# Patient Record
Sex: Female | Born: 1945 | Race: White | Hispanic: No | Smoking: Current every day smoker
Health system: Southern US, Community
[De-identification: ages and names within clinical notes are randomized; demographics above are authoritative.]

## PROBLEM LIST (undated history)

## (undated) DIAGNOSIS — D649 Anemia, unspecified: Secondary | ICD-10-CM

## (undated) DIAGNOSIS — D329 Benign neoplasm of meninges, unspecified: Secondary | ICD-10-CM

## (undated) DIAGNOSIS — K2970 Gastritis, unspecified, without bleeding: Secondary | ICD-10-CM

## (undated) DIAGNOSIS — I739 Peripheral vascular disease, unspecified: Secondary | ICD-10-CM

## (undated) DIAGNOSIS — I639 Cerebral infarction, unspecified: Secondary | ICD-10-CM

## (undated) DIAGNOSIS — I679 Cerebrovascular disease, unspecified: Secondary | ICD-10-CM

## (undated) DIAGNOSIS — K219 Gastro-esophageal reflux disease without esophagitis: Secondary | ICD-10-CM

## (undated) DIAGNOSIS — Z972 Presence of dental prosthetic device (complete) (partial): Secondary | ICD-10-CM

## (undated) DIAGNOSIS — I714 Abdominal aortic aneurysm, without rupture, unspecified: Secondary | ICD-10-CM

## (undated) DIAGNOSIS — E039 Hypothyroidism, unspecified: Secondary | ICD-10-CM

## (undated) DIAGNOSIS — I10 Essential (primary) hypertension: Secondary | ICD-10-CM

## (undated) DIAGNOSIS — M1990 Unspecified osteoarthritis, unspecified site: Secondary | ICD-10-CM

## (undated) DIAGNOSIS — K599 Functional intestinal disorder, unspecified: Secondary | ICD-10-CM

## (undated) DIAGNOSIS — K2980 Duodenitis without bleeding: Secondary | ICD-10-CM

## (undated) DIAGNOSIS — Z973 Presence of spectacles and contact lenses: Secondary | ICD-10-CM

## (undated) DIAGNOSIS — R9431 Abnormal electrocardiogram [ECG] [EKG]: Secondary | ICD-10-CM

## (undated) DIAGNOSIS — K3533 Acute appendicitis with perforation and localized peritonitis, with abscess: Secondary | ICD-10-CM

## (undated) DIAGNOSIS — F329 Major depressive disorder, single episode, unspecified: Secondary | ICD-10-CM

## (undated) DIAGNOSIS — I2510 Atherosclerotic heart disease of native coronary artery without angina pectoris: Secondary | ICD-10-CM

## (undated) DIAGNOSIS — L409 Psoriasis, unspecified: Secondary | ICD-10-CM

## (undated) DIAGNOSIS — F319 Bipolar disorder, unspecified: Secondary | ICD-10-CM

## (undated) DIAGNOSIS — F32A Depression, unspecified: Secondary | ICD-10-CM

## (undated) HISTORY — DX: Gastro-esophageal reflux disease without esophagitis: K21.9

## (undated) HISTORY — DX: Psoriasis, unspecified: L40.9

## (undated) HISTORY — DX: Abnormal electrocardiogram (ECG) (EKG): R94.31

## (undated) HISTORY — DX: Major depressive disorder, single episode, unspecified: F32.9

## (undated) HISTORY — DX: Cerebral infarction, unspecified: I63.9

## (undated) HISTORY — DX: Essential (primary) hypertension: I10

## (undated) HISTORY — DX: Gastritis, unspecified, without bleeding: K29.70

## (undated) HISTORY — DX: Duodenitis without bleeding: K29.80

## (undated) HISTORY — PX: MULTIPLE TOOTH EXTRACTIONS: SHX2053

## (undated) HISTORY — DX: Bipolar disorder, unspecified: F31.9

## (undated) HISTORY — PX: TUBAL LIGATION: SHX77

## (undated) HISTORY — PX: COLONOSCOPY: SHX174

## (undated) HISTORY — DX: Peripheral vascular disease, unspecified: I73.9

## (undated) HISTORY — DX: Depression, unspecified: F32.A

## (undated) HISTORY — PX: CATARACT EXTRACTION W/ INTRAOCULAR LENS  IMPLANT, BILATERAL: SHX1307

## (undated) HISTORY — DX: Functional intestinal disorder, unspecified: K59.9

## (undated) HISTORY — DX: Benign neoplasm of meninges, unspecified: D32.9

## (undated) HISTORY — DX: Hypothyroidism, unspecified: E03.9

---

## 2005-03-07 ENCOUNTER — Ambulatory Visit: Payer: Self-pay | Admitting: Internal Medicine

## 2005-03-21 ENCOUNTER — Ambulatory Visit: Payer: Self-pay | Admitting: Internal Medicine

## 2005-05-01 ENCOUNTER — Ambulatory Visit: Payer: Self-pay

## 2005-05-08 ENCOUNTER — Other Ambulatory Visit: Admission: RE | Admit: 2005-05-08 | Discharge: 2005-05-08 | Payer: Self-pay | Admitting: Obstetrics & Gynecology

## 2005-05-17 ENCOUNTER — Encounter: Admission: RE | Admit: 2005-05-17 | Discharge: 2005-05-17 | Payer: Self-pay | Admitting: Internal Medicine

## 2005-07-12 ENCOUNTER — Ambulatory Visit: Payer: Self-pay | Admitting: Internal Medicine

## 2006-08-30 ENCOUNTER — Ambulatory Visit: Payer: Self-pay | Admitting: Internal Medicine

## 2006-08-30 ENCOUNTER — Inpatient Hospital Stay (HOSPITAL_COMMUNITY): Admission: EM | Admit: 2006-08-30 | Discharge: 2006-08-31 | Payer: Self-pay | Admitting: Emergency Medicine

## 2006-08-30 ENCOUNTER — Ambulatory Visit: Payer: Self-pay | Admitting: Cardiology

## 2006-09-03 ENCOUNTER — Ambulatory Visit: Payer: Self-pay | Admitting: Internal Medicine

## 2006-09-17 ENCOUNTER — Ambulatory Visit: Payer: Self-pay | Admitting: Gastroenterology

## 2006-09-20 ENCOUNTER — Ambulatory Visit: Payer: Self-pay | Admitting: Cardiology

## 2006-10-05 ENCOUNTER — Ambulatory Visit: Payer: Self-pay | Admitting: Internal Medicine

## 2007-03-08 ENCOUNTER — Ambulatory Visit: Payer: Self-pay | Admitting: Internal Medicine

## 2007-03-08 DIAGNOSIS — I10 Essential (primary) hypertension: Secondary | ICD-10-CM | POA: Insufficient documentation

## 2007-03-08 DIAGNOSIS — F329 Major depressive disorder, single episode, unspecified: Secondary | ICD-10-CM

## 2007-03-08 DIAGNOSIS — F32A Depression, unspecified: Secondary | ICD-10-CM | POA: Insufficient documentation

## 2007-10-28 ENCOUNTER — Ambulatory Visit: Payer: Self-pay | Admitting: Internal Medicine

## 2008-01-09 ENCOUNTER — Ambulatory Visit: Payer: Self-pay | Admitting: Internal Medicine

## 2008-01-10 LAB — CONVERTED CEMR LAB
Basophils Relative: 0.7 % (ref 0.0–1.0)
Lymphocytes Relative: 35.6 % (ref 12.0–46.0)
Monocytes Relative: 13.9 % — ABNORMAL HIGH (ref 3.0–12.0)
Neutro Abs: 2.4 10*3/uL (ref 1.4–7.7)
RDW: 13.4 % (ref 11.5–14.6)

## 2008-01-14 ENCOUNTER — Encounter (INDEPENDENT_AMBULATORY_CARE_PROVIDER_SITE_OTHER): Payer: Self-pay | Admitting: *Deleted

## 2008-01-16 ENCOUNTER — Telehealth (INDEPENDENT_AMBULATORY_CARE_PROVIDER_SITE_OTHER): Payer: Self-pay | Admitting: *Deleted

## 2008-01-27 ENCOUNTER — Telehealth: Payer: Self-pay | Admitting: Internal Medicine

## 2008-01-28 ENCOUNTER — Ambulatory Visit (HOSPITAL_COMMUNITY): Admission: RE | Admit: 2008-01-28 | Discharge: 2008-01-28 | Payer: Self-pay | Admitting: Internal Medicine

## 2008-01-28 DIAGNOSIS — R110 Nausea: Secondary | ICD-10-CM

## 2008-01-29 DIAGNOSIS — K5909 Other constipation: Secondary | ICD-10-CM | POA: Insufficient documentation

## 2008-01-30 ENCOUNTER — Ambulatory Visit: Payer: Self-pay | Admitting: Internal Medicine

## 2008-02-06 ENCOUNTER — Ambulatory Visit: Payer: Self-pay | Admitting: Internal Medicine

## 2008-02-07 ENCOUNTER — Encounter: Admission: RE | Admit: 2008-02-07 | Discharge: 2008-02-07 | Payer: Self-pay | Admitting: Internal Medicine

## 2008-02-10 ENCOUNTER — Telehealth: Payer: Self-pay | Admitting: Internal Medicine

## 2008-03-30 ENCOUNTER — Telehealth: Payer: Self-pay | Admitting: Internal Medicine

## 2008-08-11 ENCOUNTER — Ambulatory Visit: Payer: Self-pay | Admitting: Family Medicine

## 2009-08-02 ENCOUNTER — Ambulatory Visit: Payer: Self-pay | Admitting: Internal Medicine

## 2009-08-02 LAB — CONVERTED CEMR LAB: TSH: 2.13 microintl units/mL (ref 0.35–5.50)

## 2009-10-04 ENCOUNTER — Encounter: Payer: Self-pay | Admitting: Internal Medicine

## 2009-10-04 ENCOUNTER — Telehealth: Payer: Self-pay | Admitting: Internal Medicine

## 2009-11-05 ENCOUNTER — Encounter: Payer: Self-pay | Admitting: Internal Medicine

## 2009-11-05 ENCOUNTER — Encounter: Admission: RE | Admit: 2009-11-05 | Discharge: 2009-11-05 | Payer: Self-pay | Admitting: Internal Medicine

## 2009-11-08 ENCOUNTER — Telehealth: Payer: Self-pay | Admitting: Internal Medicine

## 2009-11-22 ENCOUNTER — Ambulatory Visit: Payer: Self-pay | Admitting: Internal Medicine

## 2009-11-22 DIAGNOSIS — M549 Dorsalgia, unspecified: Secondary | ICD-10-CM | POA: Insufficient documentation

## 2009-11-23 ENCOUNTER — Ambulatory Visit: Payer: Self-pay | Admitting: Internal Medicine

## 2009-11-29 ENCOUNTER — Encounter: Payer: Self-pay | Admitting: Internal Medicine

## 2009-11-29 ENCOUNTER — Telehealth (INDEPENDENT_AMBULATORY_CARE_PROVIDER_SITE_OTHER): Payer: Self-pay | Admitting: *Deleted

## 2009-12-01 ENCOUNTER — Telehealth (INDEPENDENT_AMBULATORY_CARE_PROVIDER_SITE_OTHER): Payer: Self-pay | Admitting: *Deleted

## 2009-12-01 ENCOUNTER — Telehealth: Payer: Self-pay | Admitting: Internal Medicine

## 2009-12-02 ENCOUNTER — Ambulatory Visit: Payer: Self-pay | Admitting: Internal Medicine

## 2009-12-02 DIAGNOSIS — R519 Headache, unspecified: Secondary | ICD-10-CM | POA: Insufficient documentation

## 2009-12-02 DIAGNOSIS — R51 Headache: Secondary | ICD-10-CM

## 2009-12-03 ENCOUNTER — Encounter: Admission: RE | Admit: 2009-12-03 | Discharge: 2009-12-03 | Payer: Self-pay | Admitting: Internal Medicine

## 2009-12-10 ENCOUNTER — Ambulatory Visit: Payer: Self-pay | Admitting: Internal Medicine

## 2009-12-13 LAB — CONVERTED CEMR LAB
Creatinine, Ser: 0.8 mg/dL (ref 0.4–1.2)
GFR calc non Af Amer: 76.24 mL/min (ref >60)
Glucose, Bld: 81 mg/dL (ref 70–99)
Sodium: 141 meq/L (ref 135–145)

## 2009-12-16 ENCOUNTER — Encounter: Admission: RE | Admit: 2009-12-16 | Discharge: 2009-12-16 | Payer: Self-pay | Admitting: Internal Medicine

## 2009-12-23 ENCOUNTER — Encounter: Payer: Self-pay | Admitting: Internal Medicine

## 2009-12-23 ENCOUNTER — Telehealth: Payer: Self-pay | Admitting: Internal Medicine

## 2009-12-28 ENCOUNTER — Telehealth (INDEPENDENT_AMBULATORY_CARE_PROVIDER_SITE_OTHER): Payer: Self-pay | Admitting: *Deleted

## 2009-12-28 ENCOUNTER — Encounter: Payer: Self-pay | Admitting: Internal Medicine

## 2010-01-03 ENCOUNTER — Telehealth (INDEPENDENT_AMBULATORY_CARE_PROVIDER_SITE_OTHER): Payer: Self-pay | Admitting: *Deleted

## 2010-01-06 ENCOUNTER — Encounter (INDEPENDENT_AMBULATORY_CARE_PROVIDER_SITE_OTHER): Payer: Self-pay | Admitting: *Deleted

## 2010-01-10 ENCOUNTER — Telehealth (INDEPENDENT_AMBULATORY_CARE_PROVIDER_SITE_OTHER): Payer: Self-pay | Admitting: *Deleted

## 2010-01-11 ENCOUNTER — Encounter: Payer: Self-pay | Admitting: Internal Medicine

## 2010-01-13 ENCOUNTER — Telehealth: Payer: Self-pay | Admitting: Internal Medicine

## 2010-01-14 ENCOUNTER — Encounter: Admission: RE | Admit: 2010-01-14 | Discharge: 2010-01-14 | Payer: Self-pay | Admitting: Neurology

## 2010-01-19 ENCOUNTER — Ambulatory Visit: Payer: Self-pay | Admitting: Internal Medicine

## 2010-01-19 LAB — CONVERTED CEMR LAB
LDL Cholesterol: 114 mg/dL — ABNORMAL HIGH (ref 0–99)
Total CHOL/HDL Ratio: 3
VLDL: 9.4 mg/dL (ref 0.0–40.0)

## 2010-01-21 ENCOUNTER — Encounter: Payer: Self-pay | Admitting: Internal Medicine

## 2010-01-21 ENCOUNTER — Telehealth: Payer: Self-pay | Admitting: Internal Medicine

## 2010-04-11 HISTORY — PX: OTHER SURGICAL HISTORY: SHX169

## 2010-04-14 ENCOUNTER — Ambulatory Visit: Payer: Self-pay | Admitting: Family Medicine

## 2010-04-19 LAB — CONVERTED CEMR LAB: Thyroglobulin Ab: <20 (ref <40.0)

## 2010-07-12 ENCOUNTER — Telehealth: Payer: Self-pay | Admitting: Internal Medicine

## 2010-07-25 ENCOUNTER — Telehealth (INDEPENDENT_AMBULATORY_CARE_PROVIDER_SITE_OTHER): Payer: Self-pay | Admitting: *Deleted

## 2010-08-12 ENCOUNTER — Telehealth: Payer: Self-pay | Admitting: Internal Medicine

## 2010-08-15 ENCOUNTER — Encounter: Payer: Self-pay | Admitting: Internal Medicine

## 2010-08-15 ENCOUNTER — Ambulatory Visit: Payer: Self-pay | Admitting: Internal Medicine

## 2010-08-15 DIAGNOSIS — R079 Chest pain, unspecified: Secondary | ICD-10-CM | POA: Insufficient documentation

## 2010-08-15 DIAGNOSIS — I6529 Occlusion and stenosis of unspecified carotid artery: Secondary | ICD-10-CM | POA: Insufficient documentation

## 2010-08-16 ENCOUNTER — Encounter: Payer: Self-pay | Admitting: Internal Medicine

## 2010-08-17 ENCOUNTER — Encounter: Payer: Self-pay | Admitting: Internal Medicine

## 2010-08-19 LAB — CONVERTED CEMR LAB
Basophils Relative: 0.3 % (ref 0.0–3.0)
Calcium: 9.2 mg/dL (ref 8.4–10.5)
Creatinine, Ser: 0.7 mg/dL (ref 0.4–1.2)
Direct LDL: 135.6 mg/dL
Eosinophils Absolute: 0.1 10*3/uL (ref 0.0–0.7)
Eosinophils Relative: 1.9 % (ref 0.0–5.0)
Glucose, Bld: 90 mg/dL (ref 70–99)
HDL: 64.4 mg/dL (ref >39.00)
Lymphocytes Relative: 35.9 % (ref 12.0–46.0)
Lymphs Abs: 2.1 10*3/uL (ref 0.7–4.0)
Monocytes Absolute: 0.5 10*3/uL (ref 0.1–1.0)
Neutro Abs: 3.1 10*3/uL (ref 1.4–7.7)
Platelets: 226 10*3/uL (ref 150.0–400.0)
Potassium: 4.5 meq/L (ref 3.5–5.1)
RDW: 14.8 % — ABNORMAL HIGH (ref 11.5–14.6)
Total CHOL/HDL Ratio: 4
Triglycerides: 96 mg/dL (ref 0.0–149.0)
WBC: 5.8 10*3/uL (ref 4.5–10.5)

## 2010-08-24 ENCOUNTER — Encounter: Payer: Self-pay | Admitting: Internal Medicine

## 2010-08-29 ENCOUNTER — Encounter (HOSPITAL_COMMUNITY): Admission: RE | Admit: 2010-08-29 | Payer: Self-pay | Source: Home / Self Care | Admitting: Internal Medicine

## 2010-08-29 ENCOUNTER — Ambulatory Visit: Payer: Self-pay

## 2010-09-20 ENCOUNTER — Telehealth: Payer: Self-pay | Admitting: Internal Medicine

## 2010-09-20 ENCOUNTER — Encounter (INDEPENDENT_AMBULATORY_CARE_PROVIDER_SITE_OTHER): Payer: Self-pay | Admitting: *Deleted

## 2010-09-23 ENCOUNTER — Ambulatory Visit
Admission: RE | Admit: 2010-09-23 | Discharge: 2010-09-23 | Payer: Self-pay | Source: Home / Self Care | Attending: Internal Medicine | Admitting: Internal Medicine

## 2010-09-23 DIAGNOSIS — M1990 Unspecified osteoarthritis, unspecified site: Secondary | ICD-10-CM | POA: Insufficient documentation

## 2010-10-02 ENCOUNTER — Encounter: Payer: Self-pay | Admitting: Internal Medicine

## 2010-10-09 LAB — CONVERTED CEMR LAB
ALT: 15 units/L
Alkaline Phosphatase: 60 units/L
Hemoglobin: 12.2 g/dL
RBC count: 4.15 10*6/uL
WBC, blood: 5.2 10*3/uL
platelet count: 186 10*3/uL

## 2010-10-11 NOTE — Progress Notes (Signed)
  Phone Note Other Incoming   Summary of Call: Patient appeared requesting copies of her MRI's, [facility] Radiation protection practitioner. Discs were comprised of MRA NECK, MR BRAIN, MRA HEAD, DG LUMBAR SPINE COMPLETE AND CT VIRTUAL COLONSCOPY. Patient picked up.

## 2010-10-11 NOTE — Assessment & Plan Note (Signed)
Summary: pain on right/kdc   Vital Signs:  Patient profile:   65 year old female Height:      69 inches Weight:      188.4 pounds BMI:     27.92 Temp:     98 degrees F BP sitting:   140 / 80  Vitals Entered By: Shary Decamp (November 22, 2009 4:08 PM) CC: low back pain on rt side, down rt leg off & on x 2 months - worse x few weeks   History of Present Illness: two  months history of on-and-off pain on the right lower back pain is worse when she first gets up, as the day goes by,  pain decreases pain radiates somehow to the right knee and right inner pretibial area no injury or previous surgery in the back in the past she have left sided back pain but no right-sided pain  Current Medications (verified): 1)  Depakote 500 Mg Tbec (Divalproex Sodium) .... 2 By Mouth Once Daily 2)  Cymbalta 60 Mg Cpep (Duloxetine Hcl) .... Take 1 Capsule By Mouth Once A Day 3)  Clonazepam 2 Mg Tabs (Clonazepam) .Marland Kitchen.. 1 Tablet By Mouth At Bedtime 4)  Requip 5)  Seroquel .... 1/4 At At Bedtime  Allergies (verified): No Known Drug Allergies  Past History:  Past Medical History: Depression-- sees psych Hypertension  Past Surgical History: Reviewed history from 01/29/2008 and no changes required. Tubal Ligation  Social History: Reviewed history from 08/02/2009 and no changes required. Occupation: Self-Employed quit tobacco June 2010 aprox, on the E-cigarrett  Alcohol Use - yes-on occasion Daily Caffeine Use  Review of Systems       denies bladder or bowel incontinence no rash anywhere in the back no lower extremity paresthesias  Physical Exam  General:  alert, well-developed, and well-nourished.   Msk:  slightly tender to palpation in the right sacroiliac area Extremities:  no edema Neurologic:  strength normal in the lower extremities reflexes symmetric in the lower extremities, ankle jerk slightly decreased on the right straight leg test negative   Impression &  Recommendations:  Problem # 1:  BACK PAIN (ICD-724.5) Assessment New subacute right back pain, some radicular features Plan: X-ray pain control  see instructions   ortho referral    Orders: T-Lumbar Spine Complete, 5 Views (16109UE) Orthopedic Referral (Ortho)  Complete Medication List: 1)  Depakote 500 Mg Tbec (Divalproex sodium) .... 2 by mouth once daily 2)  Cymbalta 60 Mg Cpep (Duloxetine hcl) .... Take 1 capsule by mouth once a day 3)  Clonazepam 2 Mg Tabs (Clonazepam) .Marland Kitchen.. 1 tablet by mouth at bedtime 4)  Requip  5)  Seroquel  .... 1/4 at at bedtime  Patient Instructions: 1)  Take  1000 mg of tylenol every 6 hours as needed for relief of pain  2)  Avoid taking more than 4000 mg in a 24 hour period( can cause liver damage in higher doses) 3)  XR

## 2010-10-11 NOTE — Miscellaneous (Signed)
Summary: Virtual Colonoscopy Order  Clinical Lists Changes  Orders: Added new Test order of Virtual Colon (Virt Col) - Signed

## 2010-10-11 NOTE — Progress Notes (Signed)
Summary: cholesterol: Start simvastatin check LFTs  Phone Note Outgoing Call   Summary of Call: cholesterol panel reviewed, LDL is 114, goal 100. Needs simvastatin 20 mg one p.o. q.h.s. Check LFTs before she starts simvastatin Fax results to neurology OV  in 6 weeks Geno Sydnor E. Taria Castrillo MD  Jan 21, 2010 11:28 AM   Follow-up for Phone Call        discussed with pt -- copy faxed to Dr. Clarisse Gouge -- [phone], pt states she had LFTs done @ Derm.  Copy of labs requested COPY MAILED TO PT [name]  Jan 21, 2010 11:39 AM     New/Updated Medications: SIMVASTATIN 20 MG TABS (SIMVASTATIN) 1 by mouth at bedtime - DUE OFFICE VISIT WITH DR Drue Novel 7/11 Prescriptions: SIMVASTATIN 20 MG TABS (SIMVASTATIN) 1 by mouth at bedtime - DUE OFFICE VISIT WITH DR Drue Novel 7/11  #30 x 2   Entered by:   [name]   Authorized by:   Nolon Rod. Beckhem Isadore MD   Signed by:   [name] on 01/21/2010   Method used:   Electronically to        [id number]* (retail)       [address]       [place], [place]  [address]       Ph: [phone]       Fax: [phone]   RxID:   [id number]

## 2010-10-11 NOTE — Assessment & Plan Note (Signed)
Summary: CPX//ph   Vital Signs:  Patient profile:   65 year old female Weight:      184.50 pounds Pulse rate:   61 / minute Pulse rhythm:   regular BP sitting:   128 / 82  (left arm) Cuff size:   large  Vitals Entered By: Army Fossa CMA (August 15, 2010 1:17 PM) CC: Pt here a f/u. - fasting  Comments c.o dizziness and nausea x 6 months referral to gyn Fasting [facility] [address]    CC:  Pt here a f/u. - fasting .  History of Present Illness: Here for Medicare AWV:  1.   Risk factors based on Past M, S, F history: reviewed  2.   Physical Activities: not very active, had knee surgery  ~ 04-2010 but still walks the dog 1 mile a                day  3.   Depression/mood: symptoms are not as well controlled recently , sees  psychiatry  4.   Hearing: hearing very good  5.   ADL's: totally independent  6.   Fall Risk: no recent falls, low risk  7.   Home Safety: does feel safe at home  8.   Height, weight, &visual acuity: see VS, uses glasses but vision not good ,  needs to see the                      eye doctor    d/t  cataracts  9.   Counseling:  see a/p  10.   Labs ordered based on risk factors: yes  11.           Referral Coordination, if needed  12.           Care Plan, see a/p  13.            Cognitive Assessment-- motor skill , memory and cognition seem appropiate   in addition we discussed the folowing: dizziness --  occasionally dizzy when she stands up, "need to hold onto something" nausea-- chromic per patient "all my life", states that phenergan  helps, requests a refill HTN-- ambulatory BPs  better than before , occasionally goes to 140 skin psoriasis, sees Dr Londell Moh, on MTX    Current Medications (verified): 1)  Depakote 500 Mg Tbec (Divalproex Sodium) .... 2 By Mouth Once Daily 2)  Cymbalta 60 Mg Cpep (Duloxetine Hcl) .... Take 1 Capsule By Mouth Once A Day 3)  Clonazepam 2 Mg Tabs (Clonazepam) .Marland Kitchen.. 1 Tablet By Mouth At Bedtime 4)  Requip 5)   Promethazine Hcl 25 Mg  Tabs (Promethazine Hcl) .... Take 1 Q6 As Needed For Nausea/vomiting 6)  Methotrexate 2.5 Mg Tabs (Methotrexate Sodium)  Allergies (verified): No Known Drug Allergies  Past History:  Past Medical History: Depression-- sees psych Hypertension chronic constipation  skin psoriasis , sees derm  Carotid artery dz:  intra-and extracranial  vascular dz per MRI  4/11 ,  neurology rec. strict CVRF control 2006-[place]: had EKG changes, stress test negative (" false EKG changes")  Past Surgical History: Tubal Ligation R knee arthroscopy  ~ 8-11   Family History: Breast Cancer: Mother (Metastisis to bones) colon ca--no  Diabetes: Son Heart Disease: GF  strokes--no  Alcoholism: Brother    Social History: married retired 1 son  quit tobacco June 2010 aprox, on the E-cigarrett  Alcohol Use - yes-on occasion Daily Caffeine Use  Review of Systems CV:  Denies swelling of feet; 2 episodes of chest pain in the last 2 months. Described as "somebody sitting in my chest", some radiation to the left arm, usually triggered by emotional distress, not exertional, not associated with nausea, diaphoresis. It is associated with headache. Episodes last a few minutes. Resp:  Denies cough and shortness of breath. GI:  no heartburn no dysphagia or odynophagia .  Physical Exam  General:  alert and well-developed.   Chest Wall:  no deformities and no tenderness.   Lungs:  normal respiratory effort, no intercostal retractions, no accessory muscle use, and normal breath sounds.   Heart:  normal rate, regular rhythm, and no murmur.   Abdomen:  soft, non-tender, no distention, no masses, no guarding, and no rigidity.   Extremities:  no lower extremity edema   Impression & Recommendations:  Problem # 1:  ROUTINE GENERAL MEDICAL EXAM@HEALTH  CARE FACL (ICD-V70.0) Td-- not in years , declined pneumonia shot -- never no flu shot shingles shot discussed  explained benefits of  shots-----declined all ,  except  shingles   Cscope--- per GI   had a DEXA years ago, results?------ordering one  needs gyn referal,  last PAP years ago last MMG years ago ----------------ordering one  diet and exercise discussed  Orders: Radiology Referral (Radiology) Gynecologic Referral (Gyn) Radiology Referral (Radiology) Medicare -1st Annual Wellness Visit [phone])  Problem # 2:  CHEST PAIN (ICD-786.50) Assessment: New 65 year old female with  chest pain  EKG-- nsr  Chest x-ray start aspirin ER if symptoms severe Stress test  Orders: T-2 View CXR (71020TC) EKG w/ Interpretation (93000) Cardiology Referral (Cardiology)  Problem # 3:  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 Saw  neurology 5-11, they recommend cardiovascular risk factor control  Her updated medication list for this problem includes:    Aspirin 81 Mg Tbec (Aspirin) .Marland Kitchen... 1 by mouth daily  Orders: TLB-BMP (Basic Metabolic Panel-BMET) (80048-METABOL) TLB-Lipid Panel (80061-LIPID) Specimen Handling (09811)  Problem # 4:  HYPERTENSION (ICD-401.9) history of hypertension with normal BPs BP today: 128/82 Prior BP: 124/72 (04/14/2010)  Labs Reviewed: K+: 3.6 (12/10/2009) Creat: : 0.8 (12/10/2009)   Chol: 199 (01/19/2010)   HDL: 75.60 (01/19/2010)   LDL: 114 (01/19/2010)   TG: 47.0 (01/19/2010)  Orders: Venipuncture (91478) TLB-CBC Platelet - w/Differential (85025-CBCD)  Problem # 5:  NAUSEA (ICD-787.02) chronic nausea, Phenergan helps. I agreed to prescribe her very small doses Her updated medication list for this problem includes:    Promethazine Hcl 25 Mg Tabs (Promethazine hcl) .Marland Kitchen... Take 1 q6 as needed for nausea/vomiting  Problem # 6:  in addition to her CPX  we spent > 30 min w/ patient due to CP and also reviewing her chart   Complete Medication List: 1)  Depakote 500 Mg Tbec (Divalproex sodium) .... 2 by mouth once daily 2)  Cymbalta 60 Mg Cpep  (Duloxetine hcl) .... Take 1 capsule by mouth once a day 3)  Clonazepam 2 Mg Tabs (Clonazepam) .Marland Kitchen.. 1 tablet by mouth at bedtime 4)  Requip  5)  Promethazine Hcl 25 Mg Tabs (Promethazine hcl) .... Take 1 q6 as needed for nausea/vomiting 6)  Methotrexate 2.5 Mg Tabs (Methotrexate sodium) 7)  Aspirin 81 Mg Tbec (Aspirin) .Marland Kitchen.. 1 by mouth daily  Other Orders: T-Vitamin D (25-Hydroxy) [phone]) Zoster (Shingles) Vaccine Live [phone]) Admin 1st Vaccine (96295)  Patient Instructions: 1)  Please schedule a follow-up appointment in 4 months .  Prescriptions: PROMETHAZINE HCL 25 MG  TABS (PROMETHAZINE HCL) take 1  Q6 as needed for nausea/vomiting  #40 x 0   Entered by:   Lucious Groves CMA   Authorized by:   Nolon Rod. Paz MD   Signed by:   Lucious Groves CMA on 08/15/2010   Method used:   Telephoned to ...       [facility] [phone]* (retail)       [address]       [place], [place]  [address]       Ph: [phone]       Fax: [phone]   RxID:   [id number]    Orders Added: 1)  Venipuncture [59563] 2)  TLB-CBC Platelet - w/Differential [85025-CBCD] 3)  TLB-BMP (Basic Metabolic Panel-BMET) [80048-METABOL] 4)  TLB-Lipid Panel [80061-LIPID] 5)  T-Vitamin D (25-Hydroxy) [87564-33295] 6)  T-2 View CXR [71020TC] 7)  Specimen Handling [99000] 8)  Zoster (Shingles) Vaccine Live [90736] 9)  Admin 1st Vaccine [90471] 10)  EKG w/ Interpretation [93000] 11)  Cardiology Referral [Cardiology] 12)  Radiology Referral [Radiology] 13)  Gynecologic Referral [Gyn] 14)  Radiology Referral [Radiology] 15)  Est. Patient Level IV [18841] 16)  Medicare -1st Annual Wellness Visit [G0438]   Immunization History:  Influenza Immunization History:    Influenza:  declined , explained benefits (08/15/2010)  Immunizations Administered:  Zostavax # 1:    Vaccine Type: Zostavax    Site: right deltoid    Mfr: Merck    Dose: 0.94ml    Route: [place]    Given  by: Army Fossa CMA    Exp. Date: 07/13/2011    Lot #: [id number]   Immunization History:  Influenza Immunization History:    Influenza:  declined , explained benefits (08/15/2010)  Immunizations Administered:  Zostavax # 1:    Vaccine Type: Zostavax    Site: right deltoid    Mfr: Merck    Dose: 0.34ml    Route: [place]    Given by: Army Fossa CMA    Exp. Date: 07/13/2011    Lot #: [id number]

## 2010-10-11 NOTE — Assessment & Plan Note (Signed)
Summary: possible food poision/paz pt//kn   Vital Signs:  Patient profile:   65 year old female O2 Sat:      93 % on Room air Temp:     98.0 degrees F oral BP sitting:   124 / 72  (left arm)  Vitals Entered By: Doristine Devoid CMA (April 14, 2010 11:53 AM)  O2 Flow:  Room air CC: vomiting xyest. after eating [place] sandwich with nausea and stomach pains    History of Present Illness: 65 yo woman here today w/ vomiting.  went to [facility] Tuesday yesterday and had a [place] Burger and 'rotten' lemon in iced tea.  90 minutes after eating felt urge to both vomit and have diarrhea.  had diarrhea (but also took a laxative).  made herself vomit, had vomiting x3.  'excrutiating' abd pain.  nausea persisted throughout the day.  unable to sleep last night.  this AM, 'i'm ok except for a little headache'.  ate eggs this AM and has kept this down.  no fevers.    Saw MD in [place] and has letter that Thyroid and blood sugars are 'borderline'.  MD recommended thyroid antibody test for TSH >2.5 w/ hypothyroid sxs of fatigue, constipation, thinning hair, mood swings/depressed mood.  pt's TSH was 3.109  Current Medications (verified): 1)  Depakote 500 Mg Tbec (Divalproex Sodium) .... 2 By Mouth Once Daily 2)  Cymbalta 60 Mg Cpep (Duloxetine Hcl) .... Take 1 Capsule By Mouth Once A Day 3)  Clonazepam 2 Mg Tabs (Clonazepam) .Marland Kitchen.. 1 Tablet By Mouth At Bedtime 4)  Requip 5)  Seroquel .... 1/4 At At Bedtime 6)  Simvastatin 20 Mg Tabs (Simvastatin) .Marland Kitchen.. 1 By Mouth At Bedtime - Due Office Visit With Dr Drue Novel 7/11 7)  Promethazine Hcl 25 Mg  Tabs (Promethazine Hcl) .... Take 1 Q6 As Needed For Nausea/vomiting  Allergies (verified): No Known Drug Allergies  Review of Systems      See HPI  Physical Exam  General:  alert and well-developed.   Mouth:  MMM Neck:  fullness to neck w/out obvious nodule Lungs:  Normal respiratory effort, chest expands symmetrically. Lungs are clear to auscultation, no crackles or  wheezes. Heart:  reg S1/S2 Abdomen:  soft, NT/ND, +BS, no rebound/guarding   Impression & Recommendations:  Problem # 1:  VOMITING (ICD-787.03) Assessment New  pt's sxs more consistent w/ viral illness than food poisoning.  illness is resolving spontaneously- pt tolerating oral intake and no longer vomiting, no diarrhea.  no evidence of dehydration.  reviewed supportive care and red flags that should prompt return.  Pt expresses understanding and is in agreement w/ this plan.  Orders: Prescription Created Electronically [phone])  Problem # 2:  THYROMEGALY (ICD-240.9) Assessment: New given pt's generalized neck fullness and her discussion w/ MD in [place], will order labs pt desires. Orders: Venipuncture ([address]) T- * Misc. Laboratory test [phone]) Specimen Handling (22025)  Complete Medication List: 1)  Depakote 500 Mg Tbec (Divalproex sodium) .... 2 by mouth once daily 2)  Cymbalta 60 Mg Cpep (Duloxetine hcl) .... Take 1 capsule by mouth once a day 3)  Clonazepam 2 Mg Tabs (Clonazepam) .Marland Kitchen.. 1 tablet by mouth at bedtime 4)  Requip  5)  Seroquel  .... 1/4 at at bedtime 6)  Simvastatin 20 Mg Tabs (Simvastatin) .Marland Kitchen.. 1 by mouth at bedtime - due office visit with dr Drue Novel 7/11 7)  Promethazine Hcl 25 Mg Tabs (Promethazine hcl) .... Take 1 q6 as needed for nausea/vomiting  Patient Instructions: 1)  This doesn't appear to be Salmonella (thank goodness!) 2)  Make sure you are drinking plenty of fluids to avoid dehydration 3)  Use the Promethazine as needed for nausea 4)  If your symptoms change or worsen (blood in stool, fever, unstoppable vomiting)- please call 5)  Hang in there! Prescriptions: PROMETHAZINE HCL 25 MG  TABS (PROMETHAZINE HCL) take 1 Q6 as needed for nausea/vomiting  #20 x 0   Entered and Authorized by:   Neena Rhymes MD   Signed by:   Neena Rhymes MD on 04/14/2010   Method used:   Electronically to        [facility] #[id number]* (retail)       [address]       [place], [place]  [address]       Ph: [phone]       Fax: [phone]   RxID:   [id number]

## 2010-10-11 NOTE — Assessment & Plan Note (Signed)
Summary: pain/swh   Vital Signs:  Patient profile:   65 year old female Height:      69 inches Weight:      184.4 pounds BMI:     27.33 Pulse rate:   66 / minute BP sitting:   150 / 90  Vitals Entered By: R.Peeler CC: back/neck pain Comments sharp back/neck/head pain when bears down for BM-x 3 mnths lasts 30 sec.-"unbearabble" back of head is sore, tender to touch   History of Present Illness: here today because for the last 3 months, she has noted severe headaches when is straining with a bowel movement:  tipycally the pain starts at  the  upper, posterior, left neck and then involves the whole head;  it is quite intense, last 30 seconds and then goes  away the pain does not radiate to her upper extremities other activities that create a  Valsalva maneuver may trigger the headache as well  review of systems denies nausea or vomiting no upper or lower extremity paresthesias no loss of consciousness  Allergies: No Known Drug Allergies  Past History:  Past Medical History: Reviewed history from 11/22/2009 and no changes required. Depression-- sees psych Hypertension  Past Surgical History: Reviewed history from 01/29/2008 and no changes required. Tubal Ligation  Social History: Reviewed history from 11/22/2009 and no changes required. Occupation: Self-Employed quit tobacco June 2010 aprox, on the E-cigarrett  Alcohol Use - yes-on occasion Daily Caffeine Use  Physical Exam  General:  alert and well-developed.   Neck:  essentially full range of motion Extremities:  not edema Neurologic:  alert & oriented X3, cranial nerves II-XII intact, strength normal in all extremities, gait normal, and DTRs symmetrical and normal.     Impression & Recommendations:  Problem # 1:  HEADACHE (ICD-784.0)  the patient has any onset of headache associated with a Valsalva maneuvers serious  underlying etiologies need to be ruled out, doubt she has a primary neck  pathology recommend a brain MRI/MRA if the MRI is negative and the symptoms persist, consider neurology referral if sx  severe, patient to let me know  Orders: Radiology Referral (Radiology)  Complete Medication List: 1)  Depakote 500 Mg Tbec (Divalproex sodium) .... 2 by mouth once daily 2)  Cymbalta 60 Mg Cpep (Duloxetine hcl) .... Take 1 capsule by mouth once a day 3)  Clonazepam 2 Mg Tabs (Clonazepam) .Marland Kitchen.. 1 tablet by mouth at bedtime 4)  Requip  5)  Seroquel  .... 1/4 at at bedtime

## 2010-10-11 NOTE — Miscellaneous (Signed)
Summary: Appointment Canceled  Appointment status changed to canceled by [employer] on 08/16/2010 3:23 PM.  Cancellation Comments --------------------- crs. wt: 184. dx: chestpain.dr. Drue Novel. medicare,. bcbs. no precret. gd  Appointment Information ----------------------- Appt Type:  CARDIOLOGY NUCLEAR TESTING      Date:  Thursday, August 25, 2010      Time:  9:00 AM for 15 min   Urgency:  Routine   Made By:  Hoy Finlay Scheduler  To Visit:  [id number]    Reason:  crs. wt: 184. dx: chestpain.dr. Drue Novel. medicare,. bcbs. no precret. gd  Appt Comments ------------- -- 08/16/10 15:23: ([facility]EM[facility]) CANCELED -- crs. wt: 184. dx: chestpain.dr. Drue Novel. medicare,. bcbs. no precret. gd -- 08/16/10 14:05: (CEMR) BOOKED -- Routine CARDIOLOGY NUCLEAR TESTING at 08/25/2010 9:00 AM for 15 min crs. wt: 184. dx: chestpain.dr. Drue Novel. medic  Appended Document: Appointment Canceled if test was cancelled, call patient, I strongly rec to do the test. OV w/ me if needs further discussion  Appended Document: Appointment Canceled   unable to reach pt, will try later. Army Fossa CMA  August 18, 2010 11:03 AM  left message w/ pts husband for pt to call back. Army Fossa CMA  August 19, 2010 9:31 AM  I spoke with patient and she has already r/s the appt. Lucious Groves CMA  August 19, 2010 1:48 PM

## 2010-10-11 NOTE — Letter (Signed)
Summary: *Referral Letter  [facility]  [address] [place], [place] [address]   Phone: [phone]  Fax: [phone]    12/23/2009  NEUROLOGY   Thank you in advance for agreeing to see my patient:  [name] [address] [place], [place]  [address]  Phone: [phone]  Reason for Referral:  the patient was seen at the office  with an unusual headache, the headache is usually triggered by a Valsalva maneuver. We did an MRI/MRA of the head, it showed small vessel disease but also a question of a carotid artery stenoses.  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 I spoke with her radiology is about MRA, they think  is likely that the decreased caliber of the vertebral artery is an artifact however the patient continue with the episodes of headaches and dizziness consequently I will ask you to evaluate her for that.    Current Medications: 1)  DEPAKOTE 500 MG TBEC (DIVALPROEX SODIUM) 2 by mouth once daily 2)  CYMBALTA 60 MG CPEP (DULOXETINE HCL) Take 1 capsule by mouth once a day 3)  CLONAZEPAM 2 MG TABS (CLONAZEPAM) 1 tablet by mouth at bedtime 4)  * REQUIP  5)  * SEROQUEL 1/4 at at bedtime   Past Medical History: 1)  Depression-- sees psych 2)  Hypertension   Thank you again for agreeing to see our patient; please contact us if you have any further questions or need additional information.  Sincerely,      Demarlo Riojas E. Justus Duerr MD

## 2010-10-11 NOTE — Progress Notes (Signed)
  Phone Note Call from Patient   Caller: Spouse Summary of Call: Called wanted details of wife appt and ortho appt, informed spouse we  really would like to help, but due to HIPAA regulations we cannot discuss.  Pt was informed of appt times and date by myself and [facility] referral coordinator ,spent a great deal of time on phone with pt this am Initial call taken by: Kandice Hams,  December 01, 2009 4:02 PM

## 2010-10-11 NOTE — Miscellaneous (Signed)
Summary: labs from derm   Clinical Lists Changes  Observations: Added new observation of PROTEIN, TOT: 6.9 g/dL (16/06/9603 54:09) Added new observation of ALBUMIN: 4.1 g/dL (81/19/1478 29:56) Added new observation of BILI TOTAL: 0.4 mg/dL (21/30/8657 84:69) Added new observation of ALK PHOS: 60 units/L (12/28/2009 14:40) Added new observation of SGPT (ALT): 15 units/L (12/28/2009 14:40) Added new observation of SGOT (AST): 14 units/L (12/28/2009 14:40) Added new observation of PLATELET CNT: 186 10*3/microliter (12/28/2009 14:39) Added new observation of HCT: 37.3 % (12/28/2009 14:39) Added new observation of HGB: 12.2 g/dL (62/95/2841 32:44) Added new observation of RBC: 4.15 10*6/mm3 (12/28/2009 14:39) Added new observation of WBC: 5.2 10*3/mm3 (12/28/2009 14:39)      Complete Blood Count Test Date: 12/28/2009             Value   Units      H/L    Reference  WBC:       5.2   X 10^3/uL          (3.5-10.0) RBC:       4.15  X 10^6/uL          (3.60-5.00) Hgb:       12.2  g/dl               (01.0-27.2) Hct:       37.3  %                  (35.0-46.0) Platelets: 186   X 10^3/uL          (150-450)    Chemistry Labs Test Date: 12/28/2009                      Value Units        H/L   Reference  SGOT:               14    U/L                (10-40) SGPT:               15    U/L                (10-40) Alkaline P'tase:    60    U/L                (10-120) T. Bili:            0.4   mg/dL              (5.3-6.6) Albumin:            4.1   g/dL               (3-5) Total Protein:      6.9   g/dL               (4-7)  Appended Document: labs from derm LFTs okay, okay to start simvastatin

## 2010-10-11 NOTE — Letter (Signed)
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 They recommend : strict control of her cardiovascular risk factors Headache may  be cervicogenic, avoids forceful neck manipulation, consider PT MRA of the brain and neck in one year

## 2010-10-11 NOTE — Progress Notes (Signed)
Summary: ?labs  Phone Note Call from Patient Call back at [facility] Phone [phone]   Summary of Call: Patient saw Dr. Clarisse Gouge this week.  He is requesting a lipid panel.  Because he "discovered that I had 2 strokes" Patient has appt 5/10 for labs....  do you want anything else besides a lipid?? Shary Decamp  Jan 13, 2010 9:57 AM   Follow-up for Phone Call        not for now [place] E. Jilliana Burkes MD  Jan 13, 2010 1:04 PM

## 2010-10-11 NOTE — Progress Notes (Signed)
Summary: [facility] Medical Release completed for record copying  [facility] Medical Release completed for record copying. Request forwarded to [employer]. Dena Chavis  January 03, 2010 4:12 PM

## 2010-10-11 NOTE — Progress Notes (Signed)
Summary: promethazine refill (lmom 11/1,11/3, 11/4)  Phone Note Refill Request Message from:  Fax from Pharmacy on July 12, 2010 9:10 AM  Refills Requested: Medication #1:  PROMETHAZINE HCL 25 MG  TABS take 1 Q6 as needed for nausea/vomiting.   Last Ref[facility]: [facility]    [address], [place]   fax [phone]  Initial call taken by: Jerolyn Shin,  July 12, 2010 9:10 AM  Follow-up for Phone Call        if she still has nausea and vomiting, needs to come back to the office Follow-up by: [facility] E. Paz MD,  July 12, 2010 1:17 PM  Additional Follow-up for Phone Call Additional follow up Details #1::        Left message for pt to call back. Army Fossa CMA  July 12, 2010 1:30 PM     Additional Follow-up for Phone Call Additional follow up Details #2::    left message with pts husband for pt to call back. Army Fossa CMA  July 14, 2010 9:20 AM   Additional Follow-up for Phone Call Additional follow up Details #3:: Details for Additional Follow-up Action Taken: left message with pts husband for pt to call back. Army Fossa CMA  July 15, 2010 3:08 PM

## 2010-10-11 NOTE — Progress Notes (Signed)
Summary: neurology referral  Phone Note Outgoing Call   Summary of Call: discussed the  MRA of the neck with Dr. Chestine Spore, radiology...  although the report describes a severely decreased caliber of the left vertebral artery at its origin is suspicious of a high-grade stenosis, this is probably  an artefact. patient called, she still has HA (followed by weakness and feeling of almost passing out)  and also episodes of dizzines plan: refere to neurology Mikaelyn Arthurs E. Darlyn Repsher MD  December 23, 2009 1:55 PM

## 2010-10-11 NOTE — Letter (Signed)
Summary: Primary Care Consult Scheduled Letter  [facility]  [address] [place], [place] [address]   Phone: [phone]  Fax: [phone]      01/06/2010 MRN: [id number]  [name] [address] [place], [place]  [address]    Dear Ms. Okazaki,    We have scheduled an appointment for you.  At the recommendation of Dr. Willow Ora, we have scheduled you a consult with Dr. Marlan Palau of [facility] on 02-01-2010 at 9:00am.  Their address is [address], [address], [place] [address]. The office phone number is [phone].  If this appointment day and time is not convenient for you, please feel free to call the office of the doctor you are being referred to at the number listed above and reschedule the appointment.    It is important for you to keep your scheduled appointments. We are here to make sure you are given good patient care.   Thank you,    Renee, Patient Care Coordinator [place] [facility]

## 2010-10-11 NOTE — Progress Notes (Signed)
Summary: Phone-pain in skull, [facility] 3/23  Phone Note Call from Patient Call back at Home Phone [phone] Call back at Work Phone [phone]   Caller: Patient Summary of Call: Patient is requesting a personal phone call from Dr. Drue Novel. She states she has a pain in her skull. She does not want a nurse to call her back. Please advise Initial call taken by: Barb Merino,  December 01, 2009 4:19 PM  Follow-up for Phone Call        Left message on machine for pt to return call (hm# & wk#) Shary Decamp  December 01, 2009 4:37 PM scheduled office visit for tomorrow with Dr. Drue Novel -- pt upset because Dr. Drue Novel will not order MRI.  Advised pt that we can't order MRI because we have not evaluated her for her sxs Shary Decamp  December 01, 2009 4:51 PM

## 2010-10-11 NOTE — Progress Notes (Signed)
Summary: MRI Request  Phone Note Call from Patient Call back at Home Phone [phone]   Caller: Patient Call For: [place] E. Paz MD Summary of Call: Dr. Verdon Cummins requested a MRI be scheduled as soon as possible. Please call. Initial call taken by: Barnie Mort,  November 29, 2009 3:51 PM  Follow-up for Phone Call        left message for Dr. Rachel Bo office to return call Shary Decamp  November 29, 2009 4:34 PM  left message for Dr. Rachel Bo office to return call Shary Decamp  November 30, 2009 2:33 PM  Spoke with Arlyss Repress from Dr. Rachel Bo office.  Patient mentioned to Dr. Christell Faith (she always forgets when she is here to mention it).  That when she "bears down to have BM" she has a stabbing pain on left side of neck up left side of head x 3 months.  It last about 30 seconds.  She always has an achy feeling on the left side of her head.  Dr. Christell Faith suggested a MRI??   Shary Decamp  November 30, 2009 3:05 PM    Additional Follow-up for Phone Call Additional follow up Details #1::        patient will see orthopedics in few days, I recommend her to discuss her symptoms  with them. If neck pain is persisting, she has severe headaches,pain rad. to  the arms or she has upper extremity tingling let me know Additional Follow-up by: Jose E. Paz MD,  December 01, 2009 10:37 AM    Additional Follow-up for Phone Call Additional follow up Details #2::    pt informed of Dr Drue Novel recommendations, she has appt 12/08/09 with [facility] .Kandice Hams  December 01, 2009 12:19 PM

## 2010-10-11 NOTE — Consult Note (Signed)
Summary: [facility]   Imported By: Lanelle Bal 12/20/2009 08:33:38  _____________________________________________________________________  External Attachment:    Type:   Image     Comment:   External Document

## 2010-10-11 NOTE — Progress Notes (Signed)
Summary: Surgical Consult Scheduled   Phone Note Outgoing Call   Call placed by: Laureen Ochs LPN,  November 08, 2009 2:09 PM Call placed to: Patient Summary of Call: See Virtual Colon Report. Pt. is scheduled to see Dr.Rosenbower on 11-29-09 at 10:10am. All records have been faxed. Pt. instructed to call back as needed.  Initial call taken by: Laureen Ochs LPN,  November 08, 2009 2:10 PM

## 2010-10-11 NOTE — Progress Notes (Signed)
Summary: refill    Phone Note Refill Request Message from:  Fax from Pharmacy on July 25, 2010 10:27 AM  Refills Requested: Medication #1:  PROMETHAZINE HCL 25 MG  TABS take 1 Q6 as needed for nausea/vomiting.  Medication #2:  KLONOPIN 1 MG TAKE ONE TABLET BY MOUTH AT NIGHT [facility] [address] - fax [phone]  ----note on refill for klonopin refills were denied 11-9 & a new rx was to follow however we didnt receive the rx  Initial call taken by: Okey Regal [place],  July 25, 2010 10:28 AM  Follow-up for Phone Call        apparently she still has  nausea, needs a office visit before refills Follow-up by: Jose E. Paz MD,  July 25, 2010 12:50 PM  Additional Follow-up for Phone Call Additional follow up Details #1::        left message with pts husband- he will have her call us.  Additional Follow-up by: Army Fossa CMA,  July 25, 2010 1:18 PM    Additional Follow-up for Phone Call Additional follow up Details #2::    I spoke with patient and she mentioned that she will just have to call back cause she has no idea when she would be able to come in  Follow-up by: Shonna Chock CMA,  July 25, 2010 1:47 PM  Additional Follow-up for Phone Call Additional follow up Details #3:: Details for Additional Follow-up Action Taken: Do you want to refill Klonopin?  Army Fossa CMA,  July 25, 2010 1:49 PM  denied, I haven't Rx it (per EMR) she does not have ROVs  w/ [place] (just acute viasits) Jose E. Paz MD  July 25, 2010 5:02 PM   left message for pt to call back. Army Fossa CMA  July 26, 2010 8:19 AM  Patient is aware rx is denied and she needs a OV to be able to obtain new rx.Harold Barban  July 27, 2010 10:37 AM

## 2010-10-11 NOTE — Progress Notes (Signed)
  Phone Note Call from Patient Call back at [facility] Phone [phone] Call back at Work Phone [phone]   Summary of Call: PATIENT LEFT MESSAGE ON VM: fax copy of MRI to Dr. Christell Faith fax # [phone] Shary Decamp  December 28, 2009 3:41 PM

## 2010-10-11 NOTE — Progress Notes (Signed)
Summary: GYN?  Phone Note Call from Patient Call back at [facility] Phone [phone]   Caller: Patient Summary of Call: Pt called and would like to know of a GYN that you would recommend her seeing? Please advise. Army Fossa CMA  August 12, 2010 10:02 AM   Follow-up for Phone Call        Dr. Conley Simmonds, Dr. Lily Peer, Dr. Algie Coffer, Dr Nicholas Lose understanding that there is many other excellent gynecologist in town Hatley E. Paz MD  August 12, 2010 11:07 AM   Additional Follow-up for Phone Call Additional follow up Details #1::        Left message for pt to call back. Army Fossa CMA  August 12, 2010 11:13 AM     Additional Follow-up for Phone Call Additional follow up Details #2::    I spoke with pts husband he is aware.  Follow-up by: Army Fossa CMA,  August 12, 2010 11:15 AM

## 2010-10-11 NOTE — Progress Notes (Signed)
Summary: TRIAGE  Medications Added ULTRAM 50 MG TABS (TRAMADOL HCL) Take 1 by mouth every 6-8 hours as needed for pain.       Phone Note Call from Patient Call back at Home Phone [phone]   Caller: Patient Call For: Juanda Chance Reason for Call: Talk to Nurse Summary of Call: Patient wants to speak to nurse regarding virtual Colonoscopy, states that she has a lot of pain. Initial call taken by: Tawni Levy,  October 04, 2009 12:51 PM  Follow-up for Phone Call        Last OV was 08-02-09.  Pt. states she continues with lower abd. pain, worse at times. She wants to schedule the virtual Colonoscopy. I spoke w/Ellen at [facility] about pt. insurance benefits. Pt. had more questions, she will call [facility]. Pt. will callback when she is ready to get Virtual Colon scheduled.  DR.Trashaun Streight--She also wants something for the abd. pain,states, "I cleaned myself out yesterday" but continues with moderate abd. pain.     Follow-up by: Laureen Ochs LPN,  October 04, 2009 1:57 PM  Additional Follow-up for Phone Call Additional follow up Details #1::        1) Per Dr.Orianna Biskup--Tramadol 50mg  1 by mouth Q6-8 hours as needed for pain #20 with 1 refill.  2) Virtual Colonoscopy to be scheduled at [facility], records faxed to [employer]. She will call pt. with appt. information. Pt. instructed to call back as needed.  Pt. aware of above. Pt. instructed to call back as needed.  Additional Follow-up by: Laureen Ochs LPN,  October 04, 2009 3:02 PM    New/Updated Medications: ULTRAM 50 MG TABS (TRAMADOL HCL) Take 1 by mouth every 6-8 hours as needed for pain. Prescriptions: ULTRAM 50 MG TABS (TRAMADOL HCL) Take 1 by mouth every 6-8 hours as needed for pain.  #20 x 1   Entered by:   Laureen Ochs LPN   Authorized by:   Hart Carwin MD   Signed by:   Laureen Ochs LPN on 62/95/2841   Method used:   Electronically to        [facility] [address] [id number] (retail)       [address] [place]       [place], [place]  [address]       Ph: [phone]       Fax: [phone]   RxID:   [id number]

## 2010-10-12 ENCOUNTER — Encounter: Payer: Self-pay | Admitting: Internal Medicine

## 2010-10-12 ENCOUNTER — Encounter (INDEPENDENT_AMBULATORY_CARE_PROVIDER_SITE_OTHER): Payer: Self-pay | Admitting: *Deleted

## 2010-10-13 NOTE — Progress Notes (Signed)
Summary: referral  Phone Note Call from Patient Call back at Home Phone [phone]   Summary of Call: Patient left message on triage requesting call back about referral. Initial call taken by: Lucious Groves CMA,  September 20, 2010 1:13 PM  Follow-up for Phone Call        PT CALLING BACK FOR CONTACT INFO TO RSC'D STRESS TEST, MMG & BMD REFERRALS.  SHE CXLED ORIGINAL APPTS.  Magdalen Spatz [facility]  September 20, 2010 2:10 PM

## 2010-10-13 NOTE — Assessment & Plan Note (Signed)
Summary: ARTHRITIS/NTA   Vital Signs:  Patient profile:   65 year old female Height:      69 inches Weight:      186 pounds BMI:     27.57 Pulse rate:   85 / minute Pulse rhythm:   regular BP sitting:   128 / 88  (left arm) Cuff size:   large  Vitals Entered By: Army Fossa CMA (September 23, 2010 3:07 PM) CC: Pt here to discuss Arthritis.  Comments in her knees mostly hard for her to get going [facility] [address]    History of Present Illness: long history of R>>L knee pain, status post arthroscopy a few months ago by Dr Rennis Chris, the pain decreased but is still substantial enough that she cannot do her activities of daily. She only takes Aleve from time to time  ROS Status post physical therapy she is still active, force  herself to walk Occasional lower extremity edema      Current Medications (verified): 1)  Depakote 500 Mg Tbec (Divalproex Sodium) .... 2 By Mouth Once Daily 2)  Cymbalta 60 Mg Cpep (Duloxetine Hcl) .... Take 1 Capsule By Mouth Once A Day 3)  Clonazepam 2 Mg Tabs (Clonazepam) .Marland Kitchen.. 1 Tablet By Mouth At Bedtime 4)  Requip 5)  Promethazine Hcl 25 Mg  Tabs (Promethazine Hcl) .... Take 1 Q6 As Needed For Nausea/vomiting 6)  Methotrexate 2.5 Mg Tabs (Methotrexate Sodium) 7)  Aspirin 81 Mg Tbec (Aspirin) .Marland Kitchen.. 1 By Mouth Daily 8)  Pravachol 40 Mg Tabs (Pravastatin Sodium) .... One By Mouth At Bedtime, Watch For Muscle Aches  Allergies (verified): No Known Drug Allergies  Past History:  Past Medical History: Reviewed history from 08/15/2010 and no changes required. 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 strict CVRF control 2006-[place]: had EKG changes, stress test negative (" false EKG changes")  Past Surgical History: Reviewed history from 08/15/2010 and no changes required. Tubal Ligation R knee arthroscopy  ~  8-11   Social History: married brother in law Mr Cranston Neighbor (one of my patient) retired 1 son  quit tobacco June 2010 aprox, on the E-cigarrett  Alcohol Use - yes-on occasion Daily Caffeine Use  Physical Exam  General:  alert and well-developed.   Extremities:  no pitting edema on either side Both knees with changes consistent with DJD. Right knee slightly puffy and warm but not read   Impression & Recommendations:  Problem # 1:  DEGENERATIVE JOINT DISEASE (ICD-715.90)  persistent, severe, lifestyle limiting pain in the right knee, likely from DJD She is not going back to her previous orthopedic doctor Will refer her to a different orthopedist. In the meantime I recommend her a low dose of hydrocodone, knows to watch for drowsiness parking permit provided Her updated medication list for this problem includes:    Aspirin 81 Mg Tbec (Aspirin) .Marland Kitchen... 1 by mouth daily    Hydrocodone-acetaminophen 2.5-500 Mg Tabs (Hydrocodone-acetaminophen) .Marland Kitchen... 1 or 2 by mouth every 6 hours as needed  Orders: Orthopedic Referral (Ortho)  Problem # 2:  additionally her son is diabetic and not  doing very well, counseled, she is anxious  Complete Medication List: 1)  Depakote 500 Mg Tbec (Divalproex sodium) .... 2 by mouth once daily 2)  Cymbalta 60 Mg Cpep (Duloxetine hcl) .... Take 1 capsule by mouth once a day 3)  Clonazepam 2  Mg Tabs (Clonazepam) .Marland Kitchen.. 1 tablet by mouth at bedtime 4)  Requip  5)  Promethazine Hcl 25 Mg Tabs (Promethazine hcl) .... Take 1 q6 as needed for nausea/vomiting 6)  Methotrexate 2.5 Mg Tabs (Methotrexate sodium) 7)  Aspirin 81 Mg Tbec (Aspirin) .Marland Kitchen.. 1 by mouth daily 8)  Pravachol 40 Mg Tabs (Pravastatin sodium) .... One by mouth at bedtime, watch for muscle aches 9)  Hydrocodone-acetaminophen 2.5-500 Mg Tabs (Hydrocodone-acetaminophen) .Marland Kitchen.. 1 or 2 by mouth every 6 hours as needed Prescriptions: HYDROCODONE-ACETAMINOPHEN 2.5-500 MG TABS  (HYDROCODONE-ACETAMINOPHEN) 1 or 2 by mouth every 6 hours as needed  #60 x 0   Entered and Authorized by:   Elita Quick E. Wenda Vanschaick MD   Signed by:   Nolon Rod. Nitin Mckowen MD on 09/23/2010   Method used:   Print then Give to Patient   RxID:   [id number]    Orders Added: 1)  Est. Patient Level III [84696] 2)  Orthopedic Referral [Ortho]

## 2010-10-13 NOTE — Letter (Signed)
Summary: Unable To Reach-Consult Scheduled   at [facility]  [address] [place], [place] [address]   Phone: [phone]  Fax: [phone]    09/20/2010 MRN: [id number]    Dear Ms. Nakamura,   We have been unable to reach you by phone.  Please contact our office with an updated phone number.      Thank you,   Army Fossa CMA  September 20, 2010 9:03 AM

## 2010-10-13 NOTE — Letter (Signed)
Summary: [facility]  [facility]   Imported By: Lanelle Bal 08/26/2010 13:54:17  _____________________________________________________________________  External Attachment:    Type:   Image     Comment:   External Document

## 2010-10-13 NOTE — Miscellaneous (Signed)
Summary: Appointment Canceled  Appointment status changed to canceled by [employer] on 08/26/2010 10:31 AM.  Cancellation Comments --------------------- crs. wt: 184. dx: chestpain.dr. Drue Novel. medicare,. bcbs. no precret. gd  Appointment Information ----------------------- Appt Type:  CARDIOLOGY NUCLEAR TESTING      Date:  Monday, August 29, 2010      Time:  9:45 AM for 15 min   Urgency:  Routine   Made By:  Hoy Finlay Scheduler  To Visit:  [id number]    Reason:  crs. wt: 184. dx: chestpain.dr. Drue Novel. medicare,. bcbs. no precret. gd  Appt Comments ------------- -- 08/26/10 10:31: ([facility]) CANCELED -- crs. wt: 184. dx: chestpain.dr. Drue Novel. medicare,. bcbs. no precret. gd -- 08/16/10 15:23: ([facility]) BOOKED -- Routine CARDIOLOGY NUCLEAR TESTING at 08/29/2010 9:45 AM for 15 min crs. wt: 184. dx: chestpain.dr. Gwen Pounds  Appended Document: Appointment Canceled advise patient: I rec her to proceed w/ stress test, she has CP, heart disease?  Appended Document: Appointment Canceled (lmom 1/3, 1/4, 1/5, 1/9)   left message for pt to call back. Army Fossa CMA  September 13, 2010 10:22 AM  left message w/ pts husband to call back. Army Fossa CMA  September 14, 2010 10:54 AM  left message for pt  to call back on her cell. Army Fossa CMA  September 15, 2010 1:46 PM  left message for pt to call back. Army Fossa CMA  September 19, 2010 1:50 PM  Pt never returned call. Mailed pt a letter. Army Fossa CMA  September 20, 2010 9:03 AM

## 2010-10-19 NOTE — Letter (Signed)
Summary: Primary Care Appointment Letter  [facility]  [address] [place], [place] [address]   Phone: [phone]  Fax: [phone]    10/12/2010 MRN: [id number]  KHANIYAH BEZEK [address] [place], [place]  [address]  Dear [name],   Your Primary Care Physician [place] E. Paz MD has indicated that:    _______it is time to schedule an appointment.    _______you missed your appointment on______ and need to call and          reschedule.    _______you need to have lab work done.    _______you need to schedule an appointment discuss lab or test results.    _______you need to call to reschedule your appointment that is                       scheduled on      12/16/2010        with Dr Drue Novel for a 4 month followup.  He will not be in the office that day.    Please call our office as soon as possible. Our phone number is [phone]. Our office is open 8a-12noon and 1p-5p, Monday through Friday.     Thank you,    [place] [facility] Primary Care Scheduler

## 2010-11-03 ENCOUNTER — Encounter: Payer: Self-pay | Admitting: Internal Medicine

## 2010-11-10 ENCOUNTER — Encounter: Payer: Self-pay | Admitting: Internal Medicine

## 2010-11-17 ENCOUNTER — Encounter (INDEPENDENT_AMBULATORY_CARE_PROVIDER_SITE_OTHER): Payer: Self-pay | Admitting: *Deleted

## 2010-11-22 NOTE — Letter (Signed)
Summary: Primary Care Appointment Letter  [facility] [place]  [address] [place], [place] [address]   Phone: [phone]  Fax: [phone]    11/17/2010 MRN: [id number]  [name] [address] [place], [place]  [address]  Dear [name],   Your Primary Care Physician [place] E. Paz MD has indicated that:    _______it is time to schedule an appointment.    _______you missed your appointment on______ and need to call and          reschedule.    _______you need to have lab work done.    _______you need to schedule an appointment discuss lab or test results.      Arlester Marker     You need to call to reschedule your appointment that is                       scheduled for Tuesday, December 20, 2010.  This appointment was for a 4 month followup appointment at 2:00 PM.  Dr Drue Novel will not be in the office that day.              Please call our office as soon as possible. Our phone number is [phone]. Our office is open 8a-12noon and 1p-5p, Monday through Friday.     Thank you,     Primary Care Scheduler

## 2010-12-08 NOTE — Letter (Signed)
Summary: [facility]--R knee injection  [facility]   Imported By: Kassie Mends 11/25/2010 08:07:31  _____________________________________________________________________  External Attachment:    Type:   Image     Comment:   External Document

## 2010-12-20 ENCOUNTER — Ambulatory Visit: Payer: Self-pay | Admitting: Internal Medicine

## 2010-12-26 ENCOUNTER — Telehealth: Payer: Self-pay | Admitting: Internal Medicine

## 2010-12-26 NOTE — Telephone Encounter (Signed)
Patient calling to report abdominal pain. States the pain is "all over my abdomen above and below my belly button." states the pain began on Easter Sunday and was worse on this past Saturday. Patient states her bowel movements have been black but are lighter now. Denies taking Pepto Bismol. States she is not constipated. Denies fever or visible blood on stool. States she is having nausea that is relived with store brand antiacid pills.States her gas has a bad odor. Patient took Hydrocodone 1 po last night, states she usually takes 1/2 tab every night to sleep. Offered for patient to see the extender tomorrow but she wants to see Dr. Juanda Chance. Scheduled her on 12/30/10 at 10:15 AM.

## 2010-12-26 NOTE — Telephone Encounter (Signed)
Reviewed and agree. Before she sees me, please ask her to start on Align 1 po qd. DB

## 2010-12-27 ENCOUNTER — Ambulatory Visit (INDEPENDENT_AMBULATORY_CARE_PROVIDER_SITE_OTHER): Payer: Medicare Other | Admitting: Internal Medicine

## 2010-12-27 ENCOUNTER — Encounter: Payer: Self-pay | Admitting: Internal Medicine

## 2010-12-27 DIAGNOSIS — R109 Unspecified abdominal pain: Secondary | ICD-10-CM | POA: Insufficient documentation

## 2010-12-27 LAB — POCT URINALYSIS DIPSTICK
Bilirubin, UA: NEGATIVE
Glucose, UA: NEGATIVE
Nitrite, UA: NEGATIVE
Protein, UA: NEGATIVE
pH, UA: 7

## 2010-12-27 NOTE — Assessment & Plan Note (Addendum)
Several days' history of abdominal pain, overall she feels better. At some point her stools were dark. 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 In the past she had an incomplete colonoscopy and barium enema. Had a virtual colonoscopy/2011 that showed a tortuous colon but no obstruction. today urinalysis is negative, Hemoccult negative as well. Plan: Labs She has an appointment to see GI this week and is encouraged to keep it. If symptoms increase she needs to let me know immediately particularly if severe abdominal pain, fever, decreased appetite or dark stools again.

## 2010-12-27 NOTE — Patient Instructions (Signed)
Bland diet Call if symptoms severe, fever, black stools, blood in the stools See GI as planned

## 2010-12-27 NOTE — Progress Notes (Signed)
  Subjective:    Patient ID: [name], female    DOB: 1945/11/12, 65 y.o.   MRN: [id number]  HPI  Symptoms started about 8 days ago, 2 days after she ate and drank more than usual. Described generalized abdominal pain and a bloated feeling. Pain was not crampy or burning. She took a pain medication x 1  (Vicodin) and since then the pain has decreased to some extent. At this point, the pain is essentially gone, she is just sore to palpation, the bloated feeling has decreased as well.  Past Medical History  Diagnosis Date  . Hypertension   . Constipation   . CAD (coronary artery disease)     intra and extracranial vascular dx per MRI 4/11, neurology rec strict CVRF control  . Depression   . Psoriasis     sees derm  . EKG abnormalities     changes, stress test neg (false EKG changes)   Past Surgical History  Procedure Date  . Tubal ligation   . Arthroscopy 04/2010    Right knee    Social History: married brother in law Mr Cranston Neighbor (one of my patient) retired 1 son  quit tobacco June 2010 aprox, on the E-cigarrett  Alcohol Use - yes-on occasion Daily Caffeine Use  Review of Systems Appetite is "okay" good by mouth tolerance. She had some nausea, no vomiting. Has not been taking any Motrin-NSAIDs at all. She reports "flu symptoms" along with abdominal pain... mostly feeling weak , dizziness  and sneezing. No myalgias per se. She did not have diarrhea but with the onset of symptoms she had 2 or 3 bowel movements a day, the stools were "black, like ink" Currently she is back having 1 bowel movement a day and the color is lighter. Question of mucus with the stools.    Objective:   Physical Exam  Constitutional: She appears well-developed and well-nourished.       No distress, nontoxic, looks very tired  Eyes:       Not pale or jaundice  Cardiovascular: Normal rate, regular rhythm and normal heart sounds.   No murmur heard. Pulmonary/Chest: Effort normal and breath  sounds normal. No respiratory distress. She has no wheezes.  Abdominal:       Nondistended, increased  bowel sounds, tender at the lower abdomen and the periumbilical area without mass or rebound.  Genitourinary:        DRE without mass, normal sphincter tone, brown stools, Hemoccult negative          Assessment & Plan:

## 2010-12-27 NOTE — Telephone Encounter (Signed)
Spoke with patient's husband and gave him Dr. Regino Schultze recommendation. He will have patient call me if she has questions.

## 2010-12-28 ENCOUNTER — Encounter: Payer: Self-pay | Admitting: Internal Medicine

## 2010-12-28 LAB — CBC WITH DIFFERENTIAL/PLATELET
Basophils Absolute: 0 10*3/uL (ref 0.0–0.1)
Eosinophils Absolute: 0 10*3/uL (ref 0.0–0.7)
Eosinophils Relative: 1.3 % (ref 0.0–5.0)
Hemoglobin: 11.4 g/dL — ABNORMAL LOW (ref 12.0–15.0)
Lymphocytes Relative: 50.8 % — ABNORMAL HIGH (ref 12.0–46.0)
Lymphs Abs: 1.9 10*3/uL (ref 0.7–4.0)
MCHC: 33.7 g/dL (ref 30.0–36.0)
Monocytes Absolute: 0.2 10*3/uL (ref 0.1–1.0)
Monocytes Relative: 5.8 % (ref 3.0–12.0)
Neutro Abs: 1.5 10*3/uL (ref 1.4–7.7)
Neutrophils Relative %: 41.7 % — ABNORMAL LOW (ref 43.0–77.0)
RBC: 3.66 Mil/uL — ABNORMAL LOW (ref 3.87–5.11)
RDW: 13.9 % (ref 11.5–14.6)

## 2010-12-28 LAB — HEPATIC FUNCTION PANEL
Albumin: 3.7 g/dL (ref 3.5–5.2)
Alkaline Phosphatase: 59 U/L (ref 39–117)
Total Protein: 6.7 g/dL (ref 6.0–8.3)

## 2010-12-29 ENCOUNTER — Telehealth: Payer: Self-pay | Admitting: *Deleted

## 2010-12-29 NOTE — Telephone Encounter (Signed)
Spoke w/ pt aware of labs.  

## 2010-12-29 NOTE — Telephone Encounter (Signed)
Message copied by Army Fossa on Thu Dec 29, 2010 10:19 AM ------      Message from: Willow Ora      Created: Wed Dec 28, 2010  5:24 PM       Advise patient:      Labs okay, very mild anemia. If she's feeling worse, let me know

## 2010-12-29 NOTE — Telephone Encounter (Signed)
Message left for patient to return my call.  

## 2010-12-30 ENCOUNTER — Encounter: Payer: Self-pay | Admitting: Internal Medicine

## 2010-12-30 ENCOUNTER — Ambulatory Visit (INDEPENDENT_AMBULATORY_CARE_PROVIDER_SITE_OTHER): Payer: Medicare Other | Admitting: Internal Medicine

## 2010-12-30 VITALS — BP 132/80 | HR 68 | Ht 69.0 in | Wt 188.0 lb

## 2010-12-30 DIAGNOSIS — K5939 Other megacolon: Secondary | ICD-10-CM

## 2010-12-30 DIAGNOSIS — R933 Abnormal findings on diagnostic imaging of other parts of digestive tract: Secondary | ICD-10-CM

## 2010-12-30 MED ORDER — ONDANSETRON HCL 4 MG PO TABS: ORAL_TABLET | ORAL | Status: DC

## 2010-12-30 MED ORDER — OMEPRAZOLE 20 MG PO CPDR: 20.0000 mg | DELAYED_RELEASE_CAPSULE | Freq: Every day | ORAL | Status: DC

## 2010-12-30 NOTE — Progress Notes (Signed)
[name] 06-27-1946 MRN [id number]      History of Present Illness:  This is a 65 year old white female with colonic inertia and severe chronic constipation. She is status post extensive evaluation in February 2011. She has continued problems. She saw a Careers adviser in [place] and also saw Dr Abbey Chatters at [facility] but they have been reluctant to operate. She is on a regimen of laxatives which includes probiotics, colon cleanser, MiraLax and Dulcolax. She is here today because of increased gastroesophageal reflux. She experiences chest pain at night and reflux of gastric contents. She denies dysphagia. She has taken cimetidine but not on a regular basis.   Past Medical History  Diagnosis Date  . Hypertension   . Constipation     chronic;severe  . CAD (coronary artery disease)     intra and extracranial vascular dx per MRI 4/11, neurology rec strict CVRF control  . Depression   . Psoriasis     sees derm  . EKG abnormalities     changes, stress test neg (false EKG changes)  . Colonic inertia   . Bipolar disorder   . GERD (gastroesophageal reflux disease)    Past Surgical History  Procedure Date  . Tubal ligation   . Arthroscopy 04/2010    Right knee    reports that she quit smoking about 22 months ago. Her smoking use included Cigarettes. She has never used smokeless tobacco. She reports that she drinks alcohol. She reports that she does not use illicit drugs. family history includes Alcohol abuse in her brother; Breast cancer in her mother; Diabetes in her son; and Heart disease in an unspecified family member.  There is no history of Colon cancer. No Known Allergies      Review of Systems: Positive for chest pains. Negative for dysphagia. Positive for heartburn. Denies shortness of breath.  The remainder of the 10  point ROS is negative except as outlined in H&P   Physical Exam: General appearance  Well developed in no distress. Eyes- non icteric. HEENT  nontraumatic, normocephalic. Mouth no lesions, tongue papillated, no cheilosis. Neck supple without adenopathy, thyroid not enlarged, no carotid bruits, no JVD. Lungs Clear to auscultation bilaterally. Cor normal S1 normal S2, regular rhythm , no murmur,  quiet precordium. Abdomen soft mildly tender in the left middle quadrant. No palpable mass. Mild distention. Tympany present. Rectal: Tender anal canal. Soft Hemoccult negative stool. Extremities no pedal edema. Skin no lesions. Neurological alert and oriented x 3. Psychological normal mood and affect.  Assessment and Plan:   Problem #1 gastroesophageal reflux. This is a new diagnosis. We will start her on Prilosec 20 mg daily and antireflux measures. We will schedule her for an upper endoscopy to rule out Barrett's esophagus.  Problem #2 colonic inertia. She will continue her bowel regimen for now.   12/30/2010 [name]

## 2010-12-30 NOTE — Patient Instructions (Signed)
You have been scheduled for an endoscopy. Please follow written instructions given to you at your visit today. We have given you samples of Prilosec to take 1 tablet once daily. We have sent Zofran to your pharmacy. You should take 1 tablet every 8 hours as needed for nausea.

## 2011-01-02 ENCOUNTER — Other Ambulatory Visit: Payer: Medicare Other | Admitting: Internal Medicine

## 2011-01-02 ENCOUNTER — Ambulatory Visit (AMBULATORY_SURGERY_CENTER): Payer: Medicare Other | Admitting: Internal Medicine

## 2011-01-02 ENCOUNTER — Encounter: Payer: Self-pay | Admitting: Internal Medicine

## 2011-01-02 VITALS — BP 89/56 | HR 66 | Temp 98.2°F | Resp 14 | Ht 69.0 in | Wt 188.0 lb

## 2011-01-02 DIAGNOSIS — R079 Chest pain, unspecified: Secondary | ICD-10-CM

## 2011-01-02 DIAGNOSIS — K219 Gastro-esophageal reflux disease without esophagitis: Secondary | ICD-10-CM

## 2011-01-02 DIAGNOSIS — K2940 Chronic atrophic gastritis without bleeding: Secondary | ICD-10-CM

## 2011-01-02 DIAGNOSIS — K2980 Duodenitis without bleeding: Secondary | ICD-10-CM

## 2011-01-02 DIAGNOSIS — K2960 Other gastritis without bleeding: Secondary | ICD-10-CM

## 2011-01-02 DIAGNOSIS — K2970 Gastritis, unspecified, without bleeding: Secondary | ICD-10-CM

## 2011-01-02 NOTE — Patient Instructions (Signed)
Discharged instructions given with verbal understanding. Handout on gastritis and a soft diet for today. Resume previous medications.

## 2011-01-03 ENCOUNTER — Telehealth: Payer: Self-pay

## 2011-01-03 NOTE — Telephone Encounter (Signed)

## 2011-01-05 ENCOUNTER — Encounter: Payer: Self-pay | Admitting: Internal Medicine

## 2011-01-24 NOTE — Assessment & Plan Note (Signed)
[facility]                                 ON-CALL NOTE   [name], [name]                         MRN:          [id number]  DATE:10/27/2007                            DOB:          September 29, 1945    PRIMARY GASTROENTEROLOGIST:  Dr. [name].   [name] called tonight stating she has had odynophagia for the last 2  to 3 days.  She said it was really bad a couple days ago and it seems  like it is getting better.  She has had no dysphagia.  She says liquids  and solids go down without any feeling of food hanging up.  It just  seems to really scratch and hurt as it goes down.  Of interest, she has  been on Doxycycline for the past 2-1/2 weeks for a dental infection.  She has 2 pills left of the Doxycycline.   I explained to [name] that she very likely has an esophageal  ulceration or irritation from Doxycycline, and that she should stop  taking her pills since she only has 2 left anyway.  I also recommended  that she start taking OTC Prilosec 1 pill twice a day for the next 2 to  3 weeks, and to start taking that tonight.  Lastly, I recommended she  call the office tomorrow to see if she could be seen either tomorrow or  Tuesday, and to call sooner if her swallowing becomes worse or if she  has any feeling of food impaction.     Rachael Fee, MD  Electronically Signed    DPJ/MedQ  DD: 10/27/2007  DT: 10/28/2007  Job #: [id number]   cc:   Hedwig Morton. Juanda Chance, MD

## 2011-01-24 NOTE — Assessment & Plan Note (Signed)
[facility]                         GASTROENTEROLOGY OFFICE NOTE   [name], [name]                         MRN:          [id number]  DATE:10/28/2007                            DOB:          1946/09/04    PROBLEM:  Painful swallowing.   HISTORY:  [name] is a pleasant 65 year old white female known to Dr.  Juanda Chance, who underwent consultation in 2006 and was to undergo  colonoscopy which she canceled.  She says she chickened out because  she was afraid she would not be adequately sedated.   Today the patient presents with a new problem of painful swallowing over  the past 4 days.  She had been on a course of doxycycline over the  previous 2 weeks because of an infection in her tooth, and says she  started having painful swallowing about 4 to 5 days ago.  She had also  been nauseated while on the doxycycline, but had not been having any  vomiting.  She does not feel that her food is sticking, but just has  discomfort in her lower chest with swallowing.  She generally is not  bothered by any heartburn, indigestion, or reflux symptoms, has never  had any problems with dysphagia or odynophagia.  She has since stopped  the doxycycline.  She talked to Dr. Christella Hartigan covering on call over the  weekend, and was started on over-the-counter Prilosec twice daily and  says she feels a little bit better over the past 2 days.  She denies any  abdominal pain.  Still has problems with constipation which has been her  norm for some time, denies any melena or hematochezia.   CURRENT MEDICATIONS:  1. Cymbalta 120 mg daily.  2. Omeprazole 20 mg b.i.d., just started.  3. Klonopin 1 mg nightly.  4. Depakote 500 mg daily.   ALLERGIES:  No known drug allergies.   PHYSICAL EXAMINATION:  A well-developed white female in no acute  distress, alert and oriented x3.  HEENT:  Nontraumatic, normocephalic, EOMI, PERRLA, sclerae anicteric.  Oropharynx is clear without any evidence  of candida.  CARDIOVASCULAR:  Regular rate and rhythm with S1 and S2.  PULMONARY:  Clear to A&P.  ABDOMEN:  Soft, bowel sounds are active.  She is nontender.  There is no  palpable mass or hepatosplenomegaly.   IMPRESSION:  Probable acute pill-induced esophagitis secondary to  doxycycline.   PLAN:  1. Continue b.i.d. Prilosec x2 weeks and then daily thereafter x2      weeks.  2. Add Carafate slurry 1 g q.i.d. between meals and h.s. x10 days.  3. Full liquid to very soft diet until symptoms improve.  4. Discussed colonoscopy again with the patient.  She is still      reluctant.  We discussed possible other options for heavier      sedation, i.e., propofol, and I encouraged her to make a followup      appointment with Dr. Juanda Chance to discuss rescheduling colonoscopy.      Mike Gip, PA-C  Electronically Signed      Wilhemina Bonito. Marina Goodell, MD  Electronically Signed  AE/MedQ  DD: 10/28/2007  DT: 10/29/2007  Job #: [id number]   cc:   Willow Ora, MD  Hedwig Morton. Juanda Chance, MD

## 2011-01-24 NOTE — Assessment & Plan Note (Signed)
[facility]                                 ON-CALL NOTE   CHRISTOL, THETFORD                         MRN:          [id number]  DATE:10/26/2007                            DOB:          04/13/1946    Patient of Dr. Drue Novel.   Patient calling because she has a viral syndrome, head congestion, runny  nose, and cough.  She has also had 6 weeks' difficulty of swallowing.  She said some of her pills are now getting stuck.  Advised symptomatic  treatment for the viral infection.  See her gastroenterologist next  week.  Stay on clear liquid diet until then.  She obviously has symptoms  of esophageal stricture.     Jeffrey A. Tawanna Cooler, MD  Electronically Signed    JAT/MedQ  DD: 10/26/2007  DT: 10/28/2007  Job #: [id number]

## 2011-01-27 NOTE — Cardiovascular Report (Signed)
NAMEGEORGIE, EDUARDO                ACCOUNT NO.:  [id number]   MEDICAL RECORD NO.:  [id number]          PATIENT TYPE:  INP   LOCATION:  3728                         FACILITY:  [facility]   PHYSICIAN:  Everardo Beals. Juanda Chance, MD, FACCDATE OF BIRTH:  1946/03/10   DATE OF PROCEDURE:  08/31/2006  DATE OF DISCHARGE:  08/31/2006                            CARDIAC CATHETERIZATION   PAST MEDICAL HISTORY:  Mrs. Costanza is 65 years old and was admitted to  the emergency room with severe chest pain.  She has a history of tobacco  use and depression and a positive family history for coronary artery  disease.  Her troponins were negative.  She was seen in consultation by  Dr. Antoine Poche, who recommended evaluation with angiography.   PROCEDURE:  The procedure was performed with the right femoral arteries  and arterial sheath and 6-French pre-formed coronary catheters.  A front  wall arterial puncture was performed and Omnipaque contrast was used.  The patient tolerated the procedure well and left the laboratory in  satisfactory condition.  The right femoral artery was closed with Angio-  Seal at the end of the procedure.   RESULTS:  1. The left main coronary artery:  The left main coronary artery was      free of significant disease.  2. The left anterior descending artery:  The left anterior descending      artery gave rise to diagonal branch and septal perforators.  These      and the LAD were free of significant disease.  3. The circumflex artery:  The circumflex artery gave rise to marginal      branches and posterolateral branch.  These branches were free of      significant disease.  4. The right coronary artery:  The right coronary artery was a      moderate size vessel that gave rise to a posterior descending and      posterolateral branch.  These vessels were free of significant      disease.  5. Distal aortogram:  A distal aortogram was performed, which showed      80% stenosis of the right renal  artery.   IMPRESSION:  1. Normal coronary angiography and left ventricular function.  2. Eighty percent right renal artery stenosis.   RECOMMENDATIONS:  1. We will check a D-dimer to help screen for pulmonary embolism.  2. We will add Norvasc for better blood pressure control.  3. Plan discharge later today.      Bruce Elvera Lennox Juanda Chance, MD, [facility]  Electronically Signed    BRB/MEDQ  D:  12/07/2006  T:  12/08/2006  Job:  [id number]   cc:   Virl Axe, MD, [facility]

## 2011-01-27 NOTE — Consult Note (Signed)
[name], [name]                ACCOUNT NO.:  [id number]   MEDICAL RECORD NO.:  [id number]          PATIENT TYPE:  INP   LOCATION:  3728                         FACILITY:  [facility]   PHYSICIAN:  Rollene Rotunda, MD, FACCDATE OF BIRTH:  [birth year]   DATE OF CONSULTATION:  08/30/2006  DATE OF DISCHARGE:                                 CONSULTATION   PRIMARY CARE PHYSICIAN:  Willow Ora, M.D.   REFERRING PHYSICIAN:  Vikki Ports A. Felicity Coyer, M.D.   REASON FOR CONSULTATION/EVALUATION:  Evaluate patient with chest pain.   HISTORY OF PRESENT ILLNESS:  The patient is a 65 year old white female  who did have palpitations and some chest discomfort almost 30 years ago,  she reports.  In the 1970s, she reports a catheterization with some  evidence of some mild plaque.  Over the years, she has had a couple of  stress tests she reports as normal.  She does get chest discomfort  periodically.  This has been a chronic pattern.  She gets a dull  discomfort in the middle of her chest and drinks something, and it goes  away.  However, today about 8 a.m. at work, she had chest discomfort  different from this.  It was severe.  It was a 8/10 in intensity.  She  had radiation to her jaw.  It did not go away with drinking anything.  She had a little discomfort in her arms.  She felt diaphoretic and  nausea.  She went home from work.  She felt very fatigued.  The whole  episode lasted an hour and a half, going away spontaneously.  She has  had not had recurrence of this.   Otherwise, the patient has had some fatigue but has had no symptoms  similar to this.  She denies any resting shortness of breath and has no  PND or orthopnea.  She has no palpitations, presyncope, or syncope.  She  has had no fevers or chills.  She has not had any travel, calf  tenderness, or other unusual occurrences.   PAST MEDICAL HISTORY:  1. She does not know her lipid status.  2. She has no history of hypertension, diabetes.  3. She  has a history depression and anxiety that is treated moderately      successfully.  4. She has cirrhosis.   PAST SURGICAL HISTORY:  None.   ALLERGIES:  NONE TO MEDICATIONS.   MEDICATIONS:  1. Cymbalta 90 mg q.a.m.  2. Depakote 50 mg q.h.s.  3. Effexor XR 37.5 mg daily.  4. Klonopin 1 mg q.h.s.  5. __________  p.r.n.   SOCIAL HISTORY:  The patient has been a smoker for about 50 years and  still smokes a pack per day.  She is employed.  She is married.  She has  one adult son and four grandchildren.   FAMILY HISTORY:  Remarkable for a brother having two myocardial  infarctions and dying of heart disease at age 25.   REVIEW OF SYSTEMS:  As stated in HPI, positive for constipation.  Negative for other systems.   PHYSICAL EXAMINATION:  GENERAL:  The patient is in no distress.  VITAL SIGNS:  Blood pressure 169/94, heart rate 61 and regular,  afebrile, respiratory rate 16.  HEENT:  Eyes lids unremarkable.  Pupils equal, round, reactive to light.  Fundi not visualized.  Oral mucosa remarkable.  NECK:  No jugular distension, wave form within normal limits, carotid  upstroke brisk and symmetrical.  No bruits.  No thyromegaly.  LYMPHATICS:  No cervical, axillary, inguinal adenopathy.  LUNGS:  Clear to auscultation bilaterally.  BACK:  No costovertebral angle tenderness.  CHEST:  Unremarkable.  HEART:  PMI not displaced or sustained, S1-S2 within normal.  No S3, no  S4, no clicks, no rubs, no murmurs.  ABDOMEN:  Flat, positive bowel sounds normal frequency pitch, no bruits,  no rebound, no guarding, no midline pulsatile mass, no hepatomegaly, no  splenomegaly.  SKIN:  No rashes.  No nodules.  EXTREMITIES:  Two plus pulses throughout, no edema, no cyanosis or  clubbing.  NEUROLOGIC:  Oriented to person, place and time, cranial nerves II-XII  grossly intact, motor grossly intact.   EKG:  Sinus rhythm, axis within normal limits, intervals within normal  limits, no acute ST-wave  change.   LABORATORY:  CK-MB, troponin negative x1.  WBC 6.8, hemoglobin 12.3,  platelets 221.  Sodium 137, potassium 3.7, BUN 11, creatinine 0.9.  INR  1.1.   ASSESSMENT/PLAN:  1. The patient's chest discomfort has some worrisome features.  She      has some very significant risk factors.  Though there are no      objective findings consistent with ischemia, I think the pretest      probability of obstructive coronary disease is moderately high.  We      discussed all of the options for imaging including non-invasive      stress testing, CT scanning, and cardiac catheterization.  I think      because of the high pretest probability, that catheterization is      indicated.  We discussed this in detail.  She understands the risks      of catheterization to include stroke, death, heart attack,      embolism, dye allergy, renal insufficiency, vascular trauma,      bleeding and bruising.  She understands all this, having been      through it before.  She will watch the video tape.  She will be      given a booklet to read.  She agrees to proceed.  2. Tobacco.  We discussed the need to stop smoking.  I would suggest      she discuss using Chantix with her psychiatrist before being given      this.  3. Risk reduction.  She needs a lipid profile.  She can have      management of her blood pressure based on follow-up readings.      Rollene Rotunda, MD, [facility]  Electronically Signed    JH/MEDQ  D:  08/30/2006  T:  08/31/2006  Job:  [id number]   cc:   Willow Ora, MD

## 2011-01-27 NOTE — Assessment & Plan Note (Signed)
[facility]                                 ON-CALL NOTE   WAVERLY, TARQUINIO                         MRN:          [id number]  DATE:09/01/2006                            DOB:          [birth year]    DOCTOR OF PATIENT:  Dr. Drue Novel.   CALLER:  [name].   TELEPHONE NUMBER:  [phone]   TIME OF CALL:  11:37am.   She has been in the emergency room and was released last night. The  doctor had discussed a medicine to stop smoking, probably Chantix, and  husband wanted a prescription for that.   PLAN:  He was advised to call Dr. Drue Novel on Monday morning to discuss  starting new medicine.     Karie Schwalbe, MD  Electronically Signed    RIL/MedQ  DD: 09/01/2006  DT: 09/02/2006  Job #: [id number]

## 2011-01-27 NOTE — Assessment & Plan Note (Signed)
[facility]                            CARDIOLOGY OFFICE NOTE   [name], [name]                         MRN:          [id number]  DATE:09/20/2006                            DOB:          August 22, 1946    PRIMARY CARE PHYSICIAN:  Willow Ora, MD   REASON FOR PRESENTATION:  Evaluate patient with chest pain.   HISTORY OF PRESENT ILLNESS:  The patient was seen by [place] in consultation  on December 20. She was admitted with chest pain. She had a previous  diagnosis of non-obstructive coronary disease. The pain had some  worrisome features. Therefore, she underwent cardiac catheterization.  The report is not available, but I recall from Dr. Regino Schultze report and  the patient recalls that she was told she had normal coronaries. She  had normal left ventricular function.   Since being home, she has had one episode of chest discomfort while  seated. This was severe. It lasted for several minutes. It was sharp.  She bent over to get relief. It slowly went away. She has been able to  do active things without bringing on this discomfort. She has had no  associated nausea, vomiting, or diaphoresis such as she had previously.  She has had no palpitations, pre-syncope or syncope. She has had no PND  or orthopnea. She has had no problems at her catheterization site.   PAST MEDICAL HISTORY:  1. Depression/anxiety.  2. Cirrhosis.   ALLERGIES:  None.   MEDICATIONS:  1. Amlodipine 5 mg daily.  2. Cymbalta 90 mg daily.  3. Depakote 500 mg daily.  4. Effexor 37.5 mg daily.  5. Klonopin 1 mg q nightly.  6. Omeprazole 20 mg daily.  7. Chantix.   REVIEW OF SYSTEMS:  As stated in the HPI and otherwise negative for  other systems.   PHYSICAL EXAMINATION:  The patient is in no distress. Blood pressure is  153/86, heart rate is 77 and regular. Weight 178 pounds. Body mass index  is 26.  HEENT: Eyes unremarkable. Pupils equal, round, and reactive to light.  Fundi not  visualized. Oral mucosa is unremarkable.  NECK: No jugular venous distention.  Carotid upstroke brisk and  symmetrical. No bruits, no thyromegaly.  LYMPHATICS: No cervical, axillary or inguinal adenopathy.  LUNGS: Clear to auscultation bilaterally.  BACK: No costovertebral angle tenderness.  CHEST: Unremarkable.  HEART: PMI not displaced or sustained. S1, S2 within normal limits. No  S3. No S4. No clicks, rubs or murmurs.  ABDOMEN: Flat, positive bowel sounds, normal in frequency and pitch. No  bruits. No rebounds. No guarding. No midline pulsatile mass. No  hepatomegaly, splenomegaly.  SKIN: No rashes, no nodules.  EXTREMITIES: 2+ pulses throughout. No edema, cyanosis or clubbing.  NEURO: Oriented to person, place and time. Cranial nerves II-XII grossly  intact. Motor grossly intact throughout.   ASSESSMENT/PLAN:  1. Chest pain. The patient had no obstructive coronary disease. I      doubt any other cardiac etiology. She will continue to follow with      Dr. Drue Novel for evaluation of her non-anginal chest pain.  2. Hypertension. I did take the liberty of increasing her Norvasc to      7.5  daily. If she gets any increased edema with this perhaps she      can go back down to 5 and start a low-dose of diuretic. She will      follow with Dr. Drue Novel for management of this.  3. Tobacco. The patient is now taking Chantix and smoking only a few      cigarettes a day, which is much better. She understands that will      only be happy with complete abstinence.  4. Followup. Will see the patient back as needed. She will schedule to      followup with Dr. Drue Novel soon for followup of her blood pressure.     Rollene Rotunda, MD, [facility]  Electronically Signed    JH/MedQ  DD: 09/20/2006  DT: 09/20/2006  Job #: [id number]   cc:   Willow Ora, MD

## 2011-01-27 NOTE — Discharge Summary (Signed)
[name], [name]                ACCOUNT NO.:  [id number]   MEDICAL RECORD NO.:  [id number]          PATIENT TYPE:  INP   LOCATION:  3728                         FACILITY:  [facility]   PHYSICIAN:  Valerie A. Felicity Coyer, MDDATE OF BIRTH:  Jun 10, 1946   DATE OF ADMISSION:  DATE OF DISCHARGE:  08/31/2006                               DISCHARGE SUMMARY   DISCHARGE DIAGNOSES:  1. Atypical chest pain.  2. Hypertension.  3. History of depression/anxiety.   HISTORY OF PRESENT ILLNESS:  Ms. Wentland is a 65 year old female who  presented on August 30, 2006 with chief complaint of substernal chest  pain.  This pain radiated to her neck and pain, and lasted for  approximately 2 hours before spontaneous resolution.  She was admitted  for further evaluation and treatment.   PAST MEDICAL HISTORY:  1. Questionable history of hypertension.  2. Depression/anxiety.  3. Psoriasis.   COURSE OF HOSPITALIZATION:  Atypical chest pain:  The patient was  admitted, and underwent serial cardiac enzymes, which were negative x3.  She was noted to have a mild elevation of her LDL with a value of 114  and a total cholesterol of 195.  She underwent a cardiac  catheterization, which was reportedly negative.  However, final report  was unavailable at the time of this dictation.  The patient was  discharged to home.  Norvasc was added for blood pressure management  secondary to hypertension, and the patient's chest pain resolved.   MEDICATIONS AT THE TIME OF DISCHARGE:  1. Norvasc 5 mg p.o. daily.  2. Cymbalta 90 mg p.o. daily.  3. Depakote 500 mg p.o. q.h.s.  4. Effexor XR 37.5 mg p.o. daily.  5. Klonopin 1 mg p.o. q.h.s.  6. Protonix 40 mg p.o. daily.   DISPOSITION:  The patient is discharged to home.  She was instructed to  call Dr. Drue Novel in 1-2 weeks for a follow-up appointment to followup with  Dr. Antoine Poche on September 20, 2006.      Sandford Craze, NP      Raenette Rover. Felicity Coyer, MD  Electronically Signed    MO/MEDQ  D:  10/29/2006  T:  10/30/2006  Job:  [id number]   cc:   Vikki Ports A. Felicity Coyer, MD

## 2011-02-02 ENCOUNTER — Other Ambulatory Visit: Payer: Self-pay | Admitting: Internal Medicine

## 2011-02-02 NOTE — Telephone Encounter (Signed)
Message copied by Vernia Buff on Thu Feb 02, 2011  2:52 PM ------      Message from: [name]      Created: Thu Feb 02, 2011  1:16 PM      Regarding: refill on Zofran       It was a one time thing,      ----- Message -----         From: Vernia Buff, CMA         Sent: 02/02/2011  12:35 PM           To: Hart Carwin, MD            Dr Juanda Chance-      Patient was given #15 zofran at her office visit on 12/30/10 until her EGD. She is now requesting zofran refills. Do you want me to refill or was this a 1 time rx?

## 2011-02-02 NOTE — Telephone Encounter (Signed)
rx denied

## 2011-10-17 DIAGNOSIS — H5210 Myopia, unspecified eye: Secondary | ICD-10-CM | POA: Diagnosis not present

## 2012-02-06 DIAGNOSIS — F331 Major depressive disorder, recurrent, moderate: Secondary | ICD-10-CM | POA: Diagnosis not present

## 2012-02-20 DIAGNOSIS — F331 Major depressive disorder, recurrent, moderate: Secondary | ICD-10-CM | POA: Diagnosis not present

## 2012-02-27 DIAGNOSIS — F331 Major depressive disorder, recurrent, moderate: Secondary | ICD-10-CM | POA: Diagnosis not present

## 2012-02-29 DIAGNOSIS — F331 Major depressive disorder, recurrent, moderate: Secondary | ICD-10-CM | POA: Diagnosis not present

## 2012-03-06 DIAGNOSIS — F331 Major depressive disorder, recurrent, moderate: Secondary | ICD-10-CM | POA: Diagnosis not present

## 2012-03-19 DIAGNOSIS — F331 Major depressive disorder, recurrent, moderate: Secondary | ICD-10-CM | POA: Diagnosis not present

## 2012-03-27 DIAGNOSIS — F331 Major depressive disorder, recurrent, moderate: Secondary | ICD-10-CM | POA: Diagnosis not present

## 2012-04-09 DIAGNOSIS — IMO0002 Reserved for concepts with insufficient information to code with codable children: Secondary | ICD-10-CM | POA: Diagnosis not present

## 2012-04-10 DIAGNOSIS — F331 Major depressive disorder, recurrent, moderate: Secondary | ICD-10-CM | POA: Diagnosis not present

## 2012-04-17 DIAGNOSIS — F331 Major depressive disorder, recurrent, moderate: Secondary | ICD-10-CM | POA: Diagnosis not present

## 2012-04-23 DIAGNOSIS — F331 Major depressive disorder, recurrent, moderate: Secondary | ICD-10-CM | POA: Diagnosis not present

## 2012-04-29 ENCOUNTER — Ambulatory Visit (INDEPENDENT_AMBULATORY_CARE_PROVIDER_SITE_OTHER): Payer: Medicare Other | Admitting: Internal Medicine

## 2012-04-29 VITALS — BP 164/85 | HR 67 | Temp 97.6°F | Resp 18 | Ht 68.5 in | Wt 183.0 lb

## 2012-04-29 DIAGNOSIS — F419 Anxiety disorder, unspecified: Secondary | ICD-10-CM

## 2012-04-29 DIAGNOSIS — G2581 Restless legs syndrome: Secondary | ICD-10-CM | POA: Diagnosis not present

## 2012-04-29 DIAGNOSIS — F411 Generalized anxiety disorder: Secondary | ICD-10-CM

## 2012-04-29 MED ORDER — CLONAZEPAM 2 MG PO TABS: 2.0000 mg | ORAL_TABLET | Freq: Every day | ORAL | Status: DC

## 2012-04-29 MED ORDER — ROPINIROLE HCL 0.25 MG PO TABS: 0.2500 mg | ORAL_TABLET | Freq: Every day | ORAL | Status: DC

## 2012-04-29 NOTE — Progress Notes (Signed)
Here for medications Her psychiatrist in [facility] Dr. Florentina Addison has retired and she is awaiting followup with [facility] [name] Tera Partridge NP  On medication for depression anxiety and restless leg syndrome Unable to sleep the last 2 nights because she's run out Current outpatient prescriptions:Cimetidine (ACID REDUCER PO), Take by mouth. Takes one by mouth once daily, may take one depending on how bad she feels , Disp: , Rfl: ;  clonazePAM (KLONOPIN) 2 MG tablet, Take 1 tablet (2 mg total) by mouth at bedtime., Disp: 30 tablet, Rfl: 0;  divalproex (DEPAKOTE) 500 MG EC tablet, 2 by mouth once daily. , Disp: , Rfl: ;  DULoxetine (CYMBALTA) 60 MG capsule, Take 60 mg by mouth daily.  , Disp: , Rfl:  rOPINIRole (REQUIP) 0.25 MG tablet, Take 1 tablet (0.25 mg total) by mouth at bedtime., Disp: 90 tablet, Rfl: 1;  DISCONTD: clonazePAM (KLONOPIN) 2 MG tablet, Take 2 mg by mouth at bedtime.  , Disp: , Rfl: ;  DISCONTD: rOPINIRole (REQUIP) 0.25 MG tablet,  , Disp: , Rfl: ;  aspirin 81 MG tablet, Take 81 mg by mouth daily.  , Disp: , Rfl:  HYDROcodone-acetaminophen (VICODIN) 2.5-500 MG per tablet, 1 or 2 by mouth every 6 hours as needed. , Disp: , Rfl: ;  methotrexate 2.5 MG tablet,  , Disp: , Rfl: ;  omeprazole (PRILOSEC) 20 MG capsule, Take 1 capsule (20 mg total) by mouth daily., Disp: 10 capsule, Rfl: 0;  ondansetron (ZOFRAN) 4 MG tablet, Take 1 tablet by mouth every 8 hours as needed for nausea, Disp: 15 tablet, Rfl: 0 pravastatin (PRAVACHOL) 40 MG tablet, Take 40 mg by mouth at bedtime. Watch for muscle aches , Disp: , Rfl: ;  promethazine (PHENERGAN) 25 MG tablet, Take 25 mg by mouth every 6 (six) hours as needed.  , Disp: , Rfl:    Past medical history Patient Active Problem List  Diagnosis  . DEPRESSION  . HYPERTENSION  . CAROTID ARTERY DISEASE  . CONSTIPATION, CHRONIC  . BACK PAIN  . HEADACHE  . CHEST PAIN  . NAUSEA  . DEGENERATIVE JOINT DISEASE  . Abdominal  pain  . RLS (restless legs syndrome)    Physical examination- Filed Vitals:   04/29/12 1835  BP: 164/85  Pulse: 67  Temp: 97.6 F (36.4 C)  Resp: 18   In no acute distress Mood and affect stable   Problem #1 anxiety #2 restless leg syndrome Meds ordered this encounter  Medications  . rOPINIRole (REQUIP) 0.25 MG tablet    Sig: Take 1 tablet (0.25 mg total) by mouth at bedtime.    Dispense:  90 tablet    Refill:  1  . clonazePAM (KLONOPIN) 2 MG tablet    Sig: Take 1 tablet (2 mg total) by mouth at bedtime.    Dispense:  30 tablet    Refill:  0   She has her other medicines

## 2012-04-30 ENCOUNTER — Telehealth: Payer: Self-pay

## 2012-04-30 DIAGNOSIS — IMO0002 Reserved for concepts with insufficient information to code with codable children: Secondary | ICD-10-CM | POA: Diagnosis not present

## 2012-04-30 MED ORDER — ROPINIROLE HCL 2 MG PO TABS: 2.0000 mg | ORAL_TABLET | Freq: Every day | ORAL | Status: DC

## 2012-04-30 NOTE — Telephone Encounter (Signed)
Left message for pharmacy to call back so I can try to clarify this.

## 2012-04-30 NOTE — Telephone Encounter (Signed)
Verified with the pharmacy that patient has been on Requip 2 mg. Will change this to 2 mg.

## 2012-04-30 NOTE — Telephone Encounter (Signed)
[facility] called because pt states she was given wrong Rx. Would like to clarify. Please [facility] on [address].

## 2012-04-30 NOTE — Telephone Encounter (Signed)
Pharmacy has called about Requip, pt was taking 2mg  from another provider, Dr Merla Riches prescribed 0.25mg , is it okay to change to 2mg ?

## 2012-05-16 DIAGNOSIS — R5383 Other fatigue: Secondary | ICD-10-CM | POA: Diagnosis not present

## 2012-05-16 DIAGNOSIS — D320 Benign neoplasm of cerebral meninges: Secondary | ICD-10-CM | POA: Diagnosis not present

## 2012-05-16 DIAGNOSIS — F29 Unspecified psychosis not due to a substance or known physiological condition: Secondary | ICD-10-CM | POA: Diagnosis not present

## 2012-05-16 DIAGNOSIS — F488 Other specified nonpsychotic mental disorders: Secondary | ICD-10-CM | POA: Diagnosis not present

## 2012-05-16 DIAGNOSIS — Z8673 Personal history of transient ischemic attack (TIA), and cerebral infarction without residual deficits: Secondary | ICD-10-CM | POA: Diagnosis not present

## 2012-05-16 DIAGNOSIS — F341 Dysthymic disorder: Secondary | ICD-10-CM | POA: Diagnosis not present

## 2012-05-16 DIAGNOSIS — I6350 Cerebral infarction due to unspecified occlusion or stenosis of unspecified cerebral artery: Secondary | ICD-10-CM | POA: Diagnosis not present

## 2012-05-16 DIAGNOSIS — R42 Dizziness and giddiness: Secondary | ICD-10-CM | POA: Diagnosis not present

## 2012-05-16 DIAGNOSIS — R5381 Other malaise: Secondary | ICD-10-CM | POA: Diagnosis not present

## 2012-05-16 DIAGNOSIS — R51 Headache: Secondary | ICD-10-CM | POA: Diagnosis not present

## 2012-05-16 DIAGNOSIS — G2581 Restless legs syndrome: Secondary | ICD-10-CM | POA: Diagnosis not present

## 2012-05-16 DIAGNOSIS — R4789 Other speech disturbances: Secondary | ICD-10-CM | POA: Diagnosis not present

## 2012-05-17 DIAGNOSIS — R51 Headache: Secondary | ICD-10-CM | POA: Diagnosis not present

## 2012-05-17 DIAGNOSIS — F29 Unspecified psychosis not due to a substance or known physiological condition: Secondary | ICD-10-CM | POA: Diagnosis not present

## 2012-05-17 DIAGNOSIS — G459 Transient cerebral ischemic attack, unspecified: Secondary | ICD-10-CM | POA: Diagnosis not present

## 2012-05-17 DIAGNOSIS — I6350 Cerebral infarction due to unspecified occlusion or stenosis of unspecified cerebral artery: Secondary | ICD-10-CM | POA: Diagnosis not present

## 2012-05-17 DIAGNOSIS — G2581 Restless legs syndrome: Secondary | ICD-10-CM | POA: Diagnosis not present

## 2012-05-17 DIAGNOSIS — I6789 Other cerebrovascular disease: Secondary | ICD-10-CM | POA: Diagnosis not present

## 2012-05-17 DIAGNOSIS — I10 Essential (primary) hypertension: Secondary | ICD-10-CM | POA: Diagnosis not present

## 2012-05-17 DIAGNOSIS — R42 Dizziness and giddiness: Secondary | ICD-10-CM | POA: Diagnosis not present

## 2012-05-18 DIAGNOSIS — G2581 Restless legs syndrome: Secondary | ICD-10-CM | POA: Diagnosis not present

## 2012-05-18 DIAGNOSIS — I10 Essential (primary) hypertension: Secondary | ICD-10-CM | POA: Diagnosis not present

## 2012-05-18 DIAGNOSIS — G459 Transient cerebral ischemic attack, unspecified: Secondary | ICD-10-CM | POA: Diagnosis not present

## 2012-05-18 DIAGNOSIS — I6789 Other cerebrovascular disease: Secondary | ICD-10-CM | POA: Diagnosis not present

## 2012-05-18 DIAGNOSIS — R51 Headache: Secondary | ICD-10-CM | POA: Diagnosis not present

## 2012-05-18 DIAGNOSIS — R42 Dizziness and giddiness: Secondary | ICD-10-CM | POA: Diagnosis not present

## 2012-05-27 ENCOUNTER — Other Ambulatory Visit: Payer: Self-pay | Admitting: Physician Assistant

## 2012-05-27 NOTE — Telephone Encounter (Signed)
Rx'd #90 1 refill on 8/19, then #30 on 8/20-- was 8/19 rx discontinued??

## 2012-05-28 NOTE — Telephone Encounter (Signed)
Correct 04/29/12 Rx was discontinued. Because patient had previously been on a 2 mg tab, not a 0.25 mg tab.

## 2012-06-17 DIAGNOSIS — IMO0002 Reserved for concepts with insufficient information to code with codable children: Secondary | ICD-10-CM | POA: Diagnosis not present

## 2012-07-08 DIAGNOSIS — IMO0002 Reserved for concepts with insufficient information to code with codable children: Secondary | ICD-10-CM | POA: Diagnosis not present

## 2012-07-24 DIAGNOSIS — IMO0002 Reserved for concepts with insufficient information to code with codable children: Secondary | ICD-10-CM | POA: Diagnosis not present

## 2012-07-24 DIAGNOSIS — J209 Acute bronchitis, unspecified: Secondary | ICD-10-CM | POA: Diagnosis not present

## 2012-07-24 DIAGNOSIS — I10 Essential (primary) hypertension: Secondary | ICD-10-CM | POA: Diagnosis not present

## 2012-07-24 DIAGNOSIS — H6120 Impacted cerumen, unspecified ear: Secondary | ICD-10-CM | POA: Diagnosis not present

## 2012-08-02 DIAGNOSIS — I10 Essential (primary) hypertension: Secondary | ICD-10-CM | POA: Diagnosis not present

## 2012-08-02 DIAGNOSIS — E785 Hyperlipidemia, unspecified: Secondary | ICD-10-CM | POA: Diagnosis not present

## 2012-08-02 DIAGNOSIS — IMO0002 Reserved for concepts with insufficient information to code with codable children: Secondary | ICD-10-CM | POA: Diagnosis not present

## 2012-08-02 DIAGNOSIS — H6120 Impacted cerumen, unspecified ear: Secondary | ICD-10-CM | POA: Diagnosis not present

## 2012-08-05 DIAGNOSIS — M79609 Pain in unspecified limb: Secondary | ICD-10-CM | POA: Diagnosis not present

## 2012-08-05 DIAGNOSIS — M62838 Other muscle spasm: Secondary | ICD-10-CM | POA: Diagnosis not present

## 2012-08-05 DIAGNOSIS — I10 Essential (primary) hypertension: Secondary | ICD-10-CM | POA: Diagnosis not present

## 2012-08-05 DIAGNOSIS — IMO0002 Reserved for concepts with insufficient information to code with codable children: Secondary | ICD-10-CM | POA: Diagnosis not present

## 2012-08-17 ENCOUNTER — Ambulatory Visit (INDEPENDENT_AMBULATORY_CARE_PROVIDER_SITE_OTHER): Payer: Medicare Other | Admitting: Internal Medicine

## 2012-08-17 VITALS — BP 130/74 | HR 68 | Temp 98.1°F | Resp 16 | Ht 67.0 in | Wt 188.0 lb

## 2012-08-17 DIAGNOSIS — L03119 Cellulitis of unspecified part of limb: Secondary | ICD-10-CM

## 2012-08-17 DIAGNOSIS — L02419 Cutaneous abscess of limb, unspecified: Secondary | ICD-10-CM

## 2012-08-17 MED ORDER — CEPHALEXIN 500 MG PO CAPS: 500.0000 mg | ORAL_CAPSULE | Freq: Three times a day (TID) | ORAL | Status: DC

## 2012-08-17 NOTE — Progress Notes (Signed)
  Subjective:    Patient ID: [name], female    DOB: [birth year], 66 y.o.   MRN: [id number]  HPI Patient cut her left lower leg 2-3  weeks ago and had sutures placed and subsequently removed at Dr Jearl Klinefelter. Patient is worried that the sutures were not correctly placed because of the appearance of the wound. Patient has questions to ensure that she is able to go about her daily activities as normal while the wound heals. Patient states that the wound still is seeping blood. Patient is not currently taking any type of coagulant. She also has questions about correct wound care.   Patient Active Problem List  Diagnosis  . DEPRESSION  . HYPERTENSION  . CAROTID ARTERY DISEASE  . CONSTIPATION, CHRONIC  . BACK PAIN  . HEADACHE  . CHEST PAIN  . NAUSEA  . DEGENERATIVE JOINT DISEASE  . Abdominal pain  . RLS (restless legs syndrome)    Review of Systems No fever chills or night sweats No sensory losses inlower extremity    Objective:   Physical Exam Vital signs stable In the inner aspect of the left lower extremity has a 2 cm x 2 cm x 2 cm triangular wound. The surface is a black eschar. Surrounded by 2 cm of erythema on all sides that is tender to palpation No abscess formation No pus or blood expressible       Assessment & Plan:  Problem #1 secondary wound infection Wound care described especially soaking 2-3 times a day to remove eschar Compression bandages needed Keflex 500 3 times a day for 10 days Recheck one week if not progressing to wellness

## 2012-08-28 DIAGNOSIS — F331 Major depressive disorder, recurrent, moderate: Secondary | ICD-10-CM | POA: Diagnosis not present

## 2012-11-12 DIAGNOSIS — F331 Major depressive disorder, recurrent, moderate: Secondary | ICD-10-CM | POA: Diagnosis not present

## 2012-11-19 DIAGNOSIS — F332 Major depressive disorder, recurrent severe without psychotic features: Secondary | ICD-10-CM | POA: Diagnosis not present

## 2012-11-27 DIAGNOSIS — F331 Major depressive disorder, recurrent, moderate: Secondary | ICD-10-CM | POA: Diagnosis not present

## 2012-12-10 DIAGNOSIS — F331 Major depressive disorder, recurrent, moderate: Secondary | ICD-10-CM | POA: Diagnosis not present

## 2012-12-11 DIAGNOSIS — IMO0002 Reserved for concepts with insufficient information to code with codable children: Secondary | ICD-10-CM | POA: Diagnosis not present

## 2012-12-25 ENCOUNTER — Ambulatory Visit (INDEPENDENT_AMBULATORY_CARE_PROVIDER_SITE_OTHER): Payer: Medicare Other | Admitting: Family Medicine

## 2012-12-25 ENCOUNTER — Ambulatory Visit: Payer: Medicare Other

## 2012-12-25 VITALS — BP 138/80 | HR 85 | Temp 97.8°F | Resp 18 | Ht 68.0 in | Wt 197.0 lb

## 2012-12-25 DIAGNOSIS — G629 Polyneuropathy, unspecified: Secondary | ICD-10-CM

## 2012-12-25 DIAGNOSIS — M25569 Pain in unspecified knee: Secondary | ICD-10-CM | POA: Diagnosis not present

## 2012-12-25 DIAGNOSIS — L089 Local infection of the skin and subcutaneous tissue, unspecified: Secondary | ICD-10-CM

## 2012-12-25 DIAGNOSIS — E663 Overweight: Secondary | ICD-10-CM | POA: Diagnosis not present

## 2012-12-25 DIAGNOSIS — G609 Hereditary and idiopathic neuropathy, unspecified: Secondary | ICD-10-CM | POA: Diagnosis not present

## 2012-12-25 DIAGNOSIS — M25562 Pain in left knee: Secondary | ICD-10-CM

## 2012-12-25 LAB — POCT CBC
HCT, POC: 43.8 % (ref 37.7–47.9)
Hemoglobin: 13.3 g/dL (ref 12.2–16.2)
Lymph, poc: 2.3 (ref 0.6–3.4)
MCH, POC: 28.1 pg (ref 27–31.2)
MCHC: 30.4 g/dL — AB (ref 31.8–35.4)
MCV: 92.4 fL (ref 80–97)
MPV: 7.5 fL (ref 0–99.8)
POC MID %: 8.4 %M (ref 0–12)
RBC: 4.74 M/uL (ref 4.04–5.48)
WBC: 7.7 10*3/uL (ref 4.6–10.2)

## 2012-12-25 MED ORDER — AMOXICILLIN-POT CLAVULANATE 875-125 MG PO TABS: 1.0000 | ORAL_TABLET | Freq: Two times a day (BID) | ORAL | Status: DC

## 2012-12-25 NOTE — Patient Instructions (Addendum)
We are going to use agumentin for your leg wound. Keep the wound clean and covered.  Please come and see me on Friday and we will check on your progress- sooner if you have any worsening of your symptoms.    You might also use some probiotics to help prevent diarrhea.

## 2012-12-25 NOTE — Progress Notes (Signed)
Subjective:    Patient ID: [name], female    DOB: 08-08-1946, 67 y.o.   MRN: [id number]  HPI 67 yo female who cut leg stepping out of bed in November. Went to another [facility] clinic who stitched a patch of skin over initial wound site. The skin flap then died after the first week. Was "fine" for a while. Came here in December and was noted to have a secondary wound infection, and was treated with keflex.   Did well again for a time, but reports that is has been getting increasing more infected over the last couple weeks. Pain was "unbearable" last night. This morning she pulled off the bandage and the scab came loose, releasing a small amount of pus.    Reports swelling from wound site down into foot. Redness around wound has been spreading. Tops of toes occasionally turn brown- both feet.  Does report that she has had poor circulation in toes as long as she can remember and that her toes are "always freezing."   Feel like it is affecting whole body. Gets fatigued easily. Feels unsteady walking. Feel less "mentally sharp".  Was given Keflex by Dr. Merla Riches in December. Does not remember getting antibiotic then. Said it didn't help.   Review of Systems  Constitutional: Positive for fatigue. Negative for fever and chills.  Respiratory: Negative.   Cardiovascular: Positive for leg swelling (L leg). Palpitations: at night ongoing for years. Thinks it is reflux.  Gastrointestinal: Positive for nausea, abdominal pain and constipation. Negative for vomiting.  Musculoskeletal: Positive for gait problem.  Neurological: Positive for dizziness.       Objective:   Physical Exam  Constitutional: She appears well-developed and well-nourished.  Cardiovascular: Normal rate, regular rhythm and normal heart sounds.   Pulmonary/Chest: Effort normal and breath sounds normal.  Musculoskeletal:       Left lower leg: She exhibits tenderness, swelling and edema.       Legs: Single draining ulcer as  depicted. Swelling and warmth around ulcer. Swelling down into ankle and top of foot. Toes are darkened and cold.    addnd to PE by JC: toes of BOTH feet are cool and slightly dusky, but have good cap refill and strong DP pulses.  She reports this is normal for her toes. Small, tender wound on medial left shin.  No purulent drainage at this time, minimal erythema and trace swelling of ankle and foot.    [facility] reading (PRIMARY) by  Dr. Patsy Lager. Left tib/ fib: negative  LEFT TIBIA AND FIBULA - 2 VIEW  Comparison: None.  Findings: Two views of the left tibia-fibula submitted. No acute fracture or subluxation. No periosteal reaction or bony erosion.  IMPRESSION: No acute fracture or subluxation.   Results for orders placed in visit on 12/25/12  POCT CBC      Result Value Range   WBC 7.7  4.6 - 10.2 K/uL   Lymph, poc 2.3  0.6 - 3.4   POC LYMPH PERCENT 29.3  10 - 50 %L   MID (cbc) 0.6  0 - 0.9   POC MID % 8.4  0 - 12 %M   POC Granulocyte 4.8  2 - 6.9   Granulocyte percent 62.3  37 - 80 %G   RBC 4.74  4.04 - 5.48 M/uL   Hemoglobin 13.3  12.2 - 16.2 g/dL   HCT, POC 40.9  81.1 - 47.9 %   MCV 92.4  80 - 97 fL   MCH, POC  28.1  27 - 31.2 pg   MCHC 30.4 (*) 31.8 - 35.4 g/dL   RDW, POC 82.9     Platelet Count, POC 267  142 - 424 K/uL   MPV 7.5  0 - 99.8 fL  POCT GLYCOSYLATED HEMOGLOBIN (HGB A1C)      Result Value Range   Hemoglobin A1C 5.6         Assessment & Plan:  Wound was explored after irrigation with 3-4 ml of 1% lidocaine. Unable to express any purulence, wound is very shallow. Culture was obtained. Xrays obtained to rule out bone process such as osteomyelitis. A1c obtained, as patient has never been tested for diabetes.  Pain in joint, lower leg, left - Plan: POCT glycosylated hemoglobin (Hb A1C), amoxicillin-clavulanate (AUGMENTIN) 875-125 MG per tablet  Infection of anterior lower leg - Plan: POCT CBC, Wound culture, DG Tibia/Fibula Left, POCT glycosylated hemoglobin (Hb  A1C), amoxicillin-clavulanate (AUGMENTIN) 875-125 MG per tablet  Peripheral neuropathy - Plan: POCT glycosylated hemoglobin (Hb A1C)  Overweight - Plan: POCT glycosylated hemoglobin (Hb A1C)  Will treat with augmentin for persistent wound with apparent purulent drainage earlier today.  Negative for DM, CBC reassuring.  Plan recheck here in 48 hours, Sooner if worse.

## 2012-12-27 ENCOUNTER — Encounter: Payer: Self-pay | Admitting: Family Medicine

## 2012-12-27 ENCOUNTER — Ambulatory Visit (INDEPENDENT_AMBULATORY_CARE_PROVIDER_SITE_OTHER): Payer: Medicare Other | Admitting: Family Medicine

## 2012-12-27 VITALS — BP 150/90 | HR 80 | Temp 97.9°F | Resp 16 | Ht 68.0 in | Wt 197.0 lb

## 2012-12-27 DIAGNOSIS — S81802D Unspecified open wound, left lower leg, subsequent encounter: Secondary | ICD-10-CM

## 2012-12-27 DIAGNOSIS — R82998 Other abnormal findings in urine: Secondary | ICD-10-CM

## 2012-12-27 DIAGNOSIS — Z5189 Encounter for other specified aftercare: Secondary | ICD-10-CM | POA: Diagnosis not present

## 2012-12-27 DIAGNOSIS — R8290 Unspecified abnormal findings in urine: Secondary | ICD-10-CM

## 2012-12-27 DIAGNOSIS — R3129 Other microscopic hematuria: Secondary | ICD-10-CM | POA: Diagnosis not present

## 2012-12-27 LAB — POCT URINALYSIS DIPSTICK
Bilirubin, UA: NEGATIVE
Glucose, UA: NEGATIVE
Ketones, UA: NEGATIVE
pH, UA: 7

## 2012-12-27 LAB — POCT UA - MICROSCOPIC ONLY

## 2012-12-27 NOTE — Progress Notes (Addendum)
[facility] [address] [place] [address] [phone]  Date:  12/27/2012   Name:  [name]   DOB:  [birth year]   MRN:  [id number]  PCP:  Willow Ora, MD    Chief Complaint: Follow-up   History of Present Illness:  [name] is a 67 y.o. very pleasant female patient who presents with the following:  Here for a recheck of wound on her left leg- see OV from 2 days ago.  She is tolerating the augmentin well, and feels that the wound is somewhat better.  Her pain is less and she does not have any flu like symptoms any more, although she does still feel tired.    She also notes that a couple of days ago she noted "half- almond shaped objects" in her urine.  She just observed these objects, but did not have any pain or other urinary symptoms.  This has not happened again  Patient Active Problem List  Diagnosis  . DEPRESSION  . HYPERTENSION  . CAROTID ARTERY DISEASE  . CONSTIPATION, CHRONIC  . BACK PAIN  . HEADACHE  . CHEST PAIN  . NAUSEA  . DEGENERATIVE JOINT DISEASE  . Abdominal pain  . RLS (restless legs syndrome)    Past Medical History  Diagnosis Date  . Hypertension   . Constipation     chronic;severe  . CAD (coronary artery disease)     intra and extracranial vascular dx per MRI 4/11, neurology rec strict CVRF control  . Depression   . Psoriasis     sees derm  . EKG abnormalities     changes, stress test neg (false EKG changes)  . Colonic inertia   . Bipolar disorder   . GERD (gastroesophageal reflux disease)     Past Surgical History  Procedure Laterality Date  . Tubal ligation    . Arthroscopy  04/2010    Right knee    History  Substance Use Topics  . Smoking status: Current Every Day Smoker    Types: Cigarettes  . Smokeless tobacco: Never Used  . Alcohol Use: 1.2 oz/week    1 Cans of beer, 1 Glasses of wine per week     Comment: yes on occassion    Family History  Problem Relation Age of Onset  . Breast cancer  Mother     metastisis to bones  . Diabetes Son   . Heart disease      grandfather   . Alcohol abuse Brother   . Colon cancer Neg Hx   . Heart disease Maternal Aunt     No Known Allergies  Medication list has been reviewed and updated.  Current Outpatient Prescriptions on File Prior to Visit  Medication Sig Dispense Refill  . amoxicillin-clavulanate (AUGMENTIN) 875-125 MG per tablet Take 1 tablet by mouth 2 (two) times daily.  20 tablet  0  . aspirin 81 MG tablet Take 81 mg by mouth daily.        . Cimetidine (ACID REDUCER PO) Take by mouth. Takes one by mouth once daily, may take one depending on how bad she feels       . clonazePAM (KLONOPIN) 2 MG tablet Take 1 tablet (2 mg total) by mouth at bedtime.  30 tablet  0  . DULoxetine (CYMBALTA) 60 MG capsule Take 60 mg by mouth daily.        . naproxen sodium (ANAPROX) 220 MG tablet Take 220 mg by mouth 2 (two)  times daily with a meal.      . omeprazole (PRILOSEC) 20 MG capsule Take 1 capsule (20 mg total) by mouth daily.  10 capsule  0  . rOPINIRole (REQUIP) 2 MG tablet TAKE 1 TABLET BY MOUTH AT BEDTIME  30 tablet  0   No current facility-administered medications on file prior to visit.    Review of Systems:  As per HPI- otherwise negative.   Physical Examination: Filed Vitals:   12/27/12 1420  BP: 171/81  Pulse: 80  Temp: 97.9 F (36.6 C)  Resp: 16   Filed Vitals:   12/27/12 1420  Height: 5\' 8"  (1.727 m)  Weight: 197 lb (89.359 kg)   Body mass index is 29.96 kg/(m^2). Ideal Body Weight: Weight in (lb) to have BMI = 25: 164.1  GEN: WDWN, NAD, Non-toxic, A & O x 3, overweight HEENT: Atraumatic, Normocephalic. Neck supple. No masses, No LAD. Ears and Nose: No external deformity. CV: RRR, No M/G/R. No JVD. No thrill. No extra heart sounds. PULM: CTA B, no wheezes, crackles, rhonchi. No retractions. No resp. distress. No accessory muscle use. EXTR: No c/c/e NEURO Normal gait.  PSYCH: Normally interactive. Conversant.  Not depressed or anxious appearing.  Calm demeanor.  Left medial shin displays a healing, shallow wound.  Less red and less tender around wound.      Results for orders placed in visit on 12/27/12  POCT UA - MICROSCOPIC ONLY      Result Value Range   WBC, Ur, HPF, POC 0-3     RBC, urine, microscopic 1-8     Bacteria, U Microscopic trace     Mucus, UA trace     Epithelial cells, urine per micros 0-2     Crystals, Ur, HPF, POC neg     Casts, Ur, LPF, POC neg     Yeast, UA neg    POCT URINALYSIS DIPSTICK      Result Value Range   Color, UA yellow     Clarity, UA clear     Glucose, UA neg     Bilirubin, UA neg     Ketones, UA neg     Spec Grav, UA <=1.005     Blood, UA small     pH, UA 7.0     Protein, UA neg     Urobilinogen, UA 0.2     Nitrite, UA neg     Leukocytes, UA Trace      Assessment and Plan: Wound, open, leg, left, subsequent encounter  Abnormal urine - Plan: POCT UA - Microscopic Only, POCT urinalysis dipstick  Microhematuria - Plan: Urine culture  Leg wound is healing.  Placed silvadene and then dressed today.  Gave her a small amount of silvadene in a sterile cup to use at home.   Unusual urine finding: nothing abnormal on micro today.  She does have microhematuria- await urine culture.  Already on augmenin  Signed Abbe Amsterdam, MD  4/21- will refer to urology due to microhematuria/ pt reports unusual urinary findings and negative urine culture.

## 2012-12-29 LAB — URINE CULTURE
Colony Count: NO GROWTH
Organism ID, Bacteria: NO GROWTH

## 2012-12-29 LAB — WOUND CULTURE: Gram Stain: NONE SEEN

## 2012-12-30 ENCOUNTER — Telehealth: Payer: Self-pay | Admitting: Family Medicine

## 2012-12-30 ENCOUNTER — Encounter: Payer: Self-pay | Admitting: Family Medicine

## 2012-12-30 NOTE — Addendum Note (Signed)
Addended by: Abbe Amsterdam C on: 12/30/2012 09:33 AM   Modules accepted: Orders

## 2012-12-30 NOTE — Telephone Encounter (Signed)
Called her at home to discuss- augmentin should take care of her infection. Please come and see me if not continuing to heal- perhaps recheck in one week, sooner if needed

## 2012-12-30 NOTE — Telephone Encounter (Signed)
[facility] called back and said that augmentin was not suceptible to staff.

## 2012-12-30 NOTE — Telephone Encounter (Signed)
Called ID pharmacist at [facility]- Augmentin should actually be a fine agent for Staph aureus.  Will continue her current abx.

## 2013-01-01 DIAGNOSIS — IMO0002 Reserved for concepts with insufficient information to code with codable children: Secondary | ICD-10-CM | POA: Diagnosis not present

## 2013-01-08 ENCOUNTER — Ambulatory Visit (INDEPENDENT_AMBULATORY_CARE_PROVIDER_SITE_OTHER): Payer: Medicare Other | Admitting: Family Medicine

## 2013-01-08 VITALS — BP 160/90 | HR 75 | Temp 98.4°F | Resp 18 | Ht 67.5 in | Wt 196.0 lb

## 2013-01-08 DIAGNOSIS — S81802D Unspecified open wound, left lower leg, subsequent encounter: Secondary | ICD-10-CM

## 2013-01-08 DIAGNOSIS — Z5189 Encounter for other specified aftercare: Secondary | ICD-10-CM | POA: Diagnosis not present

## 2013-01-08 NOTE — Progress Notes (Signed)
[facility] [address] [place] [address] [phone]  Date:  01/08/2013   Name:  [name]   DOB:  [birth year]   MRN:  [id number]  PCP:  Willow Ora, MD    Chief Complaint: Wound Check   History of Present Illness:  [name] is a 67 y.o. very pleasant female patient who presents with the following:  Here to recheck wound on her leg.  She was here 4/16 with complaint of wound on her left lower leg.  We did a culture and started her on augmentin.  Seen back 2 days later and noted to be doing better  Patient Active Problem List   Diagnosis Date Noted  . RLS (restless legs syndrome) 04/29/2012  . Abdominal pain 12/27/2010  . DEGENERATIVE JOINT DISEASE 09/23/2010  . CAROTID ARTERY DISEASE 08/15/2010  . CHEST PAIN 08/15/2010  . HEADACHE 12/02/2009  . BACK PAIN 11/22/2009  . CONSTIPATION, CHRONIC 01/29/2008  . NAUSEA 01/28/2008  . DEPRESSION 03/08/2007  . HYPERTENSION 03/08/2007    Past Medical History  Diagnosis Date  . Hypertension   . Constipation     chronic;severe  . CAD (coronary artery disease)     intra and extracranial vascular dx per MRI 4/11, neurology rec strict CVRF control  . Depression   . Psoriasis     sees derm  . EKG abnormalities     changes, stress test neg (false EKG changes)  . Colonic inertia   . Bipolar disorder   . GERD (gastroesophageal reflux disease)     Past Surgical History  Procedure Laterality Date  . Tubal ligation    . Arthroscopy  04/2010    Right knee    History  Substance Use Topics  . Smoking status: Current Every Day Smoker    Types: Cigarettes  . Smokeless tobacco: Never Used  . Alcohol Use: 1.2 oz/week    1 Cans of beer, 1 Glasses of wine per week     Comment: yes on occassion    Family History  Problem Relation Age of Onset  . Breast cancer Mother     metastisis to bones  . Diabetes Son   . Heart disease      grandfather   . Alcohol abuse Brother   . Colon cancer Neg Hx   .  Heart disease Maternal Aunt     No Known Allergies  Medication list has been reviewed and updated.  Current Outpatient Prescriptions on File Prior to Visit  Medication Sig Dispense Refill  . aspirin 81 MG tablet Take 81 mg by mouth daily.        . Cimetidine (ACID REDUCER PO) Take by mouth. Takes one by mouth once daily, may take one depending on how bad she feels       . clonazePAM (KLONOPIN) 2 MG tablet Take 1 tablet (2 mg total) by mouth at bedtime.  30 tablet  0  . DULoxetine (CYMBALTA) 60 MG capsule Take 60 mg by mouth daily.        Marland Kitchen rOPINIRole (REQUIP) 2 MG tablet TAKE 1 TABLET BY MOUTH AT BEDTIME  30 tablet  0  . amoxicillin-clavulanate (AUGMENTIN) 875-125 MG per tablet Take 1 tablet by mouth 2 (two) times daily.  20 tablet  0  . naproxen sodium (ANAPROX) 220 MG tablet Take 220 mg by mouth 2 (two) times daily with a meal.      . omeprazole (PRILOSEC) 20 MG capsule Take 1 capsule (  20 mg total) by mouth daily.  10 capsule  0   No current facility-administered medications on file prior to visit.    Review of Systems:  As per HPI- otherwise negative.   Physical Examination: Filed Vitals:   01/08/13 1539  BP: 172/80  Pulse: 75  Temp: 98.4 F (36.9 C)  Resp: 18   Filed Vitals:   01/08/13 1539  Height: 5' 7.5" (1.715 m)  Weight: 196 lb (88.905 kg)   Body mass index is 30.23 kg/(m^2). Ideal Body Weight: Weight in (lb) to have BMI = 25: 161.7  GEN: WDWN, NAD, Non-toxic, A & O x 3, overweight HEENT: Atraumatic, Normocephalic. Neck supple. No masses, No LAD. Ears and Nose: No external deformity. CV: RRR, No M/G/R. No JVD. No thrill. No extra heart sounds. PULM: CTA B, no wheezes, crackles, rhonchi. No retractions. No resp. distress. No accessory muscle use. EXTR: No c/c/e NEURO Normal gait.  PSYCH: Normally interactive. Conversant. Not depressed or anxious appearing.  Calm demeanor.  Left leg, inner shin: there is a healing wound.  It looks improved from before, and  the ulcerated area appears to be closing.  Minimal edema both ankles  Assessment and Plan: Leg wound, left, subsequent encounter  Reassured that I do think her wound is healing.  Encouraged her to leave it open to the air when at home (she is still keeping it bandaged at all times with silvadene).   She will let me know if her would does not continue to heal.  If she likes I am glad to refer her to wound care.    She is aware that her BP is too high, but has not wanted to be treated for this.  She will continue to watch this with her regular doctor.    Signed Abbe Amsterdam, MD

## 2013-01-14 ENCOUNTER — Telehealth: Payer: Self-pay

## 2013-01-14 DIAGNOSIS — S81802S Unspecified open wound, left lower leg, sequela: Secondary | ICD-10-CM

## 2013-01-14 NOTE — Telephone Encounter (Signed)
Dr copland patient would like you to call her regarding a referral please call [phone]

## 2013-01-15 ENCOUNTER — Ambulatory Visit: Payer: Medicare Other

## 2013-01-15 ENCOUNTER — Ambulatory Visit (INDEPENDENT_AMBULATORY_CARE_PROVIDER_SITE_OTHER): Payer: Medicare Other | Admitting: Family Medicine

## 2013-01-15 VITALS — BP 158/100 | HR 70 | Temp 98.4°F | Resp 16 | Ht 67.5 in | Wt 194.0 lb

## 2013-01-15 DIAGNOSIS — R05 Cough: Secondary | ICD-10-CM | POA: Diagnosis not present

## 2013-01-15 DIAGNOSIS — R5381 Other malaise: Secondary | ICD-10-CM

## 2013-01-15 DIAGNOSIS — N390 Urinary tract infection, site not specified: Secondary | ICD-10-CM | POA: Diagnosis not present

## 2013-01-15 DIAGNOSIS — R110 Nausea: Secondary | ICD-10-CM

## 2013-01-15 DIAGNOSIS — R509 Fever, unspecified: Secondary | ICD-10-CM | POA: Diagnosis not present

## 2013-01-15 DIAGNOSIS — R197 Diarrhea, unspecified: Secondary | ICD-10-CM | POA: Diagnosis not present

## 2013-01-15 DIAGNOSIS — R5383 Other fatigue: Secondary | ICD-10-CM | POA: Diagnosis not present

## 2013-01-15 DIAGNOSIS — R059 Cough, unspecified: Secondary | ICD-10-CM

## 2013-01-15 DIAGNOSIS — R531 Weakness: Secondary | ICD-10-CM

## 2013-01-15 LAB — POCT URINALYSIS DIPSTICK
Bilirubin, UA: NEGATIVE
Glucose, UA: NEGATIVE
Nitrite, UA: NEGATIVE
Urobilinogen, UA: 0.2
pH, UA: 7

## 2013-01-15 LAB — POCT CBC
HCT, POC: 44.9 % (ref 37.7–47.9)
Hemoglobin: 13.6 g/dL (ref 12.2–16.2)
Lymph, poc: 1.8 (ref 0.6–3.4)
MCH, POC: 28.4 pg (ref 27–31.2)
MCHC: 30.3 g/dL — AB (ref 31.8–35.4)
MCV: 93.7 fL (ref 80–97)
WBC: 6 10*3/uL (ref 4.6–10.2)

## 2013-01-15 LAB — POCT UA - MICROSCOPIC ONLY
Casts, Ur, LPF, POC: NEGATIVE
Crystals, Ur, HPF, POC: NEGATIVE

## 2013-01-15 MED ORDER — CEPHALEXIN 500 MG PO CAPS: 500.0000 mg | ORAL_CAPSULE | Freq: Two times a day (BID) | ORAL | Status: DC

## 2013-01-15 NOTE — Progress Notes (Addendum)
[facility] [address] [place] [address] [phone]  Date:  01/15/2013   Name:  [name]   DOB:  Jul 31, 1946   MRN:  [id number]  PCP:  Willow Ora, MD    Chief Complaint: Nausea and Fatigue   History of Present Illness:  [name] is a 67 y.o. very pleasant female patient who presents with the following:  She has been in to see [place] regarding a wound on her left leg a couple of times over the last month. At our last visit about one week ago the wound appeared to be doing well.  She was started on augmentin, and her culture grew staph aureus.    She has felt "extreme fatigue," nausea, and dizziness, for the last 3 days- started after she used her usual laxative.  She uses Engineer, petroleum as a laxative periodically and has done so for some time.  She was quite constipated prior to using the laxative. She is having loose stools- a couple of times a day.  No blood.  She has not vomited.  She has not been able to eat much for the last few days.    On further review the dizziness is not new- she has noted vertigo intermittenlty for years.   No tinnitus.    She also started a grapefruit diet a few days ago and has been eating about 1.5 grapefruits a day.  She has tried this in the past without ill effect.  She started this diet about one week ago.    She has noted some cough "on an off for the last 6 months." She did notice a tender right lymph node in her neck, but this is now better No tooth pain, mild ST off an on.    So far today she had a 1/2 bowl of cereal.    She has a history of constipation "because I have too much bowel" and she has used laxatives as needed for years.    She was treated with augmentin for 10 days from 4/16 to aprox 4/25.    She notes a lot of abdominal gas, but has not noted any definite new urinary symptoms except for frequency/    Patient Active Problem List   Diagnosis Date Noted  . RLS (restless legs syndrome) 04/29/2012   . Abdominal pain 12/27/2010  . DEGENERATIVE JOINT DISEASE 09/23/2010  . CAROTID ARTERY DISEASE 08/15/2010  . CHEST PAIN 08/15/2010  . HEADACHE 12/02/2009  . BACK PAIN 11/22/2009  . CONSTIPATION, CHRONIC 01/29/2008  . NAUSEA 01/28/2008  . DEPRESSION 03/08/2007  . HYPERTENSION 03/08/2007    Past Medical History  Diagnosis Date  . Hypertension   . Constipation     chronic;severe  . CAD (coronary artery disease)     intra and extracranial vascular dx per MRI 4/11, neurology rec strict CVRF control  . Depression   . Psoriasis     sees derm  . EKG abnormalities     changes, stress test neg (false EKG changes)  . Colonic inertia   . Bipolar disorder   . GERD (gastroesophageal reflux disease)     Past Surgical History  Procedure Laterality Date  . Tubal ligation    . Arthroscopy  04/2010    Right knee    History  Substance Use Topics  . Smoking status: Current Every Day Smoker    Types: Cigarettes  . Smokeless tobacco: Never Used  . Alcohol Use: 1.2 oz/week  1 Cans of beer, 1 Glasses of wine per week     Comment: yes on occassion    Family History  Problem Relation Age of Onset  . Breast cancer Mother     metastisis to bones  . Diabetes Son   . Heart disease      grandfather   . Alcohol abuse Brother   . Colon cancer Neg Hx   . Heart disease Maternal Aunt     No Known Allergies  Medication list has been reviewed and updated.  Current Outpatient Prescriptions on File Prior to Visit  Medication Sig Dispense Refill  . aspirin 81 MG tablet Take 81 mg by mouth daily.        . Cimetidine (ACID REDUCER PO) Take by mouth. Takes one by mouth once daily, may take one depending on how bad she feels       . naproxen sodium (ANAPROX) 220 MG tablet Take 220 mg by mouth 2 (two) times daily with a meal.      . rOPINIRole (REQUIP) 2 MG tablet TAKE 1 TABLET BY MOUTH AT BEDTIME  30 tablet  0   No current facility-administered medications on file prior to visit.     Review of Systems:  As per HPI- otherwise negative.   Physical Examination: Filed Vitals:   01/15/13 1329  BP: 159/73  Pulse: 76  Temp: 98.4 F (36.9 C)  Resp: 16   Filed Vitals:   01/15/13 1329  Height: 5' 7.5" (1.715 m)  Weight: 194 lb (87.998 kg)   Body mass index is 29.92 kg/(m^2). Ideal Body Weight: Weight in (lb) to have BMI = 25: 161.7  GEN: WDWN, NAD, Non-toxic, A & O x 3 HEENT: Atraumatic, Normocephalic. Neck supple. No masses, No LAD.  Bilateral TM wnl, oropharynx normal.  PEERL,EOMI.   Ears and Nose: No external deformity. CV: RRR, No M/G/R. No JVD. No thrill. No extra heart sounds. PULM: CTA B, no wheezes, crackles, rhonchi. No retractions. No resp. distress. No accessory muscle use. ABD: S, NT, ND, +BS. No rebound. No HSM. Benign exam EXTR: No c/c/e NEURO Normal gait.  PSYCH: Normally interactive. Conversant. Not depressed or anxious appearing.  Calm demeanor.  Left lower leg; the wound on her medial shin appears to be healing well, no evidence of cellulitis or acute infection.    [facility] reading (PRIMARY) by  Dr. Patsy Lager. CXR: negative.  Elevated right hemi- diaphragm.  Unsure if this is new- please compare to any old films Abdominal series:   Increased gas, no other findings.   ABDOMEN - 2 VIEW  Comparison: CT to 01/2010  Findings: There are gas filled loops of large and small bowel which  are not dilated beyond normal. There is gas of stool in the  rectum. There is chronic elevation of the right hemidiaphragm not  changed from comparison CT. No pathologic calcifications.  Degenerative change of the spine.  IMPRESSION:  1. No evidence of bowel obstruction or intraperitoneal free air.  2. Gas-filled loops of large and small bowel are not  pathologically dilated.  CHEST - 2 VIEW  Comparison: None.  Findings: Normal mediastinum and heart silhouette. There is  chronic bronchitic markings centrally. No focal consolidation. No  pneumothorax.   IMPRESSION:  Chronic bronchitic change. No acute findings.  Clinically significant discrepancy from primary report, if  provided: None   Results for orders placed in visit on 01/15/13  POCT CBC      Result Value Range   WBC 6.0  4.6 - 10.2 K/uL   Lymph, poc 1.8  0.6 - 3.4   POC LYMPH PERCENT 30.4  10 - 50 %L   MID (cbc) 0.7  0 - 0.9   POC MID % 11.6  0 - 12 %M   POC Granulocyte 3.5  2 - 6.9   Granulocyte percent 58.0  37 - 80 %G   RBC 4.79  4.04 - 5.48 M/uL   Hemoglobin 13.6  12.2 - 16.2 g/dL   HCT, POC 16.1  09.6 - 47.9 %   MCV 93.7  80 - 97 fL   MCH, POC 28.4  27 - 31.2 pg   MCHC 30.3 (*) 31.8 - 35.4 g/dL   RDW, POC 04.5     Platelet Count, POC 285  142 - 424 K/uL   MPV 8.1  0 - 99.8 fL  GLUCOSE, POCT (MANUAL RESULT ENTRY)      Result Value Range   POC Glucose 96  70 - 99 mg/dl  POCT RAPID STREP A (OFFICE)      Result Value Range   Rapid Strep A Screen Negative  Negative  POCT URINALYSIS DIPSTICK      Result Value Range   Color, UA yellow     Clarity, UA cloudy     Glucose, UA neg     Bilirubin, UA neg     Ketones, UA neg     Spec Grav, UA <=1.005     Blood, UA trace-lysed     pH, UA 7.0     Protein, UA neg     Urobilinogen, UA 0.2     Nitrite, UA neg     Leukocytes, UA large (3+)    POCT UA - MICROSCOPIC ONLY      Result Value Range   WBC, Ur, HPF, POC 6-10     RBC, urine, microscopic 0-2     Bacteria, U Microscopic trace     Mucus, UA neg     Epithelial cells, urine per micros 1-3     Crystals, Ur, HPF, POC neg     Casts, Ur, LPF, POC neg     Yeast, UA neg      Assessment and Plan: Nausea alone - Plan: Comprehensive metabolic panel, DG Abd 2 Views  Diarrhea  Cough - Plan: DG Chest 2 View  Fever, unspecified - Plan: POCT CBC, POCT rapid strep A, POCT urinalysis dipstick, POCT UA - Microscopic Only, Amylase, Lipase  Weakness - Plan: POCT glucose (manual entry)  UTI (urinary tract infection) - Plan: Urine culture, cephALEXin (KEFLEX) 500 MG  capsule  Xinyi is here today with illness.  Suspect she may have a UTI.  Will treat with keflex while we await her urine culture results. Also check CMP and amylase/ lipase.  Suspect her abdominal discomfort is due to gas and discussed strategies to decrease gas.  Encouraged a probiotic as she recently finished a round of abx.  She will be sure to let us know if any increase in diarrhea symptoms.    Signed Abbe Amsterdam, MD  addnd 01/16/13- called to check on her.  CMP and amylase/ lipase are ok.  Discussed with her husband.  Urine culture pending but will let her know when I get these results.  He relays that she is doing ok today

## 2013-01-15 NOTE — Telephone Encounter (Signed)
I spoke to her, and she is asking for the wound care referral. She also states she is not feeling well at all. I have advised her Dr Patsy Lager is here today, and she should come in now. Wound care referral made for her, but I am concerns about her complaints, she states she feels terrible/ fatigued/ sounds fatigued on the phone. To you FYI

## 2013-01-15 NOTE — Patient Instructions (Addendum)
We are going to treat you for a UTI with keflex.  Be sure to drink plenty of fluids, and take a probiotic (OTC) to support your good GI bacteria. You might also try some gas- x over the counter for your gas.  If you are not feeling better in the next few days please let me know- Sooner if worse.   If you have a fever or have any other concerns please let me know right away.

## 2013-01-16 LAB — COMPREHENSIVE METABOLIC PANEL
AST: 23 U/L (ref 0–37)
Albumin: 3.9 g/dL (ref 3.5–5.2)
BUN: 9 mg/dL (ref 6–23)
CO2: 34 mEq/L — ABNORMAL HIGH (ref 19–32)
Calcium: 8.9 mg/dL (ref 8.4–10.5)
Chloride: 101 mEq/L (ref 96–112)
Creat: 0.76 mg/dL (ref 0.50–1.10)
Potassium: 4.5 mEq/L (ref 3.5–5.3)

## 2013-01-16 LAB — AMYLASE: Amylase: 38 U/L (ref 0–105)

## 2013-01-16 LAB — LIPASE: Lipase: 25 U/L (ref 0–75)

## 2013-01-17 ENCOUNTER — Telehealth: Payer: Self-pay

## 2013-01-17 DIAGNOSIS — R110 Nausea: Secondary | ICD-10-CM

## 2013-01-17 LAB — URINE CULTURE: Colony Count: NO GROWTH

## 2013-01-17 MED ORDER — ONDANSETRON HCL 8 MG PO TABS: 8.0000 mg | ORAL_TABLET | Freq: Three times a day (TID) | ORAL | Status: DC | PRN

## 2013-01-17 NOTE — Telephone Encounter (Signed)
Called to discuss with her.  She is not having any pain, but states she has felt nauseated for a week. Let her know that her urine culture is negative, so she can stop the abx which might help.  She is NOT having any pain, just nausea. She did end up moving her urology appt to next week which is fine.    Will rx zofran.  She is to let me know if not feeling better with the addition of this medication.   Normal Qtc on EKG in her chart from 2011.

## 2013-01-17 NOTE — Telephone Encounter (Signed)
Dr. Patsy Lager, Pt states that she is having extreme nausea and would like you to call something in for her. She uses the [facility] on [address]. In [place] Pt's # [phone]

## 2013-01-18 NOTE — Telephone Encounter (Signed)
Called and LMOM- I hope that she is doing better today, please give [place] a call if not.

## 2013-01-22 ENCOUNTER — Encounter: Payer: Self-pay | Admitting: Family Medicine

## 2013-01-29 NOTE — Telephone Encounter (Signed)
Yes, thank you.

## 2013-01-29 NOTE — Telephone Encounter (Signed)
AMY, HAS THIS BEEN COMPLETED OR SHOULD I LEAVE IT IN MY OPEN ENCOUNTERS?   [place], MARY

## 2013-01-30 ENCOUNTER — Encounter (HOSPITAL_BASED_OUTPATIENT_CLINIC_OR_DEPARTMENT_OTHER): Payer: Medicare Other

## 2013-02-04 ENCOUNTER — Other Ambulatory Visit: Payer: Self-pay | Admitting: Family Medicine

## 2013-02-05 ENCOUNTER — Telehealth: Payer: Self-pay | Admitting: Internal Medicine

## 2013-02-05 NOTE — Telephone Encounter (Signed)
Spoke with patient and she states she saw a worm swimming in the toilet. She denies travel out of the country. States she was in [place] visiting and is going away next month. Wants to be seen prior to this. Offered OV with extender. Scheduled on 02/07/13 at 3:00 PM with Doug Sou, PA.

## 2013-02-07 ENCOUNTER — Ambulatory Visit: Payer: Medicare Other | Admitting: Gastroenterology

## 2013-02-07 ENCOUNTER — Ambulatory Visit: Payer: Medicare Other | Admitting: Internal Medicine

## 2013-02-12 DIAGNOSIS — F330 Major depressive disorder, recurrent, mild: Secondary | ICD-10-CM | POA: Diagnosis not present

## 2013-02-17 DIAGNOSIS — IMO0002 Reserved for concepts with insufficient information to code with codable children: Secondary | ICD-10-CM | POA: Diagnosis not present

## 2013-03-07 ENCOUNTER — Encounter: Payer: Self-pay | Admitting: *Deleted

## 2013-04-15 ENCOUNTER — Encounter: Payer: Self-pay | Admitting: Internal Medicine

## 2013-04-15 ENCOUNTER — Ambulatory Visit (INDEPENDENT_AMBULATORY_CARE_PROVIDER_SITE_OTHER): Payer: Medicare Other | Admitting: Internal Medicine

## 2013-04-15 ENCOUNTER — Ambulatory Visit (INDEPENDENT_AMBULATORY_CARE_PROVIDER_SITE_OTHER)
Admission: RE | Admit: 2013-04-15 | Discharge: 2013-04-15 | Disposition: A | Discharge status: Home / Self Care | Payer: Medicare Other | Source: Ambulatory Visit | Attending: Internal Medicine | Admitting: Internal Medicine

## 2013-04-15 VITALS — BP 142/80 | HR 96 | Ht 69.0 in | Wt 188.2 lb

## 2013-04-15 DIAGNOSIS — K5900 Constipation, unspecified: Secondary | ICD-10-CM | POA: Diagnosis not present

## 2013-04-15 DIAGNOSIS — K560 Paralytic ileus: Secondary | ICD-10-CM | POA: Diagnosis not present

## 2013-04-15 MED ORDER — RIFAXIMIN 550 MG PO TABS: 550.0000 mg | ORAL_TABLET | Freq: Two times a day (BID) | ORAL | Status: DC

## 2013-04-15 NOTE — Patient Instructions (Addendum)
We have given you a sample of Suprep to take for a bowel purge. Please follow instructions given on the side of the box.  We have sent the following medications to your pharmacy for you to pick up at your convenience: Xifaxan 550 mg twice daily x 7 days  You have been scheduled for a KUB before leaving today. Please go to radiology on the basement floor of [facility] for this test. No preparation is necessary.  CC: Dr Willow Ora, Dr Abbey Chatters

## 2013-04-15 NOTE — Progress Notes (Signed)
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 She has a severely redundant tortuous colon and chronic constipation. She underwent a full evaluation in February 2011. She has been on a bowel regimen consisting of MiraLax, Mag Oxide and Dulcolox.. She had an incomplete colonoscopy in May 2009. A barium enema at that time showed that barium could not be refluxed beyond the sigmoid colon. A virtual colonoscopy in February 2011 showed a severely tortuous colon. She continues to have problems with bloating, gas, distention and continuous abdominal discomfort. She saw Dr Aleen Campi for consultation for partial colectomy on 10/01/2009 here in [place] and she also saw a Careers adviser  in [place] for consideration of partial colectomy. but she has not been able to obtain a recommendation for partial colectomy except for one surgeon in [place] who did not take insurance.. Her sister who is our patient also has a tortuous redundant colon causing chronic constipation.   Past Medical History  Diagnosis Date  . Hypertension   . Constipation     chronic;severe  . CAD (coronary artery disease)     intra and extracranial vascular dx per MRI 4/11, neurology rec strict CVRF control  . Depression   . Psoriasis     sees derm  . EKG abnormalities     changes, stress test neg (false EKG changes)  . Colonic inertia   . Bipolar disorder   . GERD (gastroesophageal reflux disease)   . Gastritis   . Duodenitis    Past Surgical History  Procedure Laterality Date  . Tubal ligation    . Arthroscopy  04/2010    Right knee    reports that she has been smoking Cigarettes.  She has been smoking about 0.00 packs per day. She has never used smokeless tobacco. She reports that she drinks about 1.2 ounces of alcohol per week. She reports that she does not use illicit drugs. family history includes Alcohol abuse in her  brother; Breast cancer in her mother; Diabetes in her son; and Heart disease in her maternal aunt and unspecified family member.  There is no history of Colon cancer. No Known Allergies      Review of Systems: Smoker, denies nausea vomiting or heartburn  The remainder of the 10 point ROS is negative except as outlined in H&P   Physical Exam: General appearance  Well developed, in no distress. Eyes- non icteric. HEENT nontraumatic, normocephalic. Mouth no lesions, tongue papillated, no cheilosis. Neck supple without adenopathy, thyroid not enlarged, no carotid bruits, no JVD. Lungs Clear to auscultation bilaterally. Cor normal S1, normal S2, regular rhythm, no murmur,  quiet precordium. Abdomen: Mildly distended and tympanitic. Diffusely tender. Hyperactive bowel sounds with abnormal rushes. No palpable mass or ascites. Rectal: Small amount of soft Hemoccult negative stool. Anal canal is tender. Extremities no pedal edema. Skin no lesions. Neurological alert and oriented x 3. Psychological normal mood and affect.  Assessment and Plan:  Problem #9 67 year old white female with a torturous and redundant colon and colonic inertia who is having continuous abdominal distention, bloating and cramps. We did obtain a KUB today to assess her for constipation. It showed an increase in small and large bowl gas but no obstruction or fecal impaction We will give her a colonoscopy prep to clean her bowels and put her on Xifaxin 550 mg twice a day for bacterial overgrowth x 7 days.. She was advised to continue on  MiraLax and magnesium oxide.She is still interested in surgical opinion ans I think she would likely have to have another imaging study before considering surgery and surgical consultation.CT scan may be appropriate to r/o  Other causes of abd distention.   04/15/2013 [name]

## 2013-04-30 DIAGNOSIS — F330 Major depressive disorder, recurrent, mild: Secondary | ICD-10-CM | POA: Diagnosis not present

## 2013-06-16 ENCOUNTER — Encounter: Payer: Self-pay | Admitting: Internal Medicine

## 2013-06-16 ENCOUNTER — Ambulatory Visit (INDEPENDENT_AMBULATORY_CARE_PROVIDER_SITE_OTHER): Payer: Medicare Other | Admitting: Internal Medicine

## 2013-06-16 VITALS — BP 183/93 | HR 80 | Temp 98.9°F | Ht 67.8 in | Wt 191.8 lb

## 2013-06-16 DIAGNOSIS — Z0000 Encounter for general adult medical examination without abnormal findings: Secondary | ICD-10-CM

## 2013-06-16 DIAGNOSIS — Z13820 Encounter for screening for osteoporosis: Secondary | ICD-10-CM

## 2013-06-16 DIAGNOSIS — I10 Essential (primary) hypertension: Secondary | ICD-10-CM | POA: Diagnosis not present

## 2013-06-16 DIAGNOSIS — F329 Major depressive disorder, single episode, unspecified: Secondary | ICD-10-CM

## 2013-06-16 DIAGNOSIS — Z1239 Encounter for other screening for malignant neoplasm of breast: Secondary | ICD-10-CM

## 2013-06-16 DIAGNOSIS — I6529 Occlusion and stenosis of unspecified carotid artery: Secondary | ICD-10-CM

## 2013-06-16 DIAGNOSIS — R079 Chest pain, unspecified: Secondary | ICD-10-CM | POA: Diagnosis not present

## 2013-06-16 DIAGNOSIS — F3289 Other specified depressive episodes: Secondary | ICD-10-CM

## 2013-06-16 DIAGNOSIS — R42 Dizziness and giddiness: Secondary | ICD-10-CM

## 2013-06-16 MED ORDER — AMLODIPINE BESYLATE 5 MG PO TABS: 5.0000 mg | ORAL_TABLET | Freq: Every day | ORAL | Status: DC

## 2013-06-16 NOTE — Assessment & Plan Note (Signed)
Managed elsewhere 

## 2013-06-16 NOTE — Progress Notes (Signed)
Subjective:    Patient ID: [name], female    DOB: 04/07/1946, 67 y.o.   MRN: [id number]  HPI  Here for Medicare AWV: 1. Risk factors based on Past M, S, F history: reviewed  2. Physical Activities: very sedentary 3. Depression/mood:  sees  Psychiatry, sx ok, not 100% controlled, no suicidal 4. Hearing: hearing very good  5. ADL's: totally independent  6. Fall Risk: no recent falls, see instructions   7. Home Safety: does feel safe at home  8. Height, weight, &visual acuity: see VS, had surgery, still has issues, sees ophtalmology 9. Counseling:  see a/p  10. Labs ordered based on risk factors: yes  11. Referral Coordination, if needed  12.   Care Plan, see a/p  13.  Cognitive Assessment-- motor skill , memory and cognition seem appropiate   We also discussed the following: --1 year history of gradual onset dizziness, she feels that way every day, symptoms last  several hours, occasionally worse when she bends over but not worse when she turned her head. No associated slurred speech, diplopia or motor deficits. --Decreased memory? --She has a history of depression, feels unmotivated. --She has a vaginal discharge, will be referred to gynecology --DJD, complains of pain in the knees but no swelling. --Also complained of chest pain, it happened a week ago, it encompasses the entire anterior chest and some of the epigastrium bilaterally, no radiation, at rest, not associated palpitations or diaphoresis but she felt slightly nauseous and short of breath. Denies any recent air plane trip, no leg swelling or pain (other than that knee pain.  Past Medical History  Diagnosis Date  . Hypertension   . Constipation     chronic;severe  . CAD (coronary artery disease)     intra and extracranial vascular dx per MRI 4/11, neurology rec strict CVRF control  . Depression   . Psoriasis     sees derm  . EKG abnormalities     changes, stress test neg (false EKG changes)  . Colonic inertia    . Bipolar disorder   . GERD (gastroesophageal reflux disease)   . Gastritis   . Duodenitis    Past Surgical History  Procedure Laterality Date  . Tubal ligation    . Arthroscopy  04/2010    Right knee   History   Social History  . Marital Status: Married    Spouse Name: N/A    Number of Children: 1  . Years of Education: N/A   Occupational History  . retired    Social History Main Topics  . Smoking status: Current Every Day Smoker -- 1.00 packs/day    Types: Cigarettes  . Smokeless tobacco: Never Used  . Alcohol Use: 1.2 oz/week    1 Cans of beer, 1 Glasses of wine per week     Comment: yes on occassion  . Drug Use: No  . Sexual Activity: Not on file   Other Topics Concern  . Not on file   Social History Narrative   Brother in law Mr Cranston Neighbor (one of my patients)   Lives w/ husband       Family History  Problem Relation Age of Onset  . Breast cancer Mother     metastisis to bones  . Diabetes Son   . Heart disease      grandfather   . Alcohol abuse Brother   . Colon cancer Neg Hx   . Heart disease Maternal Aunt  Review of Systems See HPI Denies suicidal ideas    Objective:   Physical Exam BP 183/93  Pulse 80  Temp(Src) 98.9 F (37.2 C)  Ht 5' 7.8" (1.722 m)  Wt 191 lb 12.8 oz (87 kg)  BMI 29.34 kg/m2  SpO2 98% General -- alert, well-developed, NAD.  Neck --no thyromegaly , normal carotid pulse  HEENT-- Not pale.   Lungs -- normal respiratory effort, no intercostal retractions, no accessory muscle use, and normal breath sounds.  Heart-- normal rate, regular rhythm, no murmur.  Abdomen-- Not distended, good bowel sounds,soft, non-tender. Extremities-- no pretibial edema bilaterally ; Knees symmetric with some deformities consistent with DJD, no effusion or redness. Neurologic--  alert & oriented to self, time, place and circumstances  Speech normal, gait normal, strength normal in all extremities.  DTRs symmetric,EOMI, PERLA    Psych-- Cognition and judgment appear intact. Cooperative with normal attention span and concentration. No anxious appearing , no depressed appearing.      Assessment & Plan:

## 2013-06-16 NOTE — Assessment & Plan Note (Addendum)
Td-, pneumonia ,  flu shot---->  explained benefits of shots, declined  shingles shot 2011  Cscope--- per GI   had a DEXA years ago, results not available ,ordering one  Has not seen gyn in a while, refer back to her gyn Dr Ernestina Penna, She did report some vaginal discharge. Encouraged her to keep appointment. Ordering a MMG (Last?)  diet and exercise discussed

## 2013-06-16 NOTE — Patient Instructions (Addendum)
Please come back fasting: FLP, CMP,CBC, TSH ---- hypertension  Please get your x-ray at the other [facility]  office located at: [address] [place], across from [facility].  Please go to the basement, this is a walk-in facility, they are open from 8:30 to 5:30 PM. Phone number [phone].  Start amlodipine 5 mg one tablet every morning to help hypertension  If you have another severe episode of chest pain, go to the ER  Next visit in one month  Fall Prevention and Home Safety Falls cause injuries and can affect all age groups. It is possible to use preventive measures to significantly decrease the likelihood of falls. There are many simple measures which can make your home safer and prevent falls. OUTDOORS  Repair cracks and edges of walkways and driveways.  Remove high doorway thresholds.  Trim shrubbery on the main path into your home.  Have good outside lighting.  Clear walkways of tools, rocks, debris, and clutter.  Check that handrails are not broken and are securely fastened. Both sides of steps should have handrails.  Have leaves, snow, and ice cleared regularly.  Use sand or salt on walkways during winter months.  In the garage, clean up grease or oil spills. BATHROOM  Install night lights.  Install grab bars by the toilet and in the tub and shower.  Use non-skid mats or decals in the tub or shower.  Place a plastic non-slip stool in the shower to sit on, if needed.  Keep floors dry and clean up all water on the floor immediately.  Remove soap buildup in the tub or shower on a regular basis.  Secure bath mats with non-slip, double-sided rug tape.  Remove throw rugs and tripping hazards from the floors. BEDROOMS  Install night lights.  Make sure a bedside light is easy to reach.  Do not use oversized bedding.  Keep a telephone by your bedside.  Have a firm chair with side arms to use for getting dressed.  Remove throw rugs and tripping  hazards from the floor. KITCHEN  Keep handles on pots and pans turned toward the center of the stove. Use back burners when possible.  Clean up spills quickly and allow time for drying.  Avoid walking on wet floors.  Avoid hot utensils and knives.  Position shelves so they are not too high or low.  Place commonly used objects within easy reach.  If necessary, use a sturdy step stool with a grab bar when reaching.  Keep electrical cables out of the way.  Do not use floor polish or wax that makes floors slippery. If you must use wax, use non-skid floor wax.  Remove throw rugs and tripping hazards from the floor. STAIRWAYS  Never leave objects on stairs.  Place handrails on both sides of stairways and use them. Fix any loose handrails. Make sure handrails on both sides of the stairways are as long as the stairs.  Check carpeting to make sure it is firmly attached along stairs. Make repairs to worn or loose carpet promptly.  Avoid placing throw rugs at the top or bottom of stairways, or properly secure the rug with carpet tape to prevent slippage. Get rid of throw rugs, if possible.  Have an electrician put in a light switch at the top and bottom of the stairs. OTHER FALL PREVENTION TIPS  Wear low-heel or rubber-soled shoes that are supportive and fit well. Wear closed toe shoes.  When using a stepladder, make sure it is  fully opened and both spreaders are firmly locked. Do not climb a closed stepladder.  Add color or contrast paint or tape to grab bars and handrails in your home. Place contrasting color strips on first and last steps.  Learn and use mobility aids as needed. Install an electrical emergency response system.  Turn on lights to avoid dark areas. Replace light bulbs that burn out immediately. Get light switches that glow.  Arrange furniture to create clear pathways. Keep furniture in the same place.  Firmly attach carpet with non-skid or double-sided  tape.  Eliminate uneven floor surfaces.  Select a carpet pattern that does not visually hide the edge of steps.  Be aware of all pets. OTHER HOME SAFETY TIPS  Set the water temperature for 120 F (48.8 C).  Keep emergency numbers on or near the telephone.  Keep smoke detectors on every level of the home and near sleeping areas. Document Released: 08/18/2002 Document Revised: 02/27/2012 Document Reviewed: 11/17/2011 [facility] Patient Information 2014 [place], [place].

## 2013-06-16 NOTE — Assessment & Plan Note (Addendum)
BP elevated today and elevated also few months ago at the urgent care Start amlodipine Reassess in one month

## 2013-06-16 NOTE — Assessment & Plan Note (Signed)
One-year history of dizziness, neuro exam neg,history of carotid disease, we're checking the ultrasound. Plan:  MRI of the brain Discussed possible  neurology or ENT referral

## 2013-06-16 NOTE — Assessment & Plan Note (Addendum)
One of her complaints today is CP: EKG --No acute, poor R wave progression Complained of chest pain in 2011, declined to proceed with a stress test at that time. Plan: Chest x-ray Stress test

## 2013-06-16 NOTE — Assessment & Plan Note (Signed)
Neck MRA 2011: 1. Severely decreased caliber of the left vertebral artery at its  origin is suspicious of a high-grade stenosis.  2. Moderate decreased caliber of the right vertebral artery origin  is suggestive of an approximately 50% stenosis .  3. Linear hypointensity in the proximal left subclavian artery  probably represents a kink. However correlation clinically with  blood pressures in both arms is suggested.  She saw neurology at that time, they recommend medical management. Plan: Carotid ultrasound

## 2013-06-24 ENCOUNTER — Inpatient Hospital Stay: Admission: RE | Admit: 2013-06-24 | Payer: Medicare Other | Source: Ambulatory Visit

## 2013-06-30 ENCOUNTER — Telehealth: Payer: Self-pay | Admitting: Internal Medicine

## 2013-06-30 DIAGNOSIS — I10 Essential (primary) hypertension: Secondary | ICD-10-CM

## 2013-06-30 NOTE — Telephone Encounter (Signed)
Patient has an appointment at [facility] on 08/02/13 and wants to have her CPE labs done while she is there. Patient had her CPE on 06/16/13 but does not know why she was never sent to the lab for her blood work. Please advise on orders.

## 2013-07-01 ENCOUNTER — Telehealth: Payer: Self-pay | Admitting: Internal Medicine

## 2013-07-01 DIAGNOSIS — E2839 Other primary ovarian failure: Secondary | ICD-10-CM

## 2013-07-01 NOTE — Telephone Encounter (Signed)
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

## 2013-07-01 NOTE — Telephone Encounter (Signed)
thx

## 2013-07-01 NOTE — Telephone Encounter (Signed)
Salia at [facility] is calling in regards to the patient's bone density order. She states that they can not use the diagnosis given on a screening. Also Mauri Reading states that the patient has Medicare and they will only cover certain diagnosis's. She is recommending we change it to Estrogen deficiency because Medicare will cover this.

## 2013-07-01 NOTE — Telephone Encounter (Signed)
Yes , new dx is "estrogen deficiency"

## 2013-07-02 ENCOUNTER — Inpatient Hospital Stay: Admission: RE | Admit: 2013-07-02 | Payer: Medicare Other | Source: Ambulatory Visit

## 2013-07-02 ENCOUNTER — Encounter (HOSPITAL_COMMUNITY): Payer: Medicare Other

## 2013-07-02 DIAGNOSIS — R0989 Other specified symptoms and signs involving the circulatory and respiratory systems: Secondary | ICD-10-CM

## 2013-07-03 NOTE — Telephone Encounter (Signed)
Spoke with patient to clarify and go over her discharge instructions that was given at her last office visit 06/16/13.Pt was not aware of discharge instructions at her last visit.

## 2013-07-04 ENCOUNTER — Encounter: Payer: Self-pay | Admitting: Cardiology

## 2013-07-17 ENCOUNTER — Other Ambulatory Visit: Payer: Self-pay

## 2013-07-18 ENCOUNTER — Ambulatory Visit: Payer: Medicare Other | Admitting: Internal Medicine

## 2013-07-31 ENCOUNTER — Ambulatory Visit (INDEPENDENT_AMBULATORY_CARE_PROVIDER_SITE_OTHER): Payer: Medicare Other | Admitting: Family Medicine

## 2013-07-31 VITALS — BP 152/88 | HR 74 | Temp 99.1°F | Resp 16 | Ht 67.75 in | Wt 195.4 lb

## 2013-07-31 DIAGNOSIS — R300 Dysuria: Secondary | ICD-10-CM | POA: Diagnosis not present

## 2013-07-31 DIAGNOSIS — R110 Nausea: Secondary | ICD-10-CM

## 2013-07-31 DIAGNOSIS — F330 Major depressive disorder, recurrent, mild: Secondary | ICD-10-CM | POA: Diagnosis not present

## 2013-07-31 DIAGNOSIS — R509 Fever, unspecified: Secondary | ICD-10-CM

## 2013-07-31 LAB — POCT UA - MICROSCOPIC ONLY
Crystals, Ur, HPF, POC: NEGATIVE
Yeast, UA: NEGATIVE

## 2013-07-31 LAB — POCT CBC
Granulocyte percent: 70.2 %G (ref 37–80)
MCH, POC: 29 pg (ref 27–31.2)
MCV: 95.8 fL (ref 80–97)
MID (cbc): 0.5 (ref 0–0.9)
MPV: 7.6 fL (ref 0–99.8)
POC Granulocyte: 6.7 (ref 2–6.9)
POC LYMPH PERCENT: 24.3 %L (ref 10–50)
POC MID %: 5.5 %M (ref 0–12)
Platelet Count, POC: 214 10*3/uL (ref 142–424)
RBC: 4.07 M/uL (ref 4.04–5.48)
RDW, POC: 14 %
WBC: 9.5 10*3/uL (ref 4.6–10.2)

## 2013-07-31 LAB — POCT URINALYSIS DIPSTICK
Nitrite, UA: NEGATIVE
Protein, UA: 30
Spec Grav, UA: 1.01
Urobilinogen, UA: 0.2
pH, UA: 7

## 2013-07-31 MED ORDER — CIPROFLOXACIN HCL 500 MG PO TABS: 500.0000 mg | ORAL_TABLET | Freq: Two times a day (BID) | ORAL | Status: DC

## 2013-07-31 MED ORDER — ONDANSETRON 4 MG PO TBDP
4.0000 mg | ORAL_TABLET | Freq: Once | ORAL | Status: AC
  Administered 2013-07-31: 4 mg via ORAL

## 2013-07-31 MED ORDER — PHENAZOPYRIDINE HCL 100 MG PO TABS: 100.0000 mg | ORAL_TABLET | Freq: Three times a day (TID) | ORAL | Status: DC | PRN

## 2013-07-31 MED ORDER — ONDANSETRON 8 MG PO TBDP: 8.0000 mg | ORAL_TABLET | Freq: Three times a day (TID) | ORAL | Status: DC | PRN

## 2013-07-31 NOTE — Patient Instructions (Signed)
Urinary Tract Infection  Urinary tract infections (UTIs) can develop anywhere along your urinary tract. Your urinary tract is your body's drainage system for removing wastes and extra water. Your urinary tract includes two kidneys, two ureters, a bladder, and a urethra. Your kidneys are a pair of bean-shaped organs. Each kidney is about the size of your fist. They are located below your ribs, one on each side of your spine.  CAUSES  Infections are caused by microbes, which are microscopic organisms, including fungi, viruses, and bacteria. These organisms are so small that they can only be seen through a microscope. Bacteria are the microbes that most commonly cause UTIs.  SYMPTOMS   Symptoms of UTIs may vary by age and gender of the patient and by the location of the infection. Symptoms in young women typically include a frequent and intense urge to urinate and a painful, burning feeling in the bladder or urethra during urination. Older women and men are more likely to be tired, shaky, and weak and have muscle aches and abdominal pain. A fever may mean the infection is in your kidneys. Other symptoms of a kidney infection include pain in your back or sides below the ribs, nausea, and vomiting.  DIAGNOSIS  To diagnose a UTI, your caregiver will ask you about your symptoms. Your caregiver also will ask to provide a urine sample. The urine sample will be tested for bacteria and white blood cells. White blood cells are made by your body to help fight infection.  TREATMENT   Typically, UTIs can be treated with medication. Because most UTIs are caused by a bacterial infection, they usually can be treated with the use of antibiotics. The choice of antibiotic and length of treatment depend on your symptoms and the type of bacteria causing your infection.  HOME CARE INSTRUCTIONS   If you were prescribed antibiotics, take them exactly as your caregiver instructs you. Finish the medication even if you feel better after you  have only taken some of the medication.   Drink enough water and fluids to keep your urine clear or pale yellow.   Avoid caffeine, tea, and carbonated beverages. They tend to irritate your bladder.   Empty your bladder often. Avoid holding urine for long periods of time.   Empty your bladder before and after sexual intercourse.   After a bowel movement, women should cleanse from front to back. Use each tissue only once.  SEEK MEDICAL CARE IF:    You have back pain.   You develop a fever.   Your symptoms do not begin to resolve within 3 days.  SEEK IMMEDIATE MEDICAL CARE IF:    You have severe back pain or lower abdominal pain.   You develop chills.   You have nausea or vomiting.   You have continued burning or discomfort with urination.  MAKE SURE YOU:    Understand these instructions.   Will watch your condition.   Will get help right away if you are not doing well or get worse.  Document Released: 06/07/2005 Document Revised: 02/27/2012 Document Reviewed: 10/06/2011  [employer] Patient Information 2014 [employer].

## 2013-07-31 NOTE — Progress Notes (Signed)
Subjective:    Patient ID: [name], female    DOB: [birth year], 67 y.o.   MRN: [id number]  This chart was scribed for Ethelda Chick, MD by Blanchard Kelch, ED Scribe. The patient was seen in room 14. Patient's care was started at 5:31 PM.   HPI  [name] is a 67 y.o. female who presents to office complaining of constant dysuria that began a week ago. She has associated frequency, lower abdominal pain, headache, dizziness, and nausea. She vomited three days ago but has just been nauseous since. She also reports she had a fever last night with a max of 102. She has been taking Aleve PM with moderate relief. She used to get UTIs frequently as a child but this is the first she has felt these symptoms in years. She just got back from [place] to be with her nephew who has stage four cancer and developed an upper respiratory infection three weeks ago, but that subsided right before the urinary symptoms began. She did not have chills and diaphoresis with the respiratory symptoms.   She denies sore throat, rhinorrhea, cough, ear pain, vaginal irritation, hematuria that she noticed or above baseline back pain.  She has been having intermittent chest pain but has an appointment with Dr. Drue Novel on 11/22 for it. She denies any increase in frequency or severity since being sick. She has noticed brown vaginal discharge that began awhile ago but has an appointment with her OB-GYN 11/25 to get it checked out. Her PCP is Dr. Drue Novel.  Past Medical History  Diagnosis Date   Hypertension    Constipation     chronic;severe   CAD (coronary artery disease)     intra and extracranial vascular dx per MRI 4/11, neurology rec strict CVRF control   Depression    Psoriasis     sees derm   EKG abnormalities     changes, stress test neg (false EKG changes)   Colonic inertia    Bipolar disorder    GERD (gastroesophageal reflux disease)    Gastritis    Duodenitis    Past Surgical History  Procedure  Laterality Date   Tubal ligation     Arthroscopy  04/2010    Right knee   Family History  Problem Relation Age of Onset   Breast cancer Mother     metastisis to bones   Diabetes Son    Heart disease      grandfather    Alcohol abuse Brother    Colon cancer Neg Hx    Heart disease Maternal Aunt    No Known Allergies Current Outpatient Prescriptions on File Prior to Visit  Medication Sig Dispense Refill   amLODipine (NORVASC) 5 MG tablet Take 1 tablet (5 mg total) by mouth daily.  30 tablet  3   bisacodyl (DULCOLAX) 5 MG EC tablet Take 5 mg by mouth daily as needed for constipation.       Cimetidine (ACID REDUCER PO) Take by mouth. Takes one by mouth once daily, may take one depending on how bad she feels        clonazePAM (KLONOPIN) 2 MG tablet 1 mg in the a.m and afternoon, 2 mg at night       DULoxetine (CYMBALTA) 30 MG capsule Take 30 mg by mouth daily.       magnesium gluconate (MAGONATE) 500 MG tablet Take 500 mg by mouth 2 (two) times daily.       naproxen sodium (ANAPROX) 220  MG tablet Take 220 mg by mouth 2 (two) times daily with a meal.       polyethylene glycol (MIRALAX / GLYCOLAX) packet Take 17 g by mouth daily.       rOPINIRole (REQUIP) 2 MG tablet TAKE 1 TABLET BY MOUTH AT BEDTIME  30 tablet  0   No current facility-administered medications on file prior to visit.     Review of Systems  Constitutional: Positive for fever. Negative for chills and diaphoresis.  HENT: Negative for congestion, drooling, ear pain, rhinorrhea and sore throat.   Eyes: Negative for discharge.  Respiratory: Negative for cough.   Cardiovascular: Negative for leg swelling.  Gastrointestinal: Positive for nausea, vomiting and abdominal pain.  Endocrine: Negative for polyuria.  Genitourinary: Positive for dysuria, frequency and vaginal discharge. Negative for hematuria, flank pain and vaginal pain.  Musculoskeletal: Negative for gait problem.  Skin: Negative for rash.   Allergic/Immunologic: Negative for immunocompromised state.  Neurological: Positive for dizziness. Negative for speech difficulty.  Hematological: Negative for adenopathy.  Psychiatric/Behavioral: Negative for confusion.       Objective:   Physical Exam  Nursing note and vitals reviewed. Constitutional: She is oriented to person, place, and time. She appears well-developed and well-nourished.  HENT:  Head: Normocephalic and atraumatic.  Right Ear: Tympanic membrane, external ear and ear canal normal.  Left Ear: Tympanic membrane, external ear and ear canal normal.  Nose: Nose normal.  Mouth/Throat: Uvula is midline, oropharynx is clear and moist and mucous membranes are normal.  Neck: Normal range of motion. Neck supple. No thyromegaly present.  Cardiovascular: Normal rate and regular rhythm.   No murmur heard. Pulmonary/Chest: Effort normal. No respiratory distress. She has no wheezes. She has no rales.  Abdominal: Soft. She exhibits no distension. There is no tenderness.  Lymphadenopathy:    She has no cervical adenopathy.  Neurological: She is alert and oriented to person, place, and time.  Skin: Skin is warm.   Results for orders placed in visit on 07/31/13  POCT CBC      Result Value Range   WBC 9.5  4.6 - 10.2 K/uL   Lymph, poc 2.3  0.6 - 3.4   POC LYMPH PERCENT 24.3  10 - 50 %L   MID (cbc) 0.5  0 - 0.9   POC MID % 5.5  0 - 12 %M   POC Granulocyte 6.7  2 - 6.9   Granulocyte percent 70.2  37 - 80 %G   RBC 4.07  4.04 - 5.48 M/uL   Hemoglobin 11.8 (*) 12.2 - 16.2 g/dL   HCT, POC 62.1  30.8 - 47.9 %   MCV 95.8  80 - 97 fL   MCH, POC 29.0  27 - 31.2 pg   MCHC 30.3 (*) 31.8 - 35.4 g/dL   RDW, POC 65.7     Platelet Count, POC 214  142 - 424 K/uL   MPV 7.6  0 - 99.8 fL  POCT URINALYSIS DIPSTICK      Result Value Range   Color, UA yellow     Clarity, UA cloudy     Glucose, UA neg     Bilirubin, UA neg     Ketones, UA neg     Spec Grav, UA 1.010     Blood, UA  moderate     pH, UA 7.0     Protein, UA 30     Urobilinogen, UA 0.2     Nitrite, UA neg  Leukocytes, UA large (3+)    POCT UA - MICROSCOPIC ONLY      Result Value Range   WBC, Ur, HPF, POC TNTC     RBC, urine, microscopic 10-15     Bacteria, U Microscopic 2+     Mucus, UA neg     Epithelial cells, urine per micros 0-1     Crystals, Ur, HPF, POC neg     Casts, Ur, LPF, POC neg     Yeast, UA neg        Assessment & Plan:  Dysuria - Plan: POCT CBC, POCT urinalysis dipstick, POCT UA - Microscopic Only, Urine culture, Comprehensive metabolic panel  Fever, unspecified - Plan: POCT CBC, POCT urinalysis dipstick, POCT UA - Microscopic Only, Urine culture, Comprehensive metabolic panel, ondansetron (ZOFRAN-ODT) disintegrating tablet 4 mg  1. Dysuria:  New. Send urine culture; treat with Cipro, pyridium.  RTC for fever, severe back pain, vomiting.  Hydrate with fluids aggressively over next several days. 2. Fever:  New. Associated with dysuria and no other symptoms; treat UTI and follow fever.  WBC count normal.  3. Nausea;  New.  Rx for Zofran provided.  Meds ordered this encounter  Medications   DISCONTD: ciprofloxacin (CIPRO) 500 MG tablet    Sig: Take 1 tablet (500 mg total) by mouth 2 (two) times daily.    Dispense:  14 tablet    Refill:  0   DISCONTD: phenazopyridine (PYRIDIUM) 100 MG tablet    Sig: Take 1-2 tablets (100-200 mg total) by mouth 3 (three) times daily as needed for pain.    Dispense:  12 tablet    Refill:  0   DISCONTD: ondansetron (ZOFRAN-ODT) 8 MG disintegrating tablet    Sig: Take 1 tablet (8 mg total) by mouth every 8 (eight) hours as needed for nausea.    Dispense:  21 tablet    Refill:  0   ondansetron (ZOFRAN-ODT) disintegrating tablet 4 mg    Sig:    I personally performed the services described in this documentation, which was scribed in my presence.  The recorded information has been reviewed and is accurate.  Nilda Simmer, M.D.  [facility] [address] [place], [place]  [address] [phone] phone [phone] fax

## 2013-08-01 ENCOUNTER — Inpatient Hospital Stay: Admission: RE | Admit: 2013-08-01 | Payer: Medicare Other | Source: Ambulatory Visit

## 2013-08-01 LAB — COMPREHENSIVE METABOLIC PANEL
ALT: 13 U/L (ref 0–35)
AST: 13 U/L (ref 0–37)
Albumin: 3.7 g/dL (ref 3.5–5.2)
Alkaline Phosphatase: 65 U/L (ref 39–117)
BUN: 14 mg/dL (ref 6–23)
Creat: 0.91 mg/dL (ref 0.50–1.10)
Total Bilirubin: 0.4 mg/dL (ref 0.3–1.2)

## 2013-08-01 LAB — URINE CULTURE: Colony Count: >=100000

## 2013-08-04 ENCOUNTER — Ambulatory Visit: Payer: Medicare Other | Admitting: Internal Medicine

## 2013-08-06 ENCOUNTER — Encounter: Payer: Self-pay | Admitting: Cardiology

## 2013-08-06 ENCOUNTER — Ambulatory Visit (HOSPITAL_COMMUNITY): Payer: Medicare Other | Attending: Internal Medicine

## 2013-08-06 DIAGNOSIS — R42 Dizziness and giddiness: Secondary | ICD-10-CM

## 2013-08-06 DIAGNOSIS — I6529 Occlusion and stenosis of unspecified carotid artery: Secondary | ICD-10-CM | POA: Diagnosis not present

## 2013-08-08 ENCOUNTER — Other Ambulatory Visit: Payer: Medicare Other

## 2013-08-09 ENCOUNTER — Encounter: Payer: Self-pay | Admitting: Family Medicine

## 2013-08-10 ENCOUNTER — Other Ambulatory Visit: Payer: Self-pay | Admitting: Internal Medicine

## 2013-08-10 ENCOUNTER — Inpatient Hospital Stay: Admission: RE | Admit: 2013-08-10 | Payer: Medicare Other | Source: Ambulatory Visit

## 2013-08-10 ENCOUNTER — Ambulatory Visit
Admission: RE | Admit: 2013-08-10 | Discharge: 2013-08-10 | Disposition: A | Discharge status: Home / Self Care | Payer: Medicare Other | Source: Ambulatory Visit | Attending: Internal Medicine | Admitting: Internal Medicine

## 2013-08-10 DIAGNOSIS — R42 Dizziness and giddiness: Secondary | ICD-10-CM

## 2013-08-12 ENCOUNTER — Encounter: Payer: Self-pay | Admitting: Internal Medicine

## 2013-08-12 ENCOUNTER — Encounter: Payer: Self-pay | Admitting: Family Medicine

## 2013-08-13 ENCOUNTER — Telehealth: Payer: Self-pay | Admitting: *Deleted

## 2013-08-13 DIAGNOSIS — R42 Dizziness and giddiness: Secondary | ICD-10-CM

## 2013-08-13 MED ORDER — AMOXICILLIN 500 MG PO CAPS: 500.0000 mg | ORAL_CAPSULE | Freq: Three times a day (TID) | ORAL | Status: DC

## 2013-08-13 NOTE — Telephone Encounter (Signed)
Pt notified of MRI results will like to be referred to neurologist. Pt states sent a mychart message to Dr.Paz regarding her chronic right breast pain, states that she's been having "horrific pain" on the left side of her right breast happens 1 or 2 times a week been going on for years ,also states water seems to alleviate the pain.   Please advise. DJR

## 2013-08-13 NOTE — Telephone Encounter (Signed)
Referral to neurology entered. Recently I recommended a mammogram and to see gynecology (for breast exam), I encouraged her to do that. Also may like to try OTC vitamin E OTC to see if that helped with breast pain.

## 2013-08-13 NOTE — Telephone Encounter (Signed)
Message copied by Eustace Quail on Wed Aug 13, 2013  5:08 PM ------      Message from: [name]      Created: Wed Aug 13, 2013  1:19 PM       Please call the patient:      MRI of the brain did not show anything to explain her dizziness, she does have some atherosclerosis. 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 Nothing needs to be done about it, If anything repeat the MRI in 2 years.      We can discuss more when she tends back for a routine checkup. If she likes to see if a neurologist  regards dizziness let me know ------

## 2013-08-13 NOTE — Addendum Note (Signed)
Addended by: Willow Ora E on: 08/13/2013 06:10 PM   Modules accepted: Orders

## 2013-08-14 NOTE — Telephone Encounter (Signed)
Notified pt of referral . Pt now states that her pain is more consistent of chest pain not necessary  breast pain? doesnt think see needs to see GYN.   Please advise -DJR

## 2013-08-14 NOTE — Telephone Encounter (Signed)
Pt notified. Has stress test scheduled and will let us know if s/s increase. DJR

## 2013-08-14 NOTE — Telephone Encounter (Signed)
We talk about her chest pain at the time of her physical 2 months ago, I recommended a stress test, I don't believe she proceeded. Recommend to go ahead and do the stress test. If the chest pain is getting more frequent or severe then she needs to be seen ASAP

## 2013-08-20 ENCOUNTER — Ambulatory Visit (HOSPITAL_COMMUNITY): Payer: Medicare Other | Attending: Internal Medicine | Admitting: Radiology

## 2013-08-20 DIAGNOSIS — R0989 Other specified symptoms and signs involving the circulatory and respiratory systems: Secondary | ICD-10-CM

## 2013-08-20 DIAGNOSIS — R079 Chest pain, unspecified: Secondary | ICD-10-CM

## 2013-08-20 MED ORDER — TECHNETIUM TC 99M SESTAMIBI GENERIC - CARDIOLITE
11.0000 | Freq: Once | INTRAVENOUS | Status: AC | PRN
  Administered 2013-08-20: 11 via INTRAVENOUS

## 2013-08-27 ENCOUNTER — Encounter (HOSPITAL_COMMUNITY): Payer: Medicare Other

## 2013-10-01 ENCOUNTER — Encounter (HOSPITAL_COMMUNITY): Payer: Self-pay | Admitting: Radiology

## 2013-10-01 ENCOUNTER — Telehealth: Payer: Self-pay | Admitting: Internal Medicine

## 2013-10-01 NOTE — Telephone Encounter (Addendum)
Spoke with spouse. She was called out of town for a funeral. While there she has contracted the flu and has been unable to fly home as of yet. I have also left a message on her voice mail to give a call back.   Patient returned my vm message. States that when she gets better and returns from [place] she will follow up with cardio and complete the stress test. Gave precautions to be aware of for secondary infection.  Thanks Dr Larose Kells for following up with her.

## 2013-10-01 NOTE — Telephone Encounter (Signed)
Thanks

## 2013-10-01 NOTE — Telephone Encounter (Signed)
See note below  from cardiology, please advise patient to reschedule her stress test JP -----  Patient ID: [name], female DOB: Apr 19, 1946, 68 y.o. MRN: [id number]  Patient came in on 08/20/13 for a Myocardial Perfusion Study ordered by Dr. Larose Kells. Due to having Caffeine AM of test, a Rest Only study was completed and the patient was scheduled for the Stress study on 08/27/13. She later called and cancelled this appointment due to her husband's illness and stated she would call back to reschedule. [name], in the Nuclear area, called the patient's home number on 09/17/13 and left a message to call and reschedule the Stress appt. The patient did not return this call. Dr. Larose Kells office will be informed. [name], Rt-N

## 2013-10-01 NOTE — Progress Notes (Signed)
Patient ID: [name], female   DOB: 10-29-1945, 68 y.o.   MRN: [id number] Patient came in on 08/20/13 for a Myocardial Perfusion Study ordered by Dr. Larose Kells. Due to having Caffeine AM of test, a Rest Only study was completed and the patient was scheduled for the Stress study on 08/27/13. She later called and cancelled this appointment due to her husband's illness and stated she would call back to reschedule. [name], in the Nuclear area, called the patient's home number on 09/17/13 and left a message to call and reschedule the Stress appt. The patient did not return this call. Dr. Larose Kells office will be informed.    [name], Rt-N

## 2013-10-01 NOTE — Progress Notes (Signed)
   Subjective:    Patient ID: [name], female    DOB: 1945-12-17, 68 y.o.   MRN: [id number]  HPI    Review of Systems     Objective:   Physical Exam        Assessment & Plan:

## 2013-10-20 ENCOUNTER — Encounter (HOSPITAL_COMMUNITY): Payer: Self-pay | Admitting: Emergency Medicine

## 2013-10-20 ENCOUNTER — Observation Stay (HOSPITAL_COMMUNITY): Payer: Medicare Other

## 2013-10-20 ENCOUNTER — Observation Stay (HOSPITAL_COMMUNITY)
Admission: EM | Admit: 2013-10-20 | Discharge: 2013-10-22 | Disposition: A | Discharge status: Home / Self Care | Payer: Medicare Other | Attending: Internal Medicine | Admitting: Internal Medicine

## 2013-10-20 ENCOUNTER — Emergency Department (HOSPITAL_COMMUNITY): Payer: Medicare Other

## 2013-10-20 DIAGNOSIS — J9819 Other pulmonary collapse: Secondary | ICD-10-CM | POA: Diagnosis not present

## 2013-10-20 DIAGNOSIS — M542 Cervicalgia: Secondary | ICD-10-CM | POA: Diagnosis not present

## 2013-10-20 DIAGNOSIS — K2970 Gastritis, unspecified, without bleeding: Secondary | ICD-10-CM | POA: Insufficient documentation

## 2013-10-20 DIAGNOSIS — F319 Bipolar disorder, unspecified: Secondary | ICD-10-CM | POA: Diagnosis not present

## 2013-10-20 DIAGNOSIS — F3289 Other specified depressive episodes: Secondary | ICD-10-CM

## 2013-10-20 DIAGNOSIS — I6529 Occlusion and stenosis of unspecified carotid artery: Secondary | ICD-10-CM

## 2013-10-20 DIAGNOSIS — F17200 Nicotine dependence, unspecified, uncomplicated: Secondary | ICD-10-CM | POA: Insufficient documentation

## 2013-10-20 DIAGNOSIS — K5989 Other specified functional intestinal disorders: Secondary | ICD-10-CM | POA: Diagnosis not present

## 2013-10-20 DIAGNOSIS — R42 Dizziness and giddiness: Secondary | ICD-10-CM | POA: Diagnosis not present

## 2013-10-20 DIAGNOSIS — Z791 Long term (current) use of non-steroidal anti-inflammatories (NSAID): Secondary | ICD-10-CM | POA: Diagnosis not present

## 2013-10-20 DIAGNOSIS — Z872 Personal history of diseases of the skin and subcutaneous tissue: Secondary | ICD-10-CM | POA: Insufficient documentation

## 2013-10-20 DIAGNOSIS — K219 Gastro-esophageal reflux disease without esophagitis: Secondary | ICD-10-CM | POA: Diagnosis not present

## 2013-10-20 DIAGNOSIS — R51 Headache: Secondary | ICD-10-CM | POA: Diagnosis not present

## 2013-10-20 DIAGNOSIS — K2980 Duodenitis without bleeding: Secondary | ICD-10-CM | POA: Diagnosis not present

## 2013-10-20 DIAGNOSIS — K5900 Constipation, unspecified: Secondary | ICD-10-CM | POA: Insufficient documentation

## 2013-10-20 DIAGNOSIS — K5909 Other constipation: Secondary | ICD-10-CM

## 2013-10-20 DIAGNOSIS — M549 Dorsalgia, unspecified: Secondary | ICD-10-CM

## 2013-10-20 DIAGNOSIS — F329 Major depressive disorder, single episode, unspecified: Secondary | ICD-10-CM

## 2013-10-20 DIAGNOSIS — I2510 Atherosclerotic heart disease of native coronary artery without angina pectoris: Secondary | ICD-10-CM | POA: Diagnosis not present

## 2013-10-20 DIAGNOSIS — R079 Chest pain, unspecified: Secondary | ICD-10-CM | POA: Diagnosis not present

## 2013-10-20 DIAGNOSIS — Z0000 Encounter for general adult medical examination without abnormal findings: Secondary | ICD-10-CM

## 2013-10-20 DIAGNOSIS — K2990 Gastroduodenitis, unspecified, without bleeding: Secondary | ICD-10-CM

## 2013-10-20 DIAGNOSIS — I10 Essential (primary) hypertension: Secondary | ICD-10-CM | POA: Diagnosis not present

## 2013-10-20 DIAGNOSIS — D329 Benign neoplasm of meninges, unspecified: Secondary | ICD-10-CM

## 2013-10-20 DIAGNOSIS — Z79899 Other long term (current) drug therapy: Secondary | ICD-10-CM | POA: Insufficient documentation

## 2013-10-20 DIAGNOSIS — R0789 Other chest pain: Secondary | ICD-10-CM | POA: Diagnosis not present

## 2013-10-20 DIAGNOSIS — M1990 Unspecified osteoarthritis, unspecified site: Secondary | ICD-10-CM

## 2013-10-20 DIAGNOSIS — G2581 Restless legs syndrome: Secondary | ICD-10-CM

## 2013-10-20 DIAGNOSIS — R110 Nausea: Secondary | ICD-10-CM

## 2013-10-20 DIAGNOSIS — K598 Other specified functional intestinal disorders: Secondary | ICD-10-CM

## 2013-10-20 DIAGNOSIS — D320 Benign neoplasm of cerebral meninges: Secondary | ICD-10-CM | POA: Diagnosis not present

## 2013-10-20 LAB — POCT I-STAT, CHEM 8
BUN: 14 mg/dL (ref 6–23)
Calcium, Ion: 1.17 mmol/L (ref 1.13–1.30)
Chloride: 97 mEq/L (ref 96–112)
Creatinine, Ser: 1 mg/dL (ref 0.50–1.10)
Glucose, Bld: 129 mg/dL — ABNORMAL HIGH (ref 70–99)
HCT: 39 % (ref 36.0–46.0)
HEMOGLOBIN: 13.3 g/dL (ref 12.0–15.0)
Potassium: 3.4 mEq/L — ABNORMAL LOW (ref 3.7–5.3)
Sodium: 139 mEq/L (ref 137–147)
TCO2: 29 mmol/L (ref 0–100)

## 2013-10-20 LAB — CBC
HCT: 36.1 % (ref 36.0–46.0)
Hemoglobin: 11.8 g/dL — ABNORMAL LOW (ref 12.0–15.0)
MCH: 29.7 pg (ref 26.0–34.0)
MCHC: 32.7 g/dL (ref 30.0–36.0)
MCV: 90.9 fL (ref 78.0–100.0)
Platelets: 232 10*3/uL (ref 150–400)
RBC: 3.97 MIL/uL (ref 3.87–5.11)
RDW: 13.7 % (ref 11.5–15.5)
WBC: 7.7 10*3/uL (ref 4.0–10.5)

## 2013-10-20 LAB — BASIC METABOLIC PANEL
BUN: 15 mg/dL (ref 6–23)
CHLORIDE: 100 meq/L (ref 96–112)
CO2: 30 meq/L (ref 19–32)
CREATININE: 0.91 mg/dL (ref 0.50–1.10)
Calcium: 9 mg/dL (ref 8.4–10.5)
GFR calc Af Amer: 72 mL/min — ABNORMAL LOW (ref >90)
GFR calc non Af Amer: 62 mL/min — ABNORMAL LOW (ref >90)
GLUCOSE: 126 mg/dL — AB (ref 70–99)
Potassium: 4.3 mEq/L (ref 3.7–5.3)
Sodium: 140 mEq/L (ref 137–147)

## 2013-10-20 LAB — POCT I-STAT TROPONIN I: TROPONIN I, POC: 0 ng/mL (ref 0.00–0.08)

## 2013-10-20 LAB — MAGNESIUM: MAGNESIUM: 1.9 mg/dL (ref 1.5–2.5)

## 2013-10-20 LAB — TROPONIN I
Troponin I: <0.3 ng/mL (ref <0.30)
Troponin I: <0.3 ng/mL (ref <0.30)
Troponin I: <0.3 ng/mL (ref <0.30)

## 2013-10-20 MED ORDER — MORPHINE SULFATE 2 MG/ML IJ SOLN
2.0000 mg | Freq: Once | INTRAMUSCULAR | Status: AC
  Administered 2013-10-20: 2 mg via INTRAVENOUS
  Filled 2013-10-20: qty 1

## 2013-10-20 MED ORDER — ONDANSETRON HCL 4 MG/2ML IJ SOLN
4.0000 mg | Freq: Once | INTRAMUSCULAR | Status: AC
  Administered 2013-10-20: 4 mg via INTRAVENOUS
  Filled 2013-10-20: qty 2

## 2013-10-20 MED ORDER — BISACODYL 5 MG PO TBEC
5.0000 mg | DELAYED_RELEASE_TABLET | Freq: Every day | ORAL | Status: DC | PRN
  Filled 2013-10-20: qty 1

## 2013-10-20 MED ORDER — ROPINIROLE HCL 1 MG PO TABS
2.0000 mg | ORAL_TABLET | Freq: Every day | ORAL | Status: DC
  Administered 2013-10-20 – 2013-10-21 (×2): 2 mg via ORAL
  Filled 2013-10-20 (×3): qty 2

## 2013-10-20 MED ORDER — POLYETHYLENE GLYCOL 3350 17 G PO PACK
17.0000 g | PACK | Freq: Every day | ORAL | Status: DC
  Filled 2013-10-20 (×3): qty 1

## 2013-10-20 MED ORDER — CLONAZEPAM 1 MG PO TABS
2.0000 mg | ORAL_TABLET | Freq: Every day | ORAL | Status: DC
  Administered 2013-10-20 – 2013-10-21 (×2): 2 mg via ORAL
  Filled 2013-10-20 (×2): qty 2

## 2013-10-20 MED ORDER — ONDANSETRON HCL 4 MG/2ML IJ SOLN: 4.0000 mg | Freq: Four times a day (QID) | INTRAMUSCULAR | Status: DC | PRN

## 2013-10-20 MED ORDER — ENOXAPARIN SODIUM 40 MG/0.4ML ~~LOC~~ SOLN
40.0000 mg | SUBCUTANEOUS | Status: DC
  Administered 2013-10-21: 40 mg via SUBCUTANEOUS
  Filled 2013-10-20 (×3): qty 0.4

## 2013-10-20 MED ORDER — MORPHINE SULFATE 4 MG/ML IJ SOLN: 4.0000 mg | Freq: Once | INTRAMUSCULAR | Status: DC

## 2013-10-20 MED ORDER — ASPIRIN 81 MG PO CHEW
324.0000 mg | CHEWABLE_TABLET | Freq: Once | ORAL | Status: AC
Start: 2013-10-20 — End: 2013-10-20
  Administered 2013-10-20: 324 mg via ORAL
  Filled 2013-10-20: qty 4

## 2013-10-20 MED ORDER — CLONAZEPAM 1 MG PO TABS
1.0000 mg | ORAL_TABLET | Freq: Every day | ORAL | Status: DC
  Administered 2013-10-20 – 2013-10-22 (×3): 1 mg via ORAL
  Filled 2013-10-20 (×3): qty 1

## 2013-10-20 MED ORDER — ACETAMINOPHEN 650 MG RE SUPP: 650.0000 mg | Freq: Four times a day (QID) | RECTAL | Status: DC | PRN

## 2013-10-20 MED ORDER — NAPROXEN 500 MG PO TABS
500.0000 mg | ORAL_TABLET | Freq: Two times a day (BID) | ORAL | Status: DC | PRN
  Administered 2013-10-20: 500 mg via ORAL
  Filled 2013-10-20: qty 1

## 2013-10-20 MED ORDER — ACETAMINOPHEN 325 MG PO TABS
650.0000 mg | ORAL_TABLET | Freq: Four times a day (QID) | ORAL | Status: DC | PRN
  Filled 2013-10-20: qty 2

## 2013-10-20 MED ORDER — POTASSIUM CHLORIDE CRYS ER 20 MEQ PO TBCR
40.0000 meq | EXTENDED_RELEASE_TABLET | Freq: Once | ORAL | Status: AC
  Administered 2013-10-20: 40 meq via ORAL
  Filled 2013-10-20: qty 2

## 2013-10-20 MED ORDER — NITROGLYCERIN 0.4 MG SL SUBL: 0.4000 mg | SUBLINGUAL_TABLET | SUBLINGUAL | Status: DC | PRN

## 2013-10-20 MED ORDER — AMLODIPINE BESYLATE 5 MG PO TABS
5.0000 mg | ORAL_TABLET | Freq: Every day | ORAL | Status: DC
  Administered 2013-10-20 – 2013-10-22 (×3): 5 mg via ORAL
  Filled 2013-10-20 (×3): qty 1

## 2013-10-20 MED ORDER — SODIUM CHLORIDE 0.9 % IJ SOLN
3.0000 mL | Freq: Two times a day (BID) | INTRAMUSCULAR | Status: DC
  Administered 2013-10-20 – 2013-10-22 (×3): 3 mL via INTRAVENOUS

## 2013-10-20 MED ORDER — POTASSIUM CHLORIDE CRYS ER 20 MEQ PO TBCR
20.0000 meq | EXTENDED_RELEASE_TABLET | Freq: Once | ORAL | Status: AC
  Administered 2013-10-20: 20 meq via ORAL
  Filled 2013-10-20: qty 1

## 2013-10-20 MED ORDER — ONDANSETRON HCL 4 MG PO TABS: 4.0000 mg | ORAL_TABLET | Freq: Four times a day (QID) | ORAL | Status: DC | PRN

## 2013-10-20 MED ORDER — ASPIRIN 325 MG PO TABS
325.0000 mg | ORAL_TABLET | Freq: Every day | ORAL | Status: DC
  Administered 2013-10-21 – 2013-10-22 (×2): 325 mg via ORAL
  Filled 2013-10-20 (×2): qty 1

## 2013-10-20 MED ORDER — FAMOTIDINE 20 MG PO TABS
20.0000 mg | ORAL_TABLET | Freq: Two times a day (BID) | ORAL | Status: DC
  Administered 2013-10-20 – 2013-10-21 (×4): 20 mg via ORAL
  Filled 2013-10-20 (×6): qty 1

## 2013-10-20 MED ORDER — DULOXETINE HCL 30 MG PO CPEP
30.0000 mg | ORAL_CAPSULE | Freq: Every day | ORAL | Status: DC
  Administered 2013-10-20 – 2013-10-21 (×2): 30 mg via ORAL
  Filled 2013-10-20 (×3): qty 1

## 2013-10-20 NOTE — ED Provider Notes (Signed)
CSN: [id number]     Arrival date & time 10/20/13  0522 History   First MD Initiated Contact with Patient 10/20/13 [phone]     Chief Complaint  Patient presents with  . Chest Pain  . Neck Pain   (Consider location/radiation/quality/duration/timing/severity/associated sxs/prior Treatment) HPI History provided by patient. On off chest pains for the last week, left-sided and mild/moderate in severity. Tonight at home at rest developed chest pain now radiates to her neck with some shortness of breath. No nausea vomiting. No diaphoresis. States history of heart disease. No stents. is not followed by cardiology. She also complains of mild to moderate headache today. No weakness or numbness. No difficulty with vision or speech.  Past Medical History  Diagnosis Date  . Hypertension   . Constipation     chronic;severe  . CAD (coronary artery disease)     intra and extracranial vascular dx per MRI 4/11, neurology rec strict CVRF control  . Depression   . Psoriasis     sees derm  . EKG abnormalities     changes, stress test neg (false EKG changes)  . Colonic inertia   . Bipolar disorder   . GERD (gastroesophageal reflux disease)   . Gastritis   . Duodenitis    Past Surgical History  Procedure Laterality Date  . Tubal ligation    . Arthroscopy  04/2010    Right knee   Family History  Problem Relation Age of Onset  . Breast cancer Mother     metastisis to bones  . Diabetes Son   . Heart disease      grandfather   . Alcohol abuse Brother   . Colon cancer Neg Hx   . Heart disease Maternal Aunt    History  Substance Use Topics  . Smoking status: Current Every Day Smoker -- 1.00 packs/day    Types: Cigarettes  . Smokeless tobacco: Never Used  . Alcohol Use: 1.2 oz/week    1 Cans of beer, 1 Glasses of wine per week     Comment: yes on occassion   OB History   Grav Para Term Preterm Abortions TAB SAB Ect Mult Living                 Review of Systems  Constitutional: Negative for  fever and chills.  Respiratory: Negative for cough.   Cardiovascular: Positive for chest pain.  Gastrointestinal: Negative for abdominal pain.  Genitourinary: Negative for dysuria.  Musculoskeletal: Negative for back pain, neck pain and neck stiffness.  Skin: Negative for rash.  Neurological: Negative for weakness and numbness.  All other systems reviewed and are negative.    Allergies  Review of patient's allergies indicates no known allergies.  Home Medications   Current Outpatient Rx  Name  Route  Sig  Dispense  Refill  . bisacodyl (DULCOLAX) 5 MG EC tablet   Oral   Take 5 mg by mouth daily as needed for constipation.         . Cimetidine (ACID REDUCER PO)   Oral   Take by mouth. Takes one by mouth once daily, may take one depending on how bad she feels          . clonazePAM (KLONOPIN) 2 MG tablet      1 mg in the a.m and afternoon, 2 mg at night         . DULoxetine (CYMBALTA) 30 MG capsule   Oral   Take 30 mg by mouth daily.         Marland Kitchen  magnesium gluconate (MAGONATE) 500 MG tablet   Oral   Take 1,000 mg by mouth daily.          . naproxen sodium (ANAPROX) 220 MG tablet   Oral   Take 220 mg by mouth 2 (two) times daily with a meal.         . polyethylene glycol (MIRALAX / GLYCOLAX) packet   Oral   Take 17 g by mouth daily.         Marland Kitchen rOPINIRole (REQUIP) 2 MG tablet      TAKE 1 TABLET BY MOUTH AT BEDTIME   30 tablet   0   . amLODipine (NORVASC) 5 MG tablet   Oral   Take 1 tablet (5 mg total) by mouth daily.   30 tablet   3    BP 150/74  Temp(Src) 97.8 F (36.6 C) (Oral)  Resp 18  SpO2 96% Physical Exam  Constitutional: She is oriented to person, place, and time. She appears well-developed and well-nourished.  HENT:  Head: Normocephalic and atraumatic.  Eyes: EOM are normal. Pupils are equal, round, and reactive to light.  Neck: Neck supple.  Cardiovascular: Normal rate, regular rhythm and intact distal pulses.   Pulmonary/Chest:  Effort normal and breath sounds normal. No respiratory distress. She exhibits no tenderness.  Abdominal: Soft. She exhibits no distension. There is no tenderness.  Musculoskeletal: Normal range of motion. She exhibits no edema.  No calf tenderness or lower extremity erythema  Neurological: She is alert and oriented to person, place, and time.  Skin: Skin is warm and dry.    ED Course  Procedures (including critical care time) Labs Review Labs Reviewed  POCT I-STAT, CHEM 8 - Abnormal; Notable for the following:    Potassium 3.4 (*)    Glucose, Bld 129 (*)    All other components within normal limits  CBC  POCT I-STAT TROPONIN I   Imaging Review No results found.  EKG Interpretation    Date/Time:  Monday October 20 2013 05:30:21 EST Ventricular Rate:  63 PR Interval:  160 QRS Duration: 89 QT Interval:  419 QTC Calculation: 429 R Axis:   38 Text Interpretation:  Sinus rhythm Artifact in lead(s) I II III aVR aVL aVF and baseline wander in lead(s) II III aVF Nonspecific ST abnormality Confirmed by Danikah Budzik  MD, Lynnel Zanetti ([id number]) on 10/20/2013 6:11:54 AM           Aspirin. Nitroglycerin. IV morphine. Potassium for hypokalemia  Medicine consulted, [facility] evaluated bedside for admission MDM  Diagnosis: Chest pain  ECG, labs, CXR Medications provided MED admit  Teressa Lower, MD 10/20/13 [phone]
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
## 2013-10-20 NOTE — ED Notes (Signed)
Patient with chest pain on and off for two weeks.  Patient has also been having HAs, neck pain and nausea intermittantly for the last two weeks.  Patient is CAOx3.

## 2013-10-21 ENCOUNTER — Observation Stay (HOSPITAL_COMMUNITY): Payer: Medicare Other

## 2013-10-21 DIAGNOSIS — D329 Benign neoplasm of meninges, unspecified: Secondary | ICD-10-CM | POA: Diagnosis present

## 2013-10-21 DIAGNOSIS — C719 Malignant neoplasm of brain, unspecified: Secondary | ICD-10-CM | POA: Diagnosis not present

## 2013-10-21 DIAGNOSIS — R079 Chest pain, unspecified: Secondary | ICD-10-CM

## 2013-10-21 DIAGNOSIS — R42 Dizziness and giddiness: Secondary | ICD-10-CM

## 2013-10-21 DIAGNOSIS — F329 Major depressive disorder, single episode, unspecified: Secondary | ICD-10-CM | POA: Diagnosis not present

## 2013-10-21 DIAGNOSIS — F3289 Other specified depressive episodes: Secondary | ICD-10-CM | POA: Diagnosis not present

## 2013-10-21 DIAGNOSIS — R51 Headache: Secondary | ICD-10-CM | POA: Diagnosis not present

## 2013-10-21 DIAGNOSIS — D320 Benign neoplasm of cerebral meninges: Secondary | ICD-10-CM | POA: Diagnosis not present

## 2013-10-21 DIAGNOSIS — I517 Cardiomegaly: Secondary | ICD-10-CM

## 2013-10-21 HISTORY — DX: Benign neoplasm of meninges, unspecified: D32.9

## 2013-10-21 LAB — LIPID PANEL
CHOLESTEROL: 188 mg/dL (ref 0–200)
HDL: 59 mg/dL (ref >39)
LDL CALC: 109 mg/dL — AB (ref 0–99)
TRIGLYCERIDES: 99 mg/dL (ref <150)
Total CHOL/HDL Ratio: 3.2 RATIO
VLDL: 20 mg/dL (ref 0–40)

## 2013-10-21 LAB — BASIC METABOLIC PANEL
BUN: 16 mg/dL (ref 6–23)
CO2: 31 mEq/L (ref 19–32)
Calcium: 8.9 mg/dL (ref 8.4–10.5)
Chloride: 104 mEq/L (ref 96–112)
Creatinine, Ser: 0.89 mg/dL (ref 0.50–1.10)
GFR calc Af Amer: 74 mL/min — ABNORMAL LOW (ref >90)
GFR, EST NON AFRICAN AMERICAN: 64 mL/min — AB (ref >90)
GLUCOSE: 92 mg/dL (ref 70–99)
POTASSIUM: 4.2 meq/L (ref 3.7–5.3)
SODIUM: 145 meq/L (ref 137–147)

## 2013-10-21 MED ORDER — GADOBENATE DIMEGLUMINE 529 MG/ML IV SOLN
20.0000 mL | Freq: Once | INTRAVENOUS | Status: AC
  Administered 2013-10-21: 18 mL via INTRAVENOUS

## 2013-10-21 MED ORDER — TRAMADOL HCL 50 MG PO TABS
50.0000 mg | ORAL_TABLET | Freq: Once | ORAL | Status: AC
  Administered 2013-10-21: 50 mg via ORAL
  Filled 2013-10-21 (×2): qty 1
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
## 2013-10-21 NOTE — Progress Notes (Signed)
*  PRELIMINARY RESULTS* Vascular Ultrasound Carotid Duplex (Doppler) has been completed.   Findings suggest 1-39% internal carotid artery stenosis bilaterally. Vertebral arteries are patent with antegrade flow.  10/21/2013 4:51 PM Maudry Mayhew, RVT, RDCS, RDMS

## 2013-10-21 NOTE — Progress Notes (Signed)
CSW Armed forces technical officer) saw and spoke with pt and pt sister. CSW provided pt with resources. Full assessment to follow.  [place], [place]

## 2013-10-21 NOTE — Progress Notes (Signed)
UR completed 

## 2013-10-21 NOTE — Consult Note (Signed)
CARDIOLOGY CONSULT NOTE       Patient ID: [name] MRN: [id number] DOB/AGE: 1946-07-09 68 y.o.  Admit date: 10/20/2013 Referring Physician:  Tyrell Antonio Primary Physician: Kathlene November, MD Primary Cardiologist:    [facility]  Reason for Consultation:  Chest pain   Active Problems:   Chest pain   HPI:   68 yo patient admitted with atypical chest pain. CRF HTN and smoking     Two-week history of intermittent sharp chest pain. The patient states that it usually lasts a few seconds. It occurs mostly at rest. However, on the night of admission, the patient complained of chest pain that radiated to her neck. She stated to the chest pain lasted a few minutes before subsiding. There was associated dizziness with nausea. The patient denied any syncope, shortness of breath, coughing, hemoptysis. The patient denies any fevers, chills, abdominal pain, dysuria, hematuria.  The patient has been noncompliant with her antihypertensive medications. She smokes one half pack per day. She has smoked up to one pack per day x50+ years. She denies any alcohol or drug use. Patient states that she takes approximately 28 tablets of naproxen weekly for headache.   In ED, the patient was hemodynamically stable. She was chest pain-free. I-STAT done troponin was negative. EKG was sinus rhythm without any ST-T wave   This am has had lots of issues with headache radiating to occipital area No history of migraines  Reviewed her cath from 11/2006 done by Dr Olevia Perches and normal with no CAD    ROS All other systems reviewed and negative except as noted above  Past Medical History  Diagnosis Date  . Hypertension   . Constipation     chronic;severe  . CAD (coronary artery disease)     intra and extracranial vascular dx per MRI 4/11, neurology rec strict CVRF control  . Depression   . Psoriasis     sees derm  . EKG abnormalities     changes, stress test neg (false EKG changes)  . Colonic inertia   . Bipolar  disorder   . GERD (gastroesophageal reflux disease)   . Gastritis   . Duodenitis     Family History  Problem Relation Age of Onset  . Breast cancer Mother     metastisis to bones  . Diabetes Son   . Heart disease      grandfather   . Alcohol abuse Brother   . Colon cancer Neg Hx   . Heart disease Maternal Aunt     History   Social History  . Marital Status: Married    Spouse Name: N/A    Number of Children: 1  . Years of Education: N/A   Occupational History  . retired    Social History Main Topics  . Smoking status: Current Every Day Smoker -- 1.00 packs/day    Types: Cigarettes  . Smokeless tobacco: Never Used  . Alcohol Use: 1.2 oz/week    1 Cans of beer, 1 Glasses of wine per week     Comment: yes on occassion  . Drug Use: No  . Sexual Activity: Not on file   Other Topics Concern  . Not on file   Social History Narrative   Brother in law Mr Rexford Maus (one of my patients)   Lives w/ husband        Past Surgical History  Procedure Laterality Date  . Tubal ligation    . Arthroscopy  04/2010    Right knee     .  amLODipine  5 mg Oral Daily  . aspirin  325 mg Oral Daily  . clonazePAM  1 mg Oral Daily  . clonazePAM  2 mg Oral QHS  . DULoxetine  30 mg Oral Daily  . enoxaparin (LOVENOX) injection  40 mg Subcutaneous Q24H  . famotidine  20 mg Oral BID  . polyethylene glycol  17 g Oral Daily  . rOPINIRole  2 mg Oral QHS  . sodium chloride  3 mL Intravenous Q12H      Physical Exam: Blood pressure 157/76, pulse 63, temperature 98 F (36.7 C), temperature source Oral, resp. rate 20, height 5\' 8"  (1.727 m), weight 195 lb (88.451 kg), SpO2 95.00%.   Affect appropriate Healthy:  appears stated age 68: normal Neck supple with no adenopathy JVP normal no bruits no thyromegaly Lungs clear with no wheezing and good diaphragmatic motion Heart:  S1/S2 no murmur, no rub, gallop or click PMI normal Abdomen: benighn, BS positve, no tenderness, no AAA no  bruit.  No HSM or HJR Distal pulses intact with no bruits No edema Neuro non-focal Skin warm and dry No muscular weakness   Labs:   Lab Results  Component Value Date   WBC 7.7 10/20/2013   HGB 13.3 10/20/2013   HCT 39.0 10/20/2013   MCV 90.9 10/20/2013   PLT 232 10/20/2013    Recent Labs Lab 10/21/13 0430  NA 145  K 4.2  CL 104  CO2 31  BUN 16  CREATININE 0.89  CALCIUM 8.9  GLUCOSE 92   Lab Results  Component Value Date   TROPONINI <0.30 10/20/2013    Lab Results  Component Value Date   CHOL 188 10/21/2013   CHOL 233* 08/15/2010   CHOL 199 01/19/2010   Lab Results  Component Value Date   HDL 59 10/21/2013   HDL 64.40 08/15/2010   HDL 75.60 01/19/2010   Lab Results  Component Value Date   LDLCALC 109* 10/21/2013   LDLCALC 114* 01/19/2010   Lab Results  Component Value Date   TRIG 99 10/21/2013   TRIG 96.0 08/15/2010   TRIG 47.0 01/19/2010   Lab Results  Component Value Date   CHOLHDL 3.2 10/21/2013   CHOLHDL 4 08/15/2010   CHOLHDL 3 01/19/2010   Lab Results  Component Value Date   LDLDIRECT 135.6 08/15/2010      Radiology: Ct Head Wo Contrast  10/20/2013   CLINICAL DATA:  Frontal headache radiating posteriorly for several months with nausea and posterior neck pain, history hypertension, coronary artery disease  EXAM: CT HEAD WITHOUT CONTRAST  TECHNIQUE: Contiguous axial images were obtained from the base of the skull through the vertex without intravenous contrast.  COMPARISON:  None; correlation made with MRI brain 08/10/2013  FINDINGS: Normal ventricular morphology.  No midline shift or mass effect.  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  Small vessel chronic ischemic changes of deep cerebral white matter.  No intracranial hemorrhage or evidence of acute infarction.  Small hyperdense nodule left vertex 13 x 10 mm compatible with a tiny hemangioma, unchanged from prior MR.  No extra-axial fluid collections.  Mucosal retention cyst left maxillary sinus with small  fluid level in right maxillary sinus.  Atherosclerotic calcifications at skullbase.  Bones and sinuses otherwise unremarkable.  IMPRESSION: Small vessel chronic ischemic changes of deep cerebral white matter.  Small left vertex meningioma.  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   Electronically Signed   By: Lavonia Dana M.D.   On:  10/20/2013 15:48   Dg Chest Portable 1 View  10/20/2013   CLINICAL DATA:  Chest pain.  Neck pain.  EXAM: PORTABLE CHEST - 1 VIEW  COMPARISON:  01/15/2013.  FINDINGS: Cardiopericardial silhouette within normal limits. Mediastinal contours normal. Trachea midline. No airspace disease or effusion. Basilar atelectasis is present. Lung volumes are lower than on prior. Aortic arch atherosclerosis.  IMPRESSION: Low volume chest with basilar atelectasis. No acute cardiopulmonary disease.   Electronically Signed   By: Dereck Ligas M.D.   On: 10/20/2013 06:20    EKG:  NSR normal ECG   ASSESSMENT AND PLAN:  Chest Pain:  Atypical with normal ECG and r/o  Cath normal 2008  She indicates she thinks she can walk on treadmill.  She has been walking through airports going back and forth to [place] and so long As she can hold on to rail should be fine  Will order regular ETT  Chol:  LDL 109  Continue diet Rx Headache:  With pain in neck may be worthwhile to check carotid duplex r/o carotid disease/discection no focal neuro findings  PRN pain med HTN:  Continue amlodipine  stable  Signed: Jenkins Rouge 10/21/2013, 8:50 AM

## 2013-10-21 NOTE — Progress Notes (Signed)
Echocardiogram 2D Echocardiogram has been performed.  10/21/2013 4:52 PM Maudry Mayhew, RVT, RDCS, RDMS

## 2013-10-21 NOTE — Consult Note (Signed)
NEURO HOSPITALIST CONSULT NOTE    Reason for Consult:Meningioma seen on CT head  HPI:                                                                                                                                          [name] is an 68 y.o. female who was admitted to the hospital for CP.  While in hospital patient complained of a HA and CT head was obtained. CT brain showed small vessel chronic ischemic changes of deep cerebral white matter. Tiny old lacunar infarct at right caudate head and Small left vertex meningioma. This meningioma was unchanged from prior MRI on 08/10/2013. Neurology was consulted for recommendations on meningioma.   Patients main complaint is chronic daily HA for past 6 months in the posterior occipital region and in the past month circumferentially around the head.  IT is like a band and tight but not throbbing.  She takes naprosyn around the clock but has noted it does not provide relief as much as before.  Currently she has no HA but states when she does get the HA it is a 9/10. She admits to having a lot of stress in her life, needing Klonopin, Requip and benadryl at night.    Past Medical History  Diagnosis Date  . Hypertension   . Constipation     chronic;severe  . CAD (coronary artery disease)     intra and extracranial vascular dx per MRI 4/11, neurology rec strict CVRF control  . Depression   . Psoriasis     sees derm  . EKG abnormalities     changes, stress test neg (false EKG changes)  . Colonic inertia   . Bipolar disorder   . GERD (gastroesophageal reflux disease)   . Gastritis   . Duodenitis     Past Surgical History  Procedure Laterality Date  . Tubal ligation    . Arthroscopy  04/2010    Right knee    Family History  Problem Relation Age of Onset  . Breast cancer Mother     metastisis to bones  . Diabetes Son   . Heart disease      grandfather   . Alcohol abuse Brother   . Colon cancer Neg Hx   .  Heart disease Maternal Aunt     Social History:  reports that she has been smoking Cigarettes.  She has been smoking about 1.00 pack per day. She has never used smokeless tobacco. She reports that she drinks about 1.2 ounces of alcohol per week. She reports that she does not use illicit drugs.  No Known Allergies  MEDICATIONS:  Prior to Admission:  Prescriptions prior to admission  Medication Sig Dispense Refill  . bisacodyl (DULCOLAX) 5 MG EC tablet Take 5 mg by mouth daily as needed for constipation.      . Cimetidine (ACID REDUCER PO) Take by mouth. Takes one by mouth once daily, may take one depending on how bad she feels       . clonazePAM (KLONOPIN) 2 MG tablet 1 mg in the a.m and afternoon, 2 mg at night      . DULoxetine (CYMBALTA) 30 MG capsule Take 30 mg by mouth daily.      . magnesium gluconate (MAGONATE) 500 MG tablet Take 1,000 mg by mouth daily.       . naproxen sodium (ANAPROX) 220 MG tablet Take 220 mg by mouth 2 (two) times daily with a meal.      . polyethylene glycol (MIRALAX / GLYCOLAX) packet Take 17 g by mouth daily.      Marland Kitchen rOPINIRole (REQUIP) 2 MG tablet TAKE 1 TABLET BY MOUTH AT BEDTIME  30 tablet  0  . amLODipine (NORVASC) 5 MG tablet Take 1 tablet (5 mg total) by mouth daily.  30 tablet  3   Scheduled: . amLODipine  5 mg Oral Daily  . aspirin  325 mg Oral Daily  . clonazePAM  1 mg Oral Daily  . clonazePAM  2 mg Oral QHS  . DULoxetine  30 mg Oral Daily  . enoxaparin (LOVENOX) injection  40 mg Subcutaneous Q24H  . famotidine  20 mg Oral BID  . polyethylene glycol  17 g Oral Daily  . rOPINIRole  2 mg Oral QHS  . sodium chloride  3 mL Intravenous Q12H     ROS:                                                                                                                                       History obtained from the patient  General ROS:  negative for - chills, fatigue, fever, night sweats, weight gain or weight loss Psychological ROS: negative for - behavioral disorder, hallucinations, memory difficulties, mood swings or suicidal ideation Ophthalmic ROS: negative for - blurry vision, double vision, eye pain or loss of vision ENT ROS: negative for - epistaxis, nasal discharge, oral lesions, sore throat, tinnitus or vertigo Allergy and Immunology ROS: negative for - hives or itchy/watery eyes Hematological and Lymphatic ROS: negative for - bleeding problems, bruising or swollen lymph nodes Endocrine ROS: negative for - galactorrhea, hair pattern changes, polydipsia/polyuria or temperature intolerance Respiratory ROS: negative for - cough, hemoptysis, shortness of breath or wheezing Cardiovascular ROS: negative for - chest pain, dyspnea on exertion, edema or irregular heartbeat Gastrointestinal ROS: negative for - abdominal pain, diarrhea, hematemesis, nausea/vomiting or stool incontinence Genito-Urinary ROS: negative for - dysuria, hematuria, incontinence or urinary frequency/urgency Musculoskeletal ROS: negative for - joint swelling or muscular weakness Neurological ROS: as noted in HPI  Dermatological ROS: negative for rash and skin lesion changes   Blood pressure 142/69, pulse 67, temperature 98.3 F (36.8 C), temperature source Oral, resp. rate 20, height 5\' 8"  (1.727 m), weight 88.451 kg (195 lb), SpO2 96.00%.   Neurologic Examination:                                                                                                       Mental Status: Alert, oriented, thought content appropriate.  Speech fluent without evidence of aphasia.  Able to follow 3 step commands without difficulty. Cranial Nerves: II: Discs flat bilaterally; Visual fields grossly normal, pupils equal, round, reactive to light and accommodation III,IV, VI: ptosis not present, extra-ocular motions intact bilaterally V,VII: smile symmetric, facial  light touch sensation normal bilaterally VIII: hearing normal bilaterally IX,X: gag reflex present XI: bilateral shoulder shrug XII: midline tongue extension without atrophy or fasciculations  Motor: Right : Upper extremity   5/5    Left:     Upper extremity   5/5  Lower extremity   5/5     Lower extremity   5/5 Tone and bulk:normal tone throughout; no atrophy noted Sensory: Pinprick and light touch intact throughout, bilaterally Deep Tendon Reflexes:  Right: Upper Extremity   Left: Upper extremity   biceps (C-5 to C-6) 2/4   biceps (C-5 to C-6) 2/4 tricep (C7) 2/4    triceps (C7) 2/4 Brachioradialis (C6) 2/4  Brachioradialis (C6) 2/4  Lower Extremity Lower Extremity  quadriceps (L-2 to L-4) 2/4   quadriceps (L-2 to L-4) 2/4 Achilles (S1) 2/4   Achilles (S1) 2/4  Plantars: Right: downgoing   Left: downgoing Cerebellar: normal finger-to-nose,  normal heel-to-shin test CV: pulses palpable throughout   Lab Results: Basic Metabolic Panel:  Recent Labs Lab 10/20/13 0559 10/20/13 1124 10/21/13 0430  NA 139 140 145  K 3.4* 4.3 4.2  CL 97 100 104  CO2  --  30 31  GLUCOSE 129* 126* 92  BUN 14 15 16   CREATININE 1.00 0.91 0.89  CALCIUM  --  9.0 8.9  MG  --  1.9  --     Liver Function Tests: No results found for this basename: AST, ALT, ALKPHOS, BILITOT, PROT, ALBUMIN,  in the last 168 hours No results found for this basename: LIPASE, AMYLASE,  in the last 168 hours No results found for this basename: AMMONIA,  in the last 168 hours  CBC:  Recent Labs Lab 10/20/13 0548 10/20/13 0559  WBC 7.7  --   HGB 11.8* 13.3  HCT 36.1 39.0  MCV 90.9  --   PLT 232  --     Cardiac Enzymes:  Recent Labs Lab 10/20/13 1124 10/20/13 1655 10/20/13 2224  TROPONINI <0.30 <0.30 <0.30    Lipid Panel:  Recent Labs Lab 10/21/13 0430  CHOL 188  TRIG 99  HDL 59  CHOLHDL 3.2  VLDL 20  LDLCALC 109*    CBG: No results found for this basename: GLUCAP,  in the last 168  hours  Microbiology: Results for orders placed in visit on 07/31/13  URINE CULTURE     Status: None   Collection Time    07/31/13  5:52 PM      Result Value Range Status   Colony Count >=100,000 COLONIES/ML   Final   Organism ID, Bacteria VIRIDANS STREPTOCOCCUS   Final    Coagulation Studies: No results found for this basename: LABPROT, INR,  in the last 72 hours  Imaging: Ct Head Wo Contrast  10/20/2013   CLINICAL DATA:  Frontal headache radiating posteriorly for several months with nausea and posterior neck pain, history hypertension, coronary artery disease  EXAM: CT HEAD WITHOUT CONTRAST  TECHNIQUE: Contiguous axial images were obtained from the base of the skull through the vertex without intravenous contrast.  COMPARISON:  None; correlation made with MRI brain 08/10/2013  FINDINGS: Normal ventricular morphology.  No midline shift or mass effect.  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  Small vessel chronic ischemic changes of deep cerebral white matter.  No intracranial hemorrhage or evidence of acute infarction.  Small hyperdense nodule left vertex 13 x 10 mm compatible with a tiny hemangioma, unchanged from prior MR.  No extra-axial fluid collections.  Mucosal retention cyst left maxillary sinus with small fluid level in right maxillary sinus.  Atherosclerotic calcifications at skullbase.  Bones and sinuses otherwise unremarkable.  IMPRESSION: Small vessel chronic ischemic changes of deep cerebral white matter.  Small left vertex meningioma.  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   Electronically Signed   By: Lavonia Dana M.D.   On: 10/20/2013 15:48   Dg Chest Portable 1 View  10/20/2013   CLINICAL DATA:  Chest pain.  Neck pain.  EXAM: PORTABLE CHEST - 1 VIEW  COMPARISON:  01/15/2013.  FINDINGS: Cardiopericardial silhouette within normal limits. Mediastinal contours normal. Trachea midline. No airspace disease or effusion. Basilar atelectasis is present. Lung volumes are  lower than on prior. Aortic arch atherosclerosis.  IMPRESSION: Low volume chest with basilar atelectasis. No acute cardiopulmonary disease.   Electronically Signed   By: Dereck Ligas M.D.   On: 10/20/2013 06:20     Assessment and plan per attending neurologist  Etta Quill PA-C [facility] [phone]  10/21/2013, 1:54 PM   Assessment/Plan: 68 YO female with posterior neck and circumferential chronic daily HA.  CT head showed a  small hyperdense nodule left vertex 13 x 10 mm compatible with a tiny hemangioma unchanged from MRI in 07/2013. Agree with MRI with and without contrast while in hospital.    Recommend: 1) Treat HA symptomatically 2) Will need to be followed by neurology as out patient for chronic HA and also for yearly imaging of left vertex meningioma.   I personally participate in this patient's evaluation and management, including neurological examination, as well as formulating the above clinical assessment and management recommendations. No further inpatient neurology intervention is indicated if MRI study is unremarkable other than small meningioma as seen before.  Rush Farmer M.D. [facility] [phone]

## 2013-10-22 DIAGNOSIS — R079 Chest pain, unspecified: Secondary | ICD-10-CM | POA: Diagnosis not present

## 2013-10-22 DIAGNOSIS — I6529 Occlusion and stenosis of unspecified carotid artery: Secondary | ICD-10-CM

## 2013-10-22 DIAGNOSIS — I10 Essential (primary) hypertension: Secondary | ICD-10-CM | POA: Diagnosis not present

## 2013-10-22 DIAGNOSIS — D320 Benign neoplasm of cerebral meninges: Secondary | ICD-10-CM

## 2013-10-22 MED ORDER — SIMVASTATIN 10 MG PO TABS: 10.0000 mg | ORAL_TABLET | Freq: Every day | ORAL | Status: DC

## 2013-10-22 MED ORDER — FAMOTIDINE 20 MG PO TABS: 20.0000 mg | ORAL_TABLET | Freq: Two times a day (BID) | ORAL | Status: DC

## 2013-10-22 MED ORDER — CEFUROXIME AXETIL 500 MG PO TABS
500.0000 mg | ORAL_TABLET | Freq: Two times a day (BID) | ORAL | Status: DC
  Administered 2013-10-22: 500 mg via ORAL
  Filled 2013-10-22 (×3): qty 1

## 2013-10-22 MED ORDER — BUTALBITAL-APAP-CAFFEINE 50-325-40 MG PO TABS: 1.0000 | ORAL_TABLET | Freq: Three times a day (TID) | ORAL | Status: DC | PRN

## 2013-10-22 MED ORDER — SIMVASTATIN 10 MG PO TABS
10.0000 mg | ORAL_TABLET | Freq: Every day | ORAL | Status: DC
  Filled 2013-10-22: qty 1

## 2013-10-22 MED ORDER — CEFUROXIME AXETIL 500 MG PO TABS: 500.0000 mg | ORAL_TABLET | Freq: Two times a day (BID) | ORAL | Status: DC

## 2013-10-22 MED ORDER — NITROGLYCERIN 0.4 MG SL SUBL: 0.4000 mg | SUBLINGUAL_TABLET | SUBLINGUAL | Status: DC | PRN

## 2013-10-22 NOTE — Discharge Summary (Signed)
Physician Discharge Summary  Patient ID: [name] MRN: [id number] DOB/AGE: 70-Aug-1947 70 y.o.  Admit date: 10/20/2013 Discharge date: 10/22/2013  Primary Care Physician:  Kathlene November, MD  Discharge Diagnoses:    . atypical Chest pain . Meningioma Chronic headaches hypertension Noncompliance  Consults: Neurology, Dr. Nicole Kindred   Recommendations for Outpatient Follow-up:  Outpatient neurology followup arranged patient will need a yearly imaging of the left vertex meningioma   Allergies:  No Known Allergies   Discharge Medications:   Medication List    STOP taking these medications       phenazopyridine 100 MG tablet  Commonly known as:  PYRIDIUM      TAKE these medications       ACID REDUCER PO  Take by mouth. Takes one by mouth once daily, may take one depending on how bad she feels     amLODipine 5 MG tablet  Commonly known as:  NORVASC  Take 1 tablet (5 mg total) by mouth daily.     bisacodyl 5 MG EC tablet  Commonly known as:  DULCOLAX  Take 5 mg by mouth daily as needed for constipation.     butalbital-acetaminophen-caffeine 50-325-40 MG per tablet  Commonly known as:  FIORICET  Take 1-2 tablets by mouth every 8 (eight) hours as needed for headache.     cefUROXime 500 MG tablet  Commonly known as:  CEFTIN  Take 1 tablet (500 mg total) by mouth 2 (two) times daily with a meal. X 1 week     clonazePAM 2 MG tablet  Commonly known as:  KLONOPIN  1 mg in the a.m and afternoon, 2 mg at night     DULoxetine 30 MG capsule  Commonly known as:  CYMBALTA  Take 30 mg by mouth daily.     famotidine 20 MG tablet  Commonly known as:  PEPCID  Take 1 tablet (20 mg total) by mouth 2 (two) times daily.     magnesium gluconate 500 MG tablet  Commonly known as:  MAGONATE  Take 1,000 mg by mouth daily.     naproxen sodium 220 MG tablet  Commonly known as:  ANAPROX  Take 220 mg by mouth 2 (two) times daily with a meal.     nitroGLYCERIN 0.4 MG SL tablet   Commonly known as:  NITROSTAT  Place 1 tablet (0.4 mg total) under the tongue every 5 (five) minutes as needed for chest pain.     polyethylene glycol packet  Commonly known as:  MIRALAX / GLYCOLAX  Take 17 g by mouth daily.     rOPINIRole 2 MG tablet  Commonly known as:  REQUIP  TAKE 1 TABLET BY MOUTH AT BEDTIME         Brief H and P: For complete details please refer to admission H and P, but in brief 68 year old female with history of hypertension, coronary artery disease, and depression presents with a one to two-week history of intermittent sharp chest pain. The patient states that it usually lasted a few seconds. It occurs mostly at rest. However, on the night of admission, the patient complained of chest pain that radiated to her neck. She stated to the chest pain lasted a few minutes before subsiding. There was associated dizziness with nausea. The patient denied any syncope, shortness of breath, coughing, hemoptysis. The patient denies any fevers, chills, abdominal pain, dysuria, hematuria. She states that she had a heart catheterization back in 2006 and noted some "blockages", but did not need any  stents. The patient has been noncompliant with her antihypertensive medications. She smokes one half pack per day. She has smoked up to one pack per day x50+ years. She denies any alcohol or drug use. Patient states that she takes approximately 28 tablets of naproxen weekly for headache.  In ED, the patient was hemodynamically stable. She was chest pain-free. I-STAT done troponin was negative. EKG was sinus rhythm without any ST-T wave changes. CBC was unremarkable. BMP showed potassium 3.4. Serum creatinine 1.00    Hospital Course:  The patient is 68 year old female who was admitted with atypical chest pain and chronic headaches Atypical chest pain: Suspect anxiety versus GERD. Patient was placed on Pepcid, 3 sets of cardiac enzymes were negative cardiology was consulted. Per cardiology  EKG without any acute changes, doubt cardiac symptoms. 2-D echo showed EF of 55-60%, normal wall motion, no regional wall motion abnormalities  Chronic headaches- CT head showed a meningioma, Small vessel chronic ischemic changes of deep cerebral white matter Neurology was consulted and patient underwent MRI of the brain which showed no evidence of acute intracranial abnormality, moderate chronic small vessel ischemic changes, left frontal convexity meningioma with bilateral maxillary sinusitis. Outpatient neurology followup was arranged with Dr Delice Lesch. Patient was started on Fioricet PRN for chronic headaches.  Sinusitis: - Patient was placed on the Ceftin for 10 days  Day of Discharge BP 161/83  Pulse 66  Temp(Src) 97.7 F (36.5 C) (Oral)  Resp 18  Ht 5\' 8"  (1.727 m)  Wt 88.399 kg (194 lb 14.2 oz)  BMI 29.64 kg/m2  SpO2 96%  Physical Exam: General: Alert and awake oriented x3 not in any acute distress. HEENT: anicteric sclera, pupils reactive to light and accommodation CVS: S1-S2 clear no murmur rubs or gallops Chest: clear to auscultation bilaterally, no wheezing rales or rhonchi Abdomen: soft nontender, nondistended, normal bowel sounds Extremities: no cyanosis, clubbing or edema noted bilaterally Neuro: Cranial nerves II-XII intact, no focal neurological deficits   The results of significant diagnostics from this hospitalization (including imaging, microbiology, ancillary and laboratory) are listed below for reference.    LAB RESULTS: Basic Metabolic Panel:  Recent Labs Lab 10/20/13 1124 10/21/13 0430  NA 140 145  K 4.3 4.2  CL 100 104  CO2 30 31  GLUCOSE 126* 92  BUN 15 16  CREATININE 0.91 0.89  CALCIUM 9.0 8.9  MG 1.9  --    Liver Function Tests: No results found for this basename: AST, ALT, ALKPHOS, BILITOT, PROT, ALBUMIN,  in the last 168 hours No results found for this basename: LIPASE, AMYLASE,  in the last 168 hours No results found for this basename:  AMMONIA,  in the last 168 hours CBC:  Recent Labs Lab 10/20/13 0548 10/20/13 0559  WBC 7.7  --   HGB 11.8* 13.3  HCT 36.1 39.0  MCV 90.9  --   PLT 232  --    Cardiac Enzymes:  Recent Labs Lab 10/20/13 1655 10/20/13 2224  TROPONINI <0.30 <0.30   BNP: No components found with this basename: POCBNP,  CBG: No results found for this basename: GLUCAP,  in the last 168 hours  Significant Diagnostic Studies:  Ct Head Wo Contrast  10/20/2013   CLINICAL DATA:  Frontal headache radiating posteriorly for several months with nausea and posterior neck pain, history hypertension, coronary artery disease  EXAM: CT HEAD WITHOUT CONTRAST  TECHNIQUE: Contiguous axial images were obtained from the base of the skull through the vertex without intravenous contrast.  COMPARISON:  None; correlation made with MRI brain 08/10/2013  FINDINGS: Normal ventricular morphology.  No midline shift or mass effect.  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  Small vessel chronic ischemic changes of deep cerebral white matter.  No intracranial hemorrhage or evidence of acute infarction.  Small hyperdense nodule left vertex 13 x 10 mm compatible with a tiny hemangioma, unchanged from prior MR.  No extra-axial fluid collections.  Mucosal retention cyst left maxillary sinus with small fluid level in right maxillary sinus.  Atherosclerotic calcifications at skullbase.  Bones and sinuses otherwise unremarkable.  IMPRESSION: Small vessel chronic ischemic changes of deep cerebral white matter.  Small left vertex meningioma.  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   Electronically Signed   By: Lavonia Dana M.D.   On: 10/20/2013 15:48   Mr Jeri Cos [id number] Contrast  10/21/2013   CLINICAL DATA:  Headache and meningioma.  EXAM: MRI HEAD WITHOUT AND WITH CONTRAST  TECHNIQUE: Multiplanar, multiecho pulse sequences of the brain and surrounding structures were obtained without and with intravenous contrast.  CONTRAST:  60mL  MULTIHANCE GADOBENATE DIMEGLUMINE 529 MG/ML IV SOLN  COMPARISON:  Head CT 05/09/2014 and brain MRI 08/10/2013  FINDINGS: There is no evidence of acute infarct. Remote microhemorrhage in the inferior right cerebellum is unchanged. There is no evidence of new intracranial hemorrhage. Foci of T2 hyperintensity within the subcortical and deep cerebral white matter do not appear significantly changed and are compatible with moderate chronic small vessel ischemic disease. Mild cerebral atrophy is unchanged. There is no evidence of midline shift or extra-axial fluid collection. Left frontal vertex enhancing extra-axial mass does not appear significantly changed in size, measuring 14 x 11 mm. There is no evidence of significant mass effect.  Major intracranial vascular flow voids are unremarkable. Prior bilateral cataract surgeries noted. There is new fluid in the right maxillary sinus. Mild bilateral maxillary sinus mucosal thickening is present. Mastoid air cells are clear.  IMPRESSION: 1. No evidence of acute intracranial abnormality. 2. Unchanged, moderate chronic small vessel ischemic disease. 3. Unchanged left frontal convexity meningioma. 4. Bilateral maxillary sinus mucosal thickening and small amount of right maxillary sinus fluid. Recommend clinical correlation for signs of acute sinusitis.   Electronically Signed   By: Logan Bores   On: 10/21/2013 20:27   Dg Chest Portable 1 View  10/20/2013   CLINICAL DATA:  Chest pain.  Neck pain.  EXAM: PORTABLE CHEST - 1 VIEW  COMPARISON:  01/15/2013.  FINDINGS: Cardiopericardial silhouette within normal limits. Mediastinal contours normal. Trachea midline. No airspace disease or effusion. Basilar atelectasis is present. Lung volumes are lower than on prior. Aortic arch atherosclerosis.  IMPRESSION: Low volume chest with basilar atelectasis. No acute cardiopulmonary disease.   Electronically Signed   By: Dereck Ligas M.D.   On: 10/20/2013 06:20    2D ECHO: Study  Conclusions  - Left ventricle: The cavity size was normal. Wall thickness was increased in a pattern of mild LVH. Systolic function was normal. The estimated ejection fraction was in the range of 55% to 60%. Wall motion was normal; there were no regional wall motion abnormalities. - Atrial septum: No defect or patent foramen ovale was identified.    Disposition and Follow-up:     Discharge Orders   Future Appointments Provider Department Dept Phone   10/27/2013 2:00 PM Cameron Sprang, MD [facility] Neurology [facility] [phone]   Future Orders Complete By Expires   Diet - low sodium heart healthy  As  directed    Increase activity slowly  As directed        DISPOSITION: home  DIET: heart healthy diet     DISCHARGE FOLLOW-UP Follow-up Information   Follow up with Kathlene November, MD. Schedule an appointment as soon as possible for a visit in 2 weeks. (for hospital follow-up)    Specialty:  Internal Medicine   Contact information:   [phone] [facility] [address] [place] [address] [phone]       Follow up with Cameron Sprang, MD On 10/27/2013. (for hospital follow-up at 1:30PM, with Neurology out-patient )    Specialty:  Neurology   Contact information:   [address] [place] [address] [phone]       Time spent on Discharge: 40 mins  Signed:   RAI,RIPUDEEP M.D. [facility] 10/22/2013, 1:23 PM Pager: [phone]

## 2013-10-22 NOTE — Progress Notes (Signed)
Subjective: A few "twinges" of CP.  Objective: Vital signs in last 24 hours: Temp:  [97.7 F (36.5 C)-98.3 F (36.8 C)] 97.7 F (36.5 C) (02/11 0627) Pulse Rate:  [66-71] 66 (02/11 0627) Resp:  [18] 18 (02/11 0627) BP: (142-161)/(69-83) 161/83 mmHg (02/11 1038) SpO2:  [96 %] 96 % (02/11 0627) Weight:  [194 lb 14.2 oz (88.399 kg)] 194 lb 14.2 oz (88.399 kg) (02/11 0627) Last BM Date: 10/21/13  Intake/Output from previous day: 02/10 0701 - 02/11 0700 In: 840 [P.O.:840] Out: -  Intake/Output this shift:    Medications Current Facility-Administered Medications  Medication Dose Route Frequency Provider Last Rate Last Dose  . acetaminophen (TYLENOL) tablet 650 mg  650 mg Oral Q6H PRN Orson Eva, MD       Or  . acetaminophen (TYLENOL) suppository 650 mg  650 mg Rectal Q6H PRN Orson Eva, MD      . amLODipine (NORVASC) tablet 5 mg  5 mg Oral Daily Orson Eva, MD   5 mg at 10/22/13 1038  . aspirin tablet 325 mg  325 mg Oral Daily Orson Eva, MD   325 mg at 10/22/13 1039  . bisacodyl (DULCOLAX) EC tablet 5 mg  5 mg Oral Daily PRN Orson Eva, MD      . cefUROXime (CEFTIN) tablet 500 mg  500 mg Oral BID WC Ripudeep K Rai, MD      . clonazePAM (KLONOPIN) tablet 1 mg  1 mg Oral Daily Orson Eva, MD   1 mg at 10/22/13 1039  . clonazePAM (KLONOPIN) tablet 2 mg  2 mg Oral QHS Orson Eva, MD   2 mg at 10/21/13 2327  . DULoxetine (CYMBALTA) DR capsule 30 mg  30 mg Oral Daily Orson Eva, MD   30 mg at 10/21/13 1016  . enoxaparin (LOVENOX) injection 40 mg  40 mg Subcutaneous Q24H Orson Eva, MD   40 mg at 10/21/13 1130  . famotidine (PEPCID) tablet 20 mg  20 mg Oral BID Orson Eva, MD   20 mg at 10/21/13 2327  . naproxen (NAPROSYN) tablet 500 mg  500 mg Oral BID PRN Gardiner Barefoot, NP   500 mg at 10/20/13 2131  . nitroGLYCERIN (NITROSTAT) SL tablet 0.4 mg  0.4 mg Sublingual Q5 min PRN Teressa Lower, MD      . ondansetron [facility]) tablet 4 mg  4 mg Oral Q6H PRN Orson Eva, MD       Or  .  ondansetron (ZOFRAN) injection 4 mg  4 mg Intravenous Q6H PRN Orson Eva, MD      . polyethylene glycol (MIRALAX / GLYCOLAX) packet 17 g  17 g Oral Daily Orson Eva, MD      . rOPINIRole (REQUIP) tablet 2 mg  2 mg Oral QHS Orson Eva, MD   2 mg at 10/21/13 2327  . sodium chloride 0.9 % injection 3 mL  3 mL Intravenous Q12H Orson Eva, MD   3 mL at 10/22/13 1043    PE: General appearance: alert, cooperative and no distress Lungs: clear to auscultation bilaterally Heart: regular rate and rhythm, S1, S2 normal, no murmur, click, rub or gallop Extremities: No LEE Pulses: 2+ and symmetric Neurologic: Grossly normal  Lab Results:   Recent Labs  10/20/13 0548 10/20/13 0559  WBC 7.7  --   HGB 11.8* 13.3  HCT 36.1 39.0  PLT 232  --    BMET  Recent Labs  10/20/13 0559 10/20/13 1124 10/21/13 0430  NA 139 140  145  K 3.4* 4.3 4.2  CL 97 100 104  CO2  --  30 31  GLUCOSE 129* 126* 92  BUN 14 15 16   CREATININE 1.00 0.91 0.89  CALCIUM  --  9.0 8.9   PT/INR No results found for this basename: LABPROT, INR,  in the last 72 hours Cholesterol  Recent Labs  10/21/13 0430  CHOL 188   Cardiac Enzymes No components found with this basename: TROPONIN,  CKMB,   Studies/Results: @RISRSLT2 @   Assessment/Plan   Active Problems:   Chest pain   Meningioma  Plan:  Negative NST.  Ruled out for MI.  Follow up with Dr. Johnsie Cancel.  BP elevated.   Consider increasing amlodipine.   LOS: 2 days    HAGER, BRYAN 10/22/2013 11:10 AM   Agree with note written by Luisa Dago [facility]  Doubt cardiac Sx. Enz neg. EKG w/o acute changes. GXT non ischemic. OK for D/C from our point of view. ROV with Dr. Johnsie Cancel.  Lorretta Harp 10/22/2013 11:21 AM

## 2013-10-22 NOTE — Discharge Instructions (Signed)
Cardiac Diet °This diet can help prevent heart disease and stroke. Many factors influence your heart health, including eating and exercise habits. Coronary risk rises a lot with abnormal blood fat (lipid) levels. Cardiac meal planning includes limiting unhealthy fats, increasing healthy fats, and making other small dietary changes. General guidelines are as follows: °· Adjust calorie intake to reach and maintain desirable body weight. °· Limit total fat intake to less than 30% of total calories. Saturated fat should be less than 7% of calories. °· Saturated fats are found in animal products and in some vegetable products. Saturated vegetable fats are found in coconut oil, cocoa butter, palm oil, and palm kernel oil. Read labels carefully to avoid these products as much as possible. Use butter in moderation. Choose tub margarines and oils that have 2 grams of fat or less. Good cooking oils are canola and olive oils. °· Practice low-fat cooking techniques. Do not fry food. Instead, broil, bake, boil, steam, grill, roast on a rack, stir-fry, or microwave it. Other fat reducing suggestions include: °· Remove the skin from poultry. °· Remove all visible fat from meats. °· Skim the fat off stews, soups, and gravies before serving them. °· Steam vegetables in water or broth instead of sautéing them in fat. °· Avoid foods with trans fat (or hydrogenated oils), such as commercially fried foods and commercially baked goods. Commercial shortening and deep-frying fats will contain trans fat. °· Increase intake of fruits, vegetables, whole grains, and legumes to replace foods high in fat. °· Increase consumption of nuts, legumes, and seeds to at least 4 servings weekly. One serving of a legume equals ½ cup, and 1 serving of nuts or seeds equals ¼ cup. °· Choose whole grains more often. Have 3 servings per day (a serving is 1 ounce [oz]). °· Eat 4 to 5 servings of vegetables per day. A serving of vegetables is 1 cup of raw leafy  vegetables; ½ cup of raw or cooked cut-up vegetables; ½ cup of vegetable juice. °· Eat 4 to 5 servings of fruit per day. A serving of fruit is 1 medium whole fruit; ¼ cup of dried fruit; ½ cup of fresh, frozen, or canned fruit; ½ cup of 100% fruit juice. °· Increase your intake of dietary fiber to 20 to 30 grams per day. Insoluble fiber may help lower your risk of heart disease and may help curb your appetite.  °Soluble fiber binds cholesterol to be removed from the blood. Foods high in soluble fiber are dried beans, citrus fruits, oats, apples, bananas, broccoli, Brussels sprouts, and eggplant. °· Try to include foods fortified with plant sterols or stanols, such as yogurt, breads, juices, or margarines. Choose several fortified foods to achieve a daily intake of 2 to 3 grams of plant sterols or stanols. °· Foods with omega-3 fats can help reduce your risk of heart disease. Aim to have a 3.5 oz portion of fatty fish twice per week, such as salmon, mackerel, albacore tuna, sardines, lake trout, or herring. If you wish to take a fish oil supplement, choose one that contains 1 gram of both DHA and EPA. °· Limit processed meats to 2 servings (3 oz portion) weekly. °· Limit the sodium in your diet to 1500 milligrams (mg) per day. If you have high blood pressure, talk to a registered dietitian about a DASH (Dietary Approaches to Stop Hypertension) eating plan. °· Limit sweets and beverages with added sugar, such as soda, to no more than 5 servings per week. One   serving is:   °· 1 tablespoon sugar. °· 1 tablespoon jelly or jam. °· ½ cup sorbet. °· 1 cup lemonade. °· ½ cup regular soda. °CHOOSING FOODS °Starches °· Allowed: Breads: All kinds (wheat, rye, raisin, white, oatmeal, Italian, French, and English muffin bread). Low-fat rolls: English muffins, frankfurter and hamburger buns, bagels, pita bread, tortillas (not fried). Pancakes, waffles, biscuits, and muffins made with recommended oil. °· Avoid: Products made with  saturated or trans fats, oils, or whole milk products. Butter rolls, cheese breads, croissants. Commercial doughnuts, muffins, sweet rolls, biscuits, waffles, pancakes, store-bought mixes. °Crackers °· Allowed: Low-fat crackers and snacks: Animal, graham, rye, saltine (with recommended oil, no lard), oyster, and matzo crackers. Bread sticks, melba toast, rusks, flatbread, pretzels, and light popcorn. °· Avoid: High-fat crackers: cheese crackers, butter crackers, and those made with coconut, palm oil, or trans fat (hydrogenated oils). Buttered popcorn. °Cereals °· Allowed: Hot or cold whole-grain cereals. °· Avoid: Cereals containing coconut, hydrogenated vegetable fat, or animal fat. °Potatoes / Pasta / Rice °· Allowed: All kinds of potatoes, rice, and pasta (such as macaroni, spaghetti, and noodles). °· Avoid: Pasta or rice prepared with cream sauce or high-fat cheese. Chow mein noodles, French fries. °Vegetables °· Allowed: All vegetables and vegetable juices. °· Avoid: Fried vegetables. Vegetables in cream, butter, or high-fat cheese sauces. Limit coconut. Fruit in cream or custard. °Protein °· Allowed: Limit your intake of meat, seafood, and poultry to no more than 6 oz (cooked weight) per day. All lean, well-trimmed beef, veal, pork, and lamb. All chicken and turkey without skin. All fish and shellfish. Wild game: wild duck, rabbit, pheasant, and venison. Egg whites or low-cholesterol egg substitutes may be used as desired. Meatless dishes: recipes with dried beans, peas, lentils, and tofu (soybean curd). Seeds and nuts: all seeds and most nuts. °· Avoid: Prime grade and other heavily marbled and fatty meats, such as short ribs, spare ribs, rib eye roast or steak, frankfurters, sausage, bacon, and high-fat luncheon meats, mutton. Caviar. Commercially fried fish. Domestic duck, goose, venison sausage. Organ meats: liver, gizzard, heart, chitterlings, brains, kidney, sweetbreads. °Dairy °· Allowed: Low-fat  cheeses: nonfat or low-fat cottage cheese (1% or 2% fat), cheeses made with part skim milk, such as mozzarella, farmers, string, or ricotta. (Cheeses should be labeled no more than 2 to 6 grams fat per oz.). Skim (or 1%) milk: liquid, powdered, or evaporated. Buttermilk made with low-fat milk. Drinks made with skim or low-fat milk or cocoa. Chocolate milk or cocoa made with skim or low-fat (1%) milk. Nonfat or low-fat yogurt. °· Avoid: Whole milk cheeses, including colby, cheddar, muenster, Monterey Jack, Havarti, Brie, Camembert, American, Swiss, and blue. Creamed cottage cheese, cream cheese. Whole milk and whole milk products, including buttermilk or yogurt made from whole milk, drinks made from whole milk. Condensed milk, evaporated whole milk, and 2% milk. °Soups and Combination Foods °· Allowed: Low-fat low-sodium soups: broth, dehydrated soups, homemade broth, soups with the fat removed, homemade cream soups made with skim or low-fat milk. Low-fat spaghetti, lasagna, chili, and Spanish rice if low-fat ingredients and low-fat cooking techniques are used. °· Avoid: Cream soups made with whole milk, cream, or high-fat cheese. All other soups. °Desserts and Sweets °· Allowed: Sherbet, fruit ices, gelatins, meringues, and angel food cake. Homemade desserts with recommended fats, oils, and milk products. Jam, jelly, honey, marmalade, sugars, and syrups. Pure sugar candy, such as gum drops, hard candy, jelly beans, marshmallows, mints, and small amounts of dark chocolate. °· Avoid: Commercially prepared   cakes, pies, cookies, frosting, pudding, or mixes for these products. Desserts containing whole milk products, chocolate, coconut, lard, palm oil, or palm kernel oil. Ice cream or ice cream drinks. Candy that contains chocolate, coconut, butter, hydrogenated fat, or unknown ingredients. Buttered syrups. °Fats and Oils °· Allowed: Vegetable oils: safflower, sunflower, corn, soybean, cottonseed, sesame, canola, olive,  or peanut. Non-hydrogenated margarines. Salad dressing or mayonnaise: homemade or commercial, made with a recommended oil. Low or nonfat salad dressing or mayonnaise. °· Limit added fats and oils to 6 to 8 tsp per day (includes fats used in cooking, baking, salads, and spreads on bread). Remember to count the "hidden fats" in foods. °· Avoid: Solid fats and shortenings: butter, lard, salt pork, bacon drippings. Gravy containing meat fat, shortening, or suet. Cocoa butter, coconut. Coconut oil, palm oil, palm kernel oil, or hydrogenated oils: these ingredients are often used in bakery products, nondairy creamers, whipped toppings, candy, and commercially fried foods. Read labels carefully. Salad dressings made of unknown oils, sour cream, or cheese, such as blue cheese and Roquefort. Cream, all kinds: half-and-half, light, heavy, or whipping. Sour cream or cream cheese (even if "light" or low-fat). Nondairy cream substitutes: coffee creamers and sour cream substitutes made with palm, palm kernel, hydrogenated oils, or coconut oil. °Beverages °· Allowed: Coffee (regular or decaffeinated), tea. Diet carbonated beverages, mineral water. Alcohol: Check with your caregiver. Moderation is recommended. °· Avoid: Whole milk, regular sodas, and juice drinks with added sugar. °Condiments °· Allowed: All seasonings and condiments. Cocoa powder. "Cream" sauces made with recommended ingredients. °· Avoid: Carob powder made with hydrogenated fats. °SAMPLE MENU °Breakfast °· ½ cup orange juice °· ½ cup oatmeal °· 1 slice toast °· 1 tsp margarine °· 1 cup skim milk °Lunch °· Turkey sandwich with 2 oz turkey, 2 slices bread °· Lettuce and tomato slices °· Fresh fruit °· Carrot sticks °· Coffee or tea °Snack °· Fresh fruit or low-fat crackers °Dinner °· 3 oz lean ground beef °· 1 baked potato °· 1 tsp margarine °· ½ cup asparagus °· Lettuce salad °· 1 tbs non-creamy dressing °· ½ cup peach slices °· 1 cup skim milk °Document Released:  06/06/2008 Document Revised: 02/27/2012 Document Reviewed: 11/21/2011 °[employer]® Patient Information ©2014 [employer]. ° °

## 2013-10-27 ENCOUNTER — Ambulatory Visit: Payer: Medicare Other | Admitting: Neurology

## 2013-11-04 DIAGNOSIS — H35379 Puckering of macula, unspecified eye: Secondary | ICD-10-CM | POA: Diagnosis not present

## 2013-11-04 DIAGNOSIS — H43819 Vitreous degeneration, unspecified eye: Secondary | ICD-10-CM | POA: Diagnosis not present

## 2013-11-04 DIAGNOSIS — H02839 Dermatochalasis of unspecified eye, unspecified eyelid: Secondary | ICD-10-CM | POA: Diagnosis not present

## 2013-11-04 DIAGNOSIS — H35349 Macular cyst, hole, or pseudohole, unspecified eye: Secondary | ICD-10-CM | POA: Diagnosis not present

## 2013-11-18 DIAGNOSIS — Z124 Encounter for screening for malignant neoplasm of cervix: Secondary | ICD-10-CM | POA: Diagnosis not present

## 2013-11-18 DIAGNOSIS — N898 Other specified noninflammatory disorders of vagina: Secondary | ICD-10-CM | POA: Diagnosis not present

## 2013-11-18 DIAGNOSIS — L293 Anogenital pruritus, unspecified: Secondary | ICD-10-CM | POA: Diagnosis not present

## 2013-11-18 DIAGNOSIS — R32 Unspecified urinary incontinence: Secondary | ICD-10-CM | POA: Diagnosis not present

## 2013-11-18 DIAGNOSIS — Z113 Encounter for screening for infections with a predominantly sexual mode of transmission: Secondary | ICD-10-CM | POA: Diagnosis not present

## 2013-11-19 DIAGNOSIS — Z01419 Encounter for gynecological examination (general) (routine) without abnormal findings: Secondary | ICD-10-CM | POA: Diagnosis not present

## 2013-11-27 DIAGNOSIS — H35349 Macular cyst, hole, or pseudohole, unspecified eye: Secondary | ICD-10-CM | POA: Diagnosis not present

## 2013-11-27 DIAGNOSIS — H35379 Puckering of macula, unspecified eye: Secondary | ICD-10-CM | POA: Diagnosis not present

## 2013-11-27 DIAGNOSIS — H43819 Vitreous degeneration, unspecified eye: Secondary | ICD-10-CM | POA: Diagnosis not present

## 2013-11-27 DIAGNOSIS — H02839 Dermatochalasis of unspecified eye, unspecified eyelid: Secondary | ICD-10-CM | POA: Diagnosis not present

## 2013-12-08 DIAGNOSIS — D332 Benign neoplasm of brain, unspecified: Secondary | ICD-10-CM | POA: Diagnosis not present

## 2013-12-08 DIAGNOSIS — I6529 Occlusion and stenosis of unspecified carotid artery: Secondary | ICD-10-CM | POA: Diagnosis not present

## 2013-12-08 DIAGNOSIS — R51 Headache: Secondary | ICD-10-CM | POA: Diagnosis not present

## 2013-12-08 DIAGNOSIS — R413 Other amnesia: Secondary | ICD-10-CM | POA: Diagnosis not present

## 2013-12-10 DIAGNOSIS — H35349 Macular cyst, hole, or pseudohole, unspecified eye: Secondary | ICD-10-CM | POA: Diagnosis not present

## 2013-12-10 DIAGNOSIS — H02839 Dermatochalasis of unspecified eye, unspecified eyelid: Secondary | ICD-10-CM | POA: Diagnosis not present

## 2013-12-10 DIAGNOSIS — Z01818 Encounter for other preprocedural examination: Secondary | ICD-10-CM | POA: Diagnosis not present

## 2013-12-10 DIAGNOSIS — H35379 Puckering of macula, unspecified eye: Secondary | ICD-10-CM | POA: Diagnosis not present

## 2013-12-11 ENCOUNTER — Other Ambulatory Visit: Payer: Self-pay | Admitting: Neurology

## 2013-12-11 DIAGNOSIS — I639 Cerebral infarction, unspecified: Secondary | ICD-10-CM

## 2013-12-16 DIAGNOSIS — H02839 Dermatochalasis of unspecified eye, unspecified eyelid: Secondary | ICD-10-CM | POA: Diagnosis not present

## 2013-12-23 DIAGNOSIS — R413 Other amnesia: Secondary | ICD-10-CM | POA: Diagnosis not present

## 2013-12-23 DIAGNOSIS — R51 Headache: Secondary | ICD-10-CM | POA: Diagnosis not present

## 2013-12-24 ENCOUNTER — Ambulatory Visit
Admission: RE | Admit: 2013-12-24 | Discharge: 2013-12-24 | Disposition: A | Discharge status: Home / Self Care | Payer: Medicare Other | Source: Ambulatory Visit | Attending: Neurology | Admitting: Neurology

## 2013-12-24 DIAGNOSIS — I639 Cerebral infarction, unspecified: Secondary | ICD-10-CM

## 2013-12-24 DIAGNOSIS — D320 Benign neoplasm of cerebral meninges: Secondary | ICD-10-CM | POA: Diagnosis not present

## 2013-12-24 DIAGNOSIS — I6350 Cerebral infarction due to unspecified occlusion or stenosis of unspecified cerebral artery: Secondary | ICD-10-CM | POA: Diagnosis not present

## 2013-12-24 MED ORDER — IOHEXOL 350 MG/ML SOLN
100.0000 mL | Freq: Once | INTRAVENOUS | Status: AC | PRN
  Administered 2013-12-24: 100 mL via INTRAVENOUS

## 2013-12-25 DIAGNOSIS — IMO0002 Reserved for concepts with insufficient information to code with codable children: Secondary | ICD-10-CM | POA: Diagnosis not present

## 2014-01-02 ENCOUNTER — Telehealth: Payer: Self-pay | Admitting: Internal Medicine

## 2014-01-02 NOTE — Telephone Encounter (Signed)
Note from Dr. Catalina Gravel, neurology reviewed. She  was seen with a number of issues, they are recommending a CT angiogram of the head and neck, BP was slightly elevated, sedimentation rate was normal, TSH was 5.2 which is a slightly high. RPR negative, B12 normal. Plan: Advise patient to schedule follow up to see about her blood pressure and recheck thyroid tests. Fax to neurology echocardiogram and carotid ultrasound from February 2015.

## 2014-01-05 NOTE — Telephone Encounter (Signed)
[facility]

## 2014-01-14 DIAGNOSIS — D332 Benign neoplasm of brain, unspecified: Secondary | ICD-10-CM | POA: Diagnosis not present

## 2014-01-14 DIAGNOSIS — R269 Unspecified abnormalities of gait and mobility: Secondary | ICD-10-CM | POA: Diagnosis not present

## 2014-01-14 DIAGNOSIS — R413 Other amnesia: Secondary | ICD-10-CM | POA: Diagnosis not present

## 2014-01-14 DIAGNOSIS — I6350 Cerebral infarction due to unspecified occlusion or stenosis of unspecified cerebral artery: Secondary | ICD-10-CM | POA: Diagnosis not present

## 2014-01-16 ENCOUNTER — Encounter: Payer: Self-pay | Admitting: Internal Medicine

## 2014-01-16 ENCOUNTER — Telehealth: Payer: Self-pay | Admitting: Internal Medicine

## 2014-01-16 ENCOUNTER — Ambulatory Visit (INDEPENDENT_AMBULATORY_CARE_PROVIDER_SITE_OTHER): Payer: Medicare Other | Admitting: Internal Medicine

## 2014-01-16 VITALS — BP 129/75 | HR 78 | Temp 98.4°F | Wt 196.2 lb

## 2014-01-16 DIAGNOSIS — E039 Hypothyroidism, unspecified: Secondary | ICD-10-CM

## 2014-01-16 DIAGNOSIS — D329 Benign neoplasm of meninges, unspecified: Secondary | ICD-10-CM

## 2014-01-16 DIAGNOSIS — I6529 Occlusion and stenosis of unspecified carotid artery: Secondary | ICD-10-CM

## 2014-01-16 DIAGNOSIS — D320 Benign neoplasm of cerebral meninges: Secondary | ICD-10-CM | POA: Diagnosis not present

## 2014-01-16 DIAGNOSIS — R51 Headache: Secondary | ICD-10-CM

## 2014-01-16 DIAGNOSIS — R42 Dizziness and giddiness: Secondary | ICD-10-CM

## 2014-01-16 DIAGNOSIS — G3184 Mild cognitive impairment, so stated: Secondary | ICD-10-CM

## 2014-01-16 DIAGNOSIS — R079 Chest pain, unspecified: Secondary | ICD-10-CM

## 2014-01-16 HISTORY — DX: Hypothyroidism, unspecified: E03.9

## 2014-01-16 LAB — T4, FREE: FREE T4: 0.82 ng/dL (ref 0.60–1.60)

## 2014-01-16 LAB — T3, FREE: T3, Free: 2.7 pg/mL (ref 2.3–4.2)

## 2014-01-16 LAB — TSH: TSH: 1.21 u[IU]/mL (ref 0.35–4.50)

## 2014-01-16 NOTE — Assessment & Plan Note (Signed)
chronic issue, was seen by neurology as an inpatient 10-2013, prescribe Fioricet, patient self discontinue it.

## 2014-01-16 NOTE — Assessment & Plan Note (Signed)
Saw Dr.Lewit  Neurology 11/13/2013 complaining of forgetfulness, ? mild cognitive impairment

## 2014-01-16 NOTE — Patient Instructions (Signed)
Get your blood work before you leave   Next visit is for a physical exam by 06-2014 ,  fasting Please make an appointment    

## 2014-01-16 NOTE — Assessment & Plan Note (Signed)
Reportedly the issue was discussed with neurology 11/2013, patient was recommended to physical therapy

## 2014-01-16 NOTE — Assessment & Plan Note (Signed)
Since the last time she was here, had 2 carotid ultrasounds, last one was 10-2013:1-39% bilaterally.

## 2014-01-16 NOTE — Assessment & Plan Note (Addendum)
TSH @ neurology was ~ 5.0, plan to check a TSH, free T3, free T4. She did complain of gaining weight. Probably has hypothyroidism. Will treat based on results.

## 2014-01-16 NOTE — Assessment & Plan Note (Addendum)
The last time she was here in the 2014 I recommended a stress test, that never happened. She was subsequently admitted  10-2013 with chest pain, was seen by cardiology, CP felt to be atypical, no stress   was persued, echocardiogram was essentially normal. Currently asymptomatic

## 2014-01-16 NOTE — Telephone Encounter (Signed)
Caller name: Zilda Relation to pt: self  Call back number: [phone] Pharmacy: [id number] - [place], [address] AT [address]   Reason for call:  Pt was seen on 01/16/14, but forgot to ask for something for nausea.  Pt would like to have something for Nausea.  Please contact pt if able to get something prescribed.  Thank you.

## 2014-01-16 NOTE — Assessment & Plan Note (Addendum)
MRI 10/2013 during a hospital admission show a meningioma, she recently saw neurology Dr Catalina Gravel, it was felt to be asymptomatic

## 2014-01-16 NOTE — Progress Notes (Signed)
Subjective:    Patient ID: [name], female    DOB: 1946/07/24, 68 y.o.   MRN: [id number]  DOS:  01/16/2014 Type of  visit: ROV Since the last time she was here she was admitted to the hospital, chart reviewed, excerpts: Admit date: 10/20/2013  Discharge date: 10/22/2013  Consults: Neurology, Dr. Nicole Kindred  --Atypical chest pain: Suspect anxiety versus GERD. Patient was placed on Pepcid, 3 sets of cardiac enzymes were negative cardiology was consulted. Per cardiology EKG without any acute changes, doubt cardiac symptoms. 2-D echo showed EF of 55-60%, normal wall motion, no regional wall motion abnormalities  --Chronic headaches- CT head showed a meningioma, Small vessel chronic ischemic changes of deep cerebral white matter  Neurology was consulted and patient underwent MRI of the brain which showed no evidence of acute intracranial abnormality, moderate chronic small vessel ischemic changes, left frontal convexity meningioma with bilateral maxillary sinusitis. Outpatient neurology followup was arranged with Dr Delice Lesch. Patient was started on Fioricet PRN for chronic headaches.   Was recently seen by neurology, TSH was elevated . See a/p History of chest pain, no recent symptoms, has not taken nitroglycerin Hyperlipidemia, good medication compliance.  ROS Denies vomiting, diarrhea, blood in the stool or abdominal pain. States that she has frequently nausea in the morning "is going on all my life". She has a history of depression, symptoms are not as well controlled as she likes , she is followed by psychiatry.  Past Medical History  Diagnosis Date  . Hypertension   . Constipation     chronic;severe  . CAD (coronary artery disease)     intra and extracranial vascular dx per MRI 4/11, neurology rec strict CVRF control  . Depression   . Psoriasis     sees derm  . EKG abnormalities     changes, stress test neg (false EKG changes)  . Colonic inertia   . Bipolar disorder   . GERD  (gastroesophageal reflux disease)   . Gastritis   . Duodenitis     Past Surgical History  Procedure Laterality Date  . Tubal ligation    . Arthroscopy  04/2010    Right knee    History   Social History  . Marital Status: Married    Spouse Name: N/A    Number of Children: 1  . Years of Education: N/A   Occupational History  . retired    Social History Main Topics  . Smoking status: Current Every Day Smoker -- 1.00 packs/day    Types: Cigarettes  . Smokeless tobacco: Never Used  . Alcohol Use: 1.2 oz/week    1 Cans of beer, 1 Glasses of wine per week     Comment: yes on occassion  . Drug Use: No  . Sexual Activity: Not on file   Other Topics Concern  . Not on file   Social History Narrative   Brother in law Mr Rexford Maus (one of my patients)   Lives w/ husband            Medication List       This list is accurate as of: 01/16/14  1:30 PM.  Always use your most recent med list.               ACID REDUCER PO  Take by mouth. Takes one by mouth once daily, may take one depending on how bad she feels     bisacodyl 5 MG EC tablet  Commonly known as:  DULCOLAX  Take 5 mg by mouth daily as needed for constipation.     butalbital-acetaminophen-caffeine 50-325-40 MG per tablet  Commonly known as:  FIORICET  Take 1-2 tablets by mouth every 8 (eight) hours as needed for headache.     clonazePAM 2 MG tablet  Commonly known as:  KLONOPIN  1 mg in the a.m and afternoon, 2 mg at night     DULoxetine 30 MG capsule  Commonly known as:  CYMBALTA  Take 30 mg by mouth daily.     famotidine 20 MG tablet  Commonly known as:  PEPCID  Take 1 tablet (20 mg total) by mouth 2 (two) times daily.     magnesium gluconate 500 MG tablet  Commonly known as:  MAGONATE  Take 1,000 mg by mouth daily.     naproxen sodium 220 MG tablet  Commonly known as:  ANAPROX  Take 220 mg by mouth 2 (two) times daily with a meal.     nitroGLYCERIN 0.4 MG SL tablet  Commonly known as:   NITROSTAT  Place 1 tablet (0.4 mg total) under the tongue every 5 (five) minutes as needed for chest pain.     polyethylene glycol packet  Commonly known as:  MIRALAX / GLYCOLAX  Take 17 g by mouth daily.     rOPINIRole 2 MG tablet  Commonly known as:  REQUIP  TAKE 1 TABLET BY MOUTH AT BEDTIME     simvastatin 10 MG tablet  Commonly known as:  ZOCOR  Take 1 tablet (10 mg total) by mouth at bedtime.           Objective:   Physical Exam BP 129/75  Pulse 78  Temp(Src) 98.4 F (36.9 C) (Oral)  Wt 196 lb 3.2 oz (88.996 kg)  SpO2 95% General -- alert, well-developed, NAD.  Neck --no thyromegaly  HEENT-- Not pale. Lungs -- normal respiratory effort, no intercostal retractions, no accessory muscle use, and normal breath sounds.  Heart-- normal rate, regular rhythm, no murmur.  Abdomen-- Not distended, good bowel sounds,soft, non-tender.  Extremities-- no pretibial edema bilaterally  Neurologic--  alert & oriented X3. Speech normal, gait normal, strength normal in all extremities.      Assessment & Plan:

## 2014-01-17 ENCOUNTER — Telehealth: Payer: Self-pay | Admitting: Internal Medicine

## 2014-01-17 NOTE — Telephone Encounter (Signed)
Relevant patient education assigned to patient using Emmi. ° °

## 2014-01-19 MED ORDER — ONDANSETRON HCL 4 MG PO TABS: 4.0000 mg | ORAL_TABLET | Freq: Three times a day (TID) | ORAL | Status: DC | PRN

## 2014-01-19 NOTE — Telephone Encounter (Signed)
Patient has chronic nausea, advise her  I sent a prescription for zofran,If she's not better she needs to contact GI

## 2014-01-19 NOTE — Telephone Encounter (Signed)
Informed the pt of Dr. Ethel Rana note and recommendation below.  Pt understood and agreed.  While on the phone the pt asked about her recent lab results.  Informed the pt of recent lab results.  Pt understood and agreed.//AB/CMA

## 2014-02-25 ENCOUNTER — Telehealth: Payer: Self-pay | Admitting: Internal Medicine

## 2014-02-25 NOTE — Telephone Encounter (Signed)
Pt scheduled  

## 2014-02-25 NOTE — Telephone Encounter (Signed)
Patient reports she is feeling worse with fatigue, nausea, liquid stools (see answer to a survey). Advise pt: Recommend to have another office visit for reassessment of her symptoms

## 2014-03-02 ENCOUNTER — Ambulatory Visit: Payer: Medicare Other | Admitting: Internal Medicine

## 2014-03-02 ENCOUNTER — Encounter: Payer: Self-pay | Admitting: Internal Medicine

## 2014-03-04 ENCOUNTER — Encounter: Payer: Self-pay | Admitting: Internal Medicine

## 2014-03-04 ENCOUNTER — Ambulatory Visit (INDEPENDENT_AMBULATORY_CARE_PROVIDER_SITE_OTHER): Payer: Medicare Other | Admitting: Internal Medicine

## 2014-03-04 VITALS — BP 161/88 | HR 83 | Temp 98.0°F | Wt 193.0 lb

## 2014-03-04 DIAGNOSIS — R42 Dizziness and giddiness: Secondary | ICD-10-CM | POA: Diagnosis not present

## 2014-03-04 DIAGNOSIS — G3184 Mild cognitive impairment, so stated: Secondary | ICD-10-CM | POA: Diagnosis not present

## 2014-03-04 DIAGNOSIS — R5383 Other fatigue: Secondary | ICD-10-CM | POA: Diagnosis not present

## 2014-03-04 DIAGNOSIS — F3289 Other specified depressive episodes: Secondary | ICD-10-CM

## 2014-03-04 DIAGNOSIS — I6529 Occlusion and stenosis of unspecified carotid artery: Secondary | ICD-10-CM

## 2014-03-04 DIAGNOSIS — F329 Major depressive disorder, single episode, unspecified: Secondary | ICD-10-CM | POA: Diagnosis not present

## 2014-03-04 DIAGNOSIS — R5381 Other malaise: Secondary | ICD-10-CM

## 2014-03-04 DIAGNOSIS — F32A Depression, unspecified: Secondary | ICD-10-CM

## 2014-03-04 MED ORDER — HYDROCORTISONE 2.5 % EX CREA: TOPICAL_CREAM | Freq: Two times a day (BID) | CUTANEOUS | Status: DC

## 2014-03-04 NOTE — Progress Notes (Signed)
Pre visit review using our clinic review tool, if applicable. No additional management support is needed unless otherwise documented below in the visit note. 

## 2014-03-04 NOTE — Patient Instructions (Signed)
Get your blood work before you leave  X54, folic acid, vitamin D -----------DX fatigue  See your psychiatrist as soon as possible  Also considered see a counselor  If suicidal ideas, call : [facility] [phone]  or [phone]
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
## 2014-03-05 LAB — VITAMIN D 25 HYDROXY (VIT D DEFICIENCY, FRACTURES): VITD: 31.01 ng/mL

## 2014-03-05 LAB — FOLATE: Folate: 7.9 ng/mL (ref >5.9)

## 2014-03-05 LAB — VITAMIN B12: Vitamin B-12: 247 pg/mL (ref 211–911)

## 2014-03-05 NOTE — Assessment & Plan Note (Signed)
See comments under " depression" Will refer patient to physical therapy for vestibular rehabilitation

## 2014-03-05 NOTE — Assessment & Plan Note (Signed)
See comments under " depression"

## 2014-03-05 NOTE — Assessment & Plan Note (Signed)
Continue with a number of symptoms including cognition impairment, dizziness, worsening depression. After  the chart was reviewed , I assured the patient that her recent blood work and imaging studies don't show anything serious or  something that would account for her symptoms. I think many of her symptoms are depression related Consequently I encouraged her to talk with her psychiatrist (sees a PA) and seriously consider to see a counselor. To call a hot line  immediately if any suicidal thoughts resurface. She is considering change her psychiatrist provider ---contact numbers were given to the patient. Will check her vitamin levels for completeness She wonders if some of the anxiety medications are causing her symptoms and that is definitely possible and again encouraged her to with her psychiatrist

## 2014-03-09 ENCOUNTER — Encounter: Payer: Self-pay | Admitting: Internal Medicine

## 2014-03-16 DIAGNOSIS — F331 Major depressive disorder, recurrent, moderate: Secondary | ICD-10-CM | POA: Diagnosis not present

## 2014-03-23 ENCOUNTER — Ambulatory Visit: Payer: Medicare Other | Attending: Internal Medicine | Admitting: Rehabilitation

## 2014-03-23 DIAGNOSIS — R42 Dizziness and giddiness: Secondary | ICD-10-CM | POA: Diagnosis not present

## 2014-03-23 DIAGNOSIS — Z8673 Personal history of transient ischemic attack (TIA), and cerebral infarction without residual deficits: Secondary | ICD-10-CM | POA: Insufficient documentation

## 2014-03-23 DIAGNOSIS — I10 Essential (primary) hypertension: Secondary | ICD-10-CM | POA: Insufficient documentation

## 2014-03-23 DIAGNOSIS — IMO0001 Reserved for inherently not codable concepts without codable children: Secondary | ICD-10-CM | POA: Insufficient documentation

## 2014-03-23 DIAGNOSIS — R262 Difficulty in walking, not elsewhere classified: Secondary | ICD-10-CM | POA: Insufficient documentation

## 2014-03-27 ENCOUNTER — Telehealth: Payer: Self-pay | Admitting: *Deleted

## 2014-03-27 NOTE — Telephone Encounter (Signed)
Received Assessment and Plan for Treatment forms via fax from [place].  Placed in folder for Dr. Larose Kells to review and sign.//AB/CMA

## 2014-03-27 NOTE — Telephone Encounter (Signed)
Received signed Assessment and Plan of Treatment form from Dr. Larose Kells.   All forms faxed to [name] at [phone]).  Confirmation received.//AB/CMA

## 2014-03-30 ENCOUNTER — Ambulatory Visit: Payer: Medicare Other | Admitting: Rehabilitation

## 2014-03-31 DIAGNOSIS — F331 Major depressive disorder, recurrent, moderate: Secondary | ICD-10-CM | POA: Diagnosis not present

## 2014-04-02 ENCOUNTER — Ambulatory Visit: Payer: Medicare Other | Admitting: Rehabilitation

## 2014-04-06 ENCOUNTER — Ambulatory Visit: Payer: Medicare Other | Admitting: Rehabilitation

## 2014-04-09 ENCOUNTER — Ambulatory Visit: Payer: Medicare Other | Admitting: Rehabilitation

## 2014-04-13 ENCOUNTER — Ambulatory Visit: Payer: Medicare Other | Admitting: Rehabilitation

## 2014-04-14 DIAGNOSIS — F331 Major depressive disorder, recurrent, moderate: Secondary | ICD-10-CM | POA: Diagnosis not present

## 2014-04-16 ENCOUNTER — Ambulatory Visit: Payer: Medicare Other | Admitting: Rehabilitation

## 2014-04-20 ENCOUNTER — Ambulatory Visit: Payer: Medicare Other | Admitting: Rehabilitation

## 2014-04-23 ENCOUNTER — Ambulatory Visit: Payer: Medicare Other | Admitting: Rehabilitation

## 2014-05-21 DIAGNOSIS — F331 Major depressive disorder, recurrent, moderate: Secondary | ICD-10-CM | POA: Diagnosis not present

## 2014-06-01 DIAGNOSIS — L821 Other seborrheic keratosis: Secondary | ICD-10-CM | POA: Diagnosis not present

## 2014-06-01 DIAGNOSIS — L739 Follicular disorder, unspecified: Secondary | ICD-10-CM | POA: Diagnosis not present

## 2014-06-01 DIAGNOSIS — L408 Other psoriasis: Secondary | ICD-10-CM | POA: Diagnosis not present

## 2014-06-02 DIAGNOSIS — F331 Major depressive disorder, recurrent, moderate: Secondary | ICD-10-CM | POA: Diagnosis not present

## 2014-06-22 ENCOUNTER — Encounter: Payer: Self-pay | Admitting: Internal Medicine

## 2014-06-30 DIAGNOSIS — F331 Major depressive disorder, recurrent, moderate: Secondary | ICD-10-CM | POA: Diagnosis not present

## 2014-07-17 DIAGNOSIS — F331 Major depressive disorder, recurrent, moderate: Secondary | ICD-10-CM | POA: Diagnosis not present

## 2014-07-20 DIAGNOSIS — F331 Major depressive disorder, recurrent, moderate: Secondary | ICD-10-CM | POA: Diagnosis not present

## 2014-07-31 DIAGNOSIS — F331 Major depressive disorder, recurrent, moderate: Secondary | ICD-10-CM | POA: Diagnosis not present

## 2014-08-10 DIAGNOSIS — F331 Major depressive disorder, recurrent, moderate: Secondary | ICD-10-CM | POA: Diagnosis not present

## 2014-08-12 DIAGNOSIS — F331 Major depressive disorder, recurrent, moderate: Secondary | ICD-10-CM | POA: Diagnosis not present

## 2014-08-31 DIAGNOSIS — F331 Major depressive disorder, recurrent, moderate: Secondary | ICD-10-CM | POA: Diagnosis not present

## 2014-10-12 DIAGNOSIS — F331 Major depressive disorder, recurrent, moderate: Secondary | ICD-10-CM | POA: Diagnosis not present

## 2014-10-23 DIAGNOSIS — F331 Major depressive disorder, recurrent, moderate: Secondary | ICD-10-CM | POA: Diagnosis not present

## 2014-10-28 DIAGNOSIS — F331 Major depressive disorder, recurrent, moderate: Secondary | ICD-10-CM | POA: Diagnosis not present

## 2014-11-02 DIAGNOSIS — I672 Cerebral atherosclerosis: Secondary | ICD-10-CM | POA: Diagnosis not present

## 2014-11-02 DIAGNOSIS — R2681 Unsteadiness on feet: Secondary | ICD-10-CM | POA: Diagnosis not present

## 2014-11-02 DIAGNOSIS — M47812 Spondylosis without myelopathy or radiculopathy, cervical region: Secondary | ICD-10-CM | POA: Diagnosis not present

## 2014-11-02 DIAGNOSIS — R413 Other amnesia: Secondary | ICD-10-CM | POA: Diagnosis not present

## 2014-11-02 DIAGNOSIS — D330 Benign neoplasm of brain, supratentorial: Secondary | ICD-10-CM | POA: Diagnosis not present

## 2014-11-02 DIAGNOSIS — G44219 Episodic tension-type headache, not intractable: Secondary | ICD-10-CM | POA: Diagnosis not present

## 2014-11-02 DIAGNOSIS — M542 Cervicalgia: Secondary | ICD-10-CM | POA: Diagnosis not present

## 2014-11-02 DIAGNOSIS — R030 Elevated blood-pressure reading, without diagnosis of hypertension: Secondary | ICD-10-CM | POA: Diagnosis not present

## 2014-11-03 ENCOUNTER — Other Ambulatory Visit: Payer: Self-pay | Admitting: Neurology

## 2014-11-03 DIAGNOSIS — F331 Major depressive disorder, recurrent, moderate: Secondary | ICD-10-CM | POA: Diagnosis not present

## 2014-11-03 DIAGNOSIS — D329 Benign neoplasm of meninges, unspecified: Secondary | ICD-10-CM

## 2014-11-11 DIAGNOSIS — F331 Major depressive disorder, recurrent, moderate: Secondary | ICD-10-CM | POA: Diagnosis not present

## 2014-11-13 ENCOUNTER — Other Ambulatory Visit: Payer: Medicare Other

## 2014-11-17 DIAGNOSIS — H52223 Regular astigmatism, bilateral: Secondary | ICD-10-CM | POA: Diagnosis not present

## 2014-11-17 DIAGNOSIS — H524 Presbyopia: Secondary | ICD-10-CM | POA: Diagnosis not present

## 2014-11-17 DIAGNOSIS — H26491 Other secondary cataract, right eye: Secondary | ICD-10-CM | POA: Diagnosis not present

## 2014-11-17 DIAGNOSIS — Z961 Presence of intraocular lens: Secondary | ICD-10-CM | POA: Diagnosis not present

## 2014-11-17 DIAGNOSIS — H35372 Puckering of macula, left eye: Secondary | ICD-10-CM | POA: Diagnosis not present

## 2014-11-19 ENCOUNTER — Other Ambulatory Visit: Payer: Self-pay | Admitting: Neurology

## 2014-11-19 DIAGNOSIS — Z01818 Encounter for other preprocedural examination: Secondary | ICD-10-CM | POA: Diagnosis not present

## 2014-11-19 LAB — BUN: BUN: 13 mg/dL (ref 6–23)

## 2014-11-19 LAB — CREATININE, SERUM: Creat: 0.82 mg/dL (ref 0.50–1.10)

## 2014-11-20 DIAGNOSIS — F331 Major depressive disorder, recurrent, moderate: Secondary | ICD-10-CM | POA: Diagnosis not present

## 2014-11-23 ENCOUNTER — Other Ambulatory Visit: Payer: Medicare Other

## 2014-11-25 DIAGNOSIS — H26491 Other secondary cataract, right eye: Secondary | ICD-10-CM | POA: Diagnosis not present

## 2014-12-14 DIAGNOSIS — H26492 Other secondary cataract, left eye: Secondary | ICD-10-CM | POA: Diagnosis not present

## 2014-12-16 DIAGNOSIS — F331 Major depressive disorder, recurrent, moderate: Secondary | ICD-10-CM | POA: Diagnosis not present

## 2014-12-21 DIAGNOSIS — F331 Major depressive disorder, recurrent, moderate: Secondary | ICD-10-CM | POA: Diagnosis not present

## 2014-12-29 DIAGNOSIS — F331 Major depressive disorder, recurrent, moderate: Secondary | ICD-10-CM | POA: Diagnosis not present

## 2015-01-14 ENCOUNTER — Ambulatory Visit
Admission: RE | Admit: 2015-01-14 | Discharge: 2015-01-14 | Disposition: A | Discharge status: Home / Self Care | Payer: Medicare Other | Source: Ambulatory Visit | Attending: Neurology | Admitting: Neurology

## 2015-01-14 DIAGNOSIS — D329 Benign neoplasm of meninges, unspecified: Secondary | ICD-10-CM

## 2015-01-14 DIAGNOSIS — I6782 Cerebral ischemia: Secondary | ICD-10-CM | POA: Diagnosis not present

## 2015-01-14 MED ORDER — GADOBENATE DIMEGLUMINE 529 MG/ML IV SOLN
16.0000 mL | Freq: Once | INTRAVENOUS | Status: AC | PRN
  Administered 2015-01-14: 16 mL via INTRAVENOUS

## 2015-02-02 DIAGNOSIS — F331 Major depressive disorder, recurrent, moderate: Secondary | ICD-10-CM | POA: Diagnosis not present

## 2015-03-23 DIAGNOSIS — F331 Major depressive disorder, recurrent, moderate: Secondary | ICD-10-CM | POA: Diagnosis not present

## 2015-04-15 DIAGNOSIS — F331 Major depressive disorder, recurrent, moderate: Secondary | ICD-10-CM | POA: Diagnosis not present

## 2015-04-27 DIAGNOSIS — F331 Major depressive disorder, recurrent, moderate: Secondary | ICD-10-CM | POA: Diagnosis not present

## 2015-05-04 DIAGNOSIS — F331 Major depressive disorder, recurrent, moderate: Secondary | ICD-10-CM | POA: Diagnosis not present

## 2015-05-21 DIAGNOSIS — F331 Major depressive disorder, recurrent, moderate: Secondary | ICD-10-CM | POA: Diagnosis not present

## 2015-06-03 DIAGNOSIS — F331 Major depressive disorder, recurrent, moderate: Secondary | ICD-10-CM | POA: Diagnosis not present

## 2015-06-07 DIAGNOSIS — D692 Other nonthrombocytopenic purpura: Secondary | ICD-10-CM | POA: Diagnosis not present

## 2015-06-07 DIAGNOSIS — L821 Other seborrheic keratosis: Secondary | ICD-10-CM | POA: Diagnosis not present

## 2015-06-07 DIAGNOSIS — L400 Psoriasis vulgaris: Secondary | ICD-10-CM | POA: Diagnosis not present

## 2015-06-07 DIAGNOSIS — L820 Inflamed seborrheic keratosis: Secondary | ICD-10-CM | POA: Diagnosis not present

## 2015-06-10 DIAGNOSIS — F331 Major depressive disorder, recurrent, moderate: Secondary | ICD-10-CM | POA: Diagnosis not present

## 2015-07-08 DIAGNOSIS — R0989 Other specified symptoms and signs involving the circulatory and respiratory systems: Secondary | ICD-10-CM | POA: Diagnosis not present

## 2015-07-08 DIAGNOSIS — D330 Benign neoplasm of brain, supratentorial: Secondary | ICD-10-CM | POA: Diagnosis not present

## 2015-07-08 DIAGNOSIS — M47812 Spondylosis without myelopathy or radiculopathy, cervical region: Secondary | ICD-10-CM | POA: Diagnosis not present

## 2015-07-08 DIAGNOSIS — M542 Cervicalgia: Secondary | ICD-10-CM | POA: Diagnosis not present

## 2015-07-08 DIAGNOSIS — G4485 Primary stabbing headache: Secondary | ICD-10-CM | POA: Diagnosis not present

## 2015-07-08 DIAGNOSIS — R030 Elevated blood-pressure reading, without diagnosis of hypertension: Secondary | ICD-10-CM | POA: Diagnosis not present

## 2015-07-08 DIAGNOSIS — I672 Cerebral atherosclerosis: Secondary | ICD-10-CM | POA: Diagnosis not present

## 2015-07-08 DIAGNOSIS — M791 Myalgia: Secondary | ICD-10-CM | POA: Diagnosis not present

## 2015-07-14 ENCOUNTER — Other Ambulatory Visit: Payer: Self-pay | Admitting: Neurology

## 2015-07-14 DIAGNOSIS — R0989 Other specified symptoms and signs involving the circulatory and respiratory systems: Secondary | ICD-10-CM

## 2015-07-21 DIAGNOSIS — F331 Major depressive disorder, recurrent, moderate: Secondary | ICD-10-CM | POA: Diagnosis not present

## 2015-07-28 ENCOUNTER — Encounter: Payer: Self-pay | Admitting: Internal Medicine

## 2015-07-28 ENCOUNTER — Ambulatory Visit (INDEPENDENT_AMBULATORY_CARE_PROVIDER_SITE_OTHER): Payer: Medicare Other | Admitting: Internal Medicine

## 2015-07-28 VITALS — BP 193/82 | HR 71 | Temp 97.9°F | Resp 16 | Ht 67.5 in | Wt 186.0 lb

## 2015-07-28 DIAGNOSIS — I10 Essential (primary) hypertension: Secondary | ICD-10-CM | POA: Diagnosis not present

## 2015-07-28 DIAGNOSIS — E039 Hypothyroidism, unspecified: Secondary | ICD-10-CM

## 2015-07-28 DIAGNOSIS — R5382 Chronic fatigue, unspecified: Secondary | ICD-10-CM

## 2015-07-28 DIAGNOSIS — R42 Dizziness and giddiness: Secondary | ICD-10-CM | POA: Diagnosis not present

## 2015-07-28 DIAGNOSIS — Z720 Tobacco use: Secondary | ICD-10-CM

## 2015-07-28 DIAGNOSIS — I70213 Atherosclerosis of native arteries of extremities with intermittent claudication, bilateral legs: Secondary | ICD-10-CM

## 2015-07-28 DIAGNOSIS — R110 Nausea: Secondary | ICD-10-CM | POA: Diagnosis not present

## 2015-07-28 DIAGNOSIS — F17200 Nicotine dependence, unspecified, uncomplicated: Secondary | ICD-10-CM

## 2015-07-28 NOTE — Progress Notes (Signed)
Subjective:    Patient ID: [name], female    DOB: 1946/07/15, 69 y.o.   MRN: [id number]  HPI  Chief Complaint  Patient presents with  . Fatigue--increased/daily 1 year  . Nausea---constant/yrs  . Thyroid Problem    wants thryroid checked  . Hypertension---out of control few yrs    -  Dizzines few yrs ---see chart  Patient Active Problem List   Diagnosis Date Noted  . Hypothyroid ? 01/16/2014  . Mild cognitive impairment 01/16/2014  . Meningioma ([place]) 10/21/2013  . Chest pain 10/20/2013  . Medicare annual wellness visit, subsequent 06/16/2013  . Dizziness and giddiness 06/16/2013  . RLS (restless legs syndrome) 04/29/2012  . Abdominal pain 12/27/2010  . DEGENERATIVE JOINT DISEASE 09/23/2010  . CAROTID ARTERY DISEASE 08/15/2010  . CHEST PAIN 08/15/2010  . HEADACHE 12/02/2009  . BACK PAIN 11/22/2009  . CONSTIPATION, CHRONIC 01/29/2008  . NAUSEA 01/28/2008  . DEPRESSION 03/08/2007  . HYPERTENSION 03/08/2007  :MRI 1. Mild atherosclerotic changes at the carotid bifurcations bilaterally without significant stenosis. 2. Stable remote lacunar infarct of the right caudate head and periventricular white matter disease. 3. Stable 12 mm meningioma in the right frontal convexity. 4. Moderate spondylosis of the cervical spine as described. 5. Mild atherosclerotic changes within the cavernous carotid arteries without significant stenosis. 6. No significant proximal stenosis, aneurysm, or branch vessel occlusion.  Dr Remus Blake Dr [place]//Andy Mitchum Dr Annia Friendly and his office notes regarding social and family history are extremely pertinent/he has noted a mild cognitive decline as well  Current outpatient prescriptions:  .  bisacodyl (DULCOLAX) 5 MG EC tablet, Take 5 mg by mouth daily as needed for constipation., Disp: , Rfl:  .  calcipotriene-betamethasone (TACLONEX) ointment, Apply topically daily., Disp: , Rfl:  .  clonazePAM (KLONOPIN) 2 MG tablet, 1 mg in the a.m and  afternoon, 2 mg at night, Disp: , Rfl:  .  DULoxetine (CYMBALTA) 30 MG capsule, Take 30 mg by mouth daily., Disp: , Rfl:  .  lamoTRIgine (LAMICTAL) 150 MG tablet, Take 150 mg by mouth daily., Disp: , Rfl:  .  magnesium gluconate (MAGONATE) 500 MG tablet, Take 1,000 mg by mouth daily. , Disp: , Rfl:  .  nitroGLYCERIN (NITROSTAT) 0.4 MG SL tablet, Place 1 tablet (0.4 mg total) under the tongue every 5 (five) minutes as needed for chest pain., Disp: 60 tablet, Rfl: 12 .  polyethylene glycol (MIRALAX / GLYCOLAX) packet, Take 17 g by mouth daily., Disp: , Rfl:  .  rOPINIRole (REQUIP) 2 MG tablet, TAKE 1 TABLET BY MOUTH AT BEDTIME, Disp: 30 tablet, Rfl: 0 .  ondansetron (ZOFRAN) 4 MG tablet, Take 1 tablet (4 mg total) by mouth every 8 (eight) hours as needed for nausea or vomiting. (Patient not taking: Reported on 07/28/2015), Disp: 20 tablet, Rfl: 0   Home= [place] but lives next to sis here(bipolar)  Review of Systems  Constitutional: Positive for activity change, fatigue and unexpected weight change. Negative for fever and appetite change.       Gaining wt slowly  HENT: Negative for trouble swallowing.   Respiratory: Positive for cough and shortness of breath. Negative for wheezing.        DOE Is a smoker Can't quit-tried vaping=boring Quit 2 yrs Smokes due to anx /depr==calms her  Cardiovascular: Positive for leg swelling. Negative for chest pain and palpitations.       Feet///not today  Intermittent groin pain with walking relieved by rest  Gastrointestinal: Positive for nausea.  Musculoskeletal: Positive for back pain. Negative for joint swelling.       Back painlumbar--respond to chiropr cerv problems and being scheduled for mri   Skin: Negative for rash.  Neurological: Negative for headaches.  Hematological: Bruises/bleeds easily.  Psychiatric/Behavioral: Negative for sleep disturbance.       Objective:   Physical Exam BP 193/82 mmHg  Pulse 71  Temp(Src) 97.9 F (36.6 C)   Resp 16  Ht 5' 7.5" (1.715 m)  Wt 186 lb (84.369 kg)  BMI 28.68 kg/m2 HEENT clear No thyromegaly or lymphadenopathy Heart regular without murmur//carotid bruits present(known) Lungs clear Extremities without edema She does have very slight bruits over both femoral areas/she has somewhat diminished  dorsalis pedis pulses with cold toes but no loss of sensation and no cyanosis Her mood is one of frustration because of no answers for her dizziness and fatigue and nausea Cranial nerves II through XII intact Finger to nose testing intact Gait normal Romberg negative      Assessment & Plan:  Hypothyroidism, unspecified hypothyroidism type - Plan: TSH, T4, free  Chronic fatigue - Plan: CBC with Differential/Platelet, Comprehensive metabolic panel  Dizzy  Smoker  Nausea without vomiting - Plan: Amylase, cont prn zofran  ? Atherosclerosis of native artery of both lower extremities with intermittent claudication ([place]) - Plan: VAS [place] to r/o need for revascularization  Essential hypertension -uncontrolled and not on medication  45 minute office visit. I will contact her with results and further plans.  Addendum 11/19-labs Results for orders placed or performed in visit on 07/28/15  CBC with Differential/Platelet  Result Value Ref Range   WBC 7.1 4.0 - 10.5 K/uL   RBC 4.81 3.87 - 5.11 MIL/uL   Hemoglobin 14.2 12.0 - 15.0 g/dL   HCT 42.7 36.0 - 46.0 %   MCV 88.8 78.0 - 100.0 fL   MCH 29.5 26.0 - 34.0 pg   MCHC 33.3 30.0 - 36.0 g/dL   RDW 14.5 11.5 - 15.5 %   Platelets 282 150 - 400 K/uL   MPV 9.3 8.6 - 12.4 fL   Neutrophils Relative % 53 43 - 77 %   Neutro Abs 3.8 1.7 - 7.7 K/uL   Lymphocytes Relative 35 12 - 46 %   Lymphs Abs 2.5 0.7 - 4.0 K/uL   Monocytes Relative 10 3 - 12 %   Monocytes Absolute 0.7 0.1 - 1.0 K/uL   Eosinophils Relative 2 0 - 5 %   Eosinophils Absolute 0.1 0.0 - 0.7 K/uL   Basophils Relative 0 0 - 1 %   Basophils Absolute 0.0 0.0 - 0.1 K/uL   Smear  Review Criteria for review not met   Comprehensive metabolic panel  Result Value Ref Range   Sodium 137 135 - 146 mmol/L   Potassium 4.2 3.5 - 5.3 mmol/L   Chloride 101 98 - 110 mmol/L   CO2 28 20 - 31 mmol/L   Glucose, Bld 76 65 - 99 mg/dL   BUN 8 7 - 25 mg/dL   Creat 0.77 0.60 - 0.93 mg/dL   Total Bilirubin 0.3 0.2 - 1.2 mg/dL   Alkaline Phosphatase 81 33 - 130 U/L   AST 16 10 - 35 U/L   ALT 13 6 - 29 U/L   Total Protein 7.2 6.1 - 8.1 g/dL   Albumin 4.2 3.6 - 5.1 g/dL   Calcium 9.2 8.6 - 10.4 mg/dL  TSH  Result Value Ref Range   TSH 5.626 (H) 0.350 - 4.500 uIU/mL  T4, free  Result Value Ref Range   Free T4 1.03 0.80 - 1.80 ng/dL  Amylase  Result Value Ref Range   Amylase 42 0 - 105 U/L   These values are normal or possibly show emerging hypothyroidism for which she does not need replacement yet. Labs can be repeated in 3 months with her primary care provider She is resistant to starting medication for hypertension but this would be important and I'll offer her diuretic therapy to begin (she was on Norvasc in 2014). In reviewing notes by Dr Larose Kells, I see no indication for any other clinical strategy

## 2015-07-29 LAB — COMPREHENSIVE METABOLIC PANEL
ALT: 13 U/L (ref 6–29)
AST: 16 U/L (ref 10–35)
Albumin: 4.2 g/dL (ref 3.6–5.1)
Alkaline Phosphatase: 81 U/L (ref 33–130)
BUN: 8 mg/dL (ref 7–25)
CO2: 28 mmol/L (ref 20–31)
Calcium: 9.2 mg/dL (ref 8.6–10.4)
Chloride: 101 mmol/L (ref 98–110)
Creat: 0.77 mg/dL (ref 0.60–0.93)
GLUCOSE: 76 mg/dL (ref 65–99)
POTASSIUM: 4.2 mmol/L (ref 3.5–5.3)
Sodium: 137 mmol/L (ref 135–146)
Total Bilirubin: 0.3 mg/dL (ref 0.2–1.2)
Total Protein: 7.2 g/dL (ref 6.1–8.1)

## 2015-07-29 LAB — CBC WITH DIFFERENTIAL/PLATELET
Basophils Absolute: 0 10*3/uL (ref 0.0–0.1)
Basophils Relative: 0 % (ref 0–1)
EOS ABS: 0.1 10*3/uL (ref 0.0–0.7)
EOS PCT: 2 % (ref 0–5)
HEMATOCRIT: 42.7 % (ref 36.0–46.0)
Hemoglobin: 14.2 g/dL (ref 12.0–15.0)
LYMPHS ABS: 2.5 10*3/uL (ref 0.7–4.0)
LYMPHS PCT: 35 % (ref 12–46)
MCH: 29.5 pg (ref 26.0–34.0)
MCHC: 33.3 g/dL (ref 30.0–36.0)
MCV: 88.8 fL (ref 78.0–100.0)
MONO ABS: 0.7 10*3/uL (ref 0.1–1.0)
MPV: 9.3 fL (ref 8.6–12.4)
Monocytes Relative: 10 % (ref 3–12)
Neutro Abs: 3.8 10*3/uL (ref 1.7–7.7)
Neutrophils Relative %: 53 % (ref 43–77)
Platelets: 282 10*3/uL (ref 150–400)
RBC: 4.81 MIL/uL (ref 3.87–5.11)
RDW: 14.5 % (ref 11.5–15.5)
WBC: 7.1 10*3/uL (ref 4.0–10.5)

## 2015-07-29 LAB — AMYLASE: AMYLASE: 42 U/L (ref 0–105)

## 2015-07-29 LAB — T4, FREE: Free T4: 1.03 ng/dL (ref 0.80–1.80)

## 2015-07-29 LAB — TSH: TSH: 5.626 u[IU]/mL — ABNORMAL HIGH (ref 0.350–4.500)

## 2015-07-30 ENCOUNTER — Telehealth: Payer: Self-pay

## 2015-07-30 NOTE — Telephone Encounter (Signed)
Please review

## 2015-07-30 NOTE — Telephone Encounter (Signed)
Pt.notified

## 2015-07-30 NOTE — Telephone Encounter (Signed)
Sent to [employer] all ok

## 2015-07-30 NOTE — Telephone Encounter (Signed)
She is concerned about the TSH. Are we going to treat her for this?

## 2015-07-30 NOTE — Telephone Encounter (Signed)
The patient is requesting a call back to discuss her lab results.  Please advise, thank you.  CB#: [phone]

## 2015-08-01 NOTE — Telephone Encounter (Signed)
Because the T4 was normal she does not need treatment at this point She should have Dr Larose Kells recheck the labs in 3 months to see if both tests are abnormal

## 2015-08-02 ENCOUNTER — Encounter: Payer: Self-pay | Admitting: Internal Medicine

## 2015-08-02 NOTE — Telephone Encounter (Signed)
Left message for pt to call back  °

## 2015-08-03 ENCOUNTER — Ambulatory Visit (HOSPITAL_COMMUNITY): Admission: RE | Admit: 2015-08-03 | Payer: Medicare Other | Source: Ambulatory Visit

## 2015-08-03 ENCOUNTER — Encounter (HOSPITAL_COMMUNITY): Payer: Medicare Other

## 2015-08-04 MED ORDER — CHLORTHALIDONE 25 MG PO TABS: 25.0000 mg | ORAL_TABLET | Freq: Every day | ORAL | Status: DC

## 2015-08-04 NOTE — Telephone Encounter (Signed)
Meds ordered this encounter  Medications  . chlorthalidone (HYGROTON) 25 MG tablet    Sig: Take 1 tablet (25 mg total) by mouth daily.    Dispense:  90 tablet    Refill:  0

## 2015-08-06 NOTE — Telephone Encounter (Signed)
Left message for pt to call back  °

## 2015-08-09 DIAGNOSIS — F331 Major depressive disorder, recurrent, moderate: Secondary | ICD-10-CM | POA: Diagnosis not present

## 2015-08-10 ENCOUNTER — Ambulatory Visit (HOSPITAL_COMMUNITY)
Admission: RE | Admit: 2015-08-10 | Discharge: 2015-08-10 | Disposition: A | Discharge status: Home / Self Care | Payer: Medicare Other | Source: Ambulatory Visit | Attending: Internal Medicine | Admitting: Internal Medicine

## 2015-08-10 DIAGNOSIS — I10 Essential (primary) hypertension: Secondary | ICD-10-CM | POA: Diagnosis not present

## 2015-08-10 DIAGNOSIS — M79605 Pain in left leg: Secondary | ICD-10-CM | POA: Diagnosis not present

## 2015-08-10 DIAGNOSIS — M79604 Pain in right leg: Secondary | ICD-10-CM | POA: Diagnosis not present

## 2015-08-10 DIAGNOSIS — M79606 Pain in leg, unspecified: Secondary | ICD-10-CM | POA: Diagnosis present

## 2015-08-10 DIAGNOSIS — I70213 Atherosclerosis of native arteries of extremities with intermittent claudication, bilateral legs: Secondary | ICD-10-CM | POA: Diagnosis not present

## 2015-08-11 ENCOUNTER — Encounter (HOSPITAL_COMMUNITY): Payer: Self-pay | Admitting: General Practice

## 2015-08-11 ENCOUNTER — Emergency Department (HOSPITAL_COMMUNITY): Payer: Medicare Other

## 2015-08-11 ENCOUNTER — Emergency Department (HOSPITAL_COMMUNITY)
Admission: EM | Admit: 2015-08-11 | Discharge: 2015-08-11 | Disposition: A | Discharge status: Home / Self Care | Payer: Medicare Other | Attending: Emergency Medicine | Admitting: Emergency Medicine

## 2015-08-11 DIAGNOSIS — R531 Weakness: Secondary | ICD-10-CM | POA: Diagnosis not present

## 2015-08-11 DIAGNOSIS — Z79899 Other long term (current) drug therapy: Secondary | ICD-10-CM | POA: Insufficient documentation

## 2015-08-11 DIAGNOSIS — Z872 Personal history of diseases of the skin and subcutaneous tissue: Secondary | ICD-10-CM | POA: Diagnosis not present

## 2015-08-11 DIAGNOSIS — R002 Palpitations: Secondary | ICD-10-CM | POA: Diagnosis not present

## 2015-08-11 DIAGNOSIS — R55 Syncope and collapse: Secondary | ICD-10-CM

## 2015-08-11 DIAGNOSIS — Z85841 Personal history of malignant neoplasm of brain: Secondary | ICD-10-CM | POA: Diagnosis not present

## 2015-08-11 DIAGNOSIS — R079 Chest pain, unspecified: Secondary | ICD-10-CM | POA: Insufficient documentation

## 2015-08-11 DIAGNOSIS — Z8639 Personal history of other endocrine, nutritional and metabolic disease: Secondary | ICD-10-CM | POA: Diagnosis not present

## 2015-08-11 DIAGNOSIS — F17210 Nicotine dependence, cigarettes, uncomplicated: Secondary | ICD-10-CM | POA: Insufficient documentation

## 2015-08-11 DIAGNOSIS — R1111 Vomiting without nausea: Secondary | ICD-10-CM | POA: Diagnosis not present

## 2015-08-11 DIAGNOSIS — F319 Bipolar disorder, unspecified: Secondary | ICD-10-CM | POA: Insufficient documentation

## 2015-08-11 DIAGNOSIS — I2510 Atherosclerotic heart disease of native coronary artery without angina pectoris: Secondary | ICD-10-CM | POA: Diagnosis not present

## 2015-08-11 DIAGNOSIS — R0602 Shortness of breath: Secondary | ICD-10-CM | POA: Diagnosis not present

## 2015-08-11 DIAGNOSIS — R110 Nausea: Secondary | ICD-10-CM | POA: Diagnosis not present

## 2015-08-11 DIAGNOSIS — K5900 Constipation, unspecified: Secondary | ICD-10-CM | POA: Diagnosis not present

## 2015-08-11 DIAGNOSIS — I10 Essential (primary) hypertension: Secondary | ICD-10-CM | POA: Insufficient documentation

## 2015-08-11 LAB — URINALYSIS, ROUTINE W REFLEX MICROSCOPIC
Bilirubin Urine: NEGATIVE
Glucose, UA: NEGATIVE mg/dL
Ketones, ur: NEGATIVE mg/dL
Nitrite: NEGATIVE
Protein, ur: NEGATIVE mg/dL
Specific Gravity, Urine: 1.011 (ref 1.005–1.030)
pH: 7 (ref 5.0–8.0)

## 2015-08-11 LAB — CBC
HCT: 40.9 % (ref 36.0–46.0)
Hemoglobin: 13.4 g/dL (ref 12.0–15.0)
MCH: 29.8 pg (ref 26.0–34.0)
MCHC: 32.8 g/dL (ref 30.0–36.0)
MCV: 91.1 fL (ref 78.0–100.0)
Platelets: 224 10*3/uL (ref 150–400)
RBC: 4.49 MIL/uL (ref 3.87–5.11)
RDW: 14 % (ref 11.5–15.5)
WBC: 9.3 10*3/uL (ref 4.0–10.5)

## 2015-08-11 LAB — BASIC METABOLIC PANEL
Anion gap: 10 (ref 5–15)
BUN: 12 mg/dL (ref 6–20)
CO2: 28 mmol/L (ref 22–32)
Calcium: 9.3 mg/dL (ref 8.9–10.3)
Chloride: 95 mmol/L — ABNORMAL LOW (ref 101–111)
Creatinine, Ser: 0.88 mg/dL (ref 0.44–1.00)
GFR calc Af Amer: >60 mL/min (ref >60)
GFR calc non Af Amer: >60 mL/min (ref >60)
Glucose, Bld: 121 mg/dL — ABNORMAL HIGH (ref 65–99)
Potassium: 3.5 mmol/L (ref 3.5–5.1)
Sodium: 133 mmol/L — ABNORMAL LOW (ref 135–145)

## 2015-08-11 LAB — I-STAT TROPONIN, ED: Troponin i, poc: 0.01 ng/mL (ref 0.00–0.08)

## 2015-08-11 LAB — TROPONIN I: Troponin I: <0.03 ng/mL (ref <0.031)

## 2015-08-11 LAB — URINE MICROSCOPIC-ADD ON

## 2015-08-11 LAB — CBG MONITORING, ED: Glucose-Capillary: 119 mg/dL — ABNORMAL HIGH (ref 65–99)

## 2015-08-11 LAB — TSH: TSH: 3.676 u[IU]/mL (ref 0.350–4.500)

## 2015-08-11 MED ORDER — ONDANSETRON HCL 4 MG PO TABS: 4.0000 mg | ORAL_TABLET | Freq: Four times a day (QID) | ORAL | Status: DC

## 2015-08-11 NOTE — ED Notes (Signed)
Pt presents with complaints of weakness and chest pain that got progressively worse this morning at 0730. Pt reports having chronic weakness and chest pain. Pt lives home with her husband, and reports an episode of cold chills, diaphoresis, and being pale, after attempting to have a bowel movement. Pt reports the chest pain as a pressure/tightness and is currently rating pain a 1/10. EMS gave pt 2 nitroglycerin tablets, and patient took 1 nitro prior to EMS arrival. EMS also gave pt 1 324 mg of ASA, and 8mg  of zofran. Pt is A/O and neurologically intact.

## 2015-08-11 NOTE — Discharge Instructions (Signed)
Nausea, Adult Nausea is the feeling that you have an upset stomach or have to vomit. Nausea by itself is not likely a serious concern, but it may be an early sign of more serious medical problems. As nausea gets worse, it can lead to vomiting. If vomiting develops, there is the risk of dehydration.  CAUSES   Viral infections.  Food poisoning.  Medicines.  Pregnancy.  Motion sickness.  Migraine headaches.  Emotional distress.  Severe pain from any source.  Alcohol intoxication. HOME CARE INSTRUCTIONS  Get plenty of rest.  Ask your caregiver about specific rehydration instructions.  Eat small amounts of food and sip liquids more often.  Take all medicines as told by your caregiver. SEEK MEDICAL CARE IF:  You have not improved after 2 days, or you get worse.  You have a headache. SEEK IMMEDIATE MEDICAL CARE IF:   You have a fever.  You faint.  You keep vomiting or have blood in your vomit.  You are extremely weak or dehydrated.  You have dark or bloody stools.  You have severe chest or abdominal pain. MAKE SURE YOU:  Understand these instructions.  Will watch your condition.  Will get help right away if you are not doing well or get worse.   This information is not intended to replace advice given to you by your health care provider. Make sure you discuss any questions you have with your health care provider.   Document Released: 10/05/2004 Document Revised: 09/18/2014 Document Reviewed: 05/10/2011 Elsevier Interactive Patient Education 2016 [employer].  Syncope, commonly known as fainting, is a temporary loss of consciousness. It occurs when the blood flow to the brain is reduced. Vasovagal syncope (also called neurocardiogenic syncope) is a fainting spell in which the blood flow to the brain is reduced because of a sudden drop in heart rate and blood pressure. Vasovagal syncope occurs when the brain and the cardiovascular system (blood vessels) do not  adequately communicate and respond to each other. This is the most common cause of fainting. It often occurs in response to fear or some other type of emotional or physical stress. The body has a reaction in which the heart starts beating too slowly or the blood vessels expand, reducing blood pressure. This type of fainting spell is generally considered harmless. However, injuries can occur if a person takes a sudden fall during a fainting spell.  CAUSES  Vasovagal syncope occurs when a person's blood pressure and heart rate decrease suddenly, usually in response to a trigger. Many things and situations can trigger an episode. Some of these include:   Pain.   Fear.   The sight of blood or medical procedures, such as blood being drawn from a vein.   Common activities, such as coughing, swallowing, stretching, or going to the bathroom.   Emotional stress.   Prolonged standing, especially in a warm environment.   Lack of sleep or rest.   Prolonged lack of food.   Prolonged lack of fluids.   Recent illness.  The use of certain drugs that affect blood pressure, such as cocaine, alcohol, marijuana, inhalants, and opiates.  SYMPTOMS  Before the fainting episode, you may:   Feel dizzy or light headed.   Become pale.  Sense that you are going to faint.   Feel like the room is spinning.   Have tunnel vision, only seeing directly in front of you.   Feel sick to your stomach (nauseous).   See spots or slowly lose vision.  Hear ringing in your ears.   Have a headache.   Feel warm and sweaty.   Feel a sensation of pins and needles. During the fainting spell, you will generally be unconscious for no longer than a couple minutes before waking up and returning to normal. If you get up too quickly before your body can recover, you may faint again. Some twitching or jerky movements may occur during the fainting spell.  DIAGNOSIS  Your health care provider will ask  about your symptoms, take a medical history, and perform a physical exam. Various tests may be done to rule out other causes of fainting. These may include blood tests and tests to check the heart, such as electrocardiography, echocardiography, and possibly an electrophysiology study. When other causes have been ruled out, a test may be done to check the body's response to changes in position (tilt table test). TREATMENT  Most cases of vasovagal syncope do not require treatment. Your health care provider may recommend ways to avoid fainting triggers and may provide home strategies for preventing fainting. If you must be exposed to a possible trigger, you can drink additional fluids to help reduce your chances of having an episode of vasovagal syncope. If you have warning signs of an oncoming episode, you can respond by positioning yourself favorably (lying down). If your fainting spells continue, you may be given medicines to prevent fainting. Some medicines may help make you more resistant to repeated episodes of vasovagal syncope. Special exercises or compression stockings may be recommended. In rare cases, the surgical placement of a pacemaker is considered. HOME CARE INSTRUCTIONS   Learn to identify the warning signs of vasovagal syncope.   Sit or lie down at the first warning sign of a fainting spell. If sitting, put your head down between your legs. If you lie down, swing your legs up in the air to increase blood flow to the brain.   Avoid hot tubs and saunas.  Avoid prolonged standing.  Drink enough fluids to keep your urine clear or pale yellow. Avoid caffeine.  Increase salt in your diet as directed by your health care provider.   If you have to stand for a long time, perform movements such as:   Crossing your legs.   Flexing and stretching your leg muscles.   Squatting.   Moving your legs.   Bending over.   Only take over-the-counter or prescription medicines as  directed by your health care provider. Do not suddenly stop any medicines without asking your health care provider first. [place] IF:   Your fainting spells continue or happen more frequently in spite of treatment.   You lose consciousness for more than a couple minutes.  You have fainting spells during or after exercising or after being startled.   You have new symptoms that occur with the fainting spells, such as:   Shortness of breath.  Chest pain.   Irregular heartbeat.   You have episodes of twitching or jerky movements that last longer than a few seconds.  You have episodes of twitching or jerky movements without obvious fainting. SEEK IMMEDIATE MEDICAL CARE IF:   You have injuries or bleeding after a fainting spell.   You have episodes of twitching or jerky movements that last longer than 5 minutes.   You have more than one spell of twitching or jerky movements before returning to consciousness after fainting.   This information is not intended to replace advice given to you by your health care  provider. Make sure you discuss any questions you have with your health care provider.   Document Released: 08/14/2012 Document Revised: 01/12/2015 Document Reviewed: 08/14/2012 Elsevier Interactive Patient Education [employer].

## 2015-08-17 ENCOUNTER — Ambulatory Visit
Admission: RE | Admit: 2015-08-17 | Discharge: 2015-08-17 | Disposition: A | Discharge status: Home / Self Care | Payer: Medicare Other | Source: Ambulatory Visit | Attending: Neurology | Admitting: Neurology

## 2015-08-17 DIAGNOSIS — I6523 Occlusion and stenosis of bilateral carotid arteries: Secondary | ICD-10-CM | POA: Diagnosis not present

## 2015-08-17 DIAGNOSIS — R0989 Other specified symptoms and signs involving the circulatory and respiratory systems: Secondary | ICD-10-CM

## 2015-08-20 NOTE — ED Provider Notes (Signed)
CSN: [id number]     Arrival date & time 08/11/15  1007 History   First MD Initiated Contact with Patient 08/11/15 1019     Chief Complaint  Patient presents with  . Chest Pain  . Weakness     (Consider location/radiation/quality/duration/timing/severity/associated sxs/prior Treatment) HPI   69 year old female with near syncope. Onset this morning around 7:30. Patient was in her bathroom and just had a bowel movement. She suddenly became sweaty, cold and felt like she may pass out. Her husband said that she is very pale. She did not actually lose consciousness. Some tightness in her chest which has since resolved. Respiratory complaints. Patient was in her usual state of health prior to this episode. She currently has no complaints.  Past Medical History  Diagnosis Date  . Hypertension   . Constipation     chronic;severe  . CAD (coronary artery disease)     intra and extracranial vascular dx per MRI 4/11, neurology rec strict CVRF control  . Depression   . Psoriasis     sees derm  . EKG abnormalities     changes, stress test neg (false EKG changes)  . Colonic inertia   . Bipolar disorder ([place])   . GERD (gastroesophageal reflux disease)   . Gastritis   . Duodenitis   . Meningioma ([place]) 10/21/2013  . Hypothyroid 01/16/2014   Past Surgical History  Procedure Laterality Date  . Tubal ligation    . Arthroscopy  04/2010    Right knee   Family History  Problem Relation Age of Onset  . Breast cancer Mother     metastisis to bones  . Diabetes Son   . Heart disease      grandfather   . Alcohol abuse Brother   . Colon cancer Neg Hx   . Heart disease Maternal Aunt    Social History  Substance Use Topics  . Smoking status: Current Every Day Smoker -- 1.00 packs/day    Types: Cigarettes  . Smokeless tobacco: Never Used  . Alcohol Use: Yes     Comment: yes on occassion   OB History    No data available     Review of Systems  All systems reviewed and negative, other than  as noted in HPI.   Allergies  Review of patient's allergies indicates no known allergies.  Home Medications   Prior to Admission medications   Medication Sig Start Date End Date Taking? Authorizing Provider  bisacodyl (DULCOLAX) 5 MG EC tablet Take 5 mg by mouth daily as needed for constipation.   Yes Historical Provider, MD  chlorthalidone (HYGROTON) 25 MG tablet Take 1 tablet (25 mg total) by mouth daily. 08/04/15  Yes Leandrew Koyanagi, MD  clonazePAM (KLONOPIN) 2 MG tablet Take 3 mg by mouth at bedtime.  04/29/12  Yes Leandrew Koyanagi, MD  DULoxetine (CYMBALTA) 30 MG capsule Take 90 mg by mouth daily.    Yes Historical Provider, MD  lamoTRIgine (LAMICTAL) 150 MG tablet Take 150 mg by mouth daily.   Yes Historical Provider, MD  magnesium gluconate (MAGONATE) 500 MG tablet Take 1,000 mg by mouth daily.    Yes Historical Provider, MD  nitroGLYCERIN (NITROSTAT) 0.4 MG SL tablet Place 1 tablet (0.4 mg total) under the tongue every 5 (five) minutes as needed for chest pain. 10/22/13  Yes Ripudeep Krystal Eaton, MD  rOPINIRole (REQUIP) 2 MG tablet TAKE 1 TABLET BY MOUTH AT BEDTIME 05/27/12  Yes Ryan M Dunn, PA-C  calcipotriene-betamethasone (TACLONEX) ointment Apply  topically daily.    Historical Provider, MD  ondansetron (ZOFRAN) 4 MG tablet Take 1 tablet (4 mg total) by mouth every 8 (eight) hours as needed for nausea or vomiting. Patient not taking: Reported on 07/28/2015 01/19/14   Colon Branch, MD  ondansetron (ZOFRAN) 4 MG tablet Take 1 tablet (4 mg total) by mouth every 6 (six) hours. 08/11/15   Virgel Manifold, MD  polyethylene glycol [facility] / Floria Raveling) packet Take 17 g by mouth daily.    Historical Provider, MD  simvastatin (ZOCOR) 10 MG tablet Take 1 tablet (10 mg total) by mouth at bedtime. Patient not taking: Reported on 07/28/2015 10/22/13   Ripudeep K Rai, MD   BP 125/70 mmHg  Pulse 66  Temp(Src) 97.8 F (36.6 C) (Oral)  Resp 14  Ht 5' 8.5" (1.74 m)  Wt 185 lb (83.915 kg)  BMI 27.72  kg/m2  SpO2 92% Physical Exam  Constitutional: She appears well-developed and well-nourished. No distress.  HENT:  Head: Normocephalic and atraumatic.  Eyes: Conjunctivae are normal. Right eye exhibits no discharge. Left eye exhibits no discharge.  Neck: Neck supple.  Cardiovascular: Normal rate, regular rhythm and normal heart sounds.  Exam reveals no gallop and no friction rub.   No murmur heard. Pulmonary/Chest: Effort normal and breath sounds normal. No respiratory distress.  Abdominal: Soft. She exhibits no distension. There is no tenderness.  Musculoskeletal: She exhibits no edema or tenderness.  Neurological: She is alert.  Skin: Skin is warm and dry.  Psychiatric: She has a normal mood and affect. Her behavior is normal. Thought content normal.  Nursing note and vitals reviewed.   ED Course  Procedures (including critical care time) Labs Review Labs Reviewed  BASIC METABOLIC PANEL - Abnormal; Notable for the following:    Sodium 133 (*)    Chloride 95 (*)    Glucose, Bld 121 (*)    All other components within normal limits  URINALYSIS, ROUTINE W REFLEX MICROSCOPIC (NOT AT [facility]) - Abnormal; Notable for the following:    APPearance CLOUDY (*)    Hgb urine dipstick SMALL (*)    Leukocytes, UA SMALL (*)    All other components within normal limits  URINE MICROSCOPIC-ADD ON - Abnormal; Notable for the following:    Squamous Epithelial / LPF 6-30 (*)    Bacteria, UA RARE (*)    Casts HYALINE CASTS (*)    All other components within normal limits  CBG MONITORING, ED - Abnormal; Notable for the following:    Glucose-Capillary 119 (*)    All other components within normal limits  CBC  TSH  TROPONIN I  I-STAT TROPOININ, ED    Imaging Review No results found. I have personally reviewed and evaluated these images and lab results as part of my medical decision-making.   EKG Interpretation   Date/Time:  Wednesday August 11 2015 10:16:08 EST Ventricular Rate:  66 PR  Interval:  173 QRS Duration: 85 QT Interval:  400 QTC Calculation: 419 R Axis:   48 Text Interpretation:  Sinus rhythm LAE, consider biatrial enlargement  Nonspecific repol abnormality, lateral leads similar to previous from  10/2013 Confirmed by Jacquilyn Seldon  MD, [place] ([id number]) on 08/11/2015 10:22:10 AM      MDM   Final diagnoses:  Nausea  Near syncope    69 year old female with near syncope. Suspect possibly vasovagal. Her exam is unremarkable. She is hemodynamically stable. No further complaints on the emergency room.It has been determined that no acute conditions requiring further emergency  intervention are present at this time. The patient has been advised of the diagnosis and plan. I reviewed any labs and imaging including any potential incidental findings. We have discussed signs and symptoms that warrant return to the ED and they are listed in the discharge instructions.      Virgel Manifold, MD 08/20/15 [phone]

## 2015-08-25 DIAGNOSIS — F331 Major depressive disorder, recurrent, moderate: Secondary | ICD-10-CM | POA: Diagnosis not present

## 2015-08-26 DIAGNOSIS — R030 Elevated blood-pressure reading, without diagnosis of hypertension: Secondary | ICD-10-CM | POA: Diagnosis not present

## 2015-08-26 DIAGNOSIS — R0989 Other specified symptoms and signs involving the circulatory and respiratory systems: Secondary | ICD-10-CM | POA: Diagnosis not present

## 2015-08-26 DIAGNOSIS — D330 Benign neoplasm of brain, supratentorial: Secondary | ICD-10-CM | POA: Diagnosis not present

## 2015-08-26 DIAGNOSIS — G4453 Primary thunderclap headache: Secondary | ICD-10-CM | POA: Diagnosis not present

## 2015-08-26 DIAGNOSIS — I672 Cerebral atherosclerosis: Secondary | ICD-10-CM | POA: Diagnosis not present

## 2015-08-27 ENCOUNTER — Telehealth: Payer: Self-pay

## 2015-08-27 NOTE — Telephone Encounter (Signed)
Lft message to schedule appointment for AWV and cpe

## 2015-08-30 ENCOUNTER — Other Ambulatory Visit: Payer: Self-pay | Admitting: Neurology

## 2015-08-30 DIAGNOSIS — D329 Benign neoplasm of meninges, unspecified: Secondary | ICD-10-CM

## 2015-09-24 DIAGNOSIS — F331 Major depressive disorder, recurrent, moderate: Secondary | ICD-10-CM | POA: Diagnosis not present

## 2015-09-26 ENCOUNTER — Encounter: Payer: Self-pay | Admitting: Internal Medicine

## 2015-09-28 ENCOUNTER — Other Ambulatory Visit: Payer: Medicare Other

## 2015-09-28 ENCOUNTER — Inpatient Hospital Stay: Admission: RE | Admit: 2015-09-28 | Payer: Medicare Other | Source: Ambulatory Visit

## 2015-10-03 ENCOUNTER — Ambulatory Visit
Admission: RE | Admit: 2015-10-03 | Discharge: 2015-10-03 | Disposition: A | Discharge status: Home / Self Care | Payer: Medicare Other | Source: Ambulatory Visit | Attending: Neurology | Admitting: Neurology

## 2015-10-03 DIAGNOSIS — D329 Benign neoplasm of meninges, unspecified: Secondary | ICD-10-CM

## 2015-10-03 MED ORDER — GADOBENATE DIMEGLUMINE 529 MG/ML IV SOLN
17.0000 mL | Freq: Once | INTRAVENOUS | Status: AC | PRN
  Administered 2015-10-03: 17 mL via INTRAVENOUS

## 2015-10-12 DIAGNOSIS — F331 Major depressive disorder, recurrent, moderate: Secondary | ICD-10-CM | POA: Diagnosis not present

## 2015-11-25 DIAGNOSIS — F331 Major depressive disorder, recurrent, moderate: Secondary | ICD-10-CM | POA: Diagnosis not present

## 2015-11-30 DIAGNOSIS — F331 Major depressive disorder, recurrent, moderate: Secondary | ICD-10-CM | POA: Diagnosis not present

## 2015-12-01 DIAGNOSIS — R030 Elevated blood-pressure reading, without diagnosis of hypertension: Secondary | ICD-10-CM | POA: Diagnosis not present

## 2015-12-01 DIAGNOSIS — R51 Headache: Secondary | ICD-10-CM | POA: Diagnosis not present

## 2015-12-01 DIAGNOSIS — I672 Cerebral atherosclerosis: Secondary | ICD-10-CM | POA: Diagnosis not present

## 2015-12-01 DIAGNOSIS — M542 Cervicalgia: Secondary | ICD-10-CM | POA: Diagnosis not present

## 2015-12-01 DIAGNOSIS — D330 Benign neoplasm of brain, supratentorial: Secondary | ICD-10-CM | POA: Diagnosis not present

## 2015-12-06 ENCOUNTER — Telehealth: Payer: Self-pay | Admitting: Family Medicine

## 2015-12-06 NOTE — Telephone Encounter (Signed)
LEFT A MESSAGE FOR PATIENT TO RETURN CALL.  NEED TO FIND OUT IF WE ARE HER PRIMARY CARE

## 2015-12-14 DIAGNOSIS — F331 Major depressive disorder, recurrent, moderate: Secondary | ICD-10-CM | POA: Diagnosis not present

## 2015-12-22 ENCOUNTER — Ambulatory Visit (INDEPENDENT_AMBULATORY_CARE_PROVIDER_SITE_OTHER): Payer: Medicare Other | Admitting: Internal Medicine

## 2015-12-22 ENCOUNTER — Ambulatory Visit (INDEPENDENT_AMBULATORY_CARE_PROVIDER_SITE_OTHER): Payer: Medicare Other

## 2015-12-22 VITALS — BP 174/83 | HR 71 | Temp 98.2°F | Resp 16 | Ht 67.5 in | Wt 185.0 lb

## 2015-12-22 DIAGNOSIS — I10 Essential (primary) hypertension: Secondary | ICD-10-CM

## 2015-12-22 DIAGNOSIS — R05 Cough: Secondary | ICD-10-CM

## 2015-12-22 DIAGNOSIS — R110 Nausea: Secondary | ICD-10-CM

## 2015-12-22 DIAGNOSIS — H6092 Unspecified otitis externa, left ear: Secondary | ICD-10-CM

## 2015-12-22 DIAGNOSIS — Z720 Tobacco use: Secondary | ICD-10-CM | POA: Diagnosis not present

## 2015-12-22 DIAGNOSIS — R059 Cough, unspecified: Secondary | ICD-10-CM

## 2015-12-22 DIAGNOSIS — F17200 Nicotine dependence, unspecified, uncomplicated: Secondary | ICD-10-CM

## 2015-12-22 MED ORDER — HYDROCHLOROTHIAZIDE 12.5 MG PO CAPS: 12.5000 mg | ORAL_CAPSULE | Freq: Every day | ORAL | Status: DC

## 2015-12-22 MED ORDER — ONDANSETRON HCL 4 MG PO TABS: 4.0000 mg | ORAL_TABLET | Freq: Four times a day (QID) | ORAL | Status: DC

## 2015-12-22 MED ORDER — ALBUTEROL SULFATE HFA 108 (90 BASE) MCG/ACT IN AERS: 2.0000 | INHALATION_SPRAY | Freq: Four times a day (QID) | RESPIRATORY_TRACT | Status: DC | PRN

## 2015-12-22 MED ORDER — NEOMYCIN-POLYMYXIN-HC 3.5-10000-1 OT SUSP: 3.0000 [drp] | Freq: Four times a day (QID) | OTIC | Status: DC

## 2015-12-22 NOTE — Progress Notes (Signed)
Subjective:    Patient ID: [name], female    DOB: Nov 20, 1945, 70 y.o.   MRN: [id number]  HPI plans never to see her pcp Dr Larose Kells again//It is not clear why she is unhappy Chief Complaint  Patient presents with  . Cough  . itchy ears  She has a history of a cough for the last 4 weeks that started with a febrile illness with myalgias. It is not productive. It seems worse with activity but she denies wheezing. She does have a history of reactive airway disease in the past with infections. She has a chronic smoker and has been unable to quit.  She also has noted itching in her left ear for several weeks. This is an intermittent problem. She has a history of psoriasis and wonders if she has this in her left ear canal. There is no change in hearing.  She is surprised that she has elevated blood pressure today. I note, she has a history of hypertension and has chosen to stop all of her medications several times in the past. Mild cognitive impairment is listed on her problem list although she and her husband do not acknowledge that this is a problem.  Patient Active Problem List as listed in the chart --note that there are several symptoms listed better not actual diagnosis but indicate ongoing problems.    Diagnosis Date Noted  . Hypothyroid ? 01/16/2014  . Mild cognitive impairment 01/16/2014  . Meningioma ([place]) 10/21/2013  . Chest pain 10/20/2013  . Medicare annual wellness visit, subsequent 06/16/2013  . Dizziness and giddiness 06/16/2013  . RLS (restless legs syndrome) 04/29/2012  . Abdominal pain 12/27/2010  . DEGENERATIVE JOINT DISEASE 09/23/2010  . CAROTID ARTERY DISEASE 08/15/2010  . CHEST PAIN 08/15/2010  . HEADACHE 12/02/2009  . BACK PAIN 11/22/2009  . CONSTIPATION, CHRONIC 01/29/2008  . NAUSEA 01/28/2008  . DEPRESSION---Psychiatrist--[place]/counselor [place] 03/08/2007  . Essential hypertension 03/08/2007    Current outpatient prescriptions:  .  bisacodyl (DULCOLAX) 5  MG EC tablet, Take 5 mg by mouth daily as needed for constipation., Disp: , Rfl:  .  calcipotriene-betamethasone (TACLONEX) ointment, Apply topically daily., Disp: , Rfl:  .  clonazePAM (KLONOPIN) 2 MG tablet, Take 2 mg by mouth at bedtime. , Disp: , Rfl:  .  DULoxetine (CYMBALTA) 30 MG capsule, Take 90 mg by mouth daily. , Disp: , Rfl:  .  lamoTRIgine (LAMICTAL) 150 MG tablet, Take 150 mg by mouth daily., Disp: , Rfl:  .  magnesium gluconate (MAGONATE) 500 MG tablet, Take 1,000 mg by mouth daily. , Disp: , Rfl:  .  nitroGLYCERIN (NITROSTAT) 0.4 MG SL tablet, Place 1 tablet (0.4 mg total) under the tongue every 5 (five) minutes as needed for chest pain., Disp: 60 tablet, Rfl: 12 .  ondansetron (ZOFRAN) 4 MG tablet, Take 1 tablet (4 mg total) by mouth every 6 (six) hours., Disp: 20 tablet, Rfl: 0 .  polyethylene glycol (MIRALAX / GLYCOLAX) packet, Take 17 g by mouth daily., Disp: , Rfl:  .  rOPINIRole (REQUIP) 2 MG tablet, TAKE 1 TABLET BY MOUTH AT BEDTIME, Disp: 30 tablet, Rfl: 0 .  simvastatin (ZOCOR) 10 MG tablet, Take 1 tablet (10 mg total) by mouth at bedtime. (Patient not taking: Reported on 07/28/2015), Disp: 30 tablet, Rfl: 2   Review of Systems No fever chills or night sweats No abnormal weight loss No change in appetite No current chest pain or palpitations No abdominal pain//she has chronic problems with nausea for  which she takes Zofran successfully. There is not been established etiology gastroenterology. She needs a refill of this medicine today. No change in urination No headaches or difficulty finding words    Objective:   Physical Exam BP 174/83 mmHg  Pulse 71  Temp(Src) 98.2 F (36.8 C)  Resp 16  Ht 5' 7.5" (1.715 m)  Wt 185 lb (83.915 kg)  BMI 28.53 kg/m2  No acute distress PERRLA with EOMs conjugate ENT clear except the left auditory canal has mild irritation with flaky skin in some wax/hearing intact No carotid bruits Heart regular without murmur Lungs have  decreased breath sounds but without rhonchi. Forced expiration produces cough. There is mild wheezing bilaterally anteriorly on forced expiration Extremities without edema/good peripheral pulses Cranial nerves II through XII intact Alert and oriented to time person place Gait normal Mood stable      Assessment & Plan:  Cough - Plan: DG Chest 2 View= IMPRESSION: Mildly progressive interstitial prominence at both lung bases without definite edema. Given the patient's symptoms, findings could represent mild interstitial lung disease. Consider further evaluation with high-resolution chest CT. --- I will have the office arrange this with her She will be started on albuterol when necessary  Nausea without vomiting--refill Zofran  Essential hypertension//uncontrolled --- start Microzide 12.5 mg and follow outside blood pressures/she will need recheck in 2-4 weeks.   Smoker--unwilling to quit  External otitis left--Cortisporin 2 weeks  She very likely has COPD and  arteriosclerosis without assessment of cardiac function in recent years  Meds ordered this encounter  Medications  . ondansetron (ZOFRAN) 4 MG tablet    Sig: Take 1 tablet (4 mg total) by mouth every 6 (six) hours.    Dispense:  20 tablet    Refill:  0  . neomycin-polymyxin-hydrocortisone (CORTISPORIN) 3.5-10000-1 otic suspension    Sig: Place 3 drops into the left ear 4 (four) times daily.    Dispense:  10 mL    Refill:  0  . hydrochlorothiazide (MICROZIDE) 12.5 MG capsule    Sig: Take 1 capsule (12.5 mg total) by mouth daily.    Dispense:  90 capsule    Refill:  0  . albuterol (PROVENTIL HFA;VENTOLIN HFA) 108 (90 Base) MCG/ACT inhaler    Sig: Inhale 2 puffs into the lungs every 6 (six) hours as needed for wheezing or shortness of breath.    Dispense:  1 Inhaler    Refill:  0   She was hoping to change her primary care provider to me but I explained that I will be retiring in May. We will try to help her establish  a new PCP. I've given geographically suitable mains for she and her husband comes that her.

## 2015-12-24 DIAGNOSIS — F17210 Nicotine dependence, cigarettes, uncomplicated: Secondary | ICD-10-CM | POA: Insufficient documentation

## 2015-12-26 ENCOUNTER — Encounter: Payer: Self-pay | Admitting: Internal Medicine

## 2016-01-02 ENCOUNTER — Encounter: Payer: Self-pay | Admitting: *Deleted

## 2016-01-03 ENCOUNTER — Encounter: Payer: Self-pay | Admitting: Internal Medicine

## 2016-01-04 ENCOUNTER — Telehealth: Payer: Self-pay | Admitting: Internal Medicine

## 2016-01-04 DIAGNOSIS — F331 Major depressive disorder, recurrent, moderate: Secondary | ICD-10-CM | POA: Diagnosis not present

## 2016-01-04 NOTE — Telephone Encounter (Signed)
Pt called in because she says that she received a vm from her PCP's assistant. Not showing that anyone from our office has called, if so please fu with pt at : [phone]

## 2016-01-04 NOTE — Telephone Encounter (Signed)
If there is no note in chart, then I cannot inform patient of anything further, as I am only working with Dr. Larose Kells 2.5 days this week and I can account for my acyions/SLS 04/25

## 2016-01-05 ENCOUNTER — Telehealth: Payer: Self-pay

## 2016-01-05 DIAGNOSIS — R9389 Abnormal findings on diagnostic imaging of other specified body structures: Secondary | ICD-10-CM

## 2016-01-05 NOTE — Telephone Encounter (Signed)
The patient called to inquire about a CT Chest that she said Dr Laney Pastor was supposed to order for her.  I do not see one in the system.  Please advise.  CB#: [phone]

## 2016-01-06 NOTE — Telephone Encounter (Signed)
Dr. Laney Pastor  Please see previous message, looks like you were emailing the pt. In MyChart in regards to this

## 2016-01-06 NOTE — Telephone Encounter (Signed)
IMPRESSION:cxr last ov Mildly progressive interstitial prominence at both lung bases without definite edema. Given the patient's symptoms, findings could represent mild interstitial lung disease. Consider further evaluation with high-resolution chest CT.   Will order ct

## 2016-01-10 DIAGNOSIS — M9903 Segmental and somatic dysfunction of lumbar region: Secondary | ICD-10-CM | POA: Diagnosis not present

## 2016-01-10 DIAGNOSIS — M4317 Spondylolisthesis, lumbosacral region: Secondary | ICD-10-CM | POA: Diagnosis not present

## 2016-01-10 DIAGNOSIS — M5136 Other intervertebral disc degeneration, lumbar region: Secondary | ICD-10-CM | POA: Diagnosis not present

## 2016-01-10 DIAGNOSIS — M9902 Segmental and somatic dysfunction of thoracic region: Secondary | ICD-10-CM | POA: Diagnosis not present

## 2016-01-10 DIAGNOSIS — M5134 Other intervertebral disc degeneration, thoracic region: Secondary | ICD-10-CM | POA: Diagnosis not present

## 2016-01-10 DIAGNOSIS — M9904 Segmental and somatic dysfunction of sacral region: Secondary | ICD-10-CM | POA: Diagnosis not present

## 2016-01-20 ENCOUNTER — Ambulatory Visit
Admission: RE | Admit: 2016-01-20 | Discharge: 2016-01-20 | Disposition: A | Discharge status: Home / Self Care | Payer: Medicare Other | Source: Ambulatory Visit | Attending: Internal Medicine | Admitting: Internal Medicine

## 2016-01-20 DIAGNOSIS — R918 Other nonspecific abnormal finding of lung field: Secondary | ICD-10-CM | POA: Diagnosis not present

## 2016-01-20 DIAGNOSIS — R9389 Abnormal findings on diagnostic imaging of other specified body structures: Secondary | ICD-10-CM

## 2016-01-20 MED ORDER — IOPAMIDOL (ISOVUE-300) INJECTION 61%
75.0000 mL | Freq: Once | INTRAVENOUS | Status: AC | PRN
  Administered 2016-01-20: 75 mL via INTRAVENOUS

## 2016-01-24 DIAGNOSIS — M9902 Segmental and somatic dysfunction of thoracic region: Secondary | ICD-10-CM | POA: Diagnosis not present

## 2016-01-24 DIAGNOSIS — M5136 Other intervertebral disc degeneration, lumbar region: Secondary | ICD-10-CM | POA: Diagnosis not present

## 2016-01-24 DIAGNOSIS — M4317 Spondylolisthesis, lumbosacral region: Secondary | ICD-10-CM | POA: Diagnosis not present

## 2016-01-24 DIAGNOSIS — M9903 Segmental and somatic dysfunction of lumbar region: Secondary | ICD-10-CM | POA: Diagnosis not present

## 2016-01-24 DIAGNOSIS — M9904 Segmental and somatic dysfunction of sacral region: Secondary | ICD-10-CM | POA: Diagnosis not present

## 2016-01-24 DIAGNOSIS — M5134 Other intervertebral disc degeneration, thoracic region: Secondary | ICD-10-CM | POA: Diagnosis not present

## 2016-01-25 ENCOUNTER — Other Ambulatory Visit: Payer: Self-pay

## 2016-01-25 DIAGNOSIS — J849 Interstitial pulmonary disease, unspecified: Secondary | ICD-10-CM

## 2016-01-31 DIAGNOSIS — M9903 Segmental and somatic dysfunction of lumbar region: Secondary | ICD-10-CM | POA: Diagnosis not present

## 2016-01-31 DIAGNOSIS — M5134 Other intervertebral disc degeneration, thoracic region: Secondary | ICD-10-CM | POA: Diagnosis not present

## 2016-01-31 DIAGNOSIS — M9902 Segmental and somatic dysfunction of thoracic region: Secondary | ICD-10-CM | POA: Diagnosis not present

## 2016-01-31 DIAGNOSIS — M5136 Other intervertebral disc degeneration, lumbar region: Secondary | ICD-10-CM | POA: Diagnosis not present

## 2016-01-31 DIAGNOSIS — M4317 Spondylolisthesis, lumbosacral region: Secondary | ICD-10-CM | POA: Diagnosis not present

## 2016-01-31 DIAGNOSIS — M9904 Segmental and somatic dysfunction of sacral region: Secondary | ICD-10-CM | POA: Diagnosis not present

## 2016-02-01 DIAGNOSIS — F331 Major depressive disorder, recurrent, moderate: Secondary | ICD-10-CM | POA: Diagnosis not present

## 2016-02-14 ENCOUNTER — Encounter: Payer: Self-pay | Admitting: Internal Medicine

## 2016-02-14 ENCOUNTER — Other Ambulatory Visit (INDEPENDENT_AMBULATORY_CARE_PROVIDER_SITE_OTHER): Payer: Medicare Other

## 2016-02-14 ENCOUNTER — Ambulatory Visit (INDEPENDENT_AMBULATORY_CARE_PROVIDER_SITE_OTHER): Payer: Medicare Other | Admitting: Internal Medicine

## 2016-02-14 VITALS — BP 128/84 | HR 67 | Ht 68.0 in | Wt 183.2 lb

## 2016-02-14 DIAGNOSIS — Z720 Tobacco use: Secondary | ICD-10-CM

## 2016-02-14 DIAGNOSIS — J8410 Pulmonary fibrosis, unspecified: Secondary | ICD-10-CM | POA: Diagnosis not present

## 2016-02-14 DIAGNOSIS — F17210 Nicotine dependence, cigarettes, uncomplicated: Secondary | ICD-10-CM | POA: Diagnosis not present

## 2016-02-14 LAB — SEDIMENTATION RATE: SED RATE: 39 mm/h — AB (ref 0–30)

## 2016-02-14 LAB — BASIC METABOLIC PANEL
BUN: 14 mg/dL (ref 6–23)
CALCIUM: 9.7 mg/dL (ref 8.4–10.5)
CO2: 32 mEq/L (ref 19–32)
CREATININE: 0.93 mg/dL (ref 0.40–1.20)
Chloride: 98 mEq/L (ref 96–112)
GFR: 62.93 mL/min (ref >60.00)
Glucose, Bld: 104 mg/dL — ABNORMAL HIGH (ref 70–99)
Potassium: 3.9 mEq/L (ref 3.5–5.1)
SODIUM: 136 meq/L (ref 135–145)

## 2016-02-14 LAB — CBC WITH DIFFERENTIAL/PLATELET
BASOS ABS: 0 10*3/uL (ref 0.0–0.1)
Basophils Relative: 0.4 % (ref 0.0–3.0)
EOS ABS: 0.1 10*3/uL (ref 0.0–0.7)
Eosinophils Relative: 1.6 % (ref 0.0–5.0)
HEMATOCRIT: 41.2 % (ref 36.0–46.0)
HEMOGLOBIN: 13.7 g/dL (ref 12.0–15.0)
LYMPHS PCT: 30 % (ref 12.0–46.0)
Lymphs Abs: 2.8 10*3/uL (ref 0.7–4.0)
MCHC: 33.3 g/dL (ref 30.0–36.0)
MCV: 88.1 fl (ref 78.0–100.0)
MONOS PCT: 10.5 % (ref 3.0–12.0)
Monocytes Absolute: 1 10*3/uL (ref 0.1–1.0)
NEUTROS PCT: 57.5 % (ref 43.0–77.0)
Neutro Abs: 5.3 10*3/uL (ref 1.4–7.7)
Platelets: 250 10*3/uL (ref 150.0–400.0)
RBC: 4.67 Mil/uL (ref 3.87–5.11)
RDW: 15.1 % (ref 11.5–15.5)
WBC: 9.2 10*3/uL (ref 4.0–10.5)

## 2016-02-14 LAB — RHEUMATOID FACTOR: Rhuematoid fact SerPl-aCnc: <10 IU/mL (ref <=14)

## 2016-02-14 MED ORDER — PANTOPRAZOLE SODIUM 40 MG PO TBEC: 40.0000 mg | DELAYED_RELEASE_TABLET | Freq: Every day | ORAL | Status: DC

## 2016-02-14 MED ORDER — FAMOTIDINE 20 MG PO TABS: ORAL_TABLET | ORAL | Status: DC

## 2016-02-14 NOTE — Progress Notes (Signed)
Subjective:    Patient ID: [name], female    DOB: Dec 25, 1945,   MRN: [id number]  HPI  21 yowf active smoker native of [place] very sedentary but able to walk dogs up some hills at baseline then bad cold in March 2017 and has not recovered with persistent cough > sob so referred to pulmonary clinic 02/14/2016 by Dr Laney Pastor   02/14/2016 1st [facility] office visit/ [name]   Chief Complaint  Patient presents with  . Pulmonary Consult    Referred by Laney Pastor. Pt c/o since April 2017- occ prod with minimal clear sputum. She states that cough bothers her all day, but seems worse in the evening. She states cough sometimes wakes her up in the night.   abruptly ill and some worse since onset of uri in March 2017 cough is a little better overall doe x MMRC3 = can't walk 100 yards even at a slow pace at a flat grade s stopping due to sob    Cough is worse in evening after supper and does keep her up sometimes    No obvious day to day or daytime variability or assoc excess/ purulent sputum or mucus plugs or hemoptysis or cp or chest tightness, subjective wheeze or overt sinus or hb symptoms. No unusual exp hx or h/o childhood pna/ asthma or knowledge of premature birth.  Sleeping ok without nocturnal  or early am exacerbation  of respiratory  c/o's or need for noct saba. Also denies any obvious fluctuation of symptoms with weather or environmental changes or other aggravating or alleviating factors except as outlined above   Current Medications, Allergies, Complete Past Medical History, Past Surgical History, Family History, and Social History were reviewed in [employer] record.       Review of Systems  Constitutional: Negative for fever, chills and unexpected weight change.  HENT: Positive for sore throat. Negative for congestion, dental problem, ear pain, nosebleeds, postnasal drip, rhinorrhea, sinus pressure, sneezing, trouble swallowing and voice change.     Eyes: Negative for visual disturbance.  Respiratory: Positive for cough and shortness of breath. Negative for choking.   Cardiovascular: Positive for leg swelling. Negative for chest pain and palpitations.  Gastrointestinal: Negative for vomiting, abdominal pain and diarrhea.  Genitourinary: Negative for difficulty urinating.  Musculoskeletal: Negative for arthralgias.  Skin: Negative for rash.  Neurological: Negative for tremors, syncope and headaches.  Hematological: Does not bruise/bleed easily.       Objective:   Physical Exam  amb wf nad  Wt Readings from Last 3 Encounters:  02/14/16 183 lb 3.2 oz (83.099 kg)  12/22/15 185 lb (83.915 kg)  08/11/15 185 lb (83.915 kg)    Vital signs reviewed    HEENT: nl dentition, turbinates, and oropharynx. Nl external ear canals without cough reflex   NECK :  without JVD/Nodes/TM/ nl carotid upstrokes bilaterally   LUNGS: no acc muscle use,  Nl contour chest which is clear to A and P bilaterally without cough on insp or exp maneuvers   CV:  RRR  no s3 or murmur or increase in P2, no edema   ABD:  soft and nontender with nl inspiratory excursion in the supine position. No bruits or organomegaly, bowel sounds nl  MS:  Nl gait/ ext warm without deformities, calf tenderness, cyanosis or clubbing No obvious joint restrictions   SKIN: warm and dry without lesions    NEURO:  alert, approp, nl sensorium with  no motor deficits  I personally reviewed images and agree with radiology impression as follows:  CT Chest   01/21/16 1. Basilar predominant interstitial lung disease characterized by subpleural reticulation and mild traction bronchiectasis. No frank honeycombing. Findings at the lung bases are essentially new compared to a 2011 CT abdomen study. 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 2. Scattered solid pulmonary  nodules, largest 6 mm. 3. A follow-up high-resolution chest CT study is recommended in 6 months to follow-up the pulmonary nodules and to reassess the interstitial lung disease. 4. Mild centrilobular and paraseptal emphysema.      Assessment & Plan:

## 2016-02-14 NOTE — Assessment & Plan Note (Signed)
>   3 m Discussed the risks and costs (both direct and indirect)  of smoking relative to the benefits of quitting but patient unwilling to commit at this point to a specific quit date.     

## 2016-02-14 NOTE — Patient Instructions (Addendum)
Pantoprazole (protonix) 40 mg   Take  30-60 min before first meal of the day and Pepcid (famotidine)  20 mg one @  bedtime until return to office - this is the best way to tell whether stomach acid is contributing to your problem.    GERD (REFLUX)  is an extremely common cause of respiratory symptoms just like yours , many times with no obvious heartburn at all.    It can be treated with medication, but also with lifestyle changes including elevation of the head of your bed (ideally with 6 inch  bed blocks),  Smoking cessation, avoidance of late meals, excessive alcohol, and avoid fatty foods, chocolate, peppermint, colas, red wine, and acidic juices such as orange juice.  NO MINT OR MENTHOL PRODUCTS SO NO COUGH DROPS  USE SUGARLESS CANDY INSTEAD (Jolley ranchers or Stover's or Life Savers) or even ice chips will also do - the key is to swallow to prevent all throat clearing. NO OIL BASED VITAMINS - use powdered substitutes.    Please schedule a follow up office visit in 6 weeks, call sooner if needed with pfts when return -ok to push back so get it all done on same day

## 2016-02-14 NOTE — Assessment & Plan Note (Signed)
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   In active  smokers Langerhan's Cell  Histiocyctosis (eosinophilic granuomatosis),  DIP,  and Respiratory Bronchiolitis ILD also need to be considered,  And should be included here  No hx to suggest collagen vasc dz but need to send the labs for this and HSP now  Also, Use of PPI is associated with improved survival time and with decreased radiologic fibrosis per King's study published in AJRCCM vol 184 p1390.  Dec 2011 and also may have other beneficial effects as per the latest review in [place] vol 193 [id number] Jun 20016.  This may not always be cause and effect, but given how universally unimpressive and expensive  all the other  Drugs developed to day  have been for pf,   rec start  rx ppi / diet/ lifestyle modification and f/u with serial walking sats and lung volumes for now to put more points on the curve / establish firm baseline before considering additional measures.   Total time devoted to counseling  = 35/62m review case with pt/ discussion of options/alternatives/ personally creating written instructions  in presence of pt  then going over those specific  Instructions directly with the pt including how to use all of the meds but in particular covering each new medication in detail and the difference between the maintenance/automatic meds and the prns using an action plan format for the latter.

## 2016-02-15 LAB — CYCLIC CITRUL PEPTIDE ANTIBODY, IGG: Cyclic Citrullin Peptide Ab: <16 Units

## 2016-02-15 LAB — ANA: Anti Nuclear Antibody(ANA): NEGATIVE

## 2016-02-18 ENCOUNTER — Institutional Professional Consult (permissible substitution): Payer: Medicare Other | Admitting: Internal Medicine

## 2016-02-20 LAB — HYPERSENSITIVITY PNUEMONITIS PROFILE

## 2016-02-21 ENCOUNTER — Telehealth: Payer: Self-pay | Admitting: Internal Medicine

## 2016-02-21 NOTE — Telephone Encounter (Signed)
Spoke with the pt  She is okay to see MW and have PFT on same day on 04/19/16  She had forgot when he wanted to see her back  Nothing further needed

## 2016-02-22 NOTE — Progress Notes (Signed)
Quick Note:  Spoke with pt and notified of results per Dr. Wert. Pt verbalized understanding and denied any questions.  ______ 

## 2016-02-25 DIAGNOSIS — F331 Major depressive disorder, recurrent, moderate: Secondary | ICD-10-CM | POA: Diagnosis not present

## 2016-03-16 DIAGNOSIS — F331 Major depressive disorder, recurrent, moderate: Secondary | ICD-10-CM | POA: Diagnosis not present

## 2016-03-29 DIAGNOSIS — F331 Major depressive disorder, recurrent, moderate: Secondary | ICD-10-CM | POA: Diagnosis not present

## 2016-04-17 DIAGNOSIS — H6121 Impacted cerumen, right ear: Secondary | ICD-10-CM | POA: Diagnosis not present

## 2016-04-17 DIAGNOSIS — H6242 Otitis externa in other diseases classified elsewhere, left ear: Secondary | ICD-10-CM | POA: Diagnosis not present

## 2016-04-17 DIAGNOSIS — B369 Superficial mycosis, unspecified: Secondary | ICD-10-CM | POA: Diagnosis not present

## 2016-04-19 ENCOUNTER — Ambulatory Visit: Payer: Medicare Other | Admitting: Internal Medicine

## 2016-05-30 DIAGNOSIS — G44219 Episodic tension-type headache, not intractable: Secondary | ICD-10-CM | POA: Diagnosis not present

## 2016-05-30 DIAGNOSIS — D330 Benign neoplasm of brain, supratentorial: Secondary | ICD-10-CM | POA: Diagnosis not present

## 2016-05-30 DIAGNOSIS — R030 Elevated blood-pressure reading, without diagnosis of hypertension: Secondary | ICD-10-CM | POA: Diagnosis not present

## 2016-05-30 DIAGNOSIS — I672 Cerebral atherosclerosis: Secondary | ICD-10-CM | POA: Diagnosis not present

## 2016-07-06 DIAGNOSIS — F331 Major depressive disorder, recurrent, moderate: Secondary | ICD-10-CM | POA: Diagnosis not present

## 2016-07-14 DIAGNOSIS — F331 Major depressive disorder, recurrent, moderate: Secondary | ICD-10-CM | POA: Diagnosis not present

## 2016-08-21 DIAGNOSIS — F331 Major depressive disorder, recurrent, moderate: Secondary | ICD-10-CM | POA: Diagnosis not present

## 2016-08-23 ENCOUNTER — Telehealth: Payer: Self-pay | Admitting: *Deleted

## 2016-08-24 ENCOUNTER — Ambulatory Visit: Payer: Medicare Other | Admitting: Gastroenterology

## 2016-08-24 NOTE — Telephone Encounter (Signed)
No longer a pt here

## 2016-09-19 DIAGNOSIS — F331 Major depressive disorder, recurrent, moderate: Secondary | ICD-10-CM | POA: Diagnosis not present

## 2016-10-10 DIAGNOSIS — F331 Major depressive disorder, recurrent, moderate: Secondary | ICD-10-CM | POA: Diagnosis not present

## 2016-10-13 ENCOUNTER — Ambulatory Visit: Payer: Medicare Other | Admitting: Gastroenterology

## 2016-10-14 DIAGNOSIS — L309 Dermatitis, unspecified: Secondary | ICD-10-CM | POA: Diagnosis not present

## 2016-10-14 DIAGNOSIS — L03032 Cellulitis of left toe: Secondary | ICD-10-CM | POA: Diagnosis not present

## 2016-10-25 DIAGNOSIS — F331 Major depressive disorder, recurrent, moderate: Secondary | ICD-10-CM | POA: Diagnosis not present

## 2016-10-26 DIAGNOSIS — F331 Major depressive disorder, recurrent, moderate: Secondary | ICD-10-CM | POA: Diagnosis not present

## 2016-10-30 DIAGNOSIS — M79675 Pain in left toe(s): Secondary | ICD-10-CM | POA: Diagnosis not present

## 2016-10-30 DIAGNOSIS — S93402A Sprain of unspecified ligament of left ankle, initial encounter: Secondary | ICD-10-CM | POA: Diagnosis not present

## 2016-10-30 DIAGNOSIS — M25572 Pain in left ankle and joints of left foot: Secondary | ICD-10-CM | POA: Diagnosis not present

## 2016-11-03 ENCOUNTER — Telehealth: Payer: Self-pay | Admitting: Cardiovascular Disease

## 2016-11-03 NOTE — Telephone Encounter (Signed)
Received records from [facility] for appointment on 11/08/16 with Dr Gwenlyn Found.  Records put with Dr Kennon Holter schedule for 11/08/16. lp

## 2016-11-08 ENCOUNTER — Ambulatory Visit (INDEPENDENT_AMBULATORY_CARE_PROVIDER_SITE_OTHER): Payer: Medicare Other | Admitting: Cardiovascular Disease

## 2016-11-08 ENCOUNTER — Encounter: Payer: Self-pay | Admitting: Cardiovascular Disease

## 2016-11-08 VITALS — BP 126/84 | HR 62 | Ht 68.0 in | Wt 161.0 lb

## 2016-11-08 DIAGNOSIS — I998 Other disorder of circulatory system: Secondary | ICD-10-CM

## 2016-11-08 DIAGNOSIS — R079 Chest pain, unspecified: Secondary | ICD-10-CM | POA: Diagnosis not present

## 2016-11-08 DIAGNOSIS — F17200 Nicotine dependence, unspecified, uncomplicated: Secondary | ICD-10-CM | POA: Insufficient documentation

## 2016-11-08 DIAGNOSIS — I70229 Atherosclerosis of native arteries of extremities with rest pain, unspecified extremity: Secondary | ICD-10-CM | POA: Insufficient documentation

## 2016-11-08 NOTE — Assessment & Plan Note (Signed)
History of ongoing tobacco abuse of one pack per day with 50-60 pack years.

## 2016-11-08 NOTE — Assessment & Plan Note (Signed)
History of atypical chest pain that occur several times a week with radiation to her neck and upper extremity. She does have risk factors that include 60-pack-years of tobacco abuse. We will obtain a pharmacologic Myoview stress test to risk stratify her.

## 2016-11-08 NOTE — Progress Notes (Signed)
11/08/2016 [name]   10/01/1945  [id number]  Primary Physician Jenny November, MD Primary Cardiologist: Jenny Harp MD [name]  HPI:  [name] is a 71 year old mildly overweight married Caucasian female mother of one, grandmother of 5 grandchildren referred by [name] , her podiatrist for evaluation of critical limb ischemia. Risk factors include 60-pack-years tobacco abuse and family history of heart disease with a brother that died of a myocardial infarction at age 22. She has never had a heart attack or stroke although there is a question of "mini strokes by CT scan followed by a neurologist. She does get chest pain several days a week with radiation to her neck and upper extremity. She barely had a Myoview stress test several years ago that showed no ischemia as well as a 2-D echo that was normal as well. She's had purplish discoloration of her left first second and third toes for several years which apparently have gotten worse. Toes are cool to the touch and she does have weakness in her left leg.   Current Outpatient Prescriptions  Medication Sig Dispense Refill  . bisacodyl (DULCOLAX) 5 MG EC tablet Take 5 mg by mouth daily as needed for constipation.    . calcipotriene-betamethasone (TACLONEX) ointment Apply topically daily.    . clonazePAM (KLONOPIN) 2 MG tablet Take 0.5 mg by mouth at bedtime.     . DULoxetine (CYMBALTA) 30 MG capsule Take 90 mg by mouth daily.     . famotidine (PEPCID) 20 MG tablet One at bedtime 30 tablet 11  . lamoTRIgine (LAMICTAL) 150 MG tablet Take 200 mg by mouth daily.     . magnesium gluconate (MAGONATE) 500 MG tablet Take 1,000 mg by mouth daily.     . nitroGLYCERIN (NITROSTAT) 0.4 MG SL tablet Place 1 tablet (0.4 mg total) under the tongue every 5 (five) minutes as needed for chest pain. 60 tablet 12  . polyethylene glycol (MIRALAX / GLYCOLAX) packet Take 17 g by mouth daily.    Marland Kitchen rOPINIRole (REQUIP) 2 MG tablet TAKE 1  TABLET BY MOUTH AT BEDTIME 30 tablet 0   No current facility-administered medications for this visit.     No Known Allergies  Social History   Social History  . Marital status: Married    Spouse name: N/A  . Number of children: 1  . Years of education: N/A   Occupational History  . retired    Social History Main Topics  . Smoking status: Current Every Day Smoker    Packs/day: 1.00    Years: 60.00    Types: Cigarettes  . Smokeless tobacco: Never Used  . Alcohol use 0.0 oz/week     Comment: yes on occassion  . Drug use: No  . Sexual activity: Not on file   Other Topics Concern  . Not on file   Social History Narrative   Brother in law Mr [name] (one of my patients)   Lives w/ husband         Review of Systems: General: negative for chills, fever, night sweats or weight changes.  Cardiovascular: negative for chest pain, dyspnea on exertion, edema, orthopnea, palpitations, paroxysmal nocturnal dyspnea or shortness of breath Dermatological: negative for rash Respiratory: negative for cough or wheezing Urologic: negative for hematuria Abdominal: negative for nausea, vomiting, diarrhea, bright red blood per rectum, melena, or hematemesis Neurologic: negative for visual changes, syncope, or dizziness All other systems reviewed and are otherwise negative except  as noted above.    Blood pressure 126/84, pulse 62, height 5\' 8"  (1.727 m), weight 161 lb (73 kg).  General appearance: alert and no distress Neck: no adenopathy, no carotid bruit, no JVD, supple, symmetrical, trachea midline and thyroid not enlarged, symmetric, no tenderness/mass/nodules Lungs: clear to auscultation bilaterally Heart: regular rate and rhythm, S1, S2 normal, no murmur, click, rub or gallop Extremities: extremities normal, atraumatic, no cyanosis or edema  EKG sinus rhythm at 62 with septal Q waves and nonspecific ST and T-wave changes. I personally reviewed this EKG.  ASSESSMENT AND  PLAN:   CHEST PAIN History of atypical chest pain that occur several times a week with radiation to her neck and upper extremity. She does have risk factors that include 60-pack-years of tobacco abuse. We will obtain a pharmacologic Myoview stress test to risk stratify her.  Cigarette smoker History of ongoing tobacco abuse of one pack per day with 50-60 pack years.  Critical lower limb ischemia The patient was referred by Dr. Fritzi [name] , her podiatrist, for evaluation of critical limb ischemia. She does have purplish discoloration of her left first second and third toes over the last several years. She also plans a weakness in her left leg. Her left foot is cool to touch. I suspect this is related to chronic arterial insufficiency. We will get lower extremity arterial Doppler studies.      Jenny Harp MD FACP,FACC,FAHA, [facility] 11/08/2016 11:53 AM

## 2016-11-08 NOTE — Patient Instructions (Addendum)
Medication Instructions: Your physician recommends that you continue on your current medications as directed. Please refer to the Current Medication list given to you today.   Testing/Procedures: Your physician has requested that you have a lexiscan myoview this week. For further information please visit [email or web address]. Please follow instruction sheet, as given.  Your physician has requested that you have a lower extremity arterial duplex next week. During this test, ultrasound is used to evaluate arterial blood flow in the legs. Allow one hour for this exam. There are no restrictions or special instructions.  Your physician has requested that you have an ankle brachial index (ABI) next week. During this test an ultrasound and blood pressure cuff are used to evaluate the arteries that supply the arms and legs with blood. Allow thirty minutes for this exam. There are no restrictions or special instructions.  Follow-Up: Your physician recommends that you schedule a follow-up appointment in: 2 weeks with Dr. Gwenlyn Found.  If you need a refill on your cardiac medications before your next appointment, please call your pharmacy.

## 2016-11-08 NOTE — Assessment & Plan Note (Signed)
The patient was referred by Dr. Fritzi Mandes , her podiatrist, for evaluation of critical limb ischemia. She does have purplish discoloration of her left first second and third toes over the last several years. She also plans a weakness in her left leg. Her left foot is cool to touch. I suspect this is related to chronic arterial insufficiency. We will get lower extremity arterial Doppler studies.

## 2016-11-09 ENCOUNTER — Telehealth (HOSPITAL_COMMUNITY): Payer: Self-pay

## 2016-11-09 NOTE — Telephone Encounter (Signed)
Encounter complete. 

## 2016-11-10 ENCOUNTER — Ambulatory Visit (HOSPITAL_COMMUNITY)
Admission: RE | Admit: 2016-11-10 | Discharge: 2016-11-10 | Disposition: A | Discharge status: Home / Self Care | Payer: Medicare Other | Source: Ambulatory Visit | Attending: Cardiovascular Disease | Admitting: Cardiovascular Disease

## 2016-11-10 DIAGNOSIS — I998 Other disorder of circulatory system: Secondary | ICD-10-CM | POA: Diagnosis not present

## 2016-11-10 DIAGNOSIS — I10 Essential (primary) hypertension: Secondary | ICD-10-CM | POA: Insufficient documentation

## 2016-11-10 DIAGNOSIS — I70229 Atherosclerosis of native arteries of extremities with rest pain, unspecified extremity: Secondary | ICD-10-CM

## 2016-11-10 DIAGNOSIS — R079 Chest pain, unspecified: Secondary | ICD-10-CM

## 2016-11-10 DIAGNOSIS — Z8673 Personal history of transient ischemic attack (TIA), and cerebral infarction without residual deficits: Secondary | ICD-10-CM | POA: Diagnosis not present

## 2016-11-10 DIAGNOSIS — Z720 Tobacco use: Secondary | ICD-10-CM | POA: Insufficient documentation

## 2016-11-14 ENCOUNTER — Telehealth (HOSPITAL_COMMUNITY): Payer: Self-pay

## 2016-11-14 NOTE — Telephone Encounter (Signed)
Encounter complete. 

## 2016-11-15 DIAGNOSIS — S93402A Sprain of unspecified ligament of left ankle, initial encounter: Secondary | ICD-10-CM | POA: Diagnosis not present

## 2016-11-15 DIAGNOSIS — M2011 Hallux valgus (acquired), right foot: Secondary | ICD-10-CM | POA: Diagnosis not present

## 2016-11-15 DIAGNOSIS — M79671 Pain in right foot: Secondary | ICD-10-CM | POA: Diagnosis not present

## 2016-11-15 DIAGNOSIS — M2012 Hallux valgus (acquired), left foot: Secondary | ICD-10-CM | POA: Diagnosis not present

## 2016-11-16 ENCOUNTER — Ambulatory Visit (HOSPITAL_COMMUNITY)
Admission: RE | Admit: 2016-11-16 | Discharge: 2016-11-16 | Disposition: A | Discharge status: Home / Self Care | Payer: Medicare Other | Source: Ambulatory Visit | Attending: Internal Medicine | Admitting: Internal Medicine

## 2016-11-16 DIAGNOSIS — R079 Chest pain, unspecified: Secondary | ICD-10-CM | POA: Insufficient documentation

## 2016-11-16 LAB — MYOCARDIAL PERFUSION IMAGING
CHL CUP NUCLEAR SSS: 7
LVDIAVOL: 80 mL (ref 46–106)
LVSYSVOL: 24 mL
Peak HR: 76 {beats}/min
Rest HR: 59 {beats}/min
SDS: 4
SRS: 3
TID: 1.17

## 2016-11-16 MED ORDER — AMINOPHYLLINE 25 MG/ML IV SOLN
125.0000 mg | Freq: Once | INTRAVENOUS | Status: AC
Start: 2016-11-16 — End: 2016-11-16
  Administered 2016-11-16: 125 mg via INTRAVENOUS

## 2016-11-16 MED ORDER — REGADENOSON 0.4 MG/5ML IV SOLN
0.4000 mg | Freq: Once | INTRAVENOUS | Status: AC
  Administered 2016-11-16: 0.4 mg via INTRAVENOUS

## 2016-11-16 MED ORDER — TECHNETIUM TC 99M TETROFOSMIN IV KIT
30.8000 | PACK | Freq: Once | INTRAVENOUS | Status: AC | PRN
  Administered 2016-11-16: 30.8 via INTRAVENOUS
  Filled 2016-11-16: qty 31

## 2016-11-16 MED ORDER — TECHNETIUM TC 99M TETROFOSMIN IV KIT
9.6000 | PACK | Freq: Once | INTRAVENOUS | Status: AC | PRN
  Administered 2016-11-16: 9.6 via INTRAVENOUS
  Filled 2016-11-16: qty 10

## 2016-11-17 ENCOUNTER — Encounter: Payer: Self-pay | Admitting: Cardiovascular Disease

## 2016-11-17 ENCOUNTER — Ambulatory Visit (INDEPENDENT_AMBULATORY_CARE_PROVIDER_SITE_OTHER): Payer: Medicare Other | Admitting: Cardiovascular Disease

## 2016-11-17 DIAGNOSIS — I998 Other disorder of circulatory system: Secondary | ICD-10-CM

## 2016-11-17 DIAGNOSIS — I208 Other forms of angina pectoris: Secondary | ICD-10-CM

## 2016-11-17 DIAGNOSIS — I70229 Atherosclerosis of native arteries of extremities with rest pain, unspecified extremity: Secondary | ICD-10-CM

## 2016-11-17 DIAGNOSIS — I209 Angina pectoris, unspecified: Secondary | ICD-10-CM | POA: Diagnosis not present

## 2016-11-17 MED ORDER — AMLODIPINE BESYLATE 2.5 MG PO TABS: 2.5000 mg | ORAL_TABLET | Freq: Every day | ORAL | 3 refills | Status: DC

## 2016-11-17 NOTE — Progress Notes (Signed)
[name] returns today for follow-up for outpatient noninvasive test. Her Myoview is low risk and her Dopplers showed normal ABIs with abnormal PPG is on the left suggesting "small vessel disease. I suspect she has "Raynaud's phenomenon". We discussed smoking cessation. I'm also going to begin her on low-dose amlodipine and will see her back in several months for follow-up.

## 2016-11-17 NOTE — Patient Instructions (Signed)
Medication Instructions: START Amlodipine 2.5 mg daily.   Follow-Up: Your physician recommends that you schedule a follow-up appointment in: 4 months with Dr. Gwenlyn Found.  If you need a refill on your cardiac medications before your next appointment, please call your pharmacy.

## 2016-11-17 NOTE — Assessment & Plan Note (Signed)
History of purplish discoloration and left for second and third toes. Recent Dopplers performed 11/10/16 revealed normal ABIs bilaterally with abnormal PPG's on the left. This may represent "Raynaud's phenomenon". I advised her to stop smoking and we will begin low-dose amlodipine.

## 2016-11-17 NOTE — Assessment & Plan Note (Signed)
History of chest pain with recent Myoview that was low risk.

## 2016-11-21 DIAGNOSIS — D330 Benign neoplasm of brain, supratentorial: Secondary | ICD-10-CM | POA: Diagnosis not present

## 2016-11-21 DIAGNOSIS — R51 Headache: Secondary | ICD-10-CM | POA: Diagnosis not present

## 2016-11-21 DIAGNOSIS — M5417 Radiculopathy, lumbosacral region: Secondary | ICD-10-CM | POA: Diagnosis not present

## 2016-11-21 DIAGNOSIS — R030 Elevated blood-pressure reading, without diagnosis of hypertension: Secondary | ICD-10-CM | POA: Diagnosis not present

## 2016-11-21 DIAGNOSIS — I70245 Atherosclerosis of native arteries of left leg with ulceration of other part of foot: Secondary | ICD-10-CM | POA: Diagnosis not present

## 2016-11-21 DIAGNOSIS — I672 Cerebral atherosclerosis: Secondary | ICD-10-CM | POA: Diagnosis not present

## 2016-11-21 DIAGNOSIS — G44219 Episodic tension-type headache, not intractable: Secondary | ICD-10-CM | POA: Diagnosis not present

## 2016-11-21 DIAGNOSIS — M545 Low back pain: Secondary | ICD-10-CM | POA: Diagnosis not present

## 2016-11-24 ENCOUNTER — Encounter (HOSPITAL_COMMUNITY): Payer: Medicare Other

## 2016-11-29 ENCOUNTER — Other Ambulatory Visit: Payer: Self-pay | Admitting: Neurology

## 2016-11-29 DIAGNOSIS — M2011 Hallux valgus (acquired), right foot: Secondary | ICD-10-CM | POA: Diagnosis not present

## 2016-11-29 DIAGNOSIS — L84 Corns and callosities: Secondary | ICD-10-CM | POA: Diagnosis not present

## 2016-11-29 DIAGNOSIS — M5416 Radiculopathy, lumbar region: Secondary | ICD-10-CM

## 2016-11-29 DIAGNOSIS — M2041 Other hammer toe(s) (acquired), right foot: Secondary | ICD-10-CM | POA: Diagnosis not present

## 2016-11-29 DIAGNOSIS — M79675 Pain in left toe(s): Secondary | ICD-10-CM | POA: Diagnosis not present

## 2016-12-15 DIAGNOSIS — R51 Headache: Secondary | ICD-10-CM | POA: Diagnosis not present

## 2016-12-15 DIAGNOSIS — G5732 Lesion of lateral popliteal nerve, left lower limb: Secondary | ICD-10-CM | POA: Diagnosis not present

## 2016-12-15 DIAGNOSIS — G44219 Episodic tension-type headache, not intractable: Secondary | ICD-10-CM | POA: Diagnosis not present

## 2016-12-15 DIAGNOSIS — D330 Benign neoplasm of brain, supratentorial: Secondary | ICD-10-CM | POA: Diagnosis not present

## 2016-12-15 DIAGNOSIS — R030 Elevated blood-pressure reading, without diagnosis of hypertension: Secondary | ICD-10-CM | POA: Diagnosis not present

## 2016-12-17 ENCOUNTER — Ambulatory Visit
Admission: RE | Admit: 2016-12-17 | Discharge: 2016-12-17 | Disposition: A | Discharge status: Home / Self Care | Payer: Medicare Other | Source: Ambulatory Visit | Attending: Neurology | Admitting: Neurology

## 2016-12-17 DIAGNOSIS — M48061 Spinal stenosis, lumbar region without neurogenic claudication: Secondary | ICD-10-CM | POA: Diagnosis not present

## 2016-12-17 DIAGNOSIS — M5416 Radiculopathy, lumbar region: Secondary | ICD-10-CM

## 2016-12-26 ENCOUNTER — Telehealth: Payer: Self-pay | Admitting: Cardiovascular Disease

## 2016-12-26 NOTE — Telephone Encounter (Signed)
New message    Pt is calling because she says her toes are turning blue and she has no circulation. She scheduled an appt on May 2nd at 2pm.

## 2016-12-26 NOTE — Telephone Encounter (Signed)
Left pt msg to call. 

## 2016-12-26 NOTE — Telephone Encounter (Signed)
Pt of Dr. Gwenlyn Found Seen recently 11/17/16  From Dr. Kennon Holter notes: "History of purplish discoloration and left for second and third toes. Recent Dopplers performed 11/10/16 revealed normal ABIs bilaterally with abnormal PPG's on the left. This may represent "Raynaud's phenomenon". I advised her to stop smoking and we will begin low-dose amlodipine."  Pt called today to report purplish discoloration in left foot which has spread to a 3rd toe. She also identifies reddish discoloration on top of this foot, and persistent "numbness" on sole of foot for 1 week.  She called to schedule sooner return OV and was set up for a May 2nd appt. Patient identifies no other concerns at this time. Denies edema, pain, swelling, tingling.   Pt notes compliance w medication regimen. She voices affirmation of smoking reduction (but not cessation).  Pt aware Dr. Kennon Holter schedule full until May 2nd but I would seek advice-- can you advise if pt needs to be seen sooner and if so, is it OK to book on full schedule?

## 2016-12-26 NOTE — Telephone Encounter (Signed)
Returning your call. °

## 2016-12-27 NOTE — Telephone Encounter (Signed)
Can be put on a mid-level provider schedule an angiogram in the office

## 2016-12-27 NOTE — Telephone Encounter (Signed)
I spoke w patient and communicated advise for APP visit. Pt has declined need for this for now, will keep scheduled visit w Dr. Gwenlyn Found. . She's aware to call if she elects to see APP sooner or has new concerns.

## 2017-01-08 DIAGNOSIS — R112 Nausea with vomiting, unspecified: Secondary | ICD-10-CM | POA: Diagnosis not present

## 2017-01-08 DIAGNOSIS — S069X0A Unspecified intracranial injury without loss of consciousness, initial encounter: Secondary | ICD-10-CM | POA: Diagnosis not present

## 2017-01-08 DIAGNOSIS — G5732 Lesion of lateral popliteal nerve, left lower limb: Secondary | ICD-10-CM | POA: Diagnosis not present

## 2017-01-08 DIAGNOSIS — M545 Low back pain: Secondary | ICD-10-CM | POA: Diagnosis not present

## 2017-01-08 DIAGNOSIS — G44319 Acute post-traumatic headache, not intractable: Secondary | ICD-10-CM | POA: Diagnosis not present

## 2017-01-09 DIAGNOSIS — M5417 Radiculopathy, lumbosacral region: Secondary | ICD-10-CM | POA: Diagnosis not present

## 2017-01-09 DIAGNOSIS — R2681 Unsteadiness on feet: Secondary | ICD-10-CM | POA: Diagnosis not present

## 2017-01-09 DIAGNOSIS — M545 Low back pain: Secondary | ICD-10-CM | POA: Diagnosis not present

## 2017-01-09 DIAGNOSIS — M2560 Stiffness of unspecified joint, not elsewhere classified: Secondary | ICD-10-CM | POA: Diagnosis not present

## 2017-01-10 ENCOUNTER — Other Ambulatory Visit: Payer: Self-pay | Admitting: Neurology

## 2017-01-10 ENCOUNTER — Ambulatory Visit: Payer: Medicare Other | Admitting: Cardiovascular Disease

## 2017-01-10 DIAGNOSIS — R110 Nausea: Secondary | ICD-10-CM

## 2017-01-10 DIAGNOSIS — R51 Headache: Secondary | ICD-10-CM

## 2017-01-10 DIAGNOSIS — R519 Headache, unspecified: Secondary | ICD-10-CM

## 2017-01-10 DIAGNOSIS — R42 Dizziness and giddiness: Secondary | ICD-10-CM

## 2017-01-11 ENCOUNTER — Other Ambulatory Visit: Payer: Medicare Other

## 2017-01-15 ENCOUNTER — Ambulatory Visit
Admission: RE | Admit: 2017-01-15 | Discharge: 2017-01-15 | Disposition: A | Discharge status: Home / Self Care | Payer: Medicare Other | Source: Ambulatory Visit | Attending: Neurology | Admitting: Neurology

## 2017-01-15 DIAGNOSIS — R110 Nausea: Secondary | ICD-10-CM

## 2017-01-15 DIAGNOSIS — R42 Dizziness and giddiness: Secondary | ICD-10-CM

## 2017-01-15 DIAGNOSIS — R519 Headache, unspecified: Secondary | ICD-10-CM

## 2017-01-15 DIAGNOSIS — D329 Benign neoplasm of meninges, unspecified: Secondary | ICD-10-CM | POA: Diagnosis not present

## 2017-01-15 DIAGNOSIS — R51 Headache: Secondary | ICD-10-CM

## 2017-01-15 DIAGNOSIS — S0990XA Unspecified injury of head, initial encounter: Secondary | ICD-10-CM | POA: Diagnosis not present

## 2017-01-17 DIAGNOSIS — H52223 Regular astigmatism, bilateral: Secondary | ICD-10-CM | POA: Diagnosis not present

## 2017-01-17 DIAGNOSIS — H35342 Macular cyst, hole, or pseudohole, left eye: Secondary | ICD-10-CM | POA: Diagnosis not present

## 2017-01-17 DIAGNOSIS — Z961 Presence of intraocular lens: Secondary | ICD-10-CM | POA: Diagnosis not present

## 2017-01-17 DIAGNOSIS — H43813 Vitreous degeneration, bilateral: Secondary | ICD-10-CM | POA: Diagnosis not present

## 2017-01-17 DIAGNOSIS — H43311 Vitreous membranes and strands, right eye: Secondary | ICD-10-CM | POA: Diagnosis not present

## 2017-01-17 DIAGNOSIS — H35372 Puckering of macula, left eye: Secondary | ICD-10-CM | POA: Diagnosis not present

## 2017-02-08 DIAGNOSIS — G5732 Lesion of lateral popliteal nerve, left lower limb: Secondary | ICD-10-CM | POA: Diagnosis not present

## 2017-02-08 DIAGNOSIS — M545 Low back pain: Secondary | ICD-10-CM | POA: Diagnosis not present

## 2017-02-08 DIAGNOSIS — I672 Cerebral atherosclerosis: Secondary | ICD-10-CM | POA: Diagnosis not present

## 2017-02-08 DIAGNOSIS — D330 Benign neoplasm of brain, supratentorial: Secondary | ICD-10-CM | POA: Diagnosis not present

## 2017-02-08 DIAGNOSIS — R2681 Unsteadiness on feet: Secondary | ICD-10-CM | POA: Diagnosis not present

## 2017-02-20 DIAGNOSIS — F331 Major depressive disorder, recurrent, moderate: Secondary | ICD-10-CM | POA: Diagnosis not present

## 2017-03-13 DIAGNOSIS — F331 Major depressive disorder, recurrent, moderate: Secondary | ICD-10-CM | POA: Diagnosis not present

## 2017-04-09 ENCOUNTER — Other Ambulatory Visit: Payer: Self-pay

## 2017-05-02 DIAGNOSIS — F331 Major depressive disorder, recurrent, moderate: Secondary | ICD-10-CM | POA: Diagnosis not present

## 2017-05-22 DIAGNOSIS — F331 Major depressive disorder, recurrent, moderate: Secondary | ICD-10-CM | POA: Diagnosis not present

## 2017-06-12 DIAGNOSIS — F331 Major depressive disorder, recurrent, moderate: Secondary | ICD-10-CM | POA: Diagnosis not present

## 2017-07-19 DIAGNOSIS — F331 Major depressive disorder, recurrent, moderate: Secondary | ICD-10-CM | POA: Diagnosis not present

## 2017-07-27 DIAGNOSIS — E559 Vitamin D deficiency, unspecified: Secondary | ICD-10-CM | POA: Diagnosis not present

## 2017-07-27 DIAGNOSIS — Z79899 Other long term (current) drug therapy: Secondary | ICD-10-CM | POA: Diagnosis not present

## 2017-07-27 DIAGNOSIS — G3184 Mild cognitive impairment, so stated: Secondary | ICD-10-CM | POA: Diagnosis not present

## 2017-07-27 DIAGNOSIS — F331 Major depressive disorder, recurrent, moderate: Secondary | ICD-10-CM | POA: Diagnosis not present

## 2017-07-27 DIAGNOSIS — R5383 Other fatigue: Secondary | ICD-10-CM | POA: Diagnosis not present

## 2017-08-07 DIAGNOSIS — F331 Major depressive disorder, recurrent, moderate: Secondary | ICD-10-CM | POA: Diagnosis not present

## 2017-10-02 DIAGNOSIS — F331 Major depressive disorder, recurrent, moderate: Secondary | ICD-10-CM | POA: Diagnosis not present

## 2017-10-23 ENCOUNTER — Emergency Department (HOSPITAL_COMMUNITY): Payer: Medicare Other

## 2017-10-23 ENCOUNTER — Encounter (HOSPITAL_COMMUNITY): Payer: Self-pay

## 2017-10-23 ENCOUNTER — Other Ambulatory Visit: Payer: Self-pay

## 2017-10-23 ENCOUNTER — Inpatient Hospital Stay (HOSPITAL_COMMUNITY)
Admission: EM | Admit: 2017-10-23 | Discharge: 2017-10-25 | DRG: 066 | Disposition: A | Discharge status: Home / Self Care | Payer: Medicare Other | Attending: Internal Medicine | Admitting: Internal Medicine

## 2017-10-23 DIAGNOSIS — M6281 Muscle weakness (generalized): Secondary | ICD-10-CM | POA: Diagnosis not present

## 2017-10-23 DIAGNOSIS — R4701 Aphasia: Secondary | ICD-10-CM | POA: Diagnosis not present

## 2017-10-23 DIAGNOSIS — I6339 Cerebral infarction due to thrombosis of other cerebral artery: Secondary | ICD-10-CM

## 2017-10-23 DIAGNOSIS — E039 Hypothyroidism, unspecified: Secondary | ICD-10-CM | POA: Diagnosis present

## 2017-10-23 DIAGNOSIS — F17200 Nicotine dependence, unspecified, uncomplicated: Secondary | ICD-10-CM | POA: Diagnosis present

## 2017-10-23 DIAGNOSIS — Z8673 Personal history of transient ischemic attack (TIA), and cerebral infarction without residual deficits: Secondary | ICD-10-CM | POA: Diagnosis not present

## 2017-10-23 DIAGNOSIS — R200 Anesthesia of skin: Secondary | ICD-10-CM | POA: Diagnosis not present

## 2017-10-23 DIAGNOSIS — Z720 Tobacco use: Secondary | ICD-10-CM | POA: Diagnosis not present

## 2017-10-23 DIAGNOSIS — Z79899 Other long term (current) drug therapy: Secondary | ICD-10-CM | POA: Diagnosis not present

## 2017-10-23 DIAGNOSIS — F40240 Claustrophobia: Secondary | ICD-10-CM | POA: Diagnosis present

## 2017-10-23 DIAGNOSIS — R531 Weakness: Secondary | ICD-10-CM | POA: Diagnosis not present

## 2017-10-23 DIAGNOSIS — G459 Transient cerebral ischemic attack, unspecified: Secondary | ICD-10-CM

## 2017-10-23 DIAGNOSIS — F17210 Nicotine dependence, cigarettes, uncomplicated: Secondary | ICD-10-CM | POA: Diagnosis present

## 2017-10-23 DIAGNOSIS — I2510 Atherosclerotic heart disease of native coronary artery without angina pectoris: Secondary | ICD-10-CM | POA: Diagnosis present

## 2017-10-23 DIAGNOSIS — R471 Dysarthria and anarthria: Secondary | ICD-10-CM | POA: Diagnosis present

## 2017-10-23 DIAGNOSIS — I639 Cerebral infarction, unspecified: Principal | ICD-10-CM | POA: Diagnosis present

## 2017-10-23 DIAGNOSIS — R7303 Prediabetes: Secondary | ICD-10-CM | POA: Diagnosis present

## 2017-10-23 DIAGNOSIS — Z7982 Long term (current) use of aspirin: Secondary | ICD-10-CM | POA: Diagnosis not present

## 2017-10-23 DIAGNOSIS — R270 Ataxia, unspecified: Secondary | ICD-10-CM | POA: Diagnosis present

## 2017-10-23 DIAGNOSIS — E785 Hyperlipidemia, unspecified: Secondary | ICD-10-CM | POA: Diagnosis present

## 2017-10-23 DIAGNOSIS — R079 Chest pain, unspecified: Secondary | ICD-10-CM | POA: Diagnosis not present

## 2017-10-23 DIAGNOSIS — R4781 Slurred speech: Secondary | ICD-10-CM | POA: Diagnosis not present

## 2017-10-23 DIAGNOSIS — D329 Benign neoplasm of meninges, unspecified: Secondary | ICD-10-CM | POA: Diagnosis not present

## 2017-10-23 DIAGNOSIS — I700 Atherosclerosis of aorta: Secondary | ICD-10-CM | POA: Diagnosis present

## 2017-10-23 DIAGNOSIS — F319 Bipolar disorder, unspecified: Secondary | ICD-10-CM | POA: Diagnosis present

## 2017-10-23 DIAGNOSIS — K219 Gastro-esophageal reflux disease without esophagitis: Secondary | ICD-10-CM | POA: Diagnosis present

## 2017-10-23 DIAGNOSIS — I10 Essential (primary) hypertension: Secondary | ICD-10-CM | POA: Diagnosis not present

## 2017-10-23 DIAGNOSIS — I6789 Other cerebrovascular disease: Secondary | ICD-10-CM | POA: Diagnosis not present

## 2017-10-23 DIAGNOSIS — R29810 Facial weakness: Secondary | ICD-10-CM | POA: Diagnosis not present

## 2017-10-23 DIAGNOSIS — R51 Headache: Secondary | ICD-10-CM | POA: Diagnosis not present

## 2017-10-23 DIAGNOSIS — R29818 Other symptoms and signs involving the nervous system: Secondary | ICD-10-CM | POA: Diagnosis not present

## 2017-10-23 DIAGNOSIS — Z86011 Personal history of benign neoplasm of the brain: Secondary | ICD-10-CM | POA: Diagnosis not present

## 2017-10-23 DIAGNOSIS — I63312 Cerebral infarction due to thrombosis of left middle cerebral artery: Secondary | ICD-10-CM | POA: Diagnosis not present

## 2017-10-23 DIAGNOSIS — R29703 NIHSS score 3: Secondary | ICD-10-CM | POA: Diagnosis present

## 2017-10-23 LAB — COMPREHENSIVE METABOLIC PANEL
ALBUMIN: 3.5 g/dL (ref 3.5–5.0)
ALT: 19 U/L (ref 14–54)
AST: 21 U/L (ref 15–41)
Alkaline Phosphatase: 65 U/L (ref 38–126)
Anion gap: 10 (ref 5–15)
BILIRUBIN TOTAL: 0.4 mg/dL (ref 0.3–1.2)
BUN: 15 mg/dL (ref 6–20)
CO2: 27 mmol/L (ref 22–32)
CREATININE: 0.82 mg/dL (ref 0.44–1.00)
Calcium: 9.2 mg/dL (ref 8.9–10.3)
Chloride: 99 mmol/L — ABNORMAL LOW (ref 101–111)
GFR calc Af Amer: >60 mL/min (ref >60)
GFR calc non Af Amer: >60 mL/min (ref >60)
GLUCOSE: 102 mg/dL — AB (ref 65–99)
Potassium: 3.8 mmol/L (ref 3.5–5.1)
Sodium: 136 mmol/L (ref 135–145)
TOTAL PROTEIN: 6.5 g/dL (ref 6.5–8.1)

## 2017-10-23 LAB — CBG MONITORING, ED: Glucose-Capillary: 93 mg/dL (ref 65–99)

## 2017-10-23 LAB — I-STAT CHEM 8, ED
BUN: 18 mg/dL (ref 6–20)
CREATININE: 0.8 mg/dL (ref 0.44–1.00)
Calcium, Ion: 1.13 mmol/L — ABNORMAL LOW (ref 1.15–1.40)
Chloride: 96 mmol/L — ABNORMAL LOW (ref 101–111)
GLUCOSE: 93 mg/dL (ref 65–99)
HEMATOCRIT: 40 % (ref 36.0–46.0)
Hemoglobin: 13.6 g/dL (ref 12.0–15.0)
Potassium: 3.8 mmol/L (ref 3.5–5.1)
Sodium: 139 mmol/L (ref 135–145)
TCO2: 33 mmol/L — AB (ref 22–32)

## 2017-10-23 LAB — APTT: APTT: 34 s (ref 24–36)

## 2017-10-23 LAB — DIFFERENTIAL
Basophils Absolute: 0 10*3/uL (ref 0.0–0.1)
Basophils Relative: 0 %
Eosinophils Absolute: 0.2 10*3/uL (ref 0.0–0.7)
Eosinophils Relative: 2 %
LYMPHS ABS: 2.7 10*3/uL (ref 0.7–4.0)
LYMPHS PCT: 36 %
Monocytes Absolute: 0.6 10*3/uL (ref 0.1–1.0)
Monocytes Relative: 9 %
NEUTROS PCT: 53 %
Neutro Abs: 4 10*3/uL (ref 1.7–7.7)

## 2017-10-23 LAB — ETHANOL: Alcohol, Ethyl (B): <10 mg/dL (ref <10)

## 2017-10-23 LAB — CBC
HEMATOCRIT: 39.7 % (ref 36.0–46.0)
HEMATOCRIT: 39.8 % (ref 36.0–46.0)
HEMOGLOBIN: 13 g/dL (ref 12.0–15.0)
HEMOGLOBIN: 13 g/dL (ref 12.0–15.0)
MCH: 30.6 pg (ref 26.0–34.0)
MCH: 30.3 pg (ref 26.0–34.0)
MCHC: 32.7 g/dL (ref 30.0–36.0)
MCHC: 32.7 g/dL (ref 30.0–36.0)
MCV: 93.4 fL (ref 78.0–100.0)
MCV: 92.8 fL (ref 78.0–100.0)
Platelets: 202 10*3/uL (ref 150–400)
Platelets: 204 10*3/uL (ref 150–400)
RBC: 4.25 MIL/uL (ref 3.87–5.11)
RBC: 4.29 MIL/uL (ref 3.87–5.11)
RDW: 13.7 % (ref 11.5–15.5)
RDW: 13.7 % (ref 11.5–15.5)
WBC: 7.6 10*3/uL (ref 4.0–10.5)
WBC: 7.2 10*3/uL (ref 4.0–10.5)

## 2017-10-23 LAB — URINALYSIS, ROUTINE W REFLEX MICROSCOPIC
Bacteria, UA: NONE SEEN
Bilirubin Urine: NEGATIVE
GLUCOSE, UA: NEGATIVE mg/dL
KETONES UR: NEGATIVE mg/dL
LEUKOCYTES UA: NEGATIVE
NITRITE: NEGATIVE
PH: 7 (ref 5.0–8.0)
Protein, ur: NEGATIVE mg/dL
Specific Gravity, Urine: 1.004 — ABNORMAL LOW (ref 1.005–1.030)

## 2017-10-23 LAB — PROTIME-INR
INR: 1.01
Prothrombin Time: 13.2 seconds (ref 11.4–15.2)

## 2017-10-23 LAB — I-STAT TROPONIN, ED: Troponin i, poc: 0.01 ng/mL (ref 0.00–0.08)

## 2017-10-23 LAB — CREATININE, SERUM
Creatinine, Ser: 0.9 mg/dL (ref 0.44–1.00)
GFR calc Af Amer: >60 mL/min (ref >60)
GFR calc non Af Amer: >60 mL/min (ref >60)

## 2017-10-23 LAB — RAPID URINE DRUG SCREEN, HOSP PERFORMED
Amphetamines: NOT DETECTED
BARBITURATES: NOT DETECTED
Benzodiazepines: NOT DETECTED
Cocaine: NOT DETECTED
Opiates: NOT DETECTED
TETRAHYDROCANNABINOL: NOT DETECTED

## 2017-10-23 MED ORDER — CLONAZEPAM 0.5 MG PO TABS
0.5000 mg | ORAL_TABLET | Freq: Every day | ORAL | Status: DC
  Administered 2017-10-23 – 2017-10-24 (×2): 0.5 mg via ORAL
  Filled 2017-10-23 (×2): qty 1

## 2017-10-23 MED ORDER — ACETAMINOPHEN 650 MG RE SUPP: 650.0000 mg | RECTAL | Status: DC | PRN

## 2017-10-23 MED ORDER — ACETAMINOPHEN 160 MG/5ML PO SOLN: 650.0000 mg | ORAL | Status: DC | PRN

## 2017-10-23 MED ORDER — AMLODIPINE BESYLATE 2.5 MG PO TABS
2.5000 mg | ORAL_TABLET | Freq: Every day | ORAL | Status: DC
  Administered 2017-10-23 – 2017-10-24 (×2): 2.5 mg via ORAL
  Filled 2017-10-23 (×2): qty 1

## 2017-10-23 MED ORDER — SENNA 8.6 MG PO TABS
1.0000 | ORAL_TABLET | Freq: Every day | ORAL | Status: DC
  Administered 2017-10-23 – 2017-10-24 (×2): 8.6 mg via ORAL
  Filled 2017-10-23 (×2): qty 1

## 2017-10-23 MED ORDER — FAMOTIDINE 20 MG PO TABS
20.0000 mg | ORAL_TABLET | Freq: Every day | ORAL | Status: DC
  Administered 2017-10-24: 20 mg via ORAL
  Filled 2017-10-23: qty 1

## 2017-10-23 MED ORDER — STROKE: EARLY STAGES OF RECOVERY BOOK
Freq: Once | Status: AC
  Administered 2017-10-23: 21:00:00

## 2017-10-23 MED ORDER — TRAMADOL HCL 50 MG PO TABS: 50.0000 mg | ORAL_TABLET | Freq: Once | ORAL | Status: DC

## 2017-10-23 MED ORDER — POLYETHYLENE GLYCOL 3350 17 G PO PACK
17.0000 g | PACK | Freq: Every day | ORAL | Status: DC
  Administered 2017-10-23 – 2017-10-25 (×3): 17 g via ORAL
  Filled 2017-10-23 (×3): qty 1

## 2017-10-23 MED ORDER — LAMOTRIGINE 100 MG PO TABS
100.0000 mg | ORAL_TABLET | Freq: Every day | ORAL | Status: DC
  Administered 2017-10-24 – 2017-10-25 (×2): 100 mg via ORAL
  Filled 2017-10-23 (×2): qty 1

## 2017-10-23 MED ORDER — ROPINIROLE HCL 1 MG PO TABS
2.0000 mg | ORAL_TABLET | Freq: Every day | ORAL | Status: DC
  Administered 2017-10-23 – 2017-10-24 (×2): 2 mg via ORAL
  Filled 2017-10-23 (×3): qty 2

## 2017-10-23 MED ORDER — DULOXETINE HCL 60 MG PO CPEP
60.0000 mg | ORAL_CAPSULE | Freq: Every day | ORAL | Status: DC
  Administered 2017-10-24 – 2017-10-25 (×2): 60 mg via ORAL
  Filled 2017-10-23 (×2): qty 1

## 2017-10-23 MED ORDER — ACETAMINOPHEN 325 MG PO TABS: 650.0000 mg | ORAL_TABLET | ORAL | Status: DC | PRN

## 2017-10-23 MED ORDER — LABETALOL HCL 5 MG/ML IV SOLN: 5.0000 mg | INTRAVENOUS | Status: DC | PRN

## 2017-10-23 MED ORDER — MAGNESIUM GLUCONATE 500 MG PO TABS
1000.0000 mg | ORAL_TABLET | Freq: Every day | ORAL | Status: DC
  Administered 2017-10-23 – 2017-10-25 (×3): 1000 mg via ORAL
  Filled 2017-10-23 (×4): qty 2

## 2017-10-23 MED ORDER — HEPARIN SODIUM (PORCINE) 5000 UNIT/ML IJ SOLN
5000.0000 [IU] | Freq: Three times a day (TID) | INTRAMUSCULAR | Status: DC
  Administered 2017-10-23 – 2017-10-25 (×5): 5000 [IU] via SUBCUTANEOUS
  Filled 2017-10-23 (×6): qty 1

## 2017-10-23 NOTE — H&P (Addendum)
[facility]                                                                                    Patient Demographics  [name], is a 72 y.o. female  CSN: [id number]  MRN: [id number]  DOB - Apr 03, 1946  Admit Date - 10/23/2017  Outpatient Primary MD for the patient is Colon Branch, MD   With History of -  Past Medical History:  Diagnosis Date  . Bipolar disorder ([place])   . CAD (coronary artery disease)    intra and extracranial vascular dx per MRI 4/11, neurology rec strict CVRF control  . Colonic inertia   . Constipation    chronic;severe  . Depression   . Duodenitis   . EKG abnormalities    changes, stress test neg (false EKG changes)  . Gastritis   . GERD (gastroesophageal reflux disease)   . Hypertension   . Hypothyroid 01/16/2014  . Meningioma ([place]) 10/21/2013  . Psoriasis    sees derm      Past Surgical History:  Procedure Laterality Date  . arthroscopy  04/2010   Right knee  . TUBAL LIGATION      in for   Chief Complaint  Patient presents with  . Code Stroke     HPI  [name]  is a 72 y.o. female, with past medical history significant for hypertension, coronary artery disease and bipolar disorder presenting today with few hours a history of right arm weakness, slurred speech and weakness. The patient woke up in the morning with right hand weakness, headache and went to her hairdresser later on where she was picked up by her husband who noticed right facial droop and slurred speech. Patient complains of unable to use her right hand for writing. Patient denies visual symptoms, vomiting, diarrhea or leg weakness.    Review of Systems    In addition to the HPI above, No Fever-chills, No Headache, No changes with Vision or hearing, No problems swallowing food or Liquids, No Chest pain, Cough or Shortness of Breath, No Abdominal pain, No Nausea or Vommitting, Bowel movements are regular, No Blood in stool or Urine, No dysuria, No new  skin rashes or bruises, No new joints pains-aches,  No recent weight gain or loss, No polyuria, polydypsia or polyphagia, No significant Mental Stressors.  A full 10 point Review of Systems was done, except as stated above, all other Review of Systems were negative.   Social History Social History   Tobacco Use  . Smoking status: Current Every Day Smoker    Packs/day: 1.00    Years: 60.00    Pack years: 60.00    Types: Cigarettes  . Smokeless tobacco: Never Used  Substance Use Topics  . Alcohol use: Yes    Alcohol/week: 0.0 oz    Comment: yes on occassion     Family History Family History  Problem Relation Age of Onset  . Breast cancer Mother        metastisis to bones  . Diabetes Son   . Heart disease Unknown        grandfather   . Alcohol abuse Brother   .  Heart disease Maternal Aunt   . Lung cancer Brother        smoked  . Colon cancer Neg Hx      Prior to Admission medications   Medication Sig Start Date End Date Taking? Authorizing Provider  bisacodyl (DULCOLAX) 5 MG EC tablet Take 5 mg by mouth daily as needed for constipation.   Yes [provider]  clonazePAM (KLONOPIN) 2 MG tablet Take 0.5 mg by mouth at bedtime.  04/29/12  Yes Leandrew Koyanagi, MD  DULoxetine (CYMBALTA) 30 MG capsule Take 60 mg by mouth daily.    Yes [provider]  famotidine (PEPCID) 20 MG tablet One at bedtime 02/14/16  Yes Tanda Rockers, MD  LamoTRIgine 100 MG TBDP Take 100 mg by mouth daily.    Yes [provider]  magnesium gluconate (MAGONATE) 500 MG tablet Take 1,000 mg by mouth daily.    Yes [provider]  nitroGLYCERIN (NITROSTAT) 0.4 MG SL tablet Place 1 tablet (0.4 mg total) under the tongue every 5 (five) minutes as needed for chest pain. 10/22/13  Yes Rai, Ripudeep K, MD  polyethylene glycol (MIRALAX / GLYCOLAX) packet Take 17 g by mouth daily.   Yes [provider]  rOPINIRole (REQUIP) 2 MG tablet TAKE 1 TABLET BY MOUTH AT  BEDTIME Patient taking differently: TAKE 2 mg TABLET BY MOUTH AT BEDTIME 05/27/12  Yes Dunn, Ryan M, PA-C  senna (SENOKOT) 8.6 MG TABS tablet Take 1 tablet by mouth at bedtime.   Yes [provider]  amLODipine (NORVASC) 2.5 MG tablet Take 1 tablet (2.5 mg total) by mouth daily. Patient not taking: Reported on 10/23/2017 11/17/16 02/15/17  Lorretta Harp, MD    No Known Allergies  Physical Exam  Vitals  Blood pressure (!) 201/74, pulse 68, temperature 99.1 F (37.3 C), temperature source Oral, resp. rate (!) 23, height 5\' 9"  (1.753 m), weight 69.4 kg (153 lb), SpO2 99 %.   1. General a very pleasant female, well-developed , well-nourished  2. Normal affect and insight, Not Suicidal or Homicidal, Awake Alert, Oriented X 3.  3. Right upper extremity 4/5 otherwise 5 over 5 . Normal sensation finger to nose normal bilaterally. Babinski's negative bilaterally .  4. Ears and Eyes appear Normal, facial deviation noted  Conjunctivae clear, PERRLA. Moist Oral Mucosa.   5. Supple Neck, No JVD, No cervical lymphadenopathy appriciated, No Carotid Bruits.  6. Symmetrical Chest wall movement, Good air movement bilaterally, CTAB.  7. RRR, No Gallops, Rubs or Murmurs, No Parasternal Heave.  8. Positive Bowel Sounds, Abdomen Soft, Non tender, No organomegaly appriciated,No rebound -guarding or rigidity.  9.  No Cyanosis, Normal Skin Turgor, No Skin Rash or Bruise.   Data Review  CBC Recent Labs  Lab 10/23/17 1630 10/23/17 1633  WBC 7.6  --   HGB 13.0 13.6  HCT 39.7 40.0  PLT 202  --   MCV 93.4  --   MCH 30.6  --   MCHC 32.7  --   RDW 13.7  --   LYMPHSABS 2.7  --   MONOABS 0.6  --   EOSABS 0.2  --   BASOSABS 0.0  --    ------------------------------------------------------------------------------------------------------------------  Chemistries  Recent Labs  Lab 10/23/17 1630 10/23/17 1633  NA 136 139  K 3.8 3.8  CL 99* 96*  CO2 27  --   GLUCOSE 102* 93  BUN 15  18  CREATININE 0.82 0.80  CALCIUM 9.2  --   AST  21  --   ALT 19  --   ALKPHOS 65  --   BILITOT 0.4  --    ------------------------------------------------------------------------------------------------------------------ estimated creatinine clearance is 64.5 mL/min (by C-G formula based on SCr of 0.8 mg/dL). ------------------------------------------------------------------------------------------------------------------ No results for input(s): TSH, T4TOTAL, T3FREE, THYROIDAB in the last 72 hours.  Invalid input(s): FREET3   Coagulation profile Recent Labs  Lab 10/23/17 1630  INR 1.01   ------------------------------------------------------------------------------------------------------------------- No results for input(s): DDIMER in the last 72 hours. -------------------------------------------------------------------------------------------------------------------  Cardiac Enzymes No results for input(s): CKMB, TROPONINI, MYOGLOBIN in the last 168 hours.  Invalid input(s): CK ------------------------------------------------------------------------------------------------------------------ Invalid input(s): POCBNP   ---------------------------------------------------------------------------------------------------------------  Urinalysis    Component Value Date/Time   COLORURINE STRAW (A) 10/23/2017 1822   APPEARANCEUR CLEAR 10/23/2017 1822   LABSPEC 1.004 (L) 10/23/2017 1822   PHURINE 7.0 10/23/2017 1822   GLUCOSEU NEGATIVE 10/23/2017 1822   HGBUR SMALL (A) 10/23/2017 1822   BILIRUBINUR NEGATIVE 10/23/2017 1822   BILIRUBINUR neg 07/31/2013 1752   KETONESUR NEGATIVE 10/23/2017 1822   PROTEINUR NEGATIVE 10/23/2017 1822   UROBILINOGEN 0.2 07/31/2013 1752   NITRITE NEGATIVE 10/23/2017 1822   LEUKOCYTESUR NEGATIVE 10/23/2017 1822    ----------------------------------------------------------------------------------------------------------------   Imaging  results:   Ct Head Code Stroke Wo Contrast  Result Date: 10/23/2017 CLINICAL DATA:  Code stroke.  Headache and right-sided facial droop EXAM: CT HEAD WITHOUT CONTRAST TECHNIQUE: Contiguous axial images were obtained from the base of the skull through the vertex without intravenous contrast. COMPARISON:  Head CT 01/15/2017 FINDINGS: Brain: No mass lesion or acute hemorrhage. No focal hypoattenuation of the basal ganglia or cortex to indicate infarcted tissue. There is periventricular hypoattenuation compatible with chronic microvascular disease. Area of hypodensity in the left insula is unchanged. Left frontal convexity meningioma measures 11 mm. Vascular: No hyperdense vessel. No advanced atherosclerotic calcification of the arteries at the skull base. Skull: Normal visualized skull base, calvarium and extracranial soft tissues. Sinuses/Orbits: No sinus fluid levels or advanced mucosal thickening. No mastoid effusion. Normal orbits. ASPECTS [facility] Stroke Program Early CT Score) - Ganglionic level infarction (caudate, lentiform nuclei, internal capsule, insula, M1-M3 cortex): 7 - Supraganglionic infarction (M4-M6 cortex): 3 Total score (0-10 with 10 being normal): 10 IMPRESSION: 1. No hemorrhage or mass lesion. 2. ASPECTS is 10. 3. Chronic ischemic microangiopathy. 4. 11 mm left frontal convexity meningioma, unchanged. These results were communicated to Dr. Kerney Elbe at 4:45 pm on 10/23/2017 by text page via the [facility] messaging system. Electronically Signed   By: Ulyses Jarred M.D.   On: 10/23/2017 16:45      Assessment & Plan  1. Acute CVA with negative CT for acute changes 2. History of meningioma, unchanged 3. Hypertension, uncontrolled  Plan  Awaiting MRI/MRA of the head Place in telemetry Neurochecks Neurology following Labetalol IV when necessary   DVT Prophylaxis Heparin  AM Labs Ordered, also please review Full Orders    Code Status full   Disposition Plan:   Time spent  in minutes : 46 minutes   Condition GUARDED   @SIGNATURE @

## 2017-10-23 NOTE — ED Notes (Signed)
Pt ambulated to RR with stand by assist, unsteady gait.

## 2017-10-23 NOTE — Consult Note (Signed)
Neurology Consultation Reason for Consult: Code Stroke Referring Physician: ED physician  CC: Right hand incoordination, right facial droop, and dysarthria  History is obtained from: patient, RN, chart review  HPI: [name] is a 72 y.o. female woman with PMHx significant for HTN and tobacco use who presents to the ED as a code stroke.  Patient went to bed 4am and when she awoke at 10am she noted right hand incoordination as well as headache and some nausea.  She then went to the hairdresser.  WHen her husband picked her up, he noticed a slight right facial droop and dysarthria.  They returned home and contacted EMS.  Code Stroke was then called and she presented to [facility] ED where a non-contrasted head CT was negative for acute hemorrhage.  LKW: 4am tpa given?: no, outside window  NIHSS: 3  ROS: Per HPI  Past Medical History:  Diagnosis Date  . Bipolar disorder ([place])   . CAD (coronary artery disease)    intra and extracranial vascular dx per MRI 4/11, neurology rec strict CVRF control  . Colonic inertia   . Constipation    chronic;severe  . Depression   . Duodenitis   . EKG abnormalities    changes, stress test neg (false EKG changes)  . Gastritis   . GERD (gastroesophageal reflux disease)   . Hypertension   . Hypothyroid 01/16/2014  . Meningioma ([place]) 10/21/2013  . Psoriasis    sees derm     Family History  Problem Relation Age of Onset  . Breast cancer Mother        metastisis to bones  . Diabetes Son   . Heart disease Unknown        grandfather   . Alcohol abuse Brother   . Heart disease Maternal Aunt   . Lung cancer Brother        smoked  . Colon cancer Neg Hx     Social History:  reports that she has been smoking cigarettes.  She has a 60.00 pack-year smoking history. she has never used smokeless tobacco. She reports that she drinks alcohol. She reports that she does not use drugs.  Current Meds  Medication Sig  . bisacodyl (DULCOLAX) 5 MG EC tablet  Take 5 mg by mouth daily as needed for constipation.  . clonazePAM (KLONOPIN) 2 MG tablet Take 0.5 mg by mouth at bedtime.   . DULoxetine (CYMBALTA) 30 MG capsule Take 60 mg by mouth daily.   . famotidine (PEPCID) 20 MG tablet One at bedtime  . LamoTRIgine 100 MG TBDP Take 100 mg by mouth daily.   . magnesium gluconate (MAGONATE) 500 MG tablet Take 1,000 mg by mouth daily.   . nitroGLYCERIN (NITROSTAT) 0.4 MG SL tablet Place 1 tablet (0.4 mg total) under the tongue every 5 (five) minutes as needed for chest pain.  . polyethylene glycol (MIRALAX / GLYCOLAX) packet Take 17 g by mouth daily.  Marland Kitchen rOPINIRole (REQUIP) 2 MG tablet TAKE 1 TABLET BY MOUTH AT BEDTIME (Patient taking differently: TAKE 2 mg TABLET BY MOUTH AT BEDTIME)  . senna (SENOKOT) 8.6 MG TABS tablet Take 1 tablet by mouth at bedtime.    Exam: Current vital signs: BP (!) 186/88 (BP Location: Right Arm)   Pulse 62   Temp 99.1 F (37.3 C) (Oral)   Resp 15   Ht 5\' 9"  (1.753 m)   Wt 153 lb (69.4 kg)   SpO2 99%   BMI 22.59 kg/m  Vital signs in  last 24 hours: Temp:  [99.1 F (37.3 C)] 99.1 F (37.3 C) (02/12 1705) Pulse Rate:  [62] 62 (02/12 1705) Resp:  [15] 15 (02/12 1705) BP: (186)/(88) 186/88 (02/12 1705) SpO2:  [99 %] 99 % (02/12 1705) Weight:  [153 lb (69.4 kg)] 153 lb (69.4 kg) (02/12 1713)   Physical Exam  Constitutional: She is a pleasant, elderly woman, in no acute distress.  Psych: Affect appropriate to situation Eyes: No scleral injection HENT: No OP obstrucion Head: Normocephalic, atraumatic.  Respiratory: Effort normal on room air Skin: Appears intact  Neuro: Mental Status: Patient is awake and alert. Fully oriented. She is able to follow 3-step commands. Speech is fluent. Naming intact. She has mildly dysarthric speech. Cranial Nerves: II: Visual Fields are full without deficit. Pupils are equal, round, and reactive to light.  III,IV, VI: Extra ocular muscles are intact, no extinction DSS V: Facial  sensation is symmetric to temperature VII: There is a mild right facial droop.  VIII: hearing is intact to voice X: Palette elevates symmetrically. XII: tongue is midline   Motor: Strength is equal 5/5 in the upper and lower extremities.  There is no pronator drift Sensory: Sensation is symmetric to light touch and temperature in the legs without extinction.  Sensation is symmetric to light touch and temperature in the arms Deep Tendon Reflexes: 1+ and symmetric in the biceps, achilles, and patellae.  Plantars: Toes are downgoing bilaterally.  Cerebellar: Finger to nose intact bilaterally Heel to shin intact on right, slightly ataxic on the left.   I have reviewed labs in epic and the results pertinent to this consultation are: CBG 93 Creatinine 0.80 K 3.8 Troponin 0.01 CBC normal  I have reviewed the images obtained:  CT Head: 1. No hemorrhage or mass lesion. 2. ASPECTS is 10. 3. Chronic ischemic microangiopathy. 4. 11 mm left frontal convexity meningioma, unchanged  Impression:  72 yo woman with stroke risk factors of HTN and tobacco abuse who presents with right hand incoordination, right facial droop, and dysarthria consistent with a left-sided ischemic event.  Last known well at 4am.   1. Emergent CT head without contrast was negative for hemorrhage. Exam findings most likely referable to a left internal capsule, thalamic, basal ganglion, deep white matter or pontine lacunar infarction 2. She did not receive tPA due to being outside the window. 3. Stroke risk factors: CAD, HTN, smoking  Recommendations: 1. HgbA1c, fasting lipid panel 2. MRI, MRA of the brain without contrast 3. Frequent neuro checks 4. Echocardiogram 5. Carotid dopplers 6. Prophylactic therapy-Antiplatelet med: Aspirin - dose 325mg  PO or 300mg  PR.  7. If work up confirms stroke, start atorvastatin 40 mg po qd and obtain baseline CK level 8. Telemetry monitoring 9. PT consult, OT consult, Speech  consult 10. Risk factor modification - smoking cessation 11. Permissive HTN x 24 hours 12. please page stroke NP  Or  PA  Or MD  from 8am -4 pm as this patient will be followed by the stroke team at this point.   You can look them up on [email or web address]     Jule Ser, PGY3 10/23/2017, 5:14 PM   I have seen and examined the patient. Impression and Recommendations discussed with Jule Ser, PGY3 Neurology Resident Electronically signed: Dr. Kerney Elbe

## 2017-10-23 NOTE — ED Provider Notes (Signed)
[place] [facility] EMERGENCY DEPARTMENT Provider Note   CSN: [id number] Arrival date & time: 10/23/17  6734   An emergency department physician performed an initial assessment on this suspected stroke patient at 1626.  History   Chief Complaint Chief Complaint  Patient presents with  . Code Stroke    HPI [name] is a 72 y.o. female.  HPI 72 year old Caucasian female past medical history significant for CAD, hypertension, hypothyroidism, bipolar disorder that presents to the emergency department today for strokelike symptoms.  Patient states that she went to bed at 4 AM.  Patient states that she woke up this morning approximately 10 AM and noted some right hand coordination problems as well as a headache and had some nausea.  She states that she did not think anything of it and went to her hairdresser.  When her husband picked her up from the hairdresser he noted that she had a right facial droop and some slurred speech.  Patient states that when she was writing the check at the hairdresser she noticed that her right hand was weak.  Patient was brought to the ED for evaluation and initial code stroke was called.   Pt denies any fever, chill, ha, vision changes, lightheadedness, dizziness, congestion, neck pain, cp, sob, cough, abd pain, n/v/d, urinary symptoms, change in bowel habits, melena, hematochezia, lower extremity paresthesias.  Past Medical History:  Diagnosis Date  . Bipolar disorder ([place])   . CAD (coronary artery disease)    intra and extracranial vascular dx per MRI 4/11, neurology rec strict CVRF control  . Colonic inertia   . Constipation    chronic;severe  . Depression   . Duodenitis   . EKG abnormalities    changes, stress test neg (false EKG changes)  . Gastritis   . GERD (gastroesophageal reflux disease)   . Hypertension   . Hypothyroid 01/16/2014  . Meningioma ([place]) 10/21/2013  . Psoriasis    sees derm    Patient Active Problem List   Diagnosis Date Noted  . Critical lower limb ischemia 11/08/2016  . Tobacco abuse 11/08/2016  . 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 Cigarette smoker 12/24/2015  . Hypothyroid ? 01/16/2014  . Mild cognitive impairment 01/16/2014  . Meningioma ([place]) 10/21/2013  . Chest pain 10/20/2013  . Medicare annual wellness visit, subsequent 06/16/2013  . Dizziness and giddiness 06/16/2013  . RLS (restless legs syndrome) 04/29/2012  . Abdominal pain 12/27/2010  . DEGENERATIVE JOINT DISEASE 09/23/2010  . CAROTID ARTERY DISEASE 08/15/2010  . CHEST PAIN 08/15/2010  . HEADACHE 12/02/2009  . BACK PAIN 11/22/2009  . CONSTIPATION, CHRONIC 01/29/2008  . NAUSEA 01/28/2008  . DEPRESSION 03/08/2007  . Essential hypertension 03/08/2007    Past Surgical History:  Procedure Laterality Date  . arthroscopy  04/2010   Right knee  . TUBAL LIGATION      OB History    No data available       Home Medications    Prior to Admission medications   Medication Sig Start Date End Date Taking? Authorizing Provider  bisacodyl (DULCOLAX) 5 MG EC tablet Take 5 mg by mouth daily as needed for constipation.   Yes [provider]  clonazePAM (KLONOPIN) 2 MG tablet Take 0.5 mg by mouth at bedtime.  04/29/12  Yes Leandrew Koyanagi, MD  DULoxetine (CYMBALTA) 30 MG capsule Take 60 mg by mouth daily.    Yes [provider]  famotidine (PEPCID) 20 MG tablet One at bedtime 02/14/16  Yes  Tanda Rockers, MD  LamoTRIgine 100 MG TBDP Take 100 mg by mouth daily.    Yes [provider]  magnesium gluconate (MAGONATE) 500 MG tablet Take 1,000 mg by mouth daily.    Yes [provider]  nitroGLYCERIN (NITROSTAT) 0.4 MG SL tablet Place 1 tablet (0.4 mg total) under the tongue every 5 (five) minutes as needed for chest pain. 10/22/13  Yes Rai, Ripudeep K, MD  polyethylene glycol (MIRALAX / GLYCOLAX) packet Take 17 g by mouth daily.   Yes [provider]  rOPINIRole  (REQUIP) 2 MG tablet TAKE 1 TABLET BY MOUTH AT BEDTIME Patient taking differently: TAKE 2 mg TABLET BY MOUTH AT BEDTIME 05/27/12  Yes Dunn, Ryan M, PA-C  senna (SENOKOT) 8.6 MG TABS tablet Take 1 tablet by mouth at bedtime.   Yes [provider]  amLODipine (NORVASC) 2.5 MG tablet Take 1 tablet (2.5 mg total) by mouth daily. Patient not taking: Reported on 10/23/2017 11/17/16 02/15/17  Lorretta Harp, MD    Family History Family History  Problem Relation Age of Onset  . Breast cancer Mother        metastisis to bones  . Diabetes Son   . Heart disease Unknown        grandfather   . Alcohol abuse Brother   . Heart disease Maternal Aunt   . Lung cancer Brother        smoked  . Colon cancer Neg Hx     Social History Social History   Tobacco Use  . Smoking status: Current Every Day Smoker    Packs/day: 1.00    Years: 60.00    Pack years: 60.00    Types: Cigarettes  . Smokeless tobacco: Never Used  Substance Use Topics  . Alcohol use: Yes    Alcohol/week: 0.0 oz    Comment: yes on occassion  . Drug use: No     Allergies   Patient has no known allergies.   Review of Systems Review of Systems  Constitutional: Negative for chills and fever.  HENT: Negative for congestion.   Eyes: Negative for visual disturbance.  Respiratory: Negative for cough and shortness of breath.   Cardiovascular: Negative for chest pain.  Gastrointestinal: Negative for abdominal pain, diarrhea, nausea and vomiting.  Genitourinary: Negative for dysuria, flank pain, frequency, hematuria, urgency, vaginal bleeding and vaginal discharge.  Musculoskeletal: Negative for arthralgias and myalgias.  Skin: Negative for rash.  Neurological: Positive for facial asymmetry, speech difficulty, weakness and numbness. Negative for dizziness, syncope, light-headedness and headaches.  Psychiatric/Behavioral: Negative for sleep disturbance. The patient is not nervous/anxious.      Physical Exam Updated  Vital Signs BP (!) 186/88 (BP Location: Right Arm)   Pulse 62   Temp 99.1 F (37.3 C) (Oral)   Resp 15   Ht 5\' 9"  (1.753 m)   Wt 69.4 kg (153 lb)   SpO2 99%   BMI 22.59 kg/m   Physical Exam  Constitutional: She is oriented to person, place, and time. She appears well-developed and well-nourished.  Non-toxic appearance. No distress.  HENT:  Head: Normocephalic and atraumatic.  Nose: Nose normal.  Mouth/Throat: Oropharynx is clear and moist.  Eyes: Conjunctivae are normal. Pupils are equal, round, and reactive to light. Right eye exhibits no discharge. Left eye exhibits no discharge.  Neck: Normal range of motion. Neck supple.  Cardiovascular: Normal rate, regular rhythm, normal heart sounds and intact distal pulses. Exam reveals no gallop and no friction rub.  No  murmur heard. Pulmonary/Chest: Effort normal and breath sounds normal. No stridor. No respiratory distress. She has no wheezes. She has no rales. She exhibits no tenderness.  Abdominal: Soft. Bowel sounds are normal. There is no tenderness. There is no rebound and no guarding.  Musculoskeletal: Normal range of motion. She exhibits no tenderness.  Lymphadenopathy:    She has no cervical adenopathy.  Neurological: She is alert and oriented to person, place, and time.  Mental Status: Patient is awake and alert.  She is able to follow 3-step commands She has mild dysarthric speech Cranial Nerves: II: Visual Fields are full without deficit. Pupils are equal, round, and reactive to light.  III,IV, VI: Extra ocular muscles are intact, no extinction DSS V: Facial sensation is symmetric to temperature VII: THere is a mild right facial droop.  VIII: hearing is intact to voice X: Palette elevates symmetrically. XII: tongue is midline   Motor: Strength is equal 5/5 in the upper and lower extremities.  There is no pronator drift Sensory: Sensation intact in all extremities Deep Tendon Reflexes: 1+ and symmetric in the biceps,  achilles, and patellae.  Plantars: Toes are downgoing bilaterally.  Cerebellar: Finger to nose intact bilaterally Heel to shin intact on right, slightly ataxic on the left    Skin: Skin is warm and dry. Capillary refill takes less than 2 seconds.  Psychiatric: Her behavior is normal. Judgment and thought content normal.  Nursing note and vitals reviewed.    ED Treatments / Results  Labs (all labs ordered are listed, but only abnormal results are displayed) Labs Reviewed  COMPREHENSIVE METABOLIC PANEL - Abnormal; Notable for the following components:      Result Value   Chloride 99 (*)    Glucose, Bld 102 (*)    All other components within normal limits  I-STAT CHEM 8, ED - Abnormal; Notable for the following components:   Chloride 96 (*)    Calcium, Ion 1.13 (*)    TCO2 33 (*)    All other components within normal limits  ETHANOL  PROTIME-INR  APTT  CBC  DIFFERENTIAL  RAPID URINE DRUG SCREEN, HOSP PERFORMED  URINALYSIS, ROUTINE W REFLEX MICROSCOPIC  I-STAT TROPONIN, ED  CBG MONITORING, ED    EKG  EKG Interpretation  Date/Time:  Tuesday October 23 2017 17:03:47 EST Ventricular Rate:  64 PR Interval:    QRS Duration: 92 QT Interval:  396 QTC Calculation: 409 R Axis:   25 Text Interpretation:  Sinus rhythm LAE, consider biatrial enlargement Anteroseptal infarct, old Nonspecific T abnormalities, lateral leads No significant change since last tracing Confirmed by Blanchie Dessert [phone]) on 10/23/2017 6:18:21 PM       Radiology Ct Head Code Stroke Wo Contrast  Result Date: 10/23/2017 CLINICAL DATA:  Code stroke.  Headache and right-sided facial droop EXAM: CT HEAD WITHOUT CONTRAST TECHNIQUE: Contiguous axial images were obtained from the base of the skull through the vertex without intravenous contrast. COMPARISON:  Head CT 01/15/2017 FINDINGS: Brain: No mass lesion or acute hemorrhage. No focal hypoattenuation of the basal ganglia or cortex to indicate infarcted  tissue. There is periventricular hypoattenuation compatible with chronic microvascular disease. Area of hypodensity in the left insula is unchanged. Left frontal convexity meningioma measures 11 mm. Vascular: No hyperdense vessel. No advanced atherosclerotic calcification of the arteries at the skull base. Skull: Normal visualized skull base, calvarium and extracranial soft tissues. Sinuses/Orbits: No sinus fluid levels or advanced mucosal thickening. No mastoid effusion. Normal orbits. ASPECTS [facility] Stroke Program Early  CT Score) - Ganglionic level infarction (caudate, lentiform nuclei, internal capsule, insula, M1-M3 cortex): 7 - Supraganglionic infarction (M4-M6 cortex): 3 Total score (0-10 with 10 being normal): 10 IMPRESSION: 1. No hemorrhage or mass lesion. 2. ASPECTS is 10. 3. Chronic ischemic microangiopathy. 4. 11 mm left frontal convexity meningioma, unchanged. These results were communicated to Dr. Kerney Elbe at 4:45 pm on 10/23/2017 by text page via the [facility] messaging system. Electronically Signed   By: Ulyses Jarred M.D.   On: 10/23/2017 16:45    Procedures Procedures (including critical care time)  Medications Ordered in ED Medications - No data to display   Initial Impression / Assessment and Plan / ED Course  I have reviewed the triage vital signs and the nursing notes.  Pertinent labs & imaging results that were available during my care of the patient were reviewed by me and considered in my medical decision making (see chart for details).     Patient presents to the ED for evaluation of strokelike symptoms.  Specifically patient complains of slurred speech, right facial droop, right hand weakness and ataxia.  Patient's last known normal was at 4 AM this morning when she went to sleep.  On exam patient does have some dysarthria noted.  Mild ataxia noted.  She does have this subtle right facial droop.  She follows commands appropriately.  Lungs clear to auscultation bilaterally.   Heart regular rate and rhythm.  No focal abdominal tenderness.  Patient was initially a code stroke at the bridge.  She was taken to CT scan with neurology.  CT scan showed no acute findings including no hemorrhage or mass lesion.  Patient was outside the window for TPA.  Neurology has recommended admission for stroke workup.  Patient remains hemodynamically stable this time.  Updated on plan of care.  Spoke with hospital medicine for admission who agrees and will place admission orders.  Pt seen and eval by my attending who is agreeable with the above plan.   Final Clinical Impressions(s) / ED Diagnoses   Final diagnoses:  Aphasia  Facial droop  Right sided weakness    ED Discharge Orders    None       Aaron Edelman 10/23/17 1853    Blanchie Dessert, MD 10/26/17 [phone]

## 2017-10-23 NOTE — ED Triage Notes (Signed)
Pt arrived via [facility] from home where pt family reports that when husband picked pt up from hair apt today at 48 he noticed slurred speech and left sided facial droop. Pt reports she woke up with nausea and headache today. Pt also states that she has not felt well all day and had an abnormal sensation in her right hand.

## 2017-10-23 NOTE — ED Notes (Signed)
Attempted IV X2 unable to obtain stroke nurse and CT nurse also attempted. IV Team order placed.

## 2017-10-23 NOTE — ED Notes (Signed)
Attempted report x1. No answer for 97mins, will reattempt.

## 2017-10-23 NOTE — Code Documentation (Signed)
72 year old female presents to [facility] via [place] as code stroke.  Patient reports she went to sleep this AM at 0400 and all was well.  She woke up at 1000 with a headache and nausea - also noticed she had difficulty with using right hand. She went to the hairdresser - husband picked her up and noticed right facial droop and slurred speech.  She went home - EMS was called and stroke was called in the field.  On arrival to ED she is alert - oriented - no focal weakness noted - right facial droop - slurred speech - she reports she feels like the left side of her face is different. CBG 93.  She is hypertensive - states she is terrible with consistency of taking meds.   NIHSS 3 with lower limb ataxia.  CT scan unrevealing.  Dr. Cheral Marker at bedside - no acute treatment - no LVO sx - outside tPA window.  Back to ED.  Handoff to [employer].  Husband at bedside - updated.

## 2017-10-24 ENCOUNTER — Inpatient Hospital Stay (HOSPITAL_COMMUNITY): Payer: Medicare Other

## 2017-10-24 ENCOUNTER — Encounter (HOSPITAL_COMMUNITY): Payer: Self-pay | Admitting: Radiology

## 2017-10-24 DIAGNOSIS — R079 Chest pain, unspecified: Secondary | ICD-10-CM

## 2017-10-24 DIAGNOSIS — I63312 Cerebral infarction due to thrombosis of left middle cerebral artery: Secondary | ICD-10-CM

## 2017-10-24 DIAGNOSIS — E785 Hyperlipidemia, unspecified: Secondary | ICD-10-CM

## 2017-10-24 DIAGNOSIS — Z720 Tobacco use: Secondary | ICD-10-CM

## 2017-10-24 DIAGNOSIS — R29810 Facial weakness: Secondary | ICD-10-CM

## 2017-10-24 LAB — BASIC METABOLIC PANEL
ANION GAP: 9 (ref 5–15)
BUN: 12 mg/dL (ref 6–20)
CHLORIDE: 101 mmol/L (ref 101–111)
CO2: 30 mmol/L (ref 22–32)
CREATININE: 0.79 mg/dL (ref 0.44–1.00)
Calcium: 9.3 mg/dL (ref 8.9–10.3)
GFR calc non Af Amer: >60 mL/min (ref >60)
GLUCOSE: 122 mg/dL — AB (ref 65–99)
Potassium: 3.9 mmol/L (ref 3.5–5.1)
Sodium: 140 mmol/L (ref 135–145)

## 2017-10-24 LAB — CBC
HEMATOCRIT: 40.3 % (ref 36.0–46.0)
HEMOGLOBIN: 12.9 g/dL (ref 12.0–15.0)
MCH: 30 pg (ref 26.0–34.0)
MCHC: 32 g/dL (ref 30.0–36.0)
MCV: 93.7 fL (ref 78.0–100.0)
Platelets: 206 10*3/uL (ref 150–400)
RBC: 4.3 MIL/uL (ref 3.87–5.11)
RDW: 13.8 % (ref 11.5–15.5)
WBC: 8.3 10*3/uL (ref 4.0–10.5)

## 2017-10-24 LAB — LIPID PANEL
CHOLESTEROL: 175 mg/dL (ref 0–200)
HDL: 55 mg/dL (ref >40)
LDL CALC: 104 mg/dL — AB (ref 0–99)
Total CHOL/HDL Ratio: 3.2 RATIO
Triglycerides: 80 mg/dL (ref <150)
VLDL: 16 mg/dL (ref 0–40)

## 2017-10-24 LAB — HEMOGLOBIN A1C
Hgb A1c MFr Bld: 5.8 % — ABNORMAL HIGH (ref 4.8–5.6)
Mean Plasma Glucose: 119.76 mg/dL

## 2017-10-24 MED ORDER — LOSARTAN POTASSIUM 50 MG PO TABS
50.0000 mg | ORAL_TABLET | Freq: Every day | ORAL | Status: DC
  Administered 2017-10-25: 50 mg via ORAL
  Filled 2017-10-24: qty 1

## 2017-10-24 MED ORDER — ASPIRIN EC 325 MG PO TBEC
325.0000 mg | DELAYED_RELEASE_TABLET | Freq: Every day | ORAL | Status: DC
  Administered 2017-10-24 – 2017-10-25 (×2): 325 mg via ORAL
  Filled 2017-10-24 (×2): qty 1

## 2017-10-24 MED ORDER — LORAZEPAM 2 MG/ML IJ SOLN
1.0000 mg | Freq: Once | INTRAMUSCULAR | Status: AC
  Administered 2017-10-24: 1 mg via INTRAVENOUS
  Filled 2017-10-24: qty 1

## 2017-10-24 MED ORDER — ATORVASTATIN CALCIUM 40 MG PO TABS
40.0000 mg | ORAL_TABLET | Freq: Every day | ORAL | Status: DC
  Administered 2017-10-24: 40 mg via ORAL
  Filled 2017-10-24: qty 1

## 2017-10-24 MED ORDER — IOPAMIDOL (ISOVUE-370) INJECTION 76%
INTRAVENOUS | Status: AC
  Administered 2017-10-24: 50 mL
  Filled 2017-10-24: qty 50

## 2017-10-24 MED ORDER — AMLODIPINE BESYLATE 10 MG PO TABS
10.0000 mg | ORAL_TABLET | Freq: Every day | ORAL | Status: DC
  Administered 2017-10-25: 10 mg via ORAL
  Filled 2017-10-24: qty 1

## 2017-10-24 NOTE — Progress Notes (Signed)
NEUROHOSPITALISTS STROKE TEAM - DAILY PROGRESS NOTE   ADMISSION HISTORY: [name] is a 72 y.o. female woman with PMHx significant for HTN and tobacco use who presents to the ED as a code stroke.  Patient went to bed 4am and when she awoke at 10am she noted right hand incoordination as well as headache and some nausea.  She then went to the hairdresser.  WHen her husband picked her up, he noticed a slight right facial droop and dysarthria.  They returned home and contacted EMS.  Code Stroke was then called and she presented to the [facility] ED where a non-contrasted head CT was negative for acute hemorrhage.  LKW: 4am tpa given?: no, outside window NIHSS: 3  SUBJECTIVE (INTERVAL HISTORY) Family is at the bedside. Patient is found laying in bed in NAD. Overall she feels her condition is unchanged. Voices no new complaints. No new events reported overnight. Patient states she was unable to complete MRI due to "horrible noises".   OBJECTIVE Lab Results: CBC:  Recent Labs  Lab 10/23/17 1630 10/23/17 1633 10/23/17 2029 10/24/17 0846  WBC 7.6  --  7.2 8.3  HGB 13.0 13.6 13.0 12.9  HCT 39.7 40.0 39.8 40.3  MCV 93.4  --  92.8 93.7  PLT 202  --  204 206   BMP: Recent Labs  Lab 10/23/17 1630 10/23/17 1633 10/23/17 2029 10/24/17 0846  NA 136 139  --  140  K 3.8 3.8  --  3.9  CL 99* 96*  --  101  CO2 27  --   --  30  GLUCOSE 102* 93  --  122*  BUN 15 18  --  12  CREATININE 0.82 0.80 0.90 0.79  CALCIUM 9.2  --   --  9.3   Liver Function Tests:  Recent Labs  Lab 10/23/17 1630  AST 21  ALT 19  ALKPHOS 65  BILITOT 0.4  PROT 6.5  ALBUMIN 3.5   Coagulation Studies:  Recent Labs    10/23/17 1630  APTT 34  INR 1.01   Urinalysis:  Recent Labs  Lab 10/23/17 1822  COLORURINE STRAW*  APPEARANCEUR CLEAR  LABSPEC 1.004*  PHURINE 7.0  GLUCOSEU NEGATIVE  HGBUR SMALL*  BILIRUBINUR NEGATIVE  KETONESUR NEGATIVE  PROTEINUR  NEGATIVE  NITRITE NEGATIVE  LEUKOCYTESUR NEGATIVE   Urine Drug Screen:     Component Value Date/Time   LABOPIA NONE DETECTED 10/23/2017 1822   COCAINSCRNUR NONE DETECTED 10/23/2017 1822   LABBENZ NONE DETECTED 10/23/2017 1822   AMPHETMU NONE DETECTED 10/23/2017 1822   THCU NONE DETECTED 10/23/2017 1822   LABBARB NONE DETECTED 10/23/2017 1822    Alcohol Level:  Recent Labs  Lab 10/23/17 1630  ETH <10   PHYSICAL EXAM Temp:  [97.9 F (36.6 C)-99.1 F (37.3 C)] 98.4 F (36.9 C) (02/13 1458) Pulse Rate:  [55-70] 65 (02/13 1458) Resp:  [14-23] 18 (02/13 1458) BP: (158-201)/(66-96) 184/86 (02/13 1458) SpO2:  [94 %-100 %] 97 % (02/13 1458) Weight:  [69.4 kg (153 lb)] 69.4 kg (153 lb) (02/12 1713) General - Well nourished, well developed, in no apparent distress HEENT-  Normocephalic,  Cardiovascular - Regular rate and rhythm  Respiratory - Lungs clear bilaterally. No wheezing. Abdomen - soft and non-tender, BS normal Extremities- no edema or cyanosis Mental Status: Patient is awake and alert. Fully oriented. She is able to follow 3-step commands. Speech is fluent. Naming intact. She has mildly dysarthric speech. Cranial Nerves: II: Visual Fields are full without deficit.  Pupils are equal, round, and reactive to light.  III,IV, VI: Extra ocular muscles are intact, no extinction DSS V: Facial sensation is symmetric to temperature VII: There is a mild right facial droop.  VIII: hearing is intact to voice X: Palette elevates symmetrically. XII: tongue is midline   Motor: Strength is equal 5/5 in the upper and lower extremities.  There is no pronator drift Sensory: Sensation is symmetric to light touch and temperature in the legs without extinction.  Sensation is symmetric to light touch and temperature in the arms Deep Tendon Reflexes: 1+ and symmetric in the biceps, achilles, and patellae.  Plantars: Toes are downgoing bilaterally.  Cerebellar: Finger to nose intact  bilaterally Heel to shin intact on right, slightly ataxic on the left.  IMAGING: I have personally reviewed the radiological images below and agree with the radiology interpretations. Ct Head Code Stroke Wo Contrast Result Date: 10/23/2017 IMPRESSION: 1. No hemorrhage or mass lesion. 2. ASPECTS is 10. 3. Chronic ischemic microangiopathy. 4. 11 mm left frontal convexity meningioma, unchanged. These results were communicated to Dr. Kerney Elbe at 4:45 pm on 10/23/2017 by text page via the [facility] messaging system. Electronically Signed   By: Ulyses Jarred M.D.   On: 10/23/2017 16:45   Echocardiogram:                                                    PENDING CTA Head/Neck:                                                     PENDING MRI Brain Limited:                                                   PENDING     IMPRESSION: [name] is a 72 y.o. female with PMH of HTN and tobacco abuse who presents with right hand incoordination, right facial droop, and dysarthria consistent with a left-sided ischemic event.  Emergent CT head without contrast was negative for acute stroke. Patient unable to tolerate MRI imaging with IV sedation due to noise.  Likely referable to a left internal capsule  Suspected Etiology: likely small vessel disease Resultant Symptoms: Right hand incoordination, right facial droop, and dysarthria Stroke Risk Factors: hyperlipidemia, hypertension and smoking Other Stroke Risk Factors: Advanced age, Cigarette smoker, Hx stroke, CAD, Hypothyroidism  Outstanding Stroke Work-up Studies:     Echocardiogram:                                                    PENDING CTA Head/Neck:                                                     PENDING  MRI Brain Limited:                                                   PENDING  10/24/2017 ASSESSMENT:   Neuro symptoms unchanged. Patient unable to tolerate MRI despite IV sedation. Patient states she has been told to be on ASA for  stroke prevention but states "I am a bad patient". Patient is aware that smoking increased her stroke risk. Testing pending. Continue ASA and statin for now.  PLAN  10/24/2017: Continue Aspirin/ Statin Frequent neuro checks Telemetry monitoring PT/OT/SLP Consult PM & Rehab Consult Case Management /MSW Ongoing aggressive stroke risk factor management Patient counseled to be compliant with her antithrombotic medications Patient counseled on Lifestyle modifications including, Diet, Exercise, and Stress Follow up with [facility] in 6 weeks  HX OF STROKES: Per patient Hx of 2 CVA's in the past  R/O AFIB: May need TEE and Loop Recorder Placement if imaging appears embolic in nature  11 mm left frontal convexity meningioma, Outpatient Neurology follow up and surveillance   HYPERTENSION: Stable Permissive hypertension (OK if <220/120) for 24-48 hours post stroke and then gradually normalized within 5-7 days. Long term BP goal normotensive. May slowly restart home B/P medications after 48 hours Home Meds: NONE  HYPERLIPIDEMIA:    Component Value Date/Time   CHOL 175 10/24/2017 0846   TRIG 80 10/24/2017 0846   HDL 55 10/24/2017 0846   CHOLHDL 3.2 10/24/2017 0846   VLDL 16 10/24/2017 0846   LDLCALC 104 (H) 10/24/2017 0846  Home Meds:  NONE LDL  goal < 70 Started on Lipitor to 40 mg daily Continue statin at discharge  PRE- DIABETES: Lab Results  Component Value Date   HGBA1C 5.8 (H) 10/24/2017  HgbA1c goal < 7.0 Continue CBG monitoring and SSI to maintain glucose 140-180 mg/dl DM education   TOBACCO ABUSE Current smoker Smoking cessation counseling provided Nicotine patch provided  Other Active Problems: Principal Problem:   CVA (cerebral vascular accident) [facility]) Active Problems:   Essential hypertension   Tobacco abuse    Hospital day # 1 VTE prophylaxis: Heparin  Diet : Fall precautions Aspiration precautions Diet heart healthy/carb modified  Room service appropriate? Yes; Fluid consistency: Thin   FAMILY UPDATES:  family at bedside  TEAM UPDATES: Rai, Vernelle Emerald, MD   Prior Home Stroke Medications:  No antithrombotic  Discharge Stroke Meds:  Please discharge patient on aspirin 325 mg daily   Disposition: 01-Home or Self Care Therapy Recs:               PENDING Follow Up:  Follow-up Information    Dennie Bible, NP. Schedule an appointment as soon as possible for a visit in 6 week(s).   Specialty:  Family Medicine Contact information: [address] [place] [address] [phone]          No primary care provider on file. -PCP Follow up in 1-2 weeks   Case Management aware of need   Assessment & plan discussed with with attending physician and they are in agreement.    Renie Ora Stroke Neurology Team 10/24/2017 3:14 PM  To contact Stroke Continuity provider, please refer to [email or web address] After hours, contact General Neurology  Attending note: I reviewed above note and agree with the assessment and plan. I have made any additions  or clarifications directly to the above note. Pt was seen and examined.   72 year old female with history of bipolar disorder, CAD, hypertension, small left frontal meningioma, smoker admitted for headache, right hand clumsiness, right facial droop and slurred speech.  CT no acute abnormality.  CTA head and neck showed atherosclerosis of carotid bifurcation and siphons bilaterally.  MRI and TTE pending.  UDS negative, LDL 104 and A1c 5.8.  She is on aspirin 325 and Lipitor now for stroke prevention.  Smoking cessation education provided.  BP still on the high end, increased amlodipine dose and add losartan.  PT/OT recommend outpatient PT/OT.  Rosalin Hawking, MD PhD Stroke Neurology 10/24/2017 6:03 PM

## 2017-10-24 NOTE — Evaluation (Signed)
Physical Therapy Evaluation Patient Details Name: [name] MRN: [id number] DOB: [birth year] Today's Date: 10/24/2017   History of Present Illness  CarolBobergis a41 y.o.female,with past medical history significant for hypertension, coronary artery disease and bipolar disorder presenting today with few hours a history of right arm weakness, slurred speech and weakness. Pt with h/o frontal meningioma   Clinical Impression  Pt functioning at supervision/ min guard due to impaired safety awareness, increased falls risk, and impaired memory. Pt with good home set up and support. Pt to benefit from OutPT to address balance deficits to minimize falls risk.    Follow Up Recommendations Outpatient PT;Supervision/Assistance - 24 hour    Equipment Recommendations  None recommended by PT    Recommendations for Other Services       Precautions / Restrictions Precautions Precautions: Fall Precaution Comments: unsteady Restrictions Weight Bearing Restrictions: No      Mobility  Bed Mobility Overal bed mobility: Needs Assistance Bed Mobility: Supine to Sit     Supine to sit: Supervision     General bed mobility comments: HOB elevated, mildly impulsive, supervision for safety  Transfers Overall transfer level: Needs assistance Equipment used: None Transfers: Sit to/from Stand Sit to Stand: Supervision         General transfer comment: supervision for safety due to impulsivity  Ambulation/Gait Ambulation/Gait assistance: Min guard Ambulation Distance (Feet): 300 Feet Assistive device: None Gait Pattern/deviations: Staggering left;Staggering right Gait velocity: wfl Gait velocity interpretation: at or above normal speed for age/gender General Gait Details: pt states "i do have a problem with my balance" no over episode of LOB however pt with noted lateral stagger both L and R  Stairs Stairs: Yes Stairs assistance: Min guard Stair Management: One rail  Right;Alternating pattern Number of Stairs: 10 General stair comments: v/c's to slow down  Wheelchair Mobility    Modified Rankin (Stroke Patients Only) Modified Rankin (Stroke Patients Only) Pre-Morbid Rankin Score: No significant disability Modified Rankin: Slight disability     Balance Overall balance assessment: Needs assistance Sitting-balance support: Feet supported;No upper extremity supported Sitting balance-Leahy Scale: Good Sitting balance - Comments: able to don shoes without difficulty   Standing balance support: No upper extremity supported Standing balance-Leahy Scale: Fair Standing balance comment: pt staggering during ambulation                 Standardized Balance Assessment Standardized Balance Assessment : Dynamic Gait Index   Dynamic Gait Index Level Surface: Mild Impairment Change in Gait Speed: Mild Impairment Gait with Horizontal Head Turns: Mild Impairment Gait with Vertical Head Turns: Mild Impairment Gait and Pivot Turn: Mild Impairment Step Over Obstacle: Mild Impairment Step Around Obstacles: Mild Impairment Steps: Mild Impairment Total Score: 16       Pertinent Vitals/Pain Pain Assessment: No/denies pain    Home Living Family/patient expects to be discharged to:: Private residence Living Arrangements: Spouse/significant other Available Help at Discharge: Family;Available 24 hours/day Type of Home: House Home Access: Stairs to enter   [employer] of Steps: 2 Home Layout: Two level Home Equipment: None      Prior Function Level of Independence: Independent         Comments: was driving     Hand Dominance   Dominant Hand: Right    Extremity/Trunk Assessment   Upper Extremity Assessment Upper Extremity Assessment: RUE deficits/detail RUE Deficits / Details: mild weakness 4/5    Lower Extremity Assessment Lower Extremity Assessment: Overall WFL for tasks assessed    Cervical / Trunk  Assessment Cervical / Trunk Assessment: Normal  Communication   Communication: No difficulties  Cognition Arousal/Alertness: Awake/alert Behavior During Therapy: Impulsive;Restless Overall Cognitive Status: History of cognitive impairments - at baseline                                 General Comments: pt with h/o bipolar and frontal meningioma. Pt with noted memory impairment, (didn't remember PT coming in early in AM)       General Comments      Exercises     Assessment/Plan    PT Assessment Patient needs continued PT services  PT Problem List Decreased range of motion;Decreased activity tolerance;Decreased balance;Decreased mobility;Decreased coordination;Decreased cognition;Decreased knowledge of use of DME;Decreased safety awareness       PT Treatment Interventions DME instruction;Gait training;Stair training;Functional mobility training;Therapeutic activities;Therapeutic exercise;Balance training;Neuromuscular re-education;Cognitive remediation    PT Goals (Current goals can be found in the Care Plan section)  Acute Rehab PT Goals Patient Stated Goal: home PT Goal Formulation: With patient/family Time For Goal Achievement: 11/07/17 Potential to Achieve Goals: Good Additional Goals Additional Goal #1: Pt to score >19 on DGI to indicate minimal falls risk.    Frequency Min 3X/week   Barriers to discharge        Co-evaluation               AM-PAC PT "6 Clicks" Daily Activity  Outcome Measure Difficulty turning over in bed (including adjusting bedclothes, sheets and blankets)?: None Difficulty moving from lying on back to sitting on the side of the bed? : None Difficulty sitting down on and standing up from a chair with arms (e.g., wheelchair, bedside commode, etc,.)?: None Help needed moving to and from a bed to chair (including a wheelchair)?: A Little Help needed walking in hospital room?: A Little Help needed climbing 3-5 steps with a  railing? : A Little 6 Click Score: 21    End of Session Equipment Utilized During Treatment: Gait belt Activity Tolerance: Patient tolerated treatment well Patient left: in bed;with call bell/phone within reach;with bed alarm set;with family/visitor present Nurse Communication: Mobility status PT Visit Diagnosis: Unsteadiness on feet (R26.81)    Time: 2683-4196 PT Time Calculation (min) (ACUTE ONLY): 17 min   Charges:   PT Evaluation $PT Eval Moderate Complexity: 1 Mod     PT G CodesKittie Plater, PT, DPT Pager #: [phone] Office #: [phone]   [place] 10/24/2017, 2:35 PM

## 2017-10-24 NOTE — Progress Notes (Signed)
OT Cancellation Note  Patient Details Name: [name] MRN: [id number] DOB: 21-Aug-1946   Cancelled Treatment:    Reason Eval/Treat Not Completed: Patient declined, no reason specified. Pt reporting "they don't even know if I had a stroke, I really don't want to waste my time on all of these silly things if I don't need to." Educated pt on role of acute OT; pt continues to decline participation. Will follow up for OT eval as time allows.  Binnie Kand M.S., OTR/L Pager: [phone]  10/24/2017, 10:14 AM

## 2017-10-24 NOTE — Evaluation (Signed)
Speech Language Pathology Evaluation Patient Details Name: SHAKEDRA BEAM MRN: [id number] DOB: 1946/08/03 Today's Date: 10/24/2017 Time: 1300-1330 SLP Time Calculation (min) (ACUTE ONLY): 30 min  Problem List:  Patient Active Problem List   Diagnosis Date Noted  . CVA (cerebral vascular accident) ([place]) 10/23/2017  . Critical lower limb ischemia 11/08/2016  . Tobacco abuse 11/08/2016  . 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 Cigarette smoker 12/24/2015  . Hypothyroid ? 01/16/2014  . Mild cognitive impairment 01/16/2014  . Meningioma ([place]) 10/21/2013  . Chest pain 10/20/2013  . Medicare annual wellness visit, subsequent 06/16/2013  . Dizziness and giddiness 06/16/2013  . RLS (restless legs syndrome) 04/29/2012  . Abdominal pain 12/27/2010  . DEGENERATIVE JOINT DISEASE 09/23/2010  . CAROTID ARTERY DISEASE 08/15/2010  . CHEST PAIN 08/15/2010  . HEADACHE 12/02/2009  . BACK PAIN 11/22/2009  . CONSTIPATION, CHRONIC 01/29/2008  . NAUSEA 01/28/2008  . DEPRESSION 03/08/2007  . Essential hypertension 03/08/2007   Past Medical History:  Past Medical History:  Diagnosis Date  . Bipolar disorder ([place])   . CAD (coronary artery disease)    intra and extracranial vascular dx per MRI 4/11, neurology rec strict CVRF control  . Colonic inertia   . Constipation    chronic;severe  . Depression   . Duodenitis   . EKG abnormalities    changes, stress test neg (false EKG changes)  . Gastritis   . GERD (gastroesophageal reflux disease)   . Hypertension   . Hypothyroid 01/16/2014  . Meningioma ([place]) 10/21/2013  . Psoriasis    sees derm   Past Surgical History:  Past Surgical History:  Procedure Laterality Date  . arthroscopy  04/2010   Right knee  . TUBAL LIGATION     HPI:  72 y.o. female, with past medical history significant for hypertension, coronary artery disease and bipolar disorder presenting today with few hours a history of right arm weakness, slurred speech and  weakness.  CT negative; pt unable to complete MRI.   Assessment / Plan / Recommendation Clinical Impression  Pt presents with dysarthric speech, which is generally intelligible, but with occasional distortions during running speech.  Expressive/receptive language is intact.  Pt voicing concerns over home situation, with husband who is showing signs of dementia, anger issues.  She stays awake until 3 or 4 am and sleeps until noon in order to get some "alone" time. Recommended she seek assistance from his PCP and find support for herself.  Will follow acutely for speech.      SLP Assessment  SLP Recommendation/Assessment: Patient needs continued Speech Lanaguage Pathology Services    Follow Up Recommendations  Other (comment)(tba)    Frequency and Duration min 1 x/week  1 week      SLP Evaluation Cognition  Overall Cognitive Status: History of cognitive impairments - at baseline Arousal/Alertness: Awake/alert Orientation Level: Oriented X4       Comprehension  Auditory Comprehension Overall Auditory Comprehension: Appears within functional limits for tasks assessed Yes/No Questions: Within Functional Limits Commands: Within Functional Limits Visual Recognition/Discrimination Discrimination: Within Function Limits Reading Comprehension Reading Status: Within funtional limits    Expression Expression Primary Mode of Expression: Verbal Verbal Expression Overall Verbal Expression: Appears within functional limits for tasks assessed Initiation: No impairment Repetition: No impairment Naming: No impairment Written Expression Dominant Hand: Right Written Expression: (content appropriate)   Oral / Motor  Oral Motor/Sensory Function Overall Oral Motor/Sensory Function: Moderate impairment Facial ROM: Reduced right;Suspected CN VII (facial) dysfunction Facial Symmetry: Abnormal symmetry right;Suspected  CN VII (facial) dysfunction Lingual Symmetry: Within Functional Limits Motor  Speech Overall Motor Speech: Impaired Respiration: Within functional limits Phonation: Normal Resonance: Within functional limits Articulation: Impaired Level of Impairment: Conversation Intelligibility: Intelligibility reduced Word: 75-100% accurate Phrase: 75-100% accurate Sentence: 75-100% accurate Conversation: 75-100% accurate Motor Planning: Witnin functional limits   GO                    Juan Quam Laurice 10/24/2017, 2:22 PM

## 2017-10-24 NOTE — Progress Notes (Signed)
  Echocardiogram 2D Echocardiogram has been performed.  Darlina Sicilian M 10/24/2017, 2:20 PM

## 2017-10-24 NOTE — Progress Notes (Signed)
[facility]                                                                              Patient Demographics  [name], is a 72 y.o. female, DOB - 01/12/1946, HER:[id number]  Admit date - 10/23/2017   Admitting Physician Merton Border, MD  Outpatient Primary MD for the patient is No primary care provider on file.  Outpatient specialists:   LOS - 1  days   Medical records reviewed and are as summarized below:    Chief Complaint  Patient presents with  . Code Stroke       Brief summary   [name]  is a 72 y.o. female, with past medical history significant for hypertension, coronary artery disease and bipolar disorder presenting today with few hours a history of right arm weakness, slurred speech and weakness. The patient woke up in the morning with right hand weakness, headache and went to her hairdresser later on where she was picked up by her husband who noticed right facial droop and slurred speech. Patient complains of unable to use her right hand for writing. Patient denies visual symptoms, vomiting, diarrhea or leg weakness.   Assessment & Plan    Principal problem CVA (cerebral vascular accident) ([place]) - CT head negative for any hemorrhage or mass lesion, 11 mm left frontal convexity meningioma unchanged - Patient unable to do MRI brain this morning due to claustrophobia -Discussed with neurology, recommended CT angiogram of the head and neck - Follow 2-D echo, PTOT evaluation - Continue aspirin 325 mg daily, - LDL 104, goal less than 70, placed on Lipitor 40 mg daily - Hemoglobin A1c 5.8      Essential hypertension - BP currently elevated, follow closely, permissive hypertension for 24 hours    Tobacco abuse - Patient counseled on diet and weight control   Code Status:full  DVT Prophylaxis:   heparin  Family Communication: Discussed in detail with the patient, all imaging results, lab results explained to the patient   Disposition  Plan:  Time Spent in minutes  25 minutes  Procedures:    Consultants:   Neurology   Antimicrobials:      Medications  Scheduled Meds: . amLODipine  2.5 mg Oral Daily  . aspirin EC  325 mg Oral Daily  . clonazePAM  0.5 mg Oral QHS  . DULoxetine  60 mg Oral Daily  . famotidine  20 mg Oral QHS  . heparin  5,000 Units Subcutaneous Q8H  . lamoTRIgine  100 mg Oral Daily  . Magnesium Gluconate  1,000 mg Oral Daily  . polyethylene glycol  17 g Oral Daily  . rOPINIRole  2 mg Oral QHS  . senna  1 tablet Oral QHS   Continuous Infusions: PRN Meds:.acetaminophen **OR** acetaminophen (TYLENOL) oral liquid 160 mg/5 mL **OR** acetaminophen, labetalol   Antibiotics   Anti-infectives (From admission, onward)   None        Subjective:   [name] was seen and examined today.Overall feels better however was unable to do the MRI due to claustrophobia. No acute issues overnight, no fevers or chills. Still feels some right-sided weakness.  Patient denies dizziness, chest pain, shortness of breath, abdominal pain, N/V/D/C.   Objective:   Vitals:   10/24/17 0221 10/24/17 0421 10/24/17 0554 10/24/17 1044  BP: (!) 168/78 (!) 164/68 (!) 162/66 (!) (P) 166/72  Pulse: 66 66 (!) 55 (P) 61  Resp: 18 16 18  (P) 18  Temp: 98.2 F (36.8 C) 98.4 F (36.9 C) 98 F (36.7 C) (P) 97.9 F (36.6 C)  TempSrc: Oral Oral Oral (P) Oral  SpO2: 96% 97% 96% (P) 96%  Weight:      Height:        Intake/Output Summary (Last 24 hours) at 10/24/2017 1152 Last data filed at 10/24/2017 0900 Gross per 24 hour  Intake 920 ml  Output 1300 ml  Net -380 ml     Wt Readings from Last 3 Encounters:  10/23/17 69.4 kg (153 lb)  11/17/16 72.6 kg (160 lb)  11/16/16 73 kg (161 lb)     Exam  General: Alert and oriented x 3, NAD, Mildly dysarthric speech   Eyes: PERRLA, EOMI, Anicteric Sclera,  HEENT:  Atraumatic, normocephalic, normal oropharynx  Cardiovascular: S1 S2 auscultated, no rubs, murmurs  or gallops. Regular rate and rhythm.  Respiratory: Clear to auscultation bilaterally, no wheezing, rales or rhonchi  Gastrointestinal: Soft, nontender, nondistended, + bowel sounds  Ext: no pedal edema bilaterally  Neuro: strength 5/ 5 on the left UE, LE. On the right 4-5/5  Musculoskeletal: No digital cyanosis, clubbing  Skin: No rashes  Psych: Normal affect and demeanor, alert and oriented x3    Data Reviewed:  I have personally reviewed following labs and imaging studies  Micro Results No results found for this or any previous visit (from the past 240 hour(s)).  Radiology Reports Ct Head Code Stroke Wo Contrast  Result Date: 10/23/2017 CLINICAL DATA:  Code stroke.  Headache and right-sided facial droop EXAM: CT HEAD WITHOUT CONTRAST TECHNIQUE: Contiguous axial images were obtained from the base of the skull through the vertex without intravenous contrast. COMPARISON:  Head CT 01/15/2017 FINDINGS: Brain: No mass lesion or acute hemorrhage. No focal hypoattenuation of the basal ganglia or cortex to indicate infarcted tissue. There is periventricular hypoattenuation compatible with chronic microvascular disease. Area of hypodensity in the left insula is unchanged. Left frontal convexity meningioma measures 11 mm. Vascular: No hyperdense vessel. No advanced atherosclerotic calcification of the arteries at the skull base. Skull: Normal visualized skull base, calvarium and extracranial soft tissues. Sinuses/Orbits: No sinus fluid levels or advanced mucosal thickening. No mastoid effusion. Normal orbits. ASPECTS [facility] Stroke Program Early CT Score) - Ganglionic level infarction (caudate, lentiform nuclei, internal capsule, insula, M1-M3 cortex): 7 - Supraganglionic infarction (M4-M6 cortex): 3 Total score (0-10 with 10 being normal): 10 IMPRESSION: 1. No hemorrhage or mass lesion. 2. ASPECTS is 10. 3. Chronic ischemic microangiopathy. 4. 11 mm left frontal convexity meningioma, unchanged.  These results were communicated to Dr. Kerney Elbe at 4:45 pm on 10/23/2017 by text page via the [facility] messaging system. Electronically Signed   By: Ulyses Jarred M.D.   On: 10/23/2017 16:45    Lab Data:  CBC: Recent Labs  Lab 10/23/17 1630 10/23/17 1633 10/23/17 2029 10/24/17 0846  WBC 7.6  --  7.2 8.3  NEUTROABS 4.0  --   --   --   HGB 13.0 13.6 13.0 12.9  HCT 39.7 40.0 39.8 40.3  MCV 93.4  --  92.8 93.7  PLT 202  --  204 619   Basic Metabolic Panel: Recent Labs  Lab 10/23/17 1630 10/23/17 1633 10/23/17 2029 10/24/17 0846  NA 136 139  --  140  K 3.8 3.8  --  3.9  CL 99* 96*  --  101  CO2 27  --   --  30  GLUCOSE 102* 93  --  122*  BUN 15 18  --  12  CREATININE 0.82 0.80 0.90 0.79  CALCIUM 9.2  --   --  9.3   GFR: Estimated Creatinine Clearance: 64.5 mL/min (by C-G formula based on SCr of 0.79 mg/dL). Liver Function Tests: Recent Labs  Lab 10/23/17 1630  AST 21  ALT 19  ALKPHOS 65  BILITOT 0.4  PROT 6.5  ALBUMIN 3.5   No results for input(s): LIPASE, AMYLASE in the last 168 hours. No results for input(s): AMMONIA in the last 168 hours. Coagulation Profile: Recent Labs  Lab 10/23/17 1630  INR 1.01   Cardiac Enzymes: No results for input(s): CKTOTAL, CKMB, CKMBINDEX, TROPONINI in the last 168 hours. BNP (last 3 results) No results for input(s): PROBNP in the last 8760 hours. HbA1C: Recent Labs    10/24/17 0846  HGBA1C 5.8*   CBG: Recent Labs  Lab 10/23/17 1629  GLUCAP 93   Lipid Profile: Recent Labs    10/24/17 0846  CHOL 175  HDL 55  LDLCALC 104*  TRIG 80  CHOLHDL 3.2   Thyroid Function Tests: No results for input(s): TSH, T4TOTAL, FREET4, T3FREE, THYROIDAB in the last 72 hours. Anemia Panel: No results for input(s): VITAMINB12, FOLATE, FERRITIN, TIBC, IRON, RETICCTPCT in the last 72 hours. Urine analysis:    Component Value Date/Time   COLORURINE STRAW (A) 10/23/2017 1822   APPEARANCEUR CLEAR 10/23/2017 1822   LABSPEC 1.004  (L) 10/23/2017 1822   PHURINE 7.0 10/23/2017 1822   GLUCOSEU NEGATIVE 10/23/2017 1822   HGBUR SMALL (A) 10/23/2017 1822   BILIRUBINUR NEGATIVE 10/23/2017 1822   BILIRUBINUR neg 07/31/2013 1752   KETONESUR NEGATIVE 10/23/2017 1822   PROTEINUR NEGATIVE 10/23/2017 1822   UROBILINOGEN 0.2 07/31/2013 1752   NITRITE NEGATIVE 10/23/2017 1822   LEUKOCYTESUR NEGATIVE 10/23/2017 1822     Ripudeep Rai M.D. [facility] 10/24/2017, 11:52 AM  Pager: [phone] Between 7am to 7pm - call Pager - [phone]  After 7pm go to [email or web address] - password [email or web address]  Call night coverage person covering after 7pm

## 2017-10-25 LAB — ECHOCARDIOGRAM COMPLETE
Height: 69 in
Weight: 2448 oz

## 2017-10-25 MED ORDER — LOSARTAN POTASSIUM 50 MG PO TABS: 50.0000 mg | ORAL_TABLET | Freq: Every day | ORAL | 4 refills | Status: DC

## 2017-10-25 MED ORDER — LABETALOL HCL 5 MG/ML IV SOLN
5.0000 mg | INTRAVENOUS | Status: DC | PRN
  Administered 2017-10-25 (×2): 5 mg via INTRAVENOUS
  Filled 2017-10-25 (×2): qty 4

## 2017-10-25 MED ORDER — ATORVASTATIN CALCIUM 40 MG PO TABS: 40.0000 mg | ORAL_TABLET | Freq: Every day | ORAL | 4 refills | Status: DC

## 2017-10-25 MED ORDER — AMLODIPINE BESYLATE 10 MG PO TABS: 10.0000 mg | ORAL_TABLET | Freq: Every day | ORAL | 4 refills | Status: DC

## 2017-10-25 MED ORDER — ASPIRIN 325 MG PO TBEC: 325.0000 mg | DELAYED_RELEASE_TABLET | Freq: Every day | ORAL | 4 refills | Status: DC

## 2017-10-25 NOTE — Care Management Note (Signed)
Case Management Note  Patient Details  Name: BRITNEE MCDEVITT MRN: [id number] Date of Birth: 1946-09-10  Subjective/Objective:    Pt admitted with CVA. She is from home with her spouse.               Action/Plan: CM consulted for PCP and outpatient therapy. CM met with the patient and she has seen Dr Larose Kells in the past and would like to see him again. CM called his office and was able to obtain her a reestablishing appointment. Information on the AVS. CM also discussed outpatient therapy. She is interested in attending [employer]. Orders in Epic and information on the AVS.  Family able to provide supervision at home and transportation home.   Expected Discharge Date:  10/25/17               Expected Discharge Plan:  OP Rehab  In-House Referral:     Discharge planning Services  CM Consult  Post Acute Care Choice:    Choice offered to:     DME Arranged:    DME Agency:     HH Arranged:    HH Agency:     Status of Service:  Completed, signed off  If discussed at [employer] of Stay Meetings, dates discussed:    Additional Comments:  Pollie Friar, RN 10/25/2017, 1:02 PM

## 2017-10-25 NOTE — Progress Notes (Addendum)
OT Evaluation  PTA, pt independent with ADL and mobility. Pt occasionally drove. Pt plans to DC home. Pt will need to follow up with OT at the neuro outpt center. Pt easily agitated aand states it is because she is not taking her medicine for her nerves. Pt became tearful and stated "it's just too much". Discussed normal response to having a CVA and encouraged pt to discuss this with her MD if it continues to be a problem.During second visit began education on HEP and home safety. Written information provided and reviewed. Recommend pt refrain from driving at this time.    10/25/17 1400  OT Visit Information  Last OT Received On 10/25/17  Assistance Needed +1  History of Present Illness 72 y.o.female,with past medical history significant for hypertension, coronary artery disease and bipolar disorder presenting today with few hours a history of right arm weakness, slurred speech and weakness. Pt with h/o frontal meningioma. MRI significant for acute lacunar infarct of the  Precautions  Precautions Fall  Precaution Comments unsteady and impulsive  Restrictions  Weight Bearing Restrictions No  Home Living  Family/patient expects to be discharged to: Private residence  Living Arrangements Spouse/significant other  Available Help at Discharge Family;Available 24 hours/day  Type of Home House  Home Access Stairs to enter  Entrance Stairs-Number of Steps 2  Home Layout Two level  Alternate Level Stairs-Number of Steps flight  Alternate Level Stairs-Rails Can reach both  Bathroom Shower/Tub Walk-in shower  Bathroom Toilet Handicapped height  Bathroom Accessibility Yes  How Accessible Accessible via walker  [place] - built in  Lives With Spouse  Prior Function  Level of Independence Independent  Comments was driving  Communication  Communication No difficulties  Pain Assessment  Pain Assessment Faces  Faces Pain Scale 4  Pain Location back  Pain Descriptors /  Indicators Aching;Discomfort  Pain Intervention(s) Limited activity within patient's tolerance  Cognition  Arousal/Alertness Awake/alert  Behavior During Therapy Impulsive;Restless  Overall Cognitive Status Impaired/Different from baseline  Area of Impairment Memory;Safety/judgement  Memory Decreased short-term memory (remembered 1/3 items)  Following Commands Follows one step commands consistently  Safety/Judgement Decreased awareness of safety;Decreased awareness of deficits  General Comments pt with h/o bipolar and frontal meningioma  Upper Extremity Assessment  Upper Extremity Assessment RUE deficits/detail  RUE Deficits / Details mild weakness; difficulty with coordination adn fine motor skills and BUE integrated [place] eo f BUE  RUE Sensation decreased light touch  RUE Coordination decreased fine motor  Lower Extremity Assessment  Lower Extremity Assessment Defer to PT evaluation  Cervical / Trunk Assessment  Cervical / Trunk Assessment Normal  ADL  Overall ADL's  Needs assistance/impaired  General ADL Comments Requires overall S with ADL tasks. Pt has difficulty holding onto toothbrush and utnesils; pt given red tubing to help maintain grasp on toothbrusha dn utensils; given pencil grips; Educated on home safety adn reducing ridsk of falls; recommended pt use a shower chari for bathing. Pt/husband verbalized understanding. Discussed UE impariments and risk of  injury due to sensory and coordination changes. Pt/husband verbalized understanding.  Vision- History  Baseline Vision/History Wears glasses  Bed Mobility  Overal bed mobility Modified Independent  Transfers  Overall transfer level Modified independent  Balance  Overall balance assessment Needs assistance  Sitting-balance support Feet supported;No upper extremity supported  Sitting balance-Leahy Scale Good  Sitting balance - Comments able to don shoes without difficulty  Standing balance support No upper extremity  supported  Standing balance-Leahy Scale Fair  Single Leg Stance - Right Leg   Single Leg Stance - Left Leg   Tandem Stance - Right Leg   Tandem Stance - Left Leg   Exercises  Exercises Other exercises  Other Exercises  Other Exercises fine motor/coordination - handout reviewed  Other Exercises theraputty - med - handout provided and reviewed  Other Exercises BUE integrated actuivity - pt verbalized understanding  OT - End of Session  Activity Tolerance Patient tolerated treatment well  Patient left in bed;with call bell/phone within reach;with family/visitor present  Nurse Communication Mobility status;Other (comment) (DC needs)  OT Assessment  OT Recommendation/Assessment All further OT needs can be met in the next venue of care  OT Visit Diagnosis Unsteadiness on feet (R26.81);Muscle weakness (generalized) (M62.81);Other symptoms and signs involving cognitive function  OT Problem List Decreased strength;Impaired balance (sitting and/or standing);Decreased coordination;Decreased safety awareness;Impaired sensation;Impaired UE functional use  AM-PAC OT "6 Clicks" Daily Activity Outcome Measure  Help from another person eating meals? 3  Help from another person taking care of personal grooming? 3  Help from another person toileting, which includes using toliet, bedpan, or urinal? 4  Help from another person bathing (including washing, rinsing, drying)? 3  Help from another person to put on and taking off regular upper body clothing? 4  Help from another person to put on and taking off regular lower body clothing? 3  6 Click Score 20  ADL G Code Conversion CJ  OT Recommendation  Follow Up Recommendations Outpatient OT;Supervision/Assistance - 24 hour (initially)  OT Equipment Tub/shower seat  Individuals Consulted  Consulted and Agree with Results and Recommendations Patient;Family member/caregiver  Family Member Consulted husbnad  Acute Rehab OT Goals  Patient Stated Goal to go  home  OT Goal Formulation All assessment and education complete, DC therapy (continue with outpt OT)  OT Time Calculation  OT Start Time (ACUTE ONLY) 1345     1427  OT Stop Time (ACUTE ONLY) 1400      1447  OT Time Calculation (min) 15 min     20 min  OT General Charges  $OT Visit (2 visits)  OT Evaluation  $OT Eval Moderate Complexity 1 Mod  OT Treatments  $Therapeutic Activity 8-22 mins  Written Expression  Dominant Hand Right  [facility], OT/L  [phone] 10/25/2017

## 2017-10-25 NOTE — Progress Notes (Signed)
  Pt reports weakness and wanted blood pressure checked, BP checked and documented.

## 2017-10-25 NOTE — Progress Notes (Signed)
Pt d/c home, no new concerns, pt is stable. D/C instructions done with teach back, pt verbalize understanding. Pt is transported out of the hospital by family member (spouse).

## 2017-10-25 NOTE — Progress Notes (Signed)
Physical Therapy Treatment Patient Details Name: TEYLA SKIDGEL MRN: [id number] DOB: [birth year] Today's Date: 10/25/2017    History of Present Illness 72 y.o.female,with past medical history significant for hypertension, coronary artery disease and bipolar disorder presenting today with few hours a history of right arm weakness, slurred speech and weakness. Pt with h/o frontal meningioma. MRI significant for acute lacunar infarct of the posterior left corona radiata tracking toward the posterior left lentiform nuclei.    PT Comments    Pt progressing towards functional mobility goals. Pt with impaired safety awareness and judgement. Supervision for static standing, bed mobility, and transfers. Min guard for ambulation due to noted impulsivity and unsteady staggering gait. Current plan remains appropriate due to increased fall risk (DGI 16/24 today). Will continue to follow acutely and progress as tolerated to maximize functional mobility, safety, and independence prior to d/c home.    Follow Up Recommendations  Outpatient PT;Supervision/Assistance - 24 hour     Equipment Recommendations  None recommended by PT    Recommendations for Other Services       Precautions / Restrictions Precautions Precautions: Fall Precaution Comments: unsteady and impulsive Restrictions Weight Bearing Restrictions: No    Mobility  Bed Mobility Overal bed mobility: Needs Assistance Bed Mobility: Sit to Supine       Sit to supine: Supervision;HOB elevated   General bed mobility comments: supervision for safety due to instability and impulsivity  Transfers Overall transfer level: Needs assistance Equipment used: None Transfers: Sit to/from Stand Sit to Stand: Supervision         General transfer comment: supervision for safety due to impulsivity  Ambulation/Gait Ambulation/Gait assistance: Min guard Ambulation Distance (Feet): 400 Feet Assistive device: None Gait Pattern/deviations:  Staggering left;Staggering right;Step-through pattern Gait velocity: WFL Gait velocity interpretation: at or above normal speed for age/gender General Gait Details: Prominent bil staggering but able to recover without LOB. Pt with decreased awareness of balance defecits.   Stairs            Wheelchair Mobility    Modified Rankin (Stroke Patients Only)       Balance Overall balance assessment: Needs assistance Sitting-balance support: Feet supported;No upper extremity supported Sitting balance-Leahy Scale: Good     Standing balance support: No upper extremity supported Standing balance-Leahy Scale: Fair Standing balance comment: prominent staggering with ambulation, pt in unsupported static standing at sink curling her hair at initiation of session Single Leg Stance - Right Leg: (3 sec) Single Leg Stance - Left Leg: (15 sec) Tandem Stance - Right Leg: (8 sec) Tandem Stance - Left Leg: (5 sec)             Dynamic Gait Index Level Surface: Mild Impairment Change in Gait Speed: Mild Impairment Gait with Horizontal Head Turns: Mild Impairment Gait with Vertical Head Turns: Mild Impairment Gait and Pivot Turn: Mild Impairment Step Over Obstacle: Mild Impairment Step Around Obstacles: Mild Impairment Steps: Mild Impairment Total Score: 16      Cognition Arousal/Alertness: Awake/alert Behavior During Therapy: Impulsive;Restless Overall Cognitive Status: Impaired/Different from baseline Area of Impairment: Memory;Following commands;Safety/judgement                     Memory: Decreased short-term memory(remembered 1/3 items) Following Commands: Follows multi-step commands inconsistently Safety/Judgement: Decreased awareness of safety;Decreased awareness of deficits     General Comments: pt with h/o bipolar and frontal meningioma      Exercises      General Comments General comments (skin integrity, edema, etc.):  she has some increased "fuzziness"  with her vision.      Pertinent Vitals/Pain Pain Assessment: No/denies pain    Home Living                      Prior Function            PT Goals (current goals can now be found in the care plan section) Acute Rehab PT Goals Patient Stated Goal: to go home PT Goal Formulation: With patient/family Time For Goal Achievement: 11/07/17 Potential to Achieve Goals: Good Progress towards PT goals: Progressing toward goals    Frequency    Min 3X/week      PT Plan Current plan remains appropriate    Co-evaluation              AM-PAC PT "6 Clicks" Daily Activity  Outcome Measure  Difficulty turning over in bed (including adjusting bedclothes, sheets and blankets)?: None Difficulty moving from lying on back to sitting on the side of the bed? : None Difficulty sitting down on and standing up from a chair with arms (e.g., wheelchair, bedside commode, etc,.)?: None Help needed moving to and from a bed to chair (including a wheelchair)?: A Little Help needed walking in hospital room?: A Little Help needed climbing 3-5 steps with a railing? : A Little 6 Click Score: 21    End of Session Equipment Utilized During Treatment: (pt refused use of gait belt during session stating "I won't embarrass myself with that thing") Activity Tolerance: Patient tolerated treatment well Patient left: in bed;with call bell/phone within reach;with family/visitor present Nurse Communication: Mobility status PT Visit Diagnosis: Unsteadiness on feet (R26.81)     Time: 0300-9233 PT Time Calculation (min) (ACUTE ONLY): 16 min  Charges:  $Neuromuscular Re-education: 8-22 mins                    G Codes:       Vic Ripper, SPT    Vic Ripper 10/25/2017, 11:47 AM

## 2017-10-25 NOTE — Discharge Summary (Signed)
Physician Discharge Summary   Patient ID: CINA KLUMPP MRN: [id number] DOB/AGE: [birth year] 72 y.o.  Admit date: 10/23/2017 Discharge date: 10/25/2017  Primary Care Physician:  Patient states that she does not have one primary care physician she closely follows  Discharge Diagnoses:    . CVA (cerebral vascular accident) ([place]) . Essential hypertension . Tobacco abuse   Hyperlipidemia   Consults: Neurology  Recommendations for Outpatient Follow-up:  1. Outpatient PTOT 2. Please repeat CBC/BMET at next visit 3. Please follow blood/urine cultures till final   DIET: Heart healthy diet    Allergies:  No Known Allergies   DISCHARGE MEDICATIONS: Allergies as of 10/25/2017   No Known Allergies     Medication List    TAKE these medications   amLODipine 10 MG tablet Commonly known as:  NORVASC Take 1 tablet (10 mg total) by mouth daily. What changed:    medication strength  how much to take   aspirin 325 MG EC tablet Take 1 tablet (325 mg total) by mouth daily.   atorvastatin 40 MG tablet Commonly known as:  LIPITOR Take 1 tablet (40 mg total) by mouth at bedtime.   bisacodyl 5 MG EC tablet Commonly known as:  DULCOLAX Take 5 mg by mouth daily as needed for constipation.   clonazePAM 2 MG tablet Commonly known as:  KLONOPIN Take 0.5 mg by mouth at bedtime.   DULoxetine 30 MG capsule Commonly known as:  CYMBALTA Take 60 mg by mouth daily.   famotidine 20 MG tablet Commonly known as:  PEPCID One at bedtime   LamoTRIgine 100 MG Tbdp Take 100 mg by mouth daily.   losartan 50 MG tablet Commonly known as:  COZAAR Take 1 tablet (50 mg total) by mouth daily.   magnesium gluconate 500 MG tablet Commonly known as:  MAGONATE Take 1,000 mg by mouth daily.   nitroGLYCERIN 0.4 MG SL tablet Commonly known as:  NITROSTAT Place 1 tablet (0.4 mg total) under the tongue every 5 (five) minutes as needed for chest pain.   polyethylene glycol packet Commonly  known as:  MIRALAX / GLYCOLAX Take 17 g by mouth daily.   rOPINIRole 2 MG tablet Commonly known as:  REQUIP TAKE 1 TABLET BY MOUTH AT BEDTIME What changed:    how much to take  how to take this  when to take this   senna 8.6 MG Tabs tablet Commonly known as:  SENOKOT Take 1 tablet by mouth at bedtime.        Brief H and P: For complete details please refer to admission H and P, but in briefCarolBobergis a72 y.o.female,with past medical history significant for hypertension, coronary artery disease and bipolar disorder presenting today with few hours a history of right arm weakness, slurred speech and weakness. The patient woke up in the morning with right hand weakness, headache and went to her hairdresser later on where she was picked up by her husband who noticed right facial droop and slurred speech. Patient complains of unable to use her right hand for writing. Patient denies visual symptoms, vomiting, diarrheaor leg weakness.   Hospital Course:   CVA (cerebral vascular accident) ([place]) - CT head negative for any hemorrhage or mass lesion, 11 mm left frontal convexity meningioma unchanged -  CT angiogram of the head and neck showed no emergent large vessel occlusion or high-grade intracranial stenosis, moderate stenosis at the origin of the left vertebral artery which is otherwise normal. The carotid bifurcation and aortic atherosclerosis with no  hemodynamically significant stenosis.  -  PT evaluation recommended outpatient PT, OT - 2-D echo showed EF of 65-70% with grade 1 diastolic dysfunction. Compared to prior study in 2015, now moderate to severe LVH. - Continue aspirin 325 mg daily, patient was strongly recommended compliance with her medications. - LDL 104, goal less than 70, placed on Lipitor 40 mg daily - Hemoglobin A1c 5.8      Essential hypertension -Patient was started on Norvasc 10 mg daily, losartan 50 mg daily, recommended compliance with her  medications.    Tobacco abuse - Patient counseled to quit smoking  Emphysema on the CT angiogram - Currently no wheezing, patient strongly recommended to quit smoking    Day of Discharge BP (!) 147/57 (BP Location: Left Arm)   Pulse (!) 59   Temp 97.8 F (36.6 C) (Oral)   Resp 18   Ht 5\' 9"  (1.753 m)   Wt 69.4 kg (153 lb)   SpO2 99%   BMI 22.59 kg/m   Physical Exam: General: Alert and awake oriented x3 not in any acute distress. HEENT: anicteric sclera, pupils reactive to light and accommodation CVS: S1-S2 clear no murmur rubs or gallops Chest: clear to auscultation bilaterally, no wheezing rales or rhonchi Abdomen: soft nontender, nondistended, normal bowel sounds Extremities: no cyanosis, clubbing or edema noted bilaterally Neuro: Cranial nerves II-XII intact, no focal neurological deficits   The results of significant diagnostics from this hospitalization (including imaging, microbiology, ancillary and laboratory) are listed below for reference.    LAB RESULTS: Basic Metabolic Panel: Recent Labs  Lab 10/23/17 1630 10/23/17 1633 10/23/17 2029 10/24/17 0846  NA 136 139  --  140  K 3.8 3.8  --  3.9  CL 99* 96*  --  101  CO2 27  --   --  30  GLUCOSE 102* 93  --  122*  BUN 15 18  --  12  CREATININE 0.82 0.80 0.90 0.79  CALCIUM 9.2  --   --  9.3   Liver Function Tests: Recent Labs  Lab 10/23/17 1630  AST 21  ALT 19  ALKPHOS 65  BILITOT 0.4  PROT 6.5  ALBUMIN 3.5   No results for input(s): LIPASE, AMYLASE in the last 168 hours. No results for input(s): AMMONIA in the last 168 hours. CBC: Recent Labs  Lab 10/23/17 1630  10/23/17 2029 10/24/17 0846  WBC 7.6  --  7.2 8.3  NEUTROABS 4.0  --   --   --   HGB 13.0   < > 13.0 12.9  HCT 39.7   < > 39.8 40.3  MCV 93.4  --  92.8 93.7  PLT 202  --  204 206   < > = values in this interval not displayed.   Cardiac Enzymes: No results for input(s): CKTOTAL, CKMB, CKMBINDEX, TROPONINI in the last 168  hours. BNP: Invalid input(s): POCBNP CBG: Recent Labs  Lab 10/23/17 1629  GLUCAP 93    Significant Diagnostic Studies:  Ct Angio Head W Or Wo Contrast  Result Date: 10/24/2017 CLINICAL DATA:  Stroke.  Right-sided weakness. EXAM: CT ANGIOGRAPHY HEAD AND NECK TECHNIQUE: Multidetector CT imaging of the head and neck was performed using the standard protocol during bolus administration of intravenous contrast. Multiplanar CT image reconstructions and MIPs were obtained to evaluate the vascular anatomy. Carotid stenosis measurements (when applicable) are obtained utilizing NASCET criteria, using the distal internal carotid diameter as the denominator. CONTRAST:  29mL ISOVUE-370 IOPAMIDOL (ISOVUE-370) INJECTION 76% COMPARISON:  Head  CT 10/23/2017 FINDINGS: CTA NECK FINDINGS Aortic arch: There is moderate calcific atherosclerosis of the aortic arch. There is no aneurysm, dissection or hemodynamically significant stenosis of the visualized ascending aorta and aortic arch. Conventional 3 vessel aortic branching pattern. The visualized proximal subclavian arteries are normal. Right carotid system: The right common carotid origin is widely patent. There is no common carotid or internal carotid artery dissection or aneurysm. Atherosclerotic calcification at the carotid bifurcation without hemodynamically significant stenosis. Left carotid system: The left common carotid origin is widely patent. There is no common carotid or internal carotid artery dissection or aneurysm. Atherosclerotic calcification at the carotid bifurcation without hemodynamically significant stenosis. Vertebral arteries: The vertebral system is left dominant. There is moderate narrowing of the origin of the left vertebral artery. The right vertebral artery origin is normal. Both vertebral arteries are normal to their confluence with the basilar artery. Skeleton: There is no bony spinal canal stenosis. No lytic or blastic lesions. Other neck:  The nasopharynx is clear. The oropharynx and hypopharynx are normal. The epiglottis is normal. The supraglottic larynx, glottis and subglottic larynx are normal. No retropharyngeal collection. The parapharyngeal spaces are preserved. The parotid and submandibular glands are normal. No sialolithiasis or salivary ductal dilatation. The thyroid gland is normal. There is no cervical lymphadenopathy. Upper chest: Biapical emphysema. Review of the MIP images confirms the above findings CTA HEAD FINDINGS Anterior circulation: --Intracranial internal carotid arteries: Normal. --Anterior cerebral arteries: Normal. --Middle cerebral arteries: Normal. --Posterior communicating arteries: Present on the right, absent on the left. Posterior circulation: --Posterior cerebral arteries: Normal. --Superior cerebellar arteries: Normal. --Basilar artery: Normal. --Anterior inferior cerebellar arteries: Normal. --Posterior inferior cerebellar arteries: Normal. Venous sinuses: As permitted by contrast timing, patent. Anatomic variants: None Delayed phase: Superior left frontal convexity meningioma measures 12 mm. There is periventricular hypoattenuation compatible with chronic microvascular disease. No parenchymal contrast enhancement. Review of the MIP images confirms the above findings. IMPRESSION: 1. No emergent large vessel occlusion or high-grade intracranial stenosis. 2. Moderate stenosis at the origin of the left vertebral artery, which is otherwise normal. 3. Carotid bifurcation and aortic atherosclerosis (ICD10-I70.0) without hemodynamically significant stenosis. 4.  Emphysema (ICD10-J43.9). Electronically Signed   By: Ulyses Jarred M.D.   On: 10/24/2017 17:36   Dg Chest 2 View  Result Date: 10/24/2017 CLINICAL DATA:  CVA (cerebral vascular accident). Pt had a CVA yesterday morning. Hx of CAD, EKG abnormalities, HTN, hypothyroid, current every day smoker 1 pack/day 60 years. EXAM: CHEST  2 VIEW COMPARISON:  CT 01/20/2016  FINDINGS: Normal cardiac silhouette. There is chronic bronchitic markings. Emphysematous upper lobes. Degenerative osteophytosis of the spine. IMPRESSION: Mild chronic interstitial lung disease.  No acute findings. Electronically Signed   By: Suzy Bouchard M.D.   On: 10/24/2017 17:48   Ct Angio Neck W Or Wo Contrast  Result Date: 10/24/2017 CLINICAL DATA:  Stroke.  Right-sided weakness. EXAM: CT ANGIOGRAPHY HEAD AND NECK TECHNIQUE: Multidetector CT imaging of the head and neck was performed using the standard protocol during bolus administration of intravenous contrast. Multiplanar CT image reconstructions and MIPs were obtained to evaluate the vascular anatomy. Carotid stenosis measurements (when applicable) are obtained utilizing NASCET criteria, using the distal internal carotid diameter as the denominator. CONTRAST:  55mL ISOVUE-370 IOPAMIDOL (ISOVUE-370) INJECTION 76% COMPARISON:  Head CT 10/23/2017 FINDINGS: CTA NECK FINDINGS Aortic arch: There is moderate calcific atherosclerosis of the aortic arch. There is no aneurysm, dissection or hemodynamically significant stenosis of the visualized ascending aorta and aortic arch. Conventional 3  vessel aortic branching pattern. The visualized proximal subclavian arteries are normal. Right carotid system: The right common carotid origin is widely patent. There is no common carotid or internal carotid artery dissection or aneurysm. Atherosclerotic calcification at the carotid bifurcation without hemodynamically significant stenosis. Left carotid system: The left common carotid origin is widely patent. There is no common carotid or internal carotid artery dissection or aneurysm. Atherosclerotic calcification at the carotid bifurcation without hemodynamically significant stenosis. Vertebral arteries: The vertebral system is left dominant. There is moderate narrowing of the origin of the left vertebral artery. The right vertebral artery origin is normal. Both  vertebral arteries are normal to their confluence with the basilar artery. Skeleton: There is no bony spinal canal stenosis. No lytic or blastic lesions. Other neck: The nasopharynx is clear. The oropharynx and hypopharynx are normal. The epiglottis is normal. The supraglottic larynx, glottis and subglottic larynx are normal. No retropharyngeal collection. The parapharyngeal spaces are preserved. The parotid and submandibular glands are normal. No sialolithiasis or salivary ductal dilatation. The thyroid gland is normal. There is no cervical lymphadenopathy. Upper chest: Biapical emphysema. Review of the MIP images confirms the above findings CTA HEAD FINDINGS Anterior circulation: --Intracranial internal carotid arteries: Normal. --Anterior cerebral arteries: Normal. --Middle cerebral arteries: Normal. --Posterior communicating arteries: Present on the right, absent on the left. Posterior circulation: --Posterior cerebral arteries: Normal. --Superior cerebellar arteries: Normal. --Basilar artery: Normal. --Anterior inferior cerebellar arteries: Normal. --Posterior inferior cerebellar arteries: Normal. Venous sinuses: As permitted by contrast timing, patent. Anatomic variants: None Delayed phase: Superior left frontal convexity meningioma measures 12 mm. There is periventricular hypoattenuation compatible with chronic microvascular disease. No parenchymal contrast enhancement. Review of the MIP images confirms the above findings. IMPRESSION: 1. No emergent large vessel occlusion or high-grade intracranial stenosis. 2. Moderate stenosis at the origin of the left vertebral artery, which is otherwise normal. 3. Carotid bifurcation and aortic atherosclerosis (ICD10-I70.0) without hemodynamically significant stenosis. 4.  Emphysema (ICD10-J43.9). Electronically Signed   By: Ulyses Jarred M.D.   On: 10/24/2017 17:36   Mr Brain Limited Wo Contrast  Result Date: 10/24/2017 CLINICAL DATA:  72 year old female with code  stroke presentation, right side weakness. CTA today negative for emergent large vessel occlusion. EXAM: LIMITED MRI HEAD WITHOUT CONTRAST TECHNIQUE: By request of Neurology diffusion-weighted imaging of the brain only, without intravenous contrast. COMPARISON:  CTA 1712 hr today and earlier.  Brain MRI 10/03/2015. FINDINGS: Small 10-12 millimeter focus of restricted diffusion tracking from the posterior right corona radiata toward the posterior lentiform nuclei (series 3, image 27 and series 4, image 18). No other restricted diffusion. No midline shift or intracranial mass effect. Stable cerebral volume. No ventriculomegaly. IMPRESSION: Limited brain MRI demonstrating acute lacunar infarct of the posterior left corona radiata tracking toward the posterior left lentiform nuclei. No associated mass effect. Electronically Signed   By: Genevie Ann M.D.   On: 10/24/2017 18:59   Ct Head Code Stroke Wo Contrast  Result Date: 10/23/2017 CLINICAL DATA:  Code stroke.  Headache and right-sided facial droop EXAM: CT HEAD WITHOUT CONTRAST TECHNIQUE: Contiguous axial images were obtained from the base of the skull through the vertex without intravenous contrast. COMPARISON:  Head CT 01/15/2017 FINDINGS: Brain: No mass lesion or acute hemorrhage. No focal hypoattenuation of the basal ganglia or cortex to indicate infarcted tissue. There is periventricular hypoattenuation compatible with chronic microvascular disease. Area of hypodensity in the left insula is unchanged. Left frontal convexity meningioma measures 11 mm. Vascular: No hyperdense vessel. No advanced  atherosclerotic calcification of the arteries at the skull base. Skull: Normal visualized skull base, calvarium and extracranial soft tissues. Sinuses/Orbits: No sinus fluid levels or advanced mucosal thickening. No mastoid effusion. Normal orbits. ASPECTS [facility] Stroke Program Early CT Score) - Ganglionic level infarction (caudate, lentiform nuclei, internal capsule,  insula, M1-M3 cortex): 7 - Supraganglionic infarction (M4-M6 cortex): 3 Total score (0-10 with 10 being normal): 10 IMPRESSION: 1. No hemorrhage or mass lesion. 2. ASPECTS is 10. 3. Chronic ischemic microangiopathy. 4. 11 mm left frontal convexity meningioma, unchanged. These results were communicated to Dr. Kerney Elbe at 4:45 pm on 10/23/2017 by text page via the [facility] messaging system. Electronically Signed   By: Ulyses Jarred M.D.   On: 10/23/2017 16:45    2D ECHO: Study Conclusions  - Left ventricle: The cavity size was normal. Wall thickness was   increased in a pattern of severe LVH. There was moderate   concentric hypertrophy. Systolic function was vigorous. The   estimated ejection fraction was in the range of 65% to 70%. Wall   motion was normal; there were no regional wall motion   abnormalities. Doppler parameters are consistent with abnormal   left ventricular relaxation (grade 1 diastolic dysfunction). The   E/e&' ratio is between 8-15, suggesting indeterminate LV filling   pressure. - Aortic valve: Trileaflet. Sclerosis without stenosis. There was   trivial regurgitation. - Mitral valve: Mildly thickened leaflets . There was trivial   regurgitation. - Left atrium: The atrium was mildly dilated. - Inferior vena cava: The vessel was dilated. The respirophasic   diameter changes were blunted (< 50%), consistent with elevated   central venous pressure.   Disposition and Follow-up: Discharge Instructions    Ambulatory referral to Neurology   Complete by:  As directed    An appointment is requested in approximately: 6 weeks Follow up with stroke clinic Cecille Rubin preferred, if not available, then consider [facility] Chestnut, [facility] or Jaynee Eagles whoever is available) at [facility] in about 6-8 weeks. Thanks.   Ambulatory referral to Occupational Therapy   Complete by:  As directed    Ambulatory referral to Physical Therapy   Complete by:  As directed        DISPOSITION: home     [place]    Dennie Bible, NP. Schedule an appointment as soon as possible for a visit in 6 week(s).   Specialty:  Family Medicine Contact information: [address] [place] [address] [place] Follow up.   Specialty:  Rehabilitation Why:  They will contact you for the first visit. Contact information: [address] [place] [id number] [place] [address] [phone]       Colon Branch, MD Follow up on 10/29/2017.   Specialty:  Internal Medicine Why:  Your reestablishing care appointment is at 9:40. At this appointment you will need to schedule your hospital f/u appointment. Contact information: [place] [place] [address] [place] [address] [phone]            Time spent on Discharge: 35 mins   Signed:   Estill Cotta M.D. [facility] 10/25/2017, 1:50 PM Pager: [phone]

## 2017-10-25 NOTE — Progress Notes (Signed)
NEUROHOSPITALISTS STROKE TEAM - DAILY PROGRESS NOTE   ADMISSION HISTORY: [name] is a 72 y.o. female woman with PMHx significant for HTN and tobacco use who presents to the ED as a code stroke.  Patient went to bed 4am and when she awoke at 10am she noted right hand incoordination as well as headache and some nausea.  She then went to the hairdresser.  WHen her husband picked her up, he noticed a slight right facial droop and dysarthria.  They returned home and contacted EMS.  Code Stroke was then called and she presented to the [facility] ED where a non-contrasted head CT was negative for acute hemorrhage.  LKW: 4am tpa given?: no, outside window NIHSS: 3  SUBJECTIVE (INTERVAL HISTORY) Family is at the bedside. Patient is found laying in bed in NAD. Overall she feels her condition is unchanged. Voices no new complaints. No new events reported overnight. Patient states she was unable to complete MRI due to "horrible noises".   OBJECTIVE Lab Results: CBC:  Recent Labs  Lab 10/23/17 1630 10/23/17 1633 10/23/17 2029 10/24/17 0846  WBC 7.6  --  7.2 8.3  HGB 13.0 13.6 13.0 12.9  HCT 39.7 40.0 39.8 40.3  MCV 93.4  --  92.8 93.7  PLT 202  --  204 206   BMP: Recent Labs  Lab 10/23/17 1630 10/23/17 1633 10/23/17 2029 10/24/17 0846  NA 136 139  --  140  K 3.8 3.8  --  3.9  CL 99* 96*  --  101  CO2 27  --   --  30  GLUCOSE 102* 93  --  122*  BUN 15 18  --  12  CREATININE 0.82 0.80 0.90 0.79  CALCIUM 9.2  --   --  9.3   Liver Function Tests:  Recent Labs  Lab 10/23/17 1630  AST 21  ALT 19  ALKPHOS 65  BILITOT 0.4  PROT 6.5  ALBUMIN 3.5   Coagulation Studies:  Recent Labs    10/23/17 1630  APTT 34  INR 1.01   Urinalysis:  Recent Labs  Lab 10/23/17 1822  COLORURINE STRAW*  APPEARANCEUR CLEAR  LABSPEC 1.004*  PHURINE 7.0  GLUCOSEU NEGATIVE  HGBUR SMALL*  BILIRUBINUR NEGATIVE  KETONESUR NEGATIVE  PROTEINUR  NEGATIVE  NITRITE NEGATIVE  LEUKOCYTESUR NEGATIVE   Urine Drug Screen:     Component Value Date/Time   LABOPIA NONE DETECTED 10/23/2017 1822   COCAINSCRNUR NONE DETECTED 10/23/2017 1822   LABBENZ NONE DETECTED 10/23/2017 1822   AMPHETMU NONE DETECTED 10/23/2017 1822   THCU NONE DETECTED 10/23/2017 1822   LABBARB NONE DETECTED 10/23/2017 1822    Alcohol Level:  Recent Labs  Lab 10/23/17 1630  ETH <10   PHYSICAL EXAM Temp:  [97.8 F (36.6 C)-98.5 F (36.9 C)] 97.8 F (36.6 C) (02/14 1337) Pulse Rate:  [52-66] 59 (02/14 1337) Resp:  [18-19] 18 (02/14 1337) BP: (137-186)/(51-86) 147/57 (02/14 1337) SpO2:  [96 %-100 %] 99 % (02/14 1337) General - Well nourished, well developed, in no apparent distress HEENT-  Normocephalic,  Cardiovascular - Regular rate and rhythm  Respiratory - Lungs clear bilaterally. No wheezing. Abdomen - soft and non-tender, BS normal Extremities- no edema or cyanosis Mental Status: Patient is awake and alert. Fully oriented. She is able to follow 3-step commands. Speech is fluent. Naming intact. She has mildly dysarthric speech. Cranial Nerves: II: Visual Fields are full without deficit. Pupils are equal, round, and reactive to light.  III,IV, VI: Extra  ocular muscles are intact, no extinction DSS V: Facial sensation is symmetric to temperature VII: There is a mild right facial droop.  VIII: hearing is intact to voice X: Palette elevates symmetrically. XII: tongue is midline   Motor: Strength is equal 5/5 in the upper and lower extremities.  There is no pronator drift Sensory: Sensation is symmetric to light touch and temperature in the legs without extinction.  Sensation is symmetric to light touch and temperature in the arms Deep Tendon Reflexes: 1+ and symmetric in the biceps, achilles, and patellae.  Plantars: Toes are downgoing bilaterally.  Cerebellar: Finger to nose intact bilaterally Heel to shin intact on right, slightly ataxic on  the left.  IMAGING: I have personally reviewed the radiological images below and agree with the radiology interpretations. Ct Head Code Stroke Wo Contrast Result Date: 10/23/2017 IMPRESSION: 1. No hemorrhage or mass lesion. 2. ASPECTS is 10. 3. Chronic ischemic microangiopathy. 4. 11 mm left frontal convexity meningioma, unchanged. These results were communicated to Dr. Kerney Elbe at 4:45 pm on 10/23/2017 by text page via the [facility] messaging system. Electronically Signed   By: Ulyses Jarred M.D.   On: 10/23/2017 16:45   Echocardiogram:          Study Conclusions  - Left ventricle: The cavity size was normal. Wall thickness was   increased in a pattern of severe LVH. There was moderate   concentric hypertrophy. Systolic function was vigorous. The   estimated ejection fraction was in the range of 65% to 70%. Wall   motion was normal; there were no regional wall motion   abnormalities. Doppler parameters are consistent with abnormal   left ventricular relaxation (grade 1 diastolic dysfunction). The   E/e&' ratio is between 8-15, suggesting indeterminate LV filling   pressure. - Aortic valve: Trileaflet. Sclerosis without stenosis. There was   trivial regurgitation. - Mitral valve: Mildly thickened leaflets . There was trivial   regurgitation. - Left atrium: The atrium was mildly dilated. - Inferior vena cava: The vessel was dilated. The respirophasic   diameter changes were blunted (< 50%), consistent with elevated   central venous pressure.  Impressions:- Compared to a prior study in 2015, there is now moderate to   severe LVH with an increased LVEF to 65-70% and grade 1 DD.                                            CTA Head/Neck:                                                      IMPRESSION: 1. No emergent large vessel occlusion or high-grade intracranial stenosis. 2. Moderate stenosis at the origin of the left vertebral artery, which is otherwise normal. 3. Carotid  bifurcation and aortic atherosclerosis (ICD10-I70.0) without hemodynamically significant stenosis. 4.  Emphysema (ICD10-J43.9).  MRI Brain Limited:                                                    IMPRESSION: Limited brain MRI demonstrating acute lacunar infarct of the  posterior left corona radiata tracking toward the posterior left lentiform nuclei. No associated mass effect.     IMPRESSION: 72 year old female with history of bipolar disorder, CAD, hypertension, small left frontal meningioma, smoker admitted for headache, right hand clumsiness, right facial droop and slurred speech.  CT no acute abnormality.  CTA head and neck showed atherosclerosis of carotid bifurcation and siphons bilaterally.  MRI and TTE completed.  UDS negative, LDL 104 and A1c 5.8.  She is on aspirin 325 and Lipitor now for stroke prevention.  Smoking cessation education provided.  BP still on the high end, increased amlodipine dose and add losartan.  PT/OT recommend outpatient PT/OT.  Acute lacunar infarct of the  posterior left corona radiata tracking toward the posterior left lentiform nuclei.  Suspected Etiology: likely small vessel disease Resultant Symptoms: Right hand incoordination, right facial droop, and dysarthria Stroke Risk Factors: hyperlipidemia, hypertension and smoking Other Stroke Risk Factors: Advanced age, Cigarette smoker, Hx stroke, CAD, Hypothyroidism  Outstanding Stroke Work-up Studies:    Work up completed  10/25/2017 ASSESSMENT:   Neuro symptoms unchanged. MRI results reviewed with patient and family at bedside. Reinforced importance of smoking cessation and medication compliance. Continue ASA and statin. Follow up with Neurology in 6 weeks.  PLAN  10/25/2017: Continue Aspirin/ Statin Ongoing aggressive stroke risk factor management Patient counseled to be compliant with her antithrombotic medications Patient counseled on Lifestyle modifications including, Diet, Exercise, and  Stress Follow up with [facility] in 6 weeks  HX OF STROKES: Per patient Hx of 2 CVA's in the past  11 mm left frontal convexity meningioma, Outpatient Neurology follow up and surveillance   HYPERTENSION: Stable Permissive hypertension (OK if <220/120) for 24-48 hours post stroke and then gradually normalized within 5-7 days. Long term BP goal normotensive. May slowly restart home B/P medications after 48 hours Home Meds: NONE  HYPERLIPIDEMIA:    Component Value Date/Time   CHOL 175 10/24/2017 0846   TRIG 80 10/24/2017 0846   HDL 55 10/24/2017 0846   CHOLHDL 3.2 10/24/2017 0846   VLDL 16 10/24/2017 0846   LDLCALC 104 (H) 10/24/2017 0846  Home Meds:  NONE LDL  goal < 70 Started on Lipitor to 40 mg daily Continue statin at discharge  PRE- DIABETES: Lab Results  Component Value Date   HGBA1C 5.8 (H) 10/24/2017  HgbA1c goal < 7.0 Continue CBG monitoring and SSI to maintain glucose 140-180 mg/dl DM education   TOBACCO ABUSE Current smoker Smoking cessation counseling provided Nicotine patch provided  Other Active Problems: Principal Problem:   CVA (cerebral vascular accident) [facility]) Active Problems:   Essential hypertension   Tobacco abuse   Facial droop   Hyperlipidemia    Hospital day # 2 VTE prophylaxis: Heparin  Diet : Fall precautions Aspiration precautions Diet heart healthy/carb modified Room service appropriate? Yes; Fluid consistency: Thin   FAMILY UPDATES:  family at bedside  TEAM UPDATES: Rai, Vernelle Emerald, MD   Prior Home Stroke Medications:  No antithrombotic  Discharge Stroke Meds:  Please discharge patient on aspirin 325 mg daily   Disposition: 01-Home or Self Care Therapy Recs:               HOME Follow Up:  Follow-up Information    Dennie Bible, NP. Schedule an appointment as soon as possible for a visit in 6 week(s).   Specialty:  Family Medicine Contact information: [address] [place]  [address] [phone]  [facility] Follow up.   Specialty:  Rehabilitation Why:  They will contact you for the first visit. Contact information: [address] [place] [id number] [place] [address] [phone]       Colon Branch, MD Follow up on 10/29/2017.   Specialty:  Internal Medicine Why:  Your reestablishing care appointment is at 9:40. At this appointment you will need to schedule your hospital f/u appointment. Contact information: [address] [place] [address] [phone]            Assessment & plan discussed with with attending physician and they are in agreement.    Renie Ora Stroke Neurology Team 10/25/2017 1:59 PM  To contact Stroke Continuity provider, please refer to [email or web address] After hours, contact General Neurology   Attending note: I reviewed above note and agree with the assessment and plan. I have made any additions or clarifications directly to the above note. Pt was seen and examined.   72 year old female with history of bipolar disorder, CAD, hypertension, small left frontal meningioma, smoker admitted for headache, right hand clumsiness, right facial droop and slurred speech.  CT no acute abnormality.  CTA head and neck showed atherosclerosis of carotid bifurcation and siphons bilaterally.  MRI showed left CR small infarct and TTE EF 65-70% with sever LVH.  UDS negative, LDL 104 and A1c 5.8.  She is on aspirin 325 and Lipitor now for stroke prevention.  Smoking cessation education provided.  BP still on the high end, increased amlodipine dose and add losartan.  PT/OT recommend outpatient PT/OT. Pt is ok to be discharged from neuro standpoint.   Neurology will sign off. Please call with questions. Pt will follow up with Cecille Rubin, NP, at [facility] in about 6 weeks. Thanks for the consult.  Rosalin Hawking, MD PhD Stroke Neurology 10/25/2017 11:03  PM

## 2017-10-29 ENCOUNTER — Ambulatory Visit: Payer: Medicare Other | Admitting: Internal Medicine

## 2017-10-29 ENCOUNTER — Telehealth: Payer: Self-pay

## 2017-10-29 DIAGNOSIS — Z0289 Encounter for other administrative examinations: Secondary | ICD-10-CM

## 2017-10-29 NOTE — Telephone Encounter (Signed)
Copied from [place]. Topic: General - Other >> Oct 29, 2017 12:49 PM Damita Dunnings, CMA wrote: Reason for CRM: Pt no showed re-establishing appt today 10/29/2017 with Dr. Larose Kells- he requests that she not be rescheduled w/ him.

## 2017-11-09 ENCOUNTER — Encounter: Payer: Self-pay | Admitting: Cardiovascular Disease

## 2017-11-09 ENCOUNTER — Ambulatory Visit (INDEPENDENT_AMBULATORY_CARE_PROVIDER_SITE_OTHER): Payer: Medicare Other | Admitting: Cardiovascular Disease

## 2017-11-09 VITALS — BP 145/72 | HR 77 | Ht 69.0 in | Wt 153.0 lb

## 2017-11-09 DIAGNOSIS — I639 Cerebral infarction, unspecified: Secondary | ICD-10-CM

## 2017-11-09 DIAGNOSIS — E7800 Pure hypercholesterolemia, unspecified: Secondary | ICD-10-CM | POA: Diagnosis not present

## 2017-11-09 NOTE — Progress Notes (Signed)
11/09/2017 [name]   August 22, 1946  [id number]  Primary Physician No primary care provider on file. Primary Cardiologist: Lorretta Harp MD [name], [place]  HPI:  [name] is a 72 y.o.  mildly overweight married Caucasian female mother of one, grandmother of 5 grandchildren referred by Dr. Servando Salina , her podiatrist,  for evaluation of critical limb ischemia. Risk factors include 60-pack-years tobacco abuse and family history of heart disease with a brother that died of a myocardial infarction at age 46. She has never had a heart attack or stroke although there is a question of "mini strokes by CT scan followed by a neurologist. She does get chest pain several days a week with radiation to her neck and upper extremity. She barely had a Myoview stress test several years ago that showed no ischemia as well as a 2-D echo that was normal as well. She's had purplish discoloration of her left first second and third toes for several years which apparently have gotten worse. Toes are cool to the touch and she does have weakness in her left leg. Since I saw her last she has continued to smoke. She was admitted on 10/23/17 with a stroke involving the right side of her body that was seen on MRI. Her CT and was negative. Her symptoms have resolved.      Current Meds  Medication Sig  . amLODipine (NORVASC) 10 MG tablet Take 1 tablet (10 mg total) by mouth daily.  Marland Kitchen aspirin EC 325 MG EC tablet Take 1 tablet (325 mg total) by mouth daily.  Marland Kitchen atorvastatin (LIPITOR) 40 MG tablet Take 1 tablet (40 mg total) by mouth at bedtime.  . bisacodyl (DULCOLAX) 5 MG EC tablet Take 5 mg by mouth daily as needed for constipation.  . clonazePAM (KLONOPIN) 2 MG tablet Take 0.5 mg by mouth at bedtime.   . DULoxetine (CYMBALTA) 30 MG capsule Take 60 mg by mouth daily.   . famotidine (PEPCID) 20 MG tablet One at bedtime  . LamoTRIgine 100 MG TBDP Take 100 mg by mouth daily.   Marland Kitchen losartan (COZAAR) 50 MG  tablet Take 1 tablet (50 mg total) by mouth daily.  . magnesium gluconate (MAGONATE) 500 MG tablet Take 1,000 mg by mouth daily.   . nitroGLYCERIN (NITROSTAT) 0.4 MG SL tablet Place 1 tablet (0.4 mg total) under the tongue every 5 (five) minutes as needed for chest pain.  . polyethylene glycol (MIRALAX / GLYCOLAX) packet Take 17 g by mouth daily.  Marland Kitchen rOPINIRole (REQUIP) 2 MG tablet TAKE 1 TABLET BY MOUTH AT BEDTIME (Patient taking differently: TAKE 2 mg TABLET BY MOUTH AT BEDTIME)  . senna (SENOKOT) 8.6 MG TABS tablet Take 1 tablet by mouth at bedtime.     No Known Allergies  Social History   Socioeconomic History  . Marital status: Married    Spouse name: Not on file  . Number of children: 1  . Years of education: Not on file  . Highest education level: Not on file  Social Needs  . Financial resource strain: Not on file  . Food insecurity - worry: Not on file  . Food insecurity - inability: Not on file  . Transportation needs - medical: Not on file  . Transportation needs - non-medical: Not on file  Occupational History  . Occupation: retired  Tobacco Use  . Smoking status: Current Every Day Smoker    Packs/day: 1.00    Years: 60.00  Pack years: 60.00    Types: Cigarettes  . Smokeless tobacco: Never Used  Substance and Sexual Activity  . Alcohol use: Yes    Alcohol/week: 0.0 oz    Comment: yes on occassion  . Drug use: No  . Sexual activity: Not on file  Other Topics Concern  . Not on file  Social History Narrative   Brother in law Mr [name] (one of my patients)   Lives w/ husband         Review of Systems: General: negative for chills, fever, night sweats or weight changes.  Cardiovascular: negative for chest pain, dyspnea on exertion, edema, orthopnea, palpitations, paroxysmal nocturnal dyspnea or shortness of breath Dermatological: negative for rash Respiratory: negative for cough or wheezing Urologic: negative for hematuria Abdominal: negative for  nausea, vomiting, diarrhea, bright red blood per rectum, melena, or hematemesis Neurologic: negative for visual changes, syncope, or dizziness All other systems reviewed and are otherwise negative except as noted above.    Blood pressure (!) 145/72, pulse 77, height 5\' 9"  (1.753 m), weight 153 lb (69.4 kg).  General appearance: alert and no distress Neck: no adenopathy, no carotid bruit, no JVD, supple, symmetrical, trachea midline and thyroid not enlarged, symmetric, no tenderness/mass/nodules Lungs: clear to auscultation bilaterally Heart: regular rate and rhythm, S1, S2 normal, no murmur, click, rub or gallop Extremities: extremities normal, atraumatic, no cyanosis or edema Pulses: 2+ and symmetric Skin: Skin color, texture, turgor normal. No rashes or lesions Neurologic: Alert and oriented X 3, normal strength and tone. Normal symmetric reflexes. Normal coordination and gait  EKG not performed today  ASSESSMENT AND PLAN:   CVA (cerebral vascular accident) [facility]) Ms. Fontanilla was recently admitted 10/23/17 for 2 days with a stroke. This was seen on MRI with a acute lacunar infarct in the posterior left corona radiata. Her symptoms were of right-sided weakness and slurred speech which have resolved. Her 2-D echo was normal. CT antrum showed no significant obstructive disease. She is on low-dose aspirin. I am going to get a 30 day event monitor to further evaluate.  Hyperlipidemia History of hyperlipidemia with lipid profile performed 10/24/17 revealing total cholesterol 175, LDL 104 and HDL of 55 on atorvastatin.      Lorretta Harp MD FACP,FACC,FAHA, [facility] 11/09/2017 12:41 PM

## 2017-11-09 NOTE — Assessment & Plan Note (Signed)
History of hyperlipidemia with lipid profile performed 10/24/17 revealing total cholesterol 175, LDL 104 and HDL of 55 on atorvastatin.

## 2017-11-09 NOTE — Assessment & Plan Note (Signed)
[name] was recently admitted 10/23/17 for 2 days with a stroke. This was seen on MRI with a acute lacunar infarct in the posterior left corona radiata. Her symptoms were of right-sided weakness and slurred speech which have resolved. Her 2-D echo was normal. CT antrum showed no significant obstructive disease. She is on low-dose aspirin. I am going to get a 30 day event monitor to further evaluate.

## 2017-11-09 NOTE — Patient Instructions (Addendum)
Medication Instructions: Your physician recommends that you continue on your current medications as directed. Please refer to the Current Medication list given to you today.   Testing/Procedures: Your physician has recommended that you wear a 30 day event monitor. Event monitors are medical devices that record the heart's electrical activity. Doctors most often us these monitors to diagnose arrhythmias. Arrhythmias are problems with the speed or rhythm of the heartbeat. The monitor is a small, portable device. You can wear one while you do your normal daily activities. This is usually used to diagnose what is causing palpitations/syncope (passing out).  Follow-Up: Your physician wants you to follow-up in: 1 year with Dr. Berry. You will receive a reminder letter in the mail two months in advance. If you don't receive a letter, please call our office to schedule the follow-up appointment.  If you need a refill on your cardiac medications before your next appointment, please call your pharmacy.  

## 2017-11-15 ENCOUNTER — Ambulatory Visit (INDEPENDENT_AMBULATORY_CARE_PROVIDER_SITE_OTHER): Payer: Medicare Other | Admitting: Gastroenterology

## 2017-11-15 ENCOUNTER — Encounter: Payer: Self-pay | Admitting: Gastroenterology

## 2017-11-15 VITALS — BP 144/80 | HR 68 | Ht 67.0 in | Wt 153.0 lb

## 2017-11-15 DIAGNOSIS — K5909 Other constipation: Secondary | ICD-10-CM

## 2017-11-15 DIAGNOSIS — Z1211 Encounter for screening for malignant neoplasm of colon: Secondary | ICD-10-CM

## 2017-11-15 DIAGNOSIS — Z8673 Personal history of transient ischemic attack (TIA), and cerebral infarction without residual deficits: Secondary | ICD-10-CM | POA: Diagnosis not present

## 2017-11-15 DIAGNOSIS — I639 Cerebral infarction, unspecified: Secondary | ICD-10-CM

## 2017-11-15 MED ORDER — LINACLOTIDE 145 MCG PO CAPS: ORAL_CAPSULE | ORAL | 0 refills | Status: DC

## 2017-11-15 NOTE — Progress Notes (Signed)
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  She has been followed previously by Dr. Maurene Capes but has not been seen since 2014.  She had an attempt at colonoscopy in May 2009 which was not successful. A barium enema at that time showed that barium could not be refluxed beyond the sigmoid colon. A virtual colonoscopy in February 2011 showed a severely tortuous colon.  She had a surgical consultation by Dr. Zella Richer in 2011 for consideration of partial colectomy, as well as by a surgeon in [place] at that time, but she did not have this done.   She reports ongoing issues with constipation.  She uses a variety of laxatives on a daily basis including senna, magnesium supplements, MiraLAX, Dulcolax as needed.  She takes magnesium citrate as needed for worsening constipation.  She has used bowel preps in the past when she needs to clean herself up more thoroughly.  She states her bowels have not responded as well as have used to over the past 3 months.  She denies any blood in her stools.  She does have abdominal distention and bloating which bothers her periodically.  She usually has 1-2 bowels movements a day on this current regimen.  She denies any family history of colon cancer.  Unfortunately she was admitted in February 10/23/17 for a CVA, MRI with a acute lacunar infarct in the posterior left corona radiata.  She is requesting to transfer to a different neurology practice for follow-up for this issue.  We discussed this for a bit. She had a echocardiogram which was normal. CT angio no significant obstructive disease. Cardiology has ordered a 30 day event monitor which is pending.     Past Medical History:  Diagnosis Date  . Bipolar disorder ([place])   . CAD (coronary artery disease)    intra and extracranial vascular dx per MRI 4/11,  neurology rec strict CVRF control  . Colonic inertia   . Constipation    chronic;severe  . Depression   . Duodenitis   . EKG abnormalities    changes, stress test neg (false EKG changes)  . Gastritis   . GERD (gastroesophageal reflux disease)   . Hypertension   . Hypothyroid 01/16/2014  . Meningioma ([place]) 10/21/2013  . Psoriasis    sees derm  . Stroke [facility])      Past Surgical History:  Procedure Laterality Date  . arthroscopy  04/2010   Right knee  . TUBAL LIGATION     Family History  Problem Relation Age of Onset  . Breast cancer Mother        metastisis to bones  . Diabetes Son   . Heart disease Unknown        grandfather   . Alcohol abuse Brother   . Heart disease Maternal Aunt   . Lung cancer Brother        smoked  . Colon cancer Neg Hx    Social History   Tobacco Use  . Smoking status: Current Every Day Smoker    Packs/day: 1.00    Years: 60.00    Pack years: 60.00    Types: Cigarettes  . Smokeless tobacco: Never Used  Substance Use Topics  . Alcohol use: Yes    Alcohol/week: 0.0 oz    Comment: yes on occassion  . Drug use: No   Current Outpatient Medications  Medication Sig Dispense Refill  . amLODipine (NORVASC) 10 MG tablet Take 1 tablet (10 mg total) by mouth daily. 30 tablet 4  . aspirin EC 325 MG EC tablet Take 1 tablet (325 mg total) by mouth daily. 30 tablet 4  . atorvastatin (LIPITOR) 40 MG tablet Take 1 tablet (40 mg total) by mouth at bedtime. 30 tablet 4  . bisacodyl (DULCOLAX) 5 MG EC tablet Take 10 mg by mouth at bedtime.     . CASCARA SAGRADA PO Take 2 capsules by mouth at bedtime.    . clonazePAM (KLONOPIN) 0.5 MG tablet Take 0.5 mg by mouth at bedtime.    . DULoxetine (CYMBALTA) 30 MG capsule Take 60 mg by mouth daily.     . famotidine (PEPCID) 20 MG tablet One at bedtime 30 tablet 11  . LamoTRIgine 100 MG TBDP Take 100 mg by mouth daily.     Marland Kitchen losartan (COZAAR) 50 MG tablet Take 1 tablet (50 mg total) by mouth daily. 30 tablet 4  .  magnesium citrate SOLN Take 1 Bottle by mouth once a week.    . magnesium gluconate (MAGONATE) 500 MG tablet Take 1,000 mg by mouth daily.     . nitroGLYCERIN (NITROSTAT) 0.4 MG SL tablet Place 1 tablet (0.4 mg total) under the tongue every 5 (five) minutes as needed for chest pain. 60 tablet 12  . polyethylene glycol (MIRALAX / GLYCOLAX) packet Take 34 g by mouth every other day.     Marland Kitchen rOPINIRole (REQUIP) 2 MG tablet TAKE 1 TABLET BY MOUTH AT BEDTIME (Patient taking differently: TAKE 2 mg TABLET BY MOUTH AT BEDTIME) 30 tablet 0  . senna (SENOKOT) 8.6 MG TABS tablet Take 2 tablets by mouth at bedtime.      No current facility-administered medications for this visit.    No Known Allergies   Review of Systems: All systems reviewed and negative except where noted in HPI.    Ct Angio Head W Or Wo Contrast  Result Date: 10/24/2017 CLINICAL DATA:  Stroke.  Right-sided weakness. EXAM: CT ANGIOGRAPHY HEAD AND NECK TECHNIQUE: Multidetector CT imaging of the head and neck was performed using the standard protocol during bolus administration of intravenous contrast. Multiplanar CT image reconstructions and MIPs were obtained to evaluate the vascular anatomy. Carotid stenosis measurements (when applicable) are obtained utilizing NASCET criteria, using the distal internal carotid diameter as the denominator. CONTRAST:  20mL ISOVUE-370 IOPAMIDOL (ISOVUE-370) INJECTION 76% COMPARISON:  Head CT 10/23/2017 FINDINGS: CTA NECK FINDINGS Aortic arch: There is moderate calcific atherosclerosis of the aortic arch. There is no aneurysm, dissection or hemodynamically significant stenosis of the visualized ascending aorta and aortic arch. Conventional 3 vessel aortic branching pattern. The visualized proximal subclavian arteries are normal. Right carotid system: The right common carotid origin is widely patent. There is no common carotid or internal carotid artery dissection or aneurysm. Atherosclerotic calcification at the  carotid bifurcation without hemodynamically significant stenosis. Left carotid system: The left common carotid origin is widely patent. There is no common carotid or internal carotid artery dissection or aneurysm. Atherosclerotic calcification at the carotid bifurcation without hemodynamically significant stenosis. Vertebral arteries: The vertebral system is left dominant. There is moderate narrowing of the origin of the left vertebral artery. The right vertebral artery origin is normal. Both vertebral arteries are normal to their confluence with the basilar artery. Skeleton: There is no bony spinal canal stenosis. No lytic or blastic lesions. Other neck: The nasopharynx is clear. The oropharynx and hypopharynx are normal.  The epiglottis is normal. The supraglottic larynx, glottis and subglottic larynx are normal. No retropharyngeal collection. The parapharyngeal spaces are preserved. The parotid and submandibular glands are normal. No sialolithiasis or salivary ductal dilatation. The thyroid gland is normal. There is no cervical lymphadenopathy. Upper chest: Biapical emphysema. Review of the MIP images confirms the above findings CTA HEAD FINDINGS Anterior circulation: --Intracranial internal carotid arteries: Normal. --Anterior cerebral arteries: Normal. --Middle cerebral arteries: Normal. --Posterior communicating arteries: Present on the right, absent on the left. Posterior circulation: --Posterior cerebral arteries: Normal. --Superior cerebellar arteries: Normal. --Basilar artery: Normal. --Anterior inferior cerebellar arteries: Normal. --Posterior inferior cerebellar arteries: Normal. Venous sinuses: As permitted by contrast timing, patent. Anatomic variants: None Delayed phase: Superior left frontal convexity meningioma measures 12 mm. There is periventricular hypoattenuation compatible with chronic microvascular disease. No parenchymal contrast enhancement. Review of the MIP images confirms the above  findings. IMPRESSION: 1. No emergent large vessel occlusion or high-grade intracranial stenosis. 2. Moderate stenosis at the origin of the left vertebral artery, which is otherwise normal. 3. Carotid bifurcation and aortic atherosclerosis (ICD10-I70.0) without hemodynamically significant stenosis. 4.  Emphysema (ICD10-J43.9). Electronically Signed   By: Ulyses Jarred M.D.   On: 10/24/2017 17:36   Dg Chest 2 View  Result Date: 10/24/2017 CLINICAL DATA:  CVA (cerebral vascular accident). Pt had a CVA yesterday morning. Hx of CAD, EKG abnormalities, HTN, hypothyroid, current every day smoker 1 pack/day 60 years. EXAM: CHEST  2 VIEW COMPARISON:  CT 01/20/2016 FINDINGS: Normal cardiac silhouette. There is chronic bronchitic markings. Emphysematous upper lobes. Degenerative osteophytosis of the spine. IMPRESSION: Mild chronic interstitial lung disease.  No acute findings. Electronically Signed   By: Suzy Bouchard M.D.   On: 10/24/2017 17:48   Ct Angio Neck W Or Wo Contrast  Result Date: 10/24/2017 CLINICAL DATA:  Stroke.  Right-sided weakness. EXAM: CT ANGIOGRAPHY HEAD AND NECK TECHNIQUE: Multidetector CT imaging of the head and neck was performed using the standard protocol during bolus administration of intravenous contrast. Multiplanar CT image reconstructions and MIPs were obtained to evaluate the vascular anatomy. Carotid stenosis measurements (when applicable) are obtained utilizing NASCET criteria, using the distal internal carotid diameter as the denominator. CONTRAST:  89mL ISOVUE-370 IOPAMIDOL (ISOVUE-370) INJECTION 76% COMPARISON:  Head CT 10/23/2017 FINDINGS: CTA NECK FINDINGS Aortic arch: There is moderate calcific atherosclerosis of the aortic arch. There is no aneurysm, dissection or hemodynamically significant stenosis of the visualized ascending aorta and aortic arch. Conventional 3 vessel aortic branching pattern. The visualized proximal subclavian arteries are normal. Right carotid system:  The right common carotid origin is widely patent. There is no common carotid or internal carotid artery dissection or aneurysm. Atherosclerotic calcification at the carotid bifurcation without hemodynamically significant stenosis. Left carotid system: The left common carotid origin is widely patent. There is no common carotid or internal carotid artery dissection or aneurysm. Atherosclerotic calcification at the carotid bifurcation without hemodynamically significant stenosis. Vertebral arteries: The vertebral system is left dominant. There is moderate narrowing of the origin of the left vertebral artery. The right vertebral artery origin is normal. Both vertebral arteries are normal to their confluence with the basilar artery. Skeleton: There is no bony spinal canal stenosis. No lytic or blastic lesions. Other neck: The nasopharynx is clear. The oropharynx and hypopharynx are normal. The epiglottis is normal. The supraglottic larynx, glottis and subglottic larynx are normal. No retropharyngeal collection. The parapharyngeal spaces are preserved. The parotid and submandibular glands are normal. No sialolithiasis or salivary ductal dilatation. The thyroid  gland is normal. There is no cervical lymphadenopathy. Upper chest: Biapical emphysema. Review of the MIP images confirms the above findings CTA HEAD FINDINGS Anterior circulation: --Intracranial internal carotid arteries: Normal. --Anterior cerebral arteries: Normal. --Middle cerebral arteries: Normal. --Posterior communicating arteries: Present on the right, absent on the left. Posterior circulation: --Posterior cerebral arteries: Normal. --Superior cerebellar arteries: Normal. --Basilar artery: Normal. --Anterior inferior cerebellar arteries: Normal. --Posterior inferior cerebellar arteries: Normal. Venous sinuses: As permitted by contrast timing, patent. Anatomic variants: None Delayed phase: Superior left frontal convexity meningioma measures 12 mm. There is  periventricular hypoattenuation compatible with chronic microvascular disease. No parenchymal contrast enhancement. Review of the MIP images confirms the above findings. IMPRESSION: 1. No emergent large vessel occlusion or high-grade intracranial stenosis. 2. Moderate stenosis at the origin of the left vertebral artery, which is otherwise normal. 3. Carotid bifurcation and aortic atherosclerosis (ICD10-I70.0) without hemodynamically significant stenosis. 4.  Emphysema (ICD10-J43.9). Electronically Signed   By: Ulyses Jarred M.D.   On: 10/24/2017 17:36   Mr Brain Limited Wo Contrast  Result Date: 10/24/2017 CLINICAL DATA:  72 year old female with code stroke presentation, right side weakness. CTA today negative for emergent large vessel occlusion. EXAM: LIMITED MRI HEAD WITHOUT CONTRAST TECHNIQUE: By request of Neurology diffusion-weighted imaging of the brain only, without intravenous contrast. COMPARISON:  CTA 1712 hr today and earlier.  Brain MRI 10/03/2015. FINDINGS: Small 10-12 millimeter focus of restricted diffusion tracking from the posterior right corona radiata toward the posterior lentiform nuclei (series 3, image 27 and series 4, image 18). No other restricted diffusion. No midline shift or intracranial mass effect. Stable cerebral volume. No ventriculomegaly. IMPRESSION: Limited brain MRI demonstrating acute lacunar infarct of the posterior left corona radiata tracking toward the posterior left lentiform nuclei. No associated mass effect. Electronically Signed   By: Genevie Ann M.D.   On: 10/24/2017 18:59   Ct Head Code Stroke Wo Contrast  Result Date: 10/23/2017 CLINICAL DATA:  Code stroke.  Headache and right-sided facial droop EXAM: CT HEAD WITHOUT CONTRAST TECHNIQUE: Contiguous axial images were obtained from the base of the skull through the vertex without intravenous contrast. COMPARISON:  Head CT 01/15/2017 FINDINGS: Brain: No mass lesion or acute hemorrhage. No focal hypoattenuation of the  basal ganglia or cortex to indicate infarcted tissue. There is periventricular hypoattenuation compatible with chronic microvascular disease. Area of hypodensity in the left insula is unchanged. Left frontal convexity meningioma measures 11 mm. Vascular: No hyperdense vessel. No advanced atherosclerotic calcification of the arteries at the skull base. Skull: Normal visualized skull base, calvarium and extracranial soft tissues. Sinuses/Orbits: No sinus fluid levels or advanced mucosal thickening. No mastoid effusion. Normal orbits. ASPECTS [facility] Stroke Program Early CT Score) - Ganglionic level infarction (caudate, lentiform nuclei, internal capsule, insula, M1-M3 cortex): 7 - Supraganglionic infarction (M4-M6 cortex): 3 Total score (0-10 with 10 being normal): 10 IMPRESSION: 1. No hemorrhage or mass lesion. 2. ASPECTS is 10. 3. Chronic ischemic microangiopathy. 4. 11 mm left frontal convexity meningioma, unchanged. These results were communicated to Dr. Kerney Elbe at 4:45 pm on 10/23/2017 by text page via the [facility] messaging system. Electronically Signed   By: Ulyses Jarred M.D.   On: 10/23/2017 16:45    Lab Results  Component Value Date   WBC 8.3 10/24/2017   HGB 12.9 10/24/2017   HCT 40.3 10/24/2017   MCV 93.7 10/24/2017   PLT 206 10/24/2017    Lab Results  Component Value Date   CREATININE 0.79 10/24/2017   BUN 12  10/24/2017   NA 140 10/24/2017   K 3.9 10/24/2017   CL 101 10/24/2017   CO2 30 10/24/2017    Lab Results  Component Value Date   ALT 19 10/23/2017   AST 21 10/23/2017   ALKPHOS 65 10/23/2017   BILITOT 0.4 10/23/2017      Physical Exam: BP (!) 144/80 (BP Location: Left Arm, Patient Position: Sitting, Cuff Size: Normal)   Pulse 68   Ht 5\' 7"  (1.702 m) Comment: height measured without shoes  Wt 153 lb (69.4 kg)   BMI 23.96 kg/m  Constitutional: Pleasant,well-developed, female in no acute distress. HEENT: Normocephalic and atraumatic. Conjunctivae are normal. No  scleral icterus. Neck supple.  Cardiovascular: Normal rate, regular rhythm.  Pulmonary/chest: Effort normal and breath sounds normal. No wheezing, rales or rhonchi. Abdominal: Soft, nondistended, nontender. There are no masses palpable. No hepatomegaly. Extremities: no edema Lymphadenopathy: No cervical adenopathy noted. Neurological: Alert and oriented to person place and time. Skin: Skin is warm and dry. No rashes noted. Psychiatric: Normal mood and affect. Behavior is normal.   ASSESSMENT AND PLAN: 72 year old female with a history of a recent stroke, here to establish care for chronic constipation in the setting of colonic inertia /redundant colon.   History as outlined above, prior attempt at colonoscopy has not been successful.  Her last screening was via virtual colonoscopy in 2011.  She has chronic constipation managed with a variety of laxatives.  We discussed options at this time.  She can increase her MiraLAX use if needed, she has done this in the past however does not like taking too much of it.  She has not been tried on Amitiza, Linzess or Trulance in the past.  I offered her a trial of Linzess to see if this helps. Will dose at 145 mcg once to twice daily, sample provided.  If she wishes to have a prescription of this based on her trial she will contact us. She has had surgical evaluations in the past but did not proceed with this.  We otherwise discussed whether or not she wants any further colon cancer screening.  We discussed options to include another trial of optical colonoscopy, virtual colonoscopy, stool based testing, versus no further screening. We discussed the timing of this in relation to her recent stroke.  I would not want to put her through a colonoscopy in the near future.  She will need to follow-up with neurology for this issue and finish her cardiology evaluation prior to any invasive testing.  She was not comfortable with not having any further screening.  I  think a repeat virtual colonoscopy may be reasonable option for her - will localize any significant polyps to see if amenable to endoscopic resection. After this discussion she did want to proceed with virtual colonoscopy in the upcoming weeks, and can discuss plan based on that result.   She otherwise asked for referral to [facility] neurology for follow-up management of her stroke.  Referral was placed.  [place] Cellar, MD [facility] Pager [phone]  CC: Colon Branch, MD

## 2017-11-15 NOTE — Patient Instructions (Addendum)
If you are age 74 or older, your body mass index should be between 23-30. Your Body mass index is 23.96 kg/m. If this is out of the aforementioned range listed, please consider follow up with your Primary Care Provider.  If you are age 42 or younger, your body mass index should be between 19-25. Your Body mass index is 23.96 kg/m. If this is out of the aformentioned range listed, please consider follow up with your Primary Care Provider.   You have been scheduled for a Virtual Colonoscopy on Friday,  11-30-17 at 9:00am. Please arrive at 8:45am at [facility].  They are located at [address]. You will need to go by their office 4 days before your procedure to pick up contrast, on or before March 15th. Please bring your insurance card.  We have given you samples of the following medication to take: Linzess 128mcg: Take once to twice a day  We will refer you to [facility].  They will contact you to schedule an appointment.  Please let us know if you have not heard from them within a week or two.   MIRALAX  BOWEL PREP: Mix 238 g of Miralax with 64 ounces of Gatorade when needed.  Thank you for entrusting me with your care and for choosing [facility], Dr. [place] Cellar

## 2017-11-16 ENCOUNTER — Telehealth: Payer: Self-pay | Admitting: Gastroenterology

## 2017-11-16 NOTE — Telephone Encounter (Signed)
Left detailed message for pt at number provided.  Also mailed her another copy of her AVS with instructions.   You have been scheduled for a Virtual Colonoscopy on Friday, 11-30-17 at 9:00am. Please arrive at 8:45am at [facility].  They are located at [address]. You will need to go by their office 4 days before your procedure to pick up contrast, on or before March 15th. Please bring your insurance card.

## 2017-11-16 NOTE — Telephone Encounter (Signed)
Patient states she lost her instructions for her virtual colon on 3.22.19 and would like a call to discuss.

## 2017-11-22 ENCOUNTER — Other Ambulatory Visit: Payer: Self-pay | Admitting: Cardiovascular Disease

## 2017-11-22 DIAGNOSIS — I639 Cerebral infarction, unspecified: Secondary | ICD-10-CM

## 2017-11-22 DIAGNOSIS — I4891 Unspecified atrial fibrillation: Secondary | ICD-10-CM

## 2017-11-27 ENCOUNTER — Telehealth: Payer: Self-pay | Admitting: Gastroenterology

## 2017-11-27 DIAGNOSIS — I63312 Cerebral infarction due to thrombosis of left middle cerebral artery: Secondary | ICD-10-CM

## 2017-11-27 MED ORDER — LINACLOTIDE 145 MCG PO CAPS: ORAL_CAPSULE | ORAL | 3 refills | Status: DC

## 2017-11-27 NOTE — Telephone Encounter (Signed)
Pt states that Linzes is working well and would like a prescription sent to [facility] on [address].

## 2017-11-27 NOTE — Telephone Encounter (Signed)
[name] for pt that I sent her Rx for Linzess to [facility].  Also I inadvertently sent her referral intended for [facility] to the wrong Neuro office ([facility]). LM that Dr. Loni Muse wanted her to see [facility] and I have corrected my mistake and sent referral.  She should expect to hear from them to schedule an appt.[name]

## 2017-11-27 NOTE — Telephone Encounter (Signed)
[facility] returning Jan's call to update her on pt referral. They state that referral was sent to [facility] instead of [facility] and per referral in Epic it looks like they have tried to contact patient several times.

## 2017-11-28 ENCOUNTER — Other Ambulatory Visit: Payer: Self-pay

## 2017-11-28 MED ORDER — LINACLOTIDE 145 MCG PO CAPS: 145.0000 ug | ORAL_CAPSULE | Freq: Every day | ORAL | 3 refills | Status: DC

## 2017-11-28 NOTE — Telephone Encounter (Signed)
Rec'd fax from [employer] that insurance will not cover Linzess as "take one to two a day" with quantity of 45.  Resent script as "take one a day" #30 to see if it will be covered.  Left detailed message for pt.

## 2017-11-29 ENCOUNTER — Telehealth: Payer: Self-pay | Admitting: Gastroenterology

## 2017-11-29 NOTE — Telephone Encounter (Signed)
Called and spoke to pharmacy. Linzess 131mcg approved at #30, once daily.   Called and LM for pt that she could pick up script tomorrow.  We have samples if she needs them

## 2017-11-29 NOTE — Telephone Encounter (Signed)
Pt states that Linzess needs PA, she only has 2 pills left and wants to know if she can have more samples until PA goes through.

## 2017-11-30 ENCOUNTER — Inpatient Hospital Stay: Admission: RE | Admit: 2017-11-30 | Payer: Medicare Other | Source: Ambulatory Visit

## 2017-11-30 NOTE — Telephone Encounter (Signed)
Spoke to patient and let her know Linzess should be at the pharmacy. She stated they closed early. I gave her one week worth of samples and will have her contact her pharmacy on Monday to pick her her prescription.

## 2017-11-30 NOTE — Telephone Encounter (Signed)
Patient is requesting samples of Linzess.

## 2017-12-25 ENCOUNTER — Ambulatory Visit (INDEPENDENT_AMBULATORY_CARE_PROVIDER_SITE_OTHER): Payer: Medicare Other | Admitting: Neurology

## 2017-12-25 ENCOUNTER — Encounter: Payer: Self-pay | Admitting: Neurology

## 2017-12-25 VITALS — BP 122/73 | HR 64 | Ht 67.0 in | Wt 155.0 lb

## 2017-12-25 DIAGNOSIS — I63312 Cerebral infarction due to thrombosis of left middle cerebral artery: Secondary | ICD-10-CM | POA: Diagnosis not present

## 2017-12-25 DIAGNOSIS — F17200 Nicotine dependence, unspecified, uncomplicated: Secondary | ICD-10-CM | POA: Diagnosis not present

## 2017-12-25 DIAGNOSIS — G8929 Other chronic pain: Secondary | ICD-10-CM | POA: Diagnosis not present

## 2017-12-25 DIAGNOSIS — I639 Cerebral infarction, unspecified: Secondary | ICD-10-CM

## 2017-12-25 DIAGNOSIS — E7800 Pure hypercholesterolemia, unspecified: Secondary | ICD-10-CM

## 2017-12-25 DIAGNOSIS — I10 Essential (primary) hypertension: Secondary | ICD-10-CM

## 2017-12-25 DIAGNOSIS — M5442 Lumbago with sciatica, left side: Secondary | ICD-10-CM | POA: Diagnosis not present

## 2017-12-25 NOTE — Patient Instructions (Addendum)
-   continue ASA and lipitor for stroke prevention  - check BP at home and record - quit smoking if able  - Follow up with your primary care physician for stroke risk factor modification. Recommend maintain blood pressure goal <130/80, diabetes with hemoglobin A1c goal below 7.0% and lipids with LDL cholesterol goal below 70 mg/dL.  - will refer to outpt PT/OT for back pain. - healthy diet and regular exercise if tolerable  - follow up in 4 months with [place].

## 2017-12-25 NOTE — Progress Notes (Signed)
STROKE NEUROLOGY FOLLOW UP NOTE  NAME: [name] DOB: 1946-08-19  REASON FOR VISIT: stroke follow up HISTORY FROM: Patient and chart  Today we had the pleasure of seeing [name] in follow-up at our Neurology Clinic. Pt was accompanied by no one.   History Summary 72 year old female with history of bipolar disorder, CAD, hypertension, small left frontal meningioma, smoker admitted on 10/23/17 for headache, right hand clumsiness, right facial droop and slurred speech. CT no acute abnormality. CTA head and neck showed atherosclerosis of carotid bifurcation and siphons bilaterally. MRI showed left CR small infarct and TTE EF 65-70% with sever LVH.UDS negative, LDL 104 and A1c 5.8. Put on aspirin 325 and Lipitor for stroke prevention. Smoking cessation education provided. PT/OT recommend outpatient PT/OT.   Interval History During the interval time, the patient has been doing well.  Right hand weakness resolved.  Did not get any outpatient PT/OT.  Still smoking, plan to cut down.  However, patient main complaint was that patient had recent lower back pain, left worse than right, with left sciatica with left cuff throbbing pain.  Left lower back pain has been on and off for a long time, but recently flared up.  Still on aspirin and Lipitor.  BP today 122/73.  REVIEW OF SYSTEMS: Full 14 system review of systems performed and notable only for those listed below and in HPI above, all others are negative:  Constitutional: Fatigue Cardiovascular:  Ear/Nose/Throat:   Skin:  Eyes: Blurry vision Respiratory:   Gastroitestinal: Constipation Genitourinary:  Hematology/Lymphatic: Easy bruising Endocrine:  Musculoskeletal: Joint pain Allergy/Immunology:   Neurological: Memory loss, headache, weakness, dizziness, tremor Psychiatric: Depression, anxiety, not enough sleep, decreased energy, disinterest in activities Sleep: Restless legs  The following represents the patient's updated  allergies and side effects list: No Known Allergies  The neurologically relevant items on the patient's problem list were reviewed on today's visit.  Neurologic Examination  A problem focused neurological exam (12 or more points of the single system neurologic examination, vital signs counts as 1 point, cranial nerves count for 8 points) was performed.  Blood pressure 122/73, pulse 64, height 5\' 7"  (1.702 m), weight 155 lb (70.3 kg).  General - Well nourished, well developed, in no apparent distress.  Ophthalmologic - Sharp disc margins OU.  Cardiovascular - Regular rate and rhythm.  Mental Status -  Level of arousal and orientation to time, place, and person were intact. Language including expression, naming, repetition, comprehension was assessed and found intact. Fund of Knowledge was assessed and was intact.  Cranial Nerves II - XII - II - Visual field intact OU. III, IV, VI - Extraocular movements intact. V - Facial sensation intact bilaterally. VII - Facial movement intact bilaterally. VIII - Hearing & vestibular intact bilaterally. X - Palate elevates symmetrically. XI - Chin turning & shoulder shrug intact bilaterally. XII - Tongue protrusion intact.  Motor Strength - The patient's strength was normal in all extremities and pronator drift was absent.  Bulk was normal and fasciculations were absent.   Motor Tone - Muscle tone was assessed at the neck and appendages and was normal.  Reflexes - The patient's reflexes were 1+ in all extremities and she had no pathological reflexes.  Sensory - Light touch, temperature/pinprick were assessed and were normal.    Coordination - The patient had normal movements in the hands with no ataxia or dysmetria.  Tremor was absent.  Gait and Station - The patient's transfers, posture, gait, station, and turns  were observed as normal.   Functional score  mRS = 1   0 - No symptoms.   1 - No significant disability. Able to carry  out all usual activities, despite some symptoms.   2 - Slight disability. Able to look after own affairs without assistance, but unable to carry out all previous activities.   3 - Moderate disability. Requires some help, but able to walk unassisted.   4 - Moderately severe disability. Unable to attend to own bodily needs without assistance, and unable to walk unassisted.   5 - Severe disability. Requires constant nursing care and attention, bedridden, incontinent.   6 - Dead.   NIH Stroke Scale = 0  Data reviewed: I personally reviewed the images and agree with the radiology interpretations.  Ct Head Code Stroke Wo Contrast Result Date: 10/23/2017 IMPRESSION: 1. No hemorrhage or mass lesion. 2. ASPECTS is 10. 3. Chronic ischemic microangiopathy. 4. 11 mm left frontal convexity meningioma, unchanged. These results were communicated to Dr. Kerney Elbe at 4:45 pm on 10/23/2017 by text page via the [facility] messaging system. Electronically Signed   By: Ulyses Jarred M.D.   On: 10/23/2017 16:45   Echocardiogram:          Study Conclusions  - Left ventricle: The cavity size was normal. Wall thickness was increased in a pattern of severe LVH. There was moderate concentric hypertrophy. Systolic function was vigorous. The estimated ejection fraction was in the range of 65% to 70%. Wall motion was normal; there were no regional wall motion abnormalities. Doppler parameters are consistent with abnormal left ventricular relaxation (grade 1 diastolic dysfunction). The E/e&' ratio is between 8-15, suggesting indeterminate LV filling pressure. - Aortic valve: Trileaflet. Sclerosis without stenosis. There was trivial regurgitation. - Mitral valve: Mildly thickened leaflets . There was trivial regurgitation. - Left atrium: The atrium was mildly dilated. - Inferior vena cava: The vessel was dilated. The respirophasic diameter changes were blunted (<50%), consistent with  elevated central venous pressure.  Impressions:- Compared to a prior study in 2015, there is now moderate to severe LVH with an increased LVEF to 65-70% and grade 1 DD.                                            CTA Head/Neck:                                                      IMPRESSION: 1. No emergent large vessel occlusion or high-grade intracranial stenosis. 2. Moderate stenosis at the origin of the left vertebral artery, which is otherwise normal. 3. Carotid bifurcation and aortic atherosclerosis (ICD10-I70.0) without hemodynamically significant stenosis. 4. Emphysema (ICD10-J43.9).  MRI Brain Limited:                                                    IMPRESSION: Limited brain MRI demonstrating acute lacunar infarct of the posterior left corona radiata tracking toward the posterior left lentiform nuclei. No associated mass effect.  Component     Latest Ref Rng & Units 10/24/2017  Cholesterol  0 - 200 mg/dL 175  Triglycerides     <150 mg/dL 80  HDL Cholesterol     >40 mg/dL 55  Total CHOL/HDL Ratio     RATIO 3.2  VLDL     0 - 40 mg/dL 16  LDL (calc)     0 - 99 mg/dL 104 (H)  Hemoglobin A1C     4.8 - 5.6 % 5.8 (H)  Mean Plasma Glucose     mg/dL 119.76    Assessment: As you may recall, she is a 72 y.o. Caucasian female with PMH of bipolar disorder, CAD, hypertension, small left frontal meningioma, smoker admitted on 10/23/17 for left CR small infarct. CTA head and neck showed atherosclerosis of carotid bifurcation and siphons bilaterally. TTE EF 65-70% with sever LVH.UDS negative, LDL 104 and A1c 5.8.  Still most likely small vessel disease.  Put on aspirin 325 and Lipitor for stroke prevention. Smoking cessation education provided. now right hand weakness resolved. Still smoking, plan to cut down.  However, patient main complaint was that patient had recent lower back pain with left sciatica.   Plan:  - continue ASA and lipitor for stroke prevention  -  check BP at home and record - quit smoking if able  - Follow up with your primary care physician for stroke risk factor modification. Recommend maintain blood pressure goal <130/80, diabetes with hemoglobin A1c goal below 7.0% and lipids with LDL cholesterol goal below 70 mg/dL.  - will refer to outpt PT/OT for back pain. - healthy diet and regular exercise if tolerable  - follow up in 4 months with [place].   I spent more than 25 minutes of face to face time with the patient. Greater than 50% of time was spent in counseling and coordination of care. We discussed stroke risk factor modification, lower back pain management, PT referral.   Orders Placed This Encounter  Procedures  . Ambulatory referral to Physical Therapy    Referral Priority:   Routine    Referral Type:   Physical Medicine    Referral Reason:   Specialty Services Required    Requested Specialty:   Physical Therapy    Number of Visits Requested:   1    No orders of the defined types were placed in this encounter.   Patient Instructions  - continue ASA and lipitor for stroke prevention  - check BP at home and record - quit smoking if able  - Follow up with your primary care physician for stroke risk factor modification. Recommend maintain blood pressure goal <130/80, diabetes with hemoglobin A1c goal below 7.0% and lipids with LDL cholesterol goal below 70 mg/dL.  - will refer to outpt PT/OT for back pain. - healthy diet and regular exercise if tolerable  - follow up in 4 months with [place].    Rosalin Hawking, MD PhD [facility] [address], [place], [place] [address] [phone]

## 2018-01-22 DIAGNOSIS — F331 Major depressive disorder, recurrent, moderate: Secondary | ICD-10-CM | POA: Diagnosis not present

## 2018-02-12 ENCOUNTER — Ambulatory Visit: Payer: Medicare Other | Admitting: Family Medicine

## 2018-02-14 ENCOUNTER — Ambulatory Visit: Payer: Medicare Other | Admitting: Family Medicine

## 2018-02-14 DIAGNOSIS — F331 Major depressive disorder, recurrent, moderate: Secondary | ICD-10-CM | POA: Diagnosis not present

## 2018-02-19 ENCOUNTER — Encounter: Payer: Self-pay | Admitting: Family Medicine

## 2018-02-19 ENCOUNTER — Ambulatory Visit (INDEPENDENT_AMBULATORY_CARE_PROVIDER_SITE_OTHER): Payer: Medicare Other | Admitting: Family Medicine

## 2018-02-19 ENCOUNTER — Ambulatory Visit (INDEPENDENT_AMBULATORY_CARE_PROVIDER_SITE_OTHER): Payer: Medicare Other

## 2018-02-19 VITALS — BP 150/81 | HR 76 | Temp 98.2°F | Resp 17 | Ht 67.0 in | Wt 156.0 lb

## 2018-02-19 DIAGNOSIS — M5432 Sciatica, left side: Secondary | ICD-10-CM

## 2018-02-19 DIAGNOSIS — L819 Disorder of pigmentation, unspecified: Secondary | ICD-10-CM

## 2018-02-19 DIAGNOSIS — I639 Cerebral infarction, unspecified: Secondary | ICD-10-CM | POA: Diagnosis not present

## 2018-02-19 DIAGNOSIS — M5136 Other intervertebral disc degeneration, lumbar region: Secondary | ICD-10-CM | POA: Diagnosis not present

## 2018-02-19 DIAGNOSIS — M25562 Pain in left knee: Secondary | ICD-10-CM | POA: Diagnosis not present

## 2018-02-19 NOTE — Patient Instructions (Addendum)
I suspect your low back pain and leg pain is from sciatica, but your strength and reflexes were normal today.  The discoloration of the toes was thought to be Raynaud's phenomenon before, but with those symptoms also worsening past few months we will refer you to a vascular specialist for further evaluation.  Tylenol may be safer than NSAIDs at this time. Follow-up with physical therapy as planned and see me in the next few weeks to determine any further steps.  I did refer you to orthopedist for your back pain as well.  Thank you for coming in today.    IF you received an x-ray today, you will receive an invoice from [facility]. Please contact [facility] at [phone] with questions or concerns regarding your invoice.   IF you received labwork today, you will receive an invoice from [place]. Please contact [facility] at [phone] with questions or concerns regarding your invoice.   Our billing staff will not be able to assist you with questions regarding bills from these companies.  You will be contacted with the lab results as soon as they are available. The fastest way to get your results is to activate your My Chart account. Instructions are located on the last page of this paperwork. If you have not heard from [place] regarding the results in 2 weeks, please contact this office.

## 2018-02-19 NOTE — Progress Notes (Signed)
Subjective:  By signing my name below, I, Moises Blood, attest that this documentation has been prepared under the direction and in the presence of Merri Ray, MD. Electronically Signed: Moises Blood, [place]. 02/19/2018 , 5:32 PM .  Patient was seen in Room 8 .   Patient ID: [name], female    DOB: Dec 21, 1945, 72 y.o.   MRN: [id number] Chief Complaint  Patient presents with  . Establish Care   HPI [name] is a 72 y.o. female Here to establish care. She is a previous patient of Dr. Ninfa Meeker, last seen in April 2017. She has a history of multiple medical problems per problem list.   Patient states she's been having left low back pain bothering her for about 5 months now, worsening in the past month. She notes pain in her left lower leg, believes pain caused by circulation. She denies any known injury. She hasn't been able to walk due to the pain, as worsening pain with ambulation and movement. She notes having pain when she sleeps at night too. Her pain improves with rest. She denies cramping, incontinence or saddle anesthesia. She's been taking Aleve 1 tablet qhs for her back pain. She denies history of diabetes. She denies history of stents placed in her heart. She does mention having numbness in her foot. She had an MRI of lumbar spine on 12/17/16, which showed dextroscoliosis, multiple degenerative changes, lateral recess narrowing L3-4, L4-5 related to disc bulge, trace anterolisthesis L2 on L3, moderate facet arthrosis.   She was seen by cardiologist, Dr. Gwenlyn Found, seen for critical lower limb ischemia. Doppler done March 2nd, 2018, with purplish discoloration of her left 1st, 2nd and 3rd toes. Doppler showed normal ABI's bilaterally, abnormal PPG on the left, thought Raynaud's phenomenon; recommended smoking cessation and start low dose amlodipine. Her discoloration has worsened, and is affecting the rest of her foot. She denies seeing a vascular doctor. Patient is still  currently smoking.   Patient was seen by neurologist, Dr. Erlinda Hong, with [facility] on April 16th due to prior stroke in February. She was referred to PT/OT but has not been done previously. Suspected small vessel disease for stroke, referred for PT/OT for back pain. She is still taking aspirin for stroke prevention, but notes stopped taking Lipitor.   Patient Active Problem List   Diagnosis Date Noted  . Chronic left-sided low back pain with left-sided sciatica 12/25/2017  . Facial droop   . Hyperlipidemia   . CVA (cerebral vascular accident) ([place]) 10/23/2017  . Critical lower limb ischemia 11/08/2016  . Smoker 11/08/2016  . Postinflammatory pulmonary fibrosis ([place]) 02/14/2016  . Cigarette smoker 12/24/2015  . Hypothyroid ? 01/16/2014  . Mild cognitive impairment 01/16/2014  . Meningioma ([place]) 10/21/2013  . Chest pain 10/20/2013  . Medicare annual wellness visit, subsequent 06/16/2013  . Dizziness and giddiness 06/16/2013  . RLS (restless legs syndrome) 04/29/2012  . Abdominal pain 12/27/2010  . DEGENERATIVE JOINT DISEASE 09/23/2010  . CAROTID ARTERY DISEASE 08/15/2010  . CHEST PAIN 08/15/2010  . HEADACHE 12/02/2009  . BACK PAIN 11/22/2009  . CONSTIPATION, CHRONIC 01/29/2008  . NAUSEA 01/28/2008  . DEPRESSION 03/08/2007  . Essential hypertension 03/08/2007   Past Medical History:  Diagnosis Date  . Bipolar disorder ([place])   . CAD (coronary artery disease)    intra and extracranial vascular dx per MRI 4/11, neurology rec strict CVRF control  . Colonic inertia   . Constipation    chronic;severe  . Depression   .  Duodenitis   . EKG abnormalities    changes, stress test neg (false EKG changes)  . Gastritis   . GERD (gastroesophageal reflux disease)   . Hypertension   . Hypothyroid 01/16/2014  . Meningioma ([place]) 10/21/2013  . Psoriasis    sees derm  . Stroke [facility])    Past Surgical History:  Procedure Laterality Date  . arthroscopy  04/2010   Right knee  . TUBAL  LIGATION     No Known Allergies Prior to Admission medications   Medication Sig Start Date End Date Taking? Authorizing Provider  amLODipine (NORVASC) 10 MG tablet Take 1 tablet (10 mg total) by mouth daily. 10/25/17   Rai, Vernelle Emerald, MD  aspirin EC 325 MG EC tablet Take 1 tablet (325 mg total) by mouth daily. 10/25/17   Rai, Vernelle Emerald, MD  atorvastatin (LIPITOR) 40 MG tablet Take 1 tablet (40 mg total) by mouth at bedtime. 10/25/17   Rai, Ripudeep K, MD  bisacodyl (DULCOLAX) 5 MG EC tablet Take 10 mg by mouth at bedtime.     [provider]  CASCARA SAGRADA PO Take 2 capsules by mouth at bedtime.    [provider]  clonazePAM (KLONOPIN) 0.5 MG tablet Take 0.5 mg by mouth at bedtime.    [provider]  DULoxetine (CYMBALTA) 30 MG capsule Take 60 mg by mouth daily.     [provider]  famotidine (PEPCID) 20 MG tablet One at bedtime 02/14/16   Tanda Rockers, MD  lamoTRIgine (LAMICTAL) 100 MG tablet Take 100 mg by mouth 2 (two) times daily. 12/18/17   [provider]  LamoTRIgine 100 MG TBDP Take 100 mg by mouth daily.     [provider]  linaclotide Rolan Lipa) 145 MCG CAPS capsule Take 1 capsule (145 mcg total) by mouth daily before breakfast. 11/28/17   Armbruster, Carlota Raspberry, MD  losartan (COZAAR) 50 MG tablet Take 1 tablet (50 mg total) by mouth daily. 10/25/17   Rai, Ripudeep K, MD  magnesium citrate SOLN Take 1 Bottle by mouth once a week.    [provider]  magnesium gluconate (MAGONATE) 500 MG tablet Take 1,000 mg by mouth daily.     [provider]  nitroGLYCERIN (NITROSTAT) 0.4 MG SL tablet Place 1 tablet (0.4 mg total) under the tongue every 5 (five) minutes as needed for chest pain. 10/22/13   Rai, Vernelle Emerald, MD  polyethylene glycol (MIRALAX / GLYCOLAX) packet Take 34 g by mouth every other day.     [provider]  rOPINIRole (REQUIP) 2 MG tablet TAKE 1 TABLET BY MOUTH AT BEDTIME Patient taking differently:  TAKE 2 mg TABLET BY MOUTH AT BEDTIME 05/27/12   Dunn, Areta Haber, PA-C  senna (SENOKOT) 8.6 MG TABS tablet Take 2 tablets by mouth at bedtime.     [provider]   Social History   Socioeconomic History  . Marital status: Married    Spouse name: Not on file  . Number of children: 1  . Years of education: Not on file  . Highest education level: Not on file  Occupational History  . Occupation: retired  Scientific laboratory technician  . Financial resource strain: Not on file  . Food insecurity:    Worry: Not on file    Inability: Not on file  . Transportation needs:    Medical: Not on file    Non-medical: Not on file  Tobacco Use  . Smoking status: Current Every Day Smoker  Packs/day: 1.00    Years: 60.00    Pack years: 60.00    Types: Cigarettes  . Smokeless tobacco: Never Used  Substance and Sexual Activity  . Alcohol use: Yes    Alcohol/week: 0.0 oz    Comment: yes on occassion  . Drug use: No  . Sexual activity: Not on file  Lifestyle  . Physical activity:    Days per week: Not on file    Minutes per session: Not on file  . Stress: Not on file  Relationships  . Social connections:    Talks on phone: Not on file    Gets together: Not on file    Attends religious service: Not on file    Active member of club or organization: Not on file    Attends meetings of clubs or organizations: Not on file    Relationship status: Not on file  . Intimate partner violence:    Fear of current or ex partner: Not on file    Emotionally abused: Not on file    Physically abused: Not on file    Forced sexual activity: Not on file  Other Topics Concern  . Not on file  Social History Narrative   Brother in law Mr Rexford Maus (one of my patients)   Lives w/ husband       Review of Systems  Constitutional: Negative for chills, fatigue, fever and unexpected weight change.  Respiratory: Negative for cough.   Gastrointestinal: Negative for constipation, diarrhea, nausea and vomiting.    Musculoskeletal: Positive for myalgias.  Skin: Positive for color change. Negative for rash and wound.  Neurological: Positive for numbness. Negative for dizziness, weakness and headaches.       Objective:   Physical Exam  Constitutional: She is oriented to person, place, and time. She appears well-developed and well-nourished. No distress.  HENT:  Head: Normocephalic and atraumatic.  Eyes: Pupils are equal, round, and reactive to light. EOM are normal.  Neck: Neck supple.  Cardiovascular: Normal rate.  Pulmonary/Chest: Effort normal. No respiratory distress.  Musculoskeletal: Normal range of motion.  Left leg: strength appears intact, normal appearing gait, able to heel-toe walk Back: flexion to about 80 degrees, minimal extension, minimal rotation on the right, intact on the left, negative seated straight leg raise bilaterally; locates tenderness over left lower paraspinals towards SI, sciatic notch most tender  Neurological: She is alert and oriented to person, place, and time. She displays no Babinski's sign on the right side. She displays no Babinski's sign on the left side.  Reflex Scores:      Patellar reflexes are 2+ on the right side and 2+ on the left side.      Achilles reflexes are 2+ on the right side and 2+ on the left side. Skin: Skin is warm and dry.  Left foot: initially was pale appearing into great and 2nd toe; after placing in shoe, some improved coloration, however still has purple hue to the distal toes primarily 1st through 4th, difficulty palpating her DP pulse, cap refill at her great toe approximately 3-4 seconds; do not appreciate any swelling  Psychiatric: She has a normal mood and affect. Her behavior is normal.  Nursing note and vitals reviewed.   Vitals:   02/19/18 1619  BP: (!) 150/81  Pulse: 76  Resp: 17  Temp: 98.2 F (36.8 C)  TempSrc: Oral  SpO2: 98%  Weight: 156 lb (70.8 kg)  Height: 5\' 7"  (1.702 m)  Assessment & Plan:   [name] is a 72 y.o. female Discoloration of skin of toe - Plan: Ambulatory referral to Vascular Surgery  -Possible Raynauds.  Will refer to vascular surgery as reportedly worsening symptoms. Currently on calcium channel blocker.  Stressed importance of smoking cessation and link with smoking and raynauds  syndrome.   Arthralgia of left lower leg - Plan: Ambulatory referral to Vascular Surgery Left sided sciatica - Plan: DG Lumbar Spine Complete  -Possible claudication versus more likely sciatica.  Normal strength and reflexes.  No red flag symptoms at this time.  Tylenol over-the-counter discussed, refer to vascular for second evaluation on lower leg symptoms, continue physical therapy as planned,  referral to orthopedics, and follow-up next few weeks.  No orders of the defined types were placed in this encounter.  Patient Instructions   I suspect your low back pain and leg pain is from sciatica, but your strength and reflexes were normal today.  The discoloration of the toes was thought to be Raynaud's phenomenon before, but with those symptoms also worsening past few months we will refer you to a vascular specialist for further evaluation.  Tylenol may be safer than NSAIDs at this time. Follow-up with physical therapy as planned and see me in the next few weeks to determine any further steps.  I did refer you to orthopedist for your back pain as well.  Thank you for coming in today.    IF you received an x-ray today, you will receive an invoice from [facility]. Please contact [facility] at [phone] with questions or concerns regarding your invoice.   IF you received labwork today, you will receive an invoice from [place]. Please contact [facility] at [phone] with questions or concerns regarding your invoice.   Our billing staff will not be able to assist you with questions regarding bills from these companies.  You will be contacted with the lab results as soon as  they are available. The fastest way to get your results is to activate your My Chart account. Instructions are located on the last page of this paperwork. If you have not heard from [place] regarding the results in 2 weeks, please contact this office.       I personally performed the services described in this documentation, which was scribed in my presence. The recorded information has been reviewed and considered for accuracy and completeness, addended by me as needed, and agree with information above.  Signed,   Merri Ray, MD [facility].  02/24/18 5:16 PM

## 2018-02-20 ENCOUNTER — Other Ambulatory Visit: Payer: Self-pay

## 2018-02-20 DIAGNOSIS — I739 Peripheral vascular disease, unspecified: Secondary | ICD-10-CM

## 2018-02-21 ENCOUNTER — Encounter: Payer: Self-pay | Admitting: Family Medicine

## 2018-02-24 ENCOUNTER — Encounter: Payer: Self-pay | Admitting: Family Medicine

## 2018-02-25 ENCOUNTER — Ambulatory Visit: Payer: Medicare Other | Attending: Neurology | Admitting: Physical Therapy

## 2018-02-25 ENCOUNTER — Encounter: Payer: Self-pay | Admitting: Physical Therapy

## 2018-02-25 ENCOUNTER — Other Ambulatory Visit: Payer: Self-pay

## 2018-02-25 DIAGNOSIS — G8929 Other chronic pain: Secondary | ICD-10-CM | POA: Insufficient documentation

## 2018-02-25 DIAGNOSIS — R2681 Unsteadiness on feet: Secondary | ICD-10-CM

## 2018-02-25 DIAGNOSIS — M5442 Lumbago with sciatica, left side: Secondary | ICD-10-CM | POA: Insufficient documentation

## 2018-02-25 DIAGNOSIS — R2689 Other abnormalities of gait and mobility: Secondary | ICD-10-CM | POA: Insufficient documentation

## 2018-02-25 NOTE — Patient Instructions (Signed)
Piriformis Stretch, Supine    Lie supine, one ankle crossed onto opposite knee. Holding bottom leg behind knee, gently pull legs toward chest until stretch is felt in buttock of top leg. Hold ___ seconds. For deeper stretch gently push top knee away from body.  Repeat __1_ times per session. Do _3-5_ sessions per day.  HOLD 30-45 secs.    CROSS LEFT LEG OVER RIGHT LEG and pull up toward chest   Knee to Chest    Lying supine, bend involved knee to chest _1__ times. Repeat with other leg. Do _3_ times per day.  HOLD for 30 secs    DOUBLE KNEE TO CHEST - pull both legs up toward chest - hold 30 secs; do 1 rep 3 times/day

## 2018-02-26 ENCOUNTER — Encounter: Payer: Self-pay | Admitting: Family Medicine

## 2018-02-26 NOTE — Therapy (Signed)
[facility] [address] [place], [place], [address] Phone: [phone]   Fax:  [phone]  Physical Therapy Evaluation  Patient Details  Name: [name] MRN: [id number] Date of Birth: 1946/05/17 Referring Provider: Dr. Rosalin Hawking   Encounter Date: 02/25/2018  PT End of Session - 02/26/18 2106    Visit Number  1    Number of Visits  9    Date for PT Re-Evaluation  03/27/18    Authorization Type  Medicare    Authorization Time Period  02-25-18 - 05-26-18    PT Start Time  1112    PT Stop Time  1159    PT Time Calculation (min)  47 min       Past Medical History:  Diagnosis Date  . Bipolar disorder ([place])   . CAD (coronary artery disease)    intra and extracranial vascular dx per MRI 4/11, neurology rec strict CVRF control  . Colonic inertia   . Constipation    chronic;severe  . Depression   . Duodenitis   . EKG abnormalities    changes, stress test neg (false EKG changes)  . Gastritis   . GERD (gastroesophageal reflux disease)   . Hypertension   . Hypothyroid 01/16/2014  . Meningioma ([place]) 10/21/2013  . Psoriasis    sees derm  . Stroke [facility])     Past Surgical History:  Procedure Laterality Date  . arthroscopy  04/2010   Right knee  . TUBAL LIGATION      There were no vitals filed for this visit.   Subjective Assessment - 02/26/18 2057    Subjective  Pt states her problem started about 2 yrs ago with gait problem (describes foot slap in gait):  says she did some exercises which she had seen on TV program; states she walks as far as she can until the pain is severe; pt reports problem is partially due to sciatica as well as circulation problems.  Pt states she has been referred to vascular surgery regarding poor circulation in her legs.    Pertinent History  CVA in Feb. 2019:  CAD:  Depression;  bipolar disorder     Patient Stated Goals  "Decrease left leg pain so I can walk again"    Currently in  Pain?  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 Rosalin Hawking      Balance Screen   Has the patient fallen in the past 6 months  No    Has the patient had a decrease in activity level because of a fear of falling?   Yes    Is the patient reluctant to leave their home because of a fear of falling?   No      Home Environment   Living Environment  Private residence    Type of [place] to enter    Entrance Stairs-Number of Steps  1    Home Layout  Two level  Prior Function   Level of Independence  Independent      ROM / Strength   AROM / PROM / Strength  Strength      Strength   Overall Strength  Within functional limits for tasks performed      Ambulation/Gait   Ambulation/Gait  Yes    Ambulation/Gait Assistance  6: Modified independent (Device/Increase time)    Ambulation Distance (Feet)  75 Feet    Assistive device  None    Gait Pattern  Within Functional Limits    Ambulation Surface  Level;Indoor      Balance   Balance Assessed  Yes        Pt able to perform Rt SLS 10 secs LLE SLS - approx. 5 secs but c/o pain  Tandem stance 25 secs        Objective measurements completed on examination: See above findings.              PT Education - 02/26/18 2105    Education Details  see pt instructions - knee to chest, piriformis stretch    Person(s) Educated  Patient    Methods  Explanation;Demonstration;Handout    Comprehension  Verbalized understanding;Returned demonstration          PT Long Term Goals - 02/26/18 2116      PT LONG TERM GOAL #1   Title  Pt will report LBP to </ 2/10 intensity for increased  independence with ADL's.    Baseline  4/10 on 02-25-18    Time  4    Period  Weeks    Status  New    Target Date  03/27/18      PT LONG TERM GOAL #2   Title  Pt will amb. 10" nonstop with LBP pain </= 3/10 intensity.    Time  4    Period  Weeks    Status  New    Target Date  03/27/18      PT LONG TERM GOAL #3   Title  Pt will perform Lt SLS at least  7 secs to demo improved balance.    Baseline  approx. 5 secs with c/o pain on 02-25-18    Time  4    Period  Weeks    Status  New    Target Date  03/27/18      PT LONG TERM GOAL #4   Title  Independent in HEP for low back stretches and balance exs.    Time  4    Period  Weeks    Status  New    Target Date  03/27/18             Plan - 02/26/18 2108    Clinical Impression Statement  Pt is a 72 yr old lady with c/o low back pain with Lt sided sciatica and LLE pain which appears to be partially due to decreased circulation.  Pt is currently scheduled to see vascular surgeon for consult.  Pt states the LLE pain limits her ambulation distance and standng tolerance.      History and Personal Factors relevant to plan of care:  depression/bipolar disorder:  degenerative changes in back per x-ray;  sciatica LLE:  circulation problems in LLE:  RLS; CVA on 10-23-17;  h/o small meningioma    Clinical Presentation  Stable    Clinical Presentation due to:  low back pain with sciatica;  LLE arthralgia     Clinical Decision Making  Low  Rehab Potential  Good    PT Frequency  2x / week    PT Duration  4 weeks    PT Treatment/Interventions  ADLs/Self Care Home Management;Aquatic Therapy;Therapeutic exercise;Therapeutic activities;Gait training;Stair training;Balance training;Neuromuscular re-education;Patient/family education    PT Next Visit Plan  back exercises for stretching - check HEP given on 02-25-18    PT Home Exercise Plan  see pt instructions    Recommended Other Services  pt to receive aquatic therapy 1x/week    Consulted and  Agree with Plan of Care  Patient       Patient will benefit from skilled therapeutic intervention in order to improve the following deficits and impairments:  Decreased balance, Decreased strength, Difficulty walking, Pain  Visit Diagnosis: Chronic left-sided low back pain with left-sided sciatica - Plan: PT plan of care cert/re-cert  Other abnormalities of gait and mobility - Plan: PT plan of care cert/re-cert  Unsteadiness on feet - Plan: PT plan of care cert/re-cert     Problem List Patient Active Problem List   Diagnosis Date Noted  . Chronic left-sided low back pain with left-sided sciatica 12/25/2017  . Facial droop   . Hyperlipidemia   . CVA (cerebral vascular accident) ([place]) 10/23/2017  . Critical lower limb ischemia 11/08/2016  . Smoker 11/08/2016  . 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 Cigarette smoker 12/24/2015  . Hypothyroid ? 01/16/2014  . Mild cognitive impairment 01/16/2014  . Meningioma ([place]) 10/21/2013  . Chest pain 10/20/2013  . Medicare annual wellness visit, subsequent 06/16/2013  . Dizziness and giddiness 06/16/2013  . RLS (restless legs syndrome) 04/29/2012  . Abdominal pain 12/27/2010  . DEGENERATIVE JOINT DISEASE 09/23/2010  . CAROTID ARTERY DISEASE 08/15/2010  . CHEST PAIN 08/15/2010  . HEADACHE 12/02/2009  . BACK PAIN 11/22/2009  . CONSTIPATION, CHRONIC 01/29/2008  . NAUSEA 01/28/2008  . DEPRESSION 03/08/2007  . Essential hypertension 03/08/2007    Alda Lea, PT 02/26/2018, 9:28 PM  [facility] [address] [place], [place], [address] Phone: [phone]   Fax:  [phone]  Name: [name] MRN: [id number] Date of Birth: [birth year]

## 2018-02-28 DIAGNOSIS — F331 Major depressive disorder, recurrent, moderate: Secondary | ICD-10-CM | POA: Diagnosis not present

## 2018-03-04 ENCOUNTER — Ambulatory Visit: Payer: Medicare Other | Admitting: Physical Therapy

## 2018-03-04 ENCOUNTER — Telehealth: Payer: Self-pay | Admitting: Physical Therapy

## 2018-03-04 NOTE — Telephone Encounter (Signed)
Dr. Erlinda Hong, [name] was evaluated last week by PT - she would like to participate in our aquatic therapy as part of her PT.  Our program is getting started on Wed., 03-06-18 - if you agree to her participation in our program would you please sign the PT certification by Wed. And she will be able to start that part of her therapy.  Thank you, Guido Sander, PT

## 2018-03-05 ENCOUNTER — Ambulatory Visit (INDEPENDENT_AMBULATORY_CARE_PROVIDER_SITE_OTHER): Payer: Medicare Other | Admitting: Family Medicine

## 2018-03-05 ENCOUNTER — Encounter: Payer: Self-pay | Admitting: Family Medicine

## 2018-03-05 DIAGNOSIS — R2689 Other abnormalities of gait and mobility: Secondary | ICD-10-CM | POA: Diagnosis not present

## 2018-03-05 DIAGNOSIS — R5383 Other fatigue: Secondary | ICD-10-CM | POA: Diagnosis not present

## 2018-03-05 DIAGNOSIS — F331 Major depressive disorder, recurrent, moderate: Secondary | ICD-10-CM | POA: Diagnosis not present

## 2018-03-05 DIAGNOSIS — R011 Cardiac murmur, unspecified: Secondary | ICD-10-CM | POA: Diagnosis not present

## 2018-03-05 DIAGNOSIS — I6930 Unspecified sequelae of cerebral infarction: Secondary | ICD-10-CM | POA: Diagnosis not present

## 2018-03-05 DIAGNOSIS — R42 Dizziness and giddiness: Secondary | ICD-10-CM

## 2018-03-05 DIAGNOSIS — M5442 Lumbago with sciatica, left side: Secondary | ICD-10-CM

## 2018-03-05 DIAGNOSIS — Z8673 Personal history of transient ischemic attack (TIA), and cerebral infarction without residual deficits: Secondary | ICD-10-CM

## 2018-03-05 DIAGNOSIS — R739 Hyperglycemia, unspecified: Secondary | ICD-10-CM | POA: Diagnosis not present

## 2018-03-05 NOTE — Patient Instructions (Addendum)
For your back pain, I will refer you to orthopaedics as discussed. Ok to have some   I would recommend meeting with neurology group to discuss dizzy/balance issues. I will check some other tests as well but would like to follow up in the next week to review those results further as well as look into causes of fatigue including some of your medicines. I would also discuss those symptoms (fatigue and balance) with your psychiatrist to see if the psychiatric meds may be contributing.   Return to the clinic or go to the nearest emergency room if any of your symptoms worsen or new symptoms occur.    Dizziness Dizziness is a common problem. It is a feeling of unsteadiness or light-headedness. You may feel like you are about to faint. Dizziness can lead to injury if you stumble or fall. Anyone can become dizzy, but dizziness is more common in older adults. This condition can be caused by a number of things, including medicines, dehydration, or illness. Follow these instructions at home: Eating and drinking  Drink enough fluid to keep your urine clear or pale yellow. This helps to keep you from becoming dehydrated. Try to drink more clear fluids, such as water.  Do not drink alcohol.  Limit your caffeine intake if told to do so by your health care provider. Check ingredients and nutrition facts to see if a food or beverage contains caffeine.  Limit your salt (sodium) intake if told to do so by your health care provider. Check ingredients and nutrition facts to see if a food or beverage contains sodium. Activity  Avoid making quick movements. ? Rise slowly from chairs and steady yourself until you feel okay. ? In the morning, first sit up on the side of the bed. When you feel okay, stand slowly while you hold onto something until you know that your balance is fine.  If you need to stand in one place for a long time, move your legs often. Tighten and relax the muscles in your legs while you are  standing.  Do not drive or use heavy machinery if you feel dizzy.  Avoid bending down if you feel dizzy. Place items in your home so that they are easy for you to reach without leaning over. Lifestyle  Do not use any products that contain nicotine or tobacco, such as cigarettes and e-cigarettes. If you need help quitting, ask your health care provider.  Try to reduce your stress level by using methods such as yoga or meditation. Talk with your health care provider if you need help to manage your stress. General instructions  Watch your dizziness for any changes.  Take over-the-counter and prescription medicines only as told by your health care provider. Talk with your health care provider if you think that your dizziness is caused by a medicine that you are taking.  Tell a friend or a family member that you are feeling dizzy. If he or she notices any changes in your behavior, have this person call your health care provider.  Keep all follow-up visits as told by your health care provider. This is important. Contact a health care provider if:  Your dizziness does not go away.  Your dizziness or light-headedness gets worse.  You feel nauseous.  You have reduced hearing.  You have new symptoms.  You are unsteady on your feet or you feel like the room is spinning. Get help right away if:  You vomit or have diarrhea and are unable to  eat or drink anything.  You have problems talking, walking, swallowing, or using your arms, hands, or legs.  You feel generally weak.  You are not thinking clearly or you have trouble forming sentences. It may take a friend or family member to notice this.  You have chest pain, abdominal pain, shortness of breath, or sweating.  Your vision changes.  You have any bleeding.  You have a severe headache.  You have neck pain or a stiff neck.  You have a fever. These symptoms may represent a serious problem that is an emergency. Do not wait to  see if the symptoms will go away. Get medical help right away. Call your local emergency services (911 in the [place] Do not drive yourself to the hospital. Summary  Dizziness is a feeling of unsteadiness or light-headedness. This condition can be caused by a number of things, including medicines, dehydration, or illness.  Anyone can become dizzy, but dizziness is more common in older adults.  Drink enough fluid to keep your urine clear or pale yellow. Do not drink alcohol.  Avoid making quick movements if you feel dizzy. Monitor your dizziness for any changes. This information is not intended to replace advice given to you by your health care provider. Make sure you discuss any questions you have with your health care provider. Document Released: 02/21/2001 Document Revised: 09/30/2016 Document Reviewed: 09/30/2016 Elsevier Interactive Patient Education  2018 [employer].  Fatigue Fatigue is feeling tired all of the time, a lack of energy, or a lack of motivation. Occasional or mild fatigue is often a normal response to activity or life in general. However, long-lasting (chronic) or extreme fatigue may indicate an underlying medical condition. Follow these instructions at home: Watch your fatigue for any changes. The following actions may help to lessen any discomfort you are feeling:  Talk to your health care provider about how much sleep you need each night. Try to get the required amount every night.  Take medicines only as directed by your health care provider.  Eat a healthy and nutritious diet. Ask your health care provider if you need help changing your diet.  Drink enough fluid to keep your urine clear or pale yellow.  Practice ways of relaxing, such as yoga, meditation, massage therapy, or acupuncture.  Exercise regularly.  Change situations that cause you stress. Try to keep your work and personal routine reasonable.  Do not abuse illegal drugs.  Limit alcohol intake to  no more than 1 drink per day for nonpregnant women and 2 drinks per day for men. One drink equals 12 ounces of beer, 5 ounces of wine, or 1 ounces of hard liquor.  Take a multivitamin, if directed by your health care provider.  Contact a health care provider if:  Your fatigue does not get better.  You have a fever.  You have unintentional weight loss or gain.  You have headaches.  You have difficulty: ? Falling asleep. ? Sleeping throughout the night.  You feel angry, guilty, anxious, or sad.  You are unable to have a bowel movement (constipation).  You skin is dry.  Your legs or another part of your body is swollen. Get help right away if:  You feel confused.  Your vision is blurry.  You feel faint or pass out.  You have a severe headache.  You have severe abdominal, pelvic, or back pain.  You have chest pain, shortness of breath, or an irregular or fast heartbeat.  You are unable  to urinate or you urinate less than normal.  You develop abnormal bleeding, such as bleeding from the rectum, vagina, nose, lungs, or nipples.  You vomit blood.  You have thoughts about harming yourself or committing suicide.  You are worried that you might harm someone else. This information is not intended to replace advice given to you by your health care provider. Make sure you discuss any questions you have with your health care provider. Document Released: 06/25/2007 Document Revised: 02/03/2016 Document Reviewed: 12/30/2013 Elsevier Interactive Patient Education  2018 [employer].  IF you received an x-ray today, you will receive an invoice from [facility]. Please contact [facility] at [phone] with questions or concerns regarding your invoice.   IF you received labwork today, you will receive an invoice from [facility]. Please contact [facility] at [phone] with questions or concerns regarding your invoice.   Our billing staff will not be able to  assist you with questions regarding bills from these companies.  You will be contacted with the lab results as soon as they are available. The fastest way to get your results is to activate your My Chart account. Instructions are located on the last page of this paperwork. If you have not heard from [place] regarding the results in 2 weeks, please contact this office.

## 2018-03-05 NOTE — Progress Notes (Signed)
Subjective:  By signing my name below, I, [name], attest that this documentation has been prepared under the direction and in the presence of Merri Ray, MD. Electronically Signed: [name], [place]. 03/05/2018 , 5:49 PM .  Patient was seen in Room 3 .   Patient ID: [name], female    DOB: 1946/07/26, 72 y.o.   MRN: [id number] Chief Complaint  Patient presents with  . Back Pain   HPI [name] is a 72 y.o. female  Here for follow up of back pain. Last saw patient for establishing care on June 11th. She was complaining of persistent low back pain for at least 5 months with some possible sciatic in her left leg. She did have MRI lumbar spine done in April 2018 with multiple areas of degenerative changes, recess narrowing L3-4 and L4-5 with disc bulge, and trace anterolisthesis of L2-3. She was having discoloration in her toes as well; has seen cardiologist, thought Raynaud's as normal ABI's when evaluated in March 2018. She was started on amlodipine and smoking cessation was discussed. She was referred to vascular surgeon last visit as reported worsening symptoms.   She also has history of stroke previously in February 2019, and has been referred to PT/OT including for back pain. For back pain, normal strength and reflexes last visit. She was referred to vascular for lower leg pains to rule out claudication as cause. Recommended continuing PT and referred to orthopedics to decide on next step. PT note reviewed from June 17th at outpatient rehab, plan for possible aquatic therapy as well. We did perform lumbar spine xray on June 11th with degenerative changes noted, no fractures.   Patient states she has an appointment with vascular surgeon, Dr. Oneida Alar, on July 5th. She's just starting PT, still having some pain going down her left leg. She takes Aleve every now and then, and usually helps; denies taking Aleve daily. She denies any new weakness in her legs, urinary/bowel  incontinence, saddle anesthesia, night sweats or unexplained weight loss. She mentions working on smoking cessation.    Dizziness She had sent a MyChart email on June 11th; reported dizzy spells for the past year with possible worsening symptoms. Advised to schedule appointment ASAP to discuss further. She sent another email on June 13th and then again on June 18th regarding questions about her xray done on June 11th.   She was seen by neurologist, Dr. Erlinda Hong, for stroke follow up on April 16th; stroke on 10/23/17 noted to have small frontal meningioma, right hand clumsiness and right facial droop and slurred speech. MRI showed left CR and small infarct, EF 65-70% with severe LVH, treated with aspirin and Lipitor for stroke prevention, and PT/OT as above. Does not appear dizziness was discussed at that visit.   Patient states she's been overall feeling more fatigued and dizzy spells with some nausea where she has to hang onto stuff. When she's walking down streets, she feels a little wobbly, though not present all the time. She feels more off balance than spinning. She denies blurry vision, tinnitus, or ear pain.   She received call today regarding aquatic therapy, but she won't be able to go. She denies chest pain or shortness of breath. She notes having cognitive fog a couple times a week. She takes clonazepam 0.5 mg every night, as well as Requip 2 mg qhs for RLS; prescribed by psychiatrist, Dr. Clovis Pu, wanted her to take Cymbalta 90 mg.   Cardiac event monitor was ordered  but never done. Complete echocardiogram severe LVH, grade 1 diastolic dysfunction, indeterminate LV filling pressure, trivial aortic regurgitation, mitral valve regurgitation, no significant pulmonate or tricuspid regurgitation.   Lab Results  Component Value Date   WBC 8.3 10/24/2017   HGB 12.9 10/24/2017   HCT 40.3 10/24/2017   MCV 93.7 10/24/2017   PLT 206 10/24/2017    Pre-diabetes She had an A1C of 5.8 in February.    Patient Active Problem List   Diagnosis Date Noted  . Chronic left-sided low back pain with left-sided sciatica 12/25/2017  . Facial droop   . Hyperlipidemia   . CVA (cerebral vascular accident) ([place]) 10/23/2017  . Critical lower limb ischemia 11/08/2016  . Smoker 11/08/2016  . 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 Cigarette smoker 12/24/2015  . Hypothyroid ? 01/16/2014  . Mild cognitive impairment 01/16/2014  . Meningioma ([place]) 10/21/2013  . Chest pain 10/20/2013  . Medicare annual wellness visit, subsequent 06/16/2013  . Dizziness and giddiness 06/16/2013  . RLS (restless legs syndrome) 04/29/2012  . Abdominal pain 12/27/2010  . DEGENERATIVE JOINT DISEASE 09/23/2010  . CAROTID ARTERY DISEASE 08/15/2010  . CHEST PAIN 08/15/2010  . HEADACHE 12/02/2009  . BACK PAIN 11/22/2009  . CONSTIPATION, CHRONIC 01/29/2008  . NAUSEA 01/28/2008  . DEPRESSION 03/08/2007  . Essential hypertension 03/08/2007   Past Medical History:  Diagnosis Date  . Bipolar disorder ([place])   . CAD (coronary artery disease)    intra and extracranial vascular dx per MRI 4/11, neurology rec strict CVRF control  . Colonic inertia   . Constipation    chronic;severe  . Depression   . Duodenitis   . EKG abnormalities    changes, stress test neg (false EKG changes)  . Gastritis   . GERD (gastroesophageal reflux disease)   . Hypertension   . Hypothyroid 01/16/2014  . Meningioma ([place]) 10/21/2013  . Psoriasis    sees derm  . Stroke [facility])    Past Surgical History:  Procedure Laterality Date  . arthroscopy  04/2010   Right knee  . TUBAL LIGATION     No Known Allergies Prior to Admission medications   Medication Sig Start Date End Date Taking? Authorizing Provider  amLODipine (NORVASC) 10 MG tablet Take 1 tablet (10 mg total) by mouth daily. 10/25/17   Rai, Vernelle Emerald, MD  aspirin EC 325 MG EC tablet Take 1 tablet (325 mg total) by mouth daily. 10/25/17   Rai, Vernelle Emerald, MD  atorvastatin  (LIPITOR) 40 MG tablet Take 1 tablet (40 mg total) by mouth at bedtime. 10/25/17   Rai, Ripudeep K, MD  bisacodyl (DULCOLAX) 5 MG EC tablet Take 10 mg by mouth at bedtime.     [provider]  CASCARA SAGRADA PO Take 2 capsules by mouth at bedtime.    [provider]  clonazePAM (KLONOPIN) 0.5 MG tablet Take 0.5 mg by mouth at bedtime.    [provider]  DULoxetine (CYMBALTA) 30 MG capsule Take 60 mg by mouth daily.     [provider]  famotidine (PEPCID) 20 MG tablet One at bedtime 02/14/16   Tanda Rockers, MD  lamoTRIgine (LAMICTAL) 100 MG tablet Take 100 mg by mouth 2 (two) times daily. 12/18/17   [provider]  LamoTRIgine 100 MG TBDP Take 100 mg by mouth daily.     [provider]  linaclotide Rolan Lipa) 145 MCG CAPS capsule Take 1 capsule (145 mcg total) by mouth daily before breakfast. 11/28/17   Havery Moros, Remo Lipps  P, MD  losartan (COZAAR) 50 MG tablet Take 1 tablet (50 mg total) by mouth daily. 10/25/17   Rai, Ripudeep K, MD  magnesium citrate SOLN Take 1 Bottle by mouth once a week.    [provider]  magnesium gluconate (MAGONATE) 500 MG tablet Take 1,000 mg by mouth daily.     [provider]  nitroGLYCERIN (NITROSTAT) 0.4 MG SL tablet Place 1 tablet (0.4 mg total) under the tongue every 5 (five) minutes as needed for chest pain. 10/22/13   Rai, Vernelle Emerald, MD  polyethylene glycol (MIRALAX / GLYCOLAX) packet Take 34 g by mouth every other day.     [provider]  rOPINIRole (REQUIP) 2 MG tablet TAKE 1 TABLET BY MOUTH AT BEDTIME Patient taking differently: TAKE 2 mg TABLET BY MOUTH AT BEDTIME 05/27/12   Dunn, Areta Haber, PA-C  senna (SENOKOT) 8.6 MG TABS tablet Take 2 tablets by mouth at bedtime.     [provider]   Social History   Socioeconomic History  . Marital status: Married    Spouse name: Not on file  . Number of children: 1  . Years of education: Not on file  . Highest education level:  Not on file  Occupational History  . Occupation: retired  Scientific laboratory technician  . Financial resource strain: Not on file  . Food insecurity:    Worry: Not on file    Inability: Not on file  . Transportation needs:    Medical: Not on file    Non-medical: Not on file  Tobacco Use  . Smoking status: Current Every Day Smoker    Packs/day: 1.00    Years: 60.00    Pack years: 60.00    Types: Cigarettes  . Smokeless tobacco: Never Used  Substance and Sexual Activity  . Alcohol use: Yes    Alcohol/week: 0.0 oz    Comment: yes on occassion  . Drug use: No  . Sexual activity: Not on file  Lifestyle  . Physical activity:    Days per week: Not on file    Minutes per session: Not on file  . Stress: Not on file  Relationships  . Social connections:    Talks on phone: Not on file    Gets together: Not on file    Attends religious service: Not on file    Active member of club or organization: Not on file    Attends meetings of clubs or organizations: Not on file    Relationship status: Not on file  . Intimate partner violence:    Fear of current or ex partner: Not on file    Emotionally abused: Not on file    Physically abused: Not on file    Forced sexual activity: Not on file  Other Topics Concern  . Not on file  Social History Narrative   Brother in law Mr Rexford Maus (one of my patients)   Lives w/ husband       Review of Systems     Objective:   Physical Exam  Constitutional: She is oriented to person, place, and time. She appears well-developed and well-nourished. No distress.  HENT:  Head: Normocephalic and atraumatic.  Left ear: moderate cerumen, visualized TM appears normal.  Right ear: moderate cerumen, unable to visualize TM  Eyes: Pupils are equal, round, and reactive to light. Conjunctivae and EOM are normal. Right eye exhibits no nystagmus. Left eye exhibits no nystagmus.  Neck: Neck supple. Carotid bruit is not present.  Cardiovascular: Normal rate, regular  rhythm and intact distal pulses.  Murmur heard.  Systolic murmur is present with a grade of 3/6. Pulmonary/Chest: Effort normal and breath sounds normal. No respiratory distress.  Abdominal: Soft. She exhibits no pulsatile midline mass. There is no tenderness.  Musculoskeletal: Normal range of motion.  Describes pain over left lower paraspinals, lumbar spine no midline bony tenderness, skin intact, tender along left sciatic notch as well; flexion to 70 degrees, minimal extension, minimal lateral flexion L>R, equal rotation, able to heel-toe walk without difficulty; negative seated straight leg raise bilaterally  Neurological: She is alert and oriented to person, place, and time.  Skin: Skin is warm and dry.  Psychiatric: She has a normal mood and affect. Her behavior is normal.  Nursing note and vitals reviewed.   Vitals:   03/05/18 1626  BP: (!) 172/82  Pulse: 76  Resp: 17  Temp: 97.6 F (36.4 C)  TempSrc: Oral  SpO2: 98%  Weight: 158 lb (71.7 kg)  Height: 5\' 7"  (1.702 m)   EKG: sinus rhythm rate 64, no apparent significant change from Feb 2019 or acute findings.      Assessment & Plan:   KINZEE HAPPEL is a 72 y.o. female Dizzy spells - Plan: Comprehensive metabolic panel, Ambulatory referral to Cardiology, EKG 12-Lead, CANCELED: Ambulatory referral to Cardiology Balance problem - Plan: CANCELED: Ambulatory referral to Cardiology History of CVA (cerebrovascular accident) - Plan: Ambulatory referral to Cardiology, CANCELED: Ambulatory referral to Cardiology  -History of CVA.  Intermittent dizzy spells, without persistent symptoms..  Denies any associated palpitations, but she did not have the cardiac monitor performed.  Nonfocal neuro exam in office for any acute findings.  No apparent nystagmus.  Less likely BPV.  -Refer to cardiology for possible cardiac monitor and to discuss dizziness symptoms.  -Refer to neurology for evaluation of dizziness/balance issues since CVA.  -ER  precautions if any acute worsening.  Understanding expressed  -Encouraged to continue to work on smoking cessation continue physical therapy, but will refer to orthopedics as well.  Left-sided low back pain with left-sided sciatica, unspecified chronicity - Plan: Ambulatory referral to Orthopedic Surgery  -As above significant degenerative changes with sciatica.  Continue physical therapy but will refer to orthopedics as well.  Heart murmur - Plan: Ambulatory referral to Cardiology, EKG 12-Lead, CANCELED: Ambulatory referral to Cardiology  -As above.  Recent echo reviewed, but can discuss heart murmur with cardiology.  Fatigue, unspecified type - Plan: Comprehensive metabolic panel, TSH, CBC, EKG 12-Lead Hyperglycemia - Plan: Comprehensive metabolic panel  -Possible multiple causes of fatigue.  Check TSH, CBC, CMP and follow-up in the next 1 week to discuss those symptoms further.  ER/RTC precautions of acute worsening.  Understanding expressed  No orders of the defined types were placed in this encounter.  Patient Instructions    For your back pain, I will refer you to orthopaedics as discussed. Ok to have some   I would recommend meeting with neurology group to discuss dizzy/balance issues. I will check some other tests as well but would like to follow up in the next week to review those results further as well as look into causes of fatigue including some of your medicines. I would also discuss those symptoms (fatigue and balance) with your psychiatrist to see if the psychiatric meds may be contributing.   Return to the clinic or go to the nearest emergency room if any of your symptoms worsen or new symptoms occur.    Dizziness  Dizziness is a common problem. It is a feeling of unsteadiness or light-headedness. You may feel like you are about to faint. Dizziness can lead to injury if you stumble or fall. Anyone can become dizzy, but dizziness is more common in older adults. This condition  can be caused by a number of things, including medicines, dehydration, or illness. Follow these instructions at home: Eating and drinking  Drink enough fluid to keep your urine clear or pale yellow. This helps to keep you from becoming dehydrated. Try to drink more clear fluids, such as water.  Do not drink alcohol.  Limit your caffeine intake if told to do so by your health care provider. Check ingredients and nutrition facts to see if a food or beverage contains caffeine.  Limit your salt (sodium) intake if told to do so by your health care provider. Check ingredients and nutrition facts to see if a food or beverage contains sodium. Activity  Avoid making quick movements. ? Rise slowly from chairs and steady yourself until you feel okay. ? In the morning, first sit up on the side of the bed. When you feel okay, stand slowly while you hold onto something until you know that your balance is fine.  If you need to stand in one place for a long time, move your legs often. Tighten and relax the muscles in your legs while you are standing.  Do not drive or use heavy machinery if you feel dizzy.  Avoid bending down if you feel dizzy. Place items in your home so that they are easy for you to reach without leaning over. Lifestyle  Do not use any products that contain nicotine or tobacco, such as cigarettes and e-cigarettes. If you need help quitting, ask your health care provider.  Try to reduce your stress level by using methods such as yoga or meditation. Talk with your health care provider if you need help to manage your stress. General instructions  Watch your dizziness for any changes.  Take over-the-counter and prescription medicines only as told by your health care provider. Talk with your health care provider if you think that your dizziness is caused by a medicine that you are taking.  Tell a friend or a family member that you are feeling dizzy. If he or she notices any changes in  your behavior, have this person call your health care provider.  Keep all follow-up visits as told by your health care provider. This is important. Contact a health care provider if:  Your dizziness does not go away.  Your dizziness or light-headedness gets worse.  You feel nauseous.  You have reduced hearing.  You have new symptoms.  You are unsteady on your feet or you feel like the room is spinning. Get help right away if:  You vomit or have diarrhea and are unable to eat or drink anything.  You have problems talking, walking, swallowing, or using your arms, hands, or legs.  You feel generally weak.  You are not thinking clearly or you have trouble forming sentences. It may take a friend or family member to notice this.  You have chest pain, abdominal pain, shortness of breath, or sweating.  Your vision changes.  You have any bleeding.  You have a severe headache.  You have neck pain or a stiff neck.  You have a fever. These symptoms may represent a serious problem that is an emergency. Do not wait to see if the symptoms will go away. Get medical help  right away. Call your local emergency services (911 in the [place] Do not drive yourself to the hospital. Summary  Dizziness is a feeling of unsteadiness or light-headedness. This condition can be caused by a number of things, including medicines, dehydration, or illness.  Anyone can become dizzy, but dizziness is more common in older adults.  Drink enough fluid to keep your urine clear or pale yellow. Do not drink alcohol.  Avoid making quick movements if you feel dizzy. Monitor your dizziness for any changes. This information is not intended to replace advice given to you by your health care provider. Make sure you discuss any questions you have with your health care provider. Document Released: 02/21/2001 Document Revised: 09/30/2016 Document Reviewed: 09/30/2016 Elsevier Interactive Patient Education  2018  [employer].  Fatigue Fatigue is feeling tired all of the time, a lack of energy, or a lack of motivation. Occasional or mild fatigue is often a normal response to activity or life in general. However, long-lasting (chronic) or extreme fatigue may indicate an underlying medical condition. Follow these instructions at home: Watch your fatigue for any changes. The following actions may help to lessen any discomfort you are feeling:  Talk to your health care provider about how much sleep you need each night. Try to get the required amount every night.  Take medicines only as directed by your health care provider.  Eat a healthy and nutritious diet. Ask your health care provider if you need help changing your diet.  Drink enough fluid to keep your urine clear or pale yellow.  Practice ways of relaxing, such as yoga, meditation, massage therapy, or acupuncture.  Exercise regularly.  Change situations that cause you stress. Try to keep your work and personal routine reasonable.  Do not abuse illegal drugs.  Limit alcohol intake to no more than 1 drink per day for nonpregnant women and 2 drinks per day for men. One drink equals 12 ounces of beer, 5 ounces of wine, or 1 ounces of hard liquor.  Take a multivitamin, if directed by your health care provider.  Contact a health care provider if:  Your fatigue does not get better.  You have a fever.  You have unintentional weight loss or gain.  You have headaches.  You have difficulty: ? Falling asleep. ? Sleeping throughout the night.  You feel angry, guilty, anxious, or sad.  You are unable to have a bowel movement (constipation).  You skin is dry.  Your legs or another part of your body is swollen. Get help right away if:  You feel confused.  Your vision is blurry.  You feel faint or pass out.  You have a severe headache.  You have severe abdominal, pelvic, or back pain.  You have chest pain, shortness of breath, or  an irregular or fast heartbeat.  You are unable to urinate or you urinate less than normal.  You develop abnormal bleeding, such as bleeding from the rectum, vagina, nose, lungs, or nipples.  You vomit [name].  You have thoughts about harming yourself or committing suicide.  You are worried that you might harm someone else. This information is not intended to replace advice given to you by your health care provider. Make sure you discuss any questions you have with your health care provider. Document Released: 06/25/2007 Document Revised: 02/03/2016 Document Reviewed: 12/30/2013 Elsevier Interactive Patient Education  2018 [employer].  IF you received an x-ray today, you will receive an invoice from [facility]. Please contact [facility]  Radiology at [phone] with questions or concerns regarding your invoice.   IF you received labwork today, you will receive an invoice from [facility]. Please contact [facility] at [phone] with questions or concerns regarding your invoice.   Our billing staff will not be able to assist you with questions regarding bills from these companies.  You will be contacted with the lab results as soon as they are available. The fastest way to get your results is to activate your My Chart account. Instructions are located on the last page of this paperwork. If you have not heard from [place] regarding the results in 2 weeks, please contact this office.       I personally performed the services described in this documentation, which was scribed in my presence. The recorded information has been reviewed and considered for accuracy and completeness, addended by me as needed, and agree with information above.  Signed,   Merri Ray, MD [facility].  03/08/18 1:01 PM

## 2018-03-06 LAB — CBC
HEMATOCRIT: 41.9 % (ref 34.0–46.6)
Hemoglobin: 13.6 g/dL (ref 11.1–15.9)
MCH: 30.2 pg (ref 26.6–33.0)
MCHC: 32.5 g/dL (ref 31.5–35.7)
MCV: 93 fL (ref 79–97)
Platelets: 241 10*3/uL (ref 150–450)
RBC: 4.51 x10E6/uL (ref 3.77–5.28)
RDW: 14.5 % (ref 12.3–15.4)
WBC: 7.4 10*3/uL (ref 3.4–10.8)

## 2018-03-06 LAB — COMPREHENSIVE METABOLIC PANEL
ALK PHOS: 70 IU/L (ref 39–117)
ALT: 14 IU/L (ref 0–32)
AST: 16 IU/L (ref 0–40)
Albumin/Globulin Ratio: 1.5 (ref 1.2–2.2)
Albumin: 4.4 g/dL (ref 3.5–4.8)
BUN/Creatinine Ratio: 17 (ref 12–28)
BUN: 13 mg/dL (ref 8–27)
Bilirubin Total: 0.3 mg/dL (ref 0.0–1.2)
CO2: 27 mmol/L (ref 20–29)
CREATININE: 0.77 mg/dL (ref 0.57–1.00)
Calcium: 9.4 mg/dL (ref 8.7–10.3)
Chloride: 98 mmol/L (ref 96–106)
GFR calc non Af Amer: 76 mL/min/{1.73_m2} (ref >59)
GFR, EST AFRICAN AMERICAN: 88 mL/min/{1.73_m2} (ref >59)
GLOBULIN, TOTAL: 3 g/dL (ref 1.5–4.5)
Glucose: 94 mg/dL (ref 65–99)
Potassium: 4.3 mmol/L (ref 3.5–5.2)
SODIUM: 139 mmol/L (ref 134–144)
TOTAL PROTEIN: 7.4 g/dL (ref 6.0–8.5)

## 2018-03-06 LAB — TSH: TSH: 3.03 u[IU]/mL (ref 0.450–4.500)

## 2018-03-06 NOTE — Telephone Encounter (Signed)
Late entry from 03/05/2018. Rn spoke with [facility] therapist. Rn also stated pt has no more appts at [facility]. Also pt never went to therapy as Dr. Erlinda Hong suggested in April 2019. Rn also explain Dr. Erlinda Hong last day was 5/292019 at our office.Rn stated the aquatic therapy should be sent to pts PCP. RN stated pt saw PCP on 03/05/2018 for back pain and balance. Also the PCP order cardiology referral, orthopedic referral. Vinnie Level verbalized understanding.

## 2018-03-07 ENCOUNTER — Encounter: Payer: Self-pay | Admitting: Family Medicine

## 2018-03-07 ENCOUNTER — Ambulatory Visit: Payer: Medicare Other | Admitting: Physical Therapy

## 2018-03-08 ENCOUNTER — Encounter: Payer: Self-pay | Admitting: Family Medicine

## 2018-03-12 ENCOUNTER — Encounter: Payer: Self-pay | Admitting: Family Medicine

## 2018-03-12 ENCOUNTER — Ambulatory Visit (INDEPENDENT_AMBULATORY_CARE_PROVIDER_SITE_OTHER): Payer: Medicare Other | Admitting: Family Medicine

## 2018-03-12 VITALS — BP 150/82 | HR 70 | Temp 97.7°F | Ht 67.0 in | Wt 158.0 lb

## 2018-03-12 DIAGNOSIS — R5383 Other fatigue: Secondary | ICD-10-CM

## 2018-03-12 DIAGNOSIS — R030 Elevated blood-pressure reading, without diagnosis of hypertension: Secondary | ICD-10-CM | POA: Diagnosis not present

## 2018-03-12 DIAGNOSIS — R42 Dizziness and giddiness: Secondary | ICD-10-CM | POA: Diagnosis not present

## 2018-03-12 DIAGNOSIS — I639 Cerebral infarction, unspecified: Secondary | ICD-10-CM

## 2018-03-12 NOTE — Progress Notes (Signed)
Subjective:    Patient ID: [name], female    DOB: [birth year], 72 y.o.   MRN: [id number]  HPI  [name] is a 72 y.o. female Presents today for: Chief Complaint  Patient presents with  . Fatigue    feel weak and tired. a bit nausiated daily upon waking up    History of multiple medical problems including depression, hypertension, stroke in February of this year.  She was last seen June 25 with multiple concerns including episodic dizzy spells.  She reports episodes of fatigue and dizzy spells with nausea where she feels like she has to hand onto things, feels wobbly at times but the symptoms are not present all the time.  She reported feeling more off balance at times but denied any blurry vision, tinnitus or ear pain at that visit.  Denied any chest pains or shortness of breath symptoms.  Did have some reports of a "cognitive fog" a few times per week.  She was taking clonazepam 1.5 mg every night as well as Requip 2 mg nightly for restless leg syndrome and followed by psychiatrist, Dr. Clovis Pu for Cymbalta - planned on increase to 90mg  of Cymbalta.     She had not yet had the cardiac event monitor that was planned after her stroke in February.  Did have an echocardiogram with severe LVH, grade 1 diastolic dysfunction previously.  She had a nonfocal neurologic exam in the office last visit, no nystagmus on exam.  She was referred to cardiology to consider cardiac event monitor and to discuss dizziness episodes, as well as neurology for follow-up of dizziness and balance issues since those apparently had not been discussed at her previous follow-up.  With her fatigue multiple blood tests were obtained last visit as below, specifically normal hemoglobin, glucose, TSH, and electrolytes.  Today she is here for follow-up of the symptoms. Dizzy spells are less. Now taking 90mg  of Cymbalta last 2 weeks and same dose of lamictal. Feels like less brain fog at higher dose of cymbalta. Tired if  forgets cymbalta. Overall feels better than last week. No focal weakness. Still some general fatigue, but better than last week. Thinks may be related to taking meds at times. Not currently using a pill minder - not sure why. Doesn't want to take meds, but knows needs to. May have some resistance to taking meds, but no specific side effects. Possible that she may have doubled up on meds a few times in the past few months - specifically klonopin.   Results for orders placed or performed in visit on 03/05/18  Comprehensive metabolic panel  Result Value Ref Range   Glucose 94 65 - 99 mg/dL   BUN 13 8 - 27 mg/dL   Creatinine, Ser 0.77 0.57 - 1.00 mg/dL   GFR calc non Af Amer 76 >59 mL/min/1.73   GFR calc Af Amer 88 >59 mL/min/1.73   BUN/Creatinine Ratio 17 12 - 28   Sodium 139 134 - 144 mmol/L   Potassium 4.3 3.5 - 5.2 mmol/L   Chloride 98 96 - 106 mmol/L   CO2 27 20 - 29 mmol/L   Calcium 9.4 8.7 - 10.3 mg/dL   Total Protein 7.4 6.0 - 8.5 g/dL   Albumin 4.4 3.5 - 4.8 g/dL   Globulin, Total 3.0 1.5 - 4.5 g/dL   Albumin/Globulin Ratio 1.5 1.2 - 2.2   Bilirubin Total 0.3 0.0 - 1.2 mg/dL   Alkaline Phosphatase 70 39 - 117 IU/L  AST 16 0 - 40 IU/L   ALT 14 0 - 32 IU/L  TSH  Result Value Ref Range   TSH 3.030 0.450 - 4.500 uIU/mL  CBC  Result Value Ref Range   WBC 7.4 3.4 - 10.8 x10E3/uL   RBC 4.51 3.77 - 5.28 x10E6/uL   Hemoglobin 13.6 11.1 - 15.9 g/dL   Hematocrit 41.9 34.0 - 46.6 %   MCV 93 79 - 97 fL   MCH 30.2 26.6 - 33.0 pg   MCHC 32.5 31.5 - 35.7 g/dL   RDW 14.5 12.3 - 15.4 %   Platelets 241 150 - 450 x10E3/uL   Today she is here for follow-up of the symptoms. Dizzy spells are less. Now taking 90mg  of Cymbalta last 2 weeks and same dose of lamictal.     Patient Active Problem List   Diagnosis Date Noted  . Chronic left-sided low back pain with left-sided sciatica 12/25/2017  . Facial droop   . Hyperlipidemia   . CVA (cerebral vascular accident) ([place]) 10/23/2017  .  Critical lower limb ischemia 11/08/2016  . Smoker 11/08/2016  . 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 Cigarette smoker 12/24/2015  . Hypothyroid ? 01/16/2014  . Mild cognitive impairment 01/16/2014  . Meningioma ([place]) 10/21/2013  . Chest pain 10/20/2013  . Medicare annual wellness visit, subsequent 06/16/2013  . Dizziness and giddiness 06/16/2013  . RLS (restless legs syndrome) 04/29/2012  . Abdominal pain 12/27/2010  . DEGENERATIVE JOINT DISEASE 09/23/2010  . CAROTID ARTERY DISEASE 08/15/2010  . CHEST PAIN 08/15/2010  . HEADACHE 12/02/2009  . BACK PAIN 11/22/2009  . CONSTIPATION, CHRONIC 01/29/2008  . NAUSEA 01/28/2008  . DEPRESSION 03/08/2007  . Essential hypertension 03/08/2007   Past Medical History:  Diagnosis Date  . Bipolar disorder ([place])   . CAD (coronary artery disease)    intra and extracranial vascular dx per MRI 4/11, neurology rec strict CVRF control  . Colonic inertia   . Constipation    chronic;severe  . Depression   . Duodenitis   . EKG abnormalities    changes, stress test neg (false EKG changes)  . Gastritis   . GERD (gastroesophageal reflux disease)   . Hypertension   . Hypothyroid 01/16/2014  . Meningioma ([place]) 10/21/2013  . Psoriasis    sees derm  . Stroke [facility])    Past Surgical History:  Procedure Laterality Date  . arthroscopy  04/2010   Right knee  . TUBAL LIGATION     No Known Allergies Prior to Admission medications   Medication Sig Start Date End Date Taking? Authorizing Provider  amLODipine (NORVASC) 10 MG tablet Take 1 tablet (10 mg total) by mouth daily. 10/25/17  Yes Rai, Ripudeep K, MD  aspirin EC 325 MG EC tablet Take 1 tablet (325 mg total) by mouth daily. 10/25/17  Yes Rai, Ripudeep K, MD  atorvastatin (LIPITOR) 40 MG tablet Take 1 tablet (40 mg total) by mouth at bedtime. 10/25/17  Yes Rai, Ripudeep K, MD  bisacodyl (DULCOLAX) 5 MG EC tablet Take 10 mg by mouth at bedtime.    Yes [provider]  CASCARA  SAGRADA PO Take 2 capsules by mouth at bedtime.   Yes [provider]  clonazePAM (KLONOPIN) 0.5 MG tablet Take 0.5 mg by mouth at bedtime.   Yes [provider]  DULoxetine (CYMBALTA) 30 MG capsule Take 60 mg by mouth daily.    Yes [provider]  famotidine (PEPCID) 20 MG tablet One at bedtime 02/14/16  Yes  Tanda Rockers, MD  lamoTRIgine (LAMICTAL) 100 MG tablet Take 100 mg by mouth 2 (two) times daily. 12/18/17  Yes [provider]  linaclotide Rolan Lipa) 145 MCG CAPS capsule Take 1 capsule (145 mcg total) by mouth daily before breakfast. 11/28/17  Yes Armbruster, Carlota Raspberry, MD  losartan (COZAAR) 50 MG tablet Take 1 tablet (50 mg total) by mouth daily. 10/25/17  Yes Rai, Ripudeep K, MD  polyethylene glycol (MIRALAX / GLYCOLAX) packet Take 34 g by mouth every other day.    Yes [provider]  rOPINIRole (REQUIP) 2 MG tablet TAKE 1 TABLET BY MOUTH AT BEDTIME Patient taking differently: TAKE 2 mg TABLET BY MOUTH AT BEDTIME 05/27/12  Yes Dunn, Ryan M, PA-C  senna (SENOKOT) 8.6 MG TABS tablet Take 2 tablets by mouth at bedtime.    Yes [provider]   Social History   Socioeconomic History  . Marital status: Married    Spouse name: Not on file  . Number of children: 1  . Years of education: Not on file  . Highest education level: Not on file  Occupational History  . Occupation: retired  Scientific laboratory technician  . Financial resource strain: Not on file  . Food insecurity:    Worry: Not on file    Inability: Not on file  . Transportation needs:    Medical: Not on file    Non-medical: Not on file  Tobacco Use  . Smoking status: Current Every Day Smoker    Packs/day: 1.00    Years: 60.00    Pack years: 60.00    Types: Cigarettes  . Smokeless tobacco: Never Used  Substance and Sexual Activity  . Alcohol use: Yes    Alcohol/week: 0.0 oz    Comment: yes on occassion  . Drug use: No  . Sexual activity: Not on file  Lifestyle  . Physical  activity:    Days per week: Not on file    Minutes per session: Not on file  . Stress: Not on file  Relationships  . Social connections:    Talks on phone: Not on file    Gets together: Not on file    Attends religious service: Not on file    Active member of club or organization: Not on file    Attends meetings of clubs or organizations: Not on file    Relationship status: Not on file  . Intimate partner violence:    Fear of current or ex partner: Not on file    Emotionally abused: Not on file    Physically abused: Not on file    Forced sexual activity: Not on file  Other Topics Concern  . Not on file  Social History Narrative   Brother in law Mr Rexford Maus (one of my patients)   Lives w/ husband        Review of Systems     Objective:   Physical Exam  Constitutional: She is oriented to person, place, and time. She appears well-developed and well-nourished.  HENT:  Head: Normocephalic and atraumatic.  Eyes: Pupils are equal, round, and reactive to light. Conjunctivae and EOM are normal.  Neck: Carotid bruit is not present.  Cardiovascular: Normal rate, regular rhythm, normal heart sounds and intact distal pulses.  Pulmonary/Chest: Effort normal and breath sounds normal.  Abdominal: Soft. She exhibits no pulsatile midline mass. There is no tenderness.  Neurological: She is alert and oriented to person, place, and time.  Skin: Skin is warm and dry.  Psychiatric: She has a normal mood and affect. Her behavior is normal.  Vitals reviewed. Nonfocal neuro exam, no drift.  Vitals:   03/12/18 1622 03/12/18 1627  BP: (!) 175/88 (!) 150/82  Pulse: 70   Temp: 97.7 F (36.5 C)   TempSrc: Oral   SpO2: 98%   Weight: 158 lb (71.7 kg)   Height: 5\' 7"  (1.702 m)        Assessment & Plan:  AMANDALEE LACAP is a 72 y.o. female Dizzy spells  Fatigue, unspecified type  Elevated blood pressure reading  On further discussion may have some variation in timing of medications,  and potentially additional medication at times.  That may be contributing to some of her episodic dizzy spells.   - recommended pill minder and also discussed and demonstrated setting alarm on her phone for taking her medications.    - Continue follow-up as planned with orthopedics for sciatica symptoms as well as cardiology and neurology for dizzy spells possible cardiac monitoring.  RTC/ER precautions if worsening symptoms  -  additionally discussed borderline blood pressure and to monitor outside of office with RTC precautions if persistently elevated  No orders of the defined types were placed in this encounter.  Patient Instructions    I suspect the variation of medication timing and possible missed doses may be contributing to some your symptoms.  Your blood work was reassuring last visit. Please use a pill minder/pill box and alarm on your phone or Alexa to set reminder for timing of meds.    Continue follow-up with cardiology, neurology, and orthopedics as planned.  If any worsening of the dizzy spells, or balance issues, be seen here in the room if needed.    Blood pressure is borderline elevated today.  Please monitor that outside of the office, especially with regular dosing of medications and if it remains over 140/90,  please return to discuss medication changes.  Otherwise follow-up in 6 weeks.  Return to the clinic or go to the nearest emergency room if any of your symptoms worsen or new symptoms occur.  Thank you for coming in today.  IF you received an x-ray today, you will receive an invoice from [facility]. Please contact [facility] at [phone] with questions or concerns regarding your invoice.   IF you received labwork today, you will receive an invoice from [facility]. Please contact [facility] at [phone] with questions or concerns regarding your invoice.   Our billing staff will not be able to assist you with questions regarding bills from these  companies.  You will be contacted with the lab results as soon as they are available. The fastest way to get your results is to activate your My Chart account. Instructions are located on the last page of this paperwork. If you have not heard from [place] regarding the results in 2 weeks, please contact this office.       Signed,   Merri Ray, MD [facility] [place].  03/14/18 7:00 PM

## 2018-03-12 NOTE — Patient Instructions (Addendum)
I suspect the variation of medication timing and possible missed doses may be contributing to some your symptoms.  Your blood work was reassuring last visit. Please use a pill minder/pill box and alarm on your phone or Alexa to set reminder for timing of meds.    Continue follow-up with cardiology, neurology, and orthopedics as planned.  If any worsening of the dizzy spells, or balance issues, be seen here in the room if needed.    Blood pressure is borderline elevated today.  Please monitor that outside of the office, especially with regular dosing of medications and if it remains over 140/90,  please return to discuss medication changes.  Otherwise follow-up in 6 weeks.  Return to the clinic or go to the nearest emergency room if any of your symptoms worsen or new symptoms occur.  Thank you for coming in today.  IF you received an x-ray today, you will receive an invoice from [facility]. Please contact [facility] at [phone] with questions or concerns regarding your invoice.   IF you received labwork today, you will receive an invoice from [place]. Please contact [facility] at [phone] with questions or concerns regarding your invoice.   Our billing staff will not be able to assist you with questions regarding bills from these companies.  You will be contacted with the lab results as soon as they are available. The fastest way to get your results is to activate your My Chart account. Instructions are located on the last page of this paperwork. If you have not heard from [place] regarding the results in 2 weeks, please contact this office.

## 2018-03-14 ENCOUNTER — Encounter: Payer: Self-pay | Admitting: Family Medicine

## 2018-03-15 ENCOUNTER — Ambulatory Visit (HOSPITAL_COMMUNITY)
Admission: RE | Admit: 2018-03-15 | Discharge: 2018-03-15 | Disposition: A | Discharge status: Home / Self Care | Payer: Medicare Other | Source: Ambulatory Visit | Attending: Vascular Surgery | Admitting: Vascular Surgery

## 2018-03-15 ENCOUNTER — Ambulatory Visit (INDEPENDENT_AMBULATORY_CARE_PROVIDER_SITE_OTHER): Payer: Medicare Other | Admitting: Vascular Surgery

## 2018-03-15 ENCOUNTER — Encounter: Payer: Self-pay | Admitting: Vascular Surgery

## 2018-03-15 ENCOUNTER — Other Ambulatory Visit: Payer: Self-pay

## 2018-03-15 VITALS — BP 178/89 | HR 65 | Resp 18 | Ht 67.0 in | Wt 158.5 lb

## 2018-03-15 DIAGNOSIS — I70212 Atherosclerosis of native arteries of extremities with intermittent claudication, left leg: Secondary | ICD-10-CM

## 2018-03-15 DIAGNOSIS — F17210 Nicotine dependence, cigarettes, uncomplicated: Secondary | ICD-10-CM

## 2018-03-15 DIAGNOSIS — I739 Peripheral vascular disease, unspecified: Secondary | ICD-10-CM | POA: Insufficient documentation

## 2018-03-15 DIAGNOSIS — M5442 Lumbago with sciatica, left side: Secondary | ICD-10-CM

## 2018-03-15 DIAGNOSIS — G8929 Other chronic pain: Secondary | ICD-10-CM | POA: Diagnosis not present

## 2018-03-15 DIAGNOSIS — I639 Cerebral infarction, unspecified: Secondary | ICD-10-CM | POA: Diagnosis not present

## 2018-03-15 NOTE — Progress Notes (Addendum)
Requested by:  [name], MD [address] [place], [place] [address]  Reason for consultation: claudication LLE   History of Present Illness   [name] is a 72 y.o. ([birth year]) female who presents with chief complaint: L leg pain.  Patient states she has been experiencing this pain in her left leg over the past several years.  She experiences cramping in her buttock and calf after walking up half a flight of stairs or walking 1 block.  After resting for a minute or 2 she can repeat that same distance.  Patient also describes a history of lower back pain and sciatica which prevents her from pulling weeds and gardening.  She denies rest pain in her foot.  The patient also has discoloration in the first 3 toes of her left foot however patient states this purplish blue discoloration comes and goes.  She is taking an aspirin and a statin daily.  She is an everyday smoker.  Past Medical History:  Diagnosis Date  . Bipolar disorder ([place])   . CAD (coronary artery disease)    intra and extracranial vascular dx per MRI 4/11, neurology rec strict CVRF control  . Colonic inertia   . Constipation    chronic;severe  . Depression   . Duodenitis   . EKG abnormalities    changes, stress test neg (false EKG changes)  . Gastritis   . GERD (gastroesophageal reflux disease)   . Hypertension   . Hypothyroid 01/16/2014  . Meningioma ([place]) 10/21/2013  . Psoriasis    sees derm  . Stroke [facility])     Past Surgical History:  Procedure Laterality Date  . arthroscopy  04/2010   Right knee  . TUBAL LIGATION      Social History   Socioeconomic History  . Marital status: Married    Spouse name: Not on file  . Number of children: 1  . Years of education: Not on file  . Highest education level: Not on file  Occupational History  . Occupation: retired  Scientific laboratory technician  . Financial resource strain: Not on file  . Food insecurity:    Worry: Not on file    Inability: Not on file  .  Transportation needs:    Medical: Not on file    Non-medical: Not on file  Tobacco Use  . Smoking status: Current Every Day Smoker    Packs/day: 1.00    Years: 60.00    Pack years: 60.00    Types: Cigarettes  . Smokeless tobacco: Never Used  Substance and Sexual Activity  . Alcohol use: Yes    Alcohol/week: 0.0 oz    Comment: yes on occassion  . Drug use: No  . Sexual activity: Not on file  Lifestyle  . Physical activity:    Days per week: Not on file    Minutes per session: Not on file  . Stress: Not on file  Relationships  . Social connections:    Talks on phone: Not on file    Gets together: Not on file    Attends religious service: Not on file    Active member of club or organization: Not on file    Attends meetings of clubs or organizations: Not on file    Relationship status: Not on file  . Intimate partner violence:    Fear of current or ex partner: Not on file    Emotionally abused: Not on file    Physically abused: Not on file  Forced sexual activity: Not on file  Other Topics Concern  . Not on file  Social History Narrative   Brother in law Mr Rexford Maus (one of my patients)   Lives w/ husband        Family History  Problem Relation Age of Onset  . Breast cancer Mother        metastisis to bones  . Diabetes Son   . Heart disease Unknown        grandfather   . Alcohol abuse Brother   . Heart disease Brother   . Heart disease Maternal Aunt   . Lung cancer Brother        smoked  . Colon cancer Neg Hx     Current Outpatient Medications  Medication Sig Dispense Refill  . amLODipine (NORVASC) 10 MG tablet Take 1 tablet (10 mg total) by mouth daily. 30 tablet 4  . aspirin EC 325 MG EC tablet Take 1 tablet (325 mg total) by mouth daily. 30 tablet 4  . atorvastatin (LIPITOR) 40 MG tablet Take 1 tablet (40 mg total) by mouth at bedtime. 30 tablet 4  . bisacodyl (DULCOLAX) 5 MG EC tablet Take 10 mg by mouth at bedtime.     . CASCARA SAGRADA PO Take 2  capsules by mouth at bedtime.    . clonazePAM (KLONOPIN) 0.5 MG tablet Take 0.5 mg by mouth at bedtime.    . DULoxetine (CYMBALTA) 30 MG capsule Take 60 mg by mouth daily.     . famotidine (PEPCID) 20 MG tablet One at bedtime 30 tablet 11  . lamoTRIgine (LAMICTAL) 100 MG tablet Take 100 mg by mouth 2 (two) times daily.  0  . linaclotide (LINZESS) 145 MCG CAPS capsule Take 1 capsule (145 mcg total) by mouth daily before breakfast. 30 capsule 3  . losartan (COZAAR) 50 MG tablet Take 1 tablet (50 mg total) by mouth daily. 30 tablet 4  . polyethylene glycol (MIRALAX / GLYCOLAX) packet Take 34 g by mouth every other day.     Marland Kitchen rOPINIRole (REQUIP) 2 MG tablet TAKE 1 TABLET BY MOUTH AT BEDTIME (Patient taking differently: TAKE 2 mg TABLET BY MOUTH AT BEDTIME) 30 tablet 0  . senna (SENOKOT) 8.6 MG TABS tablet Take 2 tablets by mouth at bedtime.      No current facility-administered medications for this visit.     No Known Allergies  REVIEW OF SYSTEMS (negative unless checked):   Cardiac:  []  Chest pain or chest pressure? []  Shortness of breath upon activity? []  Shortness of breath when lying flat? []  Irregular heart rhythm?  Vascular:  [x]  Pain in calf, thigh, or hip brought on by walking? []  Pain in feet at night that wakes you up from your sleep? []  Blood clot in your veins? []  Leg swelling?  Pulmonary:  []  Oxygen at home? []  Productive cough? []  Wheezing?  Neurologic:  []  Sudden weakness in arms or legs? []  Sudden numbness in arms or legs? []  Sudden onset of difficult speaking or slurred speech? []  Temporary loss of vision in one eye? []  Problems with dizziness?  Gastrointestinal:  []  Blood in stool? []  Vomited blood?  Genitourinary:  []  Burning when urinating? []  Blood in urine?  Psychiatric:  []  Major depression  Hematologic:  []  Bleeding problems? []  Problems with blood clotting?  Dermatologic:  []  Rashes or ulcers?  Constitutional:  []  Fever or  chills?  Ear/Nose/Throat:  []  Change in hearing? []  Nose bleeds? []  Sore throat?  Musculoskeletal:  [  x] Back pain? [x]  Joint pain? []  Muscle pain?   For VQI Use Only   PRE-ADM LIVING Home  AMB STATUS Ambulatory  CAD Sx None  PRIOR CHF None  STRESS TEST No   Physical Examination     Vitals:   03/15/18 1423 03/15/18 1426  BP: (!) 187/87 (!) 178/89  Pulse: 65   Resp: 18   SpO2: 96%   Weight: 158 lb 8 oz (71.9 kg)   Height: 5\' 7"  (1.702 m)    Body mass index is 24.82 kg/m.  General Alert, O x 3, WD, NAD  Head [place]/AT,    Neck Supple, mid-line trachea,    Pulmonary Sym exp, good B air movt, CTA B  Cardiac RRR, Nl S1, S2,   Vascular Vessel Right Left  Radial Palpable Palpable  Brachial Palpable Palpable  Carotid Palpable, No Bruit Palpable, No Bruit  Aorta Not palpable N/A  Femoral Palpable Palpable  Popliteal Not palpable Not palpable  PT Faintly palpable Not palpable  DP Not palpable Not palpable    Gastro- intestinal soft, non-distended, non-tender to palpation,   Musculo- skeletal M/S 5/5 throughout  , Extremities without ischemic changes  , No edema present, No visible varicosities , No Lipodermatosclerosis present; purple, blue discoloration toes 1-3 L foot; discoloration resolves with elevation of LLE and manipulation of toes  Neurologic Cranial nerves 2-12 intact , Pain and light touch intact in extremities , Motor exam as listed above  Psychiatric Judgement intact, Mood & affect appropriate for pt's clinical situation  Dermatologic See M/S exam for extremity exam, No rashes otherwise noted  Lymphatic  Palpable lymph nodes: None    Non-Invasive Vascular imaging   BLE ABI (03/15/18)  R:   ABI: 0.97,   PT: bi  DP: mono  TBI:  0.51  L:   ABI: 0.44,   PT: mono  DP: mono  TBI: 0   Medical Decision Making   STEPHIE XU is a 72 y.o. female who presents with: LLE intermittent claudication.   Abnormal L ABI with vascular claudication  LLE  Also component of sciatica and radiculopathy contributing to pain in L buttock and back of L leg with bending, lifting, and standing  Patient states pain in calf with walking is affecting her quality of life  Dr. Oneida Alar offered the patient an arteriogram of LLE with possible revascularization.  She will think about this over the weekend and will call the office if she would like to schedule  If not, we will repeat ABIs in 3 months  Continue asa and statin  Encouraged walking program and smoking cessation  Educated patient on venous congestion causing discoloration of toes and how it does not represent active ischemia  Dagoberto Ligas PA-C [facility] [place] [facility]: [phone]  03/15/2018, 2:59 PM   History and exam findings as above.  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  She has fairly significant claudication systems which occur at short distance and are lifestyle limiting as she is not able to do hardly any activities without experiencing pain in her calf.  I discussed with her today the possibility of a walking program smoking cessation versus an intervention.  She is going to think about this for the next few days and decide if she wishes to proceed with an arteriogram or walking program.  Risk benefits possible complications of procedure details arteriogram angioplasty and stenting possible bypass the left leg were discussed with the patient.  She  understands and agrees to proceed.  If she decides not to proceed with an intervention at this point she will have a follow-up visit and repeat ABIs in 3 months.  Ruta Hinds, MD [facility] [place] Office: [phone] Pager: [phone]

## 2018-03-19 ENCOUNTER — Ambulatory Visit: Payer: Medicare Other | Admitting: Physical Therapy

## 2018-03-22 DIAGNOSIS — F331 Major depressive disorder, recurrent, moderate: Secondary | ICD-10-CM | POA: Diagnosis not present

## 2018-03-25 ENCOUNTER — Ambulatory Visit: Payer: Medicare Other | Attending: Neurology | Admitting: Physical Therapy

## 2018-04-01 ENCOUNTER — Ambulatory Visit: Payer: Medicare Other | Admitting: Physical Therapy

## 2018-04-12 ENCOUNTER — Encounter: Payer: Self-pay | Admitting: Cardiovascular Disease

## 2018-04-12 DIAGNOSIS — F331 Major depressive disorder, recurrent, moderate: Secondary | ICD-10-CM | POA: Diagnosis not present

## 2018-04-23 ENCOUNTER — Ambulatory Visit: Payer: Medicare Other | Admitting: Family Medicine

## 2018-04-30 ENCOUNTER — Encounter: Payer: Self-pay | Admitting: Family Medicine

## 2018-04-30 ENCOUNTER — Ambulatory Visit (INDEPENDENT_AMBULATORY_CARE_PROVIDER_SITE_OTHER): Payer: Medicare Other | Admitting: Family Medicine

## 2018-04-30 ENCOUNTER — Other Ambulatory Visit: Payer: Self-pay

## 2018-04-30 VITALS — BP 129/70 | HR 70 | Temp 97.7°F | Ht 68.0 in

## 2018-04-30 DIAGNOSIS — Z9189 Other specified personal risk factors, not elsewhere classified: Secondary | ICD-10-CM | POA: Diagnosis not present

## 2018-04-30 DIAGNOSIS — F32A Depression, unspecified: Secondary | ICD-10-CM

## 2018-04-30 DIAGNOSIS — R42 Dizziness and giddiness: Secondary | ICD-10-CM

## 2018-04-30 DIAGNOSIS — F329 Major depressive disorder, single episode, unspecified: Secondary | ICD-10-CM | POA: Diagnosis not present

## 2018-04-30 LAB — POCT CBC
Granulocyte percent: 65 %G (ref 37–80)
HCT, POC: 40.8 % (ref 37.7–47.9)
Hemoglobin: 13.1 g/dL (ref 12.2–16.2)
LYMPH, POC: 2.1 (ref 0.6–3.4)
MCH, POC: 28.5 pg (ref 27–31.2)
MCHC: 32.2 g/dL (ref 31.8–35.4)
MCV: 88.6 fL (ref 80–97)
MID (cbc): 0.6 (ref 0–0.9)
MPV: 6.3 fL (ref 0–99.8)
PLATELET COUNT, POC: 265 10*3/uL (ref 142–424)
POC Granulocyte: 5.1 (ref 2–6.9)
POC LYMPH %: 27.1 % (ref 10–50)
POC MID %: 7.9 %M (ref 0–12)
RBC: 4.6 M/uL (ref 4.04–5.48)
RDW, POC: 14.6 %
WBC: 7.9 10*3/uL (ref 4.6–10.2)

## 2018-04-30 LAB — GLUCOSE, POCT (MANUAL RESULT ENTRY): POC GLUCOSE: 74 mg/dL (ref 70–99)

## 2018-04-30 NOTE — Progress Notes (Signed)
Subjective:  By signing my name below, I, [name], attest that this documentation has been prepared under the direction and in the presence of Jenny Agreste, MD Electronically Signed: Ladene Artist, ED Scribe 04/30/2018 at 3:44 PM.   Patient ID: [name], female    DOB: 1946-04-22, 72 y.o.   MRN: [id number]  Chief Complaint  Patient presents with  . dizzy spells    f/u.. more dizzy yesterday and today.   HPI [name] is a 72 y.o. female who presents to Primary Care at [facility] for f/u on dizziness. Seen multiple times prev for similar symptoms, most recent 7/2. H/o depression, HTN, CVA in 10/2017, episodes of fatigue, dizziness with nausea, episodic balance issue, no associated cp or dyspnea. Reported "cognitive fog" a few times/wk. Had been taking klonopin 1.5 mg qhs and requip 2 mg qhs for ROS and Cymbalta for depression. Had been on Cymbalta 90 mg and Lamictal 100 mg bid. Felt improved at higher dose Cymbalta when seen 7/2. Some question of doubling up on meds, specifically klonopin. normal CMP, TSH, CBC. Recommended a pill minder and use of alarm on phone for reminder for med adherence. Referred prev to cardiology to discuss episodes and neuro given intermittent balance issues and poss cardiac event monitor planned after stroke in Feb. BP borderline elevated at 158/82, home monitoring and RTC precautions bu same dose amlodipine 10 mg qd, losartan 50 mg qd. Nonfocal neuro exam prev including no apparent nystagmus and EKG 6/25 normal sinus rhythm. She has seen vascular surgeon 7/5 for leg pain and LLE claudication. Option of LLE arteriogram with revascularization. Continued asa, statin, recommended smoking cessation. Toe discoloration thought to be venous congestion not active ischemia.  Mood Pt states that her sister, [name], who lived next door passed 1 month ago from [place]. She has also noticed worsening depression since which she has met with her therapist Barron Schmid and  psychiatrist Dr. Clovis Pu about. Last saw [name] ~1-2 wks after her passing but she has not seen Dr. Clovis Pu yet, however, she has an appointment with Dr. Clovis Pu for Sept. She does not have a f/u appointment scheduled with [place]. Pt does report fleeting thoughts of suicide the week following her sister's death without a plan, but denies SI at this time. She would like to visit [name] since her son and grandchildren live there. Denies missing doses of Cymbalta, but she has forgotten to take her dose today. She is taking Klonopin and Lamictal qhs. Also reports that she puts her BP meds in a dish which she takes throughout the day, different times a day. States her current living arrangement is very stressful as her husband, [name], has Alzheimer's and his son has been living with them since April and has started drinking again. Smoking ~3/4 of a ppd. Pt denies weakness in extremities, stroke-like symptoms, alcohol use.   I did speak with therapist who advised that she call to make an appointment as soon as possible.  Dizziness Pt states that dizziness began ~1 month ago when her sister passed, reports worsened symptoms over the past 3 days with standing and ambulating. Describes dizziness as "feeling separated from the world". She has not met with neuro yet; states she has to call them back. Denies weakness in extremities, change in meds, cp, palpations, bleeding.  LLE Pain Pt requests opinion of a different vascular surgeon. Still having LLE pain, unchanged at this time.  Patient Active Problem List   Diagnosis Date Noted  .  PAD (peripheral artery disease) ([place]) 03/15/2018  . Atherosclerosis of native artery of left lower extremity with intermittent claudication ([place]) 03/15/2018  . Chronic left-sided low back pain with left-sided sciatica 12/25/2017  . Facial droop   . Hyperlipidemia   . CVA (cerebral vascular accident) ([place]) 10/23/2017  . Critical lower limb ischemia 11/08/2016  . Smoker 11/08/2016   . 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 Cigarette smoker 12/24/2015  . Hypothyroid ? 01/16/2014  . Mild cognitive impairment 01/16/2014  . Meningioma ([place]) 10/21/2013  . Chest pain 10/20/2013  . Medicare annual wellness visit, subsequent 06/16/2013  . Dizziness and giddiness 06/16/2013  . RLS (restless legs syndrome) 04/29/2012  . Abdominal pain 12/27/2010  . DEGENERATIVE JOINT DISEASE 09/23/2010  . CAROTID ARTERY DISEASE 08/15/2010  . CHEST PAIN 08/15/2010  . HEADACHE 12/02/2009  . BACK PAIN 11/22/2009  . CONSTIPATION, CHRONIC 01/29/2008  . NAUSEA 01/28/2008  . DEPRESSION 03/08/2007  . Essential hypertension 03/08/2007   Past Medical History:  Diagnosis Date  . Bipolar disorder ([place])   . CAD (coronary artery disease)    intra and extracranial vascular dx per MRI 4/11, neurology rec strict CVRF control  . Colonic inertia   . Constipation    chronic;severe  . Depression   . Duodenitis   . EKG abnormalities    changes, stress test neg (false EKG changes)  . Gastritis   . GERD (gastroesophageal reflux disease)   . Hypertension   . Hypothyroid 01/16/2014  . Meningioma ([place]) 10/21/2013  . Psoriasis    sees derm  . Stroke [facility])    Past Surgical History:  Procedure Laterality Date  . arthroscopy  04/2010   Right knee  . TUBAL LIGATION     No Known Allergies Prior to Admission medications   Medication Sig Start Date End Date Taking? Authorizing Provider  amLODipine (NORVASC) 10 MG tablet Take 1 tablet (10 mg total) by mouth daily. 10/25/17   Rai, Vernelle Emerald, MD  aspirin EC 325 MG EC tablet Take 1 tablet (325 mg total) by mouth daily. 10/25/17   Rai, Vernelle Emerald, MD  atorvastatin (LIPITOR) 40 MG tablet Take 1 tablet (40 mg total) by mouth at bedtime. 10/25/17   Rai, Ripudeep K, MD  bisacodyl (DULCOLAX) 5 MG EC tablet Take 10 mg by mouth at bedtime.     [provider]  CASCARA SAGRADA PO Take 2 capsules by mouth at bedtime.    [provider]   clonazePAM (KLONOPIN) 0.5 MG tablet Take 0.5 mg by mouth at bedtime.    [provider]  DULoxetine (CYMBALTA) 30 MG capsule Take 60 mg by mouth daily.     [provider]  famotidine (PEPCID) 20 MG tablet One at bedtime 02/14/16   Tanda Rockers, MD  lamoTRIgine (LAMICTAL) 100 MG tablet Take 100 mg by mouth 2 (two) times daily. 12/18/17   [provider]  linaclotide Rolan Lipa) 145 MCG CAPS capsule Take 1 capsule (145 mcg total) by mouth daily before breakfast. 11/28/17   Armbruster, Carlota Raspberry, MD  losartan (COZAAR) 50 MG tablet Take 1 tablet (50 mg total) by mouth daily. 10/25/17   Rai, Vernelle Emerald, MD  polyethylene glycol (MIRALAX / GLYCOLAX) packet Take 34 g by mouth every other day.     [provider]  rOPINIRole (REQUIP) 2 MG tablet TAKE 1 TABLET BY MOUTH AT BEDTIME Patient taking differently: TAKE 2 mg TABLET BY MOUTH AT BEDTIME 05/27/12   Dunn, Areta Haber, PA-C  senna [facility])  8.6 MG TABS tablet Take 2 tablets by mouth at bedtime.     [provider]   Social History   Socioeconomic History  . Marital status: Married    Spouse name: Not on file  . Number of children: 1  . Years of education: Not on file  . Highest education level: Not on file  Occupational History  . Occupation: retired  Scientific laboratory technician  . Financial resource strain: Not on file  . Food insecurity:    Worry: Not on file    Inability: Not on file  . Transportation needs:    Medical: Not on file    Non-medical: Not on file  Tobacco Use  . Smoking status: Current Every Day Smoker    Packs/day: 1.00    Years: 60.00    Pack years: 60.00    Types: Cigarettes  . Smokeless tobacco: Never Used  Substance and Sexual Activity  . Alcohol use: Yes    Alcohol/week: 0.0 standard drinks    Comment: yes on occassion  . Drug use: No  . Sexual activity: Not on file  Lifestyle  . Physical activity:    Days per week: Not on file    Minutes per session: Not on file  . Stress: Not on  file  Relationships  . Social connections:    Talks on phone: Not on file    Gets together: Not on file    Attends religious service: Not on file    Active member of club or organization: Not on file    Attends meetings of clubs or organizations: Not on file    Relationship status: Not on file  . Intimate partner violence:    Fear of current or ex partner: Not on file    Emotionally abused: Not on file    Physically abused: Not on file    Forced sexual activity: Not on file  Other Topics Concern  . Not on file  Social History Narrative   Brother in law Mr Rexford Maus (one of my patients)   Lives w/ husband       Review of Systems  Cardiovascular: Negative for palpitations.  Musculoskeletal: Positive for myalgias.  Neurological: Positive for dizziness (intermittent). Negative for weakness.  Psychiatric/Behavioral: Positive for dysphoric mood and suicidal ideas (no active SI).      Objective:   Physical Exam  Constitutional: She is oriented to person, place, and time. She appears well-developed and well-nourished. No distress.  HENT:  Head: Normocephalic and atraumatic.  Eyes: Conjunctivae and EOM are normal.  Neck: Neck supple. No tracheal deviation present.  Cardiovascular: Normal rate.  Pulmonary/Chest: Effort normal. No respiratory distress.  Musculoskeletal: Normal range of motion.  Neurological: She is alert and oriented to person, place, and time.  Slightly unsteady but corrects on [place]berg. No pronator drift. No focal weakness.   Skin: Skin is warm and dry.  Psychiatric: She has a normal mood and affect. Her behavior is normal.  Nursing note and vitals reviewed.  Vitals:   04/30/18 1516  BP: 129/70  Pulse: 70  Temp: 97.7 F (36.5 C)  TempSrc: Oral  SpO2: 97%  Height: '5\' 8"'  (1.727 m)   EKG reading done by Jenny Agreste, MD: sinus rhythm rate 64, nonspecific T V6, nonspecific ST V2, V3 but does not appear changed from 02/2018.  Results for orders placed  or performed in visit on 04/30/18  POCT CBC  Result Value Ref Range   WBC 7.9 4.6 - 10.2 K/uL  Lymph, poc 2.1 0.6 - 3.4   POC LYMPH PERCENT 27.1 10 - 50 %L   MID (cbc) 0.6 0 - 0.9   POC MID % 7.9 0 - 12 %M   POC Granulocyte 5.1 2 - 6.9   Granulocyte percent 65.0 37 - 80 %G   RBC 4.60 4.04 - 5.48 M/uL   Hemoglobin 13.1 12.2 - 16.2 g/dL   HCT, POC 40.8 37.7 - 47.9 %   MCV 88.6 80 - 97 fL   MCH, POC 28.5 27 - 31.2 pg   MCHC 32.2 31.8 - 35.4 g/dL   RDW, POC 14.6 %   Platelet Count, POC 265 142 - 424 K/uL   MPV 6.3 0 - 99.8 fL  POCT glucose (manual entry)  Result Value Ref Range   POC Glucose 74 70 - 99 mg/dl      Assessment & Plan:  RENA HUNKE is a 72 y.o. female Dizziness - Plan: EKG 12-Lead, POCT CBC, POCT glucose (manual entry) At risk for polypharmacy  -Possible multiple contributors to dizziness including potential polypharmacy, variable timing of dosing of medication, possible duplicate dosing or missed doses, and now appears to have some worsening of her depression and adjustment to loss of her sister.  No acute findings on EKG, CBC stable, glucose okay.  -Nonfocal neuro exam, but still would recommend meeting with neurology to look into other causes of episodic dizziness.   -Planned event monitor with cardiology, stressed importance of scheduling that study has reviewed prior visits.  She did agree to schedule event monitor as well as meet with neurology.  If dizziness persists would also recommend evaluation with cardiology.    -Return within next week with home medications as well as dispensers to clarify dosing and possibly simplify dosing of her medications  -Depression discussed as below.  -ER/RTC precautions if acute worsening   Depression, unspecified depression type  -Worsening depression with adjustment from grieving, recent sister's loss.  Situational stressors at home as well.  Fleeting suicidal thoughts without intent or plan, denies current suicidal ideation.   Discussed with her therapist, and she plans on seeing him soon.  Phone numbers provided for suicide hotline and location of behavioral health or to go to ER for acute eval if the symptoms return.  Understanding expressed.  No orders of the defined types were placed in this encounter.  Patient Instructions   I'm sorry to hear about the loss of your sister.  Please continue to meet with your therapist to discuss grieving and working through this tough time. I put a call into your therapist, and he is happy to see you as soon as possible.  Please call his office to schedule appointment. If any return of thoughts of suicide  - call 911, suicide hotline, or proceed to emergency room or behavioral health for evaluation.   Behavioral Health [address]  [phone]  Suicide hotline: 1- [phone]  I am still concerned that you may be taking meds during the day at different times, or missing meds that may be contributing to your symptoms.please bring your meds and pill box/dispenser to next visit so we can look at possibly simplifying things to lessen risk of medication errors.   [facility] has sent you a letter to schedule your event monitor, but if dizziness, persists, I would also like you to meet with cardiology.  It is also important that you call neurology for appointment for your balance and dizziness symptoms. Please schedule  that as well if possible.   Return to the clinic or go to the nearest emergency room if any of your symptoms worsen or new symptoms occur.  Dizziness Dizziness is a common problem. It is a feeling of unsteadiness or light-headedness. You may feel like you are about to faint. Dizziness can lead to injury if you stumble or fall. Anyone can become dizzy, but dizziness is more common in older adults. This condition can be caused by a number of things, including medicines, dehydration, or illness. Follow these instructions at home: Eating and  drinking  Drink enough fluid to keep your urine clear or pale yellow. This helps to keep you from becoming dehydrated. Try to drink more clear fluids, such as water.  Do not drink alcohol.  Limit your caffeine intake if told to do so by your health care provider. Check ingredients and nutrition facts to see if a food or beverage contains caffeine.  Limit your salt (sodium) intake if told to do so by your health care provider. Check ingredients and nutrition facts to see if a food or beverage contains sodium. Activity  Avoid making quick movements. ? Rise slowly from chairs and steady yourself until you feel okay. ? In the morning, first sit up on the side of the bed. When you feel okay, stand slowly while you hold onto something until you know that your balance is fine.  If you need to stand in one place for a long time, move your legs often. Tighten and relax the muscles in your legs while you are standing.  Do not drive or use heavy machinery if you feel dizzy.  Avoid bending down if you feel dizzy. Place items in your home so that they are easy for you to reach without leaning over. Lifestyle  Do not use any products that contain nicotine or tobacco, such as cigarettes and e-cigarettes. If you need help quitting, ask your health care provider.  Try to reduce your stress level by using methods such as yoga or meditation. Talk with your health care provider if you need help to manage your stress. General instructions  Watch your dizziness for any changes.  Take over-the-counter and prescription medicines only as told by your health care provider. Talk with your health care provider if you think that your dizziness is caused by a medicine that you are taking.  Tell a friend or a family member that you are feeling dizzy. If he or she notices any changes in your behavior, have this person call your health care provider.  Keep all follow-up visits as told by your health care provider.  This is important. Contact a health care provider if:  Your dizziness does not go away.  Your dizziness or light-headedness gets worse.  You feel nauseous.  You have reduced hearing.  You have new symptoms.  You are unsteady on your feet or you feel like the room is spinning. Get help right away if:  You vomit or have diarrhea and are unable to eat or drink anything.  You have problems talking, walking, swallowing, or using your arms, hands, or legs.  You feel generally weak.  You are not thinking clearly or you have trouble forming sentences. It may take a friend or family member to notice this.  You have chest pain, abdominal pain, shortness of breath, or sweating.  Your vision changes.  You have any bleeding.  You have a severe headache.  You have neck pain or a stiff neck.  You have a fever. These symptoms may represent a serious problem that is an emergency. Do not wait to see if the symptoms will go away. Get medical help right away. Call your local emergency services (911 in the [place] Do not drive yourself to the hospital. Summary  Dizziness is a feeling of unsteadiness or light-headedness. This condition can be caused by a number of things, including medicines, dehydration, or illness.  Anyone can become dizzy, but dizziness is more common in older adults.  Drink enough fluid to keep your urine clear or pale yellow. Do not drink alcohol.  Avoid making quick movements if you feel dizzy. Monitor your dizziness for any changes. This information is not intended to replace advice given to you by your health care provider. Make sure you discuss any questions you have with your health care provider. Document Released: 02/21/2001 Document Revised: 09/30/2016 Document Reviewed: 09/30/2016 Elsevier Interactive Patient Education  [employer].   If you have lab work done today you will be contacted with your lab results within the next 2 weeks.  If you have not  heard from [place] then please contact us. The fastest way to get your results is to register for My Chart.   IF you received an x-ray today, you will receive an invoice from [facility]. Please contact [facility] at [phone] with questions or concerns regarding your invoice.   IF you received labwork today, you will receive an invoice from [facility]. Please contact [facility] at [phone] with questions or concerns regarding your invoice.   Our billing staff will not be able to assist you with questions regarding bills from these companies.  You will be contacted with the lab results as soon as they are available. The fastest way to get your results is to activate your My Chart account. Instructions are located on the last page of this paperwork. If you have not heard from [place] regarding the results in 2 weeks, please contact this office.       I personally performed the services described in this documentation, which was scribed in my presence. The recorded information has been reviewed and considered for accuracy and completeness, addended by me as needed, and agree with information above.  Signed,   Merri Ray, MD Primary Care at [place].  05/01/18 10:04 PM

## 2018-04-30 NOTE — Patient Instructions (Addendum)
I'm sorry to hear about the loss of your sister.  Please continue to meet with your therapist to discuss grieving and working through this tough time. I put a call into your therapist, and he is happy to see you as soon as possible.  Please call his office to schedule appointment. If any return of thoughts of suicide  - call 911, suicide hotline, or proceed to emergency room or behavioral health for evaluation.   Behavioral Health [address]  [phone]  Suicide hotline: 1- [phone]  I am still concerned that you may be taking meds during the day at different times, or missing meds that may be contributing to your symptoms.please bring your meds and pill box/dispenser to next visit so we can look at possibly simplifying things to lessen risk of medication errors.   [facility] has sent you a letter to schedule your event monitor, but if dizziness, persists, I would also like you to meet with cardiology.  It is also important that you call neurology for appointment for your balance and dizziness symptoms. Please schedule that as well if possible.   Return to the clinic or go to the nearest emergency room if any of your symptoms worsen or new symptoms occur.  Dizziness Dizziness is a common problem. It is a feeling of unsteadiness or light-headedness. You may feel like you are about to faint. Dizziness can lead to injury if you stumble or fall. Anyone can become dizzy, but dizziness is more common in older adults. This condition can be caused by a number of things, including medicines, dehydration, or illness. Follow these instructions at home: Eating and drinking  Drink enough fluid to keep your urine clear or pale yellow. This helps to keep you from becoming dehydrated. Try to drink more clear fluids, such as water.  Do not drink alcohol.  Limit your caffeine intake if told to do so by your health care provider. Check ingredients and nutrition facts to see if a food or  beverage contains caffeine.  Limit your salt (sodium) intake if told to do so by your health care provider. Check ingredients and nutrition facts to see if a food or beverage contains sodium. Activity  Avoid making quick movements. ? Rise slowly from chairs and steady yourself until you feel okay. ? In the morning, first sit up on the side of the bed. When you feel okay, stand slowly while you hold onto something until you know that your balance is fine.  If you need to stand in one place for a long time, move your legs often. Tighten and relax the muscles in your legs while you are standing.  Do not drive or use heavy machinery if you feel dizzy.  Avoid bending down if you feel dizzy. Place items in your home so that they are easy for you to reach without leaning over. Lifestyle  Do not use any products that contain nicotine or tobacco, such as cigarettes and e-cigarettes. If you need help quitting, ask your health care provider.  Try to reduce your stress level by using methods such as yoga or meditation. Talk with your health care provider if you need help to manage your stress. General instructions  Watch your dizziness for any changes.  Take over-the-counter and prescription medicines only as told by your health care provider. Talk with your health care provider if you think that your dizziness is caused by a medicine that you are taking.  Tell a friend  or a family member that you are feeling dizzy. If he or she notices any changes in your behavior, have this person call your health care provider.  Keep all follow-up visits as told by your health care provider. This is important. Contact a health care provider if:  Your dizziness does not go away.  Your dizziness or light-headedness gets worse.  You feel nauseous.  You have reduced hearing.  You have new symptoms.  You are unsteady on your feet or you feel like the room is spinning. Get help right away if:  You vomit  or have diarrhea and are unable to eat or drink anything.  You have problems talking, walking, swallowing, or using your arms, hands, or legs.  You feel generally weak.  You are not thinking clearly or you have trouble forming sentences. It may take a friend or family member to notice this.  You have chest pain, abdominal pain, shortness of breath, or sweating.  Your vision changes.  You have any bleeding.  You have a severe headache.  You have neck pain or a stiff neck.  You have a fever. These symptoms may represent a serious problem that is an emergency. Do not wait to see if the symptoms will go away. Get medical help right away. Call your local emergency services (911 in the [place] Do not drive yourself to the hospital. Summary  Dizziness is a feeling of unsteadiness or light-headedness. This condition can be caused by a number of things, including medicines, dehydration, or illness.  Anyone can become dizzy, but dizziness is more common in older adults.  Drink enough fluid to keep your urine clear or pale yellow. Do not drink alcohol.  Avoid making quick movements if you feel dizzy. Monitor your dizziness for any changes. This information is not intended to replace advice given to you by your health care provider. Make sure you discuss any questions you have with your health care provider. Document Released: 02/21/2001 Document Revised: 09/30/2016 Document Reviewed: 09/30/2016 Elsevier Interactive Patient Education  [employer].   If you have lab work done today you will be contacted with your lab results within the next 2 weeks.  If you have not heard from [place] then please contact us. The fastest way to get your results is to register for My Chart.   IF you received an x-ray today, you will receive an invoice from [facility]. Please contact [facility] at [phone] with questions or concerns regarding your invoice.   IF you received labwork  today, you will receive an invoice from [facility]. Please contact [facility] at [phone] with questions or concerns regarding your invoice.   Our billing staff will not be able to assist you with questions regarding bills from these companies.  You will be contacted with the lab results as soon as they are available. The fastest way to get your results is to activate your My Chart account. Instructions are located on the last page of this paperwork. If you have not heard from [place] regarding the results in 2 weeks, please contact this office.

## 2018-05-02 ENCOUNTER — Encounter: Payer: Self-pay | Admitting: Family Medicine

## 2018-05-03 DIAGNOSIS — F331 Major depressive disorder, recurrent, moderate: Secondary | ICD-10-CM | POA: Diagnosis not present

## 2018-05-07 ENCOUNTER — Ambulatory Visit: Payer: Medicare Other | Admitting: Family Medicine

## 2018-05-23 ENCOUNTER — Ambulatory Visit (INDEPENDENT_AMBULATORY_CARE_PROVIDER_SITE_OTHER): Payer: Medicare Other | Admitting: Neurology

## 2018-05-23 ENCOUNTER — Encounter: Payer: Self-pay | Admitting: Neurology

## 2018-05-23 VITALS — BP 131/68 | HR 72 | Ht 68.0 in

## 2018-05-23 DIAGNOSIS — R42 Dizziness and giddiness: Secondary | ICD-10-CM

## 2018-05-23 DIAGNOSIS — I639 Cerebral infarction, unspecified: Secondary | ICD-10-CM

## 2018-05-23 MED ORDER — MECLIZINE HCL 12.5 MG PO TABS: 12.5000 mg | ORAL_TABLET | Freq: Three times a day (TID) | ORAL | 2 refills | Status: DC

## 2018-05-23 NOTE — Progress Notes (Signed)
Reason for visit: Chronic dizziness  Referring physician: Dr. Ambrose Mantle is a 72 y.o. female  History of present illness:  [name] is a 72 year old right-handed white female with a history of bipolar disorder and cerebrovascular disease.  The patient was admitted to the hospital around 24 October 2017 with a left corona radiata stroke event.  The patient however claims that she has had some problems with dizziness and some headaches for 4 or 5 years.  The patient was followed by Dr. Orlene Och.  The patient underwent MRI evaluation of the brain in 2017.  This showed a mild to moderate level of small vessel ischemic changes at that time, the patient has a small left parietal meningioma without compression of the cortex.  The patient does not relate any medication adjustments to the onset of the dizziness.  The dizziness is associated with a vertigo sensation, only occurring with sitting or standing, not while lying down.  Any change in body position, looking up or looking down may bring on the dizziness.  The patient has a chronic nausea that is present at all times, not always related to the vertigo or dizzy sensations.  The patient denies a lot of neck pain, she does get headaches to 3 times a week, usually in the left frontotemporal area, occasionally associated with sharp jabbing pains in the left eye or left ear area.  The patient believes that she was on Topamax previously for her headache but could not tolerate due to stomach upset.  The patient smokes cigarettes, she has peripheral vascular disease affecting the left leg, she has pain in the left leg with walking and is limited in her ability to walk because of this.  The patient may use a cane for ambulation.  The patient also has restless leg syndrome.  She is under a lot of stress, she lives with her husband with significant Alzheimer's disease, and her sister just passed away with lymphoma several months ago.  The patient  comes to this office for an evaluation.  Past Medical History:  Diagnosis Date  . Bipolar disorder ([place])   . CAD (coronary artery disease)    intra and extracranial vascular dx per MRI 4/11, neurology rec strict CVRF control  . Colonic inertia   . Constipation    chronic;severe  . Depression   . Duodenitis   . EKG abnormalities    changes, stress test neg (false EKG changes)  . Gastritis   . GERD (gastroesophageal reflux disease)   . Hypertension   . Hypothyroid 01/16/2014  . Meningioma ([place]) 10/21/2013  . Psoriasis    sees derm  . Stroke [facility])     Past Surgical History:  Procedure Laterality Date  . arthroscopy  04/2010   Right knee  . TUBAL LIGATION      Family History  Problem Relation Age of Onset  . Breast cancer Mother        metastisis to bones  . Diabetes Son   . Heart disease Unknown        grandfather   . Alcohol abuse Brother   . Heart disease Brother   . Heart disease Maternal Aunt   . Lung cancer Brother        smoked  . Colon cancer Neg Hx     Social history:  reports that she has been smoking cigarettes. She has a 60.00 pack-year smoking history. She has never used smokeless tobacco. She reports that she drinks  alcohol. She reports that she does not use drugs.  Medications:  Prior to Admission medications   Medication Sig Start Date End Date Taking? Authorizing Provider  amLODipine (NORVASC) 10 MG tablet Take 1 tablet (10 mg total) by mouth daily. 10/25/17  Yes Rai, Ripudeep K, MD  aspirin EC 325 MG EC tablet Take 1 tablet (325 mg total) by mouth daily. 10/25/17  Yes Rai, Ripudeep K, MD  atorvastatin (LIPITOR) 40 MG tablet Take 1 tablet (40 mg total) by mouth at bedtime. 10/25/17  Yes Rai, Ripudeep K, MD  bisacodyl (DULCOLAX) 5 MG EC tablet Take 10 mg by mouth at bedtime.    Yes [provider]  CASCARA SAGRADA PO Take 2 capsules by mouth at bedtime.   Yes [provider]  clonazePAM (KLONOPIN) 0.5 MG tablet Take 0.5 mg by mouth at  bedtime.   Yes [provider]  DULoxetine (CYMBALTA) 30 MG capsule Take 90 mg by mouth daily.   Yes [provider]  famotidine (PEPCID) 20 MG tablet One at bedtime 02/14/16  Yes Tanda Rockers, MD  lamoTRIgine (LAMICTAL) 100 MG tablet Take 150 mg by mouth 2 (two) times daily.  12/18/17  Yes [provider]  linaclotide Rolan Lipa) 145 MCG CAPS capsule Take 1 capsule (145 mcg total) by mouth daily before breakfast. 11/28/17  Yes Armbruster, Carlota Raspberry, MD  losartan (COZAAR) 50 MG tablet Take 1 tablet (50 mg total) by mouth daily. 10/25/17  Yes Rai, Ripudeep K, MD  polyethylene glycol (MIRALAX / GLYCOLAX) packet Take 34 g by mouth every other day.    Yes [provider]  rOPINIRole (REQUIP) 2 MG tablet TAKE 1 TABLET BY MOUTH AT BEDTIME Patient taking differently: TAKE 2 mg TABLET BY MOUTH AT BEDTIME 05/27/12  Yes Dunn, Ryan M, PA-C  senna (SENOKOT) 8.6 MG TABS tablet Take 2 tablets by mouth at bedtime.    Yes [provider]     No Known Allergies  ROS:  Out of a complete 14 system review of symptoms, the patient complains only of the following symptoms, and all other reviewed systems are negative.  Fatigue Palpitations of the heart Ringing in the ear, dizziness Eye pain Cough Constipation Easy bruising Feeling hot Joint pain, aching muscles Memory loss, confusion, weakness, dizziness Depression, anxiety, too much sleep, decreased energy, disinterest in activities, suicidal thoughts Snoring, restless legs  Blood pressure 131/68, pulse 72, height 5\' 8"  (1.727 m), SpO2 96 %.  Physical Exam  General: The patient is alert and cooperative at the time of the examination.  Eyes: Pupils are equal, round, and reactive to light. Discs are flat bilaterally.  Ears: Tympanic membrane on the left is clear, patient has a cerumen plug on the right.  Neck: The neck is supple, no carotid bruits are noted.  Respiratory: The respiratory examination is  clear.  Cardiovascular: The cardiovascular examination reveals a regular rate and rhythm, no obvious murmurs or rubs are noted.  Skin: Extremities are without significant edema.  Neurologic Exam  Mental status: The patient is alert and oriented x 3 at the time of the examination. The patient has apparent normal recent and remote memory, with an apparently normal attention span and concentration ability.  Cranial nerves: Facial symmetry is present. There is good sensation of the face to pinprick and soft touch bilaterally. The strength of the facial muscles and the muscles to head turning and shoulder shrug are normal bilaterally. Speech is well enunciated, no aphasia or dysarthria is noted. Extraocular  movements are full. Visual fields are full. The tongue is midline, and the patient has symmetric elevation of the soft palate. No obvious hearing deficits are noted.  Motor: The motor testing reveals 5 over 5 strength of all 4 extremities. Good symmetric motor tone is noted throughout.  Sensory: Sensory testing is intact to pinprick, soft touch, vibration sensation, and position sense on all 4 extremities. No evidence of extinction is noted.  Coordination: Cerebellar testing reveals good finger-nose-finger and heel-to-shin bilaterally.  Gait and station: Gait is normal. Tandem gait is unsteady. Romberg is negative. No drift is seen.  Reflexes: Deep tendon reflexes are symmetric and normal bilaterally, with exception of possible slight increase in the left biceps reflex as compared to the right. Toes are downgoing bilaterally.   CTA head and neck 10/24/17:  IMPRESSION: 1. No emergent large vessel occlusion or high-grade intracranial stenosis. 2. Moderate stenosis at the origin of the left vertebral artery, which is otherwise normal. 3. Carotid bifurcation and aortic atherosclerosis (ICD10-I70.0) without hemodynamically significant stenosis.   MRI brain 10/24/17:  IMPRESSION: Limited  brain MRI demonstrating acute lacunar infarct of the posterior left corona radiata tracking toward the posterior left lentiform nuclei. No associated mass effect.  * MRI scan images were reviewed online. I agree with the written report.    Assessment/Plan:  1.  Chronic dizziness, vertigo  2.  Episodic left frontotemporal headache  3.  Cerebrovascular disease, recent stroke  4.  Bipolar disorder  5.  Peripheral vascular disease  The patient has chronic dizziness that occurs with sitting and standing only, but not with lying down.  She has never been tried on any medication for the dizziness, we will give a trial on low-dose meclizine taking 12.5 mg 3 times daily.  If this is helpful, we may consider a referral to vestibular rehabilitation.  The patient may be treated for migraine headaches in the future if the meclizine is not helpful.  She will follow-up in 4 months.  She does not clearly relate any medications to onset of dizziness.  Jill Alexanders MD 05/23/2018 3:38 PM  [facility] [address] [place], [place] [address]  Phone [phone] Fax [phone]

## 2018-05-29 DIAGNOSIS — F331 Major depressive disorder, recurrent, moderate: Secondary | ICD-10-CM | POA: Diagnosis not present

## 2018-05-30 ENCOUNTER — Other Ambulatory Visit: Payer: Self-pay

## 2018-05-30 ENCOUNTER — Ambulatory Visit: Payer: Medicare Other | Admitting: Family Medicine

## 2018-05-30 ENCOUNTER — Encounter: Payer: Self-pay | Admitting: Family Medicine

## 2018-05-30 ENCOUNTER — Ambulatory Visit (INDEPENDENT_AMBULATORY_CARE_PROVIDER_SITE_OTHER): Payer: Medicare Other | Admitting: Family Medicine

## 2018-05-30 VITALS — BP 140/79 | HR 83 | Temp 98.0°F | Ht 68.0 in

## 2018-05-30 DIAGNOSIS — I739 Peripheral vascular disease, unspecified: Secondary | ICD-10-CM | POA: Diagnosis not present

## 2018-05-30 DIAGNOSIS — S81802A Unspecified open wound, left lower leg, initial encounter: Secondary | ICD-10-CM

## 2018-05-30 DIAGNOSIS — L03116 Cellulitis of left lower limb: Secondary | ICD-10-CM

## 2018-05-30 DIAGNOSIS — I639 Cerebral infarction, unspecified: Secondary | ICD-10-CM

## 2018-05-30 MED ORDER — CEPHALEXIN 500 MG PO CAPS: 500.0000 mg | ORAL_CAPSULE | Freq: Two times a day (BID) | ORAL | 0 refills | Status: DC

## 2018-05-30 MED ORDER — MUPIROCIN 2 % EX OINT: 1.0000 "application " | TOPICAL_OINTMENT | Freq: Two times a day (BID) | CUTANEOUS | 0 refills | Status: DC

## 2018-05-30 NOTE — Patient Instructions (Addendum)
  If you have any further thoughts of suicide, be seen in emergency room or call 911.  Keep appointment with therapist as planned.   For wounds on your legs and cellulitis, start antibiotic pill twice per day, apply Bactroban ointment twice per day after cleansing with soap and water and keep area clean.  Recheck in the next 2 days with myself or other provider if needed.  I will request other vascular surgeon appointment.   Return to the clinic or go to the nearest emergency room if any of your symptoms worsen or new symptoms occur.  If you have lab work done today you will be contacted with your lab results within the next 2 weeks.  If you have not heard from [place] then please contact us. The fastest way to get your results is to register for My Chart.   IF you received an x-ray today, you will receive an invoice from [facility]. Please contact [facility] at [phone] with questions or concerns regarding your invoice.   IF you received labwork today, you will receive an invoice from [facility]. Please contact [facility] at [phone] with questions or concerns regarding your invoice.   Our billing staff will not be able to assist you with questions regarding bills from these companies.  You will be contacted with the lab results as soon as they are available. The fastest way to get your results is to activate your My Chart account. Instructions are located on the last page of this paperwork. If you have not heard from [place] regarding the results in 2 weeks, please contact this office.

## 2018-05-30 NOTE — Progress Notes (Signed)
Subjective:  By signing my name below, I, [name], attest that this documentation has been prepared under the direction and in the presence of Merri Ray, MD. Electronically Signed: [name], [name] 05/30/2018 , 4:51 PM .  Patient was seen in Room 1 .   Patient ID: [name], female    DOB: [birth year], 72 y.o.   MRN: [id number] Chief Complaint  Patient presents with  . Leg Pain    open wound on left leg  . PHQ9= 14   HPI [name] is a 72 y.o. female  Here for evaluation of leg pain. She had reported a history of peripheral artery disease, but had normal ABI's in March 2018. Thought to have Raynaud's, and smoking cessation was discussed. She was most recently seen by vascular surgeon, Dr. Oneida Alar, on July 5th. She did have abnormal left ABI with vascular claudication LLE. She was offered arteriogram of LLE with possible revascularization. Repeat ABI's in 3 months and continue aspirin and statin.   Patient states she prefers to see a different vascular surgeon, but didn't call to set up an appointment to see another provider in the same practice. She didn't discuss her concerns with Dr. Oneida Alar.   She reports she's had a wound present for about a month. She had stuck a band-aid over an ulcer and then pulled it off causing a new wound to skin. She's had drainage occasionally and has stopped using band-aids. She's been applying OTC neosporin over the area. She noticed redness around the areas 2 weeks ago that initially improved but now become more red yesterday. She denies any known MRSA infection. She is still smoking, about half ppd. She denies any known drug allergies.    Positive Depression screening Depression screen [facility] 2/9 05/30/2018 04/30/2018 03/12/2018 03/05/2018 02/19/2018  Decreased Interest 0 0 0 0 0  Down, Depressed, Hopeless 3 0 0 0 0  PHQ - 2 Score 3 0 0 0 0  Altered sleeping 2 - - - -  Tired, decreased energy 3 - - - -  Change in appetite 3 - - - -    Feeling bad or failure about yourself  2 - - - -  Trouble concentrating 0 - - - -  Moving slowly or fidgety/restless 0 - - - -  Suicidal thoughts 1 - - - -  PHQ-9 Score 14 - - - -  Difficult doing work/chores Somewhat difficult - - - -   She is treated with medication and psychiatry, Dr. Clovis Pu.   She had suicidal thoughts on occasion due to death of her sister, last thought about 2-3 weeks ago, denies recent thoughts.  She has discussed this issue with her therapist, Barron Schmid. She denies any plans with suicide. She saw Dr. Clovis Pu yesterday and will see her therapist, Barron Schmid tomorrow.    Patient Active Problem List   Diagnosis Date Noted  . PAD (peripheral artery disease) ([place]) 03/15/2018  . Atherosclerosis of native artery of left lower extremity with intermittent claudication ([place]) 03/15/2018  . Chronic left-sided low back pain with left-sided sciatica 12/25/2017  . Facial droop   . Hyperlipidemia   . CVA (cerebral vascular accident) ([place]) 10/23/2017  . Critical lower limb ischemia 11/08/2016  . Smoker 11/08/2016  . 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 Cigarette smoker 12/24/2015  . Hypothyroid ? 01/16/2014  . Mild cognitive impairment 01/16/2014  . Meningioma ([place]) 10/21/2013  . Chest pain 10/20/2013  . Medicare annual wellness visit, subsequent  06/16/2013  . Dizziness and giddiness 06/16/2013  . RLS (restless legs syndrome) 04/29/2012  . Abdominal pain 12/27/2010  . DEGENERATIVE JOINT DISEASE 09/23/2010  . CAROTID ARTERY DISEASE 08/15/2010  . CHEST PAIN 08/15/2010  . HEADACHE 12/02/2009  . BACK PAIN 11/22/2009  . CONSTIPATION, CHRONIC 01/29/2008  . NAUSEA 01/28/2008  . DEPRESSION 03/08/2007  . Essential hypertension 03/08/2007   Past Medical History:  Diagnosis Date  . Bipolar disorder ([place])   . CAD (coronary artery disease)    intra and extracranial vascular dx per MRI 4/11, neurology rec strict CVRF control  . Colonic inertia    . Constipation    chronic;severe  . Depression   . Duodenitis   . EKG abnormalities    changes, stress test neg (false EKG changes)  . Gastritis   . GERD (gastroesophageal reflux disease)   . Hypertension   . Hypothyroid 01/16/2014  . Meningioma ([place]) 10/21/2013  . Psoriasis    sees derm  . Stroke [facility])    Past Surgical History:  Procedure Laterality Date  . arthroscopy  04/2010   Right knee  . TUBAL LIGATION     No Known Allergies Prior to Admission medications   Medication Sig Start Date End Date Taking? Authorizing Provider  amLODipine (NORVASC) 10 MG tablet Take 1 tablet (10 mg total) by mouth daily. 10/25/17   Rai, Vernelle Emerald, MD  aspirin EC 325 MG EC tablet Take 1 tablet (325 mg total) by mouth daily. 10/25/17   Rai, Vernelle Emerald, MD  atorvastatin (LIPITOR) 40 MG tablet Take 1 tablet (40 mg total) by mouth at bedtime. 10/25/17   Rai, Ripudeep K, MD  bisacodyl (DULCOLAX) 5 MG EC tablet Take 10 mg by mouth at bedtime.     [provider]  CASCARA SAGRADA PO Take 2 capsules by mouth at bedtime.    [provider]  clonazePAM (KLONOPIN) 0.5 MG tablet Take 0.5 mg by mouth at bedtime.    [provider]  DULoxetine (CYMBALTA) 30 MG capsule Take 90 mg by mouth daily.    [provider]  famotidine (PEPCID) 20 MG tablet One at bedtime 02/14/16   Tanda Rockers, MD  lamoTRIgine (LAMICTAL) 100 MG tablet Take 150 mg by mouth 2 (two) times daily.  12/18/17   [provider]  linaclotide Rolan Lipa) 145 MCG CAPS capsule Take 1 capsule (145 mcg total) by mouth daily before breakfast. 11/28/17   Armbruster, Carlota Raspberry, MD  losartan (COZAAR) 50 MG tablet Take 1 tablet (50 mg total) by mouth daily. 10/25/17   Rai, Vernelle Emerald, MD  meclizine (ANTIVERT) 12.5 MG tablet Take 1 tablet (12.5 mg total) by mouth 3 (three) times daily. 05/23/18   Kathrynn Ducking, MD  polyethylene glycol [facility] / Floria Raveling) packet Take 34 g by mouth every other day.     [provider]  rOPINIRole (REQUIP) 2 MG tablet TAKE 1 TABLET BY MOUTH AT BEDTIME Patient taking differently: TAKE 2 mg TABLET BY MOUTH AT BEDTIME 05/27/12   Dunn, Areta Haber, PA-C  senna (SENOKOT) 8.6 MG TABS tablet Take 2 tablets by mouth at bedtime.     [provider]   Social History   Socioeconomic History  . Marital status: Married    Spouse name: Not on file  . Number of children: 1  . Years of education: Not on file  . Highest education level: Not on file  Occupational History  . Occupation: retired  Scientific laboratory technician  . Emergency planning/management officer  strain: Not on file  . Food insecurity:    Worry: Not on file    Inability: Not on file  . Transportation needs:    Medical: Not on file    Non-medical: Not on file  Tobacco Use  . Smoking status: Current Every Day Smoker    Packs/day: 1.00    Years: 60.00    Pack years: 60.00    Types: Cigarettes  . Smokeless tobacco: Never Used  Substance and Sexual Activity  . Alcohol use: Yes    Alcohol/week: 0.0 standard drinks    Comment: yes on occassion  . Drug use: No  . Sexual activity: Not on file  Lifestyle  . Physical activity:    Days per week: Not on file    Minutes per session: Not on file  . Stress: Not on file  Relationships  . Social connections:    Talks on phone: Not on file    Gets together: Not on file    Attends religious service: Not on file    Active member of club or organization: Not on file    Attends meetings of clubs or organizations: Not on file    Relationship status: Not on file  . Intimate partner violence:    Fear of current or ex partner: Not on file    Emotionally abused: Not on file    Physically abused: Not on file    Forced sexual activity: Not on file  Other Topics Concern  . Not on file  Social History Narrative   Brother in law Mr Rexford Maus (one of my patients)   Lives w/ husband       Review of Systems  Constitutional: Negative for fatigue and unexpected weight change.   Respiratory: Negative for chest tightness and shortness of breath.   Cardiovascular: Negative for chest pain, palpitations and leg swelling.  Gastrointestinal: Negative for abdominal pain and [name] in stool.  Skin: Positive for color change and wound.  Neurological: Negative for dizziness, syncope, light-headedness and headaches.       Objective:   Physical Exam  Constitutional: She is oriented to person, place, and time. She appears well-developed and well-nourished.  HENT:  Head: Normocephalic and atraumatic.  Eyes: Pupils are equal, round, and reactive to light. Conjunctivae and EOM are normal.  Neck: Carotid bruit is not present.  Cardiovascular: Normal rate, regular rhythm, normal heart sounds and intact distal pulses.  Pulmonary/Chest: Effort normal and breath sounds normal.  Abdominal: Soft. She exhibits no pulsatile midline mass. There is no tenderness.  Neurological: She is alert and oriented to person, place, and time.  Skin: Skin is warm and dry.  Left foot: difficulty obtaining DP pulse, there's some warmth with surrounding erythema of the 2 wounds on left lower leg; anterior lesion measures about 1.5 cm x 1 cm, and medial lesion measures about 5 mm x 1.5 cm with a shallow ulceration, slight contraction of surrounding skin  Psychiatric: She has a normal mood and affect. Her behavior is normal.  Vitals reviewed.   Vitals:   05/30/18 1547  BP: 140/79  Pulse: 83  Temp: 98 F (36.7 C)  TempSrc: Oral  SpO2: 98%  Height: 5\' 8"  (1.727 m)       Assessment & Plan:   DILPREET FAIRES is a 72 y.o. female Wound of left lower extremity, initial encounter - Plan: Ambulatory referral to Vascular Surgery, cephALEXin (KEFLEX) 500 MG capsule, mupirocin ointment (BACTROBAN) 2 %  Cellulitis of leg, left -  Plan: mupirocin ointment (BACTROBAN) 2 %  Vascular claudication ([place])   -History of vascular claudication, seen by vascular specialist as above. Requests second opinion,  referral placed.   -  Appears to have some element of cellulitis with possible toe ulcer versus wound from bandage.  Start Keflex 500 mg twice daily, potential side effects discussed, Bactroban ointment 3 times daily after cleansing with soap and water.  Recheck in 2 days  History of depression with prior suicidal ideation.  She is under care of psychiatrist and therapist and denies acute suicidal ideation, intent, or plan.  Agrees to acute treatment at ER or 911 if the symptoms recur.    Meds ordered this encounter  Medications  . cephALEXin (KEFLEX) 500 MG capsule    Sig: Take 1 capsule (500 mg total) by mouth 2 (two) times daily.    Dispense:  20 capsule    Refill:  0  . mupirocin ointment (BACTROBAN) 2 %    Sig: Apply 1 application topically 2 (two) times daily.    Dispense:  22 g    Refill:  0   Patient Instructions    If you have any further thoughts of suicide, be seen in emergency room or call 911.  Keep appointment with therapist as planned.   For wounds on your legs and cellulitis, start antibiotic pill twice per day, apply Bactroban ointment twice per day after cleansing with soap and water and keep area clean.  Recheck in the next 2 days with myself or other provider if needed.  I will request other vascular surgeon appointment.   Return to the clinic or go to the nearest emergency room if any of your symptoms worsen or new symptoms occur.  If you have lab work done today you will be contacted with your lab results within the next 2 weeks.  If you have not heard from [place] then please contact us. The fastest way to get your results is to register for My Chart.   IF you received an x-ray today, you will receive an invoice from [facility]. Please contact [facility] at [phone] with questions or concerns regarding your invoice.   IF you received labwork today, you will receive an invoice from [facility]. Please contact [facility] at [phone] with  questions or concerns regarding your invoice.   Our billing staff will not be able to assist you with questions regarding bills from these companies.  You will be contacted with the lab results as soon as they are available. The fastest way to get your results is to activate your My Chart account. Instructions are located on the last page of this paperwork. If you have not heard from [place] regarding the results in 2 weeks, please contact this office.       I personally performed the services described in this documentation, which was scribed in my presence. The recorded information has been reviewed and considered for accuracy and completeness, addended by me as needed, and agree with information above.  Signed,   Merri Ray, MD [facility].  06/02/18 5:24 PM

## 2018-05-31 ENCOUNTER — Other Ambulatory Visit: Payer: Self-pay

## 2018-05-31 DIAGNOSIS — I70212 Atherosclerosis of native arteries of extremities with intermittent claudication, left leg: Secondary | ICD-10-CM

## 2018-05-31 DIAGNOSIS — I739 Peripheral vascular disease, unspecified: Secondary | ICD-10-CM

## 2018-05-31 DIAGNOSIS — F331 Major depressive disorder, recurrent, moderate: Secondary | ICD-10-CM | POA: Diagnosis not present

## 2018-06-02 ENCOUNTER — Encounter: Payer: Self-pay | Admitting: Family Medicine

## 2018-06-20 ENCOUNTER — Encounter: Payer: Self-pay | Admitting: Family Medicine

## 2018-06-20 ENCOUNTER — Ambulatory Visit: Payer: Medicare Other | Admitting: Vascular Surgery

## 2018-06-20 ENCOUNTER — Inpatient Hospital Stay (HOSPITAL_COMMUNITY): Admission: RE | Admit: 2018-06-20 | Payer: Medicare Other | Source: Ambulatory Visit

## 2018-06-27 ENCOUNTER — Ambulatory Visit: Payer: Self-pay | Admitting: Psychiatry

## 2018-07-02 ENCOUNTER — Encounter: Payer: Self-pay | Admitting: Family Medicine

## 2018-07-02 DIAGNOSIS — M79605 Pain in left leg: Secondary | ICD-10-CM

## 2018-07-07 ENCOUNTER — Encounter: Payer: Self-pay | Admitting: Family Medicine

## 2018-07-08 ENCOUNTER — Inpatient Hospital Stay (HOSPITAL_COMMUNITY)
Admission: EM | Admit: 2018-07-08 | Discharge: 2018-07-16 | DRG: 357 | Disposition: A | Discharge status: Home Health | Payer: Medicare Other | Attending: Family Medicine | Admitting: Family Medicine

## 2018-07-08 ENCOUNTER — Emergency Department (HOSPITAL_COMMUNITY): Payer: Medicare Other

## 2018-07-08 ENCOUNTER — Ambulatory Visit: Payer: Self-pay

## 2018-07-08 ENCOUNTER — Encounter (HOSPITAL_COMMUNITY): Payer: Self-pay | Admitting: *Deleted

## 2018-07-08 DIAGNOSIS — K219 Gastro-esophageal reflux disease without esophagitis: Secondary | ICD-10-CM | POA: Diagnosis present

## 2018-07-08 DIAGNOSIS — M419 Scoliosis, unspecified: Secondary | ICD-10-CM | POA: Diagnosis present

## 2018-07-08 DIAGNOSIS — I119 Hypertensive heart disease without heart failure: Secondary | ICD-10-CM | POA: Diagnosis present

## 2018-07-08 DIAGNOSIS — Z8249 Family history of ischemic heart disease and other diseases of the circulatory system: Secondary | ICD-10-CM

## 2018-07-08 DIAGNOSIS — K581 Irritable bowel syndrome with constipation: Secondary | ICD-10-CM | POA: Diagnosis present

## 2018-07-08 DIAGNOSIS — N281 Cyst of kidney, acquired: Secondary | ICD-10-CM | POA: Diagnosis present

## 2018-07-08 DIAGNOSIS — I714 Abdominal aortic aneurysm, without rupture: Secondary | ICD-10-CM | POA: Diagnosis present

## 2018-07-08 DIAGNOSIS — Z8673 Personal history of transient ischemic attack (TIA), and cerebral infarction without residual deficits: Secondary | ICD-10-CM | POA: Diagnosis not present

## 2018-07-08 DIAGNOSIS — F319 Bipolar disorder, unspecified: Secondary | ICD-10-CM | POA: Diagnosis present

## 2018-07-08 DIAGNOSIS — R1031 Right lower quadrant pain: Secondary | ICD-10-CM | POA: Diagnosis not present

## 2018-07-08 DIAGNOSIS — J8410 Pulmonary fibrosis, unspecified: Secondary | ICD-10-CM | POA: Diagnosis not present

## 2018-07-08 DIAGNOSIS — F17210 Nicotine dependence, cigarettes, uncomplicated: Secondary | ICD-10-CM | POA: Diagnosis present

## 2018-07-08 DIAGNOSIS — Z801 Family history of malignant neoplasm of trachea, bronchus and lung: Secondary | ICD-10-CM

## 2018-07-08 DIAGNOSIS — R05 Cough: Secondary | ICD-10-CM | POA: Diagnosis not present

## 2018-07-08 DIAGNOSIS — Z7982 Long term (current) use of aspirin: Secondary | ICD-10-CM | POA: Diagnosis not present

## 2018-07-08 DIAGNOSIS — I700 Atherosclerosis of aorta: Secondary | ICD-10-CM | POA: Diagnosis present

## 2018-07-08 DIAGNOSIS — R079 Chest pain, unspecified: Secondary | ICD-10-CM | POA: Diagnosis present

## 2018-07-08 DIAGNOSIS — I739 Peripheral vascular disease, unspecified: Secondary | ICD-10-CM | POA: Diagnosis present

## 2018-07-08 DIAGNOSIS — E876 Hypokalemia: Secondary | ICD-10-CM | POA: Diagnosis present

## 2018-07-08 DIAGNOSIS — J84112 Idiopathic pulmonary fibrosis: Secondary | ICD-10-CM | POA: Diagnosis present

## 2018-07-08 DIAGNOSIS — E785 Hyperlipidemia, unspecified: Secondary | ICD-10-CM | POA: Diagnosis present

## 2018-07-08 DIAGNOSIS — E039 Hypothyroidism, unspecified: Secondary | ICD-10-CM | POA: Diagnosis present

## 2018-07-08 DIAGNOSIS — K3532 Acute appendicitis with perforation and localized peritonitis, without abscess: Secondary | ICD-10-CM

## 2018-07-08 DIAGNOSIS — K5790 Diverticulosis of intestine, part unspecified, without perforation or abscess without bleeding: Secondary | ICD-10-CM | POA: Diagnosis present

## 2018-07-08 DIAGNOSIS — G2581 Restless legs syndrome: Secondary | ICD-10-CM | POA: Diagnosis not present

## 2018-07-08 DIAGNOSIS — I2510 Atherosclerotic heart disease of native coronary artery without angina pectoris: Secondary | ICD-10-CM | POA: Diagnosis present

## 2018-07-08 DIAGNOSIS — F17200 Nicotine dependence, unspecified, uncomplicated: Secondary | ICD-10-CM | POA: Diagnosis not present

## 2018-07-08 DIAGNOSIS — Z811 Family history of alcohol abuse and dependence: Secondary | ICD-10-CM

## 2018-07-08 DIAGNOSIS — Z79899 Other long term (current) drug therapy: Secondary | ICD-10-CM

## 2018-07-08 DIAGNOSIS — Z8709 Personal history of other diseases of the respiratory system: Secondary | ICD-10-CM

## 2018-07-08 DIAGNOSIS — L409 Psoriasis, unspecified: Secondary | ICD-10-CM | POA: Diagnosis present

## 2018-07-08 DIAGNOSIS — E7800 Pure hypercholesterolemia, unspecified: Secondary | ICD-10-CM | POA: Diagnosis not present

## 2018-07-08 DIAGNOSIS — K921 Melena: Secondary | ICD-10-CM | POA: Diagnosis present

## 2018-07-08 DIAGNOSIS — Z86011 Personal history of benign neoplasm of the brain: Secondary | ICD-10-CM

## 2018-07-08 DIAGNOSIS — K3533 Acute appendicitis with perforation and localized peritonitis, with abscess: Principal | ICD-10-CM | POA: Diagnosis present

## 2018-07-08 DIAGNOSIS — K3580 Unspecified acute appendicitis: Secondary | ICD-10-CM | POA: Diagnosis not present

## 2018-07-08 DIAGNOSIS — Z803 Family history of malignant neoplasm of breast: Secondary | ICD-10-CM

## 2018-07-08 DIAGNOSIS — K5780 Diverticulitis of intestine, part unspecified, with perforation and abscess without bleeding: Secondary | ICD-10-CM | POA: Diagnosis not present

## 2018-07-08 DIAGNOSIS — Z833 Family history of diabetes mellitus: Secondary | ICD-10-CM

## 2018-07-08 DIAGNOSIS — L0291 Cutaneous abscess, unspecified: Secondary | ICD-10-CM

## 2018-07-08 LAB — CBC
HEMATOCRIT: 40.8 % (ref 36.0–46.0)
HEMOGLOBIN: 12.6 g/dL (ref 12.0–15.0)
MCH: 28.6 pg (ref 26.0–34.0)
MCHC: 30.9 g/dL (ref 30.0–36.0)
MCV: 92.7 fL (ref 80.0–100.0)
PLATELETS: 252 10*3/uL (ref 150–400)
RBC: 4.4 MIL/uL (ref 3.87–5.11)
RDW: 13.5 % (ref 11.5–15.5)
WBC: 11.5 10*3/uL — ABNORMAL HIGH (ref 4.0–10.5)
nRBC: 0 % (ref 0.0–0.2)

## 2018-07-08 LAB — URINALYSIS, ROUTINE W REFLEX MICROSCOPIC
Bacteria, UA: NONE SEEN
Bilirubin Urine: NEGATIVE
GLUCOSE, UA: NEGATIVE mg/dL
Ketones, ur: NEGATIVE mg/dL
Leukocytes, UA: NEGATIVE
Nitrite: NEGATIVE
PH: 7 (ref 5.0–8.0)
Protein, ur: NEGATIVE mg/dL
Specific Gravity, Urine: 1.025 (ref 1.005–1.030)

## 2018-07-08 LAB — COMPREHENSIVE METABOLIC PANEL
ALBUMIN: 3.6 g/dL (ref 3.5–5.0)
ALT: 11 U/L (ref 0–44)
AST: 15 U/L (ref 15–41)
Alkaline Phosphatase: 66 U/L (ref 38–126)
Anion gap: 7 (ref 5–15)
BUN: 8 mg/dL (ref 8–23)
CHLORIDE: 96 mmol/L — AB (ref 98–111)
CO2: 29 mmol/L (ref 22–32)
CREATININE: 0.81 mg/dL (ref 0.44–1.00)
Calcium: 8.8 mg/dL — ABNORMAL LOW (ref 8.9–10.3)
GFR calc Af Amer: >60 mL/min (ref >60)
GFR calc non Af Amer: >60 mL/min (ref >60)
GLUCOSE: 128 mg/dL — AB (ref 70–99)
POTASSIUM: 4 mmol/L (ref 3.5–5.1)
SODIUM: 132 mmol/L — AB (ref 135–145)
Total Bilirubin: 0.8 mg/dL (ref 0.3–1.2)
Total Protein: 7 g/dL (ref 6.5–8.1)

## 2018-07-08 LAB — LIPASE, BLOOD: LIPASE: 22 U/L (ref 11–51)

## 2018-07-08 MED ORDER — DEXTROSE-NACL 5-0.9 % IV SOLN
INTRAVENOUS | Status: DC
  Administered 2018-07-09 (×3): via INTRAVENOUS

## 2018-07-08 MED ORDER — SODIUM CHLORIDE 0.9 % IV SOLN
INTRAVENOUS | Status: DC
  Administered 2018-07-08: 20:00:00 via INTRAVENOUS

## 2018-07-08 MED ORDER — PIPERACILLIN-TAZOBACTAM 3.375 G IVPB
3.3750 g | Freq: Once | INTRAVENOUS | Status: AC
  Administered 2018-07-08: 3.375 g via INTRAVENOUS
  Filled 2018-07-08: qty 50

## 2018-07-08 MED ORDER — ONDANSETRON HCL 4 MG/2ML IJ SOLN
4.0000 mg | Freq: Four times a day (QID) | INTRAMUSCULAR | Status: DC | PRN
  Administered 2018-07-09 – 2018-07-12 (×2): 4 mg via INTRAVENOUS
  Filled 2018-07-08 (×3): qty 2

## 2018-07-08 MED ORDER — ONDANSETRON HCL 4 MG PO TABS: 4.0000 mg | ORAL_TABLET | Freq: Four times a day (QID) | ORAL | Status: DC | PRN

## 2018-07-08 MED ORDER — HYDROMORPHONE HCL 1 MG/ML IJ SOLN
0.5000 mg | Freq: Once | INTRAMUSCULAR | Status: AC
  Administered 2018-07-08: 0.5 mg via INTRAVENOUS
  Filled 2018-07-08: qty 1

## 2018-07-08 MED ORDER — IOHEXOL 300 MG/ML  SOLN
50.0000 mL | Freq: Once | INTRAMUSCULAR | Status: AC | PRN
  Administered 2018-07-08: 50 mL via INTRAVENOUS

## 2018-07-08 MED ORDER — DULOXETINE HCL 60 MG PO CPEP
60.0000 mg | ORAL_CAPSULE | ORAL | Status: DC
  Administered 2018-07-09 – 2018-07-16 (×8): 60 mg via ORAL
  Administered 2018-07-16: 30 mg via ORAL
  Filled 2018-07-08 (×13): qty 1

## 2018-07-08 MED ORDER — LAMOTRIGINE 100 MG PO TABS
100.0000 mg | ORAL_TABLET | Freq: Every day | ORAL | Status: DC
  Administered 2018-07-08 – 2018-07-16 (×9): 100 mg via ORAL
  Filled 2018-07-08 (×10): qty 1

## 2018-07-08 MED ORDER — ROPINIROLE HCL 1 MG PO TABS
2.0000 mg | ORAL_TABLET | Freq: Every day | ORAL | Status: DC
  Administered 2018-07-08 – 2018-07-15 (×8): 2 mg via ORAL
  Filled 2018-07-08 (×4): qty 2
  Filled 2018-07-08: qty 4
  Filled 2018-07-08 (×3): qty 2

## 2018-07-08 MED ORDER — AMLODIPINE BESYLATE 10 MG PO TABS
10.0000 mg | ORAL_TABLET | Freq: Every day | ORAL | Status: DC
  Administered 2018-07-08 – 2018-07-16 (×8): 10 mg via ORAL
  Filled 2018-07-08 (×8): qty 1

## 2018-07-08 MED ORDER — CLONAZEPAM 0.5 MG PO TABS
0.5000 mg | ORAL_TABLET | Freq: Every day | ORAL | Status: DC
  Administered 2018-07-08 – 2018-07-15 (×8): 0.5 mg via ORAL
  Filled 2018-07-08 (×9): qty 1

## 2018-07-08 MED ORDER — SODIUM CHLORIDE 0.9 % IV BOLUS
500.0000 mL | Freq: Once | INTRAVENOUS | Status: AC
  Administered 2018-07-08: 500 mL via INTRAVENOUS

## 2018-07-08 MED ORDER — ONDANSETRON HCL 4 MG/2ML IJ SOLN
4.0000 mg | Freq: Once | INTRAMUSCULAR | Status: AC
  Administered 2018-07-08: 4 mg via INTRAVENOUS
  Filled 2018-07-08: qty 2

## 2018-07-08 MED ORDER — DULOXETINE HCL 30 MG PO CPEP
30.0000 mg | ORAL_CAPSULE | Freq: Every day | ORAL | Status: DC
  Administered 2018-07-08 – 2018-07-15 (×8): 30 mg via ORAL
  Filled 2018-07-08 (×8): qty 1

## 2018-07-08 NOTE — ED Provider Notes (Signed)
Patient placed in Quick Look pathway, seen and evaluated   Chief Complaint: RLQ pain  HPI:   Patient reports RLQ pain for two weeks.  She has been nauseous with out vomiting.  Her pain improved slightly when she has a bowel movement, however that only last for about 10 minutes.  Never had any abdominal surgeries before.  ROS: No fevers at home (one)  Physical Exam:   Gen: No distress  Neuro: Awake and Alert  Skin: Warm    Focused Exam: Hyperactive bowel sounds in all 4 quadrants.  There is mild tenderness to palpation in the right lower quadrant with guarding and peritoneal signs.  Outpatient and left lower quadrant and suprapubic area worse in her right lower quadrant pain.   Initiation of care has begun. The patient has been counseled on the process, plan, and necessity for staying for the completion/evaluation, and the remainder of the medical screening examination    Ollen Gross 07/08/18 [place], [place], DO 07/08/18 1536

## 2018-07-08 NOTE — Consult Note (Addendum)
CC: Perforated appendicitis, consult by Alecia Lemming, PA-C  HPI: [name] is an 72 y.o. 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 She has chronic abdominal issues related to constipation vs inertia. Pain never resolved; did progress over last 7 days. Denies n/v. Baseline issues with constipation but states she had BM this morning. Has had a one week hx of black and tarry stool. She had a low grade temp at home of 100F. Never had this prior.  Last colonoscopy was 2009 and proximal extent was hepatic flexure; subsequent barium enema was unsuccessful. She was scheduled for virtual colonoscopy but did not have it done    Past Medical History:  Diagnosis Date  . B[place] disorder ([place]   . CAD (coronary artery disease)    intra and extracranial vascular dx per MRI 4/11, neurology rec strict CVRF control  . Colonic inertia   . Constipation    chronic;severe  . Depression   . Duodenitis   . EKG abnormalities    changes, stress test neg (false EKG changes)  . Gastritis   . GERD (gastroesophageal reflux disease)   . Hypertension   . Hypothyroid 01/16/2014  . Meningioma ([facility] 10/21/2013  . Psoriasis    sees derm  . St[facility] ([facility]     Past Surgical History:  Procedure Laterality Date  . arthroscopy  04/2010   Right knee  . TUBAL LIGATION      Family History  Problem Relation Age of Onset  . Breast cancer Mother        metastisis to bones  . Diabetes Son   . Heart disease Unknown        grandfather   . Alcohol abuse Brother   . Heart disease Brother   . Heart disease Maternal Aunt   . Lung cancer Brother        smoked  . Colon cancer Neg Hx     Social:  reports that she has been smoking cigarettes. She has a 60.00 pack-year smoking history. She has never used smokeless tobacco. She reports that she drinks alcohol. She reports that she does  not use drugs.  Allergies: No Known Allergies  Medications: I have reviewed the patient's current medications.  Results for orders placed or performed during the hospital encounter of 07/08/18 (from the past 48 hour(s))  Lipase, blood     Status: None   Collection Time: 07/08/18  1:44 PM  Result Value Ref Range   Lipase 22 11 - 51 U/L    Comment: [facility], [place] [id number] Comprehensive metabolic panel     Status: Abnormal   Collection Time: 07/08/18  1:44 PM  Result Value Ref Range   Sodium 132 (L) 135 - 145 mmol/L   Potassium 4.0 3.5 - 5.1 mmol/L   Chloride 96 (L) 98 - 111 mmol/L   CO2 29 22 - 32 mmol/L   Glucose, Bld 128 (H) 70 - 99 mg/dL   BUN 8 8 - 23 mg/dL   Creatinine, Ser 0.81 0.44 - 1.00 mg/dL   Calcium 8.8 (L) 8.9 - 10.3 mg/dL   Total Protein 7.0 6.5 - 8.1 g/dL   Albumin 3.6 3.5 - 5.0 g/dL   AST 15 15 - 41 U/L   ALT 11 0 - 44 U/L   Alkaline Phosphatase 66 38 -  126 U/L   Total Bilirubin 0.8 0.3 - 1.2 mg/dL   GFR calc non Af Amer >60 >60 mL/min   GFR calc Af Amer >60 >60 mL/min    Comment: (NOTE) The eGFR has been calculated using the CKD EPI equation. This calculation has not been validated in all clinical situations. eGFR's persistently <60 mL/min signify possible Chronic Kidney Disease.    Anion gap 7 5 - 15    Comment: Performed at [facility] [address], [place], [place] [address]  CBC     Status: Abnormal   Collection Time: 07/08/18  1:44 PM  Result Value Ref Range   WBC 11.5 (H) 4.0 - 10.5 K/uL   RBC 4.40 3.87 - 5.11 MIL/uL   Hemoglobin 12.6 12.0 - 15.0 g/dL   HCT 40.8 36.0 - 46.0 %   MCV 92.7 80.0 - 100.0 fL   MCH 28.6 26.0 - 34.0 pg   MCHC 30.9 30.0 - 36.0 g/dL   RDW 13.5 11.5 - 15.5 %   Platelets 252 150 - 400 K/uL   nRBC 0.0 0.0 - 0.2 %    Comment: Performed at [facility], [place] [address] [place], [place] [address]    Dg Chest 2 View  Result Date: 07/08/2018 CLINICAL  DATA:  Cough for a year. History of chronic interstitial lung disease bronchitis. EXAM: CHEST - 2 VIEW COMPARISON:  Chest radiograph October 24, 2017 and CT chest Jan 20, 2016. FINDINGS: Similar chronic interstitial changes confluent in lung bases. No pleural effusion or focal consolidation. Cardiomediastinal silhouette is normal. Calcified aortic arch. No pneumothorax. Soft tissue planes and included osseous structures are non suspicious. Moderate degenerative change of the thoracic spine. IMPRESSION: Chronic interstitial changes without focal consolidation. Aortic Atherosclerosis (ICD10-I70.0). Electronically Signed   By: Elon Alas M.D.   On: 07/08/2018 15:03   Ct Abdomen Pelvis W Contrast  Result Date: 07/08/2018 CLINICAL DATA:  Progressive right lower quadrant abdominal pain with fever and chills. EXAM: CT ABDOMEN AND PELVIS WITH CONTRAST TECHNIQUE: Multidetector CT imaging of the abdomen and pelvis was performed using the standard protocol following bolus administration of intravenous contrast. CONTRAST:  1m OMNIPAQUE IOHEXOL 300 MG/ML SOLN, 561mOMNIPAQUE IOHEXOL 300 MG/ML SOLN COMPARISON:  Multiple exams, including 11/05/2009 FINDINGS: Lower chest: Mild peripheral interstitial accentuation in the lung bases favoring the lower lobes, new compared to 11/05/2009 and mildly progressive compared to 01/20/2016, with subpleural reticulation suggesting UIP. Descending thoracic aortic atherosclerotic calcification. Right coronary artery atherosclerotic calcification. Hepatobiliary: Unremarkable Pancreas: Unremarkable Spleen: Unremarkable Adrenals/Urinary Tract: Left kidney lower pole benign cyst. Hypodense lesions in the right kidney are technically too small to characterize although statistically likely to be cysts. Stomach/Bowel: A tubular structure below the cecum separate from the terminal ileum and likely representing inflamed appendix measures up to 1.5 cm in diameter. There is an adjacent  collection of gas and fluid measuring 3.4 by 1.9 by 2.6 cm compatible with periappendiceal abscess. The extraluminal gas appears contained and I do not see any other extraluminal gas or truly free air. Vascular/Lymphatic: Infrarenal abdominal aortic aneurysm 3.1 cm transverse on image 83/6. Aortoiliac atherosclerotic vascular disease. Reproductive: Unremarkable Other: No supplemental non-categorized findings. Musculoskeletal: Lumbar scoliosis, spondylosis, and degenerative disc disease. IMPRESSION: 1. Acute appendicitis with periappendiceal stranding and a periappendiceal abscess containing gas and fluid measuring up to 3.4 cm in diameter, favoring early rupture. Aside from this abscess there is no other extraluminal gas. 2. 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  3. Infrarenal abdominal aortic aneurysm 3.1 cm in diameter. Recommend followup by [place] in 3 years. This recommendation follows ACR consensus guidelines: Alisse Tuite Paper of the ACR Incidental Findings Committee II on Vascular Findings. J Am Coll Radiol 2013; 70:623-762 4. Other imaging findings of potential clinical significance: Aortic Atherosclerosis (ICD10-I70.0). Lumbar scoliosis, spondylosis, and degenerative disc disease. Right coronary artery atherosclerosis. Electronically Signed   By: Van Clines M.D.   On: 07/08/2018 16:54    ROS - all of the below systems have been reviewed with the patient and positives are indicated with bold text General: chills, fever or night sweats Eyes: blurry vision or double vision ENT: epistaxis or sore throat Allergy/Immunology: itchy/watery eyes or nasal congestion Hematologic/Lymphatic: bleeding problems, blood clots or swollen lymph nodes Endocrine: temperature intolerance or unexpected weight changes Breast: new or changing breast lumps or nipple discharge Resp: cough, shortness of breath, or wheezing CV: chest pain or dyspnea on exertion GI: as per HPI GU: dysuria,  trouble voiding, or hematuria MSK: joint pain or joint stiffness Neuro: TIA or stroke symptoms Derm: pruritus and skin lesion changes Psych: anxiety and depression  PE Blood pressure (!) 162/75, pulse 78, temperature 98.1 F (36.7 C), temperature source Oral, resp. rate 16, SpO2 98 %. Constitutional: NAD; conversant; no deformities Eyes: Moist conjunctiva; no lid lag; anicteric; PERRL Neck: Trachea midline; no thyromegaly Lungs: Normal respiratory effort; no tactile fremitus CV: RRR; no palpable thrills; no pitting edema GI: Abd soft, focally ttp in RLQ; focal guarding in this location as well. No tenderness in RUQ, LUQ or LLQ. No rebound/guarding. No palpable hepatosplenomegaly MSK: Normal gait; no clubbing/cyanosis Psychiatric: Appropriate affect; alert and oriented x3 Lymphatic: No palpable cervical or axillary lymphadenopathy  Results for orders placed or performed during the hospital encounter of 07/08/18 (from the past 48 hour(s))  Lipase, blood     Status: None   Collection Time: 07/08/18  1:44 PM  Result Value Ref Range   Lipase 22 11 - 51 U/L    Comment: Performed at [facility], [place] [address], [place], [place] [address]  Comprehensive metabolic panel     Status: Abnormal   Collection Time: 07/08/18  1:44 PM  Result Value Ref Range   Sodium 132 (L) 135 - 145 mmol/L   Potassium 4.0 3.5 - 5.1 mmol/L   Chloride 96 (L) 98 - 111 mmol/L   CO2 29 22 - 32 mmol/L   Glucose, Bld 128 (H) 70 - 99 mg/dL   BUN 8 8 - 23 mg/dL   Creatinine, Ser 0.81 0.44 - 1.00 mg/dL   Calcium 8.8 (L) 8.9 - 10.3 mg/dL   Total Protein 7.0 6.5 - 8.1 g/dL   Albumin 3.6 3.5 - 5.0 g/dL   AST 15 15 - 41 U/L   ALT 11 0 - 44 U/L   Alkaline Phosphatase 66 38 - 126 U/L   Total Bilirubin 0.8 0.3 - 1.2 mg/dL   GFR calc non Af Amer >60 >60 mL/min   GFR calc Af Amer >60 >60 mL/min    Comment: (NOTE) The eGFR has been calculated using the CKD EPI equation. This calculation has not been validated in  all clinical situations. eGFR's persistently <60 mL/min signify possible Chronic Kidney Disease.    Anion gap 7 5 - 15    Comment: Performed at [facility] [address] [place], [place] [address]  CBC     Status: Abnormal   Collection Time: 07/08/18  1:44 PM  Result  Value Ref Range   WBC 11.5 (H) 4.0 - 10.5 K/uL   RBC 4.40 3.87 - 5.11 MIL/uL   Hemoglobin 12.6 12.0 - 15.0 g/dL   HCT 40.8 36.0 - 46.0 %   MCV 92.7 80.0 - 100.0 fL   MCH 28.6 26.0 - 34.0 pg   MCHC 30.9 30.0 - 36.0 g/dL   RDW 13.5 11.5 - 15.5 %   Platelets 252 150 - 400 K/uL   nRBC 0.0 0.0 - 0.2 %    Comment: Performed at [facility], [place] [address], [place], [place] [address]    Dg Chest 2 View  Result Date: 07/08/2018 CLINICAL DATA:  Cough for a year. History of chronic interstitial lung disease bronchitis. EXAM: CHEST - 2 VIEW COMPARISON:  Chest radiograph October 24, 2017 and CT chest Jan 20, 2016. FINDINGS: Similar chronic interstitial changes confluent in lung bases. No pleural effusion or focal consolidation. Cardiomediastinal silhouette is normal. Calcified aortic arch. No pneumothorax. Soft tissue planes and included osseous structures are non suspicious. Moderate degenerative change of the thoracic spine. IMPRESSION: Chronic interstitial changes without focal consolidation. Aortic Atherosclerosis (ICD10-I70.0). Electronically Signed   By: Elon Alas M.D.   On: 07/08/2018 15:03   Ct Abdomen Pelvis W Contrast  Result Date: 07/08/2018 CLINICAL DATA:  Progressive right lower quadrant abdominal pain with fever and chills. EXAM: CT ABDOMEN AND PELVIS WITH CONTRAST TECHNIQUE: Multidetector CT imaging of the abdomen and pelvis was performed using the standard protocol following bolus administration of intravenous contrast. CONTRAST:  80m OMNIPAQUE IOHEXOL 300 MG/ML SOLN, 5375mOMNIPAQUE IOHEXOL 300 MG/ML SOLN COMPARISON:  Multiple exams, including 11/05/2009 FINDINGS: Lower chest: Mild peripheral  interstitial accentuation in the lung bases favoring the lower lobes, new compared to 11/05/2009 and mildly progressive compared to 01/20/2016, with subpleural reticulation suggesting UIP. Descending thoracic aortic atherosclerotic calcification. Right coronary artery atherosclerotic calcification. Hepatobiliary: Unremarkable Pancreas: Unremarkable Spleen: Unremarkable Adrenals/Urinary Tract: Left kidney lower pole benign cyst. Hypodense lesions in the right kidney are technically too small to characterize although statistically likely to be cysts. Stomach/Bowel: A tubular structure below the cecum separate from the terminal ileum and likely representing inflamed appendix measures up to 1.5 cm in diameter. There is an adjacent collection of gas and fluid measuring 3.4 by 1.9 by 2.6 cm compatible with periappendiceal abscess. The extraluminal gas appears contained and I do not see any other extraluminal gas or truly free air. Vascular/Lymphatic: Infrarenal abdominal aortic aneurysm 3.1 cm transverse on image 83/6. Aortoiliac atherosclerotic vascular disease. Reproductive: Unremarkable Other: No supplemental non-categorized findings. Musculoskeletal: Lumbar scoliosis, spondylosis, and degenerative disc disease. IMPRESSION: 1. Acute appendicitis with periappendiceal stranding and a periappendiceal abscess containing gas and fluid measuring up to 3.4 cm in diameter, favoring early rupture. Aside from this abscess there is no other extraluminal gas. 2. 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 3. Infrarenal abdominal aortic aneurysm 3.1 cm in diameter. Recommend followup by [place] 3 years. This recommendation follows ACR consensus guidelines: Daiquan Resnik Paper of the ACR Incidental Findings Committee II on Vascular Findings. J Am Coll Radiol 2013; 1091:638-466. Other imaging findings of potential clinical significance: Aortic Atherosclerosis (ICD10-I70.0). Lumbar scoliosis, spondylosis, and  degenerative disc disease. Right coronary artery atherosclerosis. Electronically Signed   By: WaVan Clines.D.   On: 07/08/2018 16:54    A/P: CaMARTINA BRODBECKs an [age].o. 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  -NPO, MIVF, daily CBC; will also check CEA -We discussed she will need workup by GI for her black tarry stool - possible EGD; ultimately will also need another colonoscopy attempt but would wait 8wks out from resolution of her perforated appendicitis; given black tarry stool possibility of malignancy also on the list -The anatomy and physiology of the GI tract was discussed at length with the patient. The pathophysiology of appendicitis was discussed at length as well. -We discussed options moving forward for treatment. Given that her appendix has perforated and has an associated abscess, we discussed role for IV abx and potentially percutaneous drainage if candidate. We discussed role for surgery being significant worsening of her condition but higher probability of ileocecectomy in these scenarios vs appendectomy alone, wound infection, anastomotic complications, leaks, and stomas. The patient's questions were answered to her satisfaction, she voiced understanding and agreement with treatment plan.  Sharon Mt. Dema Severin, M.D. [facility]

## 2018-07-08 NOTE — Telephone Encounter (Signed)
Pt is being evaluated in the ER.

## 2018-07-08 NOTE — ED Notes (Signed)
Pt reports dark, tarry stools x1 week.

## 2018-07-08 NOTE — ED Provider Notes (Signed)
[facility] EMERGENCY DEPARTMENT Provider Note   CSN: [id number] Arrival date & time: 07/08/18  1334     History   Chief Complaint Chief Complaint  Patient presents with  . Abdominal Pain    HPI [name] is a 72 y.o. female.  Patient with history of hypertension, smoking, peripheral vascular disease, known coronary artery disease, previous stroke --presents the emergency department with 2 weeks of progressive right lower quadrant abdominal pain.  Pain has progressively worsened.  Patient has had chills at home and fevers to 100 F.  She initially thought that the pain was "just gas".  She has had decreased appetite and nausea without vomiting.  She has a history of chronic constipation.  No blood noted in the stool.  No urinary symptoms.  No treatments prior to arrival.  No previous abdominal surgeries.  Last ate yesterday, she has had a few sips of water during the day today. The onset of this condition was acute. The course is worsening. Aggravating factors: Movement and palpation. Alleviating factors: none.       Past Medical History:  Diagnosis Date  . Bipolar disorder ([place])   . CAD (coronary artery disease)    intra and extracranial vascular dx per MRI 4/11, neurology rec strict CVRF control  . Colonic inertia   . Constipation    chronic;severe  . Depression   . Duodenitis   . EKG abnormalities    changes, stress test neg (false EKG changes)  . Gastritis   . GERD (gastroesophageal reflux disease)   . Hypertension   . Hypothyroid 01/16/2014  . Meningioma ([place]) 10/21/2013  . Psoriasis    sees derm  . Stroke [facility])     Patient Active Problem List   Diagnosis Date Noted  . PAD (peripheral artery disease) ([place]) 03/15/2018  . Atherosclerosis of native artery of left lower extremity with intermittent claudication ([place]) 03/15/2018  . Chronic left-sided low back pain with left-sided sciatica 12/25/2017  . Facial droop   . Hyperlipidemia   . CVA  (cerebral vascular accident) ([place]) 10/23/2017  . Critical lower limb ischemia 11/08/2016  . Smoker 11/08/2016  . 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 Cigarette smoker 12/24/2015  . Hypothyroid ? 01/16/2014  . Mild cognitive impairment 01/16/2014  . Meningioma ([place]) 10/21/2013  . Chest pain 10/20/2013  . Medicare annual wellness visit, subsequent 06/16/2013  . Dizziness and giddiness 06/16/2013  . RLS (restless legs syndrome) 04/29/2012  . Abdominal pain 12/27/2010  . DEGENERATIVE JOINT DISEASE 09/23/2010  . CAROTID ARTERY DISEASE 08/15/2010  . CHEST PAIN 08/15/2010  . HEADACHE 12/02/2009  . BACK PAIN 11/22/2009  . CONSTIPATION, CHRONIC 01/29/2008  . NAUSEA 01/28/2008  . DEPRESSION 03/08/2007  . Essential hypertension 03/08/2007    Past Surgical History:  Procedure Laterality Date  . arthroscopy  04/2010   Right knee  . TUBAL LIGATION       OB History   None      Home Medications    Prior to Admission medications   Medication Sig Start Date End Date Taking? Authorizing Provider  amLODipine (NORVASC) 10 MG tablet Take 1 tablet (10 mg total) by mouth daily. 10/25/17   Rai, Vernelle Emerald, MD  aspirin EC 325 MG EC tablet Take 1 tablet (325 mg total) by mouth daily. 10/25/17   Rai, Vernelle Emerald, MD  atorvastatin (LIPITOR) 40 MG tablet Take 1 tablet (40 mg total) by mouth at bedtime. 10/25/17   Mendel Corning, MD  bisacodyl (  DULCOLAX) 5 MG EC tablet Take 10 mg by mouth at bedtime.     [provider]  CASCARA SAGRADA PO Take 2 capsules by mouth at bedtime.    [provider]  cephALEXin (KEFLEX) 500 MG capsule Take 1 capsule (500 mg total) by mouth 2 (two) times daily. 05/30/18   [name], MD  clonazePAM (KLONOPIN) 0.5 MG tablet Take 0.5 mg by mouth at bedtime.    [provider]  DULoxetine (CYMBALTA) 30 MG capsule Take 90 mg by mouth daily.    [provider]  famotidine (PEPCID) 20 MG tablet One at bedtime 02/14/16    Tanda Rockers, MD  lamoTRIgine (LAMICTAL) 100 MG tablet Take 150 mg by mouth 2 (two) times daily.  12/18/17   [provider]  linaclotide Rolan Lipa) 145 MCG CAPS capsule Take 1 capsule (145 mcg total) by mouth daily before breakfast. 11/28/17   Armbruster, Carlota Raspberry, MD  losartan (COZAAR) 50 MG tablet Take 1 tablet (50 mg total) by mouth daily. 10/25/17   Rai, Vernelle Emerald, MD  meclizine (ANTIVERT) 12.5 MG tablet Take 1 tablet (12.5 mg total) by mouth 3 (three) times daily. 05/23/18   Kathrynn Ducking, MD  mupirocin ointment (BACTROBAN) 2 % Apply 1 application topically 2 (two) times daily. 05/30/18   [name], MD  polyethylene glycol [facility] / Floria Raveling) packet Take 34 g by mouth every other day.     [provider]  rOPINIRole (REQUIP) 2 MG tablet TAKE 1 TABLET BY MOUTH AT BEDTIME Patient taking differently: TAKE 2 mg TABLET BY MOUTH AT BEDTIME 05/27/12   Dunn, Areta Haber, PA-C  senna (SENOKOT) 8.6 MG TABS tablet Take 2 tablets by mouth at bedtime.     [provider]    Family History Family History  Problem Relation Age of Onset  . Breast cancer Mother        metastisis to bones  . Diabetes Son   . Heart disease Unknown        grandfather   . Alcohol abuse Brother   . Heart disease Brother   . Heart disease Maternal Aunt   . Lung cancer Brother        smoked  . Colon cancer Neg Hx     Social History Social History   Tobacco Use  . Smoking status: Current Every Day Smoker    Packs/day: 1.00    Years: 60.00    Pack years: 60.00    Types: Cigarettes  . Smokeless tobacco: Never Used  Substance Use Topics  . Alcohol use: Yes    Alcohol/week: 0.0 standard drinks    Comment: yes on occassion  . Drug use: No     Allergies   Patient has no known allergies.   Review of Systems Review of Systems  Constitutional: Positive for appetite change, chills and fever. Negative for unexpected weight change.  HENT: Negative for rhinorrhea and sore throat.     Eyes: Negative for redness.  Respiratory: Negative for cough.   Cardiovascular: Negative for chest pain.  Gastrointestinal: Positive for abdominal pain and nausea. Negative for diarrhea and vomiting.  Genitourinary: Negative for dysuria.  Musculoskeletal: Negative for myalgias.  Skin: Positive for wound (chronic wounds on left foot). Negative for rash.  Neurological: Negative for headaches.     Physical Exam Updated Vital Signs BP (!) 162/75 (BP Location: Left Arm)   Pulse 78   Temp 98.1 F (36.7 C) (Oral)   Resp 16  SpO2 98%   Physical Exam  Constitutional: She appears well-developed and well-nourished.  HENT:  Head: Normocephalic and atraumatic.  Eyes: Conjunctivae are normal. Right eye exhibits no discharge. Left eye exhibits no discharge.  Neck: Normal range of motion. Neck supple.  Cardiovascular: Normal rate, regular rhythm and normal heart sounds.  Pulmonary/Chest: Effort normal and breath sounds normal.  Abdominal: Soft. There is tenderness (Moderate to severe) in the right lower quadrant, periumbilical area and suprapubic area. There is guarding.  Neurological: She is alert.  Skin: Skin is warm and dry.  Psychiatric: She has a normal mood and affect.  Nursing note and vitals reviewed.    ED Treatments / Results  Labs (all labs ordered are listed, but only abnormal results are displayed) Labs Reviewed  COMPREHENSIVE METABOLIC PANEL - Abnormal; Notable for the following components:      Result Value   Sodium 132 (*)    Chloride 96 (*)    Glucose, Bld 128 (*)    Calcium 8.8 (*)    All other components within normal limits  CBC - Abnormal; Notable for the following components:   WBC 11.5 (*)    All other components within normal limits  URINE CULTURE  LIPASE, BLOOD  URINALYSIS, ROUTINE W REFLEX MICROSCOPIC    EKG None  Radiology Dg Chest 2 View  Result Date: 07/08/2018 CLINICAL DATA:  Cough for a year. History of chronic interstitial lung disease  bronchitis. EXAM: CHEST - 2 VIEW COMPARISON:  Chest radiograph October 24, 2017 and CT chest Jan 20, 2016. FINDINGS: Similar chronic interstitial changes confluent in lung bases. No pleural effusion or focal consolidation. Cardiomediastinal silhouette is normal. Calcified aortic arch. No pneumothorax. Soft tissue planes and included osseous structures are non suspicious. Moderate degenerative change of the thoracic spine. IMPRESSION: Chronic interstitial changes without focal consolidation. Aortic Atherosclerosis (ICD10-I70.0). Electronically Signed   By: Elon Alas M.D.   On: 07/08/2018 15:03   Ct Abdomen Pelvis W Contrast  Result Date: 07/08/2018 CLINICAL DATA:  Progressive right lower quadrant abdominal pain with fever and chills. EXAM: CT ABDOMEN AND PELVIS WITH CONTRAST TECHNIQUE: Multidetector CT imaging of the abdomen and pelvis was performed using the standard protocol following bolus administration of intravenous contrast. CONTRAST:  41mL OMNIPAQUE IOHEXOL 300 MG/ML SOLN, 68mL OMNIPAQUE IOHEXOL 300 MG/ML SOLN COMPARISON:  Multiple exams, including 11/05/2009 FINDINGS: Lower chest: Mild peripheral interstitial accentuation in the lung bases favoring the lower lobes, new compared to 11/05/2009 and mildly progressive compared to 01/20/2016, with subpleural reticulation suggesting UIP. Descending thoracic aortic atherosclerotic calcification. Right coronary artery atherosclerotic calcification. Hepatobiliary: Unremarkable Pancreas: Unremarkable Spleen: Unremarkable Adrenals/Urinary Tract: Left kidney lower pole benign cyst. Hypodense lesions in the right kidney are technically too small to characterize although statistically likely to be cysts. Stomach/Bowel: A tubular structure below the cecum separate from the terminal ileum and likely representing inflamed appendix measures up to 1.5 cm in diameter. There is an adjacent collection of gas and fluid measuring 3.4 by 1.9 by 2.6 cm compatible with  periappendiceal abscess. The extraluminal gas appears contained and I do not see any other extraluminal gas or truly free air. Vascular/Lymphatic: Infrarenal abdominal aortic aneurysm 3.1 cm transverse on image 83/6. Aortoiliac atherosclerotic vascular disease. Reproductive: Unremarkable Other: No supplemental non-categorized findings. Musculoskeletal: Lumbar scoliosis, spondylosis, and degenerative disc disease. IMPRESSION: 1. Acute appendicitis with periappendiceal stranding and a periappendiceal abscess containing gas and fluid measuring up to 3.4 cm in diameter, favoring early rupture. Aside from this abscess there is no  other extraluminal gas. 2. 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 3. Infrarenal abdominal aortic aneurysm 3.1 cm in diameter. Recommend followup by [place] in 3 years. This recommendation follows ACR consensus guidelines: White Paper of the ACR Incidental Findings Committee II on Vascular Findings. J Am Coll Radiol 2013; 89:211-941 4. Other imaging findings of potential clinical significance: Aortic Atherosclerosis (ICD10-I70.0). Lumbar scoliosis, spondylosis, and degenerative disc disease. Right coronary artery atherosclerosis. Electronically Signed   By: Van Clines M.D.   On: 07/08/2018 16:54    Procedures Procedures (including critical care time)  Medications Ordered in ED Medications  piperacillin-tazobactam (ZOSYN) IVPB 3.375 g (3.375 g Intravenous New Bag/Given 07/08/18 1824)  sodium chloride 0.9 % bolus 500 mL (500 mLs Intravenous New Bag/Given 07/08/18 1826)  0.9 %  sodium chloride infusion (has no administration in time range)  iohexol (OMNIPAQUE) 300 MG/ML solution 50 mL (50 mLs Intravenous Contrast Given 07/08/18 1630)  iohexol (OMNIPAQUE) 300 MG/ML solution 50 mL (50 mLs Intravenous Contrast Given 07/08/18 1630)  HYDROmorphone (DILAUDID) injection 0.5 mg (0.5 mg Intravenous Given 07/08/18 1815)  ondansetron (ZOFRAN) injection 4 mg (4 mg  Intravenous Given 07/08/18 1815)     Initial Impression / Assessment and Plan / ED Course  I have reviewed the triage vital signs and the nursing notes.  Pertinent labs & imaging results that were available during my care of the patient were reviewed by me and considered in my medical decision making (see chart for details).     Patient seen and examined. Work-up initiated. Medications ordered.   Vital signs reviewed and are as follows: BP (!) 162/75 (BP Location: Left Arm)   Pulse 78   Temp 98.1 F (36.7 C) (Oral)   Resp 16   SpO2 98%   Spoke with Dr. Dema Severin.  Surgery to see patient.  Given comorbidities, will admit to hospitalist service.  6:50 PM Spoke with Dr. Jonelle Sidle who will see.   Final Clinical Impressions(s) / ED Diagnoses   Final diagnoses:  Acute appendicitis with appendiceal abscess   Admit.   ED Discharge Orders    None       Carlisle Cater, Hershal Coria 07/08/18 1850    Daleen Bo, MD 07/09/18 [phone]

## 2018-07-08 NOTE — H&P (Signed)
History and Physical   [name] HYW:[id number] DOB: Jul 07, 1946 DOA: 07/08/2018  Referring MD/NP/PA: Dr. Rogene Houston  PCP: [name], MD   Patient coming from: Home  Chief Complaint: Abdominal pain in the right lower quadrant  HPI: [name] is a 72 y.o. female with medical history significant of hypertension, hypothyroidism, bipolar disorder, history of CVA and TIAs, chronic constipation, IBS and psoriasis that came to the ER with 2 to 3 weeks history of right lower quadrant abdominal pain which has progressively gotten worse.  The pain was rated as 10 out of 10 radiating to her umbilical area.  Associated with some low-grade temperature.  She also had some diarrhea.  This quickly changed to constipation.  She has been having black tarry stools for about a week now.  Patient came to the ER and was evaluated.  She was found to have right lower quadrant mass consistent with appendiceal abscess.  Surgery has been consulted and recommendation is admission with possible percutaneous drainage.  She denied any nausea or vomiting at this point.  Pain has improved with treatment in the ER..  ED Course: Temperature is 99 for blood pressure 157/74 with pulse 85 respiratory of 20 oxygen sat 87% room air initially.  Her white count is 11.5 with hemoglobin 12.6 platelets 252 sodium is 132 potassium 4.0 chloride 96 and calcium 8.8 glucose 128.  Urinalysis is negative.  Chest x-ray showed no active disease.  CT abdomen pelvis showed Acute appendicitis with periappendiceal stranding and a periappendiceal abscess containing gas and fluid measuring up to 3.4 cm in diameter, favoring early rupture. Aside from this abscess there is no other extraluminal gas as well as Peripheral interstitial accentuation in the lung bases favoring UIP. 3. Infrarenal abdominal aortic aneurysm 3.1 cm in diameter.  Patient initiated on IV antibiotics and be admitted  Review of Systems: As per HPI otherwise 10 point review  of systems negative.    Past Medical History:  Diagnosis Date  . Bipolar disorder ([place])   . CAD (coronary artery disease)    intra and extracranial vascular dx per MRI 4/11, neurology rec strict CVRF control  . Colonic inertia   . Constipation    chronic;severe  . Depression   . Duodenitis   . EKG abnormalities    changes, stress test neg (false EKG changes)  . Gastritis   . GERD (gastroesophageal reflux disease)   . Hypertension   . Hypothyroid 01/16/2014  . Meningioma ([place]) 10/21/2013  . Psoriasis    sees derm  . Stroke [facility])     Past Surgical History:  Procedure Laterality Date  . arthroscopy  04/2010   Right knee  . TUBAL LIGATION       reports that she has been smoking cigarettes. She has a 60.00 pack-year smoking history. She has never used smokeless tobacco. She reports that she drinks alcohol. She reports that she does not use drugs.  No Known Allergies  Family History  Problem Relation Age of Onset  . Breast cancer Mother        metastisis to bones  . Diabetes Son   . Heart disease Unknown        grandfather   . Alcohol abuse Brother   . Heart disease Brother   . Heart disease Maternal Aunt   . Lung cancer Brother        smoked  . Colon cancer Neg Hx      Prior to Admission medications   Medication Sig  Start Date End Date Taking? Authorizing Provider  amLODipine (NORVASC) 10 MG tablet Take 1 tablet (10 mg total) by mouth daily. 10/25/17  Yes Rai, Ripudeep K, MD  aspirin EC 325 MG EC tablet Take 1 tablet (325 mg total) by mouth daily. 10/25/17  Yes Rai, Ripudeep K, MD  bisacodyl (DULCOLAX) 5 MG EC tablet Take 10 mg by mouth at bedtime.    Yes [provider]  CASCARA SAGRADA PO Take 2 capsules by mouth at bedtime.   Yes [provider]  clonazePAM (KLONOPIN) 0.5 MG tablet Take 0.5 mg by mouth at bedtime.   Yes [provider]  DULoxetine (CYMBALTA) 30 MG capsule 30 mg at bedtime.   Yes [provider]  DULoxetine  (CYMBALTA) 60 MG capsule Take 60 mg by mouth every morning.    Yes [provider]  famotidine (PEPCID) 20 MG tablet One at bedtime 02/14/16  Yes Tanda Rockers, MD  lamoTRIgine (LAMICTAL) 100 MG tablet Take 100 mg by mouth daily.  12/18/17  Yes [provider]  linaclotide Rolan Lipa) 145 MCG CAPS capsule Take 1 capsule (145 mcg total) by mouth daily before breakfast. 11/28/17  Yes Armbruster, Carlota Raspberry, MD  losartan (COZAAR) 50 MG tablet Take 1 tablet (50 mg total) by mouth daily. 10/25/17  Yes Rai, Ripudeep K, MD  meclizine (ANTIVERT) 12.5 MG tablet Take 1 tablet (12.5 mg total) by mouth 3 (three) times daily. 05/23/18  Yes Kathrynn Ducking, MD  polyethylene glycol [facility] / Floria Raveling) packet Take 34 g by mouth every other day.    Yes [provider]  rOPINIRole (REQUIP) 2 MG tablet TAKE 1 TABLET BY MOUTH AT BEDTIME Patient taking differently: Take 2 mg by mouth at bedtime.  05/27/12  Yes Dunn, Areta Haber, PA-C  senna (SENOKOT) 8.6 MG TABS tablet Take 2 tablets by mouth at bedtime.    Yes [provider]  mupirocin ointment (BACTROBAN) 2 % Apply 1 application topically 2 (two) times daily. Patient not taking: Reported on 07/08/2018 05/30/18   [name], MD    Physical Exam: Vitals:   07/08/18 2030 07/08/18 2045 07/08/18 2046 07/08/18 2124  BP: (!) 172/75 (!) 159/80 (!) 159/80 (!) 180/86  Pulse: 85   82  Resp:   14 17  Temp:   99.3 F (37.4 C) 99.4 F (37.4 C)  TempSrc:   Oral Oral  SpO2: (!) 87%  95% 97%  Weight:    70.3 kg  Height:    5\' 7"  (1.702 m)      Constitutional: NAD, calm, comfortable Vitals:   07/08/18 2030 07/08/18 2045 07/08/18 2046 07/08/18 2124  BP: (!) 172/75 (!) 159/80 (!) 159/80 (!) 180/86  Pulse: 85   82  Resp:   14 17  Temp:   99.3 F (37.4 C) 99.4 F (37.4 C)  TempSrc:   Oral Oral  SpO2: (!) 87%  95% 97%  Weight:    70.3 kg  Height:    5\' 7"  (1.702 m)   Eyes: PERRL, lids and conjunctivae normal ENMT: Mucous membranes  are moist. Posterior pharynx clear of any exudate or lesions.Normal dentition.  Neck: normal, supple, no masses, no thyromegaly Respiratory: clear to auscultation bilaterally, no wheezing, no crackles. Normal respiratory effort. No accessory muscle use.  Cardiovascular: Regular rate and rhythm, no murmurs / rubs / gallops. No extremity edema. 2+ pedal pulses. No carotid bruits.  Abdomen: RLQ tenderness, RLQ masses palpated. No hepatosplenomegaly. Bowel sounds positive.  Musculoskeletal: no clubbing /  cyanosis. No joint deformity upper and lower extremities. Good ROM, no contractures. Normal muscle tone.  Skin: no rashes, lesions, ulcers. No induration Neurologic: CN 2-12 grossly intact. Sensation intact, DTR normal. Strength 5/5 in all 4.  Psychiatric: Normal judgment and insight. Alert and oriented x 3. Normal mood.     Labs on Admission: I have personally reviewed following labs and imaging studies  CBC: Recent Labs  Lab 07/08/18 1344  WBC 11.5*  HGB 12.6  HCT 40.8  MCV 92.7  PLT 700   Basic Metabolic Panel: Recent Labs  Lab 07/08/18 1344  NA 132*  K 4.0  CL 96*  CO2 29  GLUCOSE 128*  BUN 8  CREATININE 0.81  CALCIUM 8.8*   GFR: Estimated Creatinine Clearance: 59.3 mL/min (by C-G formula based on SCr of 0.81 mg/dL). Liver Function Tests: Recent Labs  Lab 07/08/18 1344  AST 15  ALT 11  ALKPHOS 66  BILITOT 0.8  PROT 7.0  ALBUMIN 3.6   Recent Labs  Lab 07/08/18 1344  LIPASE 22   No results for input(s): AMMONIA in the last 168 hours. Coagulation Profile: No results for input(s): INR, PROTIME in the last 168 hours. Cardiac Enzymes: No results for input(s): CKTOTAL, CKMB, CKMBINDEX, TROPONINI in the last 168 hours. BNP (last 3 results) No results for input(s): PROBNP in the last 8760 hours. HbA1C: No results for input(s): HGBA1C in the last 72 hours. CBG: No results for input(s): GLUCAP in the last 168 hours. Lipid Profile: No results for input(s): CHOL,  HDL, LDLCALC, TRIG, CHOLHDL, LDLDIRECT in the last 72 hours. Thyroid Function Tests: No results for input(s): TSH, T4TOTAL, FREET4, T3FREE, THYROIDAB in the last 72 hours. Anemia Panel: No results for input(s): VITAMINB12, FOLATE, FERRITIN, TIBC, IRON, RETICCTPCT in the last 72 hours. Urine analysis:    Component Value Date/Time   COLORURINE YELLOW 07/08/2018 2231   APPEARANCEUR CLEAR 07/08/2018 2231   LABSPEC 1.025 07/08/2018 2231   PHURINE 7.0 07/08/2018 2231   GLUCOSEU NEGATIVE 07/08/2018 2231   HGBUR SMALL (A) 07/08/2018 2231   BILIRUBINUR NEGATIVE 07/08/2018 2231   BILIRUBINUR neg 07/31/2013 1752   KETONESUR NEGATIVE 07/08/2018 2231   PROTEINUR NEGATIVE 07/08/2018 2231   UROBILINOGEN 0.2 07/31/2013 1752   NITRITE NEGATIVE 07/08/2018 2231   LEUKOCYTESUR NEGATIVE 07/08/2018 2231   Sepsis Labs: @LABRCNTIP (procalcitonin:4,lacticidven:4) )No results found for this or any previous visit (from the past 240 hour(s)).   Radiological Exams on Admission: Dg Chest 2 View  Result Date: 07/08/2018 CLINICAL DATA:  Cough for a year. History of chronic interstitial lung disease bronchitis. EXAM: CHEST - 2 VIEW COMPARISON:  Chest radiograph October 24, 2017 and CT chest Jan 20, 2016. FINDINGS: Similar chronic interstitial changes confluent in lung bases. No pleural effusion or focal consolidation. Cardiomediastinal silhouette is normal. Calcified aortic arch. No pneumothorax. Soft tissue planes and included osseous structures are non suspicious. Moderate degenerative change of the thoracic spine. IMPRESSION: Chronic interstitial changes without focal consolidation. Aortic Atherosclerosis (ICD10-I70.0). Electronically Signed   By: Elon Alas M.D.   On: 07/08/2018 15:03   Ct Abdomen Pelvis W Contrast  Result Date: 07/08/2018 CLINICAL DATA:  Progressive right lower quadrant abdominal pain with fever and chills. EXAM: CT ABDOMEN AND PELVIS WITH CONTRAST TECHNIQUE: Multidetector CT imaging  of the abdomen and pelvis was performed using the standard protocol following bolus administration of intravenous contrast. CONTRAST:  54mL OMNIPAQUE IOHEXOL 300 MG/ML SOLN, 64mL OMNIPAQUE IOHEXOL 300 MG/ML SOLN COMPARISON:  Multiple exams, including 11/05/2009 FINDINGS: Lower chest:  Mild peripheral interstitial accentuation in the lung bases favoring the lower lobes, new compared to 11/05/2009 and mildly progressive compared to 01/20/2016, with subpleural reticulation suggesting UIP. Descending thoracic aortic atherosclerotic calcification. Right coronary artery atherosclerotic calcification. Hepatobiliary: Unremarkable Pancreas: Unremarkable Spleen: Unremarkable Adrenals/Urinary Tract: Left kidney lower pole benign cyst. Hypodense lesions in the right kidney are technically too small to characterize although statistically likely to be cysts. Stomach/Bowel: A tubular structure below the cecum separate from the terminal ileum and likely representing inflamed appendix measures up to 1.5 cm in diameter. There is an adjacent collection of gas and fluid measuring 3.4 by 1.9 by 2.6 cm compatible with periappendiceal abscess. The extraluminal gas appears contained and I do not see any other extraluminal gas or truly free air. Vascular/Lymphatic: Infrarenal abdominal aortic aneurysm 3.1 cm transverse on image 83/6. Aortoiliac atherosclerotic vascular disease. Reproductive: Unremarkable Other: No supplemental non-categorized findings. Musculoskeletal: Lumbar scoliosis, spondylosis, and degenerative disc disease. IMPRESSION: 1. Acute appendicitis with periappendiceal stranding and a periappendiceal abscess containing gas and fluid measuring up to 3.4 cm in diameter, favoring early rupture. Aside from this abscess there is no other extraluminal gas. 2. 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 3. Infrarenal abdominal aortic aneurysm 3.1 cm in diameter. Recommend followup by [place] in 3 years. This  recommendation follows ACR consensus guidelines: White Paper of the ACR Incidental Findings Committee II on Vascular Findings. J Am Coll Radiol 2013; 79:150-569 4. Other imaging findings of potential clinical significance: Aortic Atherosclerosis (ICD10-I70.0). Lumbar scoliosis, spondylosis, and degenerative disc disease. Right coronary artery atherosclerosis. Electronically Signed   By: Van Clines M.D.   On: 07/08/2018 16:54    Assessment/Plan Principal Problem:   Appendiceal abscess Active Problems:   RLS (restless legs syndrome)   Postinflammatory pulmonary fibrosis (HCC)   Smoker   Hyperlipidemia   PAD (peripheral artery disease) ([place])     #1 appendiceal abscess: Patient will be admitted.  Continue IV antibiotics.  Pain control as well as supportive care.  Patient will be subjected to percutaneous CT-guided drainage with culture and sensitivities.  #2 tobacco abuse: Nicotine patch and tobacco cessation counseling.  #3 PAD: Stable.   #4 Pulmonary Fibrosis: Patient is breathing better  #5 RLS: Continue ropinorole.   DVT prophylaxis: SCD  Code Status: Full Code  Family Communication: Sister who was available in patient  Disposition Plan: We determined Consults called: General surgery, Dr. Nadeen Landau Admission status: Inpatient  Severity of Illness: The appropriate patient status for this patient is INPATIENT. Inpatient status is judged to be reasonable and necessary in order to provide the required intensity of service to ensure the patient's safety. The patient's presenting symptoms, physical exam findings, and initial radiographic and laboratory data in the context of their chronic comorbidities is felt to place them at high risk for further clinical deterioration. Furthermore, it is not anticipated that the patient will be medically stable for discharge from the hospital within 2 midnights of admission. The following factors support the patient status of inpatient.    " The patient's presenting symptoms include right lower quadrant abdominal pain. " The worrisome physical exam findings include tender right lower quadrant. " The initial radiographic and laboratory data are worrisome because of significant leukocytosis and fever. " The chronic co-morbidities include hypertension hypothyroidism.   * I certify that at the point of admission it is my clinical judgment that the patient will require inpatient hospital care spanning beyond 2 midnights from the point of admission due to high  intensity of service, high risk for further deterioration and high frequency of surveillance required.Barbette Merino MD [facility] Pager [phone]  If 7PM-7AM, please contact night-coverage [email or web address] Password [facility]  07/08/2018, 10:53 PM

## 2018-07-08 NOTE — Telephone Encounter (Signed)
Incoming  Call from Patient with complaint of right sided flank pain.  Patient states "it feels like bad gas." when she presses on her abdomin it hurts.  States she has a slight fever with chills. Rates the pain mild to moderate,  is constant. Patient thinks that it is appendicitis.  Patient reports,legs are weak,  and has  low grade  fever with chills, and nausea.  Per protocol recommended that patient be evaluated  At Emergency room or Urgent Care. Patient voiced understanding and chose to be evaluated at Urgent Care.    Reason for Disposition . [1] Sudden onset of severe flank pain AND [2] age > 89  Answer Assessment - Initial Assessment Questions 1. LOCATION: "Where does it hurt?" (e.g., left, right)     Right side 2. ONSET: "When did the pain start?"     Ten days ago 3. SEVERITY: "How bad is the pain?" (e.g., Scale 1-10; mild, moderate, or severe)   - MILD (1-3): doesn't interfere with normal activities    - MODERATE (4-7): interferes with normal activities or awakens from sleep    - SEVERE (8-10): excruciating pain and patient unable to do normal activities (stays in bed)       7 to 8 4. PATTERN: "Does the pain come and go, or is it constant?"      constant 5. CAUSE: "What do you think is causing the pain?"     appendisitis 6. OTHER SYMPTOMS:  "Do you have any other symptoms?" (e.g., fever, abdominal pain, vomiting, leg weakness, burning with urination, blood in urine)     Legs are weak , low grade fever with chills  Nauseated.  7. PREGNANCY:  "Is there any chance you are pregnant?" "When was your last menstrual period?"     na  Protocols used: FLANK PAIN-A-AH

## 2018-07-08 NOTE — ED Triage Notes (Signed)
Pt in c/o RLQ abdominal pain for the last two weeks, symptoms keep getting worse, reports nausea with this and decreased appetite, also constipation but LBM was today

## 2018-07-09 ENCOUNTER — Other Ambulatory Visit: Payer: Self-pay

## 2018-07-09 ENCOUNTER — Encounter (HOSPITAL_COMMUNITY): Payer: Self-pay | Admitting: General Practice

## 2018-07-09 LAB — COMPREHENSIVE METABOLIC PANEL
ALBUMIN: 3 g/dL — AB (ref 3.5–5.0)
ALK PHOS: 65 U/L (ref 38–126)
ALT: 9 U/L (ref 0–44)
ANION GAP: 8 (ref 5–15)
AST: 12 U/L — AB (ref 15–41)
BILIRUBIN TOTAL: 0.6 mg/dL (ref 0.3–1.2)
BUN: 6 mg/dL — AB (ref 8–23)
CO2: 27 mmol/L (ref 22–32)
Calcium: 8.4 mg/dL — ABNORMAL LOW (ref 8.9–10.3)
Chloride: 98 mmol/L (ref 98–111)
Creatinine, Ser: 1.02 mg/dL — ABNORMAL HIGH (ref 0.44–1.00)
GFR calc Af Amer: >60 mL/min (ref >60)
GFR calc non Af Amer: 53 mL/min — ABNORMAL LOW (ref >60)
GLUCOSE: 143 mg/dL — AB (ref 70–99)
POTASSIUM: 3.6 mmol/L (ref 3.5–5.1)
Sodium: 133 mmol/L — ABNORMAL LOW (ref 135–145)
TOTAL PROTEIN: 6.1 g/dL — AB (ref 6.5–8.1)

## 2018-07-09 LAB — CBC
HCT: 39 % (ref 36.0–46.0)
HEMOGLOBIN: 12 g/dL (ref 12.0–15.0)
MCH: 28.6 pg (ref 26.0–34.0)
MCHC: 30.8 g/dL (ref 30.0–36.0)
MCV: 93.1 fL (ref 80.0–100.0)
PLATELETS: 213 10*3/uL (ref 150–400)
RBC: 4.19 MIL/uL (ref 3.87–5.11)
RDW: 13.4 % (ref 11.5–15.5)
WBC: 13.3 10*3/uL — ABNORMAL HIGH (ref 4.0–10.5)
nRBC: 0 % (ref 0.0–0.2)

## 2018-07-09 LAB — PROTIME-INR
INR: 1.15
PROTHROMBIN TIME: 14.6 s (ref 11.4–15.2)

## 2018-07-09 LAB — TROPONIN I: Troponin I: <0.03 ng/mL (ref <0.03)

## 2018-07-09 MED ORDER — HYDROMORPHONE HCL 1 MG/ML IJ SOLN
1.0000 mg | INTRAMUSCULAR | Status: DC | PRN
  Administered 2018-07-09: 1 mg via INTRAVENOUS
  Filled 2018-07-09: qty 1

## 2018-07-09 MED ORDER — HYDROMORPHONE HCL 1 MG/ML IJ SOLN
0.5000 mg | Freq: Once | INTRAMUSCULAR | Status: AC
  Administered 2018-07-09: 0.5 mg via INTRAVENOUS
  Filled 2018-07-09: qty 1

## 2018-07-09 MED ORDER — TRAMADOL HCL 50 MG PO TABS
50.0000 mg | ORAL_TABLET | Freq: Four times a day (QID) | ORAL | Status: DC | PRN
  Administered 2018-07-09 – 2018-07-14 (×2): 50 mg via ORAL
  Filled 2018-07-09 (×3): qty 1

## 2018-07-09 MED ORDER — DEXTROSE-NACL 5-0.45 % IV SOLN
INTRAVENOUS | Status: DC
  Administered 2018-07-09 – 2018-07-16 (×13): via INTRAVENOUS

## 2018-07-09 MED ORDER — PANTOPRAZOLE SODIUM 40 MG PO TBEC
40.0000 mg | DELAYED_RELEASE_TABLET | Freq: Every day | ORAL | Status: DC
  Administered 2018-07-09 – 2018-07-16 (×7): 40 mg via ORAL
  Filled 2018-07-09 (×7): qty 1

## 2018-07-09 MED ORDER — HYDRALAZINE HCL 20 MG/ML IJ SOLN: 10.0000 mg | Freq: Four times a day (QID) | INTRAMUSCULAR | Status: DC | PRN

## 2018-07-09 MED ORDER — KETOROLAC TROMETHAMINE 15 MG/ML IJ SOLN
15.0000 mg | Freq: Four times a day (QID) | INTRAMUSCULAR | Status: DC | PRN
  Administered 2018-07-09 – 2018-07-13 (×10): 15 mg via INTRAVENOUS
  Filled 2018-07-09 (×10): qty 1

## 2018-07-09 MED ORDER — ATORVASTATIN CALCIUM 40 MG PO TABS
40.0000 mg | ORAL_TABLET | Freq: Every day | ORAL | Status: DC
  Administered 2018-07-10 – 2018-07-15 (×4): 40 mg via ORAL
  Filled 2018-07-09 (×4): qty 1

## 2018-07-09 MED ORDER — ASPIRIN 81 MG PO CHEW
81.0000 mg | CHEWABLE_TABLET | Freq: Every day | ORAL | Status: DC
  Administered 2018-07-09 – 2018-07-16 (×7): 81 mg via ORAL
  Filled 2018-07-09 (×7): qty 1

## 2018-07-09 MED ORDER — PIPERACILLIN-TAZOBACTAM 3.375 G IVPB
3.3750 g | Freq: Three times a day (TID) | INTRAVENOUS | Status: DC
  Administered 2018-07-09 – 2018-07-16 (×22): 3.375 g via INTRAVENOUS
  Filled 2018-07-09 (×21): qty 50

## 2018-07-09 MED ORDER — MORPHINE SULFATE (PF) 2 MG/ML IV SOLN: 2.0000 mg | INTRAVENOUS | Status: DC | PRN

## 2018-07-09 MED ORDER — ACETAMINOPHEN 325 MG PO TABS
650.0000 mg | ORAL_TABLET | Freq: Four times a day (QID) | ORAL | Status: DC
  Administered 2018-07-09 – 2018-07-16 (×7): 650 mg via ORAL
  Filled 2018-07-09 (×12): qty 2

## 2018-07-09 NOTE — Progress Notes (Signed)
Patient ID: [name], female   DOB: 1946-02-24, 72 y.o.   MRN: [id number]   Request made fro IR Rad to review imaging for possible drain placement  Dr Earleen Newport reviewed imaging and chart Feels this abscess is too small and NO window to safely access small abscess  NO drain placement in IR  Will inform PA with [facility] RN aware

## 2018-07-09 NOTE — Progress Notes (Signed)
Patient Demographics:    [name], is a 72 y.o. female, DOB - [birth year], VQQ:[id number]  Admit date - 07/08/2018   Admitting Physician Elwyn Reach, MD  Outpatient Primary MD for the patient is [name], MD  LOS - 1   Chief Complaint  Patient presents with  . Abdominal Pain        Subjective:    [name] today has no fevers, no emesis, brief episode of epigastric/retrosternal/chest pain at rest, EKG without acute findings,   Assessment  & Plan :    Principal Problem:   Appendiceal abscess Active Problems:   RLS (restless legs syndrome)   Postinflammatory pulmonary fibrosis (HCC)   Smoker   Hyperlipidemia   PAD (peripheral artery disease) (HCC)  Brief Summary  72 y.o. female with  PMHx of HTN, hypothyroidism, bipolar disorder, prior  CVA and TIAs, chronic constipation, IBS and psoriasis admitted on 07/08/2018 with abdominal pain of 2 weeks duration and found to have ruptured appendicitis with abscess   Plan:- 1)Acute Perforated Appendicitis with Abscess---CT w/ a 3.4 x 1.9 x 2.6 cm fluid collection, per  interventional radiology no good window for drainage, discussed with general surgery team recommend conservative management with IV antibiotics and  bowel rest, diet to be advanced slowly per surgical team recommendations, continue IV Zosyn antiemetics and PRN pain medications, patient is afebrile white count is up to 13.3, CEA is pending... Patient is afebrile  2)PAD--- stable, give aspirin 81 mg daily and Lipitor   3) episode of retrosternal/epigastric discomfort-----patient gives a history of GERD, suspect GI related on cardiovascular etiology, EKG unremarkable, get troponin, is already on aspirin and Lipitor, give Protonix  4)HTN--- okay to restart amlodipine 10 mg daily, hold losartan due to risk of dehydration and AKI at this time,   may use IV Hydralazine 10 mg   Every 4 hours Prn for systolic blood pressure over 160 mmhg  5)Bipolar Disorder--- stable, restart Cymbalta, clonazepam, Lamictal  6)FEN--- hydrate with IV dextrose solution until oral intake is reliable  Disposition/Need for in-Hospital Stay- patient unable to be discharged at this time due to ruptured appendix with abscess requiring IV antibiotics, unable to take adequate oral intake given ruptured viscus needs IV fluids  Code Status : Full   Disposition Plan  : Home  Consults  :  gen surgery  DVT Prophylaxis  :   SCDs    Lab Results  Component Value Date   PLT 213 07/09/2018    Inpatient Medications  Scheduled Meds: . acetaminophen  650 mg Oral Q6H  . amLODipine  10 mg Oral Daily  . aspirin  81 mg Oral Daily  . [START ON 07/10/2018] atorvastatin  40 mg Oral q1800  . clonazePAM  0.5 mg Oral QHS  . DULoxetine  30 mg Oral QHS  . DULoxetine  60 mg Oral BH-q7a  . lamoTRIgine  100 mg Oral Daily  . rOPINIRole  2 mg Oral QHS   Continuous Infusions: . dextrose 5 % and 0.45% NaCl    . piperacillin-tazobactam (ZOSYN)  IV 3.375 g (07/09/18 1739)   PRN Meds:.hydrALAZINE, HYDROmorphone (DILAUDID) injection, ketorolac, ondansetron **OR** ondansetron (ZOFRAN) IV, traMADol    Anti-infectives (From admission, onward)   Start  Dose/Rate Route Frequency Ordered Stop   07/09/18 0700  piperacillin-tazobactam (ZOSYN) IVPB 3.375 g     3.375 g 12.5 mL/hr over 240 Minutes Intravenous Every 8 hours 07/09/18 0645     07/08/18 1715  piperacillin-tazobactam (ZOSYN) IVPB 3.375 g     3.375 g 12.5 mL/hr over 240 Minutes Intravenous  Once 07/08/18 1711 07/08/18 2319        Objective:   Vitals:   07/09/18 0618 07/09/18 1204 07/09/18 1430 07/09/18 1538  BP: (!) 153/76 103/86 (!) 155/74 133/65  Pulse: 77 71 75 71  Resp: 18  16 16   Temp: 99.5 F (37.5 C) 99 F (37.2 C) 97.7 F (36.5 C) 98.5 F (36.9 C)  TempSrc: Oral Oral Oral Oral  SpO2: (!) 86% 94% 93% 96%  Weight:      Height:         Wt Readings from Last 3 Encounters:  07/08/18 70.3 kg  03/15/18 71.9 kg  03/12/18 71.7 kg     Intake/Output Summary (Last 24 hours) at 07/09/2018 1905 Last data filed at 07/09/2018 1430 Gross per 24 hour  Intake 1223.18 ml  Output 800 ml  Net 423.18 ml     Physical Exam Patient is examined daily including today on 07/09/18 , exams remain the same as of yesterday except that has changed   Gen:- Awake Alert,  In no apparent distress  HEENT:- [place].AT, No sclera icterus Neck-Supple Neck,No JVD,.  Lungs-  CTAB , good air movement CV- S1, S2 normal, regular Abd-  +ve B.Sounds, Abd Soft, generalized abdominal tenderness worse in epigastric and right lower quadrant areas without rebound or guarding  extremity/Skin:- No  edema,   pulses Psych-affect is appropriate, oriented x3 Neuro-no new focal deficits, no tremors   Data Review:   Micro Results No results found for this or any previous visit (from the past 240 hour(s)).  Radiology Reports Dg Chest 2 View  Result Date: 07/08/2018 CLINICAL DATA:  Cough for a year. History of chronic interstitial lung disease bronchitis. EXAM: CHEST - 2 VIEW COMPARISON:  Chest radiograph October 24, 2017 and CT chest Jan 20, 2016. FINDINGS: Similar chronic interstitial changes confluent in lung bases. No pleural effusion or focal consolidation. Cardiomediastinal silhouette is normal. Calcified aortic arch. No pneumothorax. Soft tissue planes and included osseous structures are non suspicious. Moderate degenerative change of the thoracic spine. IMPRESSION: Chronic interstitial changes without focal consolidation. Aortic Atherosclerosis (ICD10-I70.0). Electronically Signed   By: Elon Alas M.D.   On: 07/08/2018 15:03   Ct Abdomen Pelvis W Contrast  Result Date: 07/08/2018 CLINICAL DATA:  Progressive right lower quadrant abdominal pain with fever and chills. EXAM: CT ABDOMEN AND PELVIS WITH CONTRAST TECHNIQUE: Multidetector CT imaging of  the abdomen and pelvis was performed using the standard protocol following bolus administration of intravenous contrast. CONTRAST:  77mL OMNIPAQUE IOHEXOL 300 MG/ML SOLN, 78mL OMNIPAQUE IOHEXOL 300 MG/ML SOLN COMPARISON:  Multiple exams, including 11/05/2009 FINDINGS: Lower chest: Mild peripheral interstitial accentuation in the lung bases favoring the lower lobes, new compared to 11/05/2009 and mildly progressive compared to 01/20/2016, with subpleural reticulation suggesting UIP. Descending thoracic aortic atherosclerotic calcification. Right coronary artery atherosclerotic calcification. Hepatobiliary: Unremarkable Pancreas: Unremarkable Spleen: Unremarkable Adrenals/Urinary Tract: Left kidney lower pole benign cyst. Hypodense lesions in the right kidney are technically too small to characterize although statistically likely to be cysts. Stomach/Bowel: A tubular structure below the cecum separate from the terminal ileum and likely representing inflamed appendix measures up to 1.5 cm in diameter. There  is an adjacent collection of gas and fluid measuring 3.4 by 1.9 by 2.6 cm compatible with periappendiceal abscess. The extraluminal gas appears contained and I do not see any other extraluminal gas or truly free air. Vascular/Lymphatic: Infrarenal abdominal aortic aneurysm 3.1 cm transverse on image 83/6. Aortoiliac atherosclerotic vascular disease. Reproductive: Unremarkable Other: No supplemental non-categorized findings. Musculoskeletal: Lumbar scoliosis, spondylosis, and degenerative disc disease. IMPRESSION: 1. Acute appendicitis with periappendiceal stranding and a periappendiceal abscess containing gas and fluid measuring up to 3.4 cm in diameter, favoring early rupture. Aside from this abscess there is no other extraluminal gas. 2. 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 3. Infrarenal abdominal aortic aneurysm 3.1 cm in diameter. Recommend followup by [place] in 3 years. This  recommendation follows ACR consensus guidelines: White Paper of the ACR Incidental Findings Committee II on Vascular Findings. J Am Coll Radiol 2013; 06:301-601 4. Other imaging findings of potential clinical significance: Aortic Atherosclerosis (ICD10-I70.0). Lumbar scoliosis, spondylosis, and degenerative disc disease. Right coronary artery atherosclerosis. Electronically Signed   By: Van Clines M.D.   On: 07/08/2018 16:54     CBC Recent Labs  Lab 07/08/18 1344 07/09/18 0757  WBC 11.5* 13.3*  HGB 12.6 12.0  HCT 40.8 39.0  PLT 252 213  MCV 92.7 93.1  MCH 28.6 28.6  MCHC 30.9 30.8  RDW 13.5 13.4    Chemistries  Recent Labs  Lab 07/08/18 1344 07/09/18 0757  NA 132* 133*  K 4.0 3.6  CL 96* 98  CO2 29 27  GLUCOSE 128* 143*  BUN 8 6*  CREATININE 0.81 1.02*  CALCIUM 8.8* 8.4*  AST 15 12*  ALT 11 9  ALKPHOS 66 65  BILITOT 0.8 0.6   ------------------------------------------------------------------------------------------------------------------ No results for input(s): CHOL, HDL, LDLCALC, TRIG, CHOLHDL, LDLDIRECT in the last 72 hours.  Lab Results  Component Value Date   HGBA1C 5.8 (H) 10/24/2017   ------------------------------------------------------------------------------------------------------------------ No results for input(s): TSH, T4TOTAL, T3FREE, THYROIDAB in the last 72 hours.  Invalid input(s): FREET3 ------------------------------------------------------------------------------------------------------------------ No results for input(s): VITAMINB12, FOLATE, FERRITIN, TIBC, IRON, RETICCTPCT in the last 72 hours.  Coagulation profile Recent Labs  Lab 07/09/18 0757  INR 1.15    No results for input(s): DDIMER in the last 72 hours.  Cardiac Enzymes No results for input(s): CKMB, TROPONINI, MYOGLOBIN in the last 168 hours.  Invalid input(s):  CK ------------------------------------------------------------------------------------------------------------------ No results found for: BNP   Roxan Hockey M.D on 07/09/2018 at 7:05 PM  Pager---[phone] Go to [email or web address] - password [email or web address] for contact info  [facility] - Office  [phone]

## 2018-07-09 NOTE — Progress Notes (Signed)
Received patient from ED. A&O x4, pain 4/10. No family at bedside, will continue to monitor.

## 2018-07-09 NOTE — Progress Notes (Addendum)
[facility] Progress Note     Subjective: CC:  Patient reports feeling ok this morning but then expresses a lot of hesitation about undergoing abdominal exam or moving. Reports 2 weeks of pain with nausea. Reports a dark BM yesterday. Reports taking one aleve capsule daily for about 2 years but denies hx UGI bleed or peptic ulcer.  Objective: Vital signs in last 24 hours: Temp:  [98.1 F (36.7 C)-99.5 F (37.5 C)] 99.5 F (37.5 C) (10/29 0618) Pulse Rate:  [72-85] 77 (10/29 0618) Resp:  [14-20] 18 (10/29 0618) BP: (151-196)/(69-89) 153/76 (10/29 0618) SpO2:  [86 %-99 %] 86 % (10/29 0618) Weight:  [70.3 kg] 70.3 kg (10/28 2124) Last BM Date: 07/07/18  Intake/Output from previous day: 10/28 0701 - 10/29 0700 In: 1473.2 [P.O.:340; I.V.:633.2; IV Piggyback:500] Out: 800 [Urine:800] Intake/Output this shift: No intake/output data recorded.  PE: Gen:  Alert, NAD, cooperative  Card:  Regular rate and rhythm, pedal pulses 2+ BL Pulm:  Normal effort, clear to auscultation bilaterally Abd: Soft, protuberant, TTP RLQ with guarding and localized peritonitis, hypoactive BS Skin: warm and dry, no rashes  Psych: A&Ox3   Lab Results:  Recent Labs    07/08/18 1344  WBC 11.5*  HGB 12.6  HCT 40.8  PLT 252   BMET Recent Labs    07/08/18 1344  NA 132*  K 4.0  CL 96*  CO2 29  GLUCOSE 128*  BUN 8  CREATININE 0.81  CALCIUM 8.8*   PT/INR No results for input(s): LABPROT, INR in the last 72 hours. CMP     Component Value Date/Time   NA 132 (L) 07/08/2018 1344   NA 139 03/05/2018 1758   K 4.0 07/08/2018 1344   CL 96 (L) 07/08/2018 1344   CO2 29 07/08/2018 1344   GLUCOSE 128 (H) 07/08/2018 1344   BUN 8 07/08/2018 1344   BUN 13 03/05/2018 1758   CREATININE 0.81 07/08/2018 1344   CREATININE 0.77 07/28/2015 1805   CALCIUM 8.8 (L) 07/08/2018 1344   PROT 7.0 07/08/2018 1344   PROT 7.4 03/05/2018 1758   ALBUMIN 3.6 07/08/2018 1344   ALBUMIN 4.4 03/05/2018 1758    AST 15 07/08/2018 1344   ALT 11 07/08/2018 1344   ALKPHOS 66 07/08/2018 1344   BILITOT 0.8 07/08/2018 1344   BILITOT 0.3 03/05/2018 1758   GFRNONAA >60 07/08/2018 1344   GFRAA >60 07/08/2018 1344   Lipase     Component Value Date/Time   LIPASE 22 07/08/2018 1344       Studies/Results: Dg Chest 2 View  Result Date: 07/08/2018 CLINICAL DATA:  Cough for a year. History of chronic interstitial lung disease bronchitis. EXAM: CHEST - 2 VIEW COMPARISON:  Chest radiograph October 24, 2017 and CT chest Jan 20, 2016. FINDINGS: Similar chronic interstitial changes confluent in lung bases. No pleural effusion or focal consolidation. Cardiomediastinal silhouette is normal. Calcified aortic arch. No pneumothorax. Soft tissue planes and included osseous structures are non suspicious. Moderate degenerative change of the thoracic spine. IMPRESSION: Chronic interstitial changes without focal consolidation. Aortic Atherosclerosis (ICD10-I70.0). Electronically Signed   By: Elon Alas M.D.   On: 07/08/2018 15:03   Ct Abdomen Pelvis W Contrast  Result Date: 07/08/2018 CLINICAL DATA:  Progressive right lower quadrant abdominal pain with fever and chills. EXAM: CT ABDOMEN AND PELVIS WITH CONTRAST TECHNIQUE: Multidetector CT imaging of the abdomen and pelvis was performed using the standard protocol following bolus administration of intravenous contrast. CONTRAST:  28mL OMNIPAQUE IOHEXOL 300 MG/ML  SOLN, 92mL OMNIPAQUE IOHEXOL 300 MG/ML SOLN COMPARISON:  Multiple exams, including 11/05/2009 FINDINGS: Lower chest: Mild peripheral interstitial accentuation in the lung bases favoring the lower lobes, new compared to 11/05/2009 and mildly progressive compared to 01/20/2016, with subpleural reticulation suggesting UIP. Descending thoracic aortic atherosclerotic calcification. Right coronary artery atherosclerotic calcification. Hepatobiliary: Unremarkable Pancreas: Unremarkable Spleen: Unremarkable  Adrenals/Urinary Tract: Left kidney lower pole benign cyst. Hypodense lesions in the right kidney are technically too small to characterize although statistically likely to be cysts. Stomach/Bowel: A tubular structure below the cecum separate from the terminal ileum and likely representing inflamed appendix measures up to 1.5 cm in diameter. There is an adjacent collection of gas and fluid measuring 3.4 by 1.9 by 2.6 cm compatible with periappendiceal abscess. The extraluminal gas appears contained and I do not see any other extraluminal gas or truly free air. Vascular/Lymphatic: Infrarenal abdominal aortic aneurysm 3.1 cm transverse on image 83/6. Aortoiliac atherosclerotic vascular disease. Reproductive: Unremarkable Other: No supplemental non-categorized findings. Musculoskeletal: Lumbar scoliosis, spondylosis, and degenerative disc disease. IMPRESSION: 1. Acute appendicitis with periappendiceal stranding and a periappendiceal abscess containing gas and fluid measuring up to 3.4 cm in diameter, favoring early rupture. Aside from this abscess there is no other extraluminal gas. 2. 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 3. Infrarenal abdominal aortic aneurysm 3.1 cm in diameter. Recommend followup by [place] in 3 years. This recommendation follows ACR consensus guidelines: White Paper of the ACR Incidental Findings Committee II on Vascular Findings. J Am Coll Radiol 2013; 59:563-875 4. Other imaging findings of potential clinical significance: Aortic Atherosclerosis (ICD10-I70.0). Lumbar scoliosis, spondylosis, and degenerative disc disease. Right coronary artery atherosclerosis. Electronically Signed   By: Van Clines M.D.   On: 07/08/2018 16:54    Anti-infectives: Anti-infectives (From admission, onward)   Start     Dose/Rate Route Frequency Ordered Stop   07/09/18 0700  piperacillin-tazobactam (ZOSYN) IVPB 3.375 g     3.375 g 12.5 mL/hr over 240 Minutes Intravenous Every 8  hours 07/09/18 0645     07/08/18 1715  piperacillin-tazobactam (ZOSYN) IVPB 3.375 g     3.375 g 12.5 mL/hr over 240 Minutes Intravenous  Once 07/08/18 1711 07/08/18 2319       Assessment/Plan HTN Hypothyroidism Recent Hx CVA  IBS Chronic constipation Psoriasis  Melena - will need further GI workup after acute appendicitis improves, hct/hct normal, CEA pending.  Acute perforated appendicitis  - CT w/ a 3.4 x 1.9 x 2.6 cm fluid collection, per IR no good window for drainage - afebrile, VSS, WBC 11.5 on 10/28 - IV Zosyn - continue NPO/bowel rest today and follow abdominal exam. - OOB/mobilize  FEN - NPO, IVF  ID - Zosyn 10.28 >>  VTE - SCD's, ok for chemical VTE from surgical perspective   LOS: 1 day    Obie Dredge, [facility] 2 Surgery Pager: [phone]

## 2018-07-10 DIAGNOSIS — K3533 Acute appendicitis with perforation and localized peritonitis, with abscess: Principal | ICD-10-CM

## 2018-07-10 DIAGNOSIS — J8410 Pulmonary fibrosis, unspecified: Secondary | ICD-10-CM

## 2018-07-10 DIAGNOSIS — E7800 Pure hypercholesterolemia, unspecified: Secondary | ICD-10-CM

## 2018-07-10 DIAGNOSIS — G2581 Restless legs syndrome: Secondary | ICD-10-CM

## 2018-07-10 DIAGNOSIS — F17200 Nicotine dependence, unspecified, uncomplicated: Secondary | ICD-10-CM

## 2018-07-10 DIAGNOSIS — I739 Peripheral vascular disease, unspecified: Secondary | ICD-10-CM

## 2018-07-10 LAB — URINE CULTURE: Culture: NO GROWTH

## 2018-07-10 LAB — CEA: CEA: 2.7 ng/mL (ref 0.0–4.7)

## 2018-07-10 MED ORDER — POLYETHYLENE GLYCOL 3350 17 G PO PACK
17.0000 g | PACK | Freq: Every day | ORAL | Status: DC
  Administered 2018-07-10 – 2018-07-16 (×5): 17 g via ORAL
  Filled 2018-07-10 (×7): qty 1

## 2018-07-10 MED ORDER — MORPHINE SULFATE (PF) 2 MG/ML IV SOLN
1.0000 mg | INTRAVENOUS | Status: DC | PRN
  Administered 2018-07-14 – 2018-07-15 (×3): 2 mg via INTRAVENOUS
  Filled 2018-07-10 (×3): qty 1

## 2018-07-10 NOTE — Progress Notes (Signed)
Pt states HA relieved with aromatherapy and Toradol, she states that tylenol does not work for her.

## 2018-07-10 NOTE — Progress Notes (Signed)
Pt c/o HA, states tylenol does not work.  Assessed for Aromatherapy and pt likes using essential oils.  Peppermint/Lavender used, will reassess.

## 2018-07-10 NOTE — Progress Notes (Signed)
Patient ID: [name], female   DOB: 1945-10-08, 72 y.o.   MRN: [id number]       Subjective: Patient c/o HA this morning.  She has been refusing her tylenol and didn't understand that toradol is similar to aleve, which she takes for her HAs at home.  Says her abdominal pain is about the same.  Tolerated clear liquids with no nausea or vomiting.  Says she's hungry.  C/O chronic constipation and wants her miralax added.  Objective: Vital signs in last 24 hours: Temp:  [97.7 F (36.5 C)-99 F (37.2 C)] 98.3 F (36.8 C) (10/30 0442) Pulse Rate:  [67-75] 67 (10/30 0442) Resp:  [16-18] 18 (10/30 0442) BP: (103-156)/(65-86) 156/71 (10/30 0442) SpO2:  [89 %-96 %] 89 % (10/30 0442) Last BM Date: 07/07/18  Intake/Output from previous day: 10/29 0701 - 10/30 0700 In: 1889.7 [P.O.:610; I.V.:1173.4; IV Piggyback:106.3] Out: -  Intake/Output this shift: Total I/O In: 100 [P.O.:100] Out: 600 [Urine:600]  PE: Heart: regular Lungs: CTAB Abd: soft, still quite tender in RLQ and voluntarily guards and hits my hand away to palpation, but otherwise benign exam, +BS, ND, but some fullness in lower abdomen.  Lab Results:  Recent Labs    07/08/18 1344 07/09/18 0757  WBC 11.5* 13.3*  HGB 12.6 12.0  HCT 40.8 39.0  PLT 252 213   BMET Recent Labs    07/08/18 1344 07/09/18 0757  NA 132* 133*  K 4.0 3.6  CL 96* 98  CO2 29 27  GLUCOSE 128* 143*  BUN 8 6*  CREATININE 0.81 1.02*  CALCIUM 8.8* 8.4*   PT/INR Recent Labs    07/09/18 0757  LABPROT 14.6  INR 1.15   CMP     Component Value Date/Time   NA 133 (L) 07/09/2018 0757   NA 139 03/05/2018 1758   K 3.6 07/09/2018 0757   CL 98 07/09/2018 0757   CO2 27 07/09/2018 0757   GLUCOSE 143 (H) 07/09/2018 0757   BUN 6 (L) 07/09/2018 0757   BUN 13 03/05/2018 1758   CREATININE 1.02 (H) 07/09/2018 0757   CREATININE 0.77 07/28/2015 1805   CALCIUM 8.4 (L) 07/09/2018 0757   PROT 6.1 (L) 07/09/2018 0757   PROT 7.4 03/05/2018 1758   ALBUMIN 3.0 (L) 07/09/2018 0757   ALBUMIN 4.4 03/05/2018 1758   AST 12 (L) 07/09/2018 0757   ALT 9 07/09/2018 0757   ALKPHOS 65 07/09/2018 0757   BILITOT 0.6 07/09/2018 0757   BILITOT 0.3 03/05/2018 1758   GFRNONAA 53 (L) 07/09/2018 0757   GFRAA >60 07/09/2018 0757   Lipase     Component Value Date/Time   LIPASE 22 07/08/2018 1344       Studies/Results: Dg Chest 2 View  Result Date: 07/08/2018 CLINICAL DATA:  Cough for a year. History of chronic interstitial lung disease bronchitis. EXAM: CHEST - 2 VIEW COMPARISON:  Chest radiograph October 24, 2017 and CT chest Jan 20, 2016. FINDINGS: Similar chronic interstitial changes confluent in lung bases. No pleural effusion or focal consolidation. Cardiomediastinal silhouette is normal. Calcified aortic arch. No pneumothorax. Soft tissue planes and included osseous structures are non suspicious. Moderate degenerative change of the thoracic spine. IMPRESSION: Chronic interstitial changes without focal consolidation. Aortic Atherosclerosis (ICD10-I70.0). Electronically Signed   By: Elon Alas M.D.   On: 07/08/2018 15:03   Ct Abdomen Pelvis W Contrast  Result Date: 07/08/2018 CLINICAL DATA:  Progressive right lower quadrant abdominal pain with fever and chills. EXAM: CT ABDOMEN AND PELVIS WITH  CONTRAST TECHNIQUE: Multidetector CT imaging of the abdomen and pelvis was performed using the standard protocol following bolus administration of intravenous contrast. CONTRAST:  41mL OMNIPAQUE IOHEXOL 300 MG/ML SOLN, 20mL OMNIPAQUE IOHEXOL 300 MG/ML SOLN COMPARISON:  Multiple exams, including 11/05/2009 FINDINGS: Lower chest: Mild peripheral interstitial accentuation in the lung bases favoring the lower lobes, new compared to 11/05/2009 and mildly progressive compared to 01/20/2016, with subpleural reticulation suggesting UIP. Descending thoracic aortic atherosclerotic calcification. Right coronary artery atherosclerotic calcification. Hepatobiliary:  Unremarkable Pancreas: Unremarkable Spleen: Unremarkable Adrenals/Urinary Tract: Left kidney lower pole benign cyst. Hypodense lesions in the right kidney are technically too small to characterize although statistically likely to be cysts. Stomach/Bowel: A tubular structure below the cecum separate from the terminal ileum and likely representing inflamed appendix measures up to 1.5 cm in diameter. There is an adjacent collection of gas and fluid measuring 3.4 by 1.9 by 2.6 cm compatible with periappendiceal abscess. The extraluminal gas appears contained and I do not see any other extraluminal gas or truly free air. Vascular/Lymphatic: Infrarenal abdominal aortic aneurysm 3.1 cm transverse on image 83/6. Aortoiliac atherosclerotic vascular disease. Reproductive: Unremarkable Other: No supplemental non-categorized findings. Musculoskeletal: Lumbar scoliosis, spondylosis, and degenerative disc disease. IMPRESSION: 1. Acute appendicitis with periappendiceal stranding and a periappendiceal abscess containing gas and fluid measuring up to 3.4 cm in diameter, favoring early rupture. Aside from this abscess there is no other extraluminal gas. 2. 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 3. Infrarenal abdominal aortic aneurysm 3.1 cm in diameter. Recommend followup by [place] in 3 years. This recommendation follows ACR consensus guidelines: White Paper of the ACR Incidental Findings Committee II on Vascular Findings. J Am Coll Radiol 2013; 30:092-330 4. Other imaging findings of potential clinical significance: Aortic Atherosclerosis (ICD10-I70.0). Lumbar scoliosis, spondylosis, and degenerative disc disease. Right coronary artery atherosclerosis. Electronically Signed   By: Van Clines M.D.   On: 07/08/2018 16:54    Anti-infectives: Anti-infectives (From admission, onward)   Start     Dose/Rate Route Frequency Ordered Stop   07/09/18 0700  piperacillin-tazobactam (ZOSYN) IVPB 3.375 g      3.375 g 12.5 mL/hr over 240 Minutes Intravenous Every 8 hours 07/09/18 0645     07/08/18 1715  piperacillin-tazobactam (ZOSYN) IVPB 3.375 g     3.375 g 12.5 mL/hr over 240 Minutes Intravenous  Once 07/08/18 1711 07/08/18 2319       Assessment/Plan HTN Hypothyroidism Recent Hx CVA  IBS Chronic constipation Psoriasis  Melena - will need further GI workup after acute appendicitis improves, hct/hct normal, CEA pending.  Acute perforated appendicitis  - CT w/ a 3.4 x 1.9 x 2.6 cm fluid collection, per IR no good window for drainage - afebrile, VSS, WBC 13 on 10/29, will recheck in am - IV Zosyn - may try some full liquids and see how she does given she tolerated clears well, but will go slowly given she still has quite a bit of RLQ pain -cont scheduled tylenol and encouraged patient to take this despite she thinks "it's useless."  I encouraged the toradol to help with her HA and switched her dilaudid to morphine incase that was causing her HA as well. -add miralax for her chronic constipation  FEN - FLD, IVF  ID - Zosyn 10.28 >>  VTE - SCD's, ok for chemical VTE from surgical perspective   LOS: 2 days    Henreitta Cea , [facility] 07/10/2018, 8:14 AM Pager: [phone]

## 2018-07-10 NOTE — Progress Notes (Signed)
PROGRESS NOTE  [name]  ATF:[id number] DOB: May 01, 1946 DOA: 07/08/2018 PCP: [name], MD   Brief Narrative: [name] is a 72 y.o. female with a history of IBS-C, HTN, hypothyroidism, hx CVA/TIA, and bipolar disorder who rpesented 10/28 with 2 weeks of progressively worsening abdominal cramping/pain becoming increasingly localized to the RLQ found to have ruptured appendicitis with small abscess. Surgery is consulted and recommending conservative therapy for now.   Assessment & Plan: Principal Problem:   Appendiceal abscess Active Problems:   RLS (restless legs syndrome)   Postinflammatory pulmonary fibrosis (HCC)   Smoker   Hyperlipidemia   PAD (peripheral artery disease) (HCC)  Acute perforated appendicitis with abscess: Not amenable to drainage per IR.  - Continue IVF, continue bowel rest and IV zosyn  - Appreciate general surgery recommendations  IBS-C:  - Restart home miralax, ok per surgery  Epigastric/chest pain: Not consistent with ACS, resolved.  - Continue PPI  HTN:  - Continue norvasc, holding ARB for now  Bipolar disorder: Stable. - Continue cymbalta, clonazepam, lamictal  History of CVA, TIA and PAD:  - Continue ASA, statin.  DVT prophylaxis: SCDs Code Status: Full Family Communication: None at bedside Disposition Plan: Pending clinical improvement  Consultants:   General surgery  IR  Procedures:   None  Antimicrobials:  Zosyn   Subjective: Feels about the same. Wants to advance diet and have a BM. +Flatus.   Objective: Vitals:   07/09/18 1430 07/09/18 1538 07/09/18 2128 07/10/18 0442  BP: (!) 155/74 133/65 (!) 150/71 (!) 156/71  Pulse: 75 71 75 67  Resp: 16 16 18 18   Temp: 97.7 F (36.5 C) 98.5 F (36.9 C) 98.6 F (37 C) 98.3 F (36.8 C)  TempSrc: Oral Oral Oral Oral  SpO2: 93% 96% (!) 89% (!) 89%  Weight:      Height:        Intake/Output Summary (Last 24 hours) at 07/10/2018 1714 Last data filed at  07/10/2018 0800 Gross per 24 hour  Intake 1739.67 ml  Output 600 ml  Net 1139.67 ml   Filed Weights   07/08/18 2124  Weight: 70.3 kg    Gen: 72 y.o. female in no distress  Pulm: Non-labored breathing. Clear to auscultation bilaterally.  CV: Regular rate and rhythm. No murmur, rub, or gallop. No JVD, no pedal edema. GI: Abdomen soft, tender worst in right anterior lower abdomen without distention or rebound. +BS Ext: Warm, no deformities Skin: No rashes, lesions or ulcers Neuro: Alert and oriented. No focal neurological deficits. Psych: Judgement and insight appear normal. Mood & affect appropriate.   Data Reviewed: I have personally reviewed following labs and imaging studies  CBC: Recent Labs  Lab 07/08/18 1344 07/09/18 0757  WBC 11.5* 13.3*  HGB 12.6 12.0  HCT 40.8 39.0  MCV 92.7 93.1  PLT 252 270   Basic Metabolic Panel: Recent Labs  Lab 07/08/18 1344 07/09/18 0757  NA 132* 133*  K 4.0 3.6  CL 96* 98  CO2 29 27  GLUCOSE 128* 143*  BUN 8 6*  CREATININE 0.81 1.02*  CALCIUM 8.8* 8.4*   GFR: Estimated Creatinine Clearance: 47.1 mL/min (A) (by C-G formula based on SCr of 1.02 mg/dL (H)). Liver Function Tests: Recent Labs  Lab 07/08/18 1344 07/09/18 0757  AST 15 12*  ALT 11 9  ALKPHOS 66 65  BILITOT 0.8 0.6  PROT 7.0 6.1*  ALBUMIN 3.6 3.0*   Recent Labs  Lab 07/08/18 1344  LIPASE 22  No results for input(s): AMMONIA in the last 168 hours. Coagulation Profile: Recent Labs  Lab 07/09/18 0757  INR 1.15   Cardiac Enzymes: Recent Labs  Lab 07/09/18 2024  TROPONINI <0.03   BNP (last 3 results) No results for input(s): PROBNP in the last 8760 hours. HbA1C: No results for input(s): HGBA1C in the last 72 hours. CBG: No results for input(s): GLUCAP in the last 168 hours. Lipid Profile: No results for input(s): CHOL, HDL, LDLCALC, TRIG, CHOLHDL, LDLDIRECT in the last 72 hours. Thyroid Function Tests: No results for input(s): TSH, T4TOTAL,  FREET4, T3FREE, THYROIDAB in the last 72 hours. Anemia Panel: No results for input(s): VITAMINB12, FOLATE, FERRITIN, TIBC, IRON, RETICCTPCT in the last 72 hours. Urine analysis:    Component Value Date/Time   COLORURINE YELLOW 07/08/2018 [place] 07/08/2018 2231   LABSPEC 1.025 07/08/2018 2231   PHURINE 7.0 07/08/2018 2231   GLUCOSEU NEGATIVE 07/08/2018 2231   HGBUR SMALL (A) 07/08/2018 2231   BILIRUBINUR NEGATIVE 07/08/2018 2231   BILIRUBINUR neg 07/31/2013 1752   KETONESUR NEGATIVE 07/08/2018 2231   PROTEINUR NEGATIVE 07/08/2018 2231   UROBILINOGEN 0.2 07/31/2013 1752   NITRITE NEGATIVE 07/08/2018 2231   LEUKOCYTESUR NEGATIVE 07/08/2018 2231   Recent Results (from the past 240 hour(s))  Urine culture     Status: None   Collection Time: 07/08/18 10:22 PM  Result Value Ref Range Status   Specimen Description URINE, RANDOM  Final   Special Requests NONE  Final   Culture   Final    NO GROWTH Performed at [facility], [place] [address], [place], [place] [address]    Report Status 07/10/2018 FINAL  Final      Radiology Studies: No results found.  Scheduled Meds: . acetaminophen  650 mg Oral Q6H  . amLODipine  10 mg Oral Daily  . aspirin  81 mg Oral Daily  . atorvastatin  40 mg Oral q1800  . clonazePAM  0.5 mg Oral QHS  . DULoxetine  30 mg Oral QHS  . DULoxetine  60 mg Oral BH-q7a  . lamoTRIgine  100 mg Oral Daily  . pantoprazole  40 mg Oral Daily  . polyethylene glycol  17 g Oral Daily  . rOPINIRole  2 mg Oral QHS   Continuous Infusions: . dextrose 5 % and 0.45% NaCl 125 mL/hr at 07/09/18 2030  . piperacillin-tazobactam (ZOSYN)  IV 3.375 g (07/10/18 1559)     LOS: 2 days   Time spent: 25 minutes.  Patrecia Pour, MD [facility] [email or web address] Password [email or web address] 07/10/2018, 5:14 PM

## 2018-07-10 NOTE — Progress Notes (Signed)
Patient wants miralax for regular bowel regimen she uses for IBS chronic constipation. Explained that nurse paged provider K. Schorr last night, and with current infection it is not recommended.  Patient states she "will have my husband bring my medicines from home to take."  Reminded patient the doctors need to guide care while in the hospital, urged to talk to them on rounds this am.    Complaining of headache as well. Takes Aleve daily at home, explained Toradol is same family of med, refuses scheduled Tylenol.

## 2018-07-10 NOTE — Plan of Care (Signed)
  Problem: Education: Goal: Knowledge of General Education information will improve Description: Including pain rating scale, medication(s)/side effects and non-pharmacologic comfort measures Outcome: Progressing   Problem: Health Behavior/Discharge Planning: Goal: Ability to manage health-related needs will improve Outcome: Progressing   Problem: Activity: Goal: Risk for activity intolerance will decrease Outcome: Progressing   

## 2018-07-11 LAB — CBC
HEMATOCRIT: 35.3 % — AB (ref 36.0–46.0)
Hemoglobin: 10.9 g/dL — ABNORMAL LOW (ref 12.0–15.0)
MCH: 28.2 pg (ref 26.0–34.0)
MCHC: 30.9 g/dL (ref 30.0–36.0)
MCV: 91.5 fL (ref 80.0–100.0)
NRBC: 0 % (ref 0.0–0.2)
Platelets: 208 10*3/uL (ref 150–400)
RBC: 3.86 MIL/uL — AB (ref 3.87–5.11)
RDW: 13.1 % (ref 11.5–15.5)
WBC: 9.2 10*3/uL (ref 4.0–10.5)

## 2018-07-11 NOTE — Progress Notes (Signed)
PROGRESS NOTE  [name]  VQQ:[id number] DOB: Jan 11, 1946 DOA: 07/08/2018 PCP: [name], MD   Brief Narrative: [name] is a 72 y.o. female with a history of IBS-C, HTN, hypothyroidism, hx CVA/TIA, and bipolar disorder who rpesented 10/28 with 2 weeks of progressively worsening abdominal cramping/pain becoming increasingly localized to the RLQ found to have ruptured appendicitis with small abscess. Surgery is consulted and recommending conservative therapy for now. Overall patient appears to be stabilizing.  Assessment & Plan: Principal Problem:   Appendiceal abscess Active Problems:   RLS (restless legs syndrome)   Postinflammatory pulmonary fibrosis (HCC)   Smoker   Hyperlipidemia   PAD (peripheral artery disease) (HCC)  Acute perforated appendicitis with abscess: Not amenable to drainage per IR.  - Continue diet per surgery - Continue zosyn, leukocytosis resolved, afebrile, pain stable.  - Appreciate general surgery recommendations  IBS-C:  - Restarted home miralax, constipation resolved. - Minimize narcotics as able.  Epigastric/chest pain: Not consistent with ACS, resolved.  - Continue PPI  HTN:  - Continue norvasc, holding ARB for now. Recheck Cr in AM, if improved will restart ARB  Bipolar disorder: Stable. - Continue cymbalta, clonazepam, lamictal  History of CVA, TIA and PAD:  - Continue ASA, statin.  DVT prophylaxis: SCDs Code Status: Full Family Communication: None at bedside Disposition Plan: Pending clinical improvement  Consultants:   General surgery  IR  Procedures:   None  Antimicrobials:  Zosyn   Subjective: Abdominal pain generally improved modestly over past 24-48hrs, definitely not worse. Provoked by most movements, not by po intake or BM (large, dark last night). Denies bleeding or fever.  Objective: Vitals:   07/10/18 0442 07/10/18 1700 07/10/18 2110 07/11/18 0527  BP: (!) 156/71 (!) 156/77 (!) 154/72 (!) 172/78    Pulse: 67 82 77 68  Resp: 18  16 16   Temp: 98.3 F (36.8 C) 98.4 F (36.9 C) 98.2 F (36.8 C) 98.6 F (37 C)  TempSrc: Oral Oral Oral Oral  SpO2: (!) 89% 93% 95% 95%  Weight:      Height:        Intake/Output Summary (Last 24 hours) at 07/11/2018 1531 Last data filed at 07/11/2018 0900 Gross per 24 hour  Intake 3601.07 ml  Output -  Net 3601.07 ml   Filed Weights   07/08/18 2124  Weight: 70.3 kg   Gen: 72 y.o. female in no distress Pulm: Nonlabored breathing room air. Clear. CV: Regular rate and rhythm. No murmur, rub, or gallop. No JVD, no dependent edema. GI: Abdomen soft, RLQ tenderness improved from prior, nondistended, +BS.  Ext: Warm, no deformities Skin: No rashes, lesions or ulcers on visualized skin. Marland Kitchen  Neuro: Alert and oriented. No focal neurological deficits. Psych: Judgement and insight appear fair. Mood euthymic & affect congruent. Behavior is appropriate.    Data Reviewed: I have personally reviewed following labs and imaging studies  CBC: Recent Labs  Lab 07/08/18 1344 07/09/18 0757 07/11/18 0239  WBC 11.5* 13.3* 9.2  HGB 12.6 12.0 10.9*  HCT 40.8 39.0 35.3*  MCV 92.7 93.1 91.5  PLT 252 213 433   Basic Metabolic Panel: Recent Labs  Lab 07/08/18 1344 07/09/18 0757  NA 132* 133*  K 4.0 3.6  CL 96* 98  CO2 29 27  GLUCOSE 128* 143*  BUN 8 6*  CREATININE 0.81 1.02*  CALCIUM 8.8* 8.4*   GFR: Estimated Creatinine Clearance: 47.1 mL/min (A) (by C-G formula based on SCr of 1.02 mg/dL (H)). Liver  Function Tests: Recent Labs  Lab 07/08/18 1344 07/09/18 0757  AST 15 12*  ALT 11 9  ALKPHOS 66 65  BILITOT 0.8 0.6  PROT 7.0 6.1*  ALBUMIN 3.6 3.0*   Recent Labs  Lab 07/08/18 1344  LIPASE 22   No results for input(s): AMMONIA in the last 168 hours. Coagulation Profile: Recent Labs  Lab 07/09/18 0757  INR 1.15   Cardiac Enzymes: Recent Labs  Lab 07/09/18 2024  TROPONINI <0.03   BNP (last 3 results) No results for input(s):  PROBNP in the last 8760 hours. HbA1C: No results for input(s): HGBA1C in the last 72 hours. CBG: No results for input(s): GLUCAP in the last 168 hours. Lipid Profile: No results for input(s): CHOL, HDL, LDLCALC, TRIG, CHOLHDL, LDLDIRECT in the last 72 hours. Thyroid Function Tests: No results for input(s): TSH, T4TOTAL, FREET4, T3FREE, THYROIDAB in the last 72 hours. Anemia Panel: No results for input(s): VITAMINB12, FOLATE, FERRITIN, TIBC, IRON, RETICCTPCT in the last 72 hours. Urine analysis:    Component Value Date/Time   COLORURINE YELLOW 07/08/2018 [place] 07/08/2018 2231   LABSPEC 1.025 07/08/2018 2231   PHURINE 7.0 07/08/2018 2231   GLUCOSEU NEGATIVE 07/08/2018 2231   HGBUR SMALL (A) 07/08/2018 2231   BILIRUBINUR NEGATIVE 07/08/2018 2231   BILIRUBINUR neg 07/31/2013 1752   KETONESUR NEGATIVE 07/08/2018 2231   PROTEINUR NEGATIVE 07/08/2018 2231   UROBILINOGEN 0.2 07/31/2013 1752   NITRITE NEGATIVE 07/08/2018 2231   LEUKOCYTESUR NEGATIVE 07/08/2018 2231   Recent Results (from the past 240 hour(s))  Urine culture     Status: None   Collection Time: 07/08/18 10:22 PM  Result Value Ref Range Status   Specimen Description URINE, RANDOM  Final   Special Requests NONE  Final   Culture   Final    NO GROWTH Performed at [facility], [place] [address], [place], [place] [address]    Report Status 07/10/2018 FINAL  Final      Radiology Studies: No results found.  Scheduled Meds: . acetaminophen  650 mg Oral Q6H  . amLODipine  10 mg Oral Daily  . aspirin  81 mg Oral Daily  . atorvastatin  40 mg Oral q1800  . clonazePAM  0.5 mg Oral QHS  . DULoxetine  30 mg Oral QHS  . DULoxetine  60 mg Oral BH-q7a  . lamoTRIgine  100 mg Oral Daily  . pantoprazole  40 mg Oral Daily  . polyethylene glycol  17 g Oral Daily  . rOPINIRole  2 mg Oral QHS   Continuous Infusions: . dextrose 5 % and 0.45% NaCl 125 mL/hr at 07/11/18 0624  . piperacillin-tazobactam  (ZOSYN)  IV 3.375 g (07/11/18 0626)     LOS: 3 days   Time spent: 25 minutes.  Patrecia Pour, MD [facility] [email or web address] Password [email or web address] 07/11/2018, 3:31 PM

## 2018-07-11 NOTE — Progress Notes (Signed)
[facility] Progress Note     Subjective: CC:  Reports feeling better today. C/o bilateral neck soreness. Denies abd pain at rest but experiences soreness/pain with movement. Pain is unchanged by PO intake. Denies nausea or vomiting. Reports a watery, dark BM yesterday. Reports ambulating in her room and getting up to chair - plans to take a walk today. Objective: Vital signs in last 24 hours: Temp:  [98.2 F (36.8 C)-98.6 F (37 C)] 98.6 F (37 C) (10/31 0527) Pulse Rate:  [68-82] 68 (10/31 0527) Resp:  [16] 16 (10/31 0527) BP: (154-172)/(72-78) 172/78 (10/31 0527) SpO2:  [93 %-95 %] 95 % (10/31 0527) Last BM Date: 07/07/18  Intake/Output from previous day: 10/30 0701 - 10/31 0700 In: 3221.1 [P.O.:500; I.V.:2645.9; IV Piggyback:75.1] Out: 600 [Urine:600] Intake/Output this shift: No intake/output data recorded.  PE: Gen:  Alert, NAD, pleasant Card:  Regular rate and rhythm, pedal pulses 2+ BL Pulm:  Normal effort, clear to auscultation bilaterally Abd: Soft, mild distention, focal RLQ tenderness with guarding. No peritonitis. +BS all 4 quadrants. Skin: warm and dry, no rashes  Psych: A&Ox3   Lab Results:  Recent Labs    07/09/18 0757 07/11/18 0239  WBC 13.3* 9.2  HGB 12.0 10.9*  HCT 39.0 35.3*  PLT 213 208   BMET Recent Labs    07/08/18 1344 07/09/18 0757  NA 132* 133*  K 4.0 3.6  CL 96* 98  CO2 29 27  GLUCOSE 128* 143*  BUN 8 6*  CREATININE 0.81 1.02*  CALCIUM 8.8* 8.4*   PT/INR Recent Labs    07/09/18 0757  LABPROT 14.6  INR 1.15   CMP     Component Value Date/Time   NA 133 (L) 07/09/2018 0757   NA 139 03/05/2018 1758   K 3.6 07/09/2018 0757   CL 98 07/09/2018 0757   CO2 27 07/09/2018 0757   GLUCOSE 143 (H) 07/09/2018 0757   BUN 6 (L) 07/09/2018 0757   BUN 13 03/05/2018 1758   CREATININE 1.02 (H) 07/09/2018 0757   CREATININE 0.77 07/28/2015 1805   CALCIUM 8.4 (L) 07/09/2018 0757   PROT 6.1 (L) 07/09/2018 0757   PROT 7.4  03/05/2018 1758   ALBUMIN 3.0 (L) 07/09/2018 0757   ALBUMIN 4.4 03/05/2018 1758   AST 12 (L) 07/09/2018 0757   ALT 9 07/09/2018 0757   ALKPHOS 65 07/09/2018 0757   BILITOT 0.6 07/09/2018 0757   BILITOT 0.3 03/05/2018 1758   GFRNONAA 53 (L) 07/09/2018 0757   GFRAA >60 07/09/2018 0757   Lipase     Component Value Date/Time   LIPASE 22 07/08/2018 1344       Studies/Results: No results found.  Anti-infectives: Anti-infectives (From admission, onward)   Start     Dose/Rate Route Frequency Ordered Stop   07/09/18 0700  piperacillin-tazobactam (ZOSYN) IVPB 3.375 g     3.375 g 12.5 mL/hr over 240 Minutes Intravenous Every 8 hours 07/09/18 0645     07/08/18 1715  piperacillin-tazobactam (ZOSYN) IVPB 3.375 g     3.375 g 12.5 mL/hr over 240 Minutes Intravenous  Once 07/08/18 1711 07/08/18 2319       Assessment/Plan IBS-C Chest pain - resolved HTN hypothyroidism Bipolar disorder PMH CVA  PAD  Melena - will need further GI workup after acute appendicitis improves, hct/hct normal, CEA 2.7.  Acute perforated appendicitis with abscess  - CT w/ a 3.4 x 1.9 x 2.6 cm fluid collection, per IR no good window for drainage - afebrile, VSS, Leukocytosis  resolved  - IV Zosyn -advance to SOFT - pt refusing tylenol but taking toradol, is requiring less IV pain meds.  - if patient continues to improve then I anticipate she will be stable for discharge on PO abx over the weekend.  FEN - SOFT, IVF  ID - Zosyn 10.28 >> VTE - SCD's,start chemical VTE     LOS: 3 days    Obie Dredge, [facility] Pager: [phone]

## 2018-07-12 LAB — BASIC METABOLIC PANEL
Anion gap: 10 (ref 5–15)
BUN: 5 mg/dL — ABNORMAL LOW (ref 8–23)
CALCIUM: 8.6 mg/dL — AB (ref 8.9–10.3)
CO2: 28 mmol/L (ref 22–32)
CREATININE: 0.85 mg/dL (ref 0.44–1.00)
Chloride: 97 mmol/L — ABNORMAL LOW (ref 98–111)
GFR calc Af Amer: >60 mL/min (ref >60)
GFR calc non Af Amer: >60 mL/min (ref >60)
GLUCOSE: 108 mg/dL — AB (ref 70–99)
Potassium: 3 mmol/L — ABNORMAL LOW (ref 3.5–5.1)
Sodium: 135 mmol/L (ref 135–145)

## 2018-07-12 MED ORDER — NICOTINE 21 MG/24HR TD PT24
21.0000 mg | MEDICATED_PATCH | Freq: Every day | TRANSDERMAL | Status: DC
  Administered 2018-07-12 – 2018-07-16 (×5): 21 mg via TRANSDERMAL
  Filled 2018-07-12 (×5): qty 1

## 2018-07-12 MED ORDER — LOSARTAN POTASSIUM 50 MG PO TABS
50.0000 mg | ORAL_TABLET | Freq: Every day | ORAL | Status: DC
  Administered 2018-07-12 – 2018-07-16 (×4): 50 mg via ORAL
  Filled 2018-07-12 (×4): qty 1

## 2018-07-12 MED ORDER — POTASSIUM CHLORIDE CRYS ER 20 MEQ PO TBCR
30.0000 meq | EXTENDED_RELEASE_TABLET | Freq: Two times a day (BID) | ORAL | Status: AC
  Administered 2018-07-12 (×2): 30 meq via ORAL
  Filled 2018-07-12 (×2): qty 1

## 2018-07-12 MED ORDER — ENOXAPARIN SODIUM 40 MG/0.4ML ~~LOC~~ SOLN
40.0000 mg | SUBCUTANEOUS | Status: AC
  Administered 2018-07-12: 40 mg via SUBCUTANEOUS
  Filled 2018-07-12: qty 0.4

## 2018-07-12 NOTE — Care Management Important Message (Signed)
Important Message  Patient Details  Name: LANECIA SLIVA MRN: [id number] Date of Birth: Nov 15, 1945   Medicare Important Message Given:  Yes    Barb Merino Gerhart Ruggieri 07/12/2018, 4:11 PM

## 2018-07-12 NOTE — Progress Notes (Signed)
PROGRESS NOTE  MELANEY TELLEFSEN  YJE:[id number] DOB: 1946/09/04 DOA: 07/08/2018 PCP: [name], MD   Brief Narrative: MARSHEILA ALEJO is a 72 y.o. female with a history of IBS-C, HTN, hypothyroidism, hx CVA/TIA, and bipolar disorder who rpesented 10/28 with 2 weeks of progressively worsening abdominal cramping/pain becoming increasingly localized to the RLQ found to have ruptured appendicitis with small abscess. Surgery is consulted and recommending conservative therapy for now. Overall patient appears to be stabilizing. Repeat CT imaging planned 11/2.  Assessment & Plan: Principal Problem:   Appendiceal abscess Active Problems:   RLS (restless legs syndrome)   Postinflammatory pulmonary fibrosis (HCC)   Smoker   Hyperlipidemia   PAD (peripheral artery disease) (HCC)  Acute perforated appendicitis with abscess: Not amenable to drainage per IR.  - Continue diet per surgery, may advance today - Repeat CT tomorrow.  - Continue zosyn, leukocytosis resolved, afebrile, pain stable.  - Appreciate general surgery recommendations  IBS-C:  - Restarted home miralax, constipation resolved. - Minimize narcotics as able.  Epigastric/chest pain: Not consistent with ACS, resolved.  - Continue PPI  HTN:  - Continue norvasc, restart ARB  Hypokalemia: Replace and recheck in AM  Bipolar disorder: Stable. - Continue cymbalta, clonazepam, lamictal  History of CVA, TIA and PAD:  - Continue ASA, statin.  Tobacco use:  - Nicotine patch  DVT prophylaxis: SCDs Code Status: Full Family Communication: None at bedside Disposition Plan: Possibly DC home if improving clinically and with repeat CT 11/2.  Consultants:   General surgery  IR  Procedures:   None  Antimicrobials:  Zosyn 10/28 >>   Subjective: Continue to report improvement in abdominal pain, currently moderate, waxing/waning in RLQ, no worse with food/water or BMs. No fever.  Objective: Vitals:   07/11/18 1548  07/11/18 2213 07/12/18 0516 07/12/18 1347  BP: (!) 143/69 (!) 168/80 (!) 162/78 (!) 154/74  Pulse: 75 88 85 81  Resp: 18 18 17 20   Temp: 97.9 F (36.6 C) 98.4 F (36.9 C) 98.7 F (37.1 C) 98.1 F (36.7 C)  TempSrc: Oral Oral Oral Oral  SpO2: 100% 95% 98% 95%  Weight:      Height:        Intake/Output Summary (Last 24 hours) at 07/12/2018 1502 Last data filed at 07/12/2018 1016 Gross per 24 hour  Intake 3363.86 ml  Output -  Net 3363.86 ml   Filed Weights   07/08/18 2124  Weight: 70.3 kg   Gen: 72 y.o. female in no distress Pulm: Nonlabored breathing room air. Clear. CV: Regular rate and rhythm. No murmur, rub, or gallop. No JVD, no dependent edema. GI: Abdomen soft, improved tenderness in RLQ to deep palpation, +guarding, no rebound or distention. +BS  Ext: Warm, no deformities Skin: No rashes, lesions or ulcers on visualized skin. Marland Kitchen  Neuro: Alert and oriented. No focal neurological deficits. Psych: Judgement and insight appear fair. Mood euthymic & affect congruent. Behavior is appropriate.    Data Reviewed: I have personally reviewed following labs and imaging studies  CBC: Recent Labs  Lab 07/08/18 1344 07/09/18 0757 07/11/18 0239  WBC 11.5* 13.3* 9.2  HGB 12.6 12.0 10.9*  HCT 40.8 39.0 35.3*  MCV 92.7 93.1 91.5  PLT 252 213 637   Basic Metabolic Panel: Recent Labs  Lab 07/08/18 1344 07/09/18 0757 07/12/18 0146  NA 132* 133* 135  K 4.0 3.6 3.0*  CL 96* 98 97*  CO2 29 27 28   GLUCOSE 128* 143* 108*  BUN 8 6*  5*  CREATININE 0.81 1.02* 0.85  CALCIUM 8.8* 8.4* 8.6*   GFR: Estimated Creatinine Clearance: 56.5 mL/min (by C-G formula based on SCr of 0.85 mg/dL). Liver Function Tests: Recent Labs  Lab 07/08/18 1344 07/09/18 0757  AST 15 12*  ALT 11 9  ALKPHOS 66 65  BILITOT 0.8 0.6  PROT 7.0 6.1*  ALBUMIN 3.6 3.0*   Recent Labs  Lab 07/08/18 1344  LIPASE 22   No results for input(s): AMMONIA in the last 168 hours. Coagulation  Profile: Recent Labs  Lab 07/09/18 0757  INR 1.15   Cardiac Enzymes: Recent Labs  Lab 07/09/18 2024  TROPONINI <0.03   BNP (last 3 results) No results for input(s): PROBNP in the last 8760 hours. HbA1C: No results for input(s): HGBA1C in the last 72 hours. CBG: No results for input(s): GLUCAP in the last 168 hours. Lipid Profile: No results for input(s): CHOL, HDL, LDLCALC, TRIG, CHOLHDL, LDLDIRECT in the last 72 hours. Thyroid Function Tests: No results for input(s): TSH, T4TOTAL, FREET4, T3FREE, THYROIDAB in the last 72 hours. Anemia Panel: No results for input(s): VITAMINB12, FOLATE, FERRITIN, TIBC, IRON, RETICCTPCT in the last 72 hours. Urine analysis:    Component Value Date/Time   COLORURINE YELLOW 07/08/2018 [place] 07/08/2018 2231   LABSPEC 1.025 07/08/2018 2231   PHURINE 7.0 07/08/2018 2231   GLUCOSEU NEGATIVE 07/08/2018 2231   HGBUR SMALL (A) 07/08/2018 2231   BILIRUBINUR NEGATIVE 07/08/2018 2231   BILIRUBINUR neg 07/31/2013 1752   KETONESUR NEGATIVE 07/08/2018 2231   PROTEINUR NEGATIVE 07/08/2018 2231   UROBILINOGEN 0.2 07/31/2013 1752   NITRITE NEGATIVE 07/08/2018 2231   LEUKOCYTESUR NEGATIVE 07/08/2018 2231   Recent Results (from the past 240 hour(s))  Urine culture     Status: None   Collection Time: 07/08/18 10:22 PM  Result Value Ref Range Status   Specimen Description URINE, RANDOM  Final   Special Requests NONE  Final   Culture   Final    NO GROWTH Performed at [facility], [place] [address], [place], [place] [address]    Report Status 07/10/2018 FINAL  Final      Radiology Studies: No results found.  Scheduled Meds: . acetaminophen  650 mg Oral Q6H  . amLODipine  10 mg Oral Daily  . aspirin  81 mg Oral Daily  . atorvastatin  40 mg Oral q1800  . clonazePAM  0.5 mg Oral QHS  . DULoxetine  30 mg Oral QHS  . DULoxetine  60 mg Oral BH-q7a  . lamoTRIgine  100 mg Oral Daily  . nicotine  21 mg Transdermal Daily  .  pantoprazole  40 mg Oral Daily  . polyethylene glycol  17 g Oral Daily  . potassium chloride  30 mEq Oral BID  . rOPINIRole  2 mg Oral QHS   Continuous Infusions: . dextrose 5 % and 0.45% NaCl 125 mL/hr at 07/12/18 0941  . piperacillin-tazobactam (ZOSYN)  IV 3.375 g (07/12/18 1416)     LOS: 4 days   Time spent: 25 minutes.  Patrecia Pour, MD [facility] [email or web address] Password [email or web address] 07/12/2018, 3:02 PM

## 2018-07-12 NOTE — Progress Notes (Addendum)
[facility] Progress Note     Subjective: CC:  Feeling better. Abdominal pain improving and she was able to ambulate in the hall yesterday. Denies nausea or vomiting. Continues to have loose, dark stools. Reports odd dreams/hallicinations where she thinks she is talking to someone in the room but then wakes up and they are not actually in the room with her.   Objective: Vital signs in last 24 hours: Temp:  [97.9 F (36.6 C)-98.7 F (37.1 C)] 98.7 F (37.1 C) (11/01 0516) Pulse Rate:  [75-88] 85 (11/01 0516) Resp:  [17-18] 17 (11/01 0516) BP: (143-168)/(69-80) 162/78 (11/01 0516) SpO2:  [95 %-100 %] 98 % (11/01 0516) Last BM Date: 07/07/18  Intake/Output from previous day: 10/31 0701 - 11/01 0700 In: 3723.9 [P.O.:1320; I.V.:2290.4; IV Piggyback:113.5] Out: -  Intake/Output this shift: No intake/output data recorded.  PE: Gen:  Alert, NAD, pleasant Card:  Regular rate and rhythm, pedal pulses 2+ BL Pulm:  Normal effort, clear to auscultation bilaterally Abd: Soft, mild distention, focal RLQ tenderness significantly improved compared to previous exam. No peritonitis. +BS all 4 quadrants. Skin: warm and dry, no rashes  Psych: A&Ox3   Lab Results:  Recent Labs    07/11/18 0239  WBC 9.2  HGB 10.9*  HCT 35.3*  PLT 208   BMET Recent Labs    07/12/18 0146  NA 135  K 3.0*  CL 97*  CO2 28  GLUCOSE 108*  BUN 5*  CREATININE 0.85  CALCIUM 8.6*   PT/INR No results for input(s): LABPROT, INR in the last 72 hours. CMP     Component Value Date/Time   NA 135 07/12/2018 0146   NA 139 03/05/2018 1758   K 3.0 (L) 07/12/2018 0146   CL 97 (L) 07/12/2018 0146   CO2 28 07/12/2018 0146   GLUCOSE 108 (H) 07/12/2018 0146   BUN 5 (L) 07/12/2018 0146   BUN 13 03/05/2018 1758   CREATININE 0.85 07/12/2018 0146   CREATININE 0.77 07/28/2015 1805   CALCIUM 8.6 (L) 07/12/2018 0146   PROT 6.1 (L) 07/09/2018 0757   PROT 7.4 03/05/2018 1758   ALBUMIN 3.0 (L) 07/09/2018  0757   ALBUMIN 4.4 03/05/2018 1758   AST 12 (L) 07/09/2018 0757   ALT 9 07/09/2018 0757   ALKPHOS 65 07/09/2018 0757   BILITOT 0.6 07/09/2018 0757   BILITOT 0.3 03/05/2018 1758   GFRNONAA >60 07/12/2018 0146   GFRAA >60 07/12/2018 0146   Lipase     Component Value Date/Time   LIPASE 22 07/08/2018 1344       Studies/Results: No results found.  Anti-infectives: Anti-infectives (From admission, onward)   Start     Dose/Rate Route Frequency Ordered Stop   07/09/18 0700  piperacillin-tazobactam (ZOSYN) IVPB 3.375 g     3.375 g 12.5 mL/hr over 240 Minutes Intravenous Every 8 hours 07/09/18 0645     07/08/18 1715  piperacillin-tazobactam (ZOSYN) IVPB 3.375 g     3.375 g 12.5 mL/hr over 240 Minutes Intravenous  Once 07/08/18 1711 07/08/18 2319     Assessment/Plan IBS-C Chest pain - resolved HTN hypothyroidism Bipolar disorder PMH CVA  PAD  Melena - will need further GI workup after acute appendicitis improves, hct/hct normal, CEA 2.7.  Acute perforated appendicitis with abscess  - CT w/ a 3.4 x 1.9 x 2.6 cm fluid collection, per IR no good window for drainage - afebrile, VSS, Leukocytosis resolved  -  IV Zosyn - SOFT diet - repeat CT Abd/Pelvis tomorrow to  re-evaluate abscess - if patient continues to improve then I anticipate she will be stable for discharge on PO abx over the weekend.   FEN -SOFT, IVF  ID - Zosyn 10.28 >> VTE - SCD's,will order a dose of lovenox today but hold tomorrow incase CT reveals enlarging abscess that may need drainage    LOS: 4 days    Obie Dredge, [facility] Pager: [phone]

## 2018-07-12 NOTE — Evaluation (Signed)
Physical Therapy Evaluation Patient Details Name: [name] MRN: [id number] DOB: 26-Jun-1946 Today's Date: 07/12/2018   History of Present Illness  Patient is a 72 y/o female who presents with RLQ pain, found to have Acute perforated appendicitis with abscess. PMh includes stroke, HTN, CAD, bipolar disorder.   Clinical Impression  Patient presents with generalized weakness, deconditioning, impaired cognition and impaired balance s/p above. Tolerated transfers and gait training with close min guard assist with 2 episodes of complete LOB requiring mod A to prevent fall. Pt unaware of deficits/imbalance. Refused to use RW. Discussed importance of needing BUE support at this time to prevent falls. Needs to negotiate 1 flight of stairs to get to bedroom at home ideally. Spouse reports pt able to bring bed downstairs if needed. Pt independent and driving PTA. Reports hallucinations and talking nonsensical at times during session today. Will follow acutely to maximize independence and mobility prior to return home.    Follow Up Recommendations Supervision for mobility/OOB;Home health PT    Equipment Recommendations  Rolling walker with 5" wheels    Recommendations for Other Services       Precautions / Restrictions Precautions Precautions: Fall Restrictions Weight Bearing Restrictions: No      Mobility  Bed Mobility               General bed mobility comments: Sitting EOB upon PT arrival.   Transfers Overall transfer level: Needs assistance Equipment used: None Transfers: Sit to/from Stand Sit to Stand: Min guard         General transfer comment: close [place] for safety. Stood from EOB x1 reaching for something to hold onto.  Ambulation/Gait Ambulation/Gait assistance: Mod assist;Min guard Gait Distance (Feet): 200 Feet Assistive device: (rail in hallway) Gait Pattern/deviations: Step-through pattern;Decreased stride length;Staggering right;Staggering left;Drifts  right/left;Narrow base of support Gait velocity: decreased   General Gait Details: Slow unsteady gait with pt reaching for rail for support; LOB x2 requiring Mod A to prevent fall. Will NOT use RW.   Stairs            Wheelchair Mobility    Modified Rankin (Stroke Patients Only)       Balance Overall balance assessment: Needs assistance Sitting-balance support: No upper extremity supported;Feet supported Sitting balance-Leahy Scale: Good     Standing balance support: During functional activity Standing balance-Leahy Scale: Fair Standing balance comment: Varying degree of imbalance noted statically and dynamically.                             Pertinent Vitals/Pain Pain Assessment: No/denies pain    Home Living Family/patient expects to be discharged to:: Private residence Living Arrangements: Spouse/significant other Available Help at Discharge: Family;Available 24 hours/day Type of Home: House Home Access: Stairs to enter   [employer] of Steps: 2 Home Layout: Two level;Bed/bath upstairs Home Equipment: Shower seat - built in;Cane - single point;Crutches      Prior Function Level of Independence: Independent         Comments: was driving     Hand Dominance   Dominant Hand: Right    Extremity/Trunk Assessment   Upper Extremity Assessment Upper Extremity Assessment: Defer to OT evaluation    Lower Extremity Assessment Lower Extremity Assessment: Generalized weakness       Communication   Communication: No difficulties  Cognition Arousal/Alertness: Awake/alert Behavior During Therapy: WFL for tasks assessed/performed Overall Cognitive Status: Impaired/Different from baseline Area of Impairment: Awareness;Safety/judgement  Safety/Judgement: Decreased awareness of safety;Decreased awareness of deficits Awareness: Emergent   General Comments: A&Ox4. Pt reports having hallucinations. "on a  molecular level, the room has done a 360, and moved around." "so much pain in that room." Saying things that arent making any sense.       General Comments General comments (skin integrity, edema, etc.): Spouse and son in law present during session.     Exercises     Assessment/Plan    PT Assessment Patient needs continued PT services  PT Problem List Decreased strength;Decreased mobility;Decreased safety awareness;Decreased cognition;Decreased balance;Decreased knowledge of use of DME       PT Treatment Interventions DME instruction;Functional mobility training;Balance training;Patient/family education;Therapeutic activities;Stair training;Gait training;Therapeutic exercise    PT Goals (Current goals can be found in the Care Plan section)  Acute Rehab PT Goals Patient Stated Goal: to get out of here PT Goal Formulation: With patient Time For Goal Achievement: 07/26/18 Potential to Achieve Goals: Good    Frequency Min 3X/week   Barriers to discharge Inaccessible home environment stairs to get to bedroom; per spouse can bring bed downstairs if needed    Co-evaluation               AM-PAC PT "6 Clicks" Daily Activity  Outcome Measure Difficulty turning over in bed (including adjusting bedclothes, sheets and blankets)?: None Difficulty moving from lying on back to sitting on the side of the bed? : None Difficulty sitting down on and standing up from a chair with arms (e.g., wheelchair, bedside commode, etc,.)?: A Little Help needed moving to and from a bed to chair (including a wheelchair)?: A Little Help needed walking in hospital room?: A Little Help needed climbing 3-5 steps with a railing? : A Little 6 Click Score: 20    End of Session Equipment Utilized During Treatment: Gait belt Activity Tolerance: Patient tolerated treatment well Patient left: in bed;with call bell/phone within reach;with bed alarm set;with family/visitor present Nurse Communication: Mobility  status PT Visit Diagnosis: Unsteadiness on feet (R26.81);Difficulty in walking, not elsewhere classified (R26.2)    Time: 3474-2595 PT Time Calculation (min) (ACUTE ONLY): 18 min   Charges:   PT Evaluation $PT Eval Low Complexity: 1 Low          Wray Kearns, PT, DPT Acute Rehabilitation Services Pager [phone] Office [phone]      Marguarite Arbour A Sabra Heck 07/12/2018, 2:14 PM

## 2018-07-13 ENCOUNTER — Inpatient Hospital Stay (HOSPITAL_COMMUNITY): Payer: Medicare Other

## 2018-07-13 MED ORDER — POTASSIUM CHLORIDE CRYS ER 20 MEQ PO TBCR
40.0000 meq | EXTENDED_RELEASE_TABLET | Freq: Two times a day (BID) | ORAL | Status: AC
  Administered 2018-07-13 – 2018-07-14 (×2): 40 meq via ORAL
  Filled 2018-07-13: qty 2

## 2018-07-13 MED ORDER — IOHEXOL 300 MG/ML  SOLN
100.0000 mL | Freq: Once | INTRAMUSCULAR | Status: AC | PRN
  Administered 2018-07-13: 100 mL via INTRAVENOUS

## 2018-07-13 NOTE — Progress Notes (Signed)
Patient ID: [name], female   DOB: 1945-11-25, 72 y.o.   MRN: [id number] [facility] Surgery Progress Note:   * No surgery found *  Subjective: Mental status is clear.  Patient sitting up and feeling better Objective: Vital signs in last 24 hours: Temp:  [98.1 F (36.7 C)-98.8 F (37.1 C)] 98.8 F (37.1 C) (11/02 0442) Pulse Rate:  [81-88] 88 (11/02 0442) Resp:  [18-20] 18 (11/02 0442) BP: (138-154)/(70-74) 138/72 (11/02 0442) SpO2:  [95 %-99 %] 99 % (11/02 0442)  Intake/Output from previous day: 11/01 0701 - 11/02 0700 In: 1776.1 [P.O.:360; I.V.:1370.5; IV Piggyback:45.5] Out: -  Intake/Output this shift: Total I/O In: 1500 [I.V.:1500] Out: -   Physical Exam: Work of breathing is not labored; pain in abdomen is better  Lab Results:  Results for orders placed or performed during the hospital encounter of 07/08/18 (from the past 48 hour(s))  Basic metabolic panel     Status: Abnormal   Collection Time: 07/12/18  1:46 AM  Result Value Ref Range   Sodium 135 135 - 145 mmol/L   Potassium 3.0 (L) 3.5 - 5.1 mmol/L   Chloride 97 (L) 98 - 111 mmol/L   CO2 28 22 - 32 mmol/L   Glucose, Bld 108 (H) 70 - 99 mg/dL   BUN 5 (L) 8 - 23 mg/dL   Creatinine, Ser 0.85 0.44 - 1.00 mg/dL   Calcium 8.6 (L) 8.9 - 10.3 mg/dL   GFR calc non Af Amer >60 >60 mL/min   GFR calc Af Amer >60 >60 mL/min    Comment: (NOTE) The eGFR has been calculated using the CKD EPI equation. This calculation has not been validated in all clinical situations. eGFR's persistently <60 mL/min signify possible Chronic Kidney Disease.    Anion gap 10 5 - 15    Comment: Performed at [facility] [address] [place], [place] [address]    Radiology/Results: No results found.  Anti-infectives: Anti-infectives (From admission, onward)   Start     Dose/Rate Route Frequency Ordered Stop   07/09/18 0700  piperacillin-tazobactam (ZOSYN) IVPB 3.375 g     3.375 g 12.5 mL/hr over 240 Minutes  Intravenous Every 8 hours 07/09/18 0645     07/08/18 1715  piperacillin-tazobactam (ZOSYN) IVPB 3.375 g     3.375 g 12.5 mL/hr over 240 Minutes Intravenous  Once 07/08/18 1711 07/08/18 2319      Assessment/Plan: Problem List: Patient Active Problem List   Diagnosis Date Noted  . Appendiceal abscess 07/08/2018  . PAD (peripheral artery disease) ([place]) 03/15/2018  . Atherosclerosis of native artery of left lower extremity with intermittent claudication ([place]) 03/15/2018  . Chronic left-sided low back pain with left-sided sciatica 12/25/2017  . Facial droop   . Hyperlipidemia   . CVA (cerebral vascular accident) ([place]) 10/23/2017  . Critical lower limb ischemia 11/08/2016  . Smoker 11/08/2016  . Postinflammatory pulmonary fibrosis ([place]) 02/14/2016  . Cigarette smoker 12/24/2015  . Hypothyroid ? 01/16/2014  . Mild cognitive impairment 01/16/2014  . Meningioma ([place]) 10/21/2013  . Chest pain 10/20/2013  . Medicare annual wellness visit, subsequent 06/16/2013  . Dizziness and giddiness 06/16/2013  . RLS (restless legs syndrome) 04/29/2012  . Abdominal pain 12/27/2010  . DEGENERATIVE JOINT DISEASE 09/23/2010  . CAROTID ARTERY DISEASE 08/15/2010  . CHEST PAIN 08/15/2010  . HEADACHE 12/02/2009  . BACK PAIN 11/22/2009  . CONSTIPATION, CHRONIC 01/29/2008  . NAUSEA 01/28/2008  . DEPRESSION 03/08/2007  . Essential hypertension 03/08/2007  CT repeat today is pending.  Discussed outpatient colonoscopy at some point if able to manage wihtout surgery.   * No surgery found *    LOS: 5 days   Matt B. Hassell Done, MD, [facility] [phone] beeper [phone]  07/13/2018 8:46 AM

## 2018-07-13 NOTE — Progress Notes (Signed)
PROGRESS NOTE  [name]  BJS:[id number] DOB: Feb 05, 1946 DOA: 07/08/2018 PCP: [name], MD   Brief Narrative: [name] is a 72 y.o. female with a history of IBS-C, HTN, hypothyroidism, hx CVA/TIA, and bipolar disorder who rpesented 10/28 with 2 weeks of progressively worsening abdominal cramping/pain becoming increasingly localized to the RLQ found to have ruptured appendicitis with small abscess. Surgery is consulted and recommending conservative therapy for now. Overall patient appears to be stabilizing. Repeat CT 11/2 showed interval enlargement of RLQ abscess.   Assessment & Plan: Principal Problem:   Appendiceal abscess Active Problems:   RLS (restless legs syndrome)   Postinflammatory pulmonary fibrosis (H[facility])   Smoker   Hyperlipidemia   PAD (peripheral artery disease) (H[facility])  Acute perforated appendicitis with abscess: Not amenable to drainage per IR on initial CT. - Need IR review for possible drain now, or plan per surgery.   - Continue diet per surgery - Continue zosyn, leukocytosis resolved, afebrile, pain generally has been improving, though reported some increased pain at time of Ct today.   IBS-C:  - Restarted home miralax, constipation resolved. - Minimize narcotics as able.  Epigastric/chest pain: Not consistent with ACS, resolved.  - Continue PPI  HTN:  - Continue norvasc, restart ARB  Hypokalemia: Replace again today and recheck in AM  Bipolar disorder: Stable. - Continue cymbalta, clonazepam, lamictal  History of CVA, TIA and PAD:  - Continue ASA, statin.  Tobacco use:  - Nicotine patch  DVT prophylaxis: SCDs Code Status: Full Family Communication: None at bedside Disposition Plan: DC when cleared by general surgery.  Consultants:   General surgery  IR  Procedures:   None  Antimicrobials:  Zosyn 10/28 >>   Subjective: Abdominal pain stable, slightly improved from yesterday, though comes and goes and increased during  CT scan. Tolerating po.   Objective: Vitals:   07/12/18 2150 07/13/18 0442 07/13/18 1118 07/13/18 1342  BP: (!) 148/70 138/72  (!) 165/71  Pulse: 84 88 84 71  Resp: 18 18 16 18   Temp: 98.4 F (36.9 C) 98.8 F (37.1 C) 98.6 F (37 C) (!) 97.5 F (36.4 C)  TempSrc: Oral Oral Oral Oral  SpO2: 98% 99% 98% 100%  Weight:      Height:        Intake/Output Summary (Last 24 hours) at 07/13/2018 1839 Last data filed at 07/13/2018 1430 Gross per 24 hour  Intake 2100 ml  Output -  Net 2100 ml   Filed Weights   07/08/18 2124  Weight: 70.3 kg   Gen: 72 y.o. female in no distress Pulm: Nonlabored breathing room air. Clear. CV: Regular rate and rhythm. No murmur, rub, or gallop. No JVD, no dependent edema. GI: Abdomen soft, mild tenderness in RLQ, non-distended, with normoactive bowel sounds.  Ext: Warm, no deformities Skin: No rashes, lesions or ulcers on visualized skin. Marland Kitchen  Neuro: Alert and oriented. No focal neurological deficits. Psych: Judgement and insight appear fair. Mood euthymic & affect congruent. Behavior is appropriate.    Data Reviewed: I have personally reviewed following labs and imaging studies  CBC: Recent Labs  Lab 07/08/18 1344 07/09/18 0757 07/11/18 0239  WBC 11.5* 13.3* 9.2  HGB 12.6 12.0 10.9*  HCT 40.8 39.0 35.3*  MCV 92.7 93.1 91.5  PLT 252 213 607   Basic Metabolic Panel: Recent Labs  Lab 07/08/18 1344 07/09/18 0757 07/12/18 0146  NA 132* 133* 135  K 4.0 3.6 3.0*  CL 96* 98 97*  CO2  29 27 28   GLUCOSE 128* 143* 108*  BUN 8 6* 5*  CREATININE 0.81 1.02* 0.85  CALCIUM 8.8* 8.4* 8.6*   GFR: Estimated Creatinine Clearance: 56.5 mL/min (by C-G formula based on SCr of 0.85 mg/dL). Liver Function Tests: Recent Labs  Lab 07/08/18 1344 07/09/18 0757  AST 15 12*  ALT 11 9  ALKPHOS 66 65  BILITOT 0.8 0.6  PROT 7.0 6.1*  ALBUMIN 3.6 3.0*   Recent Labs  Lab 07/08/18 1344  LIPASE 22   No results for input(s): AMMONIA in the last 168  hours. Coagulation Profile: Recent Labs  Lab 07/09/18 0757  INR 1.15   Cardiac Enzymes: Recent Labs  Lab 07/09/18 2024  TROPONINI <0.03   BNP (last 3 results) No results for input(s): PROBNP in the last 8760 hours. HbA1C: No results for input(s): HGBA1C in the last 72 hours. CBG: No results for input(s): GLUCAP in the last 168 hours. Lipid Profile: No results for input(s): CHOL, HDL, LDLCALC, TRIG, CHOLHDL, LDLDIRECT in the last 72 hours. Thyroid Function Tests: No results for input(s): TSH, T4TOTAL, FREET4, T3FREE, THYROIDAB in the last 72 hours. Anemia Panel: No results for input(s): VITAMINB12, FOLATE, FERRITIN, TIBC, IRON, RETICCTPCT in the last 72 hours. Urine analysis:    Component Value Date/Time   COLORURINE YELLOW 07/08/2018 [place] 07/08/2018 2231   LABSPEC 1.025 07/08/2018 2231   PHURINE 7.0 07/08/2018 2231   GLUCOSEU NEGATIVE 07/08/2018 2231   HGBUR SMALL (A) 07/08/2018 2231   BILIRUBINUR NEGATIVE 07/08/2018 2231   BILIRUBINUR neg 07/31/2013 1752   KETONESUR NEGATIVE 07/08/2018 2231   PROTEINUR NEGATIVE 07/08/2018 2231   UROBILINOGEN 0.2 07/31/2013 1752   NITRITE NEGATIVE 07/08/2018 2231   LEUKOCYTESUR NEGATIVE 07/08/2018 2231   Recent Results (from the past 240 hour(s))  Urine culture     Status: None   Collection Time: 07/08/18 10:22 PM  Result Value Ref Range Status   Specimen Description URINE, RANDOM  Final   Special Requests NONE  Final   Culture   Final    NO GROWTH Performed at [facility], [place] [address] [place], [place] [address]    Report Status 07/10/2018 FINAL  Final      Radiology Studies: Ct Abdomen Pelvis W Contrast  Result Date: 07/13/2018 CLINICAL DATA:  Worsening abdominal pain, fever and chills. Appendiceal abscess. EXAM: CT ABDOMEN AND PELVIS WITH CONTRAST TECHNIQUE: Multidetector CT imaging of the abdomen and pelvis was performed using the standard protocol following bolus administration of  intravenous contrast. CONTRAST:  144mL OMNIPAQUE IOHEXOL 300 MG/ML  SOLN COMPARISON:  07/08/2018 FINDINGS: Lower chest: Calcific atherosclerotic disease of the aorta and coronary arteries. Mildly enlarged heart. Hepatobiliary: No focal liver abnormality is seen. No gallstones, gallbladder wall thickening, or biliary dilatation. Pancreas: Unremarkable. No pancreatic ductal dilatation or surrounding inflammatory changes. Spleen: Normal in size without focal abnormality. Adrenals/Urinary Tract: Adrenal glands are unremarkable. Kidneys are without renal calculi, focal lesion, or hydronephrosis. Benign-appearing left renal cyst. Other bilateral subcentimeter probable renal cysts, too small to be actually characterize by CT. Tenting of the right anterior surface of the urinary bladder, and mucosal thickening, adjacent to the appendiceal abscess is noted. Stomach/Bowel: The stomach is normal. No evidence of small-bowel obstruction. Normal colonic bowel pattern. The previously demonstrated appendiceal thick-walled gas and fluid collection in the right lower quadrant of the abdomen has enlarged and measures 5.0 x 4.8 x 4.3 Cm. Vascular/Lymphatic: No adenopathy. Infrarenal abdominal aortic aneurysm measures 3.1 cm. Calcific atherosclerotic disease  of the aorta and tortuosity. Reproductive: Status post hysterectomy. No adnexal masses. Other: No free fluid or free gas within the abdomen outside of the loculated right lower quadrant abscess. Musculoskeletal: Spondylosis of the lumbosacral spine. IMPRESSION: Interval enlargement of the known right appendiceal abscess, which now measures 5 cm in greatest dimension. Likely reactive tenting and mucosal thickening of the adjacent urinary bladder. No free fluid or free gas outside of the abscess collection. Infrarenal abdominal aortic aneurysm measures 3.1 cm. Recommend followup by ultrasound in 3 years. This recommendation follows ACR consensus guidelines: White Paper of the ACR  Incidental Findings Committee II on Vascular Findings. Natasha [employer] Coll Radiol 2013; 10:789-794 Electronically Signed   By: Fidela Salisbury M.D.   On: 07/13/2018 15:59    Scheduled Meds: . acetaminophen  650 mg Oral Q6H  . amLODipine  10 mg Oral Daily  . aspirin  81 mg Oral Daily  . atorvastatin  40 mg Oral q1800  . clonazePAM  0.5 mg Oral QHS  . DULoxetine  30 mg Oral QHS  . DULoxetine  60 mg Oral BH-q7a  . lamoTRIgine  100 mg Oral Daily  . losartan  50 mg Oral Daily  . nicotine  21 mg Transdermal Daily  . pantoprazole  40 mg Oral Daily  . polyethylene glycol  17 g Oral Daily  . potassium chloride  40 mEq Oral BID  . rOPINIRole  2 mg Oral QHS   Continuous Infusions: . dextrose 5 % and 0.45% NaCl 125 mL/hr at 07/13/18 1037  . piperacillin-tazobactam (ZOSYN)  IV 3.375 g (07/13/18 1631)     LOS: 5 days   Time spent: 25 minutes.  Patrecia Pour, MD [facility] [email or web address] Password [facility] 07/13/2018, 6:39 PM

## 2018-07-13 NOTE — Plan of Care (Signed)
  Problem: Education: Goal: Knowledge of General Education information will improve Description Including pain rating scale, medication(s)/side effects and non-pharmacologic comfort measures Outcome: Progressing Note:  POC reviewed with pt.;pt. forgetful at times.

## 2018-07-13 NOTE — Progress Notes (Signed)
Physical Therapy Treatment Patient Details Name: [name] MRN: [id number] DOB: [birth year] Today's Date: 07/13/2018    History of Present Illness Patient is a 72 y/o female who presents with RLQ pain, found to have Acute perforated appendicitis with abscess. PMh includes stroke, HTN, CAD, bipolar disorder.     PT Comments    Patient seen for mobility progression.  Pt continues to present with unsteady gait and with LOB X3 requiring min/mod A for balance. Pt educated on need for bilat UE support for decreased risk of falls however pt refuses to use RW during session. Pt has decreased awareness of deficits and easily distracted. Pt reports hearing "a man and a woman doing dishes" next door to her room and foot steps above her room. No family present at time of session. Continue to recommend HHPT for further skilled PT services to maximize independence and safety with mobility.   Follow Up Recommendations  Supervision for mobility/OOB;Home health PT     Equipment Recommendations  Rolling walker with 5" wheels    Recommendations for Other Services       Precautions / Restrictions Precautions Precautions: Fall Restrictions Weight Bearing Restrictions: No    Mobility  Bed Mobility               General bed mobility comments: Sitting EOB upon PT arrival.   Transfers Overall transfer level: Needs assistance Equipment used: None Transfers: Sit to/from Stand Sit to Stand: Min guard         General transfer comment: min guard for safety  Ambulation/Gait Ambulation/Gait assistance: Mod assist;Min assist Gait Distance (Feet): 250 Feet Assistive device: (rail in hallway and assist at trunk with gait belt) Gait Pattern/deviations: Step-through pattern;Staggering right;Staggering left;Drifts right/left;Decreased stride length Gait velocity: decreased   General Gait Details: pt with unsteady gait and LOB X3 requiring increased assist to recover; pt reaching for something  to hold onto for balance at all times but refuses to use RW   Stairs Stairs: Yes Stairs assistance: Min assist Stair Management: One rail Right;Alternating pattern;Forwards Number of Stairs: 4 General stair comments: limited by IV line; cues for safety   Wheelchair Mobility    Modified Rankin (Stroke Patients Only)       Balance Overall balance assessment: Needs assistance Sitting-balance support: No upper extremity supported;Feet supported Sitting balance-Leahy Scale: Good     Standing balance support: During functional activity;Single extremity supported Standing balance-Leahy Scale: Poor Standing balance comment: pt requires at least single UE support and/or external assist                            Cognition Arousal/Alertness: Awake/alert Behavior During Therapy: WFL for tasks assessed/performed Overall Cognitive Status: Impaired/Different from baseline Area of Impairment: Awareness;Safety/judgement                         Safety/Judgement: Decreased awareness of safety;Decreased awareness of deficits     General Comments: A&Ox4. Pt reports that she knows she is in the hospital but feels like she is somewhere else but doesn't know where and that she hears noises in her room like "a man and woman washing dishes"; refuses [place] of RW despite LOB X3       Exercises      General Comments        Pertinent Vitals/Pain Pain Assessment: Faces Faces Pain Scale: No hurt Pain Intervention(s): Monitored during session    Home Living  Prior Function            PT Goals (current goals can now be found in the care plan section) Progress towards PT goals: Progressing toward goals    Frequency    Min 3X/week      PT Plan Current plan remains appropriate    Co-evaluation              AM-PAC PT "6 Clicks" Daily Activity  Outcome Measure  Difficulty turning over in bed (including adjusting bedclothes,  sheets and blankets)?: None Difficulty moving from lying on back to sitting on the side of the bed? : None Difficulty sitting down on and standing up from a chair with arms (e.g., wheelchair, bedside commode, etc,.)?: A Little Help needed moving to and from a bed to chair (including a wheelchair)?: A Little Help needed walking in hospital room?: A Little Help needed climbing 3-5 steps with a railing? : A Little 6 Click Score: 20    End of Session Equipment Utilized During Treatment: Gait belt Activity Tolerance: Patient tolerated treatment well Patient left: in bed;with call bell/phone within reach;with bed alarm set Nurse Communication: Mobility status PT Visit Diagnosis: Unsteadiness on feet (R26.81);Difficulty in walking, not elsewhere classified (R26.2)     Time: 1137-1207 PT Time Calculation (min) (ACUTE ONLY): 30 min  Charges:  $Gait Training: 23-37 mins                     Earney Navy, PTA Acute Rehabilitation Services Pager: [phone] Office: [phone]     Darliss Cheney 07/13/2018, 2:59 PM

## 2018-07-13 NOTE — Progress Notes (Signed)
Patient had an uneventful day. Mood and affect were normal and appropriate for her current condition.   Vital signs were within normal limits and lung sounds were clear.   Patient was in bed most of the day, ambulated once around the nurses desk.   Husband is currently at bedside.

## 2018-07-14 ENCOUNTER — Inpatient Hospital Stay (HOSPITAL_COMMUNITY): Payer: Medicare Other

## 2018-07-14 LAB — BASIC METABOLIC PANEL
Anion gap: 3 — ABNORMAL LOW (ref 5–15)
BUN: 6 mg/dL — ABNORMAL LOW (ref 8–23)
CALCIUM: 8.4 mg/dL — AB (ref 8.9–10.3)
CHLORIDE: 98 mmol/L (ref 98–111)
CO2: 32 mmol/L (ref 22–32)
CREATININE: 0.84 mg/dL (ref 0.44–1.00)
GFR calc Af Amer: >60 mL/min (ref >60)
GFR calc non Af Amer: >60 mL/min (ref >60)
GLUCOSE: 124 mg/dL — AB (ref 70–99)
Potassium: 3.2 mmol/L — ABNORMAL LOW (ref 3.5–5.1)
Sodium: 133 mmol/L — ABNORMAL LOW (ref 135–145)

## 2018-07-14 MED ORDER — MIDAZOLAM HCL 2 MG/2ML IJ SOLN
INTRAMUSCULAR | Status: AC | PRN
  Administered 2018-07-14: 1 mg via INTRAVENOUS
  Administered 2018-07-14: 0.5 mg via INTRAVENOUS
  Administered 2018-07-14: 1.5 mg via INTRAVENOUS

## 2018-07-14 MED ORDER — POTASSIUM CHLORIDE CRYS ER 20 MEQ PO TBCR
40.0000 meq | EXTENDED_RELEASE_TABLET | Freq: Two times a day (BID) | ORAL | Status: AC
  Administered 2018-07-14 (×2): 40 meq via ORAL
  Filled 2018-07-14 (×2): qty 2

## 2018-07-14 MED ORDER — FENTANYL CITRATE (PF) 100 MCG/2ML IJ SOLN
INTRAMUSCULAR | Status: AC | PRN
  Administered 2018-07-14: 50 ug via INTRAVENOUS
  Administered 2018-07-14: 25 ug via INTRAVENOUS
  Administered 2018-07-14: 75 ug via INTRAVENOUS

## 2018-07-14 MED ORDER — LIDOCAINE HCL 1 % IJ SOLN
INTRAMUSCULAR | Status: AC
  Filled 2018-07-14: qty 20

## 2018-07-14 MED ORDER — SODIUM CHLORIDE 0.9% FLUSH
5.0000 mL | Freq: Three times a day (TID) | INTRAVENOUS | Status: DC
  Administered 2018-07-14 – 2018-07-16 (×5): 5 mL

## 2018-07-14 MED ORDER — MIDAZOLAM HCL 2 MG/2ML IJ SOLN
INTRAMUSCULAR | Status: AC
  Filled 2018-07-14: qty 4

## 2018-07-14 MED ORDER — FENTANYL CITRATE (PF) 100 MCG/2ML IJ SOLN
INTRAMUSCULAR | Status: AC
  Filled 2018-07-14: qty 4

## 2018-07-14 NOTE — Procedures (Signed)
Interventional Radiology Procedure Note  Procedure: Placement of a RLQ abscess drain. 59F  ~20cc purulent material aspirated for cx  Complications: None Recommendations:  - to bulb suction - follow up culture - Do not submerge - Routine drain care   Signed,  Dulcy Fanny. Earleen Newport, DO

## 2018-07-14 NOTE — Consult Note (Signed)
Chief Complaint: Patient was seen in consultation today for CT-guided aspiration, possible drainage of right lower quadrant abdominal abscess Chief Complaint  Patient presents with  . Abdominal Pain    Referring Physician(s): Martin,M  Supervising Physician: Corrie Mckusick  Patient Status: [facility] - In-pt  History of Present Illness: [name] is a 72 y.o. female with history of bipolar disorder, coronary artery disease, pression, GERD, hypertension, hypothyroidism, prior stroke and IBS who presented to [facility] on 10/28 with persistent and worsening abdominal pain, primarily right lower quadrant along with intermittent nausea/fever/chills.  Recent imaging has revealed enlargement of known right appendiceal abscess which now measures approximately 5 cm.  Request received for image guided drainage of the abscess.  Past Medical History:  Diagnosis Date  . Bipolar disorder ([place])   . CAD (coronary artery disease)    intra and extracranial vascular dx per MRI 4/11, neurology rec strict CVRF control  . Colonic inertia   . Constipation    chronic;severe  . Depression   . Duodenitis   . EKG abnormalities    changes, stress test neg (false EKG changes)  . Gastritis   . GERD (gastroesophageal reflux disease)   . Hypertension   . Hypothyroid 01/16/2014  . Meningioma ([place]) 10/21/2013  . Psoriasis    sees derm  . Stroke [facility])     Past Surgical History:  Procedure Laterality Date  . arthroscopy  04/2010   Right knee  . TUBAL LIGATION      Allergies: Patient has no known allergies.  Medications: Prior to Admission medications   Medication Sig Start Date End Date Taking? Authorizing Provider  amLODipine (NORVASC) 10 MG tablet Take 1 tablet (10 mg total) by mouth daily. 10/25/17  Yes Rai, Ripudeep K, MD  aspirin EC 325 MG EC tablet Take 1 tablet (325 mg total) by mouth daily. 10/25/17  Yes Rai, Ripudeep K, MD  bisacodyl (DULCOLAX) 5 MG EC tablet Take 10 mg by mouth at  bedtime.    Yes [provider]  CASCARA SAGRADA PO Take 2 capsules by mouth at bedtime.   Yes [provider]  clonazePAM (KLONOPIN) 0.5 MG tablet Take 0.5 mg by mouth at bedtime.   Yes [provider]  DULoxetine (CYMBALTA) 30 MG capsule 30 mg at bedtime.   Yes [provider]  DULoxetine (CYMBALTA) 60 MG capsule Take 60 mg by mouth every morning.    Yes [provider]  famotidine (PEPCID) 20 MG tablet One at bedtime 02/14/16  Yes Tanda Rockers, MD  lamoTRIgine (LAMICTAL) 100 MG tablet Take 100 mg by mouth daily.  12/18/17  Yes [provider]  linaclotide Rolan Lipa) 145 MCG CAPS capsule Take 1 capsule (145 mcg total) by mouth daily before breakfast. 11/28/17  Yes Armbruster, Carlota Raspberry, MD  losartan (COZAAR) 50 MG tablet Take 1 tablet (50 mg total) by mouth daily. 10/25/17  Yes Rai, Ripudeep K, MD  meclizine (ANTIVERT) 12.5 MG tablet Take 1 tablet (12.5 mg total) by mouth 3 (three) times daily. 05/23/18  Yes Kathrynn Ducking, MD  polyethylene glycol [facility] / Floria Raveling) packet Take 34 g by mouth every other day.    Yes [provider]  rOPINIRole (REQUIP) 2 MG tablet TAKE 1 TABLET BY MOUTH AT BEDTIME Patient taking differently: Take 2 mg by mouth at bedtime.  05/27/12  Yes Dunn, Areta Haber, PA-C  senna (SENOKOT) 8.6 MG TABS tablet Take 2 tablets by mouth at bedtime.    Yes [provider]  mupirocin ointment (BACTROBAN) 2 % Apply 1 application topically 2 (two) times daily. Patient not taking: Reported on 07/08/2018 05/30/18   [name], MD     Family History  Problem Relation Age of Onset  . Breast cancer Mother        metastisis to bones  . Diabetes Son   . Heart disease Unknown        grandfather   . Alcohol abuse Brother   . Heart disease Brother   . Heart disease Maternal Aunt   . Lung cancer Brother        smoked  . Colon cancer Neg Hx     Social History   Socioeconomic History  . Marital status: Married      Spouse name: Not on file  . Number of children: 1  . Years of education: Not on file  . Highest education level: Not on file  Occupational History  . Occupation: retired  Scientific laboratory technician  . Financial resource strain: Not on file  . Food insecurity:    Worry: Not on file    Inability: Not on file  . Transportation needs:    Medical: Not on file    Non-medical: Not on file  Tobacco Use  . Smoking status: Current Every Day Smoker    Packs/day: 1.00    Years: 60.00    Pack years: 60.00    Types: Cigarettes  . Smokeless tobacco: Never Used  Substance and Sexual Activity  . Alcohol use: Yes    Alcohol/week: 0.0 standard drinks    Comment: yes on occassion  . Drug use: No  . Sexual activity: Not on file  Lifestyle  . Physical activity:    Days per week: Not on file    Minutes per session: Not on file  . Stress: Not on file  Relationships  . Social connections:    Talks on phone: Not on file    Gets together: Not on file    Attends religious service: Not on file    Active member of club or organization: Not on file    Attends meetings of clubs or organizations: Not on file    Relationship status: Not on file  Other Topics Concern  . Not on file  Social History Narrative   Brother in law Mr Rexford Maus (one of my patients)   Lives w/ husband          Review of Systems see above: Denies chest pain, dyspnea, back pain, vomiting or bleeding.  She does have occasional cough and headache  Vital Signs: BP (!) 155/67 (BP Location: Left Arm)   Pulse 70   Temp 98.8 F (37.1 C) (Oral)   Resp 18   Ht 5\' 7"  (1.702 m)   Wt 155 lb (70.3 kg)   SpO2 (!) 79%   BMI 24.28 kg/m   Physical Exam awake, alert.  Chest clear to auscultation bilaterally.  Heart with regular rate and rhythm, soft murmur.  Abdomen soft, positive bowel sounds, mild- mod tender right lower quadrant.  No lower extremity edema.  Imaging: Dg Chest 2 View  Result Date: 07/08/2018 CLINICAL DATA:  Cough  for a year. History of chronic interstitial lung disease bronchitis. EXAM: CHEST - 2 VIEW COMPARISON:  Chest radiograph October 24, 2017 and CT chest Jan 20, 2016. FINDINGS: Similar chronic interstitial changes confluent in lung bases. No pleural effusion or focal consolidation. Cardiomediastinal silhouette is normal. Calcified aortic arch. No pneumothorax. Soft tissue  planes and included osseous structures are non suspicious. Moderate degenerative change of the thoracic spine. IMPRESSION: Chronic interstitial changes without focal consolidation. Aortic Atherosclerosis (ICD10-I70.0). Electronically Signed   By: Elon Alas M.D.   On: 07/08/2018 15:03   Ct Abdomen Pelvis W Contrast  Result Date: 07/13/2018 CLINICAL DATA:  Worsening abdominal pain, fever and chills. Appendiceal abscess. EXAM: CT ABDOMEN AND PELVIS WITH CONTRAST TECHNIQUE: Multidetector CT imaging of the abdomen and pelvis was performed using the standard protocol following bolus administration of intravenous contrast. CONTRAST:  139mL OMNIPAQUE IOHEXOL 300 MG/ML  SOLN COMPARISON:  07/08/2018 FINDINGS: Lower chest: Calcific atherosclerotic disease of the aorta and coronary arteries. Mildly enlarged heart. Hepatobiliary: No focal liver abnormality is seen. No gallstones, gallbladder wall thickening, or biliary dilatation. Pancreas: Unremarkable. No pancreatic ductal dilatation or surrounding inflammatory changes. Spleen: Normal in size without focal abnormality. Adrenals/Urinary Tract: Adrenal glands are unremarkable. Kidneys are without renal calculi, focal lesion, or hydronephrosis. Benign-appearing left renal cyst. Other bilateral subcentimeter probable renal cysts, too small to be actually characterize by CT. Tenting of the right anterior surface of the urinary bladder, and mucosal thickening, adjacent to the appendiceal abscess is noted. Stomach/Bowel: The stomach is normal. No evidence of small-bowel obstruction. Normal colonic bowel  pattern. The previously demonstrated appendiceal thick-walled gas and fluid collection in the right lower quadrant of the abdomen has enlarged and measures 5.0 x 4.8 x 4.3 Cm. Vascular/Lymphatic: No adenopathy. Infrarenal abdominal aortic aneurysm measures 3.1 cm. Calcific atherosclerotic disease of the aorta and tortuosity. Reproductive: Status post hysterectomy. No adnexal masses. Other: No free fluid or free gas within the abdomen outside of the loculated right lower quadrant abscess. Musculoskeletal: Spondylosis of the lumbosacral spine. IMPRESSION: Interval enlargement of the known right appendiceal abscess, which now measures 5 cm in greatest dimension. Likely reactive tenting and mucosal thickening of the adjacent urinary bladder. No free fluid or free gas outside of the abscess collection. Infrarenal abdominal aortic aneurysm measures 3.1 cm. Recommend followup by ultrasound in 3 years. This recommendation follows ACR consensus guidelines: White Paper of the ACR Incidental Findings Committee II on Vascular Findings. Natasha Mead Coll Radiol 2013; 10:789-794 Electronically Signed   By: Fidela Salisbury M.D.   On: 07/13/2018 15:59   Ct Abdomen Pelvis W Contrast  Result Date: 07/08/2018 CLINICAL DATA:  Progressive right lower quadrant abdominal pain with fever and chills. EXAM: CT ABDOMEN AND PELVIS WITH CONTRAST TECHNIQUE: Multidetector CT imaging of the abdomen and pelvis was performed using the standard protocol following bolus administration of intravenous contrast. CONTRAST:  17mL OMNIPAQUE IOHEXOL 300 MG/ML SOLN, 66mL OMNIPAQUE IOHEXOL 300 MG/ML SOLN COMPARISON:  Multiple exams, including 11/05/2009 FINDINGS: Lower chest: Mild peripheral interstitial accentuation in the lung bases favoring the lower lobes, new compared to 11/05/2009 and mildly progressive compared to 01/20/2016, with subpleural reticulation suggesting UIP. Descending thoracic aortic atherosclerotic calcification. Right coronary artery  atherosclerotic calcification. Hepatobiliary: Unremarkable Pancreas: Unremarkable Spleen: Unremarkable Adrenals/Urinary Tract: Left kidney lower pole benign cyst. Hypodense lesions in the right kidney are technically too small to characterize although statistically likely to be cysts. Stomach/Bowel: A tubular structure below the cecum separate from the terminal ileum and likely representing inflamed appendix measures up to 1.5 cm in diameter. There is an adjacent collection of gas and fluid measuring 3.4 by 1.9 by 2.6 cm compatible with periappendiceal abscess. The extraluminal gas appears contained and I do not see any other extraluminal gas or truly free air. Vascular/Lymphatic: Infrarenal abdominal aortic aneurysm 3.1 cm transverse on image  83/6. Aortoiliac atherosclerotic vascular disease. Reproductive: Unremarkable Other: No supplemental non-categorized findings. Musculoskeletal: Lumbar scoliosis, spondylosis, and degenerative disc disease. IMPRESSION: 1. Acute appendicitis with periappendiceal stranding and a periappendiceal abscess containing gas and fluid measuring up to 3.4 cm in diameter, favoring early rupture. Aside from this abscess there is no other extraluminal gas. 2. 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 3. Infrarenal abdominal aortic aneurysm 3.1 cm in diameter. Recommend followup by [place] in 3 years. This recommendation follows ACR consensus guidelines: White Paper of the ACR Incidental Findings Committee II on Vascular Findings. J Am Coll Radiol 2013; 38:182-993 4. Other imaging findings of potential clinical significance: Aortic Atherosclerosis (ICD10-I70.0). Lumbar scoliosis, spondylosis, and degenerative disc disease. Right coronary artery atherosclerosis. Electronically Signed   By: Van Clines M.D.   On: 07/08/2018 16:54    Labs:  CBC: Recent Labs    03/05/18 1758 04/30/18 1628 07/08/18 1344 07/09/18 0757 07/11/18 0239  WBC 7.4 7.9 11.5* 13.3*  9.2  HGB 13.6 13.1 12.6 12.0 10.9*  HCT 41.9 40.8 40.8 39.0 35.3*  PLT 241  --  252 213 208    COAGS: Recent Labs    10/23/17 1630 07/09/18 0757  INR 1.01 1.15  APTT 34  --     BMP: Recent Labs    07/08/18 1344 07/09/18 0757 07/12/18 0146 07/14/18 0530  NA 132* 133* 135 133*  K 4.0 3.6 3.0* 3.2*  CL 96* 98 97* 98  CO2 29 27 28  32  GLUCOSE 128* 143* 108* 124*  BUN 8 6* 5* 6*  CALCIUM 8.8* 8.4* 8.6* 8.4*  CREATININE 0.81 1.02* 0.85 0.84  GFRNONAA >60 53* >60 >60  GFRAA >60 >60 >60 >60    LIVER FUNCTION TESTS: Recent Labs    10/23/17 1630 03/05/18 1758 07/08/18 1344 07/09/18 0757  BILITOT 0.4 0.3 0.8 0.6  AST 21 16 15  12*  ALT 19 14 11 9   ALKPHOS 65 70 66 65  PROT 6.5 7.4 7.0 6.1*  ALBUMIN 3.5 4.4 3.6 3.0*    TUMOR MARKERS: No results for input(s): AFPTM, CEA, CA199, CHROMGRNA in the last 8760 hours.  Assessment and Plan: 72 y.o. female with history of bipolar disorder, coronary artery disease, pression, GERD, hypertension, hypothyroidism, prior stroke and IBS who presented to [facility] on 10/28 with persistent and worsening abdominal pain, primarily right lower quadrant along with intermittent nausea/fever/chills.  Recent imaging has revealed enlargement of known right appendiceal abscess which now measures approximately 5 cm.  Request received for image guided drainage of the abscess. Imaging studies have been reviewed by Dr. Earleen Newport.  Risks and benefits discussed with the patient/brother-in-law including bleeding, infection, inability to drain abscess, damage to adjacent structures, bowel perforation/fistula connection, and sepsis  All of the patient's questions were answered, patient is agreeable to proceed. Consent signed and in chart.  Procedure scheduled for later today.   Thank you for this interesting consult.  I greatly enjoyed meeting [name] and look forward to participating in their care.  A copy of this report was sent to the  requesting provider on this date.  Electronically Signed: D. Rowe Robert, PA-C 07/14/2018, 9:38 AM   I spent a total of 30 minutes    in face to face in clinical consultation, greater than 50% of which was counseling/coordinating care for CT-guided aspiration/possible drainage of right lower abdominal abscess

## 2018-07-14 NOTE — Progress Notes (Signed)
PROGRESS NOTE  [name]  ZOX:[id number] DOB: April 02, 1946 DOA: 07/08/2018 PCP: [name], MD   Brief Narrative: [name] is a 72 y.o. female with a history of IBS-C, HTN, hypothyroidism, hx CVA/TIA, and bipolar disorder who rpesented 10/28 with 2 weeks of progressively worsening abdominal cramping/pain becoming increasingly localized to the RLQ found to have ruptured appendicitis with small abscess. Surgery was consulted, recommended conservative therapy with IV antibiotics. Repeat CT 11/2 showed interval enlargement of RLQ abscess and IR reconsulted. Where there was initially no window for drain placement, this became possible and drain was placed 11/3.  Assessment & Plan: Principal Problem:   Appendiceal abscess Active Problems:   RLS (restless legs syndrome)   Postinflammatory pulmonary fibrosis (HCC)   Smoker   Hyperlipidemia   PAD (peripheral artery disease) (HCC)  Acute perforated appendicitis with abscess: s/p perc drain 11/3 by IR. - Routine drain care - Continue IV abx empirically. Tailor as abscess culture results are available, note leukocytosis resolved, afebrile.  IBS-C:  - Restarted home miralax, constipation resolved. - Minimize narcotics as able.  Epigastric/chest pain: Not consistent with ACS, resolved. - Continue PPI  HTN:  - Continue norvasc, restarted ARB  Hypokalemia: Improving but will required to replace again today and recheck in AM  Bipolar disorder: Stable. - Continue cymbalta, clonazepam, lamictal  History of CVA, TIA and PAD:  - Continue ASA, statin.  Tobacco use:  - Nicotine patch  DVT prophylaxis: SCDs Code Status: Full Family Communication: None at bedside Disposition Plan: DC when cleared by general surgery.  Consultants:   General surgery  IR  Procedures:   RLQ drain placement by IR, Dr. Earleen Newport 07/14/2018  Antimicrobials:  Zosyn 10/28 >>   Subjective: Feels abdominal pain is stable, moderate, in RLQ  nonradiating, sometimes worse with motion, better with medications we're giving. Tolerating po, having regular BMs.  Objective: Vitals:   07/14/18 1235 07/14/18 1240 07/14/18 1305 07/14/18 1324  BP: (!) 153/70 (!) 141/66 136/67 123/70  Pulse: 73 73 70 75  Resp: 12 12 12 14   Temp:    98.4 F (36.9 C)  TempSrc:    Oral  SpO2: 97% 97% 95% 95%  Weight:      Height:        Intake/Output Summary (Last 24 hours) at 07/14/2018 1703 Last data filed at 07/14/2018 1430 Gross per 24 hour  Intake 2731.01 ml  Output 10 ml  Net 2721.01 ml   Filed Weights   07/08/18 2124  Weight: 70.3 kg   Gen: 72 y.o. female in no distress Pulm: Nonlabored breathing room air. Clear. CV: Regular rate and rhythm. No murmur, rub, or gallop. No JVD, no dependent edema. GI: Abdomen soft, tender focally in RLQ, no rebound +guarding, +BS.  Ext: Warm, no deformities Skin: No rashes, lesions or ulcers on visualized skin.  Neuro: Alert and oriented. No focal neurological deficits. Psych: Judgement and insight appear fair. Mood euthymic & affect congruent. Behavior is appropriate.    Data Reviewed: I have personally reviewed following labs and imaging studies  CBC: Recent Labs  Lab 07/08/18 1344 07/09/18 0757 07/11/18 0239  WBC 11.5* 13.3* 9.2  HGB 12.6 12.0 10.9*  HCT 40.8 39.0 35.3*  MCV 92.7 93.1 91.5  PLT 252 213 811   Basic Metabolic Panel: Recent Labs  Lab 07/08/18 1344 07/09/18 0757 07/12/18 0146 07/14/18 0530  NA 132* 133* 135 133*  K 4.0 3.6 3.0* 3.2*  CL 96* 98 97* 98  CO2 29 27  28 32  GLUCOSE 128* 143* 108* 124*  BUN 8 6* 5* 6*  CREATININE 0.81 1.02* 0.85 0.84  CALCIUM 8.8* 8.4* 8.6* 8.4*   GFR: Estimated Creatinine Clearance: 57.1 mL/min (by C-G formula based on SCr of 0.84 mg/dL). Liver Function Tests: Recent Labs  Lab 07/08/18 1344 07/09/18 0757  AST 15 12*  ALT 11 9  ALKPHOS 66 65  BILITOT 0.8 0.6  PROT 7.0 6.1*  ALBUMIN 3.6 3.0*   Recent Labs  Lab 07/08/18 1344    LIPASE 22   No results for input(s): AMMONIA in the last 168 hours. Coagulation Profile: Recent Labs  Lab 07/09/18 0757  INR 1.15   Cardiac Enzymes: Recent Labs  Lab 07/09/18 2024  TROPONINI <0.03   BNP (last 3 results) No results for input(s): PROBNP in the last 8760 hours. HbA1C: No results for input(s): HGBA1C in the last 72 hours. CBG: No results for input(s): GLUCAP in the last 168 hours. Lipid Profile: No results for input(s): CHOL, HDL, LDLCALC, TRIG, CHOLHDL, LDLDIRECT in the last 72 hours. Thyroid Function Tests: No results for input(s): TSH, T4TOTAL, FREET4, T3FREE, THYROIDAB in the last 72 hours. Anemia Panel: No results for input(s): VITAMINB12, FOLATE, FERRITIN, TIBC, IRON, RETICCTPCT in the last 72 hours. Urine analysis:    Component Value Date/Time   COLORURINE YELLOW 07/08/2018 [place] 07/08/2018 2231   LABSPEC 1.025 07/08/2018 2231   PHURINE 7.0 07/08/2018 2231   GLUCOSEU NEGATIVE 07/08/2018 2231   HGBUR SMALL (A) 07/08/2018 2231   BILIRUBINUR NEGATIVE 07/08/2018 2231   BILIRUBINUR neg 07/31/2013 1752   KETONESUR NEGATIVE 07/08/2018 2231   PROTEINUR NEGATIVE 07/08/2018 2231   UROBILINOGEN 0.2 07/31/2013 1752   NITRITE NEGATIVE 07/08/2018 2231   LEUKOCYTESUR NEGATIVE 07/08/2018 2231   Recent Results (from the past 240 hour(s))  Urine culture     Status: None   Collection Time: 07/08/18 10:22 PM  Result Value Ref Range Status   Specimen Description URINE, RANDOM  Final   Special Requests NONE  Final   Culture   Final    NO GROWTH Performed at [facility], [place] [address], [place], [place] [address]    Report Status 07/10/2018 FINAL  Final  Aerobic/Anaerobic Culture (surgical/deep wound)     Status: None (Preliminary result)   Collection Time: 07/14/18  2:08 PM  Result Value Ref Range Status   Specimen Description ABSCESS ABDOMEN  Final   Special Requests NONE  Final   Gram Stain   Final    ABUNDANT WBC PRESENT,  PREDOMINANTLY MONONUCLEAR RARE GRAM POSITIVE RODS Performed at [facility], [place] [address] [place], [place] [address]    Culture PENDING  Incomplete   Report Status PENDING  Incomplete      Radiology Studies: Ct Abdomen Pelvis W Contrast  Result Date: 07/13/2018 CLINICAL DATA:  Worsening abdominal pain, fever and chills. Appendiceal abscess. EXAM: CT ABDOMEN AND PELVIS WITH CONTRAST TECHNIQUE: Multidetector CT imaging of the abdomen and pelvis was performed using the standard protocol following bolus administration of intravenous contrast. CONTRAST:  187mL OMNIPAQUE IOHEXOL 300 MG/ML  SOLN COMPARISON:  07/08/2018 FINDINGS: Lower chest: Calcific atherosclerotic disease of the aorta and coronary arteries. Mildly enlarged heart. Hepatobiliary: No focal liver abnormality is seen. No gallstones, gallbladder wall thickening, or biliary dilatation. Pancreas: Unremarkable. No pancreatic ductal dilatation or surrounding inflammatory changes. Spleen: Normal in size without focal abnormality. Adrenals/Urinary Tract: Adrenal glands are unremarkable. Kidneys are without renal calculi, focal lesion, or hydronephrosis.  Benign-appearing left renal cyst. Other bilateral subcentimeter probable renal cysts, too small to be actually characterize by CT. Tenting of the right anterior surface of the urinary bladder, and mucosal thickening, adjacent to the appendiceal abscess is noted. Stomach/Bowel: The stomach is normal. No evidence of small-bowel obstruction. Normal colonic bowel pattern. The previously demonstrated appendiceal thick-walled gas and fluid collection in the right lower quadrant of the abdomen has enlarged and measures 5.0 x 4.8 x 4.3 Cm. Vascular/Lymphatic: No adenopathy. Infrarenal abdominal aortic aneurysm measures 3.1 cm. Calcific atherosclerotic disease of the aorta and tortuosity. Reproductive: Status post hysterectomy. No adnexal masses. Other: No free fluid or free gas within the abdomen outside  of the loculated right lower quadrant abscess. Musculoskeletal: Spondylosis of the lumbosacral spine. IMPRESSION: Interval enlargement of the known right appendiceal abscess, which now measures 5 cm in greatest dimension. Likely reactive tenting and mucosal thickening of the adjacent urinary bladder. No free fluid or free gas outside of the abscess collection. Infrarenal abdominal aortic aneurysm measures 3.1 cm. Recommend followup by ultrasound in 3 years. This recommendation follows ACR consensus guidelines: White Paper of the ACR Incidental Findings Committee II on Vascular Findings. [facility] Coll Radiol 2013; 10:789-794 Electronically Signed   By: Fidela Salisbury M.D.   On: 07/13/2018 15:59   Ct Image Guided Drainage By Percutaneous Catheter  Result Date: 07/14/2018 INDICATION: 72 year old female with a history ruptured appendicitis and abscess EXAM: CT-GUIDED DRAINAGE OF LEFT LOWER QUADRANT ABSCESS MEDICATIONS: The patient is currently admitted to the hospital and receiving intravenous antibiotics. The antibiotics were administered within an appropriate time frame prior to the initiation of the procedure. ANESTHESIA/SEDATION: 1.5 mg IV Versed 150 mcg IV Fentanyl Moderate Sedation Time:  20 minutes The patient was continuously monitored during the procedure by the interventional radiology nurse under my direct supervision. COMPLICATIONS: None TECHNIQUE: Informed written consent was obtained from the patient after a thorough discussion of the procedural risks, benefits and alternatives. All questions were addressed. Maximal Sterile Barrier Technique was utilized including caps, mask, sterile gowns, sterile gloves, sterile drape, hand hygiene and skin antiseptic. A timeout was performed prior to the initiation of the procedure. PROCEDURE: The operative field was prepped with chlorhexidine in a sterile fashion, and a sterile drape was applied covering the operative field. A sterile gown and sterile gloves were  used for the procedure. Local anesthesia was provided with 1% Lidocaine. Once the patient is prepped and draped, 1% lidocaine was used for local anesthesia. CT guidance was used to place a trocar needle into the abscess of the right lower quadrant. Modified Seldinger technique was used to place a 10 [place] drain. Approximately 15 cc of purulent material aspirated. Sample sent to lab.  Final image was stored. Patient tolerated the procedure well and remained hemodynamically stable throughout. No complications encountered and no significant blood loss. FINDINGS: Scout CT demonstrates fluid and gas collection of the right lower quadrant. Status post drainage there is decreased fluid and gas with drainage catheter formed in the abscess region. Atherosclerosis. Diverticular disease. IMPRESSION: Status post CT-guided drainage of right lower quadrant abscess. Signed, Dulcy Fanny. Dellia Nims, RPVI [name] Electronically Signed   By: Corrie Mckusick D.O.   On: 07/14/2018 13:34    Scheduled Meds: . acetaminophen  650 mg Oral Q6H  . amLODipine  10 mg Oral Daily  . aspirin  81 mg Oral Daily  . atorvastatin  40 mg Oral q1800  . clonazePAM  0.5 mg Oral QHS  .  DULoxetine  30 mg Oral QHS  . DULoxetine  60 mg Oral BH-q7a  . fentaNYL      . lamoTRIgine  100 mg Oral Daily  . lidocaine      . losartan  50 mg Oral Daily  . midazolam      . nicotine  21 mg Transdermal Daily  . pantoprazole  40 mg Oral Daily  . polyethylene glycol  17 g Oral Daily  . potassium chloride  40 mEq Oral BID  . rOPINIRole  2 mg Oral QHS  . sodium chloride flush  5 mL Intracatheter Q8H   Continuous Infusions: . dextrose 5 % and 0.45% NaCl 125 mL/hr at 07/14/18 0521  . piperacillin-tazobactam (ZOSYN)  IV 3.375 g (07/14/18 1339)     LOS: 6 days   Time spent: 25 minutes.  Patrecia Pour, MD [facility] [email or web address] Password [email or web address] 07/14/2018, 5:03 PM

## 2018-07-14 NOTE — Progress Notes (Signed)
Patient ID: [name], female   DOB: [birth year], 72 y.o.   MRN: [id number] [facility] Progress Note:   * No surgery found *  Subjective: Mental status is alert and pleasant.  We discussed the results of her CT scan Objective: Vital signs in last 24 hours: Temp:  [97.5 F (36.4 C)-98.8 F (37.1 C)] 98.8 F (37.1 C) (11/03 0542) Pulse Rate:  [70-84] 70 (11/03 0542) Resp:  [14-18] 18 (11/03 0542) BP: (155-165)/(67-71) 155/67 (11/03 0542) SpO2:  [79 %-100 %] 79 % (11/03 0542)  Intake/Output from previous day: 11/02 0701 - 11/03 0700 In: 4531 [P.O.:600; I.V.:3679.8; IV Piggyback:251.2] Out: -  Intake/Output this shift: No intake/output data recorded.  Physical Exam: Work of breathing is normal.  Pain less and feeling better  Lab Results:  Results for orders placed or performed during the hospital encounter of 07/08/18 (from the past 48 hour(s))  Basic metabolic panel     Status: Abnormal   Collection Time: 07/14/18  5:30 AM  Result Value Ref Range   Sodium 133 (L) 135 - 145 mmol/L   Potassium 3.2 (L) 3.5 - 5.1 mmol/L   Chloride 98 98 - 111 mmol/L   CO2 32 22 - 32 mmol/L   Glucose, Bld 124 (H) 70 - 99 mg/dL   BUN 6 (L) 8 - 23 mg/dL   Creatinine, Ser 0.84 0.44 - 1.00 mg/dL   Calcium 8.4 (L) 8.9 - 10.3 mg/dL   GFR calc non Af Amer >60 >60 mL/min   GFR calc Af Amer >60 >60 mL/min    Comment: (NOTE) The eGFR has been calculated using the CKD EPI equation. This calculation has not been validated in all clinical situations. eGFR's persistently <60 mL/min signify possible Chronic Kidney Disease.    Anion gap 3 (L) 5 - 15    Comment: Performed at [facility], [place] [address], [place], [place] [address]    Radiology/Results: Ct Abdomen Pelvis W Contrast  Result Date: 07/13/2018 CLINICAL DATA:  Worsening abdominal pain, fever and chills. Appendiceal abscess. EXAM: CT ABDOMEN AND PELVIS WITH CONTRAST TECHNIQUE: Multidetector CT imaging of the abdomen and  pelvis was performed using the standard protocol following bolus administration of intravenous contrast. CONTRAST:  159m OMNIPAQUE IOHEXOL 300 MG/ML  SOLN COMPARISON:  07/08/2018 FINDINGS: Lower chest: Calcific atherosclerotic disease of the aorta and coronary arteries. Mildly enlarged heart. Hepatobiliary: No focal liver abnormality is seen. No gallstones, gallbladder wall thickening, or biliary dilatation. Pancreas: Unremarkable. No pancreatic ductal dilatation or surrounding inflammatory changes. Spleen: Normal in size without focal abnormality. Adrenals/Urinary Tract: Adrenal glands are unremarkable. Kidneys are without renal calculi, focal lesion, or hydronephrosis. Benign-appearing left renal cyst. Other bilateral subcentimeter probable renal cysts, too small to be actually characterize by CT. Tenting of the right anterior surface of the urinary bladder, and mucosal thickening, adjacent to the appendiceal abscess is noted. Stomach/Bowel: The stomach is normal. No evidence of small-bowel obstruction. Normal colonic bowel pattern. The previously demonstrated appendiceal thick-walled gas and fluid collection in the right lower quadrant of the abdomen has enlarged and measures 5.0 x 4.8 x 4.3 Cm. Vascular/Lymphatic: No adenopathy. Infrarenal abdominal aortic aneurysm measures 3.1 cm. Calcific atherosclerotic disease of the aorta and tortuosity. Reproductive: Status post hysterectomy. No adnexal masses. Other: No free fluid or free gas within the abdomen outside of the loculated right lower quadrant abscess. Musculoskeletal: Spondylosis of the lumbosacral spine. IMPRESSION: Interval enlargement of the known right appendiceal abscess, which now measures 5 cm in greatest  dimension. Likely reactive tenting and mucosal thickening of the adjacent urinary bladder. No free fluid or free gas outside of the abscess collection. Infrarenal abdominal aortic aneurysm measures 3.1 cm. Recommend followup by ultrasound in 3  years. This recommendation follows ACR consensus guidelines: White Paper of the ACR Incidental Findings Committee II on Vascular Findings. [facility] Coll Radiol 2013; 10:789-794 Electronically Signed   By: Fidela Salisbury M.D.   On: 07/13/2018 15:59    Anti-infectives: Anti-infectives (From admission, onward)   Start     Dose/Rate Route Frequency Ordered Stop   07/09/18 0700  piperacillin-tazobactam (ZOSYN) IVPB 3.375 g     3.375 g 12.5 mL/hr over 240 Minutes Intravenous Every 8 hours 07/09/18 0645     07/08/18 1715  piperacillin-tazobactam (ZOSYN) IVPB 3.375 g     3.375 g 12.5 mL/hr over 240 Minutes Intravenous  Once 07/08/18 1711 07/08/18 2319      Assessment/Plan: Problem List: Patient Active Problem List   Diagnosis Date Noted  . Appendiceal abscess 07/08/2018  . PAD (peripheral artery disease) ([place]) 03/15/2018  . Atherosclerosis of native artery of left lower extremity with intermittent claudication ([place]) 03/15/2018  . Chronic left-sided low back pain with left-sided sciatica 12/25/2017  . Facial droop   . Hyperlipidemia   . CVA (cerebral vascular accident) ([place]) 10/23/2017  . Critical lower limb ischemia 11/08/2016  . Smoker 11/08/2016  . Postinflammatory pulmonary fibrosis ([place]) 02/14/2016  . Cigarette smoker 12/24/2015  . Hypothyroid ? 01/16/2014  . Mild cognitive impairment 01/16/2014  . Meningioma ([place]) 10/21/2013  . Chest pain 10/20/2013  . Medicare annual wellness visit, subsequent 06/16/2013  . Dizziness and giddiness 06/16/2013  . RLS (restless legs syndrome) 04/29/2012  . Abdominal pain 12/27/2010  . DEGENERATIVE JOINT DISEASE 09/23/2010  . CAROTID ARTERY DISEASE 08/15/2010  . CHEST PAIN 08/15/2010  . HEADACHE 12/02/2009  . BACK PAIN 11/22/2009  . CONSTIPATION, CHRONIC 01/29/2008  . NAUSEA 01/28/2008  . DEPRESSION 03/08/2007  . Essential hypertension 03/08/2007     CT reviewed and : Interval enlargement of the known right appendiceal abscess, which now  measures 5 cm in greatest dimension.  Will ask IR if they can find a window for drainage.   * No surgery found *    LOS: 6 days   Matt B. Hassell Done, MD, [facility] [phone] beeper [phone]  07/14/2018 8:08 AM

## 2018-07-15 LAB — BASIC METABOLIC PANEL
ANION GAP: 5 (ref 5–15)
BUN: 6 mg/dL — ABNORMAL LOW (ref 8–23)
CALCIUM: 8.4 mg/dL — AB (ref 8.9–10.3)
CO2: 29 mmol/L (ref 22–32)
Chloride: 100 mmol/L (ref 98–111)
Creatinine, Ser: 0.77 mg/dL (ref 0.44–1.00)
GFR calc Af Amer: >60 mL/min (ref >60)
Glucose, Bld: 134 mg/dL — ABNORMAL HIGH (ref 70–99)
Potassium: 3.4 mmol/L — ABNORMAL LOW (ref 3.5–5.1)
SODIUM: 134 mmol/L — AB (ref 135–145)

## 2018-07-15 MED ORDER — OXYCODONE HCL 5 MG PO TABS
5.0000 mg | ORAL_TABLET | ORAL | Status: DC | PRN
  Administered 2018-07-15 – 2018-07-16 (×4): 5 mg via ORAL
  Filled 2018-07-15 (×4): qty 1

## 2018-07-15 MED ORDER — MORPHINE SULFATE (PF) 2 MG/ML IV SOLN: 1.0000 mg | INTRAVENOUS | Status: DC | PRN

## 2018-07-15 MED ORDER — ENOXAPARIN SODIUM 40 MG/0.4ML ~~LOC~~ SOLN
40.0000 mg | SUBCUTANEOUS | Status: DC
  Administered 2018-07-15: 40 mg via SUBCUTANEOUS
  Filled 2018-07-15 (×2): qty 0.4

## 2018-07-15 MED ORDER — POTASSIUM CHLORIDE CRYS ER 20 MEQ PO TBCR
40.0000 meq | EXTENDED_RELEASE_TABLET | Freq: Once | ORAL | Status: AC
  Administered 2018-07-15: 40 meq via ORAL
  Filled 2018-07-15: qty 2

## 2018-07-15 MED ORDER — TRAMADOL HCL 50 MG PO TABS
50.0000 mg | ORAL_TABLET | Freq: Four times a day (QID) | ORAL | Status: DC | PRN
  Administered 2018-07-15: 50 mg via ORAL
  Filled 2018-07-15: qty 1

## 2018-07-15 NOTE — Progress Notes (Signed)
PROGRESS NOTE  [name]  ZTI:[id number] DOB: 1946/06/15 DOA: 07/08/2018 PCP: [name], MD   Brief Narrative: [name] is a 72 y.o. female with a history of IBS-C, HTN, hypothyroidism, hx CVA/TIA, and bipolar disorder who rpesented 10/28 with 2 weeks of progressively worsening abdominal cramping/pain becoming increasingly localized to the RLQ found to have ruptured appendicitis with small abscess. Surgery was consulted, recommended conservative therapy with IV antibiotics. Repeat CT 11/2 showed interval enlargement of RLQ abscess and IR reconsulted. Where there was initially no window for drain placement, this became possible and drain was placed 11/3. Culture results remain pending and she has required IV pain medications overnight.  Assessment & Plan: Principal Problem:   Appendiceal abscess Active Problems:   RLS (restless legs syndrome)   Postinflammatory pulmonary fibrosis (HCC)   Smoker   Hyperlipidemia   PAD (peripheral artery disease) (HCC)  Acute perforated appendicitis with abscess: s/p perc drain 11/3 by IR. - Routine drain care per IR - Continue IV abx empirically. ?GPR's on culture. Clostridium, diphtheroid, listeria are possible. Tailor as abscess culture results are available. Will monitor an additional day due to severity of pain.  IBS-C:  - Restarted home miralax, constipation resolved. - Minimize narcotics as able.  Epigastric/chest pain: Not consistent with ACS, resolved. - Continue PPI  HTN:  - Continue norvasc, restarted ARB  Hypokalemia:  - Replace and recheck  Bipolar disorder: Stable. - Continue cymbalta, clonazepam, lamictal  History of CVA, TIA and PAD:  - Continue ASA, statin.  Tobacco use:  - Nicotine patch  DVT prophylaxis: SCDs Code Status: Full Family Communication: None at bedside Disposition Plan: DC when cleared by general surgery and IR. HH-RN and HH-PT ordered as well as rolling walker.  Consultants:   General  surgery  IR  Procedures:   RLQ drain placement by IR, Dr. Earleen Newport 07/14/2018  Antimicrobials:  Zosyn 10/28 >>   Subjective: RLQ pain actually worsened following procedure, used morphine a lot last night. No fever.  Objective: Vitals:   07/14/18 1324 07/14/18 1807 07/14/18 2051 07/15/18 0507  BP: 123/70 122/71 131/64 133/73  Pulse: 75 73 68 69  Resp: 14 14 16 18   Temp: 98.4 F (36.9 C) 98.1 F (36.7 C) 98.8 F (37.1 C) 97.9 F (36.6 C)  TempSrc: Oral Oral Oral Oral  SpO2: 95% 98% 95% 97%  Weight:      Height:        Intake/Output Summary (Last 24 hours) at 07/15/2018 1713 Last data filed at 07/15/2018 8250 Gross per 24 hour  Intake 3298.18 ml  Output 35 ml  Net 3263.18 ml   Filed Weights   07/08/18 2124  Weight: 70.3 kg   Gen: 72 y.o. female in no distress Pulm: Nonlabored breathing room air. Clear. CV: Regular rate and rhythm. No murmur, rub, or gallop. No JVD, no dependent edema. GI: Abdomen soft, RLQ tenderness, nondistended, +BS. Drain in place with blood tinged purulence.  Ext: Warm, no deformities Skin: No new rashes, lesions or ulcers on visualized skin.  Neuro: Alert and oriented. No focal neurological deficits. Psych: Judgement and insight appear fair. Mood euthymic & affect congruent. Behavior is appropriate.    Data Reviewed: I have personally reviewed following labs and imaging studies  CBC: Recent Labs  Lab 07/09/18 0757 07/11/18 0239  WBC 13.3* 9.2  HGB 12.0 10.9*  HCT 39.0 35.3*  MCV 93.1 91.5  PLT 213 539   Basic Metabolic Panel: Recent Labs  Lab 07/09/18 0757 07/12/18  0146 07/14/18 0530 07/15/18 0541  NA 133* 135 133* 134*  K 3.6 3.0* 3.2* 3.4*  CL 98 97* 98 100  CO2 27 28 32 29  GLUCOSE 143* 108* 124* 134*  BUN 6* 5* 6* 6*  CREATININE 1.02* 0.85 0.84 0.77  CALCIUM 8.4* 8.6* 8.4* 8.4*   GFR: Estimated Creatinine Clearance: 60 mL/min (by C-G formula based on SCr of 0.77 mg/dL). Liver Function Tests: Recent Labs  Lab  07/09/18 0757  AST 12*  ALT 9  ALKPHOS 65  BILITOT 0.6  PROT 6.1*  ALBUMIN 3.0*   No results for input(s): LIPASE, AMYLASE in the last 168 hours. No results for input(s): AMMONIA in the last 168 hours. Coagulation Profile: Recent Labs  Lab 07/09/18 0757  INR 1.15   Cardiac Enzymes: Recent Labs  Lab 07/09/18 2024  TROPONINI <0.03   BNP (last 3 results) No results for input(s): PROBNP in the last 8760 hours. HbA1C: No results for input(s): HGBA1C in the last 72 hours. CBG: No results for input(s): GLUCAP in the last 168 hours. Lipid Profile: No results for input(s): CHOL, HDL, LDLCALC, TRIG, CHOLHDL, LDLDIRECT in the last 72 hours. Thyroid Function Tests: No results for input(s): TSH, T4TOTAL, FREET4, T3FREE, THYROIDAB in the last 72 hours. Anemia Panel: No results for input(s): VITAMINB12, FOLATE, FERRITIN, TIBC, IRON, RETICCTPCT in the last 72 hours. Urine analysis:    Component Value Date/Time   COLORURINE YELLOW 07/08/2018 [place] 07/08/2018 2231   LABSPEC 1.025 07/08/2018 2231   PHURINE 7.0 07/08/2018 2231   GLUCOSEU NEGATIVE 07/08/2018 2231   HGBUR SMALL (A) 07/08/2018 2231   BILIRUBINUR NEGATIVE 07/08/2018 2231   BILIRUBINUR neg 07/31/2013 1752   KETONESUR NEGATIVE 07/08/2018 2231   PROTEINUR NEGATIVE 07/08/2018 2231   UROBILINOGEN 0.2 07/31/2013 1752   NITRITE NEGATIVE 07/08/2018 2231   LEUKOCYTESUR NEGATIVE 07/08/2018 2231   Recent Results (from the past 240 hour(s))  Urine culture     Status: None   Collection Time: 07/08/18 10:22 PM  Result Value Ref Range Status   Specimen Description URINE, RANDOM  Final   Special Requests NONE  Final   Culture   Final    NO GROWTH Performed at [facility], [place] [address], [place], [place] [address]    Report Status 07/10/2018 FINAL  Final  Aerobic/Anaerobic Culture (surgical/deep wound)     Status: None (Preliminary result)   Collection Time: 07/14/18  2:08 PM  Result Value Ref  Range Status   Specimen Description ABSCESS ABDOMEN  Final   Special Requests NONE  Final   Gram Stain   Final    ABUNDANT WBC PRESENT, PREDOMINANTLY MONONUCLEAR RARE GRAM POSITIVE RODS Performed at [facility], [place] [address], [place], [place] [address]    Culture PENDING  Incomplete   Report Status PENDING  Incomplete      Radiology Studies: Ct Image Guided Drainage By Percutaneous Catheter  Result Date: 07/14/2018 INDICATION: 72 year old female with a history ruptured appendicitis and abscess EXAM: CT-GUIDED DRAINAGE OF LEFT LOWER QUADRANT ABSCESS MEDICATIONS: The patient is currently admitted to the hospital and receiving intravenous antibiotics. The antibiotics were administered within an appropriate time frame prior to the initiation of the procedure. ANESTHESIA/SEDATION: 1.5 mg IV Versed 150 mcg IV Fentanyl Moderate Sedation Time:  20 minutes The patient was continuously monitored during the procedure by the interventional radiology nurse under my direct supervision. COMPLICATIONS: None TECHNIQUE: Informed written consent was obtained from the patient after a thorough  discussion of the procedural risks, benefits and alternatives. All questions were addressed. Maximal Sterile Barrier Technique was utilized including caps, mask, sterile gowns, sterile gloves, sterile drape, hand hygiene and skin antiseptic. A timeout was performed prior to the initiation of the procedure. PROCEDURE: The operative field was prepped with chlorhexidine in a sterile fashion, and a sterile drape was applied covering the operative field. A sterile gown and sterile gloves were used for the procedure. Local anesthesia was provided with 1% Lidocaine. Once the patient is prepped and draped, 1% lidocaine was used for local anesthesia. CT guidance was used to place a trocar needle into the abscess of the right lower quadrant. Modified Seldinger technique was used to place a 10 [place] drain. Approximately 15 cc of  purulent material aspirated. Sample sent to lab.  Final image was stored. Patient tolerated the procedure well and remained hemodynamically stable throughout. No complications encountered and no significant blood loss. FINDINGS: Scout CT demonstrates fluid and gas collection of the right lower quadrant. Status post drainage there is decreased fluid and gas with drainage catheter formed in the abscess region. Atherosclerosis. Diverticular disease. IMPRESSION: Status post CT-guided drainage of right lower quadrant abscess. Signed, Dulcy Fanny. Dellia Nims, RPVI [facility] Radiology Electronically Signed   By: Corrie Mckusick D.O.   On: 07/14/2018 13:34    Scheduled Meds: . acetaminophen  650 mg Oral Q6H  . amLODipine  10 mg Oral Daily  . aspirin  81 mg Oral Daily  . atorvastatin  40 mg Oral q1800  . clonazePAM  0.5 mg Oral QHS  . DULoxetine  30 mg Oral QHS  . DULoxetine  60 mg Oral BH-q7a  . enoxaparin (LOVENOX) injection  40 mg Subcutaneous Q24H  . lamoTRIgine  100 mg Oral Daily  . losartan  50 mg Oral Daily  . nicotine  21 mg Transdermal Daily  . pantoprazole  40 mg Oral Daily  . polyethylene glycol  17 g Oral Daily  . rOPINIRole  2 mg Oral QHS  . sodium chloride flush  5 mL Intracatheter Q8H   Continuous Infusions: . dextrose 5 % and 0.45% NaCl 125 mL/hr at 07/15/18 0927  . piperacillin-tazobactam (ZOSYN)  IV 3.375 g (07/15/18 1539)     LOS: 7 days   Time spent: 25 minutes.  Patrecia Pour, MD [facility] [email or web address] Password [email or web address] 07/15/2018, 5:13 PM

## 2018-07-15 NOTE — H&P (Signed)
Referring Physician(s): Martin,M  Supervising Physician: Aletta Edouard  Patient Status:  [facility] - In-pt  Chief Complaint:  Appendiceal abscess  Subjective:  Patient c/o some discomfort at the drain site.  Allergies: Patient has no known allergies.  Medications: Prior to Admission medications   Medication Sig Start Date End Date Taking? Authorizing Provider  amLODipine (NORVASC) 10 MG tablet Take 1 tablet (10 mg total) by mouth daily. 10/25/17  Yes Rai, Ripudeep K, MD  aspirin EC 325 MG EC tablet Take 1 tablet (325 mg total) by mouth daily. 10/25/17  Yes Rai, Ripudeep K, MD  bisacodyl (DULCOLAX) 5 MG EC tablet Take 10 mg by mouth at bedtime.    Yes [provider]  CASCARA SAGRADA PO Take 2 capsules by mouth at bedtime.   Yes [provider]  clonazePAM (KLONOPIN) 0.5 MG tablet Take 0.5 mg by mouth at bedtime.   Yes [provider]  DULoxetine (CYMBALTA) 30 MG capsule 30 mg at bedtime.   Yes [provider]  DULoxetine (CYMBALTA) 60 MG capsule Take 60 mg by mouth every morning.    Yes [provider]  famotidine (PEPCID) 20 MG tablet One at bedtime 02/14/16  Yes Tanda Rockers, MD  lamoTRIgine (LAMICTAL) 100 MG tablet Take 100 mg by mouth daily.  12/18/17  Yes [provider]  linaclotide Rolan Lipa) 145 MCG CAPS capsule Take 1 capsule (145 mcg total) by mouth daily before breakfast. 11/28/17  Yes Armbruster, Carlota Raspberry, MD  losartan (COZAAR) 50 MG tablet Take 1 tablet (50 mg total) by mouth daily. 10/25/17  Yes Rai, Ripudeep K, MD  meclizine (ANTIVERT) 12.5 MG tablet Take 1 tablet (12.5 mg total) by mouth 3 (three) times daily. 05/23/18  Yes Kathrynn Ducking, MD  polyethylene glycol [facility] / Floria Raveling) packet Take 34 g by mouth every other day.    Yes [provider]  rOPINIRole (REQUIP) 2 MG tablet TAKE 1 TABLET BY MOUTH AT BEDTIME Patient taking differently: Take 2 mg by mouth at bedtime.  05/27/12  Yes Dunn, Areta Haber, PA-C    senna (SENOKOT) 8.6 MG TABS tablet Take 2 tablets by mouth at bedtime.    Yes [provider]  mupirocin ointment (BACTROBAN) 2 % Apply 1 application topically 2 (two) times daily. Patient not taking: Reported on 07/08/2018 05/30/18   [name], MD     Vital Signs: BP 133/73 (BP Location: Right Arm)   Pulse 69   Temp 97.9 F (36.6 C) (Oral)   Resp 18   Ht 5\' 7"  (1.702 m)   Wt 70.3 kg   SpO2 97%   BMI 24.28 kg/m   Physical Exam  Cardiovascular: Normal rate.  Pulmonary/Chest: Effort normal. No respiratory distress.  Abdominal: Soft. There is tenderness.  Drain in place ~45 mL milky tan output  Vitals reviewed.   Cultures pending Gram stain G+ Rods  Imaging: Ct Abdomen Pelvis W Contrast  Result Date: 07/13/2018 CLINICAL DATA:  Worsening abdominal pain, fever and chills. Appendiceal abscess. EXAM: CT ABDOMEN AND PELVIS WITH CONTRAST TECHNIQUE: Multidetector CT imaging of the abdomen and pelvis was performed using the standard protocol following bolus administration of intravenous contrast. CONTRAST:  151mL OMNIPAQUE IOHEXOL 300 MG/ML  SOLN COMPARISON:  07/08/2018 FINDINGS: Lower chest: Calcific atherosclerotic disease of the aorta and coronary arteries. Mildly enlarged heart. Hepatobiliary: No focal liver abnormality is seen. No gallstones, gallbladder wall thickening, or biliary dilatation. Pancreas: Unremarkable. No pancreatic ductal dilatation or surrounding inflammatory changes. Spleen: Normal  in size without focal abnormality. Adrenals/Urinary Tract: Adrenal glands are unremarkable. Kidneys are without renal calculi, focal lesion, or hydronephrosis. Benign-appearing left renal cyst. Other bilateral subcentimeter probable renal cysts, too small to be actually characterize by CT. Tenting of the right anterior surface of the urinary bladder, and mucosal thickening, adjacent to the appendiceal abscess is noted. Stomach/Bowel: The stomach is normal. No evidence of  small-bowel obstruction. Normal colonic bowel pattern. The previously demonstrated appendiceal thick-walled gas and fluid collection in the right lower quadrant of the abdomen has enlarged and measures 5.0 x 4.8 x 4.3 Cm. Vascular/Lymphatic: No adenopathy. Infrarenal abdominal aortic aneurysm measures 3.1 cm. Calcific atherosclerotic disease of the aorta and tortuosity. Reproductive: Status post hysterectomy. No adnexal masses. Other: No free fluid or free gas within the abdomen outside of the loculated right lower quadrant abscess. Musculoskeletal: Spondylosis of the lumbosacral spine. IMPRESSION: Interval enlargement of the known right appendiceal abscess, which now measures 5 cm in greatest dimension. Likely reactive tenting and mucosal thickening of the adjacent urinary bladder. No free fluid or free gas outside of the abscess collection. Infrarenal abdominal aortic aneurysm measures 3.1 cm. Recommend followup by ultrasound in 3 years. This recommendation follows ACR consensus guidelines: White Paper of the ACR Incidental Findings Committee II on Vascular Findings. [facility]iol 2013; 10:789-794 Electronically Signed   By: Fidela Salisbury M.D.   On: 07/13/2018 15:59   Ct Image Guided Drainage By Percutaneous Catheter  Result Date: 07/14/2018 INDICATION: 72 year old female with a history ruptured appendicitis and abscess EXAM: CT-GUIDED DRAINAGE OF LEFT LOWER QUADRANT ABSCESS MEDICATIONS: The patient is currently admitted to the hospital and receiving intravenous antibiotics. The antibiotics were administered within an appropriate time frame prior to the initiation of the procedure. ANESTHESIA/SEDATION: 1.5 mg IV Versed 150 mcg IV Fentanyl Moderate Sedation Time:  20 minutes The patient was continuously monitored during the procedure by the interventional radiology nurse under my direct supervision. COMPLICATIONS: None TECHNIQUE: Informed written consent was obtained from the patient after a thorough  discussion of the procedural risks, benefits and alternatives. All questions were addressed. Maximal Sterile Barrier Technique was utilized including caps, mask, sterile gowns, sterile gloves, sterile drape, hand hygiene and skin antiseptic. A timeout was performed prior to the initiation of the procedure. PROCEDURE: The operative field was prepped with chlorhexidine in a sterile fashion, and a sterile drape was applied covering the operative field. A sterile gown and sterile gloves were used for the procedure. Local anesthesia was provided with 1% Lidocaine. Once the patient is prepped and draped, 1% lidocaine was used for local anesthesia. CT guidance was used to place a trocar needle into the abscess of the right lower quadrant. Modified Seldinger technique was used to place a 10 [place] drain. Approximately 15 cc of purulent material aspirated. Sample sent to lab.  Final image was stored. Patient tolerated the procedure well and remained hemodynamically stable throughout. No complications encountered and no significant blood loss. FINDINGS: Scout CT demonstrates fluid and gas collection of the right lower quadrant. Status post drainage there is decreased fluid and gas with drainage catheter formed in the abscess region. Atherosclerosis. Diverticular disease. IMPRESSION: Status post CT-guided drainage of right lower quadrant abscess. Signed, Dulcy Fanny. Dellia Nims, RPVI [facility] [address] [facility] Radiology Electronically Signed   By: Corrie Mckusick D.O.   On: 07/14/2018 13:34    Labs:  CBC: Recent Labs    03/05/18 1758 04/30/18 1628 07/08/18 1344 07/09/18 0757 07/11/18 0239  WBC 7.4  7.9 11.5* 13.3* 9.2  HGB 13.6 13.1 12.6 12.0 10.9*  HCT 41.9 40.8 40.8 39.0 35.3*  PLT 241  --  252 213 208    COAGS: Recent Labs    10/23/17 1630 07/09/18 0757  INR 1.01 1.15  APTT 34  --     BMP: Recent Labs    07/09/18 0757 07/12/18 0146 07/14/18 0530 07/15/18 0541  NA  133* 135 133* 134*  K 3.6 3.0* 3.2* 3.4*  CL 98 97* 98 100  CO2 27 28 32 29  GLUCOSE 143* 108* 124* 134*  BUN 6* 5* 6* 6*  CALCIUM 8.4* 8.6* 8.4* 8.4*  CREATININE 1.02* 0.85 0.84 0.77  GFRNONAA 53* >60 >60 >60  GFRAA >60 >60 >60 >60    LIVER FUNCTION TESTS: Recent Labs    10/23/17 1630 03/05/18 1758 07/08/18 1344 07/09/18 0757  BILITOT 0.4 0.3 0.8 0.6  AST 21 16 15  12*  ALT 19 14 11 9   ALKPHOS 65 70 66 65  PROT 6.5 7.4 7.0 6.1*  ALBUMIN 3.5 4.4 3.6 3.0*    Assessment and Plan:  Appendiceal abscess  S/P drain placement by Dr.  Earleen Newport 07/14/2018.  Continue routine drain care, flushes, documenting output.  Repeat CT scan when output <10 mL daily  Await cultures.  IR PA will arrange outpatient drain clinic follow up upon patients discharge.  Electronically Signed: Murrell Redden, PA-C 07/15/2018, 11:17 AM    I spent a total of 15 Minutes at the the patient's bedside AND on the patient's hospital floor or unit, greater than 50% of which was counseling/coordinating care for f/u drain placement.

## 2018-07-15 NOTE — Progress Notes (Signed)
Patient ID: [name], female   DOB: 11/26/1945, 72 y.o.   MRN: [id number]       Subjective: Pt still has pain in RLQ but now states its bc of her drain.  Eating well with no issues.  Objective: Vital signs in last 24 hours: Temp:  [97.9 F (36.6 C)-98.8 F (37.1 C)] 97.9 F (36.6 C) (11/04 0507) Pulse Rate:  [68-78] 69 (11/04 0507) Resp:  [12-18] 18 (11/04 0507) BP: (122-153)/(64-73) 133/73 (11/04 0507) SpO2:  [95 %-100 %] 97 % (11/04 0507) Last BM Date: 07/13/18  Intake/Output from previous day: 11/03 0701 - 11/04 0700 In: 3358.2 [P.O.:540; I.V.:2616.7; IV Piggyback:191.4] Out: 45 [Drains:45] Intake/Output this shift: No intake/output data recorded.  PE: Abd: soft, appropriately tender in RLQ with drain in place with seropurulent output and slightly pink tinge from blood.  +BS, otherwise completely benign abdomen.  Lab Results:  No results for input(s): WBC, HGB, HCT, PLT in the last 72 hours. BMET Recent Labs    07/14/18 0530 07/15/18 0541  NA 133* 134*  K 3.2* 3.4*  CL 98 100  CO2 32 29  GLUCOSE 124* 134*  BUN 6* 6*  CREATININE 0.84 0.77  CALCIUM 8.4* 8.4*   PT/INR No results for input(s): LABPROT, INR in the last 72 hours. CMP     Component Value Date/Time   NA 134 (L) 07/15/2018 0541   NA 139 03/05/2018 1758   K 3.4 (L) 07/15/2018 0541   CL 100 07/15/2018 0541   CO2 29 07/15/2018 0541   GLUCOSE 134 (H) 07/15/2018 0541   BUN 6 (L) 07/15/2018 0541   BUN 13 03/05/2018 1758   CREATININE 0.77 07/15/2018 0541   CREATININE 0.77 07/28/2015 1805   CALCIUM 8.4 (L) 07/15/2018 0541   PROT 6.1 (L) 07/09/2018 0757   PROT 7.4 03/05/2018 1758   ALBUMIN 3.0 (L) 07/09/2018 0757   ALBUMIN 4.4 03/05/2018 1758   AST 12 (L) 07/09/2018 0757   ALT 9 07/09/2018 0757   ALKPHOS 65 07/09/2018 0757   BILITOT 0.6 07/09/2018 0757   BILITOT 0.3 03/05/2018 1758   GFRNONAA >60 07/15/2018 0541   GFRAA >60 07/15/2018 0541   Lipase     Component Value Date/Time   LIPASE 22 07/08/2018 1344       Studies/Results: Ct Abdomen Pelvis W Contrast  Result Date: 07/13/2018 CLINICAL DATA:  Worsening abdominal pain, fever and chills. Appendiceal abscess. EXAM: CT ABDOMEN AND PELVIS WITH CONTRAST TECHNIQUE: Multidetector CT imaging of the abdomen and pelvis was performed using the standard protocol following bolus administration of intravenous contrast. CONTRAST:  137mL OMNIPAQUE IOHEXOL 300 MG/ML  SOLN COMPARISON:  07/08/2018 FINDINGS: Lower chest: Calcific atherosclerotic disease of the aorta and coronary arteries. Mildly enlarged heart. Hepatobiliary: No focal liver abnormality is seen. No gallstones, gallbladder wall thickening, or biliary dilatation. Pancreas: Unremarkable. No pancreatic ductal dilatation or surrounding inflammatory changes. Spleen: Normal in size without focal abnormality. Adrenals/Urinary Tract: Adrenal glands are unremarkable. Kidneys are without renal calculi, focal lesion, or hydronephrosis. Benign-appearing left renal cyst. Other bilateral subcentimeter probable renal cysts, too small to be actually characterize by CT. Tenting of the right anterior surface of the urinary bladder, and mucosal thickening, adjacent to the appendiceal abscess is noted. Stomach/Bowel: The stomach is normal. No evidence of small-bowel obstruction. Normal colonic bowel pattern. The previously demonstrated appendiceal thick-walled gas and fluid collection in the right lower quadrant of the abdomen has enlarged and measures 5.0 x 4.8 x 4.3 Cm. Vascular/Lymphatic: No adenopathy. Infrarenal abdominal  aortic aneurysm measures 3.1 cm. Calcific atherosclerotic disease of the aorta and tortuosity. Reproductive: Status post hysterectomy. No adnexal masses. Other: No free fluid or free gas within the abdomen outside of the loculated right lower quadrant abscess. Musculoskeletal: Spondylosis of the lumbosacral spine. IMPRESSION: Interval enlargement of the known right appendiceal  abscess, which now measures 5 cm in greatest dimension. Likely reactive tenting and mucosal thickening of the adjacent urinary bladder. No free fluid or free gas outside of the abscess collection. Infrarenal abdominal aortic aneurysm measures 3.1 cm. Recommend followup by ultrasound in 3 years. This recommendation follows ACR consensus guidelines: White Paper of the ACR Incidental Findings Committee II on Vascular Findings. [facility] Radiol 2013; 10:789-794 Electronically Signed   By: Fidela Salisbury M.D.   On: 07/13/2018 15:59   Ct Image Guided Drainage By Percutaneous Catheter  Result Date: 07/14/2018 INDICATION: 72 year old female with a history ruptured appendicitis and abscess EXAM: CT-GUIDED DRAINAGE OF LEFT LOWER QUADRANT ABSCESS MEDICATIONS: The patient is currently admitted to the hospital and receiving intravenous antibiotics. The antibiotics were administered within an appropriate time frame prior to the initiation of the procedure. ANESTHESIA/SEDATION: 1.5 mg IV Versed 150 mcg IV Fentanyl Moderate Sedation Time:  20 minutes The patient was continuously monitored during the procedure by the interventional radiology nurse under my direct supervision. COMPLICATIONS: None TECHNIQUE: Informed written consent was obtained from the patient after a thorough discussion of the procedural risks, benefits and alternatives. All questions were addressed. Maximal Sterile Barrier Technique was utilized including caps, mask, sterile gowns, sterile gloves, sterile drape, hand hygiene and skin antiseptic. A timeout was performed prior to the initiation of the procedure. PROCEDURE: The operative field was prepped with chlorhexidine in a sterile fashion, and a sterile drape was applied covering the operative field. A sterile gown and sterile gloves were used for the procedure. Local anesthesia was provided with 1% Lidocaine. Once the patient is prepped and draped, 1% lidocaine was used for local anesthesia. CT  guidance was used to place a trocar needle into the abscess of the right lower quadrant. Modified Seldinger technique was used to place a 10 [place] drain. Approximately 15 cc of purulent material aspirated. Sample sent to lab.  Final image was stored. Patient tolerated the procedure well and remained hemodynamically stable throughout. No complications encountered and no significant blood loss. FINDINGS: Scout CT demonstrates fluid and gas collection of the right lower quadrant. Status post drainage there is decreased fluid and gas with drainage catheter formed in the abscess region. Atherosclerosis. Diverticular disease. IMPRESSION: Status post CT-guided drainage of right lower quadrant abscess. Signed, Dulcy Fanny. Dellia Nims, RPVI [facility] Electronically Signed   By: Corrie Mckusick D.O.   On: 07/14/2018 13:34    Anti-infectives: Anti-infectives (From admission, onward)   Start     Dose/Rate Route Frequency Ordered Stop   07/09/18 0700  piperacillin-tazobactam (ZOSYN) IVPB 3.375 g     3.375 g 12.5 mL/hr over 240 Minutes Intravenous Every 8 hours 07/09/18 0645     07/08/18 1715  piperacillin-tazobactam (ZOSYN) IVPB 3.375 g     3.375 g 12.5 mL/hr over 240 Minutes Intravenous  Once 07/08/18 1711 07/08/18 2319       Assessment/Plan IBS-C Chest pain - resolved HTN hypothyroidism Bipolar disorder PMH CVA  PAD Melena - will need further GI workup after acute appendicitis improves, hct/hct normal, CEA2.7.  Acute perforated appendicitis with abscess - repeat CT over weekend revealed enlargement of this collection and  amenable to drainage which was done yesterday. -CX is showing gram + rods.  Would like cultures to mature more before discharge home to make sure her abx coverage is appropriate. - afebrile, VSS,Leukocytosis resolved -  IV Zosyn - SOFT diet   FEN -SOFT, IVF  ID - Zosyn 10.28 >> VTE - SCD's,Lovenox  LOS: 7  days    Henreitta Cea , [name] 07/15/2018, 10:53 AM Pager: [phone]

## 2018-07-15 NOTE — Plan of Care (Signed)

## 2018-07-15 NOTE — Care Management Note (Signed)
Case Management Note  Patient Details  Name: NATALYN SZYMANOWSKI MRN: [id number] Date of Birth: 12-14-1945  Subjective/Objective:                    Action/Plan:  Confirmed face sheet information with patient. Office offered patient would like [place] referral given to Leon with [facility].   Ordered walker , patient does not want 3 in 1 .   Patient from home with husband. Expected Discharge Date:                  Expected Discharge Plan:  [place]  In-House Referral:  NA  Discharge planning Services  CM Consult  Post Acute Care Choice:  Durable Medical Equipment Choice offered to:  Patient  DME Arranged:  Walker rolling DME Agency:  [place] Arranged:  RN, PT [facility] Agency:  [place]  Status of Service:  Completed, signed off  If discussed at [place] of Stay Meetings, dates discussed:    Additional Comments:  Marilu Favre, RN 07/15/2018, 12:13 PM

## 2018-07-15 NOTE — Progress Notes (Signed)
Physical Therapy Treatment Patient Details Name: [name] MRN: [id number] DOB: [birth year] Today's Date: 07/15/2018    History of Present Illness Patient is a 72 y/o female who presents with RLQ pain, found to have Acute perforated appendicitis with abscess. PMh includes stroke, HTN, CAD, bipolar disorder.     PT Comments    Patient seen for mobility progression. Pt requires increased assist for bed mobility and functional transfers due to increased abdominal pain from drain placement. RW used for gait training this session and pt is more steady and requires less external assistance with bilat UE support. Pt agreeable to RW use upon d/c. Husband present. Continue to progress as tolerated.   Follow Up Recommendations  Supervision for mobility/OOB;Home health PT     Equipment Recommendations  Rolling walker with 5" wheels    Recommendations for Other Services       Precautions / Restrictions Precautions Precautions: Fall    Mobility  Bed Mobility Overal bed mobility: Needs Assistance Bed Mobility: Rolling;Sidelying to Sit Rolling: Supervision Sidelying to sit: Min assist       General bed mobility comments: use of rail; cues for sequencing and assist to elevate trunk into sitting due to abdominal pain with transitional movements  Transfers Overall transfer level: Needs assistance Equipment used: Rolling walker (2 wheeled) Transfers: Sit to/from Stand Sit to Stand: Min assist         General transfer comment: assist to steady; cues for safe hand placement  Ambulation/Gait Ambulation/Gait assistance: Min assist;Min guard Gait Distance (Feet): 200 Feet Assistive device: Rolling walker (2 wheeled) Gait Pattern/deviations: Step-through pattern;Decreased stride length Gait velocity: decreased   General Gait Details: cues for posture and safe use of AD; grossly steady gait with bilat UE support but with LOB X1 when turning   Stairs             Wheelchair  Mobility    Modified Rankin (Stroke Patients Only)       Balance Overall balance assessment: Needs assistance Sitting-balance support: No upper extremity supported;Feet supported Sitting balance-Leahy Scale: Good     Standing balance support: During functional activity;Bilateral upper extremity supported Standing balance-Leahy Scale: Poor                              Cognition Arousal/Alertness: Awake/alert Behavior During Therapy: WFL for tasks assessed/performed Overall Cognitive Status: Within Functional Limits for tasks assessed Area of Impairment: Safety/judgement                         Safety/Judgement: Decreased awareness of safety;Decreased awareness of deficits            Exercises      General Comments        Pertinent Vitals/Pain Pain Assessment: Faces Faces Pain Scale: (4-6 with mobility) Pain Location: abdomen; incision site Pain Descriptors / Indicators: Grimacing;Guarding;Sore Pain Intervention(s): Monitored during session;Premedicated before session;Repositioned    Home Living                      Prior Function            PT Goals (current goals can now be found in the care plan section) Progress towards PT goals: Progressing toward goals    Frequency    Min 3X/week      PT Plan Current plan remains appropriate    Co-evaluation  AM-PAC PT "6 Clicks" Daily Activity  Outcome Measure  Difficulty turning over in bed (including adjusting bedclothes, sheets and blankets)?: Unable Difficulty moving from lying on back to sitting on the side of the bed? : Unable Difficulty sitting down on and standing up from a chair with arms (e.g., wheelchair, bedside commode, etc,.)?: Unable Help needed moving to and from a bed to chair (including a wheelchair)?: A Little Help needed walking in hospital room?: A Little Help needed climbing 3-5 steps with a railing? : A Little 6 Click Score: 12     End of Session Equipment Utilized During Treatment: Gait belt Activity Tolerance: Patient tolerated treatment well Patient left: in chair;with call bell/phone within reach;with family/visitor present Nurse Communication: Mobility status PT Visit Diagnosis: Unsteadiness on feet (R26.81);Difficulty in walking, not elsewhere classified (R26.2)     Time: 0762-2633 PT Time Calculation (min) (ACUTE ONLY): 24 min  Charges:  $Gait Training: 8-22 mins $Therapeutic Activity: 8-22 mins                     Earney Navy, PTA Acute Rehabilitation Services Pager: [phone] Office: [phone]     Darliss Cheney 07/15/2018, 3:14 PM

## 2018-07-16 MED ORDER — OXYCODONE HCL 5 MG PO TABS: 5.0000 mg | ORAL_TABLET | Freq: Four times a day (QID) | ORAL | 0 refills | Status: AC | PRN

## 2018-07-16 MED ORDER — AMOXICILLIN-POT CLAVULANATE 875-125 MG PO TABS: 1.0000 | ORAL_TABLET | Freq: Two times a day (BID) | ORAL | 0 refills | Status: AC

## 2018-07-16 MED ORDER — MAGNESIUM CITRATE PO SOLN
1.0000 | Freq: Once | ORAL | Status: AC
  Administered 2018-07-16: 1 via ORAL
  Filled 2018-07-16: qty 296

## 2018-07-16 MED ORDER — POTASSIUM CHLORIDE CRYS ER 20 MEQ PO TBCR
40.0000 meq | EXTENDED_RELEASE_TABLET | Freq: Once | ORAL | Status: DC
  Filled 2018-07-16: qty 2

## 2018-07-16 NOTE — Discharge Summary (Signed)
Physician Discharge Summary  TOYIA JELINEK XFG:[id number] DOB: [birth year] DOA: 07/08/2018  PCP: [name], MD  Admit date: 07/08/2018 Discharge date: 07/16/2018  Admitted From: Home Disposition: Home   Recommendations for Outpatient Follow-up:  1. Follow up with IR in drain clinic as they will arrange 2. Follow upw ith surgery following discharge.  Home Health: PT, RN Equipment/Devices: rolling walker Discharge Condition: Stable CODE STATUS: Full Diet recommendation: Heart healthy  Brief/Interim Summary: [name] is a 72 y.o. female with a history of IBS-C, HTN, hypothyroidism, hx CVA/TIA, and bipolar disorder who rpesented 10/28 with 2 weeks of progressively worsening abdominal cramping/pain becoming increasingly localized to the RLQ found to have ruptured appendicitis with small abscess. Surgery was consulted, recommended conservative therapy with IV antibiotics. Repeat CT 11/2 showed interval enlargement of RLQ abscess and IR reconsulted. Where there was initially no window for drain placement, this became possible and drain was placed 11/3. Culture results remain pending with GPR's on gram stain. She has defervesced with normalized WBC on zosyn so will be discharged on augmentin.   Discharge Diagnoses:  Principal Problem:   Appendiceal abscess Active Problems:   RLS (restless legs syndrome)   Postinflammatory pulmonary fibrosis (HCC)   Smoker   Hyperlipidemia   PAD (peripheral artery disease) (HCC)  Acute perforated appendicitis with abscess: s/p perc drain 11/3 by IR. - Routine drain care per IR. Will need teaching prior to discharge and follow up in drain clinic.  - Has significantly improved on zosyn, will discharge on augmentin for total 14 days Tx.  - Discussed with surgery, will need follow up after discharge.   IBS-C:  - Restarted home miralax, constipation improved - Minimize narcotics as able.  Epigastric/chest pain: Not consistent with ACS,  resolved. - Continue PPI  HTN:  - Continue norvasc, restarted ARB  Hypokalemia:  - Replaced and improved  Bipolar disorder: Stable. - Continue cymbalta, clonazepam, lamictal  History of CVA, TIA and PAD:  - Continue ASA, statin.  Tobacco use:  - Nicotine patch  Discharge Instructions Discharge Instructions    Diet - low sodium heart healthy   Complete by:  As directed    Discharge instructions   Complete by:  As directed    You will need to follow up with surgery as scheduled and with interventional radiology in drain clinic. This will be arranged, but please call the number below if you are not contacted for an appointment. Follow routine drain care as taught prior to discharge.  - Take augmentin twice daily for the next 7 days to complete treatment for the infection - If your symptoms WORSEN seek medical attention right away.   Increase activity slowly   Complete by:  As directed      Allergies as of 07/16/2018   No Known Allergies     Medication List    STOP taking these medications   atorvastatin 40 MG tablet Commonly known as:  LIPITOR   cephALEXin 500 MG capsule Commonly known as:  KEFLEX     TAKE these medications   amLODipine 10 MG tablet Commonly known as:  NORVASC Take 1 tablet (10 mg total) by mouth daily.   amoxicillin-clavulanate 875-125 MG tablet Commonly known as:  AUGMENTIN Take 1 tablet by mouth every 12 (twelve) hours for 7 days.   aspirin 325 MG EC tablet Take 1 tablet (325 mg total) by mouth daily.   bisacodyl 5 MG EC tablet Commonly known as:  DULCOLAX Take 10 mg by mouth  at bedtime.   CASCARA SAGRADA PO Take 2 capsules by mouth at bedtime.   clonazePAM 0.5 MG tablet Commonly known as:  KLONOPIN Take 0.5 mg by mouth at bedtime.   DULoxetine 60 MG capsule Commonly known as:  CYMBALTA Take 60 mg by mouth every morning.   DULoxetine 30 MG capsule Commonly known as:  CYMBALTA 30 mg at bedtime.   famotidine 20 MG  tablet Commonly known as:  PEPCID One at bedtime   lamoTRIgine 100 MG tablet Commonly known as:  LAMICTAL Take 100 mg by mouth daily.   linaclotide 145 MCG Caps capsule Commonly known as:  LINZESS Take 1 capsule (145 mcg total) by mouth daily before breakfast.   losartan 50 MG tablet Commonly known as:  COZAAR Take 1 tablet (50 mg total) by mouth daily.   meclizine 12.5 MG tablet Commonly known as:  ANTIVERT Take 1 tablet (12.5 mg total) by mouth 3 (three) times daily.   mupirocin ointment 2 % Commonly known as:  BACTROBAN Apply 1 application topically 2 (two) times daily.   oxyCODONE 5 MG immediate release tablet Commonly known as:  Oxy IR/ROXICODONE Take 1 tablet (5 mg total) by mouth every 6 (six) hours as needed for up to 5 days for moderate pain or severe pain.   polyethylene glycol packet Commonly known as:  MIRALAX / GLYCOLAX Take 34 g by mouth every other day.   rOPINIRole 2 MG tablet Commonly known as:  REQUIP TAKE 1 TABLET BY MOUTH AT BEDTIME   senna 8.6 MG Tabs tablet Commonly known as:  SENOKOT Take 2 tablets by mouth at bedtime.            Durable Medical Equipment  (From admission, onward)         Start     Ordered   07/15/18 1216  For home use only DME Walker rolling  Once    Question:  Patient needs a walker to treat with the following condition  Answer:  Acute perforated appendicitis   07/15/18 1216         Follow-up Information    [facility] Follow up.   Specialty:  Home Health Services Why:  Provide home health  Contact information: [place] [address] [phone]        Erroll Luna, MD. Go on 08/05/2018.   Specialty:  General Surgery Why:  Your appointment is 08/05/18 at 11:50AM. Follow up after appointment in drain clinic. Please arrive 30 minutes early to check in and fill out paperwork. Bring photo ID and insurance information. Contact information: [address] [place] [address] [phone]        [name], MD Follow up.   Specialties:  Family Medicine, Sports Medicine Contact information: [place] [address] [phone]        [place]. Call.   Specialty:  Radiology Contact information: [address] [id number] Danice Goltz [place] [phone]         No Known Allergies  Consultations:  General surgery  Interventional radiology  Procedures/Studies: Dg Chest 2 View  Result Date: 07/08/2018 CLINICAL DATA:  Cough for a year. History of chronic interstitial lung disease bronchitis. EXAM: CHEST - 2 VIEW COMPARISON:  Chest radiograph October 24, 2017 and CT chest Jan 20, 2016. FINDINGS: Similar chronic interstitial changes confluent in lung bases. No pleural effusion or focal consolidation. Cardiomediastinal silhouette is normal. Calcified aortic  arch. No pneumothorax. Soft tissue planes and included osseous structures are non suspicious. Moderate degenerative change of the thoracic spine. IMPRESSION: Chronic interstitial changes without focal consolidation. Aortic Atherosclerosis (ICD10-I70.0). Electronically Signed   By: Elon Alas M.D.   On: 07/08/2018 15:03   Ct Abdomen Pelvis W Contrast  Result Date: 07/13/2018 CLINICAL DATA:  Worsening abdominal pain, fever and chills. Appendiceal abscess. EXAM: CT ABDOMEN AND PELVIS WITH CONTRAST TECHNIQUE: Multidetector CT imaging of the abdomen and pelvis was performed using the standard protocol following bolus administration of intravenous contrast. CONTRAST:  117mL OMNIPAQUE IOHEXOL 300 MG/ML  SOLN COMPARISON:  07/08/2018 FINDINGS: Lower chest: Calcific atherosclerotic disease of the aorta and coronary arteries. Mildly enlarged heart. Hepatobiliary: No focal liver abnormality is seen. No gallstones, gallbladder wall thickening, or biliary dilatation. Pancreas: Unremarkable. No  pancreatic ductal dilatation or surrounding inflammatory changes. Spleen: Normal in size without focal abnormality. Adrenals/Urinary Tract: Adrenal glands are unremarkable. Kidneys are without renal calculi, focal lesion, or hydronephrosis. Benign-appearing left renal cyst. Other bilateral subcentimeter probable renal cysts, too small to be actually characterize by CT. Tenting of the right anterior surface of the urinary bladder, and mucosal thickening, adjacent to the appendiceal abscess is noted. Stomach/Bowel: The stomach is normal. No evidence of small-bowel obstruction. Normal colonic bowel pattern. The previously demonstrated appendiceal thick-walled gas and fluid collection in the right lower quadrant of the abdomen has enlarged and measures 5.0 x 4.8 x 4.3 Cm. Vascular/Lymphatic: No adenopathy. Infrarenal abdominal aortic aneurysm measures 3.1 cm. Calcific atherosclerotic disease of the aorta and tortuosity. Reproductive: Status post hysterectomy. No adnexal masses. Other: No free fluid or free gas within the abdomen outside of the loculated right lower quadrant abscess. Musculoskeletal: Spondylosis of the lumbosacral spine. IMPRESSION: Interval enlargement of the known right appendiceal abscess, which now measures 5 cm in greatest dimension. Likely reactive tenting and mucosal thickening of the adjacent urinary bladder. No free fluid or free gas outside of the abscess collection. Infrarenal abdominal aortic aneurysm measures 3.1 cm. Recommend followup by ultrasound in 3 years. This recommendation follows ACR consensus guidelines: White Paper of the ACR Incidental Findings Committee II on Vascular Findings. Natasha Mead Coll Radiol 2013; 10:789-794 Electronically Signed   By: Fidela Salisbury M.D.   On: 07/13/2018 15:59   Ct Abdomen Pelvis W Contrast  Result Date: 07/08/2018 CLINICAL DATA:  Progressive right lower quadrant abdominal pain with fever and chills. EXAM: CT ABDOMEN AND PELVIS WITH CONTRAST  TECHNIQUE: Multidetector CT imaging of the abdomen and pelvis was performed using the standard protocol following bolus administration of intravenous contrast. CONTRAST:  63mL OMNIPAQUE IOHEXOL 300 MG/ML SOLN, 48mL OMNIPAQUE IOHEXOL 300 MG/ML SOLN COMPARISON:  Multiple exams, including 11/05/2009 FINDINGS: Lower chest: Mild peripheral interstitial accentuation in the lung bases favoring the lower lobes, new compared to 11/05/2009 and mildly progressive compared to 01/20/2016, with subpleural reticulation suggesting UIP. Descending thoracic aortic atherosclerotic calcification. Right coronary artery atherosclerotic calcification. Hepatobiliary: Unremarkable Pancreas: Unremarkable Spleen: Unremarkable Adrenals/Urinary Tract: Left kidney lower pole benign cyst. Hypodense lesions in the right kidney are technically too small to characterize although statistically likely to be cysts. Stomach/Bowel: A tubular structure below the cecum separate from the terminal ileum and likely representing inflamed appendix measures up to 1.5 cm in diameter. There is an adjacent collection of gas and fluid measuring 3.4 by 1.9 by 2.6 cm compatible with periappendiceal abscess. The extraluminal gas appears contained and I do not see any other extraluminal gas or truly free air. Vascular/Lymphatic: Infrarenal abdominal aortic aneurysm  3.1 cm transverse on image 83/6. Aortoiliac atherosclerotic vascular disease. Reproductive: Unremarkable Other: No supplemental non-categorized findings. Musculoskeletal: Lumbar scoliosis, spondylosis, and degenerative disc disease. IMPRESSION: 1. Acute appendicitis with periappendiceal stranding and a periappendiceal abscess containing gas and fluid measuring up to 3.4 cm in diameter, favoring early rupture. Aside from this abscess there is no other extraluminal gas. 2. 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 3. Infrarenal abdominal aortic aneurysm 3.1 cm in diameter. Recommend  followup by [place] in 3 years. This recommendation follows ACR consensus guidelines: White Paper of the ACR Incidental Findings Committee II on Vascular Findings. J Am Coll Radiol 2013; 17:793-903 4. Other imaging findings of potential clinical significance: Aortic Atherosclerosis (ICD10-I70.0). Lumbar scoliosis, spondylosis, and degenerative disc disease. Right coronary artery atherosclerosis. Electronically Signed   By: Van Clines M.D.   On: 07/08/2018 16:54   Ct Image Guided Drainage By Percutaneous Catheter  Result Date: 07/14/2018 INDICATION: 72 year old female with a history ruptured appendicitis and abscess EXAM: CT-GUIDED DRAINAGE OF LEFT LOWER QUADRANT ABSCESS MEDICATIONS: The patient is currently admitted to the hospital and receiving intravenous antibiotics. The antibiotics were administered within an appropriate time frame prior to the initiation of the procedure. ANESTHESIA/SEDATION: 1.5 mg IV Versed 150 mcg IV Fentanyl Moderate Sedation Time:  20 minutes The patient was continuously monitored during the procedure by the interventional radiology nurse under my direct supervision. COMPLICATIONS: None TECHNIQUE: Informed written consent was obtained from the patient after a thorough discussion of the procedural risks, benefits and alternatives. All questions were addressed. Maximal Sterile Barrier Technique was utilized including caps, mask, sterile gowns, sterile gloves, sterile drape, hand hygiene and skin antiseptic. A timeout was performed prior to the initiation of the procedure. PROCEDURE: The operative field was prepped with chlorhexidine in a sterile fashion, and a sterile drape was applied covering the operative field. A sterile gown and sterile gloves were used for the procedure. Local anesthesia was provided with 1% Lidocaine. Once the patient is prepped and draped, 1% lidocaine was used for local anesthesia. CT guidance was used to place a trocar needle into the abscess of the right  lower quadrant. Modified Seldinger technique was used to place a 10 [place] drain. Approximately 15 cc of purulent material aspirated. Sample sent to lab.  Final image was stored. Patient tolerated the procedure well and remained hemodynamically stable throughout. No complications encountered and no significant blood loss. FINDINGS: Scout CT demonstrates fluid and gas collection of the right lower quadrant. Status post drainage there is decreased fluid and gas with drainage catheter formed in the abscess region. Atherosclerosis. Diverticular disease. IMPRESSION: Status post CT-guided drainage of right lower quadrant abscess. Signed, Dulcy Fanny. Dellia Nims, RPVI [facility] Electronically Signed   By: Corrie Mckusick D.O.   On: 07/14/2018 13:34     RLQ drain placement by IR, Dr. Earleen Newport 07/14/2018  Subjective: Pain is better controlled, no IV pain medications needed. Eating ok, no fevers, getting around ok. Wants to go home. Feels generally well.   Discharge Exam: Vitals:   07/16/18 0510 07/16/18 0520  BP: (!) 157/78   Pulse: 68   Resp:    Temp: 98 F (36.7 C)   SpO2: (!) 87% 95%   General: Pt is alert, awake, not in acute distress Cardiovascular: RRR, S1/S2 +, no rubs, no gallops Respiratory: CTA bilaterally, no wheezing, no rhonchi Abdominal: Soft, tender in RLQ unchanged, drin in place without significant erythema, discharge. Otherwise benign, +BS. Extremities: No  edema, no cyanosis  Labs: BNP (last 3 results) No results for input(s): BNP in the last 8760 hours. Basic Metabolic Panel: Recent Labs  Lab 07/12/18 0146 07/14/18 0530 07/15/18 0541  NA 135 133* 134*  K 3.0* 3.2* 3.4*  CL 97* 98 100  CO2 28 32 29  GLUCOSE 108* 124* 134*  BUN 5* 6* 6*  CREATININE 0.85 0.84 0.77  CALCIUM 8.6* 8.4* 8.4*   Liver Function Tests: No results for input(s): AST, ALT, ALKPHOS, BILITOT, PROT, ALBUMIN in the last 168 hours. No results for  input(s): LIPASE, AMYLASE in the last 168 hours. No results for input(s): AMMONIA in the last 168 hours. CBC: Recent Labs  Lab 07/11/18 0239  WBC 9.2  HGB 10.9*  HCT 35.3*  MCV 91.5  PLT 208   Cardiac Enzymes: Recent Labs  Lab 07/09/18 2024  TROPONINI <0.03   BNP: Invalid input(s): POCBNP CBG: No results for input(s): GLUCAP in the last 168 hours. D-Dimer No results for input(s): DDIMER in the last 72 hours. Hgb A1c No results for input(s): HGBA1C in the last 72 hours. Lipid Profile No results for input(s): CHOL, HDL, LDLCALC, TRIG, CHOLHDL, LDLDIRECT in the last 72 hours. Thyroid function studies No results for input(s): TSH, T4TOTAL, T3FREE, THYROIDAB in the last 72 hours.  Invalid input(s): FREET3 Anemia work up No results for input(s): VITAMINB12, FOLATE, FERRITIN, TIBC, IRON, RETICCTPCT in the last 72 hours. Urinalysis    Component Value Date/Time   COLORURINE YELLOW 07/08/2018 2231   APPEARANCEUR CLEAR 07/08/2018 2231   LABSPEC 1.025 07/08/2018 2231   PHURINE 7.0 07/08/2018 2231   GLUCOSEU NEGATIVE 07/08/2018 2231   HGBUR SMALL (A) 07/08/2018 2231   BILIRUBINUR NEGATIVE 07/08/2018 2231   BILIRUBINUR neg 07/31/2013 1752   KETONESUR NEGATIVE 07/08/2018 2231   PROTEINUR NEGATIVE 07/08/2018 2231   UROBILINOGEN 0.2 07/31/2013 1752   NITRITE NEGATIVE 07/08/2018 2231   LEUKOCYTESUR NEGATIVE 07/08/2018 2231    Microbiology Recent Results (from the past 240 hour(s))  Urine culture     Status: None   Collection Time: 07/08/18 10:22 PM  Result Value Ref Range Status   Specimen Description URINE, RANDOM  Final   Special Requests NONE  Final   Culture   Final    NO GROWTH Performed at [facility], [place] [address] [place], [place] [address]    Report Status 07/10/2018 FINAL  Final  Aerobic/Anaerobic Culture (surgical/deep wound)     Status: None (Preliminary result)   Collection Time: 07/14/18  2:08 PM  Result Value Ref Range Status   Specimen  Description ABSCESS ABDOMEN  Final   Special Requests NONE  Final   Gram Stain   Final    ABUNDANT WBC PRESENT, PREDOMINANTLY MONONUCLEAR RARE GRAM POSITIVE RODS    Culture   Final    CULTURE REINCUBATED FOR BETTER GROWTH Performed at [facility], [place] [address] [place], [place] [address]    Report Status PENDING  Incomplete    Time coordinating discharge: Approximately 40 minutes  Patrecia Pour, MD  [facility] 07/16/2018, 11:46 AM Pager [phone]

## 2018-07-16 NOTE — Progress Notes (Addendum)
Referring Physician(s): Dr. Hassell Done  Supervising Physician: Corrie Mckusick  Patient Status:  [facility] - In-pt  Chief Complaint: Follow-up appendiceal abscess  Subjective:  Patient presented to [facility] on 10/28 with c/o RLQ abdominal pain with intermittent nausea, vomiting, fever and chills. CT abdomen/pelvis on 10/28 showed periappendiceal abscess containing gas and fluid measuring up to 3.4 cm - IR was consulted at that time and it was determined that there was no safe window to access abscess. Repeat CT abdomen/pelvis was performed on 11/2 which showed interval enlargement of previously known appendiceal abscess to 5 cm. IR was again consulted and abdominal abscess drain was placed on 11/3 by Dr. Earleen Newport without complication.  Patient sitting up in bed watching TV, talkative, looking forward to going home. Has many questions about drain. States appetite is good, denies nausea/vomiting, still having trouble with BMs - took some medicine today which she is hopeful will help. Reports some pain in abdomen still but improved overall.  Allergies: Patient has no known allergies.  Medications: Prior to Admission medications   Medication Sig Start Date End Date Taking? Authorizing Provider  amLODipine (NORVASC) 10 MG tablet Take 1 tablet (10 mg total) by mouth daily. 10/25/17  Yes Rai, Ripudeep K, MD  aspirin EC 325 MG EC tablet Take 1 tablet (325 mg total) by mouth daily. 10/25/17  Yes Rai, Ripudeep K, MD  bisacodyl (DULCOLAX) 5 MG EC tablet Take 10 mg by mouth at bedtime.    Yes [provider]  CASCARA SAGRADA PO Take 2 capsules by mouth at bedtime.   Yes [provider]  clonazePAM (KLONOPIN) 0.5 MG tablet Take 0.5 mg by mouth at bedtime.   Yes [provider]  DULoxetine (CYMBALTA) 30 MG capsule 30 mg at bedtime.   Yes [provider]  DULoxetine (CYMBALTA) 60 MG capsule Take 60 mg by mouth every morning.    Yes [provider]  famotidine (PEPCID) 20 MG  tablet One at bedtime 02/14/16  Yes Tanda Rockers, MD  lamoTRIgine (LAMICTAL) 100 MG tablet Take 100 mg by mouth daily.  12/18/17  Yes [provider]  linaclotide Rolan Lipa) 145 MCG CAPS capsule Take 1 capsule (145 mcg total) by mouth daily before breakfast. 11/28/17  Yes Armbruster, Carlota Raspberry, MD  losartan (COZAAR) 50 MG tablet Take 1 tablet (50 mg total) by mouth daily. 10/25/17  Yes Rai, Ripudeep K, MD  meclizine (ANTIVERT) 12.5 MG tablet Take 1 tablet (12.5 mg total) by mouth 3 (three) times daily. 05/23/18  Yes Kathrynn Ducking, MD  polyethylene glycol [facility] / Floria Raveling) packet Take 34 g by mouth every other day.    Yes [provider]  rOPINIRole (REQUIP) 2 MG tablet TAKE 1 TABLET BY MOUTH AT BEDTIME Patient taking differently: Take 2 mg by mouth at bedtime.  05/27/12  Yes Dunn, Areta Haber, PA-C  senna (SENOKOT) 8.6 MG TABS tablet Take 2 tablets by mouth at bedtime.    Yes [provider]  mupirocin ointment (BACTROBAN) 2 % Apply 1 application topically 2 (two) times daily. Patient not taking: Reported on 07/08/2018 05/30/18   [name], MD     Vital Signs: BP (!) 157/78   Pulse 68   Temp 98 F (36.7 C) (Oral)   Resp 20   Ht 5\' 7"  (1.702 m)   Wt 155 lb (70.3 kg)   SpO2 95%   BMI 24.28 kg/m   Physical Exam  Constitutional: She is oriented to person, place, and time.  No distress.  Cardiovascular: Normal rate, regular rhythm and normal heart sounds.  Pulmonary/Chest: Effort normal and breath sounds normal.  Abdominal: Soft. She exhibits no distension. There is tenderness (mild around drain insertion site).  RLQ drain insertion site clean, dry, dressed appropriately. JP to suction without about 15 cc purulent output - flushes easily, aspirates with some difficulty but improved after aggressive flushing/aspiration. No leakage of fluid noted with flushing.  Neurological: She is alert and oriented to person, place, and time.  Skin: Skin is warm and dry. She  is not diaphoretic.  Psychiatric: She has a normal mood and affect. Her behavior is normal. Judgment and thought content normal.    Imaging: Ct Abdomen Pelvis W Contrast  Result Date: 07/13/2018 CLINICAL DATA:  Worsening abdominal pain, fever and chills. Appendiceal abscess. EXAM: CT ABDOMEN AND PELVIS WITH CONTRAST TECHNIQUE: Multidetector CT imaging of the abdomen and pelvis was performed using the standard protocol following bolus administration of intravenous contrast. CONTRAST:  170mL OMNIPAQUE IOHEXOL 300 MG/ML  SOLN COMPARISON:  07/08/2018 FINDINGS: Lower chest: Calcific atherosclerotic disease of the aorta and coronary arteries. Mildly enlarged heart. Hepatobiliary: No focal liver abnormality is seen. No gallstones, gallbladder wall thickening, or biliary dilatation. Pancreas: Unremarkable. No pancreatic ductal dilatation or surrounding inflammatory changes. Spleen: Normal in size without focal abnormality. Adrenals/Urinary Tract: Adrenal glands are unremarkable. Kidneys are without renal calculi, focal lesion, or hydronephrosis. Benign-appearing left renal cyst. Other bilateral subcentimeter probable renal cysts, too small to be actually characterize by CT. Tenting of the right anterior surface of the urinary bladder, and mucosal thickening, adjacent to the appendiceal abscess is noted. Stomach/Bowel: The stomach is normal. No evidence of small-bowel obstruction. Normal colonic bowel pattern. The previously demonstrated appendiceal thick-walled gas and fluid collection in the right lower quadrant of the abdomen has enlarged and measures 5.0 x 4.8 x 4.3 Cm. Vascular/Lymphatic: No adenopathy. Infrarenal abdominal aortic aneurysm measures 3.1 cm. Calcific atherosclerotic disease of the aorta and tortuosity. Reproductive: Status post hysterectomy. No adnexal masses. Other: No free fluid or free gas within the abdomen outside of the loculated right lower quadrant abscess. Musculoskeletal: Spondylosis of  the lumbosacral spine. IMPRESSION: Interval enlargement of the known right appendiceal abscess, which now measures 5 cm in greatest dimension. Likely reactive tenting and mucosal thickening of the adjacent urinary bladder. No free fluid or free gas outside of the abscess collection. Infrarenal abdominal aortic aneurysm measures 3.1 cm. Recommend followup by ultrasound in 3 years. This recommendation follows ACR consensus guidelines: White Paper of the ACR Incidental Findings Committee II on Vascular Findings. Nat[employer] Coll Radiol 2013; 10:789-794 Electronically Signed   By: Fidela Salisbury M.D.   On: 07/13/2018 15:59   Ct Image Guided Drainage By Percutaneous Catheter  Result Date: 07/14/2018 INDICATION: 72 year old female with a history ruptured appendicitis and abscess EXAM: CT-GUIDED DRAINAGE OF LEFT LOWER QUADRANT ABSCESS MEDICATIONS: The patient is currently admitted to the hospital and receiving intravenous antibiotics. The antibiotics were administered within an appropriate time frame prior to the initiation of the procedure. ANESTHESIA/SEDATION: 1.5 mg IV Versed 150 mcg IV Fentanyl Moderate Sedation Time:  20 minutes The patient was continuously monitored during the procedure by the interventional radiology nurse under my direct supervision. COMPLICATIONS: None TECHNIQUE: Informed written consent was obtained from the patient after a thorough discussion of the procedural risks, benefits and alternatives. All questions were addressed. Maximal Sterile Barrier Technique was utilized including caps, mask, sterile gowns, sterile gloves, sterile drape, hand hygiene and skin antiseptic. A timeout  was performed prior to the initiation of the procedure. PROCEDURE: The operative field was prepped with chlorhexidine in a sterile fashion, and a sterile drape was applied covering the operative field. A sterile gown and sterile gloves were used for the procedure. Local anesthesia was provided with 1% Lidocaine. Once  the patient is prepped and draped, 1% lidocaine was used for local anesthesia. CT guidance was used to place a trocar needle into the abscess of the right lower quadrant. Modified Seldinger technique was used to place a 10 [place] drain. Approximately 15 cc of purulent material aspirated. Sample sent to lab.  Final image was stored. Patient tolerated the procedure well and remained hemodynamically stable throughout. No complications encountered and no significant blood loss. FINDINGS: Scout CT demonstrates fluid and gas collection of the right lower quadrant. Status post drainage there is decreased fluid and gas with drainage catheter formed in the abscess region. Atherosclerosis. Diverticular disease. IMPRESSION: Status post CT-guided drainage of right lower quadrant abscess. Signed, Dulcy Fanny. Dellia Nims, RPVI [facility] Radiology Electronically Signed   By: Corrie Mckusick D.O.   On: 07/14/2018 13:34    Labs:  CBC: Recent Labs    03/05/18 1758 04/30/18 1628 07/08/18 1344 07/09/18 0757 07/11/18 0239  WBC 7.4 7.9 11.5* 13.3* 9.2  HGB 13.6 13.1 12.6 12.0 10.9*  HCT 41.9 40.8 40.8 39.0 35.3*  PLT 241  --  252 213 208    COAGS: Recent Labs    10/23/17 1630 07/09/18 0757  INR 1.01 1.15  APTT 34  --     BMP: Recent Labs    07/09/18 0757 07/12/18 0146 07/14/18 0530 07/15/18 0541  NA 133* 135 133* 134*  K 3.6 3.0* 3.2* 3.4*  CL 98 97* 98 100  CO2 27 28 32 29  GLUCOSE 143* 108* 124* 134*  BUN 6* 5* 6* 6*  CALCIUM 8.4* 8.6* 8.4* 8.4*  CREATININE 1.02* 0.85 0.84 0.77  GFRNONAA 53* >60 >60 >60  GFRAA >60 >60 >60 >60    LIVER FUNCTION TESTS: Recent Labs    10/23/17 1630 03/05/18 1758 07/08/18 1344 07/09/18 0757  BILITOT 0.4 0.3 0.8 0.6  AST 21 16 15  12*  ALT 19 14 11 9   ALKPHOS 65 70 66 65  PROT 6.5 7.4 7.0 6.1*  ALBUMIN 3.5 4.4 3.6 3.0*    Assessment and Plan:  S/p intra-abdominal abscess drain placement in IR on 11/3  by Dr. Earleen Newport. 30 cc output recorded in last 24 hours, patient reports drain has not been flushed until my exam. Cultures of aspirate still pending - gram stain shows gram positive rods. She continues on Zosyn. Afebrile, no updated CBC since placement although WBC 9.2 on 10/31.   Patient will follow up with IR in out patient clinic in 10-14 days from placement for repeat CT scan and possible drain injection - IR scheduler will call patient with appointment date and time. At home she will continue to flush drain daily with 5 cc NS and record output daily on drain card. She will require instruction on drain care prior to discharge home.  Orders for outpatient IR follow up placed today, will provide drain card and prescription for NS flushes as well.  Please call with questions or concerns.   Electronically Signed: Joaquim Nam, PA-C 07/16/2018, 11:43 AM   I spent a total of 15 Minutes at the the patient's bedside AND on the patient's hospital floor or unit, greater than 50% of which was  counseling/coordinating care for abdominal abscess drain.

## 2018-07-16 NOTE — Progress Notes (Signed)
[facility] Progress Note     Subjective: CC-  Sitting up in chair. Overall doing well. Denies any current abdominal pain. Denies n/v. Tolerating diet. Small BM yesterday. VSS  Objective: Vital signs in last 24 hours: Temp:  [98 F (36.7 C)-98.7 F (37.1 C)] 98 F (36.7 C) (11/05 0510) Pulse Rate:  [67-77] 68 (11/05 0510) Resp:  [20] 20 (11/04 1726) BP: (157-159)/(69-78) 157/78 (11/05 0510) SpO2:  [87 %-97 %] 95 % (11/05 0520) Last BM Date: 07/15/18  Intake/Output from previous day: 11/04 0701 - 11/05 0700 In: 21 [P.O.:960; IV Piggyback:100] Out: 30 [Drains:30] Intake/Output this shift: No intake/output data recorded.  PE: Gen:  Alert, NAD, pleasant HEENT: EOM's intact, pupils equal and round Pulm:  effort normal Abd: Soft, ND, mild TTP around drain, +BS, drain with serous/purulent drainage Psych: A&Ox3  Skin: no rashes noted, warm and dry  Lab Results:  No results for input(s): WBC, HGB, HCT, PLT in the last 72 hours. BMET Recent Labs    07/14/18 0530 07/15/18 0541  NA 133* 134*  K 3.2* 3.4*  CL 98 100  CO2 32 29  GLUCOSE 124* 134*  BUN 6* 6*  CREATININE 0.84 0.77  CALCIUM 8.4* 8.4*   PT/INR No results for input(s): LABPROT, INR in the last 72 hours. CMP     Component Value Date/Time   NA 134 (L) 07/15/2018 0541   NA 139 03/05/2018 1758   K 3.4 (L) 07/15/2018 0541   CL 100 07/15/2018 0541   CO2 29 07/15/2018 0541   GLUCOSE 134 (H) 07/15/2018 0541   BUN 6 (L) 07/15/2018 0541   BUN 13 03/05/2018 1758   CREATININE 0.77 07/15/2018 0541   CREATININE 0.77 07/28/2015 1805   CALCIUM 8.4 (L) 07/15/2018 0541   PROT 6.1 (L) 07/09/2018 0757   PROT 7.4 03/05/2018 1758   ALBUMIN 3.0 (L) 07/09/2018 0757   ALBUMIN 4.4 03/05/2018 1758   AST 12 (L) 07/09/2018 0757   ALT 9 07/09/2018 0757   ALKPHOS 65 07/09/2018 0757   BILITOT 0.6 07/09/2018 0757   BILITOT 0.3 03/05/2018 1758   GFRNONAA >60 07/15/2018 0541   GFRAA >60 07/15/2018 0541    Lipase     Component Value Date/Time   LIPASE 22 07/08/2018 1344       Studies/Results: Ct Image Guided Drainage By Percutaneous Catheter  Result Date: 07/14/2018 INDICATION: 72 year old female with a history ruptured appendicitis and abscess EXAM: CT-GUIDED DRAINAGE OF LEFT LOWER QUADRANT ABSCESS MEDICATIONS: The patient is currently admitted to the hospital and receiving intravenous antibiotics. The antibiotics were administered within an appropriate time frame prior to the initiation of the procedure. ANESTHESIA/SEDATION: 1.5 mg IV Versed 150 mcg IV Fentanyl Moderate Sedation Time:  20 minutes The patient was continuously monitored during the procedure by the interventional radiology nurse under my direct supervision. COMPLICATIONS: None TECHNIQUE: Informed written consent was obtained from the patient after a thorough discussion of the procedural risks, benefits and alternatives. All questions were addressed. Maximal Sterile Barrier Technique was utilized including caps, mask, sterile gowns, sterile gloves, sterile drape, hand hygiene and skin antiseptic. A timeout was performed prior to the initiation of the procedure. PROCEDURE: The operative field was prepped with chlorhexidine in a sterile fashion, and a sterile drape was applied covering the operative field. A sterile gown and sterile gloves were used for the procedure. Local anesthesia was provided with 1% Lidocaine. Once the patient is prepped and draped, 1% lidocaine was used for local anesthesia. CT guidance was  used to place a trocar needle into the abscess of the right lower quadrant. Modified Seldinger technique was used to place a 10 [place] drain. Approximately 15 cc of purulent material aspirated. Sample sent to lab.  Final image was stored. Patient tolerated the procedure well and remained hemodynamically stable throughout. No complications encountered and no significant blood loss. FINDINGS: Scout CT demonstrates fluid and gas  collection of the right lower quadrant. Status post drainage there is decreased fluid and gas with drainage catheter formed in the abscess region. Atherosclerosis. Diverticular disease. IMPRESSION: Status post CT-guided drainage of right lower quadrant abscess. Signed, Dulcy Fanny. Dellia Nims, RPVI [facility] Radiology Electronically Signed   By: Corrie Mckusick D.O.   On: 07/14/2018 13:34    Anti-infectives: Anti-infectives (From admission, onward)   Start     Dose/Rate Route Frequency Ordered Stop   07/09/18 0700  piperacillin-tazobactam (ZOSYN) IVPB 3.375 g     3.375 g 12.5 mL/hr over 240 Minutes Intravenous Every 8 hours 07/09/18 0645     07/08/18 1715  piperacillin-tazobactam (ZOSYN) IVPB 3.375 g     3.375 g 12.5 mL/hr over 240 Minutes Intravenous  Once 07/08/18 1711 07/08/18 2319       Assessment/Plan IBS-C Chest pain - resolved HTN hypothyroidism Bipolar disorder PMH CVA  PAD Melena - will need further GI workup after acute appendicitis improves, hct/hct normal, CEA2.7.  Acute perforated appendicitis with abscess - repeat CT over weekend revealed enlargement of this collection and amenable to drainage  - s/p IR drain 11/4, cx showing gram + rods. Discussed with pharmacy, augmentin should provide good coverage. - afebrile, VSS,Leukocytosis resolved   FEN -reg diet ID - Zosyn 10.28 >> VTE - SCD's,Lovenox  Plan: Patient stable for discharge from surgical standpoint. Recommend 1 week of augmentin to complete 2 total weeks of antibiotics. She will need f/u with IR drain clinic. Will arrange f/u with Dr. Brantley Stage. Needs drain teaching/education prior to discharge.   LOS: 8 days    Wellington [place] , [facility] 07/16/2018, 9:54 AM Pager: [phone] Mon 7:00 am -11:30 AM Tues-Fri 7:00 am-4:30 pm Sat-Sun 7:00 am-11:30 am

## 2018-07-16 NOTE — Discharge Instructions (Signed)
Percutaneous Abscess Drain, Care After °This sheet gives you information about how to care for yourself after your procedure. Your health care provider may also give you more specific instructions. If you have problems or questions, contact your health care provider. °What can I expect after the procedure? °After your procedure, it is common to have: °· A small amount of bruising and discomfort in the area where the drainage tube (catheter) was placed. °· Sleepiness and fatigue. This should go away after the medicines you were given have worn off. ° °Follow these instructions at home: °Incision care °· Follow instructions from your health care provider about how to take care of your incision. Make sure you: °? Wash your hands with soap and water before you change your bandage (dressing). If soap and water are not available, use hand sanitizer. °? Change your dressing as told by your health care provider. °? Leave stitches (sutures), skin glue, or adhesive strips in place. These skin closures may need to stay in place for 2 weeks or longer. If adhesive strip edges start to loosen and curl up, you may trim the loose edges. Do not remove adhesive strips completely unless your health care provider tells you to do that. °· Check your incision area every day for signs of infection. Check for: °? More redness, swelling, or pain. °? More fluid or blood. °? Warmth. °? Pus or a bad smell. °? Fluid leaking from around your catheter (instead of fluid draining through your catheter). °Catheter care °· Follow instructions from your health care provider about emptying and cleaning your catheter and collection bag. You may need to clean the catheter every day so it does not clog. °· If directed, write down the following information every time you empty your bag: °? The date and time. °? The amount of drainage. °General instructions °· Rest at home for 1-2 days after your procedure. Return to your normal activities as told by your  health care provider. °· Do not take baths, swim, or use a hot tub for 24 hours after your procedure, or until your health care provider says that this is okay. °· Take over-the-counter and prescription medicines only as told by your health care provider. °· Keep all follow-up visits as told by your health care provider. This is important. °Contact a health care provider if: °· You have less than 10 mL of drainage a day for 2-3 days in a row, or as directed by your health care provider. °· You have more redness, swelling, or pain around your incision area. °· You have more fluid or blood coming from your incision area. °· Your incision area feels warm to the touch. °· You have pus or a bad smell coming from your incision area. °· You have fluid leaking from around your catheter (instead of through your catheter). °· You have a fever or chills. °· You have pain that does not get better with medicine. °Get help right away if: °· Your catheter comes out. °· You suddenly stop having drainage from your catheter. °· You suddenly have blood in the fluid that is draining from your catheter. °· You become dizzy or you faint. °· You develop a rash. °· You have nausea or vomiting. °· You have difficulty breathing or you feel short of breath. °· You develop chest pain. °· You have problems with your speech or vision. °· You have trouble balancing or moving your arms or legs. °Summary °· It is common to have a small   amount of bruising and discomfort in the area where the drainage tube (catheter) was placed. °· You may be directed to record the amount of drainage from the bag every time you empty it. °· Follow instructions from your health care provider about emptying and cleaning your catheter and collection bag. °This information is not intended to replace advice given to you by your health care provider. Make sure you discuss any questions you have with your health care provider. °Document Released: 01/12/2014 Document  Revised: 07/20/2016 Document Reviewed: 07/20/2016 °Elsevier Interactive Patient Education © 2017 [employer] ° °

## 2018-07-17 ENCOUNTER — Other Ambulatory Visit: Payer: Self-pay | Admitting: Surgery

## 2018-07-17 ENCOUNTER — Encounter: Payer: Self-pay | Admitting: Family Medicine

## 2018-07-17 ENCOUNTER — Telehealth: Payer: Self-pay | Admitting: Family Medicine

## 2018-07-17 ENCOUNTER — Other Ambulatory Visit (HOSPITAL_COMMUNITY): Payer: Self-pay | Admitting: Physician Assistant

## 2018-07-17 DIAGNOSIS — I10 Essential (primary) hypertension: Secondary | ICD-10-CM | POA: Diagnosis not present

## 2018-07-17 DIAGNOSIS — K3533 Acute appendicitis with perforation and localized peritonitis, with abscess: Secondary | ICD-10-CM

## 2018-07-17 NOTE — Telephone Encounter (Signed)
Copied from [place] [id number]. Topic: Quick Communication - Home Health Verbal Orders >> Jul 17, 2018  4:04 PM Sheran Luz wrote: Caller/Agency: [facility] Number: [phone] Requesting OT/PT/Skilled Nursing/Social Work: Nursing Frequency: 2w2 1w2

## 2018-07-18 ENCOUNTER — Telehealth: Payer: Self-pay | Admitting: Cardiovascular Disease

## 2018-07-18 DIAGNOSIS — I10 Essential (primary) hypertension: Secondary | ICD-10-CM | POA: Diagnosis not present

## 2018-07-18 DIAGNOSIS — K3533 Acute appendicitis with perforation and localized peritonitis, with abscess: Secondary | ICD-10-CM | POA: Diagnosis not present

## 2018-07-18 LAB — AEROBIC/ANAEROBIC CULTURE (SURGICAL/DEEP WOUND)

## 2018-07-18 LAB — AEROBIC/ANAEROBIC CULTURE W GRAM STAIN (SURGICAL/DEEP WOUND)

## 2018-07-18 NOTE — Telephone Encounter (Signed)
  LVM for patient to call back to make appt with Dr Gwenlyn Found or PA. Received notes from Dr Nyoka Cowden requesting we set up appt regarding dizzy spells

## 2018-07-18 NOTE — Telephone Encounter (Signed)
Verbal given 

## 2018-07-19 DIAGNOSIS — K3533 Acute appendicitis with perforation and localized peritonitis, with abscess: Secondary | ICD-10-CM | POA: Diagnosis not present

## 2018-07-19 DIAGNOSIS — I10 Essential (primary) hypertension: Secondary | ICD-10-CM | POA: Diagnosis not present

## 2018-07-20 DIAGNOSIS — I10 Essential (primary) hypertension: Secondary | ICD-10-CM | POA: Diagnosis not present

## 2018-07-20 DIAGNOSIS — K3533 Acute appendicitis with perforation and localized peritonitis, with abscess: Secondary | ICD-10-CM | POA: Diagnosis not present

## 2018-07-21 ENCOUNTER — Encounter: Payer: Self-pay | Admitting: Family Medicine

## 2018-07-22 DIAGNOSIS — K3533 Acute appendicitis with perforation and localized peritonitis, with abscess: Secondary | ICD-10-CM | POA: Diagnosis not present

## 2018-07-22 DIAGNOSIS — I10 Essential (primary) hypertension: Secondary | ICD-10-CM | POA: Diagnosis not present

## 2018-07-23 DIAGNOSIS — I10 Essential (primary) hypertension: Secondary | ICD-10-CM | POA: Diagnosis not present

## 2018-07-23 DIAGNOSIS — K3533 Acute appendicitis with perforation and localized peritonitis, with abscess: Secondary | ICD-10-CM | POA: Diagnosis not present

## 2018-07-24 ENCOUNTER — Encounter: Payer: Self-pay | Admitting: Family Medicine

## 2018-07-24 DIAGNOSIS — K3533 Acute appendicitis with perforation and localized peritonitis, with abscess: Secondary | ICD-10-CM | POA: Diagnosis not present

## 2018-07-24 DIAGNOSIS — I10 Essential (primary) hypertension: Secondary | ICD-10-CM | POA: Diagnosis not present

## 2018-07-25 DIAGNOSIS — K3533 Acute appendicitis with perforation and localized peritonitis, with abscess: Secondary | ICD-10-CM | POA: Diagnosis not present

## 2018-07-25 DIAGNOSIS — I10 Essential (primary) hypertension: Secondary | ICD-10-CM | POA: Diagnosis not present

## 2018-07-26 NOTE — Telephone Encounter (Signed)
Thanks.  Just saw that in referrals tab.  Sorry for extra message. -JG.

## 2018-07-26 NOTE — Telephone Encounter (Signed)
See patient request and prior notes. Let me know if there are questions.  thanks.

## 2018-07-30 ENCOUNTER — Ambulatory Visit
Admission: RE | Admit: 2018-07-30 | Discharge: 2018-07-30 | Disposition: A | Discharge status: Home / Self Care | Payer: Medicare Other | Source: Ambulatory Visit | Attending: Surgery | Admitting: Surgery

## 2018-07-30 ENCOUNTER — Inpatient Hospital Stay: Admission: RE | Admit: 2018-07-30 | Payer: Medicare Other | Source: Ambulatory Visit

## 2018-07-30 ENCOUNTER — Other Ambulatory Visit: Payer: Self-pay

## 2018-07-30 ENCOUNTER — Other Ambulatory Visit: Payer: Medicare Other

## 2018-07-30 ENCOUNTER — Ambulatory Visit
Admission: RE | Admit: 2018-07-30 | Discharge: 2018-07-30 | Disposition: A | Discharge status: Home / Self Care | Payer: Medicare Other | Source: Ambulatory Visit | Attending: Physician Assistant | Admitting: Physician Assistant

## 2018-07-30 ENCOUNTER — Encounter: Payer: Self-pay | Admitting: Radiology

## 2018-07-30 DIAGNOSIS — I714 Abdominal aortic aneurysm, without rupture: Secondary | ICD-10-CM | POA: Diagnosis not present

## 2018-07-30 DIAGNOSIS — K651 Peritoneal abscess: Secondary | ICD-10-CM | POA: Diagnosis not present

## 2018-07-30 DIAGNOSIS — K3533 Acute appendicitis with perforation and localized peritonitis, with abscess: Secondary | ICD-10-CM

## 2018-07-30 DIAGNOSIS — K3521 Acute appendicitis with generalized peritonitis, with abscess: Secondary | ICD-10-CM | POA: Diagnosis not present

## 2018-07-30 DIAGNOSIS — Z4803 Encounter for change or removal of drains: Secondary | ICD-10-CM | POA: Diagnosis not present

## 2018-07-30 HISTORY — PX: IR RADIOLOGIST EVAL & MGMT: IMG5224

## 2018-07-30 MED ORDER — IOPAMIDOL (ISOVUE-300) INJECTION 61%
100.0000 mL | Freq: Once | INTRAVENOUS | Status: AC | PRN
  Administered 2018-07-30: 100 mL via INTRAVENOUS

## 2018-07-30 MED ORDER — CLONAZEPAM 0.5 MG PO TABS: ORAL_TABLET | ORAL | 0 refills | Status: DC

## 2018-07-30 NOTE — Progress Notes (Signed)
Chief Complaint: Patient was seen in consultation today for RLQ abscess drain at the request of Dr. Erroll Luna  Referring Physician(s): Dr. Erroll Luna  History of Present Illness: [name] is a 72 y.o. female with hx of appendicitis with associated abscess. She underwent perc drain placement on 11/3. She has done well since then. She reports minimal pain, no fevers, chills, N/V. She is flushing daily, but notices some of flush leak at her skin site. She reports barely 5 mL or less fluid output per day. She returns today for CT scan and drain injection.  Past Medical History:  Diagnosis Date  . Bipolar disorder ([place])   . CAD (coronary artery disease)    intra and extracranial vascular dx per MRI 4/11, neurology rec strict CVRF control  . Colonic inertia   . Constipation    chronic;severe  . Depression   . Duodenitis   . EKG abnormalities    changes, stress test neg (false EKG changes)  . Gastritis   . GERD (gastroesophageal reflux disease)   . Hypertension   . Hypothyroid 01/16/2014  . Meningioma ([place]) 10/21/2013  . Psoriasis    sees derm  . Stroke [facility])     Past Surgical History:  Procedure Laterality Date  . arthroscopy  04/2010   Right knee  . TUBAL LIGATION      Allergies: Patient has no known allergies.  Medications: Prior to Admission medications   Medication Sig Start Date End Date Taking? Authorizing Provider  amLODipine (NORVASC) 10 MG tablet Take 1 tablet (10 mg total) by mouth daily. 10/25/17   Rai, Vernelle Emerald, MD  aspirin EC 325 MG EC tablet Take 1 tablet (325 mg total) by mouth daily. 10/25/17   Rai, Ripudeep K, MD  bisacodyl (DULCOLAX) 5 MG EC tablet Take 10 mg by mouth at bedtime.     [provider]  CASCARA SAGRADA PO Take 2 capsules by mouth at bedtime.    [provider]  clonazePAM (KLONOPIN) 0.5 MG tablet Take 0.5 mg by mouth at bedtime.    [provider]  DULoxetine (CYMBALTA) 30 MG capsule 30 mg at  bedtime.    [provider]  DULoxetine (CYMBALTA) 60 MG capsule Take 60 mg by mouth every morning.     [provider]  famotidine (PEPCID) 20 MG tablet One at bedtime 02/14/16   Tanda Rockers, MD  lamoTRIgine (LAMICTAL) 100 MG tablet Take 100 mg by mouth daily.  12/18/17   [provider]  linaclotide Rolan Lipa) 145 MCG CAPS capsule Take 1 capsule (145 mcg total) by mouth daily before breakfast. 11/28/17   Armbruster, Carlota Raspberry, MD  losartan (COZAAR) 50 MG tablet Take 1 tablet (50 mg total) by mouth daily. 10/25/17   Rai, Vernelle Emerald, MD  meclizine (ANTIVERT) 12.5 MG tablet Take 1 tablet (12.5 mg total) by mouth 3 (three) times daily. 05/23/18   Kathrynn Ducking, MD  mupirocin ointment (BACTROBAN) 2 % Apply 1 application topically 2 (two) times daily. Patient not taking: Reported on 07/08/2018 05/30/18   [name], MD  polyethylene glycol [facility] / Floria Raveling) packet Take 34 g by mouth every other day.     [provider]  rOPINIRole (REQUIP) 2 MG tablet TAKE 1 TABLET BY MOUTH AT BEDTIME Patient taking differently: Take 2 mg by mouth at bedtime.  05/27/12   Dunn, Areta Haber, PA-C  senna (SENOKOT) 8.6 MG TABS tablet Take 2 tablets by mouth at bedtime.  [provider]     Family History  Problem Relation Age of Onset  . Breast cancer Mother        metastisis to bones  . Diabetes Son   . Heart disease Unknown        grandfather   . Alcohol abuse Brother   . Heart disease Brother   . Heart disease Maternal Aunt   . Lung cancer Brother        smoked  . Colon cancer Neg Hx     Social History   Socioeconomic History  . Marital status: Married    Spouse name: Not on file  . Number of children: 1  . Years of education: Not on file  . Highest education level: Not on file  Occupational History  . Occupation: retired  Scientific laboratory technician  . Financial resource strain: Not on file  . Food insecurity:    Worry: Not on file    Inability: Not on file    . Transportation needs:    Medical: Not on file    Non-medical: Not on file  Tobacco Use  . Smoking status: Current Every Day Smoker    Packs/day: 1.00    Years: 60.00    Pack years: 60.00    Types: Cigarettes  . Smokeless tobacco: Never Used  Substance and Sexual Activity  . Alcohol use: Yes    Alcohol/week: 0.0 standard drinks    Comment: yes on occassion  . Drug use: No  . Sexual activity: Not on file  Lifestyle  . Physical activity:    Days per week: Not on file    Minutes per session: Not on file  . Stress: Not on file  Relationships  . Social connections:    Talks on phone: Not on file    Gets together: Not on file    Attends religious service: Not on file    Active member of club or organization: Not on file    Attends meetings of clubs or organizations: Not on file    Relationship status: Not on file  Other Topics Concern  . Not on file  Social History Narrative   Brother in law Mr Rexford Maus (one of my patients)   Lives w/ husband         Review of Systems: A 12 point ROS discussed and pertinent positives are indicated in the HPI above.  All other systems are negative.  Review of Systems  Vital Signs: There were no vitals taken for this visit.  Physical Exam  Constitutional: She is oriented to person, place, and time. She appears well-developed. No distress.  HENT:  Head: Normocephalic.  Mouth/Throat: Oropharynx is clear and moist.  Neck: Normal range of motion.  Cardiovascular: Normal rate, regular rhythm and normal heart sounds.  Pulmonary/Chest: Effort normal and breath sounds normal. No respiratory distress.  Abdominal: Soft.  RLQ drain intact, site clean. Scant serous output in bulb.  Neurological: She is alert and oriented to person, place, and time.     Imaging: Dg Chest 2 View  Result Date: 07/08/2018 CLINICAL DATA:  Cough for a year. History of chronic interstitial lung disease bronchitis. EXAM: CHEST - 2 VIEW COMPARISON:  Chest  radiograph October 24, 2017 and CT chest Jan 20, 2016. FINDINGS: Similar chronic interstitial changes confluent in lung bases. No pleural effusion or focal consolidation. Cardiomediastinal silhouette is normal. Calcified aortic arch. No pneumothorax. Soft tissue planes and included osseous structures are non suspicious. Moderate degenerative change of the  thoracic spine. IMPRESSION: Chronic interstitial changes without focal consolidation. Aortic Atherosclerosis (ICD10-I70.0). Electronically Signed   By: Elon Alas M.D.   On: 07/08/2018 15:03   Ct Abdomen Pelvis W Contrast  Result Date: 07/13/2018 CLINICAL DATA:  Worsening abdominal pain, fever and chills. Appendiceal abscess. EXAM: CT ABDOMEN AND PELVIS WITH CONTRAST TECHNIQUE: Multidetector CT imaging of the abdomen and pelvis was performed using the standard protocol following bolus administration of intravenous contrast. CONTRAST:  182mL OMNIPAQUE IOHEXOL 300 MG/ML  SOLN COMPARISON:  07/08/2018 FINDINGS: Lower chest: Calcific atherosclerotic disease of the aorta and coronary arteries. Mildly enlarged heart. Hepatobiliary: No focal liver abnormality is seen. No gallstones, gallbladder wall thickening, or biliary dilatation. Pancreas: Unremarkable. No pancreatic ductal dilatation or surrounding inflammatory changes. Spleen: Normal in size without focal abnormality. Adrenals/Urinary Tract: Adrenal glands are unremarkable. Kidneys are without renal calculi, focal lesion, or hydronephrosis. Benign-appearing left renal cyst. Other bilateral subcentimeter probable renal cysts, too small to be actually characterize by CT. Tenting of the right anterior surface of the urinary bladder, and mucosal thickening, adjacent to the appendiceal abscess is noted. Stomach/Bowel: The stomach is normal. No evidence of small-bowel obstruction. Normal colonic bowel pattern. The previously demonstrated appendiceal thick-walled gas and fluid collection in the right lower  quadrant of the abdomen has enlarged and measures 5.0 x 4.8 x 4.3 Cm. Vascular/Lymphatic: No adenopathy. Infrarenal abdominal aortic aneurysm measures 3.1 cm. Calcific atherosclerotic disease of the aorta and tortuosity. Reproductive: Status post hysterectomy. No adnexal masses. Other: No free fluid or free gas within the abdomen outside of the loculated right lower quadrant abscess. Musculoskeletal: Spondylosis of the lumbosacral spine. IMPRESSION: Interval enlargement of the known right appendiceal abscess, which now measures 5 cm in greatest dimension. Likely reactive tenting and mucosal thickening of the adjacent urinary bladder. No free fluid or free gas outside of the abscess collection. Infrarenal abdominal aortic aneurysm measures 3.1 cm. Recommend followup by ultrasound in 3 years. This recommendation follows ACR consensus guidelines: White Paper of the ACR Incidental Findings Committee II on Vascular Findings. Natasha Mead Coll Radiol 2013; 10:789-794 Electronically Signed   By: Fidela Salisbury M.D.   On: 07/13/2018 15:59   Ct Abdomen Pelvis W Contrast  Result Date: 07/08/2018 CLINICAL DATA:  Progressive right lower quadrant abdominal pain with fever and chills. EXAM: CT ABDOMEN AND PELVIS WITH CONTRAST TECHNIQUE: Multidetector CT imaging of the abdomen and pelvis was performed using the standard protocol following bolus administration of intravenous contrast. CONTRAST:  19mL OMNIPAQUE IOHEXOL 300 MG/ML SOLN, 39mL OMNIPAQUE IOHEXOL 300 MG/ML SOLN COMPARISON:  Multiple exams, including 11/05/2009 FINDINGS: Lower chest: Mild peripheral interstitial accentuation in the lung bases favoring the lower lobes, new compared to 11/05/2009 and mildly progressive compared to 01/20/2016, with subpleural reticulation suggesting UIP. Descending thoracic aortic atherosclerotic calcification. Right coronary artery atherosclerotic calcification. Hepatobiliary: Unremarkable Pancreas: Unremarkable Spleen: Unremarkable  Adrenals/Urinary Tract: Left kidney lower pole benign cyst. Hypodense lesions in the right kidney are technically too small to characterize although statistically likely to be cysts. Stomach/Bowel: A tubular structure below the cecum separate from the terminal ileum and likely representing inflamed appendix measures up to 1.5 cm in diameter. There is an adjacent collection of gas and fluid measuring 3.4 by 1.9 by 2.6 cm compatible with periappendiceal abscess. The extraluminal gas appears contained and I do not see any other extraluminal gas or truly free air. Vascular/Lymphatic: Infrarenal abdominal aortic aneurysm 3.1 cm transverse on image 83/6. Aortoiliac atherosclerotic vascular disease. Reproductive: Unremarkable Other: No supplemental non-categorized findings. Musculoskeletal:  Lumbar scoliosis, spondylosis, and degenerative disc disease. IMPRESSION: 1. Acute appendicitis with periappendiceal stranding and a periappendiceal abscess containing gas and fluid measuring up to 3.4 cm in diameter, favoring early rupture. Aside from this abscess there is no other extraluminal gas. 2. 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 3. Infrarenal abdominal aortic aneurysm 3.1 cm in diameter. Recommend followup by [place] in 3 years. This recommendation follows ACR consensus guidelines: White Paper of the ACR Incidental Findings Committee II on Vascular Findings. J Am Coll Radiol 2013; 63:016-010 4. Other imaging findings of potential clinical significance: Aortic Atherosclerosis (ICD10-I70.0). Lumbar scoliosis, spondylosis, and degenerative disc disease. Right coronary artery atherosclerosis. Electronically Signed   By: Van Clines M.D.   On: 07/08/2018 16:54   Ct Image Guided Drainage By Percutaneous Catheter  Result Date: 07/14/2018 INDICATION: 72 year old female with a history ruptured appendicitis and abscess EXAM: CT-GUIDED DRAINAGE OF LEFT LOWER QUADRANT ABSCESS MEDICATIONS: The patient  is currently admitted to the hospital and receiving intravenous antibiotics. The antibiotics were administered within an appropriate time frame prior to the initiation of the procedure. ANESTHESIA/SEDATION: 1.5 mg IV Versed 150 mcg IV Fentanyl Moderate Sedation Time:  20 minutes The patient was continuously monitored during the procedure by the interventional radiology nurse under my direct supervision. COMPLICATIONS: None TECHNIQUE: Informed written consent was obtained from the patient after a thorough discussion of the procedural risks, benefits and alternatives. All questions were addressed. Maximal Sterile Barrier Technique was utilized including caps, mask, sterile gowns, sterile gloves, sterile drape, hand hygiene and skin antiseptic. A timeout was performed prior to the initiation of the procedure. PROCEDURE: The operative field was prepped with chlorhexidine in a sterile fashion, and a sterile drape was applied covering the operative field. A sterile gown and sterile gloves were used for the procedure. Local anesthesia was provided with 1% Lidocaine. Once the patient is prepped and draped, 1% lidocaine was used for local anesthesia. CT guidance was used to place a trocar needle into the abscess of the right lower quadrant. Modified Seldinger technique was used to place a 10 [place] drain. Approximately 15 cc of purulent material aspirated. Sample sent to lab.  Final image was stored. Patient tolerated the procedure well and remained hemodynamically stable throughout. No complications encountered and no significant blood loss. FINDINGS: Scout CT demonstrates fluid and gas collection of the right lower quadrant. Status post drainage there is decreased fluid and gas with drainage catheter formed in the abscess region. Atherosclerosis. Diverticular disease. IMPRESSION: Status post CT-guided drainage of right lower quadrant abscess. Signed, Dulcy Fanny. Dellia Nims, RPVI Vascular and Interventional Radiology  [facility] Electronically Signed   By: Corrie Mckusick D.O.   On: 07/14/2018 13:34    Labs:  CBC: Recent Labs    03/05/18 1758 04/30/18 1628 07/08/18 1344 07/09/18 0757 07/11/18 0239  WBC 7.4 7.9 11.5* 13.3* 9.2  HGB 13.6 13.1 12.6 12.0 10.9*  HCT 41.9 40.8 40.8 39.0 35.3*  PLT 241  --  252 213 208    COAGS: Recent Labs    10/23/17 1630 07/09/18 0757  INR 1.01 1.15  APTT 34  --     BMP: Recent Labs    07/09/18 0757 07/12/18 0146 07/14/18 0530 07/15/18 0541  NA 133* 135 133* 134*  K 3.6 3.0* 3.2* 3.4*  CL 98 97* 98 100  CO2 27 28 32 29  GLUCOSE 143* 108* 124* 134*  BUN 6* 5* 6* 6*  CALCIUM 8.4* 8.6* 8.4* 8.4*  CREATININE  1.02* 0.85 0.84 0.77  GFRNONAA 53* >60 >60 >60  GFRAA >60 >60 >60 >60    LIVER FUNCTION TESTS: Recent Labs    10/23/17 1630 03/05/18 1758 07/08/18 1344 07/09/18 0757  BILITOT 0.4 0.3 0.8 0.6  AST 21 16 15  12*  ALT 19 14 11 9   ALKPHOS 65 70 66 65  PROT 6.5 7.4 7.0 6.1*  ALBUMIN 3.5 4.4 3.6 3.0*    TUMOR MARKERS: No results for input(s): AFPTM, CEA, CA199, CHROMGRNA in the last 8760 hours.  Assessment and Plan: RLQ abscess secondary to appendicitis. CT today shows marked interval improvement with essential resolution of the abscess. Drain injection performed shows no evidence of fistulous communication. It was felt appropriate to remove drain and this was done without difficulty. Dry dressing applied. Pt encouraged to keep follow up with her surgeon as planned.  Thank you for this interesting consult.  I greatly enjoyed meeting [name] and look forward to participating in their care.  A copy of this report was sent to the requesting provider on this date.  Electronically Signed: Ascencion Dike 07/30/2018, 2:56 PM   I spent a total of 20 minutes in face to face in clinical consultation, greater than 50% of which was counseling/coordinating care for RLQ abscess

## 2018-08-01 DIAGNOSIS — K3533 Acute appendicitis with perforation and localized peritonitis, with abscess: Secondary | ICD-10-CM | POA: Diagnosis not present

## 2018-08-01 DIAGNOSIS — I10 Essential (primary) hypertension: Secondary | ICD-10-CM | POA: Diagnosis not present

## 2018-08-05 DIAGNOSIS — Z8719 Personal history of other diseases of the digestive system: Secondary | ICD-10-CM | POA: Diagnosis not present

## 2018-08-07 ENCOUNTER — Encounter: Payer: Self-pay | Admitting: Emergency Medicine

## 2018-08-07 DIAGNOSIS — F329 Major depressive disorder, single episode, unspecified: Secondary | ICD-10-CM | POA: Insufficient documentation

## 2018-08-07 DIAGNOSIS — F411 Generalized anxiety disorder: Secondary | ICD-10-CM

## 2018-08-15 ENCOUNTER — Other Ambulatory Visit: Payer: Self-pay

## 2018-08-15 ENCOUNTER — Ambulatory Visit (HOSPITAL_COMMUNITY)
Admission: RE | Admit: 2018-08-15 | Discharge: 2018-08-15 | Disposition: A | Discharge status: Home / Self Care | Payer: Medicare Other | Source: Ambulatory Visit | Attending: Surgery | Admitting: Surgery

## 2018-08-15 ENCOUNTER — Other Ambulatory Visit: Payer: Self-pay | Admitting: *Deleted

## 2018-08-15 ENCOUNTER — Ambulatory Visit (INDEPENDENT_AMBULATORY_CARE_PROVIDER_SITE_OTHER): Payer: Medicare Other | Admitting: Vascular Surgery

## 2018-08-15 ENCOUNTER — Encounter: Payer: Self-pay | Admitting: Vascular Surgery

## 2018-08-15 ENCOUNTER — Encounter: Payer: Self-pay | Admitting: *Deleted

## 2018-08-15 VITALS — BP 146/85 | HR 68 | Temp 97.4°F | Resp 18 | Ht 67.0 in | Wt 155.0 lb

## 2018-08-15 DIAGNOSIS — I70212 Atherosclerosis of native arteries of extremities with intermittent claudication, left leg: Secondary | ICD-10-CM | POA: Diagnosis not present

## 2018-08-15 DIAGNOSIS — I739 Peripheral vascular disease, unspecified: Secondary | ICD-10-CM | POA: Diagnosis not present

## 2018-08-15 DIAGNOSIS — I639 Cerebral infarction, unspecified: Secondary | ICD-10-CM

## 2018-08-15 NOTE — H&P (View-Only) (Signed)
Patient is a 72 year old female who returns for follow-up today.  She was last seen March 15, 2018.  At that time she had very short distance claudication in her left leg.  She was offered an arteriogram but declined at that point and opted for a walking program.  She states she is still having difficulty with her left leg.  She experiences claudication symptoms at about 1/2-1 block.  She also has some early symptoms of rest pain in the left foot.  However she does have an ulcer on her pretibial region which she states is been present for several months.  She recently had a ruptured appendix this was treated with drainage and her drain is out at this point.  She is on aspirin.  States she did recently quit smoking.  Other chronic medical problems include bipolar disorder, coronary artery disease, hypertension all of which are currently stable.  Past Medical History:  Diagnosis Date  . Bipolar disorder ([place])   . CAD (coronary artery disease)    intra and extracranial vascular dx per MRI 4/11, neurology rec strict CVRF control  . Colonic inertia   . Constipation    chronic;severe  . Depression   . Duodenitis   . EKG abnormalities    changes, stress test neg (false EKG changes)  . Gastritis   . GERD (gastroesophageal reflux disease)   . Hypertension   . Hypothyroid 01/16/2014  . Meningioma ([place]) 10/21/2013  . Psoriasis    sees derm  . Stroke [facility])      Social History   Socioeconomic History  . Marital status: Married    Spouse name: Not on file  . Number of children: 1  . Years of education: Not on file  . Highest education level: Not on file  Occupational History  . Occupation: retired  Scientific laboratory technician  . Financial resource strain: Not on file  . Food insecurity:    Worry: Not on file    Inability: Not on file  . Transportation needs:    Medical: Not on file    Non-medical: Not on file  Tobacco Use  . Smoking status: Current Every Day Smoker    Packs/day: 1.00    Years: 60.00   Pack years: 60.00    Types: Cigarettes  . Smokeless tobacco: Never Used  Substance and Sexual Activity  . Alcohol use: Yes    Alcohol/week: 0.0 standard drinks    Comment: yes on occassion  . Drug use: No  . Sexual activity: Not on file  Lifestyle  . Physical activity:    Days per week: Not on file    Minutes per session: Not on file  . Stress: Not on file  Relationships  . Social connections:    Talks on phone: Not on file    Gets together: Not on file    Attends religious service: Not on file    Active member of club or organization: Not on file    Attends meetings of clubs or organizations: Not on file    Relationship status: Not on file  . Intimate partner violence:    Fear of current or ex partner: Not on file    Emotionally abused: Not on file    Physically abused: Not on file    Forced sexual activity: Not on file  Other Topics Concern  . Not on file  Social History Narrative   Brother in law Mr Rexford Maus (one of my patients)   Lives w/ husband  Current Outpatient Medications on File Prior to Visit  Medication Sig Dispense Refill  . amLODipine (NORVASC) 10 MG tablet Take 1 tablet (10 mg total) by mouth daily. 30 tablet 4  . aspirin EC 325 MG EC tablet Take 1 tablet (325 mg total) by mouth daily. 30 tablet 4  . bisacodyl (DULCOLAX) 5 MG EC tablet Take 10 mg by mouth at bedtime.     . CASCARA SAGRADA PO Take 2 capsules by mouth at bedtime.    . clonazePAM (KLONOPIN) 0.5 MG tablet Take 0.5mg  tab every bedtime and 1 tab PRN for anxiety 40 tablet 0  . DULoxetine (CYMBALTA) 30 MG capsule 30 mg at bedtime.    . DULoxetine (CYMBALTA) 60 MG capsule Take 60 mg by mouth every morning.     . famotidine (PEPCID) 20 MG tablet One at bedtime 30 tablet 11  . lamoTRIgine (LAMICTAL) 150 MG tablet Take 150 mg by mouth 2 (two) times daily.    Marland Kitchen linaclotide (LINZESS) 145 MCG CAPS capsule Take 1 capsule (145 mcg total) by mouth daily before breakfast. 30 capsule 3  .  mupirocin ointment (BACTROBAN) 2 % Apply 1 application topically 2 (two) times daily. 22 g 0  . polyethylene glycol (MIRALAX / GLYCOLAX) packet Take 34 g by mouth every other day.     Marland Kitchen rOPINIRole (REQUIP) 2 MG tablet TAKE 1 TABLET BY MOUTH AT BEDTIME (Patient taking differently: Take 2 mg by mouth at bedtime. ) 30 tablet 0  . lamoTRIgine (LAMICTAL) 100 MG tablet Take 100 mg by mouth daily.   0  . lithium carbonate 150 MG capsule Take 150 mg by mouth every morning.    Marland Kitchen losartan (COZAAR) 50 MG tablet Take 1 tablet (50 mg total) by mouth daily. (Patient not taking: Reported on 08/15/2018) 30 tablet 4  . meclizine (ANTIVERT) 12.5 MG tablet Take 1 tablet (12.5 mg total) by mouth 3 (three) times daily. (Patient not taking: Reported on 08/15/2018) 90 tablet 2  . senna (SENOKOT) 8.6 MG TABS tablet Take 2 tablets by mouth at bedtime.      No current facility-administered medications on file prior to visit.    Review of systems: She has shortness of breath with exertion.  She denies chest pain.  She denies any fever or chills at this point.  Physical exam:  Vitals:   08/15/18 1443  BP: (!) 146/85  Pulse: 68  Resp: 18  Temp: (!) 97.4 F (36.3 C)  TempSrc: Oral  SpO2: 97%  Weight: 155 lb (70.3 kg)  Height: 5\' 7"  (1.702 m)    Neck: No carotid bruits  Chest: Clear to auscultation bilaterally  Cardiac: Regular rate and rhythm  Abdomen: Soft nontender  Extremities: 2+ femoral pulses bilaterally absent popliteal and pedal pulses bilaterally  Skin: Dependent rubor elevation pallor lower left foot 4 mm shallow pretibial ulcer left leg  Data:: Patient had bilateral ABIs performed today.  I reviewed and interpreted the study.  Left side was 0.31 right side was 0.97  Assessment: Patient with very short distance claudication early rest pain and a fairly ischemic looking left foot at this point.  Plan: Patient is scheduled for aortogram lower extremity runoff possible intervention August 30, 2018.  Risk benefits possible complications procedure details including but not limited to bleeding infection contrast reaction vessel injury were explained the patient today.  She understands and agrees to proceed.  She also understands that if the she has extensive atherosclerosis she may not be a candidate for  percutaneous intervention and may require bypass that would be planned out for the future.  Ruta Hinds, MD [facility] [place] Office: [phone] Pager: [phone]

## 2018-08-15 NOTE — Progress Notes (Signed)
Patient is a 72 year old female who returns for follow-up today.  She was last seen March 15, 2018.  At that time she had very short distance claudication in her left leg.  She was offered an arteriogram but declined at that point and opted for a walking program.  She states she is still having difficulty with her left leg.  She experiences claudication symptoms at about 1/2-1 block.  She also has some early symptoms of rest pain in the left foot.  However she does have an ulcer on her pretibial region which she states is been present for several months.  She recently had a ruptured appendix this was treated with drainage and her drain is out at this point.  She is on aspirin.  States she did recently quit smoking.  Other chronic medical problems include bipolar disorder, coronary artery disease, hypertension all of which are currently stable.  Past Medical History:  Diagnosis Date  . Bipolar disorder ([place])   . CAD (coronary artery disease)    intra and extracranial vascular dx per MRI 4/11, neurology rec strict CVRF control  . Colonic inertia   . Constipation    chronic;severe  . Depression   . Duodenitis   . EKG abnormalities    changes, stress test neg (false EKG changes)  . Gastritis   . GERD (gastroesophageal reflux disease)   . Hypertension   . Hypothyroid 01/16/2014  . Meningioma ([place]) 10/21/2013  . Psoriasis    sees derm  . Stroke [facility])      Social History   Socioeconomic History  . Marital status: Married    Spouse name: Not on file  . Number of children: 1  . Years of education: Not on file  . Highest education level: Not on file  Occupational History  . Occupation: retired  Scientific laboratory technician  . Financial resource strain: Not on file  . Food insecurity:    Worry: Not on file    Inability: Not on file  . Transportation needs:    Medical: Not on file    Non-medical: Not on file  Tobacco Use  . Smoking status: Current Every Day Smoker    Packs/day: 1.00    Years: 60.00   Pack years: 60.00    Types: Cigarettes  . Smokeless tobacco: Never Used  Substance and Sexual Activity  . Alcohol use: Yes    Alcohol/week: 0.0 standard drinks    Comment: yes on occassion  . Drug use: No  . Sexual activity: Not on file  Lifestyle  . Physical activity:    Days per week: Not on file    Minutes per session: Not on file  . Stress: Not on file  Relationships  . Social connections:    Talks on phone: Not on file    Gets together: Not on file    Attends religious service: Not on file    Active member of club or organization: Not on file    Attends meetings of clubs or organizations: Not on file    Relationship status: Not on file  . Intimate partner violence:    Fear of current or ex partner: Not on file    Emotionally abused: Not on file    Physically abused: Not on file    Forced sexual activity: Not on file  Other Topics Concern  . Not on file  Social History Narrative   Brother in law Mr Rexford Maus (one of my patients)   Lives w/ husband  Current Outpatient Medications on File Prior to Visit  Medication Sig Dispense Refill  . amLODipine (NORVASC) 10 MG tablet Take 1 tablet (10 mg total) by mouth daily. 30 tablet 4  . aspirin EC 325 MG EC tablet Take 1 tablet (325 mg total) by mouth daily. 30 tablet 4  . bisacodyl (DULCOLAX) 5 MG EC tablet Take 10 mg by mouth at bedtime.     . CASCARA SAGRADA PO Take 2 capsules by mouth at bedtime.    . clonazePAM (KLONOPIN) 0.5 MG tablet Take 0.5mg  tab every bedtime and 1 tab PRN for anxiety 40 tablet 0  . DULoxetine (CYMBALTA) 30 MG capsule 30 mg at bedtime.    . DULoxetine (CYMBALTA) 60 MG capsule Take 60 mg by mouth every morning.     . famotidine (PEPCID) 20 MG tablet One at bedtime 30 tablet 11  . lamoTRIgine (LAMICTAL) 150 MG tablet Take 150 mg by mouth 2 (two) times daily.    Marland Kitchen linaclotide (LINZESS) 145 MCG CAPS capsule Take 1 capsule (145 mcg total) by mouth daily before breakfast. 30 capsule 3  .  mupirocin ointment (BACTROBAN) 2 % Apply 1 application topically 2 (two) times daily. 22 g 0  . polyethylene glycol (MIRALAX / GLYCOLAX) packet Take 34 g by mouth every other day.     Marland Kitchen rOPINIRole (REQUIP) 2 MG tablet TAKE 1 TABLET BY MOUTH AT BEDTIME (Patient taking differently: Take 2 mg by mouth at bedtime. ) 30 tablet 0  . lamoTRIgine (LAMICTAL) 100 MG tablet Take 100 mg by mouth daily.   0  . lithium carbonate 150 MG capsule Take 150 mg by mouth every morning.    Marland Kitchen losartan (COZAAR) 50 MG tablet Take 1 tablet (50 mg total) by mouth daily. (Patient not taking: Reported on 08/15/2018) 30 tablet 4  . meclizine (ANTIVERT) 12.5 MG tablet Take 1 tablet (12.5 mg total) by mouth 3 (three) times daily. (Patient not taking: Reported on 08/15/2018) 90 tablet 2  . senna (SENOKOT) 8.6 MG TABS tablet Take 2 tablets by mouth at bedtime.      No current facility-administered medications on file prior to visit.    Review of systems: She has shortness of breath with exertion.  She denies chest pain.  She denies any fever or chills at this point.  Physical exam:  Vitals:   08/15/18 1443  BP: (!) 146/85  Pulse: 68  Resp: 18  Temp: (!) 97.4 F (36.3 C)  TempSrc: Oral  SpO2: 97%  Weight: 155 lb (70.3 kg)  Height: 5\' 7"  (1.702 m)    Neck: No carotid bruits  Chest: Clear to auscultation bilaterally  Cardiac: Regular rate and rhythm  Abdomen: Soft nontender  Extremities: 2+ femoral pulses bilaterally absent popliteal and pedal pulses bilaterally  Skin: Dependent rubor elevation pallor lower left foot 4 mm shallow pretibial ulcer left leg  Data:: Patient had bilateral ABIs performed today.  I reviewed and interpreted the study.  Left side was 0.31 right side was 0.97  Assessment: Patient with very short distance claudication early rest pain and a fairly ischemic looking left foot at this point.  Plan: Patient is scheduled for aortogram lower extremity runoff possible intervention August 30, 2018.  Risk benefits possible complications procedure details including but not limited to bleeding infection contrast reaction vessel injury were explained the patient today.  She understands and agrees to proceed.  She also understands that if the she has extensive atherosclerosis she may not be a candidate for  percutaneous intervention and may require bypass that would be planned out for the future.  Ruta Hinds, MD [facility] Office: [phone] Pager: [phone]

## 2018-08-16 ENCOUNTER — Encounter: Payer: Self-pay | Admitting: Family Medicine

## 2018-08-21 ENCOUNTER — Ambulatory Visit (INDEPENDENT_AMBULATORY_CARE_PROVIDER_SITE_OTHER): Payer: Medicare Other | Admitting: Psychiatry

## 2018-08-21 ENCOUNTER — Encounter: Payer: Self-pay | Admitting: Psychiatry

## 2018-08-21 DIAGNOSIS — G4721 Circadian rhythm sleep disorder, delayed sleep phase type: Secondary | ICD-10-CM | POA: Diagnosis not present

## 2018-08-21 DIAGNOSIS — F411 Generalized anxiety disorder: Secondary | ICD-10-CM | POA: Diagnosis not present

## 2018-08-21 DIAGNOSIS — G3184 Mild cognitive impairment, so stated: Secondary | ICD-10-CM

## 2018-08-21 DIAGNOSIS — G2581 Restless legs syndrome: Secondary | ICD-10-CM

## 2018-08-21 DIAGNOSIS — F331 Major depressive disorder, recurrent, moderate: Secondary | ICD-10-CM

## 2018-08-21 NOTE — Patient Instructions (Signed)
If you have not tried this get a pillbox to see if it helps to be more consistent with the medications.

## 2018-08-21 NOTE — Progress Notes (Signed)
[name] [id number] 10-Jul-1946 72 y.o.  Subjective:   Patient ID:  [name] is a 72 y.o. (DOB 1946/08/04) female.  Chief Complaint:  Chief Complaint  Patient presents with  . Follow-up    Depression    HPI REGENIA ERCK presents to the office today for follow-up of depression and anxiety and MCI.   "Lousy"  Grieving sister.  All kins of emotions, including anger.  Hard to suggest. Tired all the time. Zero [employer].  B in law with ministrokes.  A bit of stress and doesn't do much for fun.  Poor diet  RLS managed.  Sleep pretty good, except irregular hours.Pt reports that mood is Depressed and describes anxiety as Minimal. Anxiety symptoms include: Excessive Worry, Panic Symptoms,. Pt reports irregular sleep pattern chronically. Pt reports that appetite is decreased. Pt reports that energy is poor and loss of interest or pleasure in usual activities, poor motivation and withdrawn from usual activities. Concentration is down slightly. Suicidal thoughts:  denied by patient.  Has forgotten to take the lamotrigine BID and it's only once daily.  Often forgetting meds. Review of Systems:  Review of Systems  Constitutional: Positive for fatigue.  Respiratory: Positive for shortness of breath.   Cardiovascular:       Claudication   Neurological: Negative for tremors and weakness.  Psychiatric/Behavioral: Positive for dysphoric mood. Negative for agitation, behavioral problems, confusion, decreased concentration, hallucinations, self-injury, sleep disturbance and suicidal ideas. The patient is not nervous/anxious and is not hyperactive.    LEG surgery for claudication Jan 3  Medications: I have reviewed the patient's current medications.  Current Outpatient Medications  Medication Sig Dispense Refill  . amLODipine (NORVASC) 10 MG tablet Take 1 tablet (10 mg total) by mouth daily. 30 tablet 4  . aspirin EC 325 MG EC tablet Take 1 tablet (325 mg total) by mouth daily. 30 tablet  4  . bisacodyl (DULCOLAX) 5 MG EC tablet Take 10 mg by mouth at bedtime.     . clonazePAM (KLONOPIN) 0.5 MG tablet Take 0.5mg  tab every bedtime and 1 tab PRN for anxiety 40 tablet 0  . DULoxetine (CYMBALTA) 30 MG capsule 30 mg at bedtime.    . DULoxetine (CYMBALTA) 60 MG capsule Take 60 mg by mouth every morning.     . famotidine (PEPCID) 20 MG tablet One at bedtime 30 tablet 11  . lamoTRIgine (LAMICTAL) 150 MG tablet Take 150 mg by mouth 2 (two) times daily.    Marland Kitchen linaclotide (LINZESS) 145 MCG CAPS capsule Take 1 capsule (145 mcg total) by mouth daily before breakfast. 30 capsule 3  . losartan (COZAAR) 50 MG tablet Take 1 tablet (50 mg total) by mouth daily. 30 tablet 4  . mupirocin ointment (BACTROBAN) 2 % Apply 1 application topically 2 (two) times daily. 22 g 0  . polyethylene glycol (MIRALAX / GLYCOLAX) packet Take 34 g by mouth every other day.     Marland Kitchen rOPINIRole (REQUIP) 2 MG tablet TAKE 1 TABLET BY MOUTH AT BEDTIME (Patient taking differently: Take 2 mg by mouth 2 (two) times daily. ) 30 tablet 0  . senna (SENOKOT) 8.6 MG TABS tablet Take 2 tablets by mouth at bedtime.     . CASCARA SAGRADA PO Take 2 capsules by mouth at bedtime.    . meclizine (ANTIVERT) 12.5 MG tablet Take 1 tablet (12.5 mg total) by mouth 3 (three) times daily. (Patient not taking: Reported on 08/15/2018) 90 tablet 2   No current facility-administered  medications for this visit.     Medication Side Effects: None  Allergies: No Known Allergies  Past Medical History:  Diagnosis Date  . Bipolar disorder ([place])   . CAD (coronary artery disease)    intra and extracranial vascular dx per MRI 4/11, neurology rec strict CVRF control  . Colonic inertia   . Constipation    chronic;severe  . Depression   . Duodenitis   . EKG abnormalities    changes, stress test neg (false EKG changes)  . Gastritis   . GERD (gastroesophageal reflux disease)   . Hypertension   . Hypothyroid 01/16/2014  . Meningioma ([place]) 10/21/2013  .  Psoriasis    sees derm  . Stroke [facility])     Family History  Problem Relation Age of Onset  . Breast cancer Mother        metastisis to bones  . Diabetes Son   . Heart disease Unknown        grandfather   . Alcohol abuse Brother   . Heart disease Brother   . Heart disease Maternal Aunt   . Lung cancer Brother        smoked  . Colon cancer Neg Hx     Social History   Socioeconomic History  . Marital status: Married    Spouse name: Not on file  . Number of children: 1  . Years of education: Not on file  . Highest education level: Not on file  Occupational History  . Occupation: retired  Scientific laboratory technician  . Financial resource strain: Not on file  . Food insecurity:    Worry: Not on file    Inability: Not on file  . Transportation needs:    Medical: Not on file    Non-medical: Not on file  Tobacco Use  . Smoking status: Current Every Day Smoker    Packs/day: 1.00    Years: 60.00    Pack years: 60.00    Types: Cigarettes  . Smokeless tobacco: Never Used  Substance and Sexual Activity  . Alcohol use: Yes    Alcohol/week: 0.0 standard drinks    Comment: yes on occassion  . Drug use: No  . Sexual activity: Not on file  Lifestyle  . Physical activity:    Days per week: Not on file    Minutes per session: Not on file  . Stress: Not on file  Relationships  . Social connections:    Talks on phone: Not on file    Gets together: Not on file    Attends religious service: Not on file    Active member of club or organization: Not on file    Attends meetings of clubs or organizations: Not on file    Relationship status: Not on file  . Intimate partner violence:    Fear of current or ex partner: Not on file    Emotionally abused: Not on file    Physically abused: Not on file    Forced sexual activity: Not on file  Other Topics Concern  . Not on file  Social History Narrative   Brother in law Mr Rexford Maus (one of my patients)   Lives w/ husband        Past  Medical History, Surgical history, Social history, and Family history were reviewed and updated as appropriate.   Please see review of systems for further details on the patient's review from today.   Objective:   Physical Exam:  There were no vitals taken for  this visit.  Physical Exam  Constitutional: She is oriented to person, place, and time. She appears well-developed. No distress.  Musculoskeletal: She exhibits no deformity.  Neurological: She is alert and oriented to person, place, and time. She displays no tremor. Coordination and gait normal.  Psychiatric: Her speech is normal and behavior is normal. Judgment and thought content normal. Her mood appears not anxious. Her affect is not angry, not blunt, not labile and not inappropriate. She exhibits a depressed mood. She expresses no homicidal and no suicidal ideation. She expresses no suicidal plans and no homicidal plans. She exhibits abnormal recent memory.  Insight intact. No auditory or visual hallucinations. No delusions.  MCI not markedly worse     Lab Review:     Component Value Date/Time   NA 134 (L) 07/15/2018 0541   NA 139 03/05/2018 1758   K 3.4 (L) 07/15/2018 0541   CL 100 07/15/2018 0541   CO2 29 07/15/2018 0541   GLUCOSE 134 (H) 07/15/2018 0541   BUN 6 (L) 07/15/2018 0541   BUN 13 03/05/2018 1758   CREATININE 0.77 07/15/2018 0541   CREATININE 0.77 07/28/2015 1805   CALCIUM 8.4 (L) 07/15/2018 0541   PROT 6.1 (L) 07/09/2018 0757   PROT 7.4 03/05/2018 1758   ALBUMIN 3.0 (L) 07/09/2018 0757   ALBUMIN 4.4 03/05/2018 1758   AST 12 (L) 07/09/2018 0757   ALT 9 07/09/2018 0757   ALKPHOS 65 07/09/2018 0757   BILITOT 0.6 07/09/2018 0757   BILITOT 0.3 03/05/2018 1758   GFRNONAA >60 07/15/2018 0541   GFRAA >60 07/15/2018 0541       Component Value Date/Time   WBC 9.2 07/11/2018 0239   RBC 3.86 (L) 07/11/2018 0239   HGB 10.9 (L) 07/11/2018 0239   HGB 13.6 03/05/2018 1758   HCT 35.3 (L) 07/11/2018 0239    HCT 41.9 03/05/2018 1758   PLT 208 07/11/2018 0239   PLT 241 03/05/2018 1758   MCV 91.5 07/11/2018 0239   MCV 88.6 04/30/2018 1628   MCV 93 03/05/2018 1758   MCH 28.2 07/11/2018 0239   MCHC 30.9 07/11/2018 0239   RDW 13.1 07/11/2018 0239   RDW 14.5 03/05/2018 1758   LYMPHSABS 2.7 10/23/2017 1630   MONOABS 0.6 10/23/2017 1630   EOSABS 0.2 10/23/2017 1630   BASOSABS 0.0 10/23/2017 1630    No results found for: POCLITH, LITHIUM   No results found for: PHENYTOIN, PHENOBARB, VALPROATE, CBMZ   .res Assessment: Plan:    Major depressive disorder, recurrent episode, moderate (HCC)  Generalized anxiety disorder  Mild cognitive impairment - DT ministrokes  Restless legs syndrome  Delayed sleep phase syndrome  Rhena has some chronic depression and chronic irregular sleep patterns which have been resistant to treatment.  She is accidentally reduced the lamotrigine to 1 daily instead of 2. Hesitate to increase lamotrigine DT her age and risk of SE including balance and cognitive issues.  Cont counseling for grief, depression, anxiety.  Use pill box to improve compliance.  Discussed serotonin withdrawal symptoms.  Also the risk of greater depression because of missing the meds.  She stopped the donepezil bc GI upset.  Consider Exelon patch for cognition.  Sleep hygiene issues discussed although this is been very resistant to change.  Restless legs is controlled with ropinirole  This was a 30-minute appointment  FU 3-4 mos  Lynder Parents, MD, DFAPA  Please see After Visit Summary for patient specific instructions.  Future Appointments  Date Time Provider [place]  09/12/2018  2:30 PM Kathrynn Ducking, MD [facility] None    No orders of the defined types were placed in this encounter.     -------------------------------

## 2018-09-07 ENCOUNTER — Other Ambulatory Visit: Payer: Self-pay | Admitting: Family Medicine

## 2018-09-07 DIAGNOSIS — S81802A Unspecified open wound, left lower leg, initial encounter: Secondary | ICD-10-CM

## 2018-09-07 DIAGNOSIS — L03116 Cellulitis of left lower limb: Secondary | ICD-10-CM

## 2018-09-09 ENCOUNTER — Other Ambulatory Visit: Payer: Self-pay | Admitting: Psychiatry

## 2018-09-09 MED ORDER — LAMOTRIGINE 150 MG PO TABS: 150.0000 mg | ORAL_TABLET | Freq: Two times a day (BID) | ORAL | 0 refills | Status: DC

## 2018-09-09 NOTE — Progress Notes (Signed)
Lamotrigine refill 90 days 150 mg twice daily

## 2018-09-09 NOTE — Telephone Encounter (Signed)
Please advise 

## 2018-09-10 ENCOUNTER — Telehealth: Payer: Self-pay | Admitting: Cardiovascular Disease

## 2018-09-10 NOTE — Telephone Encounter (Signed)
Called patient and LVM to call back to schedule office visit with Dr. Gwenlyn Found.

## 2018-09-10 NOTE — Telephone Encounter (Signed)
bactroban refilled once, but recommend recheck if new wounds

## 2018-09-12 ENCOUNTER — Ambulatory Visit: Payer: Medicare Other | Admitting: Neurology

## 2018-09-13 ENCOUNTER — Ambulatory Visit (HOSPITAL_COMMUNITY)
Admission: RE | Admit: 2018-09-13 | Discharge: 2018-09-13 | Disposition: A | Discharge status: Home / Self Care | Payer: Medicare Other | Attending: Vascular Surgery | Admitting: Vascular Surgery

## 2018-09-13 ENCOUNTER — Other Ambulatory Visit: Payer: Self-pay

## 2018-09-13 ENCOUNTER — Encounter (HOSPITAL_COMMUNITY)
Admission: RE | Disposition: A | Discharge status: Home / Self Care | Payer: Self-pay | Source: Home / Self Care | Attending: Vascular Surgery

## 2018-09-13 ENCOUNTER — Telehealth: Payer: Self-pay | Admitting: Cardiovascular Disease

## 2018-09-13 DIAGNOSIS — F319 Bipolar disorder, unspecified: Secondary | ICD-10-CM | POA: Insufficient documentation

## 2018-09-13 DIAGNOSIS — E039 Hypothyroidism, unspecified: Secondary | ICD-10-CM | POA: Insufficient documentation

## 2018-09-13 DIAGNOSIS — K219 Gastro-esophageal reflux disease without esophagitis: Secondary | ICD-10-CM | POA: Diagnosis not present

## 2018-09-13 DIAGNOSIS — Z8673 Personal history of transient ischemic attack (TIA), and cerebral infarction without residual deficits: Secondary | ICD-10-CM | POA: Insufficient documentation

## 2018-09-13 DIAGNOSIS — F17210 Nicotine dependence, cigarettes, uncomplicated: Secondary | ICD-10-CM | POA: Diagnosis not present

## 2018-09-13 DIAGNOSIS — I70222 Atherosclerosis of native arteries of extremities with rest pain, left leg: Secondary | ICD-10-CM | POA: Diagnosis not present

## 2018-09-13 DIAGNOSIS — I10 Essential (primary) hypertension: Secondary | ICD-10-CM | POA: Diagnosis not present

## 2018-09-13 DIAGNOSIS — Z7982 Long term (current) use of aspirin: Secondary | ICD-10-CM | POA: Diagnosis not present

## 2018-09-13 DIAGNOSIS — I2510 Atherosclerotic heart disease of native coronary artery without angina pectoris: Secondary | ICD-10-CM | POA: Insufficient documentation

## 2018-09-13 HISTORY — PX: ABDOMINAL AORTOGRAM W/LOWER EXTREMITY: CATH118223

## 2018-09-13 LAB — POCT I-STAT, CHEM 8
BUN: 21 mg/dL (ref 8–23)
CALCIUM ION: 1.26 mmol/L (ref 1.15–1.40)
Chloride: 98 mmol/L (ref 98–111)
Creatinine, Ser: 0.7 mg/dL (ref 0.44–1.00)
Glucose, Bld: 96 mg/dL (ref 70–99)
HCT: 41 % (ref 36.0–46.0)
Hemoglobin: 13.9 g/dL (ref 12.0–15.0)
Potassium: 3.9 mmol/L (ref 3.5–5.1)
Sodium: 137 mmol/L (ref 135–145)
TCO2: 30 mmol/L (ref 22–32)

## 2018-09-13 LAB — TROPONIN I: Troponin I: <0.03 ng/mL (ref <0.03)

## 2018-09-13 SURGERY — ABDOMINAL AORTOGRAM W/LOWER EXTREMITY
Anesthesia: LOCAL

## 2018-09-13 MED ORDER — LORAZEPAM 0.5 MG PO TABS
ORAL_TABLET | ORAL | Status: AC
  Filled 2018-09-13: qty 2

## 2018-09-13 MED ORDER — HEPARIN (PORCINE) IN NACL 1000-0.9 UT/500ML-% IV SOLN
INTRAVENOUS | Status: DC | PRN
  Administered 2018-09-13 (×2): 500 mL

## 2018-09-13 MED ORDER — LORAZEPAM 0.5 MG PO TABS: 1.0000 mg | ORAL_TABLET | ORAL | Status: DC | PRN

## 2018-09-13 MED ORDER — LABETALOL HCL 5 MG/ML IV SOLN
10.0000 mg | INTRAVENOUS | Status: DC | PRN
  Administered 2018-09-13: 10 mg via INTRAVENOUS

## 2018-09-13 MED ORDER — ONDANSETRON HCL 4 MG/2ML IJ SOLN: 4.0000 mg | Freq: Four times a day (QID) | INTRAMUSCULAR | Status: DC | PRN

## 2018-09-13 MED ORDER — FENTANYL CITRATE (PF) 100 MCG/2ML IJ SOLN
INTRAMUSCULAR | Status: AC
  Filled 2018-09-13: qty 2

## 2018-09-13 MED ORDER — SODIUM CHLORIDE 0.9% FLUSH: 3.0000 mL | Freq: Two times a day (BID) | INTRAVENOUS | Status: DC

## 2018-09-13 MED ORDER — OXYCODONE HCL 5 MG PO TABS
ORAL_TABLET | ORAL | Status: AC
  Filled 2018-09-13: qty 2

## 2018-09-13 MED ORDER — LORAZEPAM 0.5 MG PO TABS
1.0000 mg | ORAL_TABLET | ORAL | Status: AC
  Administered 2018-09-13: 1 mg via ORAL
  Filled 2018-09-13: qty 1

## 2018-09-13 MED ORDER — OXYCODONE HCL 5 MG PO TABS
5.0000 mg | ORAL_TABLET | ORAL | Status: DC | PRN
  Administered 2018-09-13: 10 mg via ORAL

## 2018-09-13 MED ORDER — SODIUM CHLORIDE 0.9 % IV SOLN: 250.0000 mL | INTRAVENOUS | Status: DC | PRN

## 2018-09-13 MED ORDER — ACETAMINOPHEN 325 MG PO TABS: 650.0000 mg | ORAL_TABLET | ORAL | Status: DC | PRN

## 2018-09-13 MED ORDER — LABETALOL HCL 5 MG/ML IV SOLN
INTRAVENOUS | Status: AC
  Filled 2018-09-13: qty 4

## 2018-09-13 MED ORDER — FENTANYL CITRATE (PF) 100 MCG/2ML IJ SOLN
INTRAMUSCULAR | Status: DC | PRN
  Administered 2018-09-13: 25 ug via INTRAVENOUS

## 2018-09-13 MED ORDER — LIDOCAINE HCL (PF) 1 % IJ SOLN
INTRAMUSCULAR | Status: AC
  Filled 2018-09-13: qty 30

## 2018-09-13 MED ORDER — HEPARIN (PORCINE) IN NACL 1000-0.9 UT/500ML-% IV SOLN
INTRAVENOUS | Status: AC
  Filled 2018-09-13: qty 1000

## 2018-09-13 MED ORDER — MIDAZOLAM HCL 2 MG/2ML IJ SOLN
INTRAMUSCULAR | Status: DC | PRN
  Administered 2018-09-13: 1 mg via INTRAVENOUS

## 2018-09-13 MED ORDER — IODIXANOL 320 MG/ML IV SOLN
INTRAVENOUS | Status: DC | PRN
  Administered 2018-09-13: 165 mL via INTRA_ARTERIAL

## 2018-09-13 MED ORDER — MIDAZOLAM HCL 2 MG/2ML IJ SOLN
INTRAMUSCULAR | Status: AC
  Filled 2018-09-13: qty 2

## 2018-09-13 MED ORDER — SODIUM CHLORIDE 0.9% FLUSH: 3.0000 mL | INTRAVENOUS | Status: DC | PRN

## 2018-09-13 MED ORDER — SODIUM CHLORIDE 0.9 % IV SOLN: INTRAVENOUS | Status: AC

## 2018-09-13 MED ORDER — HYDRALAZINE HCL 20 MG/ML IJ SOLN: 5.0000 mg | INTRAMUSCULAR | Status: DC | PRN

## 2018-09-13 MED ORDER — SODIUM CHLORIDE 0.9 % IV SOLN
INTRAVENOUS | Status: DC
  Administered 2018-09-13: 07:00:00 via INTRAVENOUS

## 2018-09-13 MED ORDER — LIDOCAINE HCL (PF) 1 % IJ SOLN
INTRAMUSCULAR | Status: DC | PRN
  Administered 2018-09-13: 20 mL via INTRADERMAL

## 2018-09-13 MED ORDER — MORPHINE SULFATE (PF) 10 MG/ML IV SOLN: 2.0000 mg | INTRAVENOUS | Status: DC | PRN

## 2018-09-13 MED ORDER — HYDRALAZINE HCL 20 MG/ML IJ SOLN
INTRAMUSCULAR | Status: DC | PRN
  Administered 2018-09-13: 10 mg via INTRAVENOUS

## 2018-09-13 SURGICAL SUPPLY — 8 items
CATH ANGIO 5F PIGTAIL 65CM (CATHETERS) ×2 IMPLANT
KIT PV (KITS) ×2 IMPLANT
SHEATH PINNACLE 5F 10CM (SHEATH) ×2 IMPLANT
SHEATH PROBE COVER 6X72 (BAG) ×2 IMPLANT
SYR MEDRAD MARK 7 150ML (SYRINGE) ×2 IMPLANT
TRANSDUCER W/STOPCOCK (MISCELLANEOUS) ×2 IMPLANT
TRAY PV CATH (CUSTOM PROCEDURE TRAY) ×2 IMPLANT
WIRE HITORQ VERSACORE ST 145CM (WIRE) ×2 IMPLANT

## 2018-09-13 NOTE — Progress Notes (Signed)
Patient still having chest pressure 3/10. Dr. Oneida Alar made aware. Troponin ordered. OK per Dr. Oneida Alar to go to Short Stay

## 2018-09-13 NOTE — Telephone Encounter (Signed)
Pt in hospital today s/p PV angiogram- call Monday.  Kerin Ransom PA-C 09/13/2018 3:52 PM

## 2018-09-13 NOTE — Progress Notes (Signed)
12 lead EKG done and shown to Dr. Oneida Alar

## 2018-09-13 NOTE — Interval H&P Note (Signed)
History and Physical Interval Note:  09/13/2018 7:37 AM  [name]  has presented today for surgery, with the diagnosis of pad  The various methods of treatment have been discussed with the patient and family. After consideration of risks, benefits and other options for treatment, the patient has consented to  Procedure(s): ABDOMINAL AORTOGRAM W/LOWER EXTREMITY (N/A) as a surgical intervention .  The patient's history has been reviewed, patient examined, no change in status, stable for surgery.  I have reviewed the patient's chart and labs.  Questions were answered to the patient's satisfaction.     Ruta Hinds

## 2018-09-13 NOTE — Progress Notes (Signed)
Site area: rt groin fa sheath pulled and pressure held by Roderic Palau Site Prior to Removal:  Level 0 Pressure Applied For:  20 minutes Manual:   yes Patient Status During Pull:  stable Post Pull Site:  Level  0 Post Pull Instructions Given:  yes Post Pull Pulses Present: rt pt dopplered Dressing Applied:  Gauze and tegaderm Bedrest begins @  0915 Comments:

## 2018-09-13 NOTE — Progress Notes (Signed)
Patient noted to have some mild chest pain in the recovery area.  EKG was fairly unremarkable.  A troponin level is pending.  She has a high anxiety component.  From my standpoint she can return to short stay and undergo further work-up.  Ativan was ordered as needed for her anxiety.  Ruta Hinds, MD [facility] [place] Office: [phone] Pager: [phone]

## 2018-09-13 NOTE — Discharge Instructions (Signed)

## 2018-09-13 NOTE — Op Note (Signed)
Procedure: Abdominal aortogram with bilateral lower extremity runoff  Preoperative diagnosis: Rest pain left foot  Postoperative diagnosis: Same  Anesthesia: Local with IV sedation  Operative findings: #1 chronic left popliteal occlusion with reconstitution of the below-knee popliteal artery one-vessel peroneal runoff  2.  Inline flow atherosclerotic change right leg with two-vessel runoff peroneal posterior tibial  3.  No significant aortoiliac occlusive disease  4.  Possible small abdominal aortic aneurysm which correlates with 3 cm abdominal aortic aneurysm seen on CT scan November 2019  Operative details: After obtaining form consent, the patient was taken the [place] lab.  The patient was placed in supine position on the angios table.  Both groins were prepped and draped in usual sterile fashion.  Local anesthesia was infiltrated over the right common femoral artery.  Ultrasound was used to identify the right common femoral artery and femoral bifurcation.  This was patent.  A hardcopy image was obtained for the patient's record.  Using ultrasound guidance the right common femoral artery was successfully cannulated and an 035 versa core wire threaded in the abdominal aorta under fluoroscopic guidance.  A 5 French sheath was placed over the guidewire in the right common femoral artery.  This was thoroughly flushed with heparinized saline.  A 5 French pigtail catheter was then advanced over the guidewire into the abdominal aorta.  Abdominal aortogram was then obtained in AP projection.  This shows atherosclerotic change of the infrarenal abdominal aorta no significant renal artery stenosis.  The infrarenal abdominal aorta is slightly aneurysmal.  This correlates with the previous CT scan from November 2019 showing a 3 cm abdominal aortic aneurysm.  There is extensive bowel gas so bilateral oblique views of the pelvis were obtained to further clarify the iliac system after pulling the catheter down just  above the aortic bifurcation.  There is no significant iliac occlusive disease.  The left external and internal iliac arteries are patent.  At this point bilateral lower extremity runoff views were obtained through the pigtail catheter.  In the right lower extremity, the right common femoral profunda femoris and superficial femoral arteries are patent.  There is atherosclerotic change in some 25 to 40% narrowings of the right superficial femoral artery.  The below-knee popliteal and above-knee popliteal arteries patent.  There is two-vessel runoff via the peroneal and posterior tibial artery to the right foot.  The anterior tibial artery is occluded.  In the left lower extremity, the left common femoral profunda femoris and superficial femoral arteries are patent.  The above-knee Patel artery is occluded.  The below-knee popliteal artery does reconstitute via collaterals.  It is fairly small in diameter.  The anterior tibial artery is occluded.  The proximal portion of the posterior tibial artery is patent but it occludes the ankle.  There is one-vessel runoff by the peroneal artery which gives off anterior posterior communicating branches to the foot.  At this point the pigtail catheter was removed over guidewire.  The 5 French sheath was left in place to be pulled in the holding area.  The patient tolerated procedure well and there were no complications.  Patient was taken to the holding area in stable condition.  Operative management: The patient will be scheduled in the near future for a left lower extremity vein mapping followed by a left lower extremity femoral to below-knee popliteal bypass.  Will obtain cardiac or stratification prior to this.  Ruta Hinds, MD [facility] [place] Office: [phone] Pager: [phone]

## 2018-09-13 NOTE — Telephone Encounter (Signed)
New Message   Date of Procedure Waiting for Cardiac Clearance. Wanting to do it within the next 2 weeks  Type of Procedure Lower extremity femoral below knee popliteale bypass Dr doing Procedure Dr. Dereck Ligas [phone] [phone] Is Pt hold medication or surgical clearance? Dont know  What type of anesthesia? Dont know    Fax to [place] unavailable

## 2018-09-13 NOTE — Progress Notes (Signed)
Lab here drawing troponin

## 2018-09-16 ENCOUNTER — Encounter (HOSPITAL_COMMUNITY): Payer: Self-pay | Admitting: Vascular Surgery

## 2018-09-16 ENCOUNTER — Encounter: Payer: Self-pay | Admitting: Family Medicine

## 2018-09-17 ENCOUNTER — Telehealth: Payer: Self-pay | Admitting: *Deleted

## 2018-09-17 NOTE — Telephone Encounter (Signed)
LMOM for pt to call our office back to schedule appt with Dr. Gwenlyn Found or App on team to be cleared for surgery.

## 2018-09-17 NOTE — Telephone Encounter (Signed)
Request for Surgical Clearance  1. What type of surgery is being performed?  LEFT FEMORAL TO BELOW KNEE POPLITEAL ARTERY BYPASS GRAFT    2. When is this surgery scheduled? PENDING CLEARANCE  3. What type of clearance is required (medical clearance vs. Pharmacy clearance to hold med vs. Both)? CARDIAC CLEARANCE     4. Are there any medications that need to be held prior to surgery and how long?  NONE    5. Practice name and name of physician performing surgery?   DR. Juanda Crumble [facility]    6.  What is your office phone number? [phone]    7. What is your office fax number? (Be sure to include anyone who it needs to go Attn to) [place]    8. Anesthesia type (None, local, MAC, general)? GENERAL   REMINDER TO USER: Remember to please route this message to P CV DIV PREOP in a phone note.

## 2018-09-17 NOTE — Telephone Encounter (Addendum)
   Primary Cardiologist:Jonathan Gwenlyn Found, MD  Chart reviewed as part of pre-operative protocol coverage.   73 yo female with PAD, prior CVA, tobacco abuse, FHx of CAD.  LHC in 2008 demonstrated normal coronary arteries.  Myoview in 3/18 was neg for ischemia.  Last seen by Dr. Gwenlyn Found 3/19 (> 6 mos ago).  The patient needs a fem-pop bypass.    Because of [name] need for vascular surgery and time since last visit, she requires a follow-up visit in order to better assess preoperative cardiovascular risk.  Call back staff: - Please schedule appointment with Dr. Gwenlyn Found or APP on care team and call patient to inform them. - Please contact requesting surgeon's office via preferred method (i.e, phone, fax) to inform them of need for appointment prior to surgery. - Please forward this note to the provider that sees the patient for surgical clearance so that he/she can forward recommendations to the requesting provider. - This note will be removed from the preop pool.   Richardson Dopp, PA-C  09/17/2018, 3:06 PM

## 2018-09-18 NOTE — Telephone Encounter (Signed)
Pt has appointment with Kerin Ransom, PA on 09/26/18 for surgical clearance.

## 2018-09-18 NOTE — Telephone Encounter (Signed)
Pt is scheduled to see Kerin Ransom on 09/26/18. Clearance will be addressed at visit. I have notified requesting office of appt.

## 2018-09-23 ENCOUNTER — Ambulatory Visit (HOSPITAL_COMMUNITY)
Admission: RE | Admit: 2018-09-23 | Discharge: 2018-09-23 | Disposition: A | Discharge status: Home / Self Care | Payer: Medicare Other | Source: Ambulatory Visit | Attending: Surgery | Admitting: Surgery

## 2018-09-23 ENCOUNTER — Other Ambulatory Visit: Payer: Self-pay

## 2018-09-23 ENCOUNTER — Other Ambulatory Visit (HOSPITAL_COMMUNITY): Payer: Medicare Other

## 2018-09-23 DIAGNOSIS — I70212 Atherosclerosis of native arteries of extremities with intermittent claudication, left leg: Secondary | ICD-10-CM | POA: Insufficient documentation

## 2018-09-23 DIAGNOSIS — I739 Peripheral vascular disease, unspecified: Secondary | ICD-10-CM

## 2018-09-26 ENCOUNTER — Encounter: Payer: Self-pay | Admitting: Cardiology

## 2018-09-26 ENCOUNTER — Ambulatory Visit (INDEPENDENT_AMBULATORY_CARE_PROVIDER_SITE_OTHER): Payer: Medicare Other | Admitting: Cardiology

## 2018-09-26 VITALS — BP 134/86 | HR 74 | Ht 69.0 in | Wt 160.0 lb

## 2018-09-26 DIAGNOSIS — E785 Hyperlipidemia, unspecified: Secondary | ICD-10-CM

## 2018-09-26 DIAGNOSIS — I998 Other disorder of circulatory system: Secondary | ICD-10-CM | POA: Diagnosis not present

## 2018-09-26 DIAGNOSIS — F17200 Nicotine dependence, unspecified, uncomplicated: Secondary | ICD-10-CM | POA: Diagnosis not present

## 2018-09-26 DIAGNOSIS — Z01810 Encounter for preprocedural cardiovascular examination: Secondary | ICD-10-CM

## 2018-09-26 DIAGNOSIS — I70212 Atherosclerosis of native arteries of extremities with intermittent claudication, left leg: Secondary | ICD-10-CM

## 2018-09-26 DIAGNOSIS — I10 Essential (primary) hypertension: Secondary | ICD-10-CM | POA: Diagnosis not present

## 2018-09-26 DIAGNOSIS — Z8673 Personal history of transient ischemic attack (TIA), and cerebral infarction without residual deficits: Secondary | ICD-10-CM

## 2018-09-26 DIAGNOSIS — I70229 Atherosclerosis of native arteries of extremities with rest pain, unspecified extremity: Secondary | ICD-10-CM

## 2018-09-26 MED ORDER — ATORVASTATIN CALCIUM 40 MG PO TABS: 40.0000 mg | ORAL_TABLET | Freq: Every day | ORAL | 5 refills | Status: DC

## 2018-09-26 NOTE — Progress Notes (Signed)
09/26/2018 [name]   1946/07/25  [id number]  Primary Physician [name], MD Primary Cardiologist: Dr Gwenlyn Found  HPI: The patient is a pleasant 73 year old female with a history of smoking and COPD, vascular disease, stroke Feb 2019 without residual, and a family history of coronary disease. She had a low risk Myoview in March 2018.  Echo done Feb 2019 (when she had a Lt lacunar stroke) showed normal LVF with moderate to severe LVH and grade 1 DD.  An Event Monitor was ordered for March 2019 after her stroke.  She says she wore it for a week and was told it was OK-(I could not find this report).     She is referred today for preop clearance.  She has critical ischemia in her left leg.  Angiogram done by Dr. Juanda Crumble fields 09/13/2018 showed chronic left popliteal occlusion.  The patient needs left lower extremity fem- pop bypass.  She apparently had some chest pain in the recovery room after her angiogram.  She admitted to me that she was very uncomfortable in the recovery and became upset.  Her chest pain went away with a sedative.  She has had no further chest pain.  Her activity is limited secondary to LLE claudication but she denies any chest pain doing her usual activities.    Current Outpatient Medications  Medication Sig Dispense Refill  . amLODipine (NORVASC) 10 MG tablet Take 1 tablet (10 mg total) by mouth daily. 30 tablet 4  . aspirin EC 325 MG EC tablet Take 1 tablet (325 mg total) by mouth daily. 30 tablet 4  . bisacodyl (DULCOLAX) 5 MG EC tablet Take 10 mg by mouth at bedtime.     . CASCARA SAGRADA PO Take 2 capsules by mouth at bedtime.    . clonazePAM (KLONOPIN) 0.5 MG tablet Take 0.5mg  tab every bedtime and 1 tab PRN for anxiety 40 tablet 0  . DULoxetine (CYMBALTA) 30 MG capsule 30 mg at bedtime.    . DULoxetine (CYMBALTA) 60 MG capsule Take 60 mg by mouth every morning.     . famotidine (PEPCID) 20 MG tablet One at bedtime 30 tablet 11  . lamoTRIgine (LAMICTAL) 150  MG tablet Take 1 tablet (150 mg total) by mouth 2 (two) times daily. 180 tablet 0  . linaclotide (LINZESS) 145 MCG CAPS capsule Take 1 capsule (145 mcg total) by mouth daily before breakfast. 30 capsule 3  . losartan (COZAAR) 50 MG tablet Take 1 tablet (50 mg total) by mouth daily. 30 tablet 4  . meclizine (ANTIVERT) 12.5 MG tablet Take 1 tablet (12.5 mg total) by mouth 3 (three) times daily. 90 tablet 2  . mupirocin ointment (BACTROBAN) 2 % APPLY EXTERNALLY TO THE AFFECTED AREA TWICE DAILY 22 g 0  . polyethylene glycol (MIRALAX / GLYCOLAX) packet Take 34 g by mouth every other day.     Marland Kitchen rOPINIRole (REQUIP) 2 MG tablet TAKE 1 TABLET BY MOUTH AT BEDTIME (Patient taking differently: Take 2 mg by mouth 2 (two) times daily. ) 30 tablet 0  . senna (SENOKOT) 8.6 MG TABS tablet Take 2 tablets by mouth at bedtime.     Marland Kitchen atorvastatin (LIPITOR) 40 MG tablet Take 1 tablet (40 mg total) by mouth daily. 30 tablet 5   No current facility-administered medications for this visit.     No Known Allergies  Past Medical History:  Diagnosis Date  . Bipolar disorder ([place])   . CAD (coronary artery disease)  intra and extracranial vascular dx per MRI 4/11, neurology rec strict CVRF control  . Colonic inertia   . Constipation    chronic;severe  . Depression   . Duodenitis   . EKG abnormalities    changes, stress test neg (false EKG changes)  . Gastritis   . GERD (gastroesophageal reflux disease)   . Hypertension   . Hypothyroid 01/16/2014  . Meningioma ([place]) 10/21/2013  . Psoriasis    sees derm  . Stroke [facility])     Social History   Socioeconomic History  . Marital status: Married    Spouse name: Not on file  . Number of children: 1  . Years of education: Not on file  . Highest education level: Not on file  Occupational History  . Occupation: retired  Scientific laboratory technician  . Financial resource strain: Not on file  . Food insecurity:    Worry: Not on file    Inability: Not on file  . Transportation  needs:    Medical: Not on file    Non-medical: Not on file  Tobacco Use  . Smoking status: Current Every Day Smoker    Packs/day: 1.00    Years: 60.00    Pack years: 60.00    Types: Cigarettes  . Smokeless tobacco: Never Used  Substance and Sexual Activity  . Alcohol use: Yes    Alcohol/week: 0.0 standard drinks    Comment: yes on occassion  . Drug use: No  . Sexual activity: Not on file  Lifestyle  . Physical activity:    Days per week: Not on file    Minutes per session: Not on file  . Stress: Not on file  Relationships  . Social connections:    Talks on phone: Not on file    Gets together: Not on file    Attends religious service: Not on file    Active member of club or organization: Not on file    Attends meetings of clubs or organizations: Not on file    Relationship status: Not on file  . Intimate partner violence:    Fear of current or ex partner: Not on file    Emotionally abused: Not on file    Physically abused: Not on file    Forced sexual activity: Not on file  Other Topics Concern  . Not on file  Social History Narrative   Brother in law Mr Rexford Maus (one of my patients)   Lives w/ husband         Family History  Problem Relation Age of Onset  . Breast cancer Mother        metastisis to bones  . Diabetes Son   . Heart disease Other        grandfather   . Alcohol abuse Brother   . Heart disease Brother   . Heart disease Maternal Aunt   . Lung cancer Brother        smoked  . Colon cancer Neg Hx      Review of Systems: General: negative for chills, fever, night sweats or weight changes.  Cardiovascular: negative for chest pain, dyspnea on exertion, edema, orthopnea, palpitations, paroxysmal nocturnal dyspnea or shortness of breath Dermatological: negative for rash Respiratory: negative for cough or wheezing Urologic: negative for hematuria Abdominal: negative for nausea, vomiting, diarrhea, bright red blood per rectum, melena, or  hematemesis Neurologic: negative for visual changes, syncope, or dizziness LLE claudication All other systems reviewed and are otherwise negative except as noted above.  Blood pressure 134/86, pulse 74, height 5\' 9"  (1.753 m), weight 160 lb (72.6 kg).  General appearance: alert, cooperative and no distress Neck: no carotid bruit and no JVD Lungs: clear to auscultation bilaterally Heart: regular rate and rhythm Extremities: no edema, LLE dependent rubor Pulses: absent LLE DP, PT pulse Skin: Skin color, texture, turgor normal. No rashes or lesions Neurologic: Grossly normal  EKG 09/13/2018- NSR 83- NSST changes  ASSESSMENT AND PLAN:   Pre-operative cardiovascular examination Pt seen today as a pre op clearance prior to vascula surgery- Lt Fem-Pop  Critical lower limb ischemia Needs Lt Fem-Pop  History of CVA (cerebrovascular accident) Lt lacunar CVA Feb 2019  Smoker Down to "a couple a day"  Essential hypertension Controlled  Dyslipidemia, goal LDL below 70 LDL 104- add statin rx   PLAN   She has had chest pain, has multiple cardiac risk factors, and is for high risk surgery.  I will arrange for a Lexiscan Myoview for pre op clearance.   I'll also add Lipitor 40 mg (LDL 104).  She'll be cleared for surgery if her Myoview is low risk.  F/U Dr Gwenlyn Found in 4-6 weeks.   Kerin Ransom PA-C 09/26/2018 9:52 AM

## 2018-09-26 NOTE — Assessment & Plan Note (Signed)
Down to "a couple a day"

## 2018-09-26 NOTE — Assessment & Plan Note (Signed)
Lt lacunar CVA Feb 2019

## 2018-09-26 NOTE — Assessment & Plan Note (Signed)
Needs Lt Fem-Pop

## 2018-09-26 NOTE — Assessment & Plan Note (Signed)
LDL 104- add statin rx

## 2018-09-26 NOTE — Assessment & Plan Note (Signed)
Controlled.  

## 2018-09-26 NOTE — Assessment & Plan Note (Signed)
Pt seen today as a pre op clearance prior to vascula surgery- Lt Fem-Pop

## 2018-09-26 NOTE — Patient Instructions (Addendum)
Medication Instructions:  START Lipitor 40mg  Take 1 tablet once a day  If you need a refill on your cardiac medications before your next appointment, please call your pharmacy.   Lab work: None  If you have labs (blood work) drawn today and your tests are completely normal, you will receive your results only by: Marland Kitchen MyChart Message (if you have MyChart) OR . A paper copy in the mail If you have any lab test that is abnormal or we need to change your treatment, we will call you to review the results.  Testing/Procedures: Your physician has requested that you have a lexiscan myoview. For further information please visit [email or web address]. Please follow instruction sheet, as given. NO MEDS TO HOLD  Follow-Up: At [facility], you and your health needs are our priority.  As part of our continuing mission to provide you with exceptional heart care, we have created designated Provider Care Teams.  These Care Teams include your primary Cardiologist (physician) and Advanced Practice Providers (APPs -  Physician Assistants and Nurse Practitioners) who all work together to provide you with the care you need, when you need it. You will need a follow up appointment in 4-6 weeks.    You may see Quay Burow, MD ONLY. Any Other Special Instructions Will Be Listed Below (If Applicable).

## 2018-09-27 ENCOUNTER — Telehealth (HOSPITAL_COMMUNITY): Payer: Self-pay

## 2018-09-27 NOTE — Telephone Encounter (Signed)
Encounter complete. 

## 2018-10-01 ENCOUNTER — Telehealth (HOSPITAL_COMMUNITY): Payer: Self-pay

## 2018-10-01 NOTE — Telephone Encounter (Signed)
Encounter complete. 

## 2018-10-02 ENCOUNTER — Ambulatory Visit (HOSPITAL_COMMUNITY)
Admission: RE | Admit: 2018-10-02 | Payer: Medicare Other | Source: Ambulatory Visit | Attending: Cardiology | Admitting: Cardiology

## 2018-10-03 ENCOUNTER — Other Ambulatory Visit (HOSPITAL_COMMUNITY): Payer: Medicare Other

## 2018-10-03 ENCOUNTER — Encounter (HOSPITAL_COMMUNITY): Payer: Medicare Other

## 2018-10-03 ENCOUNTER — Ambulatory Visit: Payer: Medicare Other | Admitting: Vascular Surgery

## 2018-10-03 ENCOUNTER — Ambulatory Visit (HOSPITAL_COMMUNITY)
Admission: RE | Admit: 2018-10-03 | Discharge: 2018-10-03 | Disposition: A | Discharge status: Home / Self Care | Payer: Medicare Other | Source: Ambulatory Visit | Attending: Cardiovascular Disease | Admitting: Cardiovascular Disease

## 2018-10-03 DIAGNOSIS — I998 Other disorder of circulatory system: Secondary | ICD-10-CM | POA: Diagnosis not present

## 2018-10-03 DIAGNOSIS — Z01810 Encounter for preprocedural cardiovascular examination: Secondary | ICD-10-CM

## 2018-10-03 DIAGNOSIS — Z8673 Personal history of transient ischemic attack (TIA), and cerebral infarction without residual deficits: Secondary | ICD-10-CM | POA: Insufficient documentation

## 2018-10-03 DIAGNOSIS — F17200 Nicotine dependence, unspecified, uncomplicated: Secondary | ICD-10-CM | POA: Diagnosis not present

## 2018-10-03 LAB — MYOCARDIAL PERFUSION IMAGING
LV dias vol: 82 mL (ref 46–106)
LV sys vol: 26 mL
Peak HR: 79 {beats}/min
Rest HR: 64 {beats}/min
SDS: 3
SRS: 2
SSS: 5
TID: 1.16

## 2018-10-03 MED ORDER — REGADENOSON 0.4 MG/5ML IV SOLN
0.4000 mg | Freq: Once | INTRAVENOUS | Status: AC
  Administered 2018-10-03: 0.4 mg via INTRAVENOUS

## 2018-10-03 MED ORDER — TECHNETIUM TC 99M TETROFOSMIN IV KIT
10.1000 | PACK | Freq: Once | INTRAVENOUS | Status: AC | PRN
  Administered 2018-10-03: 10.1 via INTRAVENOUS
  Filled 2018-10-03: qty 11

## 2018-10-03 MED ORDER — TECHNETIUM TC 99M TETROFOSMIN IV KIT
31.2000 | PACK | Freq: Once | INTRAVENOUS | Status: AC | PRN
  Administered 2018-10-03: 31.2 via INTRAVENOUS
  Filled 2018-10-03: qty 32

## 2018-10-03 MED ORDER — AMINOPHYLLINE 25 MG/ML IV SOLN
75.0000 mg | Freq: Once | INTRAVENOUS | Status: AC
  Administered 2018-10-03: 75 mg via INTRAVENOUS

## 2018-10-03 NOTE — Telephone Encounter (Signed)
Pt was seen 09/26/2018 for pre op clearance and gave a history of chest pain.  Myoview 10/03/2018 was low risk She is cleared from a cardiac standpoint for proposed surgery.  Kerin Ransom PA-C 10/03/2018 2:51 PM

## 2018-10-04 ENCOUNTER — Other Ambulatory Visit: Payer: Self-pay | Admitting: *Deleted

## 2018-10-04 ENCOUNTER — Telehealth: Payer: Self-pay | Admitting: *Deleted

## 2018-10-04 NOTE — Telephone Encounter (Signed)
Call to patient instructed to be at [facility] admitting department at 5:30 am on 10/22/2018 for surgery. NPO past MN night prior and will be admitted. Expect a call and follow the detailed surgery  instructions received from the hospital pre-admission department. Verbalized understanding.

## 2018-10-08 ENCOUNTER — Ambulatory Visit: Payer: Medicare Other | Admitting: Cardiovascular Disease

## 2018-10-15 ENCOUNTER — Telehealth: Payer: Self-pay

## 2018-10-15 NOTE — Telephone Encounter (Signed)
Returned pt's call regarding her pre-op instructions. She also requested PA/Dr. Oneida Alar call her, as she has some questions regarding the surgery. I have sent a note for Dr. Oneida Alar letting him know her request. Pt has no further questions at this time.

## 2018-10-17 ENCOUNTER — Encounter (HOSPITAL_COMMUNITY): Payer: Medicare Other

## 2018-10-17 ENCOUNTER — Telehealth: Payer: Self-pay | Admitting: Vascular Surgery

## 2018-10-17 ENCOUNTER — Ambulatory Visit: Payer: Medicare Other | Admitting: Vascular Surgery

## 2018-10-17 ENCOUNTER — Other Ambulatory Visit (HOSPITAL_COMMUNITY): Payer: Medicare Other

## 2018-10-17 NOTE — Telephone Encounter (Signed)
Attempted to call pt to discuss upcoming operation at her request.  No answer.  Voicemail not working to leave message.  Ruta Hinds, MD [facility] Office: [phone] Pager: [phone]

## 2018-10-18 ENCOUNTER — Telehealth: Payer: Self-pay | Admitting: Vascular Surgery

## 2018-10-18 NOTE — Pre-Procedure Instructions (Signed)
   PERCY COMP  10/18/2018     [id number] [address], [place] [address] [place] [address] Phone: [phone] Fax: [phone]  [id number] [address], [place] [address] [place] [address] [place] [address] Phone: [phone] Fax: [phone]   Your procedure is scheduled on Tuesday, October 22, 2018  Report to [place] at 5:30 A.M.  Call this number if you have problems the morning of surgery:  [phone]   Remember:  Do not eat or drink after midnight.  Take these medicines the morning of surgery with A SIP OF WATER: aspirin,  DULoxetine (CYMBALTA), lamoTRIgine (LAMICTAL),  Stop taking vitamins, fish oil and herbal medications. Do not take any NSAIDs ie: Ibuprofen, Advil, Naproxen (Aleve), Motrin, BC and Goody Powder; stop now.  Brush your teeth the morning of surgery with your regular toothpaste.   Do not wear jewelry, make-up or nail polish.  Do not wear lotions, powders, or perfumes, or deodorant.  Do not shave 48 hours prior to surgery.    Do not bring valuables to the hospital.  [facility] is not responsible for any belongings or valuables.  Contacts, dentures or bridgework may not be worn into surgery.  Leave your suitcase in the car.  After surgery it may be brought to your room. Special instructions: Shower tonight and the morning of surgery with CHG. Please read over the following fact sheets that you were given. Pain Booklet, Coughing and Deep Breathing, Blood Transfusion Information, MRSA Information and Surgical Site Infection Prevention

## 2018-10-18 NOTE — Telephone Encounter (Signed)
Questions answered about perioperative and post operative recovery time  Ruta Hinds, MD [facility] [place] Office: [phone] Pager: [phone]

## 2018-10-21 ENCOUNTER — Encounter (HOSPITAL_COMMUNITY): Payer: Self-pay

## 2018-10-21 ENCOUNTER — Other Ambulatory Visit: Payer: Self-pay

## 2018-10-21 ENCOUNTER — Encounter (HOSPITAL_COMMUNITY)
Admission: RE | Admit: 2018-10-21 | Discharge: 2018-10-21 | Disposition: A | Discharge status: Home / Self Care | Payer: Medicare Other | Source: Ambulatory Visit | Attending: Vascular Surgery | Admitting: Vascular Surgery

## 2018-10-21 DIAGNOSIS — F17210 Nicotine dependence, cigarettes, uncomplicated: Secondary | ICD-10-CM | POA: Insufficient documentation

## 2018-10-21 DIAGNOSIS — F319 Bipolar disorder, unspecified: Secondary | ICD-10-CM | POA: Insufficient documentation

## 2018-10-21 DIAGNOSIS — Z8673 Personal history of transient ischemic attack (TIA), and cerebral infarction without residual deficits: Secondary | ICD-10-CM | POA: Insufficient documentation

## 2018-10-21 DIAGNOSIS — Z79899 Other long term (current) drug therapy: Secondary | ICD-10-CM

## 2018-10-21 DIAGNOSIS — Z7982 Long term (current) use of aspirin: Secondary | ICD-10-CM | POA: Insufficient documentation

## 2018-10-21 DIAGNOSIS — J439 Emphysema, unspecified: Secondary | ICD-10-CM | POA: Insufficient documentation

## 2018-10-21 DIAGNOSIS — I2510 Atherosclerotic heart disease of native coronary artery without angina pectoris: Secondary | ICD-10-CM

## 2018-10-21 DIAGNOSIS — K219 Gastro-esophageal reflux disease without esophagitis: Secondary | ICD-10-CM | POA: Insufficient documentation

## 2018-10-21 DIAGNOSIS — E039 Hypothyroidism, unspecified: Secondary | ICD-10-CM

## 2018-10-21 DIAGNOSIS — Z01812 Encounter for preprocedural laboratory examination: Secondary | ICD-10-CM

## 2018-10-21 DIAGNOSIS — I10 Essential (primary) hypertension: Secondary | ICD-10-CM | POA: Insufficient documentation

## 2018-10-21 DIAGNOSIS — I739 Peripheral vascular disease, unspecified: Secondary | ICD-10-CM

## 2018-10-21 HISTORY — DX: Presence of spectacles and contact lenses: Z97.3

## 2018-10-21 HISTORY — DX: Cerebrovascular disease, unspecified: I67.9

## 2018-10-21 HISTORY — DX: Peripheral vascular disease, unspecified: I73.9

## 2018-10-21 HISTORY — DX: Anemia, unspecified: D64.9

## 2018-10-21 HISTORY — DX: Unspecified osteoarthritis, unspecified site: M19.90

## 2018-10-21 HISTORY — DX: Acute appendicitis with perforation, localized peritonitis, and gangrene, with abscess: K35.33

## 2018-10-21 HISTORY — DX: Abdominal aortic aneurysm, without rupture, unspecified: I71.40

## 2018-10-21 HISTORY — DX: Atherosclerotic heart disease of native coronary artery without angina pectoris: I25.10

## 2018-10-21 HISTORY — DX: Presence of dental prosthetic device (complete) (partial): Z97.2

## 2018-10-21 HISTORY — DX: Abdominal aortic aneurysm, without rupture: I71.4

## 2018-10-21 LAB — TYPE AND SCREEN
ABO/RH(D): O POS
ANTIBODY SCREEN: NEGATIVE

## 2018-10-21 LAB — URINALYSIS, ROUTINE W REFLEX MICROSCOPIC
Bilirubin Urine: NEGATIVE
Glucose, UA: NEGATIVE mg/dL
Hgb urine dipstick: NEGATIVE
Ketones, ur: NEGATIVE mg/dL
Leukocytes, UA: NEGATIVE
Nitrite: NEGATIVE
PH: 7 (ref 5.0–8.0)
Protein, ur: NEGATIVE mg/dL
Specific Gravity, Urine: 1.009 (ref 1.005–1.030)

## 2018-10-21 LAB — COMPREHENSIVE METABOLIC PANEL
ALT: 18 U/L (ref 0–44)
AST: 21 U/L (ref 15–41)
Albumin: 3.8 g/dL (ref 3.5–5.0)
Alkaline Phosphatase: 66 U/L (ref 38–126)
Anion gap: 8 (ref 5–15)
BUN: 13 mg/dL (ref 8–23)
CO2: 31 mmol/L (ref 22–32)
Calcium: 9.4 mg/dL (ref 8.9–10.3)
Chloride: 100 mmol/L (ref 98–111)
Creatinine, Ser: 0.93 mg/dL (ref 0.44–1.00)
GFR calc non Af Amer: >60 mL/min (ref >60)
Glucose, Bld: 108 mg/dL — ABNORMAL HIGH (ref 70–99)
Potassium: 4.2 mmol/L (ref 3.5–5.1)
Sodium: 139 mmol/L (ref 135–145)
Total Bilirubin: 0.3 mg/dL (ref 0.3–1.2)
Total Protein: 7.2 g/dL (ref 6.5–8.1)

## 2018-10-21 LAB — CBC
HCT: 43.5 % (ref 36.0–46.0)
Hemoglobin: 13.6 g/dL (ref 12.0–15.0)
MCH: 28.8 pg (ref 26.0–34.0)
MCHC: 31.3 g/dL (ref 30.0–36.0)
MCV: 92.2 fL (ref 80.0–100.0)
Platelets: 242 10*3/uL (ref 150–400)
RBC: 4.72 MIL/uL (ref 3.87–5.11)
RDW: 14.2 % (ref 11.5–15.5)
WBC: 7.2 10*3/uL (ref 4.0–10.5)
nRBC: 0 % (ref 0.0–0.2)

## 2018-10-21 LAB — SURGICAL PCR SCREEN
MRSA, PCR: NEGATIVE
Staphylococcus aureus: NEGATIVE

## 2018-10-21 LAB — APTT: aPTT: 35 seconds (ref 24–36)

## 2018-10-21 LAB — ABO/RH: ABO/RH(D): O POS

## 2018-10-21 LAB — PROTIME-INR
INR: 1.01
Prothrombin Time: 13.2 seconds (ref 11.4–15.2)

## 2018-10-21 NOTE — Anesthesia Preprocedure Evaluation (Addendum)
Anesthesia Evaluation  Patient identified by MRN, date of birth, ID band Patient awake    Reviewed: Allergy & Precautions, H&P , NPO status , Patient's Chart, lab work & pertinent test results, reviewed documented beta blocker date and time   Airway Mallampati: II  TM Distance: >3 FB Neck ROM: full    Dental no notable dental hx.    Pulmonary neg pulmonary ROS, Current Smoker,    Pulmonary exam normal breath sounds clear to auscultation       Cardiovascular Exercise Tolerance: Good hypertension, + CAD and + Peripheral Vascular Disease   Rhythm:regular Rate:Normal  EKG: 09/13/18:  Normal sinus rhythm Right atrial enlargement Nonspecific ST and T wave abnormality  CV: Nuclear stress test 10/03/18:  The left ventricular ejection fraction is hyperdynamic (>65%).  Nuclear stress EF: 68%. No wall motion abnormality  There was no ST segment deviation noted during stress.  The study is normal.  This is a low risk study. No ischemia, no infarct.  Echo 10/24/17: Study Conclusions - Left ventricle: The cavity size was normal. Wall thickness was increased in a pattern of severe LVH. There was moderate concentric hypertrophy. Systolic function was vigorous. The estimated ejection fraction was in the range of 65% to 70%. Wall motion was normal; there were no regional wall motion abnormalities. Doppler parameters are consistent with abnormal left ventricular relaxation (grade 1 diastolic dysfunction).  - Aortic valve: Trileaflet. Sclerosis without stenosis. There was trivial regurgitation. - Mitral valve: Mildly thickened leaflets . There was trivial regurgitation. - Left atrium: The atrium was mildly dilated. - Inferior vena cava: The vessel was dilated. The respirophasic diameter changes were blunted (<50%), consistent with elevated central venous pressure. Impressions: - Compared to a prior study in 2015,  there is now moderate to severe LVH with an increased LVEF to 65-70% and grade 1 DD.  Cardiac cath 12/07/06: IMPRESSION: 1. Normal coronary angiography and left ventricular function. 2. Eighty percent right renal artery stenosis.   Neuro/Psych  Headaches, Anxiety Depression Bipolar Disorder CTA head/neck 10/24/17: IMPRESSION: 1. No emergent large vessel occlusion or high-grade intracranial stenosis. 2. Moderate stenosis at the origin of the left vertebral artery, which is otherwise normal. 3. Carotid bifurcation and aortic atherosclerosis (ICD10-I70.0) without hemodynamically significant stenosis. 4. Emphysema     Neuromuscular disease CVA negative psych ROS   GI/Hepatic Neg liver ROS, GERD  ,  Endo/Other  Hypothyroidism   Renal/GU negative Renal ROS  negative genitourinary   Musculoskeletal  (+) Arthritis , Osteoarthritis,    Abdominal   Peds  Hematology  (+) Blood dyscrasia, anemia ,   Anesthesia Other Findings   Reproductive/Obstetrics negative OB ROS                            Anesthesia Physical Anesthesia Plan  ASA: III  Anesthesia Plan: General   Post-op Pain Management:    Induction:   PONV Risk Score and Plan: 3 and Treatment may vary due to age or medical condition, Ondansetron and Dexamethasone  Airway Management Planned: Oral ETT and LMA  Additional Equipment:   Intra-op Plan:   Post-operative Plan:   Informed Consent: I have reviewed the patients History and Physical, chart, labs and discussed the procedure including the risks, benefits and alternatives for the proposed anesthesia with the patient or authorized representative who has indicated his/her understanding and acceptance.     Dental Advisory Given  Plan Discussed with: CRNA, Anesthesiologist and Surgeon  Anesthesia Plan  Comments: (PAT note written 10/21/2018 by Myra Gianotti, PA-C. )       Anesthesia Quick Evaluation

## 2018-10-21 NOTE — Progress Notes (Signed)
Anesthesia Chart Review:  Case:  [id number] Date/Time:  10/22/18 0715   Procedure:  LEFT FEMORAL TO BELOW THE KNEE POPLITEAL ARTERY BYPASS GRAFT (Left )   Anesthesia type:  General   Pre-op diagnosis:  PERIPHERAL VASCULAR DISEASE   Location:  [facility] OR ROOM 10 / [place] [facility]   Surgeon:  Elam Dutch, MD      DISCUSSION: Patient is a 73 year old female scheduled for the above procedure.  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   She has had intermittent evaluations by cardiology over the years due to chest pain. She had a normal coronaries by [facility] in 2008. She had chest pain post 09/13/18 aortogram which was thought to have a high anxiety component. Her troponin was < 0.03. She was referred for out-patient cardiology evaluation and seen by Kerin Ransom, PA-C on 09/26/18. A nuclear stress test was ordered which was non-ischemic, so she was felt "OK for surgery." She is to continue ASA per [facility].  Preoperative labs acceptable. If no acute changes then I anticipate that she can proceed as planned.   VS: BP (!) 168/80 Comment: pt states that she has NOT taken her BP meds/taken manually notified Esther RN  Pulse 67   Temp 36.5 C   Resp 18   Ht 5\' 8"  (1.727 m)   Wt 72.8 kg   SpO2 100%   BMI 24.40 kg/m    PROVIDERS: [name], MD is her PCP - Neurologist is with [facility] with visit with Kathrynn Ducking, MD and Rosalin Hawking, MD. Cardiologist is Quay Burow, MD. She is not seen routinely. Last seen for pre-operative evaluation 09/26/18 as above.    LABS: Labs reviewed: Acceptable for  surgery. (all labs ordered are listed, but only abnormal results are displayed)  Labs Reviewed  COMPREHENSIVE METABOLIC PANEL - Abnormal; Notable for the following components:      Result Value   Glucose, Bld 108 (*)    All other components within normal limits  SURGICAL PCR SCREEN  APTT  CBC  PROTIME-INR  URINALYSIS, ROUTINE W REFLEX MICROSCOPIC  TYPE AND SCREEN    IMAGES: CXR 07/08/18: IMPRESSION: Chronic interstitial changes without focal consolidation. Aortic Atherosclerosis (ICD10-I70.0).  CTA head/neck 10/24/17: IMPRESSION: 1. No emergent large vessel occlusion or high-grade intracranial stenosis. 2. Moderate stenosis at the origin of the left vertebral artery, which is otherwise normal. 3. Carotid bifurcation and aortic atherosclerosis (ICD10-I70.0) without hemodynamically significant stenosis. 4.  Emphysema (ICD10-J43.9).   EKG: 09/13/18:  Normal sinus rhythm Right atrial enlargement Nonspecific ST and T wave abnormality Abnormal ECG ST changes are new Confirmed by Shelva Majestic [phone]) on 09/13/2018 1:09:27 PM   CV: Nuclear stress test 10/03/18:  The left ventricular ejection fraction is hyperdynamic (>65%).  Nuclear stress EF: 68%. No wall motion abnormality  There was no ST segment deviation noted during stress.  The study is normal.  This is a low risk study. No ischemia, no infarct.   Abdominal aortogram with BLE runoff 09/13/18: Operative findings:  1.  Chronic left popliteal occlusion with reconstitution of the below-knee popliteal artery one-vessel peroneal runoff 2.  Inline flow atherosclerotic change right leg with two-vessel runoff peroneal posterior tibial  3.  No significant aortoiliac occlusive disease 4.  Possible small abdominal aortic aneurysm which correlates with 3 cm abdominal aortic aneurysm seen on CT scan November 2019  Echo 10/24/17: Study Conclusions - Left ventricle: The cavity size was normal. Wall thickness was   increased in  a pattern of severe LVH. There was moderate   concentric hypertrophy. Systolic function was vigorous. The   estimated ejection fraction was in the range of 65% to 70%. Wall   motion was normal; there were no regional wall motion   abnormalities. Doppler parameters are consistent with abnormal   left ventricular relaxation (grade 1 diastolic dysfunction). The   E/e&' ratio is between 8-15, suggesting indeterminate LV filling   pressure. - Aortic valve: Trileaflet. Sclerosis without stenosis. There was   trivial regurgitation. - Mitral valve: Mildly thickened leaflets . There was trivial   regurgitation. - Left atrium: The atrium was mildly dilated. - Inferior vena cava: The vessel was dilated. The respirophasic   diameter changes were blunted (< 50%), consistent with elevated   central venous pressure. Impressions: - Compared to a prior study in 2015, there is now moderate to   severe LVH with an increased LVEF to 65-70% and grade 1 DD.  Cardiac cath 12/07/06:  IMPRESSION:  1. Normal coronary angiography and left ventricular function.  2. Eighty percent right renal artery stenosis.   Past Medical History:  Diagnosis Date  . AAA (abdominal aortic aneurysm) (HCC)    3.1 cm 07/08/18, 3 year follow-up recommended; possible 3 cm AAA by aortogram 09/13/18  . Anemia    PMH  . Appendicitis with abscess    07/08/18, s/p perc drain; resolved 07/30/18 by CT  . Arthritis   . Bipolar disorder ([place])   . Cerebrovascular disease    intra and extracranial vascular dx per MRI 4/11, neurology rec strict CVRF control  . Colonic inertia   . Constipation    chronic;severe  . Coronary artery disease   . Depression   . Duodenitis   . EKG abnormalities    changes, stress test neg (false EKG changes)  . Gastritis   . GERD (gastroesophageal reflux disease)   . Hypertension   . Hypothyroid 01/16/2014  . Meningioma ([place]) 10/21/2013  . PAD (peripheral artery disease) ([place])   . Psoriasis    sees derm   . Stroke ([place])   . Wears dentures   . Wears glasses     Past Surgical History:  Procedure Laterality Date  . ABDOMINAL AORTOGRAM W/LOWER EXTREMITY N/A 09/13/2018   Procedure: ABDOMINAL AORTOGRAM W/LOWER EXTREMITY;  Surgeon: Elam Dutch, MD;  Location: [facility];  Service: Cardiovascular;  Laterality: N/A;  . arthroscopy  04/2010   Right knee  . CATARACT EXTRACTION W/ INTRAOCULAR LENS  IMPLANT, BILATERAL    . COLONOSCOPY    . IR RADIOLOGIST EVAL & MGMT  07/30/2018  . MULTIPLE TOOTH EXTRACTIONS    . TUBAL LIGATION      MEDICATIONS: . amLODipine (NORVASC) 10 MG tablet  . aspirin EC 325 MG EC tablet  . aspirin EC 81 MG tablet  . atorvastatin (LIPITOR) 40 MG tablet  . bisacodyl (DULCOLAX) 5 MG EC tablet  . clonazePAM (KLONOPIN) 0.5 MG tablet  . DULoxetine (CYMBALTA) 30 MG capsule  . DULoxetine (CYMBALTA) 60 MG capsule  . famotidine (PEPCID) 20 MG tablet  . lamoTRIgine (LAMICTAL) 150 MG tablet  . linaclotide (LINZESS) 145 MCG CAPS capsule  . losartan (COZAAR) 50 MG tablet  . magnesium  citrate SOLN  . meclizine (ANTIVERT) 12.5 MG tablet  . mupirocin ointment (BACTROBAN) 2 %  . polyethylene glycol powder (GLYCOLAX/MIRALAX) powder  . rOPINIRole (REQUIP) 2 MG tablet  . Sennosides 25 MG TABS   No current facility-administered medications for this encounter.     Myra Gianotti, PA-C Surgical Short Stay/Anesthesiology [facility] Phone [phone] [facility] Phone [phone] 10/21/2018 4:18 PM

## 2018-10-21 NOTE — Progress Notes (Signed)
Pt denies SOB and chest pain. Pt stated that she is under the care of Dr. Gwenlyn Found, Cardiology. Pt stated that a cardiac cath was performed > 20 years ago. Pt stated that Dr. Carlota Raspberry is her PCP. Pt denies recent labs. Pt verbalized understanding of all pre-op instructions. See PA, Anesthesiology, note.

## 2018-10-22 ENCOUNTER — Inpatient Hospital Stay (HOSPITAL_COMMUNITY)
Admission: RE | Admit: 2018-10-22 | Discharge: 2018-10-24 | DRG: 253 | Disposition: A | Discharge status: Rehabilitation Facility | Payer: Medicare Other | Source: Ambulatory Visit | Attending: Vascular Surgery | Admitting: Vascular Surgery

## 2018-10-22 ENCOUNTER — Encounter (HOSPITAL_COMMUNITY): Payer: Self-pay

## 2018-10-22 ENCOUNTER — Encounter (HOSPITAL_COMMUNITY)
Admission: RE | Disposition: A | Discharge status: Rehabilitation Facility | Payer: Self-pay | Source: Ambulatory Visit | Attending: Vascular Surgery

## 2018-10-22 ENCOUNTER — Inpatient Hospital Stay (HOSPITAL_COMMUNITY): Payer: Medicare Other | Admitting: Certified Registered Nurse Anesthetist

## 2018-10-22 ENCOUNTER — Inpatient Hospital Stay (HOSPITAL_COMMUNITY): Payer: Medicare Other | Admitting: Vascular Surgery

## 2018-10-22 DIAGNOSIS — Z79899 Other long term (current) drug therapy: Secondary | ICD-10-CM | POA: Diagnosis not present

## 2018-10-22 DIAGNOSIS — I70222 Atherosclerosis of native arteries of extremities with rest pain, left leg: Secondary | ICD-10-CM | POA: Diagnosis not present

## 2018-10-22 DIAGNOSIS — K5900 Constipation, unspecified: Secondary | ICD-10-CM | POA: Diagnosis not present

## 2018-10-22 DIAGNOSIS — Z811 Family history of alcohol abuse and dependence: Secondary | ICD-10-CM | POA: Diagnosis not present

## 2018-10-22 DIAGNOSIS — E039 Hypothyroidism, unspecified: Secondary | ICD-10-CM | POA: Diagnosis not present

## 2018-10-22 DIAGNOSIS — Z8249 Family history of ischemic heart disease and other diseases of the circulatory system: Secondary | ICD-10-CM | POA: Diagnosis not present

## 2018-10-22 DIAGNOSIS — I2510 Atherosclerotic heart disease of native coronary artery without angina pectoris: Secondary | ICD-10-CM | POA: Diagnosis not present

## 2018-10-22 DIAGNOSIS — I7090 Unspecified atherosclerosis: Secondary | ICD-10-CM | POA: Diagnosis not present

## 2018-10-22 DIAGNOSIS — I10 Essential (primary) hypertension: Secondary | ICD-10-CM | POA: Diagnosis not present

## 2018-10-22 DIAGNOSIS — F17210 Nicotine dependence, cigarettes, uncomplicated: Secondary | ICD-10-CM | POA: Diagnosis not present

## 2018-10-22 DIAGNOSIS — I701 Atherosclerosis of renal artery: Secondary | ICD-10-CM | POA: Diagnosis present

## 2018-10-22 DIAGNOSIS — L409 Psoriasis, unspecified: Secondary | ICD-10-CM | POA: Diagnosis present

## 2018-10-22 DIAGNOSIS — E785 Hyperlipidemia, unspecified: Secondary | ICD-10-CM | POA: Diagnosis present

## 2018-10-22 DIAGNOSIS — G479 Sleep disorder, unspecified: Secondary | ICD-10-CM | POA: Diagnosis not present

## 2018-10-22 DIAGNOSIS — J439 Emphysema, unspecified: Secondary | ICD-10-CM | POA: Diagnosis present

## 2018-10-22 DIAGNOSIS — G8918 Other acute postprocedural pain: Secondary | ICD-10-CM | POA: Diagnosis not present

## 2018-10-22 DIAGNOSIS — I714 Abdominal aortic aneurysm, without rupture: Secondary | ICD-10-CM | POA: Diagnosis present

## 2018-10-22 DIAGNOSIS — Z9889 Other specified postprocedural states: Secondary | ICD-10-CM | POA: Diagnosis not present

## 2018-10-22 DIAGNOSIS — I739 Peripheral vascular disease, unspecified: Secondary | ICD-10-CM | POA: Diagnosis not present

## 2018-10-22 DIAGNOSIS — Z8673 Personal history of transient ischemic attack (TIA), and cerebral infarction without residual deficits: Secondary | ICD-10-CM

## 2018-10-22 DIAGNOSIS — K219 Gastro-esophageal reflux disease without esophagitis: Secondary | ICD-10-CM | POA: Diagnosis present

## 2018-10-22 DIAGNOSIS — D62 Acute posthemorrhagic anemia: Secondary | ICD-10-CM | POA: Diagnosis not present

## 2018-10-22 DIAGNOSIS — Z801 Family history of malignant neoplasm of trachea, bronchus and lung: Secondary | ICD-10-CM | POA: Diagnosis not present

## 2018-10-22 DIAGNOSIS — Z720 Tobacco use: Secondary | ICD-10-CM | POA: Diagnosis not present

## 2018-10-22 DIAGNOSIS — Z833 Family history of diabetes mellitus: Secondary | ICD-10-CM | POA: Diagnosis not present

## 2018-10-22 DIAGNOSIS — I70209 Unspecified atherosclerosis of native arteries of extremities, unspecified extremity: Secondary | ICD-10-CM | POA: Diagnosis present

## 2018-10-22 DIAGNOSIS — E46 Unspecified protein-calorie malnutrition: Secondary | ICD-10-CM | POA: Diagnosis not present

## 2018-10-22 DIAGNOSIS — F319 Bipolar disorder, unspecified: Secondary | ICD-10-CM | POA: Diagnosis not present

## 2018-10-22 DIAGNOSIS — Z7982 Long term (current) use of aspirin: Secondary | ICD-10-CM

## 2018-10-22 DIAGNOSIS — Z23 Encounter for immunization: Secondary | ICD-10-CM | POA: Diagnosis not present

## 2018-10-22 DIAGNOSIS — G4700 Insomnia, unspecified: Secondary | ICD-10-CM | POA: Diagnosis not present

## 2018-10-22 DIAGNOSIS — Z803 Family history of malignant neoplasm of breast: Secondary | ICD-10-CM | POA: Diagnosis not present

## 2018-10-22 DIAGNOSIS — R269 Unspecified abnormalities of gait and mobility: Secondary | ICD-10-CM | POA: Diagnosis not present

## 2018-10-22 DIAGNOSIS — I70248 Atherosclerosis of native arteries of left leg with ulceration of other part of lower left leg: Secondary | ICD-10-CM | POA: Diagnosis not present

## 2018-10-22 DIAGNOSIS — R5381 Other malaise: Secondary | ICD-10-CM | POA: Diagnosis present

## 2018-10-22 HISTORY — PX: FEMORAL-POPLITEAL BYPASS GRAFT: SHX937

## 2018-10-22 SURGERY — BYPASS GRAFT FEMORAL-POPLITEAL ARTERY
Anesthesia: General | Site: Leg Lower | Laterality: Left

## 2018-10-22 MED ORDER — ALUM & MAG HYDROXIDE-SIMETH 200-200-20 MG/5ML PO SUSP: 15.0000 mL | ORAL | Status: DC | PRN

## 2018-10-22 MED ORDER — GUAIFENESIN-DM 100-10 MG/5ML PO SYRP: 15.0000 mL | ORAL_SOLUTION | ORAL | Status: DC | PRN

## 2018-10-22 MED ORDER — ONDANSETRON HCL 4 MG/2ML IJ SOLN
INTRAMUSCULAR | Status: AC
  Filled 2018-10-22: qty 2

## 2018-10-22 MED ORDER — PROPOFOL 10 MG/ML IV BOLUS
INTRAVENOUS | Status: DC | PRN
  Administered 2018-10-22: 20 mg via INTRAVENOUS
  Administered 2018-10-22: 130 mg via INTRAVENOUS
  Administered 2018-10-22 (×2): 20 mg via INTRAVENOUS

## 2018-10-22 MED ORDER — LABETALOL HCL 5 MG/ML IV SOLN
INTRAVENOUS | Status: AC
  Filled 2018-10-22: qty 4

## 2018-10-22 MED ORDER — LACTATED RINGERS IV SOLN
INTRAVENOUS | Status: DC | PRN
  Administered 2018-10-22 (×2): via INTRAVENOUS

## 2018-10-22 MED ORDER — ENOXAPARIN SODIUM 30 MG/0.3ML ~~LOC~~ SOLN
30.0000 mg | SUBCUTANEOUS | Status: DC
  Administered 2018-10-23 – 2018-10-24 (×2): 30 mg via SUBCUTANEOUS
  Filled 2018-10-22 (×2): qty 0.3

## 2018-10-22 MED ORDER — 0.9 % SODIUM CHLORIDE (POUR BTL) OPTIME
TOPICAL | Status: DC | PRN
  Administered 2018-10-22 (×2): 1000 mL

## 2018-10-22 MED ORDER — SODIUM CHLORIDE 0.9 % IV SOLN
INTRAVENOUS | Status: DC
  Administered 2018-10-22: via INTRAVENOUS

## 2018-10-22 MED ORDER — ONDANSETRON HCL 4 MG/2ML IJ SOLN: 4.0000 mg | Freq: Once | INTRAMUSCULAR | Status: DC | PRN

## 2018-10-22 MED ORDER — LINACLOTIDE 145 MCG PO CAPS
145.0000 ug | ORAL_CAPSULE | Freq: Every day | ORAL | Status: DC
  Administered 2018-10-22 – 2018-10-23 (×2): 145 ug via ORAL
  Filled 2018-10-22 (×2): qty 1

## 2018-10-22 MED ORDER — MAGNESIUM SULFATE 2 GM/50ML IV SOLN: 2.0000 g | Freq: Every day | INTRAVENOUS | Status: DC | PRN

## 2018-10-22 MED ORDER — CLONAZEPAM 0.5 MG PO TABS
0.5000 mg | ORAL_TABLET | Freq: Every day | ORAL | Status: DC
  Administered 2018-10-22 – 2018-10-23 (×2): 0.5 mg via ORAL
  Filled 2018-10-22 (×2): qty 1

## 2018-10-22 MED ORDER — ACETAMINOPHEN 325 MG RE SUPP: 325.0000 mg | RECTAL | Status: DC | PRN

## 2018-10-22 MED ORDER — CEFAZOLIN SODIUM-DEXTROSE 2-4 GM/100ML-% IV SOLN
2.0000 g | INTRAVENOUS | Status: AC
  Administered 2018-10-22: 2 g via INTRAVENOUS
  Filled 2018-10-22: qty 100

## 2018-10-22 MED ORDER — MUPIROCIN 2 % EX OINT: 1.0000 "application " | TOPICAL_OINTMENT | Freq: Two times a day (BID) | CUTANEOUS | Status: DC | PRN

## 2018-10-22 MED ORDER — PROPOFOL 10 MG/ML IV BOLUS
INTRAVENOUS | Status: AC
  Filled 2018-10-22: qty 20

## 2018-10-22 MED ORDER — CEFAZOLIN SODIUM-DEXTROSE 2-4 GM/100ML-% IV SOLN
2.0000 g | Freq: Three times a day (TID) | INTRAVENOUS | Status: AC
  Administered 2018-10-22 (×2): 2 g via INTRAVENOUS
  Filled 2018-10-22 (×3): qty 100

## 2018-10-22 MED ORDER — ALBUMIN HUMAN 5 % IV SOLN
INTRAVENOUS | Status: DC | PRN
  Administered 2018-10-22: 11:00:00 via INTRAVENOUS

## 2018-10-22 MED ORDER — SODIUM CHLORIDE 0.9 % IV SOLN
INTRAVENOUS | Status: DC | PRN
  Administered 2018-10-22: 15 ug/min via INTRAVENOUS

## 2018-10-22 MED ORDER — CHLORHEXIDINE GLUCONATE 4 % EX LIQD: 60.0000 mL | Freq: Once | CUTANEOUS | Status: DC

## 2018-10-22 MED ORDER — FENTANYL CITRATE (PF) 250 MCG/5ML IJ SOLN
INTRAMUSCULAR | Status: AC
  Filled 2018-10-22: qty 5

## 2018-10-22 MED ORDER — ACETAMINOPHEN 325 MG PO TABS: 325.0000 mg | ORAL_TABLET | ORAL | Status: DC | PRN

## 2018-10-22 MED ORDER — FENTANYL CITRATE (PF) 250 MCG/5ML IJ SOLN
INTRAMUSCULAR | Status: DC | PRN
  Administered 2018-10-22 (×2): 50 ug via INTRAVENOUS
  Administered 2018-10-22: 25 ug via INTRAVENOUS
  Administered 2018-10-22: 100 ug via INTRAVENOUS
  Administered 2018-10-22 (×2): 50 ug via INTRAVENOUS
  Administered 2018-10-22: 25 ug via INTRAVENOUS
  Administered 2018-10-22: 50 ug via INTRAVENOUS

## 2018-10-22 MED ORDER — POTASSIUM CHLORIDE CRYS ER 20 MEQ PO TBCR: 20.0000 meq | EXTENDED_RELEASE_TABLET | Freq: Every day | ORAL | Status: DC | PRN

## 2018-10-22 MED ORDER — SENNA 8.6 MG PO TABS
17.2000 mg | ORAL_TABLET | ORAL | Status: DC
  Administered 2018-10-23: 17.2 mg via ORAL
  Filled 2018-10-22: qty 2

## 2018-10-22 MED ORDER — HEPARIN SODIUM (PORCINE) 1000 UNIT/ML IJ SOLN
INTRAMUSCULAR | Status: DC | PRN
  Administered 2018-10-22: 8000 [IU] via INTRAVENOUS

## 2018-10-22 MED ORDER — ACETAMINOPHEN 160 MG/5ML PO SOLN: 325.0000 mg | ORAL | Status: DC | PRN

## 2018-10-22 MED ORDER — METOPROLOL TARTRATE 5 MG/5ML IV SOLN: 2.0000 mg | INTRAVENOUS | Status: DC | PRN

## 2018-10-22 MED ORDER — FENTANYL CITRATE (PF) 100 MCG/2ML IJ SOLN
25.0000 ug | INTRAMUSCULAR | Status: DC | PRN
  Administered 2018-10-22: 25 ug via INTRAVENOUS

## 2018-10-22 MED ORDER — SODIUM CHLORIDE 0.9 % IV SOLN
INTRAVENOUS | Status: DC | PRN
  Administered 2018-10-22: 500 mL

## 2018-10-22 MED ORDER — PHENYLEPHRINE 40 MCG/ML (10ML) SYRINGE FOR IV PUSH (FOR BLOOD PRESSURE SUPPORT)
PREFILLED_SYRINGE | INTRAVENOUS | Status: AC
  Filled 2018-10-22: qty 10

## 2018-10-22 MED ORDER — DULOXETINE HCL 60 MG PO CPEP
60.0000 mg | ORAL_CAPSULE | Freq: Every day | ORAL | Status: DC
  Administered 2018-10-23 – 2018-10-24 (×2): 60 mg via ORAL
  Filled 2018-10-22 (×2): qty 1

## 2018-10-22 MED ORDER — EPHEDRINE SULFATE-NACL 50-0.9 MG/10ML-% IV SOSY
PREFILLED_SYRINGE | INTRAVENOUS | Status: DC | PRN
  Administered 2018-10-22 (×8): 5 mg via INTRAVENOUS

## 2018-10-22 MED ORDER — BISACODYL 5 MG PO TBEC: 5.0000 mg | DELAYED_RELEASE_TABLET | Freq: Every day | ORAL | Status: DC | PRN

## 2018-10-22 MED ORDER — ASPIRIN EC 81 MG PO TBEC
81.0000 mg | DELAYED_RELEASE_TABLET | Freq: Every day | ORAL | Status: DC
  Administered 2018-10-23 – 2018-10-24 (×2): 81 mg via ORAL
  Filled 2018-10-22 (×2): qty 1

## 2018-10-22 MED ORDER — BISACODYL 10 MG RE SUPP: 10.0000 mg | Freq: Every day | RECTAL | Status: DC | PRN

## 2018-10-22 MED ORDER — SUCCINYLCHOLINE CHLORIDE 200 MG/10ML IV SOSY
PREFILLED_SYRINGE | INTRAVENOUS | Status: AC
  Filled 2018-10-22: qty 10

## 2018-10-22 MED ORDER — SODIUM CHLORIDE 0.9 % IV SOLN: 500.0000 mL | Freq: Once | INTRAVENOUS | Status: DC | PRN

## 2018-10-22 MED ORDER — DEXAMETHASONE SODIUM PHOSPHATE 10 MG/ML IJ SOLN
INTRAMUSCULAR | Status: DC | PRN
  Administered 2018-10-22: 4 mg via INTRAVENOUS

## 2018-10-22 MED ORDER — ROCURONIUM BROMIDE 10 MG/ML (PF) SYRINGE
PREFILLED_SYRINGE | INTRAVENOUS | Status: DC | PRN
  Administered 2018-10-22: 30 mg via INTRAVENOUS
  Administered 2018-10-22: 10 mg via INTRAVENOUS
  Administered 2018-10-22: 20 mg via INTRAVENOUS
  Administered 2018-10-22: 50 mg via INTRAVENOUS
  Administered 2018-10-22: 10 mg via INTRAVENOUS
  Administered 2018-10-22: 20 mg via INTRAVENOUS
  Administered 2018-10-22: 30 mg via INTRAVENOUS
  Administered 2018-10-22: 10 mg via INTRAVENOUS

## 2018-10-22 MED ORDER — POLYETHYLENE GLYCOL 3350 17 G PO PACK
17.0000 g | PACK | ORAL | Status: DC
  Administered 2018-10-23: 17 g via ORAL
  Filled 2018-10-22 (×2): qty 1

## 2018-10-22 MED ORDER — SUGAMMADEX SODIUM 200 MG/2ML IV SOLN
INTRAVENOUS | Status: DC | PRN
  Administered 2018-10-22: 200 mg via INTRAVENOUS

## 2018-10-22 MED ORDER — LIDOCAINE 2% (20 MG/ML) 5 ML SYRINGE
INTRAMUSCULAR | Status: AC
  Filled 2018-10-22: qty 15

## 2018-10-22 MED ORDER — DOCUSATE SODIUM 100 MG PO CAPS
100.0000 mg | ORAL_CAPSULE | Freq: Every day | ORAL | Status: DC
  Administered 2018-10-23 – 2018-10-24 (×2): 100 mg via ORAL
  Filled 2018-10-22 (×2): qty 1

## 2018-10-22 MED ORDER — OXYCODONE HCL 5 MG PO TABS: 5.0000 mg | ORAL_TABLET | Freq: Once | ORAL | Status: DC | PRN

## 2018-10-22 MED ORDER — ONDANSETRON HCL 4 MG/2ML IJ SOLN
INTRAMUSCULAR | Status: DC | PRN
  Administered 2018-10-22: 4 mg via INTRAVENOUS

## 2018-10-22 MED ORDER — MORPHINE SULFATE (PF) 2 MG/ML IV SOLN
2.0000 mg | INTRAVENOUS | Status: DC | PRN
  Administered 2018-10-23 (×2): 2 mg via INTRAVENOUS
  Filled 2018-10-22 (×4): qty 1

## 2018-10-22 MED ORDER — SODIUM CHLORIDE 0.9 % IV SOLN
INTRAVENOUS | Status: AC
  Filled 2018-10-22: qty 1.2

## 2018-10-22 MED ORDER — PHENOL 1.4 % MT LIQD: 1.0000 | OROMUCOSAL | Status: DC | PRN

## 2018-10-22 MED ORDER — DULOXETINE HCL 30 MG PO CPEP: 30.0000 mg | ORAL_CAPSULE | Freq: Every evening | ORAL | Status: DC

## 2018-10-22 MED ORDER — HYDRALAZINE HCL 20 MG/ML IJ SOLN: 5.0000 mg | INTRAMUSCULAR | Status: DC | PRN

## 2018-10-22 MED ORDER — OXYCODONE HCL 5 MG/5ML PO SOLN: 5.0000 mg | Freq: Once | ORAL | Status: DC | PRN

## 2018-10-22 MED ORDER — LIDOCAINE 2% (20 MG/ML) 5 ML SYRINGE
INTRAMUSCULAR | Status: DC | PRN
  Administered 2018-10-22: 80 mg via INTRAVENOUS
  Administered 2018-10-22: 20 mg via INTRAVENOUS

## 2018-10-22 MED ORDER — ROCURONIUM BROMIDE 50 MG/5ML IV SOSY
PREFILLED_SYRINGE | INTRAVENOUS | Status: AC
  Filled 2018-10-22: qty 5

## 2018-10-22 MED ORDER — LABETALOL HCL 5 MG/ML IV SOLN
INTRAVENOUS | Status: DC | PRN
  Administered 2018-10-22: 2.5 mg via INTRAVENOUS

## 2018-10-22 MED ORDER — PROTAMINE SULFATE 10 MG/ML IV SOLN
INTRAVENOUS | Status: DC | PRN
  Administered 2018-10-22: 50 mg via INTRAVENOUS

## 2018-10-22 MED ORDER — OXYCODONE-ACETAMINOPHEN 5-325 MG PO TABS
1.0000 | ORAL_TABLET | ORAL | Status: DC | PRN
  Administered 2018-10-23 – 2018-10-24 (×4): 2 via ORAL
  Administered 2018-10-24: 1 via ORAL
  Administered 2018-10-24: 2 via ORAL
  Filled 2018-10-22: qty 2
  Filled 2018-10-22: qty 1
  Filled 2018-10-22 (×4): qty 2

## 2018-10-22 MED ORDER — SODIUM CHLORIDE 0.9 % IV SOLN: INTRAVENOUS | Status: DC

## 2018-10-22 MED ORDER — LABETALOL HCL 5 MG/ML IV SOLN: 10.0000 mg | INTRAVENOUS | Status: DC | PRN

## 2018-10-22 MED ORDER — MEPERIDINE HCL 50 MG/ML IJ SOLN: 6.2500 mg | INTRAMUSCULAR | Status: DC | PRN

## 2018-10-22 MED ORDER — LAMOTRIGINE 25 MG PO TABS
150.0000 mg | ORAL_TABLET | Freq: Every day | ORAL | Status: DC
  Administered 2018-10-23 – 2018-10-24 (×2): 150 mg via ORAL
  Filled 2018-10-22 (×2): qty 6

## 2018-10-22 MED ORDER — EPHEDRINE 5 MG/ML INJ
INTRAVENOUS | Status: AC
  Filled 2018-10-22: qty 10

## 2018-10-22 MED ORDER — ROPINIROLE HCL 1 MG PO TABS
2.0000 mg | ORAL_TABLET | Freq: Every day | ORAL | Status: DC
  Administered 2018-10-22 – 2018-10-23 (×2): 2 mg via ORAL
  Filled 2018-10-22 (×2): qty 2

## 2018-10-22 MED ORDER — DEXAMETHASONE SODIUM PHOSPHATE 10 MG/ML IJ SOLN
INTRAMUSCULAR | Status: AC
  Filled 2018-10-22: qty 1

## 2018-10-22 MED ORDER — PANTOPRAZOLE SODIUM 40 MG PO TBEC
40.0000 mg | DELAYED_RELEASE_TABLET | Freq: Every day | ORAL | Status: DC
  Administered 2018-10-22 – 2018-10-24 (×3): 40 mg via ORAL
  Filled 2018-10-22 (×3): qty 1

## 2018-10-22 MED ORDER — PAPAVERINE HCL 30 MG/ML IJ SOLN
INTRAMUSCULAR | Status: AC
  Filled 2018-10-22: qty 2

## 2018-10-22 MED ORDER — FENTANYL CITRATE (PF) 100 MCG/2ML IJ SOLN
INTRAMUSCULAR | Status: AC
  Filled 2018-10-22: qty 2

## 2018-10-22 MED ORDER — ONDANSETRON HCL 4 MG/2ML IJ SOLN: 4.0000 mg | Freq: Four times a day (QID) | INTRAMUSCULAR | Status: DC | PRN

## 2018-10-22 SURGICAL SUPPLY — 57 items
BANDAGE ESMARK 6X9 LF (GAUZE/BANDAGES/DRESSINGS) IMPLANT
BNDG ESMARK 6X9 LF (GAUZE/BANDAGES/DRESSINGS)
CANISTER SUCT 3000ML PPV (MISCELLANEOUS) ×2 IMPLANT
CANNULA VESSEL 3MM 2 BLNT TIP (CANNULA) ×4 IMPLANT
CLIP VESOCCLUDE MED 24/CT (CLIP) ×2 IMPLANT
CLIP VESOCCLUDE SM WIDE 24/CT (CLIP) ×2 IMPLANT
COVER WAND RF STERILE (DRAPES) ×2 IMPLANT
CUFF TOURNIQUET SINGLE 24IN (TOURNIQUET CUFF) IMPLANT
CUFF TOURNIQUET SINGLE 34IN LL (TOURNIQUET CUFF) IMPLANT
CUFF TOURNIQUET SINGLE 44IN (TOURNIQUET CUFF) IMPLANT
DERMABOND ADVANCED (GAUZE/BANDAGES/DRESSINGS) ×3
DERMABOND ADVANCED .7 DNX12 (GAUZE/BANDAGES/DRESSINGS) ×3 IMPLANT
DRAIN SNY WOU (WOUND CARE) IMPLANT
DRAPE HALF SHEET 40X57 (DRAPES) IMPLANT
DRAPE X-RAY CASS 24X20 (DRAPES) IMPLANT
ELECT REM PT RETURN 9FT ADLT (ELECTROSURGICAL) ×2
ELECTRODE REM PT RTRN 9FT ADLT (ELECTROSURGICAL) ×1 IMPLANT
EVACUATOR SILICONE 100CC (DRAIN) IMPLANT
GLOVE BIO SURGEON STRL SZ 6.5 (GLOVE) ×4 IMPLANT
GLOVE BIO SURGEON STRL SZ7.5 (GLOVE) ×2 IMPLANT
GLOVE BIOGEL PI IND STRL 6.5 (GLOVE) ×1 IMPLANT
GLOVE BIOGEL PI IND STRL 7.0 (GLOVE) ×2 IMPLANT
GLOVE BIOGEL PI INDICATOR 6.5 (GLOVE) ×1
GLOVE BIOGEL PI INDICATOR 7.0 (GLOVE) ×2
GLOVE SURG SS PI 6.5 STRL IVOR (GLOVE) ×4 IMPLANT
GLOVE SURG SS PI 7.0 STRL IVOR (GLOVE) ×2 IMPLANT
GOWN STRL REUS W/ TWL LRG LVL3 (GOWN DISPOSABLE) ×4 IMPLANT
GOWN STRL REUS W/TWL LRG LVL3 (GOWN DISPOSABLE) ×4
HEMOSTAT SPONGE AVITENE ULTRA (HEMOSTASIS) IMPLANT
KIT BASIN OR (CUSTOM PROCEDURE TRAY) ×2 IMPLANT
KIT TURNOVER KIT B (KITS) ×2 IMPLANT
LOOP VESSEL MAXI BLUE (MISCELLANEOUS) ×2 IMPLANT
MARKER GRAFT CORONARY BYPASS (MISCELLANEOUS) ×2 IMPLANT
NS IRRIG 1000ML POUR BTL (IV SOLUTION) ×4 IMPLANT
PACK PERIPHERAL VASCULAR (CUSTOM PROCEDURE TRAY) ×2 IMPLANT
PAD ARMBOARD 7.5X6 YLW CONV (MISCELLANEOUS) ×4 IMPLANT
SET COLLECT BLD 21X3/4 12 (NEEDLE) IMPLANT
STOPCOCK 4 WAY LG BORE MALE ST (IV SETS) IMPLANT
SUT PROLENE 5 0 C 1 24 (SUTURE) IMPLANT
SUT PROLENE 6 0 CC (SUTURE) ×14 IMPLANT
SUT PROLENE 7 0 BV 1 (SUTURE) ×2 IMPLANT
SUT PROLENE 7 0 BV1 MDA (SUTURE) IMPLANT
SUT SILK 2 0 SH (SUTURE) ×2 IMPLANT
SUT SILK 3 0 (SUTURE) ×3
SUT SILK 3-0 18XBRD TIE 12 (SUTURE) ×3 IMPLANT
SUT VIC AB 2-0 SH 27 (SUTURE) ×1
SUT VIC AB 2-0 SH 27XBRD (SUTURE) ×1 IMPLANT
SUT VIC AB 3-0 SH 27 (SUTURE) ×8
SUT VIC AB 3-0 SH 27X BRD (SUTURE) ×8 IMPLANT
SUT VIC AB 4-0 PS2 18 (SUTURE) ×10 IMPLANT
SUT VIC AB 4-0 PS2 27 (SUTURE) IMPLANT
TAPE UMBILICAL COTTON 1/8X30 (MISCELLANEOUS) ×2 IMPLANT
TOWEL GREEN STERILE (TOWEL DISPOSABLE) ×2 IMPLANT
TRAY FOLEY MTR SLVR 16FR STAT (SET/KITS/TRAYS/PACK) ×2 IMPLANT
TUBING EXTENTION W/L.L. (IV SETS) IMPLANT
UNDERPAD 30X30 (UNDERPADS AND DIAPERS) ×2 IMPLANT
WATER STERILE IRR 1000ML POUR (IV SOLUTION) ×2 IMPLANT

## 2018-10-22 NOTE — H&P (Signed)
Patient is a 73 year old female who returns for follow-up today.  She was last seen March 15, 2018.  At that time she had very short distance claudication in her left leg.  She was offered an arteriogram but declined at that point and opted for a walking program.  She states she is still having difficulty with her left leg.  She experiences claudication symptoms at about 1/2-1 block.  She also has some early symptoms of rest pain in the left foot.  However she does have an ulcer on her pretibial region which she states is been present for several months.  She recently had a ruptured appendix this was treated with drainage and her drain is out at this point.  She is on aspirin.  States she did recently quit smoking.  Other chronic medical problems include bipolar disorder, coronary artery disease, hypertension all of which are currently stable.      Past Medical History:  Diagnosis Date  . Bipolar disorder ([place])   . CAD (coronary artery disease)    intra and extracranial vascular dx per MRI 4/11, neurology rec strict CVRF control  . Colonic inertia   . Constipation    chronic;severe  . Depression   . Duodenitis   . EKG abnormalities    changes, stress test neg (false EKG changes)  . Gastritis   . GERD (gastroesophageal reflux disease)   . Hypertension   . Hypothyroid 01/16/2014  . Meningioma ([place]) 10/21/2013  . Psoriasis    sees derm  . Stroke [facility])      Social History        Socioeconomic History  . Marital status: Married    Spouse name: Not on file  . Number of children: 1  . Years of education: Not on file  . Highest education level: Not on file  Occupational History  . Occupation: retired  Scientific laboratory technician  . Financial resource strain: Not on file  . Food insecurity:    Worry: Not on file    Inability: Not on file  . Transportation needs:    Medical: Not on file    Non-medical: Not on file  Tobacco Use  . Smoking status: Current Every Day  Smoker    Packs/day: 1.00    Years: 60.00    Pack years: 60.00    Types: Cigarettes  . Smokeless tobacco: Never Used  Substance and Sexual Activity  . Alcohol use: Yes    Alcohol/week: 0.0 standard drinks    Comment: yes on occassion  . Drug use: No  . Sexual activity: Not on file  Lifestyle  . Physical activity:    Days per week: Not on file    Minutes per session: Not on file  . Stress: Not on file  Relationships  . Social connections:    Talks on phone: Not on file    Gets together: Not on file    Attends religious service: Not on file    Active member of club or organization: Not on file    Attends meetings of clubs or organizations: Not on file    Relationship status: Not on file  . Intimate partner violence:    Fear of current or ex partner: Not on file    Emotionally abused: Not on file    Physically abused: Not on file    Forced sexual activity: Not on file  Other Topics Concern  . Not on file  Social History Narrative   Brother in Sports coach Mr Rexford Maus (  one of my patients)   Lives w/ husband               Current Outpatient Medications on File Prior to Visit  Medication Sig Dispense Refill  . amLODipine (NORVASC) 10 MG tablet Take 1 tablet (10 mg total) by mouth daily. 30 tablet 4  . aspirin EC 325 MG EC tablet Take 1 tablet (325 mg total) by mouth daily. 30 tablet 4  . bisacodyl (DULCOLAX) 5 MG EC tablet Take 10 mg by mouth at bedtime.     . CASCARA SAGRADA PO Take 2 capsules by mouth at bedtime.    . clonazePAM (KLONOPIN) 0.5 MG tablet Take 0.5mg  tab every bedtime and 1 tab PRN for anxiety 40 tablet 0  . DULoxetine (CYMBALTA) 30 MG capsule 30 mg at bedtime.    . DULoxetine (CYMBALTA) 60 MG capsule Take 60 mg by mouth every morning.     . famotidine (PEPCID) 20 MG tablet One at bedtime 30 tablet 11  . lamoTRIgine (LAMICTAL) 150 MG tablet Take 150 mg by mouth 2 (two) times daily.    Marland Kitchen linaclotide (LINZESS)  145 MCG CAPS capsule Take 1 capsule (145 mcg total) by mouth daily before breakfast. 30 capsule 3  . mupirocin ointment (BACTROBAN) 2 % Apply 1 application topically 2 (two) times daily. 22 g 0  . polyethylene glycol (MIRALAX / GLYCOLAX) packet Take 34 g by mouth every other day.     Marland Kitchen rOPINIRole (REQUIP) 2 MG tablet TAKE 1 TABLET BY MOUTH AT BEDTIME (Patient taking differently: Take 2 mg by mouth at bedtime. ) 30 tablet 0  . lamoTRIgine (LAMICTAL) 100 MG tablet Take 100 mg by mouth daily.   0  . lithium carbonate 150 MG capsule Take 150 mg by mouth every morning.    Marland Kitchen losartan (COZAAR) 50 MG tablet Take 1 tablet (50 mg total) by mouth daily. (Patient not taking: Reported on 08/15/2018) 30 tablet 4  . meclizine (ANTIVERT) 12.5 MG tablet Take 1 tablet (12.5 mg total) by mouth 3 (three) times daily. (Patient not taking: Reported on 08/15/2018) 90 tablet 2  . senna (SENOKOT) 8.6 MG TABS tablet Take 2 tablets by mouth at bedtime.      No current facility-administered medications on file prior to visit.    Review of systems: She has shortness of breath with exertion.  She denies chest pain.  She denies any fever or chills at this point.  Physical exam:  Vitals:   10/22/18 0610  BP: (!) 179/89  Pulse: 73  Resp: 18  Temp: 98.7 F (37.1 C)  TempSrc: Oral  SpO2: 99%  Weight: 72.6 kg  Height: 5\' 8"  (1.727 m)    Neck: No carotid bruits  Chest: Clear to auscultation bilaterally  Cardiac: Regular rate and rhythm  Abdomen: Soft nontender  Extremities: 2+ femoral pulses bilaterally absent popliteal and pedal pulses bilaterally  Skin: Dependent rubor elevation pallor lower left foot 4 mm shallow pretibial ulcer left leg  Data:: Patient had bilateral ABIs performed today.  I reviewed and interpreted the study.  Left side was 0.31 right side was 0.97  Assessment: Patient with very short distance claudication early rest pain and a fairly ischemic looking left foot at this  point.  Plan: left fem pop today  Ruta Hinds, MD [facility] Office: [phone] Pager: [phone]

## 2018-10-22 NOTE — Plan of Care (Signed)

## 2018-10-22 NOTE — Transfer of Care (Addendum)
Immediate Anesthesia Transfer of Care Note  Patient: [name]  Procedure(s) Performed: LEFT FEMORAL TO BELOW THE KNEE POPLITEAL ARTERY BYPASS GRAFT (Left Leg Lower)  Patient Location: PACU  Anesthesia Type:General  Level of Consciousness: drowsy and patient cooperative  Airway & Oxygen Therapy: Patient Spontanous Breathing and Patient connected to nasal cannula oxygen  Post-op Assessment: Report given to RN, Post -op Vital signs reviewed and stable and Patient moving all extremities X 4  Post vital signs: Reviewed and stable  Last Vitals:  Vitals Value Taken Time  BP 161/71 10/22/2018 12:36 PM  Temp    Pulse 84 10/22/2018 12:37 PM  Resp 16 10/22/2018 12:37 PM  SpO2 98 % 10/22/2018 12:37 PM  Vitals shown include unvalidated device data.  Last Pain:  Vitals:   10/22/18 0627  TempSrc:   PainSc: 0-No pain         Complications: No apparent anesthesia complications

## 2018-10-22 NOTE — Anesthesia Postprocedure Evaluation (Signed)
Anesthesia Post Note  Patient: [name]  Procedure(s) Performed: LEFT FEMORAL TO BELOW THE KNEE POPLITEAL ARTERY BYPASS GRAFT (Left Leg Lower)     Patient location during evaluation: PACU Anesthesia Type: General Level of consciousness: awake and alert Pain management: pain level controlled Vital Signs Assessment: post-procedure vital signs reviewed and stable Respiratory status: spontaneous breathing, nonlabored ventilation, respiratory function stable and patient connected to nasal cannula oxygen Cardiovascular status: blood pressure returned to baseline and stable Postop Assessment: no apparent nausea or vomiting Anesthetic complications: no    Last Vitals:  Vitals:   10/22/18 1330 10/22/18 1345  BP: (!) 142/70 (!) 149/70  Pulse: 77 81  Resp: 17 12  Temp:    SpO2: 97% 97%    Last Pain:  Vitals:   10/22/18 1330  TempSrc:   PainSc: 5                  Masiya Claassen

## 2018-10-22 NOTE — Anesthesia Procedure Notes (Addendum)
Procedure Name: Intubation Date/Time: 10/22/2018 7:55 AM Performed by: Julieta Bellini, CRNA Pre-anesthesia Checklist: Patient identified, Patient being monitored, Timeout performed, Emergency Drugs available and Suction available Patient Re-evaluated:Patient Re-evaluated prior to induction Oxygen Delivery Method: Circle system utilized Preoxygenation: Pre-oxygenation with 100% oxygen Induction Type: IV induction Ventilation: Mask ventilation without difficulty and Oral airway inserted - appropriate to patient size Laryngoscope Size: Mac and 3 Grade View: Grade II Tube type: Oral Tube size: 7.0 mm Number of attempts: 1 Placement Confirmation: positive ETCO2,  CO2 detector and breath sounds checked- equal and bilateral Secured at: 22 cm Tube secured with: Tape Dental Injury: Teeth and Oropharynx as per pre-operative assessment  Comments: ETT placed by Howard Pouch, SRNA

## 2018-10-22 NOTE — Progress Notes (Signed)
Patient arrived to [facility] room 20, CHG bath completed, VSS, CCMD notified. Will continue to monitor and provide care.

## 2018-10-22 NOTE — Progress Notes (Signed)
On shift assessment, patient reported dentures missing. Patient stated "they took them out right before surgery" and patient hasn't seen them since. Nurse checked room 4E20, with no success, family at bedside contacted family at home to check if they were taken home, PACU contacted to look for possible misplacement, and OR contacted to locate dentures. Will advise if dentures are located.

## 2018-10-22 NOTE — Op Note (Signed)
Procedure: Left superficial femoral artery to below-knee popliteal artery bypass using non-reversed left greater saphenous vein  Preoperative diagnosis: Rest pain left foot  Postoperative diagnosis: Same  Anesthesia: General  Assistant: Leontine Locket, PA-C  Operative findings: #1 2.5 mm vein  2.  Coronary marker placed on proximal anastomosis which is mid left superficial femoral artery  Operative details: After obtaining informed consent, the patient was taken to the operating.  The patient was placed in supine position operating table.  After induction of general anesthesia and endotracheal elevation, the patient's entire left lower extremities prepped and draped in usual sterile fashion.  Next a longitudinal incision was made in the left groin carried on through the subcutaneous tissues down level left common femoral artery.  Common femoral artery was dissected free circumferentially.  Superficial femoral and profunda femoris arteries were also dissected free circumferentially and Vesseloops placed around these.  These were soft in character and had a good pulse within them.  Next the greater saphenous vein was harvested through several skip incisions on the medial aspect of the leg.  The vein was about 2-1/2 to 3 mm in diameter until just above the knee and then this tapered fairly small to less than 2 mm diameter.  Side branches were ligated and divided tween silk ties or clips.  The below-knee portion of the saphenectomy site was deepened into the below-knee popliteal space.  Below-knee popliteal artery was dissected free circumferentially Vesseloops placed around it.  It was also soft in character.  I then inspected the vein and the conduit was fairly marginal at its distal portion.  After reviewing the arteriogram I felt that coming off of the superficial femoral artery was going to be a better inflow artery with the amount of reasonable conduit that we had.  Therefore the incision in the mid  thigh was deepened by reflecting the sartorius muscle posteriorly and the superficial femoral artery exposed over several centimeters.  Vesseloops were placed around this.  Next a tunnel was created connecting the below-knee popliteal space to the superficial femoral artery between the heads of the gastrocnemius muscle and subsartorial.  The patient was given 7000 units of intravenous heparin.  The vein was placed in a non-reversed configuration.  Superficial femoral artery was clamped proximally distally with fistula clamps.  Longitudinal opening was made in the artery it had very good inflow.  The vein was spatulated and sewn end of vein to side of artery using a running 6-0 Prolene suture.  Just prior to completion of anastomosis it was for blood backbled and thoroughly flushed.  Anastomosis was secured clamps released there was good pulsatile flow in the graft immediately.  A ring coronary marker was placed at the proximal anastomosis in the event this needs to be located later for arteriogram.  All the valves were then lysed with a valvulotome.  The vein was marked orientation he was then brought through the subsartorial tunnel down to the below-knee popliteal space.  The below-knee popliteal artery was controlled proximally distally with fine bulldog clamps.  Longitudinal opening was made in the below-knee popliteal artery the vein was pulled to length with the leg straight and the vein spatulated and sewn end of vein to side of artery using a running 6-0 Prolene suture.  Just prior to completion anastomosis it was for blood backbled and thoroughly flushed.  Estimates was secured clamps released there is pulsatile flow in the graft and the below-knee popliteal artery immediately.  There was a good Doppler signal  in the posterior tibial and peroneal area of the foot.  This augmented about 80% with unclamping the graft.  At this point hemostasis was obtained with direct pressure and the assistance of 50 mg of  protamine.  All the incisions were then closed in multiple layers of running 2-0 and 3-0 Vicryl suture and 4-0 Vicryl subcuticular stitch in the skin.  The patient tolerated procedure well there were no complications.  The instrument sponge and needle count was correct the end of the case.  The patient was taken the recovery room in stable condition.  Ruta Hinds, MD [facility] [place] Office: [phone] Pager: [phone]

## 2018-10-22 NOTE — Progress Notes (Signed)
[facility] [place]  Subjective  - awake in pacu   Objective (!) 160/72 81 98 F (36.7 C) 14 98%  Intake/Output Summary (Last 24 hours) at 10/22/2018 1253 Last data filed at 10/22/2018 1215 Gross per 24 hour  Intake 1750 ml  Output 360 ml  Net 1390 ml   Left leg incisions no hematoma dopper signals DP PT peroneal doppler  Assessment/Planning: Stable in PACU To 4E when bed available  Ruta Hinds 10/22/2018 12:53 PM --  Laboratory Lab Results: Recent Labs    10/21/18 1412  WBC 7.2  HGB 13.6  HCT 43.5  PLT 242   BMET Recent Labs    10/21/18 1412  NA 139  K 4.2  CL 100  CO2 31  GLUCOSE 108*  BUN 13  CREATININE 0.93  CALCIUM 9.4    COAG Lab Results  Component Value Date   INR 1.01 10/21/2018   INR 1.15 07/09/2018   INR 1.01 10/23/2017   No results found for: PTT

## 2018-10-23 ENCOUNTER — Inpatient Hospital Stay (HOSPITAL_COMMUNITY): Payer: Medicare Other

## 2018-10-23 ENCOUNTER — Ambulatory Visit: Payer: Medicare Other | Admitting: Cardiovascular Disease

## 2018-10-23 ENCOUNTER — Encounter (HOSPITAL_COMMUNITY): Payer: Self-pay | Admitting: General Practice

## 2018-10-23 DIAGNOSIS — I7090 Unspecified atherosclerosis: Secondary | ICD-10-CM

## 2018-10-23 LAB — BASIC METABOLIC PANEL
Anion gap: 7 (ref 5–15)
BUN: 12 mg/dL (ref 8–23)
CO2: 33 mmol/L — AB (ref 22–32)
Calcium: 8.6 mg/dL — ABNORMAL LOW (ref 8.9–10.3)
Chloride: 100 mmol/L (ref 98–111)
Creatinine, Ser: 0.96 mg/dL (ref 0.44–1.00)
GFR calc Af Amer: >60 mL/min (ref >60)
GFR calc non Af Amer: 58 mL/min — ABNORMAL LOW (ref >60)
GLUCOSE: 96 mg/dL (ref 70–99)
Potassium: 4.4 mmol/L (ref 3.5–5.1)
Sodium: 140 mmol/L (ref 135–145)

## 2018-10-23 LAB — CBC
HCT: 34.8 % — ABNORMAL LOW (ref 36.0–46.0)
Hemoglobin: 10.9 g/dL — ABNORMAL LOW (ref 12.0–15.0)
MCH: 29.4 pg (ref 26.0–34.0)
MCHC: 31.3 g/dL (ref 30.0–36.0)
MCV: 93.8 fL (ref 80.0–100.0)
Platelets: 211 10*3/uL (ref 150–400)
RBC: 3.71 MIL/uL — ABNORMAL LOW (ref 3.87–5.11)
RDW: 14.6 % (ref 11.5–15.5)
WBC: 8.4 10*3/uL (ref 4.0–10.5)
nRBC: 0 % (ref 0.0–0.2)

## 2018-10-23 MED ORDER — PNEUMOCOCCAL VAC POLYVALENT 25 MCG/0.5ML IJ INJ
0.5000 mL | INJECTION | INTRAMUSCULAR | Status: AC
  Administered 2018-10-24: 0.5 mL via INTRAMUSCULAR
  Filled 2018-10-23: qty 0.5

## 2018-10-23 MED ORDER — DULOXETINE HCL 30 MG PO CPEP
30.0000 mg | ORAL_CAPSULE | Freq: Every evening | ORAL | Status: DC
  Administered 2018-10-23: 30 mg via ORAL
  Filled 2018-10-23: qty 1

## 2018-10-23 MED ORDER — MAGNESIUM CITRATE PO SOLN
0.5000 | Freq: Every day | ORAL | Status: DC | PRN
  Filled 2018-10-23: qty 296

## 2018-10-23 NOTE — Evaluation (Signed)
Physical Therapy Evaluation Patient Details Name: [name] MRN: [id number] DOB: 12-14-1945 Today's Date: 10/23/2018   History of Present Illness  Pt is a 73 yo female s/p L superficial femoral a. below the knee bypass using on reversal L greater spahenous vein. 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  Given the geography of the home and pt difficulty with mobility, have asked for CIR to begin rehab then go home.  Pt in agreement with this, with the caveat that she could change the plan with being able to ascend the flight that home will require.  Will proceed with therapy with this plan, and focus on recovery of independence acutely.    Follow Up Recommendations CIR    Equipment Recommendations  Rolling walker with 5" wheels    Recommendations for Other Services Rehab consult     Precautions / Restrictions Precautions Precautions: Fall Restrictions Weight Bearing Restrictions: No      Mobility  Bed Mobility Overal bed mobility: Needs Assistance Bed Mobility: Sidelying to Sit;Supine to Sit   Sidelying to sit: Min assist;HOB elevated Supine to sit: Min assist;HOB elevated     General bed mobility comments: for LLE assisted  Transfers Overall transfer level: Needs assistance Equipment used: Rolling walker (2 wheeled) Transfers: Sit to/from Stand Sit to Stand: Mod assist;From elevated surface         General transfer comment: assist for power up  Ambulation/Gait Ambulation/Gait assistance: Min assist Gait Distance (Feet): 35 Feet Assistive device: Rolling walker (2 wheeled);1 person hand held assist Gait Pattern/deviations: Step-through pattern;Step-to pattern;Wide base of support;Decreased weight shift to left;Decreased stride length Gait velocity:  reduced Gait velocity interpretation: <1.31 ft/sec, indicative of household ambulator General Gait Details: pt is in some pain with effort and WB on LLE  Stairs            Wheelchair Mobility    Modified Rankin (Stroke Patients Only)       Balance Overall balance assessment: Needs assistance Sitting-balance support: Feet supported;Bilateral upper extremity supported Sitting balance-Leahy Scale: Good     Standing balance support: Bilateral upper extremity supported;During functional activity Standing balance-Leahy Scale: Poor Standing balance comment: needs extra time and assistance to gather her balance with walker                             Pertinent Vitals/Pain Pain Assessment: 0-10 Pain Score: 4  Pain Location: L LE Pain Descriptors / Indicators: Grimacing;Guarding;Sore;Operative site guarding Pain Intervention(s): Monitored during session;Premedicated before session;Repositioned    Home Living Family/patient expects to be discharged to:: Private residence Living Arrangements: Alone Available Help at Discharge: Other (Comment) Type of Home: House Home Access: Stairs to enter   Entrance Stairs-Number of Steps: 2 Home Layout: Two level;Bed/bath upstairs Home Equipment: Shower seat - built in;Walker - 2 wheels;Cane - single point      Prior Function Level of Independence: Independent with assistive device(s)         Comments: drove and lived alone, did her own housework     Hand Dominance   Dominant Hand: Right    Extremity/Trunk Assessment   Upper Extremity Assessment Upper Extremity Assessment: Generalized weakness    Lower Extremity Assessment Lower Extremity Assessment: Generalized weakness    Cervical / Trunk  Assessment Cervical / Trunk Assessment: Normal  Communication   Communication: No difficulties  Cognition Arousal/Alertness: Awake/alert Behavior During Therapy: Anxious;WFL for tasks assessed/performed Overall  Cognitive Status: Within Functional Limits for tasks assessed                                        General Comments General comments (skin integrity, edema, etc.): Pt is in need of cues and assistance, able to maintain O2 sats with gait at 92%, pulses are within normal range    Exercises     Assessment/Plan    PT Assessment Patient needs continued PT services  PT Problem List Decreased strength;Decreased range of motion;Decreased activity tolerance;Decreased balance;Decreased mobility;Decreased coordination;Decreased knowledge of use of DME;Decreased safety awareness;Cardiopulmonary status limiting activity;Decreased skin integrity;Pain       PT Treatment Interventions Gait training;DME instruction;Stair training;Functional mobility training;Therapeutic activities;Therapeutic exercise;Balance training;Neuromuscular re-education;Patient/family education    PT Goals (Current goals can be found in the Care Plan section)  Acute Rehab PT Goals Patient Stated Goal: to go home safely PT Goal Formulation: With patient Time For Goal Achievement: 11/06/18 Potential to Achieve Goals: Good    Frequency Min 4X/week   Barriers to discharge Inaccessible home environment;Decreased caregiver support stairs in her home and lives alone    Co-evaluation PT/OT/SLP Co-Evaluation/Treatment: Yes Reason for Co-Treatment: Complexity of the patient's impairments (multi-system involvement);For patient/therapist safety PT goals addressed during session: Mobility/safety with mobility;Balance         AM-PAC PT "6 Clicks" Mobility  Outcome Measure Help needed turning from your back to your side while in a flat bed without using bedrails?: A Little Help needed moving from lying on your back to sitting on the side of a flat bed without using bedrails?: A Little Help needed moving to and from a bed to a chair (including a wheelchair)?: A Lot Help needed standing up from a chair using your  arms (e.g., wheelchair or bedside chair)?: A Lot Help needed to walk in hospital room?: A Lot Help needed climbing 3-5 steps with a railing? : Total 6 Click Score: 13    End of Session Equipment Utilized During Treatment: Gait belt Activity Tolerance: Treatment limited secondary to medical complications (Comment);Patient limited by pain Patient left: with call bell/phone within reach;in chair;with chair alarm set Nurse Communication: Mobility status PT Visit Diagnosis: Unsteadiness on feet (R26.81);Muscle weakness (generalized) (M62.81);Difficulty in walking, not elsewhere classified (R26.2);Pain Pain - Right/Left: Left Pain - part of body: Leg;Knee;Hip    Time: 2836-6294 PT Time Calculation (min) (ACUTE ONLY): 32 min   Charges:   PT Evaluation $PT Eval Moderate Complexity: 1 Mod         Ramond Dial 10/23/2018, 5:13 PM  Mee Hives, PT MS Acute Rehab Dept. Number: [place] [facility] [place]

## 2018-10-23 NOTE — Progress Notes (Addendum)
  Progress Note    10/23/2018 7:31 AM 1 Day Post-Op  Subjective:  Says she had pain in her toes yesterday but better today.  Does not want to get out of bed or have foley removed.  Tm 99.3  HR 70's-80's NSR 542'H-062'B systolic 76% RA  Vitals:   10/23/18 0219 10/23/18 0449  BP:  137/67  Pulse: 73 78  Resp: 13 13  Temp:  99.3 F (37.4 C)  SpO2: 93% 92%    Physical Exam: Cardiac:  Regular Lungs:  Non labored Incisions:  All are clean and dry Extremities:  Brisk doppler signals left DP/PT/peroneal; left foot warm and well perfused.   CBC    Component Value Date/Time   WBC 8.4 10/23/2018 0010   RBC 3.71 (L) 10/23/2018 0010   HGB 10.9 (L) 10/23/2018 0010   HGB 13.6 03/05/2018 1758   HCT 34.8 (L) 10/23/2018 0010   HCT 41.9 03/05/2018 1758   PLT 211 10/23/2018 0010   PLT 241 03/05/2018 1758   MCV 93.8 10/23/2018 0010   MCV 88.6 04/30/2018 1628   MCV 93 03/05/2018 1758   MCH 29.4 10/23/2018 0010   MCHC 31.3 10/23/2018 0010   RDW 14.6 10/23/2018 0010   RDW 14.5 03/05/2018 1758   LYMPHSABS 2.7 10/23/2017 1630   MONOABS 0.6 10/23/2017 1630   EOSABS 0.2 10/23/2017 1630   BASOSABS 0.0 10/23/2017 1630    BMET    Component Value Date/Time   NA 140 10/23/2018 0010   NA 139 03/05/2018 1758   K 4.4 10/23/2018 0010   CL 100 10/23/2018 0010   CO2 33 (H) 10/23/2018 0010   GLUCOSE 96 10/23/2018 0010   BUN 12 10/23/2018 0010   BUN 13 03/05/2018 1758   CREATININE 0.96 10/23/2018 0010   CREATININE 0.77 07/28/2015 1805   CALCIUM 8.6 (L) 10/23/2018 0010   GFRNONAA 58 (L) 10/23/2018 0010   GFRAA >60 10/23/2018 0010    INR    Component Value Date/Time   INR 1.01 10/21/2018 1412     Intake/Output Summary (Last 24 hours) at 10/23/2018 0731 Last data filed at 10/23/2018 0451 Gross per 24 hour  Intake 2974.35 ml  Output 2110 ml  Net 864.35 ml     Assessment:  73 y.o. female is s/p:  Left superficial femoral artery to below-knee popliteal artery bypass using  non-reversed left greater saphenous vein  1 Day Post-Op  Plan: -pt with brisk left DP/PT/peroneal doppler signals -incisions look good  -pt did not take pain med until this am-she is worried about constipation.  Discussed with her that she will need pain med to get up and move.  She takes mag citrate when she is constipated.  Will order 1/2 bottle mag citrate.   Explained to her that it is going to be important to get up out of bed.  -remove foley -DVT prophylaxis:  Lovenox   Leontine Locket, PA-C [facility] [phone] 10/23/2018 7:31 AM  Agree with above. Needs some motivation to get going today  Ruta Hinds, MD [facility] [place] Office: [phone] Pager: [phone]

## 2018-10-23 NOTE — Evaluation (Signed)
Occupational Therapy Evaluation Patient Details Name: [name] MRN: [id number] DOB: 05-31-1946 Today's Date: 10/23/2018    History of Present Illness Pt is a 73 yo female s/p L superficial femoral a. below the knee bypass using on reversal L greater spahenous vein. 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 Pt currently limited by pain in LLE and inability to care for self properly requiring assist with ADL- UB ADL set-upA and ModA for LB ADL and mobility with RW with ModA overall. Pt moving well, but limited by pain and full flight of stairs in home. Pt would greatly benefit from continued OT skilled services for ADL, mobility and safety in  SNF setting. If pt progresses, pt would rather be appropriate for [facility] setting, but unsafe at this time.    Follow Up Recommendations  SNF;Supervision/Assistance - 24 hour    Equipment Recommendations  3 in 1 bedside commode;None recommended by OT    Recommendations for Other Services       Precautions / Restrictions Precautions Precautions: Fall Restrictions Weight Bearing Restrictions: No      Mobility Bed Mobility Overal bed mobility: Needs Assistance Bed Mobility: Sidelying to Sit;Supine to Sit   Sidelying to sit: Min assist;HOB elevated Supine to sit: Min assist;HOB elevated     General bed mobility comments: for LLE assisted  Transfers Overall transfer level: Needs assistance Equipment used: Rolling walker (2 wheeled) Transfers: Sit to/from Stand Sit to Stand: Mod assist;From elevated surface         General transfer comment: assist for power up    Balance                                           ADL either performed or assessed with clinical judgement   ADL Overall ADL's : Needs assistance/impaired Eating/Feeding: Set up   Grooming: Set up    Upper Body Bathing: Set up   Lower Body Bathing: Moderate assistance   Upper Body Dressing : Set up   Lower Body Dressing: Moderate assistance   Toilet Transfer: Moderate assistance   Toileting- Clothing Manipulation and Hygiene: Moderate assistance;Sitting/lateral lean;Sit to/from stand       Functional mobility during ADLs: Minimal assistance;Moderate assistance;Rolling walker(ModA from bed to standing) General ADL Comments: Pt requires set-upA for UB ADL and ModA for LB ADL due to weakness in LLE. Pt performing sitting for grooming with set-upA.     Vision Baseline Vision/History: No visual deficits       Perception     Praxis      Pertinent Vitals/Pain Pain Assessment: 0-10 Pain Score: 4  Pain Location: L LE Pain Descriptors / Indicators: Grimacing;Guarding;Sore Pain Intervention(s): Repositioned;Monitored during session     Hand Dominance Right   Extremity/Trunk Assessment Upper Extremity Assessment Upper Extremity Assessment: Generalized weakness   Lower Extremity Assessment Lower Extremity Assessment: Generalized weakness;LLE deficits/detail;Defer to PT evaluation LLE Deficits / Details: s/p recent sx   Cervical / Trunk Assessment Cervical / Trunk Assessment: Normal   Communication Communication Communication: No difficulties   Cognition Arousal/Alertness: Awake/alert Behavior During Therapy: Anxious;WFL for tasks assessed/performed Overall Cognitive Status: Within Functional Limits for tasks assessed  General Comments       Exercises     Shoulder Instructions      Home Living Family/patient expects to be discharged to:: Private residence Living Arrangements: Alone Available Help at Discharge: Other (Comment)(nephew staying for a week or 2 from out of town) Type of Home: House Home Access: Stairs to enter Technical brewer of Steps: 2   Home Layout: Two level;Bed/bath  upstairs Alternate Level Stairs-Number of Steps: flight Alternate Level Stairs-Rails: Can reach both Bathroom Shower/Tub: Occupational psychologist: Handicapped height     Home Equipment: [place] - built in;Walker - 2 wheels;Cane - single point          Prior Functioning/Environment Level of Independence: Independent with assistive device(s)        Comments: was driving        OT Problem List: Decreased strength;Decreased range of motion;Decreased activity tolerance;Impaired balance (sitting and/or standing);Decreased coordination;Decreased safety awareness;Impaired UE functional use;Increased edema(impaired UE use from IV site at wrist)      OT Treatment/Interventions: Self-care/ADL training;Therapeutic exercise;Energy conservation;Therapeutic activities;Patient/family education;Balance training    OT Goals(Current goals can be found in the care plan section) Acute Rehab OT Goals Patient Stated Goal: to go home safely OT Goal Formulation: With patient Time For Goal Achievement: 11/06/18 Potential to Achieve Goals: Good ADL Goals Pt Will Perform Lower Body Dressing: with set-up Pt Will Perform Toileting - Clothing Manipulation and hygiene: with modified independence Pt/caregiver will Perform Home Exercise Program: Independently;Both right and left upper extremity;With written HEP provided Additional ADL Goal #1: Pt will perform all ADL functional mobility using RW and and fair balance with modified independence.  OT Frequency: Min 2X/week   Barriers to D/C: Decreased caregiver support(available for 4 weeks post surgery)          Co-evaluation              AM-PAC OT "6 Clicks" Daily Activity     Outcome Measure Help from another person eating meals?: None Help from another person taking care of personal grooming?: None Help from another person toileting, which includes using toliet, bedpan, or urinal?: A Little Help from another person bathing  (including washing, rinsing, drying)?: A Lot Help from another person to put on and taking off regular upper body clothing?: A Little Help from another person to put on and taking off regular lower body clothing?: A Lot 6 Click Score: 18   End of Session Equipment Utilized During Treatment: Gait belt;Rolling walker Nurse Communication: Mobility status  Activity Tolerance: Patient tolerated treatment well Patient left: in chair;with call bell/phone within reach;with chair alarm set  OT Visit Diagnosis: Unsteadiness on feet (R26.81);Muscle weakness (generalized) (M62.81)                Time: 4081-4481 OT Time Calculation (min): 20 min Charges:  OT General Charges $OT Visit: 1 Visit OT Evaluation $OT Eval Moderate Complexity: 1 Mod  Darryl Nestle) Marsa Aris OTR/L Acute Rehabilitation Services Pager: [phone] Office: [phone]   Fredda Hammed 10/23/2018, 12:10 PM

## 2018-10-23 NOTE — Progress Notes (Signed)
   10/23/18 1500  Clinical Encounter Type  Visited With Patient  Visit Type Initial  Referral From Nurse  Consult/Referral To Chaplain  Spiritual Encounters  Spiritual Needs Other (Comment)  Stress Factors  Patient Stress Factors Exhausted   Responded to spiritual care consult. PT was resting and visibly tired. PT wanted a copy of an AD. I gave her one and she stated that she needed to talk to her son. I gave her an overview of the AD. Pt was thankful and seemed like she wanted to go to sleep. Chaplain available as needed.  Chaplain Fidel Levy  [phone]

## 2018-10-23 NOTE — Progress Notes (Signed)
Rehab Admissions Coordinator Note:  Patient was screened by Cleatrice Burke for appropriateness for an Inpatient Acute Rehab Consult per PT recs.   At this time, we are recommending Inpatient Rehab consult. Please place order for consult.  Cleatrice Burke 10/23/2018, 5:52 PM  I can be reached at [phone].

## 2018-10-23 NOTE — Progress Notes (Signed)
Left ABI 0.3 preop 0.6 today.  Has mainly peroneal runoff will see how she does with this.   Ruta Hinds, MD [facility] [place] Office: [phone] Pager: [phone]

## 2018-10-23 NOTE — Progress Notes (Signed)
ABI completed. Refer to "CV Proc" under chart review to view preliminary results.  10/23/2018 1:49 PM Maudry Mayhew, MHA, RVT, RDCS, RDMS

## 2018-10-24 ENCOUNTER — Telehealth: Payer: Self-pay | Admitting: Vascular Surgery

## 2018-10-24 ENCOUNTER — Inpatient Hospital Stay (HOSPITAL_COMMUNITY)
Admission: RE | Admit: 2018-10-24 | Discharge: 2018-10-29 | DRG: 945 | Disposition: A | Discharge status: Home Health | Payer: Medicare Other | Source: Intra-hospital | Attending: Physical Medicine & Rehabilitation | Admitting: Physical Medicine & Rehabilitation

## 2018-10-24 ENCOUNTER — Other Ambulatory Visit: Payer: Self-pay

## 2018-10-24 ENCOUNTER — Encounter: Payer: Self-pay | Admitting: *Deleted

## 2018-10-24 ENCOUNTER — Ambulatory Visit: Payer: Self-pay | Admitting: Psychiatry

## 2018-10-24 ENCOUNTER — Encounter (HOSPITAL_COMMUNITY): Payer: Self-pay | Admitting: *Deleted

## 2018-10-24 DIAGNOSIS — L409 Psoriasis, unspecified: Secondary | ICD-10-CM | POA: Diagnosis present

## 2018-10-24 DIAGNOSIS — Z833 Family history of diabetes mellitus: Secondary | ICD-10-CM

## 2018-10-24 DIAGNOSIS — Z8673 Personal history of transient ischemic attack (TIA), and cerebral infarction without residual deficits: Secondary | ICD-10-CM

## 2018-10-24 DIAGNOSIS — F319 Bipolar disorder, unspecified: Secondary | ICD-10-CM | POA: Diagnosis present

## 2018-10-24 DIAGNOSIS — D62 Acute posthemorrhagic anemia: Secondary | ICD-10-CM

## 2018-10-24 DIAGNOSIS — K5900 Constipation, unspecified: Secondary | ICD-10-CM | POA: Diagnosis present

## 2018-10-24 DIAGNOSIS — F17210 Nicotine dependence, cigarettes, uncomplicated: Secondary | ICD-10-CM | POA: Diagnosis present

## 2018-10-24 DIAGNOSIS — G4700 Insomnia, unspecified: Secondary | ICD-10-CM | POA: Diagnosis not present

## 2018-10-24 DIAGNOSIS — E8809 Other disorders of plasma-protein metabolism, not elsewhere classified: Secondary | ICD-10-CM

## 2018-10-24 DIAGNOSIS — Z8249 Family history of ischemic heart disease and other diseases of the circulatory system: Secondary | ICD-10-CM

## 2018-10-24 DIAGNOSIS — Z811 Family history of alcohol abuse and dependence: Secondary | ICD-10-CM | POA: Diagnosis not present

## 2018-10-24 DIAGNOSIS — I739 Peripheral vascular disease, unspecified: Secondary | ICD-10-CM

## 2018-10-24 DIAGNOSIS — Z801 Family history of malignant neoplasm of trachea, bronchus and lung: Secondary | ICD-10-CM

## 2018-10-24 DIAGNOSIS — G479 Sleep disorder, unspecified: Secondary | ICD-10-CM | POA: Diagnosis not present

## 2018-10-24 DIAGNOSIS — I10 Essential (primary) hypertension: Secondary | ICD-10-CM | POA: Diagnosis not present

## 2018-10-24 DIAGNOSIS — I70209 Unspecified atherosclerosis of native arteries of extremities, unspecified extremity: Secondary | ICD-10-CM | POA: Diagnosis present

## 2018-10-24 DIAGNOSIS — G8918 Other acute postprocedural pain: Secondary | ICD-10-CM | POA: Diagnosis not present

## 2018-10-24 DIAGNOSIS — E039 Hypothyroidism, unspecified: Secondary | ICD-10-CM | POA: Diagnosis present

## 2018-10-24 DIAGNOSIS — K219 Gastro-esophageal reflux disease without esophagitis: Secondary | ICD-10-CM | POA: Diagnosis present

## 2018-10-24 DIAGNOSIS — I714 Abdominal aortic aneurysm, without rupture: Secondary | ICD-10-CM | POA: Diagnosis present

## 2018-10-24 DIAGNOSIS — E46 Unspecified protein-calorie malnutrition: Secondary | ICD-10-CM

## 2018-10-24 DIAGNOSIS — Z7982 Long term (current) use of aspirin: Secondary | ICD-10-CM | POA: Diagnosis not present

## 2018-10-24 DIAGNOSIS — R5381 Other malaise: Secondary | ICD-10-CM | POA: Diagnosis not present

## 2018-10-24 DIAGNOSIS — R269 Unspecified abnormalities of gait and mobility: Secondary | ICD-10-CM

## 2018-10-24 DIAGNOSIS — E785 Hyperlipidemia, unspecified: Secondary | ICD-10-CM | POA: Diagnosis present

## 2018-10-24 DIAGNOSIS — I2510 Atherosclerotic heart disease of native coronary artery without angina pectoris: Secondary | ICD-10-CM | POA: Diagnosis present

## 2018-10-24 DIAGNOSIS — Z803 Family history of malignant neoplasm of breast: Secondary | ICD-10-CM | POA: Diagnosis not present

## 2018-10-24 DIAGNOSIS — Z720 Tobacco use: Secondary | ICD-10-CM | POA: Diagnosis not present

## 2018-10-24 MED ORDER — ENOXAPARIN SODIUM 30 MG/0.3ML ~~LOC~~ SOLN: 30.0000 mg | SUBCUTANEOUS | Status: DC

## 2018-10-24 MED ORDER — POLYETHYLENE GLYCOL 3350 17 G PO PACK
17.0000 g | PACK | ORAL | Status: DC
  Administered 2018-10-25: 17 g via ORAL
  Filled 2018-10-24 (×2): qty 1

## 2018-10-24 MED ORDER — SORBITOL 70 % SOLN
30.0000 mL | Freq: Every day | Status: DC | PRN
  Administered 2018-10-25: 30 mL via ORAL
  Filled 2018-10-24 (×2): qty 30

## 2018-10-24 MED ORDER — BISACODYL 10 MG RE SUPP: 10.0000 mg | Freq: Every day | RECTAL | Status: DC | PRN

## 2018-10-24 MED ORDER — PANTOPRAZOLE SODIUM 40 MG PO TBEC
40.0000 mg | DELAYED_RELEASE_TABLET | Freq: Every day | ORAL | Status: DC
  Administered 2018-10-25 – 2018-10-29 (×5): 40 mg via ORAL
  Filled 2018-10-24 (×5): qty 1

## 2018-10-24 MED ORDER — CLONAZEPAM 0.5 MG PO TABS
0.5000 mg | ORAL_TABLET | Freq: Every day | ORAL | Status: DC
  Administered 2018-10-24 – 2018-10-28 (×5): 0.5 mg via ORAL
  Filled 2018-10-24 (×5): qty 1

## 2018-10-24 MED ORDER — GUAIFENESIN-DM 100-10 MG/5ML PO SYRP: 15.0000 mL | ORAL_SOLUTION | ORAL | Status: DC | PRN

## 2018-10-24 MED ORDER — DULOXETINE HCL 30 MG PO CPEP
60.0000 mg | ORAL_CAPSULE | Freq: Every day | ORAL | Status: DC
  Administered 2018-10-25 – 2018-10-29 (×5): 60 mg via ORAL
  Filled 2018-10-24 (×5): qty 2

## 2018-10-24 MED ORDER — DULOXETINE HCL 30 MG PO CPEP
30.0000 mg | ORAL_CAPSULE | Freq: Every evening | ORAL | Status: DC
  Administered 2018-10-24 – 2018-10-28 (×5): 30 mg via ORAL
  Filled 2018-10-24 (×5): qty 1

## 2018-10-24 MED ORDER — OXYCODONE-ACETAMINOPHEN 5-325 MG PO TABS
1.0000 | ORAL_TABLET | ORAL | Status: DC | PRN
  Administered 2018-10-24: 2 via ORAL
  Administered 2018-10-25: 1 via ORAL
  Administered 2018-10-25 – 2018-10-27 (×5): 2 via ORAL
  Administered 2018-10-28 (×2): 1 via ORAL
  Administered 2018-10-28: 2 via ORAL
  Administered 2018-10-29: 1 via ORAL
  Filled 2018-10-24: qty 2
  Filled 2018-10-24 (×2): qty 1
  Filled 2018-10-24: qty 2
  Filled 2018-10-24 (×2): qty 1
  Filled 2018-10-24 (×5): qty 2

## 2018-10-24 MED ORDER — ACETAMINOPHEN 650 MG RE SUPP: 325.0000 mg | RECTAL | Status: DC | PRN

## 2018-10-24 MED ORDER — ROPINIROLE HCL 1 MG PO TABS
2.0000 mg | ORAL_TABLET | Freq: Every day | ORAL | Status: DC
  Administered 2018-10-24 – 2018-10-28 (×5): 2 mg via ORAL
  Filled 2018-10-24 (×5): qty 2

## 2018-10-24 MED ORDER — ENOXAPARIN SODIUM 30 MG/0.3ML ~~LOC~~ SOLN
30.0000 mg | SUBCUTANEOUS | Status: DC
  Administered 2018-10-25: 30 mg via SUBCUTANEOUS
  Filled 2018-10-24 (×5): qty 0.3

## 2018-10-24 MED ORDER — BISACODYL 5 MG PO TBEC
5.0000 mg | DELAYED_RELEASE_TABLET | Freq: Every day | ORAL | Status: DC | PRN
  Administered 2018-10-27: 5 mg via ORAL
  Filled 2018-10-24 (×2): qty 1

## 2018-10-24 MED ORDER — ASPIRIN EC 81 MG PO TBEC
81.0000 mg | DELAYED_RELEASE_TABLET | Freq: Every day | ORAL | Status: DC
  Administered 2018-10-25 – 2018-10-29 (×5): 81 mg via ORAL
  Filled 2018-10-24 (×5): qty 1

## 2018-10-24 MED ORDER — LINACLOTIDE 145 MCG PO CAPS
145.0000 ug | ORAL_CAPSULE | Freq: Every day | ORAL | Status: DC
  Administered 2018-10-24 – 2018-10-28 (×5): 145 ug via ORAL
  Filled 2018-10-24 (×5): qty 1

## 2018-10-24 MED ORDER — SENNA 8.6 MG PO TABS: 17.2000 mg | ORAL_TABLET | ORAL | Status: DC

## 2018-10-24 MED ORDER — ACETAMINOPHEN 325 MG PO TABS: 325.0000 mg | ORAL_TABLET | ORAL | Status: DC | PRN

## 2018-10-24 MED ORDER — LAMOTRIGINE 150 MG PO TABS
150.0000 mg | ORAL_TABLET | Freq: Every day | ORAL | Status: DC
  Administered 2018-10-25 – 2018-10-28 (×4): 150 mg via ORAL
  Filled 2018-10-24 (×4): qty 1

## 2018-10-24 MED ORDER — ATORVASTATIN CALCIUM 40 MG PO TABS
40.0000 mg | ORAL_TABLET | Freq: Every day | ORAL | Status: DC
  Administered 2018-10-24 – 2018-10-28 (×5): 40 mg via ORAL
  Filled 2018-10-24 (×5): qty 1

## 2018-10-24 NOTE — Progress Notes (Signed)
Occupational Therapy Treatment Patient Details Name: [name] MRN: [id number] DOB: 1945/11/14 Today's Date: 10/24/2018    History of present illness Pt is a 73 yo female s/p L superficial femoral a. below the knee bypass using on reversal L greater spahenous vein. 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 Performed seated grooming, UB dressing and transferred bed to chair for lunch. Pt to d/c to CIR this pm.  Follow Up Recommendations  CIR    Equipment Recommendations  3 in 1 bedside commode    Recommendations for Other Services      Precautions / Restrictions Precautions Precautions: Fall Restrictions Weight Bearing Restrictions: Yes LLE Weight Bearing: Weight bearing as tolerated       Mobility Bed Mobility Overal bed mobility: Needs Assistance Bed Mobility: Supine to Sit     Supine to sit: Min assist;HOB elevated     General bed mobility comments: assist for LLE, increased time  Transfers Overall transfer level: Needs assistance Equipment used: Rolling walker (2 wheeled) Transfers: Sit to/from [facility] Sit to Stand: Min assist;From elevated surface Stand pivot transfers: Min assist       General transfer comment: assist to rise, cues for hand placement and technique    Balance Overall balance assessment: Needs assistance   Sitting balance-Leahy Scale: Good     Standing balance support: Bilateral upper extremity supported;During functional activity Standing balance-Leahy Scale: Poor                             ADL either performed or assessed with clinical judgement   ADL Overall ADL's : Needs assistance/impaired     Grooming: Wash/dry hands;Wash/dry face;Brushing hair;Sitting;Set up           Upper Body Dressing : Set up;Sitting   Lower Body Dressing:  Maximal assistance;Sit to/from stand                       Vision       Perception     Praxis      Cognition Arousal/Alertness: Awake/alert Behavior During Therapy: Anxious;Impulsive Overall Cognitive Status: Impaired/Different from baseline Area of Impairment: Safety/judgement                         Safety/Judgement: Decreased awareness of safety              Exercises     Shoulder Instructions       General Comments      Pertinent Vitals/ Pain       Pain Assessment: Faces Faces Pain Scale: Hurts whole lot Pain Location: L LE Pain Descriptors / Indicators: Grimacing;Guarding;Sore;Operative site guarding Pain Intervention(s): Monitored during session;Repositioned  Home Living                                          Prior Functioning/Environment              Frequency  Min 2X/week        Progress Toward Goals  OT Goals(current goals can now be found in the care plan section)  Progress towards OT goals: Progressing toward goals  Acute Rehab OT Goals Patient Stated Goal: to  go home safely OT Goal Formulation: With patient Time For Goal Achievement: 11/06/18 Potential to Achieve Goals: Good  Plan Discharge plan needs to be updated    Co-evaluation                 AM-PAC OT "6 Clicks" Daily Activity     Outcome Measure   Help from another person eating meals?: None Help from another person taking care of personal grooming?: None Help from another person toileting, which includes using toliet, bedpan, or urinal?: A Lot Help from another person bathing (including washing, rinsing, drying)?: A Lot Help from another person to put on and taking off regular upper body clothing?: A Little Help from another person to put on and taking off regular lower body clothing?: A Lot 6 Click Score: 17    End of Session Equipment Utilized During Treatment: Gait belt;Rolling walker  OT Visit Diagnosis:  Unsteadiness on feet (R26.81);Muscle weakness (generalized) (M62.81)   Activity Tolerance Patient limited by pain   Patient Left in chair;with call bell/phone within reach;with family/visitor present   Nurse Communication          Time: 9735-3299 OT Time Calculation (min): 16 min  Charges: OT General Charges $OT Visit: 1 Visit OT Treatments $Self Care/Home Management : 8-22 mins  Nestor Lewandowsky, OTR/L Acute Rehabilitation Services Pager: [phone] Office: [phone]  [name] 10/24/2018, 11:57 AM

## 2018-10-24 NOTE — Progress Notes (Signed)
Inpatient Rehabilitation-Admissions Coordinator    Met with patient at the bedside to discuss team's recommendation for inpatient rehabilitation. Shared booklets, expectations while in CIR, expected length of stay, and anticipated functional level at DC.    Pt wants to pursue CIR. AC has received medical clearance for admit to CIR today. AC will update RN, CM/SW regarding plan.   Please call if questions.   Jhonnie Garner, OTR/L  Rehab Admissions Coordinator  [phone] 10/24/2018 10:05 AM

## 2018-10-24 NOTE — Telephone Encounter (Signed)
-----   Message from [name], [place] sent at 10/24/2018 10:04 AM EST -----  S/P left fem pop bypass by Dr. Oneida Alar f/u with Dr. Oneida Alar in 2-3 weeks

## 2018-10-24 NOTE — Progress Notes (Signed)
Jenny Staggers, MD  Physician  Physical Medicine and Rehabilitation  Consult Note  Signed  Date of Service:  10/24/2018 7:30 AM       Related encounter: Admission (Current) from 10/22/2018 in [facility] 4E CV SURGICAL PROGRESSIVE CARE      Signed      Expand All Collapse All         Physical Medicine and Rehabilitation Consult Reason for Consult:  Decreased functional mobility Referring Physician: Dr.Fields   HPI: [name] is a 73 y.o.right handed female with history of bipolar disorder, AAA, CVA, CAD, hypertensionand tobacco abuse and peripheral vascular disease. Per chart review patient lives with spouse and independent  prior to admission. Two-level home with bedroom and bathroom downstairs and 2 steps to entry. Husband with history of dementia and limited assistance son from [place] is currently providing assistance. Presented 10/22/2018 followed by vascular surgery for long history of peripheral vascular disease noted claudication left lower extremity as well as an ulcer on her pretibial region times several months. Due to progressive rest pain to the left foot patient underwent left superficial femoral artery to below-knee popliteal artery bypass using non-reverse left greater saphenous vein 10/22/2018 per Dr.Fields. Hospital course pain management. Acute blood loss anemia 10.9. Subcutaneous Lovenox for DVT prophylaxis. Therapy evaluations completed with recommendations of physical medicine rehabilitation consult.   Review of Systems  Constitutional: Negative for chills and fever.  HENT: Negative for hearing loss.   Eyes: Negative for blurred vision and double vision.  Respiratory: Negative for cough and shortness of breath.   Cardiovascular: Positive for leg swelling. Negative for chest pain and palpitations.  Gastrointestinal: Positive for constipation. Negative for nausea and vomiting.       GERD  Genitourinary: Negative for dysuria, flank pain and hematuria.   Musculoskeletal: Positive for joint pain and myalgias.  Skin: Negative for rash.  Psychiatric/Behavioral: Positive for depression.       Bipolar disorder, anxiety  All other systems reviewed and are negative.      Past Medical History:  Diagnosis Date  . AAA (abdominal aortic aneurysm) (HCC)    3.1 cm 07/08/18, 3 year follow-up recommended; possible 3 cm AAA by aortogram 09/13/18  . Anemia    PMH  . Appendicitis with abscess    07/08/18, s/p perc drain; resolved 07/30/18 by CT  . Arthritis   . Bipolar disorder ([place])   . Cerebrovascular disease    intra and extracranial vascular dx per MRI 4/11, neurology rec strict CVRF control  . Colonic inertia   . Constipation    chronic;severe  . Coronary artery disease   . Depression   . Duodenitis   . EKG abnormalities    changes, stress test neg (false EKG changes)  . Gastritis   . GERD (gastroesophageal reflux disease)   . Hypertension   . Hypothyroid 01/16/2014  . Meningioma ([place]) 10/21/2013  . PAD (peripheral artery disease) ([place])   . Psoriasis    sees derm  . Stroke ([place])   . Wears dentures   . Wears glasses         Past Surgical History:  Procedure Laterality Date  . ABDOMINAL AORTOGRAM W/LOWER EXTREMITY N/A 09/13/2018   Procedure: ABDOMINAL AORTOGRAM W/LOWER EXTREMITY;  Surgeon: Elam Dutch, MD;  Location: [facility] [facility];  Service: Cardiovascular;  Laterality: N/A;  . arthroscopy  04/2010   Right knee  . CATARACT EXTRACTION W/ INTRAOCULAR LENS  IMPLANT, BILATERAL    . COLONOSCOPY    .  FEMORAL-POPLITEAL BYPASS GRAFT  10/22/2018  . FEMORAL-POPLITEAL BYPASS GRAFT Left 10/22/2018   Procedure: LEFT FEMORAL TO BELOW THE KNEE POPLITEAL ARTERY BYPASS GRAFT;  Surgeon: Elam Dutch, MD;  Location: [facility];  Service: Vascular;  Laterality: Left;  . IR RADIOLOGIST EVAL & MGMT  07/30/2018  . MULTIPLE TOOTH EXTRACTIONS    . TUBAL LIGATION          Family History  Problem  Relation Age of Onset  . Breast cancer Mother        metastisis to bones  . Diabetes Son   . Heart disease Other        grandfather   . Alcohol abuse Brother   . Heart disease Brother   . Heart disease Maternal Aunt   . Lung cancer Brother        smoked  . Colon cancer Neg Hx    Social History:  reports that she has been smoking cigarettes. She has a 60.00 pack-year smoking history. She has never used smokeless tobacco. She reports current alcohol use. She reports that she does not use drugs. Allergies: No Known Allergies       Medications Prior to Admission  Medication Sig Dispense Refill  . aspirin EC 81 MG tablet Take 81 mg by mouth daily.    . bisacodyl (DULCOLAX) 5 MG EC tablet Take 5-10 mg by mouth daily as needed (constipation.).     Marland Kitchen clonazePAM (KLONOPIN) 0.5 MG tablet Take 0.5mg  tab every bedtime and 1 tab PRN for anxiety (Patient taking differently: Take 0.5 mg by mouth at bedtime. ) 40 tablet 0  . DULoxetine (CYMBALTA) 60 MG capsule Take 60 mg by mouth daily.     . famotidine (PEPCID) 20 MG tablet One at bedtime (Patient taking differently: Take 20 mg by mouth daily as needed (nausea). One at bedtime) 30 tablet 11  . lamoTRIgine (LAMICTAL) 150 MG tablet Take 1 tablet (150 mg total) by mouth 2 (two) times daily. (Patient taking differently: Take 150 mg by mouth daily. ) 180 tablet 0  . linaclotide (LINZESS) 145 MCG CAPS capsule Take 1 capsule (145 mcg total) by mouth daily before breakfast. (Patient taking differently: Take 145 mcg by mouth at bedtime. ) 30 capsule 3  . magnesium citrate SOLN Take 1 Bottle by mouth daily as needed for severe constipation (once a week as needed for severe constipation).    . polyethylene glycol powder (GLYCOLAX/MIRALAX) powder Take 17 g by mouth every other day.    Marland Kitchen rOPINIRole (REQUIP) 2 MG tablet TAKE 1 TABLET BY MOUTH AT BEDTIME (Patient taking differently: Take 2 mg by mouth at bedtime. ) 30 tablet 0  . Sennosides 25 MG  TABS Take 25 mg by mouth every other day. At night.    Marland Kitchen amLODipine (NORVASC) 10 MG tablet Take 1 tablet (10 mg total) by mouth daily. (Patient not taking: Reported on 10/15/2018) 30 tablet 4  . atorvastatin (LIPITOR) 40 MG tablet Take 1 tablet (40 mg total) by mouth daily. (Patient not taking: Reported on 10/15/2018) 30 tablet 5  . DULoxetine (CYMBALTA) 30 MG capsule Take 30 mg by mouth every evening.     Marland Kitchen losartan (COZAAR) 50 MG tablet Take 1 tablet (50 mg total) by mouth daily. (Patient not taking: Reported on 10/15/2018) 30 tablet 4  . meclizine (ANTIVERT) 12.5 MG tablet Take 1 tablet (12.5 mg total) by mouth 3 (three) times daily. (Patient not taking: Reported on 10/15/2018) 90 tablet 2  . mupirocin ointment (BACTROBAN)  2 % APPLY EXTERNALLY TO THE AFFECTED AREA TWICE DAILY (Patient taking differently: Apply 1 application topically 2 (two) times daily as needed (for wound/infection.). ) 22 g 0    Home: Home Living Family/patient expects to be discharged to:: Private residence Living Arrangements: Alone Available Help at Discharge: Other (Comment) Type of Home: House Home Access: Stairs to enter Entrance Stairs-Number of Steps: 2 Home Layout: Two level, Bed/bath upstairs Alternate Level Stairs-Number of Steps: flight Alternate Level Stairs-Rails: Can reach both Bathroom Shower/Tub: Multimedia programmer: Handicapped height Bathroom Accessibility: Yes Home Equipment: Civil engineer, contracting - built in, Environmental consultant - 2 wheels, Naomi - single point  Functional History: Prior Function Level of Independence: Independent with assistive device(s) Comments: drove and lived alone, did her own housework Functional Status:  Mobility: Bed Mobility Overal bed mobility: Needs Assistance Bed Mobility: Sidelying to Sit, Supine to Sit Sidelying to sit: Min assist, HOB elevated Supine to sit: Min assist, HOB elevated General bed mobility comments: for LLE assisted Transfers Overall transfer level: Needs  assistance Equipment used: Rolling walker (2 wheeled) Transfers: Sit to/from Stand Sit to Stand: Mod assist, From elevated surface General transfer comment: assist for power up Ambulation/Gait Ambulation/Gait assistance: Min assist Gait Distance (Feet): 35 Feet Assistive device: Rolling walker (2 wheeled), 1 person hand held assist Gait Pattern/deviations: Step-through pattern, Step-to pattern, Wide base of support, Decreased weight shift to left, Decreased stride length General Gait Details: pt is in some pain with effort and WB on LLE Gait velocity: reduced Gait velocity interpretation: <1.31 ft/sec, indicative of household ambulator  ADL: ADL Overall ADL's : Needs assistance/impaired Eating/Feeding: Set up Grooming: Set up Upper Body Bathing: Set up Lower Body Bathing: Moderate assistance Upper Body Dressing : Set up Lower Body Dressing: Moderate assistance Toilet Transfer: Moderate assistance Toileting- Clothing Manipulation and Hygiene: Moderate assistance, Sitting/lateral lean, Sit to/from stand Functional mobility during ADLs: Minimal assistance, Moderate assistance, Rolling walker(ModA from bed to standing) General ADL Comments: Pt requires set-upA for UB ADL and ModA for LB ADL due to weakness in LLE. Pt performing sitting for grooming with set-upA.  Cognition: Cognition Overall Cognitive Status: Within Functional Limits for tasks assessed Orientation Level: Oriented X4 Cognition Arousal/Alertness: Awake/alert Behavior During Therapy: Anxious, WFL for tasks assessed/performed Overall Cognitive Status: Within Functional Limits for tasks assessed  Blood pressure (!) 154/75, pulse 66, temperature 97.6 F (36.4 C), temperature source Oral, resp. rate 13, height 5\' 8"  (1.727 m), weight 79.1 kg, SpO2 92 %. Physical Exam  Constitutional: She is oriented to person, place, and time. No distress.  HENT:  Head: Normocephalic and atraumatic.  Eyes: Pupils are equal, round,  and reactive to light. EOM are normal.  Neck: Normal range of motion.  Cardiovascular: Normal rate.  Respiratory: Effort normal.  GI: Soft.  Musculoskeletal:        General: Tenderness (LLE) and edema (LLE) present.  Neurological: She is alert and oriented to person, place, and time. No cranial nerve deficit.  Reasonable insight and awareness. Normal language. UE 4/5. RLE 3/5 Prox to 4/5 distally. LLE limited by pain: 1-2/5 HF, KE and 2 to 3/5 ADF/PF. Decreased LT toes of left foot.  Skin: She is not diaphoretic.  BPG incisions CDI, some surrounding erythema, toes of left foot red, warm. Tip of left 2nd toe sl blue.   Psychiatric: She has a normal mood and affect. Her behavior is normal. Judgment and thought content normal.    LabResultsLast24Hours  No results found for this or any previous visit (  from the past 24 hour(s)).    ImagingResults(Last48hours)  Vas [place] Burnard Bunting With/wo Tbi  Result Date: 10/23/2018 LOWER EXTREMITY DOPPLER STUDY Indications: Post peripheral bypass. High Risk Factors: Hypertension, coronary artery disease.  Performing Technologist: Maudry Mayhew MHA, RVT, RDCS, RDMS  Examination Guidelines: A complete evaluation includes at minimum, Doppler waveform signals and systolic blood pressure reading at the level of bilateral brachial, anterior tibial, and posterior tibial arteries, when vessel segments are accessible. Bilateral testing is considered an integral part of a complete examination. Photoelectric Plethysmograph (PPG) waveforms and toe systolic pressure readings are included as required and additional duplex testing as needed. Limited examinations for reoccurring indications may be performed as noted.  ABI Findings: +--------+------------------+-----+----------+--------+ Right   Rt Pressure (mmHg)IndexWaveform  Comment  +--------+------------------+-----+----------+--------+ [id number]                    triphasic           +--------+------------------+-----+----------+--------+ PTA     142               0.82 triphasic          +--------+------------------+-----+----------+--------+ DP      161               0.93 monophasic         +--------+------------------+-----+----------+--------+ +--------+------------------+-----+----------+-------+ Left    Lt Pressure (mmHg)IndexWaveform  Comment +--------+------------------+-----+----------+-------+ [id number]                    monophasic        +--------+------------------+-----+----------+-------+ PTA     92                0.53 monophasic        +--------+------------------+-----+----------+-------+ DP      114               0.66 monophasic        +--------+------------------+-----+----------+-------+ +-------+-----------+-----------+------------+------------+ ABI/TBIToday's ABIToday's TBIPrevious ABIPrevious TBI +-------+-----------+-----------+------------+------------+ Right  0.93                                           +-------+-----------+-----------+------------+------------+ Left   0.66                                           +-------+-----------+-----------+------------+------------+  Summary: Right: Resting right ankle-brachial index indicates mild right lower extremity arterial disease. Left: Resting left ankle-brachial index indicates moderate left lower extremity arterial disease.  *See table(s) above for measurements and observations.  Electronically signed by Curt Jews MD on 10/23/2018 at 3:08:56 PM.   Final       Assessment/Plan: Diagnosis: 73 yo female with baseline balance deficits from prior CVA who has PAD and required LLE BPG. Has ongoing sensory loss, pain, weakness in LLE 1. Does the need for close, 24 hr/day medical supervision in concert with the patient's rehab needs make it unreasonable for this patient to be served in a less intensive setting? Yes 2. Co-Morbidities requiring  supervision/potential complications: bipolar d/o, AAA, CVA, CAD, HTN 3. Due to bladder management, bowel management, safety, skin/wound care, disease management, medication administration, pain management and patient education, does the patient require 24 hr/day rehab nursing? Yes 4. Does the patient require coordinated care of a physician, rehab nurse, PT (1-2 hrs/day, 5 days/week)  and OT (1-2 hrs/day, 5 days/week) to address physical and functional deficits in the context of the above medical diagnosis(es)? Yes Addressing deficits in the following areas: balance, endurance, locomotion, strength, transferring, bowel/bladder control, bathing, dressing, feeding, grooming, toileting and psychosocial support 5. Can the patient actively participate in an intensive therapy program of at least 3 hrs of therapy per day at least 5 days per week? Yes 6. The potential for patient to make measurable gains while on inpatient rehab is excellent 7. Anticipated functional outcomes upon discharge from inpatient rehab are modified independent  with PT, modified independent with OT, n/a with SLP. 8. Estimated rehab length of stay to reach the above functional goals is: 7-10 days 9. Anticipated D/C setting: Home 10. Anticipated post D/C treatments: [place] therapy 11. Overall Rehab/Functional Prognosis: excellent  RECOMMENDATIONS: This patient's condition is appropriate for continued rehabilitative care in the following setting: CIR Patient has agreed to participate in recommended program. Yes Note that insurance prior authorization may be required for reimbursement for recommended care.  Comment: Rehab Admissions Coordinator to follow up.  Thanks,  Jenny Staggers, MD, Mellody Drown  I have personally performed a face to face diagnostic evaluation of this patient. Additionally, I have reviewed and concur with the physician assistant's documentation above.    [name] Angiulli, PA-C 10/24/2018        Revision  History                        Routing History

## 2018-10-24 NOTE — Consult Note (Signed)
Physical Medicine and Rehabilitation Consult Reason for Consult:  Decreased functional mobility Referring Physician: Dr.Fields   HPI: [name] is a 73 y.o.right handed female with history of bipolar disorder, AAA, CVA, CAD, hypertensionand tobacco abuse and peripheral vascular disease. Per chart review patient lives with spouse and independent  prior to admission. Two-level home with bedroom and bathroom downstairs and 2 steps to entry. Husband with history of dementia and limited assistance son from [place] is currently providing assistance. Presented 10/22/2018 followed by vascular surgery for long history of peripheral vascular disease noted claudication left lower extremity as well as an ulcer on her pretibial region times several months. Due to progressive rest pain to the left foot patient underwent left superficial femoral artery to below-knee popliteal artery bypass using non-reverse left greater saphenous vein 10/22/2018 per Dr.Fields. Hospital course pain management. Acute blood loss anemia 10.9. Subcutaneous Lovenox for DVT prophylaxis. Therapy evaluations completed with recommendations of physical medicine rehabilitation consult.   Review of Systems  Constitutional: Negative for chills and fever.  HENT: Negative for hearing loss.   Eyes: Negative for blurred vision and double vision.  Respiratory: Negative for cough and shortness of breath.   Cardiovascular: Positive for leg swelling. Negative for chest pain and palpitations.  Gastrointestinal: Positive for constipation. Negative for nausea and vomiting.       GERD  Genitourinary: Negative for dysuria, flank pain and hematuria.  Musculoskeletal: Positive for joint pain and myalgias.  Skin: Negative for rash.  Psychiatric/Behavioral: Positive for depression.       Bipolar disorder, anxiety  All other systems reviewed and are negative.  Past Medical History:  Diagnosis Date  . AAA (abdominal aortic aneurysm) (HCC)      3.1 cm 07/08/18, 3 year follow-up recommended; possible 3 cm AAA by aortogram 09/13/18  . Anemia    PMH  . Appendicitis with abscess    07/08/18, s/p perc drain; resolved 07/30/18 by CT  . Arthritis   . Bipolar disorder ([place])   . Cerebrovascular disease    intra and extracranial vascular dx per MRI 4/11, neurology rec strict CVRF control  . Colonic inertia   . Constipation    chronic;severe  . Coronary artery disease   . Depression   . Duodenitis   . EKG abnormalities    changes, stress test neg (false EKG changes)  . Gastritis   . GERD (gastroesophageal reflux disease)   . Hypertension   . Hypothyroid 01/16/2014  . Meningioma ([place]) 10/21/2013  . PAD (peripheral artery disease) ([place])   . Psoriasis    sees derm  . Stroke ([place])   . Wears dentures   . Wears glasses    Past Surgical History:  Procedure Laterality Date  . ABDOMINAL AORTOGRAM W/LOWER EXTREMITY N/A 09/13/2018   Procedure: ABDOMINAL AORTOGRAM W/LOWER EXTREMITY;  Surgeon: Elam Dutch, MD;  Location: [facility];  Service: Cardiovascular;  Laterality: N/A;  . arthroscopy  04/2010   Right knee  . CATARACT EXTRACTION W/ INTRAOCULAR LENS  IMPLANT, BILATERAL    . COLONOSCOPY    . FEMORAL-POPLITEAL BYPASS GRAFT  10/22/2018  . FEMORAL-POPLITEAL BYPASS GRAFT Left 10/22/2018   Procedure: LEFT FEMORAL TO BELOW THE KNEE POPLITEAL ARTERY BYPASS GRAFT;  Surgeon: Elam Dutch, MD;  Location: [facility];  Service: Vascular;  Laterality: Left;  . IR RADIOLOGIST EVAL & MGMT  07/30/2018  . MULTIPLE TOOTH EXTRACTIONS    . TUBAL LIGATION     Family History  Problem  Relation Age of Onset  . Breast cancer Mother        metastisis to bones  . Diabetes Son   . Heart disease Other        grandfather   . Alcohol abuse Brother   . Heart disease Brother   . Heart disease Maternal Aunt   . Lung cancer Brother        smoked  . Colon cancer Neg Hx    Social History:  reports that she has been smoking cigarettes. She has a  60.00 pack-year smoking history. She has never used smokeless tobacco. She reports current alcohol use. She reports that she does not use drugs. Allergies: No Known Allergies Medications Prior to Admission  Medication Sig Dispense Refill  . aspirin EC 81 MG tablet Take 81 mg by mouth daily.    . bisacodyl (DULCOLAX) 5 MG EC tablet Take 5-10 mg by mouth daily as needed (constipation.).     Marland Kitchen clonazePAM (KLONOPIN) 0.5 MG tablet Take 0.5mg  tab every bedtime and 1 tab PRN for anxiety (Patient taking differently: Take 0.5 mg by mouth at bedtime. ) 40 tablet 0  . DULoxetine (CYMBALTA) 60 MG capsule Take 60 mg by mouth daily.     . famotidine (PEPCID) 20 MG tablet One at bedtime (Patient taking differently: Take 20 mg by mouth daily as needed (nausea). One at bedtime) 30 tablet 11  . lamoTRIgine (LAMICTAL) 150 MG tablet Take 1 tablet (150 mg total) by mouth 2 (two) times daily. (Patient taking differently: Take 150 mg by mouth daily. ) 180 tablet 0  . linaclotide (LINZESS) 145 MCG CAPS capsule Take 1 capsule (145 mcg total) by mouth daily before breakfast. (Patient taking differently: Take 145 mcg by mouth at bedtime. ) 30 capsule 3  . magnesium citrate SOLN Take 1 Bottle by mouth daily as needed for severe constipation (once a week as needed for severe constipation).    . polyethylene glycol powder (GLYCOLAX/MIRALAX) powder Take 17 g by mouth every other day.    Marland Kitchen rOPINIRole (REQUIP) 2 MG tablet TAKE 1 TABLET BY MOUTH AT BEDTIME (Patient taking differently: Take 2 mg by mouth at bedtime. ) 30 tablet 0  . Sennosides 25 MG TABS Take 25 mg by mouth every other day. At night.    Marland Kitchen amLODipine (NORVASC) 10 MG tablet Take 1 tablet (10 mg total) by mouth daily. (Patient not taking: Reported on 10/15/2018) 30 tablet 4  . atorvastatin (LIPITOR) 40 MG tablet Take 1 tablet (40 mg total) by mouth daily. (Patient not taking: Reported on 10/15/2018) 30 tablet 5  . DULoxetine (CYMBALTA) 30 MG capsule Take 30 mg by mouth  every evening.     Marland Kitchen losartan (COZAAR) 50 MG tablet Take 1 tablet (50 mg total) by mouth daily. (Patient not taking: Reported on 10/15/2018) 30 tablet 4  . meclizine (ANTIVERT) 12.5 MG tablet Take 1 tablet (12.5 mg total) by mouth 3 (three) times daily. (Patient not taking: Reported on 10/15/2018) 90 tablet 2  . mupirocin ointment (BACTROBAN) 2 % APPLY EXTERNALLY TO THE AFFECTED AREA TWICE DAILY (Patient taking differently: Apply 1 application topically 2 (two) times daily as needed (for wound/infection.). ) 22 g 0    Home: Home Living Family/patient expects to be discharged to:: Private residence Living Arrangements: Alone Available Help at Discharge: Other (Comment) Type of Home: House Home Access: Stairs to enter Entrance Stairs-Number of Steps: 2 Home Layout: Two level, Bed/bath upstairs Alternate Level Stairs-Number of Steps: flight Alternate Level  Stairs-Rails: Can reach both Bathroom Shower/Tub: Multimedia programmer: Handicapped height Bathroom Accessibility: Yes Home Equipment: [place] in, Environmental consultant - 2 wheels, [employer] - single point  Functional History: Prior Function Level of Independence: Independent with assistive device(s) Comments: drove and lived alone, did her own housework Functional Status:  Mobility: Bed Mobility Overal bed mobility: Needs Assistance Bed Mobility: Sidelying to Sit, Supine to Sit Sidelying to sit: Min assist, HOB elevated Supine to sit: Min assist, HOB elevated General bed mobility comments: for LLE assisted Transfers Overall transfer level: Needs assistance Equipment used: Rolling walker (2 wheeled) Transfers: Sit to/from Stand Sit to Stand: Mod assist, From elevated surface General transfer comment: assist for power up Ambulation/Gait Ambulation/Gait assistance: Min assist Gait Distance (Feet): 35 Feet Assistive device: Rolling walker (2 wheeled), 1 person hand held assist Gait Pattern/deviations: Step-through pattern,  Step-to pattern, Wide base of support, Decreased weight shift to left, Decreased stride length General Gait Details: pt is in some pain with effort and WB on LLE Gait velocity: reduced Gait velocity interpretation: <1.31 ft/sec, indicative of household ambulator    ADL: ADL Overall ADL's : Needs assistance/impaired Eating/Feeding: Set up Grooming: Set up Upper Body Bathing: Set up Lower Body Bathing: Moderate assistance Upper Body Dressing : Set up Lower Body Dressing: Moderate assistance Toilet Transfer: Moderate assistance Toileting- Clothing Manipulation and Hygiene: Moderate assistance, Sitting/lateral lean, Sit to/from stand Functional mobility during ADLs: Minimal assistance, Moderate assistance, Rolling walker(ModA from bed to standing) General ADL Comments: Pt requires set-upA for UB ADL and ModA for LB ADL due to weakness in LLE. Pt performing sitting for grooming with set-upA.  Cognition: Cognition Overall Cognitive Status: Within Functional Limits for tasks assessed Orientation Level: Oriented X4 Cognition Arousal/Alertness: Awake/alert Behavior During Therapy: Anxious, WFL for tasks assessed/performed Overall Cognitive Status: Within Functional Limits for tasks assessed  Blood pressure (!) 154/75, pulse 66, temperature 97.6 F (36.4 C), temperature source Oral, resp. rate 13, height 5\' 8"  (1.727 m), weight 79.1 kg, SpO2 92 %. Physical Exam  Constitutional: She is oriented to person, place, and time. No distress.  HENT:  Head: Normocephalic and atraumatic.  Eyes: Pupils are equal, round, and reactive to light. EOM are normal.  Neck: Normal range of motion.  Cardiovascular: Normal rate.  Respiratory: Effort normal.  GI: Soft.  Musculoskeletal:        General: Tenderness (LLE) and edema (LLE) present.  Neurological: She is alert and oriented to person, place, and time. No cranial nerve deficit.  Reasonable insight and awareness. Normal language. UE 4/5. RLE 3/5  Prox to 4/5 distally. LLE limited by pain: 1-2/5 HF, KE and 2 to 3/5 ADF/PF. Decreased LT toes of left foot.  Skin: She is not diaphoretic.  BPG incisions CDI, some surrounding erythema, toes of left foot red, warm. Tip of left 2nd toe sl blue.   Psychiatric: She has a normal mood and affect. Her behavior is normal. Judgment and thought content normal.    No results found for this or any previous visit (from the past 24 hour(s)). Vas [place] Burnard Bunting With/wo Tbi  Result Date: 10/23/2018 LOWER EXTREMITY DOPPLER STUDY Indications: Post peripheral bypass. High Risk Factors: Hypertension, coronary artery disease.  Performing Technologist: Maudry Mayhew MHA, RVT, RDCS, RDMS  Examination Guidelines: A complete evaluation includes at minimum, Doppler waveform signals and systolic blood pressure reading at the level of bilateral brachial, anterior tibial, and posterior tibial arteries, when vessel segments are accessible. Bilateral testing is considered an integral part of  a complete examination. Photoelectric Plethysmograph (PPG) waveforms and toe systolic pressure readings are included as required and additional duplex testing as needed. Limited examinations for reoccurring indications may be performed as noted.  ABI Findings: +--------+------------------+-----+----------+--------+ Right   Rt Pressure (mmHg)IndexWaveform  Comment  +--------+------------------+-----+----------+--------+ [id number]                    triphasic          +--------+------------------+-----+----------+--------+ PTA     142               0.82 triphasic          +--------+------------------+-----+----------+--------+ DP      161               0.93 monophasic         +--------+------------------+-----+----------+--------+ +--------+------------------+-----+----------+-------+ Left    Lt Pressure (mmHg)IndexWaveform  Comment +--------+------------------+-----+----------+-------+ [id number]                     monophasic        +--------+------------------+-----+----------+-------+ PTA     92                0.53 monophasic        +--------+------------------+-----+----------+-------+ DP      114               0.66 monophasic        +--------+------------------+-----+----------+-------+ +-------+-----------+-----------+------------+------------+ ABI/TBIToday's ABIToday's TBIPrevious ABIPrevious TBI +-------+-----------+-----------+------------+------------+ Right  0.93                                           +-------+-----------+-----------+------------+------------+ Left   0.66                                           +-------+-----------+-----------+------------+------------+  Summary: Right: Resting right ankle-brachial index indicates mild right lower extremity arterial disease. Left: Resting left ankle-brachial index indicates moderate left lower extremity arterial disease.  *See table(s) above for measurements and observations.  Electronically signed by Curt Jews MD on 10/23/2018 at 3:08:56 PM.   Final      Assessment/Plan: Diagnosis: 73 yo female with baseline balance deficits from prior CVA who has PAD and required LLE BPG. Has ongoing sensory loss, pain, weakness in LLE 1. Does the need for close, 24 hr/day medical supervision in concert with the patient's rehab needs make it unreasonable for this patient to be served in a less intensive setting? Yes 2. Co-Morbidities requiring supervision/potential complications: bipolar d/o, AAA, CVA, CAD, HTN 3. Due to bladder management, bowel management, safety, skin/wound care, disease management, medication administration, pain management and patient education, does the patient require 24 hr/day rehab nursing? Yes 4. Does the patient require coordinated care of a physician, rehab nurse, PT (1-2 hrs/day, 5 days/week) and OT (1-2 hrs/day, 5 days/week) to address physical and functional deficits in the context of the above medical  diagnosis(es)? Yes Addressing deficits in the following areas: balance, endurance, locomotion, strength, transferring, bowel/bladder control, bathing, dressing, feeding, grooming, toileting and psychosocial support 5. Can the patient actively participate in an intensive therapy program of at least 3 hrs of therapy per day at least 5 days per week? Yes 6. The potential for patient to make measurable gains while on inpatient rehab is excellent 7. Anticipated  functional outcomes upon discharge from inpatient rehab are modified independent  with PT, modified independent with OT, n/a with SLP. 8. Estimated rehab length of stay to reach the above functional goals is: 7-10 days 9. Anticipated D/C setting: Home 10. Anticipated post D/C treatments: [place] therapy 11. Overall Rehab/Functional Prognosis: excellent  RECOMMENDATIONS: This patient's condition is appropriate for continued rehabilitative care in the following setting: CIR Patient has agreed to participate in recommended program. Yes Note that insurance prior authorization may be required for reimbursement for recommended care.  Comment: Rehab Admissions Coordinator to follow up.  Thanks,  [name], MD, Mellody Drown  I have personally performed a face to face diagnostic evaluation of this patient. Additionally, I have reviewed and concur with the physician assistant's documentation above.    [name] Angiulli, PA-C 10/24/2018

## 2018-10-24 NOTE — Care Management Note (Signed)
Case Management Note Marvetta Gibbons RN, BSN Transitions of Care Unit 4E- RN Case Manager [phone]  Patient Details  Name: [name] MRN: [id number] Date of Birth: 08/16/1946  Subjective/Objective:   Pt admitted s/p fempop                 Action/Plan: PTA pt lived at home with spouse (spouse has hx of dementia, son who is from [place] currently providing assistance). CIR consulted for possible admission. Also notified by Tiffany with [facility] that they have pre-op referral for any [facility] needs and will f/u.   Expected Discharge Date:  10/24/18               Expected Discharge Plan:  [place]  In-House Referral:  NA  Discharge planning Services  CM Consult  Post Acute Care Choice:  IP Rehab Choice offered to:  Patient  DME Arranged:    DME Agency:     HH Arranged:    [place] Agency:  [facility]  Status of Service:  Completed, signed off  If discussed at [place] of Stay Meetings, dates discussed:    Discharge Disposition: IP rehab   Additional Comments:  10/24/18- 1030- Asmaa Tirpak RN, CM- notified by Claiborne Billings with [facility] IP rehab - they are offering CIR bed to pt today, pt has been medically cleared for rehab. Plan to transition pt to rehab later today. CM has sent message to [place] with [facility] of plan for transition to [facility] IP rehab, they will f/u post rehab for any further [facility] needs.   Dawayne Patricia, RN 10/24/2018, 10:46 AM

## 2018-10-24 NOTE — Telephone Encounter (Signed)
sch appt lvm mld ltr 11/14/2018 330pm p/o MD

## 2018-10-24 NOTE — Progress Notes (Addendum)
[facility]  Subjective  - Doing well this am, better motivation.   Objective (!) 154/75 66 97.6 F (36.4 C) (Oral) 13 92%  Intake/Output Summary (Last 24 hours) at 10/24/2018 0734 Last data filed at 10/24/2018 0300 Gross per 24 hour  Intake 530 ml  Output 1050 ml  Net -520 ml    Doppler Peroneal/ PT/DP left LE Left below knee pop incision soft and healing well, left groin soft without hematoma. Lungs non labored breathing Heart RRR  Assessment/Planning: POD # 2 74 y.o. female is s/p:  Left superficial femoral artery to below-knee popliteal artery bypass using non-reversed left greater saphenous vein  Improved ABI's, with brisk doppler signals Patient ambulated yesterday with PT and nursing staff.  CIR recommended, but will observe mobility today to determine discharge planning.   Roxy Horseman 10/24/2018 7:34 AM -- Agree with above.  Pt approaching ready for d/c most likely tomorrow.  Ok for rehab tomorrow if bed available from my standpoint  Ruta Hinds, MD [facility] [place]: [phone] Pager: [phone]  Laboratory Lab Results: Recent Labs    10/21/18 1412 10/23/18 0010  WBC 7.2 8.4  HGB 13.6 10.9*  HCT 43.5 34.8*  PLT 242 211   BMET Recent Labs    10/21/18 1412 10/23/18 0010  NA 139 140  K 4.2 4.4  CL 100 100  CO2 31 33*  GLUCOSE 108* 96  BUN 13 12  CREATININE 0.93 0.96  CALCIUM 9.4 8.6*    COAG Lab Results  Component Value Date   INR 1.01 10/21/2018   INR 1.15 07/09/2018   INR 1.01 10/23/2017   No results found for: PTT

## 2018-10-24 NOTE — Discharge Instructions (Signed)
 [facility]  Discharge instructions  Lower Extremity Bypass Surgery  Please refer to the following instruction for your post-procedure care. Your surgeon or physician assistant will discuss any changes with you.  Activity  You are encouraged to walk as much as you can. You can slowly return to normal activities during the month after your surgery. Avoid strenuous activity and heavy lifting until your doctor tells you it's OK. Avoid activities such as vacuuming or swinging a golf club. Do not drive until your doctor give the OK and you are no longer taking prescription pain medications. It is also normal to have difficulty with sleep habits, eating and bowel movement after surgery. These will go away with time.  Bathing/Showering  You may shower after you go home. Do not soak in a bathtub, hot tub, or swim until the incision heals completely.  Incision Care  Clean your incision with mild soap and water. Shower every day. Pat the area dry with a clean towel. You do not need a bandage unless otherwise instructed. Do not apply any ointments or creams to your incision. If you have open wounds you will be instructed how to care for them or a visiting nurse may be arranged for you. If you have staples or sutures along your incision they will be removed at your post-op appointment. You may have skin glue on your incision. Do not peel it off. It will come off on its own in about one week. If you have a great deal of moisture in your groin, use a gauze help keep this area dry.  Diet  Resume your normal diet. There are no special food restrictions following this procedure. A low fat/ low cholesterol diet is recommended for all patients with vascular disease. In order to heal from your surgery, it is CRITICAL to get adequate nutrition. Your body requires vitamins, minerals, and protein. Vegetables are the best source of vitamins and minerals. Vegetables also provide the  perfect balance of protein. Processed food has little nutritional value, so try to avoid this.  Medications  Resume taking all your medications unless your doctor or nurse practitioner tells you not to. If your incision is causing pain, you may take over-the-counter pain relievers such as acetaminophen (Tylenol). If you were prescribed a stronger pain medication, please aware these medication can cause nausea and constipation. Prevent nausea by taking the medication with a snack or meal. Avoid constipation by drinking plenty of fluids and eating foods with high amount of fiber, such as fruits, vegetables, and grains. Take Colase 100 mg (an over-the-counter stool softener) twice a day as needed for constipation. Do not take Tylenol if you are taking prescription pain medications.  Follow Up  Our office will schedule a follow up appointment 2-3 weeks following discharge.  Please call us immediately for any of the following conditions  Severe or worsening pain in your legs or feet while at rest or while walking Increase pain, redness, warmth, or drainage (pus) from your incision site(s) Fever of 101 degree or higher The swelling in your leg with the bypass suddenly worsens and becomes more painful than when you were in the hospital If you have been instructed to feel your graft pulse then you should do so every day. If you can no longer feel this pulse, call the office immediately. Not all patients are given this instruction.  Leg swelling is common after leg bypass surgery.  The swelling should improve over a few months   following surgery. To improve the swelling, you may elevate your legs above the level of your heart while you are sitting or resting. Your surgeon or physician assistant may ask you to apply an ACE wrap or wear compression (TED) stockings to help to reduce swelling.  Reduce your risk of vascular disease  Stop smoking. If you would like help call [facility] at 1-800-QUIT-NOW  (1-800-784-8669) or [place] at 336-586-4000.  Manage your cholesterol Maintain a desired weight Control your diabetes weight Control your diabetes Keep your blood pressure down  If you have any questions, please call the office at [phone]   

## 2018-10-24 NOTE — Progress Notes (Signed)
PMR Admission Coordinator Pre-Admission Assessment  Patient: [name] is an 73 y.o., female MRN: [id number] DOB: May 16, 1946 Height: _0  (172.7 cm) Weight: 79.1 kg                                                                                                                                                  Insurance Information HMO:     PPO:      PCP:      IPA:      80/20: Yes      OTHER:  PRIMARY: Medicare A and B      Policy#: [id number]      Subscriber: Patient CM Name:       Phone#:      Fax#:  Pre-Cert#:       Employer:  Benefits:  Phone #: NA     Name: Verified eligibility via [employer] on 10/24/18 Eff. Date: Part A: 10/12/08; part B: 10/12/08     Deduct: $1,408.00      Out of Pocket Max: NA      Life Max: NA CIR: Covered per Medicare guidelines once yearly deductible is met      SNF: days 1-20, 100%; days 21-100, 80% Outpatient: 80%     Co-Pay: 20% Home Health: 100%      Co-Pay:  DME: 80%     Co-Pay: 20% Providers: Pt's choice  SECONDARY: BCBS      Policy#: [id number]      Subscriber: Patient CM Name:       Phone#:      Fax#:  Pre-Cert#:       Employer:  Benefits:  Phone #: [phone]     Name:  Eff. Date:      Deduct:       Out of Pocket Max:       Life Max:  CIR:       SNF:  Outpatient:      Co-Pay:  Home Health:       Co-Pay:  DME:      Co-Pay:   Medicaid Application Date:       Case Manager:  Disability Application Date:       Case Worker:   Emergency Contact Information         Contact Information    Name Relation Home Work Mobile   Lakeline Spouse [phone]   [place] Son [phone]       Current Medical History  Patient Admitting Diagnosis: 73 yo female with baseline balance deficits from prior CVA who has PAD and required LLE BPG. Has ongoing sensory loss, pain, weakness in LLE  History of Present Illness: [name] is a 73 year old female with history of bipolar disorder, AAA, CVA, CAD,  hypertension and tobacco abuse as well as peripheral vascular disease.Pt was noted to have claudication of left lower  extremity as well as ulcer of her pretibial region. Due to progressive rest pain to the left foot, the pt was admitted on 10/22/2018 and underwent a left superficial femoral artery to below-knee popliteal artery bypass using non-reverse left greater saphenous vein with Dr. Oneida Alar. Therapy evaluations completed with recommendations for CIR. Pt is to be admitted to CIR on 10/24/18.    Past Medical History      Past Medical History:  Diagnosis Date  . AAA (abdominal aortic aneurysm) (HCC)    3.1 cm 07/08/18, 3 year follow-up recommended; possible 3 cm AAA by aortogram 09/13/18  . Anemia    PMH  . Appendicitis with abscess    07/08/18, s/p perc drain; resolved 07/30/18 by CT  . Arthritis   . Bipolar disorder ([place])   . Cerebrovascular disease    intra and extracranial vascular dx per MRI 4/11, neurology rec strict CVRF control  . Colonic inertia   . Constipation    chronic;severe  . Coronary artery disease   . Depression   . Duodenitis   . EKG abnormalities    changes, stress test neg (false EKG changes)  . Gastritis   . GERD (gastroesophageal reflux disease)   . Hypertension   . Hypothyroid 01/16/2014  . Meningioma ([place]) 10/21/2013  . PAD (peripheral artery disease) ([place])   . Psoriasis    sees derm  . Stroke ([place])   . Wears dentures   . Wears glasses     Family History  family history includes Alcohol abuse in her brother; Breast cancer in her mother; Diabetes in her son; Heart disease in her brother, maternal aunt, and another family member; Lung cancer in her brother.  Prior Rehab/Hospitalizations:  Has the patient had major surgery during 100 days prior to admission? No  Current Medications   Current Facility-Administered Medications:  .  0.9 %  sodium chloride infusion, 500 mL, Intravenous, Once PRN, Rhyne, Samantha J, PA-C .   0.9 %  sodium chloride infusion, , Intravenous, Continuous, Rhyne, Samantha J, PA-C, Stopped at 10/23/18 0225 .  acetaminophen (TYLENOL) tablet 325-650 mg, 325-650 mg, Oral, Q4H PRN **OR** acetaminophen (TYLENOL) suppository 325-650 mg, 325-650 mg, Rectal, Q4H PRN, Rhyne, Samantha J, PA-C .  alum & mag hydroxide-simeth (MAALOX/MYLANTA) 200-200-20 MG/5ML suspension 15-30 mL, 15-30 mL, Oral, Q2H PRN, Rhyne, Samantha J, PA-C .  aspirin EC tablet 81 mg, 81 mg, Oral, Daily, Rhyne, Samantha J, PA-C, 81 mg at 10/24/18 0943 .  bisacodyl (DULCOLAX) EC tablet 5-10 mg, 5-10 mg, Oral, Daily PRN, Rhyne, Samantha J, PA-C .  bisacodyl (DULCOLAX) suppository 10 mg, 10 mg, Rectal, Daily PRN, Rhyne, Samantha J, PA-C .  clonazePAM (KLONOPIN) tablet 0.5 mg, 0.5 mg, Oral, QHS, Rhyne, Samantha J, PA-C, 0.5 mg at 10/23/18 2015 .  docusate sodium (COLACE) capsule 100 mg, 100 mg, Oral, Daily, Rhyne, Samantha J, PA-C, 100 mg at 10/24/18 0943 .  DULoxetine (CYMBALTA) DR capsule 30 mg, 30 mg, Oral, QPM, Rhyne, Samantha J, PA-C, 30 mg at 10/23/18 1827 .  DULoxetine (CYMBALTA) DR capsule 60 mg, 60 mg, Oral, Daily, Rhyne, Samantha J, PA-C, 60 mg at 10/24/18 0943 .  enoxaparin (LOVENOX) injection 30 mg, 30 mg, Subcutaneous, Q24H, Rhyne, Samantha J, PA-C, 30 mg at 10/24/18 0944 .  guaiFENesin-dextromethorphan (ROBITUSSIN DM) 100-10 MG/5ML syrup 15 mL, 15 mL, Oral, Q4H PRN, Rhyne, Samantha J, PA-C .  hydrALAZINE (APRESOLINE) injection 5 mg, 5 mg, Intravenous, Q20 Min PRN, Rhyne, Samantha J, PA-C .  labetalol (NORMODYNE,TRANDATE) injection 10 mg, 10  mg, Intravenous, Q10 min PRN, Rhyne, Samantha J, PA-C .  lamoTRIgine (LAMICTAL) tablet 150 mg, 150 mg, Oral, Daily, Rhyne, Samantha J, PA-C, 150 mg at 10/24/18 0943 .  linaclotide (LINZESS) capsule 145 mcg, 145 mcg, Oral, QHS, Rhyne, Hulen Shouts, PA-C, 145 mcg at 10/23/18 2021 .  magnesium citrate solution 0.5 Bottle, 0.5 Bottle, Oral, Daily PRN, Rhyne, Samantha J, PA-C .  magnesium  sulfate IVPB 2 g 50 mL, 2 g, Intravenous, Daily PRN, Rhyne, Samantha J, PA-C .  metoprolol tartrate (LOPRESSOR) injection 2-5 mg, 2-5 mg, Intravenous, Q2H PRN, Rhyne, Samantha J, PA-C .  morphine 2 MG/ML injection 2 mg, 2 mg, Intravenous, Q2H PRN, Rhyne, Samantha J, PA-C, 2 mg at 10/23/18 0827 .  mupirocin ointment (BACTROBAN) 2 % 1 application, 1 application, Topical, BID PRN, Rhyne, Samantha J, PA-C .  ondansetron (ZOFRAN) injection 4 mg, 4 mg, Intravenous, Q6H PRN, Rhyne, Samantha J, PA-C .  oxyCODONE-acetaminophen (PERCOCET/ROXICET) 5-325 MG per tablet 1-2 tablet, 1-2 tablet, Oral, Q4H PRN, Rhyne, Samantha J, PA-C, 1 tablet at 10/24/18 0636 .  pantoprazole (PROTONIX) EC tablet 40 mg, 40 mg, Oral, Daily, Rhyne, Samantha J, PA-C, 40 mg at 10/24/18 0943 .  phenol (CHLORASEPTIC) mouth spray 1 spray, 1 spray, Mouth/Throat, PRN, Rhyne, Samantha J, PA-C .  polyethylene glycol (MIRALAX / GLYCOLAX) packet 17 g, 17 g, Oral, QODAY, Rhyne, Samantha J, PA-C, 17 g at 10/23/18 0817 .  potassium chloride SA (K-DUR,KLOR-CON) CR tablet 20-40 mEq, 20-40 mEq, Oral, Daily PRN, Rhyne, Samantha J, PA-C .  rOPINIRole (REQUIP) tablet 2 mg, 2 mg, Oral, QHS, Rhyne, Samantha J, PA-C, 2 mg at 10/23/18 2014 .  senna (SENOKOT) tablet 17.2 mg, 17.2 mg, Oral, Q48H, Rhyne, Samantha J, PA-C, 17.2 mg at 10/23/18 2015  Patients Current Diet:     Diet Order                  Diet regular Room service appropriate? Yes; Fluid consistency: Thin  Diet effective now               Precautions / Restrictions Precautions Precautions: Fall Restrictions Weight Bearing Restrictions: Yes LLE Weight Bearing: Weight bearing as tolerated LLE Partial Weight Bearing Percentage or Pounds: 75   Has the patient had 2 or more falls or a fall with injury in the past year?No  Prior Activity Level Limited Community (1-2x/wk): retired; drove PTA, mostly active around the Programmer, applications / [place] Devices/Equipment: Environmental consultant (specify type) Home Equipment: Shower seat - built in, Environmental consultant - 2 wheels, [place] - single point  Prior Device Use: Indicate devices/aids used by the patient prior to current illness, exacerbation or injury? None of the above  Prior Functional Level Prior Function Level of Independence: Independent with assistive device(s) Comments: drove and lived alone, did her own housework  Self Care: Did the patient need help bathing, dressing, using the toilet or eating?  Independent  Indoor Mobility: Did the patient need assistance with walking from room to room (with or without device)? Independent  Stairs: Did the patient need assistance with internal or external stairs (with or without device)? Independent  Functional Cognition: Did the patient need help planning regular tasks such as shopping or remembering to take medications? Independent  Current Functional Level Cognition  Overall Cognitive Status: Within Functional Limits for tasks assessed Orientation Level: Oriented X4    Extremity Assessment (includes Sensation/Coordination)  Upper Extremity Assessment: Generalized weakness  Lower Extremity Assessment: Generalized weakness LLE Deficits / Details:  s/p recent sx    ADLs  Overall ADL's : Needs assistance/impaired Eating/Feeding: Set up Grooming: Set up Upper Body Bathing: Set up Lower Body Bathing: Moderate assistance Upper Body Dressing : Set up Lower Body Dressing: Moderate assistance Toilet Transfer: Moderate assistance Toileting- Clothing Manipulation and Hygiene: Moderate assistance, Sitting/lateral lean, Sit to/from stand Functional mobility during ADLs: Minimal assistance, Moderate assistance, Rolling walker(ModA from bed to standing) General ADL Comments: Pt requires set-upA for UB ADL and ModA for LB ADL due to weakness in LLE. Pt performing sitting for grooming with set-upA.    Mobility  Overal bed mobility: Needs  Assistance Bed Mobility: Sidelying to Sit, Supine to Sit Sidelying to sit: Min assist, HOB elevated Supine to sit: Min assist, HOB elevated General bed mobility comments: for LLE assisted    Transfers  Overall transfer level: Needs assistance Equipment used: Rolling walker (2 wheeled) Transfers: Sit to/from Stand Sit to Stand: Mod assist, From elevated surface General transfer comment: assist for power up    Ambulation / Gait / Stairs / Wheelchair Mobility  Ambulation/Gait Ambulation/Gait assistance: Herbalist (Feet): 35 Feet Assistive device: Rolling walker (2 wheeled), 1 person hand held assist Gait Pattern/deviations: Step-through pattern, Step-to pattern, Wide base of support, Decreased weight shift to left, Decreased stride length General Gait Details: pt is in some pain with effort and WB on LLE Gait velocity: reduced Gait velocity interpretation: <1.31 ft/sec, indicative of household ambulator    Posture / Balance Balance Overall balance assessment: Needs assistance Sitting-balance support: Feet supported, Bilateral upper extremity supported Sitting balance-Leahy Scale: Good Standing balance support: Bilateral upper extremity supported, During functional activity Standing balance-Leahy Scale: Poor Standing balance comment: needs extra time and assistance to gather her balance with walker    Special needs/care consideration BiPAP/CPAP: no  CPM: no Continuous Drip IV: 0.9% sodium chloride infusion  Dialysis: no        Days: no Life Vest: no Oxygen: no Special Bed: no Trach Size: no Wound Vac (area): no      Location: no Skin: surgical incision on Left leg Bowel mgmt: continent, 10/23/18 Bladder mgmt: continent Diabetic mgmt: no     Previous Home Environment Living Arrangements: Alone Available Help at Discharge: Other (Comment) Type of Home: House Home Layout: Two level, Bed/bath upstairs Alternate Level Stairs-Rails: Can reach  both Alternate Level Stairs-Number of Steps: flight Home Access: Stairs to enter Technical brewer of Steps: 2 Bathroom Shower/Tub: Multimedia programmer: Handicapped height Bathroom Accessibility: Yes Home Care Services: No  Discharge Living Setting Plans for Discharge Living Setting: Patient's home, Lives with (comment)(husband; will have nephew there for a month) Type of Home at Discharge: House Discharge Home Layout: Two level, Bed/bath upstairs Alternate Level Stairs-Rails: Can reach both Alternate Level Stairs-Number of Steps: full flight Discharge Home Access: Stairs to enter Entrance Stairs-Rails: None Entrance Stairs-Number of Steps: 2 Discharge Bathroom Shower/Tub: Walk-in shower Discharge Bathroom Toilet: Handicapped height Discharge Bathroom Accessibility: Yes How Accessible: Accessible via walker((may need walker downstairs and upstairs to accomodate)) Does the patient have any problems obtaining your medications?: No  Social/Family/Support Systems Patient Roles: Spouse(husband has Alzheimer's but self sufficient still) Contact Information: husband [name]): [phone]; [phone]; [phone]; son [name]): [phone] Anticipated Caregiver: husband and nephew (will be there for 1 month) Anticipated Caregiver's Contact Information: see above Ability/Limitations of Caregiver: supervision  Caregiver Availability: Other (Comment)(should be close to 24/7 as husband retired at home) Discharge Plan Discussed with Primary Caregiver: Yes(per pt, she will discuss with husband) Is  Caregiver In Agreement with Plan?: Yes(per wife) Does Caregiver/Family have Issues with Lodging/Transportation while Pt is in Rehab?: No   Goals/Additional Needs Patient/Family Goal for Rehab: PT/OT: Mod I; SLP: NA Expected length of stay: 7-10 days Cultural Considerations: Catholic  Dietary Needs: regular diet, thin liquids Equipment Needs: TBD Pt/Family Agrees to  Admission and willing to participate: Yes Program Orientation Provided & Reviewed with Pt/Caregiver Including Roles  & Responsibilities: Yes(with pt who plans to communicate with husband/family)  Barriers to Discharge: Home environment access/layout  Barriers to Discharge Comments: stairs to enter home; full flight of stairs to reach bedroom/bathroom   Decrease burden of Care through IP rehab admission: NA   Possible need for SNF placement upon discharge: Not anticipated; pt has good social support at home from husband who can provide supervision and from her nephew who will be at her home for approximately 1 month. Pt has an excellent prognosis for further progress through CIR.    Patient Condition: This patient's condition remains as documented in the consult dated 10/24/18, in which the Rehabilitation Physician determined and documented that the patient's condition is appropriate for intensive rehabilitative care in an inpatient rehabilitation facility. Will admit to inpatient rehab today.  Preadmission Screen Completed By:  Jhonnie Garner, 10/24/2018 10:49 AM ______________________________________________________________________   Discussed status with Dr. Letta Pate on 10/24/18 at 10:50AM and received telephone approval for admission today.  Admission Coordinator:  Jhonnie Garner, time 10:50AM/Date 10/24/18           Cosigned by: Charlett Blake, MD at 10/24/2018 10:54 AM  Revision History

## 2018-10-24 NOTE — Progress Notes (Signed)
Physical Therapy Treatment Patient Details Name: [name] MRN: [id number] DOB: [birth year] Today's Date: 10/24/2018    History of Present Illness Pt is a 73 yo female s/p L superficial femoral a. below the knee bypass using on reversal L greater spahenous vein. 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 Session focused on gait training. Pt ambulating 30 feet with walker and min guard assist. Continues to display decreased functional mobility due to gait abnormalities, pain, and decreased activity tolerance. D/c plan remains appropriate.     Follow Up Recommendations  CIR     Equipment Recommendations  Rolling walker with 5" wheels    Recommendations for Other Services       Precautions / Restrictions Precautions Precautions: Fall Restrictions Weight Bearing Restrictions: Yes LLE Weight Bearing: Weight bearing as tolerated    Mobility  Bed Mobility Overal bed mobility: Needs Assistance Bed Mobility: Sit to Supine     Supine to sit: Min assist     General bed mobility comments: light min assist for adjusting LLE  Transfers Overall transfer level: Needs assistance Equipment used: Rolling walker (2 wheeled) Transfers: Sit to/from Stand Sit to Stand: Min assist Stand pivot transfers: Min assist       General transfer comment: Requiring minA to boost up from bed surface, able to stand up without physical assistance from toilet riser  Ambulation/Gait Ambulation/Gait assistance: Min guard Gait Distance (Feet): 30 Feet Assistive device: Rolling walker (2 wheeled) Gait Pattern/deviations: Step-through pattern;Step-to pattern;Decreased weight shift to left;Decreased stride length;Decreased step length - right Gait velocity: reduced Gait velocity interpretation: <1.8 ft/sec, indicate of risk for recurrent falls General  Gait Details: Tactile/verbal cues for upright posture, decreased left step length and step through pattern. Pt not able to maintain step through gait pattern   Stairs             Wheelchair Mobility    Modified Rankin (Stroke Patients Only)       Balance Overall balance assessment: Needs assistance   Sitting balance-Leahy Scale: Good     Standing balance support: Bilateral upper extremity supported;During functional activity Standing balance-Leahy Scale: Poor                              Cognition Arousal/Alertness: Awake/alert Behavior During Therapy: Anxious;Impulsive Overall Cognitive Status: Impaired/Different from baseline Area of Impairment: Safety/judgement                         Safety/Judgement: Decreased awareness of safety            Exercises      General Comments        Pertinent Vitals/Pain Pain Assessment: Faces Faces Pain Scale: Hurts even more Pain Location: L LE Pain Descriptors / Indicators: Grimacing;Guarding;Sore;Operative site guarding Pain Intervention(s): Monitored during session    Home Living                      Prior Function            PT Goals (current goals can now be found in the care plan section) Acute Rehab PT Goals Patient Stated Goal: to go home safely Potential to Achieve Goals: Good Progress towards PT goals: Progressing toward goals    Frequency    Min  4X/week      PT Plan Current plan remains appropriate    Co-evaluation              AM-PAC PT "6 Clicks" Mobility   Outcome Measure  Help needed turning from your back to your side while in a flat bed without using bedrails?: A Little Help needed moving from lying on your back to sitting on the side of a flat bed without using bedrails?: A Little Help needed moving to and from a bed to a chair (including a wheelchair)?: A Little Help needed standing up from a chair using your arms (e.g., wheelchair or  bedside chair)?: A Little Help needed to walk in hospital room?: A Little Help needed climbing 3-5 steps with a railing? : Total 6 Click Score: 16    End of Session Equipment Utilized During Treatment: Gait belt Activity Tolerance: Patient tolerated treatment well Patient left: in bed;with call bell/phone within reach;with family/visitor present Nurse Communication: Mobility status PT Visit Diagnosis: Unsteadiness on feet (R26.81);Muscle weakness (generalized) (M62.81);Difficulty in walking, not elsewhere classified (R26.2);Pain Pain - Right/Left: Left Pain - part of body: Leg;Knee;Hip     Time: 8159-4707 PT Time Calculation (min) (ACUTE ONLY): 25 min  Charges:  $Gait Training: 8-22 mins $Therapeutic Activity: 8-22 mins                     Ellamae Sia, PT, DPT Acute Rehabilitation Services Pager [phone] Office [phone]    Willy Eddy 10/24/2018, 1:41 PM

## 2018-10-24 NOTE — Discharge Summary (Signed)
[facility] Discharge Summary   Patient ID:  [name] MRN: [id number] DOB/AGE: 1945/10/11 73 y.o.  Admit date: 10/22/2018 Discharge date: 10/24/2018 Date of Surgery: 10/22/2018 Surgeon: Surgeon(s): Fields, Jessy Oto, MD  Admission Diagnosis: PERIPHERAL VASCULAR DISEASE  Discharge Diagnoses:  PERIPHERAL VASCULAR DISEASE  Secondary Diagnoses: Past Medical History:  Diagnosis Date  . AAA (abdominal aortic aneurysm) (HCC)    3.1 cm 07/08/18, 3 year follow-up recommended; possible 3 cm AAA by aortogram 09/13/18  . Anemia    PMH  . Appendicitis with abscess    07/08/18, s/p perc drain; resolved 07/30/18 by CT  . Arthritis   . Bipolar disorder ([place])   . Cerebrovascular disease    intra and extracranial vascular dx per MRI 4/11, neurology rec strict CVRF control  . Colonic inertia   . Constipation    chronic;severe  . Coronary artery disease   . Depression   . Duodenitis   . EKG abnormalities    changes, stress test neg (false EKG changes)  . Gastritis   . GERD (gastroesophageal reflux disease)   . Hypertension   . Hypothyroid 01/16/2014  . Meningioma ([place]) 10/21/2013  . PAD (peripheral artery disease) ([place])   . Psoriasis    sees derm  . Stroke ([place])   . Wears dentures   . Wears glasses     Procedure(s): LEFT FEMORAL TO BELOW THE KNEE POPLITEAL ARTERY BYPASS GRAFT  Discharged Condition: stable  HPI:  Patient is a 73 year old female who returns for follow-up today. She was last seen March 15, 2018. At that time she had very short distance claudication in her left leg. She was offered an arteriogram but declined at that point and opted for a walking program. She states she is still having difficulty with her left leg. She experiences claudication symptoms at about 1/2-1 block. She also has some early symptoms of rest pain in the left foot. However she does have an ulcer on her pretibial region which she states is been present for several months.  Pre-op ABI: Left side was 0.31 right side was 0.97  Plan for left fem-pop bypass  Hospital Course:  [name] is a 73 y.o. female is S/P Left Procedure(s): LEFT FEMORAL TO BELOW THE KNEE POPLITEAL ARTERY BYPASS GRAFT  Post op 3 vessel doppler signal with improved ABI.  Difficulty with mobility and it was suggested that she be transferred to CIR for rehab.  Incision healing well, groin soft without hematoma.   Significant Diagnostic Studies: CBC Lab Results  Component Value Date   WBC 8.4 10/23/2018   HGB 10.9 (L) 10/23/2018   HCT 34.8 (L) 10/23/2018   MCV 93.8 10/23/2018   PLT 211 10/23/2018    BMET    Component Value Date/Time   NA 140 10/23/2018 0010   NA 139 03/05/2018 1758   K 4.4 10/23/2018 0010   CL 100 10/23/2018 0010   CO2 33 (H) 10/23/2018 0010   GLUCOSE 96 10/23/2018 0010   BUN 12 10/23/2018 0010   BUN 13 03/05/2018 1758   CREATININE 0.96 10/23/2018 0010   CREATININE 0.77 07/28/2015 1805   CALCIUM 8.6 (L) 10/23/2018 0010   GFRNONAA 58 (L) 10/23/2018 0010   GFRAA >60 10/23/2018 0010   COAG Lab Results  Component Value Date   INR 1.01 10/21/2018   INR 1.15 07/09/2018   INR 1.01 10/23/2017     Disposition:  Discharge to :Rehab Discharge Instructions    Call MD for:  redness, tenderness,  or signs of infection (pain, swelling, bleeding, redness, odor or green/yellow discharge around incision site)   Complete by:  As directed    Call MD for:  severe or increased pain, loss or decreased feeling  in affected limb(s)   Complete by:  As directed    Call MD for:  temperature >100.5   Complete by:  As directed    Resume previous diet   Complete by:  As directed      Allergies as of 10/24/2018   No Known Allergies     Medication List    TAKE these medications   amLODipine 10 MG tablet Commonly known as:  NORVASC Take 1 tablet (10 mg total) by mouth daily.   aspirin EC 81 MG tablet Take 81 mg by mouth daily.   atorvastatin 40 MG  tablet Commonly known as:  LIPITOR Take 1 tablet (40 mg total) by mouth daily.   bisacodyl 5 MG EC tablet Commonly known as:  DULCOLAX Take 5-10 mg by mouth daily as needed (constipation.).   clonazePAM 0.5 MG tablet Commonly known as:  KLONOPIN Take 0.5mg  tab every bedtime and 1 tab PRN for anxiety What changed:    how much to take  how to take this  when to take this  additional instructions   DULoxetine 60 MG capsule Commonly known as:  CYMBALTA Take 60 mg by mouth daily.   DULoxetine 30 MG capsule Commonly known as:  CYMBALTA Take 30 mg by mouth every evening.   famotidine 20 MG tablet Commonly known as:  PEPCID One at bedtime What changed:    how much to take  how to take this  when to take this  reasons to take this   lamoTRIgine 150 MG tablet Commonly known as:  LAMICTAL Take 1 tablet (150 mg total) by mouth 2 (two) times daily. What changed:  when to take this   linaclotide 145 MCG Caps capsule Commonly known as:  LINZESS Take 1 capsule (145 mcg total) by mouth daily before breakfast. What changed:  when to take this   losartan 50 MG tablet Commonly known as:  COZAAR Take 1 tablet (50 mg total) by mouth daily.   magnesium citrate Soln Take 1 Bottle by mouth daily as needed for severe constipation (once a week as needed for severe constipation).   meclizine 12.5 MG tablet Commonly known as:  ANTIVERT Take 1 tablet (12.5 mg total) by mouth 3 (three) times daily.   mupirocin ointment 2 % Commonly known as:  BACTROBAN APPLY EXTERNALLY TO THE AFFECTED AREA TWICE DAILY What changed:  See the new instructions.   polyethylene glycol powder powder Commonly known as:  GLYCOLAX/MIRALAX Take 17 g by mouth every other day.   rOPINIRole 2 MG tablet Commonly known as:  REQUIP TAKE 1 TABLET BY MOUTH AT BEDTIME   Sennosides 25 MG Tabs Take 25 mg by mouth every other day. At night.      Verbal and written Discharge instructions given to the  patient. Wound care per Discharge AVS Follow-up Information    Elam Dutch, MD Follow up in 3 week(s).   Specialties:  Vascular Surgery, Cardiology Why:  office will call for f/u  Contact information: [place] [phone]           Signed: Roxy Horseman 10/24/2018, 10:18 AM - For VQI Registry use --- Instructions: Press F2 to tab through selections.  Delete question if not applicable.   Post-op:  Wound  infection: No  Graft infection: No  Transfusion: No  If yes, 0 units given New Arrhythmia: No Ipsilateral amputation: [x ] no, [ ]  Minor, [ ]  BKA, [ ]  AKA Discharge patency: [x ] Primary, [ ]  Primary assisted, [ ]  Secondary, [ ]  Occluded Patency judged by: [x ] Dopper only, [ ]  Palpable graft pulse, [ ]  Palpable distal pulse, [ ]  ABI inc. > 0.15, [ ]  Duplex Discharge ABI: R 0.93, L 0.66  D/C Ambulatory Status: Ambulatory with Assistance  Complications: MI: [x ] No, [ ]  Troponin only, [ ]  EKG or Clinical CHF: No Resp failure: [x ] none, [ ]  Pneumonia, [ ]  Ventilator Chg in renal function: [x ] none, [ ]  Inc. Cr > 0.5, [ ]  Temp. Dialysis, [ ]  Permanent dialysis Stroke: [x ] None, [ ]  Minor, [ ]  Major Return to OR: No  Reason for return to OR: [ ]  Bleeding, [ ]  Infection, [ ]  Thrombosis, [ ]  Revision  Discharge medications: Statin use:  Yes ASA use:  Yes Plavix use:  No  for medical reason   Beta blocker use: No  for medical reason   Coumadin use: No  for medical reason

## 2018-10-24 NOTE — PMR Pre-admission (Signed)
PMR Admission Coordinator Pre-Admission Assessment  Patient: [name] is an 73 y.o., female MRN: [id number] DOB: June 28, 1946 Height: '5\' 8"'  (172.7 cm) Weight: 79.1 kg              Insurance Information HMO:     PPO:      PCP:      IPA:      80/20: Yes      OTHER:  PRIMARY: Medicare A and B      Policy#: [id number]      Subscriber: Patient CM Name:       Phone#:      Fax#:  Pre-Cert#:       Employer:  Benefits:  Phone #: NA     Name: Verified eligibility via [employer] on 10/24/18 Eff. Date: Part A: 10/12/08; part B: 10/12/08     Deduct: $1,408.00      Out of Pocket Max: NA      Life Max: NA CIR: Covered per Medicare guidelines once yearly deductible is met      SNF: days 1-20, 100%; days 21-100, 80% Outpatient: 80%     Co-Pay: 20% Home Health: 100%      Co-Pay:  DME: 80%     Co-Pay: 20% Providers: Pt's choice  SECONDARY: BCBS      Policy#: [id number]      Subscriber: Patient CM Name:       Phone#:      Fax#:  Pre-Cert#:       Employer:  Benefits:  Phone #: [phone]     Name:  Eff. Date:      Deduct:       Out of Pocket Max:       Life Max:  CIR:       SNF:  Outpatient:      Co-Pay:  Home Health:       Co-Pay:  DME:      Co-Pay:   Medicaid Application Date:       Case Manager:  Disability Application Date:       Case Worker:   Emergency Contact Information Contact Information    Name Relation Home Work Mobile   [place] Spouse [phone]   [place] Son [phone]       Current Medical History  Patient Admitting Diagnosis: 73 yo female with baseline balance deficits from prior CVA who has PAD and required LLE BPG. Has ongoing sensory loss, pain, weakness in LLE  History of Present Illness: [name] is a 73 year old female with history of bipolar disorder, AAA, CVA, CAD, hypertension and tobacco abuse as well as peripheral vascular disease.Pt was noted to have claudication of left lower extremity as well as ulcer of her  pretibial region. Due to progressive rest pain to the left foot, the pt was admitted on 10/22/2018 and underwent a left superficial femoral artery to below-knee popliteal artery bypass using non-reverse left greater saphenous vein with Dr. Oneida Alar. Therapy evaluations completed with recommendations for CIR. Pt is to be admitted to CIR on 10/24/18.        Past Medical History  Past Medical History:  Diagnosis Date  . AAA (abdominal aortic aneurysm) (HCC)    3.1 cm 07/08/18, 3 year follow-up recommended; possible 3 cm AAA by aortogram 09/13/18  . Anemia    PMH  . Appendicitis with abscess    07/08/18, s/p perc drain; resolved 07/30/18 by CT  . Arthritis   . Bipolar disorder ([place])   . Cerebrovascular disease  intra and extracranial vascular dx per MRI 4/11, neurology rec strict CVRF control  . Colonic inertia   . Constipation    chronic;severe  . Coronary artery disease   . Depression   . Duodenitis   . EKG abnormalities    changes, stress test neg (false EKG changes)  . Gastritis   . GERD (gastroesophageal reflux disease)   . Hypertension   . Hypothyroid 01/16/2014  . Meningioma ([place]) 10/21/2013  . PAD (peripheral artery disease) ([place])   . Psoriasis    sees derm  . Stroke ([place])   . Wears dentures   . Wears glasses     Family History  family history includes Alcohol abuse in her brother; Breast cancer in her mother; Diabetes in her son; Heart disease in her brother, maternal aunt, and another family member; Lung cancer in her brother.  Prior Rehab/Hospitalizations:  Has the patient had major surgery during 100 days prior to admission? No  Current Medications   Current Facility-Administered Medications:  .  0.9 %  sodium chloride infusion, 500 mL, Intravenous, Once PRN, Rhyne, Samantha J, PA-C .  0.9 %  sodium chloride infusion, , Intravenous, Continuous, Rhyne, Samantha J, PA-C, Stopped at 10/23/18 0225 .  acetaminophen (TYLENOL) tablet 325-650 mg, 325-650 mg, Oral, Q4H PRN  **OR** acetaminophen (TYLENOL) suppository 325-650 mg, 325-650 mg, Rectal, Q4H PRN, Rhyne, Samantha J, PA-C .  alum & mag hydroxide-simeth (MAALOX/MYLANTA) 200-200-20 MG/5ML suspension 15-30 mL, 15-30 mL, Oral, Q2H PRN, Rhyne, Samantha J, PA-C .  aspirin EC tablet 81 mg, 81 mg, Oral, Daily, Rhyne, Samantha J, PA-C, 81 mg at 10/24/18 0943 .  bisacodyl (DULCOLAX) EC tablet 5-10 mg, 5-10 mg, Oral, Daily PRN, Rhyne, Samantha J, PA-C .  bisacodyl (DULCOLAX) suppository 10 mg, 10 mg, Rectal, Daily PRN, Rhyne, Samantha J, PA-C .  clonazePAM (KLONOPIN) tablet 0.5 mg, 0.5 mg, Oral, QHS, Rhyne, Samantha J, PA-C, 0.5 mg at 10/23/18 2015 .  docusate sodium (COLACE) capsule 100 mg, 100 mg, Oral, Daily, Rhyne, Samantha J, PA-C, 100 mg at 10/24/18 0943 .  DULoxetine (CYMBALTA) DR capsule 30 mg, 30 mg, Oral, QPM, Rhyne, Samantha J, PA-C, 30 mg at 10/23/18 1827 .  DULoxetine (CYMBALTA) DR capsule 60 mg, 60 mg, Oral, Daily, Rhyne, Samantha J, PA-C, 60 mg at 10/24/18 0943 .  enoxaparin (LOVENOX) injection 30 mg, 30 mg, Subcutaneous, Q24H, Rhyne, Samantha J, PA-C, 30 mg at 10/24/18 0944 .  guaiFENesin-dextromethorphan (ROBITUSSIN DM) 100-10 MG/5ML syrup 15 mL, 15 mL, Oral, Q4H PRN, Rhyne, Samantha J, PA-C .  hydrALAZINE (APRESOLINE) injection 5 mg, 5 mg, Intravenous, Q20 Min PRN, Rhyne, Samantha J, PA-C .  labetalol (NORMODYNE,TRANDATE) injection 10 mg, 10 mg, Intravenous, Q10 min PRN, Rhyne, Samantha J, PA-C .  lamoTRIgine (LAMICTAL) tablet 150 mg, 150 mg, Oral, Daily, Rhyne, Samantha J, PA-C, 150 mg at 10/24/18 0943 .  linaclotide (LINZESS) capsule 145 mcg, 145 mcg, Oral, QHS, Rhyne, Hulen Shouts, PA-C, 145 mcg at 10/23/18 2021 .  magnesium citrate solution 0.5 Bottle, 0.5 Bottle, Oral, Daily PRN, Rhyne, Samantha J, PA-C .  magnesium sulfate IVPB 2 g 50 mL, 2 g, Intravenous, Daily PRN, Rhyne, Samantha J, PA-C .  metoprolol tartrate (LOPRESSOR) injection 2-5 mg, 2-5 mg, Intravenous, Q2H PRN, Rhyne, Samantha J, PA-C .   morphine 2 MG/ML injection 2 mg, 2 mg, Intravenous, Q2H PRN, Rhyne, Samantha J, PA-C, 2 mg at 10/23/18 0827 .  mupirocin ointment (BACTROBAN) 2 % 1 application, 1 application, Topical, BID PRN, Rhyne, Samantha J, PA-C .  ondansetron (ZOFRAN) injection 4 mg, 4 mg, Intravenous, Q6H PRN, Rhyne, Samantha J, PA-C .  oxyCODONE-acetaminophen (PERCOCET/ROXICET) 5-325 MG per tablet 1-2 tablet, 1-2 tablet, Oral, Q4H PRN, Rhyne, Samantha J, PA-C, 1 tablet at 10/24/18 0636 .  pantoprazole (PROTONIX) EC tablet 40 mg, 40 mg, Oral, Daily, Rhyne, Samantha J, PA-C, 40 mg at 10/24/18 0943 .  phenol (CHLORASEPTIC) mouth spray 1 spray, 1 spray, Mouth/Throat, PRN, Rhyne, Samantha J, PA-C .  polyethylene glycol (MIRALAX / GLYCOLAX) packet 17 g, 17 g, Oral, QODAY, Rhyne, Samantha J, PA-C, 17 g at 10/23/18 0817 .  potassium chloride SA (K-DUR,KLOR-CON) CR tablet 20-40 mEq, 20-40 mEq, Oral, Daily PRN, Rhyne, Samantha J, PA-C .  rOPINIRole (REQUIP) tablet 2 mg, 2 mg, Oral, QHS, Rhyne, Samantha J, PA-C, 2 mg at 10/23/18 2014 .  senna (SENOKOT) tablet 17.2 mg, 17.2 mg, Oral, Q48H, Rhyne, Samantha J, PA-C, 17.2 mg at 10/23/18 2015  Patients Current Diet:  Diet Order            Diet regular Room service appropriate? Yes; Fluid consistency: Thin  Diet effective now              Precautions / Restrictions Precautions Precautions: Fall Restrictions Weight Bearing Restrictions: Yes LLE Weight Bearing: Weight bearing as tolerated LLE Partial Weight Bearing Percentage or Pounds: 75   Has the patient had 2 or more falls or a fall with injury in the past year?No  Prior Activity Level Limited Community (1-2x/wk): retired; drove PTA, mostly active around the Programmer, applications / [place] Devices/Equipment: Environmental consultant (specify type) Home Equipment: Shower seat - built in, Environmental consultant - 2 wheels, [place] - single point  Prior Device Use: Indicate devices/aids used by the patient prior to current illness,  exacerbation or injury? None of the above  Prior Functional Level Prior Function Level of Independence: Independent with assistive device(s) Comments: drove and lived alone, did her own housework  Self Care: Did the patient need help bathing, dressing, using the toilet or eating?  Independent  Indoor Mobility: Did the patient need assistance with walking from room to room (with or without device)? Independent  Stairs: Did the patient need assistance with internal or external stairs (with or without device)? Independent  Functional Cognition: Did the patient need help planning regular tasks such as shopping or remembering to take medications? Independent  Current Functional Level Cognition  Overall Cognitive Status: Within Functional Limits for tasks assessed Orientation Level: Oriented X4    Extremity Assessment (includes Sensation/Coordination)  Upper Extremity Assessment: Generalized weakness  Lower Extremity Assessment: Generalized weakness LLE Deficits / Details: s/p recent sx    ADLs  Overall ADL's : Needs assistance/impaired Eating/Feeding: Set up Grooming: Set up Upper Body Bathing: Set up Lower Body Bathing: Moderate assistance Upper Body Dressing : Set up Lower Body Dressing: Moderate assistance Toilet Transfer: Moderate assistance Toileting- Clothing Manipulation and Hygiene: Moderate assistance, Sitting/lateral lean, Sit to/from stand Functional mobility during ADLs: Minimal assistance, Moderate assistance, Rolling walker(ModA from bed to standing) General ADL Comments: Pt requires set-upA for UB ADL and ModA for LB ADL due to weakness in LLE. Pt performing sitting for grooming with set-upA.    Mobility  Overal bed mobility: Needs Assistance Bed Mobility: Sidelying to Sit, Supine to Sit Sidelying to sit: Min assist, HOB elevated Supine to sit: Min assist, HOB elevated General bed mobility comments: for LLE assisted    Transfers  Overall transfer level: Needs  assistance Equipment used: Rolling walker (2 wheeled) Transfers:  Sit to/from Stand Sit to Stand: Mod assist, From elevated surface General transfer comment: assist for power up    Ambulation / Gait / Stairs / Wheelchair Mobility  Ambulation/Gait Ambulation/Gait assistance: Herbalist (Feet): 35 Feet Assistive device: Rolling walker (2 wheeled), 1 person hand held assist Gait Pattern/deviations: Step-through pattern, Step-to pattern, Wide base of support, Decreased weight shift to left, Decreased stride length General Gait Details: pt is in some pain with effort and WB on LLE Gait velocity: reduced Gait velocity interpretation: <1.31 ft/sec, indicative of household ambulator    Posture / Balance Balance Overall balance assessment: Needs assistance Sitting-balance support: Feet supported, Bilateral upper extremity supported Sitting balance-Leahy Scale: Good Standing balance support: Bilateral upper extremity supported, During functional activity Standing balance-Leahy Scale: Poor Standing balance comment: needs extra time and assistance to gather her balance with walker    Special needs/care consideration BiPAP/CPAP: no  CPM: no Continuous Drip IV: 0.9% sodium chloride infusion  Dialysis: no        Days: no Life Vest: no Oxygen: no Special Bed: no Trach Size: no Wound Vac (area): no      Location: no Skin: surgical incision on Left leg Bowel mgmt: continent, 10/23/18 Bladder mgmt: continent Diabetic mgmt: no     Previous Home Environment Living Arrangements: Alone Available Help at Discharge: Other (Comment) Type of Home: House Home Layout: Two level, Bed/bath upstairs Alternate Level Stairs-Rails: Can reach both Alternate Level Stairs-Number of Steps: flight Home Access: Stairs to enter Technical brewer of Steps: 2 Bathroom Shower/Tub: Multimedia programmer: Handicapped height Bathroom Accessibility: Yes Home Care Services:  No  Discharge Living Setting Plans for Discharge Living Setting: Patient's home, Lives with (comment)(husband; will have nephew there for a month) Type of Home at Discharge: House Discharge Home Layout: Two level, Bed/bath upstairs Alternate Level Stairs-Rails: Can reach both Alternate Level Stairs-Number of Steps: full flight Discharge Home Access: Stairs to enter Entrance Stairs-Rails: None Entrance Stairs-Number of Steps: 2 Discharge Bathroom Shower/Tub: Walk-in shower Discharge Bathroom Toilet: Handicapped height Discharge Bathroom Accessibility: Yes How Accessible: Accessible via walker((may need walker downstairs and upstairs to accomodate)) Does the patient have any problems obtaining your medications?: No  Social/Family/Support Systems Patient Roles: Spouse(husband has Alzheimer's but self sufficient still) Contact Information: husband [name]): [phone]; [phone]; [phone]; son [name]): [phone] Anticipated Caregiver: husband and nephew (will be there for 1 month) Anticipated Caregiver's Contact Information: see above Ability/Limitations of Caregiver: supervision  Caregiver Availability: Other (Comment)(should be close to 24/7 as husband retired at home) Discharge Plan Discussed with Primary Caregiver: Yes(per pt, she will discuss with husband) Is Caregiver In Agreement with Plan?: Yes(per wife) Does Caregiver/Family have Issues with Lodging/Transportation while Pt is in Rehab?: No   Goals/Additional Needs Patient/Family Goal for Rehab: PT/OT: Mod I; SLP: NA Expected length of stay: 7-10 days Cultural Considerations: Catholic  Dietary Needs: regular diet, thin liquids Equipment Needs: TBD Pt/Family Agrees to Admission and willing to participate: Yes Program Orientation Provided & Reviewed with Pt/Caregiver Including Roles  & Responsibilities: Yes(with pt who plans to communicate with husband/family)  Barriers to Discharge: Home environment access/layout   Barriers to Discharge Comments: stairs to enter home; full flight of stairs to reach bedroom/bathroom   Decrease burden of Care through IP rehab admission: NA   Possible need for SNF placement upon discharge: Not anticipated; pt has good social support at home from husband who can provide supervision and from her nephew who will be at her home for approximately 1 month.  Pt has an excellent prognosis for further progress through CIR.    Patient Condition: This patient's condition remains as documented in the consult dated 10/24/18, in which the Rehabilitation Physician determined and documented that the patient's condition is appropriate for intensive rehabilitative care in an inpatient rehabilitation facility. Will admit to inpatient rehab today.  Preadmission Screen Completed By:  Jhonnie Garner, 10/24/2018 10:49 AM ______________________________________________________________________   Discussed status with Dr. Letta Pate on 10/24/18 at 10:50AM and received telephone approval for admission today.  Admission Coordinator:  Jhonnie Garner, time 10:50AM/Date 10/24/18

## 2018-10-24 NOTE — Progress Notes (Signed)
Pt arrived to 4M06. Pt is alert and oriented. Pt reports no pain. Pt has uncle and other visitors at bedside. Pt has call bell and phone within reach and bed alarm is in place.

## 2018-10-24 NOTE — H&P (Signed)
Physical Medicine and Rehabilitation Admission H&P     HPI: [name] is a 73 year old right-handed female history bipolar disorder, AAA, CVA, CAD, hypertension and tobacco abuse as well as peripheral vascular disease who lives with spouse independent prior to admission. Two-level home bath and bedroom downstairs and 2 steps to entry. Husband with history of dementia and limited assistance there is a son from [place] currently providing care. Presented  10/22/2018 followed by vascular surgery for long history of peripheral vascular disease noted claudication left lower extremity as well as ulcer of her pretibial region times several months. Due to progressive rest pain to the left foot patient underwent left superficial femoral artery to below-knee popliteal artery bypass using non-reverse left greater saphenous vein 10/22/2018 per Dr. Oneida Alar.hospital course pain management. Acute blood loss anemia 10.9. Postoperative ABIs with moderate arterial disease.Subcutaneous Lovenox for DVT prophylaxis. Therapy evaluations completed with recommendations of physical medicine rehabilitation consult. Patient was admitted for a conference rehabilitation program.  The patient was a smoker according to family. Review of Systems  Constitutional: Negative for chills and fever.  HENT: Negative for hearing loss.   Eyes: Negative for blurred vision and double vision.  Respiratory: Negative for cough and shortness of breath.   Cardiovascular: Positive for leg swelling. Negative for chest pain and palpitations.  Gastrointestinal: Positive for constipation. Negative for nausea and vomiting.       GERD  Genitourinary: Negative for dysuria, flank pain and hematuria.  Musculoskeletal: Positive for joint pain and myalgias.  Skin: Negative for rash.  Psychiatric/Behavioral: Positive for depression.       Anxiety,bipolar disorder  All other systems reviewed and are negative.  Past Medical History:  Diagnosis  Date  . AAA (abdominal aortic aneurysm) (HCC)    3.1 cm 07/08/18, 3 year follow-up recommended; possible 3 cm AAA by aortogram 09/13/18  . Anemia    PMH  . Appendicitis with abscess    07/08/18, s/p perc drain; resolved 07/30/18 by CT  . Arthritis   . Bipolar disorder ([place])   . Cerebrovascular disease    intra and extracranial vascular dx per MRI 4/11, neurology rec strict CVRF control  . Colonic inertia   . Constipation    chronic;severe  . Coronary artery disease   . Depression   . Duodenitis   . EKG abnormalities    changes, stress test neg (false EKG changes)  . Gastritis   . GERD (gastroesophageal reflux disease)   . Hypertension   . Hypothyroid 01/16/2014  . Meningioma ([place]) 10/21/2013  . PAD (peripheral artery disease) ([place])   . Psoriasis    sees derm  . Stroke ([place])   . Wears dentures   . Wears glasses    Past Surgical History:  Procedure Laterality Date  . ABDOMINAL AORTOGRAM W/LOWER EXTREMITY N/A 09/13/2018   Procedure: ABDOMINAL AORTOGRAM W/LOWER EXTREMITY;  Surgeon: Elam Dutch, MD;  Location: [facility];  Service: Cardiovascular;  Laterality: N/A;  . arthroscopy  04/2010   Right knee  . CATARACT EXTRACTION W/ INTRAOCULAR LENS  IMPLANT, BILATERAL    . COLONOSCOPY    . FEMORAL-POPLITEAL BYPASS GRAFT  10/22/2018  . FEMORAL-POPLITEAL BYPASS GRAFT Left 10/22/2018   Procedure: LEFT FEMORAL TO BELOW THE KNEE POPLITEAL ARTERY BYPASS GRAFT;  Surgeon: Elam Dutch, MD;  Location: [facility];  Service: Vascular;  Laterality: Left;  . IR RADIOLOGIST EVAL & MGMT  07/30/2018  . MULTIPLE TOOTH EXTRACTIONS    . TUBAL LIGATION  Family History  Problem Relation Age of Onset  . Breast cancer Mother        metastisis to bones  . Diabetes Son   . Heart disease Other        grandfather   . Alcohol abuse Brother   . Heart disease Brother   . Heart disease Maternal Aunt   . Lung cancer Brother        smoked  . Colon cancer Neg Hx    Social History:  reports  that she has been smoking cigarettes. She has a 60.00 pack-year smoking history. She has never used smokeless tobacco. She reports current alcohol use. She reports that she does not use drugs. Allergies: No Known Allergies Medications Prior to Admission  Medication Sig Dispense Refill  . aspirin EC 81 MG tablet Take 81 mg by mouth daily.    . bisacodyl (DULCOLAX) 5 MG EC tablet Take 5-10 mg by mouth daily as needed (constipation.).     Marland Kitchen clonazePAM (KLONOPIN) 0.5 MG tablet Take 0.5mg  tab every bedtime and 1 tab PRN for anxiety (Patient taking differently: Take 0.5 mg by mouth at bedtime. ) 40 tablet 0  . DULoxetine (CYMBALTA) 60 MG capsule Take 60 mg by mouth daily.     . famotidine (PEPCID) 20 MG tablet One at bedtime (Patient taking differently: Take 20 mg by mouth daily as needed (nausea). One at bedtime) 30 tablet 11  . lamoTRIgine (LAMICTAL) 150 MG tablet Take 1 tablet (150 mg total) by mouth 2 (two) times daily. (Patient taking differently: Take 150 mg by mouth daily. ) 180 tablet 0  . linaclotide (LINZESS) 145 MCG CAPS capsule Take 1 capsule (145 mcg total) by mouth daily before breakfast. (Patient taking differently: Take 145 mcg by mouth at bedtime. ) 30 capsule 3  . magnesium citrate SOLN Take 1 Bottle by mouth daily as needed for severe constipation (once a week as needed for severe constipation).    . polyethylene glycol powder (GLYCOLAX/MIRALAX) powder Take 17 g by mouth every other day.    Marland Kitchen rOPINIRole (REQUIP) 2 MG tablet TAKE 1 TABLET BY MOUTH AT BEDTIME (Patient taking differently: Take 2 mg by mouth at bedtime. ) 30 tablet 0  . Sennosides 25 MG TABS Take 25 mg by mouth every other day. At night.    Marland Kitchen amLODipine (NORVASC) 10 MG tablet Take 1 tablet (10 mg total) by mouth daily. (Patient not taking: Reported on 10/15/2018) 30 tablet 4  . atorvastatin (LIPITOR) 40 MG tablet Take 1 tablet (40 mg total) by mouth daily. (Patient not taking: Reported on 10/15/2018) 30 tablet 5  . DULoxetine  (CYMBALTA) 30 MG capsule Take 30 mg by mouth every evening.     Marland Kitchen losartan (COZAAR) 50 MG tablet Take 1 tablet (50 mg total) by mouth daily. (Patient not taking: Reported on 10/15/2018) 30 tablet 4  . meclizine (ANTIVERT) 12.5 MG tablet Take 1 tablet (12.5 mg total) by mouth 3 (three) times daily. (Patient not taking: Reported on 10/15/2018) 90 tablet 2  . mupirocin ointment (BACTROBAN) 2 % APPLY EXTERNALLY TO THE AFFECTED AREA TWICE DAILY (Patient taking differently: Apply 1 application topically 2 (two) times daily as needed (for wound/infection.). ) 22 g 0    Drug Regimen Review Drug regimen was reviewed and remains appropriate with no significant issues identified  Home: Home Living Family/patient expects to be discharged to:: Private residence Living Arrangements: Alone Available Help at Discharge: Other (Comment) Type of Home: House Home Access: Stairs to  enter Entrance Stairs-Number of Steps: 2 Home Layout: Two level, Bed/bath upstairs Alternate Level Stairs-Number of Steps: flight Alternate Level Stairs-Rails: Can reach both Bathroom Shower/Tub: Multimedia programmer: Handicapped height Bathroom Accessibility: Yes Home Equipment: [place] in, Environmental consultant - 2 wheels, [employer] - single point   Functional History: Prior Function Level of Independence: Independent with assistive device(s) Comments: drove and lived alone, did her own housework  Functional Status:  Mobility: Bed Mobility Overal bed mobility: Needs Assistance Bed Mobility: Sidelying to Sit, Supine to Sit Sidelying to sit: Min assist, HOB elevated Supine to sit: Min assist, HOB elevated General bed mobility comments: for LLE assisted Transfers Overall transfer level: Needs assistance Equipment used: Rolling walker (2 wheeled) Transfers: Sit to/from Stand Sit to Stand: Mod assist, From elevated surface General transfer comment: assist for power up Ambulation/Gait Ambulation/Gait assistance: Min  assist Gait Distance (Feet): 35 Feet Assistive device: Rolling walker (2 wheeled), 1 person hand held assist Gait Pattern/deviations: Step-through pattern, Step-to pattern, Wide base of support, Decreased weight shift to left, Decreased stride length General Gait Details: pt is in some pain with effort and WB on LLE Gait velocity: reduced Gait velocity interpretation: <1.31 ft/sec, indicative of household ambulator    ADL: ADL Overall ADL's : Needs assistance/impaired Eating/Feeding: Set up Grooming: Set up Upper Body Bathing: Set up Lower Body Bathing: Moderate assistance Upper Body Dressing : Set up Lower Body Dressing: Moderate assistance Toilet Transfer: Moderate assistance Toileting- Clothing Manipulation and Hygiene: Moderate assistance, Sitting/lateral lean, Sit to/from stand Functional mobility during ADLs: Minimal assistance, Moderate assistance, Rolling walker(ModA from bed to standing) General ADL Comments: Pt requires set-upA for UB ADL and ModA for LB ADL due to weakness in LLE. Pt performing sitting for grooming with set-upA.  Cognition: Cognition Overall Cognitive Status: Within Functional Limits for tasks assessed Orientation Level: Oriented X4 Cognition Arousal/Alertness: Awake/alert Behavior During Therapy: Anxious, WFL for tasks assessed/performed Overall Cognitive Status: Within Functional Limits for tasks assessed  Physical Exam: Blood pressure (!) 141/58, pulse 78, temperature (!) 97.5 F (36.4 C), temperature source Oral, resp. rate 18, height 5\' 8"  (1.727 m), weight 79.1 kg, SpO2 95 %. Physical Exam  Neurological:  Patient is alert. Sitting up in bed. Follows full commands. Provides her name and age. Fair awareness of deficits.  Skin:  Bypass graft site with dressing in place. Tip of left toe is slightly blue   General: No acute distress Mood and affect are appropriate Heart: Regular rate and rhythm no rubs murmurs or extra sounds Lungs: Clear to  auscultation, breathing unlabored, no rales or wheezes Abdomen: Positive bowel sounds, soft nontender to palpation, nondistended Extremities: No clubbing, cyanosis, or edema Skin left femoropopliteal surgical site clean dry and intact no evidence of erythema or drainage Neurologic:  motor strength is 5/5 in bilateral deltoid, bicep, tricep, grip,5/5 Right and 3/5 left hip flexor, knee extensors, ankle dorsiflexor and plantar flexor Sensory exam normal sensation to light touch  in bilateral upper and lower extremities Cerebellar exam normal finger to nose to finger Musculoskeletal: Full range of motion in upper ext, RLE,  LLE limited due to pain . No joint swelling  Results for orders placed or performed during the hospital encounter of 10/22/18 (from the past 48 hour(s))  CBC     Status: Abnormal   Collection Time: 10/23/18 12:10 AM  Result Value Ref Range   WBC 8.4 4.0 - 10.5 K/uL   RBC 3.71 (L) 3.87 - 5.11 MIL/uL   Hemoglobin 10.9 (  L) 12.0 - 15.0 g/dL   HCT 34.8 (L) 36.0 - 46.0 %   MCV 93.8 80.0 - 100.0 fL   MCH 29.4 26.0 - 34.0 pg   MCHC 31.3 30.0 - 36.0 g/dL   RDW 14.6 11.5 - 15.5 %   Platelets 211 150 - 400 K/uL   nRBC 0.0 0.0 - 0.2 %    Comment: Performed at [facility] [address], [place], [place] [address]  Basic metabolic panel     Status: Abnormal   Collection Time: 10/23/18 12:10 AM  Result Value Ref Range   Sodium 140 135 - 145 mmol/L   Potassium 4.4 3.5 - 5.1 mmol/L   Chloride 100 98 - 111 mmol/L   CO2 33 (H) 22 - 32 mmol/L   Glucose, Bld 96 70 - 99 mg/dL   BUN 12 8 - 23 mg/dL   Creatinine, Ser 0.96 0.44 - 1.00 mg/dL   Calcium 8.6 (L) 8.9 - 10.3 mg/dL   GFR calc non Af Amer 58 (L) >60 mL/min   GFR calc Af Amer >60 >60 mL/min   Anion gap 7 5 - 15    Comment: Performed at [facility] [address], [place], [place] [address]   Vas [place] Abi With/wo Tbi  Result Date: 10/23/2018 LOWER EXTREMITY DOPPLER STUDY Indications: Post peripheral bypass. High  Risk Factors: Hypertension, coronary artery disease.  Performing Technologist: Maudry Mayhew MHA, RVT, RDCS, RDMS  Examination Guidelines: A complete evaluation includes at minimum, Doppler waveform signals and systolic blood pressure reading at the level of bilateral brachial, anterior tibial, and posterior tibial arteries, when vessel segments are accessible. Bilateral testing is considered an integral part of a complete examination. Photoelectric Plethysmograph (PPG) waveforms and toe systolic pressure readings are included as required and additional duplex testing as needed. Limited examinations for reoccurring indications may be performed as noted.  ABI Findings: +--------+------------------+-----+----------+--------+ Right   Rt Pressure (mmHg)IndexWaveform  Comment  +--------+------------------+-----+----------+--------+ [id number]                    triphasic          +--------+------------------+-----+----------+--------+ PTA     142               0.82 triphasic          +--------+------------------+-----+----------+--------+ DP      161               0.93 monophasic         +--------+------------------+-----+----------+--------+ +--------+------------------+-----+----------+-------+ Left    Lt Pressure (mmHg)IndexWaveform  Comment +--------+------------------+-----+----------+-------+ [id number]                    monophasic        +--------+------------------+-----+----------+-------+ PTA     92                0.53 monophasic        +--------+------------------+-----+----------+-------+ DP      114               0.66 monophasic        +--------+------------------+-----+----------+-------+ +-------+-----------+-----------+------------+------------+ ABI/TBIToday's ABIToday's TBIPrevious ABIPrevious TBI +-------+-----------+-----------+------------+------------+ Right  0.93                                            +-------+-----------+-----------+------------+------------+ Left   0.66                                           +-------+-----------+-----------+------------+------------+  Summary: Right: Resting right ankle-brachial index indicates mild right lower extremity arterial disease. Left: Resting left ankle-brachial index indicates moderate left lower extremity arterial disease.  *See table(s) above for measurements and observations.  Electronically signed by Curt Jews MD on 10/23/2018 at 3:08:56 PM.   Final        Medical Problem List and Plan: 1.  Debility secondary to deficits from prior CVA as well as peripheral vascular disease status post left lower extremity BPG 10/22/2018 with ongoing sensory loss, pain and weakness left lower extremity Initial eval's for PT OT in a.m.  No speech will be needed. 2.  DVT Prophylaxis/Anticoagulation: Subcutaneous Lovenox 3. Pain Management:   Oxycodone as needed 4. Mood/bipolar disorder. Lamictal 150 mg daily,Klonopin 0.5 mg daily at bedtime, Cymbalta 60 mg daily and 30 mg every evening 5. Neuropsych: This patient is capable of making decisions on her own behalf. 6. Skin/Wound Care:  Routine skin checks 7. Fluids/Electrolytes/Nutrition:  Routine and an ounce with follow-up chemistries 8.History of CVA. Resume aspirin 9.  Hypertension. Monitor with increased mobility. Patient on Norvasc 10 mg daily prior to admission. Resume as needed 10. History of AAA. Follow-up outpatient 11. Tobacco abuse. Provide counseling 12. Hyperlipidemia. Lipitor 13. Constipation. Laxative assistance  Post Admission Physician Evaluation: 1. Functional deficits secondary  to peripheral vascular disease status post left lower extremity revascularization procedure 10/21/2018. 2. Patient admitted to receive collaborative, interdisciplinary care between the physiatrist, rehab nursing staff, and therapy team. 3. Patient's level of medical complexity and substantial therapy needs  in context of that medical necessity cannot be provided at a lesser intensity of care. 4. Patient has experienced substantial functional loss from his/her baseline. Judging by the patient's diagnosis, physical exam, and functional history, the patient has potential for functional progress which will result in measurable gains while on inpatient rehab.  These gains will be of substantial and practical use upon discharge in facilitating mobility and self-care at the household level. 5. Physiatrist will provide 24 hour management of medical needs as well as oversight of the therapy plan/treatment and provide guidance as appropriate regarding the interaction of the two. 6. 24 hour rehab nursing will assist in the management of  bladder management, bowel management, safety, skin/wound care, disease management, medication administration, pain management and patient education  and help integrate therapy concepts, techniques,education, etc. 7. PT will assess and treat for: Gait training, DME instruction, Stair training, Functional mobility training, Therapeutic activities, Therapeutic exercise, Balance training, Neuromuscular re-education, Patient/family education.  Goals are: supervision. 8. OT will assess and treat for Self-care/ADL training, Therapeutic exercise, Energy conservation, Therapeutic activities, Patient/family education, Balance training.  Goals are: supervision.  9. SLP will assess and treat for  .  Goals are: N/A. 10. Case Management and Social Worker will assess and treat for psychological issues and discharge planning. 11. Team conference will be held weekly to assess progress toward goals and to determine barriers to discharge. 12.  Patient will receive at least 3 hours of therapy per day at least 5 days per week. 13. ELOS and Prognosis: 7d excellent   "I have personally performed a face to face diagnostic evaluation of this patient.  Additionally, I have reviewed and concur with the  physician assistant's documentation above." Charlett Blake M.D. [facility] FAAPM&R (Sports Med, Neuromuscular Med) Diplomate Am Board of Electrodiagnostic Med  Elizabeth Sauer 10/24/2018

## 2018-10-24 NOTE — Interval H&P Note (Signed)
[name] was admitted today to Inpatient Rehabilitation with the diagnosis of vascular disease status post left femoropopliteal bypass.  The patient's history has been reviewed, patient examined, and there is no change in status.  Patient continues to be appropriate for intensive inpatient rehabilitation.  I have reviewed the patient's chart and labs.  Questions were answered to the patient's satisfaction. The PAPE has been reviewed and assessment remains appropriate.  Luanna Salk Kirsteins 10/24/2018, 3:57 PM

## 2018-10-25 ENCOUNTER — Inpatient Hospital Stay (HOSPITAL_COMMUNITY): Payer: Medicare Other | Admitting: Occupational Therapy

## 2018-10-25 ENCOUNTER — Inpatient Hospital Stay (HOSPITAL_COMMUNITY): Payer: Medicare Other

## 2018-10-25 DIAGNOSIS — D62 Acute posthemorrhagic anemia: Secondary | ICD-10-CM

## 2018-10-25 DIAGNOSIS — R5381 Other malaise: Principal | ICD-10-CM

## 2018-10-25 DIAGNOSIS — E46 Unspecified protein-calorie malnutrition: Secondary | ICD-10-CM

## 2018-10-25 DIAGNOSIS — I10 Essential (primary) hypertension: Secondary | ICD-10-CM

## 2018-10-25 DIAGNOSIS — E8809 Other disorders of plasma-protein metabolism, not elsewhere classified: Secondary | ICD-10-CM

## 2018-10-25 LAB — COMPREHENSIVE METABOLIC PANEL
ALT: 12 U/L (ref 0–44)
ANION GAP: 8 (ref 5–15)
AST: 17 U/L (ref 15–41)
Albumin: 2.7 g/dL — ABNORMAL LOW (ref 3.5–5.0)
Alkaline Phosphatase: 57 U/L (ref 38–126)
BILIRUBIN TOTAL: 0.6 mg/dL (ref 0.3–1.2)
BUN: 7 mg/dL — ABNORMAL LOW (ref 8–23)
CO2: 31 mmol/L (ref 22–32)
Calcium: 8.7 mg/dL — ABNORMAL LOW (ref 8.9–10.3)
Chloride: 96 mmol/L — ABNORMAL LOW (ref 98–111)
Creatinine, Ser: 0.83 mg/dL (ref 0.44–1.00)
GFR calc Af Amer: >60 mL/min (ref >60)
GFR calc non Af Amer: >60 mL/min (ref >60)
Glucose, Bld: 108 mg/dL — ABNORMAL HIGH (ref 70–99)
Potassium: 3.9 mmol/L (ref 3.5–5.1)
Sodium: 135 mmol/L (ref 135–145)
TOTAL PROTEIN: 5.9 g/dL — AB (ref 6.5–8.1)

## 2018-10-25 LAB — CBC WITH DIFFERENTIAL/PLATELET
Abs Immature Granulocytes: 0.04 10*3/uL (ref 0.00–0.07)
Basophils Absolute: 0 10*3/uL (ref 0.0–0.1)
Basophils Relative: 0 %
EOS PCT: 2 %
Eosinophils Absolute: 0.1 10*3/uL (ref 0.0–0.5)
HCT: 31.3 % — ABNORMAL LOW (ref 36.0–46.0)
Hemoglobin: 9.7 g/dL — ABNORMAL LOW (ref 12.0–15.0)
Immature Granulocytes: 1 %
Lymphocytes Relative: 25 %
Lymphs Abs: 2.1 10*3/uL (ref 0.7–4.0)
MCH: 28.6 pg (ref 26.0–34.0)
MCHC: 31 g/dL (ref 30.0–36.0)
MCV: 92.3 fL (ref 80.0–100.0)
Monocytes Absolute: 0.9 10*3/uL (ref 0.1–1.0)
Monocytes Relative: 10 %
Neutro Abs: 5.1 10*3/uL (ref 1.7–7.7)
Neutrophils Relative %: 62 %
Platelets: 161 10*3/uL (ref 150–400)
RBC: 3.39 MIL/uL — ABNORMAL LOW (ref 3.87–5.11)
RDW: 14 % (ref 11.5–15.5)
WBC: 8.2 10*3/uL (ref 4.0–10.5)
nRBC: 0 % (ref 0.0–0.2)

## 2018-10-25 MED ORDER — POLYETHYLENE GLYCOL 3350 17 G PO PACK
17.0000 g | PACK | Freq: Every day | ORAL | Status: DC
  Administered 2018-10-26 – 2018-10-29 (×4): 17 g via ORAL
  Filled 2018-10-25 (×4): qty 1

## 2018-10-25 MED ORDER — MAGNESIUM CITRATE PO SOLN: 1.0000 | Freq: Once | ORAL | Status: DC

## 2018-10-25 MED ORDER — PRO-STAT SUGAR FREE PO LIQD
30.0000 mL | Freq: Two times a day (BID) | ORAL | Status: DC
  Administered 2018-10-25 – 2018-10-29 (×8): 30 mL via ORAL
  Filled 2018-10-25 (×9): qty 30

## 2018-10-25 NOTE — Progress Notes (Signed)
[facility] PHYSICAL MEDICINE & REHABILITATION PROGRESS NOTE  Subjective/Complaints: Patient seen sitting up in bed this morning.  She states she did not sleep well overnight due to trouble getting comfortable.  ROS: Denies CP, shortness of breath, nausea, vomiting, diarrhea.  Objective: Vital Signs: Blood pressure (!) 154/77, pulse 66, temperature 98.7 F (37.1 C), resp. rate 16, height 5\' 8"  (1.727 m), weight 81.8 kg, SpO2 98 %. Vas [place] Burnard Bunting With/wo Tbi  Result Date: 10/23/2018 LOWER EXTREMITY DOPPLER STUDY Indications: Post peripheral bypass. High Risk Factors: Hypertension, coronary artery disease.  Performing Technologist: Maudry Mayhew MHA, RVT, RDCS, RDMS  Examination Guidelines: A complete evaluation includes at minimum, Doppler waveform signals and systolic blood pressure reading at the level of bilateral brachial, anterior tibial, and posterior tibial arteries, when vessel segments are accessible. Bilateral testing is considered an integral part of a complete examination. Photoelectric Plethysmograph (PPG) waveforms and toe systolic pressure readings are included as required and additional duplex testing as needed. Limited examinations for reoccurring indications may be performed as noted.  ABI Findings: +--------+------------------+-----+----------+--------+ Right   Rt Pressure (mmHg)IndexWaveform  Comment  +--------+------------------+-----+----------+--------+ [id number]                    triphasic          +--------+------------------+-----+----------+--------+ PTA     142               0.82 triphasic          +--------+------------------+-----+----------+--------+ DP      161               0.93 monophasic         +--------+------------------+-----+----------+--------+ +--------+------------------+-----+----------+-------+ Left    Lt Pressure (mmHg)IndexWaveform  Comment +--------+------------------+-----+----------+-------+ [id number]                     monophasic        +--------+------------------+-----+----------+-------+ PTA     92                0.53 monophasic        +--------+------------------+-----+----------+-------+ DP      114               0.66 monophasic        +--------+------------------+-----+----------+-------+ +-------+-----------+-----------+------------+------------+ ABI/TBIToday's ABIToday's TBIPrevious ABIPrevious TBI +-------+-----------+-----------+------------+------------+ Right  0.93                                           +-------+-----------+-----------+------------+------------+ Left   0.66                                           +-------+-----------+-----------+------------+------------+  Summary: Right: Resting right ankle-brachial index indicates mild right lower extremity arterial disease. Left: Resting left ankle-brachial index indicates moderate left lower extremity arterial disease.  *See table(s) above for measurements and observations.  Electronically signed by Curt Jews MD on 10/23/2018 at 3:08:56 PM.   Final    Recent Labs    10/23/18 0010 10/25/18 0508  WBC 8.4 8.2  HGB 10.9* 9.7*  HCT 34.8* 31.3*  PLT 211 161   Recent Labs    10/23/18 0010 10/25/18 0508  NA 140 135  K 4.4 3.9  CL 100 96*  CO2 33* 31  GLUCOSE 96 108*  BUN  12 7*  CREATININE 0.96 0.83  CALCIUM 8.6* 8.7*    Physical Exam: BP (!) 154/77 (BP Location: Left Arm) Comment: rechecked  Pulse 66   Temp 98.7 F (37.1 C)   Resp 16   Ht 5\' 8"  (1.727 m)   Wt 81.8 kg   SpO2 98%   BMI 27.42 kg/m  Constitutional: No distress . Vital signs reviewed. HENT: Normocephalic.  Atraumatic. Eyes: EOMI. No discharge. Cardiovascular: RRR. No JVD. Respiratory: CTA Bilaterally. Normal effort. GI: BS +. Non-distended. Musc: Tenderness proximal left lower extremity Neuro: Alert. Motor: 5/5 in bilateral deltoid, bicep, tricep, grip 5/5  RLE: 5/5 proximal distal LLE: Hip flexion 2/5, knee extension 3/5  clinical dorsiflexion 5/5 (pain inhibition) Skinleft femoropopliteal surgical site with dressing C/D/I  Assessment/Plan: 1. Functional deficits secondary to debility which require 3+ hours per day of interdisciplinary therapy in a comprehensive inpatient rehab setting.  Physiatrist is providing close team supervision and 24 hour management of active medical problems listed below.  Physiatrist and rehab team continue to assess barriers to discharge/monitor patient progress toward functional and medical goals  Care Tool:  Bathing              Bathing assist       Upper Body Dressing/Undressing Upper body dressing        Upper body assist      Lower Body Dressing/Undressing Lower body dressing            Lower body assist       Toileting Toileting    Toileting assist Assist for toileting: Contact Guard/Touching assist     Transfers Chair/bed transfer  Transfers assist     Chair/bed transfer assist level: Minimal Assistance - Patient > 75%     Locomotion Ambulation   Ambulation assist              Walk 10 feet activity   Assist           Walk 50 feet activity   Assist           Walk 150 feet activity   Assist           Walk 10 feet on uneven surface  activity   Assist           Wheelchair     Assist               Wheelchair 50 feet with 2 turns activity    Assist            Wheelchair 150 feet activity     Assist            Medical Problem List and Plan: 1.Debilitysecondary to deficits from prior CVA as well as peripheral vascular disease status post left lower extremity BPG 10/22/2018 with ongoing sensory loss, pain and weakness left lower extremity  Begin CIR  Notes reviewed, labs reviewed 2. DVT Prophylaxis/Anticoagulation: Subcutaneous Lovenox 3. Pain Management:Oxycodone as needed 4. Mood/bipolar disorder. Lamictal 150 mg daily,Klonopin 0.5 mg daily at bedtime,  Cymbalta 60 mg daily and 30 mg every evening 5. Neuropsych: This patientiscapable of making decisions on herown behalf. 6. Skin/Wound Care:Routine skin checks 7. Fluids/Electrolytes/Nutrition:Routine and ins and outs  BMP within acceptable range on 2/14 8.History of CVA. Resume aspirin 9. Hypertension. Patient on Norvasc 10 mg daily prior to admission. Resume as needed  Monitor with increased mobility 10. History of AAA. Follow-up outpatient 11. Tobacco abuse. Provide counseling 12. Hyperlipidemia. Lipitor 13. Constipation. Laxative assistance 14.  Hypoalbuminemia  Supplement initiated on 2/14 15.  Acute blood loss anemia  Hemoglobin 9.7 on 2/14  Continue to monitor  LOS: 1 days A FACE TO FACE EVALUATION WAS PERFORMED   Lorie Phenix 10/25/2018, 8:34 AM

## 2018-10-25 NOTE — Plan of Care (Signed)
  Problem: Consults Goal: RH STROKE PATIENT EDUCATION Description See Patient Education module for education specifics  Outcome: Progressing   Problem: RH SKIN INTEGRITY Goal: RH STG SKIN FREE OF INFECTION/BREAKDOWN Description Pt will be able to prevent breakdown by moving in bed with min assist while in rehab.   Outcome: Progressing Goal: RH STG ABLE TO PERFORM INCISION/WOUND CARE W/ASSISTANCE Description STG Able To Perform Incision/Wound Care With [employer].  Outcome: Progressing   Problem: RH PAIN MANAGEMENT Goal: RH STG PAIN MANAGED AT OR BELOW PT'S PAIN GOAL Description < 3 out of 10.   Outcome: Progressing

## 2018-10-25 NOTE — Progress Notes (Signed)
Social Work  Social Work Assessment and Plan  Patient Details  Name: [name] MRN: [id number] Date of Birth: November 29, 1945  Today's Date: 10/25/2018  Problem List:  Patient Active Problem List   Diagnosis Date Noted  . Acute blood loss anemia   . Hypoalbuminemia due to protein-calorie malnutrition ([place])   . Debility 10/24/2018  . Pre-operative cardiovascular examination 09/26/2018  . Dyslipidemia, goal LDL below 70 09/26/2018  . GAD (generalized anxiety disorder) 08/07/2018  . Major depressive disorder, single episode 08/07/2018  . Appendiceal abscess 07/08/2018  . PAD (peripheral artery disease) ([place]) 03/15/2018  . Atherosclerosis of native artery of left lower extremity with intermittent claudication ([place]) 03/15/2018  . Chronic left-sided low back pain with left-sided sciatica 12/25/2017  . Facial droop   . History of CVA (cerebrovascular accident) 10/23/2017  . Critical lower limb ischemia 11/08/2016  . Smoker 11/08/2016  . 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 Cigarette smoker 12/24/2015  . Hypothyroid ? 01/16/2014  . Mild cognitive impairment 01/16/2014  . Meningioma ([place]) 10/21/2013  . Chest pain 10/20/2013  . Medicare annual wellness visit, subsequent 06/16/2013  . Dizziness and giddiness 06/16/2013  . RLS (restless legs syndrome) 04/29/2012  . Abdominal pain 12/27/2010  . DEGENERATIVE JOINT DISEASE 09/23/2010  . CAROTID ARTERY DISEASE 08/15/2010  . CHEST PAIN 08/15/2010  . HEADACHE 12/02/2009  . BACK PAIN 11/22/2009  . CONSTIPATION, CHRONIC 01/29/2008  . NAUSEA 01/28/2008  . DEPRESSION 03/08/2007  . Essential hypertension 03/08/2007   Past Medical History:  Past Medical History:  Diagnosis Date  . AAA (abdominal aortic aneurysm) ([facility])    3.1 cm 07/08/18, 3 year follow-up recommended; possible 3 cm AAA by aortogram 09/13/18  . Anemia    PMH  . Appendicitis with abscess    07/08/18, s/p perc drain; resolved 07/30/18 by CT  . Arthritis   .  Bipolar disorder ([place])   . Cerebrovascular disease    intra and extracranial vascular dx per MRI 4/11, neurology rec strict CVRF control  . Colonic inertia   . Constipation    chronic;severe  . Coronary artery disease   . Depression   . Duodenitis   . EKG abnormalities    changes, stress test neg (false EKG changes)  . Gastritis   . GERD (gastroesophageal reflux disease)   . Hypertension   . Hypothyroid 01/16/2014  . Meningioma ([place]) 10/21/2013  . PAD (peripheral artery disease) ([place])   . Psoriasis    sees derm  . Stroke ([place])   . Wears dentures   . Wears glasses    Past Surgical History:  Past Surgical History:  Procedure Laterality Date  . ABDOMINAL AORTOGRAM W/LOWER EXTREMITY N/A 09/13/2018   Procedure: ABDOMINAL AORTOGRAM W/LOWER EXTREMITY;  Surgeon: Elam Dutch, MD;  Location: [facility];  Service: Cardiovascular;  Laterality: N/A;  . arthroscopy  04/2010   Right knee  . CATARACT EXTRACTION W/ INTRAOCULAR LENS  IMPLANT, BILATERAL    . COLONOSCOPY    . FEMORAL-POPLITEAL BYPASS GRAFT  10/22/2018  . FEMORAL-POPLITEAL BYPASS GRAFT Left 10/22/2018   Procedure: LEFT FEMORAL TO BELOW THE KNEE POPLITEAL ARTERY BYPASS GRAFT;  Surgeon: Elam Dutch, MD;  Location: [facility];  Service: Vascular;  Laterality: Left;  . IR RADIOLOGIST EVAL & MGMT  07/30/2018  . MULTIPLE TOOTH EXTRACTIONS    . TUBAL LIGATION     Social History:  reports that she has been smoking cigarettes. She has a 60.00 pack-year smoking history. She has never used smokeless  tobacco. She reports current alcohol use. She reports that she does not use drugs.  Family / Support Systems Marital Status: Married How Long?: 1992 Patient Roles: Spouse, Parent, Caregiver, Other (Comment)(Aunt) Spouse/Significant Other: [name] [phone] Children: Jimmye Norman in [place] Other Supports: Nephew who is here now for one month Anticipated Caregiver: nephew who is here for one  month for transition home. Husband requires supervision reason nephew is here Ability/Limitations of Caregiver: Needs to be mod/i level for when nephew leaves Caregiver Availability: 24/7(nephew will be here for a short time then will go back home) Family Dynamics: Close knit with her two children and husband has two children form his first marriage. The have been married 28 years. They have friends and church members who are supportive also  Social History Preferred language: English Religion: Catholic Cultural Background: No issues Education: Secretary/administrator educated Read: Yes Write: Yes Employment Status: Retired Public relations account executive Issues: No issues Guardian/Conservator: None-according to MD pt is capable of making her own decisions while here   Abuse/Neglect Abuse/Neglect Assessment Can Be Completed: Yes Physical Abuse: Denies Verbal Abuse: Denies Sexual Abuse: Denies Exploitation of patient/patient's resources: Denies Self-Neglect: Denies  Emotional Status Pt's affect, behavior and adjustment status: Pt is motivated to do well and recover from this surgery. She feels she is doing well and better than MD expected. She has always been independent and taken care of herself and now her husband. Thsi at times is stressful for her. Recent Psychosocial Issues: other health issues and husband's alzhiemers Psychiatric History: History of bipolar and depression takes medications for this and feels they are helpful. She would benefit from seeing neuro-psych while here and will make referral for this Substance Abuse History: Tobacco knows needs to quit but helps her stress but will try to quit. Aware of the resources available to her  Patient / Family Perceptions, Expectations & Goals Pt/Family understanding of illness & functional limitations: Pt can explain her surgery and limitations due to this. She feels she has a good understanding of her treatment plan going forward and is ready to  get up and move, she is tired of being in a bed. Premorbid pt/family roles/activities: Wife, mother, grandmother, retiree, church member, etc Anticipated changes in roles/activities/participation: resume Pt/family expectations/goals: Pt states: " I need to be able to get up and take care of myself, my nephew will be here for a short time."   [employer]: Other (Comment)(had in the past) Premorbid Home Care/DME Agencies: Other (Comment)([facility] prior referral pre-op will get involved. has rw, cane and tub seat) Transportation available at discharge: Nephew and pt Resource referrals recommended: Neuropsychology, Support group (specify)  Discharge Planning Living Arrangements: Spouse/significant other Support Systems: Spouse/significant other, Children, Other relatives, Friends/neighbors, Church/faith community Type of Residence: Private residence Insurance Resources: [employer], Multimedia programmer (specify)(BCBS) Financial Resources: Radio broadcast assistant Screen Referred: No Living Expenses: Own Money Management: Patient Does the patient have any problems obtaining your medications?: No Home Management: Patient does the home management now nephew will until she can again Patient/Family Preliminary Plans: Return home with husband and nephew who will be here for one month he may be able to stay longer if pt needs this. Pt did not sleep well last night so falls asleep while talking. Will await team's evaluatiosn and work on safe discharge plan for her. Sw Barriers to Discharge: Decreased caregiver support Sw Barriers to Discharge Comments: Pt was the caregiver for her husband prior to admission Social Work Anticipated Follow Up Needs:  HH/OP, Support Group  Clinical Impression Pleasant female who is quite sleepy from not sleeping last night. She is motivated to do well and regain her independence. Her son was here but has gone back to [place]. Her nephew is here  for one month and could possibly stay longer if needed. Pt's husband needs supervision level at home due to his alzhiemers. Will await therapy evaluations and work on the best plan for them. Pt would benefit from seeing neuro-psych while here. Will make referral.  Elease Hashimoto 10/25/2018, 10:36 AM

## 2018-10-25 NOTE — Progress Notes (Signed)
Occupational Therapy Session Note  Patient Details  Name: [name] MRN: [id number] Date of Birth: 1946/04/04  Today's Date: 10/25/2018 OT Individual Time: 1120-1200 OT Individual Time Calculation (min): 40 min    Short Term Goals: Week 1:     Skilled Therapeutic Interventions/Progress Updates:    Pt seen this session to work on functional mobility skills to tolerate movement of her L leg.  Pt was able to sit to stand to RW with S, stand pivot with S, sat on low bed with S and scooted up several times to [facility] with S, and then lifted L leg into bed with S.  Prior to getting back into bed pt sat at EOB and talked for quite awhile about her recent confusion.  She is aware she has been confused.  She asked me if her husband was just here. He was not but pt had just talked to him on the phone. She was able to discuss her past jobs, her neighborhood and home clearly.   Once pt moved back into bed, pt given lunch tray. She said eating from the w.c is difficult as it is too low for the bedside table.  Pt in room with all needs met and bed alarm set.   Therapy Documentation Precautions:  Restrictions Weight Bearing Restrictions: Yes LLE Weight Bearing: Weight bearing as tolerated  Pain: Pain Assessment Pain Scale: Faces Faces Pain Scale: Hurts little more Pain Type: Acute pain;Surgical pain Pain Location: Leg Pain Orientation: Left Pain Descriptors / Indicators: Aching;Discomfort Pain Onset: On-going Pain Intervention(s): Emotional support;Repositioned Multiple Pain Sites: No    Therapy/Group: Individual Therapy  [place] 10/25/2018, 12:20 PM

## 2018-10-25 NOTE — Progress Notes (Signed)
Pt reports feeling confused and "seeing things". Pt reports that she keeps "talking to the television". Pt alert and oriented to place and situation. Dr Posey Pronto made aware. No new orders at this time. Will continue to monitor.   Azha Constantin W Ofelia Podolski

## 2018-10-25 NOTE — IPOC Note (Signed)
Overall Plan of Care [facility]) Patient Details Name: [name] MRN: [id number] DOB: 24-Feb-1946  Admitting Diagnosis: [facility] Problems: Active Problems:   Debility   Acute blood loss anemia   Hypoalbuminemia due to protein-calorie malnutrition [facility])     Functional Problem List: Nursing Pain, Medication Management, Endurance, Motor, Skin Integrity  PT Balance, Safety, Endurance, Behavior, Motor, Pain  OT Balance, Cognition, Pain, Safety, Endurance, Motor  SLP    TR         Basic ADL's: OT Grooming, Bathing, Dressing, Toileting     Advanced  ADL's: OT       Transfers: PT Bed Mobility, Bed to Chair, Car, Manufacturing systems engineer, Metallurgist: PT Ambulation, Emergency planning/management officer, Stairs     Additional Impairments: OT None  SLP        TR      Anticipated Outcomes Item Anticipated Outcome  Self Feeding Indep  Swallowing      Basic self-care  Supervision  Toileting  Mod I   Bathroom Transfers Supervision-mod I  Bowel/Bladder  Pt will manage bowel and bladder with min assist   Transfers  Supervision  Locomotion  Supervision for gait, min A for stairs  Communication     Cognition     Pain  Pt will manage pain at 3 or less on a scale of 0-10.   Safety/Judgment  Pt will follow safety plan with min cues while in rehab.    Therapy Plan: PT Intensity: Minimum of 1-2 x/day ,45 to 90 minutes PT Frequency: 5 out of 7 days PT Duration Estimated Length of Stay: 7-10 days OT Intensity: Minimum of 1-2 x/day, 45 to 90 minutes OT Frequency: 5 out of 7 days OT Duration/Estimated Length of Stay: 7-10 days      Team Interventions: Nursing Interventions Patient/Family Education, Medication Management, Pain Management, Discharge Planning, Disease Management/Prevention  PT interventions Ambulation/gait training, Stair training, UE/LE Strength taining/ROM, Wheelchair propulsion/positioning, DME/adaptive equipment instruction, Human resources officer, Therapeutic Activities, Therapeutic Exercise, Patient/family education, Functional mobility training  OT Interventions Balance/vestibular training, Cognitive remediation/compensation, Discharge planning, DME/adaptive equipment instruction, Functional mobility training, Pain management, Psychosocial support, Therapeutic Activities, UE/LE Strength taining/ROM, UE/LE Coordination activities, Therapeutic Exercise, Self Care/advanced ADL retraining, Patient/family education  SLP Interventions    TR Interventions    SW/CM Interventions Discharge Planning, Psychosocial Support, Patient/Family Education   Barriers to Discharge MD  Medical stability  Nursing      PT Home environment access/layout two level home with upstairs bedroom  OT Decreased caregiver support, Home environment access/layout Lives with husband with dementia; master bedroom on 2nd floor  SLP      SW Decreased caregiver support Pt was the caregiver for her husband prior to admission   Team Discharge Planning: Destination: PT-Home ,OT- Home , SLP-  Projected Follow-up: PT-Home health PT, 24 hour supervision/assistance, OT-  Home health OT, SLP-  Projected Equipment Needs: PT-To be determined, OT- To be determined, SLP-  Equipment Details: PT- , OT-  Patient/family involved in discharge planning: PT- Patient, Family member/caregiver,  OT-Patient, SLP-   MD ELOS: 7-10 days. Medical Rehab Prognosis:  Good Assessment: 73 year old right-handed female history bipolar disorder, AAA, CVA, CAD, hypertension and tobacco abuse as well as peripheral vascular disease who lives with spouse independent prior to admission. Presented  10/22/2018 followed by vascular surgery for long history of peripheral vascular disease noted claudication left lower extremity as well as ulcer of her pretibial region times several months. Due to progressive  rest pain to the left foot patient underwent left superficial femoral artery to below-knee  popliteal artery bypass using non-reverse left greater saphenous vein 10/22/2018 per Dr. Oneida Alar.hospital course pain management. Acute blood loss anemia 10.9. Postoperative ABIs with moderate arterial disease.  Patient with resulting functional deficits with mobility, transfers, self-care, endurance.  We will set goals for Mod I/Supervision with PT/OT.   See Team Conference Notes for weekly updates to the plan of care

## 2018-10-25 NOTE — Progress Notes (Signed)
Pt's family brought magnesium citrate from home for pt to take for purpose of BM. Pt educated on the risks of taking magnesium and possible complications. Charge nurse called in to further explain the risks. Pt states that she does not care about the policy or what the doctors has to say. Pt stated that she will take it despite being advised not to take it by Pam, PA. Pt is on Linsess, miralax, and senna to regulate bowel movement. Pt's last BM on 10/24/2018. Continue plan of care.   [name]

## 2018-10-25 NOTE — Evaluation (Signed)
Occupational Therapy Assessment and Plan  Patient Details  Name: [name] MRN: [id number] Date of Birth: 05-03-1946  OT Diagnosis: acute pain, cognitive deficits, muscle weakness (generalized) and pain in joint Rehab Potential: Rehab Potential (ACUTE ONLY): Good ELOS: 7-10 days   Today's Date: 10/25/2018 OT Individual Time: 0945-1100 OT Individual Time Calculation (min): 75 min     Problem List:  Patient Active Problem List   Diagnosis Date Noted  . Acute blood loss anemia   . Hypoalbuminemia due to protein-calorie malnutrition ([place])   . Debility 10/24/2018  . Pre-operative cardiovascular examination 09/26/2018  . Dyslipidemia, goal LDL below 70 09/26/2018  . GAD (generalized anxiety disorder) 08/07/2018  . Major depressive disorder, single episode 08/07/2018  . Appendiceal abscess 07/08/2018  . PAD (peripheral artery disease) ([place]) 03/15/2018  . Atherosclerosis of native artery of left lower extremity with intermittent claudication ([place]) 03/15/2018  . Chronic left-sided low back pain with left-sided sciatica 12/25/2017  . Facial droop   . History of CVA (cerebrovascular accident) 10/23/2017  . Critical lower limb ischemia 11/08/2016  . Smoker 11/08/2016  . Postinflammatory pulmonary fibrosis ([place]) 02/14/2016  . Cigarette smoker 12/24/2015  . Hypothyroid ? 01/16/2014  . Mild cognitive impairment 01/16/2014  . Meningioma ([place]) 10/21/2013  . Chest pain 10/20/2013  . Medicare annual wellness visit, subsequent 06/16/2013  . Dizziness and giddiness 06/16/2013  . RLS (restless legs syndrome) 04/29/2012  . Abdominal pain 12/27/2010  . DEGENERATIVE JOINT DISEASE 09/23/2010  . CAROTID ARTERY DISEASE 08/15/2010  . CHEST PAIN 08/15/2010  . HEADACHE 12/02/2009  . BACK PAIN 11/22/2009  . CONSTIPATION, CHRONIC 01/29/2008  . NAUSEA 01/28/2008  . DEPRESSION 03/08/2007  . Essential hypertension 03/08/2007    Past Medical History:  Past Medical History:  Diagnosis Date  . AAA  (abdominal aortic aneurysm) ([facility])    3.1 cm 07/08/18, 3 year follow-up recommended; possible 3 cm AAA by aortogram 09/13/18  . Anemia    PMH  . Appendicitis with abscess    07/08/18, s/p perc drain; resolved 07/30/18 by CT  . Arthritis   . Bipolar disorder ([place])   . Cerebrovascular disease    intra and extracranial vascular dx per MRI 4/11, neurology rec strict CVRF control  . Colonic inertia   . Constipation    chronic;severe  . Coronary artery disease   . Depression   . Duodenitis   . EKG abnormalities    changes, stress test neg (false EKG changes)  . Gastritis   . GERD (gastroesophageal reflux disease)   . Hypertension   . Hypothyroid 01/16/2014  . Meningioma ([place]) 10/21/2013  . PAD (peripheral artery disease) ([place])   . Psoriasis    sees derm  . Stroke ([place])   . Wears dentures   . Wears glasses    Past Surgical History:  Past Surgical History:  Procedure Laterality Date  . ABDOMINAL AORTOGRAM W/LOWER EXTREMITY N/A 09/13/2018   Procedure: ABDOMINAL AORTOGRAM W/LOWER EXTREMITY;  Surgeon: Elam Dutch, MD;  [facility];  Service: Cardiovascular;  Laterality: N/A;  . arthroscopy  04/2010   Right knee  . CATARACT EXTRACTION W/ INTRAOCULAR LENS  IMPLANT, BILATERAL    . COLONOSCOPY    . FEMORAL-POPLITEAL BYPASS GRAFT  10/22/2018  . FEMORAL-POPLITEAL BYPASS GRAFT Left 10/22/2018   Procedure: LEFT FEMORAL TO BELOW THE KNEE POPLITEAL ARTERY BYPASS GRAFT;  Surgeon: Elam Dutch, MD;  Location: [facility];  Service: Vascular;  Laterality: Left;  . IR RADIOLOGIST EVAL & MGMT  07/30/2018  .  MULTIPLE TOOTH EXTRACTIONS    . TUBAL LIGATION      Assessment & Plan Clinical Impression: [name] is a 73 year old right-handed female history bipolar disorder, AAA, CVA, CAD, hypertension and tobacco abuse as well as peripheral vascular disease who lives with spouse independent prior to admission. Two-level home bath and bedroom downstairs and 2 steps to entry. Husband  with history of dementia and limited assistance there is a son from [place] currently providing care. Presented 10/22/2018 followed by vascular surgery for long history of peripheral vascular disease noted claudication left lower extremity as well as ulcer of her pretibial region times several months. Due to progressive rest pain to the left foot patient underwent left superficial femoral artery to below-knee popliteal artery bypass using non-reverse left greater saphenous vein 10/22/2018 per Dr. Oneida Alar.hospital course pain management. Acute blood loss anemia 10.9. Postoperative ABIs with moderate arterial disease.Subcutaneous Lovenox for DVT prophylaxis. Therapy evaluations completed with recommendations of physical medicine rehabilitation consult. Patient was admitted for a conference rehabilitation program. Patient transferred to CIR on 10/24/2018 .    Patient currently requires min- mod A with basic self-care skills secondary to muscle weakness, decreased cardiorespiratoy endurance, decreased awareness, decreased problem solving and decreased safety awareness and decreased sitting balance, decreased standing balance, decreased postural control, decreased balance strategies and difficulty maintaining precautions.  Prior to hospitalization, patient could complete ADLs/IADLs with independent .  Patient will benefit from skilled intervention to decrease level of assist with basic self-care skills, increase independence with basic self-care skills and increase level of independence with iADL prior to discharge home with care partner.  Anticipate patient will require 24 hour supervision and follow up home health.  OT - End of Session Activity Tolerance: Tolerates 10 - 20 min activity with multiple rests Endurance Deficit: Yes Endurance Deficit Description: Rest breaks required throughout bathing/dressing tasks OT Assessment Rehab Potential (ACUTE ONLY): Good OT Barriers to Discharge: Decreased caregiver  support;Home environment access/layout OT Barriers to Discharge Comments: Lives with husband with dementia; master bedroom on 2nd floor OT Patient demonstrates impairments in the following area(s): Balance;Cognition;Pain;Safety;Endurance;Motor OT Basic ADL's Functional Problem(s): Grooming;Bathing;Dressing;Toileting OT Transfers Functional Problem(s): Toilet;Tub/Shower OT Additional Impairment(s): None OT Plan OT Intensity: Minimum of 1-2 x/day, 45 to 90 minutes OT Frequency: 5 out of 7 days OT Duration/Estimated Length of Stay: 7-10 days OT Treatment/Interventions: Balance/vestibular training;Cognitive remediation/compensation;Discharge planning;DME/adaptive equipment instruction;Functional mobility training;Pain management;Psychosocial support;Therapeutic Activities;UE/LE Strength taining/ROM;UE/LE Coordination activities;Therapeutic Exercise;Self Care/advanced ADL retraining;Patient/family education OT Self Feeding Anticipated Outcome(s): Indep OT Basic Self-Care Anticipated Outcome(s): Supervision OT Toileting Anticipated Outcome(s): Mod I OT Bathroom Transfers Anticipated Outcome(s): Supervision-mod I OT Recommendation Recommendations for Other Services: Neuropsych consult Patient destination: Home Follow Up Recommendations: Home health OT Equipment Recommended: To be determined   Skilled Therapeutic Intervention Pt seen for OT eval and ADL bathing/dressing session. Pt in supine upon arrival, denying pain and agreeable to tx session. She transferred to EOB with min A using hospital bed functions. She initially required mod A to stand from EOB, however, sit>stands from w/c completed throughout session with min A.  Completed bathing/dressing from w/c level at sink, not cleared to shower at this time. Required mod A overall, assist to wash L LE as pt unable to reach 2/2 ROM deficits. Pt donned hospital gown with supervision/set-up, total A to don B socks. She completed stand pivot  transfer to toilet with use of grab bars, mod A for controlled descent and steadying assist while standing to complete pericare and clothing management. Pt left seated  in w/c at end of session, chair belt alarm on and all needs in reach.  Education provided throughout session regarding role of OT, POC, OT goals, and d/c planning.  Pt impulsive throughout session requiring VCs for safety awareness, pt with decreased insight into deficits and high fall risk.   OT Evaluation Precautions/Restrictions  Precautions Precautions: Fall Restrictions Weight Bearing Restrictions: Yes LLE Weight Bearing: Weight bearing as tolerated General Chart Reviewed: Yes Pain   No/denies pain Home Living/Prior Functioning Home Living Family/patient expects to be discharged to:: Private residence Living Arrangements: Spouse/significant other Available Help at Discharge: (Husband has high functional dementia,, Nephew plans to stay for 3 weeks to assist at d/c) Type of Home: House Home Access: Stairs to enter [employer] of Steps: 2 Home Layout: Two level, Bed/bath upstairs Alternate Level Stairs-Number of Steps: flight Alternate Level Stairs-Rails: Can reach both Bathroom Shower/Tub: Walk-in shower, Tub/shower unit(Walk in shower on 2nd level; tub shower on main level) Bathroom Toilet: Handicapped height Bathroom Accessibility: Yes  Lives With: Spouse, Other (Comment)(Nephew) IADL History Homemaking Responsibilities: Yes Current License: Yes Mode of Transportation: Musician Occupation: Retired Prior Function Level of Independence: Independent with basic ADLs, Independent with transfers, Independent with homemaking with ambulation, Independent with gait  Able to Take Stairs?: Yes Driving: Yes Vocation: Retired Surveyor, mining Baseline Vision/History: Wears glasses Wears Glasses: Reading only Patient Visual Report: No change from baseline Vision Assessment?: No apparent visual deficits Perception   Perception: Within Functional Limits Praxis Praxis: Intact Cognition Overall Cognitive Status: Impaired/Different from baseline Arousal/Alertness: Awake/alert Orientation Level: Person;Place;Situation Person: Oriented Place: Oriented Situation: Oriented Year: 2020 Month: February Day of Week: Correct Memory: Impaired Memory Impairment: Decreased short term memory Decreased Short Term Memory: Verbal complex;Functional complex Immediate Memory Recall: Sock;Blue;Bed Memory Recall: Blue;Bed Memory Recall Blue: Without Cue Memory Recall Bed: With Cue Awareness: Impaired Awareness Impairment: Anticipatory impairment Problem Solving: Impaired Problem Solving Impairment: Verbal complex;Functional complex Behaviors: Impulsive Safety/Judgment: Impaired Comments: Decreased safety awareness and awareness of deficits Sensation Sensation Light Touch: Appears Intact Coordination Gross Motor Movements are Fluid and Coordinated: No Fine Motor Movements are Fluid and Coordinated: Yes Coordination and Movement Description: Imapired due to L LE ROM and WBing limitations Motor  Motor Motor: Within Functional Limits Motor - Skilled Clinical Observations: Generalized weakness, hesitency with movement 2/2 expecting pain Trunk/Postural Assessment  Cervical Assessment Cervical Assessment: Within Functional Limits Thoracic Assessment Thoracic Assessment: Exceptions to WFL(Kyphotic; Rounded shoulders) Lumbar Assessment Lumbar Assessment: Exceptions to WFL(Posterior pelvic tilt) Postural Control Postural Control: Deficits on evaluation  Balance Balance Balance Assessed: Yes Static Sitting Balance Static Sitting - Balance Support: Feet supported;No upper extremity supported Static Sitting - Level of Assistance: 5: Stand by assistance Static Sitting - Comment/# of Minutes: Sitting EOB Dynamic Sitting Balance Dynamic Sitting - Balance Support: During functional activity;Feet supported;No  upper extremity supported Dynamic Sitting - Level of Assistance: 5: Stand by assistance;4: Min assist Sitting balance - Comments: sitting in w/c to complete bathing/dressing task Static Standing Balance Static Standing - Balance Support: During functional activity;Right upper extremity supported;Left upper extremity supported Static Standing - Level of Assistance: 4: Min assist;3: Mod assist Dynamic Standing Balance Dynamic Standing - Balance Support: During functional activity;No upper extremity supported Dynamic Standing - Level of Assistance: 3: Mod assist Dynamic Standing - Comments: Standing to complete LB dressing and toileting tasks Extremity/Trunk Assessment RUE Assessment RUE Assessment: Within Functional Limits LUE Assessment LUE Assessment: Within Functional Limits     Refer to Care Plan for Long Term Goals  Recommendations for  other services: Neuropsych and Therapeutic Recreation  Stress management and Outing/community reintegration   Discharge Criteria: Patient will be discharged from OT if patient refuses treatment 3 consecutive times without medical reason, if treatment goals not met, if there is a change in medical status, if patient makes no progress towards goals or if patient is discharged from hospital.  The above assessment, treatment plan, treatment alternatives and goals were discussed and mutually agreed upon: by patient  Bethanee Redondo L 10/25/2018, 1:00 PM

## 2018-10-25 NOTE — H&P (Signed)
Physical Medicine and Rehabilitation Admission H&P       HPI: [name] is a 73 year old right-handed female history bipolar disorder, AAA, CVA, CAD, hypertension and tobacco abuse as well as peripheral vascular disease who lives with spouse independent prior to admission. Two-level home bath and bedroom downstairs and 2 steps to entry. Husband with history of dementia and limited assistance there is a son from [place] currently providing care. Presented  10/22/2018 followed by vascular surgery for long history of peripheral vascular disease noted claudication left lower extremity as well as ulcer of her pretibial region times several months. Due to progressive rest pain to the left foot patient underwent left superficial femoral artery to below-knee popliteal artery bypass using non-reverse left greater saphenous vein 10/22/2018 per Dr. Oneida Alar.hospital course pain management. Acute blood loss anemia 10.9. Postoperative ABIs with moderate arterial disease.Subcutaneous Lovenox for DVT prophylaxis. Therapy evaluations completed with recommendations of physical medicine rehabilitation consult. Patient was admitted for a conference rehabilitation program.   The patient was a smoker according to family. Review of Systems  Constitutional: Negative for chills and fever.  HENT: Negative for hearing loss.   Eyes: Negative for blurred vision and double vision.  Respiratory: Negative for cough and shortness of breath.   Cardiovascular: Positive for leg swelling. Negative for chest pain and palpitations.  Gastrointestinal: Positive for constipation. Negative for nausea and vomiting.       GERD  Genitourinary: Negative for dysuria, flank pain and hematuria.  Musculoskeletal: Positive for joint pain and myalgias.  Skin: Negative for rash.  Psychiatric/Behavioral: Positive for depression.       Anxiety,bipolar disorder  All other systems reviewed and are negative.       Past Medical History:  Diagnosis  Date  . AAA (abdominal aortic aneurysm) (HCC)      3.1 cm 07/08/18, 3 year follow-up recommended; possible 3 cm AAA by aortogram 09/13/18  . Anemia      PMH  . Appendicitis with abscess      07/08/18, s/p perc drain; resolved 07/30/18 by CT  . Arthritis    . Bipolar disorder ([place])    . Cerebrovascular disease      intra and extracranial vascular dx per MRI 4/11, neurology rec strict CVRF control  . Colonic inertia    . Constipation      chronic;severe  . Coronary artery disease    . Depression    . Duodenitis    . EKG abnormalities      changes, stress test neg (false EKG changes)  . Gastritis    . GERD (gastroesophageal reflux disease)    . Hypertension    . Hypothyroid 01/16/2014  . Meningioma ([place]) 10/21/2013  . PAD (peripheral artery disease) ([place])    . Psoriasis      sees derm  . Stroke ([place])    . Wears dentures    . Wears glasses           Past Surgical History:  Procedure Laterality Date  . ABDOMINAL AORTOGRAM W/LOWER EXTREMITY N/A 09/13/2018    Procedure: ABDOMINAL AORTOGRAM W/LOWER EXTREMITY;  Surgeon: Elam Dutch, MD;  Location: [facility];  Service: Cardiovascular;  Laterality: N/A;  . arthroscopy   04/2010    Right knee  . CATARACT EXTRACTION W/ INTRAOCULAR LENS  IMPLANT, BILATERAL      . COLONOSCOPY      . FEMORAL-POPLITEAL BYPASS GRAFT   10/22/2018  . FEMORAL-POPLITEAL BYPASS GRAFT Left 10/22/2018    Procedure: LEFT  FEMORAL TO BELOW THE KNEE POPLITEAL ARTERY BYPASS GRAFT;  Surgeon: Elam Dutch, MD;  Location: [facility];  Service: Vascular;  Laterality: Left;  . IR RADIOLOGIST EVAL & MGMT   07/30/2018  . MULTIPLE TOOTH EXTRACTIONS      . TUBAL LIGATION             Family History  Problem Relation Age of Onset  . Breast cancer Mother          metastisis to bones  . Diabetes Son    . Heart disease Other          grandfather   . Alcohol abuse Brother    . Heart disease Brother    . Heart disease Maternal Aunt    . Lung cancer Brother           smoked  . Colon cancer Neg Hx      Social History:  reports that she has been smoking cigarettes. She has a 60.00 pack-year smoking history. She has never used smokeless tobacco. She reports current alcohol use. She reports that she does not use drugs. Allergies: No Known Allergies       Medications Prior to Admission  Medication Sig Dispense Refill  . aspirin EC 81 MG tablet Take 81 mg by mouth daily.      . bisacodyl (DULCOLAX) 5 MG EC tablet Take 5-10 mg by mouth daily as needed (constipation.).       Marland Kitchen clonazePAM (KLONOPIN) 0.5 MG tablet Take 0.5mg  tab every bedtime and 1 tab PRN for anxiety (Patient taking differently: Take 0.5 mg by mouth at bedtime. ) 40 tablet 0  . DULoxetine (CYMBALTA) 60 MG capsule Take 60 mg by mouth daily.       . famotidine (PEPCID) 20 MG tablet One at bedtime (Patient taking differently: Take 20 mg by mouth daily as needed (nausea). One at bedtime) 30 tablet 11  . lamoTRIgine (LAMICTAL) 150 MG tablet Take 1 tablet (150 mg total) by mouth 2 (two) times daily. (Patient taking differently: Take 150 mg by mouth daily. ) 180 tablet 0  . linaclotide (LINZESS) 145 MCG CAPS capsule Take 1 capsule (145 mcg total) by mouth daily before breakfast. (Patient taking differently: Take 145 mcg by mouth at bedtime. ) 30 capsule 3  . magnesium citrate SOLN Take 1 Bottle by mouth daily as needed for severe constipation (once a week as needed for severe constipation).      . polyethylene glycol powder (GLYCOLAX/MIRALAX) powder Take 17 g by mouth every other day.      Marland Kitchen rOPINIRole (REQUIP) 2 MG tablet TAKE 1 TABLET BY MOUTH AT BEDTIME (Patient taking differently: Take 2 mg by mouth at bedtime. ) 30 tablet 0  . Sennosides 25 MG TABS Take 25 mg by mouth every other day. At night.      Marland Kitchen amLODipine (NORVASC) 10 MG tablet Take 1 tablet (10 mg total) by mouth daily. (Patient not taking: Reported on 10/15/2018) 30 tablet 4  . atorvastatin (LIPITOR) 40 MG tablet Take 1 tablet (40 mg total) by  mouth daily. (Patient not taking: Reported on 10/15/2018) 30 tablet 5  . DULoxetine (CYMBALTA) 30 MG capsule Take 30 mg by mouth every evening.       Marland Kitchen losartan (COZAAR) 50 MG tablet Take 1 tablet (50 mg total) by mouth daily. (Patient not taking: Reported on 10/15/2018) 30 tablet 4  . meclizine (ANTIVERT) 12.5 MG tablet Take 1 tablet (12.5 mg total) by mouth 3 (three)  times daily. (Patient not taking: Reported on 10/15/2018) 90 tablet 2  . mupirocin ointment (BACTROBAN) 2 % APPLY EXTERNALLY TO THE AFFECTED AREA TWICE DAILY (Patient taking differently: Apply 1 application topically 2 (two) times daily as needed (for wound/infection.). ) 22 g 0      Drug Regimen Review Drug regimen was reviewed and remains appropriate with no significant issues identified   Home: Home Living Family/patient expects to be discharged to:: Private residence Living Arrangements: Alone Available Help at Discharge: Other (Comment) Type of Home: House Home Access: Stairs to enter Entrance Stairs-Number of Steps: 2 Home Layout: Two level, Bed/bath upstairs Alternate Level Stairs-Number of Steps: flight Alternate Level Stairs-Rails: Can reach both Bathroom Shower/Tub: Multimedia programmer: Handicapped height Bathroom Accessibility: Yes Home Equipment: Civil engineer, contracting - built in, Environmental consultant - 2 wheels, [place] - single point   Functional History: Prior Function Level of Independence: Independent with assistive device(s) Comments: drove and lived alone, did her own housework   Functional Status:  Mobility: Bed Mobility Overal bed mobility: Needs Assistance Bed Mobility: Sidelying to Sit, Supine to Sit Sidelying to sit: Min assist, HOB elevated Supine to sit: Min assist, HOB elevated General bed mobility comments: for LLE assisted Transfers Overall transfer level: Needs assistance Equipment used: Rolling walker (2 wheeled) Transfers: Sit to/from Stand Sit to Stand: Mod assist, From elevated surface General  transfer comment: assist for power up Ambulation/Gait Ambulation/Gait assistance: Min assist Gait Distance (Feet): 35 Feet Assistive device: Rolling walker (2 wheeled), 1 person hand held assist Gait Pattern/deviations: Step-through pattern, Step-to pattern, Wide base of support, Decreased weight shift to left, Decreased stride length General Gait Details: pt is in some pain with effort and WB on LLE Gait velocity: reduced Gait velocity interpretation: <1.31 ft/sec, indicative of household ambulator   ADL: ADL Overall ADL's : Needs assistance/impaired Eating/Feeding: Set up Grooming: Set up Upper Body Bathing: Set up Lower Body Bathing: Moderate assistance Upper Body Dressing : Set up Lower Body Dressing: Moderate assistance Toilet Transfer: Moderate assistance Toileting- Clothing Manipulation and Hygiene: Moderate assistance, Sitting/lateral lean, Sit to/from stand Functional mobility during ADLs: Minimal assistance, Moderate assistance, Rolling walker(ModA from bed to standing) General ADL Comments: Pt requires set-upA for UB ADL and ModA for LB ADL due to weakness in LLE. Pt performing sitting for grooming with set-upA.   Cognition: Cognition Overall Cognitive Status: Within Functional Limits for tasks assessed Orientation Level: Oriented X4 Cognition Arousal/Alertness: Awake/alert Behavior During Therapy: Anxious, WFL for tasks assessed/performed Overall Cognitive Status: Within Functional Limits for tasks assessed   Physical Exam: Blood pressure (!) 141/58, pulse 78, temperature (!) 97.5 F (36.4 C), temperature source Oral, resp. rate 18, height 5\' 8"  (1.727 m), weight 79.1 kg, SpO2 95 %. Physical Exam  Neurological:  Patient is alert. Sitting up in bed. Follows full commands. Provides her name and age. Fair awareness of deficits.  Skin:  Bypass graft site with dressing in place. Tip of left toe is slightly blue    General: No acute distress Mood and affect are  appropriate Heart: Regular rate and rhythm no rubs murmurs or extra sounds Lungs: Clear to auscultation, breathing unlabored, no rales or wheezes Abdomen: Positive bowel sounds, soft nontender to palpation, nondistended Extremities: No clubbing, cyanosis, or edema Skin left femoropopliteal surgical site clean dry and intact no evidence of erythema or drainage Neurologic:  motor strength is 5/5 in bilateral deltoid, bicep, tricep, grip,5/5 Right and 3/5 left hip flexor, knee extensors, ankle dorsiflexor and plantar flexor Sensory exam  normal sensation to light touch  in bilateral upper and lower extremities Cerebellar exam normal finger to nose to finger Musculoskeletal: Full range of motion in upper ext, RLE,  LLE limited due to pain . No joint swelling   Lab Results Last 48 Hours  Results for orders placed or performed during the hospital encounter of 10/22/18 (from the past 48 hour(s))  CBC     Status: Abnormal    Collection Time: 10/23/18 12:10 AM  Result Value Ref Range    WBC 8.4 4.0 - 10.5 K/uL    RBC 3.71 (L) 3.87 - 5.11 MIL/uL    Hemoglobin 10.9 (L) 12.0 - 15.0 g/dL    HCT 34.8 (L) 36.0 - 46.0 %    MCV 93.8 80.0 - 100.0 fL    MCH 29.4 26.0 - 34.0 pg    MCHC 31.3 30.0 - 36.0 g/dL    RDW 14.6 11.5 - 15.5 %    Platelets 211 150 - 400 K/uL    nRBC 0.0 0.0 - 0.2 %      Comment: Performed at [facility], [place] [address] [place], [place] [address]  Basic metabolic panel     Status: Abnormal    Collection Time: 10/23/18 12:10 AM  Result Value Ref Range    Sodium 140 135 - 145 mmol/L    Potassium 4.4 3.5 - 5.1 mmol/L    Chloride 100 98 - 111 mmol/L    CO2 33 (H) 22 - 32 mmol/L    Glucose, Bld 96 70 - 99 mg/dL    BUN 12 8 - 23 mg/dL    Creatinine, Ser 0.96 0.44 - 1.00 mg/dL    Calcium 8.6 (L) 8.9 - 10.3 mg/dL    GFR calc non Af Amer 58 (L) >60 mL/min    GFR calc Af Amer >60 >60 mL/min    Anion gap 7 5 - 15      Comment: Performed at [facility] [address], [place], [place] [address]       Imaging Results (Last 48 hours)  Vas [place] Abi With/wo Tbi   Result Date: 10/23/2018 LOWER EXTREMITY DOPPLER STUDY Indications: Post peripheral bypass. High Risk Factors: Hypertension, coronary artery disease.  Performing Technologist: Maudry Mayhew MHA, RVT, RDCS, RDMS  Examination Guidelines: A complete evaluation includes at minimum, Doppler waveform signals and systolic blood pressure reading at the level of bilateral brachial, anterior tibial, and posterior tibial arteries, when vessel segments are accessible. Bilateral testing is considered an integral part of a complete examination. Photoelectric Plethysmograph (PPG) waveforms and toe systolic pressure readings are included as required and additional duplex testing as needed. Limited examinations for reoccurring indications may be performed as noted.  ABI Findings: +--------+------------------+-----+----------+--------+ Right   Rt Pressure (mmHg)IndexWaveform  Comment  +--------+------------------+-----+----------+--------+ [id number]                    triphasic          +--------+------------------+-----+----------+--------+ PTA     142               0.82 triphasic          +--------+------------------+-----+----------+--------+ DP      161               0.93 monophasic         +--------+------------------+-----+----------+--------+ +--------+------------------+-----+----------+-------+ Left    Lt Pressure (mmHg)IndexWaveform  Comment +--------+------------------+-----+----------+-------+ [id number]  monophasic        +--------+------------------+-----+----------+-------+ PTA     92                0.53 monophasic        +--------+------------------+-----+----------+-------+ DP      114               0.66 monophasic        +--------+------------------+-----+----------+-------+ +-------+-----------+-----------+------------+------------+  ABI/TBIToday's ABIToday's TBIPrevious ABIPrevious TBI +-------+-----------+-----------+------------+------------+ Right  0.93                                           +-------+-----------+-----------+------------+------------+ Left   0.66                                           +-------+-----------+-----------+------------+------------+  Summary: Right: Resting right ankle-brachial index indicates mild right lower extremity arterial disease. Left: Resting left ankle-brachial index indicates moderate left lower extremity arterial disease.  *See table(s) above for measurements and observations.  Electronically signed by Curt Jews MD on 10/23/2018 at 3:08:56 PM.   Final              Medical Problem List and Plan: 1.  Debility secondary to deficits from prior CVA as well as peripheral vascular disease status post left lower extremity BPG 10/22/2018 with ongoing sensory loss, pain and weakness left lower extremity Initial eval's for PT OT in a.m.  No speech will be needed. 2.  DVT Prophylaxis/Anticoagulation: Subcutaneous Lovenox 3. Pain Management:   Oxycodone as needed 4. Mood/bipolar disorder. Lamictal 150 mg daily,Klonopin 0.5 mg daily at bedtime, Cymbalta 60 mg daily and 30 mg every evening 5. Neuropsych: This patient is capable of making decisions on her own behalf. 6. Skin/Wound Care:  Routine skin checks 7. Fluids/Electrolytes/Nutrition:  Routine and an ounce with follow-up chemistries 8.History of CVA. Resume aspirin 9.  Hypertension. Monitor with increased mobility. Patient on Norvasc 10 mg daily prior to admission. Resume as needed 10. History of AAA. Follow-up outpatient 11. Tobacco abuse. Provide counseling 12. Hyperlipidemia. Lipitor 13. Constipation. Laxative assistance   Post Admission Physician Evaluation: 1. Functional deficits secondary  to peripheral vascular disease status post left lower extremity revascularization procedure 10/21/2018. 2. Patient admitted  to receive collaborative, interdisciplinary care between the physiatrist, rehab nursing staff, and therapy team. 3. Patient's level of medical complexity and substantial therapy needs in context of that medical necessity cannot be provided at a lesser intensity of care. 4. Patient has experienced substantial functional loss from his/her baseline. Judging by the patient's diagnosis, physical exam, and functional history, the patient has potential for functional progress which will result in measurable gains while on inpatient rehab.  These gains will be of substantial and practical use upon discharge in facilitating mobility and self-care at the household level. 5. Physiatrist will provide 24 hour management of medical needs as well as oversight of the therapy plan/treatment and provide guidance as appropriate regarding the interaction of the two. 6. 24 hour rehab nursing will assist in the management of  bladder management, bowel management, safety, skin/wound care, disease management, medication administration, pain management and patient education  and help integrate therapy concepts, techniques,education, etc. 7. PT will assess and treat for: Gait training, DME instruction, Stair training, Functional mobility training, Therapeutic activities, Therapeutic exercise,  Balance training, Neuromuscular re-education, Patient/family education.  Goals are: supervision. 8. OT will assess and treat for Self-care/ADL training, Therapeutic exercise, Energy conservation, Therapeutic activities, Patient/family education, Balance training.  Goals are: supervision.  9. SLP will assess and treat for  .  Goals are: N/A. 10. Case Management and Social Worker will assess and treat for psychological issues and discharge planning. 11. Team conference will be held weekly to assess progress toward goals and to determine barriers to discharge. 12.  Patient will receive at least 3 hours of therapy per day at least 5 days per  week. 13. ELOS and Prognosis: 7d excellent     "I have personally performed a face to face diagnostic evaluation of this patient.  Additionally, I have reviewed and concur with the physician assistant's documentation above." Charlett Blake M.D. [facility] FAAPM&R (Sports Med, Neuromuscular Med) Diplomate Am Board of Electrodiagnostic Med   Elizabeth Sauer 10/24/2018

## 2018-10-25 NOTE — Evaluation (Signed)
Physical Therapy Assessment and Plan  Patient Details  Name: [name] MRN: [id number] Date of Birth: 10/08/1945  PT Diagnosis: Abnormality of gait, Difficulty walking and Pain in left leg Rehab Potential: Good ELOS: 7-10 days   Today's Date: 10/25/2018 PT Individual Time: 1415-1510 PT Individual Time Calculation (min): 55 min    Problem List:  Patient Active Problem List   Diagnosis Date Noted  . Acute blood loss anemia   . Hypoalbuminemia due to protein-calorie malnutrition ([place])   . Debility 10/24/2018  . Pre-operative cardiovascular examination 09/26/2018  . Dyslipidemia, goal LDL below 70 09/26/2018  . GAD (generalized anxiety disorder) 08/07/2018  . Major depressive disorder, single episode 08/07/2018  . Appendiceal abscess 07/08/2018  . PAD (peripheral artery disease) ([place]) 03/15/2018  . Atherosclerosis of native artery of left lower extremity with intermittent claudication ([place]) 03/15/2018  . Chronic left-sided low back pain with left-sided sciatica 12/25/2017  . Facial droop   . History of CVA (cerebrovascular accident) 10/23/2017  . Critical lower limb ischemia 11/08/2016  . Smoker 11/08/2016  . Postinflammatory pulmonary fibrosis ([place]) 02/14/2016  . Cigarette smoker 12/24/2015  . Hypothyroid ? 01/16/2014  . Mild cognitive impairment 01/16/2014  . Meningioma ([place]) 10/21/2013  . Chest pain 10/20/2013  . Medicare annual wellness visit, subsequent 06/16/2013  . Dizziness and giddiness 06/16/2013  . RLS (restless legs syndrome) 04/29/2012  . Abdominal pain 12/27/2010  . DEGENERATIVE JOINT DISEASE 09/23/2010  . CAROTID ARTERY DISEASE 08/15/2010  . CHEST PAIN 08/15/2010  . HEADACHE 12/02/2009  . BACK PAIN 11/22/2009  . CONSTIPATION, CHRONIC 01/29/2008  . NAUSEA 01/28/2008  . DEPRESSION 03/08/2007  . Essential hypertension 03/08/2007    Past Medical History:  Past Medical History:  Diagnosis Date  . AAA (abdominal aortic aneurysm) (HCC)    3.1 cm  07/08/18, 3 year follow-up recommended; possible 3 cm AAA by aortogram 09/13/18  . Anemia    PMH  . Appendicitis with abscess    07/08/18, s/p perc drain; resolved 07/30/18 by CT  . Arthritis   . Bipolar disorder ([place])   . Cerebrovascular disease    intra and extracranial vascular dx per MRI 4/11, neurology rec strict CVRF control  . Colonic inertia   . Constipation    chronic;severe  . Coronary artery disease   . Depression   . Duodenitis   . EKG abnormalities    changes, stress test neg (false EKG changes)  . Gastritis   . GERD (gastroesophageal reflux disease)   . Hypertension   . Hypothyroid 01/16/2014  . Meningioma ([place]) 10/21/2013  . PAD (peripheral artery disease) ([place])   . Psoriasis    sees derm  . Stroke ([place])   . Wears dentures   . Wears glasses    Past Surgical History:  Past Surgical History:  Procedure Laterality Date  . ABDOMINAL AORTOGRAM W/LOWER EXTREMITY N/A 09/13/2018   Procedure: ABDOMINAL AORTOGRAM W/LOWER EXTREMITY;  Surgeon: Elam Dutch, MD;  Location: [facility];  Service: Cardiovascular;  Laterality: N/A;  . arthroscopy  04/2010   Right knee  . CATARACT EXTRACTION W/ INTRAOCULAR LENS  IMPLANT, BILATERAL    . COLONOSCOPY    . FEMORAL-POPLITEAL BYPASS GRAFT  10/22/2018  . FEMORAL-POPLITEAL BYPASS GRAFT Left 10/22/2018   Procedure: LEFT FEMORAL TO BELOW THE KNEE POPLITEAL ARTERY BYPASS GRAFT;  Surgeon: Elam Dutch, MD;  Location: [facility];  Service: Vascular;  Laterality: Left;  . IR RADIOLOGIST EVAL & MGMT  07/30/2018  . MULTIPLE TOOTH EXTRACTIONS    .  TUBAL LIGATION      Assessment & Plan Clinical Impression: [name] is a 73 year old right-handed female history bipolar disorder, AAA, CVA, CAD, hypertension and tobacco abuse as well as peripheral vascular disease who lives with spouse independent prior to admission. Two-level home bath and bedroom downstairs and 2 steps to entry. Husband with history of dementia and limited  assistance there is a son from [place] currently providing care. Presented 10/22/2018 followed by vascular surgery for long history of peripheral vascular disease noted claudication left lower extremity as well as ulcer of her pretibial region times several months. Due to progressive rest pain to the left foot patient underwent left superficial femoral artery to below-knee popliteal artery bypass using non-reverse left greater saphenous vein 10/22/2018 per Dr. Oneida Alar.hospital course pain management. Acute blood loss anemia 10.9. Postoperative ABIs with moderate arterial disease.Subcutaneous Lovenox for DVT prophylaxis. Therapy evaluations completed with recommendations of physical medicine rehabilitation consult. Patient was admitted for a comprehensive rehabilitation program. Patient transferred to CIR on 10/24/2018 .   Patient currently requires mod with mobility secondary to muscle weakness and pain.  Prior to hospitalization, patient was independent  with mobility and lived with Spouse in a House home with second floor bedroom with flight and two rails to access.  Home entry access is 2Stairs to enter.  Patient will benefit from skilled PT intervention to maximize safe functional mobility and minimize fall risk for planned discharge home with 24 hour assist.  Anticipate patient will benefit from follow up [facility] at discharge.  PT - End of Session Activity Tolerance: Decreased this session;Tolerates 30+ min activity with multiple rests Endurance Deficit: Yes Endurance Deficit Description: Rest breaks required during mobility tasks PT Assessment Rehab Potential (ACUTE/IP ONLY): Good PT Barriers to Discharge: Home environment access/layout PT Barriers to Discharge Comments: two level home with upstairs bedroom PT Patient demonstrates impairments in the following area(s): Balance;Safety;Endurance;Behavior;Motor;Pain PT Transfers Functional Problem(s): Bed Mobility;Bed to Chair;Car;Furniture PT Locomotion  Functional Problem(s): Ambulation;Wheelchair Mobility;Stairs PT Plan PT Intensity: Minimum of 1-2 x/day ,45 to 90 minutes PT Frequency: 5 out of 7 days PT Duration Estimated Length of Stay: 7-10 days PT Treatment/Interventions: Ambulation/gait training;Stair training;UE/LE Strength taining/ROM;Wheelchair propulsion/positioning;DME/adaptive Chief Technology Officer;Therapeutic Activities;Therapeutic Exercise;Patient/family education;Functional mobility training PT Transfers Anticipated Outcome(s): Supervision PT Locomotion Anticipated Outcome(s): Supervision for gait, min A for stairs PT Recommendation Follow Up Recommendations: Home health PT;24 hour supervision/assistance Patient destination: Home Equipment Recommended: To be determined  Skilled Therapeutic Intervention Patient in supine and educated on evaluation procedures and to rehab schedule as she had requested to forego therapy due to leg edema and oozing from incisions as well as to visit with visitors in the room.  Obtained dressing for L LE where she reported oozing with nursing assistance and placed tegaderm along two of the medial L thigh incisions.  Patient needing to toilet so assisted to sit min A from bed with elevated HOB with assist for L LE.  Sit to stand from elevated bed mod A and gait to bathroom with RW and min A increased time and with L LE shuffling the floor.  Patient performed hygiene and min to mod A to stand with cues for hand placement.  Patient ambulated 20' to w/c and assisted to set up w/c to go to closet to exchange for larger chair.  Patient in hallway attempting to propel chair pulling on wall railings while PT obtaining new w/c.  Educated to wait for PT to obtain new legrest after mod A for stand pivot  to new w/c.  Patient continued to propel in hallway pulling on rail with other staff assisting her and she reported again aggravating pain in posterior L thigh when trying to move herself.   Assisted to adjust legrest and pt propelled in w/c with min A to S and cues for technique. Eager to return to room for guests, but insisted to show her the gym.  Discussed stairs, but pt not ready to attempt.  Assisted to her room to stand and ambulated 15' to bed with RW min A.  Sit to supine min A for L LE and cues for technique.  To flat bed, pt c/o aggravating pain in back of L thigh with spasm.  Cues to lean back on elbows and HOB elevated for comfort.  Placed ice under thigh and left with bed alarm on and needs in reach.  PT Evaluation Precautions/Restrictions Precautions Precautions: Fall Restrictions LLE Weight Bearing: Weight bearing as tolerated General PT Amount of Missed Time (min): 20 Minutes PT Missed Treatment Reason: Patient unwilling to participate;Pain(due to company in the room and L LE pain)  Pain Pain Assessment Pain Score: 8  Pain Type: Acute pain Pain Location: Leg Pain Orientation: Left;Posterior Pain Descriptors / Indicators: Spasm Pain Onset: Sudden Pain Intervention(s): Cold applied;Repositioned Home Living/Prior Functioning Home Living Available Help at Discharge: Family(nephew to stay with pt and spouse total 3 weeks) Type of Home: House Home Access: Stairs to enter Technical brewer of Steps: 2 Entrance Stairs-Rails: None Home Layout: Two level;Bed/bath upstairs Alternate Level Stairs-Number of Steps: flight Alternate Level Stairs-Rails: Right;Left;Can reach both Bathroom Shower/Tub: Walk-in shower;Tub/shower unit(tub/shower on main, walk in shower upstairs) Bathroom Toilet: Handicapped height Bathroom Accessibility: Yes  Lives With: Spouse Prior Function Level of Independence: Independent with basic ADLs;Independent with transfers;Independent with homemaking with ambulation;Independent with gait  Able to Take Stairs?: Yes Driving: Yes Vocation: Retired Comments: Spouse with mild dementia Vision/Perception  Perception Perception: Within  Functional Limits Praxis Praxis: Intact  Cognition Overall Cognitive Status: Impaired/Different from baseline Arousal/Alertness: Awake/alert Orientation Level: Oriented X4 Memory: Impaired Memory Impairment: Decreased short term memory Decreased Short Term Memory: Verbal complex;Functional complex Awareness: Impaired Awareness Impairment: Anticipatory impairment Problem Solving: Impaired Problem Solving Impairment: Verbal complex;Functional complex Behaviors: Impulsive Safety/Judgment: Impaired Comments: Decreased safety awareness and awareness of deficits Sensation Sensation Light Touch: Appears Intact Coordination Gross Motor Movements are Fluid and Coordinated: No Coordination and Movement Description: Imapired due to L LE ROM and WBing limitations Motor  Motor Motor: Other (comment) Motor - Skilled Clinical Observations: Generalized weakness, hesitency with movement 2/2 expecting pain  Mobility Bed Mobility Bed Mobility: Supine to Sit;Sitting - Scoot to [employer] of Bed;Sit to Supine;Rolling Right Rolling Right: Minimal Assistance - Patient > 75% Supine to Sit: Moderate Assistance - Patient 50-74% Sitting - Scoot to Edge of Bed: Minimal Assistance - Patient > 75% Sit to Supine: Minimal Assistance - Patient > 75% Transfers Transfers: Sit to [employer] Transfers Sit to Stand: Moderate Assistance - Patient 50-74%(with walker) Stand Pivot Transfers: Moderate Assistance - Patient 50 - 74%(without walker) Stand Pivot Transfer Details (indicate cue type and reason): lifting assist from chair especially if lower,  patient with difficulty with safety for stand pivot and without walker needed increased assist for safety. Transfer (Assistive device): Manufacturing systems engineer Ambulation: Yes Gait Assistance: Minimal Assistance - Patient > 75% Gait Distance (Feet): 20 Feet(x 2) Gait Assistance Details: due to pain/weakness L LE would not be able to walk without walker,   Patient slides L  foot on floor with her slippers from home.  spouse attempting to assist, needed cues to let PT assist for safety and evaluation Stairs / Additional Locomotion Stairs: No Wheelchair Mobility Wheelchair Mobility: Yes Wheelchair Assistance: Minimal assistance - Patient >75% Wheelchair Propulsion: Both upper extremities Wheelchair Parts Management: Needs assistance Distance: 100'  Trunk/Postural Assessment  Cervical Assessment Cervical Assessment: Within Functional Limits Thoracic Assessment Thoracic Assessment: Exceptions to WFL(kyphotic) Lumbar Assessment Lumbar Assessment: Exceptions to WFL(posterior pelvic tilt) Postural Control Postural Control: Deficits on evaluation  Balance Static Sitting Balance Static Sitting - Balance Support: Feet supported;No upper extremity supported Static Sitting - Level of Assistance: 5: Stand by assistance Dynamic Sitting Balance Dynamic Sitting - Balance Support: During functional activity;Feet supported;No upper extremity supported Dynamic Sitting - Level of Assistance: 4: Min assist Dynamic Sitting Balance - Compensations: difficulty reaching down with L LE pain, but unsafely attempting to do things on her own Static Standing Balance Static Standing - Balance Support: During functional activity;Right upper extremity supported;Left upper extremity supported Static Standing - Level of Assistance: 4: Min assist Static Standing - Comment/# of Minutes: standing with UE support assist for balance/safety Dynamic Standing Balance Dynamic Standing - Balance Support: During functional activity;Right upper extremity supported Dynamic Standing - Level of Assistance: 3: Mod assist;4: Min assist Dynamic Standing - Comments: after toileting for hygiene Extremity Assessment      RLE Assessment RLE Assessment: Within Functional Limits LLE Assessment LLE Assessment: Exceptions to [facility] Active Range of Motion (AROM) Comments: limited ankle DF as  compared to R , knee flexion to approx 75 degrees General Strength Comments: hip flexion <3/5, knee extension 3-/5, anlke DF 4-/5    Refer to Care Plan for Long Term Goals  Recommendations for other services: None   Discharge Criteria: Patient will be discharged from PT if patient refuses treatment 3 consecutive times without medical reason, if treatment goals not met, if there is a change in medical status, if patient makes no progress towards goals or if patient is discharged from hospital.  The above assessment, treatment plan, treatment alternatives and goals were discussed and mutually agreed upon: by patient  Reginia Naas 10/25/2018, 6:03 PM  Magda Kiel, PT 10/25/2018

## 2018-10-25 NOTE — Progress Notes (Signed)
Per nursing, patient was given "Data Collection Information Summary for Patients in Inpatient Rehabilitation Facilities with attached Privacy Act Statement Health Care Records" upon admission.    Patient information reviewed and entered into eRehab System by Becky Saleah Rishel, PPS coordinator. Information including medical coding, function ability, and quality indicators will be reviewed and updated through discharge.   

## 2018-10-25 NOTE — Progress Notes (Signed)
This nurse was called by primary nurse to assist with patient attempting to take home medication.  Patients family brought her magnesium citrate for her to take for constipation.  Upon entering the room, patient was very defensive about being able to keep it and drink it.  I inquired about her normal bowel pattern and she said she had a BM yesterday but "it wasn't a good on."  Documentation in computer described a large, type 6 BM yesterday by nursing staff.  She states she usually takes medication on a weekly basis at home due to a "complicated bowel."  She is also taking Linzess, senna, and miralax already.  Informed patient of hospital policy on not taking home medications unless approved by MD for safety purposes.  She stated she didn't care about hospital policy or what our doctors thought about it.  Educated her on the risks of taking such laxatives on a regular basis.  She was resistant to teaching and even said "if you have to kick me out, then do it."  Spoke with Algis Liming, PA who recommended not taking mag citrate and adjusting other medications to regulate bowel pattern.  She still refused and insisted on self-administering the medication.  Notified Algis Liming, PA of conversation.  Brita Romp, RN

## 2018-10-25 NOTE — Progress Notes (Signed)
Called by primary nurse and NT who report smell of cigarette smoke coming from patients bathroom.  Upon entering patients bathroom, distinct smell of cigarette smoke evident.  Asked patient and visitors if any of them had smoked in the bathroom and everyone declined.  Discussed [facility] being a smoke-free campus.  Will continue to monitor.  Brita Romp, RN

## 2018-10-25 NOTE — Care Management Note (Signed)
Inpatient [facility] Individual Statement of Services  Patient Name:  [name]  Date:  10/25/2018  Welcome to the [facility].  Our goal is to provide you with an individualized program based on your diagnosis and situation, designed to meet your specific needs.  With this comprehensive rehabilitation program, you will be expected to participate in at least 3 hours of rehabilitation therapies Monday-Friday, with modified therapy programming on the weekends.  Your rehabilitation program will include the following services:  Physical Therapy (PT), Occupational Therapy (OT), 24 hour per day rehabilitation nursing, Therapeutic Recreaction (TR), Neuropsychology, Case Management (Social Worker), Rehabilitation Medicine, Nutrition Services and Pharmacy Services  Weekly team conferences will be held on Wednesday to discuss your progress.  Your Social Worker will talk with you frequently to get your input and to update you on team discussions.  Team conferences with you and your family in attendance may also be held.  Expected length of stay: 7-10 days Overall anticipated outcome: supervision level with cues  Depending on your progress and recovery, your program may change. Your Social Worker will coordinate services and will keep you informed of any changes. Your Social Worker's name and contact numbers are listed  below.  The following services may also be recommended but are not provided by the [facility] will be made to provide these services after discharge if needed.  Arrangements include referral to agencies that provide these services.  Your insurance has been verified to be:  [place] Your primary doctor is:  Merri Ray  Pertinent information will be shared with your doctor and your insurance  company.  Social Worker:  Ovidio Kin, [place] or ([phone]  Information discussed with and copy given to patient by: Elease Hashimoto, 10/25/2018, 10:40 AM

## 2018-10-26 ENCOUNTER — Inpatient Hospital Stay (HOSPITAL_COMMUNITY): Payer: Medicare Other | Admitting: Physical Therapy

## 2018-10-26 DIAGNOSIS — I739 Peripheral vascular disease, unspecified: Secondary | ICD-10-CM

## 2018-10-26 MED ORDER — AMLODIPINE BESYLATE 5 MG PO TABS
5.0000 mg | ORAL_TABLET | Freq: Every day | ORAL | Status: DC
  Administered 2018-10-26: 5 mg via ORAL
  Filled 2018-10-26: qty 1

## 2018-10-26 MED ORDER — ALUM & MAG HYDROXIDE-SIMETH 200-200-20 MG/5ML PO SUSP: 30.0000 mL | Freq: Four times a day (QID) | ORAL | Status: DC | PRN

## 2018-10-26 MED ORDER — SIMETHICONE 80 MG PO CHEW
80.0000 mg | CHEWABLE_TABLET | Freq: Four times a day (QID) | ORAL | Status: DC | PRN
  Administered 2018-10-26 (×2): 80 mg via ORAL
  Filled 2018-10-26 (×3): qty 1

## 2018-10-26 NOTE — Progress Notes (Signed)
[facility] PHYSICAL MEDICINE & REHABILITATION PROGRESS NOTE  Subjective/Complaints: Pt up in bed. Wanted me to see her left leg which had some drainage yesterday and this morning.no new pain. No fever  ROS: Patient denies fever, rash, sore throat, blurred vision, nausea, vomiting, diarrhea, cough, shortness of breath or chest pain, joint or back pain, headache, or mood change.   Objective: Vital Signs: Blood pressure (!) 172/75, pulse 69, temperature 98.4 F (36.9 C), temperature source Oral, resp. rate 16, height 5\' 8"  (1.727 m), weight 81.8 kg, SpO2 95 %. No results found. Recent Labs    10/25/18 0508  WBC 8.2  HGB 9.7*  HCT 31.3*  PLT 161   Recent Labs    10/25/18 0508  NA 135  K 3.9  CL 96*  CO2 31  GLUCOSE 108*  BUN 7*  CREATININE 0.83  CALCIUM 8.7*    Physical Exam: BP (!) 172/75 (BP Location: Left Arm)   Pulse 69   Temp 98.4 F (36.9 C) (Oral)   Resp 16   Ht 5\' 8"  (1.727 m)   Wt 81.8 kg   SpO2 95%   BMI 27.42 kg/m  Constitutional: No distress . Vital signs reviewed. HEENT: EOMI, oral membranes moist Neck: supple Cardiovascular: RRR without murmur. No JVD    Respiratory: CTA Bilaterally without wheezes or rales. Normal effort    GI: BS +, non-tender, non-distended  Musc: Tenderness proximal left lower extremity Neuro: Alert. Motor: 5/5 in bilateral deltoid, bicep, tricep, grip 5/5  RLE: 5/5 proximal distal LLE: Hip flexion 2/5, knee extension 3/5 clinical dorsiflexion 5/5 (pain inhibition) Skinleft femoropopliteal surgical site with small area of s/s drainage on dressing. Otherwise incision CDI. No surrounding erythema. Small area of sq bruising/blood proximally along incision.   Assessment/Plan: 1. Functional deficits secondary to debility which require 3+ hours per day of interdisciplinary therapy in a comprehensive inpatient rehab setting.  Physiatrist is providing close team supervision and 24 hour management of active medical problems listed  below.  Physiatrist and rehab team continue to assess barriers to discharge/monitor patient progress toward functional and medical goals  Care Tool:  Bathing    Body parts bathed by patient: Left arm, Right arm, Chest, Abdomen, Front perineal area, Buttocks, Right upper leg, Left upper leg, Face   Body parts bathed by helper: Right lower leg, Left lower leg     Bathing assist Assist Level: Moderate Assistance - Patient 50 - 74%     Upper Body Dressing/Undressing Upper body dressing   What is the patient wearing?: Hospital gown only    Upper body assist Assist Level: Supervision/Verbal cueing    Lower Body Dressing/Undressing Lower body dressing      What is the patient wearing?: Hospital gown only     Lower body assist       Toileting Toileting    Toileting assist Assist for toileting: Independent with assistive device     Transfers Chair/bed transfer  Transfers assist     Chair/bed transfer assist level: Moderate Assistance - Patient 50 - 74%     Locomotion Ambulation   Ambulation assist   Ambulation activity did not occur: Safety/medical concerns(unable to walk without walker)  Assist level: Minimal Assistance - Patient > 75% Assistive device: Walker-rolling Max distance: 20'   Walk 10 feet activity   Assist  Walk 10 feet activity did not occur: Safety/medical concerns  Assist level: Minimal Assistance - Patient > 75% Assistive device: Walker-rolling   Walk 50 feet activity   Assist  Walk 50 feet with 2 turns activity did not occur: Safety/medical concerns         Walk 150 feet activity   Assist Walk 150 feet activity did not occur: Safety/medical concerns         Walk 10 feet on uneven surface  activity   Assist Walk 10 feet on uneven surfaces activity did not occur: Safety/medical concerns         Wheelchair     Assist   Type of Wheelchair: Manual    Wheelchair assist level: Minimal Assistance - Patient >  75% Max wheelchair distance: 100'    Wheelchair 50 feet with 2 turns activity    Assist        Assist Level: Minimal Assistance - Patient > 75%   Wheelchair 150 feet activity     Assist Wheelchair 150 feet activity did not occur: Safety/medical concerns          Medical Problem List and Plan: 1.Debilitysecondary to deficits from prior CVA as well as peripheral vascular disease status post left lower extremity BPG 10/22/2018 with ongoing sensory loss, pain and weakness left lower extremity  Continue therapies 2. DVT Prophylaxis/Anticoagulation: Subcutaneous Lovenox 3. Pain Management:Oxycodone as needed 4. Mood/bipolar disorder. Lamictal 150 mg daily,Klonopin 0.5 mg daily at bedtime, Cymbalta 60 mg daily and 30 mg every evening 5. Neuropsych: This patientiscapable of making decisions on herown behalf. 6. Skin/Wound Care:?small pocket of s/s drainage which opened yesterday. Seems to be decreasing. Incision looks clean/appropriate otherwise. Continue dry dressing forr now 7. Fluids/Electrolytes/Nutrition:Routine and ins and outs  BMP within acceptable range on 2/14 8.History of CVA. Resume aspirin 9. Hypertension. Patient on Norvasc 10 mg daily prior to admission.    -resume norvasc 5mg  today 10. History of AAA. Follow-up outpatient 11. Tobacco abuse. Provide counseling 12. Hyperlipidemia. Lipitor 13. Constipation. Laxative assistance 14.  Hypoalbuminemia  Supplement initiated on 2/14 15.  Acute blood loss anemia  Hemoglobin 9.7 on 2/14  Continue to monitor  LOS: 2 days A FACE TO FACE EVALUATION WAS PERFORMED  [name] 10/26/2018, 8:22 AM

## 2018-10-26 NOTE — Progress Notes (Signed)
Physical Therapy Session Note  Patient Details  Name: [name] MRN: [id number] Date of Birth: 02-14-1946  Today's Date: 10/26/2018 PT Individual Time: 1405-1505 PT Individual Time Calculation (min): 60 min   Short Term Goals: Week 1:  PT Short Term Goal 1 (Week 1): STG=LTG due to ELOS  Skilled Therapeutic Interventions/Progress Updates:   Pt in supine and agreeable to therapy, no c/o pain but reported increased LLE stiffness and tightness. Supervision bed mobility and min assist transfer to w/c. Self-propelled w/c to/from therapy gym w/ supervision using BUEs for endurance training. Ambulated 61' and 20' w/ RW and CGA. Worked on LLE ROM and strengthening in between gait bouts including LAQs 3x10, heel slides 3x5, knee march 3x5, and ankle pumps 2x10. Pt w/ increasing c/o incision feeling "hot", RN made aware. Needed prolonged seated rest breaks throughout session 2/2 discomfort in LLE w/ activity, pt described it as tightness and did not rate. Returned to room and pt requesting to toilet. Ambulated to/from toilet w/ CGA, performed pericare w/ supervision. Ended session in supine, all needs in reach.   Therapy Documentation Precautions:  Precautions Precautions: Fall Restrictions Weight Bearing Restrictions: Yes LLE Weight Bearing: Weight bearing as tolerated Vital Signs: Therapy Vitals Temp: 98.6 F (37 C) Temp Source: Oral Pulse Rate: 69 Resp: 18 BP: (!) 160/70 Patient Position (if appropriate): Sitting Oxygen Therapy SpO2: 98 % O2 Device: Room Air  Therapy/Group: Individual Therapy  Eriana Suliman Clent Demark 10/26/2018, 3:07 PM

## 2018-10-26 NOTE — Progress Notes (Addendum)
Patient refusing lovenox injection. Patient reporting "it hurts and I don't need it". Patient educated on need for lovenox while in hospital and what it helps prevent. Patient continues to refuse. MD Naaman Plummer made aware of patient's refusal. Patient reporting some pain and swelling in left leg after therapy. Patient back in bed with Lt leg elevated. Continue to monitor.

## 2018-10-26 NOTE — Plan of Care (Signed)
  Problem: Consults Goal: RH STROKE PATIENT EDUCATION Description See Patient Education module for education specifics  Outcome: Progressing   Problem: RH SKIN INTEGRITY Goal: RH STG SKIN FREE OF INFECTION/BREAKDOWN Description Pt will be able to prevent breakdown by moving in bed with min assist while in rehab.   Outcome: Progressing Goal: RH STG ABLE TO PERFORM INCISION/WOUND CARE W/ASSISTANCE Description STG Able To Perform Incision/Wound Care With [employer].  Outcome: Progressing   Problem: RH PAIN MANAGEMENT Goal: RH STG PAIN MANAGED AT OR BELOW PT'S PAIN GOAL Description < 3 out of 10.   Outcome: Progressing

## 2018-10-26 NOTE — Progress Notes (Addendum)
When nurse entered patient room to admin bedtime medication patient requested sleep medication. This Probation officer explained to patient that she did not have anything for sleep and writer begin to explain to her the medication she had and offered pain med and patient accepted. Writer also explained that I would speak to the MD about something to help her sleep at night. (Patient's been self medicating with medications from home from friends/family members). Writer noticed two bottles of magnesium citrate at the bedside and one was empty. Patient refused to hand them over and became very upset and use several four letter words.  Patient begin to use profanity when writer asked her to use gripper socks instead of her bedroom slippers when she requested to used the bathroom. Patient states I have been using them for three nights now. I tried to explain her safety to her, but she refused to listen. Writer asked NT to step in and assist with care. Patient refuses to wear her gripper socks and she still has medication at her bedside that she refuses to hand over at this time.......Marland KitchenDKearseLPN

## 2018-10-27 ENCOUNTER — Inpatient Hospital Stay (HOSPITAL_COMMUNITY): Payer: Medicare Other

## 2018-10-27 MED ORDER — TRAZODONE HCL 50 MG PO TABS
50.0000 mg | ORAL_TABLET | Freq: Every evening | ORAL | Status: DC | PRN
  Administered 2018-10-27 – 2018-10-28 (×2): 50 mg via ORAL
  Filled 2018-10-27 (×2): qty 1

## 2018-10-27 MED ORDER — AMLODIPINE BESYLATE 10 MG PO TABS
10.0000 mg | ORAL_TABLET | Freq: Every day | ORAL | Status: DC
  Administered 2018-10-28 – 2018-10-29 (×2): 10 mg via ORAL
  Filled 2018-10-27 (×2): qty 1

## 2018-10-27 NOTE — Plan of Care (Signed)
  Problem: Consults Goal: RH STROKE PATIENT EDUCATION Description See Patient Education module for education specifics  Outcome: Progressing   Problem: RH SKIN INTEGRITY Goal: RH STG SKIN FREE OF INFECTION/BREAKDOWN Description Pt will be able to prevent breakdown by moving in bed with min assist while in rehab.   Outcome: Progressing Goal: RH STG ABLE TO PERFORM INCISION/WOUND CARE W/ASSISTANCE Description STG Able To Perform Incision/Wound Care With [employer].  Outcome: Progressing   Problem: RH PAIN MANAGEMENT Goal: RH STG PAIN MANAGED AT OR BELOW PT'S PAIN GOAL Description < 3 out of 10.   Outcome: Progressing

## 2018-10-27 NOTE — Progress Notes (Signed)
This RN heard patient's bed alarm went off. On arrival to room, patient was at the edge of the bed holding her walker barefooted being assisted by husband. RN stopped the couple, don yellow grip socks and assisted patient to the bathroom. RN reminded patient and husband to call for assistance from staff for mobility. They were reminded of safety issues and family members needed to be trained and cleared by therapists before any attempt to assist patient on any kind of transfer.

## 2018-10-27 NOTE — Progress Notes (Signed)
Patient refused Lovenox injection during shift. Education provided on use and benefits. Patient able to verbalize understanding back to nurse. [place] A Ma Munoz,LPN

## 2018-10-27 NOTE — Progress Notes (Signed)
[place] PHYSICAL MEDICINE & REHABILITATION PROGRESS NOTE  Subjective/Complaints: Overall patient doing fairly well.  Did have some anxiety last night and struggled with sleep.  Also had some bloating yesterday and remains a bit anxious about her incision.  ROS: Patient denies fever, rash, sore throat, blurred vision, nausea, vomiting, diarrhea, cough, shortness of breath or chest pain,  back pain, headache, or mood change.    Objective: Vital Signs: Blood pressure (!) 174/76, pulse (!) 59, temperature 98.9 F (37.2 C), temperature source Oral, resp. rate 16, height 5\' 8"  (1.727 m), weight 81.8 kg, SpO2 100 %. No results found. Recent Labs    10/25/18 0508  WBC 8.2  HGB 9.7*  HCT 31.3*  PLT 161   Recent Labs    10/25/18 0508  NA 135  K 3.9  CL 96*  CO2 31  GLUCOSE 108*  BUN 7*  CREATININE 0.83  CALCIUM 8.7*    Physical Exam: BP (!) 174/76 (BP Location: Right Arm)   Pulse (!) 59   Temp 98.9 F (37.2 C) (Oral)   Resp 16   Ht 5\' 8"  (1.727 m)   Wt 81.8 kg   SpO2 100%   BMI 27.42 kg/m  Constitutional: No distress . Vital signs reviewed. HEENT: EOMI, oral membranes moist Neck: supple Cardiovascular: RRR without murmur. No JVD    Respiratory: CTA Bilaterally without wheezes or rales. Normal effort    GI: BS +, non-tender, non-distended  Musc: Tenderness proximal left lower extremity Neuro: Alert. Motor: 5/5 in bilateral deltoid, bicep, tricep, grip 5/5  RLE: 5/5 proximal distal LLE: Hip flexion 2/5, knee extension 3/5 clinical dorsiflexion 5/5--still with pain inhibition Skinleft femoropopliteal surgical site with mild serosanguineous drainage.  Incision remains clean and intact with no surrounding erythema.  There is a small area of bruising proximally along incision.  Patient's ankle is chronically pink/red in appearance due to prior wounds and scar tissue. Psych: Pleasant but slightly anxious   Assessment/Plan: 1. Functional deficits secondary to debility  which require 3+ hours per day of interdisciplinary therapy in a comprehensive inpatient rehab setting.  Physiatrist is providing close team supervision and 24 hour management of active medical problems listed below.  Physiatrist and rehab team continue to assess barriers to discharge/monitor patient progress toward functional and medical goals  Care Tool:  Bathing    Body parts bathed by patient: Right arm, Left arm, Chest, Abdomen   Body parts bathed by helper: Buttocks     Bathing assist Assist Level: Minimal Assistance - Patient > 75%     Upper Body Dressing/Undressing Upper body dressing   What is the patient wearing?: Hospital gown only    Upper body assist Assist Level: Supervision/Verbal cueing    Lower Body Dressing/Undressing Lower body dressing      What is the patient wearing?: Hospital gown only     Lower body assist       Toileting Toileting    Toileting assist Assist for toileting: Minimal Assistance - Patient > 75%     Transfers Chair/bed transfer  Transfers assist     Chair/bed transfer assist level: Minimal Assistance - Patient > 75%     Locomotion Ambulation   Ambulation assist   Ambulation activity did not occur: Safety/medical concerns(unable to walk without walker)  Assist level: Contact Guard/Touching assist Assistive device: Walker-rolling Max distance: 40'   Walk 10 feet activity   Assist  Walk 10 feet activity did not occur: Safety/medical concerns  Assist level: Contact Guard/Touching assist Assistive  device: Walker-rolling   Walk 50 feet activity   Assist Walk 50 feet with 2 turns activity did not occur: Safety/medical concerns         Walk 150 feet activity   Assist Walk 150 feet activity did not occur: Safety/medical concerns         Walk 10 feet on uneven surface  activity   Assist Walk 10 feet on uneven surfaces activity did not occur: Safety/medical concerns          Wheelchair     Assist   Type of Wheelchair: Manual    Wheelchair assist level: Supervision/Verbal cueing Max wheelchair distance: 150'    Wheelchair 50 feet with 2 turns activity    Assist        Assist Level: Supervision/Verbal cueing   Wheelchair 150 feet activity     Assist Wheelchair 150 feet activity did not occur: Safety/medical concerns   Assist Level: Supervision/Verbal cueing      Medical Problem List and Plan: 1.Debilitysecondary to deficits from prior CVA as well as peripheral vascular disease status post left lower extremity BPG 10/22/2018 with ongoing sensory loss, pain and weakness left lower extremity  Continue therapies 2. DVT Prophylaxis/Anticoagulation: Subcutaneous Lovenox 3. Pain Management:Oxycodone as needed 4. Mood/bipolar disorder. Lamictal 150 mg daily,Klonopin 0.5 mg daily at bedtime, Cymbalta 60 mg daily and 30 mg every evening 5. Neuropsych: This patientiscapable of making decisions on herown behalf. 6. Skin/Wound Care:Continued mild serosanguineous drainage from medial and portion of incision.  No other concerning signs on exam however.  I advised the patient that we will continue to closely follow the wound and involve surgery if needed.  Keep wound dressed and dry otherwise for now. 7. Fluids/Electrolytes/Nutrition:Routine and ins and outs  BMP within acceptable range on 2/14 8.History of CVA. Resume aspirin 9. Hypertension. Patient on Norvasc 10 mg daily prior to admission.    -resumed norvasc 5mg  on 3/50, continue systolic blood pressure elevation--increase to 10 mg per her home regimen. 10. History of AAA. Follow-up outpatient 11. Tobacco abuse. Provide counseling 12. Hyperlipidemia. Lipitor 13. Constipation. Laxative assistance 14.  Hypoalbuminemia  Supplement initiated on 2/14 15.  Acute blood loss anemia  Hemoglobin 9.7 on 2/14  Continue to monitor 16.  Insomnia: Add trazodone 50 mg nightly as  needed  LOS: 3 days A FACE TO FACE EVALUATION WAS PERFORMED  [name] 10/27/2018, 8:05 AM

## 2018-10-27 NOTE — Progress Notes (Signed)
Physical Therapy Session Note  Patient Details  Name: [name] MRN: [id number] Date of Birth: 1946-07-25  Today's Date: 10/27/2018 PT Individual Time: 9702-6378 and 1300-1400 PT Individual Time Calculation (min): 60 min  And 60 min  Short Term Goals: Week 1:  PT Short Term Goal 1 (Week 1): STG=LTG due to ELOS      Skilled Therapeutic Interventions/Progress Updates:   tx 1:  Pt resting in bed, stating that she was tired from previous OT session.   Strengthening in supine: 10 x 1 each R straight leg raises, L active assistive leg raises, bil quad sets, R hip abduction/adduction, L active assist hip abduction/adduction,  bil hip internal rotation; 25 x 1 alternating ankle pumps.   Rolling in bed and sitting up EOB with supervision and extra time.  Stand pivot transfer bed> w/c to L with min assist, mod cues for safety with hand placement.  Simulated car transfer with mod/max assist for safe sit> stand, wt shifting, managing LLE.   W/c> bed stand pivot transfer with min assist, mod cues as above.  Pt left resting with needs at hand and bed alarm set.  tx 2:  Pt siting up in w/c, having just gone to [place] with [place], Therapist, sports.  Pt iniitally stated that she could not participate in tx due to pain, rated 8/10.  PT consulted [place], who gave pain meds.  Pt's nephew Shanon Brow is here.  He was very hostile regarding staff not ackowledging his role in helping pt after d/c.  He is a retired Therapist, sports.  PT explained the role of HHPT and OT at d/c.    Strengthening/flexibility , from w/c level , using KInetron at level 70 cu/sec x 30 cycles x 2.  PT educated pt on disphragmatic breathing, using quick sniffs to activate diaphragm, with fair carry-over.    PT educated nephew and husband about leg rests mgt on w/c.  They need more practice..  Gait training with RW over level tile x 21', x 30' with min guard assist.  Cues for safe hand placement sit>< stand with min assist, and use of RW.  Up/down  (2) 3" high steps, ascending backwards, descending forwards, with bil UE support, step -to method.  Mod cues for sequencing.  Pt left resting in w/c with needs at hand and family present. PT provided ice pack for posterior knee, with leg rest elevated and pillow under knee.  PT snipped elastic of non slip socks as they are too tight around ankle.  Shanon Brow asked about being "checked off" on safety plan for transferring his aunt.  PT gently explained the process, and that pt is not ready for family ed yet.  Shanon Brow became very hostile again.  PT reported this to [place], Therapist, sports.     Therapy Documentation Precautions:  Precautions Precautions: Fall Restrictions Weight Bearing Restrictions: Yes LLE Weight Bearing: Weight bearing as tolerated  Pain:  AM tx-denies, at rest; LLE sore with movement; PT recommended pt ask for ice pack LLE at end of session. PM tx- rated 8/10 L LE at beginning of session due to toileting, 4/10 by end of session; ice pack to back of knee at end of session      Therapy/Group: Individual Therapy  Kimi Kroft 10/27/2018, 10:49 AM

## 2018-10-27 NOTE — Progress Notes (Signed)
Occupational Therapy Session Note  Patient Details  Name: [name] MRN: [id number] Date of Birth: 17-Mar-1946  Today's Date: 10/27/2018 OT Individual Time: 2542-7062 OT Individual Time Calculation (min): 70 min    Short Term Goals: Week 1:  OT Short Term Goal 1 (Week 1): Pt will complete 3/3 toileting tasks with CGA OT Short Term Goal 2 (Week 1): Pt will stand to complete 2 grooming tasks with CGA in order to increase functional standing balance and endurance OT Short Term Goal 3 (Week 1): Pt will complete LB dressing with CGA using AE PRN  Skilled Therapeutic Interventions/Progress Updates:    Pt received supine in bed eating breakfast, requesting OT to return when she was finished eating. 10 min missed initially. No c/o pain. Pt completed bed mobility with (S), increased time for LLE management. Pt completed 5 ft functional mobility with CGA to sink. Pt completed UB bathing and dressing with (S) sitting at sink. CGA in standing to complete peri hygiene. Pt completed 100 ft of functional mobility with RW, occasional cueing for RW management. Pt sat EOM in therapy gym and then completed several sets of standing level functional stepping with focus on increasing weight bearing through LLE. Cueing provided for decreased UE dependence on RW. Pt c/o back pain sitting EOM, so a large therapy ball was placed posteriorly and pt was guided through gentle back extension and B pectoralis extension with manual facilitation. Pt reported pain relief. Pt returned to room and completed oral care in standing with (S). Pt left supine with all needs met, bed alarm set   Therapy Documentation Precautions:  Precautions Precautions: Fall Restrictions Weight Bearing Restrictions: Yes LLE Weight Bearing: Weight bearing as tolerated  Therapy/Group: Individual Therapy  Curtis Sites 10/27/2018, 7:42 AM

## 2018-10-27 NOTE — Progress Notes (Signed)
Patients nephew(David)  came in to visit during shift. During visit the nephew  Shanon Brow) started getting irritated with not being able to come and assist patient at time. Patient informed him to "give [place] a moment." Once this writer left out the room and walked pass Shanon Brow he started yelling in the hallway and became hostile with this Probation officer. I was unable to explain the therapy process for getting checked off to safely transfer patient. Charge informed of situation at time. [place] A Zafiro Routson,LPN

## 2018-10-28 ENCOUNTER — Encounter (HOSPITAL_COMMUNITY): Payer: Medicare Other | Admitting: Psychology

## 2018-10-28 ENCOUNTER — Inpatient Hospital Stay (HOSPITAL_COMMUNITY): Payer: Medicare Other | Admitting: Physical Therapy

## 2018-10-28 ENCOUNTER — Encounter: Payer: Self-pay | Admitting: *Deleted

## 2018-10-28 ENCOUNTER — Inpatient Hospital Stay (HOSPITAL_COMMUNITY): Payer: Medicare Other | Admitting: Occupational Therapy

## 2018-10-28 DIAGNOSIS — G479 Sleep disorder, unspecified: Secondary | ICD-10-CM

## 2018-10-28 DIAGNOSIS — G8918 Other acute postprocedural pain: Secondary | ICD-10-CM

## 2018-10-28 DIAGNOSIS — Z720 Tobacco use: Secondary | ICD-10-CM

## 2018-10-28 MED ORDER — OXYCODONE-ACETAMINOPHEN 5-325 MG PO TABS: 1.0000 | ORAL_TABLET | ORAL | 0 refills | Status: DC | PRN

## 2018-10-28 MED ORDER — CEPHALEXIN 250 MG PO CAPS
500.0000 mg | ORAL_CAPSULE | Freq: Four times a day (QID) | ORAL | Status: DC
  Administered 2018-10-28 – 2018-10-29 (×5): 500 mg via ORAL
  Filled 2018-10-28 (×5): qty 2

## 2018-10-28 MED ORDER — AMLODIPINE BESYLATE 10 MG PO TABS: 10.0000 mg | ORAL_TABLET | Freq: Every day | ORAL | 4 refills | Status: DC

## 2018-10-28 MED ORDER — LAMOTRIGINE 150 MG PO TABS
150.0000 mg | ORAL_TABLET | Freq: Two times a day (BID) | ORAL | Status: DC
  Administered 2018-10-28 – 2018-10-29 (×2): 150 mg via ORAL
  Filled 2018-10-28 (×3): qty 1

## 2018-10-28 MED ORDER — CEPHALEXIN 500 MG PO CAPS: 500.0000 mg | ORAL_CAPSULE | Freq: Four times a day (QID) | ORAL | 0 refills | Status: DC

## 2018-10-28 MED ORDER — LINACLOTIDE 145 MCG PO CAPS: 145.0000 ug | ORAL_CAPSULE | Freq: Every day | ORAL | 3 refills | Status: DC

## 2018-10-28 MED ORDER — CLONAZEPAM 0.5 MG PO TABS: ORAL_TABLET | ORAL | 0 refills | Status: DC

## 2018-10-28 MED ORDER — ATORVASTATIN CALCIUM 40 MG PO TABS: 40.0000 mg | ORAL_TABLET | Freq: Every day | ORAL | 5 refills | Status: DC

## 2018-10-28 NOTE — Patient Care Conference (Signed)
Inpatient RehabilitationTeam Conference and Plan of Care Update Date: 10/29/2018   Time: 1:00 PM    Patient Name: [name]      Medical Record Number: [id number]  Date of Birth: March 08, 1946 Sex: Female         Room/Bed: 4M06C/4M06C-01 Payor Info: Payor: MEDICARE / Plan: MEDICARE PART A AND B / Product Type: *No Product type* /    Admitting Diagnosis: LLE BPG  Admit Date/Time:  10/24/2018  2:15 PM Admission Comments: No comment available   Primary Diagnosis:  <principal problem not specified> Principal Problem: <principal problem not specified>  Patient Active Problem List   Diagnosis Date Noted  . Postoperative pain   . Sleep disturbance   . Tobacco abuse   . Acute blood loss anemia   . Hypoalbuminemia due to protein-calorie malnutrition ([place])   . Debility 10/24/2018  . Pre-operative cardiovascular examination 09/26/2018  . Dyslipidemia, goal LDL below 70 09/26/2018  . GAD (generalized anxiety disorder) 08/07/2018  . Major depressive disorder, single episode 08/07/2018  . Appendiceal abscess 07/08/2018  . PAD (peripheral artery disease) ([place]) 03/15/2018  . Atherosclerosis of native artery of left lower extremity with intermittent claudication ([place]) 03/15/2018  . Chronic left-sided low back pain with left-sided sciatica 12/25/2017  . Facial droop   . History of CVA (cerebrovascular accident) 10/23/2017  . Critical lower limb ischemia 11/08/2016  . Smoker 11/08/2016  . Postinflammatory pulmonary fibrosis ([place]) 02/14/2016  . Cigarette smoker 12/24/2015  . Hypothyroid ? 01/16/2014  . Mild cognitive impairment 01/16/2014  . Meningioma ([place]) 10/21/2013  . Chest pain 10/20/2013  . Medicare annual wellness visit, subsequent 06/16/2013  . Dizziness and giddiness 06/16/2013  . RLS (restless legs syndrome) 04/29/2012  . Abdominal pain 12/27/2010  . DEGENERATIVE JOINT DISEASE 09/23/2010  . CAROTID ARTERY DISEASE 08/15/2010  . CHEST PAIN 08/15/2010  . HEADACHE 12/02/2009  .  BACK PAIN 11/22/2009  . CONSTIPATION, CHRONIC 01/29/2008  . NAUSEA 01/28/2008  . DEPRESSION 03/08/2007  . Essential hypertension 03/08/2007    Expected Discharge Date: Expected Discharge Date: 10/29/18  Team Members Present: Physician leading conference: Dr. Delice Lesch Social Worker Present: Ovidio Kin, LCSW Nurse Present: Ellison Carwin, LPN PT Present: Lavone Nian, PT OT Present: Amy Rounds, OT PPS Coordinator present : Gunnar Fusi     Current Status/Progress Goal Weekly Team [place] secondary to deficits from prior CVA as well as peripheral vascular disease status post left lower extremity BPG 10/22/2018 with ongoing sensory loss, pain and weakness left lower extremity  Improve mobility, safety, transfers, BP  See above   Bowel/Bladder        Cont B & B     Swallow/Nutrition/ Hydration             ADL's   CGA-min A functional transfers; steadying assist toileting; mod A LB dressing; min A bathing; mod I grooming from w/c level  Supervision overall  Pt desiring to d/c home prior to completion of program. OT goals not met. Pt to d/c home tomorrow   Mobility   supine>sit with supervision & hospital bed features & leg lifter, min assist sit>stand, CGA stand pivot, ambulates 35 ft with RW & CGA, limited by LLE pain  supervision overall except min assist stair negotiation  pt education, transfers, gait, bed mobility, d/c planning, stair negotiation   Communication             Safety/Cognition/ Behavioral Observations  Pain        Less than 3 being managed by MD     Skin        BPG-LLE monitoring and placing dry dressing on        *See Care Plan and progress notes for long and short-term goals.     Barriers to Discharge  Current Status/Progress Possible Resolutions Date Resolved   Physician    Medical stability     See above  Therapies, optimize BP meds      Nursing                  PT  Home environment access/layout;Decreased  caregiver support  2 steps to enter home without rails              OT Decreased caregiver support;Home environment access/layout  Lives with husband with dementia; master bedroom on 2nd floor             SLP                SW Decreased caregiver support Pt was the caregiver for her husband prior to admission            Discharge Planning/Teaching Needs:    Home with husband who has dementia and nephew how is here for one month to assist them both.      Team Discussion:    Revisions to Treatment Plan:  DC 2/18    Continued Need for Acute Rehabilitation Level of Care: The patient requires daily medical management by a physician with specialized training in physical medicine and rehabilitation for the following conditions: Daily direction of a multidisciplinary physical rehabilitation program to ensure safe treatment while eliciting the highest outcome that is of practical value to the patient.: Yes Daily medical management of patient stability for increased activity during participation in an intensive rehabilitation regime.: Yes Daily analysis of laboratory values and/or radiology reports with any subsequent need for medication adjustment of medical intervention for : Post surgical problems;Wound care problems   I attest that I was present, lead the team conference, and concur with the assessment and plan of the team.   Elease Hashimoto 10/29/2018, 9:47 AM

## 2018-10-28 NOTE — Progress Notes (Addendum)
Social Work Patient ID: [name], female   DOB: 09/01/1946, 73 y.o.   MRN: [id number] Met with pt who requests to go home tomorrow she has her nephew here to assist her and her husband. MD feels medically stable and can go tomorrow. Spoke with [place]-PT regarding this regarding  equipment needs and follow up. She reports nephew will need to bump her up the two steps into home. Plan for discharge tomorrow. Will alert [facility] since they have a post op order to provide services at discharge and pt prefers them.

## 2018-10-28 NOTE — Progress Notes (Addendum)
[facility]  Subjective  - Worried about erythema and edam in the left LE surrounding the vein harvest site medial upper thigh.   Objective (!) 153/76 80 97.7 F (36.5 C) (Oral) 18 98%  Intake/Output Summary (Last 24 hours) at 10/28/2018 1401 Last data filed at 10/28/2018 1215 Gross per 24 hour  Intake 1080 ml  Output -  Net 1080 ml    Erythema surrounding medial upper thigh incision at vein harvest site. Pitting edema left LE Doppler Peroneal/ PT/DP left LE Heart RRR Lungs non labored breathing  Assessment/Planning: 73 y.o.femaleis s/p:  Left superficial femoral artery to below-knee popliteal artery bypass using non-reversed left greater saphenous vein  Cellulitis medial left thigh at vein harvest site with pitting edema  I think she will do fine on oral keflex 500 QID for 14 days. Elevation with slight bend in hip, knee and foot and ankle above level of heart supine multiple times a day for edema.  Alternating with activity. F/U with Dr. Oneida Alar in about 2 weeks as scheduled.  There are no synthetic components to her left LE bypass this was a vein harvest bypass and should respond to oral antibiotics.  Afebrile last WBC was 2/14/20120 9.7. I ordered a cbc for the am.   Roxy Horseman 10/28/2018 2:01 PM --  Laboratory Lab Results: No results for input(s): WBC, HGB, HCT, PLT in the last 72 hours. BMET No results for input(s): NA, K, CL, CO2, GLUCOSE, BUN, CREATININE, CALCIUM in the last 72 hours.  COAG Lab Results  Component Value Date   INR 1.01 10/21/2018   INR 1.15 07/09/2018   INR 1.01 10/23/2017   No results found for: PTT

## 2018-10-28 NOTE — Consult Note (Signed)
  [name]. [name] is a 73 year old female with history of bipolar disorder, AAA, CVA, CAD, hypertension and tobacco abuse with peripheral vascular disease.  The patient presented on 10/22/2018 followed by vascular surgeon for a long history of peripheral vascular disease.  Due to progressive pain to the left foot patient underwent left superficial femoral artery to below-knee arterial bypass.  The patient has been referred to and has been followed by the comprehensive inpatient rehabilitation program.  The patient was referred for neuropsychological consultation due to adjustment and coping issues.  The patient describes a lot of anxiety and stress both residual to her left leg pain and difficulties with her vascular disease as well as issues from a psychosocial standpoint.  The patient's husband has been diagnosed with early stages of Alzheimer's but has been increasingly agitated and aggressive towards her and others for some time now.  The patient is very stressed by these interactions with her husband who is not able to provide help her care for her duties significant degree.  The patient's nephew is down from [place] and will be staying with him for several weeks to help out with her husband as well as the patient when she is discharged.  Today we worked on further coping and adjustment strategies.  The patient denies any significant exacerbation of her underlying bipolar disorder and denies significant depressive symptoms outside of symptoms consistent with her current situational status.  The patient also denies manic episode and describes coping and anxiety as her primary psychological variables.

## 2018-10-28 NOTE — Progress Notes (Signed)
Physical Therapy Discharge Summary  Patient Details  Name: [name] MRN: [id number] Date of Birth: December 27, 1945  Today's Date: 10/28/2018 PT Individual Time: 0903-1000 PT Individual Time Calculation (min): 57 min    Patient has met 4 of 10 long term goals due to pt being non-compliant with therapy recommendations and requesting to d/c home 3 days after admission before completing CIR program and meeting therapy goals. Pt declined to participate in therapy session in which hands-on family training was scheduled to take place due to pain and concerns with her surgical limb being infected. Family was not present during time when therapist was with patient, was contacted later by therapy and report they feel comfortable assisting pt at home despite not being present to observe and complete hands-on training with how to assist pt with transfers and with stairs. Pt and family were provided with a handout for out how bump a w/c up stairs.  Patient to discharge at a wheelchair level [place].   Patient's care partner unavailable during therapy sessions but reports they feel they are able to provide the necessary physical assistance at discharge.   Reasons goals not met: Pt did not complete the CIR program as recommended, instead electing to d/c home early.  Recommendation:  Patient will benefit from ongoing skilled PT services in home health setting to continue to advance safe functional mobility, address ongoing impairments in LLE weakness & ROM, gait, transfers, stair negotiation, pain management, and minimize fall risk.  Equipment: 20x18 w/c, 3-in-1 BSC, hospital bed (pt reports she already has a RW); educated pt on getting leg lifter if she chooses  Reasons for discharge: discharge from hospital per pt request  Patient/family agrees with progress made and goals achieved: Yes  PT Discharge Precautions/Restrictions Precautions Precautions: Fall Restrictions Weight Bearing Restrictions:  No LLE Weight Bearing: Weight bearing as tolerated  Vision/Perception  Pt wears glasses for reading only at baseline. No changes in baseline vision. No apparent visual deficits. Perception Perception: Within Functional Limits Praxis Praxis: Intact   Cognition     Sensation     Motor  Motor Motor: Abnormal postural alignment and control Motor - Discharge Observations: generalized weakness & deconditioning   Mobility Bed Mobility Bed Mobility: Supine to Sit Supine to Sit: Supervision/Verbal cueing(HOB elevated, bed rails, leg lifter for LLE) Transfers Transfers: Sit to Stand;Stand to Sit Sit to Stand: Minimal Assistance - Patient > 75%(heavy reliance on RW with 1UE, pushes to stand with other UE, posterior lean/LOB) Stand to Sit: Contact Guard/Touching assist;Minimal Assistance - Patient > 75% Stand Pivot Transfers: Contact Guard/Touching assist(RW)   Locomotion  Gait Ambulation: Yes Gait Assistance: Contact Guard/Touching assist Gait Distance (Feet): 35 Feet Assistive device: Rolling walker Gait Gait: Yes Gait Pattern: Impaired Gait Pattern: (step to leading LLE, decreased weight shifting L, decreased dorsiflexion & heel strike LLE, decreased step BLE (L shorter than R) & stride length, heavy reliance on BUE on RW) Gait velocity: decreased Wheelchair Mobility Wheelchair Mobility: Yes Wheelchair Assistance: Chartered loss adjuster: Both upper extremities Wheelchair Parts Management: Needs assistance Distance: 80 ft   Trunk/Postural Assessment  Postural Control Postural Control: Deficits on evaluation Righting Reactions: delayed  Protective Responses: delayed   Balance Dynamic Standing Balance Dynamic Standing - Balance Support: Bilateral upper extremity supported Dynamic Standing - Level of Assistance: (CGA) Dynamic Standing - Balance Activities: (during gait with RW)   Extremity Assessment  RUE Assessment RUE Assessment: Within  Functional Limits LUE Assessment LUE Assessment: Within Functional Limits RLE Assessment RLE  Assessment: Within [place], PT, DPT 10/28/2018, 12:54 PM

## 2018-10-28 NOTE — Significant Event (Signed)
Patient called for assistance to the bathroom. NT assisted her and requested to sit on the wheelchair after. This RN (Careers information officer) came and placed the alarm belt, bedside table positioned in front of her, call bell within reach, and door ajar. When this RN returned to check patient, door was closed and when this RN opened the door, there was a strong smell of smoke (like cigarette). RN asked what was that smell and pt stated she does not know. Pt denied she smoked or sprayed anything. This RN also noticed the alarm belt was disconnected from one end. Patient requested to get back in bed. While this RN was fixing her bed, I noticed a small round pill on the bed. When asked, patient stated it was probably medicine that she did not take. After transferring pt to bed, This RN reiterated and informed patient not to take any medicine from home and not to disconnect the alarm belt on her own. While in the room, this RN scanned the room, checked her table and found another round pill. I put those pills in a plastic and brought outside. In the hallway, this RN seek the assistance of another staff nurse to look on the pills under good lighting. There was no marking found to identify those tablets.  After the incident, the undersigned informed the Dauda C., Agricultural consultant. While we were talking, patient called that she spilled water. This RN went to the room and changed her gown and bed pad. While transferring patient's soiled gown to the linen bag, I found a box of cigarette (with 7 pieces of cigarette and a lighter inside) in the gown pocket. This RN showed it to the patient and patient admitted that she smoked "a couple". This RN talked to her the danger of her behavior and our hospital policy.   Mr. Rockne Menghini, Charge RN was again updated. He went to patient's room and  talked to the patient with this RN at the bedside. Charge RN addressed the alarm belt, smoking, and medication issues. Mr. Rockne Menghini informed patient that this  incident will be brought to the attention of management.  During the conversation, patient also admitted that she took sleep medicine that her husband brought from home. In response to this conversation, patient stated that she wanted to go home. Staff left the room with door open.

## 2018-10-28 NOTE — Discharge Summary (Signed)
Discharge summary job # [id number]

## 2018-10-28 NOTE — Progress Notes (Signed)
Occupational Therapy Session Note  Patient Details  Name: [name] MRN: [id number] Date of Birth: 04/15/1946  Today's Date: 10/28/2018 OT Individual Time: 1100-1200 OT Individual Time Calculation (min): 60 min    Short Term Goals: Week 1:  OT Short Term Goal 1 (Week 1): Pt will complete 3/3 toileting tasks with CGA OT Short Term Goal 2 (Week 1): Pt will stand to complete 2 grooming tasks with CGA in order to increase functional standing balance and endurance OT Short Term Goal 3 (Week 1): Pt will complete LB dressing with CGA using AE PRN  Skilled Therapeutic Interventions/Progress Updates:    Pt seen for OT ADL bathing/dressing session. Pt sitting up in w/c upon arrival, agreeable to tx session. Voiced pain as "much better, and manageable" agreeable to tx session without intervention.  She completed bathing/dressing routine from w/c level at sink. Supervision/set-up for UB bathing. She was able to doff B socks with increased time, and able to wash both feet. Required min A to don sock on R. CGA for standing to complete pericare/buttock hygiene. Pt without personal clothing and opting to wear hospital gown vs. Paper scrubs.  She self propelled w/c to ADL apartment with supervision/VCs. She completed short distance ambulation with RW and CGA, ambulating to couch. She completed sit<>Stand from low soft surface couch with supervision, VCs for hand placement on RW.  Discussed at length recommendation for [facility] and use of 3-1 at bedside for nighttime toileting and over the toilet during the day. Following education, pt agreeable to recommendation.  Pt returned to room at end of session, left seated in w/c with chair belt alarm on, all needs in reach and set-up with lunch tray.   Therapy Documentation Precautions:  Precautions Precautions: Fall Restrictions Weight Bearing Restrictions: No LLE Weight Bearing: Weight bearing as tolerated    Therapy/Group: Individual Therapy  Caedan Sumler  L 10/28/2018, 7:07 AM

## 2018-10-28 NOTE — Progress Notes (Signed)
Physical Therapy Session Note  Patient Details  Name: [name] MRN: [id number] Date of Birth: [birth year]  Today's Date: 10/28/2018 PT Individual Time: 1300-1325 PT Individual Time Calculation (min): 25 min   Short Term Goals: Week 1:  PT Short Term Goal 1 (Week 1): STG=LTG due to ELOS  Skilled Therapeutic Interventions/Progress Updates:    Pt received seated in w/c in room. Pt complaining of pain in LLE, 8/10 and reports she just received pain medication from RN recently. Pt has pulled off her wound dressing and is complaining of redness and swelling near surgical site. PA notified who will notify vascular team to come check on pt. RN notified that pt has removed dressing and is able to come redress site, also notified RN of redness in posterior proximal LLE. Education with patient about importance of continuing ROM of LLE and to practice mobility, pt unwilling to participate in session due to pain in LLE. Pt left seated in w/c in room with needs in reach. Pt missed 50 min of scheduled therapy session due to pain in LLE and concerns over swelling and redness in extremity.  Therapy Documentation Precautions:  Precautions Precautions: Fall Restrictions Weight Bearing Restrictions: No LLE Weight Bearing: Weight bearing as tolerated General: PT Amount of Missed Time (min): 50 Minutes PT Missed Treatment Reason: Patient unwilling to participate;Pain;Other (Comment)(possible medical issue with LLE)     Therapy/Group: Individual Therapy  Excell Seltzer, PT, DPT  10/28/2018, 3:23 PM

## 2018-10-28 NOTE — Progress Notes (Signed)
Occupational Therapy Discharge Summary  Patient Details  Name: [name] MRN: [id number] Date of Birth: Sep 09, 1946   Patient has met 4 of 10 long term goals due to improved activity tolerance, improved balance, postural control, ability to compensate for deficits and improved coordination.  Patient to discharge at [facility] Assist level.  Patient's care partner is independent to provide the necessary physical assistance at discharge.  Pt and family wishing to d/c home before completion of program and therefore pt not meeting all OT goals. Formal family education not completed with pt's caregivers due to earlier than expected d/c, however, they report to CSW they will be willing and able to provide her needed level of assist at d/c.  Pt with limited time and participation while on IPR. Pt completes very short distance ambulation with RW and CGA. She did not have clothing during rehab stay, only wearing hospital gowns and declining attempts and education for ADL re-training.  Shower goal not addressed at this time as pt not medically appropriate for showering task, made pt aware of HHOT to assess with this at d/c. She completes bathing routine from w/c level at sink with min A.  Reasons goals not met: Pt requires min physical assist overall for basic ADLs and transfers. Mod-max A required for LB dressing 2/2 limited ROM in L LE and pain affecting mobility.   Recommendation:  Patient will benefit from ongoing skilled OT services in home health setting to continue to advance functional skills in the area of BADL and Reduce care partner burden.  Equipment: Drop arm BSC  Reasons for discharge: discharge from hospital; per pt and family request  Patient/family agrees with progress made and goals achieved: Yes  OT Discharge Precautions/Restrictions  Precautions Precautions: Fall Restrictions Weight Bearing Restrictions: No LLE Weight Bearing: Weight bearing as tolerated Vision Baseline  Vision/History: Wears glasses Wears Glasses: Reading only Patient Visual Report: No change from baseline Vision Assessment?: No apparent visual deficits Perception  Perception: Within Functional Limits Praxis Praxis: Intact Cognition Overall Cognitive Status: Impaired/Different from baseline Arousal/Alertness: Awake/alert Orientation Level: Oriented X4 Memory: Impaired Memory Impairment: Decreased short term memory Decreased Short Term Memory: Verbal complex;Functional complex Awareness: Impaired Awareness Impairment: Anticipatory impairment Problem Solving: Impaired Problem Solving Impairment: Verbal complex;Functional complex Behaviors: Impulsive Safety/Judgment: Impaired Comments: Decreased safety awareness and awareness of deficits Sensation Sensation Light Touch: Appears Intact Coordination Gross Motor Movements are Fluid and Coordinated: No Fine Motor Movements are Fluid and Coordinated: Yes Coordination and Movement Description: Imapired due to L LE ROM,WBing, and pain limitations Motor  Motor Motor: Abnormal postural alignment and control Motor - Discharge Observations: generalized weakness & deconditioning Mobility  Bed Mobility Bed Mobility: Supine to Sit Supine to Sit: Supervision/Verbal cueing(HOB elevated, bed rails, leg lifter for LLE) Transfers Sit to Stand: Minimal Assistance - Patient > 75%(heavy reliance on RW with 1UE, pushes to stand with other UE, posterior lean/LOB) Stand to Sit: Contact Guard/Touching assist;Minimal Assistance - Patient > 75%  Trunk/Postural Assessment  Cervical Assessment Cervical Assessment: Within Functional Limits Thoracic Assessment Thoracic Assessment: Exceptions to WFL(Kyphotic) Lumbar Assessment Lumbar Assessment: Exceptions to WFL(Posterior pelvic tilt) Postural Control Postural Control: Deficits on evaluation Righting Reactions: delayed  Protective Responses: delayed  Balance Balance Balance Assessed: Yes Static  Sitting Balance Static Sitting - Balance Support: Feet supported;No upper extremity supported Static Sitting - Level of Assistance: 6: Modified independent (Device/Increase time) Static Sitting - Comment/# of Minutes: Sitting EOB Dynamic Sitting Balance Dynamic Sitting - Balance Support: During functional activity;Feet supported;No  upper extremity supported Dynamic Sitting - Level of Assistance: 5: Stand by assistance Sitting balance - Comments: sitting in w/c to complete bathing/dressing task Static Standing Balance Static Standing - Balance Support: During functional activity;Right upper extremity supported;Left upper extremity supported Static Standing - Level of Assistance: 4: Min assist;5: Stand by assistance Dynamic Standing Balance Dynamic Standing - Balance Support: During functional activity;Right upper extremity supported;Left upper extremity supported Dynamic Standing - Level of Assistance: 5: Stand by assistance;4: Min assist Dynamic Standing - Balance Activities: (during gait with RW) Dynamic Standing - Comments: During LB clothing management/toileting tasks Extremity/Trunk Assessment RUE Assessment RUE Assessment: Within Functional Limits LUE Assessment LUE Assessment: Within Functional Limits   Nannie Starzyk L 10/28/2018, 3:28 PM

## 2018-10-28 NOTE — Progress Notes (Signed)
The primary nurse informed this CN that patient was smoking in her room and that she found a box of cigaret in her room. This CN went into the patient's room and talk to her about smoking in her room and patient confirmed that she was smoking in her bathroom. And while she was explaining, she also confirmed that her husband has been bringing sleeping meds for her. This CN educated patient about the hospital smoking policy and the home meds policy. Patient verbally expressed understanding of the two policies. We continue to monitor.

## 2018-10-28 NOTE — Progress Notes (Signed)
Physical Therapy Session Note  Patient Details  Name: TAILYN HANTZ MRN: [id number] Date of Birth: [birth year]  Today's Date: 10/28/2018 PT Individual Time: 0903-1000 PT Individual Time Calculation (min): 57 min   Short Term Goals: Week 1:  PT Short Term Goal 1 (Week 1): STG=LTG due to ELOS  Skilled Therapeutic Interventions/Progress Updates:  Pt received in bed reporting 3/10 posterior LLE pain at rest & 10/10 with dependent position - RN made aware & administered meds at beginning of session. Pt transferred to sitting EOB with hospital bed features and supervision with therapist providing pt with leg lifter & instructing her on how to use AD & pt with good return demo. Pt requires extra time to allow LLE into dependent position sitting EOB. Pt transfers sit>stand from EOB & w/c during session with cuing for hand placement to push up from stable sitting surface with at least 1 extremity but pt continues to rely heavily on RW with other extremity & therapist educated her on reduced safety when relying heavily on RW. Pt unstable with some posterior lean during sit>stand requiring min assist. Pt transfers bed>w/c on R with RW & CGA with cuing to utilize AD with pt electing to weight bear very little through LLE. Pt propels w/c room>gym with BUE & supervision with 2 rest breaks & max distance of ~80 ft. Pt ambulates 35 ft with RW & CGA with step-to pattern with reliance on BUE on RW to offseat weight bearing on LLE. LCSW [facility]) arrived during session & pt reports she plans to rent a hospital bed to use on the first floor where she has access to a full bathroom. Pt reports she has a RW with therapist recommending leg lifter if pt finds it beneficial, as well as 3-in-1 BSC, 20x18 w/c, and HHPT. Pt reports her husband has alzheimers but is doing well physically & her nephew (from [place]) is planning to stay with them for 3 weeks. Discussed need for pt to practice stair negotiation with RW as pt has 2 steps  without rails to enter her home. Provided pt with handout regarding bumping up steps in w/c in case she is unable to practice later today or unable to attempt at home 2/2 pain. At end of session pt left sitting in w/c in room with chair alarm donned & needs at a hand.   Therapy Documentation Precautions:  Precautions Precautions: Fall Restrictions Weight Bearing Restrictions: No LLE Weight Bearing: Weight bearing as tolerated    Therapy/Group: Individual Therapy  Waunita Schooner 10/28/2018, 10:27 AM

## 2018-10-28 NOTE — Plan of Care (Signed)
  Problem: Consults Goal: RH STROKE PATIENT EDUCATION Description See Patient Education module for education specifics  Outcome: Progressing   Problem: RH SKIN INTEGRITY Goal: RH STG SKIN FREE OF INFECTION/BREAKDOWN Description Pt will be able to prevent breakdown by moving in bed with min assist while in rehab.   Outcome: Progressing Goal: RH STG ABLE TO PERFORM INCISION/WOUND CARE W/ASSISTANCE Description STG Able To Perform Incision/Wound Care With [employer].  Outcome: Progressing   Problem: RH PAIN MANAGEMENT Goal: RH STG PAIN MANAGED AT OR BELOW PT'S PAIN GOAL Description < 3 out of 10.   Outcome: Progressing

## 2018-10-28 NOTE — Discharge Summary (Addendum)
NAME: [name], [name] MEDICAL RECORD PT:[id number] ACCOUNT [id number] DATE OF BIRTH:[birth year] FACILITY: [facility] LOCATION: [facility] PHYSICIAN:ANKIT PATEL, MD  DISCHARGE SUMMARY  DATE OF DISCHARGE:  10/29/2018  DISCHARGE DIAGNOSES: 1.  Debility secondary to prior cerebrovascular accident as well as peripheral vascular disease, status post left lower extremity bypass grafting 10/22/2018. 2.  Pain management. 3.  Subcutaneous Lovenox for deep venous thrombosis prophylaxis. 4.  Mood with bipolar disorder. 5.  History of cerebrovascular accident. 6.  Hypertension. 7.  Tobacco abuse. 8.  Hyperlipidemia. 9.  Constipation. 10.  Acute blood loss anemia.  HISTORY OF PRESENT ILLNESS:  This is a 73 year old right-handed female with history of bipolar disorder, AAA, CVA, CAD, hypertension, tobacco abuse as well as peripheral vascular disease who lives with spouse, reportedly independent prior to admission.   She does have assistance of a nephew.  Husband with history of dementia and limited physical assistance.  Presented 10/22/2018, followed by vascular surgery for a long history of peripheral vascular disease.  Noted claudication left lower extremity as  well as ulcer at pretibial region times several months.  Due to progressive rest pain to the left foot, the patient underwent left superficial femoral artery to below-knee popliteal artery bypass 10/22/2018 per Dr. Oneida Alar.  HOSPITAL COURSE:  Pain management.  Acute blood loss anemia 10.9.  Subcutaneous Lovenox for DVT prophylaxis.  Therapies initiated.  The patient was admitted for a comprehensive rehab program.  PAST MEDICAL HISTORY:  See discharge diagnoses.  SOCIAL HISTORY:  Lives with spouse, reportedly independent prior to admission.  She has a son in [place].  FUNCTIONAL STATUS:  Upon admission to rehab services, minimal assist 35 feet rolling walker, moderate assist sit to stand, min mod assist with ADLs.  PHYSICAL EXAMINATION: VITAL  SIGNS:  Blood pressure 141/58, pulse 78, temperature 97, respirations 18. GENERAL:  Alert female, sitting up in bed, followed full commands. HEENT:  EOMs intact. NECK:  Supple, nontender, no JVD. CARDIOVASCULAR:  Rate controlled. ABDOMEN:  Soft, nontender, good bowel sounds. LUNGS:  Clear to auscultation without wheeze.  Bypass site dressing in place with some redness.  EXTREMITIES:  Tip of the left toe slightly blue.  REHABILITATION HOSPITAL COURSE:  The patient was admitted to inpatient rehab services.  Therapies initiated on a 3-hour daily basis, consisting of physical therapy, occupational therapy and rehab nursing.  The following issues were addressed:  Pertaining  to the patient's peripheral vascular disease, left lower extremity bypass grafting 10/22/2018, she would follow up with vascular surgery.patient did have some redness at the surgical site vascular surgery contacted patient was placed on Keflex.  She remained on subcutaneous Lovenox for DVT prophylaxis.  No bleeding episodes.  Pain management, use of  oxycodone.  She did have a history of bipolar disorder, mood stabilization with Lamictal, Klonopin as well as Cymbalta.  History of cerebrovascular accident.  She remained on low-dose aspirin.  Blood pressure is overall controlled.  Noted history of  abdominal aortic aneurysm, followed as an outpatient.  Acute blood loss anemia 9.7 and stable.  Ongoing bouts of tobacco abuse.  She was found on occasion smoking in the bathroom of her hospital room.  It was discussed at length smoking policy as well as  the need for cessation of nicotine products.  The patient was quite adamant of smoking as well as issues in regard to her family in long discussion and her overall medical care.  The patient received weekly collaborative interdisciplinary team  conferences with therapies.  She was ambulating 30 feet,  minimal guard, rolling walker up and down stairs using bilateral upper extremity support.   Ongoing family teaching with nephew.  Simulated car transfers with mod/max assist.  Contact guard assist  for toileting tasks.  The patient remained quite adamant about request for discharge to home.  Full family teaching was completed.  She received followup by neuropsychology in regard to her overall hospital stay.  She was discharged to home.  DISCHARGE MEDICATIONS:  Included Norvasc 10 mg p.o. daily, aspirin 81 mg p.o. daily, Lipitor 40 mg p.o. daily, Klonopin 0.5 mg p.o. at bedtime, Cymbalta 30 mg q.p.m. and 60 mg daily, Lamictal 150 mg p.o. BID, Linzess 145 mcg p.o. at bedtime, Protonix  40 mg p.o. daily,Cozaar 50 mg daily MiraLax daily, Requip 2 mg p.o. at bedtime,Keflex 500 mg every 6 hours 14 days, oxycodone 1 to 2 tablets every 4 hours as needed for pain.  DIET:  Regular.  FOLLOWUP:  She would follow up with Dr. Oneida Alar, vascular surgery, call for appointment; Dr. Quay Burow as needed; Dr. Merri Ray, medical management.  SPECIAL INSTRUCTIONS:  Keep wound clean and dry.  No smoking.  LN/NUANCE D:10/28/2018 T:10/28/2018 JOB:[id number] Patient seen and examined by me on day of discharge. Delice Lesch, MD, ABPMR

## 2018-10-28 NOTE — Progress Notes (Signed)
[place] PHYSICAL MEDICINE & REHABILITATION PROGRESS NOTE  Subjective/Complaints: Patient seen sitting up in bed this morning.  She states she slept well overnight.  She notes she has leg pain.  She states she wants to go home tomorrow.  Family with issues yesterday and taking medications from home with agitation and profanity with?  Smoking in the restroom.  Please see nursing notes.  ROS: Denies CP, SOB, N/V/D  Objective: Vital Signs: Blood pressure (!) 153/76, pulse 80, temperature 97.7 F (36.5 C), temperature source Oral, resp. rate 18, height 5\' 8"  (1.727 m), weight 81.8 kg, SpO2 98 %. No results found. No results for input(s): WBC, HGB, HCT, PLT in the last 72 hours. No results for input(s): NA, K, CL, CO2, GLUCOSE, BUN, CREATININE, CALCIUM in the last 72 hours.  Physical Exam: BP (!) 153/76 (BP Location: Right Arm)   Pulse 80   Temp 97.7 F (36.5 C) (Oral)   Resp 18   Ht 5\' 8"  (1.727 m)   Wt 81.8 kg   SpO2 98%   BMI 27.42 kg/m  Constitutional: No distress . Vital signs reviewed. HENT: Normocephalic.  Atraumatic. Eyes: EOMI. No discharge. Cardiovascular: RRR. No JVD. Respiratory: CTA Bilaterally. Normal effort. GI: BS +. Non-distended. Musc:  Musc: + Tenderness left lower extremity Neuro: Alert. Motor: 5/5 in bilateral deltoid, bicep, tricep, grip 5/5  RLE: 5/5 proximal distal LLE: Hip flexion 2+/5, knee extension 3/5 clinical dorsiflexion 5/5 (pain inhibition) Skinleft femoropopliteal surgical site with serosanguineous drainage.   Psych: Anxious  Assessment/Plan: 1. Functional deficits secondary to debility which require 3+ hours per day of interdisciplinary therapy in a comprehensive inpatient rehab setting.  Physiatrist is providing close team supervision and 24 hour management of active medical problems listed below.  Physiatrist and rehab team continue to assess barriers to discharge/monitor patient progress toward functional and medical goals  Care  Tool:  Bathing    Body parts bathed by patient: Right arm, Left arm, Chest, Abdomen, Front perineal area, Buttocks, Right upper leg, Left lower leg, Face, Left upper leg   Body parts bathed by helper: Buttocks     Bathing assist Assist Level: Contact Guard/Touching assist     Upper Body Dressing/Undressing Upper body dressing   What is the patient wearing?: Hospital gown only    Upper body assist Assist Level: Supervision/Verbal cueing    Lower Body Dressing/Undressing Lower body dressing      What is the patient wearing?: Hospital gown only     Lower body assist       Toileting Toileting    Toileting assist Assist for toileting: Minimal Assistance - Patient > 75%     Transfers Chair/bed transfer  Transfers assist     Chair/bed transfer assist level: Minimal Assistance - Patient > 75%     Locomotion Ambulation   Ambulation assist   Ambulation activity did not occur: Safety/medical concerns(unable to walk without walker)  Assist level: Contact Guard/Touching assist Assistive device: Walker-rolling Max distance: 30   Walk 10 feet activity   Assist  Walk 10 feet activity did not occur: Safety/medical concerns  Assist level: Contact Guard/Touching assist Assistive device: Walker-rolling   Walk 50 feet activity   Assist Walk 50 feet with 2 turns activity did not occur: Safety/medical concerns         Walk 150 feet activity   Assist Walk 150 feet activity did not occur: Safety/medical concerns         Walk 10 feet on uneven surface  activity  Assist Walk 10 feet on uneven surfaces activity did not occur: Safety/medical concerns         Wheelchair     Assist   Type of Wheelchair: Manual    Wheelchair assist level: Supervision/Verbal cueing Max wheelchair distance: 50    Wheelchair 50 feet with 2 turns activity    Assist        Assist Level: Supervision/Verbal cueing   Wheelchair 150 feet activity      Assist Wheelchair 150 feet activity did not occur: Safety/medical concerns   Assist Level: Supervision/Verbal cueing      Medical Problem List and Plan: 1.Debilitysecondary to deficits from prior CVA as well as peripheral vascular disease status post left lower extremity BPG 10/22/2018 with ongoing sensory loss, pain and weakness left lower extremity  Continue CIR, plan for DC tomorrow pending therapies  Weekend notes reviewed-see above, labs reviewed 2. DVT Prophylaxis/Anticoagulation: Subcutaneous Lovenox 3. Pain Management:Oxycodone as needed  Manageable on 2/17 4. Mood/bipolar disorder. Lamictal 150 mg daily,Klonopin 0.5 mg daily at bedtime, Cymbalta 60 mg daily and 30 mg every evening 5. Neuropsych: This patientiscapable of making decisions on herown behalf. 6. Skin/Wound Care:Serosanguineous drainage from medial and portion of incision.   7. Fluids/Electrolytes/Nutrition:Routine and ins and outs  BMP within acceptable range on 2/14 8.History of CVA. Resume aspirin 9. Hypertension. Patient on Norvasc 10 mg daily prior to admission.    Resumed norvasc 5mg  on 2/15, increased to 10 mg per her home regimen.  Labile on 2/17 10. History of AAA. Follow-up outpatient 11. Tobacco abuse. Provide counseling 12. Hyperlipidemia. Lipitor 13. Constipation. Laxative assistance 14.  Hypoalbuminemia  Supplement initiated on 2/14 15.  Acute blood loss anemia  Hemoglobin 9.7 on 2/14  Continue to monitor 16.  Sleep disturbance: Added trazodone 50 mg nightly as needed  Improving  LOS: 4 days A FACE TO FACE EVALUATION WAS PERFORMED  Ethyle Tiedt Lorie Phenix 10/28/2018, 9:21 AM

## 2018-10-28 NOTE — Telephone Encounter (Signed)
This encounter was created in error - please disregard.

## 2018-10-29 ENCOUNTER — Inpatient Hospital Stay (HOSPITAL_COMMUNITY): Payer: Medicare Other | Admitting: Occupational Therapy

## 2018-10-29 LAB — CBC
HCT: 31.5 % — ABNORMAL LOW (ref 36.0–46.0)
Hemoglobin: 10 g/dL — ABNORMAL LOW (ref 12.0–15.0)
MCH: 29 pg (ref 26.0–34.0)
MCHC: 31.7 g/dL (ref 30.0–36.0)
MCV: 91.3 fL (ref 80.0–100.0)
NRBC: 0 % (ref 0.0–0.2)
Platelets: 266 10*3/uL (ref 150–400)
RBC: 3.45 MIL/uL — ABNORMAL LOW (ref 3.87–5.11)
RDW: 14 % (ref 11.5–15.5)
WBC: 5.7 10*3/uL (ref 4.0–10.5)

## 2018-10-29 NOTE — Discharge Instructions (Signed)
Inpatient Rehab Discharge Instructions  [name] Discharge date and time: No discharge date for patient encounter.   Activities/Precautions/ Functional Status: Activity: activity as tolerated Diet: regular diet Wound Care: keep wound clean and dry Functional status:  ___ No restrictions     ___ Walk up steps independently ___ 24/7 supervision/assistance   ___ Walk up steps with assistance ___ Intermittent supervision/assistance  ___ Bathe/dress independently ___ Walk with walker     _x__ Bathe/dress with assistance ___ Walk Independently    ___ Shower independently ___ Walk with assistance    ___ Shower with assistance ___ No alcohol     ___ Return to work/school ________  Special Instructions: No driving or smoking   COMMUNITY REFERRALS UPON DISCHARGE:    Home Health:   PT, OT, RN    [place] Phone: [phone]   Date of last service:10/29/2018  Medical Equipment/Items Ordered:HOPITAL BED, DROP-ARM [facility]   [phone]    GENERAL COMMUNITY RESOURCES FOR PATIENT/FAMILY: Support Groups:[facility] SUPPORT GROUP LAST Tuesday OF [facility] MONTH AT [place] [phone]  My questions have been answered and I understand these instructions. I will adhere to these goals and the provided educational materials after my discharge from the hospital.  Patient/Caregiver Signature _______________________________ Date __________  Clinician Signature _______________________________________ Date __________  Please bring this form and your medication list with you to all your follow-up doctor's appointments.

## 2018-10-29 NOTE — Progress Notes (Signed)
Please see discharge summary as well.  Patient stating that she is not going home today because her leg hurts.  Vascular evaluated patient, discussed with PA as well yesterday and discussed plan for outpatient follow-up.  Informed patient that she should take as needed pain medication and follow-up with vascular however she is stable for discharge today. >30 minutes spent in total with patient between myself and PA, planning for discharge regarding pain, tobacco abuse, date of discharge, Vascular recs, etc.

## 2018-10-29 NOTE — Progress Notes (Signed)
Social Work  Discharge Note  The overall goal for the admission was met for:   Discharge location: Yes-HOME WITH HUSBAND AND NEPHEW  Length of Stay: Yes-5 DAYS  Discharge activity level: Yes-SUPERVISION LEVEL  Home/community participation: Yes  Services provided included: MD, RD, PT, OT, RN, CM, Pharmacy, Neuropsych and SW  Financial Services: Medicare and Private Insurance: [place]  Follow-up services arranged: Home Health: [facility]-PT,OT,RN, DME: [facility] BED, WHEELCHAIR AND DROP-ARM BEDSIDE [facility] and Patient/Family request agency HH: PREF AGENCY, DME: NO PREF  Comments (or additional information):PT REQUESTED TO GO HOME EARLY AND IS AT SUPERVISION LEVEL. NEPHEW IS HOME WITH BOTH PT AND HUSBAND TO PROVIDE CARE TO BOTH. HUSBAND HAS DEMENTIA. PT REFUSED TO HAVE ANY EQUIPMENT DELIVERED TO [facility]. ALL (PT, HUSBAND AND NEPHEW)FEEL SHE CAN GET INTO THE HOME WITH NEPHEWS HELP. EQUIPMENT TOP BE DELIVERED TO THE HOME BETWEEN 1:00-5:00 PM TODAY. READY FOR DC  Patient/Family verbalized understanding of follow-up arrangements: Yes  Individual responsible for coordination of the follow-up plan: SELF & [name]-NEPHEW  Confirmed correct DME delivered: Elease Hashimoto 10/29/2018    Elease Hashimoto

## 2018-10-29 NOTE — Progress Notes (Signed)
Anticipates discharge home today, patient states she can rest better and recover better at home, Pain score at 8/10 on pain scale, monitor and assisted

## 2018-10-30 ENCOUNTER — Telehealth: Payer: Self-pay | Admitting: Family Medicine

## 2018-10-30 ENCOUNTER — Telehealth: Payer: Self-pay | Admitting: *Deleted

## 2018-10-30 NOTE — Telephone Encounter (Signed)
Copied from [place] [id number] [phone]. Topic: General - Inquiry >> Oct 30, 2018  9:20 AM Vernona Rieger wrote: Reason for CRM: Patient's nephew, Shanon Brow called and said she just had surgery " vascular bypass on her leg " & has an appt with Dr Carlota Raspberry on 2/26. He called to let Dr Carlota Raspberry know that she tried to go to the bathroom last night and took a fall. She did not hit her head. He said he is a Therapist, sports and that he looked over her and everything looks fine, incisions look fine. He said she is sleeping right now and he is going to take her blood pressure when she wakes up. If you need to speak to him you can reach him at [phone] (his cell) or her husband's phone @ [phone]

## 2018-10-30 NOTE — Telephone Encounter (Signed)
Please advise 

## 2018-10-30 NOTE — Telephone Encounter (Signed)
Please call her and check status since fall. If any concern of injury would recommend ER eval.   As far as home health, I will look to see if paperwork was sent to me, but please see who placed that order as may have been ordered or discussed by inpatient rehab team.

## 2018-10-30 NOTE — Telephone Encounter (Signed)
FYI

## 2018-10-30 NOTE — Telephone Encounter (Signed)
Spoke to patients nephew he stated patient is doing well post fall they did not see any injury from the fall and she seems to be doing good and not having any issus at this time. He did state that [place] will be coming out to the  home 10/31/18 @12 :00.

## 2018-10-30 NOTE — Telephone Encounter (Signed)
Dorian Pod Nurse from [facility] left message stating she has tried to contact patient several times and has driven to patients address and has been unable to contact patient to set up [facility]. I called and left message on patient voice mail instructing her to call [facility] to set up her [place].

## 2018-10-30 NOTE — Telephone Encounter (Signed)
Great.  Thanks

## 2018-10-30 NOTE — Telephone Encounter (Signed)
Copied from [place] [id number]. Topic: General - Other >> Oct 30, 2018  1:52 PM Antonieta Iba C wrote: Reason for CRM: pt's nephew Shanon Brow called in to be advised. He said while in the hospital pt was advised about having a some home health services. Pt is inquiring about when will it start? Is this something that they need to set up themselves?   Pt says if someone could advise her on what she should do in regards to services she would appreciate it

## 2018-10-31 DIAGNOSIS — I10 Essential (primary) hypertension: Secondary | ICD-10-CM | POA: Diagnosis not present

## 2018-10-31 DIAGNOSIS — R531 Weakness: Secondary | ICD-10-CM | POA: Diagnosis not present

## 2018-10-31 DIAGNOSIS — I700 Atherosclerosis of aorta: Secondary | ICD-10-CM | POA: Diagnosis not present

## 2018-10-31 DIAGNOSIS — I69328 Other speech and language deficits following cerebral infarction: Secondary | ICD-10-CM | POA: Diagnosis not present

## 2018-10-31 DIAGNOSIS — M47816 Spondylosis without myelopathy or radiculopathy, lumbar region: Secondary | ICD-10-CM | POA: Diagnosis not present

## 2018-10-31 DIAGNOSIS — M5186 Other intervertebral disc disorders, lumbar region: Secondary | ICD-10-CM | POA: Diagnosis not present

## 2018-10-31 DIAGNOSIS — I69398 Other sequelae of cerebral infarction: Secondary | ICD-10-CM | POA: Diagnosis not present

## 2018-10-31 DIAGNOSIS — D62 Acute posthemorrhagic anemia: Secondary | ICD-10-CM | POA: Diagnosis not present

## 2018-10-31 DIAGNOSIS — I739 Peripheral vascular disease, unspecified: Secondary | ICD-10-CM | POA: Diagnosis not present

## 2018-10-31 DIAGNOSIS — E039 Hypothyroidism, unspecified: Secondary | ICD-10-CM | POA: Diagnosis not present

## 2018-10-31 DIAGNOSIS — M4186 Other forms of scoliosis, lumbar region: Secondary | ICD-10-CM | POA: Diagnosis not present

## 2018-10-31 DIAGNOSIS — Z95828 Presence of other vascular implants and grafts: Secondary | ICD-10-CM | POA: Diagnosis not present

## 2018-10-31 DIAGNOSIS — Z9489 Other transplanted organ and tissue status: Secondary | ICD-10-CM | POA: Diagnosis not present

## 2018-10-31 DIAGNOSIS — E785 Hyperlipidemia, unspecified: Secondary | ICD-10-CM | POA: Diagnosis not present

## 2018-10-31 DIAGNOSIS — K5900 Constipation, unspecified: Secondary | ICD-10-CM | POA: Diagnosis not present

## 2018-10-31 DIAGNOSIS — I2510 Atherosclerotic heart disease of native coronary artery without angina pectoris: Secondary | ICD-10-CM | POA: Diagnosis not present

## 2018-10-31 DIAGNOSIS — Z48812 Encounter for surgical aftercare following surgery on the circulatory system: Secondary | ICD-10-CM | POA: Diagnosis not present

## 2018-10-31 DIAGNOSIS — F17210 Nicotine dependence, cigarettes, uncomplicated: Secondary | ICD-10-CM | POA: Diagnosis not present

## 2018-10-31 DIAGNOSIS — F319 Bipolar disorder, unspecified: Secondary | ICD-10-CM | POA: Diagnosis not present

## 2018-10-31 DIAGNOSIS — Z9181 History of falling: Secondary | ICD-10-CM | POA: Diagnosis not present

## 2018-10-31 DIAGNOSIS — Z7982 Long term (current) use of aspirin: Secondary | ICD-10-CM | POA: Diagnosis not present

## 2018-10-31 DIAGNOSIS — I714 Abdominal aortic aneurysm, without rupture: Secondary | ICD-10-CM | POA: Diagnosis not present

## 2018-10-31 DIAGNOSIS — K219 Gastro-esophageal reflux disease without esophagitis: Secondary | ICD-10-CM | POA: Diagnosis not present

## 2018-11-01 DIAGNOSIS — I739 Peripheral vascular disease, unspecified: Secondary | ICD-10-CM | POA: Diagnosis not present

## 2018-11-01 DIAGNOSIS — D62 Acute posthemorrhagic anemia: Secondary | ICD-10-CM | POA: Diagnosis not present

## 2018-11-01 DIAGNOSIS — Z48812 Encounter for surgical aftercare following surgery on the circulatory system: Secondary | ICD-10-CM | POA: Diagnosis not present

## 2018-11-01 DIAGNOSIS — I700 Atherosclerosis of aorta: Secondary | ICD-10-CM | POA: Diagnosis not present

## 2018-11-01 DIAGNOSIS — I2510 Atherosclerotic heart disease of native coronary artery without angina pectoris: Secondary | ICD-10-CM | POA: Diagnosis not present

## 2018-11-01 DIAGNOSIS — I714 Abdominal aortic aneurysm, without rupture: Secondary | ICD-10-CM | POA: Diagnosis not present

## 2018-11-04 ENCOUNTER — Telehealth: Payer: Self-pay | Admitting: Family Medicine

## 2018-11-04 ENCOUNTER — Telehealth: Payer: Self-pay | Admitting: *Deleted

## 2018-11-04 DIAGNOSIS — I2510 Atherosclerotic heart disease of native coronary artery without angina pectoris: Secondary | ICD-10-CM | POA: Diagnosis not present

## 2018-11-04 DIAGNOSIS — D62 Acute posthemorrhagic anemia: Secondary | ICD-10-CM | POA: Diagnosis not present

## 2018-11-04 DIAGNOSIS — I700 Atherosclerosis of aorta: Secondary | ICD-10-CM | POA: Diagnosis not present

## 2018-11-04 DIAGNOSIS — I714 Abdominal aortic aneurysm, without rupture: Secondary | ICD-10-CM | POA: Diagnosis not present

## 2018-11-04 DIAGNOSIS — I739 Peripheral vascular disease, unspecified: Secondary | ICD-10-CM | POA: Diagnosis not present

## 2018-11-04 DIAGNOSIS — Z48812 Encounter for surgical aftercare following surgery on the circulatory system: Secondary | ICD-10-CM | POA: Diagnosis not present

## 2018-11-04 NOTE — Telephone Encounter (Signed)
Copied from [place] [id number]. Topic: General - Other >> Nov 04, 2018  8:50 AM Oneta Rack wrote: Caller name: Langley Gauss  Relation to pt: Visiting Nurse with [facility]  Call back number: [phone]    Reason for call:  Requesting verbal orders for start of care for nursing PT, OT and Home Health Aid, please advise

## 2018-11-04 NOTE — Telephone Encounter (Signed)
Call from patient's caregiver DAVID. Requesting refill on narcotic pain medication. Patient given 30 Percocet 7 days ago. Will not be able to refill without patient being seen. States that they have not tried any non-narcotic pain medication and patient has difficulty with pain control due to her "anxiety and fear of pain" Will move appointment with Dr. Oneida Alar to 11/07/2018 and instructed to try non-narcotic medications for pain/elevation of legs.

## 2018-11-05 ENCOUNTER — Telehealth: Payer: Self-pay | Admitting: Family Medicine

## 2018-11-05 DIAGNOSIS — D62 Acute posthemorrhagic anemia: Secondary | ICD-10-CM | POA: Diagnosis not present

## 2018-11-05 DIAGNOSIS — I739 Peripheral vascular disease, unspecified: Secondary | ICD-10-CM | POA: Diagnosis not present

## 2018-11-05 DIAGNOSIS — I2510 Atherosclerotic heart disease of native coronary artery without angina pectoris: Secondary | ICD-10-CM | POA: Diagnosis not present

## 2018-11-05 DIAGNOSIS — Z48812 Encounter for surgical aftercare following surgery on the circulatory system: Secondary | ICD-10-CM | POA: Diagnosis not present

## 2018-11-05 DIAGNOSIS — I700 Atherosclerosis of aorta: Secondary | ICD-10-CM | POA: Diagnosis not present

## 2018-11-05 DIAGNOSIS — I714 Abdominal aortic aneurysm, without rupture: Secondary | ICD-10-CM | POA: Diagnosis not present

## 2018-11-05 NOTE — Telephone Encounter (Signed)
Copied from [place] [id number]. Topic: Quick Communication - Home Health Verbal Orders >> Nov 05, 2018 12:48 PM Berneta Levins wrote: Caller/Agency: Timmothy Sours with [facility] Number: [phone], OK to leave a message Requesting OT/PT/Skilled Nursing/Social Work: OT - for ADL, transfers, IADL, and exercise, instructions and non-pharmalogical methods of pain control Frequency: 1 week 2, 0 week 1, 1 week 1

## 2018-11-05 NOTE — Telephone Encounter (Signed)
DR Duwaine Maxin I called Nurse to ok the verbal order   Relation to pt: Visiting Nurse with [facility]  Call back number: [phone]    Reason for call:  Langley Gauss was given verbal order to start of care for nursing PT, OT and Home Health Aid,

## 2018-11-05 NOTE — Telephone Encounter (Signed)
Agree. Thanks

## 2018-11-06 ENCOUNTER — Ambulatory Visit (INDEPENDENT_AMBULATORY_CARE_PROVIDER_SITE_OTHER): Payer: Medicare Other

## 2018-11-06 ENCOUNTER — Other Ambulatory Visit: Payer: Self-pay

## 2018-11-06 ENCOUNTER — Encounter: Payer: Self-pay | Admitting: Family Medicine

## 2018-11-06 ENCOUNTER — Inpatient Hospital Stay (HOSPITAL_COMMUNITY)
Admission: EM | Admit: 2018-11-06 | Discharge: 2018-11-09 | DRG: 863 | Disposition: A | Discharge status: Home Health | Payer: Medicare Other | Attending: Internal Medicine | Admitting: Internal Medicine

## 2018-11-06 ENCOUNTER — Ambulatory Visit (INDEPENDENT_AMBULATORY_CARE_PROVIDER_SITE_OTHER): Payer: Medicare Other | Admitting: Family Medicine

## 2018-11-06 ENCOUNTER — Encounter (HOSPITAL_COMMUNITY): Payer: Self-pay

## 2018-11-06 ENCOUNTER — Emergency Department (HOSPITAL_COMMUNITY): Payer: Medicare Other

## 2018-11-06 VITALS — BP 120/66 | HR 96 | Temp 97.3°F | Resp 17

## 2018-11-06 DIAGNOSIS — R6883 Chills (without fever): Secondary | ICD-10-CM | POA: Diagnosis not present

## 2018-11-06 DIAGNOSIS — J8410 Pulmonary fibrosis, unspecified: Secondary | ICD-10-CM | POA: Diagnosis present

## 2018-11-06 DIAGNOSIS — I714 Abdominal aortic aneurysm, without rupture: Secondary | ICD-10-CM | POA: Diagnosis not present

## 2018-11-06 DIAGNOSIS — R05 Cough: Secondary | ICD-10-CM | POA: Diagnosis not present

## 2018-11-06 DIAGNOSIS — R109 Unspecified abdominal pain: Secondary | ICD-10-CM

## 2018-11-06 DIAGNOSIS — F319 Bipolar disorder, unspecified: Secondary | ICD-10-CM | POA: Diagnosis present

## 2018-11-06 DIAGNOSIS — E039 Hypothyroidism, unspecified: Secondary | ICD-10-CM | POA: Diagnosis present

## 2018-11-06 DIAGNOSIS — L409 Psoriasis, unspecified: Secondary | ICD-10-CM | POA: Diagnosis present

## 2018-11-06 DIAGNOSIS — I700 Atherosclerosis of aorta: Secondary | ICD-10-CM | POA: Diagnosis not present

## 2018-11-06 DIAGNOSIS — R1031 Right lower quadrant pain: Secondary | ICD-10-CM

## 2018-11-06 DIAGNOSIS — R935 Abnormal findings on diagnostic imaging of other abdominal regions, including retroperitoneum: Secondary | ICD-10-CM | POA: Diagnosis not present

## 2018-11-06 DIAGNOSIS — Z7982 Long term (current) use of aspirin: Secondary | ICD-10-CM

## 2018-11-06 DIAGNOSIS — R110 Nausea: Secondary | ICD-10-CM

## 2018-11-06 DIAGNOSIS — R053 Chronic cough: Secondary | ICD-10-CM

## 2018-11-06 DIAGNOSIS — M7989 Other specified soft tissue disorders: Secondary | ICD-10-CM

## 2018-11-06 DIAGNOSIS — Z79899 Other long term (current) drug therapy: Secondary | ICD-10-CM

## 2018-11-06 DIAGNOSIS — K5909 Other constipation: Secondary | ICD-10-CM | POA: Diagnosis present

## 2018-11-06 DIAGNOSIS — I2510 Atherosclerotic heart disease of native coronary artery without angina pectoris: Secondary | ICD-10-CM | POA: Diagnosis not present

## 2018-11-06 DIAGNOSIS — L7632 Postprocedural hematoma of skin and subcutaneous tissue following other procedure: Secondary | ICD-10-CM | POA: Diagnosis present

## 2018-11-06 DIAGNOSIS — L7634 Postprocedural seroma of skin and subcutaneous tissue following other procedure: Secondary | ICD-10-CM | POA: Diagnosis present

## 2018-11-06 DIAGNOSIS — Z8673 Personal history of transient ischemic attack (TIA), and cerebral infarction without residual deficits: Secondary | ICD-10-CM

## 2018-11-06 DIAGNOSIS — N281 Cyst of kidney, acquired: Secondary | ICD-10-CM | POA: Diagnosis not present

## 2018-11-06 DIAGNOSIS — D72829 Elevated white blood cell count, unspecified: Secondary | ICD-10-CM | POA: Diagnosis not present

## 2018-11-06 DIAGNOSIS — R059 Cough, unspecified: Secondary | ICD-10-CM

## 2018-11-06 DIAGNOSIS — Z8249 Family history of ischemic heart disease and other diseases of the circulatory system: Secondary | ICD-10-CM

## 2018-11-06 DIAGNOSIS — I70212 Atherosclerosis of native arteries of extremities with intermittent claudication, left leg: Secondary | ICD-10-CM | POA: Diagnosis not present

## 2018-11-06 DIAGNOSIS — G2581 Restless legs syndrome: Secondary | ICD-10-CM | POA: Diagnosis present

## 2018-11-06 DIAGNOSIS — K219 Gastro-esophageal reflux disease without esophagitis: Secondary | ICD-10-CM | POA: Diagnosis present

## 2018-11-06 DIAGNOSIS — I739 Peripheral vascular disease, unspecified: Secondary | ICD-10-CM | POA: Diagnosis not present

## 2018-11-06 DIAGNOSIS — I10 Essential (primary) hypertension: Secondary | ICD-10-CM

## 2018-11-06 DIAGNOSIS — T8141XA Infection following a procedure, superficial incisional surgical site, initial encounter: Secondary | ICD-10-CM | POA: Diagnosis not present

## 2018-11-06 DIAGNOSIS — D62 Acute posthemorrhagic anemia: Secondary | ICD-10-CM | POA: Diagnosis not present

## 2018-11-06 DIAGNOSIS — T148XXA Other injury of unspecified body region, initial encounter: Secondary | ICD-10-CM

## 2018-11-06 DIAGNOSIS — T8149XA Infection following a procedure, other surgical site, initial encounter: Secondary | ICD-10-CM

## 2018-11-06 DIAGNOSIS — L089 Local infection of the skin and subcutaneous tissue, unspecified: Secondary | ICD-10-CM

## 2018-11-06 DIAGNOSIS — F419 Anxiety disorder, unspecified: Secondary | ICD-10-CM | POA: Diagnosis present

## 2018-11-06 DIAGNOSIS — Z48812 Encounter for surgical aftercare following surgery on the circulatory system: Secondary | ICD-10-CM | POA: Diagnosis not present

## 2018-11-06 DIAGNOSIS — Z833 Family history of diabetes mellitus: Secondary | ICD-10-CM

## 2018-11-06 DIAGNOSIS — E785 Hyperlipidemia, unspecified: Secondary | ICD-10-CM

## 2018-11-06 DIAGNOSIS — D638 Anemia in other chronic diseases classified elsewhere: Secondary | ICD-10-CM | POA: Diagnosis present

## 2018-11-06 DIAGNOSIS — F17210 Nicotine dependence, cigarettes, uncomplicated: Secondary | ICD-10-CM | POA: Diagnosis present

## 2018-11-06 DIAGNOSIS — Y838 Other surgical procedures as the cause of abnormal reaction of the patient, or of later complication, without mention of misadventure at the time of the procedure: Secondary | ICD-10-CM | POA: Diagnosis present

## 2018-11-06 LAB — URINALYSIS, ROUTINE W REFLEX MICROSCOPIC
BILIRUBIN URINE: NEGATIVE
Glucose, UA: NEGATIVE mg/dL
Ketones, ur: NEGATIVE mg/dL
Leukocytes,Ua: NEGATIVE
NITRITE: NEGATIVE
Protein, ur: NEGATIVE mg/dL
SPECIFIC GRAVITY, URINE: 1.006 (ref 1.005–1.030)
pH: 6 (ref 5.0–8.0)

## 2018-11-06 LAB — CBC WITH DIFFERENTIAL/PLATELET
Abs Immature Granulocytes: 0.08 10*3/uL — ABNORMAL HIGH (ref 0.00–0.07)
Basophils Absolute: 0.1 10*3/uL (ref 0.0–0.1)
Basophils Relative: 1 %
Eosinophils Absolute: 0.4 10*3/uL (ref 0.0–0.5)
Eosinophils Relative: 3 %
HCT: 36.2 % (ref 36.0–46.0)
Hemoglobin: 11.4 g/dL — ABNORMAL LOW (ref 12.0–15.0)
Immature Granulocytes: 1 %
Lymphocytes Relative: 25 %
Lymphs Abs: 2.8 10*3/uL (ref 0.7–4.0)
MCH: 29 pg (ref 26.0–34.0)
MCHC: 31.5 g/dL (ref 30.0–36.0)
MCV: 92.1 fL (ref 80.0–100.0)
Monocytes Absolute: 0.9 10*3/uL (ref 0.1–1.0)
Monocytes Relative: 8 %
Neutro Abs: 7.2 10*3/uL (ref 1.7–7.7)
Neutrophils Relative %: 62 %
Platelets: 517 10*3/uL — ABNORMAL HIGH (ref 150–400)
RBC: 3.93 MIL/uL (ref 3.87–5.11)
RDW: 13.9 % (ref 11.5–15.5)
WBC: 11.4 10*3/uL — ABNORMAL HIGH (ref 4.0–10.5)
nRBC: 0 % (ref 0.0–0.2)

## 2018-11-06 LAB — POCT CBC
Granulocyte percent: 65.7 %G (ref 37–80)
HCT, POC: 36.3 % (ref 29–41)
Hemoglobin: 11.9 g/dL (ref 11–14.6)
Lymph, poc: 3.5 — AB (ref 0.6–3.4)
MCH: 28.5 pg (ref 27–31.2)
MCHC: 32.8 g/dL (ref 31.8–35.4)
MCV: 86.7 fL (ref 76–111)
MID (cbc): 0.7 (ref 0–0.9)
MPV: 5.7 fL (ref 0–99.8)
POC Granulocyte: 8.2 — AB (ref 2–6.9)
POC LYMPH PERCENT: 28.4 %L (ref 10–50)
POC MID %: 5.9 %M (ref 0–12)
Platelet Count, POC: 664 10*3/uL — AB (ref 142–424)
RBC: 4.19 M/uL (ref 4.04–5.48)
RDW, POC: 15.2 %
WBC: 12.5 10*3/uL — AB (ref 4.6–10.2)

## 2018-11-06 LAB — LIPASE, BLOOD: Lipase: 23 U/L (ref 11–51)

## 2018-11-06 LAB — LACTIC ACID, PLASMA: Lactic Acid, Venous: 1.3 mmol/L (ref 0.5–1.9)

## 2018-11-06 NOTE — ED Triage Notes (Signed)
Pt arrives POV for eval of L post calf pain s/p fem pop bypass dc'd on 2/13. F/u in office today c/o L post leg pain and RLQ pain w/ palpation. Per MD Note, concern for SBO vs ileus, also w/ hx of appy in Oct and abcess requiring drain at that time. Pt endorses subjective fevers, states pain in L leg is worse than normal. Pt also w/ leukocytosis on labs today at office.

## 2018-11-06 NOTE — Progress Notes (Signed)
Subjective:    Patient ID: [name], female    DOB: 06/01/1946, 73 y.o.   MRN: [id number]  HPI [name] is a 73 y.o. female Presents today for: Chief Complaint  Patient presents with  . Follow-up    45% vein blocked, fixed with surgery    Peripheral vascular disease: Here for hospital follow-up.  Admitted initially February 11 to February 13th.  She underwent left femoral to below-knee popliteal artery bypass grafting.  Preoperatively left-sided ABI was 0.31 versus 0.97 on the right.  Did have an ulcer on her pretibial region that had been there for several months.  Postop 3 vessel Doppler signal improved ABI.  She is having difficulty with mobility and was transferred to inpatient rehab.  Plan for vascular surgery follow-up with Dr. fields in approximately 3 weeks.  Admitted to inpatient rehab from February 11 through February 18 secondary to debility from prior CVA and PVD with bypass grafting as above.  Husband with history of dementia and limited physical assistance.  Treated with pain management, did have acute blood loss anemia of 10.9, subcu Lovenox for DVT prophylaxis.  PT/OT therapies were initiated.  Comprehensive rehab program.  Discharge medications beyond her usual meds included Keflex 500 mg every 6 hours x14 days, oxycodone 1 to 2 tablets every 4 hours as needed.  She requested discharge to home, family teaching was completed.  Evaluation by neuropsychology 2/17-   indicated likely adjustment disorder, did not appear to have significant flare of previous bipolar disorder.  Nephew from [place] to be present for a few weeks.  Discussed further coping and adjustment strategies with patient at that time.  Feels doing ok at home. Nephew still in town.  Has home care nurse, PT, OT - PT there today.   Pain in leg better.  Still some soreness. Some areas of weeping from wound. Sees vascular surgeon tomorrow. Out of oxycodone since yesterday.  Has been using laxative as  well. Had been taking qd (initially twice per day). BM this am. Senokot once per day. miralax 3 nights ago.   Some abdominal cramping, nausea and chills since yesterday. Has not had fever. No new cough/congestion.  No vomiting.  Soft stool previously, 1 bm yesterday - no watery stool. Still taking keflex QID.  Lower abd cramping.  Nausea since yesterday. No new dysuria/frequency/urgency/hematuria.    No new calf pain. Has had longstanding soreness in lower leg.  Swelling in left ankle and foot since surgery - up and down since surgery. Improves with leg elevation.  No new chest pain or shortness of breath.    Patient Active Problem List   Diagnosis Date Noted  . Postoperative pain   . Sleep disturbance   . Tobacco abuse   . Acute blood loss anemia   . Hypoalbuminemia due to protein-calorie malnutrition ([place])   . Debility 10/24/2018  . Pre-operative cardiovascular examination 09/26/2018  . Dyslipidemia, goal LDL below 70 09/26/2018  . GAD (generalized anxiety disorder) 08/07/2018  . Major depressive disorder, single episode 08/07/2018  . Appendiceal abscess 07/08/2018  . PAD (peripheral artery disease) ([place]) 03/15/2018  . Atherosclerosis of native artery of left lower extremity with intermittent claudication ([place]) 03/15/2018  . Chronic left-sided low back pain with left-sided sciatica 12/25/2017  . Facial droop   . History of CVA (cerebrovascular accident) 10/23/2017  . Critical lower limb ischemia 11/08/2016  . Smoker 11/08/2016  . Postinflammatory pulmonary fibrosis ([place]) 02/14/2016  . Cigarette smoker 12/24/2015  .  Hypothyroid ? 01/16/2014  . Mild cognitive impairment 01/16/2014  . Meningioma ([place]) 10/21/2013  . Chest pain 10/20/2013  . Medicare annual wellness visit, subsequent 06/16/2013  . Dizziness and giddiness 06/16/2013  . RLS (restless legs syndrome) 04/29/2012  . Abdominal pain 12/27/2010  . DEGENERATIVE JOINT DISEASE 09/23/2010  . CAROTID ARTERY DISEASE  08/15/2010  . CHEST PAIN 08/15/2010  . HEADACHE 12/02/2009  . BACK PAIN 11/22/2009  . CONSTIPATION, CHRONIC 01/29/2008  . NAUSEA 01/28/2008  . DEPRESSION 03/08/2007  . Essential hypertension 03/08/2007   Past Medical History:  Diagnosis Date  . AAA (abdominal aortic aneurysm) (HCC)    3.1 cm 07/08/18, 3 year follow-up recommended; possible 3 cm AAA by aortogram 09/13/18  . Anemia    PMH  . Appendicitis with abscess    07/08/18, s/p perc drain; resolved 07/30/18 by CT  . Arthritis   . Bipolar disorder ([place])   . Cerebrovascular disease    intra and extracranial vascular dx per MRI 4/11, neurology rec strict CVRF control  . Colonic inertia   . Constipation    chronic;severe  . Coronary artery disease   . Depression   . Duodenitis   . EKG abnormalities    changes, stress test neg (false EKG changes)  . Gastritis   . GERD (gastroesophageal reflux disease)   . Hypertension   . Hypothyroid 01/16/2014  . Meningioma ([place]) 10/21/2013  . PAD (peripheral artery disease) ([place])   . Psoriasis    sees derm  . Stroke ([place])   . Wears dentures   . Wears glasses    Past Surgical History:  Procedure Laterality Date  . ABDOMINAL AORTOGRAM W/LOWER EXTREMITY N/A 09/13/2018   Procedure: ABDOMINAL AORTOGRAM W/LOWER EXTREMITY;  Surgeon: Elam Dutch, MD;  Location: [facility];  Service: Cardiovascular;  Laterality: N/A;  . arthroscopy  04/2010   Right knee  . CATARACT EXTRACTION W/ INTRAOCULAR LENS  IMPLANT, BILATERAL    . COLONOSCOPY    . FEMORAL-POPLITEAL BYPASS GRAFT  10/22/2018  . FEMORAL-POPLITEAL BYPASS GRAFT Left 10/22/2018   Procedure: LEFT FEMORAL TO BELOW THE KNEE POPLITEAL ARTERY BYPASS GRAFT;  Surgeon: Elam Dutch, MD;  Location: [facility];  Service: Vascular;  Laterality: Left;  . IR RADIOLOGIST EVAL & MGMT  07/30/2018  . MULTIPLE TOOTH EXTRACTIONS    . TUBAL LIGATION     No Known Allergies Prior to Admission medications   Medication Sig Start Date End Date Taking?  Authorizing Provider  amLODipine (NORVASC) 10 MG tablet Take 1 tablet (10 mg total) by mouth daily. 10/28/18  Yes Angiulli, [name], PA-C  aspirin EC 81 MG tablet Take 81 mg by mouth daily.   Yes [provider]  atorvastatin (LIPITOR) 40 MG tablet Take 1 tablet (40 mg total) by mouth daily. 10/28/18 01/26/19 Yes Angiulli, [name], PA-C  cephALEXin (KEFLEX) 500 MG capsule Take 1 capsule (500 mg total) by mouth every 6 (six) hours. 10/28/18  Yes Angiulli, [name], PA-C  clonazePAM (KLONOPIN) 0.5 MG tablet Take 0.5mg  tab every bedtime and 1 tab PRN for anxiety 10/28/18  Yes Angiulli, [name], PA-C  DULoxetine (CYMBALTA) 30 MG capsule Take 30 mg by mouth every evening.    Yes [provider]  DULoxetine (CYMBALTA) 60 MG capsule Take 60 mg by mouth daily.    Yes [provider]  famotidine (PEPCID) 20 MG tablet One at bedtime Patient taking differently: Take 20 mg by mouth daily as needed (nausea). One at bedtime 02/14/16  Yes Tanda Rockers, MD  lamoTRIgine (LAMICTAL) 150 MG tablet Take 1 tablet (150 mg total) by mouth 2 (two) times daily. Patient taking differently: Take 150 mg by mouth daily.  09/09/18  Yes Cottle, Billey Co., MD  linaclotide [facility]) 145 MCG CAPS capsule Take 1 capsule (145 mcg total) by mouth daily before breakfast. 10/28/18  Yes Angiulli, [name], PA-C  losartan (COZAAR) 50 MG tablet Take 1 tablet (50 mg total) by mouth daily. 10/25/17  Yes Rai, Ripudeep K, MD  oxyCODONE-acetaminophen (PERCOCET/ROXICET) 5-325 MG tablet Take 1-2 tablets by mouth every 4 (four) hours as needed for moderate pain. 10/28/18  Yes Angiulli, [name], PA-C  polyethylene glycol powder (GLYCOLAX/MIRALAX) powder Take 17 g by mouth every other day.   Yes [provider]  rOPINIRole (REQUIP) 2 MG tablet TAKE 1 TABLET BY MOUTH AT BEDTIME Patient taking differently: Take 2 mg by mouth at bedtime.  05/27/12  Yes Rise Mu, PA-C   Social History   Socioeconomic History  .  Marital status: Married    Spouse name: Not on file  . Number of children: 1  . Years of education: Not on file  . Highest education level: Not on file  Occupational History  . Occupation: retired  Scientific laboratory technician  . Financial resource strain: Not hard at all  . Food insecurity:    Worry: Patient refused    Inability: Patient refused  . Transportation needs:    Medical: Patient refused    Non-medical: Patient refused  Tobacco Use  . Smoking status: Current Every Day Smoker    Packs/day: 1.00    Years: 60.00    Pack years: 60.00    Types: Cigarettes  . Smokeless tobacco: Never Used  Substance and Sexual Activity  . Alcohol use: Yes    Alcohol/week: 0.0 standard drinks    Comment: yes on occassion  . Drug use: No  . Sexual activity: Yes  Lifestyle  . Physical activity:    Days per week: Patient refused    Minutes per session: Patient refused  . Stress: Not on file  Relationships  . Social connections:    Talks on phone: Patient refused    Gets together: Patient refused    Attends religious service: Patient refused    Active member of club or organization: Patient refused    Attends meetings of clubs or organizations: Patient refused    Relationship status: Patient refused  . Intimate partner violence:    Fear of current or ex partner: Patient refused    Emotionally abused: Patient refused    Physically abused: Patient refused    Forced sexual activity: Patient refused  Other Topics Concern  . Not on file  Social History Narrative   Brother in law Mr Rexford Maus (one of my patients)   Lives w/ husband        Review of Systems Per HPI.     Objective:   Physical Exam Vitals signs reviewed.  Constitutional:      Appearance: She is well-developed.  HENT:     Head: Normocephalic and atraumatic.  Eyes:     Conjunctiva/sclera: Conjunctivae normal.     Pupils: Pupils are equal, round, and reactive to light.  Neck:     Vascular: No carotid bruit.    Cardiovascular:     Rate and Rhythm: Normal rate and regular rhythm.     Heart sounds: Normal heart sounds.  Pulmonary:     Effort: Pulmonary effort is normal.  Breath sounds: Rales (coarse BS RLL>LLL.) present.  Abdominal:     General: Bowel sounds are increased. There is no distension.     Palpations: Abdomen is soft. There is no pulsatile mass.     Tenderness: There is abdominal tenderness (LLQ). There is no right CVA tenderness, left CVA tenderness, guarding or rebound.     Comments: Hyperactive bowel sounds.   Musculoskeletal:     Left lower leg: She exhibits tenderness and swelling (distal left lower leg. not proximal calf. min ttp lower lateral leg. 1+ pedal edema of left ankle. no wounds seen. ). Edema present.  Skin:    General: Skin is warm and dry.       Neurological:     Mental Status: She is alert and oriented to person, place, and time.  Psychiatric:        Behavior: Behavior normal.    Vitals:   11/06/18 1601  BP: 120/66  Pulse: 96  Resp: 17  Temp: (!) 97.3 F (36.3 C)  TempSrc: Oral  SpO2: 98%   No results found.  Results for orders placed or performed in visit on 11/06/18  POCT CBC  Result Value Ref Range   WBC 12.5 (A) 4.6 - 10.2 K/uL   Lymph, poc 3.5 (A) 0.6 - 3.4   POC LYMPH PERCENT 28.4 10 - 50 %L   MID (cbc) 0.7 0 - 0.9   POC MID % 5.9 0 - 12 %M   POC Granulocyte 8.2 (A) 2 - 6.9   Granulocyte percent 65.7 37 - 80 %G   RBC 4.19 4.04 - 5.48 M/uL   Hemoglobin 11.9 11 - 14.6 g/dL   HCT, POC 36.3 29 - 41 %   MCV 86.7 76 - 111 fL   MCH, POC 28.5 27 - 31.2 pg   MCHC 32.8 31.8 - 35.4 g/dL   RDW, POC 15.2 %   Platelet Count, POC 664 (A) 142 - 424 K/uL   MPV 5.7 0 - 99.8 fL   No results found.      Assessment & Plan:  [name] is a 73 y.o. female Abdominal pain, RLQ - Plan: DG ABD ACUTE 2+V W 1V CHEST, POCT CBC  PVD (peripheral vascular disease) (HCC)  Nausea without vomiting - Plan: DG ABD ACUTE 2+V W 1V CHEST, POCT CBC, POCT  urinalysis dipstick  Cough - Plan: POCT CBC, DG Chest 1 View  Chronic cough  Chills - Plan: POCT CBC, DG Chest 1 View  Leukocytosis, unspecified type  Left leg swelling  Abnormal abdominal x-ray   Postop day 15 from femoropopliteal bypass.  Has been steadily but slowly improving.  Unfortunately now with 2-day history of chills, nausea, abdominal pain.  Leukocytosis noted in office as well as right lower quadrant abdominal pain.  I did hear some possible rhonchi versus rales in her right lower lobe compared to left lower lobe.  Differential includes atelectasis versus pneumonia.  Also with left calf pain which she notes has been present for some time but is tender over her distal left calf with left ankle swelling.  Suspect some of that may be due to prior surgery and she does report improves with elevation.  Hospitalized October 28 through July 16 2018 with acute perforated appendicitis with abscess, status post percutaneous drain. 07/30/18 CT indicated resolution of periappendiceal abscess.Recent hospitalization with continued antibiotics.  Differential initially included C. difficile colitis, but denies any true watery stools. ditaed bowel noted on XR.  possible ileus vs SBO.   -  recommended evaluation through emergency room tonight to decide on possible further abdominal imaging, and further workup.    Patient Instructions    To me the wounds look okay today.  I think is reasonable to discuss the episodic discharge with your surgeon as planned tomorrow.  However with the new abdominal pains, nausea, and chills I am concerned about an abdominal infection.  Your infection fighting cells were higher today than previously checked, indicating a possible infection.  Although the swelling in the leg has been there for some time, I am still concerned somewhat about the calf pain on that side as well and may need to have an ultrasound.  Unfortunately with recent surgery there can be a few  different causes of fever and infection.  Please proceed to the emergency room for further evaluation tonight.    If you have lab work done today you will be contacted with your lab results within the next 2 weeks.  If you have not heard from [place] then please contact us. The fastest way to get your results is to register for My Chart.   IF you received an x-ray today, you will receive an invoice from [facility]. Please contact [facility] at [phone] with questions or concerns regarding your invoice.   IF you received labwork today, you will receive an invoice from [facility]. Please contact [facility] at [phone] with questions or concerns regarding your invoice.   Our billing staff will not be able to assist you with questions regarding bills from these companies.  You will be contacted with the lab results as soon as they are available. The fastest way to get your results is to activate your My Chart account. Instructions are located on the last page of this paperwork. If you have not heard from [place] regarding the results in 2 weeks, please contact this office.

## 2018-11-06 NOTE — Patient Instructions (Addendum)
  To me the wounds look okay today.  I think is reasonable to discuss the episodic discharge with your surgeon as planned tomorrow.  However with the new abdominal pains, nausea, and chills I am concerned about an abdominal infection.  Your infection fighting cells were higher today than previously checked, indicating a possible infection.  Although the swelling in the leg has been there for some time, I am still concerned somewhat about the calf pain on that side as well and may need to have an ultrasound.  Unfortunately with recent surgery there can be a few different causes of fever and infection.  Please proceed to the emergency room for further evaluation tonight.    If you have lab work done today you will be contacted with your lab results within the next 2 weeks.  If you have not heard from [place] then please contact us. The fastest way to get your results is to register for My Chart.   IF you received an x-ray today, you will receive an invoice from [facility]. Please contact [facility] at [phone] with questions or concerns regarding your invoice.   IF you received labwork today, you will receive an invoice from [facility]. Please contact [facility] at [phone] with questions or concerns regarding your invoice.   Our billing staff will not be able to assist you with questions regarding bills from these companies.  You will be contacted with the lab results as soon as they are available. The fastest way to get your results is to activate your My Chart account. Instructions are located on the last page of this paperwork. If you have not heard from [place] regarding the results in 2 weeks, please contact this office.

## 2018-11-07 ENCOUNTER — Emergency Department (HOSPITAL_COMMUNITY): Payer: Medicare Other

## 2018-11-07 ENCOUNTER — Encounter: Payer: Medicare Other | Admitting: Vascular Surgery

## 2018-11-07 ENCOUNTER — Inpatient Hospital Stay (HOSPITAL_COMMUNITY): Payer: Medicare Other

## 2018-11-07 DIAGNOSIS — D638 Anemia in other chronic diseases classified elsewhere: Secondary | ICD-10-CM | POA: Diagnosis not present

## 2018-11-07 DIAGNOSIS — F419 Anxiety disorder, unspecified: Secondary | ICD-10-CM | POA: Diagnosis not present

## 2018-11-07 DIAGNOSIS — Z8249 Family history of ischemic heart disease and other diseases of the circulatory system: Secondary | ICD-10-CM | POA: Diagnosis not present

## 2018-11-07 DIAGNOSIS — E039 Hypothyroidism, unspecified: Secondary | ICD-10-CM | POA: Diagnosis present

## 2018-11-07 DIAGNOSIS — F17210 Nicotine dependence, cigarettes, uncomplicated: Secondary | ICD-10-CM | POA: Diagnosis not present

## 2018-11-07 DIAGNOSIS — K219 Gastro-esophageal reflux disease without esophagitis: Secondary | ICD-10-CM | POA: Diagnosis not present

## 2018-11-07 DIAGNOSIS — N281 Cyst of kidney, acquired: Secondary | ICD-10-CM | POA: Diagnosis not present

## 2018-11-07 DIAGNOSIS — E785 Hyperlipidemia, unspecified: Secondary | ICD-10-CM | POA: Diagnosis present

## 2018-11-07 DIAGNOSIS — T148XXA Other injury of unspecified body region, initial encounter: Secondary | ICD-10-CM | POA: Diagnosis not present

## 2018-11-07 DIAGNOSIS — M7989 Other specified soft tissue disorders: Secondary | ICD-10-CM

## 2018-11-07 DIAGNOSIS — L089 Local infection of the skin and subcutaneous tissue, unspecified: Secondary | ICD-10-CM | POA: Diagnosis not present

## 2018-11-07 DIAGNOSIS — Z8673 Personal history of transient ischemic attack (TIA), and cerebral infarction without residual deficits: Secondary | ICD-10-CM | POA: Diagnosis not present

## 2018-11-07 DIAGNOSIS — T8141XA Infection following a procedure, superficial incisional surgical site, initial encounter: Secondary | ICD-10-CM | POA: Diagnosis not present

## 2018-11-07 DIAGNOSIS — I9789 Other postprocedural complications and disorders of the circulatory system, not elsewhere classified: Secondary | ICD-10-CM | POA: Diagnosis not present

## 2018-11-07 DIAGNOSIS — Z833 Family history of diabetes mellitus: Secondary | ICD-10-CM | POA: Diagnosis not present

## 2018-11-07 DIAGNOSIS — Z7982 Long term (current) use of aspirin: Secondary | ICD-10-CM | POA: Diagnosis not present

## 2018-11-07 DIAGNOSIS — I97638 Postprocedural hematoma of a circulatory system organ or structure following other circulatory system procedure: Secondary | ICD-10-CM | POA: Diagnosis not present

## 2018-11-07 DIAGNOSIS — Y838 Other surgical procedures as the cause of abnormal reaction of the patient, or of later complication, without mention of misadventure at the time of the procedure: Secondary | ICD-10-CM | POA: Diagnosis present

## 2018-11-07 DIAGNOSIS — K5909 Other constipation: Secondary | ICD-10-CM | POA: Diagnosis not present

## 2018-11-07 DIAGNOSIS — I10 Essential (primary) hypertension: Secondary | ICD-10-CM | POA: Diagnosis not present

## 2018-11-07 DIAGNOSIS — M79609 Pain in unspecified limb: Secondary | ICD-10-CM

## 2018-11-07 DIAGNOSIS — I739 Peripheral vascular disease, unspecified: Secondary | ICD-10-CM | POA: Diagnosis present

## 2018-11-07 DIAGNOSIS — L7634 Postprocedural seroma of skin and subcutaneous tissue following other procedure: Secondary | ICD-10-CM | POA: Diagnosis not present

## 2018-11-07 DIAGNOSIS — R600 Localized edema: Secondary | ICD-10-CM | POA: Diagnosis not present

## 2018-11-07 DIAGNOSIS — L409 Psoriasis, unspecified: Secondary | ICD-10-CM | POA: Diagnosis present

## 2018-11-07 DIAGNOSIS — L7632 Postprocedural hematoma of skin and subcutaneous tissue following other procedure: Secondary | ICD-10-CM | POA: Diagnosis not present

## 2018-11-07 DIAGNOSIS — G2581 Restless legs syndrome: Secondary | ICD-10-CM | POA: Diagnosis present

## 2018-11-07 DIAGNOSIS — L538 Other specified erythematous conditions: Secondary | ICD-10-CM | POA: Diagnosis not present

## 2018-11-07 DIAGNOSIS — J8410 Pulmonary fibrosis, unspecified: Secondary | ICD-10-CM | POA: Diagnosis present

## 2018-11-07 DIAGNOSIS — I714 Abdominal aortic aneurysm, without rupture: Secondary | ICD-10-CM | POA: Diagnosis not present

## 2018-11-07 DIAGNOSIS — I2510 Atherosclerotic heart disease of native coronary artery without angina pectoris: Secondary | ICD-10-CM | POA: Diagnosis not present

## 2018-11-07 DIAGNOSIS — Z79899 Other long term (current) drug therapy: Secondary | ICD-10-CM | POA: Diagnosis not present

## 2018-11-07 DIAGNOSIS — R609 Edema, unspecified: Secondary | ICD-10-CM

## 2018-11-07 DIAGNOSIS — F319 Bipolar disorder, unspecified: Secondary | ICD-10-CM | POA: Diagnosis not present

## 2018-11-07 LAB — COMPREHENSIVE METABOLIC PANEL
ALK PHOS: 140 U/L — AB (ref 38–126)
ALT: 26 U/L (ref 0–44)
AST: 23 U/L (ref 15–41)
Albumin: 3.6 g/dL (ref 3.5–5.0)
Anion gap: 11 (ref 5–15)
BILIRUBIN TOTAL: 0.6 mg/dL (ref 0.3–1.2)
BUN: 16 mg/dL (ref 8–23)
CO2: 25 mmol/L (ref 22–32)
Calcium: 9.5 mg/dL (ref 8.9–10.3)
Chloride: 95 mmol/L — ABNORMAL LOW (ref 98–111)
Creatinine, Ser: 0.89 mg/dL (ref 0.44–1.00)
GFR calc Af Amer: >60 mL/min (ref >60)
GFR calc non Af Amer: >60 mL/min (ref >60)
Glucose, Bld: 98 mg/dL (ref 70–99)
Potassium: 3.6 mmol/L (ref 3.5–5.1)
Sodium: 131 mmol/L — ABNORMAL LOW (ref 135–145)
TOTAL PROTEIN: 7.5 g/dL (ref 6.5–8.1)

## 2018-11-07 MED ORDER — LAMOTRIGINE 100 MG PO TABS
150.0000 mg | ORAL_TABLET | Freq: Every day | ORAL | Status: DC
  Administered 2018-11-07 – 2018-11-09 (×3): 150 mg via ORAL
  Filled 2018-11-07: qty 1
  Filled 2018-11-07: qty 2
  Filled 2018-11-07: qty 1

## 2018-11-07 MED ORDER — LINACLOTIDE 145 MCG PO CAPS
145.0000 ug | ORAL_CAPSULE | Freq: Every day | ORAL | Status: DC
  Administered 2018-11-07 – 2018-11-09 (×3): 145 ug via ORAL
  Filled 2018-11-07 (×3): qty 1

## 2018-11-07 MED ORDER — AMLODIPINE BESYLATE 10 MG PO TABS
10.0000 mg | ORAL_TABLET | Freq: Every day | ORAL | Status: DC
  Administered 2018-11-07 – 2018-11-09 (×3): 10 mg via ORAL
  Filled 2018-11-07 (×3): qty 1

## 2018-11-07 MED ORDER — PIPERACILLIN-TAZOBACTAM 3.375 G IVPB
3.3750 g | Freq: Three times a day (TID) | INTRAVENOUS | Status: DC
  Administered 2018-11-07 – 2018-11-09 (×5): 3.375 g via INTRAVENOUS
  Filled 2018-11-07 (×7): qty 50

## 2018-11-07 MED ORDER — ENOXAPARIN SODIUM 40 MG/0.4ML ~~LOC~~ SOLN
40.0000 mg | SUBCUTANEOUS | Status: DC
  Administered 2018-11-07 – 2018-11-09 (×2): 40 mg via SUBCUTANEOUS
  Filled 2018-11-07 (×2): qty 0.4

## 2018-11-07 MED ORDER — VANCOMYCIN HCL 10 G IV SOLR
1250.0000 mg | INTRAVENOUS | Status: DC
  Administered 2018-11-07 – 2018-11-08 (×2): 1250 mg via INTRAVENOUS
  Filled 2018-11-07 (×2): qty 1250

## 2018-11-07 MED ORDER — IOHEXOL 300 MG/ML  SOLN
100.0000 mL | Freq: Once | INTRAMUSCULAR | Status: AC | PRN
  Administered 2018-11-07: 100 mL via INTRAVENOUS

## 2018-11-07 MED ORDER — CLONAZEPAM 0.5 MG PO TABS
0.5000 mg | ORAL_TABLET | Freq: Every day | ORAL | Status: DC
  Administered 2018-11-07 – 2018-11-08 (×2): 0.5 mg via ORAL
  Filled 2018-11-07 (×2): qty 1

## 2018-11-07 MED ORDER — ROPINIROLE HCL 1 MG PO TABS
2.0000 mg | ORAL_TABLET | Freq: Once | ORAL | Status: AC
  Administered 2018-11-07: 2 mg via ORAL
  Filled 2018-11-07: qty 2

## 2018-11-07 MED ORDER — ASPIRIN EC 81 MG PO TBEC
81.0000 mg | DELAYED_RELEASE_TABLET | Freq: Every day | ORAL | Status: DC
  Administered 2018-11-07 – 2018-11-09 (×3): 81 mg via ORAL
  Filled 2018-11-07 (×3): qty 1

## 2018-11-07 MED ORDER — ACETAMINOPHEN 325 MG PO TABS: 650.0000 mg | ORAL_TABLET | Freq: Four times a day (QID) | ORAL | Status: DC | PRN

## 2018-11-07 MED ORDER — ROPINIROLE HCL 1 MG PO TABS
2.0000 mg | ORAL_TABLET | Freq: Every day | ORAL | Status: DC
  Administered 2018-11-07 – 2018-11-08 (×2): 2 mg via ORAL
  Filled 2018-11-07 (×2): qty 2

## 2018-11-07 MED ORDER — VANCOMYCIN HCL IN DEXTROSE 1-5 GM/200ML-% IV SOLN
1000.0000 mg | Freq: Once | INTRAVENOUS | Status: AC
  Administered 2018-11-07: 1000 mg via INTRAVENOUS
  Filled 2018-11-07: qty 200

## 2018-11-07 MED ORDER — DULOXETINE HCL 30 MG PO CPEP
30.0000 mg | ORAL_CAPSULE | Freq: Every evening | ORAL | Status: DC
  Administered 2018-11-07 – 2018-11-08 (×2): 30 mg via ORAL
  Filled 2018-11-07 (×2): qty 1

## 2018-11-07 MED ORDER — FAMOTIDINE 20 MG PO TABS: 20.0000 mg | ORAL_TABLET | Freq: Every day | ORAL | Status: DC | PRN

## 2018-11-07 MED ORDER — CLONAZEPAM 0.5 MG PO TABS
0.5000 mg | ORAL_TABLET | Freq: Every day | ORAL | Status: DC | PRN
  Administered 2018-11-07 – 2018-11-09 (×2): 0.5 mg via ORAL
  Filled 2018-11-07: qty 1

## 2018-11-07 MED ORDER — PIPERACILLIN-TAZOBACTAM 3.375 G IVPB 30 MIN
3.3750 g | Freq: Once | INTRAVENOUS | Status: AC
  Administered 2018-11-07: 3.375 g via INTRAVENOUS
  Filled 2018-11-07: qty 50

## 2018-11-07 MED ORDER — OXYCODONE-ACETAMINOPHEN 5-325 MG PO TABS
1.0000 | ORAL_TABLET | Freq: Four times a day (QID) | ORAL | Status: DC | PRN
  Administered 2018-11-07 (×2): 2 via ORAL
  Administered 2018-11-08 (×2): 1 via ORAL
  Administered 2018-11-08: 2 via ORAL
  Administered 2018-11-08 – 2018-11-09 (×2): 1 via ORAL
  Filled 2018-11-07: qty 2
  Filled 2018-11-07: qty 1
  Filled 2018-11-07: qty 2
  Filled 2018-11-07 (×3): qty 1
  Filled 2018-11-07 (×2): qty 2

## 2018-11-07 MED ORDER — VANCOMYCIN HCL IN DEXTROSE 750-5 MG/150ML-% IV SOLN: 750.0000 mg | Freq: Two times a day (BID) | INTRAVENOUS | Status: DC

## 2018-11-07 MED ORDER — ACETAMINOPHEN 650 MG RE SUPP: 650.0000 mg | Freq: Four times a day (QID) | RECTAL | Status: DC | PRN

## 2018-11-07 MED ORDER — DULOXETINE HCL 60 MG PO CPEP
60.0000 mg | ORAL_CAPSULE | Freq: Every morning | ORAL | Status: DC
  Administered 2018-11-07 – 2018-11-09 (×3): 60 mg via ORAL
  Filled 2018-11-07 (×3): qty 1

## 2018-11-07 MED ORDER — ONDANSETRON HCL 4 MG/2ML IJ SOLN
4.0000 mg | Freq: Once | INTRAMUSCULAR | Status: AC
  Administered 2018-11-07: 4 mg via INTRAVENOUS
  Filled 2018-11-07: qty 2

## 2018-11-07 MED ORDER — ONDANSETRON HCL 4 MG/2ML IJ SOLN: 4.0000 mg | Freq: Four times a day (QID) | INTRAMUSCULAR | Status: DC | PRN

## 2018-11-07 MED ORDER — FENTANYL CITRATE (PF) 100 MCG/2ML IJ SOLN
50.0000 ug | Freq: Once | INTRAMUSCULAR | Status: AC
  Administered 2018-11-07: 50 ug via INTRAVENOUS
  Filled 2018-11-07: qty 2

## 2018-11-07 MED ORDER — ATORVASTATIN CALCIUM 40 MG PO TABS
40.0000 mg | ORAL_TABLET | Freq: Every day | ORAL | Status: DC
  Administered 2018-11-07 – 2018-11-09 (×3): 40 mg via ORAL
  Filled 2018-11-07 (×3): qty 1

## 2018-11-07 NOTE — Progress Notes (Signed)
Left Lower ext venous study  has been completed. Refer to [facility] under chart review to view preliminary results.   11/07/2018  9:29 AM Nevayah Faust, Bonnye Fava

## 2018-11-07 NOTE — H&P (Signed)
History and Physical    JAPJI KOK UKG:[id number] DOB: [birth year] DOA: 11/06/2018  PCP: [name], MD Patient coming from: PCPs office  Chief Complaint: Abdominal pain, left leg pain  HPI: [name] is a 73 y.o. female with medical history significant of bipolar disorder, AAA, CVA, CAD, hypertension, tobacco abuse, peripheral vascular disease with recent left femoral to below the knee popliteal artery bypass graft done 2/11 resenting with complaints of abdominal pain and left leg pain. Patient reports having intermittent, sharp right lower quadrant abdominal pain associated with nausea for the past 2 to 3 months.  No episodes of vomiting.  She takes laxatives and has noticed loose stools for the past 2 to 3 days.  She has been having pain in her left calf for the past 2 years.  Since her recent femoral-popliteal bypass surgery she has noticed swelling in her left lower leg.  She noticed erythema and little bit of blood drainage from her suture sites for the past 1 day.  Reports having chills.  No fevers.  Denies any history of blood clots.  Review of Systems: As per HPI otherwise 10 point review of systems negative.  Past Medical History:  Diagnosis Date  . AAA (abdominal aortic aneurysm) (HCC)    3.1 cm 07/08/18, 3 year follow-up recommended; possible 3 cm AAA by aortogram 09/13/18  . Anemia    PMH  . Appendicitis with abscess    07/08/18, s/p perc drain; resolved 07/30/18 by CT  . Arthritis   . Bipolar disorder ([place])   . Cerebrovascular disease    intra and extracranial vascular dx per MRI 4/11, neurology rec strict CVRF control  . Colonic inertia   . Constipation    chronic;severe  . Coronary artery disease   . Depression   . Duodenitis   . EKG abnormalities    changes, stress test neg (false EKG changes)  . Gastritis   . GERD (gastroesophageal reflux disease)   . Hypertension   . Hypothyroid 01/16/2014  . Meningioma ([place]) 10/21/2013  . PAD (peripheral artery  disease) ([place])   . Psoriasis    sees derm  . Stroke ([place])   . Wears dentures   . Wears glasses     Past Surgical History:  Procedure Laterality Date  . ABDOMINAL AORTOGRAM W/LOWER EXTREMITY N/A 09/13/2018   Procedure: ABDOMINAL AORTOGRAM W/LOWER EXTREMITY;  Surgeon: Elam Dutch, MD;  Location: [facility];  Service: Cardiovascular;  Laterality: N/A;  . arthroscopy  04/2010   Right knee  . CATARACT EXTRACTION W/ INTRAOCULAR LENS  IMPLANT, BILATERAL    . COLONOSCOPY    . FEMORAL-POPLITEAL BYPASS GRAFT  10/22/2018  . FEMORAL-POPLITEAL BYPASS GRAFT Left 10/22/2018   Procedure: LEFT FEMORAL TO BELOW THE KNEE POPLITEAL ARTERY BYPASS GRAFT;  Surgeon: Elam Dutch, MD;  Location: [facility];  Service: Vascular;  Laterality: Left;  . IR RADIOLOGIST EVAL & MGMT  07/30/2018  . MULTIPLE TOOTH EXTRACTIONS    . TUBAL LIGATION       reports that she has been smoking cigarettes. She has a 60.00 pack-year smoking history. She has never used smokeless tobacco. She reports current alcohol use. She reports that she does not use drugs.  No Known Allergies  Family History  Problem Relation Age of Onset  . Breast cancer Mother        metastisis to bones  . Diabetes Son   . Heart disease Other        grandfather   .  Alcohol abuse Brother   . Heart disease Brother   . Heart disease Maternal Aunt   . Lung cancer Brother        smoked  . Colon cancer Neg Hx     Prior to Admission medications   Medication Sig Start Date End Date Taking? Authorizing Provider  amLODipine (NORVASC) 10 MG tablet Take 1 tablet (10 mg total) by mouth daily. 10/28/18  Yes Angiulli, [name], PA-C  aspirin EC 81 MG tablet Take 81 mg by mouth daily.   Yes [provider]  atorvastatin (LIPITOR) 40 MG tablet Take 1 tablet (40 mg total) by mouth daily. 10/28/18 01/26/19 Yes Angiulli, [name], PA-C  Bisacodyl (LAXATIVE PO) Take 1 tablet by mouth daily.   Yes [provider]  cephALEXin (KEFLEX) 500  MG capsule Take 1 capsule (500 mg total) by mouth every 6 (six) hours. Patient taking differently: Take 500 mg by mouth every 6 (six) hours. FOR 14 DAYS 10/28/18  Yes Angiulli, [name], PA-C  clonazePAM (KLONOPIN) 0.5 MG tablet Take 0.5mg  tab every bedtime and 1 tab PRN for anxiety Patient taking differently: Take 0.5 mg by mouth See admin instructions. Take 0.5 mg by mouth at bedtime and an additional 0.5 mg once a day as needed for anxiety 10/28/18  Yes Angiulli, [name], PA-C  DULoxetine (CYMBALTA) 30 MG capsule Take 30 mg by mouth every evening.    Yes [provider]  DULoxetine (CYMBALTA) 60 MG capsule Take 60 mg by mouth every morning.    Yes [provider]  famotidine (PEPCID) 20 MG tablet One at bedtime Patient taking differently: Take 20 mg by mouth daily as needed for heartburn or indigestion (or nausea).  02/14/16  Yes Tanda Rockers, MD  lamoTRIgine (LAMICTAL) 150 MG tablet Take 1 tablet (150 mg total) by mouth 2 (two) times daily. Patient taking differently: Take 150 mg by mouth daily.  09/09/18  Yes Cottle, Billey Co., MD  linaclotide [facility]) 145 MCG CAPS capsule Take 1 capsule (145 mcg total) by mouth daily before breakfast. Patient taking differently: Take 145 mcg by mouth at bedtime.  10/28/18  Yes Angiulli, [name], PA-C  losartan (COZAAR) 50 MG tablet Take 1 tablet (50 mg total) by mouth daily. 10/25/17  Yes Rai, Ripudeep K, MD  polyethylene glycol powder (GLYCOLAX/MIRALAX) powder Take 17 g by mouth every other day.   Yes [provider]  rOPINIRole (REQUIP) 2 MG tablet TAKE 1 TABLET BY MOUTH AT BEDTIME Patient taking differently: Take 2 mg by mouth at bedtime.  05/27/12  Yes Dunn, Areta Haber, PA-C  sennosides-docusate sodium (SENOKOT-S) 8.6-50 MG tablet Take 1 tablet by mouth daily.   Yes [provider]  oxyCODONE-acetaminophen (PERCOCET/ROXICET) 5-325 MG tablet Take 1-2 tablets by mouth every 4 (four) hours as needed for moderate pain. Patient  not taking: Reported on 11/07/2018 10/28/18   Cathlyn Parsons, PA-C    Physical Exam: Vitals:   11/07/18 0400 11/07/18 0445 11/07/18 0531 11/07/18 0818  BP: 104/72 (!) 116/59 (!) 130/119 (!) 154/73  Pulse: 69 66 65 78  Resp:   18   Temp:   97.8 F (36.6 C) 98.1 F (36.7 C)  TempSrc:   Oral Oral  SpO2: 98% 97% 100% 94%  Weight:   73.9 kg   Height:   5\' 8"  (1.727 m)     Physical Exam  Constitutional: She is oriented to person, place, and time. She appears well-developed and well-nourished. No distress.  HENT:  Head: Normocephalic.  Mouth/Throat: Oropharynx is clear and moist.  Eyes: Right eye exhibits no discharge. Left eye exhibits no discharge.  Neck: Neck supple.  Cardiovascular: Normal rate, regular rhythm and intact distal pulses.  Pulmonary/Chest: Effort normal. No respiratory distress. She has no wheezes. She has rales.  Bibasilar rales  Abdominal: Soft. Bowel sounds are normal. She exhibits no distension. There is abdominal tenderness. There is no rebound and no guarding.  Mild periumbilical and left lower quadrant tenderness.  No right lower quadrant tenderness.  Musculoskeletal:        General: Edema present.     Comments: Left lower extremity appears erythematous, edematous, and warm to touch. Suture sites from recent femoral-popliteal bypass surgery with minimal sanguinous drainage and surrounding erythema and significant induration. Please see images. Left lower extremity posterior tibial and dorsalis pedis pulses dopplerable.  Neurological: She is alert and oriented to person, place, and time.  Skin: Skin is warm and dry. She is not diaphoretic.         Labs on Admission: I have personally reviewed following labs and imaging studies  CBC: Recent Labs  Lab 11/06/18 1745 11/06/18 2031  WBC 12.5* 11.4*  NEUTROABS  --  7.2  HGB 11.9 11.4*  HCT 36.3 36.2  MCV 86.7 92.1  PLT  --  299*   Basic Metabolic Panel: Recent Labs  Lab 11/06/18 2031  NA 131*   K 3.6  CL 95*  CO2 25  GLUCOSE 98  BUN 16  CREATININE 0.89  CALCIUM 9.5   GFR: Estimated Creatinine Clearance: 55.1 mL/min (by C-G formula based on SCr of 0.89 mg/dL). Liver Function Tests: Recent Labs  Lab 11/06/18 2031  AST 23  ALT 26  ALKPHOS 140*  BILITOT 0.6  PROT 7.5  ALBUMIN 3.6   Recent Labs  Lab 11/06/18 2031  LIPASE 23   No results for input(s): AMMONIA in the last 168 hours. Coagulation Profile: No results for input(s): INR, PROTIME in the last 168 hours. Cardiac Enzymes: No results for input(s): CKTOTAL, CKMB, CKMBINDEX, TROPONINI in the last 168 hours. BNP (last 3 results) No results for input(s): PROBNP in the last 8760 hours. HbA1C: No results for input(s): HGBA1C in the last 72 hours. CBG: No results for input(s): GLUCAP in the last 168 hours. Lipid Profile: No results for input(s): CHOL, HDL, LDLCALC, TRIG, CHOLHDL, LDLDIRECT in the last 72 hours. Thyroid Function Tests: No results for input(s): TSH, T4TOTAL, FREET4, T3FREE, THYROIDAB in the last 72 hours. Anemia Panel: No results for input(s): VITAMINB12, FOLATE, FERRITIN, TIBC, IRON, RETICCTPCT in the last 72 hours. Urine analysis:    Component Value Date/Time   COLORURINE YELLOW 11/06/2018 2040   APPEARANCEUR CLEAR 11/06/2018 2040   LABSPEC 1.006 11/06/2018 2040   PHURINE 6.0 11/06/2018 2040   GLUCOSEU NEGATIVE 11/06/2018 2040   HGBUR SMALL (A) 11/06/2018 2040   BILIRUBINUR NEGATIVE 11/06/2018 2040   BILIRUBINUR neg 07/31/2013 1752   KETONESUR NEGATIVE 11/06/2018 2040   PROTEINUR NEGATIVE 11/06/2018 2040   UROBILINOGEN 0.2 07/31/2013 1752   NITRITE NEGATIVE 11/06/2018 2040   LEUKOCYTESUR NEGATIVE 11/06/2018 2040    Radiological Exams on Admission: Dg Chest 1 View  Result Date: 11/06/2018 CLINICAL DATA:  Cough EXAM: CHEST  1 VIEW COMPARISON:  Abdomen series November 06, 2018; frontal and lateral chest radiographs June 28, 2018 FINDINGS: Lateral chest radiograph obtained. No edema  or consolidation seen on lateral view. Heart size within normal limits. There is aortic atherosclerosis. There is degenerative change in  the thoracic spine. IMPRESSION: No evident edema or consolidation. Heart size within normal limits. Aortic Atherosclerosis (ICD10-I70.0). Electronically Signed   By: Lowella Grip III M.D.   On: 11/06/2018 18:02   Dg Chest 2 View  Result Date: 11/06/2018 CLINICAL DATA:  Leukocytosis EXAM: CHEST - 2 VIEW COMPARISON:  07/07/2019 FINDINGS: Hyperinflation of the lungs. Diffuse interstitial prominence throughout the lungs is similar to prior study and likely reflects chronic interstitial lung disease. No acute confluent airspace opacities or effusions. Aortic atherosclerosis. No acute bony abnormality. IMPRESSION: COPD.  Chronic interstitial changes.  No active disease. Electronically Signed   By: Rolm Baptise M.D.   On: 11/06/2018 21:15   Ct Abdomen Pelvis W Contrast  Result Date: 11/07/2018 CLINICAL DATA:  Acute generalized abdominal pain and fever. Right lower quadrant pain on palpation. Left posterior leg pain. Patient had a femoral popliteal bypass on 02/13. EXAM: CT ABDOMEN AND PELVIS WITH CONTRAST TECHNIQUE: Multidetector CT imaging of the abdomen and pelvis was performed using the standard protocol following bolus administration of intravenous contrast. CONTRAST:  137mL OMNIPAQUE IOHEXOL 300 MG/ML  SOLN COMPARISON:  07/30/2018 FINDINGS: Lower chest: Fibrosis in the lung bases. Hepatobiliary: No focal liver abnormality is seen. No gallstones, gallbladder wall thickening, or biliary dilatation. Pancreas: Unremarkable. No pancreatic ductal dilatation or surrounding inflammatory changes. Spleen: Normal in size without focal abnormality. Adrenals/Urinary Tract: No adrenal gland nodules. Small bilateral renal parenchymal cysts. No hydronephrosis or hydroureter. Bladder is unremarkable. Stomach/Bowel: Stomach, small bowel, and colon are not abnormally distended. No wall  thickening or inflammatory changes are seen. Appendix is not identified. Vascular/Lymphatic: Aortic atherosclerosis. No enlarged abdominal or pelvic lymph nodes. Reproductive: Uterus and bilateral adnexa are unremarkable. Other: No free air or free fluid in the abdomen. Abdominal wall musculature appears intact. Infiltration in the subcutaneous fat over the left groin region likely postoperative. No abscess. Musculoskeletal: Degenerative changes in the spine. No destructive bone lesions. IMPRESSION: No acute process demonstrated in the abdomen or pelvis. No evidence of bowel obstruction or inflammation. Fibrosis in the lung bases. Electronically Signed   By: Lucienne Capers M.D.   On: 11/07/2018 03:04   Dg Abd Acute 2+v W 1v Chest  Result Date: 11/06/2018 CLINICAL DATA:  Abdominal pain and nausea with cough EXAM: DG ABDOMEN ACUTE W/ 1V CHEST COMPARISON:  Chest radiograph July 08, 2018; CT abdomen and pelvis July 30, 2018 FINDINGS: PA chest: No edema or consolidation. Heart size and pulmonary vascular normal. No adenopathy. There is aortic atherosclerosis. Supine and upright abdomen: There is somewhat generalized bowel dilatation. No appreciable air-fluid levels. No free air. There is extensive degenerative change in the lumbar spine with lumbar dextroscoliosis. IMPRESSION: Somewhat generalized bowel dilatation without appreciable air-fluid levels. Suspect ileus or potential enteritis. No free air. No lung edema or consolidation. Aortic Atherosclerosis (ICD10-I70.0). Electronically Signed   By: Lowella Grip III M.D.   On: 11/06/2018 18:03    Assessment/Plan Principal Problem:   Wound infection Active Problems:   HTN (hypertension)   Abdominal pain   PVD (peripheral vascular disease) (HCC)   HLD (hyperlipidemia)   Postop wound infection -Recent left femoral to below the knee popliteal artery bypass graft done 2/11. -Erythema, significant induration, and minimal sanguinous drainage noted  at surgical suture sites.  Afebrile.  White count mildly elevated at 11.4.  Lactic acid normal. -ED physician discussed with Dr. Donnetta Hutching, vascular surgery will see the patient in the morning. -Continue vancomycin and Zosyn -Tylenol PRN pain -Blood culture x2 -Continue to monitor CBC -  Left lower extremity Doppler to rule out DVT  Abdominal pain -Presenting with complaints of right upper quadrant abdominal pain and associated nausea for the past 2 to 3 months.  Reports loose stools for the past few days and is currently taking laxatives and Linzess.  No diarrhea. -Lipase and LFTs normal.  CT abdomen pelvis negative for acute finding. -Zofran PRN nausea -Tylenol PRN pain -Continue Linzess due to history of chronic constipation.  Hold laxatives at this time.  Peripheral vascular disease -Recent left femoral to below the knee popliteal artery bypass graft done 2/11. -Left lower extremity dorsalis pedis and posterior tibial pulse dopplerable. -Management of postop wound infection as above. -Continue aspirin and statin  Chronic anemia -Stable.  Hemoglobin 11.4, at baseline.  CAD -Stable.  No anginal symptoms.  Continue aspirin, statin.   Hypertension -Continue amlodipine  Hyperlipidemia -Continue Lipitor  Depression and anxiety -Continue home Cymbalta, Klonopin  GERD -Continue Pepcid PRN  DVT prophylaxis: Lovenox Code Status: Patient wishes to be full code. Family Communication: No family available. Disposition Plan: Anticipate discharge after clinical improvement. Consults called: Vascular surgery Admission status: It is my clinical opinion that admission to INPATIENT is reasonable and necessary in this 73 y.o. female . presenting with symptoms of abdominal pain and leg pain, concerning for postop wound infection . in the context of PMH including: Recent left femoral to below the knee popliteal artery bypass graft done 2/11.  Marland Kitchen with pertinent positives on physical exam  including: Signs of wound infection. Marland Kitchen and pertinent positives on radiographic and laboratory data including: Leukocytosis. . Workup and treatment include IV antibiotics, vascular surgery evaluation.   Given the aforementioned, the predictability of an adverse outcome is felt to be significant. I expect that the patient will require at least 2 midnights in the hospital to treat this condition.    Shela Leff MD [facility] Pager [phone]  If 7PM-7AM, please contact night-coverage [email or web address] Password [facility]  11/07/2018, 8:39 AM

## 2018-11-07 NOTE — Progress Notes (Signed)
[name] is a 73 y.o. female with medical history significant of bipolar disorder, AAA, CVA, CAD, hypertension, tobacco abuse, peripheral vascular disease with recent left femoral to below the knee popliteal artery bypass graft done 2/11 resenting with complaints of abdominal pain and left leg pain. Patient reports having intermittent, sharp right lower quadrant abdominal pain associated with nausea for the past 2 to 3 months.  No episodes of vomiting.  She takes laxatives and has noticed loose stools for the past 2 to 3 days.  She has been having pain in her left calf for the past 2 years.  Since her recent femoral-popliteal bypass surgery she has noticed swelling in her left lower leg.  She noticed erythema and little bit of blood drainage from her suture sites for the past 1 day.  Reports having chills.  No fevers.  Denies any history of blood clots.  11/07/2018: Patient seen and examined at bedside.  Reports her pain is well controlled currently.  Had venous duplex ultrasound done on left lower extremity showing no DVT however abscess or hematoma cannot be excluded.  Left lower extremity CT with no contrast pending.  Vascular surgery following.  Highly appreciated.  Please refer to H&P dictated by Dr. Marlowe [name] on 11/07/2018 for further details of the assessment and plan.

## 2018-11-07 NOTE — Telephone Encounter (Signed)
Don with [facility] is returning call and would like a call back to explain in detail.  CB# [phone]

## 2018-11-07 NOTE — Telephone Encounter (Signed)
Home Health verbal orders was given to [facility] with [facility]. Original orders were placed by Hospital.

## 2018-11-07 NOTE — Telephone Encounter (Signed)
Message left for [facility] with [facility].  Need to confirm what provider initiated home health orders as it does not appear from notes and Dr. Vonna Kotyk previous message that he had provided orders.  If another provider, f/u with them for add'l orders.  If there are any questions, please contact us back.

## 2018-11-07 NOTE — Plan of Care (Signed)
  Problem: Education: Goal: Knowledge of General Education information will improve Description Including pain rating scale, medication(s)/side effects and non-pharmacologic comfort measures Outcome: Progressing   Problem: Clinical Measurements: Goal: Ability to maintain clinical measurements within normal limits will improve Outcome: Progressing   Problem: Activity: Goal: Risk for activity intolerance will decrease Outcome: Progressing   Problem: Clinical Measurements: Goal: Will remain free from infection Outcome: Progressing   Problem: Pain Managment: Goal: General experience of comfort will improve Outcome: Progressing   Problem: Safety: Goal: Ability to remain free from injury will improve Outcome: Progressing

## 2018-11-07 NOTE — ED Provider Notes (Signed)
[facility] EMERGENCY DEPARTMENT Provider Note   CSN: [id number] Arrival date & time: 11/06/18  2005    History   Chief Complaint Chief Complaint  Patient presents with  . Leg Pain  . Abdominal Pain    HPI [name] is a 73 y.o. female.     Patient sent from PCPs office with a 2-day history of chills and nausea and diarrhea with abdominal pain.  She was sent for consideration of imaging of her abdomen.  Patient was having a postop follow-up after having a femoropopliteal bypass by Dr. Oneida Alar on February 11.  She states since then she is had increased pain, redness and swelling in the leg mostly in the calf.  There has been some drainage from her incisions.  She denies any fever.  She states she is still taking an unknown antibiotic since the surgery.  Her PCP also has some concern for possible DVT.  Patient is not anticoagulated.  She did have a history of appendicitis with abscess in October 2019.  She reports pain to the right side of her abdomen with palpation.  She had 2 episodes of diarrhea and nausea and chills but no documented fever.  No chest pain, shortness of breath or cough.  The history is provided by the patient and a relative.  Leg Pain  Associated symptoms: fatigue   Abdominal Pain  Associated symptoms: chills, diarrhea, fatigue and nausea   Associated symptoms: no cough, no dysuria, no hematuria, no shortness of breath and no vomiting     Past Medical History:  Diagnosis Date  . AAA (abdominal aortic aneurysm) (HCC)    3.1 cm 07/08/18, 3 year follow-up recommended; possible 3 cm AAA by aortogram 09/13/18  . Anemia    PMH  . Appendicitis with abscess    07/08/18, s/p perc drain; resolved 07/30/18 by CT  . Arthritis   . Bipolar disorder ([place])   . Cerebrovascular disease    intra and extracranial vascular dx per MRI 4/11, neurology rec strict CVRF control  . Colonic inertia   . Constipation    chronic;severe  . Coronary artery disease     . Depression   . Duodenitis   . EKG abnormalities    changes, stress test neg (false EKG changes)  . Gastritis   . GERD (gastroesophageal reflux disease)   . Hypertension   . Hypothyroid 01/16/2014  . Meningioma ([place]) 10/21/2013  . PAD (peripheral artery disease) ([place])   . Psoriasis    sees derm  . Stroke ([place])   . Wears dentures   . Wears glasses     Patient Active Problem List   Diagnosis Date Noted  . Postoperative pain   . Sleep disturbance   . Tobacco abuse   . Acute blood loss anemia   . Hypoalbuminemia due to protein-calorie malnutrition ([place])   . Debility 10/24/2018  . Pre-operative cardiovascular examination 09/26/2018  . Dyslipidemia, goal LDL below 70 09/26/2018  . GAD (generalized anxiety disorder) 08/07/2018  . Major depressive disorder, single episode 08/07/2018  . Appendiceal abscess 07/08/2018  . PAD (peripheral artery disease) ([place]) 03/15/2018  . Atherosclerosis of native artery of left lower extremity with intermittent claudication ([place]) 03/15/2018  . Chronic left-sided low back pain with left-sided sciatica 12/25/2017  . Facial droop   . History of CVA (cerebrovascular accident) 10/23/2017  . Critical lower limb ischemia 11/08/2016  . Smoker 11/08/2016  . 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 Cigarette smoker 12/24/2015  .  Hypothyroid ? 01/16/2014  . Mild cognitive impairment 01/16/2014  . Meningioma ([place]) 10/21/2013  . Chest pain 10/20/2013  . Medicare annual wellness visit, subsequent 06/16/2013  . Dizziness and giddiness 06/16/2013  . RLS (restless legs syndrome) 04/29/2012  . Abdominal pain 12/27/2010  . DEGENERATIVE JOINT DISEASE 09/23/2010  . CAROTID ARTERY DISEASE 08/15/2010  . CHEST PAIN 08/15/2010  . HEADACHE 12/02/2009  . BACK PAIN 11/22/2009  . CONSTIPATION, CHRONIC 01/29/2008  . NAUSEA 01/28/2008  . DEPRESSION 03/08/2007  . Essential hypertension 03/08/2007    Past Surgical History:  Procedure Laterality Date   . ABDOMINAL AORTOGRAM W/LOWER EXTREMITY N/A 09/13/2018   Procedure: ABDOMINAL AORTOGRAM W/LOWER EXTREMITY;  Surgeon: Elam Dutch, MD;  Location: [facility];  Service: Cardiovascular;  Laterality: N/A;  . arthroscopy  04/2010   Right knee  . CATARACT EXTRACTION W/ INTRAOCULAR LENS  IMPLANT, BILATERAL    . COLONOSCOPY    . FEMORAL-POPLITEAL BYPASS GRAFT  10/22/2018  . FEMORAL-POPLITEAL BYPASS GRAFT Left 10/22/2018   Procedure: LEFT FEMORAL TO BELOW THE KNEE POPLITEAL ARTERY BYPASS GRAFT;  Surgeon: Elam Dutch, MD;  Location: [facility];  Service: Vascular;  Laterality: Left;  . IR RADIOLOGIST EVAL & MGMT  07/30/2018  . MULTIPLE TOOTH EXTRACTIONS    . TUBAL LIGATION       OB History   No obstetric history on file.      Home Medications    Prior to Admission medications   Medication Sig Start Date End Date Taking? Authorizing Provider  amLODipine (NORVASC) 10 MG tablet Take 1 tablet (10 mg total) by mouth daily. 10/28/18   Angiulli, [name], PA-C  aspirin EC 81 MG tablet Take 81 mg by mouth daily.    [provider]  atorvastatin (LIPITOR) 40 MG tablet Take 1 tablet (40 mg total) by mouth daily. 10/28/18 01/26/19  Angiulli, [name], PA-C  cephALEXin (KEFLEX) 500 MG capsule Take 1 capsule (500 mg total) by mouth every 6 (six) hours. 10/28/18   Angiulli, [name], PA-C  clonazePAM (KLONOPIN) 0.5 MG tablet Take 0.5mg  tab every bedtime and 1 tab PRN for anxiety 10/28/18   Angiulli, [name], PA-C  DULoxetine (CYMBALTA) 30 MG capsule Take 30 mg by mouth every evening.     [provider]  DULoxetine (CYMBALTA) 60 MG capsule Take 60 mg by mouth daily.     [provider]  famotidine (PEPCID) 20 MG tablet One at bedtime Patient taking differently: Take 20 mg by mouth daily as needed (nausea). One at bedtime 02/14/16   Tanda Rockers, MD  lamoTRIgine (LAMICTAL) 150 MG tablet Take 1 tablet (150 mg total) by mouth 2 (two) times daily. Patient taking  differently: Take 150 mg by mouth daily.  09/09/18   Cottle, Billey Co., MD  linaclotide [facility]) 145 MCG CAPS capsule Take 1 capsule (145 mcg total) by mouth daily before breakfast. 10/28/18   Angiulli, [name], PA-C  losartan (COZAAR) 50 MG tablet Take 1 tablet (50 mg total) by mouth daily. 10/25/17   Rai, Vernelle Emerald, MD  oxyCODONE-acetaminophen (PERCOCET/ROXICET) 5-325 MG tablet Take 1-2 tablets by mouth every 4 (four) hours as needed for moderate pain. 10/28/18   Angiulli, [name], PA-C  polyethylene glycol powder (GLYCOLAX/MIRALAX) powder Take 17 g by mouth every other day.    [provider]  rOPINIRole (REQUIP) 2 MG tablet TAKE 1 TABLET BY MOUTH AT BEDTIME Patient taking differently: Take 2 mg by mouth at bedtime.  05/27/12  Rise Mu, PA-C    Family History Family History  Problem Relation Age of Onset  . Breast cancer Mother        metastisis to bones  . Diabetes Son   . Heart disease Other        grandfather   . Alcohol abuse Brother   . Heart disease Brother   . Heart disease Maternal Aunt   . Lung cancer Brother        smoked  . Colon cancer Neg Hx     Social History Social History   Tobacco Use  . Smoking status: Current Every Day Smoker    Packs/day: 1.00    Years: 60.00    Pack years: 60.00    Types: Cigarettes  . Smokeless tobacco: Never Used  Substance Use Topics  . Alcohol use: Yes    Alcohol/week: 0.0 standard drinks    Comment: yes on occassion  . Drug use: No     Allergies   Patient has no known allergies.   Review of Systems Review of Systems  Constitutional: Positive for activity change, appetite change, chills and fatigue.  Respiratory: Negative for cough, chest tightness and shortness of breath.   Gastrointestinal: Positive for abdominal pain, diarrhea and nausea. Negative for vomiting.  Genitourinary: Negative for dysuria, flank pain, hematuria and urgency.  Musculoskeletal: Negative for arthralgias and myalgias.  Skin:  Positive for wound.  Neurological: Positive for weakness. Negative for dizziness and headaches.    all other systems are negative except as noted in the HPI and PMH.    Physical Exam Updated Vital Signs BP (!) 152/87 (BP Location: Right Arm)   Pulse 73   Temp 97.8 F (36.6 C) (Oral)   Resp 16   Ht 5\' 8"  (1.727 m)   Wt 81.8 kg   SpO2 97%   BMI 27.42 kg/m   Physical Exam Vitals signs and nursing note reviewed.  Constitutional:      General: She is not in acute distress.    Appearance: She is well-developed.  HENT:     Head: Normocephalic and atraumatic.     Mouth/Throat:     Pharynx: No oropharyngeal exudate.  Eyes:     Conjunctiva/sclera: Conjunctivae normal.     Pupils: Pupils are equal, round, and reactive to light.  Neck:     Musculoskeletal: Normal range of motion and neck supple.     Comments: No meningismus. Cardiovascular:     Rate and Rhythm: Normal rate and regular rhythm.     Heart sounds: Normal heart sounds. No murmur.  Pulmonary:     Effort: Pulmonary effort is normal. No respiratory distress.     Breath sounds: Normal breath sounds.  Abdominal:     Palpations: Abdomen is soft.     Tenderness: There is abdominal tenderness. There is no guarding or rebound.     Comments: Abdomen is soft, there is tenderness to the right lower quadrant and suprapubic without voluntary guarding or rebound.  Musculoskeletal: Normal range of motion.        General: Swelling and tenderness present.     Left lower leg: Edema present.     Comments: Diffuse swelling of left lower extremity.  Intact DP and PT pulse with Doppler. Compartments are soft.  Surgical incisions with surrounding erythema and induration without obvious drainage.  Skin:    General: Skin is warm.  Neurological:     Mental Status: She is alert and oriented to person, place, and time.  Cranial Nerves: No cranial nerve deficit.     Motor: No abnormal muscle tone.     Coordination: Coordination normal.      Comments: No ataxia on finger to nose bilaterally. No pronator drift. 5/5 strength throughout. CN 2-12 intact.Equal grip strength. Sensation intact.   Psychiatric:        Behavior: Behavior normal.          ED Treatments / Results  Labs (all labs ordered are listed, but only abnormal results are displayed) Labs Reviewed  URINALYSIS, ROUTINE W REFLEX MICROSCOPIC - Abnormal; Notable for the following components:      Result Value   Hgb urine dipstick SMALL (*)    Bacteria, UA RARE (*)    All other components within normal limits  CBC WITH DIFFERENTIAL/PLATELET - Abnormal; Notable for the following components:   WBC 11.4 (*)    Hemoglobin 11.4 (*)    Platelets 517 (*)    Abs Immature Granulocytes 0.08 (*)    All other components within normal limits  COMPREHENSIVE METABOLIC PANEL - Abnormal; Notable for the following components:   Sodium 131 (*)    Chloride 95 (*)    Alkaline Phosphatase 140 (*)    All other components within normal limits  CULTURE, BLOOD (ROUTINE X 2)  CULTURE, BLOOD (ROUTINE X 2)  GASTROINTESTINAL PANEL BY PCR, STOOL (REPLACES STOOL CULTURE)  LIPASE, BLOOD  LACTIC ACID, PLASMA  URINALYSIS, ROUTINE W REFLEX MICROSCOPIC    EKG None  Radiology Dg Chest 1 View  Result Date: 11/06/2018 CLINICAL DATA:  Cough EXAM: CHEST  1 VIEW COMPARISON:  Abdomen series November 06, 2018; frontal and lateral chest radiographs June 28, 2018 FINDINGS: Lateral chest radiograph obtained. No edema or consolidation seen on lateral view. Heart size within normal limits. There is aortic atherosclerosis. There is degenerative change in the thoracic spine. IMPRESSION: No evident edema or consolidation. Heart size within normal limits. Aortic Atherosclerosis (ICD10-I70.0). Electronically Signed   By: Lowella Grip III M.D.   On: 11/06/2018 18:02   Dg Chest 2 View  Result Date: 11/06/2018 CLINICAL DATA:  Leukocytosis EXAM: CHEST - 2 VIEW COMPARISON:  07/07/2019 FINDINGS:  Hyperinflation of the lungs. Diffuse interstitial prominence throughout the lungs is similar to prior study and likely reflects chronic interstitial lung disease. No acute confluent airspace opacities or effusions. Aortic atherosclerosis. No acute bony abnormality. IMPRESSION: COPD.  Chronic interstitial changes.  No active disease. Electronically Signed   By: Rolm Baptise M.D.   On: 11/06/2018 21:15   Ct Abdomen Pelvis W Contrast  Result Date: 11/07/2018 CLINICAL DATA:  Acute generalized abdominal pain and fever. Right lower quadrant pain on palpation. Left posterior leg pain. Patient had a femoral popliteal bypass on 02/13. EXAM: CT ABDOMEN AND PELVIS WITH CONTRAST TECHNIQUE: Multidetector CT imaging of the abdomen and pelvis was performed using the standard protocol following bolus administration of intravenous contrast. CONTRAST:  124mL OMNIPAQUE IOHEXOL 300 MG/ML  SOLN COMPARISON:  07/30/2018 FINDINGS: Lower chest: Fibrosis in the lung bases. Hepatobiliary: No focal liver abnormality is seen. No gallstones, gallbladder wall thickening, or biliary dilatation. Pancreas: Unremarkable. No pancreatic ductal dilatation or surrounding inflammatory changes. Spleen: Normal in size without focal abnormality. Adrenals/Urinary Tract: No adrenal gland nodules. Small bilateral renal parenchymal cysts. No hydronephrosis or hydroureter. Bladder is unremarkable. Stomach/Bowel: Stomach, small bowel, and colon are not abnormally distended. No wall thickening or inflammatory changes are seen. Appendix is not identified. Vascular/Lymphatic: Aortic atherosclerosis. No enlarged abdominal or pelvic lymph nodes. Reproductive: Uterus  and bilateral adnexa are unremarkable. Other: No free air or free fluid in the abdomen. Abdominal wall musculature appears intact. Infiltration in the subcutaneous fat over the left groin region likely postoperative. No abscess. Musculoskeletal: Degenerative changes in the spine. No destructive bone  lesions. IMPRESSION: No acute process demonstrated in the abdomen or pelvis. No evidence of bowel obstruction or inflammation. Fibrosis in the lung bases. Electronically Signed   By: Lucienne Capers M.D.   On: 11/07/2018 03:04   Dg Abd Acute 2+v W 1v Chest  Result Date: 11/06/2018 CLINICAL DATA:  Abdominal pain and nausea with cough EXAM: DG ABDOMEN ACUTE W/ 1V CHEST COMPARISON:  Chest radiograph July 08, 2018; CT abdomen and pelvis July 30, 2018 FINDINGS: PA chest: No edema or consolidation. Heart size and pulmonary vascular normal. No adenopathy. There is aortic atherosclerosis. Supine and upright abdomen: There is somewhat generalized bowel dilatation. No appreciable air-fluid levels. No free air. There is extensive degenerative change in the lumbar spine with lumbar dextroscoliosis. IMPRESSION: Somewhat generalized bowel dilatation without appreciable air-fluid levels. Suspect ileus or potential enteritis. No free air. No lung edema or consolidation. Aortic Atherosclerosis (ICD10-I70.0). Electronically Signed   By: Lowella Grip III M.D.   On: 11/06/2018 18:03    Procedures Procedures (including critical care time)  Medications Ordered in ED Medications  vancomycin (VANCOCIN) IVPB 1000 mg/200 mL premix (has no administration in time range)  piperacillin-tazobactam (ZOSYN) IVPB 3.375 g (has no administration in time range)     Initial Impression / Assessment and Plan / ED Course  I have reviewed the triage vital signs and the nursing notes.  Pertinent labs & imaging results that were available during my care of the patient were reviewed by me and considered in my medical decision making (see chart for details).       Patient from PCPs office with a 2-day history of nausea, abdominal pain, subjective fever, chills with worsening left leg pain after femoropopliteal bypass on February 11.  We will proceed with infectious work-up.  Patient does have erythema and induration along  her surgical incisions of her left leg.  The leg is diffusely swollen.  The pulses are dopplerable and intact.  Abdomen is soft with mild right-sided lower abdominal tenderness.  Patient does have a history of appendicitis with abscess requiring drain in October.  Infectious work-up shows leukocytosis.  Urinalysis and chest x-ray are negative. CT abdomen shows no intra-abdominal infection.  Suspect she has postoperative wound infection causing her malaise and chills.  She was given broad-spectrum antibiotics after cultures were obtained.  Discussed with Dr. Donnetta Hutching of vascular surgery who will see patient in the morning.  Agrees with medical admission as well as DVT study.  BP and mental status remain stable in the ED.  Admission d/w Dr. Marlowe Sax.   Final Clinical Impressions(s) / ED Diagnoses   Final diagnoses:  None    ED Discharge Orders    None       Yamaira Spinner, Annie Main, MD 11/07/18 [phone]

## 2018-11-07 NOTE — Progress Notes (Addendum)
Pharmacy Antibiotic Note  [name] is a 73 y.o. female admitted on 11/06/2018 with wound infection.  Pharmacy has been consulted for Vancomycin/Zosyn dosing. WBC 11.4. Renal function good.   Plan: Vancomycin 1250 mg IV q24h >>Estimated AUC: 468 Zosyn 3.375G IV q8h to be infused over 4 hours Trend WBC, temp, renal function  F/U infectious work-up Drug levels as indicated   Wt: 163 lbs  Temp (24hrs), Avg:97.8 F (36.6 C), Min:97.3 F (36.3 C), Max:98.4 F (36.9 C)  Recent Labs  Lab 11/06/18 1745 11/06/18 2031  WBC 12.5* 11.4*  CREATININE  --  0.89  LATICACIDVEN  --  1.3    Estimated Creatinine Clearance: 61.3 mL/min (by C-G formula based on SCr of 0.89 mg/dL).    No Known Allergies    Narda Bonds 11/07/2018 5:29 AM

## 2018-11-07 NOTE — Consult Note (Signed)
[facility]  Subjective  - left leg hurts   Objective (!) 154/73 78 98.1 F (36.7 C) (Oral) 18 94% No intake or output data in the 24 hours ending 11/07/18 0859  Mild induration left mid leg no real drainage Some maceration of skin No real fluctuance Left foot pink warm edematous  Assessment/Planning: Left leg with pain some swelling no real pointing signs of abscess Very mild leukocytosis Will get CT Scan of left leg to look for underlying deep hematoma or abscess  Otherwise IV antibiotics for now Will follow with you  Ruta Hinds 11/07/2018 8:59 AM --  Laboratory Lab Results: Recent Labs    11/06/18 1745 11/06/18 2031  WBC 12.5* 11.4*  HGB 11.9 11.4*  HCT 36.3 36.2  PLT  --  517*   BMET Recent Labs    11/06/18 2031  NA 131*  K 3.6  CL 95*  CO2 25  GLUCOSE 98  BUN 16  CREATININE 0.89  CALCIUM 9.5    COAG Lab Results  Component Value Date   INR 1.01 10/21/2018   INR 1.15 07/09/2018   INR 1.01 10/23/2017   No results found for: PTT

## 2018-11-08 ENCOUNTER — Telehealth: Payer: Self-pay | Admitting: Family Medicine

## 2018-11-08 ENCOUNTER — Encounter (HOSPITAL_COMMUNITY)
Admission: EM | Disposition: A | Discharge status: Home Health | Payer: Self-pay | Source: Home / Self Care | Attending: Internal Medicine

## 2018-11-08 ENCOUNTER — Encounter (HOSPITAL_COMMUNITY): Payer: Self-pay

## 2018-11-08 ENCOUNTER — Inpatient Hospital Stay (HOSPITAL_COMMUNITY): Payer: Medicare Other | Admitting: Certified Registered"

## 2018-11-08 DIAGNOSIS — I9789 Other postprocedural complications and disorders of the circulatory system, not elsewhere classified: Secondary | ICD-10-CM

## 2018-11-08 HISTORY — PX: I&D EXTREMITY: SHX5045

## 2018-11-08 LAB — CBC
HCT: 31.8 % — ABNORMAL LOW (ref 36.0–46.0)
HEMOGLOBIN: 9.9 g/dL — AB (ref 12.0–15.0)
MCH: 28.5 pg (ref 26.0–34.0)
MCHC: 31.1 g/dL (ref 30.0–36.0)
MCV: 91.6 fL (ref 80.0–100.0)
Platelets: 380 10*3/uL (ref 150–400)
RBC: 3.47 MIL/uL — ABNORMAL LOW (ref 3.87–5.11)
RDW: 13.9 % (ref 11.5–15.5)
WBC: 6.5 10*3/uL (ref 4.0–10.5)
nRBC: 0 % (ref 0.0–0.2)

## 2018-11-08 SURGERY — IRRIGATION AND DEBRIDEMENT EXTREMITY
Anesthesia: General | Laterality: Left

## 2018-11-08 MED ORDER — ONDANSETRON HCL 4 MG/2ML IJ SOLN
INTRAMUSCULAR | Status: DC | PRN
  Administered 2018-11-08: 4 mg via INTRAVENOUS

## 2018-11-08 MED ORDER — LIDOCAINE HCL (PF) 1 % IJ SOLN
INTRAMUSCULAR | Status: AC
  Filled 2018-11-08: qty 30

## 2018-11-08 MED ORDER — LIDOCAINE 2% (20 MG/ML) 5 ML SYRINGE
INTRAMUSCULAR | Status: DC | PRN
  Administered 2018-11-08: 100 mg via INTRAVENOUS

## 2018-11-08 MED ORDER — FENTANYL CITRATE (PF) 100 MCG/2ML IJ SOLN
25.0000 ug | INTRAMUSCULAR | Status: DC | PRN
  Administered 2018-11-08: 25 ug via INTRAVENOUS

## 2018-11-08 MED ORDER — LIDOCAINE HCL (PF) 1 % IJ SOLN
INTRAMUSCULAR | Status: AC
  Filled 2018-11-08: qty 5

## 2018-11-08 MED ORDER — PROPOFOL 10 MG/ML IV BOLUS
INTRAVENOUS | Status: DC | PRN
  Administered 2018-11-08: 150 mg via INTRAVENOUS

## 2018-11-08 MED ORDER — MEPERIDINE HCL 50 MG/ML IJ SOLN: 6.2500 mg | INTRAMUSCULAR | Status: DC | PRN

## 2018-11-08 MED ORDER — METOCLOPRAMIDE HCL 5 MG/ML IJ SOLN: 10.0000 mg | Freq: Once | INTRAMUSCULAR | Status: DC | PRN

## 2018-11-08 MED ORDER — PHENYLEPHRINE 40 MCG/ML (10ML) SYRINGE FOR IV PUSH (FOR BLOOD PRESSURE SUPPORT)
PREFILLED_SYRINGE | INTRAVENOUS | Status: DC | PRN
  Administered 2018-11-08: 80 ug via INTRAVENOUS
  Administered 2018-11-08: 120 ug via INTRAVENOUS

## 2018-11-08 MED ORDER — FENTANYL CITRATE (PF) 250 MCG/5ML IJ SOLN
INTRAMUSCULAR | Status: AC
  Filled 2018-11-08: qty 5

## 2018-11-08 MED ORDER — FENTANYL CITRATE (PF) 100 MCG/2ML IJ SOLN
INTRAMUSCULAR | Status: AC
  Filled 2018-11-08: qty 2

## 2018-11-08 MED ORDER — FENTANYL CITRATE (PF) 250 MCG/5ML IJ SOLN
INTRAMUSCULAR | Status: DC | PRN
  Administered 2018-11-08 (×2): 50 ug via INTRAVENOUS

## 2018-11-08 MED ORDER — PROPOFOL 10 MG/ML IV BOLUS
INTRAVENOUS | Status: AC
  Filled 2018-11-08: qty 20

## 2018-11-08 MED ORDER — 0.9 % SODIUM CHLORIDE (POUR BTL) OPTIME
TOPICAL | Status: DC | PRN
  Administered 2018-11-08: 1000 mL

## 2018-11-08 MED ORDER — LACTATED RINGERS IV SOLN
INTRAVENOUS | Status: DC | PRN
  Administered 2018-11-08: 14:00:00 via INTRAVENOUS

## 2018-11-08 SURGICAL SUPPLY — 28 items
BANDAGE ACE 4X5 VEL STRL LF (GAUZE/BANDAGES/DRESSINGS) IMPLANT
BANDAGE ACE 6X5 VEL STRL LF (GAUZE/BANDAGES/DRESSINGS) IMPLANT
BANDAGE ELASTIC 6 VELCRO ST LF (GAUZE/BANDAGES/DRESSINGS) ×2 IMPLANT
BNDG GAUZE ELAST 4 BULKY (GAUZE/BANDAGES/DRESSINGS) ×2 IMPLANT
CANISTER SUCT 3000ML PPV (MISCELLANEOUS) ×2 IMPLANT
COVER SURGICAL LIGHT HANDLE (MISCELLANEOUS) ×2 IMPLANT
COVER WAND RF STERILE (DRAPES) ×2 IMPLANT
DRAPE ORTHO SPLIT 77X108 STRL (DRAPES) ×2
DRAPE SURG ORHT 6 SPLT 77X108 (DRAPES) ×2 IMPLANT
ELECT REM PT RETURN 9FT ADLT (ELECTROSURGICAL) ×2
ELECTRODE REM PT RTRN 9FT ADLT (ELECTROSURGICAL) ×1 IMPLANT
GAUZE SPONGE 4X4 12PLY STRL (GAUZE/BANDAGES/DRESSINGS) ×4 IMPLANT
GLOVE BIO SURGEON STRL SZ7.5 (GLOVE) ×2 IMPLANT
GOWN STRL REUS W/ TWL LRG LVL3 (GOWN DISPOSABLE) ×3 IMPLANT
GOWN STRL REUS W/TWL LRG LVL3 (GOWN DISPOSABLE) ×3
KIT BASIN OR (CUSTOM PROCEDURE TRAY) ×2 IMPLANT
KIT TURNOVER KIT B (KITS) ×2 IMPLANT
NS IRRIG 1000ML POUR BTL (IV SOLUTION) ×2 IMPLANT
PACK GENERAL/GYN (CUSTOM PROCEDURE TRAY) ×2 IMPLANT
PAD ARMBOARD 7.5X6 YLW CONV (MISCELLANEOUS) ×4 IMPLANT
STAPLER VISISTAT 35W (STAPLE) IMPLANT
SUT ETHILON 3 0 PS 1 (SUTURE) IMPLANT
SUT VIC AB 2-0 CTX 36 (SUTURE) IMPLANT
SUT VIC AB 3-0 SH 27 (SUTURE)
SUT VIC AB 3-0 SH 27X BRD (SUTURE) IMPLANT
SUT VICRYL 4-0 PS2 18IN ABS (SUTURE) IMPLANT
TOWEL GREEN STERILE (TOWEL DISPOSABLE) ×2 IMPLANT
WATER STERILE IRR 1000ML POUR (IV SOLUTION) ×2 IMPLANT

## 2018-11-08 NOTE — Op Note (Signed)
Procedure: Incise and drain left thigh  Preoperative diagnosis: Left thigh wound infection  Postoperative diagnosis: Same  Anesthesia: General  Assistant: Nurse  Operative findings seroma and old hematoma type fluid sent for culture  Operative details: After team informed consent, the patient taken the operating.  The patient placed supine position operating table.  After induction of general anesthesia patient's entire left thigh was prepped and draped in usual sterile fashion.  2 saphenectomy incisions on the medial aspect of the thigh were reopened bluntly and the sutures taken down.  A fluid pocket was entered which had clear and old hematoma looking fluid.  There was no obvious purulent material.  Cultures of each wound were taken.  The wound was thoroughly irrigated with normal saline solution.  The wounds were then packed with saline moistened Kerlix.  A dry sterile dressing was applied.  The patient tolerated procedure well and there were no complications.  The sponge and needle counts correct in the case.  The patient was taken to recovery in stable condition.  Ruta Hinds, MD [facility] Office: [phone] Pager: [phone]

## 2018-11-08 NOTE — Anesthesia Preprocedure Evaluation (Signed)
Anesthesia Evaluation  Patient identified by MRN, date of birth, ID band Patient awake    Reviewed: Allergy & Precautions, NPO status , Patient's Chart, lab work & pertinent test results  Airway Mallampati: II  TM Distance: >3 FB Neck ROM: Full    Dental no notable dental hx.    Pulmonary Current Smoker,    Pulmonary exam normal breath sounds clear to auscultation       Cardiovascular hypertension, Pt. on medications Normal cardiovascular exam Rhythm:Regular Rate:Normal     Neuro/Psych CVA, No Residual Symptoms negative psych ROS   GI/Hepatic negative GI ROS, Neg liver ROS,   Endo/Other  negative endocrine ROS  Renal/GU negative Renal ROS  negative genitourinary   Musculoskeletal negative musculoskeletal ROS (+)   Abdominal   Peds negative pediatric ROS (+)  Hematology negative hematology ROS (+)   Anesthesia Other Findings   Reproductive/Obstetrics negative OB ROS                             Anesthesia Physical Anesthesia Plan  ASA: III  Anesthesia Plan: General   Post-op Pain Management:    Induction: Intravenous  PONV Risk Score and Plan: 2 and Ondansetron and Treatment may vary due to age or medical condition  Airway Management Planned: LMA  Additional Equipment:   Intra-op Plan:   Post-operative Plan: Extubation in OR  Informed Consent: I have reviewed the patients History and Physical, chart, labs and discussed the procedure including the risks, benefits and alternatives for the proposed anesthesia with the patient or authorized representative who has indicated his/her understanding and acceptance.     Dental advisory given  Plan Discussed with: CRNA  Anesthesia Plan Comments:         Anesthesia Quick Evaluation

## 2018-11-08 NOTE — Progress Notes (Signed)
[facility]  Subjective  - leg still hurts   Objective (!) 155/66 62 98.4 F (36.9 C) (Oral) 16 97%  Intake/Output Summary (Last 24 hours) at 11/08/2018 0845 Last data filed at 11/07/2018 1653 Gross per 24 hour  Intake 64.58 ml  Output -  Net 64.58 ml   Left leg no real drainage or erythema some fluctuance Foot pink warm  Assessment/Planning: Leukocytosis resolved still complains of pain Subcutaneous fluid collections on CT.  Will open wounds at bedside later today.  Pt wants local anesthesia so will order and do later today.  She can go home from my standpoint after I and D.  Will need home health wound care  Ruta Hinds 11/08/2018 8:45 AM --  Laboratory Lab Results: Recent Labs    11/06/18 2031 11/08/18 0316  WBC 11.4* 6.5  HGB 11.4* 9.9*  HCT 36.2 31.8*  PLT 517* 380   BMET Recent Labs    11/06/18 2031  NA 131*  K 3.6  CL 95*  CO2 25  GLUCOSE 98  BUN 16  CREATININE 0.89  CALCIUM 9.5    COAG Lab Results  Component Value Date   INR 1.01 10/21/2018   INR 1.15 07/09/2018   INR 1.01 10/23/2017   No results found for: PTT

## 2018-11-08 NOTE — Plan of Care (Signed)
  Problem: Clinical Measurements: Goal: Ability to maintain clinical measurements within normal limits will improve Outcome: Progressing Goal: Will remain free from infection Outcome: Progressing   Problem: Activity: Goal: Risk for activity intolerance will decrease Outcome: Progressing   Problem: Pain Managment: Goal: General experience of comfort will improve Outcome: Progressing   Problem: Safety: Goal: Ability to remain free from injury will improve Outcome: Progressing   Problem: Skin Integrity: Goal: Risk for impaired skin integrity will decrease Outcome: Progressing   

## 2018-11-08 NOTE — Plan of Care (Signed)

## 2018-11-08 NOTE — Progress Notes (Signed)
Pt adamantly refusing I and D at the bedside with local anesthesia.  She tells me she last ate at 730 am.  Will see if we can find OR time to do I and D with sedation and MAC.  [place], MD [facility] Office: [phone] Pager: [phone]

## 2018-11-08 NOTE — Telephone Encounter (Signed)
Copied from [place] [id number]. Topic: Quick Communication - See Telephone Encounter >> Nov 08, 2018 11:07 AM Bea Graff, NT wrote: CRM for notification. See Telephone encounter for: 11/08/18. Pt would like to speak to Dr. Carlota Raspberry regarding she is in the hospital right now and they want to do a procedure, but she would like his advice.

## 2018-11-08 NOTE — Progress Notes (Signed)
PROGRESS NOTE  [name] TML:[id number] DOB: [birth year] DOA: 11/06/2018 PCP: [name], MD  HPI/Recap of past 24 hours: [name] a 73 y.o.femalewith medical history significant ofbipolar disorder, AAA, CVA, CAD, hypertension, tobacco abuse, peripheral vascular disease with recent left femoral to below the knee popliteal artery bypass graft done 2/11resenting with complaints of abdominal pain and left leg pain. Patient reports having intermittent, sharp right lower quadrant abdominal pain associated with nausea for the past 2 to 3 months.No episodes of vomiting. She takes laxatives and has noticed loose stools for the past 2 to 3 days. She has been having pain in her left calf for the past 2 years. Since her recent femoral-popliteal bypass surgery she has noticed swelling in her left lower leg. She noticed erythema and little bit of blood drainage from her suture sitesfor the past 1 day. Reports having chills. No fevers. Denies any history of blood clots.  11/07/2018: Patient seen and examined at bedside.  Reports her pain is well controlled currently.  Had venous duplex ultrasound done on left lower extremity showing no DVT however abscess or hematoma cannot be excluded.  Left lower extremity CT with no contrast pending.  Vascular surgery following.  Highly appreciated.  11/08/18: intermittent pain in her left leg, worse when she moves. Plan for I&D by vas surgery today.  Assessment/Plan: Principal Problem:   Wound infection Active Problems:   HTN (hypertension)   Abdominal pain   PVD (peripheral vascular disease) (HCC)   HLD (hyperlipidemia)  Left thigh wound infection, I&D planned on 11/08/2018 by vascular surgery -Recent left femoral to below the knee popliteal artery bypass graft done 2/11. -Erythema, significant induration, and minimal sanguinous drainage noted at surgical suture sites.  Afebrile.  White count mildly elevated at 11.4.  Lactic acid  normal. -Vasc Surgery following.  Plan for I&D today 11/08/2018 -Continue vancomycin and Zosyn -Tylenol PRN pain -Blood culture x2 -Continue to monitor CBC -Left lower extremity Doppler ruled out DVT  Abdominal pain -Presenting with complaints of right upper quadrant abdominal pain and associated nausea for the past 2 to 3 months.  Reports loose stools for the past few days and is currently taking laxatives and Linzess.  No diarrhea. -Lipase and LFTs normal.  CT abdomen pelvis negative for acute finding. -Zofran PRN nausea -Tylenol PRN pain -Continue Linzess due to history of chronic constipation.  Hold laxatives at this time.  Peripheral vascular disease -Recent left femoral to below the knee popliteal artery bypass graft done 2/11. -Left lower extremity dorsalis pedis and posterior tibial pulse dopplerable. -Management of postop wound infection as above. -Continue aspirin and statin  Chronic anemia -Stable.  Hemoglobin 11.4, at baseline.  CAD -Stable.  No anginal symptoms.  Continue aspirin, statin.   Hypertension -Continue amlodipine  Hyperlipidemia -Continue Lipitor  Depression and anxiety -Continue home Cymbalta, Klonopin  GERD -Continue Pepcid PRN  DVT prophylaxis: Lovenox Code Status: Patient wishes to be full code. Family Communication: No family available. Disposition Plan: Anticipate discharge after clinical improvement. Consults called: Vascular surgery   Objective: Vitals:   11/08/18 1500 11/08/18 1515 11/08/18 1530 11/08/18 1601  BP: 139/67 140/71 (!) 146/68 139/67  Pulse: 64 66 62 66  Resp: 11 17 15 15   Temp: (!) 97.5 F (36.4 C) 97.7 F (36.5 C)  98.9 F (37.2 C)  TempSrc:    Oral  SpO2: 100% 99% 93% 98%  Weight:      Height:        Intake/Output  Summary (Last 24 hours) at 11/08/2018 1828 Last data filed at 11/08/2018 1800 Gross per 24 hour  Intake 127.25 ml  Output 5 ml  Net 122.25 ml   Filed Weights   11/06/18 2017 11/07/18  0531  Weight: 81.8 kg 73.9 kg    Exam:  . General: 73 y.o. year-old female well developed well nourished in no acute distress.  Alert and oriented x3. . Cardiovascular: Regular rate and rhythm with no rubs or gallops.  No thyromegaly or JVD noted.   Marland Kitchen Respiratory: Clear to auscultation with no wheezes or rales. Good inspiratory effort. . Abdomen: Soft nontender nondistended with normal bowel sounds x4 quadrants. . Musculoskeletal: No lower extremity edema. 2/4 pulses in all 4 extremities.  Lower extremities scar and tenderness  . Psychiatry: Mood is appropriate for condition and setting   Data Reviewed: CBC: Recent Labs  Lab 11/06/18 1745 11/06/18 2031 11/08/18 0316  WBC 12.5* 11.4* 6.5  NEUTROABS  --  7.2  --   HGB 11.9 11.4* 9.9*  HCT 36.3 36.2 31.8*  MCV 86.7 92.1 91.6  PLT  --  517* 425   Basic Metabolic Panel: Recent Labs  Lab 11/06/18 2031  NA 131*  K 3.6  CL 95*  CO2 25  GLUCOSE 98  BUN 16  CREATININE 0.89  CALCIUM 9.5   GFR: Estimated Creatinine Clearance: 55.1 mL/min (by C-G formula based on SCr of 0.89 mg/dL). Liver Function Tests: Recent Labs  Lab 11/06/18 2031  AST 23  ALT 26  ALKPHOS 140*  BILITOT 0.6  PROT 7.5  ALBUMIN 3.6   Recent Labs  Lab 11/06/18 2031  LIPASE 23   No results for input(s): AMMONIA in the last 168 hours. Coagulation Profile: No results for input(s): INR, PROTIME in the last 168 hours. Cardiac Enzymes: No results for input(s): CKTOTAL, CKMB, CKMBINDEX, TROPONINI in the last 168 hours. BNP (last 3 results) No results for input(s): PROBNP in the last 8760 hours. HbA1C: No results for input(s): HGBA1C in the last 72 hours. CBG: No results for input(s): GLUCAP in the last 168 hours. Lipid Profile: No results for input(s): CHOL, HDL, LDLCALC, TRIG, CHOLHDL, LDLDIRECT in the last 72 hours. Thyroid Function Tests: No results for input(s): TSH, T4TOTAL, FREET4, T3FREE, THYROIDAB in the last 72 hours. Anemia Panel: No  results for input(s): VITAMINB12, FOLATE, FERRITIN, TIBC, IRON, RETICCTPCT in the last 72 hours. Urine analysis:    Component Value Date/Time   COLORURINE YELLOW 11/06/2018 2040   APPEARANCEUR CLEAR 11/06/2018 2040   LABSPEC 1.006 11/06/2018 2040   PHURINE 6.0 11/06/2018 2040   GLUCOSEU NEGATIVE 11/06/2018 2040   HGBUR SMALL (A) 11/06/2018 2040   BILIRUBINUR NEGATIVE 11/06/2018 2040   BILIRUBINUR neg 07/31/2013 1752   KETONESUR NEGATIVE 11/06/2018 2040   PROTEINUR NEGATIVE 11/06/2018 2040   UROBILINOGEN 0.2 07/31/2013 1752   NITRITE NEGATIVE 11/06/2018 2040   LEUKOCYTESUR NEGATIVE 11/06/2018 2040   Sepsis Labs: @LABRCNTIP (procalcitonin:4,lacticidven:4)  ) Recent Results (from the past 240 hour(s))  Blood culture (routine x 2)     Status: None (Preliminary result)   Collection Time: 11/06/18  8:00 PM  Result Value Ref Range Status   Specimen Description BLOOD RIGHT ARM  Final   Special Requests   Final    BOTTLES DRAWN AEROBIC AND ANAEROBIC Blood Culture adequate volume Performed at [facility], [place] [address], [place], [place] [address]    Culture NO GROWTH 2 DAYS  Final   Report Status PENDING  Incomplete  Blood culture (  routine x 2)     Status: None (Preliminary result)   Collection Time: 11/06/18  8:25 PM  Result Value Ref Range Status   Specimen Description BLOOD LEFT ARM  Final   Special Requests   Final    BOTTLES DRAWN AEROBIC ONLY Blood Culture adequate volume Performed at [facility], [place] [address], [place], [place] [address]    Culture NO GROWTH 2 DAYS  Final   Report Status PENDING  Incomplete  Aerobic/Anaerobic Culture (surgical/deep wound)     Status: None (Preliminary result)   Collection Time: 11/08/18  2:38 PM  Result Value Ref Range Status   Specimen Description WOUND LEFT LEG  Final   Special Requests NONE  Final   Gram Stain   Final    NO WBC SEEN NO ORGANISMS SEEN Performed at [facility], [place] [address], [place],  [place] [address]    Culture PENDING  Incomplete   Report Status PENDING  Incomplete      Studies: No results found.  Scheduled Meds: . amLODipine  10 mg Oral Daily  . aspirin EC  81 mg Oral Daily  . atorvastatin  40 mg Oral Daily  . clonazePAM  0.5 mg Oral QHS  . DULoxetine  30 mg Oral QPM  . DULoxetine  60 mg Oral q morning - 10a  . enoxaparin (LOVENOX) injection  40 mg Subcutaneous Q24H  . fentaNYL      . lamoTRIgine  150 mg Oral Daily  . lidocaine (PF)      . lidocaine (PF)      . linaclotide  145 mcg Oral QAC breakfast  . rOPINIRole  2 mg Oral QHS    Continuous Infusions: . piperacillin-tazobactam (ZOSYN)  IV Stopped (11/08/18 0951)  . vancomycin 1,250 mg (11/07/18 2158)     LOS: 1 day     Kayleen Memos, MD [facility] Pager [phone]  If 7PM-7AM, please contact night-coverage [email or web address] Password [facility] 11/08/2018, 6:28 PM

## 2018-11-08 NOTE — Transfer of Care (Signed)
Immediate Anesthesia Transfer of Care Note  Patient: [name]  Procedure(s) Performed: IRRIGATION AND DEBRIDEMENT EXTREMITY Left Leg (Left )  Patient Location: PACU  Anesthesia Type:General  Level of Consciousness: awake and oriented  Airway & Oxygen Therapy: Patient Spontanous Breathing  Post-op Assessment: Report given to RN  Post vital signs: Reviewed and stable  Last Vitals:  Vitals Value Taken Time  BP    Temp    Pulse 65 11/08/2018  2:58 PM  Resp 9 11/08/2018  2:58 PM  SpO2 98 % 11/08/2018  2:58 PM  Vitals shown include unvalidated device data.  Last Pain:  Vitals:   11/08/18 1057  TempSrc:   PainSc: 0-No pain      Patients Stated Pain Goal: 3 (84/03/35 3317)  Complications: No apparent anesthesia complications

## 2018-11-08 NOTE — Anesthesia Postprocedure Evaluation (Signed)
Anesthesia Post Note  Patient: YOLA PARADISO  Procedure(s) Performed: IRRIGATION AND DEBRIDEMENT EXTREMITY Left Leg (Left )     Patient location during evaluation: PACU Anesthesia Type: General Level of consciousness: awake and alert Pain management: pain level controlled Vital Signs Assessment: post-procedure vital signs reviewed and stable Respiratory status: spontaneous breathing, nonlabored ventilation, respiratory function stable and patient connected to nasal cannula oxygen Cardiovascular status: blood pressure returned to baseline and stable Postop Assessment: no apparent nausea or vomiting Anesthetic complications: no    Last Vitals:  Vitals:   11/08/18 1500 11/08/18 1515  BP: 139/67 140/71  Pulse: 64 66  Resp: 11 17  Temp: (!) 36.4 C 36.5 C  SpO2: 100% 99%    Last Pain:  Vitals:   11/08/18 1510  TempSrc:   PainSc: 4                  Montez Hageman

## 2018-11-08 NOTE — Anesthesia Procedure Notes (Signed)
Procedure Name: LMA Insertion Date/Time: 11/08/2018 2:23 PM Performed by: Barrington Ellison, CRNA Pre-anesthesia Checklist: Patient identified, Emergency Drugs available, Suction available and Patient being monitored Patient Re-evaluated:Patient Re-evaluated prior to induction Oxygen Delivery Method: Circle System Utilized Preoxygenation: Pre-oxygenation with 100% oxygen Induction Type: IV induction Ventilation: Mask ventilation without difficulty LMA: LMA inserted LMA Size: 4.0 Number of attempts: 1 Placement Confirmation: positive ETCO2 Tube secured with: Tape Dental Injury: Teeth and Oropharynx as per pre-operative assessment

## 2018-11-09 ENCOUNTER — Encounter (HOSPITAL_COMMUNITY): Payer: Self-pay | Admitting: Vascular Surgery

## 2018-11-09 MED ORDER — OXYCODONE-ACETAMINOPHEN 5-325 MG PO TABS: 1.0000 | ORAL_TABLET | Freq: Two times a day (BID) | ORAL | 0 refills | Status: DC | PRN

## 2018-11-09 MED ORDER — SULFAMETHOXAZOLE-TRIMETHOPRIM 800-160 MG PO TABS: 1.0000 | ORAL_TABLET | Freq: Two times a day (BID) | ORAL | 0 refills | Status: AC

## 2018-11-09 MED ORDER — SULFAMETHOXAZOLE-TRIMETHOPRIM 800-160 MG PO TABS: 1.0000 | ORAL_TABLET | Freq: Once | ORAL | Status: DC

## 2018-11-09 NOTE — Care Management Note (Signed)
Case Management Note  Patient Details  Name: [name] MRN: [id number] Date of Birth: 09/19/1945  Subjective/Objective: 73 yo F admitted with L thigh wound infection, s/p I & D left thigh              Action/Plan: Received referral to assist with [facility] for NS wet to dry wound care L thigh   Expected Discharge Date:  11/09/18               Expected Discharge Plan:  [place]  In-House Referral:     Discharge planning Services  CM Consult  Post Acute Care Choice:    Choice offered to:     DME Arranged:    DME Agency:     HH Arranged:    HH Agency:  [place]  Status of Service:  In process, will continue to follow  If discussed at Long Length of Stay Meetings, dates discussed:    Additional Comments: Met with pt. She plans to return home with the support of her husband and nephew who is a Marine scientist. She has a RW, bedside commode, shower chair and a wheelchair. She is active with [facility] for nursing and PT. She reports that she talked to her [place] and she is aware that she is in the hospital. Will continue to f/u to arrange [facility] nursing for wound care - Need order.   Norina Buzzard, RN 11/09/2018, 10:43 AM

## 2018-11-09 NOTE — Discharge Instructions (Signed)

## 2018-11-09 NOTE — Telephone Encounter (Signed)
I have attempted to call pt, however, the mailbox is full.   Plan: to inform pt that Dr. Carlota Raspberry is out the office and will be back Monday. Hopefully the procedure has been done if that is what was recommended.   Thanks, [employer]

## 2018-11-09 NOTE — Progress Notes (Signed)
Received referral to arrange [facility] RN for wet to dry dressing changes daily. Contacted Georgina Snell at [facility] and made him aware of referral. Pt also made aware of referral.

## 2018-11-09 NOTE — Discharge Summary (Addendum)
Discharge Summary  [name] KZS:[id number] DOB: 10/09/1945  PCP: [name], MD  Admit date: 11/06/2018 Discharge date: 11/09/2018  Time spent: 35 minutes  Recommendations for Outpatient Follow-up:  1. Follow-up with vascular surgery 2. Follow-up with your PCP 3. Take your medications as prescribed 4. Please continue wet to dry dressing changes daily at home as recommended by vascular surgery Dr Donzetta Matters  Discharge Diagnoses:  Active Hospital Problems   Diagnosis Date Noted  . Wound infection 11/07/2018  . HLD (hyperlipidemia) 09/26/2018  . PVD (peripheral vascular disease) ([place]) 03/15/2018  . Abdominal pain 12/27/2010  . HTN (hypertension) 03/08/2007    Resolved Hospital Problems  No resolved problems to display.    Discharge Condition: Stable  Diet recommendation: Resume previous diet  Vitals:   11/08/18 1930 11/09/18 0401  BP: (!) 146/68 (!) 149/73  Pulse: 69 (!) 58  Resp:    Temp: 98.6 F (37 C) 97.6 F (36.4 C)  SpO2: 98% 93%    History of present illness:  [name] a 72 y.o.femalewith medical history significant ofbipolar disorder, AAA, CVA, CAD, hypertension, tobacco abuse, peripheral vascular disease with recent left femoral to below the knee popliteal artery bypass graft done 2/11/20by vascular surgery presenting with complaints of abdominal pain and left leg pain.She has been having pain in her left calf for the past 2 years. Since her recent femoral-popliteal bypass surgery she has noticed swelling in her left lower leg. She noticed erythema and little bit of blood drainage from her suture sites1 day prior to presentation.  No diarrhea or vomiting.  CT abdomen pelvis with contrast done on 11/07/2018 revealed no acute process and no evidence of bowel obstruction or inflammation.  TRH asked to admit for presumed left thigh wound infection. Vascular surgery consulted.   Negative venous duplex ultrasound left lower extremity however,  abscess or hematoma could not be excluded. Left lower extremity CT with no contrast done on 11/07/2018 revealed suspicion for postoperative seroma versus possibility of abscess versus cellulitis.  No CT evidence of osteomyelitis, myositis or septic joint.   Vascular surgery took to the OR on 11/08/18 for I&D of left thigh wound infection.   11/09/18: Patient seen and examined at her bedside. No acute events overnight. No new complaints. Pain is well controlled at this time.  On the day of discharge, the patient was hemodynamically stable.  Will need to follow-up with vascular surgery and PCP posthospitalization.   Hospital Course:  Principal Problem:   Wound infection Active Problems:   HTN (hypertension)   Abdominal pain   PVD (peripheral vascular disease) ([facility])   HLD (hyperlipidemia)  Left thigh wound infection,  post I&D on 11/08/2018 by vascular surgery -Recent left femoral to below the knee popliteal artery bypass graft done 10/22/18. -Presented with erythema, significant induration, and minimal sanguinous drainage noted at left lower extremity and thigh surgical suture sites.Afebrile. White count mildly elevated at 11.4. Lactic acid normal. -Vasc Surgery followed.  Post I&D 11/08/2018 -Completed 3 days of IV vancomycin and Zosyn -Start bactrim  -Blood culture x2 negative to date -Negative left lower extremity Doppler US, DVT ruled out.  Resolved abdominal pain -Presenting with complaints of right upper quadrant abdominal pain and associated nausea for the past 2 to 3 months. Reports loose stools and is currently taking laxatives and Linzess. No diarrhea. -Lipase and LFTs normal. CT abdomen pelvis negative for acute finding. -Zofran PRN nausea -Tylenol PRN pain -Continue Linzess due to history of chronic constipation.  Peripheral vascular disease -Recent left femoral to below the knee popliteal artery bypass graft done 2/11. -Left lower extremity dorsalis pedis and  posterior tibial pulse dopplerable. -Management of postop wound infection as above. -Continue aspirin and statin -Follow-up with vascular surgery outpatient  Chronic anemia -Stable. Hemoglobin 11.4, atbaseline.  CAD -Stable. No anginal symptoms. Continue aspirin, statin.  Hypertension -Continue amlodipine  Hyperlipidemia -Continue Lipitor  Depression and anxiety -Continue home Cymbalta, Klonopin  GERD -Continue Pepcid PRN   Code Status:Full code  Consults called:Vascular surgery    Discharge Exam: BP (!) 149/73 (BP Location: Left Arm)   Pulse (!) 58   Temp 97.6 F (36.4 C) (Oral)   Resp 15   Ht 5\' 8"  (1.727 m)   Wt 73.9 kg   SpO2 93%   BMI 24.78 kg/m  . General: 73 y.o. year-old female well developed well nourished in no acute distress.  Alert and oriented x3. . Cardiovascular: Regular rate and rhythm with no rubs or gallops.  No thyromegaly or JVD noted.   Marland Kitchen Respiratory: Clear to auscultation with no wheezes or rales. Good inspiratory effort. . Abdomen: Soft nontender nondistended with normal bowel sounds x4 quadrants. . Musculoskeletal: Left lower extremity sutures appear clean with no drainages and mild edema, improved from prior. Marland Kitchen Psychiatry: Mood is appropriate for condition and setting  Discharge Instructions You were cared for by a hospitalist during your hospital stay. If you have any questions about your discharge medications or the care you received while you were in the hospital after you are discharged, you can call the unit and asked to speak with the hospitalist on call if the hospitalist that took care of you is not available. Once you are discharged, your primary care physician will handle any further medical issues. Please note that NO REFILLS for any discharge medications will be authorized once you are discharged, as it is imperative that you return to your primary care physician (or establish a relationship with a primary care  physician if you do not have one) for your aftercare needs so that they can reassess your need for medications and monitor your lab values.   Allergies as of 11/09/2018   No Known Allergies     Medication List    STOP taking these medications   cephALEXin 500 MG capsule Commonly known as:  KEFLEX     TAKE these medications   amLODipine 10 MG tablet Commonly known as:  NORVASC Take 1 tablet (10 mg total) by mouth daily.   aspirin EC 81 MG tablet Take 81 mg by mouth daily.   atorvastatin 40 MG tablet Commonly known as:  LIPITOR Take 1 tablet (40 mg total) by mouth daily.   clonazePAM 0.5 MG tablet Commonly known as:  KLONOPIN Take 0.5mg  tab every bedtime and 1 tab PRN for anxiety What changed:    how much to take  how to take this  when to take this  additional instructions   DULoxetine 60 MG capsule Commonly known as:  CYMBALTA Take 60 mg by mouth every morning.   DULoxetine 30 MG capsule Commonly known as:  CYMBALTA Take 30 mg by mouth every evening.   famotidine 20 MG tablet Commonly known as:  PEPCID One at bedtime What changed:    how much to take  how to take this  when to take this  reasons to take this  additional instructions   lamoTRIgine 150 MG tablet Commonly known as:  LAMICTAL Take 1 tablet (150 mg total)  by mouth 2 (two) times daily. What changed:  when to take this   LAXATIVE PO Take 1 tablet by mouth daily.   linaclotide 145 MCG Caps capsule Commonly known as:  LINZESS Take 1 capsule (145 mcg total) by mouth daily before breakfast. What changed:  when to take this   losartan 50 MG tablet Commonly known as:  COZAAR Take 1 tablet (50 mg total) by mouth daily.   oxyCODONE-acetaminophen 5-325 MG tablet Commonly known as:  PERCOCET/ROXICET Take 1-2 tablets by mouth 2 (two) times daily as needed for moderate pain. What changed:  when to take this   polyethylene glycol powder powder Commonly known as:  GLYCOLAX/MIRALAX Take  17 g by mouth every other day.   rOPINIRole 2 MG tablet Commonly known as:  REQUIP TAKE 1 TABLET BY MOUTH AT BEDTIME   sennosides-docusate sodium 8.6-50 MG tablet Commonly known as:  SENOKOT-S Take 1 tablet by mouth daily.   sulfamethoxazole-trimethoprim 800-160 MG tablet Commonly known as:  BACTRIM DS,SEPTRA DS Take 1 tablet by mouth 2 (two) times daily for 7 days.      No Known Allergies Follow-up Information    [name], MD. Call in 1 day(s).   Specialties:  Family Medicine, Sports Medicine Why:  Please call for a post hospital follow-up appointment. Contact information: [place] [address] [phone]        Lorretta Harp, MD .   Specialties:  Cardiology, Radiology Contact information: [address] [place] [address] [phone]        Elam Dutch, MD. Call in 1 day(s).   Specialties:  Vascular Surgery, Cardiology Why:  Please call for a post hospital follow-up appointment. Contact information: [address] [place] [address] [phone]            The results of significant diagnostics from this hospitalization (including imaging, microbiology, ancillary and laboratory) are listed below for reference.    Significant Diagnostic Studies: Dg Chest 1 View  Result Date: 11/06/2018 CLINICAL DATA:  Cough EXAM: CHEST  1 VIEW COMPARISON:  Abdomen series November 06, 2018; frontal and lateral chest radiographs June 28, 2018 FINDINGS: Lateral chest radiograph obtained. No edema or consolidation seen on lateral view. Heart size within normal limits. There is aortic atherosclerosis. There is degenerative change in the thoracic spine. IMPRESSION: No evident edema or consolidation. Heart size within normal limits. Aortic Atherosclerosis (ICD10-I70.0). Electronically Signed   By: Lowella Grip III M.D.   On: 11/06/2018 18:02   Dg Chest 2 View  Result Date: 11/06/2018 CLINICAL DATA:  Leukocytosis  EXAM: CHEST - 2 VIEW COMPARISON:  07/07/2019 FINDINGS: Hyperinflation of the lungs. Diffuse interstitial prominence throughout the lungs is similar to prior study and likely reflects chronic interstitial lung disease. No acute confluent airspace opacities or effusions. Aortic atherosclerosis. No acute bony abnormality. IMPRESSION: COPD.  Chronic interstitial changes.  No active disease. Electronically Signed   By: Rolm Baptise M.D.   On: 11/06/2018 21:15   Ct Abdomen Pelvis W Contrast  Result Date: 11/07/2018 CLINICAL DATA:  Acute generalized abdominal pain and fever. Right lower quadrant pain on palpation. Left posterior leg pain. Patient had a femoral popliteal bypass on 02/13. EXAM: CT ABDOMEN AND PELVIS WITH CONTRAST TECHNIQUE: Multidetector CT imaging of the abdomen and pelvis was performed using the standard protocol following bolus administration of intravenous contrast. CONTRAST:  179mL OMNIPAQUE IOHEXOL 300 MG/ML  SOLN COMPARISON:  07/30/2018 FINDINGS: Lower chest: Fibrosis in the lung bases.  Hepatobiliary: No focal liver abnormality is seen. No gallstones, gallbladder wall thickening, or biliary dilatation. Pancreas: Unremarkable. No pancreatic ductal dilatation or surrounding inflammatory changes. Spleen: Normal in size without focal abnormality. Adrenals/Urinary Tract: No adrenal gland nodules. Small bilateral renal parenchymal cysts. No hydronephrosis or hydroureter. Bladder is unremarkable. Stomach/Bowel: Stomach, small bowel, and colon are not abnormally distended. No wall thickening or inflammatory changes are seen. Appendix is not identified. Vascular/Lymphatic: Aortic atherosclerosis. No enlarged abdominal or pelvic lymph nodes. Reproductive: Uterus and bilateral adnexa are unremarkable. Other: No free air or free fluid in the abdomen. Abdominal wall musculature appears intact. Infiltration in the subcutaneous fat over the left groin region likely postoperative. No abscess. Musculoskeletal:  Degenerative changes in the spine. No destructive bone lesions. IMPRESSION: No acute process demonstrated in the abdomen or pelvis. No evidence of bowel obstruction or inflammation. Fibrosis in the lung bases. Electronically Signed   By: Lucienne Capers M.D.   On: 11/07/2018 03:04   Dg Abd Acute 2+v W 1v Chest  Result Date: 11/06/2018 CLINICAL DATA:  Abdominal pain and nausea with cough EXAM: DG ABDOMEN ACUTE W/ 1V CHEST COMPARISON:  Chest radiograph July 08, 2018; CT abdomen and pelvis July 30, 2018 FINDINGS: PA chest: No edema or consolidation. Heart size and pulmonary vascular normal. No adenopathy. There is aortic atherosclerosis. Supine and upright abdomen: There is somewhat generalized bowel dilatation. No appreciable air-fluid levels. No free air. There is extensive degenerative change in the lumbar spine with lumbar dextroscoliosis. IMPRESSION: Somewhat generalized bowel dilatation without appreciable air-fluid levels. Suspect ileus or potential enteritis. No free air. No lung edema or consolidation. Aortic Atherosclerosis (ICD10-I70.0). Electronically Signed   By: Lowella Grip III M.D.   On: 11/06/2018 18:03   Vas [place] Burnard Bunting With/wo Tbi  Result Date: 10/23/2018 LOWER EXTREMITY DOPPLER STUDY Indications: Post peripheral bypass. High Risk Factors: Hypertension, coronary artery disease.  Performing Technologist: Maudry Mayhew MHA, RVT, RDCS, RDMS  Examination Guidelines: A complete evaluation includes at minimum, Doppler waveform signals and systolic blood pressure reading at the level of bilateral brachial, anterior tibial, and posterior tibial arteries, when vessel segments are accessible. Bilateral testing is considered an integral part of a complete examination. Photoelectric Plethysmograph (PPG) waveforms and toe systolic pressure readings are included as required and additional duplex testing as needed. Limited examinations for reoccurring indications may be performed as noted.   ABI Findings: +--------+------------------+-----+----------+--------+ Right   Rt Pressure (mmHg)IndexWaveform  Comment  +--------+------------------+-----+----------+--------+ [id number]                    triphasic          +--------+------------------+-----+----------+--------+ PTA     142               0.82 triphasic          +--------+------------------+-----+----------+--------+ DP      161               0.93 monophasic         +--------+------------------+-----+----------+--------+ +--------+------------------+-----+----------+-------+ Left    Lt Pressure (mmHg)IndexWaveform  Comment +--------+------------------+-----+----------+-------+ [id number]                    monophasic        +--------+------------------+-----+----------+-------+ PTA     92                0.53 monophasic        +--------+------------------+-----+----------+-------+ DP      114  0.66 monophasic        +--------+------------------+-----+----------+-------+ +-------+-----------+-----------+------------+------------+ ABI/TBIToday's ABIToday's TBIPrevious ABIPrevious TBI +-------+-----------+-----------+------------+------------+ Right  0.93                                           +-------+-----------+-----------+------------+------------+ Left   0.66                                           +-------+-----------+-----------+------------+------------+  Summary: Right: Resting right ankle-brachial index indicates mild right lower extremity arterial disease. Left: Resting left ankle-brachial index indicates moderate left lower extremity arterial disease.  *See table(s) above for measurements and observations.  Electronically signed by Curt Jews MD on 10/23/2018 at 3:08:56 PM.   Final    Ct Extremity Lower Left Wo Contrast  Result Date: 11/07/2018 CLINICAL DATA:  Patient status post left femoral-popliteal bypass grafting 10/22/2018. Swelling in the left  lower leg. Redness and a small amount of drainage at suture sites on the left leg for the past day. EXAM: CT OF THE LOWER LEFT EXTREMITY WITHOUT CONTRAST TECHNIQUE: Multidetector CT imaging of the lower left extremity was performed according to the standard protocol. COMPARISON:  None. FINDINGS: Bones/Joint/Cartilage No acute or focal bony abnormality is identified. Mild to moderate degenerative change about the left hip is noted. There is mild appearing degenerative disease about the left knee. Ligaments Suboptimally assessed by CT. Muscles and Tendons Intact. No gas tracking along fascial planes is identified. No intramuscular fluid collection is present. There is a small air and fluid collection contiguous with the proximal sartorius which measures 2.6 x 1.4 cm on image 163 of the axial series by 4.7 cm craniocaudal on the coronal series. Soft tissues Stranding in subcutaneous fat is seen along the medial aspect of the upper leg. Diffuse stranding in subcutaneous fat is seen about the mid to distal lower leg. Fluid collections are identified in the medial subcutaneous fatty tissues of the medial upper leg. The largest single collection is lobulated but appears to communicate. This collection measures up to 21 cm craniocaudal on sagittal image 30 x 4.6 cm AP x 3.5 cm transverse on axial image 245. The large component of this collection is centered 14.5 cm above the weight-bearing medial femoral condyle. More superiorly, there are a few air-fluid levels within this collection. The collection has density units of approximately 17. A collection with an air-fluid level measuring 1.4 cm transverse by 2.2 cm AP on image 141 of series 4 is identified. This collection measures 3.5 cm craniocaudal on image 30 of series 10. IMPRESSION: Fluid collections in the subcutaneous tissues of the medial right thigh are nonspecific and could represent postoperative seromas. There are air-fluid levels within some of the components of  these collections raising the possibility of abscess, particularly given gas within the collections more than 2 weeks after surgery. These collection should be amenable to ultrasound-guided aspiration. Negative for CT evidence of osteomyelitis, myositis or septic joint. Diffuse subcutaneous edema in the lower leg increases in intensity more distally and could be due to dependent change and/or cellulitis. Electronically Signed   By: Inge Rise M.D.   On: 11/07/2018 15:30   Vas [place] Lower Extremity Venous (dvt) (only [facility])  Result Date: 11/07/2018  Lower Venous Study Indications: Pain, Swelling, Edema, and Erythema.  Risk Factors: Surgery S/P left femoral to below the knee bypass graft on 10/22/2018. Performing Technologist: Oda Cogan RDMS, RVT  Examination Guidelines: A complete evaluation includes B-mode imaging, spectral Doppler, color Doppler, and power Doppler as needed of all accessible portions of each vessel. Bilateral testing is considered an integral part of a complete examination. Limited examinations for reoccurring indications may be performed as noted.  Right Venous Findings: +---+---------------+---------+-----------+----------+-------+    CompressibilityPhasicitySpontaneityPropertiesSummary +---+---------------+---------+-----------+----------+-------+ CFVFull           Yes      Yes                          +---+---------------+---------+-----------+----------+-------+ SFJFull                                                 +---+---------------+---------+-----------+----------+-------+  Left Venous Findings: +---------+---------------+---------+-----------+----------+-------+          CompressibilityPhasicitySpontaneityPropertiesSummary +---------+---------------+---------+-----------+----------+-------+ CFV      Full           Yes      Yes                          +---------+---------------+---------+-----------+----------+-------+ SFJ      Full                                                  +---------+---------------+---------+-----------+----------+-------+ FV Prox  Full                                                 +---------+---------------+---------+-----------+----------+-------+ FV Mid   Full                                                 +---------+---------------+---------+-----------+----------+-------+ FV DistalFull                                                 +---------+---------------+---------+-----------+----------+-------+ PFV      Full                                                 +---------+---------------+---------+-----------+----------+-------+ POP      Full                                                 +---------+---------------+---------+-----------+----------+-------+ PTV      Full                                                 +---------+---------------+---------+-----------+----------+-------+  PERO     Full                                                 +---------+---------------+---------+-----------+----------+-------+    Summary: Right: No evidence of common femoral vein obstruction. Left: There is no evidence of deep vein thrombosis in the lower extremity. There is evidence of a large, anechoic structures along the mid and distal thigh and proximal calf area incision sites with mixed echoes. Cannot exclude abscess vs. hematoma.  *See table(s) above for measurements and observations. Electronically signed by Monica Martinez MD on 11/07/2018 at 4:18:19 PM.    Final     Microbiology: Recent Results (from the past 240 hour(s))  Blood culture (routine x 2)     Status: None (Preliminary result)   Collection Time: 11/06/18  8:00 PM  Result Value Ref Range Status   Specimen Description BLOOD RIGHT ARM  Final   Special Requests   Final    BOTTLES DRAWN AEROBIC AND ANAEROBIC Blood Culture adequate volume Performed at [facility], [address]  [place], [place] [address]    Culture NO GROWTH 2 DAYS  Final   Report Status PENDING  Incomplete  Blood culture (routine x 2)     Status: None (Preliminary result)   Collection Time: 11/06/18  8:25 PM  Result Value Ref Range Status   Specimen Description BLOOD LEFT ARM  Final   Special Requests   Final    BOTTLES DRAWN AEROBIC ONLY Blood Culture adequate volume Performed at [facility], [place] [address] [place], [place] [address]    Culture NO GROWTH 2 DAYS  Final   Report Status PENDING  Incomplete  Aerobic/Anaerobic Culture (surgical/deep wound)     Status: None (Preliminary result)   Collection Time: 11/08/18  2:38 PM  Result Value Ref Range Status   Specimen Description WOUND LEFT LEG  Final   Special Requests NONE  Final   Gram Stain   Final    NO WBC SEEN NO ORGANISMS SEEN Performed at [facility], [place] [address] [place], [place] [address]    Culture PENDING  Incomplete   Report Status PENDING  Incomplete     Labs: Basic Metabolic Panel: Recent Labs  Lab 11/06/18 2031  NA 131*  K 3.6  CL 95*  CO2 25  GLUCOSE 98  BUN 16  CREATININE 0.89  CALCIUM 9.5   Liver Function Tests: Recent Labs  Lab 11/06/18 2031  AST 23  ALT 26  ALKPHOS 140*  BILITOT 0.6  PROT 7.5  ALBUMIN 3.6   Recent Labs  Lab 11/06/18 2031  LIPASE 23   No results for input(s): AMMONIA in the last 168 hours. CBC: Recent Labs  Lab 11/06/18 1745 11/06/18 2031 11/08/18 0316  WBC 12.5* 11.4* 6.5  NEUTROABS  --  7.2  --   HGB 11.9 11.4* 9.9*  HCT 36.3 36.2 31.8*  MCV 86.7 92.1 91.6  PLT  --  517* 380   Cardiac Enzymes: No results for input(s): CKTOTAL, CKMB, CKMBINDEX, TROPONINI in the last 168 hours. BNP: BNP (last 3 results) No results for input(s): BNP in the last 8760 hours.  ProBNP (last 3 results) No results for input(s): PROBNP in the last 8760 hours.  CBG: No results for input(s): GLUCAP in the last 168 hours.  Signed:  Kayleen Memos, MD [facility] 11/09/2018, 10:58 AM

## 2018-11-09 NOTE — Evaluation (Signed)
Physical Therapy Evaluation Patient Details Name: [name] MRN: [id number] DOB: 01-14-1946 Today's Date: 11/09/2018   History of Present Illness  73 y.o. female with medical history significant of bipolar disorder, AAA, CVA, CAD, hypertension, tobacco abuse, and peripheral vascular disease with recent left femoral to below the knee popliteal artery bypass graft done 2/11. She presented with complaints of abdominal pain and left leg pain.  She underwent L femoral graft site I&D 11/08/18.     Clinical Impression  PT eval complete. Pt required min guard assist transfers and ambulation 25 feet with RW. Pt demonstrates decreased safety awareness. She is impulsive and not receptive to instruction putting her at high risk for falls. Pt active with HHPT prior to admission. Plan is for pt to discharge home today. Recommend resuming [place] services. Pt has all needed DME. She reports using her w/c at home for longer distances. All education complete. No further acute care PT services indicated. PT signing off.    Follow Up Recommendations Home health PT;Supervision/Assistance - 24 hour    Equipment Recommendations  None recommended by PT    Recommendations for Other Services       Precautions / Restrictions Precautions Precautions: Fall      Mobility  Bed Mobility Overal bed mobility: Modified Independent             General bed mobility comments: HOB elevated, +rail  Transfers Overall transfer level: Needs assistance Equipment used: Rolling walker (2 wheeled) Transfers: Sit to/from [facility] Sit to Stand: Min guard Stand pivot transfers: Min guard       General transfer comment: min guard for safety, pt impulsive with decreased safety awareness  Ambulation/Gait Ambulation/Gait assistance: Min guard Gait Distance (Feet): 25 Feet Assistive device: Rolling walker (2 wheeled) Gait Pattern/deviations: Antalgic;Step-through pattern;Decreased stride length Gait  velocity: decreased Gait velocity interpretation: <1.31 ft/sec, indicative of household ambulator General Gait Details: cues for safety  Stairs            Wheelchair Mobility    Modified Rankin (Stroke Patients Only)       Balance Overall balance assessment: Needs assistance Sitting-balance support: Feet supported;No upper extremity supported Sitting balance-Leahy Scale: Good     Standing balance support: Bilateral upper extremity supported;During functional activity Standing balance-Leahy Scale: Poor Standing balance comment: reliant on RW                             Pertinent Vitals/Pain Pain Assessment: Faces Faces Pain Scale: Hurts little more Pain Location: L LE Pain Descriptors / Indicators: Grimacing;Guarding Pain Intervention(s): Monitored during session;Repositioned    Home Living Family/patient expects to be discharged to:: Private residence Living Arrangements: Spouse/significant other Available Help at Discharge: Family;Available 24 hours/day(nephew staying with pt to assist) Type of Home: House Home Access: Stairs to enter Entrance Stairs-Rails: None Entrance Stairs-Number of Steps: 2 Home Layout: Two level;Bed/bath upstairs Home Equipment: Shower seat - built in;Walker - 2 wheels;Cane - single point;Wheelchair - manual      Prior Function Level of Independence: Independent with assistive device(s)         Comments: independent with AD prior to sx 10/22/18.      Hand Dominance   Dominant Hand: Right    Extremity/Trunk Assessment   Upper Extremity Assessment Upper Extremity Assessment: Overall WFL for tasks assessed    Lower Extremity Assessment Lower Extremity Assessment: Generalized weakness LLE Deficits / Details: s/p recent sx LLE  Cervical / Trunk Assessment Cervical / Trunk Assessment: Kyphotic  Communication   Communication: No difficulties  Cognition Arousal/Alertness: Awake/alert Behavior During Therapy:  Anxious;Impulsive Overall Cognitive Status: Impaired/Different from baseline Area of Impairment: Safety/judgement                         Safety/Judgement: Decreased awareness of safety            General Comments      Exercises     Assessment/Plan    PT Assessment All further PT needs can be met in the next venue of care  PT Problem List Decreased balance;Decreased knowledge of precautions;Pain;Decreased mobility;Decreased activity tolerance;Decreased safety awareness       PT Treatment Interventions      PT Goals (Current goals can be found in the Care Plan section)  Acute Rehab PT Goals Patient Stated Goal: home today PT Goal Formulation: All assessment and education complete, DC therapy    Frequency     Barriers to discharge        Co-evaluation               AM-PAC PT "6 Clicks" Mobility  Outcome Measure Help needed turning from your back to your side while in a flat bed without using bedrails?: None Help needed moving from lying on your back to sitting on the side of a flat bed without using bedrails?: None Help needed moving to and from a bed to a chair (including a wheelchair)?: A Little Help needed standing up from a chair using your arms (e.g., wheelchair or bedside chair)?: A Little Help needed to walk in hospital room?: A Little Help needed climbing 3-5 steps with a railing? : A Lot 6 Click Score: 19    End of Session Equipment Utilized During Treatment: Gait belt Activity Tolerance: Patient tolerated treatment well Patient left: in chair;with call bell/phone within reach;with chair alarm set Nurse Communication: Mobility status PT Visit Diagnosis: Unsteadiness on feet (R26.81);Difficulty in walking, not elsewhere classified (R26.2);Pain Pain - Right/Left: Left Pain - part of body: Leg    Time: 0962-8366 PT Time Calculation (min) (ACUTE ONLY): 17 min   Charges:   PT Evaluation $PT Eval Moderate Complexity: 1 Mod           Lorrin Goodell, PT  Office # [phone] Pager [phone]   Lorriane Shire 11/09/2018, 9:28 AM

## 2018-11-09 NOTE — Progress Notes (Signed)
RN gave pt discharge instructions and how to do the wet to dry dressing change and gave pt supplies. Pt stated that's she understood and would be able to do it. Will go over instructions again when her husband arrives, she stated her nephew is an Therapist, sports and would be able to help out as well. Pt IV has been removed and is waiting for husband to bring her clothes so she can get changed. She had 2 prescriptions that were escribed to her home pharmacy.

## 2018-11-09 NOTE — Progress Notes (Signed)
  Progress Note    11/09/2018 11:42 AM 1 Day Post-Op  Subjective: Having pain in left thigh  Vitals:   11/08/18 1930 11/09/18 0401  BP: (!) 146/68 (!) 149/73  Pulse: 69 (!) 58  Resp:    Temp: 98.6 F (37 C) 97.6 F (36.4 C)  SpO2: 98% 93%    Physical Exam: Awake alert oriented Left leg incisions with clean base repacked at bedside Left foot well perfused  CBC    Component Value Date/Time   WBC 6.5 11/08/2018 0316   RBC 3.47 (L) 11/08/2018 0316   HGB 9.9 (L) 11/08/2018 0316   HGB 13.6 03/05/2018 1758   HCT 31.8 (L) 11/08/2018 0316   HCT 41.9 03/05/2018 1758   PLT 380 11/08/2018 0316   PLT 241 03/05/2018 1758   MCV 91.6 11/08/2018 0316   MCV 86.7 11/06/2018 1745   MCV 93 03/05/2018 1758   MCH 28.5 11/08/2018 0316   MCHC 31.1 11/08/2018 0316   RDW 13.9 11/08/2018 0316   RDW 14.5 03/05/2018 1758   LYMPHSABS 2.8 11/06/2018 2031   MONOABS 0.9 11/06/2018 2031   EOSABS 0.4 11/06/2018 2031   BASOSABS 0.1 11/06/2018 2031    BMET    Component Value Date/Time   NA 131 (L) 11/06/2018 2031   NA 139 03/05/2018 1758   K 3.6 11/06/2018 2031   CL 95 (L) 11/06/2018 2031   CO2 25 11/06/2018 2031   GLUCOSE 98 11/06/2018 2031   BUN 16 11/06/2018 2031   BUN 13 03/05/2018 1758   CREATININE 0.89 11/06/2018 2031   CREATININE 0.77 07/28/2015 1805   CALCIUM 9.5 11/06/2018 2031   GFRNONAA >60 11/06/2018 2031   GFRAA >60 11/06/2018 2031    INR    Component Value Date/Time   INR 1.01 10/21/2018 1412     Intake/Output Summary (Last 24 hours) at 11/09/2018 1142 Last data filed at 11/08/2018 1800 Gross per 24 hour  Intake 127.25 ml  Output 5 ml  Net 122.25 ml     Assessment:  73 y.o. female is s/p I&D of left saphenectomy sites inner thigh  Plan: Okay for p.o. antibiotics and discharge is medically appropriate We will follow-up in a few weeks for wound check in our office.   Jesalyn Finazzo C. Donzetta Matters, MD [facility] Office:  [phone] Pager: [phone]  11/09/2018 11:42 AM

## 2018-11-10 DIAGNOSIS — I714 Abdominal aortic aneurysm, without rupture: Secondary | ICD-10-CM | POA: Diagnosis not present

## 2018-11-10 DIAGNOSIS — I739 Peripheral vascular disease, unspecified: Secondary | ICD-10-CM | POA: Diagnosis not present

## 2018-11-10 DIAGNOSIS — I700 Atherosclerosis of aorta: Secondary | ICD-10-CM | POA: Diagnosis not present

## 2018-11-10 DIAGNOSIS — D62 Acute posthemorrhagic anemia: Secondary | ICD-10-CM | POA: Diagnosis not present

## 2018-11-10 DIAGNOSIS — I2510 Atherosclerotic heart disease of native coronary artery without angina pectoris: Secondary | ICD-10-CM | POA: Diagnosis not present

## 2018-11-10 DIAGNOSIS — Z48812 Encounter for surgical aftercare following surgery on the circulatory system: Secondary | ICD-10-CM | POA: Diagnosis not present

## 2018-11-11 ENCOUNTER — Telehealth: Payer: Self-pay | Admitting: Vascular Surgery

## 2018-11-11 LAB — CULTURE, BLOOD (ROUTINE X 2)
Culture: NO GROWTH
Culture: NO GROWTH
SPECIAL REQUESTS: ADEQUATE
Special Requests: ADEQUATE

## 2018-11-11 NOTE — Telephone Encounter (Signed)
sch appt vm full mld ltr 12/05/2018 315pm wound check NP

## 2018-11-11 NOTE — Telephone Encounter (Signed)
-----   Message from Waynetta Sandy, MD sent at 11/09/2018 11:43 AM EST ----- [name] [id number] May 27, 1946  F/u in 2-3 weeks with np/pa or Dr. Oneida Alar for wound check if not already requested

## 2018-11-12 ENCOUNTER — Other Ambulatory Visit: Payer: Self-pay | Admitting: Psychiatry

## 2018-11-12 ENCOUNTER — Telehealth: Payer: Self-pay | Admitting: Psychiatry

## 2018-11-12 DIAGNOSIS — I700 Atherosclerosis of aorta: Secondary | ICD-10-CM | POA: Diagnosis not present

## 2018-11-12 DIAGNOSIS — I739 Peripheral vascular disease, unspecified: Secondary | ICD-10-CM | POA: Diagnosis not present

## 2018-11-12 DIAGNOSIS — Z48812 Encounter for surgical aftercare following surgery on the circulatory system: Secondary | ICD-10-CM | POA: Diagnosis not present

## 2018-11-12 DIAGNOSIS — I714 Abdominal aortic aneurysm, without rupture: Secondary | ICD-10-CM | POA: Diagnosis not present

## 2018-11-12 DIAGNOSIS — I2510 Atherosclerotic heart disease of native coronary artery without angina pectoris: Secondary | ICD-10-CM | POA: Diagnosis not present

## 2018-11-12 DIAGNOSIS — D62 Acute posthemorrhagic anemia: Secondary | ICD-10-CM | POA: Diagnosis not present

## 2018-11-12 NOTE — Telephone Encounter (Signed)
[name] called to request refill on her clonezapam.  [name] had been sending requests but no response received.  She has been in hospital for surgery and hasn't picked up or seen in orders for medication since her release.  Her next appt is 12/24/18   She is unable to walk right now so had to push the appt. Out.  Please send RX to [address]

## 2018-11-12 NOTE — Telephone Encounter (Signed)
OK early refill up to 2 daily until next refill.  Not 30 day refill.  Lynder Parents, MD, DFAPA

## 2018-11-12 NOTE — Telephone Encounter (Signed)
Per PMP pt just picked up #40 on 02/17 from pharmacy

## 2018-11-12 NOTE — Telephone Encounter (Signed)
Spoke with pharmacist, clarified pt DID pick up #40 on 02/18 [facility]. Now after speaking to pt again, her nephew in the background says remember they fell on the floor in the middle of the night and has been thrown away. She only has 2 pills left. Asking for some to get her through till next fill. Explained insurance would not cover and the provider would have to give an okay?

## 2018-11-13 ENCOUNTER — Ambulatory Visit (INDEPENDENT_AMBULATORY_CARE_PROVIDER_SITE_OTHER): Payer: Medicare Other | Admitting: Family Medicine

## 2018-11-13 ENCOUNTER — Other Ambulatory Visit: Payer: Self-pay

## 2018-11-13 ENCOUNTER — Encounter: Payer: Self-pay | Admitting: Family Medicine

## 2018-11-13 VITALS — BP 136/62 | HR 67 | Temp 97.7°F | Resp 14

## 2018-11-13 DIAGNOSIS — R110 Nausea: Secondary | ICD-10-CM

## 2018-11-13 DIAGNOSIS — D72829 Elevated white blood cell count, unspecified: Secondary | ICD-10-CM | POA: Diagnosis not present

## 2018-11-13 DIAGNOSIS — E871 Hypo-osmolality and hyponatremia: Secondary | ICD-10-CM | POA: Diagnosis not present

## 2018-11-13 DIAGNOSIS — I70212 Atherosclerosis of native arteries of extremities with intermittent claudication, left leg: Secondary | ICD-10-CM

## 2018-11-13 DIAGNOSIS — M79605 Pain in left leg: Secondary | ICD-10-CM

## 2018-11-13 LAB — AEROBIC/ANAEROBIC CULTURE W GRAM STAIN (SURGICAL/DEEP WOUND)
Culture: NORMAL
Gram Stain: NONE SEEN

## 2018-11-13 LAB — POCT CBC
GRANULOCYTE PERCENT: 65.9 % (ref 37–80)
HCT, POC: 34.6 % (ref 29–41)
Hemoglobin: 11.9 g/dL (ref 11–14.6)
Lymph, poc: 2.8 (ref 0.6–3.4)
MCH: 30 pg (ref 27–31.2)
MCHC: 34.5 g/dL (ref 31.8–35.4)
MCV: 87.1 fL (ref 76–111)
MID (cbc): 0.7 (ref 0–0.9)
MPV: 5.7 fL (ref 0–99.8)
POC Granulocyte: 6.8 (ref 2–6.9)
POC LYMPH PERCENT: 27.4 %L (ref 10–50)
POC MID %: 6.7 %M (ref 0–12)
Platelet Count, POC: 399 10*3/uL (ref 142–424)
RBC: 3.98 M/uL — AB (ref 4.04–5.48)
RDW, POC: 14.3 %
WBC: 10.3 10*3/uL — AB (ref 4.6–10.2)

## 2018-11-13 MED ORDER — ONDANSETRON 4 MG PO TBDP: 4.0000 mg | ORAL_TABLET | Freq: Three times a day (TID) | ORAL | 0 refills | Status: DC | PRN

## 2018-11-13 NOTE — Patient Instructions (Addendum)
I did prescribe a new prescription for ondansetron pills that can be taken every 8 hours as needed for nausea, but be careful with constipation from that medication.  I expect nausea to improve with septra stopping in a few days.Continue stool softeners while using narcotic pain medications.  If you need refills of pain medication please contact your surgeon's office.   Call surgeon for follow up. Continue wet to dry dressing changes as instructed prior until seen by your surgeon.  The indurated or firm area below the wounds may be due to the packing.  However I would still recommend close follow-up with surgeon.  If any new redness or worsening symptoms off antibiotics, be seen right away.  Return to the clinic or go to the nearest emergency room if any of your symptoms worsen or new symptoms occur.    If you have lab work done today you will be contacted with your lab results within the next 2 weeks.  If you have not heard from [place] then please contact us. The fastest way to get your results is to register for My Chart.   IF you received an x-ray today, you will receive an invoice from [facility]. Please contact [facility] at [phone] with questions or concerns regarding your invoice.   IF you received labwork today, you will receive an invoice from [facility]. Please contact [facility] at [phone] with questions or concerns regarding your invoice.   Our billing staff will not be able to assist you with questions regarding bills from these companies.  You will be contacted with the lab results as soon as they are available. The fastest way to get your results is to activate your My Chart account. Instructions are located on the last page of this paperwork. If you have not heard from [place] regarding the results in 2 weeks, please contact this office.

## 2018-11-13 NOTE — Progress Notes (Signed)
Subjective:    Patient ID: [name], female    DOB: 29-Oct-1945, 73 y.o.   MRN: [id number]  HPI [name] is a 73 y.o. female Presents today for: Chief Complaint  Patient presents with  . Hospitalization Follow-up    wen to the hospital 11/06/18 to have laceration look at again the reopen the area and put me on abx. have been nauseated due to the abx.    Here for hospital follow-up.  Admitted February 26 through February 29 with suspected wound infection, leukocytosis.  I saw her on February 26 and she was complaining of abdominal pain, chills, nausea.  Multiple dilated areas of bowel, initially concern for possible early SBO versus ileus.  Ultimately had CT abdomen pelvis on February 27 without acute process or evidence of bowel obstruction/inflammation.  Doppler of her left lower leg was performed given left calf pain, swelling for significant time previously.  Negative duplex ultrasound left lower extremity. She had below the knee popliteal artery bypass graft on February 11, had noticed some erythema and slight drainage from her suture sites day prior to seeing me.  Left lower extremity CT on February 27 revealed suspicion for postoperative seroma versus abscess versus cellulitis but no sign of osteomyelitis, myositis or septic joint.  Ultimately went to the OR on February 28 for I&D of a left thigh wound/fluid collection.    Left thigh wound infection with PVD: Initial leukocytosis of 11.4, normal lactic acid.  I&D February 28.  3 days of IV vancomycin and Zosyn, then transition to Bactrim.  Blood culture x2-.  DVT ruled out as above.  Plan for continued wet-to-dry dressings daily. Has appointment scheduled for March 26 with vascular surgeon. Will be calling to be seen sooner likely.  Continue on aspirin and statin.  Left leg wound culture no WBC seen no organisms seen.  Blood cultures no growth x5 days. Still on Bactrim 800/160 BID - 7 day course from 2/29. Nephew (former nurse)  here to help with dressing changes.  2 more days of septra.  No fevers.  Nausea noted after taking bactrim.  Has ondansetron - took one last night. D/n like taste.  On miralax every other day, linzess - once per day - for IBS-D - taking in am, senokot S QD. Less pain meds now - oxycodone BID.  Last BM yesterday-  Soft stool.   WTD dressing qd and has home nurse.    Lab Results  Component Value Date   WBC 6.5 11/08/2018   HGB 9.9 (L) 11/08/2018   HCT 31.8 (L) 11/08/2018   MCV 91.6 11/08/2018   PLT 380 11/08/2018     Abdominal pain  -As above head CT that was negative for acute findings, and lipase and LFTs were normal.  Zofran was given if needed for nausea, Linzess for chronic constipation.  Minimal soreness. BM yesterday - see above.   Hyponatremia - 131 on labs 1 week ago. Normal prior - repeat today.   Patient Active Problem List   Diagnosis Date Noted  . Wound infection 11/07/2018  . Postoperative pain   . Sleep disturbance   . Tobacco abuse   . Acute blood loss anemia   . Hypoalbuminemia due to protein-calorie malnutrition ([place])   . Debility 10/24/2018  . Pre-operative cardiovascular examination 09/26/2018  . HLD (hyperlipidemia) 09/26/2018  . GAD (generalized anxiety disorder) 08/07/2018  . Major depressive disorder, single episode 08/07/2018  . Appendiceal abscess 07/08/2018  . PVD (peripheral vascular disease) (  [place]) 03/15/2018  . Atherosclerosis of native artery of left lower extremity with intermittent claudication ([place]) 03/15/2018  . Chronic left-sided low back pain with left-sided sciatica 12/25/2017  . Facial droop   . History of CVA (cerebrovascular accident) 10/23/2017  . Critical lower limb ischemia 11/08/2016  . Smoker 11/08/2016  . Postinflammatory pulmonary fibrosis ([place]) 02/14/2016  . Cigarette smoker 12/24/2015  . Hypothyroid ? 01/16/2014  . Mild cognitive impairment 01/16/2014  . Meningioma ([place]) 10/21/2013  . Chest pain 10/20/2013  . Medicare  annual wellness visit, subsequent 06/16/2013  . Dizziness and giddiness 06/16/2013  . RLS (restless legs syndrome) 04/29/2012  . Abdominal pain 12/27/2010  . DEGENERATIVE JOINT DISEASE 09/23/2010  . CAROTID ARTERY DISEASE 08/15/2010  . CHEST PAIN 08/15/2010  . HEADACHE 12/02/2009  . BACK PAIN 11/22/2009  . CONSTIPATION, CHRONIC 01/29/2008  . NAUSEA 01/28/2008  . DEPRESSION 03/08/2007  . HTN (hypertension) 03/08/2007   Past Medical History:  Diagnosis Date  . AAA (abdominal aortic aneurysm) (HCC)    3.1 cm 07/08/18, 3 year follow-up recommended; possible 3 cm AAA by aortogram 09/13/18  . Anemia    PMH  . Appendicitis with abscess    07/08/18, s/p perc drain; resolved 07/30/18 by CT  . Arthritis   . Bipolar disorder ([place])   . Cerebrovascular disease    intra and extracranial vascular dx per MRI 4/11, neurology rec strict CVRF control  . Colonic inertia   . Constipation    chronic;severe  . Coronary artery disease   . Depression   . Duodenitis   . EKG abnormalities    changes, stress test neg (false EKG changes)  . Gastritis   . GERD (gastroesophageal reflux disease)   . Hypertension   . Hypothyroid 01/16/2014  . Meningioma ([place]) 10/21/2013  . PAD (peripheral artery disease) ([place])   . Psoriasis    sees derm  . Stroke ([place])   . Wears dentures   . Wears glasses    Past Surgical History:  Procedure Laterality Date  . ABDOMINAL AORTOGRAM W/LOWER EXTREMITY N/A 09/13/2018   Procedure: ABDOMINAL AORTOGRAM W/LOWER EXTREMITY;  Surgeon: Elam Dutch, MD;  Location: [facility];  Service: Cardiovascular;  Laterality: N/A;  . arthroscopy  04/2010   Right knee  . CATARACT EXTRACTION W/ INTRAOCULAR LENS  IMPLANT, BILATERAL    . COLONOSCOPY    . FEMORAL-POPLITEAL BYPASS GRAFT  10/22/2018  . FEMORAL-POPLITEAL BYPASS GRAFT Left 10/22/2018   Procedure: LEFT FEMORAL TO BELOW THE KNEE POPLITEAL ARTERY BYPASS GRAFT;  Surgeon: Elam Dutch, MD;  Location: [facility];  Service:  Vascular;  Laterality: Left;  . I&D EXTREMITY Left 11/08/2018   Procedure: IRRIGATION AND DEBRIDEMENT EXTREMITY Left Leg;  Surgeon: Elam Dutch, MD;  Location: [facility];  Service: Vascular;  Laterality: Left;  . IR RADIOLOGIST EVAL & MGMT  07/30/2018  . MULTIPLE TOOTH EXTRACTIONS    . TUBAL LIGATION     No Known Allergies Prior to Admission medications   Medication Sig Start Date End Date Taking? Authorizing Provider  amLODipine (NORVASC) 10 MG tablet Take 1 tablet (10 mg total) by mouth daily. 10/28/18  Yes Angiulli, [name], PA-C  aspirin EC 81 MG tablet Take 81 mg by mouth daily.   Yes [provider]  atorvastatin (LIPITOR) 40 MG tablet Take 1 tablet (40 mg total) by mouth daily. 10/28/18 01/26/19 Yes Angiulli, [name], PA-C  Bisacodyl (LAXATIVE PO) Take 1 tablet by mouth daily.   Yes [provider]  clonazePAM (KLONOPIN) 0.5 MG tablet Take 0.5mg  tab every bedtime and 1 tab PRN for anxiety Patient taking differently: Take 0.5 mg by mouth See admin instructions. Take 0.5 mg by mouth at bedtime and an additional 0.5 mg once a day as needed for anxiety 10/28/18  Yes Angiulli, [name], PA-C  DULoxetine (CYMBALTA) 30 MG capsule Take 30 mg by mouth every evening.    Yes [provider]  DULoxetine (CYMBALTA) 60 MG capsule Take 60 mg by mouth every morning.    Yes [provider]  famotidine (PEPCID) 20 MG tablet One at bedtime Patient taking differently: Take 20 mg by mouth daily as needed for heartburn or indigestion (or nausea).  02/14/16  Yes Tanda Rockers, MD  lamoTRIgine (LAMICTAL) 150 MG tablet Take 1 tablet (150 mg total) by mouth 2 (two) times daily. Patient taking differently: Take 150 mg by mouth daily.  09/09/18  Yes Cottle, Billey Co., MD  linaclotide [facility]) 145 MCG CAPS capsule Take 1 capsule (145 mcg total) by mouth daily before breakfast. Patient taking differently: Take 145 mcg by mouth at bedtime.  10/28/18  Yes Angiulli, [name], PA-C    losartan (COZAAR) 50 MG tablet Take 1 tablet (50 mg total) by mouth daily. 10/25/17  Yes Rai, Ripudeep K, MD  polyethylene glycol powder (GLYCOLAX/MIRALAX) powder Take 17 g by mouth every other day.   Yes [provider]  rOPINIRole (REQUIP) 2 MG tablet TAKE 1 TABLET BY MOUTH AT BEDTIME Patient taking differently: Take 2 mg by mouth at bedtime.  05/27/12  Yes Dunn, Areta Haber, PA-C  sennosides-docusate sodium (SENOKOT-S) 8.6-50 MG tablet Take 1 tablet by mouth daily.   Yes [provider]  sulfamethoxazole-trimethoprim (BACTRIM DS,SEPTRA DS) 800-160 MG tablet Take 1 tablet by mouth 2 (two) times daily for 7 days. 11/09/18 11/16/18 Yes Hall, Carole N, DO  ondansetron (ZOFRAN ODT) 4 MG disintegrating tablet Take 1 tablet (4 mg total) by mouth every 8 (eight) hours as needed for nausea or vomiting. 11/13/18   [name], MD  oxyCODONE-acetaminophen (PERCOCET/ROXICET) 5-325 MG tablet Take 1-2 tablets by mouth 2 (two) times daily as needed for moderate pain. Patient not taking: Reported on 11/13/2018 11/09/18   Kayleen Memos, DO   Social History   Socioeconomic History  . Marital status: Married    Spouse name: Not on file  . Number of children: 1  . Years of education: Not on file  . Highest education level: Not on file  Occupational History  . Occupation: retired  Scientific laboratory technician  . Financial resource strain: Not hard at all  . Food insecurity:    Worry: Patient refused    Inability: Patient refused  . Transportation needs:    Medical: Patient refused    Non-medical: Patient refused  Tobacco Use  . Smoking status: Current Every Day Smoker    Packs/day: 1.00    Years: 60.00    Pack years: 60.00    Types: Cigarettes  . Smokeless tobacco: Never Used  Substance and Sexual Activity  . Alcohol use: Yes    Alcohol/week: 0.0 standard drinks    Comment: yes on occassion  . Drug use: No  . Sexual activity: Yes  Lifestyle  . Physical activity:    Days per week: Patient refused     Minutes per session: Patient refused  . Stress: Not on file  Relationships  . Social connections:    Talks on phone: Patient refused    Gets together:  Patient refused    Attends religious service: Patient refused    Active member of club or organization: Patient refused    Attends meetings of clubs or organizations: Patient refused    Relationship status: Patient refused  . Intimate partner violence:    Fear of current or ex partner: Patient refused    Emotionally abused: Patient refused    Physically abused: Patient refused    Forced sexual activity: Patient refused  Other Topics Concern  . Not on file  Social History Narrative   Brother in law Mr Rexford Maus (one of my patients)   Lives w/ husband        Review of Systems Per HPI.     Objective:   Physical Exam Vitals signs reviewed.  Constitutional:      General: She is not in acute distress.    Appearance: She is well-developed.  HENT:     Head: Normocephalic and atraumatic.  Cardiovascular:     Rate and Rhythm: Normal rate and regular rhythm.     Comments: 2-3/6sys murmur.  Pulmonary:     Effort: Pulmonary effort is normal.  Skin:      Neurological:     Mental Status: She is alert and oriented to person, place, and time.    Results for orders placed or performed in visit on 19/14/78  Basic metabolic panel  Result Value Ref Range   Glucose 55 (L) 65 - 99 mg/dL   BUN 17 8 - 27 mg/dL   Creatinine, Ser 1.13 (H) 0.57 - 1.00 mg/dL   GFR calc non Af Amer 48 (L) >59 mL/min/1.73   GFR calc Af Amer 55 (L) >59 mL/min/1.73   BUN/Creatinine Ratio 15 12 - 28   Sodium 134 134 - 144 mmol/L   Potassium 4.4 3.5 - 5.2 mmol/L   Chloride 98 96 - 106 mmol/L   CO2 24 20 - 29 mmol/L   Calcium 9.7 8.7 - 10.3 mg/dL  POCT CBC  Result Value Ref Range   WBC 10.3 (A) 4.6 - 10.2 K/uL   Lymph, poc 2.8 0.6 - 3.4   POC LYMPH PERCENT 27.4 10 - 50 %L   MID (cbc) 0.7 0 - 0.9   POC MID % 6.7 0 - 12 %M   POC Granulocyte 6.8 2 -  6.9   Granulocyte percent 65.9 37 - 80 %G   RBC 3.98 (A) 4.04 - 5.48 M/uL   Hemoglobin 11.9 11 - 14.6 g/dL   HCT, POC 34.6 29 - 41 %   MCV 87.1 76 - 111 fL   MCH, POC 30.0 27 - 31.2 pg   MCHC 34.5 31.8 - 35.4 g/dL   RDW, POC 14.3 %   Platelet Count, POC 399 142 - 424 K/uL   MPV 5.7 0 - 99.8 fL       Assessment & Plan:   EMMAJO BENNETTE is a 73 y.o. female Leukocytosis, unspecified type - Plan: POCT CBC Left leg pain - Plan: POCT CBC  -Borderline, but afebrile.  Wounds do not appear to have significant infection on my exam today.  Dressing intact.  Area of wound with possible induration versus firmness from packing.  Advise follow-up with surgeon but RTC/ER precautions if any surrounding erythema, increasing pain, fever or other worsening symptoms  Nausea without vomiting - Plan: ondansetron (ZOFRAN ODT) 4 MG disintegrating tablet  -Nausea may be related to antibiotic, but only has a few days left.  Continue Zofran as needed, cautioned on secondary  constipation from Zofran and bowel regimen discussed.  Hyponatremia - Plan: Basic metabolic panel  -Repeat BMP sodium normal.  Meds ordered this encounter  Medications  . ondansetron (ZOFRAN ODT) 4 MG disintegrating tablet    Sig: Take 1 tablet (4 mg total) by mouth every 8 (eight) hours as needed for nausea or vomiting.    Dispense:  10 tablet    Refill:  0   Patient Instructions   I did prescribe a new prescription for ondansetron pills that can be taken every 8 hours as needed for nausea, but be careful with constipation from that medication.  I expect nausea to improve with septra stopping in a few days.Continue stool softeners while using narcotic pain medications.  If you need refills of pain medication please contact your surgeon's office.   Call surgeon for follow up. Continue wet to dry dressing changes as instructed prior until seen by your surgeon.  The indurated or firm area below the wounds may be due to the packing.   However I would still recommend close follow-up with surgeon.  If any new redness or worsening symptoms off antibiotics, be seen right away.  Return to the clinic or go to the nearest emergency room if any of your symptoms worsen or new symptoms occur.    If you have lab work done today you will be contacted with your lab results within the next 2 weeks.  If you have not heard from [place] then please contact us. The fastest way to get your results is to register for My Chart.   IF you received an x-ray today, you will receive an invoice from [facility]. Please contact [facility] at [phone] with questions or concerns regarding your invoice.   IF you received labwork today, you will receive an invoice from [facility]. Please contact [facility] at [phone] with questions or concerns regarding your invoice.   Our billing staff will not be able to assist you with questions regarding bills from these companies.  You will be contacted with the lab results as soon as they are available. The fastest way to get your results is to activate your My Chart account. Instructions are located on the last page of this paperwork. If you have not heard from [place] regarding the results in 2 weeks, please contact this office.       Signed,   Merri Ray, MD [facility].  11/16/18 12:45 PM

## 2018-11-13 NOTE — Telephone Encounter (Signed)
Spoke with pharmacist, ok clonazepam 0.5mg  1 at hs and 1 qd as needed. #20 only no additional refills. Pt will have to pay out of pocket due to being early. Pt aware.

## 2018-11-14 ENCOUNTER — Encounter: Payer: Medicare Other | Admitting: Vascular Surgery

## 2018-11-14 DIAGNOSIS — I700 Atherosclerosis of aorta: Secondary | ICD-10-CM | POA: Diagnosis not present

## 2018-11-14 DIAGNOSIS — I714 Abdominal aortic aneurysm, without rupture: Secondary | ICD-10-CM | POA: Diagnosis not present

## 2018-11-14 DIAGNOSIS — D62 Acute posthemorrhagic anemia: Secondary | ICD-10-CM | POA: Diagnosis not present

## 2018-11-14 DIAGNOSIS — I739 Peripheral vascular disease, unspecified: Secondary | ICD-10-CM | POA: Diagnosis not present

## 2018-11-14 DIAGNOSIS — I2510 Atherosclerotic heart disease of native coronary artery without angina pectoris: Secondary | ICD-10-CM | POA: Diagnosis not present

## 2018-11-14 DIAGNOSIS — Z48812 Encounter for surgical aftercare following surgery on the circulatory system: Secondary | ICD-10-CM | POA: Diagnosis not present

## 2018-11-14 LAB — BASIC METABOLIC PANEL
BUN/Creatinine Ratio: 15 (ref 12–28)
BUN: 17 mg/dL (ref 8–27)
CO2: 24 mmol/L (ref 20–29)
Calcium: 9.7 mg/dL (ref 8.7–10.3)
Chloride: 98 mmol/L (ref 96–106)
Creatinine, Ser: 1.13 mg/dL — ABNORMAL HIGH (ref 0.57–1.00)
GFR calc non Af Amer: 48 mL/min/{1.73_m2} — ABNORMAL LOW (ref >59)
GFR, EST AFRICAN AMERICAN: 55 mL/min/{1.73_m2} — AB (ref >59)
Glucose: 55 mg/dL — ABNORMAL LOW (ref 65–99)
Potassium: 4.4 mmol/L (ref 3.5–5.2)
Sodium: 134 mmol/L (ref 134–144)

## 2018-11-15 ENCOUNTER — Telehealth: Payer: Self-pay | Admitting: Family Medicine

## 2018-11-15 DIAGNOSIS — Z48812 Encounter for surgical aftercare following surgery on the circulatory system: Secondary | ICD-10-CM | POA: Diagnosis not present

## 2018-11-15 DIAGNOSIS — D62 Acute posthemorrhagic anemia: Secondary | ICD-10-CM | POA: Diagnosis not present

## 2018-11-15 DIAGNOSIS — I700 Atherosclerosis of aorta: Secondary | ICD-10-CM | POA: Diagnosis not present

## 2018-11-15 DIAGNOSIS — I739 Peripheral vascular disease, unspecified: Secondary | ICD-10-CM | POA: Diagnosis not present

## 2018-11-15 DIAGNOSIS — I714 Abdominal aortic aneurysm, without rupture: Secondary | ICD-10-CM | POA: Diagnosis not present

## 2018-11-15 DIAGNOSIS — I2510 Atherosclerotic heart disease of native coronary artery without angina pectoris: Secondary | ICD-10-CM | POA: Diagnosis not present

## 2018-11-15 NOTE — Telephone Encounter (Signed)
Verbal given 

## 2018-11-15 NOTE — Telephone Encounter (Signed)
Copied from [place] [id number]. Topic: Quick Communication - Home Health Verbal Orders >> Nov 15, 2018 10:13 AM Rayann Heman wrote: Caller/Agency:[facility]  Callback Number:[phone] Requesting OT Frequency: 1x1 0x1 1x3

## 2018-11-16 ENCOUNTER — Encounter: Payer: Self-pay | Admitting: Family Medicine

## 2018-11-18 ENCOUNTER — Ambulatory Visit: Payer: Medicare Other | Admitting: Psychiatry

## 2018-11-18 DIAGNOSIS — I2510 Atherosclerotic heart disease of native coronary artery without angina pectoris: Secondary | ICD-10-CM | POA: Diagnosis not present

## 2018-11-18 DIAGNOSIS — D62 Acute posthemorrhagic anemia: Secondary | ICD-10-CM | POA: Diagnosis not present

## 2018-11-18 DIAGNOSIS — Z48812 Encounter for surgical aftercare following surgery on the circulatory system: Secondary | ICD-10-CM | POA: Diagnosis not present

## 2018-11-18 DIAGNOSIS — I739 Peripheral vascular disease, unspecified: Secondary | ICD-10-CM | POA: Diagnosis not present

## 2018-11-18 DIAGNOSIS — I700 Atherosclerosis of aorta: Secondary | ICD-10-CM | POA: Diagnosis not present

## 2018-11-18 DIAGNOSIS — I714 Abdominal aortic aneurysm, without rupture: Secondary | ICD-10-CM | POA: Diagnosis not present

## 2018-11-20 ENCOUNTER — Other Ambulatory Visit: Payer: Self-pay

## 2018-11-20 ENCOUNTER — Emergency Department (HOSPITAL_COMMUNITY)
Admission: EM | Admit: 2018-11-20 | Discharge: 2018-11-21 | Disposition: A | Discharge status: Home / Self Care | Payer: Medicare Other | Attending: Emergency Medicine | Admitting: Emergency Medicine

## 2018-11-20 ENCOUNTER — Encounter (HOSPITAL_COMMUNITY): Payer: Self-pay

## 2018-11-20 DIAGNOSIS — I2510 Atherosclerotic heart disease of native coronary artery without angina pectoris: Secondary | ICD-10-CM | POA: Insufficient documentation

## 2018-11-20 DIAGNOSIS — Z5189 Encounter for other specified aftercare: Secondary | ICD-10-CM | POA: Diagnosis not present

## 2018-11-20 DIAGNOSIS — Z7982 Long term (current) use of aspirin: Secondary | ICD-10-CM | POA: Insufficient documentation

## 2018-11-20 DIAGNOSIS — F17210 Nicotine dependence, cigarettes, uncomplicated: Secondary | ICD-10-CM | POA: Insufficient documentation

## 2018-11-20 DIAGNOSIS — L02416 Cutaneous abscess of left lower limb: Secondary | ICD-10-CM | POA: Insufficient documentation

## 2018-11-20 DIAGNOSIS — I10 Essential (primary) hypertension: Secondary | ICD-10-CM | POA: Insufficient documentation

## 2018-11-20 DIAGNOSIS — Z79899 Other long term (current) drug therapy: Secondary | ICD-10-CM | POA: Diagnosis not present

## 2018-11-20 DIAGNOSIS — E039 Hypothyroidism, unspecified: Secondary | ICD-10-CM | POA: Insufficient documentation

## 2018-11-20 DIAGNOSIS — Z4800 Encounter for change or removal of nonsurgical wound dressing: Secondary | ICD-10-CM | POA: Diagnosis not present

## 2018-11-20 LAB — CBC WITH DIFFERENTIAL/PLATELET
Abs Immature Granulocytes: 0.05 10*3/uL (ref 0.00–0.07)
Basophils Absolute: 0.1 10*3/uL (ref 0.0–0.1)
Basophils Relative: 1 %
Eosinophils Absolute: 0.4 10*3/uL (ref 0.0–0.5)
Eosinophils Relative: 4 %
HCT: 40 % (ref 36.0–46.0)
Hemoglobin: 12.6 g/dL (ref 12.0–15.0)
Immature Granulocytes: 1 %
LYMPHS ABS: 2.3 10*3/uL (ref 0.7–4.0)
Lymphocytes Relative: 24 %
MCH: 29.2 pg (ref 26.0–34.0)
MCHC: 31.5 g/dL (ref 30.0–36.0)
MCV: 92.8 fL (ref 80.0–100.0)
Monocytes Absolute: 1 10*3/uL (ref 0.1–1.0)
Monocytes Relative: 10 %
Neutro Abs: 5.8 10*3/uL (ref 1.7–7.7)
Neutrophils Relative %: 60 %
Platelets: 290 10*3/uL (ref 150–400)
RBC: 4.31 MIL/uL (ref 3.87–5.11)
RDW: 13.9 % (ref 11.5–15.5)
WBC: 9.5 10*3/uL (ref 4.0–10.5)
nRBC: 0 % (ref 0.0–0.2)

## 2018-11-20 LAB — COMPREHENSIVE METABOLIC PANEL
ALT: 21 U/L (ref 0–44)
AST: 24 U/L (ref 15–41)
Albumin: 3.8 g/dL (ref 3.5–5.0)
Alkaline Phosphatase: 100 U/L (ref 38–126)
Anion gap: 10 (ref 5–15)
BILIRUBIN TOTAL: 0.6 mg/dL (ref 0.3–1.2)
BUN: 12 mg/dL (ref 8–23)
CO2: 23 mmol/L (ref 22–32)
Calcium: 9.9 mg/dL (ref 8.9–10.3)
Chloride: 100 mmol/L (ref 98–111)
Creatinine, Ser: 0.91 mg/dL (ref 0.44–1.00)
GFR calc Af Amer: >60 mL/min (ref >60)
GFR calc non Af Amer: >60 mL/min (ref >60)
Glucose, Bld: 100 mg/dL — ABNORMAL HIGH (ref 70–99)
Potassium: 4.3 mmol/L (ref 3.5–5.1)
Sodium: 133 mmol/L — ABNORMAL LOW (ref 135–145)
Total Protein: 7.8 g/dL (ref 6.5–8.1)

## 2018-11-20 LAB — LACTIC ACID, PLASMA: Lactic Acid, Venous: 1.1 mmol/L (ref 0.5–1.9)

## 2018-11-20 MED ORDER — SODIUM CHLORIDE 0.9% FLUSH: 3.0000 mL | Freq: Once | INTRAVENOUS | Status: DC

## 2018-11-20 NOTE — ED Provider Notes (Signed)
[facility] EMERGENCY DEPARTMENT Provider Note   CSN: [id number] Arrival date & time: 11/20/18  1746    History   Chief Complaint Chief Complaint  Patient presents with  . Wound Infection    HPI [name] is a 73 y.o. female.     Patient presents to the emergency department with a chief complaint of needing wound check.  She underwent femoropopliteal bypass surgery in her left lower extremity last month.  Subsequently developed abscesses which required incision and drainage by vascular surgery.  She is still healing from these abscesses.  Is no longer taking antibiotics.  Denies any fevers or chills.  States that she noticed some green "gunk" in the wounds and decided to come for evaluation.  She denies any other associated symptoms.  The history is provided by the patient. No language interpreter was used.    Past Medical History:  Diagnosis Date  . AAA (abdominal aortic aneurysm) (HCC)    3.1 cm 07/08/18, 3 year follow-up recommended; possible 3 cm AAA by aortogram 09/13/18  . Anemia    PMH  . Appendicitis with abscess    07/08/18, s/p perc drain; resolved 07/30/18 by CT  . Arthritis   . Bipolar disorder ([place])   . Cerebrovascular disease    intra and extracranial vascular dx per MRI 4/11, neurology rec strict CVRF control  . Colonic inertia   . Constipation    chronic;severe  . Coronary artery disease   . Depression   . Duodenitis   . EKG abnormalities    changes, stress test neg (false EKG changes)  . Gastritis   . GERD (gastroesophageal reflux disease)   . Hypertension   . Hypothyroid 01/16/2014  . Meningioma ([place]) 10/21/2013  . PAD (peripheral artery disease) ([place])   . Psoriasis    sees derm  . Stroke ([place])   . Wears dentures   . Wears glasses     Patient Active Problem List   Diagnosis Date Noted  . Wound infection 11/07/2018  . Postoperative pain   . Sleep disturbance   . Tobacco abuse   . Acute blood loss anemia   .  Hypoalbuminemia due to protein-calorie malnutrition ([place])   . Debility 10/24/2018  . Pre-operative cardiovascular examination 09/26/2018  . HLD (hyperlipidemia) 09/26/2018  . GAD (generalized anxiety disorder) 08/07/2018  . Major depressive disorder, single episode 08/07/2018  . Appendiceal abscess 07/08/2018  . PVD (peripheral vascular disease) ([place]) 03/15/2018  . Atherosclerosis of native artery of left lower extremity with intermittent claudication ([place]) 03/15/2018  . Chronic left-sided low back pain with left-sided sciatica 12/25/2017  . Facial droop   . History of CVA (cerebrovascular accident) 10/23/2017  . Critical lower limb ischemia 11/08/2016  . Smoker 11/08/2016  . 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 Cigarette smoker 12/24/2015  . Hypothyroid ? 01/16/2014  . Mild cognitive impairment 01/16/2014  . Meningioma ([place]) 10/21/2013  . Chest pain 10/20/2013  . Medicare annual wellness visit, subsequent 06/16/2013  . Dizziness and giddiness 06/16/2013  . RLS (restless legs syndrome) 04/29/2012  . Abdominal pain 12/27/2010  . DEGENERATIVE JOINT DISEASE 09/23/2010  . CAROTID ARTERY DISEASE 08/15/2010  . CHEST PAIN 08/15/2010  . HEADACHE 12/02/2009  . BACK PAIN 11/22/2009  . CONSTIPATION, CHRONIC 01/29/2008  . NAUSEA 01/28/2008  . DEPRESSION 03/08/2007  . HTN (hypertension) 03/08/2007    Past Surgical History:  Procedure Laterality Date  . ABDOMINAL AORTOGRAM W/LOWER EXTREMITY N/A 09/13/2018   Procedure: ABDOMINAL AORTOGRAM W/LOWER EXTREMITY;  Surgeon: Elam Dutch, MD;  Location: [facility];  Service: Cardiovascular;  Laterality: N/A;  . arthroscopy  04/2010   Right knee  . CATARACT EXTRACTION W/ INTRAOCULAR LENS  IMPLANT, BILATERAL    . COLONOSCOPY    . FEMORAL-POPLITEAL BYPASS GRAFT  10/22/2018  . FEMORAL-POPLITEAL BYPASS GRAFT Left 10/22/2018   Procedure: LEFT FEMORAL TO BELOW THE KNEE POPLITEAL ARTERY BYPASS GRAFT;  Surgeon: Elam Dutch, MD;  Location: [facility];  Service: Vascular;  Laterality: Left;  . I&D EXTREMITY Left 11/08/2018   Procedure: IRRIGATION AND DEBRIDEMENT EXTREMITY Left Leg;  Surgeon: Elam Dutch, MD;  Location: [facility];  Service: Vascular;  Laterality: Left;  . IR RADIOLOGIST EVAL & MGMT  07/30/2018  . MULTIPLE TOOTH EXTRACTIONS    . TUBAL LIGATION       OB History   No obstetric history on file.      Home Medications    Prior to Admission medications   Medication Sig Start Date End Date Taking? Authorizing Provider  amLODipine (NORVASC) 10 MG tablet Take 1 tablet (10 mg total) by mouth daily. 10/28/18   Angiulli, [name], PA-C  aspirin EC 81 MG tablet Take 81 mg by mouth daily.    [provider]  atorvastatin (LIPITOR) 40 MG tablet Take 1 tablet (40 mg total) by mouth daily. 10/28/18 01/26/19  Angiulli, [name], PA-C  Bisacodyl (LAXATIVE PO) Take 1 tablet by mouth daily.    [provider]  clonazePAM (KLONOPIN) 0.5 MG tablet Take 0.5mg  tab every bedtime and 1 tab PRN for anxiety Patient taking differently: Take 0.5 mg by mouth See admin instructions. Take 0.5 mg by mouth at bedtime and an additional 0.5 mg once a day as needed for anxiety 10/28/18   Angiulli, [name], PA-C  DULoxetine (CYMBALTA) 30 MG capsule Take 30 mg by mouth every evening.     [provider]  DULoxetine (CYMBALTA) 60 MG capsule Take 60 mg by mouth every morning.     [provider]  famotidine (PEPCID) 20 MG tablet One at bedtime Patient taking differently: Take 20 mg by mouth daily as needed for heartburn or indigestion (or nausea).  02/14/16   Tanda Rockers, MD  lamoTRIgine (LAMICTAL) 150 MG tablet Take 1 tablet (150 mg total) by mouth 2 (two) times daily. Patient taking differently: Take 150 mg by mouth daily.  09/09/18   Cottle, Billey Co., MD  linaclotide [facility]) 145 MCG CAPS capsule Take 1 capsule (145 mcg total) by mouth daily before breakfast. Patient taking differently:  Take 145 mcg by mouth at bedtime.  10/28/18   Angiulli, [name], PA-C  losartan (COZAAR) 50 MG tablet Take 1 tablet (50 mg total) by mouth daily. 10/25/17   Rai, Ripudeep K, MD  ondansetron (ZOFRAN ODT) 4 MG disintegrating tablet Take 1 tablet (4 mg total) by mouth every 8 (eight) hours as needed for nausea or vomiting. 11/13/18   [name], MD  oxyCODONE-acetaminophen (PERCOCET/ROXICET) 5-325 MG tablet Take 1-2 tablets by mouth 2 (two) times daily as needed for moderate pain. Patient not taking: Reported on 11/13/2018 11/09/18   Kayleen Memos, DO  polyethylene glycol powder (GLYCOLAX/MIRALAX) powder Take 17 g by mouth every other day.    [provider]  rOPINIRole (REQUIP) 2 MG tablet TAKE 1 TABLET BY MOUTH AT BEDTIME Patient taking differently: Take 2 mg by mouth at bedtime.  05/27/12   Rise Mu, PA-C  sennosides-docusate sodium (SENOKOT-S)  8.6-50 MG tablet Take 1 tablet by mouth daily.    [provider]    Family History Family History  Problem Relation Age of Onset  . Breast cancer Mother        metastisis to bones  . Diabetes Son   . Heart disease Other        grandfather   . Alcohol abuse Brother   . Heart disease Brother   . Heart disease Maternal Aunt   . Lung cancer Brother        smoked  . Colon cancer Neg Hx     Social History Social History   Tobacco Use  . Smoking status: Current Every Day Smoker    Packs/day: 1.00    Years: 60.00    Pack years: 60.00    Types: Cigarettes  . Smokeless tobacco: Never Used  Substance Use Topics  . Alcohol use: Yes    Alcohol/week: 0.0 standard drinks    Comment: yes on occassion  . Drug use: No     Allergies   Patient has no known allergies.   Review of Systems Review of Systems  All other systems reviewed and are negative.    Physical Exam Updated Vital Signs BP (!) 149/73 (BP Location: Right Arm)   Pulse 81   Temp 98.9 F (37.2 C) (Oral)   Resp 16   SpO2 97%   Physical Exam Vitals  signs and nursing note reviewed.  Constitutional:      Appearance: She is well-developed.  HENT:     Head: Normocephalic and atraumatic.  Eyes:     Conjunctiva/sclera: Conjunctivae normal.     Pupils: Pupils are equal, round, and reactive to light.  Neck:     Musculoskeletal: Normal range of motion and neck supple.  Cardiovascular:     Rate and Rhythm: Normal rate and regular rhythm.     Heart sounds: No murmur. No friction rub. No gallop.      Comments: Intact distal pulses in bilateral LE with doppler Pulmonary:     Effort: Pulmonary effort is normal. No respiratory distress.     Breath sounds: Normal breath sounds. No wheezing or rales.  Chest:     Chest wall: No tenderness.  Abdominal:     General: Bowel sounds are normal. There is no distension.     Palpations: Abdomen is soft. There is no mass.     Tenderness: There is no abdominal tenderness. There is no guarding or rebound.  Musculoskeletal: Normal range of motion.        General: No tenderness.  Skin:    General: Skin is warm and dry.  Neurological:     Mental Status: She is alert and oriented to person, place, and time.  Psychiatric:        Behavior: Behavior normal.        Thought Content: Thought content normal.        Judgment: Judgment normal.        ED Treatments / Results  Labs (all labs ordered are listed, but only abnormal results are displayed) Labs Reviewed  COMPREHENSIVE METABOLIC PANEL - Abnormal; Notable for the following components:      Result Value   Sodium 133 (*)    Glucose, Bld 100 (*)    All other components within normal limits  LACTIC ACID, PLASMA  CBC WITH DIFFERENTIAL/PLATELET    EKG None  Radiology No results found.  Procedures Procedures (including critical care time)  Medications Ordered in ED  Medications - No data to display   Initial Impression / Assessment and Plan / ED Course  I have reviewed the triage vital signs and the nursing notes.  Pertinent labs &  imaging results that were available during my care of the patient were reviewed by me and considered in my medical decision making (see chart for details).       Here for wound check after prior incision and drainage.  Wounds appear to be well-healing.  There is no evidence of acute infection.  Intact distal pulses.  Patient seen by discussed with Dr. Leonides Schanz, who agrees that the patient can be discharged home.  We will redress the patient's wounds and advise her to follow-up with her surgeon.  Final Clinical Impressions(s) / ED Diagnoses   Final diagnoses:  Wound check, abscess    ED Discharge Orders    None       Montine Circle, PA-C 11/21/18 0041    Ward, Delice Bison, DO 11/21/18 0104

## 2018-11-20 NOTE — ED Triage Notes (Signed)
Pt here for evaluation of wounds to her left thigh from vascular surgery last month. Pt states green colored drainage from wounds. Denies fever.

## 2018-11-21 DIAGNOSIS — I700 Atherosclerosis of aorta: Secondary | ICD-10-CM | POA: Diagnosis not present

## 2018-11-21 DIAGNOSIS — D62 Acute posthemorrhagic anemia: Secondary | ICD-10-CM | POA: Diagnosis not present

## 2018-11-21 DIAGNOSIS — I739 Peripheral vascular disease, unspecified: Secondary | ICD-10-CM | POA: Diagnosis not present

## 2018-11-21 DIAGNOSIS — I714 Abdominal aortic aneurysm, without rupture: Secondary | ICD-10-CM | POA: Diagnosis not present

## 2018-11-21 DIAGNOSIS — Z48812 Encounter for surgical aftercare following surgery on the circulatory system: Secondary | ICD-10-CM | POA: Diagnosis not present

## 2018-11-21 DIAGNOSIS — I2510 Atherosclerotic heart disease of native coronary artery without angina pectoris: Secondary | ICD-10-CM | POA: Diagnosis not present

## 2018-11-21 NOTE — ED Notes (Signed)
Pt's wound packed and dressed

## 2018-11-25 ENCOUNTER — Encounter: Payer: Self-pay | Admitting: Family Medicine

## 2018-11-26 DIAGNOSIS — Z48812 Encounter for surgical aftercare following surgery on the circulatory system: Secondary | ICD-10-CM | POA: Diagnosis not present

## 2018-11-26 DIAGNOSIS — I700 Atherosclerosis of aorta: Secondary | ICD-10-CM | POA: Diagnosis not present

## 2018-11-26 DIAGNOSIS — I2510 Atherosclerotic heart disease of native coronary artery without angina pectoris: Secondary | ICD-10-CM | POA: Diagnosis not present

## 2018-11-26 DIAGNOSIS — I739 Peripheral vascular disease, unspecified: Secondary | ICD-10-CM | POA: Diagnosis not present

## 2018-11-26 DIAGNOSIS — D62 Acute posthemorrhagic anemia: Secondary | ICD-10-CM | POA: Diagnosis not present

## 2018-11-26 DIAGNOSIS — I714 Abdominal aortic aneurysm, without rupture: Secondary | ICD-10-CM | POA: Diagnosis not present

## 2018-11-26 NOTE — Telephone Encounter (Signed)
Please see Mychart message. We may need to coordinate a plan with vascular surgery for home health bandage changes. Not sure about options if needed 3 times per day. I do not think she should present here for bandage changes at this time. Let me know if new order needed.

## 2018-11-27 ENCOUNTER — Other Ambulatory Visit: Payer: Self-pay

## 2018-11-27 DIAGNOSIS — I739 Peripheral vascular disease, unspecified: Secondary | ICD-10-CM | POA: Diagnosis not present

## 2018-11-27 DIAGNOSIS — I700 Atherosclerosis of aorta: Secondary | ICD-10-CM | POA: Diagnosis not present

## 2018-11-27 DIAGNOSIS — D62 Acute posthemorrhagic anemia: Secondary | ICD-10-CM | POA: Diagnosis not present

## 2018-11-27 DIAGNOSIS — I2510 Atherosclerotic heart disease of native coronary artery without angina pectoris: Secondary | ICD-10-CM | POA: Diagnosis not present

## 2018-11-27 DIAGNOSIS — Z48812 Encounter for surgical aftercare following surgery on the circulatory system: Secondary | ICD-10-CM | POA: Diagnosis not present

## 2018-11-27 DIAGNOSIS — I714 Abdominal aortic aneurysm, without rupture: Secondary | ICD-10-CM | POA: Diagnosis not present

## 2018-11-27 MED ORDER — CLONAZEPAM 0.5 MG PO TABS: ORAL_TABLET | ORAL | 1 refills | Status: DC

## 2018-11-27 NOTE — Progress Notes (Signed)
Refill request from [facility] for Clonazepam 0.5mg  #20 1 refill per provider

## 2018-11-28 DIAGNOSIS — I700 Atherosclerosis of aorta: Secondary | ICD-10-CM | POA: Diagnosis not present

## 2018-11-28 DIAGNOSIS — D62 Acute posthemorrhagic anemia: Secondary | ICD-10-CM | POA: Diagnosis not present

## 2018-11-28 DIAGNOSIS — I2510 Atherosclerotic heart disease of native coronary artery without angina pectoris: Secondary | ICD-10-CM | POA: Diagnosis not present

## 2018-11-28 DIAGNOSIS — Z48812 Encounter for surgical aftercare following surgery on the circulatory system: Secondary | ICD-10-CM | POA: Diagnosis not present

## 2018-11-28 DIAGNOSIS — I714 Abdominal aortic aneurysm, without rupture: Secondary | ICD-10-CM | POA: Diagnosis not present

## 2018-11-28 DIAGNOSIS — I739 Peripheral vascular disease, unspecified: Secondary | ICD-10-CM | POA: Diagnosis not present

## 2018-11-29 ENCOUNTER — Ambulatory Visit: Payer: Medicare Other | Admitting: Cardiovascular Disease

## 2018-11-29 DIAGNOSIS — I714 Abdominal aortic aneurysm, without rupture: Secondary | ICD-10-CM | POA: Diagnosis not present

## 2018-11-29 DIAGNOSIS — Z48812 Encounter for surgical aftercare following surgery on the circulatory system: Secondary | ICD-10-CM | POA: Diagnosis not present

## 2018-11-29 DIAGNOSIS — I2510 Atherosclerotic heart disease of native coronary artery without angina pectoris: Secondary | ICD-10-CM | POA: Diagnosis not present

## 2018-11-29 DIAGNOSIS — I739 Peripheral vascular disease, unspecified: Secondary | ICD-10-CM | POA: Diagnosis not present

## 2018-11-29 DIAGNOSIS — I700 Atherosclerosis of aorta: Secondary | ICD-10-CM | POA: Diagnosis not present

## 2018-11-29 DIAGNOSIS — D62 Acute posthemorrhagic anemia: Secondary | ICD-10-CM | POA: Diagnosis not present

## 2018-11-30 DIAGNOSIS — F319 Bipolar disorder, unspecified: Secondary | ICD-10-CM | POA: Diagnosis not present

## 2018-11-30 DIAGNOSIS — I69398 Other sequelae of cerebral infarction: Secondary | ICD-10-CM | POA: Diagnosis not present

## 2018-11-30 DIAGNOSIS — K219 Gastro-esophageal reflux disease without esophagitis: Secondary | ICD-10-CM | POA: Diagnosis not present

## 2018-11-30 DIAGNOSIS — D630 Anemia in neoplastic disease: Secondary | ICD-10-CM | POA: Diagnosis not present

## 2018-11-30 DIAGNOSIS — I714 Abdominal aortic aneurysm, without rupture: Secondary | ICD-10-CM | POA: Diagnosis not present

## 2018-11-30 DIAGNOSIS — D329 Benign neoplasm of meninges, unspecified: Secondary | ICD-10-CM | POA: Diagnosis not present

## 2018-11-30 DIAGNOSIS — K5909 Other constipation: Secondary | ICD-10-CM | POA: Diagnosis not present

## 2018-11-30 DIAGNOSIS — G3184 Mild cognitive impairment, so stated: Secondary | ICD-10-CM | POA: Diagnosis not present

## 2018-11-30 DIAGNOSIS — M47815 Spondylosis without myelopathy or radiculopathy, thoracolumbar region: Secondary | ICD-10-CM | POA: Diagnosis not present

## 2018-11-30 DIAGNOSIS — T8149XA Infection following a procedure, other surgical site, initial encounter: Secondary | ICD-10-CM | POA: Diagnosis not present

## 2018-11-30 DIAGNOSIS — D62 Acute posthemorrhagic anemia: Secondary | ICD-10-CM | POA: Diagnosis not present

## 2018-11-30 DIAGNOSIS — I10 Essential (primary) hypertension: Secondary | ICD-10-CM | POA: Diagnosis not present

## 2018-11-30 DIAGNOSIS — E039 Hypothyroidism, unspecified: Secondary | ICD-10-CM | POA: Diagnosis not present

## 2018-11-30 DIAGNOSIS — M5186 Other intervertebral disc disorders, lumbar region: Secondary | ICD-10-CM | POA: Diagnosis not present

## 2018-11-30 DIAGNOSIS — G2581 Restless legs syndrome: Secondary | ICD-10-CM | POA: Diagnosis not present

## 2018-11-30 DIAGNOSIS — E785 Hyperlipidemia, unspecified: Secondary | ICD-10-CM | POA: Diagnosis not present

## 2018-11-30 DIAGNOSIS — I70212 Atherosclerosis of native arteries of extremities with intermittent claudication, left leg: Secondary | ICD-10-CM | POA: Diagnosis not present

## 2018-11-30 DIAGNOSIS — I69328 Other speech and language deficits following cerebral infarction: Secondary | ICD-10-CM | POA: Diagnosis not present

## 2018-11-30 DIAGNOSIS — I2510 Atherosclerotic heart disease of native coronary artery without angina pectoris: Secondary | ICD-10-CM | POA: Diagnosis not present

## 2018-11-30 DIAGNOSIS — J449 Chronic obstructive pulmonary disease, unspecified: Secondary | ICD-10-CM | POA: Diagnosis not present

## 2018-11-30 DIAGNOSIS — F411 Generalized anxiety disorder: Secondary | ICD-10-CM | POA: Diagnosis not present

## 2018-11-30 DIAGNOSIS — M4186 Other forms of scoliosis, lumbar region: Secondary | ICD-10-CM | POA: Diagnosis not present

## 2018-11-30 DIAGNOSIS — F17210 Nicotine dependence, cigarettes, uncomplicated: Secondary | ICD-10-CM | POA: Diagnosis not present

## 2018-11-30 DIAGNOSIS — I700 Atherosclerosis of aorta: Secondary | ICD-10-CM | POA: Diagnosis not present

## 2018-11-30 DIAGNOSIS — R531 Weakness: Secondary | ICD-10-CM | POA: Diagnosis not present

## 2018-12-02 DIAGNOSIS — I2510 Atherosclerotic heart disease of native coronary artery without angina pectoris: Secondary | ICD-10-CM | POA: Diagnosis not present

## 2018-12-02 DIAGNOSIS — I714 Abdominal aortic aneurysm, without rupture: Secondary | ICD-10-CM | POA: Diagnosis not present

## 2018-12-02 DIAGNOSIS — I70212 Atherosclerosis of native arteries of extremities with intermittent claudication, left leg: Secondary | ICD-10-CM | POA: Diagnosis not present

## 2018-12-02 DIAGNOSIS — D62 Acute posthemorrhagic anemia: Secondary | ICD-10-CM | POA: Diagnosis not present

## 2018-12-02 DIAGNOSIS — T8149XA Infection following a procedure, other surgical site, initial encounter: Secondary | ICD-10-CM | POA: Diagnosis not present

## 2018-12-02 DIAGNOSIS — I700 Atherosclerosis of aorta: Secondary | ICD-10-CM | POA: Diagnosis not present

## 2018-12-04 DIAGNOSIS — I700 Atherosclerosis of aorta: Secondary | ICD-10-CM | POA: Diagnosis not present

## 2018-12-04 DIAGNOSIS — I70212 Atherosclerosis of native arteries of extremities with intermittent claudication, left leg: Secondary | ICD-10-CM | POA: Diagnosis not present

## 2018-12-04 DIAGNOSIS — T8149XA Infection following a procedure, other surgical site, initial encounter: Secondary | ICD-10-CM | POA: Diagnosis not present

## 2018-12-04 DIAGNOSIS — I2510 Atherosclerotic heart disease of native coronary artery without angina pectoris: Secondary | ICD-10-CM | POA: Diagnosis not present

## 2018-12-04 DIAGNOSIS — I714 Abdominal aortic aneurysm, without rupture: Secondary | ICD-10-CM | POA: Diagnosis not present

## 2018-12-04 DIAGNOSIS — D62 Acute posthemorrhagic anemia: Secondary | ICD-10-CM | POA: Diagnosis not present

## 2018-12-05 ENCOUNTER — Ambulatory Visit: Payer: Medicare Other | Admitting: Family

## 2018-12-05 DIAGNOSIS — I70212 Atherosclerosis of native arteries of extremities with intermittent claudication, left leg: Secondary | ICD-10-CM | POA: Diagnosis not present

## 2018-12-05 DIAGNOSIS — D62 Acute posthemorrhagic anemia: Secondary | ICD-10-CM | POA: Diagnosis not present

## 2018-12-05 DIAGNOSIS — I700 Atherosclerosis of aorta: Secondary | ICD-10-CM | POA: Diagnosis not present

## 2018-12-05 DIAGNOSIS — T8149XA Infection following a procedure, other surgical site, initial encounter: Secondary | ICD-10-CM | POA: Diagnosis not present

## 2018-12-05 DIAGNOSIS — I2510 Atherosclerotic heart disease of native coronary artery without angina pectoris: Secondary | ICD-10-CM | POA: Diagnosis not present

## 2018-12-05 DIAGNOSIS — I714 Abdominal aortic aneurysm, without rupture: Secondary | ICD-10-CM | POA: Diagnosis not present

## 2018-12-06 ENCOUNTER — Ambulatory Visit (INDEPENDENT_AMBULATORY_CARE_PROVIDER_SITE_OTHER): Payer: Self-pay | Admitting: Family

## 2018-12-06 ENCOUNTER — Other Ambulatory Visit: Payer: Self-pay

## 2018-12-06 ENCOUNTER — Encounter: Payer: Self-pay | Admitting: Family

## 2018-12-06 VITALS — BP 108/76 | HR 68 | Temp 97.5°F | Resp 20 | Ht 68.0 in | Wt 163.0 lb

## 2018-12-06 DIAGNOSIS — R609 Edema, unspecified: Secondary | ICD-10-CM

## 2018-12-06 DIAGNOSIS — F17210 Nicotine dependence, cigarettes, uncomplicated: Secondary | ICD-10-CM

## 2018-12-06 DIAGNOSIS — I779 Disorder of arteries and arterioles, unspecified: Secondary | ICD-10-CM

## 2018-12-06 NOTE — Progress Notes (Signed)
[facility] [place]   CC: Follow up peripheral artery occlusive disease  History of Present Illness [name] is a 73 y.o. female who is s/p Left superficial femoral artery to below-knee popliteal artery bypass using non-reversed left greater saphenous vein on 10-22-18 by Dr. Oneida Alar for rest pain in left foot, which then required I&D of left thigh on 2028-20 by Dr. Oneida Alar for left thigh wound infection.   She denies feeling feverish, states she felt chilled after a shower and when she is downstairs in her home.  [name] is visiting and supervising dressing changes with silver alginate to the two open wounds at her left medial thigh incision.   She was seen in [facility] ED on 11-20-18. Note from ED provider: "Patient here for a wound check from recent femoropopliteal bypass of the left lower extremity.  She developed several abscesses that required incision and drainage.  These areas are healing.  She has good granulation tissue without purulent drainage.  No surrounding cellulitis.  Dopplerable pulses in this leg. Well-appearing without systemic symptoms.  Reassured patient and recommend follow-up with vascular surgery as scheduled.  She has wound care coming to the house twice a week.  No sign of infection today."    Diabetic: No Tobacco use: smoker  (1/3 ppd, started in her teens)  Pt meds include: Statin :Yes Betablocker: No ASA: Yes Other anticoagulants/antiplatelets: no   Past Medical History:  Diagnosis Date   AAA (abdominal aortic aneurysm) (HCC)    3.1 cm 07/08/18, 3 year follow-up recommended; possible 3 cm AAA by aortogram 09/13/18   Anemia    PMH   Appendicitis with abscess    07/08/18, s/p perc drain; resolved 07/30/18 by CT   Arthritis    Bipolar disorder ([place])    Cerebrovascular disease    intra and extracranial vascular dx per MRI 4/11, neurology rec strict CVRF control   Colonic inertia    Constipation    chronic;severe   Coronary  artery disease    Depression    Duodenitis    EKG abnormalities    changes, stress test neg (false EKG changes)   Gastritis    GERD (gastroesophageal reflux disease)    Hypertension    Hypothyroid 01/16/2014   Meningioma ([place]) 10/21/2013   PAD (peripheral artery disease) ([place])    Psoriasis    sees derm   Stroke [facility])    Wears dentures    Wears glasses     Social History Social History   Tobacco Use   Smoking status: Current Every Day Smoker    Packs/day: 1.00    Years: 60.00    Pack years: 60.00    Types: Cigarettes   Smokeless tobacco: Never Used  Substance Use Topics   Alcohol use: Yes    Alcohol/week: 0.0 standard drinks    Comment: yes on occassion   Drug use: No    Family History Family History  Problem Relation Age of Onset   Breast cancer Mother        metastisis to bones   Diabetes Son    Heart disease Other        grandfather    Alcohol abuse Brother    Heart disease Brother    Heart disease Maternal Aunt    Lung cancer Brother        smoked   Colon cancer Neg Hx     Past Surgical History:  Procedure Laterality Date   ABDOMINAL AORTOGRAM W/LOWER EXTREMITY N/A  09/13/2018   Procedure: ABDOMINAL AORTOGRAM W/LOWER EXTREMITY;  Surgeon: Elam Dutch, MD;  Location: [facility] CV [facility];  Service: Cardiovascular;  Laterality: N/A;   arthroscopy  04/2010   Right knee   CATARACT EXTRACTION W/ INTRAOCULAR LENS  IMPLANT, BILATERAL     COLONOSCOPY     FEMORAL-POPLITEAL BYPASS GRAFT  10/22/2018   FEMORAL-POPLITEAL BYPASS GRAFT Left 10/22/2018   Procedure: LEFT FEMORAL TO BELOW THE KNEE POPLITEAL ARTERY BYPASS GRAFT;  Surgeon: Elam Dutch, MD;  Location: [facility];  Service: Vascular;  Laterality: Left;   I&D EXTREMITY Left 11/08/2018   Procedure: IRRIGATION AND DEBRIDEMENT EXTREMITY Left Leg;  Surgeon: Elam Dutch, MD;  Location: [facility] OR;  Service: Vascular;  Laterality: Left;   IR RADIOLOGIST EVAL & MGMT  07/30/2018    MULTIPLE TOOTH EXTRACTIONS     TUBAL LIGATION      No Known Allergies  Current Outpatient Medications  Medication Sig Dispense Refill   amLODipine (NORVASC) 10 MG tablet Take 1 tablet (10 mg total) by mouth daily. 30 tablet 4   aspirin EC 81 MG tablet Take 81 mg by mouth daily.     atorvastatin (LIPITOR) 40 MG tablet Take 1 tablet (40 mg total) by mouth daily. 30 tablet 5   Bisacodyl (LAXATIVE PO) Take 1 tablet by mouth daily.     clonazePAM (KLONOPIN) 0.5 MG tablet Take 1 tab at bedtime and 1 tab PRN for anxiety 20 tablet 1   DULoxetine (CYMBALTA) 30 MG capsule Take 30 mg by mouth every evening.      DULoxetine (CYMBALTA) 60 MG capsule Take 60 mg by mouth every morning.      famotidine (PEPCID) 20 MG tablet One at bedtime (Patient taking differently: Take 20 mg by mouth daily as needed for heartburn or indigestion (or nausea). ) 30 tablet 11   lamoTRIgine (LAMICTAL) 150 MG tablet Take 1 tablet (150 mg total) by mouth 2 (two) times daily. (Patient taking differently: Take 150 mg by mouth daily. ) 180 tablet 0   linaclotide (LINZESS) 145 MCG CAPS capsule Take 1 capsule (145 mcg total) by mouth daily before breakfast. (Patient taking differently: Take 145 mcg by mouth at bedtime. ) 30 capsule 3   losartan (COZAAR) 50 MG tablet Take 1 tablet (50 mg total) by mouth daily. 30 tablet 4   ondansetron (ZOFRAN ODT) 4 MG disintegrating tablet Take 1 tablet (4 mg total) by mouth every 8 (eight) hours as needed for nausea or vomiting. 10 tablet 0   oxyCODONE-acetaminophen (PERCOCET/ROXICET) 5-325 MG tablet Take 1-2 tablets by mouth 2 (two) times daily as needed for moderate pain. 10 tablet 0   polyethylene glycol powder (GLYCOLAX/MIRALAX) powder Take 17 g by mouth every other day.     rOPINIRole (REQUIP) 2 MG tablet TAKE 1 TABLET BY MOUTH AT BEDTIME (Patient taking differently: Take 2 mg by mouth at bedtime. ) 30 tablet 0   sennosides-docusate sodium (SENOKOT-S) 8.6-50 MG tablet Take 1  tablet by mouth daily.     No current facility-administered medications for this visit.     ROS: See HPI for pertinent positives and negatives.   Physical Examination  Vitals:   12/06/18 1157  BP: 108/76  Pulse: 68  Resp: 20  Temp: (!) 97.5 F (36.4 C)  Weight: 163 lb (73.9 kg)  Height: 5\' 8"  (1.727 m)   Body mass index is 24.78 kg/m.  General: A&O x 3, WDWN, female. Gait: mild limp HENT: No gross abnormalities.  Eyes: PERRLA.  Pulmonary: Respirations are non labored Cardiac: regular rhythm, no detected murmur.      Adominal aortic pulse is not palpable                         VASCULAR EXAM: Extremities without ischemic changes, with Gangrene; with open wounds: see below photo. Left foot and lower leg with 1-2+ pitting edema. Left groin incision is well healed.    Left medial thigh: Deepest track at distal open thigh wound is 1 cm, at proximal open wound is 0.5 cm                                                                                                          LE Pulses Right Left       FEMORAL  2+ palpable  2+ palpable        POPLITEAL  not palpable   not palpable       POSTERIOR TIBIAL  not palpable, faint monophasic Doppler signals   not palpable, brisk biphasic Doppler signals        DORSALIS PEDIS      ANTERIOR TIBIAL not palpable, faint monophasic Doppler signals  not palpable, brisk monophasic Doppler signals    Abdomen: soft, NT, no palpable masses. Skin: no rashes, see Extremities. Musculoskeletal: no muscle wasting or atrophy.  Neurologic: A&O X 3; appropriate affect, Sensation is normal; MOTOR FUNCTION:  moving all extremities equally, motor strength 4/5 throughout. Speech is fluent/normal. CN 2-12 intact. Psychiatric: Thought content is normal, mood appropriate for clinical situation.     ASSESSMENT: [name] is a 73 y.o. female who is s/p Left superficial femoral artery to below-knee popliteal artery bypass using non-reversed  left greater saphenous vein on 10-22-18 by Dr. Oneida Alar for rest pain in left foot, which then required I&D of left thigh on 2028-20 by Dr. Oneida Alar for left thigh wound infection.    2 dehisced areas on left medial thigh incision, healing: There are no signs of infection in her left medial thigh incision. There are 2 open areas of the incision; both have healthy red appearing tissue at the base; deepest track at distal open thigh wound is 1 cm, at proximal open wound is 0.5 cm.  Dependent edema in left lower leg and foot: pt advised in proper elevation of feet and legs to minimize dependent edema, see Patient Instructions. Marland Kitchen    PLAN:  Based on the patient's HPI and examination, pt will return to clinic in 4 weeks for left leg wounds check, continue daily silver alginate dressing changes to open areas of left thigh incision by HH.  I advised her to gradually increase her walking.   Pt and HH to call for problems.   Over 3 minutes was spent counseling patient re smoking cessation, and patient was given several free resources re smoking cessation.  I discussed in depth with the patient the nature of atherosclerosis, and emphasized the importance of maximal medical management including strict control of blood pressure, blood glucose, and lipid levels, obtaining  regular exercise, and cessation of smoking.  The patient is aware that without maximal medical management the underlying atherosclerotic disease process will progress, limiting the benefit of any interventions.  The patient was given information about PAD including signs, symptoms, treatment, what symptoms should prompt the patient to seek immediate medical care, and risk reduction measures to take.  Clemon Chambers, RN, MSN, FNP-C [facility] Phone: [phone]  Clinic MD: Donzetta Matters  12/06/18 12:36 PM

## 2018-12-06 NOTE — Patient Instructions (Addendum)
Steps to Quit Smoking  Smoking tobacco can be bad for your health. It can also affect almost every organ in your body. Smoking puts you and people around you at risk for many serious long-lasting (chronic) diseases. Quitting smoking is hard, but it is one of the best things that you can do for your health. It is never too late to quit. What are the benefits of quitting smoking? When you quit smoking, you lower your risk for getting serious diseases and conditions. They can include:  Lung cancer or lung disease.  Heart disease.  Stroke.  Heart attack.  Not being able to have children (infertility).  Weak bones (osteoporosis) and broken bones (fractures). If you have coughing, wheezing, and shortness of breath, those symptoms may get better when you quit. You may also get sick less often. If you are pregnant, quitting smoking can help to lower your chances of having a baby of low birth weight. What can I do to help me quit smoking? Talk with your doctor about what can help you quit smoking. Some things you can do (strategies) include:  Quitting smoking totally, instead of slowly cutting back how much you smoke over a period of time.  Going to in-person counseling. You are more likely to quit if you go to many counseling sessions.  Using resources and support systems, such as: ? Online chats with a counselor. ? Phone quitlines. ? Printed self-help materials. ? Support groups or group counseling. ? Text messaging programs. ? Mobile phone apps or applications.  Taking medicines. Some of these medicines may have nicotine in them. If you are pregnant or breastfeeding, do not take any medicines to quit smoking unless your doctor says it is okay. Talk with your doctor about counseling or other things that can help you. Talk with your doctor about using more than one strategy at the same time, such as taking medicines while you are also going to in-person counseling. This can help make  quitting easier. What things can I do to make it easier to quit? Quitting smoking might feel very hard at first, but there is a lot that you can do to make it easier. Take these steps:  Talk to your family and friends. Ask them to support and encourage you.  Call phone quitlines, reach out to support groups, or work with a counselor.  Ask people who smoke to not smoke around you.  Avoid places that make you want (trigger) to smoke, such as: ? Bars. ? Parties. ? Smoke-break areas at work.  Spend time with people who do not smoke.  Lower the stress in your life. Stress can make you want to smoke. Try these things to help your stress: ? Getting regular exercise. ? Deep-breathing exercises. ? Yoga. ? Meditating. ? Doing a body scan. To do this, close your eyes, focus on one area of your body at a time from head to toe, and notice which parts of your body are tense. Try to relax the muscles in those areas.  Download or buy apps on your mobile phone or tablet that can help you stick to your quit plan. There are many free apps, such as QuitGuide from the CDC (Centers for Disease Control and Prevention). You can find more support from smokefree.gov and other websites. This information is not intended to replace advice given to you by your health care provider. Make sure you discuss any questions you have with your health care provider. Document Released: 06/24/2009 Document Revised: 04/25/2016   Document Reviewed: 01/12/2015 Elsevier Interactive Patient Education  2019 [place].     Peripheral Vascular Disease  Peripheral vascular disease (PVD) is a disease of the blood vessels that are not part of your heart and brain. A simple term for PVD is poor circulation. In most cases, PVD narrows the blood vessels that carry blood from your heart to the rest of your body. This can reduce the supply of blood to your arms, legs, and internal organs, like your stomach or kidneys. However, PVD most  often affects a person's lower legs and feet. Without treatment, PVD tends to get worse. PVD can also lead to acute ischemic limb. This is when an arm or leg suddenly cannot get enough blood. This is a medical emergency. Follow these instructions at home: Lifestyle  Do not use any products that contain nicotine or tobacco, such as cigarettes and e-cigarettes. If you need help quitting, ask your doctor.  Lose weight if you are overweight. Or, stay at a healthy weight as told by your doctor.  Eat a diet that is low in fat and cholesterol. If you need help, ask your doctor.  Exercise regularly. Ask your doctor for activities that are right for you. General instructions  Take over-the-counter and prescription medicines only as told by your doctor.  Take good care of your feet: ? Wear comfortable shoes that fit well. ? Check your feet often for any cuts or sores.  Keep all follow-up visits as told by your doctor This is important. Contact a doctor if:  You have cramps in your legs when you walk.  You have leg pain when you are at rest.  You have coldness in a leg or foot.  Your skin changes.  You are unable to get or have an erection (erectile dysfunction).  You have cuts or sores on your feet that do not heal. Get help right away if:  Your arm or leg turns cold, numb, and blue.  Your arms or legs become red, warm, swollen, painful, or numb.  You have chest pain.  You have trouble breathing.  You suddenly have weakness in your face, arm, or leg.  You become very confused or you cannot speak.  You suddenly have a very bad headache.  You suddenly cannot see. Summary  Peripheral vascular disease (PVD) is a disease of the blood vessels.  A simple term for PVD is poor circulation. Without treatment, PVD tends to get worse.  Treatment may include exercise, low fat and low cholesterol diet, and quitting smoking. This information is not intended to replace advice given to  you by your health care provider. Make sure you discuss any questions you have with your health care provider. Document Released: 11/22/2009 Document Revised: 10/05/2016 Document Reviewed: 10/05/2016 Elsevier Interactive Patient Education  2019 [employer].    To decrease swelling in your feet and legs: Elevate feet above slightly bent knees, feet above heart, overnight and 3-4 times per day for 20 minutes.

## 2018-12-09 DIAGNOSIS — I714 Abdominal aortic aneurysm, without rupture: Secondary | ICD-10-CM | POA: Diagnosis not present

## 2018-12-09 DIAGNOSIS — I70212 Atherosclerosis of native arteries of extremities with intermittent claudication, left leg: Secondary | ICD-10-CM | POA: Diagnosis not present

## 2018-12-09 DIAGNOSIS — T8149XA Infection following a procedure, other surgical site, initial encounter: Secondary | ICD-10-CM | POA: Diagnosis not present

## 2018-12-09 DIAGNOSIS — I700 Atherosclerosis of aorta: Secondary | ICD-10-CM | POA: Diagnosis not present

## 2018-12-09 DIAGNOSIS — I2510 Atherosclerotic heart disease of native coronary artery without angina pectoris: Secondary | ICD-10-CM | POA: Diagnosis not present

## 2018-12-09 DIAGNOSIS — D62 Acute posthemorrhagic anemia: Secondary | ICD-10-CM | POA: Diagnosis not present

## 2018-12-11 DIAGNOSIS — D62 Acute posthemorrhagic anemia: Secondary | ICD-10-CM | POA: Diagnosis not present

## 2018-12-11 DIAGNOSIS — I2510 Atherosclerotic heart disease of native coronary artery without angina pectoris: Secondary | ICD-10-CM | POA: Diagnosis not present

## 2018-12-11 DIAGNOSIS — I70212 Atherosclerosis of native arteries of extremities with intermittent claudication, left leg: Secondary | ICD-10-CM | POA: Diagnosis not present

## 2018-12-11 DIAGNOSIS — I700 Atherosclerosis of aorta: Secondary | ICD-10-CM | POA: Diagnosis not present

## 2018-12-11 DIAGNOSIS — I714 Abdominal aortic aneurysm, without rupture: Secondary | ICD-10-CM | POA: Diagnosis not present

## 2018-12-11 DIAGNOSIS — T8149XA Infection following a procedure, other surgical site, initial encounter: Secondary | ICD-10-CM | POA: Diagnosis not present

## 2018-12-12 DIAGNOSIS — I2510 Atherosclerotic heart disease of native coronary artery without angina pectoris: Secondary | ICD-10-CM | POA: Diagnosis not present

## 2018-12-12 DIAGNOSIS — I714 Abdominal aortic aneurysm, without rupture: Secondary | ICD-10-CM | POA: Diagnosis not present

## 2018-12-12 DIAGNOSIS — I700 Atherosclerosis of aorta: Secondary | ICD-10-CM | POA: Diagnosis not present

## 2018-12-12 DIAGNOSIS — T8149XA Infection following a procedure, other surgical site, initial encounter: Secondary | ICD-10-CM | POA: Diagnosis not present

## 2018-12-12 DIAGNOSIS — D62 Acute posthemorrhagic anemia: Secondary | ICD-10-CM | POA: Diagnosis not present

## 2018-12-12 DIAGNOSIS — I70212 Atherosclerosis of native arteries of extremities with intermittent claudication, left leg: Secondary | ICD-10-CM | POA: Diagnosis not present

## 2018-12-13 DIAGNOSIS — I2510 Atherosclerotic heart disease of native coronary artery without angina pectoris: Secondary | ICD-10-CM | POA: Diagnosis not present

## 2018-12-13 DIAGNOSIS — I714 Abdominal aortic aneurysm, without rupture: Secondary | ICD-10-CM | POA: Diagnosis not present

## 2018-12-13 DIAGNOSIS — I700 Atherosclerosis of aorta: Secondary | ICD-10-CM | POA: Diagnosis not present

## 2018-12-13 DIAGNOSIS — I70212 Atherosclerosis of native arteries of extremities with intermittent claudication, left leg: Secondary | ICD-10-CM | POA: Diagnosis not present

## 2018-12-13 DIAGNOSIS — T8149XA Infection following a procedure, other surgical site, initial encounter: Secondary | ICD-10-CM | POA: Diagnosis not present

## 2018-12-13 DIAGNOSIS — D62 Acute posthemorrhagic anemia: Secondary | ICD-10-CM | POA: Diagnosis not present

## 2018-12-16 DIAGNOSIS — I714 Abdominal aortic aneurysm, without rupture: Secondary | ICD-10-CM | POA: Diagnosis not present

## 2018-12-16 DIAGNOSIS — T8149XA Infection following a procedure, other surgical site, initial encounter: Secondary | ICD-10-CM | POA: Diagnosis not present

## 2018-12-16 DIAGNOSIS — I2510 Atherosclerotic heart disease of native coronary artery without angina pectoris: Secondary | ICD-10-CM | POA: Diagnosis not present

## 2018-12-16 DIAGNOSIS — I700 Atherosclerosis of aorta: Secondary | ICD-10-CM | POA: Diagnosis not present

## 2018-12-16 DIAGNOSIS — D62 Acute posthemorrhagic anemia: Secondary | ICD-10-CM | POA: Diagnosis not present

## 2018-12-16 DIAGNOSIS — I70212 Atherosclerosis of native arteries of extremities with intermittent claudication, left leg: Secondary | ICD-10-CM | POA: Diagnosis not present

## 2018-12-18 DIAGNOSIS — T8149XA Infection following a procedure, other surgical site, initial encounter: Secondary | ICD-10-CM | POA: Diagnosis not present

## 2018-12-18 DIAGNOSIS — I714 Abdominal aortic aneurysm, without rupture: Secondary | ICD-10-CM | POA: Diagnosis not present

## 2018-12-18 DIAGNOSIS — I700 Atherosclerosis of aorta: Secondary | ICD-10-CM | POA: Diagnosis not present

## 2018-12-18 DIAGNOSIS — D62 Acute posthemorrhagic anemia: Secondary | ICD-10-CM | POA: Diagnosis not present

## 2018-12-18 DIAGNOSIS — I70212 Atherosclerosis of native arteries of extremities with intermittent claudication, left leg: Secondary | ICD-10-CM | POA: Diagnosis not present

## 2018-12-18 DIAGNOSIS — I2510 Atherosclerotic heart disease of native coronary artery without angina pectoris: Secondary | ICD-10-CM | POA: Diagnosis not present

## 2018-12-19 DIAGNOSIS — I2510 Atherosclerotic heart disease of native coronary artery without angina pectoris: Secondary | ICD-10-CM | POA: Diagnosis not present

## 2018-12-19 DIAGNOSIS — D62 Acute posthemorrhagic anemia: Secondary | ICD-10-CM | POA: Diagnosis not present

## 2018-12-19 DIAGNOSIS — I714 Abdominal aortic aneurysm, without rupture: Secondary | ICD-10-CM | POA: Diagnosis not present

## 2018-12-19 DIAGNOSIS — I700 Atherosclerosis of aorta: Secondary | ICD-10-CM | POA: Diagnosis not present

## 2018-12-19 DIAGNOSIS — T8149XA Infection following a procedure, other surgical site, initial encounter: Secondary | ICD-10-CM | POA: Diagnosis not present

## 2018-12-19 DIAGNOSIS — I70212 Atherosclerosis of native arteries of extremities with intermittent claudication, left leg: Secondary | ICD-10-CM | POA: Diagnosis not present

## 2018-12-20 DIAGNOSIS — I2510 Atherosclerotic heart disease of native coronary artery without angina pectoris: Secondary | ICD-10-CM | POA: Diagnosis not present

## 2018-12-20 DIAGNOSIS — I70212 Atherosclerosis of native arteries of extremities with intermittent claudication, left leg: Secondary | ICD-10-CM | POA: Diagnosis not present

## 2018-12-20 DIAGNOSIS — I700 Atherosclerosis of aorta: Secondary | ICD-10-CM | POA: Diagnosis not present

## 2018-12-20 DIAGNOSIS — I714 Abdominal aortic aneurysm, without rupture: Secondary | ICD-10-CM | POA: Diagnosis not present

## 2018-12-20 DIAGNOSIS — D62 Acute posthemorrhagic anemia: Secondary | ICD-10-CM | POA: Diagnosis not present

## 2018-12-20 DIAGNOSIS — T8149XA Infection following a procedure, other surgical site, initial encounter: Secondary | ICD-10-CM | POA: Diagnosis not present

## 2018-12-23 ENCOUNTER — Telehealth: Payer: Self-pay | Admitting: *Deleted

## 2018-12-23 ENCOUNTER — Ambulatory Visit: Payer: Self-pay

## 2018-12-23 DIAGNOSIS — T8149XA Infection following a procedure, other surgical site, initial encounter: Secondary | ICD-10-CM | POA: Diagnosis not present

## 2018-12-23 DIAGNOSIS — I70212 Atherosclerosis of native arteries of extremities with intermittent claudication, left leg: Secondary | ICD-10-CM | POA: Diagnosis not present

## 2018-12-23 DIAGNOSIS — I714 Abdominal aortic aneurysm, without rupture: Secondary | ICD-10-CM | POA: Diagnosis not present

## 2018-12-23 DIAGNOSIS — I2510 Atherosclerotic heart disease of native coronary artery without angina pectoris: Secondary | ICD-10-CM | POA: Diagnosis not present

## 2018-12-23 DIAGNOSIS — D62 Acute posthemorrhagic anemia: Secondary | ICD-10-CM | POA: Diagnosis not present

## 2018-12-23 DIAGNOSIS — I700 Atherosclerosis of aorta: Secondary | ICD-10-CM | POA: Diagnosis not present

## 2018-12-23 NOTE — Telephone Encounter (Signed)
Dr. Carlota Raspberry,   I was speaking to [name] today to cancel her AWV and she stated her BP has been running at 160/82 for the past 5 days,  She stated she has been taking her medicine everyday.   She doesn't have any other symptoms and it has been running at 120 to 130/80 the  week before.   She is on Norvasc but you didn't prescribe this to her the computer says [name]. [place] [facility] (patient said she couldn't remember who had prescribed it.   You are her Primary care and saw her on 11/13/2018 .

## 2018-12-24 ENCOUNTER — Encounter: Payer: Medicare Other | Admitting: Psychiatry

## 2018-12-24 ENCOUNTER — Ambulatory Visit: Payer: Medicare Other | Admitting: Psychiatry

## 2018-12-24 ENCOUNTER — Encounter: Payer: Self-pay | Admitting: Psychiatry

## 2018-12-24 ENCOUNTER — Other Ambulatory Visit: Payer: Self-pay

## 2018-12-24 NOTE — Telephone Encounter (Signed)
Blood pressure looked okay at her last visit, and was borderline low on March [facility].  Has she accidentally missed any doses of meds?  Would also want to make sure she is obtaining appropriate reading with her home monitor.  Try checking on each arm, and should be resting for 15 minutes prior to checking reading, and should not have smoked a cigarette within 30 minutes of checking reading either.  Let me know what she obtains with this approach.

## 2018-12-24 NOTE — Progress Notes (Signed)
This encounter was created in error - please disregard.

## 2018-12-25 ENCOUNTER — Telehealth (HOSPITAL_COMMUNITY): Payer: Self-pay | Admitting: Rehabilitation

## 2018-12-25 ENCOUNTER — Other Ambulatory Visit: Payer: Self-pay | Admitting: Psychiatry

## 2018-12-25 DIAGNOSIS — I700 Atherosclerosis of aorta: Secondary | ICD-10-CM | POA: Diagnosis not present

## 2018-12-25 DIAGNOSIS — I70212 Atherosclerosis of native arteries of extremities with intermittent claudication, left leg: Secondary | ICD-10-CM | POA: Diagnosis not present

## 2018-12-25 DIAGNOSIS — D62 Acute posthemorrhagic anemia: Secondary | ICD-10-CM | POA: Diagnosis not present

## 2018-12-25 DIAGNOSIS — I714 Abdominal aortic aneurysm, without rupture: Secondary | ICD-10-CM | POA: Diagnosis not present

## 2018-12-25 DIAGNOSIS — T8149XA Infection following a procedure, other surgical site, initial encounter: Secondary | ICD-10-CM | POA: Diagnosis not present

## 2018-12-25 DIAGNOSIS — I2510 Atherosclerotic heart disease of native coronary artery without angina pectoris: Secondary | ICD-10-CM | POA: Diagnosis not present

## 2018-12-25 NOTE — Telephone Encounter (Signed)
The above patient or their representative was contacted and gave the following answers to these questions:         Do you have any of the following symptoms? No  Fever                    Cough                   Shortness of breath  Do  you have any of the following other symptoms? No   muscle pain         vomiting,        diarrhea        rash         weakness        red eye        abdominal pain         bruising          bruising or bleeding              joint pain           severe headache    Have you been in contact with someone who was or has been sick in the past 2 weeks? No  Yes                 Unsure                         Unable to assess   Does the person that you were in contact with have any of the following symptoms? n/a  Cough         shortness of breath           muscle pain         vomiting,            diarrhea            rash            weakness           fever            red eye           abdominal pain           bruising  or  bleeding                joint pain                severe headache               Have you  or someone you have been in contact with traveled internationally in th last month? No         If yes, which countries? n/a   Have you  or someone you have been in contact with traveled outside [place] in th last month? No        If yes, which state and city? n/a   COMMENTS OR ACTION PLAN FOR THIS PATIENT:          

## 2018-12-26 ENCOUNTER — Ambulatory Visit (INDEPENDENT_AMBULATORY_CARE_PROVIDER_SITE_OTHER): Payer: Self-pay | Admitting: Vascular Surgery

## 2018-12-26 ENCOUNTER — Encounter: Payer: Self-pay | Admitting: Vascular Surgery

## 2018-12-26 ENCOUNTER — Encounter: Payer: Self-pay | Admitting: *Deleted

## 2018-12-26 ENCOUNTER — Other Ambulatory Visit: Payer: Self-pay

## 2018-12-26 ENCOUNTER — Telehealth: Payer: Self-pay | Admitting: Vascular Surgery

## 2018-12-26 VITALS — BP 125/68 | HR 71 | Resp 20 | Ht 68.0 in

## 2018-12-26 DIAGNOSIS — I739 Peripheral vascular disease, unspecified: Secondary | ICD-10-CM

## 2018-12-26 MED ORDER — DULOXETINE HCL 30 MG PO CPEP: 30.0000 mg | ORAL_CAPSULE | Freq: Every evening | ORAL | 3 refills | Status: DC

## 2018-12-26 NOTE — Telephone Encounter (Signed)
Sent mychart message to patient

## 2018-12-26 NOTE — Progress Notes (Signed)
Patient is a 73 year old female who returns for postoperative follow-up today.  She recently underwent left superficial femoral to below-knee popliteal bypass with vein October 22, 2018.  She had some difficulty healing up some of her saphenectomy incisions.  She returns today for further follow-up.  She denies any claudication symptoms in the left leg.  She has no incisional drainage.  She has no fever or chills.  Physical exam:  Vitals:   12/26/18 0916  BP: 125/68  Pulse: 71  Resp: 20  SpO2: 96%  Height: 5\' 8"  (1.727 m)    Left lower extremity: 2+ popliteal pulse foot pink warm well-perfused all incisions essentially healed at this point  Assessment: Doing well status post left superficial femoral to below-knee popliteal bypass with vein currently asymptomatic  Plan: The patient will follow-up in 3 months for a graft duplex and ABIs.  She will see our nurse practitioner at that office visit.  Ruta Hinds, MD [facility] Office: [phone] Pager: [phone]

## 2018-12-26 NOTE — Telephone Encounter (Signed)
Pt wanted to know when her Home Health ended and if she could extend it.  I reached out to [facility] [phone].  A nurse is scheduled to visit pts home tomorrow 4/17.  They will re-evaluated patient's needs at that time and reach out to our office if needed.   Pt verbalized understanding.   Thurston Hole., LPN

## 2018-12-27 ENCOUNTER — Ambulatory Visit (INDEPENDENT_AMBULATORY_CARE_PROVIDER_SITE_OTHER): Payer: Medicare Other | Admitting: Psychiatry

## 2018-12-27 DIAGNOSIS — I714 Abdominal aortic aneurysm, without rupture: Secondary | ICD-10-CM | POA: Diagnosis not present

## 2018-12-27 DIAGNOSIS — I700 Atherosclerosis of aorta: Secondary | ICD-10-CM | POA: Diagnosis not present

## 2018-12-27 DIAGNOSIS — I2510 Atherosclerotic heart disease of native coronary artery without angina pectoris: Secondary | ICD-10-CM | POA: Diagnosis not present

## 2018-12-27 DIAGNOSIS — F411 Generalized anxiety disorder: Secondary | ICD-10-CM

## 2018-12-27 DIAGNOSIS — G3184 Mild cognitive impairment, so stated: Secondary | ICD-10-CM

## 2018-12-27 DIAGNOSIS — D62 Acute posthemorrhagic anemia: Secondary | ICD-10-CM | POA: Diagnosis not present

## 2018-12-27 DIAGNOSIS — I70212 Atherosclerosis of native arteries of extremities with intermittent claudication, left leg: Secondary | ICD-10-CM | POA: Diagnosis not present

## 2018-12-27 DIAGNOSIS — T8149XA Infection following a procedure, other surgical site, initial encounter: Secondary | ICD-10-CM | POA: Diagnosis not present

## 2018-12-27 NOTE — Progress Notes (Deleted)
Psychotherapy Progress Note [facility] Luan Moore, PhD LP  Patient ID: IDALYS KONECNY     MRN: [id number]     Therapy format: {Therapy Types:21967::"Individual psychotherapy"} Date: 12/27/2018     Start: ***:*** Stop: ***:*** Time Spent: *** min  I connected with patient by Jackquline [place], with her informed consent, and verified patient privacy and identity.  I was located at home and patient at ***.  Session narrative -- presenting needs, interim history, self-report of stressors and symptoms, applications of prior therapy, status changes, and interventions made in session ***  Therapeutic modalities: {PSY:[phone]}  Mental Status/Observations:  Appearance:   {PSY:22683}     Behavior:  {PSY:21022743}  Motor:  {PSY:22302}  Speech/Language:   {PSY:22685}  Affect:  {PSY:22687}  Mood:  {PSY:31886}  Thought process:  {PSY:31888}  Thought content:    {PSY:[phone]}  Sensory/Perceptual disturbances:    {[phone]}  Orientation:  {PSY:30297}  Attention:  {PSY:22877}  Concentration:  {PSY:([phone]}  Memory:  {PSY:386-716-7400}  Insight:    {PSY:([phone] 579-3112}  Judgment:   {PSY:([phone] 579-3112}  Impulse Control:  {PSY:([phone] [phone]}   Risk Assessment: Danger to Self:  {PSY:22692} Self-injurious Behavior: {PSY:22692} Danger to Others: {PSY:22692} Duty to Warn:{PSY:311194} Physical Aggression / Violence:{PSY:21197} Access to Firearms a concern: {PSY:21197}  Diagnosis: No diagnosis found. Assessment of progress:  {AM:22147}  Plan:  . *** . Other recommendations/advice as noted above . Continue to utilize previously learned skills ad lib . Maintain medication as prescribed and work faithfully with relevant prescriber(s) if any changes are desired or seem indicated . Call the clinic on-call service, present to ER, or call 911 if any life-threatening psychiatric crisis . No follow-ups on file.   Blanchie Serve, PhD [place] Licensed Psychologist

## 2018-12-30 ENCOUNTER — Telehealth: Payer: Medicare Other | Admitting: Family Medicine

## 2018-12-30 ENCOUNTER — Other Ambulatory Visit: Payer: Self-pay

## 2018-12-30 DIAGNOSIS — I10 Essential (primary) hypertension: Secondary | ICD-10-CM | POA: Diagnosis not present

## 2018-12-30 DIAGNOSIS — G3184 Mild cognitive impairment, so stated: Secondary | ICD-10-CM | POA: Diagnosis not present

## 2018-12-30 DIAGNOSIS — M4186 Other forms of scoliosis, lumbar region: Secondary | ICD-10-CM | POA: Diagnosis not present

## 2018-12-30 DIAGNOSIS — R531 Weakness: Secondary | ICD-10-CM | POA: Diagnosis not present

## 2018-12-30 DIAGNOSIS — T8142XD Infection following a procedure, deep incisional surgical site, subsequent encounter: Secondary | ICD-10-CM | POA: Diagnosis not present

## 2018-12-30 DIAGNOSIS — E039 Hypothyroidism, unspecified: Secondary | ICD-10-CM | POA: Diagnosis not present

## 2018-12-30 DIAGNOSIS — I69398 Other sequelae of cerebral infarction: Secondary | ICD-10-CM | POA: Diagnosis not present

## 2018-12-30 DIAGNOSIS — J449 Chronic obstructive pulmonary disease, unspecified: Secondary | ICD-10-CM | POA: Diagnosis not present

## 2018-12-30 DIAGNOSIS — K5909 Other constipation: Secondary | ICD-10-CM | POA: Diagnosis not present

## 2018-12-30 DIAGNOSIS — E785 Hyperlipidemia, unspecified: Secondary | ICD-10-CM | POA: Diagnosis not present

## 2018-12-30 DIAGNOSIS — M5186 Other intervertebral disc disorders, lumbar region: Secondary | ICD-10-CM | POA: Diagnosis not present

## 2018-12-30 DIAGNOSIS — J8410 Pulmonary fibrosis, unspecified: Secondary | ICD-10-CM | POA: Diagnosis not present

## 2018-12-30 DIAGNOSIS — I2510 Atherosclerotic heart disease of native coronary artery without angina pectoris: Secondary | ICD-10-CM | POA: Diagnosis not present

## 2018-12-30 DIAGNOSIS — M47815 Spondylosis without myelopathy or radiculopathy, thoracolumbar region: Secondary | ICD-10-CM | POA: Diagnosis not present

## 2018-12-30 DIAGNOSIS — G8929 Other chronic pain: Secondary | ICD-10-CM | POA: Diagnosis not present

## 2018-12-30 DIAGNOSIS — I700 Atherosclerosis of aorta: Secondary | ICD-10-CM | POA: Diagnosis not present

## 2018-12-30 DIAGNOSIS — I69328 Other speech and language deficits following cerebral infarction: Secondary | ICD-10-CM | POA: Diagnosis not present

## 2018-12-30 DIAGNOSIS — E46 Unspecified protein-calorie malnutrition: Secondary | ICD-10-CM | POA: Diagnosis not present

## 2018-12-30 DIAGNOSIS — G479 Sleep disorder, unspecified: Secondary | ICD-10-CM | POA: Diagnosis not present

## 2018-12-30 DIAGNOSIS — I714 Abdominal aortic aneurysm, without rupture: Secondary | ICD-10-CM | POA: Diagnosis not present

## 2018-12-30 DIAGNOSIS — M5442 Lumbago with sciatica, left side: Secondary | ICD-10-CM | POA: Diagnosis not present

## 2018-12-30 DIAGNOSIS — F319 Bipolar disorder, unspecified: Secondary | ICD-10-CM | POA: Diagnosis not present

## 2018-12-30 DIAGNOSIS — F17210 Nicotine dependence, cigarettes, uncomplicated: Secondary | ICD-10-CM | POA: Diagnosis not present

## 2018-12-30 DIAGNOSIS — G2581 Restless legs syndrome: Secondary | ICD-10-CM | POA: Diagnosis not present

## 2018-12-30 DIAGNOSIS — F411 Generalized anxiety disorder: Secondary | ICD-10-CM | POA: Diagnosis not present

## 2018-12-30 NOTE — Progress Notes (Signed)
Admin note for No-show or Short Notice Cancellation  Patient ID: [name]  MRN: [id number] DATE: 12/27/2018  No show for 3pm teletherapy appointment.  Attempted to call PT from [employer] audio andhome number (blocked for privacy) without success, and email invitation to videoconference unheeded.  Resorted to directly texting her cell phone to explain but not responded to for most of an hour.  PT reported to have called the office after the end of her appointment time to reschedule.  Briefly engaged in text and successfully engaged for next week without penalty.  Blanchie Serve, PhD

## 2019-01-01 ENCOUNTER — Other Ambulatory Visit: Payer: Self-pay

## 2019-01-01 ENCOUNTER — Ambulatory Visit (INDEPENDENT_AMBULATORY_CARE_PROVIDER_SITE_OTHER): Payer: Medicare Other | Admitting: Psychiatry

## 2019-01-01 DIAGNOSIS — I69328 Other speech and language deficits following cerebral infarction: Secondary | ICD-10-CM | POA: Diagnosis not present

## 2019-01-01 DIAGNOSIS — G4721 Circadian rhythm sleep disorder, delayed sleep phase type: Secondary | ICD-10-CM

## 2019-01-01 DIAGNOSIS — I714 Abdominal aortic aneurysm, without rupture: Secondary | ICD-10-CM | POA: Diagnosis not present

## 2019-01-01 DIAGNOSIS — Z634 Disappearance and death of family member: Secondary | ICD-10-CM

## 2019-01-01 DIAGNOSIS — F411 Generalized anxiety disorder: Secondary | ICD-10-CM | POA: Diagnosis not present

## 2019-01-01 DIAGNOSIS — Z636 Dependent relative needing care at home: Secondary | ICD-10-CM | POA: Diagnosis not present

## 2019-01-01 DIAGNOSIS — I2510 Atherosclerotic heart disease of native coronary artery without angina pectoris: Secondary | ICD-10-CM | POA: Diagnosis not present

## 2019-01-01 DIAGNOSIS — I10 Essential (primary) hypertension: Secondary | ICD-10-CM | POA: Diagnosis not present

## 2019-01-01 DIAGNOSIS — F331 Major depressive disorder, recurrent, moderate: Secondary | ICD-10-CM | POA: Diagnosis not present

## 2019-01-01 DIAGNOSIS — T8142XD Infection following a procedure, deep incisional surgical site, subsequent encounter: Secondary | ICD-10-CM | POA: Diagnosis not present

## 2019-01-01 DIAGNOSIS — I700 Atherosclerosis of aorta: Secondary | ICD-10-CM | POA: Diagnosis not present

## 2019-01-01 NOTE — Progress Notes (Signed)
Psychotherapy Progress Note [facility] Luan Moore, PhD LP  Patient ID: SHANEQUA WHITENIGHT     MRN: [id number]     Therapy format: Individual psychotherapy Date: 01/01/2019     Start: 1:17p Stop: 2:18p Time Spent: 61 min  I connected with patient by a video enabled telemedicine application or telephone, with her informed consent, and verified patient privacy and that I am speaking with the correct person using two identifiers.  I was located at home and patient at home.  Pt was unable to establish her own video signal, though she could see my video feed..  Consents to risk denial of charge by Medicare on faith her BCBS supplement will honor audio-only as teletherapy.  Session narrative -- presenting needs, interim history, self-report of stressors and symptoms, applications of prior therapy, status changes, and interventions made in session [name] died -- found his body on the floor, eyes open. Cold temp, must have been down for a few hours.  Haunted some by that.  She was cooking dinner last Friday, giving him room to nap as she figured, put his portion in the fridge, decided not to wake him up, deferring to his usual irritability.   Sorrowful about the loss of good times, despite the last several years of strife.  Only noted him worse in dementia and losing weight over time.  Hx of subdural hematoma.  Best assessment he died before falling over, probable aneurysm.  Working English as a second language teacher, worrying about creditors.  Cremation decided.  [name]'s will, 2002, in secret, specifies his kids as 100% heirs, but his [employer] account has Pt as sole beneficiary and she is on the deed to the house.  [name]'s daughter Syrah is executor, currently in the house, getting along amicably, though they differ as to who gets the house.  Private atty says she has inalienable rights that supercede the will.  Encouraged to keep the high road, redirect all conflict to attorney's advice and strike a  collaborative posture at all times.    Son [name] is in the house now, too, in support of Pt.  Of course, Pt lost sister 8 months ago, she lost her longtime lover Jenny Reichmann a few months before that.  Recent dream of John as a Designer, industrial/product (he used to play the role), bringing Suzie with him to come get her and drive her to her eternal home (hopeful, not suicidal).  Recently had vascular surgery to correct a 50% blockage in her leg, had complications (infection), incisions barely closed up now after 3 months convalescing.    Able to relate that she met [name] at his most vulnerable time, abandoned by his prior wife, and she was financially independent, and in recent times he was tearful fairly frequently, she was able to comfort him, with hugs and kind words.  Easter Sunday, under COVID quarantine, she arranged a reasonably formal luncheon for the two of them at home, glad she did.    Finding, while doing all the chores [name] used to do, that he may have been right about how much he was taking care of.  Expressing, aloud sometimes, her love and appreciation for him.    Has heard noises in the house at night, believes [name] is still "earthbound", working out issues from life.  Recalls having had a vision of Suzie about 3 weeks after she passed, of her joyful and playful.  Feels she is still here, too, that she visits from time to time.  [name] even said recently  that Suzie and his own brother had come and visited him.  Comforted with these experiences and ideas.    Therapeutic modalities: Assertiveness/Communication, Humanistic/Existential, Narrative, Family Systems and faith-sensitive  Mental Status/Observations:  Appearance:   Not assessed     Behavior:  Appropriate and Sharing  Motor:  Not assessed  Speech/Language:   Clear and Coherent  Affect:  Not assessed  Mood:  concerned, somewhat anxious; rsponsive to support, reframing, humor, possibilities  Thought process:  normal  Thought content:     WNL  Sensory/Perceptual disturbances:    WNL  Orientation:  grossly intact  Attention:  Good  Concentration:  Fair  Memory:  grossly intact  Insight:    Good  Judgment:   Good  Impulse Control:  Fair   Risk Assessment: Danger to Self:  No Self-injurious Behavior: No Danger to Others: No Duty to Warn:no Physical Aggression / Violence:No  Access to Firearms a concern: No   Diagnosis:   ICD-10-CM   1. Generalized anxiety disorder F41.1   2. Major depressive disorder, recurrent episode, moderate (HCC) F33.1   3. Bereavement Z63.4   4. 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 Self-affirm that ultimately she did treat [name] with love . Maintain amicable posture with stepchildren, refer to legal interpretation wherever estate conflict cannot be prevented.  Consider possibility of framing a peace offering out of the proceeds of the house when such time comes. . Other recommendations/advice as noted above . Continue to utilize previously learned skills ad lib . Maintain medication as prescribed and work faithfully with relevant prescriber(s) if any changes are desired or seem indicated . Call the clinic on-call service, present to ER, or call 911 if any life-threatening psychiatric crisis Return in about 1 month (around 01/31/2019) for will call.   Blanchie Serve, PhD [place] Licensed Psychologist

## 2019-01-02 ENCOUNTER — Ambulatory Visit: Payer: Medicare Other | Admitting: Vascular Surgery

## 2019-01-03 DIAGNOSIS — T8142XD Infection following a procedure, deep incisional surgical site, subsequent encounter: Secondary | ICD-10-CM | POA: Diagnosis not present

## 2019-01-03 DIAGNOSIS — I69328 Other speech and language deficits following cerebral infarction: Secondary | ICD-10-CM | POA: Diagnosis not present

## 2019-01-03 DIAGNOSIS — I10 Essential (primary) hypertension: Secondary | ICD-10-CM | POA: Diagnosis not present

## 2019-01-03 DIAGNOSIS — I2510 Atherosclerotic heart disease of native coronary artery without angina pectoris: Secondary | ICD-10-CM | POA: Diagnosis not present

## 2019-01-03 DIAGNOSIS — I700 Atherosclerosis of aorta: Secondary | ICD-10-CM | POA: Diagnosis not present

## 2019-01-03 DIAGNOSIS — I714 Abdominal aortic aneurysm, without rupture: Secondary | ICD-10-CM | POA: Diagnosis not present

## 2019-01-06 ENCOUNTER — Other Ambulatory Visit: Payer: Self-pay

## 2019-01-06 ENCOUNTER — Other Ambulatory Visit: Payer: Self-pay | Admitting: Psychiatry

## 2019-01-06 DIAGNOSIS — I10 Essential (primary) hypertension: Secondary | ICD-10-CM | POA: Diagnosis not present

## 2019-01-06 DIAGNOSIS — T8142XD Infection following a procedure, deep incisional surgical site, subsequent encounter: Secondary | ICD-10-CM | POA: Diagnosis not present

## 2019-01-06 DIAGNOSIS — I69328 Other speech and language deficits following cerebral infarction: Secondary | ICD-10-CM | POA: Diagnosis not present

## 2019-01-06 DIAGNOSIS — I714 Abdominal aortic aneurysm, without rupture: Secondary | ICD-10-CM | POA: Diagnosis not present

## 2019-01-06 DIAGNOSIS — I700 Atherosclerosis of aorta: Secondary | ICD-10-CM | POA: Diagnosis not present

## 2019-01-06 DIAGNOSIS — I2510 Atherosclerotic heart disease of native coronary artery without angina pectoris: Secondary | ICD-10-CM | POA: Diagnosis not present

## 2019-01-06 MED ORDER — CLONAZEPAM 0.5 MG PO TABS: ORAL_TABLET | ORAL | 2 refills | Status: DC

## 2019-01-06 MED ORDER — CLONAZEPAM 0.5 MG PO TABS: ORAL_TABLET | ORAL | 1 refills | Status: DC

## 2019-01-08 DIAGNOSIS — I714 Abdominal aortic aneurysm, without rupture: Secondary | ICD-10-CM | POA: Diagnosis not present

## 2019-01-08 DIAGNOSIS — I10 Essential (primary) hypertension: Secondary | ICD-10-CM | POA: Diagnosis not present

## 2019-01-08 DIAGNOSIS — I69328 Other speech and language deficits following cerebral infarction: Secondary | ICD-10-CM | POA: Diagnosis not present

## 2019-01-08 DIAGNOSIS — I2510 Atherosclerotic heart disease of native coronary artery without angina pectoris: Secondary | ICD-10-CM | POA: Diagnosis not present

## 2019-01-08 DIAGNOSIS — I700 Atherosclerosis of aorta: Secondary | ICD-10-CM | POA: Diagnosis not present

## 2019-01-08 DIAGNOSIS — T8142XD Infection following a procedure, deep incisional surgical site, subsequent encounter: Secondary | ICD-10-CM | POA: Diagnosis not present

## 2019-01-09 ENCOUNTER — Other Ambulatory Visit: Payer: Self-pay

## 2019-01-09 ENCOUNTER — Ambulatory Visit (INDEPENDENT_AMBULATORY_CARE_PROVIDER_SITE_OTHER): Payer: Medicare Other | Admitting: Psychiatry

## 2019-01-09 ENCOUNTER — Encounter: Payer: Self-pay | Admitting: Psychiatry

## 2019-01-09 DIAGNOSIS — G2581 Restless legs syndrome: Secondary | ICD-10-CM | POA: Diagnosis not present

## 2019-01-09 DIAGNOSIS — Z634 Disappearance and death of family member: Secondary | ICD-10-CM | POA: Diagnosis not present

## 2019-01-09 DIAGNOSIS — G3184 Mild cognitive impairment, so stated: Secondary | ICD-10-CM

## 2019-01-09 DIAGNOSIS — F411 Generalized anxiety disorder: Secondary | ICD-10-CM

## 2019-01-09 DIAGNOSIS — F331 Major depressive disorder, recurrent, moderate: Secondary | ICD-10-CM

## 2019-01-09 DIAGNOSIS — G4721 Circadian rhythm sleep disorder, delayed sleep phase type: Secondary | ICD-10-CM

## 2019-01-09 DIAGNOSIS — I779 Disorder of arteries and arterioles, unspecified: Secondary | ICD-10-CM

## 2019-01-09 NOTE — Progress Notes (Addendum)
[name] [id number] Aug 12, 1946 73 y.o.   Virtual Visit via WebEx  I connected with pt by audio and visual WebEx app and verified that I am speaking with the correct person using two identifiers.   I discussed the limitations, risks, security and privacy concerns of performing an evaluation and management service by t WebEx and the availability of in person appointments. I also discussed with the patient that there may be a patient responsible charge related to this service. The patient expressed understanding and agreed to proceed.  I discussed the assessment and treatment plan with the patient. The patient was provided an opportunity to ask questions and all were answered. The patient agreed with the plan and demonstrated an understanding of the instructions.   The patient was advised to call back or seek an in-person evaluation if the symptoms worsen or if the condition fails to improve as anticipated.  I provided 30 minutes of non-face-to-face time during this encounter. The call started at 1120 and ended at 1150. The patient was located at home and the provider was located office.   Subjective:   Patient ID:  [name] is a 73 y.o. (DOB 1946-04-17) female.  Chief Complaint:  Chief Complaint  Patient presents with  . Follow-up    Medication Management  . Anxiety    Very anxious  . Depression    Husband passed 1 week ago  . Other    Not sleeping    Anxiety  Symptoms include nervous/anxious behavior and shortness of breath. Patient reports no confusion, decreased concentration or suicidal ideas.    Depression         Associated symptoms include fatigue.  Associated symptoms include no decreased concentration and no suicidal ideas.  Past medical history includes anxiety.    [name] presents to the office today for follow-up of depression and anxiety and MCI.    Last seen December.  Had vascular surgery in leg  With complications.  [name] died a week ago.   Kind of a shock with sister gone too.  His D is staying with her a little while.  She lives in [place].  Hard to tell how I'm doing.  Was sleeping fine before [name] died. Sleeping poorly even with meds OTC and Klonopin.  Body tired but legs shake at night. Some initial and terminal insomnia.  Gets mad over the insomnia.   Asks to increase the ropinirole.  Taking at [place].  Still goes to bed late.    Still gets confused at times about things.  Sometimes will realizee she's wrong about things later.  I space out a little.  Afraid of everything right now.  Hasn't decided whether to move to [facility] or stay here.  Will lose income from [place] and that will be hard. He didn't leave a will.  "Lousy"  Grieving sister and [name].  All kins of emotions, including anger.  Hard to suggest.   Poor diet  RLS managed.  Sleep pretty good, except irregular hours.Pt reports that mood is Depressed and describes anxiety as Minimal. Anxiety symptoms include: Excessive Worry, Panic Symptoms,. Pt reports irregular sleep pattern chronically. Pt reports that appetite is decreased. Pt reports that energy is poor and loss of interest or pleasure in usual activities, poor motivation and withdrawn from usual activities. Concentration is scattered. Suicidal thoughts:  denied by patient.  Has forgotten to take the lamotrigine BID and it's only once daily.  Often forgetting meds.  Past Psychiatric Medication  Trials: Lithium 300 insomnia, Depakote, lamotrigine 300, duloxetine 90, ropinirole as high as 2 mg 3 times daily for restless legs, Aricept poorly tolerated, Abilify side effects, fluoxetine, sertraline, paroxetine, Lexapro Long psychiatric history.  Sister bipolar. I have never prescribed lorazepam nor Xanax  Review of Systems:  Review of Systems  Constitutional: Positive for fatigue.  Respiratory: Positive for shortness of breath.   Cardiovascular:       Claudication   Neurological: Negative for tremors and  weakness.  Psychiatric/Behavioral: Positive for depression, dysphoric mood and sleep disturbance. Negative for agitation, behavioral problems, confusion, decreased concentration, hallucinations, self-injury and suicidal ideas. The patient is nervous/anxious. The patient is not hyperactive.    LEG surgery for claudication Jan 3  Medications: I have reviewed the patient's current medications.  Current Outpatient Medications  Medication Sig Dispense Refill  . amLODipine (NORVASC) 10 MG tablet Take 1 tablet (10 mg total) by mouth daily. 30 tablet 4  . aspirin EC 81 MG tablet Take 81 mg by mouth daily.    . Bisacodyl (LAXATIVE PO) Take 1 tablet by mouth daily.    . clonazePAM (KLONOPIN) 0.5 MG tablet Take 1 tab at bedtime and 1 tab PRN for anxiety.  Please do not take the daytime dose unless absolutely necessary 40 tablet 1  . DULoxetine (CYMBALTA) 30 MG capsule Take 1 capsule (30 mg total) by mouth every evening. 30 capsule 3  . DULoxetine (CYMBALTA) 60 MG capsule Take 60 mg by mouth every morning.     . famotidine (PEPCID) 20 MG tablet One at bedtime (Patient taking differently: Take 20 mg by mouth daily as needed for heartburn or indigestion (or nausea). ) 30 tablet 11  . lamoTRIgine (LAMICTAL) 150 MG tablet TAKE 1 TABLET(150 MG) BY MOUTH TWICE DAILY (Patient taking differently: Take 150 mg by mouth daily. ) 180 tablet 0  . linaclotide (LINZESS) 145 MCG CAPS capsule Take 1 capsule (145 mcg total) by mouth daily before breakfast. (Patient taking differently: Take 145 mcg by mouth at bedtime. ) 30 capsule 3  . losartan (COZAAR) 50 MG tablet Take 1 tablet (50 mg total) by mouth daily. 30 tablet 4  . ondansetron (ZOFRAN ODT) 4 MG disintegrating tablet Take 1 tablet (4 mg total) by mouth every 8 (eight) hours as needed for nausea or vomiting. 10 tablet 0  . polyethylene glycol powder (GLYCOLAX/MIRALAX) powder Take 17 g by mouth every other day.    . Probiotic Product (PROBIOTIC-10 ULTIMATE PO) Take by  mouth.    Marland Kitchen rOPINIRole (REQUIP) 2 MG tablet TAKE 1 TABLET BY MOUTH AT BEDTIME (Patient taking differently: Take 2 mg by mouth at bedtime. ) 30 tablet 0   No current facility-administered medications for this visit.     Medication Side Effects: None  Allergies: No Known Allergies  Past Medical History:  Diagnosis Date  . AAA (abdominal aortic aneurysm) (HCC)    3.1 cm 07/08/18, 3 year follow-up recommended; possible 3 cm AAA by aortogram 09/13/18  . Anemia    PMH  . Appendicitis with abscess    07/08/18, s/p perc drain; resolved 07/30/18 by CT  . Arthritis   . Bipolar disorder ([place])   . Cerebrovascular disease    intra and extracranial vascular dx per MRI 4/11, neurology rec strict CVRF control  . Colonic inertia   . Constipation    chronic;severe  . Coronary artery disease   . Depression   . Duodenitis   . EKG abnormalities    changes, stress test neg (  false EKG changes)  . Gastritis   . GERD (gastroesophageal reflux disease)   . Hypertension   . Hypothyroid 01/16/2014  . Meningioma ([place]) 10/21/2013  . PAD (peripheral artery disease) ([place])   . Psoriasis    sees derm  . Stroke ([place])   . Wears dentures   . Wears glasses     Family History  Problem Relation Age of Onset  . Breast cancer Mother        metastisis to bones  . Diabetes Son   . Heart disease Other        grandfather   . Alcohol abuse Brother   . Heart disease Brother   . Heart disease Maternal Aunt   . Lung cancer Brother        smoked  . Colon cancer Neg Hx     Social History   Socioeconomic History  . Marital status: Married    Spouse name: Not on file  . Number of children: 1  . Years of education: Not on file  . Highest education level: Not on file  Occupational History  . Occupation: retired  Scientific laboratory technician  . Financial resource strain: Not hard at all  . Food insecurity:    Worry: Patient refused    Inability: Patient refused  . Transportation needs:    Medical: Patient refused     Non-medical: Patient refused  Tobacco Use  . Smoking status: Current Every Day Smoker    Packs/day: 1.00    Years: 60.00    Pack years: 60.00    Types: Cigarettes  . Smokeless tobacco: Never Used  Substance and Sexual Activity  . Alcohol use: Yes    Alcohol/week: 0.0 standard drinks    Comment: yes on occassion  . Drug use: No  . Sexual activity: Yes  Lifestyle  . Physical activity:    Days per week: Patient refused    Minutes per session: Patient refused  . Stress: Not on file  Relationships  . Social connections:    Talks on phone: Patient refused    Gets together: Patient refused    Attends religious service: Patient refused    Active member of club or organization: Patient refused    Attends meetings of clubs or organizations: Patient refused    Relationship status: Patient refused  . Intimate partner violence:    Fear of current or ex partner: Patient refused    Emotionally abused: Patient refused    Physically abused: Patient refused    Forced sexual activity: Patient refused  Other Topics Concern  . Not on file  Social History Narrative   Brother in law Mr Rexford Maus (one of my patients)   Lives w/ husband        Past Medical History, Surgical history, Social history, and Family history were reviewed and updated as appropriate.   Please see review of systems for further details on the patient's review from today.   Objective:   Physical Exam:  There were no vitals taken for this visit.  Physical Exam Neurological:     Mental Status: She is alert and oriented to person, place, and time.     Cranial Nerves: No dysarthria.  Psychiatric:        Attention and Perception: She is inattentive. She does not perceive auditory hallucinations.        Mood and Affect: Mood is anxious and depressed.        Speech: Speech normal. Speech is not rapid and pressured or  slurred.        Behavior: Behavior is not slowed or aggressive. Behavior is cooperative.         Thought Content: Thought content normal. Thought content is not paranoid or delusional. Thought content does not include homicidal or suicidal ideation. Thought content does not include homicidal or suicidal plan.        Cognition and Memory: Memory is impaired.     Comments: She is somewhat chronically scattered and easily confused and that is continuing but partly related to grief and not sleeping as well lately since McCune died.  Insight and judgment are fair     Lab Review:     Component Value Date/Time   NA 133 (L) 11/20/2018 1842   NA 134 11/13/2018 1659   K 4.3 11/20/2018 1842   CL 100 11/20/2018 1842   CO2 23 11/20/2018 1842   GLUCOSE 100 (H) 11/20/2018 1842   BUN 12 11/20/2018 1842   BUN 17 11/13/2018 1659   CREATININE 0.91 11/20/2018 1842   CREATININE 0.77 07/28/2015 1805   CALCIUM 9.9 11/20/2018 1842   PROT 7.8 11/20/2018 1842   PROT 7.4 03/05/2018 1758   ALBUMIN 3.8 11/20/2018 1842   ALBUMIN 4.4 03/05/2018 1758   AST 24 11/20/2018 1842   ALT 21 11/20/2018 1842   ALKPHOS 100 11/20/2018 1842   BILITOT 0.6 11/20/2018 1842   BILITOT 0.3 03/05/2018 1758   GFRNONAA >60 11/20/2018 1842   GFRAA >60 11/20/2018 1842       Component Value Date/Time   WBC 9.5 11/20/2018 1842   RBC 4.31 11/20/2018 1842   HGB 12.6 11/20/2018 1842   HGB 13.6 03/05/2018 1758   HCT 40.0 11/20/2018 1842   HCT 41.9 03/05/2018 1758   PLT 290 11/20/2018 1842   PLT 241 03/05/2018 1758   MCV 92.8 11/20/2018 1842   MCV 87.1 11/13/2018 1227   MCV 93 03/05/2018 1758   MCH 29.2 11/20/2018 1842   MCHC 31.5 11/20/2018 1842   RDW 13.9 11/20/2018 1842   RDW 14.5 03/05/2018 1758   LYMPHSABS 2.3 11/20/2018 1842   MONOABS 1.0 11/20/2018 1842   EOSABS 0.4 11/20/2018 1842   BASOSABS 0.1 11/20/2018 1842    No results found for: POCLITH, LITHIUM   No results found for: PHENYTOIN, PHENOBARB, VALPROATE, CBMZ   .res Assessment: Plan:    Major depressive disorder, recurrent episode, moderate  (HCC)  Generalized anxiety disorder  Bereavement  Restless legs syndrome  Delayed sleep phase syndrome  Mild cognitive impairment  [name] has some chronic depression and chronic irregular sleep patterns which have been resistant to treatment.  Her chronic depression and anxiety are complicated now by double grief of loss of her sister and now her husband.  She is a little more scattered as a result as well and is not sleeping as well.  I hesitate to increase the clonazepam because of the mild cognitive impairment but it sounds like she is having increased restless legs so we will address that.  Hesitate to increase lamotrigine DT her age and risk of SE including balance and cognitive issues.  Cont counseling for grief, depression, anxiety.  Disc stages of grief with [name] and sister.  She has a lot of decisions to make and I encouraged her to lean on her family and friends to help her think through these decisions given her mild cognitive impairment status.  And of course the grief affecting her thinking and judgment.  Use pill box to improve compliance.  Discussed  serotonin withdrawal symptoms.  Also the risk of greater depression because of missing the meds. Still taking the duloxetine 60+30 mg.  Occ forgets and feels bad.  Disc SSRI withdrawal which she gets when she forgets.  She stopped the donepezil bc GI upset.  Consider Exelon patch for cognition.  But defer this because she is not in a condition to start a new medication at this time.  Sleep hygiene issues discussed although this is been very resistant to change.  We need to make sure she is not drinking too much caffeine especially late in the day.  Restless legs is not controlled with ropinirole.  Increase to 3 mg for RLS.  Disc SE incl hallucinations and dizziness.    Call if this fails and will consider doing something with the clonazepam either increasing it or perhaps switching to another benzodiazepine which might have less  cognitive problems.  Such as lorazepam  Emphasized importance and proper nutrition and sleep and protein.  Doesn't eat much meat bc constipation but will eat fish.  Discussed how protein is necessary for the proper response to depression medicines  In the context of acute grief it would not be appropriate to change her mood medications  FU 2 mos  Lynder Parents, MD, DFAPA  Please see After Visit Summary for patient specific instructions.  Future Appointments  Date Time Provider [place]  05/01/2019  1:00 PM [facility] - [facility]  05/01/2019  2:00 PM [facility] - [facility]  05/01/2019  2:15 PM Nickel, Sharmon Leyden, NP [facility]    No orders of the defined types were placed in this encounter.     -------------------------------

## 2019-01-10 DIAGNOSIS — I714 Abdominal aortic aneurysm, without rupture: Secondary | ICD-10-CM | POA: Diagnosis not present

## 2019-01-10 DIAGNOSIS — I69328 Other speech and language deficits following cerebral infarction: Secondary | ICD-10-CM | POA: Diagnosis not present

## 2019-01-10 DIAGNOSIS — I700 Atherosclerosis of aorta: Secondary | ICD-10-CM | POA: Diagnosis not present

## 2019-01-10 DIAGNOSIS — I10 Essential (primary) hypertension: Secondary | ICD-10-CM | POA: Diagnosis not present

## 2019-01-10 DIAGNOSIS — T8142XD Infection following a procedure, deep incisional surgical site, subsequent encounter: Secondary | ICD-10-CM | POA: Diagnosis not present

## 2019-01-10 DIAGNOSIS — I2510 Atherosclerotic heart disease of native coronary artery without angina pectoris: Secondary | ICD-10-CM | POA: Diagnosis not present

## 2019-01-13 ENCOUNTER — Telehealth: Payer: Self-pay | Admitting: Family Medicine

## 2019-01-13 DIAGNOSIS — I10 Essential (primary) hypertension: Secondary | ICD-10-CM | POA: Diagnosis not present

## 2019-01-13 DIAGNOSIS — I2510 Atherosclerotic heart disease of native coronary artery without angina pectoris: Secondary | ICD-10-CM | POA: Diagnosis not present

## 2019-01-13 DIAGNOSIS — I69328 Other speech and language deficits following cerebral infarction: Secondary | ICD-10-CM | POA: Diagnosis not present

## 2019-01-13 DIAGNOSIS — I700 Atherosclerosis of aorta: Secondary | ICD-10-CM | POA: Diagnosis not present

## 2019-01-13 DIAGNOSIS — T8142XD Infection following a procedure, deep incisional surgical site, subsequent encounter: Secondary | ICD-10-CM | POA: Diagnosis not present

## 2019-01-13 DIAGNOSIS — I714 Abdominal aortic aneurysm, without rupture: Secondary | ICD-10-CM | POA: Diagnosis not present

## 2019-01-13 NOTE — Telephone Encounter (Signed)
Copied from [place] [id number]. Topic: Quick Communication - Home Health Verbal Orders >> Jan 13, 2019  1:35 PM Scherrie Gerlach wrote: Caller/Agency: Langley Gauss / [facility] Callback Number: [phone] Requesting additional visits to extend visits a couple of weeks, for bp monitoring, (been a little high) pt has been a little depressed, diet instruction

## 2019-01-13 NOTE — Telephone Encounter (Signed)
Called and gave verbal orders for extend home visits for B/P Monitoring

## 2019-01-16 DIAGNOSIS — I700 Atherosclerosis of aorta: Secondary | ICD-10-CM | POA: Diagnosis not present

## 2019-01-16 DIAGNOSIS — I714 Abdominal aortic aneurysm, without rupture: Secondary | ICD-10-CM | POA: Diagnosis not present

## 2019-01-16 DIAGNOSIS — I2510 Atherosclerotic heart disease of native coronary artery without angina pectoris: Secondary | ICD-10-CM | POA: Diagnosis not present

## 2019-01-16 DIAGNOSIS — I69328 Other speech and language deficits following cerebral infarction: Secondary | ICD-10-CM | POA: Diagnosis not present

## 2019-01-16 DIAGNOSIS — T8142XD Infection following a procedure, deep incisional surgical site, subsequent encounter: Secondary | ICD-10-CM | POA: Diagnosis not present

## 2019-01-16 DIAGNOSIS — I10 Essential (primary) hypertension: Secondary | ICD-10-CM | POA: Diagnosis not present

## 2019-01-23 DIAGNOSIS — I69328 Other speech and language deficits following cerebral infarction: Secondary | ICD-10-CM | POA: Diagnosis not present

## 2019-01-23 DIAGNOSIS — I700 Atherosclerosis of aorta: Secondary | ICD-10-CM | POA: Diagnosis not present

## 2019-01-23 DIAGNOSIS — T8142XD Infection following a procedure, deep incisional surgical site, subsequent encounter: Secondary | ICD-10-CM | POA: Diagnosis not present

## 2019-01-23 DIAGNOSIS — I10 Essential (primary) hypertension: Secondary | ICD-10-CM | POA: Diagnosis not present

## 2019-01-23 DIAGNOSIS — I714 Abdominal aortic aneurysm, without rupture: Secondary | ICD-10-CM | POA: Diagnosis not present

## 2019-01-23 DIAGNOSIS — I2510 Atherosclerotic heart disease of native coronary artery without angina pectoris: Secondary | ICD-10-CM | POA: Diagnosis not present

## 2019-01-27 ENCOUNTER — Telehealth: Payer: Self-pay | Admitting: Psychiatry

## 2019-01-27 NOTE — Telephone Encounter (Signed)
[name] called needed an appt. Soon.She is slipping down and needs some help.  Please call to discuss and advice what to do.  Made appt with CC on 02/27/19 and on wait list.  Made appt with AM 02/06/19

## 2019-01-29 DIAGNOSIS — M5186 Other intervertebral disc disorders, lumbar region: Secondary | ICD-10-CM | POA: Diagnosis not present

## 2019-01-29 DIAGNOSIS — F319 Bipolar disorder, unspecified: Secondary | ICD-10-CM | POA: Diagnosis not present

## 2019-01-29 DIAGNOSIS — I10 Essential (primary) hypertension: Secondary | ICD-10-CM | POA: Diagnosis not present

## 2019-01-29 DIAGNOSIS — I2510 Atherosclerotic heart disease of native coronary artery without angina pectoris: Secondary | ICD-10-CM | POA: Diagnosis not present

## 2019-01-29 DIAGNOSIS — R531 Weakness: Secondary | ICD-10-CM | POA: Diagnosis not present

## 2019-01-29 DIAGNOSIS — G2581 Restless legs syndrome: Secondary | ICD-10-CM | POA: Diagnosis not present

## 2019-01-29 DIAGNOSIS — E46 Unspecified protein-calorie malnutrition: Secondary | ICD-10-CM | POA: Diagnosis not present

## 2019-01-29 DIAGNOSIS — I714 Abdominal aortic aneurysm, without rupture: Secondary | ICD-10-CM | POA: Diagnosis not present

## 2019-01-29 DIAGNOSIS — I69398 Other sequelae of cerebral infarction: Secondary | ICD-10-CM | POA: Diagnosis not present

## 2019-01-29 DIAGNOSIS — I700 Atherosclerosis of aorta: Secondary | ICD-10-CM | POA: Diagnosis not present

## 2019-01-29 DIAGNOSIS — J8410 Pulmonary fibrosis, unspecified: Secondary | ICD-10-CM | POA: Diagnosis not present

## 2019-01-29 DIAGNOSIS — M4186 Other forms of scoliosis, lumbar region: Secondary | ICD-10-CM | POA: Diagnosis not present

## 2019-01-29 DIAGNOSIS — F411 Generalized anxiety disorder: Secondary | ICD-10-CM | POA: Diagnosis not present

## 2019-01-29 DIAGNOSIS — G3184 Mild cognitive impairment, so stated: Secondary | ICD-10-CM | POA: Diagnosis not present

## 2019-01-29 DIAGNOSIS — M47815 Spondylosis without myelopathy or radiculopathy, thoracolumbar region: Secondary | ICD-10-CM | POA: Diagnosis not present

## 2019-01-29 DIAGNOSIS — G8929 Other chronic pain: Secondary | ICD-10-CM | POA: Diagnosis not present

## 2019-01-29 DIAGNOSIS — M5442 Lumbago with sciatica, left side: Secondary | ICD-10-CM | POA: Diagnosis not present

## 2019-01-29 DIAGNOSIS — F17210 Nicotine dependence, cigarettes, uncomplicated: Secondary | ICD-10-CM | POA: Diagnosis not present

## 2019-01-29 DIAGNOSIS — G479 Sleep disorder, unspecified: Secondary | ICD-10-CM | POA: Diagnosis not present

## 2019-01-29 DIAGNOSIS — I69328 Other speech and language deficits following cerebral infarction: Secondary | ICD-10-CM | POA: Diagnosis not present

## 2019-01-29 DIAGNOSIS — J449 Chronic obstructive pulmonary disease, unspecified: Secondary | ICD-10-CM | POA: Diagnosis not present

## 2019-01-29 DIAGNOSIS — E039 Hypothyroidism, unspecified: Secondary | ICD-10-CM | POA: Diagnosis not present

## 2019-01-29 DIAGNOSIS — K5909 Other constipation: Secondary | ICD-10-CM | POA: Diagnosis not present

## 2019-01-29 DIAGNOSIS — T8142XD Infection following a procedure, deep incisional surgical site, subsequent encounter: Secondary | ICD-10-CM | POA: Diagnosis not present

## 2019-01-29 DIAGNOSIS — E785 Hyperlipidemia, unspecified: Secondary | ICD-10-CM | POA: Diagnosis not present

## 2019-01-30 DIAGNOSIS — I714 Abdominal aortic aneurysm, without rupture: Secondary | ICD-10-CM | POA: Diagnosis not present

## 2019-01-30 DIAGNOSIS — I700 Atherosclerosis of aorta: Secondary | ICD-10-CM | POA: Diagnosis not present

## 2019-01-30 DIAGNOSIS — I69328 Other speech and language deficits following cerebral infarction: Secondary | ICD-10-CM | POA: Diagnosis not present

## 2019-01-30 DIAGNOSIS — T8142XD Infection following a procedure, deep incisional surgical site, subsequent encounter: Secondary | ICD-10-CM | POA: Diagnosis not present

## 2019-01-30 DIAGNOSIS — I2510 Atherosclerotic heart disease of native coronary artery without angina pectoris: Secondary | ICD-10-CM | POA: Diagnosis not present

## 2019-01-30 DIAGNOSIS — I10 Essential (primary) hypertension: Secondary | ICD-10-CM | POA: Diagnosis not present

## 2019-01-30 NOTE — Telephone Encounter (Signed)
Please put her on 30-minute cancellation list with me

## 2019-01-31 ENCOUNTER — Ambulatory Visit (INDEPENDENT_AMBULATORY_CARE_PROVIDER_SITE_OTHER): Payer: Medicare Other | Admitting: Family Medicine

## 2019-01-31 ENCOUNTER — Other Ambulatory Visit: Payer: Self-pay

## 2019-01-31 ENCOUNTER — Encounter: Payer: Self-pay | Admitting: Family Medicine

## 2019-01-31 VITALS — BP 151/69 | HR 70 | Temp 97.6°F | Ht 68.0 in

## 2019-01-31 DIAGNOSIS — I779 Disorder of arteries and arterioles, unspecified: Secondary | ICD-10-CM

## 2019-01-31 DIAGNOSIS — F339 Major depressive disorder, recurrent, unspecified: Secondary | ICD-10-CM | POA: Diagnosis not present

## 2019-01-31 DIAGNOSIS — M25562 Pain in left knee: Secondary | ICD-10-CM

## 2019-01-31 NOTE — Telephone Encounter (Signed)
Please put her on my schedule for work in this afternoon

## 2019-01-31 NOTE — Progress Notes (Signed)
Subjective:    Patient ID: [name], female    DOB: [birth year], 73 y.o.   MRN: [id number]  HPI [name] is a 73 y.o. female Presents today for: Chief Complaint  Patient presents with  . left knee pain    twisted the knee 8-9 days ago. wears a knee brace and it wakes her up at night and feeling better today  . Depression    PHQ9=23 and loved ones just past away    L knee pain: Gardening 8-9 days ago - misstep and twisted L knee. Had some swelling in knee.no known pop.  No prior L knee surgery.   Able to bear weight, hurts to put full weight. sensation of giving way occasionally, no repeat fall. Wearing brace past few days - less sore, seems to be improving. alleve - once per day. No giving way past 3 days.  Stepdaughter at home - helping with cooking and walking the dog, meals.  Pt independent in bathing/clothing.  No prior left knee surgery, did have cartilage removed in R knee years ago.   PAD with fempop bypass in February, subsequent infection. Has been doing well recently and wounds have healed.   Depression:  On cymbalta, lamictal, klonopin. Followed by Dr. Clovis Pu.  Therapist Barron Schmid - appt 3 weeks ago and 28th, will see Dr. Clovis Pu in next month.  Husband passed away recently.   Does not feel like drugs are working well. Feeling confused about things - stressed. Grieving loss of [name].  Suicidal thoughts: not thinking about suicide, but thoughts of would I be better off not here.  Has not talked to [place] about these thoughts.  No intent or plan to commit suicide. Fleeting thoughts of if she were not here.  She does have hope and future plans.  Agrees to call therapist and discuss symptoms with therapist.      Depression screen [facility] 2/9 01/31/2019 11/13/2018 11/06/2018 05/30/2018 04/30/2018  Decreased Interest 3 0 0 0 0  Down, Depressed, Hopeless 3 1 0 3 0  PHQ - 2 Score 6 1 0 3 0  Altered sleeping 2 - 0 2 -  Tired, decreased energy 3 - 0 3 -  Change in  appetite 3 - 0 3 -  Feeling bad or failure about yourself  0 - 0 2 -  Trouble concentrating 3 - 0 0 -  Moving slowly or fidgety/restless 3 - 0 0 -  Suicidal thoughts 3 - 0 1 -  PHQ-9 Score 23 - 0 14 -  Difficult doing work/chores Not difficult at all - Not difficult at all Somewhat difficult -  Some recent data might be hidden     Review of Systems Per HPI.     Objective:   Physical Exam Constitutional:      General: She is not in acute distress.    Appearance: She is well-developed.  HENT:     Head: Normocephalic and atraumatic.  Cardiovascular:     Rate and Rhythm: Normal rate.  Pulmonary:     Effort: Pulmonary effort is normal.  Musculoskeletal:       Legs:  Neurological:     Mental Status: She is alert and oriented to person, place, and time.  Psychiatric:        Attention and Perception: Attention normal.        Mood and Affect: Mood is depressed.        Speech: Speech normal.  Behavior: Behavior normal. Behavior is cooperative.        Thought Content: Thought content does not include suicidal ideation. Thought content does not include suicidal plan.    Vitals:   01/31/19 1624  BP: (!) 151/69  Pulse: 70  Temp: 97.6 F (36.4 C)  TempSrc: Oral  SpO2: 96%  Height: 5\' 8"  (1.727 m)    No results found.     Assessment & Plan:   [name] is a 73 y.o. female Left medial knee pain - Plan: DG Knee Complete 4 Views Left  -Possible degenerative meniscal issue versus arthritis.  Will order x-ray.  Has had some improvement with brace, okay to continue over-the-counter bracing, range of motion, Tylenol if needed for pain, ice if needed for swelling.  Depending on x-ray results and symptoms over the next week, consider orthopedic eval.  RTC precautions if worse.  Depression with question of possible suicidal ideation discussed.  No true suicidal ideation/intent or plan but fleeting thoughts as above.  Denies active suicidal ideation.  Has close follow-up  with therapist as well as psychiatrist, but plans for her to call her therapist first thing Monday morning, ER/911 precautions if any true suicidal ideation.  Understanding expressed and agreed with plan.   No orders of the defined types were placed in this encounter.  Patient Instructions   Please call Barron Schmid to discuss your depression symptoms, and Dr. Clovis Pu may also need to evaluate for possible medication changes for your depression. but if you are having thoughts of suicide - call 911 or be seen at [facility] or emergency room.   For knee pain - I will refer you for an xray. Ok to continue the brace for now, but range of motion few times per day.  Ice if needed for swelling up to 10 mins. Tylenol if needed for pain. Depending on xray and symptoms in next week, we may need to have you evaluated by orthopedics.   Acute Knee Pain, Adult Acute knee pain is sudden and may be caused by damage, swelling, or irritation of the muscles and tissues that support your knee. The injury may result from:  A fall.  An injury to your knee from twisting motions.  A hit to the knee.  Infection. Acute knee pain may go away on its own with time and rest. If it does not, your health care provider may order tests to find the cause of the pain. These may include:  Imaging tests, such as an X-ray, MRI, or ultrasound.  Joint aspiration. In this test, fluid is removed from the knee.  Arthroscopy. In this test, a lighted tube is inserted into the knee and an image is projected onto a TV screen.  Biopsy. In this test, a sample of tissue is removed from the body and studied under a microscope. Follow these instructions at home: Pay attention to any changes in your symptoms. Take these actions to relieve your pain. If you have a knee sleeve or brace:   Wear the sleeve or brace as told by your health care provider. Remove it only as told by your health care provider.  Loosen the sleeve or  brace if your toes tingle, become numb, or turn cold and blue.  Keep the sleeve or brace clean.  If the sleeve or brace is not waterproof: ? Do not let it get wet. ? Cover it with a watertight covering when you take a bath or shower. Activity  Rest your knee.  Do not do things that cause pain or make pain worse.  Avoid high-impact activities or exercises, such as running, jumping rope, or doing jumping jacks.  Work with a physical therapist to make a safe exercise program, as recommended by your health care provider. Do exercises as told by your physical therapist. Managing pain, stiffness, and swelling   If directed, put ice on the knee: ? Put ice in a plastic bag. ? Place a towel between your skin and the bag. ? Leave the ice on for 20 minutes, 2-3 times a day.  If directed, use an elastic bandage to put pressure (compression) on your injured knee. This may control swelling, give support, and help with discomfort. General instructions  Take over-the-counter and prescription medicines only as told by your health care provider.  Raise (elevate) your knee above the level of your heart when you are sitting or lying down.  Sleep with a pillow under your knee.  Do not use any products that contain nicotine or tobacco, such as cigarettes, e-cigarettes, and chewing tobacco. These can delay healing. If you need help quitting, ask your health care provider.  If you are overweight, work with your health care provider and a dietitian to set a weight-loss goal that is healthy and reasonable for you. Extra weight can put pressure on your knee.  Keep all follow-up visits as told by your health care provider. This is important. Contact a health care provider if:  Your knee pain continues, changes, or gets worse.  You have a fever along with knee pain.  Your knee feels warm to the touch.  Your knee buckles or locks up. Get help right away if:  Your knee swells, and the swelling  becomes worse.  You cannot move your knee.  You have severe pain in your knee. Summary  Acute knee pain can be caused by a fall, an injury, an infection, or damage, swelling, or irritation of the tissues that support your knee.  Your health care provider may perform tests to find out the cause of the pain.  Pay attention to any changes in your symptoms. Relieve your pain with rest, medicines, light activity, and use of ice.  Get help if your pain continues or becomes worse, your knee swells, or you cannot move your knee. This information is not intended to replace advice given to you by your health care provider. Make sure you discuss any questions you have with your health care provider. Document Released: 06/25/2007 Document Revised: 02/07/2018 Document Reviewed: 02/07/2018 Elsevier Interactive Patient Education  [employer].    If you have lab work done today you will be contacted with your lab results within the next 2 weeks.  If you have not heard from [place] then please contact us. The fastest way to get your results is to register for My Chart.   IF you received an x-ray today, you will receive an invoice from [facility]. Please contact [facility] at [phone] with questions or concerns regarding your invoice.   IF you received labwork today, you will receive an invoice from [facility]. Please contact [facility] at [phone] with questions or concerns regarding your invoice.   Our billing staff will not be able to assist you with questions regarding bills from these companies.  You will be contacted with the lab results as soon as they are available. The fastest way to get your results is to activate your My Chart account. Instructions are located on the last page of  this paperwork. If you have not heard from [place] regarding the results in 2 weeks, please contact this office.      Signed,   Merri Ray, MD [facility].  02/06/19 1:24 PM

## 2019-01-31 NOTE — Patient Instructions (Addendum)
Please call Barron Schmid to discuss your depression symptoms, and Dr. Clovis Pu may also need to evaluate for possible medication changes for your depression. but if you are having thoughts of suicide - call 911 or be seen at [facility] or emergency room.   For knee pain - I will refer you for an xray. Ok to continue the brace for now, but range of motion few times per day.  Ice if needed for swelling up to 10 mins. Tylenol if needed for pain. Depending on xray and symptoms in next week, we may need to have you evaluated by orthopedics.   Acute Knee Pain, Adult Acute knee pain is sudden and may be caused by damage, swelling, or irritation of the muscles and tissues that support your knee. The injury may result from:  A fall.  An injury to your knee from twisting motions.  A hit to the knee.  Infection. Acute knee pain may go away on its own with time and rest. If it does not, your health care provider may order tests to find the cause of the pain. These may include:  Imaging tests, such as an X-ray, MRI, or ultrasound.  Joint aspiration. In this test, fluid is removed from the knee.  Arthroscopy. In this test, a lighted tube is inserted into the knee and an image is projected onto a TV screen.  Biopsy. In this test, a sample of tissue is removed from the body and studied under a microscope. Follow these instructions at home: Pay attention to any changes in your symptoms. Take these actions to relieve your pain. If you have a knee sleeve or brace:   Wear the sleeve or brace as told by your health care provider. Remove it only as told by your health care provider.  Loosen the sleeve or brace if your toes tingle, become numb, or turn cold and blue.  Keep the sleeve or brace clean.  If the sleeve or brace is not waterproof: ? Do not let it get wet. ? Cover it with a watertight covering when you take a bath or shower. Activity  Rest your knee.  Do not do things that cause  pain or make pain worse.  Avoid high-impact activities or exercises, such as running, jumping rope, or doing jumping jacks.  Work with a physical therapist to make a safe exercise program, as recommended by your health care provider. Do exercises as told by your physical therapist. Managing pain, stiffness, and swelling   If directed, put ice on the knee: ? Put ice in a plastic bag. ? Place a towel between your skin and the bag. ? Leave the ice on for 20 minutes, 2-3 times a day.  If directed, use an elastic bandage to put pressure (compression) on your injured knee. This may control swelling, give support, and help with discomfort. General instructions  Take over-the-counter and prescription medicines only as told by your health care provider.  Raise (elevate) your knee above the level of your heart when you are sitting or lying down.  Sleep with a pillow under your knee.  Do not use any products that contain nicotine or tobacco, such as cigarettes, e-cigarettes, and chewing tobacco. These can delay healing. If you need help quitting, ask your health care provider.  If you are overweight, work with your health care provider and a dietitian to set a weight-loss goal that is healthy and reasonable for you. Extra weight can put pressure on your knee.  Keep all  follow-up visits as told by your health care provider. This is important. Contact a health care provider if:  Your knee pain continues, changes, or gets worse.  You have a fever along with knee pain.  Your knee feels warm to the touch.  Your knee buckles or locks up. Get help right away if:  Your knee swells, and the swelling becomes worse.  You cannot move your knee.  You have severe pain in your knee. Summary  Acute knee pain can be caused by a fall, an injury, an infection, or damage, swelling, or irritation of the tissues that support your knee.  Your health care provider may perform tests to find out the cause  of the pain.  Pay attention to any changes in your symptoms. Relieve your pain with rest, medicines, light activity, and use of ice.  Get help if your pain continues or becomes worse, your knee swells, or you cannot move your knee. This information is not intended to replace advice given to you by your health care provider. Make sure you discuss any questions you have with your health care provider. Document Released: 06/25/2007 Document Revised: 02/07/2018 Document Reviewed: 02/07/2018 Elsevier Interactive Patient Education  [employer].    If you have lab work done today you will be contacted with your lab results within the next 2 weeks.  If you have not heard from [place] then please contact us. The fastest way to get your results is to register for My Chart.   IF you received an x-ray today, you will receive an invoice from [facility]. Please contact [facility] at [phone] with questions or concerns regarding your invoice.   IF you received labwork today, you will receive an invoice from [facility]. Please contact [facility] at [phone] with questions or concerns regarding your invoice.   Our billing staff will not be able to assist you with questions regarding bills from these companies.  You will be contacted with the lab results as soon as they are available. The fastest way to get your results is to activate your My Chart account. Instructions are located on the last page of this paperwork. If you have not heard from [place] regarding the results in 2 weeks, please contact this office.

## 2019-02-04 ENCOUNTER — Other Ambulatory Visit: Payer: Self-pay

## 2019-02-04 ENCOUNTER — Encounter: Payer: Medicare Other | Admitting: Psychiatry

## 2019-02-04 NOTE — Progress Notes (Signed)
This encounter was created in error - please disregard.

## 2019-02-05 ENCOUNTER — Encounter: Payer: Self-pay | Admitting: Psychiatry

## 2019-02-05 ENCOUNTER — Other Ambulatory Visit: Payer: Self-pay

## 2019-02-05 ENCOUNTER — Ambulatory Visit (INDEPENDENT_AMBULATORY_CARE_PROVIDER_SITE_OTHER): Payer: Medicare Other | Admitting: Psychiatry

## 2019-02-05 DIAGNOSIS — G4721 Circadian rhythm sleep disorder, delayed sleep phase type: Secondary | ICD-10-CM

## 2019-02-05 DIAGNOSIS — G3184 Mild cognitive impairment, so stated: Secondary | ICD-10-CM

## 2019-02-05 DIAGNOSIS — I779 Disorder of arteries and arterioles, unspecified: Secondary | ICD-10-CM

## 2019-02-05 DIAGNOSIS — G2581 Restless legs syndrome: Secondary | ICD-10-CM

## 2019-02-05 DIAGNOSIS — F411 Generalized anxiety disorder: Secondary | ICD-10-CM | POA: Diagnosis not present

## 2019-02-05 DIAGNOSIS — F331 Major depressive disorder, recurrent, moderate: Secondary | ICD-10-CM | POA: Diagnosis not present

## 2019-02-05 DIAGNOSIS — Z634 Disappearance and death of family member: Secondary | ICD-10-CM | POA: Diagnosis not present

## 2019-02-05 MED ORDER — RISPERIDONE 0.25 MG PO TABS: 0.2500 mg | ORAL_TABLET | Freq: Every day | ORAL | 1 refills | Status: DC

## 2019-02-05 NOTE — Progress Notes (Signed)
[name] [id number] Feb 15, 1946 73 y.o.   Virtual Visit via WebEx  I connected with pt by audio and visual WebEx app and verified that I am speaking with the correct person using two identifiers.   I discussed the limitations, risks, security and privacy concerns of performing an evaluation and management service by t WebEx and the availability of in person appointments. I also discussed with the patient that there may be a patient responsible charge related to this service. The patient expressed understanding and agreed to proceed.  I discussed the assessment and treatment plan with the patient. The patient was provided an opportunity to ask questions and all were answered. The patient agreed with the plan and demonstrated an understanding of the instructions.   The patient was advised to call back or seek an in-person evaluation if the symptoms worsen or if the condition fails to improve as anticipated.  I provided 30 minutes of non-face-to-face time during this encounter. The call started at 435 and ended at 5:05 o'clock. The patient was located at home and the provider was located office.   Subjective:   Patient ID:  [name] is a 73 y.o. (DOB 1946-05-06) female.  Chief Complaint:  Chief Complaint  Patient presents with  . Follow-up    Medication management  . Depression    Medication management  . Anxiety    Medication management    Anxiety  Symptoms include decreased concentration, nervous/anxious behavior and shortness of breath. Patient reports no confusion or suicidal ideas.    Depression         Associated symptoms include decreased concentration and fatigue.  Associated symptoms include no suicidal ideas.  Past medical history includes anxiety.    [name] presents to the office today for follow-up of depression and anxiety and MCI.    Last visit was January 09, 2019.  She was having problems with restless legs and ropinirole was increased to 3 mg every  afternoon.  There were no other med changes.  She called on May 18 asking for help for worsening depression.  So an earlier appointment was scheduled.  However she missed that appointment and has been rescheduled for today.  Feels stuck and reduced concentration and productivity.  Frozen in space.  A lot is grief over [name]'s death and trying to get things done feeling under pressure.  Weird anger outbursts yelling at people in public and then in 10 minutes it's gone.  Anger on and off for a month.  A little paranoid about brother in law and thinks negative things about family without a reason.  Anxiety and depression are similar.  Terrible energy.  Poor diet.  Sweets make her hyper.  Going to bed 5am the last week and up at noon.  Just sits in front of TV procrastinating going to bed.  Allergies she is having some rapid mood swings.  RLS managed.  Sleep pretty good, except irregular hours.Pt reports that mood is Depressed and describes anxiety as worse and moderate in the same dose for depression.  She is also having problems with irritability. Anxiety symptoms include: Excessive Worry, Panic Symptoms,. Pt reports irregular sleep pattern chronically. Pt reports that appetite is decreased. Pt reports that energy is poor and loss of interest or pleasure in usual activities, poor motivation and withdrawn from usual activities. Concentration is scattered. Crying spells.  Guilt feelings over relations with [name].  Suicidal thoughts:  denied by patient.  Increased ropinirole helped RLS.  Her  sister had bipolar disorder.  Past Psychiatric Medication Trials: Lithium 300 insomnia, Depakote, olanzapine 2.5,  lamotrigine 300, duloxetine 90, ropinirole as high as 2 mg 3 times daily for restless legs, Aricept poorly tolerated, Abilify side effects, fluoxetine, sertraline, paroxetine, Lexapro Long psychiatric history.  Sister bipolar. I have never prescribed lorazepam nor Xanax  Review of Systems:  Review  of Systems  Constitutional: Positive for fatigue.  Respiratory: Positive for shortness of breath.   Cardiovascular:       Claudication   Neurological: Negative for tremors and weakness.  Psychiatric/Behavioral: Positive for agitation, decreased concentration, depression, dysphoric mood and sleep disturbance. Negative for behavioral problems, confusion, hallucinations, self-injury and suicidal ideas. The patient is nervous/anxious. The patient is not hyperactive.    LEG surgery for claudication Jan 3  Medications: I have reviewed the patient's current medications.  Current Outpatient Medications  Medication Sig Dispense Refill  . amLODipine (NORVASC) 10 MG tablet Take 1 tablet (10 mg total) by mouth daily. 30 tablet 4  . aspirin EC 81 MG tablet Take 81 mg by mouth daily.    . Bisacodyl (LAXATIVE PO) Take 1 tablet by mouth daily.    . clonazePAM (KLONOPIN) 0.5 MG tablet Take 1 tab at bedtime and 1 tab PRN for anxiety.  Please do not take the daytime dose unless absolutely necessary 40 tablet 1  . DULoxetine (CYMBALTA) 30 MG capsule Take 1 capsule (30 mg total) by mouth every evening. 30 capsule 3  . DULoxetine (CYMBALTA) 60 MG capsule Take 60 mg by mouth every morning.     . famotidine (PEPCID) 20 MG tablet One at bedtime (Patient taking differently: Take 20 mg by mouth daily as needed for heartburn or indigestion (or nausea). ) 30 tablet 11  . lamoTRIgine (LAMICTAL) 150 MG tablet TAKE 1 TABLET(150 MG) BY MOUTH TWICE DAILY (Patient taking differently: Take 150 mg by mouth daily. ) 180 tablet 0  . linaclotide (LINZESS) 145 MCG CAPS capsule Take 1 capsule (145 mcg total) by mouth daily before breakfast. (Patient taking differently: Take 145 mcg by mouth at bedtime. ) 30 capsule 3  . losartan (COZAAR) 50 MG tablet Take 1 tablet (50 mg total) by mouth daily. 30 tablet 4  . ondansetron (ZOFRAN ODT) 4 MG disintegrating tablet Take 1 tablet (4 mg total) by mouth every 8 (eight) hours as needed for  nausea or vomiting. 10 tablet 0  . polyethylene glycol powder (GLYCOLAX/MIRALAX) powder Take 17 g by mouth every other day.    . Probiotic Product (PROBIOTIC-10 ULTIMATE PO) Take by mouth.    Marland Kitchen rOPINIRole (REQUIP) 2 MG tablet TAKE 1 TABLET BY MOUTH AT BEDTIME (Patient taking differently: Take 3 mg by mouth at bedtime. ) 30 tablet 0  . risperiDONE (RISPERDAL) 0.25 MG tablet Take 1 tablet (0.25 mg total) by mouth at bedtime. 30 tablet 1   No current facility-administered medications for this visit.     Medication Side Effects: None  Allergies: No Known Allergies  Past Medical History:  Diagnosis Date  . AAA (abdominal aortic aneurysm) (HCC)    3.1 cm 07/08/18, 3 year follow-up recommended; possible 3 cm AAA by aortogram 09/13/18  . Anemia    PMH  . Appendicitis with abscess    07/08/18, s/p perc drain; resolved 07/30/18 by CT  . Arthritis   . Bipolar disorder ([place])   . Cerebrovascular disease    intra and extracranial vascular dx per MRI 4/11, neurology rec strict CVRF control  . Colonic inertia   .  Constipation    chronic;severe  . Coronary artery disease   . Depression   . Duodenitis   . EKG abnormalities    changes, stress test neg (false EKG changes)  . Gastritis   . GERD (gastroesophageal reflux disease)   . Hypertension   . Hypothyroid 01/16/2014  . Meningioma ([place]) 10/21/2013  . PAD (peripheral artery disease) ([place])   . Psoriasis    sees derm  . Stroke ([place])   . Wears dentures   . Wears glasses     Family History  Problem Relation Age of Onset  . Breast cancer Mother        metastisis to bones  . Diabetes Son   . Heart disease Other        grandfather   . Alcohol abuse Brother   . Heart disease Brother   . Heart disease Maternal Aunt   . Lung cancer Brother        smoked  . Colon cancer Neg Hx     Social History   Socioeconomic History  . Marital status: Widowed    Spouse name: Not on file  . Number of children: 1  . Years of education: Not on file   . Highest education level: Not on file  Occupational History  . Occupation: retired  Scientific laboratory technician  . Financial resource strain: Not hard at all  . Food insecurity:    Worry: Patient refused    Inability: Patient refused  . Transportation needs:    Medical: Patient refused    Non-medical: Patient refused  Tobacco Use  . Smoking status: Current Every Day Smoker    Packs/day: 1.00    Years: 60.00    Pack years: 60.00    Types: Cigarettes  . Smokeless tobacco: Never Used  Substance and Sexual Activity  . Alcohol use: Yes    Alcohol/week: 0.0 standard drinks    Comment: yes on occassion  . Drug use: No  . Sexual activity: Yes  Lifestyle  . Physical activity:    Days per week: Patient refused    Minutes per session: Patient refused  . Stress: Not on file  Relationships  . Social connections:    Talks on phone: Patient refused    Gets together: Patient refused    Attends religious service: Patient refused    Active member of club or organization: Patient refused    Attends meetings of clubs or organizations: Patient refused    Relationship status: Patient refused  . Intimate partner violence:    Fear of current or ex partner: Patient refused    Emotionally abused: Patient refused    Physically abused: Patient refused    Forced sexual activity: Patient refused  Other Topics Concern  . Not on file  Social History Narrative   Brother in law Mr Rexford Maus (one of my patients)   Lives w/ husband        Past Medical History, Surgical history, Social history, and Family history were reviewed and updated as appropriate.   Please see review of systems for further details on the patient's review from today.   Objective:   Physical Exam:  There were no vitals taken for this visit.  Physical Exam Neurological:     Mental Status: She is alert and oriented to person, place, and time.     Cranial Nerves: No dysarthria.  Psychiatric:        Attention and Perception: She  is inattentive. She does not perceive auditory hallucinations.  Mood and Affect: Mood is anxious and depressed.        Speech: Speech normal. Speech is not rapid and pressured or slurred.        Behavior: Behavior is not slowed or aggressive. Behavior is cooperative.        Thought Content: Thought content normal. Thought content is not paranoid or delusional. Thought content does not include homicidal or suicidal ideation. Thought content does not include homicidal or suicidal plan.        Cognition and Memory: Memory is impaired.     Comments: She is somewhat chronically scattered and easily confused and that is continuing but partly related to grief and not sleeping as well lately since Gann died.  Insight and judgment are fair     Lab Review:     Component Value Date/Time   NA 133 (L) 11/20/2018 1842   NA 134 11/13/2018 1659   K 4.3 11/20/2018 1842   CL 100 11/20/2018 1842   CO2 23 11/20/2018 1842   GLUCOSE 100 (H) 11/20/2018 1842   BUN 12 11/20/2018 1842   BUN 17 11/13/2018 1659   CREATININE 0.91 11/20/2018 1842   CREATININE 0.77 07/28/2015 1805   CALCIUM 9.9 11/20/2018 1842   PROT 7.8 11/20/2018 1842   PROT 7.4 03/05/2018 1758   ALBUMIN 3.8 11/20/2018 1842   ALBUMIN 4.4 03/05/2018 1758   AST 24 11/20/2018 1842   ALT 21 11/20/2018 1842   ALKPHOS 100 11/20/2018 1842   BILITOT 0.6 11/20/2018 1842   BILITOT 0.3 03/05/2018 1758   GFRNONAA >60 11/20/2018 1842   GFRAA >60 11/20/2018 1842       Component Value Date/Time   WBC 9.5 11/20/2018 1842   RBC 4.31 11/20/2018 1842   HGB 12.6 11/20/2018 1842   HGB 13.6 03/05/2018 1758   HCT 40.0 11/20/2018 1842   HCT 41.9 03/05/2018 1758   PLT 290 11/20/2018 1842   PLT 241 03/05/2018 1758   MCV 92.8 11/20/2018 1842   MCV 87.1 11/13/2018 1227   MCV 93 03/05/2018 1758   MCH 29.2 11/20/2018 1842   MCHC 31.5 11/20/2018 1842   RDW 13.9 11/20/2018 1842   RDW 14.5 03/05/2018 1758   LYMPHSABS 2.3 11/20/2018 1842   MONOABS  1.0 11/20/2018 1842   EOSABS 0.4 11/20/2018 1842   BASOSABS 0.1 11/20/2018 1842    No results found for: POCLITH, LITHIUM   No results found for: PHENYTOIN, PHENOBARB, VALPROATE, CBMZ   .res Assessment: Plan:    Major depressive disorder, recurrent episode, moderate (HCC) - Plan: risperiDONE (RISPERDAL) 0.25 MG tablet  Generalized anxiety disorder - Plan: risperiDONE (RISPERDAL) 0.25 MG tablet  Bereavement  Restless legs syndrome  Delayed sleep phase syndrome  Mild cognitive impairment  [name] has some chronic depression and chronic irregular sleep patterns which have been resistant to treatment.  Her chronic depression and anxiety are complicated now by double grief of loss of her sister and now her husband.  She is a little more scattered as a result as well and is not sleeping as well.  She called sensor last appointment and wanted her appointment moved up because of feeling that she is not handling grief well is having more mood swings as well as depression and anxiety along with irritability.  She is having problems with concentration making decisions.  T.  Hesitate to increase lamotrigine DT her age and risk of SE including balance and cognitive issues.  Cont counseling for grief, depression, anxiety.  Disc stages of grief  with [name] and sister.  She has a lot of decisions to make and I encouraged her to lean on her family and friends to help her think through these decisions given her mild cognitive impairment status.  And of course the grief affecting her thinking and judgment.  Use pill box to improve compliance.  Discussed serotonin withdrawal symptoms.  Also the risk of greater depression because of missing the meds. Still taking the duloxetine 60+30 mg.  Occ forgets and feels bad.  Disc SSRI withdrawal which she gets when she forgets.  Therefore I am hesitant to increase the dosage further.  The mood lability along with a family history of bipolar disorder makes me think  a small dose of an antipsychotic may have a calming effect.  Because of the restless legs we will be very careful with the dose.  We will go with the lowest dose of risperidone 0.25 mg 1 q. P.m.  If there is no improvement in the next 2 weeks call.  We will then increase the dose  She stopped the donepezil bc GI upset.  Consider Exelon patch for cognition.  But defer this because she is not in a condition to start a new medication at this time.  Sleep hygiene issues discussed although this is been very resistant to change.  We need to make sure she is not drinking too much caffeine especially late in the day.  Continue ropinirole 3 mg for RLS.  This is better controlled her restless legs.  Disc SE incl hallucinations and dizziness.    Call if this fails and will consider doing something with the clonazepam either increasing it or perhaps switching to another benzodiazepine which might have less cognitive problems.  Such as lorazepam  Emphasized importance and proper nutrition and sleep and protein.  Doesn't eat much meat bc constipation but will eat fish.  Discussed how protein is necessary for the proper response to depression medicines  FU as scheduled in the next 4 to 6 weeks  Lynder Parents, MD, DFAPA  Please see After Visit Summary for patient specific instructions.  Future Appointments  Date Time Provider Heath  02/06/2019  2:00 PM Blanchie Serve, PhD CP-CP None  02/07/2019  2:40 PM [name], MD PCP-PCP [facility]  02/27/2019  3:00 PM Cottle, Billey Co., MD CP-CP None  05/01/2019  1:00 PM [facility] HS VASC 3 - EM [facility]  05/01/2019  2:00 PM [facility] HS VASC 3 - EM [facility]  05/01/2019  2:15 PM Nickel, Sharmon Leyden, NP [facility]    No orders of the defined types were placed in this encounter.     -------------------------------

## 2019-02-06 ENCOUNTER — Encounter: Payer: Self-pay | Admitting: Family Medicine

## 2019-02-06 ENCOUNTER — Ambulatory Visit (INDEPENDENT_AMBULATORY_CARE_PROVIDER_SITE_OTHER): Payer: Medicare Other | Admitting: Psychiatry

## 2019-02-06 ENCOUNTER — Other Ambulatory Visit: Payer: Self-pay

## 2019-02-06 DIAGNOSIS — G4721 Circadian rhythm sleep disorder, delayed sleep phase type: Secondary | ICD-10-CM | POA: Diagnosis not present

## 2019-02-06 DIAGNOSIS — Z634 Disappearance and death of family member: Secondary | ICD-10-CM | POA: Diagnosis not present

## 2019-02-06 DIAGNOSIS — F331 Major depressive disorder, recurrent, moderate: Secondary | ICD-10-CM | POA: Diagnosis not present

## 2019-02-06 DIAGNOSIS — F411 Generalized anxiety disorder: Secondary | ICD-10-CM | POA: Diagnosis not present

## 2019-02-06 NOTE — Progress Notes (Signed)
Psychotherapy Progress Note [facility] Jenny Moore, PhD LP  Patient ID: [name]     MRN: [id number]     Therapy format: Individual psychotherapy Date: 02/06/2019     Start: 2:10p Stop: 3:00p Time Spent: 50 min  Telehealth visit - audio only I connected with patient by a video enabled telemedicine/telehealth application or telephone, with her informed consent, and verified patient privacy and that I am speaking with the correct person using two identifiers.  I was located at my office and patient at [name].  We discussed the limitations, risks, and security and privacy concerns associated with telehealth services and the availability of in-person appointments, including awareness that she may be responsible for charges related to the service, and she expressed understanding and agreed to proceed.  I discussed treatment planning with her, with opportunity to ask and answer all questions. Agreed with the plan, demonstrated an understanding of the instructions, and made her aware to call our office if symptoms worsen or she feels she is in a crisis state and needs immediate contact.  Session narrative (presenting needs, interim history, self-report of stressors and symptoms, applications of prior therapy, status changes, and interventions made in session) Given Risperdal 0.5mg  by Dr. Clovis [name], c/o derealization and difficulty today, having dosed 4am this morning before bed.  Rx'd for high anxiety, dfx focusing.  Did sleep effectively 4am-11am.  Typically 6-7 hrs.  Does believe she would be healthier getting her circadian rhythm back to daytime alignment.  Has amber glasses somewhere in the house, not sure where.  Encouraged to find them again and use them -- without guilting herself, just because they are a powerful tool to get what she wants.  [name]'s daughter [name] in the house till tomorrow, been very helpful.  She has been helpful organizing.  Money has been very tight.   Realtor coming over this afternoon, doesn't want to see her, but did schedule it to get an assessment of the house and her prospects.  Has decided to give [name] [name]'s car.  [name] is actually encouraging PT to go back to [name], where old friends are, but things to weigh about where to live.  [name] is comfortable but [name] "boring", [name] more entertaining but [employer]" yards.  Likes the peace of lighting candles and talking to [name] in her quiet back yard.  Awaiting COVID stimulus check.    Otherwise, "I've been feeling bipolar".  Clarified -- not psychotic nor grossly irritable, just set off by ignorant behavior around her, particularly when it comes to virus protection during the pandemic.  Visceral, maternal anger seeing children go unprotected in public by their parents.  Acknowledges "paranoia", i.e., skepticism, but mostly describes brother-in-law next door as distant, standoffish, and two-faced about an offer for her to come live with him, something she temporarily and unknowingly counted on in grief and was predictably disappointed about seeing him back off, true to form for many years.  Did indulge a moment's outrage with him.  Contextualized in grief.  Re. plans, affirmed PT's right to weigh out her situation, not make wholesale decisions about where to live without working out details and knowing well enough what she can count on in terms of relatives' and friends support, validating that all options are mixed, but she can make valid decisions about what matters most to her.  Therapeutic modalities: Cognitive Behavioral Therapy, Ego-Supportive and Grief Therapy  Mental Status/Observations:  Appearance:   Not assessed  Behavior:  Appropriate  Motor:  Not assessed  Speech/Language:   Clear and Coherent  Affect:  Not assessed  Mood:  anxious, sad and appropriate to situation  Thought process:  normal  Thought content:    WNL  Sensory/Perceptual disturbances:     WNL  Orientation:  grossly intact  Attention:  Good  Concentration:  Good  Memory:  grossly intact  Insight:    Good  Judgment:   Good  Impulse Control:  Fair   Risk Assessment: Danger to Self:  No Self-injurious Behavior: No Danger to Others: No Duty to Warn:no Physical Aggression / Violence:No  Access to Firearms a concern: No   Diagnosis:   ICD-10-CM   1. Generalized anxiety disorder F41.1   2. Major depressive disorder, recurrent episode, moderate (HCC) F33.1   3. Bereavement Z63.4   4. Delayed sleep phase syndrome G47.21      Assessment of progress:  stable  Plan:  . Options to halve the dose and/or back up the timing of Risperdal to hold down SE . Options to limber up and breathe a little bigger to offset times of cognitive dulling, see if it energizes . Self-affirm it's OK to feel her feelings, trust time and activities to renormalize, and to talk to [name] ad lib in her time of grieving . Encouraged continue working with step daughter on amicable arrangements, endorsed unilateral generosity with the car . Encouraged to exercise more control over blue light exposure in the evenings and back up bedtime, noting it is "safe" now to live in the daylight (vs. Avoiding now-deceased husband's moods by staying up) . Other recommendations/advice as noted above . Continue to utilize previously learned skills ad lib . Maintain medication as prescribed and work faithfully with relevant prescriber(s) if any changes are desired or seem indicated . Call the clinic on-call service, present to ER, or call 911 if any life-threatening psychiatric crisis Return in about 3 weeks (around 02/27/2019) for set up as teletherapy session.   Jenny Serve, PhD [place] Licensed Psychologist

## 2019-02-07 ENCOUNTER — Other Ambulatory Visit: Payer: Self-pay

## 2019-02-07 ENCOUNTER — Telehealth: Payer: Medicare Other | Admitting: Family Medicine

## 2019-02-13 DIAGNOSIS — T8142XD Infection following a procedure, deep incisional surgical site, subsequent encounter: Secondary | ICD-10-CM | POA: Diagnosis not present

## 2019-02-13 DIAGNOSIS — I2510 Atherosclerotic heart disease of native coronary artery without angina pectoris: Secondary | ICD-10-CM | POA: Diagnosis not present

## 2019-02-13 DIAGNOSIS — I69328 Other speech and language deficits following cerebral infarction: Secondary | ICD-10-CM | POA: Diagnosis not present

## 2019-02-13 DIAGNOSIS — I10 Essential (primary) hypertension: Secondary | ICD-10-CM | POA: Diagnosis not present

## 2019-02-13 DIAGNOSIS — I714 Abdominal aortic aneurysm, without rupture: Secondary | ICD-10-CM | POA: Diagnosis not present

## 2019-02-13 DIAGNOSIS — I700 Atherosclerosis of aorta: Secondary | ICD-10-CM | POA: Diagnosis not present

## 2019-02-17 ENCOUNTER — Other Ambulatory Visit: Payer: Self-pay | Admitting: Psychiatry

## 2019-02-18 ENCOUNTER — Other Ambulatory Visit: Payer: Self-pay

## 2019-02-18 ENCOUNTER — Other Ambulatory Visit: Payer: Self-pay | Admitting: Psychiatry

## 2019-02-18 MED ORDER — ROPINIROLE HCL 2 MG PO TABS: 3.0000 mg | ORAL_TABLET | Freq: Every day | ORAL | 0 refills | Status: DC

## 2019-02-26 DIAGNOSIS — I2510 Atherosclerotic heart disease of native coronary artery without angina pectoris: Secondary | ICD-10-CM | POA: Diagnosis not present

## 2019-02-26 DIAGNOSIS — I69328 Other speech and language deficits following cerebral infarction: Secondary | ICD-10-CM | POA: Diagnosis not present

## 2019-02-26 DIAGNOSIS — I700 Atherosclerosis of aorta: Secondary | ICD-10-CM | POA: Diagnosis not present

## 2019-02-26 DIAGNOSIS — T8142XD Infection following a procedure, deep incisional surgical site, subsequent encounter: Secondary | ICD-10-CM | POA: Diagnosis not present

## 2019-02-26 DIAGNOSIS — I10 Essential (primary) hypertension: Secondary | ICD-10-CM | POA: Diagnosis not present

## 2019-02-26 DIAGNOSIS — I714 Abdominal aortic aneurysm, without rupture: Secondary | ICD-10-CM | POA: Diagnosis not present

## 2019-02-27 ENCOUNTER — Other Ambulatory Visit: Payer: Self-pay

## 2019-02-27 ENCOUNTER — Ambulatory Visit: Payer: Medicare Other | Admitting: Psychiatry

## 2019-02-28 ENCOUNTER — Ambulatory Visit (INDEPENDENT_AMBULATORY_CARE_PROVIDER_SITE_OTHER): Payer: Medicare Other | Admitting: Psychiatry

## 2019-02-28 ENCOUNTER — Other Ambulatory Visit: Payer: Self-pay

## 2019-02-28 ENCOUNTER — Other Ambulatory Visit: Payer: Self-pay | Admitting: Family Medicine

## 2019-02-28 ENCOUNTER — Other Ambulatory Visit: Payer: Self-pay | Admitting: Psychiatry

## 2019-02-28 DIAGNOSIS — F411 Generalized anxiety disorder: Secondary | ICD-10-CM

## 2019-02-28 DIAGNOSIS — F331 Major depressive disorder, recurrent, moderate: Secondary | ICD-10-CM | POA: Diagnosis not present

## 2019-02-28 DIAGNOSIS — Z634 Disappearance and death of family member: Secondary | ICD-10-CM | POA: Diagnosis not present

## 2019-02-28 NOTE — Telephone Encounter (Signed)
Please see message below and advise on Linzess refill

## 2019-02-28 NOTE — Progress Notes (Signed)
Psychotherapy Progress Note [facility] Luan Moore, PhD LP  Patient ID: [name]     MRN: [id number]     Therapy format: Individual psychotherapy Date: 02/28/2019     Start: 3:22p Stop: 4:12p Time Spent: 50 min Location: telehealth   Telehealth visit -- I connected with this patient by an approved telecommunication method (audio only), with her informed consent, and verifying identity and patient privacy.  I was located at my office and patient at her home.  As needed, we discussed the limitations, risks, and security and privacy concerns associated with telehealth service, including the availability and conditions which currently govern in-person appointments and the possibility that 3rd-party payment may not be fully guaranteed and she may be responsible for charges.  After she indicated understanding, we proceeded with the session.  Also discussed treatment planning, as needed, including ongoing verbal agreement with the plan, the opportunity to ask and answer all questions, her demonstrated understanding of instructions, and her readiness to call the office should symptoms worsen or she feels she is in a crisis state and needs more immediate and tangible assistance.  Session narrative (presenting needs, interim history, self-report of stressors and symptoms, applications of prior therapy, status changes, and interventions made in session) Working on painting and preparations to sell the house.  Not please with 1st realtor, and not settled yet whether to return to [place] (expensive proposition, and can't count on people she knows there to be all that close) versus stay and get something smaller, more manageable (not clear who her support is, unknown).  Currently, still lives beside brother-in-law, but he has always been very insular, and it remains a source of other grief to be without sister [name], who died in the last 1-2 yrs.  Knows she wants company.  Nephew [name] will  be coming to visit, which will help, only concern over how their dogs would mix.  Reviewed characteristics, seems like a low-provocation mix, but could try to have an introduction in the back yard before trying to share indoor space.    Interested in increase in Lamotrigine, in process of getting in touch with Dr. Clovis Pu.  Risperidone reacted badly.  Some c/o word-finding, though fluent in session, and mild dfxs with orientation, she says.    Discussed options for living situation, including progressive-care and independent elder community.  Sleep cycle pretty good, bedtime ranging 2am-5am, sleeps till 11am, no more than 1/2 hr variability in wake time.  Still susceptible to wanting to stay up and watch more TV late at night.  Resolved to record anything after 3am and locate her blue blockers again.   Therapeutic modalities: Cognitive Behavioral Therapy, Solution-Oriented/Positive Psychology, Grief Therapy and Psycho-education/Bibliotherapy  Mental Status/Observations:  Appearance:   Not assessed     Behavior:  Appropriate  Motor:  Not assessed  Speech/Language:   Clear and Coherent  Affect:  Not assessed  Mood:  depressed and more mild  Thought process:  normal  Thought content:    WNL  Sensory/Perceptual disturbances:    WNL  Orientation:  grossly intact  Attention:  Good  Concentration:  Fair  Memory:  grossly intact  Insight:    Good  Judgment:   Good  Impulse Control:  Fair   Risk Assessment: Danger to Self:  No Self-injurious Behavior: No Danger to Others: No Duty to Warn:no Physical Aggression / Violence:No  Access to Firearms a concern: No   Diagnosis:   ICD-10-CM   1. Generalized anxiety disorder  F41.1   2. Bereavement  Z63.4   3. Major depressive disorder, recurrent episode, moderate (HCC)  F33.1    Assessment of progress:  stable  Plan:  . Check into living situations . Follow through on scheduling and consulting Dr. Clovis Pu . Other recommendations/advice as  noted above . Continue to utilize previously learned skills ad lib . Maintain medication as prescribed and work faithfully with relevant prescriber(s) if any changes are desired or seem indicated . Call the clinic on-call service, present to ER, or call 911 if any life-threatening psychiatric crisis Return in about 2 weeks (around 03/14/2019) for will call.   Blanchie Serve, PhD Luan Moore, PhD LP Clinical Psychologist, [facility] [address], [place], [place] [address] [phone]

## 2019-02-28 NOTE — Telephone Encounter (Signed)
Patient is wanting a refill of Linzess. Dr. Carlota Raspberry has not prescribed this medication in the past. She states that she needs to medication for her IBS to be able to live normally. She was prescribed the medication while in the hospital. Patient wants a call when this has been resolved. I have scheduled her an appointment with Dr. Carlota Raspberry for June 23 for a virtual.

## 2019-03-01 MED ORDER — LINACLOTIDE 145 MCG PO CAPS: 145.0000 ug | ORAL_CAPSULE | Freq: Every day | ORAL | 0 refills | Status: DC

## 2019-03-01 NOTE — Telephone Encounter (Signed)
Refilled temporarily but this appears to have been prescribed by GI prior. Will need to follow up with them for future refills.

## 2019-03-03 ENCOUNTER — Other Ambulatory Visit: Payer: Self-pay

## 2019-03-03 ENCOUNTER — Telehealth: Payer: Self-pay | Admitting: Gastroenterology

## 2019-03-03 MED ORDER — LINACLOTIDE 145 MCG PO CAPS: 145.0000 ug | ORAL_CAPSULE | Freq: Every day | ORAL | 1 refills | Status: DC

## 2019-03-03 MED ORDER — DULOXETINE HCL 30 MG PO CPEP: 30.0000 mg | ORAL_CAPSULE | Freq: Every evening | ORAL | 1 refills | Status: DC

## 2019-03-03 NOTE — Telephone Encounter (Signed)
Called pt and LM that she is due for an office visit with Dr. Havery Moros.  Asked her to call back to schedule in a couple of weeks when August is open. Sent 30 days of Linzess 13mcg with 1 refill to pharmacy.

## 2019-03-04 ENCOUNTER — Ambulatory Visit: Payer: Medicare Other | Admitting: Family Medicine

## 2019-03-05 ENCOUNTER — Ambulatory Visit: Payer: Medicare Other | Admitting: Family Medicine

## 2019-03-07 ENCOUNTER — Ambulatory Visit (INDEPENDENT_AMBULATORY_CARE_PROVIDER_SITE_OTHER): Payer: Medicare Other | Admitting: Family Medicine

## 2019-03-07 ENCOUNTER — Encounter: Payer: Self-pay | Admitting: Family Medicine

## 2019-03-07 ENCOUNTER — Other Ambulatory Visit: Payer: Self-pay

## 2019-03-07 VITALS — BP 131/72 | HR 77 | Temp 98.5°F | Ht 68.0 in

## 2019-03-07 DIAGNOSIS — E871 Hypo-osmolality and hyponatremia: Secondary | ICD-10-CM

## 2019-03-07 DIAGNOSIS — R05 Cough: Secondary | ICD-10-CM | POA: Diagnosis not present

## 2019-03-07 DIAGNOSIS — R0609 Other forms of dyspnea: Secondary | ICD-10-CM | POA: Diagnosis not present

## 2019-03-07 DIAGNOSIS — I779 Disorder of arteries and arterioles, unspecified: Secondary | ICD-10-CM

## 2019-03-07 DIAGNOSIS — R5383 Other fatigue: Secondary | ICD-10-CM | POA: Diagnosis not present

## 2019-03-07 DIAGNOSIS — F329 Major depressive disorder, single episode, unspecified: Secondary | ICD-10-CM

## 2019-03-07 DIAGNOSIS — I10 Essential (primary) hypertension: Secondary | ICD-10-CM

## 2019-03-07 DIAGNOSIS — R053 Chronic cough: Secondary | ICD-10-CM

## 2019-03-07 DIAGNOSIS — F32A Depression, unspecified: Secondary | ICD-10-CM

## 2019-03-07 MED ORDER — AMLODIPINE BESYLATE 10 MG PO TABS: 10.0000 mg | ORAL_TABLET | Freq: Every day | ORAL | 1 refills | Status: DC

## 2019-03-07 MED ORDER — LOSARTAN POTASSIUM 50 MG PO TABS: 50.0000 mg | ORAL_TABLET | Freq: Every day | ORAL | 1 refills | Status: DC

## 2019-03-07 NOTE — Progress Notes (Signed)
Subjective:    Patient ID: [name], female    DOB: [birth year], 73 y.o.   MRN: [id number]  HPI [name] is a 73 y.o. female Presents today for: Chief Complaint  Patient presents with   Fatigue    1 month plus   Medication Refill    losartin   Fatigue:  Fatigue past month. No chest pain, but has to pause when walking up 1 flight of stairs or  3-4 blocks due to fatigue and some shortness of breath. Uses walking stick.  Still smoking 1/2 ppd (down from 1-1.5 ppd prior to vascular surgery).  Feels like walking better initially after surgery, then worse past month.  History of peripheral vascular disease with popliteal artery bypass graft in February with subsequent wound infection earlier this year. No paroxysmal nocturnal dyspnea.  2 pillows - always used. No orthopnea.  No black stools or blood in stool. On probiotic and linzess for bowel issues.  Chronic dry cough with more cough and cough at night at times past few months. No fever. No sick contacts with Covid 19.  Dx with pulmonary fibrosis, eval in 2017 - Dr. Melvyn Novas. No recent eval.  CXR in 11/06/18 - "COPD, chronic interstitial changes, no active disease" Cardiac eval for preop clearance in January - myocardial perfusion 10/03/18:  The left ventricular ejection fraction is hyperdynamic (>65%).  Nuclear stress EF: 68%. No wall motion abnormality  There was no ST segment deviation noted during stress.  The study is normal.  This is a low risk study. No ischemia, no infarct.  History of depression with husband passing away recently, sister passed away in last 2 years.   Has been followed by psychiatry as well as therapist, and recommended follow-up discussion with her therapist after endorsed increased depression symptoms at her May 22 visit.  June 19 counselor visit. Sleep cycle ok. Planning on med discussion with her psychiatrist.  No change in lamictal, did not tolerate risperdal.  No suicidal intent or plan. Has  had some thoughts about suicide, did not discuss these thoughts with her therapist last Friday. Last fleeting thought earlier this week. Thoughts of what if she were not here. No firearm at home and no specific plan of suicide.   Most recent CBC in March, normal hemoglobin 12.6.  Sodium borderline at 133 in March.  Glucose 100.  Lab Results  Component Value Date   TSH 3.030 03/05/2018      Depression screen [facility] 2/9 03/07/2019 01/31/2019 11/13/2018 11/06/2018 05/30/2018  Decreased Interest 3 3 0 0 0  Down, Depressed, Hopeless _0 0 3  PHQ - 2 Score _1 0 3  Altered sleeping 2 2 - 0 2  Tired, decreased energy 3 3 - 0 3  Change in appetite 3 3 - 0 3  Feeling bad or failure about yourself  2 0 - 0 2  Trouble concentrating 3 3 - 0 0  Moving slowly or fidgety/restless 2 3 - 0 0  Suicidal thoughts 1 3 - 0 1  PHQ-9 Score 22 23 - 0 14  Difficult doing work/chores Very difficult Not difficult at all - Not difficult at all Somewhat difficult  Some recent data might be hidden      Hypertension: BP Readings from Last 3 Encounters:  03/07/19 131/72  01/31/19 (!) 151/69  12/26/18 125/68   Lab Results  Component Value Date   CREATININE 0.91 11/20/2018  home readings: no readings.  No known  new side effects of meds.  On losartan 7m qd, amlodipine 135mqd.  Some swelling of left leg - present since surgery. No new leg swelling.        Patient Active Problem List   Diagnosis Date Noted   Wound infection 11/07/2018   Postoperative pain    Sleep disturbance    Tobacco abuse    Acute blood loss anemia    Hypoalbuminemia due to protein-calorie malnutrition (HCL[facility]   Debility 10/24/2018   Pre-operative cardiovascular examination 09/26/2018   HLD (hyperlipidemia) 09/26/2018   GAD (generalized anxiety disorder) 08/07/2018   Major depressive disorder, single episode 08/07/2018   Appendiceal abscess 07/08/2018   PVD (peripheral vascular disease) ([place]07/01/2018    Atherosclerosis of native artery of left lower extremity with intermittent claudication (HC[place]07/01/2018   Chronic left-sided low back pain with left-sided sciatica 12/25/2017   Facial droop    History of CVA (cerebrovascular accident) 10/23/2017   Critical lower limb ischemia 11/08/2016   Smoker 11/08/2016   Postinflammatory pulmonary fibrosis ([facility]CL[place]06/01/2016   Cigarette smoker 12/24/2015   Hypothyroid ? 01/16/2014   Mild cognitive impairment 01/16/2014   Meningioma (HCFayette[place]02/06/2014   Chest pain 10/20/2013   Medicare annual wellness visit, subsequent 06/16/2013   Dizziness and giddiness 06/16/2013   RLS (restless legs syndrome) 04/29/2012   Abdominal pain 12/27/2010   DEGENERATIVE JOINT DISEASE 09/23/2010   CAROTID ARTERY DISEASE 08/15/2010   CHEST PAIN 08/15/2010   HEADACHE 12/02/2009   BACK PAIN 11/22/2009   CONSTIPATION, CHRONIC 01/29/2008   NAUSEA 01/28/2008   DEPRESSION 03/08/2007   HTN (hypertension) 03/08/2007   Past Medical History:  Diagnosis Date   AAA (abdominal aortic aneurysm) (HCD[place]   3.1 cm 07/08/18, 3 year follow-up recommended; possible 3 cm AAA by aortogram 09/13/18   Anemia    PMH   Appendicitis with abscess    07/08/18, s/p perc drain; resolved 07/30/18 by CT   Arthritis    Bipolar disorder (HCE[place]   Cerebrovascular disease    intra and extracranial vascular dx per MRI 4/11, neurology rec strict CVRF control   Colonic inertia    Constipation    chronic;severe   Coronary artery disease    Depression    Duodenitis    EKG abnormalities    changes, stress test neg (false EKG changes)   Gastritis    GERD (gastroesophageal reflux disease)    Hypertension    Hypothyroid 01/16/2014   Meningioma (HCCl[place]2/06/2014   PAD (peripheral artery disease) ([facility]   Psoriasis    sees derm   [facility] ([facility]   Wears dentures    Wears glasses    Past Surgical History:  Procedure Laterality Date   ABDOMINAL AORTOGRAM  W/LOWER EXTREMITY N/A 09/13/2018   Procedure: ABDOMINAL AORTOGRAM W/LOWER EXTREMITY;  Surgeon: FiElam DutchMD;  [facility];  Service: Cardiovascular;  Laterality: N/A;   arthroscopy  04/2010   Right knee   CATARACT EXTRACTION W/ INTRAOCULAR LENS  IMPLANT, BILATERAL     COLONOSCOPY     FEMORAL-POPLITEAL BYPASS GRAFT  10/22/2018   FEMORAL-POPLITEAL BYPASS GRAFT Left 10/22/2018   Procedure: LEFT FEMORAL TO BELOW THE KNEE POPLITEAL ARTERY BYPASS GRAFT;  Surgeon: FiElam DutchMD;  [facility]: [facility] Service: Vascular;  Laterality: Left;   I&D EXTREMITY Left 11/08/2018   Procedure: IRRIGATION AND DEBRIDEMENT EXTREMITY Left Leg;  Surgeon: FiElam DutchMD;  Location: [facility] Service: Vascular;  Laterality: Left;  IR RADIOLOGIST EVAL & MGMT  07/30/2018   MULTIPLE TOOTH EXTRACTIONS     TUBAL LIGATION     No Known Allergies Prior to Admission medications   Medication Sig Start Date End Date Taking? Authorizing Provider  amLODipine (NORVASC) 10 MG tablet Take 1 tablet (10 mg total) by mouth daily. 10/28/18  Yes Angiulli, [name], PA-C  aspirin EC 81 MG tablet Take 81 mg by mouth daily.   Yes [provider]  Bisacodyl (LAXATIVE PO) Take 1 tablet by mouth daily.   Yes [provider]  clonazePAM (KLONOPIN) 0.5 MG tablet Take 1 tab at bedtime and 1 tab PRN for anxiety.  Please do not take the daytime dose unless absolutely necessary 01/06/19  Yes Cottle, Billey Co., MD  DULoxetine (CYMBALTA) 30 MG capsule Take 1 capsule (30 mg total) by mouth every evening. 03/03/19  Yes Cottle, Billey Co., MD  DULoxetine (CYMBALTA) 60 MG capsule TAKE 1 CAPSULE BY MOUTH EVERY DAY 02/28/19  Yes Cottle, Billey Co., MD  famotidine (PEPCID) 20 MG tablet One at bedtime Patient taking differently: Take 20 mg by mouth daily as needed for heartburn or indigestion (or nausea).  02/14/16  Yes Tanda Rockers, MD  lamoTRIgine (LAMICTAL) 150 MG tablet TAKE 1 TABLET(150 MG) BY  MOUTH TWICE DAILY 02/17/19  Yes Cottle, Billey Co., MD  linaclotide [facility]) 145 MCG CAPS capsule Take 1 capsule (145 mcg total) by mouth at bedtime. 03/03/19  Yes Armbruster, Carlota Raspberry, MD  losartan (COZAAR) 50 MG tablet Take 1 tablet (50 mg total) by mouth daily. 10/25/17  Yes Rai, Ripudeep K, MD  ondansetron (ZOFRAN ODT) 4 MG disintegrating tablet Take 1 tablet (4 mg total) by mouth every 8 (eight) hours as needed for nausea or vomiting. 11/13/18  Yes [name], MD  polyethylene glycol powder (GLYCOLAX/MIRALAX) powder Take 17 g by mouth every other day.   Yes [provider]  Probiotic Product (PROBIOTIC-10 ULTIMATE PO) Take by mouth.   Yes [provider]  rOPINIRole (REQUIP) 2 MG tablet Take 1.5 tablets (3 mg total) by mouth at bedtime. 02/18/19  Yes Cottle, Billey Co., MD  risperiDONE (RISPERDAL) 0.25 MG tablet Take 1 tablet (0.25 mg total) by mouth at bedtime. Patient not taking: Reported on 03/07/2019 02/05/19   Cottle, Billey Co., MD   Social History   Socioeconomic History   Marital status: Widowed    Spouse name: Not on file   Number of children: 1   Years of education: Not on file   Highest education level: Not on file  Occupational History   Occupation: retired  Scientist, product/process development strain: Not hard at [employer] insecurity    Worry: Patient refused    Inability: Patient refused   Transportation needs    Medical: Patient refused    Non-medical: Patient refused  Tobacco Use   Smoking status: Current Every Day Smoker    Packs/day: 1.00    Years: 60.00    Pack years: 60.00    Types: Cigarettes   Smokeless tobacco: Never Used  Substance and Sexual Activity   Alcohol use: Yes    Alcohol/week: 0.0 standard drinks    Comment: yes on occassion   Drug use: No   Sexual activity: Yes  Lifestyle   Physical activity    Days per week: Patient refused    Minutes per session: Patient refused   Stress: Not on file  Relationships  Social Herbalist on phone: Patient refused    Gets together: Patient refused    Attends religious service: Patient refused    Active member of club or organization: Patient refused    Attends meetings of clubs or organizations: Patient refused    Relationship status: Patient refused   Intimate partner violence    Fear of current or ex partner: Patient refused    Emotionally abused: Patient refused    Physically abused: Patient refused    Forced sexual activity: Patient refused  Other Topics Concern   Not on file  Social History Narrative   Brother in Sports coach Mr Rexford Maus (one of my patients)   Lives w/ husband        Review of Systems  Per HPI.     Objective:   Physical Exam Vitals signs reviewed.  Constitutional:      Appearance: She is well-developed.  HENT:     Head: Normocephalic and atraumatic.  Eyes:     Conjunctiva/sclera: Conjunctivae normal.     Pupils: Pupils are equal, round, and reactive to light.  Neck:     Vascular: No carotid bruit.  Cardiovascular:     Rate and Rhythm: Normal rate and regular rhythm.     Heart sounds: Normal heart sounds.  Pulmonary:     Effort: Pulmonary effort is normal. No respiratory distress.     Breath sounds: No stridor. Rales (R greater than left base. ) present. No wheezing or rhonchi.  Abdominal:     Palpations: Abdomen is soft. There is no pulsatile mass.     Tenderness: There is no abdominal tenderness.  Musculoskeletal:     Right lower leg: No edema.     Left lower leg: Edema (trace at ankle. ) present.  Skin:    General: Skin is warm and dry.  Neurological:     Mental Status: She is alert and oriented to person, place, and time.  Psychiatric:        Behavior: Behavior normal.    Vitals:   03/07/19 1642  BP: 131/72  Pulse: 77  Temp: 98.5 F (36.9 C)  TempSrc: Oral  SpO2: 100%  Height: 5' 8" (1.727 m)   EKG: SR, atrial enlargement. Nonspecific st/twaves - seen on prior EKG, no apparent acute  changes.   Over 71mns face to face care with greater than 50% counseling/discussion.      Assessment & Plan:    Jenny WIENSis a 73y.o. female Fatigue, unspecified type - Plan: EKG 170-YOVZ Basic metabolic panel, CBC, TSH, CANCELED: CBC, CANCELED: Basic metabolic panel, CANCELED: TSH Chronic cough - Plan: EKG 12-Lead, Ambulatory referral to Pulmonology, DG Chest 2 View, CANCELED: DG Chest 2 View DOE (dyspnea on exertion) - Plan: EKG 12-Lead, Pro b natriuretic peptide, Ambulatory referral to Pulmonology, CANCELED: Pro b natriuretic peptide Hyponatremia - Plan: EKG 185-YIFO Basic metabolic panel, CANCELED: Basic metabolic panel  -Possibly multifactorial with prior surgery, deconditioning, but had improved symptoms initially after popliteal artery bypass.  Stress testing reassuring from January.  Less likely cardiac, no acute findings on EKG.  History of pulmonary fibrosis with possible COPD versus scarring on chest x-ray earlier this year.  Few rales on exam, afebrile, doubt infectious  -Check BNP, chest x-ray to rule out CHF but less likely.   -Refer back to pulmonary to evaluate dyspnea exertion and cough COPD versus pulmonary fibrosis with worsening symptoms.  -Check CBC, BMP with hyponatremia noted on prior labs, and borderline glucose.  -  Previous TSH reassuring, but recheck to evaluate thyroid cause  -Follow-up with psychiatrist to discuss depression treatment and counselor to discuss her symptoms.  ER/911 suicidal ideation precautions.  -Follow-up to be determined after blood work, x-ray.  ER precautions of acute worsening  Essential hypertension - Plan: amLODipine (NORVASC) 10 MG tablet, losartan (COZAAR) 50 MG tablet, EKG 12-Lead  -Stable, continue same regimen.  Labs pending as above  Depression, unspecified depression type - Plan: TSH, CANCELED: TSH  -Check TSH, but plan for psychiatry follow up.   Meds ordered this encounter  Medications   amLODipine (NORVASC) 10 MG tablet     Sig: Take 1 tablet (10 mg total) by mouth daily.    Dispense:  90 tablet    Refill:  1   losartan (COZAAR) 50 MG tablet    Sig: Take 1 tablet (50 mg total) by mouth daily.    Dispense:  90 tablet    Refill:  1   Patient Instructions   No change in blood pressure meds for now.    Call your psychiatrist to discuss possible med changes as discussed with your therapist.  Please discuss the suicide thoughts with your therapist, but if those return - call 911 or go to emergency room.   Chest x-ray ordered at [facility].  I did refer you back to pulmonary.  We will schedule you for blood work to be drawn Monday.  If any worsening symptoms over the weekend, proceed to the emergency room.  Depending on blood work can decide on next step in treatment.  Return to the clinic or go to the nearest emergency room if any of your symptoms worsen or new symptoms occur.    Fatigue If you have fatigue, you feel tired all the time and have a lack of energy or a lack of motivation. Fatigue may make it difficult to start or complete tasks because of exhaustion. In general, occasional or mild fatigue is often a normal response to activity or life. However, long-lasting (chronic) or extreme fatigue may be a symptom of a medical condition. Follow these instructions at home: General instructions  Watch your fatigue for any changes.  Go to bed and get up at the same time every day.  Avoid fatigue by pacing yourself during the day and getting enough sleep at night.  Maintain a healthy weight. Medicines  Take over-the-counter and prescription medicines only as told by your health care provider.  Take a multivitamin, if told by your health care provider.  Do not use herbal or dietary supplements unless they are approved by your health care provider. Activity   Exercise regularly, as told by your health care provider.  Use or practice techniques to help you relax, such as yoga, tai chi,  meditation, or massage therapy. Eating and drinking   Avoid heavy meals in the evening.  Eat a well-balanced diet, which includes lean proteins, whole grains, plenty of fruits and vegetables, and low-fat dairy products.  Avoid consuming too much caffeine.  Avoid the use of alcohol.  Drink enough fluid to keep your urine pale yellow. Lifestyle  Change situations that cause you stress. Try to keep your work and personal schedule in balance.  Do not use any products that contain nicotine or tobacco, such as cigarettes and e-cigarettes. If you need help quitting, ask your health care provider.  Do not use drugs. Contact a health care provider if:  Your fatigue does not get better.  You have a fever.  You  suddenly lose or gain weight.  You have headaches.  You have trouble falling asleep or sleeping through the night.  You feel angry, guilty, anxious, or sad.  You are unable to have a bowel movement (constipation).  Your skin is dry.  You have swelling in your legs or another part of your body. Get help right away if:  You feel confused.  Your vision is blurry.  You feel faint or you pass out.  You have a severe headache.  You have severe pain in your abdomen, your back, or the area between your waist and hips (pelvis).  You have chest pain, shortness of breath, or an irregular or fast heartbeat.  You are unable to urinate, or you urinate less than normal.  You have abnormal bleeding, such as bleeding from the rectum, vagina, nose, lungs, or nipples.  You vomit blood.  You have thoughts about hurting yourself or others. If you ever feel like you may hurt yourself or others, or have thoughts about taking your own life, get help right away. You can go to your nearest emergency department or call:  Your local emergency services (911 in the [place]  A suicide crisis helpline, such as the [place] at (228)678-4652. This is open 24  hours a day. Summary  If you have fatigue, you feel tired all the time and have a lack of energy or a lack of motivation.  Fatigue may make it difficult to start or complete tasks because of exhaustion.  Long-lasting (chronic) or extreme fatigue may be a symptom of a medical condition.  Exercise regularly, as told by your health care provider.  Change situations that cause you stress. Try to keep your work and personal schedule in balance. This information is not intended to replace advice given to you by your health care provider. Make sure you discuss any questions you have with your health care provider. Document Released: 06/25/2007 Document Revised: 12/19/2018 Document Reviewed: 05/23/2017 Elsevier Patient Education  [employer].    If you have lab work done today you will be contacted with your lab results within the next 2 weeks.  If you have not heard from [place] then please contact us. The fastest way to get your results is to register for My Chart.   IF you received an x-ray today, you will receive an invoice from [facility]. Please contact [facility] at [phone] with questions or concerns regarding your invoice.   IF you received labwork today, you will receive an invoice from [facility]. Please contact [facility] at [phone] with questions or concerns regarding your invoice.   Our billing staff will not be able to assist you with questions regarding bills from these companies.  You will be contacted with the lab results as soon as they are available. The fastest way to get your results is to activate your My Chart account. Instructions are located on the last page of this paperwork. If you have not heard from [place] regarding the results in 2 weeks, please contact this office.                 Signed,   Merri Ray, MD [facility].  03/07/19 5:49 PM

## 2019-03-07 NOTE — Patient Instructions (Addendum)
No change in blood pressure meds for now.    Call your psychiatrist to discuss possible med changes as discussed with your therapist.  Please discuss the suicide thoughts with your therapist, but if those return - call 911 or go to emergency room.   Chest x-ray ordered at [place].  I did refer you back to pulmonary.  We will schedule you for blood work to be drawn Monday.  If any worsening symptoms over the weekend, proceed to the emergency room.  Depending on blood work can decide on next step in treatment.  Return to the clinic or go to the nearest emergency room if any of your symptoms worsen or new symptoms occur.    Fatigue If you have fatigue, you feel tired all the time and have a lack of energy or a lack of motivation. Fatigue may make it difficult to start or complete tasks because of exhaustion. In general, occasional or mild fatigue is often a normal response to activity or life. However, long-lasting (chronic) or extreme fatigue may be a symptom of a medical condition. Follow these instructions at home: General instructions  Watch your fatigue for any changes.  Go to bed and get up at the same time every day.  Avoid fatigue by pacing yourself during the day and getting enough sleep at night.  Maintain a healthy weight. Medicines  Take over-the-counter and prescription medicines only as told by your health care provider.  Take a multivitamin, if told by your health care provider.  Do not use herbal or dietary supplements unless they are approved by your health care provider. Activity   Exercise regularly, as told by your health care provider.  Use or practice techniques to help you relax, such as yoga, tai chi, meditation, or massage therapy. Eating and drinking   Avoid heavy meals in the evening.  Eat a well-balanced diet, which includes lean proteins, whole grains, plenty of fruits and vegetables, and low-fat dairy products.  Avoid consuming too  much caffeine.  Avoid the use of alcohol.  Drink enough fluid to keep your urine pale yellow. Lifestyle  Change situations that cause you stress. Try to keep your work and personal schedule in balance.  Do not use any products that contain nicotine or tobacco, such as cigarettes and e-cigarettes. If you need help quitting, ask your health care provider.  Do not use drugs. Contact a health care provider if:  Your fatigue does not get better.  You have a fever.  You suddenly lose or gain weight.  You have headaches.  You have trouble falling asleep or sleeping through the night.  You feel angry, guilty, anxious, or sad.  You are unable to have a bowel movement (constipation).  Your skin is dry.  You have swelling in your legs or another part of your body. Get help right away if:  You feel confused.  Your vision is blurry.  You feel faint or you pass out.  You have a severe headache.  You have severe pain in your abdomen, your back, or the area between your waist and hips (pelvis).  You have chest pain, shortness of breath, or an irregular or fast heartbeat.  You are unable to urinate, or you urinate less than normal.  You have abnormal bleeding, such as bleeding from the rectum, vagina, nose, lungs, or nipples.  You vomit blood.  You have thoughts about hurting yourself or others. If you ever feel like you may hurt yourself or others, or  have thoughts about taking your own life, get help right away. You can go to your nearest emergency department or call:  Your local emergency services (911 in the [place]  A suicide crisis helpline, such as the [facility] at 531-542-2192. This is open 24 hours a day. Summary  If you have fatigue, you feel tired all the time and have a lack of energy or a lack of motivation.  Fatigue may make it difficult to start or complete tasks because of exhaustion.  Long-lasting (chronic) or extreme fatigue  may be a symptom of a medical condition.  Exercise regularly, as told by your health care provider.  Change situations that cause you stress. Try to keep your work and personal schedule in balance. This information is not intended to replace advice given to you by your health care provider. Make sure you discuss any questions you have with your health care provider. Document Released: 06/25/2007 Document Revised: 12/19/2018 Document Reviewed: 05/23/2017 Elsevier Patient Education  [employer].    If you have lab work done today you will be contacted with your lab results within the next 2 weeks.  If you have not heard from [place] then please contact us. The fastest way to get your results is to register for My Chart.   IF you received an x-ray today, you will receive an invoice from [facility]. Please contact [facility] at [phone] with questions or concerns regarding your invoice.   IF you received labwork today, you will receive an invoice from [facility]. Please contact [facility] at [phone] with questions or concerns regarding your invoice.   Our billing staff will not be able to assist you with questions regarding bills from these companies.  You will be contacted with the lab results as soon as they are available. The fastest way to get your results is to activate your My Chart account. Instructions are located on the last page of this paperwork. If you have not heard from [place] regarding the results in 2 weeks, please contact this office.

## 2019-03-10 ENCOUNTER — Other Ambulatory Visit: Payer: Self-pay

## 2019-03-10 ENCOUNTER — Ambulatory Visit (INDEPENDENT_AMBULATORY_CARE_PROVIDER_SITE_OTHER): Payer: Medicare Other | Admitting: Family Medicine

## 2019-03-10 DIAGNOSIS — R5383 Other fatigue: Secondary | ICD-10-CM | POA: Diagnosis not present

## 2019-03-10 DIAGNOSIS — E871 Hypo-osmolality and hyponatremia: Secondary | ICD-10-CM | POA: Diagnosis not present

## 2019-03-10 DIAGNOSIS — F329 Major depressive disorder, single episode, unspecified: Secondary | ICD-10-CM

## 2019-03-10 DIAGNOSIS — R0609 Other forms of dyspnea: Secondary | ICD-10-CM | POA: Diagnosis not present

## 2019-03-10 DIAGNOSIS — F32A Depression, unspecified: Secondary | ICD-10-CM

## 2019-03-11 LAB — CBC
Hematocrit: 41.2 % (ref 34.0–46.6)
Hemoglobin: 13.6 g/dL (ref 11.1–15.9)
MCH: 28.7 pg (ref 26.6–33.0)
MCHC: 33 g/dL (ref 31.5–35.7)
MCV: 87 fL (ref 79–97)
Platelets: 258 10*3/uL (ref 150–450)
RBC: 4.74 x10E6/uL (ref 3.77–5.28)
RDW: 14.4 % (ref 11.7–15.4)
WBC: 7.9 10*3/uL (ref 3.4–10.8)

## 2019-03-11 LAB — BASIC METABOLIC PANEL
BUN/Creatinine Ratio: 19 (ref 12–28)
BUN: 17 mg/dL (ref 8–27)
CO2: 27 mmol/L (ref 20–29)
Calcium: 9.7 mg/dL (ref 8.7–10.3)
Chloride: 95 mmol/L — ABNORMAL LOW (ref 96–106)
Creatinine, Ser: 0.89 mg/dL (ref 0.57–1.00)
GFR calc Af Amer: 73 mL/min/{1.73_m2} (ref >59)
GFR calc non Af Amer: 64 mL/min/{1.73_m2} (ref >59)
Glucose: 49 mg/dL — ABNORMAL LOW (ref 65–99)
Potassium: 4.3 mmol/L (ref 3.5–5.2)
Sodium: 139 mmol/L (ref 134–144)

## 2019-03-11 LAB — TSH: TSH: 3.33 u[IU]/mL (ref 0.450–4.500)

## 2019-03-11 LAB — PRO B NATRIURETIC PEPTIDE: NT-Pro BNP: 263 pg/mL (ref 0–738)

## 2019-03-12 ENCOUNTER — Ambulatory Visit
Admission: RE | Admit: 2019-03-12 | Discharge: 2019-03-12 | Disposition: A | Discharge status: Home / Self Care | Payer: Medicare Other | Source: Ambulatory Visit | Attending: Family Medicine | Admitting: Family Medicine

## 2019-03-12 DIAGNOSIS — R05 Cough: Secondary | ICD-10-CM

## 2019-03-12 DIAGNOSIS — R053 Chronic cough: Secondary | ICD-10-CM

## 2019-03-14 ENCOUNTER — Encounter: Payer: Self-pay | Admitting: Family Medicine

## 2019-03-17 NOTE — Telephone Encounter (Signed)
ERROR

## 2019-03-18 ENCOUNTER — Encounter: Payer: Self-pay | Admitting: Nurse Practitioner

## 2019-03-18 ENCOUNTER — Ambulatory Visit (INDEPENDENT_AMBULATORY_CARE_PROVIDER_SITE_OTHER): Payer: Medicare Other | Admitting: Nurse Practitioner

## 2019-03-18 ENCOUNTER — Other Ambulatory Visit: Payer: Self-pay

## 2019-03-18 VITALS — Ht 68.0 in | Wt 165.0 lb

## 2019-03-18 DIAGNOSIS — I779 Disorder of arteries and arterioles, unspecified: Secondary | ICD-10-CM | POA: Diagnosis not present

## 2019-03-18 DIAGNOSIS — K5909 Other constipation: Secondary | ICD-10-CM | POA: Diagnosis not present

## 2019-03-18 MED ORDER — LINACLOTIDE 290 MCG PO CAPS: 290.0000 ug | ORAL_CAPSULE | Freq: Every day | ORAL | 3 refills | Status: DC

## 2019-03-18 NOTE — Progress Notes (Signed)
Agree with assessment and plan as outlined.  

## 2019-03-18 NOTE — Progress Notes (Signed)
Telephonic Visit in setting of [place]  Patient has given consent for a telephonic visit today and understands that is the same as is required for any face-to-face patient encounter except that the service was provided via telephone.   At the time of this telephonic visit the patient was located at home and I, the Provider, at the office of [facility].   No one other than the patient and I participated in this telephonic visit today.   Total time of telephonic visit :  12  minutes.    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  Finally doing well with combination of probiotic and Linzess though she is taking 2 of the 145 mcg of Linzess.  Needs a refill.  -Refill Linzess, will increase dose to 290 mcg daily on an empty stomach.  Often times Linzess is not dose dependent but the higher dose is working nicely for her.  HPI:     Patient is a 73 year old female previously followed by Dr. Olevia Perches now Dr. Havery Moros.  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  Last attempt at colonoscopy was in 2009. Exam only to hepatic flexure as colon was tortuous / hypotonic. She had an unsuccessful colonoscopy in 2009She hasn't been able to have a colonoscopy in yearsUnsuccessful attempt at colonoscopy May 2009. Virtual colonoscopy in February 2011 negative for clinically significant polyps / masses or strictures.  Colon noted to be markedly tortuous throughout. Through the years she has utilize a variety of laxatives including Senokot, magnesium supplements, MiraLAX, and Dulcolax.  She periodically purges self with bowel prep as she did last night with Mg citrate.  Husband died recently so her diet is been poor but she generally avoids breads, chocolate and other culprit foods.   A few weeks ago patient was started on  Linzess 145 mcg.  She has found that doubling dose in addition to a probiotic works very well.  She had to purge herself last night but attributes that to not being diligent with her diet.   Patient's medical, surgical history, family medical history, social history, medications and allergies were all reviewed in Epic   Serum creatinine: 0.89 mg/dL 03/10/19 1438 Estimated creatinine clearance: 55.1 mL/min  Current Outpatient Medications  Medication Sig Dispense Refill  . amLODipine (NORVASC) 10 MG tablet Take 1 tablet (10 mg total) by mouth daily. 90 tablet 1  . aspirin EC 81 MG tablet Take 81 mg by mouth daily.    . clonazePAM (KLONOPIN) 0.5 MG tablet Take 1 tab at bedtime and 1 tab PRN for anxiety.  Please do not take the daytime dose unless absolutely necessary 40 tablet 1  . DULoxetine (CYMBALTA) 30 MG capsule Take 1 capsule (30 mg total) by mouth every evening. 90 capsule 1  . DULoxetine (CYMBALTA) 60 MG capsule TAKE 1 CAPSULE BY MOUTH EVERY DAY 90 capsule 1  . famotidine (PEPCID) 20 MG tablet One at bedtime (Patient taking differently: Take 20 mg by mouth daily as needed for heartburn or indigestion (or nausea). ) 30 tablet 11  . lamoTRIgine (LAMICTAL) 150 MG tablet TAKE 1 TABLET(150 MG) BY MOUTH TWICE DAILY 180 tablet 0  . linaclotide (LINZESS) 145 MCG CAPS capsule Take 1 capsule (145 mcg total) by mouth at bedtime. (Patient taking differently: Take 290 mcg by mouth at bedtime. )  30 capsule 1  . losartan (COZAAR) 50 MG tablet Take 1 tablet (50 mg total) by mouth daily. 90 tablet 1  . polyethylene glycol powder (GLYCOLAX/MIRALAX) powder Take 17 g by mouth every other day.    . Probiotic Product (PROBIOTIC-10 ULTIMATE PO) Take by mouth.    Marland Kitchen rOPINIRole (REQUIP) 2 MG tablet Take 1.5 tablets (3 mg total) by mouth at bedtime. 135 tablet 0   No current facility-administered medications for this visit.     Physical Exam:     Ht 5\' 8"  (1.727 m)   Wt 165 lb (74.8 kg)   BMI 25.09 kg/m    Physical Exam: N/A  Tye Savoy , NP 03/18/2019, 3:47 PM

## 2019-03-18 NOTE — Patient Instructions (Addendum)
If you are age 75 or older, your body mass index should be between 23-30. Your Body mass index is 25.09 kg/m. If this is out of the aforementioned range listed, please consider follow up with your Primary Care Provider.  If you are age 79 or younger, your body mass index should be between 19-25. Your Body mass index is 25.09 kg/m. If this is out of the aformentioned range listed, please consider follow up with your Primary Care Provider.   We have sent the following medications to your pharmacy for you to pick up at your convenience: LInzess 290 mcg once daily.  Follow up as needed.  Thank you for choosing me and [facility].   Tye Savoy, NP

## 2019-03-19 ENCOUNTER — Encounter: Payer: Self-pay | Admitting: Family Medicine

## 2019-03-19 NOTE — Progress Notes (Signed)
Lab visit only. 

## 2019-03-31 ENCOUNTER — Telehealth (INDEPENDENT_AMBULATORY_CARE_PROVIDER_SITE_OTHER): Payer: Medicare Other | Admitting: Family Medicine

## 2019-03-31 VITALS — BP 131/72 | Temp 98.1°F | Ht 68.0 in | Wt 160.0 lb

## 2019-03-31 DIAGNOSIS — R42 Dizziness and giddiness: Secondary | ICD-10-CM | POA: Diagnosis not present

## 2019-03-31 DIAGNOSIS — I779 Disorder of arteries and arterioles, unspecified: Secondary | ICD-10-CM

## 2019-03-31 DIAGNOSIS — F329 Major depressive disorder, single episode, unspecified: Secondary | ICD-10-CM

## 2019-03-31 DIAGNOSIS — R5383 Other fatigue: Secondary | ICD-10-CM | POA: Diagnosis not present

## 2019-03-31 DIAGNOSIS — E162 Hypoglycemia, unspecified: Secondary | ICD-10-CM

## 2019-03-31 DIAGNOSIS — R0609 Other forms of dyspnea: Secondary | ICD-10-CM

## 2019-03-31 NOTE — Progress Notes (Signed)
Virtual Visit via Telephone Note  I connected with [name] on 03/31/19 at 5:50 PM by telephone and verified that I am speaking with the correct person using two identifiers.   I discussed the limitations, risks, security and privacy concerns of performing an evaluation and management service by telephone and the availability of in person appointments. I also discussed with the patient that there may be a patient responsible charge related to this service. The patient expressed understanding and agreed to proceed, consent obtained  Chief complaint: CC: patient is following up today for fatigue, still not feeling any better she states she can't seem to get through daily life.    History of Present Illness: [name] is a 73 y.o. female   Last seen June 26 for persistent fatigue.  Thought to be possible multifactorial with prior surgery and deconditioning, psychiatric component with depression, grief with loss of her husband.  Less likely cardiac with reassuring stress testing from January.  No acute findings on EKG at that time.  History of pulmonary fibrosis with possible COPD versus scarring on previous chest x-ray.  Repeat x-ray with chronic interstitial lung markings with atelectasis versus scarring at the bases.  Normal CBC, proBNP, TSH.  Glucose slightly low at 49, remainder of basic metabolic panel reassuring.  I did asked that she follow-up within 1 week to repeat blood sugar testing.  She is not on insulin, but question whether her other medications may have contributed.  Advised to eat regular meals throughout the day.  Asked that she follow-up with psychiatry to discuss her depression treatment and counseling.  She was referred to pulmonary given the interstitial lung markings and possible pulmonary fibrosis along with dyspnea on exertion.  Since last visit - Feels about the same. Some sciatica pain at times.  Still fatigue, similar shortness of breath for months. No  chest pain.  Some nights able to go for a walk, sometimes has to stop every 10 feet to catch her breath. No chest pains. No new calf pain or swelling, but some pain in upper leg at incision for prior PVD.  Diet: 1-2 meals per day. Irregular eating.  Occasional dizziness.  No focal weakness, no slurred speech.   Still feels depression is terrible.No suicidal thoughts. Has not met with psychiatrist since last visit. Has not talked to counselor since our last visit. Procrastinating.      Patient Active Problem List   Diagnosis Date Noted  . Wound infection 11/07/2018  . Postoperative pain   . Sleep disturbance   . Tobacco abuse   . Acute blood loss anemia   . Hypoalbuminemia due to protein-calorie malnutrition ([place])   . Debility 10/24/2018  . Pre-operative cardiovascular examination 09/26/2018  . HLD (hyperlipidemia) 09/26/2018  . GAD (generalized anxiety disorder) 08/07/2018  . Major depressive disorder, single episode 08/07/2018  . Appendiceal abscess 07/08/2018  . PVD (peripheral vascular disease) ([place]) 03/15/2018  . Atherosclerosis of native artery of left lower extremity with intermittent claudication ([place]) 03/15/2018  . Chronic left-sided low back pain with left-sided sciatica 12/25/2017  . Facial droop   . History of CVA (cerebrovascular accident) 10/23/2017  . Critical lower limb ischemia 11/08/2016  . Smoker 11/08/2016  . 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 Cigarette smoker 12/24/2015  . Hypothyroid ? 01/16/2014  . Mild cognitive impairment 01/16/2014  . Meningioma ([place]) 10/21/2013  . Chest pain 10/20/2013  . Medicare annual wellness visit, subsequent 06/16/2013  . Dizziness and giddiness 06/16/2013  .  RLS (restless legs syndrome) 04/29/2012  . Abdominal pain 12/27/2010  . DEGENERATIVE JOINT DISEASE 09/23/2010  . CAROTID ARTERY DISEASE 08/15/2010  . CHEST PAIN 08/15/2010  . HEADACHE 12/02/2009  . BACK PAIN 11/22/2009  . CONSTIPATION, CHRONIC  01/29/2008  . NAUSEA 01/28/2008  . DEPRESSION 03/08/2007  . HTN (hypertension) 03/08/2007   Past Medical History:  Diagnosis Date  . AAA (abdominal aortic aneurysm) (HCC)    3.1 cm 07/08/18, 3 year follow-up recommended; possible 3 cm AAA by aortogram 09/13/18  . Anemia    PMH  . Appendicitis with abscess    07/08/18, s/p perc drain; resolved 07/30/18 by CT  . Arthritis   . Bipolar disorder ([place])   . Cerebrovascular disease    intra and extracranial vascular dx per MRI 4/11, neurology rec strict CVRF control  . Colonic inertia   . Constipation    chronic;severe  . Coronary artery disease   . Depression   . Duodenitis   . EKG abnormalities    changes, stress test neg (false EKG changes)  . Gastritis   . GERD (gastroesophageal reflux disease)   . Hypertension   . Hypothyroid 01/16/2014  . Meningioma ([place]) 10/21/2013  . PAD (peripheral artery disease) ([place])   . Psoriasis    sees derm  . Stroke ([place])   . Wears dentures   . Wears glasses    Past Surgical History:  Procedure Laterality Date  . ABDOMINAL AORTOGRAM W/LOWER EXTREMITY N/A 09/13/2018   Procedure: ABDOMINAL AORTOGRAM W/LOWER EXTREMITY;  Surgeon: Elam Dutch, MD;  Location: [facility];  Service: Cardiovascular;  Laterality: N/A;  . arthroscopy  04/2010   Right knee  . CATARACT EXTRACTION W/ INTRAOCULAR LENS  IMPLANT, BILATERAL    . COLONOSCOPY    . FEMORAL-POPLITEAL BYPASS GRAFT  10/22/2018  . FEMORAL-POPLITEAL BYPASS GRAFT Left 10/22/2018   Procedure: LEFT FEMORAL TO BELOW THE KNEE POPLITEAL ARTERY BYPASS GRAFT;  Surgeon: Elam Dutch, MD;  Location: [facility];  Service: Vascular;  Laterality: Left;  . I&D EXTREMITY Left 11/08/2018   Procedure: IRRIGATION AND DEBRIDEMENT EXTREMITY Left Leg;  Surgeon: Elam Dutch, MD;  Location: [facility];  Service: Vascular;  Laterality: Left;  . IR RADIOLOGIST EVAL & MGMT  07/30/2018  . MULTIPLE TOOTH EXTRACTIONS    . TUBAL LIGATION     No Known Allergies Prior to  Admission medications   Medication Sig Start Date End Date Taking? Authorizing Provider  amLODipine (NORVASC) 10 MG tablet Take 1 tablet (10 mg total) by mouth daily. 03/07/19   [name], MD  aspirin EC 81 MG tablet Take 81 mg by mouth daily.    [provider]  clonazePAM (KLONOPIN) 0.5 MG tablet Take 1 tab at bedtime and 1 tab PRN for anxiety.  Please do not take the daytime dose unless absolutely necessary 01/06/19   Cottle, Billey Co., MD  DULoxetine (CYMBALTA) 30 MG capsule Take 1 capsule (30 mg total) by mouth every evening. 03/03/19   Cottle, Billey Co., MD  DULoxetine (CYMBALTA) 60 MG capsule TAKE 1 CAPSULE BY MOUTH EVERY DAY 02/28/19   Cottle, Billey Co., MD  famotidine (PEPCID) 20 MG tablet One at bedtime Patient taking differently: Take 20 mg by mouth daily as needed for heartburn or indigestion (or nausea).  02/14/16   Tanda Rockers, MD  lamoTRIgine (LAMICTAL) 150 MG tablet TAKE 1 TABLET(150 MG) BY MOUTH TWICE DAILY 02/17/19   Cottle, Billey Co., MD  linaclotide Rolan Lipa)  290 MCG CAPS capsule Take 1 capsule (290 mcg total) by mouth daily before breakfast. 03/18/19   Willia Craze, NP  losartan (COZAAR) 50 MG tablet Take 1 tablet (50 mg total) by mouth daily. 03/07/19   [name], MD  polyethylene glycol powder (GLYCOLAX/MIRALAX) powder Take 17 g by mouth every other day.    [provider]  Probiotic Product (PROBIOTIC-10 ULTIMATE PO) Take by mouth.    [provider]  rOPINIRole (REQUIP) 2 MG tablet Take 1.5 tablets (3 mg total) by mouth at bedtime. 02/18/19   Cottle, Billey Co., MD   Social History   Socioeconomic History  . Marital status: Widowed    Spouse name: Not on file  . Number of children: 1  . Years of education: Not on file  . Highest education level: Not on file  Occupational History  . Occupation: retired  Scientific laboratory technician  . Financial resource strain: Not hard at all  . Food insecurity    Worry: Patient refused     Inability: Patient refused  . Transportation needs    Medical: Patient refused    Non-medical: Patient refused  Tobacco Use  . Smoking status: Current Every Day Smoker    Packs/day: 1.00    Years: 60.00    Pack years: 60.00    Types: Cigarettes  . Smokeless tobacco: Never Used  Substance and Sexual Activity  . Alcohol use: Yes    Alcohol/week: 0.0 standard drinks    Comment: yes on occassion  . Drug use: No  . Sexual activity: Yes  Lifestyle  . Physical activity    Days per week: Patient refused    Minutes per session: Patient refused  . Stress: Not on file  Relationships  . Social Herbalist on phone: Patient refused    Gets together: Patient refused    Attends religious service: Patient refused    Active member of club or organization: Patient refused    Attends meetings of clubs or organizations: Patient refused    Relationship status: Patient refused  . Intimate partner violence    Fear of current or ex partner: Patient refused    Emotionally abused: Patient refused    Physically abused: Patient refused    Forced sexual activity: Patient refused  Other Topics Concern  . Not on file  Social History Narrative   Brother in law Mr Rexford Maus (one of my patients)   Lives w/ husband         Observations/Objective: Speaking in complete sentences without apparent respiratory distress.  Appropriate responses.  Denies suicidal ideation.  Assessment and Plan: Fatigue, unspecified type - Plan: CBC DOE (dyspnea on exertion) - Plan: D-dimer, quantitative (not at [facility]), Ambulatory referral to Cardiology,  Hypoglycemia - Plan: Basic metabolic panel,  Episode of dizziness - Plan:   -Persistent fatigue with episodic dyspnea exertion, few episodes of lightheadedness.  Previous work-up as above.  Denies chest pain or calf pain/swelling, but with prior surgery will go ahead and order d-dimer, consider CT chest if elevated.  -Hypoglycemia noted on previous labs, again  discussed with patient need for regular meals.  If symptoms persist, could be due to poor medications as well.  ER precautions given if similar symptoms of hypoglycemia return  -Pulmonary eval pending, verified with schedulers and appointment scheduled  -Recheck CBC, BMP with plan for lab only visit.  -Consider cardiology eval if persistent symptoms or accompanied with any chest pain but ER/911 precautions also discussed.  Major depressive disorder, remission status unspecified, unspecified whether recurrent - Plan:   -Could also be contributing to fatigue.  Stressed importance again of discussing her symptoms with her therapist as well as psychiatrist to review her current medication regimen.  Follow Up Instructions: tbd after labs.     I discussed the assessment and treatment plan with the patient. The patient was provided an opportunity to ask questions and all were answered. The patient agreed with the plan and demonstrated an understanding of the instructions.   The patient was advised to call back or seek an in-person evaluation if the symptoms worsen or if the condition fails to improve as anticipated.  I provided 21 minutes of non-face-to-face time during this encounter.  Signed,   Merri Ray, MD [facility].  03/31/19

## 2019-04-02 ENCOUNTER — Other Ambulatory Visit: Payer: Self-pay | Admitting: Family Medicine

## 2019-04-02 NOTE — Patient Instructions (Signed)
Left a msg for patient supposed to have labs done on 04/01/19 and was calling to see when she will be able to come into the office for a lab only visit.

## 2019-04-03 ENCOUNTER — Ambulatory Visit: Payer: Medicare Other

## 2019-04-04 ENCOUNTER — Ambulatory Visit (INDEPENDENT_AMBULATORY_CARE_PROVIDER_SITE_OTHER): Payer: Medicare Other | Admitting: Family Medicine

## 2019-04-04 ENCOUNTER — Other Ambulatory Visit: Payer: Self-pay

## 2019-04-04 DIAGNOSIS — E162 Hypoglycemia, unspecified: Secondary | ICD-10-CM

## 2019-04-04 DIAGNOSIS — R5383 Other fatigue: Secondary | ICD-10-CM | POA: Diagnosis not present

## 2019-04-04 DIAGNOSIS — R0609 Other forms of dyspnea: Secondary | ICD-10-CM | POA: Diagnosis not present

## 2019-04-05 LAB — CBC
Hematocrit: 37.8 % (ref 34.0–46.6)
Hemoglobin: 12.6 g/dL (ref 11.1–15.9)
MCH: 28.8 pg (ref 26.6–33.0)
MCHC: 33.3 g/dL (ref 31.5–35.7)
MCV: 87 fL (ref 79–97)
Platelets: 257 10*3/uL (ref 150–450)
RBC: 4.37 x10E6/uL (ref 3.77–5.28)
RDW: 14.6 % (ref 11.7–15.4)
WBC: 6.2 10*3/uL (ref 3.4–10.8)

## 2019-04-05 LAB — BASIC METABOLIC PANEL
BUN/Creatinine Ratio: 20 (ref 12–28)
BUN: 17 mg/dL (ref 8–27)
CO2: 28 mmol/L (ref 20–29)
Calcium: 9.5 mg/dL (ref 8.7–10.3)
Chloride: 98 mmol/L (ref 96–106)
Creatinine, Ser: 0.85 mg/dL (ref 0.57–1.00)
GFR calc Af Amer: 78 mL/min/{1.73_m2} (ref >59)
GFR calc non Af Amer: 67 mL/min/{1.73_m2} (ref >59)
Glucose: 99 mg/dL (ref 65–99)
Potassium: 4.5 mmol/L (ref 3.5–5.2)
Sodium: 138 mmol/L (ref 134–144)

## 2019-04-05 LAB — D-DIMER, QUANTITATIVE: D-DIMER: 1.2 mg/L FEU — ABNORMAL HIGH (ref 0.00–0.49)

## 2019-04-08 ENCOUNTER — Other Ambulatory Visit: Payer: Self-pay | Admitting: Family Medicine

## 2019-04-08 DIAGNOSIS — R0602 Shortness of breath: Secondary | ICD-10-CM

## 2019-04-08 NOTE — Progress Notes (Signed)
Persistent dyspnea on exertion, elevated d-dimer noted.  CT scan ordered.

## 2019-04-09 ENCOUNTER — Telehealth: Payer: Self-pay | Admitting: Family Medicine

## 2019-04-09 ENCOUNTER — Institutional Professional Consult (permissible substitution): Payer: Medicare Other | Admitting: Pulmonary Disease

## 2019-04-09 NOTE — Telephone Encounter (Signed)
Called pt to inform her the office providing the ct scan was contacted at 9:30 AM and should be in contact with her soon to schedule.

## 2019-04-15 ENCOUNTER — Other Ambulatory Visit: Payer: Self-pay

## 2019-04-15 ENCOUNTER — Telehealth: Payer: Self-pay

## 2019-04-15 ENCOUNTER — Ambulatory Visit (INDEPENDENT_AMBULATORY_CARE_PROVIDER_SITE_OTHER): Payer: Medicare Other | Admitting: Psychiatry

## 2019-04-15 DIAGNOSIS — G4721 Circadian rhythm sleep disorder, delayed sleep phase type: Secondary | ICD-10-CM

## 2019-04-15 DIAGNOSIS — F411 Generalized anxiety disorder: Secondary | ICD-10-CM

## 2019-04-15 DIAGNOSIS — F331 Major depressive disorder, recurrent, moderate: Secondary | ICD-10-CM

## 2019-04-15 DIAGNOSIS — G3184 Mild cognitive impairment, so stated: Secondary | ICD-10-CM

## 2019-04-15 MED ORDER — DULOXETINE HCL 60 MG PO CPEP: ORAL_CAPSULE | ORAL | 1 refills | Status: DC

## 2019-04-15 MED ORDER — DULOXETINE HCL 30 MG PO CPEP: 30.0000 mg | ORAL_CAPSULE | Freq: Every evening | ORAL | 1 refills | Status: DC

## 2019-04-15 NOTE — Telephone Encounter (Signed)
Called pt and informed her that a CT scan has been scheduled for her at [facility] for 2:00 pm on 04/17/2019. Also informed her that she should eat or drinks anything beside water 4 hours before scan.

## 2019-04-15 NOTE — Progress Notes (Signed)
Psychotherapy Progress Note [facility] Luan Moore, PhD LP  Patient ID: [name]     MRN: [id number]     Therapy format: Individual psychotherapy Date: 04/15/2019     Start: 3:25p Stop: 4:25p Time Spent: 60 min Location: in-person   Session narrative (presenting needs, interim history, self-report of stressors and symptoms, applications of prior therapy, status changes, and interventions made in session) Aggravated by (former, but beloved) daughter-in-law Gildardo Pounds spouting about an alleged satanic conspiracy.  Son [name] has continued to be irritable and politically bitter with PT.  Phone app showed PT how [name] and his wife Henrine Screws travelling separately, led her to ask if there was a need in her family, [name] blasted her for allegedly jumping to conclusions.  Tried to explain, rebuffed.  Now a month without being in touch with [name].  Don Broach staying with her till Aug 10, had a delirious episode in which he broke vases and fetched a Electrical engineer, has hx of psychotic depression, turned out to be using some kind of sleeping pills that he got rid of.  Turned out to have been overdosing.  Suspects he has physically abused his dog, too.  More seriously considering relocating to [facility].  Figures she will downsize to fit a 950 sf apartment.  Trying to get through [name]'s things.  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  Some regret for selling out her authority, taking on a codependent man who progressively made her feel boxed in.  C/o fatigue and shortness of breath on exertion, starting before [name] passed.  Pulmonary consult and CT coming up.  Considering possibility she has a pulmonary embolus.  Had vascular bypass surgery, need attributed to smoking.  Wounds from surgery continue to hurt.  Hips hurting more.    Therapeutic modalities:  Solution-Oriented/Positive Psychology and Ego-Supportive  Mental Status/Observations:  Appearance:   Neat     Behavior:  Appropriate  Motor:  Normal  Speech/Language:   Clear and Coherent  Affect:  Appropriate  Mood:  euthymic  Thought process:  normal  Thought content:    WNL and except a few intrusive word errors   Sensory/Perceptual disturbances:    WNL  Orientation:  grossly intact  Attention:  Good  Concentration:  Fair  Memory:  grossly intact  Insight:    Good  Judgment:   Good  Impulse Control:  Good   Risk Assessment: Danger to Self: No Self-injurious Behavior: No Danger to Others: No Physical Aggression / Violence: No Duty to Warn: No Access to Firearms a concern: No  Assessment of progress:  progressing  Diagnosis:   ICD-10-CM   1. Generalized anxiety disorder  F41.1   2. Major depressive disorder, recurrent episode, moderate (HCC)  F33.1   3. Mild cognitive impairment  G31.84   4. 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 Continue with tasks of estate, disposing of [name]'s things.  Options given for charitable contributions for things not sold. . Try to move sleep phase earlier now that not trying to avoid painful interactions with husband . Reduce/quit smoking . Exercise/stretch as able . Other recommendations/advice as noted above . Continue to utilize previously learned skills ad lib . Maintain medication as prescribed and work faithfully with relevant prescriber(s) if any changes are desired or seem indicated . Call the clinic on-call service, present to  ER, or call 911 if any life-threatening psychiatric crisis Return in about 2 weeks (around 04/29/2019) for set up as in-person.  Blanchie Serve, PhD Luan Moore, PhD LP Clinical Psychologist, [facility] [address], [place], [place] [address] [phone]

## 2019-04-15 NOTE — Telephone Encounter (Signed)
Pharmacy updated to [place]

## 2019-04-17 ENCOUNTER — Ambulatory Visit (HOSPITAL_COMMUNITY): Admission: RE | Admit: 2019-04-17 | Payer: Medicare Other | Source: Ambulatory Visit

## 2019-04-22 ENCOUNTER — Other Ambulatory Visit: Payer: Self-pay

## 2019-04-22 DIAGNOSIS — I739 Peripheral vascular disease, unspecified: Secondary | ICD-10-CM

## 2019-04-25 ENCOUNTER — Telehealth: Payer: Self-pay | Admitting: Cardiovascular Disease

## 2019-04-25 NOTE — Telephone Encounter (Signed)
LVM for patient to call and schedule a followup with Dr. Gwenlyn Found for [place].

## 2019-04-28 ENCOUNTER — Ambulatory Visit (INDEPENDENT_AMBULATORY_CARE_PROVIDER_SITE_OTHER): Payer: Medicare Other | Admitting: Pulmonary Disease

## 2019-04-28 ENCOUNTER — Other Ambulatory Visit: Payer: Self-pay

## 2019-04-28 ENCOUNTER — Ambulatory Visit: Payer: Medicare Other | Admitting: Family

## 2019-04-28 ENCOUNTER — Encounter: Payer: Self-pay | Admitting: Pulmonary Disease

## 2019-04-28 VITALS — BP 110/82 | HR 71 | Ht 68.0 in | Wt 155.0 lb

## 2019-04-28 DIAGNOSIS — J84112 Idiopathic pulmonary fibrosis: Secondary | ICD-10-CM

## 2019-04-28 NOTE — Progress Notes (Signed)
[name]    [id number]    09-03-1946  Primary Care Physician:Greene, Ranell Patrick, MD  Referring Physician: [name], MD [address] [place],  [place] [address]  Chief complaint:   Shortness of breath, cough  HPI:  Patient with history of shortness of breath cough She recollects being told about concern for fibrosis about 4 months ago She had seen Dr. Melvyn Novas in 2017-documentation did indicate concern for pulmonary fibrosis at the time  Recently quit smoking about a month ago, at the worst was smoking a pack a day  Shortness of breath on exertion Used to be able to walk couple miles She gets short of breath with significant hip and joint pain  Complicated recovery from a vascular procedure  No recurrent skin rash No history of inflammatory bowel process No swallowing difficulty   Outpatient Encounter Medications as of 04/28/2019  Medication Sig  . amLODipine (NORVASC) 10 MG tablet Take 1 tablet (10 mg total) by mouth daily.  Marland Kitchen aspirin EC 81 MG tablet Take 81 mg by mouth daily.  . clonazePAM (KLONOPIN) 0.5 MG tablet Take 1 tab at bedtime and 1 tab PRN for anxiety.  Please do not take the daytime dose unless absolutely necessary  . DULoxetine (CYMBALTA) 30 MG capsule Take 1 capsule (30 mg total) by mouth every evening.  . DULoxetine (CYMBALTA) 60 MG capsule TAKE 1 CAPSULE BY MOUTH EVERY DAY  . famotidine (PEPCID) 20 MG tablet One at bedtime (Patient taking differently: Take 20 mg by mouth daily as needed for heartburn or indigestion (or nausea). )  . lamoTRIgine (LAMICTAL) 150 MG tablet TAKE 1 TABLET(150 MG) BY MOUTH TWICE DAILY  . linaclotide (LINZESS) 290 MCG CAPS capsule Take 1 capsule (290 mcg total) by mouth daily before breakfast.  . losartan (COZAAR) 50 MG tablet Take 1 tablet (50 mg total) by mouth daily.  . polyethylene glycol powder (GLYCOLAX/MIRALAX) powder Take 17 g by mouth every other day.  . Probiotic Product (PROBIOTIC-10 ULTIMATE PO) Take  by mouth.  Marland Kitchen rOPINIRole (REQUIP) 2 MG tablet Take 1.5 tablets (3 mg total) by mouth at bedtime.   No facility-administered encounter medications on file as of 04/28/2019.     Allergies as of 04/28/2019  . (No Known Allergies)    Past Medical History:  Diagnosis Date  . AAA (abdominal aortic aneurysm) (HCC)    3.1 cm 07/08/18, 3 year follow-up recommended; possible 3 cm AAA by aortogram 09/13/18  . Anemia    PMH  . Appendicitis with abscess    07/08/18, s/p perc drain; resolved 07/30/18 by CT  . Arthritis   . Bipolar disorder ([place])   . Cerebrovascular disease    intra and extracranial vascular dx per MRI 4/11, neurology rec strict CVRF control  . Colonic inertia   . Constipation    chronic;severe  . Coronary artery disease   . Depression   . Duodenitis   . EKG abnormalities    changes, stress test neg (false EKG changes)  . Gastritis   . GERD (gastroesophageal reflux disease)   . Hypertension   . Hypothyroid 01/16/2014  . Meningioma ([place]) 10/21/2013  . PAD (peripheral artery disease) ([place])   . Psoriasis    sees derm  . Stroke ([place])   . Wears dentures   . Wears glasses     Past Surgical History:  Procedure Laterality Date  . ABDOMINAL AORTOGRAM W/LOWER EXTREMITY N/A 09/13/2018   Procedure: ABDOMINAL AORTOGRAM W/LOWER  EXTREMITY;  Surgeon: Elam Dutch, MD;  Location: [facility];  Service: Cardiovascular;  Laterality: N/A;  . arthroscopy  04/2010   Right knee  . CATARACT EXTRACTION W/ INTRAOCULAR LENS  IMPLANT, BILATERAL    . COLONOSCOPY    . FEMORAL-POPLITEAL BYPASS GRAFT  10/22/2018  . FEMORAL-POPLITEAL BYPASS GRAFT Left 10/22/2018   Procedure: LEFT FEMORAL TO BELOW THE KNEE POPLITEAL ARTERY BYPASS GRAFT;  Surgeon: Elam Dutch, MD;  Location: [facility];  Service: Vascular;  Laterality: Left;  . I&D EXTREMITY Left 11/08/2018   Procedure: IRRIGATION AND DEBRIDEMENT EXTREMITY Left Leg;  Surgeon: Elam Dutch, MD;  Location: [facility];  Service: Vascular;   Laterality: Left;  . IR RADIOLOGIST EVAL & MGMT  07/30/2018  . MULTIPLE TOOTH EXTRACTIONS    . TUBAL LIGATION      Family History  Problem Relation Age of Onset  . Breast cancer Mother        metastisis to bones  . Diabetes Son   . Heart disease Other        grandfather   . Alcohol abuse Brother   . Heart disease Brother   . Heart disease Maternal Aunt   . Lung cancer Brother        smoked  . Colon cancer Neg Hx     Social History   Socioeconomic History  . Marital status: Widowed    Spouse name: Not on file  . Number of children: 1  . Years of education: Not on file  . Highest education level: Not on file  Occupational History  . Occupation: retired  Scientific laboratory technician  . Financial resource strain: Not hard at all  . Food insecurity    Worry: Patient refused    Inability: Patient refused  . Transportation needs    Medical: Patient refused    Non-medical: Patient refused  Tobacco Use  . Smoking status: Former Smoker    Packs/day: 1.00    Years: 60.00    Pack years: 60.00    Types: Cigarettes    Quit date: 11/26/2018    Years since quitting: 0.4  . Smokeless tobacco: Never Used  Substance and Sexual Activity  . Alcohol use: Yes    Alcohol/week: 0.0 standard drinks    Comment: yes on occassion  . Drug use: No  . Sexual activity: Yes  Lifestyle  . Physical activity    Days per week: Patient refused    Minutes per session: Patient refused  . Stress: Not on file  Relationships  . Social Herbalist on phone: Patient refused    Gets together: Patient refused    Attends religious service: Patient refused    Active member of club or organization: Patient refused    Attends meetings of clubs or organizations: Patient refused    Relationship status: Patient refused  . Intimate partner violence    Fear of current or ex partner: Patient refused    Emotionally abused: Patient refused    Physically abused: Patient refused    Forced sexual activity: Patient  refused  Other Topics Concern  . Not on file  Social History Narrative   Brother in law Mr Rexford Maus (one of my patients)   Lives w/ husband        Review of Systems  Constitutional: Negative.   HENT: Negative.   Respiratory: Positive for cough and shortness of breath.   Musculoskeletal: Positive for arthralgias.    Vitals:   04/28/19 1542  BP: 110/82  Pulse: 71  SpO2: 95%     Physical Exam  Constitutional: She appears well-developed and well-nourished.  HENT:  Head: Normocephalic and atraumatic.  Eyes: Pupils are equal, round, and reactive to light. Right eye exhibits no discharge.  Neck: Normal range of motion. Neck supple. No tracheal deviation present. No thyromegaly present.  Cardiovascular: Normal rate, regular rhythm and normal heart sounds.  Pulmonary/Chest: Effort normal. No respiratory distress. She has no wheezes. She has rales. She exhibits no tenderness.  Abdominal: Soft. Bowel sounds are normal.     Data Reviewed: CT scan of the chest from 2017 reviewed showing a right upper lobe nodule, 5 mm, evidence of emphysema, evidence of prominent interstitial markings Abdominal CT recently in 2020 did reveal progressive findings of fibrosis at the bases  Assessment:  Cough  Shortness of breath on exertion  Obstructive lung disease -Emphysema on previous CT -No previous PFT on record Pulmonary fibrosis -Progressive findings on CT scan of the chest  Abnormal CT scan of the chest showing a 5 mm nodule right upper lobe -Significant smoking history  Plan/Recommendations: Obtain high-resolution CT scan of the chest Obtain pulmonary function study  Obtain markers of inflammation, ESR, ANA, scleroderma, Sjogren, rheumatoid factor, anticentromere  I will see patient back in the office in about 4 to 6 weeks  Encouraged to call with any significant concerns  May require inhalers for shortness of breath but will wait until after the PFT to assess degree of  obstructive disease  Sherrilyn Rist MD [facility] 04/28/2019, 4:13 PM  CC: [name], MD

## 2019-04-28 NOTE — Patient Instructions (Signed)
Cough Shortness of breath  Obstructive lung disease-emphysema on CT  Concern for pulmonary fibrosis-progressive process on previous CTs  Will obtain high-resolution CT Obtain breathing study Obtain blood work for chronic inflammation, vasculitis  We will see you back in the office in 4 to 6 weeks

## 2019-04-29 ENCOUNTER — Ambulatory Visit (INDEPENDENT_AMBULATORY_CARE_PROVIDER_SITE_OTHER): Payer: Medicare Other | Admitting: Psychiatry

## 2019-04-29 ENCOUNTER — Other Ambulatory Visit: Payer: Self-pay

## 2019-04-29 DIAGNOSIS — G4721 Circadian rhythm sleep disorder, delayed sleep phase type: Secondary | ICD-10-CM

## 2019-04-29 DIAGNOSIS — F411 Generalized anxiety disorder: Secondary | ICD-10-CM

## 2019-04-29 DIAGNOSIS — F331 Major depressive disorder, recurrent, moderate: Secondary | ICD-10-CM

## 2019-04-29 DIAGNOSIS — Z634 Disappearance and death of family member: Secondary | ICD-10-CM

## 2019-04-29 DIAGNOSIS — R69 Illness, unspecified: Secondary | ICD-10-CM | POA: Diagnosis not present

## 2019-04-29 DIAGNOSIS — F17219 Nicotine dependence, cigarettes, with unspecified nicotine-induced disorders: Secondary | ICD-10-CM

## 2019-04-29 LAB — C-REACTIVE PROTEIN: CRP: <1 mg/dL (ref 0.5–20.0)

## 2019-04-29 LAB — SEDIMENTATION RATE: Sed Rate: 27 mm/hr (ref 0–30)

## 2019-04-29 NOTE — Progress Notes (Signed)
Psychotherapy Progress Note [facility] Luan Moore, PhD LP  Patient ID: AHSHA [name]     MRN: [id number]     Therapy format: Individual psychotherapy Date: 04/29/2019     Start: 3:20p Stop: 4:12p Time Spent: 52 min Location: telehealth   Telehealth visit -- I connected with this patient by an approved telecommunication method (audio only), with her informed consent, and verifying identity and patient privacy.  I was located at my office and patient at her home.  As needed, we discussed the limitations, risks, and security and privacy concerns associated with telehealth service, including the availability and conditions which currently govern in-person appointments and the possibility that 3rd-party payment may not be fully guaranteed and she may be responsible for charges.  After she indicated understanding, we proceeded with the session.  Also discussed treatment planning, as needed, including ongoing verbal agreement with the plan, the opportunity to ask and answer all questions, her demonstrated understanding of instructions, and her readiness to call the office should symptoms worsen or she feels she is in a crisis state and needs more immediate and tangible assistance.  Session narrative (presenting needs, interim history, self-report of stressors and symptoms, applications of prior therapy, status changes, and interventions made in session) Motivated, sometimes alarmed, by [employer].  Seen recently for COPD/emphysema/fibrosis at [facility].  Coughing is worse, stamina walking is down, and arthritis flaring as well.  Attributes downturn to vascular bypass surgery in February, even though she was 2 weeks off cigarettes then.  Inflammatory condition and widespread pain ever since.  Provided inspirational stories for quitting smoking.  Hx vaping for a time, made cough worse.  Encouraged to do anything else for 15 minutes before deciding whether to light up.  Dog sick as  well, refusing pills.  Frustrated, finds herself trying to force him, and he is clenching teeth to resist.  Offered tips for managing the behavior and encouraging compliance.  Meds and metabolism -- off Risperdal, still taking Klonopin and Requip about 1.5 hrs ahead of bed, sometimes gets groggy before going up the steps for bed.  Tends to binge ice cream, but not lately (not buying).  May have 1-3 donuts instead of supper, finds herself gaining weight.  Walking some more, for legs and weight.  Did get B12, neglected to get vit D.  Recommend consider omega-3 supplement for anti-inflammation.  Therapeutic modalities: Cognitive Behavioral Therapy and Solution-Oriented/Positive Psychology  Mental Status/Observations:  Appearance:   Not assessed     Behavior:  Appropriate  Motor:  Not assessed  Speech/Language:   Clear and Coherent  Affect:  Not assessed  Mood:  irritable  Thought process:  normal  Thought content:    WNL  Sensory/Perceptual disturbances:    WNL  Orientation:  grossly intact  Attention:  Good  Concentration:  Good  Memory:  WNL  Insight:    Good  Judgment:   Good  Impulse Control:  Fair   Risk Assessment: Danger to Self: No Self-injurious Behavior: No Danger to Others: No Physical Aggression / Violence: No Duty to Warn: No Access to Firearms a concern: No  Assessment of progress:  stabilized  Diagnosis:   ICD-10-CM   1. Generalized anxiety disorder  F41.1   2. Major depressive disorder, recurrent episode, moderate (HCC)  F33.1   3. 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 Bereavement  Z63.4   5. Nicotine dependence, cigarettes, with unspecified nicotine-induced disorders  F17.219   6. Suspected condition  R69    inflammatory process   Plan:  . Continue B12 or B-complex for neuro health . Vitamin D and omega-3 supplement  . Other recommendations/advice as noted above . Continue to utilize previously learned skills ad lib . Maintain medication as prescribed  and work faithfully with relevant prescriber(s) if any changes are desired or seem indicated . Call the clinic on-call service, present to ER, or call 911 if any life-threatening psychiatric crisis Return for time as available.  Blanchie Serve, PhD Luan Moore, PhD LP Clinical Psychologist, [facility] [address] [place], [place] [address] [phone]

## 2019-04-30 LAB — SJOGRENS SYNDROME-A EXTRACTABLE NUCLEAR ANTIBODY: SSA (Ro) (ENA) Antibody, IgG: <1 AI

## 2019-04-30 LAB — ANA: Anti Nuclear Antibody (ANA): POSITIVE — AB

## 2019-04-30 LAB — ANTI-JO 1 ANTIBODY, IGG: Anti JO-1: <0.2 AI (ref 0.0–0.9)

## 2019-04-30 LAB — ANTI-NUCLEAR AB-TITER (ANA TITER): ANA Titer 1: 1:80 {titer} — ABNORMAL HIGH

## 2019-04-30 LAB — SJOGRENS SYNDROME-B EXTRACTABLE NUCLEAR ANTIBODY: SSB (La) (ENA) Antibody, IgG: <1 AI

## 2019-04-30 LAB — ANTI-SCLERODERMA ANTIBODY: Scleroderma (Scl-70) (ENA) Antibody, IgG: <1 AI

## 2019-04-30 LAB — RHEUMATOID FACTOR: Rheumatoid fact SerPl-aCnc: <14 [IU]/mL

## 2019-05-01 ENCOUNTER — Encounter (HOSPITAL_COMMUNITY): Payer: Medicare Other

## 2019-05-01 ENCOUNTER — Other Ambulatory Visit (HOSPITAL_COMMUNITY): Payer: Medicare Other

## 2019-05-01 ENCOUNTER — Ambulatory Visit: Payer: Medicare Other | Admitting: Family

## 2019-05-01 ENCOUNTER — Ambulatory Visit (HOSPITAL_COMMUNITY): Payer: Medicare Other

## 2019-05-05 ENCOUNTER — Other Ambulatory Visit: Payer: Self-pay | Admitting: Psychiatry

## 2019-05-05 ENCOUNTER — Telehealth (HOSPITAL_COMMUNITY): Payer: Self-pay | Admitting: Rehabilitation

## 2019-05-05 NOTE — Telephone Encounter (Signed)

## 2019-05-06 ENCOUNTER — Other Ambulatory Visit: Payer: Self-pay

## 2019-05-06 ENCOUNTER — Ambulatory Visit (HOSPITAL_COMMUNITY)
Admission: RE | Admit: 2019-05-06 | Discharge: 2019-05-06 | Disposition: A | Discharge status: Home / Self Care | Payer: Medicare Other | Source: Ambulatory Visit | Attending: Family | Admitting: Family

## 2019-05-06 ENCOUNTER — Ambulatory Visit (INDEPENDENT_AMBULATORY_CARE_PROVIDER_SITE_OTHER)
Admission: RE | Admit: 2019-05-06 | Discharge: 2019-05-06 | Disposition: A | Discharge status: Home / Self Care | Payer: Medicare Other | Source: Ambulatory Visit | Attending: Family | Admitting: Family

## 2019-05-06 DIAGNOSIS — I739 Peripheral vascular disease, unspecified: Secondary | ICD-10-CM

## 2019-05-07 ENCOUNTER — Encounter: Payer: Self-pay | Admitting: Family Medicine

## 2019-05-07 ENCOUNTER — Telehealth: Payer: Self-pay | Admitting: *Deleted

## 2019-05-07 ENCOUNTER — Inpatient Hospital Stay: Admission: RE | Admit: 2019-05-07 | Payer: Medicare Other | Source: Ambulatory Visit

## 2019-05-07 NOTE — Telephone Encounter (Signed)
Virtual Visit Pre-Appointment Phone Call  Today, I spoke with [name] and performed the following actions:  1. I explained that we are currently trying to limit exposure to the COVID-19 virus by seeing patients at home rather than in the office.  I explained that the visits are best done by video, but can be done by telephone.  I asked the patient if a virtual visit that the patient would like to try instead of coming into the office. [name] agreed to proceed with the virtual visit scheduled with [name] on 05/09/19.     2. I confirmed the BEST phone number to call the day of the visit and- I included this in appointment notes.  3. I asked if the patient had access to (through a family member/friend) a smartphone with video capability to be used for her visit?"  The patient said yes -   4. I confirmed consent by  a. sending through [place] or by email the [place] as written at the end of this message or  b. verbally as listed below. i. This visit is being performed in the setting of COVID-19. ii. All virtual visits are billed to your insurance company just like a normal visit would be.   iii. We'd like you to understand that the technology does not allow for your provider to perform an examination, and thus may limit your provider's ability to fully assess your condition.  iv. If your provider identifies any concerns that need to be evaluated in person, we will make arrangements to do so.   v. Finally, though the technology is pretty good, we cannot assure that it will always work on either your or our end, and in the setting of a video visit, we may have to convert it to a phone-only visit.  In either situation, we cannot ensure that we have a secure connection.   vi. Are you willing to proceed?"  STAFF: Did the patient verbally acknowledge consent to telehealth visit? Document YES/NO here: YES  2. I advised the patient to be  prepared - I asked that the patient, on the day of her visit, record any information possible with the equipment at her home, such as blood pressure, pulse, oxygen saturation, and your weight and write them all down. I asked the patient to have a pen and paper handy nearby the day of the visit as well.  3. If the patient was scheduled for a video visit, I informed the patient that the visit with the doctor would start with a text to the smartphone # given to [place] by the patient.         If the patient was scheduled for a telephone call, I informed the patient that the visit with the doctor would start with a call to the telephone # given to [place] by the patient.  4. I Informed patient they will receive a phone call 15 minutes prior to their appointment time from a [place] or nurse to review medications, allergies, etc. to prepare for the visit.    TELEPHONE CALL NOTE  [name] has been deemed a candidate for a follow-up tele-health visit to limit community exposure during the Covid-19 pandemic. I spoke with the patient via phone to ensure availability of phone/video source, confirm preferred email & phone number, and discuss instructions and expectations.  I reminded [name] to be prepared with any vital sign and/or heart  rhythm information that could potentially be obtained via home monitoring, at the time of her visit. I reminded [name] to expect a phone call prior to her visit.  Cleaster Corin, NT 05/07/2019 4:40 PM     FULL LENGTH CONSENT FOR TELE-HEALTH VISIT   I hereby voluntarily request, consent and authorize [facility] and its employed or contracted physicians, physician assistants, nurse practitioners or other licensed health care professionals (the Practitioner), to provide me with telemedicine health care services (the "Services") as deemed necessary by the treating Practitioner. I acknowledge and consent to receive the Services by the Practitioner via telemedicine.  I understand that the telemedicine visit will involve communicating with the Practitioner through live audiovisual communication technology and the disclosure of certain medical information by electronic transmission. I acknowledge that I have been given the opportunity to request an in-person assessment or other available alternative prior to the telemedicine visit and am voluntarily participating in the telemedicine visit.  I understand that I have the right to withhold or withdraw my consent to the use of telemedicine in the course of my care at any time, without affecting my right to future care or treatment, and that the Practitioner or I may terminate the telemedicine visit at any time. I understand that I have the right to inspect all information obtained and/or recorded in the course of the telemedicine visit and may receive copies of available information for a reasonable fee.  I understand that some of the potential risks of receiving the Services via telemedicine include:  Marland Kitchen Delay or interruption in medical evaluation due to technological equipment failure or disruption; . Information transmitted may not be sufficient (e.g. poor resolution of images) to allow for appropriate medical decision making by the Practitioner; and/or  . In rare instances, security protocols could fail, causing a breach of personal health information.  Furthermore, I acknowledge that it is my responsibility to provide information about my medical history, conditions and care that is complete and accurate to the best of my ability. I acknowledge that Practitioner's advice, recommendations, and/or decision may be based on factors not within their control, such as incomplete or inaccurate data provided by me or distortions of diagnostic images or specimens that may result from electronic transmissions. I understand that the practice of medicine is not an exact science and that Practitioner makes no warranties or guarantees  regarding treatment outcomes. I acknowledge that I will receive a copy of this consent concurrently upon execution via email to the email address I last provided but may also request a printed copy by calling the office of [facility].    I understand that my insurance will be billed for this visit.   I have read or had this consent read to me. . I understand the contents of this consent, which adequately explains the benefits and risks of the Services being provided via telemedicine.  . I have been provided ample opportunity to ask questions regarding this consent and the Services and have had my questions answered to my satisfaction. . I give my informed consent for the services to be provided through the use of telemedicine in my medical care  By participating in this telemedicine visit I agree to the above.

## 2019-05-09 ENCOUNTER — Other Ambulatory Visit: Payer: Self-pay

## 2019-05-09 ENCOUNTER — Encounter: Payer: Self-pay | Admitting: Family

## 2019-05-09 ENCOUNTER — Ambulatory Visit (INDEPENDENT_AMBULATORY_CARE_PROVIDER_SITE_OTHER): Payer: Medicare Other | Admitting: Family

## 2019-05-09 VITALS — Ht 68.0 in | Wt 158.0 lb

## 2019-05-09 DIAGNOSIS — Z87891 Personal history of nicotine dependence: Secondary | ICD-10-CM | POA: Diagnosis not present

## 2019-05-09 DIAGNOSIS — I779 Disorder of arteries and arterioles, unspecified: Secondary | ICD-10-CM

## 2019-05-09 NOTE — Progress Notes (Signed)
Virtual Visit via Telephone Note   I connected with [name] on 05/09/2019 using the [employer] by telephone and verified that I was speaking with the correct person using two identifiers. Patient was located at her home and accompanied by her grandchildren. I am located at the [facility] office/clinic.   The limitations of evaluation and management by telemedicine and the availability of in person appointments have been previously discussed with the patient and are documented in the patients chart. The patient expressed understanding and consented to proceed.  PCP: [name], MD  Chief Complaint: Follow up peripheral artery occlusive disease   History of Present Illness: [name] is a 73 y.o. female who is s/p left superficial femoral artery to below-knee popliteal artery bypass using non-reversed left greater saphenous vein on 10-22-18 by Dr. Oneida Alar for rest pain in left foot, which then required I&D of left thigh on 11-08-18 by Dr. Oneida Alar for left thigh wound infection.   She states that the incisions on her left leg have healed completely and the swelling in her left leg has improved a great deal.   She has been walking her dog daily, carries a portable fold up chair, stops several times.   Her husband passed away in Jan 31, 2019 and she is thinking about moving to [place] where her children live. She may move in December 2020.    Diabetic: No Tobacco use: former smoker, quit July 2020, started in her teens  Pt meds include: Statin :no, a statin caused nausea Betablocker: No ASA: Yes Other anticoagulants/antiplatelets: no   Past Medical History:  Diagnosis Date  . AAA (abdominal aortic aneurysm) (HCC)    3.1 cm 07/08/18, 3 year follow-up recommended; possible 3 cm AAA by aortogram 09/13/18  . Anemia    PMH  . Appendicitis with abscess    07/08/18, s/p perc drain; resolved 07/30/18 by CT  . Arthritis   . Bipolar disorder ([place])   . Cerebrovascular disease    intra and extracranial vascular dx per MRI 4/11, neurology rec strict CVRF control  . Colonic inertia   . Constipation    chronic;severe  . Coronary artery disease   . Depression   . Duodenitis   . EKG abnormalities    changes, stress test neg (false EKG changes)  . Gastritis   . GERD (gastroesophageal reflux disease)   . Hypertension   . Hypothyroid 01/16/2014  . Meningioma ([place]) 10/21/2013  . PAD (peripheral artery disease) ([place])   . Psoriasis    sees derm  . Stroke ([place])   . Wears dentures   . Wears glasses     Past Surgical History:  Procedure Laterality Date  . ABDOMINAL AORTOGRAM W/LOWER EXTREMITY N/A 09/13/2018   Procedure: ABDOMINAL AORTOGRAM W/LOWER EXTREMITY;  Surgeon: Elam Dutch, MD;  Location: [facility];  Service: Cardiovascular;  Laterality: N/A;  . arthroscopy  04/2010   Right knee  . CATARACT EXTRACTION W/ INTRAOCULAR LENS  IMPLANT, BILATERAL    . COLONOSCOPY    . FEMORAL-POPLITEAL BYPASS GRAFT  10/22/2018  . FEMORAL-POPLITEAL BYPASS GRAFT Left 10/22/2018   Procedure: LEFT FEMORAL TO BELOW THE KNEE POPLITEAL ARTERY BYPASS GRAFT;  Surgeon: Elam Dutch, MD;  Location: [facility];  Service: Vascular;  Laterality: Left;  . I&D EXTREMITY Left 11/08/2018   Procedure: IRRIGATION AND DEBRIDEMENT EXTREMITY Left Leg;  Surgeon: Elam Dutch, MD;  Location: [facility];  Service: Vascular;  Laterality: Left;  . IR RADIOLOGIST EVAL &  MGMT  07/30/2018  . MULTIPLE TOOTH EXTRACTIONS    . TUBAL LIGATION      Social History   Socioeconomic History  . Marital status: Widowed    Spouse name: Not on file  . Number of children: 1  . Years of education: Not on file  . Highest education level: Not on file  Occupational History  . Occupation: retired  Scientific laboratory technician  . Financial resource strain: Not hard at all  . Food insecurity    Worry: Patient refused    Inability: Patient refused  . Transportation needs    Medical: Patient refused    Non-medical: Patient  refused  Tobacco Use  . Smoking status: Former Smoker    Packs/day: 1.00    Years: 60.00    Pack years: 60.00    Types: Cigarettes    Quit date: 11/26/2018    Years since quitting: 0.4  . Smokeless tobacco: Never Used  Substance and Sexual Activity  . Alcohol use: Yes    Alcohol/week: 0.0 standard drinks    Comment: yes on occassion  . Drug use: No  . Sexual activity: Yes  Lifestyle  . Physical activity    Days per week: Patient refused    Minutes per session: Patient refused  . Stress: Not on file  Relationships  . Social Herbalist on phone: Patient refused    Gets together: Patient refused    Attends religious service: Patient refused    Active member of club or organization: Patient refused    Attends meetings of clubs or organizations: Patient refused    Relationship status: Patient refused  . Intimate partner violence    Fear of current or ex partner: Patient refused    Emotionally abused: Patient refused    Physically abused: Patient refused    Forced sexual activity: Patient refused  Other Topics Concern  . Not on file  Social History Narrative   Brother in law Mr Rexford Maus (one of my patients)   Lives w/ husband       No Known Allergies   Current Outpatient Medications on File Prior to Visit  Medication Sig Dispense Refill  . amLODipine (NORVASC) 10 MG tablet Take 1 tablet (10 mg total) by mouth daily. 90 tablet 1  . aspirin EC 81 MG tablet Take 81 mg by mouth daily.    . clonazePAM (KLONOPIN) 0.5 MG tablet Take 1 tab at bedtime and 1 tab PRN for anxiety.  Please do not take the daytime dose unless absolutely necessary 40 tablet 1  . DULoxetine (CYMBALTA) 30 MG capsule Take 1 capsule (30 mg total) by mouth every evening. 90 capsule 1  . DULoxetine (CYMBALTA) 60 MG capsule TAKE 1 CAPSULE BY MOUTH EVERY DAY 90 capsule 1  . famotidine (PEPCID) 20 MG tablet One at bedtime (Patient taking differently: Take 20 mg by mouth daily as needed for  heartburn or indigestion (or nausea). ) 30 tablet 11  . lamoTRIgine (LAMICTAL) 150 MG tablet TAKE 1 TABLET(150 MG) BY MOUTH TWICE DAILY 180 tablet 0  . linaclotide (LINZESS) 290 MCG CAPS capsule Take 1 capsule (290 mcg total) by mouth daily before breakfast. 30 capsule 3  . losartan (COZAAR) 50 MG tablet Take 1 tablet (50 mg total) by mouth daily. 90 tablet 1  . polyethylene glycol powder (GLYCOLAX/MIRALAX) powder Take 17 g by mouth every other day.    . Probiotic Product (PROBIOTIC-10 ULTIMATE PO) Take by mouth.    Marland Kitchen rOPINIRole (REQUIP)  2 MG tablet TAKE 1 AND 1/2 TABLETS(3 MG) BY MOUTH AT BEDTIME 135 tablet 0   No current facility-administered medications on file prior to visit.     12 system ROS was negative unless otherwise noted in HPI    Observations/Objective:  DATA  Left LE Arterial Duplex (05-06-19): Left Graft #1: +--------------------+--------+--------+---------+--------+                     PSV cm/sStenosisWaveform Comments +--------------------+--------+--------+---------+--------+ Inflow              68              triphasic         +--------------------+--------+--------+---------+--------+ Proximal Anastomosis42              triphasic         +--------------------+--------+--------+---------+--------+ Proximal Graft      48              triphasic         +--------------------+--------+--------+---------+--------+ Mid Graft           59              triphasic         +--------------------+--------+--------+---------+--------+ Distal Graft        123             triphasic         +--------------------+--------+--------+---------+--------+ Distal Anastomosis  47              biphasic          +--------------------+--------+--------+---------+--------+ Outflow             90              biphasic          +--------------------+--------+--------+---------+--------+ Summary: Left: Patent left superficial femoral artery to  below-knee popliteal artery bypass graft without evidence of stenosis.   ABI Findings (05-06-19): +---------+------------------+-----+---------+--------+ Right    Rt Pressure (mmHg)IndexWaveform Comment  +---------+------------------+-----+---------+--------+ Brachial 164                    triphasic         +---------+------------------+-----+---------+--------+ PTA      164               1.00 triphasic         +---------+------------------+-----+---------+--------+ DP       140               0.85 biphasic          +---------+------------------+-----+---------+--------+ Great Toe85                0.52 Abnormal          +---------+------------------+-----+---------+--------+  +---------+------------------+-----+----------+-------+ Left     Lt Pressure (mmHg)IndexWaveform  Comment +---------+------------------+-----+----------+-------+ Brachial 163                    triphasic         +---------+------------------+-----+----------+-------+ PTA      158               0.96 triphasic         +---------+------------------+-----+----------+-------+ DP       96                0.59 monophasic        +---------+------------------+-----+----------+-------+ Great Toe94                0.57 Abnormal          +---------+------------------+-----+----------+-------+  +-------+-----------+-----------+------------+------------+  ABI/TBIToday's ABIToday's TBIPrevious ABIPrevious TBI +-------+-----------+-----------+------------+------------+ Right  1.00       0.52       0.93        N/A          +-------+-----------+-----------+------------+------------+ Left   0.96       0.57       0.66        N/A          +-------+-----------+-----------+------------+------------+ Bilateral ABIs appear increased compared to prior study on 10/23/2018. Summary: Right: Resting right ankle-brachial index is within normal range. No evidence of  significant right lower extremity arterial disease. The right toe-brachial index is abnormal. RT great toe pressure = 85 mmHg. Left: Resting left ankle-brachial index is within normal range. No evidence of significant left lower extremity arterial disease. The left toe-brachial index is abnormal. LT Great toe pressure = 94 mmHg.    Assessment and Plan: [name] is a 73 y.o. female who is s/p left superficial femoral artery to below-knee popliteal artery bypass using non-reversed left greater saphenous vein on 10-22-18 by Dr. Oneida Alar for rest pain in left foot, which then required I&D of left thigh on 11-08-18 by Dr. Oneida Alar for left thigh wound infection.   She states that the incisions on her left leg have healed and the swelling in her left leg has improved a great deal.   She has been walking her dog daily, carries a portable fold up chair, stops several times.    Left leg arterial duplex demonstrates no stenosis, tri and biphasic waveforms. ABI's demonstrate improved arterial perfusion in both lower extremities; from mild disease to normal in the right with tri and biphasic waveforms, from moderate disease to normal in the left with tri and monophasic waveforms.    Follow Up Instructions:  Follow up in 3 months with left LE arterial duplex and ABI's.  I advised her to notify us if she develops concerns re the circulation in her feet or legs.   Continue daily walking in a safe environment.    I discussed the assessment and treatment plan with the patient. The patient was provided an opportunity to ask questions and all were answered. The patient agreed with the plan and demonstrated an understanding of the instructions.   The patient was advised to call back or seek an in-person evaluation if the symptoms worsen or if the condition fails to improve as anticipated.  I spent 12 minutes with the patient via telephone encounter.   Gabrielle Dare [facility] [place] Office: [phone]  05/09/2019, 8:28 AM

## 2019-05-09 NOTE — Patient Instructions (Signed)
Peripheral Vascular Disease  Peripheral vascular disease (PVD) is a disease of the blood vessels that are not part of your heart and brain. A simple term for PVD is poor circulation. In most cases, PVD narrows the blood vessels that carry blood from your heart to the rest of your body. This can reduce the supply of blood to your arms, legs, and internal organs, like your stomach or kidneys. However, PVD most often affects a person's lower legs and feet. Without treatment, PVD tends to get worse. PVD can also lead to acute ischemic limb. This is when an arm or leg suddenly cannot get enough blood. This is a medical emergency. Follow these instructions at home: Lifestyle  Do not use any products that contain nicotine or tobacco, such as cigarettes and e-cigarettes. If you need help quitting, ask your doctor.  Lose weight if you are overweight. Or, stay at a healthy weight as told by your doctor.  Eat a diet that is low in fat and cholesterol. If you need help, ask your doctor.  Exercise regularly. Ask your doctor for activities that are right for you. General instructions  Take over-the-counter and prescription medicines only as told by your doctor.  Take good care of your feet: ? Wear comfortable shoes that fit well. ? Check your feet often for any cuts or sores.  Keep all follow-up visits as told by your doctor This is important. Contact a doctor if:  You have cramps in your legs when you walk.  You have leg pain when you are at rest.  You have coldness in a leg or foot.  Your skin changes.  You are unable to get or have an erection (erectile dysfunction).  You have cuts or sores on your feet that do not heal. Get help right away if:  Your arm or leg turns cold, numb, and blue.  Your arms or legs become red, warm, swollen, painful, or numb.  You have chest pain.  You have trouble breathing.  You suddenly have weakness in your face, arm, or leg.  You become very  confused or you cannot speak.  You suddenly have a very bad headache.  You suddenly cannot see. Summary  Peripheral vascular disease (PVD) is a disease of the blood vessels.  A simple term for PVD is poor circulation. Without treatment, PVD tends to get worse.  Treatment may include exercise, low fat and low cholesterol diet, and quitting smoking. This information is not intended to replace advice given to you by your health care provider. Make sure you discuss any questions you have with your health care provider. Document Released: 11/22/2009 Document Revised: 08/10/2017 Document Reviewed: 10/05/2016 Elsevier Patient Education  2020 Elsevier Inc.  

## 2019-05-13 ENCOUNTER — Telehealth: Payer: Self-pay

## 2019-05-13 NOTE — Telephone Encounter (Signed)
Received refill request for Linzess 141mcg but office note from 03-2019 with Tye Savoy, NP  indicated an increase to 227mcg.  LM for pt to call back to discuss which dose is managing her Sx.

## 2019-05-14 ENCOUNTER — Telehealth: Payer: Self-pay | Admitting: Pulmonary Disease

## 2019-05-14 DIAGNOSIS — J84112 Idiopathic pulmonary fibrosis: Secondary | ICD-10-CM

## 2019-05-14 MED ORDER — LINACLOTIDE 145 MCG PO CAPS: 145.0000 ug | ORAL_CAPSULE | Freq: Every day | ORAL | 5 refills | Status: DC

## 2019-05-14 NOTE — Telephone Encounter (Signed)
Pt wanted CT scheduled at [facility] imaging Order has been changed.

## 2019-05-14 NOTE — Telephone Encounter (Signed)
Returned call to patient who could not find [facility] She missed appt and wants done at [facility].

## 2019-05-14 NOTE — Telephone Encounter (Signed)
Pt states she is take 145 dosage, also wanted to let you know she is out and needs med

## 2019-05-14 NOTE — Telephone Encounter (Signed)
Script sent to pharmacy.

## 2019-05-21 ENCOUNTER — Ambulatory Visit (HOSPITAL_COMMUNITY): Payer: Medicare Other

## 2019-05-21 ENCOUNTER — Encounter (HOSPITAL_COMMUNITY): Payer: Medicare Other

## 2019-05-21 ENCOUNTER — Ambulatory Visit: Payer: Medicare Other | Admitting: Family

## 2019-05-22 ENCOUNTER — Other Ambulatory Visit: Payer: Medicare Other

## 2019-05-22 ENCOUNTER — Ambulatory Visit
Admission: RE | Admit: 2019-05-22 | Discharge: 2019-05-22 | Disposition: A | Discharge status: Home / Self Care | Payer: Medicare Other | Source: Ambulatory Visit | Attending: Pulmonary Disease | Admitting: Pulmonary Disease

## 2019-05-22 DIAGNOSIS — J84112 Idiopathic pulmonary fibrosis: Secondary | ICD-10-CM

## 2019-05-23 ENCOUNTER — Other Ambulatory Visit: Payer: Self-pay | Admitting: Psychiatry

## 2019-05-23 ENCOUNTER — Ambulatory Visit (INDEPENDENT_AMBULATORY_CARE_PROVIDER_SITE_OTHER): Payer: Medicare Other | Admitting: Psychiatry

## 2019-05-23 DIAGNOSIS — Z8673 Personal history of transient ischemic attack (TIA), and cerebral infarction without residual deficits: Secondary | ICD-10-CM

## 2019-05-23 DIAGNOSIS — G3184 Mild cognitive impairment, so stated: Secondary | ICD-10-CM

## 2019-05-23 DIAGNOSIS — G8929 Other chronic pain: Secondary | ICD-10-CM | POA: Diagnosis not present

## 2019-05-23 DIAGNOSIS — I70229 Atherosclerosis of native arteries of extremities with rest pain, unspecified extremity: Secondary | ICD-10-CM

## 2019-05-23 DIAGNOSIS — F411 Generalized anxiety disorder: Secondary | ICD-10-CM

## 2019-05-23 DIAGNOSIS — Z634 Disappearance and death of family member: Secondary | ICD-10-CM | POA: Diagnosis not present

## 2019-05-23 DIAGNOSIS — F331 Major depressive disorder, recurrent, moderate: Secondary | ICD-10-CM | POA: Diagnosis not present

## 2019-05-23 DIAGNOSIS — F17219 Nicotine dependence, cigarettes, with unspecified nicotine-induced disorders: Secondary | ICD-10-CM | POA: Diagnosis not present

## 2019-05-23 DIAGNOSIS — I998 Other disorder of circulatory system: Secondary | ICD-10-CM

## 2019-05-23 DIAGNOSIS — M5442 Lumbago with sciatica, left side: Secondary | ICD-10-CM | POA: Diagnosis not present

## 2019-05-23 IMAGING — RF DG SINUS / FISTULA TRACT / ABSCESSOGRAM
3 series · 6 of 6 positions shown · non-contrast
Comparison: none

INDICATION: 72-year-old with history of a periappendiceal abscess. The abscess
has resolved based on CT. Plan for drain injection and possible
removal.

[Series 1: one shot · 1 of 1 slices shown (1 of 2)]
[im 1/1]
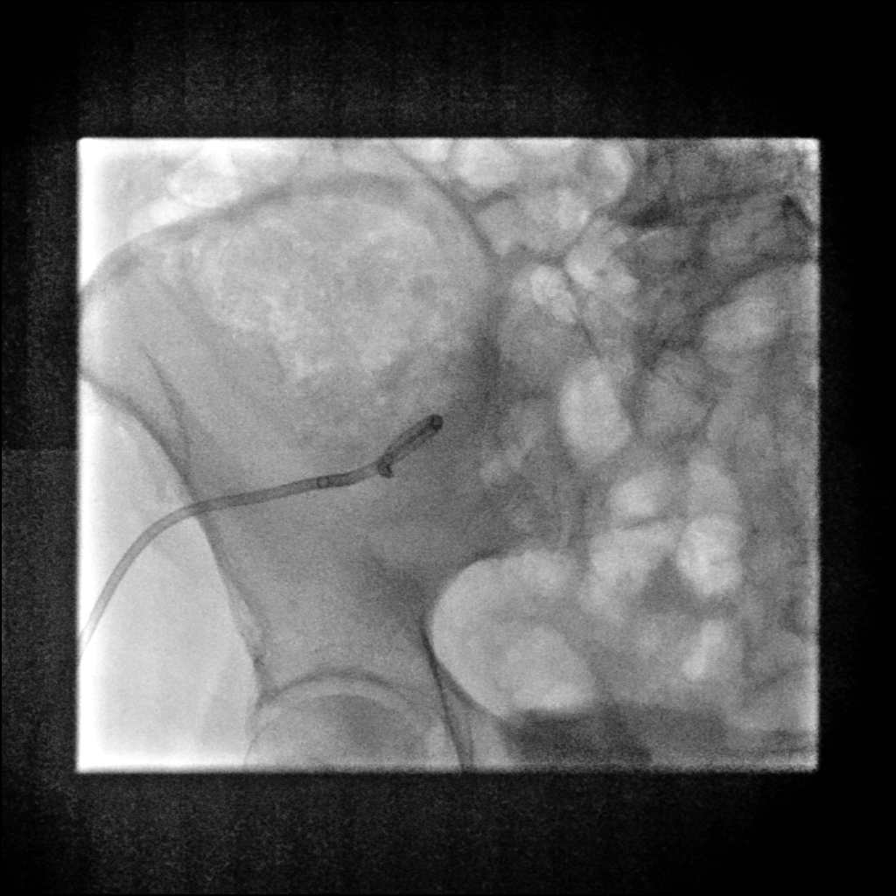

[Series 2: sequence · 4 of 51 frames shown]
[frame 8/51]
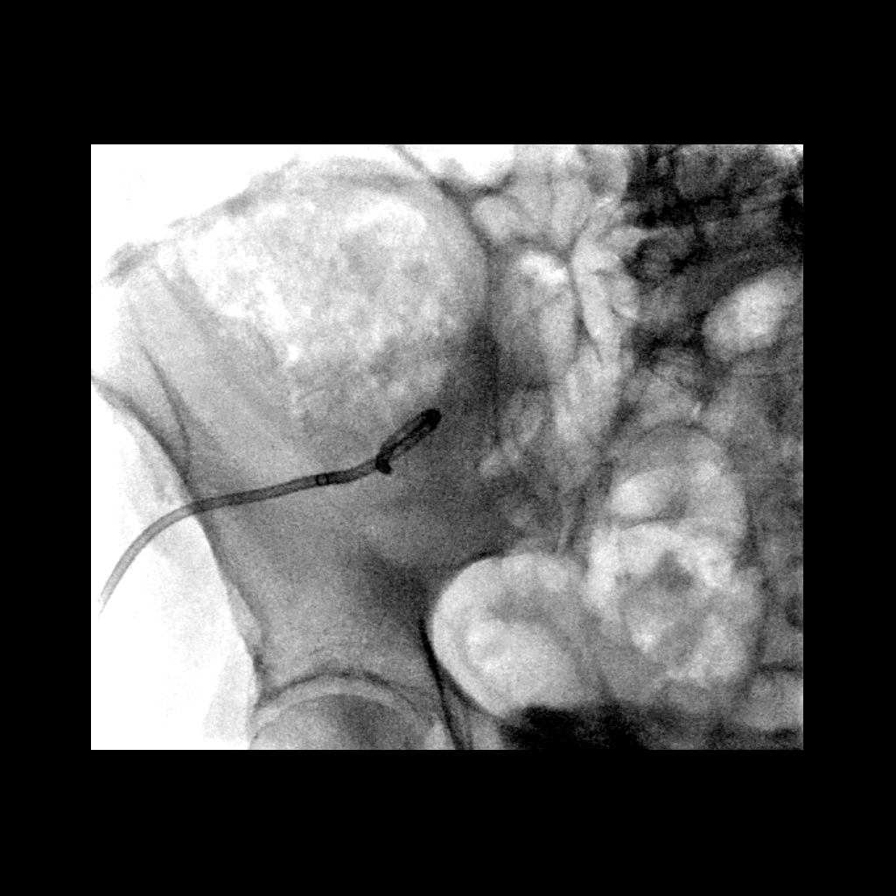
[frame 17/51]
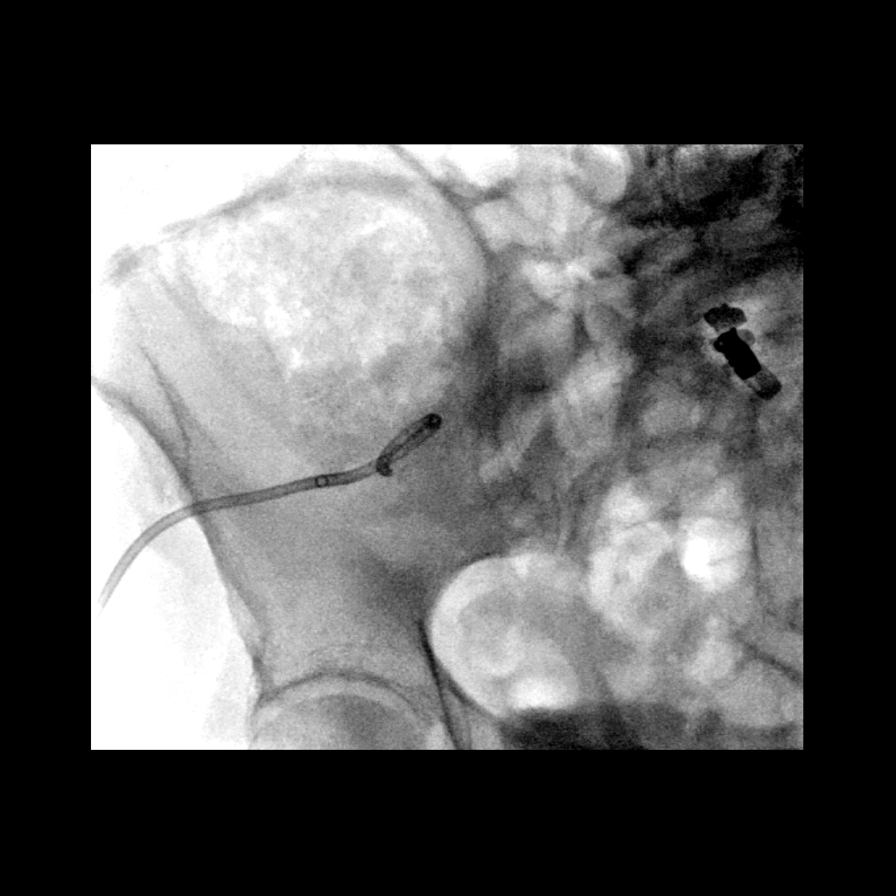
[frame 26/51]
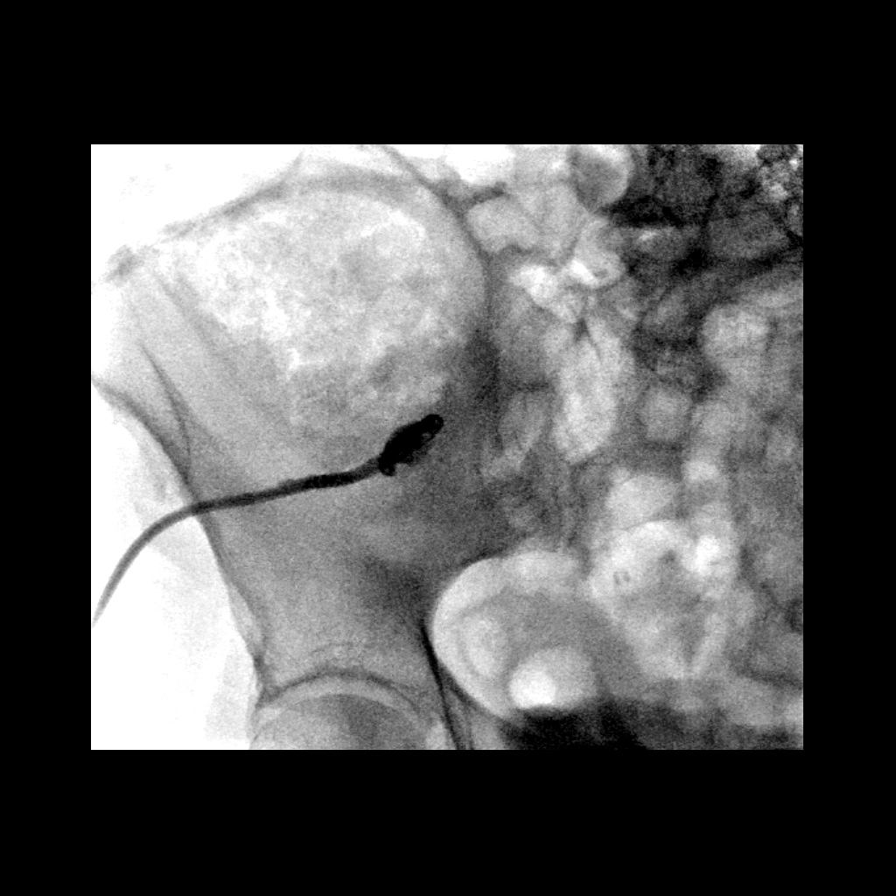
[frame 44/51]
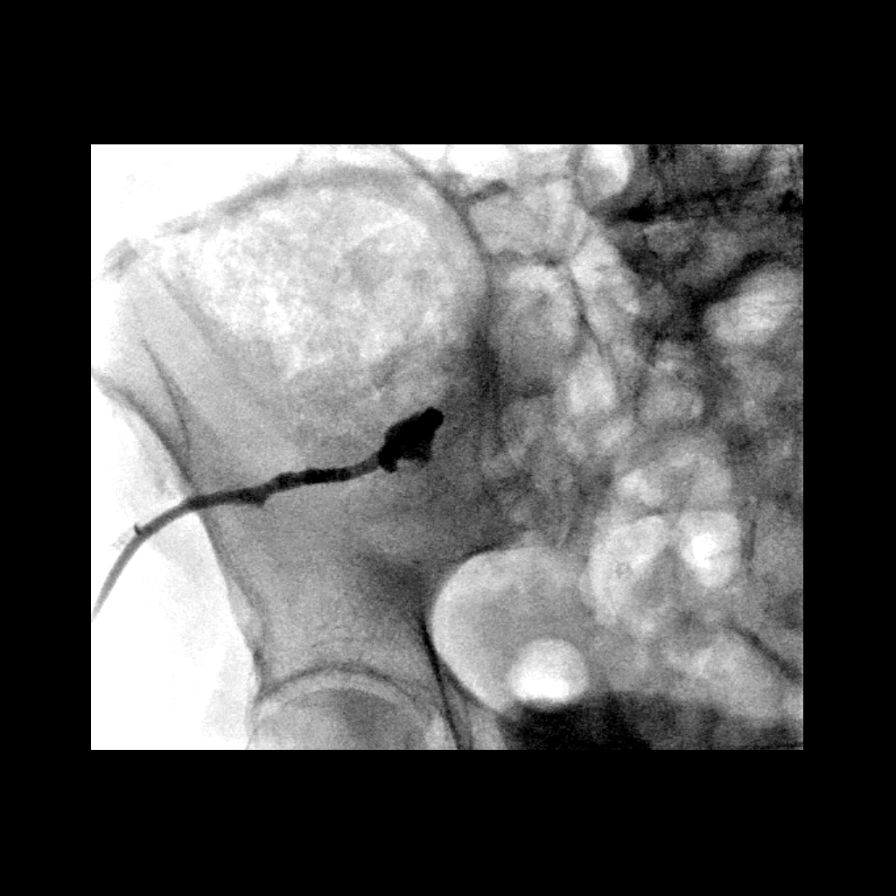

[Series 3: one shot · 0.15mm/px · 1 of 1 slices shown (2 of 2)]
[im 1/1]
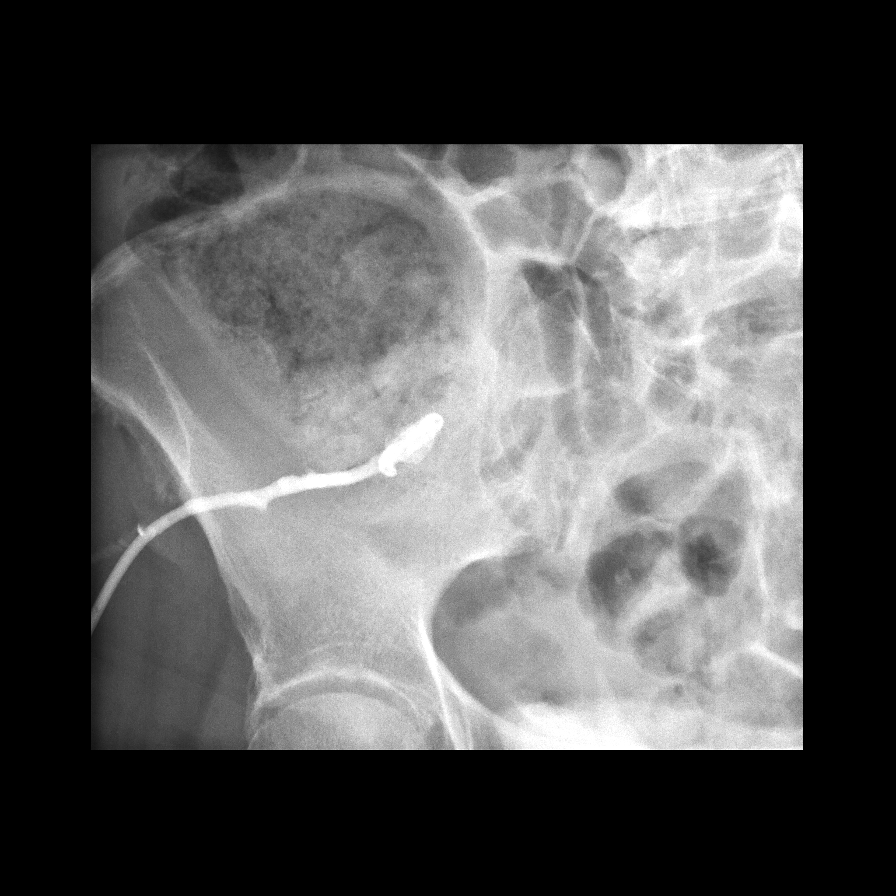

[6 of 6 positions shown; findings below may reference images not displayed]

EXAM:
ABSCESS DRAIN INJECTION

MEDICATIONS:
None

FLUOROSCOPY TIME:  18 seconds, 7 mGy

ANESTHESIA/SEDATION:
None

COMPLICATIONS:
None immediate.

PROCEDURE:
Patient was placed supine on the fluoroscopic table. Drain was
injected with 5 mL of Omnipaque 300. Images were reviewed. The drain
was cut and removed without complication. Bandage placed at the old
drain site.
FINDINGS: No abscess cavity around the drain. Contrast was preferentially
draining around the tube to the skin surface. No evidence for a
bowel fistula.
IMPRESSION: Abscess cavity has resolved. No evidence for a bowel fistula.
Therefore, the drain was removed.

## 2019-05-23 NOTE — Progress Notes (Signed)
Psychotherapy Progress Note [facility] Luan Moore, PhD LP  Patient ID: [name]     MRN: [id number]     Therapy format: Individual psychotherapy Date: 05/23/2019     Start: 3:21p Stop: 4:21p Time Spent: 60 min Location: telehealth   Telehealth visit -- I connected with this patient by an approved telecommunication method (video), with her informed consent, and verifying identity and patient privacy.  I was located at my office and patient at her home.  As needed, we discussed the limitations, risks, and security and privacy concerns associated with telehealth service, including the availability and conditions which currently govern in-person appointments and the possibility that 3rd-party payment may not be fully guaranteed and she may be responsible for charges.  After she indicated understanding, we proceeded with the session.  Also discussed treatment planning, as needed, including ongoing verbal agreement with the plan, the opportunity to ask and answer all questions, her demonstrated understanding of instructions, and her readiness to call the office should symptoms worsen or she feels she is in a crisis state and needs more immediate and tangible assistance.  Session narrative (presenting needs, interim history, self-report of stressors and symptoms, applications of prior therapy, status changes, and interventions made in session) Converted to telehealth due to dfx driving lately.  Got lost trying to find CT scan location yesterday.  Has signed agreement to sell her house, but has a 3-week rush on to get painting done and many belongings packed.  Feels resistance to the change.  Anxious with it.  Has had kids come in to help clear [name]'s clothes, has projects under way, sending some furniture to consignment but dismayed at the terms.  Rethinking whether to move to [place] -- has friends asking, but then they are moving out of the city themselves, she says.  Friends  here asking her to stay in [place].  Brother in law sentimental seeing the for sale sign, but avoids any offer, or even clarification, on the idea of moving in together as widowed inlaws.  Wanted to get a couple of deceased sister's pocketbooks, and a diamond ring she loaned her for life but BIL disbelieves and probably gave his own daughter.  Knows she can approach her niece.  Dog suffering in health.  Plans to talk to a medium to connect with her deceased sister.  C/o some dizziness and possible ataxia.  CT results found a small spot, suggestive of a COPD condition.  She does fall asleep in chair sometimes, suspected CO2 retention.  Lied to doctor about quitting, but has reduced smoking from 1.5 ppd to 0.75 ppd.  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  Wants to know if anything would help her memory and clarity -- offered another patient's experience and observable improvement with [place] [place] supplement, subject to checking with her physician(s) and/or pharmacist.  Offered mild hyperventilation as a tool to improve clarity momentarily and practiced in session to some benefit.  Offered poem "Warning" Blanchie Dessert) as an inspiration for her condition.  Therapeutic modalities: Cognitive Behavioral Therapy, Solution-Oriented/Positive Psychology, Humanistic/Existential and Ego-Supportive  Mental Status/Observations:  Appearance:   Casual     Behavior:  Appropriate  Motor:  Normal  Speech/Language:   Clear and Coherent  Affect:  Appropriate  Mood:  anxious and euthymic  Thought process:  normal and mildly tangential  Thought content:    WNL  Sensory/Perceptual disturbances:    c/o intermittent dizziness, relying on cane at times  Orientation:  grossly intact  Attention:  Good  Concentration:  Good  Memory:  WNL  Insight:    Good  Judgment:   Good  Impulse Control:  Good   Risk Assessment: Danger to Self: No Self-injurious Behavior: No Danger to Others: No Physical  Aggression / Violence: No Duty to Warn: No Access to Firearms a concern: No  Assessment of progress:  stabilized  Diagnosis:   ICD-10-CM   1. Generalized anxiety disorder  F41.1   2. Major depressive disorder, recurrent episode, moderate (HCC)  F33.1   3. Nicotine dependence, cigarettes, with unspecified nicotine-induced disorders  F17.219    suspected COPD  4. Mild cognitive impairment  G31.84   5. Bereavement  Z63.4   6. Critical lower limb ischemia  I99.8    Per medical chart -- is in longterm recovery from vascular surgery  7. Chronic left-sided low back pain with left-sided sciatica  M54.42    G89.29   8. History of CVA (cerebrovascular accident)  Z86.73    only followed by PCP    Plan:  . Manage rising and steady herself to guard against orthostatic effects . Communicate clearly with realtor if need extra time . Experiment with brief overbreathing to see if it addresses brain fog or reveals/mitigates COPD effect . Other recommendations/advice as noted above . Continue to utilize previously learned skills ad lib . Maintain medication as prescribed and work faithfully with relevant prescriber(s) if any changes are desired or seem indicated . Call the clinic on-call service, present to ER, or call 911 if any life-threatening psychiatric crisis Return in about 2 weeks (around 06/06/2019).  Blanchie Serve, PhD Luan Moore, PhD LP Clinical Psychologist, [facility] [address], [place], [place] [address] [phone]

## 2019-05-25 ENCOUNTER — Encounter: Payer: Self-pay | Admitting: Psychiatry

## 2019-05-26 ENCOUNTER — Ambulatory Visit (INDEPENDENT_AMBULATORY_CARE_PROVIDER_SITE_OTHER): Payer: Medicare Other | Admitting: Psychiatry

## 2019-05-26 ENCOUNTER — Other Ambulatory Visit: Payer: Self-pay | Admitting: Pulmonary Disease

## 2019-05-26 ENCOUNTER — Encounter: Payer: Self-pay | Admitting: Psychiatry

## 2019-05-26 DIAGNOSIS — G2581 Restless legs syndrome: Secondary | ICD-10-CM | POA: Diagnosis not present

## 2019-05-26 DIAGNOSIS — G4721 Circadian rhythm sleep disorder, delayed sleep phase type: Secondary | ICD-10-CM | POA: Diagnosis not present

## 2019-05-26 DIAGNOSIS — Z8673 Personal history of transient ischemic attack (TIA), and cerebral infarction without residual deficits: Secondary | ICD-10-CM | POA: Diagnosis not present

## 2019-05-26 DIAGNOSIS — I779 Disorder of arteries and arterioles, unspecified: Secondary | ICD-10-CM

## 2019-05-26 DIAGNOSIS — F411 Generalized anxiety disorder: Secondary | ICD-10-CM

## 2019-05-26 DIAGNOSIS — G3184 Mild cognitive impairment, so stated: Secondary | ICD-10-CM

## 2019-05-26 DIAGNOSIS — R768 Other specified abnormal immunological findings in serum: Secondary | ICD-10-CM

## 2019-05-26 DIAGNOSIS — F331 Major depressive disorder, recurrent, moderate: Secondary | ICD-10-CM | POA: Diagnosis not present

## 2019-05-26 DIAGNOSIS — Z634 Disappearance and death of family member: Secondary | ICD-10-CM | POA: Diagnosis not present

## 2019-05-26 NOTE — Progress Notes (Signed)
CLARITY WI [id number] 04/28/1946 73 y.o.   Virtual Visit via WebEx  I connected with pt by audio and visual WebEx app and verified that I am speaking with the correct person using two identifiers.   I discussed the limitations, risks, security and privacy concerns of performing an evaluation and management service by t WebEx and the availability of in person appointments. I also discussed with the patient that there may be a patient responsible charge related to this service. The patient expressed understanding and agreed to proceed.  I discussed the assessment and treatment plan with the patient. The patient was provided an opportunity to ask questions and all were answered. The patient agreed with the plan and demonstrated an understanding of the instructions.   The patient was advised to call back or seek an in-person evaluation if the symptoms worsen or if the condition fails to improve as anticipated.  I provided 30 minutes of non-face-to-face time during this encounter. The call started at 235 and ended at 3:05 o'clock. The patient was located at home and the provider was located office.   Subjective:   Patient ID:  [name] is a 73 y.o. (DOB November 28, 1945) female.  Chief Complaint:  Chief Complaint  Patient presents with  . Follow-up    Medication Management  . Depression    Medication Management  . Anxiety    Medication Management    Depression        Associated symptoms include decreased concentration and fatigue.  Associated symptoms include no suicidal ideas.  Past medical history includes anxiety.   Anxiety Symptoms include decreased concentration, nervous/anxious behavior and shortness of breath. Patient reports no confusion or suicidal ideas.     [name] presents to the office today for follow-up of depression and anxiety and MCI.    at visit January 09, 2019.  She was having problems with restless legs and ropinirole was increased to 3 mg every  afternoon.  There were no other med changes.  Last visit was Feb 05, 2019.  She was having mixture of depression and anxiety and chronic insomnia with delayed sleep phase.  For the reasons mentioned at the last visit she was started on risperidone 0.25 mg nightly.  She was maintained on duloxetine 90 mg, clonazepam 1 mg nightly, lamotrigine 150 twice daily, and ropinirole 2 mg 1-1/2 tablets nightly for restless legs.  Continued health problems with COPD.  Energy is low and hard time walking.  Arthritis in back.  House on the market and that's stressful.  Sx largely unchanged but "it's circumstances".  Planning to move to [facility] but ambivalent.  Brother in law lives next door and wants her to stay here.  Also stressed over politics and social things.    Felt weird on risperidone and only took it a few days.  Had nausea and spaced out.  Still has some nausea.    Feels stuck and reduced concentration and productivity.  Frozen in space.  A lot is grief over [name]'s death and trying to get things done feeling under pressure.  Weird anger outbursts yelling at people in public and then in 10 minutes it's gone.  Anger on and off.   Anxiety and depression are similar.  Terrible energy.  Poor diet.  Sweets make her hyper.  Going to bed 5am the last week and up at noon.  Just sits in front of TV procrastinating going to bed.  Allergies she is having some rapid mood swings.  RLS managed.  Sleep pretty good, except irregular hours.Pt reports that mood is Depressed and describes anxiety as worse and moderate in the same dose for depression.  She is also having problems with irritability. Anxiety symptoms include: Excessive Worry, Panic Symptoms,. Pt reports irregular sleep pattern chronically. Pt reports that appetite is decreased. Pt reports that energy is poor and loss of interest or pleasure in usual activities, poor motivation and withdrawn from usual activities. Concentration is scattered. Crying spells.  Guilt  feelings over relations with [name].  Suicidal thoughts:  denied by patient.  Increased ropinirole helped RLS.  Her sister had bipolar disorder.  Past Psychiatric Medication Trials: Lithium 300 insomnia, Depakote, olanzapine 2.5,  lamotrigine 300, duloxetine 90, ropinirole as high as 2 mg 3 times daily for restless legs, Aricept poorly tolerated, Abilify side effects, fluoxetine, sertraline, paroxetine, Lexapro Long psychiatric history.  Sister bipolar. I have never prescribed lorazepam nor Xanax  Review of Systems:  Review of Systems  Constitutional: Positive for fatigue.  Respiratory: Positive for shortness of breath.   Cardiovascular:       Claudication   Neurological: Negative for tremors and weakness.  Psychiatric/Behavioral: Positive for decreased concentration, depression, dysphoric mood and sleep disturbance. Negative for agitation, behavioral problems, confusion, hallucinations, self-injury and suicidal ideas. The patient is nervous/anxious. The patient is not hyperactive.    LEG surgery for claudication Jan 3  Medications: I have reviewed the patient's current medications.  Current Outpatient Medications  Medication Sig Dispense Refill  . amLODipine (NORVASC) 10 MG tablet Take 1 tablet (10 mg total) by mouth daily. 90 tablet 1  . clonazePAM (KLONOPIN) 0.5 MG tablet Take 1 tab at bedtime and 1 tab PRN for anxiety.  Please do not take the daytime dose unless absolutely necessary 40 tablet 1  . DULoxetine (CYMBALTA) 30 MG capsule Take 1 capsule (30 mg total) by mouth every evening. 90 capsule 1  . DULoxetine (CYMBALTA) 60 MG capsule TAKE 1 CAPSULE BY MOUTH EVERY DAY 90 capsule 1  . famotidine (PEPCID) 20 MG tablet One at bedtime (Patient taking differently: Take 20 mg by mouth daily as needed for heartburn or indigestion (or nausea). ) 30 tablet 11  . lamoTRIgine (LAMICTAL) 150 MG tablet TAKE 1 TABLET(150 MG) BY MOUTH TWICE DAILY 180 tablet 0  . linaclotide (LINZESS) 145 MCG  CAPS capsule Take 1 capsule (145 mcg total) by mouth daily before breakfast. 30 capsule 5  . losartan (COZAAR) 50 MG tablet Take 1 tablet (50 mg total) by mouth daily. 90 tablet 1  . polyethylene glycol powder (GLYCOLAX/MIRALAX) powder Take 17 g by mouth every other day.    . Probiotic Product (PROBIOTIC-10 ULTIMATE PO) Take by mouth.    Marland Kitchen rOPINIRole (REQUIP) 2 MG tablet TAKE 1 AND 1/2 TABLETS(3 MG) BY MOUTH AT BEDTIME 135 tablet 0   No current facility-administered medications for this visit.     Medication Side Effects: None  Allergies: No Known Allergies  Past Medical History:  Diagnosis Date  . AAA (abdominal aortic aneurysm) (HCC)    3.1 cm 07/08/18, 3 year follow-up recommended; possible 3 cm AAA by aortogram 09/13/18  . Anemia    PMH  . Appendicitis with abscess    07/08/18, s/p perc drain; resolved 07/30/18 by CT  . Arthritis   . Bipolar disorder ([place])   . Cerebrovascular disease    intra and extracranial vascular dx per MRI 4/11, neurology rec strict CVRF control  . Colonic inertia   . Constipation  chronic;severe  . Coronary artery disease   . Depression   . Duodenitis   . EKG abnormalities    changes, stress test neg (false EKG changes)  . Gastritis   . GERD (gastroesophageal reflux disease)   . Hypertension   . Hypothyroid 01/16/2014  . Meningioma ([place]) 10/21/2013  . PAD (peripheral artery disease) ([place])   . Psoriasis    sees derm  . Stroke ([place])   . Wears dentures   . Wears glasses     Family History  Problem Relation Age of Onset  . Breast cancer Mother        metastisis to bones  . Diabetes Son   . Heart disease Other        grandfather   . Alcohol abuse Brother   . Heart disease Brother   . Heart disease Maternal Aunt   . Lung cancer Brother        smoked  . Colon cancer Neg Hx     Social History   Socioeconomic History  . Marital status: Widowed    Spouse name: Not on file  . Number of children: 1  . Years of education: Not on file  .  Highest education level: Not on file  Occupational History  . Occupation: retired  Scientific laboratory technician  . Financial resource strain: Not hard at all  . Food insecurity    Worry: Patient refused    Inability: Patient refused  . Transportation needs    Medical: Patient refused    Non-medical: Patient refused  Tobacco Use  . Smoking status: Current Every Day Smoker    Packs/day: 0.75    Years: 60.00    Pack years: 45.00    Types: Cigarettes  . Smokeless tobacco: Never Used  Substance and Sexual Activity  . Alcohol use: Yes    Alcohol/week: 0.0 standard drinks    Comment: yes on occassion  . Drug use: No  . Sexual activity: Not Currently  Lifestyle  . Physical activity    Days per week: Patient refused    Minutes per session: Patient refused  . Stress: Not on file  Relationships  . Social Herbalist on phone: Patient refused    Gets together: Patient refused    Attends religious service: Patient refused    Active member of club or organization: Patient refused    Attends meetings of clubs or organizations: Patient refused    Relationship status: Patient refused  . Intimate partner violence    Fear of current or ex partner: Patient refused    Emotionally abused: Patient refused    Physically abused: Patient refused    Forced sexual activity: Patient refused  Other Topics Concern  . Not on file  Social History Narrative   Brother in law Mr Rexford Maus (one of my patients)   Lives w/ husband        Past Medical History, Surgical history, Social history, and Family history were reviewed and updated as appropriate.   Please see review of systems for further details on the patient's review from today.   Objective:   Physical Exam:  There were no vitals taken for this visit.  Physical Exam Neurological:     Mental Status: She is alert and oriented to person, place, and time.     Cranial Nerves: No dysarthria.  Psychiatric:        Attention and Perception: She  is inattentive. She does not perceive auditory hallucinations.  Mood and Affect: Mood is anxious and depressed.        Speech: Speech normal. Speech is not rapid and pressured or slurred.        Behavior: Behavior is not slowed or aggressive. Behavior is cooperative.        Thought Content: Thought content normal. Thought content is not paranoid or delusional. Thought content does not include homicidal or suicidal ideation. Thought content does not include homicidal or suicidal plan.        Cognition and Memory: Memory is impaired.     Comments: She is somewhat chronically scattered and easily confused and that is continuing but partly related to grief and not sleeping as well lately since John Day died.  Still depressed, anxious and irritable.  Insight and judgment are fair     Lab Review:     Component Value Date/Time   NA 138 04/04/2019 1536   K 4.5 04/04/2019 1536   CL 98 04/04/2019 1536   CO2 28 04/04/2019 1536   GLUCOSE 99 04/04/2019 1536   GLUCOSE 100 (H) 11/20/2018 1842   BUN 17 04/04/2019 1536   CREATININE 0.85 04/04/2019 1536   CREATININE 0.77 07/28/2015 1805   CALCIUM 9.5 04/04/2019 1536   PROT 7.8 11/20/2018 1842   PROT 7.4 03/05/2018 1758   ALBUMIN 3.8 11/20/2018 1842   ALBUMIN 4.4 03/05/2018 1758   AST 24 11/20/2018 1842   ALT 21 11/20/2018 1842   ALKPHOS 100 11/20/2018 1842   BILITOT 0.6 11/20/2018 1842   BILITOT 0.3 03/05/2018 1758   GFRNONAA 67 04/04/2019 1536   GFRAA 78 04/04/2019 1536       Component Value Date/Time   WBC 6.2 04/04/2019 1536   WBC 9.5 11/20/2018 1842   RBC 4.37 04/04/2019 1536   RBC 4.31 11/20/2018 1842   HGB 12.6 04/04/2019 1536   HCT 37.8 04/04/2019 1536   PLT 257 04/04/2019 1536   MCV 87 04/04/2019 1536   MCH 28.8 04/04/2019 1536   MCH 29.2 11/20/2018 1842   MCHC 33.3 04/04/2019 1536   MCHC 31.5 11/20/2018 1842   RDW 14.6 04/04/2019 1536   LYMPHSABS 2.3 11/20/2018 1842   MONOABS 1.0 11/20/2018 1842   EOSABS 0.4  11/20/2018 1842   BASOSABS 0.1 11/20/2018 1842    No results found for: POCLITH, LITHIUM   No results found for: PHENYTOIN, PHENOBARB, VALPROATE, CBMZ   .res Assessment: Plan:    Major depressive disorder, recurrent episode, moderate (HCC)  Generalized anxiety disorder  Mild cognitive impairment  Bereavement  Restless legs syndrome  Delayed sleep phase syndrome  History of CVA (cerebrovascular accident)  Amore has some chronic depression and chronic irregular sleep patterns which have been resistant to treatment.  Her chronic depression and anxiety are complicated now by double grief of loss of her sister and now her husband.  She is a little more scattered as a result as well and is not sleeping as well.  Her insomnia and delayed sleep phase are chronic and somewhat the result of poor sleep hygiene.  We discussed sleep hygiene in detail.  Sleep hygiene issues discussed although this is been very resistant to change.  We need to make sure she is not drinking too much caffeine especially late in the day.  Hesitate to increase lamotrigine DT her age and risk of SE including balance and cognitive issues.  Cont counseling for grief, depression, anxiety.   Use pill box to improve compliance.  Discussed serotonin withdrawal symptoms.  Also the risk of  greater depression because of missing the meds. Still taking the duloxetine 60+30 mg.  Occ forgets and feels bad.  Disc SSRI withdrawal which she gets when she forgets.  Therefore I am hesitant to increase the dosage further.  The mood lability along with a family history of bipolar disorder makes me think a small dose of an antipsychotic may have a calming effect.  However she did not tolerate risperidone even at 0.25 mg daily.  I still suspect this could be some variant of bipolar disorder but that is not certain.   Because of the side effects, DC risperidone 0.25 mg 1 q. P.m.  She stopped the donepezil bc GI upset.  Consider Exelon  patch for cognition.  But defer this because she is not in a condition to start a new medication at this time.  Continue ropinirole 3 mg for RLS.  This is better controlled her restless legs.  Disc SE incl hallucinations and dizziness.   This high dosage appears medically necessary.  Call if this fails and will consider doing something with the clonazepam either increasing it or perhaps switching to another benzodiazepine which might have less cognitive problems.  Such as lorazepam  Emphasized importance and proper nutrition and sleep and protein.  Doesn't eat much meat bc constipation but will eat fish.  Discussed how protein is necessary for the proper response to depression medicines  She doesn't want med changes this visit.  FU 3-4 mos.  Lynder Parents, MD, DFAPA  Please see After Visit Summary for patient specific instructions.  Future Appointments  Date Time Provider [place]  08/25/2019  1:00 PM [facility]  08/25/2019  2:00 PM [facility]  08/27/2019  1:45 PM Nickel, Sharmon Leyden, NP [facility]    No orders of the defined types were placed in this encounter.     -------------------------------

## 2019-05-28 ENCOUNTER — Encounter: Payer: Self-pay | Admitting: Family Medicine

## 2019-05-28 ENCOUNTER — Telehealth: Payer: Self-pay | Admitting: Pulmonary Disease

## 2019-05-28 NOTE — Telephone Encounter (Signed)
LMTCB  In the meantime will forward to Dr Ander Slade for results of CT, thanks

## 2019-05-29 NOTE — Telephone Encounter (Signed)
Called patient  Left message to call back Generic voicemail- did not leave any identifiable info, requested pt to call to let me know when its convenient to call her back

## 2019-06-03 ENCOUNTER — Telehealth: Payer: Self-pay | Admitting: Pulmonary Disease

## 2019-06-03 DIAGNOSIS — R768 Other specified abnormal immunological findings in serum: Secondary | ICD-10-CM

## 2019-06-03 DIAGNOSIS — J84112 Idiopathic pulmonary fibrosis: Secondary | ICD-10-CM

## 2019-06-03 NOTE — Telephone Encounter (Signed)
ATC pt, no answer. Left message for pt to call back.  

## 2019-06-03 NOTE — Telephone Encounter (Signed)
Patient is returning the call. CB is [phone]

## 2019-06-03 NOTE — Telephone Encounter (Signed)
Rheumatology referral placed as requested.  Per AO's documentation, pt is already aware of this referral.  Nothing further needed at this time- will close encounter.

## 2019-06-03 NOTE — Telephone Encounter (Signed)
Called the patient back, let her know Dr. Ander Slade was trying to reach her. He is still in clinic this afternoon, but wanted to know a good time to call her back to discuss the results.  Patient stated confirmed call back around 4:!5, after he has completed clinic for the day. She will be home all afternoon.  Message routed to Dr. Ander Slade and note placed on desk as fyi.

## 2019-06-03 NOTE — Telephone Encounter (Signed)
Referral to rheumatology  Patient with back pain, joint pain Pain is getting more significant with weakness  Positive ANA  Work-up for connective tissue disease interstitial lung disease   I did speak with patient just now, made aware of CT scan findings and let her know we will be referring her to rheumatology

## 2019-06-03 NOTE — Addendum Note (Signed)
Addended by: Len Blalock on: 06/03/2019 04:54 PM   Modules accepted: Orders

## 2019-06-04 ENCOUNTER — Telehealth: Payer: Self-pay | Admitting: Pulmonary Disease

## 2019-06-04 NOTE — Telephone Encounter (Signed)
Labs have been faxed as we referred the pt to [facility]. Nothing further was needed.

## 2019-06-06 NOTE — Telephone Encounter (Signed)
Dr. Ander Slade is back in the office on 06/11/2019.

## 2019-06-11 ENCOUNTER — Other Ambulatory Visit: Payer: Self-pay | Admitting: Psychiatry

## 2019-06-12 NOTE — Telephone Encounter (Signed)
Looks from previous message Dr. Ander Slade called and spoke with patient on 06/03/19  see later telephone messages  Will close this encounter

## 2019-06-12 NOTE — Telephone Encounter (Signed)
How often on refills, just filled 09/14

## 2019-06-26 DIAGNOSIS — M19072 Primary osteoarthritis, left ankle and foot: Secondary | ICD-10-CM | POA: Diagnosis not present

## 2019-06-26 DIAGNOSIS — M2550 Pain in unspecified joint: Secondary | ICD-10-CM | POA: Diagnosis not present

## 2019-06-26 DIAGNOSIS — M79671 Pain in right foot: Secondary | ICD-10-CM | POA: Diagnosis not present

## 2019-06-26 DIAGNOSIS — M19042 Primary osteoarthritis, left hand: Secondary | ICD-10-CM | POA: Diagnosis not present

## 2019-06-26 DIAGNOSIS — M549 Dorsalgia, unspecified: Secondary | ICD-10-CM | POA: Diagnosis not present

## 2019-06-26 DIAGNOSIS — M5136 Other intervertebral disc degeneration, lumbar region: Secondary | ICD-10-CM | POA: Diagnosis not present

## 2019-06-26 DIAGNOSIS — M19041 Primary osteoarthritis, right hand: Secondary | ICD-10-CM | POA: Diagnosis not present

## 2019-06-26 DIAGNOSIS — M542 Cervicalgia: Secondary | ICD-10-CM | POA: Diagnosis not present

## 2019-06-26 DIAGNOSIS — R5383 Other fatigue: Secondary | ICD-10-CM | POA: Diagnosis not present

## 2019-06-26 DIAGNOSIS — M79643 Pain in unspecified hand: Secondary | ICD-10-CM | POA: Diagnosis not present

## 2019-06-26 DIAGNOSIS — J449 Chronic obstructive pulmonary disease, unspecified: Secondary | ICD-10-CM | POA: Diagnosis not present

## 2019-06-26 DIAGNOSIS — M79642 Pain in left hand: Secondary | ICD-10-CM | POA: Diagnosis not present

## 2019-06-26 DIAGNOSIS — D8989 Other specified disorders involving the immune mechanism, not elsewhere classified: Secondary | ICD-10-CM | POA: Diagnosis not present

## 2019-06-26 DIAGNOSIS — M79672 Pain in left foot: Secondary | ICD-10-CM | POA: Diagnosis not present

## 2019-06-26 DIAGNOSIS — M1990 Unspecified osteoarthritis, unspecified site: Secondary | ICD-10-CM | POA: Diagnosis not present

## 2019-06-26 DIAGNOSIS — L409 Psoriasis, unspecified: Secondary | ICD-10-CM | POA: Diagnosis not present

## 2019-06-26 DIAGNOSIS — M19071 Primary osteoarthritis, right ankle and foot: Secondary | ICD-10-CM | POA: Diagnosis not present

## 2019-06-26 DIAGNOSIS — J8410 Pulmonary fibrosis, unspecified: Secondary | ICD-10-CM | POA: Diagnosis not present

## 2019-06-26 DIAGNOSIS — Z79899 Other long term (current) drug therapy: Secondary | ICD-10-CM | POA: Diagnosis not present

## 2019-06-26 DIAGNOSIS — R768 Other specified abnormal immunological findings in serum: Secondary | ICD-10-CM | POA: Diagnosis not present

## 2019-06-26 DIAGNOSIS — M79641 Pain in right hand: Secondary | ICD-10-CM | POA: Diagnosis not present

## 2019-07-04 ENCOUNTER — Encounter: Payer: Self-pay | Admitting: Family Medicine

## 2019-07-10 DIAGNOSIS — M1990 Unspecified osteoarthritis, unspecified site: Secondary | ICD-10-CM | POA: Diagnosis not present

## 2019-07-10 DIAGNOSIS — R768 Other specified abnormal immunological findings in serum: Secondary | ICD-10-CM | POA: Diagnosis not present

## 2019-07-10 DIAGNOSIS — L409 Psoriasis, unspecified: Secondary | ICD-10-CM | POA: Diagnosis not present

## 2019-07-10 DIAGNOSIS — M2550 Pain in unspecified joint: Secondary | ICD-10-CM | POA: Diagnosis not present

## 2019-07-10 DIAGNOSIS — J449 Chronic obstructive pulmonary disease, unspecified: Secondary | ICD-10-CM | POA: Diagnosis not present

## 2019-07-10 DIAGNOSIS — R5383 Other fatigue: Secondary | ICD-10-CM | POA: Diagnosis not present

## 2019-07-10 DIAGNOSIS — J8410 Pulmonary fibrosis, unspecified: Secondary | ICD-10-CM | POA: Diagnosis not present

## 2019-07-10 DIAGNOSIS — M549 Dorsalgia, unspecified: Secondary | ICD-10-CM | POA: Diagnosis not present

## 2019-07-10 DIAGNOSIS — M79643 Pain in unspecified hand: Secondary | ICD-10-CM | POA: Diagnosis not present

## 2019-07-28 ENCOUNTER — Other Ambulatory Visit: Payer: Self-pay | Admitting: Psychiatry

## 2019-07-28 NOTE — Telephone Encounter (Signed)
How often refills on clonazepam?

## 2019-07-29 ENCOUNTER — Other Ambulatory Visit: Payer: Self-pay

## 2019-07-29 ENCOUNTER — Ambulatory Visit (INDEPENDENT_AMBULATORY_CARE_PROVIDER_SITE_OTHER): Payer: Medicare Other | Admitting: Psychiatry

## 2019-07-29 DIAGNOSIS — F411 Generalized anxiety disorder: Secondary | ICD-10-CM

## 2019-07-29 DIAGNOSIS — F332 Major depressive disorder, recurrent severe without psychotic features: Secondary | ICD-10-CM

## 2019-07-29 DIAGNOSIS — G4721 Circadian rhythm sleep disorder, delayed sleep phase type: Secondary | ICD-10-CM

## 2019-07-29 DIAGNOSIS — Z634 Disappearance and death of family member: Secondary | ICD-10-CM

## 2019-07-29 DIAGNOSIS — G3184 Mild cognitive impairment, so stated: Secondary | ICD-10-CM | POA: Diagnosis not present

## 2019-07-29 DIAGNOSIS — Z8673 Personal history of transient ischemic attack (TIA), and cerebral infarction without residual deficits: Secondary | ICD-10-CM

## 2019-07-29 NOTE — Progress Notes (Addendum)
Psychotherapy Progress Note [facility] Luan Moore, PhD LP  Patient ID: [name]     MRN: [id number]     Therapy format: Individual psychotherapy Date: 07/29/2019     Start: 1:10p Stop: 2:00p Time Spent: 50 min Location: telehealth   Telehealth visit -- I connected with this patient by an approved telecommunication method (audio only), with her informed consent, and verifying identity and patient privacy.  I was located at my office and patient at her home.  As needed, we discussed the limitations, risks, and security and privacy concerns associated with telehealth service, including the availability and conditions which currently govern in-person appointments and the possibility that 3rd-party payment may not be fully guaranteed and she may be responsible for charges.  After she indicated understanding, we proceeded with the session.  Also discussed treatment planning, as needed, including ongoing verbal agreement with the plan, the opportunity to ask and answer all questions, her demonstrated understanding of instructions, and her readiness to call the office should symptoms worsen or she feels she is in a crisis state and needs more immediate and tangible assistance.  Session narrative (presenting needs, interim history, self-report of stressors and symptoms, applications of prior therapy, status changes, and interventions made in session) Stomach flu right now, precipitating change from in-person to telehealth session.  Been feeling very depressed, felt it has reached crisis.  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  Re-engaged, some interest now in 2nd month on the market, but now leaning against trying to move back to [place] since feeling brushed off by family re. [place] visits.  Particularly downcast by son [name] telling her in text she would have to make amends with [name], his wife, with  whom there is longstanding tension and unresolved animosity and who PT frankly did not want in son's life.  Granddaughter Janett Billow and her husband are a much warmer, less loaded connection in the area, but she is an active mother and failed to follow up on a proposal to stay with them, PT feels disregarded.  Other, more furious feedback from before.  Feeling low for it, facing the holidays without [place], and hoped-for access to family in [place], plus initial disappointment of no results selling the house.  Nino, her BIL, next door, has been distancing, too, plus aging and in poorer health himself, living alone in the home where PT's beloved sister died.  Finds herself grieving relationships with her niece and nephew, as well, citing unfulfilled promise to call her and BIL now referring to her as "Maisha" to them, omitting the term of endearment "aunt" or "auntie".  Tempted to adopt the hostility her sister developed in her last years before identifying and succumbing to cancer.  Confesses she is thinking of suicide as well, prompted in part by son outright spiting her over her political leanings in a way that seemed to clearly cast her as enemy rather than family.  Added to it, a friend ([name], dramatic man, platonic relationship) of 30 years took it personally and dropped her after she didn't return calls (in bereavement) for 6 weeks.  Agreed all the losses are plenty lonely-making, though she also has to fight the urge to lash out, or lobby loved ones desperately in a way that would drive away what remains and act out her sister's errors in later life.  Has been spending time with a medium, communicating with mother, sister,  and husband, and says she can feel sister in the right side of her body.    C/o medications making her nauseous, is shorting them now for 3 months.  States she is only taking Cymbalta, Lamotrigine, ropinirole in half doses, other meds not taking.  "OD-ing on TV", can't read.   Confronted likelihood she is underfunding her capacity to adjust at the same time as she is having to adjust to too many things.  Needs to work out gastric distress and be in touch with doctors to restore reliable medication, but encouraged not to disregard risks of blood pressure spikes and depression becoming so potent she is badly tempted to suicide.  Secured pledge of safety and recommended Radio broadcast assistant Resources to engage further personal support during isolation, pandemic, bereavement, and uncertain attachments.  May even want to volunteer herself at some point to be a phone companion for another older adult.  Med compliance concerns marked on EHR and message to in-house prescriber and nurse re. Any mechanisms for establishing nursing in-reach service.  Therapeutic modalities: Ego-Supportive and problem-solving  Mental Status/Observations:  Appearance:   Not assessed     Behavior:  Appropriate  Motor:  Not assessed  Speech/Language:   Clear and Coherent  Affect:  Not assessed  Mood:  depressed and anxious, responsive to engagement and support  Thought process:  mildly loose  Thought content:    WNL and some rumination  Sensory/Perceptual disturbances:    WNL  Orientation:  Fully oriented  Attention:  Good  Concentration:  Good  Memory:  grossly intact  Insight:    Good  Judgment:   Fair  Impulse Control:  Fair   Risk Assessment: Danger to Self: recent SI, pledge of safety Self-injurious Behavior: No Danger to Others: No Physical Aggression / Violence: No Duty to Warn: No Access to Firearms a concern: No  Assessment of progress:  situational setback(s)  Diagnosis:   ICD-10-CM   1. Severe episode of recurrent major depressive disorder, without psychotic features ([place])  F33.2   2. Mild cognitive impairment  G31.84   3. Generalized anxiety disorder  F41.1   4. Bereavement  Z63.4   5. 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 History of CVA (cerebrovascular accident)  Z86.73      Plan:  . Pledges to follow up with granddaughter Janett Billow about possible [place] visit, fact-check perceived shutout . Care for GI problems appropriately . Try to reinstate medications, at least get valid medical advice on holding down meds rather than give up . May need nursing check-ins, [place] will notify prescriber and possibly PCP as able o Later IM to PCP and Dr. Clovis Pu, text with PT confirm PCP's office reached out to troubleshoot, scheduling PT for 11/20, and she has meanwhile made use of "antacids" (acid controller?) to ease GI distress and is resuming normal dosing. . Other recommendations/advice as noted above . Continue to utilize previously learned skills ad lib . Maintain medication as prescribed and work faithfully with relevant prescriber(s) if any changes are desired or seem indicated . Call the clinic on-call service, present to ER, or call 911 if any life-threatening psychiatric crisis Return in about 2 weeks (around 08/12/2019) for time as available, see prescriber soon, available earlier @ PT's need. Current [facility] appointments: Future Appointments  Date Time Provider [place]  08/25/2019  1:00 PM [facility] - [facility]  08/25/2019  2:00 PM [facility] - [facility]  08/27/2019  1:45 PM Nickel, Sharmon Leyden, NP [facility]    Blanchie Serve, PhD Luan Moore, PhD LP Clinical Psychologist, [facility] [address], [place], [place] [address] [phone]

## 2019-08-01 ENCOUNTER — Other Ambulatory Visit: Payer: Self-pay

## 2019-08-01 ENCOUNTER — Telehealth (INDEPENDENT_AMBULATORY_CARE_PROVIDER_SITE_OTHER): Payer: Medicare Other | Admitting: Family Medicine

## 2019-08-01 DIAGNOSIS — I10 Essential (primary) hypertension: Secondary | ICD-10-CM | POA: Diagnosis not present

## 2019-08-01 DIAGNOSIS — I779 Disorder of arteries and arterioles, unspecified: Secondary | ICD-10-CM

## 2019-08-01 DIAGNOSIS — I739 Peripheral vascular disease, unspecified: Secondary | ICD-10-CM

## 2019-08-01 DIAGNOSIS — R112 Nausea with vomiting, unspecified: Secondary | ICD-10-CM

## 2019-08-01 DIAGNOSIS — F329 Major depressive disorder, single episode, unspecified: Secondary | ICD-10-CM | POA: Diagnosis not present

## 2019-08-01 DIAGNOSIS — R197 Diarrhea, unspecified: Secondary | ICD-10-CM

## 2019-08-01 NOTE — Progress Notes (Signed)
CC- f/u from counselor visit- Patient stated she received a call from Dr Carlota Raspberry stating she need to have a f/u visit with him due to information Dr Carlota Raspberry received from her counselor. GAD7= 13 PHQ9=21

## 2019-08-01 NOTE — Patient Instructions (Addendum)
Try to check your blood pressure at least once per day for right now.  Especially with restarting your medications.  I would like you to follow-up with gastroenterology, will try to see if we can get you set up with them to talk about the nausea, vomiting, and diarrhea.  Small sips of fruit with fluids frequently when you have a nausea to make sure you are staying hydrated, but if you are having any increased abdominal pain fevers or acute worsening of symptoms go to the emergency room.   I would recommend discussing your current antidepressant medications with your psychiatrist as lowering doses may have worsened your symptoms.  Follow up with me in 2 weeks to review other concerns/medical history including the rheumatology, pulmonary work-up.  We can also discuss sciatica at that time but if any acute worsening symptoms please proceed to the emergency room.  Thank you for taking my call today and take care.  We will talk further in a couple weeks.  Return to the clinic or go to the nearest emergency room if any of your symptoms worsen or new symptoms occur.   Diarrhea, Adult Diarrhea is frequent loose and watery bowel movements. Diarrhea can make you feel weak and cause you to become dehydrated. Dehydration can make you tired and thirsty, cause you to have a dry mouth, and decrease how often you urinate. Diarrhea typically lasts 2-3 days. However, it can last longer if it is a sign of something more serious. It is important to treat your diarrhea as told by your health care provider. Follow these instructions at home: Eating and drinking     Follow these recommendations as told by your health care provider:  Take an oral rehydration solution (ORS). This is an over-the-counter medicine that helps return your body to its normal balance of nutrients and water. It is found at pharmacies and retail stores.  Drink plenty of fluids, such as water, ice chips, diluted fruit juice, and low-calorie  sports drinks. You can drink milk also, if desired.  Avoid drinking fluids that contain a lot of sugar or caffeine, such as energy drinks, sports drinks, and soda.  Eat bland, easy-to-digest foods in small amounts as you are able. These foods include bananas, applesauce, rice, lean meats, toast, and crackers.  Avoid alcohol.  Avoid spicy or fatty foods.  Medicines  Take over-the-counter and prescription medicines only as told by your health care provider.  If you were prescribed an antibiotic medicine, take it as told by your health care provider. Do not stop using the antibiotic even if you start to feel better. General instructions   Wash your hands often using soap and water. If soap and water are not available, use a hand sanitizer. Others in the household should wash their hands as well. Hands should be washed: ? After using the toilet or changing a diaper. ? Before preparing, cooking, or serving food. ? While caring for a sick person or while visiting someone in a hospital.  Drink enough fluid to keep your urine pale yellow.  Rest at home while you recover.  Watch your condition for any changes.  Take a warm bath to relieve any burning or pain from frequent diarrhea episodes.  Keep all follow-up visits as told by your health care provider. This is important. Contact a health care provider if:  You have a fever.  Your diarrhea gets worse.  You have new symptoms.  You cannot keep fluids down.  You feel light-headed or  dizzy.  You have a headache.  You have muscle cramps. Get help right away if:  You have chest pain.  You feel extremely weak or you faint.  You have bloody or black stools or stools that look like tar.  You have severe pain, cramping, or bloating in your abdomen.  You have trouble breathing or you are breathing very quickly.  Your heart is beating very quickly.  Your skin feels cold and clammy.  You feel confused.  You have signs of  dehydration, such as: ? Dark urine, very little urine, or no urine. ? Cracked lips. ? Dry mouth. ? Sunken eyes. ? Sleepiness. ? Weakness. Summary  Diarrhea is frequent loose and watery bowel movements. Diarrhea can make you feel weak and cause you to become dehydrated.  Drink enough fluids to keep your urine pale yellow.  Make sure that you wash your hands after using the toilet. If soap and water are not available, use hand sanitizer.  Contact a health care provider if your diarrhea gets worse or you have new symptoms.  Get help right away if you have signs of dehydration. This information is not intended to replace advice given to you by your health care provider. Make sure you discuss any questions you have with your health care provider. Document Released: 08/18/2002 Document Revised: 02/01/2018 Document Reviewed: 02/01/2018 Elsevier Patient Education  [employer].  Nausea and Vomiting, Adult Nausea is feeling sick to your stomach or feeling that you are about to throw up (vomit). Vomiting is when food in your stomach is thrown up and out of the mouth. Throwing up can make you feel weak. It can also make you lose too much water in your body (get dehydrated). If you lose too much water in your body, you may:  Feel tired.  Feel thirsty.  Have a dry mouth.  Have cracked lips.  Go pee (urinate) less often. Older adults and people with other diseases or a weak body defense system (immune system) are at higher risk for losing too much water in the body. If you feel sick to your stomach and you throw up, it is important to follow instructions from your doctor about how to take care of yourself. Follow these instructions at home: Watch your symptoms for any changes. Tell your doctor about them. Follow these instructions to care for yourself at home. Eating and drinking      Take an ORS (oral rehydration solution). This is a drink that is sold at pharmacies and  stores.  Drink clear fluids in small amounts as you are able, such as: ? Water. ? Ice chips. ? Fruit juice that has water added (diluted fruit juice). ? Low-calorie sports drinks.  Eat bland, easy-to-digest foods in small amounts as you are able, such as: ? Bananas. ? Applesauce. ? Rice. ? Low-fat (lean) meats. ? Toast. ? Crackers.  Avoid drinking fluids that have a lot of sugar or caffeine in them. This includes energy drinks, sports drinks, and soda.  Avoid alcohol.  Avoid spicy or fatty foods. General instructions  Take over-the-counter and prescription medicines only as told by your doctor.  Drink enough fluid to keep your pee (urine) pale yellow.  Wash your hands often with soap and water. If you cannot use soap and water, use hand sanitizer.  Make sure that all people in your home wash their hands well and often.  Rest at home while you get better.  Watch your condition for any changes.  Take slow and deep breaths when you feel sick to your stomach.  Keep all follow-up visits as told by your doctor. This is important. Contact a doctor if:  Your symptoms get worse.  You have new symptoms.  You have a fever.  You cannot drink fluids without throwing up.  You feel sick to your stomach for more than 2 days.  You feel light-headed or dizzy.  You have a headache.  You have muscle cramps.  You have a rash.  You have pain while peeing. Get help right away if:  You have pain in your chest, neck, arm, or jaw.  You feel very weak or you pass out (faint).  You throw up again and again.  You have throw up that is bright red or looks like black coffee grounds.  You have bloody or black poop (stools) or poop that looks like tar.  You have a very bad headache, a stiff neck, or both.  You have very bad pain, cramping, or bloating in your belly (abdomen).  You have trouble breathing.  You are breathing very quickly.  Your heart is beating very  quickly.  Your skin feels cold and clammy.  You feel confused.  You have signs of losing too much water in your body, such as: ? Dark pee, very little pee, or no pee. ? Cracked lips. ? Dry mouth. ? Sunken eyes. ? Sleepiness. ? Weakness. These symptoms may be an emergency. Do not wait to see if the symptoms will go away. Get medical help right away. Call your local emergency services (911 in the [place] Do not drive yourself to the hospital. Summary  Nausea is feeling sick to your stomach or feeling that you are about to throw up (vomit). Vomiting is when food in your stomach is thrown up and out of the mouth.  Follow instructions from your doctor about eating and drinking to keep from losing too much water in your body.  Take over-the-counter and prescription medicines only as told by your doctor.  Contact your doctor if your symptoms get worse or you have new symptoms.  Keep all follow-up visits as told by your doctor. This is important. This information is not intended to replace advice given to you by your health care provider. Make sure you discuss any questions you have with your health care provider. Document Released: 02/14/2008 Document Revised: 12/20/2018 Document Reviewed: 02/05/2018 Elsevier Patient Education  2020 [employer].

## 2019-08-01 NOTE — Progress Notes (Signed)
Virtual Visit via Telephone Note  I connected with [name] on 08/01/19 at 4:17 PM by telephone and verified that I am speaking with the correct person using two identifiers.   I discussed the limitations, risks, security and privacy concerns of performing an evaluation and management service by telephone and the availability of in person appointments. I also discussed with the patient that there may be a patient responsible charge related to this service. The patient expressed understanding and agreed to proceed, consent obtained  Chief complaint:  Med review.   History of Present Illness: [name] is a 73 y.o. female  History of peripheral arterial disease status post femoropopliteal bypass in February.  Required I&D of left thigh postoperatively for wound infection.  Last appointment with vascular in August.  Swelling was improved in the left leg, ABIs with improved perfusion  Followed by psychiatry Dr. Clovis Pu with [facility], appointment in September, and counseling , Dr. Rica Mote for depression with some worsening after her husband passed.  She has had some ongoing irregular sleep patterns and chronic depression that was resistant to treatment.  Depression anxiety complicated by double grief of loss of sister and her husband.  Some component for sleep hygiene with delayed sleep phase.  Hesitant to increase Lamictal due to age and risk of side effects including balance and cognitive issues.  Was continued on Cymbalta but noted occasionally forgetting medications, risk of SSRI withdrawal discussed.  Possible component of bipolar disorder given mood swings and family history of bipolar disorder but did not tolerate low-dose of Risperdal. Continued ropinirole 3 mg for restless leg syndrome. Plan for recheck in 3 to 4 months.   Had appointment few days ago with counselor Barron Schmid.  Concern from her counselor regarding her compliance with medications and ongoing  medical issues and stopping meds. SI and depression symptoms discussed with plan in place. plna to call suicide hotline if symptoms return. Denies active SI.  Increase in depression with halving meds past few months.   I last saw her in July for fatigue, dyspnea on exertion.  Previous had hyperglycemia, need for regular meals were discussed with RTC/ER precautions.  Referred to pulmonary. 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  Referred to rheumatology in September.    Was having more nausea past month or two. Had nausea in past. Some increased diarrhea/stomach cramps.  Diarrhea 1-3 per day. No bloody stools.  Last vomiting few weeks ago. Occasional vomiting past 2 months on and off.  Nausea on and off - not today.  Seen by [facility] in past - has been treated with Linzess in past. Stopped taking linzess about 2 months ago. Cut other meds in half due to nausea (cymbalta and lamictal). Has stopped amlodipine and losartan on own for past 2 months, restarted both yesterday and tolerating those ok.  Occasional flushed feeling but no measured fever.  Similar sx's in past - for 6 months in a row.  Has been keeping fluid down ok.  alleve before bed and melatonin.  No recent constipation.  Sciatica flaring up at times.  Home BP machine not working.    Patient Active Problem List   Diagnosis Date Noted  . Wound infection 11/07/2018  . Postoperative pain   . Sleep disturbance   . Tobacco abuse   . Acute blood loss anemia   . Hypoalbuminemia due to protein-calorie malnutrition ([place])   .  Debility 10/24/2018  . Pre-operative cardiovascular examination 09/26/2018  . HLD (hyperlipidemia) 09/26/2018  . GAD (generalized anxiety disorder) 08/07/2018  . Major depressive disorder, single episode 08/07/2018  . Appendiceal abscess 07/08/2018  . PVD (peripheral vascular disease) ([place]) 03/15/2018   . Atherosclerosis of native artery of left lower extremity with intermittent claudication ([place]) 03/15/2018  . Chronic left-sided low back pain with left-sided sciatica 12/25/2017  . Facial droop   . History of CVA (cerebrovascular accident) 10/23/2017  . Critical lower limb ischemia 11/08/2016  . Smoker 11/08/2016  . Postinflammatory pulmonary fibrosis ([place]) 02/14/2016  . Cigarette smoker 12/24/2015  . Hypothyroid ? 01/16/2014  . Mild cognitive impairment 01/16/2014  . Meningioma ([place]) 10/21/2013  . Chest pain 10/20/2013  . Medicare annual wellness visit, subsequent 06/16/2013  . Dizziness and giddiness 06/16/2013  . RLS (restless legs syndrome) 04/29/2012  . Abdominal pain 12/27/2010  . DEGENERATIVE JOINT DISEASE 09/23/2010  . CAROTID ARTERY DISEASE 08/15/2010  . CHEST PAIN 08/15/2010  . HEADACHE 12/02/2009  . BACK PAIN 11/22/2009  . CONSTIPATION, CHRONIC 01/29/2008  . NAUSEA 01/28/2008  . DEPRESSION 03/08/2007  . HTN (hypertension) 03/08/2007   Past Medical History:  Diagnosis Date  . AAA (abdominal aortic aneurysm) (HCC)    3.1 cm 07/08/18, 3 year follow-up recommended; possible 3 cm AAA by aortogram 09/13/18  . Anemia    PMH  . Appendicitis with abscess    07/08/18, s/p perc drain; resolved 07/30/18 by CT  . Arthritis   . Bipolar disorder ([place])   . Cerebrovascular disease    intra and extracranial vascular dx per MRI 4/11, neurology rec strict CVRF control  . Colonic inertia   . Constipation    chronic;severe  . Coronary artery disease   . Depression   . Duodenitis   . EKG abnormalities    changes, stress test neg (false EKG changes)  . Gastritis   . GERD (gastroesophageal reflux disease)   . Hypertension   . Hypothyroid 01/16/2014  . Meningioma ([place]) 10/21/2013  . PAD (peripheral artery disease) ([place])   . Psoriasis    sees derm  . Stroke ([place])   . Wears dentures   . Wears glasses    Past Surgical History:  Procedure Laterality Date  . ABDOMINAL AORTOGRAM  W/LOWER EXTREMITY N/A 09/13/2018   Procedure: ABDOMINAL AORTOGRAM W/LOWER EXTREMITY;  Surgeon: Elam Dutch, MD;  Location: [facility];  Service: Cardiovascular;  Laterality: N/A;  . arthroscopy  04/2010   Right knee  . CATARACT EXTRACTION W/ INTRAOCULAR LENS  IMPLANT, BILATERAL    . COLONOSCOPY    . FEMORAL-POPLITEAL BYPASS GRAFT  10/22/2018  . FEMORAL-POPLITEAL BYPASS GRAFT Left 10/22/2018   Procedure: LEFT FEMORAL TO BELOW THE KNEE POPLITEAL ARTERY BYPASS GRAFT;  Surgeon: Elam Dutch, MD;  Location: [facility];  Service: Vascular;  Laterality: Left;  . I&D EXTREMITY Left 11/08/2018   Procedure: IRRIGATION AND DEBRIDEMENT EXTREMITY Left Leg;  Surgeon: Elam Dutch, MD;  Location: [facility];  Service: Vascular;  Laterality: Left;  . IR RADIOLOGIST EVAL & MGMT  07/30/2018  . MULTIPLE TOOTH EXTRACTIONS    . TUBAL LIGATION     No Known Allergies Prior to Admission medications   Medication Sig Start Date End Date Taking? Authorizing Provider  amLODipine (NORVASC) 10 MG tablet Take 1 tablet (10 mg total) by mouth daily. 03/07/19  Yes [name], MD  clonazePAM (KLONOPIN) 0.5 MG tablet TAKE 1 TABLET BY MOUTH AT BEDTIME AS NEEDED FOR ANXIETY 07/28/19  Yes Cottle, Billey Co., MD  DULoxetine (CYMBALTA) 30 MG capsule Take 1 capsule (30 mg total) by mouth every evening. 04/15/19  Yes Cottle, Billey Co., MD  DULoxetine (CYMBALTA) 60 MG capsule TAKE 1 CAPSULE BY MOUTH EVERY DAY 04/15/19  Yes Cottle, Billey Co., MD  famotidine (PEPCID) 20 MG tablet One at bedtime 02/14/16  Yes Tanda Rockers, MD  lamoTRIgine (LAMICTAL) 150 MG tablet TAKE 1 TABLET(150 MG) BY MOUTH TWICE DAILY 05/23/19  Yes Cottle, Billey Co., MD  linaclotide [facility]) 145 MCG CAPS capsule Take 1 capsule (145 mcg total) by mouth daily before breakfast. 05/14/19  Yes Willia Craze, NP  losartan (COZAAR) 50 MG tablet Take 1 tablet (50 mg total) by mouth daily. 03/07/19  Yes [name], MD  polyethylene glycol powder  (GLYCOLAX/MIRALAX) powder Take 17 g by mouth every other day.   Yes [provider]  Probiotic Product (PROBIOTIC-10 ULTIMATE PO) Take by mouth.   Yes [provider]  rOPINIRole (REQUIP) 2 MG tablet TAKE 1 AND 1/2 TABLETS(3 MG) BY MOUTH AT BEDTIME 07/28/19  Yes Cottle, Billey Co., MD   Social History   Socioeconomic History  . Marital status: Widowed    Spouse name: Not on file  . Number of children: 1  . Years of education: Not on file  . Highest education level: Not on file  Occupational History  . Occupation: retired  Scientific laboratory technician  . Financial resource strain: Not hard at all  . Food insecurity    Worry: Patient refused    Inability: Patient refused  . Transportation needs    Medical: Patient refused    Non-medical: Patient refused  Tobacco Use  . Smoking status: Current Every Day Smoker    Packs/day: 0.75    Years: 60.00    Pack years: 45.00    Types: Cigarettes  . Smokeless tobacco: Never Used  Substance and Sexual Activity  . Alcohol use: Yes    Alcohol/week: 0.0 standard drinks    Comment: yes on occassion  . Drug use: No  . Sexual activity: Not Currently  Lifestyle  . Physical activity    Days per week: Patient refused    Minutes per session: Patient refused  . Stress: Not on file  Relationships  . Social Herbalist on phone: Patient refused    Gets together: Patient refused    Attends religious service: Patient refused    Active member of club or organization: Patient refused    Attends meetings of clubs or organizations: Patient refused    Relationship status: Patient refused  . Intimate partner violence    Fear of current or ex partner: Patient refused    Emotionally abused: Patient refused    Physically abused: Patient refused    Forced sexual activity: Patient refused  Other Topics Concern  . Not on file  Social History Narrative   Brother in law Mr Rexford Maus (one of my patients)   Lives w/ husband          Observations/Objective: Normal speech, no distress.  Appropriate responses, understanding of plan expressed as well as ER precautions that were given.  Assessment and Plan: Essential hypertension  -Restart home meds after interval off.  Advised to monitor home readings and advise in next 2 weeks.  PVD (peripheral vascular disease) ([place])  -Ongoing follow-up with vascular surgery.  Nausea and vomiting, intractability of vomiting not specified, unspecified vomiting type - Plan: Ambulatory referral to  Gastroenterology Diarrhea, unspecified type  -Intermittent symptoms, afebrile.  Ongoing symptoms without apparent recent acute worsening.  Urgent referral placed to discuss with her gastroenterologist, may need an office visit/testing.  Small sips of fluids frequently discussed.  ER precautions discussed as below.  Major depressive disorder, remission status unspecified, unspecified whether recurrent  -Likely worsening symptoms recently with decreased dosage of medication.  Has ongoing close follow-up with psychiatry, and therapist.  ER precautions have been discussed if any suicidal ideation returns.  Denies active SI, intent, plan.  Follow Up Instructions:  2 weeks.   Patient Instructions  Try to check your blood pressure at least once per day for right now.  Especially with restarting your medications.  I would like you to follow-up with gastroenterology, will try to see if we can get you set up with them to talk about the nausea, vomiting, and diarrhea.  Small sips of fruit with fluids frequently when you have a nausea to make sure you are staying hydrated, but if you are having any increased abdominal pain fevers or acute worsening of symptoms go to the emergency room.   I would recommend discussing your current antidepressant medications with your psychiatrist as lowering doses may have worsened your symptoms.  Follow up with me in 2 weeks to review other concerns/medical history  including the rheumatology, pulmonary work-up.  We can also discuss sciatica at that time but if any acute worsening symptoms please proceed to the emergency room.  Thank you for taking my call today and take care.  We will talk further in a couple weeks.  Return to the clinic or go to the nearest emergency room if any of your symptoms worsen or new symptoms occur.   Diarrhea, Adult Diarrhea is frequent loose and watery bowel movements. Diarrhea can make you feel weak and cause you to become dehydrated. Dehydration can make you tired and thirsty, cause you to have a dry mouth, and decrease how often you urinate. Diarrhea typically lasts 2-3 days. However, it can last longer if it is a sign of something more serious. It is important to treat your diarrhea as told by your health care provider. Follow these instructions at home: Eating and drinking     Follow these recommendations as told by your health care provider:  Take an oral rehydration solution (ORS). This is an over-the-counter medicine that helps return your body to its normal balance of nutrients and water. It is found at pharmacies and retail stores.  Drink plenty of fluids, such as water, ice chips, diluted fruit juice, and low-calorie sports drinks. You can drink milk also, if desired.  Avoid drinking fluids that contain a lot of sugar or caffeine, such as energy drinks, sports drinks, and soda.  Eat bland, easy-to-digest foods in small amounts as you are able. These foods include bananas, applesauce, rice, lean meats, toast, and crackers.  Avoid alcohol.  Avoid spicy or fatty foods.  Medicines  Take over-the-counter and prescription medicines only as told by your health care provider.  If you were prescribed an antibiotic medicine, take it as told by your health care provider. Do not stop using the antibiotic even if you start to feel better. General instructions   Wash your hands often using soap and water. If soap  and water are not available, use a hand sanitizer. Others in the household should wash their hands as well. Hands should be washed: ? After using the toilet or changing a diaper. ? Before preparing, cooking, or  serving food. ? While caring for a sick person or while visiting someone in a hospital.  Drink enough fluid to keep your urine pale yellow.  Rest at home while you recover.  Watch your condition for any changes.  Take a warm bath to relieve any burning or pain from frequent diarrhea episodes.  Keep all follow-up visits as told by your health care provider. This is important. Contact a health care provider if:  You have a fever.  Your diarrhea gets worse.  You have new symptoms.  You cannot keep fluids down.  You feel light-headed or dizzy.  You have a headache.  You have muscle cramps. Get help right away if:  You have chest pain.  You feel extremely weak or you faint.  You have bloody or black stools or stools that look like tar.  You have severe pain, cramping, or bloating in your abdomen.  You have trouble breathing or you are breathing very quickly.  Your heart is beating very quickly.  Your skin feels cold and clammy.  You feel confused.  You have signs of dehydration, such as: ? Dark urine, very little urine, or no urine. ? Cracked lips. ? Dry mouth. ? Sunken eyes. ? Sleepiness. ? Weakness. Summary  Diarrhea is frequent loose and watery bowel movements. Diarrhea can make you feel weak and cause you to become dehydrated.  Drink enough fluids to keep your urine pale yellow.  Make sure that you wash your hands after using the toilet. If soap and water are not available, use hand sanitizer.  Contact a health care provider if your diarrhea gets worse or you have new symptoms.  Get help right away if you have signs of dehydration. This information is not intended to replace advice given to you by your health care provider. Make sure you discuss  any questions you have with your health care provider. Document Released: 08/18/2002 Document Revised: 02/01/2018 Document Reviewed: 02/01/2018 Elsevier Patient Education  [place].  Nausea and Vomiting, Adult Nausea is feeling sick to your stomach or feeling that you are about to throw up (vomit). Vomiting is when food in your stomach is thrown up and out of the mouth. Throwing up can make you feel weak. It can also make you lose too much water in your body (get dehydrated). If you lose too much water in your body, you may:  Feel tired.  Feel thirsty.  Have a dry mouth.  Have cracked lips.  Go pee (urinate) less often. Older adults and people with other diseases or a weak body defense system (immune system) are at higher risk for losing too much water in the body. If you feel sick to your stomach and you throw up, it is important to follow instructions from your doctor about how to take care of yourself. Follow these instructions at home: Watch your symptoms for any changes. Tell your doctor about them. Follow these instructions to care for yourself at home. Eating and drinking      Take an ORS (oral rehydration solution). This is a drink that is sold at pharmacies and stores.  Drink clear fluids in small amounts as you are able, such as: ? Water. ? Ice chips. ? Fruit juice that has water added (diluted fruit juice). ? Low-calorie sports drinks.  Eat bland, easy-to-digest foods in small amounts as you are able, such as: ? Bananas. ? Applesauce. ? Rice. ? Low-fat (lean) meats. ? Toast. ? Crackers.  Avoid drinking fluids that  have a lot of sugar or caffeine in them. This includes energy drinks, sports drinks, and soda.  Avoid alcohol.  Avoid spicy or fatty foods. General instructions  Take over-the-counter and prescription medicines only as told by your doctor.  Drink enough fluid to keep your pee (urine) pale yellow.  Wash your hands often with soap and  water. If you cannot use soap and water, use hand sanitizer.  Make sure that all people in your home wash their hands well and often.  Rest at home while you get better.  Watch your condition for any changes.  Take slow and deep breaths when you feel sick to your stomach.  Keep all follow-up visits as told by your doctor. This is important. Contact a doctor if:  Your symptoms get worse.  You have new symptoms.  You have a fever.  You cannot drink fluids without throwing up.  You feel sick to your stomach for more than 2 days.  You feel light-headed or dizzy.  You have a headache.  You have muscle cramps.  You have a rash.  You have pain while peeing. Get help right away if:  You have pain in your chest, neck, arm, or jaw.  You feel very weak or you pass out (faint).  You throw up again and again.  You have throw up that is bright red or looks like black coffee grounds.  You have bloody or black poop (stools) or poop that looks like tar.  You have a very bad headache, a stiff neck, or both.  You have very bad pain, cramping, or bloating in your belly (abdomen).  You have trouble breathing.  You are breathing very quickly.  Your heart is beating very quickly.  Your skin feels cold and clammy.  You feel confused.  You have signs of losing too much water in your body, such as: ? Dark pee, very little pee, or no pee. ? Cracked lips. ? Dry mouth. ? Sunken eyes. ? Sleepiness. ? Weakness. These symptoms may be an emergency. Do not wait to see if the symptoms will go away. Get medical help right away. Call your local emergency services (911 in the [place] Do not drive yourself to the hospital. Summary  Nausea is feeling sick to your stomach or feeling that you are about to throw up (vomit). Vomiting is when food in your stomach is thrown up and out of the mouth.  Follow instructions from your doctor about eating and drinking to keep from losing too much  water in your body.  Take over-the-counter and prescription medicines only as told by your doctor.  Contact your doctor if your symptoms get worse or you have new symptoms.  Keep all follow-up visits as told by your doctor. This is important. This information is not intended to replace advice given to you by your health care provider. Make sure you discuss any questions you have with your health care provider. Document Released: 02/14/2008 Document Revised: 12/20/2018 Document Reviewed: 02/05/2018 Elsevier Patient Education  2020 [employer].       I discussed the assessment and treatment plan with the patient. The patient was provided an opportunity to ask questions and all were answered. The patient agreed with the plan and demonstrated an understanding of the instructions.   The patient was advised to call back or seek an in-person evaluation if the symptoms worsen or if the condition fails to improve as anticipated.  I provided 18 minutes of non-face-to-face time during  this encounter, over 10 min chart review.   Signed,   Merri Ray, MD [facility].  08/01/19

## 2019-08-18 ENCOUNTER — Ambulatory Visit (INDEPENDENT_AMBULATORY_CARE_PROVIDER_SITE_OTHER): Payer: Medicare Other | Admitting: Family Medicine

## 2019-08-18 ENCOUNTER — Ambulatory Visit (INDEPENDENT_AMBULATORY_CARE_PROVIDER_SITE_OTHER): Payer: Medicare Other

## 2019-08-18 ENCOUNTER — Other Ambulatory Visit: Payer: Self-pay

## 2019-08-18 ENCOUNTER — Encounter: Payer: Self-pay | Admitting: Family Medicine

## 2019-08-18 VITALS — BP 170/80 | HR 69 | Temp 97.8°F

## 2019-08-18 DIAGNOSIS — R1084 Generalized abdominal pain: Secondary | ICD-10-CM | POA: Diagnosis not present

## 2019-08-18 DIAGNOSIS — R5383 Other fatigue: Secondary | ICD-10-CM

## 2019-08-18 DIAGNOSIS — I739 Peripheral vascular disease, unspecified: Secondary | ICD-10-CM

## 2019-08-18 DIAGNOSIS — J849 Interstitial pulmonary disease, unspecified: Secondary | ICD-10-CM

## 2019-08-18 DIAGNOSIS — I779 Disorder of arteries and arterioles, unspecified: Secondary | ICD-10-CM | POA: Diagnosis not present

## 2019-08-18 DIAGNOSIS — R112 Nausea with vomiting, unspecified: Secondary | ICD-10-CM

## 2019-08-18 DIAGNOSIS — R05 Cough: Secondary | ICD-10-CM | POA: Diagnosis not present

## 2019-08-18 DIAGNOSIS — L819 Disorder of pigmentation, unspecified: Secondary | ICD-10-CM

## 2019-08-18 DIAGNOSIS — R059 Cough, unspecified: Secondary | ICD-10-CM

## 2019-08-18 LAB — POCT CBC
Granulocyte percent: 66.5 %G (ref 37–80)
HCT, POC: 41.2 % — AB (ref 29–41)
Hemoglobin: 14 g/dL (ref 11–14.6)
Lymph, poc: 2.2 (ref 0.6–3.4)
MCH, POC: 29.3 pg (ref 27–31.2)
MCHC: 33.9 g/dL (ref 31.8–35.4)
MCV: 86.4 fL (ref 76–111)
MID (cbc): 0.5 (ref 0–0.9)
MPV: 6.2 fL (ref 0–99.8)
POC Granulocyte: 5.5 (ref 2–6.9)
POC LYMPH PERCENT: 27.4 %L (ref 10–50)
POC MID %: 6.1 %M (ref 0–12)
Platelet Count, POC: 270 10*3/uL (ref 142–424)
RBC: 4.77 M/uL (ref 4.04–5.48)
RDW, POC: 14.8 %
WBC: 8.2 10*3/uL (ref 4.6–10.2)

## 2019-08-18 LAB — GLUCOSE, POCT (MANUAL RESULT ENTRY): POC Glucose: 89 mg/dl (ref 70–99)

## 2019-08-18 NOTE — Progress Notes (Signed)
Subjective:  Patient ID: [name], female    DOB: 27-Apr-1946  Age: 72 y.o. MRN: [id number]  CC:  Chief Complaint  Patient presents with  . Fatigue    f/u   . Dizziness    HPI [name] presents for multiple concerns today.   Fatigue Telemedicine visit November 20.  Had been meeting with psychiatrist and counselor, some concern regarding compliance with medications and ongoing medical issues.  Some concern previously for hypoglycemia, need for regular meals were discussed.  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 Did meet with rheum - not sure of plan.  Pulm- Dr. Gala Murdoch. Unknown follow up plan.  Nausea At telephone visit she noticed more nausea the past month or 2, diarrhea few per day.  No bloody stools.  Vomiting few weeks prior.  Had been treated for IBS with Linzess in the past but has been off for 2 months.  She is also decreased her other meds in half due to nausea including her Cymbalta and Lamictal.  Had been off her losartan and amlodipine for 2 months but had restarted day prior to our telemedicine visit.  I did recommend she discuss the symptoms with her gastroenterologist.  Small sips of fluids frequently discussed with any nausea or vomiting, ER precautions were given.  No vomiting past few weeks. Taking 1/2 of Cymbalta and lamictal d/t nausea.   Leg pains with back pain.  Taking alleve - 2 per day and 2 at bedtime.  No recent tylenol.   Blisters on top of toes on left foot past 2 weeks. Looking a little better  - drying up a bit. Has treated with hydrogen peroxide.   Received call form GI - has not scheduled appt.  Still daily nausea for past month-6 weeks. abd pain comes and goes. Diarrhea comes and goes. Diarrhea few times per day when occurs. No known blood in stools. Not daily diarrhea.   No fever.  Stomach pains last felt 4-5 days ago.  Some lightheadedness at times. No syncope. Increased weakness yesterday.  No urinary symptoms - urinating normally.   Ongoing follow-up with psychiatry and counseling.  Denied active suicidal ideation/intent, or plan on our visit. Last talked to therapist few weeks ago - plan to meet with him as soon as possible.  Has called hotline in past with SI thoughts, and that has helped. Would call a friend or hotline if suicidal.   Hypertension: Restart amlodipine, losartan as above approximately November 18. Has forgotten to take meds at times - taking about 4 times per week. Has not taken meds today.  Has pill minder, but not using currently - thinking about restarting use.   BP Readings from Last 3 Encounters:  08/18/19 (!) 170/80  04/28/19 110/82  03/31/19 131/72   Lab Results  Component Value Date   CREATININE 0.92 08/18/2019   Wants covid test - has some cough, longstanding - no recent change in symptoms. Fatigue - for some time but worsening.  No fever, no new shortness of breath - chronic with ILD above.  No known contact with Covid 19.     History Patient Active Problem List   Diagnosis Date Noted  . Wound infection 11/07/2018  . Postoperative pain   . Sleep disturbance   . Tobacco abuse   . Acute blood loss anemia   . Hypoalbuminemia  due to protein-calorie malnutrition ([place])   . Debility 10/24/2018  . Pre-operative cardiovascular examination 09/26/2018  . HLD (hyperlipidemia) 09/26/2018  . GAD (generalized anxiety disorder) 08/07/2018  . Major depressive disorder, single episode 08/07/2018  . Appendiceal abscess 07/08/2018  . PVD (peripheral vascular disease) ([place]) 03/15/2018  . Atherosclerosis of native artery of left lower extremity with intermittent claudication ([place]) 03/15/2018  . Chronic left-sided low back pain with left-sided sciatica 12/25/2017  . Facial droop   . History of CVA (cerebrovascular accident)  10/23/2017  . Critical lower limb ischemia 11/08/2016  . Smoker 11/08/2016  . Postinflammatory pulmonary fibrosis ([place]) 02/14/2016  . Cigarette smoker 12/24/2015  . Hypothyroid ? 01/16/2014  . Mild cognitive impairment 01/16/2014  . Meningioma ([place]) 10/21/2013  . Chest pain 10/20/2013  . Medicare annual wellness visit, subsequent 06/16/2013  . Dizziness and giddiness 06/16/2013  . RLS (restless legs syndrome) 04/29/2012  . Abdominal pain 12/27/2010  . DEGENERATIVE JOINT DISEASE 09/23/2010  . CAROTID ARTERY DISEASE 08/15/2010  . CHEST PAIN 08/15/2010  . HEADACHE 12/02/2009  . BACK PAIN 11/22/2009  . CONSTIPATION, CHRONIC 01/29/2008  . NAUSEA 01/28/2008  . DEPRESSION 03/08/2007  . HTN (hypertension) 03/08/2007   Past Medical History:  Diagnosis Date  . AAA (abdominal aortic aneurysm) (HCC)    3.1 cm 07/08/18, 3 year follow-up recommended; possible 3 cm AAA by aortogram 09/13/18  . Anemia    PMH  . Appendicitis with abscess    07/08/18, s/p perc drain; resolved 07/30/18 by CT  . Arthritis   . Bipolar disorder ([place])   . Cerebrovascular disease    intra and extracranial vascular dx per MRI 4/11, neurology rec strict CVRF control  . Colonic inertia   . Constipation    chronic;severe  . Coronary artery disease   . Depression   . Duodenitis   . EKG abnormalities    changes, stress test neg (false EKG changes)  . Gastritis   . GERD (gastroesophageal reflux disease)   . Hypertension   . Hypothyroid 01/16/2014  . Meningioma ([place]) 10/21/2013  . PAD (peripheral artery disease) ([place])   . Psoriasis    sees derm  . Stroke ([place])   . Wears dentures   . Wears glasses    Past Surgical History:  Procedure Laterality Date  . ABDOMINAL AORTOGRAM W/LOWER EXTREMITY N/A 09/13/2018   Procedure: ABDOMINAL AORTOGRAM W/LOWER EXTREMITY;  Surgeon: Elam Dutch, MD;  Location: [facility];  Service: Cardiovascular;  Laterality: N/A;  . arthroscopy  04/2010   Right knee  . CATARACT  EXTRACTION W/ INTRAOCULAR LENS  IMPLANT, BILATERAL    . COLONOSCOPY    . FEMORAL-POPLITEAL BYPASS GRAFT  10/22/2018  . FEMORAL-POPLITEAL BYPASS GRAFT Left 10/22/2018   Procedure: LEFT FEMORAL TO BELOW THE KNEE POPLITEAL ARTERY BYPASS GRAFT;  Surgeon: Elam Dutch, MD;  Location: [facility];  Service: Vascular;  Laterality: Left;  . I&D EXTREMITY Left 11/08/2018   Procedure: IRRIGATION AND DEBRIDEMENT EXTREMITY Left Leg;  Surgeon: Elam Dutch, MD;  Location: [facility];  Service: Vascular;  Laterality: Left;  . IR RADIOLOGIST EVAL & MGMT  07/30/2018  . MULTIPLE TOOTH EXTRACTIONS    . TUBAL LIGATION     No Known Allergies Prior to Admission medications   Medication Sig Start Date End Date Taking? Authorizing Provider  amLODipine (NORVASC) 10 MG tablet Take 1 tablet (10 mg total) by mouth daily. 03/07/19  Yes [name], MD  clonazePAM (KLONOPIN) 0.5 MG tablet TAKE 1 TABLET BY  MOUTH AT BEDTIME AS NEEDED FOR ANXIETY 07/28/19  Yes Cottle, Billey Co., MD  DULoxetine (CYMBALTA) 30 MG capsule Take 1 capsule (30 mg total) by mouth every evening. 04/15/19  Yes Cottle, Billey Co., MD  DULoxetine (CYMBALTA) 60 MG capsule TAKE 1 CAPSULE BY MOUTH EVERY DAY 04/15/19  Yes Cottle, Billey Co., MD  famotidine (PEPCID) 20 MG tablet One at bedtime 02/14/16  Yes Tanda Rockers, MD  lamoTRIgine (LAMICTAL) 150 MG tablet TAKE 1 TABLET(150 MG) BY MOUTH TWICE DAILY 05/23/19  Yes Cottle, Billey Co., MD  losartan (COZAAR) 50 MG tablet Take 1 tablet (50 mg total) by mouth daily. 03/07/19  Yes [name], MD  rOPINIRole (REQUIP) 2 MG tablet TAKE 1 AND 1/2 TABLETS(3 MG) BY MOUTH AT BEDTIME 07/28/19  Yes Cottle, Billey Co., MD  linaclotide [facility]) 145 MCG CAPS capsule Take 1 capsule (145 mcg total) by mouth daily before breakfast. Patient not taking: Reported on 08/18/2019 05/14/19   Willia Craze, NP  polyethylene glycol powder (GLYCOLAX/MIRALAX) powder Take 17 g by mouth every other day.    [provider]  Probiotic Product (PROBIOTIC-10 ULTIMATE PO) Take by mouth.    [provider]   Social History   Socioeconomic History  . Marital status: Widowed    Spouse name: Not on file  . Number of children: 1  . Years of education: Not on file  . Highest education level: Not on file  Occupational History  . Occupation: retired  Scientific laboratory technician  . Financial resource strain: Not hard at all  . Food insecurity    Worry: Patient refused    Inability: Patient refused  . Transportation needs    Medical: Patient refused    Non-medical: Patient refused  Tobacco Use  . Smoking status: Current Every Day Smoker    Packs/day: 0.75    Years: 60.00    Pack years: 45.00    Types: Cigarettes  . Smokeless tobacco: Never Used  Substance and Sexual Activity  . Alcohol use: Yes    Alcohol/week: 0.0 standard drinks    Comment: yes on occassion  . Drug use: No  . Sexual activity: Not Currently  Lifestyle  . Physical activity    Days per week: Patient refused    Minutes per session: Patient refused  . Stress: Not on file  Relationships  . Social Herbalist on phone: Patient refused    Gets together: Patient refused    Attends religious service: Patient refused    Active member of club or organization: Patient refused    Attends meetings of clubs or organizations: Patient refused    Relationship status: Patient refused  . Intimate partner violence    Fear of current or ex partner: Patient refused    Emotionally abused: Patient refused    Physically abused: Patient refused    Forced sexual activity: Patient refused  Other Topics Concern  . Not on file  Social History Narrative   Brother in law Mr Rexford Maus (one of my patients)   Lives w/ husband        Review of Systems Per HPI.   Objective:   Vitals:   08/18/19 1609 08/18/19 1619  BP: (!) 180/80 (!) 170/80  Pulse: 69   Temp: 97.8 F (36.6 C)   TempSrc: Temporal   SpO2: 96%      Physical  Exam Vitals signs reviewed.  Constitutional:      Appearance: She  is well-developed.  HENT:     Head: Normocephalic and atraumatic.  Eyes:     Conjunctiva/sclera: Conjunctivae normal.     Pupils: Pupils are equal, round, and reactive to light.  Neck:     Vascular: No carotid bruit.  Cardiovascular:     Rate and Rhythm: Normal rate and regular rhythm.     Heart sounds: Normal heart sounds.  Pulmonary:     Effort: Pulmonary effort is normal.     Breath sounds: Rhonchi (coarse bs distally diffuse. normal effort, no distress. ) present.  Abdominal:     Palpations: Abdomen is soft. There is no pulsatile mass.     Tenderness: There is no abdominal tenderness.  Skin:    General: Skin is warm and dry.       Neurological:     Mental Status: She is alert and oriented to person, place, and time.  Psychiatric:        Behavior: Behavior normal.     Results for orders placed or performed in visit on 08/18/19  Comprehensive metabolic panel  Result Value Ref Range   Glucose 97 65 - 99 mg/dL   BUN 17 8 - 27 mg/dL   Creatinine, Ser 0.92 0.57 - 1.00 mg/dL   GFR calc non Af Amer 61 >59 mL/min/1.73   GFR calc Af Amer 70 >59 mL/min/1.73   BUN/Creatinine Ratio 18 12 - 28   Sodium 138 134 - 144 mmol/L   Potassium 4.4 3.5 - 5.2 mmol/L   Chloride 100 96 - 106 mmol/L   CO2 21 20 - 29 mmol/L   Calcium 9.6 8.7 - 10.3 mg/dL   Total Protein 7.5 6.0 - 8.5 g/dL   Albumin 4.3 3.7 - 4.7 g/dL   Globulin, Total 3.2 1.5 - 4.5 g/dL   Albumin/Globulin Ratio 1.3 1.2 - 2.2   Bilirubin Total 0.3 0.0 - 1.2 mg/dL   Alkaline Phosphatase 81 39 - 117 IU/L   AST 21 0 - 40 IU/L   ALT 16 0 - 32 IU/L  TSH  Result Value Ref Range   TSH 3.550 0.450 - 4.500 uIU/mL  POCT CBC  Result Value Ref Range   WBC 8.2 4.6 - 10.2 K/uL   Lymph, poc 2.2 0.6 - 3.4   POC LYMPH PERCENT 27.4 10 - 50 %L   MID (cbc) 0.5 0 - 0.9   POC MID % 6.1 0 - 12 %M   POC Granulocyte 5.5 2 - 6.9   Granulocyte percent 66.5 37 - 80 %G   RBC  4.77 4.04 - 5.48 M/uL   Hemoglobin 14.0 11 - 14.6 g/dL   HCT, POC 41.2 (A) 29 - 41 %   MCV 86.4 76 - 111 fL   MCH, POC 29.3 27 - 31.2 pg   MCHC 33.9 31.8 - 35.4 g/dL   RDW, POC 14.8 %   Platelet Count, POC 270 142 - 424 K/uL   MPV 6.2 0 - 99.8 fL  POCT glucose (manual entry)  Result Value Ref Range   POC Glucose 89 70 - 99 mg/dl   Dg Abd Acute W/chest  Result Date: 08/19/2019 CLINICAL DATA:  Nausea and vomiting. Generalized abdominal pain. EXAM: DG ABDOMEN ACUTE W/ 1V CHEST COMPARISON:  Radiographs dated 11/06/2018 and CT scan of the abdomen dated 11/07/2018 FINDINGS: There is extensive air throughout the nondistended large and small bowel. No free air or appreciable free fluid. Chronic interstitial disease at both lung bases. Aortic atherosclerosis. No acute infiltrates. No effusions.  No acute bone abnormality. Extensive degenerative changes in the lumbar spine. IMPRESSION: 1. Extensive air throughout the nondistended large and small bowel. No free air. 2. Chronic interstitial disease at the lung bases. 3.  Aortic Atherosclerosis (ICD10-I70.0). Electronically Signed   By: Lorriane Shire M.D.   On: 08/19/2019 08:39      Assessment & Plan:  [name] is a 73 y.o. female . PVD (peripheral vascular disease) ([place]) - Plan: Ambulatory referral to Vascular Surgery Discoloration of skin of toe - Plan: Ambulatory referral to Vascular Surgery  -Concern for possible recurrence of PVD.  Urgent referral to vascular surgery planned.  Nausea and vomiting, intractability of vomiting not specified, unspecified vomiting type - Plan: DG Abd Acute W/Chest Generalized abdominal pain - Plan: DG Abd Acute W/Chest  -Improved.  Increased gas noted without true small bowel obstruction signs.  Stressed importance of gastroenterology follow-up.  ER precautions  Fatigue, unspecified type - Plan: POCT CBC, POCT glucose (manual entry), Comprehensive metabolic panel, TSH ILD (interstitial lung disease) (HCC)  Cough - Plan: Novel Coronavirus, NAA ([facility])  -Fatigue may be multifactorial with depression, intermittent/reduced dosing of medication, and interstitial lung disease.  COVID-19 testing was obtained which was negative.  Other labs as above.  Alternate blood pressure and pill minder discussed for medication adherence.  Depending on work-up of above, may need closer follow-up with pulmonologist.  No orders of the defined types were placed in this encounter.  Patient Instructions    Continue blood pressure medications, please use a pill monitor to remind yourself to take those daily.  We will recheck your blood pressure on medication next week.  Tylenol is safer than Aleve for aches and pains.  Try to use that first.  I will check some lab work for nausea and vomiting, abdominal pain and diarrhea but please call gastroenterologist for follow-up as I referred you last visit.  If any acute worsening of symptoms call 911 or go to the emergency room.  I will need to clarify with your pulmonologist next step for your lungs and cough.  I placed a referral to your vascular specialist for the discoloration of your toes.  You should be hearing from them in the next few days.  If any worsening go straight to the emergency room for evaluation.  Return to the clinic or go to the nearest emergency room if any of your symptoms worsen or new symptoms occur.    If you have lab work done today you will be contacted with your lab results within the next 2 weeks.  If you have not heard from [place] then please contact us. The fastest way to get your results is to register for My Chart.   IF you received an x-ray today, you will receive an invoice from [facility]. Please contact [facility] at [phone] with questions or concerns regarding your invoice.   IF you received labwork today, you will receive an invoice from [facility]. Please contact [facility] at [phone] with questions or  concerns regarding your invoice.   Our billing staff will not be able to assist you with questions regarding bills from these companies.  You will be contacted with the lab results as soon as they are available. The fastest way to get your results is to activate your My Chart account. Instructions are located on the last page of this paperwork. If you have not heard from [place] regarding the results in 2 weeks, please contact this office.  Signed, Merri Ray, MD [facility]

## 2019-08-18 NOTE — Patient Instructions (Addendum)
  Continue blood pressure medications, please use a pill monitor to remind yourself to take those daily.  We will recheck your blood pressure on medication next week.  Tylenol is safer than Aleve for aches and pains.  Try to use that first.  I will check some lab work for nausea and vomiting, abdominal pain and diarrhea but please call gastroenterologist for follow-up as I referred you last visit.  If any acute worsening of symptoms call 911 or go to the emergency room.  I will need to clarify with your pulmonologist next step for your lungs and cough.  I placed a referral to your vascular specialist for the discoloration of your toes.  You should be hearing from them in the next few days.  If any worsening go straight to the emergency room for evaluation.  Return to the clinic or go to the nearest emergency room if any of your symptoms worsen or new symptoms occur.    If you have lab work done today you will be contacted with your lab results within the next 2 weeks.  If you have not heard from [place] then please contact us. The fastest way to get your results is to register for My Chart.   IF you received an x-ray today, you will receive an invoice from [facility]. Please contact [facility] at [phone] with questions or concerns regarding your invoice.   IF you received labwork today, you will receive an invoice from [place]. Please contact [facility] at [phone] with questions or concerns regarding your invoice.   Our billing staff will not be able to assist you with questions regarding bills from these companies.  You will be contacted with the lab results as soon as they are available. The fastest way to get your results is to activate your My Chart account. Instructions are located on the last page of this paperwork. If you have not heard from [place] regarding the results in 2 weeks, please contact this office.

## 2019-08-19 LAB — COMPREHENSIVE METABOLIC PANEL
ALT: 16 IU/L (ref 0–32)
AST: 21 IU/L (ref 0–40)
Albumin/Globulin Ratio: 1.3 (ref 1.2–2.2)
Albumin: 4.3 g/dL (ref 3.7–4.7)
Alkaline Phosphatase: 81 IU/L (ref 39–117)
BUN/Creatinine Ratio: 18 (ref 12–28)
BUN: 17 mg/dL (ref 8–27)
Bilirubin Total: 0.3 mg/dL (ref 0.0–1.2)
CO2: 21 mmol/L (ref 20–29)
Calcium: 9.6 mg/dL (ref 8.7–10.3)
Chloride: 100 mmol/L (ref 96–106)
Creatinine, Ser: 0.92 mg/dL (ref 0.57–1.00)
GFR calc Af Amer: 70 mL/min/{1.73_m2} (ref >59)
GFR calc non Af Amer: 61 mL/min/{1.73_m2} (ref >59)
Globulin, Total: 3.2 g/dL (ref 1.5–4.5)
Glucose: 97 mg/dL (ref 65–99)
Potassium: 4.4 mmol/L (ref 3.5–5.2)
Sodium: 138 mmol/L (ref 134–144)
Total Protein: 7.5 g/dL (ref 6.0–8.5)

## 2019-08-19 LAB — NOVEL CORONAVIRUS, NAA: SARS-CoV-2, NAA: NOT DETECTED

## 2019-08-19 LAB — TSH: TSH: 3.55 u[IU]/mL (ref 0.450–4.500)

## 2019-08-22 ENCOUNTER — Other Ambulatory Visit: Payer: Self-pay

## 2019-08-22 DIAGNOSIS — I779 Disorder of arteries and arterioles, unspecified: Secondary | ICD-10-CM

## 2019-08-25 ENCOUNTER — Other Ambulatory Visit: Payer: Self-pay

## 2019-08-25 ENCOUNTER — Telehealth: Payer: Self-pay

## 2019-08-25 ENCOUNTER — Ambulatory Visit (HOSPITAL_COMMUNITY)
Admission: RE | Admit: 2019-08-25 | Discharge: 2019-08-25 | Disposition: A | Discharge status: Home / Self Care | Payer: Medicare Other | Source: Ambulatory Visit | Attending: Family | Admitting: Family

## 2019-08-25 ENCOUNTER — Ambulatory Visit (INDEPENDENT_AMBULATORY_CARE_PROVIDER_SITE_OTHER)
Admission: RE | Admit: 2019-08-25 | Discharge: 2019-08-25 | Disposition: A | Discharge status: Home / Self Care | Payer: Medicare Other | Source: Ambulatory Visit | Attending: Family | Admitting: Family

## 2019-08-25 DIAGNOSIS — I779 Disorder of arteries and arterioles, unspecified: Secondary | ICD-10-CM | POA: Diagnosis not present

## 2019-08-25 NOTE — Telephone Encounter (Signed)
I spoke with Mrs. Steadman while she was in our building for her vascular ultrasound of her legs.  She wanted to relay some symptoms she was having and request to have an in-person visit instead of the phone visit currently scheduled with Clemon Chambers on Wednesday, December 16.Her symptoms include: leg discomfort since her surgery and, in the last several weeks, on the left side: bluish colored toes with numbness and some blistering; "boil" on the incision site that has opened up; and redness along the incision site.  She does not have and has not had a fever.  Matt Cravey, RVT reports no results in the ultrasound today that would be reported as emergent.  After consulting with Penni Homans, RN Team Leader, it was determined that the patient's appointment could be scheduled the next day with an APP.  The patient elected to wait until Wednesday to see Clemon Chambers, NP

## 2019-08-27 ENCOUNTER — Other Ambulatory Visit: Payer: Self-pay

## 2019-08-27 ENCOUNTER — Encounter: Payer: Self-pay | Admitting: Family

## 2019-08-27 ENCOUNTER — Telehealth: Payer: Self-pay | Admitting: *Deleted

## 2019-08-27 ENCOUNTER — Ambulatory Visit (INDEPENDENT_AMBULATORY_CARE_PROVIDER_SITE_OTHER): Payer: Medicare Other | Admitting: Family

## 2019-08-27 DIAGNOSIS — I779 Disorder of arteries and arterioles, unspecified: Secondary | ICD-10-CM | POA: Diagnosis not present

## 2019-08-27 DIAGNOSIS — Z87891 Personal history of nicotine dependence: Secondary | ICD-10-CM | POA: Diagnosis not present

## 2019-08-27 DIAGNOSIS — R239 Unspecified skin changes: Secondary | ICD-10-CM

## 2019-08-27 DIAGNOSIS — S90425A Blister (nonthermal), left lesser toe(s), initial encounter: Secondary | ICD-10-CM | POA: Diagnosis not present

## 2019-08-27 MED ORDER — CEPHALEXIN 500 MG PO CAPS: 500.0000 mg | ORAL_CAPSULE | Freq: Three times a day (TID) | ORAL | 0 refills | Status: DC

## 2019-08-27 NOTE — Progress Notes (Signed)
Virtual Visit via Telephone Note  I connected with [name] on 08/27/2019 using the D[employer] by telephone and verified that I was speaking with the correct person using two identifiers. Patient was located at her home and accompanied by herself. I am located at the [facility] office[facility]clinic.   The limitations of evaluation and management by telemedicine and the availability of in person appointments have been previously discussed with the patient and are documented in the patients chart. The patient expressed understanding and consented to proceed.  PCP: [name], MD  Chief Complaint: Follow up peripheral artery occlusive disease   History of Present Illness: [name] is a 73 y.o. female who is s/pleft superficial femoral artery to below-knee popliteal artery bypass using non-reversed left greater saphenous veinon 10-22-18 by Dr. Oneida Alar for rest pain in left foot, which then required I&D of left thigh on 11-08-18 by Dr. Oneida Alar for left thigh wound infection.  She reports a 1 month history of blisters on her left toes, 3rd left toe more so; states she had blisters on her right toes prior to this which healed. She also reports that a blister opened on her left leg incision, just distal to her knee, and started draining white material a couple of days ago. She denies fever or chills.  She states that she has been showering with Dove soap. She does not see a podiatrist.   She states that she has known left sciatica, but has had numbness at her left knee and thigh since the I&D of her thigh in February 2020.  She states that she could no come in to see me today due to severe nausea.   She has been walking her dog daily for 30 minutes, carries a portable fold up chair, stops several times.   Her husband passed away in 01-27-19 and she is thinking about moving to [place] where her children live. She may move after February 2021.    Diabetic:No Tobacco [id number] smoker,  quit July 2020, started in her teens  Pt meds include: Statin :no, a statin caused nausea Betablocker:No ASA:Yes Other anticoagulants/antiplatelets:no    Past Medical History:  Diagnosis Date  . AAA (abdominal aortic aneurysm) (HCC)    3.1 cm 07/08/18, 3 year follow-up recommended; possible 3 cm AAA by aortogram 09/13/18  . Anemia    PMH  . Appendicitis with abscess    07/08/18, s/p perc drain; resolved 07/30/18 by CT  . Arthritis   . Bipolar disorder ([place])   . Cerebrovascular disease    intra and extracranial vascular dx per MRI 4/11, neurology rec strict CVRF control  . Colonic inertia   . Constipation    chronic;severe  . Coronary artery disease   . Depression   . Duodenitis   . EKG abnormalities    changes, stress test neg (false EKG changes)  . Gastritis   . GERD (gastroesophageal reflux disease)   . Hypertension   . Hypothyroid 01/16/2014  . Meningioma ([place]) 10/21/2013  . PAD (peripheral artery disease) ([place])   . Psoriasis    sees derm  . Stroke ([place])   . Wears dentures   . Wears glasses     Past Surgical History:  Procedure Laterality Date  . ABDOMINAL AORTOGRAM W/LOWER EXTREMITY N/A 09/13/2018   Procedure: ABDOMINAL AORTOGRAM W/LOWER EXTREMITY;  Surgeon: Elam Dutch, MD;  Location: [facility];  Service: Cardiovascular;  Laterality: N/A;  . arthroscopy  04/2010   Right knee  .  CATARACT EXTRACTION W/ INTRAOCULAR LENS  IMPLANT, BILATERAL    . COLONOSCOPY    . FEMORAL-POPLITEAL BYPASS GRAFT  10/22/2018  . FEMORAL-POPLITEAL BYPASS GRAFT Left 10/22/2018   Procedure: LEFT FEMORAL TO BELOW THE KNEE POPLITEAL ARTERY BYPASS GRAFT;  Surgeon: Elam Dutch, MD;  Location: [facility];  Service: Vascular;  Laterality: Left;  . I&D EXTREMITY Left 11/08/2018   Procedure: IRRIGATION AND DEBRIDEMENT EXTREMITY Left Leg;  Surgeon: Elam Dutch, MD;  Location: [facility];  Service: Vascular;  Laterality: Left;  . IR RADIOLOGIST EVAL & MGMT  07/30/2018  . MULTIPLE  TOOTH EXTRACTIONS    . TUBAL LIGATION      Current Outpatient Medications on File Prior to Visit  Medication Sig Dispense Refill  . amLODipine (NORVASC) 10 MG tablet Take 1 tablet (10 mg total) by mouth daily. 90 tablet 1  . clonazePAM (KLONOPIN) 0.5 MG tablet TAKE 1 TABLET BY MOUTH AT BEDTIME AS NEEDED FOR ANXIETY 30 tablet 1  . DULoxetine (CYMBALTA) 30 MG capsule Take 1 capsule (30 mg total) by mouth every evening. 90 capsule 1  . DULoxetine (CYMBALTA) 60 MG capsule TAKE 1 CAPSULE BY MOUTH EVERY DAY 90 capsule 1  . famotidine (PEPCID) 20 MG tablet One at bedtime 30 tablet 11  . lamoTRIgine (LAMICTAL) 150 MG tablet TAKE 1 TABLET(150 MG) BY MOUTH TWICE DAILY 180 tablet 0  . linaclotide (LINZESS) 145 MCG CAPS capsule Take 1 capsule (145 mcg total) by mouth daily before breakfast. (Patient not taking: Reported on 08/18/2019) 30 capsule 5  . losartan (COZAAR) 50 MG tablet Take 1 tablet (50 mg total) by mouth daily. 90 tablet 1  . polyethylene glycol powder (GLYCOLAX/MIRALAX) powder Take 17 g by mouth every other day.    . Probiotic Product (PROBIOTIC-10 ULTIMATE PO) Take by mouth.    Marland Kitchen rOPINIRole (REQUIP) 2 MG tablet TAKE 1 AND 1/2 TABLETS(3 MG) BY MOUTH AT BEDTIME 135 tablet 0   No current facility-administered medications on file prior to visit.   No Known Allergies    12 system ROS was negative unless otherwise noted in HPI   Observations/Objective:  DATA  Left LE Arterial Duplex (08-25-19): +--------+--------+-----+--------+--------+--------+ LEFT    PSV cm/sRatioStenosisWaveformComments +--------+--------+-----+--------+--------+--------+ SFA Prox51                   biphasic         +--------+--------+-----+--------+--------+--------+  Left Graft #1: Superficial femoral to popliteal +--------------------+--------+--------+--------+--------+                     PSV  cm/sStenosisWaveformComments +--------------------+--------+--------+--------+--------+ Inflow              41              biphasic         +--------------------+--------+--------+--------+--------+ Proximal Anastomosis53              biphasic         +--------------------+--------+--------+--------+--------+ Proximal Graft      54              biphasic         +--------------------+--------+--------+--------+--------+ Mid Graft           71              biphasic         +--------------------+--------+--------+--------+--------+ Distal Graft        72              biphasic         +--------------------+--------+--------+--------+--------+  Distal Anastomosis  74              biphasic         +--------------------+--------+--------+--------+--------+ Outflow             130             biphasic         +--------------------+--------+--------+--------+--------+ Summary: Left: Patent left superficial femoral to popliteal artery bypass graft with no evidence of restenosis.   Left LE Arterial Duplex (05-06-19): Left Graft #1: +--------------------+--------+--------+---------+--------+  PSV cm/sStenosisWaveform Comments +--------------------+--------+--------+---------+--------+ Inflow 68  triphasic  +--------------------+--------+--------+---------+--------+ Proximal Anastomosis42  triphasic  +--------------------+--------+--------+---------+--------+ Proximal Graft 48  triphasic  +--------------------+--------+--------+---------+--------+ Mid Graft 59  triphasic  +--------------------+--------+--------+---------+--------+ Distal Graft 123  triphasic  +--------------------+--------+--------+---------+--------+ Distal Anastomosis 47  biphasic    +--------------------+--------+--------+---------+--------+ Outflow 90  biphasic   +--------------------+--------+--------+---------+--------+ Summary: Left: Patent left superficial femoral artery to below-knee popliteal artery bypass graft without evidence of stenosis.   ABI Findings (08-25-19): +---------+------------------+-----+---------+--------+ Right    Rt Pressure (mmHg)IndexWaveform Comment  +---------+------------------+-----+---------+--------+ Brachial 169                                      +---------+------------------+-----+---------+--------+ ATA      156               0.92                   +---------+------------------+-----+---------+--------+ PTA      160               0.95 triphasic         +---------+------------------+-----+---------+--------+ DP                              biphasic          +---------+------------------+-----+---------+--------+ Great Toe75                0.44                   +---------+------------------+-----+---------+--------+  +---------+------------------+-----+----------+-------+ Left     Lt Pressure (mmHg)IndexWaveform  Comment +---------+------------------+-----+----------+-------+ Brachial 162                                      +---------+------------------+-----+----------+-------+ ATA      157               0.93 monophasic        +---------+------------------+-----+----------+-------+ PTA      142               0.84 triphasic         +---------+------------------+-----+----------+-------+ Great Toe75                0.44                   +---------+------------------+-----+----------+-------+  +-------+-----------+-----------+------------+------------+ ABI/TBIToday's ABIToday's TBIPrevious ABIPrevious TBI +-------+-----------+-----------+------------+------------+ Right  0.95       0.44       1.0          0.52         +-------+-----------+-----------+------------+------------+ Left   0.93       0.44       0.96        0.57         +-------+-----------+-----------+------------+------------+  Right ABIs appear essentially unchanged compared to prior study on 080/25/2020. Left ABIs appear essentially unchanged compared to prior study on 05/06/2019. Summary: Right: Resting right ankle-brachial index is within normal range. No evidence of significant right lower extremity arterial disease. The right toe-brachial index is abnormal. RT great toe pressure = 75 mmHg. Left: Resting left ankle-brachial index indicates mild left lower extremity arterial disease. The left toe-brachial index is abnormal. LT Great toe pressure = 75 mmHg.   ABI Findings (05-06-19): +---------+------------------+-----+---------+--------+ Right Rt Pressure (mmHg)IndexWaveform Comment  +---------+------------------+-----+---------+--------+ Brachial 164  triphasic  +---------+------------------+-----+---------+--------+ PTA 164 1.00 triphasic  +---------+------------------+-----+---------+--------+ DP 140 0.85 biphasic   +---------+------------------+-----+---------+--------+ Great Toe85 0.52 Abnormal   +---------+------------------+-----+---------+--------+  +---------+------------------+-----+----------+-------+ Left Lt Pressure (mmHg)IndexWaveform Comment +---------+------------------+-----+----------+-------+ Brachial 163  triphasic   +---------+------------------+-----+----------+-------+ PTA 158 0.96 triphasic   +---------+------------------+-----+----------+-------+ DP 96 0.59 monophasic  +---------+------------------+-----+----------+-------+ Great Toe94  0.57 Abnormal   +---------+------------------+-----+----------+-------+  +-------+-----------+-----------+------------+------------+ ABI/TBIToday's ABIToday's TBIPrevious ABIPrevious TBI +-------+-----------+-----------+------------+------------+ Right 1.00 0.52 0.93 N/A  +-------+-----------+-----------+------------+------------+ Left 0.96 0.57 0.66 N/A  +-------+-----------+-----------+------------+------------+ Bilateral ABIs appear increased compared to prior study on 10/23/2018. Summary: Right: Resting right ankle-brachial index is within normal range. No evidence of significant right lower extremity arterial disease. The right toe-brachial index is abnormal. RT great toe pressure = 85 mmHg. Left: Resting left ankle-brachial index is within normal range. No evidence of significant left lower extremity arterial disease. The left toe-brachial index is abnormal. LT Great toe pressure = 94 mmHg.    Assessment and Plan: [name] is a 73 y.o. female who is s/pleft superficial femoral artery to below-knee popliteal artery bypass using non-reversed left greater saphenous veinon 10-22-18 by Dr. Oneida Alar for rest pain in left foot, which then required I&D of left thigh on 11-08-18 by Dr. Oneida Alar for left thigh wound infection.  I did not see her today, states she could not leave her home due to severe nausea, this was a phone encounter.  She states that she has a new draining blister on her left thigh incision, just distal to her knee, no fever or chills. Also has "blisters" on her left toes, more so on the 3rd toe; states she had blisters on her right toes recently that healed.   08-25-19 ABI's show no disease in the right leg, bi and triphasic waveforms, and mild disease in the left with tri and monophasic waveforms.  Left leg arterial duplex on 08-25-19 shows a patent left superficial  femoral to popliteal artery bypass graft with no evidence of restenosis, all biphasic waveforms.  She takes a daily ASA, does not take a statin as a statin caused nausea.  She has been walking her dog daily, carries a portable fold up chair, stops several times.    For possible infection of left leg one area that has opened and is draining per report of pt, or prophylaxis to infection: Keflex 500 mg po tid x 10 days, disp #30, 0 refills, sent to her pharmacy.  She is not seeing a podiatrist: referral to [facility] for follow up PAD, and evaluation of blisters at left toes that are unlikely ischemia related.     Follow Up Instructions:   Follow up in 2 days in the office/clinic with me to evaluate draining area on left leg incision and left toes blisters after 2 days on Keflex.    I discussed the assessment and treatment plan with the patient. The patient was provided an opportunity to ask questions and all were answered. The patient agreed with  the plan and demonstrated an understanding of the instructions.   The patient was advised to call back or seek an in-person evaluation if the symptoms worsen or if the condition fails to improve as anticipated.  I spent 14 minutes with the patient via telephone encounter.   Gabrielle Dare Flavius [facility] Office: [phone]  08/27/2019, 1:42 PM

## 2019-08-27 NOTE — Patient Instructions (Signed)
Peripheral Vascular Disease  Peripheral vascular disease (PVD) is a disease of the blood vessels that are not part of your heart and brain. A simple term for PVD is poor circulation. In most cases, PVD narrows the blood vessels that carry blood from your heart to the rest of your body. This can reduce the supply of blood to your arms, legs, and internal organs, like your stomach or kidneys. However, PVD most often affects a person's lower legs and feet. Without treatment, PVD tends to get worse. PVD can also lead to acute ischemic limb. This is when an arm or leg suddenly cannot get enough blood. This is a medical emergency. Follow these instructions at home: Lifestyle  Do not use any products that contain nicotine or tobacco, such as cigarettes and e-cigarettes. If you need help quitting, ask your doctor.  Lose weight if you are overweight. Or, stay at a healthy weight as told by your doctor.  Eat a diet that is low in fat and cholesterol. If you need help, ask your doctor.  Exercise regularly. Ask your doctor for activities that are right for you. General instructions  Take over-the-counter and prescription medicines only as told by your doctor.  Take good care of your feet: ? Wear comfortable shoes that fit well. ? Check your feet often for any cuts or sores.  Keep all follow-up visits as told by your doctor This is important. Contact a doctor if:  You have cramps in your legs when you walk.  You have leg pain when you are at rest.  You have coldness in a leg or foot.  Your skin changes.  You are unable to get or have an erection (erectile dysfunction).  You have cuts or sores on your feet that do not heal. Get help right away if:  Your arm or leg turns cold, numb, and blue.  Your arms or legs become red, warm, swollen, painful, or numb.  You have chest pain.  You have trouble breathing.  You suddenly have weakness in your face, arm, or leg.  You become very  confused or you cannot speak.  You suddenly have a very bad headache.  You suddenly cannot see. Summary  Peripheral vascular disease (PVD) is a disease of the blood vessels.  A simple term for PVD is poor circulation. Without treatment, PVD tends to get worse.  Treatment may include exercise, low fat and low cholesterol diet, and quitting smoking. This information is not intended to replace advice given to you by your health care provider. Make sure you discuss any questions you have with your health care provider. Document Released: 11/22/2009 Document Revised: 08/10/2017 Document Reviewed: 10/05/2016 Elsevier Patient Education  2020 Elsevier Inc.  

## 2019-08-27 NOTE — Telephone Encounter (Signed)
Virtual Visit Pre-Appointment Phone Call  Today, I spoke with [name] and performed the following actions:  1. I explained that we are currently trying to limit exposure to the COVID-19 virus by seeing patients at home rather than in the office.  I explained that the visits are best done by video, but can be done by telephone.  I asked the patient if a virtual visit that the patient would like to try instead of coming into the office. [name] agreed to proceed with the virtual visit scheduled with [name] on 08/27/19.    2. I confirmed the [facility]ST phone number to call the day of the visit and- I included this in appointment notes.  3. I asked if the patient had access to (through a family member/friend) a smartphone with video capability to be used for her visit?"  The patient said yes -    4. I confirmed consent by  a. sending through [place] or by email the [place] as written at the end of this message or  b. verbally as listed below. i. This visit is being performed in the setting of COVID-19. ii. All virtual visits are billed to your insurance company just like a normal visit would be.   iii. We'd like you to understand that the technology does not allow for your provider to perform an examination, and thus may limit your provider's ability to fully assess your condition.  iv. If your provider identifies any concerns that need to be evaluated in person, we will make arrangements to do so.   v. Finally, though the technology is pretty good, we cannot assure that it will always work on either your or our end, and in the setting of a video visit, we may have to convert it to a phone-only visit.  In either situation, we cannot ensure that we have a secure connection.   vi. Are you willing to proceed?"  STAFF: Did the patient verbally acknowledge consent to telehealth visit? Document YES/NO here: YES  2. I advised the patient to be  prepared - I asked that the patient, on the day of her visit, record any information possible with the equipment at her home, such as blood pressure, pulse, oxygen saturation, and your weight and write them all down. I asked the patient to have a pen and paper handy nearby the day of the visit as well.  3. If the patient was scheduled for a video visit, I informed the patient that the visit with the doctor would start with a text to the smartphone # given to [place] by the patient.         If the patient was scheduled for a telephone call, I informed the patient that the visit with the doctor would start with a call to the telephone # given to [place] by the patient.  4. I Informed patient they will receive a phone call 15 minutes prior to their appointment time from a [place] or nurse to review medications, allergies, etc. to prepare for the visit.    TELEPHONE CALL NOTE  ARFA WEISSMAN has been deemed a candidate for a follow-up tele-health visit to limit community exposure during the Covid-19 pandemic. I spoke with the patient via phone to ensure availability of phone/video source, confirm preferred email & phone number, and discuss instructions and expectations.  I reminded ZURIYA ZARZA to be prepared with any vital sign and/or heart  rhythm information that could potentially be obtained via home monitoring, at the time of her visit. I reminded TARANIKA MARRA to expect a phone call prior to her visit.  Cleaster Corin, NT 08/27/2019 12:55 PM     FULL LENGTH CONSENT FOR TELE-HEALTH VISIT   I hereby voluntarily request, consent and authorize [facility] and its employed or contracted physicians, physician assistants, nurse practitioners or other licensed health care professionals (the Practitioner), to provide me with telemedicine health care services (the "Services") as deemed necessary by the treating Practitioner. I acknowledge and consent to receive the Services by the Practitioner via  telemedicine. I understand that the telemedicine visit will involve communicating with the Practitioner through live audiovisual communication technology and the disclosure of certain medical information by electronic transmission. I acknowledge that I have been given the opportunity to request an in-person assessment or other available alternative prior to the telemedicine visit and am voluntarily participating in the telemedicine visit.  I understand that I have the right to withhold or withdraw my consent to the use of telemedicine in the course of my care at any time, without affecting my right to future care or treatment, and that the Practitioner or I may terminate the telemedicine visit at any time. I understand that I have the right to inspect all information obtained and/or recorded in the course of the telemedicine visit and may receive copies of available information for a reasonable fee.  I understand that some of the potential risks of receiving the Services via telemedicine include:  Marland Kitchen Delay or interruption in medical evaluation due to technological equipment failure or disruption; . Information transmitted may not be sufficient (e.g. poor resolution of images) to allow for appropriate medical decision making by the Practitioner; and/or  . In rare instances, security protocols could fail, causing a breach of personal health information.  Furthermore, I acknowledge that it is my responsibility to provide information about my medical history, conditions and care that is complete and accurate to the best of my ability. I acknowledge that Practitioner's advice, recommendations, and/or decision may be based on factors not within their control, such as incomplete or inaccurate data provided by me or distortions of diagnostic images or specimens that may result from electronic transmissions. I understand that the practice of medicine is not an exact science and that Practitioner makes no warranties or  guarantees regarding treatment outcomes. I acknowledge that I will receive a copy of this consent concurrently upon execution via email to the email address I last provided but may also request a printed copy by calling the office of [facility].    I understand that my insurance will be billed for this visit.   I have read or had this consent read to me. . I understand the contents of this consent, which adequately explains the benefits and risks of the Services being provided via telemedicine.  . I have been provided ample opportunity to ask questions regarding this consent and the Services and have had my questions answered to my satisfaction. . I give my informed consent for the services to be provided through the use of telemedicine in my medical care  By participating in this telemedicine visit I agree to the above.

## 2019-08-28 ENCOUNTER — Ambulatory Visit: Payer: Medicare Other | Admitting: Family Medicine

## 2019-08-29 ENCOUNTER — Other Ambulatory Visit: Payer: Self-pay | Admitting: Vascular Surgery

## 2019-08-29 ENCOUNTER — Ambulatory Visit (INDEPENDENT_AMBULATORY_CARE_PROVIDER_SITE_OTHER): Payer: Medicare Other | Admitting: Family

## 2019-08-29 ENCOUNTER — Other Ambulatory Visit: Payer: Self-pay

## 2019-08-29 ENCOUNTER — Encounter: Payer: Self-pay | Admitting: Family

## 2019-08-29 VITALS — BP 141/82 | HR 64 | Temp 97.1°F | Resp 20 | Ht 68.0 in

## 2019-08-29 DIAGNOSIS — Z87891 Personal history of nicotine dependence: Secondary | ICD-10-CM

## 2019-08-29 DIAGNOSIS — R079 Chest pain, unspecified: Secondary | ICD-10-CM

## 2019-08-29 DIAGNOSIS — R239 Unspecified skin changes: Secondary | ICD-10-CM

## 2019-08-29 DIAGNOSIS — R230 Cyanosis: Secondary | ICD-10-CM

## 2019-08-29 DIAGNOSIS — I779 Disorder of arteries and arterioles, unspecified: Secondary | ICD-10-CM | POA: Diagnosis not present

## 2019-08-29 MED ORDER — RIVAROXABAN (XARELTO) VTE STARTER PACK (15 & 20 MG): ORAL_TABLET | ORAL | 0 refills | Status: DC

## 2019-08-29 NOTE — Progress Notes (Signed)
[facility] [place]   CC: Evaluation of dark discoloration of toes of both feet, pain in 3rd left toe, history of peripheral artery occlusive disease  History of Present Illness [name] is a 73 y.o. female who is s/pleft superficial femoral artery to below-knee popliteal artery bypass using non-reversed left greater saphenous veinon 10-22-18 by Dr. Oneida Alar for rest pain in left foot, which then required I&D of left thigh on 11-08-18 by Dr. Oneida Alar for left thigh wound infection.  She reports a 6 weeks history of purple discorlation on her left toes, 3rd left toe more so and pain in left 3rd toe; states she had blisters on her right toes prior to this which healed, but that about 1-2 weeks ago her right toes started having dark discoloration. She also reports that a blister opened on her left leg incision, just distal to her knee, and started draining white material about 4 days ago. She denies fever or chills.  She returns today after 2 days on po Keflex.  She states that she has been showering with Dial soap for the last 2 days She does not see a podiatrist, I gave her a referral to [facility].   She states that she has known left sciatica, but has had numbness at her left knee and thigh since the I&D of her thigh in February 2020.   She has been walking her dog daily for 30 minutes, carries a portable fold up chair, stops several times.   Her husband passed away in 01-22-2019 and she is thinking about moving to [place] where her children live. She may move after February 2021.   She states she has COPD, and that her dyspnea is no worse than usual. She denies chest pain.    Diabetic:No Tobacco [id number], quit July 2020,started in her teens  Pt meds include: Statin :no, a statin caused nausea Betablocker:No ASA:Yes Other anticoagulants/antiplatelets:no   Past Medical History:  Diagnosis Date  . AAA (abdominal aortic  aneurysm) (HCC)    3.1 cm 07/08/18, 3 year follow-up recommended; possible 3 cm AAA by aortogram 09/13/18  . Anemia    PMH  . Appendicitis with abscess    07/08/18, s/p perc drain; resolved 07/30/18 by CT  . Arthritis   . Bipolar disorder ([place])   . Cerebrovascular disease    intra and extracranial vascular dx per MRI 4/11, neurology rec strict CVRF control  . Colonic inertia   . Constipation    chronic;severe  . Coronary artery disease   . Depression   . Duodenitis   . EKG abnormalities    changes, stress test neg (false EKG changes)  . Gastritis   . GERD (gastroesophageal reflux disease)   . Hypertension   . Hypothyroid 01/16/2014  . Meningioma ([place]) 10/21/2013  . PAD (peripheral artery disease) ([place])   . Psoriasis    sees derm  . Stroke ([place])   . Wears dentures   . Wears glasses     Social History Social History   Tobacco Use  . Smoking status: Current Every Day Smoker    Packs/day: 0.75    Years: 60.00    Pack years: 45.00    Types: Cigarettes  . Smokeless tobacco: Never Used  Substance Use Topics  . Alcohol use: Yes    Alcohol/week: 0.0 standard drinks    Comment: yes on occassion  . Drug use: No    Family History Family History  Problem Relation Age of  Onset  . Breast cancer Mother        metastisis to bones  . Diabetes Son   . Heart disease Other        grandfather   . Alcohol abuse Brother   . Heart disease Brother   . Heart disease Maternal Aunt   . Lung cancer Brother        smoked  . Colon cancer Neg Hx     Past Surgical History:  Procedure Laterality Date  . ABDOMINAL AORTOGRAM W/LOWER EXTREMITY N/A 09/13/2018   Procedure: ABDOMINAL AORTOGRAM W/LOWER EXTREMITY;  Surgeon: Elam Dutch, MD;  Location: [facility];  Service: Cardiovascular;  Laterality: N/A;  . arthroscopy  04/2010   Right knee  . CATARACT EXTRACTION W/ INTRAOCULAR LENS  IMPLANT, BILATERAL    . COLONOSCOPY    . FEMORAL-POPLITEAL BYPASS GRAFT  10/22/2018  .  FEMORAL-POPLITEAL BYPASS GRAFT Left 10/22/2018   Procedure: LEFT FEMORAL TO BELOW THE KNEE POPLITEAL ARTERY BYPASS GRAFT;  Surgeon: Elam Dutch, MD;  Location: [facility];  Service: Vascular;  Laterality: Left;  . I & D EXTREMITY Left 11/08/2018   Procedure: IRRIGATION AND DEBRIDEMENT EXTREMITY Left Leg;  Surgeon: Elam Dutch, MD;  Location: [facility];  Service: Vascular;  Laterality: Left;  . IR RADIOLOGIST EVAL & MGMT  07/30/2018  . MULTIPLE TOOTH EXTRACTIONS    . TUBAL LIGATION      No Known Allergies  Current Outpatient Medications  Medication Sig Dispense Refill  . amLODipine (NORVASC) 10 MG tablet Take 1 tablet (10 mg total) by mouth daily. 90 tablet 1  . cephALEXin (KEFLEX) 500 MG capsule Take 1 capsule (500 mg total) by mouth 3 (three) times daily. 30 capsule 0  . clonazePAM (KLONOPIN) 0.5 MG tablet TAKE 1 TABLET BY MOUTH AT BEDTIME AS NEEDED FOR ANXIETY 30 tablet 1  . DULoxetine (CYMBALTA) 30 MG capsule Take 1 capsule (30 mg total) by mouth every evening. 90 capsule 1  . famotidine (PEPCID) 20 MG tablet One at bedtime 30 tablet 11  . lamoTRIgine (LAMICTAL) 150 MG tablet TAKE 1 TABLET(150 MG) BY MOUTH TWICE DAILY 180 tablet 0  . linaclotide (LINZESS) 145 MCG CAPS capsule Take 1 capsule (145 mcg total) by mouth daily before breakfast. 30 capsule 5  . losartan (COZAAR) 50 MG tablet Take 1 tablet (50 mg total) by mouth daily. 90 tablet 1  . polyethylene glycol powder (GLYCOLAX/MIRALAX) powder Take 17 g by mouth every other day.    . Probiotic Product (PROBIOTIC-10 ULTIMATE PO) Take by mouth.    Marland Kitchen rOPINIRole (REQUIP) 2 MG tablet TAKE 1 AND 1/2 TABLETS(3 MG) BY MOUTH AT BEDTIME 135 tablet 0   No current facility-administered medications for this visit.    ROS: See HPI for pertinent positives and negatives.   Physical Examination  Vitals:   08/29/19 1422  BP: (!) 141/82  Pulse: 64  Resp: 20  Temp: (!) 97.1 F (36.2 C)  TempSrc: Temporal  SpO2: 99%  Height: 5\' 8"  (1.727 m)    Body mass index is 24.02 kg/m.  General: A&O x 3, WDWN, female. Gait: limp HEENT: No gross abnormalities.  Pulmonary: Respirations are non labored, CTAB, good air movement in all fields Cardiac: regular rhythm, no detected murmur.         Carotid Bruits Right Left   Negative Negative   Radial pulses are 2+ palpable bilaterally   Adominal aortic pulse is not palpable  VASCULAR EXAM: Extremities with ischemic changes in toes, cyanosis in several toes of both feet, see photos below, without Gangrene; without open wounds.   Left lower leg, medial and proximal aspect, shallow wound at well healed incision that has signs of healing, no purulence, scant serous drainage on dressing that was removed                                                                                                                   LE Pulses Right Left       FEMORAL  2+ palpable  2+ palpable        POPLITEAL  not palpable   not palpable       POSTERIOR TIBIAL  1-2+ palpable   1-2+ palpable        DORSALIS PEDIS      ANTERIOR TIBIAL 1+ palpable  faintly palpable    Abdomen: soft, NT, no palpable masses. Skin: no rashes, no cellulitis, no ulcers noted. Musculoskeletal: no muscle wasting or atrophy.  Neurologic: A&O X 3; appropriate affect, Sensation is normal; MOTOR FUNCTION:  moving all extremities equally, motor strength 5/5 throughout. Speech is fluent/normal. CN 2-12 intact. Psychiatric: Thought content is normal, mood appropriate for clinical situation.     DATA  Left LE Arterial Duplex (08-25-19): +--------+--------+-----+--------+--------+--------+ LEFT PSV cm/sRatioStenosisWaveformComments +--------+--------+-----+--------+--------+--------+ SFA Prox51   biphasic  +--------+--------+-----+--------+--------+--------+  Left Graft #1: Superficial femoral to  popliteal +--------------------+--------+--------+--------+--------+  PSV cm/sStenosisWaveformComments +--------------------+--------+--------+--------+--------+ Inflow 41  biphasic  +--------------------+--------+--------+--------+--------+ Proximal Anastomosis53  biphasic  +--------------------+--------+--------+--------+--------+ Proximal Graft 54  biphasic  +--------------------+--------+--------+--------+--------+ Mid Graft 71  biphasic  +--------------------+--------+--------+--------+--------+ Distal Graft 72  biphasic  +--------------------+--------+--------+--------+--------+ Distal Anastomosis 74  biphasic  +--------------------+--------+--------+--------+--------+ Outflow 130  biphasic  +--------------------+--------+--------+--------+--------+ Summary: Left: Patent left superficial femoral to popliteal artery bypass graft with no evidence of restenosis.   Left LE Arterial Duplex (05-06-19): Left Graft #1: +--------------------+--------+--------+---------+--------+  PSV cm/sStenosisWaveform Comments +--------------------+--------+--------+---------+--------+ Inflow 68  triphasic  +--------------------+--------+--------+---------+--------+ Proximal Anastomosis42  triphasic  +--------------------+--------+--------+---------+--------+ Proximal Graft 48  triphasic  +--------------------+--------+--------+---------+--------+ Mid Graft 59  triphasic  +--------------------+--------+--------+---------+--------+ Distal Graft 123  triphasic   +--------------------+--------+--------+---------+--------+ Distal Anastomosis 47  biphasic   +--------------------+--------+--------+---------+--------+ Outflow 90  biphasic   +--------------------+--------+--------+---------+--------+ Summary: Left: Patent left superficial femoral artery to below-knee popliteal artery bypass graft without evidence of stenosis.   ABI Findings (08-25-19): +---------+------------------+-----+---------+--------+ Right Rt Pressure (mmHg)IndexWaveform Comment  +---------+------------------+-----+---------+--------+ Brachial 169     +---------+------------------+-----+---------+--------+ ATA 156 0.92    +---------+------------------+-----+---------+--------+ PTA 160 0.95 triphasic  +---------+------------------+-----+---------+--------+ DP   biphasic   +---------+------------------+-----+---------+--------+ Great Toe75 0.44    +---------+------------------+-----+---------+--------+  +---------+------------------+-----+----------+-------+ Left Lt Pressure (mmHg)IndexWaveform Comment +---------+------------------+-----+----------+-------+ Brachial 162     +---------+------------------+-----+----------+-------+ ATA 157 0.93 monophasic  +---------+------------------+-----+----------+-------+ PTA 142 0.84 triphasic   +---------+------------------+-----+----------+-------+ Great Toe75 0.44    +---------+------------------+-----+----------+-------+  +-------+-----------+-----------+------------+------------+ ABI/TBIToday's ABIToday's  TBIPrevious ABIPrevious TBI +-------+-----------+-----------+------------+------------+ Right 0.95 0.44 1.0 0.52  +-------+-----------+-----------+------------+------------+ Left 0.93 0.44 0.96 0.57  +-------+-----------+-----------+------------+------------+ Right ABIs appear essentially unchanged compared to prior study on 080/25/2020. Left ABIs appear essentially unchanged compared to prior  study on 05/06/2019. Summary: Right: Resting right ankle-brachial index is within normal range. No evidence of significant right lower extremity arterial disease. The right toe-brachial index is abnormal. RT great toe pressure = 75 mmHg. Left: Resting left ankle-brachial index indicates mild left lower extremity arterial disease. The left toe-brachial index is abnormal. LT Great toe pressure = 75 mmHg.   ABI Findings(05-06-19): +---------+------------------+-----+---------+--------+ Right Rt Pressure (mmHg)IndexWaveform Comment  +---------+------------------+-----+---------+--------+ Brachial 164  triphasic  +---------+------------------+-----+---------+--------+ PTA 164 1.00 triphasic  +---------+------------------+-----+---------+--------+ DP 140 0.85 biphasic   +---------+------------------+-----+---------+--------+ Great Toe85 0.52 Abnormal   +---------+------------------+-----+---------+--------+  +---------+------------------+-----+----------+-------+ Left Lt Pressure (mmHg)IndexWaveform Comment +---------+------------------+-----+----------+-------+ Brachial 163  triphasic   +---------+------------------+-----+----------+-------+ PTA 158 0.96 triphasic   +---------+------------------+-----+----------+-------+ DP  96 0.59 monophasic  +---------+------------------+-----+----------+-------+ Great Toe94 0.57 Abnormal   +---------+------------------+-----+----------+-------+  +-------+-----------+-----------+------------+------------+ ABI/TBIToday's ABIToday's TBIPrevious ABIPrevious TBI +-------+-----------+-----------+------------+------------+ Right 1.00 0.52 0.93 N/A  +-------+-----------+-----------+------------+------------+ Left 0.96 0.57 0.66 N/A  +-------+-----------+-----------+------------+------------+ Bilateral ABIs appear increased compared to prior study on 10/23/2018. Summary: Right: Resting right ankle-brachial index is within normal range. No evidence of significant right lower extremity arterial disease. The right toe-brachial index is abnormal. RT great toe pressure = 85 mmHg. Left: Resting left ankle-brachial index is within normal range. No evidence of significant left lower extremity arterial disease. The left toe-brachial index is abnormal. LT Great toe pressure = 94 mmHg.    ASSESSMENT/PLAN: [name] is a 73 y.o. female who is s/pleft superficial femoral artery to below-knee popliteal artery bypass using non-reversed left greater saphenous veinon 10-22-18 by Dr. Oneida Alar for rest pain in left foot, which then required I&D of left thigh on 11-08-18 by Dr. Oneida Alar for left thigh wound infection.  08-25-19 ABI's show no disease in the right leg, bi and triphasic waveforms, and mild disease in the left with tri and monophasic waveforms.  Left leg arterial duplex on 08-25-19 shows a patent left superficial femoral to popliteal artery bypass graft with no evidence of restenosis, all biphasic waveforms.  Her pedal pulses are palpable. However, she has cyanosis of toes of both feet, left more so than right, left 3rd toe is painful.   She takes a  daily ASA, does not take a statin as a statin caused nausea.  She has been walking her dog daily, carries a portable fold up chair, stops several times.   Continue 10 day course of Keflex started 2 days ago, for small shallow opening of left leg incision that appears to be healing.    She is not seeing a podiatrist: referred to [facility] for follow up PAD, and evaluation of discoloaration at left toes.   Dr. Donzetta Matters spoke with and examined pt.  Will schedule CT of chest aorta and CT abd/pelvis with run off ASAP to evaluate for possible source of emboli, possible microemboli in toes of both feet.  Follow up with Dr. Oneida Alar after the CT.   Will prescribe Xarelto starter pack, pt states she has no bleeding issues. Toes of both feet have the appearance of being showered with microemboli. She is at risk of losing her toes.    Clemon Chambers, RN, MSN, FNP-C [facility] Phone: [phone]  Clinic MD: Donzetta Matters  08/29/19 2:37 PM

## 2019-09-01 ENCOUNTER — Telehealth (INDEPENDENT_AMBULATORY_CARE_PROVIDER_SITE_OTHER): Payer: Medicare Other | Admitting: Family Medicine

## 2019-09-01 ENCOUNTER — Other Ambulatory Visit: Payer: Self-pay

## 2019-09-01 DIAGNOSIS — I779 Disorder of arteries and arterioles, unspecified: Secondary | ICD-10-CM | POA: Diagnosis not present

## 2019-09-01 DIAGNOSIS — I739 Peripheral vascular disease, unspecified: Secondary | ICD-10-CM | POA: Diagnosis not present

## 2019-09-01 DIAGNOSIS — R110 Nausea: Secondary | ICD-10-CM

## 2019-09-01 DIAGNOSIS — L819 Disorder of pigmentation, unspecified: Secondary | ICD-10-CM

## 2019-09-01 DIAGNOSIS — M79674 Pain in right toe(s): Secondary | ICD-10-CM | POA: Diagnosis not present

## 2019-09-01 DIAGNOSIS — R0609 Other forms of dyspnea: Secondary | ICD-10-CM

## 2019-09-01 DIAGNOSIS — R0600 Dyspnea, unspecified: Secondary | ICD-10-CM

## 2019-09-01 DIAGNOSIS — J849 Interstitial pulmonary disease, unspecified: Secondary | ICD-10-CM | POA: Diagnosis not present

## 2019-09-01 DIAGNOSIS — R230 Cyanosis: Secondary | ICD-10-CM | POA: Diagnosis not present

## 2019-09-01 NOTE — Progress Notes (Signed)
Virtual Visit via Telephone Note  I connected with [name] on 09/01/19 at 2:22 PM by telephone and verified that I am speaking with the correct person using two identifiers.   I discussed the limitations, risks, security and privacy concerns of performing an evaluation and management service by telephone and the availability of in person appointments. I also discussed with the patient that there may be a patient responsible charge related to this service. The patient expressed understanding and agreed to proceed, consent obtained  Chief complaint:  Chief Complaint  Patient presents with  . Follow-up    Medical Conditions   Nausea, PVD,Fatigue, hypertension  History of Present Illness: [name] is a 73 y.o. female  Last evaluated December 7 for multiple ongoing concerns.   Nausea/vomiting. Denied vomiting few weeks prior to last visit.  Half of Cymbalta and Lamictal dosing due to nausea.  Followed by psychiatry as well as her therapist for depression.  Recommended gastroenterology follow-up with history of IBS, increased gassiness stool on previous 1 view abdomen x-ray December 7th, but no free fluid or free air. Has not met with GI.  Still having nausea, but some better. No recurrence of vomiting. Taking pepcid- helps nausea. Still on 1/2 doses of cymbalta and Lamictal.   Interstitial lung disease Followed by pulmonary, idiopathic pulmonary fibrosis/interstitial lung disease referred to rheumatology in September for connective tissue disease work-up.  She had reported meeting with rheumatology but unsure of plan.  Pulmonologist is Dr. Gala Murdoch.  Cough last visit for months. covid test negative on 12/7.  Still flairs of cough, other times not at all.  Still some shortness of breath, fatigue for months. No recent changes.   Peripheral vascular disease Concern for color changes and blistering on toes at December 7 visit.  Plan for urgent vascular visit. she was  evaluated by vascular surgery December 16, telephone encounter. ABIs on December 14 showed no disease in the right leg, mild disease in the left.  Left leg arterial duplex 08/25/2019 with patent left superficial femoral pop bypass graft with no evidence of restenosis.  Keflex 500 mg 3 times daily started for blistering/wounds of the legs.  She was referred to try foot and ankle for follow-up of PAD and evaluation of the blisters.  Not thought to be ischemia related.  Office visit December 18 in person.  Pedal pulses were palpable however cyanosis of toes of both feet left more than right.  Continued on 10-day course of Keflex, she to schedule a CT of chest aorta and CT abdomen pelvis with runoff to evaluate for possible emboli/possible micro emboli in toes of both feet.  Follow-up with Dr. Oneida Alar after CT.  Was started on Xarelto starter pack.   Phone note reviewed from yesterday.  Noted very dark purple appearance to small toe on right foot.  Was unable to start Xarelto yet. Available today at pharmacy.  Today - notes left toes are a little bit lighter.  Left small R toe painful and purple - noted after visit with vascular 2 days ago. More purple than 2nd and 3rd toes on left at 12/28 visit - photo reviewed.   CT's ordered by vascular not scheduled until Jan 4th.  Last chest CT 05/22/19: IMPRESSION: 1. Spectrum of findings compatible with basilar predominant fibrotic interstitial lung disease without frank honeycombing. Findings have mildly progressed since 2017 chest CT. Findings are consistent with UIP per consensus guidelines: Diagnosis of Idiopathic Pulmonary Fibrosis: An Official ATS/ERS/JRS/ALAT Clinical Practice Guideline.  [place], Iss 5, [phone], May 12 2017. 2. Solitary new 4 mm solid left upper lobe pulmonary nodule. Follow-up chest CT advised in 6-12 months. 3. Stable mild mediastinal lymphadenopathy, compatible with benign reactive adenopathy. 4.  Two-vessel coronary atherosclerosis.    Patient Active Problem List   Diagnosis Date Noted  . Wound infection 11/07/2018  . Postoperative pain   . Sleep disturbance   . Tobacco abuse   . Acute blood loss anemia   . Hypoalbuminemia due to protein-calorie malnutrition ([place])   . Debility 10/24/2018  . Pre-operative cardiovascular examination 09/26/2018  . HLD (hyperlipidemia) 09/26/2018  . GAD (generalized anxiety disorder) 08/07/2018  . Major depressive disorder, single episode 08/07/2018  . Appendiceal abscess 07/08/2018  . PVD (peripheral vascular disease) ([place]) 03/15/2018  . Atherosclerosis of native artery of left lower extremity with intermittent claudication ([place]) 03/15/2018  . Chronic left-sided low back pain with left-sided sciatica 12/25/2017  . Facial droop   . History of CVA (cerebrovascular accident) 10/23/2017  . Critical lower limb ischemia 11/08/2016  . Smoker 11/08/2016  . Postinflammatory pulmonary fibrosis ([place]) 02/14/2016  . Cigarette smoker 12/24/2015  . Hypothyroid ? 01/16/2014  . Mild cognitive impairment 01/16/2014  . Meningioma ([place]) 10/21/2013  . Chest pain 10/20/2013  . Medicare annual wellness visit, subsequent 06/16/2013  . Dizziness and giddiness 06/16/2013  . RLS (restless legs syndrome) 04/29/2012  . Abdominal pain 12/27/2010  . DEGENERATIVE JOINT DISEASE 09/23/2010  . CAROTID ARTERY DISEASE 08/15/2010  . CHEST PAIN 08/15/2010  . HEADACHE 12/02/2009  . BACK PAIN 11/22/2009  . CONSTIPATION, CHRONIC 01/29/2008  . NAUSEA 01/28/2008  . DEPRESSION 03/08/2007  . HTN (hypertension) 03/08/2007   Past Medical History:  Diagnosis Date  . AAA (abdominal aortic aneurysm) (HCC)    3.1 cm 07/08/18, 3 year follow-up recommended; possible 3 cm AAA by aortogram 09/13/18  . Anemia    PMH  . Appendicitis with abscess    07/08/18, s/p perc drain; resolved 07/30/18 by CT  . Arthritis   . Bipolar disorder ([place])   . Cerebrovascular disease    intra and  extracranial vascular dx per MRI 4/11, neurology rec strict CVRF control  . Colonic inertia   . Constipation    chronic;severe  . Coronary artery disease   . Depression   . Duodenitis   . EKG abnormalities    changes, stress test neg (false EKG changes)  . Gastritis   . GERD (gastroesophageal reflux disease)   . Hypertension   . Hypothyroid 01/16/2014  . Meningioma ([place]) 10/21/2013  . PAD (peripheral artery disease) ([place])   . Psoriasis    sees derm  . Stroke ([place])   . Wears dentures   . Wears glasses    Past Surgical History:  Procedure Laterality Date  . ABDOMINAL AORTOGRAM W/LOWER EXTREMITY N/A 09/13/2018   Procedure: ABDOMINAL AORTOGRAM W/LOWER EXTREMITY;  Surgeon: Elam Dutch, MD;  Location: [facility];  Service: Cardiovascular;  Laterality: N/A;  . arthroscopy  04/2010   Right knee  . CATARACT EXTRACTION W/ INTRAOCULAR LENS  IMPLANT, BILATERAL    . COLONOSCOPY    . FEMORAL-POPLITEAL BYPASS GRAFT  10/22/2018  . FEMORAL-POPLITEAL BYPASS GRAFT Left 10/22/2018   Procedure: LEFT FEMORAL TO BELOW THE KNEE POPLITEAL ARTERY BYPASS GRAFT;  Surgeon: Elam Dutch, MD;  Location: [facility];  Service: Vascular;  Laterality: Left;  . I & D EXTREMITY Left 11/08/2018   Procedure: IRRIGATION AND DEBRIDEMENT EXTREMITY Left Leg;  Surgeon: Elam Dutch, MD;  [facility];  Service: Vascular;  Laterality: Left;  . IR RADIOLOGIST EVAL & MGMT  07/30/2018  . MULTIPLE TOOTH EXTRACTIONS    . TUBAL LIGATION     No Known Allergies Prior to Admission medications   Medication Sig Start Date End Date Taking? Authorizing Provider  amLODipine (NORVASC) 10 MG tablet Take 1 tablet (10 mg total) by mouth daily. 03/07/19  Yes [name], MD  cephALEXin (KEFLEX) 500 MG capsule Take 1 capsule (500 mg total) by mouth 3 (three) times daily. 08/27/19  Yes Nickel, Sharmon Leyden, NP  clonazePAM (KLONOPIN) 0.5 MG tablet TAKE 1 TABLET BY MOUTH AT BEDTIME AS NEEDED FOR ANXIETY 07/28/19  Yes Cottle,  Billey Co., MD  DULoxetine (CYMBALTA) 30 MG capsule Take 1 capsule (30 mg total) by mouth every evening. 04/15/19  Yes Cottle, Billey Co., MD  famotidine (PEPCID) 20 MG tablet One at bedtime 02/14/16  Yes Tanda Rockers, MD  lamoTRIgine (LAMICTAL) 150 MG tablet TAKE 1 TABLET(150 MG) BY MOUTH TWICE DAILY 05/23/19  Yes Cottle, Billey Co., MD  linaclotide [facility]) 145 MCG CAPS capsule Take 1 capsule (145 mcg total) by mouth daily before breakfast. 05/14/19  Yes Willia Craze, NP  losartan (COZAAR) 50 MG tablet Take 1 tablet (50 mg total) by mouth daily. 03/07/19  Yes [name], MD  polyethylene glycol powder (GLYCOLAX/MIRALAX) powder Take 17 g by mouth every other day.   Yes [provider]  Probiotic Product (PROBIOTIC-10 ULTIMATE PO) Take by mouth.   Yes [provider]  Rivaroxaban 15 & 20 MG TBPK Follow package directions: Take one 110m tablet by mouth twice a day. On day 22, switch to one 239mtablet once a day. Take with food. 08/29/19  Yes Nickel, SuSharmon LeydenNP  rOPINIRole (REQUIP) 2 MG tablet TAKE 1 AND 1/2 TABLETS(3 MG) BY MOUTH AT BEDTIME 07/28/19  Yes Cottle, CaBilley Co MD   Social History   Socioeconomic History  . Marital status: Widowed    Spouse name: Not on file  . Number of children: 1  . Years of education: Not on file  . Highest education level: Not on file  Occupational History  . Occupation: retired  Tobacco Use  . Smoking status: Current Every Day Smoker    Packs/day: 0.75    Years: 60.00    Pack years: 45.00    Types: Cigarettes  . Smokeless tobacco: Never Used  Substance and Sexual Activity  . Alcohol use: Yes    Alcohol/week: 0.0 standard drinks    Comment: yes on occassion  . Drug use: No  . Sexual activity: Not Currently  Other Topics Concern  . Not on file  Social History Narrative   Brother in law Mr NiRexford Mausone of my patients)   Lives w/ husband       Social Determinants of Health   Financial Resource Strain:  Low Risk   . Difficulty of Paying Living Expenses: Not hard at all  Food Insecurity: Unknown  . Worried About RuCharity fundraisern the Last Year: Patient refused  . Ran Out of Food in the Last Year: Patient refused  Transportation Needs: Unknown  . Lack of Transportation (Medical): Patient refused  . Lack of Transportation (Non-Medical): Patient refused  Physical Activity: Unknown  . Days of Exercise per Week: Patient refused  . Minutes of Exercise per Session: Patient refused  Stress:   . Feeling of  Stress : Not on file  Social Connections: Unknown  . Frequency of Communication with Friends and Family: Patient refused  . Frequency of Social Gatherings with Friends and Family: Patient refused  . Attends Religious Services: Patient refused  . Active Member of Clubs or Organizations: Patient refused  . Attends Archivist Meetings: Patient refused  . Marital Status: Patient refused  Intimate Partner Violence: Unknown  . Fear of Current or Ex-Partner: Patient refused  . Emotionally Abused: Patient refused  . Physically Abused: Patient refused  . Sexually Abused: Patient refused     Observations/Objective: There were no vitals filed for this visit. Speaking normally, no distress.  Appropriate responses speaking in full sentences.  Assessment and Plan: PVD (peripheral vascular disease) (HCC) Discoloration of skin of toe Pain of toe of right foot Toe cyanosis DOE (dyspnea on exertion) ILD (interstitial lung disease) (HCC)  -Longstanding dyspnea on exertion, cough.  CT chest with ILD in September.  More recent concern of toe cyanosis, discoloration, blistering seen previously.  Initial concern of flare of PVD, but reassuring ABIs and duplex with intact femoropopliteal graft.  Now more concerning for possible embolic syndrome with micro emboli in toes.  Initial plan for outpatient CT angio area and bifemoral runoff with CT angio chest aorta.  However now more concerning with  acute onset of purple left fifth toe, increasing pain, may be an acute ischemic toe.  Additionally unknown source of emboli if this is the cause.  -ER evaluation recommended today to evaluate for ischemic toe, as well as evaluation for possible source of micro emboli.  Unfortunately has not yet started anticoagulation.  Nausea without vomiting  - Longstanding nausea, slight improvement recently, no recent vomiting.  Still recommended gastroenterology follow-up, but imaging of the abdomen may also be helpful as differential includes mesenteric venous thrombosis.  ER eval as above.  Follow Up Instructions: ER evaluation today    I discussed the assessment and treatment plan with the patient. The patient was provided an opportunity to ask questions and all were answered. The patient agreed with the plan and demonstrated an understanding of the instructions.   The patient was advised to call back or seek an in-person evaluation if the symptoms worsen or if the condition fails to improve as anticipated.  I provided 24 minutes of non-face-to-face time during this encounter. 43mn chart review.   Signed,   JMerri Ray MD [facility]  09/01/19

## 2019-09-01 NOTE — Patient Instructions (Signed)
° ° ° °  If you have lab work done today you will be contacted with your lab results within the next 2 weeks.  If you have not heard from us then please contact us. The fastest way to get your results is to register for My Chart. ° ° °IF you received an x-ray today, you will receive an invoice from [facility]. Please contact [facility] at [phone] with questions or concerns regarding your invoice.  ° °IF you received labwork today, you will receive an invoice from [facility]. Please contact [facility] at [phone] with questions or concerns regarding your invoice.  ° °Our billing staff will not be able to assist you with questions regarding bills from these companies. ° °You will be contacted with the lab results as soon as they are available. The fastest way to get your results is to activate your My Chart account. Instructions are located on the last page of this paperwork. If you have not heard from us regarding the results in 2 weeks, please contact this office. °  ° ° ° °

## 2019-09-02 ENCOUNTER — Other Ambulatory Visit: Payer: Self-pay | Admitting: Vascular Surgery

## 2019-09-02 DIAGNOSIS — R079 Chest pain, unspecified: Secondary | ICD-10-CM

## 2019-09-08 ENCOUNTER — Encounter: Payer: Self-pay | Admitting: Family Medicine

## 2019-09-11 ENCOUNTER — Ambulatory Visit (INDEPENDENT_AMBULATORY_CARE_PROVIDER_SITE_OTHER): Payer: Medicare Other | Admitting: Psychiatry

## 2019-09-11 ENCOUNTER — Encounter: Payer: Self-pay | Admitting: Psychiatry

## 2019-09-11 DIAGNOSIS — F411 Generalized anxiety disorder: Secondary | ICD-10-CM

## 2019-09-11 DIAGNOSIS — Z634 Disappearance and death of family member: Secondary | ICD-10-CM | POA: Diagnosis not present

## 2019-09-11 DIAGNOSIS — G4721 Circadian rhythm sleep disorder, delayed sleep phase type: Secondary | ICD-10-CM

## 2019-09-11 DIAGNOSIS — F332 Major depressive disorder, recurrent severe without psychotic features: Secondary | ICD-10-CM

## 2019-09-11 DIAGNOSIS — I779 Disorder of arteries and arterioles, unspecified: Secondary | ICD-10-CM | POA: Diagnosis not present

## 2019-09-11 DIAGNOSIS — G3184 Mild cognitive impairment, so stated: Secondary | ICD-10-CM | POA: Diagnosis not present

## 2019-09-11 DIAGNOSIS — G2581 Restless legs syndrome: Secondary | ICD-10-CM

## 2019-09-11 NOTE — Progress Notes (Signed)
SAGE KINLAW FG:[id number] 1946/09/02 73 y.o.   Virtual Visit via WebEx  I connected with pt by audio and visual WebEx app and verified that I am speaking with the correct person using two identifiers.   I discussed the limitations, risks, security and privacy concerns of performing an evaluation and management service by t WebEx and the availability of in person appointments. I also discussed with the patient that there may be a patient responsible charge related to this service. The patient expressed understanding and agreed to proceed.  I discussed the assessment and treatment plan with the patient. The patient was provided an opportunity to ask questions and all were answered. The patient agreed with the plan and demonstrated an understanding of the instructions.   The patient was advised to call back or seek an in-person evaluation if the symptoms worsen or if the condition fails to improve as anticipated.  I provided 30 minutes of non-face-to-face time during this encounter. The call started at 235 and ended at 3:05 o'clock. The patient was located at home and the provider was located office.   Subjective:   Patient ID:  [name] is a 73 y.o. (DOB 1946-04-03) female.  Chief Complaint:  Chief Complaint  Patient presents with  . Follow-up    Medication Management  . Depression    Medication Management  . Anxiety    Medication Management  . grief    H and sister    Depression        Associated symptoms include decreased concentration and fatigue.  Associated symptoms include no suicidal ideas.  Past medical history includes anxiety.   Anxiety Symptoms include decreased concentration, nervous/anxious behavior and shortness of breath. Patient reports no confusion or suicidal ideas.     TIYE JIN presents to the office today for follow-up of depression and anxiety and MCI.    at visit January 09, 2019.  She was having problems with restless legs and ropinirole was  increased to 3 mg every afternoon.  There were no other med changes.  visit Feb 05, 2019.  She was having mixture of depression and anxiety and chronic insomnia with delayed sleep phase.  For the reasons mentioned at the last visit she was started on risperidone 0.25 mg nightly.  She was maintained on duloxetine 90 mg, clonazepam 1 mg nightly, lamotrigine 150 twice daily, and ropinirole 2 mg 1-1/2 tablets nightly for restless legs.  Last visit September 2020.  She had stopped the risperidone because she only took it a few days.  She felt nausea and spaced out on it.  No other meds were changed.  Doing very poorly.  Not getting dressed and not out of the house for 5 days.  Hard to walk with pain.  Falling apart overnight.  Feet hurt at night.  Worries over poor circulation to toes.  Placed on blood thinners.  Not a happy camper these days.    Continued health problems with COPD.  Energy is low and hard time walking.  Arthritis in back.  House on the market and that's stressful.  Sx largely unchanged but "it's circumstances".  Planning to move to [facility] but ambivalent.  Brother in law lives next door and wants her to stay here.  Also stressed over politics and social things.    Had period of crippling nausea and cut all meds back in 1/2.  Thinks it's mental issue.  Nausea is some better with dose reduction but is more depressed.  Feels  stuck and reduced concentration and productivity.  Frozen in space.  A lot is grief over [name]'s death and trying to get things done feeling under pressure.  Weird anger outbursts yelling at people in public and then in 10 minutes it's gone.  Anger on and off.   Anxiety and depression are similar.  Terrible energy.  Poor diet.  Sweets make her hyper.  Going to bed 5am the last week and up at noon.  Just sits in front of TV procrastinating going to bed.  Allergies she is having some rapid mood swings.  RLS managed.  Sleep pretty good, except irregular hours.Pt reports  that mood is Depressed and describes anxiety as worse and moderate in the same dose for depression.  She is also having problems with irritability. Anxiety symptoms include: Excessive Worry, Panic Symptoms,. Pt reports sleep is more regular and consistent with melatonin 10 mg nightly.. Pt reports that appetite is decreased. Pt reports that energy is poor and loss of interest or pleasure in usual activities, poor motivation and withdrawn from usual activities. Concentration is scattered. Crying spells.  Guilt feelings over relations with [name].  Suicidal thoughts:  denied by patient.  Increased ropinirole helped RLS.  Her sister had bipolar disorder.  Past Psychiatric Medication Trials: Lithium 300 insomnia, Depakote, olanzapine 2.5,  lamotrigine 300, duloxetine 90, ropinirole as high as 2 mg 3 times daily for restless legs, Aricept poorly tolerated, Abilify side effects, fluoxetine, sertraline, paroxetine, Lexapro Donepezil SE Long psychiatric history.  Sister bipolar. I have never prescribed lorazepam nor Xanax  Review of Systems:  Review of Systems  Constitutional: Positive for fatigue.  Respiratory: Positive for shortness of breath.   Cardiovascular:       Claudication   Neurological: Negative for tremors and weakness.  Psychiatric/Behavioral: Positive for decreased concentration, depression, dysphoric mood and sleep disturbance. Negative for agitation, behavioral problems, confusion, hallucinations, self-injury and suicidal ideas. The patient is nervous/anxious. The patient is not hyperactive.    LEG surgery for claudication Jan 3  Medications: I have reviewed the patient's current medications.  Current Outpatient Medications  Medication Sig Dispense Refill  . amLODipine (NORVASC) 10 MG tablet Take 1 tablet (10 mg total) by mouth daily. (Patient taking differently: Take 5 mg by mouth daily. ) 90 tablet 1  . cephALEXin (KEFLEX) 500 MG capsule Take 500 mg by mouth 4 (four) times  daily.    . clonazePAM (KLONOPIN) 0.5 MG tablet TAKE 1 TABLET BY MOUTH AT BEDTIME AS NEEDED FOR ANXIETY 30 tablet 1  . DULoxetine (CYMBALTA) 30 MG capsule Take 1 capsule (30 mg total) by mouth every evening. 90 capsule 1  . famotidine (PEPCID) 20 MG tablet One at bedtime 30 tablet 11  . lamoTRIgine (LAMICTAL) 150 MG tablet TAKE 1 TABLET(150 MG) BY MOUTH TWICE DAILY (Patient taking differently: Take 75 mg by mouth daily. ) 180 tablet 0  . linaclotide (LINZESS) 145 MCG CAPS capsule Take 1 capsule (145 mcg total) by mouth daily before breakfast. 30 capsule 5  . losartan (COZAAR) 50 MG tablet Take 1 tablet (50 mg total) by mouth daily. 90 tablet 1  . polyethylene glycol powder (GLYCOLAX/MIRALAX) powder Take 17 g by mouth every other day.    . Probiotic Product (PROBIOTIC-10 ULTIMATE PO) Take by mouth.    . Rivaroxaban (XARELTO) 15 MG TABS tablet Take 15 mg by mouth 2 (two) times daily with a meal.    . rOPINIRole (REQUIP) 2 MG tablet TAKE 1 AND 1/2 TABLETS(3 MG) BY MOUTH  AT BEDTIME 135 tablet 0   No current facility-administered medications for this visit.    Medication Side Effects: None  Allergies: No Known Allergies  Past Medical History:  Diagnosis Date  . AAA (abdominal aortic aneurysm) (HCC)    3.1 cm 07/08/18, 3 year follow-up recommended; possible 3 cm AAA by aortogram 09/13/18  . Anemia    PMH  . Appendicitis with abscess    07/08/18, s/p perc drain; resolved 07/30/18 by CT  . Arthritis   . Bipolar disorder ([place])   . Cerebrovascular disease    intra and extracranial vascular dx per MRI 4/11, neurology rec strict CVRF control  . Colonic inertia   . Constipation    chronic;severe  . Coronary artery disease   . Depression   . Duodenitis   . EKG abnormalities    changes, stress test neg (false EKG changes)  . Gastritis   . GERD (gastroesophageal reflux disease)   . Hypertension   . Hypothyroid 01/16/2014  . Meningioma ([place]) 10/21/2013  . PAD (peripheral artery disease) ([place])    . Psoriasis    sees derm  . Stroke ([place])   . Wears dentures   . Wears glasses     Family History  Problem Relation Age of Onset  . Breast cancer Mother        metastisis to bones  . Diabetes Son   . Heart disease Other        grandfather   . Alcohol abuse Brother   . Heart disease Brother   . Heart disease Maternal Aunt   . Lung cancer Brother        smoked  . Colon cancer Neg Hx     Social History   Socioeconomic History  . Marital status: Widowed    Spouse name: Not on file  . Number of children: 1  . Years of education: Not on file  . Highest education level: Not on file  Occupational History  . Occupation: retired  Tobacco Use  . Smoking status: Current Every Day Smoker    Packs/day: 0.75    Years: 60.00    Pack years: 45.00    Types: Cigarettes  . Smokeless tobacco: Never Used  Substance and Sexual Activity  . Alcohol use: Yes    Alcohol/week: 0.0 standard drinks    Comment: yes on occassion  . Drug use: No  . Sexual activity: Not Currently  Other Topics Concern  . Not on file  Social History Narrative   Brother in law Mr Rexford Maus (one of my patients)   Lives w/ husband       Social Determinants of Health   Financial Resource Strain: Low Risk   . Difficulty of Paying Living Expenses: Not hard at all  Food Insecurity: Unknown  . Worried About Charity fundraiser in the Last Year: Patient refused  . Ran Out of Food in the Last Year: Patient refused  Transportation Needs: Unknown  . Lack of Transportation (Medical): Patient refused  . Lack of Transportation (Non-Medical): Patient refused  Physical Activity: Unknown  . Days of Exercise per Week: Patient refused  . Minutes of Exercise per Session: Patient refused  Stress:   . Feeling of Stress : Not on file  Social Connections: Unknown  . Frequency of Communication with Friends and Family: Patient refused  . Frequency of Social Gatherings with Friends and Family: Patient refused  . Attends  Religious Services: Patient refused  . Active Member of Clubs or Organizations:  Patient refused  . Attends Archivist Meetings: Patient refused  . Marital Status: Patient refused  Intimate Partner Violence: Unknown  . Fear of Current or Ex-Partner: Patient refused  . Emotionally Abused: Patient refused  . Physically Abused: Patient refused  . Sexually Abused: Patient refused    Past Medical History, Surgical history, Social history, and Family history were reviewed and updated as appropriate.   Please see review of systems for further details on the patient's review from today.   Objective:   Physical Exam:  There were no vitals taken for this visit.  Physical Exam Neurological:     Mental Status: She is alert and oriented to person, place, and time.     Cranial Nerves: No dysarthria.  Psychiatric:        Attention and Perception: She is inattentive. She does not perceive auditory hallucinations.        Mood and Affect: Mood is anxious and depressed.        Speech: Speech normal. Speech is not rapid and pressured or slurred.        Behavior: Behavior is not slowed or aggressive. Behavior is cooperative.        Thought Content: Thought content normal. Thought content is not paranoid or delusional. Thought content does not include homicidal or suicidal ideation. Thought content does not include homicidal or suicidal plan.        Cognition and Memory: Memory is impaired.     Comments: She is somewhat chronically scattered and easily confused and that is continuing but partly related to grief since [name] died.  Still depressed, anxious and irritable.  Insight and judgment are fair     Lab Review:     Component Value Date/Time   NA 138 08/18/2019 1737   K 4.4 08/18/2019 1737   CL 100 08/18/2019 1737   CO2 21 08/18/2019 1737   GLUCOSE 97 08/18/2019 1737   GLUCOSE 100 (H) 11/20/2018 1842   BUN 17 08/18/2019 1737   CREATININE 0.92 08/18/2019 1737   CREATININE 0.77  07/28/2015 1805   CALCIUM 9.6 08/18/2019 1737   PROT 7.5 08/18/2019 1737   ALBUMIN 4.3 08/18/2019 1737   AST 21 08/18/2019 1737   ALT 16 08/18/2019 1737   ALKPHOS 81 08/18/2019 1737   BILITOT 0.3 08/18/2019 1737   GFRNONAA 61 08/18/2019 1737   GFRAA 70 08/18/2019 1737       Component Value Date/Time   WBC 8.2 08/18/2019 1737   WBC 9.5 11/20/2018 1842   RBC 4.77 08/18/2019 1737   RBC 4.31 11/20/2018 1842   HGB 14.0 08/18/2019 1737   HGB 12.6 04/04/2019 1536   HCT 41.2 (A) 08/18/2019 1737   HCT 37.8 04/04/2019 1536   PLT 257 04/04/2019 1536   MCV 86.4 08/18/2019 1737   MCV 87 04/04/2019 1536   MCH 29.3 08/18/2019 1737   MCH 29.2 11/20/2018 1842   MCHC 33.9 08/18/2019 1737   MCHC 31.5 11/20/2018 1842   RDW 14.6 04/04/2019 1536   LYMPHSABS 2.3 11/20/2018 1842   MONOABS 1.0 11/20/2018 1842   EOSABS 0.4 11/20/2018 1842   BASOSABS 0.1 11/20/2018 1842    No results found for: POCLITH, LITHIUM   No results found for: PHENYTOIN, PHENOBARB, VALPROATE, CBMZ   .res Assessment: Plan:    Severe episode of recurrent major depressive disorder, without psychotic features ([place])  Mild cognitive impairment  Generalized anxiety disorder  Bereavement  Delayed sleep phase syndrome  Restless legs syndrome  Lyndall has some  chronic depression and chronic irregular sleep patterns which have been resistant to treatment.  Her chronic depression and anxiety are complicated now by double grief of loss of her sister and now her husband.  She is a little more scattered as a result as well and is not sleeping as well.  Her insomnia and delayed sleep phase are chronic and somewhat the result of poor sleep hygiene.  We discussed sleep hygiene in detail.  Sleep hygiene issues discussed although this is been very resistant to change.  We need to make sure she is not drinking too much caffeine especially late in the day.  Cont counseling for grief, depression, anxiety.   Use pill box to improve  compliance.  Discussed serotonin withdrawal symptoms.  Also the risk of greater depression because of missing the meds. DT nausea  split meds but increase duloxetine 30 mg BID and lamotrigine 75 mg BID. She's been on lower dose DT nausea but is more depressed.  I still suspect this could be some variant of bipolar disorder but that is not certain.    She stopped the donepezil bc GI upset.  Consider Exelon patch for cognition.  But defer this because she is not in a condition to start a new medication at this time.  Continue ropinirole 3 mg for RLS.  This is better controlled her restless legs.  Disc SE incl hallucinations and dizziness.   This high dosage appears medically necessary.  Call if this fails and will consider doing something with the clonazepam either increasing it or perhaps switching to another benzodiazepine which might have less cognitive problems.  Such as lorazepam  Emphasized importance and proper nutrition and sleep and protein.  Doesn't eat much meat bc constipation but will eat fish.  Discussed how protein is necessary for the proper response to depression medicines  FU 2-3  mos.  Lynder Parents, MD, DFAPA  Please see After Visit Summary for patient specific instructions.  Future Appointments  Date Time Provider [place]  09/15/2019  2:10 PM [facility] 1 [facility]  09/15/2019  2:30 PM [facility] 1 [facility]  09/18/2019  9:00 AM Fields, Jessy Oto, MD [facility]    No orders of the defined types were placed in this encounter.     -------------------------------

## 2019-09-15 ENCOUNTER — Inpatient Hospital Stay: Admission: RE | Admit: 2019-09-15 | Payer: Medicare Other | Source: Ambulatory Visit

## 2019-09-15 ENCOUNTER — Other Ambulatory Visit: Payer: Medicare Other

## 2019-09-18 ENCOUNTER — Ambulatory Visit: Payer: Medicare Other | Admitting: Vascular Surgery

## 2019-09-24 ENCOUNTER — Other Ambulatory Visit: Payer: Medicare Other

## 2019-09-26 ENCOUNTER — Ambulatory Visit
Admission: RE | Admit: 2019-09-26 | Discharge: 2019-09-26 | Disposition: A | Discharge status: Home / Self Care | Payer: Medicare Other | Source: Ambulatory Visit | Attending: Vascular Surgery | Admitting: Vascular Surgery

## 2019-09-26 DIAGNOSIS — I712 Thoracic aortic aneurysm, without rupture: Secondary | ICD-10-CM | POA: Diagnosis not present

## 2019-09-26 DIAGNOSIS — R079 Chest pain, unspecified: Secondary | ICD-10-CM

## 2019-09-26 MED ORDER — IOPAMIDOL (ISOVUE-370) INJECTION 76%
100.0000 mL | Freq: Once | INTRAVENOUS | Status: AC | PRN
  Administered 2019-09-26: 100 mL via INTRAVENOUS

## 2019-10-02 ENCOUNTER — Encounter: Payer: Self-pay | Admitting: Vascular Surgery

## 2019-10-02 ENCOUNTER — Other Ambulatory Visit: Payer: Self-pay

## 2019-10-02 ENCOUNTER — Ambulatory Visit (INDEPENDENT_AMBULATORY_CARE_PROVIDER_SITE_OTHER): Payer: Medicare Other | Admitting: Vascular Surgery

## 2019-10-02 DIAGNOSIS — Z95820 Peripheral vascular angioplasty status with implants and grafts: Secondary | ICD-10-CM

## 2019-10-02 DIAGNOSIS — I739 Peripheral vascular disease, unspecified: Secondary | ICD-10-CM

## 2019-10-02 MED ORDER — RIVAROXABAN 20 MG PO TABS: 20.0000 mg | ORAL_TABLET | Freq: Every day | ORAL | 11 refills | Status: DC

## 2019-10-02 NOTE — Progress Notes (Signed)
Virtual Visit via Telephone Note  I connected with [name] on 10/02/2019 using the [email or web address] by telephone and verified that I was speaking with the correct person using two identifiers. Patient was located at home and accompanied by no one. I am located at our office on [facility].   The limitations of evaluation and management by telemedicine and the availability of in person appointments have been previously discussed with the patient and are documented in the patients chart. The patient expressed understanding and consented to proceed.  PCP: [name], MD   History of Present Illness: [name] is a 74 y.o. female who is status post left femoral to below-knee popliteal bypass with vein February 2020.  She was seen about a month ago for dusky feet bilaterally.  She was placed on Xarelto at that point as this appears suspicious for atheroemboli.  She states that the blisters that she had on her toes especially on the left foot have improved and healed.  She still has some numbness and tingling in her toes bilaterally.  She did not come for her office visit today because she was having nausea and vomiting.  We did the office visit as a telephone visit.  I did discuss with her that if she has persistent symptoms and has difficulty keeping herself hydrated she should consider being evaluated by her primary provider.  I also discussed with her that if she is having fever and chills she could potentially have Covid but as long as she is not short of breath and keeping hydrated that she could sit at home and ride this out.  She had a CT angiogram of the abdomen and pelvis with runoff on September 26, 2019.  This showed some areas of rough atherosclerosis in the arch and descending thoracic aorta.  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  No other suspicious site of atheroemboli was seen.  There were no exophytic masses.  I reviewed  all of these images myself.  As far as her left femoropopliteal bypass is concerned she still has a variety of aches and pains especially around her left patella region hopefully this will continue to improve with time.  Past Medical History:  Diagnosis Date  . AAA (abdominal aortic aneurysm) (HCC)    3.1 cm 07/08/18, 3 year follow-up recommended; possible 3 cm AAA by aortogram 09/13/18  . Anemia    PMH  . Appendicitis with abscess    07/08/18, s/p perc drain; resolved 07/30/18 by CT  . Arthritis   . Bipolar disorder ([place])   . Cerebrovascular disease    intra and extracranial vascular dx per MRI 4/11, neurology rec strict CVRF control  . Colonic inertia   . Constipation    chronic;severe  . Coronary artery disease   . Depression   . Duodenitis   . EKG abnormalities    changes, stress test neg (false EKG changes)  . Gastritis   . GERD (gastroesophageal reflux disease)   . Hypertension   . Hypothyroid 01/16/2014  . Meningioma ([place]) 10/21/2013  . PAD (peripheral artery disease) ([place])   . Psoriasis    sees derm  . Stroke ([place])   . Wears dentures   . Wears glasses     Past Surgical History:  Procedure Laterality Date  . ABDOMINAL AORTOGRAM W/LOWER EXTREMITY N/A 09/13/2018   Procedure: ABDOMINAL AORTOGRAM W/LOWER EXTREMITY;  Surgeon: Elam Dutch,  MD;  Location: [facility];  Service: Cardiovascular;  Laterality: N/A;  . arthroscopy  04/2010   Right knee  . CATARACT EXTRACTION W/ INTRAOCULAR LENS  IMPLANT, BILATERAL    . COLONOSCOPY    . FEMORAL-POPLITEAL BYPASS GRAFT  10/22/2018  . FEMORAL-POPLITEAL BYPASS GRAFT Left 10/22/2018   Procedure: LEFT FEMORAL TO BELOW THE KNEE POPLITEAL ARTERY BYPASS GRAFT;  Surgeon: Elam Dutch, MD;  Location: [facility];  Service: Vascular;  Laterality: Left;  . I & D EXTREMITY Left 11/08/2018   Procedure: IRRIGATION AND DEBRIDEMENT EXTREMITY Left Leg;  Surgeon: Elam Dutch, MD;  Location: [facility];  Service: Vascular;  Laterality: Left;   . IR RADIOLOGIST EVAL & MGMT  07/30/2018  . MULTIPLE TOOTH EXTRACTIONS    . TUBAL LIGATION      Current Meds  Medication Sig  . amLODipine (NORVASC) 10 MG tablet Take 1 tablet (10 mg total) by mouth daily. (Patient taking differently: Take 5 mg by mouth daily. )  . cephALEXin (KEFLEX) 500 MG capsule Take 500 mg by mouth 4 (four) times daily.  . clonazePAM (KLONOPIN) 0.5 MG tablet TAKE 1 TABLET BY MOUTH AT BEDTIME AS NEEDED FOR ANXIETY  . DULoxetine (CYMBALTA) 30 MG capsule Take 1 capsule (30 mg total) by mouth every evening.  . famotidine (PEPCID) 20 MG tablet One at bedtime  . lamoTRIgine (LAMICTAL) 150 MG tablet TAKE 1 TABLET(150 MG) BY MOUTH TWICE DAILY (Patient taking differently: Take 75 mg by mouth daily. )  . linaclotide (LINZESS) 145 MCG CAPS capsule Take 1 capsule (145 mcg total) by mouth daily before breakfast.  . losartan (COZAAR) 50 MG tablet Take 1 tablet (50 mg total) by mouth daily.  . polyethylene glycol powder (GLYCOLAX/MIRALAX) powder Take 17 g by mouth every other day.  . Probiotic Product (PROBIOTIC-10 ULTIMATE PO) Take by mouth.  . Rivaroxaban (XARELTO) 15 MG TABS tablet Take 15 mg by mouth 2 (two) times daily with a meal.  . rOPINIRole (REQUIP) 2 MG tablet TAKE 1 AND 1/2 TABLETS(3 MG) BY MOUTH AT BEDTIME   Review of systems: She is having some fever and chills today.  She is not short of breath.  She does not have any open ulcers or wounds.    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  Her left leg bypass is patent.  I renewed the patient's Xarelto prescription today.  She is currently on 20 mg once daily.  I gave her a supply of 30 with 11 refills.  Follow Up Instructions:   Patient will follow up with me in 4 to 6 weeks to recheck her feet.   I discussed the  assessment and treatment plan with the patient. The patient was provided an opportunity to ask questions and all were answered. The patient agreed with the plan and demonstrated an understanding of the instructions.   The patient was advised to call back or seek an in-person evaluation if the symptoms worsen or if the condition fails to improve as anticipated.  I spent 15 minutes with the patient via telephone encounter.   Signed, Ruta Hinds [facility] Office: [phone]  10/02/2019, 10:57 AM

## 2019-10-21 ENCOUNTER — Other Ambulatory Visit: Payer: Self-pay | Admitting: Psychiatry

## 2019-10-31 ENCOUNTER — Other Ambulatory Visit: Payer: Self-pay | Admitting: Psychiatry

## 2019-11-04 ENCOUNTER — Ambulatory Visit (INDEPENDENT_AMBULATORY_CARE_PROVIDER_SITE_OTHER): Payer: Medicare Other | Admitting: Psychiatry

## 2019-11-04 DIAGNOSIS — I998 Other disorder of circulatory system: Secondary | ICD-10-CM | POA: Diagnosis not present

## 2019-11-04 DIAGNOSIS — Z8673 Personal history of transient ischemic attack (TIA), and cerebral infarction without residual deficits: Secondary | ICD-10-CM | POA: Diagnosis not present

## 2019-11-04 DIAGNOSIS — M5442 Lumbago with sciatica, left side: Secondary | ICD-10-CM | POA: Diagnosis not present

## 2019-11-04 DIAGNOSIS — I70229 Atherosclerosis of native arteries of extremities with rest pain, unspecified extremity: Secondary | ICD-10-CM

## 2019-11-04 DIAGNOSIS — F411 Generalized anxiety disorder: Secondary | ICD-10-CM | POA: Diagnosis not present

## 2019-11-04 DIAGNOSIS — F331 Major depressive disorder, recurrent, moderate: Secondary | ICD-10-CM

## 2019-11-04 DIAGNOSIS — G8929 Other chronic pain: Secondary | ICD-10-CM | POA: Diagnosis not present

## 2019-11-04 NOTE — Progress Notes (Signed)
Psychotherapy Progress Note [facility] Luan Moore, PhD LP  Patient ID: [name]     MRN: [id number] Therapy format: Individual psychotherapy Date: 11/04/2019      Start: 4:21p     Stop: 5:11p     Time Spent: 50 min Location: Telehealth visit -- I connected with this patient by an approved telecommunication method (audio only), with her informed consent, and verifying identity and patient privacy.  I was located at my office and patient at her home.  As needed, we discussed the limitations, risks, and security and privacy concerns associated with telehealth service, including the availability and conditions which currently govern in-person appointments and the possibility that 3rd-party payment may not be fully guaranteed and she may be responsible for charges.  After she indicated understanding, we proceeded with the session.  Also discussed treatment planning, as needed, including ongoing verbal agreement with the plan, the opportunity to ask and answer all questions, her demonstrated understanding of instructions, and her readiness to call the office should symptoms worsen or she feels she is in a crisis state and needs more immediate and tangible assistance.   Session narrative (presenting needs, interim history, self-report of stressors and symptoms, applications of prior therapy, status changes, and interventions made in session) Disappointing drama with selling her house -- had an offer, seemed botched by the part-time realtor she had, took it off market.  Had an unexpected offer, accepted, and today is her last day to make a decision, after extending due diligence by 4 days.  Has made agreement to fix limited items and make it an "as-is" sale beyond that, but she is also getting frequent enough, unsolicited, and unrealistic advice from her son and brother-in-law to stay in the house, based on more or less irrelevant ideas like how her older dog knows the place as home  and how her widowed brother-in-law lives next door.  Feeling uncharacteristically intimidated by all the advice, though she still feels she should downsize (stairs have become almost prohibitive, and she does not need the space).    Discussed values and needs when it comes to her housing situation.  Clear that the dog's "home" is her, wherever she is, and her brother-in-law is very guarded about his space and solitude, otherwise they could potentially be roommates and spare the expense of one house.  Granddaughter has expressed interest in her relocating to [place], but not with any plan to offer to live with her or close by.  Many logistics would need to be worked out to go home to  [place], anyway, and her ability to get to old favorite places on her own is liable to be too compromised to make it worthwhile.  Not to mention wholesale transfer of health care and getting a team of new doctors up to speed on her conditions.  Overall, decided it is worth going through with the sale and finding a smaller, one-story place.  Discussed things to look for in a community and the possibility of finding a progressive care community in case of need.  Admits her physical condition is deteriorating -- using a cane routinely now, on blood thinner, circulation in toes has been affected by heavy metals, with what sounds like neuropathy.  Noted how heavy metals could be supplied by cigarette smoke, and the finding makes it especially commendable that she quit.  Recommended giving herself the opportunity to try breathing through pursed lips or a straw as a nonsmoking simulation and pay attention  to the positive effects on her breathing and nervous system, see if clean-air experience delivers what she normally wants out of smoking.  Agrees to try it.  Therapeutic modalities: Cognitive Behavioral Therapy and Solution-Oriented/Positive Psychology  Mental Status/Observations:  Appearance:   Not assessed     Behavior:   Appropriate  Motor:  Not assessed  Speech/Language:   Clear and Coherent  Affect:  Not assessed  Mood:  anxious  Thought process:  normal  Thought content:    worries  Sensory/Perceptual disturbances:    WNL  Orientation:  Fully oriented  Attention:  Good  Concentration:  Good  Memory:  grossly intact  Insight:    Good  Judgment:   Good  Impulse Control:  Fair   Risk Assessment: Danger to Self: No Self-injurious Behavior: No Danger to Others: No Physical Aggression / Violence: No Duty to Warn: No Access to Firearms a concern: No  Assessment of progress:  situational setback(s)  Diagnosis:   ICD-10-CM   1. Generalized anxiety disorder  F41.1   2. Major depressive disorder, recurrent episode, moderate (HCC)  F33.1   3. Chronic left-sided low back pain with left-sided sciatica  M54.42    G89.29   4. Critical lower limb ischemia  I99.8   5. History of CVA (cerebrovascular accident)  Z86.73    Plan:  . Follow through on commitment to sell house . Seek senior living and senior-friendly real estate promptly, engage a realtor if desired to help search . Other recommendations/advice as may be noted above . Continue to utilize previously learned skills ad lib . Maintain medication as prescribed and work faithfully with relevant prescriber(s) if any changes are desired or seem indicated . Call the clinic on-call service, present to ER, or call 911 if any life-threatening psychiatric crisis . Return for time as available.Marland Kitchen  Next scheduled visit in this office Visit date not found. . Should schedule with Dr. Clovis Pu -- anticipated followup time soon  Blanchie Serve, PhD Luan Moore, PhD LP Clinical Psychologist, [facility] [address], [place], [place] [address] [phone]

## 2019-11-05 ENCOUNTER — Ambulatory Visit: Payer: Medicare Other | Admitting: Adult Health Nurse Practitioner

## 2019-11-06 ENCOUNTER — Ambulatory Visit: Payer: Medicare Other | Admitting: Adult Health Nurse Practitioner

## 2019-11-06 ENCOUNTER — Ambulatory Visit: Payer: Medicare Other | Admitting: Vascular Surgery

## 2019-11-10 ENCOUNTER — Ambulatory Visit: Payer: Medicare Other | Admitting: Family Medicine

## 2019-11-10 ENCOUNTER — Ambulatory Visit (INDEPENDENT_AMBULATORY_CARE_PROVIDER_SITE_OTHER): Payer: Medicare Other | Admitting: Family Medicine

## 2019-11-10 ENCOUNTER — Encounter: Payer: Self-pay | Admitting: Family Medicine

## 2019-11-10 ENCOUNTER — Emergency Department (HOSPITAL_COMMUNITY): Payer: Medicare Other

## 2019-11-10 ENCOUNTER — Inpatient Hospital Stay (HOSPITAL_COMMUNITY)
Admission: EM | Admit: 2019-11-10 | Discharge: 2019-11-27 | DRG: 871 | Disposition: A | Discharge status: Skilled Nursing Facility | Payer: Medicare Other | Source: Ambulatory Visit | Attending: Internal Medicine | Admitting: Internal Medicine

## 2019-11-10 ENCOUNTER — Other Ambulatory Visit: Payer: Self-pay

## 2019-11-10 ENCOUNTER — Encounter (HOSPITAL_COMMUNITY): Payer: Self-pay | Admitting: Emergency Medicine

## 2019-11-10 VITALS — BP 100/64 | HR 98 | Temp 97.2°F | Ht 68.0 in

## 2019-11-10 DIAGNOSIS — I739 Peripheral vascular disease, unspecified: Secondary | ICD-10-CM | POA: Diagnosis not present

## 2019-11-10 DIAGNOSIS — Z9911 Dependence on respirator [ventilator] status: Secondary | ICD-10-CM

## 2019-11-10 DIAGNOSIS — J181 Lobar pneumonia, unspecified organism: Secondary | ICD-10-CM | POA: Diagnosis not present

## 2019-11-10 DIAGNOSIS — F319 Bipolar disorder, unspecified: Secondary | ICD-10-CM | POA: Diagnosis not present

## 2019-11-10 DIAGNOSIS — I714 Abdominal aortic aneurysm, without rupture: Secondary | ICD-10-CM | POA: Diagnosis not present

## 2019-11-10 DIAGNOSIS — R6520 Severe sepsis without septic shock: Secondary | ICD-10-CM | POA: Diagnosis not present

## 2019-11-10 DIAGNOSIS — K582 Mixed irritable bowel syndrome: Secondary | ICD-10-CM | POA: Diagnosis present

## 2019-11-10 DIAGNOSIS — N390 Urinary tract infection, site not specified: Secondary | ICD-10-CM | POA: Diagnosis not present

## 2019-11-10 DIAGNOSIS — K219 Gastro-esophageal reflux disease without esophagitis: Secondary | ICD-10-CM | POA: Diagnosis present

## 2019-11-10 DIAGNOSIS — E876 Hypokalemia: Secondary | ICD-10-CM | POA: Diagnosis not present

## 2019-11-10 DIAGNOSIS — R319 Hematuria, unspecified: Secondary | ICD-10-CM | POA: Diagnosis not present

## 2019-11-10 DIAGNOSIS — K589 Irritable bowel syndrome without diarrhea: Secondary | ICD-10-CM | POA: Diagnosis not present

## 2019-11-10 DIAGNOSIS — E440 Moderate protein-calorie malnutrition: Secondary | ICD-10-CM | POA: Diagnosis present

## 2019-11-10 DIAGNOSIS — R0600 Dyspnea, unspecified: Secondary | ICD-10-CM | POA: Diagnosis not present

## 2019-11-10 DIAGNOSIS — R82998 Other abnormal findings in urine: Secondary | ICD-10-CM

## 2019-11-10 DIAGNOSIS — Z20822 Contact with and (suspected) exposure to covid-19: Secondary | ICD-10-CM | POA: Diagnosis not present

## 2019-11-10 DIAGNOSIS — R4182 Altered mental status, unspecified: Secondary | ICD-10-CM | POA: Diagnosis not present

## 2019-11-10 DIAGNOSIS — R6521 Severe sepsis with septic shock: Secondary | ICD-10-CM | POA: Diagnosis present

## 2019-11-10 DIAGNOSIS — G2581 Restless legs syndrome: Secondary | ICD-10-CM | POA: Diagnosis not present

## 2019-11-10 DIAGNOSIS — D638 Anemia in other chronic diseases classified elsewhere: Secondary | ICD-10-CM | POA: Diagnosis not present

## 2019-11-10 DIAGNOSIS — R10A0 Flank pain, unspecified side: Secondary | ICD-10-CM

## 2019-11-10 DIAGNOSIS — R1030 Lower abdominal pain, unspecified: Secondary | ICD-10-CM | POA: Diagnosis not present

## 2019-11-10 DIAGNOSIS — R6883 Chills (without fever): Secondary | ICD-10-CM | POA: Diagnosis not present

## 2019-11-10 DIAGNOSIS — N179 Acute kidney failure, unspecified: Secondary | ICD-10-CM

## 2019-11-10 DIAGNOSIS — Z7901 Long term (current) use of anticoagulants: Secondary | ICD-10-CM

## 2019-11-10 DIAGNOSIS — F17210 Nicotine dependence, cigarettes, uncomplicated: Secondary | ICD-10-CM | POA: Diagnosis present

## 2019-11-10 DIAGNOSIS — I110 Hypertensive heart disease with heart failure: Secondary | ICD-10-CM | POA: Diagnosis not present

## 2019-11-10 DIAGNOSIS — F329 Major depressive disorder, single episode, unspecified: Secondary | ICD-10-CM | POA: Diagnosis not present

## 2019-11-10 DIAGNOSIS — R34 Anuria and oliguria: Secondary | ICD-10-CM | POA: Diagnosis not present

## 2019-11-10 DIAGNOSIS — D649 Anemia, unspecified: Secondary | ICD-10-CM | POA: Diagnosis not present

## 2019-11-10 DIAGNOSIS — E871 Hypo-osmolality and hyponatremia: Secondary | ICD-10-CM | POA: Diagnosis not present

## 2019-11-10 DIAGNOSIS — R05 Cough: Secondary | ICD-10-CM | POA: Diagnosis not present

## 2019-11-10 DIAGNOSIS — Z833 Family history of diabetes mellitus: Secondary | ICD-10-CM

## 2019-11-10 DIAGNOSIS — F05 Delirium due to known physiological condition: Secondary | ICD-10-CM | POA: Diagnosis not present

## 2019-11-10 DIAGNOSIS — N281 Cyst of kidney, acquired: Secondary | ICD-10-CM | POA: Diagnosis not present

## 2019-11-10 DIAGNOSIS — J9601 Acute respiratory failure with hypoxia: Secondary | ICD-10-CM | POA: Diagnosis not present

## 2019-11-10 DIAGNOSIS — I2510 Atherosclerotic heart disease of native coronary artery without angina pectoris: Secondary | ICD-10-CM | POA: Diagnosis not present

## 2019-11-10 DIAGNOSIS — A419 Sepsis, unspecified organism: Secondary | ICD-10-CM | POA: Diagnosis not present

## 2019-11-10 DIAGNOSIS — Z6824 Body mass index (BMI) 24.0-24.9, adult: Secondary | ICD-10-CM

## 2019-11-10 DIAGNOSIS — I5033 Acute on chronic diastolic (congestive) heart failure: Secondary | ICD-10-CM | POA: Diagnosis not present

## 2019-11-10 DIAGNOSIS — Z86718 Personal history of other venous thrombosis and embolism: Secondary | ICD-10-CM

## 2019-11-10 DIAGNOSIS — Z9981 Dependence on supplemental oxygen: Secondary | ICD-10-CM | POA: Diagnosis not present

## 2019-11-10 DIAGNOSIS — Z801 Family history of malignant neoplasm of trachea, bronchus and lung: Secondary | ICD-10-CM

## 2019-11-10 DIAGNOSIS — N170 Acute kidney failure with tubular necrosis: Secondary | ICD-10-CM | POA: Diagnosis present

## 2019-11-10 DIAGNOSIS — R079 Chest pain, unspecified: Secondary | ICD-10-CM | POA: Diagnosis not present

## 2019-11-10 DIAGNOSIS — N289 Disorder of kidney and ureter, unspecified: Secondary | ICD-10-CM | POA: Diagnosis not present

## 2019-11-10 DIAGNOSIS — J84112 Idiopathic pulmonary fibrosis: Secondary | ICD-10-CM | POA: Diagnosis present

## 2019-11-10 DIAGNOSIS — R7989 Other specified abnormal findings of blood chemistry: Secondary | ICD-10-CM

## 2019-11-10 DIAGNOSIS — N12 Tubulo-interstitial nephritis, not specified as acute or chronic: Secondary | ICD-10-CM | POA: Diagnosis present

## 2019-11-10 DIAGNOSIS — I10 Essential (primary) hypertension: Secondary | ICD-10-CM | POA: Diagnosis not present

## 2019-11-10 DIAGNOSIS — Z803 Family history of malignant neoplasm of breast: Secondary | ICD-10-CM

## 2019-11-10 DIAGNOSIS — Z03818 Encounter for observation for suspected exposure to other biological agents ruled out: Secondary | ICD-10-CM | POA: Diagnosis not present

## 2019-11-10 DIAGNOSIS — Z8249 Family history of ischemic heart disease and other diseases of the circulatory system: Secondary | ICD-10-CM

## 2019-11-10 DIAGNOSIS — Z8673 Personal history of transient ischemic attack (TIA), and cerebral infarction without residual deficits: Secondary | ICD-10-CM | POA: Diagnosis not present

## 2019-11-10 DIAGNOSIS — J9600 Acute respiratory failure, unspecified whether with hypoxia or hypercapnia: Secondary | ICD-10-CM

## 2019-11-10 DIAGNOSIS — E039 Hypothyroidism, unspecified: Secondary | ICD-10-CM | POA: Diagnosis present

## 2019-11-10 DIAGNOSIS — J449 Chronic obstructive pulmonary disease, unspecified: Secondary | ICD-10-CM | POA: Diagnosis present

## 2019-11-10 DIAGNOSIS — R918 Other nonspecific abnormal finding of lung field: Secondary | ICD-10-CM

## 2019-11-10 DIAGNOSIS — Z7952 Long term (current) use of systemic steroids: Secondary | ICD-10-CM | POA: Diagnosis not present

## 2019-11-10 DIAGNOSIS — N19 Unspecified kidney failure: Secondary | ICD-10-CM

## 2019-11-10 DIAGNOSIS — R0609 Other forms of dyspnea: Secondary | ICD-10-CM

## 2019-11-10 DIAGNOSIS — R109 Unspecified abdominal pain: Secondary | ICD-10-CM | POA: Diagnosis not present

## 2019-11-10 DIAGNOSIS — R059 Cough, unspecified: Secondary | ICD-10-CM

## 2019-11-10 DIAGNOSIS — F419 Anxiety disorder, unspecified: Secondary | ICD-10-CM | POA: Diagnosis not present

## 2019-11-10 DIAGNOSIS — R5383 Other fatigue: Secondary | ICD-10-CM

## 2019-11-10 DIAGNOSIS — Z7401 Bed confinement status: Secondary | ICD-10-CM | POA: Diagnosis not present

## 2019-11-10 DIAGNOSIS — R0602 Shortness of breath: Secondary | ICD-10-CM | POA: Diagnosis not present

## 2019-11-10 DIAGNOSIS — A4151 Sepsis due to Escherichia coli [E. coli]: Principal | ICD-10-CM | POA: Diagnosis present

## 2019-11-10 DIAGNOSIS — Z86011 Personal history of benign neoplasm of the brain: Secondary | ICD-10-CM

## 2019-11-10 DIAGNOSIS — M2550 Pain in unspecified joint: Secondary | ICD-10-CM | POA: Diagnosis not present

## 2019-11-10 DIAGNOSIS — Z4682 Encounter for fitting and adjustment of non-vascular catheter: Secondary | ICD-10-CM | POA: Diagnosis not present

## 2019-11-10 DIAGNOSIS — J439 Emphysema, unspecified: Secondary | ICD-10-CM | POA: Diagnosis not present

## 2019-11-10 DIAGNOSIS — E872 Acidosis: Secondary | ICD-10-CM | POA: Diagnosis not present

## 2019-11-10 DIAGNOSIS — G9341 Metabolic encephalopathy: Secondary | ICD-10-CM | POA: Diagnosis not present

## 2019-11-10 DIAGNOSIS — R531 Weakness: Secondary | ICD-10-CM | POA: Diagnosis not present

## 2019-11-10 DIAGNOSIS — R0902 Hypoxemia: Secondary | ICD-10-CM | POA: Diagnosis not present

## 2019-11-10 DIAGNOSIS — R590 Localized enlarged lymph nodes: Secondary | ICD-10-CM | POA: Diagnosis present

## 2019-11-10 LAB — COMPREHENSIVE METABOLIC PANEL
ALT: 137 U/L — ABNORMAL HIGH (ref 0–44)
AST: 51 U/L — ABNORMAL HIGH (ref 15–41)
Albumin: 3.4 g/dL — ABNORMAL LOW (ref 3.5–5.0)
Alkaline Phosphatase: 136 U/L — ABNORMAL HIGH (ref 38–126)
Anion gap: 12 (ref 5–15)
BUN: 27 mg/dL — ABNORMAL HIGH (ref 8–23)
CO2: 22 mmol/L (ref 22–32)
Calcium: 8.9 mg/dL (ref 8.9–10.3)
Chloride: 93 mmol/L — ABNORMAL LOW (ref 98–111)
Creatinine, Ser: 2.59 mg/dL — ABNORMAL HIGH (ref 0.44–1.00)
GFR calc Af Amer: 20 mL/min — ABNORMAL LOW (ref >60)
GFR calc non Af Amer: 17 mL/min — ABNORMAL LOW (ref >60)
Glucose, Bld: 154 mg/dL — ABNORMAL HIGH (ref 70–99)
Potassium: 4.1 mmol/L (ref 3.5–5.1)
Sodium: 127 mmol/L — ABNORMAL LOW (ref 135–145)
Total Bilirubin: 1.1 mg/dL (ref 0.3–1.2)
Total Protein: 7.4 g/dL (ref 6.5–8.1)

## 2019-11-10 LAB — CBC WITH DIFFERENTIAL/PLATELET
Abs Immature Granulocytes: 0 10*3/uL (ref 0.00–0.07)
Basophils Absolute: 0 10*3/uL (ref 0.0–0.1)
Basophils Relative: 0 %
Eosinophils Absolute: 0 10*3/uL (ref 0.0–0.5)
Eosinophils Relative: 0 %
HCT: 38.2 % (ref 36.0–46.0)
Hemoglobin: 12.3 g/dL (ref 12.0–15.0)
Lymphocytes Relative: 5 %
Lymphs Abs: 0.9 10*3/uL (ref 0.7–4.0)
MCH: 29.7 pg (ref 26.0–34.0)
MCHC: 32.2 g/dL (ref 30.0–36.0)
MCV: 92.3 fL (ref 80.0–100.0)
Monocytes Absolute: 0.7 10*3/uL (ref 0.1–1.0)
Monocytes Relative: 4 %
Neutro Abs: 15.6 10*3/uL — ABNORMAL HIGH (ref 1.7–7.7)
Neutrophils Relative %: 91 %
Platelets: 244 10*3/uL (ref 150–400)
RBC: 4.14 MIL/uL (ref 3.87–5.11)
RDW: 14.3 % (ref 11.5–15.5)
WBC: 17.1 10*3/uL — ABNORMAL HIGH (ref 4.0–10.5)
nRBC: 0 % (ref 0.0–0.2)
nRBC: 0 /100 WBC

## 2019-11-10 LAB — POCT CBC
Granulocyte percent: 89.9 %G — AB (ref 37–80)
HCT, POC: 37.5 % (ref 29–41)
Hemoglobin: 12.4 g/dL (ref 11–14.6)
Lymph, poc: 1 (ref 0.6–3.4)
MCH, POC: 29.7 pg (ref 27–31.2)
MCHC: 33.1 g/dL (ref 31.8–35.4)
MCV: 89.8 fL (ref 76–111)
MID (cbc): 0.7 (ref 0–0.9)
MPV: 6.2 fL (ref 0–99.8)
POC Granulocyte: 14.8 — AB (ref 2–6.9)
POC LYMPH PERCENT: 5.9 %L — AB (ref 10–50)
POC MID %: 4.2 %M (ref 0–12)
Platelet Count, POC: 239 10*3/uL (ref 142–424)
RBC: 4.17 M/uL (ref 4.04–5.48)
RDW, POC: 14.4 %
WBC: 16.5 10*3/uL — AB (ref 4.6–10.2)

## 2019-11-10 LAB — PROTIME-INR
INR: 1.5 — ABNORMAL HIGH (ref 0.8–1.2)
Prothrombin Time: 18.3 seconds — ABNORMAL HIGH (ref 11.4–15.2)

## 2019-11-10 LAB — POCT URINALYSIS DIP (MANUAL ENTRY)
Bilirubin, UA: NEGATIVE
Glucose, UA: NEGATIVE mg/dL
Ketones, POC UA: NEGATIVE mg/dL
Nitrite, UA: NEGATIVE
Protein Ur, POC: >=300 mg/dL — AB
Spec Grav, UA: >=1.03 — AB (ref 1.010–1.025)
Urobilinogen, UA: 0.2 E.U./dL
pH, UA: 6 (ref 5.0–8.0)

## 2019-11-10 LAB — GLUCOSE, POCT (MANUAL RESULT ENTRY): POC Glucose: 138 mg/dl — AB (ref 70–99)

## 2019-11-10 LAB — LACTIC ACID, PLASMA: Lactic Acid, Venous: 1.7 mmol/L (ref 0.5–1.9)

## 2019-11-10 LAB — POC MICROSCOPIC URINALYSIS (UMFC): Mucus: ABSENT

## 2019-11-10 NOTE — Progress Notes (Signed)
Subjective:  Patient ID: [name], female    DOB: 07/19/1946  Age: 74 y.o. MRN: [id number]  CC:  Chief Complaint  Patient presents with  . Abdominal Pain    started 10 days ago. Pt reports blood in urine, no discharge. pt also reports buring while urinating. pt states her lower abdominal area is in pain. pt states when she coughs both L and R sides almost in the back start to hurt.  . Depression    pt had a PHQ-9 score of 13    HPI [name] presents for    Abdominal Pain: Started 10 days ago.  Has noticed dysuria and hematuria. Lower abdominal pain past 5 days.  Initially dark color, burning, then blood in urine after a few days - resolved after a few days, then period recurrence. No blood today, just dark with bad odor.  Low grade temp last night - 99.3. shaking chills last night. No chills today. No meds.  Left and R low back, flank pain with cough.  Chronic cough - not new.hx of COPD. More sore to cough - getting worse past 8-9 days.  Able to produce urine today  - less than usual. Has been drinking water. Less appetite past 10 days with abdominal pain.  Chronic diarrhea - more firm stools recently. BRBPR with hemorrhoids - plast 3 months. No recent BRBPR - none during current illness.  Increasing generalized fatigue/weakness - past few weeks, worsening past few days - having a hard time walking - sick to stomach and tired - has to lie down. More out of breath going up stairs since yesterday, only 1/2 way up then has to stop - new since yesterday.  No chest pain/palpitations.   Depression: History of major depressive disorder, generalized anxiety disorder treated by counselor Herbie Baltimore mention, last visit February 23.  Psychiatrist Dr. Clovis Pu.   Depression screen [facility] 2/9 11/10/2019 09/01/2019 08/18/2019 03/07/2019 01/31/2019  Decreased Interest 0 0 0 3 3  Down, Depressed, Hopeless 2 0 0 3 3  PHQ - 2 Score 2 0 0 6 6  Altered sleeping 0 - - 2 2  Tired, decreased energy 3 - -  3 3  Change in appetite 3 - - 3 3  Feeling bad or failure about yourself  3 - - 2 0  Trouble concentrating 2 - - 3 3  Moving slowly or fidgety/restless 0 - - 2 3  Suicidal thoughts 0 - - 1 3  PHQ-9 Score 13 - - 22 23  Difficult doing work/chores - - - Very difficult Not difficult at all  Some recent data might be hidden   History Patient Active Problem List   Diagnosis Date Noted  . Wound infection 11/07/2018  . Postoperative pain   . Sleep disturbance   . Tobacco abuse   . Acute blood loss anemia   . Hypoalbuminemia due to protein-calorie malnutrition ([place])   . Debility 10/24/2018  . Pre-operative cardiovascular examination 09/26/2018  . HLD (hyperlipidemia) 09/26/2018  . GAD (generalized anxiety disorder) 08/07/2018  . Major depressive disorder, single episode 08/07/2018  . Appendiceal abscess 07/08/2018  . PVD (peripheral vascular disease) ([place]) 03/15/2018  . Atherosclerosis of native artery of left lower extremity with intermittent claudication ([place]) 03/15/2018  . Chronic left-sided low back pain with left-sided sciatica 12/25/2017  . Facial droop   . History of CVA (cerebrovascular accident) 10/23/2017  . Critical lower limb ischemia 11/08/2016  . Smoker 11/08/2016  . Postinflammatory pulmonary fibrosis (HCC)  02/14/2016  . Cigarette smoker 12/24/2015  . Hypothyroid ? 01/16/2014  . Mild cognitive impairment 01/16/2014  . Meningioma ([place]) 10/21/2013  . Chest pain 10/20/2013  . Medicare annual wellness visit, subsequent 06/16/2013  . Dizziness and giddiness 06/16/2013  . RLS (restless legs syndrome) 04/29/2012  . Abdominal pain 12/27/2010  . DEGENERATIVE JOINT DISEASE 09/23/2010  . CAROTID ARTERY DISEASE 08/15/2010  . CHEST PAIN 08/15/2010  . HEADACHE 12/02/2009  . BACK PAIN 11/22/2009  . CONSTIPATION, CHRONIC 01/29/2008  . NAUSEA 01/28/2008  . DEPRESSION 03/08/2007  . HTN (hypertension) 03/08/2007   Past Medical History:  Diagnosis Date  . AAA (abdominal  aortic aneurysm) (HCC)    3.1 cm 07/08/18, 3 year follow-up recommended; possible 3 cm AAA by aortogram 09/13/18  . Anemia    PMH  . Appendicitis with abscess    07/08/18, s/p perc drain; resolved 07/30/18 by CT  . Arthritis   . Bipolar disorder ([place])   . Cerebrovascular disease    intra and extracranial vascular dx per MRI 4/11, neurology rec strict CVRF control  . Colonic inertia   . Constipation    chronic;severe  . Coronary artery disease   . Depression   . Duodenitis   . EKG abnormalities    changes, stress test neg (false EKG changes)  . Gastritis   . GERD (gastroesophageal reflux disease)   . Hypertension   . Hypothyroid 01/16/2014  . Meningioma ([place]) 10/21/2013  . PAD (peripheral artery disease) ([place])   . Psoriasis    sees derm  . Stroke ([place])   . Wears dentures   . Wears glasses    Past Surgical History:  Procedure Laterality Date  . ABDOMINAL AORTOGRAM W/LOWER EXTREMITY N/A 09/13/2018   Procedure: ABDOMINAL AORTOGRAM W/LOWER EXTREMITY;  Surgeon: Elam Dutch, MD;  Location: [facility];  Service: Cardiovascular;  Laterality: N/A;  . arthroscopy  04/2010   Right knee  . CATARACT EXTRACTION W/ INTRAOCULAR LENS  IMPLANT, BILATERAL    . COLONOSCOPY    . FEMORAL-POPLITEAL BYPASS GRAFT  10/22/2018  . FEMORAL-POPLITEAL BYPASS GRAFT Left 10/22/2018   Procedure: LEFT FEMORAL TO BELOW THE KNEE POPLITEAL ARTERY BYPASS GRAFT;  Surgeon: Elam Dutch, MD;  Location: [facility];  Service: Vascular;  Laterality: Left;  . I & D EXTREMITY Left 11/08/2018   Procedure: IRRIGATION AND DEBRIDEMENT EXTREMITY Left Leg;  Surgeon: Elam Dutch, MD;  Location: [facility];  Service: Vascular;  Laterality: Left;  . IR RADIOLOGIST EVAL & MGMT  07/30/2018  . MULTIPLE TOOTH EXTRACTIONS    . TUBAL LIGATION     No Known Allergies Prior to Admission medications   Medication Sig Start Date End Date Taking? Authorizing Provider  amLODipine (NORVASC) 10 MG tablet Take 1 tablet (10 mg total)  by mouth daily. Patient taking differently: Take 5 mg by mouth daily.  03/07/19  Yes [name], MD  clonazePAM (KLONOPIN) 0.5 MG tablet TAKE 1 TABLET BY MOUTH AT BEDTIME AS NEEDED FOR ANXIETY 10/22/19  Yes Cottle, Billey Co., MD  DULoxetine (CYMBALTA) 30 MG capsule Take 1 capsule (30 mg total) by mouth every evening. 04/15/19  Yes Cottle, Billey Co., MD  famotidine (PEPCID) 20 MG tablet One at bedtime 02/14/16  Yes Tanda Rockers, MD  lamoTRIgine (LAMICTAL) 150 MG tablet TAKE 1 TABLET(150 MG) BY MOUTH TWICE DAILY Patient taking differently: Take 75 mg by mouth daily.  05/23/19  Yes Cottle, Billey Co., MD  linaclotide [facility]) [place]  capsule Take 1 capsule (145 mcg total) by mouth daily before breakfast. 05/14/19  Yes Willia Craze, NP  losartan (COZAAR) 50 MG tablet Take 1 tablet (50 mg total) by mouth daily. 03/07/19  Yes [name], MD  polyethylene glycol powder (GLYCOLAX/MIRALAX) powder Take 17 g by mouth every other day.   Yes [provider]  Probiotic Product (PROBIOTIC-10 ULTIMATE PO) Take by mouth.   Yes [provider]  Rivaroxaban (XARELTO) 15 MG TABS tablet Take 15 mg by mouth 2 (two) times daily with a meal.   Yes [provider]  rivaroxaban (XARELTO) 20 MG TABS tablet Take 1 tablet (20 mg total) by mouth daily with supper. 10/02/19  Yes Fields, Jessy Oto, MD  rOPINIRole (REQUIP) 2 MG tablet TAKE 1 AND 1/2 TABLETS(3 MG) BY MOUTH AT BEDTIME 10/31/19  Yes Cottle, Billey Co., MD  cephALEXin (KEFLEX) 500 MG capsule Take 500 mg by mouth 4 (four) times daily.    [provider]   Social History   Socioeconomic History  . Marital status: Widowed    Spouse name: Not on file  . Number of children: 1  . Years of education: Not on file  . Highest education level: Not on file  Occupational History  . Occupation: retired  Tobacco Use  . Smoking status: Current Every Day Smoker    Packs/day: 0.75    Years: 60.00    Pack years: 45.00     Types: Cigarettes  . Smokeless tobacco: Never Used  Substance and Sexual Activity  . Alcohol use: Yes    Alcohol/week: 0.0 standard drinks    Comment: yes on occassion  . Drug use: No  . Sexual activity: Not Currently  Other Topics Concern  . Not on file  Social History Narrative   Brother in law Mr Rexford Maus (one of my patients)   Lives w/ husband       Social Determinants of Health   Financial Resource Strain:   . Difficulty of Paying Living Expenses: Not on file  Food Insecurity:   . Worried About Charity fundraiser in the Last Year: Not on file  . Ran Out of Food in the Last Year: Not on file  Transportation Needs:   . Lack of Transportation (Medical): Not on file  . Lack of Transportation (Non-Medical): Not on file  Physical Activity:   . Days of Exercise per Week: Not on file  . Minutes of Exercise per Session: Not on file  Stress:   . Feeling of Stress : Not on file  Social Connections:   . Frequency of Communication with Friends and Family: Not on file  . Frequency of Social Gatherings with Friends and Family: Not on file  . Attends Religious Services: Not on file  . Active Member of Clubs or Organizations: Not on file  . Attends Archivist Meetings: Not on file  . Marital Status: Not on file  Intimate Partner Violence:   . Fear of Current or Ex-Partner: Not on file  . Emotionally Abused: Not on file  . Physically Abused: Not on file  . Sexually Abused: Not on file    Review of Systems  Per HPI.  Objective:   Vitals:   11/10/19 1415  BP: 100/64  Pulse: 98  Temp: (!) 97.2 F (36.2 C)  TempSrc: Temporal  SpO2: 97%  Height: 5\' 8"  (1.727 m)     Results for orders placed or performed in visit on 11/10/19  POCT urinalysis dipstick  Result Value Ref Range   Color, UA brown (A) yellow   Clarity, UA cloudy (A) clear   Glucose, UA negative negative mg/dL   Bilirubin, UA negative negative   Ketones, POC UA negative negative mg/dL    Spec Grav, UA >=1.030 (A) 1.010 - 1.025   Blood, UA large (A) negative   pH, UA 6.0 5.0 - 8.0   Protein Ur, POC >=300 (A) negative mg/dL   Urobilinogen, UA 0.2 0.2 or 1.0 E.U./dL   Nitrite, UA Negative Negative   Leukocytes, UA Large (3+) (A) Negative  POCT Microscopic Urinalysis ([facility])  Result Value Ref Range   WBC,UR,HPF,POC Too numerous to count  (A) None WBC/hpf   RBC,UR,HPF,POC Many (A) None RBC/hpf   Bacteria None None, Too numerous to count   Mucus Absent Absent   Epithelial Cells, UR Per Microscopy Few (A) None, Too numerous to count cells/hpf     Physical Exam Vitals reviewed.  Constitutional:      General: She is not in acute distress.    Appearance: She is well-developed. She is ill-appearing (apperas fatigued, responding appropriately). She is not diaphoretic.  HENT:     Head: Normocephalic and atraumatic.  Eyes:     Conjunctiva/sclera: Conjunctivae normal.     Pupils: Pupils are equal, round, and reactive to light.  Neck:     Vascular: No carotid bruit.  Cardiovascular:     Rate and Rhythm: Normal rate and regular rhythm.     Heart sounds: Murmur (1-2 + systolic. ) present.  Pulmonary:     Effort: Pulmonary effort is normal.     Breath sounds: Rales (He distant rales right lower lobe greater than left. Shortness sentences but no respiratory distress at rest.) present.  Abdominal:     Palpations: Abdomen is soft. There is no pulsatile mass.     Tenderness: There is abdominal tenderness in the suprapubic area. There is no right CVA tenderness (left lateral flank -msk soreness,cva nontender. ), left CVA tenderness, guarding or rebound.  Skin:    General: Skin is warm and dry.  Neurological:     Mental Status: She is alert and oriented to person, place, and time.  Psychiatric:        Behavior: Behavior normal.    Results for orders placed or performed in visit on 11/10/19  POCT urinalysis dipstick  Result Value Ref Range   Color, UA brown (A) yellow    Clarity, UA cloudy (A) clear   Glucose, UA negative negative mg/dL   Bilirubin, UA negative negative   Ketones, POC UA negative negative mg/dL   Spec Grav, UA >=1.030 (A) 1.010 - 1.025   Blood, UA large (A) negative   pH, UA 6.0 5.0 - 8.0   Protein Ur, POC >=300 (A) negative mg/dL   Urobilinogen, UA 0.2 0.2 or 1.0 E.U./dL   Nitrite, UA Negative Negative   Leukocytes, UA Large (3+) (A) Negative  POCT Microscopic Urinalysis ([facility])  Result Value Ref Range   WBC,UR,HPF,POC Too numerous to count  (A) None WBC/hpf   RBC,UR,HPF,POC Many (A) None RBC/hpf   Bacteria None None, Too numerous to count   Mucus Absent Absent   Epithelial Cells, UR Per Microscopy Few (A) None, Too numerous to count cells/hpf  POCT CBC  Result Value Ref Range   WBC 16.5 (A) 4.6 - 10.2 K/uL   Lymph, poc 1.0 0.6 - 3.4   POC LYMPH PERCENT 5.9 (A) 10 - 50 %L  MID (cbc) 0.7 0 - 0.9   POC MID % 4.2 0 - 12 %M   POC Granulocyte 14.8 (A) 2 - 6.9   Granulocyte percent 89.9 (A) 37 - 80 %G   RBC 4.17 4.04 - 5.48 M/uL   Hemoglobin 12.4 11 - 14.6 g/dL   HCT, POC 37.5 29 - 41 %   MCV 89.8 76 - 111 fL   MCH, POC 29.7 27 - 31.2 pg   MCHC 33.1 31.8 - 35.4 g/dL   RDW, POC 14.4 %   Platelet Count, POC 239 142 - 424 K/uL   MPV 6.2 0 - 99.8 fL  POCT glucose (manual entry)  Result Value Ref Range   POC Glucose 138 (A) 70 - 99 mg/dl    EKG: SR, rate 86, no apparent acute findings.   Assessment & Plan:  [name] is a 74 y.o. female . Lower abdominal pain - Plan: POCT urinalysis dipstick, POCT Microscopic Urinalysis ([facility])  Shaking chills  Hematuria, unspecified type - Plan: POCT CBC  Fatigue, unspecified type - Plan: POCT CBC, POCT glucose (manual entry), EKG 12-Lead, Comprehensive metabolic panel  Dark urine - Plan: Comprehensive metabolic panel  Decreased urine output  Flank pain  DOE (dyspnea on exertion) - Plan: POCT CBC  Cough - Plan: POCT CBC  Chronic cough with history of COPD but does report  increasing cough past week or so.  Additionally increasing dyspnea exertion more so since yesterday.  Shaking chills last night, low-grade temp.  Most recent symptoms with abdominal pain starting 10 days ago, dysuria, hematuria, now with decreased urine output, dark urine, shaking chills as above.  Concerning for urinary tract infection and possible urosepsis. Additionally with decreased urine output, dark urine, differential includes acute kidney injury Less likely COVID-19,  pneumonia but could certainly have multiple conditions with progression of symptoms as above.  Deferred covid test and CXR to ER eval today.   -Emergency room evaluation recommended at this time.  EMS transport offered, but she will have transport via private vehicle with family member. Marland Kitchen  [facility] triage nurse advised.   No orders of the defined types were placed in this encounter.  Patient Instructions    It does appear that you have a urinary tract infection, but based on your symptoms and labs today I am concerned that you could have a bloodstream infection.  Proceed directly to the emergency room for further testing and evaluation.   If you have lab work done today you will be contacted with your lab results within the next 2 weeks.  If you have not heard from [place] then please contact us. The fastest way to get your results is to register for My Chart.   IF you received an x-ray today, you will receive an invoice from [facility]. Please contact [facility] at [phone] with questions or concerns regarding your invoice.   IF you received labwork today, you will receive an invoice from [place]. Please contact [facility] at [phone] with questions or concerns regarding your invoice.   Our billing staff will not be able to assist you with questions regarding bills from these companies.  You will be contacted with the lab results as soon as they are available. The fastest way to get your results is to  activate your My Chart account. Instructions are located on the last page of this paperwork. If you have not heard from [place] regarding the results in 2 weeks, please contact this office.  Signed, Merri Ray, MD [facility]

## 2019-11-10 NOTE — ED Triage Notes (Signed)
Pt arrives to ED from her PCP to rule out urosepsis due to +UTI with 10 days of fatigue flank pain and now having dark urine with decreased urine out put and chills.

## 2019-11-10 NOTE — Patient Instructions (Addendum)
  It does appear that you have a urinary tract infection, but based on your symptoms and labs today I am concerned that you could have a bloodstream infection.  Proceed directly to the emergency room for further testing and evaluation.   If you have lab work done today you will be contacted with your lab results within the next 2 weeks.  If you have not heard from [place] then please contact us. The fastest way to get your results is to register for My Chart.   IF you received an x-ray today, you will receive an invoice from [facility]. Please contact [facility] at [phone] with questions or concerns regarding your invoice.   IF you received labwork today, you will receive an invoice from [place]. Please contact [facility] at [phone] with questions or concerns regarding your invoice.   Our billing staff will not be able to assist you with questions regarding bills from these companies.  You will be contacted with the lab results as soon as they are available. The fastest way to get your results is to activate your My Chart account. Instructions are located on the last page of this paperwork. If you have not heard from [place] regarding the results in 2 weeks, please contact this office.

## 2019-11-11 ENCOUNTER — Inpatient Hospital Stay (HOSPITAL_COMMUNITY): Payer: Medicare Other

## 2019-11-11 ENCOUNTER — Encounter (HOSPITAL_COMMUNITY): Payer: Self-pay | Admitting: Emergency Medicine

## 2019-11-11 DIAGNOSIS — E871 Hypo-osmolality and hyponatremia: Secondary | ICD-10-CM | POA: Diagnosis not present

## 2019-11-11 DIAGNOSIS — R918 Other nonspecific abnormal finding of lung field: Secondary | ICD-10-CM | POA: Diagnosis not present

## 2019-11-11 DIAGNOSIS — I110 Hypertensive heart disease with heart failure: Secondary | ICD-10-CM | POA: Diagnosis present

## 2019-11-11 DIAGNOSIS — I739 Peripheral vascular disease, unspecified: Secondary | ICD-10-CM | POA: Diagnosis present

## 2019-11-11 DIAGNOSIS — E440 Moderate protein-calorie malnutrition: Secondary | ICD-10-CM | POA: Diagnosis present

## 2019-11-11 DIAGNOSIS — N390 Urinary tract infection, site not specified: Secondary | ICD-10-CM

## 2019-11-11 DIAGNOSIS — R6521 Severe sepsis with septic shock: Secondary | ICD-10-CM | POA: Diagnosis not present

## 2019-11-11 DIAGNOSIS — Z20822 Contact with and (suspected) exposure to covid-19: Secondary | ICD-10-CM | POA: Diagnosis not present

## 2019-11-11 DIAGNOSIS — Z9981 Dependence on supplemental oxygen: Secondary | ICD-10-CM | POA: Diagnosis not present

## 2019-11-11 DIAGNOSIS — I10 Essential (primary) hypertension: Secondary | ICD-10-CM | POA: Diagnosis not present

## 2019-11-11 DIAGNOSIS — I714 Abdominal aortic aneurysm, without rupture: Secondary | ICD-10-CM | POA: Diagnosis not present

## 2019-11-11 DIAGNOSIS — N19 Unspecified kidney failure: Secondary | ICD-10-CM | POA: Diagnosis not present

## 2019-11-11 DIAGNOSIS — Z6824 Body mass index (BMI) 24.0-24.9, adult: Secondary | ICD-10-CM | POA: Diagnosis not present

## 2019-11-11 DIAGNOSIS — G2581 Restless legs syndrome: Secondary | ICD-10-CM | POA: Diagnosis present

## 2019-11-11 DIAGNOSIS — N281 Cyst of kidney, acquired: Secondary | ICD-10-CM | POA: Diagnosis not present

## 2019-11-11 DIAGNOSIS — F319 Bipolar disorder, unspecified: Secondary | ICD-10-CM | POA: Diagnosis not present

## 2019-11-11 DIAGNOSIS — J84112 Idiopathic pulmonary fibrosis: Secondary | ICD-10-CM | POA: Diagnosis present

## 2019-11-11 DIAGNOSIS — E039 Hypothyroidism, unspecified: Secondary | ICD-10-CM | POA: Diagnosis not present

## 2019-11-11 DIAGNOSIS — K589 Irritable bowel syndrome without diarrhea: Secondary | ICD-10-CM | POA: Diagnosis not present

## 2019-11-11 DIAGNOSIS — F17210 Nicotine dependence, cigarettes, uncomplicated: Secondary | ICD-10-CM | POA: Diagnosis present

## 2019-11-11 DIAGNOSIS — J449 Chronic obstructive pulmonary disease, unspecified: Secondary | ICD-10-CM | POA: Diagnosis not present

## 2019-11-11 DIAGNOSIS — J9600 Acute respiratory failure, unspecified whether with hypoxia or hypercapnia: Secondary | ICD-10-CM | POA: Diagnosis not present

## 2019-11-11 DIAGNOSIS — R109 Unspecified abdominal pain: Secondary | ICD-10-CM | POA: Diagnosis not present

## 2019-11-11 DIAGNOSIS — I5033 Acute on chronic diastolic (congestive) heart failure: Secondary | ICD-10-CM | POA: Diagnosis not present

## 2019-11-11 DIAGNOSIS — A4151 Sepsis due to Escherichia coli [E. coli]: Secondary | ICD-10-CM | POA: Diagnosis not present

## 2019-11-11 DIAGNOSIS — F05 Delirium due to known physiological condition: Secondary | ICD-10-CM | POA: Diagnosis not present

## 2019-11-11 DIAGNOSIS — D638 Anemia in other chronic diseases classified elsewhere: Secondary | ICD-10-CM | POA: Diagnosis present

## 2019-11-11 DIAGNOSIS — R6520 Severe sepsis without septic shock: Secondary | ICD-10-CM | POA: Diagnosis not present

## 2019-11-11 DIAGNOSIS — A419 Sepsis, unspecified organism: Secondary | ICD-10-CM | POA: Diagnosis not present

## 2019-11-11 DIAGNOSIS — N170 Acute kidney failure with tubular necrosis: Secondary | ICD-10-CM | POA: Diagnosis not present

## 2019-11-11 DIAGNOSIS — R079 Chest pain, unspecified: Secondary | ICD-10-CM | POA: Diagnosis not present

## 2019-11-11 DIAGNOSIS — E872 Acidosis: Secondary | ICD-10-CM | POA: Diagnosis not present

## 2019-11-11 DIAGNOSIS — J181 Lobar pneumonia, unspecified organism: Secondary | ICD-10-CM | POA: Diagnosis not present

## 2019-11-11 DIAGNOSIS — I2510 Atherosclerotic heart disease of native coronary artery without angina pectoris: Secondary | ICD-10-CM | POA: Diagnosis present

## 2019-11-11 DIAGNOSIS — R7989 Other specified abnormal findings of blood chemistry: Secondary | ICD-10-CM

## 2019-11-11 DIAGNOSIS — Z4682 Encounter for fitting and adjustment of non-vascular catheter: Secondary | ICD-10-CM | POA: Diagnosis not present

## 2019-11-11 DIAGNOSIS — N179 Acute kidney failure, unspecified: Secondary | ICD-10-CM

## 2019-11-11 DIAGNOSIS — M2550 Pain in unspecified joint: Secondary | ICD-10-CM | POA: Diagnosis not present

## 2019-11-11 DIAGNOSIS — J9601 Acute respiratory failure with hypoxia: Secondary | ICD-10-CM | POA: Diagnosis not present

## 2019-11-11 DIAGNOSIS — J439 Emphysema, unspecified: Secondary | ICD-10-CM | POA: Diagnosis not present

## 2019-11-11 DIAGNOSIS — Z7952 Long term (current) use of systemic steroids: Secondary | ICD-10-CM | POA: Diagnosis not present

## 2019-11-11 DIAGNOSIS — E876 Hypokalemia: Secondary | ICD-10-CM | POA: Diagnosis not present

## 2019-11-11 DIAGNOSIS — F419 Anxiety disorder, unspecified: Secondary | ICD-10-CM | POA: Diagnosis not present

## 2019-11-11 DIAGNOSIS — R0902 Hypoxemia: Secondary | ICD-10-CM | POA: Diagnosis not present

## 2019-11-11 DIAGNOSIS — G9341 Metabolic encephalopathy: Secondary | ICD-10-CM | POA: Diagnosis not present

## 2019-11-11 DIAGNOSIS — Z7401 Bed confinement status: Secondary | ICD-10-CM | POA: Diagnosis not present

## 2019-11-11 DIAGNOSIS — K582 Mixed irritable bowel syndrome: Secondary | ICD-10-CM | POA: Diagnosis present

## 2019-11-11 DIAGNOSIS — N12 Tubulo-interstitial nephritis, not specified as acute or chronic: Secondary | ICD-10-CM | POA: Diagnosis not present

## 2019-11-11 DIAGNOSIS — D649 Anemia, unspecified: Secondary | ICD-10-CM | POA: Diagnosis not present

## 2019-11-11 DIAGNOSIS — R4182 Altered mental status, unspecified: Secondary | ICD-10-CM | POA: Diagnosis not present

## 2019-11-11 DIAGNOSIS — F329 Major depressive disorder, single episode, unspecified: Secondary | ICD-10-CM | POA: Diagnosis not present

## 2019-11-11 DIAGNOSIS — Z8673 Personal history of transient ischemic attack (TIA), and cerebral infarction without residual deficits: Secondary | ICD-10-CM | POA: Diagnosis not present

## 2019-11-11 DIAGNOSIS — K219 Gastro-esophageal reflux disease without esophagitis: Secondary | ICD-10-CM | POA: Diagnosis present

## 2019-11-11 DIAGNOSIS — Z9911 Dependence on respirator [ventilator] status: Secondary | ICD-10-CM | POA: Diagnosis not present

## 2019-11-11 DIAGNOSIS — R531 Weakness: Secondary | ICD-10-CM | POA: Diagnosis not present

## 2019-11-11 LAB — COMPREHENSIVE METABOLIC PANEL
ALT: 132 IU/L — ABNORMAL HIGH (ref 0–32)
AST: 48 IU/L — ABNORMAL HIGH (ref 0–40)
Albumin/Globulin Ratio: 1.3 (ref 1.2–2.2)
Albumin: 3.7 g/dL (ref 3.7–4.7)
Alkaline Phosphatase: 160 IU/L — ABNORMAL HIGH (ref 39–117)
BUN/Creatinine Ratio: 12 (ref 12–28)
BUN: 26 mg/dL (ref 8–27)
Bilirubin Total: 0.5 mg/dL (ref 0.0–1.2)
CO2: 19 mmol/L — ABNORMAL LOW (ref 20–29)
Calcium: 8.7 mg/dL (ref 8.7–10.3)
Chloride: 91 mmol/L — ABNORMAL LOW (ref 96–106)
Creatinine, Ser: 2.18 mg/dL — ABNORMAL HIGH (ref 0.57–1.00)
GFR calc Af Amer: 25 mL/min/{1.73_m2} — ABNORMAL LOW (ref >59)
GFR calc non Af Amer: 21 mL/min/{1.73_m2} — ABNORMAL LOW (ref >59)
Globulin, Total: 2.8 g/dL (ref 1.5–4.5)
Glucose: 111 mg/dL — ABNORMAL HIGH (ref 65–99)
Potassium: 3.9 mmol/L (ref 3.5–5.2)
Sodium: 128 mmol/L — ABNORMAL LOW (ref 134–144)
Total Protein: 6.5 g/dL (ref 6.0–8.5)

## 2019-11-11 LAB — BLOOD CULTURE ID PANEL (REFLEXED)

## 2019-11-11 LAB — BASIC METABOLIC PANEL
Anion gap: 13 (ref 5–15)
BUN: 43 mg/dL — ABNORMAL HIGH (ref 8–23)
CO2: 19 mmol/L — ABNORMAL LOW (ref 22–32)
Calcium: 8.2 mg/dL — ABNORMAL LOW (ref 8.9–10.3)
Chloride: 95 mmol/L — ABNORMAL LOW (ref 98–111)
Creatinine, Ser: 3.71 mg/dL — ABNORMAL HIGH (ref 0.44–1.00)
GFR calc Af Amer: 13 mL/min — ABNORMAL LOW (ref >60)
GFR calc non Af Amer: 11 mL/min — ABNORMAL LOW (ref >60)
Glucose, Bld: 108 mg/dL — ABNORMAL HIGH (ref 70–99)
Potassium: 3.9 mmol/L (ref 3.5–5.1)
Sodium: 127 mmol/L — ABNORMAL LOW (ref 135–145)

## 2019-11-11 LAB — SARS CORONAVIRUS 2 (TAT 6-24 HRS): SARS Coronavirus 2: NEGATIVE

## 2019-11-11 MED ORDER — SODIUM CHLORIDE 0.9 % IV SOLN
2.0000 g | INTRAVENOUS | Status: DC
  Administered 2019-11-11 – 2019-11-16 (×6): 2 g via INTRAVENOUS
  Filled 2019-11-11 (×2): qty 2
  Filled 2019-11-11: qty 20
  Filled 2019-11-11 (×4): qty 2

## 2019-11-11 MED ORDER — LORAZEPAM 2 MG/ML IJ SOLN
1.0000 mg | Freq: Once | INTRAMUSCULAR | Status: AC
  Administered 2019-11-11: 1 mg via INTRAVENOUS
  Filled 2019-11-11: qty 1

## 2019-11-11 MED ORDER — MELATONIN 3 MG PO TABS
18.0000 mg | ORAL_TABLET | Freq: Every day | ORAL | Status: DC
  Administered 2019-11-11 – 2019-11-26 (×12): 18 mg via ORAL
  Filled 2019-11-11 (×18): qty 6

## 2019-11-11 MED ORDER — SODIUM CHLORIDE 0.9 % IV SOLN
INTRAVENOUS | Status: DC
  Administered 2019-11-11: 125 mL/h via INTRAVENOUS

## 2019-11-11 MED ORDER — ROPINIROLE HCL 1 MG PO TABS
3.0000 mg | ORAL_TABLET | Freq: Every day | ORAL | Status: DC
  Administered 2019-11-11 – 2019-11-26 (×14): 3 mg via ORAL
  Filled 2019-11-11 (×17): qty 3

## 2019-11-11 MED ORDER — ROPINIROLE HCL 1 MG PO TABS
3.0000 mg | ORAL_TABLET | Freq: Once | ORAL | Status: AC
  Administered 2019-11-11: 3 mg via ORAL
  Filled 2019-11-11: qty 3

## 2019-11-11 MED ORDER — AMLODIPINE BESYLATE 10 MG PO TABS
10.0000 mg | ORAL_TABLET | Freq: Every day | ORAL | Status: DC
  Administered 2019-11-11: 10 mg via ORAL
  Filled 2019-11-11: qty 1

## 2019-11-11 MED ORDER — ONDANSETRON HCL 4 MG/2ML IJ SOLN
4.0000 mg | Freq: Four times a day (QID) | INTRAMUSCULAR | Status: DC | PRN
  Administered 2019-11-11 – 2019-11-23 (×5): 4 mg via INTRAVENOUS
  Filled 2019-11-11 (×5): qty 2

## 2019-11-11 MED ORDER — SODIUM CHLORIDE 0.9 % IV BOLUS
1000.0000 mL | Freq: Once | INTRAVENOUS | Status: AC
  Administered 2019-11-11: 1000 mL via INTRAVENOUS

## 2019-11-11 MED ORDER — CLONAZEPAM 0.5 MG PO TABS
0.5000 mg | ORAL_TABLET | Freq: Two times a day (BID) | ORAL | Status: DC | PRN
  Administered 2019-11-11 – 2019-11-27 (×7): 0.5 mg via ORAL
  Filled 2019-11-11 (×12): qty 1

## 2019-11-11 MED ORDER — SODIUM CHLORIDE 0.9 % IV SOLN
1.0000 g | INTRAVENOUS | Status: DC
  Filled 2019-11-11: qty 10

## 2019-11-11 MED ORDER — SODIUM CHLORIDE 0.9 % IV SOLN
1.0000 g | Freq: Once | INTRAVENOUS | Status: AC
  Administered 2019-11-11: 1 g via INTRAVENOUS
  Filled 2019-11-11: qty 10

## 2019-11-11 MED ORDER — ACETAMINOPHEN 325 MG PO TABS
650.0000 mg | ORAL_TABLET | Freq: Four times a day (QID) | ORAL | Status: DC | PRN
  Administered 2019-11-11 – 2019-11-21 (×3): 650 mg via ORAL
  Filled 2019-11-11 (×3): qty 2

## 2019-11-11 MED ORDER — SODIUM CHLORIDE 0.9 % IV SOLN: INTRAVENOUS | Status: DC

## 2019-11-11 MED ORDER — FAMOTIDINE 20 MG PO TABS
20.0000 mg | ORAL_TABLET | Freq: Every day | ORAL | Status: DC
  Administered 2019-11-11 – 2019-11-24 (×11): 20 mg via ORAL
  Filled 2019-11-11 (×14): qty 1

## 2019-11-11 MED ORDER — LINACLOTIDE 145 MCG PO CAPS
145.0000 ug | ORAL_CAPSULE | Freq: Every day | ORAL | Status: DC
  Administered 2019-11-12 – 2019-11-27 (×14): 145 ug via ORAL
  Filled 2019-11-11 (×19): qty 1

## 2019-11-11 MED ORDER — LAMOTRIGINE 25 MG PO TABS
75.0000 mg | ORAL_TABLET | Freq: Every day | ORAL | Status: DC
  Administered 2019-11-11 – 2019-11-27 (×15): 75 mg via ORAL
  Filled 2019-11-11 (×16): qty 3

## 2019-11-11 MED ORDER — MORPHINE SULFATE (PF) 2 MG/ML IV SOLN
1.0000 mg | INTRAVENOUS | Status: DC | PRN
  Administered 2019-11-12 – 2019-11-13 (×3): 1 mg via INTRAVENOUS
  Filled 2019-11-11 (×3): qty 1

## 2019-11-11 NOTE — ED Notes (Signed)
Pt called out, stating she was uncomfortable and in pain. Pt informed that it would be a few minutes before her nurse could be in to attend to her needs as she was with another patient. Per RN, Pt has been informed by RN that no pain medication has been ordered, but RN is aware of pts continued sentiments.

## 2019-11-11 NOTE — ED Notes (Signed)
Patient wants to go to bathroom.  UP to wheelchair and taken to bathroom.

## 2019-11-11 NOTE — Progress Notes (Signed)
PHARMACY - PHYSICIAN COMMUNICATION CRITICAL VALUE ALERT - BLOOD CULTURE IDENTIFICATION (BCID)  [name] is an 74 y.o. female who presented to [facility] on 11/10/2019 with a chief complaint of complicated UTI.  Assessment: 1 set of blood cx drawn growing GNR in 2/2 bottles. BCID resulted with E. coli, no KPC detected. No hx of ESBL E. coli.  Name of physician (or Provider) Contacted: Dr. Cathlean Sauer  Current antibiotics: Ceftriaxone 1 gm IV q24h  Changes to prescribed antibiotics recommended: Increase ceftriaxone to 2 gm IV q24h for bacteremia  Results for orders placed or performed during the hospital encounter of 11/10/19  Blood Culture ID Panel (Reflexed) (Collected: 11/10/2019  4:53 PM)  Result Value Ref Range   Enterococcus species NOT DETECTED NOT DETECTED   Listeria monocytogenes NOT DETECTED NOT DETECTED   Staphylococcus species NOT DETECTED NOT DETECTED   Staphylococcus aureus (BCID) NOT DETECTED NOT DETECTED   Streptococcus species NOT DETECTED NOT DETECTED   Streptococcus agalactiae NOT DETECTED NOT DETECTED   Streptococcus pneumoniae NOT DETECTED NOT DETECTED   Streptococcus pyogenes NOT DETECTED NOT DETECTED   Acinetobacter baumannii NOT DETECTED NOT DETECTED   Enterobacteriaceae species DETECTED (A) NOT DETECTED   Enterobacter cloacae complex NOT DETECTED NOT DETECTED   Escherichia coli DETECTED (A) NOT DETECTED   Klebsiella oxytoca NOT DETECTED NOT DETECTED   Klebsiella pneumoniae NOT DETECTED NOT DETECTED   Proteus species NOT DETECTED NOT DETECTED   Serratia marcescens NOT DETECTED NOT DETECTED   Carbapenem resistance NOT DETECTED NOT DETECTED   Haemophilus influenzae NOT DETECTED NOT DETECTED   Neisseria meningitidis NOT DETECTED NOT DETECTED   Pseudomonas aeruginosa NOT DETECTED NOT DETECTED   Candida albicans NOT DETECTED NOT DETECTED   Candida glabrata NOT DETECTED NOT DETECTED   Candida krusei NOT DETECTED NOT DETECTED   Candida parapsilosis NOT DETECTED NOT  DETECTED   Candida tropicalis NOT DETECTED NOT DETECTED    Berenice Bouton, PharmD PGY1 Pharmacy Resident  Please check AMION for all [place] phone numbers After 10:00 PM, call [place] [phone]  11/11/2019  9:20 AM

## 2019-11-11 NOTE — ED Notes (Signed)
Pt offered a bed pain to have a bowel movement, but pt refused. Pt stating, "I'm not going to go until I get a potty. I'll just sit here and be constipated."

## 2019-11-11 NOTE — ED Notes (Signed)
Dr. Marlowe Sax paged about patient requesting pain meds.

## 2019-11-11 NOTE — H&P (Signed)
History and Physical    [name] EML:[id number] DOB: [birth year] DOA: 11/10/2019  PCP: [name], MD Patient coming from: Home  Chief Complaint: UTI  HPI: [name] is a 74 y.o. female with medical history significant of AAA, bipolar disorder, cerebrovascular disease, CAD, depression, GERD, gastritis, hypertension, hypothyroidism, meningioma, PAD, psoriasis, CVA, and conditions listed below being sent to the ED by her PCP for evaluation of severe UTI symptoms and chills.  Patient reports 1 week history of dysuria, urinary frequency/urgency, suprapubic pain, left-sided flank pain, and chills.  She has been noticing blood in her urine and feels it is very foul-smelling.  Yesterday she had a low-grade fever.  She has been feeling nauseous but has not vomited.  Endorses decreased p.o. intake.  No other complaints.  ED Course: Afebrile.  Not tachycardic or hypotensive.  WBC count 17.1.  Lactic acid normal.  Corrected sodium 128.  BUN 27, creatinine 2.5.  Baseline creatinine 0.8.  AST 51, ALT 137, alk phos 136.  T bili normal.  UA showing large amount of leukocytes, large amount of blood.  Urine microscopic examination showing WBCs too numerous to count, many RBCs, and no bacteria.  Blood culture x2 pending. Chest x-ray showing findings compatible with interstitial lung disease and no acute cardiopulmonary abnormality. Patient was started on IV fluid infusion and received ceftriaxone.  Review of Systems:  All systems reviewed and apart from history of presenting illness, are negative.  Past Medical History:  Diagnosis Date  . AAA (abdominal aortic aneurysm) (HCC)    3.1 cm 07/08/18, 3 year follow-up recommended; possible 3 cm AAA by aortogram 09/13/18  . Anemia    PMH  . Appendicitis with abscess    07/08/18, s/p perc drain; resolved 07/30/18 by CT  . Arthritis   . Bipolar disorder ([place])   . Cerebrovascular disease    intra and extracranial vascular dx per MRI 4/11, neurology rec  strict CVRF control  . Colonic inertia   . Constipation    chronic;severe  . Coronary artery disease   . Depression   . Duodenitis   . EKG abnormalities    changes, stress test neg (false EKG changes)  . Gastritis   . GERD (gastroesophageal reflux disease)   . Hypertension   . Hypothyroid 01/16/2014  . Meningioma ([place]) 10/21/2013  . PAD (peripheral artery disease) ([place])   . Psoriasis    sees derm  . Stroke ([place])   . Wears dentures   . Wears glasses     Past Surgical History:  Procedure Laterality Date  . ABDOMINAL AORTOGRAM W/LOWER EXTREMITY N/A 09/13/2018   Procedure: ABDOMINAL AORTOGRAM W/LOWER EXTREMITY;  Surgeon: Elam Dutch, MD;  Location: [place] [facility];  Service: Cardiovascular;  Laterality: N/A;  . arthroscopy  04/2010   Right knee  . CATARACT EXTRACTION W/ INTRAOCULAR LENS  IMPLANT, BILATERAL    . COLONOSCOPY    . FEMORAL-POPLITEAL BYPASS GRAFT  10/22/2018  . FEMORAL-POPLITEAL BYPASS GRAFT Left 10/22/2018   Procedure: LEFT FEMORAL TO BELOW THE KNEE POPLITEAL ARTERY BYPASS GRAFT;  Surgeon: Elam Dutch, MD;  Location: [place];  Service: Vascular;  Laterality: Left;  . I & D EXTREMITY Left 11/08/2018   Procedure: IRRIGATION AND DEBRIDEMENT EXTREMITY Left Leg;  Surgeon: Elam Dutch, MD;  Location: [place];  Service: Vascular;  Laterality: Left;  . IR RADIOLOGIST EVAL & MGMT  07/30/2018  . MULTIPLE TOOTH EXTRACTIONS    . TUBAL LIGATION  reports that she has been smoking cigarettes. She has a 45.00 pack-year smoking history. She has never used smokeless tobacco. She reports current alcohol use. She reports that she does not use drugs.  No Known Allergies  Family History  Problem Relation Age of Onset  . Breast cancer Mother        metastisis to bones  . Diabetes Son   . Heart disease Other        grandfather   . Alcohol abuse Brother   . Heart disease Brother   . Heart disease Maternal Aunt   . Lung cancer Brother        smoked  . Colon cancer  Neg Hx     Prior to Admission medications   Medication Sig Start Date End Date Taking? Authorizing Provider  amLODipine (NORVASC) 10 MG tablet Take 1 tablet (10 mg total) by mouth daily. 03/07/19  Yes [name], MD  DULoxetine (CYMBALTA) 30 MG capsule Take 1 capsule (30 mg total) by mouth every evening. 04/15/19  Yes Cottle, Billey Co., MD  famotidine (PEPCID) 20 MG tablet One at bedtime Patient taking differently: Take 20 mg by mouth at bedtime. One at bedtime 02/14/16  Yes Tanda Rockers, MD  lamoTRIgine (LAMICTAL) 150 MG tablet TAKE 1 TABLET(150 MG) BY MOUTH TWICE DAILY Patient taking differently: Take 75 mg by mouth daily.  05/23/19  Yes Cottle, Billey Co., MD  linaclotide [facility]) 145 MCG CAPS capsule Take 1 capsule (145 mcg total) by mouth daily before breakfast. 05/14/19  Yes Willia Craze, NP  losartan (COZAAR) 50 MG tablet Take 1 tablet (50 mg total) by mouth daily. 03/07/19  Yes [name], MD  Melatonin 10 MG TABS Take 20 mg by mouth at bedtime.   Yes [provider]  polyethylene glycol powder (GLYCOLAX/MIRALAX) powder Take 17 g by mouth daily as needed for moderate constipation.    Yes [provider]  rOPINIRole (REQUIP) 2 MG tablet TAKE 1 AND 1/2 TABLETS(3 MG) BY MOUTH AT BEDTIME Patient taking differently: Take 3 mg by mouth at bedtime.  10/31/19  Yes Cottle, Billey Co., MD  clonazePAM (KLONOPIN) 0.5 MG tablet TAKE 1 TABLET BY MOUTH AT BEDTIME AS NEEDED FOR ANXIETY Patient taking differently: Take 0.5 mg by mouth 2 (two) times daily as needed for anxiety. TAKE 1 TABLET BY MOUTH AT BEDTIME FOR ANXIETY 10/22/19   Cottle, Billey Co., MD  rivaroxaban (XARELTO) 20 MG TABS tablet Take 1 tablet (20 mg total) by mouth daily with supper. 10/02/19   Elam Dutch, MD    Physical Exam: Vitals:   11/11/19 0200 11/11/19 0300 11/11/19 0400 11/11/19 0527  BP: 118/60 131/70 127/67 131/61  Pulse: 93 99 94 96  Resp: '20 20 20 20  ' Temp:    98.9 F (37.2 C)    TempSrc:    Oral  SpO2: 94% 94% 94% 100%  Weight:      Height:        Physical Exam  Constitutional: She is oriented to person, place, and time. She appears well-developed and well-nourished. No distress.  HENT:  Head: Normocephalic.  Very dry mucous membranes  Eyes: Right eye exhibits no discharge. Left eye exhibits no discharge.  Cardiovascular: Normal rate, regular rhythm and intact distal pulses.  Pulmonary/Chest: Effort normal and breath sounds normal. No respiratory distress. She has no wheezes. She has no rales.  Abdominal: Soft. Bowel sounds are normal. She exhibits no distension. There is no  abdominal tenderness. There is no guarding.  Genitourinary:    Genitourinary Comments: Left CVA tenderness   Musculoskeletal:        General: No edema.     Cervical back: Neck supple.  Neurological: She is alert and oriented to person, place, and time.  Skin: Skin is warm and dry. She is not diaphoretic.     Labs on Admission: I have personally reviewed following labs and imaging studies  CBC: Recent Labs  Lab 11/10/19 1512 11/10/19 1626  WBC 16.5* 17.1*  NEUTROABS  --  15.6*  HGB 12.4 12.3  HCT 37.5 38.2  MCV 89.8 92.3  PLT  --  196   Basic Metabolic Panel: Recent Labs  Lab 11/10/19 1514 11/10/19 1626  NA 128* 127*  K 3.9 4.1  CL 91* 93*  CO2 19* 22  GLUCOSE 111* 154*  BUN 26 27*  CREATININE 2.18* 2.59*  CALCIUM 8.7 8.9   GFR: Estimated Creatinine Clearance: 18.6 mL/min (A) (by C-G formula based on SCr of 2.59 mg/dL (H)). Liver Function Tests: Recent Labs  Lab 11/10/19 1514 11/10/19 1626  AST 48* 51*  ALT 132* 137*  ALKPHOS 160* 136*  BILITOT 0.5 1.1  PROT 6.5 7.4  ALBUMIN 3.7 3.4*   No results for input(s): LIPASE, AMYLASE in the last 168 hours. No results for input(s): AMMONIA in the last 168 hours. Coagulation Profile: Recent Labs  Lab 11/10/19 1626  INR 1.5*   Cardiac Enzymes: No results for input(s): CKTOTAL, CKMB, CKMBINDEX, TROPONINI  in the last 168 hours. BNP (last 3 results) Recent Labs    03/10/19 1438  PROBNP 263   HbA1C: No results for input(s): HGBA1C in the last 72 hours. CBG: No results for input(s): GLUCAP in the last 168 hours. Lipid Profile: No results for input(s): CHOL, HDL, LDLCALC, TRIG, CHOLHDL, LDLDIRECT in the last 72 hours. Thyroid Function Tests: No results for input(s): TSH, T4TOTAL, FREET4, T3FREE, THYROIDAB in the last 72 hours. Anemia Panel: No results for input(s): VITAMINB12, FOLATE, FERRITIN, TIBC, IRON, RETICCTPCT in the last 72 hours. Urine analysis:    Component Value Date/Time   COLORURINE YELLOW 11/06/2018 2040   APPEARANCEUR CLEAR 11/06/2018 2040   LABSPEC 1.006 11/06/2018 2040   PHURINE 6.0 11/06/2018 2040   GLUCOSEU NEGATIVE 11/06/2018 2040   HGBUR SMALL (A) 11/06/2018 2040   BILIRUBINUR negative 11/10/2019 1432   BILIRUBINUR neg 07/31/2013 1752   KETONESUR negative 11/10/2019 1432   KETONESUR NEGATIVE 11/06/2018 2040   PROTEINUR >=300 (A) 11/10/2019 1432   PROTEINUR NEGATIVE 11/06/2018 2040   UROBILINOGEN 0.2 11/10/2019 1432   NITRITE Negative 11/10/2019 1432   NITRITE NEGATIVE 11/06/2018 2040   LEUKOCYTESUR Large (3+) (A) 11/10/2019 1432   LEUKOCYTESUR NEGATIVE 11/06/2018 2040    Radiological Exams on Admission: DG Chest 2 View  Result Date: 11/10/2019 CLINICAL DATA:  Shortness of breath EXAM: CHEST - 2 VIEW COMPARISON:  Radiograph 03/12/2019, CT 09/26/2019, 05/22/2019 FINDINGS: Coarse interstitial changes throughout the lungs compatible with findings of 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 Findings are not significantly changed since the 2020 radiographic comparison. No consolidative opacity. No pneumothorax or effusion. No acute osseous or soft tissue abnormality. Degenerative changes are present in the imaged spine and shoulders. The aorta is calcified. The remaining cardiomediastinal contours are unremarkable.  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 No acute cardiopulmonary abnormality. Aortic Atherosclerosis (ICD10-I70.0). Electronically Signed  By: Lovena Le M.D.   On: 11/10/2019 18:26    Assessment/Plan Principal Problem:   Complicated UTI (urinary tract infection) Active Problems:   HTN (hypertension)   AKI (acute kidney injury) ([place])   Hyponatremia   Elevated LFTs   Acute complicated UTI/concern for pyelonephritis: Does have leukocytosis but no signs of sepsis at this time.  UA with evidence of pyuria and hematuria.  Has left-sided CVA tenderness on exam.  Plan is to continue IV ceftriaxone.  Order urine culture.  Blood culture x2 pending.  Morphine as needed for pain, Zofran as needed for nausea.  Order CT abdomen pelvis to rule out obstructive uropathy.  AKI: Suspect prerenal due to severe dehydration and ARB use. BUN 27, creatinine 2.5.  Baseline creatinine 0.8.  Plan is to give IV fluid hydration.  Continue to monitor renal function and urine output.  Avoid nephrotoxic agents/contrast.  CT as above to rule out obstructive uropathy given hematuria.  Hyponatremia: Suspect related to decreased p.o. intake in the setting of acute illness.  Corrected sodium 128.  Continue IV fluid hydration and monitor sodium level.  Elevated LFTs: AST 51, ALT 137, alk phos 136.  T bili normal.  Not endorsing any right upper quadrant pain.  If no acute hepatobiliary pathology identified on CT abdomen, consider obtaining right upper quadrant ultrasound in the morning.   Hypertension: Stable.  Currently normotensive.  Continue home amlodipine.  Peripheral arterial disease: Was started on Xarelto in December 2020 due to concern for atheroembolic disease. Will hold at this time due to concern for hematuria.  DVT prophylaxis: SCDs at this time Code Status: Full  code Family Communication: No family available at this time. Disposition Plan: Anticipate discharge after clinical improvement.  She will likely need to stay in the hospital for greater than 2 midnights for IV antibiotic. Admission status: It is my clinical opinion that admission to INPATIENT is reasonable and necessary because of the expectation that this patient will require hospital care that crosses at least 2 midnights to treat this condition based on the medical complexity of the problems presented.  Given the aforementioned information, the predictability of an adverse outcome is felt to be significant.  The medical decision making on this patient was of high complexity and the patient is at high risk for clinical deterioration, therefore this is a level 3 visit.  Shela Leff MD [facility]  If 7PM-7AM, please contact night-coverage [email or web address]  11/11/2019, 6:28 AM

## 2019-11-11 NOTE — Progress Notes (Addendum)
PROGRESS NOTE    [name]  [id number] DOB: 06/01/1946 DOA: 11/10/2019 PCP: [name], MD    Brief Narrative:  Patient was admitted to the hospital with a working diagnosis of left pyelonephritis, complicated by E coli bacteremia, sepsis with endorgan failure: acute kidney injury and hyponatremia (present on admission).  74 year old female who presented with urinary tract infection.  She does have significant past medical history for abdominal aortic aneurysm, history of CVA, coronary artery disease, hypertension, gastritis, bipolar and depression.  Patient reported 1 week of dysuria, increased urinary frequency, suprapubic abdominal pain, left-sided flank pain and chills.  Symptoms associated with low-grade fever and nausea.  Patient was seen by her primary care provider who referred her to emergency room for evaluation.  On her initial physical examination blood pressure 118/60, heart rate 93, respirate 20, temperature 98.9, oxygen saturation 94%.  She had very dry mucous membranes, her lungs were clear to auscultation bilaterally, heart S1-S2 present and rhythmic, abdomen soft, left CVA tenderness. Sodium 127, potassium 4.1, chloride 93, bicarb 22, glucose 154, BUN 27, creatinine 2.59, white count 17.1, hemoglobin 12.3, hematocrit 38.2, platelets 244.  SARS COVID-19 was negative.  CT abdomen and pelvis with no acute findings no obstructive uropathy.  Chest radiograph with hyperinflation, bibasilar atelectasis and increased lung markings bilaterally.  EKG 86 bpm, normal axis, normal intervals, sinus rhythm with left atrial enlargement, poor R wave progression, J-point elevation V1 through V3, no significant ST segment or T wave changes.  Patient has been admitted to the medical ward, started on supportive fluids, and IV antibiotic therapy.   Admission blood cultures positive for E. coli.  Assessment & Plan:   Principal Problem:   Complicated UTI (urinary tract infection) Active  Problems:   HTN (hypertension)   AKI (acute kidney injury) ([place])   Hyponatremia   Elevated LFTs   Sepsis ([place])  1. Sepsis due to left pyelonephritis, end organ failure AKI, present on admission. E coli bacteremia. This am blood pressure is 107/85 with HR at 93. WBC continue to be elevated at 17,1 with blood cultures positive for E coli. CT with no obstructive uropathy.   Will hold on IV fluids this am, follow a restrictive IV fluids strategy. Continue high dose ceftriaxone 2 grams per day for bacteremia. Will follow with cell count, culture sensitivities and temperature curve.  Patient continue to be at high risk for worsening sepsis.   2. HTN. Will hold on amlodipine to prevent hypotension.   3. AKI with hyponatremia and non gap metabolic acidosis. Will follow renal panel this am. For now will hold on saline IV, continue to monitor urine output, and renal function. Avoid hypotension and nephrotoxic medications.   4. Bipolar, depression and restless leg syndrome. Will continue lamotrigine, ropinirole, and clonazepam.   5. GERD. Continue with famotidine.   DVT prophylaxis: enoxaparin   Code Status:  full Family Communication: no family at the bedside  Disposition Plan/ discharge barriers: patient from home, barriers for discharge sepsis requiring IV antibiotic therapy. Plan to return home when sepsis resolves.   Antimicrobials:   Ceftriaxone.     Subjective: Patient not able to sleep well last night, continue to have left lower back pain, and restless legs, no nausea or vomiting, continue to have generalized weakness.   Objective: Vitals:   11/11/19 0200 11/11/19 0300 11/11/19 0400 11/11/19 0527  BP: 118/60 131/70 127/67 131/61  Pulse: 93 99 94 96  Resp: 20 20 20 20   Temp:  98.9 F (37.2 C)  TempSrc:    Oral  SpO2: 94% 94% 94% 100%  Weight:      Height:        Intake/Output Summary (Last 24 hours) at 11/11/2019 1219 Last data filed at 11/11/2019 [id number] Gross per 24 hour    Intake 755.37 ml  Output --  Net 755.37 ml   Filed Weights   11/11/19 0010  Weight: 71.7 kg    Examination:   General: Not in pain or dyspnea, deconditioned  Neurology: Awake and alert, non focal  E ENT: mild pallor, no icterus, oral mucosa moist Cardiovascular: No JVD. S1-S2 present, rhythmic, no gallops, rubs, or murmurs. No lower extremity edema. Pulmonary: positive breath sounds bilaterally, adequate air movement, no wheezing, rhonchi or rales. Gastrointestinal. Abdomen with no organomegaly, non tender, no rebound or guarding/ positive left flank pain to palpation.  Skin. No rashes Musculoskeletal: no joint deformities     Data Reviewed: I have personally reviewed following labs and imaging studies  CBC: Recent Labs  Lab 11/10/19 1512 11/10/19 1626  WBC 16.5* 17.1*  NEUTROABS  --  15.6*  HGB 12.4 12.3  HCT 37.5 38.2  MCV 89.8 92.3  PLT  --  [id number]   Basic Metabolic Panel: Recent Labs  Lab 11/10/19 1514 11/10/19 1626  NA 128* 127*  K 3.9 4.1  CL 91* 93*  CO2 19* 22  GLUCOSE 111* 154*  BUN 26 27*  CREATININE 2.18* 2.59*  CALCIUM 8.7 8.9   GFR: Estimated Creatinine Clearance: 18.6 mL/min (A) (by C-G formula based on SCr of 2.59 mg/dL (H)). Liver Function Tests: Recent Labs  Lab 11/10/19 1514 11/10/19 1626  AST 48* 51*  ALT 132* 137*  ALKPHOS 160* 136*  BILITOT 0.5 1.1  PROT 6.5 7.4  ALBUMIN 3.7 3.4*   No results for input(s): LIPASE, AMYLASE in the last 168 hours. No results for input(s): AMMONIA in the last 168 hours. Coagulation Profile: Recent Labs  Lab 11/10/19 1626  INR 1.5*   Cardiac Enzymes: No results for input(s): CKTOTAL, CKMB, CKMBINDEX, TROPONINI in the last 168 hours. BNP (last 3 results) Recent Labs    03/10/19 1438  PROBNP 263   HbA1C: No results for input(s): HGBA1C in the last 72 hours. CBG: No results for input(s): GLUCAP in the last 168 hours. Lipid Profile: No results for input(s): CHOL, HDL, LDLCALC, TRIG,  CHOLHDL, LDLDIRECT in the last 72 hours. Thyroid Function Tests: No results for input(s): TSH, T4TOTAL, FREET4, T3FREE, THYROIDAB in the last 72 hours. Anemia Panel: No results for input(s): VITAMINB12, FOLATE, FERRITIN, TIBC, IRON, RETICCTPCT in the last 72 hours.    Radiology Studies: I have reviewed all of the imaging during this hospital visit personally     Scheduled Meds: . amLODipine  10 mg Oral Daily  . famotidine  20 mg Oral QHS  . lamoTRIgine  75 mg Oral Daily  . linaclotide  145 mcg Oral QAC breakfast  . Melatonin  18 mg Oral QHS  . rOPINIRole  3 mg Oral QHS   Continuous Infusions: . sodium chloride 150 mL/hr at 11/11/19 0727  . cefTRIAXone (ROCEPHIN)  IV       LOS: 0 days        Marlisha Vanwyk Gerome Apley, MD

## 2019-11-11 NOTE — ED Notes (Signed)
Pt calling out again in reference to her IV beeping. Pt informed that RN would be in to assist her in a moment.   RN to bedside.

## 2019-11-11 NOTE — Progress Notes (Signed)
Pt arrived in 6N from ED via stretcher, alert orientedx4 with vital signs as follow: Temp: 98.9, BP: 131/61 (78), HR: 96, RR: 20, Spo2: 100 on RA. Patient a little unsteady and needs assitance, per patient she has been week since 3 days ago. Patient oriented to room and bed controls. Patient used the bedside commode, one small bowel movement with urine noted. MD notified for patients orders specifically pain medicine and diet, will continue to monitor patient with remainder of shift.

## 2019-11-11 NOTE — ED Notes (Addendum)
Called to pts room. Pt upset that IV continues to beep. This emt attempted to instruct the pt on how to keep her arm straight so the IV doesn't continue beeping, however the pt insists that using her phone is the most important and "that the IV just beeps whenever it wants, it's not because of me." Pt swatted this EMTs hand away as I attempted to assist her in straightening out her arm.  Pt also reports she wants to take her home meds and is in agony and is upset that it isn't being addressed. RN aware.   Pt aware report is being called for her to be transported to the floor where she will be more comfortable. Pt states she just wants to leave because "this is fu*king ridiculous."

## 2019-11-11 NOTE — ED Provider Notes (Signed)
[facility] EMERGENCY DEPARTMENT Provider Note   CSN: [id number] Arrival date & time: 11/10/19  1607     History Chief Complaint  Patient presents with  . Urinary Tract Infection    [name] is a 74 y.o. female.  The history is provided by the patient.  Urinary Tract Infection Pain quality:  Burning Pain severity:  Moderate Onset quality:  Gradual Duration:  9 days Timing:  Constant Progression:  Worsening Chronicity:  New Recent urinary tract infections: no   Relieved by:  Nothing Worsened by:  Nothing Ineffective treatments:  None tried Urinary symptoms: no discolored urine, no frequent urination and no bladder incontinence   Associated symptoms: abdominal pain   Associated symptoms: no fever, no flank pain and no vomiting   Risk factors: no hx of pyelonephritis and not pregnant   Sent in by PMD for admit for severe UTi with chills.  No fevers. No vomiting.       Past Medical History:  Diagnosis Date  . AAA (abdominal aortic aneurysm) (HCC)    3.1 cm 07/08/18, 3 year follow-up recommended; possible 3 cm AAA by aortogram 09/13/18  . Anemia    PMH  . Appendicitis with abscess    07/08/18, s/p perc drain; resolved 07/30/18 by CT  . Arthritis   . Bipolar disorder ([place])   . Cerebrovascular disease    intra and extracranial vascular dx per MRI 4/11, neurology rec strict CVRF control  . Colonic inertia   . Constipation    chronic;severe  . Coronary artery disease   . Depression   . Duodenitis   . EKG abnormalities    changes, stress test neg (false EKG changes)  . Gastritis   . GERD (gastroesophageal reflux disease)   . Hypertension   . Hypothyroid 01/16/2014  . Meningioma ([place]) 10/21/2013  . PAD (peripheral artery disease) ([place])   . Psoriasis    sees derm  . Stroke ([place])   . Wears dentures   . Wears glasses     Patient Active Problem List   Diagnosis Date Noted  . Wound infection 11/07/2018  . Postoperative pain   . Sleep  disturbance   . Tobacco abuse   . Acute blood loss anemia   . Hypoalbuminemia due to protein-calorie malnutrition ([place])   . Debility 10/24/2018  . Pre-operative cardiovascular examination 09/26/2018  . HLD (hyperlipidemia) 09/26/2018  . GAD (generalized anxiety disorder) 08/07/2018  . Major depressive disorder, single episode 08/07/2018  . Appendiceal abscess 07/08/2018  . PVD (peripheral vascular disease) ([place]) 03/15/2018  . Atherosclerosis of native artery of left lower extremity with intermittent claudication ([place]) 03/15/2018  . Chronic left-sided low back pain with left-sided sciatica 12/25/2017  . Facial droop   . History of CVA (cerebrovascular accident) 10/23/2017  . Critical lower limb ischemia 11/08/2016  . Smoker 11/08/2016  . 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 Cigarette smoker 12/24/2015  . Hypothyroid ? 01/16/2014  . Mild cognitive impairment 01/16/2014  . Meningioma ([place]) 10/21/2013  . Chest pain 10/20/2013  . Medicare annual wellness visit, subsequent 06/16/2013  . Dizziness and giddiness 06/16/2013  . RLS (restless legs syndrome) 04/29/2012  . Abdominal pain 12/27/2010  . DEGENERATIVE JOINT DISEASE 09/23/2010  . CAROTID ARTERY DISEASE 08/15/2010  . CHEST PAIN 08/15/2010  . HEADACHE 12/02/2009  . BACK PAIN 11/22/2009  . CONSTIPATION, CHRONIC 01/29/2008  . NAUSEA 01/28/2008  . DEPRESSION 03/08/2007  . HTN (hypertension) 03/08/2007    Past Surgical History:  Procedure Laterality  Date  . ABDOMINAL AORTOGRAM W/LOWER EXTREMITY N/A 09/13/2018   Procedure: ABDOMINAL AORTOGRAM W/LOWER EXTREMITY;  Surgeon: Elam Dutch, MD;  Location: [facility];  Service: Cardiovascular;  Laterality: N/A;  . arthroscopy  04/2010   Right knee  . CATARACT EXTRACTION W/ INTRAOCULAR LENS  IMPLANT, BILATERAL    . COLONOSCOPY    . FEMORAL-POPLITEAL BYPASS GRAFT  10/22/2018  . FEMORAL-POPLITEAL BYPASS GRAFT Left 10/22/2018   Procedure: LEFT FEMORAL TO BELOW  THE KNEE POPLITEAL ARTERY BYPASS GRAFT;  Surgeon: Elam Dutch, MD;  Location: [facility];  Service: Vascular;  Laterality: Left;  . I & D EXTREMITY Left 11/08/2018   Procedure: IRRIGATION AND DEBRIDEMENT EXTREMITY Left Leg;  Surgeon: Elam Dutch, MD;  Location: [facility];  Service: Vascular;  Laterality: Left;  . IR RADIOLOGIST EVAL & MGMT  07/30/2018  . MULTIPLE TOOTH EXTRACTIONS    . TUBAL LIGATION       OB History   No obstetric history on file.     Family History  Problem Relation Age of Onset  . Breast cancer Mother        metastisis to bones  . Diabetes Son   . Heart disease Other        grandfather   . Alcohol abuse Brother   . Heart disease Brother   . Heart disease Maternal Aunt   . Lung cancer Brother        smoked  . Colon cancer Neg Hx     Social History   Tobacco Use  . Smoking status: Current Every Day Smoker    Packs/day: 0.75    Years: 60.00    Pack years: 45.00    Types: Cigarettes  . Smokeless tobacco: Never Used  Substance Use Topics  . Alcohol use: Yes    Alcohol/week: 0.0 standard drinks    Comment: yes on occassion  . Drug use: No    Home Medications Prior to Admission medications   Medication Sig Start Date End Date Taking? Authorizing Provider  amLODipine (NORVASC) 10 MG tablet Take 1 tablet (10 mg total) by mouth daily. 03/07/19  Yes [name], MD  DULoxetine (CYMBALTA) 30 MG capsule Take 1 capsule (30 mg total) by mouth every evening. 04/15/19  Yes Cottle, Billey Co., MD  famotidine (PEPCID) 20 MG tablet One at bedtime Patient taking differently: Take 20 mg by mouth at bedtime. One at bedtime 02/14/16  Yes Tanda Rockers, MD  lamoTRIgine (LAMICTAL) 150 MG tablet TAKE 1 TABLET(150 MG) BY MOUTH TWICE DAILY Patient taking differently: Take 75 mg by mouth daily.  05/23/19  Yes Cottle, Billey Co., MD  linaclotide [facility]) 145 MCG CAPS capsule Take 1 capsule (145 mcg total) by mouth daily before breakfast. 05/14/19  Yes Willia Craze,  NP  losartan (COZAAR) 50 MG tablet Take 1 tablet (50 mg total) by mouth daily. 03/07/19  Yes [name], MD  Melatonin 10 MG TABS Take 20 mg by mouth at bedtime.   Yes [provider]  polyethylene glycol powder (GLYCOLAX/MIRALAX) powder Take 17 g by mouth daily as needed for moderate constipation.    Yes [provider]  rOPINIRole (REQUIP) 2 MG tablet TAKE 1 AND 1/2 TABLETS(3 MG) BY MOUTH AT BEDTIME Patient taking differently: Take 3 mg by mouth at bedtime.  10/31/19  Yes Cottle, Billey Co., MD  clonazePAM (KLONOPIN) 0.5 MG tablet TAKE 1 TABLET BY MOUTH AT BEDTIME AS NEEDED FOR ANXIETY Patient taking  differently: Take 0.5 mg by mouth 2 (two) times daily as needed for anxiety. TAKE 1 TABLET BY MOUTH AT BEDTIME FOR ANXIETY 10/22/19   Cottle, Billey Co., MD  rivaroxaban (XARELTO) 20 MG TABS tablet Take 1 tablet (20 mg total) by mouth daily with supper. 10/02/19   Elam Dutch, MD    Allergies    Patient has no known allergies.  Review of Systems   Review of Systems  Constitutional: Positive for chills. Negative for fever.  HENT: Negative for congestion.   Eyes: Negative for visual disturbance.  Respiratory: Negative for shortness of breath.   Gastrointestinal: Positive for abdominal pain. Negative for vomiting.  Genitourinary: Positive for dysuria. Negative for flank pain and frequency.  Musculoskeletal: Negative for arthralgias.  Skin: Negative for rash.  Neurological: Negative for dizziness.  Psychiatric/Behavioral: Negative for agitation.  All other systems reviewed and are negative.   Physical Exam Updated Vital Signs BP 118/60   Pulse 93   Temp 99 F (37.2 C) (Oral)   Resp 20   Ht 5\' 8"  (1.727 m)   Wt 71.7 kg   SpO2 94%   BMI 24.03 kg/m   Physical Exam Vitals and nursing note reviewed.  Constitutional:      General: She is not in acute distress.    Appearance: Normal appearance.  HENT:     Head: Normocephalic and atraumatic.     Nose:  Nose normal.  Eyes:     Conjunctiva/sclera: Conjunctivae normal.     Pupils: Pupils are equal, round, and reactive to light.  Cardiovascular:     Rate and Rhythm: Normal rate and regular rhythm.     Pulses: Normal pulses.     Heart sounds: Normal heart sounds.  Pulmonary:     Effort: Pulmonary effort is normal.     Breath sounds: Normal breath sounds.  Abdominal:     General: Abdomen is flat. Bowel sounds are normal.     Tenderness: There is no abdominal tenderness. There is no guarding or rebound.  Musculoskeletal:        General: Normal range of motion.     Cervical back: Normal range of motion and neck supple.  Skin:    General: Skin is warm and dry.     Capillary Refill: Capillary refill takes less than 2 seconds.  Neurological:     General: No focal deficit present.     Mental Status: She is alert and oriented to person, place, and time.  Psychiatric:        Mood and Affect: Mood normal.        Behavior: Behavior normal.     ED Results / Procedures / Treatments   Labs (all labs ordered are listed, but only abnormal results are displayed) Results for orders placed or performed during the hospital encounter of 11/10/19  Comprehensive metabolic panel  Result Value Ref Range   Sodium 127 (L) 135 - 145 mmol/L   Potassium 4.1 3.5 - 5.1 mmol/L   Chloride 93 (L) 98 - 111 mmol/L   CO2 22 22 - 32 mmol/L   Glucose, Bld 154 (H) 70 - 99 mg/dL   BUN 27 (H) 8 - 23 mg/dL   Creatinine, Ser 2.59 (H) 0.44 - 1.00 mg/dL   Calcium 8.9 8.9 - 10.3 mg/dL   Total Protein 7.4 6.5 - 8.1 g/dL   Albumin 3.4 (L) 3.5 - 5.0 g/dL   AST 51 (H) 15 - 41 U/L   ALT 137 (H) 0 -  44 U/L   Alkaline Phosphatase 136 (H) 38 - 126 U/L   Total Bilirubin 1.1 0.3 - 1.2 mg/dL   GFR calc non Af Amer 17 (L) >60 mL/min   GFR calc Af Amer 20 (L) >60 mL/min   Anion gap 12 5 - 15  Lactic acid, plasma  Result Value Ref Range   Lactic Acid, Venous 1.7 0.5 - 1.9 mmol/L  CBC with Differential  Result Value Ref Range    WBC 17.1 (H) 4.0 - 10.5 K/uL   RBC 4.14 3.87 - 5.11 MIL/uL   Hemoglobin 12.3 12.0 - 15.0 g/dL   HCT 38.2 36.0 - 46.0 %   MCV 92.3 80.0 - 100.0 fL   MCH 29.7 26.0 - 34.0 pg   MCHC 32.2 30.0 - 36.0 g/dL   RDW 14.3 11.5 - 15.5 %   Platelets 244 150 - 400 K/uL   nRBC 0.0 0.0 - 0.2 %   Neutrophils Relative % 91 %   Neutro Abs 15.6 (H) 1.7 - 7.7 K/uL   Lymphocytes Relative 5 %   Lymphs Abs 0.9 0.7 - 4.0 K/uL   Monocytes Relative 4 %   Monocytes Absolute 0.7 0.1 - 1.0 K/uL   Eosinophils Relative 0 %   Eosinophils Absolute 0.0 0.0 - 0.5 K/uL   Basophils Relative 0 %   Basophils Absolute 0.0 0.0 - 0.1 K/uL   nRBC 0 0 /100 WBC   Abs Immature Granulocytes 0.00 0.00 - 0.07 K/uL  Protime-INR  Result Value Ref Range   Prothrombin Time 18.3 (H) 11.4 - 15.2 seconds   INR 1.5 (H) 0.8 - 1.2   DG Chest 2 View  Result Date: 11/10/2019 CLINICAL DATA:  Shortness of breath EXAM: CHEST - 2 VIEW COMPARISON:  Radiograph 03/12/2019, CT 09/26/2019, 05/22/2019 FINDINGS: Coarse interstitial changes throughout the lungs compatible with findings of 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 Findings are not significantly changed since the 2020 radiographic comparison. No consolidative opacity. No pneumothorax or effusion. No acute osseous or soft tissue abnormality. Degenerative changes are present in the imaged spine and shoulders. The aorta is calcified. The remaining cardiomediastinal contours are unremarkable. 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 No acute cardiopulmonary abnormality. Aortic Atherosclerosis (ICD10-I70.0). Electronically Signed   By: Lovena Le M.D.   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 Findings are not significantly changed since the 2020 radiographic comparison. No consolidative opacity. No pneumothorax or effusion. No acute osseous or soft tissue abnormality. Degenerative changes are present in the imaged spine and shoulders. The aorta is calcified. The remaining cardiomediastinal contours are unremarkable. 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 No acute cardiopulmonary abnormality. Aortic Atherosclerosis (ICD10-I70.0). Electronically Signed   By: Lovena Le M.D.   On: 11/10/2019 18:26    Procedures Procedures (including critical care time)  Medications Ordered in ED Medications  0.9 %  sodium chloride infusion (125 mL/hr Intravenous New Bag/Given 11/11/19 0047)  cefTRIAXone (ROCEPHIN) 1 g in sodium chloride 0.9 % 100 mL IVPB (  0 g Intravenous Stopped 11/11/19 0117)    ED Course  I have reviewed the triage vital signs and the nursing notes.  Pertinent labs & imaging results that were available during my care of the patient were reviewed by me and considered in my medical decision making (see chart for details).    Admit for UTi and leukocytosis.  New acute renal insufficiency.   Final Clinical Impression(s) / ED Diagnoses Final diagnoses:  Lower urinary tract infectious disease  Renal insufficiency    Admit to medicine    Latamara Melder, MD 11/11/19 [id number]

## 2019-11-12 ENCOUNTER — Telehealth: Payer: Self-pay | Admitting: Family Medicine

## 2019-11-12 DIAGNOSIS — A4151 Sepsis due to Escherichia coli [E. coli]: Principal | ICD-10-CM

## 2019-11-12 DIAGNOSIS — R6520 Severe sepsis without septic shock: Secondary | ICD-10-CM

## 2019-11-12 LAB — CBC WITH DIFFERENTIAL/PLATELET
Abs Immature Granulocytes: 0.08 10*3/uL — ABNORMAL HIGH (ref 0.00–0.07)
Basophils Absolute: 0.1 10*3/uL (ref 0.0–0.1)
Basophils Relative: 1 %
Eosinophils Absolute: 0.1 10*3/uL (ref 0.0–0.5)
Eosinophils Relative: 0 %
HCT: 34.1 % — ABNORMAL LOW (ref 36.0–46.0)
Hemoglobin: 11.6 g/dL — ABNORMAL LOW (ref 12.0–15.0)
Immature Granulocytes: 0 %
Lymphocytes Relative: 3 %
Lymphs Abs: 0.6 10*3/uL — ABNORMAL LOW (ref 0.7–4.0)
MCH: 29.6 pg (ref 26.0–34.0)
MCHC: 34 g/dL (ref 30.0–36.0)
MCV: 87 fL (ref 80.0–100.0)
Monocytes Absolute: 1.1 10*3/uL — ABNORMAL HIGH (ref 0.1–1.0)
Monocytes Relative: 6 %
Neutro Abs: 16.6 10*3/uL — ABNORMAL HIGH (ref 1.7–7.7)
Neutrophils Relative %: 90 %
Platelets: 156 10*3/uL (ref 150–400)
RBC: 3.92 MIL/uL (ref 3.87–5.11)
RDW: 14 % (ref 11.5–15.5)
WBC Morphology: INCREASED
WBC: 18.5 10*3/uL — ABNORMAL HIGH (ref 4.0–10.5)
nRBC: 0 % (ref 0.0–0.2)

## 2019-11-12 LAB — COMPREHENSIVE METABOLIC PANEL
ALT: 84 U/L — ABNORMAL HIGH (ref 0–44)
AST: 38 U/L (ref 15–41)
Albumin: 2.4 g/dL — ABNORMAL LOW (ref 3.5–5.0)
Alkaline Phosphatase: 169 U/L — ABNORMAL HIGH (ref 38–126)
Anion gap: 13 (ref 5–15)
BUN: 49 mg/dL — ABNORMAL HIGH (ref 8–23)
CO2: 20 mmol/L — ABNORMAL LOW (ref 22–32)
Calcium: 8.2 mg/dL — ABNORMAL LOW (ref 8.9–10.3)
Chloride: 94 mmol/L — ABNORMAL LOW (ref 98–111)
Creatinine, Ser: 4.11 mg/dL — ABNORMAL HIGH (ref 0.44–1.00)
GFR calc Af Amer: 11 mL/min — ABNORMAL LOW (ref >60)
GFR calc non Af Amer: 10 mL/min — ABNORMAL LOW (ref >60)
Glucose, Bld: 115 mg/dL — ABNORMAL HIGH (ref 70–99)
Potassium: 4.2 mmol/L (ref 3.5–5.1)
Sodium: 127 mmol/L — ABNORMAL LOW (ref 135–145)
Total Bilirubin: 0.7 mg/dL (ref 0.3–1.2)
Total Protein: 6 g/dL — ABNORMAL LOW (ref 6.5–8.1)

## 2019-11-12 LAB — HEPARIN LEVEL (UNFRACTIONATED): Heparin Unfractionated: 0.13 IU/mL — ABNORMAL LOW (ref 0.30–0.70)

## 2019-11-12 LAB — APTT: aPTT: 38 seconds — ABNORMAL HIGH (ref 24–36)

## 2019-11-12 MED ORDER — HEPARIN (PORCINE) 25000 UT/250ML-% IV SOLN
2100.0000 [IU]/h | INTRAVENOUS | Status: DC
  Administered 2019-11-12: 1100 [IU]/h via INTRAVENOUS
  Administered 2019-11-13: 1500 [IU]/h via INTRAVENOUS
  Administered 2019-11-14: 1800 [IU]/h via INTRAVENOUS
  Administered 2019-11-14: 1650 [IU]/h via INTRAVENOUS
  Administered 2019-11-15: 1800 [IU]/h via INTRAVENOUS
  Administered 2019-11-16: 2000 [IU]/h via INTRAVENOUS
  Administered 2019-11-17: 2100 [IU]/h via INTRAVENOUS
  Filled 2019-11-12 (×11): qty 250

## 2019-11-12 MED ORDER — SODIUM CHLORIDE 0.9 % IV SOLN: INTRAVENOUS | Status: DC

## 2019-11-12 MED ORDER — DULOXETINE HCL 30 MG PO CPEP
30.0000 mg | ORAL_CAPSULE | Freq: Every evening | ORAL | Status: DC
  Administered 2019-11-12 – 2019-11-26 (×11): 30 mg via ORAL
  Filled 2019-11-12 (×14): qty 1

## 2019-11-12 NOTE — Progress Notes (Signed)
ANTICOAGULATION CONSULT NOTE - Follow Up Consult  Pharmacy Consult for Xarelto to Heparin  Indication: PAD  No Known Allergies  Patient Measurements: Height: 5\' 8"  (172.7 cm) Weight: 158 lb 1.1 oz (71.7 kg) IBW/kg (Calculated) : 63.9  Vital Signs: Temp: 99 F (37.2 C) (03/03 2034) Temp Source: Oral (03/03 2034) BP: 155/75 (03/03 2034) Pulse Rate: 91 (03/03 2034)  Labs: Recent Labs    11/10/19 1512 11/10/19 1514 11/10/19 1626 11/11/19 1644 11/12/19 0131 11/12/19 2214  HGB 12.4  --  12.3  --  11.6*  --   HCT 37.5  --  38.2  --  34.1*  --   PLT  --   --  244  --  156  --   APTT  --   --   --   --   --  38*  LABPROT  --   --  18.3*  --   --   --   INR  --   --  1.5*  --   --   --   HEPARINUNFRC  --   --   --   --   --  0.13*  CREATININE  --    < > 2.59* 3.71* 4.11*  --    < > = values in this interval not displayed.    Estimated Creatinine Clearance: 11.7 mL/min (A) (by C-G formula based on SCr of 4.11 mg/dL (H)).  Assessment: 74 year old female on Xarelto prior to admission for PAD, last dose 2/28.  Xarelto held on admission 3/1 for hematuria, now to begin heparin due to AKI. Scr 4.11  Will use aPTT to guide heparin dosing until heparin level and aPTT correlate. PTT 38 sec (subtherapeutic) and heparin level 0.13 - may be correlating but hard to tell. No issues with line or bleeding reported per RN.  Goal of Therapy:  Heparin level 0.3-0.7 units/ml Monitor platelets by anticoagulation protocol: Yes  PTT 66 to 102 seconds   Plan:  Increase heparin drip to 1300 units / hr Heparin level / PTT 8 hours  Sherlon Handing, PharmD, BCPS Please see amion for complete clinical pharmacist phone list 11/12/2019,11:52 PM

## 2019-11-12 NOTE — Telephone Encounter (Signed)
Pt called is in [facility] says for sceptic and is wanting rivaroxaban (XARELTO) 20 MG TABS tablet [id number]  medication but Hospital is not giving it to her because it will effect her kidneys even more. Told pt to give [place] a call when she knows her discharge date to do a HOSPFU with dr. [phone] Please advise.

## 2019-11-12 NOTE — Progress Notes (Signed)
Called by patient's medical doctor today.  Patient is currently admitted to [address].  She apparently has developed worsening renal function.  She was originally placed on Xarelto for what was suspected to be a central atheroembolic event for diffuse atherosclerosis of her thoracic and abdominal aorta.  In light of renal metabolism of her Xarelto I agree that this should be discontinued.  She will be maintained on IV heparin while in the hospital.  Depending on the outcome of her renal function we could consider whether or not to put her back on anticoagulation and by what method nearer to the end of her hospitalization.  Please call if questions.  Ruta Hinds, MD [facility] Office: [phone]

## 2019-11-12 NOTE — Progress Notes (Signed)
PROGRESS NOTE    [name]  [id number] DOB: 03-Nov-1945 DOA: 11/10/2019 PCP: [name], MD    Brief Narrative:  74 year old female who presented with urinary tract infection.  She does have significant past medical history for abdominal aortic aneurysm, history of CVA, coronary artery disease, hypertension, gastritis, bipolar and depression. Patient reported 1 week of dysuria, increased urinary frequency, suprapubic abdominal pain, left-sided flank pain and chills. Symptoms associated with low-grade fever and nausea.  Patient was seen by her primary care provider who referred her to emergency room for evaluation. CT abdomen and pelvis with no acute findings no obstructive uropathy.  Chest radiograph with hyperinflation, bibasilar atelectasis and increased lung markings bilaterally. Patient has been admitted to the medical ward, started on supportive fluids, and IV antibiotic therapy. Admission blood cultures positive for E. Coli.     Assessment & Plan:   Principal Problem:   Complicated UTI (urinary tract infection) Active Problems:   HTN (hypertension)   AKI (acute kidney injury) ([place])   Hyponatremia   Elevated LFTs   Sepsis ([place])   Sepsis due to left pyelonephritis, end organ failure AKI, present on admission. E coli bacteremia Currently afebrile, worsening leukocytosis BC x2 growing E. coli CT with no obstructive uropathy Continue IV ceftriaxone Continue IV fluids Monitor closely  AKI with hyponatremia and non gap metabolic acidosis Worsening Restart IV fluids Daily BMP  HTN BP soft Hold home losartan, amlodipine for now IV fluids  History of peripheral vascular disease with possible atheroemboli Patient was on Xarelto 20 mg daily Due to AKI currently, unable to take Xarelto, discussed with Dr. Oneida Alar on 11/12/2019 okay to switch to IV Heparin for now pending improvement of renal function Continue IV Heparin  GERD Continue famotidine  Chronic  constipation Continue Linzess  Bipolar, depression and restless leg syndrome Continue lamotrigine, ropinirole, and clonazepam.     DVT prophylaxis: Heparin drip Code Status:  full Family Communication: Plan to update family Disposition Plan/ discharge barriers: patient from home, barriers for discharge sepsis requiring IV antibiotic therapy. Plan to return home when sepsis resolves.    Antimicrobials:   Ceftriaxone.     Subjective: Patient continues to complain of lower abdominal pain, back pain, generalized weakness, lethargy, poor appetite.  Denies any chest pain, shortness of breath, fever/chills, nausea/vomiting.  Objective: Vitals:   11/11/19 1336 11/11/19 2106 11/12/19 0500 11/12/19 0553  BP: 107/85 123/67 127/66 127/66  Pulse: 93 91 95 95  Resp: 17 20 20 20   Temp: 98.1 F (36.7 C) (!) 97.4 F (36.3 C) 98.6 F (37 C) 98.6 F (37 C)  TempSrc: Oral Oral Oral Oral  SpO2: 96% 94% 93% 93%  Weight:      Height:        Intake/Output Summary (Last 24 hours) at 11/12/2019 1753 Last data filed at 11/11/2019 1920 Gross per 24 hour  Intake 180 ml  Output --  Net 180 ml   Filed Weights   11/11/19 0010  Weight: 71.7 kg    Examination:   General: NAD, lethargic, deconditioned  Cardiovascular: S1, S2 present  Respiratory: CTAB  Abdomen: Soft, +tender, nondistended, bowel sounds present  Musculoskeletal: No bilateral pedal edema noted  Skin: Normal  Psychiatry: Normal mood     Data Reviewed: I have personally reviewed following labs and imaging studies  CBC: Recent Labs  Lab 11/10/19 1512 11/10/19 1626 11/12/19 0131  WBC 16.5* 17.1* 18.5*  NEUTROABS  --  15.6* 16.6*  HGB 12.4 12.3 11.6*  HCT  37.5 38.2 34.1*  MCV 89.8 92.3 87.0  PLT  --  244 [id number]   Basic Metabolic Panel: Recent Labs  Lab 11/10/19 1514 11/10/19 1626 11/11/19 1644 11/12/19 0131  NA 128* 127* 127* 127*  K 3.9 4.1 3.9 4.2  CL 91* 93* 95* 94*  CO2 19* 22 19* 20*  GLUCOSE 111*  154* 108* 115*  BUN 26 27* 43* 49*  CREATININE 2.18* 2.59* 3.71* 4.11*  CALCIUM 8.7 8.9 8.2* 8.2*   GFR: Estimated Creatinine Clearance: 11.7 mL/min (A) (by C-G formula based on SCr of 4.11 mg/dL (H)). Liver Function Tests: Recent Labs  Lab 11/10/19 1514 11/10/19 1626 11/12/19 0131  AST 48* 51* 38  ALT 132* 137* 84*  ALKPHOS 160* 136* 169*  BILITOT 0.5 1.1 0.7  PROT 6.5 7.4 6.0*  ALBUMIN 3.7 3.4* 2.4*   No results for input(s): LIPASE, AMYLASE in the last 168 hours. No results for input(s): AMMONIA in the last 168 hours. Coagulation Profile: Recent Labs  Lab 11/10/19 1626  INR 1.5*   Cardiac Enzymes: No results for input(s): CKTOTAL, CKMB, CKMBINDEX, TROPONINI in the last 168 hours. BNP (last 3 results) Recent Labs    03/10/19 1438  PROBNP 263   HbA1C: No results for input(s): HGBA1C in the last 72 hours. CBG: No results for input(s): GLUCAP in the last 168 hours. Lipid Profile: No results for input(s): CHOL, HDL, LDLCALC, TRIG, CHOLHDL, LDLDIRECT in the last 72 hours. Thyroid Function Tests: No results for input(s): TSH, T4TOTAL, FREET4, T3FREE, THYROIDAB in the last 72 hours. Anemia Panel: No results for input(s): VITAMINB12, FOLATE, FERRITIN, TIBC, IRON, RETICCTPCT in the last 72 hours.    Radiology Studies: I have reviewed all of the imaging during this hospital visit personally     Scheduled Meds: . famotidine  20 mg Oral QHS  . lamoTRIgine  75 mg Oral Daily  . linaclotide  145 mcg Oral QAC breakfast  . Melatonin  18 mg Oral QHS  . rOPINIRole  3 mg Oral QHS   Continuous Infusions: . sodium chloride 100 mL/hr at 11/12/19 1000  . cefTRIAXone (ROCEPHIN)  IV 2 g (11/11/19 2129)  . heparin 1,100 Units/hr (11/12/19 1653)     LOS: 1 day        Alma Friendly, MD

## 2019-11-12 NOTE — Telephone Encounter (Signed)
Pharmacy note from today reviewed. Plan for transition from [place] to heparin due to kidney injury and hematuria.  I recommend she discuss concerns or questions regarding that change with her treating physician/team inpatient.

## 2019-11-12 NOTE — Telephone Encounter (Signed)
Called and spoke with patient.

## 2019-11-12 NOTE — Progress Notes (Signed)
Patient is refusing heparin drip. MD paged and aware of situation.

## 2019-11-12 NOTE — Progress Notes (Signed)
ANTICOAGULATION CONSULT NOTE - Follow Up Consult  Pharmacy Consult for Xarelto to Heparin  Indication: PAD  No Known Allergies  Patient Measurements: Height: 5\' 8"  (172.7 cm) Weight: 158 lb 1.1 oz (71.7 kg) IBW/kg (Calculated) : 63.9  Vital Signs: Temp: 98.6 F (37 C) (03/03 0553) Temp Source: Oral (03/03 0553) BP: 127/66 (03/03 0553) Pulse Rate: 95 (03/03 0553)  Labs: Recent Labs    11/10/19 1512 11/10/19 1514 11/10/19 1626 11/11/19 1644 11/12/19 0131  HGB 12.4  --  12.3  --  11.6*  HCT 37.5  --  38.2  --  34.1*  PLT  --   --  244  --  156  LABPROT  --   --  18.3*  --   --   INR  --   --  1.5*  --   --   CREATININE  --    < > 2.59* 3.71* 4.11*   < > = values in this interval not displayed.    Estimated Creatinine Clearance: 11.7 mL/min (A) (by C-G formula based on SCr of 4.11 mg/dL (H)).  Assessment: 74 year old female on Xarelto prior to admission for PAD, last dose 2/28.  Xarelto held on admission 3/1 for hematuria, now to begin heparin due to AKI (Scr 4.11)  Will use aPTT to guide heparin dosing until heparin level and aPTT correlate  Goal of Therapy:  Heparin level 0.3-0.7 units/ml Monitor platelets by anticoagulation protocol: Yes  PTT = 66 to 102 seconds   Plan:  No heparin bolus for now due to AKI/hematuria Heparin drip at 1100 units / hr Heparin level / PTT 8 hours after heparin begins Daily heparin level, PTT, CBC  Thank you Anette Guarneri, PharmD [phone] 11/12/2019,10:17 AM

## 2019-11-12 NOTE — Telephone Encounter (Signed)
Please Advise

## 2019-11-13 LAB — CBC WITH DIFFERENTIAL/PLATELET
Abs Immature Granulocytes: 0.16 10*3/uL — ABNORMAL HIGH (ref 0.00–0.07)
Basophils Absolute: 0.1 10*3/uL (ref 0.0–0.1)
Basophils Relative: 1 %
Eosinophils Absolute: 0 10*3/uL (ref 0.0–0.5)
Eosinophils Relative: 0 %
HCT: 37.1 % (ref 36.0–46.0)
Hemoglobin: 12.3 g/dL (ref 12.0–15.0)
Immature Granulocytes: 1 %
Lymphocytes Relative: 6 %
Lymphs Abs: 0.7 10*3/uL (ref 0.7–4.0)
MCH: 29.4 pg (ref 26.0–34.0)
MCHC: 33.2 g/dL (ref 30.0–36.0)
MCV: 88.8 fL (ref 80.0–100.0)
Monocytes Absolute: 0.8 10*3/uL (ref 0.1–1.0)
Monocytes Relative: 7 %
Neutro Abs: 10.2 10*3/uL — ABNORMAL HIGH (ref 1.7–7.7)
Neutrophils Relative %: 85 %
Platelets: 125 10*3/uL — ABNORMAL LOW (ref 150–400)
RBC: 4.18 MIL/uL (ref 3.87–5.11)
RDW: 14.6 % (ref 11.5–15.5)
WBC: 11.9 10*3/uL — ABNORMAL HIGH (ref 4.0–10.5)
nRBC: 0 % (ref 0.0–0.2)

## 2019-11-13 LAB — BASIC METABOLIC PANEL
Anion gap: 16 — ABNORMAL HIGH (ref 5–15)
BUN: 62 mg/dL — ABNORMAL HIGH (ref 8–23)
CO2: 16 mmol/L — ABNORMAL LOW (ref 22–32)
Calcium: 8.3 mg/dL — ABNORMAL LOW (ref 8.9–10.3)
Chloride: 97 mmol/L — ABNORMAL LOW (ref 98–111)
Creatinine, Ser: 3.99 mg/dL — ABNORMAL HIGH (ref 0.44–1.00)
GFR calc Af Amer: 12 mL/min — ABNORMAL LOW (ref >60)
GFR calc non Af Amer: 10 mL/min — ABNORMAL LOW (ref >60)
Glucose, Bld: 108 mg/dL — ABNORMAL HIGH (ref 70–99)
Potassium: 3.6 mmol/L (ref 3.5–5.1)
Sodium: 129 mmol/L — ABNORMAL LOW (ref 135–145)

## 2019-11-13 LAB — APTT
aPTT: 40 seconds — ABNORMAL HIGH (ref 24–36)
aPTT: 61 seconds — ABNORMAL HIGH (ref 24–36)

## 2019-11-13 LAB — HEPARIN LEVEL (UNFRACTIONATED)
Heparin Unfractionated: 0.16 IU/mL — ABNORMAL LOW (ref 0.30–0.70)
Heparin Unfractionated: 0.24 IU/mL — ABNORMAL LOW (ref 0.30–0.70)

## 2019-11-13 MED ORDER — HYDROCODONE-ACETAMINOPHEN 5-325 MG PO TABS
1.0000 | ORAL_TABLET | ORAL | Status: DC | PRN
  Administered 2019-11-16: 1 via ORAL
  Administered 2019-11-16: 2 via ORAL
  Administered 2019-11-17 – 2019-11-26 (×5): 1 via ORAL
  Filled 2019-11-13: qty 2
  Filled 2019-11-13 (×7): qty 1

## 2019-11-13 NOTE — Progress Notes (Signed)
PROGRESS NOTE    [name]  [id number] DOB: November 11, 1945 DOA: 11/10/2019 PCP: [name], MD    Brief Narrative:  74 year old female who presented with urinary tract infection.  She does have significant past medical history for abdominal aortic aneurysm, history of CVA, coronary artery disease, hypertension, gastritis, bipolar and depression. Patient reported 1 week of dysuria, increased urinary frequency, suprapubic abdominal pain, left-sided flank pain and chills. Symptoms associated with low-grade fever and nausea.  Patient was seen by her primary care provider who referred her to emergency room for evaluation. CT abdomen and pelvis with no acute findings no obstructive uropathy.  Chest radiograph with hyperinflation, bibasilar atelectasis and increased lung markings bilaterally. Patient has been admitted to the medical ward, started on supportive fluids, and IV antibiotic therapy. Admission blood cultures positive for E. Coli.     Assessment & Plan:   Principal Problem:   Complicated UTI (urinary tract infection) Active Problems:   HTN (hypertension)   AKI (acute kidney injury) ([place])   Hyponatremia   Elevated LFTs   Sepsis ([place])   Sepsis due to left pyelonephritis, end organ failure AKI, present on admission. E coli bacteremia Currently afebrile, improved leukocytosis BC x2 growing E. coli CT with no obstructive uropathy Continue IV ceftriaxone Continue IV fluids Monitor closely  AKI with hyponatremia and non gap metabolic acidosis Improving IV fluids Daily BMP  HTN Hold home losartan, amlodipine for now IV fluids  History of peripheral vascular disease with possible atheroemboli Patient was on Xarelto 20 mg daily Due to AKI currently, unable to take Xarelto, discussed with Dr. Oneida Alar on 11/12/2019 okay to switch to IV Heparin for now pending improvement of renal function Continue IV Heparin  GERD Continue famotidine  Chronic constipation Continue  Linzess  Bipolar, depression and restless leg syndrome Continue lamotrigine, ropinirole, and clonazepam.     DVT prophylaxis: Heparin drip Code Status:  full Family Communication: Plan to update family Disposition Plan/ discharge barriers: patient from home, barriers for discharge sepsis requiring IV antibiotic therapy. Plan to return home when sepsis resolves. Still with AKI requiring IVF   Antimicrobials:   Ceftriaxone.     Subjective: Patient still with lower abd pain, fatigue and generalized weakness. Denies any chest pain, SOB, fever/chills  Objective: Vitals:   11/12/19 0553 11/12/19 2034 11/13/19 0631 11/13/19 1425  BP: 127/66 (!) 155/75 (!) 158/71 (!) 147/102  Pulse: 95 91 91 87  Resp: 20 17 18 18   Temp: 98.6 F (37 C) 99 F (37.2 C) 98.4 F (36.9 C) 98.4 F (36.9 C)  TempSrc: Oral Oral Oral Oral  SpO2: 93% 95% 94% 91%  Weight:      Height:        Intake/Output Summary (Last 24 hours) at 11/13/2019 1827 Last data filed at 11/13/2019 1426 Gross per 24 hour  Intake 2573.04 ml  Output 1 ml  Net 2572.04 ml   Filed Weights   11/11/19 0010  Weight: 71.7 kg    Examination:   General: NAD, deconditioned  Cardiovascular: S1, S2 present  Respiratory: CTAB  Abdomen: Soft, +tender, nondistended, bowel sounds present  Musculoskeletal: No bilateral pedal edema noted  Skin: Normal  Psychiatry: Normal mood     Data Reviewed: I have personally reviewed following labs and imaging studies  CBC: Recent Labs  Lab 11/10/19 1512 11/10/19 1626 11/12/19 0131 11/13/19 0934  WBC 16.5* 17.1* 18.5* 11.9*  NEUTROABS  --  15.6* 16.6* 10.2*  HGB 12.4 12.3 11.6* 12.3  HCT 37.5  38.2 34.1* 37.1  MCV 89.8 92.3 87.0 88.8  PLT  --  244 156 0000000*   Basic Metabolic Panel: Recent Labs  Lab 11/10/19 1514 11/10/19 1626 11/11/19 1644 11/12/19 0131 11/13/19 0934  NA 128* 127* 127* 127* 129*  K 3.9 4.1 3.9 4.2 3.6  CL 91* 93* 95* 94* 97*  CO2 19* 22 19* 20* 16*    GLUCOSE 111* 154* 108* 115* 108*  BUN 26 27* 43* 49* 62*  CREATININE 2.18* 2.59* 3.71* 4.11* 3.99*  CALCIUM 8.7 8.9 8.2* 8.2* 8.3*   GFR: Estimated Creatinine Clearance: 12.1 mL/min (A) (by C-G formula based on SCr of 3.99 mg/dL (H)). Liver Function Tests: Recent Labs  Lab 11/10/19 1514 11/10/19 1626 11/12/19 0131  AST 48* 51* 38  ALT 132* 137* 84*  ALKPHOS 160* 136* 169*  BILITOT 0.5 1.1 0.7  PROT 6.5 7.4 6.0*  ALBUMIN 3.7 3.4* 2.4*   No results for input(s): LIPASE, AMYLASE in the last 168 hours. No results for input(s): AMMONIA in the last 168 hours. Coagulation Profile: Recent Labs  Lab 11/10/19 1626  INR 1.5*   Cardiac Enzymes: No results for input(s): CKTOTAL, CKMB, CKMBINDEX, TROPONINI in the last 168 hours. BNP (last 3 results) Recent Labs    03/10/19 1438  PROBNP 263   HbA1C: No results for input(s): HGBA1C in the last 72 hours. CBG: No results for input(s): GLUCAP in the last 168 hours. Lipid Profile: No results for input(s): CHOL, HDL, LDLCALC, TRIG, CHOLHDL, LDLDIRECT in the last 72 hours. Thyroid Function Tests: No results for input(s): TSH, T4TOTAL, FREET4, T3FREE, THYROIDAB in the last 72 hours. Anemia Panel: No results for input(s): VITAMINB12, FOLATE, FERRITIN, TIBC, IRON, RETICCTPCT in the last 72 hours.    Radiology Studies: I have reviewed all of the imaging during this hospital visit personally     Scheduled Meds: . DULoxetine  30 mg Oral QPM  . famotidine  20 mg Oral QHS  . lamoTRIgine  75 mg Oral Daily  . linaclotide  145 mcg Oral QAC breakfast  . Melatonin  18 mg Oral QHS  . rOPINIRole  3 mg Oral QHS   Continuous Infusions: . sodium chloride 100 mL/hr at 11/13/19 1650  . cefTRIAXone (ROCEPHIN)  IV Stopped (11/12/19 2216)  . heparin 1,500 Units/hr (11/13/19 1406)     LOS: 2 days        Alma Friendly, MD

## 2019-11-13 NOTE — Progress Notes (Signed)
ANTICOAGULATION CONSULT NOTE - Follow Up Consult  Pharmacy Consult for Xarelto to Heparin  Indication: PAD  No Known Allergies  Patient Measurements: Height: 5\' 8"  (172.7 cm) Weight: 158 lb 1.1 oz (71.7 kg) IBW/kg (Calculated) : 63.9  Heparin dosing weight: 71.7 kg   Vital Signs: Temp: 98.4 F (36.9 C) (03/04 0631) Temp Source: Oral (03/04 0631) BP: 158/71 (03/04 0631) Pulse Rate: 91 (03/04 0631)  Labs: Recent Labs    11/10/19 1626 11/10/19 1626 11/11/19 1644 11/12/19 0131 11/12/19 2214 11/13/19 0934  HGB 12.3   < >  --  11.6*  --  12.3  HCT 38.2  --   --  34.1*  --  37.1  PLT 244  --   --  156  --  125*  APTT  --   --   --   --  38* 40*  LABPROT 18.3*  --   --   --   --   --   INR 1.5*  --   --   --   --   --   HEPARINUNFRC  --   --   --   --  0.13* 0.16*  CREATININE 2.59*   < > 3.71* 4.11*  --  3.99*   < > = values in this interval not displayed.    Estimated Creatinine Clearance: 12.1 mL/min (A) (by C-G formula based on SCr of 3.99 mg/dL (H)).  Assessment: 74 year old female on Xarelto prior to admission for PAD, last dose 2/28.  Xarelto held on admission 3/1 for hematuria, now to begin heparin due to AKI. Scr 4.11>3.99.  Will use aPTT to guide heparin dosing until heparin level and aPTT correlate. PTT 40 sec (subtherapeutic) and heparin level 0.16 after heparin drip increased to 1300 ut/hr. -HL and PTTs appear to be correlating. No issues with line or bleeding reported per RN.  HgB 12.3 stable, pltc 156>125k.    Goal of Therapy:  Heparin level 0.3-0.7 units/ml Monitor platelets by anticoagulation protocol: Yes  PTT 66 to 102 seconds   Plan:  Increase heparin drip to 1500 units / hr Heparin level / PTT 8 hours Daily HL, aPTT, CBC Watch pltc.  Nicole Cella, RPh Clinical Pharmacist Please see amion for complete clinical pharmacist phone list 11/13/2019,11:53 AM

## 2019-11-13 NOTE — Plan of Care (Signed)
  Problem: Education: Goal: Knowledge of General Education information will improve Description Including pain rating scale, medication(s)/side effects and non-pharmacologic comfort measures Outcome: Progressing   

## 2019-11-13 NOTE — Progress Notes (Signed)
ANTICOAGULATION CONSULT NOTE  Pharmacy Consult:  Heparin Indication: PAD  No Known Allergies  Patient Measurements: Height: 5\' 8"  (172.7 cm) Weight: 158 lb 1.1 oz (71.7 kg) IBW/kg (Calculated) : 63.9  Heparin dosing weight: 71.7 kg   Vital Signs: Temp: 98.6 F (37 C) (03/04 2144) Temp Source: Oral (03/04 2144) BP: 157/76 (03/04 2144) Pulse Rate: 86 (03/04 2144)  Labs: Recent Labs    11/11/19 1644 11/12/19 0131 11/12/19 2214 11/13/19 0934 11/13/19 2123  HGB  --  11.6*  --  12.3  --   HCT  --  34.1*  --  37.1  --   PLT  --  156  --  125*  --   APTT  --   --  38* 40* 61*  HEPARINUNFRC  --   --  0.13* 0.16*  --   CREATININE 3.71* 4.11*  --  3.99*  --     Estimated Creatinine Clearance: 12.1 mL/min (A) (by C-G formula based on SCr of 3.99 mg/dL (H)).  Assessment: 74 year old female on Xarelto PTA for PAD, last dose 2/28.  Xarelto held on admission 3/1 for hematuria, now to begin heparin due to AKI.  Will use aPTT to guide heparin dosing until heparin level and aPTT correlate.   APTT and heparin level are slightly sub-therapeutic.  No issue with heparin infusion nor bleeding per RN.  Goal of Therapy:  Heparin level 0.3-0.7 units/ml Monitor platelets by anticoagulation protocol: Yes  PTT 66 to 102 seconds   Plan:  Increase heparin gtt to 1650 units/hr Check 8 hr aPTT / HL  Jamonte Curfman D. Mina Marble, PharmD, BCPS, [place] 11/13/2019, 10:27 PM

## 2019-11-13 NOTE — Progress Notes (Signed)
Pt is c/o of new pain with cough and pain in upper abdomen, more chills than usual.. MD aware.

## 2019-11-13 NOTE — Progress Notes (Signed)
RN arrived at bedside and patient was in tears, the IV pole was alarming. She stated it was too loud and could I "throw it out the the window". I reassured her that she was ok and I would silence the IV beeping. Emotional support given and PRN medication given SEE MAR.

## 2019-11-14 ENCOUNTER — Inpatient Hospital Stay (HOSPITAL_COMMUNITY): Payer: Medicare Other

## 2019-11-14 LAB — CBC WITH DIFFERENTIAL/PLATELET
Abs Immature Granulocytes: 0.17 10*3/uL — ABNORMAL HIGH (ref 0.00–0.07)
Basophils Absolute: 0.1 10*3/uL (ref 0.0–0.1)
Basophils Relative: 1 %
Eosinophils Absolute: 0 10*3/uL (ref 0.0–0.5)
Eosinophils Relative: 0 %
HCT: 36.2 % (ref 36.0–46.0)
Hemoglobin: 12.2 g/dL (ref 12.0–15.0)
Immature Granulocytes: 2 %
Lymphocytes Relative: 6 %
Lymphs Abs: 0.7 10*3/uL (ref 0.7–4.0)
MCH: 28.9 pg (ref 26.0–34.0)
MCHC: 33.7 g/dL (ref 30.0–36.0)
MCV: 85.8 fL (ref 80.0–100.0)
Monocytes Absolute: 1.1 10*3/uL — ABNORMAL HIGH (ref 0.1–1.0)
Monocytes Relative: 9 %
Neutro Abs: 9.4 10*3/uL — ABNORMAL HIGH (ref 1.7–7.7)
Neutrophils Relative %: 82 %
Platelets: 105 10*3/uL — ABNORMAL LOW (ref 150–400)
RBC: 4.22 MIL/uL (ref 3.87–5.11)
RDW: 14.6 % (ref 11.5–15.5)
WBC: 11.4 10*3/uL — ABNORMAL HIGH (ref 4.0–10.5)
nRBC: 0 % (ref 0.0–0.2)

## 2019-11-14 LAB — BASIC METABOLIC PANEL
Anion gap: 14 (ref 5–15)
BUN: 61 mg/dL — ABNORMAL HIGH (ref 8–23)
CO2: 15 mmol/L — ABNORMAL LOW (ref 22–32)
Calcium: 8.3 mg/dL — ABNORMAL LOW (ref 8.9–10.3)
Chloride: 101 mmol/L (ref 98–111)
Creatinine, Ser: 3.61 mg/dL — ABNORMAL HIGH (ref 0.44–1.00)
GFR calc Af Amer: 13 mL/min — ABNORMAL LOW (ref >60)
GFR calc non Af Amer: 12 mL/min — ABNORMAL LOW (ref >60)
Glucose, Bld: 110 mg/dL — ABNORMAL HIGH (ref 70–99)
Potassium: 3.5 mmol/L (ref 3.5–5.1)
Sodium: 130 mmol/L — ABNORMAL LOW (ref 135–145)

## 2019-11-14 LAB — HEPARIN LEVEL (UNFRACTIONATED): Heparin Unfractionated: 0.25 IU/mL — ABNORMAL LOW (ref 0.30–0.70)

## 2019-11-14 LAB — APTT: aPTT: 57 seconds — ABNORMAL HIGH (ref 24–36)

## 2019-11-14 MED ORDER — SODIUM BICARBONATE 650 MG PO TABS
650.0000 mg | ORAL_TABLET | Freq: Two times a day (BID) | ORAL | Status: DC
  Administered 2019-11-14 – 2019-11-21 (×12): 650 mg via ORAL
  Filled 2019-11-14 (×14): qty 1

## 2019-11-14 MED ORDER — HYDRALAZINE HCL 20 MG/ML IJ SOLN
5.0000 mg | Freq: Once | INTRAMUSCULAR | Status: AC
  Administered 2019-11-14: 5 mg via INTRAVENOUS
  Filled 2019-11-14: qty 1

## 2019-11-14 MED ORDER — ONDANSETRON HCL 4 MG PO TABS
4.0000 mg | ORAL_TABLET | Freq: Three times a day (TID) | ORAL | Status: DC | PRN
  Administered 2019-11-24: 4 mg via ORAL
  Filled 2019-11-14: qty 1

## 2019-11-14 MED ORDER — AMLODIPINE BESYLATE 10 MG PO TABS
10.0000 mg | ORAL_TABLET | Freq: Every day | ORAL | Status: DC
  Administered 2019-11-14 – 2019-11-17 (×4): 10 mg via ORAL
  Filled 2019-11-14 (×4): qty 1

## 2019-11-14 NOTE — Progress Notes (Signed)
PROGRESS NOTE    [name]  [id number] DOB: [birth year] DOA: 11/10/2019 PCP: [name], MD    Brief Narrative:  74 year old female who presented with urinary tract infection.  She does have significant past medical history for abdominal aortic aneurysm, history of CVA, coronary artery disease, hypertension, gastritis, bipolar and depression. Patient reported 1 week of dysuria, increased urinary frequency, suprapubic abdominal pain, left-sided flank pain and chills. Symptoms associated with low-grade fever and nausea.  Patient was seen by her primary care provider who referred her to emergency room for evaluation. CT abdomen and pelvis with no acute findings no obstructive uropathy.  Chest radiograph with hyperinflation, bibasilar atelectasis and increased lung markings bilaterally. Patient has been admitted to the medical ward, started on supportive fluids, and IV antibiotic therapy. Admission blood cultures positive for E. Coli.     Assessment & Plan:   Principal Problem:   Complicated UTI (urinary tract infection) Active Problems:   HTN (hypertension)   AKI (acute kidney injury) ([place])   Hyponatremia   Elevated LFTs   Sepsis ([place])   Sepsis due to left pyelonephritis, end organ failure AKI, present on admission E coli bacteremia Currently afebrile, improved leukocytosis BC x2 growing E. Coli, awaiting final report CT with no obstructive uropathy Continue IV ceftriaxone Continue IV fluids Monitor closely  AKI with hyponatremia and non gap metabolic acidosis Slowly improving IV fluids Daily BMP  HTN Restarted amlodipine, continue to hold home losartan  History of peripheral vascular disease with possible atheroemboli Patient was on Xarelto 20 mg daily Due to AKI currently, unable to take Xarelto, discussed with Dr. Oneida Alar on 11/12/2019 okay to switch to IV Heparin for now pending improvement of renal function Continue IV Heparin  GERD Continue  famotidine  Chronic constipation Continue Linzess  Bipolar, depression and restless leg syndrome Continue lamotrigine, ropinirole, and clonazepam.     DVT prophylaxis: Heparin drip Code Status:  full Family Communication: Spoke to son [name] on 11/13/19 Disposition Plan/ discharge barriers: Patient from home, barriers for discharge sepsis requiring IV antibiotic therapy. Plan to return home when sepsis resolves. Still with AKI requiring IVF   Antimicrobials:   Ceftriaxone.     Subjective: Patient still with lower abd pain, bilateral flank pain, generalized weakness. Denies any chest pain, SOB, fever/chills    Objective: Vitals:   11/14/19 0354 11/14/19 0400 11/14/19 0612 11/14/19 1423  BP: (!) 170/75 (!) 165/90 (!) 164/77 (!) 159/75  Pulse: 83  85 90  Resp: 20  18 18   Temp: 98.6 F (37 C)  (!) 97.4 F (36.3 C) 98.9 F (37.2 C)  TempSrc: Oral  Oral Axillary  SpO2: 97%  95% 97%  Weight:      Height:        Intake/Output Summary (Last 24 hours) at 11/14/2019 1800 Last data filed at 11/14/2019 1300 Gross per 24 hour  Intake 840 ml  Output --  Net 840 ml   Filed Weights   11/11/19 0010  Weight: 71.7 kg    Examination:   General: NAD, deconditioned acutely ill appearing  Cardiovascular: S1, S2 present  Respiratory: CTAB  Abdomen: Soft, +tender, slightly distended, bowel sounds present  Musculoskeletal: No bilateral pedal edema noted  Skin: Normal  Psychiatry: Normal mood     Data Reviewed: I have personally reviewed following labs and imaging studies  CBC: Recent Labs  Lab 11/10/19 1512 11/10/19 1626 11/12/19 0131 11/13/19 0934 11/14/19 0708  WBC 16.5* 17.1* 18.5* 11.9* 11.4*  NEUTROABS  --  15.6* 16.6* 10.2* 9.4*  HGB 12.4 12.3 11.6* 12.3 12.2  HCT 37.5 38.2 34.1* 37.1 36.2  MCV 89.8 92.3 87.0 88.8 85.8  PLT  --  244 156 125* 123456*   Basic Metabolic Panel: Recent Labs  Lab 11/10/19 1626 11/11/19 1644 11/12/19 0131 11/13/19 0934  11/14/19 0708  NA 127* 127* 127* 129* 130*  K 4.1 3.9 4.2 3.6 3.5  CL 93* 95* 94* 97* 101  CO2 22 19* 20* 16* 15*  GLUCOSE 154* 108* 115* 108* 110*  BUN 27* 43* 49* 62* 61*  CREATININE 2.59* 3.71* 4.11* 3.99* 3.61*  CALCIUM 8.9 8.2* 8.2* 8.3* 8.3*   GFR: Estimated Creatinine Clearance: 13.4 mL/min (A) (by C-G formula based on SCr of 3.61 mg/dL (H)). Liver Function Tests: Recent Labs  Lab 11/10/19 1514 11/10/19 1626 11/12/19 0131  AST 48* 51* 38  ALT 132* 137* 84*  ALKPHOS 160* 136* 169*  BILITOT 0.5 1.1 0.7  PROT 6.5 7.4 6.0*  ALBUMIN 3.7 3.4* 2.4*   No results for input(s): LIPASE, AMYLASE in the last 168 hours. No results for input(s): AMMONIA in the last 168 hours. Coagulation Profile: Recent Labs  Lab 11/10/19 1626  INR 1.5*   Cardiac Enzymes: No results for input(s): CKTOTAL, CKMB, CKMBINDEX, TROPONINI in the last 168 hours. BNP (last 3 results) Recent Labs    03/10/19 1438  PROBNP 263   HbA1C: No results for input(s): HGBA1C in the last 72 hours. CBG: No results for input(s): GLUCAP in the last 168 hours. Lipid Profile: No results for input(s): CHOL, HDL, LDLCALC, TRIG, CHOLHDL, LDLDIRECT in the last 72 hours. Thyroid Function Tests: No results for input(s): TSH, T4TOTAL, FREET4, T3FREE, THYROIDAB in the last 72 hours. Anemia Panel: No results for input(s): VITAMINB12, FOLATE, FERRITIN, TIBC, IRON, RETICCTPCT in the last 72 hours.    Radiology Studies: I have reviewed all of the imaging during this hospital visit personally     Scheduled Meds: . amLODipine  10 mg Oral Daily  . DULoxetine  30 mg Oral QPM  . famotidine  20 mg Oral QHS  . lamoTRIgine  75 mg Oral Daily  . linaclotide  145 mcg Oral QAC breakfast  . Melatonin  18 mg Oral QHS  . rOPINIRole  3 mg Oral QHS  . sodium bicarbonate  650 mg Oral BID   Continuous Infusions: . sodium chloride 100 mL/hr at 11/14/19 0051  . cefTRIAXone (ROCEPHIN)  IV 2 g (11/13/19 2108)  . heparin 1,800  Units/hr (11/14/19 1310)     LOS: 3 days        Alma Friendly, MD

## 2019-11-14 NOTE — Progress Notes (Signed)
ANTICOAGULATION CONSULT NOTE  Pharmacy Consult:  Heparin Indication: PAD  No Known Allergies  Patient Measurements: Height: 5\' 8"  (172.7 cm) Weight: 158 lb 1.1 oz (71.7 kg) IBW/kg (Calculated) : 63.9  Heparin dosing weight: 71.7 kg   Vital Signs: Temp: 97.4 F (36.3 C) (03/05 0612) Temp Source: Oral (03/05 0612) BP: 164/77 (03/05 0612) Pulse Rate: 85 (03/05 0612)  Labs: Recent Labs    11/12/19 0131 11/12/19 2214 11/13/19 0934 11/13/19 2123 11/14/19 0708 11/14/19 0947  HGB 11.6*  --  12.3  --  12.2  --   HCT 34.1*  --  37.1  --  36.2  --   PLT 156  --  125*  --  105*  --   APTT  --    < > 40* 61*  --  57*  HEPARINUNFRC  --    < > 0.16* 0.24*  --  0.25*  CREATININE 4.11*  --  3.99*  --  3.61*  --    < > = values in this interval not displayed.    Estimated Creatinine Clearance: 13.4 mL/min (A) (by C-G formula based on SCr of 3.61 mg/dL (H)).  Assessment: 74 year old female on Xarelto PTA for PAD, last dose 2/28.  Xarelto held on admission 3/1 for hematuria, now to begin heparin due to AKI.    Heparin level 0.25 Plt 105  Goal of Therapy:  Heparin level 0.3-0.7 units/ml Monitor platelets by anticoagulation protocol: Yes  PTT 66 to 102 seconds   Plan:  Increase heparin gtt to 1800 units/hr 8 hour heparin level  Thank you Anette Guarneri, PharmD 11/14/2019, 12:44 PM

## 2019-11-15 LAB — CBC WITH DIFFERENTIAL/PLATELET
Abs Immature Granulocytes: 0.34 10*3/uL — ABNORMAL HIGH (ref 0.00–0.07)
Basophils Absolute: 0 10*3/uL (ref 0.0–0.1)
Basophils Relative: 0 %
Eosinophils Absolute: 0 10*3/uL (ref 0.0–0.5)
Eosinophils Relative: 0 %
HCT: 34.4 % — ABNORMAL LOW (ref 36.0–46.0)
Hemoglobin: 11.9 g/dL — ABNORMAL LOW (ref 12.0–15.0)
Immature Granulocytes: 3 %
Lymphocytes Relative: 10 %
Lymphs Abs: 1.4 10*3/uL (ref 0.7–4.0)
MCH: 29.1 pg (ref 26.0–34.0)
MCHC: 34.6 g/dL (ref 30.0–36.0)
MCV: 84.1 fL (ref 80.0–100.0)
Monocytes Absolute: 1.6 10*3/uL — ABNORMAL HIGH (ref 0.1–1.0)
Monocytes Relative: 12 %
Neutro Abs: 10 10*3/uL — ABNORMAL HIGH (ref 1.7–7.7)
Neutrophils Relative %: 75 %
Platelets: 105 10*3/uL — ABNORMAL LOW (ref 150–400)
RBC: 4.09 MIL/uL (ref 3.87–5.11)
RDW: 14.3 % (ref 11.5–15.5)
WBC: 13.4 10*3/uL — ABNORMAL HIGH (ref 4.0–10.5)
nRBC: 0 % (ref 0.0–0.2)

## 2019-11-15 LAB — BASIC METABOLIC PANEL
Anion gap: 13 (ref 5–15)
BUN: 53 mg/dL — ABNORMAL HIGH (ref 8–23)
CO2: 16 mmol/L — ABNORMAL LOW (ref 22–32)
Calcium: 8.2 mg/dL — ABNORMAL LOW (ref 8.9–10.3)
Chloride: 103 mmol/L (ref 98–111)
Creatinine, Ser: 3.23 mg/dL — ABNORMAL HIGH (ref 0.44–1.00)
GFR calc Af Amer: 15 mL/min — ABNORMAL LOW (ref >60)
GFR calc non Af Amer: 13 mL/min — ABNORMAL LOW (ref >60)
Glucose, Bld: 121 mg/dL — ABNORMAL HIGH (ref 70–99)
Potassium: 3.3 mmol/L — ABNORMAL LOW (ref 3.5–5.1)
Sodium: 132 mmol/L — ABNORMAL LOW (ref 135–145)

## 2019-11-15 LAB — HEPARIN LEVEL (UNFRACTIONATED): Heparin Unfractionated: 0.44 IU/mL (ref 0.30–0.70)

## 2019-11-15 NOTE — Plan of Care (Signed)

## 2019-11-15 NOTE — Progress Notes (Signed)
ANTICOAGULATION CONSULT NOTE  Pharmacy Consult:  Heparin Indication: PAD  No Known Allergies  Patient Measurements: Height: 5\' 8"  (172.7 cm) Weight: 158 lb 1.1 oz (71.7 kg) IBW/kg (Calculated) : 63.9  Heparin dosing weight: 71.7 kg   Vital Signs: Temp: 97.5 F (36.4 C) (03/06 0917) Temp Source: Oral (03/06 0917) BP: 141/75 (03/06 0917) Pulse Rate: 92 (03/06 0917)  Labs: Recent Labs    11/13/19 0934 11/13/19 0934 11/13/19 2123 11/14/19 0708 11/14/19 0947 11/15/19 0750  HGB 12.3   < >  --  12.2  --  11.9*  HCT 37.1  --   --  36.2  --  34.4*  PLT 125*  --   --  105*  --  105*  APTT 40*  --  61*  --  57*  --   HEPARINUNFRC 0.16*   < > 0.24*  --  0.25* 0.44  CREATININE 3.99*  --   --  3.61*  --  3.23*   < > = values in this interval not displayed.    Estimated Creatinine Clearance: 14.9 mL/min (A) (by C-G formula based on SCr of 3.23 mg/dL (H)).  Assessment: 74 year old female on Xarelto PTA for PAD, last dose 2/28.  Xarelto held on admission 3/1 for hematuria, now to begin heparin due to AKI.    Heparin level is therapeutic at 0.44 after rate increase to 1800 units/hr. CBC stable, Plt low 105. No bleeding noted per RN.  Goal of Therapy:  Heparin level 0.3-0.7 units/ml Monitor platelets by anticoagulation protocol: Yes    Plan:  Continue heparin infusion at 1800 units/hr Daily heparin level, CBC, s/sx of bleeding  Thank you Berenice Bouton, PharmD PGY1 Pharmacy Resident  Please check AMION for all [facility] phone numbers After 10:00 PM, call [facility] [facility] [phone]  11/15/2019, 9:32 AM

## 2019-11-15 NOTE — Progress Notes (Signed)
PROGRESS NOTE    [name]  [id number] DOB: 03-06-1946 DOA: 11/10/2019 PCP: [name], MD    Brief Narrative:  74 year old female who presented with urinary tract infection.  She does have significant past medical history for abdominal aortic aneurysm, history of CVA, coronary artery disease, hypertension, gastritis, bipolar and depression. Patient reported 1 week of dysuria, increased urinary frequency, suprapubic abdominal pain, left-sided flank pain and chills. Symptoms associated with low-grade fever and nausea.  Patient was seen by her primary care provider who referred her to emergency room for evaluation. CT abdomen and pelvis with no acute findings no obstructive uropathy.  Chest radiograph with hyperinflation, bibasilar atelectasis and increased lung markings bilaterally. Patient has been admitted to the medical ward, started on supportive fluids, and IV antibiotic therapy. Admission blood cultures positive for E. Coli.     Assessment & Plan:   Principal Problem:   Complicated UTI (urinary tract infection) Active Problems:   HTN (hypertension)   AKI (acute kidney injury) ([place])   Hyponatremia   Elevated LFTs   Sepsis ([place])   Sepsis due to left pyelonephritis, end organ failure AKI, present on admission E coli bacteremia Currently afebrile, with leukocytosis BC x2 growing E. Coli, awaiting final report CT with no obstructive uropathy Continue IV ceftriaxone Continue IV fluids Monitor closely  AKI with hyponatremia and non gap metabolic acidosis Slowly improving IV fluids Daily BMP  HTN Restarted amlodipine, continue to hold home losartan  History of peripheral vascular disease with possible atheroemboli Patient was on Xarelto 20 mg daily Due to AKI currently, unable to take Xarelto, discussed with Dr. Oneida Alar on 11/12/2019 okay to switch to IV Heparin for now pending improvement of renal function Continue IV Heparin  GERD Continue  famotidine  Chronic constipation Continue Linzess  Bipolar, depression and restless leg syndrome Continue lamotrigine, ropinirole, and clonazepam.     DVT prophylaxis: Heparin drip Code Status:  full Family Communication: Spoke to son [name] on 11/13/19 Disposition Plan/ discharge barriers: Patient from home, barriers for discharge sepsis requiring IV antibiotic therapy. Plan to return home when sepsis resolves. Still with AKI requiring IVF, PT/OT pending evaluation   Antimicrobials:   Ceftriaxone.     Subjective: Patient still with generalized weakness, denies any new complaints    Objective: Vitals:   11/14/19 2141 11/15/19 0451 11/15/19 0917 11/15/19 1223  BP: (!) 166/75 (!) 163/76 (!) 141/75 (!) 144/71  Pulse: 93 88 92 83  Resp: 18 20    Temp: 98.5 F (36.9 C) 98.6 F (37 C) (!) 97.5 F (36.4 C) 97.7 F (36.5 C)  TempSrc: Oral Oral Oral Oral  SpO2: 93% 92% (!) 86% 94%  Weight:      Height:        Intake/Output Summary (Last 24 hours) at 11/15/2019 1358 Last data filed at 11/15/2019 0900 Gross per 24 hour  Intake 880 ml  Output --  Net 880 ml   Filed Weights   11/11/19 0010  Weight: 71.7 kg    Examination:   General: NAD, deconditioned  Cardiovascular: S1, S2 present  Respiratory: Diminished BS b/l  Abdomen: Soft, nontender, nondistended, bowel sounds present  Musculoskeletal: No bilateral pedal edema noted  Skin: Normal  Psychiatry: Normal mood     Data Reviewed: I have personally reviewed following labs and imaging studies  CBC: Recent Labs  Lab 11/10/19 1626 11/12/19 0131 11/13/19 0934 11/14/19 0708 11/15/19 0750  WBC 17.1* 18.5* 11.9* 11.4* 13.4*  NEUTROABS 15.6* 16.6* 10.2* 9.4* 10.0*  HGB 12.3 11.6* 12.3 12.2 11.9*  HCT 38.2 34.1* 37.1 36.2 34.4*  MCV 92.3 87.0 88.8 85.8 84.1  PLT 244 156 125* 105* 123456*   Basic Metabolic Panel: Recent Labs  Lab 11/11/19 1644 11/12/19 0131 11/13/19 0934 11/14/19 0708 11/15/19 0750  NA  127* 127* 129* 130* 132*  K 3.9 4.2 3.6 3.5 3.3*  CL 95* 94* 97* 101 103  CO2 19* 20* 16* 15* 16*  GLUCOSE 108* 115* 108* 110* 121*  BUN 43* 49* 62* 61* 53*  CREATININE 3.71* 4.11* 3.99* 3.61* 3.23*  CALCIUM 8.2* 8.2* 8.3* 8.3* 8.2*   GFR: Estimated Creatinine Clearance: 14.9 mL/min (A) (by C-G formula based on SCr of 3.23 mg/dL (H)). Liver Function Tests: Recent Labs  Lab 11/10/19 1514 11/10/19 1626 11/12/19 0131  AST 48* 51* 38  ALT 132* 137* 84*  ALKPHOS 160* 136* 169*  BILITOT 0.5 1.1 0.7  PROT 6.5 7.4 6.0*  ALBUMIN 3.7 3.4* 2.4*   No results for input(s): LIPASE, AMYLASE in the last 168 hours. No results for input(s): AMMONIA in the last 168 hours. Coagulation Profile: Recent Labs  Lab 11/10/19 1626  INR 1.5*   Cardiac Enzymes: No results for input(s): CKTOTAL, CKMB, CKMBINDEX, TROPONINI in the last 168 hours. BNP (last 3 results) Recent Labs    03/10/19 1438  PROBNP 263   HbA1C: No results for input(s): HGBA1C in the last 72 hours. CBG: No results for input(s): GLUCAP in the last 168 hours. Lipid Profile: No results for input(s): CHOL, HDL, LDLCALC, TRIG, CHOLHDL, LDLDIRECT in the last 72 hours. Thyroid Function Tests: No results for input(s): TSH, T4TOTAL, FREET4, T3FREE, THYROIDAB in the last 72 hours. Anemia Panel: No results for input(s): VITAMINB12, FOLATE, FERRITIN, TIBC, IRON, RETICCTPCT in the last 72 hours.    Radiology Studies: I have reviewed all of the imaging during this hospital visit personally     Scheduled Meds: . amLODipine  10 mg Oral Daily  . DULoxetine  30 mg Oral QPM  . famotidine  20 mg Oral QHS  . lamoTRIgine  75 mg Oral Daily  . linaclotide  145 mcg Oral QAC breakfast  . Melatonin  18 mg Oral QHS  . rOPINIRole  3 mg Oral QHS  . sodium bicarbonate  650 mg Oral BID   Continuous Infusions: . sodium chloride 100 mL/hr at 11/15/19 0456  . cefTRIAXone (ROCEPHIN)  IV 2 g (11/14/19 2142)  . heparin 1,800 Units/hr (11/15/19  1223)     LOS: 4 days        Alma Friendly, MD

## 2019-11-16 LAB — CBC WITH DIFFERENTIAL/PLATELET
Abs Immature Granulocytes: 0.63 10*3/uL — ABNORMAL HIGH (ref 0.00–0.07)
Basophils Absolute: 0.1 10*3/uL (ref 0.0–0.1)
Basophils Relative: 0 %
Eosinophils Absolute: 0 10*3/uL (ref 0.0–0.5)
Eosinophils Relative: 0 %
HCT: 32.2 % — ABNORMAL LOW (ref 36.0–46.0)
Hemoglobin: 11.2 g/dL — ABNORMAL LOW (ref 12.0–15.0)
Immature Granulocytes: 4 %
Lymphocytes Relative: 8 %
Lymphs Abs: 1.3 10*3/uL (ref 0.7–4.0)
MCH: 29.2 pg (ref 26.0–34.0)
MCHC: 34.8 g/dL (ref 30.0–36.0)
MCV: 83.9 fL (ref 80.0–100.0)
Monocytes Absolute: 1.5 10*3/uL — ABNORMAL HIGH (ref 0.1–1.0)
Monocytes Relative: 10 %
Neutro Abs: 12.1 10*3/uL — ABNORMAL HIGH (ref 1.7–7.7)
Neutrophils Relative %: 78 %
Platelets: 115 10*3/uL — ABNORMAL LOW (ref 150–400)
RBC: 3.84 MIL/uL — ABNORMAL LOW (ref 3.87–5.11)
RDW: 14.3 % (ref 11.5–15.5)
WBC: 15.6 10*3/uL — ABNORMAL HIGH (ref 4.0–10.5)
nRBC: 0 % (ref 0.0–0.2)

## 2019-11-16 LAB — BASIC METABOLIC PANEL
Anion gap: 13 (ref 5–15)
BUN: 48 mg/dL — ABNORMAL HIGH (ref 8–23)
CO2: 16 mmol/L — ABNORMAL LOW (ref 22–32)
Calcium: 8 mg/dL — ABNORMAL LOW (ref 8.9–10.3)
Chloride: 103 mmol/L (ref 98–111)
Creatinine, Ser: 2.96 mg/dL — ABNORMAL HIGH (ref 0.44–1.00)
GFR calc Af Amer: 17 mL/min — ABNORMAL LOW (ref >60)
GFR calc non Af Amer: 15 mL/min — ABNORMAL LOW (ref >60)
Glucose, Bld: 126 mg/dL — ABNORMAL HIGH (ref 70–99)
Potassium: 3 mmol/L — ABNORMAL LOW (ref 3.5–5.1)
Sodium: 132 mmol/L — ABNORMAL LOW (ref 135–145)

## 2019-11-16 LAB — SUSCEPTIBILITY RESULT

## 2019-11-16 LAB — SUSCEPTIBILITY, AER + ANAEROB

## 2019-11-16 LAB — HEPARIN LEVEL (UNFRACTIONATED)
Heparin Unfractionated: 0.25 IU/mL — ABNORMAL LOW (ref 0.30–0.70)
Heparin Unfractionated: 0.49 IU/mL (ref 0.30–0.70)

## 2019-11-16 LAB — TROPONIN I (HIGH SENSITIVITY)
Troponin I (High Sensitivity): 25 ng/L — ABNORMAL HIGH (ref <18)
Troponin I (High Sensitivity): 23 ng/L — ABNORMAL HIGH (ref <18)

## 2019-11-16 MED ORDER — IPRATROPIUM-ALBUTEROL 0.5-2.5 (3) MG/3ML IN SOLN: 3.0000 mL | Freq: Four times a day (QID) | RESPIRATORY_TRACT | Status: DC

## 2019-11-16 MED ORDER — IPRATROPIUM-ALBUTEROL 0.5-2.5 (3) MG/3ML IN SOLN: 3.0000 mL | RESPIRATORY_TRACT | Status: DC | PRN

## 2019-11-16 MED ORDER — POTASSIUM CHLORIDE CRYS ER 20 MEQ PO TBCR
40.0000 meq | EXTENDED_RELEASE_TABLET | Freq: Two times a day (BID) | ORAL | Status: AC
  Administered 2019-11-16 (×2): 40 meq via ORAL
  Filled 2019-11-16 (×2): qty 2

## 2019-11-16 MED ORDER — MOMETASONE FURO-FORMOTEROL FUM 100-5 MCG/ACT IN AERO
2.0000 | INHALATION_SPRAY | Freq: Two times a day (BID) | RESPIRATORY_TRACT | Status: DC
  Administered 2019-11-18 – 2019-11-27 (×17): 2 via RESPIRATORY_TRACT
  Filled 2019-11-16 (×2): qty 8.8

## 2019-11-16 NOTE — Plan of Care (Signed)
  Problem: Pain Managment: Goal: General experience of comfort will improve Outcome: Progressing   Problem: Elimination: Goal: Will not experience complications related to bowel motility Outcome: Progressing   Problem: Activity: Goal: Risk for activity intolerance will decrease Outcome: Progressing   Pt bowels moving today, loose. Pt up to chair today, tolerated well. Back pain this evening, back to bed and pain medication given. Pt resting in bed.

## 2019-11-16 NOTE — Progress Notes (Signed)
PT Cancellation Note  Patient Details Name: [name] MRN: [id number] DOB: 1946/06/02   Cancelled Treatment:    Reason Eval/Treat Not Completed: Patient declined, no reason specified  Pt refused to work with PT today stating her son was supposed to be coming and there was "business that needed to be handled". Asked if we could work in room to see mobility, pt refused, asking for PT to come back tomorrow. Educated on importance of mobility and patient asked to please just come back tomorrow.  PT will attempt to evaluate again tomorrow.  Ann Held PT, DPT Acute Rehab [place] P: [place] 11/16/2019, 3:16 PM

## 2019-11-16 NOTE — Progress Notes (Signed)
ANTICOAGULATION CONSULT NOTE  Pharmacy Consult:  Heparin Indication: PAD  No Known Allergies  Patient Measurements: Height: 5\' 8"  (172.7 cm) Weight: 158 lb 1.1 oz (71.7 kg) IBW/kg (Calculated) : 63.9  Heparin dosing weight: 71.7 kg   Vital Signs: Temp: 98.6 F (37 C) (03/07 0453) Temp Source: Oral (03/07 0453) BP: 156/66 (03/07 0453) Pulse Rate: 93 (03/07 0453)  Labs: Recent Labs    11/13/19 2123 11/13/19 2123 11/14/19 0708 11/14/19 0708 11/14/19 [id number] 11/15/19 0750 11/16/19 0246 11/16/19 0623 11/16/19 0808  HGB  --   --  12.2   < >  --  11.9* 11.2*  --   --   HCT  --   --  36.2  --   --  34.4* 32.2*  --   --   PLT  --   --  105*  --   --  105* 115*  --   --   APTT 61*  --   --   --  57*  --   --   --   --   HEPARINUNFRC 0.24*   < >  --   --  0.25* 0.44 0.25*  --   --   CREATININE  --   --  3.61*  --   --  3.23* 2.96*  --   --   TROPONINIHS  --   --   --   --   --   --   --  25* 23*   < > = values in this interval not displayed.    Estimated Creatinine Clearance: 16.3 mL/min (A) (by C-G formula based on SCr of 2.96 mg/dL (H)).  Assessment: 74 year old female on Xarelto PTA for PAD, last dose 2/28.  Xarelto held on admission 3/1 for hematuria, now to begin heparin due to AKI.    Heparin level is therapeutic after rate increase to 2000 units/hr. No bleeding noted per RN.   Goal of Therapy:  Heparin level 0.3-0.7 units/ml Monitor platelets by anticoagulation protocol: Yes    Plan:  Continue heparin infusion at 2000 units/hr Confirmatory heparin level in 8 hours Daily heparin level, CBC, s/sx of bleeding  Thank you Berenice Bouton, PharmD PGY1 Pharmacy Resident  Please check AMION for all [place] phone numbers After 10:00 PM, call [place] [phone]  11/16/2019, 11:06 AM

## 2019-11-16 NOTE — Progress Notes (Signed)
NT reported that pt. stated she was having chest pain. When this RN asked pt. to describe the pain she stated that it radiated down to her left arm. Paged [place], NP and received orders for stat EKG and Troponin labs. Will implement these orders and continue to monitor pt.

## 2019-11-16 NOTE — Progress Notes (Signed)
OT Cancellation Note  Patient Details Name: [name] MRN: [id number] DOB: 1946-05-22   Cancelled Treatment:    Reason Eval/Treat Not Completed: talking on the phone to "a visitor".  Pt reports that this is not a good time, as she is talking on the phone, and requests OT return later.  Will reattempt as schedule allows.  Nilsa Nutting., OTR/L Acute Rehabilitation Services Pager [phone] Office [phone]   Lucille Passy M 11/16/2019, 12:40 PM

## 2019-11-16 NOTE — Progress Notes (Signed)
ANTICOAGULATION CONSULT NOTE - Follow Up Consult  Pharmacy Consult for heparin Indication: PAD  Labs: Recent Labs    11/13/19 0934 11/13/19 0934 11/13/19 2123 11/13/19 2123 11/14/19 0708 11/14/19 0947 11/15/19 0750 11/16/19 0246  HGB 12.3   < >  --   --  12.2  --  11.9*  --   HCT 37.1  --   --   --  36.2  --  34.4*  --   PLT 125*  --   --   --  105*  --  105*  --   APTT 40*  --  61*  --   --  57*  --   --   HEPARINUNFRC 0.16*   < > 0.24*   < >  --  0.25* 0.44 0.25*  CREATININE 3.99*  --   --   --  3.61*  --  3.23*  --    < > = values in this interval not displayed.    Assessment: 74yo female subtherapeutic on heparin after one level at goal; no gtt issues or signs of bleeding per RN.  Goal of Therapy:  Heparin level 0.3-0.7 units/ml   Plan:  Will increase heparin gtt by 10% to 2000 units/hr and check level in 8 hours.    Wynona Neat, PharmD, BCPS  11/16/2019,4:22 AM

## 2019-11-16 NOTE — Progress Notes (Addendum)
PROGRESS NOTE    [name]  [id number] DOB: 03-10-1946 DOA: 11/10/2019 PCP: [name], MD    Brief Narrative:  74 year old female who presented with urinary tract infection.  She does have significant past medical history for abdominal aortic aneurysm, history of CVA, coronary artery disease, hypertension, gastritis, bipolar and depression. Patient reported 1 week of dysuria, increased urinary frequency, suprapubic abdominal pain, left-sided flank pain and chills. Symptoms associated with low-grade fever and nausea.  Patient was seen by her primary care provider who referred her to emergency room for evaluation. CT abdomen and pelvis with no acute findings no obstructive uropathy.  Chest radiograph with hyperinflation, bibasilar atelectasis and increased lung markings bilaterally. Patient has been admitted to the medical ward, started on supportive fluids, and IV antibiotic therapy. Admission blood cultures positive for E. Coli.     Assessment & Plan:   Principal Problem:   Complicated UTI (urinary tract infection) Active Problems:   HTN (hypertension)   AKI (acute kidney injury) ([place])   Hyponatremia   Elevated LFTs   Sepsis ([place])   Sepsis due to left pyelonephritis, end organ failure AKI, present on admission E coli bacteremia Currently afebrile, with leukocytosis BC x2 growing E. Coli, awaiting final report Repeat BC X 2 pending UA with large leukocytes, UC pending collection CT with no obstructive uropathy Continue IV ceftriaxone D/C IV fluids Monitor closely  AKI with hyponatremia and non gap metabolic acidosis Slowly improving D/C IV fluids Daily BMP  Hypokalemia Replace prn  HTN Restarted amlodipine, continue to hold home losartan  History of peripheral vascular disease with possible atheroemboli Patient was on Xarelto 20 mg daily Due to AKI currently, unable to take Xarelto, discussed with Dr. Oneida Alar on 11/12/2019 okay to switch to IV Heparin for  now pending improvement of renal function Continue IV Heparin  GERD Continue famotidine  Possible Hx of COPD Noted some desaturations on RA with cough Pt is a current smoker, 1PPD, not on any inhaler at home CXR with emphysema, ILD changes, nothing acute  Duoneb scheduled, prn  Chronic constipation Continue Linzess  Bipolar, depression and restless leg syndrome Continue lamotrigine, ropinirole, and clonazepam      DVT prophylaxis: Heparin drip Code Status:  full Family Communication: Spoke to son [name] on 11/15/19 Disposition Plan/ discharge barriers: Patient from home, barriers for discharge, sepsis requiring IV antibiotic therapy. Still with AKI requiring IVF, PT/OT pending evaluation   Antimicrobials:   Ceftriaxone.     Subjective: Early this am, pt reported sharp L sided chest pain that lasted for a few secs and went away. Reports cough. Currently denies any chest pain, SOB, abdominal pain, fever/chills, N/V. Noted to have poor appetite, dry oral cavity    Objective: Vitals:   11/15/19 1223 11/15/19 2047 11/16/19 0453 11/16/19 0459  BP: (!) 144/71 (!) 143/82 (!) 156/66   Pulse: 83 95 93   Resp:  18 18   Temp: 97.7 F (36.5 C) 98.7 F (37.1 C) 98.6 F (37 C)   TempSrc: Oral Oral Oral   SpO2: 94% (!) 85% (!) 80% 92%  Weight:      Height:        Intake/Output Summary (Last 24 hours) at 11/16/2019 1053 Last data filed at 11/15/2019 1514 Gross per 24 hour  Intake 2253.8 ml  Output --  Net 2253.8 ml   Filed Weights   11/11/19 0010  Weight: 71.7 kg    Examination:   General: NAD, deconditioned  Cardiovascular: S1, S2 present  Respiratory: Diminished BS b/l, with some inspiratory crackles  Abdomen: Soft, nontender, nondistended, bowel sounds present  Musculoskeletal: No bilateral pedal edema noted  Skin: Normal  Psychiatry: Normal mood     Data Reviewed: I have personally reviewed following labs and imaging studies  CBC: Recent Labs  Lab  11/12/19 0131 11/13/19 0934 11/14/19 0708 11/15/19 0750 11/16/19 0246  WBC 18.5* 11.9* 11.4* 13.4* 15.6*  NEUTROABS 16.6* 10.2* 9.4* 10.0* 12.1*  HGB 11.6* 12.3 12.2 11.9* 11.2*  HCT 34.1* 37.1 36.2 34.4* 32.2*  MCV 87.0 88.8 85.8 84.1 83.9  PLT 156 125* 105* 105* [id number]   Basic Metabolic Panel: Recent Labs  Lab 11/12/19 0131 11/13/19 0934 11/14/19 0708 11/15/19 0750 11/16/19 0246  NA 127* 129* 130* 132* 132*  K 4.2 3.6 3.5 3.3* 3.0*  CL 94* 97* 101 103 103  CO2 20* 16* 15* 16* 16*  GLUCOSE 115* 108* 110* 121* 126*  BUN 49* 62* 61* 53* 48*  CREATININE 4.11* 3.99* 3.61* 3.23* 2.96*  CALCIUM 8.2* 8.3* 8.3* 8.2* 8.0*   GFR: Estimated Creatinine Clearance: 16.3 mL/min (A) (by C-G formula based on SCr of 2.96 mg/dL (H)). Liver Function Tests: Recent Labs  Lab 11/10/19 1514 11/10/19 1626 11/12/19 0131  AST 48* 51* 38  ALT 132* 137* 84*  ALKPHOS 160* 136* 169*  BILITOT 0.5 1.1 0.7  PROT 6.5 7.4 6.0*  ALBUMIN 3.7 3.4* 2.4*   No results for input(s): LIPASE, AMYLASE in the last 168 hours. No results for input(s): AMMONIA in the last 168 hours. Coagulation Profile: Recent Labs  Lab 11/10/19 1626  INR 1.5*   Cardiac Enzymes: No results for input(s): CKTOTAL, CKMB, CKMBINDEX, TROPONINI in the last 168 hours. BNP (last 3 results) Recent Labs    03/10/19 1438  PROBNP 263   HbA1C: No results for input(s): HGBA1C in the last 72 hours. CBG: No results for input(s): GLUCAP in the last 168 hours. Lipid Profile: No results for input(s): CHOL, HDL, LDLCALC, TRIG, CHOLHDL, LDLDIRECT in the last 72 hours. Thyroid Function Tests: No results for input(s): TSH, T4TOTAL, FREET4, T3FREE, THYROIDAB in the last 72 hours. Anemia Panel: No results for input(s): VITAMINB12, FOLATE, FERRITIN, TIBC, IRON, RETICCTPCT in the last 72 hours.    Radiology Studies: I have reviewed all of the imaging during this hospital visit personally     Scheduled Meds: . amLODipine  10 mg  Oral Daily  . DULoxetine  30 mg Oral QPM  . famotidine  20 mg Oral QHS  . lamoTRIgine  75 mg Oral Daily  . linaclotide  145 mcg Oral QAC breakfast  . Melatonin  18 mg Oral QHS  . rOPINIRole  3 mg Oral QHS  . sodium bicarbonate  650 mg Oral BID   Continuous Infusions: . sodium chloride 75 mL/hr at 11/15/19 1449  . cefTRIAXone (ROCEPHIN)  IV 2 g (11/15/19 2212)  . heparin 2,000 Units/hr (11/16/19 0422)     LOS: 5 days        Alma Friendly, MD

## 2019-11-17 ENCOUNTER — Inpatient Hospital Stay (HOSPITAL_COMMUNITY): Payer: Medicare Other

## 2019-11-17 DIAGNOSIS — J9601 Acute respiratory failure with hypoxia: Secondary | ICD-10-CM

## 2019-11-17 DIAGNOSIS — R0902 Hypoxemia: Secondary | ICD-10-CM

## 2019-11-17 DIAGNOSIS — R4182 Altered mental status, unspecified: Secondary | ICD-10-CM

## 2019-11-17 DIAGNOSIS — N390 Urinary tract infection, site not specified: Secondary | ICD-10-CM

## 2019-11-17 DIAGNOSIS — R918 Other nonspecific abnormal finding of lung field: Secondary | ICD-10-CM

## 2019-11-17 LAB — POCT I-STAT 7, (LYTES, BLD GAS, ICA,H+H)
Acid-base deficit: 9 mmol/L — ABNORMAL HIGH (ref 0.0–2.0)
Acid-base deficit: 8 mmol/L — ABNORMAL HIGH (ref 0.0–2.0)
Bicarbonate: 19.3 mmol/L — ABNORMAL LOW (ref 20.0–28.0)
Bicarbonate: 19.5 mmol/L — ABNORMAL LOW (ref 20.0–28.0)
Calcium, Ion: 1.16 mmol/L (ref 1.15–1.40)
Calcium, Ion: 1.17 mmol/L (ref 1.15–1.40)
HCT: 33 % — ABNORMAL LOW (ref 36.0–46.0)
HCT: 30 % — ABNORMAL LOW (ref 36.0–46.0)
Hemoglobin: 11.2 g/dL — ABNORMAL LOW (ref 12.0–15.0)
Hemoglobin: 10.2 g/dL — ABNORMAL LOW (ref 12.0–15.0)
O2 Saturation: 92 %
O2 Saturation: 100 %
Patient temperature: 99.6
Patient temperature: 98.6
Potassium: 3.1 mmol/L — ABNORMAL LOW (ref 3.5–5.1)
Potassium: 3.6 mmol/L (ref 3.5–5.1)
Sodium: 135 mmol/L (ref 135–145)
Sodium: 136 mmol/L (ref 135–145)
TCO2: 21 mmol/L — ABNORMAL LOW (ref 22–32)
TCO2: 21 mmol/L — ABNORMAL LOW (ref 22–32)
pCO2 arterial: 54 mmHg — ABNORMAL HIGH (ref 32.0–48.0)
pCO2 arterial: 47.4 mmHg (ref 32.0–48.0)
pH, Arterial: 7.164 — CL (ref 7.350–7.450)
pH, Arterial: 7.222 — ABNORMAL LOW (ref 7.350–7.450)
pO2, Arterial: 83 mmHg (ref 83.0–108.0)
pO2, Arterial: 211 mmHg — ABNORMAL HIGH (ref 83.0–108.0)

## 2019-11-17 LAB — BASIC METABOLIC PANEL
Anion gap: 11 (ref 5–15)
Anion gap: 13 (ref 5–15)
BUN: 44 mg/dL — ABNORMAL HIGH (ref 8–23)
BUN: 45 mg/dL — ABNORMAL HIGH (ref 8–23)
CO2: 17 mmol/L — ABNORMAL LOW (ref 22–32)
CO2: 16 mmol/L — ABNORMAL LOW (ref 22–32)
Calcium: 7.6 mg/dL — ABNORMAL LOW (ref 8.9–10.3)
Calcium: 7.5 mg/dL — ABNORMAL LOW (ref 8.9–10.3)
Chloride: 103 mmol/L (ref 98–111)
Chloride: 103 mmol/L (ref 98–111)
Creatinine, Ser: 2.61 mg/dL — ABNORMAL HIGH (ref 0.44–1.00)
Creatinine, Ser: 2.74 mg/dL — ABNORMAL HIGH (ref 0.44–1.00)
GFR calc Af Amer: 20 mL/min — ABNORMAL LOW (ref >60)
GFR calc Af Amer: 19 mL/min — ABNORMAL LOW (ref >60)
GFR calc non Af Amer: 17 mL/min — ABNORMAL LOW (ref >60)
GFR calc non Af Amer: 16 mL/min — ABNORMAL LOW (ref >60)
Glucose, Bld: 137 mg/dL — ABNORMAL HIGH (ref 70–99)
Glucose, Bld: 165 mg/dL — ABNORMAL HIGH (ref 70–99)
Potassium: 3.4 mmol/L — ABNORMAL LOW (ref 3.5–5.1)
Potassium: 3.5 mmol/L (ref 3.5–5.1)
Sodium: 131 mmol/L — ABNORMAL LOW (ref 135–145)
Sodium: 132 mmol/L — ABNORMAL LOW (ref 135–145)

## 2019-11-17 LAB — BODY FLUID CELL COUNT WITH DIFFERENTIAL
Eos, Fluid: 1 %
Lymphs, Fluid: 5 %
Monocyte-Macrophage-Serous Fluid: 16 % — ABNORMAL LOW (ref 50–90)
Neutrophil Count, Fluid: 78 % — ABNORMAL HIGH (ref 0–25)
Total Nucleated Cell Count, Fluid: 23 cu mm (ref 0–1000)

## 2019-11-17 LAB — CBC WITH DIFFERENTIAL/PLATELET
Abs Immature Granulocytes: 0.78 10*3/uL — ABNORMAL HIGH (ref 0.00–0.07)
Basophils Absolute: 0.1 10*3/uL (ref 0.0–0.1)
Basophils Relative: 0 %
Eosinophils Absolute: 0.1 10*3/uL (ref 0.0–0.5)
Eosinophils Relative: 0 %
HCT: 30.6 % — ABNORMAL LOW (ref 36.0–46.0)
Hemoglobin: 10.7 g/dL — ABNORMAL LOW (ref 12.0–15.0)
Immature Granulocytes: 4 %
Lymphocytes Relative: 8 %
Lymphs Abs: 1.7 10*3/uL (ref 0.7–4.0)
MCH: 29.1 pg (ref 26.0–34.0)
MCHC: 35 g/dL (ref 30.0–36.0)
MCV: 83.2 fL (ref 80.0–100.0)
Monocytes Absolute: 1.2 10*3/uL — ABNORMAL HIGH (ref 0.1–1.0)
Monocytes Relative: 6 %
Neutro Abs: 17.5 10*3/uL — ABNORMAL HIGH (ref 1.7–7.7)
Neutrophils Relative %: 82 %
Platelets: 157 10*3/uL (ref 150–400)
RBC: 3.68 MIL/uL — ABNORMAL LOW (ref 3.87–5.11)
RDW: 14.3 % (ref 11.5–15.5)
WBC: 21.3 10*3/uL — ABNORMAL HIGH (ref 4.0–10.5)
nRBC: 0 % (ref 0.0–0.2)

## 2019-11-17 LAB — HEPARIN LEVEL (UNFRACTIONATED)
Heparin Unfractionated: 0.26 IU/mL — ABNORMAL LOW (ref 0.30–0.70)
Heparin Unfractionated: 0.52 IU/mL (ref 0.30–0.70)

## 2019-11-17 LAB — BLOOD GAS, ARTERIAL
Acid-base deficit: 7.1 mmol/L — ABNORMAL HIGH (ref 0.0–2.0)
Bicarbonate: 18.4 mmol/L — ABNORMAL LOW (ref 20.0–28.0)
Drawn by: 336832
FIO2: 100
O2 Saturation: 96.1 %
Patient temperature: 37
pCO2 arterial: 40.1 mmHg (ref 32.0–48.0)
pH, Arterial: 7.283 — ABNORMAL LOW (ref 7.350–7.450)
pO2, Arterial: 89.1 mmHg (ref 83.0–108.0)

## 2019-11-17 LAB — TRIGLYCERIDES: Triglycerides: 116 mg/dL (ref <150)

## 2019-11-17 LAB — BRAIN NATRIURETIC PEPTIDE: B Natriuretic Peptide: 277.7 pg/mL — ABNORMAL HIGH (ref 0.0–100.0)

## 2019-11-17 LAB — MAGNESIUM: Magnesium: 1.3 mg/dL — ABNORMAL LOW (ref 1.7–2.4)

## 2019-11-17 LAB — SEDIMENTATION RATE: Sed Rate: 80 mm/hr — ABNORMAL HIGH (ref 0–22)

## 2019-11-17 LAB — PROCALCITONIN: Procalcitonin: 2.8 ng/mL

## 2019-11-17 LAB — C-REACTIVE PROTEIN: CRP: 26 mg/dL — ABNORMAL HIGH (ref <1.0)

## 2019-11-17 MED ORDER — VANCOMYCIN HCL 1250 MG/250ML IV SOLN
1250.0000 mg | Freq: Once | INTRAVENOUS | Status: DC
  Filled 2019-11-17: qty 250

## 2019-11-17 MED ORDER — FENTANYL CITRATE (PF) 100 MCG/2ML IJ SOLN
INTRAMUSCULAR | Status: AC
  Administered 2019-11-17: 100 ug
  Filled 2019-11-17: qty 2

## 2019-11-17 MED ORDER — ORAL CARE MOUTH RINSE
15.0000 mL | OROMUCOSAL | Status: DC
  Administered 2019-11-17 – 2019-11-26 (×46): 15 mL via OROMUCOSAL

## 2019-11-17 MED ORDER — ETOMIDATE 2 MG/ML IV SOLN
20.0000 mg | Freq: Once | INTRAVENOUS | Status: AC
  Administered 2019-11-17: 20 mg via INTRAVENOUS

## 2019-11-17 MED ORDER — IPRATROPIUM-ALBUTEROL 0.5-2.5 (3) MG/3ML IN SOLN
3.0000 mL | Freq: Four times a day (QID) | RESPIRATORY_TRACT | Status: DC
  Administered 2019-11-17 – 2019-11-18 (×3): 3 mL via RESPIRATORY_TRACT
  Filled 2019-11-17 (×4): qty 3

## 2019-11-17 MED ORDER — METHYLPREDNISOLONE SODIUM SUCC 125 MG IJ SOLR
60.0000 mg | Freq: Four times a day (QID) | INTRAMUSCULAR | Status: DC
  Administered 2019-11-17 – 2019-11-19 (×7): 60 mg via INTRAVENOUS
  Filled 2019-11-17 (×7): qty 2

## 2019-11-17 MED ORDER — FUROSEMIDE 10 MG/ML IJ SOLN
INTRAMUSCULAR | Status: AC
  Filled 2019-11-17: qty 4

## 2019-11-17 MED ORDER — CHLORHEXIDINE GLUCONATE CLOTH 2 % EX PADS
6.0000 | MEDICATED_PAD | Freq: Every day | CUTANEOUS | Status: DC
  Administered 2019-11-17 – 2019-11-26 (×8): 6 via TOPICAL

## 2019-11-17 MED ORDER — CHLORHEXIDINE GLUCONATE 0.12% ORAL RINSE (MEDLINE KIT)
15.0000 mL | Freq: Two times a day (BID) | OROMUCOSAL | Status: DC
  Administered 2019-11-17 – 2019-11-26 (×15): 15 mL via OROMUCOSAL

## 2019-11-17 MED ORDER — POTASSIUM CHLORIDE CRYS ER 20 MEQ PO TBCR
40.0000 meq | EXTENDED_RELEASE_TABLET | Freq: Once | ORAL | Status: AC
  Administered 2019-11-17: 40 meq via ORAL
  Filled 2019-11-17: qty 2

## 2019-11-17 MED ORDER — CHLORHEXIDINE GLUCONATE 0.12% ORAL RINSE (MEDLINE KIT)
15.0000 mL | Freq: Two times a day (BID) | OROMUCOSAL | Status: DC
  Administered 2019-11-17: 15 mL via OROMUCOSAL

## 2019-11-17 MED ORDER — SODIUM CHLORIDE 0.9 % IV SOLN
INTRAVENOUS | Status: DC | PRN
  Administered 2019-11-17: 1000 mL via INTRAVENOUS

## 2019-11-17 MED ORDER — FUROSEMIDE 10 MG/ML IJ SOLN
40.0000 mg | Freq: Once | INTRAMUSCULAR | Status: AC
  Administered 2019-11-17: 40 mg via INTRAVENOUS

## 2019-11-17 MED ORDER — SODIUM CHLORIDE 0.9 % IV SOLN
2.0000 g | INTRAVENOUS | Status: DC
  Administered 2019-11-17 – 2019-11-19 (×3): 2 g via INTRAVENOUS
  Filled 2019-11-17 (×6): qty 2

## 2019-11-17 MED ORDER — ROCURONIUM BROMIDE 50 MG/5ML IV SOLN
50.0000 mg | Freq: Once | INTRAVENOUS | Status: AC
  Administered 2019-11-17: 50 mg via INTRAVENOUS

## 2019-11-17 MED ORDER — MIDAZOLAM HCL 2 MG/2ML IJ SOLN
INTRAMUSCULAR | Status: AC
  Administered 2019-11-17: 2 mg
  Filled 2019-11-17: qty 2

## 2019-11-17 MED ORDER — ORAL CARE MOUTH RINSE: 15.0000 mL | OROMUCOSAL | Status: DC

## 2019-11-17 MED ORDER — PROPOFOL 1000 MG/100ML IV EMUL
5.0000 ug/kg/min | INTRAVENOUS | Status: DC
  Administered 2019-11-17 – 2019-11-18 (×3): 30 ug/kg/min via INTRAVENOUS
  Filled 2019-11-17 (×2): qty 100

## 2019-11-17 MED ORDER — PROPOFOL 1000 MG/100ML IV EMUL
INTRAVENOUS | Status: AC
  Filled 2019-11-17: qty 100

## 2019-11-17 MED ORDER — VANCOMYCIN HCL IN DEXTROSE 1-5 GM/200ML-% IV SOLN: 1000.0000 mg | INTRAVENOUS | Status: DC

## 2019-11-17 NOTE — Progress Notes (Signed)
PT Cancellation Note  Patient Details Name: [name] MRN: [id number] DOB: [birth year]   Cancelled Treatment:    Reason Eval/Treat Not Completed: Patient not medically ready;Medical issues which prohibited therapy Spoke with RN Vikki Ports (orienting with Suzan Garibaldi.) who stated patient was transferring to progressive care due to worsening condition, will check back another day.  Ann Held PT, DPT Acute Rehab [place] P: [place] 11/17/2019, 2:38 PM

## 2019-11-17 NOTE — Progress Notes (Signed)
Assisted MD in intubation and bedside bronchoscopy. No complications were noted.

## 2019-11-17 NOTE — CV Procedure (Addendum)
2D echo attempted but patient is in route to [place]

## 2019-11-17 NOTE — Consult Note (Signed)
NAME:  [name], MRN:  [id number], DOB:  1945/11/26, LOS: 6 ADMISSION DATE:  11/10/2019, CONSULTATION DATE:  11/17/2019 REFERRING MD:  Dr. Horris Latino, CHIEF COMPLAINT:  Respiratory distress   Brief History   74yo female admitted with sepsis secondary to left pyelonephritis 3/1, PCCM consulted 3/8 due to increasing oxygen demand and AMS.   History of present illness   [name] is a 74yo female with a PMH significant for prior CVA, PAD, meningioma, hypothyroidism, hypertension, GERD, depression, bipolar disease, and anemia who presented to the emergency department with complaints of dysuria, urinary frequency, suprapubic abdominal pain, left-sided flank pain, and chills that began 1 week prior to admission.  Patient has reported low-grade fever and nausea.  Patient stated primary care physician referred to the emergency department for evaluation.  On arrival patient was seen with vital signs within normal limits.  Lab work significant for WBC of 17.1, NA 123, creatinine 2.5, and elevated LFTs.  UA with large leukocyte esterase and large amount of RBCs.  Chest x-ray with questionable interstitial lung disease and no acute cardiopulmonary findings.  Morning of 3/8 patient is seen with increasing O2 demands with escalation from room air to high flow nasal cannula to maintain sats greater than 92% with associated altered mental status.  Chest CT revealed patchy groundglass opacities and mild cardiomegaly concerning for flash pulmonary edema.  PCCM consulted for further assistance in management.   Past Medical History  Stroke PAD Meningioma hypothyroidism Hypertension GERD Gastritis Depression CAD Chronic constipation Bipolar disease Arthritis Anemia AAA 3.1 cm 06/2018  Significant Hospital Events   Admitted 3/1 3/8 transfer to progressive care due to increasing respiratory treatment status  Consults:    Procedures:    Significant Diagnostic Tests:  CT ABD.Pelvis 3/2 > 1. No  acute findings are noted in the abdomen or pelvis. Specifically, no unexpected perinephric fluid collections or other signs of complicated pyelonephritis. 2. Pulmonary fibrosis in the visualize lung bases, similar to prior studies. 3. Aortic atherosclerosis, as well as multi-vessel coronary artery disease and ectasia of the infrarenal abdominal aorta (mean diameter of 2.8 cm). 4. Additional incidental findings, as above.  CXR 3/5 > No acute disease in a patient with emphysema and chronic interstitial lung disease  Chest CTA 3/8 > 1. New severe patchy ground-glass opacity, consolidation and septal thickening throughout both lungs, most prominent in the upper lobes. Given the mild cardiomegaly and small dependent left pleural effusion, findings could represent flash pulmonary edema, although the differential includes multilobar pneumonia or diffuse alveolar hemorrhage. 2. Nonspecific mild mediastinal lymphadenopathy, increased since 09/26/2019 chest CT, possibly reactive. Suggest attention on follow-up chest CT with IV contrast in 3 months. 3. Two-vessel coronary atherosclerosis. 4. Aortic Atherosclerosis (ICD10-I70.0) and Emphysema (ICD10-J43.9).  Micro Data:  COVID 3/2 > negative  Blood culture 3/1 > Enterobacter and E.coli  Blood culture 3/7 >   Urine culture 3/8 >   Antimicrobials:  Ceftriaxone 3/2 > 3/8 Cefepime 3/8 >  Vancomycin 3/8 >   Interim history/subjective:  Lying in bed altered with intermittent statements of clarity.   Objective   Blood pressure 116/68, pulse 89, temperature 99.2 F (37.3 C), temperature source Rectal, resp. rate (!) 34, height 5\' 8"  (1.727 m), weight 71.7 kg, SpO2 96 %.        Intake/Output Summary (Last 24 hours) at 11/17/2019 1437 Last data filed at 11/17/2019 1346 Gross per 24 hour  Intake 540 ml  Output 700 ml  Net -160 ml   [employer]  11/11/19 0010  Weight: 71.7 kg    Examination: General: Chronically ill appearing  elderly female in mild distress, in NAD HEENT: [place]/AT, MM pink/dry, PERRL,  Neuro: Alert with underlying confusion, able to follow simple commands  CV: s1s2 regular rate and rhythm, no murmur, rubs, or gallops,  PULM:  Rhonchi bilaterally, mild tachypnea  GI: soft, bowel sounds active in all 4 quadrants, non-tender, non-distended Extremities: warm/dry, no edema  Skin: no rashes or lesions   Resolved Hospital Problem list     Assessment & Plan:  Sepsis likely due to E.coli UTI E.Coli Bacteremia -Patient presented with complaints of dysuria, urinary frequency, suprapubic abdominal pain, left-sided flank pain, chills, low-grade fever and nausea. Urinalysis on admission with large leukocytosis, many bacteria, but negative nitrates. 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 Baseline creatinine 0.8-0.9, creatinine on admission 2.18 -CT ABD/Pelvs with no acute findings P: Follow renal function / urine output Trend Bmet Avoid nephrotoxins, ensure adequate renal perfusion  IV hydration Obtain renal lytes  ECHO pending   History of  Diastolic congestive heart failure  P: Continue scheduled antihypertensives  Obtain ECHO Trend Bmet  Continue Heparin drip  Strict intake and output  Close monitoring in the ICU setting   Acute metabolic encephalopathy  -In the setting of acute bacteremia  P: Delirium precautions  Minimize sedation  Treatment for underlying cause   History of PVD with possible atheroemboli  -Home medications include xarelto  P: Hold IV heparin drip due to possible Pulmonary renal syndrome   Current daily smoker  Questionable COPD vs ILD  -Current 1PPD smoker  -CT chest with questionable ILD P: Duonebs  Supplemental oxygen  Supportive care   Current liquid diarrhea  History of chronic constipation  P: Hold Linzess Frequent peri care    Best practice:  Diet: NPO Pain/Anxiety/Delirium protocol (if indicated): PRNs VAP protocol (if indicated): N/A DVT prophylaxis: SCD, heparin drip on hold GI prophylaxis: PPI Glucose control: SSI Mobility: bedrest  Code Status: Full Family Communication: Updated at bedside  Disposition: ICU  Labs   CBC: Recent Labs  Lab 11/13/19 0934 11/14/19 0708 11/15/19 0750 11/16/19 0246 11/17/19 0159  WBC 11.9* 11.4* 13.4* 15.6* 21.3*  NEUTROABS 10.2* 9.4* 10.0* 12.1* 17.5*  HGB 12.3 12.2 11.9* 11.2* 10.7*  HCT 37.1 36.2 34.4* 32.2* 30.6*  MCV 88.8 85.8 84.1 83.9 83.2  PLT 125* 105* 105* 115* [id number]    Basic Metabolic Panel: Recent Labs  Lab 11/13/19 0934 11/14/19 0708 11/15/19 0750 11/16/19 0246 11/17/19 0159  NA 129* 130* 132* 132* 131*  K 3.6 3.5 3.3* 3.0* 3.4*  CL 97* 101 103 103 103  CO2 16* 15* 16*  16* 17*  GLUCOSE 108* 110* 121* 126* 137*  BUN 62* 61* 53* 48* 44*  CREATININE 3.99* 3.61* 3.23* 2.96* 2.61*  CALCIUM 8.3* 8.3* 8.2* 8.0* 7.6*   GFR: Estimated Creatinine Clearance: 18.5 mL/min (A) (by C-G formula based on SCr of 2.61 mg/dL (H)). Recent Labs  Lab 11/10/19 1626 11/12/19 0131 11/14/19 0708 11/15/19 0750  11/16/19 0246 11/17/19 0159  WBC 17.1*   < > 11.4* 13.4* 15.6* 21.3*  LATICACIDVEN 1.7  --   --   --   --   --    < > = values in this interval not displayed.    Liver Function Tests: Recent Labs  Lab 11/10/19 1514 11/10/19 1626 11/12/19 0131  AST 48* 51* 38  ALT 132* 137* 84*  ALKPHOS 160* 136* 169*  BILITOT 0.5 1.1 0.7  PROT 6.5 7.4 6.0*  ALBUMIN 3.7 3.4* 2.4*   No results for input(s): LIPASE, AMYLASE in the last 168 hours. No results for input(s): AMMONIA in the last 168 hours.  ABG    Component Value Date/Time   PHART 7.283 (L) 11/17/2019 1328   PCO2ART 40.1 11/17/2019 1328   PO2ART 89.1 11/17/2019 1328   HCO3 18.4 (L) 11/17/2019 1328   TCO2 30 09/13/2018 0640   ACIDBASEDEF 7.1 (H) 11/17/2019 1328   O2SAT 96.1 11/17/2019 1328     Coagulation Profile: Recent Labs  Lab 11/10/19 1626  INR 1.5*    Cardiac Enzymes: No results for input(s): CKTOTAL, CKMB, CKMBINDEX, TROPONINI in the last 168 hours.  HbA1C: Hemoglobin A1C  Date/Time Value Ref Range Status  12/25/2012 02:29 PM 5.6  Final   Hgb A1c MFr Bld  Date/Time Value Ref Range Status  10/24/2017 08:46 AM 5.8 (H) 4.8 - 5.6 % Final    Comment:    (NOTE) Pre diabetes:          5.7%-6.4% Diabetes:              >6.4% Glycemic control for   <7.0% adults with diabetes     CBG: No results for input(s): GLUCAP in the last 168 hours.  Review of Systems:   Unable to assess due to confusion   Past Medical History  She,  has a past medical history of AAA (abdominal aortic aneurysm) ([place]), Anemia, Appendicitis with abscess, Arthritis, Bipolar disorder ([place]), Cerebrovascular disease, Colonic inertia, Constipation, Coronary artery disease, Depression, Duodenitis, EKG abnormalities, Gastritis, GERD (gastroesophageal reflux disease), Hypertension, Hypothyroid (01/16/2014), Meningioma ([place]) (10/21/2013), PAD (peripheral artery disease) ([place]), Psoriasis, Stroke ([place]), Wears dentures, and Wears glasses.   Surgical  History    Past Surgical History:  Procedure Laterality Date  . ABDOMINAL AORTOGRAM W/LOWER EXTREMITY N/A 09/13/2018   Procedure: ABDOMINAL AORTOGRAM W/LOWER EXTREMITY;  Surgeon: Elam Dutch, MD;  Location: [facility];  Service: Cardiovascular;  Laterality: N/A;  . arthroscopy  04/2010   Right knee  . CATARACT EXTRACTION W/ INTRAOCULAR LENS  IMPLANT, BILATERAL    . COLONOSCOPY    . FEMORAL-POPLITEAL BYPASS GRAFT  10/22/2018  . FEMORAL-POPLITEAL BYPASS GRAFT Left 10/22/2018   Procedure: LEFT FEMORAL TO BELOW THE KNEE POPLITEAL ARTERY BYPASS GRAFT;  Surgeon: Elam Dutch, MD;  Location: [facility];  Service: Vascular;  Laterality: Left;  . I & D EXTREMITY Left 11/08/2018   Procedure: IRRIGATION AND DEBRIDEMENT EXTREMITY Left Leg;  Surgeon: Elam Dutch, MD;  Location: [facility];  Service: Vascular;  Laterality: Left;  . IR RADIOLOGIST EVAL & MGMT  07/30/2018  . MULTIPLE TOOTH EXTRACTIONS    .  TUBAL LIGATION       Social History   reports that she has been smoking cigarettes. She has a 45.00 pack-year smoking history. She has never used smokeless tobacco. She reports current alcohol use. She reports that she does not use drugs.   Family History   Her family history includes Alcohol abuse in her brother; Breast cancer in her mother; Diabetes in her son; Heart disease in her brother, maternal aunt, and another family member; Lung cancer in her brother. There is no history of Colon cancer.   Allergies No Known Allergies   Home Medications  Prior to Admission medications   Medication Sig Start Date End Date Taking? Authorizing Provider  amLODipine (NORVASC) 10 MG tablet Take 1 tablet (10 mg total) by mouth daily. 03/07/19  Yes [name], MD  DULoxetine (CYMBALTA) 30 MG capsule Take 1 capsule (30 mg total) by mouth every evening. 04/15/19  Yes Cottle, Billey Co., MD  famotidine (PEPCID) 20 MG tablet One at bedtime Patient taking differently: Take 20 mg by mouth at bedtime.  One at bedtime 02/14/16  Yes Tanda Rockers, MD  lamoTRIgine (LAMICTAL) 150 MG tablet TAKE 1 TABLET(150 MG) BY MOUTH TWICE DAILY Patient taking differently: Take 75 mg by mouth daily.  05/23/19  Yes Cottle, Billey Co., MD  linaclotide [facility]) 145 MCG CAPS capsule Take 1 capsule (145 mcg total) by mouth daily before breakfast. 05/14/19  Yes Willia Craze, NP  losartan (COZAAR) 50 MG tablet Take 1 tablet (50 mg total) by mouth daily. 03/07/19  Yes [name], MD  Melatonin 10 MG TABS Take 20 mg by mouth at bedtime.   Yes [provider]  polyethylene glycol powder (GLYCOLAX/MIRALAX) powder Take 17 g by mouth daily as needed for moderate constipation.    Yes [provider]  rOPINIRole (REQUIP) 2 MG tablet TAKE 1 AND 1/2 TABLETS(3 MG) BY MOUTH AT BEDTIME Patient taking differently: Take 3 mg by mouth at bedtime.  10/31/19  Yes Cottle, Billey Co., MD  clonazePAM (KLONOPIN) 0.5 MG tablet TAKE 1 TABLET BY MOUTH AT BEDTIME AS NEEDED FOR ANXIETY Patient taking differently: Take 0.5 mg by mouth 2 (two) times daily as needed for anxiety. TAKE 1 TABLET BY MOUTH AT BEDTIME FOR ANXIETY 10/22/19   Cottle, Billey Co., MD  rivaroxaban (XARELTO) 20 MG TABS tablet Take 1 tablet (20 mg total) by mouth daily with supper. 10/02/19   Elam Dutch, MD     Critical care time:    Performed by: Johnsie Cancel  Total critical care time: 45 minutes  Critical care time was exclusive of separately billable procedures and treating other patients.  Critical care was necessary to treat or prevent imminent or life-threatening deterioration.  Critical care was time spent personally by me on the following activities: development of treatment plan with patient and/or surrogate as well as nursing, discussions with consultants, evaluation of patient's response to treatment, examination of patient, obtaining history from patient or surrogate, ordering and performing treatments and interventions,  ordering and review of laboratory studies, ordering and review of radiographic studies, pulse oximetry and re-evaluation of patient's condition.  Johnsie Cancel, NP-C [facility] Contact / Pager information can be found on Amion  11/17/2019, 4:30 PM

## 2019-11-17 NOTE — Progress Notes (Signed)
0800- No telemetry boxes available on unit. Will apply once become available.

## 2019-11-17 NOTE — Progress Notes (Addendum)
PROGRESS NOTE    [name]  [id number] DOB: 1946/05/02 DOA: 11/10/2019 PCP: [name], MD    Brief Narrative:  74 year old female who presented with urinary tract infection.  She does have significant past medical history for abdominal aortic aneurysm, history of CVA, coronary artery disease, hypertension, gastritis, bipolar and depression. Patient reported 1 week of dysuria, increased urinary frequency, suprapubic abdominal pain, left-sided flank pain and chills. Symptoms associated with low-grade fever and nausea.  Patient was seen by her primary care provider who referred her to emergency room for evaluation. CT abdomen and pelvis with no acute findings no obstructive uropathy.  Chest radiograph with hyperinflation, bibasilar atelectasis and increased lung markings bilaterally. Patient has been admitted to the medical ward, started on supportive fluids, and IV antibiotic therapy. Admission blood cultures positive for E. Coli.     Assessment & Plan:   Principal Problem:   Complicated UTI (urinary tract infection) Active Problems:   HTN (hypertension)   AKI (acute kidney injury) (HCC)   Hyponatremia   Elevated LFTs   Sepsis ([place])  Acute hypoxic respiratory failure likely 2/2 HCAP vs acute CHF vs COPD exacerbation with a history of interstitial lung disease/current smoker/DAH Currently requiring non-rebreather, noted to be gradually desaturating since 11/16/2019 Currently afebrile, with worsening leukocytosis BNP pending ABG showed pH of 7.283, bicarb 18, CO2 40, O2 89 CXR with emphysema, ILD changes, nothing acute CT chest without contrast showed new severe patchy groundglass opacity, consolidation throughout both lungs, could represent flash pulmonary edema, versus multi lobar pneumonia or diffuse alveolar hemorrhage Echo pending Switched to IV cefepime, added vancomycin Give 1 dose of IV Lasix Duonebs, started inhalers PCCM consulted, transfer to ICU Monitor  closely  Sepsis due to left pyelonephritis, end organ failure AKI, present on admission E coli bacteremia Currently afebrile, with leukocytosis BC x2 growing pan-sensitive E. Coli Repeat BC X 2, NGTD so far UA with large leukocytes, UC pending CT with no obstructive uropathy Switch to IV cefepime D/C IV fluids Monitor closely  Acute metabolic encephalopathy Likely 2/2 above Management as above  AKI with hyponatremia and non gap metabolic acidosis Slowly improving D/C IV fluids Daily BMP  Hypokalemia Replace prn  Normocytic anemia Hemoglobin noted to be dropping No obvious signs of bleeding Patient on IV Heparin May need to hold IV heparin due to possible ?Diffuse alveolar hemorrhage  HTN Restarted amlodipine, continue to hold home losartan  History of peripheral vascular disease with possible atheroemboli Patient was on Xarelto 20 mg daily Due to AKI currently, unable to take Xarelto, discussed with Dr. Oneida Alar on 11/12/2019 okay to switch to IV Heparin for now pending improvement of renal function Continue IV Heparin  GERD Continue famotidine  Chronic constipation Continue Linzess  Bipolar, depression and restless leg syndrome Continue lamotrigine, ropinirole, and clonazepam  Tobacco abuse Advised to quit      DVT prophylaxis: Heparin drip Code Status:  full Family Communication: Spoke to son [name] on 11/17/19, updated him about patient's recent deterioration Disposition Plan/ discharge barriers: Patient from home, barriers for discharge, sepsis requiring IV antibiotic therapy, respiratory failure requiring supplemental oxygen, PT/OT pending evaluation   Antimicrobials:   Cefepime  Vancomycin   Subjective: Patient seen and examined at bedside, noted to appear more lethargic although oriented to time place and person, noted to be requiring more oxygen overnight into this morning.  No documentations of any intervention done.  Patient denies any chest  pain, abdominal pain, nausea/vomiting, fever/chills.  Has been reporting poor  appetite since admission.    Objective: Vitals:   11/17/19 1304 11/17/19 1305 11/17/19 1328 11/17/19 1354  BP:   116/68   Pulse:   89   Resp:   (!) 34 (!) 36  Temp:   99.2 F (37.3 C)   TempSrc:   Rectal   SpO2: 90% 99% (!) 84% 96%  Weight:      Height:        Intake/Output Summary (Last 24 hours) at 11/17/2019 1553 Last data filed at 11/17/2019 1346 Gross per 24 hour  Intake 360 ml  Output 700 ml  Net -340 ml   Filed Weights   11/11/19 0010  Weight: 71.7 kg    Examination:   General: NAD, deconditioned, lethargic, dry oral mucosa  Cardiovascular: S1, S2 present  Respiratory: Diminished BS b/l, inspiratory crackles noted throughout lung field  Abdomen: Soft, nontender, nondistended, bowel sounds present  Musculoskeletal: No bilateral pedal edema noted  Skin: Normal  Psychiatry: Normal mood     Data Reviewed: I have personally reviewed following labs and imaging studies  CBC: Recent Labs  Lab 11/13/19 0934 11/14/19 0708 11/15/19 0750 11/16/19 0246 11/17/19 0159  WBC 11.9* 11.4* 13.4* 15.6* 21.3*  NEUTROABS 10.2* 9.4* 10.0* 12.1* 17.5*  HGB 12.3 12.2 11.9* 11.2* 10.7*  HCT 37.1 36.2 34.4* 32.2* 30.6*  MCV 88.8 85.8 84.1 83.9 83.2  PLT 125* 105* 105* 115* [id number]   Basic Metabolic Panel: Recent Labs  Lab 11/13/19 0934 11/14/19 0708 11/15/19 0750 11/16/19 0246 11/17/19 0159  NA 129* 130* 132* 132* 131*  K 3.6 3.5 3.3* 3.0* 3.4*  CL 97* 101 103 103 103  CO2 16* 15* 16* 16* 17*  GLUCOSE 108* 110* 121* 126* 137*  BUN 62* 61* 53* 48* 44*  CREATININE 3.99* 3.61* 3.23* 2.96* 2.61*  CALCIUM 8.3* 8.3* 8.2* 8.0* 7.6*   GFR: Estimated Creatinine Clearance: 18.5 mL/min (A) (by C-G formula based on SCr of 2.61 mg/dL (H)). Liver Function Tests: Recent Labs  Lab 11/10/19 1626 11/12/19 0131  AST 51* 38  ALT 137* 84*  ALKPHOS 136* 169*  BILITOT 1.1 0.7  PROT 7.4 6.0*    ALBUMIN 3.4* 2.4*   No results for input(s): LIPASE, AMYLASE in the last 168 hours. No results for input(s): AMMONIA in the last 168 hours. Coagulation Profile: Recent Labs  Lab 11/10/19 1626  INR 1.5*   Cardiac Enzymes: No results for input(s): CKTOTAL, CKMB, CKMBINDEX, TROPONINI in the last 168 hours. BNP (last 3 results) Recent Labs    03/10/19 1438  PROBNP 263   HbA1C: No results for input(s): HGBA1C in the last 72 hours. CBG: No results for input(s): GLUCAP in the last 168 hours. Lipid Profile: No results for input(s): CHOL, HDL, LDLCALC, TRIG, CHOLHDL, LDLDIRECT in the last 72 hours. Thyroid Function Tests: No results for input(s): TSH, T4TOTAL, FREET4, T3FREE, THYROIDAB in the last 72 hours. Anemia Panel: No results for input(s): VITAMINB12, FOLATE, FERRITIN, TIBC, IRON, RETICCTPCT in the last 72 hours.    Radiology Studies: I have reviewed all of the imaging during this hospital visit personally     Scheduled Meds: . amLODipine  10 mg Oral Daily  . Chlorhexidine Gluconate Cloth  6 each Topical Daily  . DULoxetine  30 mg Oral QPM  . famotidine  20 mg Oral QHS  . furosemide      . ipratropium-albuterol  3 mL Nebulization Q6H  . lamoTRIgine  75 mg Oral Daily  . linaclotide  145 mcg Oral QAC breakfast  .  Melatonin  18 mg Oral QHS  . methylPREDNISolone (SOLU-MEDROL) injection  60 mg Intravenous Q6H  . mometasone-formoterol  2 puff Inhalation BID  . rOPINIRole  3 mg Oral QHS  . sodium bicarbonate  650 mg Oral BID   Continuous Infusions: . ceFEPime (MAXIPIME) IV    . heparin 2,100 Units/hr (11/17/19 0425)  . [START ON 11/19/2019] vancomycin    . vancomycin       LOS: 6 days        Alma Friendly, MD

## 2019-11-17 NOTE — Progress Notes (Signed)
eLink Physician-Brief Progress Note Patient Name: [name] DOB: [birth year] MRN: [id number]   Date of Service  11/17/2019  HPI/Events of Note  Last pH = 7.164 - Ventilator rate increased since that ABG. No electrolytes since 2 AM.   eICU Interventions  Will order: 1. ABG now. 2. BMP and Mg++ now.      Intervention Category Major Interventions: Respiratory failure - evaluation and management;Acid-Base disturbance - evaluation and management  [name] 11/17/2019, 8:43 PM

## 2019-11-17 NOTE — CV Procedure (Signed)
2D echo attempted for second time. RN request we try echo later due to  procedure.

## 2019-11-17 NOTE — Progress Notes (Signed)
ANTICOAGULATION CONSULT NOTE - Follow Up Consult  Pharmacy Consult for heparin Indication: PAD  Labs: Recent Labs    11/14/19 0708 11/14/19 0947 11/15/19 0750 11/15/19 0750 11/16/19 0246 11/16/19 0623 11/16/19 0808 11/16/19 1213 11/17/19 0159  HGB   < >  --  11.9*   < > 11.2*  --   --   --  10.7*  HCT   < >  --  34.4*  --  32.2*  --   --   --  30.6*  PLT   < >  --  105*  --  115*  --   --   --  157  APTT  --  57*  --   --   --   --   --   --   --   HEPARINUNFRC   < > 0.25* 0.44   < > 0.25*  --   --  0.49 0.26*  CREATININE   < >  --  3.23*  --  2.96*  --   --   --  2.61*  TROPONINIHS  --   --   --   --   --  25* 23*  --   --    < > = values in this interval not displayed.    Assessment: 74yo female now subtherapeutic on heparin after one level at goal; no gtt issues or signs of bleeding per RN.  Goal of Therapy:  Heparin level 0.3-0.7 units/ml   Plan:  Will increase heparin gtt slightly to 2100 units/hr and check level in 8 hours.    Wynona Neat, PharmD, BCPS  11/17/2019,3:19 AM

## 2019-11-17 NOTE — Progress Notes (Signed)
Patient O2 saturation 77% on 6L [place]. Rapid response at the bedside. [place], RN paged Dr. Horris Latino. Order placed to transfer patient to progressive care.   Patient was bladder scanned- retaining 765mL. Order received to in and out patient. Patient was able to urinate 727mL without in and out.   Patient's son at the bedside.   In route to 2W, CCM this RN received a call to transfer patient straight to 4N ICU.   Patient transferred to 4N ICU. Report given to TK at bedside.

## 2019-11-17 NOTE — Procedures (Signed)
Bronchoscopy Procedure Note BETHEL SIROIS [id number] 1946/02/06  Procedure: Bronchoscopy Indications: Diagnostic evaluation of the airways  Procedure Details Consent: Risks of procedure as well as the alternatives and risks of each were explained to the (patient/caregiver).  Consent for procedure obtained. Time Out: Verified patient identification, verified procedure, site/side was marked, verified correct patient position, special equipment/implants available, medications/allergies/relevent history reviewed, required imaging and test results available.  Performed  In preparation for procedure, patient was given 100% FiO2 and bronchoscope lubricated. Sedation: Benzodiazepines, Muscle relaxants and Etomidate  Airway entered and the following bronchi were examined: RUL, RML, RLL, LUL, LLL and Bronchi.   Procedures performed: BAL, 3 serial aliquots, no blood return no evidence of hemmorrhage Bronchoscope removed.    Evaluation Hemodynamic Status: BP stable throughout; O2 sats: stable throughout Patient's Current Condition: stable Specimens:  Sent clear BAL fluid from RUL  Complications: No apparent complications Patient did tolerate procedure well.   Octavio Graves Jeanna Giuffre 11/17/2019

## 2019-11-17 NOTE — Progress Notes (Signed)
ANTICOAGULATION CONSULT NOTE - Follow Up Consult  Pharmacy Consult for Heparin + Vanco/Cefepime Indication: PAD + HCAP  No Known Allergies  Patient Measurements: Height: 5\' 8"  (172.7 cm) Weight: 158 lb 1.1 oz (71.7 kg) IBW/kg (Calculated) : 63.9  Vital Signs: Temp: 98.8 F (37.1 C) (03/08 0519) Temp Source: Oral (03/08 0519) BP: 133/60 (03/08 1023) Pulse Rate: 99 (03/08 0519)  Labs: Recent Labs    11/15/19 0750 11/15/19 0750 11/16/19 0246 11/16/19 0246 11/16/19 0623 11/16/19 0808 11/16/19 1213 11/17/19 0159 11/17/19 1117  HGB 11.9*   < > 11.2*  --   --   --   --  10.7*  --   HCT 34.4*  --  32.2*  --   --   --   --  30.6*  --   PLT 105*  --  115*  --   --   --   --  157  --   HEPARINUNFRC 0.44   < > 0.25*   < >  --   --  0.49 0.26* 0.52  CREATININE 3.23*  --  2.96*  --   --   --   --  2.61*  --   TROPONINIHS  --   --   --   --  25* 23*  --   --   --    < > = values in this interval not displayed.    Estimated Creatinine Clearance: 18.5 mL/min (A) (by C-G formula based on SCr of 2.61 mg/dL (H)).   Assessment:   Anticoag: On Xarelto PTA, last dose 2/28, held on admission for hematuria, now switching to heparin for AKI. HL 0.52 in goal. Hgb down to 10.7. Plts up to 157.  ID: Ecoli Bacteremia / Pyelo. Broaden abx coverage for HCAP Tmax 99.1. WBC up to 21.3. Scr 2.61 down  Ceftriaxone 3/2> 3/8 Vanco 3/8>> Cefepime 3/8>>  3/1 BC x 2: E Coli sens pending 3/7: BC x 2>>  Goal of Therapy:  Heparin level 0.3-0.7 units/ml Monitor platelets by anticoagulation protocol: Yes  Vanco troughs 15-20   Plan:  Cont hep gtt 2100 units/hr Daily HL and CBC Start Cefepime 2g IV q24h Vancomycin 1250mg  IV x 1, then 1g IV q48hrs (traditional dosing until renal function stabilizes)   Iram Astorino S. Alford Highland, PharmD, BCPS Clinical Staff Pharmacist [email or web address] Alford Highland, Rovena Hearld Stillinger 11/17/2019,12:54 PM

## 2019-11-17 NOTE — Plan of Care (Signed)

## 2019-11-17 NOTE — Progress Notes (Addendum)
OT Cancellation Note  Patient Details Name: [name] MRN: [id number] DOB: November 14, 1945   Cancelled Treatment:    Reason Eval/Treat Not Completed: Fatigue/lethargy limiting ability to participate. Pt pleasant upon OT entry however noted very lethargic, will engage with therapist but unable to maintain eyes open for more than a brief period, falling asleep even while attempting to state her name/DOB, RN aware. Will follow up for OT eval as schedule permits and once pt able to participate.    Checked in this PM, RN reports increased O2 needs/worsening SOB, requests to hold at this time. Will continue to follow.   Lou Cal, OT Acute Rehabilitation Services Pager [phone] Office [phone]   Raymondo Band 11/17/2019, 9:48 AM

## 2019-11-17 NOTE — Procedures (Signed)
Intubation Procedure Note [name] [id number] 17-Mar-1946  Procedure: Intubation Indications: Respiratory insufficiency, plans for bronchoscopy   Procedure Details Consent: Risks of procedure as well as the alternatives and risks of each were explained to the (patient/caregiver).  Consent for procedure obtained. Time Out: Verified patient identification, verified procedure, site/side was marked, verified correct patient position, special equipment/implants available, medications/allergies/relevent history reviewed, required imaging and test results available.  Performed  Maximum sterile technique was used including cap, gloves, gown, hand hygiene and mask.  MAC videoscope #3  Sedation with versed, fent, etomidate and rocuronium  Grade 1 view 7.5 ETT in place  Evaluation Hemodynamic Status: BP stable throughout; O2 sats: stable throughout Patient's Current Condition: stable Complications: No apparent complications Patient did tolerate procedure well. Chest X-ray ordered to verify placement.  CXR: pending.   Octavio Graves Donna Silverman 11/17/2019

## 2019-11-17 NOTE — Progress Notes (Signed)
31- Dr. Horris Latino paged   1317-Dr. Horris Latino returned paged and informed of patient change in status. Patient self removed supplemental oxygen and SpO2 77%. Rapid response on unit and attended bedside. Patient placed on non-rebreather mask and SpO2 currently 99%. Crackle lung sounds audible and decreased urine output. Bladder scan >721ml. VS 131/61 and HR 89. No telemetry box available at this time. MD gave telephone order to do in&out cath and transfer patient to 2W.

## 2019-11-18 ENCOUNTER — Inpatient Hospital Stay (HOSPITAL_COMMUNITY): Payer: Medicare Other

## 2019-11-18 DIAGNOSIS — N179 Acute kidney failure, unspecified: Secondary | ICD-10-CM

## 2019-11-18 DIAGNOSIS — R079 Chest pain, unspecified: Secondary | ICD-10-CM

## 2019-11-18 LAB — CYTOLOGY - NON PAP

## 2019-11-18 LAB — BASIC METABOLIC PANEL
Anion gap: 14 (ref 5–15)
BUN: 50 mg/dL — ABNORMAL HIGH (ref 8–23)
CO2: 17 mmol/L — ABNORMAL LOW (ref 22–32)
Calcium: 8 mg/dL — ABNORMAL LOW (ref 8.9–10.3)
Chloride: 104 mmol/L (ref 98–111)
Creatinine, Ser: 3.03 mg/dL — ABNORMAL HIGH (ref 0.44–1.00)
GFR calc Af Amer: 17 mL/min — ABNORMAL LOW (ref >60)
GFR calc non Af Amer: 14 mL/min — ABNORMAL LOW (ref >60)
Glucose, Bld: 196 mg/dL — ABNORMAL HIGH (ref 70–99)
Potassium: 3.6 mmol/L (ref 3.5–5.1)
Sodium: 135 mmol/L (ref 135–145)

## 2019-11-18 LAB — CBC WITH DIFFERENTIAL/PLATELET
Abs Immature Granulocytes: 0.2 10*3/uL — ABNORMAL HIGH (ref 0.00–0.07)
Basophils Absolute: 0 10*3/uL (ref 0.0–0.1)
Basophils Relative: 0 %
Eosinophils Absolute: 0 10*3/uL (ref 0.0–0.5)
Eosinophils Relative: 0 %
HCT: 27.5 % — ABNORMAL LOW (ref 36.0–46.0)
Hemoglobin: 9.4 g/dL — ABNORMAL LOW (ref 12.0–15.0)
Immature Granulocytes: 1 %
Lymphocytes Relative: 4 %
Lymphs Abs: 0.6 10*3/uL — ABNORMAL LOW (ref 0.7–4.0)
MCH: 28.8 pg (ref 26.0–34.0)
MCHC: 34.2 g/dL (ref 30.0–36.0)
MCV: 84.4 fL (ref 80.0–100.0)
Monocytes Absolute: 0.4 10*3/uL (ref 0.1–1.0)
Monocytes Relative: 3 %
Neutro Abs: 14.3 10*3/uL — ABNORMAL HIGH (ref 1.7–7.7)
Neutrophils Relative %: 92 %
Platelets: 188 10*3/uL (ref 150–400)
RBC: 3.26 MIL/uL — ABNORMAL LOW (ref 3.87–5.11)
RDW: 14.8 % (ref 11.5–15.5)
WBC: 15.5 10*3/uL — ABNORMAL HIGH (ref 4.0–10.5)
nRBC: 0 % (ref 0.0–0.2)

## 2019-11-18 LAB — URINE CULTURE

## 2019-11-18 LAB — ACID FAST SMEAR (AFB, MYCOBACTERIA): Acid Fast Smear: NEGATIVE

## 2019-11-18 LAB — URINALYSIS, ROUTINE W REFLEX MICROSCOPIC
Bilirubin Urine: NEGATIVE
Glucose, UA: NEGATIVE mg/dL
Ketones, ur: NEGATIVE mg/dL
Nitrite: NEGATIVE
Protein, ur: 30 mg/dL — AB
Specific Gravity, Urine: 1.015 (ref 1.005–1.030)
pH: 5.5 (ref 5.0–8.0)

## 2019-11-18 LAB — ECHOCARDIOGRAM COMPLETE
Height: 68 in
Weight: 2529.12 oz

## 2019-11-18 LAB — SODIUM, URINE, RANDOM: Sodium, Ur: <10 mmol/L

## 2019-11-18 LAB — HEPARIN LEVEL (UNFRACTIONATED): Heparin Unfractionated: <0.1 IU/mL — ABNORMAL LOW (ref 0.30–0.70)

## 2019-11-18 LAB — MPO/PR-3 (ANCA) ANTIBODIES
ANCA Proteinase 3: <3.5 U/mL (ref 0.0–3.5)
Myeloperoxidase Abs: <9 U/mL (ref 0.0–9.0)

## 2019-11-18 LAB — ANCA TITERS
Atypical P-ANCA titer: <1:20 {titer}
C-ANCA: <1:20 {titer}
P-ANCA: <1:20 {titer}

## 2019-11-18 LAB — CULTURE, BLOOD (ROUTINE X 2)

## 2019-11-18 LAB — URINALYSIS, MICROSCOPIC (REFLEX)

## 2019-11-18 LAB — PNEUMOCYSTIS JIROVECI SMEAR BY DFA: Pneumocystis jiroveci Ag: NEGATIVE

## 2019-11-18 LAB — RHEUMATOID FACTOR: Rheumatoid fact SerPl-aCnc: 20.5 IU/mL — ABNORMAL HIGH (ref 0.0–13.9)

## 2019-11-18 LAB — CREATININE, URINE, RANDOM: Creatinine, Urine: 80.41 mg/dL

## 2019-11-18 LAB — ANA W/REFLEX IF POSITIVE

## 2019-11-18 LAB — MRSA PCR SCREENING: MRSA by PCR: NEGATIVE

## 2019-11-18 LAB — GLOMERULAR BASEMENT MEMBRANE ANTIBODIES: GBM Ab: 7 units (ref 0–20)

## 2019-11-18 MED ORDER — VANCOMYCIN HCL IN DEXTROSE 1-5 GM/200ML-% IV SOLN: 1000.0000 mg | INTRAVENOUS | Status: DC

## 2019-11-18 MED ORDER — VANCOMYCIN HCL 1250 MG/250ML IV SOLN
1250.0000 mg | Freq: Once | INTRAVENOUS | Status: AC
  Administered 2019-11-18: 1250 mg via INTRAVENOUS
  Filled 2019-11-18: qty 250

## 2019-11-18 MED ORDER — MAGNESIUM SULFATE 2 GM/50ML IV SOLN
2.0000 g | Freq: Once | INTRAVENOUS | Status: AC
  Administered 2019-11-18: 2 g via INTRAVENOUS
  Filled 2019-11-18: qty 50

## 2019-11-18 MED ORDER — FENTANYL 2500MCG IN NS 250ML (10MCG/ML) PREMIX INFUSION: 0.0000 ug/h | INTRAVENOUS | Status: DC

## 2019-11-18 NOTE — Progress Notes (Signed)
eLink Physician-Brief Progress Note Patient Name: [name] DOB: 1946-02-28 MRN: [id number]   Date of Service  11/18/2019  HPI/Events of Note  Multiple issues: 1. Agitation and 2. K+ = 3.5, Mg++ = 1.3 and Creatinine = 2.74 (GFR = 19). Will not replace K+ given severe renal dysfunction.   eICU Interventions  Will order: 1. Fentanyl IV infusion. Titrate to RASS = 0 to -1.  2. Replace Mg++.      Intervention Category Major Interventions: Delirium, psychosis, severe agitation - evaluation and management;Electrolyte abnormality - evaluation and management  Shakelia Scrivner Eugene 11/18/2019, 3:20 AM

## 2019-11-18 NOTE — Progress Notes (Signed)
PT Cancellation Note  Patient Details Name: [name] MRN: [id number] DOB: May 20, 1946   Cancelled Treatment:    Reason Eval/Treat Not Completed: Patient not medically ready;Medical issues which prohibited therapy  Earlier today RN felt pt might get extubated, however remains intubated on propofol. Will reassess 11/19/19.   Arby Barrette, PT Pager [phone]   Rexanne Mano 11/18/2019, 3:07 PM

## 2019-11-18 NOTE — Progress Notes (Signed)
OT Cancellation Note  Patient Details Name: KYMBERLEE MACHOVEC MRN: [id number] DOB: November 30, 1945   Cancelled Treatment:    Reason Eval/Treat Not Completed: Medical issues which prohibited therapy(Intubated, RN request hold. Will return as schedule allows.)  [place], OTR/L Acute Rehab Pager: [phone] Office: [phone] 11/18/2019, 2:37 PM

## 2019-11-18 NOTE — Consult Note (Signed)
[facility] Renal Consultation Note  Requesting MD: Icard Indication for Consultation: AKI  HPI:  [name] is a 74 y.o. female with past medical history significant for hypertension, diffuse vascular disease to include coronary artery disease, AAA and history of CVA as well as a history of bipolar disorder and depression.  She presented to the hospital 1 week ago with complaints of dysuria, flank pain, fevers and chills.  Creatinine in December 2020 was 0.9.  Upon presentation on 3/1 creatinine was 2.18.  CT at the time without contrast did not show hydro-kidneys looked pretty normal.  Initially, she was felt to have a UTI, placed on antibiotics-found to have E coli in her blood, her hospital course did not seem that significant, was hemodynamically stable -kidney function worsened to a peak creatinine of 4.1 on 3/3 then started to come down-nadir of 2.61 on 3/8.  It was then when she started to become hypoxic and also developed some hypotension.  Chest x-ray looked suspicious for DAH-now status post bronchoscopy and is intubated.  Creatinine has now trended back up and is at a level of 3 today.  Is making urine, 2400 last 24 hours but it does seem to be decreasing.  Blood pressures got as low as the 80s last night.  Urine on presentation showed greater than 300 of protein and large RBCs.  UA today shows 30 of protein and small hemoglobin-0-5 RBCs-6-10 WBCs.  CCM had concern of possible pulmonary renal syndrome-last after DAH was not found on bronch.  Serologies have been sent.  So far resulted anti-GBM negative.  Rheumatoid factor 20.5 unsure of significance, CRP 26, ESR 80-ANA and ANCA pending.  Currently intubated with a soft blood pressure.  History is obtained from the chart  Creat  Date/Time Value Ref Range Status  07/28/2015 06:05 PM 0.77 0.60 - 0.93 mg/dL Final  11/19/2014 04:02 PM 0.82 0.50 - 1.10 mg/dL Final  07/31/2013 05:59 PM 0.91 0.50 - 1.10 mg/dL Final  01/15/2013  03:04 PM 0.76 0.50 - 1.10 mg/dL Final   Creatinine, Ser  Date/Time Value Ref Range Status  11/18/2019 05:15 AM 3.03 (H) 0.44 - 1.00 mg/dL Final  11/17/2019 09:45 PM 2.74 (H) 0.44 - 1.00 mg/dL Final  11/17/2019 01:59 AM 2.61 (H) 0.44 - 1.00 mg/dL Final  11/16/2019 02:46 AM 2.96 (H) 0.44 - 1.00 mg/dL Final  11/15/2019 07:50 AM 3.23 (H) 0.44 - 1.00 mg/dL Final  11/14/2019 07:08 AM 3.61 (H) 0.44 - 1.00 mg/dL Final  11/13/2019 09:34 AM 3.99 (H) 0.44 - 1.00 mg/dL Final  11/12/2019 01:31 AM 4.11 (H) 0.44 - 1.00 mg/dL Final  11/11/2019 04:44 PM 3.71 (H) 0.44 - 1.00 mg/dL Final    Comment:    DELTA CHECK NOTED  11/10/2019 04:26 PM 2.59 (H) 0.44 - 1.00 mg/dL Final  11/10/2019 03:14 PM 2.18 (H) 0.57 - 1.00 mg/dL Final  08/18/2019 05:37 PM 0.92 0.57 - 1.00 mg/dL Final  04/04/2019 03:36 PM 0.85 0.57 - 1.00 mg/dL Final  03/10/2019 02:38 PM 0.89 0.57 - 1.00 mg/dL Final  11/20/2018 06:42 PM 0.91 0.44 - 1.00 mg/dL Final  11/13/2018 04:59 PM 1.13 (H) 0.57 - 1.00 mg/dL Final  11/06/2018 08:31 PM 0.89 0.44 - 1.00 mg/dL Final  10/25/2018 05:08 AM 0.83 0.44 - 1.00 mg/dL Final  10/23/2018 12:10 AM 0.96 0.44 - 1.00 mg/dL Final  10/21/2018 02:12 PM 0.93 0.44 - 1.00 mg/dL Final  09/13/2018 06:40 AM 0.70 0.44 - 1.00 mg/dL Final  07/15/2018 05:41 AM 0.77 0.44 -  1.00 mg/dL Final  07/14/2018 05:30 AM 0.84 0.44 - 1.00 mg/dL Final  07/12/2018 01:46 AM 0.85 0.44 - 1.00 mg/dL Final  07/09/2018 07:57 AM 1.02 (H) 0.44 - 1.00 mg/dL Final  07/08/2018 01:44 PM 0.81 0.44 - 1.00 mg/dL Final  03/05/2018 05:58 PM 0.77 0.57 - 1.00 mg/dL Final  10/24/2017 08:46 AM 0.79 0.44 - 1.00 mg/dL Final  10/23/2017 08:29 PM 0.90 0.44 - 1.00 mg/dL Final  10/23/2017 04:33 PM 0.80 0.44 - 1.00 mg/dL Final  10/23/2017 04:30 PM 0.82 0.44 - 1.00 mg/dL Final  02/14/2016 10:33 AM 0.93 0.40 - 1.20 mg/dL Final  08/11/2015 11:02 AM 0.88 0.44 - 1.00 mg/dL Final  10/21/2013 04:30 AM 0.89 0.50 - 1.10 mg/dL Final  10/20/2013 11:24 AM 0.91 0.50 -  1.10 mg/dL Final  10/20/2013 05:59 AM 1.00 0.50 - 1.10 mg/dL Final  08/15/2010 02:04 PM 0.7 0.4 - 1.2 mg/dL Final  12/10/2009 10:31 AM 0.8 0.4 - 1.2 mg/dL Final     PMHx:   Past Medical History:  Diagnosis Date  . AAA (abdominal aortic aneurysm) (HCC)    3.1 cm 07/08/18, 3 year follow-up recommended; possible 3 cm AAA by aortogram 09/13/18  . Anemia    PMH  . Appendicitis with abscess    07/08/18, s/p perc drain; resolved 07/30/18 by CT  . Arthritis   . Bipolar disorder ([place])   . Cerebrovascular disease    intra and extracranial vascular dx per MRI 4/11, neurology rec strict CVRF control  . Colonic inertia   . Constipation    chronic;severe  . Coronary artery disease   . Depression   . Duodenitis   . EKG abnormalities    changes, stress test neg (false EKG changes)  . Gastritis   . GERD (gastroesophageal reflux disease)   . Hypertension   . Hypothyroid 01/16/2014  . Meningioma ([place]) 10/21/2013  . PAD (peripheral artery disease) ([place])   . Psoriasis    sees derm  . Stroke ([place])   . Wears dentures   . Wears glasses     Past Surgical History:  Procedure Laterality Date  . ABDOMINAL AORTOGRAM W/LOWER EXTREMITY N/A 09/13/2018   Procedure: ABDOMINAL AORTOGRAM W/LOWER EXTREMITY;  Surgeon: Elam Dutch, MD;  Location: [facility];  Service: Cardiovascular;  Laterality: N/A;  . arthroscopy  04/2010   Right knee  . CATARACT EXTRACTION W/ INTRAOCULAR LENS  IMPLANT, BILATERAL    . COLONOSCOPY    . FEMORAL-POPLITEAL BYPASS GRAFT  10/22/2018  . FEMORAL-POPLITEAL BYPASS GRAFT Left 10/22/2018   Procedure: LEFT FEMORAL TO BELOW THE KNEE POPLITEAL ARTERY BYPASS GRAFT;  Surgeon: Elam Dutch, MD;  Location: [facility];  Service: Vascular;  Laterality: Left;  . I & D EXTREMITY Left 11/08/2018   Procedure: IRRIGATION AND DEBRIDEMENT EXTREMITY Left Leg;  Surgeon: Elam Dutch, MD;  Location: [facility];  Service: Vascular;  Laterality: Left;  . IR RADIOLOGIST EVAL & MGMT  07/30/2018   . MULTIPLE TOOTH EXTRACTIONS    . TUBAL LIGATION      Family Hx:  Family History  Problem Relation Age of Onset  . Breast cancer Mother        metastisis to bones  . Diabetes Son   . Heart disease Other        grandfather   . Alcohol abuse Brother   . Heart disease Brother   . Heart disease Maternal Aunt   . Lung cancer Brother        smoked  .  Colon cancer Neg Hx     Social History:  reports that she has been smoking cigarettes. She has a 45.00 pack-year smoking history. She has never used smokeless tobacco. She reports current alcohol use. She reports that she does not use drugs.  Allergies: No Known Allergies  Medications: Prior to Admission medications   Medication Sig Start Date End Date Taking? Authorizing Provider  amLODipine (NORVASC) 10 MG tablet Take 1 tablet (10 mg total) by mouth daily. 03/07/19  Yes [name], MD  DULoxetine (CYMBALTA) 30 MG capsule Take 1 capsule (30 mg total) by mouth every evening. 04/15/19  Yes Cottle, Billey Co., MD  famotidine (PEPCID) 20 MG tablet One at bedtime Patient taking differently: Take 20 mg by mouth at bedtime. One at bedtime 02/14/16  Yes Tanda Rockers, MD  lamoTRIgine (LAMICTAL) 150 MG tablet TAKE 1 TABLET(150 MG) BY MOUTH TWICE DAILY Patient taking differently: Take 75 mg by mouth daily.  05/23/19  Yes Cottle, Billey Co., MD  linaclotide [facility]) 145 MCG CAPS capsule Take 1 capsule (145 mcg total) by mouth daily before breakfast. 05/14/19  Yes Willia Craze, NP  losartan (COZAAR) 50 MG tablet Take 1 tablet (50 mg total) by mouth daily. 03/07/19  Yes [name], MD  Melatonin 10 MG TABS Take 20 mg by mouth at bedtime.   Yes [provider]  polyethylene glycol powder (GLYCOLAX/MIRALAX) powder Take 17 g by mouth daily as needed for moderate constipation.    Yes [provider]  rOPINIRole (REQUIP) 2 MG tablet TAKE 1 AND 1/2 TABLETS(3 MG) BY MOUTH AT BEDTIME Patient taking differently: Take 3 mg by  mouth at bedtime.  10/31/19  Yes Cottle, Billey Co., MD  clonazePAM (KLONOPIN) 0.5 MG tablet TAKE 1 TABLET BY MOUTH AT BEDTIME AS NEEDED FOR ANXIETY Patient taking differently: Take 0.5 mg by mouth 2 (two) times daily as needed for anxiety. TAKE 1 TABLET BY MOUTH AT BEDTIME FOR ANXIETY 10/22/19   Cottle, Billey Co., MD  rivaroxaban (XARELTO) 20 MG TABS tablet Take 1 tablet (20 mg total) by mouth daily with supper. 10/02/19   Elam Dutch, MD    I have reviewed the patient's current medications.  Labs:  Results for orders placed or performed during the hospital encounter of 11/10/19 (from the past 48 hour(s))  Culture, Urine     Status: Abnormal   Collection Time: 11/16/19  1:59 PM   Specimen: Urine, Clean Catch  Result Value Ref Range   Specimen Description URINE, CLEAN CATCH    Special Requests      NONE Performed at [facility], [place] [address], [place], [place] [address]    Culture MULTIPLE SPECIES PRESENT, SUGGEST RECOLLECTION (A)    Report Status 11/18/2019 FINAL   Basic metabolic panel     Status: Abnormal   Collection Time: 11/17/19  1:59 AM  Result Value Ref Range   Sodium 131 (L) 135 - 145 mmol/L   Potassium 3.4 (L) 3.5 - 5.1 mmol/L   Chloride 103 98 - 111 mmol/L   CO2 17 (L) 22 - 32 mmol/L   Glucose, Bld 137 (H) 70 - 99 mg/dL    Comment: Glucose reference range applies only to samples taken after fasting for at least 8 hours.   BUN 44 (H) 8 - 23 mg/dL   Creatinine, Ser 2.61 (H) 0.44 - 1.00 mg/dL   Calcium 7.6 (L) 8.9 - 10.3 mg/dL   GFR calc non  Af Amer 17 (L) >60 mL/min   GFR calc Af Amer 20 (L) >60 mL/min   Anion gap 11 5 - 15    Comment: Performed at [facility] [address] [place], [place] [address]  CBC with Differential/Platelet     Status: Abnormal   Collection Time: 11/17/19  1:59 AM  Result Value Ref Range   WBC 21.3 (H) 4.0 - 10.5 K/uL   RBC 3.68 (L) 3.87 - 5.11 MIL/uL   Hemoglobin 10.7 (L) 12.0 - 15.0 g/dL   HCT 30.6 (L) 36.0 - 46.0  %   MCV 83.2 80.0 - 100.0 fL   MCH 29.1 26.0 - 34.0 pg   MCHC 35.0 30.0 - 36.0 g/dL   RDW 14.3 11.5 - 15.5 %   Platelets 157 150 - 400 K/uL   nRBC 0.0 0.0 - 0.2 %   Neutrophils Relative % 82 %   Neutro Abs 17.5 (H) 1.7 - 7.7 K/uL   Lymphocytes Relative 8 %   Lymphs Abs 1.7 0.7 - 4.0 K/uL   Monocytes Relative 6 %   Monocytes Absolute 1.2 (H) 0.1 - 1.0 K/uL   Eosinophils Relative 0 %   Eosinophils Absolute 0.1 0.0 - 0.5 K/uL   Basophils Relative 0 %   Basophils Absolute 0.1 0.0 - 0.1 K/uL   Immature Granulocytes 4 %   Abs Immature Granulocytes 0.78 (H) 0.00 - 0.07 K/uL    Comment: Performed at [facility] [address] [place], [place] [address]  Heparin level (unfractionated)     Status: Abnormal   Collection Time: 11/17/19  1:59 AM  Result Value Ref Range   Heparin Unfractionated 0.26 (L) 0.30 - 0.70 IU/mL    Comment: (NOTE) If heparin results are below expected values, and patient dosage has  been confirmed, suggest follow up testing of antithrombin III levels. Performed at [facility], [place] [address], [place],  [address]   Brain natriuretic peptide     Status: Abnormal   Collection Time: 11/17/19  1:59 AM  Result Value Ref Range   B Natriuretic Peptide 277.7 (H) 0.0 - 100.0 pg/mL    Comment: Performed at [facility] [address], [place],  [address]  Procalcitonin - Baseline     Status: None   Collection Time: 11/17/19  1:59 AM  Result Value Ref Range   Procalcitonin 2.80 ng/mL    Comment:        Interpretation: PCT > 2 ng/mL: Systemic infection (sepsis) is likely, unless other causes are known. (NOTE)       Sepsis PCT Algorithm           Lower Respiratory Tract                                      Infection PCT Algorithm    ----------------------------     ----------------------------         PCT < 0.25 ng/mL                PCT < 0.10 ng/mL         Strongly encourage             Strongly discourage   discontinuation of  antibiotics    initiation of antibiotics    ----------------------------     -----------------------------       PCT 0.25 - 0.50 ng/mL  PCT 0.10 - 0.25 ng/mL               OR       >80% decrease in PCT            Discourage initiation of                                            antibiotics      Encourage discontinuation           of antibiotics    ----------------------------     -----------------------------         PCT >= 0.50 ng/mL              PCT 0.26 - 0.50 ng/mL               AND       <80% decrease in PCT              Encourage initiation of                                             antibiotics       Encourage continuation           of antibiotics    ----------------------------     -----------------------------        PCT >= 0.50 ng/mL                  PCT > 0.50 ng/mL               AND         increase in PCT                  Strongly encourage                                      initiation of antibiotics    Strongly encourage escalation           of antibiotics                                     -----------------------------                                           PCT <= 0.25 ng/mL                                                 OR                                        > 80% decrease in PCT  Discontinue / Do not initiate                                             antibiotics Performed at [facility], [place] [address], [place], [place] [address]   Heparin level (unfractionated)     Status: None   Collection Time: 11/17/19 11:17 AM  Result Value Ref Range   Heparin Unfractionated 0.52 0.30 - 0.70 IU/mL    Comment: (NOTE) If heparin results are below expected values, and patient dosage has  been confirmed, suggest follow up testing of antithrombin III levels. Performed at [facility], [place] [address] [place], [place] [address]   Blood gas, arterial     Status: Abnormal   Collection Time: 11/17/19   1:28 PM  Result Value Ref Range   FIO2 100.00    pH, Arterial 7.283 (L) 7.350 - 7.450   pCO2 arterial 40.1 32.0 - 48.0 mmHg   pO2, Arterial 89.1 83.0 - 108.0 mmHg   Bicarbonate 18.4 (L) 20.0 - 28.0 mmol/L   Acid-base deficit 7.1 (H) 0.0 - 2.0 mmol/L   O2 Saturation 96.1 %   Patient temperature 37.0    Collection site LEFT RADIAL    Drawn by [id number]    Sample type ARTERIAL DRAW    Allens test (pass/fail) PASS PASS    Comment: Performed at [facility], [place] [address] [place], [place] [address]  Body fluid cell count with differential     Status: Abnormal   Collection Time: 11/17/19  5:57 PM  Result Value Ref Range   Fluid Type-FCT Bronch Lavag    Color, Fluid COLORLESS    Appearance, Fluid CLEAR CLEAR   Total Nucleated Cell Count, Fluid 23 0 - 1,000 cu mm   Neutrophil Count, Fluid 78 (H) 0 - 25 %   Lymphs, Fluid 5 %   Monocyte-Macrophage-Serous Fluid 16 (L) 50 - 90 %   Eos, Fluid 1 %    Comment: Performed at [facility] [address], [place], [place] [address]  Culture, respiratory     Status: None (Preliminary result)   Collection Time: 11/17/19  5:57 PM   Specimen: Bronchoalveolar Lavage; Respiratory  Result Value Ref Range   Specimen Description BRONCHIAL ALVEOLAR LAVAGE    Special Requests NONE    Gram Stain      FEW WBC PRESENT,BOTH PMN AND MONONUCLEAR NO ORGANISMS SEEN    Culture      NO GROWTH < 24 HOURS Performed at [facility], [address], [place], [place] [address]    Report Status PENDING   Pneumocystis smear by DFA     Status: None   Collection Time: 11/17/19  5:57 PM   Specimen: Bronchoalveolar Lavage; Respiratory  Result Value Ref Range   Specimen Source-PJSRC BRONCHIAL ALVEOLAR LAVAGE    Pneumocystis jiroveci Ag NEGATIVE     Comment: Performed at [facility] Performed at [facility] [address] [place], [place] [address]   I-STAT 7, (LYTES, BLD GAS, ICA, H+H)     Status: Abnormal   Collection Time:  11/17/19  6:12 PM  Result Value Ref Range   pH, Arterial 7.164 (LL) 7.350 - 7.450   pCO2 arterial 54.0 (H) 32.0 - 48.0 mmHg   pO2, Arterial 83.0 83.0 - 108.0 mmHg   Bicarbonate 19.3 (L)  20.0 - 28.0 mmol/L   TCO2 21 (L) 22 - 32 mmol/L   O2 Saturation 92.0 %   Acid-base deficit 9.0 (H) 0.0 - 2.0 mmol/L   Sodium 135 135 - 145 mmol/L   Potassium 3.1 (L) 3.5 - 5.1 mmol/L   Calcium, Ion 1.16 1.15 - 1.40 mmol/L   HCT 33.0 (L) 36.0 - 46.0 %   Hemoglobin 11.2 (L) 12.0 - 15.0 g/dL   Patient temperature 99.6 F    Collection site RADIAL, ALLEN'S TEST ACCEPTABLE    Drawn by RT    Sample type ARTERIAL    Comment NOTIFIED PHYSICIAN   Sedimentation rate     Status: Abnormal   Collection Time: 11/17/19  6:32 PM  Result Value Ref Range   Sed Rate 80 (H) 0 - 22 mm/hr    Comment: Performed at [facility], [address], [place], [place] [address]  C-reactive protein     Status: Abnormal   Collection Time: 11/17/19  6:32 PM  Result Value Ref Range   CRP 26.0 (H) <1.0 mg/dL    Comment: Performed at [facility] [address] [place], [place] [address]  Rheumatoid factor     Status: Abnormal   Collection Time: 11/17/19  6:32 PM  Result Value Ref Range   Rhuematoid fact SerPl-aCnc 20.5 (H) 0.0 - 13.9 IU/mL    Comment: (NOTE) Performed At: [facility] [place], [place] [id number] Rush Farmer MD [id number]   Glomerular basement membrane antibodies     Status: None   Collection Time: 11/17/19  6:32 PM  Result Value Ref Range   GBM Ab 7 0 - 20 units    Comment: (NOTE)                   Negative                   0 - 20                   Weak Positive             21 - 30                   Moderate to Strong Positive   >30 Performed At: [facility] [place], [place] [id number] Rush Farmer MD [id number]   I-STAT 7, (LYTES, BLD GAS, ICA, H+H)     Status: Abnormal   Collection Time: 11/17/19  8:59 PM  Result Value Ref Range    pH, Arterial 7.222 (L) 7.350 - 7.450   pCO2 arterial 47.4 32.0 - 48.0 mmHg   pO2, Arterial 211.0 (H) 83.0 - 108.0 mmHg   Bicarbonate 19.5 (L) 20.0 - 28.0 mmol/L   TCO2 21 (L) 22 - 32 mmol/L   O2 Saturation 100.0 %   Acid-base deficit 8.0 (H) 0.0 - 2.0 mmol/L   Sodium 136 135 - 145 mmol/L   Potassium 3.6 3.5 - 5.1 mmol/L   Calcium, Ion 1.17 1.15 - 1.40 mmol/L   HCT 30.0 (L) 36.0 - 46.0 %   Hemoglobin 10.2 (L) 12.0 - 15.0 g/dL   Patient temperature 98.6 F    Collection site RADIAL, ALLEN'S TEST ACCEPTABLE    Drawn by RT    Sample type ARTERIAL   Triglycerides     Status: None   Collection Time: 11/17/19  9:45 PM  Result Value Ref Range   Triglycerides 116 <150 mg/dL  Comment: Performed at [facility], [place] [address] [place], [place] [address]  Basic metabolic panel     Status: Abnormal   Collection Time: 11/17/19  9:45 PM  Result Value Ref Range   Sodium 132 (L) 135 - 145 mmol/L   Potassium 3.5 3.5 - 5.1 mmol/L   Chloride 103 98 - 111 mmol/L   CO2 16 (L) 22 - 32 mmol/L   Glucose, Bld 165 (H) 70 - 99 mg/dL    Comment: Glucose reference range applies only to samples taken after fasting for at least 8 hours.   BUN 45 (H) 8 - 23 mg/dL   Creatinine, Ser 2.74 (H) 0.44 - 1.00 mg/dL   Calcium 7.5 (L) 8.9 - 10.3 mg/dL   GFR calc non Af Amer 16 (L) >60 mL/min   GFR calc Af Amer 19 (L) >60 mL/min   Anion gap 13 5 - 15    Comment: Performed at [facility] [address] [place], [place] [address]  Magnesium     Status: Abnormal   Collection Time: 11/17/19  9:45 PM  Result Value Ref Range   Magnesium 1.3 (L) 1.7 - 2.4 mg/dL    Comment: Performed at [facility] [address] [place], [place] [address]  CBC with Differential/Platelet     Status: Abnormal   Collection Time: 11/18/19  5:15 AM  Result Value Ref Range   WBC 15.5 (H) 4.0 - 10.5 K/uL   RBC 3.26 (L) 3.87 - 5.11 MIL/uL   Hemoglobin 9.4 (L) 12.0 - 15.0 g/dL   HCT 27.5 (L) 36.0 - 46.0 %   MCV  84.4 80.0 - 100.0 fL   MCH 28.8 26.0 - 34.0 pg   MCHC 34.2 30.0 - 36.0 g/dL   RDW 14.8 11.5 - 15.5 %   Platelets 188 150 - 400 K/uL   nRBC 0.0 0.0 - 0.2 %   Neutrophils Relative % 92 %   Neutro Abs 14.3 (H) 1.7 - 7.7 K/uL   Lymphocytes Relative 4 %   Lymphs Abs 0.6 (L) 0.7 - 4.0 K/uL   Monocytes Relative 3 %   Monocytes Absolute 0.4 0.1 - 1.0 K/uL   Eosinophils Relative 0 %   Eosinophils Absolute 0.0 0.0 - 0.5 K/uL   Basophils Relative 0 %   Basophils Absolute 0.0 0.0 - 0.1 K/uL   Immature Granulocytes 1 %   Abs Immature Granulocytes 0.20 (H) 0.00 - 0.07 K/uL    Comment: Performed at [facility] [address] [place], [place] [address]  Heparin level (unfractionated)     Status: Abnormal   Collection Time: 11/18/19  5:15 AM  Result Value Ref Range   Heparin Unfractionated <0.10 (L) 0.30 - 0.70 IU/mL    Comment: (NOTE) If heparin results are below expected values, and patient dosage has  been confirmed, suggest follow up testing of antithrombin III levels. Performed at [facility], [place] [address], [place], [place] [address]   Basic metabolic panel     Status: Abnormal   Collection Time: 11/18/19  5:15 AM  Result Value Ref Range   Sodium 135 135 - 145 mmol/L   Potassium 3.6 3.5 - 5.1 mmol/L   Chloride 104 98 - 111 mmol/L   CO2 17 (L) 22 - 32 mmol/L   Glucose, Bld 196 (H) 70 - 99 mg/dL    Comment: Glucose reference range applies only to samples taken after fasting for at least 8 hours.  BUN 50 (H) 8 - 23 mg/dL   Creatinine, Ser 3.03 (H) 0.44 - 1.00 mg/dL   Calcium 8.0 (L) 8.9 - 10.3 mg/dL   GFR calc non Af Amer 14 (L) >60 mL/min   GFR calc Af Amer 17 (L) >60 mL/min   Anion gap 14 5 - 15    Comment: Performed at [facility] [address] [place], [place] [address]  Sodium, urine, random     Status: None   Collection Time: 11/18/19  8:16 AM  Result Value Ref Range   Sodium, Ur <10 mmol/L    Comment: Performed at [facility]  [address] [place], [place] [address]  Creatinine, urine, random     Status: None   Collection Time: 11/18/19  8:16 AM  Result Value Ref Range   Creatinine, Urine 80.41 mg/dL    Comment: Performed at [facility] [address] [place], [place] [address]  Urinalysis, Routine w reflex microscopic     Status: Abnormal   Collection Time: 11/18/19  8:16 AM  Result Value Ref Range   Color, Urine YELLOW YELLOW   APPearance CLEAR CLEAR   Specific Gravity, Urine 1.015 1.005 - 1.030   pH 5.5 5.0 - 8.0   Glucose, UA NEGATIVE NEGATIVE mg/dL   Hgb urine dipstick SMALL (A) NEGATIVE   Bilirubin Urine NEGATIVE NEGATIVE   Ketones, ur NEGATIVE NEGATIVE mg/dL   Protein, ur 30 (A) NEGATIVE mg/dL   Nitrite NEGATIVE NEGATIVE   Leukocytes,Ua SMALL (A) NEGATIVE    Comment: Performed at [facility] [address] [place], [place] [address]  Urinalysis, Microscopic (reflex)     Status: Abnormal   Collection Time: 11/18/19  8:16 AM  Result Value Ref Range   RBC / HPF 0-5 0 - 5 RBC/hpf   WBC, UA 6-10 0 - 5 WBC/hpf   Bacteria, UA FEW (A) NONE SEEN   Squamous Epithelial / LPF 0-5 0 - 5   Granular Casts, UA PRESENT    Urine-Other LESS THAN 10 mL OF URINE SUBMITTED     Comment: MICROSCOPIC EXAM PERFORMED ON UNCONCENTRATED URINE Performed at [facility], [place] [address] [place],  [address]      ROS:  Review of systems not obtained due to patient factors.  Physical Exam: Vitals:   11/18/19 1131 11/18/19 1200  BP:  (!) 95/57  Pulse:  79  Resp:  18  Temp: 99.7 F (37.6 C)   SpO2:  95%     General: Intubated, sedated but squirming-currently receiving an echocardiogram HEENT: Pupils are equal round reactive to light, extra motions are intact, mucous membranes are moist Neck: No JVD Heart: Regular rate and rhythm Lungs: Coarse breath sounds bilaterally Abdomen: Soft, nontender, nondistended Extremities: No appreciable peripheral edema Skin: Warm and dry Neuro: On  propofol  Assessment/Plan: 74 year old white female with psychiatric but also diffuse vascular disease.  Presented with urosepsis, initially improving on appropriate therapy-then developed respiratory failure etiology of which is not entirely clear-work-up in process 1.Renal-initially, patient had acute kidney injury in the setting of urosepsis which is not unusual.  With medical management she was improving.  She then developed hypoxemia and more notably hypotension.  Although initial urinalysis was suspicious with high levels of hematuria, the repeat is not.  Serologies are pending.  Given the scenario, I would favor just good old-fashioned ATN due to decompensation associated with respiratory failure.  This exacerbated already damaged kidneys from the pyelo  with bacteremia.  The evidence against pulmonary- renal syndrome would be the fact that it initially improved and also that repeat urinalysis does not show any RBCs along with the absence of DAH.  However, will await other serologies.  Right now, care is supportive.  She is nonoliguric.  No absolute needs for RRT at this time 2. Hypertension/volume  -does not appear overloaded.  Also does not appear wet.  No fluids or Lasix needed in my opinion.  I will stop the Norvasc given soft blood pressures 3.  Metabolic acidosis-on oral bicarb, okay to continue 4. Anemia  -has developed through course of hospitalization.  Will check iron stores and possibly give [place] 11/18/2019, 1:30 PM

## 2019-11-18 NOTE — Progress Notes (Signed)
  Echocardiogram 2D Echocardiogram has been performed.  Jennette Dubin 11/18/2019, 2:04 PM

## 2019-11-18 NOTE — Procedures (Signed)
Extubation Procedure Note  Patient Details:   Name: [name] DOB: Oct 25, 1945 MRN: [id number]   Airway Documentation:    Vent end date: 11/18/19 Vent end time: 1719   Evaluation  O2 sats: stable throughout Complications: No apparent complications Patient did tolerate procedure well. Bilateral Breath Sounds: Diminished   Yes   Pt was extubated and placed on 4 L [place] per Md order. Cuff leak was noted prior to extubation and no stridor was noted post. Pt is stable at this time. Rt will continue to monitor.   Ronaldo Miyamoto 11/18/2019, 5:20 PM

## 2019-11-18 NOTE — Consult Note (Signed)
NAME:  [name], MRN:  [id number], DOB:  March 06, 1946, LOS: 7 ADMISSION DATE:  11/10/2019, CONSULTATION DATE:  11/17/2019 REFERRING MD:  Dr. Horris Latino, CHIEF COMPLAINT:  Respiratory distress   Brief History   74yo female admitted with sepsis secondary to left pyelonephritis 3/1, PCCM consulted 3/8 due to increasing oxygen demand and AMS.   History of present illness   [name] is a 74yo female with a PMH significant for prior CVA, PAD, meningioma, hypothyroidism, hypertension, GERD, depression, bipolar disease, and anemia who presented to the emergency department with complaints of dysuria, urinary frequency, suprapubic abdominal pain, left-sided flank pain, and chills that began 1 week prior to admission.  Patient has reported low-grade fever and nausea.  Patient stated primary care physician referred to the emergency department for evaluation.  On arrival patient was seen with vital signs within normal limits.  Lab work significant for WBC of 17.1, NA 123, creatinine 2.5, and elevated LFTs.  UA with large leukocyte esterase and large amount of RBCs.  Chest x-ray with questionable interstitial lung disease and no acute cardiopulmonary findings.  Morning of 3/8 patient is seen with increasing O2 demands with escalation from room air to high flow nasal cannula to maintain sats greater than 92% with associated altered mental status.  Chest CT revealed patchy groundglass opacities and mild cardiomegaly concerning for flash pulmonary edema.  PCCM consulted for further assistance in management.   Past Medical History  Stroke PAD Meningioma hypothyroidism Hypertension GERD Gastritis Depression CAD Chronic constipation Bipolar disease Arthritis Anemia AAA 3.1 cm 06/2018  Significant Hospital Events   Admitted 3/1 3/8 transfer to progressive care due to increasing respiratory treatment status  Consults:    Procedures:    Significant Diagnostic Tests:  CT ABD.Pelvis 3/2 > 1. No  acute findings are noted in the abdomen or pelvis. Specifically, no unexpected perinephric fluid collections or other signs of complicated pyelonephritis. 2. Pulmonary fibrosis in the visualize lung bases, similar to prior studies. 3. Aortic atherosclerosis, as well as multi-vessel coronary artery disease and ectasia of the infrarenal abdominal aorta (mean diameter of 2.8 cm). 4. Additional incidental findings, as above.  CXR 3/5 > No acute disease in a patient with emphysema and chronic interstitial lung disease  Chest CTA 3/8 > 1. New severe patchy ground-glass opacity, consolidation and septal thickening throughout both lungs, most prominent in the upper lobes. Given the mild cardiomegaly and small dependent left pleural effusion, findings could represent flash pulmonary edema, although the differential includes multilobar pneumonia or diffuse alveolar hemorrhage. 2. Nonspecific mild mediastinal lymphadenopathy, increased since 09/26/2019 chest CT, possibly reactive. Suggest attention on follow-up chest CT with IV contrast in 3 months. 3. Two-vessel coronary atherosclerosis. 4. Aortic Atherosclerosis (ICD10-I70.0) and Emphysema (ICD10-J43.9).  Micro Data:  COVID 3/2 > negative  Blood culture 3/1 > Enterobacter and E.coli  Blood culture 3/7 >   Urine culture 3/8 >   Antimicrobials:  Ceftriaxone 3/2 > 3/8 Cefepime 3/8 >  Vancomycin 3/8 >   Interim history/subjective:   Intubated on mechanical life support  Objective   Blood pressure (!) 123/59, pulse 88, temperature 99.7 F (37.6 C), temperature source Axillary, resp. rate 17, height _0  (1.727 m), weight 71.7 kg, SpO2 97 %.    Vent Mode: CPAP;PSV FiO2 (%):  [30 %-100 %] 30 % Set Rate:  [16 bmp-25 bmp] 25 bmp Vt Set:  [510 mL] 510 mL PEEP:  [5 cmH20] 5 cmH20 Pressure Support:  [10 cmH20] 10 cmH20 Plateau Pressure:  [  15 cmH20-31 cmH20] 15 cmH20   Intake/Output Summary (Last 24 hours) at 11/18/2019 1628 Last data  filed at 11/18/2019 1500 Gross per 24 hour  Intake 1539.78 ml  Output 2330 ml  Net -790.22 ml   Filed Weights   11/11/19 0010  Weight: 71.7 kg    Examination: General: Elderly female intubated on mechanical life support HEENT: NCAT, mucous membranes moist endotracheal tube in place Neuro: Alert oriented on mechanical support following commands CV: Regular rate rhythm S1-S2 no MRG PULM: Bilateral ventilated breath sounds GI: Soft nontender nondistended Extremities: Significant edema Skin: No rash   Resolved Hospital Problem list     Assessment & Plan:   Sepsis likely due to E.coli UTI E.Coli Bacteremia P: Continue cefepime plus Vanco due to acute worsening respiratory failure, respiratory cultures pending bronchoscopy with BAL neutrophil predominance. Follow blood cultures Continue to follow temperature curve, CBC Keep mean arterial pressure greater than 65 Continue to follow urine output  Acute hypoxic respiratory failure  Bilateral infiltrates, concern for acute lung injury setting of sepsis I suspect related to sepsis syndrome P: Vasculitis labs negative, ANCA negative, GBM negative Remains on IV steroids Bronc cultures pending BAL pending BAL was neutrophil predominant., Likely infectious.  Acute Kidney Injury  -in the setting of acute illness. Baseline creatinine 0.8-0.9, creatinine on admission 2.18 -CT ABD/Pelvs with no acute findings P: Continue to follow renal function urine output Renal ultrasound pending Consultation to nephrology.  Chronic diastolic congestive heart failure  P: Echo pending  Anemia No sign of active bleeding. -Hold heparin was on Xarelto PTA.  Acute metabolic encephalopathy  -In the setting of acute bacteremia  P: Decreasing sedation needs Continue to observe  History of PVD with possible atheroemboli  -Home medications include xarelto  P: Can restart anticoagulation  Current daily smoker  Questionable COPD vs ILD   -Current 1PPD smoker  -CT chest with questionable ILD P: DuoNebs  Current liquid diarrhea  History of chronic constipation  P: Continue to observe   Best practice:  Diet: NPO Pain/Anxiety/Delirium protocol (if indicated): PRNs VAP protocol (if indicated): N/A DVT prophylaxis: SCD, heparin drip on hold GI prophylaxis: PPI Glucose control: SSI Mobility: bedrest  Code Status: Full Family Communication: Updated at bedside  Disposition: ICU  Labs   CBC: Recent Labs  Lab 11/14/19 0708 11/14/19 0708 11/15/19 0750 11/15/19 0750 11/16/19 0246 11/17/19 0159 11/17/19 1812 11/17/19 2059 11/18/19 0515  WBC 11.4*  --  13.4*  --  15.6* 21.3*  --   --  15.5*  NEUTROABS 9.4*  --  10.0*  --  12.1* 17.5*  --   --  14.3*  HGB 12.2   < > 11.9*   < > 11.2* 10.7* 11.2* 10.2* 9.4*  HCT 36.2   < > 34.4*   < > 32.2* 30.6* 33.0* 30.0* 27.5*  MCV 85.8  --  84.1  --  83.9 83.2  --   --  84.4  PLT 105*  --  105*  --  115* 157  --   --  188   < > = values in this interval not displayed.    Basic Metabolic Panel: Recent Labs  Lab 11/15/19 0750 11/15/19 0750 11/16/19 0246 11/16/19 0246 11/17/19 0159 11/17/19 1812 11/17/19 2059 11/17/19 2145 11/18/19 0515  NA 132*   < > 132*   < > 131* 135 136 132* 135  K 3.3*   < > 3.0*   < > 3.4* 3.1* 3.6 3.5 3.6  CL 103  --  103  --  103  --   --  103 104  CO2 16*  --  16*  --  17*  --   --  16* 17*  GLUCOSE 121*  --  126*  --  137*  --   --  165* 196*  BUN 53*  --  48*  --  44*  --   --  45* 50*  CREATININE 3.23*  --  2.96*  --  2.61*  --   --  2.74* 3.03*  CALCIUM 8.2*  --  8.0*  --  7.6*  --   --  7.5* 8.0*  MG  --   --   --   --   --   --   --  1.3*  --    < > = values in this interval not displayed.   GFR: Estimated Creatinine Clearance: 15.9 mL/min (A) (by C-G formula based on SCr of 3.03 mg/dL (H)). Recent Labs  Lab 11/15/19 0750 11/16/19 0246 11/17/19 0159 11/18/19 0515  PROCALCITON  --   --  2.80  --   WBC 13.4* 15.6* 21.3*  15.5*    Liver Function Tests: Recent Labs  Lab 11/12/19 0131  AST 38  ALT 84*  ALKPHOS 169*  BILITOT 0.7  PROT 6.0*  ALBUMIN 2.4*   No results for input(s): LIPASE, AMYLASE in the last 168 hours. No results for input(s): AMMONIA in the last 168 hours.  ABG    Component Value Date/Time   PHART 7.222 (L) 11/17/2019 2059   PCO2ART 47.4 11/17/2019 2059   PO2ART 211.0 (H) 11/17/2019 2059   HCO3 19.5 (L) 11/17/2019 2059   TCO2 21 (L) 11/17/2019 2059   ACIDBASEDEF 8.0 (H) 11/17/2019 2059   O2SAT 100.0 11/17/2019 2059     Coagulation Profile: No results for input(s): INR, PROTIME in the last 168 hours.  Cardiac Enzymes: No results for input(s): CKTOTAL, CKMB, CKMBINDEX, TROPONINI in the last 168 hours.  HbA1C: Hemoglobin A1C  Date/Time Value Ref Range Status  12/25/2012 02:29 PM 5.6  Final   Hgb A1c MFr Bld  Date/Time Value Ref Range Status  10/24/2017 08:46 AM 5.8 (H) 4.8 - 5.6 % Final    Comment:    (NOTE) Pre diabetes:          5.7%-6.4% Diabetes:              >6.4% Glycemic control for   <7.0% adults with diabetes     CBG: No results for input(s): GLUCAP in the last 168 hours.  Review of Systems:   Unable to assess due to confusion   Past Medical History  She,  has a past medical history of AAA (abdominal aortic aneurysm) ([place]), Anemia, Appendicitis with abscess, Arthritis, Bipolar disorder ([place]), Cerebrovascular disease, Colonic inertia, Constipation, Coronary artery disease, Depression, Duodenitis, EKG abnormalities, Gastritis, GERD (gastroesophageal reflux disease), Hypertension, Hypothyroid (01/16/2014), Meningioma ([place]) (10/21/2013), PAD (peripheral artery disease) ([place]), Psoriasis, Stroke ([place]), Wears dentures, and Wears glasses.   Surgical History    Past Surgical History:  Procedure Laterality Date  . ABDOMINAL AORTOGRAM W/LOWER EXTREMITY N/A 09/13/2018   Procedure: ABDOMINAL AORTOGRAM W/LOWER EXTREMITY;  Surgeon: Elam Dutch, MD;  Location: [facility];  Service: Cardiovascular;  Laterality: N/A;  . arthroscopy  04/2010   Right knee  . CATARACT EXTRACTION W/ INTRAOCULAR LENS  IMPLANT, BILATERAL    . COLONOSCOPY    . FEMORAL-POPLITEAL BYPASS GRAFT  10/22/2018  . FEMORAL-POPLITEAL BYPASS GRAFT Left 10/22/2018  Procedure: LEFT FEMORAL TO BELOW THE KNEE POPLITEAL ARTERY BYPASS GRAFT;  Surgeon: Elam Dutch, MD;  Location: [facility];  Service: Vascular;  Laterality: Left;  . I & D EXTREMITY Left 11/08/2018   Procedure: IRRIGATION AND DEBRIDEMENT EXTREMITY Left Leg;  Surgeon: Elam Dutch, MD;  Location: [facility];  Service: Vascular;  Laterality: Left;  . IR RADIOLOGIST EVAL & MGMT  07/30/2018  . MULTIPLE TOOTH EXTRACTIONS    . TUBAL LIGATION       Social History   reports that she has been smoking cigarettes. She has a 45.00 pack-year smoking history. She has never used smokeless tobacco. She reports current alcohol use. She reports that she does not use drugs.   Family History   Her family history includes Alcohol abuse in her brother; Breast cancer in her mother; Diabetes in her son; Heart disease in her brother, maternal aunt, and another family member; Lung cancer in her brother. There is no history of Colon cancer.   Allergies No Known Allergies   Home Medications  Prior to Admission medications   Medication Sig Start Date End Date Taking? Authorizing Provider  amLODipine (NORVASC) 10 MG tablet Take 1 tablet (10 mg total) by mouth daily. 03/07/19  Yes [name], MD  DULoxetine (CYMBALTA) 30 MG capsule Take 1 capsule (30 mg total) by mouth every evening. 04/15/19  Yes Cottle, Billey Co., MD  famotidine (PEPCID) 20 MG tablet One at bedtime Patient taking differently: Take 20 mg by mouth at bedtime. One at bedtime 02/14/16  Yes Tanda Rockers, MD  lamoTRIgine (LAMICTAL) 150 MG tablet TAKE 1 TABLET(150 MG) BY MOUTH TWICE DAILY Patient taking differently: Take 75 mg by mouth daily.  05/23/19  Yes Cottle, Billey Co.,  MD  linaclotide [facility]) 145 MCG CAPS capsule Take 1 capsule (145 mcg total) by mouth daily before breakfast. 05/14/19  Yes Willia Craze, NP  losartan (COZAAR) 50 MG tablet Take 1 tablet (50 mg total) by mouth daily. 03/07/19  Yes [name], MD  Melatonin 10 MG TABS Take 20 mg by mouth at bedtime.   Yes [provider]  polyethylene glycol powder (GLYCOLAX/MIRALAX) powder Take 17 g by mouth daily as needed for moderate constipation.    Yes [provider]  rOPINIRole (REQUIP) 2 MG tablet TAKE 1 AND 1/2 TABLETS(3 MG) BY MOUTH AT BEDTIME Patient taking differently: Take 3 mg by mouth at bedtime.  10/31/19  Yes Cottle, Billey Co., MD  clonazePAM (KLONOPIN) 0.5 MG tablet TAKE 1 TABLET BY MOUTH AT BEDTIME AS NEEDED FOR ANXIETY Patient taking differently: Take 0.5 mg by mouth 2 (two) times daily as needed for anxiety. TAKE 1 TABLET BY MOUTH AT BEDTIME FOR ANXIETY 10/22/19   Cottle, Billey Co., MD  rivaroxaban (XARELTO) 20 MG TABS tablet Take 1 tablet (20 mg total) by mouth daily with supper. 10/02/19   Elam Dutch, MD      This patient is critically ill with multiple organ system failure; which, requires frequent high complexity decision making, assessment, support, evaluation, and titration of therapies. This was completed through the application of advanced monitoring technologies and extensive interpretation of multiple databases. During this encounter critical care time was devoted to patient care services described in this note for 32 minutes.   Garner Nash, DO [facility] 11/18/2019 4:28 PM

## 2019-11-18 NOTE — Progress Notes (Signed)
Initial Nutrition Assessment  DOCUMENTATION CODES:   Not applicable  INTERVENTION:   Recommend Vital High Protein @ 50 ml/hr (1200 ml/day) once pt has enteral access  Provides: 1200 kcal, 105 grams protein, and 1003 ml free water.  TF regimen and propofol at current rate providing 1596 total kcal/day    NUTRITION DIAGNOSIS:   Inadequate oral intake related to inability to eat as evidenced by NPO status.  GOAL:   Patient will meet greater than or equal to 90% of their needs  MONITOR:   Vent status  REASON FOR ASSESSMENT:   Ventilator    ASSESSMENT:   Pt with PMH of AAA, CVA, CAD, HTN, gastritis, diffuse vascular disease, bipolar and depression who was admitted 3/2 with urosepsis.   Pt discussed during ICU rounds and with RN.  Pt initially improved but then developed hypoxemia and hypotension and intubated overnight.  Pt with no enteral access.   Spoke with son, who is from [place] and visits every 3-4 months. He was just here a month ago. He reports pt lives at home alone and gets a lot of help from his brother-in-law who lives next door. She has been less mobile per son.   Patient is currently intubated on ventilator support MV: 12.1 L/min Temp (24hrs), Avg:98.9 F (37.2 C), Min:98.4 F (36.9 C), Max:99.7 F (37.6 C)  Propofol: 15 ml/hr provides: 396 kcal  Medications reviewed and include: solumedrol, 650 mg sodium bicarb BID  Labs reviewed: BUN/Cr: 50/3.03 (H) UOP: 2450 ml    NUTRITION - FOCUSED PHYSICAL EXAM:    Most Recent Value  Orbital Region  No depletion  Upper Arm Region  No depletion  Thoracic and Lumbar Region  No depletion  Buccal Region  Unable to assess  Temple Region  Mild depletion  Clavicle Bone Region  No depletion  Clavicle and Acromion Bone Region  No depletion  Scapular Bone Region  Unable to assess  Dorsal Hand  Unable to assess  Patellar Region  No depletion  Anterior Thigh Region  No depletion  Posterior Calf Region  No  depletion  Edema (RD Assessment)  None  Hair  Reviewed  Eyes  Reviewed  Mouth  Reviewed  Skin  Reviewed  Nails  Unable to assess       Diet Order:   Diet Order            Diet Heart Room service appropriate? Yes; Fluid consistency: Thin  Diet effective now              EDUCATION NEEDS:   No education needs have been identified at this time  Skin:  Skin Assessment: Reviewed RN Assessment  Last BM:  3/8  Height:   Ht Readings from Last 1 Encounters:  11/11/19 5\' 8"  (1.727 m)    Weight:   Wt Readings from Last 1 Encounters:  11/11/19 71.7 kg    Ideal Body Weight:  63.6 kg  BMI:  Body mass index is 24.03 kg/m.  Estimated Nutritional Needs:   Kcal:  1653  Protein:  95-110 grams  Fluid:  >1.6 L/day  Lockie Pares., RD, LDN, CNSC See AMiON for contact information

## 2019-11-18 NOTE — Progress Notes (Signed)
Patient refusing CHG bath and oral care.

## 2019-11-19 ENCOUNTER — Inpatient Hospital Stay (HOSPITAL_COMMUNITY): Payer: Medicare Other

## 2019-11-19 LAB — CBC WITH DIFFERENTIAL/PLATELET
Abs Immature Granulocytes: 0.45 10*3/uL — ABNORMAL HIGH (ref 0.00–0.07)
Basophils Absolute: 0 10*3/uL (ref 0.0–0.1)
Basophils Relative: 0 %
Eosinophils Absolute: 0 10*3/uL (ref 0.0–0.5)
Eosinophils Relative: 0 %
HCT: 27.1 % — ABNORMAL LOW (ref 36.0–46.0)
Hemoglobin: 9.4 g/dL — ABNORMAL LOW (ref 12.0–15.0)
Immature Granulocytes: 2 %
Lymphocytes Relative: 4 %
Lymphs Abs: 0.9 10*3/uL (ref 0.7–4.0)
MCH: 29.2 pg (ref 26.0–34.0)
MCHC: 34.7 g/dL (ref 30.0–36.0)
MCV: 84.2 fL (ref 80.0–100.0)
Monocytes Absolute: 0.9 10*3/uL (ref 0.1–1.0)
Monocytes Relative: 5 %
Neutro Abs: 18 10*3/uL — ABNORMAL HIGH (ref 1.7–7.7)
Neutrophils Relative %: 89 %
Platelets: 280 10*3/uL (ref 150–400)
RBC: 3.22 MIL/uL — ABNORMAL LOW (ref 3.87–5.11)
RDW: 14.8 % (ref 11.5–15.5)
WBC: 20.3 10*3/uL — ABNORMAL HIGH (ref 4.0–10.5)
nRBC: 0 % (ref 0.0–0.2)

## 2019-11-19 LAB — CULTURE, RESPIRATORY W GRAM STAIN: Culture: NORMAL

## 2019-11-19 LAB — BASIC METABOLIC PANEL
Anion gap: 13 (ref 5–15)
BUN: 60 mg/dL — ABNORMAL HIGH (ref 8–23)
CO2: 18 mmol/L — ABNORMAL LOW (ref 22–32)
Calcium: 8.1 mg/dL — ABNORMAL LOW (ref 8.9–10.3)
Chloride: 104 mmol/L (ref 98–111)
Creatinine, Ser: 2.5 mg/dL — ABNORMAL HIGH (ref 0.44–1.00)
GFR calc Af Amer: 21 mL/min — ABNORMAL LOW (ref >60)
GFR calc non Af Amer: 18 mL/min — ABNORMAL LOW (ref >60)
Glucose, Bld: 173 mg/dL — ABNORMAL HIGH (ref 70–99)
Potassium: 3.5 mmol/L (ref 3.5–5.1)
Sodium: 135 mmol/L (ref 135–145)

## 2019-11-19 LAB — IRON AND TIBC
Iron: 50 ug/dL (ref 28–170)
Saturation Ratios: 22 % (ref 10.4–31.8)
TIBC: 232 ug/dL — ABNORMAL LOW (ref 250–450)
UIBC: 182 ug/dL

## 2019-11-19 LAB — FERRITIN: Ferritin: 500 ng/mL — ABNORMAL HIGH (ref 11–307)

## 2019-11-19 MED ORDER — ENSURE ENLIVE PO LIQD
237.0000 mL | Freq: Two times a day (BID) | ORAL | Status: DC
  Administered 2019-11-21 – 2019-11-23 (×2): 237 mL via ORAL

## 2019-11-19 MED ORDER — FUROSEMIDE 10 MG/ML IJ SOLN
40.0000 mg | Freq: Once | INTRAMUSCULAR | Status: AC
  Administered 2019-11-19: 40 mg via INTRAVENOUS
  Filled 2019-11-19: qty 4

## 2019-11-19 MED ORDER — POTASSIUM CHLORIDE CRYS ER 20 MEQ PO TBCR
40.0000 meq | EXTENDED_RELEASE_TABLET | Freq: Once | ORAL | Status: AC
  Administered 2019-11-19: 40 meq via ORAL
  Filled 2019-11-19: qty 2

## 2019-11-19 MED ORDER — ADULT MULTIVITAMIN W/MINERALS CH
1.0000 | ORAL_TABLET | Freq: Every day | ORAL | Status: DC
  Administered 2019-11-19 – 2019-11-24 (×4): 1 via ORAL
  Filled 2019-11-19 (×7): qty 1

## 2019-11-19 MED ORDER — DARBEPOETIN ALFA 60 MCG/0.3ML IJ SOSY
60.0000 ug | PREFILLED_SYRINGE | Freq: Once | INTRAMUSCULAR | Status: AC
  Administered 2019-11-19: 60 ug via SUBCUTANEOUS
  Filled 2019-11-19: qty 0.3

## 2019-11-19 MED ORDER — METHYLPREDNISOLONE SODIUM SUCC 125 MG IJ SOLR
60.0000 mg | Freq: Two times a day (BID) | INTRAMUSCULAR | Status: DC
  Administered 2019-11-19 – 2019-11-20 (×2): 60 mg via INTRAVENOUS
  Filled 2019-11-19 (×2): qty 2

## 2019-11-19 MED ORDER — RIVAROXABAN 2.5 MG PO TABS
2.5000 mg | ORAL_TABLET | Freq: Two times a day (BID) | ORAL | Status: DC
  Administered 2019-11-19 – 2019-11-27 (×14): 2.5 mg via ORAL
  Filled 2019-11-19 (×18): qty 1

## 2019-11-19 NOTE — Progress Notes (Signed)
ANTICOAGULATION CONSULT NOTE - Initial Consult  Pharmacy Consult for Xarelto Indication: PAD  No Known Allergies  Patient Measurements: Height: '5\' 8"'  (172.7 cm) Weight: 158 lb 1.1 oz (71.7 kg) IBW/kg (Calculated) : 63.9 Heparin Dosing Weight:   Vital Signs: Temp: 98.1 F (36.7 C) (03/10 0800) Temp Source: Oral (03/10 0800) BP: 129/62 (03/10 0700) Pulse Rate: 84 (03/10 0600)  Labs: Recent Labs    11/17/19 0159 11/17/19 0159 11/17/19 1117 11/17/19 1812 11/17/19 2059 11/17/19 2059 11/17/19 2145 11/18/19 0515 11/19/19 0548  HGB 10.7*  --   --    < > 10.2*   < >  --  9.4* 9.4*  HCT 30.6*  --   --    < > 30.0*  --   --  27.5* 27.1*  PLT 157  --   --   --   --   --   --  188 280  HEPARINUNFRC 0.26*  --  0.52  --   --   --   --  <0.10*  --   CREATININE 2.61*   < >  --   --   --   --  2.74* 3.03* 2.50*   < > = values in this interval not displayed.    Estimated Creatinine Clearance: 19.3 mL/min (A) (by C-G formula based on SCr of 2.5 mg/dL (H)).   Medical History: Past Medical History:  Diagnosis Date  . AAA (abdominal aortic aneurysm) (H[facility])    3.1 cm 07/08/18, 3 year follow-up recommended; possible 3 cm AAA by aortogram 09/13/18  . Anemia    PMH  . Appendicitis with abscess    07/08/18, s/p perc drain; resolved 07/30/18 by CT  . Arthritis   . Bipolar disorder ([place])   . Cerebrovascular disease    intra and extracranial vascular dx per MRI 4/11, neurology rec strict CVRF control  . Colonic inertia   . Constipation    chronic;severe  . Coronary artery disease   . Depression   . Duodenitis   . EKG abnormalities    changes, stress test neg (false EKG changes)  . Gastritis   . GERD (gastroesophageal reflux disease)   . Hypertension   . Hypothyroid 01/16/2014  . Meningioma ([place]) 10/21/2013  . PAD (peripheral artery disease) ([place])   . Psoriasis    sees derm  . Stroke ([place])   . Wears dentures   . Wears glasses     Medications:  Scheduled:  . chlorhexidine  gluconate (MEDLINE KIT)  15 mL Mouth Rinse BID  . Chlorhexidine Gluconate Cloth  6 each Topical Daily  . DULoxetine  30 mg Oral QPM  . famotidine  20 mg Oral QHS  . furosemide  40 mg Intravenous Once  . lamoTRIgine  75 mg Oral Daily  . linaclotide  145 mcg Oral QAC breakfast  . mouth rinse  15 mL Mouth Rinse 10 times per day  . Melatonin  18 mg Oral QHS  . methylPREDNISolone (SOLU-MEDROL) injection  60 mg Intravenous Q12H  . mometasone-formoterol  2 puff Inhalation BID  . rivaroxaban  2.5 mg Oral BID  . rOPINIRole  3 mg Oral QHS  . sodium bicarbonate  650 mg Oral BID    Assessment: 74 y/o female admitting with UTI and hematuria. Past medical history significant for AAA, bipolar disorder, cerebrovascular disease, CAD, depression, GERD, gastritis, hypertension, hypothyroidism, meningioma, PAD, psoriasis, CVA. Notably, she has been taking Xarelto 20 mg daily for PAD PTA, currently being held.  Recommended dose of Xarelto  for PAD is 2.5 mg twice daily.   Hemoglobin low stable at 9.4, platelet count 280. Per Dr. Vaughan Browner, the patient's hematuria has resolved. Patient has an AKI with downtrending Scr 4.11>2.5. Will restart Xarelto today at 2.5 mg twice daily.   Goal of Therapy:  Anticoagulation Monitor platelets by anticoagulation protocol: Yes   Plan:  - Start Xarelto 2.5 mg twice daily - Monitor for s/sx of bleeding, renal function improvement - F/u with outpatient vascular team to discuss continuation of current Xarelto dosing  Agnes Lawrence, PharmD PGY1 Pharmacy Resident

## 2019-11-19 NOTE — Progress Notes (Signed)
Pt transferred to 2W08. VSS. Nurse at bedside. Son aware of possible transfer. All belongings transferred with pt including phone and charger.

## 2019-11-19 NOTE — Progress Notes (Signed)
Rehab Admissions Coordinator Note:  Per PT/OT recommendation, patient was screened by Michel Santee for appropriateness for an Inpatient Acute Rehab Consult.  At this time, we are recommending Inpatient Rehab consult. Please place an IP Rehab MD consult order if pt would like to be considered.   Michel Santee 11/19/2019, 4:51 PM  I can be reached at [phone].

## 2019-11-19 NOTE — Progress Notes (Signed)
Subjective:  Events include extubation, UOP appears great- at least 1275-  crt down -  She is alert but a ittle scattered   Objective Vital signs in last 24 hours: Vitals:   11/19/19 0500 11/19/19 0600 11/19/19 0700 11/19/19 0800  BP: 135/71 127/61 129/62   Pulse: 78 84    Resp: (!) 23 (!) 22 19   Temp:    98.1 F (36.7 C)  TempSrc:    Oral  SpO2: 94% 93%    Weight:      Height:       Weight change:   Intake/Output Summary (Last 24 hours) at 11/19/2019 0915 Last data filed at 11/19/2019 0600 Gross per 24 hour  Intake 3052.98 ml  Output 1085 ml  Net 1967.98 ml    Assessment/Plan: 74 year old white female with psychiatric issues but also diffuse vascular disease.  Presented with urosepsis, initially improving on appropriate therapy-then developed respiratory failure etiology of which is not entirely clear-work-up in process-  Recurrent AKI 1.Renal-  Baseline crt 0.9 in December of 20.  initially, patient had acute kidney injury in the setting of urosepsis which is not unusual.  With medical management she was improving.  She then developed hypoxemia and more notably hypotension.  Although initial urinalysis was suspicious with high levels of hematuria, the repeat is not.  Serologies are negative.  Given the scenario, I would favor just good old-fashioned ATN due to decompensation associated with respiratory failure.  This exacerbated already damaged kidneys from the pyelo with bacteremia.  The evidence against pulmonary- renal syndrome would be the fact that it initially improved and also that repeat urinalysis does not show any RBCs along with the absence of DAH.   Right now, care is supportive.  She is nonoliguric and actually renal function improved from yesterday to today 2. Hypertension/volume  -does not appear overloaded.  Also does not appear wet.   I have stopped the Norvasc given soft blood pressures.  Looks like given lasix this AM 3.  Metabolic acidosis-on oral bicarb, okay to  continue 4. Anemia  -has developed through course of hospitalization.   iron stores low but given recent bacteremia will not replete.  Will give one dose of ESA  5.  resp failure-  Per CCM-  On steroids, await results from bronch - per pulm going to dec steroids dosing  6.  K-  Will give some given borderline low     Louis Meckel    Labs: Basic Metabolic Panel: Recent Labs  Lab 11/17/19 2145 11/18/19 0515 11/19/19 0548  NA 132* 135 135  K 3.5 3.6 3.5  CL 103 104 104  CO2 16* 17* 18*  GLUCOSE 165* 196* 173*  BUN 45* 50* 60*  CREATININE 2.74* 3.03* 2.50*  CALCIUM 7.5* 8.0* 8.1*   Liver Function Tests: No results for input(s): AST, ALT, ALKPHOS, BILITOT, PROT, ALBUMIN in the last 168 hours. No results for input(s): LIPASE, AMYLASE in the last 168 hours. No results for input(s): AMMONIA in the last 168 hours. CBC: Recent Labs  Lab 11/15/19 0750 11/15/19 0750 11/16/19 0246 11/16/19 0246 11/17/19 0159 11/17/19 1812 11/17/19 2059 11/18/19 0515 11/19/19 0548  WBC 13.4*   < > 15.6*   < > 21.3*  --   --  15.5* 20.3*  NEUTROABS 10.0*   < > 12.1*   < > 17.5*  --   --  14.3* 18.0*  HGB 11.9*   < > 11.2*   < > 10.7*   < >  10.2* 9.4* 9.4*  HCT 34.4*   < > 32.2*   < > 30.6*   < > 30.0* 27.5* 27.1*  MCV 84.1  --  83.9  --  83.2  --   --  84.4 84.2  PLT 105*   < > 115*   < > 157  --   --  188 280   < > = values in this interval not displayed.   Cardiac Enzymes: No results for input(s): CKTOTAL, CKMB, CKMBINDEX, TROPONINI in the last 168 hours. CBG: No results for input(s): GLUCAP in the last 168 hours.  Iron Studies:  Recent Labs    11/19/19 0548  IRON 50  TIBC 232*  FERRITIN 500*   Studies/Results: US RENAL  Result Date: 11/18/2019 CLINICAL DATA:  Renal failure. EXAM: RENAL / URINARY TRACT ULTRASOUND COMPLETE COMPARISON:  CT abdomen and pelvis of 11/11/2019 FINDINGS: Right Kidney: Renal measurements: 12 x 4.8 x 6.1 cm = volume: 183 mL . Echogenicity within  normal limits. No mass or hydronephrosis visualized. Left Kidney: Renal measurements: 14 x 5.4 x 7.1 cm = volume: 280 mL. Echogenicity within normal limits. No mass or hydronephrosis visualized. Trace perinephric fluid/edema seen on the left. Bladder: Foley catheter within a decompressed urinary bladder. Other: Signs of small to moderate left pleural effusion incidentally noted. Also with small gallstone in the gallbladder. IMPRESSION: 1. No signs of hydronephrosis. Trace perinephric fluid on the left without focal, organized collection. 2. Foley catheter in decompressed urinary bladder. 3. Small to moderate left effusion. 4. Cholelithiasis. Electronically Signed   By: Zetta Bills M.D.   On: 11/18/2019 09:49   DG CHEST PORT 1 VIEW  Result Date: 11/19/2019 CLINICAL DATA:  Sepsis. EXAM: PORTABLE CHEST 1 VIEW COMPARISON:  11/18/2019 FINDINGS: The endotracheal tube has been removed. Persistent diffuse interstitial and airspace process in the lungs. No pleural effusions. No pneumothorax. IMPRESSION: Removal of ET tube. Persistent diffuse interstitial and airspace process. Electronically Signed   By: Marijo Sanes M.D.   On: 11/19/2019 08:26   DG CHEST PORT 1 VIEW  Result Date: 11/18/2019 CLINICAL DATA:  ETT, sepsis EXAM: PORTABLE CHEST 1 VIEW COMPARISON:  11/17/2019 FINDINGS: Endotracheal tube terminates 7 cm above the carina. Diffuse interstitial/airspace opacities bilaterally, unchanged. Mild bibasilar atelectasis. No pleural effusion or pneumothorax. The heart is normal in size.  Thoracic aortic atherosclerosis. IMPRESSION: Endotracheal tube terminates 7 cm above the carina. Multifocal airspace opacities bilaterally, favoring multifocal pneumonia over interstitial edema. Electronically Signed   By: Julian Hy M.D.   On: 11/18/2019 09:20   DG CHEST PORT 1 VIEW  Result Date: 11/17/2019 CLINICAL DATA:  Mechanical ventilation EXAM: PORTABLE CHEST 1 VIEW COMPARISON:  November 14, 2019 FINDINGS: There are  worsening airspace opacities bilaterally. The endotracheal tube terminates approximately 5.3 cm above the carina. The heart size is stable from prior study. Aortic calcifications are noted. There is no pneumothorax. There may be trace bilateral pleural effusions. There is no acute osseous abnormality. IMPRESSION: 1. Endotracheal tube as above. 2. Again noted are scattered bilateral airspace opacities as seen on recent CT. 3. No pneumothorax. Electronically Signed   By: Constance Holster M.D.   On: 11/17/2019 19:08   ECHOCARDIOGRAM COMPLETE  Result Date: 11/18/2019    ECHOCARDIOGRAM REPORT   Patient Name:   [name] Date of Exam: 11/18/2019 Medical Rec #:  [id number]      Height:       68.0 in Accession #:    [id number]  Weight:       158.1 lb Date of Birth:  [birth year]       BSA:          1.849 m Patient Age:    39 years       BP:           95/57 mmHg Patient Gender: F              HR:           79 bpm. Exam Location:  Inpatient Procedure: 2D Echo Indications:    Chest Pain R07.9  History:        Patient has prior history of Echocardiogram examinations, most                 recent 10/25/2017. CAD; Risk Factors:Hypertension.  Sonographer:    Mikki Santee RDCS (AE) Referring Phys: [id number] Nash  Sonographer Comments: Echo performed with patient supine and on artificial respirator. IMPRESSIONS  1. Left ventricular ejection fraction, by estimation, is 70 to 75%. The left ventricle has hyperdynamic function. The left ventricle has no regional wall motion abnormalities. There is mild concentric left ventricular hypertrophy. Left ventricular diastolic parameters are consistent with Grade I diastolic dysfunction (impaired relaxation). Elevated left atrial pressure.  2. Right ventricular systolic function is normal. The right ventricular size is normal. There is moderately elevated pulmonary artery systolic pressure.  3. The mitral valve is normal in structure. No evidence of mitral valve  regurgitation. No evidence of mitral stenosis.  4. Aortic valve gradients are increased due to increased cardiac output (consdier anemia, sepsis, thyrotoxicosis, etc.). The aortic valve is normal in structure. Aortic valve regurgitation is trivial. Mild aortic valve sclerosis is present, with no evidence of aortic valve stenosis.  5. The inferior vena cava is dilated in size with <50% respiratory variability, suggesting right atrial pressure of 15 mmHg. Comparison(s): Prior images reviewed side by side. Changes from prior study are noted. There is evidence of high cardiac output and increased filling pressures. FINDINGS  Left Ventricle: Left ventricular ejection fraction, by estimation, is 70 to 75%. The left ventricle has hyperdynamic function. The left ventricle has no regional wall motion abnormalities. The left ventricular internal cavity size was normal in size. There is mild concentric left ventricular hypertrophy. Left ventricular diastolic parameters are consistent with Grade I diastolic dysfunction (impaired relaxation). Elevated left atrial pressure. Right Ventricle: The right ventricular size is normal. No increase in right ventricular wall thickness. Right ventricular systolic function is normal. There is moderately elevated pulmonary artery systolic pressure. The tricuspid regurgitant velocity is 2.88 m/s, and with an assumed right atrial pressure of 15 mmHg, the estimated right ventricular systolic pressure is 03.5 mmHg. Left Atrium: Left atrial size was normal in size. Right Atrium: Right atrial size was normal in size. Pericardium: There is no evidence of pericardial effusion. Mitral Valve: The mitral valve is normal in structure. Normal mobility of the mitral valve leaflets. No evidence of mitral valve regurgitation. No evidence of mitral valve stenosis. Tricuspid Valve: The tricuspid valve is normal in structure. Tricuspid valve regurgitation is trivial. No evidence of tricuspid stenosis. Aortic  Valve: Aortic valve gradients are increased due to increased cardiac output (consdier anemia, sepsis, thyrotoxicosis, etc.). The aortic valve is normal in structure. Aortic valve regurgitation is trivial. Mild aortic valve sclerosis is present, with no evidence of aortic valve stenosis. Aortic valve mean gradient measures 13.5 mmHg. Aortic valve peak gradient measures 26.8 mmHg. Aortic valve area, by  VTI measures 2.07 cm. Pulmonic Valve: The pulmonic valve was normal in structure. Pulmonic valve regurgitation is not visualized. No evidence of pulmonic stenosis. Aorta: The aortic root is normal in size and structure. Venous: IVC dilation is a nonspecific finding during positive pressure ventilation. The inferior vena cava is dilated in size with less than 50% respiratory variability, suggesting right atrial pressure of 15 mmHg. IAS/Shunts: No atrial level shunt detected by color flow Doppler.  LEFT VENTRICLE PLAX 2D LVIDd:         4.13 cm  Diastology LVIDs:         2.27 cm  LV e' lateral:   7.40 cm/s LV PW:         1.39 cm  LV E/e' lateral: 14.1 LV IVS:        1.19 cm  LV e' medial:    6.64 cm/s LVOT diam:     2.00 cm  LV E/e' medial:  15.7 LV SV:         107 LV SV Index:   58 LVOT Area:     3.14 cm  RIGHT VENTRICLE RV S prime:     14.10 cm/s TAPSE (M-mode): 2.5 cm LEFT ATRIUM             Index       RIGHT ATRIUM           Index LA diam:        3.40 cm 1.84 cm/m  RA Area:     12.60 cm LA Vol (A2C):   66.6 ml 36.01 ml/m RA Volume:   23.70 ml  12.82 ml/m LA Vol (A4C):   50.5 ml 27.31 ml/m LA Biplane Vol: 59.5 ml 32.17 ml/m  AORTIC VALVE AV Area (Vmax):    2.21 cm AV Area (Vmean):   2.27 cm AV Area (VTI):     2.07 cm AV Vmax:           259.00 cm/s AV Vmean:          170.500 cm/s AV VTI:            0.517 m AV Peak Grad:      26.8 mmHg AV Mean Grad:      13.5 mmHg LVOT Vmax:         182.00 cm/s LVOT Vmean:        123.000 cm/s LVOT VTI:          0.340 m LVOT/AV VTI ratio: 0.66  AORTA Ao Root diam: 3.20 cm MITRAL  VALVE                TRICUSPID VALVE MV Area (PHT): 3.21 cm     TR Peak grad:   33.2 mmHg MV Decel Time: 236 msec     TR Vmax:        288.00 cm/s MV E velocity: 104.00 cm/s MV A velocity: 146.00 cm/s  SHUNTS MV E/A ratio:  0.71         Systemic VTI:  0.34 m                             Systemic Diam: 2.00 cm Dani Gobble Croitoru MD Electronically signed by Sanda Klein MD Signature Date/Time: 11/18/2019/3:02:21 PM    Final    Medications: Infusions: . sodium chloride 10 mL/hr at 11/18/19 1900  . ceFEPime (MAXIPIME) IV Stopped (11/18/19 1733)  . fentaNYL infusion INTRAVENOUS    . propofol (DIPRIVAN) infusion Stopped (  11/18/19 1635)    Scheduled Medications: . chlorhexidine gluconate (MEDLINE KIT)  15 mL Mouth Rinse BID  . Chlorhexidine Gluconate Cloth  6 each Topical Daily  . DULoxetine  30 mg Oral QPM  . famotidine  20 mg Oral QHS  . furosemide  40 mg Intravenous Once  . lamoTRIgine  75 mg Oral Daily  . linaclotide  145 mcg Oral QAC breakfast  . mouth rinse  15 mL Mouth Rinse 10 times per day  . Melatonin  18 mg Oral QHS  . methylPREDNISolone (SOLU-MEDROL) injection  60 mg Intravenous Q12H  . mometasone-formoterol  2 puff Inhalation BID  . rivaroxaban  2.5 mg Oral BID  . rOPINIRole  3 mg Oral QHS  . sodium bicarbonate  650 mg Oral BID    have reviewed scheduled and prn medications.  Physical Exam: General: alert, a little confused I would say-  Sitting up - trying to eat sandwich  Heart: RRR Lungs: mostly clear Abdomen: soft, non tender Extremities: min edema    11/19/2019,9:15 AM  LOS: 8 days

## 2019-11-19 NOTE — Evaluation (Signed)
Occupational Therapy Evaluation Patient Details Name: [name] MRN: [id number] DOB: 1946-07-21 Today's Date: 11/19/2019    History of Present Illness [name] is a 74yo female with a PMH significant for prior CVA, PAD, meningioma, hypothyroidism, hypertension, GERD, depression, bipolar disease, and anemia who presented to the ED on 3/1 with complaints of dysuria, urinary frequency, suprapubic abdominal pain, left-sided flank pain, and chills that began 1 week prior to admission. Pt with diagnosis of complicated UTI, with increasing AMS. Ct chest revealed ground glass opacities and mild cardiomegaly concerning for flash pulmonary edema, vs PNA, vs diffuse alveolar hemorrhage. ETT 3/8-3/9.   Clinical Impression   This 74 y/o female presents with the above. PTA pt reports independence with ADL, iADL and functional mobility. Pt now presenting with impaired cognition, generalized weakness, decreased endurance and respiratory status with activity. Pt currently requiring two person assist for safe completion of functional transfers, requiring up to maxA for LB ADL, minguard-minA for seated UB ADL. Pt tolerated OOB to recliner this session, reporting dizziness with initial standing and with some orthostasis (see general comments below), on 6L O2 throughout. Pt quite tangential during session and requiring frequent redirection throughout session completion. She will benefit from continued acute OT services and currently feel she is appropriate for CIR level therapies at time of discharge to maximize her safety and independence with ADL and mobility. Will follow.     Follow Up Recommendations  CIR;Supervision/Assistance - 24 hour    Equipment Recommendations  Other (comment)(TBD)           Precautions / Restrictions Precautions Precautions: Fall Restrictions Weight Bearing Restrictions: No      Mobility Bed Mobility Overal bed mobility: Needs Assistance Bed Mobility: Supine to Sit      Supine to sit: Min assist     General bed mobility comments: for trunk elevation, close guarding for safety while scooting to EOB  Transfers Overall transfer level: Needs assistance Equipment used: Rolling walker (2 wheeled) Transfers: Sit to/from [facility] Sit to Stand: Min assist;+2 physical assistance;+2 safety/equipment Stand pivot transfers: Min assist;+2 physical assistance;+2 safety/equipment       General transfer comment: steadying assist to rise to RW, pt able to stand and march in place, while standing pt reporting increased dizziness and with increased SOB therefore opted to transfer to recliner, deferring additional mobility    Balance Overall balance assessment: Needs assistance Sitting-balance support: Feet supported Sitting balance-Leahy Scale: Fair     Standing balance support: Bilateral upper extremity supported Standing balance-Leahy Scale: Poor Standing balance comment: reliant on UE support, external assist                           ADL either performed or assessed with clinical judgement   ADL Overall ADL's : Needs assistance/impaired Eating/Feeding: Set up;Supervision/ safety;Sitting   Grooming: Wash/dry face;Brushing hair;Set up;Min guard;Sitting Grooming Details (indicate cue type and reason): completing while seated in recliner Upper Body Bathing: Minimal assistance;Sitting   Lower Body Bathing: Moderate assistance;Sit to/from stand;+2 for physical assistance;+2 for safety/equipment   Upper Body Dressing : Min guard;Minimal assistance;Sitting   Lower Body Dressing: Moderate assistance;Sit to/from stand;+2 for physical assistance;+2 for safety/equipment   Toilet Transfer: Minimal assistance;+2 for physical assistance;+2 for safety/equipment;Stand-pivot;RW Toilet Transfer Details (indicate cue type and reason): simulated via transfer to [place] and Hygiene: Moderate assistance;Maximal  assistance;+2 for physical assistance;+2 for safety/equipment;Sit to/from stand       Functional mobility  during ADLs: Minimal assistance;+2 for physical assistance;+2 for safety/equipment;Rolling walker General ADL Comments: pt with weakness, decreased activity tolerance, impaired cognition     Vision         Perception     Praxis      Pertinent Vitals/Pain Pain Assessment: No/denies pain     Hand Dominance     Extremity/Trunk Assessment Upper Extremity Assessment Upper Extremity Assessment: Generalized weakness   Lower Extremity Assessment Lower Extremity Assessment: Defer to PT evaluation       Communication Communication Communication: No difficulties   Cognition Arousal/Alertness: Awake/alert Behavior During Therapy: Impulsive;WFL for tasks assessed/performed Overall Cognitive Status: Impaired/Different from baseline Area of Impairment: Orientation;Attention;Memory;Following commands;Safety/judgement;Awareness;Problem solving                 Orientation Level: Disoriented to;Place;Situation Current Attention Level: Sustained Memory: Decreased recall of precautions;Decreased short-term memory Following Commands: Follows one step commands inconsistently;Follows one step commands with increased time Safety/Judgement: Decreased awareness of safety;Decreased awareness of deficits Awareness: Emergent Problem Solving: Slow processing;Requires verbal cues;Requires tactile cues General Comments: pt requires questioning cues to correctly identify location, during one instance pt thinking she is on a ship. pt easily distracted and requires frequent redirection. Requires cues for safety as she is eager/impulsive to attempt to stand prior to receiving instruction to do so   General Comments  BP start of session 126/80, seated EOB 139/95, post transfer to recliner 121/65. Pt on 6L O2, difficult to obtain good pleth for O2 reading during session entirety but O2 sats  overall maintaining in the upper 80s-90s, maintaining >92% while seated in recliner     Exercises     Shoulder Instructions      Home Living Family/patient expects to be discharged to:: Private residence Living Arrangements: Alone Available Help at Discharge: Family                             Additional Comments: difficult to obtain full home setup info as pt with impaired cognition and having difficulty remaining on task to respond to questions      Prior Functioning/Environment Level of Independence: Independent                 OT Problem List: Decreased strength;Decreased range of motion;Decreased activity tolerance;Impaired balance (sitting and/or standing);Decreased safety awareness;Decreased knowledge of use of DME or AE;Cardiopulmonary status limiting activity;Decreased knowledge of precautions;Decreased cognition      OT Treatment/Interventions: Self-care/ADL training;Therapeutic exercise;Energy conservation;DME and/or AE instruction;Therapeutic activities;Patient/family education;Balance training;Cognitive remediation/compensation    OT Goals(Current goals can be found in the care plan section) Acute Rehab OT Goals Patient Stated Goal: return to independence OT Goal Formulation: With patient Time For Goal Achievement: 12/03/19 Potential to Achieve Goals: Good  OT Frequency: Min 2X/week   Barriers to D/C:            Co-evaluation PT/OT/SLP Co-Evaluation/Treatment: Yes Reason for Co-Treatment: For patient/therapist safety;To address functional/ADL transfers;Necessary to address cognition/behavior during functional activity   OT goals addressed during session: ADL's and self-care      AM-PAC OT "6 Clicks" Daily Activity     Outcome Measure Help from another person eating meals?: None Help from another person taking care of personal grooming?: A Little Help from another person toileting, which includes using toliet, bedpan, or urinal?: A  Lot Help from another person bathing (including washing, rinsing, drying)?: A Lot Help from another person to put on and taking off regular upper body clothing?:  A Little Help from another person to put on and taking off regular lower body clothing?: A Lot 6 Click Score: 16   End of Session Equipment Utilized During Treatment: Gait belt;Rolling walker;Oxygen Nurse Communication: Mobility status  Activity Tolerance: Patient tolerated treatment well Patient left: in chair;with call bell/phone within reach;with chair alarm set;with nursing/sitter in room  OT Visit Diagnosis: Unsteadiness on feet (R26.81);Muscle weakness (generalized) (M62.81);Other symptoms and signs involving cognitive function                Time: [id number] OT Time Calculation (min): 28 min Charges:  OT General Charges $OT Visit: 1 Visit OT Evaluation $OT Eval Moderate Complexity: [place], OT Acute Rehabilitation Services Pager [phone] Office [phone]   Raymondo Band 11/19/2019, 1:44 PM

## 2019-11-19 NOTE — Evaluation (Signed)
Physical Therapy Evaluation Patient Details Name: [name] MRN: [id number] DOB: Dec 12, 1945 Today's Date: 11/19/2019   History of Present Illness  [name] is a 74yo female with a PMH significant for prior CVA, PAD, meningioma, hypothyroidism, hypertension, GERD, depression, bipolar disease, and anemia who presented to the ED on 3/1 with complaints of dysuria, urinary frequency, suprapubic abdominal pain, left-sided flank pain, and chills that began 1 week prior to admission. Pt with diagnosis of complicated UTI, with increasing AMS. Ct chest revealed ground glass opacities and mild cardiomegaly concerning for flash pulmonary edema, vs PNA, vs diffuse alveolar hemorrhage. ETT 3/8-3/9.  Clinical Impression   Pt presents with altered mental status, generalized weakness, difficulty performing mobility tasks, poor safety awareness with impulsivity, dyspnea on exertion on 6LO2 (sats high 80s-90s%) and decreased activity tolerance. Pt to benefit from acute PT to address deficits. Pt required +2 assist for safe mobility this session, with multimodal cuing for for form and safety. Per pt, she lives alone and is completely independent at baseline. PT recommending CIR consult to maximize pt mobility and independence post-acutely, per pt her brother-in-law and nephew can provide 24/7 assist post-acutely but will need to be verified by family given pt's AMS.  PT to progress mobility as tolerated, and will continue to follow acutely.   BP start of session:126/80 BP, seated: 139/95 BP, post-transfer with +dizziness: 121/65   Follow Up Recommendations CIR    Equipment Recommendations  Other (comment)(defer)    Recommendations for Other Services Rehab consult     Precautions / Restrictions Precautions Precautions: Fall Restrictions Weight Bearing Restrictions: No      Mobility  Bed Mobility Overal bed mobility: Needs Assistance Bed Mobility: Supine to Sit     Supine to sit: Min assist     General bed mobility comments: for trunk elevation, close guarding for safety while scooting to EOB  Transfers Overall transfer level: Needs assistance Equipment used: Rolling walker (2 wheeled) Transfers: Sit to/from [facility] Sit to Stand: Min assist;+2 physical assistance;+2 safety/equipment Stand pivot transfers: +2 physical assistance;+2 safety/equipment;Min assist       General transfer comment: Min assist for power up, steadying. Verbal cuing for hand placement when rising, and cues to wait for PT/OT assist as pt attempted to stand without help. Pt limited by dizziness in standing, requires min assist +2 for guiding to recliner at bedside, pt able to take pivotal steps to recliner. + orthostatics.  Ambulation/Gait             General Gait Details: unable to assess this session, orthostasis  Stairs            Wheelchair Mobility    Modified Rankin (Stroke Patients Only)       Balance Overall balance assessment: Needs assistance Sitting-balance support: Feet supported Sitting balance-Leahy Scale: Fair     Standing balance support: Bilateral upper extremity supported Standing balance-Leahy Scale: Poor Standing balance comment: reliant on UE support, external assist                             Pertinent Vitals/Pain Pain Assessment: No/denies pain    Home Living Family/patient expects to be discharged to:: Private residence Living Arrangements: Alone Available Help at Discharge: Family           Home Equipment: Kasandra Knudsen - single point;Walker - 2 wheels Additional Comments: difficult to obtain full home setup info as pt with impaired cognition and having difficulty remaining  on task to respond to questions. Pt states "they are trying to make me sell my house" (unsure of who they are, pt did not elaborate)    Prior Function Level of Independence: Independent         Comments: pt reports taking care of self and  ambulating without assist, but does state her brother-in-law brings her groceries from time to time.     Hand Dominance   Dominant Hand: Right    Extremity/Trunk Assessment   Upper Extremity Assessment Upper Extremity Assessment: Defer to OT evaluation    Lower Extremity Assessment Lower Extremity Assessment: Generalized weakness;LLE deficits/detail LLE Deficits / Details: LLE functionally weaker than R, especially antigravity hip flexion    Cervical / Trunk Assessment Cervical / Trunk Assessment: Normal  Communication   Communication: No difficulties  Cognition Arousal/Alertness: Awake/alert Behavior During Therapy: Impulsive;WFL for tasks assessed/performed Overall Cognitive Status: Impaired/Different from baseline Area of Impairment: Orientation;Attention;Memory;Following commands;Safety/judgement;Awareness;Problem solving                 Orientation Level: Disoriented to;Place;Situation Current Attention Level: Sustained Memory: Decreased recall of precautions;Decreased short-term memory Following Commands: Follows one step commands inconsistently;Follows one step commands with increased time Safety/Judgement: Decreased awareness of safety;Decreased awareness of deficits Awareness: Emergent Problem Solving: Slow processing;Requires verbal cues;Requires tactile cues General Comments: pt requires questioning cues to correctly identify location, during one instance pt thinking she is on a ship and states "aren't those ships out there?". pt easily distracted and requires frequent redirection. Requires cues for safety as she is eager/impulsive to attempt to stand prior to receiving instruction to do so      General Comments General comments (skin integrity, edema, etc.): BP start of session 126/80, seated EOB 139/95, post transfer to recliner 121/65. Pt on 6L O2, difficult to obtain good pleth for O2 reading during session entirety but O2 sats overall maintaining in the  upper 80s-90s, maintaining >92% while seated in recliner     Exercises     Assessment/Plan    PT Assessment Patient needs continued PT services  PT Problem List Decreased strength;Decreased mobility;Decreased safety awareness;Decreased activity tolerance;Decreased balance;Decreased knowledge of use of DME;Decreased cognition;Cardiopulmonary status limiting activity       PT Treatment Interventions DME instruction;Therapeutic activities;Gait training;Therapeutic exercise;Patient/family education;Balance training;Stair training;Neuromuscular re-education;Functional mobility training    PT Goals (Current goals can be found in the Care Plan section)  Acute Rehab PT Goals Patient Stated Goal: return to independence PT Goal Formulation: With patient Time For Goal Achievement: 12/03/19 Potential to Achieve Goals: Good    Frequency Min 3X/week   Barriers to discharge        Co-evaluation PT/OT/SLP Co-Evaluation/Treatment: Yes Reason for Co-Treatment: For patient/therapist safety;Necessary to address cognition/behavior during functional activity;To address functional/ADL transfers PT goals addressed during session: Mobility/safety with mobility;Proper use of DME OT goals addressed during session: ADL's and self-care       AM-PAC PT "6 Clicks" Mobility  Outcome Measure Help needed turning from your back to your side while in a flat bed without using bedrails?: A Little Help needed moving from lying on your back to sitting on the side of a flat bed without using bedrails?: A Little Help needed moving to and from a bed to a chair (including a wheelchair)?: A Lot Help needed standing up from a chair using your arms (e.g., wheelchair or bedside chair)?: A Lot Help needed to walk in hospital room?: Total Help needed climbing 3-5 steps with a railing? : Total 6 Click  Score: 12    End of Session Equipment Utilized During Treatment: Gait belt Activity Tolerance: Patient limited by  fatigue Patient left: in chair;with chair alarm set;with call bell/phone within reach;with nursing/sitter in room Nurse Communication: Mobility status PT Visit Diagnosis: Unsteadiness on feet (R26.81);Muscle weakness (generalized) (M62.81);Difficulty in walking, not elsewhere classified (R26.2)    Time: [id number] PT Time Calculation (min) (ACUTE ONLY): 28 min   Charges:   PT Evaluation $PT Eval Moderate Complexity: 1 Mod          Latifah Padin E, PT Acute Rehabilitation Services Pager [phone]  Office [phone]   Lynnzie Blackson D Elonda Husky 11/19/2019, 4:31 PM

## 2019-11-19 NOTE — Progress Notes (Signed)
NAME:  [name], MRN:  [id number], DOB:  [birth year], LOS: 8 ADMISSION DATE:  11/10/2019, CONSULTATION DATE:  11/17/2019 REFERRING MD:  Dr. Horris Latino, CHIEF COMPLAINT:  Respiratory distress   Brief History   74yo female admitted with sepsis secondary to left pyelonephritis 3/1, PCCM consulted 3/8 due to increasing oxygen demand and AMS.   History of present illness   [name] is a 74yo female with a PMH significant for prior CVA, PAD, meningioma, hypothyroidism, hypertension, GERD, depression, bipolar disease, and anemia who presented to the emergency department with complaints of dysuria, urinary frequency, suprapubic abdominal pain, left-sided flank pain, and chills that began 1 week prior to admission.  Patient has reported low-grade fever and nausea.  Patient stated primary care physician referred to the emergency department for evaluation.  On arrival patient was seen with vital signs within normal limits.  Lab work significant for WBC of 17.1, NA 123, creatinine 2.5, and elevated LFTs.  UA with large leukocyte esterase and large amount of RBCs.  Chest x-ray with questionable interstitial lung disease and no acute cardiopulmonary findings.  Morning of 3/8 patient is seen with increasing O2 demands with escalation from room air to high flow nasal cannula to maintain sats greater than 92% with associated altered mental status.  Chest CT revealed patchy groundglass opacities and mild cardiomegaly concerning for flash pulmonary edema.  PCCM consulted for further assistance in management.   Past Medical History  Stroke PAD Meningioma hypothyroidism Hypertension GERD Gastritis Depression CAD Chronic constipation Bipolar disease Arthritis Anemia AAA 3.1 cm 06/2018  Significant Hospital Events   3/1 Admit 3/8 transfer to progressive care due to increasing respiratory treatment status, intubated, BAL performed 3/9 Extubated  Consults:    Procedures:    Significant Diagnostic  Tests:  CT ABD.Pelvis 3/2 > No acute findings, visualized lung bases show pulmonary fibrosis.  Chest CTA 3/8  Patchy bilateral groundglass opacities, consolidation mostly in the upper lobe, nonspecific lymphadenopathy  Micro Data:  COVID 3/2 > negative  Blood culture 3/1 > Enterobacter and E.coli  Blood culture 3/7 >   Urine culture 3/7 > multiple species BAL 3/9 >   Antimicrobials:  Ceftriaxone 3/2 > 3/8 Cefepime 3/8 >  Vancomycin 3/8 >   Interim history/subjective:   Extubated yesterday, respiratory status and hemodynamics remain stable No complaints today.  She is just frustrated about difficulty ordering lunch  Objective   Blood pressure 129/62, pulse 84, temperature 98.1 F (36.7 C), temperature source Oral, resp. rate 19, height _0  (1.727 m), weight 71.7 kg, SpO2 93 %.    Vent Mode: CPAP;PSV FiO2 (%):  [30 %] 30 % PEEP:  [5 cmH20] 5 cmH20 Pressure Support:  [10 cmH20] 10 cmH20   Intake/Output Summary (Last 24 hours) at 11/19/2019 0835 Last data filed at 11/19/2019 0600 Gross per 24 hour  Intake 3075.85 ml  Output 1085 ml  Net 1990.85 ml   Filed Weights   11/11/19 0010  Weight: 71.7 kg    Examination: Gen:      Elderly female, HEENT:  EOMI, sclera anicteric Neck:     No masses; no thyromegaly Lungs:   Bibasal crackles.  No wheeze CV:         Regular rate and rhythm; no murmurs Abd:      + bowel sounds; soft, non-tender; no palpable masses, no distension Ext:    No edema; adequate peripheral perfusion Skin:      Warm and dry; no rash Neuro: Awake, no gross focal  deficits  Labs significant for glucose 173, BUN/creatinine 60/2.50, WBC 20.3, hemoglobin 9.4, platelets 280  Resolved Hospital Problem list     Assessment & Plan:   Sepsis likely due to E.coli UTI, present on admission E.Coli Bacteremia P: Continue cefepime.  Can DC Vanco as MRSA PCR is negative no staph aureus on culture Follow final BAL cultures  Acute hypoxic respiratory failure    Bilateral infiltrates,  Likely has acute lung injury setting of sepsis There is concern for pulmonary renal syndrome, vasculitis however there is no DAH on bronchoscopy and serologies are negative except for borderline rheumatoid arthritis.  I do not feel there is an autoimmune process here.  P: Wean down oxygen as tolerated Start weaning steroids. Repeat ANA as they were borderline positive last year and did not result during this admission.  Acute Kidney Injury  -in the setting of acute illness. Baseline creatinine 0.8-0.9, creatinine on admission 2.18 -CT ABD/Pelvs with no acute findings P: Continue to follow renal function urine output Nephrology on board.  Chronic diastolic congestive heart failure  Likely has a component of pulmonary edema P: Lasix 40 mg x 1  Anemia No sign of active bleeding. 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    Marshell Garfinkel MD [facility] Please see [email or web address] for pager details.  11/19/2019, 8:58 AM

## 2019-11-19 NOTE — Progress Notes (Signed)
Nutrition Follow-up  DOCUMENTATION CODES:   Not applicable  INTERVENTION:   -MVI with minerals daily -Ensure Enlive po BID, each supplement provides 350 kcal and 20 grams of protein -Magic cup TID with meals, each supplement provides 290 kcal and 9 grams of protein  NUTRITION DIAGNOSIS:   Inadequate oral intake related to inability to eat as evidenced by NPO status.  Progressing; advanced to heart healthy diet  GOAL:   Patient will meet greater than or equal to 90% of their needs  Progressing   MONITOR:   PO intake, Supplement acceptance, Labs, Weight trends, Skin, I & O's  REASON FOR ASSESSMENT:   Ventilator    ASSESSMENT:   Pt with PMH of AAA, CVA, CAD, HTN, gastritis, diffuse vascular disease, bipolar and depression who was admitted 3/2 with urosepsis.  3/9- extubated 3/10- advanced to heart healthy diet  Reviewed I/O's: +1.8 L x 24 hours and +9.7 L since admission  UOP: 1.3 L x 24 hours  Spoke with pt, who was sitting in her recliner char at time of visit. She was pleasant, but easily distractible- she was fixated on charging her phone. She reports her appetite is fair and ate "some of a chicken salad sandwich and avocado" (checked in [employer] meal ordering system and confirmed that this was what she ordered). Noted meal completion 25-50%. Pt shares that she does very well with drinking fluids, noted water and soda in styrofoam cups on tray table.   Visit was cut short due to pt's son calling on her cell phone.   Labs reviewed.   Diet Order:   Diet Order            Diet Heart Room service appropriate? Yes with Assist; Fluid consistency: Thin  Diet effective 0500              EDUCATION NEEDS:   No education needs have been identified at this time  Skin:  Skin Assessment: Reviewed RN Assessment  Last BM:  11/17/19  Height:   Ht Readings from Last 1 Encounters:  11/11/19 5\' 8"  (1.727 m)    Weight:   Wt Readings from Last 1 Encounters:   11/11/19 71.7 kg    Ideal Body Weight:  63.6 kg  BMI:  Body mass index is 24.03 kg/m.  Estimated Nutritional Needs:   Kcal:  1700-1900  Protein:  90-105 grams  Fluid:  > 1.7 L    Loistine Chance, RD, LDN, [place] Registered Dietitian II Certified Diabetes Care and Education Specialist Please refer to [facility] for RD and/or RD on-call/weekend/after hours pager

## 2019-11-20 ENCOUNTER — Inpatient Hospital Stay (HOSPITAL_COMMUNITY): Payer: Medicare Other

## 2019-11-20 ENCOUNTER — Telehealth: Payer: Self-pay | Admitting: Family Medicine

## 2019-11-20 LAB — MAGNESIUM: Magnesium: 1.8 mg/dL (ref 1.7–2.4)

## 2019-11-20 LAB — BASIC METABOLIC PANEL
Anion gap: 14 (ref 5–15)
Anion gap: 14 (ref 5–15)
Anion gap: 15 (ref 5–15)
BUN: 63 mg/dL — ABNORMAL HIGH (ref 8–23)
BUN: 63 mg/dL — ABNORMAL HIGH (ref 8–23)
BUN: 65 mg/dL — ABNORMAL HIGH (ref 8–23)
CO2: 21 mmol/L — ABNORMAL LOW (ref 22–32)
CO2: 20 mmol/L — ABNORMAL LOW (ref 22–32)
CO2: 23 mmol/L (ref 22–32)
Calcium: 7.9 mg/dL — ABNORMAL LOW (ref 8.9–10.3)
Calcium: 7.9 mg/dL — ABNORMAL LOW (ref 8.9–10.3)
Calcium: 7.8 mg/dL — ABNORMAL LOW (ref 8.9–10.3)
Chloride: 101 mmol/L (ref 98–111)
Chloride: 102 mmol/L (ref 98–111)
Chloride: 99 mmol/L (ref 98–111)
Creatinine, Ser: 2.01 mg/dL — ABNORMAL HIGH (ref 0.44–1.00)
Creatinine, Ser: 1.87 mg/dL — ABNORMAL HIGH (ref 0.44–1.00)
Creatinine, Ser: 2.08 mg/dL — ABNORMAL HIGH (ref 0.44–1.00)
GFR calc Af Amer: 27 mL/min — ABNORMAL LOW (ref >60)
GFR calc Af Amer: 30 mL/min — ABNORMAL LOW (ref >60)
GFR calc Af Amer: 26 mL/min — ABNORMAL LOW (ref >60)
GFR calc non Af Amer: 24 mL/min — ABNORMAL LOW (ref >60)
GFR calc non Af Amer: 26 mL/min — ABNORMAL LOW (ref >60)
GFR calc non Af Amer: 23 mL/min — ABNORMAL LOW (ref >60)
Glucose, Bld: 176 mg/dL — ABNORMAL HIGH (ref 70–99)
Glucose, Bld: 182 mg/dL — ABNORMAL HIGH (ref 70–99)
Glucose, Bld: 222 mg/dL — ABNORMAL HIGH (ref 70–99)
Potassium: 3.2 mmol/L — ABNORMAL LOW (ref 3.5–5.1)
Potassium: 3.9 mmol/L (ref 3.5–5.1)
Potassium: 3.3 mmol/L — ABNORMAL LOW (ref 3.5–5.1)
Sodium: 136 mmol/L (ref 135–145)
Sodium: 136 mmol/L (ref 135–145)
Sodium: 137 mmol/L (ref 135–145)

## 2019-11-20 LAB — CBC WITH DIFFERENTIAL/PLATELET
Abs Immature Granulocytes: 0.28 10*3/uL — ABNORMAL HIGH (ref 0.00–0.07)
Basophils Absolute: 0 10*3/uL (ref 0.0–0.1)
Basophils Relative: 0 %
Eosinophils Absolute: 0 10*3/uL (ref 0.0–0.5)
Eosinophils Relative: 0 %
HCT: 26.3 % — ABNORMAL LOW (ref 36.0–46.0)
Hemoglobin: 9.1 g/dL — ABNORMAL LOW (ref 12.0–15.0)
Immature Granulocytes: 2 %
Lymphocytes Relative: 4 %
Lymphs Abs: 0.7 10*3/uL (ref 0.7–4.0)
MCH: 29.5 pg (ref 26.0–34.0)
MCHC: 34.6 g/dL (ref 30.0–36.0)
MCV: 85.4 fL (ref 80.0–100.0)
Monocytes Absolute: 1 10*3/uL (ref 0.1–1.0)
Monocytes Relative: 6 %
Neutro Abs: 14.2 10*3/uL — ABNORMAL HIGH (ref 1.7–7.7)
Neutrophils Relative %: 88 %
Platelets: 301 10*3/uL (ref 150–400)
RBC: 3.08 MIL/uL — ABNORMAL LOW (ref 3.87–5.11)
RDW: 15 % (ref 11.5–15.5)
WBC: 16.2 10*3/uL — ABNORMAL HIGH (ref 4.0–10.5)
nRBC: 0 % (ref 0.0–0.2)

## 2019-11-20 LAB — PHOSPHORUS: Phosphorus: 4 mg/dL (ref 2.5–4.6)

## 2019-11-20 LAB — BRAIN NATRIURETIC PEPTIDE: B Natriuretic Peptide: 330.3 pg/mL — ABNORMAL HIGH (ref 0.0–100.0)

## 2019-11-20 LAB — PROCALCITONIN: Procalcitonin: 0.86 ng/mL

## 2019-11-20 MED ORDER — PREDNISONE 20 MG PO TABS
40.0000 mg | ORAL_TABLET | Freq: Every day | ORAL | Status: DC
  Administered 2019-11-21 – 2019-11-24 (×4): 40 mg via ORAL
  Filled 2019-11-20 (×5): qty 2

## 2019-11-20 MED ORDER — POLYETHYLENE GLYCOL 3350 17 G PO PACK: 17.0000 g | PACK | Freq: Every day | ORAL | Status: DC | PRN

## 2019-11-20 MED ORDER — SENNOSIDES-DOCUSATE SODIUM 8.6-50 MG PO TABS
2.0000 | ORAL_TABLET | Freq: Every evening | ORAL | Status: DC | PRN
  Administered 2019-11-20 – 2019-11-24 (×2): 2 via ORAL
  Filled 2019-11-20 (×2): qty 2

## 2019-11-20 MED ORDER — FUROSEMIDE 10 MG/ML IJ SOLN
80.0000 mg | Freq: Four times a day (QID) | INTRAMUSCULAR | Status: AC
  Administered 2019-11-20: 80 mg via INTRAVENOUS
  Filled 2019-11-20: qty 8

## 2019-11-20 MED ORDER — POTASSIUM CHLORIDE 10 MEQ/100ML IV SOLN
10.0000 meq | INTRAVENOUS | Status: AC
  Administered 2019-11-20: 10 meq via INTRAVENOUS
  Filled 2019-11-20: qty 100

## 2019-11-20 MED ORDER — POTASSIUM CHLORIDE CRYS ER 20 MEQ PO TBCR: 40.0000 meq | EXTENDED_RELEASE_TABLET | Freq: Once | ORAL | Status: DC

## 2019-11-20 MED ORDER — POTASSIUM CHLORIDE CRYS ER 20 MEQ PO TBCR
40.0000 meq | EXTENDED_RELEASE_TABLET | Freq: Once | ORAL | Status: AC
  Administered 2019-11-20: 40 meq via ORAL
  Filled 2019-11-20: qty 2

## 2019-11-20 NOTE — Telephone Encounter (Signed)
[name] Pls call pt

## 2019-11-20 NOTE — Progress Notes (Signed)
Arrived to patient's room. Patient refused assessment for placement of PIV at this time. Notified Minette Headland, Therapist, sports. Fran Lowes, RN VAST

## 2019-11-20 NOTE — Telephone Encounter (Signed)
Patient called distressed pleading to speak with Dr. Carlota Raspberry . Tried transferring to [place] . Looks like patient hung up / transfer not successful. CMA will reach  out to patient

## 2019-11-20 NOTE — Progress Notes (Signed)
Patient has refused all medications, this RN educated her on risk for not taking each medication. She is holding firm that today she will not take medications. Will continue to educate patient and encourage her to take her medications.

## 2019-11-20 NOTE — Progress Notes (Signed)
Patient has no IV, she is a difficult stick. IV team came to insert a midline and patient is refusing to let then attempt to insert a midline.Educated patient why we need the midline and why she is on IV lasix and IV antibiotics.Will continue to educated and encourage patient.

## 2019-11-20 NOTE — Discharge Instructions (Signed)
Information on my medicine - XARELTO (Rivaroxaban)  This medication education was reviewed with me or my healthcare representative as part of my discharge preparation.    Why was Xarelto prescribed for you? Xarelto was prescribed for you to reduce the risk of blood clots forming . The medical term for these abnormal blood clots is venous thromboembolism (VTE).  What do you need to know about xarelto ? Take your Xarelto 2.5mg  TWICE DAILY at the same time every day. (**dose change from prior to hospitalization**) You may take it either with or without food.  If you have difficulty swallowing the tablet whole, you may crush it and mix in applesauce just prior to taking your dose.  Take Xarelto exactly as prescribed by your doctor and DO NOT stop taking Xarelto without talking to the doctor who prescribed the medication.  Stopping without other VTE prevention medication to take the place of Xarelto may increase your risk of developing a clot.  After discharge, you should have regular check-up appointments with your healthcare provider that is prescribing your Xarelto.    What do you do if you miss a dose? If you miss a dose, take it as soon as you remember on the same day then continue your regularly scheduled regimen.  Important Safety Information A possible side effect of Xarelto is bleeding. You should call your healthcare provider right away if you experience any of the following: ? Bleeding from an injury or your nose that does not stop. ? Unusual colored urine (red or dark brown) or unusual colored stools (red or black). ? Unusual bruising for unknown reasons. ? A serious fall or if you hit your head (even if there is no bleeding).  Some medicines may interact with Xarelto and might increase your risk of bleeding while on Xarelto. To help avoid this, consult your healthcare provider or pharmacist prior to using any new prescription or non-prescription medications, including  herbals, vitamins, non-steroidal anti-inflammatory drugs (NSAIDs) and supplements.  This website has more information on Xarelto: [email or web address]

## 2019-11-20 NOTE — Progress Notes (Signed)
Subjective:  Great UOP-  crt down again to 2.0- clearer    Objective Vital signs in last 24 hours: Vitals:   11/19/19 1600 11/19/19 1713 11/19/19 2020 11/20/19 0031  BP: 132/74 133/68 (!) 148/77 125/61  Pulse:  81 81 (!) 25  Resp:  '20 17 18  ' Temp: 97.9 F (36.6 C) 97.9 F (36.6 C) 97.9 F (36.6 C) 98.7 F (37.1 C)  TempSrc: Oral Oral Oral Oral  SpO2: 99% 95% 96% (!) 89%  Weight:      Height:       Weight change:   Intake/Output Summary (Last 24 hours) at 11/20/2019 0711 Last data filed at 11/19/2019 1600 Gross per 24 hour  Intake 914.55 ml  Output 2400 ml  Net -1485.45 ml    Assessment/Plan: 74 year old white female with psychiatric issues but also diffuse vascular disease.  Presented with urosepsis, initially improving on appropriate therapy-then developed respiratory failure etiology of which is not entirely clear-work-up in process-  Recurrent AKI 1.Renal-  Baseline crt 0.9 in December of 20.  initially, patient had acute kidney injury in the setting of urosepsis.  With medical management she was improving.  She then developed hypoxemia and more notably hypotension.  Although initial urinalysis was suspicious with high hematuria, the repeat is not.  Serologies are negative.  Given the scenario, I would favor just good old-fashioned ATN due to decompensation associated with respiratory failure.  This exacerbated already damaged kidneys from the pyelo with bacteremia.   Right now, care is supportive.  She is nonoliguric renal function has improved for 2 days in a row 2. Hypertension/volume  -does not appear overloaded.  Also does not appear wet.   I had stopped the Norvasc given soft blood pressures.  Great UOP  Possibly auto diuresing 3.  Metabolic acidosis-on oral bicarb, okay to continue, is improving 4. Anemia  -has developed through course of hospitalization.   iron stores low but given recent bacteremia will not replete.  Will give one dose of ESA on 3/10 5.  resp failure-   Per CCM-  On steroids, await results from bronch - per pulm going to dec steroids dosing  6.  K-  Will give another 40 PO today  Hopefully anticipate continued improvement in renal function. Renal will follow labs but not see daily-  Call with questions    Louis Meckel    Labs: Basic Metabolic Panel: Recent Labs  Lab 11/18/19 0515 11/19/19 0548 11/20/19 0346  NA 135 135 136  K 3.6 3.5 3.2*  CL 104 104 101  CO2 17* 18* 21*  GLUCOSE 196* 173* 176*  BUN 50* 60* 63*  CREATININE 3.03* 2.50* 2.01*  CALCIUM 8.0* 8.1* 7.9*  PHOS  --   --  4.0   Liver Function Tests: No results for input(s): AST, ALT, ALKPHOS, BILITOT, PROT, ALBUMIN in the last 168 hours. No results for input(s): LIPASE, AMYLASE in the last 168 hours. No results for input(s): AMMONIA in the last 168 hours. CBC: Recent Labs  Lab 11/16/19 0246 11/16/19 0246 11/17/19 0159 11/17/19 1812 11/18/19 0515 11/19/19 0548 11/20/19 0346  WBC 15.6*   < > 21.3*  --  15.5* 20.3* 16.2*  NEUTROABS 12.1*   < > 17.5*  --  14.3* 18.0* 14.2*  HGB 11.2*   < > 10.7*   < > 9.4* 9.4* 9.1*  HCT 32.2*   < > 30.6*   < > 27.5* 27.1* 26.3*  MCV 83.9  --  83.2  --  84.4 84.2 85.4  PLT 115*   < > 157  --  188 280 301   < > = values in this interval not displayed.   Cardiac Enzymes: No results for input(s): CKTOTAL, CKMB, CKMBINDEX, TROPONINI in the last 168 hours. CBG: No results for input(s): GLUCAP in the last 168 hours.  Iron Studies:  Recent Labs    11/19/19 0548  IRON 50  TIBC 232*  FERRITIN 500*   Studies/Results: US RENAL  Result Date: 11/18/2019 CLINICAL DATA:  Renal failure. EXAM: RENAL / URINARY TRACT ULTRASOUND COMPLETE COMPARISON:  CT abdomen and pelvis of 11/11/2019 FINDINGS: Right Kidney: Renal measurements: 12 x 4.8 x 6.1 cm = volume: 183 mL . Echogenicity within normal limits. No mass or hydronephrosis visualized. Left Kidney: Renal measurements: 14 x 5.4 x 7.1 cm = volume: 280 mL. Echogenicity within  normal limits. No mass or hydronephrosis visualized. Trace perinephric fluid/edema seen on the left. Bladder: Foley catheter within a decompressed urinary bladder. Other: Signs of small to moderate left pleural effusion incidentally noted. Also with small gallstone in the gallbladder. IMPRESSION: 1. No signs of hydronephrosis. Trace perinephric fluid on the left without focal, organized collection. 2. Foley catheter in decompressed urinary bladder. 3. Small to moderate left effusion. 4. Cholelithiasis. Electronically Signed   By: Zetta Bills M.D.   On: 11/18/2019 09:49   DG CHEST PORT 1 VIEW  Result Date: 11/19/2019 CLINICAL DATA:  Sepsis. EXAM: PORTABLE CHEST 1 VIEW COMPARISON:  11/18/2019 FINDINGS: The endotracheal tube has been removed. Persistent diffuse interstitial and airspace process in the lungs. No pleural effusions. No pneumothorax. IMPRESSION: Removal of ET tube. Persistent diffuse interstitial and airspace process. Electronically Signed   By: Marijo Sanes M.D.   On: 11/19/2019 08:26   DG CHEST PORT 1 VIEW  Result Date: 11/18/2019 CLINICAL DATA:  ETT, sepsis EXAM: PORTABLE CHEST 1 VIEW COMPARISON:  11/17/2019 FINDINGS: Endotracheal tube terminates 7 cm above the carina. Diffuse interstitial/airspace opacities bilaterally, unchanged. Mild bibasilar atelectasis. No pleural effusion or pneumothorax. The heart is normal in size.  Thoracic aortic atherosclerosis. IMPRESSION: Endotracheal tube terminates 7 cm above the carina. Multifocal airspace opacities bilaterally, favoring multifocal pneumonia over interstitial edema. Electronically Signed   By: Julian Hy M.D.   On: 11/18/2019 09:20   ECHOCARDIOGRAM COMPLETE  Result Date: 11/18/2019    ECHOCARDIOGRAM REPORT   Patient Name:   [name] Date of Exam: 11/18/2019 Medical Rec #:  [id number]      Height:       68.0 in Accession #:    [id number]     Weight:       158.1 lb Date of Birth:  [birth year]       BSA:          1.849 m Patient Age:     9 years       BP:           95/57 mmHg Patient Gender: F              HR:           79 bpm. Exam Location:  Inpatient Procedure: 2D Echo Indications:    Chest Pain R07.9  History:        Patient has prior history of Echocardiogram examinations, most                 recent 10/25/2017. CAD; Risk Factors:Hypertension.  Sonographer:    Mikki Santee RDCS (AE) Referring Phys: [id number] Williams  Sonographer Comments: Echo performed with patient supine and on artificial respirator. IMPRESSIONS  1. Left ventricular ejection fraction, by estimation, is 70 to 75%. The left ventricle has hyperdynamic function. The left ventricle has no regional wall motion abnormalities. There is mild concentric left ventricular hypertrophy. Left ventricular diastolic parameters are consistent with Grade I diastolic dysfunction (impaired relaxation). Elevated left atrial pressure.  2. Right ventricular systolic function is normal. The right ventricular size is normal. There is moderately elevated pulmonary artery systolic pressure.  3. The mitral valve is normal in structure. No evidence of mitral valve regurgitation. No evidence of mitral stenosis.  4. Aortic valve gradients are increased due to increased cardiac output (consdier anemia, sepsis, thyrotoxicosis, etc.). The aortic valve is normal in structure. Aortic valve regurgitation is trivial. Mild aortic valve sclerosis is present, with no evidence of aortic valve stenosis.  5. The inferior vena cava is dilated in size with <50% respiratory variability, suggesting right atrial pressure of 15 mmHg. Comparison(s): Prior images reviewed side by side. Changes from prior study are noted. There is evidence of high cardiac output and increased filling pressures. FINDINGS  Left Ventricle: Left ventricular ejection fraction, by estimation, is 70 to 75%. The left ventricle has hyperdynamic function. The left ventricle has no regional wall motion abnormalities. The left ventricular  internal cavity size was normal in size. There is mild concentric left ventricular hypertrophy. Left ventricular diastolic parameters are consistent with Grade I diastolic dysfunction (impaired relaxation). Elevated left atrial pressure. Right Ventricle: The right ventricular size is normal. No increase in right ventricular wall thickness. Right ventricular systolic function is normal. There is moderately elevated pulmonary artery systolic pressure. The tricuspid regurgitant velocity is 2.88 m/s, and with an assumed right atrial pressure of 15 mmHg, the estimated right ventricular systolic pressure is 64.1 mmHg. Left Atrium: Left atrial size was normal in size. Right Atrium: Right atrial size was normal in size. Pericardium: There is no evidence of pericardial effusion. Mitral Valve: The mitral valve is normal in structure. Normal mobility of the mitral valve leaflets. No evidence of mitral valve regurgitation. No evidence of mitral valve stenosis. Tricuspid Valve: The tricuspid valve is normal in structure. Tricuspid valve regurgitation is trivial. No evidence of tricuspid stenosis. Aortic Valve: Aortic valve gradients are increased due to increased cardiac output (consdier anemia, sepsis, thyrotoxicosis, etc.). The aortic valve is normal in structure. Aortic valve regurgitation is trivial. Mild aortic valve sclerosis is present, with no evidence of aortic valve stenosis. Aortic valve mean gradient measures 13.5 mmHg. Aortic valve peak gradient measures 26.8 mmHg. Aortic valve area, by VTI measures 2.07 cm. Pulmonic Valve: The pulmonic valve was normal in structure. Pulmonic valve regurgitation is not visualized. No evidence of pulmonic stenosis. Aorta: The aortic root is normal in size and structure. Venous: IVC dilation is a nonspecific finding during positive pressure ventilation. The inferior vena cava is dilated in size with less than 50% respiratory variability, suggesting right atrial pressure of 15 mmHg.  IAS/Shunts: No atrial level shunt detected by color flow Doppler.  LEFT VENTRICLE PLAX 2D LVIDd:         4.13 cm  Diastology LVIDs:         2.27 cm  LV e' lateral:   7.40 cm/s LV PW:         1.39 cm  LV E/e' lateral: 14.1 LV IVS:        1.19 cm  LV e' medial:    6.64 cm/s LVOT diam:  2.00 cm  LV E/e' medial:  15.7 LV SV:         107 LV SV Index:   58 LVOT Area:     3.14 cm  RIGHT VENTRICLE RV S prime:     14.10 cm/s TAPSE (M-mode): 2.5 cm LEFT ATRIUM             Index       RIGHT ATRIUM           Index LA diam:        3.40 cm 1.84 cm/m  RA Area:     12.60 cm LA Vol (A2C):   66.6 ml 36.01 ml/m RA Volume:   23.70 ml  12.82 ml/m LA Vol (A4C):   50.5 ml 27.31 ml/m LA Biplane Vol: 59.5 ml 32.17 ml/m  AORTIC VALVE AV Area (Vmax):    2.21 cm AV Area (Vmean):   2.27 cm AV Area (VTI):     2.07 cm AV Vmax:           259.00 cm/s AV Vmean:          170.500 cm/s AV VTI:            0.517 m AV Peak Grad:      26.8 mmHg AV Mean Grad:      13.5 mmHg LVOT Vmax:         182.00 cm/s LVOT Vmean:        123.000 cm/s LVOT VTI:          0.340 m LVOT/AV VTI ratio: 0.66  AORTA Ao Root diam: 3.20 cm MITRAL VALVE                TRICUSPID VALVE MV Area (PHT): 3.21 cm     TR Peak grad:   33.2 mmHg MV Decel Time: 236 msec     TR Vmax:        288.00 cm/s MV E velocity: 104.00 cm/s MV A velocity: 146.00 cm/s  SHUNTS MV E/A ratio:  0.71         Systemic VTI:  0.34 m                             Systemic Diam: 2.00 cm Dani Gobble Croitoru MD Electronically signed by Sanda Klein MD Signature Date/Time: 11/18/2019/3:02:21 PM    Final    Medications: Infusions: . sodium chloride Stopped (11/19/19 1859)  . ceFEPime (MAXIPIME) IV Stopped (11/19/19 1537)  . fentaNYL infusion INTRAVENOUS    . propofol (DIPRIVAN) infusion Stopped (11/18/19 1635)    Scheduled Medications: . chlorhexidine gluconate (MEDLINE KIT)  15 mL Mouth Rinse BID  . Chlorhexidine Gluconate Cloth  6 each Topical Daily  . DULoxetine  30 mg Oral QPM  . famotidine  20 mg  Oral QHS  . feeding supplement (ENSURE ENLIVE)  237 mL Oral BID BM  . lamoTRIgine  75 mg Oral Daily  . linaclotide  145 mcg Oral QAC breakfast  . mouth rinse  15 mL Mouth Rinse 10 times per day  . Melatonin  18 mg Oral QHS  . methylPREDNISolone (SOLU-MEDROL) injection  60 mg Intravenous Q12H  . mometasone-formoterol  2 puff Inhalation BID  . multivitamin with minerals  1 tablet Oral Daily  . rivaroxaban  2.5 mg Oral BID  . rOPINIRole  3 mg Oral QHS  . sodium bicarbonate  650 mg Oral BID    have reviewed scheduled and prn medications.  Physical  Exam: General: alert, clearer Heart: RRR Lungs: mostly clear Abdomen: soft, non tender Extremities: min edema    11/20/2019,7:11 AM  LOS: 9 days

## 2019-11-20 NOTE — Progress Notes (Signed)
Pharmacy Antibiotic Note  [name] is a 74 y.o. female admitted on 11/10/2019 with E.coli UTI and started on Rocephin.  Antibiotic then broaden to cover for PNA.  Currently on day #4 of cefepime (total abx day #10).    Renal function is improving, afebrile, WBC trending down.  Plan: Continue cefepime 2g IV Q24H Monitor renal fxn, clinical progress  Height: _0  (172.7 cm) Weight: 158 lb 1.1 oz (71.7 kg) IBW/kg (Calculated) : 63.9  Temp (24hrs), Avg:98.1 F (36.7 C), Min:97.9 F (36.6 C), Max:98.7 F (37.1 C)  Recent Labs  Lab 11/16/19 0246 11/16/19 0246 11/17/19 0159 11/17/19 2145 11/18/19 0515 11/19/19 0548 11/20/19 0346  WBC 15.6*  --  21.3*  --  15.5* 20.3* 16.2*  CREATININE 2.96*   < > 2.61* 2.74* 3.03* 2.50* 2.01*   < > = values in this interval not displayed.    Estimated Creatinine Clearance: 24 mL/min (A) (by C-G formula based on SCr of 2.01 mg/dL (H)).    No Known Allergies   CTX 3/2> 3/8 Vanc 3/8 >> 3/10 Cefepime 3/8 >>  3/1 BCx - E.coli (pan sensitive except I to amp) 3/2 covid - negative 3/7 UCx - negative 3/7 BCx - NGTD 3/8 BAL - negative 3/9 MRSA PCR - negative  Noeh Sparacino D. Mina Marble, PharmD, BCPS, [place] 11/20/2019, 10:04 AM

## 2019-11-20 NOTE — Telephone Encounter (Signed)
I have called pt no answer, and her VM box was full. I was unable to leave a VM for a call back. I will try to call pt again

## 2019-11-20 NOTE — Progress Notes (Addendum)
PROGRESS NOTE    [name]  OAC:[id number] DOB: 16-Apr-1946 DOA: 11/10/2019 PCP: [name], MD   Brief Narrative:  74 year old with history of CVA, PAD, meningioma, hypothyroidism, HTN, GERD, depression, bipolar disorder, anemia admitted to the ICU secondary to sepsis from left-sided pyelonephritis, hypoxia.  CT of the abdomen was negative for any acute pathology, CTA chest on 3/8 showed patchy bilateral groundglass opacity.  Had Enterobacter bacteremia, treated with vancomycin cefepime and Rocephin.  Bronchoscopy was performed 3/8 and thus far most of the work-up has been negative.  Hospital course complicated by acute kidney injury, nephrology team following.   Assessment & Plan:   Principal Problem:   Complicated UTI (urinary tract infection) Active Problems:   HTN (hypertension)   AKI (acute kidney injury) ([place])   Hyponatremia   Elevated LFTs   Sepsis ([place])   Bilateral pulmonary infiltrates on CXR   Acute hypoxemic respiratory failure (HCC)  Acute kidney injury secondary to ATN, creatinine today 0.10 Metabolic acidosis -Suspect this was exacerbated by septic shock/pyelonephritis with bacteremia.  Nephrology team following, monitor urine output and renal function. -Continue sodium bicarb -Most of the vasculitis work-up-negative -Renal ultrasound-no hydronephrosis  Sepsis secondary to urinary tract infection, E. coli bacteremia, POA -Initially on Rocephin transition to vancomycin and cefepime on 3/8. -MRSA negative, vancomycin now discontinued -Procalcitonin-0.86  Acute hypoxic respiratory failure -Residential concerns of renal pulmonary syndrome/vasculitis but low suspicion at this time.  Continue Solu-Medrol 60 mg every 12 hours-transition to prednisone, bronchodilators.  Incentive spirometer and flutter valve -Elevated BNP, Lasix IV 80 mg asked to  Anemia of chronic disease -Hemoglobin 9.1.  Stable for now.  Essential hypertension -Stable  Congestive heart  failure with preserved ejection fraction -Echocardiogram-EF 70%, grade 1 DD  History of peripheral vascular disease with thromboembolism -On Xarelto  History of COPD -Bronchodilators  GERD -Pepcid  Restless leg syndrome -On ropinirole  Bipolar disorder -Lamictal and Cymbalta  PT is recommending CIR, CONSULTED cir.  Nutrition team following  DVT prophylaxis: Xarelto Code Status: Full  Family Communication:  Spoke with [name], he is updated.  Disposition Plan:   Patient From= home  Patient Anticipated D/C place= CIR  Barriers= maintain hospital stay for IV antibiotics and IV diuretics today.  Continue to wean off oxygen as appropriate.  Hopefully CIR over next couple of days.   Subjective: Sitting in the chair, feels ok. Still has exertional dyspnea but overall in good spirits.   Review of Systems Otherwise negative except as per HPI, including: General: Denies fever, chills, night sweats or unintended weight loss. Resp: Denies cough, wheezing, shortness of breath. Cardiac: Denies chest pain, palpitations, orthopnea, paroxysmal nocturnal dyspnea. GI: Denies abdominal pain, nausea, vomiting, diarrhea or constipation GU: Denies dysuria, frequency, hesitancy or incontinence MS: Denies muscle aches, joint pain or swelling Neuro: Denies headache, neurologic deficits (focal weakness, numbness, tingling), abnormal gait Psych: Denies anxiety, depression, SI/HI/AVH Skin: Denies new rashes or lesions ID: Denies sick contacts, exotic exposures, travel  Examination:  General exam: Appears calm and comfortable, sitting up in the chair Respiratory system: Bibasilar crackles Cardiovascular system: S1 & S2 heard, RRR. No JVD, murmurs, rubs, gallops or clicks. No pedal edema. Gastrointestinal system: Abdomen is nondistended, soft and nontender. No organomegaly or masses felt. Normal bowel sounds heard. Central nervous system: Alert and oriented. No focal neurological  deficits. Extremities: Symmetric 5 x 5 power. Skin: No rashes, lesions or ulcers Psychiatry: Judgement and insight appear normal. Mood & affect appropriate.     Objective: Vitals:  11/19/19 1713 11/19/19 2020 11/20/19 0031 11/20/19 0737  BP: 133/68 (!) 148/77 125/61   Pulse: 81 81 (!) 25   Resp: _0 Temp: 97.9 F (36.6 C) 97.9 F (36.6 C) 98.7 F (37.1 C)   TempSrc: Oral Oral Oral   SpO2: 95% 96% (!) 89% 95%  Weight:      Height:        Intake/Output Summary (Last 24 hours) at 11/20/2019 0756 Last data filed at 11/19/2019 1600 Gross per 24 hour  Intake 914.55 ml  Output 2400 ml  Net -1485.45 ml   Filed Weights   11/11/19 0010  Weight: 71.7 kg     Data Reviewed:   CBC: Recent Labs  Lab 11/16/19 0246 11/16/19 0246 11/17/19 0159 11/17/19 0159 11/17/19 1812 11/17/19 2059 11/18/19 0515 11/19/19 0548 11/20/19 0346  WBC 15.6*  --  21.3*  --   --   --  15.5* 20.3* 16.2*  NEUTROABS 12.1*  --  17.5*  --   --   --  14.3* 18.0* 14.2*  HGB 11.2*   < > 10.7*   < > 11.2* 10.2* 9.4* 9.4* 9.1*  HCT 32.2*   < > 30.6*   < > 33.0* 30.0* 27.5* 27.1* 26.3*  MCV 83.9  --  83.2  --   --   --  84.4 84.2 85.4  PLT 115*  --  157  --   --   --  188 280 301   < > = values in this interval not displayed.   Basic Metabolic Panel: Recent Labs  Lab 11/17/19 0159 11/17/19 1812 11/17/19 2059 11/17/19 2145 11/18/19 0515 11/19/19 0548 11/20/19 0346  NA 131*   < > 136 132* 135 135 136  K 3.4*   < > 3.6 3.5 3.6 3.5 3.2*  CL 103  --   --  103 104 104 101  CO2 17*  --   --  16* 17* 18* 21*  GLUCOSE 137*  --   --  165* 196* 173* 176*  BUN 44*  --   --  45* 50* 60* 63*  CREATININE 2.61*  --   --  2.74* 3.03* 2.50* 2.01*  CALCIUM 7.6*  --   --  7.5* 8.0* 8.1* 7.9*  MG  --   --   --  1.3*  --   --  1.8  PHOS  --   --   --   --   --   --  4.0   < > = values in this interval not displayed.   GFR: Estimated Creatinine Clearance: 24 mL/min (A) (by C-G formula based on SCr of  2.01 mg/dL (H)). Liver Function Tests: No results for input(s): AST, ALT, ALKPHOS, BILITOT, PROT, ALBUMIN in the last 168 hours. No results for input(s): LIPASE, AMYLASE in the last 168 hours. No results for input(s): AMMONIA in the last 168 hours. Coagulation Profile: No results for input(s): INR, PROTIME in the last 168 hours. Cardiac Enzymes: No results for input(s): CKTOTAL, CKMB, CKMBINDEX, TROPONINI in the last 168 hours. BNP (last 3 results) Recent Labs    03/10/19 1438  PROBNP 263   HbA1C: No results for input(s): HGBA1C in the last 72 hours. CBG: No results for input(s): GLUCAP in the last 168 hours. Lipid Profile: Recent Labs    11/17/19 2145  TRIG 116   Thyroid Function Tests: No results for input(s): TSH, T4TOTAL, FREET4, T3FREE, THYROIDAB in the last 72 hours. Anemia Panel: Recent  Labs    11/19/19 0548  FERRITIN 500*  TIBC 232*  IRON 50   Sepsis Labs: Recent Labs  Lab 11/17/19 0159  PROCALCITON 2.80    Recent Results (from the past 240 hour(s))  Culture, blood (Routine x 2)     Status: Abnormal   Collection Time: 11/10/19  4:53 PM   Specimen: BLOOD  Result Value Ref Range Status   Specimen Description BLOOD RIGHT ANTECUBITAL  Final   Special Requests   Final    BOTTLES DRAWN AEROBIC AND ANAEROBIC Blood Culture results may not be optimal due to an excessive volume of blood received in culture bottles   Culture  Setup Time   Final    GRAM NEGATIVE RODS IN BOTH AEROBIC AND ANAEROBIC BOTTLES CRITICAL RESULT CALLED TO, READ BACK BY AND VERIFIED WITH: Lynnea Maizes  PharmD 9:15 11/11/19 ([email or web address])    Culture (A)  Final    ESCHERICHIA COLI SEE SEPARATE REPORT FOR SUSCEPTIBILITY RESULTS Performed at [facility] Performed at [facility], [place] [address], [place], [place] [address]    Report Status 11/18/2019 FINAL  Final  Blood Culture ID Panel (Reflexed)     Status: Abnormal   Collection Time: 11/10/19  4:53 PM  Result Value Ref Range  Status   Enterococcus species NOT DETECTED NOT DETECTED Final   Listeria monocytogenes NOT DETECTED NOT DETECTED Final   Staphylococcus species NOT DETECTED NOT DETECTED Final   Staphylococcus aureus (BCID) NOT DETECTED NOT DETECTED Final   Streptococcus species NOT DETECTED NOT DETECTED Final   Streptococcus agalactiae NOT DETECTED NOT DETECTED Final   Streptococcus pneumoniae NOT DETECTED NOT DETECTED Final   Streptococcus pyogenes NOT DETECTED NOT DETECTED Final   Acinetobacter baumannii NOT DETECTED NOT DETECTED Final   Enterobacteriaceae species DETECTED (A) NOT DETECTED Final    Comment: Enterobacteriaceae represent a large family of gram-negative bacteria, not a single organism. CRITICAL RESULT CALLED TO, READ BACK BY AND VERIFIED WITH: Lynnea Maizes PharmD 9:15 11/11/19 ([email or web address])    Enterobacter cloacae complex NOT DETECTED NOT DETECTED Final   Escherichia coli DETECTED (A) NOT DETECTED Final    Comment: CRITICAL RESULT CALLED TO, READ BACK BY AND VERIFIED WITH: Lynnea Maizes PharmD 9:15 11/11/19 ([email or web address])    Klebsiella oxytoca NOT DETECTED NOT DETECTED Final   Klebsiella pneumoniae NOT DETECTED NOT DETECTED Final   Proteus species NOT DETECTED NOT DETECTED Final   Serratia marcescens NOT DETECTED NOT DETECTED Final   Carbapenem resistance NOT DETECTED NOT DETECTED Final   Haemophilus influenzae NOT DETECTED NOT DETECTED Final   Neisseria meningitidis NOT DETECTED NOT DETECTED Final   Pseudomonas aeruginosa NOT DETECTED NOT DETECTED Final   Candida albicans NOT DETECTED NOT DETECTED Final   Candida glabrata NOT DETECTED NOT DETECTED Final   Candida krusei NOT DETECTED NOT DETECTED Final   Candida parapsilosis NOT DETECTED NOT DETECTED Final   Candida tropicalis NOT DETECTED NOT DETECTED Final    Comment: Performed at [facility], [place] [address] [place], [place] [address]  Susceptibility, Aer + Anaerob     Status: Abnormal   Collection Time: 11/10/19  4:53 PM  Result Value  Ref Range Status   Suscept, Aer + Anaerob Final report (A)  Corrected    Comment: (NOTE) Performed At: [facility] [address] [place], [place] [id number] Rush Farmer MD LT:[id number] CORRECTED ON 03/07 AT 1935: PREVIOUSLY REPORTED AS Preliminary report    Source of Sample ECOLI SUSCEPTIBILITY/ SOURCE BLOOD  Final    Comment:  Performed at [facility], [place] [address], [place], [place] [address]  Susceptibility Result     Status: Abnormal   Collection Time: 11/10/19  4:53 PM  Result Value Ref Range Status   Suscept Result 1 Escherichia coli (A)  Final    Comment: (NOTE) Identification performed by account, not confirmed by this laboratory.    Antimicrobial Suscept Comment  Corrected    Comment: (NOTE)      ** S = Susceptible; I = Intermediate; R = Resistant **                   P = Positive; N = Negative            MICS are expressed in micrograms per mL   Antibiotic                 RSLT#1    RSLT#2    RSLT#3    RSLT#4 Amoxicillin/Clavulanic Acid    S Ampicillin                     I Cefepime                       S Ceftriaxone                    S Cefuroxime                     S Ciprofloxacin                  S Ertapenem                      S Gentamicin                     S Imipenem                       S Levofloxacin                   S Meropenem                      S Nitrofurantoin                 S Piperacillin/Tazobactam        S Tetracycline                   S Tobramycin                     S Trimethoprim/Sulfa             S Performed At: [facility] [place], [place] [id number] Rush Farmer MD CL:[id number]   SARS CORONAVIRUS 2 (TAT 6-24 HRS) Nasopharyngeal Nasopharyngeal Swab     Status: None   Collection Time: 11/11/19 12:27 AM   Specimen: Nasopharyngeal Swab  Result Value Ref Range Status   SARS Coronavirus 2 NEGATIVE NEGATIVE Final    Comment: (NOTE) SARS-CoV-2 target nucleic acids are NOT DETECTED. The  SARS-CoV-2 RNA is generally detectable in upper and lower respiratory specimens during the acute phase of infection. Negative results do not preclude SARS-CoV-2 infection, do not rule out co-infections with other pathogens, and should not be used as the sole basis for treatment or other patient management decisions. Negative results must be combined with clinical observations,  patient history, and epidemiological information. The expected result is Negative. Fact Sheet for Patients: SugarRoll.be Fact Sheet for Healthcare Providers: https://www.woods-mathews.com/ This test is not yet approved or cleared by the [place] FDA and  has been authorized for detection and/or diagnosis of SARS-CoV-2 by FDA under an Emergency Use Authorization (EUA). This EUA will remain  in effect (meaning this test can be used) for the duration of the COVID-19 declaration under Section 56 4(b)(1) of the Act, 21 U.S.C. section 360bbb-3(b)(1), unless the authorization is terminated or revoked sooner. Performed at [facility], [place] [address], [place], [place] [address]   Culture, blood (routine x 2)     Status: None (Preliminary result)   Collection Time: 11/16/19 12:13 PM   Specimen: BLOOD  Result Value Ref Range Status   Specimen Description BLOOD LEFT ANTECUBITAL  Final   Special Requests   Final    BOTTLES DRAWN AEROBIC ONLY Blood Culture results may not be optimal due to an inadequate volume of blood received in culture bottles   Culture   Final    NO GROWTH 3 DAYS Performed at [facility], [place] [address], [place], [place] [address]    Report Status PENDING  Incomplete  Culture, blood (routine x 2)     Status: None (Preliminary result)   Collection Time: 11/16/19 12:13 PM   Specimen: BLOOD  Result Value Ref Range Status   Specimen Description BLOOD LEFT ANTECUBITAL  Final   Special Requests   Final    BOTTLES DRAWN AEROBIC ONLY Blood Culture  results may not be optimal due to an inadequate volume of blood received in culture bottles   Culture   Final    NO GROWTH 3 DAYS Performed at [facility], [place] [address] [place], [place] [address]    Report Status PENDING  Incomplete  Culture, Urine     Status: Abnormal   Collection Time: 11/16/19  1:59 PM   Specimen: Urine, Clean Catch  Result Value Ref Range Status   Specimen Description URINE, CLEAN CATCH  Final   Special Requests   Final    NONE Performed at [facility], [place] [address] [place], [place] [address]    Culture MULTIPLE SPECIES PRESENT, SUGGEST RECOLLECTION (A)  Final   Report Status 11/18/2019 FINAL  Final  Acid Fast Smear (AFB)     Status: None   Collection Time: 11/17/19  5:57 PM   Specimen: Bronchial Alveolar Lavage  Result Value Ref Range Status   AFB Specimen Processing Concentration  Final   Acid Fast Smear Negative  Final    Comment: (NOTE) Performed At: [facility] [place], [place] [id number] Rush Farmer MD DX:[id number]    Source (AFB) BRONCHIAL ALVEOLAR LAVAGE  Final    Comment: Performed at [facility], [place] [address], [place], [place] [address]  Culture, respiratory     Status: None   Collection Time: 11/17/19  5:57 PM   Specimen: Bronchoalveolar Lavage; Respiratory  Result Value Ref Range Status   Specimen Description BRONCHIAL ALVEOLAR LAVAGE  Final   Special Requests NONE  Final   Gram Stain   Final    FEW WBC PRESENT,BOTH PMN AND MONONUCLEAR NO ORGANISMS SEEN    Culture   Final    Consistent with normal respiratory flora. Performed at [facility], [place] [address] [place], [place] [address]    Report Status 11/19/2019 FINAL  Final  Pneumocystis smear by  DFA     Status: None   Collection Time: 11/17/19  5:57 PM   Specimen: Bronchoalveolar Lavage; Respiratory  Result Value Ref Range Status   Specimen Source-PJSRC BRONCHIAL ALVEOLAR LAVAGE  Final   Pneumocystis jiroveci Ag NEGATIVE   Final    Comment: Performed at [facility] Performed at [facility], [address] [place], [place] [address]   MRSA PCR Screening     Status: None   Collection Time: 11/18/19  1:12 PM   Specimen: Nasal Mucosa; Nasopharyngeal  Result Value Ref Range Status   MRSA by PCR NEGATIVE NEGATIVE Final    Comment:        The GeneXpert MRSA Assay (FDA approved for NASAL specimens only), is one component of a comprehensive MRSA colonization surveillance program. It is not intended to diagnose MRSA infection nor to guide or monitor treatment for MRSA infections. Performed at [facility], [place] [address], [place], [place] [address]          Radiology Studies: US RENAL  Result Date: 11/18/2019 CLINICAL DATA:  Renal failure. EXAM: RENAL / URINARY TRACT ULTRASOUND COMPLETE COMPARISON:  CT abdomen and pelvis of 11/11/2019 FINDINGS: Right Kidney: Renal measurements: 12 x 4.8 x 6.1 cm = volume: 183 mL . Echogenicity within normal limits. No mass or hydronephrosis visualized. Left Kidney: Renal measurements: 14 x 5.4 x 7.1 cm = volume: 280 mL. Echogenicity within normal limits. No mass or hydronephrosis visualized. Trace perinephric fluid/edema seen on the left. Bladder: Foley catheter within a decompressed urinary bladder. Other: Signs of small to moderate left pleural effusion incidentally noted. Also with small gallstone in the gallbladder. IMPRESSION: 1. No signs of hydronephrosis. Trace perinephric fluid on the left without focal, organized collection. 2. Foley catheter in decompressed urinary bladder. 3. Small to moderate left effusion. 4. Cholelithiasis. Electronically Signed   By: Zetta Bills M.D.   On: 11/18/2019 09:49   DG Chest Port 1 View  Result Date: 11/20/2019 CLINICAL DATA:  Acute respiratory failure EXAM: PORTABLE CHEST 1 VIEW COMPARISON:  Yesterday FINDINGS: Interstitial and airspace opacity which is diffuse and stable to mildly increased. Borderline heart  size is stable. No visible effusion or pneumothorax. IMPRESSION: Stable or mildly increased generalized airspace disease. Electronically Signed   By: Monte Fantasia M.D.   On: 11/20/2019 07:43   DG CHEST PORT 1 VIEW  Result Date: 11/19/2019 CLINICAL DATA:  Sepsis. EXAM: PORTABLE CHEST 1 VIEW COMPARISON:  11/18/2019 FINDINGS: The endotracheal tube has been removed. Persistent diffuse interstitial and airspace process in the lungs. No pleural effusions. No pneumothorax. IMPRESSION: Removal of ET tube. Persistent diffuse interstitial and airspace process. Electronically Signed   By: Marijo Sanes M.D.   On: 11/19/2019 08:26   ECHOCARDIOGRAM COMPLETE  Result Date: 11/18/2019    ECHOCARDIOGRAM REPORT   Patient Name:   [name] Date of Exam: 11/18/2019 Medical Rec #:  [id number]      Height:       68.0 in Accession #:    [id number]     Weight:       158.1 lb Date of Birth:  [birth year]       BSA:          1.849 m Patient Age:    89 years       BP:           95/57 mmHg Patient Gender: F              HR:  79 bpm. Exam Location:  Inpatient Procedure: 2D Echo Indications:    Chest Pain R07.9  History:        Patient has prior history of Echocardiogram examinations, most                 recent 10/25/2017. CAD; Risk Factors:Hypertension.  Sonographer:    Mikki Santee RDCS (AE) Referring Phys: [id number] [place]  Sonographer Comments: Echo performed with patient supine and on artificial respirator. IMPRESSIONS  1. Left ventricular ejection fraction, by estimation, is 70 to 75%. The left ventricle has hyperdynamic function. The left ventricle has no regional wall motion abnormalities. There is mild concentric left ventricular hypertrophy. Left ventricular diastolic parameters are consistent with Grade I diastolic dysfunction (impaired relaxation). Elevated left atrial pressure.  2. Right ventricular systolic function is normal. The right ventricular size is normal. There is moderately elevated  pulmonary artery systolic pressure.  3. The mitral valve is normal in structure. No evidence of mitral valve regurgitation. No evidence of mitral stenosis.  4. Aortic valve gradients are increased due to increased cardiac output (consdier anemia, sepsis, thyrotoxicosis, etc.). The aortic valve is normal in structure. Aortic valve regurgitation is trivial. Mild aortic valve sclerosis is present, with no evidence of aortic valve stenosis.  5. The inferior vena cava is dilated in size with <50% respiratory variability, suggesting right atrial pressure of 15 mmHg. Comparison(s): Prior images reviewed side by side. Changes from prior study are noted. There is evidence of high cardiac output and increased filling pressures. FINDINGS  Left Ventricle: Left ventricular ejection fraction, by estimation, is 70 to 75%. The left ventricle has hyperdynamic function. The left ventricle has no regional wall motion abnormalities. The left ventricular internal cavity size was normal in size. There is mild concentric left ventricular hypertrophy. Left ventricular diastolic parameters are consistent with Grade I diastolic dysfunction (impaired relaxation). Elevated left atrial pressure. Right Ventricle: The right ventricular size is normal. No increase in right ventricular wall thickness. Right ventricular systolic function is normal. There is moderately elevated pulmonary artery systolic pressure. The tricuspid regurgitant velocity is 2.88 m/s, and with an assumed right atrial pressure of 15 mmHg, the estimated right ventricular systolic pressure is 69.4 mmHg. Left Atrium: Left atrial size was normal in size. Right Atrium: Right atrial size was normal in size. Pericardium: There is no evidence of pericardial effusion. Mitral Valve: The mitral valve is normal in structure. Normal mobility of the mitral valve leaflets. No evidence of mitral valve regurgitation. No evidence of mitral valve stenosis. Tricuspid Valve: The tricuspid valve  is normal in structure. Tricuspid valve regurgitation is trivial. No evidence of tricuspid stenosis. Aortic Valve: Aortic valve gradients are increased due to increased cardiac output (consdier anemia, sepsis, thyrotoxicosis, etc.). The aortic valve is normal in structure. Aortic valve regurgitation is trivial. Mild aortic valve sclerosis is present, with no evidence of aortic valve stenosis. Aortic valve mean gradient measures 13.5 mmHg. Aortic valve peak gradient measures 26.8 mmHg. Aortic valve area, by VTI measures 2.07 cm. Pulmonic Valve: The pulmonic valve was normal in structure. Pulmonic valve regurgitation is not visualized. No evidence of pulmonic stenosis. Aorta: The aortic root is normal in size and structure. Venous: IVC dilation is a nonspecific finding during positive pressure ventilation. The inferior vena cava is dilated in size with less than 50% respiratory variability, suggesting right atrial pressure of 15 mmHg. IAS/Shunts: No atrial level shunt detected by color flow Doppler.  LEFT VENTRICLE PLAX 2D LVIDd:  4.13 cm  Diastology LVIDs:         2.27 cm  LV e' lateral:   7.40 cm/s LV PW:         1.39 cm  LV E/e' lateral: 14.1 LV IVS:        1.19 cm  LV e' medial:    6.64 cm/s LVOT diam:     2.00 cm  LV E/e' medial:  15.7 LV SV:         107 LV SV Index:   58 LVOT Area:     3.14 cm  RIGHT VENTRICLE RV S prime:     14.10 cm/s TAPSE (M-mode): 2.5 cm LEFT ATRIUM             Index       RIGHT ATRIUM           Index LA diam:        3.40 cm 1.84 cm/m  RA Area:     12.60 cm LA Vol (A2C):   66.6 ml 36.01 ml/m RA Volume:   23.70 ml  12.82 ml/m LA Vol (A4C):   50.5 ml 27.31 ml/m LA Biplane Vol: 59.5 ml 32.17 ml/m  AORTIC VALVE AV Area (Vmax):    2.21 cm AV Area (Vmean):   2.27 cm AV Area (VTI):     2.07 cm AV Vmax:           259.00 cm/s AV Vmean:          170.500 cm/s AV VTI:            0.517 m AV Peak Grad:      26.8 mmHg AV Mean Grad:      13.5 mmHg LVOT Vmax:         182.00 cm/s LVOT  Vmean:        123.000 cm/s LVOT VTI:          0.340 m LVOT/AV VTI ratio: 0.66  AORTA Ao Root diam: 3.20 cm MITRAL VALVE                TRICUSPID VALVE MV Area (PHT): 3.21 cm     TR Peak grad:   33.2 mmHg MV Decel Time: 236 msec     TR Vmax:        288.00 cm/s MV E velocity: 104.00 cm/s MV A velocity: 146.00 cm/s  SHUNTS MV E/A ratio:  0.71         Systemic VTI:  0.34 m                             Systemic Diam: 2.00 cm Dani Gobble Croitoru MD Electronically signed by Sanda Klein MD Signature Date/Time: 11/18/2019/3:02:21 PM    Final         Scheduled Meds: . chlorhexidine gluconate (MEDLINE KIT)  15 mL Mouth Rinse BID  . Chlorhexidine Gluconate Cloth  6 each Topical Daily  . DULoxetine  30 mg Oral QPM  . famotidine  20 mg Oral QHS  . feeding supplement (ENSURE ENLIVE)  237 mL Oral BID BM  . lamoTRIgine  75 mg Oral Daily  . linaclotide  145 mcg Oral QAC breakfast  . mouth rinse  15 mL Mouth Rinse 10 times per day  . Melatonin  18 mg Oral QHS  . methylPREDNISolone (SOLU-MEDROL) injection  60 mg Intravenous Q12H  . mometasone-formoterol  2 puff Inhalation BID  . multivitamin with minerals  1 tablet Oral  Daily  . potassium chloride  40 mEq Oral Once  . rivaroxaban  2.5 mg Oral BID  . rOPINIRole  3 mg Oral QHS  . sodium bicarbonate  650 mg Oral BID   Continuous Infusions: . sodium chloride Stopped (11/19/19 1859)  . ceFEPime (MAXIPIME) IV Stopped (11/19/19 1537)  . fentaNYL infusion INTRAVENOUS    . propofol (DIPRIVAN) infusion Stopped (11/18/19 1635)     LOS: 9 days   Time spent= 35 mins    Markiya Keefe Arsenio Loader, MD [facility]  If 7PM-7AM, please contact night-coverage  11/20/2019, 7:56 AM

## 2019-11-20 NOTE — Progress Notes (Signed)
Inpatient Rehabilitation Admissions Coordinator  Inpatient rehab consult received. I met with patient at bedside for rehab assessment. Pt previously at [facility] 10/2018 before returning home with help of her spouse and nephew from out of town. Spouse is now deceased, and she states she will have to speak with her son and nephew before committing to another CIR admit. She currently is on 9 liters HF [place] and not at a level to tolerate the intensity of an inpt rehab admit. We will follow her progress as she progresses and will have to have patient clarify caregiver support at home before proceeding with possible CIR admit. Pt asks that she be allowed to discuss with her son and nephew before I call them. We will follow.  Danne Baxter, RN, MSN Rehab Admissions Coordinator [phone] 11/20/2019 5:33 PM

## 2019-11-20 NOTE — Progress Notes (Signed)
NAME:  [name], MRN:  [id number], DOB:  1946/04/23, LOS: 9 ADMISSION DATE:  11/10/2019, CONSULTATION DATE:  11/17/2019 REFERRING MD:  Dr. Horris Latino, CHIEF COMPLAINT:  Respiratory distress   Brief History   73yo female admitted with sepsis secondary to left pyelonephritis 3/1, PCCM consulted 3/8 due to increasing oxygen demand and AMS.    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  Chest CTA 3/8  Patchy bilateral groundglass opacities, consolidation mostly in the upper lobe, nonspecific lymphadenopathy  Micro Data:  COVID 3/2 > negative  Blood culture 3/1 > Enterobacter and E.coli  Blood culture 3/7 >   Urine culture 3/7 > multiple species BAL 3/9 >   Antimicrobials:  Ceftriaxone 3/2 > 3/8 Cefepime 3/8 >  Vancomycin 3/8 > off  Interim history/subjective:  General:Resting in chair. Still feels like her chest is "heavy". Not in acute distress but her oxygen requirements are wore.   Objective   Blood pressure (Abnormal) 146/77, pulse 80, temperature 98.1 F (36.7 C), temperature source Oral, resp. rate 18, height _0  (1.727 m), weight 71.7 kg, SpO2 95 %.        Intake/Output Summary (Last 24 hours) at 11/20/2019 0820 Last data filed at 11/19/2019 1600 Gross per 24 hour  Intake 654.65 ml  Output 2400 ml  Net -1745.35 ml   Filed Weights   11/11/19 0010  Weight: 71.7 kg    Examination:  General: This is a 74 year old white female she is currently sitting up in the chair and in no acute distress however her oxygen  requirements have increased over the last 24 hours HEENT normocephalic atraumatic no jugular venous distention appreciated mucous membranes are moist Pulmonary: Diffuse scattered rales posteriorly.  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 Acute Kidney Injury -in the setting of acute illness. Baseline creatinine 0.8-0.9, creatinine on admission 1.44 Acute metabolic encephalopathy > improving-In the setting of acute bacteremia  History of PVD with possible atheroemboli  Current liquid diarrhea  History of chronic constipation   pulm problem list: Acute hypoxic respiratory failure in setting of diffuse bilateral infiltrates.  -Negative for DAH on FOB so doubt vasculitis. Does have borderline RF.  -Seem unlikely this is actual autoimmune process, favor more ALI vs or +/- UIP. Suspect element of pulmonary edema accounting for CXR and O2 requirement worsening   -PCXR w/ diffuse more patchy airspace disease. Aeration actually looks worse c/w 3/10 -echo w/ grade I  ddysfxn -BAL neg -oxygen requirements actually rising 6-->9 HFNC -her I&O balance is +8 liters  Plan Change steroids to pred 54m daily, would slowly taper this over 2 weeks IV lasix X 2 today (will increase to 80 mg x two doses) Cont to  wean FIO2 BNP now and in am Am cxr rx bacteremia and f/u blood cultures F/u ANA Repeat HRCT as outpt w/ Dr OAnder Slade  Current tobacco abuse. prob COPD Plan Cont BDs   PErick ColaceACNP-BC LHomosassa SpringsPager # [phone] # [phone] no answer

## 2019-11-21 ENCOUNTER — Inpatient Hospital Stay (HOSPITAL_COMMUNITY): Payer: Medicare Other

## 2019-11-21 LAB — CBC
HCT: 30.1 % — ABNORMAL LOW (ref 36.0–46.0)
Hemoglobin: 10.2 g/dL — ABNORMAL LOW (ref 12.0–15.0)
MCH: 28.8 pg (ref 26.0–34.0)
MCHC: 33.9 g/dL (ref 30.0–36.0)
MCV: 85 fL (ref 80.0–100.0)
Platelets: 321 10*3/uL (ref 150–400)
RBC: 3.54 MIL/uL — ABNORMAL LOW (ref 3.87–5.11)
RDW: 14.9 % (ref 11.5–15.5)
WBC: 15.6 10*3/uL — ABNORMAL HIGH (ref 4.0–10.5)
nRBC: 0 % (ref 0.0–0.2)

## 2019-11-21 LAB — BASIC METABOLIC PANEL
Anion gap: 13 (ref 5–15)
BUN: 53 mg/dL — ABNORMAL HIGH (ref 8–23)
CO2: 27 mmol/L (ref 22–32)
Calcium: 7.8 mg/dL — ABNORMAL LOW (ref 8.9–10.3)
Chloride: 94 mmol/L — ABNORMAL LOW (ref 98–111)
Creatinine, Ser: 1.4 mg/dL — ABNORMAL HIGH (ref 0.44–1.00)
GFR calc Af Amer: 42 mL/min — ABNORMAL LOW (ref >60)
GFR calc non Af Amer: 36 mL/min — ABNORMAL LOW (ref >60)
Glucose, Bld: 162 mg/dL — ABNORMAL HIGH (ref 70–99)
Potassium: 3.4 mmol/L — ABNORMAL LOW (ref 3.5–5.1)
Sodium: 134 mmol/L — ABNORMAL LOW (ref 135–145)

## 2019-11-21 LAB — COMPREHENSIVE METABOLIC PANEL
ALT: 28 U/L (ref 0–44)
AST: 22 U/L (ref 15–41)
Albumin: 2.3 g/dL — ABNORMAL LOW (ref 3.5–5.0)
Alkaline Phosphatase: 129 U/L — ABNORMAL HIGH (ref 38–126)
Anion gap: 17 — ABNORMAL HIGH (ref 5–15)
BUN: 60 mg/dL — ABNORMAL HIGH (ref 8–23)
CO2: 22 mmol/L (ref 22–32)
Calcium: 7.8 mg/dL — ABNORMAL LOW (ref 8.9–10.3)
Chloride: 99 mmol/L (ref 98–111)
Creatinine, Ser: 1.55 mg/dL — ABNORMAL HIGH (ref 0.44–1.00)
GFR calc Af Amer: 37 mL/min — ABNORMAL LOW (ref >60)
GFR calc non Af Amer: 32 mL/min — ABNORMAL LOW (ref >60)
Glucose, Bld: 131 mg/dL — ABNORMAL HIGH (ref 70–99)
Potassium: 3.3 mmol/L — ABNORMAL LOW (ref 3.5–5.1)
Sodium: 138 mmol/L (ref 135–145)
Total Bilirubin: 1.2 mg/dL (ref 0.3–1.2)
Total Protein: 6.1 g/dL — ABNORMAL LOW (ref 6.5–8.1)

## 2019-11-21 LAB — CULTURE, BLOOD (ROUTINE X 2)
Culture: NO GROWTH
Culture: NO GROWTH

## 2019-11-21 LAB — ANTINUCLEAR ANTIBODIES, IFA: ANA Ab, IFA: NEGATIVE

## 2019-11-21 LAB — MAGNESIUM: Magnesium: 1.6 mg/dL — ABNORMAL LOW (ref 1.7–2.4)

## 2019-11-21 MED ORDER — SODIUM CHLORIDE 0.9 % IV SOLN
2.0000 g | Freq: Two times a day (BID) | INTRAVENOUS | Status: DC
  Administered 2019-11-21 – 2019-11-22 (×2): 2 g via INTRAVENOUS
  Filled 2019-11-21 (×3): qty 2

## 2019-11-21 MED ORDER — FUROSEMIDE 10 MG/ML IJ SOLN
40.0000 mg | Freq: Two times a day (BID) | INTRAMUSCULAR | Status: DC
  Administered 2019-11-21 – 2019-11-22 (×4): 40 mg via INTRAVENOUS
  Filled 2019-11-21 (×4): qty 4

## 2019-11-21 MED ORDER — MAGNESIUM SULFATE 2 GM/50ML IV SOLN
2.0000 g | Freq: Once | INTRAVENOUS | Status: AC
  Administered 2019-11-21: 2 g via INTRAVENOUS
  Filled 2019-11-21: qty 50

## 2019-11-21 MED ORDER — POTASSIUM CHLORIDE CRYS ER 20 MEQ PO TBCR
40.0000 meq | EXTENDED_RELEASE_TABLET | ORAL | Status: AC
  Administered 2019-11-21 (×3): 40 meq via ORAL
  Filled 2019-11-21 (×3): qty 2

## 2019-11-21 MED ORDER — SODIUM CHLORIDE 0.9% FLUSH: 10.0000 mL | INTRAVENOUS | Status: DC | PRN

## 2019-11-21 NOTE — Progress Notes (Signed)
Patient at this time states she does not want to be stuck for another PIV. She states she is tired of being stuck and tired of all of this ad she just doesn't want any more IV's

## 2019-11-21 NOTE — Progress Notes (Signed)
Physical Therapy Treatment Patient Details Name: [name] MRN: [id number] DOB: 23-Jul-1946 Today's Date: 11/21/2019    History of Present Illness [name] is a 74yo female with a PMH significant for prior CVA, PAD, meningioma, hypothyroidism, hypertension, GERD, depression, bipolar disease, and anemia who presented to the ED on 3/1 with complaints of dysuria, urinary frequency, suprapubic abdominal pain, left-sided flank pain, and chills that began 1 week prior to admission. Pt with diagnosis of complicated UTI, with increasing AMS. Ct chest revealed ground glass opacities and mild cardiomegaly concerning for flash pulmonary edema, vs PNA, vs diffuse alveolar hemorrhage. ETT 3/8-3/9.    PT Comments    Pt resting on 4L of supp O2. Resting SpO2 is 89-91%. BP WNL today. Pt reports being too weak to move however willing to work with therapies. Pt demonstrates improved functional mobility today however is impulsive and requires encouragement. Pt transfers supine to sit with supervision, stands at [facility] with min A and stand pivots with min A for steadiness and sequencing. Pt desats to 84% briefly upon exertion however quickly rises back into the high 80s with pursed lip breathing cues. D/C plans remain appropriate. PT will continue to follow pt acutely.   Follow Up Recommendations  CIR     Equipment Recommendations  Other (comment)(defer)       Precautions / Restrictions Precautions Precautions: Fall Restrictions Weight Bearing Restrictions: No    Mobility  Bed Mobility Overal bed mobility: Needs Assistance Bed Mobility: Supine to Sit     Supine to sit: Supervision;HOB elevated     General bed mobility comments: supervision for hx of impulsive behavior and safety  Transfers Overall transfer level: Needs assistance Equipment used: Rolling walker (2 wheeled) Transfers: Sit to/from [facility] Sit to Stand: Min assist;+2 safety/equipment Stand pivot transfers:  +2 safety/equipment;Min assist       General transfer comment: Min assist for safety and steadiness. Pt demonstrated good initiation and ability to take a few steps over to the chair.   Ambulation/Gait Ambulation/Gait assistance: Min assist;+2 safety/equipment Gait Distance (Feet): 3 Feet Assistive device: Rolling walker (2 wheeled) Gait Pattern/deviations: Step-to pattern;Decreased step length - right;Decreased step length - left;Shuffle;Narrow base of support Gait velocity: decreased   General Gait Details: Limited gait due to desaturation and pt willingness; pt took a few short steps over to the chair with min A for steadying.         Balance Overall balance assessment: Needs assistance Sitting-balance support: Feet supported Sitting balance-Leahy Scale: Fair Sitting balance - Comments: able to sit EOB without UE support however tends to sit with her forearms in her lap hunched over. Cuing for posture Postural control: Other (comment)(forward flexion) Standing balance support: Bilateral upper extremity supported Standing balance-Leahy Scale: Poor Standing balance comment: reliant on UE support, external assist                            Cognition Arousal/Alertness: Awake/alert Behavior During Therapy: Impulsive;WFL for tasks assessed/performed Overall Cognitive Status: Impaired/Different from baseline Area of Impairment: Attention;Memory;Following commands;Safety/judgement;Awareness;Problem solving                   Current Attention Level: Selective Memory: Decreased recall of precautions;Decreased short-term memory Following Commands: Follows one step commands with increased time Safety/Judgement: Decreased awareness of safety;Decreased awareness of deficits Awareness: Emergent Problem Solving: Slow processing;Requires verbal cues;Difficulty sequencing General Comments: Patient spoke tangentially often throughout session.  Also an instance of crying.  She appeared depressed and mentioned wanting to be done living multiple times.       Exercises Other Exercises Other Exercises: pursed lip breathing Other Exercises: IS x 10. Max inspiration 560mL.    General Comments General comments (skin integrity, edema, etc.): Pt is on 4L of supp O2. Resting SpO2 is 89-91%. On exertion, O2 briefly drops to 84% however increases into high 80s with pursed lip breathing.      Pertinent Vitals/Pain Pain Assessment: No/denies pain           PT Goals (current goals can now be found in the care plan section) Acute Rehab PT Goals Patient Stated Goal: go home and take a shower PT Goal Formulation: With patient Time For Goal Achievement: 12/03/19 Potential to Achieve Goals: Good Progress towards PT goals: Progressing toward goals    Frequency    Min 3X/week      PT Plan Current plan remains appropriate    Co-evaluation PT/OT/SLP Co-Evaluation/Treatment: Yes Reason for Co-Treatment: For patient/therapist safety;To address functional/ADL transfers;Necessary to address cognition/behavior during functional activity PT goals addressed during session: Mobility/safety with mobility OT goals addressed during session: ADL's and self-care      AM-PAC PT "6 Clicks" Mobility   Outcome Measure  Help needed turning from your back to your side while in a flat bed without using bedrails?: A Little Help needed moving from lying on your back to sitting on the side of a flat bed without using bedrails?: A Little Help needed moving to and from a bed to a chair (including a wheelchair)?: A Little Help needed standing up from a chair using your arms (e.g., wheelchair or bedside chair)?: A Little Help needed to walk in hospital room?: A Lot Help needed climbing 3-5 steps with a railing? : Total 6 Click Score: 15    End of Session Equipment Utilized During Treatment: Oxygen;Gait belt Activity Tolerance: Patient limited by fatigue;Other (comment)(limited  due to desaturation) Patient left: in chair;with chair alarm set;with call bell/phone within reach Nurse Communication: Mobility status PT Visit Diagnosis: Unsteadiness on feet (R26.81);Muscle weakness (generalized) (M62.81);Difficulty in walking, not elsewhere classified (R26.2)     Time: [id number] PT Time Calculation (min) (ACUTE ONLY): 25 min  Charges:  $Therapeutic Activity: 8-22 mins                     Jodelle Green, PT, DPT Acute Rehabilitation Services Office [phone]    Jodelle Green 11/21/2019, 3:22 PM

## 2019-11-21 NOTE — Progress Notes (Signed)
PROGRESS NOTE    [name]  TTS:[id number] DOB: 04-Apr-1946 DOA: 11/10/2019 PCP: [name], MD   Brief Narrative:  74 year old with history of CVA, PAD, meningioma, hypothyroidism, HTN, GERD, depression, bipolar disorder, anemia admitted to the ICU secondary to sepsis from left-sided pyelonephritis, hypoxia.  CT of the abdomen was negative for any acute pathology, CTA chest on 3/8 showed patchy bilateral groundglass opacity.  Had Enterobacter bacteremia, treated with vancomycin cefepime and Rocephin.  Bronchoscopy was performed 3/8 and thus far most of the work-up has been negative.  Hospital course complicated by acute kidney injury, nephrology team following.  For worsening hypoxia there is concerns of positive fluid balance during the hospitalization therefore getting diuretics.   Assessment & Plan:   Principal Problem:   Complicated UTI (urinary tract infection) Active Problems:   HTN (hypertension)   AKI (acute kidney injury) ([place])   Hyponatremia   Elevated LFTs   Sepsis ([place])   Bilateral pulmonary infiltrates on CXR   Acute hypoxemic respiratory failure (HCC)  Acute kidney injury secondary to ATN, creatinine today 0.92 Metabolic acidosis -Suspect this was exacerbated by septic shock/pyelonephritis with bacteremia.  Nephrology team following, monitor urine output and renal function. -Continue sodium bicarb, levels approaching normal.  Should be able to discontinue soon -Most of the vasculitis work-up-negative -Renal ultrasound-no hydronephrosis  Midline ordered due to lack of IV access.   Sepsis secondary to urinary tract infection, E. coli bacteremia, POA -On cefepime since 3/8. -Procalcitonin-0.86  Acute hypoxic respiratory failure -Residential concerns of renal pulmonary syndrome/vasculitis but low suspicion at this time.  Daily p.o. prednisone, bronchodilators.  Incentive spirometer and flutter valve -Lasix 40 mg IV twice daily continue to watch renal  function, hemodynamics and electrolytes daily.  Hypokalemia/hypomagnesemia -Aggressive repletion  Anemia of chronic disease -Hemoglobin 10.2.  Stable for now.  Essential hypertension -Stable  Congestive heart failure with preserved ejection fraction -Echocardiogram-EF 70%, grade 1 DD  History of peripheral vascular disease with thromboembolism -On Xarelto  History of COPD -Bronchodilators  GERD -Pepcid  Restless leg syndrome -On ropinirole  Bipolar disorder -Lamictal and Cymbalta  PT is recommending CIR, CONSULTED cir.  Nutrition team following  DVT prophylaxis: Xarelto Code Status: Full  Family Communication:  Son In [place], [place] updated.  Disposition Plan:   Patient From= home  Patient Anticipated D/C place= CIR  Barriers= still on 9 L high flow nasal cannula requiring IV antibiotics and diuretics.  Currently will not able to tolerate intensive rehabilitation until oxygen levels have improved.  Maintain hospital stay for now   Subjective: Feels okay still has dyspnea with movement.  Refusing IV line and multiple sticks but okay with Midline.   Review of Systems Otherwise negative except as per HPI, including: General = no fevers, chills, dizziness,  fatigue HEENT/EYES = negative for loss of vision, double vision, blurred vision,  sore throa Cardiovascular= negative for chest pain, palpitation Respiratory/lungs= negative for cough, wheezing; hemoptysis,  Gastrointestinal= negative for nausea, vomiting, abdominal pain Genitourinary= negative for Dysuria MSK = Negative for arthralgia, myalgias Neurology= Negative for headache, numbness, tingling  Psychiatry= Negative for suicidal and homocidal ideation Skin= Negative for Rash  Examination:  Constitutional: Not in acute distress, 8L [place], chronically Ill appearing.  Respiratory: b/l bibasilarrhonchi Cardiovascular: Normal sinus rhythm, no rubs Abdomen: Nontender nondistended good bowel sounds Musculoskeletal:  No edema noted Skin: No rashes seen Neurologic: CN 2-12 grossly intact.  And nonfocal Psychiatric: Normal judgment and insight. Alert and oriented x 3. Normal mood.  Objective: Vitals:   11/20/19 1945 11/20/19 1950 11/21/19 0007 11/21/19 0348  BP: (!) 153/90   (!) 156/62  Pulse: 86 88 77 85  Resp: _0 Temp: 98.8 F (37.1 C)  98.6 F (37 C) 97.7 F (36.5 C)  TempSrc: Oral  Oral Axillary  SpO2: 99% 96% 97% 92%  Weight:      Height:        Intake/Output Summary (Last 24 hours) at 11/21/2019 0754 Last data filed at 11/21/2019 0700 Gross per 24 hour  Intake 480 ml  Output 2000 ml  Net -1520 ml   Filed Weights   11/11/19 0010  Weight: 71.7 kg     Data Reviewed:   CBC: Recent Labs  Lab 11/16/19 0246 11/16/19 0246 11/17/19 0159 11/17/19 1812 11/17/19 2059 11/18/19 0515 11/19/19 0548 11/20/19 0346 11/21/19 0353  WBC 15.6*   < > 21.3*  --   --  15.5* 20.3* 16.2* 15.6*  NEUTROABS 12.1*  --  17.5*  --   --  14.3* 18.0* 14.2*  --   HGB 11.2*   < > 10.7*   < > 10.2* 9.4* 9.4* 9.1* 10.2*  HCT 32.2*   < > 30.6*   < > 30.0* 27.5* 27.1* 26.3* 30.1*  MCV 83.9   < > 83.2  --   --  84.4 84.2 85.4 85.0  PLT 115*   < > 157  --   --  188 280 301 321   < > = values in this interval not displayed.   Basic Metabolic Panel: Recent Labs  Lab 11/17/19 2145 11/18/19 0515 11/19/19 0548 11/20/19 0346 11/20/19 1203 11/20/19 1651 11/21/19 0353  NA 132*   < > 135 136 136 137 138  K 3.5   < > 3.5 3.2* 3.9 3.3* 3.3*  CL 103   < > 104 101 102 99 99  CO2 16*   < > 18* 21* 20* 23 22  GLUCOSE 165*   < > 173* 176* 182* 222* 131*  BUN 45*   < > 60* 63* 63* 65* 60*  CREATININE 2.74*   < > 2.50* 2.01* 1.87* 2.08* 1.55*  CALCIUM 7.5*   < > 8.1* 7.9* 7.9* 7.8* 7.8*  MG 1.3*  --   --  1.8  --   --  1.6*  PHOS  --   --   --  4.0  --   --   --    < > = values in this interval not displayed.   GFR: Estimated Creatinine Clearance: 31.1 mL/min (A) (by C-G formula based on SCr of  1.55 mg/dL (H)). Liver Function Tests: Recent Labs  Lab 11/21/19 0353  AST 22  ALT 28  ALKPHOS 129*  BILITOT 1.2  PROT 6.1*  ALBUMIN 2.3*   No results for input(s): LIPASE, AMYLASE in the last 168 hours. No results for input(s): AMMONIA in the last 168 hours. Coagulation Profile: No results for input(s): INR, PROTIME in the last 168 hours. Cardiac Enzymes: No results for input(s): CKTOTAL, CKMB, CKMBINDEX, TROPONINI in the last 168 hours. BNP (last 3 results) Recent Labs    03/10/19 1438  PROBNP 263   HbA1C: No results for input(s): HGBA1C in the last 72 hours. CBG: No results for input(s): GLUCAP in the last 168 hours. Lipid Profile: No results for input(s): CHOL, HDL, LDLCALC, TRIG, CHOLHDL, LDLDIRECT in the last 72 hours. Thyroid Function Tests: No results for input(s): TSH, T4TOTAL, FREET4, T3FREE, THYROIDAB in the  last 72 hours. Anemia Panel: Recent Labs    11/19/19 0548  FERRITIN 500*  TIBC 232*  IRON 50   Sepsis Labs: Recent Labs  Lab 11/17/19 0159 11/20/19 0346  PROCALCITON 2.80 0.86    Recent Results (from the past 240 hour(s))  Culture, blood (routine x 2)     Status: None   Collection Time: 11/16/19 12:13 PM   Specimen: BLOOD  Result Value Ref Range Status   Specimen Description BLOOD LEFT ANTECUBITAL  Final   Special Requests   Final    BOTTLES DRAWN AEROBIC ONLY Blood Culture results may not be optimal due to an inadequate volume of blood received in culture bottles   Culture   Final    NO GROWTH 5 DAYS Performed at [facility], [place] [address] [place], [place] [address]    Report Status 11/21/2019 FINAL  Final  Culture, blood (routine x 2)     Status: None   Collection Time: 11/16/19 12:13 PM   Specimen: BLOOD  Result Value Ref Range Status   Specimen Description BLOOD LEFT ANTECUBITAL  Final   Special Requests   Final    BOTTLES DRAWN AEROBIC ONLY Blood Culture results may not be optimal due to an inadequate volume of blood  received in culture bottles   Culture   Final    NO GROWTH 5 DAYS Performed at [facility], [place] [address] [place], [place] [address]    Report Status 11/21/2019 FINAL  Final  Culture, Urine     Status: Abnormal   Collection Time: 11/16/19  1:59 PM   Specimen: Urine, Clean Catch  Result Value Ref Range Status   Specimen Description URINE, CLEAN CATCH  Final   Special Requests   Final    NONE Performed at [facility], [place] [address], [place], [place] [address]    Culture MULTIPLE SPECIES PRESENT, SUGGEST RECOLLECTION (A)  Final   Report Status 11/18/2019 FINAL  Final  Acid Fast Smear (AFB)     Status: None   Collection Time: 11/17/19  5:57 PM   Specimen: Bronchial Alveolar Lavage  Result Value Ref Range Status   AFB Specimen Processing Concentration  Final   Acid Fast Smear Negative  Final    Comment: (NOTE) Performed At: [facility] [place], [place] [id number] Rush Farmer MD OJ:[id number]    Source (AFB) BRONCHIAL ALVEOLAR LAVAGE  Final    Comment: Performed at [facility], [place] [address] [place], [place] [address]  Culture, respiratory     Status: None   Collection Time: 11/17/19  5:57 PM   Specimen: Bronchoalveolar Lavage; Respiratory  Result Value Ref Range Status   Specimen Description BRONCHIAL ALVEOLAR LAVAGE  Final   Special Requests NONE  Final   Gram Stain   Final    FEW WBC PRESENT,BOTH PMN AND MONONUCLEAR NO ORGANISMS SEEN    Culture   Final    Consistent with normal respiratory flora. Performed at [facility], [place] [address], [place], [place] [address]    Report Status 11/19/2019 FINAL  Final  Fungus Culture With Stain     Status: None (Preliminary result)   Collection Time: 11/17/19  5:57 PM   Specimen: Bronchial Alveolar Lavage  Result Value Ref Range Status   Fungus Stain Final report  Final    Comment: (NOTE) Performed At: [facility] [place], [place]  [id number] Rush Farmer MD [id number]  Fungus (Mycology) Culture PENDING  Incomplete   Fungal Source BRONCHIAL ALVEOLAR LAVAGE  Final    Comment: Performed at [facility], [place] [address], [place], [place] [address]  Pneumocystis smear by DFA     Status: None   Collection Time: 11/17/19  5:57 PM   Specimen: Bronchoalveolar Lavage; Respiratory  Result Value Ref Range Status   Specimen Source-PJSRC BRONCHIAL ALVEOLAR LAVAGE  Final   Pneumocystis jiroveci Ag NEGATIVE  Final    Comment: Performed at [facility] Performed at [facility], [address], [place], [place] [address]   Fungus Culture Result     Status: None   Collection Time: 11/17/19  5:57 PM  Result Value Ref Range Status   Result 1 Comment  Final    Comment: (NOTE) KOH/Calcofluor preparation:  no fungus observed. Performed At: [facility] [place], [place] [id number] Rush Farmer MD [id number]   MRSA PCR Screening     Status: None   Collection Time: 11/18/19  1:12 PM   Specimen: Nasal Mucosa; Nasopharyngeal  Result Value Ref Range Status   MRSA by PCR NEGATIVE NEGATIVE Final    Comment:        The GeneXpert MRSA Assay (FDA approved for NASAL specimens only), is one component of a comprehensive MRSA colonization surveillance program. It is not intended to diagnose MRSA infection nor to guide or monitor treatment for MRSA infections. Performed at [facility], [place] [address] [place],  [address]          Radiology Studies: DG Chest Port 1 View  Result Date: 11/20/2019 CLINICAL DATA:  Acute respiratory failure EXAM: PORTABLE CHEST 1 VIEW COMPARISON:  Yesterday FINDINGS: Interstitial and airspace opacity which is diffuse and stable to mildly increased. Borderline heart size is stable. No visible effusion or pneumothorax. IMPRESSION: Stable or mildly increased generalized airspace disease. Electronically Signed   By: Monte Fantasia M.D.    On: 11/20/2019 07:43        Scheduled Meds: . chlorhexidine gluconate (MEDLINE KIT)  15 mL Mouth Rinse BID  . Chlorhexidine Gluconate Cloth  6 each Topical Daily  . DULoxetine  30 mg Oral QPM  . famotidine  20 mg Oral QHS  . feeding supplement (ENSURE ENLIVE)  237 mL Oral BID BM  . furosemide  40 mg Intravenous BID  . lamoTRIgine  75 mg Oral Daily  . linaclotide  145 mcg Oral QAC breakfast  . mouth rinse  15 mL Mouth Rinse 10 times per day  . Melatonin  18 mg Oral QHS  . mometasone-formoterol  2 puff Inhalation BID  . multivitamin with minerals  1 tablet Oral Daily  . potassium chloride  40 mEq Oral Once  . potassium chloride  40 mEq Oral Q4H  . predniSONE  40 mg Oral Q breakfast  . rivaroxaban  2.5 mg Oral BID  . rOPINIRole  3 mg Oral QHS  . sodium bicarbonate  650 mg Oral BID   Continuous Infusions: . sodium chloride Stopped (11/19/19 1859)  . ceFEPime (MAXIPIME) IV Stopped (11/19/19 1537)  . magnesium sulfate bolus IVPB       LOS: 10 days   Time spent= 35 mins    Merrell Borsuk Arsenio Loader, MD [facility]  If 7PM-7AM, please contact night-coverage  11/21/2019, 7:54 AM

## 2019-11-21 NOTE — Progress Notes (Signed)
Pharmacy Antibiotic Note  [name] is a 74 y.o. female admitted on 11/10/2019 with E.coli UTI and started on Rocephin.  Antibiotic then broaden to cover for PNA.  Currently on day #5 of cefepime (total abx day #11).    Renal function is improving, afebrile, WBC trending down.  Plan: Change cefepime to 2gm IV Q12H Monitor renal fxn, clinical progress   Height: _0  (172.7 cm) Weight: 158 lb 1.1 oz (71.7 kg) IBW/kg (Calculated) : 63.9  Temp (24hrs), Avg:98.9 F (37.2 C), Min:97.7 F (36.5 C), Max:100.5 F (38.1 C)  Recent Labs  Lab 11/17/19 0159 11/17/19 2145 11/18/19 0515 11/18/19 0515 11/19/19 0548 11/20/19 0346 11/20/19 1203 11/20/19 1651 11/21/19 0353  WBC 21.3*  --  15.5*  --  20.3* 16.2*  --   --  15.6*  CREATININE 2.61*   < > 3.03*   < > 2.50* 2.01* 1.87* 2.08* 1.55*   < > = values in this interval not displayed.    Estimated Creatinine Clearance: 31.1 mL/min (A) (by C-G formula based on SCr of 1.55 mg/dL (H)).    No Known Allergies   CTX 3/2> 3/8 Vanc 3/8 >> 3/10 Cefepime 3/8 >>  3/1 BCx - E.coli (pan sensitive except I to amp) 3/2 covid - negative 3/7 UCx - negative 3/7 BCx - NGTD 3/8 BAL - negative 3/9 MRSA PCR - negative  Chandler Stofer D. Mina Marble, PharmD, BCPS, [place] 11/21/2019, 10:44 AM

## 2019-11-21 NOTE — Progress Notes (Signed)
Occupational Therapy Treatment Patient Details Name: [name] MRN: [id number] DOB: 06-20-1946 Today's Date: 11/21/2019    History of present illness [name] is a 74yo female with a PMH significant for prior CVA, PAD, meningioma, hypothyroidism, hypertension, GERD, depression, bipolar disease, and anemia who presented to the ED on 3/1 with complaints of dysuria, urinary frequency, suprapubic abdominal pain, left-sided flank pain, and chills that began 1 week prior to admission. Pt with diagnosis of complicated UTI, with increasing AMS. Ct chest revealed ground glass opacities and mild cardiomegaly concerning for flash pulmonary edema, vs PNA, vs diffuse alveolar hemorrhage. ETT 3/8-3/9.   OT comments  Patient supine in bed on arrival. She was demonstrated some cognitive deficits.  Attention was poor and often lost train of thought or jumped to different topic.  She required max encouragement throughout session and patient greatly underestimated her abilities.  Able to transfer with min assist of 2 for safety and rolling walker.  Patient on 4L O2 throughout session and SpO2 remaining between 88-92 even with mobility.  Needs continued work on attention and anticipation, as well as overall strength and activity tolerance for ADLs.    Follow Up Recommendations  CIR;Supervision/Assistance - 24 hour    Equipment Recommendations       Recommendations for Other Services      Precautions / Restrictions Precautions Precautions: Fall Restrictions Weight Bearing Restrictions: No       Mobility Bed Mobility Overal bed mobility: Needs Assistance Bed Mobility: Supine to Sit     Supine to sit: Supervision;HOB elevated     General bed mobility comments: supervision for hx of impulsive behavior and safety  Transfers Overall transfer level: Needs assistance Equipment used: Rolling walker (2 wheeled) Transfers: Sit to/from [facility] Sit to Stand: Min assist;+2  safety/equipment Stand pivot transfers: +2 safety/equipment;Min assist       General transfer comment: Min assist for safety and steadiness. Pt demonstrated good initiation and ability to take a few steps over to the chair.     Balance Overall balance assessment: Needs assistance Sitting-balance support: Feet supported Sitting balance-Leahy Scale: Fair Sitting balance - Comments: (P) able to sit EOB without UE support however tends to sit with her forearms in her lap hunched over. Cuing for posture  Standing balance support: Bilateral upper extremity supported Standing balance-Leahy Scale: Poor Standing balance comment: (P) reliant on UE support, external assist                           ADL either performed or assessed with clinical judgement   ADL Overall ADL's : Needs assistance/impaired Eating/Feeding: Set up;Sitting                                   Functional mobility during ADLs: Minimal assistance;Rolling walker General ADL Comments: pt with weakness, decreased activity tolerance, impaired cognition     Vision       Perception     Praxis      Cognition Arousal/Alertness: Awake/alert Behavior During Therapy: Impulsive;WFL for tasks assessed/performed Overall Cognitive Status: Impaired/Different from baseline Area of Impairment: Attention;Memory;Following commands;Safety/judgement;Awareness;Problem solving                   Current Attention Level: Selective Memory: Decreased recall of precautions;Decreased short-term memory Following Commands: Follows one step commands with increased time Safety/Judgement: Decreased awareness of safety;Decreased awareness of deficits Awareness:  Emergent Problem Solving: Slow processing;Requires verbal cues;Difficulty sequencing General Comments: Patient spoke tangentially often throughout session.  Also an instance of crying.  She appeared depressed and mentioned wanting to be done living  multiple times.         Exercises Exercises: (P) Other exercises   Shoulder Instructions       General Comments (P) Pt is on 4L of supp O2. Resting SpO2 is 89-91%. On exertion, O2 briefly drops to 84% however increases into high 80s with pursed lip breathing.    Pertinent Vitals/ Pain       Pain Assessment: No/denies pain  Home Living                                          Prior Functioning/Environment              Frequency  Min 2X/week        Progress Toward Goals  OT Goals(current goals can now be found in the care plan section)  Progress towards OT goals: Progressing toward goals  Acute Rehab OT Goals Patient Stated Goal: go home and take a shower OT Goal Formulation: With patient Time For Goal Achievement: 12/03/19 Potential to Achieve Goals: Good  Plan Discharge plan remains appropriate    Co-evaluation    PT/OT/SLP Co-Evaluation/Treatment: Yes Reason for Co-Treatment: For patient/therapist safety;To address functional/ADL transfers;Necessary to address cognition/behavior during functional activity PT goals addressed during session: Mobility/safety with mobility OT goals addressed during session: ADL's and self-care      AM-PAC OT "6 Clicks" Daily Activity     Outcome Measure   Help from another person eating meals?: None Help from another person taking care of personal grooming?: A Little Help from another person toileting, which includes using toliet, bedpan, or urinal?: A Little Help from another person bathing (including washing, rinsing, drying)?: A Lot Help from another person to put on and taking off regular upper body clothing?: A Little Help from another person to put on and taking off regular lower body clothing?: A Little 6 Click Score: 18    End of Session Equipment Utilized During Treatment: Gait belt;Rolling walker;Oxygen  OT Visit Diagnosis: Unsteadiness on feet (R26.81);Muscle weakness (generalized)  (M62.81);Other symptoms and signs involving cognitive function   Activity Tolerance Patient tolerated treatment well   Patient Left in chair;with call bell/phone within reach;with chair alarm set   Nurse Communication Mobility status        Time: [id number] OT Time Calculation (min): 29 min  Charges: OT General Charges $OT Visit: 1 Visit OT Treatments $Therapeutic Activity: 8-22 mins  August Luz, OTR/L    Phylliss Bob 11/21/2019, 3:19 PM

## 2019-11-21 NOTE — Progress Notes (Addendum)
NAME:  TAKINA BUSSER, MRN:  [id number], DOB:  06-30-1946, LOS: 9 ADMISSION DATE:  11/10/2019, CONSULTATION DATE:  11/17/2019 REFERRING MD:  Dr. Horris Latino, CHIEF COMPLAINT:  Respiratory distress   Brief History   74yo female admitted with sepsis secondary to left pyelonephritis 3/1, PCCM consulted 3/8 due to increasing oxygen demand and AMS.    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  Chest CTA 3/8  Patchy bilateral groundglass opacities, consolidation mostly in the upper lobe, nonspecific lymphadenopathy  Micro Data:  COVID 3/2 > negative  Blood culture 3/1 > Enterobacter and E.coli  Blood culture 3/7 >   Urine culture 3/7 > multiple species BAL 3/9 >   Antimicrobials:  Ceftriaxone 3/2 > 3/8 Cefepime 3/8 >  Vancomycin 3/8 > off  Interim history/subjective:   Awake [place] frustrated, tired of getting IV sticks Objective   Blood pressure (Abnormal) 163/67, pulse 83, temperature (Abnormal) 100.5 F (38.1 C), temperature source Oral, resp. rate (Abnormal) 27, height _0  (1.727 m), weight 71.7 kg, SpO2 94 %.        Intake/Output Summary (Last 24 hours) at 11/21/2019 0818 Last data filed at 11/21/2019 0700 Gross per 24 hour  Intake 480 ml  Output 2000 ml  Net -1520 ml   Filed Weights   11/11/19 0010  Weight: 71.7 kg    Examination:  General chronically ill-appearing 74 year old white female she is resting in bed, somewhat anxious and frustrated but not in acute distress currently HEENT normocephalic atraumatic no  jugular venous distention appreciated mucous membranes are moist Pulmonary: Some scattered rhonchi, crackles bases, no accessory use, remains on high flow oxygen 9 L Cardiac: Soft systolic murmur, sinus rhythm, no radiation to murmur, Abdomen soft nontender Extremities are warm and dry without significant edema however has multiple areas of ecchymosis particularly on upper extremities Neuro/psych: Awake oriented no focal deficits anxious and frustrated  Resolved Hospital Problem list     Assessment & Plan:   Sepsis likely due to E.coli UTI, present on admission w/ E.Coli Bacteremia Acute hypoxic respiratory failure  Due to Bilateral infiltrates,  Likely has acute lung injury setting of sepsis Current daily smoker  Likely has COPD, 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 Acute Kidney Injury -in the setting of acute illness. Baseline creatinine 0.8-0.9, creatinine on admission 4.19 Acute metabolic encephalopathy > improving-In the setting of acute bacteremia  History of PVD with possible atheroemboli  Current liquid diarrhea  History of chronic constipation   pulm problem list:  Acute hypoxic respiratory failure in setting of diffuse bilateral infiltrates.  -Negative for DAH on FOB so doubt vasculitis. Does have borderline RF.  -Seem unlikely this is actual autoimmune process, favor more ALI vs or +/- UIP. Suspect element of pulmonary edema accounting for CXR and O2 requirement worsening  -PCXR continues to demonstrated diffuse what looks like on-going interstitial and intraalveolar process. Less patchy when comparing day prior film.  -still on high oxygen @ 9 liters.; still 6.6 liters positive but was > 8 yesterday -BP/BUN.cr  tolerating diuretics.  Plan Cont pred at 22m/d Repeat lasix 40 bid today BNP and cxr in am  F/u ANA Cont to treat infection  Needs out pt HRCT w/  olalere    Current tobacco abuse. prob COPD Plan Cont BDs   PErick ColaceACNP-BC LSacramentoPager # [phone] # [phone] no answer

## 2019-11-22 LAB — CBC
HCT: 29.3 % — ABNORMAL LOW (ref 36.0–46.0)
Hemoglobin: 10 g/dL — ABNORMAL LOW (ref 12.0–15.0)
MCH: 29.2 pg (ref 26.0–34.0)
MCHC: 34.1 g/dL (ref 30.0–36.0)
MCV: 85.4 fL (ref 80.0–100.0)
Platelets: 311 10*3/uL (ref 150–400)
RBC: 3.43 MIL/uL — ABNORMAL LOW (ref 3.87–5.11)
RDW: 14.7 % (ref 11.5–15.5)
WBC: 16.8 10*3/uL — ABNORMAL HIGH (ref 4.0–10.5)
nRBC: 0 % (ref 0.0–0.2)

## 2019-11-22 LAB — COMPREHENSIVE METABOLIC PANEL
ALT: 31 U/L (ref 0–44)
AST: 17 U/L (ref 15–41)
Albumin: 2.3 g/dL — ABNORMAL LOW (ref 3.5–5.0)
Alkaline Phosphatase: 126 U/L (ref 38–126)
Anion gap: 12 (ref 5–15)
BUN: 52 mg/dL — ABNORMAL HIGH (ref 8–23)
CO2: 28 mmol/L (ref 22–32)
Calcium: 7.6 mg/dL — ABNORMAL LOW (ref 8.9–10.3)
Chloride: 94 mmol/L — ABNORMAL LOW (ref 98–111)
Creatinine, Ser: 1.34 mg/dL — ABNORMAL HIGH (ref 0.44–1.00)
GFR calc Af Amer: 44 mL/min — ABNORMAL LOW (ref >60)
GFR calc non Af Amer: 38 mL/min — ABNORMAL LOW (ref >60)
Glucose, Bld: 195 mg/dL — ABNORMAL HIGH (ref 70–99)
Potassium: 2.8 mmol/L — ABNORMAL LOW (ref 3.5–5.1)
Sodium: 134 mmol/L — ABNORMAL LOW (ref 135–145)
Total Bilirubin: 1.1 mg/dL (ref 0.3–1.2)
Total Protein: 6.3 g/dL — ABNORMAL LOW (ref 6.5–8.1)

## 2019-11-22 LAB — POTASSIUM: Potassium: 4 mmol/L (ref 3.5–5.1)

## 2019-11-22 LAB — GLUCOSE, CAPILLARY: Glucose-Capillary: 179 mg/dL — ABNORMAL HIGH (ref 70–99)

## 2019-11-22 LAB — MAGNESIUM: Magnesium: 1.8 mg/dL (ref 1.7–2.4)

## 2019-11-22 MED ORDER — POTASSIUM CHLORIDE 20 MEQ/15ML (10%) PO SOLN
40.0000 meq | ORAL | Status: DC
  Administered 2019-11-22: 20 meq via ORAL
  Filled 2019-11-22: qty 30

## 2019-11-22 MED ORDER — POTASSIUM CHLORIDE 10 MEQ/100ML IV SOLN
10.0000 meq | Freq: Once | INTRAVENOUS | Status: AC
  Administered 2019-11-22: 10 meq via INTRAVENOUS
  Filled 2019-11-22: qty 100

## 2019-11-22 MED ORDER — POTASSIUM CHLORIDE CRYS ER 20 MEQ PO TBCR: 40.0000 meq | EXTENDED_RELEASE_TABLET | ORAL | Status: DC

## 2019-11-22 MED ORDER — GUAIFENESIN-CODEINE 100-10 MG/5ML PO SOLN: 5.0000 mL | Freq: Four times a day (QID) | ORAL | Status: DC | PRN

## 2019-11-22 MED ORDER — POTASSIUM CHLORIDE 10 MEQ/100ML IV SOLN
10.0000 meq | INTRAVENOUS | Status: AC
  Administered 2019-11-22 (×5): 10 meq via INTRAVENOUS
  Filled 2019-11-22 (×5): qty 100

## 2019-11-22 NOTE — Progress Notes (Signed)
PROGRESS NOTE    [name]  QVZ:[id number] DOB: [birth year] DOA: 11/10/2019 PCP: [name], MD      Brief Narrative:  [name] is a 74 y.o. F with hx HTN, CVA, AAA, PVD, history of VTE, hypothyroidism who presented with sepsis from left-sided pyelonephritis.  Admitted and started on broad-spectrum antibiotics.            Assessment & Plan:  Septic shock due to pyelonephritis E. coli bacteremia Has completed 9 days cefepime, is now afebrile. -Complete cefepime  Acute kidney injury secondary to ATN Creatinine improving, urine output good -Consult nephrology, appreciate recommendations -Daily BMP -Continue diuretics  Acute hypoxic respiratory failure Acute on chronic diastolic CHF Able to wean down to 4 L oxygen today. -Continue prednisone -Consult pulmonology, appreciate expert recommendations -Continue Lasix IV twice daily  Hypokalemia Hypomagnesemia -Continue aggressive repletion  Anemia of chronic disease Globin stable, no clinical bleeding  Hypertension Peripheral vascular disease, secondary prevention Blood pressure controlled  Mood disorder -Continue clonazepam, duloxetine, lamotrigine  History of VTE -Continue Xarelto  COPD -Continue Dulera  Restless leg -Continue ropinirole  GERD -Continue Pepcid  Moderate protein calorie malnutrition As evidenced by severe critical illness, decreased muscle mass intact, decreased oral intake -Consult dietitian -Continue Ensure  IBS -Continue Linzess        Disposition: The patient was admitted with septic shock, has had resultant acute renal failure and respiratory failure.   She requires ongoing IV Lasix, high levels of new supplemental oxygen, and extensive rehabilitation, as she was fully independent prior to admission.        MDM: The below labs and imaging reports were reviewed and summarized above.  Medication management as above.  This is an acute illness with  severe threat to life, she remains on 4 L of oxygen, which is new.   DVT prophylaxis: Not applicable, on Xarelto Code Status: Full code Family Communication: Son by phone    Consultants:   Critical care  Nephrology  Procedures:      Antimicrobials:       Culture data:               Subjective: Patient is feeling weak, has trouble breathing, confusion, vomiting, diarrhea.  Objective: Vitals:   11/22/19 0336 11/22/19 0759 11/22/19 0800 11/22/19 1200  BP: (!) 152/72  137/71 126/66  Pulse: 77  85 89  Resp: 20     Temp: 99 F (37.2 C)  98.2 F (36.8 C)   TempSrc: Oral  Oral   SpO2: 92% 92% 93% 96%  Weight:      Height:        Intake/Output Summary (Last 24 hours) at 11/22/2019 1357 Last data filed at 11/22/2019 0300 Gross per 24 hour  Intake 640 ml  Output 4 ml  Net 636 ml   Filed Weights   11/11/19 0010  Weight: 71.7 kg    Examination: General appearance: Thin elderly adult female, alert and in no acute distress.  Lying in bed HEENT: Anicteric, conjunctiva pink, lids and lashes normal. No nasal deformity, discharge, epistaxis.  Lips moist.   Skin: Warm and dry.  No jaundice.  No suspicious rashes or lesions. Cardiac: RRR, nl S1-S2, no murmurs appreciated.  Capillary refill is brisk.  JVP normal.  Is LE edema.  Radial pulses 2+ and symmetric. Respiratory: Tachypneic, lung sounds diminished, no rales or wheezing appreciated. Abdomen: Abdomen soft.  No TTP or guarding. No ascites, distension, hepatosplenomegaly.   MSK: No deformities or effusions.  Mild diffuse loss of subcutaneous muscle mass and fat Neuro: Awake and alert.  EOMI, moves all extremities. Speech fluent.    Psych: Sensorium intact and responding to questions, attention normal. Affect anxious.  Judgment and insight appear normal.    Data Reviewed: I have personally reviewed following labs and imaging studies:  CBC: Recent Labs  Lab 11/16/19 0246 11/16/19 0246 11/17/19 0159  11/17/19 1812 11/18/19 0515 11/19/19 0548 11/20/19 0346 11/21/19 0353 11/22/19 0244  WBC 15.6*   < > 21.3*  --  15.5* 20.3* 16.2* 15.6* 16.8*  NEUTROABS 12.1*  --  17.5*  --  14.3* 18.0* 14.2*  --   --   HGB 11.2*   < > 10.7*   < > 9.4* 9.4* 9.1* 10.2* 10.0*  HCT 32.2*   < > 30.6*   < > 27.5* 27.1* 26.3* 30.1* 29.3*  MCV 83.9   < > 83.2  --  84.4 84.2 85.4 85.0 85.4  PLT 115*   < > 157  --  188 280 301 321 311   < > = values in this interval not displayed.   Basic Metabolic Panel: Recent Labs  Lab 11/17/19 2145 11/18/19 0515 11/20/19 0346 11/20/19 0346 11/20/19 1203 11/20/19 1651 11/21/19 0353 11/21/19 1659 11/22/19 0244  NA 132*   < > 136   < > 136 137 138 134* 134*  K 3.5   < > 3.2*   < > 3.9 3.3* 3.3* 3.4* 2.8*  CL 103   < > 101   < > 102 99 99 94* 94*  CO2 16*   < > 21*   < > 20* _0 GLUCOSE 165*   < > 176*   < > 182* 222* 131* 162* 195*  BUN 45*   < > 63*   < > 63* 65* 60* 53* 52*  CREATININE 2.74*   < > 2.01*   < > 1.87* 2.08* 1.55* 1.40* 1.34*  CALCIUM 7.5*   < > 7.9*   < > 7.9* 7.8* 7.8* 7.8* 7.6*  MG 1.3*  --  1.8  --   --   --  1.6*  --  1.8  PHOS  --   --  4.0  --   --   --   --   --   --    < > = values in this interval not displayed.   GFR: Estimated Creatinine Clearance: 36 mL/min (A) (by C-G formula based on SCr of 1.34 mg/dL (H)). Liver Function Tests: Recent Labs  Lab 11/21/19 0353 11/22/19 0244  AST 22 17  ALT 28 31  ALKPHOS 129* 126  BILITOT 1.2 1.1  PROT 6.1* 6.3*  ALBUMIN 2.3* 2.3*   No results for input(s): LIPASE, AMYLASE in the last 168 hours. No results for input(s): AMMONIA in the last 168 hours. Coagulation Profile: No results for input(s): INR, PROTIME in the last 168 hours. Cardiac Enzymes: No results for input(s): CKTOTAL, CKMB, CKMBINDEX, TROPONINI in the last 168 hours. BNP (last 3 results) Recent Labs    03/10/19 1438  PROBNP 263   HbA1C: No results for input(s): HGBA1C in the last 72 hours. CBG: No results  for input(s): GLUCAP in the last 168 hours. Lipid Profile: No results for input(s): CHOL, HDL, LDLCALC, TRIG, CHOLHDL, LDLDIRECT in the last 72 hours. Thyroid Function Tests: No results for input(s): TSH, T4TOTAL, FREET4, T3FREE, THYROIDAB in the last 72 hours. Anemia Panel: No results for input(s): VITAMINB12, FOLATE, FERRITIN,  TIBC, IRON, RETICCTPCT in the last 72 hours. Urine analysis:    Component Value Date/Time   COLORURINE YELLOW 11/18/2019 0816   APPEARANCEUR CLEAR 11/18/2019 0816   LABSPEC 1.015 11/18/2019 0816   PHURINE 5.5 11/18/2019 0816   GLUCOSEU NEGATIVE 11/18/2019 0816   HGBUR SMALL (A) 11/18/2019 0816   BILIRUBINUR NEGATIVE 11/18/2019 0816   BILIRUBINUR negative 11/10/2019 1432   BILIRUBINUR neg 07/31/2013 1752   KETONESUR NEGATIVE 11/18/2019 0816   PROTEINUR 30 (A) 11/18/2019 0816   UROBILINOGEN 0.2 11/10/2019 1432   NITRITE NEGATIVE 11/18/2019 0816   LEUKOCYTESUR SMALL (A) 11/18/2019 0816   Sepsis Labs: _0 (procalcitonin:4,lacticacidven:4)  ) Recent Results (from the past 240 hour(s))  Culture, blood (routine x 2)     Status: None   Collection Time: 11/16/19 12:13 PM   Specimen: BLOOD  Result Value Ref Range Status   Specimen Description BLOOD LEFT ANTECUBITAL  Final   Special Requests   Final    BOTTLES DRAWN AEROBIC ONLY Blood Culture results may not be optimal due to an inadequate volume of blood received in culture bottles   Culture   Final    NO GROWTH 5 DAYS Performed at [facility], [place] [address] [place], [place] [address]    Report Status 11/21/2019 FINAL  Final  Culture, blood (routine x 2)     Status: None   Collection Time: 11/16/19 12:13 PM   Specimen: BLOOD  Result Value Ref Range Status   Specimen Description BLOOD LEFT ANTECUBITAL  Final   Special Requests   Final    BOTTLES DRAWN AEROBIC ONLY Blood Culture results may not be optimal due to an inadequate volume of blood received in culture bottles   Culture   Final     NO GROWTH 5 DAYS Performed at [facility], [place] [address], [place], [place] [address]    Report Status 11/21/2019 FINAL  Final  Culture, Urine     Status: Abnormal   Collection Time: 11/16/19  1:59 PM   Specimen: Urine, Clean Catch  Result Value Ref Range Status   Specimen Description URINE, CLEAN CATCH  Final   Special Requests   Final    NONE Performed at [facility], [place] [address], [place], [place] [address]    Culture MULTIPLE SPECIES PRESENT, SUGGEST RECOLLECTION (A)  Final   Report Status 11/18/2019 FINAL  Final  Acid Fast Smear (AFB)     Status: None   Collection Time: 11/17/19  5:57 PM   Specimen: Bronchial Alveolar Lavage  Result Value Ref Range Status   AFB Specimen Processing Concentration  Final   Acid Fast Smear Negative  Final    Comment: (NOTE) Performed At: [facility] [place], [place] [id number] Rush Farmer MD YC:[id number]    Source (AFB) BRONCHIAL ALVEOLAR LAVAGE  Final    Comment: Performed at [facility], [place] [address] [place], [place] [address]  Culture, respiratory     Status: None   Collection Time: 11/17/19  5:57 PM   Specimen: Bronchoalveolar Lavage; Respiratory  Result Value Ref Range Status   Specimen Description BRONCHIAL ALVEOLAR LAVAGE  Final   Special Requests NONE  Final   Gram Stain   Final    FEW WBC PRESENT,BOTH PMN AND MONONUCLEAR NO ORGANISMS SEEN    Culture   Final    Consistent with normal respiratory flora. Performed at [facility], [place] [address] [place], [place] [address]    Report  Status 11/19/2019 FINAL  Final  Fungus Culture With Stain     Status: None (Preliminary result)   Collection Time: 11/17/19  5:57 PM   Specimen: Bronchial Alveolar Lavage  Result Value Ref Range Status   Fungus Stain Final report  Final    Comment: (NOTE) Performed At: [facility] [place], [place] [id number] Rush Farmer MD JH:[id number]    Fungus (Mycology)  Culture PENDING  Incomplete   Fungal Source BRONCHIAL ALVEOLAR LAVAGE  Final    Comment: Performed at [facility], [place] [address] [place], [place] [address]  Pneumocystis smear by DFA     Status: None   Collection Time: 11/17/19  5:57 PM   Specimen: Bronchoalveolar Lavage; Respiratory  Result Value Ref Range Status   Specimen Source-PJSRC BRONCHIAL ALVEOLAR LAVAGE  Final   Pneumocystis jiroveci Ag NEGATIVE  Final    Comment: Performed at [facility] Performed at [facility], [address], [place], [place] [address]   Fungus Culture Result     Status: None   Collection Time: 11/17/19  5:57 PM  Result Value Ref Range Status   Result 1 Comment  Final    Comment: (NOTE) KOH/Calcofluor preparation:  no fungus observed. Performed At: [facility] [place], [place] [id number] Rush Farmer MD OY:[id number]   MRSA PCR Screening     Status: None   Collection Time: 11/18/19  1:12 PM   Specimen: Nasal Mucosa; Nasopharyngeal  Result Value Ref Range Status   MRSA by PCR NEGATIVE NEGATIVE Final    Comment:        The GeneXpert MRSA Assay (FDA approved for NASAL specimens only), is one component of a comprehensive MRSA colonization surveillance program. It is not intended to diagnose MRSA infection nor to guide or monitor treatment for MRSA infections. Performed at [facility], [place] [address], [place], [place] [address]          Radiology Studies: DG Chest Port 1 View  Result Date: 11/21/2019 CLINICAL DATA:  Acute respiratory failure with hypoxemia. EXAM: PORTABLE CHEST 1 VIEW COMPARISON:  11/20/2019 and CT chest 11/17/2019. FINDINGS: Trachea is midline. Heart size stable. Thoracic aorta is calcified. Diffuse mixed interstitial and airspace opacification, possibly minimally improved from 11/20/2019. There may be tiny bilateral pleural effusions. IMPRESSION: Diffuse mixed interstitial and airspace opacification, possibly minimally  improved from 11/20/2019. Probable trace bilateral pleural effusions. Findings are indicative congestive heart failure and/or pneumonia. Electronically Signed   By: Lorin Picket M.D.   On: 11/21/2019 09:23        Scheduled Meds: . chlorhexidine gluconate (MEDLINE KIT)  15 mL Mouth Rinse BID  . Chlorhexidine Gluconate Cloth  6 each Topical Daily  . DULoxetine  30 mg Oral QPM  . famotidine  20 mg Oral QHS  . feeding supplement (ENSURE ENLIVE)  237 mL Oral BID BM  . furosemide  40 mg Intravenous BID  . lamoTRIgine  75 mg Oral Daily  . linaclotide  145 mcg Oral QAC breakfast  . mouth rinse  15 mL Mouth Rinse 10 times per day  . Melatonin  18 mg Oral QHS  . mometasone-formoterol  2 puff Inhalation BID  . multivitamin with minerals  1 tablet Oral Daily  . predniSONE  40 mg Oral Q breakfast  . rivaroxaban  2.5 mg Oral BID  . rOPINIRole  3 mg Oral QHS   Continuous Infusions: . sodium chloride Stopped (11/19/19 1859)  . potassium chloride  10 mEq (11/22/19 1216)     LOS: 11 days    Time spent: 35 minutes    Edwin Dada, MD [facility] 11/22/2019, 1:57 PM     Please page though [place] or Epic secure chat:  For [employer], Adult nurse

## 2019-11-22 NOTE — Progress Notes (Signed)
Pt spent one hour in sat up in the chair before wanting to get back into bed. Asked patient if she could stay up through dinner and she responded that she doesn't feel like it and wants to go to bed.

## 2019-11-23 DIAGNOSIS — R6521 Severe sepsis with septic shock: Secondary | ICD-10-CM

## 2019-11-23 LAB — COMPREHENSIVE METABOLIC PANEL
ALT: 30 U/L (ref 0–44)
AST: 17 U/L (ref 15–41)
Albumin: 2.4 g/dL — ABNORMAL LOW (ref 3.5–5.0)
Alkaline Phosphatase: 126 U/L (ref 38–126)
Anion gap: 11 (ref 5–15)
BUN: 52 mg/dL — ABNORMAL HIGH (ref 8–23)
CO2: 29 mmol/L (ref 22–32)
Calcium: 8.2 mg/dL — ABNORMAL LOW (ref 8.9–10.3)
Chloride: 95 mmol/L — ABNORMAL LOW (ref 98–111)
Creatinine, Ser: 1.55 mg/dL — ABNORMAL HIGH (ref 0.44–1.00)
GFR calc Af Amer: 37 mL/min — ABNORMAL LOW (ref >60)
GFR calc non Af Amer: 32 mL/min — ABNORMAL LOW (ref >60)
Glucose, Bld: 158 mg/dL — ABNORMAL HIGH (ref 70–99)
Potassium: 3.8 mmol/L (ref 3.5–5.1)
Sodium: 135 mmol/L (ref 135–145)
Total Bilirubin: 0.8 mg/dL (ref 0.3–1.2)
Total Protein: 6.5 g/dL (ref 6.5–8.1)

## 2019-11-23 LAB — CBC WITH DIFFERENTIAL/PLATELET
Abs Immature Granulocytes: 0.23 10*3/uL — ABNORMAL HIGH (ref 0.00–0.07)
Basophils Absolute: 0 10*3/uL (ref 0.0–0.1)
Basophils Relative: 0 %
Eosinophils Absolute: 0 10*3/uL (ref 0.0–0.5)
Eosinophils Relative: 0 %
HCT: 31.9 % — ABNORMAL LOW (ref 36.0–46.0)
Hemoglobin: 10.6 g/dL — ABNORMAL LOW (ref 12.0–15.0)
Immature Granulocytes: 2 %
Lymphocytes Relative: 10 %
Lymphs Abs: 1.2 10*3/uL (ref 0.7–4.0)
MCH: 28.9 pg (ref 26.0–34.0)
MCHC: 33.2 g/dL (ref 30.0–36.0)
MCV: 86.9 fL (ref 80.0–100.0)
Monocytes Absolute: 0.4 10*3/uL (ref 0.1–1.0)
Monocytes Relative: 4 %
Neutro Abs: 10.2 10*3/uL — ABNORMAL HIGH (ref 1.7–7.7)
Neutrophils Relative %: 84 %
Platelets: 411 10*3/uL — ABNORMAL HIGH (ref 150–400)
RBC: 3.67 MIL/uL — ABNORMAL LOW (ref 3.87–5.11)
RDW: 15.3 % (ref 11.5–15.5)
WBC: 12.1 10*3/uL — ABNORMAL HIGH (ref 4.0–10.5)
nRBC: 0 % (ref 0.0–0.2)

## 2019-11-23 LAB — MAGNESIUM: Magnesium: 1.6 mg/dL — ABNORMAL LOW (ref 1.7–2.4)

## 2019-11-23 LAB — CBC
HCT: 32.1 % — ABNORMAL LOW (ref 36.0–46.0)
Hemoglobin: 10.7 g/dL — ABNORMAL LOW (ref 12.0–15.0)
MCH: 29.2 pg (ref 26.0–34.0)
MCHC: 33.3 g/dL (ref 30.0–36.0)
MCV: 87.5 fL (ref 80.0–100.0)
Platelets: 396 10*3/uL (ref 150–400)
RBC: 3.67 MIL/uL — ABNORMAL LOW (ref 3.87–5.11)
RDW: 15.1 % (ref 11.5–15.5)
WBC: 17.4 10*3/uL — ABNORMAL HIGH (ref 4.0–10.5)
nRBC: 0 % (ref 0.0–0.2)

## 2019-11-23 NOTE — Progress Notes (Signed)
PROGRESS NOTE    [name]  WHQ:[id number] DOB: August 29, 1946 DOA: 11/10/2019 PCP: [name], MD      Brief Narrative:  [name] is a 74 y.o. F with hx HTN, CVA, AAA, PVD, history of VTE, hypothyroidism who presented with sepsis from left-sided pyelonephritis.  Admitted and started on broad-spectrum antibiotics.            Assessment & Plan:  Septic shock due to pyelonephritis E. coli bacteremia Completed 9 days cefepime, is now afebrile, hemodynamically stable. -Monitor fever curve    Acute kidney injury secondary to ATN Cr up slightly today, 1.34 >> 1.55.  1L UOP yesterday with Lasix. -Hold diuretics  Acute hypoxic respiratory failure Acute on chronic diastolic CHF Peak O2 need 15L on 3/8, currently weaned down to 3 L this morning. -Continue prednisone -Consult pulmonology, appreciate expert recommendations -Transition to MedSurg with telemetry  Hypokalemia Hypomagnesemia Repleted  Anemia of chronic disease Hgb stable, no clinical bleeding  Hypertension Peripheral vascular disease, secondary prevention Blood pressure controlled  Mood disorder -Continue clonazepam, duloxetine, lamotrigine  History of VTE -Continue Xarelto  COPD -Continue Dulera -Continue PRN Duo-neb  Restless leg -Continue ropinirole  GERD -Continue Pepcid  Moderate protein calorie malnutrition As evidenced by severe critical illness, decreased muscle mass intact, decreased oral intake -Consult dietitian -Continue Ensure  IBS -Continue Linzess  Mediastinal lymphadenopathy Incidental finding on CT chest -Follow-up CT chest with contrast in 3 months, on or around June 7       Disposition: Patient was admitted with septic shock, had had subsequent acute renal failure and respiratory failure.  Her renal function is gradually improving, but is now stabilized with CKD stage III-IV.  Her shock has resolved.  At this point she has continued respiratory  failure, which appears to be gradually improving now with prednisone.  We will continue prednisone, chest physiotherapy.  She will need extensive rehabilitation, as at present she requires assistance even for bed mobility, and she was fully independent prior to admission.          MDM: The below labs and imaging reports reviewed and summarized above.  Medication management as above.     DVT prophylaxis: Not applicable, on Xarelto Code Status: Full code Family Communication: We will call son by phone    Consultants:   Critical care  Nephrology  Procedures:   3/2 CT abdomen and pelvis without contrast-no acute intra-abdominal pathology, pulmonary fibrosis in the lung bases, similar to prior studies  3/8 intubation  3/8 CT chest without contrast new severe patchy groundglass opacity consolidation and septal thickening throughout both lungs most prominent in the upper lobes, differential is edema, multilobar pneumonia, diffuse alveolar hemorrhage  3/8 bronchoscopy-no evidence of hemorrhage BAL obtained, no biopsy  3/9Echocardiogram-normal EF, grade 1 diastolic dysfunction, no significant valvular disease  3/9 extubation  3/9 renal ultrasound-unremarkable  Antimicrobials:   Ceftriaxone 3/1>>3/7  Cefepime 3/8>> 3/12  Culture data:   3/1 blood culture  3/7 blood culture x2-no growth   3/7 urine culture-multiple species          Subjective: [name] is feeling tired, weak, has some chest tightness, but no confusion, fever, vomiting.        Objective: Vitals:   11/22/19 1730 11/22/19 1900 11/22/19 2000 11/23/19 0739  BP:   (!) 114/58   Pulse:  100 94   Resp: _0 Temp:   98.3 F (36.8 C)   TempSrc:   Oral   SpO2:  100% 100% 94%  Weight:      Height:        Intake/Output Summary (Last 24 hours) at 11/23/2019 1045 Last data filed at 11/22/2019 2345 Gross per 24 hour  Intake 555.44 ml  Output 1000 ml  Net -444.56 ml   Filed Weights    11/11/19 0010  Weight: 71.7 kg    Examination: General appearance: Elderly adult female, alert and in no acute distress.  Appears debilitated, lying in bed. HEENT: Anicteric, conjunctiva pink, lids and lashes normal. No nasal deformity, discharge, epistaxis.  Lips moist, teeth normal. OP moist, no oral lesions.  Hearing normal. Skin: Warm and dry.  No suspicious rashes or lesions. Cardiac: RRR, no murmurs appreciated.  No LE edema.    Respiratory: Normal respiratory rate and rhythm.  CTAB without rales or wheezes. Abdomen: Abdomen soft.  No tenderness to palpation or guarding. No ascites, distension, hepatosplenomegaly.   MSK: No deformities or effusions of the large joints of the upper or lower extremities bilaterally. Neuro: Awake and alert. Naming is grossly intact, and the patient's recall, recent and remote, as well as general fund of knowledge seem within normal limits.  Muscle tone diminished, without fasciculations.  Moves upper extremities equally but with generalized weakness and with normal coordination.  Speech fluent.    Psych: Sensorium intact and responding to questions, attention normal. Affect normal.  Judgment and insight appear normal.      Data Reviewed: I have personally reviewed following labs and imaging studies:  CBC: Recent Labs  Lab 11/17/19 0159 11/17/19 1812 11/18/19 0515 11/18/19 0515 11/19/19 0548 11/20/19 0346 11/21/19 0353 11/22/19 0244 11/23/19 0536  WBC 21.3*  --  15.5*   < > 20.3* 16.2* 15.6* 16.8* 17.4*  NEUTROABS 17.5*  --  14.3*  --  18.0* 14.2*  --   --   --   HGB 10.7*   < > 9.4*   < > 9.4* 9.1* 10.2* 10.0* 10.7*  HCT 30.6*   < > 27.5*   < > 27.1* 26.3* 30.1* 29.3* 32.1*  MCV 83.2  --  84.4   < > 84.2 85.4 85.0 85.4 87.5  PLT 157  --  188   < > 280 301 321 311 396   < > = values in this interval not displayed.   Basic Metabolic Panel: Recent Labs  Lab 11/17/19 2145 11/18/19 0515 11/20/19 0346 11/20/19 1203 11/20/19 1651  11/20/19 1651 11/21/19 0353 11/21/19 1659 11/22/19 0244 11/22/19 2214 11/23/19 0536  NA 132*   < > 136   < > 137  --  138 134* 134*  --  135  K 3.5   < > 3.2*   < > 3.3*   < > 3.3* 3.4* 2.8* 4.0 3.8  CL 103   < > 101   < > 99  --  99 94* 94*  --  95*  CO2 16*   < > 21*   < > 23  --  _0 --  29  GLUCOSE 165*   < > 176*   < > 222*  --  131* 162* 195*  --  158*  BUN 45*   < > 63*   < > 65*  --  60* 53* 52*  --  52*  CREATININE 2.74*   < > 2.01*   < > 2.08*  --  1.55* 1.40* 1.34*  --  1.55*  CALCIUM 7.5*   < > 7.9*   < >  7.8*  --  7.8* 7.8* 7.6*  --  8.2*  MG 1.3*  --  1.8  --   --   --  1.6*  --  1.8  --  1.6*  PHOS  --   --  4.0  --   --   --   --   --   --   --   --    < > = values in this interval not displayed.   GFR: Estimated Creatinine Clearance: 31.1 mL/min (A) (by C-G formula based on SCr of 1.55 mg/dL (H)). Liver Function Tests: Recent Labs  Lab 11/21/19 0353 11/22/19 0244 11/23/19 0536  AST _0 ALT _1 ALKPHOS 129* 126 126  BILITOT 1.2 1.1 0.8  PROT 6.1* 6.3* 6.5  ALBUMIN 2.3* 2.3* 2.4*   No results for input(s): LIPASE, AMYLASE in the last 168 hours. No results for input(s): AMMONIA in the last 168 hours. Coagulation Profile: No results for input(s): INR, PROTIME in the last 168 hours. Cardiac Enzymes: No results for input(s): CKTOTAL, CKMB, CKMBINDEX, TROPONINI in the last 168 hours. BNP (last 3 results) Recent Labs    03/10/19 1438  PROBNP 263   HbA1C: No results for input(s): HGBA1C in the last 72 hours. CBG: Recent Labs  Lab 11/22/19 1729  GLUCAP 179*   Lipid Profile: No results for input(s): CHOL, HDL, LDLCALC, TRIG, CHOLHDL, LDLDIRECT in the last 72 hours. Thyroid Function Tests: No results for input(s): TSH, T4TOTAL, FREET4, T3FREE, THYROIDAB in the last 72 hours. Anemia Panel: No results for input(s): VITAMINB12, FOLATE, FERRITIN, TIBC, IRON, RETICCTPCT in the last 72 hours. Urine analysis:    Component Value Date/Time     COLORURINE YELLOW 11/18/2019 0816   APPEARANCEUR CLEAR 11/18/2019 0816   LABSPEC 1.015 11/18/2019 0816   PHURINE 5.5 11/18/2019 0816   GLUCOSEU NEGATIVE 11/18/2019 0816   HGBUR SMALL (A) 11/18/2019 0816   BILIRUBINUR NEGATIVE 11/18/2019 0816   BILIRUBINUR negative 11/10/2019 1432   BILIRUBINUR neg 07/31/2013 1752   KETONESUR NEGATIVE 11/18/2019 0816   PROTEINUR 30 (A) 11/18/2019 0816   UROBILINOGEN 0.2 11/10/2019 1432   NITRITE NEGATIVE 11/18/2019 0816   LEUKOCYTESUR SMALL (A) 11/18/2019 0816   Sepsis Labs: _2 (procalcitonin:4,lacticacidven:4)  ) Recent Results (from the past 240 hour(s))  Culture, blood (routine x 2)     Status: None   Collection Time: 11/16/19 12:13 PM   Specimen: BLOOD  Result Value Ref Range Status   Specimen Description BLOOD LEFT ANTECUBITAL  Final   Special Requests   Final    BOTTLES DRAWN AEROBIC ONLY Blood Culture results may not be optimal due to an inadequate volume of blood received in culture bottles   Culture   Final    NO GROWTH 5 DAYS Performed at [facility], [place] [address], [place], [place] [address]    Report Status 11/21/2019 FINAL  Final  Culture, blood (routine x 2)     Status: None   Collection Time: 11/16/19 12:13 PM   Specimen: BLOOD  Result Value Ref Range Status   Specimen Description BLOOD LEFT ANTECUBITAL  Final   Special Requests   Final    BOTTLES DRAWN AEROBIC ONLY Blood Culture results may not be optimal due to an inadequate volume of blood received in culture bottles   Culture   Final    NO GROWTH 5 DAYS Performed at [facility], [place] [address] [place], [place] [address]    Report Status  11/21/2019 FINAL  Final  Culture, Urine     Status: Abnormal   Collection Time: 11/16/19  1:59 PM   Specimen: Urine, Clean Catch  Result Value Ref Range Status   Specimen Description URINE, CLEAN CATCH  Final   Special Requests   Final    NONE Performed at [facility], [place] [address],  [place], [place] [address]    Culture MULTIPLE SPECIES PRESENT, SUGGEST RECOLLECTION (A)  Final   Report Status 11/18/2019 FINAL  Final  Acid Fast Smear (AFB)     Status: None   Collection Time: 11/17/19  5:57 PM   Specimen: Bronchial Alveolar Lavage  Result Value Ref Range Status   AFB Specimen Processing Concentration  Final   Acid Fast Smear Negative  Final    Comment: (NOTE) Performed At: [facility] [place], [place] [id number] Rush Farmer MD YP:[id number]    Source (AFB) BRONCHIAL ALVEOLAR LAVAGE  Final    Comment: Performed at [facility], [place] [address], [place], [place] [address]  Culture, respiratory     Status: None   Collection Time: 11/17/19  5:57 PM   Specimen: Bronchoalveolar Lavage; Respiratory  Result Value Ref Range Status   Specimen Description BRONCHIAL ALVEOLAR LAVAGE  Final   Special Requests NONE  Final   Gram Stain   Final    FEW WBC PRESENT,BOTH PMN AND MONONUCLEAR NO ORGANISMS SEEN    Culture   Final    Consistent with normal respiratory flora. Performed at [facility], [place] [address], [place], [place] [address]    Report Status 11/19/2019 FINAL  Final  Fungus Culture With Stain     Status: None (Preliminary result)   Collection Time: 11/17/19  5:57 PM   Specimen: Bronchial Alveolar Lavage  Result Value Ref Range Status   Fungus Stain Final report  Final    Comment: (NOTE) Performed At: [facility] [place], [place] [id number] Rush Farmer MD [id number]    Fungus (Mycology) Culture PENDING  Incomplete   Fungal Source BRONCHIAL ALVEOLAR LAVAGE  Final    Comment: Performed at [facility], [place] [address] [name], [place] [address]  Pneumocystis smear by DFA     Status: None   Collection Time: 11/17/19  5:57 PM   Specimen: Bronchoalveolar Lavage; Respiratory  Result Value Ref Range Status   Specimen Source-PJSRC BRONCHIAL ALVEOLAR LAVAGE  Final   Pneumocystis jiroveci Ag  NEGATIVE  Final    Comment: Performed at [facility] Performed at [facility], [address] [place], [place] [address]   Fungus Culture Result     Status: None   Collection Time: 11/17/19  5:57 PM  Result Value Ref Range Status   Result 1 Comment  Final    Comment: (NOTE) KOH/Calcofluor preparation:  no fungus observed. Performed At: [facility] [address] [place], [place] [id number] Rush Farmer MD [id number]   MRSA PCR Screening     Status: None   Collection Time: 11/18/19  1:12 PM   Specimen: Nasal Mucosa; Nasopharyngeal  Result Value Ref Range Status   MRSA by PCR NEGATIVE NEGATIVE Final    Comment:        The GeneXpert MRSA Assay (FDA approved for NASAL specimens only), is one component of a comprehensive MRSA colonization surveillance program. It is not intended to diagnose MRSA infection nor to guide or monitor treatment for MRSA infections. Performed at [facility],  [address] [place], [place] [address]          Radiology Studies: No results found.      Scheduled Meds: . chlorhexidine gluconate (MEDLINE KIT)  15 mL Mouth Rinse BID  . Chlorhexidine Gluconate Cloth  6 each Topical Daily  . DULoxetine  30 mg Oral QPM  . famotidine  20 mg Oral QHS  . feeding supplement (ENSURE ENLIVE)  237 mL Oral BID BM  . lamoTRIgine  75 mg Oral Daily  . linaclotide  145 mcg Oral QAC breakfast  . mouth rinse  15 mL Mouth Rinse 10 times per day  . Melatonin  18 mg Oral QHS  . mometasone-formoterol  2 puff Inhalation BID  . multivitamin with minerals  1 tablet Oral Daily  . predniSONE  40 mg Oral Q breakfast  . rivaroxaban  2.5 mg Oral BID  . rOPINIRole  3 mg Oral QHS   Continuous Infusions: . sodium chloride Stopped (11/19/19 1859)     LOS: 12 days    Time spent: 25 minutes    Edwin Dada, MD [facility] 11/23/2019, 10:45 AM     Please page though [employer] or Epic secure chat:  For [employer], Adult nurse

## 2019-11-24 ENCOUNTER — Telehealth: Payer: Self-pay

## 2019-11-24 LAB — BASIC METABOLIC PANEL
Anion gap: 14 (ref 5–15)
BUN: 47 mg/dL — ABNORMAL HIGH (ref 8–23)
CO2: 30 mmol/L (ref 22–32)
Calcium: 8.1 mg/dL — ABNORMAL LOW (ref 8.9–10.3)
Chloride: 91 mmol/L — ABNORMAL LOW (ref 98–111)
Creatinine, Ser: 1.43 mg/dL — ABNORMAL HIGH (ref 0.44–1.00)
GFR calc Af Amer: 41 mL/min — ABNORMAL LOW (ref >60)
GFR calc non Af Amer: 35 mL/min — ABNORMAL LOW (ref >60)
Glucose, Bld: 114 mg/dL — ABNORMAL HIGH (ref 70–99)
Potassium: 3.8 mmol/L (ref 3.5–5.1)
Sodium: 135 mmol/L (ref 135–145)

## 2019-11-24 MED ORDER — PREDNISONE 20 MG PO TABS
30.0000 mg | ORAL_TABLET | Freq: Every day | ORAL | Status: DC
  Administered 2019-11-25 – 2019-11-27 (×3): 30 mg via ORAL
  Filled 2019-11-24 (×3): qty 1

## 2019-11-24 MED ORDER — POLYETHYLENE GLYCOL 3350 17 G PO PACK
17.0000 g | PACK | Freq: Once | ORAL | Status: AC
  Administered 2019-11-24: 17 g via ORAL
  Filled 2019-11-24: qty 1

## 2019-11-24 MED ORDER — FUROSEMIDE 40 MG PO TABS
40.0000 mg | ORAL_TABLET | Freq: Every day | ORAL | Status: DC
  Administered 2019-11-24 – 2019-11-27 (×4): 40 mg via ORAL
  Filled 2019-11-24 (×4): qty 1

## 2019-11-24 MED ORDER — PREDNISONE 20 MG PO TABS: 20.0000 mg | ORAL_TABLET | Freq: Every day | ORAL | Status: DC

## 2019-11-24 MED ORDER — SENNOSIDES-DOCUSATE SODIUM 8.6-50 MG PO TABS
2.0000 | ORAL_TABLET | Freq: Once | ORAL | Status: DC
  Filled 2019-11-24: qty 2

## 2019-11-24 MED ORDER — PREDNISONE 10 MG PO TABS: 10.0000 mg | ORAL_TABLET | Freq: Every day | ORAL | Status: DC

## 2019-11-24 MED ORDER — MAGNESIUM SULFATE 2 GM/50ML IV SOLN
2.0000 g | Freq: Once | INTRAVENOUS | Status: AC
  Administered 2019-11-24: 2 g via INTRAVENOUS
  Filled 2019-11-24: qty 50

## 2019-11-24 MED ORDER — AMLODIPINE BESYLATE 5 MG PO TABS
2.5000 mg | ORAL_TABLET | Freq: Every day | ORAL | Status: DC
  Administered 2019-11-24 – 2019-11-27 (×4): 2.5 mg via ORAL
  Filled 2019-11-24 (×4): qty 1

## 2019-11-24 NOTE — Progress Notes (Signed)
Patient is on room air and O2Sat is low 19' patient c/o chest tightness. O2 Hight flow 3L applied and O2 Sat at 95 and patient states she feels a relieve from her chest tightness. Will continue to monitor patient.

## 2019-11-24 NOTE — Telephone Encounter (Signed)
-----   Message from Candee Furbish, MD sent at 11/24/2019 10:16 AM EDT ----- Regarding: Ander Slade f/u 2 weeks with Lucinda Dell or NP  Thanks Linna Hoff

## 2019-11-24 NOTE — Progress Notes (Signed)
PROGRESS NOTE    [name]  EXH:[id number] DOB: [birth year] DOA: 11/10/2019 PCP: [name], MD      Brief Narrative:  [name] is a 74 y.o. 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  Admitted and started on broad-spectrum antibiotics.            Assessment & Plan:  Septic shock due to pyelonephritis E. coli bacteremia Blood cultures on admission growing E coli, pan-sensitive.  Completed 9 days cefepime.  Repeat cultures 3/7 no growth.    Remains afebrile, hemodynamically stable. -Monitor fever curve    Acute kidney injury secondary to ATN Previous baseline Cr 0.9.  Developed ATN here, Cr peaked 4.11, slowly improved.  Now UOP good, Cr stable 1.3-1.5  No evidence of fluid overload.  Acute hypoxic respiratory failure Acute on chronic diastolic CHF, resolved Idiopathic pulmonary fibrosis COPD Follows with Dr. Melvyn Novas and Ander Slade for IPF.    After admission for E coli sepsis, developed acute respiratory distress.  Peak O2 need 15L on 3/8.  Thought to be fluid overload superimposed on IPF  Diuresed now to the extent possible.  Steroids started over the weekend. -Continue prednisone -Consult pulmonology, appreciate expert recommendations  -Continue Dulera -Continue PRN Duo-neb -Continue Pepcid  Hypokalemia Hypomagnesemia Repleted  Anemia of chronic disease Hgb stable, no clinical bleeding  Hypertension Peripheral vascular disease, secondary prevention Blood pressure somewhat elevated -Resume home amlodipine, low dose  Mood disorder -Continue clonazepam, duloxetine, lamotrigine  History of VTE -Continue Xarelto  Restless leg -Continue ropinirole  Moderate protein calorie malnutrition As evidenced by severe critical illness, decreased muscle mass intact, decreased oral intake -Consult dietitian -Continue Ensure  IBS -Continue  Linzess  Mediastinal lymphadenopathy Incidental finding on CT chest -Follow-up CT chest with contrast in 3 months, on or around June 7       Disposition: Patient was admitted with septic shock and has subsequent acute renal failure and respiratory failure.  Her renal function is gradually improving, but is now stabilized with CKD stage III-IV.  Her shock has resolved.  At this point she has continued respiratory failure, which appears to be gradually improving now with prednisone.  We will continue prednisone, chest physiotherapy.  She will need extensive rehabilitation, as at present she requires assistance even for bed mobility, and she was fully independent prior to admission.          MDM: The below labs and imaging reports reviewed and summarized above.  Medication management as above.     DVT prophylaxis: Not applicable, on Xarelto Code Status: Full code Family Communication: Son by phone    Consultants:   Critical care  Nephrology  Procedures:   3/2 CT abdomen and pelvis without contrast-no acute intra-abdominal pathology, pulmonary fibrosis in the lung bases, similar to prior studies  3/8 intubation  3/8 CT chest without contrast new severe patchy groundglass opacity consolidation and septal thickening throughout both lungs most prominent in the upper lobes, differential is edema, multilobar pneumonia, diffuse alveolar hemorrhage  3/8 bronchoscopy-no evidence of hemorrhage BAL obtained, no biopsy  3/9Echocardiogram-normal EF, grade 1 diastolic dysfunction, no significant valvular disease  3/9 extubation  3/9 renal ultrasound-unremarkable  Antimicrobials:   Ceftriaxone 3/1>>3/7  Cefepime 3/8>> 3/12  Culture data:   3/1 blood culture  3/7 blood culture x2-no growth   3/7 urine culture-multiple species          Subjective: Patient still feeling  quite tired, but her breathing is improving gradually.  She is still extremely debilitated,  weak, unable to stand by herself.  No fever, vomiting.        Objective: Vitals:   11/23/19 0811 11/23/19 2027 11/23/19 2353 11/24/19 0321  BP: 134/82 128/61 (!) 144/71 (!) 153/71  Pulse: 78 81 73 87  Resp: _0 Temp: 98.2 F (36.8 C) 98.6 F (37 C) 98.3 F (36.8 C) 98.6 F (37 C)  TempSrc: Oral Oral Oral Oral  SpO2: 98% 96% 92% 98%  Weight:      Height:        Intake/Output Summary (Last 24 hours) at 11/24/2019 0815 Last data filed at 11/24/2019 0500 Gross per 24 hour  Intake 360 ml  Output 2100 ml  Net -1740 ml   Filed Weights   11/11/19 0010  Weight: 71.7 kg    Examination: General appearance: Frail elderly adult female, alert and in no acute distress.   HEENT: Anicteric, conjunctiva pink, lids and lashes normal. No nasal deformity, discharge, epistaxis.  Lips moist, edentulous.  OP moist, no oral lesions.   Skin: Warm and dry.  No suspicious rashes or lesions. Cardiac: RRR, no murmurs appreciated.  No LE edema.    Respiratory: Normal respiratory rate and rhythm.  CTAB without rales or wheezes.  Diminished bilaterally. Abdomen: Abdomen soft.  No tenderness to palpation or guarding.. No ascites, distension, hepatosplenomegaly.   MSK: No deformities or effusions of the large joints of the upper or lower extremities bilaterally.  Diffuse loss of subcutaneous muscle mass and fat. Neuro: Awake and alert. Naming is grossly intact, and the patient's recall, recent and remote, as well as general fund of knowledge seem within normal limits.  Muscle tone diminished, without fasciculations.  Moves all extremities equally and with normal coordination with severe generalized weakness.  Speech fluent.    Psych: Sensorium intact and responding to questions, attention normal. Affect normal.  Judgment and insight appear normal.        Data Reviewed: I have personally reviewed following labs and imaging studies:  CBC: Recent Labs  Lab 11/18/19 0515 11/18/19 0515  11/19/19 0548 11/19/19 0548 11/20/19 0346 11/21/19 0353 11/22/19 0244 11/23/19 0536 11/23/19 1957  WBC 15.5*   < > 20.3*   < > 16.2* 15.6* 16.8* 17.4* 12.1*  NEUTROABS 14.3*  --  18.0*  --  14.2*  --   --   --  10.2*  HGB 9.4*   < > 9.4*   < > 9.1* 10.2* 10.0* 10.7* 10.6*  HCT 27.5*   < > 27.1*   < > 26.3* 30.1* 29.3* 32.1* 31.9*  MCV 84.4   < > 84.2   < > 85.4 85.0 85.4 87.5 86.9  PLT 188   < > 280   < > 301 321 311 396 411*   < > = values in this interval not displayed.   Basic Metabolic Panel: Recent Labs  Lab 11/17/19 2145 11/18/19 0515 11/20/19 0346 11/20/19 1203 11/21/19 0353 11/21/19 0353 11/21/19 1659 11/22/19 0244 11/22/19 2214 11/23/19 0536 11/24/19 0540  NA 132*   < > 136   < > 138  --  134* 134*  --  135 135  K 3.5   < > 3.2*   < > 3.3*   < > 3.4* 2.8* 4.0 3.8 3.8  CL 103   < > 101   < > 99  --  94* 94*  --  95* 91*  CO2 16*   < > 21*   < > 22  --  27 28  --  29 30  GLUCOSE 165*   < > 176*   < > 131*  --  162* 195*  --  158* 114*  BUN 45*   < > 63*   < > 60*  --  53* 52*  --  52* 47*  CREATININE 2.74*   < > 2.01*   < > 1.55*  --  1.40* 1.34*  --  1.55* 1.43*  CALCIUM 7.5*   < > 7.9*   < > 7.8*  --  7.8* 7.6*  --  8.2* 8.1*  MG 1.3*  --  1.8  --  1.6*  --   --  1.8  --  1.6*  --   PHOS  --   --  4.0  --   --   --   --   --   --   --   --    < > = values in this interval not displayed.   GFR: Estimated Creatinine Clearance: 33.8 mL/min (A) (by C-G formula based on SCr of 1.43 mg/dL (H)). Liver Function Tests: Recent Labs  Lab 11/21/19 0353 11/22/19 0244 11/23/19 0536  AST _0 ALT _1 ALKPHOS 129* 126 126  BILITOT 1.2 1.1 0.8  PROT 6.1* 6.3* 6.5  ALBUMIN 2.3* 2.3* 2.4*   No results for input(s): LIPASE, AMYLASE in the last 168 hours. No results for input(s): AMMONIA in the last 168 hours. Coagulation Profile: No results for input(s): INR, PROTIME in the last 168 hours. Cardiac Enzymes: No results for input(s): CKTOTAL, CKMB,  CKMBINDEX, TROPONINI in the last 168 hours. BNP (last 3 results) Recent Labs    03/10/19 1438  PROBNP 263   HbA1C: No results for input(s): HGBA1C in the last 72 hours. CBG: Recent Labs  Lab 11/22/19 1729  GLUCAP 179*   Lipid Profile: No results for input(s): CHOL, HDL, LDLCALC, TRIG, CHOLHDL, LDLDIRECT in the last 72 hours. Thyroid Function Tests: No results for input(s): TSH, T4TOTAL, FREET4, T3FREE, THYROIDAB in the last 72 hours. Anemia Panel: No results for input(s): VITAMINB12, FOLATE, FERRITIN, TIBC, IRON, RETICCTPCT in the last 72 hours. Urine analysis:    Component Value Date/Time   COLORURINE YELLOW 11/18/2019 0816   APPEARANCEUR CLEAR 11/18/2019 0816   LABSPEC 1.015 11/18/2019 0816   PHURINE 5.5 11/18/2019 0816   GLUCOSEU NEGATIVE 11/18/2019 0816   HGBUR SMALL (A) 11/18/2019 0816   BILIRUBINUR NEGATIVE 11/18/2019 0816   BILIRUBINUR negative 11/10/2019 1432   BILIRUBINUR neg 07/31/2013 1752   KETONESUR NEGATIVE 11/18/2019 0816   PROTEINUR 30 (A) 11/18/2019 0816   UROBILINOGEN 0.2 11/10/2019 1432   NITRITE NEGATIVE 11/18/2019 0816   LEUKOCYTESUR SMALL (A) 11/18/2019 0816   Sepsis Labs: _2 (procalcitonin:4,lacticacidven:4)  ) Recent Results (from the past 240 hour(s))  Culture, blood (routine x 2)     Status: None   Collection Time: 11/16/19 12:13 PM   Specimen: BLOOD  Result Value Ref Range Status   Specimen Description BLOOD LEFT ANTECUBITAL  Final   Special Requests   Final    BOTTLES DRAWN AEROBIC ONLY Blood Culture results may not be optimal due to an inadequate volume of blood received in culture bottles   Culture   Final    NO GROWTH 5 DAYS Performed at [facility], [place] [address] [place], [place] [address]    Report Status 11/21/2019 FINAL  Final  Culture, blood (routine x 2)     Status: None   Collection Time: 11/16/19 12:13 PM   Specimen: BLOOD  Result Value Ref Range Status   Specimen Description BLOOD LEFT ANTECUBITAL   Final   Special Requests   Final    BOTTLES DRAWN AEROBIC ONLY Blood Culture results may not be optimal due to an inadequate volume of blood received in culture bottles   Culture   Final    NO GROWTH 5 DAYS Performed at [facility], [address] [place], [place] [address]    Report Status 11/21/2019 FINAL  Final  Culture, Urine     Status: Abnormal   Collection Time: 11/16/19  1:59 PM   Specimen: Urine, Clean Catch  Result Value Ref Range Status   Specimen Description URINE, CLEAN CATCH  Final   Special Requests   Final    NONE Performed at [facility], [place] [address] [place], [place] [address]    Culture MULTIPLE SPECIES PRESENT, SUGGEST RECOLLECTION (A)  Final   Report Status 11/18/2019 FINAL  Final  Acid Fast Smear (AFB)     Status: None   Collection Time: 11/17/19  5:57 PM   Specimen: Bronchial Alveolar Lavage  Result Value Ref Range Status   AFB Specimen Processing Concentration  Final   Acid Fast Smear Negative  Final    Comment: (NOTE) Performed At: [facility] [place], [place] [id number] Rush Farmer MD WI:[id number]    Source (AFB) BRONCHIAL ALVEOLAR LAVAGE  Final    Comment: Performed at [facility], [place] [address], [place], [place] [address]  Culture, respiratory     Status: None   Collection Time: 11/17/19  5:57 PM   Specimen: Bronchoalveolar Lavage; Respiratory  Result Value Ref Range Status   Specimen Description BRONCHIAL ALVEOLAR LAVAGE  Final   Special Requests NONE  Final   Gram Stain   Final    FEW WBC PRESENT,BOTH PMN AND MONONUCLEAR NO ORGANISMS SEEN    Culture   Final    Consistent with normal respiratory flora. Performed at [facility], [place] [address] [place], [place] [address]    Report Status 11/19/2019 FINAL  Final  Fungus Culture With Stain     Status: None (Preliminary result)   Collection Time: 11/17/19  5:57 PM   Specimen: Bronchial Alveolar Lavage  Result Value Ref Range Status    Fungus Stain Final report  Final    Comment: (NOTE) Performed At: [facility] [place], [place] [id number] Rush Farmer MD OI:[id number]    Fungus (Mycology) Culture PENDING  Incomplete   Fungal Source BRONCHIAL ALVEOLAR LAVAGE  Final    Comment: Performed at [facility], [place] [address] [place], [place] [address]  Pneumocystis smear by DFA     Status: None   Collection Time: 11/17/19  5:57 PM   Specimen: Bronchoalveolar Lavage; Respiratory  Result Value Ref Range Status   Specimen Source-PJSRC BRONCHIAL ALVEOLAR LAVAGE  Final   Pneumocystis jiroveci Ag NEGATIVE  Final    Comment: Performed at [facility] Performed at [facility], [address] [place], [place] [address]   Fungus Culture Result     Status: None   Collection Time: 11/17/19  5:57 PM  Result Value Ref Range Status   Result 1 Comment  Final    Comment: (NOTE) KOH/Calcofluor preparation:  no fungus observed. Performed At: [facility] [id number]  [place], [place] [id number] Rush Farmer MD EK:[id number]   MRSA PCR Screening     Status: None   Collection Time: 11/18/19  1:12 PM   Specimen: Nasal Mucosa; Nasopharyngeal  Result Value Ref Range Status   MRSA by PCR NEGATIVE NEGATIVE Final    Comment:        The GeneXpert MRSA Assay (FDA approved for NASAL specimens only), is one component of a comprehensive MRSA colonization surveillance program. It is not intended to diagnose MRSA infection nor to guide or monitor treatment for MRSA infections. Performed at [facility], [place] [address] [place], [place] [address]          Radiology Studies: No results found.      Scheduled Meds: . chlorhexidine gluconate (MEDLINE KIT)  15 mL Mouth Rinse BID  . Chlorhexidine Gluconate Cloth  6 each Topical Daily  . DULoxetine  30 mg Oral QPM  . famotidine  20 mg Oral QHS  . feeding supplement (ENSURE ENLIVE)  237 mL Oral BID BM  . lamoTRIgine   75 mg Oral Daily  . linaclotide  145 mcg Oral QAC breakfast  . mouth rinse  15 mL Mouth Rinse 10 times per day  . Melatonin  18 mg Oral QHS  . mometasone-formoterol  2 puff Inhalation BID  . multivitamin with minerals  1 tablet Oral Daily  . predniSONE  40 mg Oral Q breakfast  . rivaroxaban  2.5 mg Oral BID  . rOPINIRole  3 mg Oral QHS   Continuous Infusions: . sodium chloride Stopped (11/19/19 1859)     LOS: 13 days    Time spent: 25 minutes    Edwin Dada, MD [facility] 11/24/2019, 8:15 AM     Please page though [place] or Epic secure chat:  For [employer], Adult nurse

## 2019-11-24 NOTE — Progress Notes (Signed)
Inpatient Rehabilitation Admissions Coordinator  I met with patient at bedside to clarify her supports at home. With her permission I contacted her son, [name], by phone. Patient currently has no assist at home, with elderly brother in law next door. [name] feels SNF rehab will be needed to give her opportunity for prolonged rehab or other arrangements can be made. I have dicussed with SW, Shanita and Dr. Danford. We will sign off at this time and recommend SNF rehab.   , RN, MSN Rehab Admissions Coordinator [phone] 11/24/2019 12:25 PM  

## 2019-11-24 NOTE — Progress Notes (Signed)
Occupational Therapy Treatment Patient Details Name: [name] MRN: [id number] DOB: May 26, 1946 Today's Date: 11/24/2019    History of present illness [name] is a 74yo female with a PMH significant for prior CVA, PAD, meningioma, hypothyroidism, hypertension, GERD, depression, bipolar disease, and anemia who presented to the ED on 3/1 with complaints of dysuria, urinary frequency, suprapubic abdominal pain, left-sided flank pain, and chills that began 1 week prior to admission. Pt with diagnosis of complicated UTI, with increasing AMS. Ct chest revealed ground glass opacities and mild cardiomegaly concerning for flash pulmonary edema, vs PNA, vs diffuse alveolar hemorrhage. ETT 3/8-3/9.   OT comments  Pt making progress in therapy, demonstrating improved independence with functional transfer tasks this date. Pt required min encouragement to participate in therapy tasks. Pt tolerated sitting EOB ~10 min, noting 0 instances of LOB. Educated pt on safety strategies and fall prevention with fair understanding and poor follow through. Pt declined ambulating to bathroom or completing grooming tasks at the sink due to fatigue and nausea. Pt transferred to bedside chair with RW and min assist for balance and safety. Pt completed grooming/hygiene in sitting requiring min redirection back to tasks. Pt appears more alert and oriented this date able to follow cues more consistently. OT will continue to follow acutely. Continue to recommend CIR placement for additional rehab prior to discharge home.    Follow Up Recommendations  CIR;Supervision/Assistance - 24 hour    Equipment Recommendations  Other (comment)(TBD)    Recommendations for Other Services      Precautions / Restrictions Precautions Precautions: Fall Restrictions Weight Bearing Restrictions: No       Mobility Bed Mobility Overal bed mobility: Needs Assistance Bed Mobility: Supine to Sit     Supine to sit: Supervision;HOB  elevated     General bed mobility comments: Cues for safety  Transfers Overall transfer level: Needs assistance Equipment used: Rolling walker (2 wheeled) Transfers: Sit to/from [facility] Sit to Stand: Min assist Stand pivot transfers: Min assist       General transfer comment: Min assist for balance and safety. Cues for safety with RW.     Balance Overall balance assessment: Needs assistance Sitting-balance support: Feet supported Sitting balance-Leahy Scale: Fair       Standing balance-Leahy Scale: Poor                             ADL either performed or assessed with clinical judgement   ADL Overall ADL's : Needs assistance/impaired     Grooming: Wash/dry hands;Wash/dry face;Brushing hair;Set up;Supervision/safety;Sitting                               Functional mobility during ADLs: Minimal assistance;Rolling walker General ADL Comments: Pt declined ambulating to/from bathroom or standing at the sink this date due to fatigue and nausea. Pt agreeable to sitting in bedside chair.      Vision       Perception     Praxis      Cognition Arousal/Alertness: Awake/alert Behavior During Therapy: WFL for tasks assessed/performed Overall Cognitive Status: No family/caregiver present to determine baseline cognitive functioning                                 General Comments: Pt appears more alert and oriented this date. Pt able to follow  cues, requiring min redirection back to tasks. Pt required min to mod cues for safety.        Exercises     Shoulder Instructions       General Comments BP semi-reclined in bed: 97/14mmHg. BP sitting EOB: 117/15mmHg. BP after transfer to chair: 129/38mmHg. Pt on 4.5L [place] with SpO2 maintaining in 90s throughout.     Pertinent Vitals/ Pain       Pain Assessment: No/denies pain  Home Living                                          Prior  Functioning/Environment              Frequency           Progress Toward Goals  OT Goals(current goals can now be found in the care plan section)  Progress towards OT goals: Progressing toward goals  ADL Goals Pt Will Perform Grooming: sitting;standing;with supervision Pt Will Perform Lower Body Bathing: with supervision;sitting/lateral leans;sit to/from stand Pt Will Perform Upper Body Dressing: with modified independence;sitting Pt Will Perform Lower Body Dressing: with supervision;sitting/lateral leans;sit to/from stand Pt Will Transfer to Toilet: with supervision;ambulating Pt Will Perform Toileting - Clothing Manipulation and hygiene: with supervision;sit to/from stand;sitting/lateral leans Additional ADL Goal #1: Pt will sustain attention to ADL task >5 min with no more than min cueing. Additional ADL Goal #2: Pt will demostrate anticipatory awareness during functional task.  Plan Discharge plan remains appropriate    Co-evaluation                 AM-PAC OT "6 Clicks" Daily Activity     Outcome Measure   Help from another person eating meals?: None Help from another person taking care of personal grooming?: A Little Help from another person toileting, which includes using toliet, bedpan, or urinal?: A Little Help from another person bathing (including washing, rinsing, drying)?: A Lot Help from another person to put on and taking off regular upper body clothing?: A Little Help from another person to put on and taking off regular lower body clothing?: A Little 6 Click Score: 18    End of Session Equipment Utilized During Treatment: Rolling walker;Oxygen  OT Visit Diagnosis: Unsteadiness on feet (R26.81);Muscle weakness (generalized) (M62.81);Other symptoms and signs involving cognitive function   Activity Tolerance Patient limited by fatigue;Other (comment)(Limited by nausea)   Patient Left in chair;with call bell/phone within reach;with chair alarm  set;with nursing/sitter in room   Nurse Communication Mobility status        Time: [phone] OT Time Calculation (min): 29 min  Charges: OT General Charges $OT Visit: 1 Visit OT Treatments $Self Care/Home Management : 8-22 mins $Therapeutic Activity: 8-22 mins  Mauri Brooklyn OTR/L [phone]   Mauri Brooklyn 11/24/2019, 9:37 AM

## 2019-11-24 NOTE — Progress Notes (Signed)
   NAME:  [name], MRN:  [id number], DOB:  [birth year], LOS: 73 ADMISSION DATE:  11/10/2019, CONSULTATION DATE:  11/17/2019 REFERRING MD:  Dr. Horris Latino, CHIEF COMPLAINT:  Respiratory distress   Brief History   74yo female admitted with sepsis secondary to left pyelonephritis 3/1, PCCM consulted 3/8 due to increasing oxygen demand and AMS.    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  Chest CTA 3/8  Patchy bilateral groundglass opacities, consolidation mostly in the upper lobe, nonspecific lymphadenopathy  Micro Data:  COVID 3/2 > negative  Blood culture 3/1 > Enterobacter and E.coli  Blood culture 3/7 >   Urine culture 3/7 > multiple species BAL 3/9 >   Antimicrobials:  Ceftriaxone 3/2 > 3/8 Cefepime 3/8 > 3/12 Vancomycin 3/8 > off  Interim history/subjective:  Continues to improve. Anxious to get home. O2 requirements improving.  Objective   Blood pressure 129/79, pulse 87, temperature 98.2 F (36.8 C), temperature source Oral, resp. rate 18, height _0  (1.727 m), weight 71.7 kg, SpO2 95 %.        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  Bronch/BAL benign.  Responding to steroids.  - 5 week prednisone taper as ordered - Wean O2 to maintain sats > 88% - Probably will need O2 at home for exertion and at night prior to DC - Would keep her on 77m lasix PO daily - Progressive mobility, IS/flutter - Will have her see Dr. OAnder Sladein 2 weeks in clinic, hopefully she can go home soon - Will sign off, call if further questions or concerns  DErskine EmeryMD PCCM

## 2019-11-24 NOTE — Telephone Encounter (Signed)
Called pt no answer I was able to leave a VM this time. I will try again.

## 2019-11-24 NOTE — Telephone Encounter (Signed)
AO does not have any availability past next week. Patient has been scheduled with Beth on 12/08/19 at 130pm.

## 2019-11-25 LAB — CBC
HCT: 34.9 % — ABNORMAL LOW (ref 36.0–46.0)
Hemoglobin: 11.1 g/dL — ABNORMAL LOW (ref 12.0–15.0)
MCH: 29 pg (ref 26.0–34.0)
MCHC: 31.8 g/dL (ref 30.0–36.0)
MCV: 91.1 fL (ref 80.0–100.0)
Platelets: 416 10*3/uL — ABNORMAL HIGH (ref 150–400)
RBC: 3.83 MIL/uL — ABNORMAL LOW (ref 3.87–5.11)
RDW: 15.2 % (ref 11.5–15.5)
WBC: 13.6 10*3/uL — ABNORMAL HIGH (ref 4.0–10.5)
nRBC: 0 % (ref 0.0–0.2)

## 2019-11-25 LAB — SARS CORONAVIRUS 2 (TAT 6-24 HRS): SARS Coronavirus 2: NEGATIVE

## 2019-11-25 LAB — BASIC METABOLIC PANEL
Anion gap: 16 — ABNORMAL HIGH (ref 5–15)
BUN: 44 mg/dL — ABNORMAL HIGH (ref 8–23)
CO2: 27 mmol/L (ref 22–32)
Calcium: 8.5 mg/dL — ABNORMAL LOW (ref 8.9–10.3)
Chloride: 91 mmol/L — ABNORMAL LOW (ref 98–111)
Creatinine, Ser: 1.26 mg/dL — ABNORMAL HIGH (ref 0.44–1.00)
GFR calc Af Amer: 48 mL/min — ABNORMAL LOW (ref >60)
GFR calc non Af Amer: 41 mL/min — ABNORMAL LOW (ref >60)
Glucose, Bld: 155 mg/dL — ABNORMAL HIGH (ref 70–99)
Potassium: 4.2 mmol/L (ref 3.5–5.1)
Sodium: 134 mmol/L — ABNORMAL LOW (ref 135–145)

## 2019-11-25 NOTE — Progress Notes (Signed)
The chaplain responded to consult for spiritual care. The patient expressed anxiety about losing control of their life. They expressed issues with managing their estate, their health and their mind. The patient also discussed losing their faith community and not being able to find one here in [place]. The chaplain provided an empathetic ear to the patient and was present.  Brion Aliment Chaplain Resident For questions concerning this note please contact me by pager [phone]

## 2019-11-25 NOTE — TOC Initial Note (Signed)
Transition of Care [facility]) - Initial/Assessment Note    Patient Details  Name: [name] MRN: [id number] Date of Birth: [birth year]  Transition of Care [facility]) CM/SW Contact:    Atilano Median, LCSW Phone Number: 11/25/2019, 2:51 PM  Clinical Narrative:                  Admitted with medical history significant of AAA, bipolar disorder, cerebrovascular disease, CAD, depression, GERD, gastritis, hypertension, hypothyroidism, meningioma, PAD, psoriasis, CVA, and conditions listed below being sent to the ED by her PCP for evaluation of severe UTI symptoms and chills.  CSW spoke with patient's son [name] [phone] to discuss discharge plans. PT is recommending CIR, however patient does not have any support at home due to son living in [place] and husband is now deceased.   Patient and son are now agreeable to SNF. List of available facilities have been sent to patient's son [name] to review. [name] states that he will review the list and get back with CSW on which facility he chooses.   CSW has requested a COVID be collected anticipating discharge on 11/26/19.   TOC will continue to follow.  Expected Discharge Plan: Skilled Nursing Facility Barriers to Discharge: Ship broker, Continued Medical Work up, No SNF bed   Patient Goals and CMS Choice Patient states their goals for this hospitalization and ongoing recovery are:: get strong enough to get home CMS Medicare.gov Compare Post Acute Care list provided to:: Patient Represenative (must comment)([name] Danielle Rankin) Choice offered to / list presented to : Adult Children  Expected Discharge Plan and Services Expected Discharge Plan: [place] In-house Referral: Clinical Social Work   Post Acute Care Choice: [place] Living arrangements for the past 2 months: [place]                                      Prior Living Arrangements/Services Living arrangements for the past 2  months: Single Family Home Lives with:: Self   Do you feel safe going back to the place where you live?: No   requires more care than patient can be given in the home  Need for Family Participation in Patient Care: Yes (Comment) Care giver support system in place?: Yes (comment)      Activities of Daily Living   ADL Screening (condition at time of admission) Patient's cognitive ability adequate to safely complete daily activities?: Yes Is the patient deaf or have difficulty hearing?: No Does the patient have difficulty seeing, even when wearing glasses/contacts?: No Does the patient have difficulty concentrating, remembering, or making decisions?: No Patient able to express need for assistance with ADLs?: Yes Does the patient have difficulty dressing or bathing?: No Independently performs ADLs?: Yes (appropriate for developmental age) Does the patient have difficulty walking or climbing stairs?: No  Permission Sought/Granted Permission sought to share information with : Family Supports Permission granted to share information with : Yes, Verbal Permission Granted  Share Information with NAME: [name]-son     Permission granted to share info w Relationship: son     Emotional Assessment       Orientation: : Oriented to Self, Oriented to Place, Oriented to  Time, Oriented to Situation, Fluctuating Orientation (Suspected and/or reported [place])      Admission diagnosis:  Lower urinary tract infectious disease [N39.0] UTI (urinary tract infection) [N39.0] Renal insufficiency [N28.9] Patient Active Problem List  Diagnosis Date Noted  . Bilateral pulmonary infiltrates on CXR   . Acute hypoxemic respiratory failure ([place])   . Complicated UTI (urinary tract infection) 11/11/2019  . AKI (acute kidney injury) ([place]) 11/11/2019  . Hyponatremia 11/11/2019  . Elevated LFTs 11/11/2019  . Sepsis ([place]) 11/11/2019  . Wound infection 11/07/2018  . Postoperative pain   . Sleep  disturbance   . Tobacco abuse   . Acute blood loss anemia   . Hypoalbuminemia due to protein-calorie malnutrition ([place])   . Debility 10/24/2018  . Pre-operative cardiovascular examination 09/26/2018  . HLD (hyperlipidemia) 09/26/2018  . GAD (generalized anxiety disorder) 08/07/2018  . Major depressive disorder, single episode 08/07/2018  . Appendiceal abscess 07/08/2018  . PVD (peripheral vascular disease) ([place]) 03/15/2018  . Atherosclerosis of native artery of left lower extremity with intermittent claudication ([place]) 03/15/2018  . Chronic left-sided low back pain with left-sided sciatica 12/25/2017  . Facial droop   . History of CVA (cerebrovascular accident) 10/23/2017  . Critical lower limb ischemia 11/08/2016  . Smoker 11/08/2016  . Postinflammatory pulmonary fibrosis ([place]) 02/14/2016  . Cigarette smoker 12/24/2015  . Hypothyroid ? 01/16/2014  . Mild cognitive impairment 01/16/2014  . Meningioma ([place]) 10/21/2013  . Chest pain 10/20/2013  . Medicare annual wellness visit, subsequent 06/16/2013  . Dizziness and giddiness 06/16/2013  . RLS (restless legs syndrome) 04/29/2012  . Abdominal pain 12/27/2010  . DEGENERATIVE JOINT DISEASE 09/23/2010  . CAROTID ARTERY DISEASE 08/15/2010  . CHEST PAIN 08/15/2010  . HEADACHE 12/02/2009  . BACK PAIN 11/22/2009  . CONSTIPATION, CHRONIC 01/29/2008  . NAUSEA 01/28/2008  . DEPRESSION 03/08/2007  . HTN (hypertension) 03/08/2007   PCP:  [name], MD Pharmacy:   [facility] [phone] [address], [place] [address] [place] [address] Phone: [phone] Fax: [phone]     Social Determinants of Health (SDOH) Interventions    Readmission Risk Interventions No flowsheet data found.

## 2019-11-25 NOTE — NC FL2 (Signed)
[place] MEDICAID FL2 LEVEL OF CARE SCREENING TOOL     IDENTIFICATION  Patient Name: [name] Birthdate: 03-Aug-1946 Sex: female Admission Date (Current Location): 11/10/2019  [facility] [place] Number:  Herbalist and Address:  [facility] [place], [place] [address] [place], [place] [address]      Provider Number: [id number]  Attending Physician Name and Address:  Edwin Dada, *  Relative Name and Phone Number:       Current Level of Care: Hospital Recommended Level of Care: [place] Prior Approval Number:    Date Approved/Denied:   PASRR Number: [id number]  Discharge Plan:      Current Diagnoses: Patient Active Problem List   Diagnosis Date Noted  . Bilateral pulmonary infiltrates on CXR   . Acute hypoxemic respiratory failure ([place])   . Complicated UTI (urinary tract infection) 11/11/2019  . AKI (acute kidney injury) ([place]) 11/11/2019  . Hyponatremia 11/11/2019  . Elevated LFTs 11/11/2019  . Sepsis ([place]) 11/11/2019  . Wound infection 11/07/2018  . Postoperative pain   . Sleep disturbance   . Tobacco abuse   . Acute blood loss anemia   . Hypoalbuminemia due to protein-calorie malnutrition ([place])   . Debility 10/24/2018  . Pre-operative cardiovascular examination 09/26/2018  . HLD (hyperlipidemia) 09/26/2018  . GAD (generalized anxiety disorder) 08/07/2018  . Major depressive disorder, single episode 08/07/2018  . Appendiceal abscess 07/08/2018  . PVD (peripheral vascular disease) ([place]) 03/15/2018  . Atherosclerosis of native artery of left lower extremity with intermittent claudication ([place]) 03/15/2018  . Chronic left-sided low back pain with left-sided sciatica 12/25/2017  . Facial droop   . History of CVA (cerebrovascular accident) 10/23/2017  . Critical lower limb ischemia 11/08/2016  . Smoker 11/08/2016  . Postinflammatory pulmonary fibrosis ([place]) 02/14/2016  . Cigarette smoker 12/24/2015  .  Hypothyroid ? 01/16/2014  . Mild cognitive impairment 01/16/2014  . Meningioma ([place]) 10/21/2013  . Chest pain 10/20/2013  . Medicare annual wellness visit, subsequent 06/16/2013  . Dizziness and giddiness 06/16/2013  . RLS (restless legs syndrome) 04/29/2012  . Abdominal pain 12/27/2010  . DEGENERATIVE JOINT DISEASE 09/23/2010  . CAROTID ARTERY DISEASE 08/15/2010  . CHEST PAIN 08/15/2010  . HEADACHE 12/02/2009  . BACK PAIN 11/22/2009  . CONSTIPATION, CHRONIC 01/29/2008  . NAUSEA 01/28/2008  . DEPRESSION 03/08/2007  . HTN (hypertension) 03/08/2007    Orientation RESPIRATION BLADDER Height & Weight     Self, Time, Situation, Place  O2 Continent Weight: 158 lb 1.1 oz (71.7 kg) Height:  _0  (172.7 cm)  BEHAVIORAL SYMPTOMS/MOOD NEUROLOGICAL BOWEL NUTRITION STATUS      Continent Diet(see discharge summary)  AMBULATORY STATUS COMMUNICATION OF NEEDS Skin   Extensive Assist Verbally Normal, Surgical wounds                       Personal Care Assistance Level of Assistance  Bathing, Feeding, Dressing Bathing Assistance: Maximum assistance Feeding assistance: Limited assistance Dressing Assistance: Maximum assistance     Functional Limitations Info             SPECIAL CARE FACTORS FREQUENCY  PT (By licensed PT), OT (By licensed OT)     PT Frequency: 5 times a week OT Frequency: 5 times a week            Contractures      Additional Factors Info  Code Status Code Status Info: Full  Current Medications (11/25/2019):  This is the current hospital active medication list Current Facility-Administered Medications  Medication Dose Route Frequency Provider Last Rate Last Admin  . 0.9 %  sodium chloride infusion   Intravenous PRN Johnsie Cancel, NP   Stopped at 11/19/19 1859  . acetaminophen (TYLENOL) tablet 650 mg  650 mg Oral Q6H PRN Shela Leff, MD   650 mg at 11/21/19 0810  . amLODipine (NORVASC) tablet 2.5 mg  2.5 mg Oral Daily Danford,  Suann Larry, MD   2.5 mg at 11/25/19 1020  . chlorhexidine gluconate (MEDLINE KIT) (PERIDEX) 0.12 % solution 15 mL  15 mL Mouth Rinse BID Jennelle Human B, NP   15 mL at 11/25/19 0845  . Chlorhexidine Gluconate Cloth 2 % PADS 6 each  6 each Topical Daily Merlene Laughter F, NP   6 each at 11/25/19 1021  . clonazePAM (KLONOPIN) tablet 0.5 mg  0.5 mg Oral BID PRN Shela Leff, MD   0.5 mg at 11/25/19 0452  . DULoxetine (CYMBALTA) DR capsule 30 mg  30 mg Oral QPM Alma Friendly, MD   30 mg at 11/24/19 1711  . famotidine (PEPCID) tablet 20 mg  20 mg Oral QHS Shela Leff, MD   20 mg at 11/24/19 0521  . feeding supplement (ENSURE ENLIVE) (ENSURE ENLIVE) liquid 237 mL  237 mL Oral BID BM Collene Gobble, MD   237 mL at 11/23/19 1045  . furosemide (LASIX) tablet 40 mg  40 mg Oral Daily Candee Furbish, MD   40 mg at 11/25/19 1019  . guaiFENesin-codeine 100-10 MG/5ML solution 5 mL  5 mL Oral Q6H PRN Danford, Suann Larry, MD      . HYDROcodone-acetaminophen (NORCO/VICODIN) 5-325 MG per tablet 1-2 tablet  1-2 tablet Oral Q4H PRN Alma Friendly, MD   1 tablet at 11/20/19 2140  . ipratropium-albuterol (DUONEB) 0.5-2.5 (3) MG/3ML nebulizer solution 3 mL  3 mL Nebulization Q4H PRN Alma Friendly, MD      . lamoTRIgine (LAMICTAL) tablet 75 mg  75 mg Oral Daily Shela Leff, MD   75 mg at 11/25/19 1019  . linaclotide (LINZESS) capsule 145 mcg  145 mcg Oral QAC breakfast Shela Leff, MD   145 mcg at 11/25/19 0806  . MEDLINE mouth rinse  15 mL Mouth Rinse 10 times per day Jennelle Human B, NP   15 mL at 11/25/19 0619  . Melatonin TABS 18 mg  18 mg Oral QHS Shela Leff, MD   18 mg at 11/22/19 2209  . mometasone-formoterol (DULERA) 100-5 MCG/ACT inhaler 2 puff  2 puff Inhalation BID Alma Friendly, MD   2 puff at 11/25/19 0806  . multivitamin with minerals tablet 1 tablet  1 tablet Oral Daily Collene Gobble, MD   1 tablet at 11/24/19 0934  . ondansetron  (ZOFRAN) injection 4 mg  4 mg Intravenous Q6H PRN Shela Leff, MD   4 mg at 11/23/19 1212  . ondansetron (ZOFRAN) tablet 4 mg  4 mg Oral Q8H PRN Alma Friendly, MD   4 mg at 11/24/19 0936  . polyethylene glycol (MIRALAX / GLYCOLAX) packet 17 g  17 g Oral Daily PRN Amin, Ankit Chirag, MD      . predniSONE (DELTASONE) tablet 30 mg  30 mg Oral Q breakfast Candee Furbish, MD   30 mg at 11/25/19 0805   Followed by  . [START ON 12/02/2019] predniSONE (DELTASONE) tablet 20 mg  20 mg Oral Q breakfast Tamala Julian,  Darnelle Maffucci, MD       Followed by  . [START ON 12/09/2019] predniSONE (DELTASONE) tablet 10 mg  10 mg Oral Q breakfast Candee Furbish, MD      . rivaroxaban Alveda Reasons) tablet 2.5 mg  2.5 mg Oral BID Mannam, Praveen, MD   2.5 mg at 11/25/19 1019  . rOPINIRole (REQUIP) tablet 3 mg  3 mg Oral QHS Shela Leff, MD   3 mg at 11/24/19 0524  . senna-docusate (Senokot-S) tablet 2 tablet  2 tablet Oral QHS PRN Damita Lack, MD   2 tablet at 11/24/19 0936  . senna-docusate (Senokot-S) tablet 2 tablet  2 tablet Oral Once Danford, Christopher P, MD      . sodium chloride flush (NS) 0.9 % injection 10-40 mL  10-40 mL Intracatheter PRN Amin, Jeanella Flattery, MD         Discharge Medications: Please see discharge summary for a list of discharge medications.  Relevant Imaging Results:  Relevant Lab Results:   Additional Information [id number]  Atilano Median, LCSW

## 2019-11-25 NOTE — Progress Notes (Signed)
Physical Therapy Treatment Patient Details Name: [name] MRN: [id number] DOB: [birth year] Today's Date: 11/25/2019    History of Present Illness [name] is a 74yo female with a PMH significant for prior CVA, PAD, meningioma, hypothyroidism, hypertension, GERD, depression, bipolar disease, and anemia who presented to the ED on 3/1 with complaints of dysuria, urinary frequency, suprapubic abdominal pain, left-sided flank pain, and chills that began 1 week prior to admission. Pt with diagnosis of complicated UTI, with increasing AMS. Ct chest revealed ground glass opacities and mild cardiomegaly concerning for flash pulmonary edema, vs PNA, vs diffuse alveolar hemorrhage. ETT 3/8-3/9.    PT Comments    Pt is on 1.5L of supp O2. Resting SpO2 is 95% and during functional mobility SpO2 between 91-93%. Pt is anxious to get up and move especially with ambulation. Pt stands and stand pivots min guard. MD enters room and provides continued encouragement and importance of ambulation. Pt willing to ambulate with PT with added encouragement. Pt ambulates min guard with RW 8' and reports "I need to sit down, I'm so weak and SOB." Pt appears minimally SOB and presented with safe mobility however anxiety tends to limit pt's mobility. Pt is encouraged to take another short bout of ambulation. Pt transfers on and off of the commode min guard and remains upright in recliner. D/C plans remain appropriate however slow progression. PT will continue to follow pt acutely.    Follow Up Recommendations  CIR     Equipment Recommendations  None recommended by PT       Precautions / Restrictions Precautions Precautions: Fall Restrictions Weight Bearing Restrictions: No    Mobility  Bed Mobility Overal bed mobility: Modified Independent Bed Mobility: Supine to Sit     Supine to sit: HOB elevated;Modified independent (Device/Increase time)     General bed mobility comments: increased time, HOB  elevated  Transfers Overall transfer level: Needs assistance Equipment used: Rolling walker (2 wheeled) Transfers: Sit to/from [facility] Sit to Stand: Min guard Stand pivot transfers: Min guard       General transfer comment: Min guard for balance and safety as well as line management  Ambulation/Gait Ambulation/Gait assistance: Min guard Gait Distance (Feet): 18 Feet(8', rest, another 10') Assistive device: Rolling walker (2 wheeled) Gait Pattern/deviations: Step-to pattern;Decreased step length - right;Decreased step length - left;Shuffle;Narrow base of support Gait velocity: decreased Gait velocity interpretation: 1.31 - 2.62 ft/sec, indicative of limited community ambulator General Gait Details: Pt is anxious to ambulate however with encouragement from PT and MD who visited with pt during session, pt willling to try          Balance Overall balance assessment: Needs assistance Sitting-balance support: Feet supported Sitting balance-Leahy Scale: Fair     Standing balance support: Bilateral upper extremity supported Standing balance-Leahy Scale: Fair Standing balance comment: Pt relies on UE support; can briefly stand without support                            Cognition Arousal/Alertness: Awake/alert Behavior During Therapy: WFL for tasks assessed/performed Overall Cognitive Status: No family/caregiver present to determine baseline cognitive functioning Area of Impairment: Attention;Safety/judgement;Awareness;Problem solving                   Current Attention Level: Selective Memory: Decreased recall of precautions;Decreased short-term memory Following Commands: Follows one step commands with increased time Safety/Judgement: Decreased awareness of safety;Decreased awareness of deficits Awareness: Emergent Problem Solving: Slow  processing;Requires verbal cues;Difficulty sequencing General Comments: Pt slowly improving her  ability to problem solve      Exercises Other Exercises Other Exercises: pursed lip breathing Other Exercises: Gait training- 8', 10'(requires 3 minute rest break in between)    General Comments General comments (skin integrity, edema, etc.): Pt is on 1.5L of O2. Resting SpO2 is 95%. Pt remains within 91-93% Spo2 during ambulation. Pt is anxious to ambulate and worried about her lungs/ feeling SOB.      Pertinent Vitals/Pain Pain Assessment: Faces Faces Pain Scale: Hurts a little bit Pain Location: abdomen Pain Descriptors / Indicators: Cramping;Discomfort;Pressure Pain Intervention(s): Limited activity within patient's tolerance;Monitored during session;Repositioned           PT Goals (current goals can now be found in the care plan section) Acute Rehab PT Goals Patient Stated Goal: go home and take a shower PT Goal Formulation: With patient Time For Goal Achievement: 12/03/19 Potential to Achieve Goals: Good Progress towards PT goals: Progressing toward goals    Frequency    Min 3X/week      PT Plan Current plan remains appropriate       AM-PAC PT "6 Clicks" Mobility   Outcome Measure  Help needed turning from your back to your side while in a flat bed without using bedrails?: None Help needed moving from lying on your back to sitting on the side of a flat bed without using bedrails?: A Little Help needed moving to and from a bed to a chair (including a wheelchair)?: A Little Help needed standing up from a chair using your arms (e.g., wheelchair or bedside chair)?: A Little Help needed to walk in hospital room?: A Little Help needed climbing 3-5 steps with a railing? : A Lot 6 Click Score: 18    End of Session Equipment Utilized During Treatment: Oxygen;Gait belt Activity Tolerance: Patient limited by fatigue Patient left: in chair;with chair alarm set;with call bell/phone within reach Nurse Communication: Mobility status PT Visit Diagnosis: Unsteadiness on  feet (R26.81);Muscle weakness (generalized) (M62.81);Difficulty in walking, not elsewhere classified (R26.2)     Time: [id number] PT Time Calculation (min) (ACUTE ONLY): 40 min  Charges:  $Gait Training: 8-22 mins $Therapeutic Activity: 23-37 mins                     Jodelle Green, PT, DPT Acute Rehabilitation Services Office [phone]   Jodelle Green 11/25/2019, 5:06 PM

## 2019-11-25 NOTE — Progress Notes (Signed)
PROGRESS NOTE    [name]  DIY:[id number] DOB: 1945/11/11 DOA: 11/10/2019 PCP: [name], MD      Brief Narrative:  [name] is a 74 y.o. 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  Admitted and started on broad-spectrum antibiotics.            Assessment & Plan:  Septic shock due to pyelonephritis E. coli bacteremia Blood cultures on admission growing E coli, pan-sensitive.  Completed 9 days cefepime.  Repeat cultures 3/7 no growth.    Abx stopped.  Remained afebrile, hemodynamically stable off antibiotics.     Acute kidney injury secondary to ATN Previous baseline Cr 0.9.  Developed ATN here, Cr peaked 4.11, slowly improved.  Now UOP good, Cr stable 1.2-1.4 No evidence of fluid overload.  Acute hypoxic respiratory failure Acute on chronic diastolic CHF, resolved Idiopathic pulmonary fibrosis COPD Follows with Dr. Melvyn Novas and Ander Slade for asymptomatic IPF, not on O2 at baseline.    After admission for E coli sepsis, developed acute respiratory distress.  Peak O2 need 15L on 3/8.  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  Had bronchoscopy/BAL that was unremarkable.    She underwent diuresis to the extent possible without improvement.  Steroids were started last week and she has had good response.    -Continue prednisone taper per Pulmonology, 3 weeks, follow up with Dr. Ander Slade -Continue O2 at discharge -Consult pulmonology, appreciate expert recommendations  -Continue Lasix PO daily -Continue Dulera -Continue PRN Duo-neb -Continue Pepcid  Hypokalemia Hypomagnesemia Repleted  Anemia of chronic disease Hgb stable clinical bleeding  Hypertension Peripheral vascular disease, secondary prevention Blood pressure controlled -Continue amlodipine  Mood disorder -Continue clonazepam, duloxetine,  lamotrigine  History of VTE -Continue Xarelto  Restless leg -Continue ropinirole  Moderate protein calorie malnutrition As evidenced by severe critical illness, decreased muscle mass intact, decreased oral intake -Consult dietitian -Continue Ensure  IBS -Continue Linzess  Mediastinal lymphadenopathy Incidental finding on CT chest -Follow-up CT chest with contrast in 3 months, on or around June 7       Disposition: The patient was admitted with septic shock and has subsequent acute renal failure and respiratory failure.  Her renal function is gradually improving, her shock has resolved, and her hypoxia is improving.  At this point, she has been transitioned to oral prednisone, and weaned to 1 to 2 L of oxygen.  However due to slight cognitive deficits, and severe weakness and shortness of breath with activity, she will need skilled nursing level care for rehabilitation at least 3 times per week in order to regain her prior level of function which is expected.  Discharge likely in the next 1 to 2 days when SNF found.      MDM: The below labs and imaging reports reviewed and summarized above.  Medication management as above.     DVT prophylaxis: Not applicable, on Xarelto Code Status: Full code Family Communication: Called the son by phone, no answer    Consultants:   Critical care  Nephrology  Procedures:   3/2 CT abdomen and pelvis without contrast-no acute intra-abdominal pathology, pulmonary fibrosis in the lung bases, similar to prior studies  3/8 intubation  3/8 CT chest without contrast new severe patchy groundglass opacity consolidation and septal thickening throughout both lungs most prominent in the upper lobes, differential is edema, multilobar pneumonia, diffuse  alveolar hemorrhage  3/8 bronchoscopy-no evidence of hemorrhage BAL obtained, no biopsy  3/9Echocardiogram-normal EF, grade 1 diastolic dysfunction, no significant valvular disease  3/9  extubation  3/9 renal ultrasound-unremarkable  Antimicrobials:   Ceftriaxone 3/1>>3/7  Cefepime 3/8>> 3/12  Culture data:   3/1 blood culture  3/7 blood culture x2-no growth   3/7 urine culture-multiple species          Subjective: Patient is feeling well, gradually as her breathing is somewhat better.  No fever, vomiting, confusion, chest pain.    Objective: Vitals:   11/24/19 2308 11/25/19 0806 11/25/19 0937 11/25/19 1703  BP: (!) 144/79  133/68 126/70  Pulse: 83  67 79  Resp: _0 Temp: 97.7 F (36.5 C)  97.8 F (36.6 C)   TempSrc: Oral     SpO2: 95% 92% 95% 97%  Weight:      Height:        Intake/Output Summary (Last 24 hours) at 11/25/2019 1822 Last data filed at 11/25/2019 0522 Gross per 24 hour  Intake 480 ml  Output 1500 ml  Net -1020 ml   Filed Weights   11/11/19 0010  Weight: 71.7 kg    Examination: General appearance: Elderly adult female, alert and in No acute  distress.  sitting in recliner HEENT: Anicteric, conjunctiva pink, lids and lashes normal. No nasal deformity, discharge, epistaxis.  Lips moist, partially edentulous, oropharynx moist, no oral lesions, hearing slightly diminished.    Skin: Warm and dry.  No suspicious rashes or lesions. Cardiac: RRR, no murmurs appreciated.  No LE edema.    Respiratory: Normal respiratory rate and rhythm.  CTAB without rales or wheezes.  Diminished bilaterally. Abdomen: Abdomen soft.  No tenderness palpation or guarding. No ascites, distension, hepatosplenomegaly.   MSK: No deformities or effusions of the large joints of the upper or lower extremities bilaterally. Neuro: Awake and alert. Naming is grossly intact, and the patient's recall, recent and remote, as well as general fund of knowledge seem slightly impaired.  Muscle tone diminished, without fasciculations.  Moves all extremities equally and with normal coordination, severe generalized weakness.  Speech fluent.    Psych: Sensorium  intact and responding to questions, attention normal. Affect was not.  Judgment and insight appear slightly impaired.          Data Reviewed: I have personally reviewed following labs and imaging studies:  CBC: Recent Labs  Lab 11/19/19 0548 11/19/19 0548 11/20/19 0346 11/20/19 0346 11/21/19 0353 11/22/19 0244 11/23/19 0536 11/23/19 1957 11/25/19 0850  WBC 20.3*   < > 16.2*   < > 15.6* 16.8* 17.4* 12.1* 13.6*  NEUTROABS 18.0*  --  14.2*  --   --   --   --  10.2*  --   HGB 9.4*   < > 9.1*   < > 10.2* 10.0* 10.7* 10.6* 11.1*  HCT 27.1*   < > 26.3*   < > 30.1* 29.3* 32.1* 31.9* 34.9*  MCV 84.2   < > 85.4   < > 85.0 85.4 87.5 86.9 91.1  PLT 280   < > 301   < > 321 311 396 411* 416*   < > = values in this interval not displayed.   Basic Metabolic Panel: Recent Labs  Lab 11/20/19 0346 11/20/19 1203 11/21/19 0353 11/21/19 0353 11/21/19 1659 11/21/19 1659 11/22/19 0244 11/22/19 2214 11/23/19 0536 11/24/19 0540 11/25/19 0850  NA 136   < > 138   < > 134*  --  134*  --  135 135 134*  K 3.2*   < > 3.3*   < > 3.4*   < > 2.8* 4.0 3.8 3.8 4.2  CL 101   < > 99   < > 94*  --  94*  --  95* 91* 91*  CO2 21*   < > 22   < > 27  --  28  --  _0 GLUCOSE 176*   < > 131*   < > 162*  --  195*  --  158* 114* 155*  BUN 63*   < > 60*   < > 53*  --  52*  --  52* 47* 44*  CREATININE 2.01*   < > 1.55*   < > 1.40*  --  1.34*  --  1.55* 1.43* 1.26*  CALCIUM 7.9*   < > 7.8*   < > 7.8*  --  7.6*  --  8.2* 8.1* 8.5*  MG 1.8  --  1.6*  --   --   --  1.8  --  1.6*  --   --   PHOS 4.0  --   --   --   --   --   --   --   --   --   --    < > = values in this interval not displayed.   GFR: Estimated Creatinine Clearance: 38.3 mL/min (A) (by C-G formula based on SCr of 1.26 mg/dL (H)). Liver Function Tests: Recent Labs  Lab 11/21/19 0353 11/22/19 0244 11/23/19 0536  AST _1 ALT _2 ALKPHOS 129* 126 126  BILITOT 1.2 1.1 0.8  PROT 6.1* 6.3* 6.5  ALBUMIN 2.3* 2.3* 2.4*    No results for input(s): LIPASE, AMYLASE in the last 168 hours. No results for input(s): AMMONIA in the last 168 hours. Coagulation Profile: No results for input(s): INR, PROTIME in the last 168 hours. Cardiac Enzymes: No results for input(s): CKTOTAL, CKMB, CKMBINDEX, TROPONINI in the last 168 hours. BNP (last 3 results) Recent Labs    03/10/19 1438  PROBNP 263   HbA1C: No results for input(s): HGBA1C in the last 72 hours. CBG: Recent Labs  Lab 11/22/19 1729  GLUCAP 179*   Lipid Profile: No results for input(s): CHOL, HDL, LDLCALC, TRIG, CHOLHDL, LDLDIRECT in the last 72 hours. Thyroid Function Tests: No results for input(s): TSH, T4TOTAL, FREET4, T3FREE, THYROIDAB in the last 72 hours. Anemia Panel: No results for input(s): VITAMINB12, FOLATE, FERRITIN, TIBC, IRON, RETICCTPCT in the last 72 hours. Urine analysis:    Component Value Date/Time   COLORURINE YELLOW 11/18/2019 0816   APPEARANCEUR CLEAR 11/18/2019 0816   LABSPEC 1.015 11/18/2019 0816   PHURINE 5.5 11/18/2019 0816   GLUCOSEU NEGATIVE 11/18/2019 0816   HGBUR SMALL (A) 11/18/2019 0816   BILIRUBINUR NEGATIVE 11/18/2019 0816   BILIRUBINUR negative 11/10/2019 1432   BILIRUBINUR neg 07/31/2013 1752   KETONESUR NEGATIVE 11/18/2019 0816   PROTEINUR 30 (A) 11/18/2019 0816   UROBILINOGEN 0.2 11/10/2019 1432   NITRITE NEGATIVE 11/18/2019 0816   LEUKOCYTESUR SMALL (A) 11/18/2019 0816   Sepsis Labs: _3 (procalcitonin:4,lacticacidven:4)  ) Recent Results (from the past 240 hour(s))  Culture, blood (routine x 2)     Status: None   Collection Time: 11/16/19 12:13 PM   Specimen: BLOOD  Result Value Ref Range Status   Specimen Description BLOOD LEFT ANTECUBITAL  Final   Special Requests   Final    BOTTLES  DRAWN AEROBIC ONLY Blood Culture results may not be optimal due to an inadequate volume of blood received in culture bottles   Culture   Final    NO GROWTH 5 DAYS Performed at [facility],  [place] [address] [place], [place] [address]    Report Status 11/21/2019 FINAL  Final  Culture, blood (routine x 2)     Status: None   Collection Time: 11/16/19 12:13 PM   Specimen: BLOOD  Result Value Ref Range Status   Specimen Description BLOOD LEFT ANTECUBITAL  Final   Special Requests   Final    BOTTLES DRAWN AEROBIC ONLY Blood Culture results may not be optimal due to an inadequate volume of blood received in culture bottles   Culture   Final    NO GROWTH 5 DAYS Performed at [facility], [place] [address] [place], [place] [address]    Report Status 11/21/2019 FINAL  Final  Culture, Urine     Status: Abnormal   Collection Time: 11/16/19  1:59 PM   Specimen: Urine, Clean Catch  Result Value Ref Range Status   Specimen Description URINE, CLEAN CATCH  Final   Special Requests   Final    NONE Performed at [facility], [place] [address], [place], [place] [address]    Culture MULTIPLE SPECIES PRESENT, SUGGEST RECOLLECTION (A)  Final   Report Status 11/18/2019 FINAL  Final  Acid Fast Smear (AFB)     Status: None   Collection Time: 11/17/19  5:57 PM   Specimen: Bronchial Alveolar Lavage  Result Value Ref Range Status   AFB Specimen Processing Concentration  Final   Acid Fast Smear Negative  Final    Comment: (NOTE) Performed At: [facility] [place], [place] [id number] Rush Farmer MD [id number]    Source (AFB) BRONCHIAL ALVEOLAR LAVAGE  Final    Comment: Performed at [facility], [place] [address], [place], [place] [address]  Culture, respiratory     Status: None   Collection Time: 11/17/19  5:57 PM   Specimen: Bronchoalveolar Lavage; Respiratory  Result Value Ref Range Status   Specimen Description BRONCHIAL ALVEOLAR LAVAGE  Final   Special Requests NONE  Final   Gram Stain   Final    FEW WBC PRESENT,BOTH PMN AND MONONUCLEAR NO ORGANISMS SEEN    Culture   Final    Consistent with normal respiratory flora. Performed at [facility], [place] [address] [place], [place] [address]    Report Status 11/19/2019 FINAL  Final  Fungus Culture With Stain     Status: None (Preliminary result)   Collection Time: 11/17/19  5:57 PM   Specimen: Bronchial Alveolar Lavage  Result Value Ref Range Status   Fungus Stain Final report  Final    Comment: (NOTE) Performed At: [facility] [place], [place] [id number] Rush Farmer MD [id number]    Fungus (Mycology) Culture PENDING  Incomplete   Fungal Source BRONCHIAL ALVEOLAR LAVAGE  Final    Comment: Performed at [facility], [place] [address], [place], [place] [address]  Pneumocystis smear by DFA     Status: None   Collection Time: 11/17/19  5:57 PM   Specimen: Bronchoalveolar Lavage; Respiratory  Result Value Ref Range Status   Specimen Source-PJSRC BRONCHIAL ALVEOLAR LAVAGE  Final   Pneumocystis jiroveci Ag NEGATIVE  Final    Comment: Performed at [facility] Performed at [facility], [address] [place], [place] [address]   Fungus Culture Result     Status: None   Collection Time: 11/17/19  5:57 PM  Result Value Ref Range Status   Result 1 Comment  Final    Comment: (NOTE) KOH/Calcofluor preparation:  no fungus observed. Performed At: [facility] [place], [place] [id number] Rush Farmer MD [id number]   MRSA PCR Screening     Status: None   Collection Time: 11/18/19  1:12 PM   Specimen: Nasal Mucosa; Nasopharyngeal  Result Value Ref Range Status   MRSA by PCR NEGATIVE NEGATIVE Final    Comment:        The GeneXpert MRSA Assay (FDA approved for NASAL specimens only), is one component of a comprehensive MRSA colonization surveillance program. It is not intended to diagnose MRSA infection nor to guide or monitor treatment for MRSA infections. Performed at [facility], [place] [address] [place], [place] [address]          Radiology Studies: No results  found.      Scheduled Meds: . amLODipine  2.5 mg Oral Daily  . chlorhexidine gluconate (MEDLINE KIT)  15 mL Mouth Rinse BID  . Chlorhexidine Gluconate Cloth  6 each Topical Daily  . DULoxetine  30 mg Oral QPM  . famotidine  20 mg Oral QHS  . feeding supplement (ENSURE ENLIVE)  237 mL Oral BID BM  . furosemide  40 mg Oral Daily  . lamoTRIgine  75 mg Oral Daily  . linaclotide  145 mcg Oral QAC breakfast  . mouth rinse  15 mL Mouth Rinse 10 times per day  . Melatonin  18 mg Oral QHS  . mometasone-formoterol  2 puff Inhalation BID  . multivitamin with minerals  1 tablet Oral Daily  . predniSONE  30 mg Oral Q breakfast   Followed by  . [START ON 12/02/2019] predniSONE  20 mg Oral Q breakfast   Followed by  . [START ON 12/09/2019] predniSONE  10 mg Oral Q breakfast  . rivaroxaban  2.5 mg Oral BID  . rOPINIRole  3 mg Oral QHS  . senna-docusate  2 tablet Oral Once   Continuous Infusions: . sodium chloride Stopped (11/19/19 1859)     LOS: 14 days    Time spent: 25 minutes    Edwin Dada, MD [facility] 11/25/2019, 6:22 PM     Please page though [employer] or Epic secure chat:  For [employer], Adult nurse

## 2019-11-26 LAB — CBC
HCT: 34.5 % — ABNORMAL LOW (ref 36.0–46.0)
Hemoglobin: 11.2 g/dL — ABNORMAL LOW (ref 12.0–15.0)
MCH: 29.6 pg (ref 26.0–34.0)
MCHC: 32.5 g/dL (ref 30.0–36.0)
MCV: 91 fL (ref 80.0–100.0)
Platelets: 433 10*3/uL — ABNORMAL HIGH (ref 150–400)
RBC: 3.79 MIL/uL — ABNORMAL LOW (ref 3.87–5.11)
RDW: 15.1 % (ref 11.5–15.5)
WBC: 12.5 10*3/uL — ABNORMAL HIGH (ref 4.0–10.5)
nRBC: 0 % (ref 0.0–0.2)

## 2019-11-26 LAB — BASIC METABOLIC PANEL
Anion gap: 14 (ref 5–15)
BUN: 42 mg/dL — ABNORMAL HIGH (ref 8–23)
CO2: 29 mmol/L (ref 22–32)
Calcium: 8.4 mg/dL — ABNORMAL LOW (ref 8.9–10.3)
Chloride: 91 mmol/L — ABNORMAL LOW (ref 98–111)
Creatinine, Ser: 1.37 mg/dL — ABNORMAL HIGH (ref 0.44–1.00)
GFR calc Af Amer: 43 mL/min — ABNORMAL LOW (ref >60)
GFR calc non Af Amer: 37 mL/min — ABNORMAL LOW (ref >60)
Glucose, Bld: 120 mg/dL — ABNORMAL HIGH (ref 70–99)
Potassium: 4.3 mmol/L (ref 3.5–5.1)
Sodium: 134 mmol/L — ABNORMAL LOW (ref 135–145)

## 2019-11-26 MED ORDER — BOOST / RESOURCE BREEZE PO LIQD CUSTOM: 1.0000 | Freq: Two times a day (BID) | ORAL | Status: DC

## 2019-11-26 NOTE — Progress Notes (Signed)
Nutrition Follow-up  DOCUMENTATION CODES:   Not applicable  INTERVENTION:   D/C Ensure Enlive, patient is refusing.  Try Boost Breeze po BID, each supplement provides 250 kcal and 9 grams of protein.  Continue Magic cup TID with meals, each supplement provides 290 kcal and 9 grams of protein.  Continue to offer MVI with minerals daily.  NUTRITION DIAGNOSIS:   Inadequate oral intake related to decreased appetite as evidenced by per patient/family report.  Ongoing   GOAL:   Patient will meet greater than or equal to 90% of their needs  Progressing   MONITOR:   PO intake, Supplement acceptance  ASSESSMENT:   Pt with PMH of AAA, CVA, CAD, HTN, gastritis, diffuse vascular disease, bipolar and depression who was admitted 3/2 with urosepsis.  Patient remains on a heart healthy diet. She is eating ~50% of meals on average. She refuses the Ensure Enlive supplements. She also refused her MVI this morning.   Attempted to call patient, but she did not answer her phone.   Plans for d/c to SNF soon.   Labs and medications reviewed.  No new weight available since admission.   Diet Order:   Diet Order            Diet Heart Room service appropriate? Yes with Assist; Fluid consistency: Thin  Diet effective 0500              EDUCATION NEEDS:   No education needs have been identified at this time  Skin:  Skin Assessment: (incision to thigh)  Last BM:  3/16  Height:   Ht Readings from Last 1 Encounters:  11/11/19 5\' 8"  (1.727 m)    Weight:   Wt Readings from Last 1 Encounters:  11/11/19 71.7 kg    Ideal Body Weight:  63.6 kg  BMI:  Body mass index is 24.03 kg/m.  Estimated Nutritional Needs:   Kcal:  1700-1900  Protein:  90-105 grams  Fluid:  > 1.7 L    Molli Barrows, RD, LDN, CNSC Please refer to Amion for contact information.

## 2019-11-26 NOTE — Clinical Social Work Note (Signed)
Bed offered and accepted at [facility]. Family will be responsible for $40/day for a private room. CSW informed [name](son) and he is agreeable to this plan. COVID negative on 11/25/19. Plan is to dc tomorrow 11/27/19 to SNF.Marland Kitchen

## 2019-11-26 NOTE — Progress Notes (Signed)
Occupational Therapy Treatment Patient Details Name: [name] MRN: [id number] DOB: Mar 21, 1946 Today's Date: 11/26/2019    History of present illness [name] is a 74yo female with a PMH significant for prior CVA, PAD, meningioma, hypothyroidism, hypertension, GERD, depression, bipolar disease, and anemia who presented to the ED on 3/1 with complaints of dysuria, urinary frequency, suprapubic abdominal pain, left-sided flank pain, and chills that began 1 week prior to admission. Pt with diagnosis of complicated UTI, with increasing AMS. Ct chest revealed ground glass opacities and mild cardiomegaly concerning for flash pulmonary edema, vs PNA, vs diffuse alveolar hemorrhage. ETT 3/8-3/9.   OT comments  Patient supine in bed on arrival and complaining of constipation.  Throughout session patient still demonstrating some cognitive deficits, unsure if this is baseline.  She was quite repetitive and required help with sequencing during toileting/toilet transfer.  Able to transfer with min guard and one hand held assist.  Completed dressing and grooming while seated.  Patient refused to sit in chair or attempt walking around room today.  Will continue to follow with OT acutely to address the deficits listed below.    Follow Up Recommendations  CIR;Supervision/Assistance - 24 hour    Equipment Recommendations  Other (comment)(tbd)    Recommendations for Other Services      Precautions / Restrictions Precautions Precautions: Fall Restrictions Weight Bearing Restrictions: No       Mobility Bed Mobility Overal bed mobility: Modified Independent                Transfers Overall transfer level: Needs assistance Equipment used: 1 person hand held assist Transfers: Sit to/from Stand;Stand Pivot Transfers Sit to Stand: Min guard Stand pivot transfers: Min guard            Balance Overall balance assessment: Needs assistance Sitting-balance support: Feet  supported Sitting balance-Leahy Scale: Fair     Standing balance support: No upper extremity supported;During functional activity Standing balance-Leahy Scale: Fair                             ADL either performed or assessed with clinical judgement   ADL Overall ADL's : Needs assistance/impaired     Grooming: Set up;Sitting       Lower Body Bathing: Set up;Supervison/ safety;Sit to/from stand   Upper Body Dressing : Set up;Sitting       Toilet Transfer: Min guard;Stand-pivot;BSC           Functional mobility during ADLs: Min guard;Rolling walker General ADL Comments: Pt declined walking and sitting up in chair     Vision       Perception     Praxis      Cognition Arousal/Alertness: Awake/alert Behavior During Therapy: WFL for tasks assessed/performed Overall Cognitive Status: No family/caregiver present to determine baseline cognitive functioning Area of Impairment: Attention;Safety/judgement;Awareness;Problem solving                   Current Attention Level: Selective Memory: Decreased recall of precautions;Decreased short-term memory Following Commands: Follows one step commands with increased time Safety/Judgement: Decreased awareness of safety;Decreased awareness of deficits Awareness: Emergent Problem Solving: Slow processing;Requires verbal cues;Difficulty sequencing General Comments: Clearer than previous visits but still poor attention and very repetitive        Exercises Other Exercises Other Exercises: pursed lip breathing   Shoulder Instructions       General Comments On 2L O2 throughout session, SpO2 normal range  Pertinent Vitals/ Pain       Pain Assessment: Faces Faces Pain Scale: Hurts little more Pain Location: Constipation/Burning sensation with urination - RN aware Pain Descriptors / Indicators: Burning;Cramping Pain Intervention(s): Limited activity within patient's tolerance;Monitored during  session;Relaxation;Other (comment)(RN aware)  Home Living                                          Prior Functioning/Environment              Frequency  Min 2X/week        Progress Toward Goals  OT Goals(current goals can now be found in the care plan section)  Progress towards OT goals: Progressing toward goals  Acute Rehab OT Goals Patient Stated Goal: go home and take a shower OT Goal Formulation: With patient Time For Goal Achievement: 12/03/19 Potential to Achieve Goals: Good  Plan Discharge plan remains appropriate    Co-evaluation                 AM-PAC OT "6 Clicks" Daily Activity     Outcome Measure   Help from another person eating meals?: None Help from another person taking care of personal grooming?: A Little Help from another person toileting, which includes using toliet, bedpan, or urinal?: A Little Help from another person bathing (including washing, rinsing, drying)?: A Little Help from another person to put on and taking off regular upper body clothing?: A Little Help from another person to put on and taking off regular lower body clothing?: A Little 6 Click Score: 19    End of Session Equipment Utilized During Treatment: Oxygen  OT Visit Diagnosis: Unsteadiness on feet (R26.81);Muscle weakness (generalized) (M62.81);Other symptoms and signs involving cognitive function   Activity Tolerance Patient limited by fatigue;Patient limited by pain   Patient Left in bed;with call bell/phone within reach   Nurse Communication Mobility status        Time: [id number] OT Time Calculation (min): 25 min  Charges: OT General Charges $OT Visit: 1 Visit OT Treatments $Self Care/Home Management : 23-37 mins  August Luz, OTR/L    Phylliss Bob 11/26/2019, 1:30 PM

## 2019-11-26 NOTE — Progress Notes (Signed)
PROGRESS NOTE  [name] KWI:[id number] DOB: [birth year] DOA: 11/10/2019 PCP: [name], MD   LOS: 15 days   Brief Narrative / Interim history: [name] is a 74 y.o. 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  On admission she reported a week of dysuria, urinary frequency suprapubic pain, left-sided flank pain and chills.  Subjective / 24h Interval events: She is doing well this morning, somewhat amazed at how weak she got in the past couple of weeks  Assessment & Plan: Principal Problem Septic shock due to pyelonephritis/E. coli bacteremia -Blood cultures showing E. coli, pansensitive.  Patient completed 9 days of intravenous cefepime while hospitalized.  Repeat cultures were done on 3/7 and were without growth -Currently she is being monitored off antibiotics, she is remained afebrile, hemodynamically stable  Active Problems Acute kidney injury secondary to ATN in the setting of septic shock -Previous baseline creatinine 0.9, creatinine peaked to 4.1 and slowly improving.  Currently creatinine has remained stable between 1.2-1.4, which is likely her new baseline.  She has no evidence of fluid overload.  Acute hypoxic respiratory failure/acute on chronic diastolic CHF/idiopathic pulmonary fibrosis / COPD -She follows up with Drs. Wert and Olalere as an outpatient, not on oxygen at baseline.  She became hypoxic in the hospital in the setting of E. coli sepsis, requiring as much as 15 L, probably from nonspecific diffuse lung injury on top of her underlying disease.  She had a bronchoscopy/BAL that was unremarkable.  She will need a prolonged 5-week prednisone taper per pulmonology, probably will need home oxygen, for now continue furosemide daily.  She will need follow-up with pulmonology in a couple of weeks.  Hypokalemia/hypomagnesemia -Replete as indicated, monitor  periodically  Anemia of chronic disease -Overall stable, no evidence of bleeding  Hypertension/peripheral vascular disease -Control blood pressure, continue Norvasc  Mood disorder -Continue clonazepam, duloxetine, lamotrigine  History of VTE -Continue Xarelto  Restless leg -Continue ropinirole  Moderate protein calorie malnutrition -As evidenced by severe critical illness, decreased muscle mass intact, decreased oral intake  IBS -Continue Linzess  Mediastinal lymphadenopathy I-ncidental finding on CT chest, follow-up CT chest with contrast in 3 months, on or around June 7  Disposition -To SNF, per SW she will have a bed available tomorrow   Scheduled Meds: . amLODipine  2.5 mg Oral Daily  . chlorhexidine gluconate (MEDLINE KIT)  15 mL Mouth Rinse BID  . Chlorhexidine Gluconate Cloth  6 each Topical Daily  . DULoxetine  30 mg Oral QPM  . famotidine  20 mg Oral QHS  . feeding supplement  1 Container Oral BID BM  . furosemide  40 mg Oral Daily  . lamoTRIgine  75 mg Oral Daily  . linaclotide  145 mcg Oral QAC breakfast  . mouth rinse  15 mL Mouth Rinse 10 times per day  . Melatonin  18 mg Oral QHS  . mometasone-formoterol  2 puff Inhalation BID  . multivitamin with minerals  1 tablet Oral Daily  . predniSONE  30 mg Oral Q breakfast   Followed by  . [START ON 12/02/2019] predniSONE  20 mg Oral Q breakfast   Followed by  . [START ON 12/09/2019] predniSONE  10 mg Oral Q breakfast  . rivaroxaban  2.5 mg Oral BID  . rOPINIRole  3 mg Oral QHS  . senna-docusate  2 tablet Oral Once   Continuous Infusions: . sodium chloride Stopped (  11/19/19 1859)   PRN Meds:.sodium chloride, acetaminophen, clonazePAM, guaiFENesin-codeine, HYDROcodone-acetaminophen, ipratropium-albuterol, ondansetron (ZOFRAN) IV, ondansetron, polyethylene glycol, senna-docusate, sodium chloride flush  DVT prophylaxis: On Xarelto Code Status: Full code Family Communication: will call son in the  afternoon Patient admitted from: home Anticipated d/c place: SNF Barriers to d/c: bed availability   Consultants:  Pulmonary   Procedures:  2D echo:  IMPRESSIONS    1. Left ventricular ejection fraction, by estimation, is 70 to 75%. The  left ventricle has hyperdynamic function. The left ventricle has no  regional wall motion abnormalities. There is mild concentric left  ventricular hypertrophy. Left ventricular  diastolic parameters are consistent with Grade I diastolic dysfunction  (impaired relaxation). Elevated left atrial pressure.  2. Right ventricular systolic function is normal. The right ventricular  size is normal. There is moderately elevated pulmonary artery systolic  pressure.  3. The mitral valve is normal in structure. No evidence of mitral valve  regurgitation. No evidence of mitral stenosis.  4. Aortic valve gradients are increased due to increased cardiac output  (consdier anemia, sepsis, thyrotoxicosis, etc.). The aortic valve is  normal in structure. Aortic valve regurgitation is trivial. Mild aortic  valve sclerosis is present, with no  evidence of aortic valve stenosis.  5. The inferior vena cava is dilated in size with <50% respiratory  variability, suggesting right atrial pressure of 15 mmHg.   Microbiology  E. coli bacteremia on blood cultures 3/1 Negative surveillance blood cultures 3/7  Antimicrobials: Completed cefepime   Objective: Vitals:   11/25/19 1703 11/26/19 0000 11/26/19 0820 11/26/19 0900  BP: 126/70 134/73  112/62  Pulse: 79 87  73  Resp: 18   20  Temp:  98.5 F (36.9 C)  97.7 F (36.5 C)  TempSrc:  Oral  Oral  SpO2: 97% 97% 97% 92%  Weight:      Height:        Intake/Output Summary (Last 24 hours) at 11/26/2019 1437 Last data filed at 11/26/2019 0545 Gross per 24 hour  Intake 280 ml  Output 1150 ml  Net -870 ml   Filed Weights   11/11/19 0010  Weight: 71.7 kg    Examination:  Constitutional: NAD Eyes: no  scleral icterus ENMT: Mucous membranes are moist.  Neck: normal, supple Respiratory: clear to auscultation bilaterally, no wheezing, no crackles. Normal respiratory effort. No accessory muscle use.  Cardiovascular: Regular rate and rhythm, no murmurs / rubs / gallops. No LE edema. Good peripheral pulses Abdomen: non distended, no tenderness. Bowel sounds positive.  Musculoskeletal: no clubbing / cyanosis.  Skin: no rashes Neurologic: CN 2-12 grossly intact. Strength 5/5 in all 4.    Data Reviewed: I have independently reviewed following labs and imaging studies   CBC: Recent Labs  Lab 11/20/19 0346 11/21/19 0353 11/22/19 0244 11/23/19 0536 11/23/19 1957 11/25/19 0850 11/26/19 0650  WBC 16.2*   < > 16.8* 17.4* 12.1* 13.6* 12.5*  NEUTROABS 14.2*  --   --   --  10.2*  --   --   HGB 9.1*   < > 10.0* 10.7* 10.6* 11.1* 11.2*  HCT 26.3*   < > 29.3* 32.1* 31.9* 34.9* 34.5*  MCV 85.4   < > 85.4 87.5 86.9 91.1 91.0  PLT 301   < > 311 396 411* 416* 433*   < > = values in this interval not displayed.   Basic Metabolic Panel: Recent Labs  Lab 11/20/19 0346 11/20/19 1203 11/21/19 0353 11/21/19 1659 11/22/19 0244 11/22/19 0244 11/22/19  2214 11/23/19 0536 11/24/19 0540 11/25/19 0850 11/26/19 0650  NA 136   < > 138   < > 134*  --   --  135 135 134* 134*  K 3.2*   < > 3.3*   < > 2.8*   < > 4.0 3.8 3.8 4.2 4.3  CL 101   < > 99   < > 94*  --   --  95* 91* 91* 91*  CO2 21*   < > 22   < > 28  --   --  _0 GLUCOSE 176*   < > 131*   < > 195*  --   --  158* 114* 155* 120*  BUN 63*   < > 60*   < > 52*  --   --  52* 47* 44* 42*  CREATININE 2.01*   < > 1.55*   < > 1.34*  --   --  1.55* 1.43* 1.26* 1.37*  CALCIUM 7.9*   < > 7.8*   < > 7.6*  --   --  8.2* 8.1* 8.5* 8.4*  MG 1.8  --  1.6*  --  1.8  --   --  1.6*  --   --   --   PHOS 4.0  --   --   --   --   --   --   --   --   --   --    < > = values in this interval not displayed.   Liver Function Tests: Recent Labs  Lab  11/21/19 0353 11/22/19 0244 11/23/19 0536  AST _1 ALT _2 ALKPHOS 129* 126 126  BILITOT 1.2 1.1 0.8  PROT 6.1* 6.3* 6.5  ALBUMIN 2.3* 2.3* 2.4*   Coagulation Profile: No results for input(s): INR, PROTIME in the last 168 hours. HbA1C: No results for input(s): HGBA1C in the last 72 hours. CBG: Recent Labs  Lab 11/22/19 1729  GLUCAP 179*    Recent Results (from the past 240 hour(s))  Culture, Urine     Status: Abnormal   Collection Time: 11/16/19  1:59 PM   Specimen: Urine, Clean Catch  Result Value Ref Range Status   Specimen Description URINE, CLEAN CATCH  Final   Special Requests   Final    NONE Performed at [facility], [place] [address] [place], [place] [address]    Culture MULTIPLE SPECIES PRESENT, SUGGEST RECOLLECTION (A)  Final   Report Status 11/18/2019 FINAL  Final  Acid Fast Smear (AFB)     Status: None   Collection Time: 11/17/19  5:57 PM   Specimen: Bronchial Alveolar Lavage  Result Value Ref Range Status   AFB Specimen Processing Concentration  Final   Acid Fast Smear Negative  Final    Comment: (NOTE) Performed At: [facility] [place], [place] [id number] Rush Farmer MD [id number]    Source (AFB) BRONCHIAL ALVEOLAR LAVAGE  Final    Comment: Performed at [facility], [place] [address], [place], [place] [address]  Culture, respiratory     Status: None   Collection Time: 11/17/19  5:57 PM   Specimen: Bronchoalveolar Lavage; Respiratory  Result Value Ref Range Status   Specimen Description BRONCHIAL ALVEOLAR LAVAGE  Final   Special Requests NONE  Final   Gram Stain   Final    FEW WBC PRESENT,BOTH PMN AND MONONUCLEAR NO ORGANISMS SEEN    Culture  Final    Consistent with normal respiratory flora. Performed at [facility], [place] [address], [place], [place] [address]    Report Status 11/19/2019 FINAL  Final  Fungus Culture With Stain     Status: None (Preliminary result)   Collection Time:  11/17/19  5:57 PM   Specimen: Bronchial Alveolar Lavage  Result Value Ref Range Status   Fungus Stain Final report  Final    Comment: (NOTE) Performed At: [facility] [place], [place] [id number] Rush Farmer MD [id number]    Fungus (Mycology) Culture PENDING  Incomplete   Fungal Source BRONCHIAL ALVEOLAR LAVAGE  Final    Comment: Performed at [facility], [place] [address] [place], [place] [address]  Pneumocystis smear by DFA     Status: None   Collection Time: 11/17/19  5:57 PM   Specimen: Bronchoalveolar Lavage; Respiratory  Result Value Ref Range Status   Specimen Source-PJSRC BRONCHIAL ALVEOLAR LAVAGE  Final   Pneumocystis jiroveci Ag NEGATIVE  Final    Comment: Performed at [facility] Performed at [facility], [address], [place], [place] [address]   Fungus Culture Result     Status: None   Collection Time: 11/17/19  5:57 PM  Result Value Ref Range Status   Result 1 Comment  Final    Comment: (NOTE) KOH/Calcofluor preparation:  no fungus observed. Performed At: [facility] [place], [place] [id number] Rush Farmer MD [id number]   MRSA PCR Screening     Status: None   Collection Time: 11/18/19  1:12 PM   Specimen: Nasal Mucosa; Nasopharyngeal  Result Value Ref Range Status   MRSA by PCR NEGATIVE NEGATIVE Final    Comment:        The GeneXpert MRSA Assay (FDA approved for NASAL specimens only), is one component of a comprehensive MRSA colonization surveillance program. It is not intended to diagnose MRSA infection nor to guide or monitor treatment for MRSA infections. Performed at [facility], [place] [address] [place], [place] [address]   SARS CORONAVIRUS 2 (TAT 6-24 HRS) Nasopharyngeal Nasopharyngeal Swab     Status: None   Collection Time: 11/25/19  2:02 PM   Specimen: Nasopharyngeal Swab  Result Value Ref Range Status   SARS Coronavirus 2 NEGATIVE NEGATIVE Final     Comment: (NOTE) SARS-CoV-2 target nucleic acids are NOT DETECTED. The SARS-CoV-2 RNA is generally detectable in upper and lower respiratory specimens during the acute phase of infection. Negative results do not preclude SARS-CoV-2 infection, do not rule out co-infections with other pathogens, and should not be used as the sole basis for treatment or other patient management decisions. Negative results must be combined with clinical observations, patient history, and epidemiological information. The expected result is Negative. Fact Sheet for Patients: SugarRoll.be Fact Sheet for Healthcare Providers: https://www.woods-mathews.com/ This test is not yet approved or cleared by the [place] FDA and  has been authorized for detection and/or diagnosis of SARS-CoV-2 by FDA under an Emergency Use Authorization (EUA). This EUA will remain  in effect (meaning this test can be used) for the duration of the COVID-19 declaration under Section 56 4(b)(1) of the Act, 21 U.S.C. section 360bbb-3(b)(1), unless the authorization is terminated or revoked sooner. Performed at [facility], [place] [address], [place], [place] [address]      Radiology Studies: No results found.  Marzetta Board, MD, PhD [facility]  Between 7 am - 7 pm I  am available, please contact me via Amion or Securechat  Between 7 pm - 7 am I am not available, please contact night coverage MD/APP via Amion

## 2019-11-27 ENCOUNTER — Ambulatory Visit: Payer: Medicare Other | Admitting: Vascular Surgery

## 2019-11-27 ENCOUNTER — Telehealth: Payer: Self-pay | Admitting: Psychiatry

## 2019-11-27 DIAGNOSIS — G2581 Restless legs syndrome: Secondary | ICD-10-CM | POA: Diagnosis not present

## 2019-11-27 DIAGNOSIS — E440 Moderate protein-calorie malnutrition: Secondary | ICD-10-CM | POA: Diagnosis not present

## 2019-11-27 DIAGNOSIS — I5033 Acute on chronic diastolic (congestive) heart failure: Secondary | ICD-10-CM | POA: Diagnosis not present

## 2019-11-27 DIAGNOSIS — F319 Bipolar disorder, unspecified: Secondary | ICD-10-CM | POA: Diagnosis not present

## 2019-11-27 DIAGNOSIS — D638 Anemia in other chronic diseases classified elsewhere: Secondary | ICD-10-CM | POA: Diagnosis not present

## 2019-11-27 DIAGNOSIS — J84112 Idiopathic pulmonary fibrosis: Secondary | ICD-10-CM | POA: Diagnosis not present

## 2019-11-27 DIAGNOSIS — G4721 Circadian rhythm sleep disorder, delayed sleep phase type: Secondary | ICD-10-CM | POA: Diagnosis not present

## 2019-11-27 DIAGNOSIS — I714 Abdominal aortic aneurysm, without rupture: Secondary | ICD-10-CM | POA: Diagnosis not present

## 2019-11-27 DIAGNOSIS — F419 Anxiety disorder, unspecified: Secondary | ICD-10-CM | POA: Diagnosis not present

## 2019-11-27 DIAGNOSIS — K589 Irritable bowel syndrome without diarrhea: Secondary | ICD-10-CM | POA: Diagnosis not present

## 2019-11-27 DIAGNOSIS — R6521 Severe sepsis with septic shock: Secondary | ICD-10-CM | POA: Diagnosis not present

## 2019-11-27 DIAGNOSIS — J9600 Acute respiratory failure, unspecified whether with hypoxia or hypercapnia: Secondary | ICD-10-CM | POA: Diagnosis not present

## 2019-11-27 DIAGNOSIS — J449 Chronic obstructive pulmonary disease, unspecified: Secondary | ICD-10-CM | POA: Diagnosis not present

## 2019-11-27 DIAGNOSIS — E039 Hypothyroidism, unspecified: Secondary | ICD-10-CM | POA: Diagnosis not present

## 2019-11-27 DIAGNOSIS — I2510 Atherosclerotic heart disease of native coronary artery without angina pectoris: Secondary | ICD-10-CM | POA: Diagnosis not present

## 2019-11-27 DIAGNOSIS — R531 Weakness: Secondary | ICD-10-CM | POA: Diagnosis not present

## 2019-11-27 DIAGNOSIS — N179 Acute kidney failure, unspecified: Secondary | ICD-10-CM | POA: Diagnosis not present

## 2019-11-27 DIAGNOSIS — M2550 Pain in unspecified joint: Secondary | ICD-10-CM | POA: Diagnosis not present

## 2019-11-27 DIAGNOSIS — A4151 Sepsis due to Escherichia coli [E. coli]: Secondary | ICD-10-CM | POA: Diagnosis not present

## 2019-11-27 DIAGNOSIS — G3184 Mild cognitive impairment, so stated: Secondary | ICD-10-CM | POA: Diagnosis not present

## 2019-11-27 DIAGNOSIS — I110 Hypertensive heart disease with heart failure: Secondary | ICD-10-CM | POA: Diagnosis not present

## 2019-11-27 DIAGNOSIS — R918 Other nonspecific abnormal finding of lung field: Secondary | ICD-10-CM | POA: Diagnosis not present

## 2019-11-27 DIAGNOSIS — N390 Urinary tract infection, site not specified: Secondary | ICD-10-CM | POA: Diagnosis not present

## 2019-11-27 DIAGNOSIS — F331 Major depressive disorder, recurrent, moderate: Secondary | ICD-10-CM | POA: Diagnosis not present

## 2019-11-27 DIAGNOSIS — J9601 Acute respiratory failure with hypoxia: Secondary | ICD-10-CM | POA: Diagnosis not present

## 2019-11-27 DIAGNOSIS — G4700 Insomnia, unspecified: Secondary | ICD-10-CM | POA: Diagnosis not present

## 2019-11-27 DIAGNOSIS — Z7952 Long term (current) use of systemic steroids: Secondary | ICD-10-CM | POA: Diagnosis not present

## 2019-11-27 DIAGNOSIS — I509 Heart failure, unspecified: Secondary | ICD-10-CM | POA: Diagnosis not present

## 2019-11-27 DIAGNOSIS — I739 Peripheral vascular disease, unspecified: Secondary | ICD-10-CM | POA: Diagnosis not present

## 2019-11-27 DIAGNOSIS — Z7401 Bed confinement status: Secondary | ICD-10-CM | POA: Diagnosis not present

## 2019-11-27 DIAGNOSIS — R0902 Hypoxemia: Secondary | ICD-10-CM | POA: Diagnosis not present

## 2019-11-27 DIAGNOSIS — F329 Major depressive disorder, single episode, unspecified: Secondary | ICD-10-CM | POA: Diagnosis not present

## 2019-11-27 DIAGNOSIS — F411 Generalized anxiety disorder: Secondary | ICD-10-CM | POA: Diagnosis not present

## 2019-11-27 DIAGNOSIS — Z9981 Dependence on supplemental oxygen: Secondary | ICD-10-CM | POA: Diagnosis not present

## 2019-11-27 DIAGNOSIS — R6520 Severe sepsis without septic shock: Secondary | ICD-10-CM | POA: Diagnosis not present

## 2019-11-27 DIAGNOSIS — R0602 Shortness of breath: Secondary | ICD-10-CM | POA: Diagnosis not present

## 2019-11-27 DIAGNOSIS — Z8673 Personal history of transient ischemic attack (TIA), and cerebral infarction without residual deficits: Secondary | ICD-10-CM | POA: Diagnosis not present

## 2019-11-27 DIAGNOSIS — I10 Essential (primary) hypertension: Secondary | ICD-10-CM | POA: Diagnosis not present

## 2019-11-27 MED ORDER — CLONAZEPAM 0.5 MG PO TABS: 0.5000 mg | ORAL_TABLET | Freq: Two times a day (BID) | ORAL | 0 refills | Status: DC | PRN

## 2019-11-27 MED ORDER — PREDNISONE 10 MG PO TABS: 10.0000 mg | ORAL_TABLET | Freq: Every day | ORAL | Status: DC

## 2019-11-27 MED ORDER — PREDNISONE 20 MG PO TABS: 20.0000 mg | ORAL_TABLET | Freq: Every day | ORAL | Status: DC

## 2019-11-27 MED ORDER — FUROSEMIDE 40 MG PO TABS: 40.0000 mg | ORAL_TABLET | Freq: Every day | ORAL | Status: DC

## 2019-11-27 MED ORDER — AMLODIPINE BESYLATE 2.5 MG PO TABS: 2.5000 mg | ORAL_TABLET | Freq: Every day | ORAL | Status: DC

## 2019-11-27 MED ORDER — PREDNISONE 10 MG PO TABS: 30.0000 mg | ORAL_TABLET | Freq: Every day | ORAL | Status: DC

## 2019-11-27 NOTE — Progress Notes (Signed)
This RN attempted to call report to the number for [facility] at [place] that was provided by the CSW twice, voicemail was reached both times. Message left with a call back number.

## 2019-11-27 NOTE — Discharge Summary (Signed)
Physician Discharge Summary  [name] GMW:[id number] DOB: 02-27-1946 DOA: 11/10/2019  PCP: [name], MD  Admit date: 11/10/2019 Discharge date: 11/27/2019  Admitted From: home Disposition:  SNF  Recommendations for Outpatient Follow-up:  1. Follow up with PCP in 1-2 weeks 2. Follow BMP twice weekly 3. Continue Prednisone taper as below, 30 mg daily until 3/22, then 20 mg for 7 days until 3/29 then 10 mg daily for 14 days then stop;  Home Health: none Equipment/Devices: none  Discharge Condition: stable CODE STATUS: Full code Diet recommendation: heart healthy  HPI: Per admitting MD, [name] is a 74 y.o. female with medical history significant of AAA, bipolar disorder, cerebrovascular disease, CAD, depression, GERD, gastritis, hypertension, hypothyroidism, meningioma, PAD, psoriasis, CVA, and conditions listed below being sent to the ED by her PCP for evaluation of severe UTI symptoms and chills.  Patient reports 1 week history of dysuria, urinary frequency/urgency, suprapubic pain, left-sided flank pain, and chills.  She has been noticing blood in her urine and feels it is very foul-smelling.  Yesterday she had a low-grade fever.  She has been feeling nauseous but has not vomited.  Endorses decreased p.o. intake.  No other complaints.  Hospital Course / Discharge diagnoses:  Principal Problem Septic shock due to pyelonephritis/E. coli bacteremia -Blood cultures showing E. coli, pansensitive.  Patient completed 9 days of intravenous cefepime while hospitalized.  Repeat cultures were done on 3/7 and were without growth. Currently she is being monitored off antibiotics, she is remained afebrile, hemodynamically stable  Active Problems Acute kidney injury secondary to ATN in the setting of septic shock -Previous baseline creatinine 0.9, creatinine peaked to 4.1 and slowly improving.  Currently creatinine has remained stable between 1.2-1.4, which is likely her new  baseline.  She has no evidence of fluid overload. On Lasix as recommended by pulmonary Acute hypoxic respiratory failure/acute on chronic diastolic CHF/idiopathic pulmonary fibrosis / COPD -She follows up with Drs. Wert and Olalere as an outpatient, not on oxygen at baseline.  She became hypoxic in the hospital in the setting of E. coli sepsis, requiring as much as 15 L, probably from nonspecific diffuse lung injury on top of her underlying disease.  She had a bronchoscopy/BAL that was unremarkable.  She will need a prolonged 5-week prednisone taper per pulmonology, probably will need home oxygen, for now continue furosemide daily.  She will need follow-up with pulmonology in a couple of weeks. Hypokalemia/hypomagnesemia -Replete as indicated, monitor periodically Anemia of chronic disease -Overall stable, no evidence of bleeding Hypertension/peripheral vascular disease -Control blood pressure, continue Norvasc Mood disorder -Continue clonazepam, duloxetine, lamotrigine History of VTE -Continue Xarelto Restless leg -Continueropinirole Moderate protein calorie malnutrition -As evidenced by severe critical illness, decreased muscle mass intact, decreased oral intake IBS -ContinueLinzess Mediastinal lymphadenopathy I-ncidental finding on CT chest, follow-up CT chest with contrast in 3 months, on or around June 7 Disposition -To SNF  Discharge Instructions   Allergies as of 11/27/2019   No Known Allergies     Medication List    STOP taking these medications   losartan 50 MG tablet Commonly known as: COZAAR     TAKE these medications   amLODipine 2.5 MG tablet Commonly known as: NORVASC Take 1 tablet (2.5 mg total) by mouth daily. Start taking on: November 28, 2019 What changed:   medication strength  how much to take   clonazePAM 0.5 MG tablet Commonly known as: KLONOPIN Take 1 tablet (0.5 mg total) by mouth 2 (  two) times daily as needed for anxiety. TAKE 1 TABLET BY MOUTH AT  BEDTIME FOR ANXIETY What changed: See the new instructions.   DULoxetine 30 MG capsule Commonly known as: CYMBALTA Take 1 capsule (30 mg total) by mouth every evening.   famotidine 20 MG tablet Commonly known as: Pepcid One at bedtime What changed:   how much to take  how to take this  when to take this   furosemide 40 MG tablet Commonly known as: LASIX Take 1 tablet (40 mg total) by mouth daily. Start taking on: November 28, 2019   lamoTRIgine 150 MG tablet Commonly known as: LAMICTAL TAKE 1 TABLET(150 MG) BY MOUTH TWICE DAILY What changed: See the new instructions.   linaclotide 145 MCG Caps capsule Commonly known as: LINZESS Take 1 capsule (145 mcg total) by mouth daily before breakfast.   Melatonin 10 MG Tabs Take 20 mg by mouth at bedtime.   polyethylene glycol powder 17 GM/SCOOP powder Commonly known as: GLYCOLAX/MIRALAX Take 17 g by mouth daily as needed for moderate constipation.   predniSONE 10 MG tablet Commonly known as: DELTASONE Take 3 tablets (30 mg total) by mouth daily with breakfast. Start taking on: November 28, 2019   predniSONE 20 MG tablet Commonly known as: DELTASONE Take 1 tablet (20 mg total) by mouth daily with breakfast. Start taking on: December 02, 2019   predniSONE 10 MG tablet Commonly known as: DELTASONE Take 1 tablet (10 mg total) by mouth daily with breakfast. Start taking on: December 09, 2019   rivaroxaban 20 MG Tabs tablet Commonly known as: XARELTO Take 1 tablet (20 mg total) by mouth daily with supper.   rOPINIRole 2 MG tablet Commonly known as: REQUIP TAKE 1 AND 1/2 TABLETS(3 MG) BY MOUTH AT BEDTIME What changed: See the new instructions.      Follow-up Information    Olalere, Adewale A, MD Follow up in 2 week(s).   Specialty: Pulmonary Disease Why: We will call you with appt date/time, call office if you do not hear from [place] by end of month. Contact information: [address] [place]  [address] [phone]           Consultations:  PCCM  Procedures/Studies:  CT ABDOMEN PELVIS WO CONTRAST  Result Date: 11/11/2019 CLINICAL DATA:  74 year old female with history of complicated pyelonephritis. EXAM: CT ABDOMEN AND PELVIS WITHOUT CONTRAST TECHNIQUE: Multidetector CT imaging of the abdomen and pelvis was performed following the standard protocol without IV contrast. COMPARISON:  CT the abdomen and pelvis 11/07/2018. FINDINGS: Lower chest: Chronic fibrosis in the lung bases again noted. Atherosclerotic calcifications in the descending thoracic aorta as well as the left anterior descending and right coronary arteries. Calcifications of the aortic valve. Hepatobiliary: No definite suspicious cystic or solid hepatic lesions are confidently identified on today's noncontrast CT examination. Unenhanced appearance of the gallbladder is unremarkable. Pancreas: No definite pancreatic mass or peripancreatic fluid collections or inflammatory changes are noted on today's noncontrast CT examination. Spleen: Unremarkable. Adrenals/Urinary Tract: 1.7 low-attenuation lesion in the anterior aspect of the lower pole the left kidney, previously characterized as a simple cyst. Unenhanced appearance of the right kidney and bilateral adrenal glands is unremarkable. No unexpected perinephric fluid collections. No hydroureteronephrosis. Urinary bladder is nearly completely decompressed, and otherwise unremarkable in appearance. Stomach/Bowel: Unenhanced appearance of the stomach is normal. No pathologic dilatation of small bowel or colon. Vascular/Lymphatic: Ectasia of the infrarenal abdominal aorta which measures up to 3.0 x 2.5 cm (mean diameter  of 2.8 cm). No lymphadenopathy noted in the abdomen or pelvis. Reproductive: Uterus and ovaries are atrophic. Other: No significant volume of ascites.  No pneumoperitoneum. Musculoskeletal: There are no aggressive appearing lytic or blastic lesions noted in the  visualized portions of the skeleton. IMPRESSION: 1. No acute findings are noted in the abdomen or pelvis. Specifically, no unexpected perinephric fluid collections or other signs of complicated pyelonephritis. 2. Pulmonary fibrosis in the visualize lung bases, similar to prior studies. 3. Aortic atherosclerosis, as well as multi-vessel coronary artery disease and ectasia of the infrarenal abdominal aorta (mean diameter of 2.8 cm). 4. Additional incidental findings, as above. Electronically Signed   By: Vinnie Langton M.D.   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 Findings are not significantly changed since the 2020 radiographic comparison. No consolidative opacity. No pneumothorax or effusion. No acute osseous or soft tissue abnormality. Degenerative changes are present in the imaged spine and shoulders. The aorta is calcified. The remaining cardiomediastinal contours are unremarkable. 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 No acute cardiopulmonary abnormality. Aortic Atherosclerosis (ICD10-I70.0). Electronically Signed   By: Lovena Le M.D.   On: 11/10/2019 18:26   CT CHEST WO CONTRAST  Result Date: 11/17/2019 CLINICAL DATA:  Inpatient with persistent cough and dyspnea, admitted with UTI, left pyelonephritis and sepsis. EXAM: CT CHEST WITHOUT CONTRAST TECHNIQUE: Multidetector CT imaging of the chest was performed following the standard protocol without IV contrast. COMPARISON:  09/26/2019 chest CT angiogram. 11/14/2019 chest radiograph. FINDINGS:  Cardiovascular: Mild cardiomegaly. No significant pericardial effusion/thickening. Left anterior descending and right coronary atherosclerosis. Atherosclerotic nonaneurysmal thoracic aorta. Top-normal caliber main pulmonary artery (3.2 cm diameter). Mediastinum/Nodes: No discrete thyroid nodules. Small amount of retained oral contrast in the lower thoracic esophagus, suggesting dysmotility or reflux. No axillary adenopathy. Mildly enlarged 1.4 cm right paratracheal node (series 4/image 58), increased from 1.0 cm on 09/26/2019 chest CT. Newly enlarged 1.7 cm AP window node (series 4/image 50). Hilar nodes poorly delineated on this noncontrast scan. Lungs/Pleura: No pneumothorax. Small dependent left pleural effusion is new from prior chest CT. No right pleural effusion. Moderate centrilobular emphysema. There is new severe patchy ground-glass opacity, consolidation and septal thickening throughout both lungs, most prominent in the upper lobes. No discrete lung masses or appreciable pulmonary nodules. Upper abdomen: No acute abnormality. Musculoskeletal: No aggressive appearing focal osseous lesions. Moderate thoracic spondylosis. IMPRESSION: 1. New severe patchy ground-glass opacity, consolidation and septal thickening throughout both lungs, most prominent in the upper lobes. Given the mild cardiomegaly and small dependent left pleural effusion, findings could represent flash pulmonary edema, although the differential includes multilobar pneumonia or diffuse alveolar hemorrhage. 2. Nonspecific mild mediastinal lymphadenopathy, increased since 09/26/2019 chest CT, possibly reactive. Suggest attention on follow-up chest CT with IV contrast in 3 months. 3. Two-vessel coronary atherosclerosis. 4. Aortic Atherosclerosis (ICD10-I70.0) and Emphysema (ICD10-J43.9). Electronically Signed   By: Ilona Sorrel M.D.   On: 11/17/2019 09:24   US RENAL  Result Date: 11/18/2019 CLINICAL DATA:  Renal failure. EXAM: RENAL / URINARY  TRACT ULTRASOUND COMPLETE COMPARISON:  CT abdomen and pelvis of 11/11/2019 FINDINGS: Right Kidney: Renal measurements: 12 x 4.8  x 6.1 cm = volume: 183 mL . Echogenicity within normal limits. No mass or hydronephrosis visualized. Left Kidney: Renal measurements: 14 x 5.4 x 7.1 cm = volume: 280 mL. Echogenicity within normal limits. No mass or hydronephrosis visualized. Trace perinephric fluid/edema seen on the left. Bladder: Foley catheter within a decompressed urinary bladder. Other: Signs of small to moderate left pleural effusion incidentally noted. Also with small gallstone in the gallbladder. IMPRESSION: 1. No signs of hydronephrosis. Trace perinephric fluid on the left without focal, organized collection. 2. Foley catheter in decompressed urinary bladder. 3. Small to moderate left effusion. 4. Cholelithiasis. Electronically Signed   By: Zetta Bills M.D.   On: 11/18/2019 09:49   DG Chest Port 1 View  Result Date: 11/21/2019 CLINICAL DATA:  Acute respiratory failure with hypoxemia. EXAM: PORTABLE CHEST 1 VIEW COMPARISON:  11/20/2019 and CT chest 11/17/2019. FINDINGS: Trachea is midline. Heart size stable. Thoracic aorta is calcified. Diffuse mixed interstitial and airspace opacification, possibly minimally improved from 11/20/2019. There may be tiny bilateral pleural effusions. IMPRESSION: Diffuse mixed interstitial and airspace opacification, possibly minimally improved from 11/20/2019. Probable trace bilateral pleural effusions. Findings are indicative congestive heart failure and/or pneumonia. Electronically Signed   By: Lorin Picket M.D.   On: 11/21/2019 09:23   DG Chest Port 1 View  Result Date: 11/20/2019 CLINICAL DATA:  Acute respiratory failure EXAM: PORTABLE CHEST 1 VIEW COMPARISON:  Yesterday FINDINGS: Interstitial and airspace opacity which is diffuse and stable to mildly increased. Borderline heart size is stable. No visible effusion or pneumothorax. IMPRESSION: Stable or mildly  increased generalized airspace disease. Electronically Signed   By: Monte Fantasia M.D.   On: 11/20/2019 07:43   DG CHEST PORT 1 VIEW  Result Date: 11/19/2019 CLINICAL DATA:  Sepsis. EXAM: PORTABLE CHEST 1 VIEW COMPARISON:  11/18/2019 FINDINGS: The endotracheal tube has been removed. Persistent diffuse interstitial and airspace process in the lungs. No pleural effusions. No pneumothorax. IMPRESSION: Removal of ET tube. Persistent diffuse interstitial and airspace process. Electronically Signed   By: Marijo Sanes M.D.   On: 11/19/2019 08:26   DG CHEST PORT 1 VIEW  Result Date: 11/18/2019 CLINICAL DATA:  ETT, sepsis EXAM: PORTABLE CHEST 1 VIEW COMPARISON:  11/17/2019 FINDINGS: Endotracheal tube terminates 7 cm above the carina. Diffuse interstitial/airspace opacities bilaterally, unchanged. Mild bibasilar atelectasis. No pleural effusion or pneumothorax. The heart is normal in size.  Thoracic aortic atherosclerosis. IMPRESSION: Endotracheal tube terminates 7 cm above the carina. Multifocal airspace opacities bilaterally, favoring multifocal pneumonia over interstitial edema. Electronically Signed   By: Julian Hy M.D.   On: 11/18/2019 09:20   DG CHEST PORT 1 VIEW  Result Date: 11/17/2019 CLINICAL DATA:  Mechanical ventilation EXAM: PORTABLE CHEST 1 VIEW COMPARISON:  November 14, 2019 FINDINGS: There are worsening airspace opacities bilaterally. The endotracheal tube terminates approximately 5.3 cm above the carina. The heart size is stable from prior study. Aortic calcifications are noted. There is no pneumothorax. There may be trace bilateral pleural effusions. There is no acute osseous abnormality. IMPRESSION: 1. Endotracheal tube as above. 2. Again noted are scattered bilateral airspace opacities as seen on recent CT. 3. No pneumothorax. Electronically Signed   By: Constance Holster M.D.   On: 11/17/2019 19:08   DG Chest Port 1 View  Result Date: 11/14/2019 CLINICAL DATA:  Cough. EXAM: PORTABLE  CHEST 1 VIEW COMPARISON:  PA and lateral chest 11/10/2019 in 03/12/2019. CT chest, abdomen and pelvis 09/26/2019. FINDINGS: Changes of emphysema and UIP are again seen. Lung  volumes are somewhat lower than on the comparison plain films with crowding of the bronchovascular structures. No consolidative process, pneumothorax or effusion. Heart size is normal. Atherosclerosis. IMPRESSION: No acute disease in a patient with emphysema and chronic interstitial lung disease. Atherosclerosis. Electronically Signed   By: Inge Rise M.D.   On: 11/14/2019 09:51   DG Abd Portable 1V  Result Date: 11/14/2019 CLINICAL DATA:  Mid abdominal pain. EXAM: PORTABLE ABDOMEN - 1 VIEW COMPARISON:  Plain films of the chest and abdomen 08/18/2019. CT abdomen and pelvis 11/11/2019. FINDINGS: Gas-filled loops of small and large bowel throughout the abdomen without notable distension appear similar to the prior exams. No abnormal abdominal calcification is identified. Convex right scoliosis and lumbar degenerative change noted. IMPRESSION: No acute abnormality. Electronically Signed   By: Inge Rise M.D.   On: 11/14/2019 09:47   ECHOCARDIOGRAM COMPLETE  Result Date: 11/18/2019    ECHOCARDIOGRAM REPORT   Patient Name:   [name] Date of Exam: 11/18/2019 Medical Rec #:  [id number]      Height:       68.0 in Accession #:    [id number]     Weight:       158.1 lb Date of Birth:  1946-07-23       BSA:          1.849 m Patient Age:    23 years       BP:           95/57 mmHg Patient Gender: F              HR:           79 bpm. Exam Location:  Inpatient Procedure: 2D Echo Indications:    Chest Pain R07.9  History:        Patient has prior history of Echocardiogram examinations, most                 recent 10/25/2017. CAD; Risk Factors:Hypertension.  Sonographer:    Mikki Santee RDCS (AE) Referring Phys: [id number] [place]  Sonographer Comments: Echo performed with patient supine and on artificial respirator. IMPRESSIONS   1. Left ventricular ejection fraction, by estimation, is 70 to 75%. The left ventricle has hyperdynamic function. The left ventricle has no regional wall motion abnormalities. There is mild concentric left ventricular hypertrophy. Left ventricular diastolic parameters are consistent with Grade I diastolic dysfunction (impaired relaxation). Elevated left atrial pressure.  2. Right ventricular systolic function is normal. The right ventricular size is normal. There is moderately elevated pulmonary artery systolic pressure.  3. The mitral valve is normal in structure. No evidence of mitral valve regurgitation. No evidence of mitral stenosis.  4. Aortic valve gradients are increased due to increased cardiac output (consdier anemia, sepsis, thyrotoxicosis, etc.). The aortic valve is normal in structure. Aortic valve regurgitation is trivial. Mild aortic valve sclerosis is present, with no evidence of aortic valve stenosis.  5. The inferior vena cava is dilated in size with <50% respiratory variability, suggesting right atrial pressure of 15 mmHg. Comparison(s): Prior images reviewed side by side. Changes from prior study are noted. There is evidence of high cardiac output and increased filling pressures. FINDINGS  Left Ventricle: Left ventricular ejection fraction, by estimation, is 70 to 75%. The left ventricle has hyperdynamic function. The left ventricle has no regional wall motion abnormalities. The left ventricular internal cavity size was normal in size. There is mild concentric left ventricular hypertrophy. Left ventricular diastolic parameters are consistent  with Grade I diastolic dysfunction (impaired relaxation). Elevated left atrial pressure. Right Ventricle: The right ventricular size is normal. No increase in right ventricular wall thickness. Right ventricular systolic function is normal. There is moderately elevated pulmonary artery systolic pressure. The tricuspid regurgitant velocity is 2.88 m/s, and with  an assumed right atrial pressure of 15 mmHg, the estimated right ventricular systolic pressure is 03.8 mmHg. Left Atrium: Left atrial size was normal in size. Right Atrium: Right atrial size was normal in size. Pericardium: There is no evidence of pericardial effusion. Mitral Valve: The mitral valve is normal in structure. Normal mobility of the mitral valve leaflets. No evidence of mitral valve regurgitation. No evidence of mitral valve stenosis. Tricuspid Valve: The tricuspid valve is normal in structure. Tricuspid valve regurgitation is trivial. No evidence of tricuspid stenosis. Aortic Valve: Aortic valve gradients are increased due to increased cardiac output (consdier anemia, sepsis, thyrotoxicosis, etc.). The aortic valve is normal in structure. Aortic valve regurgitation is trivial. Mild aortic valve sclerosis is present, with no evidence of aortic valve stenosis. Aortic valve mean gradient measures 13.5 mmHg. Aortic valve peak gradient measures 26.8 mmHg. Aortic valve area, by VTI measures 2.07 cm. Pulmonic Valve: The pulmonic valve was normal in structure. Pulmonic valve regurgitation is not visualized. No evidence of pulmonic stenosis. Aorta: The aortic root is normal in size and structure. Venous: IVC dilation is a nonspecific finding during positive pressure ventilation. The inferior vena cava is dilated in size with less than 50% respiratory variability, suggesting right atrial pressure of 15 mmHg. IAS/Shunts: No atrial level shunt detected by color flow Doppler.  LEFT VENTRICLE PLAX 2D LVIDd:         4.13 cm  Diastology LVIDs:         2.27 cm  LV e' lateral:   7.40 cm/s LV PW:         1.39 cm  LV E/e' lateral: 14.1 LV IVS:        1.19 cm  LV e' medial:    6.64 cm/s LVOT diam:     2.00 cm  LV E/e' medial:  15.7 LV SV:         107 LV SV Index:   58 LVOT Area:     3.14 cm  RIGHT VENTRICLE RV S prime:     14.10 cm/s TAPSE (M-mode): 2.5 cm LEFT ATRIUM             Index       RIGHT ATRIUM           Index  LA diam:        3.40 cm 1.84 cm/m  RA Area:     12.60 cm LA Vol (A2C):   66.6 ml 36.01 ml/m RA Volume:   23.70 ml  12.82 ml/m LA Vol (A4C):   50.5 ml 27.31 ml/m LA Biplane Vol: 59.5 ml 32.17 ml/m  AORTIC VALVE AV Area (Vmax):    2.21 cm AV Area (Vmean):   2.27 cm AV Area (VTI):     2.07 cm AV Vmax:           259.00 cm/s AV Vmean:          170.500 cm/s AV VTI:            0.517 m AV Peak Grad:      26.8 mmHg AV Mean Grad:      13.5 mmHg LVOT Vmax:         182.00 cm/s LVOT Vmean:  123.000 cm/s LVOT VTI:          0.340 m LVOT/AV VTI ratio: 0.66  AORTA Ao Root diam: 3.20 cm MITRAL VALVE                TRICUSPID VALVE MV Area (PHT): 3.21 cm     TR Peak grad:   33.2 mmHg MV Decel Time: 236 msec     TR Vmax:        288.00 cm/s MV E velocity: 104.00 cm/s MV A velocity: 146.00 cm/s  SHUNTS MV E/A ratio:  0.71         Systemic VTI:  0.34 m                             Systemic Diam: 2.00 cm Mihai Croitoru MD Electronically signed by Sanda Klein MD Signature Date/Time: 11/18/2019/3:02:21 PM    Final       Subjective: - no chest pain, shortness of breath, no abdominal pain, nausea or vomiting.   Discharge Exam: BP 125/62 (BP Location: Left Arm)   Pulse 77   Temp 97.7 F (36.5 C) (Oral)   Resp 18   Ht _0  (1.727 m)   Wt 71.7 kg   SpO2 94%   BMI 24.03 kg/m   General: Pt is alert, awake, not in acute distress Cardiovascular: RRR, S1/S2 +, no rubs, no gallops Respiratory: CTA bilaterally, no wheezing, no rhonchi Abdominal: Soft, NT, ND, bowel sounds + Extremities: no edema, no cyanosis    The results of significant diagnostics from this hospitalization (including imaging, microbiology, ancillary and laboratory) are listed below for reference.     Microbiology: Recent Results (from the past 240 hour(s))  Acid Fast Smear (AFB)     Status: None   Collection Time: 11/17/19  5:57 PM   Specimen: Bronchial Alveolar Lavage  Result Value Ref Range Status   AFB Specimen Processing  Concentration  Final   Acid Fast Smear Negative  Final    Comment: (NOTE) Performed At: [facility] [place], [place] [id number] Rush Farmer MD DE:[id number]    Source (AFB) BRONCHIAL ALVEOLAR LAVAGE  Final    Comment: Performed at [facility], [place] [address], [place], [place] [address]  Culture, respiratory     Status: None   Collection Time: 11/17/19  5:57 PM   Specimen: Bronchoalveolar Lavage; Respiratory  Result Value Ref Range Status   Specimen Description BRONCHIAL ALVEOLAR LAVAGE  Final   Special Requests NONE  Final   Gram Stain   Final    FEW WBC PRESENT,BOTH PMN AND MONONUCLEAR NO ORGANISMS SEEN    Culture   Final    Consistent with normal respiratory flora. Performed at [facility], [place] [address], [place], [place] [address]    Report Status 11/19/2019 FINAL  Final  Fungus Culture With Stain     Status: None (Preliminary result)   Collection Time: 11/17/19  5:57 PM   Specimen: Bronchial Alveolar Lavage  Result Value Ref Range Status   Fungus Stain Final report  Final    Comment: (NOTE) Performed At: [facility] [place], [place] [id number] Rush Farmer MD [id number]    Fungus (Mycology) Culture PENDING  Incomplete   Fungal Source BRONCHIAL ALVEOLAR LAVAGE  Final    Comment: Performed at [facility], [place] [address] [place], [place] [address]  Pneumocystis smear by [facility]  Status: None   Collection Time: 11/17/19  5:57 PM   Specimen: Bronchoalveolar Lavage; Respiratory  Result Value Ref Range Status   Specimen Source-PJSRC BRONCHIAL ALVEOLAR LAVAGE  Final   Pneumocystis jiroveci Ag NEGATIVE  Final    Comment: Performed at [facility] Performed at [facility], [address], [place], [place] [address]   Fungus Culture Result     Status: None   Collection Time: 11/17/19  5:57 PM  Result Value Ref Range Status   Result 1 Comment  Final    Comment: (NOTE) KOH/Calcofluor  preparation:  no fungus observed. Performed At: [facility] [place], [place] [id number] Rush Farmer MD [id number]   MRSA PCR Screening     Status: None   Collection Time: 11/18/19  1:12 PM   Specimen: Nasal Mucosa; Nasopharyngeal  Result Value Ref Range Status   MRSA by PCR NEGATIVE NEGATIVE Final    Comment:        The GeneXpert MRSA Assay (FDA approved for NASAL specimens only), is one component of a comprehensive MRSA colonization surveillance program. It is not intended to diagnose MRSA infection nor to guide or monitor treatment for MRSA infections. Performed at [facility], [place] [address] [place], [place] [address]   SARS CORONAVIRUS 2 (TAT 6-24 HRS) Nasopharyngeal Nasopharyngeal Swab     Status: None   Collection Time: 11/25/19  2:02 PM   Specimen: Nasopharyngeal Swab  Result Value Ref Range Status   SARS Coronavirus 2 NEGATIVE NEGATIVE Final    Comment: (NOTE) SARS-CoV-2 target nucleic acids are NOT DETECTED. The SARS-CoV-2 RNA is generally detectable in upper and lower respiratory specimens during the acute phase of infection. Negative results do not preclude SARS-CoV-2 infection, do not rule out co-infections with other pathogens, and should not be used as the sole basis for treatment or other patient management decisions. Negative results must be combined with clinical observations, patient history, and epidemiological information. The expected result is Negative. Fact Sheet for Patients: SugarRoll.be Fact Sheet for Healthcare Providers: https://www.woods-mathews.com/ This test is not yet approved or cleared by the [place] FDA and  has been authorized for detection and/or diagnosis of SARS-CoV-2 by FDA under an Emergency Use Authorization (EUA). This EUA will remain  in effect (meaning this test can be used) for the duration of the COVID-19 declaration under Section 56 4(b)(1)  of the Act, 21 U.S.C. section 360bbb-3(b)(1), unless the authorization is terminated or revoked sooner. Performed at [facility], [place] [address] [place], [place] [address]      Labs: Basic Metabolic Panel: Recent Labs  Lab 11/21/19 0353 11/21/19 1659 11/22/19 0244 11/22/19 0244 11/22/19 2214 11/23/19 0536 11/24/19 0540 11/25/19 0850 11/26/19 0650  NA 138   < > 134*  --   --  135 135 134* 134*  K 3.3*   < > 2.8*   < > 4.0 3.8 3.8 4.2 4.3  CL 99   < > 94*  --   --  95* 91* 91* 91*  CO2 22   < > 28  --   --  _0 GLUCOSE 131*   < > 195*  --   --  158* 114* 155* 120*  BUN 60*   < > 52*  --   --  52* 47* 44* 42*  CREATININE 1.55*   < > 1.34*  --   --  1.55* 1.43* 1.26* 1.37*  CALCIUM 7.8*   < >  7.6*  --   --  8.2* 8.1* 8.5* 8.4*  MG 1.6*  --  1.8  --   --  1.6*  --   --   --    < > = values in this interval not displayed.   Liver Function Tests: Recent Labs  Lab 11/21/19 0353 11/22/19 0244 11/23/19 0536  AST _0 ALT _1 ALKPHOS 129* 126 126  BILITOT 1.2 1.1 0.8  PROT 6.1* 6.3* 6.5  ALBUMIN 2.3* 2.3* 2.4*   CBC: Recent Labs  Lab 11/22/19 0244 11/23/19 0536 11/23/19 1957 11/25/19 0850 11/26/19 0650  WBC 16.8* 17.4* 12.1* 13.6* 12.5*  NEUTROABS  --   --  10.2*  --   --   HGB 10.0* 10.7* 10.6* 11.1* 11.2*  HCT 29.3* 32.1* 31.9* 34.9* 34.5*  MCV 85.4 87.5 86.9 91.1 91.0  PLT 311 396 411* 416* 433*   CBG: Recent Labs  Lab 11/22/19 1729  GLUCAP 179*   Hgb A1c No results for input(s): HGBA1C in the last 72 hours. Lipid Profile No results for input(s): CHOL, HDL, LDLCALC, TRIG, CHOLHDL, LDLDIRECT in the last 72 hours. Thyroid function studies No results for input(s): TSH, T4TOTAL, T3FREE, THYROIDAB in the last 72 hours.  Invalid input(s): FREET3 Urinalysis    Component Value Date/Time   COLORURINE YELLOW 11/18/2019 0816   APPEARANCEUR CLEAR 11/18/2019 0816   LABSPEC 1.015 11/18/2019 0816   PHURINE 5.5 11/18/2019 0816    GLUCOSEU NEGATIVE 11/18/2019 0816   HGBUR SMALL (A) 11/18/2019 0816   BILIRUBINUR NEGATIVE 11/18/2019 0816   BILIRUBINUR negative 11/10/2019 1432   BILIRUBINUR neg 07/31/2013 1752   KETONESUR NEGATIVE 11/18/2019 0816   PROTEINUR 30 (A) 11/18/2019 0816   UROBILINOGEN 0.2 11/10/2019 1432   NITRITE NEGATIVE 11/18/2019 0816   LEUKOCYTESUR SMALL (A) 11/18/2019 0816    FURTHER DISCHARGE INSTRUCTIONS:   Get Medicines reviewed and adjusted: Please take all your medications with you for your next visit with your Primary MD   Laboratory/radiological data: Please request your Primary MD to go over all hospital tests and procedure/radiological results at the follow up, please ask your Primary MD to get all Hospital records sent to his/her office.   In some cases, they will be blood work, cultures and biopsy results pending at the time of your discharge. Please request that your primary care M.D. goes through all the records of your hospital data and follows up on these results.   Also Note the following: If you experience worsening of your admission symptoms, develop shortness of breath, life threatening emergency, suicidal or homicidal thoughts you must seek medical attention immediately by calling 911 or calling your MD immediately  if symptoms less severe.   You must read complete instructions/literature along with all the possible adverse reactions/side effects for all the Medicines you take and that have been prescribed to you. Take any new Medicines after you have completely understood and accpet all the possible adverse reactions/side effects.    Do not drive when taking Pain medications or sleeping medications (Benzodaizepines)   Do not take more than prescribed Pain, Sleep and Anxiety Medications. It is not advisable to combine anxiety,sleep and pain medications without talking with your primary care practitioner   Special Instructions: If you have smoked or chewed Tobacco  in the last 2  yrs please stop smoking, stop any regular Alcohol  and or any Recreational drug use.   Wear Seat belts while driving.   Please note: You were  cared for by a hospitalist during your hospital stay. Once you are discharged, your primary care physician will handle any further medical issues. Please note that NO REFILLS for any discharge medications will be authorized once you are discharged, as it is imperative that you return to your primary care physician (or establish a relationship with a primary care physician if you do not have one) for your post hospital discharge needs so that they can reassess your need for medications and monitor your lab values.  Time coordinating discharge: 40 minutes  SIGNED:  Marzetta Board, MD, PhD 11/27/2019, 8:40 AM

## 2019-11-27 NOTE — TOC Transition Note (Signed)
Transition of Care [facility]) - CM/SW Discharge Note   Patient Details  Name: [name] MRN: [id number] Date of Birth: 12-20-1945  Transition of Care [facility]) CM/SW Contact:  Atilano Median, LCSW Phone Number: 11/27/2019, 9:12 AM   Clinical Narrative:     Discharged to SNF. Referral coordinated with [place]. Patient's son [name] aware and agreeable to this plan. Number to call report [phone] room [id number] given to unit RN Madisyn. DC summary sent via Epic. No other needs at this time. Case closed to this CSW.   Final next level of care: Skilled Nursing Facility Barriers to Discharge: Barriers Resolved   Patient Goals and CMS Choice Patient states their goals for this hospitalization and ongoing recovery are:: get strong enough to get home CMS Medicare.gov Compare Post Acute Care list provided to:: Patient Represenative (must comment)([name] Danielle Rankin) Choice offered to / list presented to : Adult Children  Discharge Placement   Existing PASRR number confirmed : 11/27/19          Patient [facility] bed at: [facility] Patient to be transferred to facility by: [place] Name of family member notified: [name] Patient and family notified of of transfer: 11/27/19  Discharge Plan and Services In-house Referral: Clinical Social Work   Post Acute Care Choice: [place]                               Social Determinants of Health (SDOH) Interventions     Readmission Risk Interventions No flowsheet data found.

## 2019-11-27 NOTE — Progress Notes (Signed)
Patient discharged to [facility] via [place]. Report given to [facility] staff and midline removed prior to discharge. Patient stable upon discharge.

## 2019-11-27 NOTE — Telephone Encounter (Signed)
Patient called and said that she is in rehab at [facility]. She wants to talk to dr. Clovis Pu about her care and what they need to know where she is at. Please give her a call at [phone]

## 2019-11-27 NOTE — Telephone Encounter (Signed)
Call for me and see what her concerns are.

## 2019-11-28 DIAGNOSIS — I110 Hypertensive heart disease with heart failure: Secondary | ICD-10-CM | POA: Diagnosis not present

## 2019-11-28 DIAGNOSIS — R531 Weakness: Secondary | ICD-10-CM | POA: Diagnosis not present

## 2019-11-28 DIAGNOSIS — N179 Acute kidney failure, unspecified: Secondary | ICD-10-CM | POA: Diagnosis not present

## 2019-11-28 DIAGNOSIS — F319 Bipolar disorder, unspecified: Secondary | ICD-10-CM | POA: Diagnosis not present

## 2019-11-28 DIAGNOSIS — R6520 Severe sepsis without septic shock: Secondary | ICD-10-CM | POA: Diagnosis not present

## 2019-11-28 DIAGNOSIS — A4151 Sepsis due to Escherichia coli [E. coli]: Secondary | ICD-10-CM | POA: Diagnosis not present

## 2019-11-28 DIAGNOSIS — I509 Heart failure, unspecified: Secondary | ICD-10-CM | POA: Diagnosis not present

## 2019-11-28 NOTE — Telephone Encounter (Signed)
Spoke with pt. And she became septic. She was hospitalized and is now in a rehab facility. She just wanted to make you aware.

## 2019-12-02 DIAGNOSIS — F319 Bipolar disorder, unspecified: Secondary | ICD-10-CM | POA: Diagnosis not present

## 2019-12-02 DIAGNOSIS — F419 Anxiety disorder, unspecified: Secondary | ICD-10-CM | POA: Diagnosis not present

## 2019-12-02 DIAGNOSIS — G4700 Insomnia, unspecified: Secondary | ICD-10-CM | POA: Diagnosis not present

## 2019-12-03 ENCOUNTER — Other Ambulatory Visit: Payer: Self-pay | Admitting: *Deleted

## 2019-12-03 NOTE — Patient Outreach (Signed)
Screened for potential [facility] Care Management needs as a benefit of  [employer] Medicare.  [name] is currently receiving skilled therapy at [facility].  Writer attended telephonic interdisciplinary team meeting to assess for disposition needs and transition plan for resident.   Facility reports member is having behaviors such as throwing things. Inquired whether member can be seen by psych for potential medication adjustments. Facility SW states she will look into it.   [name]'s son is primary contact. However, he lives in [place]. Facility SW affirms that [employer] son is in the process of looking into what member's living arrangements will be post SNF. He recently sold [name]'s house. Per facility SW potential transition plans are private pay vs ALF vs ILF.   Will continue to follow while member resides at [facility]. Will outreach member's son as appropriate for potential [facility] Care Management needs.    Marthenia Rolling, MSN-Ed, RN,BSN [place] Acute Care Coordinator [phone] [facility]) [phone]  (Toll free office)

## 2019-12-05 ENCOUNTER — Ambulatory Visit (INDEPENDENT_AMBULATORY_CARE_PROVIDER_SITE_OTHER): Payer: Medicare Other | Admitting: Psychiatry

## 2019-12-05 DIAGNOSIS — G4721 Circadian rhythm sleep disorder, delayed sleep phase type: Secondary | ICD-10-CM

## 2019-12-05 DIAGNOSIS — N390 Urinary tract infection, site not specified: Secondary | ICD-10-CM

## 2019-12-05 DIAGNOSIS — G3184 Mild cognitive impairment, so stated: Secondary | ICD-10-CM

## 2019-12-05 DIAGNOSIS — Z8673 Personal history of transient ischemic attack (TIA), and cerebral infarction without residual deficits: Secondary | ICD-10-CM | POA: Diagnosis not present

## 2019-12-05 DIAGNOSIS — F331 Major depressive disorder, recurrent, moderate: Secondary | ICD-10-CM | POA: Diagnosis not present

## 2019-12-05 DIAGNOSIS — I739 Peripheral vascular disease, unspecified: Secondary | ICD-10-CM | POA: Diagnosis not present

## 2019-12-05 DIAGNOSIS — F411 Generalized anxiety disorder: Secondary | ICD-10-CM

## 2019-12-05 NOTE — Progress Notes (Signed)
Psychotherapy Progress Note [facility] Luan Moore, PhD LP  Patient ID: [name]     MRN: [id number] Therapy format: Individual psychotherapy Date: 12/05/2019      Start: 1:10p     Stop: 2:00p     Time Spent: 50 min Location: Telehealth visit -- I connected with this patient by an approved telecommunication method (audio only), with her informed consent, and verifying identity and patient privacy.  I was located at my office and patient at her nursing facility.  As needed, we discussed the limitations, risks, and security and privacy concerns associated with telehealth service, including the availability and conditions which currently govern in-person appointments and the possibility that 3rd-party payment may not be fully guaranteed and she may be responsible for charges.  After she indicated understanding, we proceeded with the session.  Also discussed treatment planning, as needed, including ongoing verbal agreement with the plan, the opportunity to ask and answer all questions, her demonstrated understanding of instructions, and her readiness to call the office should symptoms worsen or she feels she is in a crisis state and needs more immediate and tangible assistance.   Session narrative (presenting needs, interim history, self-report of stressors and symptoms, applications of prior therapy, status changes, and interventions made in session) At [facility] now, guesses 4-5 days (actually at least 8 days).  Was hospitalized for lower urinary tract infection, turned septic, with heart failure, on ventilator for a couple days.  Memory fuzzy, but knows [name] came in.  Best recollection, BIL Nino was the first responder, got her to hospital.  Had 8 hr wait, tried to leave by [place] Po[place] but kept.  Had hallucinations/dreams of seeing [name] which were pleasant, alongside ones of being thrown out of her home and Jewish intelligence agents trying to take her captive.  Still on oxygen at  this point, trying to wean.  Today, also reports she still has burning in her urinary tract.  Says she has notified CNA but not clear whether nurse is aware, does not know her physician's name.  Blood tests last day of the hospital were still abnormal, and she is not sure whether she is on an antibiotic, suggesting she could have a relapsing UTI.  Called this office the day she was transferred (last Thursday 3/18) but couldn't remember her doctors' names.  Advised to make sure she is in touch with the nurse on her unit to call the question whether she still has a UTI and make it know she is having some of the same symptoms that preceded going to the hospital.  Volunteered to message her PCP, in case he is in a position to coordinate or advise.  Re. prior concerns, she rescinded the sale of the house.  The family was all set to pick her up and take her back to [place], est. a week ago, but she doesn't want to go there, feels sure there is no ongoing place for her to stay and she has roots here now, including established health care.  Is not concerned with maintaining her large home at this point, just focused on health care and recovery.  Discussed likelihood that she will need in-home care on release and what to ask for.  Educated on caregiving services, which would most likely be out of pocket, and nursing and therapy services that could come to her home based on documented need and preapproval.  Cautioned against assuming quick or full recovery and raised likelihood she will need regular visitors  and possible assistive care.  Depending on degree of recovery, she could be recommended for residential or assisted living, so urged to let each day's work and status be steps in seeing what her condition will allow, without presupposing or demands.    Therapeutic modalities: Cognitive Behavioral Therapy, Solution-Oriented/Positive Psychology, Ego-Supportive and Psycho-education/Bibliotherapy  Mental  Status/Observations:  Appearance:   Not assessed     Behavior:  Appropriate  Motor:  Not assessed  Speech/Language:   Clear and Coherent  Affect:  Not assessed  Mood:  anxious  Thought process:  normal  Thought content:    some unrealistic expectations  Sensory/Perceptual disturbances:    no delusions/hallucinations at this time  Orientation:  grossly intact  Attention:  Good  Concentration:  Fair  Memory:  grossly intact  Insight:    Fair  Judgment:   Good  Impulse Control:  Fair   Risk Assessment: Danger to Self: No Self-injurious Behavior: No Danger to Others: No Physical Aggression / Violence: No Duty to Warn: No Access to Firearms a concern: No  Assessment of progress:  situational setback(s)  Diagnosis:   ICD-10-CM   1. Generalized anxiety disorder  F41.1   2. Major depressive disorder, recurrent episode, moderate (HCC)  F33.1   3. 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 Complicated UTI (urinary tract infection)  N39.0    unclear if in remission, with recent history of sepsis and delirium  6. History of CVA (cerebrovascular accident)  Z86.73   7. PVD (peripheral vascular disease) (HCC)  I73.9    Plan:  . PT to notify nurse ASAP about suspicious symptoms; [place] will message PCP and psychiatry concerning possible need to verify medication timing and dosage . Work rehab therapies day by day and suspend judgment whether she can count on going home independently or will need to consider alternatives . Other recommendations/advice as may be noted above . Continue to utilize previously learned skills ad lib . Maintain medication as prescribed and work faithfully with relevant prescriber(s) if any changes are desired or seem indicated . Call the clinic on-call service, present to ER, or call 911 if any life-threatening psychiatric crisis Return in about 2 weeks (around 12/19/2019) for available earlier @ PT's need. . Already scheduled  visit in this office 01/02/2020.  Blanchie Serve, PhD Luan Moore, PhD LP Clinical Psychologist, [facility] [address], [place], [place] [address] [phone]

## 2019-12-08 ENCOUNTER — Inpatient Hospital Stay: Payer: Medicare Other | Admitting: Primary Care

## 2019-12-10 ENCOUNTER — Other Ambulatory Visit: Payer: Self-pay | Admitting: *Deleted

## 2019-12-10 NOTE — Patient Outreach (Signed)
Screened for potential [facility] Care Management needs as a benefit of  [employer] Medicare.  [name] is currently receiving skilled therapy at [facility].  Writer attended telephonic interdisciplinary team meeting to assess for disposition needs and transition plan for resident.   Facility SW reports they have issued a [facility] for last covered day 12/12/19 and to transition home on Saturday 12/13/19. States member has appealed. States son did not sell member's home after all. Reports son will come down from [place] over the weekend to assist with member's transition back home with assistance.  [name] will also have [facility]. Will plan outreach to discuss [place] Management and potentially Remote Health services due to need for medication management. Facility SW Psychologist, clinical not to contact member at this time as it may agitate her since case is in appeal.   Will continue to follow and plan outreach accordingly.    Marthenia Rolling, MSN-Ed, RN,BSN [place] Acute Care Coordinator [phone] [facility]) [phone]  (Toll free office)

## 2019-12-11 ENCOUNTER — Encounter: Payer: Self-pay | Admitting: Surgery

## 2019-12-11 DIAGNOSIS — Z9981 Dependence on supplemental oxygen: Secondary | ICD-10-CM | POA: Diagnosis not present

## 2019-12-11 DIAGNOSIS — Z7952 Long term (current) use of systemic steroids: Secondary | ICD-10-CM | POA: Diagnosis not present

## 2019-12-11 DIAGNOSIS — I714 Abdominal aortic aneurysm, without rupture: Secondary | ICD-10-CM | POA: Diagnosis not present

## 2019-12-11 DIAGNOSIS — J84112 Idiopathic pulmonary fibrosis: Secondary | ICD-10-CM | POA: Diagnosis not present

## 2019-12-11 DIAGNOSIS — I2510 Atherosclerotic heart disease of native coronary artery without angina pectoris: Secondary | ICD-10-CM | POA: Diagnosis not present

## 2019-12-11 DIAGNOSIS — J9601 Acute respiratory failure with hypoxia: Secondary | ICD-10-CM | POA: Diagnosis not present

## 2019-12-11 DIAGNOSIS — F319 Bipolar disorder, unspecified: Secondary | ICD-10-CM | POA: Diagnosis not present

## 2019-12-11 DIAGNOSIS — G2581 Restless legs syndrome: Secondary | ICD-10-CM | POA: Diagnosis not present

## 2019-12-11 DIAGNOSIS — E039 Hypothyroidism, unspecified: Secondary | ICD-10-CM | POA: Diagnosis not present

## 2019-12-11 DIAGNOSIS — K589 Irritable bowel syndrome without diarrhea: Secondary | ICD-10-CM | POA: Diagnosis not present

## 2019-12-11 DIAGNOSIS — F419 Anxiety disorder, unspecified: Secondary | ICD-10-CM | POA: Diagnosis not present

## 2019-12-11 DIAGNOSIS — D638 Anemia in other chronic diseases classified elsewhere: Secondary | ICD-10-CM | POA: Diagnosis not present

## 2019-12-11 DIAGNOSIS — I5033 Acute on chronic diastolic (congestive) heart failure: Secondary | ICD-10-CM | POA: Diagnosis not present

## 2019-12-11 DIAGNOSIS — I739 Peripheral vascular disease, unspecified: Secondary | ICD-10-CM | POA: Diagnosis not present

## 2019-12-11 DIAGNOSIS — I110 Hypertensive heart disease with heart failure: Secondary | ICD-10-CM | POA: Diagnosis not present

## 2019-12-11 DIAGNOSIS — Z8673 Personal history of transient ischemic attack (TIA), and cerebral infarction without residual deficits: Secondary | ICD-10-CM | POA: Diagnosis not present

## 2019-12-11 DIAGNOSIS — F329 Major depressive disorder, single episode, unspecified: Secondary | ICD-10-CM | POA: Diagnosis not present

## 2019-12-11 DIAGNOSIS — J449 Chronic obstructive pulmonary disease, unspecified: Secondary | ICD-10-CM | POA: Diagnosis not present

## 2019-12-15 ENCOUNTER — Other Ambulatory Visit: Payer: Self-pay | Admitting: *Deleted

## 2019-12-15 DIAGNOSIS — I10 Essential (primary) hypertension: Secondary | ICD-10-CM

## 2019-12-15 NOTE — Patient Outreach (Signed)
[place] Coordinator follow up  Telephone call made to son [name] [phone]. Patient identifiers confirmed.   [name] reports member is now home with paid caregiver support from 9 am until 6 pm. States they are very pleased with the caregiver. Has [facility]. States [name]'s nephew will come to stay with her full time for 3 weeks starting some time next week. [name] states they will re-evaluate member's situation in 3 weeks.  Explained [place] Management services. [name] is agreeable to [place] for care coordination and [facility] LCSW for mobile meals.   [name] endorses that he will return to [facility] tomorrow. States member's brother in law lives next door. States he is hopeful the brother in law will assist with [name]'s follow up MD appointment on 12/23/19.  [name] also states they are looking into a life alert system for after the caregiver leaves at 6pm in the evenings.  Will make referral for [facility] RNCM and [facility] LCSW. Member has a history of CAD, CVA, UTI, CHF,COPD, bipolar, pulmonary fibrosis.  [name] confirms member's best contact number as [phone].   Marthenia Rolling, MSN-Ed, RN,BSN [place] Acute Care Coordinator [phone] [facility]) [phone]  (Toll free office)

## 2019-12-15 NOTE — Patient Outreach (Signed)
[place] Coordinator follow up  Attempted to verify dc from [facility] via [facility] and call to facility.  Telephone call made to [name] [phone]. No answer. HIPAA compliant voicemail message left.  Telephone call to son [name] [phone] to discuss [facility] services. Patient identifiers confirmed. [name] affirms member is home from [facility]. States they are currently busy with home health. Asked that Probation officer call back.  Marthenia Rolling, MSN-Ed, RN,BSN [place] Acute Care Coordinator [phone] [facility]) [phone]  (Toll free office)

## 2019-12-16 ENCOUNTER — Encounter: Payer: Self-pay | Admitting: *Deleted

## 2019-12-16 ENCOUNTER — Other Ambulatory Visit: Payer: Self-pay | Admitting: *Deleted

## 2019-12-16 LAB — FUNGUS CULTURE RESULT

## 2019-12-16 LAB — FUNGUS CULTURE WITH STAIN

## 2019-12-16 LAB — FUNGAL ORGANISM REFLEX

## 2019-12-16 NOTE — Patient Outreach (Signed)
[facility]) Care Management  12/16/2019  [name] 02-Dec-1945 [id number]   CSW attempted to reach out to pt/family today for initial outreach and was unable to speak with anyone.  CSW was able to leave a HIPPA compliant voice message and will await a return call.  CSW will also make CSW team covering in my absence 12/17/2019- 12/24/2019 to attempt outreach if no return call is received.    CSW will plan to follow up upon return to work 12/25/2019.   Eduard Clos, MSW, [place] Worker  [place] [phone]

## 2019-12-17 DIAGNOSIS — F17210 Nicotine dependence, cigarettes, uncomplicated: Secondary | ICD-10-CM | POA: Diagnosis not present

## 2019-12-17 DIAGNOSIS — E440 Moderate protein-calorie malnutrition: Secondary | ICD-10-CM | POA: Diagnosis not present

## 2019-12-17 DIAGNOSIS — K219 Gastro-esophageal reflux disease without esophagitis: Secondary | ICD-10-CM | POA: Diagnosis not present

## 2019-12-17 DIAGNOSIS — Z85820 Personal history of malignant melanoma of skin: Secondary | ICD-10-CM | POA: Diagnosis not present

## 2019-12-17 DIAGNOSIS — E039 Hypothyroidism, unspecified: Secondary | ICD-10-CM | POA: Diagnosis not present

## 2019-12-17 DIAGNOSIS — J449 Chronic obstructive pulmonary disease, unspecified: Secondary | ICD-10-CM | POA: Diagnosis not present

## 2019-12-17 DIAGNOSIS — M545 Low back pain: Secondary | ICD-10-CM | POA: Diagnosis not present

## 2019-12-17 DIAGNOSIS — F319 Bipolar disorder, unspecified: Secondary | ICD-10-CM | POA: Diagnosis not present

## 2019-12-17 DIAGNOSIS — I739 Peripheral vascular disease, unspecified: Secondary | ICD-10-CM | POA: Diagnosis not present

## 2019-12-17 DIAGNOSIS — M1990 Unspecified osteoarthritis, unspecified site: Secondary | ICD-10-CM | POA: Diagnosis not present

## 2019-12-17 DIAGNOSIS — I5033 Acute on chronic diastolic (congestive) heart failure: Secondary | ICD-10-CM | POA: Diagnosis not present

## 2019-12-17 DIAGNOSIS — Z8673 Personal history of transient ischemic attack (TIA), and cerebral infarction without residual deficits: Secondary | ICD-10-CM | POA: Diagnosis not present

## 2019-12-17 DIAGNOSIS — K581 Irritable bowel syndrome with constipation: Secondary | ICD-10-CM | POA: Diagnosis not present

## 2019-12-17 DIAGNOSIS — J9601 Acute respiratory failure with hypoxia: Secondary | ICD-10-CM | POA: Diagnosis not present

## 2019-12-17 DIAGNOSIS — G2581 Restless legs syndrome: Secondary | ICD-10-CM | POA: Diagnosis not present

## 2019-12-17 DIAGNOSIS — Z87440 Personal history of urinary (tract) infections: Secondary | ICD-10-CM | POA: Diagnosis not present

## 2019-12-17 DIAGNOSIS — E876 Hypokalemia: Secondary | ICD-10-CM | POA: Diagnosis not present

## 2019-12-17 DIAGNOSIS — J84112 Idiopathic pulmonary fibrosis: Secondary | ICD-10-CM | POA: Diagnosis not present

## 2019-12-17 DIAGNOSIS — Z7901 Long term (current) use of anticoagulants: Secondary | ICD-10-CM | POA: Diagnosis not present

## 2019-12-17 DIAGNOSIS — Z7952 Long term (current) use of systemic steroids: Secondary | ICD-10-CM | POA: Diagnosis not present

## 2019-12-17 DIAGNOSIS — I110 Hypertensive heart disease with heart failure: Secondary | ICD-10-CM | POA: Diagnosis not present

## 2019-12-17 DIAGNOSIS — L409 Psoriasis, unspecified: Secondary | ICD-10-CM | POA: Diagnosis not present

## 2019-12-17 DIAGNOSIS — I2510 Atherosclerotic heart disease of native coronary artery without angina pectoris: Secondary | ICD-10-CM | POA: Diagnosis not present

## 2019-12-17 DIAGNOSIS — K2990 Gastroduodenitis, unspecified, without bleeding: Secondary | ICD-10-CM | POA: Diagnosis not present

## 2019-12-18 ENCOUNTER — Telehealth: Payer: Self-pay | Admitting: Family Medicine

## 2019-12-18 DIAGNOSIS — I2510 Atherosclerotic heart disease of native coronary artery without angina pectoris: Secondary | ICD-10-CM | POA: Diagnosis not present

## 2019-12-18 DIAGNOSIS — F319 Bipolar disorder, unspecified: Secondary | ICD-10-CM | POA: Diagnosis not present

## 2019-12-18 DIAGNOSIS — I110 Hypertensive heart disease with heart failure: Secondary | ICD-10-CM | POA: Diagnosis not present

## 2019-12-18 DIAGNOSIS — J9601 Acute respiratory failure with hypoxia: Secondary | ICD-10-CM | POA: Diagnosis not present

## 2019-12-18 DIAGNOSIS — J449 Chronic obstructive pulmonary disease, unspecified: Secondary | ICD-10-CM | POA: Diagnosis not present

## 2019-12-18 DIAGNOSIS — I5033 Acute on chronic diastolic (congestive) heart failure: Secondary | ICD-10-CM | POA: Diagnosis not present

## 2019-12-18 NOTE — Telephone Encounter (Signed)
Pt had a stay in the hospital she needs her prednisone and her Lasix they did not renew these for her, thank you

## 2019-12-18 NOTE — Telephone Encounter (Signed)
Per d/c summary on 11/27/19: "Continue Prednisone taper as below, 30 mg daily until 3/22, then 20 mg for 7 days until 3/29 then 10 mg daily for 14 days then stop" It appears the prescriptions were sent from the hospital provider to [facility] on [address]. Stat date of 12/09/19  Can we verify if that prescription was picked up/filled?  As far as Lasix, on med list review it appears 30 tablets of 40 mg were prescribed from hospital discharge on March 18, also at [facility].  Please check status of prescriptions, and let me know if we need to resend anything.  Thank you.

## 2019-12-18 NOTE — Telephone Encounter (Signed)
Pt was admitted to the hospital at the  beginning of  march and release on 12/14/19 from [facility]. They didn't send her home with any lasix  or predniSONE  and no prescriptions  for either. Langley Gauss would like [place] to sign pt's orders. They are going to be faxed over when completed for a signature. Please advise Langley Gauss at [phone] from [place] home health care.

## 2019-12-19 ENCOUNTER — Other Ambulatory Visit: Payer: Self-pay | Admitting: *Deleted

## 2019-12-19 DIAGNOSIS — F319 Bipolar disorder, unspecified: Secondary | ICD-10-CM | POA: Diagnosis not present

## 2019-12-19 DIAGNOSIS — J9601 Acute respiratory failure with hypoxia: Secondary | ICD-10-CM | POA: Diagnosis not present

## 2019-12-19 DIAGNOSIS — J449 Chronic obstructive pulmonary disease, unspecified: Secondary | ICD-10-CM | POA: Diagnosis not present

## 2019-12-19 DIAGNOSIS — I110 Hypertensive heart disease with heart failure: Secondary | ICD-10-CM | POA: Diagnosis not present

## 2019-12-19 DIAGNOSIS — I2510 Atherosclerotic heart disease of native coronary artery without angina pectoris: Secondary | ICD-10-CM | POA: Diagnosis not present

## 2019-12-19 DIAGNOSIS — I5033 Acute on chronic diastolic (congestive) heart failure: Secondary | ICD-10-CM | POA: Diagnosis not present

## 2019-12-19 MED ORDER — PREDNISONE 10 MG PO TABS: 10.0000 mg | ORAL_TABLET | Freq: Every day | ORAL | 0 refills | Status: DC

## 2019-12-19 MED ORDER — FUROSEMIDE 40 MG PO TABS: 40.0000 mg | ORAL_TABLET | Freq: Every day | ORAL | 0 refills | Status: DC

## 2019-12-19 NOTE — Telephone Encounter (Signed)
Left message for the pt let her know Rx is at pharmacy

## 2019-12-19 NOTE — Telephone Encounter (Signed)
Sent prednisone 10mg  #4 as should be day 11 of 14 of final taper.  Sent lasix 40mg  #10 for now until follow up and to determine further plan for diuretic.

## 2019-12-19 NOTE — Telephone Encounter (Signed)
Pt called state her med it not in the pharmacy [phone] Please Advise.

## 2019-12-19 NOTE — Patient Outreach (Signed)
[facility]) Care Management  12/19/2019  [name] [birth year] [id number]  Referred: 12/16/2019 Initial outreach 12/19/2019 Discharged from SNF on 12/14/2019  RN completed a transition of care call today and explained the purpose for today's call. Explained [facility] services with other disciplines available to assist for social worker or pharmacy needs. Pt receptive and explained other involved services related to [facility] with PT/OT/RN involvement. States the nurse has visited and educated her on COPD and CHF (new diagnose).   Pt explained her limited support system with local family of a niece and brother-in-law. Others are her son [name]) who in [place] and a nephew who is a Therapist, sports that will be here from [facility]) soon to stay with pt for approximately 3 weeks to assist as needed. Pt express her needs for transportation to go get groceries that she may have covered on this occasion from family but interested in available resources for meals. RN informed pt that [facility] social worker Eduard Clos) has attempted outreach however unsuccessful several days ago. No immediate needs but continues to want additional resources.  Pt also informed if immediate needs the [facility] agency also has available social workers to assist with her ongoing needs.  RN inquired on her ongoing medical needs with monitoring her COPD. Pt reports this is controlled with needs however mentioned a new diagnosis of CHF. States her [facility] was visiting and left her information on COPD and CHF that she will read. RN further inquired and educated pt on the possible signs and symptoms and what to do if acute by contact her provider. RN discussed the importance of daily weights and when to contact her provider with 3 lbs overnight or 5 lbs within one week with any related symptoms of SOB, swelling to her legs/hands or trunk area making it difficult for her to breath. Pt familiar with some of the symptoms. Offered to send additional  EMMI education packets to reiterate on the learning of CHF (pt receptive).Offered to send [facility] packet and calendar that contains COPD and CHF information with zones to follow for high risk and what to do if acute (pt receptive). Offered to enter pt into a [facility] program that she felt she needed closer monitoring. Pt choose CHF. RN discussed the importance of daily weights to due to fluid retention that can occur and inquired if pt weighs daily. Pt indicated she did not weight this morning. Stress the importance and inform pt to weight in the AM upon arriving after she empty's her bladder for a dry weight. Encouraged dialy weights and will send a [facility] calendar for daily documentation for her provider to view. Pt verbalized the risk if not monitored.    Pt states other issues related not having all her medications. States her [facility] is following up on this task and her provider was contacted with the request to fill some of her medications especially her fluid medication (Lasix). Denies any symptoms of fluid retention at this time however stress the importance of needing this medications to avoid acute CHF symptoms from occurring. Verified pt is aware of contact the provider on call if she needs to continuing addressing this issues along with the [facility] on call.  Pt also reports she now has the medical alert sytem for acute events if needed.   Plan of care discussed and generated along with goals and interventions. Will enroll pt and offered another follow up call in a few weeks to completed the initial assessment. Offered to follow up weekly  however pt has requested monthly follow up calls would be sufficient at this time. Will also notify pt's provider of pt's disposition with [facility] services.  THN CM Care Plan Problem One     Most Recent Value  Care Plan Problem One  Deficient knowledge related to CHF unfamiliaity with information /education  Role Documenting the Problem One  Care Management Telephonic Coordinator   Care Plan for Problem One  Active  THN Long Term Goal   Pt will verbalize two symptoms of CHF exacerbation within the next 90 days.  THN Long Term Goal Start Date  12/19/19  Interventions for Problem One Long Term Goal  Will educate on the HF zones and verify pt remains in the GREEN. Will send education emmi on HF and encouraged review of this informaion to prevent acute symptoms from ocurring.  THN CM Short Term Goal #1   Medication adherence within the next 2 weeks  THN CM Short Term Goal #1 Start Date  12/19/19  Interventions for Short Term Goal #1  Will verify pt has followed up with her provider on the needed refills and strongly encouraged pt to continue to follow up with this request due to her ongoing medical condition with CHF.  THN CM Short Term Goal #2   Pt will verbalize maintaining wieht within 2 lbs of her goal within the next 30 days.  THN CM Short Term Goal #2 Start Date  12/19/19  Interventions for Short Term Goal #2  Will strongly encouraged pt on daily weights and educate on the importance of preventing fuild retention related to HF due to the complications and risk involved with preciptating symptoms      Raina Mina, RN Care Management Coordinator [place] Office [phone]

## 2019-12-19 NOTE — Telephone Encounter (Signed)
Contacted [address] [facility] and verified last fill date for Prednisone was Oct 2020, and has no Lasix on file system wide. Please advice

## 2019-12-22 ENCOUNTER — Other Ambulatory Visit: Payer: Self-pay | Admitting: *Deleted

## 2019-12-22 NOTE — Patient Outreach (Signed)
[facility]) Care Management  12/22/2019  TAYTUM LEGACY 30-Sep-1945 [id number]   CSW covering for Eduard Clos, LCSW colleague, also with [facility], attempted to make a second outreach attempt to patient today to perform the initial phone assessment, as well as assess and assist with social work needs and services; however, patient was unavailable at the time of CSW's call.  CSW was unable to leave a HIPAA compliant message on voicemail, as patient's phone just continued to ring and ring, with no answer.  CSW will encourage Ms. Marcelline Deist to make a third and final outreach attempt to patient within the next 3-4 business days.   Nat Christen, BSW, MSW, LCSW  Licensed Education officer, [facility]  Mailing [place] [address] [place], [place] [address] Physical Address-[address], [place], [place] [address] Toll Free Main # [phone] Fax # [phone] Cell # [phone]  Office # [phone] [email or web address]

## 2019-12-23 ENCOUNTER — Inpatient Hospital Stay: Payer: Medicare Other | Admitting: Registered Nurse

## 2019-12-23 DIAGNOSIS — F319 Bipolar disorder, unspecified: Secondary | ICD-10-CM | POA: Diagnosis not present

## 2019-12-23 DIAGNOSIS — I5033 Acute on chronic diastolic (congestive) heart failure: Secondary | ICD-10-CM | POA: Diagnosis not present

## 2019-12-23 DIAGNOSIS — I2510 Atherosclerotic heart disease of native coronary artery without angina pectoris: Secondary | ICD-10-CM | POA: Diagnosis not present

## 2019-12-23 DIAGNOSIS — J9601 Acute respiratory failure with hypoxia: Secondary | ICD-10-CM | POA: Diagnosis not present

## 2019-12-23 DIAGNOSIS — J449 Chronic obstructive pulmonary disease, unspecified: Secondary | ICD-10-CM | POA: Diagnosis not present

## 2019-12-23 DIAGNOSIS — I110 Hypertensive heart disease with heart failure: Secondary | ICD-10-CM | POA: Diagnosis not present

## 2019-12-24 ENCOUNTER — Encounter: Payer: Self-pay | Admitting: Registered Nurse

## 2019-12-24 DIAGNOSIS — J449 Chronic obstructive pulmonary disease, unspecified: Secondary | ICD-10-CM | POA: Diagnosis not present

## 2019-12-24 DIAGNOSIS — I110 Hypertensive heart disease with heart failure: Secondary | ICD-10-CM | POA: Diagnosis not present

## 2019-12-24 DIAGNOSIS — I2510 Atherosclerotic heart disease of native coronary artery without angina pectoris: Secondary | ICD-10-CM | POA: Diagnosis not present

## 2019-12-24 DIAGNOSIS — I5033 Acute on chronic diastolic (congestive) heart failure: Secondary | ICD-10-CM | POA: Diagnosis not present

## 2019-12-24 DIAGNOSIS — J9601 Acute respiratory failure with hypoxia: Secondary | ICD-10-CM | POA: Diagnosis not present

## 2019-12-24 DIAGNOSIS — F319 Bipolar disorder, unspecified: Secondary | ICD-10-CM | POA: Diagnosis not present

## 2019-12-25 ENCOUNTER — Encounter: Payer: Self-pay | Admitting: Family Medicine

## 2019-12-25 ENCOUNTER — Telehealth (INDEPENDENT_AMBULATORY_CARE_PROVIDER_SITE_OTHER): Payer: Medicare Other | Admitting: Family Medicine

## 2019-12-25 ENCOUNTER — Other Ambulatory Visit: Payer: Self-pay

## 2019-12-25 VITALS — Ht 68.0 in | Wt 159.0 lb

## 2019-12-25 DIAGNOSIS — A419 Sepsis, unspecified organism: Secondary | ICD-10-CM

## 2019-12-25 DIAGNOSIS — N189 Chronic kidney disease, unspecified: Secondary | ICD-10-CM | POA: Diagnosis not present

## 2019-12-25 DIAGNOSIS — R6521 Severe sepsis with septic shock: Secondary | ICD-10-CM

## 2019-12-25 DIAGNOSIS — N179 Acute kidney failure, unspecified: Secondary | ICD-10-CM | POA: Diagnosis not present

## 2019-12-25 DIAGNOSIS — J9621 Acute and chronic respiratory failure with hypoxia: Secondary | ICD-10-CM | POA: Diagnosis not present

## 2019-12-25 NOTE — Patient Instructions (Addendum)
Continue home oxygen for now to keep oxygen above 88.  Follow up in office tomorrow. Can check labs then.  Go to emergency room if any of your symptoms worsen or new symptoms occur overnight.    If you have lab work done today you will be contacted with your lab results within the next 2 weeks.  If you have not heard from [place] then please contact us. The fastest way to get your results is to register for My Chart.   IF you received an x-ray today, you will receive an invoice from [facility] Radiology. Please contact [facility] Radiology at [phone] with questions or concerns regarding your invoice.   IF you received labwork today, you will receive an invoice from [place]. Please contact [facility] at [phone] with questions or concerns regarding your invoice.   Our billing staff will not be able to assist you with questions regarding bills from these companies.  You will be contacted with the lab results as soon as they are available. The fastest way to get your results is to activate your My Chart account. Instructions are located on the last page of this paperwork. If you have not heard from [place] regarding the results in 2 weeks, please contact this office.

## 2019-12-25 NOTE — Progress Notes (Signed)
Virtual Visit via audio Note  I connected with [name] on 12/25/19 at 2:01 PM by audio - she was unable to connect by video. and verified that I am speaking with the correct person using two identifiers.   I discussed the limitations, risks, security and privacy concerns of performing an evaluation and management service by telephone and the availability of in person appointments. I also discussed with the patient that there may be a patient responsible charge related to this service. The patient expressed understanding and agreed to proceed, consent obtained  Chief complaint:  Chief Complaint  Patient presents with  . Hospitalization Follow-up    pt states she went there for a uti and bumps. pt states she was told she had ecoli and phnomina. pt states her O2  has been low and pt dosn't have any portable O2.    History of Present Illness: [name] is a 74 y.o. female  Hospital follow-up: 10 minutes chart review  Admitted March 1 through March 18.  Discharged to skilled nursing facility, [facility] until about 6 days ago.   Septic shock due to Pyelo/E. coli bacteremia 9 days of IV cefepime during hospitalization, culture March 7 without growth.  Initial blood culture showing E. coli, pansensitive.   Acute kidney injury secondary to ATN in setting of septic shock. Nephrology consulted.  Creatinine peaked to 4.1, baseline 0.9.  Creatinine was 1.2-1.4 at discharge, thought to be new baseline.  On Lasix as recommended by pulmonary.  Acute hypoxic respiratory failure, acute on chronic diastolic CHF, idiopathic pulmonary fibrosis, COPD. Pulmonary Dr. Melvyn Novas, Dr. Gala Murdoch.  Hypoxic in the hospital, in the setting of E. coli sepsis.  Up to 15 L of oxygen needed.  Found to have possible nonspecific diffuse lung injury on top of underlying disease.  Bronchoscopy/bronchoalveolar lavage unremarkable.  5-week prednisone taper planned, furosemide daily, home oxygen, follow-up planned with  pulmonary in a few weeks from hospital discharge. I did send final portion of prednisone taper starting last Saturday at 10 mg daily for 4 days to complete planned taper(day 11 of 14 at that time).  Additionally sent 40 mg Lasix No. 10 until follow-up plan determined regarding Lasix. No recent pulmonary eval noted, it appears she did have an appointment March 29 that she canceled. Has been taking prednisone.  Was on oxygen as needed at [facility], has O2 concentrator at home. Using as needed. Very seldom until yesterday. O2 90-95 usually on room air.  Did not have to use much , then started dropping yesterday to 85, 83. Dyspneic at that time. Feels worse today - more short of breath today. Oxygen in 80's today. Improved to 100% with oxygen at 2 liters.  Has been taking furosemide. Less leg swelling  Persistent cough - more cough past few days.  No fever.  Not smoking anymore. Chronic nausea, no vomiting.   Hypokalemia, hypomagnesemia repleted, plan for periodic monitoring. Lab Results  Component Value Date   NA 134 (L) 11/26/2019   K 4.3 11/26/2019   CL 91 (L) 11/26/2019   CO2 29 11/26/2019   Lab Results  Component Value Date   CREATININE 1.37 (H) 11/26/2019    Depression: Followed by psychiatry and counseling, appt scheduled next week.     Patient Active Problem List   Diagnosis Date Noted  . Bilateral pulmonary infiltrates on CXR   . Acute hypoxemic respiratory failure ([place])   . Complicated UTI (urinary tract infection) 11/11/2019  . AKI (acute kidney injury) ([place])  11/11/2019  . Hyponatremia 11/11/2019  . Elevated LFTs 11/11/2019  . Sepsis ([place]) 11/11/2019  . Wound infection 11/07/2018  . Postoperative pain   . Sleep disturbance   . Tobacco abuse   . Acute blood loss anemia   . Hypoalbuminemia due to protein-calorie malnutrition ([place])   . Debility 10/24/2018  . Pre-operative cardiovascular examination 09/26/2018  . HLD (hyperlipidemia) 09/26/2018  . GAD (generalized  anxiety disorder) 08/07/2018  . Major depressive disorder, single episode 08/07/2018  . Appendiceal abscess 07/08/2018  . PVD (peripheral vascular disease) ([place]) 03/15/2018  . Atherosclerosis of native artery of left lower extremity with intermittent claudication ([place]) 03/15/2018  . Chronic left-sided low back pain with left-sided sciatica 12/25/2017  . Facial droop   . History of CVA (cerebrovascular accident) 10/23/2017  . Critical lower limb ischemia 11/08/2016  . Smoker 11/08/2016  . Postinflammatory pulmonary fibrosis ([place]) 02/14/2016  . Cigarette smoker 12/24/2015  . Hypothyroid ? 01/16/2014  . Mild cognitive impairment 01/16/2014  . Meningioma ([place]) 10/21/2013  . Chest pain 10/20/2013  . Medicare annual wellness visit, subsequent 06/16/2013  . Dizziness and giddiness 06/16/2013  . RLS (restless legs syndrome) 04/29/2012  . Abdominal pain 12/27/2010  . DEGENERATIVE JOINT DISEASE 09/23/2010  . CAROTID ARTERY DISEASE 08/15/2010  . CHEST PAIN 08/15/2010  . HEADACHE 12/02/2009  . BACK PAIN 11/22/2009  . CONSTIPATION, CHRONIC 01/29/2008  . NAUSEA 01/28/2008  . DEPRESSION 03/08/2007  . HTN (hypertension) 03/08/2007   Past Medical History:  Diagnosis Date  . AAA (abdominal aortic aneurysm) (HCC)    3.1 cm 07/08/18, 3 year follow-up recommended; possible 3 cm AAA by aortogram 09/13/18  . Anemia    PMH  . Appendicitis with abscess    07/08/18, s/p perc drain; resolved 07/30/18 by CT  . Arthritis   . Bipolar disorder ([place])   . Cerebrovascular disease    intra and extracranial vascular dx per MRI 4/11, neurology rec strict CVRF control  . Colonic inertia   . Constipation    chronic;severe  . Coronary artery disease   . Depression   . Duodenitis   . EKG abnormalities    changes, stress test neg (false EKG changes)  . Gastritis   . GERD (gastroesophageal reflux disease)   . Hypertension   . Hypothyroid 01/16/2014  . Meningioma ([place]) 10/21/2013  . PAD (peripheral artery  disease) ([place])   . Psoriasis    sees derm  . Stroke ([place])   . Wears dentures   . Wears glasses    Past Surgical History:  Procedure Laterality Date  . ABDOMINAL AORTOGRAM W/LOWER EXTREMITY N/A 09/13/2018   Procedure: ABDOMINAL AORTOGRAM W/LOWER EXTREMITY;  Surgeon: Elam Dutch, MD;  Location: [facility];  Service: Cardiovascular;  Laterality: N/A;  . arthroscopy  04/2010   Right knee  . CATARACT EXTRACTION W/ INTRAOCULAR LENS  IMPLANT, BILATERAL    . COLONOSCOPY    . FEMORAL-POPLITEAL BYPASS GRAFT  10/22/2018  . FEMORAL-POPLITEAL BYPASS GRAFT Left 10/22/2018   Procedure: LEFT FEMORAL TO BELOW THE KNEE POPLITEAL ARTERY BYPASS GRAFT;  Surgeon: Elam Dutch, MD;  Location: [facility];  Service: Vascular;  Laterality: Left;  . I & D EXTREMITY Left 11/08/2018   Procedure: IRRIGATION AND DEBRIDEMENT EXTREMITY Left Leg;  Surgeon: Elam Dutch, MD;  Location: [facility];  Service: Vascular;  Laterality: Left;  . IR RADIOLOGIST EVAL & MGMT  07/30/2018  . MULTIPLE TOOTH EXTRACTIONS    . TUBAL LIGATION     No Known Allergies  Prior to Admission medications   Medication Sig Start Date End Date Taking? Authorizing Provider  amLODipine (NORVASC) 2.5 MG tablet Take 1 tablet (2.5 mg total) by mouth daily. 11/28/19  Yes Gherghe, Vella Redhead, MD  clonazePAM (KLONOPIN) 0.5 MG tablet Take 1 tablet (0.5 mg total) by mouth 2 (two) times daily as needed for anxiety. TAKE 1 TABLET BY MOUTH AT BEDTIME FOR ANXIETY 11/27/19  Yes Gherghe, Vella Redhead, MD  DULoxetine (CYMBALTA) 30 MG capsule Take 1 capsule (30 mg total) by mouth every evening. 04/15/19  Yes Cottle, Billey Co., MD  famotidine (PEPCID) 20 MG tablet One at bedtime Patient taking differently: Take 20 mg by mouth at bedtime. One at bedtime 02/14/16  Yes Tanda Rockers, MD  furosemide (LASIX) 40 MG tablet Take 1 tablet (40 mg total) by mouth daily. 12/19/19  Yes [name], MD  lamoTRIgine (LAMICTAL) 150 MG tablet TAKE 1 TABLET(150 MG) BY MOUTH  TWICE DAILY Patient taking differently: Take 75 mg by mouth daily.  05/23/19  Yes Cottle, Billey Co., MD  linaclotide [facility]) 145 MCG CAPS capsule Take 1 capsule (145 mcg total) by mouth daily before breakfast. 05/14/19  Yes Willia Craze, NP  Melatonin 10 MG TABS Take 20 mg by mouth at bedtime.   Yes [provider]  polyethylene glycol powder (GLYCOLAX/MIRALAX) powder Take 17 g by mouth daily as needed for moderate constipation.    Yes [provider]  predniSONE (DELTASONE) 10 MG tablet Take 1 tablet (10 mg total) by mouth daily with breakfast. 12/19/19  Yes [name], MD  rivaroxaban (XARELTO) 20 MG TABS tablet Take 1 tablet (20 mg total) by mouth daily with supper. 10/02/19  Yes Fields, Jessy Oto, MD  rOPINIRole (REQUIP) 2 MG tablet TAKE 1 AND 1/2 TABLETS(3 MG) BY MOUTH AT BEDTIME Patient taking differently: Take 3 mg by mouth at bedtime.  10/31/19  Yes Cottle, Billey Co., MD   Social History   Socioeconomic History  . Marital status: Widowed    Spouse name: Not on file  . Number of children: 1  . Years of education: Not on file  . Highest education level: Not on file  Occupational History  . Occupation: retired  Tobacco Use  . Smoking status: Former Smoker    Packs/day: 0.75    Years: 60.00    Pack years: 45.00    Types: Cigarettes  . Smokeless tobacco: Never Used  Substance and Sexual Activity  . Alcohol use: Yes    Alcohol/week: 0.0 standard drinks    Comment: yes on occassion  . Drug use: No  . Sexual activity: Not Currently  Other Topics Concern  . Not on file  Social History Narrative   Brother in law Mr Rexford Maus (one of my patients)   Lives w/ husband       Social Determinants of Health   Financial Resource Strain:   . Difficulty of Paying Living Expenses:   Food Insecurity:   . Worried About Charity fundraiser in the Last Year:   . Arboriculturist in the Last Year:   Transportation Needs:   . Film/video editor  (Medical):   Marland Kitchen Lack of Transportation (Non-Medical):   Physical Activity:   . Days of Exercise per Week:   . Minutes of Exercise per Session:   Stress:   . Feeling of Stress :   Social Connections:   . Frequency of Communication with Friends and Family:   .  Frequency of Social Gatherings with Friends and Family:   . Attends Religious Services:   . Active Member of Clubs or Organizations:   . Attends Archivist Meetings:   Marland Kitchen Marital Status:   Intimate Partner Violence:   . Fear of Current or Ex-Partner:   . Emotionally Abused:   Marland Kitchen Physically Abused:   . Sexually Abused:     Observations/Objective: Vitals:   12/25/19 1321  Weight: 159 lb (72.1 kg)  Height: _0  (1.727 m)  Speaking in full sentences over the phone, intelligible speech, appropriate responses.  No apparent distress over audio.   Assessment and Plan: Septic shock ([place])  Acute on chronic respiratory failure with hypoxia (HCC)  Acute kidney injury superimposed on chronic kidney disease (HCC)  Increased O2 requirement past few days. No distress on phone.   -in office eval tomorrow with possible CXR and updated labs for creatinine, mag and potassium for prior deficiencies.   - ER precautions overnight discussed.   Follow Up Instructions: 1 day with Judson Roch.    I discussed the assessment and treatment plan with the patient. The patient was provided an opportunity to ask questions and all were answered. The patient agreed with the plan and demonstrated an understanding of the instructions.   The patient was advised to call back or seek an in-person evaluation if the symptoms worsen or if the condition fails to improve as anticipated.  I provided 28 minutes of non-face-to-face time during this encounter.   [name], MD

## 2019-12-26 ENCOUNTER — Encounter: Payer: Self-pay | Admitting: Adult Health Nurse Practitioner

## 2019-12-26 ENCOUNTER — Telehealth: Payer: Self-pay | Admitting: Family Medicine

## 2019-12-26 ENCOUNTER — Other Ambulatory Visit: Payer: Self-pay

## 2019-12-26 ENCOUNTER — Ambulatory Visit (INDEPENDENT_AMBULATORY_CARE_PROVIDER_SITE_OTHER): Payer: Medicare Other

## 2019-12-26 ENCOUNTER — Ambulatory Visit (INDEPENDENT_AMBULATORY_CARE_PROVIDER_SITE_OTHER): Payer: Medicare Other | Admitting: Adult Health Nurse Practitioner

## 2019-12-26 VITALS — BP 139/75 | HR 83 | Temp 97.8°F | Wt 162.6 lb

## 2019-12-26 DIAGNOSIS — J9601 Acute respiratory failure with hypoxia: Secondary | ICD-10-CM

## 2019-12-26 DIAGNOSIS — R0602 Shortness of breath: Secondary | ICD-10-CM | POA: Diagnosis not present

## 2019-12-26 DIAGNOSIS — J9621 Acute and chronic respiratory failure with hypoxia: Secondary | ICD-10-CM

## 2019-12-26 DIAGNOSIS — I5033 Acute on chronic diastolic (congestive) heart failure: Secondary | ICD-10-CM

## 2019-12-26 DIAGNOSIS — A4151 Sepsis due to Escherichia coli [E. coli]: Secondary | ICD-10-CM | POA: Diagnosis not present

## 2019-12-26 DIAGNOSIS — J9611 Chronic respiratory failure with hypoxia: Secondary | ICD-10-CM | POA: Insufficient documentation

## 2019-12-26 DIAGNOSIS — I110 Hypertensive heart disease with heart failure: Secondary | ICD-10-CM | POA: Diagnosis not present

## 2019-12-26 DIAGNOSIS — N179 Acute kidney failure, unspecified: Secondary | ICD-10-CM | POA: Diagnosis not present

## 2019-12-26 DIAGNOSIS — F319 Bipolar disorder, unspecified: Secondary | ICD-10-CM | POA: Diagnosis not present

## 2019-12-26 DIAGNOSIS — I2510 Atherosclerotic heart disease of native coronary artery without angina pectoris: Secondary | ICD-10-CM | POA: Diagnosis not present

## 2019-12-26 DIAGNOSIS — R6521 Severe sepsis with septic shock: Secondary | ICD-10-CM | POA: Diagnosis not present

## 2019-12-26 DIAGNOSIS — J449 Chronic obstructive pulmonary disease, unspecified: Secondary | ICD-10-CM | POA: Diagnosis not present

## 2019-12-26 DIAGNOSIS — R918 Other nonspecific abnormal finding of lung field: Secondary | ICD-10-CM

## 2019-12-26 MED ORDER — LEVOFLOXACIN 750 MG PO TABS: 750.0000 mg | ORAL_TABLET | Freq: Every day | ORAL | 0 refills | Status: AC

## 2019-12-26 MED ORDER — PREDNISONE 20 MG PO TABS: ORAL_TABLET | ORAL | 0 refills | Status: DC

## 2019-12-26 MED ORDER — ONDANSETRON 4 MG PO TBDP: 4.0000 mg | ORAL_TABLET | Freq: Three times a day (TID) | ORAL | 0 refills | Status: DC | PRN

## 2019-12-26 NOTE — Progress Notes (Signed)
Chief Complaint  Patient presents with  . Hospitalization Follow-up    Septic    HPI   Patient follows up today in clinic.  She has had a bump in her oxygen demand requiring 2L to get to 100%Sp02.  She communicates that she is coughing more but feels better than when in hospital.  Presents with her brother-in-law who helps take care of her.  Reviewed with Dr. Nyoka Cowden who is in the office and available while treating the patient.  She notes that her coughing and shortness of breath has increased.  Questions why she needs to go to the cardiologist.  We reviewed her heart failure in light of her acute respiratory failure.  She has not had any cigarettes since discharge from the hospital on thinking that she almost died.  She is resting at home, moving around okay but not great.  Using an assistive device.,  Walker, and cane.  She has had some intermittent chest pain centrally without radiation.  Shortness of breath is related to the acute respiratory failure and recovery.  Problem List    Problem List: 6122-44: Complicated UTI (urinary tract infection) 2021-03: AKI (acute kidney injury) ([place]) 2021-03: Hyponatremia 2021-03: Elevated LFTs 2021-03: Sepsis ([place]) 2020-02: Wound infection 2020-02: Debility 2020-01: Pre-operative cardiovascular examination 2020-01: HLD (hyperlipidemia) 2019-11: GAD (generalized anxiety disorder) 2019-11: Major depressive disorder, single episode 2019-10: Appendiceal abscess 2019-07: PVD (peripheral vascular disease) ([place]) 2019-07: Atherosclerosis of native artery of left lower extremity  with intermittent claudication ([place]) 2019-04: Chronic left-sided low back pain with left-sided sciatica 2019-02: History of CVA (cerebrovascular accident) 2018-02: Critical lower limb ischemia 2018-02: Smoker 2017-06: Postinflammatory pulmonary fibrosis ([place]) 2017-04: Cigarette smoker 2015-05: Hypothyroid ? 2015-05: Mild cognitive impairment 2015-02: Meningioma  ([place]) 2015-02: Chest pain 2014-10: Medicare annual wellness visit, subsequent 2014-10: Dizziness and giddiness 2013-08: RLS (restless legs syndrome) 2012-04: Abdominal pain 2012-01: DEGENERATIVE JOINT DISEASE 2011-12: CAROTID ARTERY DISEASE 2011-12: CHEST PAIN 2011-03: HEADACHE 2011-03: BACK PAIN 2009-05: CONSTIPATION, CHRONIC 2009-05: NAUSEA 2008-06: DEPRESSION 2008-06: HTN (hypertension) Facial droop Acute blood loss anemia Hypoalbuminemia due to protein-calorie malnutrition (HCC) Postoperative pain Sleep disturbance Tobacco abuse Bilateral pulmonary infiltrates on CXR Acute hypoxemic respiratory failure (HCC)   Allergies   has No Known Allergies.  Medications    Current Outpatient Medications:  .  amLODipine (NORVASC) 2.5 MG tablet, Take 1 tablet (2.5 mg total) by mouth daily., Disp:  , Rfl:  .  clonazePAM (KLONOPIN) 0.5 MG tablet, Take 1 tablet (0.5 mg total) by mouth 2 (two) times daily as needed for anxiety. TAKE 1 TABLET BY MOUTH AT BEDTIME FOR ANXIETY, Disp: 10 tablet, Rfl: 0 .  DULoxetine (CYMBALTA) 30 MG capsule, Take 1 capsule (30 mg total) by mouth every evening., Disp: 90 capsule, Rfl: 1 .  famotidine (PEPCID) 20 MG tablet, One at bedtime (Patient taking differently: Take 20 mg by mouth at bedtime. One at bedtime), Disp: 30 tablet, Rfl: 11 .  furosemide (LASIX) 40 MG tablet, Take 1 tablet (40 mg total) by mouth daily., Disp: 10 tablet, Rfl: 0 .  lamoTRIgine (LAMICTAL) 150 MG tablet, TAKE 1 TABLET(150 MG) BY MOUTH TWICE DAILY (Patient taking differently: Take 75 mg by mouth daily. ), Disp: 180 tablet, Rfl: 0 .  linaclotide (LINZESS) 145 MCG CAPS capsule, Take 1 capsule (145 mcg total) by mouth daily before breakfast., Disp: 30 capsule, Rfl: 5 .  Melatonin 10 MG TABS, Take 20 mg by mouth at bedtime., Disp: , Rfl:  .  polyethylene glycol powder (GLYCOLAX/MIRALAX) powder, Take 17 g  by mouth daily as needed for moderate constipation. , Disp: , Rfl:  .  predniSONE  (DELTASONE) 10 MG tablet, Take 1 tablet (10 mg total) by mouth daily with breakfast., Disp: 4 tablet, Rfl: 0 .  rivaroxaban (XARELTO) 20 MG TABS tablet, Take 1 tablet (20 mg total) by mouth daily with supper., Disp: 30 tablet, Rfl: 11 .  rOPINIRole (REQUIP) 2 MG tablet, TAKE 1 AND 1/2 TABLETS(3 MG) BY MOUTH AT BEDTIME (Patient taking differently: Take 3 mg by mouth at bedtime. ), Disp: 135 tablet, Rfl: 0   Review of Systems    Positive for fatigue, malaise, shortness of breath, chest pain, anxiety, some headaches.  Denies dizziness, syncopal events, falls.    Physical Exam:   General appearance: alert, well appearing, and in no distress, oriented to person, place, and time and anxious. Chest: Diminished breath sounds to the lower middle and lower lobe.  Left-sided.  Intermittent crackles.  Right-sided upper lobe and middle lobe CTA.  No use of accessory muscles. CVS exam:  S1, S2 with holosystolic murmur auscultated.  Skin exam - normal coloration and turgor, no rashes, no suspicious skin lesions noted. Mental Status: normal mood, behavior, speech, dress, motor activity, and thought processes, anxious.   Lab [facility] Review   Reviewed labs from hospital.  Ordered labs as appropriate today.  Chest x-ray ordered today. Personal review showing clear borders, scatterred   IMPRESSION: 1. Mild patchy airspace opacity at the left lung base could reflect atelectasis or infiltrate. Previously seen diffuse bilateral airspace opacities from 11/21/2019 have largely resolved. 2. Background of chronic interstitial lung disease.  Assessment & Plan:  [name] is a 74 y.o. female    1. Shortness of breath   2. Acute on chronic respiratory failure with hypoxia (HCC)   3. Acute on chronic diastolic heart failure ([place])   4. Acute hypoxemic respiratory failure (HCC)   5. Bilateral pulmonary infiltrates on CXR   6. AKI (acute kidney injury) ([place])   7. Sepsis due to Escherichia coli with  acute renal failure and septic shock, unspecified acute renal failure type ([place])    Orders Placed This Encounter  Procedures  . DG Chest 2 View  . CMP14+EGFR  . Magnesium  . CBC with Differential   No orders of the defined types were placed in this encounter.  Meds ordered this encounter  Medications  . levofloxacin (LEVAQUIN) 750 MG tablet    Sig: Take 1 tablet (750 mg total) by mouth daily for 5 days.    Dispense:  5 tablet    Refill:  0  . ondansetron (ZOFRAN ODT) 4 MG disintegrating tablet    Sig: Take 1 tablet (4 mg total) by mouth every 8 (eight) hours as needed for nausea or vomiting.    Dispense:  20 tablet    Refill:  0  . predniSONE (DELTASONE) 20 MG tablet    Sig: 63m x 2 days, 433mx 4 days, 2032m 4 days    Dispense:  18 tablet    Refill:  0   Patient was reviewed by myself and Dr. GreCarlota Raspberryo assisted closely in treating this patient.  Will f/u after stat pulmonary appointment.  Advised not to miss appointment.  Unfortunately unable to get blood from Ms. Weber today.  She did not want a third stick so [facility] was given both addresses and directions and the plan is to get the lab work at a later date as  they are unable to go today.  Encouraged ambulance if any decompensation in breathing, or cardiac function.  She is in line with this plan as is her brother-in-law.  Glyn Ade, NP

## 2019-12-26 NOTE — Telephone Encounter (Signed)
FYI

## 2019-12-26 NOTE — Telephone Encounter (Signed)
Home health nurse called and wanted to let provider know that she will not be seeing pt today because she was a provider from our office today. Please advise.

## 2019-12-26 NOTE — Patient Instructions (Signed)
° ° ° °  If you have lab work done today you will be contacted with your lab results within the next 2 weeks.  If you have not heard from us then please contact us. The fastest way to get your results is to register for My Chart. ° ° °IF you received an x-ray today, you will receive an invoice from [facility]. Please contact [facility] at [phone] with questions or concerns regarding your invoice.  ° °IF you received labwork today, you will receive an invoice from [facility]. Please contact [facility] at [phone] with questions or concerns regarding your invoice.  ° °Our billing staff will not be able to assist you with questions regarding bills from these companies. ° °You will be contacted with the lab results as soon as they are available. The fastest way to get your results is to activate your My Chart account. Instructions are located on the last page of this paperwork. If you have not heard from us regarding the results in 2 weeks, please contact this office. °  ° ° ° °

## 2019-12-29 ENCOUNTER — Ambulatory Visit: Payer: Self-pay | Admitting: *Deleted

## 2019-12-29 ENCOUNTER — Institutional Professional Consult (permissible substitution): Payer: Medicare Other | Admitting: Pulmonary Disease

## 2019-12-29 ENCOUNTER — Other Ambulatory Visit: Payer: Self-pay | Admitting: *Deleted

## 2019-12-29 DIAGNOSIS — J9601 Acute respiratory failure with hypoxia: Secondary | ICD-10-CM | POA: Diagnosis not present

## 2019-12-29 DIAGNOSIS — J449 Chronic obstructive pulmonary disease, unspecified: Secondary | ICD-10-CM | POA: Diagnosis not present

## 2019-12-29 DIAGNOSIS — I2510 Atherosclerotic heart disease of native coronary artery without angina pectoris: Secondary | ICD-10-CM | POA: Diagnosis not present

## 2019-12-29 DIAGNOSIS — I5033 Acute on chronic diastolic (congestive) heart failure: Secondary | ICD-10-CM | POA: Diagnosis not present

## 2019-12-29 DIAGNOSIS — I110 Hypertensive heart disease with heart failure: Secondary | ICD-10-CM | POA: Diagnosis not present

## 2019-12-29 DIAGNOSIS — F319 Bipolar disorder, unspecified: Secondary | ICD-10-CM | POA: Diagnosis not present

## 2019-12-29 NOTE — Patient Outreach (Signed)
[place] [facility]) Care Management  12/29/2019  GENERRA GRADWELL 1945-09-18 [id number]   CSW attempted a third and final initial outreach call to pt today.  No voice mail offered.  CSW will plan case closure per protocol and advise [facility] team and PCP.  Please re-consult if needs/pt interest change.   Eduard Clos, MSW, [place] Worker  [place] [phone]

## 2019-12-30 LAB — ACID FAST CULTURE WITH REFLEXED SENSITIVITIES (MYCOBACTERIA): Acid Fast Culture: NEGATIVE

## 2019-12-31 ENCOUNTER — Other Ambulatory Visit: Payer: Self-pay | Admitting: *Deleted

## 2019-12-31 ENCOUNTER — Telehealth: Payer: Self-pay | Admitting: Family Medicine

## 2019-12-31 DIAGNOSIS — I110 Hypertensive heart disease with heart failure: Secondary | ICD-10-CM | POA: Diagnosis not present

## 2019-12-31 DIAGNOSIS — I5033 Acute on chronic diastolic (congestive) heart failure: Secondary | ICD-10-CM | POA: Diagnosis not present

## 2019-12-31 DIAGNOSIS — J449 Chronic obstructive pulmonary disease, unspecified: Secondary | ICD-10-CM | POA: Diagnosis not present

## 2019-12-31 DIAGNOSIS — F319 Bipolar disorder, unspecified: Secondary | ICD-10-CM | POA: Diagnosis not present

## 2019-12-31 DIAGNOSIS — J9601 Acute respiratory failure with hypoxia: Secondary | ICD-10-CM | POA: Diagnosis not present

## 2019-12-31 DIAGNOSIS — I2510 Atherosclerotic heart disease of native coronary artery without angina pectoris: Secondary | ICD-10-CM | POA: Diagnosis not present

## 2019-12-31 NOTE — Telephone Encounter (Signed)
Patient has orders in for her blood to be drawn , but she would like for orders to be sent to [facility] because its difficult to draw her blood. Please Advise.

## 2019-12-31 NOTE — Telephone Encounter (Signed)
For orders, did she mean [facility] drawing station, not [facility]?  Can print requisition for her to have drawn at [place].  Thanks. Will route to lab.

## 2019-12-31 NOTE — Telephone Encounter (Signed)
Pt has active orders to have her blood drawn from us-PCP. She would like an order placed to [facility] for her blood to be drawn. She has stated-it's very difficult for her blood to be drawn. Please advise at [phone].

## 2019-12-31 NOTE — Patient Outreach (Signed)
[place] [facility]) Care Management  12/31/2019  [name] [birth year] [id number]   Telephone Assessment-Unsuccessful  RN attempted outreach call however unsuccessful. RN able to leave a HIPAA approved voice message requesting a call back.  Plan: Will rescheduled another outreach call next week for ongoing [facility] services.  Raina Mina, RN Care Management Coordinator [place] Office [phone]

## 2020-01-01 DIAGNOSIS — I5033 Acute on chronic diastolic (congestive) heart failure: Secondary | ICD-10-CM | POA: Diagnosis not present

## 2020-01-01 DIAGNOSIS — I110 Hypertensive heart disease with heart failure: Secondary | ICD-10-CM | POA: Diagnosis not present

## 2020-01-01 DIAGNOSIS — I2510 Atherosclerotic heart disease of native coronary artery without angina pectoris: Secondary | ICD-10-CM | POA: Diagnosis not present

## 2020-01-01 DIAGNOSIS — F319 Bipolar disorder, unspecified: Secondary | ICD-10-CM | POA: Diagnosis not present

## 2020-01-01 DIAGNOSIS — J449 Chronic obstructive pulmonary disease, unspecified: Secondary | ICD-10-CM | POA: Diagnosis not present

## 2020-01-01 DIAGNOSIS — J9601 Acute respiratory failure with hypoxia: Secondary | ICD-10-CM | POA: Diagnosis not present

## 2020-01-01 NOTE — Telephone Encounter (Signed)
I called and spoke with the patient and she states that she will go to [facility] on [address] 01/02/20. The requisition has been faxed.

## 2020-01-02 ENCOUNTER — Ambulatory Visit (INDEPENDENT_AMBULATORY_CARE_PROVIDER_SITE_OTHER): Payer: Medicare Other | Admitting: Psychiatry

## 2020-01-02 ENCOUNTER — Encounter: Payer: Self-pay | Admitting: Psychiatry

## 2020-01-02 DIAGNOSIS — Z634 Disappearance and death of family member: Secondary | ICD-10-CM

## 2020-01-02 DIAGNOSIS — Z87891 Personal history of nicotine dependence: Secondary | ICD-10-CM | POA: Diagnosis not present

## 2020-01-02 DIAGNOSIS — F411 Generalized anxiety disorder: Secondary | ICD-10-CM

## 2020-01-02 DIAGNOSIS — N390 Urinary tract infection, site not specified: Secondary | ICD-10-CM

## 2020-01-02 DIAGNOSIS — Z8673 Personal history of transient ischemic attack (TIA), and cerebral infarction without residual deficits: Secondary | ICD-10-CM

## 2020-01-02 DIAGNOSIS — G3184 Mild cognitive impairment, so stated: Secondary | ICD-10-CM

## 2020-01-02 DIAGNOSIS — F331 Major depressive disorder, recurrent, moderate: Secondary | ICD-10-CM

## 2020-01-02 DIAGNOSIS — J9621 Acute and chronic respiratory failure with hypoxia: Secondary | ICD-10-CM | POA: Diagnosis not present

## 2020-01-02 DIAGNOSIS — G2581 Restless legs syndrome: Secondary | ICD-10-CM

## 2020-01-02 DIAGNOSIS — R0602 Shortness of breath: Secondary | ICD-10-CM | POA: Diagnosis not present

## 2020-01-02 NOTE — Progress Notes (Signed)
[name] [id number] 06-Apr-1946 74 y.o.   Virtual Visit via WebEx  I connected with pt by audio and visual WebEx app and verified that I am speaking with the correct person using two identifiers.   I discussed the limitations, risks, security and privacy concerns of performing an evaluation and management service by t WebEx and the availability of in person appointments. I also discussed with the patient that there may be a patient responsible charge related to this service. The patient expressed understanding and agreed to proceed.  I discussed the assessment and treatment plan with the patient. The patient was provided an opportunity to ask questions and all were answered. The patient agreed with the plan and demonstrated an understanding of the instructions.   The patient was advised to call back or seek an in-person evaluation if the symptoms worsen or if the condition fails to improve as anticipated.  I provided 30 minutes of non-face-to-face time during this encounter. The call started at 1000 and ended at 1030o'clock. The patient was located at home and the provider was located office.   Subjective:   Patient ID:  [name] is a 74 y.o. (DOB Jan 27, 1946) female.  Chief Complaint:  Chief Complaint  Patient presents with  . Follow-up  . Depression  . Anxiety  . Sleeping Problem    Depression        Associated symptoms include decreased concentration and fatigue.  Associated symptoms include no suicidal ideas.  Past medical history includes anxiety.   Anxiety Symptoms include decreased concentration, dizziness, nausea, nervous/anxious behavior and shortness of breath. Patient reports no confusion or suicidal ideas.     [name] presents to the office today for follow-up of depression and anxiety and MCI.    at visit January 09, 2019.  She was having problems with restless legs and ropinirole was increased to 3 mg every afternoon.  There were no other med  changes.  visit Feb 05, 2019.  She was having mixture of depression and anxiety and chronic insomnia with delayed sleep phase.  For the reasons mentioned at the last visit she was started on risperidone 0.25 mg nightly.  She was maintained on duloxetine 90 mg, clonazepam 1 mg nightly, lamotrigine 150 twice daily, and ropinirole 2 mg 1-1/2 tablets nightly for restless legs.  visit September 2020.  She had stopped the risperidone because she only took it a few days.  She felt nausea and spaced out on it.  No other meds were changed.  Last visit December 2020.  The following was noted: Doing very poorly.  Not getting dressed and not out of the house for 5 days.  Hard to walk with pain.  Falling apart overnight.  Feet hurt at night.  Worries over poor circulation to toes.  Placed on blood thinners.  Continued health problems with COPD.  Energy is low and hard time walking.  Arthritis in back.  House on the market and that's stressful.  Sx largely unchanged but "it's circumstances".  Planning to move to [facility] but ambivalent.  Brother in law lives next door and wants her to stay here.  Also stressed over politics and social things.   Had period of crippling nausea and cut all meds back in 1/2.  Thinks it's mental issue.  Nausea is some better with dose reduction but is more depressed.  Plan: Use pill box to improve compliance. DT nausea  split meds but increase duloxetine 30 mg BID and lamotrigine 75 mg  BID. She's been on lower dose DT nausea but is more depressed.  January 02, 2020 appointment, the following is noted: She got septic with a UTI and then was admitted to [name] for re[facility]. Home now.  Getting stronger.  Having to take nausea meds. Also diarrhea.  Would like to be on higher dose of meds but can't tolerate it.  Still on some prednisone.  Pending cardiology with CHF.  Also pulmonology. Sleep is pretty good with meds.    RLS managed.  Sleep pretty good, except irregular hours.Pt reports  that mood is Depressed worse at night and describes anxiety as worse and moderate in the same dose for depression.  She is also having problems with irritability. Anxiety symptoms include: Excessive Worry, Panic Symptoms,. Pt reports sleep is more regular and consistent with melatonin 10 mg nightly.. Pt reports that appetite is decreased. Pt reports that energy is poor and loss of interest or pleasure in usual activities, poor motivation and withdrawn from usual activities. Concentration is scattered. Crying spells.  Guilt feelings over relations with [name].  Suicidal thoughts:  denied by patient. B In Law next door.    Increased ropinirole helped RLS.  Her sister had bipolar disorder.  Past Psychiatric Medication Trials: Lithium 300 insomnia, Depakote, olanzapine 2.5,  lamotrigine 300, duloxetine 90, ropinirole as high as 2 mg 3 times daily for restless legs, Aricept poorly tolerated, Abilify side effects, fluoxetine, sertraline, paroxetine, Lexapro Donepezil SE Long psychiatric history.  Sister bipolar. I have never prescribed lorazepam nor Xanax  Review of Systems:  Review of Systems  Constitutional: Positive for fatigue.  Respiratory: Positive for shortness of breath.   Cardiovascular:       Claudication   Gastrointestinal: Positive for nausea.  Neurological: Positive for dizziness and weakness. Negative for tremors.  Psychiatric/Behavioral: Positive for decreased concentration, depression, dysphoric mood and sleep disturbance. Negative for agitation, behavioral problems, confusion, hallucinations, self-injury and suicidal ideas. The patient is nervous/anxious. The patient is not hyperactive.    LEG surgery for claudication Jan 3  Medications: I have reviewed the patient's current medications.  Current Outpatient Medications  Medication Sig Dispense Refill  . amLODipine (NORVASC) 2.5 MG tablet Take 1 tablet (2.5 mg total) by mouth daily.    . clonazePAM (KLONOPIN) 0.5 MG tablet  Take 1 tablet (0.5 mg total) by mouth 2 (two) times daily as needed for anxiety. TAKE 1 TABLET BY MOUTH AT BEDTIME FOR ANXIETY 10 tablet 0  . DULoxetine (CYMBALTA) 30 MG capsule Take 1 capsule (30 mg total) by mouth every evening. (Patient taking differently: Take 30 mg by mouth 2 (two) times daily. ) 90 capsule 1  . famotidine (PEPCID) 20 MG tablet One at bedtime (Patient taking differently: Take 20 mg by mouth at bedtime. One at bedtime) 30 tablet 11  . furosemide (LASIX) 40 MG tablet Take 1 tablet (40 mg total) by mouth daily. 10 tablet 0  . lamoTRIgine (LAMICTAL) 150 MG tablet TAKE 1 TABLET(150 MG) BY MOUTH TWICE DAILY (Patient taking differently: Take 75 mg by mouth 2 (two) times daily. ) 180 tablet 0  . linaclotide (LINZESS) 145 MCG CAPS capsule Take 1 capsule (145 mcg total) by mouth daily before breakfast. 30 capsule 5  . Melatonin 10 MG TABS Take 20 mg by mouth at bedtime.    . ondansetron (ZOFRAN ODT) 4 MG disintegrating tablet Take 1 tablet (4 mg total) by mouth every 8 (eight) hours as needed for nausea or vomiting. 20 tablet 0  . polyethylene  glycol powder (GLYCOLAX/MIRALAX) powder Take 17 g by mouth daily as needed for moderate constipation.     . predniSONE (DELTASONE) 20 MG tablet 60mg  x 2 days, 40mg  x 4 days, 20mg  x 4 days 18 tablet 0  . rivaroxaban (XARELTO) 20 MG TABS tablet Take 1 tablet (20 mg total) by mouth daily with supper. 30 tablet 11  . rOPINIRole (REQUIP) 2 MG tablet TAKE 1 AND 1/2 TABLETS(3 MG) BY MOUTH AT BEDTIME (Patient taking differently: Take 3 mg by mouth at bedtime. ) 135 tablet 0   No current facility-administered medications for this visit.    Medication Side Effects: None unless nausea  Allergies: No Known Allergies  Past Medical History:  Diagnosis Date  . AAA (abdominal aortic aneurysm) (HCC)    3.1 cm 07/08/18, 3 year follow-up recommended; possible 3 cm AAA by aortogram 09/13/18  . Anemia    PMH  . Appendicitis with abscess    07/08/18, s/p perc  drain; resolved 07/30/18 by CT  . Arthritis   . Bipolar disorder ([place])   . Cerebrovascular disease    intra and extracranial vascular dx per MRI 4/11, neurology rec strict CVRF control  . Colonic inertia   . Constipation    chronic;severe  . Coronary artery disease   . Depression   . Duodenitis   . EKG abnormalities    changes, stress test neg (false EKG changes)  . Gastritis   . GERD (gastroesophageal reflux disease)   . Hypertension   . Hypothyroid 01/16/2014  . Meningioma ([place]) 10/21/2013  . PAD (peripheral artery disease) ([place])   . Psoriasis    sees derm  . Stroke ([place])   . Wears dentures   . Wears glasses     Family History  Problem Relation Age of Onset  . Breast cancer Mother        metastisis to bones  . Diabetes Son   . Heart disease Other        grandfather   . Alcohol abuse Brother   . Heart disease Brother   . Heart disease Maternal Aunt   . Lung cancer Brother        smoked  . Colon cancer Neg Hx     Social History   Socioeconomic History  . Marital status: Widowed    Spouse name: Not on file  . Number of children: 1  . Years of education: Not on file  . Highest education level: Not on file  Occupational History  . Occupation: retired  Tobacco Use  . Smoking status: Former Smoker    Packs/day: 0.75    Years: 60.00    Pack years: 45.00    Types: Cigarettes  . Smokeless tobacco: Never Used  Substance and Sexual Activity  . Alcohol use: Yes    Alcohol/week: 0.0 standard drinks    Comment: yes on occassion  . Drug use: No  . Sexual activity: Not Currently  Other Topics Concern  . Not on file  Social History Narrative   Brother in law Mr Rexford Maus (one of my patients)   Lives w/ husband       Social Determinants of Health   Financial Resource Strain:   . Difficulty of Paying Living Expenses:   Food Insecurity:   . Worried About Charity fundraiser in the Last Year:   . Arboriculturist in the Last Year:   Transportation Needs:   .  Film/video editor (Medical):   Marland Kitchen Lack of  Transportation (Non-Medical):   Physical Activity:   . Days of Exercise per Week:   . Minutes of Exercise per Session:   Stress:   . Feeling of Stress :   Social Connections:   . Frequency of Communication with Friends and Family:   . Frequency of Social Gatherings with Friends and Family:   . Attends Religious Services:   . Active Member of Clubs or Organizations:   . Attends Archivist Meetings:   Marland Kitchen Marital Status:   Intimate Partner Violence:   . Fear of Current or Ex-Partner:   . Emotionally Abused:   Marland Kitchen Physically Abused:   . Sexually Abused:     Past Medical History, Surgical history, Social history, and Family history were reviewed and updated as appropriate.   Please see review of systems for further details on the patient's review from today.   Objective:   Physical Exam:  There were no vitals taken for this visit.  Physical Exam Neurological:     Mental Status: She is alert and oriented to person, place, and time.     Cranial Nerves: No dysarthria.  Psychiatric:        Attention and Perception: She is inattentive. She does not perceive auditory hallucinations.        Mood and Affect: Mood is anxious and depressed.        Speech: Speech normal. Speech is not rapid and pressured or slurred.        Behavior: Behavior is not slowed or aggressive. Behavior is cooperative.        Thought Content: Thought content normal. Thought content is not paranoid or delusional. Thought content does not include homicidal or suicidal ideation. Thought content does not include homicidal or suicidal plan.        Cognition and Memory: Memory is impaired.     Comments: She is somewhat chronically scattered and easily confused and that is continuing but partly related to grief since [name] died.  Still depressed, anxious and less irritable.  Insight and judgment are fair     Lab Review:     Component Value Date/Time   NA 134 (L)  11/26/2019 0650   NA 128 (L) 11/10/2019 1514   K 4.3 11/26/2019 0650   CL 91 (L) 11/26/2019 0650   CO2 29 11/26/2019 0650   GLUCOSE 120 (H) 11/26/2019 0650   BUN 42 (H) 11/26/2019 0650   BUN 26 11/10/2019 1514   CREATININE 1.37 (H) 11/26/2019 0650   CREATININE 0.77 07/28/2015 1805   CALCIUM 8.4 (L) 11/26/2019 0650   PROT 6.5 11/23/2019 0536   PROT 6.5 11/10/2019 1514   ALBUMIN 2.4 (L) 11/23/2019 0536   ALBUMIN 3.7 11/10/2019 1514   AST 17 11/23/2019 0536   ALT 30 11/23/2019 0536   ALKPHOS 126 11/23/2019 0536   BILITOT 0.8 11/23/2019 0536   BILITOT 0.5 11/10/2019 1514   GFRNONAA 37 (L) 11/26/2019 0650   GFRAA 43 (L) 11/26/2019 0650       Component Value Date/Time   WBC 12.5 (H) 11/26/2019 0650   RBC 3.79 (L) 11/26/2019 0650   HGB 11.2 (L) 11/26/2019 0650   HGB 12.6 04/04/2019 1536   HCT 34.5 (L) 11/26/2019 0650   HCT 37.8 04/04/2019 1536   PLT 433 (H) 11/26/2019 0650   PLT 257 04/04/2019 1536   MCV 91.0 11/26/2019 0650   MCV 89.8 11/10/2019 1512   MCV 87 04/04/2019 1536   MCH 29.6 11/26/2019 0650   MCHC 32.5 11/26/2019 0650  RDW 15.1 11/26/2019 0650   RDW 14.6 04/04/2019 1536   LYMPHSABS 1.2 11/23/2019 1957   MONOABS 0.4 11/23/2019 1957   EOSABS 0.0 11/23/2019 1957   BASOSABS 0.0 11/23/2019 1957    No results found for: POCLITH, LITHIUM   No results found for: PHENYTOIN, PHENOBARB, VALPROATE, CBMZ   .res Assessment: Plan:    Major depressive disorder, recurrent episode, moderate (HCC)  Generalized anxiety disorder  Mild cognitive impairment  Bereavement  Restless legs syndrome  Kinzlie has some chronic depression and chronic irregular sleep patterns which have been resistant to treatment.  Her chronic depression and anxiety are complicated now by double grief of loss of her sister and now her husband.  She is lonely and mood affected by recent severe mood problems.  Cannot tolerate more meds right now.   Her insomnia and delayed sleep phase are chronic  and somewhat the result of poor sleep hygiene.  We discussed sleep hygiene in detail.  Sleep hygiene issues discussed although this is been very resistant to change.  We need to make sure she is not drinking too much caffeine especially late in the day.  Cont counseling for grief, depression, anxiety, and health crises.   I still suspect this could be some variant of bipolar disorder but that is not certain.    She stopped the donepezil bc GI upset.  Consider Exelon patch for cognition.  But defer this because she is not in a condition to start a new medication at this time.  Continue ropinirole 3 mg for RLS.  This is better controlled her restless legs.  Disc SE incl hallucinations and dizziness.   This high dosage appears medically necessary.  Call if this fails and will consider doing something with the clonazepam either increasing it or perhaps switching to another benzodiazepine which might have less cognitive problems.  Such as lorazepam  Emphasized importance and proper nutrition and sleep and protein.  Doesn't eat much meat bc constipation but will eat fish.  Discussed how protein is necessary for the proper response to depression medicines  FU 2  mos.  Lynder Parents, MD, DFAPA  Please see After Visit Summary for patient specific instructions.  Future Appointments  Date Time Provider [place]  01/02/2020  2:00 PM Blanchie Serve, PhD CP-CP None  01/08/2020  1:00 PM Tobi Bastos, RN THN-COM None  01/16/2020  2:00 PM Laurin Coder, MD LBPU-PULCARE None  01/23/2020  3:40 PM [name], MD PCP-PCP P[facility]    No orders of the defined types were placed in this encounter.     -------------------------------

## 2020-01-02 NOTE — Progress Notes (Signed)
Psychotherapy Progress Note [facility] Luan Moore, PhD LP  Patient ID: [name]     MRN: [id number] Therapy format: Individual psychotherapy Date: 01/02/2020      Start: 2:10p     Stop: 2:55p     Time Spent: 45 min Location: In-person   Session narrative (presenting needs, interim history, self-report of stressors and symptoms, applications of prior therapy, status changes, and interventions made in session) Home for a week and a half now.  Energized and more active support right away, quieter now.  Misses [name] and Suzie, aware she feels more need of help these days with her health as it is.  Worries if the UTI might come back, but positively impressed with her physicians right now, including her new pulmonologist.  In extended recovery from having been septic and intubated.  Has occupational therapy, doesn't know why.  Dealing with slowness to recuperate compared to how she did after vascular surgery.  Home health 4-6 hr/d.  Has a spirometer.  Oriented to OT and possible respiratory therapy.  Confined to first floor of her house at this point, able to make 6 steps.    Has been sloughing off some medicine, suspects it's psychosomatic to get nauseous.  Faithful to Lamictal, Klonopin, Cymbalta, and Requip -- the others cause her nausea.  Has Zofran.  Notes IBS, preexisting her sepsis.  Has cut Cymbalta and Lamictal in half, actually.  Does not remember Dr. Casimiro Needle advice to ensure protein, but she has been getting a lot of eggs lately.  Hx of Linzess, but it started to race her bowels.  Best could figure, had taken it after coming off an opioid, in which case Linzess would be inappropriate anyway.  Advised fiber supplement to help moderate her bowel function, though she believes it has constipated her before.  Educated on the moderating action of fiber supplements and encouraged to try again.  Did quit smoking!  5 weeks clean now, having gone through withdrawal during  her rehab stay.  Does get cravings, but just goes outside with coffee instead of smokes.  Applauded the move and reframed cravings as momentary evidence of her brain making its adjustment, her chance to witness her brain getting more fully free of the grip f nicotine.  Relationship with [name] going considerably better as well.  Plan remains to stay in [facility] for now, continue home help as needed.  Therapeutic modalities: Cognitive Behavioral Therapy, Solution-Oriented/Positive Psychology and Psycho-education/Bibliotherapy  Mental Status/Observations:  Appearance:   Not assessed     Behavior:  Appropriate  Motor:  Not assessed  Speech/Language:   clear  Affect:  Not assessed  Mood:  less depressed  Thought process:  normal  Thought content:    WNL  Sensory/Perceptual disturbances:    WNL  Orientation:  Fully oriented  Attention:  Fair    Concentration:  Good  Memory:  grossly intact  Insight:    Good  Judgment:   Fair  Impulse Control:  Good   Risk Assessment: Danger to Self: No Self-injurious Behavior: No Danger to Others: No Physical Aggression / Violence: No Duty to Warn: No Access to Firearms a concern: No  Assessment of progress:  stabilized  Diagnosis:   ICD-10-CM   1. Major depressive disorder, recurrent episode, moderate (HCC)  F33.1   2. Mild cognitive impairment  G31.84   3. Bereavement  Z63.4   4. Complicated UTI (urinary tract infection) - resolved  N39.0   5. History of CVA (cerebrovascular  accident)  Z86.73   6. Generalized anxiety disorder  F41.1    Plan:  . Advise catch up with gastroenterologist, try fiber to moderate bowels . Option to ask for respiratory therapy if she wants  . Other recommendations/advice as may be noted above . Continue to utilize previously learned skills ad lib . Maintain medication as prescribed and work faithfully with relevant prescriber(s) if any changes are desired or seem indicated . Call the clinic on-call service,  present to ER, or call 911 if any life-threatening psychiatric crisis Return for time as available. . Already scheduled visit in this office Visit date not found.  Blanchie Serve, PhD Luan Moore, PhD LP Clinical Psychologist, [facility] [address] [place], [place] [address] [phone]

## 2020-01-03 LAB — CMP14+EGFR
ALT: 12 IU/L (ref 0–32)
AST: 15 IU/L (ref 0–40)
Albumin/Globulin Ratio: 1.1 — ABNORMAL LOW (ref 1.2–2.2)
Albumin: 4.2 g/dL (ref 3.7–4.7)
Alkaline Phosphatase: 87 IU/L (ref 39–117)
BUN/Creatinine Ratio: 16 (ref 12–28)
BUN: 17 mg/dL (ref 8–27)
Bilirubin Total: 0.2 mg/dL (ref 0.0–1.2)
CO2: 25 mmol/L (ref 20–29)
Calcium: 9.9 mg/dL (ref 8.7–10.3)
Chloride: 98 mmol/L (ref 96–106)
Creatinine, Ser: 1.08 mg/dL — ABNORMAL HIGH (ref 0.57–1.00)
GFR calc Af Amer: 58 mL/min/{1.73_m2} — ABNORMAL LOW (ref >59)
GFR calc non Af Amer: 50 mL/min/{1.73_m2} — ABNORMAL LOW (ref >59)
Globulin, Total: 3.7 g/dL (ref 1.5–4.5)
Glucose: 96 mg/dL (ref 65–99)
Potassium: 4.5 mmol/L (ref 3.5–5.2)
Sodium: 135 mmol/L (ref 134–144)
Total Protein: 7.9 g/dL (ref 6.0–8.5)

## 2020-01-03 LAB — CBC WITH DIFFERENTIAL/PLATELET
Basophils Absolute: 0 10*3/uL (ref 0.0–0.2)
Basos: 1 %
EOS (ABSOLUTE): 0.1 10*3/uL (ref 0.0–0.4)
Eos: 1 %
Hematocrit: 36.4 % (ref 34.0–46.6)
Hemoglobin: 11.9 g/dL (ref 11.1–15.9)
Immature Grans (Abs): 0 10*3/uL (ref 0.0–0.1)
Immature Granulocytes: 0 %
Lymphocytes Absolute: 2.7 10*3/uL (ref 0.7–3.1)
Lymphs: 31 %
MCH: 29.5 pg (ref 26.6–33.0)
MCHC: 32.7 g/dL (ref 31.5–35.7)
MCV: 90 fL (ref 79–97)
Monocytes Absolute: 1 10*3/uL — ABNORMAL HIGH (ref 0.1–0.9)
Monocytes: 12 %
Neutrophils Absolute: 4.7 10*3/uL (ref 1.4–7.0)
Neutrophils: 55 %
Platelets: 370 10*3/uL (ref 150–450)
RBC: 4.03 x10E6/uL (ref 3.77–5.28)
RDW: 14 % (ref 11.7–15.4)
WBC: 8.5 10*3/uL (ref 3.4–10.8)

## 2020-01-03 LAB — MAGNESIUM: Magnesium: 1.7 mg/dL (ref 1.6–2.3)

## 2020-01-05 DIAGNOSIS — I2510 Atherosclerotic heart disease of native coronary artery without angina pectoris: Secondary | ICD-10-CM | POA: Diagnosis not present

## 2020-01-05 DIAGNOSIS — J9601 Acute respiratory failure with hypoxia: Secondary | ICD-10-CM | POA: Diagnosis not present

## 2020-01-05 DIAGNOSIS — I5033 Acute on chronic diastolic (congestive) heart failure: Secondary | ICD-10-CM | POA: Diagnosis not present

## 2020-01-05 DIAGNOSIS — J449 Chronic obstructive pulmonary disease, unspecified: Secondary | ICD-10-CM | POA: Diagnosis not present

## 2020-01-05 DIAGNOSIS — I110 Hypertensive heart disease with heart failure: Secondary | ICD-10-CM | POA: Diagnosis not present

## 2020-01-05 DIAGNOSIS — F319 Bipolar disorder, unspecified: Secondary | ICD-10-CM | POA: Diagnosis not present

## 2020-01-07 ENCOUNTER — Encounter: Payer: Self-pay | Admitting: Family Medicine

## 2020-01-08 ENCOUNTER — Other Ambulatory Visit: Payer: Self-pay | Admitting: *Deleted

## 2020-01-08 DIAGNOSIS — I110 Hypertensive heart disease with heart failure: Secondary | ICD-10-CM | POA: Diagnosis not present

## 2020-01-08 DIAGNOSIS — F319 Bipolar disorder, unspecified: Secondary | ICD-10-CM | POA: Diagnosis not present

## 2020-01-08 DIAGNOSIS — I2510 Atherosclerotic heart disease of native coronary artery without angina pectoris: Secondary | ICD-10-CM | POA: Diagnosis not present

## 2020-01-08 DIAGNOSIS — J449 Chronic obstructive pulmonary disease, unspecified: Secondary | ICD-10-CM | POA: Diagnosis not present

## 2020-01-08 DIAGNOSIS — J9601 Acute respiratory failure with hypoxia: Secondary | ICD-10-CM | POA: Diagnosis not present

## 2020-01-08 DIAGNOSIS — I5033 Acute on chronic diastolic (congestive) heart failure: Secondary | ICD-10-CM | POA: Diagnosis not present

## 2020-01-08 NOTE — Patient Outreach (Signed)
[place] [facility]) Care Management  01/08/2020  [name] [birth year] [id number]  Telephone Assessment-Unsuccessful  RN attempted outreach call today however unsuccessful. Rn able to leave a HIPAA approved voice message requesting a call back.  Plan: Will send outreach letter and scheduled another outreach call over the next week.   Raina Mina, RN Care Management Coordinator [place] Office [phone]

## 2020-01-12 DIAGNOSIS — I5033 Acute on chronic diastolic (congestive) heart failure: Secondary | ICD-10-CM | POA: Diagnosis not present

## 2020-01-12 DIAGNOSIS — F319 Bipolar disorder, unspecified: Secondary | ICD-10-CM | POA: Diagnosis not present

## 2020-01-12 DIAGNOSIS — I110 Hypertensive heart disease with heart failure: Secondary | ICD-10-CM | POA: Diagnosis not present

## 2020-01-12 DIAGNOSIS — J9601 Acute respiratory failure with hypoxia: Secondary | ICD-10-CM | POA: Diagnosis not present

## 2020-01-12 DIAGNOSIS — J449 Chronic obstructive pulmonary disease, unspecified: Secondary | ICD-10-CM | POA: Diagnosis not present

## 2020-01-12 DIAGNOSIS — I2510 Atherosclerotic heart disease of native coronary artery without angina pectoris: Secondary | ICD-10-CM | POA: Diagnosis not present

## 2020-01-14 ENCOUNTER — Other Ambulatory Visit: Payer: Self-pay | Admitting: *Deleted

## 2020-01-14 NOTE — Patient Outreach (Signed)
[place] [facility]) Care Management  01/14/2020  [name] 1946/09/10 [id number]   Telephone Assessment-Case closure   RN attempted 3rd outreach call however remains unsuccessful. RN able to leave a HIPAA approved voice message requesting a call back.  No response to the outreach letter send earlier in April or earlier messages left.   Plan: Will notify the provider of pt's disposition with [facility] services at this time and close this case due to unable to maintain contact.  Raina Mina, RN Care Management Coordinator [place] Office [phone]

## 2020-01-15 DIAGNOSIS — J9601 Acute respiratory failure with hypoxia: Secondary | ICD-10-CM | POA: Diagnosis not present

## 2020-01-15 DIAGNOSIS — I5033 Acute on chronic diastolic (congestive) heart failure: Secondary | ICD-10-CM | POA: Diagnosis not present

## 2020-01-15 DIAGNOSIS — I2510 Atherosclerotic heart disease of native coronary artery without angina pectoris: Secondary | ICD-10-CM | POA: Diagnosis not present

## 2020-01-15 DIAGNOSIS — I110 Hypertensive heart disease with heart failure: Secondary | ICD-10-CM | POA: Diagnosis not present

## 2020-01-15 DIAGNOSIS — F319 Bipolar disorder, unspecified: Secondary | ICD-10-CM | POA: Diagnosis not present

## 2020-01-15 DIAGNOSIS — J449 Chronic obstructive pulmonary disease, unspecified: Secondary | ICD-10-CM | POA: Diagnosis not present

## 2020-01-16 ENCOUNTER — Institutional Professional Consult (permissible substitution): Payer: Medicare Other | Admitting: Pulmonary Disease

## 2020-01-16 DIAGNOSIS — F17210 Nicotine dependence, cigarettes, uncomplicated: Secondary | ICD-10-CM | POA: Diagnosis not present

## 2020-01-16 DIAGNOSIS — J9601 Acute respiratory failure with hypoxia: Secondary | ICD-10-CM | POA: Diagnosis not present

## 2020-01-16 DIAGNOSIS — J84112 Idiopathic pulmonary fibrosis: Secondary | ICD-10-CM | POA: Diagnosis not present

## 2020-01-16 DIAGNOSIS — R42 Dizziness and giddiness: Secondary | ICD-10-CM | POA: Diagnosis not present

## 2020-01-16 DIAGNOSIS — E039 Hypothyroidism, unspecified: Secondary | ICD-10-CM | POA: Diagnosis not present

## 2020-01-16 DIAGNOSIS — F319 Bipolar disorder, unspecified: Secondary | ICD-10-CM | POA: Diagnosis not present

## 2020-01-16 DIAGNOSIS — M545 Low back pain: Secondary | ICD-10-CM | POA: Diagnosis not present

## 2020-01-16 DIAGNOSIS — Z87440 Personal history of urinary (tract) infections: Secondary | ICD-10-CM | POA: Diagnosis not present

## 2020-01-16 DIAGNOSIS — I2510 Atherosclerotic heart disease of native coronary artery without angina pectoris: Secondary | ICD-10-CM | POA: Diagnosis not present

## 2020-01-16 DIAGNOSIS — R001 Bradycardia, unspecified: Secondary | ICD-10-CM | POA: Diagnosis not present

## 2020-01-16 DIAGNOSIS — I739 Peripheral vascular disease, unspecified: Secondary | ICD-10-CM | POA: Diagnosis not present

## 2020-01-16 DIAGNOSIS — E440 Moderate protein-calorie malnutrition: Secondary | ICD-10-CM | POA: Diagnosis not present

## 2020-01-16 DIAGNOSIS — R5381 Other malaise: Secondary | ICD-10-CM | POA: Diagnosis not present

## 2020-01-16 DIAGNOSIS — I10 Essential (primary) hypertension: Secondary | ICD-10-CM | POA: Diagnosis not present

## 2020-01-16 DIAGNOSIS — Z85820 Personal history of malignant melanoma of skin: Secondary | ICD-10-CM | POA: Diagnosis not present

## 2020-01-16 DIAGNOSIS — L409 Psoriasis, unspecified: Secondary | ICD-10-CM | POA: Diagnosis not present

## 2020-01-16 DIAGNOSIS — K219 Gastro-esophageal reflux disease without esophagitis: Secondary | ICD-10-CM | POA: Diagnosis not present

## 2020-01-16 DIAGNOSIS — Z7952 Long term (current) use of systemic steroids: Secondary | ICD-10-CM | POA: Diagnosis not present

## 2020-01-16 DIAGNOSIS — M1990 Unspecified osteoarthritis, unspecified site: Secondary | ICD-10-CM | POA: Diagnosis not present

## 2020-01-16 DIAGNOSIS — I5033 Acute on chronic diastolic (congestive) heart failure: Secondary | ICD-10-CM | POA: Diagnosis not present

## 2020-01-16 DIAGNOSIS — I110 Hypertensive heart disease with heart failure: Secondary | ICD-10-CM | POA: Diagnosis not present

## 2020-01-16 DIAGNOSIS — G2581 Restless legs syndrome: Secondary | ICD-10-CM | POA: Diagnosis not present

## 2020-01-16 DIAGNOSIS — K581 Irritable bowel syndrome with constipation: Secondary | ICD-10-CM | POA: Diagnosis not present

## 2020-01-16 DIAGNOSIS — J449 Chronic obstructive pulmonary disease, unspecified: Secondary | ICD-10-CM | POA: Diagnosis not present

## 2020-01-16 DIAGNOSIS — Z8673 Personal history of transient ischemic attack (TIA), and cerebral infarction without residual deficits: Secondary | ICD-10-CM | POA: Diagnosis not present

## 2020-01-16 DIAGNOSIS — K2990 Gastroduodenitis, unspecified, without bleeding: Secondary | ICD-10-CM | POA: Diagnosis not present

## 2020-01-16 DIAGNOSIS — E876 Hypokalemia: Secondary | ICD-10-CM | POA: Diagnosis not present

## 2020-01-16 DIAGNOSIS — Z7901 Long term (current) use of anticoagulants: Secondary | ICD-10-CM | POA: Diagnosis not present

## 2020-01-19 ENCOUNTER — Telehealth: Payer: Self-pay

## 2020-01-19 NOTE — Telephone Encounter (Signed)
Mutual friend of pt whom remains unknown called on pt behalf to let pcp know  That this pt is not taking her bp meds. Has had high reading and vertigo. Called pt to follow up, na. Sending to scheduling pool for pt appt

## 2020-01-20 NOTE — Telephone Encounter (Signed)
Pt has appt on 5/14 with Carlota Raspberry

## 2020-01-23 ENCOUNTER — Other Ambulatory Visit: Payer: Self-pay

## 2020-01-23 ENCOUNTER — Ambulatory Visit (INDEPENDENT_AMBULATORY_CARE_PROVIDER_SITE_OTHER): Payer: Medicare Other | Admitting: Family Medicine

## 2020-01-23 VITALS — BP 174/84 | HR 73 | Temp 97.8°F | Resp 14 | Ht 68.0 in | Wt 162.2 lb

## 2020-01-23 DIAGNOSIS — I739 Peripheral vascular disease, unspecified: Secondary | ICD-10-CM

## 2020-01-23 DIAGNOSIS — J84112 Idiopathic pulmonary fibrosis: Secondary | ICD-10-CM

## 2020-01-23 DIAGNOSIS — R197 Diarrhea, unspecified: Secondary | ICD-10-CM | POA: Diagnosis not present

## 2020-01-23 DIAGNOSIS — I10 Essential (primary) hypertension: Secondary | ICD-10-CM | POA: Diagnosis not present

## 2020-01-23 DIAGNOSIS — R110 Nausea: Secondary | ICD-10-CM | POA: Diagnosis not present

## 2020-01-23 DIAGNOSIS — J9621 Acute and chronic respiratory failure with hypoxia: Secondary | ICD-10-CM | POA: Diagnosis not present

## 2020-01-23 NOTE — Progress Notes (Signed)
Subjective:  Patient ID: [name], female    DOB: 12-08-1945  Age: 74 y.o. MRN: [id number]  CC:  Chief Complaint  Patient presents with  . septic shock    pt was in hospital for septic from UTI, pt struggling with nausea, believes this is from diarrhea that shes been having, which is preventing her from taking her meds     HPI [name] presents for   Follow-up of septic shock, acute on chronic respiratory failure with hypoxia, acute kidney injury supposed on chronic kidney disease.  Admitted March 1-18, then skilled nursing facility.  Hospital follow-up visit on April 15 with me over telemedicine  seen by Jens Som April 16 in office. Symptomatically improving but increased cough.  Chest x-ray with mild patchy airspace opacity left lung base atelectasis versus infiltrate.  Previously seen diffuse bilateral airspace opacities from March 12 were largely resolved.  Background of chronic interstitial lung disease(history of idiopathic pulmonary fibrosis) Treated with Levaquin 750 mg for 5 days, prednisone 60 mg x 2 days, 40 mg x 4 days, 20 mg x 4-day taper.  Difficulty with obtaining labs in office, plan for drawing station.  CBC reassuring on April 23, Normal magnesium, creatinine 1.08 with GFR 50, remainder of CMP reassuring.  She was referred to cardiology on April 16 for acute on chronic diastolic heart failure.  Appears multiple phone messages have been left on her voicemail to schedule appointment, has not yet seen cardiology.  Previously treated with Lasix 40 mg daily for CHF.  She suffers from chronic nausea, history of diarrhea with IBS, treated with Linzess previously.Marland Kitchen  Has been followed by gastroenterology previously.  She has had to decrease her Cymbalta and Lamictal in half due to nausea previously.  Have discussed with her multiple times in the past need for gastroenterology follow-up for her nausea.  Last visit with Tye Savoy in July 2020.   Zofran 4 mg ordered  April 16.  breathing overall better, use oxygen few days per week for 17min, used 2L yesterday. appt with pulmonary in June.  No fevers. Some good days, more activity tolerated. Still limited with long distances. Back in her bedroom now.  Feels better there.   Nausea - feels different, no regular vomiting - episode 2 weeks ago. Still feels sick on meds "all meds".  zofran not working, not taking. Diarrhea - 3-4 times per day, sometimes less. Same for over a year. No abdominal pain.  Nausea better with V8 juice and lemon juice. Has not seen GI since last year. does not want to have endoscopy/colonoscopy.   Forgot amlodipine today, usually takes daily.  Off prednisone and furosemide -  No increased swelling. Weight has remained stable at home. No chest pains.   Follow up with vascular specialist is planned - she plans on scheduling appointment.  Chronic PVD without new symptoms at this time.  Off tobacco since March.  History Patient Active Problem List   Diagnosis Date Noted  . Acute on chronic respiratory failure with hypoxia ([place]) 12/26/2019  . Acute on chronic diastolic heart failure ([place]) 12/26/2019  . Shortness of breath 12/26/2019  . Bilateral pulmonary infiltrates on CXR   . Acute hypoxemic respiratory failure ([place])   . Complicated UTI (urinary tract infection) 11/11/2019  . AKI (acute kidney injury) ([place]) 11/11/2019  . Hyponatremia 11/11/2019  . Elevated LFTs 11/11/2019  . Sepsis ([place]) 11/11/2019  . Wound infection 11/07/2018  . Postoperative pain   . Sleep disturbance   .  Tobacco abuse   . Acute blood loss anemia   . Hypoalbuminemia due to protein-calorie malnutrition ([place])   . Debility 10/24/2018  . Pre-operative cardiovascular examination 09/26/2018  . HLD (hyperlipidemia) 09/26/2018  . GAD (generalized anxiety disorder) 08/07/2018  . Major depressive disorder, single episode 08/07/2018  . Appendiceal abscess 07/08/2018  . PVD (peripheral vascular disease) ([place])  03/15/2018  . Atherosclerosis of native artery of left lower extremity with intermittent claudication ([place]) 03/15/2018  . Chronic left-sided low back pain with left-sided sciatica 12/25/2017  . Facial droop   . History of CVA (cerebrovascular accident) 10/23/2017  . Critical lower limb ischemia 11/08/2016  . Smoker 11/08/2016  . Postinflammatory pulmonary fibrosis ([place]) 02/14/2016  . Cigarette smoker 12/24/2015  . Hypothyroid ? 01/16/2014  . Mild cognitive impairment 01/16/2014  . Meningioma ([place]) 10/21/2013  . Chest pain 10/20/2013  . Medicare annual wellness visit, subsequent 06/16/2013  . Dizziness and giddiness 06/16/2013  . RLS (restless legs syndrome) 04/29/2012  . Abdominal pain 12/27/2010  . DEGENERATIVE JOINT DISEASE 09/23/2010  . CAROTID ARTERY DISEASE 08/15/2010  . CHEST PAIN 08/15/2010  . HEADACHE 12/02/2009  . BACK PAIN 11/22/2009  . CONSTIPATION, CHRONIC 01/29/2008  . NAUSEA 01/28/2008  . DEPRESSION 03/08/2007  . HTN (hypertension) 03/08/2007   Past Medical History:  Diagnosis Date  . AAA (abdominal aortic aneurysm) (HCC)    3.1 cm 07/08/18, 3 year follow-up recommended; possible 3 cm AAA by aortogram 09/13/18  . Anemia    PMH  . Appendicitis with abscess    07/08/18, s/p perc drain; resolved 07/30/18 by CT  . Arthritis   . Bipolar disorder ([place])   . Cerebrovascular disease    intra and extracranial vascular dx per MRI 4/11, neurology rec strict CVRF control  . Colonic inertia   . Constipation    chronic;severe  . Coronary artery disease   . Depression   . Duodenitis   . EKG abnormalities    changes, stress test neg (false EKG changes)  . Gastritis   . GERD (gastroesophageal reflux disease)   . Hypertension   . Hypothyroid 01/16/2014  . Meningioma ([place]) 10/21/2013  . PAD (peripheral artery disease) ([place])   . Psoriasis    sees derm  . Stroke ([place])   . Wears dentures   . Wears glasses    Past Surgical History:  Procedure Laterality Date  . ABDOMINAL  AORTOGRAM W/LOWER EXTREMITY N/A 09/13/2018   Procedure: ABDOMINAL AORTOGRAM W/LOWER EXTREMITY;  Surgeon: Elam Dutch, MD;  Location: [facility];  Service: Cardiovascular;  Laterality: N/A;  . arthroscopy  04/2010   Right knee  . CATARACT EXTRACTION W/ INTRAOCULAR LENS  IMPLANT, BILATERAL    . COLONOSCOPY    . FEMORAL-POPLITEAL BYPASS GRAFT  10/22/2018  . FEMORAL-POPLITEAL BYPASS GRAFT Left 10/22/2018   Procedure: LEFT FEMORAL TO BELOW THE KNEE POPLITEAL ARTERY BYPASS GRAFT;  Surgeon: Elam Dutch, MD;  Location: [facility];  Service: Vascular;  Laterality: Left;  . I & D EXTREMITY Left 11/08/2018   Procedure: IRRIGATION AND DEBRIDEMENT EXTREMITY Left Leg;  Surgeon: Elam Dutch, MD;  Location: [facility];  Service: Vascular;  Laterality: Left;  . IR RADIOLOGIST EVAL & MGMT  07/30/2018  . MULTIPLE TOOTH EXTRACTIONS    . TUBAL LIGATION     No Known Allergies Prior to Admission medications   Medication Sig Start Date End Date Taking? Authorizing Provider  amLODipine (NORVASC) 2.5 MG tablet Take 1 tablet (2.5 mg total) by mouth daily. 11/28/19  Yes Caren Griffins, MD  clonazePAM (KLONOPIN) 0.5 MG tablet Take 1 tablet (0.5 mg total) by mouth 2 (two) times daily as needed for anxiety. TAKE 1 TABLET BY MOUTH AT BEDTIME FOR ANXIETY 11/27/19  Yes Gherghe, Vella Redhead, MD  DULoxetine (CYMBALTA) 30 MG capsule Take 1 capsule (30 mg total) by mouth every evening. Patient taking differently: Take 30 mg by mouth 2 (two) times daily.  04/15/19  Yes Cottle, Billey Co., MD  famotidine (PEPCID) 20 MG tablet One at bedtime Patient taking differently: Take 20 mg by mouth at bedtime. One at bedtime 02/14/16  Yes Tanda Rockers, MD  furosemide (LASIX) 40 MG tablet Take 1 tablet (40 mg total) by mouth daily. 12/19/19  Yes [name], MD  lamoTRIgine (LAMICTAL) 150 MG tablet TAKE 1 TABLET(150 MG) BY MOUTH TWICE DAILY Patient taking differently: Take 75 mg by mouth 2 (two) times daily.  05/23/19  Yes  Cottle, Billey Co., MD  linaclotide [facility]) 145 MCG CAPS capsule Take 1 capsule (145 mcg total) by mouth daily before breakfast. 05/14/19  Yes Willia Craze, NP  Melatonin 10 MG TABS Take 20 mg by mouth at bedtime.   Yes [provider]  ondansetron (ZOFRAN ODT) 4 MG disintegrating tablet Take 1 tablet (4 mg total) by mouth every 8 (eight) hours as needed for nausea or vomiting. 12/26/19  Yes Wendall Mola, NP  polyethylene glycol powder (GLYCOLAX/MIRALAX) powder Take 17 g by mouth daily as needed for moderate constipation.    Yes [provider]  predniSONE (DELTASONE) 20 MG tablet 60mg  x 2 days, 40mg  x 4 days, 20mg  x 4 days 12/26/19  Yes Wendall Mola, NP  rivaroxaban (XARELTO) 20 MG TABS tablet Take 1 tablet (20 mg total) by mouth daily with supper. 10/02/19  Yes Fields, Jessy Oto, MD  rOPINIRole (REQUIP) 2 MG tablet TAKE 1 AND 1/2 TABLETS(3 MG) BY MOUTH AT BEDTIME Patient taking differently: Take 3 mg by mouth at bedtime.  10/31/19  Yes Cottle, Billey Co., MD   Social History   Socioeconomic History  . Marital status: Widowed    Spouse name: Not on file  . Number of children: 1  . Years of education: Not on file  . Highest education level: Not on file  Occupational History  . Occupation: retired  Tobacco Use  . Smoking status: Former Smoker    Packs/day: 0.75    Years: 60.00    Pack years: 45.00    Types: Cigarettes  . Smokeless tobacco: Never Used  Substance and Sexual Activity  . Alcohol use: Yes    Alcohol/week: 0.0 standard drinks    Comment: yes on occassion  . Drug use: No  . Sexual activity: Not Currently  Other Topics Concern  . Not on file  Social History Narrative   Brother in law Mr Rexford Maus (one of my patients)   Lives w/ husband       Social Determinants of Health   Financial Resource Strain:   . Difficulty of Paying Living Expenses:   Food Insecurity:   . Worried About Charity fundraiser in the Last Year:   . Arts development officer in the Last Year:   Transportation Needs:   . Film/video editor (Medical):   Marland Kitchen Lack of Transportation (Non-Medical):   Physical Activity:   . Days of Exercise per Week:   . Minutes of Exercise per Session:   Stress:   . Feeling  of Stress :   Social Connections:   . Frequency of Communication with Friends and Family:   . Frequency of Social Gatherings with Friends and Family:   . Attends Religious Services:   . Active Member of Clubs or Organizations:   . Attends Archivist Meetings:   Marland Kitchen Marital Status:   Intimate Partner Violence:   . Fear of Current or Ex-Partner:   . Emotionally Abused:   Marland Kitchen Physically Abused:   . Sexually Abused:     Review of Systems Per HPI  Objective:   Vitals:   01/23/20 1559  BP: (!) 174/84  Pulse: 73  Resp: 14  Temp: 97.8 F (36.6 C)  TempSrc: Temporal  SpO2: 98%  Weight: 162 lb 3.2 oz (73.6 kg)  Height: 5\' 8"  (1.727 m)     Physical Exam Vitals reviewed.  Constitutional:      Appearance: She is well-developed.  HENT:     Head: Normocephalic and atraumatic.  Eyes:     Conjunctiva/sclera: Conjunctivae normal.     Pupils: Pupils are equal, round, and reactive to light.  Neck:     Vascular: No carotid bruit.  Cardiovascular:     Rate and Rhythm: Normal rate and regular rhythm.     Heart sounds: Murmur (2-3/6 SEM) present.  Pulmonary:     Effort: Pulmonary effort is normal.     Breath sounds: No stridor. Rhonchi (few coarse bs lower, no distress,. speaking in full sentences off O2. ) present.  Abdominal:     Palpations: Abdomen is soft. There is no pulsatile mass.     Tenderness: There is no abdominal tenderness.  Musculoskeletal:     Right lower leg: No edema.  Skin:    General: Skin is warm and dry.  Neurological:     Mental Status: She is alert and oriented to person, place, and time.  Psychiatric:        Behavior: Behavior normal.     Assessment & Plan:  FRICA LAHOOD is a 74 y.o. female  . Acute on chronic respiratory failure with hypoxia (HCC) Idiopathic pulmonary fibrosis (HCC)  -Overall improving, with less O2 need.  Continue same plan for now with follow-up with pulmonology as planned.  RTC/ER precautions.  Nausea - Plan: Ambulatory referral to Gastroenterology Diarrhea, unspecified type - Plan: Ambulatory referral to Gastroenterology  -Chronic nausea, chronic diarrhea with IBS previously.  Reports nausea with medications and has self adjusted previously.  Denies much improvement with use of Zofran.  Have discussed follow-up with gastroenterology previously, but she has been hesitant to do so as fearful of endoscopy/colonoscopy.  Still recommend she discuss the symptoms with GI and will place referral.  Abdomen nontender at present, ER precautions given.  Hypertension  -Elevated in office, has not taken medication yet today.  Plan for nurse visit in 5 days for repeat blood pressure check, daily adherence discussed.  Peripheral vascular disease  -Denies new/acute symptoms, plans on follow-up with her vascular specialist.  Commended on her smoking cessation.  Handout given on continued assistance with coping.  No orders of the defined types were placed in this encounter.  Patient Instructions   Blood pressure elevated today - make sure to take amlodipine daily.recheck for nurse visit in 5 days for your blood pressure.   I will try to get you an appointment with gastroenterology. It is very important that you see them to discuss the nausea and diarrhea.   Keep follow up with pulmonary as scheduled. If  any worsening of breathing - be seen right away - here or emergency room.    Follow up with your vascular specialist. Return to the clinic or go to the nearest emergency room if any of your symptoms worsen or new symptoms occur.  Great work with quitting smoking!   Coping with Quitting Smoking  Quitting smoking is a physical and mental challenge. You will face cravings,  withdrawal symptoms, and temptation. Before quitting, work with your health care provider to make a plan that can help you cope. Preparation can help you quit and keep you from giving in. How can I cope with cravings? Cravings usually last for 5-10 minutes. If you get through it, the craving will pass. Consider taking the following actions to help you cope with cravings:  Keep your mouth busy: ? Chew sugar-free gum. ? Suck on hard candies or a straw. ? Brush your teeth.  Keep your hands and body busy: ? Immediately change to a different activity when you feel a craving. ? Squeeze or play with a ball. ? Do an activity or a hobby, like making bead jewelry, practicing needlepoint, or working with wood. ? Mix up your normal routine. ? Take a short exercise break. Go for a quick walk or run up and down stairs. ? Spend time in public places where smoking is not allowed.  Focus on doing something kind or helpful for someone else.  Call a friend or family member to talk during a craving.  Join a support group.  Call a quit line, such as 1-800-QUIT-NOW.  Talk with your health care provider about medicines that might help you cope with cravings and make quitting easier for you. How can I deal with withdrawal symptoms? Your body may experience negative effects as it tries to get used to not having nicotine in the system. These effects are called withdrawal symptoms. They may include:  Feeling hungrier than normal.  Trouble concentrating.  Irritability.  Trouble sleeping.  Feeling depressed.  Restlessness and agitation.  Craving a cigarette. To manage withdrawal symptoms:  Avoid places, people, and activities that trigger your cravings.  Remember why you want to quit.  Get plenty of sleep.  Avoid coffee and other caffeinated drinks. These may worsen some of your symptoms. How can I handle social situations? Social situations can be difficult when you are quitting smoking,  especially in the first few weeks. To manage this, you can:  Avoid parties, bars, and other social situations where people might be smoking.  Avoid alcohol.  Leave right away if you have the urge to smoke.  Explain to your family and friends that you are quitting smoking. Ask for understanding and support.  Plan activities with friends or family where smoking is not an option. What are some ways I can cope with stress? Wanting to smoke may cause stress, and stress can make you want to smoke. Find ways to manage your stress. Relaxation techniques can help. For example:  Breathe slowly and deeply, in through your nose and out through your mouth.  Listen to soothing, relaxing music.  Talk with a family member or friend about your stress.  Light a candle.  Soak in a bath or take a shower.  Think about a peaceful place. What are some ways I can prevent weight gain? Be aware that many people gain weight after they quit smoking. However, not everyone does. To keep from gaining weight, have a plan in place before you quit and stick to the  plan after you quit. Your plan should include:  Having healthy snacks. When you have a craving, it may help to: ? Eat plain popcorn, crunchy carrots, celery, or other cut vegetables. ? Chew sugar-free gum.  Changing how you eat: ? Eat small portion sizes at meals. ? Eat 4-6 small meals throughout the day instead of 1-2 large meals a day. ? Be mindful when you eat. Do not watch television or do other things that might distract you as you eat.  Exercising regularly: ? Make time to exercise each day. If you do not have time for a long workout, do short bouts of exercise for 5-10 minutes several times a day. ? Do some form of strengthening exercise, like weight lifting, and some form of aerobic exercise, like running or swimming.  Drinking plenty of water or other low-calorie or no-calorie drinks. Drink 6-8 glasses of water daily, or as much as  instructed by your health care provider. Summary  Quitting smoking is a physical and mental challenge. You will face cravings, withdrawal symptoms, and temptation to smoke again. Preparation can help you as you go through these challenges.  You can cope with cravings by keeping your mouth busy (such as by chewing gum), keeping your body and hands busy, and making calls to family, friends, or a helpline for people who want to quit smoking.  You can cope with withdrawal symptoms by avoiding places where people smoke, avoiding drinks with caffeine, and getting plenty of rest.  Ask your health care provider about the different ways to prevent weight gain, avoid stress, and handle social situations. This information is not intended to replace advice given to you by your health care provider. Make sure you discuss any questions you have with your health care provider. Document Revised: 08/10/2017 Document Reviewed: 08/25/2016 Elsevier Patient Education  [employer].    If you have lab work done today you will be contacted with your lab results within the next 2 weeks.  If you have not heard from [place] then please contact us. The fastest way to get your results is to register for My Chart.   IF you received an x-ray today, you will receive an invoice from [facility]. Please contact [facility] at [phone] with questions or concerns regarding your invoice.   IF you received labwork today, you will receive an invoice from [facility]. Please contact [facility] at [phone] with questions or concerns regarding your invoice.   Our billing staff will not be able to assist you with questions regarding bills from these companies.  You will be contacted with the lab results as soon as they are available. The fastest way to get your results is to activate your My Chart account. Instructions are located on the last page of this paperwork. If you have not heard from [place] regarding the  results in 2 weeks, please contact this office.         Signed, Merri Ray, MD [facility]

## 2020-01-23 NOTE — Patient Instructions (Addendum)
Blood pressure elevated today - make sure to take amlodipine daily.recheck for nurse visit in 5 days for your blood pressure.   I will try to get you an appointment with gastroenterology. It is very important that you see them to discuss the nausea and diarrhea.   Keep follow up with pulmonary as scheduled. If any worsening of breathing - be seen right away - here or emergency room.    Follow up with your vascular specialist. Return to the clinic or go to the nearest emergency room if any of your symptoms worsen or new symptoms occur.  Great work with quitting smoking!   Coping with Quitting Smoking  Quitting smoking is a physical and mental challenge. You will face cravings, withdrawal symptoms, and temptation. Before quitting, work with your health care provider to make a plan that can help you cope. Preparation can help you quit and keep you from giving in. How can I cope with cravings? Cravings usually last for 5-10 minutes. If you get through it, the craving will pass. Consider taking the following actions to help you cope with cravings:  Keep your mouth busy: ? Chew sugar-free gum. ? Suck on hard candies or a straw. ? Brush your teeth.  Keep your hands and body busy: ? Immediately change to a different activity when you feel a craving. ? Squeeze or play with a ball. ? Do an activity or a hobby, like making bead jewelry, practicing needlepoint, or working with wood. ? Mix up your normal routine. ? Take a short exercise break. Go for a quick walk or run up and down stairs. ? Spend time in public places where smoking is not allowed.  Focus on doing something kind or helpful for someone else.  Call a friend or family member to talk during a craving.  Join a support group.  Call a quit line, such as 1-800-QUIT-NOW.  Talk with your health care provider about medicines that might help you cope with cravings and make quitting easier for you. How can I deal with withdrawal  symptoms? Your body may experience negative effects as it tries to get used to not having nicotine in the system. These effects are called withdrawal symptoms. They may include:  Feeling hungrier than normal.  Trouble concentrating.  Irritability.  Trouble sleeping.  Feeling depressed.  Restlessness and agitation.  Craving a cigarette. To manage withdrawal symptoms:  Avoid places, people, and activities that trigger your cravings.  Remember why you want to quit.  Get plenty of sleep.  Avoid coffee and other caffeinated drinks. These may worsen some of your symptoms. How can I handle social situations? Social situations can be difficult when you are quitting smoking, especially in the first few weeks. To manage this, you can:  Avoid parties, bars, and other social situations where people might be smoking.  Avoid alcohol.  Leave right away if you have the urge to smoke.  Explain to your family and friends that you are quitting smoking. Ask for understanding and support.  Plan activities with friends or family where smoking is not an option. What are some ways I can cope with stress? Wanting to smoke may cause stress, and stress can make you want to smoke. Find ways to manage your stress. Relaxation techniques can help. For example:  Breathe slowly and deeply, in through your nose and out through your mouth.  Listen to soothing, relaxing music.  Talk with a family member or friend about your stress.  Light a candle.  Soak in a bath or take a shower.  Think about a peaceful place. What are some ways I can prevent weight gain? Be aware that many people gain weight after they quit smoking. However, not everyone does. To keep from gaining weight, have a plan in place before you quit and stick to the plan after you quit. Your plan should include:  Having healthy snacks. When you have a craving, it may help to: ? Eat plain popcorn, crunchy carrots, celery, or other cut  vegetables. ? Chew sugar-free gum.  Changing how you eat: ? Eat small portion sizes at meals. ? Eat 4-6 small meals throughout the day instead of 1-2 large meals a day. ? Be mindful when you eat. Do not watch television or do other things that might distract you as you eat.  Exercising regularly: ? Make time to exercise each day. If you do not have time for a long workout, do short bouts of exercise for 5-10 minutes several times a day. ? Do some form of strengthening exercise, like weight lifting, and some form of aerobic exercise, like running or swimming.  Drinking plenty of water or other low-calorie or no-calorie drinks. Drink 6-8 glasses of water daily, or as much as instructed by your health care provider. Summary  Quitting smoking is a physical and mental challenge. You will face cravings, withdrawal symptoms, and temptation to smoke again. Preparation can help you as you go through these challenges.  You can cope with cravings by keeping your mouth busy (such as by chewing gum), keeping your body and hands busy, and making calls to family, friends, or a helpline for people who want to quit smoking.  You can cope with withdrawal symptoms by avoiding places where people smoke, avoiding drinks with caffeine, and getting plenty of rest.  Ask your health care provider about the different ways to prevent weight gain, avoid stress, and handle social situations. This information is not intended to replace advice given to you by your health care provider. Make sure you discuss any questions you have with your health care provider. Document Revised: 08/10/2017 Document Reviewed: 08/25/2016 Elsevier Patient Education  [employer].    If you have lab work done today you will be contacted with your lab results within the next 2 weeks.  If you have not heard from [place] then please contact us. The fastest way to get your results is to register for My Chart.   IF you received an x-ray  today, you will receive an invoice from [facility]. Please contact [facility] at [phone] with questions or concerns regarding your invoice.   IF you received labwork today, you will receive an invoice from [facility]. Please contact [facility] at [phone] with questions or concerns regarding your invoice.   Our billing staff will not be able to assist you with questions regarding bills from these companies.  You will be contacted with the lab results as soon as they are available. The fastest way to get your results is to activate your My Chart account. Instructions are located on the last page of this paperwork. If you have not heard from [place] regarding the results in 2 weeks, please contact this office.

## 2020-01-25 ENCOUNTER — Encounter: Payer: Self-pay | Admitting: Family Medicine

## 2020-01-27 ENCOUNTER — Telehealth: Payer: Self-pay | Admitting: Family Medicine

## 2020-01-27 NOTE — Telephone Encounter (Signed)
Spoke with Sree with [facility] and stated that pt has refused therapy but OT will go in one more time with pt.

## 2020-01-27 NOTE — Telephone Encounter (Signed)
Please call Sree with [facility] / Maximino Greenland is calling to let provider know that she has refused physical therapy the last  2 visits / care taker reported some stomach pain . Patient was last seen by OT on  01/16/2020   Please reach out to [place] at [phone]

## 2020-01-30 ENCOUNTER — Telehealth: Payer: Self-pay | Admitting: Family Medicine

## 2020-01-30 DIAGNOSIS — I5033 Acute on chronic diastolic (congestive) heart failure: Secondary | ICD-10-CM | POA: Diagnosis not present

## 2020-01-30 DIAGNOSIS — J9601 Acute respiratory failure with hypoxia: Secondary | ICD-10-CM | POA: Diagnosis not present

## 2020-01-30 DIAGNOSIS — I110 Hypertensive heart disease with heart failure: Secondary | ICD-10-CM | POA: Diagnosis not present

## 2020-01-30 DIAGNOSIS — I2510 Atherosclerotic heart disease of native coronary artery without angina pectoris: Secondary | ICD-10-CM | POA: Diagnosis not present

## 2020-01-30 DIAGNOSIS — F319 Bipolar disorder, unspecified: Secondary | ICD-10-CM | POA: Diagnosis not present

## 2020-01-30 DIAGNOSIS — J449 Chronic obstructive pulmonary disease, unspecified: Secondary | ICD-10-CM | POA: Diagnosis not present

## 2020-01-30 NOTE — Telephone Encounter (Signed)
Timmothy Sours from [facility] is calling to let Dr. Carlota Raspberry know pt was accessed today. She is going to require an additional OT visit to complete her goals. Please advise  Timmothy Sours at [phone]. It's fine to leave a verbal ok.

## 2020-01-30 NOTE — Telephone Encounter (Signed)
Called Don back and gave the verbal okay on her OT orders.

## 2020-02-02 ENCOUNTER — Encounter: Payer: Self-pay | Admitting: Family Medicine

## 2020-02-06 NOTE — Telephone Encounter (Signed)
Orders received, reviewed, signed and faxed back with confirmation to [facility] @ [phone]

## 2020-02-13 ENCOUNTER — Telehealth: Payer: Self-pay | Admitting: Family Medicine

## 2020-02-13 NOTE — Telephone Encounter (Signed)
Error

## 2020-02-13 NOTE — Telephone Encounter (Signed)
FYI to message below from [facility].

## 2020-02-13 NOTE — Telephone Encounter (Signed)
6:39 PM Noted.   I called patient to check status of the abdominal pain.  No answer.  Advised her to be seen at urgent care or emergency room over the weekend if any new or worsening symptoms since her last visit.  Please call patient on Monday and check status.

## 2020-02-13 NOTE — Telephone Encounter (Signed)
[place] called wanting provider to know that pt declined last occupational appt. Pt reported stomach pain. Pt is at the end of certification period and is discharged from the agency.  [employer] nurses number: [phone] Please advise.

## 2020-02-14 ENCOUNTER — Encounter: Payer: Self-pay | Admitting: Family Medicine

## 2020-02-16 NOTE — Telephone Encounter (Signed)
FYI: Thank you for your message the other day regarding any new symptoms.  I don't know which caretaker called about me but I do appreciate his concern.  I did have an incident and we called an ambulance but I recovered and I stayed home.  I am okay and don't need urgent care.  Thanks so much for calling me.  It is great to Know you are watching out for me!  I appreciate you very much! Thanks again! [name]

## 2020-02-19 ENCOUNTER — Ambulatory Visit: Payer: Medicare Other | Admitting: Pulmonary Disease

## 2020-02-20 ENCOUNTER — Telehealth (INDEPENDENT_AMBULATORY_CARE_PROVIDER_SITE_OTHER): Payer: Medicare Other | Admitting: Family Medicine

## 2020-02-20 ENCOUNTER — Other Ambulatory Visit: Payer: Self-pay

## 2020-02-20 DIAGNOSIS — Z23 Encounter for immunization: Secondary | ICD-10-CM | POA: Diagnosis not present

## 2020-02-20 DIAGNOSIS — J9621 Acute and chronic respiratory failure with hypoxia: Secondary | ICD-10-CM

## 2020-02-20 NOTE — Patient Instructions (Signed)
° ° ° °  If you have lab work done today you will be contacted with your lab results within the next 2 weeks.  If you have not heard from us then please contact us. The fastest way to get your results is to register for My Chart. ° ° °IF you received an x-ray today, you will receive an invoice from [facility]. Please contact [facility] at [phone] with questions or concerns regarding your invoice.  ° °IF you received labwork today, you will receive an invoice from [facility]. Please contact [facility] at [phone] with questions or concerns regarding your invoice.  ° °Our billing staff will not be able to assist you with questions regarding bills from these companies. ° °You will be contacted with the lab results as soon as they are available. The fastest way to get your results is to activate your My Chart account. Instructions are located on the last page of this paperwork. If you have not heard from us regarding the results in 2 weeks, please contact this office. °  ° ° ° °

## 2020-02-20 NOTE — Progress Notes (Signed)
Virtual Visit via Telephone Note  I connected with [name] on 02/20/20 at 4:09 PM  By phone. - unable to speak at this time - will call later this evening at her request.  internet out - unable to video conference. verified that I am speaking with the correct person using two identifiers.    Contacted pt at 8:03pm - no answer- left message that I would call her back. Called again at 8:15pm - no answerl left message that I will try to reach her tomorrow or Monday.    1:56 PM On 02/21/20 No answer, left voicemail. Will try to reach hear again early next week.    Chief complaint: Chief Complaint  Patient presents with  . Nicotine Dependence    pt started on her cesation per last visit notes, she still smokes a few times a week but is trying very hard to quit completly.  pt notes she does feel better the days she does not smoke but that it has been very difficult to stop.  . Depression    PHQ 2 -9 score 19 pt did note she has some suicidal ideations but does not plan to act on these. Recent loss of husband and sister with little support system near by has worsened this.     History of Present Illness:  follow-up from May 14 visit.  Acute on chronic respiratory failure with hypoxia, idiopathic pulmonary fibrosis Improved last visit with less O2 need.  Continued plan follow-up with pulmonary.  Appointment was scheduled yesterday with Dr. Elliot Gault but that was canceled.  It appears her next pulmonary appointment is scheduled for June 28 with Tammy Parrett.     Chronic nausea, diarrhea with IBS Has had to adjust medications previously for her nausea.  Minimal relief with Zofran last visit, chronic symptoms but I did recommend she discuss her symptoms with GI and was referred.  Abdomen was nontender at that visit.   Hypertension with peripheral vascular disease, tobacco abuse.  BP was elevated in office 5/14, but she was off medications that day.  Plan for nurse visit for blood  pressure recheck, but has not had visit. Virtual visit was requested today. Commended on smoking cessation last visit.Handout on coping with quitting given.  Recommended follow-up with her vascular specialist last visit.  Depression: History of major depressive disorder, generalized anxiety disorder under the care of Dr. Clovis Pu with psychiatry, and counseling with Luan Moore. bereavement with loss of her sister susie and husband [name] prior. no intent/plan of SI per triage note.   Depression screen [facility] 2/9 02/20/2020 01/23/2020 12/26/2019 12/25/2019 11/10/2019  Decreased Interest 1 1 0 1 0  Down, Depressed, Hopeless 3 1 0 2 2  PHQ - 2 Score 4 2 0 3 2  Altered sleeping 3 0 0 0 0  Tired, decreased energy 3 1 0 3 3  Change in appetite 2 1 0 3 3  Feeling bad or failure about yourself  2 0 0 3 3  Trouble concentrating 1 0 0 3 2  Moving slowly or fidgety/restless 2 0 0 0 0  Suicidal thoughts 2 0 0 0 0  PHQ-9 Score 19 4 0 15 13  Difficult doing work/chores Very difficult - - - -  Some recent data might be hidden       Patient Active Problem List   Diagnosis Date Noted  . Acute on chronic respiratory failure with hypoxia ([place]) 12/26/2019  . Acute on chronic diastolic heart failure ([place])  12/26/2019  . Shortness of breath 12/26/2019  . Bilateral pulmonary infiltrates on CXR   . Acute hypoxemic respiratory failure ([place])   . Complicated UTI (urinary tract infection) 11/11/2019  . AKI (acute kidney injury) ([place]) 11/11/2019  . Hyponatremia 11/11/2019  . Elevated LFTs 11/11/2019  . Sepsis ([place]) 11/11/2019  . Wound infection 11/07/2018  . Postoperative pain   . Sleep disturbance   . Tobacco abuse   . Acute blood loss anemia   . Hypoalbuminemia due to protein-calorie malnutrition ([place])   . Debility 10/24/2018  . Pre-operative cardiovascular examination 09/26/2018  . HLD (hyperlipidemia) 09/26/2018  . GAD (generalized anxiety disorder) 08/07/2018  . Major depressive disorder, single  episode 08/07/2018  . Appendiceal abscess 07/08/2018  . PVD (peripheral vascular disease) ([place]) 03/15/2018  . Atherosclerosis of native artery of left lower extremity with intermittent claudication ([place]) 03/15/2018  . Chronic left-sided low back pain with left-sided sciatica 12/25/2017  . Facial droop   . History of CVA (cerebrovascular accident) 10/23/2017  . Critical lower limb ischemia 11/08/2016  . Smoker 11/08/2016  . Postinflammatory pulmonary fibrosis ([place]) 02/14/2016  . Cigarette smoker 12/24/2015  . Hypothyroid ? 01/16/2014  . Mild cognitive impairment 01/16/2014  . Meningioma ([place]) 10/21/2013  . Chest pain 10/20/2013  . Medicare annual wellness visit, subsequent 06/16/2013  . Dizziness and giddiness 06/16/2013  . RLS (restless legs syndrome) 04/29/2012  . Abdominal pain 12/27/2010  . DEGENERATIVE JOINT DISEASE 09/23/2010  . CAROTID ARTERY DISEASE 08/15/2010  . CHEST PAIN 08/15/2010  . HEADACHE 12/02/2009  . BACK PAIN 11/22/2009  . CONSTIPATION, CHRONIC 01/29/2008  . NAUSEA 01/28/2008  . DEPRESSION 03/08/2007  . HTN (hypertension) 03/08/2007   Past Medical History:  Diagnosis Date  . AAA (abdominal aortic aneurysm) (HCC)    3.1 cm 07/08/18, 3 year follow-up recommended; possible 3 cm AAA by aortogram 09/13/18  . Anemia    PMH  . Appendicitis with abscess    07/08/18, s/p perc drain; resolved 07/30/18 by CT  . Arthritis   . Bipolar disorder ([place])   . Cerebrovascular disease    intra and extracranial vascular dx per MRI 4/11, neurology rec strict CVRF control  . Colonic inertia   . Constipation    chronic;severe  . Coronary artery disease   . Depression   . Duodenitis   . EKG abnormalities    changes, stress test neg (false EKG changes)  . Gastritis   . GERD (gastroesophageal reflux disease)   . Hypertension   . Hypothyroid 01/16/2014  . Meningioma ([place]) 10/21/2013  . PAD (peripheral artery disease) ([place])   . Psoriasis    sees derm  . Stroke ([place])   . Wears  dentures   . Wears glasses    Past Surgical History:  Procedure Laterality Date  . ABDOMINAL AORTOGRAM W/LOWER EXTREMITY N/A 09/13/2018   Procedure: ABDOMINAL AORTOGRAM W/LOWER EXTREMITY;  Surgeon: Elam Dutch, MD;  Location: [facility];  Service: Cardiovascular;  Laterality: N/A;  . arthroscopy  04/2010   Right knee  . CATARACT EXTRACTION W/ INTRAOCULAR LENS  IMPLANT, BILATERAL    . COLONOSCOPY    . FEMORAL-POPLITEAL BYPASS GRAFT  10/22/2018  . FEMORAL-POPLITEAL BYPASS GRAFT Left 10/22/2018   Procedure: LEFT FEMORAL TO BELOW THE KNEE POPLITEAL ARTERY BYPASS GRAFT;  Surgeon: Elam Dutch, MD;  Location: [facility];  Service: Vascular;  Laterality: Left;  . I & D EXTREMITY Left 11/08/2018   Procedure: IRRIGATION AND DEBRIDEMENT EXTREMITY Left Leg;  Surgeon: Elam Dutch,  MD;  Location: [facility];  Service: Vascular;  Laterality: Left;  . IR RADIOLOGIST EVAL & MGMT  07/30/2018  . MULTIPLE TOOTH EXTRACTIONS    . TUBAL LIGATION     No Known Allergies Prior to Admission medications   Medication Sig Start Date End Date Taking? Authorizing Provider  amLODipine (NORVASC) 2.5 MG tablet Take 1 tablet (2.5 mg total) by mouth daily. 11/28/19   Caren Griffins, MD  clonazePAM (KLONOPIN) 0.5 MG tablet Take 1 tablet (0.5 mg total) by mouth 2 (two) times daily as needed for anxiety. TAKE 1 TABLET BY MOUTH AT BEDTIME FOR ANXIETY 11/27/19   Caren Griffins, MD  DULoxetine (CYMBALTA) 30 MG capsule Take 1 capsule (30 mg total) by mouth every evening. Patient taking differently: Take 30 mg by mouth 2 (two) times daily.  04/15/19   Cottle, Billey Co., MD  famotidine (PEPCID) 20 MG tablet One at bedtime Patient taking differently: Take 20 mg by mouth at bedtime. One at bedtime 02/14/16   Tanda Rockers, MD  lamoTRIgine (LAMICTAL) 150 MG tablet TAKE 1 TABLET(150 MG) BY MOUTH TWICE DAILY Patient taking differently: Take 75 mg by mouth 2 (two) times daily.  05/23/19   Cottle, Billey Co., MD    linaclotide [facility]) 145 MCG CAPS capsule Take 1 capsule (145 mcg total) by mouth daily before breakfast. 05/14/19   Willia Craze, NP  Melatonin 10 MG TABS Take 20 mg by mouth at bedtime.    [provider]  ondansetron (ZOFRAN ODT) 4 MG disintegrating tablet Take 1 tablet (4 mg total) by mouth every 8 (eight) hours as needed for nausea or vomiting. 12/26/19   Wendall Mola, NP  polyethylene glycol powder (GLYCOLAX/MIRALAX) powder Take 17 g by mouth daily as needed for moderate constipation.     [provider]  rivaroxaban (XARELTO) 20 MG TABS tablet Take 1 tablet (20 mg total) by mouth daily with supper. 10/02/19   Elam Dutch, MD  rOPINIRole (REQUIP) 2 MG tablet TAKE 1 AND 1/2 TABLETS(3 MG) BY MOUTH AT BEDTIME Patient taking differently: Take 3 mg by mouth at bedtime.  10/31/19   Cottle, Billey Co., MD   Social History   Socioeconomic History  . Marital status: Widowed    Spouse name: Not on file  . Number of children: 1  . Years of education: Not on file  . Highest education level: Not on file  Occupational History  . Occupation: retired  Tobacco Use  . Smoking status: Former Smoker    Packs/day: 0.75    Years: 60.00    Pack years: 45.00    Types: Cigarettes  . Smokeless tobacco: Never Used  Vaping Use  . Vaping Use: Former  Substance and Sexual Activity  . Alcohol use: Yes    Alcohol/week: 0.0 standard drinks    Comment: yes on occassion  . Drug use: No  . Sexual activity: Not Currently  Other Topics Concern  . Not on file  Social History Narrative   Brother in law Mr Rexford Maus (one of my patients)   Lives w/ husband       Social Determinants of Health   Financial Resource Strain:   . Difficulty of Paying Living Expenses:   Food Insecurity:   . Worried About Charity fundraiser in the Last Year:   . Arboriculturist in the Last Year:   Transportation Needs:   . Film/video editor (Medical):   Marland Kitchen  Lack of Transportation  (Non-Medical):   Physical Activity:   . Days of Exercise per Week:   . Minutes of Exercise per Session:   Stress:   . Feeling of Stress :   Social Connections:   . Frequency of Communication with Friends and Family:   . Frequency of Social Gatherings with Friends and Family:   . Attends Religious Services:   . Active Member of Clubs or Organizations:   . Attends Archivist Meetings:   Marland Kitchen Marital Status:   Intimate Partner Violence:   . Fear of Current or Ex-Partner:   . Emotionally Abused:   Marland Kitchen Physically Abused:   . Sexually Abused:   plan to reschedule visit.    Signed,   Merri Ray, MD [facility] [place].  02/20/20

## 2020-02-21 ENCOUNTER — Encounter: Payer: Self-pay | Admitting: Family Medicine

## 2020-02-23 ENCOUNTER — Telehealth (INDEPENDENT_AMBULATORY_CARE_PROVIDER_SITE_OTHER): Payer: Medicare Other | Admitting: Family Medicine

## 2020-02-23 ENCOUNTER — Other Ambulatory Visit: Payer: Self-pay

## 2020-02-23 VITALS — BP 139/73 | Ht 68.0 in | Wt 162.0 lb

## 2020-02-23 DIAGNOSIS — I739 Peripheral vascular disease, unspecified: Secondary | ICD-10-CM | POA: Diagnosis not present

## 2020-02-23 DIAGNOSIS — J84112 Idiopathic pulmonary fibrosis: Secondary | ICD-10-CM | POA: Diagnosis not present

## 2020-02-23 DIAGNOSIS — R110 Nausea: Secondary | ICD-10-CM | POA: Diagnosis not present

## 2020-02-23 DIAGNOSIS — F329 Major depressive disorder, single episode, unspecified: Secondary | ICD-10-CM

## 2020-02-23 DIAGNOSIS — R002 Palpitations: Secondary | ICD-10-CM

## 2020-02-23 DIAGNOSIS — I5033 Acute on chronic diastolic (congestive) heart failure: Secondary | ICD-10-CM | POA: Diagnosis not present

## 2020-02-23 DIAGNOSIS — J029 Acute pharyngitis, unspecified: Secondary | ICD-10-CM | POA: Diagnosis not present

## 2020-02-23 DIAGNOSIS — R5383 Other fatigue: Secondary | ICD-10-CM | POA: Diagnosis not present

## 2020-02-23 DIAGNOSIS — R197 Diarrhea, unspecified: Secondary | ICD-10-CM | POA: Diagnosis not present

## 2020-02-23 MED ORDER — RIVAROXABAN 20 MG PO TABS: 20.0000 mg | ORAL_TABLET | Freq: Every day | ORAL | 11 refills | Status: DC

## 2020-02-23 NOTE — Patient Instructions (Addendum)
Call gastroenterology about your nausea and that zofran is not helping. Return to the clinic or go to the nearest emergency room if any of your symptoms worsen or new symptoms occur.  If you have any abdominal pain or return vomiting, go to emergency room.   Salt water gargles for sore throat, but if not improving in next 24-48 hours, be seen here or other medical provider.    Ok to try saline spray to help with congestion to see if that will help congestion that may be causing some of the nausea. Ok to try zofran temporarily as well.   Call Dr. Oneida Alar office to follow up on leg pain and circulation. Let me know which cardiologist you would like to see to followup on CHF and palpitations.   If any worse palpitations, chest pains, or other new or other worsening symptoms.   Please call your therapist to discuss your depression symptoms. If suicide thoughts return call 911, be seen in emergency room or call suicide hotline: 1-800-273-TALK.   Lab only visit tomorrow, then I will see you next week in person.  If any worsening symptoms prior to that time be seen here or the emergency room as we discussed.  Thanks for taking my call today.    Sore Throat A sore throat is pain, burning, irritation, or scratchiness in the throat. When you have a sore throat, you may feel pain or tenderness in your throat when you swallow or talk. Many things can cause a sore throat, including:  An infection.  Seasonal allergies.  Dryness in the air.  Irritants, such as smoke or pollution.  Radiation treatment to the area.  Gastroesophageal reflux disease (GERD).  A tumor. A sore throat is often the first sign of another sickness. It may happen with other symptoms, such as coughing, sneezing, fever, and swollen neck glands. Most sore throats go away without medical treatment. Follow these instructions at home:      Take over-the-counter medicines only as told by your health care provider. ? If your  child has a sore throat, do not give your child aspirin because of the association with Reye syndrome.  Drink enough fluids to keep your urine pale yellow.  Rest as needed.  To help with pain, try: ? Sipping warm liquids, such as broth, herbal tea, or warm water. ? Eating or drinking cold or frozen liquids, such as frozen ice pops. ? Gargling with a salt-water mixture 3-4 times a day or as needed. To make a salt-water mixture, completely dissolve -1 tsp (3-6 g) of salt in 1 cup (237 mL) of warm water. ? Sucking on hard candy or throat lozenges. ? Putting a cool-mist humidifier in your bedroom at night to moisten the air. ? Sitting in the bathroom with the door closed for 5-10 minutes while you run hot water in the shower.  Do not use any products that contain nicotine or tobacco, such as cigarettes, e-cigarettes, and chewing tobacco. If you need help quitting, ask your health care provider.  Wash your hands well and often with soap and water. If soap and water are not available, use hand sanitizer. Contact a health care provider if:  You have a fever for more than 2-3 days.  You have symptoms that last (are persistent) for more than 2-3 days.  Your throat does not get better within 7 days.  You have a fever and your symptoms suddenly get worse.  Your child who is 3 months to 3 years  old has a temperature of 102.68F (39C) or higher. Get help right away if:  You have difficulty breathing.  You cannot swallow fluids, soft foods, or your saliva.  You have increased swelling in your throat or neck.  You have persistent nausea and vomiting. Summary  A sore throat is pain, burning, irritation, or scratchiness in the throat. Many things can cause a sore throat.  Take over-the-counter medicines only as told by your health care provider. Do not give your child aspirin.  Drink plenty of fluids, and rest as needed.  Contact a health care provider if your symptoms worsen or your  sore throat does not get better within 7 days. This information is not intended to replace advice given to you by your health care provider. Make sure you discuss any questions you have with your health care provider. Document Revised: 01/28/2018 Document Reviewed: 01/28/2018 Elsevier Patient Education  2020 [employer].  Palpitations Palpitations are feelings that your heartbeat is irregular or is faster than normal. It may feel like your heart is fluttering or skipping a beat. Palpitations are usually not a serious problem. They may be caused by many things, including smoking, caffeine, alcohol, stress, and certain medicines or drugs. Most causes of palpitations are not serious. However, some palpitations can be a sign of a serious problem. You may need further tests to rule out serious medical problems. Follow these instructions at home:     Pay attention to any changes in your condition. Take these actions to help manage your symptoms: Eating and drinking  Avoid foods and drinks that may cause palpitations. These may include: ? Caffeinated coffee, tea, soft drinks, diet pills, and energy drinks. ? Chocolate. ? Alcohol. Lifestyle  Take steps to reduce your stress and anxiety. Things that can help you relax include: ? Yoga. ? Mind-body activities, such as deep breathing, meditation, or using words and images to create positive thoughts (guided imagery). ? Physical activity, such as swimming, jogging, or walking. Tell your health care provider if your palpitations increase with activity. If you have chest pain or shortness of breath with activity, do not continue the activity until you are seen by your health care provider. ? Biofeedback. This is a method that helps you learn to use your mind to control things in your body, such as your heartbeat.  Do not use drugs, including cocaine or ecstasy. Do not use marijuana.  Get plenty of rest and sleep. Keep a regular bed time. General  instructions  Take over-the-counter and prescription medicines only as told by your health care provider.  Do not use any products that contain nicotine or tobacco, such as cigarettes and e-cigarettes. If you need help quitting, ask your health care provider.  Keep all follow-up visits as told by your health care provider. This is important. These may include visits for further testing if palpitations do not go away or get worse. Contact a health care provider if you:  Continue to have a fast or irregular heartbeat after 24 hours.  Notice that your palpitations occur more often. Get help right away if you:  Have chest pain or shortness of breath.  Have a severe headache.  Feel dizzy or you faint. Summary  Palpitations are feelings that your heartbeat is irregular or is faster than normal. It may feel like your heart is fluttering or skipping a beat.  Palpitations may be caused by many things, including smoking, caffeine, alcohol, stress, certain medicines, and drugs.  Although most causes of  palpitations are not serious, some causes can be a sign of a serious medical problem.  Get help right away if you faint or have chest pain, shortness of breath, a severe headache, or dizziness. This information is not intended to replace advice given to you by your health care provider. Make sure you discuss any questions you have with your health care provider. Document Revised: 10/10/2017 Document Reviewed: 10/10/2017 Elsevier Patient Education  2020 [employer].  Fatigue If you have fatigue, you feel tired all the time and have a lack of energy or a lack of motivation. Fatigue may make it difficult to start or complete tasks because of exhaustion. In general, occasional or mild fatigue is often a normal response to activity or life. However, long-lasting (chronic) or extreme fatigue may be a symptom of a medical condition. Follow these instructions at home: General instructions  Watch  your fatigue for any changes.  Go to bed and get up at the same time every day.  Avoid fatigue by pacing yourself during the day and getting enough sleep at night.  Maintain a healthy weight. Medicines  Take over-the-counter and prescription medicines only as told by your health care provider.  Take a multivitamin, if told by your health care provider.  Do not use herbal or dietary supplements unless they are approved by your health care provider. Activity   Exercise regularly, as told by your health care provider.  Use or practice techniques to help you relax, such as yoga, tai chi, meditation, or massage therapy. Eating and drinking   Avoid heavy meals in the evening.  Eat a well-balanced diet, which includes lean proteins, whole grains, plenty of fruits and vegetables, and low-fat dairy products.  Avoid consuming too much caffeine.  Avoid the use of alcohol.  Drink enough fluid to keep your urine pale yellow. Lifestyle  Change situations that cause you stress. Try to keep your work and personal schedule in balance.  Do not use any products that contain nicotine or tobacco, such as cigarettes and e-cigarettes. If you need help quitting, ask your health care provider.  Do not use drugs. Contact a health care provider if:  Your fatigue does not get better.  You have a fever.  You suddenly lose or gain weight.  You have headaches.  You have trouble falling asleep or sleeping through the night.  You feel angry, guilty, anxious, or sad.  You are unable to have a bowel movement (constipation).  Your skin is dry.  You have swelling in your legs or another part of your body. Get help right away if:  You feel confused.  Your vision is blurry.  You feel faint or you pass out.  You have a severe headache.  You have severe pain in your abdomen, your back, or the area between your waist and hips (pelvis).  You have chest pain, shortness of breath, or an  irregular or fast heartbeat.  You are unable to urinate, or you urinate less than normal.  You have abnormal bleeding, such as bleeding from the rectum, vagina, nose, lungs, or nipples.  You vomit blood.  You have thoughts about hurting yourself or others. If you ever feel like you may hurt yourself or others, or have thoughts about taking your own life, get help right away. You can go to your nearest emergency department or call:  Your local emergency services (911 in the [place]  A suicide crisis helpline, such as the National Suicide Prevention Lifeline at 201-085-6508.  This is open 24 hours a day. Summary  If you have fatigue, you feel tired all the time and have a lack of energy or a lack of motivation.  Fatigue may make it difficult to start or complete tasks because of exhaustion.  Long-lasting (chronic) or extreme fatigue may be a symptom of a medical condition.  Exercise regularly, as told by your health care provider.  Change situations that cause you stress. Try to keep your work and personal schedule in balance. This information is not intended to replace advice given to you by your health care provider. Make sure you discuss any questions you have with your health care provider. Document Revised: 03/19/2019 Document Reviewed: 05/23/2017 Elsevier Patient Education  [employer].    If you have lab work done today you will be contacted with your lab results within the next 2 weeks.  If you have not heard from [place] then please contact us. The fastest way to get your results is to register for My Chart.   IF you received an x-ray today, you will receive an invoice from [facility]. Please contact [facility] at [phone] with questions or concerns regarding your invoice.   IF you received labwork today, you will receive an invoice from [facility]. Please contact [facility] at [phone] with questions or concerns regarding your invoice.   Our  billing staff will not be able to assist you with questions regarding bills from these companies.  You will be contacted with the lab results as soon as they are available. The fastest way to get your results is to activate your My Chart account. Instructions are located on the last page of this paperwork. If you have not heard from [place] regarding the results in 2 weeks, please contact this office.        If you have lab work done today you will be contacted with your lab results within the next 2 weeks.  If you have not heard from [place] then please contact us. The fastest way to get your results is to register for My Chart.   IF you received an x-ray today, you will receive an invoice from [facility]. Please contact [facility] at [phone] with questions or concerns regarding your invoice.   IF you received labwork today, you will receive an invoice from [facility]. Please contact [facility] at [phone] with questions or concerns regarding your invoice.   Our billing staff will not be able to assist you with questions regarding bills from these companies.  You will be contacted with the lab results as soon as they are available. The fastest way to get your results is to activate your My Chart account. Instructions are located on the last page of this paperwork. If you have not heard from [place] regarding the results in 2 weeks, please contact this office.        If you have lab work done today you will be contacted with your lab results within the next 2 weeks.  If you have not heard from [place] then please contact us. The fastest way to get your results is to register for My Chart.   IF you received an x-ray today, you will receive an invoice from [facility]. Please contact [facility] at [phone] with questions or concerns regarding your invoice.   IF you received labwork today, you will receive an invoice from [facility]. Please contact [facility] at  [phone] with questions or concerns regarding your invoice.   Our billing  staff will not be able to assist you with questions regarding bills from these companies.  You will be contacted with the lab results as soon as they are available. The fastest way to get your results is to activate your My Chart account. Instructions are located on the last page of this paperwork. If you have not heard from [place] regarding the results in 2 weeks, please contact this office.

## 2020-02-23 NOTE — Progress Notes (Signed)
Virtual Visit via Video Note  I connected with [name] on 02/23/20 at 6:52 PM by a video enabled telemedicine application D[employer]im[employer] (no link on caregility). and verified that I am speaking with the correct person using two identifiers.   I discussed the limitations, risks, security and privacy concerns of performing an evaluation and management service by telephone and the availability of in person appointments. I also discussed with the patient that there may be a patient responsible charge related to this service. The patient expressed understanding and agreed to proceed, consent obtained  Changed to audio after 34min - connection issues.  Additional 84min.   10 min chart review.   Chief complaint: Chief Complaint  Patient presents with  . Fatigue    has been going on since march. pt reports having a sore throat and nausa. pt reports it gets worse and then better; today its worse. pt reports taking her medication as prescribed.  . Depression    pt reports having bad depression since march.       History of Present Illness: [name] is a 74 y.o. female   Acute on chronic respiratory failure with hypoxia, idiopathic pulmonary fibrosis Improved last visit with less O2 need.  Continued plan follow-up with pulmonary.  Appointment was scheduled yesterday with Dr. Elliot Gault but that was canceled.  It appears her next pulmonary appointment is scheduled for June 28 with Tammy Parrett.  Not needing oxygen anymore. O2 has improved - 94-95%, breathing better. Not needing O2 at night.    Fatigue: Ongoing symptoms as well past few months. Hospitalized March first through March 18, then skilled nursing facility after septic shock, AKI, acute hypoxic respiratory failure with acute on chronic diastolic CHF and idiopathic pulmonary fibrosis. Most recent hemoglobin April 23 was normal at 11.9.  Normal magnesium, CMP at that time as well other than creatinine 1.08 which had improved from  one-point Fatigue had improved, but now notes some more fatigue as day goes on. Has to sit down for a few minutes at a time. Decreased energy, not dyspnea. Some sciatica at times.  No dysuria, no fever, no hematuria.  Sore throat  Past few days. Sore throat at times. Noted more this morning. Hurt to swallow. Better this afternoon, but some soreness.  Some nasal congestion few nights ago. No fever.  No usual allergic rhinitis.  No sick contacts.  No loss of taste/smell. Had 1st covid vaccine 3 days ago. ?possible slight sore throat prior. Some phlegm.   Chronic nausea, diarrhea with IBS Has had to adjust medications previously for her nausea.  Minimal relief with Zofran last visit, chronic symptoms but I did recommend she discuss her symptoms with GI and was referred.  Abdomen was nontender at that visit. nausea had been improving, as well as diarrhea. Nausea is back past few days, minimal diarrhea, only once per day - less today. Has not tried zofran recently.  GI appt 03/31/20.has not tried zofran recently, doe not feel like zofran worked in past. Vomited once a few nights ago - ? Due to food she ate.   Hypertension with peripheral vascular disease, tobacco abuse.  BP was elevated in office 5/14, but she was off medications that day.  Plan for nurse visit for blood pressure recheck, but has not had visit. Virtual visit was requested today. Commended on smoking cessation last visit.Handout on coping with quitting given.  Recommended follow-up with her vascular specialist last visit. Has remained on xarelto. Note from 3/3  noted from vascular - She was originally placed on Xarelto for what was suspected to be a central atheroembolic event for diffuse atherosclerosis of her thoracic and abdominal aorta. No new bleeding. Still having some pain and numbness in left leg - been there since surgery.  Has not scheduled appt with Dr. Oneida Alar yet.  Wanted to meet with different cardiologist than Dr. Gwenlyn Found  (hx of diastolic CHF)   she has name and will let me know.  A few palpitations at times over the past month, no associated near syncope/CP/dyspnea.   Depression: History of major depressive disorder, generalized anxiety disorder under the care of Dr. Clovis Pu with psychiatry, and counseling with Luan Moore. bereavement with loss of her sister susie and husband [name] prior. no intent/plan of SI per triage note Friday.   Still taking meds for depression, but cutting in 1/2 due to nausea. Still suffering from loneliness. Driving again. Going to grocery store. Overdue for follow up with her therapist, has not talked to him in past month.  Has had some fleeting thoughts of suicide when things get tough.  Last week was last episode of suicide thought, when feeling lonely. Plan - in her car - leave the ignition running.  Denies intent to proceed. Similar sx's in past.  Denies active suicidal thoughts at this time. She would like to to her therapist and plans to call him tomorrow.    Patient Active Problem List   Diagnosis Date Noted  . Acute on chronic respiratory failure with hypoxia ([place]) 12/26/2019  . Acute on chronic diastolic heart failure ([place]) 12/26/2019  . Shortness of breath 12/26/2019  . Bilateral pulmonary infiltrates on CXR   . Acute hypoxemic respiratory failure ([place])   . Complicated UTI (urinary tract infection) 11/11/2019  . AKI (acute kidney injury) ([place]) 11/11/2019  . Hyponatremia 11/11/2019  . Elevated LFTs 11/11/2019  . Sepsis ([place]) 11/11/2019  . Wound infection 11/07/2018  . Postoperative pain   . Sleep disturbance   . Tobacco abuse   . Acute blood loss anemia   . Hypoalbuminemia due to protein-calorie malnutrition ([place])   . Debility 10/24/2018  . Pre-operative cardiovascular examination 09/26/2018  . HLD (hyperlipidemia) 09/26/2018  . GAD (generalized anxiety disorder) 08/07/2018  . Major depressive disorder, single episode 08/07/2018  . Appendiceal abscess  07/08/2018  . PVD (peripheral vascular disease) ([place]) 03/15/2018  . Atherosclerosis of native artery of left lower extremity with intermittent claudication ([place]) 03/15/2018  . Chronic left-sided low back pain with left-sided sciatica 12/25/2017  . Facial droop   . History of CVA (cerebrovascular accident) 10/23/2017  . Critical lower limb ischemia 11/08/2016  . Smoker 11/08/2016  . Postinflammatory pulmonary fibrosis ([place]) 02/14/2016  . Cigarette smoker 12/24/2015  . Hypothyroid ? 01/16/2014  . Mild cognitive impairment 01/16/2014  . Meningioma ([place]) 10/21/2013  . Chest pain 10/20/2013  . Medicare annual wellness visit, subsequent 06/16/2013  . Dizziness and giddiness 06/16/2013  . RLS (restless legs syndrome) 04/29/2012  . Abdominal pain 12/27/2010  . DEGENERATIVE JOINT DISEASE 09/23/2010  . CAROTID ARTERY DISEASE 08/15/2010  . CHEST PAIN 08/15/2010  . HEADACHE 12/02/2009  . BACK PAIN 11/22/2009  . CONSTIPATION, CHRONIC 01/29/2008  . NAUSEA 01/28/2008  . DEPRESSION 03/08/2007  . HTN (hypertension) 03/08/2007   Past Medical History:  Diagnosis Date  . AAA (abdominal aortic aneurysm) (HCC)    3.1 cm 07/08/18, 3 year follow-up recommended; possible 3 cm AAA by aortogram 09/13/18  . Anemia    PMH  . Appendicitis  with abscess    07/08/18, s/p perc drain; resolved 07/30/18 by CT  . Arthritis   . Bipolar disorder ([place])   . Cerebrovascular disease    intra and extracranial vascular dx per MRI 4/11, neurology rec strict CVRF control  . Colonic inertia   . Constipation    chronic;severe  . Coronary artery disease   . Depression   . Duodenitis   . EKG abnormalities    changes, stress test neg (false EKG changes)  . Gastritis   . GERD (gastroesophageal reflux disease)   . Hypertension   . Hypothyroid 01/16/2014  . Meningioma ([place]) 10/21/2013  . PAD (peripheral artery disease) ([place])   . Psoriasis    sees derm  . Stroke ([place])   . Wears dentures   . Wears glasses    Past  Surgical History:  Procedure Laterality Date  . ABDOMINAL AORTOGRAM W/LOWER EXTREMITY N/A 09/13/2018   Procedure: ABDOMINAL AORTOGRAM W/LOWER EXTREMITY;  Surgeon: Elam Dutch, MD;  Location: [facility];  Service: Cardiovascular;  Laterality: N/A;  . arthroscopy  04/2010   Right knee  . CATARACT EXTRACTION W/ INTRAOCULAR LENS  IMPLANT, BILATERAL    . COLONOSCOPY    . FEMORAL-POPLITEAL BYPASS GRAFT  10/22/2018  . FEMORAL-POPLITEAL BYPASS GRAFT Left 10/22/2018   Procedure: LEFT FEMORAL TO BELOW THE KNEE POPLITEAL ARTERY BYPASS GRAFT;  Surgeon: Elam Dutch, MD;  Location: [facility];  Service: Vascular;  Laterality: Left;  . I & D EXTREMITY Left 11/08/2018   Procedure: IRRIGATION AND DEBRIDEMENT EXTREMITY Left Leg;  Surgeon: Elam Dutch, MD;  Location: [facility];  Service: Vascular;  Laterality: Left;  . IR RADIOLOGIST EVAL & MGMT  07/30/2018  . MULTIPLE TOOTH EXTRACTIONS    . TUBAL LIGATION     No Known Allergies Prior to Admission medications   Medication Sig Start Date End Date Taking? Authorizing Provider  amLODipine (NORVASC) 2.5 MG tablet Take 1 tablet (2.5 mg total) by mouth daily. 11/28/19  Yes Gherghe, Vella Redhead, MD  clonazePAM (KLONOPIN) 0.5 MG tablet Take 1 tablet (0.5 mg total) by mouth 2 (two) times daily as needed for anxiety. TAKE 1 TABLET BY MOUTH AT BEDTIME FOR ANXIETY 11/27/19  Yes Gherghe, Vella Redhead, MD  DULoxetine (CYMBALTA) 30 MG capsule Take 1 capsule (30 mg total) by mouth every evening. Patient taking differently: Take 30 mg by mouth 2 (two) times daily.  04/15/19  Yes Cottle, Billey Co., MD  famotidine (PEPCID) 20 MG tablet One at bedtime Patient taking differently: Take 20 mg by mouth at bedtime. One at bedtime 02/14/16  Yes Tanda Rockers, MD  lamoTRIgine (LAMICTAL) 150 MG tablet TAKE 1 TABLET(150 MG) BY MOUTH TWICE DAILY Patient taking differently: Take 75 mg by mouth 2 (two) times daily.  05/23/19  Yes Cottle, Billey Co., MD  linaclotide [facility]) 145 MCG  CAPS capsule Take 1 capsule (145 mcg total) by mouth daily before breakfast. 05/14/19  Yes Willia Craze, NP  Melatonin 10 MG TABS Take 20 mg by mouth at bedtime.   Yes [provider]  ondansetron (ZOFRAN ODT) 4 MG disintegrating tablet Take 1 tablet (4 mg total) by mouth every 8 (eight) hours as needed for nausea or vomiting. 12/26/19  Yes Wendall Mola, NP  polyethylene glycol powder (GLYCOLAX/MIRALAX) powder Take 17 g by mouth daily as needed for moderate constipation.    Yes [provider]  rivaroxaban (XARELTO) 20 MG TABS tablet Take 1 tablet (20 mg total)  by mouth daily with supper. 10/02/19  Yes Fields, Jessy Oto, MD  rOPINIRole (REQUIP) 2 MG tablet TAKE 1 AND 1/2 TABLETS(3 MG) BY MOUTH AT BEDTIME Patient taking differently: Take 3 mg by mouth at bedtime.  10/31/19  Yes Cottle, Billey Co., MD   Social History   Socioeconomic History  . Marital status: Widowed    Spouse name: Not on file  . Number of children: 1  . Years of education: Not on file  . Highest education level: Not on file  Occupational History  . Occupation: retired  Tobacco Use  . Smoking status: Former Smoker    Packs/day: 0.75    Years: 60.00    Pack years: 45.00    Types: Cigarettes  . Smokeless tobacco: Never Used  Vaping Use  . Vaping Use: Former  Substance and Sexual Activity  . Alcohol use: Yes    Alcohol/week: 0.0 standard drinks    Comment: yes on occassion  . Drug use: No  . Sexual activity: Not Currently  Other Topics Concern  . Not on file  Social History Narrative   Brother in law Mr Rexford Maus (one of my patients)   Lives w/ husband       Social Determinants of Health   Financial Resource Strain:   . Difficulty of Paying Living Expenses:   Food Insecurity:   . Worried About Charity fundraiser in the Last Year:   . Arboriculturist in the Last Year:   Transportation Needs:   . Film/video editor (Medical):   Marland Kitchen Lack of Transportation (Non-Medical):    Physical Activity:   . Days of Exercise per Week:   . Minutes of Exercise per Session:   Stress:   . Feeling of Stress :   Social Connections:   . Frequency of Communication with Friends and Family:   . Frequency of Social Gatherings with Friends and Family:   . Attends Religious Services:   . Active Member of Clubs or Organizations:   . Attends Archivist Meetings:   Marland Kitchen Marital Status:   Intimate Partner Violence:   . Fear of Current or Ex-Partner:   . Emotionally Abused:   Marland Kitchen Physically Abused:   . Sexually Abused:     Observations/Objective: Vitals:   02/23/20 1618  BP: 139/73  Weight: 162 lb (73.5 kg)  Height: 5\' 8"  (1.727 m)  No distress over video, appropriate responses with good eye contact on history.  Denies active suicidal ideation.  Speaking in full sentences without respiratory distress, nontoxic appearing.  Speaking without difficulty.  No apparent hoarseness.   Assessment and Plan: Fatigue, unspecified type - Plan: CBC, Basic metabolic panel, TSH  -Suspect multifactorial.  Depression, idiopathic pulmonary fibrosis although reports improved breathing.  Check CBC, BMP, TSH tomorrow with previous AKI, and on Xarelto.  ER precautions given.  Nausea Diarrhea, unspecified type  -Chronic intermittent nausea, diarrhea, had been improving with some recurrence recently.  Has not had relief with Zofran previously but recommended trial again.  Part of nausea could be related to postnasal drip/congestion, saline nasal spray discussed, repeat attempt at  Zofran, and to call GI to see if earlier appointment available if persistent symptoms.  Unfortunately this is also limiting her amount of medication including psychotropics.  PVD (peripheral vascular disease) ([place])  -Follow-up discussed with her vascular specialist, continued on Xarelto. Denies new bleeding.   Idiopathic pulmonary fibrosis ([place])  - improving. Denies O2 need at this time.  Acute  on chronic diastolic  heart failure [facility])  - requests new cardiology follow up.   Major depressive disorder, remission status unspecified, unspecified whether recurrent  -Recurrent, severe depression with previous suicidal ideation.  Unfortunately has had some limitation in her medication due to nausea as above.  Followed by psychiatry, and therapist.   Denies suicidal intent, denies active suicidal ideation at this time.  Does have interest in meeting with her therapist and plans on calling him tomorrow.  ER/911/suicide hotline precautions were given with understanding expressed.  sore throat - Plan: CBC  -Recent symptoms, possibly from Covid vaccine, office eval in the next 48 hours if not improving.  ER/RTC precautions sooner if worsening.  Palpitations - Plan: TSH  -Intermittent, will check TSH, CBC, and office evaluation within the next 1 week, ER precautions given  sooner.   Follow Up Instructions:  1 week and labs tomorrow.    I discussed the assessment and treatment plan with the patient. The patient was provided an opportunity to ask questions and all were answered. The patient agreed with the plan and demonstrated an understanding of the instructions.   The patient was advised to call back or seek an in-person evaluation if the symptoms worsen or if the condition fails to improve as anticipated.  I provided 40 minutes of non-face-to-face time during this encounter.   [name], MD

## 2020-02-23 NOTE — Telephone Encounter (Signed)
Pt apologized for missing your calls and states she must not have heard the phone ring. Are you wanting to reschedule her virtual for today? Or another day this week?

## 2020-02-24 ENCOUNTER — Telehealth: Payer: Self-pay | Admitting: Family Medicine

## 2020-02-24 NOTE — Telephone Encounter (Signed)
Pt called back to sch those labs and she wants the orders sent to [facility] on [address] near the hospital. Pt stated she will call back to sch the 1 week f/u  [employer] pt is wanting: [address], [place], [place] [address]

## 2020-02-24 NOTE — Telephone Encounter (Signed)
02/24/2020 - PATIENT HAD A VIRTUAL OFFICE VISIT WITH DR. Carlota Raspberry ON Monday (02/23/2020). DR. Carlota Raspberry HAS REQUESTED SHE RETURN ON Tuesday (02/24/2020) FOR A NURSE'S VISIT ONLY. HE ALSO WANTS TO SEE HER IN THE OFFICE IN 1 WEEK. I TRIED TO CALL AND SCHEDULE BUT HAD TO LEAVE A VOICE MAIL TO RETURN MY CALL. [place]

## 2020-02-24 NOTE — Telephone Encounter (Signed)
Spoke to patient and she will go to [facility] to have blood drawn. Address was given to patient and I repeated it, too.

## 2020-02-25 ENCOUNTER — Telehealth: Payer: Self-pay | Admitting: Gastroenterology

## 2020-02-25 ENCOUNTER — Encounter: Payer: Self-pay | Admitting: Family Medicine

## 2020-02-25 NOTE — Telephone Encounter (Signed)
Pt called requesting an appt asap. She is now scheduled for 7/21 with Dr. Havery Moros but she states that she cannot wait that long. She reports that she has very bad nausea and diarrhea. Pls call her.

## 2020-02-25 NOTE — Telephone Encounter (Signed)
Dr. Lyman Bishop [place] [phone]   Pt wishes to see this cardiologist per a recommendation. Can we resend it here?

## 2020-02-25 NOTE — Telephone Encounter (Signed)
Patient advised that no earlier appointments at this time.  She is advised that I will add her to the cancellation list.

## 2020-02-27 ENCOUNTER — Ambulatory Visit (INDEPENDENT_AMBULATORY_CARE_PROVIDER_SITE_OTHER): Payer: Medicare Other | Admitting: Psychiatry

## 2020-02-27 ENCOUNTER — Telehealth: Payer: Self-pay | Admitting: Psychiatry

## 2020-02-27 DIAGNOSIS — F331 Major depressive disorder, recurrent, moderate: Secondary | ICD-10-CM

## 2020-02-27 DIAGNOSIS — Z8673 Personal history of transient ischemic attack (TIA), and cerebral infarction without residual deficits: Secondary | ICD-10-CM | POA: Diagnosis not present

## 2020-02-27 DIAGNOSIS — Z8619 Personal history of other infectious and parasitic diseases: Secondary | ICD-10-CM | POA: Diagnosis not present

## 2020-02-27 DIAGNOSIS — Z634 Disappearance and death of family member: Secondary | ICD-10-CM

## 2020-02-27 DIAGNOSIS — G3184 Mild cognitive impairment, so stated: Secondary | ICD-10-CM | POA: Diagnosis not present

## 2020-02-27 NOTE — Progress Notes (Signed)
Psychotherapy Progress Note [facility] Luan Moore, PhD LP  Patient ID: [name]     MRN: [id number] Therapy format: Individual psychotherapy Date: 02/27/2020      Start: 3:15p     Stop: 4:05p     Time Spent: 50 min Location: Telehealth visit -- I connected with this patient by an approved telecommunication method (video), with her informed consent, and verifying identity and patient privacy.  I was located at my office and patient at her home.  As needed, we discussed the limitations, risks, and security and privacy concerns associated with telehealth service, including the availability and conditions which currently govern in-person appointments and the possibility that 3rd-party payment may not be fully guaranteed and she may be responsible for charges.  After she indicated understanding, we proceeded with the session.  Also discussed treatment planning, as needed, including ongoing verbal agreement with the plan, the opportunity to ask and answer all questions, her demonstrated understanding of instructions, and her readiness to call the office should symptoms worsen or she feels she is in a crisis state and needs more immediate and tangible assistance.   Session narrative (presenting needs, interim history, self-report of stressors and symptoms, applications of prior therapy, status changes, and interventions made in session) Half-enraged at herself for intractable habit of staying up to the wee hours and sleeping long into the day.  Self-critical about her appearance, energy.  Becoming aware she is in longterm recovery from sepsis, energy zapped, feels 89, not 74.  Son [name] keeps reminding her how bad a person she is, she says.  She wants to visit an old friend [name] in [place], asked [name] if she could stay with them and got an earful about being negative, hateful.  Reframed as highly motivated to exaggerate by his own dysfunctions.  Wants to clean up her record in  life, make peace where needed before death takes her (e.g., cardiovascular dz, kidney dz).  Forecast for her finances is that she has 9 years of money left, doesn't expect to live that long.  [name] has threatened to take over, which alarms her.  Educated on POA vs. guardianship and her actual rights.  Story of having to pay out for breaking her home sale contract and fend off attempt to collect $24K in dubious fees.  Evaluated worries for further costs, seems unlikely.  Finding her spirituality hard to recover -- out of fellowship for a long time, used to feel relationship with God outside at her house but now feels numbed.  Been in touch with sister Suzie through a medium, which is reassuring some.  Has message she wants Pt to turn from suffering and try to enjoy what is.  But unsettled still.  Took opportunity in session to stop and do mindful listening, other senses while standing outside.  Successfully calmed, quieted, and restored sense of being able to connect spiritually.  Therapeutic modalities: Cognitive Behavioral Therapy, Solution-Oriented/Positive Psychology, Water engineer  Mental Status/Observations:  Appearance:   Casual     Behavior:  Appropriate  Motor:  Normal  Speech/Language:   Clear and Coherent  Affect:  Appropriate  Mood:  anxious and dysthymic  Thought process:  normal  Thought content:    WNL  Sensory/Perceptual disturbances:    WNL  Orientation:  Fully oriented  Attention:  Good    Concentration:  Good  Memory:  WNL  Insight:    Fair  Judgment:   Good  Impulse Control:  Good  Risk Assessment: Danger to Self: No Self-injurious Behavior: No Danger to Others: No Physical Aggression / Violence: No Duty to Warn: No Access to Firearms a concern: No  Assessment of progress:  progressing  Diagnosis:   ICD-10-CM   1. Major depressive disorder, recurrent episode, moderate (HCC)  F33.1   2. Mild cognitive impairment  G31.84   3.  Bereavement  Z63.4   4. History of CVA (cerebrovascular accident)  Z86.73   5. History of sepsis (recent, in long-term recovery)  Z86.19    Plan:  . Practice mindful listening ad lib . Self-affirm that son's threats and any new legal or realty claims are baseless . Other recommendations/advice as may be noted above . Continue to utilize previously learned skills ad lib . Maintain medication as prescribed and work faithfully with relevant prescriber(s) if any changes are desired or seem indicated . Call the clinic on-call service, present to ER, or call 911 if any life-threatening psychiatric crisis Return for time as available, will call. . Already scheduled visit in this office 04/26/2020.  Blanchie Serve, PhD Luan Moore, PhD LP Clinical Psychologist, [facility] [address], [place], [place] [address] [phone]

## 2020-02-27 NOTE — Telephone Encounter (Signed)
[name], [name] are scheduled for a virtual visit with your provider today.    Just as we do with appointments in the office, we must obtain your consent to participate.  Your consent will be active for this visit and any virtual visit you may have with one of our providers in the next 365 days.    If you have a MyChart account, I can also send a copy of this consent to you electronically.  All virtual visits are billed to your insurance company just like a traditional visit in the office.  As this is a virtual visit, video technology does not allow for your provider to perform a traditional examination.  This may limit your provider's ability to fully assess your condition.  If your provider identifies any concerns that need to be evaluated in person or the need to arrange testing such as labs, EKG, etc, we will make arrangements to do so.    Although advances in technology are sophisticated, we cannot ensure that it will always work on either your end or our end.  If the connection with a video visit is poor, we may have to switch to a telephone visit.  With either a video or telephone visit, we are not always able to ensure that we have a secure connection.   I need to obtain your verbal consent now.   Are you willing to proceed with your visit today?   [name] has provided verbal consent on 02/27/2020 for a virtual visit (video or telephone).   Blanchie Serve, PhD 02/27/2020  3:22 PM

## 2020-03-01 ENCOUNTER — Telehealth: Payer: Self-pay | Admitting: *Deleted

## 2020-03-01 NOTE — Telephone Encounter (Signed)
SCHEDULE AWV 

## 2020-03-02 ENCOUNTER — Other Ambulatory Visit: Payer: Self-pay | Admitting: Psychiatry

## 2020-03-03 NOTE — Telephone Encounter (Signed)
Not sure why they have the MD listed but last refill was by you on 02/06/20 Scheduled back in August. Refills?

## 2020-03-08 ENCOUNTER — Ambulatory Visit: Payer: Medicare Other | Admitting: Adult Health

## 2020-03-11 ENCOUNTER — Encounter: Payer: Self-pay | Admitting: Family Medicine

## 2020-03-12 ENCOUNTER — Telehealth: Payer: Self-pay | Admitting: Family Medicine

## 2020-03-12 DIAGNOSIS — Z23 Encounter for immunization: Secondary | ICD-10-CM | POA: Diagnosis not present

## 2020-03-12 DIAGNOSIS — R5383 Other fatigue: Secondary | ICD-10-CM | POA: Diagnosis not present

## 2020-03-12 DIAGNOSIS — R002 Palpitations: Secondary | ICD-10-CM | POA: Diagnosis not present

## 2020-03-12 NOTE — Telephone Encounter (Signed)
Pt sent request for Medical Records which I have printed.  Pt is being sued by her real estate agent b/c she was unable to move out of her home on time. This was at the time when the pt was ill. Pt would like you to write a cover letter of sorts for her court date on July 8th 2021, stating she was seriously ill at that point and following her hospital visit.

## 2020-03-15 ENCOUNTER — Encounter: Payer: Self-pay | Admitting: Family Medicine

## 2020-03-15 NOTE — Telephone Encounter (Signed)
Letter was completed and sent to her MyChart account.  I would not be able to sign that until I am back in the office on Thursday morning unless that is needed sooner, then can be cosigned by one of my colleagues.  Let me know if I can help further.

## 2020-03-16 ENCOUNTER — Telehealth: Payer: Self-pay

## 2020-03-16 NOTE — Telephone Encounter (Signed)
Called to inform patient that I had mistakenly told her Dr. Carlota Raspberry would be in office on 03/17/2020 and that if she needs her letter signed before 03/18/2020 she would need to come in today or tomorrow 03/17/2020 and have it signed by another provider as he is out of office.

## 2020-03-17 ENCOUNTER — Ambulatory Visit: Payer: Medicare Other | Admitting: Primary Care

## 2020-03-31 ENCOUNTER — Other Ambulatory Visit: Payer: Self-pay | Admitting: Psychiatry

## 2020-03-31 ENCOUNTER — Ambulatory Visit (INDEPENDENT_AMBULATORY_CARE_PROVIDER_SITE_OTHER): Payer: Medicare Other | Admitting: Gastroenterology

## 2020-03-31 ENCOUNTER — Encounter: Payer: Self-pay | Admitting: Gastroenterology

## 2020-03-31 VITALS — BP 130/82 | HR 70

## 2020-03-31 DIAGNOSIS — R630 Anorexia: Secondary | ICD-10-CM | POA: Diagnosis not present

## 2020-03-31 DIAGNOSIS — R112 Nausea with vomiting, unspecified: Secondary | ICD-10-CM | POA: Diagnosis not present

## 2020-03-31 DIAGNOSIS — R194 Change in bowel habit: Secondary | ICD-10-CM | POA: Diagnosis not present

## 2020-03-31 DIAGNOSIS — R140 Abdominal distension (gaseous): Secondary | ICD-10-CM | POA: Diagnosis not present

## 2020-03-31 MED ORDER — RIFAXIMIN 550 MG PO TABS: 550.0000 mg | ORAL_TABLET | Freq: Three times a day (TID) | ORAL | 0 refills | Status: AC

## 2020-03-31 NOTE — Patient Instructions (Addendum)
If you are age 76 or older, your body mass index should be between 23-30. Your There is no height or weight on file to calculate BMI. If this is out of the aforementioned range listed, please consider follow up with your Primary Care Provider.  If you are age 52 or younger, your body mass index should be between 19-25. Your There is no height or weight on file to calculate BMI. If this is out of the aformentioned range listed, please consider follow up with your Primary Care Provider.    You have been scheduled for an endoscopy. Please follow written instructions given to you at your visit today.  You can  If you use inhalers (even only as needed), please bring them with you on the day of your procedure.  Take Pepcid 20 mg every day.  We are giving you samples of Xifaxan 550 mg:  Please take one tablet three times a day for 14 days.  Lot #[id number], Exp: 06-2021   Thank you for entrusting me with your care and for choosing [facility], Dr. Porter Cellar

## 2020-03-31 NOTE — Progress Notes (Signed)
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  She has had longstanding constipation historically, she has failed multiple laxatives previously.  We had placed her on Linzess in 2019 and she had been doing quite well on that regimen.  She states for the past year or so her bowels have changed significantly.  She no longer has any constipation, in fact she has loose stools with post prandial urgency and significant gas and bloating that bothers her.  She has stopped the Linzess, she has not really been taking much of anything for her symptoms however.  She has about 2 bowel movements per day, usually on the looser side, with some gas and bloating.  She denies any blood in her stools.  Symptoms have persisted over time.  No fevers or alarm symptoms.  Changes in her medical history from the last time I seen her is that she was admitted in March for septic shock due to pyelonephritis and E. coli bacteremia.  She developed ATN and acute kidney injury during her hospitalization.  She also developed acute hypoxic respiratory failure with some diastolic CHF during her hospitalization.  She states she has recovered from this.  She denies any cardiopulmonary symptoms.  She is not using any supplemental oxygen is doing better in regards to her respiratory status.  The other main issue she has been dealing with has been chronic nausea with occasional vomiting.  She states she has had chronic nausea for years.  She thinks has been worsening lately, now has been having occasional vomiting due to the symptoms.  She is not been eating too well.  She states she takes Pepcid and that really helps her symptoms however does not take it too much.  She had been using Zofran but that does not help anymore at all.  Interestingly she denies any symptoms of reflux that  bother her.  She had an EGD in 2012 which showed gastritis.   Of note she had an attempt at colonoscopy in May 2009 which was not successful. A barium enema at that time showed that barium could not be refluxed beyond the sigmoid colon. A virtual colonoscopy in February 2011 showed a severely tortuous colon.  She had a surgical consultation by Dr. Zella Richer in 2011 for consideration of partial colectomy, as well as by a surgeon in [place] at that time, but she did not have this done.   She has not had colon cancer screening since that time.  She has had 2 CT scans of her abdomen and pelvis this year which did not show any concerning findings with her colon.   Echo 11/18/2019 - 70-75%   CT scan 11/11/2019 - IMPRESSION: 1. No acute findings are noted in the abdomen or pelvis. Specifically, no unexpected perinephric fluid collections or other signs of complicated pyelonephritis. 2. Pulmonary fibrosis in the visualize lung bases, similar to prior studies. 3. Aortic atherosclerosis, as well as multi-vessel coronary artery disease and ectasia of the infrarenal abdominal aorta (mean diameter of 2.8 cm). 4. Additional incidental findings, as above.     Past Medical History:  Diagnosis Date  . AAA (abdominal aortic aneurysm) (HCC)    3.1 cm 07/08/18, 3 year follow-up recommended; possible 3 cm AAA by aortogram 09/13/18  . Anemia    PMH  . Appendicitis with abscess    07/08/18, s/p  perc drain; resolved 07/30/18 by CT  . Arthritis   . Bipolar disorder ([place])   . Cerebrovascular disease    intra and extracranial vascular dx per MRI 4/11, neurology rec strict CVRF control  . Colonic inertia   . Constipation    chronic;severe  . Coronary artery disease   . Depression   . Duodenitis   . EKG abnormalities    changes, stress test neg (false EKG changes)  . Gastritis   . GERD (gastroesophageal reflux disease)   . Hypertension   . Hypothyroid 01/16/2014  . Meningioma ([place]) 10/21/2013  . PAD  (peripheral artery disease) ([place])   . Psoriasis    sees derm  . Stroke ([place])   . Wears dentures   . Wears glasses      Past Surgical History:  Procedure Laterality Date  . ABDOMINAL AORTOGRAM W/LOWER EXTREMITY N/A 09/13/2018   Procedure: ABDOMINAL AORTOGRAM W/LOWER EXTREMITY;  Surgeon: Elam Dutch, MD;  Location: [facility];  Service: Cardiovascular;  Laterality: N/A;  . arthroscopy  04/2010   Right knee  . CATARACT EXTRACTION W/ INTRAOCULAR LENS  IMPLANT, BILATERAL    . COLONOSCOPY    . FEMORAL-POPLITEAL BYPASS GRAFT  10/22/2018  . FEMORAL-POPLITEAL BYPASS GRAFT Left 10/22/2018   Procedure: LEFT FEMORAL TO BELOW THE KNEE POPLITEAL ARTERY BYPASS GRAFT;  Surgeon: Elam Dutch, MD;  Location: [facility];  Service: Vascular;  Laterality: Left;  . I & D EXTREMITY Left 11/08/2018   Procedure: IRRIGATION AND DEBRIDEMENT EXTREMITY Left Leg;  Surgeon: Elam Dutch, MD;  Location: [facility];  Service: Vascular;  Laterality: Left;  . IR RADIOLOGIST EVAL & MGMT  07/30/2018  . MULTIPLE TOOTH EXTRACTIONS    . TUBAL LIGATION     Family History  Problem Relation Age of Onset  . Breast cancer Mother        metastisis to bones  . Diabetes Son   . Heart disease Other        grandfather   . Alcohol abuse Brother   . Heart disease Brother   . Heart disease Maternal Aunt   . Lung cancer Brother        smoked  . Colon cancer Neg Hx    Social History   Tobacco Use  . Smoking status: Former Smoker    Packs/day: 0.75    Years: 60.00    Pack years: 45.00    Types: Cigarettes  . Smokeless tobacco: Never Used  Vaping Use  . Vaping Use: Former  Substance Use Topics  . Alcohol use: Yes    Alcohol/week: 0.0 standard drinks    Comment: yes on occassion  . Drug use: No   Current Outpatient Medications  Medication Sig Dispense Refill  . amLODipine (NORVASC) 2.5 MG tablet Take 1 tablet (2.5 mg total) by mouth daily.    . clonazePAM (KLONOPIN) 0.5 MG tablet TAKE 1 TABLET BY MOUTH AT  BEDTIME AS NEEDED FOR ANXIETY 90 tablet 1  . DULoxetine (CYMBALTA) 30 MG capsule Take 1 capsule (30 mg total) by mouth every evening. 90 capsule 1  . famotidine (PEPCID) 20 MG tablet One at bedtime 30 tablet 11  . Melatonin 10 MG TABS Take 20 mg by mouth at bedtime.    . rivaroxaban (XARELTO) 20 MG TABS tablet Take 1 tablet (20 mg total) by mouth daily with supper. 30 tablet 11  . rOPINIRole (REQUIP) 2 MG tablet TAKE 1 AND 1/2 TABLETS(3 MG) BY MOUTH AT BEDTIME 135 tablet 0  No current facility-administered medications for this visit.   No Known Allergies   Review of Systems: All systems reviewed and negative except where noted in HPI.   Lab Results  Component Value Date   WBC 8.5 01/02/2020   HGB 11.9 01/02/2020   HCT 36.4 01/02/2020   MCV 90 01/02/2020   PLT 370 01/02/2020    Lab Results  Component Value Date   CREATININE 1.08 (H) 01/02/2020   BUN 17 01/02/2020   NA 135 01/02/2020   K 4.5 01/02/2020   CL 98 01/02/2020   CO2 25 01/02/2020    Lab Results  Component Value Date   ALT 12 01/02/2020   AST 15 01/02/2020   ALKPHOS 87 01/02/2020   BILITOT 0.2 01/02/2020     Physical Exam: BP 130/82 (BP Location: Left Arm, Patient Position: Sitting, Cuff Size: Normal)   Pulse 70   SpO2 99%  Constitutional: Pleasant,well-developed, female in no acute distress. HEENT: Normocephalic and atraumatic. Conjunctivae are normal. No scleral icterus. Neck supple.  Cardiovascular: Normal rate, regular rhythm.  Pulmonary/chest: Effort normal and breath sounds normal. No wheezing, rales or rhonchi. Abdominal: Soft, nondistended, nontender.  There are no masses palpable.  Extremities: no edema Lymphadenopathy: No cervical adenopathy noted. Neurological: Alert and oriented to person place and time. Skin: Skin is warm and dry. No rashes noted. Psychiatric: Normal mood and affect. Behavior is normal.   ASSESSMENT AND PLAN: 74 year old female here for reassessment the  following:  Nausea and vomiting - as outlined above, chronic nausea which is worse, now with occasional vomiting and poor p.o. intake.  Symptoms seem to be persistent over time, now not responsive to Zofran.  Interestingly she does find benefit from Pepcid but has not been taking it, however also declined any reflux symptoms as well.  If she has benefit from antacid recommend resuming it, she wants to take Pepcid rather than try PPI at this time.  I otherwise reviewed her CT scan and labs which look okay, but offered her an upper endoscopy to further evaluate the symptoms in regards to her vomiting and poor p.o. intake.  I discussed risks and benefits of anesthesia and endoscopy and she want to proceed.  I think okay to continue her Xarelto during the periprocedural timeframe for this diagnostic exam.  She agreed.  Change in bowel habits - interestingly has had chronic constipation historically and now with intermittent loose stools with significant gas and bloating relieved with a bowel movement.  She has stopped her Linzess.  Her labs are normal and she has had no colonic inflammation on 2 CT scans this year.  Discussed options with her.  We had a sample of rifaximin, give her an empiric 2-week course of this in light of her significant gas and bloating and see if that helps, perhaps she had a functional change in her bowel.  If no benefit I asked her to contact me for further advice, she wants to avoid colonoscopy if possible given her history of incomplete exams  Lafayette Cellar, MD [facility]

## 2020-04-07 ENCOUNTER — Encounter: Payer: Self-pay | Admitting: Gastroenterology

## 2020-04-07 ENCOUNTER — Other Ambulatory Visit: Payer: Self-pay

## 2020-04-07 ENCOUNTER — Ambulatory Visit (AMBULATORY_SURGERY_CENTER): Payer: Medicare Other | Admitting: Gastroenterology

## 2020-04-07 VITALS — BP 114/58 | HR 67 | Temp 97.3°F | Resp 18 | Ht 68.0 in | Wt 162.0 lb

## 2020-04-07 DIAGNOSIS — K449 Diaphragmatic hernia without obstruction or gangrene: Secondary | ICD-10-CM

## 2020-04-07 DIAGNOSIS — R112 Nausea with vomiting, unspecified: Secondary | ICD-10-CM

## 2020-04-07 DIAGNOSIS — K2950 Unspecified chronic gastritis without bleeding: Secondary | ICD-10-CM | POA: Diagnosis not present

## 2020-04-07 DIAGNOSIS — K2970 Gastritis, unspecified, without bleeding: Secondary | ICD-10-CM

## 2020-04-07 MED ORDER — SODIUM CHLORIDE 0.9 % IV SOLN: 500.0000 mL | Freq: Once | INTRAVENOUS | Status: DC

## 2020-04-07 NOTE — Progress Notes (Signed)
Pt. Calm and speaking intelligibly.  Asking what a hiatal hernia is.  Pt. Given some water with ice.  Cough subsiding.  Pt. Reports frontal H/A, and that she needs some Aleve.

## 2020-04-07 NOTE — Progress Notes (Signed)
Pt. Wants ice.  Reviewed all D/C instructions with patient.  Pt. Did not want to look at the items reviewed with her.  Told pt. To resume Xarelto today.  She did glance at the 24 hr. Urgent number.  Pt. Not ready for her dentures yet.  Discharge delayed until pt. Alert more stable with her emotional demeanor.  Pt. Not crying now.

## 2020-04-07 NOTE — Patient Instructions (Signed)
Impression/Recommendations:  Hiatal hernia handout given to patient.  Resume previous diet. Continue present medications.  Resume Xarelto today.  Take pepcid daily if that helps symptoms.  Await pathology results.  YOU HAD AN ENDOSCOPIC PROCEDURE TODAY AT [facility]:   Refer to the procedure report that was given to you for any specific questions about what was found during the examination.  If the procedure report does not answer your questions, please call your gastroenterologist to clarify.  If you requested that your care partner not be given the details of your procedure findings, then the procedure report has been included in a sealed envelope for you to review at your convenience later.  YOU SHOULD EXPECT: Some feelings of bloating in the abdomen. Passage of more gas than usual.  Walking can help get rid of the air that was put into your GI tract during the procedure and reduce the bloating. If you had a lower endoscopy (such as a colonoscopy or flexible sigmoidoscopy) you may notice spotting of blood in your stool or on the toilet paper. If you underwent a bowel prep for your procedure, you may not have a normal bowel movement for a few days.  Please Note:  You might notice some irritation and congestion in your nose or some drainage.  This is from the oxygen used during your procedure.  There is no need for concern and it should clear up in a day or so.  SYMPTOMS TO REPORT IMMEDIATELY:  Following upper endoscopy (EGD)  Vomiting of blood or coffee ground material  New chest pain or pain under the shoulder blades  Painful or persistently difficult swallowing  New shortness of breath  Fever of 100F or higher  Black, tarry-looking stools  For urgent or emergent issues, a gastroenterologist can be reached at any hour by calling [phone]. Do not use MyChart messaging for urgent concerns.    DIET:  We do recommend a small meal at first, but then you may  proceed to your regular diet.  Drink plenty of fluids but you should avoid alcoholic beverages for 24 hours.  ACTIVITY:  You should plan to take it easy for the rest of today and you should NOT DRIVE or use heavy machinery until tomorrow (because of the sedation medicines used during the test).    FOLLOW UP: Our staff will call the number listed on your records 48-72 hours following your procedure to check on you and address any questions or concerns that you may have regarding the information given to you following your procedure. If we do not reach you, we will leave a message.  We will attempt to reach you two times.  During this call, we will ask if you have developed any symptoms of COVID 19. If you develop any symptoms (ie: fever, flu-like symptoms, shortness of breath, cough etc.) before then, please call [phone].  If you test positive for Covid 19 in the 2 weeks post procedure, please call and report this information to [place].    If any biopsies were taken you will be contacted by phone or by letter within the next 1-3 weeks.  Please call us at [phone] if you have not heard about the biopsies in 3 weeks.    SIGNATURES/CONFIDENTIALITY: You and/or your care partner have signed paperwork which will be entered into your electronic medical record.  These signatures attest to the fact that that the information above on your After Visit Summary has been reviewed and is  understood.  Full responsibility of the confidentiality of this discharge information lies with you and/or your care-partner. 

## 2020-04-07 NOTE — Progress Notes (Signed)
Pt. Asking for sodium penathol, and wondering what time it is. Coughing.  Skin warm and dry, color normal.

## 2020-04-07 NOTE — Progress Notes (Signed)
Pt. Responding appropriately to some interactions, (I.e. taking her IV out), but does not open eyes, crying for her mother and saying her sister dies, and why is she still here.  Pt. Says her throats hurts.  Pt. Coughing.

## 2020-04-07 NOTE — Progress Notes (Signed)
pt tolerated well. VSS. awake and to recovery. Report given to RN. Bite block inserted and removed without trauma. 

## 2020-04-07 NOTE — Progress Notes (Signed)
Upon discharge pt. Thanking staff for "putting up with her during her bad trip."  Pt. Awake, alert, not coughing.  Dr. Havery Moros came back by the RR to discuss findings again with pt. When she was alert and receptive.

## 2020-04-07 NOTE — Op Note (Signed)
[facility] Patient Name: [name] Procedure Date: 04/07/2020 10:16 AM MRN: [id number] Endoscopist: Remo Lipps P. Havery Moros , MD Age: 74 Referring MD:  Date of Birth: [birth year] Gender: Female Account #: [id number] Procedure:                Upper GI endoscopy Indications:              Nausea with vomiting, Zofran not helping however                            patient does endorse benefit from Pepcid but has                            not been taking it routinely, Medicines:                Monitored Anesthesia Care Procedure:                Pre-Anesthesia Assessment:                           - Prior to the procedure, a History and Physical                            was performed, and patient medications and                            allergies were reviewed. The patient's tolerance of                            previous anesthesia was also reviewed. The risks                            and benefits of the procedure and the sedation                            options and risks were discussed with the patient.                            All questions were answered, and informed consent                            was obtained. Prior Anticoagulants: The patient has                            taken Xarelto (rivaroxaban), last dose was 1 day                            prior to procedure. ASA Grade Assessment: III - A                            patient with severe systemic disease. After                            reviewing the risks and benefits, the patient was  deemed in satisfactory condition to undergo the                            procedure.                           After obtaining informed consent, the endoscope was                            passed under direct vision. Throughout the                            procedure, the patient's blood pressure, pulse, and                            oxygen saturations were monitored continuously. The                             Endoscope was introduced through the mouth, and                            advanced to the second part of duodenum. The upper                            GI endoscopy was accomplished without difficulty.                            The patient tolerated the procedure well. Scope In: Scope Out: Findings:                 Esophagogastric landmarks were identified: the                            Z-line was found at 37 cm, the gastroesophageal                            junction was found at 37 cm and the upper extent of                            the gastric folds was found at 39 cm from the                            incisors.                           A 2 cm hiatal hernia was present.                           The exam of the esophagus was otherwise normal.                           Retained fluid was found in the gastric body and in                            the  gastric antrum. This was suctioned / lavaged                            and was cleared.                           The exam of the stomach was otherwise normal.                            Pylorus was patent.                           Biopsies were taken with a cold forceps in the                            gastric body and in the gastric antrum for                            Helicobacter pylori testing.                           The duodenal bulb and second portion of the                            duodenum were normal. Complications:            No immediate complications. Estimated blood loss:                            Minimal. Estimated Blood Loss:     Estimated blood loss was minimal. Impression:               - Esophagogastric landmarks identified.                           - 2 cm hiatal hernia.                           - Normal esophagus otherwise                           - Retained gastric fluid, raising suspicion for                            possible gastroparesis.                           -  Normal stomach otherwise, biopsies taken to rule                            out H pylori                           - Normal duodenal bulb and second portion of the                            duodenum. Recommendation:           -  Patient has a contact number available for                            emergencies. The signs and symptoms of potential                            delayed complications were discussed with the                            patient. Return to normal activities tomorrow.                            Written discharge instructions were provided to the                            patient.                           - Resume previous diet.                           - Continue present medications.                           - Okay to resume Xarelto today                           - Take pepcid daily if that helps symptoms                           - If symptoms persist despite pepcid consider trial                            of Reglan vs. obtaining gastric emptying study                           - Await pathology results. Remo Lipps P. Havery Moros, MD 04/07/2020 10:36:12 AM This report has been signed electronically.

## 2020-04-07 NOTE — Progress Notes (Signed)
Called to room to assist during endoscopic procedure.  Patient ID and intended procedure confirmed with present staff. Received instructions for my participation in the procedure from the performing physician.  

## 2020-04-07 NOTE — Progress Notes (Signed)
Pt. Not opening her eyes, muttering, "I hate my life.  I want to die, I don't deserve to be here.  I want to be with my brother and sister."   Attempting to get pt. To wake up, and tell her she is here in the [facility] of the [facility].

## 2020-04-08 ENCOUNTER — Ambulatory Visit: Payer: Medicare Other | Admitting: Vascular Surgery

## 2020-04-09 ENCOUNTER — Telehealth: Payer: Self-pay | Admitting: *Deleted

## 2020-04-09 NOTE — Telephone Encounter (Signed)
Left message on f/u call 

## 2020-04-09 NOTE — Telephone Encounter (Signed)
No answer for post procedure call back. Left message for patient to call with questions or concerns. 

## 2020-04-17 ENCOUNTER — Other Ambulatory Visit: Payer: Self-pay | Admitting: Psychiatry

## 2020-04-26 ENCOUNTER — Telehealth (INDEPENDENT_AMBULATORY_CARE_PROVIDER_SITE_OTHER): Payer: Medicare Other | Admitting: Psychiatry

## 2020-04-26 ENCOUNTER — Encounter: Payer: Self-pay | Admitting: Psychiatry

## 2020-04-26 DIAGNOSIS — G4721 Circadian rhythm sleep disorder, delayed sleep phase type: Secondary | ICD-10-CM | POA: Diagnosis not present

## 2020-04-26 DIAGNOSIS — G3184 Mild cognitive impairment, so stated: Secondary | ICD-10-CM

## 2020-04-26 DIAGNOSIS — F411 Generalized anxiety disorder: Secondary | ICD-10-CM

## 2020-04-26 DIAGNOSIS — G2581 Restless legs syndrome: Secondary | ICD-10-CM | POA: Diagnosis not present

## 2020-04-26 DIAGNOSIS — F331 Major depressive disorder, recurrent, moderate: Secondary | ICD-10-CM | POA: Diagnosis not present

## 2020-04-26 MED ORDER — MIRTAZAPINE 30 MG PO TABS: 30.0000 mg | ORAL_TABLET | Freq: Every day | ORAL | 1 refills | Status: DC

## 2020-04-26 NOTE — Progress Notes (Signed)
[name] [id number] 1946/01/19 74 y.o.   Virtual Visit via WebEx  I connected with pt by audio and visual WebEx app and verified that I am speaking with the correct person using two identifiers.   I discussed the limitations, risks, security and privacy concerns of performing an evaluation and management service by t WebEx and the availability of in person appointments. I also discussed with the patient that there may be a patient responsible charge related to this service. The patient expressed understanding and agreed to proceed.  I discussed the assessment and treatment plan with the patient. The patient was provided an opportunity to ask questions and all were answered. The patient agreed with the plan and demonstrated an understanding of the instructions.   The patient was advised to call back or seek an in-person evaluation if the symptoms worsen or if the condition fails to improve as anticipated.  I provided 30 minutes of non-face-to-face time during this encounter. The call started at 230 and ended at 3 o'clock. The patient was located at home and the provider was located office.   Subjective:   Patient ID:  [name] is a 74 y.o. (DOB 1946/01/11) female.  Chief Complaint:  Chief Complaint  Patient presents with  . Follow-up  . Anxiety  . Depression  . Stress    losses and health    Depression        Associated symptoms include decreased concentration and fatigue.  Associated symptoms include no suicidal ideas.  Past medical history includes anxiety.   Anxiety Symptoms include decreased concentration, dizziness, nausea, nervous/anxious behavior and shortness of breath. Patient reports no confusion or suicidal ideas.     [name] presents to the office today for follow-up of depression and anxiety and MCI.    at visit January 09, 2019.  She was having problems with restless legs and ropinirole was increased to 3 mg every afternoon.  There were no other med  changes.  visit Feb 05, 2019.  She was having mixture of depression and anxiety and chronic insomnia with delayed sleep phase.  For the reasons mentioned at the last visit she was started on risperidone 0.25 mg nightly.  She was maintained on duloxetine 90 mg, clonazepam 1 mg nightly, lamotrigine 150 twice daily, and ropinirole 2 mg 1-1/2 tablets nightly for restless legs.  visit September 2020.  She had stopped the risperidone because she only took it a few days.  She felt nausea and spaced out on it.  No other meds were changed.  Last visit December 2020.  The following was noted: Doing very poorly.  Not getting dressed and not out of the house for 5 days.  Hard to walk with pain.  Falling apart overnight.  Feet hurt at night.  Worries over poor circulation to toes.  Placed on blood thinners.  Continued health problems with COPD.  Energy is low and hard time walking.  Arthritis in back.  House on the market and that's stressful.  Sx largely unchanged but "it's circumstances".  Planning to move to [facility] but ambivalent.  Brother in law lives next door and wants her to stay here.  Also stressed over politics and social things.   Had period of crippling nausea and cut all meds back in 1/2.  Thinks it's mental issue.  Nausea is some better with dose reduction but is more depressed.  Plan: Use pill box to improve compliance. DT nausea  split meds but increase duloxetine 30  mg BID and lamotrigine 75 mg BID. She's been on lower dose DT nausea but is more depressed.  January 02, 2020 appointment, the following is noted: She got septic with a UTI and then was admitted to [facility] for re[facility]. Home now.  Getting stronger.  Having to take nausea meds. Also diarrhea.  Would like to be on higher dose of meds but can't tolerate it.  Still on some prednisone.  Pending cardiology with CHF.  Also pulmonology. Sleep is pretty good with meds.   No meds changed.  04/26/20 appt with the following noted:  Missed a  lot of med DT nausea.  Back on duloxetine 60 and lamotrigine 75 mg for a week.  Was more depressed and dizzy off the meds. Nausea comes and goes in waves but could be any time of day but maybe worse in the evening. Eats poorly usually.  If feels bad doesn't want to cook.   Pepcid helps nausea.  Zofran doesn't help. Lately more trouble falling asleep.   RLS managed.  Sleep pretty good, except irregular hours.Pt reports that mood is Depressed worse at night and describes anxiety as worse and moderate in the same dose for depression.  She is also having problems with irritability. Anxiety symptoms include: Excessive Worry, Panic Symptoms,. Pt reports sleep is more regular and consistent with melatonin 10 mg nightly.. Pt reports that appetite is decreased. Pt reports that energy is poor and loss of interest or pleasure in usual activities, poor motivation and withdrawn from usual activities. Concentration is scattered. Crying spells.  Guilt feelings over relations with [name].  Suicidal thoughts:  denied by patient. B In Law next door.    Increased ropinirole helped RLS.  Her sister had bipolar disorder.  Past Psychiatric Medication Trials: Lithium 300 insomnia, Depakote, olanzapine 2.5,  lamotrigine 300, ropinirole as high as 2 mg 3 times daily for restless legs,  Aricept poorly tolerated,  Abilify side effects,  fluoxetine, sertraline, paroxetine, Lexapro, duloxetine 90,  Donepezil SE Long psychiatric history.  Sister bipolar. I have never prescribed lorazepam nor Xanax  Review of Systems:  Review of Systems  Constitutional: Positive for fatigue.  Respiratory: Positive for shortness of breath.   Cardiovascular:       Claudication   Gastrointestinal: Positive for nausea.  Neurological: Positive for dizziness and weakness. Negative for tremors.  Psychiatric/Behavioral: Positive for decreased concentration, depression, dysphoric mood and sleep disturbance. Negative for agitation,  behavioral problems, confusion, hallucinations, self-injury and suicidal ideas. The patient is nervous/anxious. The patient is not hyperactive.    LEG surgery for claudication Jan 3  Medications: I have reviewed the patient's current medications.  Current Outpatient Medications  Medication Sig Dispense Refill  . amLODipine (NORVASC) 2.5 MG tablet Take 1 tablet (2.5 mg total) by mouth daily.    . clonazePAM (KLONOPIN) 0.5 MG tablet TAKE 1 TABLET BY MOUTH AT BEDTIME AS NEEDED FOR ANXIETY 90 tablet 1  . DULoxetine (CYMBALTA) 30 MG capsule TAKE 1 CAPSULE(30 MG) BY MOUTH EVERY EVENING (Patient taking differently: Take 30 mg by mouth 2 (two) times daily. ) 90 capsule 1  . famotidine (PEPCID) 20 MG tablet One at bedtime 30 tablet 11  . lamoTRIgine (LAMICTAL) 150 MG tablet Take 75 mg by mouth daily.    . Melatonin 10 MG TABS Take 20 mg by mouth at bedtime.    . rivaroxaban (XARELTO) 20 MG TABS tablet Take 1 tablet (20 mg total) by mouth daily with supper. 30 tablet 11  . rOPINIRole (  REQUIP) 2 MG tablet TAKE 1 AND 1/2 TABLETS(3 MG) BY MOUTH AT BEDTIME 135 tablet 0  . mirtazapine (REMERON) 30 MG tablet Take 1 tablet (30 mg total) by mouth at bedtime. 30 tablet 1   No current facility-administered medications for this visit.    Medication Side Effects: None unless nausea  Allergies: No Known Allergies  Past Medical History:  Diagnosis Date  . AAA (abdominal aortic aneurysm) (HCC)    3.1 cm 07/08/18, 3 year follow-up recommended; possible 3 cm AAA by aortogram 09/13/18  . Anemia    PMH  . Appendicitis with abscess    07/08/18, s/p perc drain; resolved 07/30/18 by CT  . Arthritis   . Bipolar disorder ([place])   . Cerebrovascular disease    intra and extracranial vascular dx per MRI 4/11, neurology rec strict CVRF control  . Colonic inertia   . Constipation    chronic;severe  . Coronary artery disease   . Depression   . Duodenitis   . EKG abnormalities    changes, stress test neg (false EKG  changes)  . Gastritis   . GERD (gastroesophageal reflux disease)   . Hypertension   . Hypothyroid 01/16/2014  . Meningioma ([place]) 10/21/2013  . PAD (peripheral artery disease) ([place])   . Psoriasis    sees derm  . Stroke ([place])   . Wears dentures   . Wears glasses     Family History  Problem Relation Age of Onset  . Breast cancer Mother        metastisis to bones  . Diabetes Son   . Heart disease Other        grandfather   . Alcohol abuse Brother   . Heart disease Brother   . Heart disease Maternal Aunt   . Lung cancer Brother        smoked  . Colon cancer Neg Hx   . Esophageal cancer Neg Hx   . Stomach cancer Neg Hx     Social History   Socioeconomic History  . Marital status: Widowed    Spouse name: Not on file  . Number of children: 1  . Years of education: Not on file  . Highest education level: Not on file  Occupational History  . Occupation: retired  Tobacco Use  . Smoking status: Former Smoker    Packs/day: 0.75    Years: 60.00    Pack years: 45.00    Types: Cigarettes  . Smokeless tobacco: Never Used  Vaping Use  . Vaping Use: Former  Substance and Sexual Activity  . Alcohol use: Yes    Alcohol/week: 0.0 standard drinks    Comment: yes on occassion  . Drug use: No  . Sexual activity: Not Currently  Other Topics Concern  . Not on file  Social History Narrative   Brother in law Mr Rexford Maus (one of my patients)   Lives w/ husband       Social Determinants of Health   Financial Resource Strain:   . Difficulty of Paying Living Expenses:   Food Insecurity:   . Worried About Charity fundraiser in the Last Year:   . Arboriculturist in the Last Year:   Transportation Needs:   . Film/video editor (Medical):   Marland Kitchen Lack of Transportation (Non-Medical):   Physical Activity:   . Days of Exercise per Week:   . Minutes of Exercise per Session:   Stress:   . Feeling of Stress :   Social  Connections:   . Frequency of Communication with Friends  and Family:   . Frequency of Social Gatherings with Friends and Family:   . Attends Religious Services:   . Active Member of Clubs or Organizations:   . Attends Archivist Meetings:   Marland Kitchen Marital Status:   Intimate Partner Violence:   . Fear of Current or Ex-Partner:   . Emotionally Abused:   Marland Kitchen Physically Abused:   . Sexually Abused:     Past Medical History, Surgical history, Social history, and Family history were reviewed and updated as appropriate.   Please see review of systems for further details on the patient's review from today.   Objective:   Physical Exam:  There were no vitals taken for this visit.  Physical Exam Neurological:     Mental Status: She is alert and oriented to person, place, and time.     Cranial Nerves: No dysarthria.  Psychiatric:        Attention and Perception: She is inattentive. She does not perceive auditory hallucinations.        Mood and Affect: Mood is anxious and depressed.        Speech: Speech normal. Speech is not rapid and pressured or slurred.        Behavior: Behavior is not slowed or aggressive. Behavior is cooperative.        Thought Content: Thought content normal. Thought content is not paranoid or delusional. Thought content does not include homicidal or suicidal ideation. Thought content does not include homicidal or suicidal plan.        Cognition and Memory: Memory is impaired.     Comments:   Still depressed, anxious and less irritable.  Insight and judgment are fair     Lab Review:     Component Value Date/Time   NA 135 01/02/2020 1602   K 4.5 01/02/2020 1602   CL 98 01/02/2020 1602   CO2 25 01/02/2020 1602   GLUCOSE 96 01/02/2020 1602   GLUCOSE 120 (H) 11/26/2019 0650   BUN 17 01/02/2020 1602   CREATININE 1.08 (H) 01/02/2020 1602   CREATININE 0.77 07/28/2015 1805   CALCIUM 9.9 01/02/2020 1602   PROT 7.9 01/02/2020 1602   ALBUMIN 4.2 01/02/2020 1602   AST 15 01/02/2020 1602   ALT 12 01/02/2020 1602    ALKPHOS 87 01/02/2020 1602   BILITOT 0.2 01/02/2020 1602   GFRNONAA 50 (L) 01/02/2020 1602   GFRAA 58 (L) 01/02/2020 1602       Component Value Date/Time   WBC 8.5 01/02/2020 1602   WBC 12.5 (H) 11/26/2019 0650   RBC 4.03 01/02/2020 1602   RBC 3.79 (L) 11/26/2019 0650   HGB 11.9 01/02/2020 1602   HCT 36.4 01/02/2020 1602   PLT 370 01/02/2020 1602   MCV 90 01/02/2020 1602   MCH 29.5 01/02/2020 1602   MCH 29.6 11/26/2019 0650   MCHC 32.7 01/02/2020 1602   MCHC 32.5 11/26/2019 0650   RDW 14.0 01/02/2020 1602   LYMPHSABS 2.7 01/02/2020 1602   MONOABS 0.4 11/23/2019 1957   EOSABS 0.1 01/02/2020 1602   BASOSABS 0.0 01/02/2020 1602    No results found for: POCLITH, LITHIUM   No results found for: PHENYTOIN, PHENOBARB, VALPROATE, CBMZ   .res Assessment: Plan:    Major depressive disorder, recurrent episode, moderate (HCC)  Mild cognitive impairment  Generalized anxiety disorder  Restless legs syndrome  Delayed sleep phase syndrome  [name] has some chronic depression and chronic irregular sleep patterns  which have been resistant to treatment.  Her chronic depression and anxiety are complicated now by double grief of loss of her sister and now her husband.  She is lonely and mood affected by recent severe mood problems.    Rec trial mirtazapine 30 mg HS to help with depression and potentially nausea hopefully.  Nausea is interfering with depression treatment.  Will leave duloxetine and lamotrigine dosage the same though she has not been able to tolerate it so far.  Cont counseling for grief, depression, anxiety, and health crises.   She stopped the donepezil bc GI upset.  Consider Exelon patch for cognition.  But defer this because she is not in a condition to start a new medication at this time.  If nausea persists get a second opinion.  Continue ropinirole 3 mg for RLS.  This is better controlled her restless legs.  Disc SE incl hallucinations and dizziness.   This  high dosage appears medically necessary.  Call if this fails and will consider doing something with the clonazepam either increasing it or perhaps switching to another benzodiazepine which might have less cognitive problems.  Such as lorazepam  Emphasized importance and proper nutrition and sleep and protein.  Doesn't eat much meat bc constipation but will eat fish.  Discussed how protein is necessary for the proper response to depression medicines  FU 2  mos.  Lynder Parents, MD, DFAPA  Please see After Visit Summary for patient specific instructions.  Future Appointments  Date Time Provider [place]  05/03/2020  2:45 PM Laurin Coder, MD [facility] None  05/13/2020  1:00 PM Elam Dutch, MD [facility]    No orders of the defined types were placed in this encounter.     -------------------------------

## 2020-05-03 ENCOUNTER — Ambulatory Visit: Payer: Medicare Other | Admitting: Pulmonary Disease

## 2020-05-05 ENCOUNTER — Encounter: Payer: Self-pay | Admitting: Pulmonary Disease

## 2020-05-05 ENCOUNTER — Other Ambulatory Visit: Payer: Self-pay

## 2020-05-05 ENCOUNTER — Ambulatory Visit (INDEPENDENT_AMBULATORY_CARE_PROVIDER_SITE_OTHER): Payer: Medicare Other | Admitting: Pulmonary Disease

## 2020-05-05 VITALS — BP 160/88 | HR 78 | Temp 96.3°F | Ht 68.0 in | Wt 163.0 lb

## 2020-05-05 DIAGNOSIS — J84112 Idiopathic pulmonary fibrosis: Secondary | ICD-10-CM | POA: Diagnosis not present

## 2020-05-05 NOTE — Progress Notes (Signed)
[name]    [id number]    Jan 06, 1946  Primary Care Physician:Greene, Ranell Patrick, MD  Referring Physician: [name], MD [address] [place],  [place] [address]  Chief complaint:   Shortness of breath, cough She is still tired  HPI:  Patient with history of shortness of breath cough She recollects being told about concern for fibrosis about 4 months ago She had seen Dr. Melvyn Novas in 2017-documentation did indicate concern for pulmonary fibrosis at the time  Recently quit smoking about a month ago, at the worst was smoking a pack a day  Shortness of breath on exertion Still tired with an exercise limit of couple of blocks at most Used to be able to walk couple of miles  Denies any chest pains or chest discomfort Still does have an occasional cough She does have limitations related to hip and joint pains  Complicated recovery from a vascular procedure  No recurrent skin rash No history of inflammatory bowel process No swallowing difficulty   Outpatient Encounter Medications as of 05/05/2020  Medication Sig  . amLODipine (NORVASC) 2.5 MG tablet Take 1 tablet (2.5 mg total) by mouth daily.  . clonazePAM (KLONOPIN) 0.5 MG tablet TAKE 1 TABLET BY MOUTH AT BEDTIME AS NEEDED FOR ANXIETY  . DULoxetine (CYMBALTA) 30 MG capsule TAKE 1 CAPSULE(30 MG) BY MOUTH EVERY EVENING (Patient taking differently: Take 30 mg by mouth 2 (two) times daily. )  . famotidine (PEPCID) 20 MG tablet One at bedtime  . lamoTRIgine (LAMICTAL) 150 MG tablet Take 75 mg by mouth daily.  . Melatonin 10 MG TABS Take 20 mg by mouth at bedtime.  . mirtazapine (REMERON) 30 MG tablet Take 1 tablet (30 mg total) by mouth at bedtime.  . rivaroxaban (XARELTO) 20 MG TABS tablet Take 1 tablet (20 mg total) by mouth daily with supper.  Marland Kitchen rOPINIRole (REQUIP) 2 MG tablet TAKE 1 AND 1/2 TABLETS(3 MG) BY MOUTH AT BEDTIME   No facility-administered encounter medications on file as of 05/05/2020.     Allergies as of 05/05/2020  . (No Known Allergies)    Past Medical History:  Diagnosis Date  . AAA (abdominal aortic aneurysm) (HCC)    3.1 cm 07/08/18, 3 year follow-up recommended; possible 3 cm AAA by aortogram 09/13/18  . Anemia    PMH  . Appendicitis with abscess    07/08/18, s/p perc drain; resolved 07/30/18 by CT  . Arthritis   . Bipolar disorder ([place])   . Cerebrovascular disease    intra and extracranial vascular dx per MRI 4/11, neurology rec strict CVRF control  . Colonic inertia   . Constipation    chronic;severe  . Coronary artery disease   . Depression   . Duodenitis   . EKG abnormalities    changes, stress test neg (false EKG changes)  . Gastritis   . GERD (gastroesophageal reflux disease)   . Hypertension   . Hypothyroid 01/16/2014  . Meningioma ([place]) 10/21/2013  . PAD (peripheral artery disease) ([place])   . Psoriasis    sees derm  . Stroke ([place])   . Wears dentures   . Wears glasses     Past Surgical History:  Procedure Laterality Date  . ABDOMINAL AORTOGRAM W/LOWER EXTREMITY N/A 09/13/2018   Procedure: ABDOMINAL AORTOGRAM W/LOWER EXTREMITY;  Surgeon: Elam Dutch, MD;  Location: [facility];  Service: Cardiovascular;  Laterality: N/A;  . arthroscopy  04/2010   Right knee  .  CATARACT EXTRACTION W/ INTRAOCULAR LENS  IMPLANT, BILATERAL    . COLONOSCOPY    . FEMORAL-POPLITEAL BYPASS GRAFT  10/22/2018  . FEMORAL-POPLITEAL BYPASS GRAFT Left 10/22/2018   Procedure: LEFT FEMORAL TO BELOW THE KNEE POPLITEAL ARTERY BYPASS GRAFT;  Surgeon: Elam Dutch, MD;  Location: [facility];  Service: Vascular;  Laterality: Left;  . I & D EXTREMITY Left 11/08/2018   Procedure: IRRIGATION AND DEBRIDEMENT EXTREMITY Left Leg;  Surgeon: Elam Dutch, MD;  Location: [facility];  Service: Vascular;  Laterality: Left;  . IR RADIOLOGIST EVAL & MGMT  07/30/2018  . MULTIPLE TOOTH EXTRACTIONS    . TUBAL LIGATION      Family History  Problem Relation Age of Onset  . Breast  cancer Mother        metastisis to bones  . Diabetes Son   . Heart disease Other        grandfather   . Alcohol abuse Brother   . Heart disease Brother   . Heart disease Maternal Aunt   . Lung cancer Brother        smoked  . Colon cancer Neg Hx   . Esophageal cancer Neg Hx   . Stomach cancer Neg Hx     Social History   Socioeconomic History  . Marital status: Widowed    Spouse name: Not on file  . Number of children: 1  . Years of education: Not on file  . Highest education level: Not on file  Occupational History  . Occupation: retired  Tobacco Use  . Smoking status: Former Smoker    Packs/day: 0.75    Years: 60.00    Pack years: 45.00    Types: Cigarettes    Quit date: 11/10/2019    Years since quitting: 0.4  . Smokeless tobacco: Never Used  Vaping Use  . Vaping Use: Former  Substance and Sexual Activity  . Alcohol use: Yes    Alcohol/week: 0.0 standard drinks    Comment: yes on occassion  . Drug use: No  . Sexual activity: Not Currently  Other Topics Concern  . Not on file  Social History Narrative   Brother in law Mr Rexford Maus (one of my patients)   Lives w/ husband       Social Determinants of Health   Financial Resource Strain:   . Difficulty of Paying Living Expenses: Not on file  Food Insecurity:   . Worried About Charity fundraiser in the Last Year: Not on file  . Ran Out of Food in the Last Year: Not on file  Transportation Needs:   . Lack of Transportation (Medical): Not on file  . Lack of Transportation (Non-Medical): Not on file  Physical Activity:   . Days of Exercise per Week: Not on file  . Minutes of Exercise per Session: Not on file  Stress:   . Feeling of Stress : Not on file  Social Connections:   . Frequency of Communication with Friends and Family: Not on file  . Frequency of Social Gatherings with Friends and Family: Not on file  . Attends Religious Services: Not on file  . Active Member of Clubs or Organizations: Not on  file  . Attends Archivist Meetings: Not on file  . Marital Status: Not on file  Intimate Partner Violence:   . Fear of Current or Ex-Partner: Not on file  . Emotionally Abused: Not on file  . Physically Abused: Not on file  . Sexually Abused:  Not on file    Review of Systems  Constitutional: Negative.   HENT: Negative.   Respiratory: Positive for cough and shortness of breath.   Musculoskeletal: Positive for arthralgias.  All other systems reviewed and are negative.   Vitals:   05/05/20 1633  BP: (!) 160/88  Pulse: 78  Temp: (!) 96.3 F (35.7 C)  SpO2: 97%     Physical Exam Constitutional:      Appearance: She is well-developed.  HENT:     Head: Normocephalic and atraumatic.  Eyes:     General:        Right eye: No discharge.  Neck:     Thyroid: No thyromegaly.     Trachea: No tracheal deviation.  Cardiovascular:     Rate and Rhythm: Normal rate and regular rhythm.     Heart sounds: Normal heart sounds.  Pulmonary:     Effort: Pulmonary effort is normal. No respiratory distress.     Breath sounds: Rales present. No wheezing.  Chest:     Chest wall: No tenderness.  Musculoskeletal:     Cervical back: Normal range of motion and neck supple.      Data Reviewed: CT scan of the chest from 2017 reviewed showing a right upper lobe nodule, 5 mm, evidence of emphysema, evidence of prominent interstitial markings Abdominal CT recently in 2020 did reveal progressive findings of fibrosis at the bases CT scan of the chest performed in in March 2021 did reveal significant interstitial process Follow-up chest x-rays did show improvement in findings  Assessment:  Cough  Shortness of breath on exertion  Obstructive lung disease -Emphysema on previous CT -No previous PFT on record Pulmonary fibrosis -Progressive findings on CT scan of the chest -CT scan of the chest from March did show worsening findings -Follow-up chest x-rays did reveal  improvement  Abnormal CT scan of the chest showing a 5 mm nodule right upper lobe -Significant smoking history  Plan/Recommendations: Obtain high-resolution CT scan of the chest -Will be obtained as soon as possible  Obtain pulmonary function study  ANA 04/28/2019 had been positive  Encouraged to call with any significant concerns  May require inhalers for shortness of breath but will wait until after the PFT to assess degree of obstructive disease  Sherrilyn Rist MD [facility] 05/05/2020, 4:41 PM  CC: [name], MD

## 2020-05-05 NOTE — Patient Instructions (Signed)
History of cough, respiratory failure Abnormal CT scan of the chest showing multifocal haziness  Repeat CT Obtain PFT  High-resolution CT scan of the chest Breathing study  Follow-up in 3 months  Call with significant concerns

## 2020-05-13 ENCOUNTER — Ambulatory Visit: Payer: Medicare Other | Admitting: Vascular Surgery

## 2020-06-03 ENCOUNTER — Ambulatory Visit: Payer: Medicare Other | Admitting: Vascular Surgery

## 2020-06-08 ENCOUNTER — Other Ambulatory Visit: Payer: Self-pay | Admitting: Psychiatry

## 2020-06-08 ENCOUNTER — Telehealth: Payer: Self-pay | Admitting: Psychiatry

## 2020-06-08 MED ORDER — CLONAZEPAM 0.5 MG PO TABS: ORAL_TABLET | ORAL | 1 refills | Status: DC

## 2020-06-08 NOTE — Progress Notes (Signed)
PDMP does not show an early refill.

## 2020-06-08 NOTE — Telephone Encounter (Signed)
Pt LM reporting the last sleep med given did not work for her. Gave her bad stomach pain.She stopped taking. Also, she has started taking .75mg  of Clonazepam and has ran out too soon due to that reason. Asking for new Rx for Clonazepam for 1 mg to pharmacy. [facility] on file. Due follow up Oct. Or Nov. Call with any concerns to pt [phone]

## 2020-06-08 NOTE — Telephone Encounter (Signed)
Please review

## 2020-06-08 NOTE — Telephone Encounter (Signed)
Tell her I'll RX clonazepam 0.5 mg 1-2 tablets at night for sleep.  Try to get by if possible on 1 and 1/2 tablets at night and don't take more than 2.  This med can worsen her memory so take as little as possible.  Sent in RX and said OK to refill this one early.

## 2020-06-08 NOTE — Telephone Encounter (Signed)
Left her a vm to return my call.

## 2020-06-10 NOTE — Telephone Encounter (Signed)
Left her a vm to return my call.

## 2020-06-23 ENCOUNTER — Encounter: Payer: Self-pay | Admitting: Emergency Medicine

## 2020-06-23 ENCOUNTER — Other Ambulatory Visit: Payer: Self-pay

## 2020-06-23 ENCOUNTER — Telehealth (INDEPENDENT_AMBULATORY_CARE_PROVIDER_SITE_OTHER): Payer: Medicare Other | Admitting: Emergency Medicine

## 2020-06-23 VITALS — Ht 69.0 in | Wt 170.0 lb

## 2020-06-23 DIAGNOSIS — G4489 Other headache syndrome: Secondary | ICD-10-CM | POA: Diagnosis not present

## 2020-06-23 MED ORDER — HYDROCODONE-ACETAMINOPHEN 5-325 MG PO TABS: 1.0000 | ORAL_TABLET | Freq: Four times a day (QID) | ORAL | 0 refills | Status: DC | PRN

## 2020-06-23 NOTE — Progress Notes (Signed)
Telemedicine Encounter- SOAP NOTE Established Patient  This telephone encounter was conducted with the patient's (or proxy's) verbal consent via audio telecommunications: yes/no: Yes Patient was instructed to have this encounter in a suitably private space; and to only have persons present to whom they give permission to participate. In addition, patient identity was confirmed by use of name plus two identifiers (DOB and address).  I discussed the limitations, risks, security and privacy concerns of performing an evaluation and management service by telephone and the availability of in person appointments. I also discussed with the patient that there may be a patient responsible charge related to this service. The patient expressed understanding and agreed to proceed.  I spent a total of TIME; 0 MIN TO 60 MIN: 20 minutes talking with the patient or their proxy.  Chief Complaint  Patient presents with  . Headache    per patient it started today and using cold compress bags of peas    Subjective   [name] is a 74 y.o. female established patient. Telephone visit today complaining of "migraine headache" that started this morning with nausea and lasted several hours before it started getting better.  However still present.  Generalized headache with no radiation and no other associated symptoms.  Has had similar headaches before.  HPI   Patient Active Problem List   Diagnosis Date Noted  . Acute on chronic respiratory failure with hypoxia ([place]) 12/26/2019  . Acute on chronic diastolic heart failure ([place]) 12/26/2019  . Shortness of breath 12/26/2019  . Bilateral pulmonary infiltrates on CXR   . Acute hypoxemic respiratory failure ([place])   . Complicated UTI (urinary tract infection) 11/11/2019  . AKI (acute kidney injury) ([place]) 11/11/2019  . Hyponatremia 11/11/2019  . Elevated LFTs 11/11/2019  . Sepsis ([place]) 11/11/2019  . Wound infection 11/07/2018  . Postoperative pain   .  Sleep disturbance   . Tobacco abuse   . Acute blood loss anemia   . Hypoalbuminemia due to protein-calorie malnutrition ([place])   . Debility 10/24/2018  . Pre-operative cardiovascular examination 09/26/2018  . HLD (hyperlipidemia) 09/26/2018  . GAD (generalized anxiety disorder) 08/07/2018  . Major depressive disorder, single episode 08/07/2018  . Appendiceal abscess 07/08/2018  . PVD (peripheral vascular disease) ([place]) 03/15/2018  . Atherosclerosis of native artery of left lower extremity with intermittent claudication ([place]) 03/15/2018  . Chronic left-sided low back pain with left-sided sciatica 12/25/2017  . Facial droop   . History of CVA (cerebrovascular accident) 10/23/2017  . Critical lower limb ischemia ([place]) 11/08/2016  . Smoker 11/08/2016  . Postinflammatory pulmonary fibrosis ([place]) 02/14/2016  . Cigarette smoker 12/24/2015  . Hypothyroid ? 01/16/2014  . Mild cognitive impairment 01/16/2014  . Meningioma ([place]) 10/21/2013  . Chest pain 10/20/2013  . Medicare annual wellness visit, subsequent 06/16/2013  . Dizziness and giddiness 06/16/2013  . RLS (restless legs syndrome) 04/29/2012  . Abdominal pain 12/27/2010  . DEGENERATIVE JOINT DISEASE 09/23/2010  . CAROTID ARTERY DISEASE 08/15/2010  . CHEST PAIN 08/15/2010  . HEADACHE 12/02/2009  . BACK PAIN 11/22/2009  . CONSTIPATION, CHRONIC 01/29/2008  . NAUSEA 01/28/2008  . DEPRESSION 03/08/2007  . HTN (hypertension) 03/08/2007    Past Medical History:  Diagnosis Date  . AAA (abdominal aortic aneurysm) (HCC)    3.1 cm 07/08/18, 3 year follow-up recommended; possible 3 cm AAA by aortogram 09/13/18  . Anemia    PMH  . Appendicitis with abscess    07/08/18, s/p perc drain; resolved 07/30/18 by CT  .  Arthritis   . Bipolar disorder ([place])   . Cerebrovascular disease    intra and extracranial vascular dx per MRI 4/11, neurology rec strict CVRF control  . Colonic inertia   . Constipation    chronic;severe  . Coronary artery  disease   . Depression   . Duodenitis   . EKG abnormalities    changes, stress test neg (false EKG changes)  . Gastritis   . GERD (gastroesophageal reflux disease)   . Hypertension   . Hypothyroid 01/16/2014  . Meningioma ([place]) 10/21/2013  . PAD (peripheral artery disease) ([place])   . Psoriasis    sees derm  . Stroke ([place])   . Wears dentures   . Wears glasses     Current Outpatient Medications  Medication Sig Dispense Refill  . clonazePAM (KLONOPIN) 0.5 MG tablet 1 and 1/2 or 2 tablets at night as needed for sleep. 45 tablet 1  . DULoxetine (CYMBALTA) 30 MG capsule TAKE 1 CAPSULE(30 MG) BY MOUTH EVERY EVENING (Patient taking differently: Take 30 mg by mouth 2 (two) times daily. ) 90 capsule 1  . famotidine (PEPCID) 20 MG tablet One at bedtime 30 tablet 11  . mirtazapine (REMERON) 30 MG tablet Take 1 tablet (30 mg total) by mouth at bedtime. 30 tablet 1  . rivaroxaban (XARELTO) 20 MG TABS tablet Take 1 tablet (20 mg total) by mouth daily with supper. 30 tablet 11  . rOPINIRole (REQUIP) 2 MG tablet TAKE 1 AND 1/2 TABLETS(3 MG) BY MOUTH AT BEDTIME 135 tablet 0  . amLODipine (NORVASC) 2.5 MG tablet Take 1 tablet (2.5 mg total) by mouth daily. (Patient not taking: Reported on 06/23/2020)    . lamoTRIgine (LAMICTAL) 150 MG tablet Take 75 mg by mouth daily. (Patient not taking: Reported on 06/23/2020)    . Melatonin 10 MG TABS Take 20 mg by mouth at bedtime.     No current facility-administered medications for this visit.    No Known Allergies  Social History   Socioeconomic History  . Marital status: Widowed    Spouse name: Not on file  . Number of children: 1  . Years of education: Not on file  . Highest education level: Not on file  Occupational History  . Occupation: retired  Tobacco Use  . Smoking status: Former Smoker    Packs/day: 0.75    Years: 60.00    Pack years: 45.00    Types: Cigarettes    Quit date: 11/10/2019    Years since quitting: 0.6  . Smokeless tobacco:  Never Used  Vaping Use  . Vaping Use: Former  Substance and Sexual Activity  . Alcohol use: Yes    Alcohol/week: 0.0 standard drinks    Comment: yes on occassion  . Drug use: No  . Sexual activity: Not Currently  Other Topics Concern  . Not on file  Social History Narrative   Brother in law Mr Rexford Maus (one of my patients)   Lives w/ husband       Social Determinants of Health   Financial Resource Strain:   . Difficulty of Paying Living Expenses: Not on file  Food Insecurity:   . Worried About Charity fundraiser in the Last Year: Not on file  . Ran Out of Food in the Last Year: Not on file  Transportation Needs:   . Lack of Transportation (Medical): Not on file  . Lack of Transportation (Non-Medical): Not on file  Physical Activity:   . Days of Exercise per  Week: Not on file  . Minutes of Exercise per Session: Not on file  Stress:   . Feeling of Stress : Not on file  Social Connections:   . Frequency of Communication with Friends and Family: Not on file  . Frequency of Social Gatherings with Friends and Family: Not on file  . Attends Religious Services: Not on file  . Active Member of Clubs or Organizations: Not on file  . Attends Archivist Meetings: Not on file  . Marital Status: Not on file  Intimate Partner Violence:   . Fear of Current or Ex-Partner: Not on file  . Emotionally Abused: Not on file  . Physically Abused: Not on file  . Sexually Abused: Not on file    Review of Systems  Constitutional: Negative.  Negative for chills and fever.  HENT: Negative.  Negative for congestion, nosebleeds, sinus pain and sore throat.   Eyes: Negative.  Negative for blurred vision, double vision, photophobia, pain, discharge and redness.  Respiratory: Negative.  Negative for cough and shortness of breath.   Cardiovascular: Negative.  Negative for chest pain and palpitations.  Gastrointestinal: Positive for nausea. Negative for abdominal pain, diarrhea and  vomiting.  Genitourinary: Negative.  Negative for dysuria.  Skin: Negative.  Negative for rash.  Neurological: Positive for headaches. Negative for speech change, focal weakness, seizures and loss of consciousness.  All other systems reviewed and are negative.   Objective  Alert and oriented x3 in no apparent respiratory distress. Vitals as reported by the patient: Today's Vitals   06/23/20 1636  Weight: 170 lb (77.1 kg)  Height: 5\' 9"  (1.753 m)    There are no diagnoses linked to this encounter. [place] was seen today for headache.  Diagnoses and all orders for this visit:  Other headache syndrome -     HYDROcodone-acetaminophen (NORCO) 5-325 MG tablet; Take 1 tablet by mouth every 6 (six) hours as needed.    Clinically stable.  No red flag signs or symptoms.  Advised to contact the office if no better or worse during the next several days. I discussed the assessment and treatment plan with the patient. The patient was provided an opportunity to ask questions and all were answered. The patient agreed with the plan and demonstrated an understanding of the instructions.   The patient was advised to call back or seek an in-person evaluation if the symptoms worsen or if the condition fails to improve as anticipated.  I provided 20 minutes of non-face-to-face time during this encounter.  Horald Pollen, MD  Primary Care at [facility]

## 2020-07-01 ENCOUNTER — Ambulatory Visit (INDEPENDENT_AMBULATORY_CARE_PROVIDER_SITE_OTHER): Payer: Medicare Other | Admitting: Psychiatry

## 2020-07-01 DIAGNOSIS — F331 Major depressive disorder, recurrent, moderate: Secondary | ICD-10-CM

## 2020-07-01 DIAGNOSIS — G8929 Other chronic pain: Secondary | ICD-10-CM | POA: Diagnosis not present

## 2020-07-01 DIAGNOSIS — M5442 Lumbago with sciatica, left side: Secondary | ICD-10-CM

## 2020-07-01 DIAGNOSIS — J84112 Idiopathic pulmonary fibrosis: Secondary | ICD-10-CM

## 2020-07-01 DIAGNOSIS — Z8673 Personal history of transient ischemic attack (TIA), and cerebral infarction without residual deficits: Secondary | ICD-10-CM | POA: Diagnosis not present

## 2020-07-01 DIAGNOSIS — G3184 Mild cognitive impairment, so stated: Secondary | ICD-10-CM

## 2020-07-01 DIAGNOSIS — Z8619 Personal history of other infectious and parasitic diseases: Secondary | ICD-10-CM

## 2020-07-01 DIAGNOSIS — G4721 Circadian rhythm sleep disorder, delayed sleep phase type: Secondary | ICD-10-CM | POA: Diagnosis not present

## 2020-07-01 NOTE — Progress Notes (Signed)
Psychotherapy Progress Note [facility] Luan Moore, PhD LP  Patient ID: [name]     MRN: [id number] Therapy format: Individual psychotherapy Date: 07/01/2020      Start: 3:17p     Stop: 4:07p     Time Spent: 50 min Location: Telehealth visit -- I connected with this patient by an approved telecommunication method (video), with her informed consent, and verifying identity and patient privacy.  I was located at my office and patient at her home.  As needed, we discussed the limitations, risks, and security and privacy concerns associated with telehealth service, including the availability and conditions which currently govern in-person appointments and the possibility that 3rd-party payment may not be fully guaranteed and she may be responsible for charges.  After she indicated understanding, we proceeded with the session.  Also discussed treatment planning, as needed, including ongoing verbal agreement with the plan, the opportunity to ask and answer all questions, her demonstrated understanding of instructions, and her readiness to call the office should symptoms worsen or she feels she is in a crisis state and needs more immediate and tangible assistance.   Session narrative (presenting needs, interim history, self-report of stressors and symptoms, applications of prior therapy, status changes, and interventions made in session) Realizes she has a longterm tendency to curl up and hide instead of reaching out.  Originally scheduled to come in person, given she was feeling some better, but says she has been largely "a hermit" since last seen.  Admits she continues to stay up til 6am, sleep till 4 or 5pm.  One day slept until 7pm and Nino came over to check on her.  Doesn't shower that much, keeps to herself, doesn't give a damn.  Last night went to bed 2am, got up about 8 or 8:30am when the dog woke her, decided to stay up.  Commits to go to bed early again tonight in an  effort to right the ship with her circadian rhythm.  Says her husband [name] is still present -- gardener noted seeing a strange man in the utility room window, looking onto a bush [name] used to take care of, while working with that bush.  Convinced her husband's ghost is actually living at the house.  Also her sister Dola Argyle.  All positive, e.g., Suzie laughing, taken as a gift.  Had one experience of feeling her deceased sister's spirit rushing past her to join her grandson.  Was encouraged for a while that brother-in-law seemed to be coming around to belief in supernatural visitations, but feels he has retreated into cynicism again.  Longs for both Suzie and [name] terribly.  Misses Judeth Porch a lot, too, but clarifies she and Judeth Porch never grew up together, were only allowed to meet in young adulthood, but bonded immediately.  Been mulling over her own regrets of late.  Has felt free to talk to [name] in spirit, even spoken her regrets, but admits she hasn't asked for his forgiveness (e.g., for neglecting him, getting irritable).  Admits she probably feels like she doesn't deserve it.  Challenged to consider that forgiveness is not about "deserving" in the first place, but freedom to go on anyway and live out one's values well, including as honor or pleasure to the deceased.  For present situation, urged self-understanding that she has physical limitations from extraordinary wear and tear she has been through.  Breathing is a worry to her, but good readings.  May see about taking her concentrator upstairs.  Reviewed  COPD finding but encouragement to not depend on oxygen.  Admits she resumed smoking, in her bereavement and loneliness.  Encouraged quit again, as she has made it before and it will help her with quality of breathing and living.  Does have plans to entertain her son in November and to visit [place] near Christmas.  Affirmed and encouraged.  Therapeutic modalities: Cognitive Behavioral  Therapy and Solution-Oriented/Positive Psychology  Mental Status/Observations:  Appearance:   Casual     Behavior:  Appropriate  Motor:  Normal  Speech/Language:   Clear and Coherent  Affect:  Appropriate  Mood:  depressed  Thought process:  normal  Thought content:    paranormal experiences, not apparently pathological  Sensory/Perceptual disturbances:    WNL  Orientation:  grossly intact  Attention:  Good    Concentration:  Fair  Memory:  grossly intact  Insight:    Good  Judgment:   Fair  Impulse Control:  Fair   Risk Assessment: Danger to Self: No Self-injurious Behavior: No Danger to Others: No Physical Aggression / Violence: No Duty to Warn: No Access to Firearms a concern: No  Assessment of progress:  stabilized  Diagnosis:   ICD-10-CM   1. Major depressive disorder, recurrent episode, moderate (HCC)  F33.1   2. Mild cognitive impairment  G31.84   3. 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 History of CVA (cerebrovascular accident)  Z86.73   5. History of sepsis (recent, in long-term recovery)  Z86.19   6. Chronic left-sided low back pain with left-sided sciatica  M54.42    G89.29   7. Idiopathic pulmonary fibrosis (HCC) (per chart; pt self-describes COPD)  J84.112    Plan:  . Work bedtime earlier and stabilize earlier than 2am to improve physiological regulation and overall health and mood . Option to continue communication, imaginal or believed, with deceased loved ones directed toward expressing and forgiving regrets and taking inspiration to live more wholesomely . Try to quit smoking  . Other recommendations/advice as may be noted above . Continue to utilize previously learned skills ad lib . Maintain medication as prescribed and work faithfully with relevant prescriber(s) if any changes are desired or seem indicated . Call the clinic on-call service, present to ER, or call 911 if any life-threatening psychiatric crisis Return 2-3 wks, for time as  available. . Already scheduled visit in this office 08/04/2020.  Blanchie Serve, PhD Luan Moore, PhD LP Clinical Psychologist, [facility] [address] [place], [place] [address] [phone]

## 2020-07-14 ENCOUNTER — Telehealth: Payer: Self-pay | Admitting: Psychiatry

## 2020-07-14 NOTE — Telephone Encounter (Signed)
See other phone message  

## 2020-07-14 NOTE — Telephone Encounter (Signed)
Patient is having anxiety issues and has been taking Clonazepam. She's been taking  [address] [place] Recent Vital Signs   @VS @   Past Medical History:  Diagnosis Date  . AAA (abdominal aortic aneurysm) (HCC)    3.1 cm 07/08/18, 3 year follow-up recommended; possible 3 cm AAA by aortogram 09/13/18  . Anemia    PMH  . Appendicitis with abscess    07/08/18, s/p perc drain; resolved 07/30/18 by CT  . Arthritis   . Bipolar disorder ([place])   . Cerebrovascular disease    intra and extracranial vascular dx per MRI 4/11, neurology rec strict CVRF control  . Colonic inertia   . Constipation    chronic;severe  . Coronary artery disease   . Depression   . Duodenitis   . EKG abnormalities    changes, stress test neg (false EKG changes)  . Gastritis   . GERD (gastroesophageal reflux disease)   . Hypertension   . Hypothyroid 01/16/2014  . Meningioma ([place]) 10/21/2013  . PAD (peripheral artery disease) ([place])   . Psoriasis    sees derm  . Stroke ([place])   . Wears dentures   . Wears glasses      Expected Discharge Date  @FLOW (712197::5)@  Diet Order    None       VTE Documentation  @FLOW (7060410::1)@   Work Intensity Score/Level of Care  @FLOW (10536::1)@  @LEVELOFCARE @   Mobility  @FLOW (7060220::1)@     Significant Events    DC Barriers   Abnormal Labs:  Owens Loffler 07/14/2020, 2:52 PM mg and

## 2020-07-14 NOTE — Telephone Encounter (Signed)
Patient is having trouble with anxiety and wants to know if she can increase her Clonazepam from .5 mg to 1 mg. She is asking if Dr. Clovis Pu can call her. Her # is [phone].

## 2020-07-14 NOTE — Telephone Encounter (Signed)
I am concerned about the effect on her memory if I increase the clonazepam.  Instead I suggest we change to lorazepam which is less risky but otherwise very similar in effect for anxiety, in fact, faster acting.  If agrees Rx lorazepam 0.5 mg tablet, 1 tablet 4 times daily as needed for sleep or anxiety, #120, 0 refills.

## 2020-07-14 NOTE — Telephone Encounter (Signed)
Okay to increase her dose?

## 2020-07-15 ENCOUNTER — Other Ambulatory Visit: Payer: Self-pay

## 2020-07-15 MED ORDER — LORAZEPAM 0.5 MG PO TABS: ORAL_TABLET | ORAL | 0 refills | Status: DC

## 2020-07-15 NOTE — Telephone Encounter (Signed)
Rtc to patient and explained the recommendation for changing medication from Clonazepam to Lorazepam 0.5 mg 1 tablet four times a day as needed for anxiety and or sleep. She verbalized understanding.   Rx sent to [facility] per request Clonazepam discontinued

## 2020-07-15 NOTE — Telephone Encounter (Signed)
Left message for her to call back to discuss

## 2020-08-04 ENCOUNTER — Telehealth (INDEPENDENT_AMBULATORY_CARE_PROVIDER_SITE_OTHER): Payer: Medicare Other | Admitting: Psychiatry

## 2020-08-04 ENCOUNTER — Encounter: Payer: Self-pay | Admitting: Psychiatry

## 2020-08-04 DIAGNOSIS — Z8673 Personal history of transient ischemic attack (TIA), and cerebral infarction without residual deficits: Secondary | ICD-10-CM

## 2020-08-04 DIAGNOSIS — G4721 Circadian rhythm sleep disorder, delayed sleep phase type: Secondary | ICD-10-CM | POA: Diagnosis not present

## 2020-08-04 DIAGNOSIS — F331 Major depressive disorder, recurrent, moderate: Secondary | ICD-10-CM

## 2020-08-04 DIAGNOSIS — F411 Generalized anxiety disorder: Secondary | ICD-10-CM

## 2020-08-04 DIAGNOSIS — G3184 Mild cognitive impairment, so stated: Secondary | ICD-10-CM | POA: Diagnosis not present

## 2020-08-04 DIAGNOSIS — G2581 Restless legs syndrome: Secondary | ICD-10-CM

## 2020-08-04 MED ORDER — CLONAZEPAM 0.5 MG PO TABS: 0.5000 mg | ORAL_TABLET | Freq: Two times a day (BID) | ORAL | 1 refills | Status: DC | PRN

## 2020-08-04 MED ORDER — LAMOTRIGINE 25 MG PO TABS: ORAL_TABLET | ORAL | 1 refills | Status: DC

## 2020-08-04 MED ORDER — ROPINIROLE HCL 2 MG PO TABS: ORAL_TABLET | ORAL | 0 refills | Status: DC

## 2020-08-04 NOTE — Progress Notes (Signed)
[name] [id number] 1946/04/02 74 y.o.   Virtual Visit via WebEx  I connected with pt by audio and visual WebEx app and verified that I am speaking with the correct person using two identifiers.   I discussed the limitations, risks, security and privacy concerns of performing an evaluation and management service by t WebEx and the availability of in person appointments. I also discussed with the patient that there may be a patient responsible charge related to this service. The patient expressed understanding and agreed to proceed.  I discussed the assessment and treatment plan with the patient. The patient was provided an opportunity to ask questions and all were answered. The patient agreed with the plan and demonstrated an understanding of the instructions.   The patient was advised to call back or seek an in-person evaluation if the symptoms worsen or if the condition fails to improve as anticipated.  I provided 30 minutes of non-face-to-face time during this encounter. The call started at 300 and ended at 3:30 o'clock. The patient was located at home and the provider was located office.   Subjective:   Patient ID:  [name] is a 74 y.o. (DOB [birth year]) female.  Chief Complaint:  Chief Complaint  Patient presents with  . Follow-up  . Depression  . Anxiety  . Stress    health  . Memory Loss    Depression        Associated symptoms include decreased concentration and fatigue.  Associated symptoms include no suicidal ideas.  Past medical history includes anxiety.   Anxiety Symptoms include decreased concentration, dizziness, nausea, nervous/anxious behavior and shortness of breath. Patient reports no confusion or suicidal ideas.     [name] presents to the office today for follow-up of depression and anxiety and MCI.    at visit January 09, 2019.  She was having problems with restless legs and ropinirole was increased to 3 mg every afternoon.  There were no  other med changes.  visit Feb 05, 2019.  She was having mixture of depression and anxiety and chronic insomnia with delayed sleep phase.  For the reasons mentioned at the last visit she was started on risperidone 0.25 mg nightly.  She was maintained on duloxetine 90 mg, clonazepam 1 mg nightly, lamotrigine 150 twice daily, and ropinirole 2 mg 1-1/2 tablets nightly for restless legs.  visit September 2020.  She had stopped the risperidone because she only took it a few days.  She felt nausea and spaced out on it.  No other meds were changed.  Last visit December 2020.  The following was noted: Doing very poorly.  Not getting dressed and not out of the house for 5 days.  Hard to walk with pain.  Falling apart overnight.  Feet hurt at night.  Worries over poor circulation to toes.  Placed on blood thinners.  Continued health problems with COPD.  Energy is low and hard time walking.  Arthritis in back.  House on the market and that's stressful.  Sx largely unchanged but "it's circumstances".  Planning to move to [facility] but ambivalent.  Brother in law lives next door and wants her to stay here.  Also stressed over politics and social things.   Had period of crippling nausea and cut all meds back in 1/2.  Thinks it's mental issue.  Nausea is some better with dose reduction but is more depressed.  Plan: Use pill box to improve compliance. DT nausea  split meds but increase  duloxetine 30 mg BID and lamotrigine 75 mg BID. She's been on lower dose DT nausea but is more depressed.  January 02, 2020 appointment, the following is noted: She got septic with a UTI and then was admitted to [facility] for rehab. Home now.  Getting stronger.  Having to take nausea meds. Also diarrhea.  Would like to be on higher dose of meds but can't tolerate it.  Still on some prednisone.  Pending cardiology with CHF.  Also pulmonology. Sleep is pretty good with meds.   No meds changed.  04/26/20 appt with the following noted:   Missed a lot of med DT nausea.  Back on duloxetine 60 and lamotrigine 75 mg for a week.  Was more depressed and dizzy off the meds. Nausea comes and goes in waves but could be any time of day but maybe worse in the evening. Eats poorly usually.  If feels bad doesn't want to cook.   Pepcid helps nausea.  Zofran doesn't help. Lately more trouble falling asleep.  Plan: Rec trial mirtazapine 30 mg HS to help with depression and potentially nausea hopefully.  Nausea is interfering with depression treatment.  08/04/20 appt with following noted: Took mirtazapine 30 mg for a week and felt like a zombie with jitteriness outside.  Didn't help nausea much.  Noticing a pattern with nausea related to constipation and bowel problems.  Not ready to take lamotrigine bc it's hard to swallow but felt emotionally better when on it.  Wonders if bipolar bc jumps from topic to topic and talks a lot. Wants to stay on clonazepam to help her sleep.  Aware of memory concerns. Not taking lorazepam but not sure why she isn't.  Thinks it doesn't work as well as clonazepam.  Says she's taking clonazepam at night now.  RLS managed.  Sleep pretty good, except irregular hours.Pt reports that mood is Depressed worse at night and describes anxiety as worse and moderate in the same dose for depression but comes and goes.  She wonders if she might be bipolar like her sister.She is also having problems with irritability. Anxiety symptoms include: Excessive Worry, Panic Symptoms,. Pt reports sleep is more regular and consistent with melatonin 10 mg nightly.. Pt reports that appetite is decreased. Pt reports that energy is poor and loss of interest or pleasure in usual activities, poor motivation and withdrawn from usual activities. Concentration is scattered.  Suicidal thoughts:  denied by patient. B In Law next door.    Increased ropinirole helped RLS.  Her sister had bipolar disorder.  Past Psychiatric Medication Trials: Lithium 300  insomnia, Depakote, olanzapine 2.5,  lamotrigine 300, ropinirole as high as 2 mg 3 times daily for restless legs,  Aricept poorly tolerated,  Abilify side effects,  fluoxetine, sertraline, paroxetine, Lexapro, duloxetine 90,  Mirtazapine 30 zombie Donepezil SE Long psychiatric history.  Sister bipolar. I have never prescribed lorazepam nor Xanax  Review of Systems:  Review of Systems  Constitutional: Positive for fatigue.  Respiratory: Positive for shortness of breath.   Cardiovascular:       Claudication   Gastrointestinal: Positive for nausea. Negative for anal bleeding.  Neurological: Positive for dizziness and weakness. Negative for tremors.  Psychiatric/Behavioral: Positive for decreased concentration, depression, dysphoric mood and sleep disturbance. Negative for agitation, behavioral problems, confusion, hallucinations, self-injury and suicidal ideas. The patient is nervous/anxious. The patient is not hyperactive.    LEG surgery for claudication Jan 3  Medications: I have reviewed the patient's current medications.  Current  Outpatient Medications  Medication Sig Dispense Refill  . amLODipine (NORVASC) 2.5 MG tablet Take 1 tablet (2.5 mg total) by mouth daily.    . DULoxetine (CYMBALTA) 30 MG capsule TAKE 1 CAPSULE(30 MG) BY MOUTH EVERY EVENING (Patient taking differently: Take 30 mg by mouth daily. ) 90 capsule 1  . famotidine (PEPCID) 20 MG tablet One at bedtime 30 tablet 11  . HYDROcodone-acetaminophen (NORCO) 5-325 MG tablet Take 1 tablet by mouth every 6 (six) hours as needed. 12 tablet 0  . LORazepam (ATIVAN) 0.5 MG tablet Take 1 tablet (0.5 mg) by mouth four times a day as needed for anxiety or sleep 120 tablet 0  . Melatonin 10 MG TABS Take 20 mg by mouth at bedtime.    . rivaroxaban (XARELTO) 20 MG TABS tablet Take 1 tablet (20 mg total) by mouth daily with supper. 30 tablet 11  . rOPINIRole (REQUIP) 2 MG tablet TAKE 1 AND 1/2 TABLETS(3 MG) BY MOUTH AT BEDTIME 135  tablet 0  . lamoTRIgine (LAMICTAL) 150 MG tablet Take 75 mg by mouth daily.  (Patient not taking: Reported on 08/04/2020)     No current facility-administered medications for this visit.    Medication Side Effects: None unless nausea  Allergies: No Known Allergies  Past Medical History:  Diagnosis Date  . AAA (abdominal aortic aneurysm) (HCC)    3.1 cm 07/08/18, 3 year follow-up recommended; possible 3 cm AAA by aortogram 09/13/18  . Anemia    PMH  . Appendicitis with abscess    07/08/18, s/p perc drain; resolved 07/30/18 by CT  . Arthritis   . Bipolar disorder ([place])   . Cerebrovascular disease    intra and extracranial vascular dx per MRI 4/11, neurology rec strict CVRF control  . Colonic inertia   . Constipation    chronic;severe  . Coronary artery disease   . Depression   . Duodenitis   . EKG abnormalities    changes, stress test neg (false EKG changes)  . Gastritis   . GERD (gastroesophageal reflux disease)   . Hypertension   . Hypothyroid 01/16/2014  . Meningioma ([place]) 10/21/2013  . PAD (peripheral artery disease) ([place])   . Psoriasis    sees derm  . Stroke ([place])   . Wears dentures   . Wears glasses     Family History  Problem Relation Age of Onset  . Breast cancer Mother        metastisis to bones  . Diabetes Son   . Heart disease Other        grandfather   . Alcohol abuse Brother   . Heart disease Brother   . Heart disease Maternal Aunt   . Lung cancer Brother        smoked  . Colon cancer Neg Hx   . Esophageal cancer Neg Hx   . Stomach cancer Neg Hx     Social History   Socioeconomic History  . Marital status: Widowed    Spouse name: Not on file  . Number of children: 1  . Years of education: Not on file  . Highest education level: Not on file  Occupational History  . Occupation: retired  Tobacco Use  . Smoking status: Former Smoker    Packs/day: 0.75    Years: 60.00    Pack years: 45.00    Types: Cigarettes    Quit date: 11/10/2019     Years since quitting: 0.7  . Smokeless tobacco: Never Used  Vaping Use  .  Vaping Use: Former  Substance and Sexual Activity  . Alcohol use: Yes    Alcohol/week: 0.0 standard drinks    Comment: yes on occassion  . Drug use: No  . Sexual activity: Not Currently  Other Topics Concern  . Not on file  Social History Narrative   Brother in law Mr Rexford Maus (one of my patients)   Lives w/ husband       Social Determinants of Health   Financial Resource Strain:   . Difficulty of Paying Living Expenses: Not on file  Food Insecurity:   . Worried About Charity fundraiser in the Last Year: Not on file  . Ran Out of Food in the Last Year: Not on file  Transportation Needs:   . Lack of Transportation (Medical): Not on file  . Lack of Transportation (Non-Medical): Not on file  Physical Activity:   . Days of Exercise per Week: Not on file  . Minutes of Exercise per Session: Not on file  Stress:   . Feeling of Stress : Not on file  Social Connections:   . Frequency of Communication with Friends and Family: Not on file  . Frequency of Social Gatherings with Friends and Family: Not on file  . Attends Religious Services: Not on file  . Active Member of Clubs or Organizations: Not on file  . Attends Archivist Meetings: Not on file  . Marital Status: Not on file  Intimate Partner Violence:   . Fear of Current or Ex-Partner: Not on file  . Emotionally Abused: Not on file  . Physically Abused: Not on file  . Sexually Abused: Not on file    Past Medical History, Surgical history, Social history, and Family history were reviewed and updated as appropriate.   Please see review of systems for further details on the patient's review from today.   Objective:   Physical Exam:  There were no vitals taken for this visit.  Physical Exam Neurological:     Mental Status: She is alert and oriented to person, place, and time.     Cranial Nerves: No dysarthria.  Psychiatric:         Attention and Perception: She is inattentive. She does not perceive auditory hallucinations.        Mood and Affect: Mood is anxious and depressed.        Speech: Speech normal. Speech is not rapid and pressured or slurred.        Behavior: Behavior is not slowed or aggressive. Behavior is cooperative.        Thought Content: Thought content normal. Thought content is not paranoid or delusional. Thought content does not include homicidal or suicidal ideation. Thought content does not include homicidal or suicidal plan.        Cognition and Memory: Memory is impaired. She exhibits impaired recent memory.     Comments:   Still depressed, anxious and less irritable.  Insight and judgment are fair Talkative Gets meds mixed up at times giving the same SE for mirtazapine and lorazepam     Lab Review:     Component Value Date/Time   NA 135 01/02/2020 1602   K 4.5 01/02/2020 1602   CL 98 01/02/2020 1602   CO2 25 01/02/2020 1602   GLUCOSE 96 01/02/2020 1602   GLUCOSE 120 (H) 11/26/2019 0650   BUN 17 01/02/2020 1602   CREATININE 1.08 (H) 01/02/2020 1602   CREATININE 0.77 07/28/2015 1805   CALCIUM 9.9 01/02/2020 1602  PROT 7.9 01/02/2020 1602   ALBUMIN 4.2 01/02/2020 1602   AST 15 01/02/2020 1602   ALT 12 01/02/2020 1602   ALKPHOS 87 01/02/2020 1602   BILITOT 0.2 01/02/2020 1602   GFRNONAA 50 (L) 01/02/2020 1602   GFRAA 58 (L) 01/02/2020 1602       Component Value Date/Time   WBC 8.5 01/02/2020 1602   WBC 12.5 (H) 11/26/2019 0650   RBC 4.03 01/02/2020 1602   RBC 3.79 (L) 11/26/2019 0650   HGB 11.9 01/02/2020 1602   HCT 36.4 01/02/2020 1602   PLT 370 01/02/2020 1602   MCV 90 01/02/2020 1602   MCH 29.5 01/02/2020 1602   MCH 29.6 11/26/2019 0650   MCHC 32.7 01/02/2020 1602   MCHC 32.5 11/26/2019 0650   RDW 14.0 01/02/2020 1602   LYMPHSABS 2.7 01/02/2020 1602   MONOABS 0.4 11/23/2019 1957   EOSABS 0.1 01/02/2020 1602   BASOSABS 0.0 01/02/2020 1602    No results found for:  POCLITH, LITHIUM   No results found for: PHENYTOIN, PHENOBARB, VALPROATE, CBMZ   .res Assessment: Plan:    Major depressive disorder, recurrent episode, moderate (HCC)  Mild cognitive impairment  Delayed sleep phase syndrome  History of CVA (cerebrovascular accident)  Generalized anxiety disorder  Restless legs syndrome  [name] has some chronic depression and chronic irregular sleep patterns which have been resistant to treatment.  Her chronic depression and anxiety are complicated now by double grief of loss of her sister and now her husband.  She is lonely and mood affected by recent severe mood problems.    Restart lamotrigine at 25 mg and use the small tablets to try to help mood and make it easier to swallow.  Purpose to help mood.  She felt it helped.  Will leave duloxetine at 30 low dose.  DC mirtazapine DT SE  Cont counseling for grief, depression, anxiety, and health crises.   She stopped the donepezil bc GI upset.  Consider Exelon patch for cognition.  But defer this because she is not in a condition to start a new medication at this time.  If nausea persists get a second opinion.  Continue ropinirole 3 mg for RLS.  This is better controlled her restless legs.  Disc SE incl hallucinations and dizziness.   This high dosage appears medically necessary.  Call if this fails and will consider doing something with the clonazepam either increasing it or perhaps switching to another benzodiazepine which might have less cognitive problems.  Such as lorazepam  Emphasized importance and proper nutrition and sleep and protein.  Doesn't eat much meat bc constipation but will eat fish.  Discussed how protein is necessary for the proper response to depression medicines  FU 2  mos.  Lynder Parents, MD, DFAPA  Please see After Visit Summary for patient specific instructions.  Future Appointments  Date Time Provider [place]  08/12/2020  2:40 PM Elam Dutch, MD  [facility]  08/26/2020  3:00 PM Blanchie Serve, PhD [facility] None    No orders of the defined types were placed in this encounter.     -------------------------------

## 2020-08-10 ENCOUNTER — Other Ambulatory Visit: Payer: Self-pay

## 2020-08-10 DIAGNOSIS — F331 Major depressive disorder, recurrent, moderate: Secondary | ICD-10-CM

## 2020-08-10 MED ORDER — LAMOTRIGINE 25 MG PO TABS: ORAL_TABLET | ORAL | 1 refills | Status: DC

## 2020-08-10 NOTE — Telephone Encounter (Signed)
[facility] requesting clarification on Rx sent for Lamotrigine 25 mg tablets

## 2020-08-12 ENCOUNTER — Ambulatory Visit: Payer: Medicare Other | Admitting: Vascular Surgery

## 2020-08-17 ENCOUNTER — Telehealth: Payer: Self-pay | Admitting: Pulmonary Disease

## 2020-08-17 ENCOUNTER — Telehealth: Payer: Self-pay | Admitting: Psychiatry

## 2020-08-17 NOTE — Telephone Encounter (Signed)
[place] called because [facility] won't fill her Lamictal because they need clarification of the directions.  Please call them with this clarification so she can get her medication.  See previous note

## 2020-08-17 NOTE — Telephone Encounter (Signed)
I will call the patient to make appt

## 2020-08-17 NOTE — Telephone Encounter (Signed)
I have called the patient again and she is getting scheduled at [facility]

## 2020-08-17 NOTE — Telephone Encounter (Signed)
Spoke with pharmacist and clarified Lamictal Rx and they will get it filled for her.

## 2020-08-17 NOTE — Telephone Encounter (Signed)
Will route back to [facility] pool for follow-up as they have just recently spoken to the patient the patient has already been spoken to today by myself.  Patient does not want to obtain high-resolution CT chest at [facility] long.  Patient does not need a chest x-ray she needs a high-resolution CT chest -can we ensure this is scheduled at a different location?  Wyn Quaker, FNP

## 2020-08-17 NOTE — Telephone Encounter (Signed)
08/17/20  Received triage message.  Contacted patient.  Patient needs to be scheduled for high-resolution CT chest as previously ordered by Dr. Jenetta Downer  Patient needs to be scheduled for pulmonary function testing as previously ordered by Dr. Jenetta Downer  After these 2 tests are scheduled and patient need scheduled follow-up with Dr. Jenetta Downer  Patient has received COVID-19 vaccinations  Patient is also having difficulty with billing regarding her oxygen from [employer].  She was told by [employer] that she needs to pay $1800 and that they were going to challenge her credit.  They were also reporting that she needs to have follow-up with [place] and a chest x-ray.  We will route to patient care coordinators for follow-up and scheduling.  Plan: -Patient needs to be scheduled for a high-resolution CT chest -Patient needs to be scheduled for pulmonary function testing -Patient needs to be scheduled for follow-up with Dr. Jenetta Downer -We will also need to contact [employer] for further information regarding the patient in question.  Please contact Darlina Guys directly to further evaluate what is needed for the patient.  Also please notify Lenna Sciara that the patient is currently working with [place]

## 2020-08-17 NOTE — Telephone Encounter (Signed)
We will leave in triage as patient needs to be scheduled for pulmonary function testing.  Patient also needs to be scheduled for follow-up with Dr. Jenetta Downer after PFT and high-resolution CT chest.  Please also see previous documentation from last telephone note that [employer] needs to be contacted on behalf of the patient specifically [name] to further investigate what is needed for documentation so the patient does not incur a $1800 out-of-pocket expense for her oxygen  Jenny Quaker, FNP

## 2020-08-23 ENCOUNTER — Ambulatory Visit: Payer: Medicare Other | Admitting: Psychiatry

## 2020-08-23 ENCOUNTER — Telehealth: Payer: Self-pay | Admitting: *Deleted

## 2020-08-23 NOTE — Telephone Encounter (Signed)
Schedule AWV.  

## 2020-08-25 ENCOUNTER — Other Ambulatory Visit: Payer: Medicare Other

## 2020-08-26 ENCOUNTER — Ambulatory Visit (INDEPENDENT_AMBULATORY_CARE_PROVIDER_SITE_OTHER): Payer: Medicare Other | Admitting: Psychiatry

## 2020-08-26 DIAGNOSIS — F17219 Nicotine dependence, cigarettes, with unspecified nicotine-induced disorders: Secondary | ICD-10-CM

## 2020-08-26 DIAGNOSIS — Z8619 Personal history of other infectious and parasitic diseases: Secondary | ICD-10-CM | POA: Diagnosis not present

## 2020-08-26 DIAGNOSIS — Z8673 Personal history of transient ischemic attack (TIA), and cerebral infarction without residual deficits: Secondary | ICD-10-CM

## 2020-08-26 DIAGNOSIS — J84112 Idiopathic pulmonary fibrosis: Secondary | ICD-10-CM | POA: Diagnosis not present

## 2020-08-26 DIAGNOSIS — Z634 Disappearance and death of family member: Secondary | ICD-10-CM

## 2020-08-26 DIAGNOSIS — F331 Major depressive disorder, recurrent, moderate: Secondary | ICD-10-CM | POA: Diagnosis not present

## 2020-08-26 DIAGNOSIS — G3184 Mild cognitive impairment, so stated: Secondary | ICD-10-CM

## 2020-08-26 NOTE — Progress Notes (Signed)
Psychotherapy Progress Note [facility] Luan Moore, PhD LP  Patient ID: [name]     MRN: [id number] Therapy format: Individual psychotherapy Date: 08/26/2020      Start: 3:14p     Stop: 4:05p     Time Spent: 51 min Location: Telehealth visit -- I connected with this patient by an approved telecommunication method (video), with her informed consent, and verifying identity and patient privacy.  I was located at my office and patient at her home.  As needed, we discussed the limitations, risks, and security and privacy concerns associated with telehealth service, including the availability and conditions which currently govern in-person appointments and the possibility that 3rd-party payment may not be fully guaranteed and she may be responsible for charges.  After she indicated understanding, we proceeded with the session.  Also discussed treatment planning, as needed, including ongoing verbal agreement with the plan, the opportunity to ask and answer all questions, her demonstrated understanding of instructions, and her readiness to call the office should symptoms worsen or she feels she is in a crisis state and needs more immediate and tangible assistance.   Session narrative (presenting needs, interim history, self-report of stressors and symptoms, applications of prior therapy, status changes, and interventions made in session) Maybe looking forward to visiting family in [place] next week, but also c/o "getting paranoid", e.g., feeling BIL and his family turning on her, including alleged bitchiness from niece Anderson Malta, who alleged Pt was lying about her issue with the realtor whom she claims derelicted his duty and he successfully sued her for lost fees.  Disinvited to Thanksgiving with these family, BIL Nino eventually backed her up.  Notes she was asking Anderson Malta if she would witness for her, and apparently she felt intimidated herself, leapt to hostility in a way her  mother sometimes did.  Anyway, at this point cool about family, which kind of completes the loss of connections here.  Used to resonate a lot with Anderson Malta, feels a loss for having her turn so critical.  Also some loss for seeing her change her personality marrying the man she did, seemingly de-liberating quickly and turning caustic about many things.  Other hurts for seeing reminders of Suzie either shielded or wasted, e.g., some costume jewelry, and being prevented from looking through it for her own memory and interest.  Not trusting her own reflexes now behind the wheel, either.  Stays home alone most of the time.  Also says she is seeing figures in her vision sometimes that may resemble spiritual visions, abrupt moments when she sees shadowy figures.  Sound potentially delirious, possibly post-CVA effects.  They begin suddenly, in her peripheral vision, while full awake.  Admits she's having continued difficulty with delayed sleep cycle as well, now for years.  Often conks out in evening, wakes up "drooling" about 4am.  Last week lost a whole day.  No b.s. problems, but will have dizzy attacks that could be.  Says her BPs have been running OK, little reason to believe it's hypertensive or hypotensive crisis.  Admits she relapsed in smoking.  Moving to nicotine gum now.  Does have many attacks of low stamina, which makes sense in light of her ongoing COPD/chronic respiratory failure.  Also having pain consistent with another bladder infection, sometime burning with urination.  Otherwise, feeling a winter blues coming that corresponds to last year's fresh grief, and these days does miss H and S.  F died at 74yo, and she turns 53  in 2 months.  M died at 7, and she dreaded turning 2 when it was her turn.    Strongly recommended to go use O2 concentrator any time she feels dizzy, low stamina, or odd perceptual experiences, as a good experiment and a cheap, informative test of what the issue is.  Introduced to  relaxing breathing as well, with option to include a scatological breath prayer to express frustrations, relieve negative emotions.  Encouraged to go ahead and make the [place] trip next week.  Clear she needs the chance to get out of her house, out of her isolation, go see people who love her and she loves.  Just be sure to take all she needs for self-care.   Therapeutic modalities: Cognitive Behavioral Therapy, Solution-Oriented/Positive Psychology, Ego-Supportive and Psycho-education/Bibliotherapy  Mental Status/Observations:  Appearance:   Casual and worn     Behavior:  Appropriate  Motor:  Normal  Speech/Language:   Clear and Coherent  Affect:  Appropriate  Mood:  dysthymic and frustrated  Thought process:  normal  Thought content:    WNL  Sensory/Perceptual disturbances:    visual phenomena as reported  Orientation:  grossly intact  Attention:  Fair    Concentration:  Fair  Memory:  WNL  Insight:    Good  Judgment:   Fair  Impulse Control:  Fair   Risk Assessment: Danger to Self: No Self-injurious Behavior: No Danger to Others: No Physical Aggression / Violence: No Duty to Warn: No Access to Firearms a concern: No  Assessment of progress:  situational setback(s)  Diagnosis:   ICD-10-CM   1. Major depressive disorder, recurrent episode, moderate (HCC)  F33.1   2. Mild cognitive impairment  G31.84   3. Nicotine dependence, cigarettes, with unspecified nicotine-induced disorders  F17.219   4. Bereavement  Z63.4   5. History of sepsis (recent, in long-term recovery)  Z86.19   6. History of CVA (cerebrovascular accident)  Z86.73   7. Idiopathic pulmonary fibrosis (HCC) (per chart; pt self-describes COPD)  J84.112    Plan:  . Self-assess weakness and altered mental status by using oxygen concentrator first and seeing if it helps . Be wiling to assess for UTI . Maintain hydration . Stop smoking . Try to call off the evening earlier and go to bed, arrange good light in  the morning to help wake on appropriate biological time . Other recommendations/advice as may be noted above . Continue to utilize previously learned skills ad lib . Maintain medication as prescribed and work faithfully with relevant prescriber(s) if any changes are desired or seem indicated . Call the clinic on-call service, present to ER, or call 911 if any life-threatening psychiatric crisis Return in about 1 month (around 09/26/2020) for will call, time as available. . Already scheduled visit in this office 10/11/2020.  Blanchie Serve, PhD Luan Moore, PhD LP Clinical Psychologist, [facility] [address] [place],  [address] [phone]

## 2020-08-31 ENCOUNTER — Other Ambulatory Visit: Payer: Medicare Other

## 2020-09-14 ENCOUNTER — Other Ambulatory Visit (HOSPITAL_COMMUNITY): Payer: Medicare Other

## 2020-09-16 ENCOUNTER — Inpatient Hospital Stay: Admission: RE | Admit: 2020-09-16 | Payer: Medicare Other | Source: Ambulatory Visit

## 2020-09-20 ENCOUNTER — Other Ambulatory Visit: Payer: Medicare Other

## 2020-10-05 ENCOUNTER — Other Ambulatory Visit: Payer: Medicare Other

## 2020-10-06 ENCOUNTER — Other Ambulatory Visit: Payer: Medicare Other

## 2020-10-11 ENCOUNTER — Telehealth (INDEPENDENT_AMBULATORY_CARE_PROVIDER_SITE_OTHER): Payer: Medicare Other | Admitting: Psychiatry

## 2020-10-11 ENCOUNTER — Encounter: Payer: Self-pay | Admitting: Psychiatry

## 2020-10-11 DIAGNOSIS — G4721 Circadian rhythm sleep disorder, delayed sleep phase type: Secondary | ICD-10-CM | POA: Diagnosis not present

## 2020-10-11 DIAGNOSIS — G2581 Restless legs syndrome: Secondary | ICD-10-CM | POA: Diagnosis not present

## 2020-10-11 DIAGNOSIS — F411 Generalized anxiety disorder: Secondary | ICD-10-CM

## 2020-10-11 DIAGNOSIS — F331 Major depressive disorder, recurrent, moderate: Secondary | ICD-10-CM

## 2020-10-11 DIAGNOSIS — G3184 Mild cognitive impairment, so stated: Secondary | ICD-10-CM

## 2020-10-11 DIAGNOSIS — Z634 Disappearance and death of family member: Secondary | ICD-10-CM

## 2020-10-11 NOTE — Progress Notes (Signed)
[name] [id number] 30-Jul-1946 75 y.o.   Virtual Visit via WebEx  I connected with pt by audio and visual WebEx app and verified that I am speaking with the correct person using two identifiers.   I discussed the limitations, risks, security and privacy concerns of performing an evaluation and management service by t WebEx and the availability of in person appointments. I also discussed with the patient that there may be a patient responsible charge related to this service. The patient expressed understanding and agreed to proceed.  I discussed the assessment and treatment plan with the patient. The patient was provided an opportunity to ask questions and all were answered. The patient agreed with the plan and demonstrated an understanding of the instructions.   The patient was advised to call back or seek an in-person evaluation if the symptoms worsen or if the condition fails to improve as anticipated.  I provided 30 minutes of non-face-to-face time during this encounter. The call started at 400 and ended at 4:30 o'clock. The patient was located at home and the provider was located office.   Subjective:   Patient ID:  [name] is a 75 y.o. (DOB 07-Dec-1945) female.  Chief Complaint:  Chief Complaint  Patient presents with  . Follow-up  . Depression  . Anxiety    Depression        Associated symptoms include decreased concentration and fatigue.  Associated symptoms include no suicidal ideas.  Past medical history includes anxiety.   Anxiety Symptoms include decreased concentration, dizziness, nausea, nervous/anxious behavior and shortness of breath. Patient reports no confusion, palpitations or suicidal ideas.     SHARMAINE BAIN presents to the office today for follow-up of depression and anxiety and MCI.    at visit January 09, 2019.  She was having problems with restless legs and ropinirole was increased to 3 mg every afternoon.  There were no other med changes.  visit  Feb 05, 2019.  She was having mixture of depression and anxiety and chronic insomnia with delayed sleep phase.  For the reasons mentioned at the last visit she was started on risperidone 0.25 mg nightly.  She was maintained on duloxetine 90 mg, clonazepam 1 mg nightly, lamotrigine 150 twice daily, and ropinirole 2 mg 1-1/2 tablets nightly for restless legs.  visit September 2020.  She had stopped the risperidone because she only took it a few days.  She felt nausea and spaced out on it.  No other meds were changed.  Last visit December 2020.  The following was noted: Doing very poorly.  Not getting dressed and not out of the house for 5 days.  Hard to walk with pain.  Falling apart overnight.  Feet hurt at night.  Worries over poor circulation to toes.  Placed on blood thinners.  Continued health problems with COPD.  Energy is low and hard time walking.  Arthritis in back.  House on the market and that's stressful.  Sx largely unchanged but "it's circumstances".  Planning to move to [facility] but ambivalent.  Brother in law lives next door and wants her to stay here.  Also stressed over politics and social things.   Had period of crippling nausea and cut all meds back in 1/2.  Thinks it's mental issue.  Nausea is some better with dose reduction but is more depressed.  Plan: Use pill box to improve compliance. DT nausea  split meds but increase duloxetine 30 mg BID and lamotrigine 75 mg BID. She's  been on lower dose DT nausea but is more depressed.  January 02, 2020 appointment, the following is noted: She got septic with a UTI and then was admitted to [facility] for re[facility]. Home now.  Getting stronger.  Having to take nausea meds. Also diarrhea.  Would like to be on higher dose of meds but can't tolerate it.  Still on some prednisone.  Pending cardiology with CHF.  Also pulmonology. Sleep is pretty good with meds.   No meds changed.  04/26/20 appt with the following noted:  Missed a lot of med DT  nausea.  Back on duloxetine 60 and lamotrigine 75 mg for a week.  Was more depressed and dizzy off the meds. Nausea comes and goes in waves but could be any time of day but maybe worse in the evening. Eats poorly usually.  If feels bad doesn't want to cook.   Pepcid helps nausea.  Zofran doesn't help. Lately more trouble falling asleep.  Plan: Rec trial mirtazapine 30 mg HS to help with depression and potentially nausea hopefully.  Nausea is interfering with depression treatment.  08/04/20 appt with following noted: Took mirtazapine 30 mg for a week and felt like a zombie with jitteriness outside.  Didn't help nausea much.  Noticing a pattern with nausea related to constipation and bowel problems.  Not ready to take lamotrigine bc it's hard to swallow but felt emotionally better when on it.  Wonders if bipolar bc jumps from topic to topic and talks a lot. Wants to stay on clonazepam to help her sleep.  Aware of memory concerns. Not taking lorazepam but not sure why she isn't.  Thinks it doesn't work as well as clonazepam.  Says she's taking clonazepam at night now. Plan;  Restart lamotrigine at 25 mg and use the small tablets to try to help mood and make it easier to swallow.  Purpose to help mood.  She felt it helped. Will leave duloxetine at 30 low dose. DC mirtazapine DT SE  10/11/2020 appointment with the following noted:  Started and stopped lamotrigine bc not sticking with schedule so just up to lamotrigine 50 mg daily. RLS requires ropinirole.  Sometimes forgets it and has to keep moving.    Periods of being horrible condition with depression but not suicidal.  Hasnt' been that bad lately.  Still grieving [name] and her sister.  Taking a long time.  Watch too much TV.  Sister [name] was her best friend.   Sometimes feels her presence. No energy to move back to [place] but son encourages it.  Procrastinates it bc moving is overwhelming.  Eating OK.  Enjoyed seeing son and gkids.   Nausea  better but comes and goes.  RLS managed.  Sleep pretty good, except irregular hours.Pt reports that mood is Depressed worse at night and describes anxiety as worse and moderate in the same dose for depression but comes and goes.  She wonders if she might be bipolar like her sister.She is also having problems with irritability. Anxiety symptoms include: Excessive Worry, Panic Symptoms,. Pt reports sleep is more regular and consistent with melatonin 10 mg nightly.. Pt reports that appetite is decreased. Pt reports that energy is poor and loss of interest or pleasure in usual activities, poor motivation and withdrawn from usual activities. Concentration is scattered.  Suicidal thoughts:  denied by patient. B In Law next door.    Increased ropinirole helped RLS.  Her sister had bipolar disorder.  Past Psychiatric Medication Trials: Lithium 300  insomnia, Depakote, olanzapine 2.5,  lamotrigine 300, ropinirole as high as 2 mg 3 times daily for restless legs,  Aricept poorly tolerated,  Abilify side effects,  fluoxetine, sertraline, paroxetine, Lexapro, duloxetine 90,  Mirtazapine 30 zombie Donepezil SE Long psychiatric history.  Sister bipolar. I have never prescribed lorazepam nor Xanax  Review of Systems:  Review of Systems  Constitutional: Positive for fatigue.  Respiratory: Positive for shortness of breath.   Cardiovascular: Negative for palpitations.       Claudication   Gastrointestinal: Positive for nausea. Negative for anal bleeding.  Neurological: Positive for dizziness and weakness. Negative for tremors.  Psychiatric/Behavioral: Positive for decreased concentration, depression, dysphoric mood and sleep disturbance. Negative for agitation, behavioral problems, confusion, hallucinations, self-injury and suicidal ideas. The patient is nervous/anxious. The patient is not hyperactive.    LEG surgery for claudication Jan 3  Medications: I have reviewed the patient's current  medications.  Current Outpatient Medications  Medication Sig Dispense Refill  . amLODipine (NORVASC) 2.5 MG tablet Take 1 tablet (2.5 mg total) by mouth daily.    . clonazePAM (KLONOPIN) 0.5 MG tablet Take 1 tablet (0.5 mg total) by mouth 2 (two) times daily as needed for anxiety. 60 tablet 1  . DULoxetine (CYMBALTA) 30 MG capsule TAKE 1 CAPSULE(30 MG) BY MOUTH EVERY EVENING (Patient taking differently: Take 30 mg by mouth daily.) 90 capsule 1  . famotidine (PEPCID) 20 MG tablet One at bedtime 30 tablet 11  . HYDROcodone-acetaminophen (NORCO) 5-325 MG tablet Take 1 tablet by mouth every 6 (six) hours as needed. 12 tablet 0  . lamoTRIgine (LAMICTAL) 25 MG tablet 1 daily for 2 weeks, then 2 daily for 2 weeks, then 4 daily. 120 tablet 1  . Melatonin 10 MG TABS Take 20 mg by mouth at bedtime.    . rivaroxaban (XARELTO) 20 MG TABS tablet Take 1 tablet (20 mg total) by mouth daily with supper. 30 tablet 11  . rOPINIRole (REQUIP) 2 MG tablet TAKE 1 AND 1/2 TABLETS(3 MG) BY MOUTH AT BEDTIME 135 tablet 0   No current facility-administered medications for this visit.    Medication Side Effects: None unless nausea  Allergies: No Known Allergies  Past Medical History:  Diagnosis Date  . AAA (abdominal aortic aneurysm) (HCC)    3.1 cm 07/08/18, 3 year follow-up recommended; possible 3 cm AAA by aortogram 09/13/18  . Anemia    PMH  . Appendicitis with abscess    07/08/18, s/p perc drain; resolved 07/30/18 by CT  . Arthritis   . Bipolar disorder ([place])   . Cerebrovascular disease    intra and extracranial vascular dx per MRI 4/11, neurology rec strict CVRF control  . Colonic inertia   . Constipation    chronic;severe  . Coronary artery disease   . Depression   . Duodenitis   . EKG abnormalities    changes, stress test neg (false EKG changes)  . Gastritis   . GERD (gastroesophageal reflux disease)   . Hypertension   . Hypothyroid 01/16/2014  . Meningioma ([place]) 10/21/2013  . PAD (peripheral  artery disease) ([place])   . Psoriasis    sees derm  . Stroke ([place])   . Wears dentures   . Wears glasses     Family History  Problem Relation Age of Onset  . Breast cancer Mother        metastisis to bones  . Diabetes Son   . Heart disease Other        grandfather   .  Alcohol abuse Brother   . Heart disease Brother   . Heart disease Maternal Aunt   . Lung cancer Brother        smoked  . Colon cancer Neg Hx   . Esophageal cancer Neg Hx   . Stomach cancer Neg Hx     Social History   Socioeconomic History  . Marital status: Widowed    Spouse name: Not on file  . Number of children: 1  . Years of education: Not on file  . Highest education level: Not on file  Occupational History  . Occupation: retired  Tobacco Use  . Smoking status: Former Smoker    Packs/day: 0.75    Years: 60.00    Pack years: 45.00    Types: Cigarettes    Quit date: 11/10/2019    Years since quitting: 0.9  . Smokeless tobacco: Never Used  Vaping Use  . Vaping Use: Former  Substance and Sexual Activity  . Alcohol use: Yes    Alcohol/week: 0.0 standard drinks    Comment: yes on occassion  . Drug use: No  . Sexual activity: Not Currently  Other Topics Concern  . Not on file  Social History Narrative   Brother in law Mr Rexford Maus (one of my patients)   Lives w/ husband       Social Determinants of Health   Financial Resource Strain: Not on file  Food Insecurity: Not on file  Transportation Needs: Not on file  Physical Activity: Not on file  Stress: Not on file  Social Connections: Not on file  Intimate Partner Violence: Not on file    Past Medical History, Surgical history, Social history, and Family history were reviewed and updated as appropriate.   Please see review of systems for further details on the patient's review from today.   Objective:   Physical Exam:  There were no vitals taken for this visit.  Physical Exam Neurological:     Mental Status: She is alert and  oriented to person, place, and time.     Cranial Nerves: No dysarthria.  Psychiatric:        Attention and Perception: She is inattentive. She does not perceive auditory hallucinations.        Mood and Affect: Mood is anxious and depressed.        Speech: Speech normal. Speech is not rapid and pressured or slurred.        Behavior: Behavior is not slowed or aggressive. Behavior is cooperative.        Thought Content: Thought content normal. Thought content is not paranoid or delusional. Thought content does not include homicidal or suicidal ideation. Thought content does not include homicidal or suicidal plan.        Cognition and Memory: Memory is impaired. She exhibits impaired recent memory.     Comments:   Still depressed, anxious and less irritable.  Insight and judgment are fair Talkative Gets meds mixed up at times      Lab Review:     Component Value Date/Time   NA 135 01/02/2020 1602   K 4.5 01/02/2020 1602   CL 98 01/02/2020 1602   CO2 25 01/02/2020 1602   GLUCOSE 96 01/02/2020 1602   GLUCOSE 120 (H) 11/26/2019 0650   BUN 17 01/02/2020 1602   CREATININE 1.08 (H) 01/02/2020 1602   CREATININE 0.77 07/28/2015 1805   CALCIUM 9.9 01/02/2020 1602   PROT 7.9 01/02/2020 1602   ALBUMIN 4.2 01/02/2020 1602  AST 15 01/02/2020 1602   ALT 12 01/02/2020 1602   ALKPHOS 87 01/02/2020 1602   BILITOT 0.2 01/02/2020 1602   GFRNONAA 50 (L) 01/02/2020 1602   GFRAA 58 (L) 01/02/2020 1602       Component Value Date/Time   WBC 8.5 01/02/2020 1602   WBC 12.5 (H) 11/26/2019 0650   RBC 4.03 01/02/2020 1602   RBC 3.79 (L) 11/26/2019 0650   HGB 11.9 01/02/2020 1602   HCT 36.4 01/02/2020 1602   PLT 370 01/02/2020 1602   MCV 90 01/02/2020 1602   MCH 29.5 01/02/2020 1602   MCH 29.6 11/26/2019 0650   MCHC 32.7 01/02/2020 1602   MCHC 32.5 11/26/2019 0650   RDW 14.0 01/02/2020 1602   LYMPHSABS 2.7 01/02/2020 1602   MONOABS 0.4 11/23/2019 1957   EOSABS 0.1 01/02/2020 1602   BASOSABS  0.0 01/02/2020 1602    No results found for: POCLITH, LITHIUM   No results found for: PHENYTOIN, PHENOBARB, VALPROATE, CBMZ   .res Assessment: Plan:    Major depressive disorder, recurrent episode, moderate (HCC)  Mild cognitive impairment  Bereavement  Generalized anxiety disorder  Restless legs syndrome  Delayed sleep phase syndrome  [name] has some chronic depression and chronic irregular sleep patterns which have been resistant to treatment.  Her chronic depression and anxiety are complicated now by double grief of loss of her sister and now her husband.  She is lonely and mood affected by recent severe mood problems.    Continue lamotrigine increase to 100 mg as recommended and use the small tablets to try to help mood and make it easier to swallow.  Purpose to help mood.  She felt it helped.  Will leave duloxetine at 30 low dose. No other med changes  Cont counseling for grief, depression, anxiety, and health crises.   She stopped the donepezil bc GI upset.  Consider Exelon patch for cognition.  But defer this because she is not in a condition to start a new medication at this time.  If nausea persists get a second opinion.  Continue ropinirole 3 mg for RLS.  This is better controlled her restless legs.  Disc SE incl hallucinations and dizziness.   This high dosage appears medically necessary.  Call if this fails and will consider doing something with the clonazepam either increasing it or perhaps switching to another benzodiazepine which might have less cognitive problems.  Such as lorazepam  Emphasized importance and proper nutrition and sleep and protein.  Doesn't eat much meat bc constipation but will eat fish.  Discussed how protein is necessary for the proper response to depression medicines  FU 3 mos.  Lynder Parents, MD, DFAPA  Please see After Visit Summary for patient specific instructions.  Future Appointments  Date Time Provider [place]   10/13/2020  3:00 PM [facility] PFT ROOM [facility] None  10/13/2020  4:00 PM Olalere, Adewale A, MD [facility] None  10/20/2020  4:00 PM GI-315 CT 1 GI-315CT [facility]    No orders of the defined types were placed in this encounter.     -------------------------------

## 2020-10-13 ENCOUNTER — Telehealth: Payer: Self-pay | Admitting: Pulmonary Disease

## 2020-10-13 ENCOUNTER — Ambulatory Visit: Payer: Medicare Other | Admitting: Pulmonary Disease

## 2020-10-14 NOTE — Telephone Encounter (Signed)
Spoke with Darlina Guys with [employer] and she states that pt needs a new face to face with qualifying walk and new O2 order. Pt has upcoming appt on 10/19/20 and have added this to her appt notes. I left a detailed message (per DPR) for pt of above information and left the office call back number for any further questions.

## 2020-10-14 NOTE — Telephone Encounter (Signed)
Attempted to contact pt. Left message for pt to call back to dicuss issues with [employer] .  I have also left a message for Darlina Guys to call regarding issues that were discussed in telephone note 08/17/20.

## 2020-10-15 ENCOUNTER — Telehealth: Payer: Self-pay | Admitting: Pulmonary Disease

## 2020-10-15 ENCOUNTER — Other Ambulatory Visit (HOSPITAL_COMMUNITY)
Admission: RE | Admit: 2020-10-15 | Discharge: 2020-10-15 | Disposition: A | Discharge status: Home / Self Care | Payer: Medicare Other | Source: Ambulatory Visit | Attending: Pulmonary Disease | Admitting: Pulmonary Disease

## 2020-10-15 DIAGNOSIS — Z20822 Contact with and (suspected) exposure to covid-19: Secondary | ICD-10-CM | POA: Insufficient documentation

## 2020-10-15 DIAGNOSIS — Z01812 Encounter for preprocedural laboratory examination: Secondary | ICD-10-CM | POA: Diagnosis not present

## 2020-10-15 NOTE — Telephone Encounter (Signed)
Patient was returning [name]'s call from yesterday in regards to her O2. She is aware that she will need to be walked during his OV next week to prove that she still needs her O2 so [employer] can send the information to her insurance company.   She was also wanting to know how to remove the account from collections. She stated that she was in the hospital for about a month and her family did not take care of finances as she requested. Once she was released from the hospital, it took several months for her to recover financially. I advised her that I would reach out to [facility] from [employer] to see if they could remove the collection remark and work with her in regards to a payment plan. She verbalized understanding.   Community message has been sent to [employer].

## 2020-10-16 LAB — SARS CORONAVIRUS 2 (TAT 6-24 HRS): SARS Coronavirus 2: NEGATIVE

## 2020-10-18 IMAGING — DX DG CHEST 2V
2 series · 2 of 2 positions shown · non-contrast
Comparison: 11/21/2019, 11/10/2019

CLINICAL DATA: Shortness of breath

EXAM:
CHEST - 2 VIEW

[chest pa]
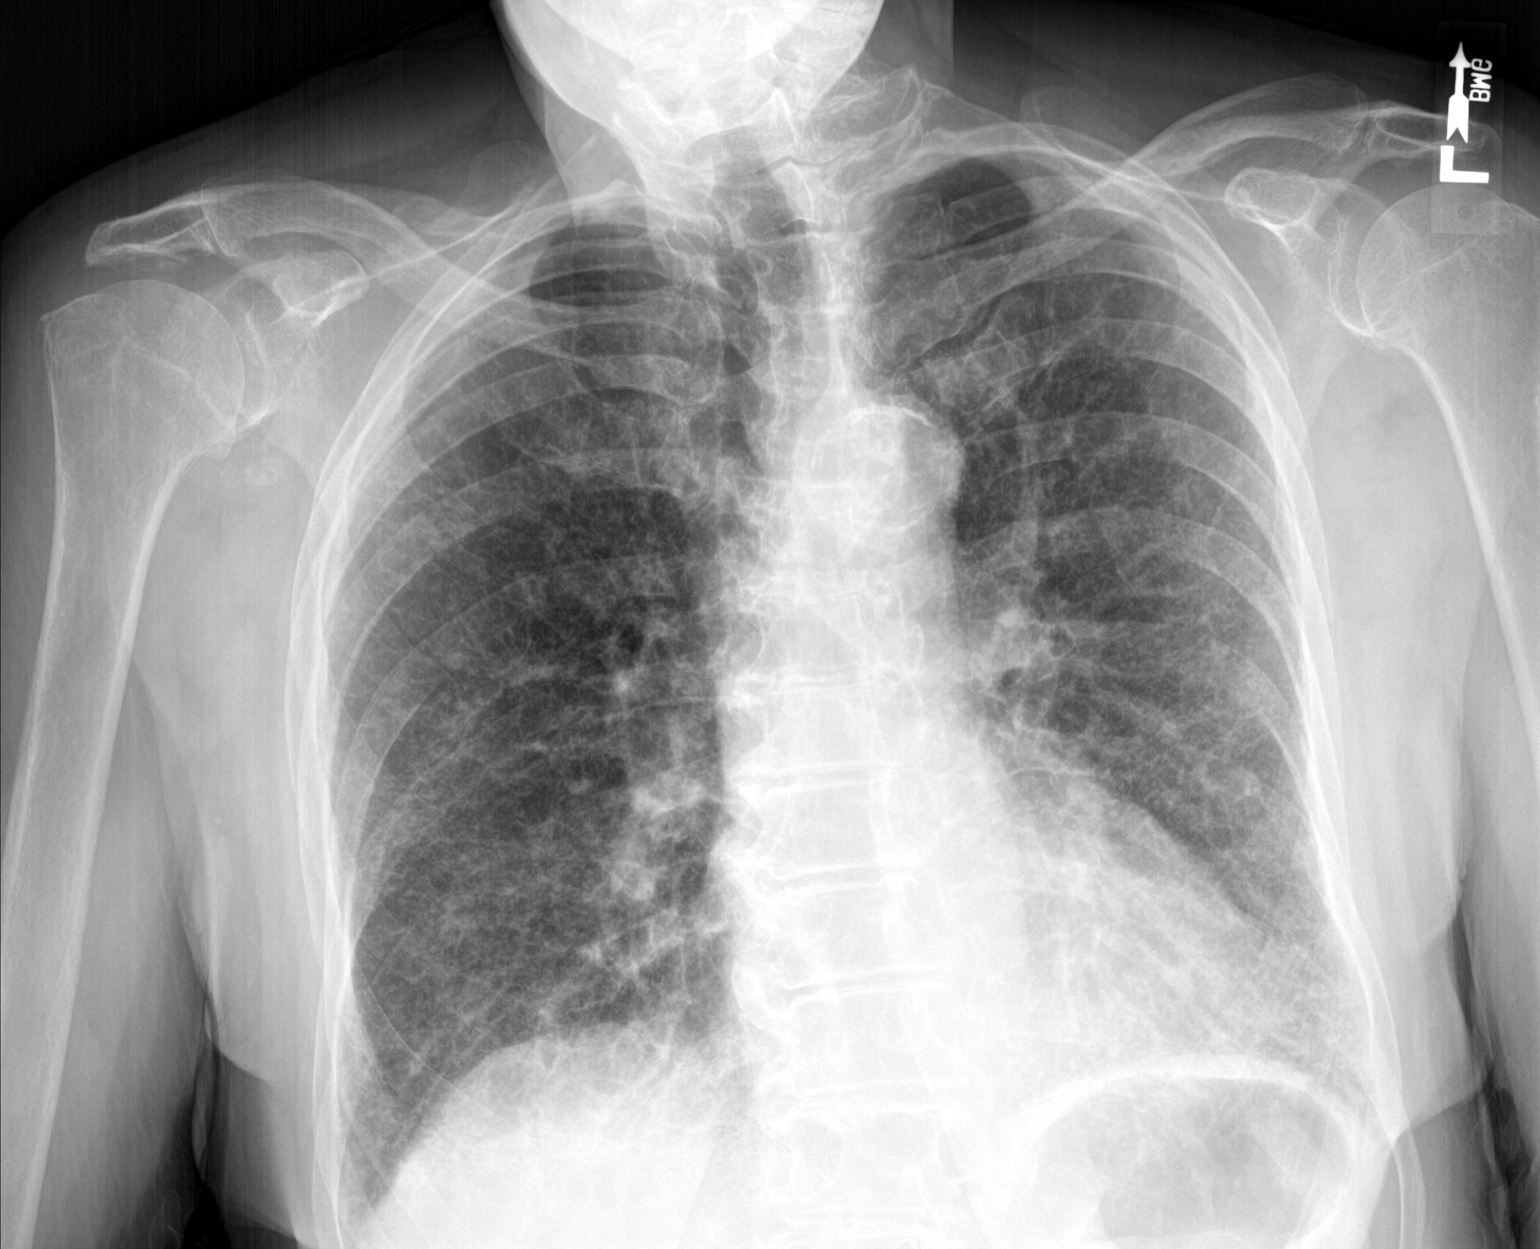

[chest lat]
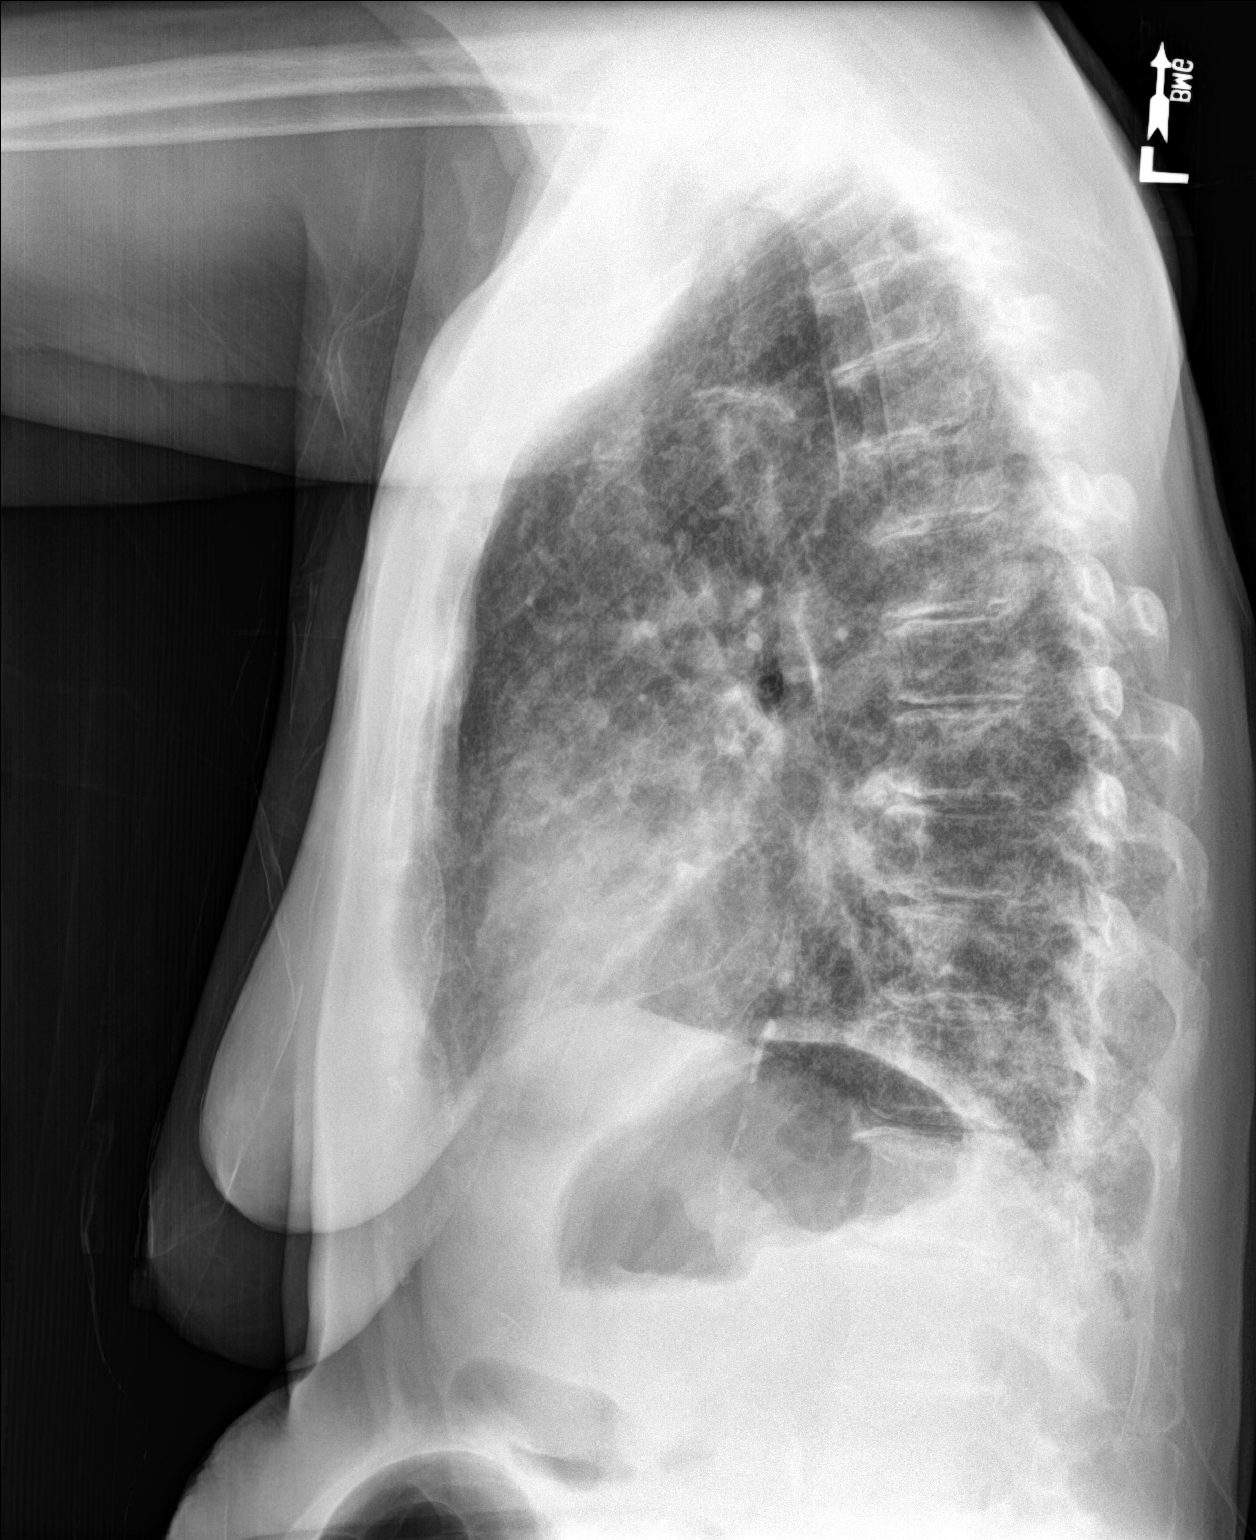

[2 of 2 positions shown; findings below may reference images not displayed]

FINDINGS: Stable cardiomediastinal contours. Atherosclerotic calcification of
the aorta. Chronically coarsened interstitial markings bilaterally.
Previously seen diffuse bilateral airspace opacities have largely
resolved. There is mild patchy left basilar airspace opacity. No
pleural effusion. No pneumothorax.
IMPRESSION: 1. Mild patchy airspace opacity at the left lung base could reflect
atelectasis or infiltrate. Previously seen diffuse bilateral
airspace opacities from 11/21/2019 have largely resolved.
2. Background of chronic interstitial lung disease.

## 2020-10-19 ENCOUNTER — Ambulatory Visit (INDEPENDENT_AMBULATORY_CARE_PROVIDER_SITE_OTHER): Payer: Medicare Other | Admitting: Pulmonary Disease

## 2020-10-19 ENCOUNTER — Ambulatory Visit (INDEPENDENT_AMBULATORY_CARE_PROVIDER_SITE_OTHER): Payer: Medicare Other | Admitting: Primary Care

## 2020-10-19 ENCOUNTER — Other Ambulatory Visit: Payer: Self-pay | Admitting: Psychiatry

## 2020-10-19 ENCOUNTER — Other Ambulatory Visit: Payer: Self-pay

## 2020-10-19 ENCOUNTER — Encounter: Payer: Self-pay | Admitting: Primary Care

## 2020-10-19 VITALS — BP 140/88 | HR 78 | Temp 97.3°F | Ht 66.0 in | Wt 170.0 lb

## 2020-10-19 DIAGNOSIS — J432 Centrilobular emphysema: Secondary | ICD-10-CM | POA: Insufficient documentation

## 2020-10-19 DIAGNOSIS — J8410 Pulmonary fibrosis, unspecified: Secondary | ICD-10-CM

## 2020-10-19 DIAGNOSIS — J84112 Idiopathic pulmonary fibrosis: Secondary | ICD-10-CM | POA: Diagnosis not present

## 2020-10-19 DIAGNOSIS — J9611 Chronic respiratory failure with hypoxia: Secondary | ICD-10-CM | POA: Diagnosis not present

## 2020-10-19 DIAGNOSIS — J9621 Acute and chronic respiratory failure with hypoxia: Secondary | ICD-10-CM | POA: Diagnosis not present

## 2020-10-19 LAB — PULMONARY FUNCTION TEST
DL/VA % pred: 71 %
DL/VA: 2.86 ml/min/mmHg/L
DLCO cor % pred: 64 %
DLCO cor: 13.36 ml/min/mmHg
DLCO unc % pred: 64 %
DLCO unc: 13.36 ml/min/mmHg
FEF 25-75 Post: 2.76 L/sec
FEF 25-75 Pre: 2.32 L/sec
FEF2575-%Change-Post: 18 %
FEF2575-%Pred-Post: 161 %
FEF2575-%Pred-Pre: 136 %
FEV1-%Change-Post: 5 %
FEV1-%Pred-Post: 104 %
FEV1-%Pred-Pre: 98 %
FEV1-Post: 2.39 L
FEV1-Pre: 2.26 L
FEV1FVC-%Change-Post: 1 %
FEV1FVC-%Pred-Pre: 108 %
FEV6-%Change-Post: 2 %
FEV6-%Pred-Post: 98 %
FEV6-%Pred-Pre: 95 %
FEV6-Post: 2.84 L
FEV6-Pre: 2.76 L
FEV6FVC-%Change-Post: -2 %
FEV6FVC-%Pred-Post: 102 %
FEV6FVC-%Pred-Pre: 105 %
FVC-%Change-Post: 3 %
FVC-%Pred-Post: 95 %
FVC-%Pred-Pre: 91 %
FVC-Post: 2.91 L
FVC-Pre: 2.8 L
Post FEV1/FVC ratio: 82 %
Post FEV6/FVC ratio: 98 %
Pre FEV1/FVC ratio: 81 %
Pre FEV6/FVC Ratio: 100 %
RV % pred: 148 %
RV: 3.65 L
TLC % pred: 116 %
TLC: 6.31 L

## 2020-10-19 MED ORDER — ALBUTEROL SULFATE HFA 108 (90 BASE) MCG/ACT IN AERS: 1.0000 | INHALATION_SPRAY | Freq: Four times a day (QID) | RESPIRATORY_TRACT | 2 refills | Status: DC | PRN

## 2020-10-19 NOTE — Patient Instructions (Signed)
Pulmonary function testing showed no evidence of obstructive or restrictive lung disease. You do have emphysema on imaging. Mild diffusion defect.   Wear 2L oxygen during moderate exertion   You are scheduled for CT scan of your chest tomorrow  Rx: Albuterol 2 puffs every 6 hours as needed for shortness of breath/wheezing  Orders: Renew oxygen with [employer]  Follow-up: 1-2 months with Dr. Ander Slade after CT chest

## 2020-10-19 NOTE — Assessment & Plan Note (Addendum)
-   Moderate centrilobular emphysema on imaging - Normal spirometry without BD response. Mild diffusion defect - Rx albuterol 2 puffs q6 hours prn shortness of breath or wheezing.  - May consider adding LAMA in the future if needed  - Refer to pulmonary rehab

## 2020-10-19 NOTE — Progress Notes (Signed)
PFT done today. 

## 2020-10-19 NOTE — Assessment & Plan Note (Addendum)
-   Onset in 2017 after viral illness. CT chest in May 2017 consistent with UIP.  - She is scheduled for high resolution CT chest tomorrow

## 2020-10-19 NOTE — Progress Notes (Signed)
@Patient  ID: [name], female    DOB: 1946-04-21, 75 y.o.   MRN: [id number]  Chief Complaint  Patient presents with  . Follow-up    Referring provider: [name], MD  HPI: 75 year old female, former smoker. PMH significant for postinflammatory pulmonary fibrosis, centrilobular emphysema, chronic respiratory failure with hypoxia, diastolic heart failure, coronary artery disease, hypertension, hyperlipidemia, mild cognitive impairment, history CVA.  Patient of Dr. Ander Slade, last seen in office on 05/05/2020.  Previous [facility] pulmonary encounter: 05/05/20 Patient with history of shortness of breath cough. She recollects being told about concern for fibrosis about 4 months ago. She had seen Dr. Melvyn Novas in 2017-documentation did indicate concern for pulmonary fibrosis at the time  Recently quit smoking about a month ago, at the worst was smoking a pack a day  Shortness of breath on exertion Still tired with an exercise limit of couple of blocks at most Used to be able to walk couple of miles  Denies any chest pains or chest discomfort Still does have an occasional cough She does have limitations related to hip and joint pains  Complicated recovery from a vascular procedure  No recurrent skin rash No history of inflammatory bowel process No swallowing difficulty  10/19/2020- Interim hx  Patient presents today for follow-up with PFTs. Accompanied by female family member. She experiences shortness of breath walking up 1 flight of stairs or with moderate exertion. She typically recovers with rest. Patient was having billing issues with [employer].  Per [name] patient needs qualifying walk and new order. She is scheduled for HRCT tomorrow to monitor ILD. Denies f/c/s, chest tightness or wheezing  Pulmonary function testing 10/19/2020 FVC 2.91 (95%), FEV1 2.39 (104%), ratio 82, DLCO unc 13.36 (64%) / Normal spirometry without bronchodilator response. Mild diffusion defect. Evidence of air  trapping.   No Known Allergies  Immunization History  Administered Date(s) Administered  . PFIZER(Purple Top)SARS-COV-2 Vaccination 02/20/2020, 03/12/2020  . Pneumococcal Polysaccharide-23 10/24/2018  . Zoster 08/15/2010    Past Medical History:  Diagnosis Date  . AAA (abdominal aortic aneurysm) (HCC)    3.1 cm 07/08/18, 3 year follow-up recommended; possible 3 cm AAA by aortogram 09/13/18  . Anemia    PMH  . Appendicitis with abscess    07/08/18, s/p perc drain; resolved 07/30/18 by CT  . Arthritis   . Bipolar disorder ([place])   . Cerebrovascular disease    intra and extracranial vascular dx per MRI 4/11, neurology rec strict CVRF control  . Colonic inertia   . Constipation    chronic;severe  . Coronary artery disease   . Depression   . Duodenitis   . EKG abnormalities    changes, stress test neg (false EKG changes)  . Gastritis   . GERD (gastroesophageal reflux disease)   . Hypertension   . Hypothyroid 01/16/2014  . Meningioma ([place]) 10/21/2013  . PAD (peripheral artery disease) ([place])   . Psoriasis    sees derm  . Stroke ([place])   . Wears dentures   . Wears glasses     Tobacco History: Social History   Tobacco Use  Smoking Status Former Smoker  . Packs/day: 0.75  . Years: 60.00  . Pack years: 45.00  . Types: Cigarettes  . Quit date: 11/10/2019  . Years since quitting: 0.9  Smokeless Tobacco Never Used   Counseling given: Not Answered   Outpatient Medications Prior to Visit  Medication Sig Dispense Refill  . amLODipine (NORVASC) 2.5 MG tablet Take 1 tablet (2.5  mg total) by mouth daily.    . clonazePAM (KLONOPIN) 0.5 MG tablet Take 1 tablet (0.5 mg total) by mouth 2 (two) times daily as needed for anxiety. 60 tablet 1  . DULoxetine (CYMBALTA) 30 MG capsule TAKE 1 CAPSULE(30 MG) BY MOUTH EVERY EVENING (Patient taking differently: Take 30 mg by mouth daily.) 90 capsule 1  . famotidine (PEPCID) 20 MG tablet One at bedtime 30 tablet 11  . HYDROcodone-acetaminophen  (NORCO) 5-325 MG tablet Take 1 tablet by mouth every 6 (six) hours as needed. 12 tablet 0  . lamoTRIgine (LAMICTAL) 25 MG tablet 1 daily for 2 weeks, then 2 daily for 2 weeks, then 4 daily. 120 tablet 1  . Melatonin 10 MG TABS Take 20 mg by mouth at bedtime.    . rivaroxaban (XARELTO) 20 MG TABS tablet Take 1 tablet (20 mg total) by mouth daily with supper. 30 tablet 11  . rOPINIRole (REQUIP) 2 MG tablet TAKE 1 AND 1/2 TABLETS(3 MG) BY MOUTH AT BEDTIME 135 tablet 0   No facility-administered medications prior to visit.    Review of Systems  Review of Systems  Constitutional: Negative.   Respiratory: Positive for cough.        DOE  Cardiovascular: Negative.     Physical Exam  BP 140/88 (BP Location: Left Arm, Cuff Size: Normal)   Pulse 78   Temp (!) 97.3 F (36.3 C) (Oral)   Ht 5\' 6"  (1.676 m)   Wt 170 lb (77.1 kg)   SpO2 94%   BMI 27.44 kg/m  Physical Exam Constitutional:      Appearance: Normal appearance.  HENT:     Head: Normocephalic and atraumatic.     Mouth/Throat:     Comments: Deferred d/t masking Cardiovascular:     Rate and Rhythm: Normal rate and regular rhythm.  Pulmonary:     Breath sounds: Rales present.     Comments: Velcro crackles through lung bases  Skin:    General: Skin is warm and dry.  Neurological:     General: No focal deficit present.     Mental Status: She is alert and oriented to person, place, and time. Mental status is at baseline.  Psychiatric:        Mood and Affect: Mood normal.        Behavior: Behavior normal.        Thought Content: Thought content normal.        Judgment: Judgment normal.      Lab Results:  CBC    Component Value Date/Time   WBC 8.5 01/02/2020 1602   WBC 12.5 (H) 11/26/2019 0650   RBC 4.03 01/02/2020 1602   RBC 3.79 (L) 11/26/2019 0650   HGB 11.9 01/02/2020 1602   HCT 36.4 01/02/2020 1602   PLT 370 01/02/2020 1602   MCV 90 01/02/2020 1602   MCH 29.5 01/02/2020 1602   MCH 29.6 11/26/2019 0650    MCHC 32.7 01/02/2020 1602   MCHC 32.5 11/26/2019 0650   RDW 14.0 01/02/2020 1602   LYMPHSABS 2.7 01/02/2020 1602   MONOABS 0.4 11/23/2019 1957   EOSABS 0.1 01/02/2020 1602   BASOSABS 0.0 01/02/2020 1602    BMET    Component Value Date/Time   NA 135 01/02/2020 1602   K 4.5 01/02/2020 1602   CL 98 01/02/2020 1602   CO2 25 01/02/2020 1602   GLUCOSE 96 01/02/2020 1602   GLUCOSE 120 (H) 11/26/2019 0650   BUN 17 01/02/2020 1602   CREATININE 1.08 (  H) 01/02/2020 1602   CREATININE 0.77 07/28/2015 1805   CALCIUM 9.9 01/02/2020 1602   GFRNONAA 50 (L) 01/02/2020 1602   GFRAA 58 (L) 01/02/2020 1602    BNP    Component Value Date/Time   BNP 330.3 (H) 11/20/2019 0346    ProBNP    Component Value Date/Time   PROBNP 263 03/10/2019 1438    Imaging: No results found.   Assessment & Plan:   Centrilobular emphysema (HCC) - Moderate centrilobular emphysema on imaging - Normal spirometry without BD response. Mild diffusion defect - Rx albuterol 2 puffs q6 hours prn shortness of breath or wheezing.  - May consider adding LAMA in the future if needed  - Refer to pulmonary rehab   Postinflammatory pulmonary fibrosis ([place]) - Onset in 2017 after viral illness. CT chest in May 2017 consistent with UIP.  - She is scheduled for high resolution CT chest tomorrow  Chronic respiratory failure with hypoxia ([place]) - Re-qualified for oxygen today  - Renew oxygen order with [facility], NP 10/19/2020

## 2020-10-19 NOTE — Assessment & Plan Note (Addendum)
-   Re-qualified for oxygen today  - Renew oxygen order with [employer]

## 2020-10-20 ENCOUNTER — Ambulatory Visit
Admission: RE | Admit: 2020-10-20 | Discharge: 2020-10-20 | Disposition: A | Discharge status: Home / Self Care | Payer: Medicare Other | Source: Ambulatory Visit | Attending: Pulmonary Disease | Admitting: Pulmonary Disease

## 2020-10-20 DIAGNOSIS — J432 Centrilobular emphysema: Secondary | ICD-10-CM | POA: Diagnosis not present

## 2020-10-20 DIAGNOSIS — J84112 Idiopathic pulmonary fibrosis: Secondary | ICD-10-CM

## 2020-10-20 DIAGNOSIS — J9809 Other diseases of bronchus, not elsewhere classified: Secondary | ICD-10-CM | POA: Diagnosis not present

## 2020-10-20 NOTE — Telephone Encounter (Signed)
I checked my community messages and I have not received one for this patient. Will close encounter. Once I have received a message, I will create a new encounter.

## 2020-10-21 ENCOUNTER — Telehealth: Payer: Self-pay | Admitting: Primary Care

## 2020-10-21 NOTE — Telephone Encounter (Signed)
See other phone note from today 10/21/20. Derl Barrow, NP, has already spoken to pt. Will close encounter.

## 2020-10-21 NOTE — Telephone Encounter (Signed)
Called and spoke with [name] from [facility] in regards to call from 2/3 when she stated pt needed to have as face-to-face visit and [place] walk for O2. Stated to [facility] that pt did come to office 2/8 and pt was also walked at that office and the [facility] note has been updated with this walk info.  [name] stated to send this info to coordinators so then they could send it to the team and that way all of pt's documents could be pulled so they could then get her account cleared. Order was placed after pt's OV 2/8.  Routing to [employer].

## 2020-10-21 NOTE — Telephone Encounter (Signed)
High copay with albuterol but didn't realize she got a 90 day supply  She will get the RX   States [employer] won't accept the insurance approval for oxygen device.  Another company will be contacting the office for approving it.   Patient would like Benjamine Mola to call her in regards to this matter please

## 2020-10-21 NOTE — Telephone Encounter (Signed)
Patient called and states that [employer] told her insurance is no longer covering her oxygen. She was seen on 10/20/19 and re-qualified for oxygen. Can we please look into this for her. Thank you

## 2020-10-22 NOTE — Telephone Encounter (Signed)
I can send in Albuterol RX. I wont be able to call her today as I am in clinic, please have either my nurse or triage help assist her

## 2020-10-22 NOTE — Telephone Encounter (Signed)
LMTCB

## 2020-10-22 NOTE — Telephone Encounter (Signed)
I have sent high priority message to [employer] notifying them of ov and qualifying walk on 2/8.

## 2020-10-22 NOTE — Telephone Encounter (Signed)
Message received from [facility] stating they have all of the documentation.  The POC would be private pay because they have been servicing the pt with O2 since 11/2019.  Their asset recovery dept is working on fixing the billing for the pt.  Nothing further needed from [place].

## 2020-10-25 ENCOUNTER — Telehealth: Payer: Self-pay | Admitting: Pulmonary Disease

## 2020-10-26 NOTE — Telephone Encounter (Addendum)
Spoke to [name] at [name].  Pt has been on O2 with them since 11/2019.  Due to this she would not qualify for poc & would have to be private pay. They only have poc's for new O2 pt's.  [name] is going to research further & make sure that's all that's going on & will call me back with update.

## 2020-10-26 NOTE — Telephone Encounter (Signed)
Spoke to [place].  Situation is as stated in previous message.  Due to pt having O2 with them since 11/2019 insurance has already pd majority of payment and they will not get reimbursed for poc.  If she wants this she will have to be private pay.  I called pt & had to leave a vm for her to call me back.

## 2020-10-28 NOTE — Telephone Encounter (Signed)
Pt called me back.  She is very upset.  Her issue is not necessarily the poc - she has a 1750.00 bill from [employer] & is being turned over to collections.  She has ins and she has talked to them & they told her they will pay.  She had to come to our office & had walk test.  She states has a hard time talking to anyone at [facility].  I told her I would talk to [facility] & me or her one would call her back.  I saw in previous note that Denny Peon told me on 2/11 recovery dept was working on fixing bill.  I left vm for [name] to call me.

## 2020-10-29 ENCOUNTER — Telehealth: Payer: Self-pay | Admitting: Pulmonary Disease

## 2020-10-29 NOTE — Telephone Encounter (Signed)
Spoke to [place] and discussed pt's concerns.  She states issue seems to be pt got O2 before the face to face visit but everything should be taken care of.  She is going to call pt today and make her aware.  Nothing further needed from [place].

## 2020-10-29 NOTE — Telephone Encounter (Signed)
Called Michael with [employer] and left him a vm letting him know we can not give out info to him regarding pt without her calling and letting us know she wants order sent to them.  Gave him my phone # if he has any other questions and gave him office # to have pt to call & let triage nurse know she is wanting order sent to them for O2.  Nothing further needed for this message.

## 2020-11-05 ENCOUNTER — Ambulatory Visit (INDEPENDENT_AMBULATORY_CARE_PROVIDER_SITE_OTHER): Payer: Medicare Other | Admitting: Psychiatry

## 2020-11-05 DIAGNOSIS — Z8673 Personal history of transient ischemic attack (TIA), and cerebral infarction without residual deficits: Secondary | ICD-10-CM

## 2020-11-05 DIAGNOSIS — J84112 Idiopathic pulmonary fibrosis: Secondary | ICD-10-CM

## 2020-11-05 DIAGNOSIS — Z634 Disappearance and death of family member: Secondary | ICD-10-CM

## 2020-11-05 DIAGNOSIS — G3184 Mild cognitive impairment, so stated: Secondary | ICD-10-CM

## 2020-11-05 DIAGNOSIS — G4721 Circadian rhythm sleep disorder, delayed sleep phase type: Secondary | ICD-10-CM

## 2020-11-05 DIAGNOSIS — G8929 Other chronic pain: Secondary | ICD-10-CM

## 2020-11-05 DIAGNOSIS — Z8619 Personal history of other infectious and parasitic diseases: Secondary | ICD-10-CM

## 2020-11-05 DIAGNOSIS — F411 Generalized anxiety disorder: Secondary | ICD-10-CM

## 2020-11-05 DIAGNOSIS — F17219 Nicotine dependence, cigarettes, with unspecified nicotine-induced disorders: Secondary | ICD-10-CM

## 2020-11-05 DIAGNOSIS — F332 Major depressive disorder, recurrent severe without psychotic features: Secondary | ICD-10-CM

## 2020-11-05 DIAGNOSIS — M5442 Lumbago with sciatica, left side: Secondary | ICD-10-CM

## 2020-11-05 NOTE — Progress Notes (Addendum)
Psychotherapy Progress Note [facility] Luan Moore, PhD LP  Patient ID: [name]     MRN: [id number] Therapy format: Individual psychotherapy Date: 11/05/2020      Start: n/a  Stop: n/a Time Spent: 0 min Location: MyChart video  No show for 11am, did not answer phone call at 11:15.  Left message to call back to start late or reschedule, no word received as of noon.  Historically good chance she slept through a morning appointment.  Received word 4pm that she had developed stomach flu, sorry for the miss, rescheduled 2 weeks out.  Staff informed of much earlier openings, but wants to guarantee recovery first.  Jenny Serve, PhD Luan Moore, PhD LP Clinical Psychologist, [facility] [address], [place], [place] [address] [phone]

## 2020-11-18 ENCOUNTER — Other Ambulatory Visit: Payer: Self-pay | Admitting: Nurse Practitioner

## 2020-11-19 ENCOUNTER — Ambulatory Visit (INDEPENDENT_AMBULATORY_CARE_PROVIDER_SITE_OTHER): Payer: Medicare Other | Admitting: Psychiatry

## 2020-11-19 DIAGNOSIS — G3184 Mild cognitive impairment, so stated: Secondary | ICD-10-CM

## 2020-11-19 DIAGNOSIS — F332 Major depressive disorder, recurrent severe without psychotic features: Secondary | ICD-10-CM

## 2020-11-19 NOTE — Progress Notes (Signed)
Admin note for non-service contact  Patient ID: [name]  MRN: [id number] DATE: 11/19/2020  No show for 3pm.  Converted to video on working understanding that she meant this, and not seen in the waiting room .  No answer to two calls to her number made at 3:20p and 3:45p.  Voice mails left.  Blanchie Serve, PhD Luan Moore, PhD LP Clinical Psychologist, [facility] [address] [place], [place] [address] [phone]

## 2020-11-24 ENCOUNTER — Other Ambulatory Visit: Payer: Self-pay

## 2020-11-24 ENCOUNTER — Telehealth: Payer: Self-pay | Admitting: Gastroenterology

## 2020-11-24 MED ORDER — LINACLOTIDE 145 MCG PO CAPS: 145.0000 ug | ORAL_CAPSULE | Freq: Every day | ORAL | 1 refills | Status: DC

## 2020-11-24 NOTE — Telephone Encounter (Signed)
Pt is requesting a call back from a nurse, pt did not disclose any further information

## 2020-11-24 NOTE — Telephone Encounter (Signed)
Thanks [employer].  Do we know why her Linzess was denied, is her insurance not covering it?  Please refill this if she needs it and insurance can cover it.  If insurance will not cover it we can discuss other options. thanks

## 2020-11-24 NOTE — Telephone Encounter (Signed)
Spoke with patient, she states that her bowels are not the same since she has not taken the [place]. She states that Linzess helps and she has been out of Linzess for 3-4 days now, refill was denied on 11/18/20. Patient has been scheduled for a follow up on Tuesday, 12/28/20 at 3:20 PM, patient requested an afternoon appointment. She would like Linzess refilled. Please advise, thanks.

## 2020-11-24 NOTE — Telephone Encounter (Signed)
Linzess refilled and patient has been notified.

## 2020-11-29 ENCOUNTER — Ambulatory Visit: Payer: Medicare Other | Admitting: Pulmonary Disease

## 2020-12-17 ENCOUNTER — Ambulatory Visit (INDEPENDENT_AMBULATORY_CARE_PROVIDER_SITE_OTHER): Payer: Medicare Other | Admitting: Psychiatry

## 2020-12-17 DIAGNOSIS — Z8673 Personal history of transient ischemic attack (TIA), and cerebral infarction without residual deficits: Secondary | ICD-10-CM

## 2020-12-17 DIAGNOSIS — F332 Major depressive disorder, recurrent severe without psychotic features: Secondary | ICD-10-CM | POA: Diagnosis not present

## 2020-12-17 DIAGNOSIS — I739 Peripheral vascular disease, unspecified: Secondary | ICD-10-CM | POA: Diagnosis not present

## 2020-12-17 DIAGNOSIS — Z634 Disappearance and death of family member: Secondary | ICD-10-CM

## 2020-12-17 DIAGNOSIS — J84112 Idiopathic pulmonary fibrosis: Secondary | ICD-10-CM

## 2020-12-17 DIAGNOSIS — G3184 Mild cognitive impairment, so stated: Secondary | ICD-10-CM

## 2020-12-17 DIAGNOSIS — Z8619 Personal history of other infectious and parasitic diseases: Secondary | ICD-10-CM | POA: Diagnosis not present

## 2020-12-17 DIAGNOSIS — G4721 Circadian rhythm sleep disorder, delayed sleep phase type: Secondary | ICD-10-CM | POA: Diagnosis not present

## 2020-12-17 DIAGNOSIS — R69 Illness, unspecified: Secondary | ICD-10-CM | POA: Diagnosis not present

## 2020-12-17 DIAGNOSIS — F17219 Nicotine dependence, cigarettes, with unspecified nicotine-induced disorders: Secondary | ICD-10-CM | POA: Diagnosis not present

## 2020-12-17 NOTE — Progress Notes (Signed)
Psychotherapy Progress Note [facility] Luan Moore, PhD LP  Patient ID: [name]     MRN: [id number] Therapy format: Individual psychotherapy Date: 12/17/2020      Start: 4:20p     Stop: 5:10p     Time Spent: 50 min Location: Telehealth visit -- I connected with this patient by an approved telecommunication method (video), with her informed consent, and verifying identity and patient privacy.  I was located at my office and patient at her home.  As needed, we discussed the limitations, risks, and security and privacy concerns associated with telehealth service, including the availability and conditions which currently govern in-person appointments and the possibility that 3rd-party payment may not be fully guaranteed and she may be responsible for charges.  After she indicated understanding, we proceeded with the session.  Also discussed treatment planning, as needed, including ongoing verbal agreement with the plan, the opportunity to ask and answer all questions, her demonstrated understanding of instructions, and her readiness to call the office should symptoms worsen or she feels she is in a crisis state and needs more immediate and tangible assistance.   Session narrative (presenting needs, interim history, self-report of stressors and symptoms, applications of prior therapy, status changes, and interventions made in session) Unable to come in person today -- procrastinated driver's license, let it lapse, now has to go in person.  Good-natured today, but c/o depression and, more notably, hearing "aggressive" sounds like a bulldozer trying to tear down her garage.  Not sure if real, paranormal experience, or hallucination.  Happened close to sleep, listened close, went outside, nothing there.  Also claims a lot of "company" from departed souls, in her home, 5 or 6 different people, mix of relatives (e.g., [name]) and strangers (a woman who got up in her face).  Wonders if  she's going crazy somehow, but she has always believed in supernatural phenomena, usually in the service of still keeping touch with loved ones.    Interpreted the garage noise as a hypnagogic hallucination, not psychosis.  Some chance it is related to hx of CVA.  Acknowledges she spends a lot of time thinking about deceased loved ones, primarily sister Dola Argyle and husband [name].  Cites a "presence" in the house since she fist moved in years ago, and she would feel a discomfort that [name] would also feel.  They came to make fun of the spirit, understood it to be a Native American from some time before the subdivision was there, maybe infringing on a burial ground.  Not alarmed by it, just curious..   Knee is giving her more trouble, figures she probably needs TKR, but with sepsis last year, ongoing COPD, and CVA hx, she is probably not a candidate for going under.  No impression of any relapsing UTIs, but c/o possible infection nearby, sharp jabs of pain in rectal, pelvic area.  Pulmonology on Monday.  Probably should get a pelvic exam.  Needs GI visit but angry that she did not get a colon exam last time.  Reports long hx of very itchy anus, vagina joining it recently (yeast?).  Back having bilateral pain, along with these jabs of pain in fleshy parts below (caudal tail?).    Renewed recommendation to try her oxygen concentrator on if she feels fatigued, dizzy, low stamina, or odd perceptual experiences.  Last note -- granddaughter and family vacationed in [place] w/o offering opportunity to visit or join them.  Wrote to her to confront, unfortunately, and  has been ruminating about being rejected.  Support/empathy provided and reminded that these reactions can alienate people sh wants in her life.  Reports she is afraid of cremation.  Been thinking about her final wishes, wants her remains taken to a family burial area in [place] [facility]) and placed in a crypt.  [name] challenged her on the  expense and how he was going to need whatever money could come; she was offended.  Educated on the actual costs and trouble to preserve a body, comparative values people use about disposal of the body, and reframed as potential gift to family and to the environment to do something less ostentatious, especially in light of how her body will not be her home after death, just a place to go to focus on her.  Will reconsider.  Therapeutic modalities: Cognitive Behavioral Therapy, Solution-Oriented/Positive Psychology and Ego-Supportive  Mental Status/Observations:  Appearance:   Casual     Behavior:  Appropriate  Motor:  Normal  Speech/Language:   Clear and Coherent  Affect:  Appropriate  Mood:  irritable  Thought process:  normal  Thought content:    Ilusions  Sensory/Perceptual disturbances:    grossly intact  Orientation:  Fully oriented  Attention:  Good    Concentration:  Good  Memory:  WNL  Insight:    Fair  Judgment:   Good  Impulse Control:  Fair   Risk Assessment: Danger to Self: No Self-injurious Behavior: No Danger to Others: No Physical Aggression / Violence: No Duty to Warn: No Access to Firearms a concern: No  Assessment of progress:  stabilized  Diagnosis:   ICD-10-CM   1. Severe episode of recurrent major depressive disorder, without psychotic features ([place])  F33.2   2. 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  G31.84   3. Bereavement  Z63.4   4. History of sepsis (recent, in long-term recovery)  Z86.19   5. History of CVA (cerebrovascular accident)  Z86.73   6. 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 Nicotine dependence, cigarettes, with unspecified nicotine-induced disorders  F17.219   8. Idiopathic pulmonary fibrosis (HCC) (per chart; pt self-describes COPD)  J84.112   9. PVD (peripheral vascular disease) (HCC)  I73.9   10. Suspected conditions -- yeast infection, caudal tail syndrome  R69    Plan:  . Be willing to assess  pelvic pain with PCP or other suitable physician.  Possibly caudal tail syndrome rather than something infectious. . Secure legal ride for driver's license renewal [facility]?) . Notify if supernatural phenomena intensify, become more frankly frightening, or involve headache or other perceptual phenomena . Consult Dr. Clovis Pu re sleep phenomena.   . Reconsider burial wishes, have conversation with loved ones as moved . Other recommendations/advice as may be noted above . Continue to utilize previously learned skills ad lib . Maintain medication as prescribed and work faithfully with relevant prescriber(s) if any changes are desired or seem indicated . Call the clinic on-call service, present to ER, or call 911 if any life-threatening psychiatric crisis Return for time as available, will call. . Already scheduled visit in this office 01/03/2021.  Blanchie Serve, PhD Luan Moore, PhD LP Clinical Psychologist, [facility] [address], [place], [place] [address] [phone]

## 2020-12-20 ENCOUNTER — Ambulatory Visit: Payer: Medicare Other | Admitting: Pulmonary Disease

## 2020-12-28 ENCOUNTER — Ambulatory Visit: Payer: Medicare Other | Admitting: Gastroenterology

## 2021-01-03 ENCOUNTER — Telehealth (INDEPENDENT_AMBULATORY_CARE_PROVIDER_SITE_OTHER): Payer: Medicare Other | Admitting: Psychiatry

## 2021-01-03 ENCOUNTER — Encounter: Payer: Self-pay | Admitting: Psychiatry

## 2021-01-03 DIAGNOSIS — Z8673 Personal history of transient ischemic attack (TIA), and cerebral infarction without residual deficits: Secondary | ICD-10-CM

## 2021-01-03 DIAGNOSIS — Z634 Disappearance and death of family member: Secondary | ICD-10-CM

## 2021-01-03 DIAGNOSIS — F332 Major depressive disorder, recurrent severe without psychotic features: Secondary | ICD-10-CM | POA: Diagnosis not present

## 2021-01-03 DIAGNOSIS — G2581 Restless legs syndrome: Secondary | ICD-10-CM

## 2021-01-03 DIAGNOSIS — F331 Major depressive disorder, recurrent, moderate: Secondary | ICD-10-CM

## 2021-01-03 DIAGNOSIS — G4721 Circadian rhythm sleep disorder, delayed sleep phase type: Secondary | ICD-10-CM | POA: Diagnosis not present

## 2021-01-03 DIAGNOSIS — F411 Generalized anxiety disorder: Secondary | ICD-10-CM | POA: Diagnosis not present

## 2021-01-03 DIAGNOSIS — G3184 Mild cognitive impairment, so stated: Secondary | ICD-10-CM | POA: Diagnosis not present

## 2021-01-03 MED ORDER — DULOXETINE HCL 30 MG PO CPEP
30.0000 mg | ORAL_CAPSULE | Freq: Every day | ORAL | 1 refills | Status: DC
Start: 2021-01-03 — End: 2021-04-12

## 2021-01-03 MED ORDER — ROPINIROLE HCL 2 MG PO TABS: 2.0000 mg | ORAL_TABLET | Freq: Every evening | ORAL | 1 refills | Status: DC

## 2021-01-03 MED ORDER — LAMOTRIGINE 25 MG PO TABS: 50.0000 mg | ORAL_TABLET | Freq: Every day | ORAL | 1 refills | Status: DC

## 2021-01-03 NOTE — Progress Notes (Signed)
Jenny HakimCarol D Breault [id number] 12/17/1945 75 y.o.   Virtual Visit via WebEx  I connected with pt by audio and visual WebEx app and verified that I am speaking with the correct person using two identifiers.   I discussed the limitations, risks, security and privacy concerns of performing an evaluation and management service by t WebEx and the availability of in person appointments. I also discussed with the patient that there may be a patient responsible charge related to this service. The patient expressed understanding and agreed to proceed.  I discussed the assessment and treatment plan with the patient. The patient was provided an opportunity to ask questions and all were answered. The patient agreed with the plan and demonstrated an understanding of the instructions.   The patient was advised to call back or seek an in-person evaluation if the symptoms worsen or if the condition fails to improve as anticipated.  I provided 30 minutes of non-face-to-face time during this encounter. The call started at 400 and ended at 4:30 o'clock. The patient was located at home and the provider was located office.   Subjective:   Patient ID:  [name] is a 75 y.o. (DOB 07/17/1946) female.  Chief Complaint:  Chief Complaint  Patient presents with  . Follow-up  . Major depressive disorder, recurrent episode, moderate (HCC)  . Anxiety  . Stress    Health.    Depression        Associated symptoms include decreased concentration and fatigue.  Associated symptoms include no suicidal ideas.  Past medical history includes anxiety.   Anxiety Symptoms include decreased concentration, dizziness, nausea, nervous/anxious behavior and shortness of breath. Patient reports no confusion, palpitations or suicidal ideas.     Jenny HakimCarol D Warnick presents to the office today for follow-up of depression and anxiety and MCI.    at visit January 09, 2019.  She was having problems with restless legs and ropinirole was  increased to 3 mg every afternoon.  There were no other med changes.  visit Feb 05, 2019.  She was having mixture of depression and anxiety and chronic insomnia with delayed sleep phase.  For the reasons mentioned at the last visit she was started on risperidone 0.25 mg nightly.  She was maintained on duloxetine 90 mg, clonazepam 1 mg nightly, lamotrigine 150 twice daily, and ropinirole 2 mg 1-1/2 tablets nightly for restless legs.  visit September 2020.  She had stopped the risperidone because she only took it a few days.  She felt nausea and spaced out on it.  No other meds were changed.  Last visit December 2020.  The following was noted: Doing very poorly.  Not getting dressed and not out of the house for 5 days.  Hard to walk with pain.  Falling apart overnight.  Feet hurt at night.  Worries over poor circulation to toes.  Placed on blood thinners.  Continued health problems with COPD.  Energy is low and hard time walking.  Arthritis in back.  House on the market and that's stressful.  Sx largely unchanged but "it's circumstances".  [place].  Brother in law lives next door and wants her to stay here.  Also stressed over politics and social things.   Had period of crippling nausea and cut all meds back in 1/2.  Thinks it's mental issue.  Nausea is some better with dose reduction but is more depressed.  Plan: Use pill box to improve compliance. DT nausea  split  meds but increase duloxetine 30 mg BID and lamotrigine 75 mg BID. She's been on lower dose DT nausea but is more depressed.  January 02, 2020 appointment, the following is noted: She got septic with a UTI and then was admitted to [facility]. Home now.  Getting stronger.  Having to take nausea meds. Also diarrhea.  Would like to be on higher dose of meds but can't tolerate it.  Still on some prednisone.  Pending cardiology with CHF.  Also pulmonology. Sleep is pretty good with meds.   No meds  changed.  04/26/20 appt with the following noted:  Missed a lot of med DT nausea.  Back on duloxetine 60 and lamotrigine 75 mg for a week.  Was more depressed and dizzy off the meds. Nausea comes and goes in waves but could be any time of day but maybe worse in the evening. Eats poorly usually.  If feels bad doesn't want to cook.   Pepcid helps nausea.  Zofran doesn't help. Lately more trouble falling asleep.  Plan: Rec trial mirtazapine 30 mg HS to help with depression and potentially nausea hopefully.  Nausea is interfering with depression treatment.  08/04/20 appt with following noted: Took mirtazapine 30 mg for a week and felt like a zombie with jitteriness outside.  Didn't help nausea much.  Noticing a pattern with nausea related to constipation and bowel problems.  Not ready to take lamotrigine bc it's hard to swallow but felt emotionally better when on it.  Wonders if bipolar bc jumps from topic to topic and talks a lot. Wants to stay on clonazepam to help her sleep.  Aware of memory concerns. Not taking lorazepam but not sure why she isn't.  Thinks it doesn't work as well as clonazepam.  Says she's taking clonazepam at night now. Plan;  Restart lamotrigine at 25 mg and use the small tablets to try to help mood and make it easier to swallow.  Purpose to help mood.  She felt it helped. Will leave duloxetine at 30 low dose. DC mirtazapine DT SE  10/11/2020 appointment with the following noted:  Started and stopped lamotrigine bc not sticking with schedule so just up to lamotrigine 50 mg daily. RLS requires ropinirole.  Sometimes forgets it and has to keep moving.    Periods of being horrible condition with depression but not suicidal.  Hasnt' been that bad lately.  Still grieving [name] and her sister.  Taking a long time.  Watch too much TV.  Sister [name] was her best friend.   Sometimes feels her presence. No energy to move back to [place] but son encourages it.  Procrastinates it bc  moving is overwhelming.  Eating OK.  Enjoyed seeing son and gkids.   Nausea better but comes and goes. Plan: Continue lamotrigine increase to 100 mg as recommended and use the small tablets to try to help mood and make it easier to swallow.  Purpose to help mood.  She felt it helped.  01/03/2021 appointment with following noted: Up some times and down some times.  Manages.  Kind of average. Not as depressed as in the past.  Still getting dizzy easily.  Sleep hours getting worse instead of bettter.  Once of twice weekly stays up all night and then naps.  Doesn't work out well for her body. Still tends to nausea but variable regardless of what she takes.  Most days consistent with lamotrigine.  Easier to swallow the small lamotrigine 25 vs 100 mg  tablets. Don't feel the same since the Sepsis. Going to get driver's license renewed tomorrow.  RLS managed with ropinirole.   Sleep pretty good, except irregular hours.Pt reports that mood is Depressed worse at night and describes anxiety as worse and moderate in the same dose for depression but comes and goes.  She wonders if she might be bipolar like her sister.She is also having problems with irritability. Anxiety symptoms include: Excessive Worry, Panic Symptoms,. Pt reports sleep is more regular and consistent with melatonin 10 mg nightly.. Pt reports that appetite is decreased. Pt reports that energy is poor and loss of interest or pleasure in usual activities, poor motivation and withdrawn from usual activities. Concentration is scattered.  Suicidal thoughts:  denied by patient. B In Law next door.    Increased ropinirole helped RLS.  Her sister had bipolar disorder.  Past Psychiatric Medication Trials: Lithium 300 insomnia, Depakote, olanzapine 2.5,  lamotrigine 300, ropinirole as high as 2 mg 3 times daily for restless legs,  Aricept poorly tolerated,  Abilify side effects,  fluoxetine, sertraline, paroxetine, Lexapro, duloxetine 90,   Mirtazapine 30 zombie Donepezil SE Long psychiatric history.  Sister bipolar. I have never prescribed lorazepam nor Xanax  Review of Systems:  Review of Systems  Constitutional: Positive for fatigue.  Respiratory: Positive for shortness of breath.   Cardiovascular: Negative for palpitations.       Claudication   Gastrointestinal: Positive for nausea. Negative for anal bleeding.  Neurological: Positive for dizziness and weakness. Negative for tremors.  Psychiatric/Behavioral: Positive for decreased concentration, depression, dysphoric mood and sleep disturbance. Negative for agitation, behavioral problems, confusion, hallucinations, self-injury and suicidal ideas. The patient is nervous/anxious. The patient is not hyperactive.    LEG surgery for claudication Jan 3  Medications: I have reviewed the patient's current medications.  Current Outpatient Medications  Medication Sig Dispense Refill  . albuterol (VENTOLIN HFA) 108 (90 Base) MCG/ACT inhaler Inhale 1-2 puffs into the lungs every 6 (six) hours as needed for wheezing or shortness of breath. 18 g 2  . amLODipine (NORVASC) 2.5 MG tablet Take 1 tablet (2.5 mg total) by mouth daily.    . clonazePAM (KLONOPIN) 0.5 MG tablet Take 1 tablet (0.5 mg total) by mouth 2 (two) times daily as needed for anxiety. 60 tablet 1  . DULoxetine (CYMBALTA) 30 MG capsule TAKE 1 CAPSULE(30 MG) BY MOUTH EVERY EVENING 90 capsule 1  . famotidine (PEPCID) 20 MG tablet One at bedtime 30 tablet 11  . lamoTRIgine (LAMICTAL) 25 MG tablet 1 daily for 2 weeks, then 2 daily for 2 weeks, then 4 daily. (Patient taking differently: 4 daily.) 120 tablet 1  . linaclotide (LINZESS) 145 MCG CAPS capsule Take 1 capsule (145 mcg total) by mouth daily before breakfast. 30 capsule 1  . Melatonin 10 MG TABS Take 20 mg by mouth at bedtime.    . rivaroxaban (XARELTO) 20 MG TABS tablet Take 1 tablet (20 mg total) by mouth daily with supper. 30 tablet 11  . rOPINIRole (REQUIP) 2  MG tablet TAKE 1 AND 1/2 TABLETS(3 MG) BY MOUTH AT BEDTIME 135 tablet 0  . HYDROcodone-acetaminophen (NORCO) 5-325 MG tablet Take 1 tablet by mouth every 6 (six) hours as needed. (Patient not taking: Reported on 01/03/2021) 12 tablet 0   No current facility-administered medications for this visit.    Medication Side Effects: None unless nausea  Allergies: No Known Allergies  Past Medical History:  Diagnosis Date  . AAA (abdominal aortic aneurysm) (HCC)  3.1 cm 07/08/18, 3 year follow-up recommended; possible 3 cm AAA by aortogram 09/13/18  . Anemia    PMH  . Appendicitis with abscess    07/08/18, s/p perc drain; resolved 07/30/18 by CT  . Arthritis   . Bipolar disorder ([place])   . Cerebrovascular disease    intra and extracranial vascular dx per MRI 4/11, neurology rec strict CVRF control  . Colonic inertia   . Constipation    chronic;severe  . Coronary artery disease   . Depression   . Duodenitis   . EKG abnormalities    changes, stress test neg (false EKG changes)  . Gastritis   . GERD (gastroesophageal reflux disease)   . Hypertension   . Hypothyroid 01/16/2014  . Meningioma ([place]) 10/21/2013  . PAD (peripheral artery disease) ([place])   . Psoriasis    sees derm  . Stroke ([place])   . Wears dentures   . Wears glasses     Family History  Problem Relation Age of Onset  . Breast cancer Mother        metastisis to bones  . Diabetes Son   . Heart disease Other        grandfather   . Alcohol abuse Brother   . Heart disease Brother   . Heart disease Maternal Aunt   . Lung cancer Brother        smoked  . Colon cancer Neg Hx   . Esophageal cancer Neg Hx   . Stomach cancer Neg Hx     Social History   Socioeconomic History  . Marital status: Widowed    Spouse name: Not on file  . Number of children: 1  . Years of education: Not on file  . Highest education level: Not on file  Occupational History  . Occupation: retired  Tobacco Use  . Smoking status: Former Smoker     Packs/day: 0.75    Years: 60.00    Pack years: 45.00    Types: Cigarettes    Quit date: 11/10/2019    Years since quitting: 1.1  . Smokeless tobacco: Never Used  Vaping Use  . Vaping Use: Former  Substance and Sexual Activity  . Alcohol use: Yes    Alcohol/week: 0.0 standard drinks    Comment: yes on occassion  . Drug use: No  . Sexual activity: Not Currently  Other Topics Concern  . Not on file  Social History Narrative   Brother in law Mr Rexford Maus (one of my patients)   Lives w/ husband       Social Determinants of Health   Financial Resource Strain: Not on file  Food Insecurity: Not on file  Transportation Needs: Not on file  Physical Activity: Not on file  Stress: Not on file  Social Connections: Not on file  Intimate Partner Violence: Not on file    Past Medical History, Surgical history, Social history, and Family history were reviewed and updated as appropriate.   Please see review of systems for further details on the patient's review from today.   Objective:   Physical Exam:  There were no vitals taken for this visit.  Physical Exam Neurological:     Mental Status: She is alert and oriented to person, place, and time.     Cranial Nerves: No dysarthria.  Psychiatric:        Attention and Perception: She is inattentive. She does not perceive auditory hallucinations.        Mood and Affect: Mood is  anxious and depressed.        Speech: Speech normal. Speech is not rapid and pressured or slurred.        Behavior: Behavior is not slowed or aggressive. Behavior is cooperative.        Thought Content: Thought content normal. Thought content is not paranoid or delusional. Thought content does not include homicidal or suicidal ideation. Thought content does not include homicidal or suicidal plan.        Cognition and Memory: Memory is impaired. She exhibits impaired recent memory.     Comments:   Still depressed, anxious and less irritable.  Insight and  judgment are fair Talkative Gets meds mixed up at times but thinks she's better.     Lab Review:     Component Value Date/Time   NA 135 01/02/2020 1602   K 4.5 01/02/2020 1602   CL 98 01/02/2020 1602   CO2 25 01/02/2020 1602   GLUCOSE 96 01/02/2020 1602   GLUCOSE 120 (H) 11/26/2019 0650   BUN 17 01/02/2020 1602   CREATININE 1.08 (H) 01/02/2020 1602   CREATININE 0.77 07/28/2015 1805   CALCIUM 9.9 01/02/2020 1602   PROT 7.9 01/02/2020 1602   ALBUMIN 4.2 01/02/2020 1602   AST 15 01/02/2020 1602   ALT 12 01/02/2020 1602   ALKPHOS 87 01/02/2020 1602   BILITOT 0.2 01/02/2020 1602   GFRNONAA 50 (L) 01/02/2020 1602   GFRAA 58 (L) 01/02/2020 1602       Component Value Date/Time   WBC 8.5 01/02/2020 1602   WBC 12.5 (H) 11/26/2019 0650   RBC 4.03 01/02/2020 1602   RBC 3.79 (L) 11/26/2019 0650   HGB 11.9 01/02/2020 1602   HCT 36.4 01/02/2020 1602   PLT 370 01/02/2020 1602   MCV 90 01/02/2020 1602   MCH 29.5 01/02/2020 1602   MCH 29.6 11/26/2019 0650   MCHC 32.7 01/02/2020 1602   MCHC 32.5 11/26/2019 0650   RDW 14.0 01/02/2020 1602   LYMPHSABS 2.7 01/02/2020 1602   MONOABS 0.4 11/23/2019 1957   EOSABS 0.1 01/02/2020 1602   BASOSABS 0.0 01/02/2020 1602    No results found for: POCLITH, LITHIUM   No results found for: PHENYTOIN, PHENOBARB, VALPROATE, CBMZ   .res Assessment: Plan:    Severe episode of recurrent major depressive disorder, without psychotic features ([place])  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  Bereavement  Delayed sleep phase syndrome  M[place] has some chronic depression and chronic irregular sleep patterns which have been resistant to treatment.  Her chronic depression and anxiety are complicated now by double grief of loss of her sister and now her husband.  She is lonely and mood affected by recent severe mood problems.    Continue lamotrigine increase to 100 mg as recommended and use the small tablets to try  to help mood and make it easier to swallow.  Purpose to help mood.  She felt it helped.  Will leave duloxetine at 30 low dose. No other med changes  Cont counseling for grief, depression, anxiety, and health crises.   She stopped the donepezil bc GI upset.  Consider Exelon patch for cognition.  But defer this because she is not in a condition to start a new medication at this time.  If nausea persists get a second opinion.  Continue ropinirole 2 mg for RLS.  This is better controlled her restless legs.  Disc SE incl hallucinations and dizziness.   This high dosage appears medically necessary.  Call if this fails and will consider doing something with the clonazepam either increasing it or perhaps switching to another benzodiazepine which might have less cognitive problems.  Such as lorazepam Continue clonazepam at LED at 0.25-0.5 mg BID prn anxiety.  Emphasized importance and proper nutrition and sleep and protein.  Doesn't eat much meat bc constipation but will eat fish.  Discussed how protein is necessary for the proper response to depression medicines  FU 3 mos.  Lynder Parents, MD, DFAPA  Please see After Visit Summary for patient specific instructions.  Future Appointments  Date Time Provider [place]  01/17/2021  2:45 PM Laurin Coder, MD [facility] None  01/18/2021  3:00 PM Blanchie Serve, PhD [facility] None  01/20/2021  3:40 PM [name], MD [facility]  02/10/2021  3:00 PM Armbruster, Carlota Raspberry, MD [facility]    No orders of the defined types were placed in this encounter.     -------------------------------

## 2021-01-17 ENCOUNTER — Encounter: Payer: Self-pay | Admitting: Pulmonary Disease

## 2021-01-17 ENCOUNTER — Ambulatory Visit (INDEPENDENT_AMBULATORY_CARE_PROVIDER_SITE_OTHER): Payer: Medicare Other | Admitting: Pulmonary Disease

## 2021-01-17 ENCOUNTER — Other Ambulatory Visit: Payer: Self-pay

## 2021-01-17 VITALS — BP 150/84 | HR 65 | Temp 98.6°F

## 2021-01-17 DIAGNOSIS — R0602 Shortness of breath: Secondary | ICD-10-CM | POA: Diagnosis not present

## 2021-01-17 DIAGNOSIS — J8410 Pulmonary fibrosis, unspecified: Secondary | ICD-10-CM

## 2021-01-17 MED ORDER — GUAIFENESIN-CODEINE 200-10 MG/5ML PO LIQD: 5.0000 mL | Freq: Four times a day (QID) | ORAL | 0 refills | Status: DC | PRN

## 2021-01-17 NOTE — Patient Instructions (Signed)
I will see you back in 3 months  Call with significant concerns  Graded exercises as tolerated  Will call in a prescription for cough medicine for you

## 2021-01-17 NOTE — Progress Notes (Signed)
@Patient  ID: [name], female    DOB: 15-Oct-1945, 75 y.o.   MRN: [id number]  Chief Complaint  Patient presents with  . Follow-up    Sob with exertion.     Referring provider: [name], MD  HPI: 75 year old female, former smoker. PMH significant for postinflammatory pulmonary fibrosis, centrilobular emphysema, chronic respiratory failure with hypoxia, diastolic heart failure, coronary artery disease, hypertension, hyperlipidemia, mild cognitive impairment, history CVA.   Complains of fatigue, tires easily  Previous [facility] pulmonary encounter: 05/05/20 Patient with history of shortness of breath cough. She recollects being told about concern for fibrosis about 4 months ago. She had seen Dr. Melvyn Novas in 2017-documentation did indicate concern for pulmonary fibrosis at the time  Recently quit smoking about a month ago, at the worst was smoking a pack a day  Shortness of breath on exertion Still tired with an exercise limit of couple of blocks at most Used to be able to walk couple of miles  Denies any chest pains or chest discomfort Still does have an occasional cough She does have limitations related to hip and joint pains  Complicated recovery from a vascular procedure  No recurrent skin rash No history of inflammatory bowel process No swallowing difficulty  Last visit was following her PFT. Accompanied by female family member. She experiences shortness of breath walking up 1 flight of stairs or with moderate exertion. She typically recovers with rest. Patient was having billing issues with [employer].  Per [name] patient needs qualifying walk and new order. She is scheduled for HRCT tomorrow to monitor ILD. Denies f/c/s, chest tightness or wheezing  Pulmonary function testing 01/17/2021 FVC 2.91 (95%), FEV1 2.39 (104%), ratio 82, DLCO unc 13.36 (64%) / Normal spirometry without bronchodilator response. Mild diffusion defect. Evidence of air trapping.   No Known  Allergies  Immunization History  Administered Date(s) Administered  . PFIZER(Purple Top)SARS-COV-2 Vaccination 02/20/2020, 03/12/2020  . Pneumococcal Polysaccharide-23 10/24/2018  . Zoster 08/15/2010    Past Medical History:  Diagnosis Date  . AAA (abdominal aortic aneurysm) (HCC)    3.1 cm 07/08/18, 3 year follow-up recommended; possible 3 cm AAA by aortogram 09/13/18  . Anemia    PMH  . Appendicitis with abscess    07/08/18, s/p perc drain; resolved 07/30/18 by CT  . Arthritis   . Bipolar disorder ([place])   . Cerebrovascular disease    intra and extracranial vascular dx per MRI 4/11, neurology rec strict CVRF control  . Colonic inertia   . Constipation    chronic;severe  . Coronary artery disease   . Depression   . Duodenitis   . EKG abnormalities    changes, stress test neg (false EKG changes)  . Gastritis   . GERD (gastroesophageal reflux disease)   . Hypertension   . Hypothyroid 01/16/2014  . Meningioma ([place]) 10/21/2013  . PAD (peripheral artery disease) ([place])   . Psoriasis    sees derm  . Stroke ([place])   . Wears dentures   . Wears glasses     Tobacco History: Social History   Tobacco Use  Smoking Status Former Smoker  . Packs/day: 0.75  . Years: 60.00  . Pack years: 45.00  . Types: Cigarettes  . Quit date: 11/10/2019  . Years since quitting: 1.1  Smokeless Tobacco Never Used   Counseling given: Not Answered   Outpatient Medications Prior to Visit  Medication Sig Dispense Refill  . albuterol (VENTOLIN HFA) 108 (90 Base) MCG/ACT inhaler Inhale 1-2 puffs  into the lungs every 6 (six) hours as needed for wheezing or shortness of breath. 18 g 2  . amLODipine (NORVASC) 2.5 MG tablet Take 1 tablet (2.5 mg total) by mouth daily.    . clonazePAM (KLONOPIN) 0.5 MG tablet Take 1 tablet (0.5 mg total) by mouth 2 (two) times daily as needed for anxiety. 60 tablet 1  . DULoxetine (CYMBALTA) 30 MG capsule Take 1 capsule (30 mg total) by mouth daily. 90 capsule 1  .  famotidine (PEPCID) 20 MG tablet One at bedtime 30 tablet 11  . lamoTRIgine (LAMICTAL) 25 MG tablet Take 2 tablets (50 mg total) by mouth daily. 4 daily. 360 tablet 1  . linaclotide (LINZESS) 145 MCG CAPS capsule Take 1 capsule (145 mcg total) by mouth daily before breakfast. 30 capsule 1  . Melatonin 10 MG TABS Take 20 mg by mouth at bedtime.    . rivaroxaban (XARELTO) 20 MG TABS tablet Take 1 tablet (20 mg total) by mouth daily with supper. 30 tablet 11  . rOPINIRole (REQUIP) 2 MG tablet Take 1 tablet (2 mg total) by mouth every evening. 90 tablet 1  . HYDROcodone-acetaminophen (NORCO) 5-325 MG tablet Take 1 tablet by mouth every 6 (six) hours as needed. (Patient not taking: Reported on 01/03/2021) 12 tablet 0   No facility-administered medications prior to visit.    Review of Systems  Review of Systems  Constitutional: Negative.   Respiratory: Positive for cough.        DOE  Cardiovascular: Negative.     Physical Exam  BP (!) 150/84 (BP Location: Left Arm, Cuff Size: Normal)   Pulse 65   Temp 98.6 F (37 C)   SpO2 98%  Physical Exam Constitutional:      Appearance: Normal appearance.  HENT:     Head: Normocephalic and atraumatic.  Eyes:     Conjunctiva/sclera: Conjunctivae normal.  Cardiovascular:     Rate and Rhythm: Normal rate and regular rhythm.     Heart sounds: No murmur heard. No friction rub.  Pulmonary:     Effort: No respiratory distress.     Breath sounds: Rales present. No wheezing.  Neurological:     Mental Status: She is alert.  Psychiatric:        Mood and Affect: Mood normal.      Lab Results:  CBC    Component Value Date/Time   WBC 8.5 01/02/2020 1602   WBC 12.5 (H) 11/26/2019 0650   RBC 4.03 01/02/2020 1602   RBC 3.79 (L) 11/26/2019 0650   HGB 11.9 01/02/2020 1602   HCT 36.4 01/02/2020 1602   PLT 370 01/02/2020 1602   MCV 90 01/02/2020 1602   MCH 29.5 01/02/2020 1602   MCH 29.6 11/26/2019 0650   MCHC 32.7 01/02/2020 1602   MCHC 32.5  11/26/2019 0650   RDW 14.0 01/02/2020 1602   LYMPHSABS 2.7 01/02/2020 1602   MONOABS 0.4 11/23/2019 1957   EOSABS 0.1 01/02/2020 1602   BASOSABS 0.0 01/02/2020 1602    BMET    Component Value Date/Time   NA 135 01/02/2020 1602   K 4.5 01/02/2020 1602   CL 98 01/02/2020 1602   CO2 25 01/02/2020 1602   GLUCOSE 96 01/02/2020 1602   GLUCOSE 120 (H) 11/26/2019 0650   BUN 17 01/02/2020 1602   CREATININE 1.08 (H) 01/02/2020 1602   CREATININE 0.77 07/28/2015 1805   CALCIUM 9.9 01/02/2020 1602   GFRNONAA 50 (L) 01/02/2020 1602   GFRAA 58 (L) 01/02/2020 1602  BNP    Component Value Date/Time   BNP 330.3 (H) 11/20/2019 0346    ProBNP    Component Value Date/Time   PROBNP 263 03/10/2019 1438    Imaging: No results found.   Assessment & Plan:   Chronic cough  Obstructive lung disease/emphysema  Postinflammatory pulmonary fibrosis  Lung nodule  Plan: Continue bronchodilators  Encourage graded exercises as tolerated   Encouraged to call with any significant concerns  Follow-up in 3 months   Hedwig Mcfall Clearence Ped, MD 01/17/2021

## 2021-01-18 ENCOUNTER — Ambulatory Visit (INDEPENDENT_AMBULATORY_CARE_PROVIDER_SITE_OTHER): Payer: Medicare Other | Admitting: Psychiatry

## 2021-01-18 DIAGNOSIS — F331 Major depressive disorder, recurrent, moderate: Secondary | ICD-10-CM

## 2021-01-18 DIAGNOSIS — F17219 Nicotine dependence, cigarettes, with unspecified nicotine-induced disorders: Secondary | ICD-10-CM

## 2021-01-18 DIAGNOSIS — G4721 Circadian rhythm sleep disorder, delayed sleep phase type: Secondary | ICD-10-CM | POA: Diagnosis not present

## 2021-01-18 DIAGNOSIS — G3184 Mild cognitive impairment, so stated: Secondary | ICD-10-CM | POA: Diagnosis not present

## 2021-01-18 DIAGNOSIS — J84112 Idiopathic pulmonary fibrosis: Secondary | ICD-10-CM | POA: Diagnosis not present

## 2021-01-18 DIAGNOSIS — Z8619 Personal history of other infectious and parasitic diseases: Secondary | ICD-10-CM | POA: Diagnosis not present

## 2021-01-18 DIAGNOSIS — I739 Peripheral vascular disease, unspecified: Secondary | ICD-10-CM | POA: Diagnosis not present

## 2021-01-18 DIAGNOSIS — Z634 Disappearance and death of family member: Secondary | ICD-10-CM

## 2021-01-18 DIAGNOSIS — Z8673 Personal history of transient ischemic attack (TIA), and cerebral infarction without residual deficits: Secondary | ICD-10-CM

## 2021-01-18 DIAGNOSIS — F411 Generalized anxiety disorder: Secondary | ICD-10-CM | POA: Diagnosis not present

## 2021-01-18 NOTE — Progress Notes (Signed)
Psychotherapy Progress Note [facility] Luan Moore, PhD LP  Patient ID: [name]     MRN: [id number] Therapy format: Individual psychotherapy Date: 01/18/2021      Start: 3:28p     Stop: 4:18p     Time Spent: 50 min Location: Telehealth visit -- I connected with this patient by an approved telecommunication method (video), with her informed consent, and verifying identity and patient privacy.  I was located at my office and patient at her home.  As needed, we discussed the limitations, risks, and security and privacy concerns associated with telehealth service, including the availability and conditions which currently govern in-person appointments and the possibility that 3rd-party payment may not be fully guaranteed and she may be responsible for charges.  After she indicated understanding, we proceeded with the session.  Also discussed treatment planning, as needed, including ongoing verbal agreement with the plan, the opportunity to ask and answer all questions, her demonstrated understanding of instructions, and her readiness to call the office should symptoms worsen or she feels she is in a crisis state and needs more immediate and tangible assistance.   Session narrative (presenting needs, interim history, self-report of stressors and symptoms, applications of prior therapy, status changes, and interventions made in session) Substantial technical difficulties at the outset.  Eventually clear enough to get video and audio same time.  Notes she is worried about outliving her money, especially now while the market has been coursing down.  Nearly paid off the house, then borrowed against it more to pay for repairs, has a home equity payment.  Discussed parameters and recommended get a meeting with personal banker or Garment/textile technologist.  Estimates she's going through about $4500/mo. and has under $100,000 left in her portfolio, never can limit to $2K/mo taken out, after SS  income.  Estimates $450K home value, maybe more realistically $425K gross.  Agreed options, ultimately, are take on a renter, sell and downsize, investigate a senior living community, or try to consolidate living with family who are willing and compatible (very unlikely).    Meanwhile, finds herself fatiguing more.  Did try the experiment to get a bit of oxygen when she feels off, which worked.  Has a nebulizer now, though she forgets to use it.  Encouraged to more regularly tend her breathing for benefit of all things.  Therapeutic modalities: Cognitive Behavioral Therapy, Solution-Oriented/Positive Psychology, and Ego-Supportive  Mental Status/Observations:  Appearance:   Casual     Behavior:  Appropriate  Motor:  Normal  Speech/Language:   Clear and Coherent  Affect:  Appropriate  Mood:  depressed  Thought process:  tangential  Thought content:    WNL and some suspicious ideas  Sensory/Perceptual disturbances:    WNL now, notes some derealization and/or quasihalllucinatory moments  Orientation:  Fully oriented  Attention:  Good    Concentration:  Fair  Memory:  WNL  Insight:    Fair  Judgment:   Fair  Impulse Control:  Good   Risk Assessment: Danger to Self: No Self-injurious Behavior: No Danger to Others: No Physical Aggression / Violence: No Duty to Warn: No Access to Firearms a concern: No  Assessment of progress:  stabilized  Diagnosis:   ICD-10-CM   1. Major depressive disorder, recurrent episode, moderate (HCC)  F33.1     2. 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  G31.84     3. 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 Generalized  anxiety disorder  F41.1     5. History of CVA (cerebrovascular accident)  Z86.73     6. Bereavement  Z63.4     7. History of sepsis (recent, in long-term recovery)  Z86.19     8. Nicotine dependence, cigarettes, with unspecified nicotine-induced disorders  F17.219     9. PVD (peripheral  vascular disease) (HCC)  I73.9     10. Idiopathic pulmonary fibrosis ([place]) (per chart; pt self-describes COPD)  J84.112      Plan:  Get in touch with personal banker, either at [facility] or [place], to get help fathoming her finances Consider looking into assisted or supported living facilities Tend breathing more actively, using nebulizer as recommended and adding oxygen as needed Other recommendations/advice as may be noted above Continue to utilize previously learned skills ad lib Maintain medication as prescribed and work faithfully with relevant prescriber(s) if any changes are desired or seem indicated Call the clinic on-call service, present to ER, or call 911 if any life-threatening psychiatric crisis Return for time as available, needs reschedule with prescriber. Already scheduled visit in this office Visit date not found.  Blanchie Serve, PhD Luan Moore, PhD LP Clinical Psychologist, [facility] [address] [place], [place] [address] [phone]

## 2021-01-20 ENCOUNTER — Ambulatory Visit: Payer: Medicare Other | Admitting: Family Medicine

## 2021-01-26 ENCOUNTER — Other Ambulatory Visit: Payer: Self-pay | Admitting: *Deleted

## 2021-01-26 ENCOUNTER — Encounter: Payer: Self-pay | Admitting: *Deleted

## 2021-01-26 NOTE — Patient Outreach (Signed)
[place] [facility]) Care Management  01/26/2021  AHJA MARTELLO 03-04-1946 [id number]   Telephone Assessment-Unsuccessful Outreach #1  RN attempted outreach however unsuccessful. RN able to leave a HIPAA approved voice message requesting a call back.  Will follow up with another outreach call over the next week.  Raina Mina, RN Care Management Coordinator [place] Office [phone]

## 2021-02-01 ENCOUNTER — Other Ambulatory Visit: Payer: Self-pay | Admitting: *Deleted

## 2021-02-01 NOTE — Patient Outreach (Signed)
[facility]) Care Management  02/01/2021  [name] August 12, 1946 [id number]   Telephone Screen-Unsuccessful Outreach #2  Rn attempted outreach call today however unsuccessful. RN able to leave a HIPAA approved voice message requesting a call back.  Will follow up over the next week for pending [facility] services.   Raina Mina, RN Care Management Coordinator [place] Office [phone]

## 2021-02-02 ENCOUNTER — Telehealth: Payer: Self-pay

## 2021-02-02 ENCOUNTER — Other Ambulatory Visit: Payer: Self-pay | Admitting: Psychiatry

## 2021-02-02 DIAGNOSIS — G2581 Restless legs syndrome: Secondary | ICD-10-CM

## 2021-02-02 DIAGNOSIS — F411 Generalized anxiety disorder: Secondary | ICD-10-CM

## 2021-02-02 DIAGNOSIS — G4721 Circadian rhythm sleep disorder, delayed sleep phase type: Secondary | ICD-10-CM

## 2021-02-02 NOTE — Telephone Encounter (Signed)
PRIOR AUTHORIZATION  PA initiation date: 02/02/21  Medication: Linzess 172mcg Insurance Company: Systems developer Submission completed electronically through Cover My Meds: Yes  Will await insurance response re: approval/denial.  APPROVAL  Medication: Linzess 135mcg Insurance Company: Medicare PA response: Drug covered by benefit plan Misc. Notes: Avin Horgan Key: [id number] help? Call us at [phone] Outcome Additional Information Required Drug is covered by current benefit plan. No further PA activity needed Drug Linzess 145MCG capsules Form [employer] Medicare Electronic PA Form [phone] NCPDP)

## 2021-02-03 ENCOUNTER — Other Ambulatory Visit: Payer: Self-pay

## 2021-02-03 ENCOUNTER — Telehealth: Payer: Self-pay | Admitting: Gastroenterology

## 2021-02-03 MED ORDER — LINACLOTIDE 145 MCG PO CAPS: 145.0000 ug | ORAL_CAPSULE | Freq: Every day | ORAL | 1 refills | Status: DC

## 2021-02-03 NOTE — Progress Notes (Signed)
Refill sent.  Patient needs an office visit in July

## 2021-02-09 ENCOUNTER — Other Ambulatory Visit: Payer: Self-pay | Admitting: *Deleted

## 2021-02-09 NOTE — Patient Outreach (Addendum)
[name] Care Management  02/09/2021  [name] 1945-11-30 [id number]   Telephone Assessment-Unsuccessful/Case closed. Outreach #3  RN attempted outreach call however remains unsuccessful. RN was able to leave a HIPAA approved voice message requesting a call back.  Will further engage at that time for pending services. Will reach out once again within a few weeks.  Addendum: Pt with an embedded practice and report has been given to Peter Garter, Therapist, sports.  Raina Mina, RN Care Management Coordinator [place] Office [phone]

## 2021-02-10 ENCOUNTER — Encounter: Payer: Self-pay | Admitting: Gastroenterology

## 2021-02-10 ENCOUNTER — Ambulatory Visit (INDEPENDENT_AMBULATORY_CARE_PROVIDER_SITE_OTHER): Payer: Medicare Other | Admitting: Gastroenterology

## 2021-02-10 VITALS — BP 120/70 | HR 69 | Ht 66.0 in | Wt 168.0 lb

## 2021-02-10 DIAGNOSIS — K5909 Other constipation: Secondary | ICD-10-CM

## 2021-02-10 DIAGNOSIS — R112 Nausea with vomiting, unspecified: Secondary | ICD-10-CM

## 2021-02-10 DIAGNOSIS — Z1211 Encounter for screening for malignant neoplasm of colon: Secondary | ICD-10-CM | POA: Diagnosis not present

## 2021-02-10 MED ORDER — LINACLOTIDE 145 MCG PO CAPS: 145.0000 ug | ORAL_CAPSULE | Freq: Every day | ORAL | 3 refills | Status: DC

## 2021-02-10 NOTE — Progress Notes (Signed)
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  She was last seen here in July of last year.  Please see notes for details of that encounter.  She had been dealing with chronic nausea and occasional vomiting, ongoing for years but had worsened at that time.  She was using Zofran at the time which did not really help much.  Interestingly Pepcid really helps her symptoms but she did not want to take it.  We proceeded with an upper endoscopy at that time, as outlined below.  No concerning findings.  Biopsies negative for H. pylori.  We discussed using Pepcid routinely if that prevented her symptoms, if things persisted consider empiric Reglan or formal gastric emptying study.  She states Pepcid has resolved the symptoms and it works really well for her.  She has not really had any nausea or vomiting since I have last seen her.  No abdominal pains postprandially and tolerating food well.  Otherwise she has had longstanding constipation and has failed multiple laxatives in the past.  She has been on Linzess 145 mcg once daily since I have seen her and she states that has worked Recruitment consultant for her and she is "very happy" with it.  If she does not take the Linzess she will have problems with constipation but if she uses it every day she seldomly ever has to use another laxative to help her out.  Recall that she was admitted in March last year for septic shock due to pyelonephritis and E. coli bacteremia and developed multisystem organ failure.  She has been left with hypoxic respiratory failure and uses oxygen at night.  Also recall that she had a failed attempt at colonoscopy in May 2009. A barium enema at that time showed that barium could not be refluxed beyond the sigmoid colon. A virtual colonoscopy in February 2011 showed a severely tortuous  colon.She had a surgical consultation by Dr. Zella Richer in 2011 for consideration of partial colectomy,as well as by a surgeon in [place] at that time,but she did not have this done.  She has not had colon cancer screening since that time.  She has had a CT scan in March 2021 which showed no problems with her colon.  Echo 11/18/2019 - 70-75%   CT scan 11/11/2019 - IMPRESSION: 1. No acute findings are noted in the abdomen or pelvis. Specifically, no unexpected perinephric fluid collections or other signs of complicated pyelonephritis. 2. Pulmonary fibrosis in the visualize lung bases, similar to prior studies. 3. Aortic atherosclerosis, as well as multi-vessel coronary artery disease and ectasia of the infrarenal abdominal aorta (mean diameter of 2.8 cm). 4. Additional incidental findings, as above.   EGD 04/07/20 -  - A 2 cm hiatal hernia was present. - The exam of the esophagus was otherwise normal. - Retained fluid was found in the gastric body and in the gastric antrum. This was suctioned / lavaged and was cleared. - The exam of the stomach was otherwise normal. Pylorus was patent. - Biopsies were taken with a cold forceps in the gastric body and in the gastric antrum for Helicobacter pylori testing. - The duodenal bulb and second portion of the duodenum were normal.   Past Medical History:  Diagnosis Date  . AAA (abdominal aortic aneurysm) (HCC)    3.1 cm 07/08/18, 3 year follow-up recommended; possible 3 cm  AAA by aortogram 09/13/18  . Anemia    PMH  . Appendicitis with abscess    07/08/18, s/p perc drain; resolved 07/30/18 by CT  . Arthritis   . Bipolar disorder ([place])   . Cerebrovascular disease    intra and extracranial vascular dx per MRI 4/11, neurology rec strict CVRF control  . Colonic inertia   . Constipation    chronic;severe  . Coronary artery disease   . Depression   . Duodenitis   . EKG abnormalities    changes, stress test neg (false EKG changes)   . Gastritis   . GERD (gastroesophageal reflux disease)   . Hypertension   . Hypothyroid 01/16/2014  . Meningioma ([place]) 10/21/2013  . PAD (peripheral artery disease) ([place])   . Psoriasis    sees derm  . Stroke ([place])   . Wears dentures   . Wears glasses      Past Surgical History:  Procedure Laterality Date  . ABDOMINAL AORTOGRAM W/LOWER EXTREMITY N/A 09/13/2018   Procedure: ABDOMINAL AORTOGRAM W/LOWER EXTREMITY;  Surgeon: Elam Dutch, MD;  Location: [facility];  Service: Cardiovascular;  Laterality: N/A;  . arthroscopy  04/2010   Right knee  . CATARACT EXTRACTION W/ INTRAOCULAR LENS  IMPLANT, BILATERAL    . COLONOSCOPY    . FEMORAL-POPLITEAL BYPASS GRAFT  10/22/2018  . FEMORAL-POPLITEAL BYPASS GRAFT Left 10/22/2018   Procedure: LEFT FEMORAL TO BELOW THE KNEE POPLITEAL ARTERY BYPASS GRAFT;  Surgeon: Elam Dutch, MD;  Location: [facility];  Service: Vascular;  Laterality: Left;  . I & D EXTREMITY Left 11/08/2018   Procedure: IRRIGATION AND DEBRIDEMENT EXTREMITY Left Leg;  Surgeon: Elam Dutch, MD;  Location: [facility];  Service: Vascular;  Laterality: Left;  . IR RADIOLOGIST EVAL & MGMT  07/30/2018  . MULTIPLE TOOTH EXTRACTIONS    . TUBAL LIGATION     Family History  Problem Relation Age of Onset  . Breast cancer Mother        metastisis to bones  . Diabetes Son   . Heart disease Other        grandfather   . Alcohol abuse Brother   . Heart disease Brother   . Heart disease Maternal Aunt   . Lung cancer Brother        smoked  . Colon cancer Neg Hx   . Esophageal cancer Neg Hx   . Stomach cancer Neg Hx    Social History   Tobacco Use  . Smoking status: Former Smoker    Packs/day: 0.75    Years: 60.00    Pack years: 45.00    Types: Cigarettes    Quit date: 11/10/2019    Years since quitting: 1.2  . Smokeless tobacco: Never Used  Vaping Use  . Vaping Use: Former  Substance Use Topics  . Alcohol use: Yes    Alcohol/week: 0.0 standard drinks    Comment:  yes on occassion  . Drug use: No   Current Outpatient Medications  Medication Sig Dispense Refill  . albuterol (VENTOLIN HFA) 108 (90 Base) MCG/ACT inhaler Inhale 1-2 puffs into the lungs every 6 (six) hours as needed for wheezing or shortness of breath. 18 g 2  . amLODipine (NORVASC) 2.5 MG tablet Take 1 tablet (2.5 mg total) by mouth daily.    . clonazePAM (KLONOPIN) 0.5 MG tablet TAKE 1 TABLET(0.5 MG) BY MOUTH TWICE DAILY AS NEEDED FOR ANXIETY 60 tablet 1  . DULoxetine (CYMBALTA) 30 MG capsule Take 1 capsule (  30 mg total) by mouth daily. 90 capsule 1  . famotidine (PEPCID) 20 MG tablet One at bedtime 30 tablet 11  . guaiFENesin-Codeine 200-10 MG/5ML LIQD Take 5 mLs by mouth 4 (four) times daily as needed. 210 mL 0  . lamoTRIgine (LAMICTAL) 25 MG tablet Take 2 tablets (50 mg total) by mouth daily. 4 daily. 360 tablet 1  . linaclotide (LINZESS) 145 MCG CAPS capsule Take 1 capsule (145 mcg total) by mouth daily before breakfast. Please schedule an office visit for further refills. Thank you 30 capsule 1  . Melatonin 10 MG TABS Take 20 mg by mouth at bedtime.    . rivaroxaban (XARELTO) 20 MG TABS tablet Take 1 tablet (20 mg total) by mouth daily with supper. 30 tablet 11  . rOPINIRole (REQUIP) 2 MG tablet Take 1 tablet (2 mg total) by mouth every evening. 90 tablet 1   No current facility-administered medications for this visit.   No Known Allergies   Review of Systems: All systems reviewed and negative except where noted in HPI.   Lab Results  Component Value Date   WBC 8.5 01/02/2020   HGB 11.9 01/02/2020   HCT 36.4 01/02/2020   MCV 90 01/02/2020   PLT 370 01/02/2020    Lab Results  Component Value Date   CREATININE 1.08 (H) 01/02/2020   BUN 17 01/02/2020   NA 135 01/02/2020   K 4.5 01/02/2020   CL 98 01/02/2020   CO2 25 01/02/2020    Lab Results  Component Value Date   ALT 12 01/02/2020   AST 15 01/02/2020   ALKPHOS 87 01/02/2020   BILITOT 0.2 01/02/2020      Physical Exam: BP 120/70   Pulse 69   Ht 5\' 6"  (1.676 m)   Wt 168 lb (76.2 kg)   BMI 27.12 kg/m  Constitutional: Pleasant,well-developed, female in no acute distress. Neurological: Alert and oriented to person place and time. Psychiatric: Normal mood and affect. Behavior is normal.   ASSESSMENT AND PLAN: 75 year old female here for reassessment of the following:  Chronic constipation Nausea and vomiting Colon cancer screening  As above chronic colonic inertia with chronic constipation.  Extremely tortuous colon that failed optical colonoscopy, she has not had colon cancer screening for more than 10 years.  She is quite happy with Linzess and this works quite well for her, we will plan on refilling that and continuing it for now.  This is safe for her to take.  We discussed if she wanted to have any further colon cancer screening given her age and comorbidities.  If she were to pursue any colon cancer screening I would recommend a virtual colonoscopy, however if she had a polyp in her colon, especially in the right colon, optical colonoscopy would be extremely challenging.  Given her labs look good, she has no alarm symptoms, and her age and comorbidities, she wishes to forego further colon cancer screening which I think is quite reasonable.  Otherwise, she had an uneventful EGD last year for her upper tract symptoms.  I had recommended using Pepcid routinely if that reduce her symptoms and now on that regimen she has no nausea vomiting or upper tract symptoms and doing quite well.  She should continue Pepcid as needed and can follow-up as needed for these issues.  All questions answered  Shannondale Cellar, MD [facility]

## 2021-02-10 NOTE — Patient Instructions (Addendum)
If you are age 77 or older, your body mass index should be between 23-30. Your Body mass index is 27.12 kg/m. If this is out of the aforementioned range listed, please consider follow up with your Primary Care Provider.  If you are age 61 or younger, your body mass index should be between 19-25. Your Body mass index is 27.12 kg/m. If this is out of the aformentioned range listed, please consider follow up with your Primary Care Provider.   __________________________________________________________  The [facility] providers would like to encourage you to use [facility] to communicate with providers for non-urgent requests or questions.  Due to long hold times on the telephone, sending your provider a message by [facility] may be a faster and more efficient way to get a response.  Please allow 48 business hours for a response.  Please remember that this is for non-urgent requests.  __________________________________________________________  Continue Pepcid as needed.  We have sent the following medications to your pharmacy for you to pick up at your convenience: Linzess 145 mcg : Once daily in the morning   Thank you for entrusting me with your care and for choosing [facility], Dr.  Cellar

## 2021-02-17 ENCOUNTER — Telehealth (INDEPENDENT_AMBULATORY_CARE_PROVIDER_SITE_OTHER): Payer: Medicare Other | Admitting: Family Medicine

## 2021-02-17 DIAGNOSIS — R030 Elevated blood-pressure reading, without diagnosis of hypertension: Secondary | ICD-10-CM | POA: Diagnosis not present

## 2021-02-17 DIAGNOSIS — I10 Essential (primary) hypertension: Secondary | ICD-10-CM | POA: Diagnosis not present

## 2021-02-17 DIAGNOSIS — J84112 Idiopathic pulmonary fibrosis: Secondary | ICD-10-CM

## 2021-02-17 DIAGNOSIS — R059 Cough, unspecified: Secondary | ICD-10-CM | POA: Diagnosis not present

## 2021-02-17 DIAGNOSIS — I5033 Acute on chronic diastolic (congestive) heart failure: Secondary | ICD-10-CM

## 2021-02-17 DIAGNOSIS — R5383 Other fatigue: Secondary | ICD-10-CM | POA: Diagnosis not present

## 2021-02-17 DIAGNOSIS — R29898 Other symptoms and signs involving the musculoskeletal system: Secondary | ICD-10-CM | POA: Diagnosis not present

## 2021-02-17 NOTE — Progress Notes (Signed)
Virtual Visit via Video Note  I connected with CAREEN MAUCH on 02/17/21 at 4:20 PM by a video enabled telemedicine application and verified that I am speaking with the correct person using two identifiers.  Patient location:home, with brother in law - Nino. Consent obtained to discuss health concerns.   My location: [place] office.    I discussed the limitations, risks, security and privacy concerns of performing an evaluation and management service by telephone and the availability of in person appointments. I also discussed with the patient that there may be a patient responsible charge related to this service. The patient expressed understanding and agreed to proceed, consent obtained  Chief complaint:  Chief Complaint  Patient presents with   Fatigue    Pt reports fatigue, chronic dizziness, slight headache, reports BP 195/106 this morning but unsure cuff is accurate. Pt notes this started about 5 days ago, general weakness.     History of Present Illness: [name] is a 75 y.o. female  Multiple symptoms above.  Unfortunately has had a complicated past medical history including emphysema with chronic respiratory failure, hypoxia, peripheral vascular disease, chronic anxiety, depression, CHF,  last visit approximately 1 year ago.  Had previously been hospitalized after septic shock, acute kidney injury, acute hypoxic respiratory failure, as well as acute on chronic diastolic CHF, idiopathic pulmonary fibrosis.  Current concern of elevated BP 160-199/98 165/98. Additional hx from [facility] - elevated BP past few days. Treated with tylenol. Off amlodipine for a long time - stopped about 6 months ago - self reduced meds. Acute bilateral leg weakness when she went out for a walk last week, difficulty walking. Has had persistent weakness in both legs since that time. Shuffling across to floor to walk - new in past week. Does not feel good, lower energy in past week. Some  headache on occasion. Denies slurred speech, no facial droop or facial weakness. No arm weakness. Chronic back pain - no recent changes. No fever.  Poor sleep habits, eating habits off per [facility] - for some time.  Did have increased fatigue last year with sepsis.  Some chronic shortness of breath, more with movement. No recent changes. Intermittent use of oxygen. Home O2sat ok - low of 92-94, up to 98, but increased cough past week or two.  Started smoking again 6 months.  No dysuria or change in urination.  No CP/palpitations.   Patient Active Problem List   Diagnosis Date Noted   Centrilobular emphysema ([place]) 10/19/2020   Chronic respiratory failure with hypoxia ([place]) 12/26/2019   Shortness of breath 12/26/2019   Bilateral pulmonary infiltrates on CXR    Complicated UTI (urinary tract infection) 11/11/2019   AKI (acute kidney injury) ([place]) 11/11/2019   Hyponatremia 11/11/2019   Elevated LFTs 11/11/2019   Sepsis ([place]) 11/11/2019   Wound infection 11/07/2018   Postoperative pain    Sleep disturbance    Tobacco abuse    Acute blood loss anemia    Hypoalbuminemia due to protein-calorie malnutrition ([place])    Debility 10/24/2018   Pre-operative cardiovascular examination 09/26/2018   HLD (hyperlipidemia) 09/26/2018   GAD (generalized anxiety disorder) 08/07/2018   Major depressive disorder, single episode 08/07/2018   Appendiceal abscess 07/08/2018   PVD (peripheral vascular disease) ([place]) 03/15/2018   Atherosclerosis of native artery of left lower extremity with intermittent claudication ([place]) 03/15/2018   Chronic left-sided low back pain with left-sided sciatica 12/25/2017   Facial droop    History of CVA (cerebrovascular accident)  10/23/2017   Critical lower limb ischemia ([place]) 11/08/2016   Smoker 11/08/2016   Postinflammatory pulmonary fibrosis ([place]) 02/14/2016   Cigarette smoker 12/24/2015   Hypothyroid ? 01/16/2014   Mild cognitive impairment 01/16/2014   Meningioma ([place])  10/21/2013   Chest pain 10/20/2013   Medicare annual wellness visit, subsequent 06/16/2013   Dizziness and giddiness 06/16/2013   RLS (restless legs syndrome) 04/29/2012   Abdominal pain 12/27/2010   DEGENERATIVE JOINT DISEASE 09/23/2010   CAROTID ARTERY DISEASE 08/15/2010   CHEST PAIN 08/15/2010   HEADACHE 12/02/2009   BACK PAIN 11/22/2009   CONSTIPATION, CHRONIC 01/29/2008   NAUSEA 01/28/2008   DEPRESSION 03/08/2007   HTN (hypertension) 03/08/2007   Past Medical History:  Diagnosis Date   AAA (abdominal aortic aneurysm) ([place])    3.1 cm 07/08/18, 3 year follow-up recommended; possible 3 cm AAA by aortogram 09/13/18   Anemia    PMH   Appendicitis with abscess    07/08/18, s/p perc drain; resolved 07/30/18 by CT   Arthritis    Bipolar disorder ([place])    Cerebrovascular disease    intra and extracranial vascular dx per MRI 4/11, neurology rec strict CVRF control   Colonic inertia    Constipation    chronic;severe   Coronary artery disease    Depression    Duodenitis    EKG abnormalities    changes, stress test neg (false EKG changes)   Gastritis    GERD (gastroesophageal reflux disease)    Hypertension    Hypothyroid 01/16/2014   Meningioma ([place]) 10/21/2013   PAD (peripheral artery disease) ([place])    Psoriasis    sees derm   Stroke [facility])    Wears dentures    Wears glasses    Past Surgical History:  Procedure Laterality Date   ABDOMINAL AORTOGRAM W/LOWER EXTREMITY N/A 09/13/2018   Procedure: ABDOMINAL AORTOGRAM W/LOWER EXTREMITY;  Surgeon: Elam Dutch, MD;  Location: [place] [facility];  Service: Cardiovascular;  Laterality: N/A;   arthroscopy  04/2010   Right knee   CATARACT EXTRACTION W/ INTRAOCULAR LENS  IMPLANT, BILATERAL     COLONOSCOPY     FEMORAL-POPLITEAL BYPASS GRAFT  10/22/2018   FEMORAL-POPLITEAL BYPASS GRAFT Left 10/22/2018   Procedure: LEFT FEMORAL TO BELOW THE KNEE POPLITEAL ARTERY BYPASS GRAFT;  Surgeon: Elam Dutch, MD;  Location: [place];   Service: Vascular;  Laterality: Left;   I & D EXTREMITY Left 11/08/2018   Procedure: IRRIGATION AND DEBRIDEMENT EXTREMITY Left Leg;  Surgeon: Elam Dutch, MD;  Location: [place];  Service: Vascular;  Laterality: Left;   IR RADIOLOGIST EVAL & MGMT  07/30/2018   MULTIPLE TOOTH EXTRACTIONS     TUBAL LIGATION     No Known Allergies Prior to Admission medications   Medication Sig Start Date End Date Taking? Authorizing Provider  albuterol (VENTOLIN HFA) 108 (90 Base) MCG/ACT inhaler Inhale 1-2 puffs into the lungs every 6 (six) hours as needed for wheezing or shortness of breath. 10/19/20   Martyn Ehrich, NP  amLODipine (NORVASC) 2.5 MG tablet Take 1 tablet (2.5 mg total) by mouth daily. 11/28/19   Caren Griffins, MD  clonazePAM (KLONOPIN) 0.5 MG tablet TAKE 1 TABLET(0.5 MG) BY MOUTH TWICE DAILY AS NEEDED FOR ANXIETY 02/02/21   Cottle, Billey Co., MD  DULoxetine (CYMBALTA) 30 MG capsule Take 1 capsule (30 mg total) by mouth daily. 01/03/21   Cottle, Billey Co., MD  famotidine (PEPCID) 20 MG tablet One at bedtime 02/14/16  Tanda Rockers, MD  guaiFENesin-Codeine 200-10 MG/5ML LIQD Take 5 mLs by mouth 4 (four) times daily as needed. 01/17/21   Laurin Coder, MD  lamoTRIgine (LAMICTAL) 25 MG tablet Take 2 tablets (50 mg total) by mouth daily. 4 daily. 01/03/21   Cottle, Billey Co., MD  linaclotide Rolan Lipa) 145 MCG CAPS capsule Take 1 capsule (145 mcg total) by mouth daily before breakfast. 02/10/21   Armbruster, Carlota Raspberry, MD  Melatonin 10 MG TABS Take 20 mg by mouth at bedtime.    [provider]  rivaroxaban (XARELTO) 20 MG TABS tablet Take 1 tablet (20 mg total) by mouth daily with supper. 02/23/20   [name], MD  rOPINIRole (REQUIP) 2 MG tablet Take 1 tablet (2 mg total) by mouth every evening. 01/03/21   Cottle, Billey Co., MD   Social History   Socioeconomic History   Marital status: Widowed    Spouse name: Not on file   Number of children: 1   Years of education:  Not on file   Highest education level: Not on file  Occupational History   Occupation: retired  Tobacco Use   Smoking status: Former    Packs/day: 0.75    Years: 60.00    Pack years: 45.00    Types: Cigarettes    Quit date: 11/10/2019    Years since quitting: 1.2   Smokeless tobacco: Never  Vaping Use   Vaping Use: Former  Substance and Sexual Activity   Alcohol use: Yes    Alcohol/week: 0.0 standard drinks    Comment: yes on occassion   Drug use: No   Sexual activity: Not Currently  Other Topics Concern   Not on file  Social History Narrative   Brother in law Mr Rexford Maus (one of my patients)   Lives w/ husband       Social Determinants of Health   Financial Resource Strain: Not on file  Food Insecurity: Not on file  Transportation Needs: Not on file  Physical Activity: Not on file  Stress: Not on file  Social Connections: Not on file  Intimate Partner Violence: Not on file    Observations/Objective: There were no vitals filed for this visit. No apparent distress over video, nontoxic appearance.  Appropriate responses.  No apparent facial droop or dysarthria.  No apparent respiratory distress, appears to be speaking in full sentences, no cough during exam.  All questions were answered with understanding expressed of plan.  Assessment and Plan: Weakness of both lower extremities  Elevated blood pressure reading  Fatigue, unspecified type  Cough  Acute on chronic diastolic heart failure (HCC)  Idiopathic pulmonary fibrosis (HCC)  Essential hypertension  Difficult situation with history of multiple medical problems including acute on chronic CHF, chronic respiratory failure, PVD, hypertension, depression, anxiety as above which has compounded her fatigue in the past.  However acute change in symptoms 1 week ago with bilateral leg weakness, persistent difficulty walking, with shuffled gait.  Denies upper extremity weakness, facial droop, and with bilateral  symptoms less likely CVA.  Question lumbar source but denies new low back pain.  Denies fever but has had increased fatigue, new cough.  With history of sepsis, differential includes infectious causes.  Prior acute kidney injury, no recent renal function or labs.  Metabolic cause also possible, hypertensive urgency versus emergency blood pressure readings with above symptoms.  Given above variables and subacute nature of symptoms, did recommend emergency room evaluation for further testing, and possible imaging.  Understanding expressed as well as with her brother-in-law Eyvonne Mechanic who is also on the call.  Follow Up Instructions: ER evaluation tonight.   I discussed the assessment and treatment plan with the patient. The patient was provided an opportunity to ask questions and all were answered. The patient agreed with the plan and demonstrated an understanding of the instructions.   The patient was advised to call back or seek an in-person evaluation if the symptoms worsen or if the condition fails to improve as anticipated.  I provided 30 minutes of non-face-to-face time during this encounter.   [name], MD

## 2021-02-18 ENCOUNTER — Telehealth: Payer: Self-pay | Admitting: Family Medicine

## 2021-02-18 ENCOUNTER — Telehealth (INDEPENDENT_AMBULATORY_CARE_PROVIDER_SITE_OTHER): Payer: Medicare Other | Admitting: Psychiatry

## 2021-02-18 DIAGNOSIS — J9611 Chronic respiratory failure with hypoxia: Secondary | ICD-10-CM | POA: Diagnosis not present

## 2021-02-18 DIAGNOSIS — G4721 Circadian rhythm sleep disorder, delayed sleep phase type: Secondary | ICD-10-CM | POA: Diagnosis not present

## 2021-02-18 DIAGNOSIS — G3184 Mild cognitive impairment, so stated: Secondary | ICD-10-CM | POA: Diagnosis not present

## 2021-02-18 DIAGNOSIS — F411 Generalized anxiety disorder: Secondary | ICD-10-CM | POA: Diagnosis not present

## 2021-02-18 DIAGNOSIS — F331 Major depressive disorder, recurrent, moderate: Secondary | ICD-10-CM | POA: Diagnosis not present

## 2021-02-18 DIAGNOSIS — Z8619 Personal history of other infectious and parasitic diseases: Secondary | ICD-10-CM

## 2021-02-18 DIAGNOSIS — Z8673 Personal history of transient ischemic attack (TIA), and cerebral infarction without residual deficits: Secondary | ICD-10-CM | POA: Diagnosis not present

## 2021-02-18 DIAGNOSIS — I739 Peripheral vascular disease, unspecified: Secondary | ICD-10-CM
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
## 2021-03-03 ENCOUNTER — Telehealth: Payer: Self-pay | Admitting: *Deleted

## 2021-03-03 NOTE — Chronic Care Management (AMB) (Signed)
  Chronic Care Management   Note  03/03/2021 Name: [name] MRN: [id number] DOB: 1945-12-08  [name] is a 75 y.o. year old female who is a primary care patient of Carlota Raspberry Ranell Patrick, MD. I reached out to [name] by phone today in response to a referral sent by Ms. Danella Sensing PCP, Dr. Carlota Raspberry      Ms. Lurie was given information about Chronic Care Management services today including:  CCM service includes personalized support from designated clinical staff supervised by her physician, including individualized plan of care and coordination with other care providers 24/7 contact phone numbers for assistance for urgent and routine care needs. Service will only be billed when office clinical staff spend 20 minutes or more in a month to coordinate care. Only one practitioner may furnish and bill the service in a calendar month. The patient may stop CCM services at any time (effective at the end of the month) by phone call to the office staff. The patient will be responsible for cost sharing (co-pay) of up to 20% of the service fee (after annual deductible is met).  Patient agreed to services and verbal consent obtained.   Follow up plan: Telephone appointment with care management team member scheduled for:03/23/2021  [place] [facility]

## 2021-03-09 NOTE — Telephone Encounter (Signed)
Encounter started in error.

## 2021-03-14 DIAGNOSIS — Z20822 Contact with and (suspected) exposure to covid-19: Secondary | ICD-10-CM | POA: Diagnosis not present

## 2021-03-16 ENCOUNTER — Encounter: Payer: Medicare Other | Admitting: Psychiatry

## 2021-03-16 NOTE — Progress Notes (Signed)
Could not reach pt This encounter was created in error - please disregard.

## 2021-03-23 ENCOUNTER — Ambulatory Visit (INDEPENDENT_AMBULATORY_CARE_PROVIDER_SITE_OTHER): Payer: Medicare Other

## 2021-03-23 DIAGNOSIS — I5033 Acute on chronic diastolic (congestive) heart failure: Secondary | ICD-10-CM

## 2021-03-23 DIAGNOSIS — F329 Major depressive disorder, single episode, unspecified: Secondary | ICD-10-CM

## 2021-03-23 DIAGNOSIS — J84112 Idiopathic pulmonary fibrosis: Secondary | ICD-10-CM

## 2021-03-23 DIAGNOSIS — I10 Essential (primary) hypertension: Secondary | ICD-10-CM

## 2021-03-23 NOTE — Patient Instructions (Addendum)
Visit Information   PATIENT GOALS:   Goals Addressed             This Visit's Progress    RNCM:Manage Fatigue (Tiredness-COPD)       Timeframe:  Long-Range Goal Priority:  Medium Start Date:      03/23/21                       Expected End Date:     08/11/21                  Follow Up Date 04/20/21    - develop a new routine to improve sleep - eat healthy - get at least 7 to 8 hours of sleep at night - keep room cool and dark - use a fan or white noise in bedroom -use oxygen as needed    Why is this important?   Feeling tired or worn out is a common symptom of COPD (chronic obstructive pulmonary disease).  Learning when you feel your best and when you need rest is important.  Managing the tiredness (fatigue) will help you be active and enjoy life.     Notes:       RNCM:Track and Manage My Blood Pressure-Hypertension       Timeframe:  Long-Range Goal Priority:  High Start Date:       03/23/21                      Expected End Date:   08/11/21                    Follow Up Date 04/20/21    - check blood pressure daily - choose a place to take my blood pressure (home, clinic or office, retail store) - write blood pressure results in a log or diary   -call to schedule Primary care provider visit  -take B/P log and B/P monitor to MD appt  Why is this important?   You won't feel high blood pressure, but it can still hurt your blood vessels.  High blood pressure can cause heart or kidney problems. It can also cause a stroke.  Making lifestyle changes like losing a little weight or eating less salt will help.  Checking your blood pressure at home and at different times of the day can help to control blood pressure.  If the doctor prescribes medicine remember to take it the way the doctor ordered.  Call the office if you cannot afford the medicine or if there are questions about it.     Notes:        COPD Action Plan A COPD action plan is a description of what to do when  you have a flare (exacerbation) of chronic obstructive pulmonary disease (COPD). Your action plan is a color-coded plan that lists the symptoms that indicate whether your condition is under control and what actions to take. If you have symptoms in the green zone, it means you are doing well that day. If you have symptoms in the yellow zone, it means you are having a bad day or an exacerbation. If you have symptoms in the red zone, you need urgent medical care. Follow the plan that you and your health care provider developed. Review yourplan with your health care provider at each visit. Red zone Symptoms in this zone mean that you should get medical help right away. They include: Feeling very short of breath, even when  you are resting. Not being able to do any activities because of poor breathing. Not being able to sleep because of poor breathing. Fever or shaking chills. Feeling confused or very sleepy. Chest pain. Coughing up blood. If you have any of these symptoms, call emergency services (911 in the [place] or go to the nearest emergency room. Yellow zone Symptoms in this zone mean that your condition may be getting worse. They include: Feeling more short of breath than usual. Having less energy for daily activities than usual. Phlegm or mucus that is thicker than usual. Needing to use your rescue inhaler or nebulizer more often than usual. More ankle swelling than usual. Coughing more than usual. Feeling like you have a chest cold. Trouble sleeping due to COPD symptoms. Decreased appetite. COPD medicines not helping as much as usual. If you experience any "yellow" symptoms: Keep taking your daily medicines as directed. Use your quick-relief inhaler as told by your health care provider. If you were prescribed steroid medicine to take by mouth (oral medicine), start taking it as told by your health care provider. If you were prescribed an antibiotic medicine, start taking it as told  by your health care provider. Do not stop taking the antibiotic even if you start to feel better. Use oxygen as told by your health care provider. Get more rest. Do your pursed-lip breathing exercises. Do not smoke. Avoid any irritants in the air. If your signs and symptoms do not improve after taking these steps, call yourhealth care provider right away. Green zone Symptoms in this zone mean that you are doing well. They include: Being able to do your usual activities and exercise. Having the usual amount of coughing, including the same amount of phlegm or mucus. Being able to sleep well. Having a good appetite. Where to find more information: You can find more information about COPD from: American Lung Association, My COPD Action Plan: www.lung.org COPD Foundation: www.copdfoundation.[place]: https://wilson-eaton.com/ Follow these instructions at home: Continue taking your daily medicines as told by your health care provider. Make sure you receive all the immunizations that your health care provider recommends, especially the pneumococcal and influenza vaccines. Wash your hands often with soap and water. Have family members wash their hands too. Regular hand washing can help prevent infections. Follow your usual exercise and diet plan. Avoid irritants in the air, such as smoke. Do not use any products that contain nicotine or tobacco. These products include cigarettes, chewing tobacco, and vaping devices, such as e-cigarettes. If you need help quitting, ask your health care provider. Summary A COPD action plan tells you what to do when you have a flare (exacerbation) of chronic obstructive pulmonary disease (COPD). Follow each action plan for your symptoms. If you have any symptoms in the red zone, call emergency services (911 in the [place] or go to the nearest emergency room. This information is not intended to replace advice given to you by your health care  provider. Make sure you discuss any questions you have with your healthcare provider. Document Revised: 07/06/2020 Document Reviewed: 07/06/2020 Elsevier Patient Education  2022 [employer].   Consent to CCM Services: [name] was given information about Chronic Care Management services today including:  CCM service includes personalized support from designated clinical staff supervised by her physician, including individualized plan of care and coordination with other care providers 24/7 contact phone numbers for assistance for urgent and routine care needs. Service will only be billed when office  clinical staff spend 20 minutes or more in a month to coordinate care. Only one practitioner may furnish and bill the service in a calendar month. The patient may stop CCM services at any time (effective at the end of the month) by phone call to the office staff. The patient will be responsible for cost sharing (co-pay) of up to 20% of the service fee (after annual deductible is met).  Patient agreed to services and verbal consent obtained.   Patient verbalizes understanding of instructions provided today and agrees to view in [place].   Telephone follow up appointment with care management team member scheduled for:  Peter Garter RN, [facility], CDE Care Management Coordinator [facility] [phone], Mobile [phone]   CLINICAL CARE PLAN: Patient Care Plan: Hypertension (Adult)     Problem Identified: Lack of self management of Hypertension   Priority: High     Long-Range Goal: Effective Self Management of Hypertension   Start Date: 03/23/2021  Expected End Date: 08/11/2021  This Visit's Progress: On track  Priority: High  Note:   Objective:  Last practice recorded BP readings:  BP Readings from Last 3 Encounters:  02/10/21 120/70  01/17/21 (!) 150/84  10/19/20 140/88   Most recent eGFR/CrCl: No results found for: EGFR  No components found for:  CRCL Current Barriers:  Knowledge Deficits related to basic understanding of hypertension pathophysiology and self care management Knowledge Deficits related to understanding of medications prescribed for management of hypertension Non-adherence to prescribed medication regimen Non-adherence to scheduled provider appointments Unable to independently self manage Hypertension Does not adhere to provider recommendations re: medication Does not adhere to prescribed medication regimen Does not maintain contact with provider office Pt states that she did not go to the ED after her video visit with Dr. Carlota Raspberry on 02/17/21.  States she is still feeling more fatigue and weakness at times.  States when she does check her B/P at home it can range from 170/ 90 something to 140-150/80-90.  States she has not been taking her amlodipine for some months as she wanted to get off of some of her medications.  States she does not use any salt but she does eat frozen [employer] meals frequently. States she is seeing her therapist and psychiatrist regularly and her mood is unchanged. Case Manager Clinical Goal(s):  patient will verbalize understanding of plan for hypertension management patient will attend all scheduled medical appointments: no primary care provider scheduled at this time patient will demonstrate improved adherence to prescribed treatment plan for hypertension as evidenced by taking all medications as prescribed, monitoring and recording blood pressure as directed, adhering to low sodium/DASH diet patient will demonstrate improved health management independence as evidenced by checking blood pressure as directed and notifying PCP if SBP>160 or DBP > 90, taking all medications as prescribe, and adhering to a low sodium diet as discussed. Interventions:  Collaboration with [name], MD regarding development and update of comprehensive plan of care as evidenced by provider attestation and  co-signature Inter-disciplinary care team collaboration (see longitudinal plan of care) Evaluation of current treatment plan related to hypertension self management and patient's adherence to plan as established by provider. Provided education to patient re: stroke prevention, s/s of heart attack and stroke, DASH diet, complications of uncontrolled blood pressure Reviewed medications with patient and discussed importance of compliance Discussed plans with patient for ongoing care management follow up and provided patient with direct contact information for care management team Advised patient, providing education and  rationale, to monitor blood pressure daily and record, calling PCP for findings outside established parameters.  Reviewed scheduled/upcoming provider appointments including: no primary care provider scheduled Instructed to call and schedule appointment with primary care provider as soon as possible Reviewed importance to taking her B/P medication as ordered  Discussed the amount of sodium in her frozen meals and reviewed reading label to find lower sodium options  Referred to Care guide for resources for meal delivery and transportation needs if family is unable to take pt to appts Pt declined referral to CCM pharmacist for adherence issues Self-Care Activities: - Self administers medications as prescribed Attends all scheduled provider appointments Calls provider office for new concerns, questions, or BP outside discussed parameters Checks BP and records as discussed Follows a low sodium diet/DASH diet Patient Goals: - check blood pressure daily - choose a place to take my blood pressure (home, clinic or office, retail store) - write blood pressure results in a log or diary - ask questions to understand -call to schedule MD appt -take B/P log and B/P monitor to MD appt  Follow Up Plan: Telephone follow up appointment with care management team member scheduled for: 04/20/21 3:30  PM The patient has been provided with contact information for the care management team and has been advised to call with any health related questions or concerns.      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   Priority: Medium     Long-Range Goal: Symptom Exacerbation Prevented or Minimized   Start Date: 03/23/2021  Expected End Date: 08/11/2021  This Visit's Progress: On track  Priority: Medium  Note:   Current Barriers:  Knowledge deficits related to basic understanding of COPD disease process Knowledge deficits related to basic COPD self care/management Knowledge deficit related to basic understanding of how to use inhalers and how inhaled medications work Knowledge deficit related to importance of energy conservation Limited Social Support Unable to independently self manage COPD/emphysema with hx of HTN, PVD, depression, CHF,  Does not adhere to provider recommendations re: medications, oxygen use Does not adhere to prescribed medication regimen Lacks social connections  Pt states that she gets fatigues easily and short of breath with exertion.  States she only uses her O2 if she thinks she needs it but it does make her feel better when she does use it.  States her O2 sat is usually 95-99% when she is siting around.  States she is smoking 1/2 packs of cigarettes a day now.   Case Manager Clinical Goal(s): patient will report using inhalers as prescribed including rinsing mouth after use patient will verbalize understanding of COPD action plan and when to seek appropriate levels of medical care patient will verbalize basic understanding of COPD disease process and self care activities  Interventions:  Collaboration with [name], MD regarding development and update of comprehensive plan of care as evidenced by provider attestation and co-signature Inter-disciplinary care team collaboration (see longitudinal plan of  care) Provided patient with basic written and verbal COPD education on self care/management/and exacerbation prevention  Provided patient with COPD action plan and reinforced importance of daily self assessment Provided instruction about proper use of medications used for management of COPD including inhalers Advised patient to self assesses COPD action plan zone and make appointment with provider if in the yellow zone for 48 hours without improvement. Provided education about and advised patient to utilize infection prevention strategies to reduce  risk of respiratory infection  Self-Care Activities:  Patient verbalizes understanding of plan to self manage COPD/emphysea Self administers medications as prescribed Attends all scheduled provider appointments Calls pharmacy for medication refills Patient Goals:  - develop a new routine to improve sleep - eat healthy - get at least 7 to 8 hours of sleep at night - keep room cool and dark - use a fan or white noise in bedroom - begin a symptom diary - bring symptom diary to all visits - develop a rescue plan - eliminate symptom triggers at home - follow rescue plan if symptoms flare-up - keep follow-up appointments - eliminate smoking in my home - identify and remove indoor air pollutants - limit outdoor activity during cold weather - listen for public air quality announcements every day Follow Up Plan: Telephone follow up appointment with care management team member scheduled for: 04/20/21 at 3:30 PM The patient has been provided with contact information for the care management team and has been advised to call with any health related questions or concerns.

## 2021-03-23 NOTE — Chronic Care Management (AMB) (Signed)
Chronic Care Management   CCM RN Visit Note  03/23/2021 Name: [name] MRN: [id number] DOB: [birth year]  Subjective: [name] is a 75 y.o. year old female who is a primary care patient of Carlota Raspberry Ranell Patrick, MD. The care management team was consulted for assistance with disease management and care coordination needs.    Engaged with patient by telephone for initial visit in response to provider referral for case management and/or care coordination services.   Consent to Services:  The patient was given the following information about Chronic Care Management services today, agreed to services, and gave verbal consent: 1. CCM service includes personalized support from designated clinical staff supervised by the primary care provider, including individualized plan of care and coordination with other care providers 2. 24/7 contact phone numbers for assistance for urgent and routine care needs. 3. Service will only be billed when office clinical staff spend 20 minutes or more in a month to coordinate care. 4. Only one practitioner may furnish and bill the service in a calendar month. 5.The patient may stop CCM services at any time (effective at the end of the month) by phone call to the office staff. 6. The patient will be responsible for cost sharing (co-pay) of up to 20% of the service fee (after annual deductible is met). Patient agreed to services and consent obtained.  Patient agreed to services and verbal consent obtained.   Assessment: Review of patient past medical history, allergies, medications, health status, including review of consultants reports, laboratory and other test data, was performed as part of comprehensive evaluation and provision of chronic care management services.   SDOH (Social Determinants of Health) assessments and interventions performed:  SDOH Interventions    Flowsheet Row Most Recent Value  SDOH Interventions   Food Insecurity Interventions Intervention  Not Indicated  [Care guide for Mobile Meals]  Financial Strain Interventions Intervention Not Indicated  Housing Interventions Intervention Not Indicated  Physical Activity Interventions Other (Comments)  [isses with breathing]  Transportation Interventions Intervention Not Indicated  [referred to care guide for needs if family can not take her]  Depression Interventions/Treatment  Currently on Treatment  [currently followed by behavior health]        CCM Care Plan  No Known Allergies  Outpatient Encounter Medications as of 03/23/2021  Medication Sig Note   albuterol (VENTOLIN HFA) 108 (90 Base) MCG/ACT inhaler Inhale 1-2 puffs into the lungs every 6 (six) hours as needed for wheezing or shortness of breath.    clonazePAM (KLONOPIN) 0.5 MG tablet TAKE 1 TABLET(0.5 MG) BY MOUTH TWICE DAILY AS NEEDED FOR ANXIETY    DULoxetine (CYMBALTA) 30 MG capsule Take 1 capsule (30 mg total) by mouth daily.    famotidine (PEPCID) 20 MG tablet One at bedtime    linaclotide (LINZESS) 145 MCG CAPS capsule Take 1 capsule (145 mcg total) by mouth daily before breakfast.    Melatonin 10 MG TABS Take 20 mg by mouth at bedtime.    rivaroxaban (XARELTO) 20 MG TABS tablet Take 1 tablet (20 mg total) by mouth daily with supper. 04/07/2020: Last dose 04/06/20 Sm   rOPINIRole (REQUIP) 2 MG tablet Take 1 tablet (2 mg total) by mouth every evening.    amLODipine (NORVASC) 2.5 MG tablet Take 1 tablet (2.5 mg total) by mouth daily. (Patient not taking: Reported on 03/23/2021)    guaiFENesin-Codeine 200-10 MG/5ML LIQD Take 5 mLs by mouth 4 (four) times daily as needed. (Patient not taking: Reported on 03/23/2021)  lamoTRIgine (LAMICTAL) 25 MG tablet Take 2 tablets (50 mg total) by mouth daily. 4 daily. (Patient not taking: Reported on 03/23/2021)    No facility-administered encounter medications on file as of 03/23/2021.    Patient Active Problem List   Diagnosis Date Noted   Centrilobular emphysema ([place]) 10/19/2020    Chronic respiratory failure with hypoxia ([place]) 12/26/2019   Shortness of breath 12/26/2019   Bilateral pulmonary infiltrates on CXR    Complicated UTI (urinary tract infection) 11/11/2019   AKI (acute kidney injury) ([place]) 11/11/2019   Hyponatremia 11/11/2019   Elevated LFTs 11/11/2019   Sepsis ([place]) 11/11/2019   Wound infection 11/07/2018   Postoperative pain    Sleep disturbance    Tobacco abuse    Acute blood loss anemia    Hypoalbuminemia due to protein-calorie malnutrition ([place])    Debility 10/24/2018   Pre-operative cardiovascular examination 09/26/2018   HLD (hyperlipidemia) 09/26/2018   GAD (generalized anxiety disorder) 08/07/2018   Major depressive disorder, single episode 08/07/2018   Appendiceal abscess 07/08/2018   PVD (peripheral vascular disease) ([place]) 03/15/2018   Atherosclerosis of native artery of left lower extremity with intermittent claudication ([place]) 03/15/2018   Chronic left-sided low back pain with left-sided sciatica 12/25/2017   Facial droop    History of CVA (cerebrovascular accident) 10/23/2017   Critical lower limb ischemia ([place]) 11/08/2016   Smoker 11/08/2016   Postinflammatory pulmonary fibrosis ([place]) 02/14/2016   Cigarette smoker 12/24/2015   Hypothyroid ? 01/16/2014   Mild cognitive impairment 01/16/2014   Meningioma ([place]) 10/21/2013   Chest pain 10/20/2013   Medicare annual wellness visit, subsequent 06/16/2013   Dizziness and giddiness 06/16/2013   RLS (restless legs syndrome) 04/29/2012   Abdominal pain 12/27/2010   DEGENERATIVE JOINT DISEASE 09/23/2010   CAROTID ARTERY DISEASE 08/15/2010   CHEST PAIN 08/15/2010   HEADACHE 12/02/2009   BACK PAIN 11/22/2009   CONSTIPATION, CHRONIC 01/29/2008   NAUSEA 01/28/2008   DEPRESSION 03/08/2007   HTN (hypertension) 03/08/2007    Conditions to be addressed/monitored:HTN, COPD, Depression, and Pulmonary Disease  Care Plan : Hypertension (Adult)  Updates made by Dimitri Ped, RN since  03/23/2021 12:00 AM     Problem: Lack of self management of Hypertension   Priority: High     Long-Range Goal: Effective Self Management of Hypertension   Start Date: 03/23/2021  Expected End Date: 08/11/2021  This Visit's Progress: On track  Priority: High  Note:   Objective:  Last practice recorded BP readings:  BP Readings from Last 3 Encounters:  02/10/21 120/70  01/17/21 (!) 150/84  10/19/20 140/88   Most recent eGFR/CrCl: No results found for: EGFR  No components found for: CRCL Current Barriers:  Knowledge Deficits related to basic understanding of hypertension pathophysiology and self care management Knowledge Deficits related to understanding of medications prescribed for management of hypertension Non-adherence to prescribed medication regimen Non-adherence to scheduled provider appointments Unable to independently self manage Hypertension Does not adhere to provider recommendations re: medication Does not adhere to prescribed medication regimen Does not maintain contact with provider office Pt states that she did not go to the ED after her video visit with Dr. Carlota Raspberry on 02/17/21.  States she is still feeling more fatigue and weakness at times.  States when she does check her B/P at home it can range from 170/ 90 something to 140-150/80-90.  States she has not been taking her amlodipine for some months as she wanted to get off of some of her medications.  States  she does not use any salt but she does eat frozen [employer] meals frequently. States she is seeing her therapist and psychiatrist regularly and her mood is unchanged. Case Manager Clinical Goal(s):  patient will verbalize understanding of plan for hypertension management patient will attend all scheduled medical appointments: no primary care provider scheduled at this time patient will demonstrate improved adherence to prescribed treatment plan for hypertension as evidenced by taking all medications as prescribed,  monitoring and recording blood pressure as directed, adhering to low sodium/DASH diet patient will demonstrate improved health management independence as evidenced by checking blood pressure as directed and notifying PCP if SBP>160 or DBP > 90, taking all medications as prescribe, and adhering to a low sodium diet as discussed. Interventions:  Collaboration with [name], MD regarding development and update of comprehensive plan of care as evidenced by provider attestation and co-signature Inter-disciplinary care team collaboration (see longitudinal plan of care) Evaluation of current treatment plan related to hypertension self management and patient's adherence to plan as established by provider. Provided education to patient re: stroke prevention, s/s of heart attack and stroke, DASH diet, complications of uncontrolled blood pressure Reviewed medications with patient and discussed importance of compliance Discussed plans with patient for ongoing care management follow up and provided patient with direct contact information for care management team Advised patient, providing education and rationale, to monitor blood pressure daily and record, calling PCP for findings outside established parameters.  Reviewed scheduled/upcoming provider appointments including: no primary care provider scheduled Instructed to call and schedule appointment with primary care provider as soon as possible Reviewed importance to taking her B/P medication as ordered  Discussed the amount of sodium in her frozen meals and reviewed reading label to find lower sodium options  Referred to Care guide for resources for meal delivery and transportation needs if family is unable to take pt to appts Pt declined referral to CCM pharmacist for adherence issues Self-Care Activities: - Self administers medications as prescribed Attends all scheduled provider appointments Calls provider office for new concerns, questions, or  BP outside discussed parameters Checks BP and records as discussed Follows a low sodium diet/DASH diet Patient Goals: - check blood pressure daily - choose a place to take my blood pressure (home, clinic or office, retail store) - write blood pressure results in a log or diary - ask questions to understand -call to schedule MD appt -take B/P log and B/P monitor to MD appt  Follow Up Plan: Telephone follow up appointment with care management team member scheduled for: 04/20/21 3:30 PM The patient has been provided with contact information for the care management team and has been advised to call with any health related questions or concerns.      Care Plan : COPD (Adult)  Updates made by Dimitri Ped, RN since 03/23/2021 12:00 AM     Problem: Lack of self management of COPD/emphysema   Priority: Medium     Long-Range Goal: Symptom Exacerbation Prevented or Minimized   Start Date: 03/23/2021  Expected End Date: 08/11/2021  This Visit's Progress: On track  Priority: Medium  Note:   Current Barriers:  Knowledge deficits related to basic understanding of COPD disease process Knowledge deficits related to basic COPD self care/management Knowledge deficit related to basic understanding of how to use inhalers and how inhaled medications work Knowledge deficit related to importance of energy conservation Limited Social Support Unable to independently self manage COPD/emphysema with hx of HTN, PVD, depression, CHF,  Does  not adhere to provider recommendations re: medications, oxygen use Does not adhere to prescribed medication regimen Lacks social connections  Pt states that she gets fatigues easily and short of breath with exertion.  States she only uses her O2 if she thinks she needs it but it does make her feel better when she does use it.  States her O2 sat is usually 95-99% when she is siting around.  States she is smoking 1/2 packs of cigarettes a day now.   Case Manager Clinical  Goal(s): patient will report using inhalers as prescribed including rinsing mouth after use patient will verbalize understanding of COPD action plan and when to seek appropriate levels of medical care patient will verbalize basic understanding of COPD disease process and self care activities  Interventions:  Collaboration with [name], MD regarding development and update of comprehensive plan of care as evidenced by provider attestation and co-signature Inter-disciplinary care team collaboration (see longitudinal plan of care) Provided patient with basic written and verbal COPD education on self care/management/and exacerbation prevention  Provided patient with COPD action plan and reinforced importance of daily self assessment Provided instruction about proper use of medications used for management of COPD including inhalers Advised patient to self assesses COPD action plan zone and make appointment with provider if in the yellow zone for 48 hours without improvement. Provided education about and advised patient to utilize infection prevention strategies to reduce risk of respiratory infection  Self-Care Activities:  Patient verbalizes understanding of plan to self manage COPD/emphysea Self administers medications as prescribed Attends all scheduled provider appointments Calls pharmacy for medication refills Patient Goals:  - develop a new routine to improve sleep - eat healthy - get at least 7 to 8 hours of sleep at night - keep room cool and dark - use a fan or white noise in bedroom - begin a symptom diary - bring symptom diary to all visits - develop a rescue plan - eliminate symptom triggers at home - follow rescue plan if symptoms flare-up - keep follow-up appointments - eliminate smoking in my home - identify and remove indoor air pollutants - limit outdoor activity during cold weather - listen for public air quality announcements every day Follow Up Plan:  Telephone follow up appointment with care management team member scheduled for: 04/20/21 at 3:30 PM The patient has been provided with contact information for the care management team and has been advised to call with any health related questions or concerns.       Plan:Telephone follow up appointment with care management team member scheduled for:  04/20/21 and The patient has been provided with contact information for the care management team and has been advised to call with any health related questions or concerns.  Peter Garter RN, BSN,CCM, CDE Care Management Coordinator [place] [phone], Mobile [phone]

## 2021-03-29 ENCOUNTER — Telehealth: Payer: Self-pay

## 2021-03-29 NOTE — Telephone Encounter (Signed)
   Telephone encounter was:  Unsuccessful.  03/29/2021 Name: [name] MRN: [id number] DOB: April 10, 1946  Unsuccessful outbound call made today to assist with:  Transportation Needs , Food Insecurity, and moving.   Outreach Attempt:  1st Attempt  A HIPAA compliant voice message was left requesting a return call.  Instructed patient to call back at [phone].  Lyriq Finerty, AAS Paralegal, [facility]  [address], [place] [address] ??[email or web address]  ?? [phone]   [email or web address]

## 2021-04-01 ENCOUNTER — Ambulatory Visit (INDEPENDENT_AMBULATORY_CARE_PROVIDER_SITE_OTHER): Payer: Medicare Other | Admitting: Psychiatry

## 2021-04-01 ENCOUNTER — Telehealth: Payer: Self-pay

## 2021-04-01 DIAGNOSIS — F331 Major depressive disorder, recurrent, moderate: Secondary | ICD-10-CM

## 2021-04-01 DIAGNOSIS — Z9114 Patient's other noncompliance with medication regimen: Secondary | ICD-10-CM

## 2021-04-01 DIAGNOSIS — Z62820 Parent-biological child conflict: Secondary | ICD-10-CM | POA: Diagnosis not present

## 2021-04-01 DIAGNOSIS — Z8619 Personal history of other infectious and parasitic diseases: Secondary | ICD-10-CM | POA: Diagnosis not present

## 2021-04-01 DIAGNOSIS — Z8673 Personal history of transient ischemic attack (TIA), and cerebral infarction without residual deficits: Secondary | ICD-10-CM | POA: Diagnosis not present

## 2021-04-01 DIAGNOSIS — G3184 Mild cognitive impairment, so stated: Secondary | ICD-10-CM

## 2021-04-01 DIAGNOSIS — G4721 Circadian rhythm sleep disorder, delayed sleep phase type: Secondary | ICD-10-CM | POA: Diagnosis not present

## 2021-04-01 NOTE — Progress Notes (Signed)
Psychotherapy Progress Note [facility] Luan Moore, PhD LP  Patient ID: [name]     MRN: [id number] Therapy format: Individual psychotherapy Date: 04/01/2021      Start: 3:29p     Stop: 4:18p     Time Spent: 49 min Location: Telehealth visit -- I connected with this patient by an approved telecommunication method (audio only), with her informed consent, and verifying identity and patient privacy.  I was located at my office and patient at her home.  As needed, we discussed the limitations, risks, and security and privacy concerns associated with telehealth service, including the availability and conditions which currently govern in-person appointments and the possibility that 3rd-party payment may not be fully guaranteed and she may be responsible for charges.  After she indicated understanding, we proceeded with the session.  Also discussed treatment planning, as needed, including ongoing verbal agreement with the plan, the opportunity to ask and answer all questions, her demonstrated understanding of instructions, and her readiness to call the office should symptoms worsen or she feels she is in a crisis state and needs more immediate and tangible assistance.   Session narrative (presenting needs, interim history, self-report of stressors and symptoms, applications of prior therapy, status changes, and interventions made in session) Scheduled off cancellation list.  C/o [name] visiting recently and getting into repeated arguments.  Triggered by political differences, and, at least as far as she can see, him being on a hair trigger, but she was able to successfully get him to back down, drop the subject once political name-calling got started.  Support/empathy provided.   Oxygen levels were going down but came back.  Has PRN oxygen and scheduled inhaler for COPD, seem to be working.  Says she's been losing some of the benefit she got out of hospitalization and rehab, admits  she isn't necessarily using her inhalers fully regularly.  Can't drive any more for dizziness.  Is practicing [place] some, which keeps her more mentally active.  Admits she does kind of want death to come get her, but no, she doesn't want to endure disability, dependency, and despair first.    Will sometimes get to grocery with brother in law, though her legs tend to give out.  Suggested she make sure her inhaler is current and she has oxygen with her if possible.  Does not have a concentrator, and didn't think of it, but skeptical of Medicare covering.  Urged to ask rather than assume, agrees to contact her PCP and/or pulmonologist about getting one.  Meanwhile, should make sure she stays on schedule with inhaler.    Therapeutic modalities: Cognitive Behavioral Therapy, Solution-Oriented/Positive Psychology, and Ego-Supportive  Mental Status/Observations:  Appearance:   Casual     Behavior:  Appropriate  Motor:  limited  Speech/Language:   Clear and Coherent  Affect:  Appropriate  Mood:  dysthymic  Thought process:  normal  Thought content:    Some  suspiciousness  Sensory/Perceptual disturbances:    WNL  Orientation:  grossly intact  Attention:  Good    Concentration:  Fair  Memory:  grossly intact  Insight:    Good  Judgment:   Fair  Impulse Control:  Fair   Risk Assessment: Danger to Self: No Self-injurious Behavior: No Danger to Others: No Physical Aggression / Violence: No Duty to Warn: No Access to Firearms a concern: No  Assessment of progress:  stabilized  Diagnosis:   ICD-10-CM   1. Major depressive disorder, recurrent episode, moderate (  [place])  F33.1     2. 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  G31.84     3. Relationship problem between parent and child  Z62.820     4. Noncompliance with medication regimen  Z91.14     5. History of CVA (cerebrovascular accident)  Z86.73     6. History of sepsis  Z86.19     7. 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      Plan:  Check into availability of oxygen concentrator, otherwise practice verify-don't-assume policy about coverage and similar things Endorse practicing [place] for mental stimulation, and any other brain-challenging items she is interested in Keep up with medical recommendations -- her pulmonary and vascular conditions are both risky and can cause disability and quality of life issues well before death Recommend Senior Resources for help connecting to rides and other services she will need as an isolated elder Other recommendations/advice as may be noted above Continue to utilize previously learned skills ad lib Maintain medication as prescribed and work faithfully with relevant prescriber(s) if any changes are desired or seem indicated Call the clinic on-call service, present to ER, or call 911 if any life-threatening psychiatric crisis Return 2-4 wks. Already scheduled visit in this office 06/16/2021.  Blanchie Serve, PhD Luan Moore, PhD LP Clinical Psychologist, [facility] [address] [place], [place] [address] [phone]

## 2021-04-01 NOTE — Telephone Encounter (Signed)
   Telephone encounter was:  Unsuccessful.  04/01/2021 Name: [name] MRN: [id number] DOB: 11-28-1945  Unsuccessful outbound call made today to assist with:  Food Insecurity  Outreach Attempt:  2nd Attempt  A HIPAA compliant voice message was left requesting a return call.  Instructed patient to call back at [phone].  Aureliano Oshields, AAS Paralegal, [place]  [address], [place] [address] ??[email or web address]  ?? [phone]   [email or web address]

## 2021-04-06 ENCOUNTER — Telehealth: Payer: Self-pay

## 2021-04-06 NOTE — Telephone Encounter (Signed)
   Telephone encounter was:  Unsuccessful.  04/06/2021 Name: SEVEN CHALLA MRN: [id number] DOB: Sep 09, 1946  Unsuccessful outbound call made today to assist with:  food, transportation and moving.   Outreach Attempt:  3rd Attempt.  Referral closed unable to contact patient.  A HIPAA compliant voice message was left requesting a return call.  Instructed patient to call back at [phone].  Amauris Debois, AAS Paralegal, [facility]  [address], [place] [address] ??[email or web address]  ?? [phone]   [email or web address]

## 2021-04-12 ENCOUNTER — Telehealth (INDEPENDENT_AMBULATORY_CARE_PROVIDER_SITE_OTHER): Payer: Medicare Other | Admitting: Psychiatry

## 2021-04-12 ENCOUNTER — Encounter: Payer: Self-pay | Admitting: Psychiatry

## 2021-04-12 ENCOUNTER — Ambulatory Visit: Payer: Medicare Other | Admitting: Psychiatry

## 2021-04-12 ENCOUNTER — Other Ambulatory Visit: Payer: Self-pay

## 2021-04-12 DIAGNOSIS — Z634 Disappearance and death of family member: Secondary | ICD-10-CM

## 2021-04-12 DIAGNOSIS — G4721 Circadian rhythm sleep disorder, delayed sleep phase type: Secondary | ICD-10-CM | POA: Diagnosis not present

## 2021-04-12 DIAGNOSIS — G2581 Restless legs syndrome: Secondary | ICD-10-CM | POA: Diagnosis not present

## 2021-04-12 DIAGNOSIS — Z8673 Personal history of transient ischemic attack (TIA), and cerebral infarction without residual deficits: Secondary | ICD-10-CM

## 2021-04-12 DIAGNOSIS — G3184 Mild cognitive impairment, so stated: Secondary | ICD-10-CM

## 2021-04-12 DIAGNOSIS — F17219 Nicotine dependence, cigarettes, with unspecified nicotine-induced disorders: Secondary | ICD-10-CM

## 2021-04-12 DIAGNOSIS — F411 Generalized anxiety disorder: Secondary | ICD-10-CM | POA: Diagnosis not present

## 2021-04-12 DIAGNOSIS — F331 Major depressive disorder, recurrent, moderate: Secondary | ICD-10-CM | POA: Diagnosis not present

## 2021-04-12 DIAGNOSIS — F332 Major depressive disorder, recurrent severe without psychotic features: Secondary | ICD-10-CM

## 2021-04-12 MED ORDER — DULOXETINE HCL 30 MG PO CPEP
30.0000 mg | ORAL_CAPSULE | Freq: Two times a day (BID) | ORAL | 1 refills | Status: DC
Start: 2021-04-12 — End: 2021-11-03

## 2021-04-12 MED ORDER — CLONAZEPAM 0.5 MG PO TABS: ORAL_TABLET | ORAL | 2 refills | Status: DC

## 2021-04-12 NOTE — Progress Notes (Signed)
[name] [id number] 1945-10-06 75 y.o.   Virtual Visit via WebEx  I connected with pt by audio and visual WebEx app and verified that I am speaking with the correct person using two identifiers.   I discussed the limitations, risks, security and privacy concerns of performing an evaluation and management service by t WebEx and the availability of in person appointments. I also discussed with the patient that there may be a patient responsible charge related to this service. The patient expressed understanding and agreed to proceed.  I discussed the assessment and treatment plan with the patient. The patient was provided an opportunity to ask questions and all were answered. The patient agreed with the plan and demonstrated an understanding of the instructions.   The patient was advised to call back or seek an in-person evaluation if the symptoms worsen or if the condition fails to improve as anticipated.  I provided 30 minutes of non-face-to-face time during this encounter.  The patient was located at home and the provider was located office.  Session from 3:00 until 3:30 PM   Subjective:   Patient ID:  [name] is a 75 y.o. (DOB 21-Aug-1946) female.  Chief Complaint:  Chief Complaint  Patient presents with   Follow-up   Depression   Anxiety    Depression        Associated symptoms include decreased concentration and fatigue.  Associated symptoms include no suicidal ideas.  Past medical history includes anxiety.   Anxiety Symptoms include decreased concentration, dizziness, nausea, nervous/anxious behavior and shortness of breath. Patient reports no confusion, palpitations or suicidal ideas.    [name] presents to the office today for follow-up of depression and anxiety and MCI.    at visit January 09, 2019.  She was having problems with restless legs and ropinirole was increased to 3 mg every afternoon.  There were no other med changes.  visit Feb 05, 2019.  She  was having mixture of depression and anxiety and chronic insomnia with delayed sleep phase.  For the reasons mentioned at the last visit she was started on risperidone 0.25 mg nightly.  She was maintained on duloxetine 90 mg, clonazepam 1 mg nightly, lamotrigine 150 twice daily, and ropinirole 2 mg 1-1/2 tablets nightly for restless legs.  visit September 2020.  She had stopped the risperidone because she only took it a few days.  She felt nausea and spaced out on it.  No other meds were changed.  Last visit December 2020.  The following was noted: Doing very poorly.  Not getting dressed and not out of the house for 5 days.  Hard to walk with pain.  Falling apart overnight.  Feet hurt at night.  Worries over poor circulation to toes.  Placed on blood thinners.  Continued health problems with COPD.  Energy is low and hard time walking.  Arthritis in back.  House on the market and that's stressful.  Sx largely unchanged but "it's circumstances".  Planning to move to [facility] but ambivalent.  Brother in law lives next door and wants her to stay here.  Also stressed over politics and social things.   Had period of crippling nausea and cut all meds back in 1/2.  Thinks it's mental issue.  Nausea is some better with dose reduction but is more depressed.  Plan: Use pill box to improve compliance. DT nausea  split meds but increase duloxetine 30 mg BID and lamotrigine 75 mg BID. She's been on lower  dose DT nausea but is more depressed.  January 02, 2020 appointment, the following is noted: She got septic with a UTI and then was admitted to [facility] for [facility]. Home now.  Getting stronger.  Having to take nausea meds. Also diarrhea.  Would like to be on higher dose of meds but can't tolerate it.  Still on some prednisone.  Pending cardiology with CHF.  Also pulmonology. Sleep is pretty good with meds.   No meds changed.  04/26/20 appt with the following noted:  Missed a lot of med DT nausea.  Back on  duloxetine 60 and lamotrigine 75 mg for a week.  Was more depressed and dizzy off the meds. Nausea comes and goes in waves but could be any time of day but maybe worse in the evening. Eats poorly usually.  If feels bad doesn't want to cook.   Pepcid helps nausea.  Zofran doesn't help. Lately more trouble falling asleep.  Plan: Rec trial mirtazapine 30 mg HS to help with depression and potentially nausea hopefully.  Nausea is interfering with depression treatment.  08/04/20 appt with following noted: Took mirtazapine 30 mg for a week and felt like a zombie with jitteriness outside.  Didn't help nausea much.  Noticing a pattern with nausea related to constipation and bowel problems.  Not ready to take lamotrigine bc it's hard to swallow but felt emotionally better when on it.  Wonders if bipolar bc jumps from topic to topic and talks a lot. Wants to stay on clonazepam to help her sleep.  Aware of memory concerns. Not taking lorazepam but not sure why she isn't.  Thinks it doesn't work as well as clonazepam.  Says she's taking clonazepam at night now. Plan;  Restart lamotrigine at 25 mg and use the small tablets to try to help mood and make it easier to swallow.  Purpose to help mood.  She felt it helped. Will leave duloxetine at 30 low dose. DC mirtazapine DT SE  10/11/2020 appointment with the following noted:  Started and stopped lamotrigine bc not sticking with schedule so just up to lamotrigine 50 mg daily. RLS requires ropinirole.  Sometimes forgets it and has to keep moving.    Periods of being horrible condition with depression but not suicidal.  Hasnt' been that bad lately.  Still grieving [name] and her sister.  Taking a long time.  Watch too much TV.  Sister [name] was her best friend.   Sometimes feels her presence. No energy to move back to [place] but son encourages it.  Procrastinates it bc moving is overwhelming.  Eating OK.  Enjoyed seeing son and gkids.   Nausea better but comes  and goes. Plan: Continue lamotrigine increase to 100 mg as recommended and use the small tablets to try to help mood and make it easier to swallow.  Purpose to help mood.  She felt it helped.  01/03/2021 appointment with following noted: Up some times and down some times.  Manages.  Kind of average. Not as depressed as in the past.  Still getting dizzy easily.  Sleep hours getting worse instead of bettter.  Once of twice weekly stays up all night and then naps.  Doesn't work out well for her body. Still tends to nausea but variable regardless of what she takes.  Most days consistent with lamotrigine.  Easier to swallow the small lamotrigine 25 vs 100 mg tablets. Don't feel the same since the Sepsis. Going to get driver's license renewed tomorrow. RLS  managed with ropinirole.   Sleep pretty good, except irregular hours.Pt reports that mood is Depressed worse at night and describes anxiety as worse and moderate in the same dose for depression but comes and goes.  She wonders if she might be bipolar like her sister.She is also having problems with irritability. Anxiety symptoms include: Excessive Worry, Panic Symptoms,. Pt reports sleep is more regular and consistent with melatonin 10 mg nightly.. Pt reports that appetite is decreased. Pt reports that energy is poor and loss of interest or pleasure in usual activities, poor motivation and withdrawn from usual activities. Concentration is scattered.  Suicidal thoughts:  denied by patient. B In Law next door.   Increased ropinirole helped RLS. Plan: No med changes  04/12/2021 appointment with the following noted: Had to stop driving bc of dizzy.  Eyes flutter.  Balance not always good.  Back pain. Fighting depression.  Fight with [place] over [place] moving to [place] and doesn't want to help. Still in [place].  Was hoping to move back to [place]. Lonely here and would like support of family. Otherwise feels better than last year.  Needs son to help her move there and  doesn't understand why. Not profoundly depressed but is down.   Lately increased clonazepam to 0.5 mg just at night for anxiety and helps her sleep better and have less anxious thoughts.   Not great sleep.  Eats frozen foods. B in law helps her. Wants to increase duloxetine to 60 mg daily but split dose bc of nausea easily.  Her sister had bipolar disorder.  Past Psychiatric Medication Trials: Lithium 300 insomnia, Depakote, olanzapine 2.5,  lamotrigine 300, ropinirole as high as 2 mg 3 times daily for restless legs,  Aricept poorly tolerated,  Abilify side effects,  fluoxetine, sertraline, paroxetine, Lexapro, duloxetine 90,  Mirtazapine 30 zombie Donepezil SE Long psychiatric history.  Sister bipolar. I have never prescribed lorazepam nor Xanax  Review of Systems:  Review of Systems  Constitutional:  Positive for fatigue.  Respiratory:  Positive for shortness of breath.   Cardiovascular:  Negative for palpitations.       Claudication   Gastrointestinal:  Positive for nausea. Negative for anal bleeding.  Musculoskeletal:  Positive for back pain.  Neurological:  Positive for dizziness and weakness. Negative for tremors.  Psychiatric/Behavioral:  Positive for decreased concentration, dysphoric mood and sleep disturbance. Negative for agitation, behavioral problems, confusion, hallucinations, self-injury and suicidal ideas. The patient is nervous/anxious. The patient is not hyperactive.   LEG surgery for claudication Jan 3  Medications: I have reviewed the patient's current medications.  Current Outpatient Medications  Medication Sig Dispense Refill   albuterol (VENTOLIN HFA) 108 (90 Base) MCG/ACT inhaler Inhale 1-2 puffs into the lungs every 6 (six) hours as needed for wheezing or shortness of breath. 18 g 2   amLODipine (NORVASC) 2.5 MG tablet Take 1 tablet (2.5 mg total) by mouth daily.     famotidine (PEPCID) 20 MG tablet One at bedtime 30 tablet 11   lamoTRIgine (LAMICTAL)  25 MG tablet Take 2 tablets (50 mg total) by mouth daily. 4 daily. (Patient taking differently: Take 50 mg by mouth daily.) 360 tablet 1   linaclotide (LINZESS) 145 MCG CAPS capsule Take 1 capsule (145 mcg total) by mouth daily before breakfast. 90 capsule 3   Melatonin 10 MG TABS Take 20 mg by mouth at bedtime.     rivaroxaban (XARELTO) 20 MG TABS tablet Take 1 tablet (20 mg total) by mouth daily with  supper. 30 tablet 11   rOPINIRole (REQUIP) 2 MG tablet Take 1 tablet (2 mg total) by mouth every evening. 90 tablet 1   clonazePAM (KLONOPIN) 0.5 MG tablet TAKE 1 TABLET(0.5 MG) BY MOUTH TWICE DAILY AS NEEDED FOR ANXIETY 60 tablet 2   DULoxetine (CYMBALTA) 30 MG capsule Take 1 capsule (30 mg total) by mouth 2 (two) times daily. 180 capsule 1   guaiFENesin-Codeine 200-10 MG/5ML LIQD Take 5 mLs by mouth 4 (four) times daily as needed. (Patient not taking: No sig reported) 210 mL 0   No current facility-administered medications for this visit.    Medication Side Effects: None unless nausea  Allergies: No Known Allergies  Past Medical History:  Diagnosis Date   AAA (abdominal aortic aneurysm) (HCC)    3.1 cm 07/08/18, 3 year follow-up recommended; possible 3 cm AAA by aortogram 09/13/18   Anemia    PMH   Appendicitis with abscess    07/08/18, s/p perc drain; resolved 07/30/18 by CT   Arthritis    Bipolar disorder ([place])    Cerebrovascular disease    intra and extracranial vascular dx per MRI 4/11, neurology rec strict CVRF control   Colonic inertia    Constipation    chronic;severe   Coronary artery disease    Depression    Duodenitis    EKG abnormalities    changes, stress test neg (false EKG changes)   Gastritis    GERD (gastroesophageal reflux disease)    Hypertension    Hypothyroid 01/16/2014   Meningioma ([place]) 10/21/2013   PAD (peripheral artery disease) ([place])    Psoriasis    sees derm   Stroke [facility])    Wears dentures    Wears glasses     Family History  Problem Relation  Age of Onset   Breast cancer Mother        metastisis to bones   Diabetes Son    Heart disease Other        grandfather    Alcohol abuse Brother    Heart disease Brother    Heart disease Maternal Aunt    Lung cancer Brother        smoked   Colon cancer Neg Hx    Esophageal cancer Neg Hx    Stomach cancer Neg Hx     Social History   Socioeconomic History   Marital status: Widowed    Spouse name: Not on file   Number of children: 1   Years of education: Not on file   Highest education level: Not on file  Occupational History   Occupation: retired  Tobacco Use   Smoking status: Every Day    Packs/day: 0.50    Years: 60.00    Pack years: 30.00    Types: Cigarettes    Last attempt to quit: 11/10/2019    Years since quitting: 1.4   Smokeless tobacco: Never  Vaping Use   Vaping Use: Former  Substance and Sexual Activity   Alcohol use: Yes    Alcohol/week: 0.0 standard drinks    Comment: yes on occassion   Drug use: No   Sexual activity: Not Currently  Other Topics Concern   Not on file  Social History Narrative   Brother in law Mr Rexford Maus (one of my patients)   Lives w/ husband       Social Determinants of Health   Financial Resource Strain: Low Risk    Difficulty of Paying Living Expenses: Not hard at all  Food  Insecurity: No Food Insecurity   Worried About Charity fundraiser in the Last Year: Never true   Ran Out of Food in the Last Year: Never true  Transportation Needs: No Transportation Needs   Lack of Transportation (Medical): No   Lack of Transportation (Non-Medical): No  Physical Activity: Inactive   Days of Exercise per Week: 0 days   Minutes of Exercise per Session: 0 min  Stress: Not on file  Social Connections: Not on file  Intimate Partner Violence: Not on file    Past Medical History, Surgical history, Social history, and Family history were reviewed and updated as appropriate.   Please see review of systems for further details on the  patient's review from today.   Objective:   Physical Exam:  There were no vitals taken for this visit.  Physical Exam Neurological:     Mental Status: She is alert and oriented to person, place, and time.     Cranial Nerves: No dysarthria.  Psychiatric:        Attention and Perception: She is inattentive. She does not perceive auditory hallucinations.        Mood and Affect: Mood is anxious and depressed.        Speech: Speech normal. Speech is not rapid and pressured or slurred.        Behavior: Behavior is not slowed or aggressive. Behavior is cooperative.        Thought Content: Thought content normal. Thought content is not paranoid or delusional. Thought content does not include homicidal or suicidal ideation. Thought content does not include homicidal or suicidal plan.        Cognition and Memory: Memory is impaired. She exhibits impaired recent memory.        Judgment: Judgment is not impulsive or inappropriate.     Comments:   Still depressed, anxious and less irritable.  Insight and judgment are fair Talkative Gets meds mixed up at times but thinks she's better.    Lab Review:     Component Value Date/Time   NA 135 01/02/2020 1602   K 4.5 01/02/2020 1602   CL 98 01/02/2020 1602   CO2 25 01/02/2020 1602   GLUCOSE 96 01/02/2020 1602   GLUCOSE 120 (H) 11/26/2019 0650   BUN 17 01/02/2020 1602   CREATININE 1.08 (H) 01/02/2020 1602   CREATININE 0.77 07/28/2015 1805   CALCIUM 9.9 01/02/2020 1602   PROT 7.9 01/02/2020 1602   ALBUMIN 4.2 01/02/2020 1602   AST 15 01/02/2020 1602   ALT 12 01/02/2020 1602   ALKPHOS 87 01/02/2020 1602   BILITOT 0.2 01/02/2020 1602   GFRNONAA 50 (L) 01/02/2020 1602   GFRAA 58 (L) 01/02/2020 1602       Component Value Date/Time   WBC 8.5 01/02/2020 1602   WBC 12.5 (H) 11/26/2019 0650   RBC 4.03 01/02/2020 1602   RBC 3.79 (L) 11/26/2019 0650   HGB 11.9 01/02/2020 1602   HCT 36.4 01/02/2020 1602   PLT 370 01/02/2020 1602   MCV 90  01/02/2020 1602   MCH 29.5 01/02/2020 1602   MCH 29.6 11/26/2019 0650   MCHC 32.7 01/02/2020 1602   MCHC 32.5 11/26/2019 0650   RDW 14.0 01/02/2020 1602   LYMPHSABS 2.7 01/02/2020 1602   MONOABS 0.4 11/23/2019 1957   EOSABS 0.1 01/02/2020 1602   BASOSABS 0.0 01/02/2020 1602    No results found for: POCLITH, LITHIUM   No results found for: PHENYTOIN, PHENOBARB, VALPROATE, CBMZ   .res  Assessment: Plan:    Major depressive disorder, recurrent episode, moderate ([place]) - Plan: DULoxetine (CYMBALTA) 30 MG capsule  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  Generalized anxiety disorder - Plan: clonazePAM (KLONOPIN) 0.5 MG tablet, DULoxetine (CYMBALTA) 30 MG capsule  Delayed sleep phase syndrome - Plan: clonazePAM (KLONOPIN) 0.5 MG tablet  History of CVA (cerebrovascular accident)  Bereavement  Nicotine dependence, cigarettes, with unspecified nicotine-induced disorders  Restless legs syndrome - Plan: clonazePAM (KLONOPIN) 0.5 MG tablet  Severe episode of recurrent major depressive disorder, without psychotic features ([place]) - Plan: DULoxetine (CYMBALTA) 30 MG capsule  Gissella has some chronic depression and chronic irregular sleep patterns which have been resistant to treatment.  Her chronic depression and anxiety are complicated now by double grief of loss of her sister and now her husband.  She is lonely and mood affected by recent severe mood problems.    Continue lamotrigine increase to 100 mg as recommended and use the small tablets to try to help mood and make it easier to swallow.  Purpose to help mood.  She felt it helped.  Will leave duloxetine at 30 low dose.  Increasing it will not help dizziness. No other med changes  Cont counseling for grief, depression, anxiety, and health crises.  Supportive therapy dealing with daughter.    She stopped the donepezil bc GI upset.  Consider Exelon patch for cognition.  But defer this because she is  not in a condition to start a new medication at this time.  Dizziness is likely DT prior strokes and not meds.  Wants to increase duloxetine to 60 mg daily but split dose bc of nausea easily.  Continue ropinirole 2 mg for RLS.  This is better controlled her restless legs.  Disc SE incl hallucinations and dizziness.   This high dosage appears medically necessary.  Call if this fails and will consider doing something with the clonazepam either increasing it or perhaps switching to another benzodiazepine which might have less cognitive problems.  Such as lorazepam Continue clonazepam at LED at 0.25-0.5 mg BID prn anxiety. We discussed the short-term risks associated with benzodiazepines including sedation and increased fall risk among others.  Discussed long-term side effect risk including dependence, potential withdrawal symptoms, and the potential eventual dose-related risk of dementia.  But recent studies from 2020 dispute this association between benzodiazepines and dementia risk. Newer studies in 2020 do not support an association with dementia.  Emphasized importance and proper nutrition and sleep and protein.  Doesn't eat much meat bc constipation but will eat fish.  Discussed how protein is necessary for the proper response to depression medicines.  Can't cook easily.    FU 3-4 mos.  Lynder Parents, MD, DFAPA  Please see After Visit Summary for patient specific instructions.  Future Appointments  Date Time Provider [place]  04/20/2021  3:30 PM [facility]-CCM CARE [facility]  06/16/2021  3:00 PM Cottle, Billey Co., MD CP-CP None    No orders of the defined types were placed in this encounter.     -------------------------------

## 2021-04-20 ENCOUNTER — Telehealth: Payer: Medicare Other

## 2021-04-20 ENCOUNTER — Telehealth: Payer: Self-pay

## 2021-04-20 NOTE — Telephone Encounter (Signed)
  Care Management   Follow Up Note   04/20/2021 Name: [name] MRN: [id number] DOB: [birth year]   Referred by: [name], MD Reason for referral : Chronic Care Management (RNCM: Follow up Outreach Chronic Care Management and coordination needs-Unsuccessful)   An unsuccessful telephone outreach was attempted today. The patient was referred to the case management team for assistance with care management and care coordination.   Follow Up Plan: The care management team will reach out to the patient again over the next 30 days.   Peter Garter RN, BSN,CCM, CDE Care Management Coordinator [place] [phone], Mobile [phone]

## 2021-04-21 ENCOUNTER — Telehealth: Payer: Self-pay | Admitting: *Deleted

## 2021-04-21 NOTE — Chronic Care Management (AMB) (Signed)
  Care Management   Note  04/21/2021 Name: [name] MRN: [id number] DOB: Aug 07, 1946  [name] is a 75 y.o. year old female who is a primary care patient of [name], MD and is actively engaged with the care management team. I reached out to [name] by phone today to assist with re-scheduling a follow up visit with the RN Case Manager  Follow up plan: Unsuccessful telephone outreach attempt made. A HIPAA compliant phone message was left for the patient providing contact information and requesting a return call.   Julian Hy, [place] Management  Direct Dial: [phone]

## 2021-04-22 ENCOUNTER — Telehealth: Payer: Self-pay | Admitting: Psychiatry

## 2021-04-22 NOTE — Telephone Encounter (Signed)
Pt takes klonopin .'5mg'$  and '30mg'$  Cymbalta at 10pm each night and she feels that taking it together causes her problems with keeping her awake. Can she take the Cymbalta night dose any earlier? Will this help with her falling asleep?

## 2021-04-22 NOTE — Telephone Encounter (Signed)
Pt informed

## 2021-04-22 NOTE — Telephone Encounter (Signed)
Please review

## 2021-04-22 NOTE — Telephone Encounter (Signed)
Yes occ Cymbalta will keep people awake.  She can take it as early as she finds necessary

## 2021-04-26 ENCOUNTER — Other Ambulatory Visit: Payer: Self-pay

## 2021-04-26 ENCOUNTER — Telehealth: Payer: Self-pay | Admitting: Psychiatry

## 2021-04-26 DIAGNOSIS — G2581 Restless legs syndrome: Secondary | ICD-10-CM

## 2021-04-26 MED ORDER — ROPINIROLE HCL 2 MG PO TABS: 2.0000 mg | ORAL_TABLET | Freq: Every evening | ORAL | 1 refills | Status: DC

## 2021-04-26 NOTE — Telephone Encounter (Signed)
Rx sent 

## 2021-04-26 NOTE — Telephone Encounter (Signed)
Pt called and asked for a refill on her ropinirole 2 mg  to be sent to the [facility] on  [address]

## 2021-04-26 NOTE — Chronic Care Management (AMB) (Signed)
  Care Management   Note  04/26/2021 Name: [name] MRN: [id number] DOB: 03/25/1946  [name] is a 75 y.o. year old female who is a primary care patient of [name], MD and is actively engaged with the care management team. I reached out to [name] by phone today to assist with re-scheduling a follow up visit with the RN Case Manager  Follow up plan: Telephone appointment with care management team member scheduled for: 05/04/2021  Julian Hy, [place], [place] Management  Direct Dial: [phone]

## 2021-04-29 ENCOUNTER — Telehealth: Payer: Self-pay

## 2021-04-29 NOTE — Telephone Encounter (Signed)
FYI:   Patient called in today to cancel her appt with the CCM team that was scheduled for 8/24, stating she as disappointed in their services.  States she has left VM and has not got return calls.  I informed patient that from looking at the notes they have tried to reach out to her.  I asked patient is she would like to keep appt to discuss care further with the CCM team or if she would like me to reach out to the team in regard.  Patient stated she only wanted to cancel appointment.    Patient had appt with Dr. Carlota Raspberry 9/19.  Patient requested to move it up.  States she has had ongoing issues for the last year since being hospitalized for sepsis a year prior.  States the main concerns are dizziness and no energy.  States Dr. Carlota Raspberry is aware of this issue.  States she has missed several appts and has been advised to go to the hospital and has not in the past due to how she feels.    I have moved patient up into a same day slot on 8/31 at 4pm (patient needed a late afternoon appt).    Please follow up with patient if needed.

## 2021-05-04 ENCOUNTER — Telehealth: Payer: Medicare Other

## 2021-05-11 ENCOUNTER — Encounter: Payer: Self-pay | Admitting: Family Medicine

## 2021-05-11 ENCOUNTER — Ambulatory Visit (INDEPENDENT_AMBULATORY_CARE_PROVIDER_SITE_OTHER): Payer: Medicare Other | Admitting: Family Medicine

## 2021-05-11 ENCOUNTER — Other Ambulatory Visit: Payer: Self-pay

## 2021-05-11 VITALS — BP 138/76 | HR 65 | Temp 98.3°F | Resp 15 | Ht 66.0 in

## 2021-05-11 DIAGNOSIS — L299 Pruritus, unspecified: Secondary | ICD-10-CM | POA: Diagnosis not present

## 2021-05-11 DIAGNOSIS — R5383 Other fatigue: Secondary | ICD-10-CM

## 2021-05-11 DIAGNOSIS — F329 Major depressive disorder, single episode, unspecified: Secondary | ICD-10-CM | POA: Diagnosis not present

## 2021-05-11 DIAGNOSIS — I5033 Acute on chronic diastolic (congestive) heart failure: Secondary | ICD-10-CM

## 2021-05-11 DIAGNOSIS — R55 Syncope and collapse: Secondary | ICD-10-CM

## 2021-05-11 DIAGNOSIS — J84112 Idiopathic pulmonary fibrosis: Secondary | ICD-10-CM

## 2021-05-11 DIAGNOSIS — R29898 Other symptoms and signs involving the musculoskeletal system: Secondary | ICD-10-CM | POA: Diagnosis not present

## 2021-05-11 DIAGNOSIS — I10 Essential (primary) hypertension: Secondary | ICD-10-CM | POA: Diagnosis not present

## 2021-05-11 DIAGNOSIS — H6123 Impacted cerumen, bilateral: Secondary | ICD-10-CM

## 2021-05-11 NOTE — Progress Notes (Signed)
Subjective:  Patient ID: [name], female    DOB: [birth year]  Age: 75 y.o. MRN: [id number]  CC:  Chief Complaint  Patient presents with   Depression    Pt reports here for follow up on sepsis notes this is chronic issue, still having weakness in her lower extremities, pt notes very depressed after passing of her sister and husband.     HPI [name] presents for   Fatigue, depression. See last visit on June 9.  Complicated past medical history including emphysema, chronic respiratory failure, peripheral vascular disease, chronic anxiety, depression, CHF, previous hospitalization for septic shock, acute kidney injury, acute on chronic diastolic CHF and idiopathic pulmonary fibrosis.  Increased fatigue over the previous week when we discussed on video visit along with reported elevated blood pressures.  She had self reduced her medications.  Had reported bilateral leg weakness with walking previous week.  Shuffling with walking in the previous week.  Increased fatigue previous week.  With the acute change in symptoms including leg weakness and difficulty walking, worsening fatigue, recommended emergency room evaluation to rule out infectious cause as well as updated labs.  Hypertensive emergency versus urgency given elevated blood pressures. She was not seen in the ER after our visit. She did not want to wait in ER.  Has had continued follow-up with her therapist, Clinton Quant.  Psychiatrist Dr. Curt Jews.  Most recent visit August 2. She had originally been evaluated and treated by chronic care management but she canceled her appointment for August 24 visit.  Phone note regarding dizziness and low energy noted.  Both legs still feel weak, R more than left. No slurred speech. Both arms feel weaker as getting older, but no focal weakness or acute changes.  Ambulating without assistive device in office- uses a cane at times at times for balance.  Fatigue with prolonged walking.  No  slurred speech or facial droop.  Blood pressures variable. Up to 123456 unknown diastolic. Not taking amlodipine.  Has decreased cymbalta to '30mg'$  because of nausea at higher dose - more dizzy at lower dose.  No recent falls or syncope. Some episodes of near syncope - most recent last week. Sometimes at rest. No associated CP/palpitations. Not feeling anxious or stressed at that time. Past few weeks - 2 episodes. Denies symptoms in office today. Chronic dizziness. Drinking water throughout the day. Frozen meals at times, some days not eating - not feeling hungry. Has sufficient resources for food and shopping. Sometimes not wanting to cook. Take out at times. Has considered meals on wheels.  Takes linzess to stay regular. That has been doing ok. GI: Dr. Havery Moros.  Uses oxygen about once per week when feeling short of breath (not feeling short of breath with lightheadedness or dizzy spells).  No recent labs.  Would like to meet with [name] with [facility] again.   Pulm eval in May - Dr. Gala Murdoch.  No recent cardiology eval - wants to see different cardiologist.  Magnesium 1.6 in March 2021  Lab Results  Component Value Date   WBC 7.0 05/11/2021   HGB 14.1 05/11/2021   HCT 42.9 05/11/2021   MCV 89.3 05/11/2021   PLT 223.0 05/11/2021   Lab Results  Component Value Date   CREATININE 1.14 05/11/2021   Left ear itching -many months.  Some bleeding from left ear few times past weeks - usually after scratching at area.  No qtips in ear, but cleans with cloth at times. Hears, but muted on  that side. Long time.  No pain in the ear.     History Patient Active Problem List   Diagnosis Date Noted   Centrilobular emphysema ([place]) 10/19/2020   Chronic respiratory failure with hypoxia ([place]) 12/26/2019   Shortness of breath 12/26/2019   Bilateral pulmonary infiltrates on CXR    Complicated UTI (urinary tract infection) 11/11/2019   AKI (acute kidney injury) ([place]) 11/11/2019   Hyponatremia 11/11/2019    Elevated LFTs 11/11/2019   Sepsis ([place]) 11/11/2019   Wound infection 11/07/2018   Postoperative pain    Sleep disturbance    Tobacco abuse    Acute blood loss anemia    Hypoalbuminemia due to protein-calorie malnutrition ([place])    Debility 10/24/2018   Pre-operative cardiovascular examination 09/26/2018   HLD (hyperlipidemia) 09/26/2018   GAD (generalized anxiety disorder) 08/07/2018   Major depressive disorder, single episode 08/07/2018   Appendiceal abscess 07/08/2018   PVD (peripheral vascular disease) ([place]) 03/15/2018   Atherosclerosis of native artery of left lower extremity with intermittent claudication ([place]) 03/15/2018   Chronic left-sided low back pain with left-sided sciatica 12/25/2017   Facial droop    History of CVA (cerebrovascular accident) 10/23/2017   Critical lower limb ischemia ([place]) 11/08/2016   Smoker 11/08/2016   Postinflammatory pulmonary fibrosis ([place]) 02/14/2016   Cigarette smoker 12/24/2015   Hypothyroid ? 01/16/2014   Mild cognitive impairment 01/16/2014   Meningioma ([place]) 10/21/2013   Chest pain 10/20/2013   Medicare annual wellness visit, subsequent 06/16/2013   Dizziness and giddiness 06/16/2013   RLS (restless legs syndrome) 04/29/2012   Abdominal pain 12/27/2010   DEGENERATIVE JOINT DISEASE 09/23/2010   CAROTID ARTERY DISEASE 08/15/2010   CHEST PAIN 08/15/2010   HEADACHE 12/02/2009   BACK PAIN 11/22/2009   CONSTIPATION, CHRONIC 01/29/2008   NAUSEA 01/28/2008   DEPRESSION 03/08/2007   HTN (hypertension) 03/08/2007   Past Medical History:  Diagnosis Date   AAA (abdominal aortic aneurysm) ([place])    3.1 cm 07/08/18, 3 year follow-up recommended; possible 3 cm AAA by aortogram 09/13/18   Anemia    PMH   Appendicitis with abscess    07/08/18, s/p perc drain; resolved 07/30/18 by CT   Arthritis    Bipolar disorder ([place])    Cerebrovascular disease    intra and extracranial vascular dx per MRI 4/11, neurology rec strict CVRF control   Colonic  inertia    Constipation    chronic;severe   Coronary artery disease    Depression    Duodenitis    EKG abnormalities    changes, stress test neg (false EKG changes)   Gastritis    GERD (gastroesophageal reflux disease)    Hypertension    Hypothyroid 01/16/2014   Meningioma ([place]) 10/21/2013   PAD (peripheral artery disease) ([place])    Psoriasis    sees derm   Stroke [facility])    Wears dentures    Wears glasses    Past Surgical History:  Procedure Laterality Date   ABDOMINAL AORTOGRAM W/LOWER EXTREMITY N/A 09/13/2018   Procedure: ABDOMINAL AORTOGRAM W/LOWER EXTREMITY;  Surgeon: Elam Dutch, MD;  Location: [facility];  Service: Cardiovascular;  Laterality: N/A;   arthroscopy  04/2010   Right knee   CATARACT EXTRACTION W/ INTRAOCULAR LENS  IMPLANT, BILATERAL     COLONOSCOPY     FEMORAL-POPLITEAL BYPASS GRAFT  10/22/2018   FEMORAL-POPLITEAL BYPASS GRAFT Left 10/22/2018   Procedure: LEFT FEMORAL TO BELOW THE KNEE POPLITEAL ARTERY BYPASS GRAFT;  Surgeon: Elam Dutch, MD;  Location: [facility];  Service: Vascular;  Laterality: Left;   I & D EXTREMITY Left 11/08/2018   Procedure: IRRIGATION AND DEBRIDEMENT EXTREMITY Left Leg;  Surgeon: Elam Dutch, MD;  Location: [facility] OR;  Service: Vascular;  Laterality: Left;   IR RADIOLOGIST EVAL & MGMT  07/30/2018   MULTIPLE TOOTH EXTRACTIONS     TUBAL LIGATION     No Known Allergies Prior to Admission medications   Medication Sig Start Date End Date Taking? Authorizing Provider  albuterol (VENTOLIN HFA) 108 (90 Base) MCG/ACT inhaler Inhale 1-2 puffs into the lungs every 6 (six) hours as needed for wheezing or shortness of breath. 10/19/20  Yes Martyn Ehrich, NP  amLODipine (NORVASC) 2.5 MG tablet Take 1 tablet (2.5 mg total) by mouth daily. 11/28/19  Yes Gherghe, Vella Redhead, MD  clonazePAM (KLONOPIN) 0.5 MG tablet TAKE 1 TABLET(0.5 MG) BY MOUTH TWICE DAILY AS NEEDED FOR ANXIETY 04/12/21  Yes Cottle, Billey Co., MD  DULoxetine (CYMBALTA) 30  MG capsule Take 1 capsule (30 mg total) by mouth 2 (two) times daily. 04/12/21  Yes Cottle, Billey Co., MD  famotidine (PEPCID) 20 MG tablet One at bedtime 02/14/16  Yes Tanda Rockers, MD  guaiFENesin-Codeine 200-10 MG/5ML LIQD Take 5 mLs by mouth 4 (four) times daily as needed. 01/17/21  Yes Olalere, Adewale A, MD  lamoTRIgine (LAMICTAL) 25 MG tablet Take 2 tablets (50 mg total) by mouth daily. 4 daily. Patient taking differently: Take 50 mg by mouth daily. 01/03/21  Yes Cottle, Billey Co., MD  linaclotide [facility]) 145 MCG CAPS capsule Take 1 capsule (145 mcg total) by mouth daily before breakfast. 02/10/21  Yes Armbruster, Carlota Raspberry, MD  Melatonin 10 MG TABS Take 20 mg by mouth at bedtime.   Yes [provider]  rivaroxaban (XARELTO) 20 MG TABS tablet Take 1 tablet (20 mg total) by mouth daily with supper. 02/23/20  Yes [name], MD  rOPINIRole (REQUIP) 2 MG tablet Take 1 tablet (2 mg total) by mouth every evening. 04/26/21  Yes Cottle, Billey Co., MD   Social History   Socioeconomic History   Marital status: Widowed    Spouse name: Not on file   Number of children: 1   Years of education: Not on file   Highest education level: Not on file  Occupational History   Occupation: retired  Tobacco Use   Smoking status: Every Day    Packs/day: 0.50    Years: 60.00    Pack years: 30.00    Types: Cigarettes    Last attempt to quit: 11/10/2019    Years since quitting: 1.5   Smokeless tobacco: Never  Vaping Use   Vaping Use: Former  Substance and Sexual Activity   Alcohol use: Yes    Alcohol/week: 0.0 standard drinks    Comment: yes on occassion   Drug use: No   Sexual activity: Not Currently  Other Topics Concern   Not on file  Social History Narrative   Brother in law Mr Rexford Maus (one of my patients)   Lives w/ husband       Social Determinants of Health   Financial Resource Strain: Low Risk    Difficulty of Paying Living Expenses: Not hard at all  Food  Insecurity: No Food Insecurity   Worried About Charity fundraiser in the Last Year: Never true   [place] in the Last Year: Never true  Transportation Needs: No Transportation Needs  Lack of Transportation (Medical): No   Lack of Transportation (Non-Medical): No  Physical Activity: Inactive   Days of Exercise per Week: 0 days   Minutes of Exercise per Session: 0 min  Stress: Not on file  Social Connections: Not on file  Intimate Partner Violence: Not on file    Review of Systems Per hpi   Objective:   Vitals:   05/11/21 1602  BP: 138/76  Pulse: 65  Resp: 15  Temp: 98.3 F (36.8 C)  TempSrc: Temporal  SpO2: 96%  Height: '5\' 6"'$  (1.676 m)     Physical Exam Vitals reviewed.  Constitutional:      Appearance: Normal appearance. She is well-developed.  HENT:     Head: Normocephalic and atraumatic.  Eyes:     Conjunctiva/sclera: Conjunctivae normal.     Pupils: Pupils are equal, round, and reactive to light.  Neck:     Vascular: No carotid bruit.  Cardiovascular:     Rate and Rhythm: Normal rate and regular rhythm.     Heart sounds: Normal heart sounds.  Pulmonary:     Effort: Pulmonary effort is normal.     Breath sounds: Normal breath sounds.  Abdominal:     Palpations: Abdomen is soft. There is no pulsatile mass.     Tenderness: There is no abdominal tenderness.  Musculoskeletal:     Right lower leg: No edema.     Left lower leg: No edema.     Comments: Lower extremities bilaterally with equal strength, equal reflexes.  Skin:    General: Skin is warm and dry.  Neurological:     General: No focal deficit present.     Mental Status: She is alert and oriented to person, place, and time.     Comments: No focal deficit, no facial droop, appropriate responses, normal speech.  Upper extremity strength, lower extremity strength equal bilaterally.  No pronator drift.  Psychiatric:        Mood and Affect: Mood normal.        Behavior: Behavior normal.      EKG, sinus rhythm.  ST depression lead V6, elevation in V1 through 4.  But also noted on previous EKG 11/16/2019.  Minimal to no significant change from that EKG. no apparent acute findings.  56 minutes spent during visit, prior note and lab review, including chart review, counseling and assimilation of information, exam, discussion of plan, and chart completion.    Assessment & Plan:  [name] is a 75 y.o. female . Near syncope - Plan: CBC, Comprehensive metabolic panel, EKG XX123456, Ambulatory referral to Cardiology Fatigue, unspecified type - Plan: CBC, Comprehensive metabolic panel, EKG [id number], Ambulatory referral to Cardiology Essential hypertension Idiopathic pulmonary fibrosis (HCC) Acute on chronic diastolic heart failure ([place]) - Plan: Ambulatory referral to Cardiology Weakness of both lower extremities  Weakness appears to be more generalized, no focal weakness on exam.  May be related to fatigue which appears to be multifactorial including with her depression, possibly some deconditioning, underlying pulmonary fibrosis and CHF.  Does not appear to be fluid overloaded in office.  EKG overall similar to previous readings but has not had recent cardiology follow-up.  Denies true food insecurity but variable diet.  Preparation of food may be a limitation.  Phone number provided for Meals on Wheels as well as phone number provided for connect care management.  - With reports of increased fatigue, near syncope, will have her see cardiology initially, testing as above including CBC, CMP, will  check magnesium with prior history of hypomagnesemia, and ER precautions given.  I will also follow-up with her soon.  Major depressive disorder, remission status unspecified, unspecified whether recurrent  -Continue follow-up with psychiatry.  May be contributing to symptoms above.  Itching of ear - Plan: Ambulatory referral to ENT Bilateral impacted cerumen - Plan: Ambulatory referral to  ENT  Bilateral impacted cerumen, offered appointment for cerumen lavage but she would like to have this done through ENT.  Referral placed  Hypomagnesemia - Plan: Magnesium  -As above, check level.  No orders of the defined types were placed in this encounter.  Patient Instructions  I will check some labs, but if any worsening feeling of dizziness or that you may pass out - be seen at an emergency room.  I will refer you to ear nose and throat for the hearing difficulty and cerumen impaction/extra labs.  If any acute changes be seen right away.  I have placed a referral to cardiology also discussed the dizziness and follow-up of congestive heart failure.  If any chest pains, or acute changes in breathing be seen at the emergency room or call 911.  Please follow-up with me in 1 month.   Return to the clinic or go to the nearest emergency room if any of your symptoms worsen or new symptoms occur.   Meals on Wheels may be an option for food if needed: Contact the SeniorLine [phone]  Here is the number for [name] Sandlin:  Peter Garter RN, Jackquline Denmark, CDE  Care Management Coordinator  [facility]  [phone], Mobile [phone]    Signed,   Merri Ray, MD [place] 05/13/21 11:19 AM

## 2021-05-11 NOTE — Patient Instructions (Addendum)
I will check some labs, but if any worsening feeling of dizziness or that you may pass out - be seen at an emergency room.  I will refer you to ear nose and throat for the hearing difficulty and cerumen impaction/extra labs.  If any acute changes be seen right away.  I have placed a referral to cardiology also discussed the dizziness and follow-up of congestive heart failure.  If any chest pains, or acute changes in breathing be seen at the emergency room or call 911.  Please follow-up with me in 1 month.   Return to the clinic or go to the nearest emergency room if any of your symptoms worsen or new symptoms occur.   Meals on Wheels may be an option for food if needed: Contact the SeniorLine [phone]  Here is the number for [name] Sandlin:  Peter Garter RN, [facility], CDE  Care Management Coordinator  [place]  [phone], Mobile [phone]

## 2021-05-12 LAB — CBC
HCT: 42.9 % (ref 36.0–46.0)
Hemoglobin: 14.1 g/dL (ref 12.0–15.0)
MCHC: 32.9 g/dL (ref 30.0–36.0)
MCV: 89.3 fl (ref 78.0–100.0)
Platelets: 223 10*3/uL (ref 150.0–400.0)
RBC: 4.8 Mil/uL (ref 3.87–5.11)
RDW: 14.6 % (ref 11.5–15.5)
WBC: 7 10*3/uL (ref 4.0–10.5)

## 2021-05-12 LAB — COMPREHENSIVE METABOLIC PANEL
ALT: 11 U/L (ref 0–35)
AST: 18 U/L (ref 0–37)
Albumin: 4 g/dL (ref 3.5–5.2)
Alkaline Phosphatase: 72 U/L (ref 39–117)
BUN: 21 mg/dL (ref 6–23)
CO2: 27 mEq/L (ref 19–32)
Calcium: 9.8 mg/dL (ref 8.4–10.5)
Chloride: 97 mEq/L (ref 96–112)
Creatinine, Ser: 1.14 mg/dL (ref 0.40–1.20)
GFR: 46.42 mL/min — ABNORMAL LOW (ref >60.00)
Glucose, Bld: 92 mg/dL (ref 70–99)
Potassium: 4 mEq/L (ref 3.5–5.1)
Sodium: 133 mEq/L — ABNORMAL LOW (ref 135–145)
Total Bilirubin: 0.5 mg/dL (ref 0.2–1.2)
Total Protein: 7.6 g/dL (ref 6.0–8.3)

## 2021-05-12 LAB — MAGNESIUM: Magnesium: 1.9 mg/dL (ref 1.5–2.5)

## 2021-05-13 ENCOUNTER — Encounter: Payer: Self-pay | Admitting: Family Medicine

## 2021-05-13 NOTE — Telephone Encounter (Signed)
Not sure I understand the question.  There would not be a result on her EKG as we discussed that in the office.  Let me know if I am missing something. -JG

## 2021-05-24 ENCOUNTER — Ambulatory Visit (INDEPENDENT_AMBULATORY_CARE_PROVIDER_SITE_OTHER): Payer: Medicare Other | Admitting: Psychiatry

## 2021-05-24 DIAGNOSIS — F331 Major depressive disorder, recurrent, moderate: Secondary | ICD-10-CM

## 2021-05-24 DIAGNOSIS — Z599 Problem related to housing and economic circumstances, unspecified: Secondary | ICD-10-CM | POA: Diagnosis not present

## 2021-05-24 DIAGNOSIS — F17219 Nicotine dependence, cigarettes, with unspecified nicotine-induced disorders: Secondary | ICD-10-CM

## 2021-05-24 DIAGNOSIS — G3184 Mild cognitive impairment, so stated: Secondary | ICD-10-CM | POA: Diagnosis not present

## 2021-05-24 DIAGNOSIS — G4721 Circadian rhythm sleep disorder, delayed sleep phase type: Secondary | ICD-10-CM | POA: Diagnosis not present

## 2021-05-24 DIAGNOSIS — Z8619 Personal history of other infectious and parasitic diseases: Secondary | ICD-10-CM

## 2021-05-24 DIAGNOSIS — Z8673 Personal history of transient ischemic attack (TIA), and cerebral infarction without residual deficits: Secondary | ICD-10-CM | POA: Diagnosis not present

## 2021-05-24 DIAGNOSIS — I739 Peripheral vascular disease, unspecified: Secondary | ICD-10-CM | POA: Diagnosis not present

## 2021-05-24 DIAGNOSIS — F411 Generalized anxiety disorder: Secondary | ICD-10-CM

## 2021-05-24 DIAGNOSIS — Z62820 Parent-biological child conflict: Secondary | ICD-10-CM

## 2021-05-24 DIAGNOSIS — J84112 Idiopathic pulmonary fibrosis: Secondary | ICD-10-CM

## 2021-05-24 NOTE — Progress Notes (Signed)
Psychotherapy Progress Note [facility] Jenny Moore, PhD LP  Patient ID: [name]     MRN: [id number] Therapy format: Individual psychotherapy Date: 05/24/2021      Start: 3:21p     Stop: 4:10p     Time Spent: 49 min Location: Telehealth visit -- I connected with this patient by an approved telecommunication method (audio only), with her informed consent, and verifying identity and patient privacy.  I was located at my office and patient at her home.  As needed, we discussed the limitations, risks, and security and privacy concerns associated with telehealth service, including the availability and conditions which currently govern in-person appointments and the possibility that 3rd-party payment may not be fully guaranteed and she may be responsible for charges.  After she indicated understanding, we proceeded with the session.  Also discussed treatment planning, as needed, including ongoing verbal agreement with the plan, the opportunity to ask and answer all questions, her demonstrated understanding of instructions, and her readiness to call the office should symptoms worsen or she feels she is in a crisis state and needs more immediate and tangible assistance.   Session narrative (presenting needs, interim history, self-report of stressors and symptoms, applications of prior therapy, status changes, and interventions made in session) Not so good.  Acknowledged cardiopulmonary concerns.  Saw PCP 2 weeks ago, will be getting referral to pulmonology.  Has overcome the horrible vascular pain in her leg, now on regular blood thinner.  Oxygen status good the past week, surprisingly without using inhalers.  Reckoning still with the shocks of the past year and change, recalls the delirium she was in with sepsis, still hard to fathom what she experienced and reconcile memory loss and realistic illusion memories.  Validated as traumatic.  Admits she is angry that the deceased loved  ones who visited her (supernaturally) didn't take her home (afterlife).  BIL [name] does dog walking and shopping errands for her, routinely.  Generally good to her, sometime but heads pridefully.  Often resents, but does not speak, about her sister's family being dominated by [name]'s [place] culture; ruminates sometimes about her/their [place] heritage being erased, and her sister's legacy being forgotten.  Gently confronted, suggesting that sister does not need her legacy defended, but if the point is that she is personally offended, best to put it that way if she addresses it.  Wants advice about an attorney bill [name]), now over $10K with interest, concerning her defaulting on a real estate contract to sell.  Advised ask directly about settling the bill for charges she agrees with.  Failing that, registered letter declaring her agreement and disagreement and paying the part she accepts.  Failing that, may resort to a bar complaint for charging without informed consent.    Displeased with son [name], who says he has lost the valuable documents she sent home with him for safekeeping.    Father died at 71, same age she is now.    Therapeutic modalities: Cognitive Behavioral Therapy, Solution-Oriented/Positive Psychology, Ego-Supportive, and Assertiveness/Communication  Mental Status/Observations:  Appearance:   Casual and less grooming      Behavior:  Appropriate  Motor:  grossly intact  Speech/Language:   Clear and Coherent and minor dysfluencies/word finding  Affect:  Appropriate  Mood:  depressed and irritable  Thought process:  normal  Thought content:    Some rumination  Sensory/Perceptual disturbances:    WNL  Orientation:  Fully oriented  Attention:  Good    Concentration:  Fair  Memory:  WNL  Insight:    Fair  Judgment:   Fair  Impulse Control:  Good   Risk Assessment: Danger to Self: No Self-injurious Behavior: No Danger to Others: No Physical Aggression / Violence:  No Duty to Warn: No Access to Firearms a concern: No  Assessment of progress:  stabilized  Diagnosis:   ICD-10-CM   1. Major depressive disorder, recurrent episode, moderate (HCC)  F33.1     2. 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  G31.84     3. Generalized anxiety disorder  F41.1     4. 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 History of CVA (cerebrovascular accident)  Z86.73     6. Nicotine dependence, cigarettes, with unspecified nicotine-induced disorders  F17.219     7. Relationship problem between parent and child  Z62.820     8. History of sepsis  Z86.19     9. PVD (peripheral vascular disease) (HCC)  I73.9     10. Idiopathic pulmonary fibrosis (HCC) (per chart; pt self-describes COPD)  J84.112     11. Financial problems  Z59.9      Plan:  Maintain self-care as directed, follow through on pulmonology.  May need, benefit from respiratory therapy.  Should quit smoking. Try to shift sleep earlier Start looking into supported independent living to understand the actual costs and possibilities -- the house is too much to keep up with Try to let up defending sister's legacy with living relatives she depends on and will not convince Dispute process with attorney's bill -- go on record with registered letter, resort to bar complaint if needed Other recommendations/advice as may be noted above Continue to utilize previously learned skills ad lib Maintain medication as prescribed and work faithfully with relevant prescriber(s) if any changes are desired or seem indicated Call the clinic on-call service, 988/hotline, present to ER, or call 911 if any life-threatening psychiatric crisis No follow-ups on file. Already scheduled visit in this office 06/16/2021.  Jenny Serve, PhD Jenny Moore, PhD LP Clinical Psychologist, [facility] [address], [place], [place]  [phone]

## 2021-05-30 ENCOUNTER — Ambulatory Visit: Payer: Medicare Other | Admitting: Family Medicine

## 2021-05-31 ENCOUNTER — Telehealth: Payer: Self-pay

## 2021-05-31 NOTE — Telephone Encounter (Signed)
Pt wants a different referral to a different cardiologist "[place] doctor only" is what she has requested.  Pt call back [phone]

## 2021-06-02 NOTE — Telephone Encounter (Signed)
If she is choosing not to be seen at [facility], that is her choice. If there are clinical concerns after she meets with a cardiologist, I am happy to discuss that further. If any changes or worsening of symptoms prior to cardiology eval, should be seen here or in emergency room as we discussed last visit.

## 2021-06-03 NOTE — Telephone Encounter (Signed)
Called and left message on patient vm to call office back about cardiologist referral.

## 2021-06-06 NOTE — Telephone Encounter (Signed)
Patient returned call regarding referral please return call in the afternoon,

## 2021-06-07 NOTE — Telephone Encounter (Signed)
No Ma'am! Not anyone in particular. Just [facility] is all she stated and for them to be local like in [place] because of her transportation.

## 2021-06-07 NOTE — Telephone Encounter (Signed)
[name], per pcp is fine with patient choosing another cardiologist. I called patient and informed her that within [facility] all are scheduled out and patient stated that she would like to be seen asap. She did say that she would be willing to be seen in [place] and if not, she would just keep the referral at the original location.

## 2021-06-10 NOTE — Progress Notes (Deleted)
Subjective:   [name] is a 75 y.o. female who presents for Medicare Annual (Subsequent) preventive examination.  I connected with  SHENICKA SUNDERLIN on 06/10/21 by audio enabled telemedicine application and verified that I am speaking with the correct person using two identifiers.   I discussed the limitations of evaluation and management by telemedicine. The patient expressed understanding and agreed to proceed.   Location of Patient: Home Location of Provider: Home   Review of Systems    Defer to PCP       Objective:    There were no vitals filed for this visit. There is no height or weight on file to calculate BMI.  Advanced Directives 03/23/2021 11/17/2019 11/11/2019 10/02/2019 12/26/2018 12/06/2018 11/06/2018  Does Patient Have a Medical Advance Directive? Yes No No No Yes Yes No  Type of Paramedic of [place];Living will - - - [place];Living will [place];Living will [place]  Does patient want to make changes to medical advance directive? No - Patient declined - - - - No - Patient declined No - Patient declined  Copy of [place] in Chart? No - copy requested - - - - - No - copy requested  Would patient like information on creating a medical advance directive? - - No - Patient declined No - Patient declined - - No - Patient declined  Pre-existing out of facility DNR order (yellow form or pink MOST form) - - - - - - -    Current Medications (verified) Outpatient Encounter Medications as of 06/10/2021  Medication Sig   albuterol (VENTOLIN HFA) 108 (90 Base) MCG/ACT inhaler Inhale 1-2 puffs into the lungs every 6 (six) hours as needed for wheezing or shortness of breath.   amLODipine (NORVASC) 2.5 MG tablet Take 1 tablet (2.5 mg total) by mouth daily.   clonazePAM (KLONOPIN) 0.5 MG tablet TAKE 1 TABLET(0.5 MG) BY MOUTH TWICE DAILY AS NEEDED FOR ANXIETY   DULoxetine (CYMBALTA)  30 MG capsule Take 1 capsule (30 mg total) by mouth 2 (two) times daily.   famotidine (PEPCID) 20 MG tablet One at bedtime   guaiFENesin-Codeine 200-10 MG/5ML LIQD Take 5 mLs by mouth 4 (four) times daily as needed.   lamoTRIgine (LAMICTAL) 25 MG tablet Take 2 tablets (50 mg total) by mouth daily. 4 daily. (Patient taking differently: Take 50 mg by mouth daily.)   linaclotide (LINZESS) 145 MCG CAPS capsule Take 1 capsule (145 mcg total) by mouth daily before breakfast.   Melatonin 10 MG TABS Take 20 mg by mouth at bedtime.   rivaroxaban (XARELTO) 20 MG TABS tablet Take 1 tablet (20 mg total) by mouth daily with supper.   rOPINIRole (REQUIP) 2 MG tablet Take 1 tablet (2 mg total) by mouth every evening.   No facility-administered encounter medications on file as of 06/10/2021.    Allergies (verified) Patient has no known allergies.   History: Past Medical History:  Diagnosis Date   AAA (abdominal aortic aneurysm) ([place])    3.1 cm 07/08/18, 3 year follow-up recommended; possible 3 cm AAA by aortogram 09/13/18   Anemia    PMH   Appendicitis with abscess    07/08/18, s/p perc drain; resolved 07/30/18 by CT   Arthritis    Bipolar disorder ([place])    Cerebrovascular disease    intra and extracranial vascular dx per MRI 4/11, neurology rec strict CVRF control   Colonic inertia  Constipation    chronic;severe   Coronary artery disease    Depression    Duodenitis    EKG abnormalities    changes, stress test neg (false EKG changes)   Gastritis    GERD (gastroesophageal reflux disease)    Hypertension    Hypothyroid 01/16/2014   Meningioma ([place]) 10/21/2013   PAD (peripheral artery disease) ([place])    Psoriasis    sees derm   Stroke [facility])    Wears dentures    Wears glasses    Past Surgical History:  Procedure Laterality Date   ABDOMINAL AORTOGRAM W/LOWER EXTREMITY N/A 09/13/2018   Procedure: ABDOMINAL AORTOGRAM W/LOWER EXTREMITY;  Surgeon: Elam Dutch, MD;  Location: [facility];  Service: Cardiovascular;  Laterality: N/A;   arthroscopy  04/2010   Right knee   CATARACT EXTRACTION W/ INTRAOCULAR LENS  IMPLANT, BILATERAL     COLONOSCOPY     FEMORAL-POPLITEAL BYPASS GRAFT  10/22/2018   FEMORAL-POPLITEAL BYPASS GRAFT Left 10/22/2018   Procedure: LEFT FEMORAL TO BELOW THE KNEE POPLITEAL ARTERY BYPASS GRAFT;  Surgeon: Elam Dutch, MD;  Location: [facility] OR;  Service: Vascular;  Laterality: Left;   I & D EXTREMITY Left 11/08/2018   Procedure: IRRIGATION AND DEBRIDEMENT EXTREMITY Left Leg;  Surgeon: Elam Dutch, MD;  Location: [facility];  Service: Vascular;  Laterality: Left;   IR RADIOLOGIST EVAL & MGMT  07/30/2018   MULTIPLE TOOTH EXTRACTIONS     TUBAL LIGATION     Family History  Problem Relation Age of Onset   Breast cancer Mother        metastisis to bones   Diabetes Son    Heart disease Other        grandfather    Alcohol abuse Brother    Heart disease Brother    Heart disease Maternal Aunt    Lung cancer Brother        smoked   Colon cancer Neg Hx    Esophageal cancer Neg Hx    Stomach cancer Neg Hx    Social History   Socioeconomic History   Marital status: Widowed    Spouse name: Not on file   Number of children: 1   Years of education: Not on file   Highest education level: Not on file  Occupational History   Occupation: retired  Tobacco Use   Smoking status: Every Day    Packs/day: 0.50    Years: 60.00    Pack years: 30.00    Types: Cigarettes    Last attempt to quit: 11/10/2019    Years since quitting: 1.5   Smokeless tobacco: Never  Vaping Use   Vaping Use: Former  Substance and Sexual Activity   Alcohol use: Yes    Alcohol/week: 0.0 standard drinks    Comment: yes on occassion   Drug use: No   Sexual activity: Not Currently  Other Topics Concern   Not on file  Social History Narrative   Brother in law Mr Rexford Maus (one of my patients)   Lives w/ husband       Social Determinants of Health   Financial Resource  Strain: Low Risk    Difficulty of Paying Living Expenses: Not hard at all  Food Insecurity: No Food Insecurity   Worried About Charity fundraiser in the Last Year: Never true   Arboriculturist in the Last Year: Never true  Transportation Needs: No Transportation Needs   Lack of Transportation (Medical): No  Lack of Transportation (Non-Medical): No  Physical Activity: Inactive   Days of Exercise per Week: 0 days   Minutes of Exercise per Session: 0 min  Stress: Not on file  Social Connections: Not on file    Tobacco Counseling Ready to quit: Not Answered Counseling given: Not Answered   Clinical Intake:                 Diabetic?No         Activities of Daily Living No flowsheet data found.  Patient Care Team: [name], MD as PCP - General (Family Medicine) Lorretta Harp, MD as PCP - Cardiology (Cardiology) Dimitri Ped, RN as Case Manager  Indicate any recent Medical Services you may have received from other than [facility] providers in the past year (date may be approximate).     Assessment:   This is a routine wellness examination for [name].  Hearing/Vision screen No results found.  Dietary issues and exercise activities discussed:     Goals Addressed   None   Depression Screen PHQ 2/9 Scores 05/11/2021 03/23/2021 02/23/2020 02/20/2020 01/23/2020 12/26/2019 12/25/2019  PHQ - 2 Score 4 2 3 4 2  0 3  PHQ- 9 Score 12 7 15 19 4  0 15    Fall Risk Fall Risk  05/11/2021 03/23/2021 02/23/2020 02/20/2020 01/23/2020  Falls in the past year? 1 1 0 0 0  Comment - - - - -  Number falls in past yr: 0 0 - - -  Comment - 6 months ago - - -  Injury with Fall? 0 0 - - -  Risk for fall due to : History of fall(s);No Fall Risks Impaired mobility - - -  Follow up Falls evaluation completed Education provided;Falls prevention discussed Falls evaluation completed Falls evaluation completed Falls evaluation completed    FALL RISK PREVENTION PERTAINING TO THE  HOME:  Any stairs in or around the home? {YES/NO:21197} If so, are there any without handrails? {YES/NO:21197} Home free of loose throw rugs in walkways, pet beds, electrical cords, etc? {YES/NO:21197} Adequate lighting in your home to reduce risk of falls? {YES/NO:21197}  ASSISTIVE DEVICES UTILIZED TO PREVENT FALLS:  Life alert? {YES/NO:21197} Use of a cane, walker or w/c? {YES/NO:21197} Grab bars in the bathroom? {YES/NO:21197} Shower chair or bench in shower? {YES/NO:21197} Elevated toilet seat or a handicapped toilet? {YES/NO:21197}  TIMED UP AND GO:  Was the test performed? {YES/NO:21197}.  Length of time to ambulate 10 feet: *** sec.   {Appearance of KDXI:3382505}  Cognitive Function:        Immunizations Immunization History  Administered Date(s) Administered   PFIZER(Purple Top)SARS-COV-2 Vaccination 02/20/2020, 03/12/2020   Pneumococcal Polysaccharide-23 10/24/2018   Zoster, Live 08/15/2010    TDAP status: Due, Education has been provided regarding the importance of this vaccine. Advised may receive this vaccine at local pharmacy or Health Dept. Aware to provide a copy of the vaccination record if obtained from local pharmacy or Health Dept. Verbalized acceptance and understanding.  {Flu Vaccine status:2101806}  {Pneumococcal vaccine status:2101807}  Covid-19 vaccine status: Completed vaccines  Qualifies for Shingles Vaccine? Yes   Zostavax completed Yes   Shingrix Completed?: No.    Education has been provided regarding the importance of this vaccine. Patient has been advised to call insurance company to determine out of pocket expense if they have not yet received this vaccine. Advised may also receive vaccine at local pharmacy or Health Dept. Verbalized acceptance and understanding.  Screening Tests Health Maintenance  Topic  Date Due   COVID-19 Vaccine (3 - Booster for Pfizer series) 08/12/2020   INFLUENZA VACCINE  Never done   TETANUS/TDAP  06/23/2021  (Originally 10/18/1964)   Zoster Vaccines- Shingrix (1 of 2) 08/10/2021 (Originally 10/18/1993)   DEXA SCAN  Completed   Hepatitis C Screening  Completed   HPV VACCINES  Aged Out    Health Maintenance  Health Maintenance Due  Topic Date Due   COVID-19 Vaccine (3 - Booster for Pfizer series) 08/12/2020   INFLUENZA VACCINE  Never done    Colorectal cancer screening: No longer required.   Mammogram status: No longer required due to Age.  Bone Density Scan: Complete 12/10/2019  Lung Cancer Screening: (Low Dose CT Chest recommended if Age 65-80 years, 30 pack-year currently smoking OR have quit w/in 15years.) does qualify.   Lung Cancer Screening Referral: No  Additional Screening:  Hepatitis C Screening: does qualify; Completed 06/26/2019  Vision Screening: Recommended annual ophthalmology exams for early detection of glaucoma and other disorders of the eye. Is the patient up to date with their annual eye exam?  {YES/NO:21197} Who is the provider or what is the name of the office in which the patient attends annual eye exams? *** If pt is not established with a provider, would they like to be referred to a provider to establish care? {YES/NO:21197}.   Dental Screening: Recommended annual dental exams for proper oral hygiene  Community Resource Referral / Chronic Care Management: CRR required this visit?  {YES/NO:21197}  CCM required this visit?  {YES/NO:21197}     Plan:     I have personally reviewed and noted the following in the patient's chart:   Medical and social history Use of alcohol, tobacco or illicit drugs  Current medications and supplements including opioid prescriptions.  Functional ability and status Nutritional status Physical activity Advanced directives List of other physicians Hospitalizations, surgeries, and ER visits in previous 12 months Vitals Screenings to include cognitive, depression, and falls Referrals and appointments  In addition, I  have reviewed and discussed with patient certain preventive protocols, quality metrics, and best practice recommendations. A written personalized care plan for preventive services as well as general preventive health recommendations were provided to patient.     Lurlean Nanny, [place]   06/10/2021   Nurse Notes: Non-Face to Face minute visit

## 2021-06-16 ENCOUNTER — Telehealth (INDEPENDENT_AMBULATORY_CARE_PROVIDER_SITE_OTHER): Payer: Medicare Other | Admitting: Psychiatry

## 2021-06-16 ENCOUNTER — Encounter: Payer: Self-pay | Admitting: Psychiatry

## 2021-06-16 DIAGNOSIS — Z62820 Parent-biological child conflict: Secondary | ICD-10-CM

## 2021-06-16 DIAGNOSIS — F331 Major depressive disorder, recurrent, moderate: Secondary | ICD-10-CM | POA: Diagnosis not present

## 2021-06-16 DIAGNOSIS — G3184 Mild cognitive impairment, so stated: Secondary | ICD-10-CM | POA: Diagnosis not present

## 2021-06-16 DIAGNOSIS — T43615D Adverse effect of caffeine, subsequent encounter: Secondary | ICD-10-CM

## 2021-06-16 DIAGNOSIS — G4721 Circadian rhythm sleep disorder, delayed sleep phase type: Secondary | ICD-10-CM | POA: Diagnosis not present

## 2021-06-16 DIAGNOSIS — G2581 Restless legs syndrome: Secondary | ICD-10-CM | POA: Diagnosis not present

## 2021-06-16 DIAGNOSIS — F411 Generalized anxiety disorder: Secondary | ICD-10-CM | POA: Diagnosis not present

## 2021-06-16 DIAGNOSIS — Z8673 Personal history of transient ischemic attack (TIA), and cerebral infarction without residual deficits: Secondary | ICD-10-CM

## 2021-06-16 NOTE — Progress Notes (Signed)
[name] [id number] 1945/09/20 75 y.o.   Virtual Visit via Telephone Note  I connected with pt by telephone and verified that I am speaking with the correct person using two identifiers.   I discussed the limitations, risks, security and privacy concerns of performing an evaluation and management service by telephone and the availability of in person appointments. I also discussed with the patient that there may be a patient responsible charge related to this service. The patient expressed understanding and agreed to proceed.  I discussed the assessment and treatment plan with the patient. The patient was provided an opportunity to ask questions and all were answered. The patient agreed with the plan and demonstrated an understanding of the instructions.   The patient was advised to call back or seek an in-person evaluation if the symptoms worsen or if the condition fails to improve as anticipated.  I provided 30 minutes of non-face-to-face time during this encounter. The call started at 3:00 and ended at 330. The patient was located at home and the provider was located office.    Subjective:   Patient ID:  [name] is a 75 y.o. (DOB 01/31/1946) female.  Chief Complaint:  Chief Complaint  Patient presents with   Follow-up   Depression   Family Problem    Depression        Associated symptoms include decreased concentration and fatigue.  Associated symptoms include no suicidal ideas.  Past medical history includes anxiety.   Anxiety Symptoms include decreased concentration, dizziness, nausea, nervous/anxious behavior and shortness of breath. Patient reports no confusion, palpitations or suicidal ideas.    [name] presents to the office today for follow-up of depression and anxiety and MCI.    at visit January 09, 2019.  She was having problems with restless legs and ropinirole was increased to 3 mg every afternoon.  There were no other med changes.  visit Feb 05, 2019.  She was having mixture of depression and anxiety and chronic insomnia with delayed sleep phase.  For the reasons mentioned at the last visit she was started on risperidone 0.25 mg nightly.  She was maintained on duloxetine 90 mg, clonazepam 1 mg nightly, lamotrigine 150 twice daily, and ropinirole 2 mg 1-1/2 tablets nightly for restless legs.  visit September 2020.  She had stopped the risperidone because she only took it a few days.  She felt nausea and spaced out on it.  No other meds were changed.  Last visit December 2020.  The following was noted: Doing very poorly.  Not getting dressed and not out of the house for 5 days.  Hard to walk with pain.  Falling apart overnight.  Feet hurt at night.  Worries over poor circulation to toes.  Placed on blood thinners.  Continued health problems with COPD.  Energy is low and hard time walking.  Arthritis in back.  House on the market and that's stressful.  Sx largely unchanged but "it's circumstances".  Planning to move to [facility] but ambivalent.  Brother in law lives next door and wants her to stay here.  Also stressed over politics and social things.   Had period of crippling nausea and cut all meds back in 1/2.  Thinks it's mental issue.  Nausea is some better with dose reduction but is more depressed.  Plan: Use pill box to improve compliance. DT nausea  split meds but increase duloxetine 30 mg BID and lamotrigine 75 mg BID. She's been on lower dose  DT nausea but is more depressed.  January 02, 2020 appointment, the following is noted: She got septic with a UTI and then was admitted to [facility] for rehab. Home now.  Getting stronger.  Having to take nausea meds. Also diarrhea.  Would like to be on higher dose of meds but can't tolerate it.  Still on some prednisone.  Pending cardiology with CHF.  Also pulmonology. Sleep is pretty good with meds.   No meds changed.  04/26/20 appt with the following noted:  Missed a lot of med DT nausea.  Back  on duloxetine 60 and lamotrigine 75 mg for a week.  Was more depressed and dizzy off the meds. Nausea comes and goes in waves but could be any time of day but maybe worse in the evening. Eats poorly usually.  If feels bad doesn't want to cook.   Pepcid helps nausea.  Zofran doesn't help. Lately more trouble falling asleep.  Plan: Rec trial mirtazapine 30 mg HS to help with depression and potentially nausea hopefully.  Nausea is interfering with depression treatment.  08/04/20 appt with following noted: Took mirtazapine 30 mg for a week and felt like a zombie with jitteriness outside.  Didn't help nausea much.  Noticing a pattern with nausea related to constipation and bowel problems.  Not ready to take lamotrigine bc it's hard to swallow but felt emotionally better when on it.  Wonders if bipolar bc jumps from topic to topic and talks a lot. Wants to stay on clonazepam to help her sleep.  Aware of memory concerns. Not taking lorazepam but not sure why she isn't.  Thinks it doesn't work as well as clonazepam.  Says she's taking clonazepam at night now. Plan;  Restart lamotrigine at 25 mg and use the small tablets to try to help mood and make it easier to swallow.  Purpose to help mood.  She felt it helped. Will leave duloxetine at 30 low dose. DC mirtazapine DT SE  10/11/2020 appointment with the following noted:  Started and stopped lamotrigine bc not sticking with schedule so just up to lamotrigine 50 mg daily. RLS requires ropinirole.  Sometimes forgets it and has to keep moving.    Periods of being horrible condition with depression but not suicidal.  Hasnt' been that bad lately.  Still grieving [name] and her sister.  Taking a long time.  Watch too much TV.  Sister [name] was her best friend.   Sometimes feels her presence. No energy to move back to [place] but son encourages it.  Procrastinates it bc moving is overwhelming.  Eating OK.  Enjoyed seeing son and gkids.   Nausea better but comes  and goes. Plan: Continue lamotrigine increase to 100 mg as recommended and use the small tablets to try to help mood and make it easier to swallow.  Purpose to help mood.  She felt it helped.  01/03/2021 appointment with following noted: Up some times and down some times.  Manages.  Kind of average. Not as depressed as in the past.  Still getting dizzy easily.  Sleep hours getting worse instead of bettter.  Once of twice weekly stays up all night and then naps.  Doesn't work out well for her body. Still tends to nausea but variable regardless of what she takes.  Most days consistent with lamotrigine.  Easier to swallow the small lamotrigine 25 vs 100 mg tablets. Don't feel the same since the Sepsis. Going to get driver's license renewed tomorrow. RLS managed  with ropinirole.   Sleep pretty good, except irregular hours.Pt reports that mood is Depressed worse at night and describes anxiety as worse and moderate in the same dose for depression but comes and goes.  She wonders if she might be bipolar like her sister.She is also having problems with irritability. Anxiety symptoms include: Excessive Worry, Panic Symptoms,. Pt reports sleep is more regular and consistent with melatonin 10 mg nightly.. Pt reports that appetite is decreased. Pt reports that energy is poor and loss of interest or pleasure in usual activities, poor motivation and withdrawn from usual activities. Concentration is scattered.  Suicidal thoughts:  denied by patient. B In Law next door.   Increased ropinirole helped RLS. Plan: No med changes  04/12/2021 appointment with the following noted: Had to stop driving bc of dizzy.  Eyes flutter.  Balance not always good.  Back pain. Fighting depression.  Fight with [place] over her moving to [place] and doesn't want to help. Still in [place].  Was hoping to move back to [place]. Lonely here and would like support of family. Otherwise feels better than last year.  Needs son to help her move there and  doesn't understand why. Not profoundly depressed but is down.   Lately increased clonazepam to 0.5 mg just at night for anxiety and helps her sleep better and have less anxious thoughts.   Not great sleep.  Eats frozen foods. B in law helps her. Wants to increase duloxetine to 60 mg daily but split dose bc of nausea easily. Plan: Wants to increase duloxetine to 60 mg daily but split dose bc of nausea easily.  06/16/2021 appt noted: Increased duloxetine to 60 mg daily and thinks it helped a little better.  But depression worse with family stress going on.  Son gets mad at her and yells.  Keeping GS from her and this is upsetting.   Can talk with him over the phone.  D-in-law keeping GS from her and it's wrong.  Son blames her but that's not true.   Doesn't think she's strong enough to pack up and move to [place].  Chronic dizziness and risk of falling.  Shuffles too much.   She does get online socially.   Pays attention to the news.   Satisfied with meds. Drinks diet Pepsi and coffee up until dinner.  Her sister had bipolar disorder.  Past Psychiatric Medication Trials: Lithium 300 insomnia, Depakote, olanzapine 2.5,  lamotrigine 300, ropinirole as high as 2 mg 3 times daily for restless legs,  Aricept poorly tolerated,  Abilify side effects,  fluoxetine, sertraline, paroxetine, Lexapro, duloxetine 90,  Mirtazapine 30 zombie Donepezil SE Long psychiatric history.  Sister bipolar. I have never prescribed lorazepam nor Xanax  Review of Systems:  Review of Systems  Constitutional:  Positive for fatigue.  Respiratory:  Positive for shortness of breath.   Cardiovascular:  Negative for palpitations.       Claudication   Gastrointestinal:  Positive for nausea. Negative for anal bleeding.  Musculoskeletal:  Positive for back pain and gait problem.  Neurological:  Positive for dizziness and weakness. Negative for tremors.  Psychiatric/Behavioral:  Positive for decreased concentration,  dysphoric mood and sleep disturbance. Negative for agitation, behavioral problems, confusion, hallucinations, self-injury and suicidal ideas. The patient is nervous/anxious. The patient is not hyperactive.   LEG surgery for claudication Jan 3  Medications: I have reviewed the patient's current medications.  Current Outpatient Medications  Medication Sig Dispense Refill   albuterol (VENTOLIN HFA) 108 (90 Base)  MCG/ACT inhaler Inhale 1-2 puffs into the lungs every 6 (six) hours as needed for wheezing or shortness of breath. 18 g 2   amLODipine (NORVASC) 2.5 MG tablet Take 1 tablet (2.5 mg total) by mouth daily.     clonazePAM (KLONOPIN) 0.5 MG tablet TAKE 1 TABLET(0.5 MG) BY MOUTH TWICE DAILY AS NEEDED FOR ANXIETY (Patient taking differently: 1 tab every night) 60 tablet 2   DULoxetine (CYMBALTA) 30 MG capsule Take 1 capsule (30 mg total) by mouth 2 (two) times daily. (Patient taking differently: Take 30 mg by mouth daily. 1 twice daily) 180 capsule 1   famotidine (PEPCID) 20 MG tablet One at bedtime 30 tablet 11   guaiFENesin-Codeine 200-10 MG/5ML LIQD Take 5 mLs by mouth 4 (four) times daily as needed. 210 mL 0   lamoTRIgine (LAMICTAL) 25 MG tablet Take 2 tablets (50 mg total) by mouth daily. 4 daily. (Patient taking differently: Take 50 mg by mouth daily. 3 tabs daily) 360 tablet 1   linaclotide (LINZESS) 145 MCG CAPS capsule Take 1 capsule (145 mcg total) by mouth daily before breakfast. 90 capsule 3   Melatonin 10 MG TABS Take 20 mg by mouth at bedtime.     rivaroxaban (XARELTO) 20 MG TABS tablet Take 1 tablet (20 mg total) by mouth daily with supper. 30 tablet 11   rOPINIRole (REQUIP) 2 MG tablet Take 1 tablet (2 mg total) by mouth every evening. (Patient taking differently: Take 3 mg by mouth every evening.) 90 tablet 1   No current facility-administered medications for this visit.    Medication Side Effects: None unless nausea  Allergies: No Known Allergies  Past Medical History:   Diagnosis Date   AAA (abdominal aortic aneurysm)    3.1 cm 07/08/18, 3 year follow-up recommended; possible 3 cm AAA by aortogram 09/13/18   Anemia    PMH   Appendicitis with abscess    07/08/18, s/p perc drain; resolved 07/30/18 by CT   Arthritis    Bipolar disorder ([place])    Cerebrovascular disease    intra and extracranial vascular dx per MRI 4/11, neurology rec strict CVRF control   Colonic inertia    Constipation    chronic;severe   Coronary artery disease    Depression    Duodenitis    EKG abnormalities    changes, stress test neg (false EKG changes)   Gastritis    GERD (gastroesophageal reflux disease)    Hypertension    Hypothyroid 01/16/2014   Meningioma ([place]) 10/21/2013   PAD (peripheral artery disease) ([place])    Psoriasis    sees derm   Stroke [facility])    Wears dentures    Wears glasses     Family History  Problem Relation Age of Onset   Breast cancer Mother        metastisis to bones   Diabetes Son    Heart disease Other        grandfather    Alcohol abuse Brother    Heart disease Brother    Heart disease Maternal Aunt    Lung cancer Brother        smoked   Colon cancer Neg Hx    Esophageal cancer Neg Hx    Stomach cancer Neg Hx     Social History   Socioeconomic History   Marital status: Widowed    Spouse name: Not on file   Number of children: 1   Years of education: Not on file   Highest education level:  Not on file  Occupational History   Occupation: retired  Tobacco Use   Smoking status: Every Day    Packs/day: 0.50    Years: 60.00    Pack years: 30.00    Types: Cigarettes    Last attempt to quit: 11/10/2019    Years since quitting: 1.6   Smokeless tobacco: Never  Vaping Use   Vaping Use: Former  Substance and Sexual Activity   Alcohol use: Yes    Alcohol/week: 0.0 standard drinks    Comment: yes on occassion   Drug use: No   Sexual activity: Not Currently  Other Topics Concern   Not on file  Social History Narrative   Brother in  law Mr Rexford Maus (one of my patients)   Lives w/ husband       Social Determinants of Health   Financial Resource Strain: Low Risk    Difficulty of Paying Living Expenses: Not hard at all  Food Insecurity: No Food Insecurity   Worried About Charity fundraiser in the Last Year: Never true   Arboriculturist in the Last Year: Never true  Transportation Needs: No Transportation Needs   Lack of Transportation (Medical): No   Lack of Transportation (Non-Medical): No  Physical Activity: Inactive   Days of Exercise per Week: 0 days   Minutes of Exercise per Session: 0 min  Stress: Not on file  Social Connections: Not on file  Intimate Partner Violence: Not on file    Past Medical History, Surgical history, Social history, and Family history were reviewed and updated as appropriate.   Please see review of systems for further details on the patient's review from today.   Objective:   Physical Exam:  There were no vitals taken for this visit.  Physical Exam Neurological:     Mental Status: She is alert and oriented to person, place, and time.     Cranial Nerves: No dysarthria.  Psychiatric:        Attention and Perception: She is inattentive. She does not perceive auditory hallucinations.        Mood and Affect: Mood is anxious and depressed.        Speech: Speech normal. Speech is not rapid and pressured or slurred.        Behavior: Behavior is not slowed or aggressive. Behavior is cooperative.        Thought Content: Thought content normal. Thought content is not paranoid or delusional. Thought content does not include homicidal or suicidal ideation. Thought content does not include homicidal or suicidal plan.        Cognition and Memory: Memory is impaired. She exhibits impaired recent memory.        Judgment: Judgment is not impulsive or inappropriate.     Comments:   Still depressed, anxious and less irritable.  Insight and judgment are fair Talkative Gets meds mixed up at  times but thinks she's a little better with increased duloxetine.    Lab Review:     Component Value Date/Time   NA 133 (L) 05/11/2021 1811   NA 135 01/02/2020 1602   K 4.0 05/11/2021 1811   CL 97 05/11/2021 1811   CO2 27 05/11/2021 1811   GLUCOSE 92 05/11/2021 1811   BUN 21 05/11/2021 1811   BUN 17 01/02/2020 1602   CREATININE 1.14 05/11/2021 1811   CREATININE 0.77 07/28/2015 1805   CALCIUM 9.8 05/11/2021 1811   PROT 7.6 05/11/2021 1811   PROT 7.9 01/02/2020  1602   ALBUMIN 4.0 05/11/2021 1811   ALBUMIN 4.2 01/02/2020 1602   AST 18 05/11/2021 1811   ALT 11 05/11/2021 1811   ALKPHOS 72 05/11/2021 1811   BILITOT 0.5 05/11/2021 1811   BILITOT 0.2 01/02/2020 1602   GFRNONAA 50 (L) 01/02/2020 1602   GFRAA 58 (L) 01/02/2020 1602       Component Value Date/Time   WBC 7.0 05/11/2021 1811   RBC 4.80 05/11/2021 1811   HGB 14.1 05/11/2021 1811   HGB 11.9 01/02/2020 1602   HCT 42.9 05/11/2021 1811   HCT 36.4 01/02/2020 1602   PLT 223.0 05/11/2021 1811   PLT 370 01/02/2020 1602   MCV 89.3 05/11/2021 1811   MCV 90 01/02/2020 1602   MCH 29.5 01/02/2020 1602   MCH 29.6 11/26/2019 0650   MCHC 32.9 05/11/2021 1811   RDW 14.6 05/11/2021 1811   RDW 14.0 01/02/2020 1602   LYMPHSABS 2.7 01/02/2020 1602   MONOABS 0.4 11/23/2019 1957   EOSABS 0.1 01/02/2020 1602   BASOSABS 0.0 01/02/2020 1602    No results found for: POCLITH, LITHIUM   No results found for: PHENYTOIN, PHENOBARB, VALPROATE, CBMZ   .res Assessment: Plan:    Major depressive disorder, recurrent episode, moderate (HCC)  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  Her chronic depression and anxiety are complicated now by double grief of loss of her sister and now her husband.  She is lonely and mood affected by recent severe mood problems.   Chronic N and dizziness are ongoing problems.  Reduced lamotrigine back to 75 mg daily and use the small tablets to try to help mood and make it easier to swallow.  Purpose to help mood.  She felt it helped.  Cont counseling for grief, depression, anxiety, and health crises.  Supportive therapy dealing with daughter in law and son and not eing able to have contact with her GS from her son.  Disc this at length and talking about limitations. Try to watch more positive things and not just news.  Dizziness is likely DT prior strokes and not meds.  Continue duloxetine to 60 mg daily but split dose bc of nausea easily.  Had to increase ropinirole 3 mg for RLS.  This is better controlled her restless legs.   Stop the coffee after dinner.   Disc SE incl hallucinations and dizziness.   This high dosage appears medically necessary.  Call if this fails and will consider doing something with the clonazepam either increasing it or perhaps switching to another benzodiazepine which might have less cognitive problems.  Such as lorazepam Continue clonazepam at LED at 0.25-0.5 mg BID prn anxiety. We discussed the short-term risks associated with benzodiazepines including sedation and increased fall risk among others.  Discussed long-term side effect risk including dependence, potential withdrawal symptoms, and the potential eventual dose-related risk of dementia.  But recent studies from 2020 dispute this association between benzodiazepines and dementia risk. Newer studies in 2020 do not support an association with dementia.  Emphasized importance and proper nutrition and sleep and protein.  Doesn't eat much meat bc constipation but will eat fish.  Discussed how protein is necessary for the proper response  to  depression medicines.  Can't cook easily.    FU 3-4 mos.  Lynder Parents, MD, DFAPA  Please see After Visit Summary for patient specific instructions.  No future appointments.   No orders of the defined types were placed in this encounter.     -------------------------------

## 2021-06-24 ENCOUNTER — Telehealth: Payer: Self-pay | Admitting: Gastroenterology

## 2021-06-24 NOTE — Telephone Encounter (Signed)
Spoke with patient, she states that she has been having pain for about 5-6 days. Pt reports that pain is in her back above her (R) kidney. She states that it feels like gas pains. Pt reports that she only has pain and no fever. Pt states that she had a bowel movement yesterday and feel like she emptied completely. Pt is still taking Linzess. Pt has not tried any at home conservative measures like Tylenol or heating pad. Advised patient that if she was in severe pain she will need to go the ED for evaluation. Pt states that she will call EMS because she can't drive. Pt had no concerns at the end of the call.

## 2021-06-24 NOTE — Telephone Encounter (Signed)
Patient called states she is in a lot of pain and is seeking advise from the nurse.

## 2021-06-26 ENCOUNTER — Emergency Department (HOSPITAL_COMMUNITY)
Admission: EM | Admit: 2021-06-26 | Discharge: 2021-06-27 | Disposition: A | Discharge status: Home / Self Care | Payer: Medicare Other | Attending: Emergency Medicine | Admitting: Emergency Medicine

## 2021-06-26 ENCOUNTER — Other Ambulatory Visit: Payer: Self-pay

## 2021-06-26 DIAGNOSIS — I10 Essential (primary) hypertension: Secondary | ICD-10-CM | POA: Diagnosis not present

## 2021-06-26 DIAGNOSIS — Z79899 Other long term (current) drug therapy: Secondary | ICD-10-CM | POA: Insufficient documentation

## 2021-06-26 DIAGNOSIS — I2510 Atherosclerotic heart disease of native coronary artery without angina pectoris: Secondary | ICD-10-CM | POA: Insufficient documentation

## 2021-06-26 DIAGNOSIS — M545 Low back pain, unspecified: Secondary | ICD-10-CM | POA: Insufficient documentation

## 2021-06-26 DIAGNOSIS — R109 Unspecified abdominal pain: Secondary | ICD-10-CM | POA: Diagnosis not present

## 2021-06-26 DIAGNOSIS — E039 Hypothyroidism, unspecified: Secondary | ICD-10-CM | POA: Diagnosis not present

## 2021-06-26 DIAGNOSIS — N281 Cyst of kidney, acquired: Secondary | ICD-10-CM | POA: Diagnosis not present

## 2021-06-26 DIAGNOSIS — R110 Nausea: Secondary | ICD-10-CM | POA: Diagnosis not present

## 2021-06-26 DIAGNOSIS — Z7901 Long term (current) use of anticoagulants: Secondary | ICD-10-CM | POA: Insufficient documentation

## 2021-06-26 DIAGNOSIS — F17210 Nicotine dependence, cigarettes, uncomplicated: Secondary | ICD-10-CM | POA: Insufficient documentation

## 2021-06-26 NOTE — ED Triage Notes (Signed)
PT c/o L flank x 2 weeks that has been getting progressively worse.  Also c/o frequent and painful urination.

## 2021-06-26 NOTE — ED Provider Notes (Signed)
Emergency Medicine Provider Triage Evaluation Note  [name] , a 75 y.o. female  was evaluated in triage.  Pt complains of left flank pain, dysuria x 1 week. Hx of UTIs.  Denies fever, chills, HA, neck pain, CP, SOB.  NO know sick contacts.  Pt reports she came in tonight because of the pain.  Refused IV and pain control for EMS.  Review of Systems  Positive: Flank pain, dysuria, foul smelling odor Negative: Fever, chills, vomiting, diarrhea  Physical Exam  BP (!) 178/97   Pulse 71   Temp 97.7 F (36.5 C) (Oral)   Resp 16   SpO2 97%  Gen:   Awake, no distress   Resp:  Normal effort  MSK:   Moves extremities without difficulty  Other:  L CVA tenderness  Medical Decision Making  Medically screening exam initiated at 11:15 PM.  Appropriate orders placed.  TOBI GROESBECK was informed that the remainder of the evaluation will be completed by another provider, this initial triage assessment does not replace that evaluation, and the importance of remaining in the ED until their evaluation is complete.  Flank pain, Hx of UTI.  NO vomiting.  Labs and imaging pending.    Rosalia Mcavoy, Gwenlyn Perking 06/26/21 2321    Maudie Flakes, MD 06/27/21 [phone]

## 2021-06-27 ENCOUNTER — Encounter (HOSPITAL_COMMUNITY): Payer: Self-pay | Admitting: *Deleted

## 2021-06-27 ENCOUNTER — Telehealth: Payer: Self-pay | Admitting: Family Medicine

## 2021-06-27 ENCOUNTER — Emergency Department (HOSPITAL_COMMUNITY): Payer: Medicare Other

## 2021-06-27 DIAGNOSIS — M545 Low back pain, unspecified: Secondary | ICD-10-CM | POA: Diagnosis not present

## 2021-06-27 DIAGNOSIS — N281 Cyst of kidney, acquired: Secondary | ICD-10-CM | POA: Diagnosis not present

## 2021-06-27 LAB — URINALYSIS, ROUTINE W REFLEX MICROSCOPIC
Bacteria, UA: NONE SEEN
Bilirubin Urine: NEGATIVE
Glucose, UA: NEGATIVE mg/dL
Hgb urine dipstick: NEGATIVE
Ketones, ur: NEGATIVE mg/dL
Nitrite: NEGATIVE
Protein, ur: NEGATIVE mg/dL
Specific Gravity, Urine: 1.012 (ref 1.005–1.030)
pH: 6 (ref 5.0–8.0)

## 2021-06-27 LAB — COMPREHENSIVE METABOLIC PANEL
ALT: 16 U/L (ref 0–44)
AST: 20 U/L (ref 15–41)
Albumin: 3.7 g/dL (ref 3.5–5.0)
Alkaline Phosphatase: 67 U/L (ref 38–126)
Anion gap: 7 (ref 5–15)
BUN: 22 mg/dL (ref 8–23)
CO2: 28 mmol/L (ref 22–32)
Calcium: 9.7 mg/dL (ref 8.9–10.3)
Chloride: 100 mmol/L (ref 98–111)
Creatinine, Ser: 1.05 mg/dL — ABNORMAL HIGH (ref 0.44–1.00)
GFR, Estimated: 55 mL/min — ABNORMAL LOW (ref >60)
Glucose, Bld: 125 mg/dL — ABNORMAL HIGH (ref 70–99)
Potassium: 4.4 mmol/L (ref 3.5–5.1)
Sodium: 135 mmol/L (ref 135–145)
Total Bilirubin: 0.5 mg/dL (ref 0.3–1.2)
Total Protein: 7.8 g/dL (ref 6.5–8.1)

## 2021-06-27 LAB — CBC WITH DIFFERENTIAL/PLATELET
Abs Immature Granulocytes: 0.03 10*3/uL (ref 0.00–0.07)
Basophils Absolute: 0 10*3/uL (ref 0.0–0.1)
Basophils Relative: 1 %
Eosinophils Absolute: 0.1 10*3/uL (ref 0.0–0.5)
Eosinophils Relative: 2 %
HCT: 44.1 % (ref 36.0–46.0)
Hemoglobin: 14 g/dL (ref 12.0–15.0)
Immature Granulocytes: 1 %
Lymphocytes Relative: 39 %
Lymphs Abs: 2.6 10*3/uL (ref 0.7–4.0)
MCH: 28.8 pg (ref 26.0–34.0)
MCHC: 31.7 g/dL (ref 30.0–36.0)
MCV: 90.7 fL (ref 80.0–100.0)
Monocytes Absolute: 0.5 10*3/uL (ref 0.1–1.0)
Monocytes Relative: 8 %
Neutro Abs: 3.3 10*3/uL (ref 1.7–7.7)
Neutrophils Relative %: 49 %
Platelets: 212 10*3/uL (ref 150–400)
RBC: 4.86 MIL/uL (ref 3.87–5.11)
RDW: 14 % (ref 11.5–15.5)
WBC: 6.6 10*3/uL (ref 4.0–10.5)
nRBC: 0 % (ref 0.0–0.2)

## 2021-06-27 LAB — LIPASE, BLOOD: Lipase: 27 U/L (ref 11–51)

## 2021-06-27 MED ORDER — TRAMADOL HCL 50 MG PO TABS
50.0000 mg | ORAL_TABLET | Freq: Once | ORAL | Status: AC
  Administered 2021-06-27: 50 mg via ORAL
  Filled 2021-06-27: qty 1

## 2021-06-27 NOTE — Discharge Instructions (Addendum)
The pain in your back, is located at the left sacroiliac joint.  Therefore this is most likely musculoskeletal pain.  Treatments for this include heat and Tylenol.  Try using a heating pad, 3-4 times a day and take Tylenol every 4 hours.  You may need further testing and treatment which your PCP can arrange.  Testing that may be beneficial includes an MRI of the pelvis to evaluate for sacral insufficiency fracture.  Treatments can include injections in the affected joint, to decrease the pain and not disturb your bowel function.  Call your PCP for follow-up care as soon as possible.

## 2021-06-27 NOTE — Telephone Encounter (Signed)
See my last lab note.  Borderline low sodium which was repeated on labs in the ER.  No additional recommendations.

## 2021-06-27 NOTE — ED Provider Notes (Signed)
[facility] EMERGENCY DEPARTMENT Provider Note   CSN: [id number] Arrival date & time: 06/26/21  2332     History Chief Complaint  Patient presents with   Flank Pain    [name] is a 75 y.o. female.  HPI She presents for evaluation of left flank pain, with frequent and painful urination, for 2 weeks.  She has a history of constipation, for which she uses Linzess.  No known trauma.  She denies fever, chills, nausea, vomiting.  No prior similar problems.  There are no other known active modifying factors    Past Medical History:  Diagnosis Date   AAA (abdominal aortic aneurysm)    3.1 cm 07/08/18, 3 year follow-up recommended; possible 3 cm AAA by aortogram 09/13/18   Anemia    PMH   Appendicitis with abscess    07/08/18, s/p perc drain; resolved 07/30/18 by CT   Arthritis    Bipolar disorder ([place])    Cerebrovascular disease    intra and extracranial vascular dx per MRI 4/11, neurology rec strict CVRF control   Colonic inertia    Constipation    chronic;severe   Coronary artery disease    Depression    Duodenitis    EKG abnormalities    changes, stress test neg (false EKG changes)   Gastritis    GERD (gastroesophageal reflux disease)    Hypertension    Hypothyroid 01/16/2014   Meningioma ([place]) 10/21/2013   PAD (peripheral artery disease) ([place])    Psoriasis    sees derm   Stroke [facility])    Wears dentures    Wears glasses     Patient Active Problem List   Diagnosis Date Noted   Centrilobular emphysema ([place]) 10/19/2020   Chronic respiratory failure with hypoxia ([place]) 12/26/2019   Shortness of breath 12/26/2019   Bilateral pulmonary infiltrates on CXR    Complicated UTI (urinary tract infection) 11/11/2019   AKI (acute kidney injury) ([place]) 11/11/2019   Hyponatremia 11/11/2019   Elevated LFTs 11/11/2019   Sepsis ([place]) 11/11/2019   Wound infection 11/07/2018   Postoperative pain    Sleep disturbance    Tobacco abuse    Acute blood loss  anemia    Hypoalbuminemia due to protein-calorie malnutrition ([place])    Debility 10/24/2018   Pre-operative cardiovascular examination 09/26/2018   HLD (hyperlipidemia) 09/26/2018   GAD (generalized anxiety disorder) 08/07/2018   Major depressive disorder, single episode 08/07/2018   Appendiceal abscess 07/08/2018   PVD (peripheral vascular disease) ([place]) 03/15/2018   Atherosclerosis of native artery of left lower extremity with intermittent claudication ([place]) 03/15/2018   Chronic left-sided low back pain with left-sided sciatica 12/25/2017   Facial droop    History of CVA (cerebrovascular accident) 10/23/2017   Critical lower limb ischemia ([place]) 11/08/2016   Smoker 11/08/2016   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 01/16/2014   Mild cognitive impairment 01/16/2014   Meningioma ([place]) 10/21/2013   Chest pain 10/20/2013   Medicare annual wellness visit, subsequent 06/16/2013   Dizziness and giddiness 06/16/2013   RLS (restless legs syndrome) 04/29/2012   Abdominal pain 12/27/2010   DEGENERATIVE JOINT DISEASE 09/23/2010   CAROTID ARTERY DISEASE 08/15/2010   CHEST PAIN 08/15/2010   HEADACHE 12/02/2009   BACK PAIN 11/22/2009   CONSTIPATION, CHRONIC 01/29/2008   NAUSEA 01/28/2008   DEPRESSION 03/08/2007   HTN (hypertension) 03/08/2007    Past Surgical History:  Procedure Laterality Date   ABDOMINAL AORTOGRAM W/LOWER  EXTREMITY N/A 09/13/2018   Procedure: ABDOMINAL AORTOGRAM W/LOWER EXTREMITY;  Surgeon: Elam Dutch, MD;  Location: [place] CV [facility];  Service: Cardiovascular;  Laterality: N/A;   arthroscopy  04/2010   Right knee   CATARACT EXTRACTION W/ INTRAOCULAR LENS  IMPLANT, BILATERAL     COLONOSCOPY     FEMORAL-POPLITEAL BYPASS GRAFT  10/22/2018   FEMORAL-POPLITEAL BYPASS GRAFT Left 10/22/2018   Procedure: LEFT FEMORAL TO BELOW THE KNEE POPLITEAL ARTERY BYPASS GRAFT;  Surgeon: Elam Dutch, MD;  Location:  [place];  Service: Vascular;  Laterality: Left;   I & D EXTREMITY Left 11/08/2018   Procedure: IRRIGATION AND DEBRIDEMENT EXTREMITY Left Leg;  Surgeon: Elam Dutch, MD;  Location: [place];  Service: Vascular;  Laterality: Left;   IR RADIOLOGIST EVAL & MGMT  07/30/2018   MULTIPLE TOOTH EXTRACTIONS     TUBAL LIGATION       OB History   No obstetric history on file.     Family History  Problem Relation Age of Onset   Breast cancer Mother        metastisis to bones   Diabetes Son    Heart disease Other        grandfather    Alcohol abuse Brother    Heart disease Brother    Heart disease Maternal Aunt    Lung cancer Brother        smoked   Colon cancer Neg Hx    Esophageal cancer Neg Hx    Stomach cancer Neg Hx     Social History   Tobacco Use   Smoking status: Every Day    Packs/day: 0.50    Years: 60.00    Pack years: 30.00    Types: Cigarettes    Last attempt to quit: 11/10/2019    Years since quitting: 1.6   Smokeless tobacco: Never  Vaping Use   Vaping Use: Former  Substance Use Topics   Alcohol use: Yes    Alcohol/week: 0.0 standard drinks    Comment: yes on occassion   Drug use: No    Home Medications Prior to Admission medications   Medication Sig Start Date End Date Taking? Authorizing Provider  albuterol (VENTOLIN HFA) 108 (90 Base) MCG/ACT inhaler Inhale 1-2 puffs into the lungs every 6 (six) hours as needed for wheezing or shortness of breath. 10/19/20   Martyn Ehrich, NP  amLODipine (NORVASC) 2.5 MG tablet Take 1 tablet (2.5 mg total) by mouth daily. 11/28/19   Gherghe, Vella Redhead, MD  clonazePAM (KLONOPIN) 0.5 MG tablet TAKE 1 TABLET(0.5 MG) BY MOUTH TWICE DAILY AS NEEDED FOR ANXIETY Patient taking differently: 1 tab every night 04/12/21   Cottle, Billey Co., MD  DULoxetine (CYMBALTA) 30 MG capsule Take 1 capsule (30 mg total) by mouth 2 (two) times daily. Patient taking differently: Take 30 mg by mouth daily. 1 twice daily 04/12/21   Cottle, Billey Co., MD  famotidine (PEPCID) 20 MG tablet One at bedtime 02/14/16   Tanda Rockers, MD  guaiFENesin-Codeine 200-10 MG/5ML LIQD Take 5 mLs by mouth 4 (four) times daily as needed. 01/17/21   Laurin Coder, MD  lamoTRIgine (LAMICTAL) 25 MG tablet Take 2 tablets (50 mg total) by mouth daily. 4 daily. Patient taking differently: Take 50 mg by mouth daily. 3 tabs daily 01/03/21   Cottle, Billey Co., MD  linaclotide [facility]) 145 MCG CAPS capsule Take 1 capsule (145 mcg total) by mouth daily before breakfast. 02/10/21  Yetta Flock, MD  Melatonin 10 MG TABS Take 20 mg by mouth at bedtime.    [provider]  rivaroxaban (XARELTO) 20 MG TABS tablet Take 1 tablet (20 mg total) by mouth daily with supper. 02/23/20   [name], MD  rOPINIRole (REQUIP) 2 MG tablet Take 1 tablet (2 mg total) by mouth every evening. Patient taking differently: Take 3 mg by mouth every evening. 04/26/21   Cottle, Billey Co., MD    Allergies    Patient has no known allergies.  Review of Systems   Review of Systems  All other systems reviewed and are negative.  Physical Exam Updated Vital Signs BP (!) 181/95   Pulse 67   Temp 97.7 F (36.5 C) (Oral)   Resp 20   SpO2 95%   Physical Exam Vitals and nursing note reviewed.  Constitutional:      Appearance: She is well-developed. She is not ill-appearing.  HENT:     Head: Normocephalic and atraumatic.     Right Ear: External ear normal.     Left Ear: External ear normal.  Eyes:     Conjunctiva/sclera: Conjunctivae normal.     Pupils: Pupils are equal, round, and reactive to light.  Neck:     Trachea: Phonation normal.  Cardiovascular:     Rate and Rhythm: Normal rate.  Pulmonary:     Effort: Pulmonary effort is normal.     Breath sounds: Normal breath sounds.  Abdominal:     General: There is no distension.     Tenderness: There is no abdominal tenderness.  Musculoskeletal:        General: Normal range of motion.     Cervical  back: Normal range of motion and neck supple.     Comments: Mild tenderness, left SI region, without deformity.  No abnormal motion of the thoracic or lumbar spines.  Negative straight leg raising bilaterally.  Skin:    General: Skin is warm and dry.  Neurological:     Mental Status: She is alert and oriented to person, place, and time.     Cranial Nerves: No cranial nerve deficit.     Sensory: No sensory deficit.     Motor: No abnormal muscle tone.     Coordination: Coordination normal.  Psychiatric:        Mood and Affect: Mood normal.        Behavior: Behavior normal.        Thought Content: Thought content normal.        Judgment: Judgment normal.    ED Results / Procedures / Treatments   Labs (all labs ordered are listed, but only abnormal results are displayed) Labs Reviewed  COMPREHENSIVE METABOLIC PANEL - Abnormal; Notable for the following components:      Result Value   Glucose, Bld 125 (*)    Creatinine, Ser 1.05 (*)    GFR, Estimated 55 (*)    All other components within normal limits  URINALYSIS, ROUTINE W REFLEX MICROSCOPIC - Abnormal; Notable for the following components:   APPearance HAZY (*)    Leukocytes,Ua SMALL (*)    All other components within normal limits  CBC WITH DIFFERENTIAL/PLATELET  LIPASE, BLOOD    EKG None  Radiology CT Renal Stone Study  Result Date: 06/27/2021 CLINICAL DATA:  Left-sided flank pain increasing over the past 2 weeks EXAM: CT ABDOMEN AND PELVIS WITHOUT CONTRAST TECHNIQUE: Multidetector CT imaging of the abdomen and pelvis was performed following the standard protocol  without IV contrast. COMPARISON:  11/11/2019 FINDINGS: Lower chest: Mild scarring is noted in the bases bilaterally. Subpleural fibrosis is seen as well. Hepatobiliary: No focal liver abnormality is seen. No gallstones, gallbladder wall thickening, or biliary dilatation. Pancreas: Unremarkable. No pancreatic ductal dilatation or surrounding inflammatory changes.  Spleen: Normal in size without focal abnormality. Adrenals/Urinary Tract: Adrenal glands are within normal limits. Kidneys are well visualized bilaterally. Lower pole renal cyst is noted on the left stable from the prior exam. No renal calculi or obstructive changes are seen. The bladder is partially distended. Stomach/Bowel: The appendix is not well visualized. No inflammatory changes to suggest appendicitis are noted. No obstructive or inflammatory changes of the colon or small bowel are seen. The stomach is within normal limits. Vascular/Lymphatic: Atherosclerotic calcifications are noted. Dilatation of the infrarenal aorta is noted to 3 cm. No sizable lymphadenopathy is seen. Reproductive: Uterus and bilateral adnexa are unremarkable. Other: No abdominal wall hernia or abnormality. No abdominopelvic ascites. Musculoskeletal: Degenerative changes of lumbar spine are noted. IMPRESSION: No renal calculi or obstructive changes are noted. Stable small left lower pole renal cyst. 3 cm infrarenal abdominal aortic aneurysm. Recommend follow-up every 3 years. Reference: J Am Coll Radiol 2992;42:[id number]. Electronically Signed   By: Inez Catalina M.D.   On: 06/27/2021 01:20    Procedures Procedures   Medications Ordered in ED Medications  traMADol (ULTRAM) tablet 50 mg (50 mg Oral Given 06/27/21 1018)    ED Course  I have reviewed the triage vital signs and the nursing notes.  Pertinent labs & imaging results that were available during my care of the patient were reviewed by me and considered in my medical decision making (see chart for details).    MDM Rules/Calculators/A&P                            Patient Vitals for the past 24 hrs:  BP Temp Temp src Pulse Resp SpO2  06/27/21 1000 (!) 181/95 -- -- 67 20 95 %  06/27/21 0936 (!) 166/101 -- -- 69 20 100 %  06/27/21 0731 (!) 186/103 -- -- 66 14 95 %  06/27/21 0348 (!) 197/97 -- -- 68 17 100 %  06/26/21 2314 (!) 178/97 97.7 F (36.5 C) Oral 71 16  97 %    10:43 AM Reevaluation with update and discussion. After initial assessment and treatment, an updated evaluation reveals no change in status, findings discussed and questions answered. Daleen Bo   Medical Decision Making:  This patient is presenting for evaluation of low back pain, which does require a range of treatment options, and is a complaint that involves a moderate risk of morbidity and mortality. The differential diagnoses include fracture, contusion, urinary tract disorder. I decided to review old records, and in summary elderly female presenting with atraumatic low back pain, without specific trauma.  I did not require additional historical information from anyone.  Clinical Laboratory Tests Ordered, included CBC, Metabolic panel, and Urinalysis. Review indicates normal except glucose high, creatinine. Radiologic Tests Ordered, included CT renal.  I independently Visualized: Radiographic images, which show no acute abnormalities.  Bony windows, do not indicate fracture of the pelvic region.   Critical Interventions-clinical evaluation, laboratory testing, radiography, observation and reassessment  After These Interventions, the Patient was reevaluated and was found stable for discharge.  Patient's pain is in the left SI region.  CT imaging does not indicate fracture however sacral insufficiency fracture could be missed on  this modality.  Patient's pain is mild and was treated with tramadol.  Doubt UTI  CRITICAL CARE-no Performed by: Daleen Bo  Nursing Notes Reviewed/ Care Coordinated Applicable Imaging Reviewed Interpretation of Laboratory Data incorporated into ED treatment  The patient appears reasonably screened and/or stabilized for discharge and I doubt any other medical condition or other [facility] requiring further screening, evaluation, or treatment in the ED at this time prior to discharge.  Plan: Home Medications-continue current intake Tylenol for pain;  Home Treatments-heat to affected area; return here if the recommended treatment, does not improve the symptoms; Recommended follow up-PCP,.     Final Clinical Impression(s) / ED Diagnoses Final diagnoses:  Left-sided low back pain without sciatica, unspecified chronicity    Rx / DC Orders ED Discharge Orders     None        Daleen Bo, MD 06/27/21 1043

## 2021-06-27 NOTE — Telephone Encounter (Signed)
..  Caller name:  [name]  On DPR? :yes/no: Yes  Call back number:[phone]  Provider they see: Carlota Raspberry   Reason for call: Patient is at the ED with pain in her back.  She states that Dr. Carlota Raspberry had wanted her to have some blood work before but she doesn't remember what it was.  Patient wants to know if you can order the blood work while she is in the ED?  Please advise

## 2021-06-27 NOTE — Telephone Encounter (Signed)
Called and spoke with patient about her concerns of her sodium level. Patient understood the sodium lab was the only thing that needed to be repeated. No further concerns there. Pt stated that she is not driving so she will get in touch with her brother to bring her in office for a hospital follow up.

## 2021-06-27 NOTE — Telephone Encounter (Signed)
Patient is in the ER for back pain and has stated that there were some labs that you discussed with her to have done but does not remember what they were and wanted to know if she could have them done there and if so what were the labs? Please advise

## 2021-06-28 ENCOUNTER — Encounter: Payer: Self-pay | Admitting: Family Medicine

## 2021-06-29 MED ORDER — LINACLOTIDE 145 MCG PO CAPS: 145.0000 ug | ORAL_CAPSULE | Freq: Two times a day (BID) | ORAL | 1 refills | Status: DC

## 2021-06-29 NOTE — Telephone Encounter (Signed)
Returned patients call about labs and left a message to call office back.

## 2021-06-29 NOTE — Telephone Encounter (Signed)
Thanks for the follow up. Sorry to hear this. Reviewed CT scan and labs in the ED which did not show anything concerning.  She can try increasing Linzess to 122mcg BID to see if that helps. She can also take Miralax PRN on top of that or daily if needed as well. Maybe good to book her a follow up with me as well to discuss long term regimen if this has been more bothersome to her recently as well.

## 2021-06-29 NOTE — Telephone Encounter (Signed)
Labs noted.  Urinalysis checked for her back pain.  Did not appear to be urinary tract infection.  Leukocytes on urinalysis but  did not appear to have significant white blood cells when looked at microscopic test, which is a better indicator to determine infection.If any new urinary symptoms, recommend evaluation through our office, urgent care or ER if needed.

## 2021-06-29 NOTE — Telephone Encounter (Signed)
Patient called back wanting to ask Dr. Carlota Raspberry if he would review her lab tests that were done at the emergency room.  Patient wants to talk to Dr. Carlota Raspberry ASAP.  Please advise

## 2021-06-29 NOTE — Telephone Encounter (Signed)
Spoke with patient, she states that she has been having issues with constipation. Pt reports that her last BM was 3 days ago but states that she had a small BM last night that was "mushy". Pt reports that when she goes to have a BM she has to strain a lot which causes her sciatica pain to worsen. Pt states that she takes Miralax about 3 times a week (PRN) along with a [employer] laxative tablet. Pt reports that the Miralax helps but some medications cause her to be nauseated. Pt wants to know if she can have prescribed laxative. I asked her if she tried taking the Miralax daily in addition to the [place], she has not tried this. Pt would like to know what you recommend. Advised that you may increase her Linzess, may have her add Miralax daily, or may want her to complete a bowel purge. Pt prefers not to do bowel purge. Patient spent several minutes complaining of 11 hour wait at [facility] ED on Sunday. Please advise, thanks.

## 2021-06-29 NOTE — Telephone Encounter (Signed)
Spoke with patient in regards to recommendations. Pt will increase Linzess 145 mcg BID in addition to taking Miralax. Advised patient to make sure she is drinking plenty of water. Patient has been scheduled for a follow up on Friday, 07/08/21 at 1:20 PM. Pt is aware that I will send in new RX to reflect dose change of Linzess. Pt confirmed pharmacy on file. Pt verbalized understanding and had no concerns at the end of the call.

## 2021-06-29 NOTE — Telephone Encounter (Signed)
Patient called requesting to speak with a nurse regarding further treatment said she is having crazy BM's and was just seen at ED.

## 2021-06-29 NOTE — Addendum Note (Signed)
Addended by: Yevette Edwards on: 06/29/2021 12:45 PM   Modules accepted: Orders

## 2021-07-01 ENCOUNTER — Telehealth: Payer: Self-pay

## 2021-07-01 MED ORDER — LINACLOTIDE 145 MCG PO CAPS: 145.0000 ug | ORAL_CAPSULE | Freq: Two times a day (BID) | ORAL | 3 refills | Status: DC

## 2021-07-01 NOTE — Telephone Encounter (Signed)
Medicare PA approved over the phone for BID dosing.  Case #[id number]  effective 06-01-21 through 07-01-22.  Phone: [phone].  Member id: [id number]

## 2021-07-07 ENCOUNTER — Telehealth: Payer: Self-pay

## 2021-07-07 NOTE — Telephone Encounter (Signed)
Patient called to see if she can change her appointment tomorrow with Dr. Havery Moros to a virtual visit.  Patient is not feeling well and she is having problems with her knees. She went to the ER  on 10-16 for abd and back pain.  Medication, allergies  and medical Hx reviewed and updated. Chief complaint: chronic constipation and gas pains. She is Taking Linzess 145 mcg BID along with dulcolax and Senakot extra strength occasionally as needed. She can usually have a BM daily. Patient understands to login to MyChart tomorrow around 1:10pm to join her virtual visit.

## 2021-07-08 ENCOUNTER — Telehealth (INDEPENDENT_AMBULATORY_CARE_PROVIDER_SITE_OTHER): Payer: Medicare Other | Admitting: Gastroenterology

## 2021-07-08 DIAGNOSIS — Z1211 Encounter for screening for malignant neoplasm of colon: Secondary | ICD-10-CM | POA: Diagnosis not present

## 2021-07-08 DIAGNOSIS — R110 Nausea: Secondary | ICD-10-CM | POA: Diagnosis not present

## 2021-07-08 DIAGNOSIS — K5909 Other constipation: Secondary | ICD-10-CM | POA: Diagnosis not present

## 2021-07-08 NOTE — Patient Instructions (Signed)
If you are age 77 or older, your body mass index should be between 23-30. Your There is no height or weight on file to calculate BMI. If this is out of the aforementioned range listed, please consider follow up with your Primary Care Provider.  If you are age 25 or younger, your body mass index should be between 19-25. Your There is no height or weight on file to calculate BMI. If this is out of the aformentioned range listed, please consider follow up with your Primary Care Provider.   ________________________________________________________  The [place] [facility] providers would like to encourage you to use [facility] to communicate with providers for non-urgent requests or questions.  Due to long hold times on the telephone, sending your provider a message by [facility] may be a faster and more efficient way to get a response.  Please allow 48 business hours for a response.  Please remember that this is for non-urgent requests.  _______________________________________________________  Continue Linzess 145 mcg twice daily.  Bowel purge:  Mix a 238 g bottle of Miralax with a 64 ounce bottle of room temperature Gatorade (or an alternative of you choice). Place in the fridge until chilled.   Drink the entire mixture over the course of the day.  Follow up as needed.  Thank you for entrusting me with your care and for choosing [facility], Dr. Bement Cellar

## 2021-07-08 NOTE — Progress Notes (Signed)
Virtual Visit via Video Note  I connected with [name] on 07/08/21 at  1:20 PM EDT by a video enabled telemedicine application and verified that I am speaking with the correct person using two identifiers.  Location: Patient: home Provider: office   HPI :  75 year old female here for a follow-up visit via telemedicine for chronic constipation and nausea. Recall that she had been dealing with chronic nausea and upset stomach for years.  Zofran in the past did not help.  She is been on Pepcid for some time now which essentially has resolved her symptoms and feeling quite well on it.  She has had an upper endoscopy in the past for this which did not show any concerning findings, and biopsies negative for H. pylori.  As her last visit she is continue to take this feeling very well.  Her main issue over time is been longstanding constipation.  She has been on multiple regimens in the past.  More recently has been on Linzess 145 mcg.  This has worked really well for her over recent visits.  Fortunately she had some worsening and called into the office to have last seen her.  We decided to increase her Linzess to twice daily and she states certainly the higher dose has been helping.  She has been taking MiraLAX or Dulcolax as needed to help on top of this but generally she has noticed improvement.  She still feels like she is "backed up however in my feel better with purging her bowel.  She does have some gas pains and bloating that bothers her.  She inquires about dietary changes.  She denies any other new medications that could be contributing to this.  She was seen in the ED for back pain, had a renal stone study which showed no renal calculi.  Her colon appeared normal.  Also recall that she had a failed attempt at colonoscopy in May 2009. A barium enema at that time showed that barium could not be refluxed beyond the sigmoid colon. A virtual colonoscopy in February 2011 showed a severely  tortuous colon.  She had a surgical consultation by Dr. Zella Richer in 2011 for consideration of partial colectomy, as well as by a surgeon in [place] at that time, but she did not have this done.   She has not had colon cancer screening since that time.  She does not wish to pursue colon cancer screening given her age and other comorbidities.  Past Medical History:  Diagnosis Date   AAA (abdominal aortic aneurysm)    3.1 cm 07/08/18, 3 year follow-up recommended; possible 3 cm AAA by aortogram 09/13/18   Anemia    PMH   Appendicitis with abscess    07/08/18, s/p perc drain; resolved 07/30/18 by CT   Arthritis    Bipolar disorder ([place])    Cerebrovascular disease    intra and extracranial vascular dx per MRI 4/11, neurology rec strict CVRF control   Colonic inertia    Constipation    chronic;severe   Coronary artery disease    Depression    Duodenitis    EKG abnormalities    changes, stress test neg (false EKG changes)   Gastritis    GERD (gastroesophageal reflux disease)    Hypertension    Hypothyroid 01/16/2014   Meningioma ([place]) 10/21/2013   PAD (peripheral artery disease) (HCC)    Psoriasis    sees derm   Stroke [facility])    Wears dentures    Wears  glasses      Past Surgical History:  Procedure Laterality Date   ABDOMINAL AORTOGRAM W/LOWER EXTREMITY N/A 09/13/2018   Procedure: ABDOMINAL AORTOGRAM W/LOWER EXTREMITY;  Surgeon: Elam Dutch, MD;  Location: [place] [facility];  Service: Cardiovascular;  Laterality: N/A;   arthroscopy  04/2010   Right knee   CATARACT EXTRACTION W/ INTRAOCULAR LENS  IMPLANT, BILATERAL     COLONOSCOPY     FEMORAL-POPLITEAL BYPASS GRAFT  10/22/2018   FEMORAL-POPLITEAL BYPASS GRAFT Left 10/22/2018   Procedure: LEFT FEMORAL TO BELOW THE KNEE POPLITEAL ARTERY BYPASS GRAFT;  Surgeon: Elam Dutch, MD;  Location: [facility] OR;  Service: Vascular;  Laterality: Left;   I & D EXTREMITY Left 11/08/2018   Procedure: IRRIGATION AND DEBRIDEMENT EXTREMITY Left Leg;   Surgeon: Elam Dutch, MD;  Location: [place];  Service: Vascular;  Laterality: Left;   IR RADIOLOGIST EVAL & MGMT  07/30/2018   MULTIPLE TOOTH EXTRACTIONS     TUBAL LIGATION     Family History  Problem Relation Age of Onset   Breast cancer Mother        metastisis to bones   Cancer Sister 70       spinal   Alcohol abuse Brother    Heart disease Brother    Lung cancer Brother        smoked   Diabetes Son    Heart disease Maternal Aunt    Heart disease Other        grandfather    Colon cancer Neg Hx    Esophageal cancer Neg Hx    Stomach cancer Neg Hx    Social History   Tobacco Use   Smoking status: Every Day    Packs/day: 0.50    Years: 60.00    Pack years: 30.00    Types: Cigarettes    Last attempt to quit: 11/10/2019    Years since quitting: 1.6   Smokeless tobacco: Never  Vaping Use   Vaping Use: Former  Substance Use Topics   Alcohol use: Yes    Alcohol/week: 0.0 standard drinks    Comment: yes on occassion   Drug use: No   Current Outpatient Medications  Medication Sig Dispense Refill   albuterol (VENTOLIN HFA) 108 (90 Base) MCG/ACT inhaler Inhale 1-2 puffs into the lungs every 6 (six) hours as needed for wheezing or shortness of breath. 18 g 2   clonazePAM (KLONOPIN) 0.5 MG tablet TAKE 1 TABLET(0.5 MG) BY MOUTH TWICE DAILY AS NEEDED FOR ANXIETY (Patient taking differently: 1 tab every night) 60 tablet 2   DULoxetine (CYMBALTA) 30 MG capsule Take 1 capsule (30 mg total) by mouth 2 (two) times daily. (Patient taking differently: Take 30 mg by mouth daily. 1 twice daily) 180 capsule 1   famotidine (PEPCID) 20 MG tablet One at bedtime 30 tablet 11   lamoTRIgine (LAMICTAL) 25 MG tablet Take 2 tablets (50 mg total) by mouth daily. 4 daily. (Patient taking differently: Take 50 mg by mouth daily. 3 tabs daily) 360 tablet 1   linaclotide (LINZESS) 145 MCG CAPS capsule Take 1 capsule (145 mcg total) by mouth 2 (two) times daily. 60 capsule 3   Magnesium Hydroxide  (DULCOLAX PO) Take by mouth as needed.     Melatonin 10 MG TABS Take 20 mg by mouth at bedtime.     rivaroxaban (XARELTO) 20 MG TABS tablet Take 1 tablet (20 mg total) by mouth daily with supper. 30 tablet 11   rOPINIRole (REQUIP) 2  MG tablet Take 1 tablet (2 mg total) by mouth every evening. (Patient taking differently: Take 3 mg by mouth every evening.) 90 tablet 1   Senna EXTR Take by mouth as needed.     No current facility-administered medications for this visit.   No Known Allergies   Review of Systems: All systems reviewed and negative except where noted in HPI.    CT Renal Stone Study  Result Date: 06/27/2021 CLINICAL DATA:  Left-sided flank pain increasing over the past 2 weeks EXAM: CT ABDOMEN AND PELVIS WITHOUT CONTRAST TECHNIQUE: Multidetector CT imaging of the abdomen and pelvis was performed following the standard protocol without IV contrast. COMPARISON:  11/11/2019 FINDINGS: Lower chest: Mild scarring is noted in the bases bilaterally. Subpleural fibrosis is seen as well. Hepatobiliary: No focal liver abnormality is seen. No gallstones, gallbladder wall thickening, or biliary dilatation. Pancreas: Unremarkable. No pancreatic ductal dilatation or surrounding inflammatory changes. Spleen: Normal in size without focal abnormality. Adrenals/Urinary Tract: Adrenal glands are within normal limits. Kidneys are well visualized bilaterally. Lower pole renal cyst is noted on the left stable from the prior exam. No renal calculi or obstructive changes are seen. The bladder is partially distended. Stomach/Bowel: The appendix is not well visualized. No inflammatory changes to suggest appendicitis are noted. No obstructive or inflammatory changes of the colon or small bowel are seen. The stomach is within normal limits. Vascular/Lymphatic: Atherosclerotic calcifications are noted. Dilatation of the infrarenal aorta is noted to 3 cm. No sizable lymphadenopathy is seen. Reproductive: Uterus and  bilateral adnexa are unremarkable. Other: No abdominal wall hernia or abnormality. No abdominopelvic ascites. Musculoskeletal: Degenerative changes of lumbar spine are noted. IMPRESSION: No renal calculi or obstructive changes are noted. Stable small left lower pole renal cyst. 3 cm infrarenal abdominal aortic aneurysm. Recommend follow-up every 3 years. Reference: J Am Coll Radiol 0254;[id number]. Electronically Signed   By: Inez Catalina M.D.   On: 06/27/2021 01:20    Lab Results  Component Value Date   WBC 6.6 06/26/2021   HGB 14.0 06/26/2021   HCT 44.1 06/26/2021   MCV 90.7 06/26/2021   PLT 212 06/26/2021    Lab Results  Component Value Date   CREATININE 1.05 (H) 06/26/2021   BUN 22 06/26/2021   NA 135 06/26/2021   K 4.4 06/26/2021   CL 100 06/26/2021   CO2 28 06/26/2021    Lab Results  Component Value Date   ALT 16 06/26/2021   AST 20 06/26/2021   ALKPHOS 67 06/26/2021   BILITOT 0.5 06/26/2021     Physical Exam: There were no vitals taken for this visit. Constitutional: Pleasant,well-developed,female in no acute distress.   ASSESSMENT AND PLAN: 75 year old female here for reassessment following:  Chronic constipation Nausea Colon cancer screening  Longstanding chronic constipation.  Extremely tortuous colon, failed prior optical colonoscopy in the past.  In fact was offered partial colectomy for refractory constipation years ago which she declined.  She has had some worsening of her bowel regimen lately, went to the ED and had imaging as outlined above which showed no concerning process with her colon, her labs looked okay.  We had increased her Linzess to twice daily since that episode and she does think that has helped.  She does require use of other laxatives such as MiraLAX or Dulcolax as needed.  Generally since the ED visit she has been doing better and having some productive output on a daily basis but does feel still that she is "backed up" and  we discussed other  options.  I think she may benefit from MiraLAX purge to reset her bowel, discussed how to do this with her and she wishes to proceed.  I think this will help a lot of her gas pains.  She has continued Pepcid as that has resolved her upper tract symptoms and she feels much better on it.  See prior notes for full discussion of colon cancer screening.  She is age 26 at this point, has opted not to pursue any further screening exams given difficulty with colonoscopy in the past.  She has no alarm symptoms such as bleeding, she has no anemia, her CT scan showed normal-appearing colon.  She will contact me if no benefit on the regimen moving forward.  Plan: - continue Linzess 174mcg BID - has been helping - can continue Miralax / dulcolax PRN - Miralax bowel prep PRN if she needs it - continue pepcid - has decided to forgo further colon cancer screening as above.  Jolly Mango, MD [facility]

## 2021-07-23 DIAGNOSIS — Z20828 Contact with and (suspected) exposure to other viral communicable diseases: Secondary | ICD-10-CM | POA: Diagnosis not present

## 2021-09-06 ENCOUNTER — Other Ambulatory Visit: Payer: Self-pay | Admitting: Psychiatry

## 2021-09-06 DIAGNOSIS — G2581 Restless legs syndrome: Secondary | ICD-10-CM

## 2021-09-14 ENCOUNTER — Ambulatory Visit (INDEPENDENT_AMBULATORY_CARE_PROVIDER_SITE_OTHER): Payer: Medicare Other | Admitting: Psychiatry

## 2021-09-14 ENCOUNTER — Other Ambulatory Visit: Payer: Self-pay

## 2021-09-14 DIAGNOSIS — Z62820 Parent-biological child conflict: Secondary | ICD-10-CM

## 2021-09-14 DIAGNOSIS — F17219 Nicotine dependence, cigarettes, with unspecified nicotine-induced disorders: Secondary | ICD-10-CM

## 2021-09-14 DIAGNOSIS — J84112 Idiopathic pulmonary fibrosis: Secondary | ICD-10-CM

## 2021-09-14 DIAGNOSIS — G4721 Circadian rhythm sleep disorder, delayed sleep phase type: Secondary | ICD-10-CM

## 2021-09-14 DIAGNOSIS — I739 Peripheral vascular disease, unspecified: Secondary | ICD-10-CM | POA: Diagnosis not present

## 2021-09-14 DIAGNOSIS — F411 Generalized anxiety disorder: Secondary | ICD-10-CM

## 2021-09-14 DIAGNOSIS — G3184 Mild cognitive impairment, so stated: Secondary | ICD-10-CM | POA: Diagnosis not present

## 2021-09-14 DIAGNOSIS — Z8673 Personal history of transient ischemic attack (TIA), and cerebral infarction without residual deficits: Secondary | ICD-10-CM

## 2021-09-14 DIAGNOSIS — Z8619 Personal history of other infectious and parasitic diseases: Secondary | ICD-10-CM

## 2021-09-14 DIAGNOSIS — F331 Major depressive disorder, recurrent, moderate: Secondary | ICD-10-CM

## 2021-09-14 DIAGNOSIS — Z599 Problem related to housing and economic circumstances, unspecified: Secondary | ICD-10-CM | POA: Diagnosis not present

## 2021-09-14 NOTE — Progress Notes (Signed)
Psychotherapy Progress Note [facility] Luan Moore, PhD LP  Patient ID: [name])    MRN: [id number] Therapy format: Individual psychotherapy Date: 09/14/2021      Start: 1:08p     Stop: 1:58p     Time Spent: 50 min Location: Telehealth visit -- I connected with this patient by an approved telecommunication method (audio only), with her informed consent, and verifying identity and patient privacy.  I was located at my office and patient at her home.  As needed, we discussed the limitations, risks, and security and privacy concerns associated with telehealth service, including the availability and conditions which currently govern in-person appointments and the possibility that 3rd-party payment may not be fully guaranteed and she may be responsible for charges.  After she indicated understanding, we proceeded with the session.  Also discussed treatment planning, as needed, including ongoing verbal agreement with the plan, the opportunity to ask and answer all questions, her demonstrated understanding of instructions, and her readiness to call the office should symptoms worsen or she feels she is in a crisis state and needs more immediate and tangible assistance.   Session narrative (presenting needs, interim history, self-report of stressors and symptoms, applications of prior therapy, status changes, and interventions made in session) Scheduled on cancellation after a "cardiac event" yesterday -- felt palpitations, weakness, swelling, put on oxygen and got 96% sats.  Did not seek medical attention, as she is wont to do, but does feel less able to move, and manage as time presses.  Alarmed about son [name], her only family to lean on, recently going on a MAGA rant blasting her as a Environmental health practitioner, in ways that sound drunk when read out.  Longstanding criticisms of her as controlling, bitchy or whatever, though it also sounds like he has been trying to get her to make sound  financial moves rather than remain at the mercy of a falling market.  He comes in about every other month for 3 or 4 days.    Financially, she is alarmed by stock market declines and remains in an overly large house for her needs and abilities, really only living downstairs.  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  Last worked with [employer], encouraged she get back in touch with that [place], with whom she last worked, to get the next Librarian, academic.  Considering renting out her house, consulting a Secondary school teacher.  Encouraged look at all scenarios.  For now, BIL Nino is healthy enough to keep helping with provisions and check on her some.  Not that mobile, may need transportation to medical visits.    Dog is aging, too.  Admits she's just been "sitting around wishing I was dead" and that the way she is living is accomplishing that.  Is interested in food help, like Meals on Wheels.  Discussed and recommended [facility] [employer] for multiple services including food, financial assessment and decision-making, charitable ride service for health care, and other possibilities, provided number.  Therapeutic modalities: Cognitive Behavioral Therapy, Solution-Oriented/Positive Psychology, Ego-Supportive, and Psycho-education/Bibliotherapy  Mental Status/Observations:  Appearance:   Not assessed     Behavior:  Appropriate  Motor:  Not assessed  Speech/Language:   Clear and Coherent  Affect:  Not assessed  Mood:  depressed  Thought process:  normal  Thought content:    WNL  Sensory/Perceptual disturbances:    WNL  Orientation:  Fully  oriented  Attention:  Good    Concentration:  Good  Memory:  WNL  Insight:    Fair  Judgment:   Fair  Impulse Control:  Fair   Risk Assessment: Danger to Self: No Self-injurious Behavior: No Danger to Others: No Physical Aggression / Violence: No Duty to  Warn: No Access to Firearms a concern: No  Assessment of progress:   deteriorating slowly, in need of supportive services  Diagnosis:   ICD-10-CM   1. Major depressive disorder, recurrent episode, moderate (HCC)  F33.1     2. 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  G31.84     3. Generalized anxiety disorder  F41.1     4. 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 History of CVA (cerebrovascular accident)  Z86.73     6. Relationship problem between parent and child  Z62.820     7. Nicotine dependence, cigarettes, with unspecified nicotine-induced disorders  F17.219     8. History of sepsis  Z86.19     9. PVD (peripheral vascular disease) (HCC)  I73.9     10. Idiopathic pulmonary fibrosis (HCC) (per chart; pt self-describes COPD)  J84.112     11. Financial problems  Z59.9      Plan:  Refer to [employer] for multiple needs Consider checking with Eyvonne Mechanic about creating duplex living.  Alternately, should look into assisted living or in-home services. Reestablish financial advice, likely with Shawna Orleans, to drive down uncertainty about finances Be ready to involve health care for possible cardiopulmonary events Other recommendations/advice as may be noted above Continue to utilize previously learned skills ad lib Maintain medication as prescribed and work faithfully with relevant prescriber(s) if any changes are desired or seem indicated Call the clinic on-call service, 988/hotline, 911, or present to [facility] or ER if any life-threatening psychiatric crisis Return for will call. Already scheduled visit in this office 11/02/2021.  Blanchie Serve, PhD Luan Moore, PhD LP Clinical Psychologist, [facility] [address], [place],  [address] [phone]

## 2021-10-24 ENCOUNTER — Telehealth: Payer: Self-pay | Admitting: Pulmonary Disease

## 2021-10-24 DIAGNOSIS — J8410 Pulmonary fibrosis, unspecified: Secondary | ICD-10-CM

## 2021-10-24 DIAGNOSIS — R0602 Shortness of breath: Secondary | ICD-10-CM

## 2021-10-24 NOTE — Telephone Encounter (Signed)
Please schedule for follow-up  Needs PFT, 6-minute walk, high-resolution CT  May see one of the APP's or Dr. Ronie Spies

## 2021-10-25 NOTE — Telephone Encounter (Signed)
I have tried to call this patient and I was not able to leave a message. Can we get her on Dr. Vaughan Browner or Dr. Chase Caller schedule and get PFT done?

## 2021-10-26 ENCOUNTER — Other Ambulatory Visit: Payer: Self-pay | Admitting: Psychiatry

## 2021-10-26 DIAGNOSIS — G2581 Restless legs syndrome: Secondary | ICD-10-CM

## 2021-10-26 DIAGNOSIS — G4721 Circadian rhythm sleep disorder, delayed sleep phase type: Secondary | ICD-10-CM

## 2021-10-26 DIAGNOSIS — F411 Generalized anxiety disorder: Secondary | ICD-10-CM

## 2021-10-27 NOTE — Telephone Encounter (Signed)
Unable to leave a voicemail, due to voicemail being full. Sent mychart message asking patient to call to schedule. Routing to [facility] for CT scheduling

## 2021-10-27 NOTE — Telephone Encounter (Signed)
There is not a current CT order for pt.  The last order placed was 05/05/20 and it expired 05/05/21.  Will need new order if we are to schedule CT.

## 2021-10-31 ENCOUNTER — Other Ambulatory Visit: Payer: Self-pay | Admitting: Family Medicine

## 2021-10-31 NOTE — Telephone Encounter (Signed)
I have order the HRCT

## 2021-11-02 ENCOUNTER — Ambulatory Visit: Payer: Medicare Other | Admitting: Psychiatry

## 2021-11-03 ENCOUNTER — Encounter: Payer: Self-pay | Admitting: Psychiatry

## 2021-11-03 ENCOUNTER — Ambulatory Visit (INDEPENDENT_AMBULATORY_CARE_PROVIDER_SITE_OTHER): Payer: Medicare Other | Admitting: Psychiatry

## 2021-11-03 DIAGNOSIS — G4721 Circadian rhythm sleep disorder, delayed sleep phase type: Secondary | ICD-10-CM

## 2021-11-03 DIAGNOSIS — F411 Generalized anxiety disorder: Secondary | ICD-10-CM | POA: Diagnosis not present

## 2021-11-03 DIAGNOSIS — F331 Major depressive disorder, recurrent, moderate: Secondary | ICD-10-CM | POA: Diagnosis not present

## 2021-11-03 DIAGNOSIS — Z9114 Patient's other noncompliance with medication regimen: Secondary | ICD-10-CM

## 2021-11-03 DIAGNOSIS — F332 Major depressive disorder, recurrent severe without psychotic features: Secondary | ICD-10-CM

## 2021-11-03 DIAGNOSIS — G3184 Mild cognitive impairment, so stated: Secondary | ICD-10-CM

## 2021-11-03 DIAGNOSIS — G2581 Restless legs syndrome: Secondary | ICD-10-CM

## 2021-11-03 DIAGNOSIS — Z8673 Personal history of transient ischemic attack (TIA), and cerebral infarction without residual deficits: Secondary | ICD-10-CM

## 2021-11-03 MED ORDER — LAMOTRIGINE 25 MG PO TABS: 75.0000 mg | ORAL_TABLET | Freq: Every day | ORAL | 1 refills | Status: DC

## 2021-11-03 MED ORDER — DULOXETINE HCL 30 MG PO CPEP: 30.0000 mg | ORAL_CAPSULE | Freq: Two times a day (BID) | ORAL | 1 refills | Status: DC

## 2021-11-03 MED ORDER — ROPINIROLE HCL 2 MG PO TABS: 2.0000 mg | ORAL_TABLET | Freq: Every day | ORAL | 1 refills | Status: DC

## 2021-11-03 MED ORDER — CLONAZEPAM 0.5 MG PO TABS: ORAL_TABLET | ORAL | 3 refills | Status: DC

## 2021-11-03 NOTE — Progress Notes (Signed)
[name] [id number] [birth year] 76 y.o.   Virtual Visit via Telephone Note  I connected with pt by telephone and verified that I am speaking with the correct person using two identifiers.   I discussed the limitations, risks, security and privacy concerns of performing an evaluation and management service by telephone and the availability of in person appointments. I also discussed with the patient that there may be a patient responsible charge related to this service. The patient expressed understanding and agreed to proceed.  I discussed the assessment and treatment plan with the patient. The patient was provided an opportunity to ask questions and all were answered. The patient agreed with the plan and demonstrated an understanding of the instructions.   The patient was advised to call back or seek an in-person evaluation if the symptoms worsen or if the condition fails to improve as anticipated.  I provided 30 minutes of non-face-to-face time during this encounter. The patient was located at home and the provider was located office. Session from 4-430 pm Televisit bc pt cannot drive bc of eyes  Subjective:   Patient ID:  [name] is a 76 y.o. (DOB [birth year]) female.  Chief Complaint:  Chief Complaint  Patient presents with   Follow-up   Depression   Anxiety   Sleeping Problem   Memory Loss    Depression        Associated symptoms include decreased concentration and fatigue.  Associated symptoms include no suicidal ideas.  Past medical history includes anxiety.   Anxiety Symptoms include decreased concentration, dizziness, nausea, nervous/anxious behavior and shortness of breath. Patient reports no chest pain, confusion, palpitations or suicidal ideas.    [name] presents to the office today for follow-up of depression and anxiety and MCI.    at visit January 09, 2019.  She was having problems with restless legs and ropinirole was increased to 3 mg every  afternoon.  There were no other med changes.  visit Feb 05, 2019.  She was having mixture of depression and anxiety and chronic insomnia with delayed sleep phase.  For the reasons mentioned at the last visit she was started on risperidone 0.25 mg nightly.  She was maintained on duloxetine 90 mg, clonazepam 1 mg nightly, lamotrigine 150 twice daily, and ropinirole 2 mg 1-1/2 tablets nightly for restless legs.  visit September 2020.  She had stopped the risperidone because she only took it a few days.  She felt nausea and spaced out on it.  No other meds were changed.  Last visit December 2020.  The following was noted: Doing very poorly.  Not getting dressed and not out of the house for 5 days.  Hard to walk with pain.  Falling apart overnight.  Feet hurt at night.  Worries over poor circulation to toes.  Placed on blood thinners.  Continued health problems with COPD.  Energy is low and hard time walking.  Arthritis in back.  House on the market and that's stressful.  Sx largely unchanged but "it's circumstances".  Planning to move to [facility] but ambivalent.  Brother in law lives next door and wants her to stay here.  Also stressed over politics and social things.   Had period of crippling nausea and cut all meds back in 1/2.  Thinks it's mental issue.  Nausea is some better with dose reduction but is more depressed.  Plan: Use pill box to improve compliance. DT nausea  split meds but increase duloxetine 30 mg BID  and lamotrigine 75 mg BID. She's been on lower dose DT nausea but is more depressed.  January 02, 2020 appointment, the following is noted: She got septic with a UTI and then was admitted to [facility]. Home now.  Getting stronger.  Having to take nausea meds. Also diarrhea.  Would like to be on higher dose of meds but can't tolerate it.  Still on some prednisone.  Pending cardiology with CHF.  Also pulmonology. Sleep is pretty good with meds.   No meds changed.  04/26/20 appt  with the following noted:  Missed a lot of med DT nausea.  Back on duloxetine 60 and lamotrigine 75 mg for a week.  Was more depressed and dizzy off the meds. Nausea comes and goes in waves but could be any time of day but maybe worse in the evening. Eats poorly usually.  If feels bad doesn't want to cook.   Pepcid helps nausea.  Zofran doesn't help. Lately more trouble falling asleep.  Plan: Rec trial mirtazapine 30 mg HS to help with depression and potentially nausea hopefully.  Nausea is interfering with depression treatment.  08/04/20 appt with following noted: Took mirtazapine 30 mg for a week and felt like a zombie with jitteriness outside.  Didn't help nausea much.  Noticing a pattern with nausea related to constipation and bowel problems.  Not ready to take lamotrigine bc it's hard to swallow but felt emotionally better when on it.  Wonders if bipolar bc jumps from topic to topic and talks a lot. Wants to stay on clonazepam to help her sleep.  Aware of memory concerns. Not taking lorazepam but not sure why she isn't.  Thinks it doesn't work as well as clonazepam.  Says she's taking clonazepam at night now. Plan;  Restart lamotrigine at 25 mg and use the small tablets to try to help mood and make it easier to swallow.  Purpose to help mood.  She felt it helped. Will leave duloxetine at 30 low dose. DC mirtazapine DT SE  10/11/2020 appointment with the following noted:  Started and stopped lamotrigine bc not sticking with schedule so just up to lamotrigine 50 mg daily. RLS requires ropinirole.  Sometimes forgets it and has to keep moving.    Periods of being horrible condition with depression but not suicidal.  Hasnt' been that bad lately.  Still grieving [name] and her sister.  Taking a long time.  Watch too much TV.  Sister [name] was her best friend.   Sometimes feels her presence. No energy to move back to [place] but son encourages it.  Procrastinates it bc moving is overwhelming.   Eating OK.  Enjoyed seeing son and gkids.   Nausea better but comes and goes. Plan: Continue lamotrigine increase to 100 mg as recommended and use the small tablets to try to help mood and make it easier to swallow.  Purpose to help mood.  She felt it helped.  01/03/2021 appointment with following noted: Up some times and down some times.  Manages.  Kind of average. Not as depressed as in the past.  Still getting dizzy easily.  Sleep hours getting worse instead of bettter.  Once of twice weekly stays up all night and then naps.  Doesn't work out well for her body. Still tends to nausea but variable regardless of what she takes.  Most days consistent with lamotrigine.  Easier to swallow the small lamotrigine 25 vs 100 mg tablets. Don't feel the same since the  Sepsis. Going to get driver's license renewed tomorrow. RLS managed with ropinirole.   Sleep pretty good, except irregular hours.Pt reports that mood is Depressed worse at night and describes anxiety as worse and moderate in the same dose for depression but comes and goes.  She wonders if she might be bipolar like her sister.She is also having problems with irritability. Anxiety symptoms include: Excessive Worry, Panic Symptoms,. Pt reports sleep is more regular and consistent with melatonin 10 mg nightly.. Pt reports that appetite is decreased. Pt reports that energy is poor and loss of interest or pleasure in usual activities, poor motivation and withdrawn from usual activities. Concentration is scattered.  Suicidal thoughts:  denied by patient. B In Law next door.   Increased ropinirole helped RLS. Plan: No med changes  04/12/2021 appointment with the following noted: Had to stop driving bc of dizzy.  Eyes flutter.  Balance not always good.  Back pain. Fighting depression.  Fight with [place] over her moving to [place] and doesn't want to help. Still in [place].  Was hoping to move back to [place]. Lonely here and would like support of family. Otherwise  feels better than last year.  Needs son to help her move there and doesn't understand why. Not profoundly depressed but is down.   Lately increased clonazepam to 0.5 mg just at night for anxiety and helps her sleep better and have less anxious thoughts.   Not great sleep.  Eats frozen foods. B in law helps her. Wants to increase duloxetine to 60 mg daily but split dose bc of nausea easily. Plan: Wants to increase duloxetine to 60 mg daily but split dose bc of nausea easily.  06/16/2021 appt noted: Increased duloxetine to 60 mg daily and thinks it helped a little better.  But depression worse with family stress going on.  Son gets mad at her and yells.  Keeping GS from her and this is upsetting.   Can talk with him over the phone.  D-in-law keeping GS from her and it's wrong.  Son blames her but that's not true.   Doesn't think she's strong enough to pack up and move to [place].  Chronic dizziness and risk of falling.  Shuffles too much.   She does get online socially.   Pays attention to the news.   Satisfied with meds. Drinks diet Pepsi and coffee up until dinner. Plan: no med changes  11/03/21 appt noted: Hard to walk and probably needs TKR but no one to take care of her locally. Dropped duloxetine to 30 mg daily and lamotrigine to 25 mg 2 daily. Still on ropinirole 2 mg PM,  clonazepam 0.5 mg BID  No SE Anxiety cycles but lately it has calmed down.   Son visits from [place] every 6 weeks which helps her a lot.  Occ brings 78 yo GS. Other 2 GD are married. Was very close to her sister who has been dead 69 years and still grieving.  Her H still helps her with chores. H passed 3 years ago. Falls asleep on couch and sometimes not in bed.  Pattern irregular chronically. Neck pain. RLS managed if taken early enough.  Her sister had bipolar disorder.  Past Psychiatric Medication Trials: Lithium 300 insomnia, Depakote, olanzapine 2.5,  lamotrigine 300, ropinirole as high as 2 mg 3 times  daily for restless legs,  Aricept poorly tolerated,  Abilify side effects,  fluoxetine, sertraline, paroxetine, Lexapro, duloxetine 90,  Mirtazapine 30 zombie Donepezil SE Long psychiatric history.  Sister bipolar. I have never prescribed lorazepam nor Xanax  Review of Systems:  Review of Systems  Constitutional:  Positive for fatigue.  Eyes:  Positive for visual disturbance.  Respiratory:  Positive for shortness of breath.   Cardiovascular:  Negative for chest pain and palpitations.       Claudication   Gastrointestinal:  Positive for nausea. Negative for anal bleeding.  Musculoskeletal:  Positive for back pain, gait problem and neck pain.  Neurological:  Positive for dizziness and weakness. Negative for tremors.  Psychiatric/Behavioral:  Positive for decreased concentration, depression, dysphoric mood and sleep disturbance. Negative for agitation, behavioral problems, confusion, hallucinations, self-injury and suicidal ideas. The patient is nervous/anxious. The patient is not hyperactive.   LEG surgery for claudication Jan 3  Medications: I have reviewed the patient's current medications.  Current Outpatient Medications  Medication Sig Dispense Refill   albuterol (VENTOLIN HFA) 108 (90 Base) MCG/ACT inhaler Inhale 1-2 puffs into the lungs every 6 (six) hours as needed for wheezing or shortness of breath. 18 g 2   clonazePAM (KLONOPIN) 0.5 MG tablet TAKE 1 TABLET(0.5 MG) BY MOUTH TWICE DAILY AS NEEDED FOR ANXIETY 60 tablet 1   DULoxetine (CYMBALTA) 30 MG capsule Take 1 capsule (30 mg total) by mouth 2 (two) times daily. (Patient taking differently: Take 30 mg by mouth daily. 1 tab daily) 180 capsule 1   famotidine (PEPCID) 20 MG tablet One at bedtime 30 tablet 11   lamoTRIgine (LAMICTAL) 25 MG tablet Take 2 tablets (50 mg total) by mouth daily. 4 daily. (Patient taking differently: Take 25 mg by mouth daily. 2 tabs daily) 360 tablet 1   linaclotide (LINZESS) 145 MCG CAPS capsule Take  1 capsule (145 mcg total) by mouth 2 (two) times daily. 60 capsule 3   Magnesium Hydroxide (DULCOLAX PO) Take by mouth as needed.     Melatonin 10 MG TABS Take 20 mg by mouth at bedtime.     rOPINIRole (REQUIP) 2 MG tablet TAKE 1 TABLET(2 MG) BY MOUTH EVERY EVENING 90 tablet 1   Senna EXTR Take by mouth as needed.     XARELTO 20 MG TABS tablet TAKE 1 TABLET(20 MG) BY MOUTH DAILY WITH SUPPER 30 tablet 11   No current facility-administered medications for this visit.    Medication Side Effects: None unless nausea  Allergies: No Known Allergies  Past Medical History:  Diagnosis Date   AAA (abdominal aortic aneurysm)    3.1 cm 07/08/18, 3 year follow-up recommended; possible 3 cm AAA by aortogram 09/13/18   Anemia    PMH   Appendicitis with abscess    07/08/18, s/p perc drain; resolved 07/30/18 by CT   Arthritis    Bipolar disorder ([place])    Cerebrovascular disease    intra and extracranial vascular dx per MRI 4/11, neurology rec strict CVRF control   Colonic inertia    Constipation    chronic;severe   Coronary artery disease    Depression    Duodenitis    EKG abnormalities    changes, stress test neg (false EKG changes)   Gastritis    GERD (gastroesophageal reflux disease)    Hypertension    Hypothyroid 01/16/2014   Meningioma ([place]) 10/21/2013   PAD (peripheral artery disease) (HCC)    Psoriasis    sees derm   Stroke [facility])    Wears dentures    Wears glasses     Family History  Problem Relation Age of Onset   Breast cancer Mother  metastisis to bones   Cancer Sister 91       spinal   Alcohol abuse Brother    Heart disease Brother    Lung cancer Brother        smoked   Diabetes Son    Heart disease Maternal Aunt    Heart disease Other        grandfather    Colon cancer Neg Hx    Esophageal cancer Neg Hx    Stomach cancer Neg Hx     Social History   Socioeconomic History   Marital status: Widowed    Spouse name: Not on file   Number of children: 1    Years of education: Not on file   Highest education level: Not on file  Occupational History   Occupation: retired  Tobacco Use   Smoking status: Every Day    Packs/day: 0.50    Years: 60.00    Pack years: 30.00    Types: Cigarettes    Last attempt to quit: 11/10/2019    Years since quitting: 1.9   Smokeless tobacco: Never  Vaping Use   Vaping Use: Former  Substance and Sexual Activity   Alcohol use: Yes    Alcohol/week: 0.0 standard drinks    Comment: yes on occassion   Drug use: No   Sexual activity: Not Currently  Other Topics Concern   Not on file  Social History Narrative   Brother in law Mr Rexford Maus (one of my patients)   Lives w/ husband       Social Determinants of Health   Financial Resource Strain: Low Risk    Difficulty of Paying Living Expenses: Not hard at all  Food Insecurity: No Food Insecurity   Worried About Charity fundraiser in the Last Year: Never true   Arboriculturist in the Last Year: Never true  Transportation Needs: No Transportation Needs   Lack of Transportation (Medical): No   Lack of Transportation (Non-Medical): No  Physical Activity: Inactive   Days of Exercise per Week: 0 days   Minutes of Exercise per Session: 0 min  Stress: Not on file  Social Connections: Not on file  Intimate Partner Violence: Not on file    Past Medical History, Surgical history, Social history, and Family history were reviewed and updated as appropriate.   Please see review of systems for further details on the patient's review from today.   Objective:   Physical Exam:  There were no vitals taken for this visit.  Physical Exam Neurological:     Mental Status: She is alert and oriented to person, place, and time.     Cranial Nerves: No dysarthria.  Psychiatric:        Attention and Perception: She is inattentive. She does not perceive auditory hallucinations.        Mood and Affect: Mood is anxious and depressed.        Speech: Speech normal.  Speech is not slurred.        Behavior: Behavior is not slowed or aggressive. Behavior is cooperative.        Thought Content: Thought content normal. Thought content is not paranoid or delusional. Thought content does not include homicidal or suicidal ideation. Thought content does not include suicidal plan.        Cognition and Memory: Memory is impaired. She exhibits impaired recent memory.        Judgment: Judgment is not impulsive or inappropriate.  Comments:   Still depressed, anxious and less irritable.  Insight and judgment are fair Talkative Gets meds mixed up at times and changes with unclear reasons at times    Lab Review:     Component Value Date/Time   NA 135 06/26/2021 2347   NA 135 01/02/2020 1602   K 4.4 06/26/2021 2347   CL 100 06/26/2021 2347   CO2 28 06/26/2021 2347   GLUCOSE 125 (H) 06/26/2021 2347   BUN 22 06/26/2021 2347   BUN 17 01/02/2020 1602   CREATININE 1.05 (H) 06/26/2021 2347   CREATININE 0.77 07/28/2015 1805   CALCIUM 9.7 06/26/2021 2347   PROT 7.8 06/26/2021 2347   PROT 7.9 01/02/2020 1602   ALBUMIN 3.7 06/26/2021 2347   ALBUMIN 4.2 01/02/2020 1602   AST 20 06/26/2021 2347   ALT 16 06/26/2021 2347   ALKPHOS 67 06/26/2021 2347   BILITOT 0.5 06/26/2021 2347   BILITOT 0.2 01/02/2020 1602   GFRNONAA 55 (L) 06/26/2021 2347   GFRAA 58 (L) 01/02/2020 1602       Component Value Date/Time   WBC 6.6 06/26/2021 2347   RBC 4.86 06/26/2021 2347   HGB 14.0 06/26/2021 2347   HGB 11.9 01/02/2020 1602   HCT 44.1 06/26/2021 2347   HCT 36.4 01/02/2020 1602   PLT 212 06/26/2021 2347   PLT 370 01/02/2020 1602   MCV 90.7 06/26/2021 2347   MCV 90 01/02/2020 1602   MCH 28.8 06/26/2021 2347   MCHC 31.7 06/26/2021 2347   RDW 14.0 06/26/2021 2347   RDW 14.0 01/02/2020 1602   LYMPHSABS 2.6 06/26/2021 2347   LYMPHSABS 2.7 01/02/2020 1602   MONOABS 0.5 06/26/2021 2347   EOSABS 0.1 06/26/2021 2347   EOSABS 0.1 01/02/2020 1602   BASOSABS 0.0 06/26/2021 2347    BASOSABS 0.0 01/02/2020 1602    No results found for: POCLITH, LITHIUM   No results found for: PHENYTOIN, PHENOBARB, VALPROATE, CBMZ   .res Assessment: Plan:    Major depressive disorder, recurrent episode, moderate (HCC)  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  Generalized anxiety disorder  Delayed sleep phase syndrome  History of CVA (cerebrovascular accident)  Restless legs syndrome  Noncompliance with medication regimen  Severe episode of recurrent major depressive disorder, without psychotic features ([place])  [name] has some chronic depression and chronic irregular sleep patterns which have been resistant to treatment.  Her chronic depression and anxiety are complicated now by double grief of loss of her sister and now her husband.  She is lonely and mood affected by recent severe health problems.   Chronic depression  Chronic N and dizziness are ongoing problems.  Chronic issues with compliance but doesn't seem to make a lot of change in her psychiatric symptoms.. Therefore will not attempt changes today.  Disc compliance again.  She  Reduced lamotrigine back to 50 mg daily for unclear reasons and use the small tablets to try to help mood and make it easier to swallow.  Purpose to help mood.    Cont counseling for grief, depression, anxiety, and health crises.  Supportive therapy dealing with grief. Try to watch more positive things and not just news.  Dizziness is likely DT prior strokes and not meds.  She reduced duloxetine 30 mg daily but split dose bc of nausea easily.  Had to increase ropinirole 3 mg for RLS.  This is better controlled her restless legs.   Stop the coffee after dinner.   Disc SE incl hallucinations  and dizziness.   This high dosage appears medically necessary.  Call if this fails and will consider doing something with the clonazepam either increasing it or perhaps switching to another benzodiazepine which  might have less cognitive problems.  Such as lorazepam Continue clonazepam at LED at 0.25-0.5 mg BID prn anxiety. We discussed the short-term risks associated with benzodiazepines including sedation and increased fall risk among others.  Discussed long-term side effect risk including dependence, potential withdrawal symptoms, and the potential eventual dose-related risk of dementia.  But recent studies from 2020 dispute this association between benzodiazepines and dementia risk. Newer studies in 2020 do not support an association with dementia.  Emphasized importance and proper nutrition and sleep and protein.  Doesn't eat much meat bc constipation but will eat fish.  Discussed how protein is necessary for the proper response to depression medicines.  Can't cook easily.    Disc sleeping in bed instead of sofa to ease neck pain and improve sleep quality.  Explained in depth.  No med changes She has some difficulty understanding the med changes and keeping up with them.  FU 3-4 mos.  Lynder Parents, MD, DFAPA  Please see After Visit Summary for patient specific instructions.  Future Appointments  Date Time Provider [place]  11/18/2021  3:40 PM [facility] 1 [facility] [address] [facility]  12/07/2021  2:00 PM Olalere, Adewale A, MD [facility] None     No orders of the defined types were placed in this encounter.      -------------------------------

## 2021-11-10 DIAGNOSIS — Z20822 Contact with and (suspected) exposure to covid-19: Secondary | ICD-10-CM | POA: Diagnosis not present

## 2021-11-18 ENCOUNTER — Inpatient Hospital Stay: Admission: RE | Admit: 2021-11-18 | Payer: Medicare Other | Source: Ambulatory Visit

## 2021-12-07 ENCOUNTER — Ambulatory Visit: Payer: Medicare Other | Admitting: Family Medicine

## 2021-12-07 ENCOUNTER — Ambulatory Visit: Payer: Medicare Other | Admitting: Pulmonary Disease

## 2021-12-12 ENCOUNTER — Ambulatory Visit: Payer: Medicare Other | Admitting: Family Medicine

## 2021-12-12 ENCOUNTER — Inpatient Hospital Stay: Admission: RE | Admit: 2021-12-12 | Payer: Medicare Other | Source: Ambulatory Visit

## 2021-12-12 ENCOUNTER — Ambulatory Visit
Admission: RE | Admit: 2021-12-12 | Discharge: 2021-12-12 | Disposition: A | Discharge status: Home / Self Care | Payer: Medicare Other | Source: Ambulatory Visit | Attending: Pulmonary Disease | Admitting: Pulmonary Disease

## 2021-12-12 DIAGNOSIS — J8410 Pulmonary fibrosis, unspecified: Secondary | ICD-10-CM

## 2021-12-12 DIAGNOSIS — J432 Centrilobular emphysema: Secondary | ICD-10-CM | POA: Diagnosis not present

## 2021-12-12 DIAGNOSIS — J929 Pleural plaque without asbestos: Secondary | ICD-10-CM | POA: Diagnosis not present

## 2021-12-12 DIAGNOSIS — I2510 Atherosclerotic heart disease of native coronary artery without angina pectoris: Secondary | ICD-10-CM | POA: Diagnosis not present

## 2021-12-12 DIAGNOSIS — J479 Bronchiectasis, uncomplicated: Secondary | ICD-10-CM | POA: Diagnosis not present

## 2021-12-14 ENCOUNTER — Encounter: Payer: Self-pay | Admitting: Family Medicine

## 2021-12-14 ENCOUNTER — Ambulatory Visit (INDEPENDENT_AMBULATORY_CARE_PROVIDER_SITE_OTHER): Payer: Medicare Other | Admitting: Family Medicine

## 2021-12-14 VITALS — BP 138/78 | HR 78 | Temp 98.2°F | Resp 16 | Ht 66.0 in

## 2021-12-14 DIAGNOSIS — N9089 Other specified noninflammatory disorders of vulva and perineum: Secondary | ICD-10-CM | POA: Diagnosis not present

## 2021-12-14 DIAGNOSIS — N3946 Mixed incontinence: Secondary | ICD-10-CM

## 2021-12-14 DIAGNOSIS — N898 Other specified noninflammatory disorders of vagina: Secondary | ICD-10-CM

## 2021-12-14 DIAGNOSIS — L89321 Pressure ulcer of left buttock, stage 1: Secondary | ICD-10-CM | POA: Diagnosis not present

## 2021-12-14 DIAGNOSIS — M25561 Pain in right knee: Secondary | ICD-10-CM | POA: Diagnosis not present

## 2021-12-14 MED ORDER — FLUCONAZOLE 150 MG PO TABS: 150.0000 mg | ORAL_TABLET | Freq: Once | ORAL | 0 refills | Status: AC

## 2021-12-14 NOTE — Progress Notes (Signed)
? ?Subjective:  ?Patient ID: [name], female    DOB: 01-31-1946  Age: 76 y.o. MRN: [id number] ? ?CC:  ?Chief Complaint  ?Patient presents with  ? Knee Pain  ?  Pt reports Rt pain 3 years knee pain notes cartilage was removed several years ago. Pt has not had imaging or ortho consult in several years ago    ? Referral  ?  Pt needs to see urology notes night time incontinence and having to use diapers which she thinks is causing her to have recurrent yeast infection and she does request a referral to GYN to care for this issue   ? ? ?HPI ?[name] presents for  ?Symptoms as above.  Last visit with me 05/11/2021.  Multiple concerns addressed at that time, and plan for follow-up in 1 month.  First visit back with me since that time. Denies other PCP, wasn't feeling well to follow up.  ? ?Right knee pain ?Ongoing symptoms for last few years. ?Had cartilage removed 10 years ago - [facility] - told may need knee replacement. Would like to see different provider. Worse pain at times - comes and goes. Some days able to walk better, others using cane. Alleve on occasion - meds make sick to stomach. Tries to avoid. No recent imaging. No pain meds used.  ? ?Urinary incontinence ?Some urinary leakage when cough, sneezing, walking, and trouble getting to bathroom in time. Rarely with bowel leakage, small stool. Usually constipated. ?Using miralax 2-3 times per week, linzess. Followed by Dr. Havery Moros. Some skin tags near rectum.  ?No recent gynecology or urology eval. Would like to meet with gynecology.  ?Wearing incontinence pads, vaginal itching and white discharge past 6 months. No vaginal bleeding.  ?Tx: none.  ? ? ? ? ?History ?Patient Active Problem List  ? Diagnosis Date Noted  ? Centrilobular emphysema ([place]) 10/19/2020  ? Chronic respiratory failure with hypoxia ([place]) 12/26/2019  ? Shortness of breath 12/26/2019  ? Bilateral pulmonary infiltrates on CXR   ? Complicated UTI (urinary tract infection) 11/11/2019   ? AKI (acute kidney injury) ([place]) 11/11/2019  ? Hyponatremia 11/11/2019  ? Elevated LFTs 11/11/2019  ? Sepsis ([place]) 11/11/2019  ? Wound infection 11/07/2018  ? Postoperative pain   ? Sleep disturbance   ? Tobacco abuse   ? Acute blood loss anemia   ? Hypoalbuminemia due to protein-calorie malnutrition ([place])   ? Debility 10/24/2018  ? Pre-operative cardiovascular examination 09/26/2018  ? HLD (hyperlipidemia) 09/26/2018  ? GAD (generalized anxiety disorder) 08/07/2018  ? Major depressive disorder, single episode 08/07/2018  ? Appendiceal abscess 07/08/2018  ? PVD (peripheral vascular disease) ([place]) 03/15/2018  ? Atherosclerosis of native artery of left lower extremity with intermittent claudication ([place]) 03/15/2018  ? Chronic left-sided low back pain with left-sided sciatica 12/25/2017  ? Facial droop   ? History of CVA (cerebrovascular accident) 10/23/2017  ? Critical lower limb ischemia ([place]) 11/08/2016  ? Smoker 11/08/2016  ? Postinflammatory pulmonary fibrosis ([place]) 02/14/2016  ? Cigarette smoker 12/24/2015  ? Hypothyroid ? 01/16/2014  ? Mild cognitive impairment 01/16/2014  ? Meningioma ([place]) 10/21/2013  ? Chest pain 10/20/2013  ? Medicare annual wellness visit, subsequent 06/16/2013  ? Dizziness and giddiness 06/16/2013  ? RLS (restless legs syndrome) 04/29/2012  ? Abdominal pain 12/27/2010  ? DEGENERATIVE JOINT DISEASE 09/23/2010  ? CAROTID ARTERY DISEASE 08/15/2010  ? CHEST PAIN 08/15/2010  ? HEADACHE 12/02/2009  ? BACK PAIN 11/22/2009  ? CONSTIPATION, CHRONIC 01/29/2008  ? NAUSEA 01/28/2008  ?  DEPRESSION 03/08/2007  ? HTN (hypertension) 03/08/2007  ? ?Past Medical History:  ?Diagnosis Date  ? AAA (abdominal aortic aneurysm) ([place])   ? 3.1 cm 07/08/18, 3 year follow-up recommended; possible 3 cm AAA by aortogram 09/13/18  ? Anemia   ? PMH  ? Appendicitis with abscess   ? 07/08/18, s/p perc drain; resolved 07/30/18 by CT  ? Arthritis   ? Bipolar disorder ([place])   ? Cerebrovascular disease   ? intra and  extracranial vascular dx per MRI 4/11, neurology rec strict CVRF control  ? Colonic inertia   ? Constipation   ? chronic;severe  ? Coronary artery disease   ? Depression   ? Duodenitis   ? EKG abnormalities   ? changes, stress test neg (false EKG changes)  ? Gastritis   ? GERD (gastroesophageal reflux disease)   ? Hypertension   ? Hypothyroid 01/16/2014  ? Meningioma ([place]) 10/21/2013  ? PAD (peripheral artery disease) ([place])   ? Psoriasis   ? sees derm  ? Stroke [facility])   ? Wears dentures   ? Wears glasses   ? ?Past Surgical History:  ?Procedure Laterality Date  ? ABDOMINAL AORTOGRAM W/LOWER EXTREMITY N/A 09/13/2018  ? Procedure: ABDOMINAL AORTOGRAM W/LOWER EXTREMITY;  Surgeon: Elam Dutch, MD;  Location: [facility];  Service: Cardiovascular;  Laterality: N/A;  ? arthroscopy  04/2010  ? Right knee  ? CATARACT EXTRACTION W/ INTRAOCULAR LENS  IMPLANT, BILATERAL    ? COLONOSCOPY    ? FEMORAL-POPLITEAL BYPASS GRAFT  10/22/2018  ? FEMORAL-POPLITEAL BYPASS GRAFT Left 10/22/2018  ? Procedure: LEFT FEMORAL TO BELOW THE KNEE POPLITEAL ARTERY BYPASS GRAFT;  Surgeon: Elam Dutch, MD;  Location: [facility];  Service: Vascular;  Laterality: Left;  ? I & D EXTREMITY Left 11/08/2018  ? Procedure: IRRIGATION AND DEBRIDEMENT EXTREMITY Left Leg;  Surgeon: Elam Dutch, MD;  Location: [facility];  Service: Vascular;  Laterality: Left;  ? IR RADIOLOGIST EVAL & MGMT  07/30/2018  ? MULTIPLE TOOTH EXTRACTIONS    ? TUBAL LIGATION    ? ?No Known Allergies ?Prior to Admission medications   ?Medication Sig Start Date End Date Taking? Authorizing Provider  ?albuterol (VENTOLIN HFA) 108 (90 Base) MCG/ACT inhaler Inhale 1-2 puffs into the lungs every 6 (six) hours as needed for wheezing or shortness of breath. 10/19/20  Yes Martyn Ehrich, NP  ?clonazePAM (KLONOPIN) 0.5 MG tablet TAKE 1 TABLET(0.5 MG) BY MOUTH TWICE DAILY AS NEEDED FOR ANXIETY 11/03/21  Yes Cottle, Billey Co., MD  ?DULoxetine (CYMBALTA) 30 MG capsule Take 1 capsule (30 mg  total) by mouth 2 (two) times daily. 11/03/21  Yes Cottle, Billey Co., MD  ?famotidine (PEPCID) 20 MG tablet One at bedtime 02/14/16  Yes Tanda Rockers, MD  ?lamoTRIgine (LAMICTAL) 25 MG tablet Take 3 tablets (75 mg total) by mouth daily. 11/03/21  Yes Cottle, Billey Co., MD  ?linaclotide Rolan Lipa) 145 MCG CAPS capsule Take 1 capsule (145 mcg total) by mouth 2 (two) times daily. 07/01/21  Yes Armbruster, Carlota Raspberry, MD  ?Magnesium Hydroxide (DULCOLAX PO) Take by mouth as needed.   Yes [provider]  ?Melatonin 10 MG TABS Take 20 mg by mouth at bedtime.   Yes [provider]  ?rOPINIRole (REQUIP) 2 MG tablet Take 1 tablet (2 mg total) by mouth at bedtime. 11/03/21  Yes Cottle, Billey Co., MD  ?Dyke Brackett Take by mouth as needed.   Yes [provider]  ?Alveda Reasons  20 MG TABS tablet TAKE 1 TABLET(20 MG) BY MOUTH DAILY WITH SUPPER 10/31/21  Yes [name], MD  ? ?Social History  ? ?Socioeconomic History  ? Marital status: Widowed  ?  Spouse name: Not on file  ? Number of children: 1  ? Years of education: Not on file  ? Highest education level: Not on file  ?Occupational History  ? Occupation: retired  ?Tobacco Use  ? Smoking status: Every Day  ?  Packs/day: 0.50  ?  Years: 60.00  ?  Pack years: 30.00  ?  Types: Cigarettes  ?  Last attempt to quit: 11/10/2019  ?  Years since quitting: 2.0  ? Smokeless tobacco: Never  ?Vaping Use  ? Vaping Use: Former  ?Substance and Sexual Activity  ? Alcohol use: Yes  ?  Alcohol/week: 0.0 standard drinks  ?  Comment: yes on occassion  ? Drug use: No  ? Sexual activity: Not Currently  ?Other Topics Concern  ? Not on file  ?Social History Narrative  ? Brother in law Mr Rexford Maus (one of my patients)  ? Lives w/ husband  ?    ? ?Social Determinants of Health  ? ?Financial Resource Strain: Low Risk   ? Difficulty of Paying Living Expenses: Not hard at all  ?Food Insecurity: No Food Insecurity  ? Worried About Charity fundraiser in the Last Year: Never true   ? Ran Out of Food in the Last Year: Never true  ?Transportation Needs: No Transportation Needs  ? Lack of Transportation (Medical): No  ? Lack of Transportation (Non-Medical): No  ?Physical Activity: Inactive

## 2021-12-14 NOTE — Patient Instructions (Addendum)
I will refer you to urology to discuss the incontinence, see information below.  Diflucan 1 pill once for possible yeast infection, repeat in 1 week if persistent itching.  I am also referring you to gynecology to evaluate the itching, and vaginal irritation.  They may recommend other medication/treatments if persistent. ? ?X-ray for your knee at [facility].  Please have that performed in the next week.  If there are concerns I will let you know but I have also referred you to orthopedics.  See information below knee pain.  You can temporarily use over-the-counter Voltaren gel up to 4 times per day if needed for knee pain flare and okay to use cane for now for walking assistance. ? ?[facility] xray ?Walk in 8:30-4:30 during weekdays, no appointment needed ?[place]  ?[place], [place] [address] ? ? ?Rash on buttocks appear to be possible early pressure wounds.  Try to shift pressure to various areas and move throughout the day to lessen irritation to that area.  Clean area with soap and water and can apply clean bandage if any open wound.  Recheck 3 weeks.  Sooner if worse.  ? ?Chronic Knee Pain, Adult ?Chronic knee pain is pain in one or both knees that lasts longer than 3 months. Symptoms of chronic knee pain may include swelling, stiffness, and discomfort. Age-related wear and tear (osteoarthritis) of the knee joint is the most common cause of chronic knee pain. Other possible causes include: ?A long-term immune-related disease that causes inflammation of the knee (rheumatoid arthritis). This usually affects both knees. ?Inflammatory arthritis, such as gout or pseudogout. ?An injury to the knee that causes arthritis. ?An injury to the knee that damages the ligaments. Ligaments are strong tissues that connect bones to each other. ?Runner's knee or pain behind the kneecap. ?Treatment for chronic knee pain depends on the cause. The main treatments for chronic knee pain are physical therapy and weight loss. This  condition may also be treated with medicines, injections, a knee sleeve or brace, and by using crutches. Rest, ice, pressure (compression), and elevation, also known as RICE therapy, may also be recommended. ?Follow these instructions at home: ?If you have a knee sleeve or brace: ? ?Wear the knee sleeve or brace as told by your health care provider. Remove it only as told by your health care provider. ?Loosen it if your toes tingle, become numb, or turn cold and blue. ?Keep it clean. ?If the sleeve or brace is not waterproof: ?Do not let it get wet. ?Remove it if allowed by your health care provider, or cover it with a watertight covering when you take a bath or a shower. ?Managing pain, stiffness, and swelling ?  ?If directed, apply heat to the affected area as often as told by your health care provider. Use the heat source that your health care provider recommends, such as a moist heat pack or a heating pad. ?If you have a removable knee sleeve or brace, remove it as told by your health care provider. ?Place a towel between your skin and the heat source. ?Leave the heat on for 20-30 minutes. ?Remove the heat if your skin turns bright red. This is especially important if you are unable to feel pain, heat, or cold. You may have a greater risk of getting burned. ?If directed, put ice on the affected area. To do this: ?If you have a removable knee sleeve or brace, remove it as told by your health care provider. ?Put ice  in a plastic bag. ?Place a towel between your skin and the bag. ?Leave the ice on for 20 minutes, 2-3 times a day. ?Remove the ice if your skin turns bright red. This is very important. If you cannot feel pain, heat, or cold, you have a greater risk of damage to the area. ?Move your toes often to reduce stiffness and swelling. ?Raise (elevate) the injured area above the level of your heart while you are sitting or lying down. ?Activity ?Avoid high-impact activities or exercises, such as running,  jumping rope, or doing jumping jacks. ?Follow the exercise plan that your health care provider designed for you. Your health care provider may suggest that you: ?Avoid activities that make knee pain worse. This may require you to change your exercise routines, sport participation, or job duties. ?Wear shoes with cushioned soles. ?Avoid sports that require running and sudden changes in direction. ?Do physical therapy. Physical therapy is planned to match your needs and abilities. It may include exercises for strength, flexibility, stability, and endurance. ?Do exercises that increase balance and strength, such as tai chi and yoga. ?Do not use the injured limb to support your body weight until your health care provider says that you can. Use crutches as told by your health care provider. ?Return to your normal activities as told by your health care provider. Ask your health care provider what activities are safe for you. ?General instructions ?Take over-the-counter and prescription medicines only as told by your health care provider. ?Lose weight if you are overweight. Losing even a little weight can reduce knee pain. Ask your health care provider what your ideal weight is, and how to safely lose extra weight. A dietitian may be able to help you plan your meals. ?Do not use any products that contain nicotine or tobacco, such as cigarettes, e-cigarettes, and chewing tobacco. These can delay healing. If you need help quitting, ask your health care provider. ?Keep all follow-up visits. This is important. ?Contact a health care provider if: ?You have knee pain that is not getting better or gets worse. ?You are unable to do your physical therapy exercises due to knee pain. ?Get help right away if: ?Your knee swells and the swelling becomes worse. ?You cannot move your knee. ?You have severe knee pain. ?Summary ?Knee pain that lasts more than 3 months is considered chronic knee pain. ?The main treatments for chronic knee  pain are physical therapy and weight loss. You may also need to take medicines, wear a knee sleeve or brace, use crutches, and apply ice or heat. ?Losing even a little weight can reduce knee pain. Ask your health care provider what your ideal weight is, and how to safely lose extra weight. A dietitian may be able to help you plan your meals. ?Follow the exercise plan that your health care provider designed for you. ?This information is not intended to replace advice given to you by your health care provider. Make sure you discuss any questions you have with your health care provider. ?Document Revised: 02/11/2020 Document Reviewed: 02/11/2020 ?Elsevier Patient Education ? [place]. ? ? ?Pressure Injury ?A pressure injury is damage to the skin and underlying tissue that results from pressure being applied to an area of the body. It often affects people who must spend a long time in a bed or chair because of a medical condition. ?Pressure injuries usually occur: ?Over bony parts of the body, such as the tailbone, shoulders, elbows, hips, heels, spine, ankles, and back  of the head. ?Under medical devices that make contact with the body, such as respiratory equipment, stockings, tubes, and splints. ?Pressure injuries start as reddened areas on the skin and can lead to pain and an open wound. ?What are the causes? ?This condition is caused by frequent or constant pressure to an area of the body. Decreased blood flow to the skin can eventually cause the skin tissue to die and break down, causing a wound. ?What increases the risk? ?You are more likely to develop this condition if you: ?Are in the hospital or an extended care facility. ?Are bedridden or in a wheelchair. ?Have an injury or disease that keeps you from: ?Moving normally. ?Feeling pain or pressure. ?Have a condition that: ?Makes you sleepy or less alert. ?Causes poor blood flow. ?Need to wear a medical device. ?Have poor control of your bladder or  bowel functions (incontinence). ?Have poor nutrition (malnutrition). ?If you are at risk for pressure injuries, your health care provider may recommend certain types of mattresses, mattress covers, pillows, cushi

## 2021-12-20 ENCOUNTER — Ambulatory Visit: Payer: Medicare Other | Admitting: Pulmonary Disease

## 2021-12-21 ENCOUNTER — Ambulatory Visit: Payer: Medicare Other | Admitting: Pulmonary Disease

## 2021-12-26 ENCOUNTER — Ambulatory Visit: Payer: Medicare Other | Admitting: Nurse Practitioner

## 2021-12-26 ENCOUNTER — Ambulatory Visit: Payer: Medicare Other | Admitting: Physician Assistant

## 2021-12-27 DIAGNOSIS — Z20822 Contact with and (suspected) exposure to covid-19: Secondary | ICD-10-CM | POA: Diagnosis not present

## 2021-12-30 ENCOUNTER — Ambulatory Visit: Payer: Medicare Other | Admitting: Physician Assistant

## 2022-01-05 ENCOUNTER — Ambulatory Visit: Payer: Medicare Other | Admitting: Orthopaedic Surgery

## 2022-01-05 DIAGNOSIS — Z20822 Contact with and (suspected) exposure to covid-19: Secondary | ICD-10-CM | POA: Diagnosis not present

## 2022-01-12 ENCOUNTER — Ambulatory Visit: Payer: Medicare Other | Admitting: Pulmonary Disease

## 2022-01-17 ENCOUNTER — Encounter: Payer: Self-pay | Admitting: Psychiatry

## 2022-01-17 ENCOUNTER — Ambulatory Visit (INDEPENDENT_AMBULATORY_CARE_PROVIDER_SITE_OTHER): Payer: Medicare Other | Admitting: Psychiatry

## 2022-01-17 DIAGNOSIS — G2581 Restless legs syndrome: Secondary | ICD-10-CM | POA: Diagnosis not present

## 2022-01-17 DIAGNOSIS — Z91148 Patient's other noncompliance with medication regimen for other reason: Secondary | ICD-10-CM | POA: Diagnosis not present

## 2022-01-17 DIAGNOSIS — G4721 Circadian rhythm sleep disorder, delayed sleep phase type: Secondary | ICD-10-CM

## 2022-01-17 DIAGNOSIS — Z8673 Personal history of transient ischemic attack (TIA), and cerebral infarction without residual deficits: Secondary | ICD-10-CM

## 2022-01-17 DIAGNOSIS — G3184 Mild cognitive impairment, so stated: Secondary | ICD-10-CM

## 2022-01-17 DIAGNOSIS — F411 Generalized anxiety disorder: Secondary | ICD-10-CM

## 2022-01-17 DIAGNOSIS — F331 Major depressive disorder, recurrent, moderate: Secondary | ICD-10-CM

## 2022-01-17 NOTE — Progress Notes (Signed)
[name] ?[id number] ?11/12/1945 ?76 y.o.  ? ?Virtual Visit via Telephone Note ? ?I connected with pt by telephone and verified that I am speaking with the correct person using two identifiers. ?  ?I discussed the limitations, risks, security and privacy concerns of performing an evaluation and management service by telephone and the availability of in person appointments. I also discussed with the patient that there may be a patient responsible charge related to this service. The patient expressed understanding and agreed to proceed. ? ?I discussed the assessment and treatment plan with the patient. The patient was provided an opportunity to ask questions and all were answered. The patient agreed with the plan and demonstrated an understanding of the instructions. ?  ?The patient was advised to call back or seek an in-person evaluation if the symptoms worsen or if the condition fails to improve as anticipated. ? ?I provided 30 minutes of non-face-to-face time during this encounter. The patient was located at home and the provider was located office. ?Session from 330-400 ?Televisit bc pt cannot drive bc of eyes ? ?Subjective:  ? ?Patient ID:  [name] is a 76 y.o. (DOB [birth year]) female. ? ?Chief Complaint:  ?Chief Complaint  ?Patient presents with  ? Follow-up  ? Depression  ? Stress  ?  health  ? ? ?Depression ?       Associated symptoms include decreased concentration and fatigue.  Associated symptoms include no suicidal ideas.  Past medical history includes anxiety.   ?Anxiety ?Symptoms include decreased concentration, dizziness, nausea, nervous/anxious behavior and shortness of breath. Patient reports no confusion, palpitations or suicidal ideas.  ? ? ?[name] presents to the office today for follow-up of depression and anxiety and MCI.   ? ?at visit January 09, 2019.  She was having problems with restless legs and ropinirole was increased to 3 mg every afternoon.  There were no other med  changes. ? ?visit Feb 05, 2019.  She was having mixture of depression and anxiety and chronic insomnia with delayed sleep phase.  For the reasons mentioned at the last visit she was started on risperidone 0.25 mg nightly.  She was maintained on duloxetine 90 mg, clonazepam 1 mg nightly, lamotrigine 150 twice daily, and ropinirole 2 mg 1-1/2 tablets nightly for restless legs. ? ?visit September 2020.  She had stopped the risperidone because she only took it a few days.  She felt nausea and spaced out on it.  No other meds were changed. ? ?Last visit December 2020.  The following was noted: ?Doing very poorly.  Not getting dressed and not out of the house for 5 days.  Hard to walk with pain.  Falling apart overnight.  Feet hurt at night.  Worries over poor circulation to toes.  Placed on blood thinners.  Continued health problems with COPD.  Energy is low and hard time walking.  Arthritis in back.  House on the market and that's stressful.  Sx largely unchanged but "it's circumstances".  Planning to move to [facility] but ambivalent.  Brother in law lives next door and wants her to stay here.  Also stressed over politics and social things.   ?Had period of crippling nausea and cut all meds back in 1/2.  Thinks it's mental issue.  Nausea is some better with dose reduction but is more depressed.  ?Plan: Use pill box to improve compliance. ?DT nausea  split meds but increase duloxetine 30 mg BID and lamotrigine 75 mg BID. ?She's been  on lower dose DT nausea but is more depressed. ? ?January 02, 2020 appointment, the following is noted: ?She got septic with a UTI and then was admitted to [facility]. ?Home now.  Getting stronger.  Having to take nausea meds. Also diarrhea.  Would like to be on higher dose of meds but can't tolerate it.  Still on some prednisone.  Pending cardiology with CHF.  Also pulmonology. ?Sleep is pretty good with meds.   ?No meds changed. ? ?04/26/20 appt with the following noted: ? Missed a  lot of med DT nausea.  Back on duloxetine 60 and lamotrigine 75 mg for a week.  Was more depressed and dizzy off the meds. ?Nausea comes and goes in waves but could be any time of day but maybe worse in the evening. Eats poorly usually.  If feels bad doesn't want to cook.   ?Pepcid helps nausea.  Zofran doesn't help. ?Lately more trouble falling asleep.  ?Plan: Rec trial mirtazapine 30 mg HS to help with depression and potentially nausea hopefully.  Nausea is interfering with depression treatment. ? ?08/04/20 appt with following noted: ?Took mirtazapine 30 mg for a week and felt like a zombie with jitteriness outside.  Didn't help nausea much.  Noticing a pattern with nausea related to constipation and bowel problems.  Not ready to take lamotrigine bc it's hard to swallow but felt emotionally better when on it.  ?Wonders if bipolar bc jumps from topic to topic and talks a lot. ?Wants to stay on clonazepam to help her sleep.  Aware of memory concerns. ?Not taking lorazepam but not sure why she isn't.  Thinks it doesn't work as well as clonazepam.  Says she's taking clonazepam at night now. ?Plan;  Restart lamotrigine at 25 mg and use the small tablets to try to help mood and make it easier to swallow.  Purpose to help mood.  She felt it helped. ?Will leave duloxetine at 30 low dose. ?DC mirtazapine DT SE ? ?10/11/2020 appointment with the following noted: ? Started and stopped lamotrigine bc not sticking with schedule so just up to lamotrigine 50 mg daily. ?RLS requires ropinirole.  Sometimes forgets it and has to keep moving.    ?Periods of being horrible condition with depression but not suicidal.  Hasnt' been that bad lately.  Still grieving [name] and her sister.  Taking a long time.  Watch too much TV.  Sister [name] was her best friend.   Sometimes feels her presence. ?No energy to move back to [place] but son encourages it.  Procrastinates it bc moving is overwhelming.  ?Eating OK.  Enjoyed seeing son and  gkids.   ?Nausea better but comes and goes. ?Plan: Continue lamotrigine increase to 100 mg as recommended and use the small tablets to try to help mood and make it easier to swallow.  Purpose to help mood.  She felt it helped. ? ?01/03/2021 appointment with following noted: ?Up some times and down some times.  Manages.  Kind of average. Not as depressed as in the past.  Still getting dizzy easily.  Sleep hours getting worse instead of bettter.  Once of twice weekly stays up all night and then naps.  Doesn't work out well for her body. Still tends to nausea but variable regardless of what she takes.  Most days consistent with lamotrigine.  Easier to swallow the small lamotrigine 25 vs 100 mg tablets. ?Don't feel the same since the Sepsis. ?Going to get driver's license renewed  tomorrow. ?RLS managed with ropinirole.   Sleep pretty good, except irregular hours.Pt reports that mood is Depressed worse at night and describes anxiety as worse and moderate in the same dose for depression but comes and goes.  She wonders if she might be bipolar like her sister.She is also having problems with irritability. Anxiety symptoms include: Excessive Worry, ?Panic Symptoms,. Pt reports sleep is more regular and consistent with melatonin 10 mg nightly.. Pt reports that appetite is decreased. Pt reports that energy is poor and loss of interest or pleasure in usual activities, poor motivation and withdrawn from usual activities. Concentration is scattered.  Suicidal thoughts:  denied by patient. ?B In Law next door.   ?Increased ropinirole helped RLS. ?Plan: No med changes ? ?04/12/2021 appointment with the following noted: ?Had to stop driving bc of dizzy.  Eyes flutter.  Balance not always good.  Back pain. ?Fighting depression.  Fight with [place] over [place] moving to [place] and doesn't want to help. ?Still in [place].  Was hoping to move back to [place]. ?Lonely here and would like support of family. ?Otherwise feels better than last year.  Needs  son to help her move there and doesn't understand why. ?Not profoundly depressed but is down.   ?Lately increased clonazepam to 0.5 mg just at night for anxiety and helps her sleep better and have less anxious though

## 2022-01-18 ENCOUNTER — Ambulatory Visit: Payer: Medicare Other | Admitting: Orthopaedic Surgery

## 2022-01-19 ENCOUNTER — Ambulatory Visit: Payer: Medicare Other | Admitting: Orthopaedic Surgery

## 2022-01-24 ENCOUNTER — Ambulatory Visit: Payer: Medicare Other | Admitting: Orthopaedic Surgery

## 2022-02-01 ENCOUNTER — Ambulatory Visit: Payer: Medicare Other | Admitting: Orthopaedic Surgery

## 2022-03-01 ENCOUNTER — Ambulatory Visit: Payer: Self-pay

## 2022-03-01 DIAGNOSIS — I10 Essential (primary) hypertension: Secondary | ICD-10-CM

## 2022-03-01 DIAGNOSIS — I5033 Acute on chronic diastolic (congestive) heart failure: Secondary | ICD-10-CM

## 2022-03-01 NOTE — Patient Instructions (Signed)
Visit Information  Thank you for allowing me to share the care management and care coordination services that are available to you as part of your health plan and services through your primary care provider and medical home. Please reach out to me at [phone] if the care management/care coordination team may be of assistance to you in the future.   Ardene Remley RN, BSN,CCM, CDE Care Management Coordinator [facility] [phone]   

## 2022-03-01 NOTE — Chronic Care Management (AMB) (Signed)
  Care Management   Follow Up Note   03/01/2022 Name: [name] MRN: [id number] DOB: September 24, 1945   Referred by: [name], MD Reason for referral : Chronic Care Management (Case Closure)   Case closed unable to maintain contact  Follow Up Plan: No further follow up required: Case closed unable to maintain contact  [place], Jackquline Denmark, CDE Care Management Coordinator [facility] [phone]

## 2022-05-04 ENCOUNTER — Ambulatory Visit: Payer: Medicare Other | Admitting: Nurse Practitioner

## 2022-05-11 ENCOUNTER — Ambulatory Visit: Payer: Medicare Other | Admitting: Nurse Practitioner

## 2022-05-19 ENCOUNTER — Ambulatory Visit: Payer: Medicare Other | Admitting: Nurse Practitioner

## 2022-05-23 ENCOUNTER — Telehealth: Payer: Medicare Other | Admitting: Psychiatry

## 2022-05-24 ENCOUNTER — Ambulatory Visit: Payer: Medicare Other | Admitting: Psychiatry

## 2022-05-24 DIAGNOSIS — Z91199 Patient's noncompliance with other medical treatment and regimen due to unspecified reason: Secondary | ICD-10-CM

## 2022-05-24 NOTE — Progress Notes (Signed)
Admin note for non-service contact  Patient ID: [name]  MRN: [id number] DATE: 05/24/2022  Pt not seen for almost 9 months, scheduled short notice this morning from the cancellation list, and unreachable for 2pm phone appt.  Tried twice during the hour with no answer, left messages to be back in touch.  Subsequently learned from staff member SW that [name] had called in yesterday to see if she could schedule soon with Dr. Clovis Pu, not realizing she has an annual appointment with him on Friday this week.  While on the call, she indicated it's been a while for therapy, she having serious mobility problems, needs it to be telehealth, doesn't know how to work video (but could take phone),  assumed she could be worked in same or next day, said it's an emergency, balked at a 9am offer as too early, and balked at being informed that her insurance would not cover her seeing both of [place] on Friday, when an early afternoon spot was available to her.  Put together, it sounds like cognitive issues have been mounting during the time out of touch.  No charge, as appt was made on short notice in the first place.  Scheduled Friday with Dr. Clovis Pu, has voicemail instructions how to retry and has been made aware by staff that Tuesday 1pm is available on request starting Friday.  Blanchie Serve, PhD Luan Moore, PhD LP Clinical Psychologist, [facility] [address] [place], [place] [address] [phone]

## 2022-05-26 ENCOUNTER — Telehealth (INDEPENDENT_AMBULATORY_CARE_PROVIDER_SITE_OTHER): Payer: Self-pay | Admitting: Psychiatry

## 2022-05-26 DIAGNOSIS — Z91199 Patient's noncompliance with other medical treatment and regimen due to unspecified reason: Secondary | ICD-10-CM

## 2022-05-26 NOTE — Progress Notes (Signed)
Unalble to reach her for visit

## 2022-05-30 ENCOUNTER — Ambulatory Visit (INDEPENDENT_AMBULATORY_CARE_PROVIDER_SITE_OTHER): Payer: Medicare Other | Admitting: Psychiatry

## 2022-05-30 DIAGNOSIS — G2581 Restless legs syndrome: Secondary | ICD-10-CM | POA: Diagnosis not present

## 2022-05-30 DIAGNOSIS — F331 Major depressive disorder, recurrent, moderate: Secondary | ICD-10-CM | POA: Diagnosis not present

## 2022-05-30 DIAGNOSIS — G3184 Mild cognitive impairment, so stated: Secondary | ICD-10-CM | POA: Diagnosis not present

## 2022-05-30 DIAGNOSIS — F411 Generalized anxiety disorder: Secondary | ICD-10-CM

## 2022-05-30 DIAGNOSIS — Z8673 Personal history of transient ischemic attack (TIA), and cerebral infarction without residual deficits: Secondary | ICD-10-CM | POA: Diagnosis not present

## 2022-05-30 DIAGNOSIS — Z91148 Patient's other noncompliance with medication regimen for other reason: Secondary | ICD-10-CM | POA: Diagnosis not present

## 2022-05-30 DIAGNOSIS — G4721 Circadian rhythm sleep disorder, delayed sleep phase type: Secondary | ICD-10-CM

## 2022-05-30 DIAGNOSIS — F17219 Nicotine dependence, cigarettes, with unspecified nicotine-induced disorders: Secondary | ICD-10-CM | POA: Diagnosis not present

## 2022-05-30 DIAGNOSIS — J84112 Idiopathic pulmonary fibrosis: Secondary | ICD-10-CM | POA: Diagnosis not present

## 2022-05-30 DIAGNOSIS — I739 Peripheral vascular disease, unspecified: Secondary | ICD-10-CM

## 2022-05-30 DIAGNOSIS — Z8619 Personal history of other infectious and parasitic diseases: Secondary | ICD-10-CM | POA: Diagnosis not present

## 2022-05-30 DIAGNOSIS — J9611 Chronic respiratory failure with hypoxia: Secondary | ICD-10-CM | POA: Diagnosis not present

## 2022-05-30 DIAGNOSIS — Z9189 Other specified personal risk factors, not elsewhere classified: Secondary | ICD-10-CM

## 2022-05-30 NOTE — Progress Notes (Signed)
Psychotherapy Progress Note [facility] Jenny Moore, PhD LP  Patient ID: [name])    MRN: [id number] Therapy format: Individual psychotherapy Date: 05/30/2022      Start: 1:10p     Stop: 2:00p     Time Spent: 50 min Location: Telehealth visit -- I connected with this patient by an approved telecommunication method (audio only), with her informed consent, and verifying identity and patient privacy.  I was located at my office and patient at her home.  As needed, we discussed the limitations, risks, and security and privacy concerns associated with telehealth service, including the availability and conditions which currently govern in-person appointments and the possibility that 3rd-party payment may not be fully guaranteed and she may be responsible for charges.  After she indicated understanding, we proceeded with the session.  Also discussed treatment planning, as needed, including ongoing verbal agreement with the plan, the opportunity to ask and answer all questions, her demonstrated understanding of instructions, and her readiness to call the office should symptoms worsen or she feels she is in a crisis state and needs more immediate and tangible assistance.   Session narrative (presenting needs, interim history, self-report of stressors and symptoms, applications of prior therapy, status changes, and interventions made in session) No showed for phone appointments with both Dr. Clovis [name] and myself last week.  Says she set an alarm, slept through.  Sounds hoarse, winded, says she's off schedule with all her meds, can't reliably swallow, gets dizzy.  Does still have an oxygen machine, doesn't believe she's been getting hypoxic, but she is sick a lot lately and worried.  Eyes are too bad to drive.  BIL [name] next door checks on her, brings groceries and convenience meals.  Keeps making and cancelling doctor's appts.  Sleeps a lot on the couch.    As meds go, says she's  only actually faithful with ropinirole, for RLS -- not her blood thinner, antidepressant, or long-acting sedative.  Figures it's been 3 weeks since each of those now, which may mean she's made it through withdrawal, but being off blood thinner seems particularly risky with her stroke hx.    Also claims she's developing a rash on her rear end and abdomen, finding the skin turns to blisters and gets flaky.  Using a lot of Vaseline, ordered rash medicine like Desitin.  C/o hard, brownish lumps on her skin, too.  Unclear if these are iatrogenic reactions to dysfunctional self-care for the rashes.  The rashy places will bleed if she scratches, and they all definitely itch but don't all correspond to pressure sores.  Could be from inadequate hygiene.  Says PCP witnessed these sores in April when they were small, didn't seem to think much of them, joked with her about sitting too much.  Is using incontinent briefs now (only from [place] -- they're softer) and believes she mostly washes adequately.  Admittedly doesn't drink much but Diet Pepsi, and admittedly improper nutrition, most of it microwaveable things BIL picks up, sometimes potatoes and meat on delivery.  Does acknowledge burning in her bladder, suspects UTI.  Still in night-owl schedule.  Always tended that way from childhood, bio F [name] was the same.  Says she's shuffling her feet now like he did, though he died at 48yo.    Faults son [name], who visited a month ago, for shaming her instead of helping her, though she may have hidden her condition, too.  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  Estranged from granddaughters, too, says now they're "bitches" (?), allegedly mad that [name] didn't take up the one on a half-baked plan to move to [place] area.  Still plays stocks, complains about performance and perception that the government is sabotaging  investments.  Acknowledged, empathized, and redirected to clear priority health needs.  Discussed possibilities for home health authorized through PCP, brief hospital stay, and potential to go through rehab like she did before.  Is interested and motivated but requires redirection.  Addressed financial fears by reminding her her house is large and worth a good bit yet.  Says she tried to approach [name] about consolidating households but he refused.  Advocated to trust that her finances would work out and recognize that she saved for just such a rainy day in life.  Outlined likely costs of senior living at different levels, provided she gets treated promptly enough to have choices.  Agreed to call PCP, let [place] message him to be sure her condition is recognized.  Forewarned that if her condition deteriorates further, it would be necessary to call APS to begin with an in-home assessment.  Therapeutic modalities: Cognitive Behavioral Therapy, Solution-Oriented/Positive Psychology, Ego-Supportive, and crisis assessment  Mental Status/Observations:  Appearance:   Not assessed     Behavior:  distractible  Motor:  Not assessed  Speech/Language:   fluent  Affect:  Not assessed  Mood:  anxious, depressed, and irritable  Thought process:  tangential  Thought content:    Obsessions  Sensory/Perceptual disturbances:    WNL  Orientation:  Fully oriented  Attention:  Good    Concentration:  Fair  Memory:  grossly intact  Insight:    Fair  Judgment:   Fair  Impulse Control:  Fair   Risk Assessment: Danger to Self: No Self-injurious Behavior: risk of self-neglect Danger to Others: No Physical Aggression / Violence: No Duty to Warn: No Access to Firearms a concern: No  Assessment of progress:  deteriorating -- in need of reassessment  Diagnosis:   ICD-10-CM   1. Major depressive disorder, recurrent episode, moderate (HCC)  F33.1     2. Generalized anxiety disorder  F41.1     3. 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 History of CVA (cerebrovascular accident)  Z86.73     5. Noncompliance with medication regimen  Z91.148     6. Restless legs syndrome  G25.81     7. 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  G31.84     8. Nicotine dependence, cigarettes, with unspecified nicotine-induced disorders  F17.219     9. History of sepsis  Z86.19     10. PVD (peripheral vascular disease) (HCC)  I73.9     11. Idiopathic pulmonary fibrosis (HCC) (per chart; pt self-describes COPD)  J84.112     12. Chronic respiratory failure with hypoxia ([place])  J96.11      Plan:  [facility] will inform, and PT will contact, PCP for urgent assessment. Prioritize personal health care over all concerns about family relationships, respect or lack thereof, and financial concerns.  Self-affirm higher care is necessary and affordable, do not talk self out of engaging treatment based on imagined financial issues. Other recommendations/advice as may be noted above Continue to utilize previously learned skills ad lib Maintain medication as prescribed and work faithfully with relevant prescriber(s) if any changes are desired or  seem indicated Call the clinic on-call service, 988/hotline, 911, or present to [facility] or ER if any life-threatening psychiatric crisis Return in about 2 weeks (around 06/13/2022) for time as available. Already scheduled visit in this office Visit date not found.  Blanchie Serve, PhD Jenny Moore, PhD LP Clinical Psychologist, [facility] [address] [place], [place] [address] [phone]

## 2022-06-01 ENCOUNTER — Encounter (HOSPITAL_BASED_OUTPATIENT_CLINIC_OR_DEPARTMENT_OTHER): Payer: Self-pay | Admitting: Emergency Medicine

## 2022-06-01 ENCOUNTER — Telehealth: Payer: Self-pay | Admitting: Family Medicine

## 2022-06-01 ENCOUNTER — Other Ambulatory Visit: Payer: Self-pay

## 2022-06-01 ENCOUNTER — Encounter (HOSPITAL_COMMUNITY): Payer: Self-pay

## 2022-06-01 ENCOUNTER — Emergency Department (HOSPITAL_BASED_OUTPATIENT_CLINIC_OR_DEPARTMENT_OTHER)
Admission: EM | Admit: 2022-06-01 | Discharge: 2022-06-01 | Disposition: A | Discharge status: Home / Self Care | Payer: Medicare Other | Attending: Emergency Medicine | Admitting: Emergency Medicine

## 2022-06-01 ENCOUNTER — Encounter: Payer: Self-pay | Admitting: Family Medicine

## 2022-06-01 ENCOUNTER — Emergency Department (HOSPITAL_BASED_OUTPATIENT_CLINIC_OR_DEPARTMENT_OTHER): Payer: Medicare Other

## 2022-06-01 ENCOUNTER — Telehealth (INDEPENDENT_AMBULATORY_CARE_PROVIDER_SITE_OTHER): Payer: Medicare Other | Admitting: Family Medicine

## 2022-06-01 DIAGNOSIS — R21 Rash and other nonspecific skin eruption: Secondary | ICD-10-CM | POA: Diagnosis not present

## 2022-06-01 DIAGNOSIS — Z7901 Long term (current) use of anticoagulants: Secondary | ICD-10-CM | POA: Diagnosis not present

## 2022-06-01 DIAGNOSIS — R0602 Shortness of breath: Secondary | ICD-10-CM | POA: Diagnosis not present

## 2022-06-01 DIAGNOSIS — R410 Disorientation, unspecified: Secondary | ICD-10-CM | POA: Diagnosis not present

## 2022-06-01 DIAGNOSIS — I2510 Atherosclerotic heart disease of native coronary artery without angina pectoris: Secondary | ICD-10-CM | POA: Insufficient documentation

## 2022-06-01 DIAGNOSIS — J9811 Atelectasis: Secondary | ICD-10-CM | POA: Diagnosis not present

## 2022-06-01 DIAGNOSIS — R112 Nausea with vomiting, unspecified: Secondary | ICD-10-CM | POA: Diagnosis not present

## 2022-06-01 DIAGNOSIS — J8410 Pulmonary fibrosis, unspecified: Secondary | ICD-10-CM | POA: Insufficient documentation

## 2022-06-01 DIAGNOSIS — L24A2 Irritant contact dermatitis due to fecal, urinary or dual incontinence: Secondary | ICD-10-CM | POA: Insufficient documentation

## 2022-06-01 DIAGNOSIS — J84112 Idiopathic pulmonary fibrosis: Secondary | ICD-10-CM

## 2022-06-01 DIAGNOSIS — Z91148 Patient's other noncompliance with medication regimen for other reason: Secondary | ICD-10-CM | POA: Diagnosis not present

## 2022-06-01 LAB — CBC
HCT: 41.4 % (ref 36.0–46.0)
Hemoglobin: 13.6 g/dL (ref 12.0–15.0)
MCH: 29 pg (ref 26.0–34.0)
MCHC: 32.9 g/dL (ref 30.0–36.0)
MCV: 88.3 fL (ref 80.0–100.0)
Platelets: 227 10*3/uL (ref 150–400)
RBC: 4.69 MIL/uL (ref 3.87–5.11)
RDW: 13.7 % (ref 11.5–15.5)
WBC: 9.2 10*3/uL (ref 4.0–10.5)
nRBC: 0 % (ref 0.0–0.2)

## 2022-06-01 LAB — COMPREHENSIVE METABOLIC PANEL
ALT: 11 U/L (ref 0–44)
AST: 20 U/L (ref 15–41)
Albumin: 3.7 g/dL (ref 3.5–5.0)
Alkaline Phosphatase: 67 U/L (ref 38–126)
Anion gap: 8 (ref 5–15)
BUN: 24 mg/dL — ABNORMAL HIGH (ref 8–23)
CO2: 28 mmol/L (ref 22–32)
Calcium: 9.3 mg/dL (ref 8.9–10.3)
Chloride: 100 mmol/L (ref 98–111)
Creatinine, Ser: 1.41 mg/dL — ABNORMAL HIGH (ref 0.44–1.00)
GFR, Estimated: 38 mL/min — ABNORMAL LOW (ref >60)
Glucose, Bld: 112 mg/dL — ABNORMAL HIGH (ref 70–99)
Potassium: 3.4 mmol/L — ABNORMAL LOW (ref 3.5–5.1)
Sodium: 136 mmol/L (ref 135–145)
Total Bilirubin: 0.6 mg/dL (ref 0.3–1.2)
Total Protein: 7.8 g/dL (ref 6.5–8.1)

## 2022-06-01 LAB — BRAIN NATRIURETIC PEPTIDE: B Natriuretic Peptide: 206.8 pg/mL — ABNORMAL HIGH (ref 0.0–100.0)

## 2022-06-01 MED ORDER — CEPHALEXIN 500 MG PO CAPS: 500.0000 mg | ORAL_CAPSULE | Freq: Four times a day (QID) | ORAL | 0 refills | Status: DC

## 2022-06-01 MED ORDER — MORPHINE SULFATE (PF) 4 MG/ML IV SOLN
4.0000 mg | Freq: Once | INTRAVENOUS | Status: DC
  Filled 2022-06-01: qty 1

## 2022-06-01 MED ORDER — FLUCONAZOLE 150 MG PO TABS: 150.0000 mg | ORAL_TABLET | Freq: Every day | ORAL | 0 refills | Status: AC

## 2022-06-01 MED ORDER — ONDANSETRON 4 MG PO TBDP
4.0000 mg | ORAL_TABLET | Freq: Once | ORAL | Status: AC
  Administered 2022-06-01: 4 mg via ORAL
  Filled 2022-06-01: qty 1

## 2022-06-01 NOTE — ED Provider Notes (Signed)
[facility] EMERGENCY DEPARTMENT Provider Note   CSN: [id number] Arrival date & time: 06/01/22  1953     History  Chief Complaint  Patient presents with   Wound Check   Shortness of Breath    [name] is a 76 y.o. female, history of pulmonary fibrosis, who presents to the ED secondary to multiple complaints.   She reports that she has skin breakdown that is painful in her buttocks, and periarea.  She has tried putting Vaseline on it without any relief.  Is also cleansing it with hydrogen peroxide which burned she states.  Denies any redness or swelling to the area.  Does endorse having urinary incontinence and often has a difficult time keeping her private area dry.  Also has a complaint of shortness of breath, that is worse on exertion.  Has a history of pulmonary fibrosis and is on oxygen, she is not sure how much oxygen she is on however.  She does see pulmonology.  Has not been using her inhalers as she states she just does not want to.  States she can walk her across the room, just becomes short of breath.  Denies any chest pain, lower extremity swelling.  Additionally she states that she has some social concerns states she feels like she cannot take care of herself at home anymore.  Her brother-in-law brings her food, and groceries.  However she states that she is having a difficult time cleaning her house and doing daily activities.  Is able to ambulate, but states that she is stopped taking any of her psych medications due to nausea, and this is resulted in difficulty with her daily activities.     Home Medications Prior to Admission medications   Medication Sig Start Date End Date Taking? Authorizing Provider  cephALEXin (KEFLEX) 500 MG capsule Take 1 capsule (500 mg total) by mouth 4 (four) times daily. 06/01/22  Yes Shamonique Battiste L, PA  fluconazole (DIFLUCAN) 150 MG tablet Take 1 tablet (150 mg total) by mouth daily for 3 days. 06/01/22 06/04/22 Yes Jeter Tomey  L, PA  albuterol (VENTOLIN HFA) 108 (90 Base) MCG/ACT inhaler Inhale 1-2 puffs into the lungs every 6 (six) hours as needed for wheezing or shortness of breath. Patient not taking: Reported on 06/01/2022 10/19/20   Martyn Ehrich, NP  clonazePAM (KLONOPIN) 0.5 MG tablet TAKE 1 TABLET(0.5 MG) BY MOUTH TWICE DAILY AS NEEDED FOR ANXIETY 11/03/21   Cottle, Billey Co., MD  DULoxetine (CYMBALTA) 30 MG capsule Take 1 capsule (30 mg total) by mouth 2 (two) times daily. Patient not taking: Reported on 06/01/2022 11/03/21   Purnell Shoemaker., MD  famotidine (PEPCID) 20 MG tablet One at bedtime 02/14/16   Tanda Rockers, MD  lamoTRIgine (LAMICTAL) 25 MG tablet Take 3 tablets (75 mg total) by mouth daily. Patient not taking: Reported on 06/01/2022 11/03/21   Cottle, Billey Co., MD  linaclotide [facility]) 145 MCG CAPS capsule Take 1 capsule (145 mcg total) by mouth 2 (two) times daily. Patient not taking: Reported on 06/01/2022 07/01/21   Yetta Flock, MD  Magnesium Hydroxide (DULCOLAX PO) Take by mouth as needed.    [provider]  Melatonin 10 MG TABS Take 20 mg by mouth at bedtime.    [provider]  rOPINIRole (REQUIP) 2 MG tablet Take 1 tablet (2 mg total) by mouth at bedtime. 11/03/21   Cottle, Billey Co., MD  Senna EXTR Take by mouth as needed.  [provider]  XARELTO 20 MG TABS tablet TAKE 1 TABLET(20 MG) BY MOUTH DAILY WITH SUPPER 10/31/21   [name], MD      Allergies    Patient has no known allergies.    Review of Systems   Review of Systems  Respiratory:  Positive for shortness of breath.   Skin:  Positive for rash.    Physical Exam Updated Vital Signs BP (!) 167/76   Pulse 79   Temp 97.8 F (36.6 C) (Oral)   Resp 14   Ht '5\' 6"'$  (1.676 m)   Wt 72.6 kg   SpO2 96%   BMI 25.82 kg/m  Physical Exam Vitals and nursing note reviewed.  Constitutional:      Appearance: Normal appearance.  HENT:     Head: Normocephalic and atraumatic.      Nose: Nose normal.     Mouth/Throat:     Mouth: Mucous membranes are moist.  Eyes:     Extraocular Movements: Extraocular movements intact.     Conjunctiva/sclera: Conjunctivae normal.     Pupils: Pupils are equal, round, and reactive to light.  Cardiovascular:     Rate and Rhythm: Normal rate and regular rhythm.  Pulmonary:     Effort: Pulmonary effort is normal.     Breath sounds: Normal breath sounds.  Abdominal:     General: Abdomen is flat. Bowel sounds are normal.     Palpations: Abdomen is soft.  Musculoskeletal:        General: Normal range of motion.     Cervical back: Normal range of motion and neck supple.     Comments: 5/5 strength in BLE  Skin:    General: Skin is warm.     Capillary Refill: Capillary refill takes less than 2 seconds.     Comments: Mildly erythematous areas of skin breakdown/irritation present in peri area and buttocks--see pictures below  Neurological:     General: No focal deficit present.     Mental Status: She is alert.  Psychiatric:        Mood and Affect: Mood is anxious.        Thought Content: Thought content normal.        ED Results / Procedures / Treatments   Labs (all labs ordered are listed, but only abnormal results are displayed) Labs Reviewed  COMPREHENSIVE METABOLIC PANEL - Abnormal; Notable for the following components:      Result Value   Potassium 3.4 (*)    Glucose, Bld 112 (*)    BUN 24 (*)    Creatinine, Ser 1.41 (*)    GFR, Estimated 38 (*)    All other components within normal limits  BRAIN NATRIURETIC PEPTIDE - Abnormal; Notable for the following components:   B Natriuretic Peptide 206.8 (*)    All other components within normal limits  CBC    EKG None  Radiology DG Chest 2 View  Result Date: 06/01/2022 CLINICAL DATA:  Shortness of breath. EXAM: CHEST - 2 VIEW COMPARISON:  High-resolution chest CT 12/12/2021, chest radiograph 12/26/2019 FINDINGS: Again seen interstitial lung disease with diffuse  coarsening and ground-glass. There is progression from 2021 exam, but grossly stable from exam earlier this year. Streaky atelectasis of the left lower lobe. The heart is normal in size. Stable mediastinal contours, aortic atherosclerosis. No pleural effusion or pneumothorax. No acute osseous findings, multiple periarticular soft tissue calcifications about the right shoulder. IMPRESSION: Diffuse interstitial lung disease, grossly stable from CT earlier this year.  Streaky atelectasis in the left lower lobe. Electronically Signed   By: Keith Rake M.D.   On: 06/01/2022 20:38    Procedures Procedures   Medications Ordered in ED Medications  morphine (PF) 4 MG/ML injection 4 mg (4 mg Intramuscular Not Given 06/01/22 2201)  ondansetron (ZOFRAN-ODT) disintegrating tablet 4 mg (4 mg Oral Given 06/01/22 2201)    ED Course/ Medical Decision Making/ A&P  This patient presents to the ED for concern of rash on buttocks/peri area, SOB, and need for social work, this involves an extensive number of treatment options, and is a complaint that carries with it a high risk of complications and morbidity.    Co morbidities that complicate the patient evaluation  Pulmonary fibrosis, CAD, psoriasis   Additional history obtained:  External records from outside source obtained and reviewed including Dr. Rolly Salter telephone visit with the pt on 9/21   Lab Tests:  I Ordered, and personally interpreted labs.  The pertinent results include:  absence of leukocytosis, BNP of 206.8 which is similar to past BNPs   Imaging Studies ordered:  I ordered imaging studies including chest xray  I independently visualized and interpreted imaging which showed diffuse interstitial disease  I agree with the radiologist interpretation   Cardiac Monitoring: / EKG:  The patient was maintained on a cardiac monitor.  I personally viewed and interpreted the cardiac monitored which showed an underlying rhythm of: normal sinus  rhythm   Consultations Obtained:  I requested consultation with the Dr. Darl Householder,  and discussed lab and imaging findings as well as pertinent plan - they recommend: discharge home, w/SW, PT, RT, RN, home health care eval. Additionally to discharge on antibiotics and diflucan for rash.   Problem List / ED Course / Critical interventions / Medication management  I ordered medication including zofran  for nausea  Reevaluation of the patient after these medicines showed that the patient improved I have reviewed the patients home medicines and have made adjustments as needed  Test / Admission - Considered:  Patient is a 76 year old female, history of CAD, pulmonary fibrosis, who presents to the ED secondary to shortness of breath, rash, and social concerns.  Rash is compatible with irritant contact dermatitis likely secondary to urinary incontinence, she and I discussed using a barrier cream such as Boudreax Butt paste.  Additionally discussed importance of keeping area dry and clean at all times.  Instructed to apply paste in thin layer, and to let it dry before putting clothes on.  Given the nature of her rash and especially since it is in her periarea, she was sent home with Keflex, and Diflucan to help clear it.Discussed shortness of breath, had nursing ambulate patient, patient walked well, shortness of breath has been going on for 3 months, worse with exertion.  She has not been using her albuterol and has continued to smoke.  Chest x-ray shows stable pulmonary disease, and EKG shows no ST elevations or depressions.  Discussed with patient need for follow-up with PCP, and likely worsening of her pulmonary fibrosis.  At this time she is not requiring oxygen, and is sitting in the ER without any oxygen, satting above 95%.  Unfortunately social work was not available at the location she was at, and she was sent referrals for outpatient social work, PT, RT, and home nursing.  She was agreeable with the plan  of care, and return symptoms were emphasized.  Final Clinical Impression(s) / ED Diagnoses Final diagnoses:  Irritant contact dermatitis due to fecal, urinary or dual incontinence  Shortness of breath  Postinflammatory pulmonary fibrosis [facility])    Rx / DC Orders ED Discharge Orders          [place]        06/01/22 2145    Face-to-face encounter (required for Medicare/Medicaid patients)       Comments: I Wandra Arthurs certify that this patient is under my care and that I, or a nurse practitioner or physician's assistant working with me, had a face-to-face encounter that meets the physician face-to-face encounter requirements with this patient on 06/01/2022. The encounter with the patient was in whole, or in part for the following medical condition(s) which is the primary reason for home health care (List medical condition): pulmonary fibrosis, heart failure, further orders from PCP   06/01/22 2145    fluconazole (DIFLUCAN) 150 MG tablet  Daily        06/01/22 2234    cephALEXin (KEFLEX) 500 MG capsule  4 times daily        06/01/22 2234              Christipher Rieger, [place], PA 06/02/22 0211    Drenda Freeze, MD 06/06/22 1450

## 2022-06-01 NOTE — Discharge Instructions (Addendum)
Please follow-up with your PCP for further evaluation.  Take your Keflex, and fluconazole as prescribed.  For your buttocks pain, you can buy Boudreaux's Butt paste and apply a thin layer over the area, and make sure that it dries.  Keep yourself dry at all times.  Change her underwear frequently to make sure there is no urine is on your skin for prolonged periods of time.  Redness, swelling of the area please return to the ER.  You have also been referred to have social work, physical therapy, respiratory therapy, and nursing to help you, you will receive a call to have this further evaluation.  If you feel like your symptoms are worsening please return to the ER immediately.

## 2022-06-01 NOTE — Telephone Encounter (Signed)
I am unsure of what to do with this information at this time, anything to advise?

## 2022-06-01 NOTE — Telephone Encounter (Signed)
Caller name: [place]  On DPR? :yes/no: Yes  Call back number: [phone]  Provider they see:  Carlota Raspberry  Reason for call: calling to let Dr. Carlota Raspberry know that going [facility].  Refused to tell me  why she was going. States that Dr. Carlota Raspberry was going to call them to let them know her situation.

## 2022-06-01 NOTE — ED Triage Notes (Signed)
Pt reports possible pressure wound on her bottom from sitting so much and wearing adult diapers.  Also c/o recurrent shob that worsened 1 week ago. Hx of pulmonary fibrosis and emphysema. States pcp wants her checked for HF. Denies chest pain. Pt in NAD, breathing normal, and speaking in full sentences.  Per PCP note, pt also needs social work eval due to unsafe living situation.

## 2022-06-01 NOTE — Progress Notes (Addendum)
Virtual Visit via audio   I connected with [name] on 06/01/22 at 11:34 AM by phone - verified that I am speaking with the correct person using two identifiers.  Patient location: home, by self.  My location: office - [place].    10 min initial chart review. 11:35 AM  Not on video - called patient. Was not able to connect. Repeat link sent. She was not able to connect to video - changed to audio call.   I discussed the limitations, risks, security and privacy concerns of performing an evaluation and management service by telephone and the availability of in person appointments. I also discussed with the patient that there may be a patient responsible charge related to this service. The patient expressed understanding and agreed to proceed, consent obtained  Chief complaint:  Chief Complaint  Patient presents with   Rash    Pt states rash all over her body and she cant sleep, pt states the rash is little bumps with pus, pt states they itch, also does sting and burn , pt also states she is having a hard time breathing when she walks   Depression    PHQ9 - 16    History of Present Illness: [name] is a 76 y.o. female Video visit for above. Last visit with me in April.  Delay in follow-up at that time.  Suspected pressure injury of her left buttock, stage I at that visit.  Decreased mobility likely contributor.  Wound care was discussed and advised follow-up within a few weeks, as well as to review her other medications.  Unfortunately has not been seen since that April visit.  She is followed by psychiatry, I did receive a message recently about some concerns of medication adherence as well as her care at home.  Reportedly unable to swallow meds and stopped fo rat least 3 weeks- including xarelto, cymbalta and klonopin. Previously followed by chronic care management but note reviewed from June, case was closed as unable to maintain contact.  Rash: Diffuse rash,  initially was on buttocks. Now has spread to legs, and some on elbows. red rash. Dry and flaky at times that peels.  Some clumped together red dots. small bumps with pus on arms. Some rash in genital area. No mouth lesions. Denies ulcers. Some itching, stinging and burning. Tx: vaseline.   Has been wearing adult diapers for urinary incontinence, now wearing pad and underwear. Ongoing urinary incontinence.  No fever.   Dyspnea with ambulation: Known history of emphysema, chronic respiratory failure, peripheral vascular disease, prior hospitalization for septic shock, acute on chronic diastolic CHF and idiopathic pulmonary fibrosis. Some increased dyspnea lately with walking-  to kitchen or bathroom. Unknown timing of worsening.  Has to take break with any activity. Slight shortness of breath at rest at times.  Denies chest pains.  Still smoking. Cognitive skills declining. Worry about climbing steps up to bedroom. Confused a lot.  Has discussed with son who flies in every 6 weeks. Talked about a nursing home - trying to figure it out.  Frozen meals, junk food for diet. Orders take out 5-6 times per week.  Difficulty bathing self. Afraid of falling and out of breath in shower, arm soreness also limiting.  Only taking xarelto few times per week. Limited by nausea. Vomiting at times, less if not taking meds . Last episode 3-4 days ago. Has discussed with GI - Dr. Havery Moros.  Brother in law will transport her to ER.  Positive depression screening Known history of depression.  Treated with Cymbalta previously.  30 mg when discussed in August of last year, nausea at higher dosing.  She has self reduced medications in the past she is followed by therapist  Luan Moore, psychiatrist Dr. Clovis Pu. Worse mental state.     06/01/2022   11:08 AM 12/14/2021    4:00 PM 05/11/2021    4:04 PM 03/23/2021    3:08 PM 02/23/2020    4:21 PM  Depression screen PHQ 2/9  Decreased Interest _0 0  Down,  Depressed, Hopeless _1 PHQ - 2 Score _2 Altered sleeping _3 Tired, decreased energy _4 Change in appetite _5 0 3  Feeling bad or failure about yourself  _6 0 1  Trouble concentrating 0 _7 Moving slowly or fidgety/restless 0 2 0 0 0  Suicidal thoughts 0 0 0 0 1  PHQ-9 Score _8 Difficult doing work/chores    Somewhat difficult        Patient Active Problem List   Diagnosis Date Noted   Centrilobular emphysema ([place]) 10/19/2020   Chronic respiratory failure with hypoxia (HCC) 12/26/2019   Shortness of breath 12/26/2019   Bilateral pulmonary infiltrates on CXR    Complicated UTI (urinary tract infection) 11/11/2019   AKI (acute kidney injury) ([place]) 11/11/2019   Hyponatremia 11/11/2019   Elevated LFTs 11/11/2019   Sepsis ([place]) 11/11/2019   Wound infection 11/07/2018   Postoperative pain    Sleep disturbance    Tobacco abuse    Acute blood loss anemia    Hypoalbuminemia due to protein-calorie malnutrition ([place])    Debility 10/24/2018   Pre-operative cardiovascular examination 09/26/2018   HLD (hyperlipidemia) 09/26/2018   GAD (generalized anxiety disorder) 08/07/2018   Major depressive disorder, single episode 08/07/2018   Appendiceal abscess 07/08/2018   PVD (peripheral vascular disease) ([place]) 03/15/2018   Atherosclerosis of native artery of left lower extremity with intermittent claudication ([place]) 03/15/2018   Chronic left-sided low back pain with left-sided sciatica 12/25/2017   Facial droop    History of CVA (cerebrovascular accident) 10/23/2017   Critical lower limb ischemia ([place]) 11/08/2016   Smoker 11/08/2016   Postinflammatory pulmonary fibrosis ([place]) 02/14/2016   Cigarette smoker 12/24/2015   Hypothyroid ? 01/16/2014   Mild cognitive impairment 01/16/2014   Meningioma ([place]) 10/21/2013   Chest pain 10/20/2013   Medicare annual wellness visit, subsequent 06/16/2013   Dizziness and giddiness 06/16/2013   RLS  (restless legs syndrome) 04/29/2012   Abdominal pain 12/27/2010   DEGENERATIVE JOINT DISEASE 09/23/2010   CAROTID ARTERY DISEASE 08/15/2010   CHEST PAIN 08/15/2010   HEADACHE 12/02/2009   BACK PAIN 11/22/2009   CONSTIPATION, CHRONIC 01/29/2008   NAUSEA 01/28/2008   DEPRESSION 03/08/2007   HTN (hypertension) 03/08/2007   Past Medical History:  Diagnosis Date   AAA (abdominal aortic aneurysm) ([place])    3.1 cm 07/08/18, 3 year follow-up recommended; possible 3 cm AAA by aortogram 09/13/18   Anemia    PMH   Appendicitis with abscess    07/08/18, s/p perc drain; resolved 07/30/18 by CT   Arthritis    Bipolar disorder ([place])    Cerebrovascular disease    intra and extracranial vascular dx per MRI 4/11, neurology rec strict CVRF control   Colonic inertia    Constipation  chronic;severe   Coronary artery disease    Depression    Duodenitis    EKG abnormalities    changes, stress test neg (false EKG changes)   Gastritis    GERD (gastroesophageal reflux disease)    Hypertension    Hypothyroid 01/16/2014   Meningioma ([place]) 10/21/2013   PAD (peripheral artery disease) ([place])    Psoriasis    sees derm   Stroke [facility])    Wears dentures    Wears glasses    Past Surgical History:  Procedure Laterality Date   ABDOMINAL AORTOGRAM W/LOWER EXTREMITY N/A 09/13/2018   Procedure: ABDOMINAL AORTOGRAM W/LOWER EXTREMITY;  Surgeon: Elam Dutch, MD;  Location: [place] [facility];  Service: Cardiovascular;  Laterality: N/A;   arthroscopy  04/2010   Right knee   CATARACT EXTRACTION W/ INTRAOCULAR LENS  IMPLANT, BILATERAL     COLONOSCOPY     FEMORAL-POPLITEAL BYPASS GRAFT  10/22/2018   FEMORAL-POPLITEAL BYPASS GRAFT Left 10/22/2018   Procedure: LEFT FEMORAL TO BELOW THE KNEE POPLITEAL ARTERY BYPASS GRAFT;  Surgeon: Elam Dutch, MD;  Location: [place];  Service: Vascular;  Laterality: Left;   I & D EXTREMITY Left 11/08/2018   Procedure: IRRIGATION AND DEBRIDEMENT EXTREMITY Left Leg;  Surgeon:  Elam Dutch, MD;  Location: [place];  Service: Vascular;  Laterality: Left;   IR RADIOLOGIST EVAL & MGMT  07/30/2018   MULTIPLE TOOTH EXTRACTIONS     TUBAL LIGATION     No Known Allergies Prior to Admission medications   Medication Sig Start Date End Date Taking? Authorizing Provider  clonazePAM (KLONOPIN) 0.5 MG tablet TAKE 1 TABLET(0.5 MG) BY MOUTH TWICE DAILY AS NEEDED FOR ANXIETY 11/03/21  Yes Cottle, Billey Co., MD  famotidine (PEPCID) 20 MG tablet One at bedtime 02/14/16  Yes Tanda Rockers, MD  Magnesium Hydroxide (DULCOLAX PO) Take by mouth as needed.   Yes [provider]  Melatonin 10 MG TABS Take 20 mg by mouth at bedtime.   Yes [provider]  rOPINIRole (REQUIP) 2 MG tablet Take 1 tablet (2 mg total) by mouth at bedtime. 11/03/21  Yes Cottle, Billey Co., MD  Senna EXTR Take by mouth as needed.   Yes [provider]  XARELTO 20 MG TABS tablet TAKE 1 TABLET(20 MG) BY MOUTH DAILY WITH SUPPER 10/31/21  Yes [name], MD  albuterol (VENTOLIN HFA) 108 (90 Base) MCG/ACT inhaler Inhale 1-2 puffs into the lungs every 6 (six) hours as needed for wheezing or shortness of breath. Patient not taking: Reported on 06/01/2022 10/19/20   Martyn Ehrich, NP  DULoxetine (CYMBALTA) 30 MG capsule Take 1 capsule (30 mg total) by mouth 2 (two) times daily. Patient not taking: Reported on 06/01/2022 11/03/21   Purnell Shoemaker., MD  lamoTRIgine (LAMICTAL) 25 MG tablet Take 3 tablets (75 mg total) by mouth daily. Patient not taking: Reported on 06/01/2022 11/03/21   Cottle, Billey Co., MD  linaclotide [facility]) 145 MCG CAPS capsule Take 1 capsule (145 mcg total) by mouth 2 (two) times daily. Patient not taking: Reported on 06/01/2022 07/01/21   Armbruster, Carlota Raspberry, MD   Social History   Socioeconomic History   Marital status: Widowed    Spouse name: Not on file   Number of children: 1   Years of education: Not on file   Highest education level: Not on file   Occupational History   Occupation: retired  Tobacco Use   Smoking status: Every Day  Packs/day: 0.50    Years: 60.00    Total pack years: 30.00    Types: Cigarettes    Last attempt to quit: 11/10/2019    Years since quitting: 2.5   Smokeless tobacco: Never  Vaping Use   Vaping Use: Former  Substance and Sexual Activity   Alcohol use: Yes    Alcohol/week: 0.0 standard drinks of alcohol    Comment: yes on occassion   Drug use: No   Sexual activity: Not Currently  Other Topics Concern   Not on file  Social History Narrative   Brother in law Mr Rexford Maus (one of my patients)   Lives w/ husband       Social Determinants of Health   Financial Resource Strain: Low Risk  (03/23/2021)   Overall Financial Resource Strain (CARDIA)    Difficulty of Paying Living Expenses: Not hard at all  Food Insecurity: No Food Insecurity (03/23/2021)   Hunger Vital Sign    Worried About Running Out of Food in the Last Year: Never true    [place] in the Last Year: Never true  Transportation Needs: No Transportation Needs (03/23/2021)   PRAPARE - Hydrologist (Medical): No    Lack of Transportation (Non-Medical): No  Physical Activity: Inactive (03/23/2021)   Exercise Vital Sign    Days of Exercise per Week: 0 days    Minutes of Exercise per Session: 0 min  Stress: Not on file  Social Connections: Unknown (10/24/2018)   Social Connection and Isolation Panel [NHANES]    Frequency of Communication with Friends and Family: Patient refused    Frequency of Social Gatherings with Friends and Family: Patient refused    Attends Religious Services: Patient refused    Active Member of Clubs or Organizations: Patient refused    Attends Archivist Meetings: Patient refused    Marital Status: Patient refused  Intimate Partner Violence: Unknown (10/24/2018)   Humiliation, Afraid, Rape, and Kick questionnaire    Fear of Current or Ex-Partner: Patient refused     Emotionally Abused: Patient refused    Physically Abused: Patient refused    Sexually Abused: Patient refused    Observations/Objective: There were no vitals filed for this visit. Coherent responses but speaking in shortened sentences.  No cough or audible wheeze/stridor noted on call.  All questions were answered with understanding of plan expressed.   25 minutes spent during call, with additional chart review, as above.   Assessment and Plan: Shortness of breath  Idiopathic pulmonary fibrosis (HCC)  Nausea and vomiting, unspecified vomiting type  Confusion  Rash  Nonadherence to medication  Dyspnea possibly multifactorial with her pulmonary fibrosis, emphysema, CHF.[place] Kitchen  However that has been progressive even with minimal activity.  In person evaluation recommended, specifically through ER as potential for hospital admission.  Also will need to look at placement, assistance options as listed below.  Option of EMS transport, but she will have her brother-in-law drive her by private vehicle.  Additionally with rashes above, multiple different skin issues likely including previous psoriasis, new rash with pustular rash noted in certain areas, and a genital rash as well as rash on buttocks.  Could have component of folliculitis with arm symptoms, but again unable to see through phone call.  Fungal rash possible in genital area with history of incontinence and use of incontinence pads.  Cellulitis also possible.  Concern for possible pressure wounds from last visit, which may also be present and  progressive.  In person eval as above through ER.  Medication nonadherence, reports due to nausea.  Has met with gastroenterology previously, no recent eval.  May need medication adjustment for discussion with gastroenterology regarding nausea treatment to improve medication adherence.  Finally home living situation is questionable in regards to safety at this time.  She is having difficulty  getting to her bedroom, does not feel safe or steady while taking showers, admits to difficulty with nutritious diet.  Given her issues above, may need skilled nursing facility temporarily, possible assisted living or at the minimum home health assistance/nurse.  Initial eval through ER as potentially may need hospitalization, then recommend social work evaluation to determine disposition options.     Follow Up Instructions: Depending on ER visit.   I discussed the assessment and treatment plan with the patient. The patient was provided an opportunity to ask questions and all were answered. The patient agreed with the plan and demonstrated an understanding of the instructions.   The patient was advised to call back or seek an in-person evaluation if the symptoms worsen or if the condition fails to improve as anticipated.   [name], MD

## 2022-06-01 NOTE — Telephone Encounter (Signed)
Okay.  Note has been completed, staff at the ER should be able to read notes and concerns.  Thanks.

## 2022-06-06 ENCOUNTER — Telehealth: Payer: Self-pay | Admitting: Family Medicine

## 2022-06-06 NOTE — Telephone Encounter (Signed)
Caller name: [name]  On DPR? :yes/no: Yes  Call back number: [phone]  Provider they see: Carlota Raspberry   Reason for call: calling b/c went to ED 06/01/22 for dermatitis on rear end; states that when she went to view her visit on her mychart, she was surprised and upset and shocked to see that there were pictures of her private area on there. I expressed empathy, concern and understanding of pt's feelings and assured her that I would let Dr. Carlota Raspberry know of her distress and concern over this situation.

## 2022-06-08 NOTE — Telephone Encounter (Signed)
Pt notes she was told she was getting social worker referral in her notes but there is no active referral.  Also requesting a referral to audiology as she notes significantly reduced hearing in her Lt ear recent 06/01/22 visit hoping you can do it from this visit.   Pt is aware she should expect you call

## 2022-06-08 NOTE — Telephone Encounter (Signed)
I am sorry to hear this. I will reach out to her to discuss further. Please let her know I will call today or tomorrow. Can we find out as well if she has received contact from social work, PT, OT as noted in [place]?

## 2022-06-09 MED ORDER — ALBUTEROL SULFATE HFA 108 (90 BASE) MCG/ACT IN AERS: 1.0000 | INHALATION_SPRAY | Freq: Four times a day (QID) | RESPIRATORY_TRACT | 2 refills | Status: DC | PRN

## 2022-06-09 NOTE — Addendum Note (Signed)
Addended by: Merri Ray R on: 06/09/2022 05:02 PM   Modules accepted: Orders

## 2022-06-09 NOTE — Telephone Encounter (Addendum)
I called patient.  Noted ER documentation including the photo of the genital rash. She did note that the provider asked if she could take a photo of the area but did not realize it would be in the chart.  I did empathize with her that this is a sensitive area, and can understand the concern and embarrassment, but tried to reassure her that this photo was provided in order to document the rash and provide information if needed for other providers in case it were to worsen or change, and that although visible through her MyChart, this is still part of her confidential medical record.  Rash treated with Keflex, Diflucan for candidal infection with possible superimposed cellulitis.  Barrier cream also recommended from ER.  Bought barrier cream yesterday, has started meds but inconsistent dosing - discussed adherence to meds. Will have her seen in person next week with ER precautions if worse.  At her visit with me on the 21st we did discuss her dyspnea, with pulmonary fibrosis, emphysema, CHF, potentially worse with incomplete adherence to medications as noted in ER.  Chest x-ray with chronic pulmonary disease, -diffuse interstitial lung disease, grossly stable and O2 sat above 95% without oxygen need in the ER.  We will need her to follow-up with pulmonary, recommend she call Dr. Gearldine Bienenstock office for follow-up soon.  Albuterol refilled as old rx.   In regards to the nausea and difficulty with medications, has been treated by GI previously.  Appears the last recommendation from GI was in October last year, recommended she call Dr. Doyne Keel office to discuss concerns and follow-up.  In regards to home living situation and safety, I do not see social work, PT, OT eval ordered and will place that referral. Has not received call yet. If not called by next week will reorder.   Trouble with hearing out of R ear for awhile  - no recent changes, will discuss in exam next week.   ER precautions if any worsening  symptoms over the weekend.

## 2022-06-12 NOTE — Telephone Encounter (Signed)
Pt is scheduled for 06/15/22.

## 2022-06-12 NOTE — Telephone Encounter (Signed)
Called pt no answer, LM to call back to schedule appt.

## 2022-06-15 ENCOUNTER — Encounter: Payer: Self-pay | Admitting: Family Medicine

## 2022-06-15 ENCOUNTER — Ambulatory Visit (INDEPENDENT_AMBULATORY_CARE_PROVIDER_SITE_OTHER): Payer: Medicare Other | Admitting: Family Medicine

## 2022-06-15 VITALS — BP 138/82 | HR 63 | Temp 97.9°F | Ht 66.0 in

## 2022-06-15 DIAGNOSIS — E876 Hypokalemia: Secondary | ICD-10-CM | POA: Diagnosis not present

## 2022-06-15 DIAGNOSIS — R21 Rash and other nonspecific skin eruption: Secondary | ICD-10-CM | POA: Diagnosis not present

## 2022-06-15 DIAGNOSIS — Z91148 Patient's other noncompliance with medication regimen for other reason: Secondary | ICD-10-CM | POA: Diagnosis not present

## 2022-06-15 DIAGNOSIS — L409 Psoriasis, unspecified: Secondary | ICD-10-CM | POA: Diagnosis not present

## 2022-06-15 DIAGNOSIS — R7989 Other specified abnormal findings of blood chemistry: Secondary | ICD-10-CM | POA: Diagnosis not present

## 2022-06-15 DIAGNOSIS — J84112 Idiopathic pulmonary fibrosis: Secondary | ICD-10-CM | POA: Diagnosis not present

## 2022-06-15 DIAGNOSIS — R112 Nausea with vomiting, unspecified: Secondary | ICD-10-CM | POA: Diagnosis not present

## 2022-06-15 DIAGNOSIS — L609 Nail disorder, unspecified: Secondary | ICD-10-CM

## 2022-06-15 DIAGNOSIS — H6123 Impacted cerumen, bilateral: Secondary | ICD-10-CM

## 2022-06-15 MED ORDER — TRIAMCINOLONE ACETONIDE 0.1 % EX CREA: 1.0000 | TOPICAL_CREAM | Freq: Two times a day (BID) | CUTANEOUS | 1 refills | Status: DC

## 2022-06-15 NOTE — Progress Notes (Signed)
Subjective:  Patient ID: [name], female    DOB: [birth year]  Age: 76 y.o. MRN: [id number]  CC:  Chief Complaint  Patient presents with   Hospitalization Follow-up    SOB - Pt states she hasn't used  the inhaler for a long time , and pt hasn't spoke to Dr. Lorie Apley for a while  Social Work - Pt states no one has contacted her about P/T or O/T Genital Rash - Pt states its horrible and she hasn't taken the prescribed keflex or diflunac     Depression    Phq9 - 15    HPI [name] presents for  ER follow-up with multiple concerns.  Genital rash See ER notes from evaluation on September 21 follow-up phone call.  Possible irritant contact dermatitis, secondary infection from contact Derm versus fungal infection.  Treated with Keflex, Diflucan, barrier cream. She didn't think was infection. Only took keflex for 2 days. Stopped as made her nauseated. Did take diflucan  Rash looks worse. Spreading to legs and arms - slight rash in arms and legs in ER, but now worse. Spotted areas.  Itchy. Treated with cream from [employer] - to arms, legs and genitals once per day after cleansing with soap and water. Some urinary incontinence at times. Wearing incontinence pads.   No fevers.  History of psoriasis.using different otc cream to psoriasis areas. No recent dermatology.   Hypokalemia, elevated creatinine. - noted at ER visit - repeat today.  Lab Results  Component Value Date   K 3.4 (L) 06/01/2022   Lab Results  Component Value Date   CREATININE 1.41 (H) 06/01/2022   Difficulty cutting toenails, requests podiatry eval.    Dyspnea History of pulmonary fibrosis, emphysema, CHF.  Incomplete medication adherence previously.  She did have chest x-ray in the ER with chronic pulmonary disease, diffuse interstitial disease but grossly stable and O2 sat was above 95% without need for oxygen.  Plan for follow-up with her pulmonologist and I advised her to call her pulmonary specialist for  follow-up soon.  I did refill her albuterol as she had an old prescription at home. Did not fill rx for albuterol. No change in breathing.  Has continued to smoke. She has not called her pulmonologist for follow-up. Does not need number. Declines our help in scheduling appointment.   Disposition/Home safety Discussed at her September 21 visit.  Living at home by herself, reported episodic confusion, looking into nursing home or assisted living facility.  Was worried about climbing steps up to her bedroom.  Difficulty with bathing herself and afraid of falling as well as feeling out of breath in the shower.  ER notes indicated that a Education officer, museum would be contacting patient to evaluate home living situation.  She has not received any calls. No grab bars in shower.  Plans to discuss options with her son.    Chronic nausea See prior visits, has discussed with her gastroenterologist, has limited her ability to take medications.  No recent visit with gastroenterology, stressed importance of call to GI for follow up - she has not called.  Pepcid helps some then can take some of her pills with food. Last GI note in 06/2021. Chronic constipation treated with Linzess. No relief with Zofran. Pepcid daily. Some constipation at times even with taking Linzess.   Hearing difficulty: Feels blocked on R side more than left. Some ringing in the ears. No pain, but trouble hearing. Refused to have me try to remove cerumen  Longstanding dizziness for months. No recent changes. No focal weakness, slurred speech or facial droop.    Depression: Positive depression screening noted, she is followed by therapist, Luan Moore, psychiatrist Dr. Clovis Pu.  No suicidal ideation.     06/15/2022    4:01 PM 06/01/2022   11:08 AM 12/14/2021    4:00 PM 05/11/2021    4:04 PM 03/23/2021    3:08 PM  Depression screen PHQ 2/9  Decreased Interest '3 3 2 2 1  '$ Down, Depressed, Hopeless '3 3 2 2 1  '$ PHQ - 2 Score '6 6 4 4 2  '$ Altered  sleeping '1 3 2 2 1  '$ Tired, decreased energy '3 3 1 2 3  '$ Change in appetite '2 3 1 1 '$ 0  Feeling bad or failure about yourself  '1 1 2 2 '$ 0  Trouble concentrating 1 0 '1 1 1  '$ Moving slowly or fidgety/restless 1 0 2 0 0  Suicidal thoughts 0 0 0 0 0  PHQ-9 Score '15 16 13 12 7  '$ Difficult doing work/chores     Somewhat difficult      History Patient Active Problem List   Diagnosis Date Noted   Centrilobular emphysema ([place]) 10/19/2020   Chronic respiratory failure with hypoxia (HCC) 12/26/2019   Shortness of breath 12/26/2019   Bilateral pulmonary infiltrates on CXR    Complicated UTI (urinary tract infection) 11/11/2019   AKI (acute kidney injury) ([place]) 11/11/2019   Hyponatremia 11/11/2019   Elevated LFTs 11/11/2019   Sepsis ([place]) 11/11/2019   Wound infection 11/07/2018   Postoperative pain    Sleep disturbance    Tobacco abuse    Acute blood loss anemia    Hypoalbuminemia due to protein-calorie malnutrition ([place])    Debility 10/24/2018   Pre-operative cardiovascular examination 09/26/2018   HLD (hyperlipidemia) 09/26/2018   GAD (generalized anxiety disorder) 08/07/2018   Major depressive disorder, single episode 08/07/2018   Appendiceal abscess 07/08/2018   PVD (peripheral vascular disease) ([place]) 03/15/2018   Atherosclerosis of native artery of left lower extremity with intermittent claudication ([place]) 03/15/2018   Chronic left-sided low back pain with left-sided sciatica 12/25/2017   Facial droop    History of CVA (cerebrovascular accident) 10/23/2017   Critical lower limb ischemia ([place]) 11/08/2016   Smoker 11/08/2016   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 01/16/2014   Mild cognitive impairment 01/16/2014   Meningioma ([place]) 10/21/2013   Chest pain 10/20/2013   Medicare annual wellness visit, subsequent 06/16/2013   Dizziness and giddiness 06/16/2013   RLS (restless legs syndrome) 04/29/2012   Abdominal pain  12/27/2010   DEGENERATIVE JOINT DISEASE 09/23/2010   CAROTID ARTERY DISEASE 08/15/2010   CHEST PAIN 08/15/2010   HEADACHE 12/02/2009   BACK PAIN 11/22/2009   CONSTIPATION, CHRONIC 01/29/2008   NAUSEA 01/28/2008   DEPRESSION 03/08/2007   HTN (hypertension) 03/08/2007   Past Medical History:  Diagnosis Date   AAA (abdominal aortic aneurysm) ([place])    3.1 cm 07/08/18, 3 year follow-up recommended; possible 3 cm AAA by aortogram 09/13/18   Anemia    PMH   Appendicitis with abscess    07/08/18, s/p perc drain; resolved 07/30/18 by CT   Arthritis    Bipolar disorder ([place])    Cerebrovascular disease    intra and extracranial vascular dx per MRI 4/11, neurology rec strict CVRF control   Colonic inertia    Constipation    chronic;severe   Coronary artery disease    Depression  Duodenitis    EKG abnormalities    changes, stress test neg (false EKG changes)   Gastritis    GERD (gastroesophageal reflux disease)    Hypertension    Hypothyroid 01/16/2014   Meningioma ([place]) 10/21/2013   PAD (peripheral artery disease) ([place])    Psoriasis    sees derm   Stroke [facility])    Wears dentures    Wears glasses    Past Surgical History:  Procedure Laterality Date   ABDOMINAL AORTOGRAM W/LOWER EXTREMITY N/A 09/13/2018   Procedure: ABDOMINAL AORTOGRAM W/LOWER EXTREMITY;  Surgeon: Elam Dutch, MD;  Location: [facility];  Service: Cardiovascular;  Laterality: N/A;   arthroscopy  04/2010   Right knee   CATARACT EXTRACTION W/ INTRAOCULAR LENS  IMPLANT, BILATERAL     COLONOSCOPY     FEMORAL-POPLITEAL BYPASS GRAFT  10/22/2018   FEMORAL-POPLITEAL BYPASS GRAFT Left 10/22/2018   Procedure: LEFT FEMORAL TO BELOW THE KNEE POPLITEAL ARTERY BYPASS GRAFT;  Surgeon: Elam Dutch, MD;  Location: [facility];  Service: Vascular;  Laterality: Left;   I & D EXTREMITY Left 11/08/2018   Procedure: IRRIGATION AND DEBRIDEMENT EXTREMITY Left Leg;  Surgeon: Elam Dutch, MD;  Location: [facility];  Service:  Vascular;  Laterality: Left;   IR RADIOLOGIST EVAL & MGMT  07/30/2018   MULTIPLE TOOTH EXTRACTIONS     TUBAL LIGATION     No Known Allergies Prior to Admission medications   Medication Sig Start Date End Date Taking? Authorizing Provider  albuterol (VENTOLIN HFA) 108 (90 Base) MCG/ACT inhaler Inhale 1-2 puffs into the lungs every 6 (six) hours as needed for wheezing or shortness of breath. 06/09/22  Yes [name], MD  clonazePAM (KLONOPIN) 0.5 MG tablet TAKE 1 TABLET(0.5 MG) BY MOUTH TWICE DAILY AS NEEDED FOR ANXIETY 11/03/21  Yes Cottle, Billey Co., MD  DULoxetine (CYMBALTA) 30 MG capsule Take 1 capsule (30 mg total) by mouth 2 (two) times daily. 11/03/21  Yes Cottle, Billey Co., MD  famotidine (PEPCID) 20 MG tablet One at bedtime 02/14/16  Yes Tanda Rockers, MD  Magnesium Hydroxide (DULCOLAX PO) Take by mouth as needed.   Yes [provider]  Melatonin 10 MG TABS Take 20 mg by mouth at bedtime.   Yes [provider]  rOPINIRole (REQUIP) 2 MG tablet Take 1 tablet (2 mg total) by mouth at bedtime. 11/03/21  Yes Cottle, Billey Co., MD  XARELTO 20 MG TABS tablet TAKE 1 TABLET(20 MG) BY MOUTH DAILY WITH SUPPER 10/31/21  Yes [name], MD  cephALEXin (KEFLEX) 500 MG capsule Take 1 capsule (500 mg total) by mouth 4 (four) times daily. Patient not taking: Reported on 06/15/2022 06/01/22   Small, Brooke L, PA  lamoTRIgine (LAMICTAL) 25 MG tablet Take 3 tablets (75 mg total) by mouth daily. Patient not taking: Reported on 06/01/2022 11/03/21   Cottle, Billey Co., MD  linaclotide [facility]) 145 MCG CAPS capsule Take 1 capsule (145 mcg total) by mouth 2 (two) times daily. Patient not taking: Reported on 06/01/2022 07/01/21   Armbruster, Carlota Raspberry, MD  [place] EXTR Take by mouth as needed. Patient not taking: Reported on 06/15/2022    [provider]   Social History   Socioeconomic History   Marital status: Widowed    Spouse name: Not on file   Number of children:  1   Years of education: Not on file   Highest education level: Not on file  Occupational History  Occupation: retired  Tobacco Use   Smoking status: Every Day    Packs/day: 0.50    Years: 60.00    Total pack years: 30.00    Types: Cigarettes    Last attempt to quit: 11/10/2019    Years since quitting: 2.5   Smokeless tobacco: Never  Vaping Use   Vaping Use: Former  Substance and Sexual Activity   Alcohol use: Yes    Alcohol/week: 0.0 standard drinks of alcohol    Comment: yes on occassion   Drug use: No   Sexual activity: Not Currently  Other Topics Concern   Not on file  Social History Narrative   Brother in law Mr Rexford Maus (one of my patients)   Lives w/ husband       Social Determinants of Health   Financial Resource Strain: Low Risk  (03/23/2021)   Overall Financial Resource Strain (CARDIA)    Difficulty of Paying Living Expenses: Not hard at all  Food Insecurity: No Food Insecurity (03/23/2021)   Hunger Vital Sign    Worried About Running Out of Food in the Last Year: Never true    Enigma in the Last Year: Never true  Transportation Needs: No Transportation Needs (03/23/2021)   PRAPARE - Hydrologist (Medical): No    Lack of Transportation (Non-Medical): No  Physical Activity: Inactive (03/23/2021)   Exercise Vital Sign    Days of Exercise per Week: 0 days    Minutes of Exercise per Session: 0 min  Stress: Not on file  Social Connections: Unknown (10/24/2018)   Social Connection and Isolation Panel [NHANES]    Frequency of Communication with Friends and Family: Patient refused    Frequency of Social Gatherings with Friends and Family: Patient refused    Attends Religious Services: Patient refused    Active Member of Clubs or Organizations: Patient refused    Attends Archivist Meetings: Patient refused    Marital Status: Patient refused  Intimate Partner Violence: Unknown (10/24/2018)   Humiliation, Afraid,  Rape, and Kick questionnaire    Fear of Current or Ex-Partner: Patient refused    Emotionally Abused: Patient refused    Physically Abused: Patient refused    Sexually Abused: Patient refused    Review of Systems  Per HPI.  Objective:   Vitals:   06/15/22 1602  BP: 138/82  Pulse: 63  Temp: 97.9 F (36.6 C)  SpO2: 100%  Height: '5\' 6"'$  (1.676 m)     Physical Exam Vitals reviewed. Exam conducted with a chaperone present (Chaperone Marcille Blanco present for entire exam.).  Constitutional:      General: She is not in acute distress.    Appearance: Normal appearance. She is well-developed. She is not toxic-appearing or diaphoretic.  HENT:     Head: Normocephalic and atraumatic.     Ears:     Comments: Dark yellow, brown cerumen impacted in bilateral canals.  Dry skin with slight flaking at external canal.  No open wounds, or significant erythema/edema of canal/external tissue. Eyes:     Conjunctiva/sclera: Conjunctivae normal.     Pupils: Pupils are equal, round, and reactive to light.  Neck:     Vascular: No carotid bruit.  Cardiovascular:     Rate and Rhythm: Normal rate and regular rhythm.     Heart sounds: Normal heart sounds.  Pulmonary:     Effort: Pulmonary effort is normal. No respiratory distress.     Comments: Coarse breath sounds  inferiorly.  Abdominal:     Palpations: Abdomen is soft. There is no pulsatile mass.     Tenderness: There is no abdominal tenderness.  Genitourinary:    Exam position: Lithotomy position.     Labia:        Right: Rash present.        Left: Rash present.        Comments: Dark red rash, outlining perineum, and into the intergluteal cleft to upper buttocks, equal both sides, extends to anterior groin/perineum, as well as to mons area with few satellite erythematous circular patches with slight scale at periphery.  No discharge, no surrounding erythema or vascular streaks. Appears similar to photo taken in the ER on September 21 with some more  confluent erythema.  Does not appear to spread further than outlines noted on that photo. Musculoskeletal:     Right lower leg: No edema.     Left lower leg: No edema.  Skin:    General: Skin is warm and dry.     Comments: Scattered round small erythematous patches on arms, legs with some excoriation, some with scale.  Similar.  Upper and lower extremities.  Similar-appearing areas on upper chest.  See genital exam regarding genital rash.  Overgrown toenails with some curvature on all toes.  Neurological:     Mental Status: She is alert and oriented to person, place, and time.  Psychiatric:        Mood and Affect: Mood normal.        Behavior: Behavior normal.     68 minutes spent during visit, including chart review, review of plan from ER, discussion of multiple concerns above including rash, home treatments and plan counseling and assimilation of information, exam, discussion of plan, and chart completion.     Assessment & Plan:  [name] is a 76 y.o. female . Rash of genital area Rash Psoriasis - Plan: triamcinolone cream (KENALOG) 0.1 %  -Diffuse rash on body likely psoriasis, and with minimal change with use of Diflucan, few days of Keflex, I am suspicious of intertriginous/inverse psoriasis in groin, genitals.  Initially we will try triamcinolone 0.1% to lessen risk of cutaneous atrophy from corticosteroids in this area.  Continue same on other areas of arms and legs.  Recheck in the next 1 week.  However if any continued worsening, still have to consider possible secondary cellulitis or infection, and ER precautions were given.  Unable to tolerate Keflex due to nausea, potentially may need admission for other antibiotics.  Depending on improvement over the next 1 week, may need dermatology eval, possible topical calcitriol or topical calcineurin inhibitors for long-term maintenance therapy if this does not appear to be psoriasis.  ER precautions given with understanding  expressed.  Hypokalemia - Plan: Basic metabolic panel  -Repeat labs  Elevated serum creatinine - Plan: Basic metabolic panel  -Slight elevation in ER, repeat labs.  Idiopathic pulmonary fibrosis ([place]) - Plan: Ambulatory referral to [place] sat stable, overall dyspnea stable.  Importance of pulmonary follow-up discussed.  Advised to call   -Limitations in function at home and concern for home safety with current living situation.  Social work order placed as well as potentially nursing or PT/OT depending on home needs.  May need to consider assisted living or nursing facility to provide higher level of care than what she is able to do at home currently.    Nausea and vomiting, unspecified vomiting type - Plan: Ambulatory referral to Home  Health Nonadherence to medication  -Adherence to Pepcid discussed as that appears to have been best treatment previously to help with her nausea based on GI notes, and hopeful this will assist with medication adherence.  Also recommended follow-up with gastroenterology again, especially if persistent constipation on Linzess.  Bilateral impacted cerumen - Plan: Ambulatory referral to ENT  -Declined intervention or attempted lavage, or removal of visible portion of cerumen with curette.  Request ENT eval.  Referral placed.  Option to try Debrox initially.  Topical Aveeno lotion to dry skin on external ear canal if needed.  Nail abnormalities - Plan: Ambulatory referral to Podiatry  -Overgrown nails, with difficulty providing nail care.  Referred to podiatry for evaluation and nail care.  Meds ordered this encounter  Medications   triamcinolone cream (KENALOG) 0.1 %    Sig: Apply 1 Application topically 2 (two) times daily.    Dispense:  80 g    Refill:  1   Patient Instructions  Please call your pulmonologist and gastroenterologist as that is important for your care, including taking meds that affect other portions of your health. Take pepcid  twice per day to help with nausea and taking meds. Call gastroenterology to discuss continued constipation if still having issues on Linzess.   You can try debrox over the counter for earwax, I will refer you to ear specialist.  Aveeno lotion on your fingertip for dry skin on outside of ears. Please follow up to discuss dizziness further if it continues after treatment of earwax  I have ordered a social work evaluation as well as possible physical therapy or nursing evaluation at home.  They should be calling you soon.  Continue to have discussions with your family about assisted living or other living situations that may be safer.  Social worker can also assist with these decisions and options.  Diffuse rash on arms and legs appears to be psoriasis, I am suspicious that rash in groin and buttocks may be psoriasis as well. For now try triamcinolone cream to rashes twice per day and recheck in 1 week in office. Return to the clinic or go to the nearest emergency room if any of your symptoms worsen or new symptoms occur.  I will refer you to podiatry for nail care.       Signed,   Merri Ray, MD [facility] 06/15/22 5:42 PM

## 2022-06-15 NOTE — Patient Instructions (Addendum)
Please call your pulmonologist and gastroenterologist as that is important for your care, including taking meds that affect other portions of your health. Take pepcid twice per day to help with nausea and taking meds. Call gastroenterology to discuss continued constipation if still having issues on Linzess.   You can try debrox over the counter for earwax, I will refer you to ear specialist.  Aveeno lotion on your fingertip for dry skin on outside of ears. Please follow up to discuss dizziness further if it continues after treatment of earwax  I have ordered a social work evaluation as well as possible physical therapy or nursing evaluation at home.  They should be calling you soon.  Continue to have discussions with your family about assisted living or other living situations that may be safer.  Social worker can also assist with these decisions and options.  Diffuse rash on arms and legs appears to be psoriasis, I am suspicious that rash in groin and buttocks may be psoriasis as well. For now try triamcinolone cream to rashes twice per day and recheck in 1 week in office. Return to the clinic or go to the nearest emergency room if any of your symptoms worsen or new symptoms occur.  I will refer you to podiatry for nail care.

## 2022-06-16 ENCOUNTER — Telehealth: Payer: Self-pay | Admitting: Family Medicine

## 2022-06-16 ENCOUNTER — Ambulatory Visit (INDEPENDENT_AMBULATORY_CARE_PROVIDER_SITE_OTHER): Payer: Medicare Other | Admitting: Psychiatry

## 2022-06-16 DIAGNOSIS — F17219 Nicotine dependence, cigarettes, with unspecified nicotine-induced disorders: Secondary | ICD-10-CM | POA: Diagnosis not present

## 2022-06-16 DIAGNOSIS — Z8673 Personal history of transient ischemic attack (TIA), and cerebral infarction without residual deficits: Secondary | ICD-10-CM | POA: Diagnosis not present

## 2022-06-16 DIAGNOSIS — Z62820 Parent-biological child conflict: Secondary | ICD-10-CM

## 2022-06-16 DIAGNOSIS — Z8619 Personal history of other infectious and parasitic diseases: Secondary | ICD-10-CM | POA: Diagnosis not present

## 2022-06-16 DIAGNOSIS — I739 Peripheral vascular disease, unspecified: Secondary | ICD-10-CM

## 2022-06-16 DIAGNOSIS — G4721 Circadian rhythm sleep disorder, delayed sleep phase type: Secondary | ICD-10-CM

## 2022-06-16 DIAGNOSIS — J9611 Chronic respiratory failure with hypoxia: Secondary | ICD-10-CM | POA: Diagnosis not present

## 2022-06-16 DIAGNOSIS — G3184 Mild cognitive impairment, so stated: Secondary | ICD-10-CM

## 2022-06-16 DIAGNOSIS — J84112 Idiopathic pulmonary fibrosis: Secondary | ICD-10-CM

## 2022-06-16 DIAGNOSIS — F331 Major depressive disorder, recurrent, moderate: Secondary | ICD-10-CM

## 2022-06-16 DIAGNOSIS — Z599 Problem related to housing and economic circumstances, unspecified: Secondary | ICD-10-CM

## 2022-06-16 DIAGNOSIS — Z9189 Other specified personal risk factors, not elsewhere classified: Secondary | ICD-10-CM | POA: Diagnosis not present

## 2022-06-16 DIAGNOSIS — F411 Generalized anxiety disorder: Secondary | ICD-10-CM | POA: Diagnosis not present

## 2022-06-16 DIAGNOSIS — Z91148 Patient's other noncompliance with medication regimen for other reason: Secondary | ICD-10-CM | POA: Diagnosis not present

## 2022-06-16 LAB — BASIC METABOLIC PANEL
BUN: 23 mg/dL (ref 6–23)
CO2: 30 mEq/L (ref 19–32)
Calcium: 9.7 mg/dL (ref 8.4–10.5)
Chloride: 98 mEq/L (ref 96–112)
Creatinine, Ser: 1.26 mg/dL — ABNORMAL HIGH (ref 0.40–1.20)
GFR: 40.85 mL/min — ABNORMAL LOW (ref >60.00)
Glucose, Bld: 100 mg/dL — ABNORMAL HIGH (ref 70–99)
Potassium: 4.2 mEq/L (ref 3.5–5.1)
Sodium: 135 mEq/L (ref 135–145)

## 2022-06-16 NOTE — Telephone Encounter (Signed)
error 

## 2022-06-16 NOTE — Progress Notes (Signed)
Psychotherapy Progress Note [facility] Jenny Moore, PhD LP  Patient ID: [name])    MRN: [id number] Therapy format: Individual psychotherapy Date: 06/16/2022      Start: 1:13p     Stop: 2:02p     Time Spent: 49 min Location: Telehealth visit -- I connected with this patient by an approved telecommunication method (audio only), with her informed consent, and verifying identity and patient privacy.  I was located at my office and patient at her home.  As needed, we discussed the limitations, risks, and security and privacy concerns associated with telehealth service, including the availability and conditions which currently govern in-person appointments and the possibility that 3rd-party payment may not be fully guaranteed and she may be responsible for charges.  After she indicated understanding, we proceeded with the session.  Also discussed treatment planning, as needed, including ongoing verbal agreement with the plan, the opportunity to ask and answer all questions, her demonstrated understanding of instructions, and her readiness to call the office should symptoms worsen or she feels she is in a crisis state and needs more immediate and tangible assistance.   Session narrative (presenting needs, interim history, self-report of stressors and symptoms, applications of prior therapy, status changes, and interventions made in session) Last contact 9/19, after which notified PCP of accumulating self-care issues and severe pelvic rash of acute concern which she had been treating with peroxide and Vaseline.  EHR as of today shows ED trip on 9/21 for wound check, admission of incontinence, shortening of breath on exertion, and psychosocial concerns including escalating difficulty caring for herself.  Noted to continue to smoke without use of inhaler, with stable pulmonary disease and unremarkable EKG.  Discharged with antibiotic and instructions for preventive barrier  cream.  Social work unavailable at location, sent home with referrals.  Further notes indicate she is upset about photo evidence appearing in EHR of her aggressive, and privately located, rash.  Primary care notes not seeing social work, OT, or PT ordered for her by ED and intention to place orders himself.  Physical exam yesterday, she had stopped antibiotic d/t nausea and disbelieving she has an infection, rash has spread noticeably, suggestive of psoriasis.  Labs show high creatinine, low potassium, confirms continued smoking and nonadherence to inhaler.  Declines help scheduling pulmonology.  C/o hearing deficit, refuses ear cleaning but accepts referral to ENT.  Depression screening significant (PHQ9 @ 15) with marked low energy, hopelessness, and disinterest.  On phone call, confirms she is staying clean, and drier now, and has taken herself off incontinent briefs now (?), and is using steroid cream.  C/o nausea of unknown origin, but suspected poor nutrition and smoking.  Confirms PCP has made a social work referral and is awaiting a call.  BIL [name], her only tangible support and next-door neighbor, still declines living together and is succumbing himself to neuritis, he says, plus he's had a couple strokes.  Oriented to resources for both him and herself, including DSS/APS, Development worker, community.  Meanwhile, c/o son [name] being verbally abusive to her on his most recent visit, just left about 4 days ago.  Apparently some mention made of how she is suffering, since she says he thinks nothing's wrong with her, she's just playing victim, and he c/o her being upbeat with her Munjor but downer with him.  Says [name] "screams" at her about how horrible she is, and how her DIL and ex-DIL both hate her.  This has been  a familiar complaint at other times, but hard to tell how current it is and how much may be inferred or exaggerated.  Taking it at face value, offered best interpretation that [name] is squirming about how  his own life isn't working out, and now she's starting to feel afraid, reacting to felt pressure to be what he is not sure he can be.  Recommended if needed that she try not to argue the truth or falsehood of attitudes like that but try to respond by asking if they really need to talk about them now; if he insists, "Then what?  What do you want to have happen for talking about them?"  Agrees, but tendency to perseverate about how vilified she is by her son and her DIL [name], also a familiar refrain.  Redirected repeatedly.    Discussed the idea of moving into independent living.  Becoming more clear she cannot manage on her own, the house is too large, and no fantasies of relocating to [place] are likely to pan out, as neither [name] nor her granddaughter there sound knowledgeable, equipped, or willing to take her in.  Discussed levels of assisted living, places with good reputations here, the value of joining a community of elders who look out for each other and have more available help and amenities.    Still being billed ($10K) by an attorney from when she was trying to sell her house, which she has refused.  Asked her to let that issue ride, it's not consequential for future planning, but she also says she is obligated by contract to offer her house (est $500k value) at the then-current price ($410K) to the former bidder.  Admits both she has wanted to die faster for having lost her sister and her husband, and that the way its going is too dismal to face.  Encouraged to use her old determination to work the problem instead of holing up, and if she sees fit, doubt or defy the requirement to offer her home to the former bidder, as this is by now at least 78-year-old news and either unenforceable or distorted.  Getting to supported living is the paramount goal, she can pay for likely time in a community or facility with the proceeds, she does not owe it to her son to preserve an inheritance at the expense of her  own health, and if she does owe something by contract, there is bound to be a fee- or penalty-based way to settle rather than tying up her house and her whole wellbeing in a perceived financial trap.  Therapeutic modalities: Cognitive Behavioral Therapy, Solution-Oriented/Positive Psychology, Ego-Supportive, Psycho-education/Bibliotherapy, and Motivational Interviewing  Mental Status/Observations:  Appearance:   Not assessed     Behavior:  Rationalizing, Resistant, and Blaming  Motor:  Not assessed  Speech/Language:   Clear and Coherent  Affect:  Not assessed  Mood:  depressed  Thought process:  normal  Thought content:    Rumination  Sensory/Perceptual disturbances:    WNL  Orientation:  grossly intact  Attention:  Good    Concentration:  Fair  Memory:  grossly intact  Insight:    Fair  Judgment:   Fair  Impulse Control:  Poor   Risk Assessment: Danger to Self: No Self-injurious Behavior:  risk of self-neglect  and malnutrition Danger to Others: No Physical Aggression / Violence: No Duty to Warn: No Access to Firearms a concern: No  Assessment of progress:  deteriorating -- in need of reassessment  Diagnosis:  ICD-10-CM   1. Major depressive disorder, recurrent episode, moderate (HCC)  F33.1     2. Generalized anxiety disorder  F41.1     3. 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 History of CVA (cerebrovascular accident)  Z86.73     5. Noncompliance with medication regimen  Z91.148     6. 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  G31.84     7. Nicotine dependence, cigarettes, with unspecified nicotine-induced disorders  F17.219     8. History of sepsis  Z86.19     9. PVD (peripheral vascular disease) (HCC)  I73.9     10. Idiopathic pulmonary fibrosis (HCC) (per chart; pt self-describes COPD)  J84.112     11. Chronic respiratory failure with hypoxia (HCC)  J96.11     12. At risk of disease - UTI, decubitus ulcer, others   Z91.89     13. Financial problems  Z59.9     14. Relationship problem between parent and child  Z62.820      Plan:  Prioritize personal health care over all concerns about family relationships, respect or lack thereof, and financial concerns. Self-affirm that higher care is both necessary and affordable, and do not talk self out of engaging treatment and relocation based on imagined financial issues.  Collaborate with medical social worker and other home health when PCP sends Begin looking into options and costs for independent living Upon further evidence of failure to manage or risk of more serious self-neglect, call Adult Protective Services Other recommendations/advice as may be noted above Continue to utilize previously learned skills ad lib Maintain medication as prescribed and work faithfully with relevant prescriber(s) if any changes are desired or seem indicated Call the clinic on-call service, 988/hotline, 911, or present to [facility] or ER if any life-threatening psychiatric crisis Return for time as available. Already scheduled visit in this office 08/29/2022.  Blanchie Serve, PhD Jenny Moore, PhD LP Clinical Psychologist, [facility] [address], [place], [place] [address] [phone]

## 2022-06-16 NOTE — Telephone Encounter (Signed)
(  FYI) Called pt twice, no answer. LM for pt  to call the office to reschedule her follow up appt.

## 2022-06-16 NOTE — Telephone Encounter (Signed)
Caller name: Shelvy   On DPR? :yes/no: Yes  Call back number: [phone]  Provider they see: Carlota Raspberry  Reason for call:  Pt called to ask if she can put triamcinolone cream on her private area. Pt wanted to know  will she be able to get more cream because she ready used a 1/3 of the cream. Pt stated her rashes are big, so she is using a lot cream at one time.

## 2022-06-16 NOTE — Telephone Encounter (Signed)
Caller name: Benjamine Mola with [facility]  On DPR? :yes/no: No  Call back number: [phone]  Provider they see: Carlota Raspberry  Reason for call: calling in reference to referral for pt; would like a call back.

## 2022-06-16 NOTE — Telephone Encounter (Signed)
I sent a message thru Epic in regards to message . Pt is asking if she can use the Triamcinolone cream on private area

## 2022-06-19 ENCOUNTER — Other Ambulatory Visit (HOSPITAL_COMMUNITY): Payer: Self-pay

## 2022-06-20 DIAGNOSIS — L409 Psoriasis, unspecified: Secondary | ICD-10-CM | POA: Diagnosis not present

## 2022-06-20 DIAGNOSIS — I739 Peripheral vascular disease, unspecified: Secondary | ICD-10-CM | POA: Diagnosis not present

## 2022-06-20 DIAGNOSIS — J84112 Idiopathic pulmonary fibrosis: Secondary | ICD-10-CM | POA: Diagnosis not present

## 2022-06-20 DIAGNOSIS — F17208 Nicotine dependence, unspecified, with other nicotine-induced disorders: Secondary | ICD-10-CM | POA: Diagnosis not present

## 2022-06-20 DIAGNOSIS — G2581 Restless legs syndrome: Secondary | ICD-10-CM | POA: Diagnosis not present

## 2022-06-20 DIAGNOSIS — Z7901 Long term (current) use of anticoagulants: Secondary | ICD-10-CM | POA: Diagnosis not present

## 2022-06-20 DIAGNOSIS — R32 Unspecified urinary incontinence: Secondary | ICD-10-CM | POA: Diagnosis not present

## 2022-06-20 DIAGNOSIS — E785 Hyperlipidemia, unspecified: Secondary | ICD-10-CM | POA: Diagnosis not present

## 2022-06-20 DIAGNOSIS — I2510 Atherosclerotic heart disease of native coronary artery without angina pectoris: Secondary | ICD-10-CM | POA: Diagnosis not present

## 2022-06-20 DIAGNOSIS — J9610 Chronic respiratory failure, unspecified whether with hypoxia or hypercapnia: Secondary | ICD-10-CM | POA: Diagnosis not present

## 2022-06-20 DIAGNOSIS — M5442 Lumbago with sciatica, left side: Secondary | ICD-10-CM | POA: Diagnosis not present

## 2022-06-20 DIAGNOSIS — F419 Anxiety disorder, unspecified: Secondary | ICD-10-CM | POA: Diagnosis not present

## 2022-06-20 DIAGNOSIS — F319 Bipolar disorder, unspecified: Secondary | ICD-10-CM | POA: Diagnosis not present

## 2022-06-20 DIAGNOSIS — H9193 Unspecified hearing loss, bilateral: Secondary | ICD-10-CM | POA: Diagnosis not present

## 2022-06-20 DIAGNOSIS — Z8673 Personal history of transient ischemic attack (TIA), and cerebral infarction without residual deficits: Secondary | ICD-10-CM | POA: Diagnosis not present

## 2022-06-20 DIAGNOSIS — F32A Depression, unspecified: Secondary | ICD-10-CM | POA: Diagnosis not present

## 2022-06-20 DIAGNOSIS — G3184 Mild cognitive impairment, so stated: Secondary | ICD-10-CM | POA: Diagnosis not present

## 2022-06-20 DIAGNOSIS — J432 Centrilobular emphysema: Secondary | ICD-10-CM | POA: Diagnosis not present

## 2022-06-20 DIAGNOSIS — Z87440 Personal history of urinary (tract) infections: Secondary | ICD-10-CM | POA: Diagnosis not present

## 2022-06-20 DIAGNOSIS — R21 Rash and other nonspecific skin eruption: Secondary | ICD-10-CM | POA: Diagnosis not present

## 2022-06-22 ENCOUNTER — Ambulatory Visit: Payer: Medicare Other | Admitting: Family Medicine

## 2022-06-22 ENCOUNTER — Other Ambulatory Visit: Payer: Self-pay | Admitting: Family Medicine

## 2022-06-22 DIAGNOSIS — L409 Psoriasis, unspecified: Secondary | ICD-10-CM

## 2022-06-22 MED ORDER — TRIAMCINOLONE ACETONIDE 0.1 % EX CREA: 1.0000 | TOPICAL_CREAM | Freq: Two times a day (BID) | CUTANEOUS | 0 refills | Status: DC

## 2022-06-22 MED ORDER — TRIAMCINOLONE ACETONIDE 0.1 % EX CREA: 1.0000 | TOPICAL_CREAM | Freq: Two times a day (BID) | CUTANEOUS | 1 refills | Status: DC

## 2022-06-22 NOTE — Telephone Encounter (Signed)
Patient is asking follow up questions about how much she should be using and for how long, was unsure how to direct due to severity of the rash and extensive area, please advise  patient will likely require a refill of the steroid cream from the sounds of the amount she is using

## 2022-06-22 NOTE — Progress Notes (Signed)
See my chart message.  Should have 1 refill of steroid cream, but new prescription sent just in case.

## 2022-06-26 ENCOUNTER — Encounter: Payer: Medicare Other | Admitting: Family Medicine

## 2022-06-29 ENCOUNTER — Ambulatory Visit (INDEPENDENT_AMBULATORY_CARE_PROVIDER_SITE_OTHER): Payer: Medicare Other | Admitting: Family Medicine

## 2022-06-29 ENCOUNTER — Encounter: Payer: Self-pay | Admitting: Family Medicine

## 2022-06-29 VITALS — Ht 66.0 in | Wt 160.0 lb

## 2022-06-29 DIAGNOSIS — F329 Major depressive disorder, single episode, unspecified: Secondary | ICD-10-CM

## 2022-06-29 DIAGNOSIS — L409 Psoriasis, unspecified: Secondary | ICD-10-CM | POA: Diagnosis not present

## 2022-06-29 DIAGNOSIS — R21 Rash and other nonspecific skin eruption: Secondary | ICD-10-CM | POA: Diagnosis not present

## 2022-06-29 NOTE — Progress Notes (Signed)
Virtual Visit via Video Note  I connected with [name] on 06/29/22 at 2:22 PM by a video enabled telemedicine application and verified that I am speaking with the correct person using two identifiers.  Patient location: home by self.  My location: office - [place].    I discussed the limitations, risks, security and privacy concerns of performing an evaluation and management service by telephone and the availability of in person appointments. I also discussed with the patient that there may be a patient responsible charge related to this service. The patient expressed understanding and agreed to proceed, consent obtained  Chief complaint:  Chief Complaint  Patient presents with   Rash    Pt states the rash is all over her body arms and upper legs, has slightly improved    Depression    PHQ9 - 13   History of Present Illness: [name] is a 76 y.o. female  Evaluated October 5, multiple concerns addressed at that time. Plan for 1 week follow-up to check on rash.  She has had transportation difficulty, and unable to be seen in person today, necessitating video visit.  Prior hypokalemia resolved and creatinine improved on October 5 labs.  Diffuse rash See last visit.  has been treated in ER September 21, Keflex, Diflucan, barrier cream.  Only partial treatment of Keflex, did take Diflucan.  Had rash in arms, legs as well.  History of psoriasis.  Treated with over-the-counter cream from [employer].Suspected intertriginous/inverse psoriasis in groin, genitalia and diffuse psoriasis in other areas.  Started triamcinolone 0.1% cream.  We discussed potential side effects, especially with long-term use. Has been changing incontinence pad frequently and cleansing.  Has been using cream to all areas BID.  Some improvement in rash on buttocks. Also improved with more air to area. More rash on arms, no blisters - more bumps on arms. Looks like prior psoriasis flares.  No vaginal or  intraoral; lesions.  No fever.  Itchy rash. Sore at times.    Known history of depression followed by psychiatry and therapist.  Clarified question of suicidal ideation. Denies feeling suicidal at this time. Fleeting thoughts. On and off for a few years, no thoughts of suicide in past few weeks. Has discussed with psychiatry.     06/29/2022    1:42 PM 06/15/2022    4:01 PM 06/01/2022   11:08 AM 12/14/2021    4:00 PM 05/11/2021    4:04 PM  Depression screen PHQ 2/9  Decreased Interest '3 3 3 2 2  '$ Down, Depressed, Hopeless '2 3 3 2 2  '$ PHQ - 2 Score '5 6 6 4 4  '$ Altered sleeping '2 1 3 2 2  '$ Tired, decreased energy '3 3 3 1 2  '$ Change in appetite 0 '2 3 1 1  '$ Feeling bad or failure about yourself  0 '1 1 2 2  '$ Trouble concentrating 2 1 0 1 1  Moving slowly or fidgety/restless 0 1 0 2 0  Suicidal thoughts 1 0 0 0 0  PHQ-9 Score '13 15 16 13 12    '$ Patient Active Problem List   Diagnosis Date Noted   Centrilobular emphysema ([place]) 10/19/2020   Chronic respiratory failure with hypoxia (HCC) 12/26/2019   Shortness of breath 12/26/2019   Bilateral pulmonary infiltrates on CXR    Complicated UTI (urinary tract infection) 11/11/2019   AKI (acute kidney injury) ([place]) 11/11/2019   Hyponatremia 11/11/2019   Elevated LFTs 11/11/2019   Sepsis ([place]) 11/11/2019   Wound  infection 11/07/2018   Postoperative pain    Sleep disturbance    Tobacco abuse    Acute blood loss anemia    Hypoalbuminemia due to protein-calorie malnutrition ([place])    Debility 10/24/2018   Pre-operative cardiovascular examination 09/26/2018   HLD (hyperlipidemia) 09/26/2018   GAD (generalized anxiety disorder) 08/07/2018   Major depressive disorder, single episode 08/07/2018   Appendiceal abscess 07/08/2018   PVD (peripheral vascular disease) ([place]) 03/15/2018   Atherosclerosis of native artery of left lower extremity with intermittent claudication ([place]) 03/15/2018   Chronic left-sided low back pain with left-sided sciatica  12/25/2017   Facial droop    History of CVA (cerebrovascular accident) 10/23/2017   Critical lower limb ischemia ([place]) 11/08/2016   Smoker 11/08/2016   Postinflammatory pulmonary fibrosis ([place]) 02/14/2016   Cigarette smoker 12/24/2015   Hypothyroid ? 01/16/2014   Mild cognitive impairment 01/16/2014   Meningioma ([place]) 10/21/2013   Chest pain 10/20/2013   Medicare annual wellness visit, subsequent 06/16/2013   Dizziness and giddiness 06/16/2013   RLS (restless legs syndrome) 04/29/2012   Abdominal pain 12/27/2010   DEGENERATIVE JOINT DISEASE 09/23/2010   CAROTID ARTERY DISEASE 08/15/2010   CHEST PAIN 08/15/2010   HEADACHE 12/02/2009   BACK PAIN 11/22/2009   CONSTIPATION, CHRONIC 01/29/2008   NAUSEA 01/28/2008   DEPRESSION 03/08/2007   HTN (hypertension) 03/08/2007   Past Medical History:  Diagnosis Date   AAA (abdominal aortic aneurysm) ([place])    3.1 cm 07/08/18, 3 year follow-up recommended; possible 3 cm AAA by aortogram 09/13/18   Anemia    PMH   Appendicitis with abscess    07/08/18, s/p perc drain; resolved 07/30/18 by CT   Arthritis    Bipolar disorder ([place])    Cerebrovascular disease    intra and extracranial vascular dx per MRI 4/11, neurology rec strict CVRF control   Colonic inertia    Constipation    chronic;severe   Coronary artery disease    Depression    Duodenitis    EKG abnormalities    changes, stress test neg (false EKG changes)   Gastritis    GERD (gastroesophageal reflux disease)    Hypertension    Hypothyroid 01/16/2014   Meningioma ([place]) 10/21/2013   PAD (peripheral artery disease) ([place])    Psoriasis    sees derm   Stroke [facility])    Wears dentures    Wears glasses    Past Surgical History:  Procedure Laterality Date   ABDOMINAL AORTOGRAM W/LOWER EXTREMITY N/A 09/13/2018   Procedure: ABDOMINAL AORTOGRAM W/LOWER EXTREMITY;  Surgeon: Elam Dutch, MD;  Location: [facility];  Service: Cardiovascular;  Laterality: N/A;   arthroscopy   04/2010   Right knee   CATARACT EXTRACTION W/ INTRAOCULAR LENS  IMPLANT, BILATERAL     COLONOSCOPY     FEMORAL-POPLITEAL BYPASS GRAFT  10/22/2018   FEMORAL-POPLITEAL BYPASS GRAFT Left 10/22/2018   Procedure: LEFT FEMORAL TO BELOW THE KNEE POPLITEAL ARTERY BYPASS GRAFT;  Surgeon: Elam Dutch, MD;  Location: [facility];  Service: Vascular;  Laterality: Left;   I & D EXTREMITY Left 11/08/2018   Procedure: IRRIGATION AND DEBRIDEMENT EXTREMITY Left Leg;  Surgeon: Elam Dutch, MD;  Location: [facility];  Service: Vascular;  Laterality: Left;   IR RADIOLOGIST EVAL & MGMT  07/30/2018   MULTIPLE TOOTH EXTRACTIONS     TUBAL LIGATION     No Known Allergies Prior to Admission medications   Medication Sig Start Date End Date Taking? Authorizing Provider  albuterol (  VENTOLIN HFA) 108 (90 Base) MCG/ACT inhaler Inhale 1-2 puffs into the lungs every 6 (six) hours as needed for wheezing or shortness of breath. 06/09/22  Yes [name], MD  clonazePAM (KLONOPIN) 0.5 MG tablet TAKE 1 TABLET(0.5 MG) BY MOUTH TWICE DAILY AS NEEDED FOR ANXIETY 11/03/21  Yes Cottle, Billey Co., MD  DULoxetine (CYMBALTA) 30 MG capsule Take 1 capsule (30 mg total) by mouth 2 (two) times daily. 11/03/21  Yes Cottle, Billey Co., MD  famotidine (PEPCID) 20 MG tablet One at bedtime 02/14/16  Yes Tanda Rockers, MD  Magnesium Hydroxide (DULCOLAX PO) Take by mouth as needed.   Yes [provider]  Melatonin 10 MG TABS Take 20 mg by mouth at bedtime.   Yes [provider]  rOPINIRole (REQUIP) 2 MG tablet Take 1 tablet (2 mg total) by mouth at bedtime. 11/03/21  Yes Cottle, Billey Co., MD  triamcinolone cream (KENALOG) 0.1 % Apply 1 Application topically 2 (two) times daily. 06/22/22  Yes [name], MD  XARELTO 20 MG TABS tablet TAKE 1 TABLET(20 MG) BY MOUTH DAILY WITH SUPPER 10/31/21  Yes [name], MD  amLODipine (NORVASC) 10 MG tablet 1 tablet Orally Once a day for 30 day(s)    [provider]   cephALEXin (KEFLEX) 500 MG capsule Take 1 capsule (500 mg total) by mouth 4 (four) times daily. Patient not taking: Reported on 06/15/2022 06/01/22   Small, Brooke L, PA  clonazePAM (KLONOPIN) 1 MG tablet 1 tablet Orally Once a day    [provider]  famotidine (PEPCID) 20 MG tablet 1 tablet at bedtime as needed Orally Once a day for 30 day(s)    [provider]  lamoTRIgine (LAMICTAL) 25 MG tablet Take 3 tablets (75 mg total) by mouth daily. Patient not taking: Reported on 06/01/2022 11/03/21   Cottle, Billey Co., MD  linaclotide [facility]) 145 MCG CAPS capsule Take 1 capsule (145 mcg total) by mouth 2 (two) times daily. Patient not taking: Reported on 06/01/2022 07/01/21   Yetta Flock, MD  rOPINIRole (REQUIP) 1 MG tablet 1 tablet 1 to 3 hours before bedtime Orally Once a day for 30 day(s)    [provider]  Senna EXTR Take by mouth as needed. Patient not taking: Reported on 06/15/2022    [provider]   Social History   Socioeconomic History   Marital status: Widowed    Spouse name: Not on file   Number of children: 1   Years of education: Not on file   Highest education level: Not on file  Occupational History   Occupation: retired  Tobacco Use   Smoking status: Every Day    Packs/day: 0.50    Years: 60.00    Total pack years: 30.00    Types: Cigarettes    Last attempt to quit: 11/10/2019    Years since quitting: 2.6   Smokeless tobacco: Never  Vaping Use   Vaping Use: Former  Substance and Sexual Activity   Alcohol use: Yes    Alcohol/week: 0.0 standard drinks of alcohol    Comment: yes on occassion   Drug use: No   Sexual activity: Not Currently  Other Topics Concern   Not on file  Social History Narrative   Brother in law Mr Rexford Maus (one of my patients)   Lives w/ husband       Social Determinants of Health   Financial Resource Strain: Low Risk  (03/23/2021)  Overall Financial Resource Strain (CARDIA)     Difficulty of Paying Living Expenses: Not hard at all  Food Insecurity: No Food Insecurity (03/23/2021)   Hunger Vital Sign    Worried About Running Out of Food in the Last Year: Never true    Ran Out of Food in the Last Year: Never true  Transportation Needs: No Transportation Needs (03/23/2021)   PRAPARE - Hydrologist (Medical): No    Lack of Transportation (Non-Medical): No  Physical Activity: Inactive (03/23/2021)   Exercise Vital Sign    Days of Exercise per Week: 0 days    Minutes of Exercise per Session: 0 min  Stress: Not on file  Social Connections: Unknown (10/24/2018)   Social Connection and Isolation Panel [NHANES]    Frequency of Communication with Friends and Family: Patient refused    Frequency of Social Gatherings with Friends and Family: Patient refused    Attends Religious Services: Patient refused    Active Member of Clubs or Organizations: Patient refused    Attends Archivist Meetings: Patient refused    Marital Status: Patient refused  Intimate Partner Violence: Unknown (10/24/2018)   Humiliation, Afraid, Rape, and Kick questionnaire    Fear of Current or Ex-Partner: Patient refused    Emotionally Abused: Patient refused    Physically Abused: Patient refused    Sexually Abused: Patient refused    Observations/Objective: Vitals:   06/29/22 1346  Weight: 160 lb (72.6 kg)  Height: '5\' 6"'$  (1.676 m)  Nontoxic appearance on video.  Unable to effectively see rash on video, gentle exam was deferred. Speaking in full sentences without respiratory distress.  Appropriate responses and does not appear to be responding to internal stimuli.  Denies suicidal ideation.   Assessment and Plan: Psoriasis - Plan: Ambulatory referral to Dermatology Rash of genital area - Plan: Ambulatory referral to Dermatology  -Diffuse rash, consistent with previous presentation of psoriasis per patient.  Possible combination of inverse  psoriasis/intertriginous psoriasis and genitals with some improvement with steroid cream plus or minus contact/irritant dermatitis in that area.  She does report some improvement in her genital rash, new areas on arms appear to be consistent with previous psoriasis.    - Barrier cream such as Desitin was discussed, for genital area, continue to change pads and minimize urine exposure to that skin, continue to allow airflow to that area is much as possible as that has been helpful.  Continue topical steroid cream for now and will try to have her seen by dermatology soon as likely will need more aggressive treatment, systemic treatment.  ER precautions given if any new or worsening symptoms.  Major depressive disorder, remission status unspecified, unspecified whether recurrent Followed by psychiatry and therapist as above, 988 hotline discussed if any return of suicidal ideation but denies at this time.  She agrees to call that number and discussed with therapist if symptoms recur.  Advised that she discuss with her therapist prior suicidal ideation.   Follow Up Instructions: 1 week with dermatology, 1 month for chronic medical conditions with me   I discussed the assessment and treatment plan with the patient. The patient was provided an opportunity to ask questions and all were answered. The patient agreed with the plan and demonstrated an understanding of the instructions.   The patient was advised to call back or seek an in-person evaluation if the symptoms worsen or if the condition fails to improve as anticipated.   [name],  MD

## 2022-06-29 NOTE — Patient Instructions (Addendum)
Glad to hear there has been some improvement in rash.  The increased amount of rash on arms is concerning but if it appears to be your typical psoriasis rash, that just reinforces the need to see dermatologist as you likely will need some systemic therapy.  Okay to continue the steroid cream to those areas twice per day for now and I will try to work on getting you into see dermatology quickly.  If any new blisters, new rash, fevers, chills, or other new symptoms be seen in person right away.   Please contact your therapist to discuss suicidal thoughts experienced previously. Call [phone] hotline if you have any return of the symptoms.  Take care and hang in there.  Follow-up in 1 month to review your chronic medical problems but I am happy to see you sooner if needed.

## 2022-07-02 ENCOUNTER — Telehealth: Payer: Self-pay | Admitting: Internal Medicine

## 2022-07-02 NOTE — Telephone Encounter (Signed)
Front desk  Last seen > 1 year ago. 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 Not on Rx. Dr Jenetta Downer wanted her seen in [facility] clinic but earlier in 2023 [name] could not get hold of her  Plan - - get PFT / HRCT supine/prone -> get her to office with me - if she  cannot do those due to transport issues, atleast get a virtual visit by end of 2023 with me -> to get starrtted

## 2022-07-06 ENCOUNTER — Telehealth: Payer: Self-pay | Admitting: Family Medicine

## 2022-07-06 DIAGNOSIS — J432 Centrilobular emphysema: Secondary | ICD-10-CM | POA: Diagnosis not present

## 2022-07-06 DIAGNOSIS — R32 Unspecified urinary incontinence: Secondary | ICD-10-CM | POA: Diagnosis not present

## 2022-07-06 DIAGNOSIS — J84112 Idiopathic pulmonary fibrosis: Secondary | ICD-10-CM | POA: Diagnosis not present

## 2022-07-06 DIAGNOSIS — F32A Depression, unspecified: Secondary | ICD-10-CM | POA: Diagnosis not present

## 2022-07-06 DIAGNOSIS — F419 Anxiety disorder, unspecified: Secondary | ICD-10-CM | POA: Diagnosis not present

## 2022-07-06 DIAGNOSIS — R21 Rash and other nonspecific skin eruption: Secondary | ICD-10-CM | POA: Diagnosis not present

## 2022-07-06 NOTE — Telephone Encounter (Signed)
Caller name: Horris Latino from [facility]   On DPR?: NO  Call back number: [phone]  Provider they see: [name], MD  Reason for call: Horris Latino stated pt pulse was 48. Horris Latino also states that she check pt pulse manually, she notice that the pt normal pulse is  irregular pulse. Verbal order OT once a week for a week. Twice a week for five weeks.

## 2022-07-07 ENCOUNTER — Telehealth: Payer: Self-pay | Admitting: Lab

## 2022-07-07 ENCOUNTER — Telehealth: Payer: Self-pay | Admitting: Family Medicine

## 2022-07-07 NOTE — Telephone Encounter (Signed)
Received home health forms from [facility]. Placed in front bin with charge sheet.

## 2022-07-07 NOTE — Telephone Encounter (Signed)
Received paperwork from the front and put it in Dr. Mancel Bale folder to be reviewed on 07/07/2022

## 2022-07-07 NOTE — Telephone Encounter (Signed)
Made an appt to see Dr Carlota Raspberry pt refused to see cardiology as she states she did not like the practice she went to. States she only trusts Dr Carlota Raspberry and states she feels well, declined sooner appt with Dr Birdie Riddle next week

## 2022-07-07 NOTE — Telephone Encounter (Signed)
I am okay with verbal order for occupational therapy. Heart rate was in the 60s at her last visit with me and in the 70s at her ER visit September 21st.  If she is having low heart rate and irregular heart rate, would recommend evaluation.  This can be with any medical provider or urgent care/ER especially if any symptoms of lightheadedness, dizziness, or palpitations.

## 2022-07-07 NOTE — Telephone Encounter (Signed)
Please note the low pulse rate as well as irregular beats.  Are verbal orders acceptable for OT?

## 2022-07-07 NOTE — Telephone Encounter (Signed)
Called and provided verbal orders for OT, called [name] to discuss advisement of evaluation, patient used to be tachycardic,  made appt to see [name]

## 2022-07-07 NOTE — Telephone Encounter (Signed)
FYI: Patient requested to see Dr. Ander Slade for follow up before scheduling with a new provider. Scheduled with AO on 11/14 at 2:30pm.

## 2022-07-10 ENCOUNTER — Telehealth: Payer: Self-pay | Admitting: Lab

## 2022-07-10 NOTE — Telephone Encounter (Signed)
Retrieved from the fax folder at nurse station and did fax this back to [facility], did not send to billing as provider indicated no charge for this form

## 2022-07-10 NOTE — Telephone Encounter (Signed)
Sounds good and agree. Closing message

## 2022-07-10 NOTE — Telephone Encounter (Signed)
Placed information from [facility] in Dr. Mancel Bale folder

## 2022-07-10 NOTE — Telephone Encounter (Signed)
Received a home health form from [facility]. Placed in front bin with charge sheet.

## 2022-07-10 NOTE — Telephone Encounter (Signed)
Done, placed in fax box.

## 2022-07-11 DIAGNOSIS — F32A Depression, unspecified: Secondary | ICD-10-CM | POA: Diagnosis not present

## 2022-07-11 DIAGNOSIS — R32 Unspecified urinary incontinence: Secondary | ICD-10-CM | POA: Diagnosis not present

## 2022-07-11 DIAGNOSIS — R21 Rash and other nonspecific skin eruption: Secondary | ICD-10-CM | POA: Diagnosis not present

## 2022-07-11 DIAGNOSIS — J84112 Idiopathic pulmonary fibrosis: Secondary | ICD-10-CM | POA: Diagnosis not present

## 2022-07-11 DIAGNOSIS — F419 Anxiety disorder, unspecified: Secondary | ICD-10-CM | POA: Diagnosis not present

## 2022-07-11 DIAGNOSIS — J432 Centrilobular emphysema: Secondary | ICD-10-CM | POA: Diagnosis not present

## 2022-07-14 ENCOUNTER — Telehealth: Payer: Self-pay | Admitting: Lab

## 2022-07-14 NOTE — Telephone Encounter (Signed)
No note required.

## 2022-07-14 NOTE — Telephone Encounter (Signed)
No notes required.

## 2022-07-14 NOTE — Telephone Encounter (Signed)
Received paper from the front about [name]'s dermatology appointment being cancelled and rescheduled at [facility]

## 2022-07-17 ENCOUNTER — Telehealth: Payer: Self-pay | Admitting: Lab

## 2022-07-17 ENCOUNTER — Telehealth: Payer: Self-pay | Admitting: Family Medicine

## 2022-07-17 NOTE — Telephone Encounter (Signed)
Received Ernest paperwork from the from and put it in Dr. Mancel Bale folder to review and sign

## 2022-07-17 NOTE — Telephone Encounter (Signed)
Received home health form from [facility]. Placed in front bin with charge sheet.

## 2022-07-18 ENCOUNTER — Telehealth: Payer: Self-pay | Admitting: Lab

## 2022-07-18 NOTE — Telephone Encounter (Signed)
Faxed over paperwork to [facility] to [phone]

## 2022-07-18 NOTE — Telephone Encounter (Signed)
Faxed over forms to [facility] at [phone]

## 2022-07-20 DIAGNOSIS — F32A Depression, unspecified: Secondary | ICD-10-CM | POA: Diagnosis not present

## 2022-07-20 DIAGNOSIS — I2510 Atherosclerotic heart disease of native coronary artery without angina pectoris: Secondary | ICD-10-CM | POA: Diagnosis not present

## 2022-07-20 DIAGNOSIS — E785 Hyperlipidemia, unspecified: Secondary | ICD-10-CM | POA: Diagnosis not present

## 2022-07-20 DIAGNOSIS — F17208 Nicotine dependence, unspecified, with other nicotine-induced disorders: Secondary | ICD-10-CM | POA: Diagnosis not present

## 2022-07-20 DIAGNOSIS — F319 Bipolar disorder, unspecified: Secondary | ICD-10-CM | POA: Diagnosis not present

## 2022-07-20 DIAGNOSIS — Z87440 Personal history of urinary (tract) infections: Secondary | ICD-10-CM | POA: Diagnosis not present

## 2022-07-20 DIAGNOSIS — J9610 Chronic respiratory failure, unspecified whether with hypoxia or hypercapnia: Secondary | ICD-10-CM | POA: Diagnosis not present

## 2022-07-20 DIAGNOSIS — H9193 Unspecified hearing loss, bilateral: Secondary | ICD-10-CM | POA: Diagnosis not present

## 2022-07-20 DIAGNOSIS — I739 Peripheral vascular disease, unspecified: Secondary | ICD-10-CM | POA: Diagnosis not present

## 2022-07-20 DIAGNOSIS — F419 Anxiety disorder, unspecified: Secondary | ICD-10-CM | POA: Diagnosis not present

## 2022-07-20 DIAGNOSIS — G3184 Mild cognitive impairment, so stated: Secondary | ICD-10-CM | POA: Diagnosis not present

## 2022-07-20 DIAGNOSIS — R21 Rash and other nonspecific skin eruption: Secondary | ICD-10-CM | POA: Diagnosis not present

## 2022-07-20 DIAGNOSIS — L409 Psoriasis, unspecified: Secondary | ICD-10-CM | POA: Diagnosis not present

## 2022-07-20 DIAGNOSIS — R32 Unspecified urinary incontinence: Secondary | ICD-10-CM | POA: Diagnosis not present

## 2022-07-20 DIAGNOSIS — Z8673 Personal history of transient ischemic attack (TIA), and cerebral infarction without residual deficits: Secondary | ICD-10-CM | POA: Diagnosis not present

## 2022-07-20 DIAGNOSIS — G2581 Restless legs syndrome: Secondary | ICD-10-CM | POA: Diagnosis not present

## 2022-07-20 DIAGNOSIS — J84112 Idiopathic pulmonary fibrosis: Secondary | ICD-10-CM | POA: Diagnosis not present

## 2022-07-20 DIAGNOSIS — J432 Centrilobular emphysema: Secondary | ICD-10-CM | POA: Diagnosis not present

## 2022-07-20 DIAGNOSIS — M5442 Lumbago with sciatica, left side: Secondary | ICD-10-CM | POA: Diagnosis not present

## 2022-07-20 DIAGNOSIS — Z7901 Long term (current) use of anticoagulants: Secondary | ICD-10-CM | POA: Diagnosis not present

## 2022-07-21 ENCOUNTER — Telehealth: Payer: Self-pay | Admitting: Family Medicine

## 2022-07-21 DIAGNOSIS — J432 Centrilobular emphysema: Secondary | ICD-10-CM | POA: Diagnosis not present

## 2022-07-21 DIAGNOSIS — F419 Anxiety disorder, unspecified: Secondary | ICD-10-CM | POA: Diagnosis not present

## 2022-07-21 DIAGNOSIS — J84112 Idiopathic pulmonary fibrosis: Secondary | ICD-10-CM | POA: Diagnosis not present

## 2022-07-21 DIAGNOSIS — R21 Rash and other nonspecific skin eruption: Secondary | ICD-10-CM | POA: Diagnosis not present

## 2022-07-21 DIAGNOSIS — R32 Unspecified urinary incontinence: Secondary | ICD-10-CM | POA: Diagnosis not present

## 2022-07-21 DIAGNOSIS — F32A Depression, unspecified: Secondary | ICD-10-CM | POA: Diagnosis not present

## 2022-07-21 NOTE — Telephone Encounter (Signed)
Received home health form from [facility]. Placed in front bin.

## 2022-07-21 NOTE — Telephone Encounter (Signed)
Placed in your review folder  

## 2022-07-25 ENCOUNTER — Ambulatory Visit: Payer: Medicare Other | Admitting: Pulmonary Disease

## 2022-07-26 DIAGNOSIS — J84112 Idiopathic pulmonary fibrosis: Secondary | ICD-10-CM | POA: Diagnosis not present

## 2022-07-26 DIAGNOSIS — J432 Centrilobular emphysema: Secondary | ICD-10-CM | POA: Diagnosis not present

## 2022-07-26 DIAGNOSIS — R21 Rash and other nonspecific skin eruption: Secondary | ICD-10-CM | POA: Diagnosis not present

## 2022-07-26 DIAGNOSIS — F32A Depression, unspecified: Secondary | ICD-10-CM | POA: Diagnosis not present

## 2022-07-26 DIAGNOSIS — F419 Anxiety disorder, unspecified: Secondary | ICD-10-CM | POA: Diagnosis not present

## 2022-07-26 DIAGNOSIS — R32 Unspecified urinary incontinence: Secondary | ICD-10-CM | POA: Diagnosis not present

## 2022-07-27 ENCOUNTER — Encounter: Payer: Medicare Other | Admitting: Family Medicine

## 2022-07-28 ENCOUNTER — Telehealth: Payer: Self-pay | Admitting: Lab

## 2022-07-28 ENCOUNTER — Telehealth: Payer: Self-pay | Admitting: Family Medicine

## 2022-07-28 DIAGNOSIS — R21 Rash and other nonspecific skin eruption: Secondary | ICD-10-CM | POA: Diagnosis not present

## 2022-07-28 DIAGNOSIS — R32 Unspecified urinary incontinence: Secondary | ICD-10-CM | POA: Diagnosis not present

## 2022-07-28 DIAGNOSIS — F32A Depression, unspecified: Secondary | ICD-10-CM | POA: Diagnosis not present

## 2022-07-28 DIAGNOSIS — J432 Centrilobular emphysema: Secondary | ICD-10-CM | POA: Diagnosis not present

## 2022-07-28 DIAGNOSIS — J84112 Idiopathic pulmonary fibrosis: Secondary | ICD-10-CM | POA: Diagnosis not present

## 2022-07-28 DIAGNOSIS — F419 Anxiety disorder, unspecified: Secondary | ICD-10-CM | POA: Diagnosis not present

## 2022-07-28 NOTE — Telephone Encounter (Signed)
Received a form from [facility]. Placed in front bin.

## 2022-07-28 NOTE — Telephone Encounter (Signed)
No note needed 

## 2022-07-31 DIAGNOSIS — R21 Rash and other nonspecific skin eruption: Secondary | ICD-10-CM | POA: Diagnosis not present

## 2022-07-31 DIAGNOSIS — J432 Centrilobular emphysema: Secondary | ICD-10-CM | POA: Diagnosis not present

## 2022-07-31 DIAGNOSIS — F32A Depression, unspecified: Secondary | ICD-10-CM | POA: Diagnosis not present

## 2022-07-31 DIAGNOSIS — F419 Anxiety disorder, unspecified: Secondary | ICD-10-CM | POA: Diagnosis not present

## 2022-07-31 DIAGNOSIS — J84112 Idiopathic pulmonary fibrosis: Secondary | ICD-10-CM | POA: Diagnosis not present

## 2022-07-31 DIAGNOSIS — R32 Unspecified urinary incontinence: Secondary | ICD-10-CM | POA: Diagnosis not present

## 2022-08-02 DIAGNOSIS — F419 Anxiety disorder, unspecified: Secondary | ICD-10-CM | POA: Diagnosis not present

## 2022-08-02 DIAGNOSIS — R32 Unspecified urinary incontinence: Secondary | ICD-10-CM | POA: Diagnosis not present

## 2022-08-02 DIAGNOSIS — F32A Depression, unspecified: Secondary | ICD-10-CM | POA: Diagnosis not present

## 2022-08-02 DIAGNOSIS — J432 Centrilobular emphysema: Secondary | ICD-10-CM | POA: Diagnosis not present

## 2022-08-02 DIAGNOSIS — J84112 Idiopathic pulmonary fibrosis: Secondary | ICD-10-CM | POA: Diagnosis not present

## 2022-08-02 DIAGNOSIS — R21 Rash and other nonspecific skin eruption: Secondary | ICD-10-CM | POA: Diagnosis not present

## 2022-08-07 ENCOUNTER — Telehealth: Payer: Self-pay | Admitting: Lab

## 2022-08-07 NOTE — Telephone Encounter (Signed)
Paperwork completed and placed in fax bin at back nurse station  

## 2022-08-07 NOTE — Telephone Encounter (Signed)
Received [facility] home health paperwork from the front to put in Dr. Mancel Bale folder for review

## 2022-08-17 ENCOUNTER — Telehealth: Payer: Self-pay

## 2022-08-17 NOTE — Telephone Encounter (Signed)
Will OT from [facility] home health asking to do a discharge as they have been unable to contact the patient for care for several weeks now

## 2022-08-28 ENCOUNTER — Telehealth: Payer: Self-pay | Admitting: Family Medicine

## 2022-08-28 NOTE — Telephone Encounter (Signed)
Received forms from [facility]. Placed in front bin.

## 2022-08-29 ENCOUNTER — Ambulatory Visit (INDEPENDENT_AMBULATORY_CARE_PROVIDER_SITE_OTHER): Payer: Medicare Other | Admitting: Psychiatry

## 2022-08-29 ENCOUNTER — Encounter: Payer: Self-pay | Admitting: Psychiatry

## 2022-08-29 DIAGNOSIS — Z8673 Personal history of transient ischemic attack (TIA), and cerebral infarction without residual deficits: Secondary | ICD-10-CM

## 2022-08-29 DIAGNOSIS — G3184 Mild cognitive impairment, so stated: Secondary | ICD-10-CM

## 2022-08-29 DIAGNOSIS — G4721 Circadian rhythm sleep disorder, delayed sleep phase type: Secondary | ICD-10-CM | POA: Diagnosis not present

## 2022-08-29 DIAGNOSIS — F331 Major depressive disorder, recurrent, moderate: Secondary | ICD-10-CM

## 2022-08-29 DIAGNOSIS — G2581 Restless legs syndrome: Secondary | ICD-10-CM | POA: Diagnosis not present

## 2022-08-29 DIAGNOSIS — F411 Generalized anxiety disorder: Secondary | ICD-10-CM | POA: Diagnosis not present

## 2022-08-29 NOTE — Progress Notes (Signed)
[name] [id number] 12-01-1945 76 y.o.   Virtual Visit via Telephone Note  I connected with pt by telephone and verified that I am speaking with the correct person using two identifiers.   I discussed the limitations, risks, security and privacy concerns of performing an evaluation and management service by telephone and the availability of in person appointments. I also discussed with the patient that there may be a patient responsible charge related to this service. The patient expressed understanding and agreed to proceed.  I discussed the assessment and treatment plan with the patient. The patient was provided an opportunity to ask questions and all were answered. The patient agreed with the plan and demonstrated an understanding of the instructions.   The patient was advised to call back or seek an in-person evaluation if the symptoms worsen or if the condition fails to improve as anticipated.  I provided 30 minutes of non-face-to-face time during this encounter. The patient was located at home and the provider was located office. Session from 230-3 00 Televisit bc pt cannot drive bc of eyes  Subjective:   Patient ID:  [name] is a 76 y.o. (DOB [birth year]) female.  Chief Complaint:  Chief Complaint  Patient presents with   Follow-up   Depression   Anxiety    Depression        Associated symptoms include decreased concentration and fatigue.  Associated symptoms include no suicidal ideas.  Past medical history includes anxiety.   Anxiety Symptoms include decreased concentration, dizziness, nausea, nervous/anxious behavior and shortness of breath. Patient reports no confusion, palpitations or suicidal ideas.     LACINDA CURVIN presents to the office today for follow-up of depression and anxiety and MCI.    at visit January 09, 2019.  She was having problems with restless legs and ropinirole was increased to 3 mg every afternoon.  There were no other med  changes.  visit Feb 05, 2019.  She was having mixture of depression and anxiety and chronic insomnia with delayed sleep phase.  For the reasons mentioned at the last visit she was started on risperidone 0.25 mg nightly.  She was maintained on duloxetine 90 mg, clonazepam 1 mg nightly, lamotrigine 150 twice daily, and ropinirole 2 mg 1-1/2 tablets nightly for restless legs.  visit September 2020.  She had stopped the risperidone because she only took it a few days.  She felt nausea and spaced out on it.  No other meds were changed.  Last visit December 2020.  The following was noted: Doing very poorly.  Not getting dressed and not out of the house for 5 days.  Hard to walk with pain.  Falling apart overnight.  Feet hurt at night.  Worries over poor circulation to toes.  Placed on blood thinners.  Continued health problems with COPD.  Energy is low and hard time walking.  Arthritis in back.  House on the market and that's stressful.  Sx largely unchanged but "it's circumstances".  Planning to move to [facility] but ambivalent.  Brother in law lives next door and wants her to stay here.  Also stressed over politics and social things.   Had period of crippling nausea and cut all meds back in 1/2.  Thinks it's mental issue.  Nausea is some better with dose reduction but is more depressed.  Plan: Use pill box to improve compliance. DT nausea  split meds but increase duloxetine 30 mg BID and lamotrigine 75 mg BID. She's been on lower  dose DT nausea but is more depressed.  January 02, 2020 appointment, the following is noted: She got septic with a UTI and then was admitted to [facility]. Home now.  Getting stronger.  Having to take nausea meds. Also diarrhea.  Would like to be on higher dose of meds but can't tolerate it.  Still on some prednisone.  Pending cardiology with CHF.  Also pulmonology. Sleep is pretty good with meds.   No meds changed.  04/26/20 appt with the following noted:  Missed a  lot of med DT nausea.  Back on duloxetine 60 and lamotrigine 75 mg for a week.  Was more depressed and dizzy off the meds. Nausea comes and goes in waves but could be any time of day but maybe worse in the evening. Eats poorly usually.  If feels bad doesn't want to cook.   Pepcid helps nausea.  Zofran doesn't help. Lately more trouble falling asleep.  Plan: Rec trial mirtazapine 30 mg HS to help with depression and potentially nausea hopefully.  Nausea is interfering with depression treatment.  08/04/20 appt with following noted: Took mirtazapine 30 mg for a week and felt like a zombie with jitteriness outside.  Didn't help nausea much.  Noticing a pattern with nausea related to constipation and bowel problems.  Not ready to take lamotrigine bc it's hard to swallow but felt emotionally better when on it.  Wonders if bipolar bc jumps from topic to topic and talks a lot. Wants to stay on clonazepam to help her sleep.  Aware of memory concerns. Not taking lorazepam but not sure why she isn't.  Thinks it doesn't work as well as clonazepam.  Says she's taking clonazepam at night now. Plan;  Restart lamotrigine at 25 mg and use the small tablets to try to help mood and make it easier to swallow.  Purpose to help mood.  She felt it helped. Will leave duloxetine at 30 low dose. DC mirtazapine DT SE  10/11/2020 appointment with the following noted:  Started and stopped lamotrigine bc not sticking with schedule so just up to lamotrigine 50 mg daily. RLS requires ropinirole.  Sometimes forgets it and has to keep moving.    Periods of being horrible condition with depression but not suicidal.  Hasnt' been that bad lately.  Still grieving [name] and her sister.  Taking a long time.  Watch too much TV.  Sister [name] was her best friend.   Sometimes feels her presence. No energy to move back to [place] but son encourages it.  Procrastinates it bc moving is overwhelming.  Eating OK.  Enjoyed seeing son and  gkids.   Nausea better but comes and goes. Plan: Continue lamotrigine increase to 100 mg as recommended and use the small tablets to try to help mood and make it easier to swallow.  Purpose to help mood.  She felt it helped.  01/03/2021 appointment with following noted: Up some times and down some times.  Manages.  Kind of average. Not as depressed as in the past.  Still getting dizzy easily.  Sleep hours getting worse instead of bettter.  Once of twice weekly stays up all night and then naps.  Doesn't work out well for her body. Still tends to nausea but variable regardless of what she takes.  Most days consistent with lamotrigine.  Easier to swallow the small lamotrigine 25 vs 100 mg tablets. Don't feel the same since the Sepsis. Going to get driver's license renewed tomorrow. RLS  managed with ropinirole.   Sleep pretty good, except irregular hours.Pt reports that mood is Depressed worse at night and describes anxiety as worse and moderate in the same dose for depression but comes and goes.  She wonders if she might be bipolar like her sister.She is also having problems with irritability. Anxiety symptoms include: Excessive Worry, Panic Symptoms,. Pt reports sleep is more regular and consistent with melatonin 10 mg nightly.. Pt reports that appetite is decreased. Pt reports that energy is poor and loss of interest or pleasure in usual activities, poor motivation and withdrawn from usual activities. Concentration is scattered.  Suicidal thoughts:  denied by patient. B In Law next door.   Increased ropinirole helped RLS. Plan: No med changes  04/12/2021 appointment with the following noted: Had to stop driving bc of dizzy.  Eyes flutter.  Balance not always good.  Back pain. Fighting depression.  Fight with son over her moving to [place] and doesn't want to help. Still in [place].  Was hoping to move back to [place]. Lonely here and would like support of family. Otherwise feels better than last year.  Needs  son to help her move there and doesn't understand why. Not profoundly depressed but is down.   Lately increased clonazepam to 0.5 mg just at night for anxiety and helps her sleep better and have less anxious thoughts.   Not great sleep.  Eats frozen foods. B in law helps her. Wants to increase duloxetine to 60 mg daily but split dose bc of nausea easily. Plan: Wants to increase duloxetine to 60 mg daily but split dose bc of nausea easily.  06/16/2021 appt noted: Increased duloxetine to 60 mg daily and thinks it helped a little better.  But depression worse with family stress going on.  Son gets mad at her and yells.  Keeping GS from her and this is upsetting.   Can talk with him over the phone.  D-in-law keeping GS from her and it's wrong.  Son blames her but that's not true.   Doesn't think she's strong enough to pack up and move to [place].  Chronic dizziness and risk of falling.  Shuffles too much.   She does get online socially.   Pays attention to the news.   Satisfied with meds. Drinks diet Pepsi and coffee up until dinner. Plan: no med changes  11/03/21 appt noted: Hard to walk and probably needs TKR but no one to take care of her locally. Dropped duloxetine to 30 mg daily and lamotrigine to 25 mg 2 daily. Still on ropinirole 2 mg PM,  clonazepam 0.5 mg BID  No SE Anxiety cycles but lately it has calmed down.   Son visits from [place] every 6 weeks which helps her a lot.  Occ brings 76 yo GS. Other 2 GD are married. Was very close to her sister who has been dead 65 years and still grieving.  Her H still helps her with chores. H passed 3 years ago. Falls asleep on couch and sometimes not in bed.  Pattern irregular chronically. Neck pain. RLS managed if taken early enough.  01/17/22 appt noted: Son comes to check on her every 6 weeks.   Recognizes missing meds bc hates swallowing anything.  Also forgets things.   Chronic insomnia and disrupted cycle since lost her H and then her  longterm hosp with illness. Can't drive DT eyesight. B in law will driver her places and still helps her. Ongoing depression.  Not sure how much  longer she can stay at home and care for herself.  Weaker and balance not great.  Simple things like folding clothes is harder.   Chronic nausea. Overall satisfied with meds when takes them properly and doesn't really want to change.  Fear of fire.  08/29/22 appt noted: About the same.  Hard time swallowing meds. BC chronic nausea.  Stomach very sensitive and it has been since a little girl.   Kind of housebound.   Need something for depression and without antideprssants it is worse.  Depression is not severe all day long.  Worse at night.  Watches TV constantly to take her mind off troublesome thoughts.   Really upset over politics and her son has become Republican and that makes her feel terrible.  He swears at her and tells her she's a bad mother.   Taking duloxetine 30 mg usually HS.  No lamotrigine.  Still on clonazepam 1 mg HS, ropinirole 1 mg PM. Doesn't think she could use liquid antidepressants.   She doesn't have SE with clonazepam 1 mg HS.  Not over sedated with it or balance any worse.   Not so angry now but has been at times.    Her sister had bipolar disorder.  Past Psychiatric Medication Trials: Lithium 300 insomnia, Depakote, olanzapine 2.5,  lamotrigine 300, ropinirole as high as 2 mg 3 times daily for restless legs,  Aricept poorly tolerated,  Abilify side effects,  Fluoxetine history poop out, sertraline, paroxetine, Lexapro, duloxetine 90,  Mirtazapine 30 zombie Donepezil SE Long psychiatric history.  Sister bipolar. I have never prescribed lorazepam nor Xanax  Review of Systems:  Review of Systems  Constitutional:  Positive for fatigue.  Eyes:  Positive for visual disturbance.  Respiratory:  Positive for shortness of breath.   Cardiovascular:  Negative for palpitations.       Claudication   Gastrointestinal:  Positive  for nausea.  Musculoskeletal:  Positive for back pain, gait problem and neck pain.  Neurological:  Positive for dizziness and weakness. Negative for tremors.  Psychiatric/Behavioral:  Positive for decreased concentration, dysphoric mood and sleep disturbance. Negative for agitation, behavioral problems, confusion, hallucinations, self-injury and suicidal ideas. The patient is nervous/anxious. The patient is not hyperactive.    LEG surgery for claudication Jan 3  Medications: I have reviewed the patient's current medications.  Current Outpatient Medications  Medication Sig Dispense Refill   albuterol (VENTOLIN HFA) 108 (90 Base) MCG/ACT inhaler Inhale 1-2 puffs into the lungs every 6 (six) hours as needed for wheezing or shortness of breath. 18 g 2   amLODipine (NORVASC) 10 MG tablet 1 tablet Orally Once a day for 30 day(s)     clonazePAM (KLONOPIN) 1 MG tablet PRN     DULoxetine (CYMBALTA) 30 MG capsule Take 1 capsule (30 mg total) by mouth 2 (two) times daily. (Patient taking differently: Take 30 mg by mouth daily.) 180 capsule 1   famotidine (PEPCID) 20 MG tablet One at bedtime 30 tablet 11   famotidine (PEPCID) 20 MG tablet 1 tablet at bedtime as needed Orally Once a day for 30 day(s)     linaclotide (LINZESS) 145 MCG CAPS capsule Take 1 capsule (145 mcg total) by mouth 2 (two) times daily. 60 capsule 3   Magnesium Hydroxide (DULCOLAX PO) Take by mouth as needed.     Melatonin 10 MG TABS Take 20 mg by mouth at bedtime.     rOPINIRole (REQUIP) 1 MG tablet 1 tablet 1 to 3 hours before bedtime Orally  Once a day for 30 day(s)     Senna EXTR Take by mouth as needed.     triamcinolone cream (KENALOG) 0.1 % Apply 1 Application topically 2 (two) times daily. 80 g 0   XARELTO 20 MG TABS tablet TAKE 1 TABLET(20 MG) BY MOUTH DAILY WITH SUPPER 30 tablet 11   lamoTRIgine (LAMICTAL) 25 MG tablet Take 3 tablets (75 mg total) by mouth daily. (Patient not taking: Reported on 06/01/2022) 270 tablet 1   No  current facility-administered medications for this visit.    Medication Side Effects: None unless nausea  Allergies: No Known Allergies  Past Medical History:  Diagnosis Date   AAA (abdominal aortic aneurysm) (HCC)    3.1 cm 07/08/18, 3 year follow-up recommended; possible 3 cm AAA by aortogram 09/13/18   Anemia    PMH   Appendicitis with abscess    07/08/18, s/p perc drain; resolved 07/30/18 by CT   Arthritis    Bipolar disorder ([place])    Cerebrovascular disease    intra and extracranial vascular dx per MRI 4/11, neurology rec strict CVRF control   Colonic inertia    Constipation    chronic;severe   Coronary artery disease    Depression    Duodenitis    EKG abnormalities    changes, stress test neg (false EKG changes)   Gastritis    GERD (gastroesophageal reflux disease)    Hypertension    Hypothyroid 01/16/2014   Meningioma ([place]) 10/21/2013   PAD (peripheral artery disease) ([place])    Psoriasis    sees derm   Stroke [facility])    Wears dentures    Wears glasses     Family History  Problem Relation Age of Onset   Breast cancer Mother        metastisis to bones   Cancer Sister 76       spinal   Alcohol abuse Brother    Heart disease Brother    Lung cancer Brother        smoked   Diabetes Son    Heart disease Maternal Aunt    Heart disease Other        grandfather    Colon cancer Neg Hx    Esophageal cancer Neg Hx    Stomach cancer Neg Hx     Social History   Socioeconomic History   Marital status: Widowed    Spouse name: Not on file   Number of children: 1   Years of education: Not on file   Highest education level: Not on file  Occupational History   Occupation: retired  Tobacco Use   Smoking status: Every Day    Packs/day: 0.50    Years: 60.00    Total pack years: 30.00    Types: Cigarettes    Last attempt to quit: 11/10/2019    Years since quitting: 2.8   Smokeless tobacco: Never  Vaping Use   Vaping Use: Former  Substance and Sexual Activity    Alcohol use: Yes    Alcohol/week: 0.0 standard drinks of alcohol    Comment: yes on occassion   Drug use: No   Sexual activity: Not Currently  Other Topics Concern   Not on file  Social History Narrative   Brother in law Mr [name] (one of my patients)   Lives w/ husband       Social Determinants of Health   Financial Resource Strain: Low Risk  (03/23/2021)   Overall Financial Resource Strain (CARDIA)  Difficulty of Paying Living Expenses: Not hard at all  Food Insecurity: No Food Insecurity (03/23/2021)   Hunger Vital Sign    Worried About Running Out of Food in the Last Year: Never true    Ran Out of Food in the Last Year: Never true  Transportation Needs: No Transportation Needs (03/23/2021)   PRAPARE - Hydrologist (Medical): No    Lack of Transportation (Non-Medical): No  Physical Activity: Inactive (03/23/2021)   Exercise Vital Sign    Days of Exercise per Week: 0 days    Minutes of Exercise per Session: 0 min  Stress: Not on file  Social Connections: Unknown (10/24/2018)   Social Connection and Isolation Panel [NHANES]    Frequency of Communication with Friends and Family: Patient refused    Frequency of Social Gatherings with Friends and Family: Patient refused    Attends Religious Services: Patient refused    Active Member of Clubs or Organizations: Patient refused    Attends Archivist Meetings: Patient refused    Marital Status: Patient refused  Intimate Partner Violence: Unknown (10/24/2018)   Humiliation, Afraid, Rape, and Kick questionnaire    Fear of Current or Ex-Partner: Patient refused    Emotionally Abused: Patient refused    Physically Abused: Patient refused    Sexually Abused: Patient refused    Past Medical History, Surgical history, Social history, and Family history were reviewed and updated as appropriate.   Please see review of systems for further details on the patient's review from today.    Objective:   Physical Exam:  There were no vitals taken for this visit.  Physical Exam Neurological:     Mental Status: She is alert and oriented to person, place, and time.     Cranial Nerves: No dysarthria.  Psychiatric:        Attention and Perception: She is inattentive. She does not perceive auditory hallucinations.        Mood and Affect: Mood is anxious and depressed.        Speech: Speech normal. Speech is not slurred.        Behavior: Behavior is not slowed or aggressive. Behavior is cooperative.        Thought Content: Thought content normal. Thought content is not paranoid or delusional. Thought content does not include homicidal or suicidal ideation. Thought content does not include suicidal plan.        Cognition and Memory: Memory is impaired. She exhibits impaired recent memory.        Judgment: Judgment is not impulsive or inappropriate.     Comments:   Still depressed, anxious and less irritable.  Insight and judgment are fair Talkative Gets meds mixed up at times and changes with unclear reasons at times     Lab Review:     Component Value Date/Time   NA 135 06/15/2022 1616   NA 135 01/02/2020 1602   K 4.2 06/15/2022 1616   CL 98 06/15/2022 1616   CO2 30 06/15/2022 1616   GLUCOSE 100 (H) 06/15/2022 1616   BUN 23 06/15/2022 1616   BUN 17 01/02/2020 1602   CREATININE 1.26 (H) 06/15/2022 1616   CREATININE 0.77 07/28/2015 1805   CALCIUM 9.7 06/15/2022 1616   PROT 7.8 06/01/2022 2024   PROT 7.9 01/02/2020 1602   ALBUMIN 3.7 06/01/2022 2024   ALBUMIN 4.2 01/02/2020 1602   AST 20 06/01/2022 2024   ALT 11 06/01/2022 2024   ALKPHOS 67 06/01/2022  2024   BILITOT 0.6 06/01/2022 2024   BILITOT 0.2 01/02/2020 1602   GFRNONAA 38 (L) 06/01/2022 2024   GFRAA 58 (L) 01/02/2020 1602       Component Value Date/Time   WBC 9.2 06/01/2022 2024   RBC 4.69 06/01/2022 2024   HGB 13.6 06/01/2022 2024   HGB 11.9 01/02/2020 1602   HCT 41.4 06/01/2022 2024   HCT 36.4  01/02/2020 1602   PLT 227 06/01/2022 2024   PLT 370 01/02/2020 1602   MCV 88.3 06/01/2022 2024   MCV 90 01/02/2020 1602   MCH 29.0 06/01/2022 2024   MCHC 32.9 06/01/2022 2024   RDW 13.7 06/01/2022 2024   RDW 14.0 01/02/2020 1602   LYMPHSABS 2.6 06/26/2021 2347   LYMPHSABS 2.7 01/02/2020 1602   MONOABS 0.5 06/26/2021 2347   EOSABS 0.1 06/26/2021 2347   EOSABS 0.1 01/02/2020 1602   BASOSABS 0.0 06/26/2021 2347   BASOSABS 0.0 01/02/2020 1602    No results found for: "POCLITH", "LITHIUM"   No results found for: "PHENYTOIN", "PHENOBARB", "VALPROATE", "CBMZ"   .res Assessment: Plan:    Major depressive disorder, recurrent episode, moderate (HCC)  Generalized anxiety disorder  Delayed sleep phase syndrome  History of CVA (cerebrovascular accident)  Restless legs syndrome  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  Her chronic depression and anxiety are complicated now by double grief of loss of her sister and her husband.  She is lonely and mood affected by recent severe health problems.   Chronic depression and noncompiance with meds is major contribution to her depression bc meds won't work without consistency. Chronic issues with compliance in part DT chronic nausea. Therefore will not attempt changes today.  Disc compliance again.  Chronic N and dizziness are ongoing problems. And interfere with compliance.  Stopped lamotrigine.    Cont counseling for grief, depression, anxiety, and health crises.  Supportive therapy dealing with health problems. Try to watch more positive things and not just news.  Dizziness is likely DT prior strokes and not meds.  She reduced duloxetine to 30 mg daily . Cannot think of a sublingual antidepressant and selegiline would not be safe for her.    Had to increase ropinirole 3 mg for RLS.  This  has better controlled her restless legs.   Stop the coffee after dinner.   Disc SE incl hallucinations and dizziness.   This high dosage appears medically necessary.  Call if this fails and will consider doing something with the clonazepam either increasing it or perhaps switching to another benzodiazepine which might have less cognitive problems.  Such as lorazepam Continue clonazepam at LED at 0.25-0.5 mg BID prn anxiety. We discussed the short-term risks associated with benzodiazepines including sedation and increased fall risk among others.  Discussed long-term side effect risk including dependence, potential withdrawal symptoms, and the potential eventual dose-related risk of dementia.  But recent studies from 2020 dispute this association between benzodiazepines and dementia risk. Newer studies in 2020 do not support an association with dementia.  Emphasized importance and proper nutrition and sleep and protein.  Doesn't eat much meat bc constipation but will eat fish.  Discussed how protein is necessary for the proper response to depression medicines.  Can't cook easily.    Sleep hygiene and disc her delayed sleep phase which is chronic at this time.    No med changes are clear  to make that would work for her.   She has some difficulty understanding the med changes and keeping up with them.  Needs some frequency of visits for supportive therapy and self care direction DT ongoing medical problems  FU 3-4 mos.  Lynder Parents, MD, DFAPA  Please see After Visit Summary for patient specific instructions.  No future appointments.    No orders of the defined types were placed in this encounter.      -------------------------------

## 2022-08-29 NOTE — Telephone Encounter (Signed)
This is a notice of refusal of services by patient, placed in your review folder

## 2022-08-30 NOTE — Telephone Encounter (Signed)
Noted, signed, scan.

## 2022-10-05 ENCOUNTER — Emergency Department (HOSPITAL_COMMUNITY)
Admission: EM | Admit: 2022-10-05 | Discharge: 2022-10-05 | Disposition: A | Discharge status: Home / Self Care | Payer: Medicare Other | Attending: Emergency Medicine | Admitting: Emergency Medicine

## 2022-10-05 ENCOUNTER — Emergency Department (HOSPITAL_COMMUNITY): Payer: Medicare Other

## 2022-10-05 DIAGNOSIS — Z79899 Other long term (current) drug therapy: Secondary | ICD-10-CM | POA: Diagnosis not present

## 2022-10-05 DIAGNOSIS — I6381 Other cerebral infarction due to occlusion or stenosis of small artery: Secondary | ICD-10-CM | POA: Diagnosis not present

## 2022-10-05 DIAGNOSIS — I959 Hypotension, unspecified: Secondary | ICD-10-CM | POA: Diagnosis not present

## 2022-10-05 DIAGNOSIS — Z7901 Long term (current) use of anticoagulants: Secondary | ICD-10-CM | POA: Diagnosis not present

## 2022-10-05 DIAGNOSIS — R0602 Shortness of breath: Secondary | ICD-10-CM | POA: Insufficient documentation

## 2022-10-05 DIAGNOSIS — K209 Esophagitis, unspecified without bleeding: Secondary | ICD-10-CM

## 2022-10-05 DIAGNOSIS — R7401 Elevation of levels of liver transaminase levels: Secondary | ICD-10-CM | POA: Insufficient documentation

## 2022-10-05 DIAGNOSIS — R110 Nausea: Secondary | ICD-10-CM | POA: Diagnosis present

## 2022-10-05 DIAGNOSIS — F17210 Nicotine dependence, cigarettes, uncomplicated: Secondary | ICD-10-CM | POA: Diagnosis not present

## 2022-10-05 DIAGNOSIS — Z8673 Personal history of transient ischemic attack (TIA), and cerebral infarction without residual deficits: Secondary | ICD-10-CM | POA: Diagnosis not present

## 2022-10-05 DIAGNOSIS — R079 Chest pain, unspecified: Secondary | ICD-10-CM | POA: Insufficient documentation

## 2022-10-05 DIAGNOSIS — R791 Abnormal coagulation profile: Secondary | ICD-10-CM | POA: Diagnosis not present

## 2022-10-05 DIAGNOSIS — R569 Unspecified convulsions: Secondary | ICD-10-CM | POA: Diagnosis not present

## 2022-10-05 DIAGNOSIS — R7989 Other specified abnormal findings of blood chemistry: Secondary | ICD-10-CM | POA: Diagnosis not present

## 2022-10-05 DIAGNOSIS — R55 Syncope and collapse: Secondary | ICD-10-CM | POA: Insufficient documentation

## 2022-10-05 DIAGNOSIS — R001 Bradycardia, unspecified: Secondary | ICD-10-CM | POA: Diagnosis not present

## 2022-10-05 DIAGNOSIS — E039 Hypothyroidism, unspecified: Secondary | ICD-10-CM | POA: Insufficient documentation

## 2022-10-05 DIAGNOSIS — I2510 Atherosclerotic heart disease of native coronary artery without angina pectoris: Secondary | ICD-10-CM | POA: Insufficient documentation

## 2022-10-05 LAB — COMPREHENSIVE METABOLIC PANEL
ALT: 13 U/L (ref 0–44)
AST: 16 U/L (ref 15–41)
Albumin: 3.2 g/dL — ABNORMAL LOW (ref 3.5–5.0)
Alkaline Phosphatase: 57 U/L (ref 38–126)
Anion gap: 8 (ref 5–15)
BUN: 22 mg/dL (ref 8–23)
CO2: 27 mmol/L (ref 22–32)
Calcium: 9.1 mg/dL (ref 8.9–10.3)
Chloride: 101 mmol/L (ref 98–111)
Creatinine, Ser: 1.41 mg/dL — ABNORMAL HIGH (ref 0.44–1.00)
GFR, Estimated: 38 mL/min — ABNORMAL LOW (ref >60)
Glucose, Bld: 120 mg/dL — ABNORMAL HIGH (ref 70–99)
Potassium: 4.3 mmol/L (ref 3.5–5.1)
Sodium: 136 mmol/L (ref 135–145)
Total Bilirubin: 0.4 mg/dL (ref 0.3–1.2)
Total Protein: 6.6 g/dL (ref 6.5–8.1)

## 2022-10-05 LAB — CBC WITH DIFFERENTIAL/PLATELET
Abs Immature Granulocytes: 0.04 10*3/uL (ref 0.00–0.07)
Basophils Absolute: 0 10*3/uL (ref 0.0–0.1)
Basophils Relative: 0 %
Eosinophils Absolute: 0.1 10*3/uL (ref 0.0–0.5)
Eosinophils Relative: 1 %
HCT: 40.4 % (ref 36.0–46.0)
Hemoglobin: 13.3 g/dL (ref 12.0–15.0)
Immature Granulocytes: 1 %
Lymphocytes Relative: 15 %
Lymphs Abs: 1.3 10*3/uL (ref 0.7–4.0)
MCH: 30.2 pg (ref 26.0–34.0)
MCHC: 32.9 g/dL (ref 30.0–36.0)
MCV: 91.6 fL (ref 80.0–100.0)
Monocytes Absolute: 0.6 10*3/uL (ref 0.1–1.0)
Monocytes Relative: 7 %
Neutro Abs: 6.5 10*3/uL (ref 1.7–7.7)
Neutrophils Relative %: 76 %
Platelets: 184 10*3/uL (ref 150–400)
RBC: 4.41 MIL/uL (ref 3.87–5.11)
RDW: 14 % (ref 11.5–15.5)
WBC: 8.5 10*3/uL (ref 4.0–10.5)
nRBC: 0 % (ref 0.0–0.2)

## 2022-10-05 LAB — D-DIMER, QUANTITATIVE: D-Dimer, Quant: 2.78 ug/mL-FEU — ABNORMAL HIGH (ref 0.00–0.50)

## 2022-10-05 LAB — PROTIME-INR
INR: 1.1 (ref 0.8–1.2)
Prothrombin Time: 13.9 seconds (ref 11.4–15.2)

## 2022-10-05 LAB — TROPONIN I (HIGH SENSITIVITY)
Troponin I (High Sensitivity): 66 ng/L — ABNORMAL HIGH (ref <18)
Troponin I (High Sensitivity): 66 ng/L — ABNORMAL HIGH (ref <18)

## 2022-10-05 LAB — BRAIN NATRIURETIC PEPTIDE: B Natriuretic Peptide: 185.7 pg/mL — ABNORMAL HIGH (ref 0.0–100.0)

## 2022-10-05 MED ORDER — ONDANSETRON HCL 4 MG/2ML IJ SOLN
4.0000 mg | Freq: Once | INTRAMUSCULAR | Status: AC
  Administered 2022-10-05: 4 mg via INTRAVENOUS
  Filled 2022-10-05: qty 2

## 2022-10-05 MED ORDER — OMEPRAZOLE 20 MG PO CPDR: 20.0000 mg | DELAYED_RELEASE_CAPSULE | Freq: Every day | ORAL | 1 refills | Status: DC

## 2022-10-05 MED ORDER — IOHEXOL 350 MG/ML SOLN
65.0000 mL | Freq: Once | INTRAVENOUS | Status: AC | PRN
  Administered 2022-10-05: 65 mL via INTRAVENOUS

## 2022-10-05 MED ORDER — MORPHINE SULFATE (PF) 2 MG/ML IV SOLN
2.0000 mg | Freq: Once | INTRAVENOUS | Status: AC
  Administered 2022-10-05: 2 mg via INTRAVENOUS
  Filled 2022-10-05: qty 1

## 2022-10-05 MED ORDER — SODIUM CHLORIDE 0.9 % IV BOLUS
1000.0000 mL | Freq: Once | INTRAVENOUS | Status: AC
  Administered 2022-10-05: 1000 mL via INTRAVENOUS

## 2022-10-05 NOTE — ED Triage Notes (Signed)
Patient BIB EMS from home with c/o nausea and diaphoresis. EMS witness syncope/ seizure episode that lasted 1-2 mins. Patient stop taking all medication a few months ago. Initial SBP 90. CBG 152. 18g RAC. 744m IVF. Zofran '4mg'$ .

## 2022-10-05 NOTE — ED Notes (Signed)
Pt given something to eat and drink at bedside.

## 2022-10-05 NOTE — ED Provider Notes (Signed)
Assumed care from Dr. Truett Mainland at 3:30 PM.  Patient here after a syncopal event at home when she was complaining of some chest pain and nausea.  Patient reports she gets the chest pain frequently and it usually improves after taking a drink of water.  However today she was very nauseated and had a syncopal event for the first time.  While here she developed some severe pain in her chest which she said initially did not improve after getting a drink of water but then was starting to get better.  EKG during that time showed no acute ST changes.  Patient's troponins have been flat at 66, BNP minimally elevated at 185, CMP without acute findings and CBC within normal limits.  Patient's D-dimer was elevated however she had a CTA done which showed no evidence of PE but did show diffuse esophageal wall thickening wearing some for esophagitis and unchanged stable pulmonary nodules.  Findings were discussed with the patient.  She is not currently taking a PPI or H2 blocker regularly.  She does report she occasionally takes Pepcid.  She does not use aspirin, Aleve, ibuprofen or alcohol.  Discussed starting omeprazole for the next 2 weeks and following up.  Patient was able to stand and ambulate without recurrent symptoms.  She has not been bradycardic or hypotensive here.  She would like to go home.  She was to be given return precautions.   Blanchie Dessert, MD 10/05/22 2025

## 2022-10-05 NOTE — ED Notes (Signed)
Pt ambulated in lobby with minimal assistance, pt refusing a new set of VS to be obtained and requesting all equipment to be removed. Patient verbalizes understanding of d/c instructions. Opportunities for questions and answers were provided. Pt d/c from ED and wheeled to lobby where she is waiting for family to pick her up.

## 2022-10-05 NOTE — ED Notes (Signed)
Patient c/o severe chest pain that radiated to left arm with nausea. MD made aware. Will continue to monitor

## 2022-10-05 NOTE — Discharge Instructions (Signed)
The blood test today look okay.  No signs of heart attack and your EKG was normal.  Your CAT scan did not show any signs of blood clots but it did show that you have inflammation of your esophagus which may be causing the pain that you are experiencing in your chest.  It also may be causing the nausea.  Eat small frequent meals.  Avoid eating before you go to bed at least 2 hours before.  Avoid Advil, aspirin and alcohol.  You are given a prescription for an antacid which you need to take consistently for the next 2 weeks to see if it helps her symptoms.  If you have any further episodes of passing out you should return to the emergency room.

## 2022-10-05 NOTE — ED Provider Notes (Signed)
[facility] Provider Note  CSN: [id number] Arrival date & time: 10/05/22 1409  Chief Complaint(s) No chief complaint on file.  HPI [name] is a 77 y.o. female history of bipolar disorder, coronary artery disease, hypertension, peripheral artery disease previously on Xarelto presenting to the emergency department with syncope.  Patient reports that she woke up this morning, felt weak, sweaty, nauseous.  She called the paramedics, paramedics found that she had some hypotension in the 90s, patient initially refused transport but then witnessed to have a 1 to 2-minute likely syncopal episode, afterwards seem to return to baseline.  She was seen to be bradycardic during this episode per paramedics.  No shaking activity, may have looked to the left side.  No history of seizures.  Patient reports she feels improved currently.  No chest pain, mild shortness of breath which is improved.  No vomiting.  No abdominal pains or headaches.  Denies similar episodes in the past.   Past Medical History Past Medical History:  Diagnosis Date   AAA (abdominal aortic aneurysm) ([place])    3.1 cm 07/08/18, 3 year follow-up recommended; possible 3 cm AAA by aortogram 09/13/18   Anemia    PMH   Appendicitis with abscess    07/08/18, s/p perc drain; resolved 07/30/18 by CT   Arthritis    Bipolar disorder ([place])    Cerebrovascular disease    intra and extracranial vascular dx per MRI 4/11, neurology rec strict CVRF control   Colonic inertia    Constipation    chronic;severe   Coronary artery disease    Depression    Duodenitis    EKG abnormalities    changes, stress test neg (false EKG changes)   Gastritis    GERD (gastroesophageal reflux disease)    Hypertension    Hypothyroid 01/16/2014   Meningioma ([place]) 10/21/2013   PAD (peripheral artery disease) ([place])    Psoriasis    sees derm   Stroke [facility])    Wears dentures    Wears glasses    Patient Active  Problem List   Diagnosis Date Noted   Centrilobular emphysema ([place]) 10/19/2020   Chronic respiratory failure with hypoxia ([place]) 12/26/2019   Shortness of breath 12/26/2019   Bilateral pulmonary infiltrates on CXR    Complicated UTI (urinary tract infection) 11/11/2019   AKI (acute kidney injury) ([place]) 11/11/2019   Hyponatremia 11/11/2019   Elevated LFTs 11/11/2019   Sepsis ([place]) 11/11/2019   Wound infection 11/07/2018   Postoperative pain    Sleep disturbance    Tobacco abuse    Acute blood loss anemia    Hypoalbuminemia due to protein-calorie malnutrition ([place])    Debility 10/24/2018   Pre-operative cardiovascular examination 09/26/2018   HLD (hyperlipidemia) 09/26/2018   GAD (generalized anxiety disorder) 08/07/2018   Major depressive disorder, single episode 08/07/2018   Appendiceal abscess 07/08/2018   PVD (peripheral vascular disease) ([place]) 03/15/2018   Atherosclerosis of native artery of left lower extremity with intermittent claudication ([place]) 03/15/2018   Chronic left-sided low back pain with left-sided sciatica 12/25/2017   Facial droop    History of CVA (cerebrovascular accident) 10/23/2017   Critical lower limb ischemia ([place]) 11/08/2016   Smoker 11/08/2016   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 01/16/2014   Mild cognitive impairment 01/16/2014   Meningioma ([place]) 10/21/2013   Chest pain 10/20/2013   Medicare annual wellness visit, subsequent 06/16/2013   Dizziness and giddiness  06/16/2013   RLS (restless legs syndrome) 04/29/2012   Abdominal pain 12/27/2010   DEGENERATIVE JOINT DISEASE 09/23/2010   CAROTID ARTERY DISEASE 08/15/2010   CHEST PAIN 08/15/2010   HEADACHE 12/02/2009   BACK PAIN 11/22/2009   CONSTIPATION, CHRONIC 01/29/2008   NAUSEA 01/28/2008   DEPRESSION 03/08/2007   HTN (hypertension) 03/08/2007   Home Medication(s) Prior to Admission medications   Medication Sig Start Date End  Date Taking? Authorizing Provider  albuterol (VENTOLIN HFA) 108 (90 Base) MCG/ACT inhaler Inhale 1-2 puffs into the lungs every 6 (six) hours as needed for wheezing or shortness of breath. 06/09/22   [name], MD  amLODipine (NORVASC) 10 MG tablet 1 tablet Orally Once a day for 30 day(s)    [provider]  clonazePAM (KLONOPIN) 1 MG tablet PRN    [provider]  DULoxetine (CYMBALTA) 30 MG capsule Take 1 capsule (30 mg total) by mouth 2 (two) times daily. Patient taking differently: Take 30 mg by mouth daily. 11/03/21   Cottle, Billey Co., MD  famotidine (PEPCID) 20 MG tablet One at bedtime 02/14/16   Tanda Rockers, MD  famotidine (PEPCID) 20 MG tablet 1 tablet at bedtime as needed Orally Once a day for 30 day(s)    [provider]  lamoTRIgine (LAMICTAL) 25 MG tablet Take 3 tablets (75 mg total) by mouth daily. Patient not taking: Reported on 06/01/2022 11/03/21   Cottle, Billey Co., MD  linaclotide [facility]) 145 MCG CAPS capsule Take 1 capsule (145 mcg total) by mouth 2 (two) times daily. 07/01/21   Armbruster, Carlota Raspberry, MD  Magnesium Hydroxide (DULCOLAX PO) Take by mouth as needed.    [provider]  Melatonin 10 MG TABS Take 20 mg by mouth at bedtime.    [provider]  rOPINIRole (REQUIP) 1 MG tablet 1 tablet 1 to 3 hours before bedtime Orally Once a day for 30 day(s)    [provider]  Senna EXTR Take by mouth as needed.    [provider]  triamcinolone cream (KENALOG) 0.1 % Apply 1 Application topically 2 (two) times daily. 06/22/22   [name], MD  XARELTO 20 MG TABS tablet TAKE 1 TABLET(20 MG) BY MOUTH DAILY WITH SUPPER 10/31/21   [name], MD                                                                                                                                    Past Surgical History Past Surgical History:  Procedure Laterality Date   ABDOMINAL AORTOGRAM W/LOWER EXTREMITY N/A 09/13/2018    Procedure: ABDOMINAL AORTOGRAM W/LOWER EXTREMITY;  Surgeon: Elam Dutch, MD;  Location: [facility];  Service: Cardiovascular;  Laterality: N/A;   arthroscopy  04/2010   Right knee   CATARACT EXTRACTION W/ INTRAOCULAR LENS  IMPLANT, BILATERAL     COLONOSCOPY     FEMORAL-POPLITEAL BYPASS GRAFT  10/22/2018   FEMORAL-POPLITEAL BYPASS GRAFT Left 10/22/2018   Procedure: LEFT FEMORAL TO BELOW THE KNEE POPLITEAL ARTERY BYPASS GRAFT;  Surgeon: Elam Dutch, MD;  [facility];  Service: Vascular;  Laterality: Left;   I & D EXTREMITY Left 11/08/2018   Procedure: IRRIGATION AND DEBRIDEMENT EXTREMITY Left Leg;  Surgeon: Elam Dutch, MD;  Location: [facility];  Service: Vascular;  Laterality: Left;   IR RADIOLOGIST EVAL & MGMT  07/30/2018   MULTIPLE TOOTH EXTRACTIONS     TUBAL LIGATION     Family History Family History  Problem Relation Age of Onset   Breast cancer Mother        metastisis to bones   Cancer Sister 69       spinal   Alcohol abuse Brother    Heart disease Brother    Lung cancer Brother        smoked   Diabetes Son    Heart disease Maternal Aunt    Heart disease Other        grandfather    Colon cancer Neg Hx    Esophageal cancer Neg Hx    Stomach cancer Neg Hx     Social History Social History   Tobacco Use   Smoking status: Every Day    Packs/day: 0.50    Years: 60.00    Total pack years: 30.00    Types: Cigarettes    Last attempt to quit: 11/10/2019    Years since quitting: 2.9   Smokeless tobacco: Never  Vaping Use   Vaping Use: Former  Substance Use Topics   Alcohol use: Yes    Alcohol/week: 0.0 standard drinks of alcohol    Comment: yes on occassion   Drug use: No   Allergies Patient has no known allergies.  Review of Systems Review of Systems  All other systems reviewed and are negative.   Physical Exam Vital Signs  I have reviewed the triage vital signs BP 129/81   Pulse (!) 59   Temp 98.5 F (36.9 C)   Resp 18   SpO2  95%  Physical Exam Vitals and nursing note reviewed.  Constitutional:      General: She is not in acute distress.    Appearance: She is well-developed.  HENT:     Head: Normocephalic and atraumatic.     Mouth/Throat:     Mouth: Mucous membranes are moist.  Eyes:     Pupils: Pupils are equal, round, and reactive to light.  Cardiovascular:     Rate and Rhythm: Normal rate and regular rhythm.     Heart sounds: No murmur heard. Pulmonary:     Effort: Pulmonary effort is normal. No respiratory distress.     Breath sounds: Normal breath sounds.  Abdominal:     General: Abdomen is flat.     Palpations: Abdomen is soft.     Tenderness: There is no abdominal tenderness.  Musculoskeletal:        General: No tenderness.     Right lower leg: No edema.     Left lower leg: No edema.  Skin:    General: Skin is warm and dry.  Neurological:     General: No focal deficit present.     Mental Status: She is alert. Mental status is at baseline.  Psychiatric:        Mood and Affect: Mood normal.        Behavior: Behavior normal.     ED Results and Treatments Labs (  all labs ordered are listed, but only abnormal results are displayed) Labs Reviewed  COMPREHENSIVE METABOLIC PANEL  CBC WITH DIFFERENTIAL/PLATELET  BRAIN NATRIURETIC PEPTIDE  PROTIME-INR  D-DIMER, QUANTITATIVE  TROPONIN I (HIGH SENSITIVITY)                                                                                                                          Radiology No results found.  Pertinent labs & imaging results that were available during my care of the patient were reviewed by me and considered in my medical decision making (see MDM for details).  Medications Ordered in ED Medications - No data to display                                                                                                                                   Procedures Procedures  (including critical care time)  Medical Decision  Making / ED Course   MDM:  77 year old female presenting to the emergency department with syncopal event.  Patient currently well-appearing, physical exam nonfocal.  Vital signs with sinus bradycardia.  Differential includes cardiogenic syncope, patient was witnessed to become increasingly bradycardic during episode per EMS, also includes vasovagal syncope although unclear trigger, patient was sitting so doubt orthostatic syncope.  Low concern for seizure, no postictal state, no shaking activity witnessed, no tongue biting.  EKG without findings concerning for STEMI.  Patient feels improved in the emergency department.  Received IV fluids and Zofran with EMS.  Will check workup including basic labs, troponin, given shortness of breath associated with syncopal episode will obtain D-dimer to evaluate for PE but relatively low concern without tachycardia or hypoxia.  Will reassess.    Clinical Course as of 10/05/22 1516  Thu Oct 05, 2022  1516 Signed out to Dr. Maryan Rued pending labs, imaging [WS]    Clinical Course User Index [WS] Cristie Hem, MD     Additional history obtained: -Additional history obtained from ems -External records from outside source obtained and reviewed including: Chart review including previous notes, labs, imaging, consultation notes including behavioral health office visit 08/29/22   Lab Tests: -I ordered, reviewed, and interpreted labs.   The pertinent results include:   Labs Reviewed  COMPREHENSIVE METABOLIC PANEL  CBC WITH DIFFERENTIAL/PLATELET  BRAIN NATRIURETIC PEPTIDE  PROTIME-INR  D-DIMER, QUANTITATIVE  TROPONIN I (HIGH SENSITIVITY)      EKG   EKG Interpretation  Date/Time:  Thursday October 05 2022 14:25:49 EST Ventricular Rate:  63 PR Interval:  198 QRS Duration: 98 QT Interval:  392 QTC Calculation: 402 R Axis:   16 Text Interpretation: Sinus rhythm Biatrial enlargement LVH with secondary repolarization abnormality Anterior Q  waves, possibly due to LVH Confirmed by Garnette Gunner [id number]) on 10/05/2022 3:10:05 PM         Medicines ordered and prescription drug management: No orders of the defined types were placed in this encounter.   -I have reviewed the patients home medicines and have made adjustments as needed    Cardiac Monitoring: The patient was maintained on a cardiac monitor.  I personally viewed and interpreted the cardiac monitored which showed an underlying rhythm of: sinus bradycardia  Social Determinants of Health:  Diagnosis or treatment significantly limited by social determinants of health: former smoker   Reevaluation: After the interventions noted above, I reevaluated the patient and found that they have improved  Co morbidities that complicate the patient evaluation  Past Medical History:  Diagnosis Date   AAA (abdominal aortic aneurysm) ([place])    3.1 cm 07/08/18, 3 year follow-up recommended; possible 3 cm AAA by aortogram 09/13/18   Anemia    PMH   Appendicitis with abscess    07/08/18, s/p perc drain; resolved 07/30/18 by CT   Arthritis    Bipolar disorder ([place])    Cerebrovascular disease    intra and extracranial vascular dx per MRI 4/11, neurology rec strict CVRF control   Colonic inertia    Constipation    chronic;severe   Coronary artery disease    Depression    Duodenitis    EKG abnormalities    changes, stress test neg (false EKG changes)   Gastritis    GERD (gastroesophageal reflux disease)    Hypertension    Hypothyroid 01/16/2014   Meningioma ([place]) 10/21/2013   PAD (peripheral artery disease) ([place])    Psoriasis    sees derm   Stroke [facility])    Wears dentures    Wears glasses       Dispostion: Disposition decision including need for hospitalization was considered, and patient disposition pending at time of sign out    Final Clinical Impression(s) / ED Diagnoses Final diagnoses:  Syncope, unspecified syncope type     This chart was dictated  using voice recognition software.  Despite best efforts to proofread,  errors can occur which can change the documentation meaning.    Cristie Hem, MD 10/05/22 [phone]

## 2022-10-12 NOTE — Progress Notes (Signed)
This encounter was created in error - please disregard.

## 2022-10-16 ENCOUNTER — Other Ambulatory Visit: Payer: Self-pay | Admitting: Psychiatry

## 2022-10-16 DIAGNOSIS — G2581 Restless legs syndrome: Secondary | ICD-10-CM

## 2022-10-17 ENCOUNTER — Other Ambulatory Visit: Payer: Self-pay | Admitting: Psychiatry

## 2022-10-17 DIAGNOSIS — G2581 Restless legs syndrome: Secondary | ICD-10-CM

## 2022-10-17 NOTE — Telephone Encounter (Signed)
LVM to RC. What dose is she on?

## 2022-10-17 NOTE — Telephone Encounter (Signed)
Patient said she is taking one 2 mg tablet qd.

## 2022-10-25 DIAGNOSIS — N760 Acute vaginitis: Secondary | ICD-10-CM | POA: Diagnosis not present

## 2022-10-25 DIAGNOSIS — N390 Urinary tract infection, site not specified: Secondary | ICD-10-CM | POA: Diagnosis not present

## 2022-11-27 ENCOUNTER — Emergency Department (HOSPITAL_COMMUNITY): Payer: Medicare Other

## 2022-11-27 ENCOUNTER — Emergency Department (HOSPITAL_COMMUNITY)
Admission: EM | Admit: 2022-11-27 | Discharge: 2022-11-27 | Disposition: A | Discharge status: Home / Self Care | Payer: Medicare Other | Attending: Emergency Medicine | Admitting: Emergency Medicine

## 2022-11-27 DIAGNOSIS — R55 Syncope and collapse: Secondary | ICD-10-CM

## 2022-11-27 DIAGNOSIS — Z7901 Long term (current) use of anticoagulants: Secondary | ICD-10-CM | POA: Insufficient documentation

## 2022-11-27 DIAGNOSIS — E039 Hypothyroidism, unspecified: Secondary | ICD-10-CM | POA: Diagnosis not present

## 2022-11-27 DIAGNOSIS — Z79899 Other long term (current) drug therapy: Secondary | ICD-10-CM | POA: Insufficient documentation

## 2022-11-27 DIAGNOSIS — R61 Generalized hyperhidrosis: Secondary | ICD-10-CM | POA: Diagnosis not present

## 2022-11-27 DIAGNOSIS — R531 Weakness: Secondary | ICD-10-CM | POA: Diagnosis not present

## 2022-11-27 DIAGNOSIS — R110 Nausea: Secondary | ICD-10-CM | POA: Diagnosis not present

## 2022-11-27 DIAGNOSIS — Z720 Tobacco use: Secondary | ICD-10-CM | POA: Diagnosis not present

## 2022-11-27 DIAGNOSIS — I10 Essential (primary) hypertension: Secondary | ICD-10-CM | POA: Diagnosis not present

## 2022-11-27 LAB — CBC WITH DIFFERENTIAL/PLATELET
Abs Immature Granulocytes: 0.04 10*3/uL (ref 0.00–0.07)
Basophils Absolute: 0 10*3/uL (ref 0.0–0.1)
Basophils Relative: 1 %
Eosinophils Absolute: 0.1 10*3/uL (ref 0.0–0.5)
Eosinophils Relative: 1 %
HCT: 43.1 % (ref 36.0–46.0)
Hemoglobin: 13.6 g/dL (ref 12.0–15.0)
Immature Granulocytes: 1 %
Lymphocytes Relative: 19 %
Lymphs Abs: 1.2 10*3/uL (ref 0.7–4.0)
MCH: 29.1 pg (ref 26.0–34.0)
MCHC: 31.6 g/dL (ref 30.0–36.0)
MCV: 92.3 fL (ref 80.0–100.0)
Monocytes Absolute: 0.5 10*3/uL (ref 0.1–1.0)
Monocytes Relative: 8 %
Neutro Abs: 4.4 10*3/uL (ref 1.7–7.7)
Neutrophils Relative %: 70 %
Platelets: 212 10*3/uL (ref 150–400)
RBC: 4.67 MIL/uL (ref 3.87–5.11)
RDW: 13.7 % (ref 11.5–15.5)
WBC: 6.3 10*3/uL (ref 4.0–10.5)
nRBC: 0 % (ref 0.0–0.2)

## 2022-11-27 LAB — URINALYSIS, ROUTINE W REFLEX MICROSCOPIC
Bilirubin Urine: NEGATIVE
Glucose, UA: NEGATIVE mg/dL
Hgb urine dipstick: NEGATIVE
Ketones, ur: NEGATIVE mg/dL
Leukocytes,Ua: NEGATIVE
Nitrite: NEGATIVE
Protein, ur: NEGATIVE mg/dL
Specific Gravity, Urine: 1.008 (ref 1.005–1.030)
pH: 7 (ref 5.0–8.0)

## 2022-11-27 LAB — TROPONIN I (HIGH SENSITIVITY)
Troponin I (High Sensitivity): 25 ng/L — ABNORMAL HIGH (ref <18)
Troponin I (High Sensitivity): 26 ng/L — ABNORMAL HIGH (ref <18)

## 2022-11-27 LAB — COMPREHENSIVE METABOLIC PANEL
ALT: 12 U/L (ref 0–44)
AST: 16 U/L (ref 15–41)
Albumin: 3.5 g/dL (ref 3.5–5.0)
Alkaline Phosphatase: 67 U/L (ref 38–126)
Anion gap: 9 (ref 5–15)
BUN: 25 mg/dL — ABNORMAL HIGH (ref 8–23)
CO2: 23 mmol/L (ref 22–32)
Calcium: 9.1 mg/dL (ref 8.9–10.3)
Chloride: 103 mmol/L (ref 98–111)
Creatinine, Ser: 1.26 mg/dL — ABNORMAL HIGH (ref 0.44–1.00)
GFR, Estimated: 43 mL/min — ABNORMAL LOW (ref >60)
Glucose, Bld: 111 mg/dL — ABNORMAL HIGH (ref 70–99)
Potassium: 3.8 mmol/L (ref 3.5–5.1)
Sodium: 135 mmol/L (ref 135–145)
Total Bilirubin: 0.4 mg/dL (ref 0.3–1.2)
Total Protein: 7.2 g/dL (ref 6.5–8.1)

## 2022-11-27 MED ORDER — SODIUM CHLORIDE 0.9 % IV BOLUS
1000.0000 mL | Freq: Once | INTRAVENOUS | Status: AC
  Administered 2022-11-27: 1000 mL via INTRAVENOUS

## 2022-11-27 MED ORDER — SODIUM CHLORIDE 0.9 % IV SOLN: INTRAVENOUS | Status: DC

## 2022-11-27 NOTE — Discharge Instructions (Addendum)
Assisted Living Facilities [facility] at [facility] [address] [place], [place] [address] [phone]   [facility] at [facility] [address], [place], [place] [address] [phone]   [facility] [address] Madelaine Bhat [place], [place] [phone] [place], [place], [place] [address] [phone]   [facility] [address], [place], [place] [address] [phone]   [facility] [place], [place], [place] [address] [phone] [address], [place], [place] [address] [phone]   [facility] [place], [place], [place] [address] [phone]   [facility] [address] [place], [place] [address] [phone]   [facility]. [place], [place], [place] [address] [phone]   [facility] at [facility] [address], [place], [place] [address] [phone]  [place] [address], [place], [place] [address] [phone]   [facility] [place], [place], [place] [address] [phone]   [facility] [address] [place], [place] [address] [phone] [place], [place] [address] [phone]  [facility] [address] [place], [place] [address] [phone]  [facility] [address] [place], [place] [address] [phone]  [facility] [place], [place] [address] [phone]  [facility] [place], [place], [place] [address] [phone]    [facility] Address: [place], [place], [place] [address] Phone: [phone]  [facility] Address: [address], [place], [place] [address] Phone: [phone]  [facility] Address: [place], [place], [place] [address] Phone: [phone]  [facility] Address: [address] [place], [place] [address] Phone: [phone]  [facility] Address: [address] [place], [place],  [address] Phone: [phone]  [facility] Phone: [phone]  [facility] Address:  [id number], [place],  [address] Phone:  [phone]  [facility] Address:  [address] Phone:  [phone]  [email or web address]  [facility] [employer] Phone: [phone]    RENTING Burns

## 2022-11-27 NOTE — ED Triage Notes (Signed)
Pt arrives via [facility] from home for a near syncopal episode after working outside. Pt reports getting diaphoretic, SOB, nauseas, and feeling "an impending sense of doom". Pt denies any CP during triage, and states symptoms have mostly resolved.

## 2022-11-27 NOTE — ED Notes (Signed)
Attempted to draw second troponin, patient line did not pull back. RN explained we may need to straight stick for blood draw and patient reports that she will not allow it. MD made aware.

## 2022-11-27 NOTE — ED Provider Notes (Signed)
[facility] Provider Note   CSN: [id number] Arrival date & time: 11/27/22  1104     History  Chief Complaint  Patient presents with   Near Syncope    [name] is a 77 y.o. female.  Pt is a 77 yo female with pmhx significant for htn, constipation, depression, psoriasis, bipolar d/o, GERD, hypothyroidism, cva, AAA, PAD, and arthritis.  Pt said she was smoking outside.  She got up and felt terrible.  She got very sweaty.  She did call EMS who said all vitals were normal.  She is feeling better now.       Home Medications Prior to Admission medications   Medication Sig Start Date End Date Taking? Authorizing Provider  rOPINIRole (REQUIP) 2 MG tablet TAKE 1 TABLET(2 MG) BY MOUTH AT BEDTIME 10/17/22   Cottle, Billey Co., MD  albuterol (VENTOLIN HFA) 108 (90 Base) MCG/ACT inhaler Inhale 1-2 puffs into the lungs every 6 (six) hours as needed for wheezing or shortness of breath. 06/09/22   [name], MD  amLODipine (NORVASC) 10 MG tablet 1 tablet Orally Once a day for 30 day(s)    [provider]  clonazePAM (KLONOPIN) 1 MG tablet PRN    [provider]  DULoxetine (CYMBALTA) 30 MG capsule Take 1 capsule (30 mg total) by mouth 2 (two) times daily. Patient taking differently: Take 30 mg by mouth daily. 11/03/21   Cottle, Billey Co., MD  famotidine (PEPCID) 20 MG tablet One at bedtime 02/14/16   Tanda Rockers, MD  famotidine (PEPCID) 20 MG tablet 1 tablet at bedtime as needed Orally Once a day for 30 day(s)    [provider]  lamoTRIgine (LAMICTAL) 25 MG tablet Take 3 tablets (75 mg total) by mouth daily. Patient not taking: Reported on 06/01/2022 11/03/21   Cottle, Billey Co., MD  linaclotide [facility]) 145 MCG CAPS capsule Take 1 capsule (145 mcg total) by mouth 2 (two) times daily. 07/01/21   Armbruster, Carlota Raspberry, MD  Magnesium Hydroxide (DULCOLAX PO) Take by mouth as needed.    [provider]   Melatonin 10 MG TABS Take 20 mg by mouth at bedtime.    [provider]  omeprazole (PRILOSEC) 20 MG capsule Take 1 capsule (20 mg total) by mouth daily. 10/05/22   Blanchie Dessert, MD  rOPINIRole (REQUIP) 1 MG tablet 1 tablet 1 to 3 hours before bedtime Orally Once a day for 30 day(s)    [provider]  Senna EXTR Take by mouth as needed.    [provider]  triamcinolone cream (KENALOG) 0.1 % Apply 1 Application topically 2 (two) times daily. 06/22/22   [name], MD  XARELTO 20 MG TABS tablet TAKE 1 TABLET(20 MG) BY MOUTH DAILY WITH SUPPER 10/31/21   [name], MD      Allergies    Patient has no known allergies.    Review of Systems   Review of Systems  Neurological:  Positive for syncope.  All other systems reviewed and are negative.   Physical Exam Updated Vital Signs BP (!) 173/91   Pulse 63   Temp 97.8 F (36.6 C) (Oral)   Resp 18   SpO2 100%  Physical Exam Vitals and nursing note reviewed.  Constitutional:      Appearance: Normal appearance.  HENT:     Head: Normocephalic and atraumatic.     Right Ear: External ear normal.  Left Ear: External ear normal.     Nose: Nose normal.     Mouth/Throat:     Mouth: Mucous membranes are moist.     Pharynx: Oropharynx is clear.  Eyes:     Conjunctiva/sclera: Conjunctivae normal.     Pupils: Pupils are equal, round, and reactive to light.  Cardiovascular:     Rate and Rhythm: Normal rate and regular rhythm.     Pulses: Normal pulses.     Heart sounds: Normal heart sounds.  Pulmonary:     Effort: Pulmonary effort is normal.     Breath sounds: Normal breath sounds.  Abdominal:     General: Abdomen is flat. Bowel sounds are normal.     Palpations: Abdomen is soft.  Musculoskeletal:        General: Normal range of motion.     Cervical back: Normal range of motion and neck supple.  Skin:    General: Skin is warm.     Capillary Refill: Capillary refill takes less than 2  seconds.  Neurological:     General: No focal deficit present.     Mental Status: She is alert and oriented to person, place, and time.  Psychiatric:        Mood and Affect: Mood normal.        Behavior: Behavior normal.     ED Results / Procedures / Treatments   Labs (all labs ordered are listed, but only abnormal results are displayed) Labs Reviewed  COMPREHENSIVE METABOLIC PANEL - Abnormal; Notable for the following components:      Result Value   Glucose, Bld 111 (*)    BUN 25 (*)    Creatinine, Ser 1.26 (*)    GFR, Estimated 43 (*)    All other components within normal limits  URINALYSIS, ROUTINE W REFLEX MICROSCOPIC - Abnormal; Notable for the following components:   Color, Urine STRAW (*)    All other components within normal limits  TROPONIN I (HIGH SENSITIVITY) - Abnormal; Notable for the following components:   Troponin I (High Sensitivity) 25 (*)    All other components within normal limits  TROPONIN I (HIGH SENSITIVITY) - Abnormal; Notable for the following components:   Troponin I (High Sensitivity) 26 (*)    All other components within normal limits  CBC WITH DIFFERENTIAL/PLATELET  CBG MONITORING, ED    EKG EKG Interpretation  Date/Time:  Monday November 27 2022 11:11:26 EDT Ventricular Rate:  64 PR Interval:  181 QRS Duration: 108 QT Interval:  400 QTC Calculation: 413 R Axis:   -32 Text Interpretation: Sinus rhythm Probable left atrial enlargement Incomplete left bundle branch block Inferior infarct, old Borderline ST elevation, anterolateral leads No significant change since last tracing Confirmed by Isla Pence [phone]) on 11/27/2022 11:17:27 AM  Radiology DG Chest Port 1 View  Result Date: 11/27/2022 CLINICAL DATA:  Syncope EXAM: PORTABLE CHEST 1 VIEW COMPARISON:  10/05/2022 x-ray and older.  CT angiogram from same day FINDINGS: Diffuse interstitial lung changes identified consistent with known chronic lung disease. Please correlate for areas of  scarring and fibrosis. Enlarged cardiopericardial silhouette with calcified aorta. No secondary consolidation, pneumothorax, effusion or edema. Overlapping cardiac leads. Degenerative changes of the shoulders. IMPRESSION: Chronic lung changes.  No acute cardiopulmonary disease. Electronically Signed   By: Jill Side M.D.   On: 11/27/2022 11:26    Procedures Procedures    Medications Ordered in ED Medications  sodium chloride 0.9 % bolus 1,000 mL (0 mLs Intravenous Stopped 11/27/22 1258)  And  0.9 %  sodium chloride infusion ( Intravenous New Bag/Given 11/27/22 1302)    ED Course/ Medical Decision Making/ A&P                             Medical Decision Making Amount and/or Complexity of Data Reviewed Labs: ordered. Radiology: ordered. ECG/medicine tests: ordered.  Risk Prescription drug management.   This patient presents to the ED for concern of near syncope, this involves an extensive number of treatment options, and is a complaint that carries with it a high risk of complications and morbidity.  The differential diagnosis includes cardiac, orthostatic, vasogenic   Co morbidities that complicate the patient evaluation  htn, constipation, depression, psoriasis, bipolar d/o, GERD, hypothyroidism, cva, AAA, PAD, and arthritis   Additional history obtained:  Additional history obtained from epic chart review External records from outside source obtained and reviewed including EMS report   Lab Tests:  I Ordered, and personally interpreted labs.  The pertinent results include:  cbc nl, cmp nl, ua nl   Imaging Studies ordered:  I ordered imaging studies including cxr  I independently visualized and interpreted imaging which showed Chronic lung changes.  No acute cardiopulmonary disease.  I agree with the radiologist interpretation   Cardiac Monitoring:  The patient was maintained on a cardiac monitor.  I personally viewed and interpreted the cardiac monitored which  showed an underlying rhythm of: nsr   Medicines ordered and prescription drug management:  I ordered medication including ivfs  for near syncope  Reevaluation of the patient after these medicines showed that the patient improved I have reviewed the patients home medicines and have made adjustments as needed   Test Considered:  ct   Problem List / ED Course:  Near-syncope:  pt feels better after fluids.  Sx started after standing up.  She is encouraged to increase fluid intake.  She is encouraged to stop smoking.  She is referred to cards to check for other etiologies.  Pt is to return if worse.  F/u with pcp as well. Esophageal inflammation on CT in Jan:  pt never followed up with GI.  She sees Dr. Havery Moros.  She is to f/u with him.     Reevaluation:  After the interventions noted above, I reevaluated the patient and found that they have :improved   Social Determinants of Health:  Lives at home   Dispostion:  After consideration of the diagnostic results and the patients response to treatment, I feel that the patent would benefit from discharge with outpatient f/u.          Final Clinical Impression(s) / ED Diagnoses Final diagnoses:  Near syncope  Tobacco abuse    Rx / DC Orders ED Discharge Orders          Ordered    Ambulatory referral to Cardiology        11/27/22 1408              Isla Pence, MD 11/27/22 1543

## 2022-11-27 NOTE — Progress Notes (Signed)
CSW spoke with patient who is interested in assisted living living in the future. Patient stated she doesn't know how long she can stay in her home. Patients states she receives SSI but its becoming an issue to pay the bills. Patient stated at this time she doesn't qualify for medicaid but she might in the future. Patient stated living with her son is not an option. Patients son lives in [place]. 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 Patient stated she thought about renting out her home. CSW told patient to be careful because she doesn't want someone in her home that she can't get out. Patient stated she was worried about that. CSW told patient she may want to consider travelling nurses or social workers to rent out her home to. CSW told patient that might be safer. CSW will provide patient with a list of assisted living facilities and private duty aides.

## 2022-11-27 NOTE — ED Notes (Signed)
Patient encouraged to use [facility] when time permits. Patient stated that she does not want to use it. Will provide bedside commode instead.

## 2022-12-04 ENCOUNTER — Telehealth: Payer: Self-pay

## 2022-12-04 ENCOUNTER — Ambulatory Visit: Payer: Medicare Other | Admitting: Family Medicine

## 2022-12-04 NOTE — Telephone Encounter (Signed)
        Patient  visited [facility] on 3/18     Telephone encounter attempt :  1st  A HIPAA compliant voice message was left requesting a return call.  Instructed patient to call back .    [facility] [phone] [address], [place], [place] [address] Phone: [phone] Email: [email or web address]

## 2022-12-05 ENCOUNTER — Ambulatory Visit (INDEPENDENT_AMBULATORY_CARE_PROVIDER_SITE_OTHER): Payer: Medicare Other | Admitting: Psychiatry

## 2022-12-05 ENCOUNTER — Telehealth: Payer: Self-pay

## 2022-12-05 ENCOUNTER — Encounter: Payer: Self-pay | Admitting: Psychiatry

## 2022-12-05 DIAGNOSIS — Z8673 Personal history of transient ischemic attack (TIA), and cerebral infarction without residual deficits: Secondary | ICD-10-CM | POA: Diagnosis not present

## 2022-12-05 DIAGNOSIS — G3184 Mild cognitive impairment, so stated: Secondary | ICD-10-CM

## 2022-12-05 DIAGNOSIS — G2581 Restless legs syndrome: Secondary | ICD-10-CM

## 2022-12-05 DIAGNOSIS — Z91148 Patient's other noncompliance with medication regimen for other reason: Secondary | ICD-10-CM

## 2022-12-05 DIAGNOSIS — F331 Major depressive disorder, recurrent, moderate: Secondary | ICD-10-CM

## 2022-12-05 DIAGNOSIS — F411 Generalized anxiety disorder: Secondary | ICD-10-CM

## 2022-12-05 DIAGNOSIS — G4721 Circadian rhythm sleep disorder, delayed sleep phase type: Secondary | ICD-10-CM

## 2022-12-05 MED ORDER — ASENAPINE MALEATE 2.5 MG SL SUBL: 2.5000 mg | SUBLINGUAL_TABLET | Freq: Every evening | SUBLINGUAL | 1 refills | Status: DC

## 2022-12-05 NOTE — Progress Notes (Signed)
[name] PT:[id number] 1946/02/24 77 y.o.   Virtual Visit via Telephone Note  I connected with pt by telephone and verified that I am speaking with the correct person using two identifiers.   I discussed the limitations, risks, security and privacy concerns of performing an evaluation and management service by telephone and the availability of in person appointments. I also discussed with the patient that there may be a patient responsible charge related to this service. The patient expressed understanding and agreed to proceed.  I discussed the assessment and treatment plan with the patient. The patient was provided an opportunity to ask questions and all were answered. The patient agreed with the plan and demonstrated an understanding of the instructions.   The patient was advised to call back or seek an in-person evaluation if the symptoms worsen or if the condition fails to improve as anticipated.  I provided 30 minutes of non-face-to-face time during this encounter. The patient was located at home and the provider was located office. Session from 300-330 Televisit bc pt cannot drive bc of eyes  Subjective:   Patient ID:  [name] is a 77 y.o. (DOB 08-17-1946) female.  Chief Complaint:  Chief Complaint  Patient presents with   Follow-up   Depression   Anxiety   Fatigue   Sleeping Problem    Depression        Associated symptoms include decreased concentration and fatigue.  Associated symptoms include no suicidal ideas.  Past medical history includes anxiety.   Anxiety Symptoms include decreased concentration, dizziness, nausea, nervous/anxious behavior and shortness of breath. Patient reports no confusion, palpitations or suicidal ideas.     [name] presents to the office today for follow-up of depression and anxiety and MCI.    at visit January 09, 2019.  She was having problems with restless legs and ropinirole was increased to 3 mg every afternoon.  There  were no other med changes.  visit Feb 05, 2019.  She was having mixture of depression and anxiety and chronic insomnia with delayed sleep phase.  For the reasons mentioned at the last visit she was started on risperidone 0.25 mg nightly.  She was maintained on duloxetine 90 mg, clonazepam 1 mg nightly, lamotrigine 150 twice daily, and ropinirole 2 mg 1-1/2 tablets nightly for restless legs.  visit September 2020.  She had stopped the risperidone because she only took it a few days.  She felt nausea and spaced out on it.  No other meds were changed.  Last visit December 2020.  The following was noted: Doing very poorly.  Not getting dressed and not out of the house for 5 days.  Hard to walk with pain.  Falling apart overnight.  Feet hurt at night.  Worries over poor circulation to toes.  Placed on blood thinners.  Continued health problems with COPD.  Energy is low and hard time walking.  Arthritis in back.  House on the market and that's stressful.  Sx largely unchanged but "it's circumstances".  Planning to move to [facility] but ambivalent.  Brother in law lives next door and wants her to stay here.  Also stressed over politics and social things.   Had period of crippling nausea and cut all meds back in 1/2.  Thinks it's mental issue.  Nausea is some better with dose reduction but is more depressed.  Plan: Use pill box to improve compliance. DT nausea  split meds but increase duloxetine 30 mg BID and lamotrigine 75  mg BID. She's been on lower dose DT nausea but is more depressed.  January 02, 2020 appointment, the following is noted: She got septic with a UTI and then was admitted to [name]. Home now.  Getting stronger.  Having to take nausea meds. Also diarrhea.  Would like to be on higher dose of meds but can't tolerate it.  Still on some prednisone.  Pending cardiology with CHF.  Also pulmonology. Sleep is pretty good with meds.   No meds changed.  04/26/20 appt with the following  noted:  Missed a lot of med DT nausea.  Back on duloxetine 60 and lamotrigine 75 mg for a week.  Was more depressed and dizzy off the meds. Nausea comes and goes in waves but could be any time of day but maybe worse in the evening. Eats poorly usually.  If feels bad doesn't want to cook.   Pepcid helps nausea.  Zofran doesn't help. Lately more trouble falling asleep.  Plan: Rec trial mirtazapine 30 mg HS to help with depression and potentially nausea hopefully.  Nausea is interfering with depression treatment.  08/04/20 appt with following noted: Took mirtazapine 30 mg for a week and felt like a zombie with jitteriness outside.  Didn't help nausea much.  Noticing a pattern with nausea related to constipation and bowel problems.  Not ready to take lamotrigine bc it's hard to swallow but felt emotionally better when on it.  Wonders if bipolar bc jumps from topic to topic and talks a lot. Wants to stay on clonazepam to help her sleep.  Aware of memory concerns. Not taking lorazepam but not sure why she isn't.  Thinks it doesn't work as well as clonazepam.  Says she's taking clonazepam at night now. Plan;  Restart lamotrigine at 25 mg and use the small tablets to try to help mood and make it easier to swallow.  Purpose to help mood.  She felt it helped. Will leave duloxetine at 30 low dose. DC mirtazapine DT SE  10/11/2020 appointment with the following noted:  Started and stopped lamotrigine bc not sticking with schedule so just up to lamotrigine 50 mg daily. RLS requires ropinirole.  Sometimes forgets it and has to keep moving.    Periods of being horrible condition with depression but not suicidal.  Hasnt' been that bad lately.  Still grieving [name] and her sister.  Taking a long time.  Watch too much TV.  Sister [name] was her best friend.   Sometimes feels her presence. No energy to move back to [place] but son encourages it.  Procrastinates it bc moving is overwhelming.  Eating OK.  Enjoyed  seeing son and gkids.   Nausea better but comes and goes. Plan: Continue lamotrigine increase to 100 mg as recommended and use the small tablets to try to help mood and make it easier to swallow.  Purpose to help mood.  She felt it helped.  01/03/2021 appointment with following noted: Up some times and down some times.  Manages.  Kind of average. Not as depressed as in the past.  Still getting dizzy easily.  Sleep hours getting worse instead of bettter.  Once of twice weekly stays up all night and then naps.  Doesn't work out well for her body. Still tends to nausea but variable regardless of what she takes.  Most days consistent with lamotrigine.  Easier to swallow the small lamotrigine 25 vs 100 mg tablets. Don't feel the same since the Sepsis. Going to  get driver's license renewed tomorrow. RLS managed with ropinirole.   Sleep pretty good, except irregular hours.Pt reports that mood is Depressed worse at night and describes anxiety as worse and moderate in the same dose for depression but comes and goes.  She wonders if she might be bipolar like her sister.She is also having problems with irritability. Anxiety symptoms include: Excessive Worry, Panic Symptoms,. Pt reports sleep is more regular and consistent with melatonin 10 mg nightly.. Pt reports that appetite is decreased. Pt reports that energy is poor and loss of interest or pleasure in usual activities, poor motivation and withdrawn from usual activities. Concentration is scattered.  Suicidal thoughts:  denied by patient. B In Law next door.   Increased ropinirole helped RLS. Plan: No med changes  04/12/2021 appointment with the following noted: Had to stop driving bc of dizzy.  Eyes flutter.  Balance not always good.  Back pain. Fighting depression.  Fight with [place] over her moving to [place] and doesn't want to help. Still in [place].  Was hoping to move back to [place]. Lonely here and would like support of family. Otherwise feels better than last  year.  Needs son to help her move there and doesn't understand why. Not profoundly depressed but is down.   Lately increased clonazepam to 0.5 mg just at night for anxiety and helps her sleep better and have less anxious thoughts.   Not great sleep.  Eats frozen foods. B in law helps her. Wants to increase duloxetine to 60 mg daily but split dose bc of nausea easily. Plan: Wants to increase duloxetine to 60 mg daily but split dose bc of nausea easily.  06/16/2021 appt noted: Increased duloxetine to 60 mg daily and thinks it helped a little better.  But depression worse with family stress going on.  Son gets mad at her and yells.  Keeping GS from her and this is upsetting.   Can talk with him over the phone.  D-in-law keeping GS from her and it's wrong.  Son blames her but that's not true.   Doesn't think she's strong enough to pack up and move to [place].  Chronic dizziness and risk of falling.  Shuffles too much.   She does get online socially.   Pays attention to the news.   Satisfied with meds. Drinks diet Pepsi and coffee up until dinner. Plan: no med changes  11/03/21 appt noted: Hard to walk and probably needs TKR but no one to take care of her locally. Dropped duloxetine to 30 mg daily and lamotrigine to 25 mg 2 daily. Still on ropinirole 2 mg PM,  clonazepam 0.5 mg BID  No SE Anxiety cycles but lately it has calmed down.   Son visits from [place] every 6 weeks which helps her a lot.  Occ brings 79 yo GS. Other 2 GD are married. Was very close to her sister who has been dead 81 years and still grieving.  Her H still helps her with chores. H passed 3 years ago. Falls asleep on couch and sometimes not in bed.  Pattern irregular chronically. Neck pain. RLS managed if taken early enough.  01/17/22 appt noted: Son comes to check on her every 6 weeks.   Recognizes missing meds bc hates swallowing anything.  Also forgets things.   Chronic insomnia and disrupted cycle since lost her H and  then her longterm hosp with illness. Can't drive DT eyesight. B in law will driver her places and still helps her. Ongoing  depression.  Not sure how much longer she can stay at home and care for herself.  Weaker and balance not great.  Simple things like folding clothes is harder.   Chronic nausea. Overall satisfied with meds when takes them properly and doesn't really want to change.  Fear of fire.  08/29/22 appt noted: About the same.  Hard time swallowing meds. BC chronic nausea.  Stomach very sensitive and it has been since a little girl.   Kind of housebound.   Need something for depression and without antideprssants it is worse.  Depression is not severe all day long.  Worse at night.  Watches TV constantly to take her mind off troublesome thoughts.   Really upset over politics and her son has become Republican and that makes her feel terrible.  He swears at her and tells her she's a bad mother.   Taking duloxetine 30 mg usually HS.  No lamotrigine.  Still on clonazepam 1 mg HS, ropinirole 1 mg PM. Doesn't think she could use liquid antidepressants.   She doesn't have SE with clonazepam 1 mg HS.  Not over sedated with it or balance any worse.   Not so angry now but has been at times.    12/05/22 appt noted: Been to ER twice this month for the same thing.  Lost strength suddenly and then body got hot.  Passed out.  Strange emotional feeling of being in pain but not real pain.  Sense of doom lasting 45 mins.  No trigger for the first episode but after argument with son the second time.  No history of panic. Really can't go places bc she is weak.   Missing some meds DT nausea.  Leading to more depression. Son comes monthly for 4 days.  Sometimes it is good and other times not.  Can't live with son bc of his wife's problems. Working on getting caregiver.   Her sister had bipolar disorder.  Past Psychiatric Medication Trials: Lithium 300 insomnia, Depakote, olanzapine 2.5,  lamotrigine  300, ropinirole as high as 2 mg 3 times daily for restless legs,  Aricept poorly tolerated,  Abilify side effects,  Fluoxetine history poop out, sertraline, paroxetine, Lexapro, duloxetine 90,  Mirtazapine 30 zombie Donepezil SE clonazepam Long psychiatric history.  Sister bipolar. I have never prescribed lorazepam nor Xanax  Review of Systems:  Review of Systems  Constitutional:  Positive for fatigue.  Eyes:  Positive for visual disturbance.  Respiratory:  Positive for shortness of breath.   Cardiovascular:  Negative for palpitations.       Claudication   Gastrointestinal:  Positive for nausea. Negative for abdominal pain.  Musculoskeletal:  Positive for back pain, gait problem and neck pain.  Neurological:  Positive for dizziness and weakness. Negative for tremors.  Psychiatric/Behavioral:  Positive for decreased concentration, dysphoric mood and sleep disturbance. Negative for agitation, behavioral problems, confusion, hallucinations, self-injury and suicidal ideas. The patient is nervous/anxious. The patient is not hyperactive.    LEG surgery for claudication Jan 3  Medications: I have reviewed the patient's current medications.  Current Outpatient Medications  Medication Sig Dispense Refill   albuterol (VENTOLIN HFA) 108 (90 Base) MCG/ACT inhaler Inhale 1-2 puffs into the lungs every 6 (six) hours as needed for wheezing or shortness of breath. 18 g 2   amLODipine (NORVASC) 10 MG tablet 1 tablet Orally Once a day for 30 day(s)     Asenapine Maleate (SAPHRIS) 2.5 MG SUBL Place 1 tablet (2.5 mg total) under the tongue  at bedtime. 30 tablet 1   clonazePAM (KLONOPIN) 1 MG tablet PRN     DULoxetine (CYMBALTA) 30 MG capsule Take 1 capsule (30 mg total) by mouth 2 (two) times daily. (Patient taking differently: Take 30 mg by mouth daily.) 180 capsule 1   famotidine (PEPCID) 20 MG tablet One at bedtime 30 tablet 11   famotidine (PEPCID) 20 MG tablet 1 tablet at bedtime as needed  Orally Once a day for 30 day(s)     linaclotide (LINZESS) 145 MCG CAPS capsule Take 1 capsule (145 mcg total) by mouth 2 (two) times daily. 60 capsule 3   Magnesium Hydroxide (DULCOLAX PO) Take by mouth as needed.     Melatonin 10 MG TABS Take 20 mg by mouth at bedtime.     omeprazole (PRILOSEC) 20 MG capsule Take 1 capsule (20 mg total) by mouth daily. 14 capsule 1   rOPINIRole (REQUIP) 1 MG tablet 1 tablet 1 to 3 hours before bedtime Orally Once a day for 30 day(s)     rOPINIRole (REQUIP) 2 MG tablet TAKE 1 TABLET(2 MG) BY MOUTH AT BEDTIME 60 tablet 0   Senna EXTR Take by mouth as needed.     triamcinolone cream (KENALOG) 0.1 % Apply 1 Application topically 2 (two) times daily. 80 g 0   XARELTO 20 MG TABS tablet TAKE 1 TABLET(20 MG) BY MOUTH DAILY WITH SUPPER 30 tablet 11   lamoTRIgine (LAMICTAL) 25 MG tablet Take 3 tablets (75 mg total) by mouth daily. (Patient not taking: Reported on 12/05/2022) 270 tablet 1   No current facility-administered medications for this visit.    Medication Side Effects: None unless nausea  Allergies: No Known Allergies  Past Medical History:  Diagnosis Date   AAA (abdominal aortic aneurysm) (HCC)    3.1 cm 07/08/18, 3 year follow-up recommended; possible 3 cm AAA by aortogram 09/13/18   Anemia    PMH   Appendicitis with abscess    07/08/18, s/p perc drain; resolved 07/30/18 by CT   Arthritis    Bipolar disorder ([place])    Cerebrovascular disease    intra and extracranial vascular dx per MRI 4/11, neurology rec strict CVRF control   Colonic inertia    Constipation    chronic;severe   Coronary artery disease    Depression    Duodenitis    EKG abnormalities    changes, stress test neg (false EKG changes)   Gastritis    GERD (gastroesophageal reflux disease)    Hypertension    Hypothyroid 01/16/2014   Meningioma ([place]) 10/21/2013   PAD (peripheral artery disease) ([place])    Psoriasis    sees derm   Stroke [facility])    Wears dentures    Wears glasses      Family History  Problem Relation Age of Onset   Breast cancer Mother        metastisis to bones   Cancer Sister 15       spinal   Alcohol abuse Brother    Heart disease Brother    Lung cancer Brother        smoked   Diabetes Son    Heart disease Maternal Aunt    Heart disease Other        grandfather    Colon cancer Neg Hx    Esophageal cancer Neg Hx    Stomach cancer Neg Hx     Social History   Socioeconomic History   Marital status: Widowed    Spouse name: Not  on file   Number of children: 1   Years of education: Not on file   Highest education level: Not on file  Occupational History   Occupation: retired  Tobacco Use   Smoking status: Every Day    Packs/day: 0.50    Years: 60.00    Additional pack years: 0.00    Total pack years: 30.00    Types: Cigarettes    Last attempt to quit: 11/10/2019    Years since quitting: 3.0   Smokeless tobacco: Never  Vaping Use   Vaping Use: Former  Substance and Sexual Activity   Alcohol use: Yes    Alcohol/week: 0.0 standard drinks of alcohol    Comment: yes on occassion   Drug use: No   Sexual activity: Not Currently  Other Topics Concern   Not on file  Social History Narrative   Brother in law Mr Rexford Maus (one of my patients)   Lives w/ husband       Social Determinants of Health   Financial Resource Strain: Low Risk  (03/23/2021)   Overall Financial Resource Strain (CARDIA)    Difficulty of Paying Living Expenses: Not hard at all  Food Insecurity: No Food Insecurity (03/23/2021)   Hunger Vital Sign    Worried About Running Out of Food in the Last Year: Never true    [place] in the Last Year: Never true  Transportation Needs: No Transportation Needs (03/23/2021)   PRAPARE - Hydrologist (Medical): No    Lack of Transportation (Non-Medical): No  Physical Activity: Inactive (03/23/2021)   Exercise Vital Sign    Days of Exercise per Week: 0 days    Minutes of Exercise per  Session: 0 min  Stress: Not on file  Social Connections: Unknown (10/24/2018)   Social Connection and Isolation Panel [NHANES]    Frequency of Communication with Friends and Family: Patient declined    Frequency of Social Gatherings with Friends and Family: Patient declined    Attends Religious Services: Patient declined    Marine scientist or Organizations: Patient declined    Attends Archivist Meetings: Patient declined    Marital Status: Patient declined  Intimate Partner Violence: Unknown (10/24/2018)   Humiliation, Afraid, Rape, and Kick questionnaire    Fear of Current or Ex-Partner: Patient declined    Emotionally Abused: Patient declined    Physically Abused: Patient declined    Sexually Abused: Patient declined    Past Medical History, Surgical history, Social history, and Family history were reviewed and updated as appropriate.   Please see review of systems for further details on the patient's review from today.   Objective:   Physical Exam:  There were no vitals taken for this visit.  Physical Exam Neurological:     Mental Status: She is alert and oriented to person, place, and time.     Cranial Nerves: No dysarthria.  Psychiatric:        Attention and Perception: She is inattentive. She does not perceive auditory hallucinations.        Mood and Affect: Mood is anxious and depressed.        Speech: Speech normal. Speech is not slurred.        Behavior: Behavior is not slowed or aggressive. Behavior is cooperative.        Thought Content: Thought content normal. Thought content is not paranoid or delusional. Thought content does not include homicidal or suicidal ideation. Thought  content does not include suicidal plan.        Cognition and Memory: Memory is impaired. She exhibits impaired recent memory.        Judgment: Judgment is not impulsive or inappropriate.     Comments:   Still depressed, anxious and less irritable.  Insight and judgment are  fair Talkative Gets meds mixed up at times and changes with unclear reasons at times     Lab Review:     Component Value Date/Time   NA 135 11/27/2022 1109   NA 135 01/02/2020 1602   K 3.8 11/27/2022 1109   CL 103 11/27/2022 1109   CO2 23 11/27/2022 1109   GLUCOSE 111 (H) 11/27/2022 1109   BUN 25 (H) 11/27/2022 1109   BUN 17 01/02/2020 1602   CREATININE 1.26 (H) 11/27/2022 1109   CREATININE 0.77 07/28/2015 1805   CALCIUM 9.1 11/27/2022 1109   PROT 7.2 11/27/2022 1109   PROT 7.9 01/02/2020 1602   ALBUMIN 3.5 11/27/2022 1109   ALBUMIN 4.2 01/02/2020 1602   AST 16 11/27/2022 1109   ALT 12 11/27/2022 1109   ALKPHOS 67 11/27/2022 1109   BILITOT 0.4 11/27/2022 1109   BILITOT 0.2 01/02/2020 1602   GFRNONAA 43 (L) 11/27/2022 1109   GFRAA 58 (L) 01/02/2020 1602       Component Value Date/Time   WBC 6.3 11/27/2022 1109   RBC 4.67 11/27/2022 1109   HGB 13.6 11/27/2022 1109   HGB 11.9 01/02/2020 1602   HCT 43.1 11/27/2022 1109   HCT 36.4 01/02/2020 1602   PLT 212 11/27/2022 1109   PLT 370 01/02/2020 1602   MCV 92.3 11/27/2022 1109   MCV 90 01/02/2020 1602   MCH 29.1 11/27/2022 1109   MCHC 31.6 11/27/2022 1109   RDW 13.7 11/27/2022 1109   RDW 14.0 01/02/2020 1602   LYMPHSABS 1.2 11/27/2022 1109   LYMPHSABS 2.7 01/02/2020 1602   MONOABS 0.5 11/27/2022 1109   EOSABS 0.1 11/27/2022 1109   EOSABS 0.1 01/02/2020 1602   BASOSABS 0.0 11/27/2022 1109   BASOSABS 0.0 01/02/2020 1602    No results found for: "POCLITH", "LITHIUM"   No results found for: "PHENYTOIN", "PHENOBARB", "VALPROATE", "CBMZ"   .res Assessment: Plan:    Major depressive disorder, recurrent episode, moderate (HCC) - Plan: Asenapine Maleate (SAPHRIS) 2.5 MG SUBL  Generalized anxiety disorder - Plan: Asenapine Maleate (SAPHRIS) 2.5 MG SUBL  Delayed sleep phase syndrome  History of CVA (cerebrovascular accident)  Restless legs syndrome  Noncompliance with medication regimen  Mild cognitive  impairment -- w/ potential for multiple delirious conditions  Likely episodes of panic.    Albena has some chronic depression and chronic irregular sleep patterns which have been resistant to treatment.  Her chronic depression and anxiety are complicated now by double grief of loss of her sister and her husband.  She is lonely and mood affected by recent severe health problems.   Chronic depression and noncompiance with meds is major contribution to her depression bc meds won't work without consistency. Chronic issues with compliance in part DT chronic nausea. Therefore will not attempt changes today.  Disc compliance again. This might be triggering panic episodes though she denies episodes of panic.  Chronic N and dizziness are ongoing problems. And interfere with compliance.  Stopped lamotrigine.    Cont counseling for grief, depression, anxiety, and health crises.  Supportive therapy dealing with health problems. Try to watch more positive things and not just news.  Dizziness is likely DT prior  strokes and not meds.  She reduced duloxetine to 30 mg daily . Cannot think of a sublingual antidepressant and selegiline would not be safe for her.    Had to increase ropinirole 3 mg for RLS.  This has better controlled her restless legs.   Stop the coffee after dinner.   Disc SE incl hallucinations and dizziness.   This high dosage appears medically necessary.  Call if this fails and will consider doing something with the clonazepam either increasing it or perhaps switching to another benzodiazepine which might have less cognitive problems.  Such as lorazepam Continue clonazepam at LED at 0.25-0.5 mg BID prn anxiety. We discussed the short-term risks associated with benzodiazepines including sedation and increased fall risk among others.  Discussed long-term side effect risk including dependence, potential withdrawal symptoms, and the potential eventual dose-related risk of dementia.  But recent  studies from 2020 dispute this association between benzodiazepines and dementia risk. Newer studies in 2020 do not support an association with dementia.  Emphasized importance and proper nutrition and sleep and protein.  Doesn't eat much meat bc constipation but will eat fish.  Discussed how protein is necessary for the proper response to depression medicines.  Can't cook easily.    Sleep hygiene and disc her delayed sleep phase which is chronic at this time.    Trial Saphris SL 2.5 mg HS for mood, sleep and anxiety. Pt needs SL if available bc N is severe when takes meds.  She has some difficulty understanding the med changes and keeping up with them.  Needs some frequency of visits for supportive therapy and self care direction DT ongoing medical problems  FU 2-3 mos.  Lynder Parents, MD, DFAPA  Please see After Visit Summary for patient specific instructions.  Future Appointments  Date Time Provider [place]  12/13/2022  3:00 PM [name], MD [facility] [place]  01/10/2023 11:00 AM Blanchie Serve, PhD CP-CP None      No orders of the defined types were placed in this encounter.      -------------------------------

## 2022-12-05 NOTE — Telephone Encounter (Signed)
        Patient  visited [facility] 3/18  Telephone encounter attempt :  2nd   A HIPAA compliant voice message was left requesting a return call.  Instructed patient to call back .    [facility] [phone] [address], [place], [place] [address] Phone: [phone] Email: [email or web address]

## 2022-12-08 ENCOUNTER — Encounter: Payer: Self-pay | Admitting: General Practice

## 2022-12-13 ENCOUNTER — Ambulatory Visit: Payer: Medicare Other | Admitting: Family Medicine

## 2022-12-14 ENCOUNTER — Ambulatory Visit: Payer: Medicare Other | Admitting: Family Medicine

## 2022-12-20 ENCOUNTER — Ambulatory Visit (INDEPENDENT_AMBULATORY_CARE_PROVIDER_SITE_OTHER): Payer: Medicare Other | Admitting: Psychiatry

## 2022-12-20 DIAGNOSIS — J84112 Idiopathic pulmonary fibrosis: Secondary | ICD-10-CM | POA: Diagnosis not present

## 2022-12-20 DIAGNOSIS — Z634 Disappearance and death of family member: Secondary | ICD-10-CM

## 2022-12-20 DIAGNOSIS — F17219 Nicotine dependence, cigarettes, with unspecified nicotine-induced disorders: Secondary | ICD-10-CM | POA: Diagnosis not present

## 2022-12-20 DIAGNOSIS — I739 Peripheral vascular disease, unspecified: Secondary | ICD-10-CM

## 2022-12-20 DIAGNOSIS — Z62820 Parent-biological child conflict: Secondary | ICD-10-CM

## 2022-12-20 DIAGNOSIS — G4721 Circadian rhythm sleep disorder, delayed sleep phase type: Secondary | ICD-10-CM | POA: Diagnosis not present

## 2022-12-20 DIAGNOSIS — Z9189 Other specified personal risk factors, not elsewhere classified: Secondary | ICD-10-CM | POA: Diagnosis not present

## 2022-12-20 DIAGNOSIS — Z8673 Personal history of transient ischemic attack (TIA), and cerebral infarction without residual deficits: Secondary | ICD-10-CM | POA: Diagnosis not present

## 2022-12-20 DIAGNOSIS — G3184 Mild cognitive impairment, so stated: Secondary | ICD-10-CM

## 2022-12-20 DIAGNOSIS — F411 Generalized anxiety disorder: Secondary | ICD-10-CM

## 2022-12-20 DIAGNOSIS — Z91148 Patient's other noncompliance with medication regimen for other reason: Secondary | ICD-10-CM

## 2022-12-20 DIAGNOSIS — F331 Major depressive disorder, recurrent, moderate: Secondary | ICD-10-CM | POA: Diagnosis not present
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
## 2022-12-21 ENCOUNTER — Telehealth: Payer: Self-pay | Admitting: Psychiatry

## 2022-12-21 ENCOUNTER — Ambulatory Visit (INDEPENDENT_AMBULATORY_CARE_PROVIDER_SITE_OTHER): Payer: Medicare Other | Admitting: Family Medicine

## 2022-12-21 VITALS — BP 120/84 | HR 76 | Temp 98.2°F | Resp 18 | Ht 66.0 in | Wt 160.0 lb

## 2022-12-21 DIAGNOSIS — R42 Dizziness and giddiness: Secondary | ICD-10-CM | POA: Diagnosis not present

## 2022-12-21 DIAGNOSIS — L989 Disorder of the skin and subcutaneous tissue, unspecified: Secondary | ICD-10-CM

## 2022-12-21 DIAGNOSIS — Z87898 Personal history of other specified conditions: Secondary | ICD-10-CM | POA: Diagnosis not present

## 2022-12-21 DIAGNOSIS — I6381 Other cerebral infarction due to occlusion or stenosis of small artery: Secondary | ICD-10-CM | POA: Diagnosis not present

## 2022-12-21 DIAGNOSIS — D229 Melanocytic nevi, unspecified: Secondary | ICD-10-CM | POA: Diagnosis not present

## 2022-12-21 DIAGNOSIS — D329 Benign neoplasm of meninges, unspecified: Secondary | ICD-10-CM

## 2022-12-21 NOTE — Telephone Encounter (Signed)
Pt LVM @ 4:00p.  She said Dr Jennelle Human wanted her to start a new medicine. She remembered he told her it goes under her tongue.  He told her her it might be expensive, but when she went to [facility] they said it was over the counter.  She's just wondering if it was the right medicine.  Next appt 5/23

## 2022-12-21 NOTE — Telephone Encounter (Signed)
I called patient. Medication is Saphris, that had been sent to the pharmacy 3/26.  Patient didn't remember what the medication was. She went to the pharmacy and said they told her it was an OTC medication.  She asked that I send her a text with the name. I told her I could send her a MyChart note, which I did.

## 2022-12-21 NOTE — Progress Notes (Signed)
Subjective:  Patient ID: [name], female    DOB: [birth year]  Age: 77 y.o. MRN: [id number]  CC:  Chief Complaint  Patient presents with   Hospitalization Follow-up    Pt states that she she is having panic attacks  PHQ-9 22 GAD-7 19    Dizziness    Pt complains of dizziness and nausea     HPI [name] presents for  ER follow-up.  She was seen in January and then again March 18, for near syncope.  ER notes reviewed from March 18.  She was smoking outside, got up and did not feel well, sweaty, called EMS, told vitals were normal.  Felt better when in the ER. Blood pressure was 173/91, O2 sat 100%.  Afebrile.  Glucose 111, creatinine 1.26, EKG reported no significant change since prior tracing.  Chest x-ray with chronic lung changes but no acute cardiopulmonary disease.  IV fluids were given with improved symptoms.  Recommended increase fluid intake and refer to cardiology to follow-up for other possible etiologies.  Also noted esophageal inflammation on CT in January, plan to follow-up with gastroenterology which she had not done, recommended to follow-up with Dr. Adela Lank.  Last visit with me in October 2023, recommended 1 month follow-up to review chronic medical problems at that time, have not seen her since that visit.  Has been drinking more water, still drinking pop and coffee.  Still feeling dizzy - feels this way a lot. Using cane, no recent falls.  Feeling dizzy/lightheaded - constant, no room spinning. Feels like can lose balance easily. No syncope since ER visit. Feels like loss of strength in both legs - for past few  years. Referred to home health PT last year. Refusal of services noted in December.  On and off left sided headaches past 6 months.  CT head 10/05/2022 with 11 mm meningioma over the posterior left frontal lobe unchanged in size from prior head CT in 2019.  The mass did contact the underlying brain parenchyma without appreciable edema.  The lacunar  infarct in the right thalamus was new from prior head CT of 2019 but otherwise age-indeterminate.  Chronic lacunar infarcts within the bilateral basal ganglia, advanced chronic small vessel ischemic changes Within the cerebral white matter and cerebral atrophy No recent neuro eval pr cardiology eval.  Feels heart palpitations at times - unable to determine if at time of dizziness.  Chest tightens and palpitations at time of panic attacks. Chronic dyspnea with pulmonary fibrosis.   Drinking Pepsi in office.  Additionally complains of anxiety, panic attacks.  She is followed by psychiatry, Dr. Jennelle Human, therapist Marliss Czar.  Psychiatry appointment March 26.  Medications adjusted with plan for follow-up in 2 to 3 months.  Saphris sublingual 2.5 mg if needed for mood, sleep, anxiety, diazepam twice daily as needed, stop coffee, ropinirole increased to 3 mg for RLS.  on duloxetine 30 mg daily. Met with therapist yesterday. Discussed anxiety with therapist yesterday. Has not taken new med saphris.   Few lesions on back she is concerned about. Not sure how long those have been there or if changes.    Lab Results  Component Value Date   NA 135 11/27/2022   K 3.8 11/27/2022   CL 103 11/27/2022   CO2 23 11/27/2022   Lab Results  Component Value Date   CREATININE 1.26 (H) 11/27/2022   Lab Results  Component Value Date   WBC 6.3 11/27/2022   HGB 13.6 11/27/2022  HCT 43.1 11/27/2022   MCV 92.3 11/27/2022   PLT 212 11/27/2022     History Patient Active Problem List   Diagnosis Date Noted   Centrilobular emphysema 10/19/2020   Chronic respiratory failure with hypoxia 12/26/2019   Shortness of breath 12/26/2019   Bilateral pulmonary infiltrates on CXR    Complicated UTI (urinary tract infection) 11/11/2019   AKI (acute kidney injury) 11/11/2019   Hyponatremia 11/11/2019   Elevated LFTs 11/11/2019   Sepsis 11/11/2019   Wound infection 11/07/2018   Postoperative pain    Sleep  disturbance    Tobacco abuse    Acute blood loss anemia    Hypoalbuminemia due to protein-calorie malnutrition    Debility 10/24/2018   Pre-operative cardiovascular examination 09/26/2018   HLD (hyperlipidemia) 09/26/2018   GAD (generalized anxiety disorder) 08/07/2018   Major depressive disorder, single episode 08/07/2018   Appendiceal abscess 07/08/2018   PVD (peripheral vascular disease) 03/15/2018   Atherosclerosis of native artery of left lower extremity with intermittent claudication 03/15/2018   Chronic left-sided low back pain with left-sided sciatica 12/25/2017   Facial droop    History of CVA (cerebrovascular accident) 10/23/2017   Critical lower limb ischemia 11/08/2016   Smoker 11/08/2016   Postinflammatory pulmonary fibrosis 02/14/2016   Cigarette smoker 12/24/2015   Hypothyroid ? 01/16/2014   Mild cognitive impairment 01/16/2014   Meningioma 10/21/2013   Chest pain 10/20/2013   Medicare annual wellness visit, subsequent 06/16/2013   Dizziness and giddiness 06/16/2013   RLS (restless legs syndrome) 04/29/2012   Abdominal pain 12/27/2010   DEGENERATIVE JOINT DISEASE 09/23/2010   CAROTID ARTERY DISEASE 08/15/2010   CHEST PAIN 08/15/2010   HEADACHE 12/02/2009   BACK PAIN 11/22/2009   CONSTIPATION, CHRONIC 01/29/2008   NAUSEA 01/28/2008   DEPRESSION 03/08/2007   HTN (hypertension) 03/08/2007   Past Medical History:  Diagnosis Date   AAA (abdominal aortic aneurysm) (HCC)    3.1 cm 07/08/18, 3 year follow-up recommended; possible 3 cm AAA by aortogram 09/13/18   Anemia    PMH   Appendicitis with abscess    07/08/18, s/p perc drain; resolved 07/30/18 by CT   Arthritis    Bipolar disorder (HCC)    Cerebrovascular disease    intra and extracranial vascular dx per MRI 4/11, neurology rec strict CVRF control   Colonic inertia    Constipation    chronic;severe   Coronary artery disease    Depression    Duodenitis    EKG abnormalities    changes, stress test neg  (false EKG changes)   Gastritis    GERD (gastroesophageal reflux disease)    Hypertension    Hypothyroid 01/16/2014   Meningioma (HCC) 10/21/2013   PAD (peripheral artery disease) (HCC)    Psoriasis    sees derm   Stroke [facility])    Wears dentures    Wears glasses    Past Surgical History:  Procedure Laterality Date   ABDOMINAL AORTOGRAM W/LOWER EXTREMITY N/A 09/13/2018   Procedure: ABDOMINAL AORTOGRAM W/LOWER EXTREMITY;  Surgeon: Sherren Kerns, MD;  Location: [facility] INVASIVE [facility];  Service: Cardiovascular;  Laterality: N/A;   arthroscopy  04/2010   Right knee   CATARACT EXTRACTION W/ INTRAOCULAR LENS  IMPLANT, BILATERAL     COLONOSCOPY     FEMORAL-POPLITEAL BYPASS GRAFT  10/22/2018   FEMORAL-POPLITEAL BYPASS GRAFT Left 10/22/2018   Procedure: LEFT FEMORAL TO BELOW THE KNEE POPLITEAL ARTERY BYPASS GRAFT;  Surgeon: Sherren Kerns, MD;  Location: [facility] OR;  Service: Vascular;  Laterality: Left;   I & D EXTREMITY Left 11/08/2018   Procedure: IRRIGATION AND DEBRIDEMENT EXTREMITY Left Leg;  Surgeon: Sherren Kerns, MD;  Location: [facility] OR;  Service: Vascular;  Laterality: Left;   IR RADIOLOGIST EVAL & MGMT  07/30/2018   MULTIPLE TOOTH EXTRACTIONS     TUBAL LIGATION     No Known Allergies Prior to Admission medications   Medication Sig Start Date End Date Taking? Authorizing Provider  albuterol (VENTOLIN HFA) 108 (90 Base) MCG/ACT inhaler Inhale 1-2 puffs into the lungs every 6 (six) hours as needed for wheezing or shortness of breath. 06/09/22  Yes Shade Flood, MD  amLODipine (NORVASC) 10 MG tablet 1 tablet Orally Once a day for 30 day(s)   Yes [provider]  Asenapine Maleate (SAPHRIS) 2.5 MG SUBL Place 1 tablet (2.5 mg total) under the tongue at bedtime. 12/05/22  Yes Cottle, Steva Ready., MD  clonazePAM (KLONOPIN) 1 MG tablet PRN   Yes [provider]  DULoxetine (CYMBALTA) 30 MG capsule Take 1 capsule (30 mg total) by mouth 2 (two) times daily. Patient taking  differently: Take 30 mg by mouth daily. 11/03/21  Yes Cottle, Steva Ready., MD  famotidine (PEPCID) 20 MG tablet One at bedtime 02/14/16  Yes Nyoka Cowden, MD  famotidine (PEPCID) 20 MG tablet 1 tablet at bedtime as needed Orally Once a day for 30 day(s)   Yes [provider]  lamoTRIgine (LAMICTAL) 25 MG tablet Take 3 tablets (75 mg total) by mouth daily. 11/03/21  Yes Cottle, Steva Ready., MD  linaclotide [facility]) 145 MCG CAPS capsule Take 1 capsule (145 mcg total) by mouth 2 (two) times daily. 07/01/21  Yes Armbruster, Willaim Rayas, MD  Magnesium Hydroxide (DULCOLAX PO) Take by mouth as needed.   Yes [provider]  Melatonin 10 MG TABS Take 20 mg by mouth at bedtime.   Yes [provider]  omeprazole (PRILOSEC) 20 MG capsule Take 1 capsule (20 mg total) by mouth daily. 10/05/22  Yes Plunkett, Alphonzo Lemmings, MD  rOPINIRole (REQUIP) 1 MG tablet 1 tablet 1 to 3 hours before bedtime Orally Once a day for 30 day(s)   Yes [provider]  rOPINIRole (REQUIP) 2 MG tablet TAKE 1 TABLET(2 MG) BY MOUTH AT BEDTIME 10/17/22  Yes Cottle, Steva Ready., MD  Senna EXTR Take by mouth as needed.   Yes [provider]  triamcinolone cream (KENALOG) 0.1 % Apply 1 Application topically 2 (two) times daily. 06/22/22  Yes Shade Flood, MD  XARELTO 20 MG TABS tablet TAKE 1 TABLET(20 MG) BY MOUTH DAILY WITH SUPPER 10/31/21  Yes Shade Flood, MD   Social History   Socioeconomic History   Marital status: Widowed    Spouse name: Not on file   Number of children: 1   Years of education: Not on file   Highest education level: Not on file  Occupational History   Occupation: retired  Tobacco Use   Smoking status: Every Day    Packs/day: 0.50    Years: 60.00    Additional pack years: 0.00    Total pack years: 30.00    Types: Cigarettes    Last attempt to quit: 11/10/2019    Years since quitting: 3.1   Smokeless tobacco: Never  Vaping Use   Vaping Use: Former  Substance and  Sexual Activity   Alcohol use: Yes    Alcohol/week: 0.0 standard drinks of alcohol    Comment: yes on  occassion   Drug use: No   Sexual activity: Not Currently  Other Topics Concern   Not on file  Social History Narrative   Brother in law Mr Cranston Neighbor (one of my patients)   Lives w/ husband       Social Determinants of Health   Financial Resource Strain: Low Risk  (03/23/2021)   Overall Financial Resource Strain (CARDIA)    Difficulty of Paying Living Expenses: Not hard at all  Food Insecurity: No Food Insecurity (03/23/2021)   Hunger Vital Sign    Worried About Running Out of Food in the Last Year: Never true    Ran Out of Food in the Last Year: Never true  Transportation Needs: No Transportation Needs (03/23/2021)   PRAPARE - Administrator, Civil Service (Medical): No    Lack of Transportation (Non-Medical): No  Physical Activity: Inactive (03/23/2021)   Exercise Vital Sign    Days of Exercise per Week: 0 days    Minutes of Exercise per Session: 0 min  Stress: Not on file  Social Connections: Unknown (10/24/2018)   Social Connection and Isolation Panel [NHANES]    Frequency of Communication with Friends and Family: Patient declined    Frequency of Social Gatherings with Friends and Family: Patient declined    Attends Religious Services: Patient declined    Database administrator or Organizations: Patient declined    Attends Banker Meetings: Patient declined    Marital Status: Patient declined  Intimate Partner Violence: Unknown (10/24/2018)   Humiliation, Afraid, Rape, and Kick questionnaire    Fear of Current or Ex-Partner: Patient declined    Emotionally Abused: Patient declined    Physically Abused: Patient declined    Sexually Abused: Patient declined    Review of Systems Per hpi.   Objective:   Vitals:   12/21/22 1538  BP: 120/84  Pulse: 76  Resp: 18  Temp: 98.2 F (36.8 C)  TempSrc: Temporal  SpO2: 96%  Weight: 160 lb (72.6  kg)  Height: 5\' 6"  (1.676 m)     Physical Exam Vitals reviewed.  Constitutional:      Appearance: Normal appearance. She is well-developed.  HENT:     Head: Normocephalic and atraumatic.  Eyes:     Conjunctiva/sclera: Conjunctivae normal.     Pupils: Pupils are equal, round, and reactive to light.  Neck:     Vascular: No carotid bruit.  Cardiovascular:     Rate and Rhythm: Normal rate and regular rhythm.     Heart sounds: Normal heart sounds.  Pulmonary:     Effort: Pulmonary effort is normal.     Breath sounds: Normal breath sounds.     Comments: Lower lungs with some coarse breath sounds. Normal effort, no distress.    Abdominal:     Palpations: Abdomen is soft. There is no pulsatile mass.     Tenderness: There is no abdominal tenderness.  Musculoskeletal:     Right lower leg: No edema.     Left lower leg: No edema.  Skin:    General: Skin is warm and dry.     Comments: 3 rounded slightly hyperpigmented, excoriated lesions each approximately 1 cm.  No erythema or active discharge/bleeding.  Neurological:     Mental Status: She is alert and oriented to person, place, and time.     GCS: GCS eye subscore is 4. GCS verbal subscore is 5. GCS motor subscore is 6.     Cranial Nerves: No  cranial nerve deficit or facial asymmetry.     Motor: No weakness or pronator drift.     Comments: Equal upper extremity, lower extremity strength and facial movements, no droop.  No focal weakness.  No pronator drift.  Psychiatric:        Mood and Affect: Mood normal.        Behavior: Behavior normal.      55 minutes spent during visit, including chart review, ER note review,  counseling and assimilation of information, exam, discussion of plan for dizziness and back lesions, and chart completion.    Assessment & Plan:  ALMA BEITH is a 77 y.o. female . Dizziness  History of syncope  Skin lesion of back  Multiple lacunar infarcts  Meningioma  Multiple atypical  nevi  Possible multiple causes for her dizziness.  Denies syncopal events since ER visit.  Could be component of her medications versus volume depletion versus history of multiple infarcts and meningioma as above.  Recent CBC without anemia, less likely cardiac without new chest symptoms and evaluation noted in the ER.  Could have anxiety component, followed by psychiatry and psychology with recent visit and adjustment in medications.  - Did recommend that she decrease soda/coffee intake, increase water intake for treatable cause at home.  -Continue to follow-up with her psychologist and psychiatrist on regimen to treat anxiety component.  -Refer to neurology given history of infarcts and meningioma along with persistent dizziness to see if other workup recommended.  -Although reassuring ER evaluation, and less likely cardiac will refer her to cardiology to discuss dizziness as well given her medical history.  -ER/RTC precautions were discussed.  Few abnormal nevi/lesions on back, possible irritated seborrheic keratosis, no signs of infection at present, refer to dermatology for eval.  1 month follow-up   No orders of the defined types were placed in this encounter.  Patient Instructions  Please discuss anxiety and medications to treat anxiety with your psychiatrist and therapist.  For the persistent dizziness and based on previous strokes, I am referring you to neurology.  That referral will be placed after your visit today.  I am also referring you to cardiology.  Try to drink more water than the caffeinated beverages including sodas or coffee as that can worsen dizziness.  See other information below.  If any acute worsening or new symptoms be seen in the emergency room as we discussed.  Please follow-up with me in 1 month to recheck 3 symptoms and your other chronic medical conditions.  Additionally at your request I will refer you to dermatology to evaluate the areas on the back.  Hang in  there.   Dizziness Dizziness is a common problem. It is a feeling of unsteadiness or light-headedness. You may feel like you are about to faint. Dizziness can lead to injury if you stumble or fall. Anyone can become dizzy, but dizziness is more common in older adults. This condition can be caused by a number of things, including medicines, dehydration, or illness. Follow these instructions at home: Eating and drinking  Drink enough fluid to keep your urine pale yellow. This helps to keep you from becoming dehydrated. Try to drink more clear fluids, such as water. Do not drink alcohol. Limit your caffeine intake if told to do so by your health care provider. Check ingredients and nutrition facts to see if a food or beverage contains caffeine. Limit your salt (sodium) intake if told to do so by your health care provider. Check ingredients  and nutrition facts to see if a food or beverage contains sodium. Activity  Avoid making quick movements. Rise slowly from chairs and steady yourself until you feel okay. In the morning, first sit up on the side of the bed. When you feel okay, stand slowly while you hold onto something until you know that your balance is good. If you need to stand in one place for a long time, move your legs often. Tighten and relax the muscles in your legs while you are standing. Do not drive or use machinery if you feel dizzy. Avoid bending down if you feel dizzy. Place items in your home so that they are easy for you to reach without leaning over. Lifestyle Do not use any products that contain nicotine or tobacco. These products include cigarettes, chewing tobacco, and vaping devices, such as e-cigarettes. If you need help quitting, ask your health care provider. Try to reduce your stress level by using methods such as yoga or meditation. Talk with your health care provider if you need help to manage your stress. General instructions Watch your dizziness for any  changes. Take over-the-counter and prescription medicines only as told by your health care provider. Talk with your health care provider if you think that your dizziness is caused by a medicine that you are taking. Tell a friend or a family member that you are feeling dizzy. If he or she notices any changes in your behavior, have this person call your health care provider. Keep all follow-up visits. This is important. Contact a health care provider if: Your dizziness does not go away or you have new symptoms. Your dizziness or light-headedness gets worse. You feel nauseous. You have reduced hearing. You have a fever. You have neck pain or a stiff neck. Your dizziness leads to an injury or a fall. Get help right away if: You vomit or have diarrhea and are unable to eat or drink anything. You have problems talking, walking, swallowing, or using your arms, hands, or legs. You feel generally weak. You have any bleeding. You are not thinking clearly or you have trouble forming sentences. It may take a friend or family member to notice this. You have chest pain, abdominal pain, shortness of breath, or sweating. Your vision changes or you develop a severe headache. These symptoms may represent a serious problem that is an emergency. Do not wait to see if the symptoms will go away. Get medical help right away. Call your local emergency services (911 in the [place] Do not drive yourself to the hospital. Summary Dizziness is a feeling of unsteadiness or light-headedness. This condition can be caused by a number of things, including medicines, dehydration, or illness. Anyone can become dizzy, but dizziness is more common in older adults. Drink enough fluid to keep your urine pale yellow. Do not drink alcohol. Avoid making quick movements if you feel dizzy. Monitor your dizziness for any changes. This information is not intended to replace advice given to you by your health care provider. Make sure you  discuss any questions you have with your health care provider. Document Revised: 08/02/2020 Document Reviewed: 08/02/2020 Elsevier Patient Education  2023 [employer].           Signed,   Meredith StaggersJeffrey Shakiah Wester, MD [facility], [facility] 12/24/22 8:44 AM

## 2022-12-21 NOTE — Patient Instructions (Addendum)
Please discuss anxiety and medications to treat anxiety with your psychiatrist and therapist.  For the persistent dizziness and based on previous strokes, I am referring you to neurology.  That referral will be placed after your visit today.  I am also referring you to cardiology.  Try to drink more water than the caffeinated beverages including sodas or coffee as that can worsen dizziness.  See other information below.  If any acute worsening or new symptoms be seen in the emergency room as we discussed.  Please follow-up with me in 1 month to recheck 3 symptoms and your other chronic medical conditions.  Additionally at your request I will refer you to dermatology to evaluate the areas on the back.  Hang in there.   Dizziness Dizziness is a common problem. It is a feeling of unsteadiness or light-headedness. You may feel like you are about to faint. Dizziness can lead to injury if you stumble or fall. Anyone can become dizzy, but dizziness is more common in older adults. This condition can be caused by a number of things, including medicines, dehydration, or illness. Follow these instructions at home: Eating and drinking  Drink enough fluid to keep your urine pale yellow. This helps to keep you from becoming dehydrated. Try to drink more clear fluids, such as water. Do not drink alcohol. Limit your caffeine intake if told to do so by your health care provider. Check ingredients and nutrition facts to see if a food or beverage contains caffeine. Limit your salt (sodium) intake if told to do so by your health care provider. Check ingredients and nutrition facts to see if a food or beverage contains sodium. Activity  Avoid making quick movements. Rise slowly from chairs and steady yourself until you feel okay. In the morning, first sit up on the side of the bed. When you feel okay, stand slowly while you hold onto something until you know that your balance is good. If you need to stand in one  place for a long time, move your legs often. Tighten and relax the muscles in your legs while you are standing. Do not drive or use machinery if you feel dizzy. Avoid bending down if you feel dizzy. Place items in your home so that they are easy for you to reach without leaning over. Lifestyle Do not use any products that contain nicotine or tobacco. These products include cigarettes, chewing tobacco, and vaping devices, such as e-cigarettes. If you need help quitting, ask your health care provider. Try to reduce your stress level by using methods such as yoga or meditation. Talk with your health care provider if you need help to manage your stress. General instructions Watch your dizziness for any changes. Take over-the-counter and prescription medicines only as told by your health care provider. Talk with your health care provider if you think that your dizziness is caused by a medicine that you are taking. Tell a friend or a family member that you are feeling dizzy. If he or she notices any changes in your behavior, have this person call your health care provider. Keep all follow-up visits. This is important. Contact a health care provider if: Your dizziness does not go away or you have new symptoms. Your dizziness or light-headedness gets worse. You feel nauseous. You have reduced hearing. You have a fever. You have neck pain or a stiff neck. Your dizziness leads to an injury or a fall. Get help right away if: You vomit or have diarrhea and are  unable to eat or drink anything. You have problems talking, walking, swallowing, or using your arms, hands, or legs. You feel generally weak. You have any bleeding. You are not thinking clearly or you have trouble forming sentences. It may take a friend or family member to notice this. You have chest pain, abdominal pain, shortness of breath, or sweating. Your vision changes or you develop a severe headache. These symptoms may represent a  serious problem that is an emergency. Do not wait to see if the symptoms will go away. Get medical help right away. Call your local emergency services (911 in the [place] Do not drive yourself to the hospital. Summary Dizziness is a feeling of unsteadiness or light-headedness. This condition can be caused by a number of things, including medicines, dehydration, or illness. Anyone can become dizzy, but dizziness is more common in older adults. Drink enough fluid to keep your urine pale yellow. Do not drink alcohol. Avoid making quick movements if you feel dizzy. Monitor your dizziness for any changes. This information is not intended to replace advice given to you by your health care provider. Make sure you discuss any questions you have with your health care provider. Document Revised: 08/02/2020 Document Reviewed: 08/02/2020 Elsevier Patient Education  2023 [employer].

## 2022-12-24 ENCOUNTER — Encounter: Payer: Self-pay | Admitting: Family Medicine

## 2022-12-28 ENCOUNTER — Telehealth: Payer: Self-pay | Admitting: Family Medicine

## 2022-12-28 NOTE — Telephone Encounter (Signed)
Called patient to schedule Medicare Annual Wellness Visit (AWV). Left message for patient to call back and schedule Medicare Annual Wellness Visit (AWV).  Last date of AWV: 06/16/2013  Please schedule AWVS appointment at any time with [facility] ANNUAL WELLNESS VISIT.  If any questions, please contact me at [phone].    Thank you,  [facility] Support [facility] Direct dial  [phone]

## 2023-01-10 ENCOUNTER — Ambulatory Visit (INDEPENDENT_AMBULATORY_CARE_PROVIDER_SITE_OTHER): Payer: Medicare Other | Admitting: Psychiatry

## 2023-01-10 ENCOUNTER — Telehealth: Payer: Self-pay | Admitting: Family Medicine

## 2023-01-10 DIAGNOSIS — Z91148 Patient's other noncompliance with medication regimen for other reason: Secondary | ICD-10-CM | POA: Diagnosis not present

## 2023-01-10 DIAGNOSIS — Z62820 Parent-biological child conflict: Secondary | ICD-10-CM | POA: Diagnosis not present

## 2023-01-10 DIAGNOSIS — I739 Peripheral vascular disease, unspecified: Secondary | ICD-10-CM | POA: Diagnosis not present

## 2023-01-10 DIAGNOSIS — Z9189 Other specified personal risk factors, not elsewhere classified: Secondary | ICD-10-CM | POA: Diagnosis not present

## 2023-01-10 DIAGNOSIS — F17219 Nicotine dependence, cigarettes, with unspecified nicotine-induced disorders: Secondary | ICD-10-CM | POA: Diagnosis not present

## 2023-01-10 DIAGNOSIS — J84112 Idiopathic pulmonary fibrosis: Secondary | ICD-10-CM

## 2023-01-10 DIAGNOSIS — J9611 Chronic respiratory failure with hypoxia: Secondary | ICD-10-CM

## 2023-01-10 DIAGNOSIS — Z8673 Personal history of transient ischemic attack (TIA), and cerebral infarction without residual deficits: Secondary | ICD-10-CM | POA: Diagnosis not present

## 2023-01-10 DIAGNOSIS — F411 Generalized anxiety disorder: Secondary | ICD-10-CM

## 2023-01-10 DIAGNOSIS — F331 Major depressive disorder, recurrent, moderate: Secondary | ICD-10-CM

## 2023-01-10 DIAGNOSIS — G4721 Circadian rhythm sleep disorder, delayed sleep phase type: Secondary | ICD-10-CM | POA: Diagnosis not present

## 2023-01-10 DIAGNOSIS — G3184 Mild cognitive impairment, so stated: Secondary | ICD-10-CM | POA: Diagnosis not present

## 2023-01-10 NOTE — Telephone Encounter (Signed)
Called patient to schedule Medicare Annual Wellness Visit (AWV). Left message for patient to call back and schedule Medicare Annual Wellness Visit (AWV).  Last date of AWV: 06/11/2014  Please schedule an AWVS appointment at any time with [facility] ANNUAL WELLNESS VISIT.  If any questions, please contact me at [phone].    Thank you,  [facility] Support [facility] Direct dial  [phone]

## 2023-01-10 NOTE — Progress Notes (Signed)
Psychotherapy Progress Note [facility] Marliss Czar, PhD LP  Patient ID: PERSEUS TAKALA)    MRN: [id number] Therapy format: Individual psychotherapy Date: 01/10/2023      Start: 11:14a     Stop: 12:02p     Time Spent: 48 min Location: Telehealth visit -- I connected with this patient by an approved telecommunication method (audio only), with her informed consent, and verifying identity and patient privacy.  I was located at my office and patient at her home.  As needed, we discussed the limitations, risks, and security and privacy concerns associated with telehealth service, including the availability and conditions which currently govern in-person appointments and the possibility that 3rd-party payment may not be fully guaranteed and she may be responsible for charges.  After she indicated understanding, we proceeded with the session.  Also discussed treatment planning, as needed, including ongoing verbal agreement with the plan, the opportunity to ask and answer all questions, her demonstrated understanding of instructions, and her readiness to call the office should symptoms worsen or she feels she is in a crisis state and needs more immediate and tangible assistance.   Session narrative (presenting needs, interim history, self-report of stressors and symptoms, applications of prior therapy, status changes, and interventions made in session) Slept on sofa last night, doing that a lot lately, not that well rested today, though she has been getting up more like 9 or 10a that way, as opposed to 2, 3, or 4pm like she was.  Cites some irrational fear of being watched from the woods around her, given her ample windows.  In past 3 weeks, [name] visited, and she's "recovering" from a new unleashing of his anger, she says, complete with comments like "drop dead" and "you were a lousy mother".  No new comments to this effect, but historical word that his wife Corrie Dandy is so afraid of Annastazia  that she shakes -- seems like an exaggeration on telling it, but not sure whether it is [name]'s, [name]'s, or Nanna's.    Probed why any of a visit from her son had to come to that, and it's not clear, just sounds like [name] comes in conflicted, apparently trying to work while traveling and still "help", only impatient, easily irritated, feeling intensely obligated, plus his diabetes and his drinking may loosen his self-control, while Luisafernanda is still much too automatic to react to perceived disrespect, pressure, even just feeling weaker in her body and her life.  As of today, she regrets the falling out and wants to repair something.  Advocated she message [name] when ready, share the feedback that to her doctor they sometimes sound "too married" for the relationship they actually have, and offer the idea that they ground themselves with a couple of agreements when seeing each other, namely to stay off hot button issues (like Trump, or her hx of calling [name] an "unfit mother") and make a time-out agreement if either one falters, wherein they will seek a time-out or ask the other one to back off -- and respect it if asked -- rather than mouth off reflexively with each other.  Also recommendation for her to mentally practice the response, "[name], do you have to do that?" Without embellishing, just leave the question hanging, as a way to change the tone, and hopefully call out more mature behavior in her son.  Back to the housing issue, says she met with attorney, establishing a Medicaid trust, and had some discussion of financial planning, including  the possibility of using home equity to stabilize her finances.  Is paying off a home equity loan currently (about $7K) but not an emergency.  Has a rolling walker, some worries about using it downhill on her driveway, but otherwise helpful for getting around.  Has housekeeping come in periodically.  All told, sounds like she is more capable of working out healthy living  at home than before, and allowed that she has options.  Encouraged look into some degree of home companion service, if she can muster herself to trust it, but otherwise still look into what independent living would look like, as well as renting out her home for a stabilizing source of income.    Also had a realization about her horrifying dream, mentioned last time, that there probably is life after death, she just didn't experience it.  Affirmed return of her faith, and willingness to apply herself back to problems in living.  Therapeutic modalities: Cognitive Behavioral Therapy, Solution-Oriented/Positive Psychology, Ego-Supportive, Faith-sensitive, and Assertiveness/Communication  Mental Status/Observations:  Appearance:   Not assessed     Behavior:  Appropriate  Motor:  Not assessed  Speech/Language:   Clear and Coherent  Affect:  Not assessed  Mood:  anxious and depressed  Thought process:  circumstantial  Thought content:    worry  Sensory/Perceptual disturbances:    grossly intact  Orientation:  grossly intact  Attention:  grossly intact    Concentration:  grossly intact  Memory:  grossly intact  Insight:    Good  Judgment:   Fair  Impulse Control:  Fair   Risk Assessment: Danger to Self: No Self-injurious Behavior: No Danger to Others: No Physical Aggression / Violence: No Duty to Warn: No Access to Firearms a concern: No  Assessment of progress:  progressing  Diagnosis:   ICD-10-CM   1. Major depressive disorder, recurrent episode, moderate (HCC)  F33.1     2. Generalized anxiety disorder  F41.1     3. 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 History of CVA (cerebrovascular accident)  Z86.73     5. Noncompliance with medication regimen  Z91.148     6. 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  G31.84     7. Nicotine dependence, cigarettes, with unspecified nicotine-induced disorders  F17.219     8. PVD (peripheral vascular  disease) (HCC)  I73.9     9. Idiopathic pulmonary fibrosis (HCC) (per chart; pt self-describes COPD)  J84.112     10. At risk of disease - UTI, decubitus ulcer, others  Z91.89     11. Relationship problem between parent and child  Z62.820     12. Chronic respiratory failure with hypoxia (HCC)  J96.11      Plan:  Self-care issues -- Prioritize personal health care over all concerns about family relationships, respect or lack thereof, and financial concerns.  Self-affirm that higher care can still be both necessary and affordable, and do not talk self out of engaging treatment and relocation based on imagined financial issues.  Collaborate with medical social worker and other home health if/when PCP set them up.  Begin looking into options and costs for independent living, to include both an elder community or care facility and for-hire personal companion service.  both Investment banker, corporate to assess renting the house and independent living environments to assess costs and know how to she can get her needs met if health deteriorates.  Upon further evidence of failure to manage or  risk of more serious self-neglect, call Adult Protective Services. Health -- Abide by medical advice for chronic, serious conditions, maintain timely primary care, and seek help promptly for breathing issues or signs of cardiovascular or cerebrovascular issues Family relations -- Offer to Stevenson while timely therapeutic advice to make a nonprovocation and timeout agreement and practice nonprovocative response to harshness or verbal abuse.  Available to meet together with son if needed. Cognitive impairment -- Monitor for deterioration, consequential delusions Other recommendations/advice as may be noted above Continue to utilize previously learned skills ad lib Maintain medication as prescribed and work faithfully with relevant prescriber(s) if any changes are desired or seem indicated Call the clinic on-call service,  988/hotline, 911, or present to [facility] or ER if any life-threatening psychiatric crisis Return for time as already scheduled, put on [place] list. Already scheduled visit in this office 02/01/2023.  Robley Fries, PhD Marliss Czar, PhD LP Clinical Psychologist, [facility] [address], [address] [place], [place] [address] [phone]

## 2023-01-23 ENCOUNTER — Telehealth: Payer: Self-pay | Admitting: Family Medicine

## 2023-01-23 NOTE — Telephone Encounter (Signed)
Contacted [name] to schedule their annual wellness visit. Appointment made for 01/24/2023.  Thank you,  [facility] Support [facility] Direct dial  [phone]

## 2023-01-30 NOTE — Progress Notes (Incomplete)
Psychotherapy Progress Note [facility] Jenny Alahni Varone, PhD LP  Patient ID: [name] ([name])    MRN: [id number] Therapy format: Individual psychotherapy Date: 01/10/2023      Start: 11:14a     Stop: 12:02p     Time Spent: 48 min Location: Telehealth visit -- I connected with this patient by an approved telecommunication method (audio only), with her informed consent, and verifying identity and patient privacy.  I was located at my office and patient at her home.  As needed, we discussed the limitations, risks, and security and privacy concerns associated with telehealth service, including the availability and conditions which currently govern in-person appointments and the possibility that 3rd-party payment may not be fully guaranteed and she may be responsible for charges.  After she indicated understanding, we proceeded with the session.  Also discussed treatment planning, as needed, including ongoing verbal agreement with the plan, the opportunity to ask and answer all questions, her demonstrated understanding of instructions, and her readiness to call the office should symptoms worsen or she feels she is in a crisis state and needs more immediate and tangible assistance.   Session narrative (presenting needs, interim history, self-report of stressors and symptoms, applications of prior therapy, status changes, and interventions made in session) Slept on sofa last night, doing that a lot lately, not that well rested today, though she has been getting up more like 9 or 10a that way, as opposed to 2, 3, or 4pm like she was.  Cites some irrational fear of being watched from the woods around her, given her ample windows.  In past 3 weeks, [name] visited, and she's "recovering" from a new unleashing of his anger, she says, complete with comments like "drop dead" and "you were a lousy mother".  No new comments to this effect, but historical word that his wife [name] is so afraid of [name]  that she shakes -- seems like an exaggeration on telling it, but not sure whether it is [name]'s, [name]'s, or [name]'s.    Probed why any of a visit from her son had to come to that, and it's not clear, just sounds like [name] comes in conflicted, apparently trying to work while traveling and still "help", only impatient, easily irritated, feeling intensely obligated, plus his diabetes and his drinking may loosen his self-control, while [name] is still much too automatic to react to perceived disrespect, pressure, even just feeling weaker in her body and her life.  As of today, she regrets the falling out and wants to repair something.  Advocated she message [name] when ready, share the feedback that to her doctor they sometimes sound "too married" for the relationship they actually have, and offer the idea that they ground themselves with a couple of agreements when seeing each other, namely to stay off hot button issues (like Trump, or her hx of calling [name] an "unfit mother") and make a time-out agreement if either one falters, wherein they will seek a time-out or ask the other one to back off -- and respect it if asked -- rather than mouth off reflexively with each other.  Also recommendation for her to mentally practice the response, "[name], do you have to do that?" Without embellishing, just leave the question hanging, as a way to change the tone, and hopefully call out more mature behavior in her son.  Back to the housing issue, says she met with attorney, establishing a Medicaid trust, and had some discussion of financial planning, including   the possibility of using home equity to stabilize her finances.  Is paying off a home equity loan currently (about $7K) but not an emergency.  Has a rolling walker, some worries about using it downhill on her driveway, but otherwise helpful for getting around.  Has housekeeping come in periodically.  All told, sounds like she is more capable of working out healthy living  at home than before, and allowed that she has options.  Encouraged look into some degree of home companion service, if she can muster herself to trust it, but otherwise still look into what independent living would look like, as well as renting out her home for a stabilizing source of income.    Also had a realization about her horrifying dream, mentioned last time, that there probably is life after death, she just didn't experience it.  Affirmed return of her faith, and willingness to apply herself back to problems in living.  Therapeutic modalities: Cognitive Behavioral Therapy, Solution-Oriented/Positive Psychology, Ego-Supportive, Faith-sensitive, and Assertiveness/Communication  Mental Status/Observations:  Appearance:   Not assessed     Behavior:  Appropriate  Motor:  Not assessed  Speech/Language:   Clear and Coherent  Affect:  Not assessed  Mood:  anxious and depressed  Thought process:  circumstantial  Thought content:    worry  Sensory/Perceptual disturbances:    grossly intact  Orientation:  grossly intact  Attention:  grossly intact    Concentration:  grossly intact  Memory:  grossly intact  Insight:    Good  Judgment:   Fair  Impulse Control:  Fair   Risk Assessment: Danger to Self: No Self-injurious Behavior: No Danger to Others: No Physical Aggression / Violence: No Duty to Warn: No Access to Firearms a concern: No  Assessment of progress:  progressing  Diagnosis:   ICD-10-CM   1. Major depressive disorder, recurrent episode, moderate (HCC)  F33.1     2. Generalized anxiety disorder  F41.1     3. 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 History of CVA (cerebrovascular accident)  Z86.73     5. Noncompliance with medication regimen  Z91.148     6. 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  G31.84     7. Nicotine dependence, cigarettes, with unspecified nicotine-induced disorders  F17.219     8. PVD (peripheral vascular  disease) (HCC)  I73.9     9. Idiopathic pulmonary fibrosis (HCC) (per chart; pt self-describes COPD)  J84.112     10. At risk of disease - UTI, decubitus ulcer, others  Z91.89     11. Relationship problem between parent and child  Z62.820     12. Chronic respiratory failure with hypoxia (HCC)  J96.11      Plan:  Self-care issues -- Prioritize personal health care over all concerns about family relationships, respect or lack thereof, and financial concerns.  Self-affirm that higher care can still be both necessary and affordable, and do not talk self out of engaging treatment and relocation based on imagined financial issues.  Collaborate with medical social worker and other home health if/when PCP set them up.  Begin looking into options and costs for independent living, to include both an elder community or care facility and for-hire personal companion service.  both property manager to assess renting the house and independent living environments to assess costs and know how to she can get her needs met if health deteriorates.  Upon further evidence of failure to manage or   risk of more serious self-neglect, call Adult Protective Services. Health -- Abide by medical advice for chronic, serious conditions, maintain timely primary care, and seek help promptly for breathing issues or signs of cardiovascular or cerebrovascular issues Family relations -- Offer to [name] while timely therapeutic advice to make a nonprovocation and timeout agreement and practice nonprovocative response to harshness or verbal abuse.  Available to meet together with son if needed. Cognitive impairment -- Monitor for deterioration, consequential delusions Other recommendations/advice as may be noted above Continue to utilize previously learned skills ad lib Maintain medication as prescribed and work faithfully with relevant prescriber(s) if any changes are desired or seem indicated Call the clinic on-call service,  988/hotline, 911, or present to [facility] or ER if any life-threatening psychiatric crisis Return for time as already scheduled, put on [place] list. Already scheduled visit in this office 02/01/2023.  Chidubem Chaires, PhD Jenny Somtochukwu Woollard, PhD LP Clinical Psychologist, [facility] [address], [address] [place], [place] [address] (o) [phone] 

## 2023-02-01 ENCOUNTER — Encounter: Payer: Self-pay | Admitting: Psychiatry

## 2023-02-01 ENCOUNTER — Ambulatory Visit (INDEPENDENT_AMBULATORY_CARE_PROVIDER_SITE_OTHER): Payer: Medicare Other | Admitting: Psychiatry

## 2023-02-01 DIAGNOSIS — F5105 Insomnia due to other mental disorder: Secondary | ICD-10-CM

## 2023-02-01 DIAGNOSIS — F331 Major depressive disorder, recurrent, moderate: Secondary | ICD-10-CM

## 2023-02-01 DIAGNOSIS — G4721 Circadian rhythm sleep disorder, delayed sleep phase type: Secondary | ICD-10-CM | POA: Diagnosis not present

## 2023-02-01 DIAGNOSIS — F411 Generalized anxiety disorder: Secondary | ICD-10-CM

## 2023-02-01 DIAGNOSIS — Z8673 Personal history of transient ischemic attack (TIA), and cerebral infarction without residual deficits: Secondary | ICD-10-CM

## 2023-02-01 DIAGNOSIS — G3184 Mild cognitive impairment, so stated: Secondary | ICD-10-CM

## 2023-02-01 MED ORDER — ASENAPINE MALEATE 2.5 MG SL SUBL: 2.5000 mg | SUBLINGUAL_TABLET | Freq: Every evening | SUBLINGUAL | 1 refills | Status: DC

## 2023-02-01 NOTE — Progress Notes (Signed)
[name] [id number] 25-Dec-1945 77 y.o.   Virtual Visit via Telephone Note  I connected with pt by telephone and verified that I am speaking with the correct person using two identifiers.   I discussed the limitations, risks, security and privacy concerns of performing an evaluation and management service by telephone and the availability of in person appointments. I also discussed with the patient that there may be a patient responsible charge related to this service. The patient expressed understanding and agreed to proceed.  I discussed the assessment and treatment plan with the patient. The patient was provided an opportunity to ask questions and all were answered. The patient agreed with the plan and demonstrated an understanding of the instructions.   The patient was advised to call back or seek an in-person evaluation if the symptoms worsen or if the condition fails to improve as anticipated.  I provided 30 minutes of non-face-to-face time during this encounter. The patient was located at home and the provider was located office. Session from 330-400 Televisit bc pt cannot drive bc of eyes  Subjective:   Patient ID:  [name] is a 77 y.o. (DOB 09/27/1945) female.  Chief Complaint:  Chief Complaint  Patient presents with   Follow-up   Depression   Anxiety   Memory Loss   Stress    Depression        Associated symptoms include decreased concentration and fatigue.  Associated symptoms include no suicidal ideas.  Past medical history includes anxiety.   Anxiety Symptoms include decreased concentration, dizziness, nausea, nervous/anxious behavior and shortness of breath. Patient reports no confusion, palpitations or suicidal ideas.     [name] presents to the office today for follow-up of depression and anxiety and MCI.    at visit January 09, 2019.  She was having problems with restless legs and ropinirole was increased to 3 mg every afternoon.  There were no  other med changes.  visit Feb 05, 2019.  She was having mixture of depression and anxiety and chronic insomnia with delayed sleep phase.  For the reasons mentioned at the last visit she was started on risperidone 0.25 mg nightly.  She was maintained on duloxetine 90 mg, clonazepam 1 mg nightly, lamotrigine 150 twice daily, and ropinirole 2 mg 1-1/2 tablets nightly for restless legs.  visit September 2020.  She had stopped the risperidone because she only took it a few days.  She felt nausea and spaced out on it.  No other meds were changed.  Last visit December 2020.  The following was noted: Doing very poorly.  Not getting dressed and not out of the house for 5 days.  Hard to walk with pain.  Falling apart overnight.  Feet hurt at night.  Worries over poor circulation to toes.  Placed on blood thinners.  Continued health problems with COPD.  Energy is low and hard time walking.  Arthritis in back.  House on the market and that's stressful.  Sx largely unchanged but "it's circumstances".  Planning to move to [facility] but ambivalent.  Brother in law lives next door and wants her to stay here.  Also stressed over politics and social things.   Had period of crippling nausea and cut all meds back in 1/2.  Thinks it's mental issue.  Nausea is some better with dose reduction but is more depressed.  Plan: Use pill box to improve compliance. DT nausea  split meds but increase duloxetine 30 mg BID and lamotrigine 75  mg BID. She's been on lower dose DT nausea but is more depressed.  January 02, 2020 appointment, the following is noted: She got septic with a UTI and then was admitted to [facility] for rehab. Home now.  Getting stronger.  Having to take nausea meds. Also diarrhea.  Would like to be on higher dose of meds but can't tolerate it.  Still on some prednisone.  Pending cardiology with CHF.  Also pulmonology. Sleep is pretty good with meds.   No meds changed.  04/26/20 appt with the following noted:   Missed a lot of med DT nausea.  Back on duloxetine 60 and lamotrigine 75 mg for a week.  Was more depressed and dizzy off the meds. Nausea comes and goes in waves but could be any time of day but maybe worse in the evening. Eats poorly usually.  If feels bad doesn't want to cook.   Pepcid helps nausea.  Zofran doesn't help. Lately more trouble falling asleep.  Plan: Rec trial mirtazapine 30 mg HS to help with depression and potentially nausea hopefully.  Nausea is interfering with depression treatment.  08/04/20 appt with following noted: Took mirtazapine 30 mg for a week and felt like a zombie with jitteriness outside.  Didn't help nausea much.  Noticing a pattern with nausea related to constipation and bowel problems.  Not ready to take lamotrigine bc it's hard to swallow but felt emotionally better when on it.  Wonders if bipolar bc jumps from topic to topic and talks a lot. Wants to stay on clonazepam to help her sleep.  Aware of memory concerns. Not taking lorazepam but not sure why she isn't.  Thinks it doesn't work as well as clonazepam.  Says she's taking clonazepam at night now. Plan;  Restart lamotrigine at 25 mg and use the small tablets to try to help mood and make it easier to swallow.  Purpose to help mood.  She felt it helped. Will leave duloxetine at 30 low dose. DC mirtazapine DT SE  10/11/2020 appointment with the following noted:  Started and stopped lamotrigine bc not sticking with schedule so just up to lamotrigine 50 mg daily. RLS requires ropinirole.  Sometimes forgets it and has to keep moving.    Periods of being horrible condition with depression but not suicidal.  Hasnt' been that bad lately.  Still grieving [name] and her sister.  Taking a long time.  Watch too much TV.  Sister [name] was her best friend.   Sometimes feels her presence. No energy to move back to [place] but son encourages it.  Procrastinates it bc moving is overwhelming.  Eating OK.  Enjoyed seeing son  and gkids.   Nausea better but comes and goes. Plan: Continue lamotrigine increase to 100 mg as recommended and use the small tablets to try to help mood and make it easier to swallow.  Purpose to help mood.  She felt it helped.  01/03/2021 appointment with following noted: Up some times and down some times.  Manages.  Kind of average. Not as depressed as in the past.  Still getting dizzy easily.  Sleep hours getting worse instead of bettter.  Once of twice weekly stays up all night and then naps.  Doesn't work out well for her body. Still tends to nausea but variable regardless of what she takes.  Most days consistent with lamotrigine.  Easier to swallow the small lamotrigine 25 vs 100 mg tablets. Don't feel the same since the Sepsis. Going to  get driver's license renewed tomorrow. RLS managed with ropinirole.   Sleep pretty good, except irregular hours.Pt reports that mood is Depressed worse at night and describes anxiety as worse and moderate in the same dose for depression but comes and goes.  She wonders if she might be bipolar like her sister.She is also having problems with irritability. Anxiety symptoms include: Excessive Worry, Panic Symptoms,. Pt reports sleep is more regular and consistent with melatonin 10 mg nightly.. Pt reports that appetite is decreased. Pt reports that energy is poor and loss of interest or pleasure in usual activities, poor motivation and withdrawn from usual activities. Concentration is scattered.  Suicidal thoughts:  denied by patient. B In Law next door.   Increased ropinirole helped RLS. Plan: No med changes  04/12/2021 appointment with the following noted: Had to stop driving bc of dizzy.  Eyes flutter.  Balance not always good.  Back pain. Fighting depression.  Fight with [place] over [place] moving to [place] and doesn't want to help. Still in [place].  Was hoping to move back to [place]. Lonely here and would like support of family. Otherwise feels better than last year.   Needs son to help her move there and doesn't understand why. Not profoundly depressed but is down.   Lately increased clonazepam to 0.5 mg just at night for anxiety and helps her sleep better and have less anxious thoughts.   Not great sleep.  Eats frozen foods. B in law helps her. Wants to increase duloxetine to 60 mg daily but split dose bc of nausea easily. Plan: Wants to increase duloxetine to 60 mg daily but split dose bc of nausea easily.  06/16/2021 appt noted: Increased duloxetine to 60 mg daily and thinks it helped a little better.  But depression worse with family stress going on.  Son gets mad at her and yells.  Keeping GS from her and this is upsetting.   Can talk with him over the phone.  D-in-law keeping GS from her and it's wrong.  Son blames her but that's not true.   Doesn't think she's strong enough to pack up and move to [place].  Chronic dizziness and risk of falling.  Shuffles too much.   She does get online socially.   Pays attention to the news.   Satisfied with meds. Drinks diet Pepsi and coffee up until dinner. Plan: no med changes  11/03/21 appt noted: Hard to walk and probably needs TKR but no one to take care of her locally. Dropped duloxetine to 30 mg daily and lamotrigine to 25 mg 2 daily. Still on ropinirole 2 mg PM,  clonazepam 0.5 mg BID  No SE Anxiety cycles but lately it has calmed down.   Son visits from [place] every 6 weeks which helps her a lot.  Occ brings 77 yo GS. Other 2 GD are married. Was very close to her sister who has been dead 4 years and still grieving.  Her H still helps her with chores. H passed 3 years ago. Falls asleep on couch and sometimes not in bed.  Pattern irregular chronically. Neck pain. RLS managed if taken early enough.  01/17/22 appt noted: Son comes to check on her every 6 weeks.   Recognizes missing meds bc hates swallowing anything.  Also forgets things.   Chronic insomnia and disrupted cycle since lost her H and then  her longterm hosp with illness. Can't drive DT eyesight. B in law will driver her places and still helps her. Ongoing  depression.  Not sure how much longer she can stay at home and care for herself.  Weaker and balance not great.  Simple things like folding clothes is harder.   Chronic nausea. Overall satisfied with meds when takes them properly and doesn't really want to change.  Fear of fire.  08/29/22 appt noted: About the same.  Hard time swallowing meds. BC chronic nausea.  Stomach very sensitive and it has been since a little girl.   Kind of housebound.   Need something for depression and without antideprssants it is worse.  Depression is not severe all day long.  Worse at night.  Watches TV constantly to take her mind off troublesome thoughts.   Really upset over politics and her son has become Republican and that makes her feel terrible.  He swears at her and tells her she's a bad mother.   Taking duloxetine 30 mg usually HS.  No lamotrigine.  Still on clonazepam 1 mg HS, ropinirole 1 mg PM. Doesn't think she could use liquid antidepressants.   She doesn't have SE with clonazepam 1 mg HS.  Not over sedated with it or balance any worse.   Not so angry now but has been at times.    12/05/22 appt noted: Been to ER twice this month for the same thing.  Lost strength suddenly and then body got hot.  Passed out.  Strange emotional feeling of being in pain but not real pain.  Sense of doom lasting 45 mins.  No trigger for the first episode but after argument with son the second time.  No history of panic. Really can't go places bc she is weak.   Missing some meds DT nausea.  Leading to more depression. Son comes monthly for 4 days.  Sometimes it is good and other times not.  Can't live with son bc of his wife's problems. Working on getting caregiver.  02/01/23 appt noted: Never able to get Saphris from pharmacy.  Was told it was OTC. Meds: duloxetine 30 mg daily and ropinirole 1 mg PM, no  lamotrigine as far as she knows. CC trouble sleeping with not enough hours, irregular schedule.  Causing her to feel weaker and trouble walking and focus problems. Still anxiety and stress.  Need some rest.  So sleep depreived it's ridiculous.  Her sister had bipolar disorder.  Past Psychiatric Medication Trials: Lithium 300 insomnia, Depakote, olanzapine 2.5,  lamotrigine 300, ropinirole as high as 2 mg 3 times daily for restless legs,  Aricept poorly tolerated,  Abilify side effects,  Fluoxetine history poop out, sertraline, paroxetine, Lexapro, duloxetine 90,  Mirtazapine 30 zombie Donepezil SE clonazepam Long psychiatric history.  Sister bipolar. I have never prescribed lorazepam nor Xanax  Review of Systems:  Review of Systems  Constitutional:  Positive for fatigue.  Eyes:  Positive for visual disturbance.  Respiratory:  Positive for shortness of breath.   Cardiovascular:  Negative for palpitations.       Claudication   Gastrointestinal:  Positive for nausea. Negative for abdominal pain.  Musculoskeletal:  Positive for back pain, gait problem and neck pain.  Neurological:  Positive for dizziness and weakness.  Psychiatric/Behavioral:  Positive for decreased concentration, dysphoric mood and sleep disturbance. Negative for agitation, behavioral problems, confusion, hallucinations, self-injury and suicidal ideas. The patient is nervous/anxious. The patient is not hyperactive.    LEG surgery for claudication Jan 3  Medications: I have reviewed the patient's current medications.  Current Outpatient Medications  Medication Sig Dispense Refill  albuterol (VENTOLIN HFA) 108 (90 Base) MCG/ACT inhaler Inhale 1-2 puffs into the lungs every 6 (six) hours as needed for wheezing or shortness of breath. 18 g 2   amLODipine (NORVASC) 10 MG tablet 1 tablet Orally Once a day for 30 day(s)     clonazePAM (KLONOPIN) 1 MG tablet PRN     DULoxetine (CYMBALTA) 30 MG capsule Take 1 capsule (30  mg total) by mouth 2 (two) times daily. (Patient taking differently: Take 30 mg by mouth daily.) 180 capsule 1   famotidine (PEPCID) 20 MG tablet One at bedtime 30 tablet 11   famotidine (PEPCID) 20 MG tablet 1 tablet at bedtime as needed Orally Once a day for 30 day(s)     linaclotide (LINZESS) 145 MCG CAPS capsule Take 1 capsule (145 mcg total) by mouth 2 (two) times daily. 60 capsule 3   Magnesium Hydroxide (DULCOLAX PO) Take by mouth as needed.     Melatonin 10 MG TABS Take 20 mg by mouth at bedtime.     omeprazole (PRILOSEC) 20 MG capsule Take 1 capsule (20 mg total) by mouth daily. 14 capsule 1   rOPINIRole (REQUIP) 1 MG tablet 1 tablet 1 to 3 hours before bedtime Orally Once a day for 30 day(s)     rOPINIRole (REQUIP) 2 MG tablet TAKE 1 TABLET(2 MG) BY MOUTH AT BEDTIME 60 tablet 0   Senna EXTR Take by mouth as needed.     triamcinolone cream (KENALOG) 0.1 % Apply 1 Application topically 2 (two) times daily. 80 g 0   XARELTO 20 MG TABS tablet TAKE 1 TABLET(20 MG) BY MOUTH DAILY WITH SUPPER 30 tablet 11   Asenapine Maleate (SAPHRIS) 2.5 MG SUBL Place 1 tablet (2.5 mg total) under the tongue at bedtime. 30 tablet 1   lamoTRIgine (LAMICTAL) 25 MG tablet Take 3 tablets (75 mg total) by mouth daily. (Patient not taking: Reported on 02/01/2023) 270 tablet 1   No current facility-administered medications for this visit.    Medication Side Effects: None unless nausea  Allergies: No Known Allergies  Past Medical History:  Diagnosis Date   AAA (abdominal aortic aneurysm) (HCC)    3.1 cm 07/08/18, 3 year follow-up recommended; possible 3 cm AAA by aortogram 09/13/18   Anemia    PMH   Appendicitis with abscess    07/08/18, s/p perc drain; resolved 07/30/18 by CT   Arthritis    Bipolar disorder (HCC)    Cerebrovascular disease    intra and extracranial vascular dx per MRI 4/11, neurology rec strict CVRF control   Colonic inertia    Constipation    chronic;severe   Coronary artery disease     Depression    Duodenitis    EKG abnormalities    changes, stress test neg (false EKG changes)   Gastritis    GERD (gastroesophageal reflux disease)    Hypertension    Hypothyroid 01/16/2014   Meningioma (HCC) 10/21/2013   PAD (peripheral artery disease) (HCC)    Psoriasis    sees derm   Stroke [facility])    Wears dentures    Wears glasses     Family History  Problem Relation Age of Onset   Breast cancer Mother        metastisis to bones   Cancer Sister 60       spinal   Alcohol abuse Brother    Heart disease Brother    Lung cancer Brother        smoked   Diabetes  Son    Heart disease Maternal Aunt    Heart disease Other        grandfather    Colon cancer Neg Hx    Esophageal cancer Neg Hx    Stomach cancer Neg Hx     Social History   Socioeconomic History   Marital status: Widowed    Spouse name: Not on file   Number of children: 1   Years of education: Not on file   Highest education level: Not on file  Occupational History   Occupation: retired  Tobacco Use   Smoking status: Every Day    Packs/day: 0.50    Years: 60.00    Additional pack years: 0.00    Total pack years: 30.00    Types: Cigarettes    Last attempt to quit: 11/10/2019    Years since quitting: 3.2   Smokeless tobacco: Never  Vaping Use   Vaping Use: Former  Substance and Sexual Activity   Alcohol use: Yes    Alcohol/week: 0.0 standard drinks of alcohol    Comment: yes on occassion   Drug use: No   Sexual activity: Not Currently  Other Topics Concern   Not on file  Social History Narrative   Brother in law Mr Cranston Neighbor (one of my patients)   Lives w/ husband       Social Determinants of Health   Financial Resource Strain: Low Risk  (03/23/2021)   Overall Financial Resource Strain (CARDIA)    Difficulty of Paying Living Expenses: Not hard at all  Food Insecurity: No Food Insecurity (03/23/2021)   Hunger Vital Sign    Worried About Running Out of Food in the Last Year: Never true     Ran Out of Food in the Last Year: Never true  Transportation Needs: No Transportation Needs (03/23/2021)   PRAPARE - Administrator, Civil Service (Medical): No    Lack of Transportation (Non-Medical): No  Physical Activity: Inactive (03/23/2021)   Exercise Vital Sign    Days of Exercise per Week: 0 days    Minutes of Exercise per Session: 0 min  Stress: Not on file  Social Connections: Unknown (10/24/2018)   Social Connection and Isolation Panel [NHANES]    Frequency of Communication with Friends and Family: Patient declined    Frequency of Social Gatherings with Friends and Family: Patient declined    Attends Religious Services: Patient declined    Database administrator or Organizations: Patient declined    Attends Banker Meetings: Patient declined    Marital Status: Patient declined  Intimate Partner Violence: Unknown (10/24/2018)   Humiliation, Afraid, Rape, and Kick questionnaire    Fear of Current or Ex-Partner: Patient declined    Emotionally Abused: Patient declined    Physically Abused: Patient declined    Sexually Abused: Patient declined    Past Medical History, Surgical history, Social history, and Family history were reviewed and updated as appropriate.   Please see review of systems for further details on the patient's review from today.   Objective:   Physical Exam:  There were no vitals taken for this visit.  Physical Exam Neurological:     Mental Status: She is alert and oriented to person, place, and time.     Cranial Nerves: No dysarthria.  Psychiatric:        Attention and Perception: She is inattentive. She does not perceive auditory hallucinations.        Mood and Affect: Mood  is anxious and depressed.        Speech: Speech normal. Speech is not slurred.        Behavior: Behavior is not slowed or aggressive. Behavior is cooperative.        Thought Content: Thought content normal. Thought content is not paranoid or delusional.  Thought content does not include homicidal or suicidal ideation. Thought content does not include suicidal plan.        Cognition and Memory: Memory is impaired. She exhibits impaired recent memory.        Judgment: Judgment is not impulsive or inappropriate.     Comments:   Still depressed, anxious and less irritable.  Insight and judgment are fair Talkative Gets meds mixed up at times and changes with unclear reasons at times Causes response failure.     Lab Review:     Component Value Date/Time   NA 135 11/27/2022 1109   NA 135 01/02/2020 1602   K 3.8 11/27/2022 1109   CL 103 11/27/2022 1109   CO2 23 11/27/2022 1109   GLUCOSE 111 (H) 11/27/2022 1109   BUN 25 (H) 11/27/2022 1109   BUN 17 01/02/2020 1602   CREATININE 1.26 (H) 11/27/2022 1109   CREATININE 0.77 07/28/2015 1805   CALCIUM 9.1 11/27/2022 1109   PROT 7.2 11/27/2022 1109   PROT 7.9 01/02/2020 1602   ALBUMIN 3.5 11/27/2022 1109   ALBUMIN 4.2 01/02/2020 1602   AST 16 11/27/2022 1109   ALT 12 11/27/2022 1109   ALKPHOS 67 11/27/2022 1109   BILITOT 0.4 11/27/2022 1109   BILITOT 0.2 01/02/2020 1602   GFRNONAA 43 (L) 11/27/2022 1109   GFRAA 58 (L) 01/02/2020 1602       Component Value Date/Time   WBC 6.3 11/27/2022 1109   RBC 4.67 11/27/2022 1109   HGB 13.6 11/27/2022 1109   HGB 11.9 01/02/2020 1602   HCT 43.1 11/27/2022 1109   HCT 36.4 01/02/2020 1602   PLT 212 11/27/2022 1109   PLT 370 01/02/2020 1602   MCV 92.3 11/27/2022 1109   MCV 90 01/02/2020 1602   MCH 29.1 11/27/2022 1109   MCHC 31.6 11/27/2022 1109   RDW 13.7 11/27/2022 1109   RDW 14.0 01/02/2020 1602   LYMPHSABS 1.2 11/27/2022 1109   LYMPHSABS 2.7 01/02/2020 1602   MONOABS 0.5 11/27/2022 1109   EOSABS 0.1 11/27/2022 1109   EOSABS 0.1 01/02/2020 1602   BASOSABS 0.0 11/27/2022 1109   BASOSABS 0.0 01/02/2020 1602    No results found for: "POCLITH", "LITHIUM"   No results found for: "PHENYTOIN", "PHENOBARB", "VALPROATE", "CBMZ"    .res Assessment: Plan:    Major depressive disorder, recurrent episode, moderate (HCC) - Plan: Asenapine Maleate (SAPHRIS) 2.5 MG SUBL  Delayed sleep phase syndrome  Insomnia due to mental condition  Generalized anxiety disorder - Plan: Asenapine Maleate (SAPHRIS) 2.5 MG SUBL  History of CVA (cerebrovascular accident)  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  Likely episodes of panic.    [name] has some chronic depression and chronic irregular sleep patterns which have been resistant to treatment.  Her chronic depression and anxiety are complicated now by double grief of loss of her sister and her husband.  She is lonely and mood affected by recent severe health problems.   Chronic depression and noncompiance with meds is major contribution to her depression bc meds won't work without consistency. Chronic issues with compliance in part DT chronic nausea. Disc compliance again. Gets meds mixed up at  times and changes with unclear reasons at times Causes response failure. This might be triggering panic episodes though she denies episodes of panic.  Chronic N and dizziness are ongoing problems. And interfere with compliance.  Stopped lamotrigine ? Reason but not clearly worse.  Cont counseling for grief, depression, anxiety, and health crises.  Supportive therapy dealing with health problems. Try to watch more positive things and not just news.  Dizziness is likely DT prior strokes and not meds.  She reduced duloxetine to 30 mg daily . Cannot think of a sublingual antidepressant and selegiline would not be safe for her.    Had to increase ropinirole 3 mg for RLS.  This has better controlled her restless legs.  Doing ok right now. No coffee after dinner.   Disc SE incl hallucinations and dizziness.   This high dosage appears medically necessary.  Call if this fails and will consider doing something with the clonazepam either increasing it or  perhaps switching to another benzodiazepine which might have less cognitive problems.  Such as lorazepam Continue clonazepam at LED at 0.25-0.5 mg BID prn anxiety. We discussed the short-term risks associated with benzodiazepines including sedation and increased fall risk among others.  Discussed long-term side effect risk including dependence, potential withdrawal symptoms, and the potential eventual dose-related risk of dementia.  But recent studies from 2020 dispute this association between benzodiazepines and dementia risk. Newer studies in 2020 do not support an association with dementia.  Emphasized importance and proper nutrition and sleep and protein.  Doesn't eat much meat bc constipation but will eat fish.  Discussed how protein is necessary for the proper response to depression medicines.  Can't cook easily.    Sleep hygiene and disc her delayed sleep phase which is chronic at this time.    Trial Saphris SL 2.5 mg HS for mood, sleep and anxiety.  She did not get it when RX before.  Will hav estaff try to help her get it. Pt needs SL if available bc N is severe when takes meds.  She has some difficulty understanding the med changes and keeping up with them. Discussed potential metabolic side effects associated with atypical antipsychotics, as well as potential risk for movement side effects. Advised pt to contact office if movement side effects occur.   Needs some frequency of visits for supportive therapy and self care direction DT ongoing medical problems  FU 2 mos.  [name], MD, DFAPA  Please see After Visit Summary for patient specific instructions.  Future Appointments  Date Time Provider Department Center  03/02/2023  3:00 PM Robley Fries, PhD [facility] None      No orders of the defined types were placed in this encounter.      -------------------------------

## 2023-02-04 DIAGNOSIS — L301 Dyshidrosis [pompholyx]: Secondary | ICD-10-CM | POA: Diagnosis not present

## 2023-02-04 DIAGNOSIS — R011 Cardiac murmur, unspecified: Secondary | ICD-10-CM | POA: Diagnosis not present

## 2023-02-04 DIAGNOSIS — R319 Hematuria, unspecified: Secondary | ICD-10-CM | POA: Diagnosis not present

## 2023-02-04 DIAGNOSIS — R109 Unspecified abdominal pain: Secondary | ICD-10-CM | POA: Diagnosis not present

## 2023-02-05 DIAGNOSIS — R319 Hematuria, unspecified: Secondary | ICD-10-CM | POA: Diagnosis not present

## 2023-02-05 DIAGNOSIS — R011 Cardiac murmur, unspecified: Secondary | ICD-10-CM | POA: Diagnosis not present

## 2023-02-05 DIAGNOSIS — L301 Dyshidrosis [pompholyx]: Secondary | ICD-10-CM | POA: Diagnosis not present

## 2023-02-05 DIAGNOSIS — R109 Unspecified abdominal pain: Secondary | ICD-10-CM | POA: Diagnosis not present

## 2023-02-06 NOTE — Progress Notes (Unsigned)
Cardiology Office Note   Date:  02/07/2023   ID:  [name], DOB May 19, 1946, MRN [id number]  PCP:  Shade Flood, MD  Cardiologist:   None Referring:  Shade Flood, MD  No chief complaint on file.     History of Present Illness: [name] is a 77 y.o. female who is referred by Shade Flood, MD  for evaluation of murmur.    She has a history of PVD and is status post left fem pop.   Echo in 2021 demonstrated NL EF .  She had moderately elevated pulmonary pressure.   She had perfusion study in 2020.  I saw her many years ago.  She saw Dr. Allyson Sabal in 2020.   She has not seen anybody in a while and she is basically not taking any of her medicines.  At one point in time she was on Xarelto currently for atheroemboli and this was prescribed by Dr. Darrick Penna but she has been taking this.  She has not been taking any of her blood pressure medicines.  She was noted recently to have a bladder infection and she went to be seen about this and was told she had a heart murmur.  This had previously been noted with some aortic stenosis and her hyperdynamic function causing outflow obstruction.  She does not feel chest pressure, neck or arm discomfort.  However, she is profoundly weak she said since she had sepsis years ago.  She walks with a walker.  Her gait is unsteady.  She has frailty.  She has some chronically lower extremity swelling.  She does not describe PND or orthopnea.  She does not have palpitations.  In March she did have some presyncope and I reviewed ER records but there was no clear etiology.  She was very hypertensive at that time.  Past Medical History:  Diagnosis Date   AAA (abdominal aortic aneurysm) (HCC)    3.1 cm 07/08/18, 3 year follow-up recommended; possible 3 cm AAA by aortogram 09/13/18   Anemia    PMH   Appendicitis with abscess    07/08/18, s/p perc drain; resolved 07/30/18 by CT   Arthritis    Bipolar disorder (HCC)    Cerebrovascular disease     intra and extracranial vascular dx per MRI 4/11, neurology rec strict CVRF control   Colonic inertia    Constipation    chronic;severe   Depression    Duodenitis    Gastritis    GERD (gastroesophageal reflux disease)    Hypertension    Hypothyroid 01/16/2014   Meningioma (HCC) 10/21/2013   PAD (peripheral artery disease) (HCC)    Psoriasis    sees derm   Wears dentures    Wears glasses     Past Surgical History:  Procedure Laterality Date   ABDOMINAL AORTOGRAM W/LOWER EXTREMITY N/A 09/13/2018   Procedure: ABDOMINAL AORTOGRAM W/LOWER EXTREMITY;  Surgeon: Sherren Kerns, MD;  Location: [facility];  Service: Cardiovascular;  Laterality: N/A;   arthroscopy  04/2010   Right knee   CATARACT EXTRACTION W/ INTRAOCULAR LENS  IMPLANT, BILATERAL     COLONOSCOPY     FEMORAL-POPLITEAL BYPASS GRAFT  10/22/2018   FEMORAL-POPLITEAL BYPASS GRAFT Left 10/22/2018   Procedure: LEFT FEMORAL TO BELOW THE KNEE POPLITEAL ARTERY BYPASS GRAFT;  Surgeon: Sherren Kerns, MD;  Location: [facility] OR;  Service: Vascular;  Laterality: Left;   I & D EXTREMITY Left 11/08/2018   Procedure: IRRIGATION AND DEBRIDEMENT EXTREMITY  Left Leg;  Surgeon: Sherren Kerns, MD;  Location: [facility] OR;  Service: Vascular;  Laterality: Left;   IR RADIOLOGIST EVAL & MGMT  07/30/2018   MULTIPLE TOOTH EXTRACTIONS     TUBAL LIGATION       Current Outpatient Medications  Medication Sig Dispense Refill   aspirin EC 81 MG tablet Take 81 mg by mouth daily. Swallow whole.     albuterol (VENTOLIN HFA) 108 (90 Base) MCG/ACT inhaler Inhale 1-2 puffs into the lungs every 6 (six) hours as needed for wheezing or shortness of breath. (Patient not taking: Reported on 02/07/2023) 18 g 2   amLODipine (NORVASC) 5 MG tablet Take 1 tablet (5 mg total) by mouth daily. (Patient not taking: Reported on 02/07/2023) 90 tablet 3   Asenapine Maleate (SAPHRIS) 2.5 MG SUBL Place 1 tablet (2.5 mg total) under the tongue at bedtime. (Patient not taking:  Reported on 02/07/2023) 30 tablet 1   clonazePAM (KLONOPIN) 1 MG tablet PRN (Patient not taking: Reported on 02/07/2023)     DULoxetine (CYMBALTA) 30 MG capsule Take 1 capsule (30 mg total) by mouth 2 (two) times daily. (Patient not taking: Reported on 02/07/2023) 180 capsule 1   famotidine (PEPCID) 20 MG tablet One at bedtime (Patient not taking: Reported on 02/07/2023) 30 tablet 11   famotidine (PEPCID) 20 MG tablet 1 tablet at bedtime as needed Orally Once a day for 30 day(s) (Patient not taking: Reported on 02/07/2023)     lamoTRIgine (LAMICTAL) 25 MG tablet Take 3 tablets (75 mg total) by mouth daily. (Patient not taking: Reported on 02/07/2023) 270 tablet 1   linaclotide (LINZESS) 145 MCG CAPS capsule Take 1 capsule (145 mcg total) by mouth 2 (two) times daily. (Patient not taking: Reported on 02/07/2023) 60 capsule 3   Magnesium Hydroxide (DULCOLAX PO) Take by mouth as needed. (Patient not taking: Reported on 02/07/2023)     Melatonin 10 MG TABS Take 20 mg by mouth at bedtime. (Patient not taking: Reported on 02/07/2023)     omeprazole (PRILOSEC) 20 MG capsule Take 1 capsule (20 mg total) by mouth daily. (Patient not taking: Reported on 02/07/2023) 14 capsule 1   rOPINIRole (REQUIP) 1 MG tablet 1 tablet 1 to 3 hours before bedtime Orally Once a day for 30 day(s) (Patient not taking: Reported on 02/07/2023)     rOPINIRole (REQUIP) 2 MG tablet TAKE 1 TABLET(2 MG) BY MOUTH AT BEDTIME (Patient not taking: Reported on 02/07/2023) 60 tablet 0   Senna EXTR Take by mouth as needed. (Patient not taking: Reported on 02/07/2023)     triamcinolone cream (KENALOG) 0.1 % Apply 1 Application topically 2 (two) times daily. (Patient not taking: Reported on 02/07/2023) 80 g 0   No current facility-administered medications for this visit.    Allergies:   Patient has no known allergies.    Social History:  The patient  reports that she has been smoking cigarettes. She has a 30.00 pack-year smoking history. She has never  used smokeless tobacco. She reports current alcohol use. She reports that she does not use drugs.   Family History:  The patient's family history includes Alcohol abuse in her brother; Breast cancer in her mother; Cancer (age of onset: 88) in her sister; Diabetes in her son; Heart disease in her brother, maternal aunt, and another family member; Lung cancer in her brother.    ROS:  Please see the history of present illness.   Otherwise, review of systems are positive for none.  All other systems are reviewed and negative.    PHYSICAL EXAM: VS:  BP (!) 185/103 (BP Location: Right Arm, Patient Position: Sitting, Cuff Size: Normal)   Pulse 71   Ht 5\' 7"  (1.702 m)   Wt 170 lb (77.1 kg) Comment: APPROX.  SpO2 97%   BMI 26.63 kg/m  , BMI Body mass index is 26.63 kg/m. GENERAL: Very frail appearing HEENT:  Pupils equal round and reactive, fundi not visualized, oral mucosa unremarkable NECK:  No jugular venous distention, waveform within normal limits, carotid upstroke brisk and symmetric, bilateral carotid bruits, no thyromegaly LYMPHATICS:  No cervical, inguinal adenopathy LUNGS:  Clear to auscultation bilaterally BACK:  No CVA tenderness CHEST:  Unremarkable HEART:  PMI not displaced or sustained,S1 and S2 within normal limits, no S3, no S4, no clicks, no rubs, 2 out of 6 apical systolic murmur radiating up the aortic outflow tract, no diastolic murmurs ABD:  Flat, positive bowel sounds normal in frequency in pitch, no bruits, no rebound, no guarding, no midline pulsatile mass, no hepatomegaly, no splenomegaly EXT:  2 plus pulses upper and diminished dorsalis pedis and posterior tibialis bilateral, no edema, no cyanosis no clubbing SKIN:  No rashes no nodules NEURO:  Cranial nerves II through XII grossly intact, motor grossly intact throughout PSYCH:  Cognitively intact, oriented to person place and time    EKG:  EKG is ordered today. The ekg ordered today demonstrates sinus rhythm,  rate 71, axis within normal limits, intervals within normal limits, premature ectopic complexes, no acute ST-T wave changes.   Recent Labs: 10/05/2022: B Natriuretic Peptide 185.7 11/27/2022: ALT 12; BUN 25; Creatinine, Ser 1.26; Hemoglobin 13.6; Platelets 212; Potassium 3.8; Sodium 135    Lipid Panel    Component Value Date/Time   CHOL 175 10/24/2017 0846   TRIG 116 11/17/2019 2145   HDL 55 10/24/2017 0846   CHOLHDL 3.2 10/24/2017 0846   VLDL 16 10/24/2017 0846   LDLCALC 104 (H) 10/24/2017 0846   LDLDIRECT 135.6 08/15/2010 1404      Wt Readings from Last 3 Encounters:  02/07/23 170 lb (77.1 kg)  12/21/22 160 lb (72.6 kg)  06/29/22 160 lb (72.6 kg)      Other studies Reviewed: Additional studies/ records that were reviewed today include: Labs. Review of the above records demonstrates:  Please see elsewhere in the note.     ASSESSMENT AND PLAN:  Murmur:    She had a mild gradient previously.  I will repeat an echocardiogram.  PVD: I am going to pursue risk reduction as below.  She is to start aspirin.  Syncope: She had presyncope but I do not think there is a clear cardiac etiology.  She has not had this since March.  No further evaluation  Hypertension: She has been off of all of her medications.  And then start Norvasc 5 mg daily.  She will keep a blood pressure diary needs close follow-up.  Bruit: She will need carotid Dopplers.  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  She has not been on it for years.  I will not restart Xarelto.  She will be on aspirin.  Weakness: I will check TSH.  Other labs are up-to-date.  I think this is just general muscle decline but will defer to her primary provider.  She might benefit from physical therapy.  Current medicines are reviewed at  length with the patient today.  The patient does not have concerns regarding  medicines.  The following changes have been made:  As above  Labs/ tests ordered today include:   Orders Placed This Encounter  Procedures   Lipid panel   TSH   EKG 12-Lead   ECHOCARDIOGRAM COMPLETE   VAS US CAROTID     Disposition:   FU with HTN clinic.      Signed, Rollene Rotunda, MD  02/07/2023 12:00 PM    [facility]

## 2023-02-07 ENCOUNTER — Encounter: Payer: Self-pay | Admitting: Cardiology

## 2023-02-07 ENCOUNTER — Ambulatory Visit: Payer: Medicare Other | Attending: Cardiology | Admitting: Cardiology

## 2023-02-07 ENCOUNTER — Telehealth: Payer: Self-pay | Admitting: Psychiatry

## 2023-02-07 VITALS — BP 185/103 | HR 71 | Ht 67.0 in | Wt 170.0 lb

## 2023-02-07 DIAGNOSIS — I739 Peripheral vascular disease, unspecified: Secondary | ICD-10-CM | POA: Insufficient documentation

## 2023-02-07 DIAGNOSIS — R011 Cardiac murmur, unspecified: Secondary | ICD-10-CM | POA: Insufficient documentation

## 2023-02-07 DIAGNOSIS — E785 Hyperlipidemia, unspecified: Secondary | ICD-10-CM | POA: Diagnosis not present

## 2023-02-07 DIAGNOSIS — R531 Weakness: Secondary | ICD-10-CM | POA: Insufficient documentation

## 2023-02-07 DIAGNOSIS — R55 Syncope and collapse: Secondary | ICD-10-CM | POA: Insufficient documentation

## 2023-02-07 DIAGNOSIS — Z1329 Encounter for screening for other suspected endocrine disorder: Secondary | ICD-10-CM | POA: Diagnosis not present

## 2023-02-07 MED ORDER — AMLODIPINE BESYLATE 5 MG PO TABS: 5.0000 mg | ORAL_TABLET | Freq: Every day | ORAL | 3 refills | Status: AC

## 2023-02-07 NOTE — Patient Instructions (Signed)
Medication Instructions:  START amlodipine 5mg  daily  START aspirin 81mg  daily  *If you need a refill on your cardiac medications before your next appointment, please call your pharmacy*   Lab Work: FASTING Lipid Panel, TSH  If you have labs (blood work) drawn today and your tests are completely normal, you will receive your results only by: MyChart Message (if you have MyChart) OR A paper copy in the mail If you have any lab test that is abnormal or we need to change your treatment, we will call you to review the results.   Testing/Procedures: Your physician has requested that you have an echocardiogram. Echocardiography is a painless test that uses sound waves to create images of your heart. It provides your doctor with information about the size and shape of your heart and how well your heart's chambers and valves are working. This procedure takes approximately one hour. There are no restrictions for this procedure. Please do NOT wear cologne, perfume, aftershave, or lotions (deodorant is allowed). Please arrive 15 minutes prior to your appointment time. Locations: [facility] on [address] off [address]  Your physician has requested that you have a carotid duplex. This test is an ultrasound of the carotid arteries in your neck. It looks at blood flow through these arteries that supply the brain with blood. Allow one hour for this exam. There are no restrictions or special instructions. Locations: Dr. Jenene Slicker office [facility] off [address]    Follow-Up: At [facility], you and your health needs are our priority.  As part of our continuing mission to provide you with exceptional heart care, we have created designated Provider Care Teams.  These Care Teams include your primary Cardiologist (physician) and Advanced Practice Providers (APPs -  Physician Assistants and Nurse Practitioners) who all work together to provide you  with the care you need, when you need it.  We recommend signing up for the patient portal called "MyChart".  Sign up information is provided on this After Visit Summary.  MyChart is used to connect with patients for Virtual Visits (Telemedicine).  Patients are able to view lab/test results, encounter notes, upcoming appointments, etc.  Non-urgent messages can be sent to your provider as well.   To learn more about what you can do with MyChart, go to [email or web address].    Your next appointment:    2-3 weeks with clinical pharmacist for BP follow up    Other Instructions OMRON upper arm BP cuff  Please check your BP at home 1-2 times daily. Ideally check BP about 1-2 hours after taking amlodipine  HOW TO TAKE YOUR BLOOD PRESSURE: Rest 5 minutes before taking your blood pressure. Don't smoke or drink caffeinated beverages for at least 30 minutes before. Take your blood pressure before (not after) you eat. Sit comfortably with your back supported and both feet on the floor (don't cross your legs). Elevate your arm to heart level on a table or a desk. Use the proper sized cuff. It should fit smoothly and snugly around your bare upper arm. There should be enough room to slip a fingertip under the cuff. The bottom edge of the cuff should be 1 inch above the crease of the elbow. Ideally, take 3 measurements at one sitting and record the average.

## 2023-02-07 NOTE — Telephone Encounter (Signed)
Patient lvm at 3:43 stating that she discussed with CC having some issues getting medication at pharmacy. The issue has been solved but price is to high. "Do  you have any other ideas? Ph: [phone]

## 2023-02-08 NOTE — Telephone Encounter (Signed)
Patient cannot afford Saphris and is asking for something in its place.  Two scripts have been sent and she has not filled either of them.

## 2023-02-08 NOTE — Telephone Encounter (Signed)
I gave her the Rx Saphris bc it is not a pill she has to swallow.  The only other option which is sublingual is to retry olanzapine which she tried some years ago.  We can send in olanzpine ODT 2.5 mg , 1 SL QHS.  No other options that are not tablets.  She has chronic nausea and does not tolerate tablets.

## 2023-02-09 ENCOUNTER — Other Ambulatory Visit: Payer: Self-pay | Admitting: Psychiatry

## 2023-02-09 ENCOUNTER — Ambulatory Visit (INDEPENDENT_AMBULATORY_CARE_PROVIDER_SITE_OTHER): Payer: Medicare Other | Admitting: Psychiatry

## 2023-02-09 DIAGNOSIS — I739 Peripheral vascular disease, unspecified: Secondary | ICD-10-CM | POA: Diagnosis not present

## 2023-02-09 DIAGNOSIS — J84112 Idiopathic pulmonary fibrosis: Secondary | ICD-10-CM

## 2023-02-09 DIAGNOSIS — G4721 Circadian rhythm sleep disorder, delayed sleep phase type: Secondary | ICD-10-CM

## 2023-02-09 DIAGNOSIS — F411 Generalized anxiety disorder: Secondary | ICD-10-CM

## 2023-02-09 DIAGNOSIS — Z8673 Personal history of transient ischemic attack (TIA), and cerebral infarction without residual deficits: Secondary | ICD-10-CM | POA: Diagnosis not present

## 2023-02-09 DIAGNOSIS — F5105 Insomnia due to other mental disorder: Secondary | ICD-10-CM | POA: Diagnosis not present

## 2023-02-09 DIAGNOSIS — F331 Major depressive disorder, recurrent, moderate: Secondary | ICD-10-CM | POA: Diagnosis not present

## 2023-02-09 DIAGNOSIS — G3184 Mild cognitive impairment, so stated: Secondary | ICD-10-CM

## 2023-02-09 DIAGNOSIS — Z9189 Other specified personal risk factors, not elsewhere classified: Secondary | ICD-10-CM

## 2023-02-09 DIAGNOSIS — Z62820 Parent-biological child conflict: Secondary | ICD-10-CM

## 2023-02-09 DIAGNOSIS — F17219 Nicotine dependence, cigarettes, with unspecified nicotine-induced disorders: Secondary | ICD-10-CM

## 2023-02-09 MED ORDER — OLANZAPINE 5 MG PO TBDP: 5.0000 mg | ORAL_TABLET | Freq: Every day | ORAL | 0 refills | Status: DC

## 2023-02-09 NOTE — Progress Notes (Signed)
Psychotherapy Progress Note [facility] Marliss Czar, PhD LP  Patient ID: [name])    MRN: [id number] Therapy format: Individual psychotherapy Date: 02/09/2023      Start: 4:09p     Stop: 4:58p     Time Spent: 49 min Location: Telehealth visit -- I connected with this patient by an approved telecommunication method (audio only), with her informed consent, and verifying identity and patient privacy.  I was located at my office and patient at her home.  As needed, we discussed the limitations, risks, and security and privacy concerns associated with telehealth service, including the availability and conditions which currently govern in-person appointments and the possibility that 3rd-party payment may not be fully guaranteed and she may be responsible for charges.  After she indicated understanding, we proceeded with the session.  Also discussed treatment planning, as needed, including ongoing verbal agreement with the plan, the opportunity to ask and answer all questions, her demonstrated understanding of instructions, and her readiness to call the office should symptoms worsen or she feels she is in a crisis state and needs more immediate and tangible assistance.   Session narrative (presenting needs, interim history, self-report of stressors and symptoms, applications of prior therapy, status changes, and interventions made in session) Phone session per schedule, made call 4:09p, went to voicemail, left message to return call when free.  Did so, glad to be in touch.  Has had to deal with widespread eczema and a stubborn bladder infection.  Worried about possible cancer, worried about BP.  Been feeling dizzy sometimes from high diastolics approaching 110.  Found a loud heart murmur, but was reassured by cardiologist, just tests to clarify.  Is off her blood thinner, not sure if it's correct, and increased BP med.  Needs dermatologist for her widespread skin problem.   Offered possibility her skin problem is the same one she began treating in ER a couple years back, and she has a cream for it.  Is monitoring her BP, says O2 is OK.  Drinks a good amount of ginger ale.  Encouraged she pay attention to carb load, as she could be feeding one or more adverse conditions from diabetes to fungal infection to arterial inflammation.  Meanwhile, she has made an affirmative housing decision.  Figures she'll take out a home equity loan [facility], sounds like) to pay some care expenses and start making arrangements to possibly downsize or relocate to an elder community.  Discussed options again, including hired home assistance, independent living (probably dormitory style) with available home assistance and dining plan.  Is aware that she set up a trust with son [name], not entirely sure what her rights and powers are to work with her home equity.  Advised get advice from the trust attorney.  Provided referrals for reputable home care agencies.  [name] is coming to visit next weekend.  Renewed the recommendation to work out an agreement ahead of time with him that they will collaborate on preventing fights, be willing to call and allow timeouts to cool down instead of indulging reactions to each other.  Therapeutic modalities: Cognitive Behavioral Therapy, Solution-Oriented/Positive Psychology, Ego-Supportive, and Engineer, maintenance (IT)  Mental Status/Observations:  Appearance:   Not assessed     Behavior:  Appropriate  Motor:  Not assessed  Speech/Language:   Clear and Coherent  Affect:  Not assessed  Mood:  anxious and depressed  Thought process:  circumstantial  Thought content:    WNL and Rumination  Sensory/Perceptual disturbances:  WNL  Orientation:  Fully oriented  Attention:  Good    Concentration:  Fair  Memory:  grossly intact  Insight:    Good  Judgment:   Fair  Impulse Control:  Good   Risk Assessment: Danger to Self: No Self-injurious Behavior: No Danger  to Others: No Physical Aggression / Violence: No Duty to Warn: No Access to Firearms a concern: No  Assessment of progress:  progressing  Diagnosis:   ICD-10-CM   1. Major depressive disorder, recurrent episode, moderate (HCC)  F33.1     2. Generalized anxiety disorder  F41.1     3. 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 Insomnia due to mental condition  F51.05     5. 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  G31.84     6. History of CVA (cerebrovascular accident)  Z86.73     7. Nicotine dependence, cigarettes, with unspecified nicotine-induced disorders  F17.219     8. PVD (peripheral vascular disease) (HCC)  I73.9     9. Idiopathic pulmonary fibrosis (HCC) (per chart; pt self-describes COPD)  J84.112     10. At risk of disease - UTI, decubitus ulcer, others  Z91.89     11. Relationship problem between parent and child  Z62.820      Plan:  Self-care issues -- Prioritize personal health care over all concerns about family relationships, respect or lack thereof, and financial concerns.  Recommend engage a home care assistant.  Self-affirm that higher care can still be both necessary and affordable, if she will look into the facts of costs and her home equity.  Do not talk self out of engaging treatment and relocation based on imagined financial issues.  Collaborate with medical social worker as available through PCP's office for more help engaging the system.  Begin looking into options and costs for independent living, to include both an elder community or care facility and for-hire personal companion service.  For relocation needs, consider a Investment banker, corporate to assess renting her house vs. selling.  Upon further evidence of failure to manage, or risk of more serious self-neglect, may call Adult Protective Services. Chronic health risks -- Abide by medical advice for chronic, serious conditions, maintain timely primary care, and seek help  promptly for breathing issues or signs of cardiovascular or cerebrovascular issues.  Including but not limited to monitoring BP, monitoring O2, report signs and sxs promptly, apply topical remedies faithfully, and try to reduce carbs. Family relations -- Seek an agreement with [name] to collaborate against mutual tendency to react and spite each other, and use timeout rather than backbiting, harshness, or verbal abuse.  Available to meet together with son if motivated. Cognitive impairment -- Monitor for deterioration, consequential delusions Other recommendations/advice -- As may be noted above.  Continue to utilize previously learned skills ad lib. Medication compliance -- Maintain medication as prescribed and work faithfully with relevant prescriber(s) if any changes are desired or seem indicated. Crisis service -- Aware of call list and work-in appts.  Call the clinic on-call service, 988/hotline, 911, or present to [facility] or ER if any life-threatening psychiatric crisis. Followup -- Return for time as already scheduled.  Next scheduled visit with me 03/02/2023.  Next scheduled in this office 03/02/2023.  Robley Fries, PhD Marliss Czar, PhD LP Clinical Psychologist, [facility] [address] [place], [place] [address] [phone]

## 2023-02-09 NOTE — Telephone Encounter (Signed)
LVM to RC 

## 2023-02-09 NOTE — Telephone Encounter (Signed)
Sent RX

## 2023-02-09 NOTE — Progress Notes (Signed)
Pt having trouble swallowing dt chronic N.  Will use lowest dose of SL olazapine 5 mg HS for TRD and anxiety and insomnia.

## 2023-02-09 NOTE — Telephone Encounter (Signed)
Patient is agreeable to trying the olanzapine. I could not find this dose in Epic, only 5 mg and higher.

## 2023-02-13 ENCOUNTER — Other Ambulatory Visit: Payer: Self-pay

## 2023-02-13 ENCOUNTER — Telehealth: Payer: Self-pay | Admitting: Family Medicine

## 2023-02-13 ENCOUNTER — Emergency Department (HOSPITAL_BASED_OUTPATIENT_CLINIC_OR_DEPARTMENT_OTHER): Payer: Medicare Other

## 2023-02-13 ENCOUNTER — Emergency Department (HOSPITAL_BASED_OUTPATIENT_CLINIC_OR_DEPARTMENT_OTHER)
Admission: EM | Admit: 2023-02-13 | Discharge: 2023-02-13 | Disposition: A | Discharge status: Home / Self Care | Payer: Medicare Other | Attending: Emergency Medicine | Admitting: Emergency Medicine

## 2023-02-13 ENCOUNTER — Encounter (HOSPITAL_BASED_OUTPATIENT_CLINIC_OR_DEPARTMENT_OTHER): Payer: Self-pay | Admitting: Emergency Medicine

## 2023-02-13 DIAGNOSIS — I10 Essential (primary) hypertension: Secondary | ICD-10-CM | POA: Diagnosis not present

## 2023-02-13 DIAGNOSIS — Z79899 Other long term (current) drug therapy: Secondary | ICD-10-CM | POA: Diagnosis not present

## 2023-02-13 DIAGNOSIS — K3189 Other diseases of stomach and duodenum: Secondary | ICD-10-CM | POA: Diagnosis not present

## 2023-02-13 DIAGNOSIS — R531 Weakness: Secondary | ICD-10-CM | POA: Diagnosis not present

## 2023-02-13 DIAGNOSIS — R109 Unspecified abdominal pain: Secondary | ICD-10-CM | POA: Diagnosis present

## 2023-02-13 DIAGNOSIS — D72829 Elevated white blood cell count, unspecified: Secondary | ICD-10-CM | POA: Insufficient documentation

## 2023-02-13 DIAGNOSIS — R079 Chest pain, unspecified: Secondary | ICD-10-CM | POA: Diagnosis not present

## 2023-02-13 DIAGNOSIS — R21 Rash and other nonspecific skin eruption: Secondary | ICD-10-CM | POA: Diagnosis not present

## 2023-02-13 DIAGNOSIS — R1011 Right upper quadrant pain: Secondary | ICD-10-CM | POA: Diagnosis not present

## 2023-02-13 DIAGNOSIS — R0602 Shortness of breath: Secondary | ICD-10-CM | POA: Diagnosis not present

## 2023-02-13 DIAGNOSIS — Z7982 Long term (current) use of aspirin: Secondary | ICD-10-CM | POA: Insufficient documentation

## 2023-02-13 DIAGNOSIS — E871 Hypo-osmolality and hyponatremia: Secondary | ICD-10-CM | POA: Insufficient documentation

## 2023-02-13 DIAGNOSIS — K8020 Calculus of gallbladder without cholecystitis without obstruction: Secondary | ICD-10-CM | POA: Diagnosis not present

## 2023-02-13 DIAGNOSIS — N281 Cyst of kidney, acquired: Secondary | ICD-10-CM | POA: Diagnosis not present

## 2023-02-13 DIAGNOSIS — Z8673 Personal history of transient ischemic attack (TIA), and cerebral infarction without residual deficits: Secondary | ICD-10-CM | POA: Insufficient documentation

## 2023-02-13 DIAGNOSIS — I2510 Atherosclerotic heart disease of native coronary artery without angina pectoris: Secondary | ICD-10-CM | POA: Diagnosis not present

## 2023-02-13 DIAGNOSIS — R918 Other nonspecific abnormal finding of lung field: Secondary | ICD-10-CM | POA: Diagnosis not present

## 2023-02-13 DIAGNOSIS — J439 Emphysema, unspecified: Secondary | ICD-10-CM | POA: Diagnosis not present

## 2023-02-13 LAB — COMPREHENSIVE METABOLIC PANEL
ALT: 22 U/L (ref 0–44)
AST: 20 U/L (ref 15–41)
Albumin: 3.6 g/dL (ref 3.5–5.0)
Alkaline Phosphatase: 75 U/L (ref 38–126)
Anion gap: 8 (ref 5–15)
BUN: 23 mg/dL (ref 8–23)
CO2: 24 mmol/L (ref 22–32)
Calcium: 8.8 mg/dL — ABNORMAL LOW (ref 8.9–10.3)
Chloride: 99 mmol/L (ref 98–111)
Creatinine, Ser: 1.15 mg/dL — ABNORMAL HIGH (ref 0.44–1.00)
GFR, Estimated: 48 mL/min — ABNORMAL LOW (ref >60)
Glucose, Bld: 161 mg/dL — ABNORMAL HIGH (ref 70–99)
Potassium: 3.8 mmol/L (ref 3.5–5.1)
Sodium: 131 mmol/L — ABNORMAL LOW (ref 135–145)
Total Bilirubin: 0.4 mg/dL (ref 0.3–1.2)
Total Protein: 7.9 g/dL (ref 6.5–8.1)

## 2023-02-13 LAB — CBC
HCT: 42.3 % (ref 36.0–46.0)
Hemoglobin: 13.6 g/dL (ref 12.0–15.0)
MCH: 28.6 pg (ref 26.0–34.0)
MCHC: 32.2 g/dL (ref 30.0–36.0)
MCV: 88.9 fL (ref 80.0–100.0)
Platelets: 211 10*3/uL (ref 150–400)
RBC: 4.76 MIL/uL (ref 3.87–5.11)
RDW: 14.3 % (ref 11.5–15.5)
WBC: 13 10*3/uL — ABNORMAL HIGH (ref 4.0–10.5)
nRBC: 0 % (ref 0.0–0.2)

## 2023-02-13 LAB — URINALYSIS, ROUTINE W REFLEX MICROSCOPIC
Bilirubin Urine: NEGATIVE
Glucose, UA: 100 mg/dL — AB
Hgb urine dipstick: NEGATIVE
Ketones, ur: NEGATIVE mg/dL
Nitrite: NEGATIVE
Protein, ur: NEGATIVE mg/dL
Specific Gravity, Urine: 1.02 (ref 1.005–1.030)
pH: 7 (ref 5.0–8.0)

## 2023-02-13 LAB — URINALYSIS, MICROSCOPIC (REFLEX): RBC / HPF: NONE SEEN RBC/hpf (ref 0–5)

## 2023-02-13 LAB — LIPASE, BLOOD: Lipase: 30 U/L (ref 11–51)

## 2023-02-13 LAB — BRAIN NATRIURETIC PEPTIDE: B Natriuretic Peptide: 212.3 pg/mL — ABNORMAL HIGH (ref 0.0–100.0)

## 2023-02-13 LAB — LACTIC ACID, PLASMA: Lactic Acid, Venous: 1.3 mmol/L (ref 0.5–1.9)

## 2023-02-13 MED ORDER — HYDROCODONE-ACETAMINOPHEN 5-325 MG PO TABS: 1.0000 | ORAL_TABLET | Freq: Four times a day (QID) | ORAL | 0 refills | Status: DC | PRN

## 2023-02-13 MED ORDER — TRIAMCINOLONE ACETONIDE 0.1 % EX CREA: 1.0000 | TOPICAL_CREAM | Freq: Two times a day (BID) | CUTANEOUS | 0 refills | Status: DC

## 2023-02-13 MED ORDER — IOHEXOL 300 MG/ML  SOLN
100.0000 mL | Freq: Once | INTRAMUSCULAR | Status: AC | PRN
  Administered 2023-02-13: 100 mL via INTRAVENOUS

## 2023-02-13 MED ORDER — MORPHINE SULFATE (PF) 4 MG/ML IV SOLN
4.0000 mg | Freq: Once | INTRAVENOUS | Status: AC
  Administered 2023-02-13: 4 mg via INTRAVENOUS
  Filled 2023-02-13: qty 1

## 2023-02-13 MED ORDER — PREDNISONE 20 MG PO TABS: 20.0000 mg | ORAL_TABLET | Freq: Every day | ORAL | 0 refills | Status: AC

## 2023-02-13 NOTE — ED Triage Notes (Signed)
Pt c/o RUQ pain x 2d; denies NVD, c/o general weakness/fatigue

## 2023-02-13 NOTE — Telephone Encounter (Signed)
   I will call and check on her ASAP.

## 2023-02-13 NOTE — Telephone Encounter (Signed)
I called the pt and she states she has a pain on right side of rib cage hurts to walk , cough and BP has been high . She is currently pulling in to the ER now . She wanted Dr Neva Seat to know

## 2023-02-13 NOTE — Discharge Instructions (Addendum)
It appears you have likely a psoriasis flare.  We have started you on steroids.  Use your home topical steroids, follow-up with primary care provider  You have stones in your gallbladder

## 2023-02-13 NOTE — ED Provider Notes (Signed)
Care assumed from previous provider, see note for full HPI.  Patient here with multiple complaints, right lower chest wall pain, abdominal pain, weakness, fatigue, rash to arms, legs and hands.  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  No urinary symptoms.  No overt exertional chest pain.  Pain not worse with food intake.  She has been able to eat and drink without difficulty.  Recently completed antibiotics for UTI still has symptoms however urinalysis negative for UTI here  Patient labs without significant abnormality Chest x-ray did not show any significant pathology  Previous provider thought rash likely due to eczema versus psoriasis.  Plan on follow-up on ultrasound, CT scan Physical Exam  BP (!) 148/97 (BP Location: Left Arm)   Pulse 88   Temp 97.6 F (36.4 C) (Oral)   Resp 20   Ht 5\' 7"  (1.702 m)   Wt 77.1 kg   BMI 26.63 kg/m   Physical Exam Vitals and nursing note reviewed.  Constitutional:      General: She is not in acute distress.    Appearance: She is well-developed. She is not ill-appearing.  HENT:     Head: Atraumatic.  Eyes:     Pupils: Pupils are equal, round, and reactive to light.  Cardiovascular:     Rate and Rhythm: Normal rate.     Heart sounds: Normal heart sounds.  Pulmonary:     Effort: Pulmonary effort is normal. No respiratory distress.     Breath sounds: Normal breath sounds.  Abdominal:     General: Bowel sounds are normal. There is no distension.     Palpations: Abdomen is soft.     Tenderness: There is no abdominal tenderness.     Comments: Soft, nontender.  Negative Murphy sign  Musculoskeletal:        General: Normal range of motion.     Cervical back: Normal range of motion.  Skin:    General: Skin is warm and dry.     Comments: Plaque like lesions to bilateral upper lower extremities.  No rash to mucous membranes.  Does have peeling to bilateral  palms  Neurological:     General: No focal deficit present.     Mental Status: She is alert.  Psychiatric:        Mood and Affect: Mood normal.    Procedures  Procedures Labs Reviewed  COMPREHENSIVE METABOLIC PANEL - Abnormal; Notable for the following components:      Result Value   Sodium 131 (*)    Glucose, Bld 161 (*)    Creatinine, Ser 1.15 (*)    Calcium 8.8 (*)    GFR, Estimated 48 (*)    All other components within normal limits  CBC - Abnormal; Notable for the following components:   WBC 13.0 (*)    All other components within normal limits  URINALYSIS, ROUTINE W REFLEX MICROSCOPIC - Abnormal; Notable for the following components:   Glucose, UA 100 (*)    Leukocytes,Ua TRACE (*)    All other components within normal limits  URINALYSIS, MICROSCOPIC (REFLEX) - Abnormal; Notable for the following components:   Bacteria, UA RARE (*)    All other components within normal limits  BRAIN NATRIURETIC PEPTIDE - Abnormal; Notable for the following components:   B Natriuretic Peptide 212.3 (*)    All other components within normal limits  LIPASE, BLOOD  LACTIC ACID, PLASMA  LACTIC ACID,  PLASMA   US Abdomen Limited RUQ (LIVER/GB)  Result Date: 02/13/2023 CLINICAL DATA:  Right upper quadrant pain EXAM: ULTRASOUND ABDOMEN LIMITED RIGHT UPPER QUADRANT COMPARISON:  CT from earlier in the same day. FINDINGS: Gallbladder: Gallstone is noted which was not well appreciated on prior CT examination. No wall thickening or pericholecystic fluid is noted. Common bile duct: Diameter: 4 mm mild intrahepatic ductal dilatation is seen similar to that noted on the prior CT examination although no obstructing lesion is seen. Liver: No focal lesion identified. Within normal limits in parenchymal echogenicity. Portal vein is patent on color Doppler imaging with normal direction of blood flow towards the liver. Other: None. IMPRESSION: Cholelithiasis without complicating factors. Mild intrahepatic ductal  dilatation which may be within normal limits for the patient's age as no obstructing lesion is seen on recent CT examination. Correlate with laboratory values. Electronically Signed   By: Alcide Clever M.D.   On: 02/13/2023 19:38   CT CHEST ABDOMEN PELVIS W CONTRAST  Result Date: 02/13/2023 CLINICAL DATA:  Right upper quadrant pain and generalized weakness for 2 days, initial encounter EXAM: CT CHEST, ABDOMEN, AND PELVIS WITH CONTRAST TECHNIQUE: Multidetector CT imaging of the chest, abdomen and pelvis was performed following the standard protocol during bolus administration of intravenous contrast. RADIATION DOSE REDUCTION: This exam was performed according to the departmental dose-optimization program which includes automated exposure control, adjustment of the mA and/or kV according to patient size and/or use of iterative reconstruction technique. CONTRAST:  OMNIPAQUE IOHEXOL 300 MG/ML  SOLN COMPARISON:  Ultrasound from the same day, CT from 10/05/2022 FINDINGS: CT CHEST FINDINGS Cardiovascular: Atherosclerotic calcifications of the thoracic aorta are noted. No aneurysmal dilatation or dissection is seen. No cardiac enlargement is noted. Coronary calcifications are seen. No pulmonary emboli are noted although timing was not performed for embolus evaluation. Mediastinum/Nodes: Thoracic inlet is within normal limits. No hilar or mediastinal adenopathy is noted. The esophagus as visualized is within normal limits. Lungs/Pleura: Fibrotic changes are again identified within both lungs predominately in the subpleural location stable in appearance from the prior exam. Emphysematous changes are again seen. Stable 6 mm pulmonary nodule is noted in the right upper lobe best seen on image number 80 of series 3. second 3-4 mm nodule is noted within the right middle lobe best seen on image number 101 of series 3. No other sizable parenchymal nodules are seen. No effusion or pneumothorax is noted. Musculoskeletal: No  acute rib abnormality is noted. Degenerative changes of the thoracic spine are seen. Calcified loose bodies are noted in the right shoulder joint. CT ABDOMEN PELVIS FINDINGS Hepatobiliary: Gallbladder is within normal limits. Liver shows no focal mass lesion. Minimal central biliary prominence is noted although no obstructing lesion or stone is seen. Pancreas: Unremarkable. No pancreatic ductal dilatation or surrounding inflammatory changes. Spleen: Normal in size without focal abnormality. Adrenals/Urinary Tract: Adrenal glands are within normal limits. Kidneys are well visualized bilaterally within normal enhancement pattern. No renal calculi or obstructive changes are noted. Multiple cysts are seen bilaterally. The largest of these measures 2.6 cm in the lower pole of the left kidney. They all appear simple in nature and no further follow-up is recommended. No obstructive changes are seen. The bladder is well distended. Stomach/Bowel: Scattered fecal material is noted throughout the colon. No obstructive or inflammatory changes of the colon are seen. The appendix is not well visualized. No inflammatory changes to suggest appendicitis are noted. Small bowel and stomach are within normal limits. Vascular/Lymphatic: Aortic atherosclerosis.  Mild aneurysmal dilatation of the infrarenal aorta to 3.2 cm is noted. No dissection is seen. No enlarged abdominal or pelvic lymph nodes. Reproductive: Uterus and bilateral adnexa are unremarkable. Other: No abdominal wall hernia or abnormality. No abdominopelvic ascites. Musculoskeletal: Degenerative changes of lumbar spine are noted. No acute bony abnormality is seen. IMPRESSION: CT of the chest: Stable pulmonary nodules when compared with the prior exam as well as prior exams dating back to 2021. Given their long-term stability no further follow-up is recommended. Chronic fibrotic and emphysematous changes. CT of the abdomen and pelvis: Abdominal aortic aneurysm measuring 3.2  cm infrarenal. Recommend follow-up every 3 years. Reference: J Am Coll Radiol 2013;10:789-794. No acute abnormality is noted in the abdomen and pelvis. Aortic Atherosclerosis (ICD10-I70.0) and Emphysema (ICD10-J43.9). Electronically Signed   By: Alcide Clever M.D.   On: 02/13/2023 19:37   DG Chest 2 View  Result Date: 02/13/2023 CLINICAL DATA:  Shortness of breath. Right upper quadrant pain for 2 days. EXAM: CHEST - 2 VIEW COMPARISON:  Chest radiographs 11/27/2022 and 10/05/2022; CT chest 10/05/2022 FINDINGS: Cardiac silhouette is again mildly enlarged. Dense atherosclerotic calcifications are again seen within aortic arch. Redemonstration of bilateral high-grade diffuse interstitial thickening and coarse reticular opacities consistent with known chronic lung disease. Mildly decreased lung volumes compared to most recent 11/27/2022 radiograph. Mildly increased densities overlying the bilateral lower lungs is favored to represent bronchovascular crowding and crowding of the scarring rather than an acute airspace opacity. No pleural effusion pneumothorax. Moderate multilevel degenerative disc changes of the thoracic spine. IMPRESSION: 1. Redemonstration of bilateral high-grade diffuse interstitial thickening and coarse reticular opacities consistent with known chronic lung disease. 2. Mildly decreased lung volumes. No definite acute airspace opacity. Electronically Signed   By: Neita Garnet M.D.   On: 02/13/2023 18:19    ED Course / MDM   Clinical Course as of 02/13/23 2030  Tue Feb 13, 2023  1722 Desquamating rash on hands -- started a week ago. Papules with scabs on arms and elbows. A few on back. Started a week ago. 2 days ago, sudden onset RUQ stabbing pain. Overall constant. Not associated with eating. LBM yesterday, normal. No chest pain. Does have shob, slightly worse than baseline. Dry cough. Treated for UTI a month ago? Bactrim? Mostly improved, having burning with urination, still, as well as  hematuria. Normal HR, rhythm. Systolic murmur. Fluid in right lower lobe? Soft and no guarding of abdomen, some RUQ ttp.  [CP]  1857 Possible dyshidrotic eczema? Low suspicion SJS, TEN. FU on imaging, pain to RUQ [BH]    Clinical Course User Index [BH] Lukka Black A, PA-C [CP] Prosperi, Christian H, PA-C    Care assumed from previous provider, see note for full HPI.  Patient here with multiple complaints, right lower chest wall pain, abdominal pain, weakness, fatigue, rash to arms, legs and hands.  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  No urinary symptoms.  No overt exertional chest pain.  Pain not worse with food intake.  She has been able to eat and drink without difficulty.  Recently completed antibiotics for UTI still has symptoms however urinalysis negative for UTI here  Patient labs without significant abnormality Chest x-ray did not show any significant pathology  Previous provider thought rash likely due to eczema versus psoriasis.  Plan on follow-up on ultrasound, CT scan  Patient reassessed.  No pain.  She is requesting p.o.  intake.  We discussed her labs and imaging.  She does have some gallstones however no evidence of gallbladder wall thickening.  Ultrasound does state mild hepatic duct dilation could be normal for age, correlate with LFTs no obstructing lesions on CT scan.  LFTs are normal have low suspicion for choledocholithiasis, cholecystitis.  Her pain has been controlled here.  Question cholelithiasis as cause of her symptoms however given she is pain controlled, afebrile will have her keep close watch of her symptoms, pain management at home, return if she develops fever, uncontrolled pain.  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  Low suspicion for TN, SJS.  Will have her complete short course of steroids given large area of lesions and  have her follow-up outpatient.  I encouraged her to return for new or worsening symptoms.  Follow-up outpatient.  The patient has been appropriately medically screened and/or stabilized in the ED. I have low suspicion for any other emergent medical condition which would require further screening, evaluation or treatment in the ED or require inpatient management.  Patient is hemodynamically stable and in no acute distress.  Patient able to ambulate in department prior to ED.  Evaluation does not show acute pathology that would require ongoing or additional emergent interventions while in the emergency department or further inpatient treatment.  I have discussed the diagnosis with the patient and answered all questions.  Pain is been managed while in the emergency department and patient has no further complaints prior to discharge.  Patient is comfortable with plan discussed in room and is stable for discharge at this time.  I have discussed strict return precautions for returning to the emergency department.  Patient was encouraged to follow-up with PCP/specialist refer to at discharge.    Medical Decision Making Amount and/or Complexity of Data Reviewed External Data Reviewed: labs, radiology, ECG and notes. Labs: ordered. Decision-making details documented in ED Course. Radiology: ordered and independent interpretation performed. Decision-making details documented in ED Course. ECG/medicine tests: ordered and independent interpretation performed. Decision-making details documented in ED Course.  Risk OTC drugs. Prescription drug management. Parenteral controlled substances. Decision regarding hospitalization. Diagnosis or treatment significantly limited by social determinants of health.          Alazia Crocket A, PA-C 02/13/23 2030    Vanetta Mulders, MD 02/13/23 [id number]

## 2023-02-13 NOTE — Telephone Encounter (Signed)
Caller name: [name]  On DPR?: Yes  Call back number: Health Team Nurse   Provider they see: Shade Flood, MD  Reason for call:  Pt called stating with every move she wss out of breathe so I sent her to triage.

## 2023-02-13 NOTE — ED Provider Notes (Signed)
[facility] Provider Note   CSN: [id number] Arrival date & time: 02/13/23  1436     History  Chief Complaint  Patient presents with   Abdominal Pain    [name] is a 77 y.o. female past medical history significant for hypertension, hyperlipidemia, CAD, peripheral vascular disease, previous CVA, previous meningioma, stable abdominal aortic aneurysm who presents with concern for right upper quadrant pain for 2 days, generalized weakness, fatigue, as well as a rash on her hands and arms.  Patient with history of eczema versus psoriasis but reports she has not had any recent flares.   Abdominal Pain      Home Medications Prior to Admission medications   Medication Sig Start Date End Date Taking? Authorizing Provider  albuterol (VENTOLIN HFA) 108 (90 Base) MCG/ACT inhaler Inhale 1-2 puffs into the lungs every 6 (six) hours as needed for wheezing or shortness of breath. Patient not taking: Reported on 02/07/2023 06/09/22   Shade Flood, MD  amLODipine (NORVASC) 5 MG tablet Take 1 tablet (5 mg total) by mouth daily. Patient not taking: Reported on 02/07/2023 02/07/23   Rollene Rotunda, MD  aspirin EC 81 MG tablet Take 81 mg by mouth daily. Swallow whole.    [provider]  clonazePAM (KLONOPIN) 1 MG tablet PRN Patient not taking: Reported on 02/07/2023    [provider]  DULoxetine (CYMBALTA) 30 MG capsule Take 1 capsule (30 mg total) by mouth 2 (two) times daily. Patient not taking: Reported on 02/07/2023 11/03/21   Lauraine Rinne., MD  famotidine (PEPCID) 20 MG tablet One at bedtime Patient not taking: Reported on 02/07/2023 02/14/16   Nyoka Cowden, MD  famotidine (PEPCID) 20 MG tablet 1 tablet at bedtime as needed Orally Once a day for 30 day(s) Patient not taking: Reported on 02/07/2023    [provider]  lamoTRIgine (LAMICTAL) 25 MG tablet Take 3 tablets (75 mg total) by mouth daily. Patient not  taking: Reported on 02/07/2023 11/03/21   Cottle, Steva Ready., MD  linaclotide [facility]) 145 MCG CAPS capsule Take 1 capsule (145 mcg total) by mouth 2 (two) times daily. Patient not taking: Reported on 02/07/2023 07/01/21   Benancio Deeds, MD  Magnesium Hydroxide (DULCOLAX PO) Take by mouth as needed. Patient not taking: Reported on 02/07/2023    [provider]  Melatonin 10 MG TABS Take 20 mg by mouth at bedtime. Patient not taking: Reported on 02/07/2023    [provider]  OLANZapine zydis (ZYPREXA) 5 MG disintegrating tablet Take 1 tablet (5 mg total) by mouth at bedtime. 02/09/23   Cottle, Steva Ready., MD  omeprazole (PRILOSEC) 20 MG capsule Take 1 capsule (20 mg total) by mouth daily. Patient not taking: Reported on 02/07/2023 10/05/22   Gwyneth Sprout, MD  rOPINIRole (REQUIP) 1 MG tablet 1 tablet 1 to 3 hours before bedtime Orally Once a day for 30 day(s) Patient not taking: Reported on 02/07/2023    [provider]  rOPINIRole (REQUIP) 2 MG tablet TAKE 1 TABLET(2 MG) BY MOUTH AT BEDTIME Patient not taking: Reported on 02/07/2023 10/17/22   Lauraine Rinne., MD  Senna EXTR Take by mouth as needed. Patient not taking: Reported on 02/07/2023    [provider]  triamcinolone cream (KENALOG) 0.1 % Apply 1 Application topically 2 (two) times daily. Patient not taking: Reported on 02/07/2023 06/22/22   Shade Flood, MD  Allergies    Patient has no known allergies.    Review of Systems   Review of Systems  Gastrointestinal:  Positive for abdominal pain.  All other systems reviewed and are negative.   Physical Exam Updated Vital Signs BP (!) 148/97 (BP Location: Left Arm)   Pulse 88   Temp 97.6 F (36.4 C) (Oral)   Resp 20   Ht 5\' 7"  (1.702 m)   Wt 77.1 kg   BMI 26.63 kg/m  Physical Exam Vitals and nursing note reviewed.  Constitutional:      General: She is not in acute distress.    Appearance: Normal appearance.  HENT:      Head: Normocephalic and atraumatic.  Eyes:     General:        Right eye: No discharge.        Left eye: No discharge.  Cardiovascular:     Rate and Rhythm: Normal rate and regular rhythm.     Heart sounds: No murmur heard.    No friction rub. No gallop.     Comments: Patient has a systolic murmur loudest at 2nd ics sternum Pulmonary:     Effort: Pulmonary effort is normal.     Breath sounds: Normal breath sounds.     Comments: No wheezing, rhonchi, stridor, rales, questionable decreased breath sounds versus focal consolidation in right lower lobe Abdominal:     General: Bowel sounds are normal.     Palpations: Abdomen is soft.     Comments: Focal right upper quadrant tenderness on exam, no rebound, rigidity, guarding throughout.  Skin:    General: Skin is warm and dry.     Capillary Refill: Capillary refill takes less than 2 seconds.     Comments: Patient with red, desquamating rash on the hands, possibly consistent with desquamating eczema, she has some guttate papules on bilateral upper extremities, and scattered throughout her abdomen, chest.  No targetoid lesions, negative Nikolsky sign throughout.  Neurological:     Mental Status: She is alert and oriented to person, place, and time.  Psychiatric:        Mood and Affect: Mood normal.        Behavior: Behavior normal.     ED Results / Procedures / Treatments   Labs (all labs ordered are listed, but only abnormal results are displayed) Labs Reviewed  COMPREHENSIVE METABOLIC PANEL - Abnormal; Notable for the following components:      Result Value   Sodium 131 (*)    Glucose, Bld 161 (*)    Creatinine, Ser 1.15 (*)    Calcium 8.8 (*)    GFR, Estimated 48 (*)    All other components within normal limits  CBC - Abnormal; Notable for the following components:   WBC 13.0 (*)    All other components within normal limits  URINALYSIS, ROUTINE W REFLEX MICROSCOPIC - Abnormal; Notable for the following components:   Glucose,  UA 100 (*)    Leukocytes,Ua TRACE (*)    All other components within normal limits  URINALYSIS, MICROSCOPIC (REFLEX) - Abnormal; Notable for the following components:   Bacteria, UA RARE (*)    All other components within normal limits  BRAIN NATRIURETIC PEPTIDE - Abnormal; Notable for the following components:   B Natriuretic Peptide 212.3 (*)    All other components within normal limits  LIPASE, BLOOD  LACTIC ACID, PLASMA  LACTIC ACID, PLASMA    EKG EKG Interpretation  Date/Time:  Tuesday February 13 2023 15:02:51 EDT  Ventricular Rate:  86 PR Interval:  150 QRS Duration: 88 QT Interval:  330 QTC Calculation: 395 R Axis:   -14 Text Interpretation: Sinus rhythm Biatrial enlargement Anterior infarct, old Abnormal T, consider ischemia, lateral leads Confirmed by Vanetta Mulders [phone]) on 02/13/2023 3:17:38 PM  Radiology DG Chest 2 View  Result Date: 02/13/2023 CLINICAL DATA:  Shortness of breath. Right upper quadrant pain for 2 days. EXAM: CHEST - 2 VIEW COMPARISON:  Chest radiographs 11/27/2022 and 10/05/2022; CT chest 10/05/2022 FINDINGS: Cardiac silhouette is again mildly enlarged. Dense atherosclerotic calcifications are again seen within aortic arch. Redemonstration of bilateral high-grade diffuse interstitial thickening and coarse reticular opacities consistent with known chronic lung disease. Mildly decreased lung volumes compared to most recent 11/27/2022 radiograph. Mildly increased densities overlying the bilateral lower lungs is favored to represent bronchovascular crowding and crowding of the scarring rather than an acute airspace opacity. No pleural effusion pneumothorax. Moderate multilevel degenerative disc changes of the thoracic spine. IMPRESSION: 1. Redemonstration of bilateral high-grade diffuse interstitial thickening and coarse reticular opacities consistent with known chronic lung disease. 2. Mildly decreased lung volumes. No definite acute airspace opacity. Electronically  Signed   By: Neita Garnet M.D.   On: 02/13/2023 18:19    Procedures Procedures    Medications Ordered in ED Medications  morphine (PF) 4 MG/ML injection 4 mg (4 mg Intravenous Given 02/13/23 1758)  iohexol (OMNIPAQUE) 300 MG/ML solution 100 mL (100 mLs Intravenous Contrast Given 02/13/23 1859)    ED Course/ Medical Decision Making/ A&P Clinical Course as of 02/13/23 1902  Tue Feb 13, 2023  1722 Desquamating rash on hands -- started a week ago. Papules with scabs on arms and elbows. A few on back. Started a week ago. 2 days ago, sudden onset RUQ stabbing pain. Overall constant. Not associated with eating. LBM yesterday, normal. No chest pain. Does have shob, slightly worse than baseline. Dry cough. Treated for UTI a month ago? Bactrim? Mostly improved, having burning with urination, still, as well as hematuria. Normal HR, rhythm. Systolic murmur. Fluid in right lower lobe? Soft and no guarding of abdomen, some RUQ ttp.  [CP]  1857 Possible dyshidrotic eczema? Low suspicion SJS, TEN. FU on imaging, pain to RUQ [BH]    Clinical Course User Index [BH] Henderly, Britni A, PA-C [CP] Lukas Pelcher, Harrel Carina, PA-C                             Medical Decision Making Amount and/or Complexity of Data Reviewed Labs: ordered. Radiology: ordered.  Risk Prescription drug management.   This patient is a 77 y.o. female  who presents to the ED for concern of ruq pain, shortness of breath, generalized weakness.   Differential diagnoses prior to evaluation: The emergent differential diagnosis includes, but is not limited to, The causes of generalized abdominal pain include but are not limited to AAA, mesenteric ischemia, appendicitis, diverticulitis, DKA, gastritis, gastroenteritis, AMI, nephrolithiasis, pancreatitis, peritonitis, adrenal insufficiency,lead poisoning, iron toxicity, intestinal ischemia, constipation, UTI,SBO/LBO, splenic rupture, biliary disease, IBD, IBS, PUD, or hepatitis. CVA, spinal  cord injury, ACS, arrhythmia, syncope, orthostatic hypotension, sepsis, hypoglycemia, hypoxia, electrolyte disturbance, endocrine disorder, anemia, environmental exposure, polypharmacy . This is not an exhaustive differential.   Past Medical History / Co-morbidities / Social History: hypertension, hyperlipidemia, CAD, peripheral vascular disease, previous CVA, previous meningioma, stable abdominal aortic aneurysm  Additional history: Chart reviewed. Pertinent results include: Reviewed lab work, imaging from previous emergency department visits, notably with  normal left ventricular ejection fraction on last echo from 2021  Physical Exam: Physical exam performed. The pertinent findings include: Patient with red, desquamating rash on the hands, possibly consistent with desquamating eczema, she has some guttate papules on bilateral upper extremities, and scattered throughout her abdomen, chest.  No targetoid lesions, negative Nikolsky sign throughout.   Focal right upper quadrant tenderness on exam, no rebound, rigidity, guarding throughout.   Patient has a systolic murmur loudest at 2nd ics sternum -- known to her  Lab Tests/Imaging studies: I personally interpreted labs/imaging and the pertinent results include: CBC notable for mild leukocytosis, white blood cells 13.0.  CMP notable for mild hyponatremia, 131..   Creatinine at 1.15, improved from recent baseline.  No evidence of AKI.  UA unremarkable with no evidence of acute urinary tract infection.  Her BNP is mildly elevated at 212.3. I agree with the radiologist interpretation.  Cardiac monitoring: EKG obtained and interpreted by my attending physician which shows: Normal sinus rhythm with some abnormal T wave changes in the inferior leads   Medications: I ordered medication including morphine for pain.  I have reviewed the patients home medicines and have made adjustments as needed.  7:02 PM Care of TZIPPORA GILLEO transferred to [facility] and Dr. Deretha Emory at the end of my shift as the patient will require reassessment once labs/imaging have resulted. Patient presentation, ED course, and plan of care discussed with review of all pertinent labs and imaging. Please see his/her note for further details regarding further ED course and disposition. Plan at time of handoff is disposition pending results of right upper quadrant ultrasound and CT of chest, abdomen pelvis. This may be altered or completely changed at the discretion of the oncoming team pending results of further workup. Final Clinical Impression(s) / ED Diagnoses Final diagnoses:  None    Rx / DC Orders ED Discharge Orders     None         [place] 02/13/23 Lavell Anchors, MD 02/13/23 [phone]

## 2023-02-13 NOTE — Telephone Encounter (Signed)
Agree with triage assessment. 

## 2023-02-14 ENCOUNTER — Telehealth: Payer: Self-pay | Admitting: Family Medicine

## 2023-02-14 NOTE — Telephone Encounter (Signed)
ER visit noted, and per their documentation pain was controlled there.  Asked to return if uncontrolled pain and close watch of her symptoms.  If she is hurting more I would recommend follow-up ER visit, I do not feel comfortable sending in medication without seeing her especially given those recommendations from the ER.  If she is unable to be seen in person tomorrow we can start with a MyChart video visit, but if she is having increasing pain ER visit may be best.  Thanks.

## 2023-02-14 NOTE — Telephone Encounter (Signed)
I have spoken to the pt and advised that we can change her apt for tomorrow to a video and that I did .  I advised if pain gets worse to return to ER she expressed verbal understanding

## 2023-02-14 NOTE — Telephone Encounter (Signed)
Pt states she went to the ER last night dx w/ gall stones . She is hurting when she walks and talks .  She has an apt tomorrow to see DR Neva Seat . She is asking if she can have something for pain and wanting to know if she can switch her apt tomorrow to a my chart visit video instead due to she is hurting so bad while walking ?

## 2023-02-14 NOTE — Telephone Encounter (Signed)
Caller name: ALEXUS BUSBIN  On DPR?: Yes  Call back number: [phone] (mobile)  Provider they see: Shade Flood, MD  Reason for call:  Pt and Brother In Law called to say can she have something for pain.

## 2023-02-15 ENCOUNTER — Telehealth (INDEPENDENT_AMBULATORY_CARE_PROVIDER_SITE_OTHER): Payer: Medicare Other | Admitting: Family Medicine

## 2023-02-15 ENCOUNTER — Encounter: Payer: Self-pay | Admitting: Family Medicine

## 2023-02-15 DIAGNOSIS — R1011 Right upper quadrant pain: Secondary | ICD-10-CM | POA: Diagnosis not present

## 2023-02-15 DIAGNOSIS — K8020 Calculus of gallbladder without cholecystitis without obstruction: Secondary | ICD-10-CM

## 2023-02-15 DIAGNOSIS — I10 Essential (primary) hypertension: Secondary | ICD-10-CM | POA: Diagnosis not present

## 2023-02-15 DIAGNOSIS — I739 Peripheral vascular disease, unspecified: Secondary | ICD-10-CM

## 2023-02-15 NOTE — Progress Notes (Signed)
Virtual Visit via audio note Repeat link sent at 353 - not on video. 9 min chart review.  I connected with [name] on 02/15/23 at 4:24 PM I was able to hear and see patient - she was unable to hear or see me, unsuccessful after repeat attempt. Called on phone. Verified with 2 patient identifiers.  Patient location: home by self My location: office - [place].    I discussed the limitations, risks, security and privacy concerns of performing an evaluation and management service by telephone and the availability of in person appointments. I also discussed with the patient that there may be a patient responsible charge related to this service. The patient expressed understanding and agreed to proceed, consent obtained  Chief complaint:  Chief Complaint  Patient presents with   Abdominal Pain    Pt reports pain this morning was tolerable then has worsened over the day has tried to refrain from eating as that intensifies the pain, no issues with BM or urination     History of Present Illness: [name] is a 77 y.o. female  Abdominal pain Follow-up from ER visit 2 days ago.  Note reviewed.  Multiple concerns at that time but included right lower chest wall pain, abdominal pain, weakness, fatigue.  Lipase was normal.  Lactate normal.Leukocytosis of 13 but normal hemoglobin.  Hyponatremia with sodium 131, glucose 161, slight elevated creatinine 1.15 but improved compared to 2 months prior at 1.26.  Urinalysis with trace LE, 100 glucose, but otherwise negative.  Chest x-ray with signs of chronic lung disease, no definite acute airspace opacity.  CT chest abdomen and pelvis indicated stable pulmonary nodules when compared to prior exam.  Chronic fibrotic and emphysematous changes.  Abdominal aortic aneurysm 3.2 cm plan repeat every 3 years.  Gallbladder within normal limits, minimal central biliary prominence though no obstructing lesion or stone.  Unremarkable pancreas.  No renal  calculi.  Multiple renal cysts bilaterally, largest 2.6 cm lower pole of left kidney, all.  Simple in nature.  Bladder was well-distended.  No obstructive or inflammatory changes of the colon.  Appendix not well-visualized but no inflammatory changes to suggest appendicitis.Abdominal ultrasound showed cholelithiasis without complicating factors.  No wall thickening or pericholecystic fluid noted.  Mild intrahepatic ductal dilatation but no obstructing lesion seen.   Pain about the same as when in ER. Pain is better if lying on her back and not moving, but pain R upper abdomen - radiates to small of her back.  Today is a little better - able to move a little bit more - able to walk without pain this afternoon - short distances. Will still have intermittent attacks on pain. Has not eaten anything today - just ordered some food, but ate a hamburger yesterday. Drinking ginger ale.  No vomiting, some chronic nausea, no changes.  Normal BM earlier today. No blood. No recent straining or constipation.  Pain had started about a day prior to ER visit.  No fever.  Elevated blood pressure at times - better on second reading - 157/98. Has been seen recently by cardiology - 02/07/23 - BP 185/103. Restarted amlodipine 5mg  qd.     Patient Active Problem List   Diagnosis Date Noted   Centrilobular emphysema (HCC) 10/19/2020   Chronic respiratory failure with hypoxia (HCC) 12/26/2019   Shortness of breath 12/26/2019   Bilateral pulmonary infiltrates on CXR    Complicated UTI (urinary tract infection) 11/11/2019   AKI (acute kidney injury) (HCC) 11/11/2019  Hyponatremia 11/11/2019   Elevated LFTs 11/11/2019   Sepsis (HCC) 11/11/2019   Wound infection 11/07/2018   Postoperative pain    Sleep disturbance    Tobacco abuse    Acute blood loss anemia    Hypoalbuminemia due to protein-calorie malnutrition (HCC)    Debility 10/24/2018   Pre-operative cardiovascular examination 09/26/2018   HLD (hyperlipidemia)  09/26/2018   GAD (generalized anxiety disorder) 08/07/2018   Major depressive disorder, single episode 08/07/2018   Appendiceal abscess 07/08/2018   PVD (peripheral vascular disease) (HCC) 03/15/2018   Atherosclerosis of native artery of left lower extremity with intermittent claudication (HCC) 03/15/2018   Chronic left-sided low back pain with left-sided sciatica 12/25/2017   Facial droop    History of CVA (cerebrovascular accident) 10/23/2017   Critical lower limb ischemia (HCC) 11/08/2016   Smoker 11/08/2016   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 01/16/2014   Mild cognitive impairment 01/16/2014   Meningioma (HCC) 10/21/2013   Chest pain 10/20/2013   Medicare annual wellness visit, subsequent 06/16/2013   Dizziness and giddiness 06/16/2013   RLS (restless legs syndrome) 04/29/2012   Abdominal pain 12/27/2010   DEGENERATIVE JOINT DISEASE 09/23/2010   CAROTID ARTERY DISEASE 08/15/2010   CHEST PAIN 08/15/2010   HEADACHE 12/02/2009   BACK PAIN 11/22/2009   CONSTIPATION, CHRONIC 01/29/2008   NAUSEA 01/28/2008   DEPRESSION 03/08/2007   HTN (hypertension) 03/08/2007   Past Medical History:  Diagnosis Date   AAA (abdominal aortic aneurysm) (HCC)    3.1 cm 07/08/18, 3 year follow-up recommended; possible 3 cm AAA by aortogram 09/13/18   Anemia    PMH   Appendicitis with abscess    07/08/18, s/p perc drain; resolved 07/30/18 by CT   Arthritis    Bipolar disorder (HCC)    Cerebrovascular disease    intra and extracranial vascular dx per MRI 4/11, neurology rec strict CVRF control   Colonic inertia    Constipation    chronic;severe   Depression    Duodenitis    Gastritis    GERD (gastroesophageal reflux disease)    Hypertension    Hypothyroid 01/16/2014   Meningioma (HCC) 10/21/2013   PAD (peripheral artery disease) (HCC)    Psoriasis    sees derm   Wears dentures    Wears glasses    Past Surgical History:   Procedure Laterality Date   ABDOMINAL AORTOGRAM W/LOWER EXTREMITY N/A 09/13/2018   Procedure: ABDOMINAL AORTOGRAM W/LOWER EXTREMITY;  Surgeon: Sherren Kerns, MD;  Location: [facility];  Service: Cardiovascular;  Laterality: N/A;   arthroscopy  04/2010   Right knee   CATARACT EXTRACTION W/ INTRAOCULAR LENS  IMPLANT, BILATERAL     COLONOSCOPY     FEMORAL-POPLITEAL BYPASS GRAFT  10/22/2018   FEMORAL-POPLITEAL BYPASS GRAFT Left 10/22/2018   Procedure: LEFT FEMORAL TO BELOW THE KNEE POPLITEAL ARTERY BYPASS GRAFT;  Surgeon: Sherren Kerns, MD;  Location: [facility] OR;  Service: Vascular;  Laterality: Left;   I & D EXTREMITY Left 11/08/2018   Procedure: IRRIGATION AND DEBRIDEMENT EXTREMITY Left Leg;  Surgeon: Sherren Kerns, MD;  Location: [facility] OR;  Service: Vascular;  Laterality: Left;   IR RADIOLOGIST EVAL & MGMT  07/30/2018   MULTIPLE TOOTH EXTRACTIONS     TUBAL LIGATION     No Known Allergies Prior to Admission medications   Medication Sig Start Date End Date Taking? Authorizing Provider  amLODipine (NORVASC) 5 MG tablet Take 1 tablet (5 mg total)  by mouth daily. 02/07/23  Yes Rollene Rotunda, MD  aspirin EC 81 MG tablet Take 81 mg by mouth daily. Swallow whole.   Yes [provider]  clonazePAM (KLONOPIN) 1 MG tablet Take 0.5 tablets by mouth daily. PRN   Yes [provider]  famotidine (PEPCID) 20 MG tablet One at bedtime 02/14/16  Yes Nyoka Cowden, MD  HYDROcodone-acetaminophen (NORCO/VICODIN) 5-325 MG tablet Take 1 tablet by mouth every 6 (six) hours as needed. 02/13/23  Yes Henderly, Britni A, PA-C  OLANZapine zydis (ZYPREXA) 5 MG disintegrating tablet Take 1 tablet (5 mg total) by mouth at bedtime. 02/09/23  Yes Cottle, Steva Ready., MD  predniSONE (DELTASONE) 20 MG tablet Take 1 tablet (20 mg total) by mouth daily for 5 days. 02/13/23 02/18/23 Yes Henderly, Britni A, PA-C  triamcinolone cream (KENALOG) 0.1 % Apply 1 Application topically 2 (two) times daily. 02/13/23  Yes  Henderly, Britni A, PA-C  albuterol (VENTOLIN HFA) 108 (90 Base) MCG/ACT inhaler Inhale 1-2 puffs into the lungs every 6 (six) hours as needed for wheezing or shortness of breath. Patient not taking: Reported on 02/07/2023 06/09/22   Shade Flood, MD  DULoxetine (CYMBALTA) 30 MG capsule Take 1 capsule (30 mg total) by mouth 2 (two) times daily. Patient not taking: Reported on 02/07/2023 11/03/21   Lauraine Rinne., MD  famotidine (PEPCID) 20 MG tablet 1 tablet at bedtime as needed Orally Once a day for 30 day(s) Patient not taking: Reported on 02/07/2023    [provider]  lamoTRIgine (LAMICTAL) 25 MG tablet Take 3 tablets (75 mg total) by mouth daily. Patient not taking: Reported on 02/07/2023 11/03/21   Cottle, Steva Ready., MD  linaclotide [facility]) 145 MCG CAPS capsule Take 1 capsule (145 mcg total) by mouth 2 (two) times daily. Patient not taking: Reported on 02/07/2023 07/01/21   Benancio Deeds, MD  Magnesium Hydroxide (DULCOLAX PO) Take by mouth as needed. Patient not taking: Reported on 02/07/2023    [provider]  Melatonin 10 MG TABS Take 20 mg by mouth at bedtime. Patient not taking: Reported on 02/07/2023    [provider]  omeprazole (PRILOSEC) 20 MG capsule Take 1 capsule (20 mg total) by mouth daily. Patient not taking: Reported on 02/07/2023 10/05/22   Gwyneth Sprout, MD  rOPINIRole (REQUIP) 1 MG tablet 1 tablet 1 to 3 hours before bedtime Orally Once a day for 30 day(s) Patient not taking: Reported on 02/07/2023    [provider]  rOPINIRole (REQUIP) 2 MG tablet TAKE 1 TABLET(2 MG) BY MOUTH AT BEDTIME Patient not taking: Reported on 02/07/2023 10/17/22   Lauraine Rinne., MD  Senna EXTR Take by mouth as needed. Patient not taking: Reported on 02/07/2023    [provider]   Social History   Socioeconomic History   Marital status: Widowed    Spouse name: Not on file   Number of children: 1   Years of education: Not on  file   Highest education level: Not on file  Occupational History   Occupation: retired  Tobacco Use   Smoking status: Every Day    Packs/day: 0.50    Years: 60.00    Additional pack years: 0.00    Total pack years: 30.00    Types: Cigarettes    Last attempt to quit: 11/10/2019    Years since quitting: 3.2   Smokeless tobacco: Never  Vaping Use   Vaping Use: Former  Substance and Sexual  Activity   Alcohol use: Yes    Alcohol/week: 0.0 standard drinks of alcohol    Comment: yes on occassion   Drug use: No   Sexual activity: Not Currently  Other Topics Concern   Not on file  Social History Narrative   Brother in law Mr [place] Neighbor   One son in [place].    Social Determinants of Health   Financial Resource Strain: Low Risk  (03/23/2021)   Overall Financial Resource Strain (CARDIA)    Difficulty of Paying Living Expenses: Not hard at all  Food Insecurity: No Food Insecurity (03/23/2021)   Hunger Vital Sign    Worried About Running Out of Food in the Last Year: Never true    Ran Out of Food in the Last Year: Never true  Transportation Needs: No Transportation Needs (03/23/2021)   PRAPARE - Administrator, Civil Service (Medical): No    Lack of Transportation (Non-Medical): No  Physical Activity: Inactive (03/23/2021)   Exercise Vital Sign    Days of Exercise per Week: 0 days    Minutes of Exercise per Session: 0 min  Stress: Not on file  Social Connections: Unknown (10/24/2018)   Social Connection and Isolation Panel [NHANES]    Frequency of Communication with Friends and Family: Patient declined    Frequency of Social Gatherings with Friends and Family: Patient declined    Attends Religious Services: Patient declined    Database administrator or Organizations: Patient declined    Attends Banker Meetings: Patient declined    Marital Status: Patient declined  Intimate Partner Violence: Unknown (10/24/2018)   Humiliation, Afraid, Rape, and Kick  questionnaire    Fear of Current or Ex-Partner: Patient declined    Emotionally Abused: Patient declined    Physically Abused: Patient declined    Sexually Abused: Patient declined    Observations/Objective: There were no vitals filed for this visit. Nontoxic initial appearance on video.  Coherent speech, few times of shortened sentences other times with complete sentences, appropriate responses.  All questions were answered with understanding of plan expressed.  Assessment and Plan: RUQ abdominal pain - Plan: Ambulatory referral to Gastroenterology  Calculus of gallbladder without cholecystitis without obstruction - Plan: Ambulatory referral to Gastroenterology  PVD (peripheral vascular disease) (HCC) - Plan: Ambulatory referral to Gastroenterology  Primary hypertension  Right upper quadrant abdominal pain, ER workup as above without apparent acute findings but did have cholelithiasis, mild intrahepatic ductal dilatation.  Persistent pain until today with episodes of worsening pain, but reports some improvement as today is going on.  Able to eat yesterday, drinking fluids today and about to eat again tonight.  With her history of PVD, I am concerned about possible mesenteric ischemia/vascular cause of abdominal pain but previous CT was overall reassuring.  Also differential including acute cholecystitis or obstruction with cholelithiasis although that was also not seen on recent imaging.  ER precautions given if any worsening pain.  Referral placed to gastroenterology.  Tylenol okay if mild discomfort at this time with ER precautions if that is not effective or worsening.  Notes elevated blood pressure at home, but recent cardiology visit with restart of amlodipine.  An office eval requested next week with ER precautions in the interim.  She expressed understanding of this plan and all questions were answered.  19 minutes of phone call +9 minutes initial chart review, Follow Up  Instructions: 1 week in person.   I discussed the assessment and treatment plan with  the patient. The patient was provided an opportunity to ask questions and all were answered. The patient agreed with the plan and demonstrated an understanding of the instructions.   The patient was advised to call back or seek an in-person evaluation if the symptoms worsen or if the condition fails to improve as anticipated.   Shade Flood, MD

## 2023-02-15 NOTE — Patient Instructions (Addendum)
I am glad to hear pain has slightly improved today. I am concerned about the continued pain and episodes of pain. I will send a message to your gastroenterologist and see if I am able to get you into see them sooner, but I do recommend being seen in the emergency room tonight or tomorrow if any increasing pain or pain that is not resolved with Tylenol.  As we discussed you did have a gallstone which could get worse or cause gallbladder issue, and with your circulation issues I am concerned about possible blood vessel issue or circulation issue causing abdominal pain.  Follow-up with me next week as well and we can recheck blood pressure but no med changes for now as cardiology just put you back on a medication.  Return to the clinic or go to the nearest emergency room if any of your symptoms worsen or new symptoms occur.

## 2023-02-16 ENCOUNTER — Telehealth: Payer: Self-pay | Admitting: Gastroenterology

## 2023-02-16 NOTE — Telephone Encounter (Signed)
I have spoken to the pt and advised her of the apt with GI and w/ a NP. Pt states she does want that apt because she does not like NP's. I explained to her that NP are very intelligent . She states she is calling them to change it . I tried to explain to her that her Dr was probably booked out is why they scheduled her with a NP. She was not happy. I advised her to call GI and ask for [facility] .

## 2023-02-16 NOTE — Telephone Encounter (Signed)
Inbound call from patient's primary care provider's office stating patient was recently at the ED for RUQ abdominal pain, Calculus of gallbladder without cholecystitis without obstruction, PVD (peripheral vascular disease) (HCC). States the patient was instructed to be seen as soon as possible. PCP requesting a call back to patient regarding advice on how to proceed. Please advise, thank you.

## 2023-02-16 NOTE — Telephone Encounter (Signed)
Gastroenterlogy office- [place] states that appt is June 13th 3pm needs to arrive by 2:45p. She'll be seeing a NP.

## 2023-02-21 ENCOUNTER — Ambulatory Visit (HOSPITAL_COMMUNITY)
Admission: RE | Admit: 2023-02-21 | Payer: Medicare Other | Source: Ambulatory Visit | Attending: Cardiology | Admitting: Cardiology

## 2023-02-22 ENCOUNTER — Telehealth: Payer: Self-pay | Admitting: Cardiology

## 2023-02-22 ENCOUNTER — Ambulatory Visit: Payer: Medicare Other | Admitting: Nurse Practitioner

## 2023-02-22 DIAGNOSIS — I10 Essential (primary) hypertension: Secondary | ICD-10-CM

## 2023-02-22 DIAGNOSIS — R110 Nausea: Secondary | ICD-10-CM | POA: Diagnosis not present

## 2023-02-22 NOTE — Telephone Encounter (Signed)
Pt c/o medication issue:  1. Name of Medication:   amLODipine (NORVASC) 5 MG tablet    2. How are you currently taking this medication (dosage and times per day)?  Take 1 tablet (5 mg total) by mouth daily.       3. Are you having a reaction (difficulty breathing--STAT)? No  4. What is your medication issue? Pt is requesting a callback after feeling like this medication is not helping her much. She's like to speak with a nurse regarding recommendations on other medications she could take her help her BP.

## 2023-02-22 NOTE — Telephone Encounter (Signed)
Left voicemail to return call to office.

## 2023-02-23 ENCOUNTER — Ambulatory Visit: Payer: Medicare Other | Admitting: Family Medicine

## 2023-02-23 ENCOUNTER — Telehealth: Payer: Self-pay | Admitting: Family Medicine

## 2023-02-23 MED ORDER — LOSARTAN POTASSIUM 25 MG PO TABS
25.0000 mg | ORAL_TABLET | Freq: Every day | ORAL | 3 refills | Status: DC
Start: 2023-02-23 — End: 2024-07-17

## 2023-02-23 NOTE — Telephone Encounter (Signed)
Patient aware of provider recommendations. She verbalized understanding. She repeated instructions back to me. Losartan sent to pharmacy on file per patient request.

## 2023-02-23 NOTE — Telephone Encounter (Signed)
Patient feels her amlodipine is not helping with her blood pressure. She decided to take 2 tablets of her amlodipine (10mg ). The next morning she woke up sweating, she felt weak, dizzy and her chest felt heavy. She was also having trouble breathing. She called 911 but refused to go to the ED. She feel like she needs a stronger blood pressure medication. Advised to continue to monitor blood pressure readings and keep a log. Will forward to provider for further advise.  6/12- 180/101 hr-96  6/13- 132/77 hr 77  6/14- 203/113 hr 86 170/112 hr 89 196/110 hr-84

## 2023-02-23 NOTE — Telephone Encounter (Signed)
Patient returned call

## 2023-02-23 NOTE — Telephone Encounter (Signed)
Call to patient and straight to VM.  LM to call office

## 2023-02-23 NOTE — Telephone Encounter (Signed)
Noted. Will send to Dr.Greene for [facility]

## 2023-02-23 NOTE — Telephone Encounter (Signed)
Pt was calling to follow up on getting a callback. Please advise

## 2023-02-23 NOTE — Telephone Encounter (Signed)
Patient Name First: Jenny Last: [name] Gender: Female DOB: April 09, 1946 Age: 77 Y 4 M 7 D Return Phone Number: [phone] (Primary) Address: City/ State/ Zip: [place]  [address] Client [facility] [place] [facility] - [place] Client Site [facility] - Day Provider [name]- MD Contact Type Call Who Is Calling Patient / Member / Family / Caregiver Call Type Triage / Clinical Relationship To Patient Self Return Phone Number [phone] (Primary) Chief Complaint BLOOD PRESSURE HIGH - Systolic (top number) 200 or greater Reason for Call Symptomatic / Request for Health Information Initial Comment Pt has high BP (203/113, 197/111). She is audilby SOB and fumbles zip code/ phone number, states that she is having trouble remembering. Office is transferring fo triage. Caller asks to speak to MD, not RN. Translation No Nurse Assessment Nurse: Vear Clock, RN, Elease Hashimoto Date/Time Lamount Cohen Time): 02/23/2023 9:58:00 AM Confirm and document reason for call. If symptomatic, describe symptoms. ---Pt has high BP (203/113, 197/111). She is very SOB and fumble She is by herself. She is not having chest pain. Does the patient have any new or worsening symptoms? ---Yes Will a triage be completed? ---Yes Related visit to physician within the last 2 weeks? ---No Does the PT have any chronic conditions? (i.e. diabetes, asthma, this includes High risk factors for pregnancy, etc.) ---Yes List chronic conditions. ---HTN, Is this a behavioral health or substance abuse call? ---No Guidelines Guideline Title Affirmed Question Affirmed Notes Nurse Date/Time (Eastern Time) Blood Pressure - High SEVERE difficulty breathing (e.g., struggling for each Emiliano Dyer 02/23/2023 9:59:20 AM PLEASE NOTE: All timestamps contained within this report are represented as [place] Standard Time. CONFIDENTIALTY NOTICE: This fax transmission is  intended only for the addressee. It contains information that is legally privileged, confidential or otherwise protected from use or disclosure. If you are not the intended recipient, you are strictly prohibited from reviewing, disclosing, copying using or disseminating any of this information or taking any action in reliance on or regarding this information. If you have received this fax in error, please notify us immediately by telephone so that we can arrange for its return to [place]. Phone: [phone], Toll-Free: [phone], Fax: [phone] Page: 2 of 2 Call Id: [id number] Guidelines Guideline Title Affirmed Question Affirmed Notes Nurse Date/Time (Easter  breath, speaks in single words) Disp. Time Lamount Cohen Time) Disposition Final User 02/23/2023 9:54:59 AM Send to Urgent Ellin Goodie 02/23/2023 10:00:03 AM Call EMS 911 Now Yes Vear Clock, RNElease Hashimoto 02/23/2023 10:08:14 AM 911 Outcome Documentation Vear Clock, RN, Elease Hashimoto Reason: LVM x 1 Final Disposition 02/23/2023 10:00:03 AM Call EMS 911 Now Yes Vear Clock, RN, Ancil Boozer Disagree/Comply Comply Caller Understands Yes PreDisposition Call Doctor Care Advice Given Per Guideline CALL EMS 911 NOW: * Immediate medical attention is needed. You need to hang up and call 911 (or an ambulance). * Triager Discretion: I'll call you back in a few minutes to be sure you were able to reach them. CARE ADVICE given per High Blood Pressure (Adult) guideline. Comments User: Aviva Kluver, RN Date/Time Lamount Cohen Time): 02/23/2023 10:05:22 AM She states EMS was at her house yesterday but she will call again today. I recommended she go to the ER if that is what the paramedic recommends. She verbalized understanding  Caller name: JORA LOGAN  On DPR?: Yes  Call back number: [phone] (mobile)  Provider they see: Shade Flood, MD  Reason for call:   Pt stated her BP was high was sent to Triage then told  to go to ED. She  refused. Did the same thing the next day. Demanded to speak to Tabori. Finally made an appt only to call back and cancel because Cardiologist had tentative appt.

## 2023-02-23 NOTE — Telephone Encounter (Signed)
Patient is returning call.  °

## 2023-02-23 NOTE — Telephone Encounter (Signed)
Informed patient provider we are waiting for provider response. Discussed ED precautions. She verbalized understanding.

## 2023-02-26 NOTE — Telephone Encounter (Signed)
Agree with recommendation for EMS evaluation.

## 2023-03-02 ENCOUNTER — Ambulatory Visit (INDEPENDENT_AMBULATORY_CARE_PROVIDER_SITE_OTHER): Payer: Medicare Other | Admitting: Psychiatry

## 2023-03-02 DIAGNOSIS — I739 Peripheral vascular disease, unspecified: Secondary | ICD-10-CM | POA: Diagnosis not present

## 2023-03-02 DIAGNOSIS — F5105 Insomnia due to other mental disorder: Secondary | ICD-10-CM | POA: Diagnosis not present

## 2023-03-02 DIAGNOSIS — G4721 Circadian rhythm sleep disorder, delayed sleep phase type: Secondary | ICD-10-CM

## 2023-03-02 DIAGNOSIS — F17219 Nicotine dependence, cigarettes, with unspecified nicotine-induced disorders: Secondary | ICD-10-CM

## 2023-03-02 DIAGNOSIS — G3184 Mild cognitive impairment, so stated: Secondary | ICD-10-CM

## 2023-03-02 DIAGNOSIS — Z9189 Other specified personal risk factors, not elsewhere classified: Secondary | ICD-10-CM

## 2023-03-02 DIAGNOSIS — J84112 Idiopathic pulmonary fibrosis: Secondary | ICD-10-CM | POA: Diagnosis not present

## 2023-03-02 DIAGNOSIS — F331 Major depressive disorder, recurrent, moderate: Secondary | ICD-10-CM | POA: Diagnosis not present

## 2023-03-02 DIAGNOSIS — F411 Generalized anxiety disorder: Secondary | ICD-10-CM

## 2023-03-02 DIAGNOSIS — Z62820 Parent-biological child conflict: Secondary | ICD-10-CM | POA: Diagnosis not present

## 2023-03-02 DIAGNOSIS — Z8673 Personal history of transient ischemic attack (TIA), and cerebral infarction without residual deficits: Secondary | ICD-10-CM

## 2023-03-02 NOTE — Progress Notes (Unsigned)
Psychotherapy Progress Note [facility] Marliss Czar, PhD LP  Patient ID: [name])    MRN: [id number] Therapy format: Individual psychotherapy Date: 03/02/2023      Start: 3:20p     Stop: 4:10p     Time Spent: 50 min Location: Telehealth visit -- I connected with this patient by an approved telecommunication method (audio only), with her informed consent, and verifying identity and patient privacy.  I was located at my office and patient at her home.  As needed, we discussed the limitations, risks, and security and privacy concerns associated with telehealth service, including the availability and conditions which currently govern in-person appointments and the possibility that 3rd-party payment may not be fully guaranteed and she may be responsible for charges.  After she indicated understanding, we proceeded with the session.  Also discussed treatment planning, as needed, including ongoing verbal agreement with the plan, the opportunity to ask and answer all questions, her demonstrated understanding of instructions, and her readiness to call the office should symptoms worsen or she feels she is in a crisis state and needs more immediate and tangible assistance.   Session narrative (presenting needs, interim history, self-report of stressors and symptoms, applications of prior therapy, status changes, and interventions made in session) Has engaged personal help, starting today, nice young woman there right now, [facility].  3-day/wk help to make bed, put away dishes.  Found her through an ad on [employer].  Has also engaged son [name] about her living situation, and may have an option to relocate to [facility], actually, in [place], a bout an hour away.  Got on the phone to [employer] and learned of nice options there.  Has a buyer for a 3-acre plot in [place], though [name] seems panicked she'll undersell.  Praised for getting up and working through her logistics for  getting help in the home and working out property and finances to cover, especially after a couple years of bereavement, motivational paralysis, and brushes with very serious cardiopulmonary illness.  Sounds like she is also collaborating a good bit on goodwill with [name], writing to him, using more encouraging language, and certainly biting her tongue rather than pop off.    Toward heading off bitter conflict with [name], renewed recommendation to have a timeout agreement, and introduced the idea that vicious words, when they do come from him, means a lot more about how he feels in his own situation than whatever they seem to say about her, and she can safely take them with a grain of salt if she's willing to not take the bait and forgo a fight, maybe even use harsh words as a clue to how he feels and ask if he's just trying to express feeling miserable by making her feel it with him.  Divulges that she has carried pain and guilt with her father a long time, but has, through talking with him (in spirit, believes he has visited her) forgiven and been forgiven.  Regrets not seeing earlier what was so valuable about his mind, when much of the family thought he was weird.  Sees him somewhat in her son [name].  Also sees her pGF, says he inherited the [employer], alluding to his past behavior before marrying [place], that also led to [place].  Redirected, she says, despite affirmations, that says she's been in a "horrible depression", which turns out to mean she's felt particularly scared lately working on her finances and living  situation and facing changes.  Reframed that as proof of improvement, actually, because it means she is facing tings that already scared her and s working the problem now, not avoiding through sleep, or medication, or moping.  Briefly planned ahead to next visit from [name], and reaffirmed the value of going ahead and letting him both that she appreciate how things are going  better and she wants to implement a therapy idea (timeout agreement).  Reminded that changing their habit when tense will benefit both of them and simultaneously give her a more effective response, take her off the hook to have to be defensive, prevent [name] from triggering, serve as an example to him, and automatically reward him for deescalating.  Agrees, will try.  Therapeutic modalities: Cognitive Behavioral Therapy, Solution-Oriented/Positive Psychology, Ego-Supportive, and Assertiveness/Communication  Mental Status/Observations:  Appearance:   Not assessed     Behavior:  Appropriate  Motor:  Not assessed  Speech/Language:   Clear and Coherent  Affect:  Not assessed  Mood:  anxious and better energy  Thought process:  normal  Thought content:    WNL  Sensory/Perceptual disturbances:    WNL  Orientation:  Fully oriented  Attention:  Good    Concentration:  Fair  Memory:  WNL  Insight:    Good  Judgment:   Good  Impulse Control:  Fair   Risk Assessment: Danger to Self: No Self-injurious Behavior: No Danger to Others: No Physical Aggression / Violence: No Duty to Warn: No Access to Firearms a concern: No  Assessment of progress:  progressing  Diagnosis:   ICD-10-CM   1. Major depressive disorder, recurrent episode, moderate (HCC)  F33.1     2. Generalized anxiety disorder  F41.1     3. 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 Insomnia due to mental condition  F51.05     5. 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  G31.84     6. History of CVA (cerebrovascular accident)  Z86.73     7. Nicotine dependence, cigarettes, with unspecified nicotine-induced disorders  F17.219     8. PVD (peripheral vascular disease) (HCC)  I73.9     9. Idiopathic pulmonary fibrosis (HCC) (per chart; pt self-describes COPD)  J84.112     10. At risk of disease - UTI, decubitus ulcer, others  Z91.89     11. Relationship problem between parent  and child  Z62.820      Plan:  Self-care issues -- Prioritize personal health care over all concerns about family relationships, respect or lack thereof, and financial concerns.  Recommend engage a home care assistant.  Self-affirm that higher care can still be both necessary and affordable, if she will look into the facts of costs and her home equity.  Do not talk self out of engaging treatment and relocation based on imagined financial issues.  Collaborate with medical social worker as available through PCP's office for more help engaging the system.  Begin looking into options and costs for independent living, to include both an elder community or care facility and for-hire personal companion service.  For relocation needs, consider a Investment banker, corporate to assess renting her house vs. selling.  Upon further evidence of failure to manage, or risk of more serious self-neglect, may call Adult Protective Services. Chronic health risks -- Abide by medical advice for chronic, serious conditions, maintain timely primary care, and seek help promptly for breathing issues or signs of cardiovascular or cerebrovascular  issues.  Including but not limited to monitoring BP, monitoring O2, report signs and sxs promptly, apply topical remedies faithfully, and try to reduce carbs. Family relations -- Seek an agreement with [name] to collaborate against mutual tendency to react and spite each other, and use timeout rather than backbiting, harshness, or verbal abuse.  Available to meet together with son if motivated. Cognitive impairment -- Monitor for deterioration, consequential delusions Other recommendations/advice -- As may be noted above.  Continue to utilize previously learned skills ad lib. Medication compliance -- Maintain medication as prescribed and work faithfully with relevant prescriber(s) if any changes are desired or seem indicated. Crisis service -- Aware of call list and work-in appts.  Call the clinic  on-call service, 988/hotline, 911, or present to [facility] or ER if any life-threatening psychiatric crisis. Followup -- Return in about 1 month (around 04/01/2023) for put on [place] list, will call.  Next scheduled visit with me Visit date not found.  Next scheduled in this office Visit date not found.  Robley Fries, PhD Marliss Czar, PhD LP Clinical Psychologist, [facility] [address] [place], [place] [address] [phone]

## 2023-03-03 ENCOUNTER — Emergency Department (HOSPITAL_COMMUNITY): Payer: Medicare Other

## 2023-03-03 ENCOUNTER — Inpatient Hospital Stay (HOSPITAL_COMMUNITY)
Admission: EM | Admit: 2023-03-03 | Discharge: 2023-03-07 | DRG: 065 | Disposition: A | Discharge status: Skilled Nursing Facility | Payer: Medicare Other | Attending: Internal Medicine | Admitting: Internal Medicine

## 2023-03-03 ENCOUNTER — Other Ambulatory Visit: Payer: Self-pay

## 2023-03-03 DIAGNOSIS — F39 Unspecified mood [affective] disorder: Secondary | ICD-10-CM | POA: Diagnosis not present

## 2023-03-03 DIAGNOSIS — G459 Transient cerebral ischemic attack, unspecified: Secondary | ICD-10-CM | POA: Diagnosis not present

## 2023-03-03 DIAGNOSIS — R7989 Other specified abnormal findings of blood chemistry: Secondary | ICD-10-CM | POA: Diagnosis present

## 2023-03-03 DIAGNOSIS — J84112 Idiopathic pulmonary fibrosis: Secondary | ICD-10-CM | POA: Diagnosis not present

## 2023-03-03 DIAGNOSIS — M4986 Spondylopathy in diseases classified elsewhere, lumbar region: Secondary | ICD-10-CM | POA: Diagnosis not present

## 2023-03-03 DIAGNOSIS — Z8673 Personal history of transient ischemic attack (TIA), and cerebral infarction without residual deficits: Secondary | ICD-10-CM

## 2023-03-03 DIAGNOSIS — R5381 Other malaise: Secondary | ICD-10-CM | POA: Diagnosis not present

## 2023-03-03 DIAGNOSIS — L409 Psoriasis, unspecified: Secondary | ICD-10-CM | POA: Diagnosis not present

## 2023-03-03 DIAGNOSIS — F17200 Nicotine dependence, unspecified, uncomplicated: Secondary | ICD-10-CM | POA: Diagnosis not present

## 2023-03-03 DIAGNOSIS — I69951 Hemiplegia and hemiparesis following unspecified cerebrovascular disease affecting right dominant side: Secondary | ICD-10-CM | POA: Diagnosis not present

## 2023-03-03 DIAGNOSIS — G8191 Hemiplegia, unspecified affecting right dominant side: Secondary | ICD-10-CM | POA: Diagnosis not present

## 2023-03-03 DIAGNOSIS — D329 Benign neoplasm of meninges, unspecified: Secondary | ICD-10-CM | POA: Diagnosis not present

## 2023-03-03 DIAGNOSIS — Z8249 Family history of ischemic heart disease and other diseases of the circulatory system: Secondary | ICD-10-CM

## 2023-03-03 DIAGNOSIS — Z79899 Other long term (current) drug therapy: Secondary | ICD-10-CM

## 2023-03-03 DIAGNOSIS — D320 Benign neoplasm of cerebral meninges: Secondary | ICD-10-CM | POA: Diagnosis present

## 2023-03-03 DIAGNOSIS — K219 Gastro-esophageal reflux disease without esophagitis: Secondary | ICD-10-CM | POA: Diagnosis present

## 2023-03-03 DIAGNOSIS — I672 Cerebral atherosclerosis: Secondary | ICD-10-CM | POA: Diagnosis not present

## 2023-03-03 DIAGNOSIS — Z7401 Bed confinement status: Secondary | ICD-10-CM | POA: Diagnosis not present

## 2023-03-03 DIAGNOSIS — N183 Chronic kidney disease, stage 3 unspecified: Secondary | ICD-10-CM | POA: Diagnosis present

## 2023-03-03 DIAGNOSIS — Z7982 Long term (current) use of aspirin: Secondary | ICD-10-CM | POA: Diagnosis not present

## 2023-03-03 DIAGNOSIS — R079 Chest pain, unspecified: Secondary | ICD-10-CM | POA: Diagnosis present

## 2023-03-03 DIAGNOSIS — R29898 Other symptoms and signs involving the musculoskeletal system: Secondary | ICD-10-CM | POA: Diagnosis not present

## 2023-03-03 DIAGNOSIS — G2581 Restless legs syndrome: Secondary | ICD-10-CM

## 2023-03-03 DIAGNOSIS — Z833 Family history of diabetes mellitus: Secondary | ICD-10-CM

## 2023-03-03 DIAGNOSIS — I69351 Hemiplegia and hemiparesis following cerebral infarction affecting right dominant side: Secondary | ICD-10-CM | POA: Diagnosis not present

## 2023-03-03 DIAGNOSIS — I70202 Unspecified atherosclerosis of native arteries of extremities, left leg: Secondary | ICD-10-CM | POA: Diagnosis present

## 2023-03-03 DIAGNOSIS — R1310 Dysphagia, unspecified: Secondary | ICD-10-CM | POA: Diagnosis not present

## 2023-03-03 DIAGNOSIS — I6389 Other cerebral infarction: Secondary | ICD-10-CM | POA: Diagnosis not present

## 2023-03-03 DIAGNOSIS — I6782 Cerebral ischemia: Secondary | ICD-10-CM | POA: Diagnosis not present

## 2023-03-03 DIAGNOSIS — I739 Peripheral vascular disease, unspecified: Secondary | ICD-10-CM | POA: Diagnosis present

## 2023-03-03 DIAGNOSIS — I2089 Other forms of angina pectoris: Secondary | ICD-10-CM | POA: Diagnosis not present

## 2023-03-03 DIAGNOSIS — I10 Essential (primary) hypertension: Secondary | ICD-10-CM | POA: Diagnosis not present

## 2023-03-03 DIAGNOSIS — I129 Hypertensive chronic kidney disease with stage 1 through stage 4 chronic kidney disease, or unspecified chronic kidney disease: Secondary | ICD-10-CM | POA: Diagnosis present

## 2023-03-03 DIAGNOSIS — I160 Hypertensive urgency: Secondary | ICD-10-CM | POA: Diagnosis present

## 2023-03-03 DIAGNOSIS — F32A Depression, unspecified: Secondary | ICD-10-CM | POA: Diagnosis present

## 2023-03-03 DIAGNOSIS — N1832 Chronic kidney disease, stage 3b: Secondary | ICD-10-CM | POA: Diagnosis present

## 2023-03-03 DIAGNOSIS — K5909 Other constipation: Secondary | ICD-10-CM | POA: Diagnosis present

## 2023-03-03 DIAGNOSIS — I69322 Dysarthria following cerebral infarction: Secondary | ICD-10-CM | POA: Diagnosis not present

## 2023-03-03 DIAGNOSIS — I779 Disorder of arteries and arterioles, unspecified: Secondary | ICD-10-CM | POA: Diagnosis not present

## 2023-03-03 DIAGNOSIS — I63312 Cerebral infarction due to thrombosis of left middle cerebral artery: Secondary | ICD-10-CM | POA: Diagnosis not present

## 2023-03-03 DIAGNOSIS — I714 Abdominal aortic aneurysm, without rupture, unspecified: Secondary | ICD-10-CM | POA: Diagnosis present

## 2023-03-03 DIAGNOSIS — J9611 Chronic respiratory failure with hypoxia: Secondary | ICD-10-CM

## 2023-03-03 DIAGNOSIS — I6381 Other cerebral infarction due to occlusion or stenosis of small artery: Principal | ICD-10-CM | POA: Diagnosis present

## 2023-03-03 DIAGNOSIS — R41841 Cognitive communication deficit: Secondary | ICD-10-CM | POA: Diagnosis not present

## 2023-03-03 DIAGNOSIS — I150 Renovascular hypertension: Secondary | ICD-10-CM | POA: Diagnosis not present

## 2023-03-03 DIAGNOSIS — J432 Centrilobular emphysema: Secondary | ICD-10-CM | POA: Diagnosis not present

## 2023-03-03 DIAGNOSIS — F419 Anxiety disorder, unspecified: Secondary | ICD-10-CM | POA: Diagnosis present

## 2023-03-03 DIAGNOSIS — M6281 Muscle weakness (generalized): Secondary | ICD-10-CM | POA: Diagnosis not present

## 2023-03-03 DIAGNOSIS — Z803 Family history of malignant neoplasm of breast: Secondary | ICD-10-CM | POA: Diagnosis not present

## 2023-03-03 DIAGNOSIS — F17210 Nicotine dependence, cigarettes, uncomplicated: Secondary | ICD-10-CM | POA: Diagnosis present

## 2023-03-03 DIAGNOSIS — R29702 NIHSS score 2: Secondary | ICD-10-CM | POA: Diagnosis present

## 2023-03-03 DIAGNOSIS — Z95820 Peripheral vascular angioplasty status with implants and grafts: Secondary | ICD-10-CM

## 2023-03-03 DIAGNOSIS — K5900 Constipation, unspecified: Secondary | ICD-10-CM | POA: Diagnosis not present

## 2023-03-03 DIAGNOSIS — I6523 Occlusion and stenosis of bilateral carotid arteries: Secondary | ICD-10-CM | POA: Diagnosis not present

## 2023-03-03 DIAGNOSIS — F319 Bipolar disorder, unspecified: Secondary | ICD-10-CM | POA: Diagnosis present

## 2023-03-03 DIAGNOSIS — E785 Hyperlipidemia, unspecified: Secondary | ICD-10-CM | POA: Diagnosis present

## 2023-03-03 DIAGNOSIS — R0789 Other chest pain: Secondary | ICD-10-CM | POA: Diagnosis present

## 2023-03-03 DIAGNOSIS — K5904 Chronic idiopathic constipation: Secondary | ICD-10-CM | POA: Diagnosis not present

## 2023-03-03 DIAGNOSIS — E039 Hypothyroidism, unspecified: Secondary | ICD-10-CM | POA: Diagnosis present

## 2023-03-03 DIAGNOSIS — R531 Weakness: Secondary | ICD-10-CM | POA: Diagnosis not present

## 2023-03-03 DIAGNOSIS — I639 Cerebral infarction, unspecified: Secondary | ICD-10-CM

## 2023-03-03 DIAGNOSIS — Z801 Family history of malignant neoplasm of trachea, bronchus and lung: Secondary | ICD-10-CM

## 2023-03-03 DIAGNOSIS — Z95828 Presence of other vascular implants and grafts: Secondary | ICD-10-CM

## 2023-03-03 DIAGNOSIS — Z720 Tobacco use: Secondary | ICD-10-CM | POA: Diagnosis present

## 2023-03-03 LAB — CBC
HCT: 38.2 % (ref 36.0–46.0)
Hemoglobin: 12 g/dL (ref 12.0–15.0)
MCH: 28.1 pg (ref 26.0–34.0)
MCHC: 31.4 g/dL (ref 30.0–36.0)
MCV: 89.5 fL (ref 80.0–100.0)
Platelets: 269 10*3/uL (ref 150–400)
RBC: 4.27 MIL/uL (ref 3.87–5.11)
RDW: 13.9 % (ref 11.5–15.5)
WBC: 6.8 10*3/uL (ref 4.0–10.5)
nRBC: 0 % (ref 0.0–0.2)

## 2023-03-03 LAB — TSH: TSH: 4.772 u[IU]/mL — ABNORMAL HIGH (ref 0.350–4.500)

## 2023-03-03 LAB — COMPREHENSIVE METABOLIC PANEL
ALT: 12 U/L (ref 0–44)
AST: 15 U/L (ref 15–41)
Albumin: 3 g/dL — ABNORMAL LOW (ref 3.5–5.0)
Alkaline Phosphatase: 71 U/L (ref 38–126)
Anion gap: 11 (ref 5–15)
BUN: 22 mg/dL (ref 8–23)
CO2: 26 mmol/L (ref 22–32)
Calcium: 9.1 mg/dL (ref 8.9–10.3)
Chloride: 101 mmol/L (ref 98–111)
Creatinine, Ser: 1.3 mg/dL — ABNORMAL HIGH (ref 0.44–1.00)
GFR, Estimated: 42 mL/min — ABNORMAL LOW (ref >60)
Glucose, Bld: 98 mg/dL (ref 70–99)
Potassium: 3.5 mmol/L (ref 3.5–5.1)
Sodium: 138 mmol/L (ref 135–145)
Total Bilirubin: 0.4 mg/dL (ref 0.3–1.2)
Total Protein: 6.5 g/dL (ref 6.5–8.1)

## 2023-03-03 LAB — LIPID PANEL
Cholesterol: 171 mg/dL (ref 0–200)
HDL: 54 mg/dL (ref >40)
LDL Cholesterol: 96 mg/dL (ref 0–99)
Total CHOL/HDL Ratio: 3.2 RATIO
Triglycerides: 103 mg/dL (ref <150)
VLDL: 21 mg/dL (ref 0–40)

## 2023-03-03 LAB — ETHANOL: Alcohol, Ethyl (B): <10 mg/dL (ref <10)

## 2023-03-03 LAB — RAPID URINE DRUG SCREEN, HOSP PERFORMED
Amphetamines: NOT DETECTED
Barbiturates: NOT DETECTED
Benzodiazepines: NOT DETECTED
Cocaine: NOT DETECTED
Opiates: NOT DETECTED
Tetrahydrocannabinol: NOT DETECTED

## 2023-03-03 LAB — PROTIME-INR
INR: 1.1 (ref 0.8–1.2)
Prothrombin Time: 13.9 seconds (ref 11.4–15.2)

## 2023-03-03 LAB — DIFFERENTIAL
Abs Immature Granulocytes: 0.03 10*3/uL (ref 0.00–0.07)
Basophils Absolute: 0 10*3/uL (ref 0.0–0.1)
Basophils Relative: 1 %
Eosinophils Absolute: 0.3 10*3/uL (ref 0.0–0.5)
Eosinophils Relative: 5 %
Immature Granulocytes: 0 %
Lymphocytes Relative: 29 %
Lymphs Abs: 2 10*3/uL (ref 0.7–4.0)
Monocytes Absolute: 0.7 10*3/uL (ref 0.1–1.0)
Monocytes Relative: 11 %
Neutro Abs: 3.7 10*3/uL (ref 1.7–7.7)
Neutrophils Relative %: 54 %

## 2023-03-03 LAB — URINALYSIS, ROUTINE W REFLEX MICROSCOPIC
Bacteria, UA: NONE SEEN
Bilirubin Urine: NEGATIVE
Glucose, UA: NEGATIVE mg/dL
Ketones, ur: NEGATIVE mg/dL
Leukocytes,Ua: NEGATIVE
Nitrite: NEGATIVE
Protein, ur: NEGATIVE mg/dL
Specific Gravity, Urine: 1.013 (ref 1.005–1.030)
pH: 7 (ref 5.0–8.0)

## 2023-03-03 LAB — HEMOGLOBIN A1C
Hgb A1c MFr Bld: 5.9 % — ABNORMAL HIGH (ref 4.8–5.6)
Mean Plasma Glucose: 122.63 mg/dL

## 2023-03-03 LAB — APTT: aPTT: 27 seconds (ref 24–36)

## 2023-03-03 LAB — I-STAT CHEM 8, ED
BUN: 25 mg/dL — ABNORMAL HIGH (ref 8–23)
Calcium, Ion: 1.22 mmol/L (ref 1.15–1.40)
Chloride: 103 mmol/L (ref 98–111)
Creatinine, Ser: 1.3 mg/dL — ABNORMAL HIGH (ref 0.44–1.00)
Glucose, Bld: 90 mg/dL (ref 70–99)
HCT: 37 % (ref 36.0–46.0)
Hemoglobin: 12.6 g/dL (ref 12.0–15.0)
Potassium: 3.6 mmol/L (ref 3.5–5.1)
Sodium: 138 mmol/L (ref 135–145)
TCO2: 28 mmol/L (ref 22–32)

## 2023-03-03 MED ORDER — ATORVASTATIN CALCIUM 40 MG PO TABS
40.0000 mg | ORAL_TABLET | Freq: Every day | ORAL | Status: DC
  Administered 2023-03-04 – 2023-03-07 (×4): 40 mg via ORAL
  Filled 2023-03-03 (×4): qty 1

## 2023-03-03 MED ORDER — CLOPIDOGREL BISULFATE 75 MG PO TABS
75.0000 mg | ORAL_TABLET | Freq: Every day | ORAL | Status: DC
  Administered 2023-03-04 – 2023-03-07 (×4): 75 mg via ORAL
  Filled 2023-03-03 (×5): qty 1

## 2023-03-03 MED ORDER — ENOXAPARIN SODIUM 40 MG/0.4ML IJ SOSY
40.0000 mg | PREFILLED_SYRINGE | INTRAMUSCULAR | Status: DC
  Administered 2023-03-03 – 2023-03-05 (×3): 40 mg via SUBCUTANEOUS
  Filled 2023-03-03 (×4): qty 0.4

## 2023-03-03 MED ORDER — SENNOSIDES-DOCUSATE SODIUM 8.6-50 MG PO TABS: 1.0000 | ORAL_TABLET | Freq: Every evening | ORAL | Status: DC | PRN

## 2023-03-03 MED ORDER — ACETAMINOPHEN 325 MG PO TABS
650.0000 mg | ORAL_TABLET | ORAL | Status: DC | PRN
  Administered 2023-03-07: 650 mg via ORAL
  Filled 2023-03-03: qty 2

## 2023-03-03 MED ORDER — CLOBETASOL PROPIONATE 0.05 % EX OINT
TOPICAL_OINTMENT | Freq: Two times a day (BID) | CUTANEOUS | Status: DC
  Filled 2023-03-03 (×4): qty 15

## 2023-03-03 MED ORDER — MELATONIN 5 MG PO TABS
10.0000 mg | ORAL_TABLET | Freq: Every evening | ORAL | Status: DC | PRN
  Administered 2023-03-03 – 2023-03-06 (×4): 10 mg via ORAL
  Filled 2023-03-03 (×4): qty 2

## 2023-03-03 MED ORDER — HYDRALAZINE HCL 20 MG/ML IJ SOLN: 10.0000 mg | INTRAMUSCULAR | Status: DC | PRN

## 2023-03-03 MED ORDER — ATORVASTATIN CALCIUM 40 MG PO TABS: 80.0000 mg | ORAL_TABLET | Freq: Every day | ORAL | Status: DC

## 2023-03-03 MED ORDER — ASPIRIN 81 MG PO TBEC
81.0000 mg | DELAYED_RELEASE_TABLET | Freq: Every day | ORAL | Status: DC
  Administered 2023-03-03 – 2023-03-07 (×5): 81 mg via ORAL
  Filled 2023-03-03 (×5): qty 1

## 2023-03-03 MED ORDER — ACETAMINOPHEN 160 MG/5ML PO SOLN: 650.0000 mg | ORAL | Status: DC | PRN

## 2023-03-03 MED ORDER — SODIUM CHLORIDE 0.9 % IV SOLN: INTRAVENOUS | Status: AC

## 2023-03-03 MED ORDER — IOHEXOL 350 MG/ML SOLN
75.0000 mL | Freq: Once | INTRAVENOUS | Status: AC | PRN
  Administered 2023-03-03: 75 mL via INTRAVENOUS

## 2023-03-03 MED ORDER — CLOPIDOGREL BISULFATE 300 MG PO TABS
300.0000 mg | ORAL_TABLET | Freq: Once | ORAL | Status: AC
  Administered 2023-03-03: 300 mg via ORAL
  Filled 2023-03-03: qty 1

## 2023-03-03 MED ORDER — ACETAMINOPHEN 650 MG RE SUPP: 650.0000 mg | RECTAL | Status: DC | PRN

## 2023-03-03 MED ORDER — NICOTINE 21 MG/24HR TD PT24
21.0000 mg | MEDICATED_PATCH | Freq: Every day | TRANSDERMAL | Status: DC
  Administered 2023-03-04 – 2023-03-07 (×4): 21 mg via TRANSDERMAL
  Filled 2023-03-03 (×4): qty 1

## 2023-03-03 MED ORDER — STROKE: EARLY STAGES OF RECOVERY BOOK
Freq: Once | Status: AC
  Filled 2023-03-03: qty 1

## 2023-03-03 NOTE — ED Notes (Signed)
ED TO INPATIENT HANDOFF REPORT  ED Nurse Name and Phone #: Lowanda Foster [phone]  S Name/Age/Gender [name] 77 y.o. female Room/Bed: 006C/006C  Code Status   Code Status: Full Code  Home/SNF/Other Home Patient oriented to: self, place, time, and situation Is this baseline? Yes   Triage Complete: Triage complete  Chief Complaint CVA (cerebral vascular accident) [facility]) [I63.9]  Triage Note Pt BIB [facility] from home alone. Pt woke up 0100 noticed R side weakness with arm and leg. LSN 2100 03/02/23. Pt states she stopped taking all meds recently ( few months) by choice. Pt also c/o chest pain/pressure, and new rash all over body. Small red bumps/patches seen on pts hands and chest. Hx:TIA    Allergies No Known Allergies  Level of Care/Admitting Diagnosis ED Disposition     ED Disposition  Admit   Condition  --   Comment  Hospital Area: [facility] [[id number]]  Level of Care: Telemetry Medical [[id number]]  May admit patient to [facility] or [facility] if equivalent level of care is available:: No  Covid Evaluation: Asymptomatic - no recent exposure (last 10 days) testing not required  Diagnosis: CVA (cerebral vascular accident) [facility]) [[id number]]  Admitting Physician: Clydie Braun [[id number]]  Attending Physician: Clydie Braun [[id number]]  Certification:: I certify this patient will need inpatient services for at least 2 midnights  Estimated Length of Stay: 2          B Medical/Surgery History Past Medical History:  Diagnosis Date   AAA (abdominal aortic aneurysm) (HCC)    3.1 cm 07/08/18, 3 year follow-up recommended; possible 3 cm AAA by aortogram 09/13/18   Anemia    PMH   Appendicitis with abscess    07/08/18, s/p perc drain; resolved 07/30/18 by CT   Arthritis    Bipolar disorder (HCC)    Cerebrovascular disease    intra and extracranial vascular dx per MRI 4/11, neurology rec strict CVRF control   Colonic inertia    Constipation     chronic;severe   Depression    Duodenitis    Gastritis    GERD (gastroesophageal reflux disease)    Hypertension    Hypothyroid 01/16/2014   Meningioma (HCC) 10/21/2013   PAD (peripheral artery disease) (HCC)    Psoriasis    sees derm   Wears dentures    Wears glasses    Past Surgical History:  Procedure Laterality Date   ABDOMINAL AORTOGRAM W/LOWER EXTREMITY N/A 09/13/2018   Procedure: ABDOMINAL AORTOGRAM W/LOWER EXTREMITY;  Surgeon: Sherren Kerns, MD;  Location: [facility] INVASIVE CV LAB;  Service: Cardiovascular;  Laterality: N/A;   arthroscopy  04/2010   Right knee   CATARACT EXTRACTION W/ INTRAOCULAR LENS  IMPLANT, BILATERAL     COLONOSCOPY     FEMORAL-POPLITEAL BYPASS GRAFT  10/22/2018   FEMORAL-POPLITEAL BYPASS GRAFT Left 10/22/2018   Procedure: LEFT FEMORAL TO BELOW THE KNEE POPLITEAL ARTERY BYPASS GRAFT;  Surgeon: Sherren Kerns, MD;  Location: [facility] OR;  Service: Vascular;  Laterality: Left;   I & D EXTREMITY Left 11/08/2018   Procedure: IRRIGATION AND DEBRIDEMENT EXTREMITY Left Leg;  Surgeon: Sherren Kerns, MD;  Location: [facility] OR;  Service: Vascular;  Laterality: Left;   IR RADIOLOGIST EVAL & MGMT  07/30/2018   MULTIPLE TOOTH EXTRACTIONS     TUBAL LIGATION       A IV Location/Drains/Wounds Patient Lines/Drains/Airways Status     Active Line/Drains/Airways     Name Placement date Placement time Site  Days   Peripheral IV 03/03/23 18 G Right Antecubital 03/03/23  --  Antecubital  less than 1            Intake/Output Last 24 hours No intake or output data in the 24 hours ending 03/03/23 1229  Labs/Imaging Results for orders placed or performed during the hospital encounter of 03/03/23 (from the past 48 hour(s))  Ethanol     Status: None   Collection Time: 03/03/23  3:36 AM  Result Value Ref Range   Alcohol, Ethyl (B) <10 <10 mg/dL    Comment: (NOTE) Lowest detectable limit for serum alcohol is 10 mg/dL.  For medical purposes only. Performed at [facility], [address], [place], [place] [address]   Protime-INR     Status: None   Collection Time: 03/03/23  3:36 AM  Result Value Ref Range   Prothrombin Time 13.9 11.4 - 15.2 seconds   INR 1.1 0.8 - 1.2    Comment: (NOTE) INR goal varies based on device and disease states. Performed at [facility], [address], [place], [place] [address]   APTT     Status: None   Collection Time: 03/03/23  3:36 AM  Result Value Ref Range   aPTT 27 24 - 36 seconds    Comment: Performed at [facility], [address], [place], [place] [address]  CBC     Status: None   Collection Time: 03/03/23  3:36 AM  Result Value Ref Range   WBC 6.8 4.0 - 10.5 K/uL   RBC 4.27 3.87 - 5.11 MIL/uL   Hemoglobin 12.0 12.0 - 15.0 g/dL   HCT 69.6 29.5 - 28.4 %   MCV 89.5 80.0 - 100.0 fL   MCH 28.1 26.0 - 34.0 pg   MCHC 31.4 30.0 - 36.0 g/dL   RDW 13.2 44.0 - 10.2 %   Platelets 269 150 - 400 K/uL   nRBC 0.0 0.0 - 0.2 %    Comment: Performed at [facility], [address], [place], [place] [address]  Differential     Status: None   Collection Time: 03/03/23  3:36 AM  Result Value Ref Range   Neutrophils Relative % 54 %   Neutro Abs 3.7 1.7 - 7.7 K/uL   Lymphocytes Relative 29 %   Lymphs Abs 2.0 0.7 - 4.0 K/uL   Monocytes Relative 11 %   Monocytes Absolute 0.7 0.1 - 1.0 K/uL   Eosinophils Relative 5 %   Eosinophils Absolute 0.3 0.0 - 0.5 K/uL   Basophils Relative 1 %   Basophils Absolute 0.0 0.0 - 0.1 K/uL   Immature Granulocytes 0 %   Abs Immature Granulocytes 0.03 0.00 - 0.07 K/uL    Comment: Performed at [facility], [address], [place], [place] [address]  Comprehensive metabolic panel     Status: Abnormal   Collection Time: 03/03/23  3:36 AM  Result Value Ref Range   Sodium 138 135 - 145 mmol/L   Potassium 3.5 3.5 - 5.1 mmol/L   Chloride 101 98 - 111 mmol/L   CO2 26 22 - 32 mmol/L   Glucose, Bld 98 70 - 99 mg/dL    Comment: Glucose reference range applies only  to samples taken after fasting for at least 8 hours.   BUN 22 8 - 23 mg/dL   Creatinine, Ser 4.74 (H) 0.44 - 1.00 mg/dL   Calcium 9.1 8.9 - 25.9 mg/dL   Total  Protein 6.5 6.5 - 8.1 g/dL   Albumin 3.0 (L) 3.5 - 5.0 g/dL   AST 15 15 - 41 U/L   ALT 12 0 - 44 U/L   Alkaline Phosphatase 71 38 - 126 U/L   Total Bilirubin 0.4 0.3 - 1.2 mg/dL   GFR, Estimated 42 (L) >60 mL/min    Comment: (NOTE) Calculated using the CKD-EPI Creatinine Equation (2021)    Anion gap 11 5 - 15    Comment: Performed at [facility], [address], [place], [place] [address]  Lipid panel     Status: None   Collection Time: 03/03/23  3:36 AM  Result Value Ref Range   Cholesterol 171 0 - 200 mg/dL   Triglycerides 244 <010 mg/dL   HDL 54 >27 mg/dL   Total CHOL/HDL Ratio 3.2 RATIO   VLDL 21 0 - 40 mg/dL   LDL Cholesterol 96 0 - 99 mg/dL    Comment:        Total Cholesterol/HDL:CHD Risk Coronary Heart Disease Risk Table                     Men   Women  1/2 Average Risk   3.4   3.3  Average Risk       5.0   4.4  2 X Average Risk   9.6   7.1  3 X Average Risk  23.4   11.0        Use the calculated Patient Ratio above and the CHD Risk Table to determine the patient's CHD Risk.        ATP III CLASSIFICATION (LDL):  <100     mg/dL   Optimal  253-664  mg/dL   Near or Above                    Optimal  130-159  mg/dL   Borderline  403-474  mg/dL   High  >259     mg/dL   Very High Performed at [facility], [address], [place], [place] [address]   Hemoglobin A1c     Status: Abnormal   Collection Time: 03/03/23  3:36 AM  Result Value Ref Range   Hgb A1c MFr Bld 5.9 (H) 4.8 - 5.6 %    Comment: (NOTE) Pre diabetes:          5.7%-6.4%  Diabetes:              >6.4%  Glycemic control for   <7.0% adults with diabetes    Mean Plasma Glucose 122.63 mg/dL    Comment: Performed at [facility], [address], [place], [place] [address]  I-stat chem 8, ED     Status: Abnormal    Collection Time: 03/03/23  3:46 AM  Result Value Ref Range   Sodium 138 135 - 145 mmol/L   Potassium 3.6 3.5 - 5.1 mmol/L   Chloride 103 98 - 111 mmol/L   BUN 25 (H) 8 - 23 mg/dL   Creatinine, Ser 2.95 (H) 0.44 - 1.00 mg/dL   Glucose, Bld 90 70 - 99 mg/dL    Comment: Glucose reference range applies only to samples taken after fasting for at least 8 hours.   Calcium, Ion 1.22 1.15 - 1.40 mmol/L   TCO2 28 22 - 32 mmol/L   Hemoglobin 12.6 12.0 - 15.0 g/dL   HCT 18.8 41.6 - 60.6 %  Urine rapid drug screen (hosp performed)  Status: None   Collection Time: 03/03/23  5:43 AM  Result Value Ref Range   Opiates NONE DETECTED NONE DETECTED   Cocaine NONE DETECTED NONE DETECTED   Benzodiazepines NONE DETECTED NONE DETECTED   Amphetamines NONE DETECTED NONE DETECTED   Tetrahydrocannabinol NONE DETECTED NONE DETECTED   Barbiturates NONE DETECTED NONE DETECTED    Comment: (NOTE) DRUG SCREEN FOR MEDICAL PURPOSES ONLY.  IF CONFIRMATION IS NEEDED FOR ANY PURPOSE, NOTIFY LAB WITHIN 5 DAYS.  LOWEST DETECTABLE LIMITS FOR URINE DRUG SCREEN Drug Class                     Cutoff (ng/mL) Amphetamine and metabolites    1000 Barbiturate and metabolites    200 Benzodiazepine                 200 Opiates and metabolites        300 Cocaine and metabolites        300 THC                            50 Performed at [facility], [address] [place], [place] [address]   Urinalysis, Routine w reflex microscopic -Urine, Clean Catch     Status: Abnormal   Collection Time: 03/03/23  5:43 AM  Result Value Ref Range   Color, Urine STRAW (A) YELLOW   APPearance CLEAR CLEAR   Specific Gravity, Urine 1.013 1.005 - 1.030   pH 7.0 5.0 - 8.0   Glucose, UA NEGATIVE NEGATIVE mg/dL   Hgb urine dipstick SMALL (A) NEGATIVE   Bilirubin Urine NEGATIVE NEGATIVE   Ketones, ur NEGATIVE NEGATIVE mg/dL   Protein, ur NEGATIVE NEGATIVE mg/dL   Nitrite NEGATIVE NEGATIVE   Leukocytes,Ua NEGATIVE NEGATIVE   RBC /  HPF 0-5 0 - 5 RBC/hpf   WBC, UA 0-5 0 - 5 WBC/hpf   Bacteria, UA NONE SEEN NONE SEEN   Squamous Epithelial / HPF 0-5 0 - 5 /HPF    Comment: Performed at [facility], [address] [place], [place] [address]   MR BRAIN WO CONTRAST  Result Date: 03/03/2023 CLINICAL DATA:  Stroke follow-up.  Right arm and leg weakness. EXAM: MRI HEAD WITHOUT CONTRAST TECHNIQUE: Multiplanar, multiecho pulse sequences of the brain and surrounding structures were obtained without intravenous contrast. COMPARISON:  Head CT and CTA from earlier today FINDINGS: Brain: 1 cm restricted diffusion in the left corona radiata. There is a background of extensive chronic small vessel ischemia in the cerebral white matter with chronic right thalamic infarct and small bilateral cerebellar infarcts. Meningioma at the left vertex measuring 13 mm. Node significant mass effect on the brain. No hydrocephalus or collection. Vascular: Normal flow voids Skull and upper cervical spine: Normal marrow signal Sinuses/Orbits: Negative IMPRESSION: 1. Acute lacunar infarct in the left corona radiata. 2. Background of extensive chronic small vessel ischemia. 3. 13 mm meningioma at the left vertex. Electronically Signed   By: Tiburcio Pea M.D.   On: 03/03/2023 06:53   CT ANGIO HEAD NECK W WO CM  Result Date: 03/03/2023 CLINICAL DATA:  Stroke follow-up. Right-sided weakness involving the arm and leg EXAM: CT ANGIOGRAPHY HEAD AND NECK WITH AND WITHOUT CONTRAST TECHNIQUE: Multidetector CT imaging of the head and neck was performed using the standard protocol during bolus administration of intravenous contrast. Multiplanar CT image reconstructions and MIPs were obtained to evaluate the vascular anatomy. Carotid stenosis measurements (when  applicable) are obtained utilizing NASCET criteria, using the distal internal carotid diameter as the denominator. RADIATION DOSE REDUCTION: This exam was performed according to the departmental dose-optimization  program which includes automated exposure control, adjustment of the mA and/or kV according to patient size and/or use of iterative reconstruction technique. CONTRAST:  75mL OMNIPAQUE IOHEXOL 350 MG/ML SOLN COMPARISON:  10/05/2022 head CT FINDINGS: CT HEAD FINDINGS Brain: Extensive chronic small vessel ischemia in the cerebral white matter with confluent low-density. Chronic right thalamic infarct. Partially calcified meningioma at the left vertex measuring 12 mm. Vascular: No hyperdense vessel or unexpected calcification. Skull: Normal. Negative for fracture or focal lesion. Sinuses/Orbits: No acute finding. Review of the MIP images confirms the above findings CTA NECK FINDINGS Aortic arch: Atheromatous plaque with 2 vessel branching. No acute finding or dilatation Right carotid system: Diffuse atheromatous plaque, primarily calcified. No ulceration, beading, or flow limiting stenosis. Left carotid system: Diffuse atheromatous plaque, primarily calcified. No ulceration, beading, or flow limiting stenosis. Vertebral arteries: No proximal subclavian flow limiting stenosis, although there is atheromatous plaque on both sides. Right dominant vertebral artery. Vertebral tortuosity on both sides, plaque causes high-grade narrowing at the left vertebral origin based on coronal reformats, patent lumen not accurately measurable due to degree of plaque and small vessel size. Skeleton: Degenerative endplate and facet spurring. Other neck: Negative Upper chest: Biapical emphysema Review of the MIP images confirms the above findings CTA HEAD FINDINGS Anterior circulation: Atheromatous calcification especially at the carotid siphons. No significant stenosis, proximal occlusion, aneurysm, or vascular malformation. Posterior circulation: The vertebral and basilar arteries are smoothly contoured and widely patent. No branch occlusion, beading, or aneurysm. Venous sinuses: Diffusely patent as permitted by contrast timing Anatomic  variants: Fetal type right PCA. Review of the MIP images confirms the above findings IMPRESSION: 1. No emergent finding. 2. Atherosclerosis with high-grade narrowing at the origin of the non dominant left vertebral artery. 3. Advanced chronic small vessel ischemia. 4. Small and non worrisome meningioma at the left vertex. Electronically Signed   By: Tiburcio Pea M.D.   On: 03/03/2023 04:50    Pending Labs Unresulted Labs (From admission, onward)     Start     Ordered   03/04/23 0500  CBC  (Labs)  Tomorrow morning,   R        03/03/23 0937   03/03/23 1146  TSH  Add-on,   AD        03/03/23 1145            Vitals/Pain Today's Vitals   03/03/23 0753 03/03/23 1039 03/03/23 1146 03/03/23 1228  BP:  (!) 169/81  (!) 191/86  Pulse:  62  65  Resp:  (!) 21  20  Temp:   97.8 F (36.6 C) 98.6 F (37 C)  TempSrc:   Oral Oral  SpO2:  95%  96%  Weight:      Height:      PainSc: 0-No pain       Isolation Precautions No active isolations  Medications Medications   stroke: early stages of recovery book (has no administration in time range)  0.9 %  sodium chloride infusion ( Intravenous New Bag/Given 03/03/23 1046)  acetaminophen (TYLENOL) tablet 650 mg (has no administration in time range)    Or  acetaminophen (TYLENOL) 160 MG/5ML solution 650 mg (has no administration in time range)    Or  acetaminophen (TYLENOL) suppository 650 mg (has no administration in time range)  senna-docusate (Senokot-S) tablet 1 tablet (has  no administration in time range)  enoxaparin (LOVENOX) injection 40 mg (has no administration in time range)  aspirin EC tablet 81 mg (81 mg Oral Given 03/03/23 1040)  clopidogrel (PLAVIX) tablet 300 mg (300 mg Oral Given 03/03/23 1041)    Followed by  clopidogrel (PLAVIX) tablet 75 mg (has no administration in time range)  nicotine (NICODERM CQ - dosed in mg/24 hours) patch 21 mg (has no administration in time range)  clobetasol ointment (TEMOVATE) 0.05 % (has no  administration in time range)  hydrALAZINE (APRESOLINE) injection 10 mg (has no administration in time range)  atorvastatin (LIPITOR) tablet 40 mg (has no administration in time range)  iohexol (OMNIPAQUE) 350 MG/ML injection 75 mL (75 mLs Intravenous Contrast Given 03/03/23 0441)    Mobility Walks with a walker     Focused Assessments Neuro Assessment Handoff:  Swallow screen pass? Yes    NIH Stroke Scale  Dizziness Present: No Headache Present: No Interval: Shift assessment Level of Consciousness (1a.)   : Alert, keenly responsive LOC Questions (1b. )   : Answers both questions correctly LOC Commands (1c. )   : Performs both tasks correctly Best Gaze (2. )  : Normal Visual (3. )  : No visual loss Facial Palsy (4. )    : Normal symmetrical movements Motor Arm, Left (5a. )   : No drift Motor Arm, Right (5b. ) : Drift Motor Leg, Left (6a. )  : No drift Motor Leg, Right (6b. ) : Drift Limb Ataxia (7. ): Absent Sensory (8. )  : Normal, no sensory loss Best Language (9. )  : No aphasia Dysarthria (10. ): Normal Extinction/Inattention (11.)   : No Abnormality Complete NIHSS TOTAL: 2     Neuro Assessment:   Neuro Checks:   Initial (03/03/23 0345)  Has TPA been given? No If patient is a Neuro Trauma and patient is going to OR before floor call report to 4N Charge nurse: [phone] or [phone]   R Recommendations: See Admitting Provider Note  Report given to:   Additional Notes:   She is q4 with her NIH last one was at 945 she is scoring for being weak on the right arm and leg. Next one is due at 1345.

## 2023-03-03 NOTE — H&P (Addendum)
History and Physical    Patient: [name] WUJ:[id number] DOB: April 07, 1946 DOA: 03/03/2023 DOS: the patient was seen and examined on 03/03/2023 PCP: Shade Flood, MD  Patient coming from: Home(lives alone) via EMS  Chief Complaint:  Chief Complaint  Patient presents with   Weakness   HPI: [name] is a 77 y.o. 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 She states that she had fallen asleep accidentally on the couch yesterday evening at around 6 PM and woke up at 9 PM with chest pain and pressure.  She reports taking one of her blood pressure medications, but cannot recall the name.  Symptoms resolved and she fell back asleep.  She thinks she woke back up at around 1 AM and noticed that she was unable to move her right hand or leg.  Normally patient has been able to ambulate with use of a rollator.  She reported having associated symptoms of tiredness, slow and relaying what she wants to say, nausea, itchy rash, and urinary frequency.  Denied having any fever, vomiting, diarrhea, or dysuria symptoms. Patient admits she had not been taking several of her medicines due to them making her feel sick.  Additional information is obtained from her brother-in-law over the phone who makes note that he was called at 1:45 AM this morning with the patient stating that she was unable to get off the couch which EMS was called.  Review of records also makes note that she had been started on amlodipine after her office visit with Dr. Antoine Poche after having syncopal on 5/29 and subsequent telephone note 6/13 notes losartan 25 mg had been added.    In the  emergency department patient was noted to have blood pressure elevated up to 209/95, and all other vital signs maintained.  CT angiogram of the head and neck did not note any emergent finding, but patient did have atherosclerosis with high-grade narrowing at the origin of nondominant left vertebral artery, and small meningioma at the left vertex.  Labs significant for BUN 25, creatinine 1.3, INR 1.1, BNP 185.7, hemoglobin A1c 5.9, high-sensitivity troponin 66->66.  Alcohol level undetectable.  Urinalysis without significant signs for infection.  UDS negative.  MRI revealed acute lacunar infarct of the left corona radiata in the background of extensive chronic small vessel ischemia.  Patient was not a candidate for thrombolytics and no large vessel occlusion noted.  Neurology recommended admission and started patient on aspirin and Plavix.  Review of Systems: As mentioned in the history of present illness. All other systems reviewed and are negative. Past Medical History:  Diagnosis Date   AAA (abdominal aortic aneurysm) (HCC)    3.1 cm 07/08/18, 3 year follow-up recommended; possible 3 cm AAA by aortogram 09/13/18   Anemia    PMH   Appendicitis with abscess    07/08/18, s/p perc drain; resolved 07/30/18 by CT   Arthritis    Bipolar disorder (HCC)    Cerebrovascular disease    intra and extracranial vascular dx per MRI 4/11, neurology rec strict CVRF control   Colonic inertia    Constipation    chronic;severe   Depression    Duodenitis    Gastritis    GERD (gastroesophageal reflux disease)    Hypertension  Hypothyroid 01/16/2014   Meningioma (HCC) 10/21/2013   PAD (peripheral artery disease) (HCC)    Psoriasis    sees derm   Wears dentures    Wears glasses    Past Surgical History:  Procedure Laterality Date   ABDOMINAL AORTOGRAM W/LOWER EXTREMITY N/A 09/13/2018   Procedure: ABDOMINAL AORTOGRAM W/LOWER EXTREMITY;  Surgeon: Sherren Kerns, MD;  Location: [facility];  Service:  Cardiovascular;  Laterality: N/A;   arthroscopy  04/2010   Right knee   CATARACT EXTRACTION W/ INTRAOCULAR LENS  IMPLANT, BILATERAL     COLONOSCOPY     FEMORAL-POPLITEAL BYPASS GRAFT  10/22/2018   FEMORAL-POPLITEAL BYPASS GRAFT Left 10/22/2018   Procedure: LEFT FEMORAL TO BELOW THE KNEE POPLITEAL ARTERY BYPASS GRAFT;  Surgeon: Sherren Kerns, MD;  Location: [facility] OR;  Service: Vascular;  Laterality: Left;   I & D EXTREMITY Left 11/08/2018   Procedure: IRRIGATION AND DEBRIDEMENT EXTREMITY Left Leg;  Surgeon: Sherren Kerns, MD;  Location: [facility] OR;  Service: Vascular;  Laterality: Left;   IR RADIOLOGIST EVAL & MGMT  07/30/2018   MULTIPLE TOOTH EXTRACTIONS     TUBAL LIGATION     Social History:  reports that she has been smoking cigarettes. She has a 30.00 pack-year smoking history. She has never used smokeless tobacco. She reports current alcohol use. She reports that she does not use drugs.  No Known Allergies  Family History  Problem Relation Age of Onset   Breast cancer Mother        metastisis to bones   Cancer Sister 72       spinal   Alcohol abuse Brother    Heart disease Brother    Lung cancer Brother        smoked   Diabetes Son    Heart disease Maternal Aunt    Heart disease Other        grandfather    Colon cancer Neg Hx    Esophageal cancer Neg Hx    Stomach cancer Neg Hx     Prior to Admission medications   Medication Sig Start Date End Date Taking? Authorizing Provider  amLODipine (NORVASC) 5 MG tablet Take 1 tablet (5 mg total) by mouth daily. 02/07/23  Yes Rollene Rotunda, MD  clonazePAM (KLONOPIN) 1 MG tablet Take 0.5-1 tablets by mouth daily as needed for anxiety (sleep).   Yes [provider]  famotidine (PEPCID) 20 MG tablet Take 20 mg by mouth daily as needed for indigestion.   Yes [provider]  losartan (COZAAR) 25 MG tablet Take 1 tablet (25 mg total) by mouth daily. 02/23/23 02/23/24 Yes Rollene Rotunda, MD  Melatonin 10 MG TABS Take 10  mg by mouth at bedtime as needed (for sleep).   Yes [provider]  rOPINIRole (REQUIP) 2 MG tablet TAKE 1 TABLET(2 MG) BY MOUTH AT BEDTIME Patient taking differently: Take 2 mg by mouth at bedtime as needed (for restless leg). 10/17/22  Yes Cottle, Steva Ready., MD  triamcinolone cream (KENALOG) 0.1 % Apply 1 Application topically 2 (two) times daily. 02/13/23  Yes Henderly, Britni A, PA-C  albuterol (VENTOLIN HFA) 108 (90 Base) MCG/ACT inhaler Inhale 1-2 puffs into the lungs every 6 (six) hours as needed for wheezing or shortness of breath. Patient not taking: Reported on 02/07/2023 06/09/22   Shade Flood, MD  aspirin EC 81 MG tablet Take 81 mg by mouth daily. Swallow whole. Patient not taking: Reported on 03/03/2023    [provider]  DULoxetine (CYMBALTA) 30 MG capsule Take 1 capsule (30 mg total) by mouth 2 (two) times daily. Patient not taking: Reported on 03/03/2023 11/03/21   Lauraine Rinne., MD  HYDROcodone-acetaminophen (NORCO/VICODIN) 5-325 MG tablet Take 1 tablet by mouth every 6 (six) hours as needed. Patient not taking: Reported on 03/03/2023 02/13/23   Henderly, Britni A, PA-C  lamoTRIgine (LAMICTAL) 25 MG tablet Take 3 tablets (75 mg total) by mouth daily. Patient not taking: Reported on 02/07/2023 11/03/21   Lauraine Rinne., MD  OLANZapine zydis (ZYPREXA) 5 MG disintegrating tablet Take 1 tablet (5 mg total) by mouth at bedtime. Patient not taking: Reported on 03/03/2023 02/09/23   Lauraine Rinne., MD    Physical Exam: Vitals:   03/03/23 0530 03/03/23 0600 03/03/23 0745 03/03/23 0750  BP: (!) 209/95 (!) 165/94 (!) 167/90 (!) 181/79  Pulse: 83 72 69 69  Resp: 18 20 17 18   Temp:    98 F (36.7 C)  TempSrc:    Oral  SpO2: 96% 95% 94% 94%  Weight:      Height:       Constitutional: Elderly female who appears to be no acute distress and able to follow commands Eyes: PERRL, lids and conjunctivae normal ENMT: Mucous membranes are moist.  Fair  dentition. Neck: normal, supple Respiratory: Some inspiratory crackles appreciated.  No significant wheezing or rhonchi.  O2 saturation currently maintained on room air. Cardiovascular: Regular rate and rhythm,  No extremity edema. 2+ pedal pulses Abdomen: no tenderness, no masses palpated. Bowel sounds positive.  Musculoskeletal: no clubbing / cyanosis. No joint deformity upper and lower extremities. Good ROM, no contractures. Normal muscle tone.  Skin: Plaque-like rash noted of the palms of the hands and knuckles as well as extensor surfaces of the lower extremities Neurologic: CN 2-12 grossly intact.  Strength 3/5 in the right upper and lower extremity.  Strength 5/5 in the Psychiatric: Normal judgment and insight. Alert and oriented x 3.  Intermittently disgruntled. Data Reviewed:  EKG reveals sinus rhythm at 75 bpm with biatrial enlargement.  Reviewed labs, imaging, and pertinent records as noted in this document.  Assessment and Plan:  CVA Acute.  Patient presents with complaints of right arm and leg weakness starting at 1 AM this morning.  Last noted to be normal at around 9 PM last night.  MRI revealed an acute lacunar infarct of the left corona radiata.  Risk factors include uncontrolled hypertension, tobacco abuse, PVD, and prior CVA back in 2019.  Patient has been started on aspirin and Plavix. -Admit to a telemetry bed -Stroke order set utilized -Neurochecks -Check echocardiogram -PT/OT/speech to eval and treat -Aspirin and Plavix -Neurology consulted, we will follow-up for any further recommendations  Hypertensive urgency Initial blood pressures elevated up to 209/95.  Appears patient had recently seen by cardiology and started on amlodipine and losartan.  However, patient did not report taking medications regularly. -Allow for permissive hypertension at this time hydralazine IV as needed for systolic blood pressure greater than 220 or diastolic greater than 120.  Elevated  troponin Chest pain Patient reported waking up last night with chest pain around 9 PM for which she took one of her blood pressure medicines.  High-sensitivity troponin 66->66.  EKG without significant ischemic changes. -Follow-up echocardiogram  Chronic kidney disease stage IIIb Creatinine 1.3 which appears around patient's baseline. -Continue to monitor  Hyperlipidemia Lipid panel noted total cholesterol 171, triglycerides 103, HDL 54, and LDL 96. -Goal  LDL less than 70 -Atorvastatin 40 due to age and reported intolerance to other medicines.  Psoriasis Patient with psoriatic rash of the hands upper and lower extremities.  Denied taking anything for treatment. -Clobetasol ointment to affected areas  Peripheral vascular disease Patient noted have a prior history of atheroembolic disease for which she had been on Xarelto in the past but stopped several years ago.  Not on any medications for treatment. -Aspirin, Plavix, and statin  Question of hypothyroidism Patient not appear to be on any medication for replacement. -Check TSH and free T4  Major depressive disorder Anxiety Patient appears to be followed in the outpatient setting by behavioral health, but reported not taking medication of Lamictal and Cymbalta.  Idiopathic pulmonary fibrosis Respiratory crackles appreciated on physical exam.  O2 saturations currently appear to be maintained on room air.  History of CVA Patient with prior CVA back in 2019.  Meningioma CT imaging noted small nonworrisome meningioma of the left vertex which was noted to be approximately 13 mm on MRI.  Tobacco abuse Patient reports smoking a pack cigarettes per day on average. -Counseled on need of cessation of tobacco use -Nicotine patch offered  Debility Patient got around with use of a rolling walker at baseline.  DVT prophylaxis: Lovenox Advance Care Planning:   Code Status: Full Code   Consults: Neurology  Family Communication:  Patient brother-in-law updated over the phone  Severity of Illness: The appropriate patient status for this patient is INPATIENT. Inpatient status is judged to be reasonable and necessary in order to provide the required intensity of service to ensure the patient's safety. The patient's presenting symptoms, physical exam findings, and initial radiographic and laboratory data in the context of their chronic comorbidities is felt to place them at high risk for further clinical deterioration. Furthermore, it is not anticipated that the patient will be medically stable for discharge from the hospital within 2 midnights of admission.   * I certify that at the point of admission it is my clinical judgment that the patient will require inpatient hospital care spanning beyond 2 midnights from the point of admission due to high intensity of service, high risk for further deterioration and high frequency of surveillance required.*  Author: Clydie Braun, MD 03/03/2023 9:08 AM  For on call review [email or web address].

## 2023-03-03 NOTE — ED Provider Notes (Signed)
Emergency Department Provider Note   I have reviewed the triage vital signs and the nursing notes.   HISTORY  Chief Complaint Weakness   HPI [name] is a 77 y.o. female past history of reviewed below including hypertension, peripheral artery disease, prior stroke presents to the emergency department with acute onset right arm and leg weakness.  Patient was last normal at 6 PM.  She is typically able to ambulate without significant assistance.  She tells me that she laid down on the couch and fell asleep at some point woke up at 9 PM with some mild chest pressure.  She has been off of all of her medications for the past several months.  She took a losartan and states that her chest pain improved and is no longer present.  It was at that time that she also noticed she had some weakness in her right arm and leg.  She was unable to stand or walk.  She called for family to unlock her door and ultimately called EMS later in the evening. No HA. No vision changes. No numbness.    Past Medical History:  Diagnosis Date   AAA (abdominal aortic aneurysm) (HCC)    3.1 cm 07/08/18, 3 year follow-up recommended; possible 3 cm AAA by aortogram 09/13/18   Anemia    PMH   Appendicitis with abscess    07/08/18, s/p perc drain; resolved 07/30/18 by CT   Arthritis    Bipolar disorder (HCC)    Cerebrovascular disease    intra and extracranial vascular dx per MRI 4/11, neurology rec strict CVRF control   Colonic inertia    Constipation    chronic;severe   Depression    Duodenitis    Gastritis    GERD (gastroesophageal reflux disease)    Hypertension    Hypothyroid 01/16/2014   Meningioma (HCC) 10/21/2013   PAD (peripheral artery disease) (HCC)    Psoriasis    sees derm   Wears dentures    Wears glasses     Review of Systems  Constitutional: No fever/chills Eyes: No visual changes. Cardiovascular: Denies chest pain. Respiratory: Denies shortness of breath. Gastrointestinal: No  abdominal pain.   Musculoskeletal: Negative for back pain. Skin: Negative for rash. Neurological: Negative for headaches or numbness. Positive right arm/leg weakness.    ____________________________________________   PHYSICAL EXAM:  VITAL SIGNS: ED Triage Vitals  Enc Vitals Group     BP 03/03/23 0316 (!) 151/77     Pulse Rate 03/03/23 0316 74     Resp 03/03/23 0316 16     Temp 03/03/23 0316 98.1 F (36.7 C)     Temp Source 03/03/23 0316 Oral     SpO2 03/03/23 0316 96 %     Weight 03/03/23 0330 175 lb (79.4 kg)     Height 03/03/23 0330 5\' 7"  (1.702 m)   Constitutional: Alert and oriented. Well appearing and in no acute distress. Eyes: Conjunctivae are normal. PERRL. EOMI. Head: Atraumatic. Nose: No congestion/rhinnorhea. Mouth/Throat: Mucous membranes are moist.  Oropharynx non-erythematous. Neck: No stridor.   Cardiovascular: Normal rate, regular rhythm. Good peripheral circulation. Grossly normal heart sounds.   Respiratory: Normal respiratory effort.  No retractions. Lungs CTAB. Gastrointestinal: Soft and nontender. No distention. Musculoskeletal: No lower extremity tenderness nor edema. No gross deformities of extremities. Neurologic:  Normal speech and language. 4+/5 strength in the RUE/RLE with 5/5 strength on the left. Normal sensation.  Skin:  Skin is warm, dry and intact. Erythematous plagues to the  bilateral palms.    ____________________________________________   LABS (all labs ordered are listed, but only abnormal results are displayed)  Labs Reviewed  COMPREHENSIVE METABOLIC PANEL - Abnormal; Notable for the following components:      Result Value   Creatinine, Ser 1.30 (*)    Albumin 3.0 (*)    GFR, Estimated 42 (*)    All other components within normal limits  URINALYSIS, ROUTINE W REFLEX MICROSCOPIC - Abnormal; Notable for the following components:   Color, Urine STRAW (*)    Hgb urine dipstick SMALL (*)    All other components within normal limits   I-STAT CHEM 8, ED - Abnormal; Notable for the following components:   BUN 25 (*)    Creatinine, Ser 1.30 (*)    All other components within normal limits  ETHANOL  PROTIME-INR  APTT  CBC  DIFFERENTIAL  RAPID URINE DRUG SCREEN, HOSP PERFORMED   ____________________________________________  EKG   EKG Interpretation  Date/Time:  Saturday March 03 2023 03:08:09 EDT Ventricular Rate:  75 PR Interval:  182 QRS Duration: 115 QT Interval:  368 QTC Calculation: 411 R Axis:   -18 Text Interpretation: Sinus rhythm LAE, consider biatrial enlargement LVH with secondary repolarization abnormality Anterior Q waves, possibly due to LVH Confirmed by Alona Bene [id number]) on 03/03/2023 4:08:38 AM        ___________________________________________  RADIOLOGY  MR BRAIN WO CONTRAST  Result Date: 03/03/2023 CLINICAL DATA:  Stroke follow-up.  Right arm and leg weakness. EXAM: MRI HEAD WITHOUT CONTRAST TECHNIQUE: Multiplanar, multiecho pulse sequences of the brain and surrounding structures were obtained without intravenous contrast. COMPARISON:  Head CT and CTA from earlier today FINDINGS: Brain: 1 cm restricted diffusion in the left corona radiata. There is a background of extensive chronic small vessel ischemia in the cerebral white matter with chronic right thalamic infarct and small bilateral cerebellar infarcts. Meningioma at the left vertex measuring 13 mm. Node significant mass effect on the brain. No hydrocephalus or collection. Vascular: Normal flow voids Skull and upper cervical spine: Normal marrow signal Sinuses/Orbits: Negative IMPRESSION: 1. Acute lacunar infarct in the left corona radiata. 2. Background of extensive chronic small vessel ischemia. 3. 13 mm meningioma at the left vertex. Electronically Signed   By: Tiburcio Pea M.D.   On: 03/03/2023 06:53   CT ANGIO HEAD NECK W WO CM  Result Date: 03/03/2023 CLINICAL DATA:  Stroke follow-up. Right-sided weakness involving the arm and  leg EXAM: CT ANGIOGRAPHY HEAD AND NECK WITH AND WITHOUT CONTRAST TECHNIQUE: Multidetector CT imaging of the head and neck was performed using the standard protocol during bolus administration of intravenous contrast. Multiplanar CT image reconstructions and MIPs were obtained to evaluate the vascular anatomy. Carotid stenosis measurements (when applicable) are obtained utilizing NASCET criteria, using the distal internal carotid diameter as the denominator. RADIATION DOSE REDUCTION: This exam was performed according to the departmental dose-optimization program which includes automated exposure control, adjustment of the mA and/or kV according to patient size and/or use of iterative reconstruction technique. CONTRAST:  75mL OMNIPAQUE IOHEXOL 350 MG/ML SOLN COMPARISON:  10/05/2022 head CT FINDINGS: CT HEAD FINDINGS Brain: Extensive chronic small vessel ischemia in the cerebral white matter with confluent low-density. Chronic right thalamic infarct. Partially calcified meningioma at the left vertex measuring 12 mm. Vascular: No hyperdense vessel or unexpected calcification. Skull: Normal. Negative for fracture or focal lesion. Sinuses/Orbits: No acute finding. Review of the MIP images confirms the above findings CTA NECK FINDINGS Aortic arch: Atheromatous plaque with 2  vessel branching. No acute finding or dilatation Right carotid system: Diffuse atheromatous plaque, primarily calcified. No ulceration, beading, or flow limiting stenosis. Left carotid system: Diffuse atheromatous plaque, primarily calcified. No ulceration, beading, or flow limiting stenosis. Vertebral arteries: No proximal subclavian flow limiting stenosis, although there is atheromatous plaque on both sides. Right dominant vertebral artery. Vertebral tortuosity on both sides, plaque causes high-grade narrowing at the left vertebral origin based on coronal reformats, patent lumen not accurately measurable due to degree of plaque and small vessel size.  Skeleton: Degenerative endplate and facet spurring. Other neck: Negative Upper chest: Biapical emphysema Review of the MIP images confirms the above findings CTA HEAD FINDINGS Anterior circulation: Atheromatous calcification especially at the carotid siphons. No significant stenosis, proximal occlusion, aneurysm, or vascular malformation. Posterior circulation: The vertebral and basilar arteries are smoothly contoured and widely patent. No branch occlusion, beading, or aneurysm. Venous sinuses: Diffusely patent as permitted by contrast timing Anatomic variants: Fetal type right PCA. Review of the MIP images confirms the above findings IMPRESSION: 1. No emergent finding. 2. Atherosclerosis with high-grade narrowing at the origin of the non dominant left vertebral artery. 3. Advanced chronic small vessel ischemia. 4. Small and non worrisome meningioma at the left vertex. Electronically Signed   By: Tiburcio Pea M.D.   On: 03/03/2023 04:50    ____________________________________________   PROCEDURES  Procedure(s) performed:   Procedures  None  ____________________________________________   INITIAL IMPRESSION / ASSESSMENT AND PLAN / ED COURSE  Pertinent labs & imaging results that were available during my care of the patient were reviewed by me and considered in my medical decision making (see chart for details).   This patient is Presenting for Evaluation of weakness, which does require a range of treatment options, and is a complaint that involves a high risk of morbidity and mortality.  The Differential Diagnoses include CVA, complex migraine, TIA, electrolyte disturbance, etc.  Critical Interventions-    Medications  iohexol (OMNIPAQUE) 350 MG/ML injection 75 mL (75 mLs Intravenous Contrast Given 03/03/23 0441)    Reassessment after intervention:     Clinical Laboratory Tests Ordered, included CMP with creatinine to 1.30. Normal potassium. UA without infection.   Radiologic Tests  Ordered, included CTA head/neck and MRI brain. I independently interpreted the images and agree with radiology interpretation.   Cardiac Monitor Tracing which shows NSR.    Social Determinants of Health Risk patient is a smoker.   Consult complete with Neurology. Plan for MRI and reassess.   Discussed MRI results with Neurology. They will consult.   Medical Decision Making: Summary:  Patient with acute onset right arm/leg weakness. Arrives outside of the TNK window. No LVO symptoms. Plan for CTA and discussed case with neurology.   Reevaluation with update and discussion with patient. MRI with acute CVA. Plan for admit for CVA workup. Patient in agreement.   Patient's presentation is most consistent with acute presentation with potential threat to life or bodily function.   Disposition: admit  ____________________________________________  FINAL CLINICAL IMPRESSION(S) / ED DIAGNOSES  Final diagnoses:  Cerebrovascular accident (CVA), unspecified mechanism (HCC)    Note:  This document was prepared using Dragon voice recognition software and may include unintentional dictation errors.  Alona Bene, MD, [facility]    Tashon Capp, Arlyss Repress, MD 03/04/23 [phone]

## 2023-03-03 NOTE — Evaluation (Signed)
Speech Language Pathology Evaluation Patient Details Name: [name] MRN: [id number] DOB: 1946-02-02 Today's Date: 03/03/2023 Time: 9562-1308 SLP Time Calculation (min) (ACUTE ONLY): 14 min  Problem List:  Patient Active Problem List   Diagnosis Date Noted   CVA (cerebral vascular accident) (HCC) 03/03/2023   Elevated troponin 03/03/2023   Hypertensive urgency 03/03/2023   CKD (chronic kidney disease), stage III (HCC) 03/03/2023   Psoriasis 03/03/2023   Centrilobular emphysema (HCC) 10/19/2020   Chronic respiratory failure with hypoxia (HCC) 12/26/2019   Shortness of breath 12/26/2019   Bilateral pulmonary infiltrates on CXR    Complicated UTI (urinary tract infection) 11/11/2019   AKI (acute kidney injury) (HCC) 11/11/2019   Hyponatremia 11/11/2019   Elevated LFTs 11/11/2019   Sepsis (HCC) 11/11/2019   Wound infection 11/07/2018   Postoperative pain    Sleep disturbance    Tobacco abuse    Acute blood loss anemia    Hypoalbuminemia due to protein-calorie malnutrition (HCC)    Debility 10/24/2018   Pre-operative cardiovascular examination 09/26/2018   HLD (hyperlipidemia) 09/26/2018   GAD (generalized anxiety disorder) 08/07/2018   Major depressive disorder, single episode 08/07/2018   Appendiceal abscess 07/08/2018   PVD (peripheral vascular disease) (HCC) 03/15/2018   Atherosclerosis of native artery of left lower extremity with intermittent claudication (HCC) 03/15/2018   Chronic left-sided low back pain with left-sided sciatica 12/25/2017   Facial droop    History of CVA (cerebrovascular accident) 10/23/2017   Critical lower limb ischemia (HCC) 11/08/2016   Smoker 11/08/2016   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 01/16/2014   Mild cognitive impairment 01/16/2014   Meningioma (HCC) 10/21/2013   Chest pain 10/20/2013   Medicare annual wellness visit, subsequent 06/16/2013   Dizziness and giddiness  06/16/2013   RLS (restless legs syndrome) 04/29/2012   Abdominal pain 12/27/2010   DEGENERATIVE JOINT DISEASE 09/23/2010   CAROTID ARTERY DISEASE 08/15/2010   CHEST PAIN 08/15/2010   HEADACHE 12/02/2009   BACK PAIN 11/22/2009   CONSTIPATION, CHRONIC 01/29/2008   NAUSEA 01/28/2008   Depression 03/08/2007   HTN (hypertension) 03/08/2007   Past Medical History:  Past Medical History:  Diagnosis Date   AAA (abdominal aortic aneurysm) (HCC)    3.1 cm 07/08/18, 3 year follow-up recommended; possible 3 cm AAA by aortogram 09/13/18   Anemia    PMH   Appendicitis with abscess    07/08/18, s/p perc drain; resolved 07/30/18 by CT   Arthritis    Bipolar disorder (HCC)    Cerebrovascular disease    intra and extracranial vascular dx per MRI 4/11, neurology rec strict CVRF control   Colonic inertia    Constipation    chronic;severe   Depression    Duodenitis    Gastritis    GERD (gastroesophageal reflux disease)    Hypertension    Hypothyroid 01/16/2014   Meningioma (HCC) 10/21/2013   PAD (peripheral artery disease) (HCC)    Psoriasis    sees derm   Wears dentures    Wears glasses    Past Surgical History:  Past Surgical History:  Procedure Laterality Date   ABDOMINAL AORTOGRAM W/LOWER EXTREMITY N/A 09/13/2018   Procedure: ABDOMINAL AORTOGRAM W/LOWER EXTREMITY;  Surgeon: Sherren Kerns, MD;  Location: [facility];  Service: Cardiovascular;  Laterality: N/A;   arthroscopy  04/2010   Right knee   CATARACT EXTRACTION W/ INTRAOCULAR LENS  IMPLANT, BILATERAL     COLONOSCOPY     FEMORAL-POPLITEAL BYPASS  GRAFT  10/22/2018   FEMORAL-POPLITEAL BYPASS GRAFT Left 10/22/2018   Procedure: LEFT FEMORAL TO BELOW THE KNEE POPLITEAL ARTERY BYPASS GRAFT;  Surgeon: Sherren Kerns, MD;  Location: [facility] OR;  Service: Vascular;  Laterality: Left;   I & D EXTREMITY Left 11/08/2018   Procedure: IRRIGATION AND DEBRIDEMENT EXTREMITY Left Leg;  Surgeon: Sherren Kerns, MD;  Location: [facility] OR;   Service: Vascular;  Laterality: Left;   IR RADIOLOGIST EVAL & MGMT  07/30/2018   MULTIPLE TOOTH EXTRACTIONS     TUBAL LIGATION     HPI:  77 y.o. female admitted with right-sided weakness.  MRI revealed acute lacunar infarct of the left corona radiata in the background of extensive chronic small vessel ischemia. 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   Assessment / Plan / Recommendation Clinical Impression  Pt does not present with any obvious aphasia or dysarthria.  Speech was rapid, fluent. Repetition intact. Naming WNL. Followed commands and answered questions with good yes/no reliability. Reading intact. Able to express ideas coherently.  Uncomfortable due to new rash, mildly irritable and anxious, but no apparent changes in language/cognition, not unexpected given size of infarct. No SLP f/u is needed. Our service will sign off.    SLP Assessment  SLP Recommendation/Assessment: Patient does not need any further Speech Lanaguage Pathology Services SLP Visit Diagnosis: Cognitive communication deficit (R41.841)    Recommendations for follow up therapy are one component of a multi-disciplinary discharge planning process, led by the attending physician.  Recommendations may be updated based on patient status, additional functional criteria and insurance authorization.    Follow Up Recommendations  No SLP follow up    Assistance Recommended at Discharge   (pt has personal care assistant)                 SLP Evaluation Cognition  Arousal/Alertness: Awake/alert Orientation Level: Oriented to person;Oriented to place;Oriented to time Attention: Sustained Sustained Attention: Appears intact       Comprehension  Auditory  Comprehension Overall Auditory Comprehension: Appears within functional limits for tasks assessed Yes/No Questions: Within Functional Limits Commands: Within Functional Limits Conversation: Simple Visual Recognition/Discrimination Discrimination: Within Function Limits Reading Comprehension Reading Status: Within funtional limits    Expression Expression Primary Mode of Expression: Verbal Verbal Expression Overall Verbal Expression: Appears within functional limits for tasks assessed Initiation: No impairment Automatic Speech: Social Response;Day of week Level of Generative/Spontaneous Verbalization: Conversation Repetition: No impairment Naming: No impairment Pragmatics: No impairment Written Expression Dominant Hand: Right   Oral / Motor  Oral Motor/Sensory Function Overall Oral Motor/Sensory Function: Within functional limits Motor Speech Overall Motor Speech: Appears within functional limits for tasks assessed            Blenda Mounts Laurice 03/03/2023, 3:34 PM Marchelle Folks L. Samson Frederic, MA CCC/SLP Clinical Specialist - Acute Care SLP Acute Rehabilitation Services Office number [phone]

## 2023-03-03 NOTE — Consult Note (Signed)
Neurology Consultation  Reason for Consult: Stroke on MRI brain  Referring Physician: Dr. Katrinka Blazing  CC: Right upper and lower extremity weakness  History is obtained from: Patient, Chart review  HPI: [name] is a 77 y.o. female medical history significant for essential hypertension, hypothyroidism, meningioma, PAD, CKD stage IIIb, psoriasis, depression, bipolar disorder, long-term tobacco smoker, and history of lacunar left corona radiata CVA in February of 2019 discharged on aspirin and atorvastatin presented to the ED 03/03/23 for evaluation of right-sided weakness, chest pain, and full body rash.  Patient states that on 03/02/23 around 2100 she began to have chest pain and pressure.  Sometimes shortly afterwards, patient fell asleep on the couch and woke up at approximately 23:30 and noted that her right arm was weak.  Patient states that she felt at that time that she had a stroke prompting further evaluation in the ED.  Patient does state that she stopped all of her medications a few months ago as she felt that they were not helping her at all.   At baseline, patient lives by herself, manages her own medications, walks short distances with a Rollator before having to stop and sit, and states that she has to be able to ambulate to live independently.    LKW: 03/02/2023 21:00 TNK given?: no, patient presented outside of the thrombolytic therapy time window IR Thrombectomy? No, presentation is not consistent with LVO Modified Rankin Scale: 3-Moderate disability-requires help but walks WITHOUT assistance  ROS: A complete ROS was performed and is negative except as noted in the HPI.   Past Medical History:  Diagnosis Date   AAA (abdominal aortic aneurysm) (HCC)    3.1 cm 07/08/18, 3 year follow-up recommended; possible 3 cm AAA by aortogram 09/13/18   Anemia    PMH   Appendicitis with abscess    07/08/18, s/p perc drain; resolved 07/30/18 by CT   Arthritis    Bipolar disorder (HCC)     Cerebrovascular disease    intra and extracranial vascular dx per MRI 4/11, neurology rec strict CVRF control   Colonic inertia    Constipation    chronic;severe   Depression    Duodenitis    Gastritis    GERD (gastroesophageal reflux disease)    Hypertension    Hypothyroid 01/16/2014   Meningioma (HCC) 10/21/2013   PAD (peripheral artery disease) (HCC)    Psoriasis    sees derm   Wears dentures    Wears glasses    Past Surgical History:  Procedure Laterality Date   ABDOMINAL AORTOGRAM W/LOWER EXTREMITY N/A 09/13/2018   Procedure: ABDOMINAL AORTOGRAM W/LOWER EXTREMITY;  Surgeon: Sherren Kerns, MD;  Location: [facility];  Service: Cardiovascular;  Laterality: N/A;   arthroscopy  04/2010   Right knee   CATARACT EXTRACTION W/ INTRAOCULAR LENS  IMPLANT, BILATERAL     COLONOSCOPY     FEMORAL-POPLITEAL BYPASS GRAFT  10/22/2018   FEMORAL-POPLITEAL BYPASS GRAFT Left 10/22/2018   Procedure: LEFT FEMORAL TO BELOW THE KNEE POPLITEAL ARTERY BYPASS GRAFT;  Surgeon: Sherren Kerns, MD;  Location: [facility] OR;  Service: Vascular;  Laterality: Left;   I & D EXTREMITY Left 11/08/2018   Procedure: IRRIGATION AND DEBRIDEMENT EXTREMITY Left Leg;  Surgeon: Sherren Kerns, MD;  Location: [facility] OR;  Service: Vascular;  Laterality: Left;   IR RADIOLOGIST EVAL & MGMT  07/30/2018   MULTIPLE TOOTH EXTRACTIONS     TUBAL LIGATION     Family History  Problem Relation Age of  Onset   Breast cancer Mother        metastisis to bones   Cancer Sister 57       spinal   Alcohol abuse Brother    Heart disease Brother    Lung cancer Brother        smoked   Diabetes Son    Heart disease Maternal Aunt    Heart disease Other        grandfather    Colon cancer Neg Hx    Esophageal cancer Neg Hx    Stomach cancer Neg Hx    Social History:   reports that she has been smoking cigarettes. She has a 30.00 pack-year smoking history. She has never used smokeless tobacco. She reports current alcohol use. She  reports that she does not use drugs.  Medications  Current Facility-Administered Medications:    [START ON 03/04/2023]  stroke: early stages of recovery book, , Does not apply, Once, Smith, Rondell A, MD   0.9 %  sodium chloride infusion, , Intravenous, Continuous, Smith, Rondell A, MD   acetaminophen (TYLENOL) tablet 650 mg, 650 mg, Oral, Q4H PRN **OR** acetaminophen (TYLENOL) 160 MG/5ML solution 650 mg, 650 mg, Per Tube, Q4H PRN **OR** acetaminophen (TYLENOL) suppository 650 mg, 650 mg, Rectal, Q4H PRN, Katrinka Blazing, Rondell A, MD   aspirin EC tablet 81 mg, 81 mg, Oral, Daily, Smith, Rondell A, MD   enoxaparin (LOVENOX) injection 40 mg, 40 mg, Subcutaneous, Q24H, Smith, Rondell A, MD   senna-docusate (Senokot-S) tablet 1 tablet, 1 tablet, Oral, QHS PRN, Clydie Braun, MD  Current Outpatient Medications:    amLODipine (NORVASC) 5 MG tablet, Take 1 tablet (5 mg total) by mouth daily., Disp: 90 tablet, Rfl: 3   clonazePAM (KLONOPIN) 1 MG tablet, Take 0.5-1 tablets by mouth daily as needed for anxiety (sleep)., Disp: , Rfl:    famotidine (PEPCID) 20 MG tablet, Take 20 mg by mouth daily as needed for indigestion., Disp: , Rfl:    losartan (COZAAR) 25 MG tablet, Take 1 tablet (25 mg total) by mouth daily., Disp: 90 tablet, Rfl: 3   Melatonin 10 MG TABS, Take 10 mg by mouth at bedtime as needed (for sleep)., Disp: , Rfl:    rOPINIRole (REQUIP) 2 MG tablet, TAKE 1 TABLET(2 MG) BY MOUTH AT BEDTIME (Patient taking differently: Take 2 mg by mouth at bedtime as needed (for restless leg).), Disp: 60 tablet, Rfl: 0   triamcinolone cream (KENALOG) 0.1 %, Apply 1 Application topically 2 (two) times daily., Disp: 30 g, Rfl: 0   albuterol (VENTOLIN HFA) 108 (90 Base) MCG/ACT inhaler, Inhale 1-2 puffs into the lungs every 6 (six) hours as needed for wheezing or shortness of breath. (Patient not taking: Reported on 02/07/2023), Disp: 18 g, Rfl: 2   aspirin EC 81 MG tablet, Take 81 mg by mouth daily. Swallow whole.  (Patient not taking: Reported on 03/03/2023), Disp: , Rfl:    DULoxetine (CYMBALTA) 30 MG capsule, Take 1 capsule (30 mg total) by mouth 2 (two) times daily. (Patient not taking: Reported on 03/03/2023), Disp: 180 capsule, Rfl: 1   HYDROcodone-acetaminophen (NORCO/VICODIN) 5-325 MG tablet, Take 1 tablet by mouth every 6 (six) hours as needed. (Patient not taking: Reported on 03/03/2023), Disp: 10 tablet, Rfl: 0   lamoTRIgine (LAMICTAL) 25 MG tablet, Take 3 tablets (75 mg total) by mouth daily. (Patient not taking: Reported on 02/07/2023), Disp: 270 tablet, Rfl: 1   OLANZapine zydis (ZYPREXA) 5 MG disintegrating tablet, Take 1 tablet (5  mg total) by mouth at bedtime. (Patient not taking: Reported on 03/03/2023), Disp: 30 tablet, Rfl: 0  Exam: Current vital signs: BP (!) 181/79 (BP Location: Left Arm)   Pulse 69   Temp 98 F (36.7 C) (Oral)   Resp 18   Ht 5\' 7"  (1.702 m)   Wt 79.4 kg   SpO2 94%   BMI 27.41 kg/m  Vital signs in last 24 hours: Temp:  [98 F (36.7 C)-98.1 F (36.7 C)] 98 F (36.7 C) (06/22 0750) Pulse Rate:  [62-83] 69 (06/22 0750) Resp:  [13-21] 18 (06/22 0750) BP: (151-209)/(77-95) 181/79 (06/22 0750) SpO2:  [94 %-98 %] 94 % (06/22 0750) Weight:  [79.4 kg] 79.4 kg (06/22 0330)  GENERAL: Awake, alert, in no acute distress Psych: Affect appropriate for situation, patient is calm and cooperative with examination Head: Normocephalic and atraumatic, without obvious abnormality EENT: Normal conjunctivae, dry mucous membranes, no OP obstruction LUNGS: Normal respiratory effort. Non-labored breathing on room air CV: Regular rate and rhythm on telemetry ABDOMEN: Soft, non-tender, non-distended Extremities: Warm, well perfused, without obvious deformity.  Patient's left hand with diffuse erythema and scaling.  NEURO:  Mental Status: Awake, alert, and oriented to person, place, time, and situation. She is able to provide a clear and coherent history of present  illness. Speech/Language: speech is intact with some subtle slurring.  Patient states that she has mental clouding as well. Naming, repetition, fluency, and comprehension intact without aphasia No neglect is noted Cranial Nerves:  II: PERRL. visual fields full.  III, IV, VI: EOMI without ptosis, nystagmus, diplopia, gaze preference. V: Sensation is intact to light touch and symmetrical to face. VII: Face is symmetric resting and with movement VIII: Hearing is intact to voice IX, X: Palate elevation is symmetric. Phonation normal.  XI: Normal sternocleidomastoid and trapezius muscle strength XII: Tongue protrudes midline without fasciculations.   Motor: Right upper extremity weakness is noted with mild right upper extremity drift. Biceps and triceps 4-/5 on the right, 5/5 on the left.  Right lower extremity with mild weakness, less elevation compared to the left with minimal wobbling and vertical drift with sustained antigravity position.  Left lower extremity elevates antigravity without vertical drift. Tone is normal. Bulk is normal.  Sensation: Patient reports decreased sensation to light touch on the left upper extremity compared to the right upper extremity though patient reports decreased sensation to cold temperature to the right upper extremity compared to the left.  Patient also reports decreased sensation to temperature and light touch on the left lower extremity compared to the right. Coordination: FTN intact bilaterally. HKS intact bilaterally.  Gait: Deferred for patient safety  NIHSS: 1a Level of Conscious.: 0 1b LOC Questions: 0 1c LOC Commands: 0 2 Best Gaze: 0 3 Visual: 0 4 Facial Palsy: 0 5a Motor Arm - left: 0 5b Motor Arm - Right: 1 6a Motor Leg - Left: 0 6b Motor Leg - Right: 1 7 Limb Ataxia: 0 8 Sensory: 1 9 Best Language: 0 10 Dysarthria: 1 11 Extinct. and Inatten.: 0 TOTAL: 4  Labs I have reviewed labs in epic and the results pertinent to this  consultation are: CBC    Component Value Date/Time   WBC 6.8 03/03/2023 0336   RBC 4.27 03/03/2023 0336   HGB 12.6 03/03/2023 0346   HGB 11.9 01/02/2020 1602   HCT 37.0 03/03/2023 0346   HCT 36.4 01/02/2020 1602   PLT 269 03/03/2023 0336   PLT 370 01/02/2020 1602   MCV  89.5 03/03/2023 0336   MCV 90 01/02/2020 1602   MCH 28.1 03/03/2023 0336   MCHC 31.4 03/03/2023 0336   RDW 13.9 03/03/2023 0336   RDW 14.0 01/02/2020 1602   LYMPHSABS 2.0 03/03/2023 0336   LYMPHSABS 2.7 01/02/2020 1602   MONOABS 0.7 03/03/2023 0336   EOSABS 0.3 03/03/2023 0336   EOSABS 0.1 01/02/2020 1602   BASOSABS 0.0 03/03/2023 0336   BASOSABS 0.0 01/02/2020 1602   CMP     Component Value Date/Time   NA 138 03/03/2023 0346   NA 135 01/02/2020 1602   K 3.6 03/03/2023 0346   CL 103 03/03/2023 0346   CO2 26 03/03/2023 0336   GLUCOSE 90 03/03/2023 0346   BUN 25 (H) 03/03/2023 0346   BUN 17 01/02/2020 1602   CREATININE 1.30 (H) 03/03/2023 0346   CREATININE 0.77 07/28/2015 1805   CALCIUM 9.1 03/03/2023 0336   PROT 6.5 03/03/2023 0336   PROT 7.9 01/02/2020 1602   ALBUMIN 3.0 (L) 03/03/2023 0336   ALBUMIN 4.2 01/02/2020 1602   AST 15 03/03/2023 0336   ALT 12 03/03/2023 0336   ALKPHOS 71 03/03/2023 0336   BILITOT 0.4 03/03/2023 0336   BILITOT 0.2 01/02/2020 1602   GFRNONAA 42 (L) 03/03/2023 0336   GFRAA 58 (L) 01/02/2020 1602   Lipid Panel     Component Value Date/Time   CHOL 171 03/03/2023 0336   TRIG 103 03/03/2023 0336   HDL 54 03/03/2023 0336   CHOLHDL 3.2 03/03/2023 0336   VLDL 21 03/03/2023 0336   LDLCALC 96 03/03/2023 0336   LDLDIRECT 135.6 08/15/2010 1404   Lab Results  Component Value Date   HGBA1C 5.9 (H) 03/03/2023   Imaging I have reviewed the images obtained:  CT angio head and neck 03/03/23: 1. No emergent finding. 2. Atherosclerosis with high-grade narrowing at the origin of the non dominant left vertebral artery. 3. Advanced chronic small vessel ischemia. 4. Small and  non worrisome meningioma at the left vertex.  MRI examination of the brain 03/03/23: 1. Acute lacunar infarct in the left corona radiata. 2. Background of extensive chronic small vessel ischemia. 3. 13 mm meningioma at the left vertex.  Assessment: 77 year old female with PMHx of HTN, hypothyroidism, meningioma, PAD, CKD IIIb, psoriasis, depression, bipolar disorder, tobacco smoker, and history of CVA who presented to the ED on 03/03/2023 for evaluation of right-sided weakness and chest pain.  MRI imaging revealed an acute lacunar infarct of the left corona radiata and patient was admitted for further neurology evaluation and work up.  Stroke risk factors include history of CVA, noncompliance with stroke prophylaxis, long-term tobacco use, and PAD.   Impression: Acute lacunar infarction of the left corona radiata  Recommendations: - Hemoglobin A1c and lipid panel pending  - Frequent neuro checks - Echocardiogram - Prophylactic therapy-Antiplatelet med: Aspirin - dose 325mg  PO or 300mg  PR followed by ASA 81 mg PO daily in addition to clopidogrel loading dose of 300 mg PO followed by 75 mg PO daily for 21 days. After 3 weeks of DAPT, will transition to ASA monotherapy as patient was not taking ASA prior to her most recent CVA.  - Risk factor modification, patient endorses willingness to stop smoking  - Telemetry monitoring - PT consult, OT consult, Speech consult - Stroke team to follow  Pt seen by NP/Neuro and later by MD. Note/plan to be edited by MD as needed.  Lanae Boast, AGAC-NP [facility] Pager: [phone]  NEUROHOSPITALIST ADDENDUM Performed a face to face  diagnostic evaluation.   I have reviewed the contents of history and physical exam as documented by PA/ARNP/Resident and agree with above documentation.  I have discussed and formulated the above plan as documented. Edits to the note have been made as needed.   Erick Blinks, MD [facility] [phone]   If 7pm to 7am, please call on call as listed on AMION.

## 2023-03-03 NOTE — Plan of Care (Signed)
  Problem: Education: Goal: Knowledge of disease or condition will improve Outcome: Progressing Goal: Knowledge of secondary prevention will improve (MUST DOCUMENT ALL) Outcome: Progressing Goal: Knowledge of patient specific risk factors will improve [name] N/A or DELETE if not current risk factor) Outcome: Progressing   Problem: Ischemic Stroke/TIA Tissue Perfusion: Goal: Complications of ischemic stroke/TIA will be minimized Outcome: Progressing   Problem: Health Behavior/Discharge Planning: Goal: Ability to manage health-related needs will improve Outcome: Progressing

## 2023-03-03 NOTE — ED Triage Notes (Addendum)
Pt BIB [facility] from home alone. Pt woke up 0100 noticed R side weakness with arm and leg. LSN 2100 03/02/23. Pt states she stopped taking all meds recently ( few months) by choice. Pt also c/o chest pain/pressure, and new rash all over body. Small red bumps/patches seen on pts hands and chest. Hx:TIA

## 2023-03-04 ENCOUNTER — Inpatient Hospital Stay (HOSPITAL_COMMUNITY): Payer: Medicare Other

## 2023-03-04 ENCOUNTER — Encounter (HOSPITAL_COMMUNITY): Payer: Self-pay

## 2023-03-04 DIAGNOSIS — I63312 Cerebral infarction due to thrombosis of left middle cerebral artery: Secondary | ICD-10-CM

## 2023-03-04 DIAGNOSIS — I2089 Other forms of angina pectoris: Secondary | ICD-10-CM

## 2023-03-04 DIAGNOSIS — I639 Cerebral infarction, unspecified: Secondary | ICD-10-CM

## 2023-03-04 DIAGNOSIS — N1832 Chronic kidney disease, stage 3b: Secondary | ICD-10-CM

## 2023-03-04 DIAGNOSIS — R7989 Other specified abnormal findings of blood chemistry: Secondary | ICD-10-CM | POA: Diagnosis not present

## 2023-03-04 DIAGNOSIS — I160 Hypertensive urgency: Secondary | ICD-10-CM | POA: Diagnosis not present

## 2023-03-04 LAB — BASIC METABOLIC PANEL
Anion gap: 8 (ref 5–15)
BUN: 14 mg/dL (ref 8–23)
CO2: 24 mmol/L (ref 22–32)
Calcium: 8.7 mg/dL — ABNORMAL LOW (ref 8.9–10.3)
Chloride: 104 mmol/L (ref 98–111)
Creatinine, Ser: 1.07 mg/dL — ABNORMAL HIGH (ref 0.44–1.00)
GFR, Estimated: 53 mL/min — ABNORMAL LOW (ref >60)
Glucose, Bld: 109 mg/dL — ABNORMAL HIGH (ref 70–99)
Potassium: 3.5 mmol/L (ref 3.5–5.1)
Sodium: 136 mmol/L (ref 135–145)

## 2023-03-04 LAB — CBC
HCT: 39.6 % (ref 36.0–46.0)
Hemoglobin: 13.1 g/dL (ref 12.0–15.0)
MCH: 29.5 pg (ref 26.0–34.0)
MCHC: 33.1 g/dL (ref 30.0–36.0)
MCV: 89.2 fL (ref 80.0–100.0)
Platelets: 260 10*3/uL (ref 150–400)
RBC: 4.44 MIL/uL (ref 3.87–5.11)
RDW: 13.9 % (ref 11.5–15.5)
WBC: 7 10*3/uL (ref 4.0–10.5)
nRBC: 0 % (ref 0.0–0.2)

## 2023-03-04 MED ORDER — LOSARTAN POTASSIUM 25 MG PO TABS
25.0000 mg | ORAL_TABLET | Freq: Every day | ORAL | Status: DC
  Administered 2023-03-04 – 2023-03-07 (×4): 25 mg via ORAL
  Filled 2023-03-04 (×4): qty 1

## 2023-03-04 MED ORDER — ROPINIROLE HCL 1 MG PO TABS
2.0000 mg | ORAL_TABLET | Freq: Every evening | ORAL | Status: DC | PRN
  Administered 2023-03-04 – 2023-03-06 (×4): 2 mg via ORAL
  Filled 2023-03-04 (×4): qty 2

## 2023-03-04 MED ORDER — ONDANSETRON HCL 4 MG/2ML IJ SOLN
4.0000 mg | Freq: Four times a day (QID) | INTRAMUSCULAR | Status: DC | PRN
  Administered 2023-03-04 – 2023-03-06 (×4): 4 mg via INTRAVENOUS
  Filled 2023-03-04 (×4): qty 2

## 2023-03-04 MED ORDER — AMLODIPINE BESYLATE 5 MG PO TABS
5.0000 mg | ORAL_TABLET | Freq: Every day | ORAL | Status: DC
  Administered 2023-03-04 – 2023-03-07 (×4): 5 mg via ORAL
  Filled 2023-03-04 (×4): qty 1

## 2023-03-04 MED ORDER — NICOTINE POLACRILEX 2 MG MT GUM
2.0000 mg | CHEWING_GUM | OROMUCOSAL | Status: DC | PRN
  Filled 2023-03-04: qty 1

## 2023-03-04 NOTE — Evaluation (Signed)
Physical Therapy Evaluation Patient Details Name: [name] MRN: [id number] DOB: March 03, 1946 Today's Date: 03/04/2023  History of Present Illness  Pt is a 77 y.o. female who presented 03/03/23 with R-sided weakness and chest pain. MRI revealed acute lacunar infarct in the left corona radiata and 13 mm meningioma at the left vertex. 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 PTA, she was mod I utilizing a rollator for functional mobility, living alone in a 2-level house with 3 STE. She stays on the main level of the house. She hires someone to do household chores and cooking and her brother-in-law does the driving and shopping. She is currently displaying deficits in bil lower extremity strength (R weaker than L), L lower extremity sensation, balance, and activity tolerance and is at high risk for falls. She required min-modA for bed mobility and modA for transfers to stand and step bed <> bedside commode with a RW for support. Did not feel comfortable attempting to ambulate further without +2 this date due to pt needing assist to manage her RW and assist her with balancing due to her R knee buckling. As pt has limited assistance available to her at d/c and reports "it is time to find a nursing facility for long-term care"  she could benefit from inpatient rehab, <3 hours/day. Will continue to follow acutely.     Recommendations for follow up therapy are one component of a multi-disciplinary discharge planning process, led by the attending physician.  Recommendations may be updated based on patient status, additional functional criteria and insurance authorization.  Follow Up  Recommendations Can patient physically be transported by private vehicle: No     Assistance Recommended at Discharge Intermittent Supervision/Assistance  Patient can return home with the following  Two people to help with walking and/or transfers;A lot of help with bathing/dressing/bathroom;Assistance with cooking/housework;Assist for transportation;Help with stairs or ramp for entrance    Equipment Recommendations Other (comment) (defer to next venue of care)  Recommendations for Other Services       Functional Status Assessment Patient has had a recent decline in their functional status and demonstrates the ability to make significant improvements in function in a reasonable and predictable amount of time.     Precautions / Restrictions Precautions Precautions: Fall Restrictions Weight Bearing Restrictions: No      Mobility  Bed Mobility Overal bed mobility: Needs Assistance Bed Mobility: Supine to Sit, Sit to Supine     Supine to sit: Min assist, HOB elevated Sit to supine: Mod assist, HOB elevated   General bed mobility comments: MinA to manage R leg off EOB and ascend trunk, cuing pt to keep R UE to her chest for protection and use L bed rail with L UE to pull on to get onto L elbow to then push up to sit. ModA to manage legs back up onto bed for return to supine.    Transfers Overall transfer level: Needs assistance Equipment used: Rolling walker (2 wheels) Transfers: Sit to/from Stand, Bed to chair/wheelchair/BSC Sit to Stand: Mod assist   Step pivot transfers: Mod assist       General transfer comment: Pt cued for L hand to push up from sitting surface and hand-over-hand assistance to place and keep R  hand on RW. ModA to power up to stand, provide tactile cues and intermittent R knee block, and gain balance with each sit <> stand rep, x2 from EOB and x1 from commode. ModA for balance, intermittent R knee block, and RW management to step pivot bed <> bedside  commode. MinA to scoot laterally along EOB with cues for feet and hand placement    Ambulation/Gait Ambulation/Gait assistance: Mod assist Gait Distance (Feet): 1 Feet Assistive device: Rolling walker (2 wheels) Gait Pattern/deviations: Step-to pattern, Decreased step length - right, Decreased stride length, Decreased dorsiflexion - right, Knees buckling, Trunk flexed, Shuffle Gait velocity: reduced Gait velocity interpretation: <1.31 ft/sec, indicative of household ambulator   General Gait Details: Pt taking, slow, small, shuffling ,unsteady steps bed <> bedside commode with R hand needing support to stay on the RW. Intermittent R knee block due to buckling noted. Needs assistance for RW management, balance, and R foot advancement.  Stairs            Wheelchair Mobility    Modified Rankin (Stroke Patients Only) Modified Rankin (Stroke Patients Only) Pre-Morbid Rankin Score: Moderate disability Modified Rankin: Moderately severe disability     Balance Overall balance assessment: Needs assistance Sitting-balance support: No upper extremity supported, Feet supported Sitting balance-Leahy Scale: Fair     Standing balance support: Bilateral upper extremity supported, During functional activity, Reliant on assistive device for balance Standing balance-Leahy Scale: Poor Standing balance comment: Reliant on RW and external physical assistance                             Pertinent Vitals/Pain Pain Assessment Pain Assessment: No/denies pain    Home Living Family/patient expects to be discharged to:: Private residence Living Arrangements: Alone Available Help at Discharge: Family;Available 24 hours/day Type of Home: House Home Access: Stairs to enter Entrance Stairs-Rails: None Entrance Stairs-Number of Steps: 3   Home Layout: Two level;Able to live on main level with bedroom/bathroom Home Equipment: Rollator (4 wheels);Shower seat      Prior Function Prior  Level of Function : Needs assist             Mobility Comments: Uses rollator mod I ADLs Comments: Hires someone to do household chores and cooking; brother-in-law does driving and shopping; mod I for ADLs     Hand Dominance        Extremity/Trunk Assessment   Upper Extremity Assessment Upper Extremity Assessment: Defer to OT evaluation    Lower Extremity Assessment Lower Extremity Assessment: RLE deficits/detail;LLE deficits/detail RLE Deficits / Details: MMT scores of 4- hip flexion, 4- quads, 3- ankle dorsiflexion; denied numbness/tingling; incoordination noted RLE Sensation: WNL RLE Coordination: decreased fine motor;decreased gross motor LLE Deficits / Details: MMT scores of 4 hip flexion, 4- quads, 4+ ankle dorsiflexion; reports slight numbness in L leg LLE Sensation: decreased light touch       Communication   Communication: No difficulties  Cognition Arousal/Alertness: Awake/alert Behavior During Therapy: WFL for tasks assessed/performed Overall Cognitive Status: No family/caregiver present to determine baseline cognitive functioning                                 General Comments: Difficulty sequencing at times.        General Comments      Exercises     Assessment/Plan    PT Assessment Patient needs continued PT services  PT Problem  List Decreased strength;Decreased activity tolerance;Decreased balance;Decreased mobility;Decreased coordination;Impaired sensation       PT Treatment Interventions DME instruction;Gait training;Functional mobility training;Therapeutic activities;Therapeutic exercise;Balance training;Neuromuscular re-education;Patient/family education    PT Goals (Current goals can be found in the Care Plan section)  Acute Rehab PT Goals Patient Stated Goal: to improve and find a nursing home PT Goal Formulation: With patient Time For Goal Achievement: 03/18/23 Potential to Achieve Goals: Good    Frequency Min  3X/week     Co-evaluation               AM-PAC PT "6 Clicks" Mobility  Outcome Measure Help needed turning from your back to your side while in a flat bed without using bedrails?: A Little Help needed moving from lying on your back to sitting on the side of a flat bed without using bedrails?: A Little Help needed moving to and from a bed to a chair (including a wheelchair)?: A Lot Help needed standing up from a chair using your arms (e.g., wheelchair or bedside chair)?: A Lot Help needed to walk in hospital room?: Total Help needed climbing 3-5 steps with a railing? : Total 6 Click Score: 12    End of Session Equipment Utilized During Treatment: Gait belt Activity Tolerance: Patient tolerated treatment well Patient left: in bed;with call bell/phone within reach;with bed alarm set   PT Visit Diagnosis: Unsteadiness on feet (R26.81);Other abnormalities of gait and mobility (R26.89);Muscle weakness (generalized) (M62.81);Difficulty in walking, not elsewhere classified (R26.2);Hemiplegia and hemiparesis;Other symptoms and signs involving the nervous system (R29.898) Hemiplegia - Right/Left: Right Hemiplegia - caused by: Cerebral infarction    Time: 9528-4132 PT Time Calculation (min) (ACUTE ONLY): 30 min   Charges:   PT Evaluation $PT Eval Moderate Complexity: 1 Mod PT Treatments $Therapeutic Activity: 8-22 mins        Raymond Gurney, PT, DPT Acute Rehabilitation Services  Office: [phone]   Jewel Baize 03/04/2023, 1:52 PM

## 2023-03-04 NOTE — Progress Notes (Signed)
Attempted Echo; Patient refused exam; wanted to eat first.  Dondra Prader RVT RCS

## 2023-03-04 NOTE — Progress Notes (Addendum)
STROKE TEAM PROGRESS NOTE   INTERVAL HISTORY No family at bedside.  Admitted 6/22 for stroke workup after presenting for acute onset of right arm weakness.  MRI revealed acute lacunar infarct of left corona radiata.  Patient has history of stroke and medication noncompliance with stroke prophylaxis medications particularly (aspirin).  Patient endorses "brain fog", slight weakness to right side seen on exam.    Vitals:   03/03/23 1604 03/03/23 1941 03/03/23 2343 03/04/23 0310  BP: (!) 185/103 (!) 179/84 (!) 171/77 (!) 146/82  Pulse: 69 61 77 60  Resp: 17 16 18 16   Temp: 97.9 F (36.6 C) 98 F (36.7 C) 98.6 F (37 C) 98.4 F (36.9 C)  TempSrc: Oral     SpO2: 97% 99% 95% 96%  Weight:      Height:       CBC:  Recent Labs  Lab 03/03/23 0336 03/03/23 0346 03/04/23 0607  WBC 6.8  --  7.0  NEUTROABS 3.7  --   --   HGB 12.0 12.6 13.1  HCT 38.2 37.0 39.6  MCV 89.5  --  89.2  PLT 269  --  260   Basic Metabolic Panel:  Recent Labs  Lab 03/03/23 0336 03/03/23 0346  NA 138 138  K 3.5 3.6  CL 101 103  CO2 26  --   GLUCOSE 98 90  BUN 22 25*  CREATININE 1.30* 1.30*  CALCIUM 9.1  --    Lipid Panel:  Recent Labs  Lab 03/03/23 0336  CHOL 171  TRIG 103  HDL 54  CHOLHDL 3.2  VLDL 21  LDLCALC 96   HgbA1c:  Recent Labs  Lab 03/03/23 0336  HGBA1C 5.9*   Urine Drug Screen:  Recent Labs  Lab 03/03/23 0543  LABOPIA NONE DETECTED  COCAINSCRNUR NONE DETECTED  LABBENZ NONE DETECTED  AMPHETMU NONE DETECTED  THCU NONE DETECTED  LABBARB NONE DETECTED    Alcohol Level  Recent Labs  Lab 03/03/23 0336  ETH <10    IMAGING past 24 hours No results found.  PHYSICAL EXAM   Temp:  [97.8 F (36.6 C)-98.6 F (37 C)] 98.4 F (36.9 C) (06/23 0310) Pulse Rate:  [60-77] 60 (06/23 0310) Resp:  [16-21] 16 (06/23 0310) BP: (146-191)/(77-103) 146/82 (06/23 0310) SpO2:  [94 %-99 %] 96 % (06/23 0310)  GENERAL: Awake, alert, in no acute distress Psych: Affect  appropriate for situation, patient is calm and cooperative with examination Head: Normocephalic and atraumatic, without obvious abnormality EENT: Normal conjunctivae, dry mucous membranes, no OP obstruction LUNGS: Normal respiratory effort. Non-labored breathing on room air CV: Regular rate and rhythm on telemetry ABDOMEN: Soft, non-tender, non-distended Extremities: Warm, well perfused, without obvious deformity.  Patient's left hand with diffuse erythema and scaling.   NEURO:  Mental Status: Awake, alert, and oriented to person, place, time, and situation. She is able to provide a clear and coherent history of present illness. Speech/Language: speech is intact.  Patient states that she has "brain fog". Naming, repetition, fluency, and comprehension intact without aphasia No neglect is noted Cranial Nerves:  II: PERRL. visual fields full.  III, IV, VI: EOMI without ptosis, nystagmus, diplopia, gaze preference. V: Sensation is intact to light touch and symmetrical to face. VII: Face is symmetric resting and with movement VIII: Hearing is intact to voice IX, X: Palate elevation is symmetric. Phonation normal.  XI: Normal sternocleidomastoid and trapezius muscle strength XII: Tongue protrudes midline without fasciculations.   Motor: Right upper extremity weakness is noted with mild  right upper extremity drift.   Right lower extremity with mild weakness, compared to lle.  Tone is normal. Bulk is normal.  Sensation: Intact and symmetric throughout. Coordination: FTN intact bilaterally. HKS intact bilaterally.  Gait: Deferred for patient safety   NIHSS: 1a Level of Conscious.: 0 1b LOC Questions: 0 1c LOC Commands: 0 2 Best Gaze: 0 3 Visual: 0 4 Facial Palsy: 0 5a Motor Arm - left: 0 5b Motor Arm - Right: 1 6a Motor Leg - Left: 0 6b Motor Leg - Right: 1 7 Limb Ataxia: 0 8 Sensory: 0 9 Best Language: 0 10 Dysarthria: 0 11 Extinct. and Inatten.: 0 TOTAL:  2  ASSESSMENT/PLAN 77 year old female with PMHx of HTN, hypothyroidism, meningioma, PAD, CKD IIIb, psoriasis, depression, bipolar disorder, tobacco smoker, and history of CVA who presented to the ED on 03/03/2023 for evaluation of right-sided weakness and chest pain.  MRI imaging revealed an acute lacunar infarct of the left corona radiata and patient was admitted for further neurology evaluation and work up.  Stroke risk factors include history of CVA, noncompliance with stroke prophylaxis, long-term tobacco use, and PAD.   Stroke - acute lacunar infarct of the left corona radiata, likely small vessel disease  CT head old right thalamic and bilateral BG infarcts.  Small and non worrisome meningioma at the left vertex. CTA head & neck No emergent finding. Atherosclerosis with high-grade narrowing at origin of non dominant left vertebral artery. MRI   Acute lacunar infarct in the left corona radiata. Background of extensive chronic small vessel ischemia. 13 mm meningioma at the left vertex. 2D Echo: pending LDL 96 HgbA1c 5.9 VTE prophylaxis - lovenox No antithrombotic prior to admission, now on aspirin 81 mg daily and clopidogrel 75 mg daily DAPT for 3 weeks and then aspirin alone. Therapy recommendations:  SNF Disposition: Pending  History of stroke 10/2017 admitted for left CR infarct, CTA head and neck showed atherosclerosis bilateral carotid siphon and bulb.  EF 65 to 70%.  LDL 104, A1c 5.8, UDS negative.  Discharged on aspirin 325 and Lipitor.  Hypertension Home meds: Norvasc 5 mg, losartan 25 mg BP on the high side Resume home meds Long-term BP goal normotensive. Avoid hypotension.   Hyperlipidemia Home meds: None LDL 96, goal < 70 Agree with Lipitor 40 mg Continue statin at discharge  Tobacco abuse Current smoker Smoking cessation counseling provided Nicotine patch provided Pt is willing to quit  Other Stroke Risk Factors Advanced Age >/= 39  PAD Coronary artery  disease  Other Active Problems Psoriasis PVD Stable left frontal meningioma Bipolar disorder  Hospital day # 1   Pt seen by Neuro NP/APP and later by MD. Note/plan to be edited by MD as needed.    Lynnae January, DNP, AGACNP-BC [facility] Please use AMION for contact information & EPIC for messaging.  ATTENDING NOTE: I reviewed above note and agree with the assessment and plan. Pt was seen and examined.   Son and daughter are at bedside.  Patient lying bed, awake alert, orientated x 3, no aphasia, fluent language, no dysarthria, follows simple commands.  Able to name and repeat.  No gaze palsy, visual field full, slight right facial droop.  Right upper extremity proximal 3/5, bicep and tricep 4 -/5, finger grip 3 -/5.  Left upper extremity 5/5.  Right lower extremity proximal 4/5, distal 4+/5.  Left lower extremity proximal 4/5, distal 5/5.  Sensation mildly decreased on the right.  Right finger-to-nose dysmetria but not out of portion  to the weakness.   Patient current stroke is at the exact location of her stroke 5 years ago.  Still consistent with some vessel disease.  Aggressive risk factor modification.  Continue DAPT for 3 weeks and then as alone.  Continue statin.  Smoking cessation education provided.  PT and OT recommend SNF.  For detailed assessment and plan, please refer to above/below as I have made changes wherever appropriate.   Neurology will sign off. Please call with questions. Pt will follow up with stroke clinic NP at [facility] in about 4 weeks. Thanks for the consult.   Marvel Plan, MD PhD Stroke Neurology 03/04/2023 3:47 PM    To contact Stroke Continuity provider, please refer to [email or web address]. After hours, contact General Neurology

## 2023-03-04 NOTE — Plan of Care (Signed)
  Problem: Ischemic Stroke/TIA Tissue Perfusion: Goal: Complications of ischemic stroke/TIA will be minimized Outcome: Progressing   Problem: Coping: Goal: Will verbalize positive feelings about self Outcome: Progressing Goal: Will identify appropriate support needs Outcome: Progressing   Problem: Health Behavior/Discharge Planning: Goal: Ability to manage health-related needs will improve Outcome: Progressing

## 2023-03-04 NOTE — Progress Notes (Signed)
[facility]                                                                              [name], is a 77 y.o. female, DOB - Aug 02, 1946, [id number] Admit date - 03/03/2023    Outpatient Primary MD for the patient is Shade Flood, MD  LOS - 1  days  Chief Complaint  Patient presents with   Weakness       Brief summary   Patient is a 77 year old female with hypertension, idiopathic pulmonary fibrosis, atheroembolic previously on [facility], prior CVA in 2019, hypothyroidism meningioma, PVD status post posterior left femoral-popliteal bypass in 2021, CKD stage IIIb, psoriasis, depression, bipolar disorder, tobacco use presented with complaints of right-sided weakness.  She had fallen asleep on the couch a day before the admission around 6 PM and woke up at 9 PM with chest pain and pressure.  Symptoms resolved and she fell back asleep and then woke up around 1 AM and noted that she was unable to move her right hand or leg.  At baseline, able to ambulate with use of rollator. In ED, BP elevated to 209/95, temp 98.1 F, RR 13, HR 16 Creatinine 1.3 MRI brain revealed acute lacunar infarct of the left corona radiator in the background of extensive chronic small vessel ischemia. Neurology consulted.  Assessment & Plan    Principal Problem: Acute CVA (cerebral vascular accident) (HCC), prior history of CVA in 2019 -Presented with right arm and leg weakness, MRI revealed acute lacunar infarct of the left corona radiator -CTA head and neck showed no emergent finding, atherosclerosis with high-grade narrowing at the origin of nondominant left vertebral artery, advanced chronic small vessel ischemia -Neurology consulted, recommended aspirin and Plavix for 21 days and then transition to aspirin alone -Follow PT OT, 2D echo, SLP -Lives alone, may need rehab -Lipid panel showed HDL 54, LDL 96, triglycerides 103. -Hemoglobin A1c 5.9  Active problems Hypertensive  urgency Initial blood pressures elevated up to 209/95.   -Patient had been recently seen by cardiology and started on amlodipine, losartan however did not report taking medications regularly.   -Allow permissive hypertension at this time   Atypical chest pain -Reported waking up around 9 PM the night before the admission with chest pain, currently resolved. -Follow-up 2D echo   Chronic kidney disease stage IIIb Creatinine 1.3 which appears around patient's baseline. -Creatinine improved to 1.0, continue to monitor   Hyperlipidemia -Lipid panel showed HDL 54, LDL 96, triglycerides 103. -Continue Lipitor 40 g daily    Psoriasis Patient with psoriatic rash of the hands upper and lower extremities.  Denied taking anything for treatment. -Clobetasol ointment to affected areas, outpatient follow-up with dermatology   Peripheral vascular disease Patient noted have a prior history of atheroembolic disease for which she had been on Xarelto in the past but stopped several years ago.  Not on any medications for treatment. -Continue aspirin, Plavix, and statin   ?hypothyroidism -Not on any medication for hypothyroidism, TSH 4.7     Major depressive disorder Anxiety Patient appears to be followed in the outpatient setting by behavioral health, but reported not taking  medication of Lamictal and Cymbalta.   Idiopathic pulmonary fibrosis -Stable, O2 sats 95% on room air   Meningioma CT imaging noted small nonworrisome meningioma of the left vertex which was noted to be approximately 13 mm on MRI.   Tobacco abuse Patient reports smoking a pack cigarettes per day on average. -Counseled on need of cessation of tobacco use -Nicotine patch offered   Debility Patient got around with use of a rolling walker at baseline. PT OT evaluation pending  Estimated body mass index is 27.41 kg/m as calculated from the following:   Height as of this encounter: 5\' 7"  (1.702 m).   Weight as of this  encounter: 79.4 kg.  Code Status: Full code DVT Prophylaxis:  enoxaparin (LOVENOX) injection 40 mg Start: 03/03/23 2200   Level of Care: Level of care: Telemetry Medical Family Communication: Updated patient Disposition Plan:      Remains inpatient appropriate:      Procedures:    Consultants:   Neurology  Antimicrobials: None   Medications  aspirin EC  81 mg Oral Daily   atorvastatin  40 mg Oral Daily   clobetasol ointment   Topical BID   clopidogrel  75 mg Oral Daily   enoxaparin (LOVENOX) injection  40 mg Subcutaneous Q24H   nicotine  21 mg Transdermal Daily      Subjective:   [name] was seen and examined today.  BP improving, no acute complaints, still has right-sided weakness, no speech difficulty.  No nausea vomiting, chest pain, shortness of breath.    Objective:   Vitals:   03/03/23 1941 03/03/23 2343 03/04/23 0310 03/04/23 0745  BP: (!) 179/84 (!) 171/77 (!) 146/82 (!) 180/102  Pulse: 61 77 60 (!) 58  Resp: 16 18 16    Temp: 98 F (36.7 C) 98.6 F (37 C) 98.4 F (36.9 C) 97.9 F (36.6 C)  TempSrc:    Oral  SpO2: 99% 95% 96% 99%  Weight:      Height:        Intake/Output Summary (Last 24 hours) at 03/04/2023 1131 Last data filed at 03/03/2023 1747 Gross per 24 hour  Intake 237 ml  Output --  Net 237 ml     Wt Readings from Last 3 Encounters:  03/03/23 79.4 kg  02/13/23 77.1 kg  02/07/23 77.1 kg     Exam General: Alert and oriented x 3, NAD Cardiovascular: S1 S2 auscultated,  RRR Respiratory: Fine bilateral crackles Gastrointestinal: Soft, nontender, nondistended, + bowel sounds Ext: no pedal edema bilaterally Neuro: strength 5/5 LUE, L LE, RUE 4/5, R LE 4/5 Skin: Psoriatic rashes on the hands, arms and legs, erythema and scaling Psych: Normal affect     Data Reviewed:  I have personally reviewed following labs    CBC Lab Results  Component Value Date   WBC 7.0 03/04/2023   RBC 4.44 03/04/2023   HGB 13.1 03/04/2023    HCT 39.6 03/04/2023   MCV 89.2 03/04/2023   MCH 29.5 03/04/2023   PLT 260 03/04/2023   MCHC 33.1 03/04/2023   RDW 13.9 03/04/2023   LYMPHSABS 2.0 03/03/2023   MONOABS 0.7 03/03/2023   EOSABS 0.3 03/03/2023   BASOSABS 0.0 03/03/2023     Last metabolic panel Lab Results  Component Value Date   NA 136 03/04/2023   K 3.5 03/04/2023   CL 104 03/04/2023   CO2 24 03/04/2023   BUN 14 03/04/2023   CREATININE 1.07 (H) 03/04/2023   GLUCOSE 109 (H) 03/04/2023  GFRNONAA 53 (L) 03/04/2023   GFRAA 58 (L) 01/02/2020   CALCIUM 8.7 (L) 03/04/2023   PHOS 4.0 11/20/2019   PROT 6.5 03/03/2023   ALBUMIN 3.0 (L) 03/03/2023   LABGLOB 3.7 01/02/2020   AGRATIO 1.1 (L) 01/02/2020   BILITOT 0.4 03/03/2023   ALKPHOS 71 03/03/2023   AST 15 03/03/2023   ALT 12 03/03/2023   ANIONGAP 8 03/04/2023    CBG (last 3)  No results for input(s): "GLUCAP" in the last 72 hours.    Coagulation Profile: Recent Labs  Lab 03/03/23 0336  INR 1.1     Radiology Studies: I have personally reviewed the imaging studies  MR BRAIN WO CONTRAST  Result Date: 03/03/2023 CLINICAL DATA:  Stroke follow-up.  Right arm and leg weakness. EXAM: MRI HEAD WITHOUT CONTRAST TECHNIQUE: Multiplanar, multiecho pulse sequences of the brain and surrounding structures were obtained without intravenous contrast. COMPARISON:  Head CT and CTA from earlier today FINDINGS: Brain: 1 cm restricted diffusion in the left corona radiata. There is a background of extensive chronic small vessel ischemia in the cerebral white matter with chronic right thalamic infarct and small bilateral cerebellar infarcts. Meningioma at the left vertex measuring 13 mm. Node significant mass effect on the brain. No hydrocephalus or collection. Vascular: Normal flow voids Skull and upper cervical spine: Normal marrow signal Sinuses/Orbits: Negative IMPRESSION: 1. Acute lacunar infarct in the left corona radiata. 2. Background of extensive chronic small vessel  ischemia. 3. 13 mm meningioma at the left vertex. Electronically Signed   By: Tiburcio Pea M.D.   On: 03/03/2023 06:53   CT ANGIO HEAD NECK W WO CM  Result Date: 03/03/2023 CLINICAL DATA:  Stroke follow-up. Right-sided weakness involving the arm and leg EXAM: CT ANGIOGRAPHY HEAD AND NECK WITH AND WITHOUT CONTRAST TECHNIQUE: Multidetector CT imaging of the head and neck was performed using the standard protocol during bolus administration of intravenous contrast. Multiplanar CT image reconstructions and MIPs were obtained to evaluate the vascular anatomy. Carotid stenosis measurements (when applicable) are obtained utilizing NASCET criteria, using the distal internal carotid diameter as the denominator. RADIATION DOSE REDUCTION: This exam was performed according to the departmental dose-optimization program which includes automated exposure control, adjustment of the mA and/or kV according to patient size and/or use of iterative reconstruction technique. CONTRAST:  75mL OMNIPAQUE IOHEXOL 350 MG/ML SOLN COMPARISON:  10/05/2022 head CT FINDINGS: CT HEAD FINDINGS Brain: Extensive chronic small vessel ischemia in the cerebral white matter with confluent low-density. Chronic right thalamic infarct. Partially calcified meningioma at the left vertex measuring 12 mm. Vascular: No hyperdense vessel or unexpected calcification. Skull: Normal. Negative for fracture or focal lesion. Sinuses/Orbits: No acute finding. Review of the MIP images confirms the above findings CTA NECK FINDINGS Aortic arch: Atheromatous plaque with 2 vessel branching. No acute finding or dilatation Right carotid system: Diffuse atheromatous plaque, primarily calcified. No ulceration, beading, or flow limiting stenosis. Left carotid system: Diffuse atheromatous plaque, primarily calcified. No ulceration, beading, or flow limiting stenosis. Vertebral arteries: No proximal subclavian flow limiting stenosis, although there is atheromatous plaque on  both sides. Right dominant vertebral artery. Vertebral tortuosity on both sides, plaque causes high-grade narrowing at the left vertebral origin based on coronal reformats, patent lumen not accurately measurable due to degree of plaque and small vessel size. Skeleton: Degenerative endplate and facet spurring. Other neck: Negative Upper chest: Biapical emphysema Review of the MIP images confirms the above findings CTA HEAD FINDINGS Anterior circulation: Atheromatous calcification especially at the carotid siphons.  No significant stenosis, proximal occlusion, aneurysm, or vascular malformation. Posterior circulation: The vertebral and basilar arteries are smoothly contoured and widely patent. No branch occlusion, beading, or aneurysm. Venous sinuses: Diffusely patent as permitted by contrast timing Anatomic variants: Fetal type right PCA. Review of the MIP images confirms the above findings IMPRESSION: 1. No emergent finding. 2. Atherosclerosis with high-grade narrowing at the origin of the non dominant left vertebral artery. 3. Advanced chronic small vessel ischemia. 4. Small and non worrisome meningioma at the left vertex. Electronically Signed   By: Tiburcio Pea M.D.   On: 03/03/2023 04:50       Denvil Canning M.D. [facility] 03/04/2023, 11:31 AM  Available via Epic secure chat 7am-7pm After 7 pm, please refer to night coverage provider listed on amion.

## 2023-03-04 NOTE — Progress Notes (Signed)
Patient getting therapy; unable to do Echo at this time  Dondra Prader RVT RCS

## 2023-03-05 ENCOUNTER — Inpatient Hospital Stay (HOSPITAL_COMMUNITY): Payer: Medicare Other

## 2023-03-05 ENCOUNTER — Ambulatory Visit: Payer: Medicare Other

## 2023-03-05 DIAGNOSIS — N183 Chronic kidney disease, stage 3 unspecified: Secondary | ICD-10-CM

## 2023-03-05 DIAGNOSIS — I6389 Other cerebral infarction: Secondary | ICD-10-CM | POA: Diagnosis not present

## 2023-03-05 LAB — ECHOCARDIOGRAM COMPLETE
Height: 67 in
S' Lateral: 1.36 cm
Weight: 2800 oz

## 2023-03-05 MED ORDER — POLYETHYLENE GLYCOL 3350 17 G PO PACK
17.0000 g | PACK | Freq: Every day | ORAL | Status: DC | PRN
  Administered 2023-03-05: 17 g via ORAL
  Filled 2023-03-05: qty 1

## 2023-03-05 NOTE — Plan of Care (Signed)
  Problem: Education: Goal: Knowledge of disease or condition will improve Outcome: Progressing Goal: Knowledge of secondary prevention will improve (MUST DOCUMENT ALL) Outcome: Progressing Goal: Knowledge of patient specific risk factors will improve (Mark N/A or DELETE if not current risk factor) Outcome: Progressing   Problem: Ischemic Stroke/TIA Tissue Perfusion: Goal: Complications of ischemic stroke/TIA will be minimized Outcome: Progressing   Problem: Coping: Goal: Will verbalize positive feelings about self Outcome: Progressing Goal: Will identify appropriate support needs Outcome: Progressing   Problem: Health Behavior/Discharge Planning: Goal: Ability to manage health-related needs will improve Outcome: Progressing Goal: Goals will be collaboratively established with patient/family Outcome: Progressing   Problem: Self-Care: Goal: Ability to participate in self-care as condition permits will improve Outcome: Progressing Goal: Verbalization of feelings and concerns over difficulty with self-care will improve Outcome: Progressing Goal: Ability to communicate needs accurately will improve Outcome: Progressing   Problem: Nutrition: Goal: Risk of aspiration will decrease Outcome: Progressing Goal: Dietary intake will improve Outcome: Progressing   Problem: Education: Goal: Knowledge of disease or condition will improve Outcome: Progressing Goal: Knowledge of secondary prevention will improve (MUST DOCUMENT ALL) Outcome: Progressing Goal: Knowledge of patient specific risk factors will improve (Mark N/A or DELETE if not current risk factor) Outcome: Progressing   Problem: Ischemic Stroke/TIA Tissue Perfusion: Goal: Complications of ischemic stroke/TIA will be minimized Outcome: Progressing   Problem: Coping: Goal: Will verbalize positive feelings about self Outcome: Progressing Goal: Will identify appropriate support needs Outcome: Progressing   Problem:  Health Behavior/Discharge Planning: Goal: Ability to manage health-related needs will improve Outcome: Progressing Goal: Goals will be collaboratively established with patient/family Outcome: Progressing   Problem: Self-Care: Goal: Ability to participate in self-care as condition permits will improve Outcome: Progressing Goal: Verbalization of feelings and concerns over difficulty with self-care will improve Outcome: Progressing Goal: Ability to communicate needs accurately will improve Outcome: Progressing   Problem: Nutrition: Goal: Risk of aspiration will decrease Outcome: Progressing Goal: Dietary intake will improve Outcome: Progressing   

## 2023-03-05 NOTE — Progress Notes (Signed)
[facility]                                                                              [name], is a 77 y.o. female, DOB - 08-25-1946, JYN:[id number] Admit date - 03/03/2023    Outpatient Primary MD for the patient is Shade Flood, MD  LOS - 2  days  Chief Complaint  Patient presents with   Weakness       Brief summary   Patient is a 77 year old female with hypertension, idiopathic pulmonary fibrosis, atheroembolic previously on [facility], prior CVA in 2019, hypothyroidism meningioma, PVD status post posterior left femoral-popliteal bypass in 2021, CKD stage IIIb, psoriasis, depression, bipolar disorder, tobacco use presented with complaints of right-sided weakness.  She had fallen asleep on the couch a day before the admission around 6 PM and woke up at 9 PM with chest pain and pressure.  Symptoms resolved and she fell back asleep and then woke up around 1 AM and noted that she was unable to move her right hand or leg.  At baseline, able to ambulate with use of rollator. In ED, BP elevated to 209/95, temp 98.1 F, RR 13, HR 16 Creatinine 1.3 MRI brain revealed acute lacunar infarct of the left corona radiator in the background of extensive chronic small vessel ischemia. Neurology consulted.  Assessment & Plan    Principal Problem: Acute CVA (cerebral vascular accident) (HCC), prior history of CVA in 2019 -Presented with right arm and leg weakness, MRI revealed acute lacunar infarct of the left corona radiator -CTA head and neck showed no emergent finding, atherosclerosis with high-grade narrowing at the origin of nondominant left vertebral artery, advanced chronic small vessel ischemia -Neurology consulted, recommended aspirin and Plavix for 21 days and then transition to aspirin alone --Lives alone, PT evaluation recommended SNF  - Lipid panel showed HDL 54, LDL 96, triglycerides 103, placed on Lipitor 40 mg daily. - Hemoglobin A1c 5.9 - 2D echo showed EF of  70 to 75%, severe LVH, diastolic function could not be evaluated, RV SF normal.  Active problems Hypertensive urgency Initial blood pressures elevated up to 209/95.   -Patient had been recently seen by cardiology and started on amlodipine, losartan however did not report taking medications regularly.   -BP now stable, continue to hold antihypertensives   Atypical chest pain -Reported waking up around 9 PM the night before the admission with chest pain, resolved. -2D echo showed EF of 70 to 75%, severe LVH, no acute regional wall abnormalities   Chronic kidney disease stage IIIb Creatinine 1.3 which appears around patient's baseline. -Creatinine improved to 1.0, continue to monitor   Hyperlipidemia -Lipid panel showed HDL 54, LDL 96, triglycerides 103. -Continue Lipitor 40 g daily    Psoriasis Patient with psoriatic rash of the hands upper and lower extremities.  Denied taking anything for treatment. -Clobetasol ointment to affected areas, outpatient follow-up with dermatology   Peripheral vascular disease Patient noted have a prior history of atheroembolic disease for which she had been on Xarelto in the past but stopped several years ago.  Not on any medications for treatment. -Continue aspirin, Plavix, and  statin   ?hypothyroidism -Not on any medication for hypothyroidism, TSH 4.7     Major depressive disorder Anxiety Patient appears to be followed in the outpatient setting by behavioral health, but reported not taking medication of Lamictal and Cymbalta.   Idiopathic pulmonary fibrosis -Stable, O2 sats 95% on room air   Meningioma CT imaging noted small nonworrisome meningioma of the left vertex which was noted to be approximately 13 mm on MRI.   Tobacco abuse Patient reports smoking a pack cigarettes per day on average. -Counseled on need of cessation of tobacco use -Nicotine patch offered   Debility Patient got around with use of a rolling walker at baseline. PT  OT evaluation recommended SNF  Estimated body mass index is 27.41 kg/m as calculated from the following:   Height as of this encounter: 5\' 7"  (1.702 m).   Weight as of this encounter: 79.4 kg.  Code Status: Full code DVT Prophylaxis:  enoxaparin (LOVENOX) injection 40 mg Start: 03/03/23 2200   Level of Care: Level of care: Telemetry Medical Family Communication: Updated patient's son, Ivar Bury on the phone Disposition Plan:      Remains inpatient appropriate:  Stroke workup complete, needs SNF, lives alone   Procedures:    Consultants:   Neurology  Antimicrobials: None   Medications  amLODipine  5 mg Oral Daily   aspirin EC  81 mg Oral Daily   atorvastatin  40 mg Oral Daily   clobetasol ointment   Topical BID   clopidogrel  75 mg Oral Daily   enoxaparin (LOVENOX) injection  40 mg Subcutaneous Q24H   losartan  25 mg Oral Daily   nicotine  21 mg Transdermal Daily      Subjective:   [name] was seen and examined today.  Overall improving, weight improving, slight right-sided weakness, no speech difficulty.  No nausea vomiting chest pain or shortness of breath.    Objective:   Vitals:   03/04/23 2052 03/04/23 2338 03/05/23 0647 03/05/23 0723  BP: (!) 156/79 (!) 150/80 124/77 127/77  Pulse: 68 76  70  Resp: 16 16  16   Temp: 98.5 F (36.9 C) 97.9 F (36.6 C) 98.6 F (37 C) 98 F (36.7 C)  TempSrc: Oral Oral Oral Oral  SpO2: 96% 98% 96% 96%  Weight:      Height:       No intake or output data in the 24 hours ending 03/05/23 1051    Wt Readings from Last 3 Encounters:  03/03/23 79.4 kg  02/13/23 77.1 kg  02/07/23 77.1 kg   Physical Exam General: Alert and oriented x 3, NAD Cardiovascular: S1 S2 clear, RRR.  Respiratory: CTAB, no wheezing Gastrointestinal: Soft, nontender, nondistended, NBS Ext: no pedal edema bilaterally Neuro: mild RUE and RLE weakness compared to the left Psych: Normal affect      Data Reviewed:  I have personally  reviewed following labs    CBC Lab Results  Component Value Date   WBC 7.0 03/04/2023   RBC 4.44 03/04/2023   HGB 13.1 03/04/2023   HCT 39.6 03/04/2023   MCV 89.2 03/04/2023   MCH 29.5 03/04/2023   PLT 260 03/04/2023   MCHC 33.1 03/04/2023   RDW 13.9 03/04/2023   LYMPHSABS 2.0 03/03/2023   MONOABS 0.7 03/03/2023   EOSABS 0.3 03/03/2023   BASOSABS 0.0 03/03/2023     Last metabolic panel Lab Results  Component Value Date   NA 136 03/04/2023   K 3.5 03/04/2023   CL  104 03/04/2023   CO2 24 03/04/2023   BUN 14 03/04/2023   CREATININE 1.07 (H) 03/04/2023   GLUCOSE 109 (H) 03/04/2023   GFRNONAA 53 (L) 03/04/2023   GFRAA 58 (L) 01/02/2020   CALCIUM 8.7 (L) 03/04/2023   PHOS 4.0 11/20/2019   PROT 6.5 03/03/2023   ALBUMIN 3.0 (L) 03/03/2023   LABGLOB 3.7 01/02/2020   AGRATIO 1.1 (L) 01/02/2020   BILITOT 0.4 03/03/2023   ALKPHOS 71 03/03/2023   AST 15 03/03/2023   ALT 12 03/03/2023   ANIONGAP 8 03/04/2023    CBG (last 3)  No results for input(s): "GLUCAP" in the last 72 hours.    Coagulation Profile: Recent Labs  Lab 03/03/23 0336  INR 1.1     Radiology Studies: I have personally reviewed the imaging studies  ECHOCARDIOGRAM COMPLETE  Result Date: 03/05/2023    ECHOCARDIOGRAM REPORT   Patient Name:   CYSTAL SHANNAHAN Date of Exam: 03/05/2023 Medical Rec #:  [id number]      Height:       67.0 in Accession #:    [id number]     Weight:       175.0 lb Date of Birth:  Jan 29, 1946       BSA:          1.911 m Patient Age:    77 years       BP:           127/77 mmHg Patient Gender: F              HR:           65 bpm. Exam Location:  Inpatient Procedure: 2D Echo, Cardiac Doppler and Color Doppler Indications:    Stroke I63.9  History:        Patient has prior history of Echocardiogram examinations, most                 recent 11/18/2019. CAD, Stroke, Signs/Symptoms:Chest Pain; Risk                 Factors:Hypertension, Current Smoker and Dyslipidemia. CKD,                 stage 3.   Sonographer:    Lucendia Herrlich Referring Phys: [id number] RONDELL A SMITH  Sonographer Comments: Image acquisition challenging due to uncooperative patient. IMPRESSIONS  1. Left ventricular ejection fraction, by estimation, is 70 to 75%. The left ventricle has hyperdynamic function. The left ventricle has no regional wall motion abnormalities. There is severe left ventricular hypertrophy. Left ventricular diastolic function could not be evaluated.  2. Right ventricular systolic function is normal. The right ventricular size is normal.  3. A small pericardial effusion is present. There is no evidence of cardiac tamponade.  4. The mitral valve is grossly normal. Trivial mitral valve regurgitation.  5. The aortic valve is tricuspid. There is mild calcification of the aortic valve. There is mild thickening of the aortic valve. Aortic valve regurgitation is trivial. Aortic valve sclerosis is present, with no evidence of aortic valve stenosis. Comparison(s): No significant change from prior study. Conclusion(s)/Recommendation(s): Patient did not tolerate study--unable to get any views beyond parasternal windows. FINDINGS  Left Ventricle: Left ventricular ejection fraction, by estimation, is 70 to 75%. The left ventricle has hyperdynamic function. The left ventricle has no regional wall motion abnormalities. The left ventricular internal cavity size was small. There is severe left ventricular hypertrophy. Left ventricular diastolic function could not be evaluated. Right Ventricle: The right ventricular  size is normal. Right vetricular wall thickness was not well visualized. Right ventricular systolic function is normal. Left Atrium: Left atrial size was not well visualized. Right Atrium: Right atrial size was not well visualized. Pericardium: A small pericardial effusion is present. There is no evidence of cardiac tamponade. Mitral Valve: The mitral valve is grossly normal. Trivial mitral valve regurgitation. Tricuspid  Valve: The tricuspid valve is normal in structure. Tricuspid valve regurgitation is trivial. No evidence of tricuspid stenosis. Aortic Valve: The aortic valve is tricuspid. There is mild calcification of the aortic valve. There is mild thickening of the aortic valve. Aortic valve regurgitation is trivial. Aortic valve sclerosis is present, with no evidence of aortic valve stenosis. Pulmonic Valve: The pulmonic valve was not well visualized. Pulmonic valve regurgitation is not visualized. No evidence of pulmonic stenosis. Aorta: The aortic root and ascending aorta are structurally normal, with no evidence of dilitation. IAS/Shunts: The interatrial septum was not assessed.  LEFT VENTRICLE PLAX 2D LVIDd:         2.61 cm LVIDs:         1.36 cm LV PW:         1.60 cm LV IVS:        1.74 cm LVOT diam:     1.90 cm LVOT Area:     2.84 cm  LEFT ATRIUM         Index LA diam:    3.10 cm 1.62 cm/m   AORTA Ao Root diam: 3.30 cm Ao Asc diam:  3.50 cm TRICUSPID VALVE TR Peak grad:   13.2 mmHg TR Vmax:        182.00 cm/s  SHUNTS Systemic Diam: 1.90 cm Jodelle Red MD Electronically signed by Jodelle Red MD Signature Date/Time: 03/05/2023/10:41:07 AM    Final        Thad Ranger M.D. [facility] 03/05/2023, 10:51 AM  Available via Epic secure chat 7am-7pm After 7 pm, please refer to night coverage provider listed on amion.

## 2023-03-05 NOTE — Progress Notes (Signed)
Mobility Specialist Progress Note   03/05/23 1528  Mobility  Activity Transferred from chair to bed  Level of Assistance Moderate assist, patient does 50-74%  Assistive Device Front wheel walker  Distance Ambulated (ft) 2 ft  Activity Response Tolerated well  Mobility Referral Yes  $Mobility charge 1 Mobility  Mobility Specialist Start Time (ACUTE ONLY) 1450  Mobility Specialist Stop Time (ACUTE ONLY) 1527  Mobility Specialist Time Calculation (min) (ACUTE ONLY) 37 min   Pt in chair requesting to get back to bed d/t fatigue. ModA to stand and pivot to EOB, pt having difficulty clearing R foot and x1 bout of knee buckling in L knee but no faults present. Left in bed w/ call bell in reach and bed alarm on.   Frederico Hamman Mobility Specialist Please contact via SecureChat or  Rehab office at [phone]

## 2023-03-05 NOTE — Evaluation (Signed)
Occupational Therapy Evaluation Patient Details Name: [name] MRN: [id number] DOB: 11-May-1946 Today's Date: 03/05/2023   History of Present Illness Pt is a 77 y.o. female who presented 03/03/23 with R-sided weakness and chest pain. MRI revealed acute lacunar infarct in the left corona radiata and 13 mm meningioma at the left vertex. PMH: HTN, idiopathic pulmonary fibrosis, atheroembolic previously on anticoagulation, prior CVA in 2019, hypothyroidism, meningioma, PVD s/p posterior left femoral popliteal bypass in2021, CKD stage IIIb, psoriasis, depression, bipolar disorder, and tobacco abuse   Clinical Impression   PTA, pt lives alone, typically Modified Independent with ADLs/Rollator and has assist for IADLs. Pt presents now with deficits in R sided strength, coordination, standing balance and endurance. Pt requires Min A for bed mobility, Min A x 2 for pivots with RW and Mod A x 2 for ambulation attempts w/ assist needed to correct R sided lean. Pt requires Min A for UB ADL and Mod A for LB ADLs. Provided theraputty, squeeze ball and fine motor activities handout for pt to work on during admission. Patient will benefit from continued inpatient follow up therapy, <3 hours/day      Recommendations for follow up therapy are one component of a multi-disciplinary discharge planning process, led by the attending physician.  Recommendations may be updated based on patient status, additional functional criteria and insurance authorization.   Assistance Recommended at Discharge Frequent or constant Supervision/Assistance  Patient can return home with the following Two people to help with walking and/or transfers;A lot of help with bathing/dressing/bathroom;Assistance with cooking/housework;Assist for transportation;Help with stairs or ramp for entrance    Functional Status Assessment  Patient has had a recent decline in their functional status and demonstrates the ability to make significant  improvements in function in a reasonable and predictable amount of time.  Equipment Recommendations  Other (comment);BSC/3in1 (RW)    Recommendations for Other Services       Precautions / Restrictions Precautions Precautions: Fall Restrictions Weight Bearing Restrictions: No      Mobility Bed Mobility Overal bed mobility: Needs Assistance Bed Mobility: Supine to Sit     Supine to sit: Min assist, HOB elevated     General bed mobility comments: assist to scoot hips EOB    Transfers Overall transfer level: Needs assistance Equipment used: Rolling walker (2 wheels) Transfers: Sit to/from Stand, Bed to chair/wheelchair/BSC Sit to Stand: Min assist, +2 physical assistance, +2 safety/equipment, From elevated surface     Step pivot transfers: Min assist, +2 physical assistance, +2 safety/equipment     General transfer comment: Min A  x 2 for initial stand and pivot to recliner with RW, assist for R sided balance/holding R hand to walker. Required Mod A x 2 for mobility in room with close chair follow      Balance Overall balance assessment: Needs assistance Sitting-balance support: No upper extremity supported, Feet supported     Postural control: Right lateral lean Standing balance support: Bilateral upper extremity supported, During functional activity, Reliant on assistive device for balance Standing balance-Leahy Scale: Poor                             ADL either performed or assessed with clinical judgement   ADL Overall ADL's : Needs assistance/impaired Eating/Feeding: Set up   Grooming: Set up;Sitting;Oral care Grooming Details (indicate cue type and reason): able to brush teeth sitting in recliner. educated on strategies to use R hand to assist/hold items while  L hand assisting in manpulating Upper Body Bathing: Minimal assistance;Sitting   Lower Body Bathing: Moderate assistance;Sit to/from stand   Upper Body Dressing : Minimal  assistance;Sitting   Lower Body Dressing: Moderate assistance;Sit to/from stand;Sitting/lateral leans Lower Body Dressing Details (indicate cue type and reason): opted for slip on shoes EOB with Min A. will need increased assist in standing Toilet Transfer: Minimal assistance;+2 for physical assistance;+2 for safety/equipment;Stand-pivot;Rolling walker (2 wheels)   Toileting- Clothing Manipulation and Hygiene: Moderate assistance;Sitting/lateral lean;Sit to/from stand       Functional mobility during ADLs: Moderate assistance;+2 for physical assistance;+2 for safety/equipment;Rolling walker (2 wheels);Cueing for safety;Cueing for sequencing       Vision Ability to See in Adequate Light: 0 Adequate Patient Visual Report: No change from baseline Vision Assessment?: No apparent visual deficits     Perception     Praxis      Pertinent Vitals/Pain Pain Assessment Pain Assessment: No/denies pain     Hand Dominance Right   Extremity/Trunk Assessment Upper Extremity Assessment Upper Extremity Assessment: RUE deficits/detail RUE Deficits / Details: able to form weak grasp, opposition impaired. overall 3/5 for biceps/triceps, 3-/5 for shoulder strength ([facility]). RUE Coordination: decreased fine motor   Lower Extremity Assessment Lower Extremity Assessment: Defer to PT evaluation   Cervical / Trunk Assessment Cervical / Trunk Assessment: Normal   Communication Communication Communication: No difficulties   Cognition Arousal/Alertness: Awake/alert Behavior During Therapy: WFL for tasks assessed/performed Overall Cognitive Status: Impaired/Different from baseline Area of Impairment: Attention, Awareness, Safety/judgement                   Current Attention Level: Selective     Safety/Judgement: Decreased awareness of deficits, Decreased awareness of safety Awareness: Emergent         General Comments       Exercises     Shoulder Instructions      Home  Living Family/patient expects to be discharged to:: Private residence Living Arrangements: Alone Available Help at Discharge: Family Type of Home: House Home Access: Stairs to enter Secretary/administrator of Steps: 3 Entrance Stairs-Rails: None Home Layout: Two level;Able to live on main level with bedroom/bathroom     Bathroom Shower/Tub: Walk-in shower;Sponge bathes at baseline   Bathroom Toilet: Handicapped height     Home Equipment: Rollator (4 wheels);Shower seat          Prior Functioning/Environment Prior Level of Function : Needs assist             Mobility Comments: Uses rollator mod I ADLs Comments: Hires someone to do household chores and cooking; brother-in-law does driving and shopping; mod I for ADLs        OT Problem List: Decreased strength;Decreased activity tolerance;Impaired balance (sitting and/or standing);Decreased coordination;Decreased cognition;Decreased knowledge of use of DME or AE;Impaired UE functional use      OT Treatment/Interventions: Self-care/ADL training;Therapeutic exercise;Energy conservation;DME and/or AE instruction;Therapeutic activities;Patient/family education;Balance training    OT Goals(Current goals can be found in the care plan section) Acute Rehab OT Goals Patient Stated Goal: increase R sided strength, get some rehab OT Goal Formulation: With patient Time For Goal Achievement: 03/19/23 Potential to Achieve Goals: Good  OT Frequency: Min 2X/week    Co-evaluation              AM-PAC OT "6 Clicks" Daily Activity     Outcome Measure Help from another person eating meals?: None Help from another person taking care of personal grooming?: A Little Help from another person  toileting, which includes using toliet, bedpan, or urinal?: A Lot Help from another person bathing (including washing, rinsing, drying)?: A Lot Help from another person to put on and taking off regular upper body clothing?: A Little Help from  another person to put on and taking off regular lower body clothing?: A Lot 6 Click Score: 16   End of Session Equipment Utilized During Treatment: Gait belt;Rolling walker (2 wheels) Nurse Communication: Mobility status  Activity Tolerance: Patient tolerated treatment well Patient left: in chair;with call bell/phone within reach;with chair alarm set  OT Visit Diagnosis: Other abnormalities of gait and mobility (R26.89);Unsteadiness on feet (R26.81);Muscle weakness (generalized) (M62.81)                Time: 1610-9604 OT Time Calculation (min): 29 min Charges:  OT General Charges $OT Visit: 1 Visit OT Evaluation $OT Eval Moderate Complexity: 1 Mod OT Treatments $Self Care/Home Management : 8-22 mins  Bradd Canary, OTR/L Acute Rehab Services Office: [phone]   Lorre Munroe 03/05/2023, 12:01 PM

## 2023-03-05 NOTE — TOC Initial Note (Signed)
Transition of Care [facility]) - Initial/Assessment Note    Patient Details  Name: [name] MRN: [id number] Date of Birth: [birth year]  Transition of Care [facility]) CM/SW Contact:    Baldemar Lenis, LCSW Phone Number: 03/05/2023, 1:33 PM  Clinical Narrative:       CSW updated by medical team that patient is agreeable to SNF placement. CSW faxed out referral, then met with patient for CMS choice list and bed offers. Patient had been to [facility] before, asking to return. CSW sent referral to [facility] but they do not have beds available. CSW met with patient again to discuss, she is not familiar with other options. CSW received permission to call her son. CSW spoke with son, [name], to discuss SNF placement and provide bed offers. Son to review choices and update CSW with decision. CSW answered questions about insurance coverage as well. CSW to follow.            Expected Discharge Plan: Skilled Nursing Facility Barriers to Discharge: Continued Medical Work up, English as a second language teacher   Patient Goals and CMS Choice Patient states their goals for this hospitalization and ongoing recovery are:: to get rehab CMS Medicare.gov Compare Post Acute Care list provided to:: Patient Choice offered to / list presented to : Patient, Adult Children [place] ownership interest in [facility].provided to:: Patient    Expected Discharge Plan and Services     Post Acute Care Choice: Skilled Nursing Facility Living arrangements for the past 2 months: Single Family Home                                      Prior Living Arrangements/Services Living arrangements for the past 2 months: Single Family Home Lives with:: Self Patient language and need for interpreter reviewed:: No Do you feel safe going back to the place where you live?: Yes      Need for Family Participation in Patient Care: No (Comment) Care giver support system in place?: No (comment)   Criminal Activity/Legal  Involvement Pertinent to Current Situation/Hospitalization: No - Comment as needed  Activities of Daily Living   ADL Screening (condition at time of admission) Patient's cognitive ability adequate to safely complete daily activities?: No Is the patient deaf or have difficulty hearing?: No Does the patient have difficulty seeing, even when wearing glasses/contacts?: No Does the patient have difficulty concentrating, remembering, or making decisions?: Yes Patient able to express need for assistance with ADLs?: Yes Does the patient have difficulty dressing or bathing?: Yes Independently performs ADLs?: No Does the patient have difficulty walking or climbing stairs?: Yes Weakness of Legs: Both Weakness of Arms/Hands: Both  Permission Sought/Granted Permission sought to share information with : Facility Medical sales representative, Family Supports Permission granted to share information with : Yes, Verbal Permission Granted  Share Information with NAME: [name]  Permission granted to share info w AGENCY: SNF  Permission granted to share info w Relationship: Son     Emotional Assessment Appearance:: Appears stated age Attitude/Demeanor/Rapport: Engaged Affect (typically observed): Appropriate Orientation: : Oriented to Self, Oriented to Place, Oriented to  Time, Oriented to Situation Alcohol / Substance Use: Not Applicable Psych Involvement: No (comment)  Admission diagnosis:  CVA (cerebral vascular accident) (HCC) [I63.9] Cerebrovascular accident (CVA), unspecified mechanism (HCC) [I63.9] Patient Active Problem List   Diagnosis Date Noted   CVA (cerebral vascular accident) (HCC) 03/03/2023   Elevated troponin 03/03/2023   Hypertensive  urgency 03/03/2023   CKD (chronic kidney disease), stage III (HCC) 03/03/2023   Psoriasis 03/03/2023   Centrilobular emphysema (HCC) 10/19/2020   Chronic respiratory failure with hypoxia (HCC) 12/26/2019   Shortness of breath 12/26/2019   Bilateral  pulmonary infiltrates on CXR    Complicated UTI (urinary tract infection) 11/11/2019   AKI (acute kidney injury) (HCC) 11/11/2019   Hyponatremia 11/11/2019   Elevated LFTs 11/11/2019   Sepsis (HCC) 11/11/2019   Wound infection 11/07/2018   Postoperative pain    Sleep disturbance    Tobacco abuse    Acute blood loss anemia    Hypoalbuminemia due to protein-calorie malnutrition (HCC)    Debility 10/24/2018   Pre-operative cardiovascular examination 09/26/2018   HLD (hyperlipidemia) 09/26/2018   GAD (generalized anxiety disorder) 08/07/2018   Major depressive disorder, single episode 08/07/2018   Appendiceal abscess 07/08/2018   PVD (peripheral vascular disease) (HCC) 03/15/2018   Atherosclerosis of native artery of left lower extremity with intermittent claudication (HCC) 03/15/2018   Chronic left-sided low back pain with left-sided sciatica 12/25/2017   Facial droop    History of CVA (cerebrovascular accident) 10/23/2017   Critical lower limb ischemia (HCC) 11/08/2016   Smoker 11/08/2016   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 01/16/2014   Mild cognitive impairment 01/16/2014   Meningioma (HCC) 10/21/2013   Chest pain 10/20/2013   Medicare annual wellness visit, subsequent 06/16/2013   Dizziness and giddiness 06/16/2013   RLS (restless legs syndrome) 04/29/2012   Abdominal pain 12/27/2010   DEGENERATIVE JOINT DISEASE 09/23/2010   CAROTID ARTERY DISEASE 08/15/2010   CHEST PAIN 08/15/2010   HEADACHE 12/02/2009   BACK PAIN 11/22/2009   CONSTIPATION, CHRONIC 01/29/2008   NAUSEA 01/28/2008   Depression 03/08/2007   HTN (hypertension) 03/08/2007   PCP:  Shade Flood, MD Pharmacy:   [facility] [phone] [place], [place] - [address] AT [facility] [address] [place] [address] Phone: [phone] Fax: [phone]     Social Determinants of Health (SDOH) Social  History: SDOH Screenings   Food Insecurity: No Food Insecurity (03/23/2021)  Transportation Needs: No Transportation Needs (03/23/2021)  Depression (PHQ2-9): High Risk (12/21/2022)  Financial Resource Strain: Low Risk  (03/23/2021)  Physical Activity: Inactive (03/23/2021)  Social Connections: Unknown (10/24/2018)  Tobacco Use: High Risk (02/15/2023)   SDOH Interventions:     Readmission Risk Interventions     No data to display

## 2023-03-05 NOTE — NC FL2 (Signed)
[place] MEDICAID FL2 LEVEL OF CARE FORM     IDENTIFICATION  Patient Name: [name] Birthdate: May 27, 1946 Sex: female Admission Date (Current Location): 03/03/2023  [facility] and [place] Number:  Producer, television/film/video and Address:  [facility], [address] [place], [place] [address]      Provider Number: [id number]  Attending Physician Name and Address:  Cathren Harsh, MD  Relative Name and Phone Number:       Current Level of Care: Hospital Recommended Level of Care: Skilled Nursing Facility Prior Approval Number:    Date Approved/Denied:   PASRR Number: [id number]  Discharge Plan: SNF    Current Diagnoses: Patient Active Problem List   Diagnosis Date Noted   CVA (cerebral vascular accident) (HCC) 03/03/2023   Elevated troponin 03/03/2023   Hypertensive urgency 03/03/2023   CKD (chronic kidney disease), stage III (HCC) 03/03/2023   Psoriasis 03/03/2023   Centrilobular emphysema (HCC) 10/19/2020   Chronic respiratory failure with hypoxia (HCC) 12/26/2019   Shortness of breath 12/26/2019   Bilateral pulmonary infiltrates on CXR    Complicated UTI (urinary tract infection) 11/11/2019   AKI (acute kidney injury) (HCC) 11/11/2019   Hyponatremia 11/11/2019   Elevated LFTs 11/11/2019   Sepsis (HCC) 11/11/2019   Wound infection 11/07/2018   Postoperative pain    Sleep disturbance    Tobacco abuse    Acute blood loss anemia    Hypoalbuminemia due to protein-calorie malnutrition (HCC)    Debility 10/24/2018   Pre-operative cardiovascular examination 09/26/2018   HLD (hyperlipidemia) 09/26/2018   GAD (generalized anxiety disorder) 08/07/2018   Major depressive disorder, single episode 08/07/2018   Appendiceal abscess 07/08/2018   PVD (peripheral vascular disease) (HCC) 03/15/2018   Atherosclerosis of native artery of left lower extremity with intermittent claudication (HCC) 03/15/2018   Chronic left-sided low back pain with  left-sided sciatica 12/25/2017   Facial droop    History of CVA (cerebrovascular accident) 10/23/2017   Critical lower limb ischemia (HCC) 11/08/2016   Smoker 11/08/2016   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 01/16/2014   Mild cognitive impairment 01/16/2014   Meningioma (HCC) 10/21/2013   Chest pain 10/20/2013   Medicare annual wellness visit, subsequent 06/16/2013   Dizziness and giddiness 06/16/2013   RLS (restless legs syndrome) 04/29/2012   Abdominal pain 12/27/2010   DEGENERATIVE JOINT DISEASE 09/23/2010   CAROTID ARTERY DISEASE 08/15/2010   CHEST PAIN 08/15/2010   HEADACHE 12/02/2009   BACK PAIN 11/22/2009   CONSTIPATION, CHRONIC 01/29/2008   NAUSEA 01/28/2008   Depression 03/08/2007   HTN (hypertension) 03/08/2007    Orientation RESPIRATION BLADDER Height & Weight     Self, Time, Situation, Place  Normal Continent Weight: 175 lb (79.4 kg) Height:  5\' 7"  (170.2 cm)  BEHAVIORAL SYMPTOMS/MOOD NEUROLOGICAL BOWEL NUTRITION STATUS      Continent Diet (heart healthy)  AMBULATORY STATUS COMMUNICATION OF NEEDS Skin   Extensive Assist Verbally Normal                       Personal Care Assistance Level of Assistance  Bathing, Feeding, Dressing Bathing Assistance: Limited assistance Feeding assistance: Limited assistance Dressing Assistance: Limited assistance     Functional Limitations Info             SPECIAL CARE FACTORS FREQUENCY  PT (By licensed PT), OT (By licensed OT)     PT Frequency: 5x/wk OT Frequency: 5x/wk  Contractures Contractures Info: Not present    Additional Factors Info  Code Status, Allergies Code Status Info: Full Allergies Info: NKA           Current Medications (03/05/2023):  This is the current hospital active medication list Current Facility-Administered Medications  Medication Dose Route Frequency Provider Last Rate Last Admin   acetaminophen  (TYLENOL) tablet 650 mg  650 mg Oral Q4H PRN Madelyn Flavors A, MD       Or   acetaminophen (TYLENOL) 160 MG/5ML solution 650 mg  650 mg Per Tube Q4H PRN Madelyn Flavors A, MD       Or   acetaminophen (TYLENOL) suppository 650 mg  650 mg Rectal Q4H PRN Madelyn Flavors A, MD       amLODipine (NORVASC) tablet 5 mg  5 mg Oral Daily Marvel Plan, MD   5 mg at 03/04/23 1813   aspirin EC tablet 81 mg  81 mg Oral Daily Smith, Rondell A, MD   81 mg at 03/04/23 0915   atorvastatin (LIPITOR) tablet 40 mg  40 mg Oral Daily Katrinka Blazing, Rondell A, MD   40 mg at 03/04/23 0915   clobetasol ointment (TEMOVATE) 0.05 %   Topical BID Clydie Braun, MD   Given at 03/04/23 2231   clopidogrel (PLAVIX) tablet 75 mg  75 mg Oral Daily Kara Mead, NP   75 mg at 03/04/23 0915   enoxaparin (LOVENOX) injection 40 mg  40 mg Subcutaneous Q24H Smith, Rondell A, MD   40 mg at 03/04/23 2232   hydrALAZINE (APRESOLINE) injection 10 mg  10 mg Intravenous Q4H PRN Madelyn Flavors A, MD       losartan (COZAAR) tablet 25 mg  25 mg Oral Daily Marvel Plan, MD   25 mg at 03/04/23 1813   melatonin tablet 10 mg  10 mg Oral QHS PRN Carollee Herter, DO   10 mg at 03/04/23 2232   nicotine (NICODERM CQ - dosed in mg/24 hours) patch 21 mg  21 mg Transdermal Daily Katrinka Blazing, Rondell A, MD   21 mg at 03/04/23 0915   nicotine polacrilex (NICORETTE) gum 2 mg  2 mg Oral PRN Erick Blinks, MD       ondansetron [facility]) injection 4 mg  4 mg Intravenous Q6H PRN Rai, Ripudeep K, MD   4 mg at 03/04/23 2320   rOPINIRole (REQUIP) tablet 2 mg  2 mg Oral QHS PRN Carollee Herter, DO   2 mg at 03/04/23 2232   senna-docusate (Senokot-S) tablet 1 tablet  1 tablet Oral QHS PRN Clydie Braun, MD         Discharge Medications: Please see discharge summary for a list of discharge medications.  Relevant Imaging Results:  Relevant Lab Results:   Additional Information SS#: [id number]  Baldemar Lenis, LCSW

## 2023-03-05 NOTE — Progress Notes (Signed)
Echocardiogram 2D Echocardiogram has been performed.  Lucendia Herrlich 03/05/2023, 9:00 AM

## 2023-03-06 ENCOUNTER — Ambulatory Visit: Payer: Medicare Other | Admitting: Dermatology

## 2023-03-06 DIAGNOSIS — G2581 Restless legs syndrome: Secondary | ICD-10-CM

## 2023-03-06 DIAGNOSIS — J9611 Chronic respiratory failure with hypoxia: Secondary | ICD-10-CM

## 2023-03-06 MED ORDER — NICOTINE 21 MG/24HR TD PT24: 21.0000 mg | MEDICATED_PATCH | Freq: Every day | TRANSDERMAL | 0 refills | Status: DC

## 2023-03-06 MED ORDER — CLOPIDOGREL BISULFATE 75 MG PO TABS: 75.0000 mg | ORAL_TABLET | Freq: Every day | ORAL | Status: AC

## 2023-03-06 MED ORDER — TRAMADOL HCL 50 MG PO TABS
50.0000 mg | ORAL_TABLET | Freq: Three times a day (TID) | ORAL | Status: DC | PRN
  Administered 2023-03-06 (×2): 50 mg via ORAL
  Filled 2023-03-06 (×2): qty 1

## 2023-03-06 MED ORDER — CLOBETASOL PROPIONATE 0.05 % EX OINT: TOPICAL_OINTMENT | Freq: Two times a day (BID) | CUTANEOUS | 0 refills | Status: DC

## 2023-03-06 MED ORDER — ATORVASTATIN CALCIUM 40 MG PO TABS: 40.0000 mg | ORAL_TABLET | Freq: Every day | ORAL | Status: DC

## 2023-03-06 MED ORDER — MORPHINE SULFATE (PF) 2 MG/ML IV SOLN: 1.0000 mg | INTRAVENOUS | Status: DC | PRN

## 2023-03-06 MED ORDER — SENNA 8.6 MG PO TABS
2.0000 | ORAL_TABLET | Freq: Once | ORAL | Status: AC
  Administered 2023-03-06: 17.2 mg via ORAL
  Filled 2023-03-06: qty 2

## 2023-03-06 MED ORDER — FLEET ENEMA 7-19 GM/118ML RE ENEM
1.0000 | ENEMA | Freq: Once | RECTAL | Status: DC
  Filled 2023-03-06: qty 1

## 2023-03-06 MED ORDER — POLYETHYLENE GLYCOL 3350 17 G PO PACK: 17.0000 g | PACK | Freq: Every day | ORAL | 0 refills | Status: DC | PRN

## 2023-03-06 MED ORDER — POLYETHYLENE GLYCOL 3350 17 G PO PACK
17.0000 g | PACK | Freq: Every day | ORAL | Status: DC
  Administered 2023-03-06 – 2023-03-07 (×2): 17 g via ORAL
  Filled 2023-03-06 (×2): qty 1

## 2023-03-06 NOTE — Plan of Care (Signed)
Patient now refusing the discharge, states she is constipated, complaining of headache, abdominal pain, neck pain. She refused enema, stool softeners, MiraLAX or bedside commode.  Ordered Fleet enema if she agrees.   Thad Ranger M.D.  [facility] 03/06/2023, 2:58 PM

## 2023-03-06 NOTE — TOC Progression Note (Signed)
Transition of Care [facility]) - Progression Note    Patient Details  Name: [name] MRN: [id number] Date of Birth: Feb 16, 1946  Transition [facility] Care [facility]) CM/SW Contact  Baldemar Lenis, [place] Phone Number: 03/06/2023, 4:08 PM  Clinical Narrative:   CSW updated by patient's son after hours last night that he would like to select [facility]. CSW contacted [facility], confirmed bed availability. CSW updated MD, patient stable for discharge. CSW sent discharge to [facility], bed is ready for patient. CSW updated son, [name], via phone and he is in agreement. CSW met with patient to update her, and she said she did not feel well enough to discharge today. CSW sent information to MD who will follow up on assisting patient with having a BM. Transport scheduled with [facility] in preparation for discharge.   CSW then updated that [facility] arrived for patient and she is refusing to leave, says she does not fell well enough today and requesting tomorrow. CSW updated [facility], will continue to follow.    Expected Discharge Plan: Skilled Nursing Facility Barriers to Discharge: Continued Medical Work up  Expected Discharge Plan and Services     Post Acute Care Choice: Skilled Nursing Facility Living arrangements for the past 2 months: Single Family Home Expected Discharge Date: 03/06/23                                     Social Determinants of Health (SDOH) Interventions SDOH Screenings   Food Insecurity: No Food Insecurity (03/23/2021)  Transportation Needs: No Transportation Needs (03/23/2021)  Depression (PHQ2-9): High Risk (12/21/2022)  Financial Resource Strain: Low Risk  (03/23/2021)  Physical Activity: Inactive (03/23/2021)  Social Connections: Unknown (10/24/2018)  Tobacco Use: High Risk (02/15/2023)    Readmission Risk Interventions     No data to display

## 2023-03-06 NOTE — Plan of Care (Signed)
  Problem: Education: Goal: Knowledge of disease or condition will improve Outcome: Progressing Goal: Knowledge of secondary prevention will improve (MUST DOCUMENT ALL) Outcome: Progressing Goal: Knowledge of patient specific risk factors will improve (Mark N/A or DELETE if not current risk factor) Outcome: Progressing   Problem: Ischemic Stroke/TIA Tissue Perfusion: Goal: Complications of ischemic stroke/TIA will be minimized Outcome: Progressing   Problem: Coping: Goal: Will verbalize positive feelings about self Outcome: Progressing Goal: Will identify appropriate support needs Outcome: Progressing   Problem: Health Behavior/Discharge Planning: Goal: Ability to manage health-related needs will improve Outcome: Progressing Goal: Goals will be collaboratively established with patient/family Outcome: Progressing   Problem: Self-Care: Goal: Ability to participate in self-care as condition permits will improve Outcome: Progressing Goal: Verbalization of feelings and concerns over difficulty with self-care will improve Outcome: Progressing Goal: Ability to communicate needs accurately will improve Outcome: Progressing   Problem: Nutrition: Goal: Risk of aspiration will decrease Outcome: Progressing Goal: Dietary intake will improve Outcome: Progressing   Problem: Education: Goal: Knowledge of disease or condition will improve Outcome: Progressing Goal: Knowledge of secondary prevention will improve (MUST DOCUMENT ALL) Outcome: Progressing Goal: Knowledge of patient specific risk factors will improve (Mark N/A or DELETE if not current risk factor) Outcome: Progressing   Problem: Ischemic Stroke/TIA Tissue Perfusion: Goal: Complications of ischemic stroke/TIA will be minimized Outcome: Progressing   Problem: Coping: Goal: Will verbalize positive feelings about self Outcome: Progressing Goal: Will identify appropriate support needs Outcome: Progressing   Problem:  Health Behavior/Discharge Planning: Goal: Ability to manage health-related needs will improve Outcome: Progressing Goal: Goals will be collaboratively established with patient/family Outcome: Progressing   Problem: Self-Care: Goal: Ability to participate in self-care as condition permits will improve Outcome: Progressing Goal: Verbalization of feelings and concerns over difficulty with self-care will improve Outcome: Progressing Goal: Ability to communicate needs accurately will improve Outcome: Progressing   Problem: Nutrition: Goal: Risk of aspiration will decrease Outcome: Progressing Goal: Dietary intake will improve Outcome: Progressing   

## 2023-03-06 NOTE — Care Management Important Message (Signed)
Important Message  Patient Details  Name: [name] MRN: [id number] Date of Birth: 1945/10/14   Medicare Important Message Given:  Yes     Dorena Bodo 03/06/2023, 3:01 PM

## 2023-03-06 NOTE — Progress Notes (Signed)
Physical Therapy Treatment Patient Details Name: [name] MRN: [id number] DOB: 1946/02/26 Today's Date: 03/06/2023   History of Present Illness Pt is a 77 y.o. female who presented 03/03/23 with R-sided weakness and chest pain. MRI revealed acute lacunar infarct in the left corona radiata and 13 mm meningioma at the left vertex. 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 Pt with mod assist for bed mobility, with notable difficulty in initiating and navigating RLE to EOB and achieving upright seated position. Pt with 2 attempts to stand to RW, requiring mod A x2 person to complete, difficulty with looking up and extending their trunk. On second attempt pt able to take small lateral steps to their L to step-pivot to the recliner, RLE knee buckling, and difficulty managing the RW. Pt will continue to benefit from skilled PT during their acute admission to facilitate progression of functional mobility.    Recommendations for follow up therapy are one component of a multi-disciplinary discharge planning process, led by the attending physician.  Recommendations may be updated based on patient status, additional functional criteria and insurance authorization.  Follow Up Recommendations  Can patient physically be transported by private vehicle: No    Assistance Recommended at Discharge Intermittent Supervision/Assistance  Patient can return home with the following Two people to help with walking and/or transfers;A lot of help with bathing/dressing/bathroom;Assistance with cooking/housework;Assist for transportation;Help with stairs or  ramp for entrance   Equipment Recommendations  Other (comment)    Recommendations for Other Services       Precautions / Restrictions Precautions Precautions: Fall Restrictions Weight Bearing Restrictions: No     Mobility  Bed Mobility Overal bed mobility: Needs Assistance Bed Mobility: Supine to Sit     Supine to sit: Mod assist, +2 for physical assistance, HOB elevated     General bed mobility comments: unable to initiate/navigate RLE to EOB, requires mod A for trunk to upright and decreased initiation for LUE push to upright    Transfers Overall transfer level: Needs assistance Equipment used: Rolling walker (2 wheels) Transfers: Sit to/from Stand, Bed to chair/wheelchair/BSC Sit to Stand: Mod assist, +2 physical assistance   Step pivot transfers: Mod assist, +2 physical assistance            Ambulation/Gait Ambulation/Gait assistance: Mod assist, +2 physical assistance Gait Distance (Feet): 2 Feet Assistive device: Rolling walker (2 wheels) Gait Pattern/deviations: Step-to pattern, Knees buckling, Decreased weight shift to left Gait velocity: reduced Gait velocity interpretation: <1.31 ft/sec, indicative of household ambulator   General Gait Details: Facilitation for RUE contact to RW, small steps with minor RLE buckling. Difficulty with RW management and weight shifting to R   Stairs             Wheelchair Mobility    Modified Rankin (Stroke Patients Only) Modified Rankin (Stroke Patients Only) Pre-Morbid Rankin Score: Moderate disability Modified Rankin: Moderately severe disability     Balance Overall balance assessment: Needs assistance Sitting-balance support: No upper extremity supported, Feet supported Sitting balance-Leahy Scale: Fair   Postural control: Right lateral lean Standing balance support: Bilateral upper extremity supported, During functional activity, Reliant on assistive device for balance Standing balance-Leahy Scale:  Poor Standing  balance comment: Reliant on RW and external physical assistance                            Cognition Arousal/Alertness: Awake/alert Behavior During Therapy: WFL for tasks assessed/performed Overall Cognitive Status: Impaired/Different from baseline Area of Impairment: Safety/judgement, Awareness                   Current Attention Level: Selective     Safety/Judgement: Decreased awareness of deficits, Decreased awareness of safety Awareness: Emergent            Exercises      General Comments General comments (skin integrity, edema, etc.): Complains of new neck pain, advised nursing to return to bed with stedy or step-pivot with RW.      Pertinent Vitals/Pain Pain Assessment Pain Assessment: Faces Faces Pain Scale: Hurts little more Pain Location: Neck, bilateral shoulders/traps Pain Descriptors / Indicators: Constant, Discomfort, Moaning Pain Intervention(s): Limited activity within patient's tolerance, Monitored during session    Home Living                          Prior Function            PT Goals (current goals can now be found in the care plan section) Acute Rehab PT Goals Patient Stated Goal: sleep better PT Goal Formulation: With patient Time For Goal Achievement: 03/18/23 Potential to Achieve Goals: Good Progress towards PT goals: Progressing toward goals    Frequency    Min 3X/week      PT Plan Current plan remains appropriate    Co-evaluation              AM-PAC PT "6 Clicks" Mobility   Outcome Measure  Help needed turning from your back to your side while in a flat bed without using bedrails?: A Lot Help needed moving from lying on your back to sitting on the side of a flat bed without using bedrails?: A Lot Help needed moving to and from a bed to a chair (including a wheelchair)?: A Lot Help needed standing up from a chair using your arms (e.g., wheelchair or bedside chair)?: A Lot Help  needed to walk in hospital room?: Total Help needed climbing 3-5 steps with a railing? : Total 6 Click Score: 10    End of Session Equipment Utilized During Treatment: Gait belt Activity Tolerance: Patient tolerated treatment well Patient left: in chair;with call bell/phone within reach;with chair alarm set Nurse Communication: Mobility status PT Visit Diagnosis: Unsteadiness on feet (R26.81);Other abnormalities of gait and mobility (R26.89);Muscle weakness (generalized) (M62.81);Difficulty in walking, not elsewhere classified (R26.2);Hemiplegia and hemiparesis;Other symptoms and signs involving the nervous system (R29.898) Hemiplegia - Right/Left: Right Hemiplegia - caused by: Cerebral infarction     Time: 4742-5956 PT Time Calculation (min) (ACUTE ONLY): 20 min  Charges:  $Therapeutic Activity: 8-22 mins                     Hendricks Milo, SPT  Acute Rehabilitation Services    Hendricks Milo 03/06/2023, 1:48 PM

## 2023-03-06 NOTE — Progress Notes (Signed)
Pt refused lovenox. Educated on the risk of not taking. Pt stated "I'm stubborn. I don't really care"   03/06/23 2107  Provider Notification  Provider Name/Title Dr. Imogene Burn  Date Provider Notified 03/06/23  Time Provider Notified 2107  Method of Notification  (amion)  Notification Reason Red med refusal  Provider response No new orders  Date of Provider Response 03/06/23  Time of Provider Response 2107

## 2023-03-06 NOTE — Discharge Summary (Signed)
Physician Discharge Summary   Patient: [name] MRN: [id number] DOB: Jul 28, 1946  Admit date:     03/03/2023  Discharge date: 03/06/23  Discharge Physician: Thad Ranger, MD    PCP: Shade Flood, MD   Recommendations at discharge:   Continue aspirin 81 mg daily, Plavix 75 mg daily for 21 days then transition to aspirin alone.   Lipitor 40 mg daily Continue clobetasol ointment to the psoriatic rash twice daily, needs outpatient dermatology appointment Needs BP control.  Discharge Diagnoses:  Acute CVA (cerebral vascular accident) (HCC)   Hypertensive urgency   CHEST PAIN   Elevated troponin   CKD (chronic kidney disease), stage III (HCC)   HLD (hyperlipidemia)   Psoriasis   PVD (peripheral vascular disease) (HCC)   Depression   Meningioma (HCC)   Hypothyroid ?   History of CVA (cerebrovascular accident)   Debility   Tobacco abuse  Hospital Course: Patient is a 77 year old female with hypertension, idiopathic pulmonary fibrosis, atheroembolic previously on [facility], prior CVA in 2019, hypothyroidism meningioma, PVD status post posterior left femoral-popliteal bypass in 2021, CKD stage IIIb, psoriasis, depression, bipolar disorder, tobacco use presented with complaints of right-sided weakness.  She had fallen asleep on the couch a day before the admission around 6 PM and woke up at 9 PM with chest pain and pressure.  Symptoms resolved and she fell back asleep and then woke up around 1 AM and noted that she was unable to move her right hand or leg.  At baseline, able to ambulate with use of rollator. In ED, BP elevated to 209/95, temp 98.1 F, RR 13, HR 16 Creatinine 1.3 MRI brain revealed acute lacunar infarct of the left corona radiator in the background of extensive chronic small vessel ischemia. Neurology was consulted.    Assessment and Plan:  Acute CVA (cerebral vascular accident) (HCC), prior history of CVA in 2019 -Presented with right arm and leg weakness, MRI  revealed acute lacunar infarct of the left corona radiator -CTA head and neck showed no emergent finding, atherosclerosis with high-grade narrowing at the origin of nondominant left vertebral artery, advanced chronic small vessel ischemia -Neurology was consulted, recommended aspirin and Plavix for 21 days and then transition to aspirin alone --Lives alone, PT evaluation recommended SNF  - Lipid panel showed HDL 54, LDL 96, triglycerides 103, placed on Lipitor 40 mg daily. - Hemoglobin A1c 5.9 - 2D echo showed EF of 70 to 75%, severe LVH, diastolic function could not be evaluated, RV SF normal.  Hypertensive urgency Initial blood pressures elevated up to 209/95.   -Patient had been recently seen by cardiology and started on amlodipine, losartan however did not report taking medications regularly.   -Recommended BP control, risk factor modification, continue losartan, amlodipine   Atypical chest pain -Reported waking up around 9 PM the night before the admission with chest pain, resolved. -2D echo showed EF of 70 to 75%, severe LVH, no acute regional wall abnormalities   Chronic kidney disease stage IIIb Creatinine 1.3 which appears around patient's baseline. -Creatinine improved to 1.0   Hyperlipidemia -Lipid panel showed HDL 54, LDL 96, triglycerides 103. -Continue Lipitor 40 g daily     Psoriasis Patient with psoriatic rash of the hands upper and lower extremities.  Denied taking anything for treatment. -Clobetasol ointment to affected areas, outpatient follow-up with dermatology recommended   Peripheral vascular disease Patient noted have a prior history of atheroembolic disease for which she had been on Xarelto in the past but stopped  several years ago.  Not on any medications for treatment. -Continue aspirin, Plavix, and statin   ?hypothyroidism -Not on any medication for hypothyroidism, TSH 4.7      Major depressive disorder Anxiety Patient appears to be followed in the  outpatient setting by behavioral health, but reported not taking Lamictal and Cymbalta. -Continue ropinirole and melatonin   Idiopathic pulmonary fibrosis -Stable, O2 sats 95% on room air    Meningioma CT imaging noted small nonworrisome meningioma of the left vertex which was noted to be approximately 13 mm on MRI.   Tobacco abuse Patient reports smoking a pack cigarettes per day on average. -Counseled on need of cessation of tobacco use -Nicotine patch offered   Debility Patient got around with use of a rolling walker at baseline. PT OT evaluation recommended SNF   Estimated body mass index is 27.41 kg/m as calculated from the following:   Height as of this encounter: 5\' 7"  (1.702 m).   Weight as of this encounter: 79.4 kg.      Pain control - [employer] Controlled Substance Reporting System database was reviewed. and patient was instructed, not to drive, operate heavy machinery, perform activities at heights, swimming or participation in water activities or provide baby-sitting services while on Pain, Sleep and Anxiety Medications; until their outpatient Physician has advised to do so again. Also recommended to not to take more than prescribed Pain, Sleep and Anxiety Medications.  Consultants: Neurology Procedures performed: 2D echo Disposition: Skilled nursing facility Diet recommendation:  Discharge Diet Orders (From admission, onward)     Start     Ordered   03/06/23 0000  Diet - low sodium heart healthy        03/06/23 1148            DISCHARGE MEDICATION: Allergies as of 03/06/2023   No Known Allergies      Medication List     STOP taking these medications    clonazePAM 1 MG tablet Commonly known as: KLONOPIN   triamcinolone cream 0.1 % Commonly known as: KENALOG       TAKE these medications    albuterol 108 (90 Base) MCG/ACT inhaler Commonly known as: Ventolin HFA Inhale 1-2 puffs into the lungs every 6 (six) hours as needed for wheezing  or shortness of breath.   amLODipine 5 MG tablet Commonly known as: NORVASC Take 1 tablet (5 mg total) by mouth daily.   aspirin EC 81 MG tablet Take 81 mg by mouth daily. Swallow whole.   atorvastatin 40 MG tablet Commonly known as: LIPITOR Take 1 tablet (40 mg total) by mouth daily. Start taking on: March 07, 2023   clobetasol ointment 0.05 % Commonly known as: TEMOVATE Apply topically 2 (two) times daily. Apply to affected areas with psoriatic rash   clopidogrel 75 MG tablet Commonly known as: PLAVIX Take 1 tablet (75 mg total) by mouth daily for 21 days. Start taking on: March 07, 2023   famotidine 20 MG tablet Commonly known as: PEPCID Take 20 mg by mouth daily as needed for indigestion.   losartan 25 MG tablet Commonly known as: COZAAR Take 1 tablet (25 mg total) by mouth daily.   Melatonin 10 MG Tabs Take 10 mg by mouth at bedtime as needed (for sleep).   nicotine 21 mg/24hr patch Commonly known as: NICODERM CQ - dosed in mg/24 hours Place 1 patch (21 mg total) onto the skin daily. Start taking on: March 07, 2023   polyethylene glycol 17 g packet  Commonly known as: MIRALAX / GLYCOLAX Take 17 g by mouth daily as needed for mild constipation or moderate constipation.   rOPINIRole 2 MG tablet Commonly known as: REQUIP TAKE 1 TABLET(2 MG) BY MOUTH AT BEDTIME What changed: See the new instructions.        Follow-up Information     [facility]. Schedule an appointment as soon as possible for a visit in 1 month(s).   Specialty: Neurology Why: stroke clinic Contact information: [address] [place] [address] [phone]        Shade Flood, MD. Schedule an appointment as soon as possible for a visit in 2 week(s).   Specialties: Family Medicine, Sports Medicine Why: for hospital follow-up Contact information: [address] [place] [address] [phone]                 Discharge Exam: Ceasar Mons Weights   03/03/23 0330  Weight: 79.4 kg   S: No acute complaints, no chest pain, shortness of breath, fevers or chills.  BP 134/70 (BP Location: Left Arm)   Pulse 66   Temp 98.1 F (36.7 C) (Oral)   Resp 20   Ht 5\' 7"  (1.702 m)   Wt 79.4 kg   SpO2 98%   BMI 27.41 kg/m   Physical Exam General: Alert and oriented x 3, NAD Cardiovascular: S1 S2 clear, RRR.  Respiratory: CTAB, no wheezing, rales or rhonchi Gastrointestinal: Soft, nontender, nondistended, NBS Ext: no pedal edema bilaterally Neuro: mild RUE and RLE weakness compared to the left Skin: Erythematous scaly psoriatic rashes on the hands, arms and legs Psych: Normal affect   Condition at discharge: fair  The results of significant diagnostics from this hospitalization (including imaging, microbiology, ancillary and laboratory) are listed below for reference.   Imaging Studies: ECHOCARDIOGRAM COMPLETE  Result Date: 03/05/2023    ECHOCARDIOGRAM REPORT   Patient Name:   LOURIE RETZ Date of Exam: 03/05/2023 Medical Rec #:  [id number]      Height:       67.0 in Accession #:    [id number]     Weight:       175.0 lb Date of Birth:  [birth year]       BSA:          1.911 m Patient Age:    77 years       BP:           127/77 mmHg Patient Gender: F              HR:           65 bpm. Exam Location:  Inpatient Procedure: 2D Echo, Cardiac Doppler and Color Doppler Indications:    Stroke I63.9  History:        Patient has prior history of Echocardiogram examinations, most                 recent 11/18/2019. CAD, Stroke, Signs/Symptoms:Chest Pain; Risk                 Factors:Hypertension, Current Smoker and Dyslipidemia. CKD,                 stage 3.  Sonographer:    Lucendia Herrlich Referring Phys: [id number] RONDELL A SMITH  Sonographer Comments: Image acquisition challenging due to uncooperative patient. IMPRESSIONS  1. Left ventricular ejection fraction, by estimation, is 70 to 75%. The left ventricle has  hyperdynamic function. The left ventricle has no regional  wall motion abnormalities. There is severe left ventricular hypertrophy. Left ventricular diastolic function could not be evaluated.  2. Right ventricular systolic function is normal. The right ventricular size is normal.  3. A small pericardial effusion is present. There is no evidence of cardiac tamponade.  4. The mitral valve is grossly normal. Trivial mitral valve regurgitation.  5. The aortic valve is tricuspid. There is mild calcification of the aortic valve. There is mild thickening of the aortic valve. Aortic valve regurgitation is trivial. Aortic valve sclerosis is present, with no evidence of aortic valve stenosis. Comparison(s): No significant change from prior study. Conclusion(s)/Recommendation(s): Patient did not tolerate study--unable to get any views beyond parasternal windows. FINDINGS  Left Ventricle: Left ventricular ejection fraction, by estimation, is 70 to 75%. The left ventricle has hyperdynamic function. The left ventricle has no regional wall motion abnormalities. The left ventricular internal cavity size was small. There is severe left ventricular hypertrophy. Left ventricular diastolic function could not be evaluated. Right Ventricle: The right ventricular size is normal. Right vetricular wall thickness was not well visualized. Right ventricular systolic function is normal. Left Atrium: Left atrial size was not well visualized. Right Atrium: Right atrial size was not well visualized. Pericardium: A small pericardial effusion is present. There is no evidence of cardiac tamponade. Mitral Valve: The mitral valve is grossly normal. Trivial mitral valve regurgitation. Tricuspid Valve: The tricuspid valve is normal in structure. Tricuspid valve regurgitation is trivial. No evidence of tricuspid stenosis. Aortic Valve: The aortic valve is tricuspid. There is mild calcification of the aortic valve. There is mild thickening of the aortic  valve. Aortic valve regurgitation is trivial. Aortic valve sclerosis is present, with no evidence of aortic valve stenosis. Pulmonic Valve: The pulmonic valve was not well visualized. Pulmonic valve regurgitation is not visualized. No evidence of pulmonic stenosis. Aorta: The aortic root and ascending aorta are structurally normal, with no evidence of dilitation. IAS/Shunts: The interatrial septum was not assessed.  LEFT VENTRICLE PLAX 2D LVIDd:         2.61 cm LVIDs:         1.36 cm LV PW:         1.60 cm LV IVS:        1.74 cm LVOT diam:     1.90 cm LVOT Area:     2.84 cm  LEFT ATRIUM         Index LA diam:    3.10 cm 1.62 cm/m   AORTA Ao Root diam: 3.30 cm Ao Asc diam:  3.50 cm TRICUSPID VALVE TR Peak grad:   13.2 mmHg TR Vmax:        182.00 cm/s  SHUNTS Systemic Diam: 1.90 cm Jodelle Red MD Electronically signed by Jodelle Red MD Signature Date/Time: 03/05/2023/10:41:07 AM    Final    MR BRAIN WO CONTRAST  Result Date: 03/03/2023 CLINICAL DATA:  Stroke follow-up.  Right arm and leg weakness. EXAM: MRI HEAD WITHOUT CONTRAST TECHNIQUE: Multiplanar, multiecho pulse sequences of the brain and surrounding structures were obtained without intravenous contrast. COMPARISON:  Head CT and CTA from earlier today FINDINGS: Brain: 1 cm restricted diffusion in the left corona radiata. There is a background of extensive chronic small vessel ischemia in the cerebral white matter with chronic right thalamic infarct and small bilateral cerebellar infarcts. Meningioma at the left vertex measuring 13 mm. Node significant mass effect on the brain. No hydrocephalus or collection. Vascular: Normal flow voids Skull and upper cervical spine: Normal marrow signal  Sinuses/Orbits: Negative IMPRESSION: 1. Acute lacunar infarct in the left corona radiata. 2. Background of extensive chronic small vessel ischemia. 3. 13 mm meningioma at the left vertex. Electronically Signed   By: Tiburcio Pea M.D.   On: 03/03/2023  06:53   CT ANGIO HEAD NECK W WO CM  Result Date: 03/03/2023 CLINICAL DATA:  Stroke follow-up. Right-sided weakness involving the arm and leg EXAM: CT ANGIOGRAPHY HEAD AND NECK WITH AND WITHOUT CONTRAST TECHNIQUE: Multidetector CT imaging of the head and neck was performed using the standard protocol during bolus administration of intravenous contrast. Multiplanar CT image reconstructions and MIPs were obtained to evaluate the vascular anatomy. Carotid stenosis measurements (when applicable) are obtained utilizing NASCET criteria, using the distal internal carotid diameter as the denominator. RADIATION DOSE REDUCTION: This exam was performed according to the departmental dose-optimization program which includes automated exposure control, adjustment of the mA and/or kV according to patient size and/or use of iterative reconstruction technique. CONTRAST:  75mL OMNIPAQUE IOHEXOL 350 MG/ML SOLN COMPARISON:  10/05/2022 head CT FINDINGS: CT HEAD FINDINGS Brain: Extensive chronic small vessel ischemia in the cerebral white matter with confluent low-density. Chronic right thalamic infarct. Partially calcified meningioma at the left vertex measuring 12 mm. Vascular: No hyperdense vessel or unexpected calcification. Skull: Normal. Negative for fracture or focal lesion. Sinuses/Orbits: No acute finding. Review of the MIP images confirms the above findings CTA NECK FINDINGS Aortic arch: Atheromatous plaque with 2 vessel branching. No acute finding or dilatation Right carotid system: Diffuse atheromatous plaque, primarily calcified. No ulceration, beading, or flow limiting stenosis. Left carotid system: Diffuse atheromatous plaque, primarily calcified. No ulceration, beading, or flow limiting stenosis. Vertebral arteries: No proximal subclavian flow limiting stenosis, although there is atheromatous plaque on both sides. Right dominant vertebral artery. Vertebral tortuosity on both sides, plaque causes high-grade narrowing at  the left vertebral origin based on coronal reformats, patent lumen not accurately measurable due to degree of plaque and small vessel size. Skeleton: Degenerative endplate and facet spurring. Other neck: Negative Upper chest: Biapical emphysema Review of the MIP images confirms the above findings CTA HEAD FINDINGS Anterior circulation: Atheromatous calcification especially at the carotid siphons. No significant stenosis, proximal occlusion, aneurysm, or vascular malformation. Posterior circulation: The vertebral and basilar arteries are smoothly contoured and widely patent. No branch occlusion, beading, or aneurysm. Venous sinuses: Diffusely patent as permitted by contrast timing Anatomic variants: Fetal type right PCA. Review of the MIP images confirms the above findings IMPRESSION: 1. No emergent finding. 2. Atherosclerosis with high-grade narrowing at the origin of the non dominant left vertebral artery. 3. Advanced chronic small vessel ischemia. 4. Small and non worrisome meningioma at the left vertex. Electronically Signed   By: Tiburcio Pea M.D.   On: 03/03/2023 04:50   US Abdomen Limited RUQ (LIVER/GB)  Result Date: 02/13/2023 CLINICAL DATA:  Right upper quadrant pain EXAM: ULTRASOUND ABDOMEN LIMITED RIGHT UPPER QUADRANT COMPARISON:  CT from earlier in the same day. FINDINGS: Gallbladder: Gallstone is noted which was not well appreciated on prior CT examination. No wall thickening or pericholecystic fluid is noted. Common bile duct: Diameter: 4 mm mild intrahepatic ductal dilatation is seen similar to that noted on the prior CT examination although no obstructing lesion is seen. Liver: No focal lesion identified. Within normal limits in parenchymal echogenicity. Portal vein is patent on color Doppler imaging with normal direction of blood flow towards the liver. Other: None. IMPRESSION: Cholelithiasis without complicating factors. Mild intrahepatic ductal dilatation which may be within normal  limits for  the patient's age as no obstructing lesion is seen on recent CT examination. Correlate with laboratory values. Electronically Signed   By: Alcide Clever M.D.   On: 02/13/2023 19:38   CT CHEST ABDOMEN PELVIS W CONTRAST  Result Date: 02/13/2023 CLINICAL DATA:  Right upper quadrant pain and generalized weakness for 2 days, initial encounter EXAM: CT CHEST, ABDOMEN, AND PELVIS WITH CONTRAST TECHNIQUE: Multidetector CT imaging of the chest, abdomen and pelvis was performed following the standard protocol during bolus administration of intravenous contrast. RADIATION DOSE REDUCTION: This exam was performed according to the departmental dose-optimization program which includes automated exposure control, adjustment of the mA and/or kV according to patient size and/or use of iterative reconstruction technique. CONTRAST:  OMNIPAQUE IOHEXOL 300 MG/ML  SOLN COMPARISON:  Ultrasound from the same day, CT from 10/05/2022 FINDINGS: CT CHEST FINDINGS Cardiovascular: Atherosclerotic calcifications of the thoracic aorta are noted. No aneurysmal dilatation or dissection is seen. No cardiac enlargement is noted. Coronary calcifications are seen. No pulmonary emboli are noted although timing was not performed for embolus evaluation. Mediastinum/Nodes: Thoracic inlet is within normal limits. No hilar or mediastinal adenopathy is noted. The esophagus as visualized is within normal limits. Lungs/Pleura: Fibrotic changes are again identified within both lungs predominately in the subpleural location stable in appearance from the prior exam. Emphysematous changes are again seen. Stable 6 mm pulmonary nodule is noted in the right upper lobe best seen on image number 80 of series 3. second 3-4 mm nodule is noted within the right middle lobe best seen on image number 101 of series 3. No other sizable parenchymal nodules are seen. No effusion or pneumothorax is noted. Musculoskeletal: No acute rib abnormality is noted. Degenerative  changes of the thoracic spine are seen. Calcified loose bodies are noted in the right shoulder joint. CT ABDOMEN PELVIS FINDINGS Hepatobiliary: Gallbladder is within normal limits. Liver shows no focal mass lesion. Minimal central biliary prominence is noted although no obstructing lesion or stone is seen. Pancreas: Unremarkable. No pancreatic ductal dilatation or surrounding inflammatory changes. Spleen: Normal in size without focal abnormality. Adrenals/Urinary Tract: Adrenal glands are within normal limits. Kidneys are well visualized bilaterally within normal enhancement pattern. No renal calculi or obstructive changes are noted. Multiple cysts are seen bilaterally. The largest of these measures 2.6 cm in the lower pole of the left kidney. They all appear simple in nature and no further follow-up is recommended. No obstructive changes are seen. The bladder is well distended. Stomach/Bowel: Scattered fecal material is noted throughout the colon. No obstructive or inflammatory changes of the colon are seen. The appendix is not well visualized. No inflammatory changes to suggest appendicitis are noted. Small bowel and stomach are within normal limits. Vascular/Lymphatic: Aortic atherosclerosis. Mild aneurysmal dilatation of the infrarenal aorta to 3.2 cm is noted. No dissection is seen. No enlarged abdominal or pelvic lymph nodes. Reproductive: Uterus and bilateral adnexa are unremarkable. Other: No abdominal wall hernia or abnormality. No abdominopelvic ascites. Musculoskeletal: Degenerative changes of lumbar spine are noted. No acute bony abnormality is seen. IMPRESSION: CT of the chest: Stable pulmonary nodules when compared with the prior exam as well as prior exams dating back to 2021. Given their long-term stability no further follow-up is recommended. Chronic fibrotic and emphysematous changes. CT of the abdomen and pelvis: Abdominal aortic aneurysm measuring 3.2 cm infrarenal. Recommend follow-up every 3  years. Reference: J Am Coll Radiol 2013;10:789-794. No acute abnormality is noted in the abdomen and pelvis. Aortic Atherosclerosis (  ICD10-I70.0) and Emphysema (ICD10-J43.9). Electronically Signed   By: Alcide Clever M.D.   On: 02/13/2023 19:37   DG Chest 2 View  Result Date: 02/13/2023 CLINICAL DATA:  Shortness of breath. Right upper quadrant pain for 2 days. EXAM: CHEST - 2 VIEW COMPARISON:  Chest radiographs 11/27/2022 and 10/05/2022; CT chest 10/05/2022 FINDINGS: Cardiac silhouette is again mildly enlarged. Dense atherosclerotic calcifications are again seen within aortic arch. Redemonstration of bilateral high-grade diffuse interstitial thickening and coarse reticular opacities consistent with known chronic lung disease. Mildly decreased lung volumes compared to most recent 11/27/2022 radiograph. Mildly increased densities overlying the bilateral lower lungs is favored to represent bronchovascular crowding and crowding of the scarring rather than an acute airspace opacity. No pleural effusion pneumothorax. Moderate multilevel degenerative disc changes of the thoracic spine. IMPRESSION: 1. Redemonstration of bilateral high-grade diffuse interstitial thickening and coarse reticular opacities consistent with known chronic lung disease. 2. Mildly decreased lung volumes. No definite acute airspace opacity. Electronically Signed   By: Neita Garnet M.D.   On: 02/13/2023 18:19    Microbiology: Results for orders placed or performed during the hospital encounter of 10/15/20  SARS CORONAVIRUS 2 (TAT 6-24 HRS) Nasopharyngeal Nasopharyngeal Swab     Status: None   Collection Time: 10/15/20  3:27 PM   Specimen: Nasopharyngeal Swab  Result Value Ref Range Status   SARS Coronavirus 2 NEGATIVE NEGATIVE Final    Comment: (NOTE) SARS-CoV-2 target nucleic acids are NOT DETECTED.  The SARS-CoV-2 RNA is generally detectable in upper and lower respiratory specimens during the acute phase of infection.  Negative results do not preclude SARS-CoV-2 infection, do not rule out co-infections with other pathogens, and should not be used as the sole basis for treatment or other patient management decisions. Negative results must be combined with clinical observations, patient history, and epidemiological information. The expected result is Negative.  Fact Sheet for Patients: HairSlick.no  Fact Sheet for Healthcare Providers: quierodirigir.com  This test is not yet approved or cleared by the [place] FDA and  has been authorized for detection and/or diagnosis of SARS-CoV-2 by FDA under an Emergency Use Authorization (EUA). This EUA will remain  in effect (meaning this test can be used) for the duration of the COVID-19 declaration under Se ction 564(b)(1) of the Act, 21 U.S.C. section 360bbb-3(b)(1), unless the authorization is terminated or revoked sooner.  Performed at [facility], [address] [place], [place] [address]     Labs: CBC: Recent Labs  Lab 03/03/23 [phone] 03/03/23 0346 03/04/23 0607  WBC 6.8  --  7.0  NEUTROABS 3.7  --   --   HGB 12.0 12.6 13.1  HCT 38.2 37.0 39.6  MCV 89.5  --  89.2  PLT 269  --  260   Basic Metabolic Panel: Recent Labs  Lab 03/03/23 0336 03/03/23 0346 03/04/23 0623  NA 138 138 136  K 3.5 3.6 3.5  CL 101 103 104  CO2 26  --  24  GLUCOSE 98 90 109*  BUN 22 25* 14  CREATININE 1.30* 1.30* 1.07*  CALCIUM 9.1  --  8.7*   Liver Function Tests: Recent Labs  Lab 03/03/23 0336  AST 15  ALT 12  ALKPHOS 71  BILITOT 0.4  PROT 6.5  ALBUMIN 3.0*   CBG: No results for input(s): "GLUCAP" in the last 168 hours.  Discharge time spent: greater than 30 minutes.  Signed: Thad Ranger, MD [facility] 03/06/2023

## 2023-03-07 DIAGNOSIS — F4323 Adjustment disorder with mixed anxiety and depressed mood: Secondary | ICD-10-CM | POA: Diagnosis not present

## 2023-03-07 DIAGNOSIS — I714 Abdominal aortic aneurysm, without rupture, unspecified: Secondary | ICD-10-CM | POA: Diagnosis not present

## 2023-03-07 DIAGNOSIS — L409 Psoriasis, unspecified: Secondary | ICD-10-CM | POA: Diagnosis not present

## 2023-03-07 DIAGNOSIS — F39 Unspecified mood [affective] disorder: Secondary | ICD-10-CM | POA: Diagnosis not present

## 2023-03-07 DIAGNOSIS — R41841 Cognitive communication deficit: Secondary | ICD-10-CM | POA: Diagnosis not present

## 2023-03-07 DIAGNOSIS — R52 Pain, unspecified: Secondary | ICD-10-CM | POA: Diagnosis not present

## 2023-03-07 DIAGNOSIS — G4700 Insomnia, unspecified: Secondary | ICD-10-CM | POA: Diagnosis not present

## 2023-03-07 DIAGNOSIS — B3731 Acute candidiasis of vulva and vagina: Secondary | ICD-10-CM | POA: Diagnosis not present

## 2023-03-07 DIAGNOSIS — M4986 Spondylopathy in diseases classified elsewhere, lumbar region: Secondary | ICD-10-CM | POA: Diagnosis not present

## 2023-03-07 DIAGNOSIS — J432 Centrilobular emphysema: Secondary | ICD-10-CM | POA: Diagnosis not present

## 2023-03-07 DIAGNOSIS — Z8701 Personal history of pneumonia (recurrent): Secondary | ICD-10-CM | POA: Diagnosis not present

## 2023-03-07 DIAGNOSIS — J8410 Pulmonary fibrosis, unspecified: Secondary | ICD-10-CM | POA: Diagnosis not present

## 2023-03-07 DIAGNOSIS — E785 Hyperlipidemia, unspecified: Secondary | ICD-10-CM | POA: Diagnosis not present

## 2023-03-07 DIAGNOSIS — F3130 Bipolar disorder, current episode depressed, mild or moderate severity, unspecified: Secondary | ICD-10-CM | POA: Diagnosis not present

## 2023-03-07 DIAGNOSIS — Z7902 Long term (current) use of antithrombotics/antiplatelets: Secondary | ICD-10-CM | POA: Diagnosis not present

## 2023-03-07 DIAGNOSIS — Z7189 Other specified counseling: Secondary | ICD-10-CM | POA: Diagnosis not present

## 2023-03-07 DIAGNOSIS — L22 Diaper dermatitis: Secondary | ICD-10-CM | POA: Diagnosis not present

## 2023-03-07 DIAGNOSIS — Z9189 Other specified personal risk factors, not elsewhere classified: Secondary | ICD-10-CM | POA: Diagnosis not present

## 2023-03-07 DIAGNOSIS — G8191 Hemiplegia, unspecified affecting right dominant side: Secondary | ICD-10-CM | POA: Diagnosis not present

## 2023-03-07 DIAGNOSIS — D329 Benign neoplasm of meninges, unspecified: Secondary | ICD-10-CM | POA: Diagnosis not present

## 2023-03-07 DIAGNOSIS — R1310 Dysphagia, unspecified: Secondary | ICD-10-CM | POA: Diagnosis not present

## 2023-03-07 DIAGNOSIS — J189 Pneumonia, unspecified organism: Secondary | ICD-10-CM | POA: Diagnosis not present

## 2023-03-07 DIAGNOSIS — G8929 Other chronic pain: Secondary | ICD-10-CM | POA: Diagnosis not present

## 2023-03-07 DIAGNOSIS — R4189 Other symptoms and signs involving cognitive functions and awareness: Secondary | ICD-10-CM | POA: Diagnosis not present

## 2023-03-07 DIAGNOSIS — J9611 Chronic respiratory failure with hypoxia: Secondary | ICD-10-CM | POA: Diagnosis not present

## 2023-03-07 DIAGNOSIS — I69351 Hemiplegia and hemiparesis following cerebral infarction affecting right dominant side: Secondary | ICD-10-CM | POA: Diagnosis not present

## 2023-03-07 DIAGNOSIS — I639 Cerebral infarction, unspecified: Secondary | ICD-10-CM | POA: Diagnosis not present

## 2023-03-07 DIAGNOSIS — I779 Disorder of arteries and arterioles, unspecified: Secondary | ICD-10-CM | POA: Diagnosis not present

## 2023-03-07 DIAGNOSIS — I69322 Dysarthria following cerebral infarction: Secondary | ICD-10-CM | POA: Diagnosis not present

## 2023-03-07 DIAGNOSIS — I739 Peripheral vascular disease, unspecified: Secondary | ICD-10-CM | POA: Diagnosis not present

## 2023-03-07 DIAGNOSIS — I69951 Hemiplegia and hemiparesis following unspecified cerebrovascular disease affecting right dominant side: Secondary | ICD-10-CM | POA: Diagnosis not present

## 2023-03-07 DIAGNOSIS — Z8616 Personal history of COVID-19: Secondary | ICD-10-CM | POA: Diagnosis not present

## 2023-03-07 DIAGNOSIS — U071 COVID-19: Secondary | ICD-10-CM | POA: Diagnosis not present

## 2023-03-07 DIAGNOSIS — I10 Essential (primary) hypertension: Secondary | ICD-10-CM | POA: Diagnosis not present

## 2023-03-07 DIAGNOSIS — I129 Hypertensive chronic kidney disease with stage 1 through stage 4 chronic kidney disease, or unspecified chronic kidney disease: Secondary | ICD-10-CM | POA: Diagnosis not present

## 2023-03-07 DIAGNOSIS — G459 Transient cerebral ischemic attack, unspecified: Secondary | ICD-10-CM | POA: Diagnosis not present

## 2023-03-07 DIAGNOSIS — S81802A Unspecified open wound, left lower leg, initial encounter: Secondary | ICD-10-CM | POA: Diagnosis not present

## 2023-03-07 DIAGNOSIS — K5904 Chronic idiopathic constipation: Secondary | ICD-10-CM | POA: Diagnosis not present

## 2023-03-07 DIAGNOSIS — Z7982 Long term (current) use of aspirin: Secondary | ICD-10-CM | POA: Diagnosis not present

## 2023-03-07 DIAGNOSIS — R42 Dizziness and giddiness: Secondary | ICD-10-CM | POA: Diagnosis not present

## 2023-03-07 DIAGNOSIS — N183 Chronic kidney disease, stage 3 unspecified: Secondary | ICD-10-CM | POA: Diagnosis not present

## 2023-03-07 DIAGNOSIS — F17210 Nicotine dependence, cigarettes, uncomplicated: Secondary | ICD-10-CM | POA: Diagnosis not present

## 2023-03-07 DIAGNOSIS — R2681 Unsteadiness on feet: Secondary | ICD-10-CM | POA: Diagnosis not present

## 2023-03-07 DIAGNOSIS — I150 Renovascular hypertension: Secondary | ICD-10-CM | POA: Diagnosis not present

## 2023-03-07 DIAGNOSIS — I63312 Cerebral infarction due to thrombosis of left middle cerebral artery: Secondary | ICD-10-CM | POA: Diagnosis not present

## 2023-03-07 DIAGNOSIS — F5101 Primary insomnia: Secondary | ICD-10-CM | POA: Diagnosis not present

## 2023-03-07 DIAGNOSIS — Z8673 Personal history of transient ischemic attack (TIA), and cerebral infarction without residual deficits: Secondary | ICD-10-CM | POA: Diagnosis not present

## 2023-03-07 DIAGNOSIS — I6789 Other cerebrovascular disease: Secondary | ICD-10-CM | POA: Diagnosis not present

## 2023-03-07 DIAGNOSIS — Z8619 Personal history of other infectious and parasitic diseases: Secondary | ICD-10-CM | POA: Diagnosis not present

## 2023-03-07 DIAGNOSIS — Z7401 Bed confinement status: Secondary | ICD-10-CM | POA: Diagnosis not present

## 2023-03-07 DIAGNOSIS — F17200 Nicotine dependence, unspecified, uncomplicated: Secondary | ICD-10-CM | POA: Diagnosis not present

## 2023-03-07 DIAGNOSIS — F319 Bipolar disorder, unspecified: Secondary | ICD-10-CM | POA: Diagnosis not present

## 2023-03-07 DIAGNOSIS — M6281 Muscle weakness (generalized): Secondary | ICD-10-CM | POA: Diagnosis not present

## 2023-03-07 MED ORDER — POLYETHYLENE GLYCOL 3350 17 G PO PACK: 34.0000 g | PACK | Freq: Every day | ORAL | 0 refills | Status: DC

## 2023-03-07 MED ORDER — SIMETHICONE 80 MG PO CHEW
80.0000 mg | CHEWABLE_TABLET | Freq: Four times a day (QID) | ORAL | Status: DC
  Administered 2023-03-07: 80 mg via ORAL
  Filled 2023-03-07: qty 1

## 2023-03-07 MED ORDER — SIMETHICONE 80 MG PO CHEW: 80.0000 mg | CHEWABLE_TABLET | Freq: Four times a day (QID) | ORAL | 0 refills | Status: AC

## 2023-03-07 MED ORDER — DOCUSATE SODIUM 100 MG PO CAPS: 100.0000 mg | ORAL_CAPSULE | Freq: Two times a day (BID) | ORAL | 0 refills | Status: DC

## 2023-03-07 MED ORDER — ACETAMINOPHEN 325 MG PO TABS: 650.0000 mg | ORAL_TABLET | Freq: Once | ORAL | Status: AC

## 2023-03-07 MED ORDER — POLYETHYLENE GLYCOL 3350 17 G PO PACK: 34.0000 g | PACK | Freq: Every day | ORAL | Status: DC

## 2023-03-07 NOTE — TOC Transition Note (Signed)
Transition of Care [facility]) - CM/SW Discharge Note   Patient Details  Name: [name] MRN: [id number] Date of Birth: Jul 31, 1946  Transition of Care [facility]) CM/SW Contact:  Baldemar Lenis, LCSW Phone Number: 03/07/2023, 9:58 AM   Clinical Narrative:   CSW received update that patient is agreeable to transition to SNF today. CSW updated [facility], they are ready to accept patient. Transport arranged with [facility] for next available.   Nurse to call report to [phone], Room 1205.   Final next level of care: Skilled Nursing Facility Barriers to Discharge: Barriers Resolved   Patient Goals and CMS Choice CMS Medicare.gov Compare Post Acute Care list provided to:: Patient Choice offered to / list presented to : Patient, Adult Children  Discharge Placement                Patient chooses bed at: [facility] Patient to be transferred to facility by: [facility] Name of family member notified: Self Patient and family notified of of transfer: 03/07/23  Discharge Plan and Services Additional resources added to the After Visit Summary for       Post Acute Care Choice: Skilled Nursing Facility                               Social Determinants of Health (SDOH) Interventions SDOH Screenings   Food Insecurity: No Food Insecurity (03/23/2021)  Transportation Needs: No Transportation Needs (03/23/2021)  Depression (PHQ2-9): High Risk (12/21/2022)  Financial Resource Strain: Low Risk  (03/23/2021)  Physical Activity: Inactive (03/23/2021)  Social Connections: Unknown (10/24/2018)  Tobacco Use: High Risk (02/15/2023)     Readmission Risk Interventions     No data to display

## 2023-03-07 NOTE — Plan of Care (Signed)
Pt alert and oriented x 4. Started shift with pt cursing at staff due to wanting to take meds from home exlax. Provider aware and equivalent med ordered. Upon returning pt was calm and cooperative but refused lovenox and provider aware. Tramdol given for back pain. Zofran given prior to meds due to nausea with medications. Melatonin given. Pt refused mobility and turning during the overnight NIH 6. Weakness drift continues on the right side.  Problem: Education: Goal: Knowledge of disease or condition will improve Outcome: Progressing Goal: Knowledge of secondary prevention will improve (MUST DOCUMENT ALL) Outcome: Progressing Goal: Knowledge of patient specific risk factors will improve Loraine Leriche N/A or DELETE if not current risk factor) Outcome: Progressing   Problem: Ischemic Stroke/TIA Tissue Perfusion: Goal: Complications of ischemic stroke/TIA will be minimized Outcome: Progressing   Problem: Coping: Goal: Will verbalize positive feelings about self Outcome: Progressing Goal: Will identify appropriate support needs Outcome: Progressing   Problem: Health Behavior/Discharge Planning: Goal: Ability to manage health-related needs will improve Outcome: Progressing Goal: Goals will be collaboratively established with patient/family Outcome: Progressing   Problem: Self-Care: Goal: Ability to participate in self-care as condition permits will improve Outcome: Progressing Goal: Verbalization of feelings and concerns over difficulty with self-care will improve Outcome: Progressing Goal: Ability to communicate needs accurately will improve Outcome: Progressing   Problem: Nutrition: Goal: Risk of aspiration will decrease Outcome: Progressing Goal: Dietary intake will improve Outcome: Progressing   Problem: Education: Goal: Knowledge of General Education information will improve Description: Including pain rating scale, medication(s)/side effects and non-pharmacologic comfort  measures Outcome: Progressing   Problem: Health Behavior/Discharge Planning: Goal: Ability to manage health-related needs will improve Outcome: Progressing   Problem: Clinical Measurements: Goal: Ability to maintain clinical measurements within normal limits will improve Outcome: Progressing Goal: Will remain free from infection Outcome: Progressing Goal: Diagnostic test results will improve Outcome: Progressing Goal: Respiratory complications will improve Outcome: Progressing Goal: Cardiovascular complication will be avoided Outcome: Progressing   Problem: Activity: Goal: Risk for activity intolerance will decrease Outcome: Progressing   Problem: Nutrition: Goal: Adequate nutrition will be maintained Outcome: Progressing   Problem: Coping: Goal: Level of anxiety will decrease Outcome: Progressing   Problem: Elimination: Goal: Will not experience complications related to bowel motility Outcome: Progressing Goal: Will not experience complications related to urinary retention Outcome: Progressing   Problem: Pain Managment: Goal: General experience of comfort will improve Outcome: Progressing   Problem: Safety: Goal: Ability to remain free from injury will improve Outcome: Progressing   Problem: Skin Integrity: Goal: Risk for impaired skin integrity will decrease Outcome: Progressing   Problem: Education: Goal: Knowledge of disease or condition will improve Outcome: Progressing Goal: Knowledge of secondary prevention will improve (MUST DOCUMENT ALL) Outcome: Progressing Goal: Knowledge of patient specific risk factors will improve Loraine Leriche N/A or DELETE if not current risk factor) Outcome: Progressing   Problem: Ischemic Stroke/TIA Tissue Perfusion: Goal: Complications of ischemic stroke/TIA will be minimized Outcome: Progressing   Problem: Coping: Goal: Will verbalize positive feelings about self Outcome: Progressing Goal: Will identify appropriate support  needs Outcome: Progressing   Problem: Health Behavior/Discharge Planning: Goal: Ability to manage health-related needs will improve Outcome: Progressing Goal: Goals will be collaboratively established with patient/family Outcome: Progressing   Problem: Self-Care: Goal: Ability to participate in self-care as condition permits will improve Outcome: Progressing Goal: Verbalization of feelings and concerns over difficulty with self-care will improve Outcome: Progressing Goal: Ability to communicate needs accurately will improve Outcome: Progressing   Problem: Nutrition: Goal: Risk  of aspiration will decrease Outcome: Progressing Goal: Dietary intake will improve Outcome: Progressing

## 2023-03-07 NOTE — Progress Notes (Signed)
Mobility Specialist Progress Note   03/07/23 1045  Mobility  Activity Turned to back - supine  Level of Assistance Standby assist, set-up cues, supervision of patient - no hands on  Assistive Device None  Range of Motion/Exercises Active;All extremities  Activity Response Tolerated well  Mobility Referral Yes  $Mobility charge 1 Mobility  Mobility Specialist Start Time (ACUTE ONLY) 1007  Mobility Specialist Stop Time (ACUTE ONLY) 1041  Mobility Specialist Time Calculation (min) (ACUTE ONLY) 34 min   Received pt in bed worried about transportation to SNF but after communication w/ LCSW pt more at ease. Pt deferring OOB mobility today but agreeable to bed level exercises, able tolerating exercises in all extremities w/ mild resistance and no complaints of pain. Pt presenting weaker on R side but able to initiative decent movement in upper and lower extremities. Left sitting in chair position w/ call bell in reach, needs met and bed alarm on.    [name] Mobility Specialist Please contact via SecureChat or  Rehab office at [phone]

## 2023-03-07 NOTE — Progress Notes (Incomplete)
Psychotherapy Progress Note [facility] Jenny Czar, PhD LP  Patient ID: [name])    MRN: [id number] Therapy format: Individual psychotherapy Date: 02/09/2023      Start: 4:09p     Stop: 4:58p     Time Spent: 49 min Location: Telehealth visit -- I connected with this patient by an approved telecommunication method (audio only), with her informed consent, and verifying identity and patient privacy.  I was located at my office and patient at her home.  As needed, we discussed the limitations, risks, and security and privacy concerns associated with telehealth service, including the availability and conditions which currently govern in-person appointments and the possibility that 3rd-party payment may not be fully guaranteed and she may be responsible for charges.  After she indicated understanding, we proceeded with the session.  Also discussed treatment planning, as needed, including ongoing verbal agreement with the plan, the opportunity to ask and answer all questions, her demonstrated understanding of instructions, and her readiness to call the office should symptoms worsen or she feels she is in a crisis state and needs more immediate and tangible assistance.   Session narrative (presenting needs, interim history, self-report of stressors and symptoms, applications of prior therapy, status changes, and interventions made in session) Phone session per schedule, made call 4:09p, went to voicemail, left message to return call when free.  Did so, glad to be in touch.  Has had to deal with widespread eczema and a stubborn bladder infection.  Worried about possible cancer, worried about BP.  Been feeling dizzy sometimes from high diastolics approaching 110.  Found a loud heart murmur, but was reassured by cardiologist, just tests to clarify.  Is off her blood thinner, not sure if it's correct, and increased BP med.  Needs dermatologist for her widespread skin problem.   Offered possibility her skin problem is the same one she began treating in ER a couple years back, and she has a cream for it.  Is monitoring her BP, says O2 is OK.  Drinks a good amount of ginger ale.  Encouraged she pay attention to carb load, as she could be feeding one or more adverse conditions from diabetes to fungal infection to arterial inflammation.  Meanwhile, she has made an affirmative housing decision.  Figures she'll take out a home equity loan [facility], sounds like) to pay some care expenses and start making arrangements to possibly downsize or relocate to an elder community.  Discussed options again, including hired home assistance, independent living (probably dormitory style) with available home assistance and dining plan.  Is aware that she set up a trust with son [name], not entirely sure what her rights and powers are to work with her home equity.  Advised get advice from the trust attorney.  Provided referrals for reputable home care agencies.  [name] is coming to visit next weekend.  Renewed the recommendation to work out an agreement ahead of time with him that they will collaborate on preventing fights, be willing to call and allow timeouts to cool down instead of indulging reactions to each other.  Therapeutic modalities: {AM:23362::"Cognitive Behavioral Therapy","Solution-Oriented/Positive Psychology"}  Mental Status/Observations:  Appearance:   {PSY:22683}     Behavior:  {PSY:21022743}  Motor:  {PSY:22302}  Speech/Language:   {PSY:22685}  Affect:  {PSY:22687}  Mood:  {PSY:31886}  Thought process:  {PSY:31888}  Thought content:    {[phone]}  Sensory/Perceptual disturbances:    {PSY:[phone]}  Orientation:  {Psych Orientation:23301::"Fully oriented"}  Attention:  {Good-Fair-Poor  ratings:23770::"Good"}    Concentration:  {Good-Fair-Poor ratings:23770::"Good"}  Memory:  {PSY:[phone]}  Insight:    {Good-Fair-Poor ratings:23770::"Good"}  Judgment:    {Good-Fair-Poor ratings:23770::"Good"}  Impulse Control:  {Good-Fair-Poor ratings:23770::"Good"}   Risk Assessment: Danger to Self: {Risk:22599::"No"} Self-injurious Behavior: {Risk:22599::"No"} Danger to Others: {Risk:22599::"No"} Physical Aggression / Violence: {Risk:22599::"No"} Duty to Warn: {AMYesNo:22526::"No"} Access to Firearms a concern: {AMYesNo:22526::"No"}  Assessment of progress:  {Progress:22147::"progressing"}  Diagnosis: No diagnosis found. Plan:  Self-care issues -- Prioritize personal health care over all concerns about family relationships, respect or lack thereof, and financial concerns.  Recommend engage a home care assistant.  Self-affirm that higher care can still be both necessary and affordable, if she will look into the facts of costs and her home equity.  Do not talk self out of engaging treatment and relocation based on imagined financial issues.  Collaborate with medical social worker as available through PCP's office for more help engaging the system.  Begin looking into options and costs for independent living, to include both an elder community or care facility and for-hire personal companion service.  For relocation both property manager to assess renting the house and independent living environments to assess costs and know how to she can get her needs met if health deteriorates.  Upon further evidence of failure to manage or risk of more serious self-neglect, call Adult Protective Services. Chronic health risks -- Abide by medical advice for chronic, serious conditions, maintain timely primary care, and seek help promptly for breathing issues or signs of cardiovascular or cerebrovascular issues.  Including but not limited to monitoring BP, monitoring O2, report signs and sxs promptly, apply topical remedies faithfully, and try to reduce carbs. Family relations -- Seek an agreement with [name] to collaborate against mutual tendency to react and spite each other, and use  timeout rather than backbiting, harshness, or verbal abuse.  Available to meet together with son if motivated. Cognitive impairment -- Monitor for deterioration, consequential delusions Other recommendations/advice -- As may be noted above.  Continue to utilize previously learned skills ad lib. Medication compliance -- Maintain medication as prescribed and work faithfully with relevant prescriber(s) if any changes are desired or seem indicated. Crisis service -- Aware of call list and work-in appts.  Call the clinic on-call service, 988/hotline, 911, or present to [facility] or ER if any life-threatening psychiatric crisis. Followup -- Return for time as already scheduled.  Next scheduled visit with me 03/02/2023.  Next scheduled in this office 03/02/2023.  Robley Fries, PhD Jenny Czar, PhD LP Clinical Psychologist, [facility] [address], [address] [place], [place] [address] [phone]

## 2023-03-07 NOTE — Discharge Summary (Signed)
Physician Discharge Summary   Patient: [name] MRN: [id number] DOB: 09-08-1946  Admit date:     03/03/2023  Discharge date: 03/07/23  Discharge Physician: Lynden Oxford  PCP: Shade Flood, MD  Recommendations at discharge: Continue aspirin 81 mg daily, Plavix 75 mg daily for 21 days then transition to aspirin alone.   Lipitor 40 mg daily Continue clobetasol ointment to the psoriatic rash twice daily, needs outpatient dermatology appointment Needs BP control.   Contact information for follow-up providers     [facility]. Schedule an appointment as soon as possible for a visit in 1 month(s).   Specialty: Neurology Why: stroke clinic Contact information: [address] [place] [address] [phone]        Shade Flood, MD. Schedule an appointment as soon as possible for a visit in 2 week(s).   Specialties: Family Medicine, Sports Medicine Why: for hospital follow-up Contact information: [address] [place] [address] [phone]              Contact information for after-discharge care     Destination     [facility], [facility] .   Service: Skilled Nursing Contact information: [address] [place] [address] [phone]                   Discharge Diagnoses: Principal Problem:   CVA (cerebral vascular accident) [facility]) Active Problems:   Hypertensive urgency   CHEST PAIN   Elevated troponin   CKD (chronic kidney disease), stage III (HCC)   HLD (hyperlipidemia)   Psoriasis   PVD (peripheral vascular disease) (HCC)   Depression   Meningioma (HCC)   Hypothyroid ?   History of CVA (cerebrovascular accident)   Debility   Tobacco abuse  Assessment and Plan  Acute CVA with right sided weakness, prior history of CVA in 2019 Presented with right arm and leg weakness, MRI revealed acute lacunar infarct of the left corona  radiator CTA head and neck showed no emergent finding, atherosclerosis with high-grade narrowing at the origin of nondominant left vertebral artery, advanced chronic small vessel ischemia -Neurology was consulted, recommended aspirin and Plavix for 21 days and then transition to aspirin alone --Lives alone, PT evaluation recommended SNF  - Lipid panel showed HDL 54, LDL 96, triglycerides 103, placed on Lipitor 40 mg daily. - Hemoglobin A1c 5.9 - 2D echo showed EF of 70 to 75%, severe LVH, diastolic function could not be evaluated, RV SF normal.   Hypertensive urgency Initial blood pressures elevated up to 209/95.   -Patient had been recently seen by cardiology and started on amlodipine, losartan however did not report taking medications regularly.   -Recommended BP control, risk factor modification, continue losartan, amlodipine   Atypical chest pain -Reported waking up around 9 PM the night before the admission with chest pain, resolved. -2D echo showed EF of 70 to 75%, severe LVH, no acute regional wall abnormalities   Chronic kidney disease stage IIIb Creatinine 1.3 which appears around patient's baseline. -Creatinine improved to 1.0   Hyperlipidemia -Lipid panel showed HDL 54, LDL 96, triglycerides 103. -Continue Lipitor 40 g daily   Psoriasis Patient with psoriatic rash of the hands upper and lower extremities.  Denied taking anything for treatment. -Clobetasol ointment to affected areas, outpatient follow-up with dermatology recommended   Peripheral vascular disease Patient noted have a prior history of atheroembolic disease for which she had been on Xarelto in  the past but stopped several years ago.  Not on any medications for treatment. -Continue aspirin, Plavix, and statin   Elevated TSH No prior diagnosis for hypothyroidism, TSH 4.7  Recommend to recheck in 1 month with PCP to see if she has developed hypothyroidism or not.     Major depressive  disorder Anxiety Patient appears to be followed in the outpatient setting by behavioral health, but reported not taking Lamictal and Cymbalta. -Continue ropinirole and melatonin   Idiopathic pulmonary fibrosis -Stable, O2 sats 95% on room air    Meningioma CT imaging noted small nonworrisome meningioma of the left vertex which was noted to be approximately 13 mm on MRI.   Tobacco abuse Patient reports smoking a pack cigarettes per day on average. -Counseled on need of cessation of tobacco use -Nicotine patch offered   Debility Patient got around with use of a rolling walker at baseline.  Constipation  Small BM yesterday per pt. Passing Occasional gas. No nasuea. Reports abdominal cramps. Nontender on exam. Will recommend to keep using home dose of miralax. She uses cup full of miralax at home.   Consultants:  Neurology   Procedures performed:  Echocardiogram   DISCHARGE MEDICATION: Allergies as of 03/07/2023   No Known Allergies      Medication List     STOP taking these medications    clonazePAM 1 MG tablet Commonly known as: KLONOPIN   triamcinolone cream 0.1 % Commonly known as: KENALOG       TAKE these medications    albuterol 108 (90 Base) MCG/ACT inhaler Commonly known as: Ventolin HFA Inhale 1-2 puffs into the lungs every 6 (six) hours as needed for wheezing or shortness of breath.   amLODipine 5 MG tablet Commonly known as: NORVASC Take 1 tablet (5 mg total) by mouth daily.   aspirin EC 81 MG tablet Take 81 mg by mouth daily. Swallow whole.   atorvastatin 40 MG tablet Commonly known as: LIPITOR Take 1 tablet (40 mg total) by mouth daily.   clobetasol ointment 0.05 % Commonly known as: TEMOVATE Apply topically 2 (two) times daily. Apply to affected areas with psoriatic rash   clopidogrel 75 MG tablet Commonly known as: PLAVIX Take 1 tablet (75 mg total) by mouth daily for 21 days.   docusate sodium 100 MG capsule Commonly known as:  Colace Take 1 capsule (100 mg total) by mouth 2 (two) times daily.   famotidine 20 MG tablet Commonly known as: PEPCID Take 20 mg by mouth daily as needed for indigestion.   losartan 25 MG tablet Commonly known as: COZAAR Take 1 tablet (25 mg total) by mouth daily.   Melatonin 10 MG Tabs Take 10 mg by mouth at bedtime as needed (for sleep).   nicotine 21 mg/24hr patch Commonly known as: NICODERM CQ - dosed in mg/24 hours Place 1 patch (21 mg total) onto the skin daily.   polyethylene glycol 17 g packet Commonly known as: MIRALAX / GLYCOLAX Take 34 g by mouth daily. Start taking on: March 08, 2023   rOPINIRole 2 MG tablet Commonly known as: REQUIP TAKE 1 TABLET(2 MG) BY MOUTH AT BEDTIME What changed: See the new instructions.   simethicone 80 MG chewable tablet Commonly known as: MYLICON Chew 1 tablet (80 mg total) by mouth 4 (four) times daily.       Disposition: SNF Diet recommendation: Cardiac diet  Discharge Exam: Vitals:   03/06/23 1939 03/06/23 2305 03/07/23 0323 03/07/23 0821  BP: 136/72 119/67 136/75 Marland Kitchen)  140/93  Pulse: 70 62 60 68  Resp: 18 16 16 20   Temp: 97.9 F (36.6 C) 97.9 F (36.6 C) 98.3 F (36.8 C) 97.8 F (36.6 C)  TempSrc: Oral Oral Oral Oral  SpO2: 97% 97% 94% 99%  Weight:      Height:       General: Appear in no distress; no visible Abnormal Neck Mass Or lumps, Conjunctiva normal Cardiovascular: S1 and S2 Present, no Murmur, Respiratory: good respiratory effort, Bilateral Air entry present and CTA, no Crackles, no wheezes Abdomen: Bowel Sound present, Non tender  Extremities: no Pedal edema Neurology: alert and oriented to time, place, and person right sided weakness Filed Weights   03/03/23 0330  Weight: 79.4 kg   Condition at discharge: stable  The results of significant diagnostics from this hospitalization (including imaging, microbiology, ancillary and laboratory) are listed below for reference.   Imaging  Studies: ECHOCARDIOGRAM COMPLETE  Result Date: 03/05/2023    ECHOCARDIOGRAM REPORT   Patient Name:   DUBLIN CANTERO Date of Exam: 03/05/2023 Medical Rec #:  [id number]      Height:       67.0 in Accession #:    [id number]     Weight:       175.0 lb Date of Birth:  January 03, 1946       BSA:          1.911 m Patient Age:    77 years       BP:           127/77 mmHg Patient Gender: F              HR:           65 bpm. Exam Location:  Inpatient Procedure: 2D Echo, Cardiac Doppler and Color Doppler Indications:    Stroke I63.9  History:        Patient has prior history of Echocardiogram examinations, most                 recent 11/18/2019. CAD, Stroke, Signs/Symptoms:Chest Pain; Risk                 Factors:Hypertension, Current Smoker and Dyslipidemia. CKD,                 stage 3.  Sonographer:    Lucendia Herrlich Referring Phys: [id number] RONDELL A SMITH  Sonographer Comments: Image acquisition challenging due to uncooperative patient. IMPRESSIONS  1. Left ventricular ejection fraction, by estimation, is 70 to 75%. The left ventricle has hyperdynamic function. The left ventricle has no regional wall motion abnormalities. There is severe left ventricular hypertrophy. Left ventricular diastolic function could not be evaluated.  2. Right ventricular systolic function is normal. The right ventricular size is normal.  3. A small pericardial effusion is present. There is no evidence of cardiac tamponade.  4. The mitral valve is grossly normal. Trivial mitral valve regurgitation.  5. The aortic valve is tricuspid. There is mild calcification of the aortic valve. There is mild thickening of the aortic valve. Aortic valve regurgitation is trivial. Aortic valve sclerosis is present, with no evidence of aortic valve stenosis. Comparison(s): No significant change from prior study. Conclusion(s)/Recommendation(s): Patient did not tolerate study--unable to get any views beyond parasternal windows. FINDINGS  Left Ventricle: Left ventricular  ejection fraction, by estimation, is 70 to 75%. The left ventricle has hyperdynamic function. The left ventricle has no regional wall motion abnormalities. The left ventricular internal cavity size was small. There is  severe left ventricular hypertrophy. Left ventricular diastolic function could not be evaluated. Right Ventricle: The right ventricular size is normal. Right vetricular wall thickness was not well visualized. Right ventricular systolic function is normal. Left Atrium: Left atrial size was not well visualized. Right Atrium: Right atrial size was not well visualized. Pericardium: A small pericardial effusion is present. There is no evidence of cardiac tamponade. Mitral Valve: The mitral valve is grossly normal. Trivial mitral valve regurgitation. Tricuspid Valve: The tricuspid valve is normal in structure. Tricuspid valve regurgitation is trivial. No evidence of tricuspid stenosis. Aortic Valve: The aortic valve is tricuspid. There is mild calcification of the aortic valve. There is mild thickening of the aortic valve. Aortic valve regurgitation is trivial. Aortic valve sclerosis is present, with no evidence of aortic valve stenosis. Pulmonic Valve: The pulmonic valve was not well visualized. Pulmonic valve regurgitation is not visualized. No evidence of pulmonic stenosis. Aorta: The aortic root and ascending aorta are structurally normal, with no evidence of dilitation. IAS/Shunts: The interatrial septum was not assessed.  LEFT VENTRICLE PLAX 2D LVIDd:         2.61 cm LVIDs:         1.36 cm LV PW:         1.60 cm LV IVS:        1.74 cm LVOT diam:     1.90 cm LVOT Area:     2.84 cm  LEFT ATRIUM         Index LA diam:    3.10 cm 1.62 cm/m   AORTA Ao Root diam: 3.30 cm Ao Asc diam:  3.50 cm TRICUSPID VALVE TR Peak grad:   13.2 mmHg TR Vmax:        182.00 cm/s  SHUNTS Systemic Diam: 1.90 cm Jodelle Red MD Electronically signed by Jodelle Red MD Signature Date/Time: 03/05/2023/10:41:07  AM    Final    MR BRAIN WO CONTRAST  Result Date: 03/03/2023 CLINICAL DATA:  Stroke follow-up.  Right arm and leg weakness. EXAM: MRI HEAD WITHOUT CONTRAST TECHNIQUE: Multiplanar, multiecho pulse sequences of the brain and surrounding structures were obtained without intravenous contrast. COMPARISON:  Head CT and CTA from earlier today FINDINGS: Brain: 1 cm restricted diffusion in the left corona radiata. There is a background of extensive chronic small vessel ischemia in the cerebral white matter with chronic right thalamic infarct and small bilateral cerebellar infarcts. Meningioma at the left vertex measuring 13 mm. Node significant mass effect on the brain. No hydrocephalus or collection. Vascular: Normal flow voids Skull and upper cervical spine: Normal marrow signal Sinuses/Orbits: Negative IMPRESSION: 1. Acute lacunar infarct in the left corona radiata. 2. Background of extensive chronic small vessel ischemia. 3. 13 mm meningioma at the left vertex. Electronically Signed   By: Tiburcio Pea M.D.   On: 03/03/2023 06:53   CT ANGIO HEAD NECK W WO CM  Result Date: 03/03/2023 CLINICAL DATA:  Stroke follow-up. Right-sided weakness involving the arm and leg EXAM: CT ANGIOGRAPHY HEAD AND NECK WITH AND WITHOUT CONTRAST TECHNIQUE: Multidetector CT imaging of the head and neck was performed using the standard protocol during bolus administration of intravenous contrast. Multiplanar CT image reconstructions and MIPs were obtained to evaluate the vascular anatomy. Carotid stenosis measurements (when applicable) are obtained utilizing NASCET criteria, using the distal internal carotid diameter as the denominator. RADIATION DOSE REDUCTION: This exam was performed according to the departmental dose-optimization program which includes automated exposure control, adjustment of the mA and/or kV according to patient  size and/or use of iterative reconstruction technique. CONTRAST:  75mL OMNIPAQUE IOHEXOL 350 MG/ML SOLN  COMPARISON:  10/05/2022 head CT FINDINGS: CT HEAD FINDINGS Brain: Extensive chronic small vessel ischemia in the cerebral white matter with confluent low-density. Chronic right thalamic infarct. Partially calcified meningioma at the left vertex measuring 12 mm. Vascular: No hyperdense vessel or unexpected calcification. Skull: Normal. Negative for fracture or focal lesion. Sinuses/Orbits: No acute finding. Review of the MIP images confirms the above findings CTA NECK FINDINGS Aortic arch: Atheromatous plaque with 2 vessel branching. No acute finding or dilatation Right carotid system: Diffuse atheromatous plaque, primarily calcified. No ulceration, beading, or flow limiting stenosis. Left carotid system: Diffuse atheromatous plaque, primarily calcified. No ulceration, beading, or flow limiting stenosis. Vertebral arteries: No proximal subclavian flow limiting stenosis, although there is atheromatous plaque on both sides. Right dominant vertebral artery. Vertebral tortuosity on both sides, plaque causes high-grade narrowing at the left vertebral origin based on coronal reformats, patent lumen not accurately measurable due to degree of plaque and small vessel size. Skeleton: Degenerative endplate and facet spurring. Other neck: Negative Upper chest: Biapical emphysema Review of the MIP images confirms the above findings CTA HEAD FINDINGS Anterior circulation: Atheromatous calcification especially at the carotid siphons. No significant stenosis, proximal occlusion, aneurysm, or vascular malformation. Posterior circulation: The vertebral and basilar arteries are smoothly contoured and widely patent. No branch occlusion, beading, or aneurysm. Venous sinuses: Diffusely patent as permitted by contrast timing Anatomic variants: Fetal type right PCA. Review of the MIP images confirms the above findings IMPRESSION: 1. No emergent finding. 2. Atherosclerosis with high-grade narrowing at the origin of the non dominant left  vertebral artery. 3. Advanced chronic small vessel ischemia. 4. Small and non worrisome meningioma at the left vertex. Electronically Signed   By: Tiburcio Pea M.D.   On: 03/03/2023 04:50   US Abdomen Limited RUQ (LIVER/GB)  Result Date: 02/13/2023 CLINICAL DATA:  Right upper quadrant pain EXAM: ULTRASOUND ABDOMEN LIMITED RIGHT UPPER QUADRANT COMPARISON:  CT from earlier in the same day. FINDINGS: Gallbladder: Gallstone is noted which was not well appreciated on prior CT examination. No wall thickening or pericholecystic fluid is noted. Common bile duct: Diameter: 4 mm mild intrahepatic ductal dilatation is seen similar to that noted on the prior CT examination although no obstructing lesion is seen. Liver: No focal lesion identified. Within normal limits in parenchymal echogenicity. Portal vein is patent on color Doppler imaging with normal direction of blood flow towards the liver. Other: None. IMPRESSION: Cholelithiasis without complicating factors. Mild intrahepatic ductal dilatation which may be within normal limits for the patient's age as no obstructing lesion is seen on recent CT examination. Correlate with laboratory values. Electronically Signed   By: Alcide Clever M.D.   On: 02/13/2023 19:38   CT CHEST ABDOMEN PELVIS W CONTRAST  Result Date: 02/13/2023 CLINICAL DATA:  Right upper quadrant pain and generalized weakness for 2 days, initial encounter EXAM: CT CHEST, ABDOMEN, AND PELVIS WITH CONTRAST TECHNIQUE: Multidetector CT imaging of the chest, abdomen and pelvis was performed following the standard protocol during bolus administration of intravenous contrast. RADIATION DOSE REDUCTION: This exam was performed according to the departmental dose-optimization program which includes automated exposure control, adjustment of the mA and/or kV according to patient size and/or use of iterative reconstruction technique. CONTRAST:  OMNIPAQUE IOHEXOL 300 MG/ML  SOLN COMPARISON:  Ultrasound from the  same day, CT from 10/05/2022 FINDINGS: CT CHEST FINDINGS Cardiovascular: Atherosclerotic calcifications of the thoracic aorta are noted.  No aneurysmal dilatation or dissection is seen. No cardiac enlargement is noted. Coronary calcifications are seen. No pulmonary emboli are noted although timing was not performed for embolus evaluation. Mediastinum/Nodes: Thoracic inlet is within normal limits. No hilar or mediastinal adenopathy is noted. The esophagus as visualized is within normal limits. Lungs/Pleura: Fibrotic changes are again identified within both lungs predominately in the subpleural location stable in appearance from the prior exam. Emphysematous changes are again seen. Stable 6 mm pulmonary nodule is noted in the right upper lobe best seen on image number 80 of series 3. second 3-4 mm nodule is noted within the right middle lobe best seen on image number 101 of series 3. No other sizable parenchymal nodules are seen. No effusion or pneumothorax is noted. Musculoskeletal: No acute rib abnormality is noted. Degenerative changes of the thoracic spine are seen. Calcified loose bodies are noted in the right shoulder joint. CT ABDOMEN PELVIS FINDINGS Hepatobiliary: Gallbladder is within normal limits. Liver shows no focal mass lesion. Minimal central biliary prominence is noted although no obstructing lesion or stone is seen. Pancreas: Unremarkable. No pancreatic ductal dilatation or surrounding inflammatory changes. Spleen: Normal in size without focal abnormality. Adrenals/Urinary Tract: Adrenal glands are within normal limits. Kidneys are well visualized bilaterally within normal enhancement pattern. No renal calculi or obstructive changes are noted. Multiple cysts are seen bilaterally. The largest of these measures 2.6 cm in the lower pole of the left kidney. They all appear simple in nature and no further follow-up is recommended. No obstructive changes are seen. The bladder is well distended.  Stomach/Bowel: Scattered fecal material is noted throughout the colon. No obstructive or inflammatory changes of the colon are seen. The appendix is not well visualized. No inflammatory changes to suggest appendicitis are noted. Small bowel and stomach are within normal limits. Vascular/Lymphatic: Aortic atherosclerosis. Mild aneurysmal dilatation of the infrarenal aorta to 3.2 cm is noted. No dissection is seen. No enlarged abdominal or pelvic lymph nodes. Reproductive: Uterus and bilateral adnexa are unremarkable. Other: No abdominal wall hernia or abnormality. No abdominopelvic ascites. Musculoskeletal: Degenerative changes of lumbar spine are noted. No acute bony abnormality is seen. IMPRESSION: CT of the chest: Stable pulmonary nodules when compared with the prior exam as well as prior exams dating back to 2021. Given their long-term stability no further follow-up is recommended. Chronic fibrotic and emphysematous changes. CT of the abdomen and pelvis: Abdominal aortic aneurysm measuring 3.2 cm infrarenal. Recommend follow-up every 3 years. Reference: J Am Coll Radiol 2013;10:789-794. No acute abnormality is noted in the abdomen and pelvis. Aortic Atherosclerosis (ICD10-I70.0) and Emphysema (ICD10-J43.9). Electronically Signed   By: Alcide Clever M.D.   On: 02/13/2023 19:37   DG Chest 2 View  Result Date: 02/13/2023 CLINICAL DATA:  Shortness of breath. Right upper quadrant pain for 2 days. EXAM: CHEST - 2 VIEW COMPARISON:  Chest radiographs 11/27/2022 and 10/05/2022; CT chest 10/05/2022 FINDINGS: Cardiac silhouette is again mildly enlarged. Dense atherosclerotic calcifications are again seen within aortic arch. Redemonstration of bilateral high-grade diffuse interstitial thickening and coarse reticular opacities consistent with known chronic lung disease. Mildly decreased lung volumes compared to most recent 11/27/2022 radiograph. Mildly increased densities overlying the bilateral lower lungs is favored to  represent bronchovascular crowding and crowding of the scarring rather than an acute airspace opacity. No pleural effusion pneumothorax. Moderate multilevel degenerative disc changes of the thoracic spine. IMPRESSION: 1. Redemonstration of bilateral high-grade diffuse interstitial thickening and coarse reticular opacities consistent with known chronic lung disease. 2.  Mildly decreased lung volumes. No definite acute airspace opacity. Electronically Signed   By: Neita Garnet M.D.   On: 02/13/2023 18:19    Microbiology: Results for orders placed or performed during the hospital encounter of 10/15/20  SARS CORONAVIRUS 2 (TAT 6-24 HRS) Nasopharyngeal Nasopharyngeal Swab     Status: None   Collection Time: 10/15/20  3:27 PM   Specimen: Nasopharyngeal Swab  Result Value Ref Range Status   SARS Coronavirus 2 NEGATIVE NEGATIVE Final    Comment: (NOTE) SARS-CoV-2 target nucleic acids are NOT DETECTED.  The SARS-CoV-2 RNA is generally detectable in upper and lower respiratory specimens during the acute phase of infection. Negative results do not preclude SARS-CoV-2 infection, do not rule out co-infections with other pathogens, and should not be used as the sole basis for treatment or other patient management decisions. Negative results must be combined with clinical observations, patient history, and epidemiological information. The expected result is Negative.  Fact Sheet for Patients: HairSlick.no  Fact Sheet for Healthcare Providers: quierodirigir.com  This test is not yet approved or cleared by the [place] FDA and  has been authorized for detection and/or diagnosis of SARS-CoV-2 by FDA under an Emergency Use Authorization (EUA). This EUA will remain  in effect (meaning this test can be used) for the duration of the COVID-19 declaration under Se ction 564(b)(1) of the Act, 21 U.S.C. section 360bbb-3(b)(1), unless the authorization  is terminated or revoked sooner.  Performed at [facility], [address] [place], [place] [address]    Labs: CBC: Recent Labs  Lab 03/03/23 [phone] 03/03/23 0346 03/04/23 0607  WBC 6.8  --  7.0  NEUTROABS 3.7  --   --   HGB 12.0 12.6 13.1  HCT 38.2 37.0 39.6  MCV 89.5  --  89.2  PLT 269  --  260   Basic Metabolic Panel: Recent Labs  Lab 03/03/23 0336 03/03/23 0346 03/04/23 0623  NA 138 138 136  K 3.5 3.6 3.5  CL 101 103 104  CO2 26  --  24  GLUCOSE 98 90 109*  BUN 22 25* 14  CREATININE 1.30* 1.30* 1.07*  CALCIUM 9.1  --  8.7*   Liver Function Tests: Recent Labs  Lab 03/03/23 0336  AST 15  ALT 12  ALKPHOS 71  BILITOT 0.4  PROT 6.5  ALBUMIN 3.0*   CBG: No results for input(s): "GLUCAP" in the last 168 hours.  Discharge time spent: greater than 30 minutes.  Author: Lynden Oxford, MD  [facility]

## 2023-03-08 ENCOUNTER — Ambulatory Visit (HOSPITAL_COMMUNITY): Payer: Medicare Other

## 2023-03-08 ENCOUNTER — Telehealth: Payer: Self-pay | Admitting: Psychiatry

## 2023-03-08 DIAGNOSIS — F319 Bipolar disorder, unspecified: Secondary | ICD-10-CM | POA: Diagnosis not present

## 2023-03-08 DIAGNOSIS — F39 Unspecified mood [affective] disorder: Secondary | ICD-10-CM | POA: Diagnosis not present

## 2023-03-08 NOTE — Telephone Encounter (Addendum)
Admin note for unbilled contact  Patient ID: [name]  MRN: [id number] DATE: 03/08/2023  Returned call from Pt revealing she is in hospital after a stroke.  Obviously weakened but speaking fluently and managing attention and concentration, c/o paresthesias and weakness right now.  Particular interest in referrals for rehab centers, based on prior discussions of retirement centers.  Says young, new-seeming social worker had tried to find a placement in her area (e.g., [facility]) but no available beds.  Offered limited knowledge but favorable reputation for [facility] rehab program.  Encouraged confer with social work and if she is stymied, suggest she ask her colleagues for help identifying suitable, available rehabs beyond her initial search.  Otherwise offered support, empathy, and encouragement to work her healing process and manage further stroke risks.  Dr. Jennelle Human informed, in c/o need to confer about meds.  Noted she doe not have services scheduled with either Dr. Jennelle Human or myself at this time.  Encouraged get to that when able.  Return as able, depending on course of recovery and rehab discharge plan.  Robley Fries, PhD Marliss Czar, PhD LP Clinical Psychologist, [facility] [address], [address] [place], [place] [address] [phone]

## 2023-03-12 DIAGNOSIS — F3130 Bipolar disorder, current episode depressed, mild or moderate severity, unspecified: Secondary | ICD-10-CM | POA: Diagnosis not present

## 2023-03-12 DIAGNOSIS — F4323 Adjustment disorder with mixed anxiety and depressed mood: Secondary | ICD-10-CM | POA: Diagnosis not present

## 2023-03-12 DIAGNOSIS — F5101 Primary insomnia: Secondary | ICD-10-CM | POA: Diagnosis not present

## 2023-03-14 ENCOUNTER — Ambulatory Visit: Payer: Medicare Other | Admitting: Neurology

## 2023-03-14 ENCOUNTER — Telehealth: Payer: Self-pay | Admitting: *Deleted

## 2023-03-14 DIAGNOSIS — J8410 Pulmonary fibrosis, unspecified: Secondary | ICD-10-CM | POA: Diagnosis not present

## 2023-03-14 DIAGNOSIS — K5904 Chronic idiopathic constipation: Secondary | ICD-10-CM | POA: Diagnosis not present

## 2023-03-14 DIAGNOSIS — F319 Bipolar disorder, unspecified: Secondary | ICD-10-CM | POA: Diagnosis not present

## 2023-03-14 DIAGNOSIS — J189 Pneumonia, unspecified organism: Secondary | ICD-10-CM | POA: Diagnosis not present

## 2023-03-14 DIAGNOSIS — Z7189 Other specified counseling: Secondary | ICD-10-CM | POA: Diagnosis not present

## 2023-03-14 NOTE — Telephone Encounter (Signed)
Left message for pt to call. She had an echo during her recent hospitalization and that was reviewed by dr hochrein. He would like to see the patient in one month.

## 2023-03-15 DIAGNOSIS — R2681 Unsteadiness on feet: Secondary | ICD-10-CM | POA: Diagnosis not present

## 2023-03-15 DIAGNOSIS — M6281 Muscle weakness (generalized): Secondary | ICD-10-CM | POA: Diagnosis not present

## 2023-03-15 DIAGNOSIS — I69951 Hemiplegia and hemiparesis following unspecified cerebrovascular disease affecting right dominant side: Secondary | ICD-10-CM | POA: Diagnosis not present

## 2023-03-15 DIAGNOSIS — I6789 Other cerebrovascular disease: Secondary | ICD-10-CM | POA: Diagnosis not present

## 2023-03-15 DIAGNOSIS — R41841 Cognitive communication deficit: Secondary | ICD-10-CM | POA: Diagnosis not present

## 2023-03-20 DIAGNOSIS — F3130 Bipolar disorder, current episode depressed, mild or moderate severity, unspecified: Secondary | ICD-10-CM | POA: Diagnosis not present

## 2023-03-20 DIAGNOSIS — F4323 Adjustment disorder with mixed anxiety and depressed mood: Secondary | ICD-10-CM | POA: Diagnosis not present

## 2023-03-22 DIAGNOSIS — R42 Dizziness and giddiness: Secondary | ICD-10-CM | POA: Diagnosis not present

## 2023-03-22 DIAGNOSIS — L22 Diaper dermatitis: Secondary | ICD-10-CM | POA: Diagnosis not present

## 2023-03-22 DIAGNOSIS — G4700 Insomnia, unspecified: Secondary | ICD-10-CM | POA: Diagnosis not present

## 2023-03-26 DIAGNOSIS — F4323 Adjustment disorder with mixed anxiety and depressed mood: Secondary | ICD-10-CM | POA: Diagnosis not present

## 2023-03-26 DIAGNOSIS — R2681 Unsteadiness on feet: Secondary | ICD-10-CM | POA: Diagnosis not present

## 2023-03-26 DIAGNOSIS — F5101 Primary insomnia: Secondary | ICD-10-CM | POA: Diagnosis not present

## 2023-03-26 DIAGNOSIS — R41841 Cognitive communication deficit: Secondary | ICD-10-CM | POA: Diagnosis not present

## 2023-03-26 DIAGNOSIS — M6281 Muscle weakness (generalized): Secondary | ICD-10-CM | POA: Diagnosis not present

## 2023-03-26 DIAGNOSIS — F3130 Bipolar disorder, current episode depressed, mild or moderate severity, unspecified: Secondary | ICD-10-CM | POA: Diagnosis not present

## 2023-03-26 DIAGNOSIS — I6789 Other cerebrovascular disease: Secondary | ICD-10-CM | POA: Diagnosis not present

## 2023-03-26 DIAGNOSIS — I69951 Hemiplegia and hemiparesis following unspecified cerebrovascular disease affecting right dominant side: Secondary | ICD-10-CM | POA: Diagnosis not present

## 2023-03-27 DIAGNOSIS — F39 Unspecified mood [affective] disorder: Secondary | ICD-10-CM | POA: Diagnosis not present

## 2023-03-27 DIAGNOSIS — F319 Bipolar disorder, unspecified: Secondary | ICD-10-CM | POA: Diagnosis not present

## 2023-03-27 DIAGNOSIS — I69322 Dysarthria following cerebral infarction: Secondary | ICD-10-CM | POA: Diagnosis not present

## 2023-03-27 DIAGNOSIS — I10 Essential (primary) hypertension: Secondary | ICD-10-CM | POA: Diagnosis not present

## 2023-03-28 DIAGNOSIS — I69951 Hemiplegia and hemiparesis following unspecified cerebrovascular disease affecting right dominant side: Secondary | ICD-10-CM | POA: Diagnosis not present

## 2023-03-28 DIAGNOSIS — I6789 Other cerebrovascular disease: Secondary | ICD-10-CM | POA: Diagnosis not present

## 2023-03-28 DIAGNOSIS — R41841 Cognitive communication deficit: Secondary | ICD-10-CM | POA: Diagnosis not present

## 2023-03-28 DIAGNOSIS — M6281 Muscle weakness (generalized): Secondary | ICD-10-CM | POA: Diagnosis not present

## 2023-03-28 DIAGNOSIS — R2681 Unsteadiness on feet: Secondary | ICD-10-CM | POA: Diagnosis not present

## 2023-03-30 ENCOUNTER — Other Ambulatory Visit: Payer: Self-pay | Admitting: Psychiatry

## 2023-03-30 DIAGNOSIS — G2581 Restless legs syndrome: Secondary | ICD-10-CM

## 2023-03-30 NOTE — Telephone Encounter (Signed)
Please call to schedule. Last seen in June with RTC in 2-3 months.

## 2023-03-30 NOTE — Telephone Encounter (Signed)
Called patient to verify how she was taking and she said she is taking 2 mg.

## 2023-04-02 ENCOUNTER — Ambulatory Visit: Payer: Medicare Other | Admitting: Internal Medicine

## 2023-04-02 DIAGNOSIS — R41841 Cognitive communication deficit: Secondary | ICD-10-CM | POA: Diagnosis not present

## 2023-04-02 DIAGNOSIS — I69951 Hemiplegia and hemiparesis following unspecified cerebrovascular disease affecting right dominant side: Secondary | ICD-10-CM | POA: Diagnosis not present

## 2023-04-02 DIAGNOSIS — R2681 Unsteadiness on feet: Secondary | ICD-10-CM | POA: Diagnosis not present

## 2023-04-02 DIAGNOSIS — R52 Pain, unspecified: Secondary | ICD-10-CM | POA: Diagnosis not present

## 2023-04-02 DIAGNOSIS — M6281 Muscle weakness (generalized): Secondary | ICD-10-CM | POA: Diagnosis not present

## 2023-04-02 DIAGNOSIS — I6789 Other cerebrovascular disease: Secondary | ICD-10-CM | POA: Diagnosis not present

## 2023-04-02 NOTE — Telephone Encounter (Signed)
Spoke to the pt at 10:47a.  She is a rehab facility due to having a stroke and cannot make appt at this time.

## 2023-04-03 NOTE — Telephone Encounter (Signed)
Left message for pt to call to make follow up appointment ?

## 2023-04-05 DIAGNOSIS — I6789 Other cerebrovascular disease: Secondary | ICD-10-CM | POA: Diagnosis not present

## 2023-04-05 DIAGNOSIS — R41841 Cognitive communication deficit: Secondary | ICD-10-CM | POA: Diagnosis not present

## 2023-04-05 DIAGNOSIS — J8410 Pulmonary fibrosis, unspecified: Secondary | ICD-10-CM | POA: Diagnosis not present

## 2023-04-05 DIAGNOSIS — Z7982 Long term (current) use of aspirin: Secondary | ICD-10-CM | POA: Diagnosis not present

## 2023-04-05 DIAGNOSIS — R52 Pain, unspecified: Secondary | ICD-10-CM | POA: Diagnosis not present

## 2023-04-05 DIAGNOSIS — M6281 Muscle weakness (generalized): Secondary | ICD-10-CM | POA: Diagnosis not present

## 2023-04-05 DIAGNOSIS — Z9189 Other specified personal risk factors, not elsewhere classified: Secondary | ICD-10-CM | POA: Diagnosis not present

## 2023-04-05 DIAGNOSIS — R4189 Other symptoms and signs involving cognitive functions and awareness: Secondary | ICD-10-CM | POA: Diagnosis not present

## 2023-04-05 DIAGNOSIS — G4700 Insomnia, unspecified: Secondary | ICD-10-CM | POA: Diagnosis not present

## 2023-04-05 DIAGNOSIS — I69951 Hemiplegia and hemiparesis following unspecified cerebrovascular disease affecting right dominant side: Secondary | ICD-10-CM | POA: Diagnosis not present

## 2023-04-05 DIAGNOSIS — I150 Renovascular hypertension: Secondary | ICD-10-CM | POA: Diagnosis not present

## 2023-04-05 DIAGNOSIS — Z8701 Personal history of pneumonia (recurrent): Secondary | ICD-10-CM | POA: Diagnosis not present

## 2023-04-05 DIAGNOSIS — N183 Chronic kidney disease, stage 3 unspecified: Secondary | ICD-10-CM | POA: Diagnosis not present

## 2023-04-05 DIAGNOSIS — F319 Bipolar disorder, unspecified: Secondary | ICD-10-CM | POA: Diagnosis not present

## 2023-04-05 DIAGNOSIS — R2681 Unsteadiness on feet: Secondary | ICD-10-CM | POA: Diagnosis not present

## 2023-04-05 DIAGNOSIS — Z8673 Personal history of transient ischemic attack (TIA), and cerebral infarction without residual deficits: Secondary | ICD-10-CM | POA: Diagnosis not present

## 2023-04-06 DIAGNOSIS — F3130 Bipolar disorder, current episode depressed, mild or moderate severity, unspecified: Secondary | ICD-10-CM | POA: Diagnosis not present

## 2023-04-06 DIAGNOSIS — F4323 Adjustment disorder with mixed anxiety and depressed mood: Secondary | ICD-10-CM | POA: Diagnosis not present

## 2023-04-09 DIAGNOSIS — I69951 Hemiplegia and hemiparesis following unspecified cerebrovascular disease affecting right dominant side: Secondary | ICD-10-CM | POA: Diagnosis not present

## 2023-04-09 DIAGNOSIS — I6789 Other cerebrovascular disease: Secondary | ICD-10-CM | POA: Diagnosis not present

## 2023-04-09 DIAGNOSIS — M6281 Muscle weakness (generalized): Secondary | ICD-10-CM | POA: Diagnosis not present

## 2023-04-09 DIAGNOSIS — R41841 Cognitive communication deficit: Secondary | ICD-10-CM | POA: Diagnosis not present

## 2023-04-09 DIAGNOSIS — R52 Pain, unspecified: Secondary | ICD-10-CM | POA: Diagnosis not present

## 2023-04-09 DIAGNOSIS — F4323 Adjustment disorder with mixed anxiety and depressed mood: Secondary | ICD-10-CM | POA: Diagnosis not present

## 2023-04-09 DIAGNOSIS — F5101 Primary insomnia: Secondary | ICD-10-CM | POA: Diagnosis not present

## 2023-04-09 DIAGNOSIS — R2681 Unsteadiness on feet: Secondary | ICD-10-CM | POA: Diagnosis not present

## 2023-04-09 DIAGNOSIS — F3130 Bipolar disorder, current episode depressed, mild or moderate severity, unspecified: Secondary | ICD-10-CM | POA: Diagnosis not present

## 2023-04-10 NOTE — Telephone Encounter (Signed)
Left message for pt to call to schedule follow up appointment. 

## 2023-04-11 ENCOUNTER — Encounter: Payer: Self-pay | Admitting: Cardiology

## 2023-04-11 DIAGNOSIS — R41841 Cognitive communication deficit: Secondary | ICD-10-CM | POA: Diagnosis not present

## 2023-04-11 DIAGNOSIS — I69951 Hemiplegia and hemiparesis following unspecified cerebrovascular disease affecting right dominant side: Secondary | ICD-10-CM | POA: Diagnosis not present

## 2023-04-11 DIAGNOSIS — R52 Pain, unspecified: Secondary | ICD-10-CM | POA: Diagnosis not present

## 2023-04-11 DIAGNOSIS — R2681 Unsteadiness on feet: Secondary | ICD-10-CM | POA: Diagnosis not present

## 2023-04-11 DIAGNOSIS — I6789 Other cerebrovascular disease: Secondary | ICD-10-CM | POA: Diagnosis not present

## 2023-04-11 DIAGNOSIS — M6281 Muscle weakness (generalized): Secondary | ICD-10-CM | POA: Diagnosis not present

## 2023-04-16 DIAGNOSIS — R52 Pain, unspecified: Secondary | ICD-10-CM | POA: Diagnosis not present

## 2023-04-16 DIAGNOSIS — I6789 Other cerebrovascular disease: Secondary | ICD-10-CM | POA: Diagnosis not present

## 2023-04-16 DIAGNOSIS — R41841 Cognitive communication deficit: Secondary | ICD-10-CM | POA: Diagnosis not present

## 2023-04-16 DIAGNOSIS — I69951 Hemiplegia and hemiparesis following unspecified cerebrovascular disease affecting right dominant side: Secondary | ICD-10-CM | POA: Diagnosis not present

## 2023-04-16 DIAGNOSIS — R2681 Unsteadiness on feet: Secondary | ICD-10-CM | POA: Diagnosis not present

## 2023-04-16 DIAGNOSIS — M6281 Muscle weakness (generalized): Secondary | ICD-10-CM | POA: Diagnosis not present

## 2023-04-17 ENCOUNTER — Telehealth: Payer: Self-pay | Admitting: Family Medicine

## 2023-04-17 DIAGNOSIS — F4323 Adjustment disorder with mixed anxiety and depressed mood: Secondary | ICD-10-CM | POA: Diagnosis not present

## 2023-04-17 DIAGNOSIS — I10 Essential (primary) hypertension: Secondary | ICD-10-CM | POA: Diagnosis not present

## 2023-04-17 DIAGNOSIS — G8929 Other chronic pain: Secondary | ICD-10-CM | POA: Diagnosis not present

## 2023-04-17 DIAGNOSIS — S81802A Unspecified open wound, left lower leg, initial encounter: Secondary | ICD-10-CM | POA: Diagnosis not present

## 2023-04-17 DIAGNOSIS — F3130 Bipolar disorder, current episode depressed, mild or moderate severity, unspecified: Secondary | ICD-10-CM | POA: Diagnosis not present

## 2023-04-17 DIAGNOSIS — F319 Bipolar disorder, unspecified: Secondary | ICD-10-CM | POA: Diagnosis not present

## 2023-04-17 DIAGNOSIS — F39 Unspecified mood [affective] disorder: Secondary | ICD-10-CM | POA: Diagnosis not present

## 2023-04-17 NOTE — Telephone Encounter (Signed)
Caller name: ALVENIA VARELLA  On DPR?: Yes  Call back number: [phone] (mobile)  Provider they see: Shade Flood, MD  Reason for call:  Pt would like a call from Neva Seat or CMA wouldn't say about what

## 2023-04-17 NOTE — Telephone Encounter (Signed)
Spoke with patient and we are arranging an appointment so she can be seen post Stroke

## 2023-04-17 NOTE — Telephone Encounter (Signed)
Sent message with a few open dates and times to see what will work with her schedule then patient will call back

## 2023-04-19 NOTE — Telephone Encounter (Signed)
Left message for pt to call.

## 2023-04-20 DIAGNOSIS — I69951 Hemiplegia and hemiparesis following unspecified cerebrovascular disease affecting right dominant side: Secondary | ICD-10-CM | POA: Diagnosis not present

## 2023-04-20 DIAGNOSIS — R2681 Unsteadiness on feet: Secondary | ICD-10-CM | POA: Diagnosis not present

## 2023-04-20 DIAGNOSIS — R41841 Cognitive communication deficit: Secondary | ICD-10-CM | POA: Diagnosis not present

## 2023-04-20 DIAGNOSIS — I6789 Other cerebrovascular disease: Secondary | ICD-10-CM | POA: Diagnosis not present

## 2023-04-20 DIAGNOSIS — M6281 Muscle weakness (generalized): Secondary | ICD-10-CM | POA: Diagnosis not present

## 2023-04-20 DIAGNOSIS — R52 Pain, unspecified: Secondary | ICD-10-CM | POA: Diagnosis not present

## 2023-04-23 DIAGNOSIS — R41841 Cognitive communication deficit: Secondary | ICD-10-CM | POA: Diagnosis not present

## 2023-04-23 DIAGNOSIS — I6789 Other cerebrovascular disease: Secondary | ICD-10-CM | POA: Diagnosis not present

## 2023-04-23 DIAGNOSIS — I69951 Hemiplegia and hemiparesis following unspecified cerebrovascular disease affecting right dominant side: Secondary | ICD-10-CM | POA: Diagnosis not present

## 2023-04-23 DIAGNOSIS — R2681 Unsteadiness on feet: Secondary | ICD-10-CM | POA: Diagnosis not present

## 2023-04-23 DIAGNOSIS — R52 Pain, unspecified: Secondary | ICD-10-CM | POA: Diagnosis not present

## 2023-04-23 DIAGNOSIS — M6281 Muscle weakness (generalized): Secondary | ICD-10-CM | POA: Diagnosis not present

## 2023-04-24 DIAGNOSIS — F39 Unspecified mood [affective] disorder: Secondary | ICD-10-CM | POA: Diagnosis not present

## 2023-04-24 DIAGNOSIS — G4700 Insomnia, unspecified: Secondary | ICD-10-CM | POA: Diagnosis not present

## 2023-04-24 DIAGNOSIS — S81802A Unspecified open wound, left lower leg, initial encounter: Secondary | ICD-10-CM | POA: Diagnosis not present

## 2023-04-25 ENCOUNTER — Inpatient Hospital Stay: Payer: Medicare Other | Admitting: Adult Health

## 2023-04-25 ENCOUNTER — Encounter: Payer: Self-pay | Admitting: Adult Health

## 2023-04-25 VITALS — BP 156/77 | HR 66 | Ht 67.0 in

## 2023-04-25 DIAGNOSIS — I639 Cerebral infarction, unspecified: Secondary | ICD-10-CM | POA: Diagnosis not present

## 2023-04-25 DIAGNOSIS — M6281 Muscle weakness (generalized): Secondary | ICD-10-CM | POA: Diagnosis not present

## 2023-04-25 DIAGNOSIS — G8191 Hemiplegia, unspecified affecting right dominant side: Secondary | ICD-10-CM | POA: Diagnosis not present

## 2023-04-25 DIAGNOSIS — I6789 Other cerebrovascular disease: Secondary | ICD-10-CM | POA: Diagnosis not present

## 2023-04-25 DIAGNOSIS — R41841 Cognitive communication deficit: Secondary | ICD-10-CM | POA: Diagnosis not present

## 2023-04-25 DIAGNOSIS — R52 Pain, unspecified: Secondary | ICD-10-CM | POA: Diagnosis not present

## 2023-04-25 DIAGNOSIS — I69951 Hemiplegia and hemiparesis following unspecified cerebrovascular disease affecting right dominant side: Secondary | ICD-10-CM | POA: Diagnosis not present

## 2023-04-25 DIAGNOSIS — R2681 Unsteadiness on feet: Secondary | ICD-10-CM | POA: Diagnosis not present

## 2023-04-25 MED ORDER — ROSUVASTATIN CALCIUM 20 MG PO TABS: 20.0000 mg | ORAL_TABLET | Freq: Every day | ORAL | 5 refills | Status: DC

## 2023-04-25 NOTE — Patient Instructions (Addendum)
Continue working with therapy for hopeful ongoing recovery  Please restart patient on melatonin to help with sleep - was taking prior with benefit  Continue aspirin 81 mg daily and recommend switching atorvastatin to rosuvastatin per patient request (reports difficulty tolerating atorvastatin) for secondary stroke prevention  Continue to follow up with PCP regarding blood pressure and cholesterol management  Maintain strict control of hypertension with blood pressure goal below 130/90 and cholesterol with LDL cholesterol (bad cholesterol) goal below 70 mg/dL.   Signs of a Stroke? Follow the BEFAST method:  Balance Watch for a sudden loss of balance, trouble with coordination or vertigo Eyes Is there a sudden loss of vision in one or both eyes? Or double vision?  Face: Ask the person to smile. Does one side of the face droop or is it numb?  Arms: Ask the person to raise both arms. Does one arm drift downward? Is there weakness or numbness of a leg? Speech: Ask the person to repeat a simple phrase. Does the speech sound slurred/strange? Is the person confused ? Time: If you observe any of these signs, call 911.     No further recommendations at this time, recommend follow up as needed     Thank you for coming to see [place] at [facility]. I hope we have been able to provide you high quality care today.  You may receive a patient satisfaction survey over the next few weeks. We would appreciate your feedback and comments so that we may continue to improve ourselves and the health of our patients.    Stroke Prevention Some medical conditions and lifestyle choices can lead to a higher risk for a stroke. You can help to prevent a stroke by eating healthy foods and exercising. It also helps to not smoke and to manage any health problems you may have. How can this condition affect me? A stroke is an emergency. It should be treated right away. A stroke can lead to brain damage  or threaten your life. There is a better chance of surviving and getting better after a stroke if you get medical help right away. What can increase my risk? The following medical conditions may increase your risk of a stroke: Diseases of the heart and blood vessels (cardiovascular disease). High blood pressure (hypertension). Diabetes. High cholesterol. Sickle cell disease. Problems with blood clotting. Being very overweight. Sleeping problems (obstructivesleep apnea). Other risk factors include: Being older than age 45. A history of blood clots, stroke, or mini-stroke (TIA). Race, ethnic background, or a family history of stroke. Smoking or using tobacco products. Taking birth control pills, especially if you smoke. Heavy alcohol and drug use. Not being active. What actions can I take to prevent this? Manage your health conditions High cholesterol. Eat a healthy diet. If this is not enough to manage your cholesterol, you may need to take medicines. Take medicines as told by your doctor. High blood pressure. Try to keep your blood pressure below 130/80. If your blood pressure cannot be managed through a healthy diet and regular exercise, you may need to take medicines. Take medicines as told by your doctor. Ask your doctor if you should check your blood pressure at home. Have your blood pressure checked every year. Diabetes. Eat a healthy diet and get regular exercise. If your blood sugar (glucose) cannot be managed through diet and exercise, you may need to take medicines. Take medicines as told by your doctor. Talk to your doctor about getting checked for sleeping  problems. Signs of a problem can include: Snoring a lot. Feeling very tired. Make sure that you manage any other conditions you have. Nutrition  Follow instructions from your doctor about what to eat or drink. You may be told to: Eat and drink fewer calories each day. Limit how much salt (sodium) you use to 1,500  milligrams (mg) each day. Use only healthy fats for cooking, such as olive oil, canola oil, and sunflower oil. Eat healthy foods. To do this: Choose foods that are high in fiber. These include whole grains, and fresh fruits and vegetables. Eat at least 5 servings of fruits and vegetables a day. Try to fill one-half of your plate with fruits and vegetables at each meal. Choose low-fat (lean) proteins. These include low-fat cuts of meat, chicken without skin, fish, tofu, beans, and nuts. Eat low-fat dairy products. Avoid foods that: Are high in salt. Have saturated fat. Have trans fat. Have cholesterol. Are processed or pre-made. Count how many carbohydrates you eat and drink each day. Lifestyle If you drink alcohol: Limit how much you have to: 0-1 drink a day for women who are not pregnant. 0-2 drinks a day for men. Know how much alcohol is in your drink. In the [place] one drink equals one 12 oz bottle of beer ( ), one 5 oz glass of wine ( ), or one 1 oz glass of hard liquor (44mL). Do not smoke or use any products that have nicotine or tobacco. If you need help quitting, ask your doctor. Avoid secondhand smoke. Do not use drugs. Activity  Try to stay at a healthy weight. Get at least 30 minutes of exercise on most days, such as: Fast walking. Biking. Swimming. Medicines Take over-the-counter and prescription medicines only as told by your doctor. Avoid taking birth control pills. Talk to your doctor about the risks of taking birth control pills if: You are over 53 years old. You smoke. You get very bad headaches. You have had a blood clot. Where to find more information American Stroke Association: www.strokeassociation.org Get help right away if: You or a loved one has any signs of a stroke. "BE FAST" is an easy way to remember the warning signs: B - Balance. Dizziness, sudden trouble walking, or loss of balance. E - Eyes. Trouble seeing or a change in how you  see. F - Face. Sudden weakness or loss of feeling of the face. The face or eyelid may droop on one side. A - Arms. Weakness or loss of feeling in an arm. This happens all of a sudden and most often on one side of the body. S - Speech. Sudden trouble speaking, slurred speech, or trouble understanding what people say. T - Time. Time to call emergency services. Write down what time symptoms started. You or a loved one has other signs of a stroke, such as: A sudden, very bad headache with no known cause. Feeling like you may vomit (nausea). Vomiting. A seizure. These symptoms may be an emergency. Get help right away. Call your local emergency services (911 in the [place].S[place] Do not wait to see if the symptoms will go away. Do not drive yourself to the hospital. Summary You can help to prevent a stroke by eating healthy, exercising, and not smoking. It also helps to manage any health problems you have. Do not smoke or use any products that contain nicotine or tobacco. Get help right away if you or a loved one has any signs of a stroke. This information is not  intended to replace advice given to you by your health care provider. Make sure you discuss any questions you have with your health care provider. Document Revised: 07/31/2022 Document Reviewed: 07/31/2022 Elsevier Patient Education  2024 [employer].

## 2023-04-25 NOTE — Progress Notes (Signed)
[facility] [address] [place].  [address] [phone]       HOSPITAL FOLLOW UP NOTE  [name] Date of Birth:  [birth year] Medical Record Number:  [id number]   Reason for Referral:  hospital stroke follow up    SUBJECTIVE:   CHIEF COMPLAINT:  Chief Complaint  Patient presents with   Follow-up    Rm 8, here alone Pt is here for hospital follow up after stroke. Pt states she is doing well. States she still has residual weakness. States she is doing PT.     HPI:   [name] is a 77 y.o. female with PMHx of HTN, hypothyroidism, meningioma, PAD, CKD IIIb, psoriasis, depression, bipolar disorder, tobacco smoker, and history of CVA who presented to the ED on 03/03/2023 for evaluation of right-sided weakness and chest pain and hypertensive urgency. MRI imaging revealed an acute lacunar infarct of the left corona radiata.  Etiology likely small vessel disease.  Recommended DAPT for 3 weeks and aspirin alone.  Initiated atorvastatin 40 mg daily, LDL 96.  Patient recently started on amlodipine and losartan by cardiology but reports not taking regularly, advised to start medications and follow-up outpatient for ongoing monitoring.  Tobacco cessation counseling provided.  Therapies recommended SNF.    Today, 04/25/2023, patient is being seen for hospital follow-up unaccompanied.  Currently residing at [facility]. Reports doing well overall since discharge.  Right-sided weakness and gait difficulty but has been gradually improving.  Currently working with therapies.  Ambulates short distance with rolling walker during PT only otherwise use of w/c.  She also has some mild short-term memory difficulties.  Denies new stroke/TIA symptoms.  She has been considering transitioning to independent or assisted living facility after rehab stay completed.   Per SNF MAR, completed 3 weeks DAPT and remains on aspirin alone as well as atorvastatin. Patient reports  difficulty tolerating atorvastatin as she believes it has been causing nausea and questions other statin.  Blood pressure elevated today but occasionally monitored at facility typically 120-130s/70-80.  She has not yet scheduled follow-up visit with PCP Dr. Neva Seat.  Declines current tobacco use.  She voices multiple frustrations regarding her current stay at [facility] and feels like she isn't being well cared for, she has hired a Programmer, systems for 2-3 days per week to assist with ADLs.       PERTINENT IMAGING  CT head old right thalamic and bilateral BG infarcts.  Small and non worrisome meningioma at the left vertex. CTA head & neck No emergent finding. Atherosclerosis with high-grade narrowing at origin of non dominant left vertebral artery. MRI   Acute lacunar infarct in the left corona radiata. Background of extensive chronic small vessel ischemia. 13 mm meningioma at the left vertex. 2D Echo EF 70 to 75% LDL 96 HgbA1c 5.9    ROS:   14 system review of systems performed and negative with exception of those listed in HPI  PMH:  Past Medical History:  Diagnosis Date   AAA (abdominal aortic aneurysm) (HCC)    3.1 cm 07/08/18, 3 year follow-up recommended; possible 3 cm AAA by aortogram 09/13/18   Anemia    PMH   Appendicitis with abscess    07/08/18, s/p perc drain; resolved 07/30/18 by CT   Arthritis    Bipolar disorder (HCC)    Cerebrovascular disease    intra and extracranial vascular dx per MRI 4/11, neurology rec strict CVRF control   Colonic inertia    Constipation  chronic;severe   Depression    Duodenitis    Gastritis    GERD (gastroesophageal reflux disease)    Hypertension    Hypothyroid 01/16/2014   Meningioma (HCC) 10/21/2013   PAD (peripheral artery disease) (HCC)    Psoriasis    sees derm   Wears dentures    Wears glasses     PSH:  Past Surgical History:  Procedure Laterality Date   ABDOMINAL AORTOGRAM W/LOWER EXTREMITY N/A 09/13/2018   Procedure:  ABDOMINAL AORTOGRAM W/LOWER EXTREMITY;  Surgeon: Sherren Kerns, MD;  Location: [facility] INVASIVE CV LAB;  Service: Cardiovascular;  Laterality: N/A;   arthroscopy  04/2010   Right knee   CATARACT EXTRACTION W/ INTRAOCULAR LENS  IMPLANT, BILATERAL     COLONOSCOPY     FEMORAL-POPLITEAL BYPASS GRAFT  10/22/2018   FEMORAL-POPLITEAL BYPASS GRAFT Left 10/22/2018   Procedure: LEFT FEMORAL TO BELOW THE KNEE POPLITEAL ARTERY BYPASS GRAFT;  Surgeon: Sherren Kerns, MD;  Location: [facility] OR;  Service: Vascular;  Laterality: Left;   I & D EXTREMITY Left 11/08/2018   Procedure: IRRIGATION AND DEBRIDEMENT EXTREMITY Left Leg;  Surgeon: Sherren Kerns, MD;  Location: [facility] OR;  Service: Vascular;  Laterality: Left;   IR RADIOLOGIST EVAL & MGMT  07/30/2018   MULTIPLE TOOTH EXTRACTIONS     TUBAL LIGATION      Social History:  Social History   Socioeconomic History   Marital status: Widowed    Spouse name: Not on file   Number of children: 1   Years of education: Not on file   Highest education level: Not on file  Occupational History   Occupation: retired  Tobacco Use   Smoking status: Every Day    Current packs/day: 0.00    Average packs/day: 0.5 packs/day for 60.0 years (30.0 ttl pk-yrs)    Types: Cigarettes    Start date: 11/10/1959    Last attempt to quit: 11/10/2019    Years since quitting: 3.4   Smokeless tobacco: Never  Vaping Use   Vaping status: Former  Substance and Sexual Activity   Alcohol use: Yes    Alcohol/week: 0.0 standard drinks of alcohol    Comment: yes on occassion   Drug use: No   Sexual activity: Not Currently  Other Topics Concern   Not on file  Social History Narrative   Brother in law Mr [place] Neighbor   One son in [place].    Social Determinants of Health   Financial Resource Strain: Low Risk  (03/23/2021)   Overall Financial Resource Strain (CARDIA)    Difficulty of Paying Living Expenses: Not hard at all  Food Insecurity: No Food Insecurity (03/23/2021)   Hunger  Vital Sign    Worried About Running Out of Food in the Last Year: Never true    Ran Out of Food in the Last Year: Never true  Transportation Needs: No Transportation Needs (03/23/2021)   PRAPARE - Administrator, Civil Service (Medical): No    Lack of Transportation (Non-Medical): No  Physical Activity: Inactive (03/23/2021)   Exercise Vital Sign    Days of Exercise per Week: 0 days    Minutes of Exercise per Session: 0 min  Stress: Not on file  Social Connections: Unknown (10/24/2018)   Social Connection and Isolation Panel [NHANES]    Frequency of Communication with Friends and Family: Patient declined    Frequency of Social Gatherings with Friends and Family: Patient declined    Attends Religious Services: Patient declined  Active Member of Clubs or Organizations: Patient declined    Attends Banker Meetings: Patient declined    Marital Status: Patient declined  Intimate Partner Violence: Unknown (10/24/2018)   Humiliation, Afraid, Rape, and Kick questionnaire    Fear of Current or Ex-Partner: Patient declined    Emotionally Abused: Patient declined    Physically Abused: Patient declined    Sexually Abused: Patient declined    Family History:  Family History  Problem Relation Age of Onset   Breast cancer Mother        metastisis to bones   Cancer Sister 80       spinal   Alcohol abuse Brother    Heart disease Brother    Lung cancer Brother        smoked   Diabetes Son    Heart disease Maternal Aunt    Heart disease Other        grandfather    Colon cancer Neg Hx    Esophageal cancer Neg Hx    Stomach cancer Neg Hx     Medications:   Current Outpatient Medications on File Prior to Visit  Medication Sig Dispense Refill   amLODipine (NORVASC) 5 MG tablet Take 1 tablet (5 mg total) by mouth daily. 90 tablet 3   aspirin EC 81 MG tablet Take 81 mg by mouth daily. Swallow whole.     atorvastatin (LIPITOR) 40 MG tablet Take 1 tablet (40 mg total)  by mouth daily.     clobetasol ointment (TEMOVATE) 0.05 % Apply topically 2 (two) times daily. Apply to affected areas with psoriatic rash 30 g 0   clopidogrel (PLAVIX) 75 MG tablet Take 75 mg by mouth daily.     docusate sodium (COLACE) 100 MG capsule Take 1 capsule (100 mg total) by mouth 2 (two) times daily. 10 capsule 0   famotidine (PEPCID) 20 MG tablet Take 20 mg by mouth daily as needed for indigestion.     lamoTRIgine (LAMICTAL) 25 MG tablet Take 25 mg by mouth daily.     Melatonin 10 MG TABS Take 10 mg by mouth at bedtime as needed (for sleep).     nicotine (NICODERM CQ - DOSED IN MG/24 HOURS) 21 mg/24hr patch Place 1 patch (21 mg total) onto the skin daily. 28 patch 0   polyethylene glycol (MIRALAX / GLYCOLAX) 17 g packet Take 34 g by mouth daily. 14 each 0   ramelteon (ROZEREM) 8 MG tablet Take 8 mg by mouth at bedtime.     rOPINIRole (REQUIP) 2 MG tablet TAKE 1 TABLET(2 MG) BY MOUTH AT BEDTIME 60 tablet 0   simethicone (MYLICON) 80 MG chewable tablet Chew 1 tablet (80 mg total) by mouth 4 (four) times daily. 30 tablet 0   albuterol (VENTOLIN HFA) 108 (90 Base) MCG/ACT inhaler Inhale 1-2 puffs into the lungs every 6 (six) hours as needed for wheezing or shortness of breath. (Patient not taking: Reported on 04/25/2023) 18 g 2   losartan (COZAAR) 25 MG tablet Take 1 tablet (25 mg total) by mouth daily. (Patient not taking: Reported on 04/25/2023) 90 tablet 3   No current facility-administered medications on file prior to visit.    Allergies:  No Known Allergies    OBJECTIVE:  Physical Exam  Vitals:   04/25/23 1004  BP: (!) 156/77  Pulse: 66  Height: 5\' 7"  (1.702 m)   Body mass index is 27.41 kg/m. No results found.  General: well developed, well nourished, pleasant elderly Caucasian  female, seated, in no evident distress Head: head normocephalic and atraumatic.   Neck: supple with no carotid or supraclavicular bruits Cardiovascular: regular rate and rhythm, no  murmurs Musculoskeletal: no deformity Skin:  no rash/petichiae Vascular:  Normal pulses all extremities   Neurologic Exam Mental Status: Awake and fully alert.  Fluent speech and language.  Oriented to place and time. Recent mildly impaired and remote memory intact. Attention span, concentration and fund of knowledge appropriate. Mood and affect appropriate.  Cranial Nerves: Fundoscopic exam reveals sharp disc margins. Pupils equal, briskly reactive to light. Extraocular movements full without nystagmus. Visual fields full to confrontation. Hearing intact. Facial sensation intact. Face, tongue, palate moves normally and symmetrically.  Motor: Normal strength, bulk and tone left upper and lower extremity mild HF weakness RUE: 4-/5 deltoid, 4/4 elbow flexion and extension, 4-/5 grip and decreased hand dexterity RLE: 4/5 throughout Sensory.:  Decreased light touch sensation right upper and lower extremity compared to left side Coordination: Rapid alternating movements normal in all extremities except mildly impaired RUE. Finger-to-nose and heel-to-shin difficulty performing RUE and RLE and LLE Gait and Station: Deferred Reflexes: 1+ and symmetric. Toes downgoing.     NIHSS  5 Modified Rankin  4      ASSESSMENT: [name] is a 77 y.o. year old female with left CR stroke on 03/03/2023 secondary to small vessel disease. Vascular risk factors include hx of prior stroke with noncompliance with stroke prophylaxis, long-term tobacco use, PAD, HTN, HLD, CAD and advanced age.      PLAN:  L CR stroke :  Residual deficit: Right hemiparesis with sensory impairment.  Continue working with therapies at SNF, discussed typical recovery time.  Is hopeful to transition to independent or assisted living facility once rehab completed Continue aspirin 81mg  daily and will switch atorvastatin (Lipitor) to rosuvastatin (per patient request) for secondary stroke prevention.  Discussed importance of taking  medications lifelong unless contraindicated in the future Discussed importance of refraining from tobacco use as ongoing use can increase risk of additional strokes and cardiovascular disease Discussed secondary stroke prevention measures and importance of close PCP follow up for aggressive stroke risk factor management including BP goal<130/90, HLD with LDL goal<70 and DM with A1c.<7 .  Stroke labs 02/2023: LDL 96, A1c 5.9 I have gone over the pathophysiology of stroke, warning signs and symptoms, risk factors and their management in some detail with instructions to go to the closest emergency room for symptoms of concern.    No further recommendations from stroke standpoint at this time, can continue to follow with PCP for stroke risk factor management.  Can follow-up in office as needed   CC:  [facility] provider: Dr. Pearlean Brownie PCP: Shade Flood, MD    I spent 59 minutes of face-to-face and non-face-to-face time with patient.  This included previsit chart review including review of recent hospitalization, lab review, study review, order entry, electronic health record documentation, patient education regarding recent stroke including etiology, secondary stroke prevention measures and importance of managing stroke risk factors, residual deficits and typical recovery time and answered all other questions to patient satisfaction   Ihor Austin, AGNP-BC  [facility] [address] [place], [place] [address]  Phone [phone] Fax [phone] Note: This document was prepared with digital dictation and possible smart phrase technology. Any transcriptional errors that result from this process are unintentional.

## 2023-04-26 NOTE — Progress Notes (Signed)
I agree with the above plan 

## 2023-04-30 DIAGNOSIS — Z7902 Long term (current) use of antithrombotics/antiplatelets: Secondary | ICD-10-CM | POA: Diagnosis not present

## 2023-04-30 DIAGNOSIS — I714 Abdominal aortic aneurysm, without rupture, unspecified: Secondary | ICD-10-CM | POA: Diagnosis not present

## 2023-04-30 DIAGNOSIS — I129 Hypertensive chronic kidney disease with stage 1 through stage 4 chronic kidney disease, or unspecified chronic kidney disease: Secondary | ICD-10-CM | POA: Diagnosis not present

## 2023-04-30 DIAGNOSIS — Z7982 Long term (current) use of aspirin: Secondary | ICD-10-CM | POA: Diagnosis not present

## 2023-04-30 DIAGNOSIS — G4700 Insomnia, unspecified: Secondary | ICD-10-CM | POA: Diagnosis not present

## 2023-04-30 DIAGNOSIS — R41841 Cognitive communication deficit: Secondary | ICD-10-CM | POA: Diagnosis not present

## 2023-04-30 DIAGNOSIS — M6281 Muscle weakness (generalized): Secondary | ICD-10-CM | POA: Diagnosis not present

## 2023-04-30 DIAGNOSIS — E785 Hyperlipidemia, unspecified: Secondary | ICD-10-CM | POA: Diagnosis not present

## 2023-04-30 DIAGNOSIS — N183 Chronic kidney disease, stage 3 unspecified: Secondary | ICD-10-CM | POA: Diagnosis not present

## 2023-04-30 DIAGNOSIS — L409 Psoriasis, unspecified: Secondary | ICD-10-CM | POA: Diagnosis not present

## 2023-04-30 DIAGNOSIS — Z8616 Personal history of COVID-19: Secondary | ICD-10-CM | POA: Diagnosis not present

## 2023-04-30 DIAGNOSIS — J8410 Pulmonary fibrosis, unspecified: Secondary | ICD-10-CM | POA: Diagnosis not present

## 2023-04-30 DIAGNOSIS — R4189 Other symptoms and signs involving cognitive functions and awareness: Secondary | ICD-10-CM | POA: Diagnosis not present

## 2023-04-30 DIAGNOSIS — R2681 Unsteadiness on feet: Secondary | ICD-10-CM | POA: Diagnosis not present

## 2023-04-30 DIAGNOSIS — I69322 Dysarthria following cerebral infarction: Secondary | ICD-10-CM | POA: Diagnosis not present

## 2023-04-30 DIAGNOSIS — K5904 Chronic idiopathic constipation: Secondary | ICD-10-CM | POA: Diagnosis not present

## 2023-04-30 DIAGNOSIS — F17210 Nicotine dependence, cigarettes, uncomplicated: Secondary | ICD-10-CM | POA: Diagnosis not present

## 2023-04-30 DIAGNOSIS — U071 COVID-19: Secondary | ICD-10-CM | POA: Diagnosis not present

## 2023-04-30 DIAGNOSIS — I6789 Other cerebrovascular disease: Secondary | ICD-10-CM | POA: Diagnosis not present

## 2023-04-30 DIAGNOSIS — F319 Bipolar disorder, unspecified: Secondary | ICD-10-CM | POA: Diagnosis not present

## 2023-04-30 DIAGNOSIS — I69351 Hemiplegia and hemiparesis following cerebral infarction affecting right dominant side: Secondary | ICD-10-CM | POA: Diagnosis not present

## 2023-04-30 DIAGNOSIS — I69951 Hemiplegia and hemiparesis following unspecified cerebrovascular disease affecting right dominant side: Secondary | ICD-10-CM | POA: Diagnosis not present

## 2023-05-01 DIAGNOSIS — S81802A Unspecified open wound, left lower leg, initial encounter: Secondary | ICD-10-CM | POA: Diagnosis not present

## 2023-05-02 DIAGNOSIS — R2681 Unsteadiness on feet: Secondary | ICD-10-CM | POA: Diagnosis not present

## 2023-05-02 DIAGNOSIS — I69951 Hemiplegia and hemiparesis following unspecified cerebrovascular disease affecting right dominant side: Secondary | ICD-10-CM | POA: Diagnosis not present

## 2023-05-02 DIAGNOSIS — M6281 Muscle weakness (generalized): Secondary | ICD-10-CM | POA: Diagnosis not present

## 2023-05-02 DIAGNOSIS — Z8616 Personal history of COVID-19: Secondary | ICD-10-CM | POA: Diagnosis not present

## 2023-05-02 DIAGNOSIS — R41841 Cognitive communication deficit: Secondary | ICD-10-CM | POA: Diagnosis not present

## 2023-05-02 DIAGNOSIS — I6789 Other cerebrovascular disease: Secondary | ICD-10-CM | POA: Diagnosis not present

## 2023-05-07 DIAGNOSIS — R41841 Cognitive communication deficit: Secondary | ICD-10-CM | POA: Diagnosis not present

## 2023-05-07 DIAGNOSIS — Z8616 Personal history of COVID-19: Secondary | ICD-10-CM | POA: Diagnosis not present

## 2023-05-07 DIAGNOSIS — I6789 Other cerebrovascular disease: Secondary | ICD-10-CM | POA: Diagnosis not present

## 2023-05-07 DIAGNOSIS — I69951 Hemiplegia and hemiparesis following unspecified cerebrovascular disease affecting right dominant side: Secondary | ICD-10-CM | POA: Diagnosis not present

## 2023-05-07 DIAGNOSIS — R2681 Unsteadiness on feet: Secondary | ICD-10-CM | POA: Diagnosis not present

## 2023-05-07 DIAGNOSIS — M6281 Muscle weakness (generalized): Secondary | ICD-10-CM | POA: Diagnosis not present

## 2023-05-08 DIAGNOSIS — S81802A Unspecified open wound, left lower leg, initial encounter: Secondary | ICD-10-CM | POA: Diagnosis not present

## 2023-05-08 DIAGNOSIS — B3731 Acute candidiasis of vulva and vagina: Secondary | ICD-10-CM | POA: Diagnosis not present

## 2023-05-08 DIAGNOSIS — G4700 Insomnia, unspecified: Secondary | ICD-10-CM | POA: Diagnosis not present

## 2023-05-09 DIAGNOSIS — I6789 Other cerebrovascular disease: Secondary | ICD-10-CM | POA: Diagnosis not present

## 2023-05-09 DIAGNOSIS — I69951 Hemiplegia and hemiparesis following unspecified cerebrovascular disease affecting right dominant side: Secondary | ICD-10-CM | POA: Diagnosis not present

## 2023-05-09 DIAGNOSIS — R2681 Unsteadiness on feet: Secondary | ICD-10-CM | POA: Diagnosis not present

## 2023-05-09 DIAGNOSIS — M6281 Muscle weakness (generalized): Secondary | ICD-10-CM | POA: Diagnosis not present

## 2023-05-09 DIAGNOSIS — R41841 Cognitive communication deficit: Secondary | ICD-10-CM | POA: Diagnosis not present

## 2023-05-09 DIAGNOSIS — Z8616 Personal history of COVID-19: Secondary | ICD-10-CM | POA: Diagnosis not present

## 2023-05-15 DIAGNOSIS — S81802A Unspecified open wound, left lower leg, initial encounter: Secondary | ICD-10-CM | POA: Diagnosis not present

## 2023-05-16 DIAGNOSIS — R2681 Unsteadiness on feet: Secondary | ICD-10-CM | POA: Diagnosis not present

## 2023-05-16 DIAGNOSIS — Z8616 Personal history of COVID-19: Secondary | ICD-10-CM | POA: Diagnosis not present

## 2023-05-16 DIAGNOSIS — I69951 Hemiplegia and hemiparesis following unspecified cerebrovascular disease affecting right dominant side: Secondary | ICD-10-CM | POA: Diagnosis not present

## 2023-05-16 DIAGNOSIS — R41841 Cognitive communication deficit: Secondary | ICD-10-CM | POA: Diagnosis not present

## 2023-05-16 DIAGNOSIS — I6789 Other cerebrovascular disease: Secondary | ICD-10-CM | POA: Diagnosis not present

## 2023-05-16 DIAGNOSIS — M6281 Muscle weakness (generalized): Secondary | ICD-10-CM | POA: Diagnosis not present

## 2023-05-18 DIAGNOSIS — R41841 Cognitive communication deficit: Secondary | ICD-10-CM | POA: Diagnosis not present

## 2023-05-18 DIAGNOSIS — J8410 Pulmonary fibrosis, unspecified: Secondary | ICD-10-CM | POA: Diagnosis not present

## 2023-05-18 DIAGNOSIS — I69951 Hemiplegia and hemiparesis following unspecified cerebrovascular disease affecting right dominant side: Secondary | ICD-10-CM | POA: Diagnosis not present

## 2023-05-18 DIAGNOSIS — E785 Hyperlipidemia, unspecified: Secondary | ICD-10-CM | POA: Diagnosis not present

## 2023-05-18 DIAGNOSIS — Z8616 Personal history of COVID-19: Secondary | ICD-10-CM | POA: Diagnosis not present

## 2023-05-18 DIAGNOSIS — F319 Bipolar disorder, unspecified: Secondary | ICD-10-CM | POA: Diagnosis not present

## 2023-05-18 DIAGNOSIS — I69322 Dysarthria following cerebral infarction: Secondary | ICD-10-CM | POA: Diagnosis not present

## 2023-05-18 DIAGNOSIS — I714 Abdominal aortic aneurysm, without rupture, unspecified: Secondary | ICD-10-CM | POA: Diagnosis not present

## 2023-05-18 DIAGNOSIS — I6789 Other cerebrovascular disease: Secondary | ICD-10-CM | POA: Diagnosis not present

## 2023-05-18 DIAGNOSIS — Z8619 Personal history of other infectious and parasitic diseases: Secondary | ICD-10-CM | POA: Diagnosis not present

## 2023-05-18 DIAGNOSIS — K5904 Chronic idiopathic constipation: Secondary | ICD-10-CM | POA: Diagnosis not present

## 2023-05-18 DIAGNOSIS — Z7982 Long term (current) use of aspirin: Secondary | ICD-10-CM | POA: Diagnosis not present

## 2023-05-18 DIAGNOSIS — N183 Chronic kidney disease, stage 3 unspecified: Secondary | ICD-10-CM | POA: Diagnosis not present

## 2023-05-18 DIAGNOSIS — I129 Hypertensive chronic kidney disease with stage 1 through stage 4 chronic kidney disease, or unspecified chronic kidney disease: Secondary | ICD-10-CM | POA: Diagnosis not present

## 2023-05-18 DIAGNOSIS — R2681 Unsteadiness on feet: Secondary | ICD-10-CM | POA: Diagnosis not present

## 2023-05-18 DIAGNOSIS — M6281 Muscle weakness (generalized): Secondary | ICD-10-CM | POA: Diagnosis not present

## 2023-05-18 DIAGNOSIS — D329 Benign neoplasm of meninges, unspecified: Secondary | ICD-10-CM | POA: Diagnosis not present

## 2023-05-18 DIAGNOSIS — I739 Peripheral vascular disease, unspecified: Secondary | ICD-10-CM | POA: Diagnosis not present

## 2023-05-21 ENCOUNTER — Telehealth: Payer: Self-pay | Admitting: Family Medicine

## 2023-05-21 NOTE — Telephone Encounter (Signed)
Home Health   Agency:  [facility]   Caller: Annabelle Harman  Call back #: [phone] Fax #:    Requesting Delay of start PT and OT

## 2023-05-21 NOTE — Telephone Encounter (Signed)
Noted - ok to delay as noted.

## 2023-05-21 NOTE — Telephone Encounter (Signed)
[name] reports delay until tomorrow. Sent to Dr.Greene for [facility]

## 2023-05-22 ENCOUNTER — Encounter: Payer: Self-pay | Admitting: *Deleted

## 2023-05-22 DIAGNOSIS — G4700 Insomnia, unspecified: Secondary | ICD-10-CM | POA: Diagnosis not present

## 2023-05-22 DIAGNOSIS — I714 Abdominal aortic aneurysm, without rupture, unspecified: Secondary | ICD-10-CM | POA: Diagnosis not present

## 2023-05-22 DIAGNOSIS — F17210 Nicotine dependence, cigarettes, uncomplicated: Secondary | ICD-10-CM | POA: Diagnosis not present

## 2023-05-22 DIAGNOSIS — I69322 Dysarthria following cerebral infarction: Secondary | ICD-10-CM | POA: Diagnosis not present

## 2023-05-22 DIAGNOSIS — I779 Disorder of arteries and arterioles, unspecified: Secondary | ICD-10-CM | POA: Diagnosis not present

## 2023-05-22 DIAGNOSIS — I739 Peripheral vascular disease, unspecified: Secondary | ICD-10-CM | POA: Diagnosis not present

## 2023-05-22 DIAGNOSIS — I69351 Hemiplegia and hemiparesis following cerebral infarction affecting right dominant side: Secondary | ICD-10-CM | POA: Diagnosis not present

## 2023-05-22 DIAGNOSIS — K219 Gastro-esophageal reflux disease without esophagitis: Secondary | ICD-10-CM | POA: Diagnosis not present

## 2023-05-22 DIAGNOSIS — D329 Benign neoplasm of meninges, unspecified: Secondary | ICD-10-CM | POA: Diagnosis not present

## 2023-05-22 DIAGNOSIS — L409 Psoriasis, unspecified: Secondary | ICD-10-CM | POA: Diagnosis not present

## 2023-05-22 DIAGNOSIS — B3731 Acute candidiasis of vulva and vagina: Secondary | ICD-10-CM | POA: Diagnosis not present

## 2023-05-22 DIAGNOSIS — K5904 Chronic idiopathic constipation: Secondary | ICD-10-CM | POA: Diagnosis not present

## 2023-05-22 DIAGNOSIS — E039 Hypothyroidism, unspecified: Secondary | ICD-10-CM | POA: Diagnosis not present

## 2023-05-22 DIAGNOSIS — I150 Renovascular hypertension: Secondary | ICD-10-CM | POA: Diagnosis not present

## 2023-05-22 DIAGNOSIS — M1990 Unspecified osteoarthritis, unspecified site: Secondary | ICD-10-CM | POA: Diagnosis not present

## 2023-05-22 DIAGNOSIS — N183 Chronic kidney disease, stage 3 unspecified: Secondary | ICD-10-CM | POA: Diagnosis not present

## 2023-05-22 DIAGNOSIS — I129 Hypertensive chronic kidney disease with stage 1 through stage 4 chronic kidney disease, or unspecified chronic kidney disease: Secondary | ICD-10-CM | POA: Diagnosis not present

## 2023-05-22 DIAGNOSIS — F39 Unspecified mood [affective] disorder: Secondary | ICD-10-CM | POA: Diagnosis not present

## 2023-05-22 DIAGNOSIS — D631 Anemia in chronic kidney disease: Secondary | ICD-10-CM | POA: Diagnosis not present

## 2023-05-22 DIAGNOSIS — F319 Bipolar disorder, unspecified: Secondary | ICD-10-CM | POA: Diagnosis not present

## 2023-05-22 NOTE — Telephone Encounter (Signed)
Letter mailed to the patiwent asking her to call and make a follow up appointment.

## 2023-05-23 ENCOUNTER — Other Ambulatory Visit: Payer: Self-pay

## 2023-05-23 ENCOUNTER — Telehealth: Payer: Self-pay | Admitting: Family Medicine

## 2023-05-23 DIAGNOSIS — I10 Essential (primary) hypertension: Secondary | ICD-10-CM

## 2023-05-23 NOTE — Telephone Encounter (Signed)
HH has been notified  Would you like me to set up a time to call her over the phone.

## 2023-05-23 NOTE — Telephone Encounter (Signed)
Spoke with [name] and he would like to get a pt in for virtual appt. Pt reports she can not come in to office. Pt has concerns of medication and B/P 158/76 Pt states she has access to camera

## 2023-05-23 NOTE — Telephone Encounter (Signed)
Noted appointment on the 16th, I am okay with verbal order for home health orders.  Looks like she was most recently seen by neurology on August 14 can verify medications with her over the phone if possible prior to my visit next week.

## 2023-05-23 NOTE — Telephone Encounter (Signed)
See prior messages, is this something you would be able to do, to verify her medications compared to what she was taking at most recent neuro visit?  She has an appointment with me next week but apparently she has some confusion on what medicine she is supposed to be taking.

## 2023-05-23 NOTE — Telephone Encounter (Signed)
Home Health   Agency: [facility]  Caller: Dorion  Call back: [phone] Fax #:    Requesting PT:    Reason for Request:   F/U medication rec - patient doesn't know what or when to take meds. Just discharged doesn't from SNF and doesn't have all meds prescribed and is confused.   Give Dorion or pt a call after appt  Frequency:     HH needs F2F w/in last 30 days

## 2023-05-23 NOTE — Telephone Encounter (Signed)
Called and spoke with Brother in law who assists with medication and it is noted she is only taking Aspirine 81 mg, Losartan 25 mh am, Amlodipine 5 mg pm, ropinirole in evenings. No other medication is active.

## 2023-05-24 NOTE — Telephone Encounter (Signed)
Last neurology note reviewed.  Difficulty tolerating prior statin but was changed to Crestor, would recommend that she try that new prescription.  In regards to psychiatric meds, this should be discussed with her psychiatrist.  Can discuss further at follow-up visit scheduled next week.

## 2023-05-25 DIAGNOSIS — I69351 Hemiplegia and hemiparesis following cerebral infarction affecting right dominant side: Secondary | ICD-10-CM | POA: Diagnosis not present

## 2023-05-25 DIAGNOSIS — D329 Benign neoplasm of meninges, unspecified: Secondary | ICD-10-CM | POA: Diagnosis not present

## 2023-05-25 DIAGNOSIS — F319 Bipolar disorder, unspecified: Secondary | ICD-10-CM | POA: Diagnosis not present

## 2023-05-25 DIAGNOSIS — F39 Unspecified mood [affective] disorder: Secondary | ICD-10-CM | POA: Diagnosis not present

## 2023-05-25 DIAGNOSIS — I779 Disorder of arteries and arterioles, unspecified: Secondary | ICD-10-CM | POA: Diagnosis not present

## 2023-05-25 DIAGNOSIS — I739 Peripheral vascular disease, unspecified: Secondary | ICD-10-CM | POA: Diagnosis not present

## 2023-05-28 ENCOUNTER — Telehealth (INDEPENDENT_AMBULATORY_CARE_PROVIDER_SITE_OTHER): Payer: Medicare Other | Admitting: Family Medicine

## 2023-05-28 VITALS — BP 135/85

## 2023-05-28 DIAGNOSIS — I10 Essential (primary) hypertension: Secondary | ICD-10-CM

## 2023-05-28 DIAGNOSIS — J84112 Idiopathic pulmonary fibrosis: Secondary | ICD-10-CM

## 2023-05-28 DIAGNOSIS — Z8673 Personal history of transient ischemic attack (TIA), and cerebral infarction without residual deficits: Secondary | ICD-10-CM | POA: Diagnosis not present

## 2023-05-28 DIAGNOSIS — I6381 Other cerebral infarction due to occlusion or stenosis of small artery: Secondary | ICD-10-CM

## 2023-05-28 DIAGNOSIS — I779 Disorder of arteries and arterioles, unspecified: Secondary | ICD-10-CM | POA: Diagnosis not present

## 2023-05-28 DIAGNOSIS — I69351 Hemiplegia and hemiparesis following cerebral infarction affecting right dominant side: Secondary | ICD-10-CM | POA: Diagnosis not present

## 2023-05-28 DIAGNOSIS — F39 Unspecified mood [affective] disorder: Secondary | ICD-10-CM | POA: Diagnosis not present

## 2023-05-28 DIAGNOSIS — F319 Bipolar disorder, unspecified: Secondary | ICD-10-CM | POA: Diagnosis not present

## 2023-05-28 DIAGNOSIS — I739 Peripheral vascular disease, unspecified: Secondary | ICD-10-CM | POA: Diagnosis not present

## 2023-05-28 DIAGNOSIS — D329 Benign neoplasm of meninges, unspecified: Secondary | ICD-10-CM | POA: Diagnosis not present

## 2023-05-28 MED ORDER — ALBUTEROL SULFATE HFA 108 (90 BASE) MCG/ACT IN AERS
1.0000 | INHALATION_SPRAY | Freq: Four times a day (QID) | RESPIRATORY_TRACT | 2 refills | Status: DC | PRN
Start: 2023-05-28 — End: 2023-05-31

## 2023-05-28 MED ORDER — LOVASTATIN 20 MG PO TABS
20.0000 mg | ORAL_TABLET | Freq: Every day | ORAL | 3 refills | Status: DC
Start: 2023-05-28 — End: 2023-05-31

## 2023-05-28 NOTE — Telephone Encounter (Signed)
Patient had a my chart visit with Dr. Neva Seat today

## 2023-05-28 NOTE — Patient Instructions (Addendum)
Thank you for participating in the video visit today.  I am glad to hear that things are slowly improving. Keep a record of your blood pressures outside of the office and if remains over 130/90 we need to make changes- let me or cardiology know right away.  Continue aspirin once per day.  Try lovastatin every few days and if tolerated can take daily.  Stop crestor.  Talk to your therapist and psychiatrist about your psychiatric meds and concerns.  Albuterol if needed for wheezing or shortness of breath but I will try to get you seen by the pulmonologist soon for your pulmonary fibrosis lung condition.  Follow-up in 2 weeks and we can discuss blood pressure at that time.  As we discussed if there is any new focal weakness, slurred speech facial droop, new chest pains acute worsening breathing, or other new or acute worsening symptoms be seen in the emergency room.

## 2023-05-28 NOTE — Progress Notes (Unsigned)
.Virtual Visit via Video Note  After initial 9 minute chart review, I connected with [name] on 05/28/23 at 11:14 AM by a video enabled telemedicine application and verified that I am speaking with the correct person using two identifiers.  Patient location: home, with caregiver Amber and Rahema consent to discuss PHI. Video Call through 11:51.  My location: office - [place].    I discussed the limitations, risks, security and privacy concerns of performing an evaluation and management service by telephone and the availability of in person appointments. I also discussed with the patient that there may be a patient responsible charge related to this service. The patient expressed understanding and agreed to proceed, consent obtained  Chief complaint:  Chief Complaint  Patient presents with   Medication Management    BP -losartan 25 mg; amlodipine 5 mg; ropenerole 2 mg; aspirin 81-she is taking these. The statins are making her sick on her stomach and she is not taking them. 135/8? Around 9:30. Had to call the ambulance yesterday     History of Present Illness: [name] is a 77 y.o. female  Cerebrovascular accident Admitted June 22 through June 26.  I have not seen her for hospital follow-up.  See recent telephone messages, requested office visit to review medications.  She was noted to have an acute CVA with right-sided weakness, acute lacunar infarct of the left corona radiata.  Advanced chronic small vessel ischemia.  Neurology consulted recommended Plavix for 21 days then transition to aspirin alone.  Skilled nursing facility recommended.  Started on Lipitor 40 mg daily.  Noted to have hypertensive urgency with blood pressures up to 209/95.  Had not been taking the amlodipine and losartan regularly.  Recommended consistent medication use for blood pressure control.   Stable CKD 3B with creatinine around 1.0-1.3.  History of peripheral vascular disease, treated with  aspirin, Plavix, statin. Elevated TSH noted without diagnosis of hypothyroidism, TSH 4.7 with repeat testing recommended in 1 month. Small meningioma noted, not worrisome, left vertex, 13 mm. Tobacco cessation recommended and nicotine patch was offered, smoking a pack of cigarettes per day.  History of pulmonary fibrosis but will stable with O2 sats 95% on room air. Followed by behavioral health for anxiety and depression, was not taking Lamictal or Cymbalta but was continued on ropinirole and melatonin. Plans to call mental health provider - not taking lamictal and cymbalta as they make her sick to her stomach.  Taking melatonin for sleep. Not taking rozarem.  Cut back to 5-6 cigarettes per day.   August 14 neuro follow-up noted.  She was residing at [facility] skilled nursing facility, and she had hired a Programmer, systems for 2 to 3 days/week to assist with ADLs.  Right-sided weakness and gait difficulty was gradually improving and working with therapies, using rolling walker.  Completed 3 weeks of dual antiplatelet therapy and was taking aspirin alone at that time.  Difficulty tolerating statin, attributed it to causing nausea.  Recommended trying Crestor in place of Lipitor.  Denied new symptoms blood pressure 156/77 at that time, but reported blood pressures in the 120-130/70-80 range outpatient.   Hypertension: Up to 183/107 yesterday -caregiver called EMS, asymptomatic. Decreased to 158/79 at time of EMS eval. ER precautions were given if persistent elevation.  Taking losartan and amlodipine daily.  Am BP 138/82 this am.  Taking 81mg  ASA every day - no new bleeding - restarted aspirin few days ago (off for about a week prior). No new  bleeding. No new stroke symptoms.  Home for a week from [place], caregiver at home 24/7.  Miralax for constipation.  Has pepcid to use as needed.   BP Readings from Last 3 Encounters:  05/28/23 135/85  04/25/23 (!) 156/77  03/07/23 126/82   Lab Results  Component  Value Date   CREATININE 1.07 (H) 03/04/2023    Hyperlipidemia: As above.  Tried crestor - made feel sick to her stomach.  Lab Results  Component Value Date   CHOL 171 03/03/2023   HDL 54 03/03/2023   LDLCALC 96 03/03/2023   LDLDIRECT 135.6 08/15/2010   TRIG 103 03/03/2023   CHOLHDL 3.2 03/03/2023   Lab Results  Component Value Date   ALT 12 03/03/2023   AST 15 03/03/2023   ALKPHOS 71 03/03/2023   BILITOT 0.4 03/03/2023   Covid 19 infection: Diagnosed about 3 weeks ago, at [facility]. No antiviral. Hx of pulmonary fibrosis. Cough is better, still some cough. Persistent fatigue.  Denies current shortness of breath at rest - some with activity.  Last pulmonary visit 5/22022.  Improved dyspnea since having covid. Not using albuterol recently.    Patient Active Problem List   Diagnosis Date Noted   CVA (cerebral vascular accident) (HCC) 03/03/2023   Elevated troponin 03/03/2023   Hypertensive urgency 03/03/2023   CKD (chronic kidney disease), stage III (HCC) 03/03/2023   Psoriasis 03/03/2023   Centrilobular emphysema (HCC) 10/19/2020   Chronic respiratory failure with hypoxia (HCC) 12/26/2019   Shortness of breath 12/26/2019   Bilateral pulmonary infiltrates on CXR    Complicated UTI (urinary tract infection) 11/11/2019   AKI (acute kidney injury) (HCC) 11/11/2019   Hyponatremia 11/11/2019   Elevated LFTs 11/11/2019   Sepsis (HCC) 11/11/2019   Wound infection 11/07/2018   Postoperative pain    Sleep disturbance    Tobacco abuse    Acute blood loss anemia    Hypoalbuminemia due to protein-calorie malnutrition (HCC)    Debility 10/24/2018   Pre-operative cardiovascular examination 09/26/2018   HLD (hyperlipidemia) 09/26/2018   GAD (generalized anxiety disorder) 08/07/2018   Major depressive disorder, single episode 08/07/2018   Appendiceal abscess 07/08/2018   PVD (peripheral vascular disease) (HCC) 03/15/2018   Atherosclerosis of native artery of left lower  extremity with intermittent claudication (HCC) 03/15/2018   Chronic left-sided low back pain with left-sided sciatica 12/25/2017   Facial droop    History of CVA (cerebrovascular accident) 10/23/2017   Critical lower limb ischemia (HCC) 11/08/2016   Smoker 11/08/2016   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 01/16/2014   Mild cognitive impairment 01/16/2014   Meningioma (HCC) 10/21/2013   Chest pain 10/20/2013   Medicare annual wellness visit, subsequent 06/16/2013   Dizziness and giddiness 06/16/2013   RLS (restless legs syndrome) 04/29/2012   Abdominal pain 12/27/2010   DEGENERATIVE JOINT DISEASE 09/23/2010   CAROTID ARTERY DISEASE 08/15/2010   CHEST PAIN 08/15/2010   HEADACHE 12/02/2009   BACK PAIN 11/22/2009   CONSTIPATION, CHRONIC 01/29/2008   NAUSEA 01/28/2008   Depression 03/08/2007   HTN (hypertension) 03/08/2007   Past Medical History:  Diagnosis Date   AAA (abdominal aortic aneurysm) (HCC)    3.1 cm 07/08/18, 3 year follow-up recommended; possible 3 cm AAA by aortogram 09/13/18   Anemia    PMH   Appendicitis with abscess    07/08/18, s/p perc drain; resolved 07/30/18 by CT   Arthritis    Bipolar disorder (HCC)    Cerebrovascular  disease    intra and extracranial vascular dx per MRI 4/11, neurology rec strict CVRF control   Colonic inertia    Constipation    chronic;severe   Depression    Duodenitis    Gastritis    GERD (gastroesophageal reflux disease)    Hypertension    Hypothyroid 01/16/2014   Meningioma (HCC) 10/21/2013   PAD (peripheral artery disease) (HCC)    Psoriasis    sees derm   Wears dentures    Wears glasses    Past Surgical History:  Procedure Laterality Date   ABDOMINAL AORTOGRAM W/LOWER EXTREMITY N/A 09/13/2018   Procedure: ABDOMINAL AORTOGRAM W/LOWER EXTREMITY;  Surgeon: Sherren Kerns, MD;  Location: [facility];  Service: Cardiovascular;  Laterality: N/A;    arthroscopy  04/2010   Right knee   CATARACT EXTRACTION W/ INTRAOCULAR LENS  IMPLANT, BILATERAL     COLONOSCOPY     FEMORAL-POPLITEAL BYPASS GRAFT  10/22/2018   FEMORAL-POPLITEAL BYPASS GRAFT Left 10/22/2018   Procedure: LEFT FEMORAL TO BELOW THE KNEE POPLITEAL ARTERY BYPASS GRAFT;  Surgeon: Sherren Kerns, MD;  Location: [facility] OR;  Service: Vascular;  Laterality: Left;   I & D EXTREMITY Left 11/08/2018   Procedure: IRRIGATION AND DEBRIDEMENT EXTREMITY Left Leg;  Surgeon: Sherren Kerns, MD;  Location: [facility] OR;  Service: Vascular;  Laterality: Left;   IR RADIOLOGIST EVAL & MGMT  07/30/2018   MULTIPLE TOOTH EXTRACTIONS     TUBAL LIGATION     No Known Allergies Prior to Admission medications   Medication Sig Start Date End Date Taking? Authorizing Provider  amLODipine (NORVASC) 5 MG tablet Take 1 tablet (5 mg total) by mouth daily. 02/07/23  Yes Rollene Rotunda, MD  aspirin EC 81 MG tablet Take 81 mg by mouth daily. Swallow whole.   Yes [provider]  losartan (COZAAR) 25 MG tablet Take 1 tablet (25 mg total) by mouth daily. 02/23/23 02/23/24 Yes Hochrein, Fayrene Fearing, MD  rOPINIRole (REQUIP) 2 MG tablet TAKE 1 TABLET(2 MG) BY MOUTH AT BEDTIME 03/30/23  Yes Cottle, Steva Ready., MD  albuterol (VENTOLIN HFA) 108 (90 Base) MCG/ACT inhaler Inhale 1-2 puffs into the lungs every 6 (six) hours as needed for wheezing or shortness of breath. Patient not taking: Reported on 04/25/2023 06/09/22   Shade Flood, MD  clobetasol ointment (TEMOVATE) 0.05 % Apply topically 2 (two) times daily. Apply to affected areas with psoriatic rash Patient not taking: Reported on 05/28/2023 03/06/23   Cathren Harsh, MD  clopidogrel (PLAVIX) 75 MG tablet Take 75 mg by mouth daily. Patient not taking: Reported on 05/28/2023    [provider]  docusate sodium (COLACE) 100 MG capsule Take 1 capsule (100 mg total) by mouth 2 (two) times daily. Patient not taking: Reported on 05/28/2023 03/07/23   Rolly Salter, MD   famotidine (PEPCID) 20 MG tablet Take 20 mg by mouth daily as needed for indigestion. Patient not taking: Reported on 05/28/2023    [provider]  lamoTRIgine (LAMICTAL) 25 MG tablet Take 25 mg by mouth daily. Patient not taking: Reported on 05/28/2023    [provider]  Melatonin 10 MG TABS Take 10 mg by mouth at bedtime as needed (for sleep). Patient not taking: Reported on 05/28/2023    [provider]  nicotine (NICODERM CQ - DOSED IN MG/24 HOURS) 21 mg/24hr patch Place 1 patch (21 mg total) onto the skin daily. Patient not taking: Reported on 05/28/2023 03/07/23   Rai, Ripudeep  K, MD  polyethylene glycol (MIRALAX / GLYCOLAX) 17 g packet Take 34 g by mouth daily. Patient not taking: Reported on 05/28/2023 03/08/23   Rolly Salter, MD  ramelteon (ROZEREM) 8 MG tablet Take 8 mg by mouth at bedtime. Patient not taking: Reported on 05/28/2023    [provider]  rosuvastatin (CRESTOR) 20 MG tablet Take 1 tablet (20 mg total) by mouth daily. Patient not taking: Reported on 05/28/2023 04/25/23   Ihor Austin, NP  simethicone (MYLICON) 80 MG chewable tablet Chew 1 tablet (80 mg total) by mouth 4 (four) times daily. Patient not taking: Reported on 05/28/2023 03/07/23   Rolly Salter, MD   Social History   Socioeconomic History   Marital status: Widowed    Spouse name: Not on file   Number of children: 1   Years of education: Not on file   Highest education level: Not on file  Occupational History   Occupation: retired  Tobacco Use   Smoking status: Every Day    Current packs/day: 0.00    Average packs/day: 0.5 packs/day for 60.0 years (30.0 ttl pk-yrs)    Types: Cigarettes    Start date: 11/10/1959    Last attempt to quit: 11/10/2019    Years since quitting: 3.5   Smokeless tobacco: Never  Vaping Use   Vaping status: Former  Substance and Sexual Activity   Alcohol use: Yes    Alcohol/week: 0.0 standard drinks of alcohol    Comment: yes on occassion    Drug use: No   Sexual activity: Not Currently  Other Topics Concern   Not on file  Social History Narrative   Brother in law Mr [place] Neighbor   One son in [place].    Social Determinants of Health   Financial Resource Strain: Low Risk  (03/23/2021)   Overall Financial Resource Strain (CARDIA)    Difficulty of Paying Living Expenses: Not hard at all  Food Insecurity: No Food Insecurity (03/23/2021)   Hunger Vital Sign    Worried About Running Out of Food in the Last Year: Never true    Ran Out of Food in the Last Year: Never true  Transportation Needs: No Transportation Needs (03/23/2021)   PRAPARE - Administrator, Civil Service (Medical): No    Lack of Transportation (Non-Medical): No  Physical Activity: Inactive (03/23/2021)   Exercise Vital Sign    Days of Exercise per Week: 0 days    Minutes of Exercise per Session: 0 min  Stress: Not on file  Social Connections: Unknown (10/24/2018)   Social Connection and Isolation Panel [NHANES]    Frequency of Communication with Friends and Family: Patient declined    Frequency of Social Gatherings with Friends and Family: Patient declined    Attends Religious Services: Patient declined    Database administrator or Organizations: Patient declined    Attends Banker Meetings: Patient declined    Marital Status: Patient declined  Intimate Partner Violence: Unknown (10/24/2018)   Humiliation, Afraid, Rape, and Kick questionnaire    Fear of Current or Ex-Partner: Patient declined    Emotionally Abused: Patient declined    Physically Abused: Patient declined    Sexually Abused: Patient declined    Observations/Objective: Vitals:   05/28/23 1052  BP: 135/85    48 minutes spent during visit, including chart review, counseling and assimilation of information, exam, discussion of plan 30 statin options, home monitoring of blood pressure, ER precautions, and referral to pulmonary, and  chart completion.   Assessment  and Plan: History of CVA (cerebrovascular accident) - Plan: DISCONTINUED: lovastatin (MEVACOR) 20 MG tablet Multiple lacunar infarcts (HCC) - Plan: DISCONTINUED: lovastatin (MEVACOR) 20 MG tablet  -Reports improvement in symptoms, weakness improving, has assistance at home.  Secondary prevention discussed including with use of statin, intolerant to Crestor, can try low-dose lovastatin, even intermittent dosing may be helpful.  Close follow-up to review adherence and other options.  -Recommended discussing her psychiatric medications with her mental health provider regarding any concerns and tolerability and other options.  Primary hypertension  -Variable control as above.  Importance of blood pressure control with secondary prevention after CVA.  If elevated readings at home, advised to let me know or her cardiologist to adjust medications with ER precautions given.  Recheck in 2 weeks.  Idiopathic pulmonary fibrosis (HCC) - Plan: Ambulatory referral to Pulmonology, DISCONTINUED: albuterol (VENTOLIN HFA) 108 (90 Base) MCG/ACT inhaler  -Persistent symptoms, option of albuterol and refer to pulmonologist for follow-up and adjustment of meds if needed.  ER precautions if acute worsening.   Follow Up Instructions: 2 weeks   Patient Instructions  Thank you for participating in the video visit today.  I am glad to hear that things are slowly improving. Keep a record of your blood pressures outside of the office and if remains over 130/90 we need to make changes- let me or cardiology know right away.  Continue aspirin once per day.  Try lovastatin every few days and if tolerated can take daily.  Stop crestor.  Talk to your therapist and psychiatrist about your psychiatric meds and concerns.  Albuterol if needed for wheezing or shortness of breath but I will try to get you seen by the pulmonologist soon for your pulmonary fibrosis lung condition.  Follow-up in 2 weeks and we can discuss blood  pressure at that time.  As we discussed if there is any new focal weakness, slurred speech facial droop, new chest pains acute worsening breathing, or other new or acute worsening symptoms be seen in the emergency room.  I discussed the assessment and treatment plan with the patient. The patient was provided an opportunity to ask questions and all were answered. The patient agreed with the plan and demonstrated an understanding of the instructions.   The patient was advised to call back or seek an in-person evaluation if the symptoms worsen or if the condition fails to improve as anticipated.   Shade Flood, MD

## 2023-05-29 ENCOUNTER — Encounter (HOSPITAL_BASED_OUTPATIENT_CLINIC_OR_DEPARTMENT_OTHER): Payer: Self-pay

## 2023-05-30 DIAGNOSIS — F319 Bipolar disorder, unspecified: Secondary | ICD-10-CM | POA: Diagnosis not present

## 2023-05-30 DIAGNOSIS — D329 Benign neoplasm of meninges, unspecified: Secondary | ICD-10-CM | POA: Diagnosis not present

## 2023-05-30 DIAGNOSIS — I779 Disorder of arteries and arterioles, unspecified: Secondary | ICD-10-CM | POA: Diagnosis not present

## 2023-05-30 DIAGNOSIS — F39 Unspecified mood [affective] disorder: Secondary | ICD-10-CM | POA: Diagnosis not present

## 2023-05-30 DIAGNOSIS — I69351 Hemiplegia and hemiparesis following cerebral infarction affecting right dominant side: Secondary | ICD-10-CM | POA: Diagnosis not present

## 2023-05-30 DIAGNOSIS — I739 Peripheral vascular disease, unspecified: Secondary | ICD-10-CM | POA: Diagnosis not present

## 2023-05-31 ENCOUNTER — Telehealth: Payer: Self-pay | Admitting: Family Medicine

## 2023-05-31 ENCOUNTER — Other Ambulatory Visit: Payer: Self-pay

## 2023-05-31 ENCOUNTER — Encounter: Payer: Self-pay | Admitting: Family Medicine

## 2023-05-31 DIAGNOSIS — Z8673 Personal history of transient ischemic attack (TIA), and cerebral infarction without residual deficits: Secondary | ICD-10-CM

## 2023-05-31 DIAGNOSIS — I6381 Other cerebral infarction due to occlusion or stenosis of small artery: Secondary | ICD-10-CM

## 2023-05-31 DIAGNOSIS — J84112 Idiopathic pulmonary fibrosis: Secondary | ICD-10-CM

## 2023-05-31 MED ORDER — LOVASTATIN 20 MG PO TABS
20.0000 mg | ORAL_TABLET | Freq: Every day | ORAL | 3 refills | Status: DC
Start: 2023-05-31 — End: 2023-07-16

## 2023-05-31 MED ORDER — ALBUTEROL SULFATE HFA 108 (90 BASE) MCG/ACT IN AERS
1.0000 | INHALATION_SPRAY | Freq: Four times a day (QID) | RESPIRATORY_TRACT | 2 refills | Status: AC | PRN
Start: 2023-05-31 — End: ?

## 2023-05-31 NOTE — Telephone Encounter (Signed)
Patient Name First: Jenny Last: [name] Gender: Female DOB: 1945/11/14 Age: 77 Y 7 M 11 D Return Phone Number: [phone] (Primary) Address: City/ State/ Zip: [place]  [address] Client [facility] [place] Night - C Client Site [facility] - Night Provider [name]- MD Contact Type Call Who Is Calling Patient / Member / Family / Caregiver Call Type Triage / Clinical Caller Name Amber Tippett Relationship To Patient Other Return Phone Number [phone] (Primary) Chief Complaint Feet swelling Reason for Call Medication Question / Request Initial Comment Caller states she is calling on behalf of someone else. The patient spoke with Dr. Neva Seat via a televisit earlier this week. He was suppose to call in Lovastatin . The pharmacy has not received. Additional Comment Her right foot is current swollen. Translation No Disp. Time Lamount Cohen Time) Disposition Final User 05/30/2023 5:12:06 PM Attempt made - message left Shurden, RN, Zollie Scale 05/30/2023 5:30:18 PM Attempt made - message left Stephanie Acre, RN, Zollie Scale 05/30/2023 5:58:49 PM FINAL ATTEMPT MADE - message left Yes Stephanie Acre, RN, Zollie Scale Final Disposition 05/30/2023 5:58:49 PM FINAL ATTEMPT MADE - message left Yes Shurden, RN, Zollie Scale

## 2023-05-31 NOTE — Telephone Encounter (Signed)
lovastatin (MEVACOR) 20 MG tablet  has been filled and pt is aware.

## 2023-05-31 NOTE — Telephone Encounter (Signed)
This Rx has been sent to the inccorect pharmacy it was sent to [facility] and was meant to go to [facility], which is actually on [address] in [place]. Pt has been informed and we decided to change it to the [facility] as requested

## 2023-06-04 DIAGNOSIS — F319 Bipolar disorder, unspecified: Secondary | ICD-10-CM | POA: Diagnosis not present

## 2023-06-04 DIAGNOSIS — D329 Benign neoplasm of meninges, unspecified: Secondary | ICD-10-CM | POA: Diagnosis not present

## 2023-06-04 DIAGNOSIS — I69351 Hemiplegia and hemiparesis following cerebral infarction affecting right dominant side: Secondary | ICD-10-CM | POA: Diagnosis not present

## 2023-06-04 DIAGNOSIS — I739 Peripheral vascular disease, unspecified: Secondary | ICD-10-CM | POA: Diagnosis not present

## 2023-06-04 DIAGNOSIS — F39 Unspecified mood [affective] disorder: Secondary | ICD-10-CM | POA: Diagnosis not present

## 2023-06-04 DIAGNOSIS — I779 Disorder of arteries and arterioles, unspecified: Secondary | ICD-10-CM | POA: Diagnosis not present

## 2023-06-06 DIAGNOSIS — I779 Disorder of arteries and arterioles, unspecified: Secondary | ICD-10-CM | POA: Diagnosis not present

## 2023-06-06 DIAGNOSIS — F319 Bipolar disorder, unspecified: Secondary | ICD-10-CM | POA: Diagnosis not present

## 2023-06-06 DIAGNOSIS — I69351 Hemiplegia and hemiparesis following cerebral infarction affecting right dominant side: Secondary | ICD-10-CM | POA: Diagnosis not present

## 2023-06-06 DIAGNOSIS — F39 Unspecified mood [affective] disorder: Secondary | ICD-10-CM | POA: Diagnosis not present

## 2023-06-06 DIAGNOSIS — D329 Benign neoplasm of meninges, unspecified: Secondary | ICD-10-CM | POA: Diagnosis not present

## 2023-06-06 DIAGNOSIS — I739 Peripheral vascular disease, unspecified: Secondary | ICD-10-CM | POA: Diagnosis not present

## 2023-06-07 ENCOUNTER — Telehealth: Payer: Self-pay | Admitting: Gastroenterology

## 2023-06-07 DIAGNOSIS — I779 Disorder of arteries and arterioles, unspecified: Secondary | ICD-10-CM | POA: Diagnosis not present

## 2023-06-07 DIAGNOSIS — F319 Bipolar disorder, unspecified: Secondary | ICD-10-CM | POA: Diagnosis not present

## 2023-06-07 DIAGNOSIS — F39 Unspecified mood [affective] disorder: Secondary | ICD-10-CM | POA: Diagnosis not present

## 2023-06-07 DIAGNOSIS — D329 Benign neoplasm of meninges, unspecified: Secondary | ICD-10-CM | POA: Diagnosis not present

## 2023-06-07 DIAGNOSIS — I739 Peripheral vascular disease, unspecified: Secondary | ICD-10-CM | POA: Diagnosis not present

## 2023-06-07 DIAGNOSIS — I69351 Hemiplegia and hemiparesis following cerebral infarction affecting right dominant side: Secondary | ICD-10-CM | POA: Diagnosis not present

## 2023-06-07 NOTE — Telephone Encounter (Signed)
Inbound call from patient requesting a call to discuss her bowel habits. Patient wishing to speak further when she is given a call back. Please advise, thank you.

## 2023-06-08 ENCOUNTER — Telehealth: Payer: Self-pay | Admitting: Family Medicine

## 2023-06-08 ENCOUNTER — Encounter: Payer: Self-pay | Admitting: Licensed Clinical Social Worker

## 2023-06-08 ENCOUNTER — Other Ambulatory Visit: Payer: Self-pay

## 2023-06-08 ENCOUNTER — Telehealth: Payer: Self-pay | Admitting: *Deleted

## 2023-06-08 DIAGNOSIS — Z8673 Personal history of transient ischemic attack (TIA), and cerebral infarction without residual deficits: Secondary | ICD-10-CM

## 2023-06-08 DIAGNOSIS — I6381 Other cerebral infarction due to occlusion or stenosis of small artery: Secondary | ICD-10-CM

## 2023-06-08 DIAGNOSIS — I10 Essential (primary) hypertension: Secondary | ICD-10-CM

## 2023-06-08 DIAGNOSIS — J84112 Idiopathic pulmonary fibrosis: Secondary | ICD-10-CM

## 2023-06-08 NOTE — Telephone Encounter (Signed)
I am not sure the best option in this situation.  Did she have a Child psychotherapist that is working with her that can help coordinate this care?  If not can we place a stat referral for social work to try to intervene today?

## 2023-06-08 NOTE — Telephone Encounter (Signed)
Okay, urgent referral placed for social worker, but let me know if that needs to be ordered differently.  Will forward to stat referral pool.

## 2023-06-08 NOTE — Telephone Encounter (Signed)
Spoke with patient who explains that she had CVA in early September of this year (although chart indicates she had CVA 02/2023) and has had difficulty with having bowel movements since. Patient says that she just started back taking Linzess and was also taking Miralax at times but this did not help. States she used magnesium citrate yesterday and finally had a successful bowel movement. States she isnt sure what to do.  I advised that she restart her Linzess as previously directed and take this consistently. Patient has been advised to eat foods high in fiber, increase water intake to at least 64 ounces per day and increase exercise/physical activity (as much as able due to residual weakness following stroke) to aid in movement of stool through the colon. I have also recommended the addition of a fiber supplement (metamucil or benefiber) daily and Miralax (OTC) 17 grams dissolved in at least 8 ounces water/juice once daily once re-established on Linzess with titration as needed based on bowel movements. Patient is advised that if she continues to have difficulty with bowel movements despite these recommendations, we will need to discuss in office.   Patient was actually already scheduled for a follow up with Dr Adela Lank on 06/12/23 for "RUQ abdominal pain, gallstones" but states she had no idea of this appointment. She states it will be nearly impossible for her to get to the appointment based on her recent health history. Instead, she has asked that this appointment be cancelled and she has been rescheduled to Dr Venida Jarvis' next available date, 10/05/23 at 320 pm. Patient verbalizes understanding.

## 2023-06-08 NOTE — Telephone Encounter (Signed)
Pt called asking for help on finding another rehab facility to move to. She is changing from the one she is currently at and she didn't realize she had 21 days to move and her 21 days is up on Sunday she believes. She asked if there is any way possible you could help her with rehab options on where she should go. She asked if you could give her a call to discuss her options with her [phone].

## 2023-06-08 NOTE — Telephone Encounter (Signed)
Patient does not have a Child psychotherapist but would really like to have one again if we can order it for her

## 2023-06-08 NOTE — Telephone Encounter (Signed)
Called patient. She is currently at home. Has 2 caretakers, but not enough. Feels like more rehab needed from professionals - has rehab once per week at home. Home assistance but troubling walking. Having to use walker, wheelchair. No falls, but feels too weak to use walker - afraid she will fall. She was advised she has 21 days to go to different rehab facility which ends in few days - she thinks that she needs to go to SNF - trouble walking and getting to restroom. Now down to 1 caretaker at home. May have someone else to come help but not sure.   Urgent SW referral placed earlier - looks like they are trying to reach her.  Will relay this information, but advised patient that I am not sure of the next steps and whether or not she would need to be seen through the hospital first or she could be directly evaluated by one of the skilled nursing facilities.

## 2023-06-08 NOTE — Progress Notes (Unsigned)
Care Coordination  Outreach Note  06/08/2023 Name: [name] MRN: [id number] DOB: 1946/07/23   Care Coordination Outreach Attempts: An unsuccessful telephone outreach was attempted today to offer the patient information about available care coordination services.  Follow Up Plan:  Additional outreach attempts will be made to offer the patient care coordination information and services.   Encounter Outcome:  No Answer  Burman Nieves, CCMA Care Coordination Care Guide Direct Dial: [phone]

## 2023-06-11 ENCOUNTER — Telehealth: Payer: Self-pay | Admitting: Family Medicine

## 2023-06-11 DIAGNOSIS — I69351 Hemiplegia and hemiparesis following cerebral infarction affecting right dominant side: Secondary | ICD-10-CM | POA: Diagnosis not present

## 2023-06-11 DIAGNOSIS — I779 Disorder of arteries and arterioles, unspecified: Secondary | ICD-10-CM | POA: Diagnosis not present

## 2023-06-11 DIAGNOSIS — F39 Unspecified mood [affective] disorder: Secondary | ICD-10-CM | POA: Diagnosis not present

## 2023-06-11 DIAGNOSIS — F319 Bipolar disorder, unspecified: Secondary | ICD-10-CM | POA: Diagnosis not present

## 2023-06-11 DIAGNOSIS — I739 Peripheral vascular disease, unspecified: Secondary | ICD-10-CM | POA: Diagnosis not present

## 2023-06-11 DIAGNOSIS — R230 Cyanosis: Secondary | ICD-10-CM

## 2023-06-11 DIAGNOSIS — D329 Benign neoplasm of meninges, unspecified: Secondary | ICD-10-CM | POA: Diagnosis not present

## 2023-06-11 NOTE — Patient Outreach (Signed)
Care Coordination   Collaboration  Visit Note   06/08/2023 Name: [name] MRN: [id number] DOB: October 24, 1945  [name] is a 77 y.o. year old female who sees Jenny Flood, MD for primary care. I  engaged with [name]  What matters to the patients health and wellness today?  Scheduling patient for complex care coordination    SDOH assessments and interventions completed:  No     Care Coordination Interventions:  Yes, provided  Interventions Today    Flowsheet Row Most Recent Value  Chronic Disease   Chronic disease during today's visit Hypertension (HTN), Chronic Kidney Disease/End Stage Renal Disease (ESRD)  General Interventions   General Interventions Discussed/Reviewed Communication with  Communication with --  [LCSW collaborated with [name] regarding referral to complex care management, patient care needs, and barriers to scheduling initial appt]       Follow up plan:  Care guide will make continued attempts to contact pt to schedule initial with LCSW    Encounter Outcome:  Patient Visit Completed   [name], MSW, LCSW [facility] Care Management [facility]  [facility] [name] Phone [phone] 6:33 PM

## 2023-06-11 NOTE — Telephone Encounter (Signed)
Has an appointment with social work on 06/13/23

## 2023-06-11 NOTE — Telephone Encounter (Signed)
Pt needs a note sent to [facility] stating okay for her to transition there  Pt does not want to wait for social worker appointment notes it is faster if you do a "direct consent"   Please advise

## 2023-06-11 NOTE — Telephone Encounter (Signed)
Pt called asking for a return call as soon as possible regarding rehab [phone]

## 2023-06-11 NOTE — Progress Notes (Signed)
Care Coordination  Outreach Note  06/11/2023 Name: ABRIYA LATTANZI MRN: [id number] DOB: [birth year]   Care Coordination Outreach Attempts: A second unsuccessful outreach was attempted today to offer the patient with information about available care coordination services.  Follow Up Plan:  Additional outreach attempts will be made to offer the patient care coordination information and services.   Encounter Outcome:  No Answer  Burman Nieves, CCMA Care Coordination Care Guide Direct Dial: [phone]

## 2023-06-11 NOTE — Progress Notes (Signed)
Care Coordination   Note   06/11/2023 Name: [name] MRN: [id number] DOB: 1946/08/29  [name] is a 77 y.o. year old female who sees Shade Flood, MD for primary care. I reached out to [name] by phone today to offer care coordination services.  [name] was given information about Care Coordination services today including:   The Care Coordination services include support from the care team which includes your Nurse Coordinator, Clinical Social Worker, or Pharmacist.  The Care Coordination team is here to help remove barriers to the health concerns and goals most important to you. Care Coordination services are voluntary, and the patient may decline or stop services at any time by request to their care team member.   Care Coordination Consent Status: Patient agreed to services and verbal consent obtained.   Follow up plan:  Telephone appointment with care coordination team member scheduled for:  06/13/2023  Encounter Outcome:  Patient Scheduled from referral   Burman Nieves, [facility] Care Coordination Care Guide Direct Dial: [phone]

## 2023-06-11 NOTE — Telephone Encounter (Signed)
Caller name: MABRY SANTARELLI  On DPR?: Yes  Call back number: [phone] (mobile)  Provider they see: Shade Flood, MD  Reason for call:   She wants to go [facility]) and they're holding a spot for here and would to talk with you ASAP

## 2023-06-12 ENCOUNTER — Telehealth: Payer: Self-pay | Admitting: Family Medicine

## 2023-06-12 ENCOUNTER — Ambulatory Visit: Payer: Medicare Other | Admitting: Gastroenterology

## 2023-06-12 DIAGNOSIS — I69351 Hemiplegia and hemiparesis following cerebral infarction affecting right dominant side: Secondary | ICD-10-CM | POA: Diagnosis not present

## 2023-06-12 DIAGNOSIS — I739 Peripheral vascular disease, unspecified: Secondary | ICD-10-CM | POA: Diagnosis not present

## 2023-06-12 DIAGNOSIS — Z0279 Encounter for issue of other medical certificate: Secondary | ICD-10-CM

## 2023-06-12 DIAGNOSIS — F39 Unspecified mood [affective] disorder: Secondary | ICD-10-CM | POA: Diagnosis not present

## 2023-06-12 DIAGNOSIS — D329 Benign neoplasm of meninges, unspecified: Secondary | ICD-10-CM | POA: Diagnosis not present

## 2023-06-12 DIAGNOSIS — F319 Bipolar disorder, unspecified: Secondary | ICD-10-CM | POA: Diagnosis not present

## 2023-06-12 DIAGNOSIS — I779 Disorder of arteries and arterioles, unspecified: Secondary | ICD-10-CM | POA: Diagnosis not present

## 2023-06-12 NOTE — Telephone Encounter (Signed)
Okay, noted.  Please advise patient and keep appointment with social worker tomorrow.  I am back in the office tomorrow morning if my assistance is needed that time.  Thanks for your help.

## 2023-06-12 NOTE — Telephone Encounter (Signed)
Received forms from [facility] Printed & placed in provider bin

## 2023-06-12 NOTE — Telephone Encounter (Signed)
LM with [facility] about request for information on how to transition her care to their facility, will wait on a call back

## 2023-06-12 NOTE — Telephone Encounter (Signed)
Placed in your sign folder  

## 2023-06-12 NOTE — Telephone Encounter (Signed)
[name] returned [name] call and does not have a bed for her there.

## 2023-06-12 NOTE — Telephone Encounter (Signed)
[facility] does not have a spot open pt will need to speak to social worker

## 2023-06-12 NOTE — Telephone Encounter (Signed)
Attempted a call to patient but no answer and there was no VM available

## 2023-06-12 NOTE — Telephone Encounter (Signed)
See other phone call: Pt needs a note sent to [facility] stating okay for her to transition there  Pt does not want to wait for social worker appointment notes it is faster if you do a "direct consent"    Please advise    I am fine with [place] sending a note or orders to [facility] for her to be admitted but I am not sure specifically what they need.  Can we check to see what is needed from our office?  This may be where social work can help but I know they are not able to meet with her until tomorrow.  Let me know what I can do in the meantime. May want to start with call to patient as she did request call yesterday.  Let me know if I need to additionally call her.  Thanks.

## 2023-06-12 NOTE — Telephone Encounter (Signed)
See other telephone note regarding same issue.

## 2023-06-12 NOTE — Telephone Encounter (Signed)
I called and spoke with patient yesterday and that is how I got info about [name] being her location of choice as well as the request for "direct consent" I will call [name] today and ask what is needed in order to transfer her

## 2023-06-13 ENCOUNTER — Ambulatory Visit: Payer: Medicare Other

## 2023-06-13 ENCOUNTER — Ambulatory Visit: Payer: Self-pay | Admitting: Licensed Clinical Social Worker

## 2023-06-13 ENCOUNTER — Telehealth: Payer: Self-pay | Admitting: Family Medicine

## 2023-06-13 DIAGNOSIS — I69351 Hemiplegia and hemiparesis following cerebral infarction affecting right dominant side: Secondary | ICD-10-CM | POA: Diagnosis not present

## 2023-06-13 DIAGNOSIS — I739 Peripheral vascular disease, unspecified: Secondary | ICD-10-CM | POA: Diagnosis not present

## 2023-06-13 DIAGNOSIS — Z0000 Encounter for general adult medical examination without abnormal findings: Secondary | ICD-10-CM | POA: Diagnosis not present

## 2023-06-13 DIAGNOSIS — F319 Bipolar disorder, unspecified: Secondary | ICD-10-CM | POA: Diagnosis not present

## 2023-06-13 DIAGNOSIS — F39 Unspecified mood [affective] disorder: Secondary | ICD-10-CM | POA: Diagnosis not present

## 2023-06-13 DIAGNOSIS — D329 Benign neoplasm of meninges, unspecified: Secondary | ICD-10-CM | POA: Diagnosis not present

## 2023-06-13 DIAGNOSIS — I779 Disorder of arteries and arterioles, unspecified: Secondary | ICD-10-CM | POA: Diagnosis not present

## 2023-06-13 NOTE — Telephone Encounter (Signed)
Per other message there is no opening at [facility] and patient needs to keep appt with social work today, attempted a call to her yesterday as well as this morning and there was no answer

## 2023-06-13 NOTE — Patient Instructions (Signed)
[name] , Thank you for taking time to come for your Medicare Wellness Visit. I appreciate your ongoing commitment to your health goals. Please review the following plan we discussed and let me know if I can assist you in the future.   Screening recommendations/referrals: Colonoscopy: Education provided Mammogram: Education provided Bone Density: Education provided Recommended yearly ophthalmology/optometry visit for glaucoma screening and checkup Recommended yearly dental visit for hygiene and checkup  Vaccinations: Influenza vaccine: Education provided Pneumococcal vaccine: Education provided Tdap vaccine: Education provided Shingles vaccine: Education provided      Preventive Care 65 Years and Older, Female Preventive care refers to lifestyle choices and visits with your health care provider that can promote health and wellness. What does preventive care include? A yearly physical exam. This is also called an annual well check. Dental exams once or twice a year. Routine eye exams. Ask your health care provider how often you should have your eyes checked. Personal lifestyle choices, including: Daily care of your teeth and gums. Regular physical activity. Eating a healthy diet. Avoiding tobacco and drug use. Limiting alcohol use. Practicing safe sex. Taking low-dose aspirin every day. Taking vitamin and mineral supplements as recommended by your health care provider. What happens during an annual well check? The services and screenings done by your health care provider during your annual well check will depend on your age, overall health, lifestyle risk factors, and family history of disease. Counseling  Your health care provider may ask you questions about your: Alcohol use. Tobacco use. Drug use. Emotional well-being. Home and relationship well-being. Sexual activity. Eating habits. History of falls. Memory and ability to understand (cognition). Work and work  Astronomer. Reproductive health. Screening  You may have the following tests or measurements: Height, weight, and BMI. Blood pressure. Lipid and cholesterol levels. These may be checked every 5 years, or more frequently if you are over 73 years old. Skin check. Lung cancer screening. You may have this screening every year starting at age 50 if you have a 30-pack-year history of smoking and currently smoke or have quit within the past 15 years. Fecal occult blood test (FOBT) of the stool. You may have this test every year starting at age 69. Flexible sigmoidoscopy or colonoscopy. You may have a sigmoidoscopy every 5 years or a colonoscopy every 10 years starting at age 80. Hepatitis C blood test. Hepatitis B blood test. Sexually transmitted disease (STD) testing. Diabetes screening. This is done by checking your blood sugar (glucose) after you have not eaten for a while (fasting). You may have this done every 1-3 years. Bone density scan. This is done to screen for osteoporosis. You may have this done starting at age 93. Mammogram. This may be done every 1-2 years. Talk to your health care provider about how often you should have regular mammograms. Talk with your health care provider about your test results, treatment options, and if necessary, the need for more tests. Vaccines  Your health care provider may recommend certain vaccines, such as: Influenza vaccine. This is recommended every year. Tetanus, diphtheria, and acellular pertussis (Tdap, Td) vaccine. You may need a Td booster every 10 years. Zoster vaccine. You may need this after age 89. Pneumococcal 13-valent conjugate (PCV13) vaccine. One dose is recommended after age 41. Pneumococcal polysaccharide (PPSV23) vaccine. One dose is recommended after age 39. Talk to your health care provider about which screenings and vaccines you need and how often you need them. This information is not intended to replace advice  given to you by  your health care provider. Make sure you discuss any questions you have with your health care provider. Document Released: 09/24/2015 Document Revised: 05/17/2016 Document Reviewed: 06/29/2015 Elsevier Interactive Patient Education  2017 [employer].  Fall Prevention in the Home Falls can cause injuries. They can happen to people of all ages. There are many things you can do to make your home safe and to help prevent falls. What can I do on the outside of my home? Regularly fix the edges of walkways and driveways and fix any cracks. Remove anything that might make you trip as you walk through a door, such as a raised step or threshold. Trim any bushes or trees on the path to your home. Use bright outdoor lighting. Clear any walking paths of anything that might make someone trip, such as rocks or tools. Regularly check to see if handrails are loose or broken. Make sure that both sides of any steps have handrails. Any raised decks and porches should have guardrails on the edges. Have any leaves, snow, or ice cleared regularly. Use sand or salt on walking paths during winter. Clean up any spills in your garage right away. This includes oil or grease spills. What can I do in the bathroom? Use night lights. Install grab bars by the toilet and in the tub and shower. Do not use towel bars as grab bars. Use non-skid mats or decals in the tub or shower. If you need to sit down in the shower, use a plastic, non-slip stool. Keep the floor dry. Clean up any water that spills on the floor as soon as it happens. Remove soap buildup in the tub or shower regularly. Attach bath mats securely with double-sided non-slip rug tape. Do not have throw rugs and other things on the floor that can make you trip. What can I do in the bedroom? Use night lights. Make sure that you have a light by your bed that is easy to reach. Do not use any sheets or blankets that are too big for your bed. They should not hang  down onto the floor. Have a firm chair that has side arms. You can use this for support while you get dressed. Do not have throw rugs and other things on the floor that can make you trip. What can I do in the kitchen? Clean up any spills right away. Avoid walking on wet floors. Keep items that you use a lot in easy-to-reach places. If you need to reach something above you, use a strong step stool that has a grab bar. Keep electrical cords out of the way. Do not use floor polish or wax that makes floors slippery. If you must use wax, use non-skid floor wax. Do not have throw rugs and other things on the floor that can make you trip. What can I do with my stairs? Do not leave any items on the stairs. Make sure that there are handrails on both sides of the stairs and use them. Fix handrails that are broken or loose. Make sure that handrails are as long as the stairways. Check any carpeting to make sure that it is firmly attached to the stairs. Fix any carpet that is loose or worn. Avoid having throw rugs at the top or bottom of the stairs. If you do have throw rugs, attach them to the floor with carpet tape. Make sure that you have a light switch at the top of the stairs and the bottom of  the stairs. If you do not have them, ask someone to add them for you. What else can I do to help prevent falls? Wear shoes that: Do not have high heels. Have rubber bottoms. Are comfortable and fit you well. Are closed at the toe. Do not wear sandals. If you use a stepladder: Make sure that it is fully opened. Do not climb a closed stepladder. Make sure that both sides of the stepladder are locked into place. Ask someone to hold it for you, if possible. Clearly mark and make sure that you can see: Any grab bars or handrails. First and last steps. Where the edge of each step is. Use tools that help you move around (mobility aids) if they are needed. These  include: Canes. Walkers. Scooters. Crutches. Turn on the lights when you go into a dark area. Replace any light bulbs as soon as they burn out. Set up your furniture so you have a clear path. Avoid moving your furniture around. If any of your floors are uneven, fix them. If there are any pets around you, be aware of where they are. Review your medicines with your doctor. Some medicines can make you feel dizzy. This can increase your chance of falling. Ask your doctor what other things that you can do to help prevent falls. This information is not intended to replace advice given to you by your health care provider. Make sure you discuss any questions you have with your health care provider. Document Released: 06/24/2009 Document Revised: 02/03/2016 Document Reviewed: 10/02/2014 Elsevier Interactive Patient Education  2017 [employer].

## 2023-06-13 NOTE — Progress Notes (Signed)
Subjective:   [name] is a 77 y.o. female who presents for Medicare Annual (Subsequent) preventive examination.  Visit Complete: Virtual  I connected with  ARANDA BIHM on 06/13/23 by a audio enabled telemedicine application and verified that I am speaking with the correct person using two identifiers.  Patient Location: Home  Provider Location: Home Office  I discussed the limitations of evaluation and management by telemedicine. The patient expressed understanding and agreed to proceed.  Because this visit was a virtual/telehealth visit, some criteria may be missing or patient reported. Any vitals not documented were not able to be obtained and vitals that have been documented are patient reported.   Cardiac Risk Factors include: advanced age (>46men, >69 women);family history of premature cardiovascular disease;hypertension     Objective:    Today's Vitals   06/13/23 0934  PainSc: 7    There is no height or weight on file to calculate BMI.     06/13/2023    9:43 AM 03/03/2023    3:30 AM 02/13/2023    2:57 PM 11/27/2022   11:16 AM 06/01/2022    8:08 PM 06/27/2021   12:00 AM 06/10/2021    4:18 PM  Advanced Directives  Does Patient Have a Medical Advance Directive? Yes No No No Yes  Yes  Type of Psychiatrist of [employer] Power of [place];Living will Healthcare Power of [place];Living will  Does patient want to make changes to medical advance directive?       No - Patient declined  Copy of Healthcare Power of Attorney in Chart? No - copy requested      No - copy requested  Would patient like information on creating a medical advance directive?      No - Patient declined     Current Medications (verified) Outpatient Encounter Medications as of 06/13/2023  Medication Sig   albuterol (VENTOLIN HFA) 108 (90 Base) MCG/ACT inhaler Inhale 1-2 puffs into the lungs every 6 (six) hours as needed for wheezing or  shortness of breath.   amLODipine (NORVASC) 5 MG tablet Take 1 tablet (5 mg total) by mouth daily.   aspirin EC 81 MG tablet Take 81 mg by mouth daily. Swallow whole.   losartan (COZAAR) 25 MG tablet Take 1 tablet (25 mg total) by mouth daily.   lovastatin (MEVACOR) 20 MG tablet Take 1 tablet (20 mg total) by mouth at bedtime.   rOPINIRole (REQUIP) 2 MG tablet TAKE 1 TABLET(2 MG) BY MOUTH AT BEDTIME   clobetasol ointment (TEMOVATE) 0.05 % Apply topically 2 (two) times daily. Apply to affected areas with psoriatic rash (Patient not taking: Reported on 05/28/2023)   famotidine (PEPCID) 20 MG tablet Take 20 mg by mouth daily as needed for indigestion. (Patient not taking: Reported on 05/28/2023)   lamoTRIgine (LAMICTAL) 25 MG tablet Take 25 mg by mouth daily. (Patient not taking: Reported on 05/28/2023)   Melatonin 10 MG TABS Take 10 mg by mouth at bedtime as needed (for sleep). (Patient not taking: Reported on 05/28/2023)   polyethylene glycol (MIRALAX / GLYCOLAX) 17 g packet Take 34 g by mouth daily. (Patient not taking: Reported on 05/28/2023)   simethicone (MYLICON) 80 MG chewable tablet Chew 1 tablet (80 mg total) by mouth 4 (four) times daily. (Patient not taking: Reported on 05/28/2023)   No facility-administered encounter medications on file as of 06/13/2023.    Allergies (verified) Patient has no known allergies.  History: Past Medical History:  Diagnosis Date   AAA (abdominal aortic aneurysm) (HCC)    3.1 cm 07/08/18, 3 year follow-up recommended; possible 3 cm AAA by aortogram 09/13/18   Anemia    PMH   Appendicitis with abscess    07/08/18, s/p perc drain; resolved 07/30/18 by CT   Arthritis    Bipolar disorder (HCC)    Cerebrovascular disease    intra and extracranial vascular dx per MRI 4/11, neurology rec strict CVRF control   Colonic inertia    Constipation    chronic;severe   Depression    Duodenitis    Gastritis    GERD (gastroesophageal reflux disease)    Hypertension     Hypothyroid 01/16/2014   Meningioma (HCC) 10/21/2013   PAD (peripheral artery disease) (HCC)    Psoriasis    sees derm   Wears dentures    Wears glasses    Past Surgical History:  Procedure Laterality Date   ABDOMINAL AORTOGRAM W/LOWER EXTREMITY N/A 09/13/2018   Procedure: ABDOMINAL AORTOGRAM W/LOWER EXTREMITY;  Surgeon: Sherren Kerns, MD;  Location: [facility];  Service: Cardiovascular;  Laterality: N/A;   arthroscopy  04/2010   Right knee   CATARACT EXTRACTION W/ INTRAOCULAR LENS  IMPLANT, BILATERAL     COLONOSCOPY     FEMORAL-POPLITEAL BYPASS GRAFT  10/22/2018   FEMORAL-POPLITEAL BYPASS GRAFT Left 10/22/2018   Procedure: LEFT FEMORAL TO BELOW THE KNEE POPLITEAL ARTERY BYPASS GRAFT;  Surgeon: Sherren Kerns, MD;  Location: [facility] OR;  Service: Vascular;  Laterality: Left;   I & D EXTREMITY Left 11/08/2018   Procedure: IRRIGATION AND DEBRIDEMENT EXTREMITY Left Leg;  Surgeon: Sherren Kerns, MD;  Location: [facility] OR;  Service: Vascular;  Laterality: Left;   IR RADIOLOGIST EVAL & MGMT  07/30/2018   MULTIPLE TOOTH EXTRACTIONS     TUBAL LIGATION     Family History  Problem Relation Age of Onset   Breast cancer Mother        metastisis to bones   Cancer Sister 30       spinal   Alcohol abuse Brother    Heart disease Brother    Lung cancer Brother        smoked   Diabetes Son    Heart disease Maternal Aunt    Heart disease Other        grandfather    Colon cancer Neg Hx    Esophageal cancer Neg Hx    Stomach cancer Neg Hx    Social History   Socioeconomic History   Marital status: Widowed    Spouse name: Not on file   Number of children: 1   Years of education: Not on file   Highest education level: Not on file  Occupational History   Occupation: retired  Tobacco Use   Smoking status: Every Day    Current packs/day: 0.00    Average packs/day: 0.5 packs/day for 60.0 years (30.0 ttl pk-yrs)    Types: Cigarettes    Start date: 11/10/1959    Last attempt to  quit: 11/10/2019    Years since quitting: 3.5   Smokeless tobacco: Never  Vaping Use   Vaping status: Former  Substance and Sexual Activity   Alcohol use: Yes    Alcohol/week: 0.0 standard drinks of alcohol    Comment: yes on occassion   Drug use: No   Sexual activity: Not Currently  Other Topics Concern   Not on file  Social History Narrative  Brother in law Mr [place] Neighbor   One son in [place].    Social Determinants of Health   Financial Resource Strain: Low Risk  (03/23/2021)   Overall Financial Resource Strain (CARDIA)    Difficulty of Paying Living Expenses: Not hard at all  Food Insecurity: No Food Insecurity (06/13/2023)   Hunger Vital Sign    Worried About Running Out of Food in the Last Year: Never true    Ran Out of Food in the Last Year: Never true  Transportation Needs: No Transportation Needs (06/13/2023)   PRAPARE - Administrator, Civil Service (Medical): No    Lack of Transportation (Non-Medical): No  Physical Activity: Inactive (06/13/2023)   Exercise Vital Sign    Days of Exercise per Week: 0 days    Minutes of Exercise per Session: 0 min  Stress: Stress Concern Present (06/13/2023)   [employer] of Occupational Health - Occupational Stress Questionnaire    Feeling of Stress : Very much  Social Connections: Socially Isolated (06/13/2023)   Social Connection and Isolation Panel [NHANES]    Frequency of Communication with Friends and Family: Three times a week    Frequency of Social Gatherings with Friends and Family: Twice a week    Attends Religious Services: Never    Database administrator or Organizations: No    Attends Banker Meetings: Never    Marital Status: Widowed    Tobacco Counseling Ready to quit: Not Answered Counseling given: Not Answered   Clinical Intake:  Pre-visit preparation completed: Yes  Pain : 0-10 Pain Score: 7  Pain Type: Chronic pain Pain Location: Back Pain Descriptors / Indicators:  Burning, Aching, Dull, Constant Pain Onset: 1 to 4 weeks ago Pain Frequency: Intermittent     Diabetes: No  How often do you need to have someone help you when you read instructions, pamphlets, or other written materials from your doctor or pharmacy?: 1 - Never  Interpreter Needed?: No  Information entered by :: Remi Haggard LPN   Activities of Daily Living    06/13/2023    9:44 AM 03/03/2023    2:06 PM  In your present state of health, do you have any difficulty performing the following activities:  Hearing? 0 0  Vision? 0 0  Difficulty concentrating or making decisions? 1 1  Walking or climbing stairs? 1 1  Dressing or bathing? 1 1  Doing errands, shopping? 1   Preparing Food and eating ? Y   Using the Toilet? Y   In the past six months, have you accidently leaked urine? Y   Do you have problems with loss of bowel control? N   Managing your Medications? N   Managing your Finances? N   Housekeeping or managing your Housekeeping? Y     Patient Care Team: Shade Flood, MD as PCP - General (Family Medicine)  Indicate any recent Medical Services you may have received from other than [facility] providers in the past year (date may be approximate).     Assessment:   This is a routine wellness examination for Annie.  Hearing/Vision screen Hearing Screening - Comments:: No trouble hearing Vision Screening - Comments:: Not up to date   Goals Addressed             This Visit's Progress    Patient Stated       Like to be in a retirement facility          Depression Screen  06/13/2023    9:48 AM 12/21/2022    3:41 PM 06/29/2022    1:42 PM 06/15/2022    4:01 PM 06/01/2022   11:08 AM 12/14/2021    4:00 PM 05/11/2021    4:04 PM  PHQ 2/9 Scores  PHQ - 2 Score 5 5 5 6 6 4 4   PHQ- 9 Score 17 22 13 15 16 13 12     Fall Risk    06/13/2023    9:43 AM 12/21/2022    3:41 PM 06/29/2022    1:45 PM 06/15/2022    4:02 PM 06/01/2022   11:12 AM  Fall Risk   Falls in the  past year? 1 0 0 0 0  Number falls in past yr: 0  0 0 0  Injury with Fall? 0  0 0 0  Risk for fall due to :  No Fall Risks No Fall Risks No Fall Risks No Fall Risks  Follow up Falls evaluation completed;Education provided;Falls prevention discussed  Falls evaluation completed Falls evaluation completed Falls evaluation completed    MEDICARE RISK AT HOME: Medicare Risk at Home Any stairs in or around the home?: Yes If so, are there any without handrails?: No Home free of loose throw rugs in walkways, pet beds, electrical cords, etc?: Yes Adequate lighting in your home to reduce risk of falls?: Yes Life alert?: No Use of a cane, walker or w/c?: Yes Grab bars in the bathroom?: Yes Shower chair or bench in shower?: Yes Elevated toilet seat or a handicapped toilet?: No  TIMED UP AND GO:  Was the test performed?  No    Cognitive Function:        06/13/2023    9:45 AM  6CIT Screen  What Year? 0 points  What month? 0 points  What time? 0 points  Count back from 20 2 points  Months in reverse 0 points  Repeat phrase 0 points  Total Score 2 points    Immunizations Immunization History  Administered Date(s) Administered   PFIZER(Purple Top)SARS-COV-2 Vaccination 02/20/2020, 03/12/2020   Pneumococcal Polysaccharide-23 10/24/2018   Zoster, Live 08/15/2010    TDAP status: Due, Education has been provided regarding the importance of this vaccine. Advised may receive this vaccine at local pharmacy or Health Dept. Aware to provide a copy of the vaccination record if obtained from local pharmacy or Health Dept. Verbalized acceptance and understanding.  Flu Vaccine status: Due, Education has been provided regarding the importance of this vaccine. Advised may receive this vaccine at local pharmacy or Health Dept. Aware to provide a copy of the vaccination record if obtained from local pharmacy or Health Dept. Verbalized acceptance and understanding.  Pneumococcal vaccine status: Due,  Education has been provided regarding the importance of this vaccine. Advised may receive this vaccine at local pharmacy or Health Dept. Aware to provide a copy of the vaccination record if obtained from local pharmacy or Health Dept. Verbalized acceptance and understanding.  Covid-19 vaccine status: Information provided on how to obtain vaccines.   Qualifies for Shingles Vaccine? Yes   Zostavax completed No   Shingrix Completed?: Yes  Screening Tests Health Maintenance  Topic Date Due   COVID-19 Vaccine (3 - Pfizer risk series) 06/29/2023 (Originally 04/09/2020)   Zoster Vaccines- Shingrix (1 of 2) 09/13/2023 (Originally 10/18/1964)   INFLUENZA VACCINE  12/10/2023 (Originally 04/12/2023)   Pneumonia Vaccine 24+ Years old (2 of 2 - PCV) 06/12/2024 (Originally 10/25/2019)   Lung Cancer Screening  02/13/2024   Medicare Annual Wellness (  AWV)  06/12/2024   DEXA SCAN  Completed   Hepatitis C Screening  Completed   HPV VACCINES  Aged Out   DTaP/Tdap/Td  Discontinued   Colonoscopy  Discontinued    Health Maintenance  There are no preventive care reminders to display for this patient.   Colorectal cancer screening: No longer required.   Mammogram   Education provided  Bone Density status: Completed 2021. Results reflect: Bone density results: OSTEOPOROSIS. Repeat every   years.  Lung Cancer Screening: (Low Dose CT Chest recommended if Age 62-80 years, 20 pack-year currently smoking OR have quit w/in 15years.) does qualify.   Lung Cancer Screening Referral: due 2025  Additional Screening:  Hepatitis C Screening: does not qualify; Completed 2020  Vision Screening: Recommended annual ophthalmology exams for early detection of glaucoma and other disorders of the eye. Is the patient up to date with their annual eye exam?  No  Who is the provider or what is the name of the office in which the patient attends annual eye exams? Education provided If pt is not established with a provider,  would they like to be referred to a provider to establish care? No .   Dental Screening: Recommended annual dental exams for proper oral hygiene    Community Resource Referral / Chronic Care Management: CRR required this visit?  No   CCM required this visit?  No     Plan:     I have personally reviewed and noted the following in the patient's chart:   Medical and social history Use of alcohol, tobacco or illicit drugs  Current medications and supplements including opioid prescriptions. Patient is not currently taking opioid prescriptions. Functional ability and status Nutritional status Physical activity Advanced directives List of other physicians Hospitalizations, surgeries, and ER visits in previous 12 months Vitals Screenings to include cognitive, depression, and falls Referrals and appointments  In addition, I have reviewed and discussed with patient certain preventive protocols, quality metrics, and best practice recommendations. A written personalized care plan for preventive services as well as general preventive health recommendations were provided to patient.     Remi Haggard, LPN   16/09/958   After Visit Summary: (MyChart) Due to this being a telephonic visit, the after visit summary with patients personalized plan was offered to patient via MyChart   Nurse Notes:    Patient is post stroke  having issues at home, per notes patient has an appointment with Social Work at 1:30 today advised to answer phone.  Patient voiced understanding.    Patient Medication was sent to [facility],   Spoke with pharmacy they will transfer the Lovastin to [facility] in [place].     Called patient to let her know.

## 2023-06-13 NOTE — Telephone Encounter (Signed)
Received forms from [facility] Printed & placed in provider bin

## 2023-06-13 NOTE — Telephone Encounter (Signed)
Placed in folder at nurse station

## 2023-06-13 NOTE — Telephone Encounter (Signed)
Placed in sign folder as well

## 2023-06-14 NOTE — Patient Outreach (Signed)
Care Coordination   Initial Visit Note   06/13/2023 Name: [name] MRN: [id number] DOB: 1946-01-26  [name] is a 77 y.o. year old female who sees Shade Flood, MD for primary care. I spoke with  [name] by phone today.  What matters to the patients health and wellness today?  Housing and Level of Care    Goals Addressed             This Visit's Progress    Obtain Supportive Resources-Independent Living   On track    Activities and task to complete in order to accomplish goals.   Keep all upcoming appointments discussed today Continue with compliance of taking medication prescribed by Doctor Implement healthy coping skills discussed to assist with management of symptoms         SDOH assessments and interventions completed:  No     Care Coordination Interventions:  Yes, provided  Interventions Today    Flowsheet Row Most Recent Value  Chronic Disease   Chronic disease during today's visit Hypertension (HTN), Chronic Kidney Disease/End Stage Renal Disease (ESRD), Other  [MDD and GAD]  General Interventions   General Interventions Discussed/Reviewed General Interventions Discussed, Community Resources, Level of Care, Doctor Visits  Doctor Visits Discussed/Reviewed Doctor Visits Discussed  Level of Care Assisted Living, Skilled Nursing Facility, Personal Care Services  Mental Health Interventions   Mental Health Discussed/Reviewed Mental Health Discussed, Coping Strategies, Anxiety, Depression  Nutrition Interventions   Nutrition Discussed/Reviewed Nutrition Discussed  Pharmacy Interventions   Pharmacy Dicussed/Reviewed Pharmacy Topics Discussed, Medication Adherence  Safety Interventions   Safety Discussed/Reviewed Safety Discussed, Home Safety, Fall Risk       Follow up plan: Follow up call scheduled for 2-4 weeks    Encounter Outcome:  Patient Visit Completed   [name], MSW, LCSW [facility] Care Management [facility]  [facility] [email or web address] Phone [phone] 3:23 PM

## 2023-06-14 NOTE — Patient Instructions (Signed)
Visit Information  Thank you for taking time to visit with me today. Please don't hesitate to contact me if I can be of assistance to you.   Following are the goals we discussed today:   Goals Addressed             This Visit's Progress    Obtain Supportive Resources-Independent Living   On track    Activities and task to complete in order to accomplish goals.   Keep all upcoming appointments discussed today Continue with compliance of taking medication prescribed by Doctor Implement healthy coping skills discussed to assist with management of symptoms         Our next appointment is by telephone on 10/09 at 3 PM  Please call the care guide team at [phone] if you need to cancel or reschedule your appointment.   If you are experiencing a Mental Health or Behavioral Health Crisis or need someone to talk to, please call the Suicide and Crisis Lifeline: 988 call 911   Patient verbalizes understanding of instructions and care plan provided today and agrees to view in MyChart. Active MyChart status and patient understanding of how to access instructions and care plan via MyChart confirmed with patient.     [name], MSW, LCSW [facility] Care Management [place]  [facility] [email or web address] Phone [phone] 3:23 PM

## 2023-06-15 ENCOUNTER — Telehealth: Payer: Self-pay | Admitting: Licensed Clinical Social Worker

## 2023-06-15 DIAGNOSIS — F32A Depression, unspecified: Secondary | ICD-10-CM

## 2023-06-15 DIAGNOSIS — J8410 Pulmonary fibrosis, unspecified: Secondary | ICD-10-CM

## 2023-06-15 DIAGNOSIS — I69351 Hemiplegia and hemiparesis following cerebral infarction affecting right dominant side: Secondary | ICD-10-CM | POA: Diagnosis not present

## 2023-06-15 DIAGNOSIS — I639 Cerebral infarction, unspecified: Secondary | ICD-10-CM

## 2023-06-15 DIAGNOSIS — D329 Benign neoplasm of meninges, unspecified: Secondary | ICD-10-CM | POA: Diagnosis not present

## 2023-06-15 DIAGNOSIS — I739 Peripheral vascular disease, unspecified: Secondary | ICD-10-CM | POA: Diagnosis not present

## 2023-06-15 DIAGNOSIS — F411 Generalized anxiety disorder: Secondary | ICD-10-CM

## 2023-06-15 DIAGNOSIS — I779 Disorder of arteries and arterioles, unspecified: Secondary | ICD-10-CM | POA: Diagnosis not present

## 2023-06-15 DIAGNOSIS — F39 Unspecified mood [affective] disorder: Secondary | ICD-10-CM | POA: Diagnosis not present

## 2023-06-15 DIAGNOSIS — F319 Bipolar disorder, unspecified: Secondary | ICD-10-CM | POA: Diagnosis not present

## 2023-06-15 DIAGNOSIS — N183 Chronic kidney disease, stage 3 unspecified: Secondary | ICD-10-CM

## 2023-06-15 DIAGNOSIS — I10 Essential (primary) hypertension: Secondary | ICD-10-CM

## 2023-06-15 NOTE — Telephone Encounter (Signed)
Pt's son, [name], called trying to figure out what is going on with [facility]. He spoke to someone with [facility] who told him they had a spot for pt but we were told they do not have a spot. So he's wanting to know what is going on. He is in [place], and according to the son, the facility she was at prior discharged her under false pretenses saying that she was doing much better and could get up and walk around when that wasn't the case. He doesn't like the idea of his mom being at home alone and is anxious to get her into another home. Pt is having memory issues and he feels like he's not getting the complete story regarding her conversations with Dr. Neva Seat. He has requested if there is any way possible for Dr. Neva Seat to give him a call so he can get caught up to speed where everything stands with the facility arrangements and any paperwork that needs to be sent. He can be reached at [phone].  [phone]

## 2023-06-15 NOTE — Addendum Note (Signed)
Addended by: [name] R on: 06/15/2023 03:41 PM   Modules accepted: Orders

## 2023-06-15 NOTE — Telephone Encounter (Signed)
See other call, I completed paperwork for [facility] and FL 2 form was received, completed that information but plan to call patient to provide other specifics.  Will also gather some of this information from phone call with her son [name] below.  Verified [name] is on her DPR. He is at her residence now.   [name] is concerned that mother may not be providing all information needed. He is concerned about her status at home. Feels like she needs to be evaluated and more needs than when discharged from [place]. Would like to be admitted to [facility].   She is getting up and down better, with use of walker. Walk about 60 feet with supervision and walker.  No change in mental status in past few months, but some confusion at times.  Back smoking cigarettes since leaving [place] facility. No new focal weakness.   Darkening of fingers noted with numbness, fingers feel cold.  R hand all fingers dark on the end. Numb. New symptoms recently. Given this information.  No current discoloration, but darker earlier today - appeared be blue color earlier today. Appears normal now.  Vascular referral ordered with ER precautions.   FL2 completed with responses from patient and son.  Will advise [name].

## 2023-06-15 NOTE — Telephone Encounter (Signed)
FL2 form  completed and placed in fax bin at back nurse station - see other note.

## 2023-06-18 NOTE — Patient Outreach (Signed)
Care Coordination   Follow Up Visit Note   06/15/2023 Name: [name] MRN: [id number] DOB: 05/05/1946  [name] is a 77 y.o. year old female who sees Shade Flood, MD for primary care. I spoke with  Dennison Bulla son, [name], by phone today.  What matters to the patients health and wellness today?  Placement    Goals Addressed             This Visit's Progress    Obtain Supportive Resources-Independent Living   On track    Activities and task to complete in order to accomplish goals.   Keep all upcoming appointments discussed today Continue with compliance of taking medication prescribed by Doctor Implement healthy coping skills discussed to assist with management of symptoms F/up with Admissions Director at [facility] to discuss financial responsibilities/next steps for placement         SDOH assessments and interventions completed:  No     Care Coordination Interventions:  Yes, provided  Interventions Today    Flowsheet Row Most Recent Value  Chronic Disease   Chronic disease during today's visit Hypertension (HTN), Chronic Kidney Disease/End Stage Renal Disease (ESRD), Other  [MDD and GAD]  General Interventions   General Interventions Discussed/Reviewed General Interventions Reviewed, Level of Care, Doctor Visits  Doctor Visits Discussed/Reviewed Doctor Visits Reviewed  Level of Care Assisted Living, Personal Care Services  [facility] are paying out of pocket daily for aid to promote safety in pt's home]  Mental Health Interventions   Mental Health Discussed/Reviewed Mental Health Reviewed, Coping Strategies  [Validated caregiver strain and discussed strategies to assist with stress management]  Safety Interventions   Safety Discussed/Reviewed Safety Reviewed       Follow up plan: Follow up call scheduled for 06/18/23    Encounter Outcome:  Patient Visit Completed   [name], MSW, LCSW [facility] Care Management [facility]  [facility] [email or web address] Phone [phone] 9:06 AM

## 2023-06-18 NOTE — Patient Instructions (Signed)
Visit Information  Thank you for taking time to visit with me today. Please don't hesitate to contact me if I can be of assistance to you.   Following are the goals we discussed today:   Goals Addressed             This Visit's Progress    Obtain Supportive Resources-Independent Living   On track    Activities and task to complete in order to accomplish goals.   Keep all upcoming appointments discussed today Continue with compliance of taking medication prescribed by Doctor Implement healthy coping skills discussed to assist with management of symptoms F/up with Admissions Director at [facility] to discuss financial responsibilities/next steps for placement         Please call the care guide team at [phone] if you need to cancel or reschedule your appointment.   If you are experiencing a Mental Health or Behavioral Health Crisis or need someone to talk to, please call the Suicide and Crisis Lifeline: 988 call 911   Patient verbalizes understanding of instructions and care plan provided today and agrees to view in MyChart. Active MyChart status and patient understanding of how to access instructions and care plan via MyChart confirmed with patient.     [name], MSW, LCSW [facility] Care Management [place]  [facility] [email or web address] Phone [phone] 9:06 AM

## 2023-06-18 NOTE — Telephone Encounter (Signed)
Faxed on Friday 06/15/2023

## 2023-06-19 ENCOUNTER — Telehealth: Payer: Self-pay | Admitting: Licensed Clinical Social Worker

## 2023-06-19 DIAGNOSIS — I779 Disorder of arteries and arterioles, unspecified: Secondary | ICD-10-CM | POA: Diagnosis not present

## 2023-06-19 DIAGNOSIS — F319 Bipolar disorder, unspecified: Secondary | ICD-10-CM | POA: Diagnosis not present

## 2023-06-19 DIAGNOSIS — F39 Unspecified mood [affective] disorder: Secondary | ICD-10-CM | POA: Diagnosis not present

## 2023-06-19 DIAGNOSIS — D329 Benign neoplasm of meninges, unspecified: Secondary | ICD-10-CM | POA: Diagnosis not present

## 2023-06-19 DIAGNOSIS — I739 Peripheral vascular disease, unspecified: Secondary | ICD-10-CM | POA: Diagnosis not present

## 2023-06-19 DIAGNOSIS — I69351 Hemiplegia and hemiparesis following cerebral infarction affecting right dominant side: Secondary | ICD-10-CM | POA: Diagnosis not present

## 2023-06-19 NOTE — Patient Outreach (Addendum)
Care Coordination   Follow Up Visit Note   06/18/2023 Name: [name] MRN: [id number] DOB: 1945/12/15  [name] is a 77 y.o. year old female who sees Jenny Flood, MD for primary care. I spoke with  [name] son, [name], by phone today.  What matters to the patients health and wellness today?  Level of Care-Placement    Goals Addressed             This Visit's Progress    Obtain Supportive Resources-Independent Living   On track    Activities and task to complete in order to accomplish goals.   Keep all upcoming appointments discussed today Continue with compliance of taking medication prescribed by Doctor Implement healthy coping skills discussed to assist with management of symptoms F/up with Admissions Director at [facility] to discuss financial responsibilities/next steps for placement         SDOH assessments and interventions completed:  No     Care Coordination Interventions:  Yes, provided  Interventions Today    Flowsheet Row Most Recent Value  Chronic Disease   Chronic disease during today's visit Hypertension (HTN), Chronic Kidney Disease/End Stage Renal Disease (ESRD), Other  [MDD and GAD]  General Interventions   General Interventions Discussed/Reviewed Level of Care, General Interventions Reviewed  [Pt's son has left a message for facility SW, awaiting a callback. He was informed that FL2 was completed. LCSW will continue to make attempts to confirm form was received. Encouraged family to speak with Admissions Director to inquire about next steps]  Level of Care Assisted Living       Follow up plan: Follow up call scheduled for 06/20/23    Encounter Outcome:  Patient Visit Completed   [name], MSW, LCSW [facility] Care Management [facility]  [facility] [email or web address] Phone [phone] 12:38 PM

## 2023-06-19 NOTE — Patient Instructions (Signed)
Visit Information  Thank you for taking time to visit with me today. Please don't hesitate to contact me if I can be of assistance to you.   Following are the goals we discussed today:   Goals Addressed             This Visit's Progress    Obtain Supportive Resources-Independent Living   On track    Activities and task to complete in order to accomplish goals.   Keep all upcoming appointments discussed today Continue with compliance of taking medication prescribed by Doctor Implement healthy coping skills discussed to assist with management of symptoms F/up with Admissions Director at [facility] to discuss financial responsibilities/next steps for placement         Our next appointment is by telephone on 10/09   Please call the care guide team at [phone] if you need to cancel or reschedule your appointment.   If you are experiencing a Mental Health or Behavioral Health Crisis or need someone to talk to, please call the Suicide and Crisis Lifeline: 988 call 911   Patient verbalizes understanding of instructions and care plan provided today and agrees to view in MyChart. Active MyChart status and patient understanding of how to access instructions and care plan via MyChart confirmed with patient.     [name], MSW, LCSW [facility] Care Management [place]  [facility] [email or web address] Phone [phone] 12:39 PM

## 2023-06-20 ENCOUNTER — Ambulatory Visit: Payer: Self-pay | Admitting: Licensed Clinical Social Worker

## 2023-06-21 DIAGNOSIS — I129 Hypertensive chronic kidney disease with stage 1 through stage 4 chronic kidney disease, or unspecified chronic kidney disease: Secondary | ICD-10-CM | POA: Diagnosis not present

## 2023-06-21 DIAGNOSIS — N183 Chronic kidney disease, stage 3 unspecified: Secondary | ICD-10-CM | POA: Diagnosis not present

## 2023-06-21 DIAGNOSIS — B3731 Acute candidiasis of vulva and vagina: Secondary | ICD-10-CM | POA: Diagnosis not present

## 2023-06-21 DIAGNOSIS — M1990 Unspecified osteoarthritis, unspecified site: Secondary | ICD-10-CM | POA: Diagnosis not present

## 2023-06-21 DIAGNOSIS — I69322 Dysarthria following cerebral infarction: Secondary | ICD-10-CM | POA: Diagnosis not present

## 2023-06-21 DIAGNOSIS — D329 Benign neoplasm of meninges, unspecified: Secondary | ICD-10-CM | POA: Diagnosis not present

## 2023-06-21 DIAGNOSIS — K5904 Chronic idiopathic constipation: Secondary | ICD-10-CM | POA: Diagnosis not present

## 2023-06-21 DIAGNOSIS — I739 Peripheral vascular disease, unspecified: Secondary | ICD-10-CM | POA: Diagnosis not present

## 2023-06-21 DIAGNOSIS — K219 Gastro-esophageal reflux disease without esophagitis: Secondary | ICD-10-CM | POA: Diagnosis not present

## 2023-06-21 DIAGNOSIS — E039 Hypothyroidism, unspecified: Secondary | ICD-10-CM | POA: Diagnosis not present

## 2023-06-21 DIAGNOSIS — F17210 Nicotine dependence, cigarettes, uncomplicated: Secondary | ICD-10-CM | POA: Diagnosis not present

## 2023-06-21 DIAGNOSIS — F39 Unspecified mood [affective] disorder: Secondary | ICD-10-CM | POA: Diagnosis not present

## 2023-06-21 DIAGNOSIS — I779 Disorder of arteries and arterioles, unspecified: Secondary | ICD-10-CM | POA: Diagnosis not present

## 2023-06-21 DIAGNOSIS — D631 Anemia in chronic kidney disease: Secondary | ICD-10-CM | POA: Diagnosis not present

## 2023-06-21 DIAGNOSIS — L409 Psoriasis, unspecified: Secondary | ICD-10-CM | POA: Diagnosis not present

## 2023-06-21 DIAGNOSIS — I150 Renovascular hypertension: Secondary | ICD-10-CM | POA: Diagnosis not present

## 2023-06-21 DIAGNOSIS — I69351 Hemiplegia and hemiparesis following cerebral infarction affecting right dominant side: Secondary | ICD-10-CM | POA: Diagnosis not present

## 2023-06-21 DIAGNOSIS — I714 Abdominal aortic aneurysm, without rupture, unspecified: Secondary | ICD-10-CM | POA: Diagnosis not present

## 2023-06-21 DIAGNOSIS — F319 Bipolar disorder, unspecified: Secondary | ICD-10-CM | POA: Diagnosis not present

## 2023-06-21 DIAGNOSIS — G4700 Insomnia, unspecified: Secondary | ICD-10-CM | POA: Diagnosis not present

## 2023-06-21 NOTE — Telephone Encounter (Signed)
[name] who is aiding this patient and her family in getting the resources she needs is requesting we fax this patients FL2 form to [facility], [facility] and [facility] in [place].   Unfortunately I went to find the [facility] we had faxed last week but looks like it has already gone out, I hate to ask but would you please fill this out again and I can fax to these facilities   Son [name] is asking that we hurry as the place she is staying currently is pricey.

## 2023-06-21 NOTE — Telephone Encounter (Signed)
Located FL2 in the charge entry folder, will fax this original to the requested facilities

## 2023-06-21 NOTE — Telephone Encounter (Signed)
Noted  

## 2023-06-22 ENCOUNTER — Encounter: Payer: Self-pay | Admitting: Licensed Clinical Social Worker

## 2023-06-22 NOTE — Patient Outreach (Signed)
Care Coordination   Follow Up Visit Note   06/20/2023 Name: [name] MRN: [id number] DOB: 1945-11-15  [name] is a 77 y.o. year old female who sees Shade Flood, MD for primary care. I spoke with  [name] by phone today.  What matters to the patients health and wellness today?  Placement    Goals Addressed             This Visit's Progress    Obtain Supportive Resources-Independent Living   On track    Activities and task to complete in order to accomplish goals.   Keep all upcoming appointments discussed today Continue with compliance of taking medication prescribed by Doctor Implement healthy coping skills discussed to assist with management of symptoms F/up with Admissions Director at [facility] [place] to discuss financial responsibilities/next steps for placement         SDOH assessments and interventions completed:  No     Care Coordination Interventions:  Yes, provided  Interventions Today    Flowsheet Row Most Recent Value  Chronic Disease   Chronic disease during today's visit Hypertension (HTN), Chronic Kidney Disease/End Stage Renal Disease (ESRD), Other  [MDD and GAD]  General Interventions   General Interventions Discussed/Reviewed General Interventions Reviewed, Level of Care  Level of Care --  [Pt's son f/up with facility SW. Facility has not received FL2.]  Mental Health Interventions   Mental Health Discussed/Reviewed Mental Health Reviewed  [Caregiver stress acknowledged. Spending $700 every 2 days, in need of immediate placement]       Follow up plan: Follow up call scheduled for 1 week    Encounter Outcome:  Patient Visit Completed   [name], MSW, LCSW [facility] Care Management [facility]  [facility] [email or web address] Phone [phone] 5:31 AM

## 2023-06-22 NOTE — Patient Instructions (Signed)
Visit Information  Thank you for taking time to visit with me today. Please don't hesitate to contact me if I can be of assistance to you.   Following are the goals we discussed today:   Goals Addressed             This Visit's Progress    Obtain Supportive Resources-Independent Living   On track    Activities and task to complete in order to accomplish goals.   Keep all upcoming appointments discussed today Continue with compliance of taking medication prescribed by Doctor Implement healthy coping skills discussed to assist with management of symptoms F/up with Admissions Director at [facility] to discuss financial responsibilities/next steps for placement         Please call the care guide team at [phone] if you need to cancel or reschedule your appointment.   If you are experiencing a Mental Health or Behavioral Health Crisis or need someone to talk to, please call the Suicide and Crisis Lifeline: 988 call 911   Patient verbalizes understanding of instructions and care plan provided today and agrees to view in MyChart. Active MyChart status and patient understanding of how to access instructions and care plan via MyChart confirmed with patient.     [name], MSW, LCSW [facility] Care Management [place]  [facility] [email or web address] Phone [phone] 5:32 AM

## 2023-06-25 ENCOUNTER — Telehealth: Payer: Self-pay | Admitting: Family Medicine

## 2023-06-25 ENCOUNTER — Encounter: Payer: Self-pay | Admitting: Licensed Clinical Social Worker

## 2023-06-25 DIAGNOSIS — D329 Benign neoplasm of meninges, unspecified: Secondary | ICD-10-CM | POA: Diagnosis not present

## 2023-06-25 DIAGNOSIS — I739 Peripheral vascular disease, unspecified: Secondary | ICD-10-CM | POA: Diagnosis not present

## 2023-06-25 DIAGNOSIS — F319 Bipolar disorder, unspecified: Secondary | ICD-10-CM | POA: Diagnosis not present

## 2023-06-25 DIAGNOSIS — F39 Unspecified mood [affective] disorder: Secondary | ICD-10-CM | POA: Diagnosis not present

## 2023-06-25 DIAGNOSIS — I779 Disorder of arteries and arterioles, unspecified: Secondary | ICD-10-CM | POA: Diagnosis not present

## 2023-06-25 DIAGNOSIS — I69351 Hemiplegia and hemiparesis following cerebral infarction affecting right dominant side: Secondary | ICD-10-CM | POA: Diagnosis not present

## 2023-06-25 NOTE — Telephone Encounter (Signed)
Son has spent over $4,000 in 24-hr care since he last spoke to Dr. Neva Seat, social worker is not communicating with the social worker at [facility]. Social worker at [facility] she just received FL-2 on 06/22/23 but still needs more info and more progress notes. Son is spending $4,000 every 3 days and he doesn't feel like he's even halfway through the process. When he tries to get ahold of the Child psychotherapist at [facility] the phone continuously rings and she will not return his phone calls. She knows how much he is spending on care for his mother so she doesn't show any sense of urgency.

## 2023-06-25 NOTE — Telephone Encounter (Signed)
Per message from Isabel "I faxed over Dr. Paralee Cancel recent MD note and a copy of pt's med list early this morning (06/25/23) per [facility] request. At this time there is nothing additional we can do, I encouraged him last week to reach out to admission coordinators from preferred facilities, so that they can reach out to him directly with updates regarding bed availability"  No additional actions can be taken at this time.

## 2023-06-25 NOTE — Telephone Encounter (Signed)
I spoke with the son who was very agitated and gave him the best update that I could, FL2 was sent to 3 locations last week Wednesday, and we are doing what we can. I asked that he has patience as this can be a lengthy process. But assured him I would get his update (written by [facility]) as well as the information I had to both Dr Neva Seat and [name] the social worker on her case.   Please advise if there is anything to do to expedite this process

## 2023-06-25 NOTE — Patient Outreach (Signed)
Care Coordination   Collaboration  Visit Note   06/22/2023 Name: DEARIA TRUMBAUER MRN: [id number] DOB: 1946-04-17  [name] is a 77 y.o. year old female who sees Shade Flood, MD for primary care. I  engaged with Whitney with [facility]  What matters to the patients health and wellness today?  Patient was not engaged during this encounter    SDOH assessments and interventions completed:  No     Care Coordination Interventions:  Yes, provided  Interventions Today    Flowsheet Row Most Recent Value  Chronic Disease   Chronic disease during today's visit Hypertension (HTN), Chronic Kidney Disease/End Stage Renal Disease (ESRD)  General Interventions   General Interventions Discussed/Reviewed Communication with  Communication with --  [employer] collaborated with [employer] with [facility]. Requested recent MD note and med list to be faxed Monday, 10/14 for review for placement. Whitney confirmed pt's son has been updated]       Follow up plan:  Whitney will f/up with LCSW with any additional ppwk needs, questions, or concerns    Encounter Outcome:  Patient Visit Completed   [name], MSW, LCSW [facility] Care Management [facility]  [facility] [email or web address] Phone [phone] 6:47 PM

## 2023-06-26 ENCOUNTER — Telehealth: Payer: Self-pay | Admitting: Licensed Clinical Social Worker

## 2023-06-26 DIAGNOSIS — I739 Peripheral vascular disease, unspecified: Secondary | ICD-10-CM | POA: Diagnosis not present

## 2023-06-26 DIAGNOSIS — F319 Bipolar disorder, unspecified: Secondary | ICD-10-CM | POA: Diagnosis not present

## 2023-06-26 DIAGNOSIS — F39 Unspecified mood [affective] disorder: Secondary | ICD-10-CM | POA: Diagnosis not present

## 2023-06-26 DIAGNOSIS — D329 Benign neoplasm of meninges, unspecified: Secondary | ICD-10-CM | POA: Diagnosis not present

## 2023-06-26 DIAGNOSIS — I69351 Hemiplegia and hemiparesis following cerebral infarction affecting right dominant side: Secondary | ICD-10-CM | POA: Diagnosis not present

## 2023-06-26 DIAGNOSIS — I779 Disorder of arteries and arterioles, unspecified: Secondary | ICD-10-CM | POA: Diagnosis not present

## 2023-06-26 NOTE — Patient Outreach (Signed)
Care Coordination   Follow Up Visit Note   06/26/2023 Name: [name] MRN: [id number] DOB: 08-10-1946  [name] is a 77 y.o. year old female who sees Jenny Flood, MD for primary care. I spoke with  [name] son, [name], by phone today.  What matters to the patients health and wellness today?  Level of care-Placement    Goals Addressed             This Visit's Progress    Obtain Supportive Resources-Independent Living   On track    Activities and task to complete in order to accomplish goals.   Keep all upcoming appointments discussed today Continue with compliance of taking medication prescribed by Doctor Implement healthy coping skills discussed to assist with management of symptoms F/up with Admissions Director at preferred facilities to discuss financial responsibilities/next steps for placement         SDOH assessments and interventions completed:  No     Care Coordination Interventions:  Yes, provided  Interventions Today    Flowsheet Row Most Recent Value  Chronic Disease   Chronic disease during today's visit Hypertension (HTN), Chronic Kidney Disease/End Stage Renal Disease (ESRD)  General Interventions   General Interventions Discussed/Reviewed Level of Care, Communication with  [LCSW attempted to provide an update with pt's son, in addition, to discuss strategies to assist with placement to preferred facilities. Pt's son proceeded to curse and continue to interrupt LCSW during call. Son disconnected the call shortly afterwards.]  Communication with RN, PCP/Specialists  [LCSW collaborated with PCP's CMA, who confirmed that medical paperwork has been faxed (and re-faxed) to family's preferred facilities. CMA shared that pt's son was upset about communication with him. LCSW messaged PCP with update on placement]       Follow up plan:  LCSW will provide update to PCP and CMA to obtain next steps    Encounter Outcome:  Patient Visit  Completed   [name], MSW, LCSW [facility] Care Management [facility] [place] [facility]  [facility] [email or web address] Phone [phone] 9:54 AM

## 2023-06-26 NOTE — Patient Instructions (Signed)
Visit Information  Thank you for taking time to visit with me today. Please don't hesitate to contact me if I can be of assistance to you.   Following are the goals we discussed today:   Goals Addressed             This Visit's Progress    Obtain Supportive Resources-Independent Living   On track    Activities and task to complete in order to accomplish goals.   Keep all upcoming appointments discussed today Continue with compliance of taking medication prescribed by Doctor Implement healthy coping skills discussed to assist with management of symptoms F/up with Admissions Director at preferred facilities to discuss financial responsibilities/next steps for placement         Please call the care guide team at [phone] if you need to cancel or reschedule your appointment.   If you are experiencing a Mental Health or Behavioral Health Crisis or need someone to talk to, please call the Suicide and Crisis Lifeline: 988 call 911   Patient verbalizes understanding of instructions and care plan provided today and agrees to view in MyChart. Active MyChart status and patient understanding of how to access instructions and care plan via MyChart confirmed with patient.     [name], MSW, LCSW [facility] Care Management [place]  [facility] [email or web address] Phone [phone] 9:54 AM

## 2023-06-26 NOTE — Telephone Encounter (Signed)
I am very sorry to hear that they are having this difficulty.  It looks like there was a message from [facility] yesterday that she would be reaching out to patient's son today.  Anything else I can do to help in the process?  Do they need any additional documentation from me?

## 2023-06-26 NOTE — Patient Outreach (Signed)
Care Coordination   Follow Up Visit Note   06/25/2023 Name: TYINA HAMMERLE MRN: [id number] DOB: 24-Jan-1946  [name] is a 77 y.o. year old female who sees Shade Flood, MD for primary care. I  engaged with [facility] and PCP CMA  What matters to the patients health and wellness today?  Placement    SDOH assessments and interventions completed:  No     Care Coordination Interventions:  Yes, provided  Interventions Today    Flowsheet Row Most Recent Value  Chronic Disease   Chronic disease during today's visit Hypertension (HTN), Chronic Kidney Disease/End Stage Renal Disease (ESRD)  General Interventions   General Interventions Discussed/Reviewed Communication with  Communication with Social Work  [employer] faxed over recent PCP note and updated med list to [facility], Admissions with [employer]. Provided update with PCP CMA]       Follow up plan:  LCSW will f/up with pt's son on 10/15    Encounter Outcome:  Patient Visit Completed   [name], MSW, LCSW [facility] Care Management [facility] Health  [facility] [email or web address] Phone [phone] 12:00 PM

## 2023-06-27 ENCOUNTER — Telehealth: Payer: Self-pay | Admitting: Family Medicine

## 2023-06-27 NOTE — Telephone Encounter (Signed)
Called [name] regarding his concerns.  He is trying to get his mother into a care facility. Frustrated with the time to get his mother into facilities and costs that he has incurred over the past 12 days.  He does admit to frustration with call with [facility], but denies any use of expletives.   He spoke with Whitney at [facility] few days ago and reports that they needed progress reports from [facility]. Was contacted yesterday from [facility], and advised she would not be accepted at their facility.  2 other facilities are possible. [facility] and [facility].  He has not had any contact with those facilities, but was under the impression that the social worker would be sending information to those facilities. He does not want to work with same Child psychotherapist at this point.   I will work with staff tomorrow to get in touch with [facility] care mgt to see how we can be of assistance at this point. Wendi Snipes that we should have updates in the next 48 hours. He appreciated my call.

## 2023-06-27 NOTE — Telephone Encounter (Signed)
Devoria Albe after hours, He would like from the doctorvand not the nurse

## 2023-06-27 NOTE — Telephone Encounter (Signed)
Client [facility] [place] Night - C Client Site [facility] - Night Provider [name]- MD Contact Type Call Who Is Calling Patient / Member / Family / Caregiver Caller Name [name] Caller Phone Number [phone] Patient Name [name] Patient DOB 1946-04-20 Call Type Message Only Information Provided Reason for Call Request to Speak to a Physician Initial Comment Caller states he would like for Dr. Neva Seat to give him a call back. Additional Comment Caller states he would like for Dr. Neva Seat to give him a call back. He would like for the doctor and not the nurse to give him a callback. Disp. Time Disposition Final User 06/26/2023 3:14:04 PM General Information Provided Yes [place], Ladona Ridgel Call Closed By: Mordecai Rasmussen Transaction Date/Time: 06/26/2023 3:11:35 PM (ET)

## 2023-06-28 ENCOUNTER — Encounter: Payer: Self-pay | Admitting: Licensed Clinical Social Worker

## 2023-06-28 NOTE — Patient Instructions (Signed)
Visit Information  Thank you for taking time to visit with me today. Please don't hesitate to contact me if I can be of assistance to you.   Following are the goals we discussed today:   Goals Addressed             This Visit's Progress    Obtain Supportive Resources-Independent Living   On track    Activities and task to complete in order to accomplish goals.   Keep all upcoming appointments discussed today Continue with compliance of taking medication prescribed by Doctor Implement healthy coping skills discussed to assist with management of symptoms F/up with Admissions Director at preferred facilities to discuss financial responsibilities/next steps for placement         Please call the care guide team at [phone] if you need to cancel or reschedule your appointment.   If you are experiencing a Mental Health or Behavioral Health Crisis or need someone to talk to, please call the Suicide and Crisis Lifeline: 988 call 911    [name], MSW, LCSW [facility] Care Management [facility] Health  [facility] [email or web address] Phone [phone] 6:55 PM

## 2023-06-28 NOTE — Patient Outreach (Signed)
Care Coordination   Collaborative  Visit Note   06/28/2023 Name: NAHOMI ZASADA MRN: [id number] DOB: [birth year]  [name] is a 77 y.o. year old female who sees Shade Flood, MD for primary care. I  engaged with Practice Administrator, Lenard Simmer  What matters to the patients health and wellness today?  LCSW provided update to pt's son via email    Goals Addressed             This Visit's Progress    Obtain Supportive Resources-Independent Living   On track    Activities and task to complete in order to accomplish goals.   Keep all upcoming appointments discussed today Continue with compliance of taking medication prescribed by Doctor Implement healthy coping skills discussed to assist with management of symptoms F/up with Admissions Director at preferred facilities to discuss financial responsibilities/next steps for placement         SDOH assessments and interventions completed:  No     Care Coordination Interventions:  Yes, provided  Interventions Today    Flowsheet Row Most Recent Value  Chronic Disease   Chronic disease during today's visit Hypertension (HTN), Chronic Kidney Disease/End Stage Renal Disease (ESRD)  General Interventions   General Interventions Discussed/Reviewed Communication with, Level of Care  Communication with PCP/Specialists  [LCSW collaborated with Research officer, political party at PCP office. Provided update on status of placement. Administrator will f/up on preferred agencies to support pt goals of placement]  Level of Care Skilled Nursing Facility  [Provided update to pt's son via email. Per request, LCSW will not contact/speak with admission coordinators of preferred agencies]       Follow up plan:  LCSW will assist as needed    Encounter Outcome:  Patient Visit Completed   [name], MSW, LCSW [facility] Care Management [employer] Health  [facility] [email or web address] Phone [phone] 6:54 PM

## 2023-06-28 NOTE — Telephone Encounter (Signed)
I have spoken to [name], Social worker to gather further information as well as [name] and Dr. Neva [name]. Per [name], patient's son, did reach out to her via email and she planned to respond to him today. I did let her know that she could let him know that she has spoken with me and that I am contacting all three facilities to see if I can obtain more black and white info that I can get to [name]. I know that he has went back and forth with multiple people and is frustrated understandably. I am attempting to alleviate some of that!  I called [name] [phone] and had to leave a voicemail for [employer] (Child psychotherapist). I am needing to confirm that they DID receive an FL-2 form for placement of [name]. I am needing to confirm that they do or do not have room for her. I am needing to confirm exactly what they are needing from our office at this time, if anything. Should we fax anything please email it to yourself first and forward it from there so that we receive a confirmation page and please be sure to document the fax information of where things are sent in this encounter. [name]'s direct fax is [phone]. Please clarify next steps on getting this patient placed so that we can let the patient know what steps are in plain black and white.   I called [facility], [phone], and DID confirm that they did NOT receive an FL-2 for her. Please fax this to them tomorrow, 06/29/2023.  with last office note, demographics, labs and med list. Fax to [phone]. Attention: NEW ADMISSION. Please email to yourself first and forward from there so that we get a confirmation page. Please clarify what are next steps in getting her placed so that we can inform the son.   I called [name], [phone], and left a voicemail asking for someone to return my call regarding an FL-2 form for this patient. Asking for confirmation that they received it. If they did not please refax and document fax info.  As stated above, please email to yourself first and forward from there so we get a confirmation page. Please clarify what they do and don't need from [place] and clarify steps on getting this patient placed.   Key Takeaways: Document any changes on this encounter Confirm FL2 form was received Fax/Refax what's needed. Clarify the exact needs. Document fax information. (Fax number, contact person) When faxing, email to yourself first, please. Then forward the email to the fax number provided. This will give [place] a confirmation page.  Confirm if there is room for this patient at the facility. Clarify next steps of how to have her placed and communicate this with her son, [name].   Once this has been completed, patient's son needs to know that anything after this is his responsibility. We have done our part when it comes to communicating with the facilities and or reaching out to them. We have sent what has been requested and clarified next steps. He will need to stay in contact with the facility of their choice and we will always be happy to submit whatever is needed to them if anything is needed further.

## 2023-06-29 NOTE — Telephone Encounter (Signed)
FL2 last visit note, medication list and demographics have been sent (there are no recent labs from Dr Neva Seat available to send only specialists labs) Sent the documents to the requested fax for [facility] [phone] waiting on confirmation page from Email. No call back from [facility] or from [facility] at this time

## 2023-06-29 NOTE — Telephone Encounter (Signed)
Has FL 2 been faxed to [facility]?

## 2023-06-29 NOTE — Telephone Encounter (Signed)
FL2 last visit note, medication list and demographics have been sent no recent labs from Dr Neva Seat available to send, sent to the requested fax for [facility] [phone] waiting on confirmation page

## 2023-06-29 NOTE — Telephone Encounter (Signed)
Noted. Appreciate all the assistance provided.  When speaking to Moro on Wednesday I advised him that I would update him within 48 hours.   I called him and let him know of the current progress, and that he should contact [facility] and [facility] moving forward to make sure they have what they need from [place] and to advise [place] if any deficiencies.  Once we have sent our information, I recommend he continue communication with those 2 facilities to move forward.  Understanding expressed and thanked me for my call.

## 2023-07-02 DIAGNOSIS — F319 Bipolar disorder, unspecified: Secondary | ICD-10-CM | POA: Diagnosis not present

## 2023-07-02 DIAGNOSIS — I69351 Hemiplegia and hemiparesis following cerebral infarction affecting right dominant side: Secondary | ICD-10-CM | POA: Diagnosis not present

## 2023-07-02 DIAGNOSIS — F39 Unspecified mood [affective] disorder: Secondary | ICD-10-CM | POA: Diagnosis not present

## 2023-07-02 DIAGNOSIS — I739 Peripheral vascular disease, unspecified: Secondary | ICD-10-CM | POA: Diagnosis not present

## 2023-07-02 DIAGNOSIS — I779 Disorder of arteries and arterioles, unspecified: Secondary | ICD-10-CM | POA: Diagnosis not present

## 2023-07-02 DIAGNOSIS — D329 Benign neoplasm of meninges, unspecified: Secondary | ICD-10-CM | POA: Diagnosis not present

## 2023-07-03 ENCOUNTER — Encounter: Payer: Self-pay | Admitting: Psychiatry

## 2023-07-03 ENCOUNTER — Telehealth: Payer: Self-pay | Admitting: Psychiatry

## 2023-07-03 ENCOUNTER — Ambulatory Visit (INDEPENDENT_AMBULATORY_CARE_PROVIDER_SITE_OTHER): Payer: Medicare Other | Admitting: Psychiatry

## 2023-07-03 DIAGNOSIS — Z8673 Personal history of transient ischemic attack (TIA), and cerebral infarction without residual deficits: Secondary | ICD-10-CM

## 2023-07-03 DIAGNOSIS — F331 Major depressive disorder, recurrent, moderate: Secondary | ICD-10-CM | POA: Diagnosis not present

## 2023-07-03 DIAGNOSIS — G4721 Circadian rhythm sleep disorder, delayed sleep phase type: Secondary | ICD-10-CM

## 2023-07-03 DIAGNOSIS — J84112 Idiopathic pulmonary fibrosis: Secondary | ICD-10-CM

## 2023-07-03 DIAGNOSIS — F411 Generalized anxiety disorder: Secondary | ICD-10-CM

## 2023-07-03 DIAGNOSIS — G3184 Mild cognitive impairment, so stated: Secondary | ICD-10-CM | POA: Diagnosis not present

## 2023-07-03 DIAGNOSIS — Z91148 Patient's other noncompliance with medication regimen for other reason: Secondary | ICD-10-CM | POA: Diagnosis not present

## 2023-07-03 NOTE — Telephone Encounter (Signed)
Pt called at 2:17p.  She states since she had her stroke she has not started back up on her medications.  She had an appt with Mardelle Matte today, but wanted appt ASAP with Dr Jennelle Human. Next appt for Dr Jennelle Human is not until Dec.  She has been added to the wait list. Pls call her to discuss starting back up on her meds.   Next appt 12/18
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
## 2023-07-04 ENCOUNTER — Other Ambulatory Visit: Payer: Self-pay | Admitting: Psychiatry

## 2023-07-04 MED ORDER — DULOXETINE HCL 30 MG PO CPEP: 30.0000 mg | ORAL_CAPSULE | Freq: Every day | ORAL | 0 refills | Status: DC

## 2023-07-04 NOTE — Telephone Encounter (Signed)
Patient notified and Rx sent to the requested pharmacy.  ?

## 2023-07-04 NOTE — Telephone Encounter (Signed)
Patient had a stroke in June and said she has not been on her medications since then. She wanted to know how to restart them. I called patient and she said her memory is bad and she doesn't know what medications she was on. She has FU in Dec, but is on the waiting list. Has appt with Mardelle Matte next week.  From last note her medications are:  ? Saphris SL 2.5 - I know we had tried a couple of times to get her this Clonazepam 0.25-0.5 BID prn Ropinirole 3 mg Duloxetine 30 mg

## 2023-07-04 NOTE — Progress Notes (Signed)
Wants to resume psych meds after her last stroke.  Will keep it simple if possible. Resume duloxetine 30 mg cap , 1 daily.  [name], MD, DFAPA

## 2023-07-04 NOTE — Telephone Encounter (Signed)
Wants to resume psych meds after her last stroke.  Will keep it simple if possible. Resume duloxetine 30 mg cap , 1 daily. Will likely need RX, #90, no refill [name], MD, DFAPA

## 2023-07-05 DIAGNOSIS — F39 Unspecified mood [affective] disorder: Secondary | ICD-10-CM | POA: Diagnosis not present

## 2023-07-05 DIAGNOSIS — I779 Disorder of arteries and arterioles, unspecified: Secondary | ICD-10-CM | POA: Diagnosis not present

## 2023-07-05 DIAGNOSIS — D329 Benign neoplasm of meninges, unspecified: Secondary | ICD-10-CM | POA: Diagnosis not present

## 2023-07-05 DIAGNOSIS — I69351 Hemiplegia and hemiparesis following cerebral infarction affecting right dominant side: Secondary | ICD-10-CM | POA: Diagnosis not present

## 2023-07-05 DIAGNOSIS — I739 Peripheral vascular disease, unspecified: Secondary | ICD-10-CM | POA: Diagnosis not present

## 2023-07-05 DIAGNOSIS — F319 Bipolar disorder, unspecified: Secondary | ICD-10-CM | POA: Diagnosis not present

## 2023-07-09 DIAGNOSIS — F319 Bipolar disorder, unspecified: Secondary | ICD-10-CM | POA: Diagnosis not present

## 2023-07-09 DIAGNOSIS — I779 Disorder of arteries and arterioles, unspecified: Secondary | ICD-10-CM | POA: Diagnosis not present

## 2023-07-09 DIAGNOSIS — D329 Benign neoplasm of meninges, unspecified: Secondary | ICD-10-CM | POA: Diagnosis not present

## 2023-07-09 DIAGNOSIS — I69351 Hemiplegia and hemiparesis following cerebral infarction affecting right dominant side: Secondary | ICD-10-CM | POA: Diagnosis not present

## 2023-07-09 DIAGNOSIS — I739 Peripheral vascular disease, unspecified: Secondary | ICD-10-CM | POA: Diagnosis not present

## 2023-07-09 DIAGNOSIS — F39 Unspecified mood [affective] disorder: Secondary | ICD-10-CM | POA: Diagnosis not present

## 2023-07-10 ENCOUNTER — Ambulatory Visit (INDEPENDENT_AMBULATORY_CARE_PROVIDER_SITE_OTHER): Payer: Self-pay | Admitting: Psychiatry

## 2023-07-10 DIAGNOSIS — I69351 Hemiplegia and hemiparesis following cerebral infarction affecting right dominant side: Secondary | ICD-10-CM | POA: Diagnosis not present

## 2023-07-10 DIAGNOSIS — I779 Disorder of arteries and arterioles, unspecified: Secondary | ICD-10-CM | POA: Diagnosis not present

## 2023-07-10 DIAGNOSIS — F319 Bipolar disorder, unspecified: Secondary | ICD-10-CM | POA: Diagnosis not present

## 2023-07-10 DIAGNOSIS — I739 Peripheral vascular disease, unspecified: Secondary | ICD-10-CM | POA: Diagnosis not present

## 2023-07-10 DIAGNOSIS — Z91199 Patient's noncompliance with other medical treatment and regimen due to unspecified reason: Secondary | ICD-10-CM

## 2023-07-10 DIAGNOSIS — F39 Unspecified mood [affective] disorder: Secondary | ICD-10-CM | POA: Diagnosis not present

## 2023-07-10 DIAGNOSIS — D329 Benign neoplasm of meninges, unspecified: Secondary | ICD-10-CM | POA: Diagnosis not present

## 2023-07-10 NOTE — Progress Notes (Deleted)
Psychotherapy Progress Note [facility] Marliss Czar, PhD LP  Patient ID: [name])    MRN: [id number] Therapy format: Individual psychotherapy Date: 07/10/2023      Start: 1:06p     Stop: ***:***     Time Spent: *** min Location: Telehealth visit -- I connected with this patient by an approved telecommunication method (audio only), with her informed consent, and verifying identity and patient privacy.  I was located at my office and patient at her home.  As needed, we discussed the limitations, risks, and security and privacy concerns associated with telehealth service, including the availability and conditions which currently govern in-person appointments and the possibility that 3rd-party payment may not be fully guaranteed and she may be responsible for charges.  After she indicated understanding, we proceeded with the session.  Also discussed treatment planning, as needed, including ongoing verbal agreement with the plan, the opportunity to ask and answer all questions, her demonstrated understanding of instructions, and her readiness to call the office should symptoms worsen or she feels she is in a crisis state and needs more immediate and tangible assistance.   Session narrative (presenting needs, interim history, self-report of stressors and symptoms, applications of prior therapy, status changes, and interventions made in session) Call made as scheduled, call not answered, LM identifying myself with suggestion to call back or I will call back in 10-15 min.  Review of EHR shows she did follow through as planned last week, calling in for Dr. Jennelle Human and resumption of psychiatry med service.  Return call 15 min later also not answered, also LM.  Therapeutic modalities: {AM:23362::"Cognitive Behavioral Therapy","Solution-Oriented/Positive Psychology"}  Mental Status/Observations:  Appearance:   {PSY:22683}     Behavior:  {PSY:21022743}  Motor:  {PSY:22302}   Speech/Language:   {PSY:22685}  Affect:  {PSY:22687}  Mood:  {PSY:31886}  Thought process:  {PSY:31888}  Thought content:    {PSY:[phone]}  Sensory/Perceptual disturbances:    {PSY:386-681-3704}  Orientation:  {Psych Orientation:23301::"Fully oriented"}  Attention:  {Good-Fair-Poor ratings:23770::"Good"}    Concentration:  {Good-Fair-Poor ratings:23770::"Good"}  Memory:  {PSY:336 231 1198}  Insight:    {Good-Fair-Poor ratings:23770::"Good"}  Judgment:   {Good-Fair-Poor ratings:23770::"Good"}  Impulse Control:  {Good-Fair-Poor ratings:23770::"Good"}   Risk Assessment: Danger to Self: {Risk:22599::"No"} Self-injurious Behavior: {Risk:22599::"No"} Danger to Others: {Risk:22599::"No"} Physical Aggression / Violence: {Risk:22599::"No"} Duty to Warn: {AMYesNo:22526::"No"} Access to Firearms a concern: {AMYesNo:22526::"No"}  Assessment of progress:  {Progress:22147::"progressing"}  Diagnosis: No diagnosis found. Plan:  *** Other recommendations/advice -- As may be noted above.  Continue to utilize previously learned skills ad lib. Medication compliance -- Maintain medication as prescribed and work faithfully with relevant prescriber(s) if any changes are desired or seem indicated. Crisis service -- Aware of call list and work-in appts.  Call the clinic on-call service, 988/hotline, 911, or present to [facility] or ER if any life-threatening psychiatric crisis. Followup -- No follow-ups on file.  Next scheduled visit with me 08/31/2023.  Next scheduled in this office 08/29/2023.  Robley Fries, PhD Marliss Czar, PhD LP Clinical Psychologist, [facility] [address] [place], [place] [address] [phone]

## 2023-07-10 NOTE — Progress Notes (Signed)
Admin note for non-service contact  Patient ID: [name]  MRN: [id number] DATE: 07/10/2023  Call made as scheduled, call not answered, LM identifying myself with suggestion to call back or I will call back in 10-15 min.  Review of EHR shows she did follow through as planned last week, calling in for Dr. Jennelle Human and resumption of psychiatry med service.  Return call 15 min later also not answered, also LM.  MyChart message sent as well.  Currently scheduled Dr Jennelle Human 12/18, myself 12/20.  Cardiology and vascular surgery f/u Friday next week.  Robley Fries, PhD Marliss Czar, PhD LP Clinical Psychologist, [facility] [address], [address] [place], [place] [address] [phone]

## 2023-07-16 ENCOUNTER — Emergency Department (HOSPITAL_COMMUNITY): Payer: Medicare Other

## 2023-07-16 ENCOUNTER — Telehealth: Payer: Self-pay | Admitting: Family Medicine

## 2023-07-16 ENCOUNTER — Inpatient Hospital Stay (HOSPITAL_COMMUNITY)
Admission: EM | Admit: 2023-07-16 | Discharge: 2023-07-23 | DRG: 271 | Disposition: A | Discharge status: Skilled Nursing Facility | Payer: Medicare Other | Attending: Internal Medicine | Admitting: Internal Medicine

## 2023-07-16 ENCOUNTER — Encounter (HOSPITAL_COMMUNITY): Payer: Self-pay | Admitting: Emergency Medicine

## 2023-07-16 ENCOUNTER — Other Ambulatory Visit: Payer: Self-pay

## 2023-07-16 DIAGNOSIS — Z7902 Long term (current) use of antithrombotics/antiplatelets: Secondary | ICD-10-CM

## 2023-07-16 DIAGNOSIS — Z803 Family history of malignant neoplasm of breast: Secondary | ICD-10-CM

## 2023-07-16 DIAGNOSIS — I714 Abdominal aortic aneurysm, without rupture, unspecified: Secondary | ICD-10-CM | POA: Diagnosis not present

## 2023-07-16 DIAGNOSIS — I70722 Atherosclerosis of other type of bypass graft(s) of the extremities with rest pain, left leg: Secondary | ICD-10-CM | POA: Diagnosis not present

## 2023-07-16 DIAGNOSIS — I10 Essential (primary) hypertension: Secondary | ICD-10-CM | POA: Diagnosis not present

## 2023-07-16 DIAGNOSIS — I739 Peripheral vascular disease, unspecified: Secondary | ICD-10-CM | POA: Diagnosis not present

## 2023-07-16 DIAGNOSIS — Z8249 Family history of ischemic heart disease and other diseases of the circulatory system: Secondary | ICD-10-CM

## 2023-07-16 DIAGNOSIS — Z7982 Long term (current) use of aspirin: Secondary | ICD-10-CM

## 2023-07-16 DIAGNOSIS — E871 Hypo-osmolality and hyponatremia: Secondary | ICD-10-CM | POA: Diagnosis present

## 2023-07-16 DIAGNOSIS — I2510 Atherosclerotic heart disease of native coronary artery without angina pectoris: Secondary | ICD-10-CM | POA: Diagnosis not present

## 2023-07-16 DIAGNOSIS — F319 Bipolar disorder, unspecified: Secondary | ICD-10-CM | POA: Diagnosis present

## 2023-07-16 DIAGNOSIS — I7143 Infrarenal abdominal aortic aneurysm, without rupture: Secondary | ICD-10-CM | POA: Diagnosis not present

## 2023-07-16 DIAGNOSIS — T82868A Thrombosis of vascular prosthetic devices, implants and grafts, initial encounter: Secondary | ICD-10-CM | POA: Diagnosis not present

## 2023-07-16 DIAGNOSIS — Z993 Dependence on wheelchair: Secondary | ICD-10-CM

## 2023-07-16 DIAGNOSIS — T82898A Other specified complication of vascular prosthetic devices, implants and grafts, initial encounter: Secondary | ICD-10-CM | POA: Diagnosis not present

## 2023-07-16 DIAGNOSIS — Z86011 Personal history of benign neoplasm of the brain: Secondary | ICD-10-CM

## 2023-07-16 DIAGNOSIS — J8410 Pulmonary fibrosis, unspecified: Secondary | ICD-10-CM | POA: Diagnosis not present

## 2023-07-16 DIAGNOSIS — I7121 Aneurysm of the ascending aorta, without rupture: Secondary | ICD-10-CM

## 2023-07-16 DIAGNOSIS — Z8673 Personal history of transient ischemic attack (TIA), and cerebral infarction without residual deficits: Secondary | ICD-10-CM

## 2023-07-16 DIAGNOSIS — L89151 Pressure ulcer of sacral region, stage 1: Secondary | ICD-10-CM | POA: Diagnosis not present

## 2023-07-16 DIAGNOSIS — Y831 Surgical operation with implant of artificial internal device as the cause of abnormal reaction of the patient, or of later complication, without mention of misadventure at the time of the procedure: Secondary | ICD-10-CM | POA: Diagnosis present

## 2023-07-16 DIAGNOSIS — I724 Aneurysm of artery of lower extremity: Secondary | ICD-10-CM | POA: Diagnosis not present

## 2023-07-16 DIAGNOSIS — Z9981 Dependence on supplemental oxygen: Secondary | ICD-10-CM | POA: Diagnosis not present

## 2023-07-16 DIAGNOSIS — K219 Gastro-esophageal reflux disease without esophagitis: Secondary | ICD-10-CM | POA: Diagnosis present

## 2023-07-16 DIAGNOSIS — Z961 Presence of intraocular lens: Secondary | ICD-10-CM | POA: Diagnosis not present

## 2023-07-16 DIAGNOSIS — F411 Generalized anxiety disorder: Secondary | ICD-10-CM | POA: Diagnosis present

## 2023-07-16 DIAGNOSIS — Z79899 Other long term (current) drug therapy: Secondary | ICD-10-CM | POA: Diagnosis not present

## 2023-07-16 DIAGNOSIS — I719 Aortic aneurysm of unspecified site, without rupture: Secondary | ICD-10-CM | POA: Diagnosis not present

## 2023-07-16 DIAGNOSIS — I998 Other disorder of circulatory system: Secondary | ICD-10-CM | POA: Diagnosis not present

## 2023-07-16 DIAGNOSIS — J432 Centrilobular emphysema: Secondary | ICD-10-CM | POA: Diagnosis not present

## 2023-07-16 DIAGNOSIS — I70228 Atherosclerosis of native arteries of extremities with rest pain, other extremity: Secondary | ICD-10-CM | POA: Diagnosis not present

## 2023-07-16 DIAGNOSIS — F329 Major depressive disorder, single episode, unspecified: Secondary | ICD-10-CM

## 2023-07-16 DIAGNOSIS — Z801 Family history of malignant neoplasm of trachea, bronchus and lung: Secondary | ICD-10-CM

## 2023-07-16 DIAGNOSIS — I70422 Atherosclerosis of autologous vein bypass graft(s) of the extremities with rest pain, left leg: Secondary | ICD-10-CM | POA: Diagnosis not present

## 2023-07-16 DIAGNOSIS — N281 Cyst of kidney, acquired: Secondary | ICD-10-CM | POA: Diagnosis not present

## 2023-07-16 DIAGNOSIS — R531 Weakness: Secondary | ICD-10-CM | POA: Diagnosis not present

## 2023-07-16 DIAGNOSIS — G2581 Restless legs syndrome: Secondary | ICD-10-CM | POA: Diagnosis not present

## 2023-07-16 DIAGNOSIS — R0600 Dyspnea, unspecified: Secondary | ICD-10-CM | POA: Diagnosis not present

## 2023-07-16 DIAGNOSIS — I743 Embolism and thrombosis of arteries of the lower extremities: Secondary | ICD-10-CM | POA: Diagnosis not present

## 2023-07-16 DIAGNOSIS — Z833 Family history of diabetes mellitus: Secondary | ICD-10-CM | POA: Diagnosis not present

## 2023-07-16 DIAGNOSIS — I679 Cerebrovascular disease, unspecified: Secondary | ICD-10-CM | POA: Diagnosis present

## 2023-07-16 DIAGNOSIS — Z888 Allergy status to other drugs, medicaments and biological substances status: Secondary | ICD-10-CM | POA: Diagnosis not present

## 2023-07-16 DIAGNOSIS — I70222 Atherosclerosis of native arteries of extremities with rest pain, left leg: Principal | ICD-10-CM | POA: Diagnosis present

## 2023-07-16 DIAGNOSIS — Z811 Family history of alcohol abuse and dependence: Secondary | ICD-10-CM

## 2023-07-16 DIAGNOSIS — I701 Atherosclerosis of renal artery: Secondary | ICD-10-CM | POA: Diagnosis not present

## 2023-07-16 DIAGNOSIS — I77811 Abdominal aortic ectasia: Secondary | ICD-10-CM | POA: Diagnosis not present

## 2023-07-16 DIAGNOSIS — Z9889 Other specified postprocedural states: Secondary | ICD-10-CM | POA: Diagnosis not present

## 2023-07-16 DIAGNOSIS — Z7401 Bed confinement status: Secondary | ICD-10-CM | POA: Diagnosis not present

## 2023-07-16 DIAGNOSIS — I129 Hypertensive chronic kidney disease with stage 1 through stage 4 chronic kidney disease, or unspecified chronic kidney disease: Secondary | ICD-10-CM | POA: Diagnosis not present

## 2023-07-16 DIAGNOSIS — R42 Dizziness and giddiness: Secondary | ICD-10-CM | POA: Diagnosis not present

## 2023-07-16 DIAGNOSIS — F17210 Nicotine dependence, cigarettes, uncomplicated: Secondary | ICD-10-CM | POA: Diagnosis present

## 2023-07-16 DIAGNOSIS — E039 Hypothyroidism, unspecified: Secondary | ICD-10-CM | POA: Diagnosis present

## 2023-07-16 DIAGNOSIS — I82412 Acute embolism and thrombosis of left femoral vein: Secondary | ICD-10-CM | POA: Diagnosis not present

## 2023-07-16 DIAGNOSIS — N183 Chronic kidney disease, stage 3 unspecified: Secondary | ICD-10-CM | POA: Diagnosis not present

## 2023-07-16 DIAGNOSIS — R5381 Other malaise: Secondary | ICD-10-CM | POA: Diagnosis present

## 2023-07-16 LAB — PROTIME-INR
INR: 1 (ref 0.8–1.2)
Prothrombin Time: 13.8 s (ref 11.4–15.2)

## 2023-07-16 LAB — BASIC METABOLIC PANEL
Anion gap: 16 — ABNORMAL HIGH (ref 5–15)
BUN: 17 mg/dL (ref 8–23)
CO2: 20 mmol/L — ABNORMAL LOW (ref 22–32)
Calcium: 9.5 mg/dL (ref 8.9–10.3)
Chloride: 98 mmol/L (ref 98–111)
Creatinine, Ser: 0.98 mg/dL (ref 0.44–1.00)
GFR, Estimated: 59 mL/min — ABNORMAL LOW (ref >60)
Glucose, Bld: 105 mg/dL — ABNORMAL HIGH (ref 70–99)
Potassium: 4.6 mmol/L (ref 3.5–5.1)
Sodium: 134 mmol/L — ABNORMAL LOW (ref 135–145)

## 2023-07-16 LAB — CBC
HCT: 38.7 % (ref 36.0–46.0)
Hemoglobin: 11.7 g/dL — ABNORMAL LOW (ref 12.0–15.0)
MCH: 27 pg (ref 26.0–34.0)
MCHC: 30.2 g/dL (ref 30.0–36.0)
MCV: 89.2 fL (ref 80.0–100.0)
Platelets: 324 10*3/uL (ref 150–400)
RBC: 4.34 MIL/uL (ref 3.87–5.11)
RDW: 14.3 % (ref 11.5–15.5)
WBC: 7.1 10*3/uL (ref 4.0–10.5)
nRBC: 0 % (ref 0.0–0.2)

## 2023-07-16 LAB — GLUCOSE, CAPILLARY: Glucose-Capillary: 111 mg/dL — ABNORMAL HIGH (ref 70–99)

## 2023-07-16 LAB — I-STAT CG4 LACTIC ACID, ED
Lactic Acid, Venous: 1.4 mmol/L (ref 0.5–1.9)
Lactic Acid, Venous: 0.9 mmol/L (ref 0.5–1.9)

## 2023-07-16 MED ORDER — OXYCODONE HCL 5 MG PO TABS
5.0000 mg | ORAL_TABLET | ORAL | Status: DC | PRN
  Administered 2023-07-17 – 2023-07-18 (×3): 5 mg via ORAL
  Filled 2023-07-16 (×4): qty 1

## 2023-07-16 MED ORDER — ONDANSETRON HCL 4 MG/2ML IJ SOLN
4.0000 mg | Freq: Four times a day (QID) | INTRAMUSCULAR | Status: DC | PRN
  Administered 2023-07-17: 4 mg via INTRAVENOUS

## 2023-07-16 MED ORDER — LOSARTAN POTASSIUM 25 MG PO TABS
25.0000 mg | ORAL_TABLET | Freq: Every day | ORAL | Status: DC
  Administered 2023-07-18 – 2023-07-22 (×4): 25 mg via ORAL
  Filled 2023-07-16 (×6): qty 1

## 2023-07-16 MED ORDER — FENTANYL CITRATE PF 50 MCG/ML IJ SOSY
12.5000 ug | PREFILLED_SYRINGE | INTRAMUSCULAR | Status: DC | PRN
  Administered 2023-07-17: 50 ug via INTRAVENOUS
  Filled 2023-07-16: qty 1

## 2023-07-16 MED ORDER — IOHEXOL 350 MG/ML SOLN
100.0000 mL | Freq: Once | INTRAVENOUS | Status: AC | PRN
  Administered 2023-07-16: 100 mL via INTRAVENOUS

## 2023-07-16 MED ORDER — ACETAMINOPHEN 650 MG RE SUPP: 650.0000 mg | Freq: Four times a day (QID) | RECTAL | Status: DC | PRN

## 2023-07-16 MED ORDER — SIMETHICONE 80 MG PO CHEW
80.0000 mg | CHEWABLE_TABLET | Freq: Four times a day (QID) | ORAL | Status: DC
  Administered 2023-07-16: 80 mg via ORAL
  Filled 2023-07-16 (×6): qty 1

## 2023-07-16 MED ORDER — ONDANSETRON HCL 4 MG PO TABS: 4.0000 mg | ORAL_TABLET | Freq: Four times a day (QID) | ORAL | Status: DC | PRN

## 2023-07-16 MED ORDER — ENOXAPARIN SODIUM 40 MG/0.4ML IJ SOSY
40.0000 mg | PREFILLED_SYRINGE | INTRAMUSCULAR | Status: DC
  Administered 2023-07-17: 40 mg via SUBCUTANEOUS
  Filled 2023-07-16 (×2): qty 0.4

## 2023-07-16 MED ORDER — AMLODIPINE BESYLATE 5 MG PO TABS
5.0000 mg | ORAL_TABLET | Freq: Every day | ORAL | Status: DC
  Administered 2023-07-17 – 2023-07-22 (×5): 5 mg via ORAL
  Filled 2023-07-16 (×6): qty 1

## 2023-07-16 MED ORDER — ALBUTEROL SULFATE (2.5 MG/3ML) 0.083% IN NEBU: 2.5000 mg | INHALATION_SOLUTION | Freq: Four times a day (QID) | RESPIRATORY_TRACT | Status: DC | PRN

## 2023-07-16 MED ORDER — ACETAMINOPHEN 325 MG PO TABS: 650.0000 mg | ORAL_TABLET | Freq: Four times a day (QID) | ORAL | Status: DC | PRN

## 2023-07-16 MED ORDER — ASPIRIN 81 MG PO TBEC
81.0000 mg | DELAYED_RELEASE_TABLET | Freq: Every day | ORAL | Status: DC
  Administered 2023-07-18 – 2023-07-22 (×5): 81 mg via ORAL
  Filled 2023-07-16 (×7): qty 1

## 2023-07-16 MED ORDER — POLYETHYLENE GLYCOL 3350 17 G PO PACK
34.0000 g | PACK | Freq: Every day | ORAL | Status: DC
Start: 2023-07-17 — End: 2023-07-20
  Administered 2023-07-19 – 2023-07-20 (×2): 34 g via ORAL
  Filled 2023-07-16 (×4): qty 2

## 2023-07-16 MED ORDER — DULOXETINE HCL 30 MG PO CPEP
30.0000 mg | ORAL_CAPSULE | Freq: Every day | ORAL | Status: DC
  Administered 2023-07-17 – 2023-07-22 (×5): 30 mg via ORAL
  Filled 2023-07-16 (×8): qty 1

## 2023-07-16 MED ORDER — ROPINIROLE HCL 1 MG PO TABS
2.0000 mg | ORAL_TABLET | Freq: Every day | ORAL | Status: DC
  Administered 2023-07-16 – 2023-07-22 (×7): 2 mg via ORAL
  Filled 2023-07-16 (×9): qty 2

## 2023-07-16 NOTE — ED Triage Notes (Signed)
Pt BIB [facility] from home with reports of increased weakness, dizziness, and SOB. Pt reports "my toes are black, and my fingers turn purple when they are cold."

## 2023-07-16 NOTE — ED Notes (Signed)
ED TO INPATIENT HANDOFF REPORT  ED Nurse Name and Phone #: Darral Dash RN [phone]  S Name/Age/Gender [name] 77 y.o. female Room/Bed: H009C/H009C  Code Status   Code Status: Full Code  Home/SNF/Other Home Patient oriented to: self, place, time, and situation Is this baseline? 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 [facility]) [[id number]]  Admitting Physician: Briscoe Deutscher [[id number]]  Attending Physician: Briscoe Deutscher [[id number]]  Certification:: I certify this patient will need inpatient services for at least 2 midnights  Expected Medical Readiness: 07/18/2023          B Medical/Surgery History Past Medical History:  Diagnosis Date   AAA (abdominal aortic aneurysm) (HCC)    3.1 cm 07/08/18, 3 year follow-up recommended; possible 3 cm AAA by aortogram 09/13/18   Anemia    PMH   Appendicitis with abscess    07/08/18, s/p perc drain; resolved 07/30/18 by CT   Arthritis    Cerebrovascular disease    intra and extracranial vascular dx per MRI 4/11, neurology rec strict CVRF control   Colonic inertia    Constipation    chronic;severe   Depression    Duodenitis    Gastritis    GERD (gastroesophageal reflux disease)    Hypertension    Hypothyroid 01/16/2014   Meningioma (HCC)  10/21/2013   PAD (peripheral artery disease) (HCC)    Psoriasis    sees derm   Wears dentures    Wears glasses    Past Surgical History:  Procedure Laterality Date   ABDOMINAL AORTOGRAM W/LOWER EXTREMITY N/A 09/13/2018   Procedure: ABDOMINAL AORTOGRAM W/LOWER EXTREMITY;  Surgeon: Sherren Kerns, MD;  Location: [facility] INVASIVE [facility] LAB;  Service: Cardiovascular;  Laterality: N/A;   arthroscopy  04/2010   Right knee   CATARACT EXTRACTION W/ INTRAOCULAR LENS  IMPLANT, BILATERAL     COLONOSCOPY     FEMORAL-POPLITEAL BYPASS GRAFT  10/22/2018   FEMORAL-POPLITEAL BYPASS GRAFT Left 10/22/2018   Procedure: LEFT FEMORAL TO BELOW THE KNEE POPLITEAL ARTERY BYPASS GRAFT;  Surgeon: Sherren Kerns, MD;  Location: [facility] OR;  Service: Vascular;  Laterality: Left;   I & D EXTREMITY Left 11/08/2018   Procedure: IRRIGATION AND DEBRIDEMENT EXTREMITY Left Leg;  Surgeon: Sherren Kerns, MD;  Location: [facility] OR;  Service: Vascular;  Laterality: Left;   IR RADIOLOGIST EVAL & MGMT  07/30/2018   MULTIPLE TOOTH EXTRACTIONS     TUBAL LIGATION       A IV Location/Drains/Wounds Patient Lines/Drains/Airways Status     Active Line/Drains/Airways     Name Placement date Placement time Site Days   Peripheral IV 07/16/23 20 G 1.88" Left Antecubital 07/16/23  1540  Antecubital  less than 1  Intake/Output Last 24 hours No intake or output data in the 24 hours ending 07/16/23 2116  Labs/Imaging Results for orders placed or performed during the hospital encounter of 07/16/23 (from the past 48 hour(s))  Basic metabolic panel     Status: Abnormal   Collection Time: 07/16/23 12:54 PM  Result Value Ref Range   Sodium 134 (L) 135 - 145 mmol/L   Potassium 4.6 3.5 - 5.1 mmol/L   Chloride 98 98 - 111 mmol/L   CO2 20 (L) 22 - 32 mmol/L   Glucose, Bld 105 (H) 70 - 99 mg/dL    Comment: Glucose reference range applies only to samples taken after fasting for at least 8 hours.   BUN 17 8 - 23 mg/dL   Creatinine,  Ser 1.61 0.44 - 1.00 mg/dL   Calcium 9.5 8.9 - 09.6 mg/dL   GFR, Estimated 59 (L) >60 mL/min    Comment: (NOTE) Calculated using the CKD-EPI Creatinine Equation (2021)    Anion gap 16 (H) 5 - 15    Comment: Performed at [facility], [address], [place], [place] [address]  I-Stat CG4 Lactic Acid     Status: None   Collection Time: 07/16/23  1:38 PM  Result Value Ref Range   Lactic Acid, Venous 1.4 0.5 - 1.9 mmol/L  CBC     Status: Abnormal   Collection Time: 07/16/23  2:33 PM  Result Value Ref Range   WBC 7.1 4.0 - 10.5 K/uL   RBC 4.34 3.87 - 5.11 MIL/uL   Hemoglobin 11.7 (L) 12.0 - 15.0 g/dL   HCT 98.1 19.1 - 47.8 %   MCV 89.2 80.0 - 100.0 fL   MCH 27.0 26.0 - 34.0 pg   MCHC 30.2 30.0 - 36.0 g/dL   RDW 29.5 62.1 - 30.8 %   Platelets 324 150 - 400 K/uL   nRBC 0.0 0.0 - 0.2 %    Comment: Performed at [facility], [address], [place], [place] [address]  Protime-INR     Status: None   Collection Time: 07/16/23  2:33 PM  Result Value Ref Range   Prothrombin Time 13.8 11.4 - 15.2 seconds   INR 1.0 0.8 - 1.2    Comment: (NOTE) INR goal varies based on device and disease states. Performed at [facility], [address], [place], [place] [address]   I-Stat CG4 Lactic Acid     Status: None   Collection Time: 07/16/23  2:55 PM  Result Value Ref Range   Lactic Acid, Venous 0.9 0.5 - 1.9 mmol/L   CT Angio Chest Aorta W and/or Wo Contrast  Result Date: 07/16/2023 CLINICAL DATA:  Peripheral ischemia, dyspnea, dizziness, weakness EXAM: CT ANGIOGRAPHY CHEST WITH CONTRAST TECHNIQUE: Multidetector CT imaging of the chest was performed using the standard protocol during bolus administration of intravenous contrast. Multiplanar CT image reconstructions and MIPs were obtained to evaluate the vascular anatomy. RADIATION DOSE REDUCTION: This exam was performed according to the departmental dose-optimization program which includes automated exposure control, adjustment  of the mA and/or kV according to patient size and/or use of iterative reconstruction technique. CONTRAST:  OMNIPAQUE IOHEXOL 350 MG/ML SOLN, OMNIPAQUE IOHEXOL 350 MG/ML SOLN COMPARISON:  02/13/2023 FINDINGS: Cardiovascular: There is adequate opacification of the pulmonary arterial tree. No intraluminal filling defect identified to suggest acute pulmonary embolism. The central pulmonary arteries are of normal caliber. Mild coronary artery calcification. Global cardiac size is within normal limits. No pericardial effusion.  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 Superimposed moderate atherosclerotic calcification. Ascending aorta is of normal caliber. No superimposed intramural hematoma or dissection. Arch vasculature is widely patent proximally. Mediastinum/Nodes: No enlarged mediastinal, hilar, or axillary lymph nodes. Thyroid gland, trachea, and esophagus demonstrate no significant findings. Lungs/Pleura: Moderate emphysema. Bibasilar pulmonary fibrotic change again noted. Right middle lobe pulmonary nodule, axial image # 69/12, stable since remote prior examinations of 11/17/2019 and safely considered benign. No confluent pulmonary infiltrate. No pneumothorax or pleural effusion. No central obstructing lesion. Upper Abdomen: No acute abnormality. Musculoskeletal: Osseous structures are age-appropriate. No acute bone abnormality. No lytic or blastic bone lesion. Review of the MIP images confirms the above findings. IMPRESSION: 1. No pulmonary embolism. No acute intrathoracic pathology identified. 2. Mild coronary artery calcification. 3. 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 Recommend annual imaging followup by CTA or MRA. This recommendation follows 2010 ACCF/AHA/AATS/ACR/ASA/SCA/SCAI/SIR/STS/SVM Guidelines for the  Diagnosis and Management of Patients with Thoracic Aortic Disease. Circulation.2010; 121: W098-J191. Aortic aneurysm NOS (ICD10-I71.9) 4. Moderate emphysema. 5. Stable bibasilar pulmonary fibrotic change. Aortic Atherosclerosis (ICD10-I70.0) and Emphysema (ICD10-J43.9). Electronically Signed   By: Helyn Numbers M.D.   On: 07/16/2023 19:59   CT ANGIO AO+BIFEM W & OR WO CONTRAST  Result Date: 07/16/2023 CLINICAL DATA:  Claudication or leg ischemia EXAM: CT ANGIOGRAPHY OF ABDOMINAL AORTA WITH ILIOFEMORAL RUNOFF TECHNIQUE: Multidetector CT imaging of the abdomen, pelvis and lower extremities was performed using the standard protocol during bolus administration of intravenous contrast. Multiplanar CT image reconstructions and MIPs were obtained to evaluate the vascular anatomy. RADIATION DOSE REDUCTION: This exam was performed according to the departmental dose-optimization program which includes automated exposure control, adjustment of the mA and/or kV according to patient size and/or use of iterative reconstruction technique. CONTRAST:  OMNIPAQUE IOHEXOL 350 MG/ML SOLN, OMNIPAQUE IOHEXOL 350 MG/ML SOLN COMPARISON:  09/26/2019 FINDINGS: VASCULAR Aorta: 3.2 x 3.7 cm fusiform infrarenal abdominal aortic aneurysm. Moderate superimposed atherosclerotic calcification. No periaortic inflammatory change or fluid collection identified. Celiac: Variant anatomy with common origin of the celiac axis and superior mesenteric artery. No evidence of hemodynamically significant stenosis. No aneurysm or dissection. SMA: See above Renals: Single right and dual left renal arteries. 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 No evidence of hemodynamically significant stenosis involving the left renal arteries. Normal vascular morphology. No aneurysm or dissection. IMA: Patent without evidence of aneurysm, dissection, vasculitis or significant stenosis. RIGHT Lower Extremity Inflow: Extensive  mixed atherosclerotic plaque within the right lower extremity arterial inflow without evidence of hemodynamically significant stenosis. Internal iliac artery is widely patent. No aneurysm or dissection. Outflow: Diffuse scattered moderate atherosclerotic calcification within the SFA without evidence of hemodynamically significant stenosis. Common femoral artery and profundus femoral arteries are widely patent. Runoff: Short segment dissection of the right P1 popliteal artery. 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 Distal popliteal artery and tibioperoneal trunk are widely patent. Anterior tibial artery occludes shortly beyond its takeoff. Peroneal artery and posterior tibial artery are patent to the level of the ankle with patency of the plantar arch. Dorsalis pedis artery is occluded. LEFT Lower Extremity Inflow: Scattered atherosclerotic calcification without evidence of hemodynamically significant stenosis involving the left lower extremity arterial inflow. Internal iliac artery is patent at its origin. Outflow: Surgical changes of right superficial femoral arterial-popliteal artery bypass  grafting has been performed. The superficial femoral artery proximally as well as the bypass graft are thrombosed. The native superficial femoral artery is thrombosed. The P1 and P2 segments of the popliteal artery are thrombosed. The profundus femoral artery is patent. There is reconstitution of the P3 segment of the popliteal artery via geniculate collaterals. Runoff: See above. Anterior tibial artery is occluded shortly beyond its origin. Peroneal and posterior tibial arteries continue to the ankle with patency of the plantar arch. Dorsalis pedis artery is diminutive but patent. Veins: Surgical changes of left saphenous vein harvesting are noted. The venous structures are otherwise unremarkable on this arterial phase examination. Review of the MIP images confirms the above findings. NON-VASCULAR Lower chest:  Bibasilar pulmonary fibrotic change noted. No acute abnormality. Hepatobiliary: No focal liver abnormality is seen. No gallstones, gallbladder wall thickening, or biliary dilatation. Pancreas: Unremarkable Spleen: Unremarkable Adrenals/Urinary Tract: The adrenal glands are unremarkable. The kidneys are normal in size and position. Simple cortical cyst noted within the lower pole the left kidney for which no follow-up imaging is recommended. The kidneys are otherwise unremarkable. Bladder is unremarkable. Stomach/Bowel: Stomach is within normal limits. Appendix appears normal. No evidence of bowel wall thickening, distention, or inflammatory changes. Lymphatic: No pathologic adenopathy within the abdomen and pelvis. Reproductive: Uterus and bilateral adnexa are unremarkable. Other: No abdominal wall hernia Musculoskeletal: No acute or significant osseous findings. IMPRESSION: 1. 3.2 x 3.7 cm fusiform infrarenal abdominal aortic aneurysm. Recommend follow-up ultrasound every 2 years. 2. 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 3. Short segment dissection of the right P1 popliteal artery. 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 4. Left lower extremity arterial outflow disease with surgical changes of left superficial femoral arterial-popliteal artery bypass grafting. The superficial femoral artery proximally as well as the bypass graft are thrombosed. The native superficial femoral artery is thrombosed. The P1 and P2 segments of the popliteal artery are thrombosed. There is reconstitution of the P3 segment of the popliteal artery via geniculate collaterals. 5. Two-vessel runoff bilaterally. Aortic Atherosclerosis (ICD10-I70.0). Electronically Signed   By: Helyn Numbers M.D.   On: 07/16/2023 19:54    Pending Labs Unresulted Labs (From admission, onward)     Start     Ordered   07/23/23 0500  Creatinine, serum  (enoxaparin (LOVENOX)    CrCl >/= 30 ml/min)  Weekly,   R      Comments: while on enoxaparin therapy    07/16/23 2106   07/17/23 0500  Basic metabolic panel  Daily,   R      07/16/23 2106   07/17/23 0500  CBC  Daily,   R      07/16/23 2106            Vitals/Pain Today's Vitals   07/16/23 1223 07/16/23 1239 07/16/23 1650 07/16/23 2110  BP: (!) 165/82  (!) 166/88   Pulse: 85  94   Resp: 20  18   Temp: 97.7 F (36.5 C)  98.1 F (36.7 C) 98.2 F (36.8 C)  TempSrc: Oral  Oral Oral  SpO2: 100%  98%   PainSc:  0-No pain      Isolation Precautions No active isolations  Medications Medications  aspirin EC tablet 81 mg (has no administration in time range)  amLODipine (NORVASC) tablet 5 mg (has no administration in time range)  losartan (COZAAR) tablet 25 mg (has no administration in time range)  DULoxetine (CYMBALTA) DR capsule 30 mg (has no administration in time  range)  polyethylene glycol (MIRALAX / GLYCOLAX) packet 34 g (has no administration in time range)  simethicone (MYLICON) chewable tablet 80 mg (has no administration in time range)  rOPINIRole (REQUIP) tablet 2 mg (has no administration in time range)  albuterol (VENTOLIN HFA) 108 (90 Base) MCG/ACT inhaler 1-2 puff (has no administration in time range)  enoxaparin (LOVENOX) injection 40 mg (has no administration in time range)  acetaminophen (TYLENOL) tablet 650 mg (has no administration in time range)    Or  acetaminophen (TYLENOL) suppository 650 mg (has no administration in time range)  oxyCODONE (Oxy IR/ROXICODONE) immediate release tablet 5 mg (has no administration in time range)  fentaNYL (SUBLIMAZE) injection 12.5-50 mcg (has no administration in time range)  ondansetron (ZOFRAN) tablet 4 mg (has no administration in time range)    Or  ondansetron (ZOFRAN) injection 4 mg (has no administration in time range)  iohexol (OMNIPAQUE) 350 MG/ML injection 100 mL (100 mLs Intravenous Contrast Given 07/16/23 1735)  iohexol (OMNIPAQUE) 350 MG/ML injection 100 mL (100 mLs  Intravenous Contrast Given 07/16/23 1736)    Mobility walks with device     Focused Assessments     R Recommendations: See Admitting Provider Note  Report given to:   Additional Notes: PT A/Ox4, pt usually uses walker or wheelchair to get around the room, she usually has a home health nurse that comes to visit. Pt is very pleasant, I have asked her multiple times if she would like to go to the bathroom, she stated she would like to wait until she got up to her own room with own bathroom.

## 2023-07-16 NOTE — Significant Event (Signed)
Rr while on comode. Loc, brief  Fsbs 11 Ox 97% RA  172/61, 77 A&)x3  W/o pain  Likely vasovagal  Tele added

## 2023-07-16 NOTE — ED Provider Notes (Signed)
[place] EMERGENCY DEPARTMENT AT [facility] Provider Note   CSN: [id number] Arrival date & time: 07/16/23  1222     History  Chief Complaint  Patient presents with   Weakness    [name] is a 77 y.o. female.  HPI    77 year old female comes in with chief complaint of skin discoloration in both her hands and feet.  Her left lower extremity is more severe.  She is also having some leg discomfort in the left side.  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  Patient states that she started noticing change to her hand and feet skin color about a month or 2 ago.  Over time the symptoms/discoloration has worsened.  She is also having some pain in her leg, particularly the left side.  Patient smokes about half a pack a day.  She is currently not seeing vascular surgery.  Home Medications Prior to Admission medications   Medication Sig Start Date End Date Taking? Authorizing Provider  albuterol (VENTOLIN HFA) 108 (90 Base) MCG/ACT inhaler Inhale 1-2 puffs into the lungs every 6 (six) hours as needed for wheezing or shortness of breath. 05/31/23  Yes Shade Flood, MD  amLODipine (NORVASC) 5 MG tablet Take 1 tablet (5 mg total) by mouth daily. 02/07/23  Yes Rollene Rotunda, MD  aspirin EC 81 MG tablet Take 81 mg by mouth daily. Swallow whole.   Yes [provider]  DULoxetine (CYMBALTA) 30 MG capsule Take 1 capsule (30 mg total) by mouth daily. 07/04/23  Yes Cottle, Steva Ready., MD  losartan (COZAAR) 25 MG tablet Take 1 tablet (25 mg total) by mouth daily. 02/23/23 02/23/24 Yes Hochrein, Fayrene Fearing, MD  polyethylene glycol (MIRALAX / GLYCOLAX) 17 g packet Take 34 g by mouth daily. 03/08/23  Yes Rolly Salter, MD  PRESCRIPTION MEDICATION Take 1 capsule by mouth daily. Prokinetic. Unknown name and strength.   Yes [provider]  rOPINIRole (REQUIP) 2 MG tablet TAKE 1  TABLET(2 MG) BY MOUTH AT BEDTIME Patient taking differently: Take 2 mg by mouth at bedtime. 03/30/23  Yes Cottle, Steva Ready., MD  simethicone (MYLICON) 80 MG chewable tablet Chew 1 tablet (80 mg total) by mouth 4 (four) times daily. 03/07/23  Yes Rolly Salter, MD      Allergies    Patient has no known allergies.    Review of Systems   Review of Systems  All other systems reviewed and are negative.   Physical Exam Updated Vital Signs BP (!) 166/88 (BP Location: Left Arm)   Pulse 94   Temp 98.1 F (36.7 C) (Oral)   Resp 18   SpO2 98%  Physical Exam Vitals and nursing note reviewed.  Constitutional:      Appearance: She is well-developed.  HENT:     Head: Atraumatic.  Cardiovascular:     Rate and Rhythm: Normal rate.  Pulmonary:     Effort: Pulmonary effort is normal.  Musculoskeletal:     Cervical back: Normal range of motion and neck supple.  Skin:    General: Skin is warm and dry.     Comments: Bilateral upper and lower extremity skin hyperpigmentation noted.  Patient has significant hyperpigmentation/darkening of the skin over the plantar aspect of the toes and the distal volar digits.  Patient has 2+ radial pulse bilaterally in the upper extremity.  Her skin is still warm to touch in  both upper and lower extremity but her pulse is not easily noted in the DP.  Neurological:     Mental Status: She is alert and oriented to person, place, and time.     ED Results / Procedures / Treatments   Labs (all labs ordered are listed, but only abnormal results are displayed) Labs Reviewed  BASIC METABOLIC PANEL - Abnormal; Notable for the following components:      Result Value   Sodium 134 (*)    CO2 20 (*)    Glucose, Bld 105 (*)    GFR, Estimated 59 (*)    Anion gap 16 (*)    All other components within normal limits  CBC - Abnormal; Notable for the following components:   Hemoglobin 11.7 (*)    All other components within normal limits  PROTIME-INR  I-STAT CG4  LACTIC ACID, ED  I-STAT CG4 LACTIC ACID, ED    EKG EKG Interpretation Date/Time:  Monday July 16 2023 12:33:25 EST Ventricular Rate:  86 PR Interval:  154 QRS Duration:  88 QT Interval:  340 QTC Calculation: 406 R Axis:   -48  Text Interpretation: Normal sinus rhythm Biatrial enlargement Left axis deviation Left ventricular hypertrophy with repolarization abnormality ( Cornell product ) Cannot rule out Septal infarct , age undetermined Abnormal ECG No acute changes No significant change since last tracing Confirmed by Derwood Kaplan [phone]) on 07/16/2023 12:50:39 PM  Radiology No results found.  Procedures Procedures    Medications Ordered in ED Medications - No data to display  ED Course/ Medical Decision Making/ A&P                                 Medical Decision Making Amount and/or Complexity of Data Reviewed Labs: ordered. Radiology: ordered.   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    The differential diagnosis includes : Buerger's disease, critical limb ischemia, aortic thrombosis, Raynaud's syndrome.  The initial plan is to get basic labs, consult vascular surgery and get CT angiogram if they think it would be helpful.   Additional history obtained: Records reviewed previous admission documents  Independent labs interpretation:  The following labs were independently interpreted: Initial blood work is normal.  Consultation: - Consulted or discussed management/test interpretation with external professional: Dr. Chestine Spore, vascular surgery. They will see the patient after the CT scan and give [place] disposition.  Social  Determinants of health: Patient is wheelchair-bound.  She indicated that if she is discharged, she will be unable to get home today.  She will likely be picked up by family members tomorrow morning.  Final Clinical Impression(s) / ED Diagnoses Final diagnoses:  Critical limb ischemia of left lower extremity [facility])    Rx / DC Orders ED Discharge Orders     None         Derwood Kaplan, MD 07/16/23 1657

## 2023-07-16 NOTE — Telephone Encounter (Signed)
Son called back and said he called 911 for his mother

## 2023-07-16 NOTE — H&P (Signed)
History and Physical    [name] ZDG:[id number] DOB: 01/01/1946 DOA: 07/16/2023  PCP: Shade Flood, MD   Patient coming from: Home   Chief Complaint: Distal extremity discoloration and pain   HPI: [name] is a 77 y.o. female with medical history significant for hypertension, depression, anxiety, emphysema, pulmonary fibrosis, history of CVA, and PAD status post left femoral to below the knee popliteal artery bypass grafting in February 2020 who now presents with pain and discoloration of the distal extremities.  Patient reports worsening lower extremity pain over the past 1 to 2 months, particularly in the left foot.  She has also noted numbness developing in her left foot. She reports improvement in pain when her foot is kept in a dependent position.  She denies fevers, chills, or active chest pain.  ED Course: Upon arrival to the ED, patient is found to be afebrile and saturating well on room air with normal heart rate and stable blood pressure.  Imaging demonstrates thrombosis with occlusion of her native left SFA and left SFA to popliteal bypass.  Vascular surgery evaluated the patient in the ED and recommends admission with plan for angiogram tomorrow.  Review of Systems:  All other systems reviewed and apart from HPI, are negative.  Past Medical History:  Diagnosis Date   AAA (abdominal aortic aneurysm) (HCC)    3.1 cm 07/08/18, 3 year follow-up recommended; possible 3 cm AAA by aortogram 09/13/18   Anemia    PMH   Appendicitis with abscess    07/08/18, s/p perc drain; resolved 07/30/18 by CT   Arthritis    Cerebrovascular disease    intra and extracranial vascular dx per MRI 4/11, neurology rec strict CVRF control   Colonic inertia    Constipation    chronic;severe   Depression    Duodenitis    Gastritis    GERD (gastroesophageal reflux disease)    Hypertension    Hypothyroid 01/16/2014   Meningioma (HCC) 10/21/2013   PAD (peripheral artery disease)  (HCC)    Psoriasis    sees derm   Wears dentures    Wears glasses     Past Surgical History:  Procedure Laterality Date   ABDOMINAL AORTOGRAM W/LOWER EXTREMITY N/A 09/13/2018   Procedure: ABDOMINAL AORTOGRAM W/LOWER EXTREMITY;  Surgeon: Sherren Kerns, MD;  Location: [facility];  Service: Cardiovascular;  Laterality: N/A;   arthroscopy  04/2010   Right knee   CATARACT EXTRACTION W/ INTRAOCULAR LENS  IMPLANT, BILATERAL     COLONOSCOPY     FEMORAL-POPLITEAL BYPASS GRAFT  10/22/2018   FEMORAL-POPLITEAL BYPASS GRAFT Left 10/22/2018   Procedure: LEFT FEMORAL TO BELOW THE KNEE POPLITEAL ARTERY BYPASS GRAFT;  Surgeon: Sherren Kerns, MD;  Location: [facility] OR;  Service: Vascular;  Laterality: Left;   I & D EXTREMITY Left 11/08/2018   Procedure: IRRIGATION AND DEBRIDEMENT EXTREMITY Left Leg;  Surgeon: Sherren Kerns, MD;  Location: [facility] OR;  Service: Vascular;  Laterality: Left;   IR RADIOLOGIST EVAL & MGMT  07/30/2018   MULTIPLE TOOTH EXTRACTIONS     TUBAL LIGATION      Social History:   reports that she has been smoking cigarettes. She started smoking about 63 years ago. She has a 30 pack-year smoking history. She has never used smokeless tobacco. She reports current alcohol use. She reports that she does not use drugs.  No Known Allergies  Family History  Problem Relation Age of Onset   Breast cancer Mother  metastisis to bones   Cancer Sister 35       spinal   Alcohol abuse Brother    Heart disease Brother    Lung cancer Brother        smoked   Diabetes Son    Heart disease Maternal Aunt    Heart disease Other        grandfather    Colon cancer Neg Hx    Esophageal cancer Neg Hx    Stomach cancer Neg Hx      Prior to Admission medications   Medication Sig Start Date End Date Taking? Authorizing Provider  albuterol (VENTOLIN HFA) 108 (90 Base) MCG/ACT inhaler Inhale 1-2 puffs into the lungs every 6 (six) hours as needed for wheezing or shortness of breath.  05/31/23  Yes Shade Flood, MD  amLODipine (NORVASC) 5 MG tablet Take 1 tablet (5 mg total) by mouth daily. 02/07/23  Yes Rollene Rotunda, MD  aspirin EC 81 MG tablet Take 81 mg by mouth daily. Swallow whole.   Yes [provider]  DULoxetine (CYMBALTA) 30 MG capsule Take 1 capsule (30 mg total) by mouth daily. 07/04/23  Yes Cottle, Steva Ready., MD  losartan (COZAAR) 25 MG tablet Take 1 tablet (25 mg total) by mouth daily. 02/23/23 02/23/24 Yes Hochrein, Fayrene Fearing, MD  polyethylene glycol (MIRALAX / GLYCOLAX) 17 g packet Take 34 g by mouth daily. 03/08/23  Yes Rolly Salter, MD  PRESCRIPTION MEDICATION Take 1 capsule by mouth daily. Prokinetic. Unknown name and strength.   Yes [provider]  rOPINIRole (REQUIP) 2 MG tablet TAKE 1 TABLET(2 MG) BY MOUTH AT BEDTIME Patient taking differently: Take 2 mg by mouth at bedtime. 03/30/23  Yes Cottle, Steva Ready., MD  simethicone (MYLICON) 80 MG chewable tablet Chew 1 tablet (80 mg total) by mouth 4 (four) times daily. 03/07/23  Yes Rolly Salter, MD    Physical Exam: Vitals:   07/16/23 1223 07/16/23 1650  BP: (!) 165/82 (!) 166/88  Pulse: 85 94  Resp: 20 18  Temp: 97.7 F (36.5 C) 98.1 F (36.7 C)  TempSrc: Oral Oral  SpO2: 100% 98%    Constitutional: NAD, calm  Eyes: PERTLA, lids and conjunctivae normal ENMT: Mucous membranes are moist. Posterior pharynx clear of any exudate or lesions.   Neck: supple, no masses  Respiratory: no wheezing, no crackles. No accessory muscle use.  Cardiovascular: S1 & S2 heard, regular rate and rhythm. No JVD. Abdomen: No distension, no tenderness, soft. Bowel sounds active.  Musculoskeletal: no clubbing / cyanosis. No joint deformity upper and lower extremities.   Skin: Left foot cool. Skin otherwise warm, dry, well-perfused. Neurologic: CN 2-12 grossly intact. Moving all extremities. Alert and oriented.  Psychiatric: Pleasant. Cooperative.    Labs and Imaging on Admission: I have  personally reviewed following labs and imaging studies  CBC: Recent Labs  Lab 07/16/23 1433  WBC 7.1  HGB 11.7*  HCT 38.7  MCV 89.2  PLT 324   Basic Metabolic Panel: Recent Labs  Lab 07/16/23 1254  NA 134*  K 4.6  CL 98  CO2 20*  GLUCOSE 105*  BUN 17  CREATININE 0.98  CALCIUM 9.5   GFR: CrCl cannot be calculated (Unknown ideal weight.). Liver Function Tests: No results for input(s): "AST", "ALT", "ALKPHOS", "BILITOT", "PROT", "ALBUMIN" in the last 168 hours. No results for input(s): "LIPASE", "AMYLASE" in the last 168 hours. No results for input(s): "AMMONIA" in the last 168 hours. Coagulation Profile: Recent  Labs  Lab 07/16/23 1433  INR 1.0   Cardiac Enzymes: No results for input(s): "CKTOTAL", "CKMB", "CKMBINDEX", "TROPONINI" in the last 168 hours. BNP (last 3 results) No results for input(s): "PROBNP" in the last 8760 hours. HbA1C: No results for input(s): "HGBA1C" in the last 72 hours. CBG: No results for input(s): "GLUCAP" in the last 168 hours. Lipid Profile: No results for input(s): "CHOL", "HDL", "LDLCALC", "TRIG", "CHOLHDL", "LDLDIRECT" in the last 72 hours. Thyroid Function Tests: No results for input(s): "TSH", "T4TOTAL", "FREET4", "T3FREE", "THYROIDAB" in the last 72 hours. Anemia Panel: No results for input(s): "VITAMINB12", "FOLATE", "FERRITIN", "TIBC", "IRON", "RETICCTPCT" in the last 72 hours. Urine analysis:    Component Value Date/Time   COLORURINE STRAW (A) 03/03/2023 0543   APPEARANCEUR CLEAR 03/03/2023 0543   LABSPEC 1.013 03/03/2023 0543   PHURINE 7.0 03/03/2023 0543   GLUCOSEU NEGATIVE 03/03/2023 0543   HGBUR SMALL (A) 03/03/2023 0543   BILIRUBINUR NEGATIVE 03/03/2023 0543   BILIRUBINUR negative 11/10/2019 1432   BILIRUBINUR neg 07/31/2013 1752   KETONESUR NEGATIVE 03/03/2023 0543   PROTEINUR NEGATIVE 03/03/2023 0543   UROBILINOGEN 0.2 11/10/2019 1432   NITRITE NEGATIVE 03/03/2023 0543   LEUKOCYTESUR NEGATIVE 03/03/2023 0543    Sepsis Labs: @LABRCNTIP (procalcitonin:4,lacticidven:4) )No results found for this or any previous visit (from the past 240 hour(s)).   Radiological Exams on Admission: CT Angio Chest Aorta W and/or Wo Contrast  Result Date: 07/16/2023 CLINICAL DATA:  Peripheral ischemia, dyspnea, dizziness, weakness EXAM: CT ANGIOGRAPHY CHEST WITH CONTRAST TECHNIQUE: Multidetector CT imaging of the chest was performed using the standard protocol during bolus administration of intravenous contrast. Multiplanar CT image reconstructions and MIPs were obtained to evaluate the vascular anatomy. RADIATION DOSE REDUCTION: This exam was performed according to the departmental dose-optimization program which includes automated exposure control, adjustment of the mA and/or kV according to patient size and/or use of iterative reconstruction technique. CONTRAST:  OMNIPAQUE IOHEXOL 350 MG/ML SOLN, OMNIPAQUE IOHEXOL 350 MG/ML SOLN COMPARISON:  02/13/2023 FINDINGS: Cardiovascular: There is adequate opacification of the pulmonary arterial tree. No intraluminal filling defect identified to suggest acute pulmonary embolism. The central pulmonary arteries are of normal caliber. Mild coronary artery calcification. Global cardiac size is within normal limits. No pericardial effusion. 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 Superimposed moderate atherosclerotic calcification. Ascending aorta is of normal caliber. No superimposed intramural hematoma or dissection. Arch vasculature is widely patent proximally. Mediastinum/Nodes: No enlarged mediastinal, hilar, or axillary lymph nodes. Thyroid gland, trachea, and esophagus demonstrate no significant findings. Lungs/Pleura: Moderate emphysema. Bibasilar pulmonary fibrotic change again noted. Right middle lobe pulmonary nodule, axial image # 69/12,  stable since remote prior examinations of 11/17/2019 and safely considered benign. No confluent pulmonary infiltrate. No pneumothorax or pleural effusion. No central obstructing lesion. Upper Abdomen: No acute abnormality. Musculoskeletal: Osseous structures are age-appropriate. No acute bone abnormality. No lytic or blastic bone lesion. Review of the MIP images confirms the above findings. IMPRESSION: 1. No pulmonary embolism. No acute intrathoracic pathology identified. 2. Mild coronary artery calcification. 3. 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 Recommend annual imaging followup by CTA or MRA. This recommendation follows 2010 ACCF/AHA/AATS/ACR/ASA/SCA/SCAI/SIR/STS/SVM Guidelines for the Diagnosis and Management of Patients with Thoracic Aortic Disease. Circulation.2010; 121: Z610-R604. Aortic aneurysm NOS (ICD10-I71.9) 4. Moderate emphysema. 5. Stable bibasilar pulmonary fibrotic change. Aortic Atherosclerosis (ICD10-I70.0) and Emphysema (ICD10-J43.9).  Electronically Signed   By: Helyn Numbers M.D.   On: 07/16/2023 19:59   CT ANGIO AO+BIFEM W & OR WO CONTRAST  Result Date: 07/16/2023 CLINICAL DATA:  Claudication or leg ischemia EXAM: CT ANGIOGRAPHY OF ABDOMINAL AORTA WITH ILIOFEMORAL RUNOFF TECHNIQUE: Multidetector CT imaging of the abdomen, pelvis and lower extremities was performed using the standard protocol during bolus administration of intravenous contrast. Multiplanar CT image reconstructions and MIPs were obtained to evaluate the vascular anatomy. RADIATION DOSE REDUCTION: This exam was performed according to the departmental dose-optimization program which includes automated exposure control, adjustment of the mA and/or kV according to patient size and/or use of iterative reconstruction technique. CONTRAST:  OMNIPAQUE IOHEXOL 350 MG/ML SOLN, OMNIPAQUE IOHEXOL 350 MG/ML SOLN COMPARISON:  09/26/2019 FINDINGS: VASCULAR Aorta: 3.2 x 3.7 cm  fusiform infrarenal abdominal aortic aneurysm. Moderate superimposed atherosclerotic calcification. No periaortic inflammatory change or fluid collection identified. Celiac: Variant anatomy with common origin of the celiac axis and superior mesenteric artery. No evidence of hemodynamically significant stenosis. No aneurysm or dissection. SMA: See above Renals: Single right and dual left renal arteries. 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 No evidence of hemodynamically significant stenosis involving the left renal arteries. Normal vascular morphology. No aneurysm or dissection. IMA: Patent without evidence of aneurysm, dissection, vasculitis or significant stenosis. RIGHT Lower Extremity Inflow: Extensive mixed atherosclerotic plaque within the right lower extremity arterial inflow without evidence of hemodynamically significant stenosis. Internal iliac artery is widely patent. No aneurysm or dissection. Outflow: Diffuse scattered moderate atherosclerotic calcification within the SFA without evidence of hemodynamically significant stenosis. Common femoral artery and profundus femoral arteries are widely patent. Runoff: Short segment dissection of the right P1 popliteal artery. 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 Distal popliteal artery and tibioperoneal trunk are widely patent. Anterior tibial artery occludes shortly beyond its takeoff. Peroneal artery and posterior tibial artery are patent to the level of the ankle with patency of the plantar arch. Dorsalis pedis artery is occluded. LEFT Lower Extremity Inflow: Scattered atherosclerotic calcification without evidence of hemodynamically significant stenosis involving the left lower extremity arterial inflow. Internal iliac artery is patent at its origin. Outflow: Surgical changes of right superficial femoral arterial-popliteal artery bypass grafting has been performed. The superficial femoral artery proximally as  well as the bypass graft are thrombosed. The native superficial femoral artery is thrombosed. The P1 and P2 segments of the popliteal artery are thrombosed. The profundus femoral artery is patent. There is reconstitution of the P3 segment of the popliteal artery via geniculate collaterals. Runoff: See above. Anterior tibial artery is occluded shortly beyond its origin. Peroneal and posterior tibial arteries continue to the ankle with patency of the plantar arch. Dorsalis pedis artery is diminutive but patent. Veins: Surgical changes of left saphenous vein harvesting are noted. The venous structures are otherwise unremarkable on this arterial phase examination. Review of the MIP images confirms the above findings. NON-VASCULAR Lower chest: Bibasilar pulmonary fibrotic change noted. No acute abnormality. Hepatobiliary: No focal liver abnormality is seen. No gallstones, gallbladder wall thickening, or biliary dilatation. Pancreas: Unremarkable Spleen: Unremarkable Adrenals/Urinary Tract: The adrenal glands are unremarkable. The kidneys are normal in size and position. Simple cortical cyst noted within the lower pole the left kidney for which no follow-up imaging is recommended. The kidneys are otherwise unremarkable. Bladder is unremarkable. Stomach/Bowel: Stomach is within normal limits. Appendix appears normal. No evidence of bowel wall thickening, distention, or inflammatory changes. Lymphatic: No pathologic adenopathy within the  abdomen and pelvis. Reproductive: Uterus and bilateral adnexa are unremarkable. Other: No abdominal wall hernia Musculoskeletal: No acute or significant osseous findings. IMPRESSION: 1. 3.2 x 3.7 cm fusiform infrarenal abdominal aortic aneurysm. Recommend follow-up ultrasound every 2 years. 2. 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 3. Short segment dissection of the right P1 popliteal artery. 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 4. Left lower  extremity arterial outflow disease with surgical changes of left superficial femoral arterial-popliteal artery bypass grafting. The superficial femoral artery proximally as well as the bypass graft are thrombosed. The native superficial femoral artery is thrombosed. The P1 and P2 segments of the popliteal artery are thrombosed. There is reconstitution of the P3 segment of the popliteal artery via geniculate collaterals. 5. Two-vessel runoff bilaterally. Aortic Atherosclerosis (ICD10-I70.0). Electronically Signed   By: Helyn Numbers M.D.   On: 07/16/2023 19:54    EKG: Independently reviewed. Sinus rhythm, LAD and LVH with repolarization abnormality.   Assessment/Plan   1. Critical limb ischemia - Appreciate vascular surgery assessment and recs, planned for angiogram 11/6  - Continue pain-control, supportive care, NPO after midnight   2. Hx of CVA - Continue ASA 81; she could not tolerate statins    3. Emphysema; pulmonary fibrosis  - Not in exacerbation on admission  - Continue as-needed albuterol    4. Hypertension  - Continue Norvasc and losartan    5. Depression, anxiety  - Continue Cymbalta    6. AAA; aneurysm of ascending thoracic aorta - Noted on CTA in ED, outpatient follow-up recommended    DVT prophylaxis: Lovenox   Code Status: Full  Level of Care: Level of care: Med-Surg Family Communication: None present   Disposition Plan:  Patient is from: Home  Anticipated d/c is to: TBD Anticipated d/c date is: 07/19/23 Patient currently: Pending angiogram  Consults called: Vascular surgery  Admission status: Inpatient     Briscoe Deutscher, MD [facility]  07/16/2023, 9:06 PM

## 2023-07-16 NOTE — H&P (View-Only) (Signed)
I reviewed her CTA aortobifem.  Now that I can see the images her native left SFA and left SFA to below knee popliteal bypass are occluded.  Will plan angiogram tomorrow.  Discussed keeping her n.p.o. after midnight.  If she has no endovascular options would have to seriously consider if her symptoms would warrant a redo bypass as she does not feel can go through another bypass procedure.  Much more debilitated since her stroke.  Cephus Shelling, MD [facility] Office: [phone]   Cephus Shelling

## 2023-07-16 NOTE — ED Notes (Signed)
Patient transported to CT 

## 2023-07-16 NOTE — Telephone Encounter (Signed)
Caller name: LEAHANN LEMPKE  On DPR?: Yes  Call back number: [phone] (mobile)  Provider they see: Shade Flood, MD  Reason for call:   Pt called all her toes are purple and she's in a lot of pain. Please call her.

## 2023-07-16 NOTE — Progress Notes (Signed)
I reviewed her CTA aortobifem.  Now that I can see the images her native left SFA and left SFA to below knee popliteal bypass are occluded.  Will plan angiogram tomorrow.  Discussed keeping her n.p.o. after midnight.  If she has no endovascular options would have to seriously consider if her symptoms would warrant a redo bypass as she does not feel can go through another bypass procedure.  Much more debilitated since her stroke.  Cephus Shelling, MD [facility] Office: [phone]   Cephus Shelling

## 2023-07-16 NOTE — ED Provider Notes (Signed)
  Physical Exam  BP (!) 165/82   Pulse 85   Temp 97.7 F (36.5 C) (Oral)   Resp 20   SpO2 100%   Physical Exam  Procedures  Procedures  ED Course / MDM    Medical Decision Making Amount and/or Complexity of Data Reviewed Labs: ordered. Radiology: ordered.  Risk Prescription drug management. Decision regarding hospitalization.   Possible claudication or vascular issues.  CTs and to be seen by vascular.  Discussed CT results with Dr. Chestine Spore from vascular surgery.  Graft is potentially down but states he has some doubts due to pulse that she had in her foot.  Recommends admission to hospitalist service.  N.p.o. at midnight and will likely have angiography tomorrow.  Patient reportedly would not want surgery but would do endovascular repair.  Will discuss with hospitalist.       Benjiman Core, MD 07/16/23 2027

## 2023-07-16 NOTE — Consult Note (Addendum)
Hospital Consult    Reason for Consult:  discolored toes with hx of left leg bypass Requesting Physician:  Rhunette Croft MRN #:  [id number]  History of Present Illness: This is a 77 y.o. female who presented to the hospital with discolored toes with pain and her fingers turn purple when she is cold.    On 09/13/2018, she underwent angiogram with runoff due to rest pain in the left foot.  She was found to have a chronic left popliteal occlusion with reconstitution of the below knee popliteal artery and one vessel peroneal runoff.  She had inline flow with atherosclerotic changes in the right leg with two vessel runoff of the peroneal and PT.  She did not have any significant aorto-occlusive disease as well as possible small AAA, which correlated with 3cm AAA seen on CT scan in November 2019.  On 10/22/2018, she underwent left SFA to BK popliteal artery bypass using non reversed GSV for rest pain by Dr. Darrick Penna.  She subsequently underwent I&D of left thigh wound for infection also by Dr. Darrick Penna.   She came to the ER today due to pain and numbness in the toes of the left foot. She states she does have some pain in the right foot as well but it is not as bad as the left.  She states that her pain gets better when she puts her feet on the floor.  She states that she sleeps on the couch and when she puts her feet down, they feel better.  She does not have any non healing wounds.    She states she doesn't walk much because she had a stroke a couple of months ago and went to rehab for that.  She had right sided weakness and chest pain and hypertensive urgency in August 2024.  She had a CTA of the neck and no flow limiting stenosis noted.   She has PMH also significant for HTN, hypothyroidism, meningioma, CKD, psoriasis, depression, bipolar disorder.    She is on home O2 as needed.   The pt is on a statin for cholesterol management.  The pt is on a daily aspirin.   Other AC:  none The pt is on CCB, ARB for  hypertension.   The pt is not on medication for diabetes PTA. Tobacco hx:  current  Past Medical History:  Diagnosis Date   AAA (abdominal aortic aneurysm) (HCC)    3.1 cm 07/08/18, 3 year follow-up recommended; possible 3 cm AAA by aortogram 09/13/18   Anemia    PMH   Appendicitis with abscess    07/08/18, s/p perc drain; resolved 07/30/18 by CT   Arthritis    Cerebrovascular disease    intra and extracranial vascular dx per MRI 4/11, neurology rec strict CVRF control   Colonic inertia    Constipation    chronic;severe   Depression    Duodenitis    Gastritis    GERD (gastroesophageal reflux disease)    Hypertension    Hypothyroid 01/16/2014   Meningioma (HCC) 10/21/2013   PAD (peripheral artery disease) (HCC)    Psoriasis    sees derm   Wears dentures    Wears glasses     Past Surgical History:  Procedure Laterality Date   ABDOMINAL AORTOGRAM W/LOWER EXTREMITY N/A 09/13/2018   Procedure: ABDOMINAL AORTOGRAM W/LOWER EXTREMITY;  Surgeon: Sherren Kerns, MD;  Location: [facility];  Service: Cardiovascular;  Laterality: N/A;   arthroscopy  04/2010   Right knee   CATARACT  EXTRACTION W/ INTRAOCULAR LENS  IMPLANT, BILATERAL     COLONOSCOPY     FEMORAL-POPLITEAL BYPASS GRAFT  10/22/2018   FEMORAL-POPLITEAL BYPASS GRAFT Left 10/22/2018   Procedure: LEFT FEMORAL TO BELOW THE KNEE POPLITEAL ARTERY BYPASS GRAFT;  Surgeon: Sherren Kerns, MD;  Location: [facility];  Service: Vascular;  Laterality: Left;   I & D EXTREMITY Left 11/08/2018   Procedure: IRRIGATION AND DEBRIDEMENT EXTREMITY Left Leg;  Surgeon: Sherren Kerns, MD;  Location: [facility] OR;  Service: Vascular;  Laterality: Left;   IR RADIOLOGIST EVAL & MGMT  07/30/2018   MULTIPLE TOOTH EXTRACTIONS     TUBAL LIGATION      No Known Allergies  Prior to Admission medications   Medication Sig Start Date End Date Taking? Authorizing Provider  albuterol (VENTOLIN HFA) 108 (90 Base) MCG/ACT inhaler Inhale 1-2 puffs into the  lungs every 6 (six) hours as needed for wheezing or shortness of breath. 05/31/23   Shade Flood, MD  amLODipine (NORVASC) 5 MG tablet Take 1 tablet (5 mg total) by mouth daily. 02/07/23   Rollene Rotunda, MD  aspirin EC 81 MG tablet Take 81 mg by mouth daily. Swallow whole.    [provider]  clobetasol ointment (TEMOVATE) 0.05 % Apply topically 2 (two) times daily. Apply to affected areas with psoriatic rash Patient not taking: Reported on 05/28/2023 03/06/23   Rai, Delene Ruffini, MD  DULoxetine (CYMBALTA) 30 MG capsule Take 1 capsule (30 mg total) by mouth daily. 07/04/23   Cottle, Steva Ready., MD  famotidine (PEPCID) 20 MG tablet Take 20 mg by mouth daily as needed for indigestion. Patient not taking: Reported on 05/28/2023    [provider]  losartan (COZAAR) 25 MG tablet Take 1 tablet (25 mg total) by mouth daily. 02/23/23 02/23/24  Rollene Rotunda, MD  lovastatin (MEVACOR) 20 MG tablet Take 1 tablet (20 mg total) by mouth at bedtime. 05/31/23   Shade Flood, MD  Melatonin 10 MG TABS Take 10 mg by mouth at bedtime as needed (for sleep). Patient not taking: Reported on 05/28/2023    [provider]  polyethylene glycol (MIRALAX / GLYCOLAX) 17 g packet Take 34 g by mouth daily. Patient not taking: Reported on 05/28/2023 03/08/23   Rolly Salter, MD  rOPINIRole (REQUIP) 2 MG tablet TAKE 1 TABLET(2 MG) BY MOUTH AT BEDTIME 03/30/23   Cottle, Steva Ready., MD  simethicone (MYLICON) 80 MG chewable tablet Chew 1 tablet (80 mg total) by mouth 4 (four) times daily. Patient not taking: Reported on 05/28/2023 03/07/23   Rolly Salter, MD    Social History   Socioeconomic History   Marital status: Widowed    Spouse name: Not on file   Number of children: 1   Years of education: Not on file   Highest education level: Not on file  Occupational History   Occupation: retired  Tobacco Use   Smoking status: Every Day    Current packs/day: 0.00    Average packs/day: 0.5  packs/day for 60.0 years (30.0 ttl pk-yrs)    Types: Cigarettes    Start date: 11/10/1959    Last attempt to quit: 11/10/2019    Years since quitting: 3.6   Smokeless tobacco: Never  Vaping Use   Vaping status: Former  Substance and Sexual Activity   Alcohol use: Yes    Alcohol/week: 0.0 standard drinks of alcohol    Comment: yes on occassion   Drug use: No   Sexual  activity: Not Currently  Other Topics Concern   Not on file  Social History Narrative   Brother in law Mr [place] Neighbor   One son in [place].    Social Determinants of Health   Financial Resource Strain: Low Risk  (03/23/2021)   Overall Financial Resource Strain (CARDIA)    Difficulty of Paying Living Expenses: Not hard at all  Food Insecurity: No Food Insecurity (06/13/2023)   Hunger Vital Sign    Worried About Running Out of Food in the Last Year: Never true    Ran Out of Food in the Last Year: Never true  Transportation Needs: No Transportation Needs (06/13/2023)   PRAPARE - Administrator, Civil Service (Medical): No    Lack of Transportation (Non-Medical): No  Physical Activity: Inactive (06/13/2023)   Exercise Vital Sign    Days of Exercise per Week: 0 days    Minutes of Exercise per Session: 0 min  Stress: Stress Concern Present (06/13/2023)   [employer] of Occupational Health - Occupational Stress Questionnaire    Feeling of Stress : Very much  Social Connections: Socially Isolated (06/13/2023)   Social Connection and Isolation Panel [NHANES]    Frequency of Communication with Friends and Family: Three times a week    Frequency of Social Gatherings with Friends and Family: Twice a week    Attends Religious Services: Never    Database administrator or Organizations: No    Attends Banker Meetings: Never    Marital Status: Widowed  Intimate Partner Violence: Not At Risk (06/13/2023)   Humiliation, Afraid, Rape, and Kick questionnaire    Fear of Current or Ex-Partner: No     Emotionally Abused: No    Physically Abused: No    Sexually Abused: No     Family History  Problem Relation Age of Onset   Breast cancer Mother        metastisis to bones   Cancer Sister 61       spinal   Alcohol abuse Brother    Heart disease Brother    Lung cancer Brother        smoked   Diabetes Son    Heart disease Maternal Aunt    Heart disease Other        grandfather    Colon cancer Neg Hx    Esophageal cancer Neg Hx    Stomach cancer Neg Hx     ROS: [x]  Positive   [ ]  Negative   [ ]  All sytems reviewed and are negative  Cardiac: []  chest pain/pressure []  hx MI []  SOB   Vascular: []  pain in legs while walking [x]  pain in legs at rest [x]  pain in legs at night []  non-healing ulcers  Pulmonary: []  asthma/wheezing []  home O2  Neurologic: [x]  hx of CVA   Hematologic: []  hx of cancer  Endocrine:   []  diabetes [x]  thyroid disease  GI []  GERD  GU: [x]  Hx of CKD  Psychiatric: []  anxiety [x]  depression  Musculoskeletal: [x]  arthritis []  joint pain  Integumentary: []  rashes []  ulcers  Constitutional: []  fever  []  chills  Physical Examination  Vitals:   07/16/23 1223  BP: (!) 165/82  Pulse: 85  Resp: 20  Temp: 97.7 F (36.5 C)  SpO2: 100%   There is no height or weight on file to calculate BMI.  General:  WDWN in NAD Gait: Not observed HENT: WNL, normocephalic Pulmonary: normal non-labored breathing Cardiac: regular Abdomen:  soft,  NT; aortic pulse is not palpable Skin: without rashes Vascular Exam/Pulses:  Right Left  Radial 2+ (normal) 2+ (normal)  Femoral 2+ (normal) 2+ (normal)  DP absent absent  PT absent absent  Peroneal Monophasic doppler Monophasic doppler   Extremities: toes darkened on left foot.  No ulcerations present on either foot.   Musculoskeletal: no muscle wasting or atrophy  Neurologic: A&O X 3 Psychiatric:  The pt has Normal affect.   CBC    Component Value Date/Time   WBC 7.0 03/04/2023 0607    RBC 4.44 03/04/2023 0607   HGB 13.1 03/04/2023 0607   HGB 11.9 01/02/2020 1602   HCT 39.6 03/04/2023 0607   HCT 36.4 01/02/2020 1602   PLT 260 03/04/2023 0607   PLT 370 01/02/2020 1602   MCV 89.2 03/04/2023 0607   MCV 90 01/02/2020 1602   MCH 29.5 03/04/2023 0607   MCHC 33.1 03/04/2023 0607   RDW 13.9 03/04/2023 0607   RDW 14.0 01/02/2020 1602   LYMPHSABS 2.0 03/03/2023 0336   LYMPHSABS 2.7 01/02/2020 1602   MONOABS 0.7 03/03/2023 0336   EOSABS 0.3 03/03/2023 0336   EOSABS 0.1 01/02/2020 1602   BASOSABS 0.0 03/03/2023 0336   BASOSABS 0.0 01/02/2020 1602    BMET    Component Value Date/Time   NA 134 (L) 07/16/2023 1254   NA 135 01/02/2020 1602   K 4.6 07/16/2023 1254   CL 98 07/16/2023 1254   CO2 20 (L) 07/16/2023 1254   GLUCOSE 105 (H) 07/16/2023 1254   BUN 17 07/16/2023 1254   BUN 17 01/02/2020 1602   CREATININE 0.98 07/16/2023 1254   CREATININE 0.77 07/28/2015 1805   CALCIUM 9.5 07/16/2023 1254   GFRNONAA 59 (L) 07/16/2023 1254   GFRAA 58 (L) 01/02/2020 1602    COAGS: Lab Results  Component Value Date   INR 1.1 03/03/2023   INR 1.1 10/05/2022   INR 1.5 (H) 11/10/2019     Non-Invasive Vascular Imaging:   CTA a/p with BLE runoff pending    ASSESSMENT/PLAN: This is a 77 y.o. female with hx of  left SFA to BK popliteal artery bypass using non reversed GSV for rest pain by Dr. Darrick Penna.  She subsequently underwent I&D of left thigh wound for infection also by Dr. Darrick Penna who presents today with toe discoloration and numbness in the left foot > right foot.   -pt with critical limb ischemia with rest pain in the left foot.  I am able to get a monophasic peroneal doppler signal bilaterally.   -continue asa/statin  -stat CTA Aortobifem with runoff has been ordered stat.  Will await the results of this.  -she has hx of AAA-it measured 3.2cm in June 2024 and 3 year follow up recommended.  It was essentially unchanged from angiogram in 2020. -discussed importance of  smoking cessation and that it puts her at risk for limb loss.  -continue npo until Dr. Chestine Spore has seen pt.  -Dr. Chestine Spore to evaluate pt and determine further plan   Doreatha Massed, PA-C [facility] [phone]  I have seen and evaluated the patient. I agree with the PA note as documented above.  77 year old female previous underwent a left SFA to below-knee popliteal bypass with vein by Dr. Darrick Penna in 2020.  She has not been seen in our practice in a number years.  Here complaining of discoloration in all extremities and also some discomfort on the bottom of her left foot.  She has purple hue to the fingers  and both upper extremities as well as a purple hue to both lower extremities.  She states this is worse when she is cold.  It certainly is suggestive of Raynaud's.  That being said she has had a left leg bypass.  I do get a pretty brisk peroneal signal at the left ankle and the foot is motor/ sensory intact.  Will follow-up the CTA once that is done.  Cephus Shelling, MD [facility] [place] Office: [phone]

## 2023-07-16 NOTE — Plan of Care (Signed)
  Evaluation after Contrast Extravasation  Patient seen and examined immediately after contrast extravasation while in CT2. Left upper extremity contrast extravasation near the left AC at previous IV site, IV has been removed prior to my arrival - there is a small puncture site evident with contrast leaking from the site. Per CT tech she has expressed some contrast from this site, has not noted any blisters. Approximately 100 cc Omnipaque extravasation  Exam: There is minimal swelling at the left [facility] area.  There is no erythema. There is discoloration (hematoma) There are no blisters. There are no signs of decreased perfusion of the skin.  It is warm to touch.  The patient has full ROM in fingers.  Radial pulse palpable.   Plan - Keep arm elevated as much as possible. - Ice pack x 20 minutes Q2H while awake, may discontinue if patient cannot tolerate  Call the radiology department at [phone] if there is: - increase in pain or swelling - changed or altered sensation - ulceration or blistering - increasing redness - warmth or increasing firmness - decreased tissue perfusion as noted by decreased capillary refill or discoloration of skin - decreased pulses peripheral to site  [employer] PA-C 07/16/2023 3:07 PM

## 2023-07-17 ENCOUNTER — Inpatient Hospital Stay (HOSPITAL_COMMUNITY)
Admission: EM | Disposition: A | Discharge status: Skilled Nursing Facility | Payer: Self-pay | Source: Home / Self Care | Attending: Internal Medicine

## 2023-07-17 DIAGNOSIS — I77811 Abdominal aortic ectasia: Secondary | ICD-10-CM

## 2023-07-17 DIAGNOSIS — T82868A Thrombosis of vascular prosthetic devices, implants and grafts, initial encounter: Secondary | ICD-10-CM

## 2023-07-17 DIAGNOSIS — I998 Other disorder of circulatory system: Secondary | ICD-10-CM

## 2023-07-17 DIAGNOSIS — Z9889 Other specified postprocedural states: Secondary | ICD-10-CM

## 2023-07-17 DIAGNOSIS — I743 Embolism and thrombosis of arteries of the lower extremities: Secondary | ICD-10-CM | POA: Diagnosis not present

## 2023-07-17 HISTORY — PX: ABDOMINAL AORTOGRAM W/LOWER EXTREMITY: CATH118223

## 2023-07-17 LAB — CBC
HCT: 36.9 % (ref 36.0–46.0)
HCT: 37.8 % (ref 36.0–46.0)
HCT: 37.1 % (ref 36.0–46.0)
Hemoglobin: 11.4 g/dL — ABNORMAL LOW (ref 12.0–15.0)
Hemoglobin: 12 g/dL (ref 12.0–15.0)
Hemoglobin: 11.7 g/dL — ABNORMAL LOW (ref 12.0–15.0)
MCH: 27 pg (ref 26.0–34.0)
MCH: 27.4 pg (ref 26.0–34.0)
MCH: 27.5 pg (ref 26.0–34.0)
MCHC: 30.9 g/dL (ref 30.0–36.0)
MCHC: 31.7 g/dL (ref 30.0–36.0)
MCHC: 31.5 g/dL (ref 30.0–36.0)
MCV: 87.4 fL (ref 80.0–100.0)
MCV: 86.3 fL (ref 80.0–100.0)
MCV: 87.3 fL (ref 80.0–100.0)
Platelets: 298 10*3/uL (ref 150–400)
Platelets: 323 10*3/uL (ref 150–400)
Platelets: 228 10*3/uL (ref 150–400)
RBC: 4.22 MIL/uL (ref 3.87–5.11)
RBC: 4.38 MIL/uL (ref 3.87–5.11)
RBC: 4.25 MIL/uL (ref 3.87–5.11)
RDW: 14.5 % (ref 11.5–15.5)
RDW: 14.4 % (ref 11.5–15.5)
RDW: 14.4 % (ref 11.5–15.5)
WBC: 7.3 10*3/uL (ref 4.0–10.5)
WBC: 7.8 10*3/uL (ref 4.0–10.5)
WBC: 9.6 10*3/uL (ref 4.0–10.5)
nRBC: 0 % (ref 0.0–0.2)
nRBC: 0 % (ref 0.0–0.2)
nRBC: 0 % (ref 0.0–0.2)

## 2023-07-17 LAB — POCT ACTIVATED CLOTTING TIME: Activated Clotting Time: 233 s

## 2023-07-17 LAB — BASIC METABOLIC PANEL
Anion gap: 8 (ref 5–15)
BUN: 14 mg/dL (ref 8–23)
CO2: 25 mmol/L (ref 22–32)
Calcium: 8.9 mg/dL (ref 8.9–10.3)
Chloride: 99 mmol/L (ref 98–111)
Creatinine, Ser: 0.95 mg/dL (ref 0.44–1.00)
GFR, Estimated: >60 mL/min (ref >60)
Glucose, Bld: 117 mg/dL — ABNORMAL HIGH (ref 70–99)
Potassium: 4 mmol/L (ref 3.5–5.1)
Sodium: 132 mmol/L — ABNORMAL LOW (ref 135–145)

## 2023-07-17 LAB — MRSA NEXT GEN BY PCR, NASAL: MRSA by PCR Next Gen: NOT DETECTED

## 2023-07-17 LAB — HEPARIN LEVEL (UNFRACTIONATED)
Heparin Unfractionated: 0.52 [IU]/mL (ref 0.30–0.70)
Heparin Unfractionated: 0.36 [IU]/mL (ref 0.30–0.70)

## 2023-07-17 LAB — FIBRINOGEN
Fibrinogen: 549 mg/dL — ABNORMAL HIGH (ref 210–475)
Fibrinogen: 339 mg/dL (ref 210–475)

## 2023-07-17 SURGERY — ABDOMINAL AORTOGRAM W/LOWER EXTREMITY
Anesthesia: LOCAL

## 2023-07-17 MED ORDER — FENTANYL CITRATE (PF) 100 MCG/2ML IJ SOLN
INTRAMUSCULAR | Status: DC | PRN
  Administered 2023-07-17: 25 ug via INTRAVENOUS
  Administered 2023-07-17: 50 ug via INTRAVENOUS
  Administered 2023-07-17: 50 ug
  Administered 2023-07-17 (×2): 25 ug via INTRAVENOUS

## 2023-07-17 MED ORDER — IODIXANOL 320 MG/ML IV SOLN
INTRAVENOUS | Status: DC | PRN
  Administered 2023-07-17: 95 mL

## 2023-07-17 MED ORDER — SODIUM CHLORIDE 0.9% FLUSH
3.0000 mL | Freq: Two times a day (BID) | INTRAVENOUS | Status: DC
Start: 2023-07-17 — End: 2023-07-19
  Administered 2023-07-17 – 2023-07-19 (×3): 3 mL via INTRAVENOUS

## 2023-07-17 MED ORDER — CHLORHEXIDINE GLUCONATE CLOTH 2 % EX PADS
6.0000 | MEDICATED_PAD | Freq: Every day | CUTANEOUS | Status: DC
  Administered 2023-07-17 – 2023-07-22 (×6): 6 via TOPICAL

## 2023-07-17 MED ORDER — SODIUM CHLORIDE 0.9 % IV SOLN: 250.0000 mL | INTRAVENOUS | Status: AC | PRN

## 2023-07-17 MED ORDER — ONDANSETRON HCL 4 MG/2ML IJ SOLN
INTRAMUSCULAR | Status: AC
  Filled 2023-07-17: qty 2

## 2023-07-17 MED ORDER — FENTANYL CITRATE PF 50 MCG/ML IJ SOSY
12.5000 ug | PREFILLED_SYRINGE | INTRAMUSCULAR | Status: DC | PRN
  Administered 2023-07-17 – 2023-07-18 (×2): 50 ug via INTRAVENOUS
  Administered 2023-07-19: 12.5 ug via INTRAVENOUS
  Filled 2023-07-17 (×3): qty 1

## 2023-07-17 MED ORDER — HEPARIN (PORCINE) IN NACL 2000-0.9 UNIT/L-% IV SOLN
INTRAVENOUS | Status: DC | PRN
  Administered 2023-07-17: 1000 mL

## 2023-07-17 MED ORDER — MIDAZOLAM HCL 2 MG/2ML IJ SOLN
INTRAMUSCULAR | Status: AC
  Filled 2023-07-17: qty 2

## 2023-07-17 MED ORDER — MORPHINE SULFATE (PF) 2 MG/ML IV SOLN: 5.0000 mg | INTRAVENOUS | Status: DC | PRN

## 2023-07-17 MED ORDER — HEPARIN SODIUM (PORCINE) 1000 UNIT/ML IJ SOLN
INTRAMUSCULAR | Status: DC | PRN
  Administered 2023-07-17: 8000 [IU] via INTRAVENOUS
  Administered 2023-07-17: 1000 [IU] via INTRAVENOUS

## 2023-07-17 MED ORDER — FENTANYL CITRATE (PF) 100 MCG/2ML IJ SOLN
INTRAMUSCULAR | Status: AC
  Filled 2023-07-17: qty 2

## 2023-07-17 MED ORDER — HYDRALAZINE HCL 20 MG/ML IJ SOLN
INTRAMUSCULAR | Status: DC | PRN
  Administered 2023-07-17: 10 mg via INTRAVENOUS

## 2023-07-17 MED ORDER — HEPARIN (PORCINE) 25000 UT/250ML-% IV SOLN
800.0000 [IU]/h | INTRAVENOUS | Status: DC
Start: 2023-07-17 — End: 2023-07-18
  Administered 2023-07-17: 800 [IU]/h via INTRAVENOUS
  Filled 2023-07-17: qty 250

## 2023-07-17 MED ORDER — LIDOCAINE HCL (PF) 1 % IJ SOLN
INTRAMUSCULAR | Status: DC | PRN
  Administered 2023-07-17: 12 mL

## 2023-07-17 MED ORDER — SODIUM CHLORIDE 0.9 % IV SOLN
0.5000 mg/h | INTRAVENOUS | Status: DC
Start: 2023-07-17 — End: 2023-07-18
  Administered 2023-07-17 – 2023-07-18 (×2): 0.5 mg/h
  Filled 2023-07-17 (×3): qty 10

## 2023-07-17 MED ORDER — HYDRALAZINE HCL 20 MG/ML IJ SOLN
INTRAMUSCULAR | Status: AC
  Filled 2023-07-17: qty 1

## 2023-07-17 MED ORDER — LIDOCAINE HCL (PF) 1 % IJ SOLN
INTRAMUSCULAR | Status: AC
  Filled 2023-07-17: qty 30

## 2023-07-17 MED ORDER — SODIUM CHLORIDE 0.9% FLUSH: 3.0000 mL | INTRAVENOUS | Status: DC | PRN

## 2023-07-17 MED ORDER — MIDAZOLAM HCL 2 MG/2ML IJ SOLN
INTRAMUSCULAR | Status: DC | PRN
  Administered 2023-07-17 (×3): 1 mg via INTRAVENOUS

## 2023-07-17 MED ORDER — MORPHINE SULFATE (PF) 2 MG/ML IV SOLN
INTRAVENOUS | Status: AC
  Filled 2023-07-17: qty 1

## 2023-07-17 MED ORDER — SODIUM CHLORIDE 0.9 % IV SOLN: INTRAVENOUS | Status: AC

## 2023-07-17 MED ORDER — FENTANYL CITRATE PF 50 MCG/ML IJ SOSY: 12.5000 ug | PREFILLED_SYRINGE | INTRAMUSCULAR | Status: DC | PRN

## 2023-07-17 SURGICAL SUPPLY — 19 items
CANISTER PENUMBRA ENGINE (MISCELLANEOUS) IMPLANT
CATH INFUS 135X50 (CATHETERS) IMPLANT
CATH LIGHTNING 7 XTORQ 130 (CATHETERS) IMPLANT
CATH OMNI FLUSH 5F 65CM (CATHETERS) IMPLANT
CATH QUICKCROSS SUPP .035X90CM (MICROCATHETER) IMPLANT
DEVICE TORQUE SEADRAGON GRN (MISCELLANEOUS) IMPLANT
GUIDEWIRE ANGLED .035X150CM (WIRE) IMPLANT
KIT MICROPUNCTURE NIT STIFF (SHEATH) IMPLANT
KIT PV (KITS) ×1 IMPLANT
SET ATX-X65L (MISCELLANEOUS) IMPLANT
SHEATH CATAPULT 6FR 45 (SHEATH) IMPLANT
SHEATH CATAPULT 7FR 45 (SHEATH) IMPLANT
SHEATH PINNACLE 5F 10CM (SHEATH) IMPLANT
SHEATH PROBE COVER 6X72 (BAG) IMPLANT
TAPE SHOOT N SEE (TAPE) IMPLANT
TRANSDUCER W/STOPCOCK (MISCELLANEOUS) ×1 IMPLANT
TRAY PV CATH (CUSTOM PROCEDURE TRAY) ×1 IMPLANT
WIRE BENTSON .035X145CM (WIRE) IMPLANT
WIRE G V18X300CM (WIRE) IMPLANT

## 2023-07-17 NOTE — Op Note (Addendum)
Patient name: [name] MRN: [id number] DOB: 20-Jul-1946 Sex: female  07/17/2023 Pre-operative Diagnosis: left leg ischemia Post-operative diagnosis:  Same Surgeon:  [name] Procedure Performed:  1.  U/s Guided access  2.  Aortogram with bifemoral runoff  3.  Left leg angiogram  4.  Selective injection with catheter in left popliteal artery  5.  Mechanical thrombectomy of left superficial femoral artery and above-knee popliteal to below-knee popliteal bypass graft  6.  Initiation of thrombolytic therapy  7.  Conscious sedation, 54 minutes   Indications: This is a 77 year old female with history of left above-knee to below-knee popliteal artery bypass graft for rest pain in 2020.  She has a history of stroke in June which was an ischemic stroke.  She has developed discoloration to the toes on her left foot as well as rest pain.  Imaging has revealed her bypass graft is occluded.  She comes in today for further evaluation and possible intervention  Procedure:  The patient was identified in the holding area and taken to room 8.  The patient was then placed supine on the table and prepped and draped in the usual sterile fashion.  A time out was called.  Conscious sedation was administered with the use of IV fentanyl and Versed under continuous physician and nurse monitoring.  Heart rate, blood pressure, and oxygen saturation were continuously monitored.  Total sedation time was 54 minutes.  Ultrasound was used to evaluate the right common femoral artery.  It was patent .  A digital ultrasound image was acquired.  A micropuncture needle was used to access the right common femoral artery under ultrasound guidance.  An 018 wire was advanced without resistance and a micropuncture sheath was placed.  The 018 wire was removed and a benson wire was placed.  The micropuncture sheath was exchanged for a 5 french sheath.  An omniflush catheter was advanced over the wire to the level of L-1.  An  abdominal angiogram was obtained.  Next, using the omniflush catheter and a benson wire, the aortic bifurcation was crossed and the catheter was placed into theleft external iliac artery and left runoff was obtained.  Findings:   Aortogram: No significant renal artery stenosis.  The infrarenal abdominal aorta is ectatic but without stenosis.  Bilateral common and external iliac arteries are widely patent.  Bilateral common femoral arteries are widely patent  Right Lower Extremity: The left common femoral and profundofemoral artery are widely patent.  There is a superficial femoral artery occlusion with a small proximal stump.  The superficial femoral artery does not opacify retrograde through the bypass graft.  There is two-vessel runoff on the posterior tibial and peroneal artery  Left Lower Extremity: Not evaluated  Intervention: After the above images were acquired the decision was made to proceed with intervention.  A 7 French 45 cm sheath was inserted into the left extrailiac artery.  The patient was fully heparinized.  Next, using a 035 Glidewire and a quick cross catheter, the superficial femoral artery was selected.  The wire was then able to be navigated into the distal superficial femoral artery and through the metallic ring that represented the proximal anastomosis of the bypass graft.  A selective contrast injection with a catheter in the popliteal artery and the bypass graft was performed confirming successful crossing.  I then placed a long Bentson wire.  I used the penumbra CAT 7 device to perform mechanical thrombectomy of the left superficial femoral artery as well as  the left above-knee to below-knee popliteal bypass graft.  Multiple passes were made.  On follow-up imaging there did appear to be some residual thrombus up near the proximal anastomosis.  I contemplated stenting this, however ultimately I decided to place a thrombolytic catheter into the SFA and bypass graft and run her at  low-dose tPA overnight to see if this thrombus will resolve.  She will be brought back tomorrow for follow-up study  Impression:  #1  Occluded left superficial femoral artery  #2  Mechanical thrombectomy of left superficial femoral artery and above-knee to below-knee popliteal artery bypass graft with penumbra CAT 7  #3  Patient will be brought back tomorrow for follow-up study   V. [name], M.D., [facility] [place] Office: [phone] Pager:  [phone]

## 2023-07-17 NOTE — Interval H&P Note (Signed)
History and Physical Interval Note:  07/17/2023 1:14 PM  [name]  has presented today for surgery, with the diagnosis of claudication.  The various methods of treatment have been discussed with the patient and family. After consideration of risks, benefits and other options for treatment, the patient has consented to  Procedure(s): ABDOMINAL AORTOGRAM W/LOWER EXTREMITY (N/A) as a surgical intervention.  The patient's history has been reviewed, patient examined, no change in status, stable for surgery.  I have reviewed the patient's chart and labs.  Questions were answered to the patient's satisfaction.     [name]

## 2023-07-17 NOTE — Progress Notes (Signed)
Sheath remains in place to right groin with Heparin infusing at 8cc/800units per hour and Ateplase infusing at  0.5mg /hour/25cc/hour, reddish drainage noted on gauze at distal end of sheath where orange port connects to catheter, Leta Jungling, [facility] called to bedside, pressure bag with saline added to blue port, safety maintained

## 2023-07-17 NOTE — Progress Notes (Addendum)
Pt in 2H13, right groin sheath continues oozing (see previous note), pressure bag unable to place pressure to line r/t valve unable to turn in correct direction, Dorena Dew, to bedside for assistance, 2H RN's present, Weston Brass, notified Dr. Myra Gianotti of current situation, instruction given to Dorena Dew by Dr. Myra Gianotti

## 2023-07-17 NOTE — Progress Notes (Signed)
[facility] Progress Note  Patient: [name]    WUJ:[id number]  DOA: 07/16/2023     Date of Service: the patient was seen and examined on 07/17/2023  Chief Complaint  Patient presents with   Weakness   Brief hospital course:  [name] is a 77 y.o. female with medical history significant for hypertension, depression, anxiety, emphysema, pulmonary fibrosis, history of CVA, and PAD status post left femoral to below the knee popliteal artery bypass grafting in February 2020 who now presents with pain and discoloration of the distal extremities.   Patient reports worsening lower extremity pain over the past 1 to 2 months, particularly in the left foot.  She has also noted numbness developing in her left foot. She reports improvement in pain when her foot is kept in a dependent position.  She denies fevers, chills, or active chest pain.   ED Course: Upon arrival to the ED, patient is found to be afebrile and saturating well on room air with normal heart rate and stable blood pressure.  Imaging demonstrates thrombosis with occlusion of her native left SFA and left SFA to popliteal bypass.   Vascular surgery evaluated the patient in the ED and recommends admission with plan for angiogram on 11/5.   Assessment and Plan:  # Critical limb ischemia - Appreciate vascular surgery assessment and recs, planned for angiogram 11/5 - Continue pain-control, supportive care, NPO after midnight    # Hx of CVA - Continue ASA 81; she could not tolerate statins     # Emphysema; pulmonary fibrosis  - Not in exacerbation on admission  - Continue as-needed albuterol     # Hypertension  - Continue Norvasc and losartan     # Depression, anxiety  - Continue Cymbalta     # AAA; aneurysm of ascending thoracic aorta - Noted on CTA in ED, outpatient follow-up recommended      Body mass index is 25.69 kg/m.  Interventions:     Diet: NPO DVT Prophylaxis: Subcutaneous Lovenox   Advance  goals of care discussion: Full code  Family Communication: family was not present at bedside, at the time of interview.  The pt provided permission to discuss medical plan with the family. Opportunity was given to ask question and all questions were answered satisfactorily.   Disposition:  Pt is from Home, admitted with LLE PAD, ischemia due to thrombosis, still has pain and is scheduled for angiogram today, which precludes a safe discharge. Discharge to home, when stable and cleared by vascular surgery.  Subjective: No significant events overnight, patient was complaining of left lower extremity pain 10/10 earlier which resolved, currently patient is pain-free.  Patient was also complaining of right inguinal pain which is off and on, no any other complaints.  Physical Exam: General: NAD, lying comfortably Appear in no distress, affect appropriate Eyes: PERRLA ENT: Oral Mucosa Clear, moist  Neck: no JVD,  Cardiovascular: S1 and S2 Present, no Murmur,  Respiratory: good respiratory effort, Bilateral Air entry equal and Decreased, no Crackles, no wheezes Abdomen: Bowel Sound present, Soft and no tenderness,  Skin: no rashes Extremities: no Pedal edema, no calf tenderness, left toe and foot Numbness, mild tenderness at right inguinal area, no significant findings Neurologic: without any new focal findings Gait not checked due to patient safety concerns  Vitals:   07/17/23 0330 07/17/23 0746 07/17/23 1042 07/17/23 1329  BP: (!) 143/70 (!) 160/84 (!) 159/81   Pulse: 71 89 (!) 59   Resp: 18  19    Temp: 98.6 F (37 C) 98.2 F (36.8 C) 98 F (36.7 C)   TempSrc:  Oral Oral   SpO2: 93% 97% 98% 98%  Weight:      Height:        Intake/Output Summary (Last 24 hours) at 07/17/2023 1355 Last data filed at 07/16/2023 2215 Gross per 24 hour  Intake --  Output 700 ml  Net -700 ml   Filed Weights   07/16/23 2227  Weight: 74.4 kg    Data Reviewed: I have personally reviewed and  interpreted daily labs, tele strips, imagings as discussed above. I reviewed all nursing notes, pharmacy notes, vitals, pertinent old records I have discussed plan of care as described above with RN and patient/family.  CBC: Recent Labs  Lab 07/16/23 1433 07/17/23 0537  WBC 7.1 7.3  HGB 11.7* 11.4*  HCT 38.7 36.9  MCV 89.2 87.4  PLT 324 298   Basic Metabolic Panel: Recent Labs  Lab 07/16/23 1254 07/17/23 0537  NA 134* 132*  K 4.6 4.0  CL 98 99  CO2 20* 25  GLUCOSE 105* 117*  BUN 17 14  CREATININE 0.98 0.95  CALCIUM 9.5 8.9    Studies: CT Angio Chest Aorta W and/or Wo Contrast  Result Date: 07/16/2023 CLINICAL DATA:  Peripheral ischemia, dyspnea, dizziness, weakness EXAM: CT ANGIOGRAPHY CHEST WITH CONTRAST TECHNIQUE: Multidetector CT imaging of the chest was performed using the standard protocol during bolus administration of intravenous contrast. Multiplanar CT image reconstructions and MIPs were obtained to evaluate the vascular anatomy. RADIATION DOSE REDUCTION: This exam was performed according to the departmental dose-optimization program which includes automated exposure control, adjustment of the mA and/or kV according to patient size and/or use of iterative reconstruction technique. CONTRAST:  OMNIPAQUE IOHEXOL 350 MG/ML SOLN, OMNIPAQUE IOHEXOL 350 MG/ML SOLN COMPARISON:  02/13/2023 FINDINGS: Cardiovascular: There is adequate opacification of the pulmonary arterial tree. No intraluminal filling defect identified to suggest acute pulmonary embolism. The central pulmonary arteries are of normal caliber. Mild coronary artery calcification. Global cardiac size is within normal limits. No pericardial effusion. 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 Superimposed moderate atherosclerotic calcification. Ascending aorta is of normal  caliber. No superimposed intramural hematoma or dissection. Arch vasculature is widely patent proximally. Mediastinum/Nodes: No enlarged mediastinal, hilar, or axillary lymph nodes. Thyroid gland, trachea, and esophagus demonstrate no significant findings. Lungs/Pleura: Moderate emphysema. Bibasilar pulmonary fibrotic change again noted. Right middle lobe pulmonary nodule, axial image # 69/12, stable since remote prior examinations of 11/17/2019 and safely considered benign. No confluent pulmonary infiltrate. No pneumothorax or pleural effusion. No central obstructing lesion. Upper Abdomen: No acute abnormality. Musculoskeletal: Osseous structures are age-appropriate. No acute bone abnormality. No lytic or blastic bone lesion. Review of the MIP images confirms the above findings. IMPRESSION: 1. No pulmonary embolism. No acute intrathoracic pathology identified. 2. Mild coronary artery calcification. 3. 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 Recommend annual imaging followup by CTA or MRA. This recommendation follows 2010 ACCF/AHA/AATS/ACR/ASA/SCA/SCAI/SIR/STS/SVM Guidelines for the Diagnosis and Management of Patients with Thoracic Aortic Disease. Circulation.2010; 121: W295-A213. Aortic aneurysm NOS (ICD10-I71.9) 4. Moderate emphysema. 5. Stable bibasilar pulmonary fibrotic change. Aortic Atherosclerosis (ICD10-I70.0) and Emphysema (ICD10-J43.9). Electronically Signed   By: Helyn Numbers M.D.   On: 07/16/2023 19:59   CT ANGIO AO+BIFEM W & OR WO  CONTRAST  Result Date: 07/16/2023 CLINICAL DATA:  Claudication or leg ischemia EXAM: CT ANGIOGRAPHY OF ABDOMINAL AORTA WITH ILIOFEMORAL RUNOFF TECHNIQUE: Multidetector CT imaging of the abdomen, pelvis and lower extremities was performed using the standard protocol during bolus administration of intravenous contrast. Multiplanar CT image reconstructions and MIPs were obtained to evaluate the vascular anatomy. RADIATION  DOSE REDUCTION: This exam was performed according to the departmental dose-optimization program which includes automated exposure control, adjustment of the mA and/or kV according to patient size and/or use of iterative reconstruction technique. CONTRAST:  OMNIPAQUE IOHEXOL 350 MG/ML SOLN, OMNIPAQUE IOHEXOL 350 MG/ML SOLN COMPARISON:  09/26/2019 FINDINGS: VASCULAR Aorta: 3.2 x 3.7 cm fusiform infrarenal abdominal aortic aneurysm. Moderate superimposed atherosclerotic calcification. No periaortic inflammatory change or fluid collection identified. Celiac: Variant anatomy with common origin of the celiac axis and superior mesenteric artery. No evidence of hemodynamically significant stenosis. No aneurysm or dissection. SMA: See above Renals: Single right and dual left renal arteries. 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 No evidence of hemodynamically significant stenosis involving the left renal arteries. Normal vascular morphology. No aneurysm or dissection. IMA: Patent without evidence of aneurysm, dissection, vasculitis or significant stenosis. RIGHT Lower Extremity Inflow: Extensive mixed atherosclerotic plaque within the right lower extremity arterial inflow without evidence of hemodynamically significant stenosis. Internal iliac artery is widely patent. No aneurysm or dissection. Outflow: Diffuse scattered moderate atherosclerotic calcification within the SFA without evidence of hemodynamically significant stenosis. Common femoral artery and profundus femoral arteries are widely patent. Runoff: Short segment dissection of the right P1 popliteal artery. 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 Distal popliteal artery and tibioperoneal trunk are widely patent. Anterior tibial artery occludes shortly beyond its takeoff. Peroneal artery and posterior tibial artery are patent to the level of the ankle with patency of the plantar arch. Dorsalis pedis artery is  occluded. LEFT Lower Extremity Inflow: Scattered atherosclerotic calcification without evidence of hemodynamically significant stenosis involving the left lower extremity arterial inflow. Internal iliac artery is patent at its origin. Outflow: Surgical changes of right superficial femoral arterial-popliteal artery bypass grafting has been performed. The superficial femoral artery proximally as well as the bypass graft are thrombosed. The native superficial femoral artery is thrombosed. The P1 and P2 segments of the popliteal artery are thrombosed. The profundus femoral artery is patent. There is reconstitution of the P3 segment of the popliteal artery via geniculate collaterals. Runoff: See above. Anterior tibial artery is occluded shortly beyond its origin. Peroneal and posterior tibial arteries continue to the ankle with patency of the plantar arch. Dorsalis pedis artery is diminutive but patent. Veins: Surgical changes of left saphenous vein harvesting are noted. The venous structures are otherwise unremarkable on this arterial phase examination. Review of the MIP images confirms the above findings. NON-VASCULAR Lower chest: Bibasilar pulmonary fibrotic change noted. No acute abnormality. Hepatobiliary: No focal liver abnormality is seen. No gallstones, gallbladder wall thickening, or biliary dilatation. Pancreas: Unremarkable Spleen: Unremarkable Adrenals/Urinary Tract: The adrenal glands are unremarkable. The kidneys are normal in size and position. Simple cortical cyst noted within the lower pole the left kidney for which no follow-up imaging is recommended. The kidneys are otherwise unremarkable. Bladder is unremarkable. Stomach/Bowel: Stomach is within normal limits. Appendix appears normal. No evidence of bowel wall thickening, distention, or inflammatory changes. Lymphatic: No pathologic adenopathy within the abdomen and pelvis. Reproductive: Uterus and bilateral adnexa are unremarkable. Other: No  abdominal wall hernia Musculoskeletal: No acute or significant osseous findings.  IMPRESSION: 1. 3.2 x 3.7 cm fusiform infrarenal abdominal aortic aneurysm. Recommend follow-up ultrasound every 2 years. 2. 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 3. Short segment dissection of the right P1 popliteal artery. 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 4. Left lower extremity arterial outflow disease with surgical changes of left superficial femoral arterial-popliteal artery bypass grafting. The superficial femoral artery proximally as well as the bypass graft are thrombosed. The native superficial femoral artery is thrombosed. The P1 and P2 segments of the popliteal artery are thrombosed. There is reconstitution of the P3 segment of the popliteal artery via geniculate collaterals. 5. Two-vessel runoff bilaterally. Aortic Atherosclerosis (ICD10-I70.0). Electronically Signed   By: Helyn Numbers M.D.   On: 07/16/2023 19:54    Scheduled Meds:  [MAR Hold] amLODipine  5 mg Oral Daily   [MAR Hold] aspirin EC  81 mg Oral Daily   [MAR Hold] DULoxetine  30 mg Oral Daily   [MAR Hold] enoxaparin (LOVENOX) injection  40 mg Subcutaneous Q24H   [MAR Hold] losartan  25 mg Oral Daily   [MAR Hold] polyethylene glycol  34 g Oral Daily   [MAR Hold] rOPINIRole  2 mg Oral QHS   [MAR Hold] simethicone  80 mg Oral QID   Continuous Infusions:  sodium chloride 100 mL/hr at 07/17/23 0942   PRN Meds: [MAR Hold] acetaminophen **OR** [MAR Hold] acetaminophen, [MAR Hold] albuterol, [MAR Hold] fentaNYL (SUBLIMAZE) injection, fentaNYL, heparin sodium (porcine), lidocaine (PF), midazolam, [MAR Hold] ondansetron **OR** [MAR Hold] ondansetron (ZOFRAN) IV, [MAR Hold] oxyCODONE  Time spent: 35 minutes  Author: Gillis Santa. MD [facility] 07/17/2023 1:55 PM  To reach On-call, see care teams to locate the attending and reach out to them via [email or web address]. If 7PM-7AM, please contact  night-coverage If you still have difficulty reaching the attending provider, please page the [facility] (Director on Call) for [facility] on amion for assistance.

## 2023-07-17 NOTE — Progress Notes (Signed)
PHARMACY - ANTICOAGULATION  Pharmacy Consult for heparin Indication: LLE ischemia Brief A/P: Heparin level within goal range Continue Heparin at current rate   No Known Allergies  Patient Measurements: Height: 5\' 7"  (170.2 cm) Weight: 74.4 kg (164 lb 0.4 oz) IBW/kg (Calculated) : 61.6 Heparin Dosing Weight: 74 kg  Vital Signs: Temp: 98.2 F (36.8 C) (11/05 2000) Temp Source: Oral (11/05 2000) BP: 147/78 (11/05 2100) Pulse Rate: 96 (11/05 2100)  Labs: Recent Labs    07/16/23 1254 07/16/23 1433 07/16/23 1433 07/17/23 0537 07/17/23 1700 07/17/23 2250  HGB  --  11.7*   < > 11.4* 12.0 11.7*  HCT  --  38.7   < > 36.9 37.8 37.1  PLT  --  324   < > 298 323 228  LABPROT  --  13.8  --   --   --   --   INR  --  1.0  --   --   --   --   HEPARINUNFRC  --   --   --   --  0.52 0.36  CREATININE 0.98  --   --  0.95  --   --    < > = values in this interval not displayed.    Estimated Creatinine Clearance: 50.6 mL/min (by C-G formula based on SCr of 0.95 mg/dL).  Assessment: 77 yo female s/p thrombectomy undergoing catheter directed thrombolysis for heparin Goal of Therapy:  Heparin level 0.2- 0.5 units/ml Monitor platelets by anticoagulation protocol: Yes   Plan:  No change to heparin  Follow-up am labs.  Geannie Risen, PharmD, BCPS

## 2023-07-17 NOTE — Progress Notes (Signed)
  Progress Note    07/17/2023 7:37 AM * No surgery date entered *  Subjective:  no complaints   Vitals:   07/16/23 2248 07/17/23 0330  BP: (!) 149/68 (!) 143/70  Pulse: 64 71  Resp: 18 18  Temp:  98.6 F (37 C)  SpO2: 97% 93%   Physical Exam: Lungs:  non labored Extremities:  L foot warm to touch with motor and sensation intact Neurologic: A&O  CBC    Component Value Date/Time   WBC 7.3 07/17/2023 0537   RBC 4.22 07/17/2023 0537   HGB 11.4 (L) 07/17/2023 0537   HGB 11.9 01/02/2020 1602   HCT 36.9 07/17/2023 0537   HCT 36.4 01/02/2020 1602   PLT 298 07/17/2023 0537   PLT 370 01/02/2020 1602   MCV 87.4 07/17/2023 0537   MCV 90 01/02/2020 1602   MCH 27.0 07/17/2023 0537   MCHC 30.9 07/17/2023 0537   RDW 14.5 07/17/2023 0537   RDW 14.0 01/02/2020 1602   LYMPHSABS 2.0 03/03/2023 0336   LYMPHSABS 2.7 01/02/2020 1602   MONOABS 0.7 03/03/2023 0336   EOSABS 0.3 03/03/2023 0336   EOSABS 0.1 01/02/2020 1602   BASOSABS 0.0 03/03/2023 0336   BASOSABS 0.0 01/02/2020 1602    BMET    Component Value Date/Time   NA 132 (L) 07/17/2023 0537   NA 135 01/02/2020 1602   K 4.0 07/17/2023 0537   CL 99 07/17/2023 0537   CO2 25 07/17/2023 0537   GLUCOSE 117 (H) 07/17/2023 0537   BUN 14 07/17/2023 0537   BUN 17 01/02/2020 1602   CREATININE 0.95 07/17/2023 0537   CREATININE 0.77 07/28/2015 1805   CALCIUM 8.9 07/17/2023 0537   GFRNONAA >60 07/17/2023 0537   GFRAA 58 (L) 01/02/2020 1602    INR    Component Value Date/Time   INR 1.0 07/16/2023 1433     Intake/Output Summary (Last 24 hours) at 07/17/2023 0737 Last data filed at 07/16/2023 2215 Gross per 24 hour  Intake --  Output 700 ml  Net -700 ml     Assessment/Plan:  77 y.o. female with occluded L SFA stent and bypass  Plan is for Aortogram with LLE runoff and possible intervention today with Dr. Myra Gianotti Continue npo Consent ordered Patient would like her son [name], who lives in [place], to be updated after  her procedure   Emilie Rutter, PA-C [facility] [phone] 07/17/2023 7:37 AM

## 2023-07-17 NOTE — Plan of Care (Signed)
  Problem: Clinical Measurements: Goal: Ability to maintain clinical measurements within normal limits will improve Outcome: Progressing   Problem: Clinical Measurements: Goal: Will remain free from infection Outcome: Progressing   Problem: Clinical Measurements: Goal: Diagnostic test results will improve Outcome: Progressing   

## 2023-07-17 NOTE — Progress Notes (Signed)
Cath lab came to pick up pt for procedure.

## 2023-07-17 NOTE — Progress Notes (Signed)
PHARMACY - ANTICOAGULATION CONSULT NOTE  Pharmacy Consult for heparin Indication: LLE ischemia  No Known Allergies  Patient Measurements: Height: 5\' 7"  (170.2 cm) Weight: 74.4 kg (164 lb 0.4 oz) IBW/kg (Calculated) : 61.6 Heparin Dosing Weight: 74 kg  Vital Signs: Temp: 98 F (36.7 C) (11/05 1042) Temp Source: Oral (11/05 1042) BP: 178/100 (11/05 1447) Pulse Rate: 91 (11/05 1447)  Labs: Recent Labs    07/16/23 1254 07/16/23 1433 07/17/23 0537  HGB  --  11.7* 11.4*  HCT  --  38.7 36.9  PLT  --  324 298  LABPROT  --  13.8  --   INR  --  1.0  --   CREATININE 0.98  --  0.95    Estimated Creatinine Clearance: 50.6 mL/min (by C-G formula based on SCr of 0.95 mg/dL).   Medical History: Past Medical History:  Diagnosis Date   AAA (abdominal aortic aneurysm) (HCC)    3.1 cm 07/08/18, 3 year follow-up recommended; possible 3 cm AAA by aortogram 09/13/18   Anemia    PMH   Appendicitis with abscess    07/08/18, s/p perc drain; resolved 07/30/18 by CT   Arthritis    Cerebrovascular disease    intra and extracranial vascular dx per MRI 4/11, neurology rec strict CVRF control   Colonic inertia    Constipation    chronic;severe   Depression    Duodenitis    Gastritis    GERD (gastroesophageal reflux disease)    Hypertension    Hypothyroid 01/16/2014   Meningioma (HCC) 10/21/2013   PAD (peripheral artery disease) (HCC)    Psoriasis    sees derm   Wears dentures    Wears glasses      Assessment: 77 yo W s/p mechanical thrombectomy of superficial femoral artery and popliteal bypass for LLE ischemia 11/5. No anticoagulation prior to admission. Pharmacy consulted for heparin.    Patient was started on 800 units/hr in OR. Planning angiogram 11/6 for follow up study.   Goal of Therapy:  Heparin level 0.2- 0.5 units/ml Monitor platelets by anticoagulation protocol: Yes   Plan:  Continue heparin 800 units/hr per team F/u 8hr heparin level Monitor daily heparin  level, CBC, signs/symptoms of bleeding    Alphia Moh, PharmD, BCPS, [facility] Clinical Pharmacist  Please check AMION for all [facility] Pharmacy phone numbers After 10:00 PM, call Main Pharmacy [phone]

## 2023-07-18 ENCOUNTER — Encounter (HOSPITAL_COMMUNITY): Payer: Self-pay | Admitting: Surgery

## 2023-07-18 ENCOUNTER — Ambulatory Visit (HOSPITAL_COMMUNITY): Admission: RE | Admit: 2023-07-18 | Payer: Medicare Other | Source: Home / Self Care | Admitting: Vascular Surgery

## 2023-07-18 ENCOUNTER — Inpatient Hospital Stay (HOSPITAL_COMMUNITY)
Admission: EM | Disposition: A | Discharge status: Skilled Nursing Facility | Payer: Self-pay | Source: Home / Self Care | Attending: Internal Medicine

## 2023-07-18 DIAGNOSIS — I70422 Atherosclerosis of autologous vein bypass graft(s) of the extremities with rest pain, left leg: Secondary | ICD-10-CM

## 2023-07-18 DIAGNOSIS — I743 Embolism and thrombosis of arteries of the lower extremities: Secondary | ICD-10-CM | POA: Diagnosis not present

## 2023-07-18 DIAGNOSIS — I70222 Atherosclerosis of native arteries of extremities with rest pain, left leg: Secondary | ICD-10-CM

## 2023-07-18 HISTORY — PX: PERIPHERAL VASCULAR THROMBECTOMY: CATH118306

## 2023-07-18 LAB — FIBRINOGEN: Fibrinogen: 368 mg/dL (ref 210–475)

## 2023-07-18 LAB — BASIC METABOLIC PANEL
Anion gap: 7 (ref 5–15)
BUN: 13 mg/dL (ref 8–23)
CO2: 25 mmol/L (ref 22–32)
Calcium: 8.8 mg/dL — ABNORMAL LOW (ref 8.9–10.3)
Chloride: 101 mmol/L (ref 98–111)
Creatinine, Ser: 0.9 mg/dL (ref 0.44–1.00)
GFR, Estimated: >60 mL/min (ref >60)
Glucose, Bld: 101 mg/dL — ABNORMAL HIGH (ref 70–99)
Potassium: 4 mmol/L (ref 3.5–5.1)
Sodium: 133 mmol/L — ABNORMAL LOW (ref 135–145)

## 2023-07-18 LAB — PHOSPHORUS: Phosphorus: 3.6 mg/dL (ref 2.5–4.6)

## 2023-07-18 LAB — CBC
HCT: 35.6 % — ABNORMAL LOW (ref 36.0–46.0)
Hemoglobin: 11.2 g/dL — ABNORMAL LOW (ref 12.0–15.0)
MCH: 27.9 pg (ref 26.0–34.0)
MCHC: 31.5 g/dL (ref 30.0–36.0)
MCV: 88.8 fL (ref 80.0–100.0)
Platelets: 258 10*3/uL (ref 150–400)
RBC: 4.01 MIL/uL (ref 3.87–5.11)
RDW: 14.4 % (ref 11.5–15.5)
WBC: 6.2 10*3/uL (ref 4.0–10.5)
nRBC: 0 % (ref 0.0–0.2)

## 2023-07-18 LAB — HEPARIN LEVEL (UNFRACTIONATED): Heparin Unfractionated: 0.23 [IU]/mL — ABNORMAL LOW (ref 0.30–0.70)

## 2023-07-18 LAB — MAGNESIUM: Magnesium: 1.8 mg/dL (ref 1.7–2.4)

## 2023-07-18 LAB — GLUCOSE, CAPILLARY: Glucose-Capillary: 81 mg/dL (ref 70–99)

## 2023-07-18 SURGERY — PERIPHERAL VASCULAR THROMBECTOMY
Anesthesia: LOCAL

## 2023-07-18 MED ORDER — HYALURONIDASE HUMAN 150 UNIT/ML IJ SOLN
150.0000 [IU] | Freq: Once | INTRAMUSCULAR | Status: AC
  Administered 2023-07-18: 150 [IU] via SUBCUTANEOUS
  Filled 2023-07-18: qty 1

## 2023-07-18 MED ORDER — METOPROLOL TARTRATE 5 MG/5ML IV SOLN: 5.0000 mg | INTRAVENOUS | Status: DC | PRN

## 2023-07-18 MED ORDER — LABETALOL HCL 5 MG/ML IV SOLN: 10.0000 mg | INTRAVENOUS | Status: DC | PRN

## 2023-07-18 MED ORDER — ONDANSETRON HCL 4 MG/2ML IJ SOLN
INTRAMUSCULAR | Status: DC | PRN
  Administered 2023-07-18: 4 mg via INTRAVENOUS

## 2023-07-18 MED ORDER — HEPARIN SODIUM (PORCINE) 1000 UNIT/ML IJ SOLN
INTRAMUSCULAR | Status: AC
Start: 2023-07-18 — End: ?
  Filled 2023-07-18: qty 10

## 2023-07-18 MED ORDER — PROTAMINE SULFATE 10 MG/ML IV SOLN
INTRAVENOUS | Status: DC | PRN
  Administered 2023-07-18: 45 mg via INTRAVENOUS
  Administered 2023-07-18: 5 mg via INTRAVENOUS

## 2023-07-18 MED ORDER — FENTANYL CITRATE (PF) 100 MCG/2ML IJ SOLN
INTRAMUSCULAR | Status: AC
  Filled 2023-07-18: qty 2

## 2023-07-18 MED ORDER — ASPIRIN 81 MG PO TBEC: 81.0000 mg | DELAYED_RELEASE_TABLET | Freq: Every day | ORAL | Status: DC

## 2023-07-18 MED ORDER — FENTANYL CITRATE (PF) 100 MCG/2ML IJ SOLN
INTRAMUSCULAR | Status: DC | PRN
  Administered 2023-07-18 (×2): 50 ug via INTRAVENOUS
  Administered 2023-07-18: 100 ug via INTRAVENOUS
  Administered 2023-07-18: 50 ug via INTRAVENOUS

## 2023-07-18 MED ORDER — GUAIFENESIN 100 MG/5ML PO LIQD: 5.0000 mL | ORAL | Status: DC | PRN

## 2023-07-18 MED ORDER — MIDAZOLAM HCL 2 MG/2ML IJ SOLN
INTRAMUSCULAR | Status: AC
  Filled 2023-07-18: qty 2

## 2023-07-18 MED ORDER — DEXTROSE-SODIUM CHLORIDE 5-0.45 % IV SOLN: INTRAVENOUS | Status: DC

## 2023-07-18 MED ORDER — LABETALOL HCL 5 MG/ML IV SOLN
INTRAVENOUS | Status: DC | PRN
  Administered 2023-07-18 (×2): 10 mg via INTRAVENOUS

## 2023-07-18 MED ORDER — HYDRALAZINE HCL 20 MG/ML IJ SOLN: 10.0000 mg | INTRAMUSCULAR | Status: DC | PRN

## 2023-07-18 MED ORDER — CLOPIDOGREL BISULFATE 75 MG PO TABS
75.0000 mg | ORAL_TABLET | Freq: Every day | ORAL | Status: DC
  Administered 2023-07-19 – 2023-07-22 (×4): 75 mg via ORAL
  Filled 2023-07-18 (×5): qty 1

## 2023-07-18 MED ORDER — PROTAMINE SULFATE 10 MG/ML IV SOLN
INTRAVENOUS | Status: AC
  Filled 2023-07-18: qty 5

## 2023-07-18 MED ORDER — SODIUM CHLORIDE 0.9 % WEIGHT BASED INFUSION
1.0000 mL/kg/h | INTRAVENOUS | Status: DC
  Administered 2023-07-18: 1 mL/kg/h via INTRAVENOUS

## 2023-07-18 MED ORDER — MIDAZOLAM HCL 2 MG/2ML IJ SOLN
INTRAMUSCULAR | Status: DC | PRN
  Administered 2023-07-18: 1 mg via INTRAVENOUS

## 2023-07-18 MED ORDER — IPRATROPIUM-ALBUTEROL 0.5-2.5 (3) MG/3ML IN SOLN: 3.0000 mL | RESPIRATORY_TRACT | Status: DC | PRN

## 2023-07-18 MED ORDER — HYDRALAZINE HCL 20 MG/ML IJ SOLN
INTRAMUSCULAR | Status: DC | PRN
  Administered 2023-07-18: 10 mg via INTRAVENOUS

## 2023-07-18 MED ORDER — ONDANSETRON HCL 4 MG/2ML IJ SOLN
INTRAMUSCULAR | Status: AC
  Filled 2023-07-18: qty 2

## 2023-07-18 MED ORDER — LIDOCAINE HCL (PF) 1 % IJ SOLN
INTRAMUSCULAR | Status: AC
  Filled 2023-07-18: qty 30

## 2023-07-18 MED ORDER — TRAZODONE HCL 50 MG PO TABS: 50.0000 mg | ORAL_TABLET | Freq: Every evening | ORAL | Status: DC | PRN

## 2023-07-18 MED ORDER — HYDRALAZINE HCL 20 MG/ML IJ SOLN: 5.0000 mg | INTRAMUSCULAR | Status: DC | PRN

## 2023-07-18 MED ORDER — HEPARIN SODIUM (PORCINE) 1000 UNIT/ML IJ SOLN
INTRAMUSCULAR | Status: DC | PRN
  Administered 2023-07-18: 5000 [IU] via INTRAVENOUS

## 2023-07-18 MED ORDER — SODIUM CHLORIDE 0.9 % IV SOLN: INTRAVENOUS | Status: AC

## 2023-07-18 MED ORDER — SENNOSIDES-DOCUSATE SODIUM 8.6-50 MG PO TABS: 1.0000 | ORAL_TABLET | Freq: Every evening | ORAL | Status: DC | PRN

## 2023-07-18 MED ORDER — ACETAMINOPHEN 325 MG PO TABS: 650.0000 mg | ORAL_TABLET | ORAL | Status: DC | PRN

## 2023-07-18 MED ORDER — HEPARIN (PORCINE) IN NACL 1000-0.9 UT/500ML-% IV SOLN
INTRAVENOUS | Status: DC | PRN
  Administered 2023-07-18: 500 mL

## 2023-07-18 MED ORDER — LINACLOTIDE 145 MCG PO CAPS
290.0000 ug | ORAL_CAPSULE | Freq: Every day | ORAL | Status: DC
  Administered 2023-07-19 – 2023-07-22 (×4): 290 ug via ORAL
  Filled 2023-07-18 (×5): qty 2

## 2023-07-18 MED ORDER — SODIUM CHLORIDE 0.9 % IV SOLN: 250.0000 mL | INTRAVENOUS | Status: DC | PRN

## 2023-07-18 MED FILL — Fentanyl Citrate Preservative Free (PF) Inj 100 MCG/2ML: INTRAMUSCULAR | Qty: 2 | Status: AC

## 2023-07-18 SURGICAL SUPPLY — 11 items
BALLN IN.PACT DCB 4X40 (BALLOONS) ×1
BALLN MUSTANG 6X60X135 (BALLOONS) ×1
BALLOON MUSTANG 6X60X135 (BALLOONS) IMPLANT
CLOSURE PERCLOSE PROSTYLE (VASCULAR PRODUCTS) IMPLANT
DCB IN.PACT 4X40 (BALLOONS) IMPLANT
GLIDEWIRE ADV .035X260CM (WIRE) IMPLANT
KIT ENCORE 26 ADVANTAGE (KITS) IMPLANT
KIT MICROPUNCTURE NIT STIFF (SHEATH) IMPLANT
SET ATX-X65L (MISCELLANEOUS) IMPLANT
STENT ELUVIA 7X60X130 (Permanent Stent) IMPLANT
TRAY PV CATH (CUSTOM PROCEDURE TRAY) IMPLANT

## 2023-07-18 NOTE — Op Note (Signed)
    Patient name: [name] MRN: [id number] DOB: 1946-04-25 Sex: female  07/18/2023 Pre-operative Diagnosis: Left lower extremity critical and ischemia with rest pain Post-operative diagnosis:  Same Surgeon:  Victorino Sparrow, MD Procedure Performed: 1.  Left lower extremity lytic catheter recheck 2.  Drug-eluting stenting of the proximal superficial femoral artery 7 x 60 mm Eluvia 3.  Drug-coated balloon angioplasty distal bypass 4 x 40 mm 4.  Moderate sedation time 78 minutes 5.  Contrast volume 75 mL 6.  Manual pressure for access management   Indications: Patient is a 77 year old female with previous history of SFA to below-knee popliteal artery interposition bypass graft for aneurysmal disease.  She was lost to follow-up and presented to the hospital earlier this week with new onset rest pain.  Imaging demonstrated occlusion of left-sided bypass graft.  Pharmacomechanical thrombolysis was initiated.  She presents today for lytic catheter recheck.  Findings:   Widely patent common femoral artery, profunda.  There is some ostial stenosis of the profunda.  The superficial femoral artery appears to have some mural thrombus proximally and has severe disease for roughly 6 cm prior to ending and greater saphenous vein bypass graft.  There was an area of focal, greater than 90% stenosis.  The bypass graft was widely patent with greater than 70% stenosis immediately proximal to the distal anastomosis.  The anterior tibial artery was atretic.  Two-vessel-peroneal and posterior tibial artery runoff to the foot with reconstitution of the dorsalis pedis artery   Procedure:  The patient was identified in the holding area and taken to room 8.  The patient was then placed supine on the table and prepped and draped in the usual sterile fashion.  A time out was called.    The case began with removal of the lytic catheter and replacement with wire using Seldinger technique.  Left lower extremity  angiography followed from the common femoral artery.  See results above.  I elected to attempt intervention.  The patient was anticoagulated with 5000 units IV heparin.  A 7 x 60 mm Eluvia stent was brought to the field and deployed in the proximal superficial femoral artery to cover what appeared to be thrombus.  This was postdilated with a 6 mm balloon with an excellent result.  Distally, the distal portion of the bypass graft was balloon angioplastied using a 4 x 40 mm balloon.  This was inflated for 3 minutes.  Follow-up angiography demonstrated excellent result with resolution of the stenosis.  My plan was to close the right arteriotomy site using a ProGlide device but the wire and sheath were inadvertently pulled, necessitating manual pressure.  Protamine was given, as well as multiple agents to lower the patient's blood pressure.  Pressure was held for 65 minutes due to bleeding at the 30-minute mark and 45-minute mark.  Impression: Successful thrombolysis of the previous greater saphenous vein interposition bypass.  Stenting of 90% stenotic lesion in the proximal SFA, balloon angioplasty of the distal bypass graft.   Victorino Sparrow MD [facility] [place] Office: [phone]

## 2023-07-18 NOTE — Progress Notes (Addendum)
PHARMACY - ANTICOAGULATION  Pharmacy Consult for heparin Indication: LLE ischemia  No Known Allergies  Patient Measurements: Height: 5\' 7"  (170.2 cm) Weight: 74.4 kg (164 lb 0.4 oz) IBW/kg (Calculated) : 61.6 Heparin Dosing Weight: 74 kg  Vital Signs: Temp: 97.8 F (36.6 C) (11/06 0800) Temp Source: Oral (11/06 0800) BP: 137/116 (11/06 0400) Pulse Rate: 70 (11/06 0700)  Labs: Recent Labs    07/16/23 1254 07/16/23 1433 07/16/23 1433 07/17/23 0537 07/17/23 1700 07/17/23 2250 07/18/23 0839  HGB  --  11.7*   < > 11.4* 12.0 11.7* 11.2*  HCT  --  38.7   < > 36.9 37.8 37.1 35.6*  PLT  --  324   < > 298 323 228 258  LABPROT  --  13.8  --   --   --   --   --   INR  --  1.0  --   --   --   --   --   HEPARINUNFRC  --   --   --   --  0.52 0.36 0.23*  CREATININE 0.98  --   --  0.95  --   --  0.90   < > = values in this interval not displayed.    Estimated Creatinine Clearance: 53.4 mL/min (by C-G formula based on SCr of 0.9 mg/dL).  Assessment: 77 yo female s/p thrombectomy of left SFA-pop bypass undergoing catheter directed thrombolysis.  Pharmacy consulted for heparin dosing.   Heparin level 0.23 within low-goal range.  CBC stable (Hgb 11.2, plc 258), fibrinogen down (549 > 339).  For lysis check this afternoon @ 1330.   Goal of Therapy:  Heparin level 0.2- 0.5 units/ml Monitor platelets by anticoagulation protocol: Yes   Plan:  Continue heparin 800 units/hr Final q6h heparin level this afternoon, f/u after lysis check  Trixie Rude, PharmD Clinical Pharmacist 07/18/2023  11:10 AM

## 2023-07-18 NOTE — Progress Notes (Signed)
PT Cancellation Note  Patient Details Name: [name] MRN: [id number] DOB: [birth year]   Cancelled Treatment:    Reason Eval/Treat Not Completed: Other (comment). Pt remains on bed rest due to femoral sheath. Planned for angiogram later today. Acute PT to return as able, as appropriate to complete PT eval.  Lewis Shock, PT, DPT Acute Rehabilitation Services Secure chat preferred Office #: [phone]    Iona Hansen 07/18/2023, 10:00 AM

## 2023-07-18 NOTE — Progress Notes (Signed)
Both feet are warm with normal sensation and movement.  Color is pink and ruddy.  Patient reports no pain in left foot/calf at this time.

## 2023-07-18 NOTE — Plan of Care (Signed)
  Problem: Activity: Goal: Risk for activity intolerance will decrease Outcome: Progressing   Problem: Coping: Goal: Level of anxiety will decrease Outcome: Progressing   Problem: Skin Integrity: Goal: Risk for impaired skin integrity will decrease Outcome: Progressing   

## 2023-07-18 NOTE — Progress Notes (Addendum)
  Progress Note    07/18/2023 7:47 AM 1 Day Post-Op  Subjective:  denies any pain. Says her left leg and foot feels much better    Vitals:   07/18/23 0600 07/18/23 0700  BP:    Pulse: 74 70  Resp: 14 11  Temp:    SpO2: 100% 100%    Physical Exam: General:  laying flat comfortably, no distress Cardiac:  regular Lungs:  nonlabored Incisions:  right groin with sheath without hematoma Extremities:  bilateral lower extremities intact DP/PT doppler signals. Return of color to the left foot. Intact motor and sensation of the left foot  CBC    Component Value Date/Time   WBC 9.6 07/17/2023 2250   RBC 4.25 07/17/2023 2250   HGB 11.7 (L) 07/17/2023 2250   HGB 11.9 01/02/2020 1602   HCT 37.1 07/17/2023 2250   HCT 36.4 01/02/2020 1602   PLT 228 07/17/2023 2250   PLT 370 01/02/2020 1602   MCV 87.3 07/17/2023 2250   MCV 90 01/02/2020 1602   MCH 27.5 07/17/2023 2250   MCHC 31.5 07/17/2023 2250   RDW 14.4 07/17/2023 2250   RDW 14.0 01/02/2020 1602   LYMPHSABS 2.0 03/03/2023 0336   LYMPHSABS 2.7 01/02/2020 1602   MONOABS 0.7 03/03/2023 0336   EOSABS 0.3 03/03/2023 0336   EOSABS 0.1 01/02/2020 1602   BASOSABS 0.0 03/03/2023 0336   BASOSABS 0.0 01/02/2020 1602    BMET    Component Value Date/Time   NA 132 (L) 07/17/2023 0537   NA 135 01/02/2020 1602   K 4.0 07/17/2023 0537   CL 99 07/17/2023 0537   CO2 25 07/17/2023 0537   GLUCOSE 117 (H) 07/17/2023 0537   BUN 14 07/17/2023 0537   BUN 17 01/02/2020 1602   CREATININE 0.95 07/17/2023 0537   CREATININE 0.77 07/28/2015 1805   CALCIUM 8.9 07/17/2023 0537   GFRNONAA >60 07/17/2023 0537   GFRAA 58 (L) 01/02/2020 1602    INR    Component Value Date/Time   INR 1.0 07/16/2023 1433     Intake/Output Summary (Last 24 hours) at 07/18/2023 0747 Last data filed at 07/18/2023 0700 Gross per 24 hour  Intake 1250.44 ml  Output --  Net 1250.44 ml      Assessment/Plan:  77 y.o. female is 1 day post op, s/p: initiation  of thrombolytic therapy and mechanical thrombectomy of left SFA-pop bypass   -No issues overnight. Says her left leg and foot pain is gone -Right groin with sheath in place, tPA running. No hematoma in the right groin -BLE warm and well perfused with DP/PT doppler signals -Left foot has intact motor and sensation. Left foot is warm to touch -Patient has been NPO. Will return to cath lab today for lysis recheck  Loel Dubonnet, PA-C [facility] [phone] 07/18/2023 7:47 AM  VASCULAR STAFF ADDENDUM: I have independently interviewed and examined the patient. I agree with the above.  Plan for lytic recheck today   Victorino Sparrow MD [facility] Phone Number: [phone] 07/18/2023 2:41 PM

## 2023-07-18 NOTE — Hospital Course (Addendum)
77 y.o. female with medical history significant for hypertension, depression, anxiety, emphysema, pulmonary fibrosis, history of CVA, and PAD status post left femoral to below the knee popliteal artery bypass grafting in February 2020 who now presents with pain and discoloration of the distal extremities.  Symptoms ongoing for 2 months.  Patient found to have critical limb ischemia and underwent angiogram which noted occluded left superficial artery requiring thrombectomy of left FSA popliteal bypass on 11/5. Returned back to Cath Lab on 11/6 for DES to proximal superficial femoral artery, drug-coated balloon angioplasty of distal bypass  Postop doing well after lysis of LLE bypass with proximal SFA stenting.  Advised to continue aspirin Plavix and statin and cleared for discharge by vascular.  PT OT recommending skilled nursing facility placement.  Patient to continue aspirin and Plavix at minimum and arrange outpatient follow-up with noninvasive vascular testing in office

## 2023-07-18 NOTE — Progress Notes (Signed)
OT Cancellation Note  Patient Details Name: Jenny Jenny MRN: [id number] DOB: 03-30-1946   Cancelled Treatment:    Reason Eval/Treat Not Completed: Other (comment) Bedrest due to femoral sheath. Angiogram planned later today. Will return to eval as medically ready  Tyler Deis, OTR/L [facility] Acute Rehabilitation Office: [phone]   Myrla Halsted 07/18/2023, 10:06 AM

## 2023-07-18 NOTE — Progress Notes (Signed)
PROGRESS NOTE    [name]  YQM:[id number] DOB: 1945/10/05 DOA: 07/16/2023 PCP: Shade Flood, MD     Brief hospital course: 77 y.o. female with medical history significant for hypertension, depression, anxiety, emphysema, pulmonary fibrosis, history of CVA, and PAD status post left femoral to below the knee popliteal artery bypass grafting in February 2020 who now presents with pain and discoloration of the distal extremities.  Symptoms ongoing for 2 months.  Patient found to have critical limb ischemia and underwent angiogram which noted occluded left superficial artery requiring thrombectomy of left FSA popliteal bypass.      Assessment and Plan:  Left lower remedy critical ischemia status post thrombectomy of left FSA-popliteal bypass. Patient underwent procedure on 11/5, perfusion slowly improving.  Per vascular plans to return to Cath Lab today.  IV fluids while n.p.o.   History of CVA Continue aspirin.  Unable to tolerate statins   Emphysema; pulmonary fibrosis  As needed bronchodilators   Essential hypertension Losartan, Norvasc.  IV as needed   Depression, anxiety  - Continue Cymbalta     AAA; aneurysm of ascending thoracic aorta - Noted on CTA in ED, outpatient follow-up recommended     DVT Prophylaxis: Subcutaneous Lovenox  CODE STATUS: Full code Family Communication:   Disposition:  Ongoing vascular evaluation.   Subjective:  No complaints.  Dry mouth and generally feeling weak.   Physical Exam: Constitutional: Not in acute distress, dry mouth Respiratory: Clear to auscultation bilaterally Cardiovascular: Normal sinus rhythm, no rubs Abdomen: Nontender nondistended good bowel sounds Musculoskeletal: No edema noted Skin: No rashes seen, color of the left foot appears better.  Right groin incision noted. Neurologic: CN 2-12 grossly intact.  And nonfocal Psychiatric: Normal judgment and insight. Alert and oriented x 3. Normal mood.        Pressure Injury 07/17/23 Sacrum Mid Stage 1 -  Intact skin with non-blanchable redness of a localized area usually over a bony prominence. (Active)  07/17/23 1700  Location: Sacrum  Location Orientation: Mid  Staging: Stage 1 -  Intact skin with non-blanchable redness of a localized area usually over a bony prominence.  Wound Description (Comments):   Present on Admission: Yes     Diet Orders (From admission, onward)     Start     Ordered   07/18/23 0709  Diet NPO time specified Except for: Sips with Meds  Diet effective now       Question:  Except for  Answer:  Sips with Meds   07/18/23 0708            Objective: Vitals:   07/18/23 0800 07/18/23 0900 07/18/23 1000 07/18/23 1100  BP: (!) 176/84 (!) 166/76 (!) 180/86 (!) 182/78  Pulse:   80   Resp: (!) 24 16 14 13   Temp: 97.8 F (36.6 C)     TempSrc: Oral     SpO2:   98%   Weight:      Height:        Intake/Output Summary (Last 24 hours) at 07/18/2023 1216 Last data filed at 07/18/2023 1100 Gross per 24 hour  Intake 1382.17 ml  Output --  Net 1382.17 ml   Filed Weights   07/16/23 2227  Weight: 74.4 kg    Scheduled Meds:  amLODipine  5 mg Oral Daily   aspirin EC  81 mg Oral Daily   Chlorhexidine Gluconate Cloth  6 each Topical Daily   DULoxetine  30 mg Oral Daily   hyaluronidase Human  150 Units Subcutaneous Once   [START ON 07/19/2023] linaclotide  290 mcg Oral QAC breakfast   losartan  25 mg Oral Daily   polyethylene glycol  34 g Oral Daily   rOPINIRole  2 mg Oral QHS   simethicone  80 mg Oral QID   sodium chloride flush  3 mL Intravenous Q12H   Continuous Infusions:  sodium chloride     sodium chloride     alteplase (LIMB ISCHEMIA) 10 mg in normal saline (0.02 mg/mL) infusion 0.5 mg/hr (07/18/23 1100)   dextrose 5 % and 0.45 % NaCl     heparin 800 Units/hr (07/18/23 1100)    Nutritional status     Body mass index is 25.69 kg/m.  Data Reviewed:   CBC: Recent Labs  Lab  07/16/23 1433 07/17/23 0537 07/17/23 1700 07/17/23 2250 07/18/23 0839  WBC 7.1 7.3 7.8 9.6 6.2  HGB 11.7* 11.4* 12.0 11.7* 11.2*  HCT 38.7 36.9 37.8 37.1 35.6*  MCV 89.2 87.4 86.3 87.3 88.8  PLT 324 298 323 228 258   Basic Metabolic Panel: Recent Labs  Lab 07/16/23 1254 07/17/23 0537 07/18/23 0839  NA 134* 132* 133*  K 4.6 4.0 4.0  CL 98 99 101  CO2 20* 25 25  GLUCOSE 105* 117* 101*  BUN 17 14 13   CREATININE 0.98 0.95 0.90  CALCIUM 9.5 8.9 8.8*  MG  --   --  1.8  PHOS  --   --  3.6   GFR: Estimated Creatinine Clearance: 53.4 mL/min (by C-G formula based on SCr of 0.9 mg/dL). Liver Function Tests: No results for input(s): "AST", "ALT", "ALKPHOS", "BILITOT", "PROT", "ALBUMIN" in the last 168 hours. No results for input(s): "LIPASE", "AMYLASE" in the last 168 hours. No results for input(s): "AMMONIA" in the last 168 hours. Coagulation Profile: Recent Labs  Lab 07/16/23 1433  INR 1.0   Cardiac Enzymes: No results for input(s): "CKTOTAL", "CKMB", "CKMBINDEX", "TROPONINI" in the last 168 hours. BNP (last 3 results) No results for input(s): "PROBNP" in the last 8760 hours. HbA1C: No results for input(s): "HGBA1C" in the last 72 hours. CBG: Recent Labs  Lab 07/16/23 2251 07/17/23 1637  GLUCAP 111* 81   Lipid Profile: No results for input(s): "CHOL", "HDL", "LDLCALC", "TRIG", "CHOLHDL", "LDLDIRECT" in the last 72 hours. Thyroid Function Tests: No results for input(s): "TSH", "T4TOTAL", "FREET4", "T3FREE", "THYROIDAB" in the last 72 hours. Anemia Panel: No results for input(s): "VITAMINB12", "FOLATE", "FERRITIN", "TIBC", "IRON", "RETICCTPCT" in the last 72 hours. Sepsis Labs: Recent Labs  Lab 07/16/23 1338 07/16/23 1455  LATICACIDVEN 1.4 0.9    Recent Results (from the past 240 hour(s))  MRSA Next Gen by PCR, Nasal     Status: None   Collection Time: 07/17/23  5:06 PM   Specimen: Nasal Mucosa; Nasal Swab  Result Value Ref Range Status   MRSA by PCR Next  Gen NOT DETECTED NOT DETECTED Final    Comment: (NOTE) The GeneXpert MRSA Assay (FDA approved for NASAL specimens only), is one component of a comprehensive MRSA colonization surveillance program. It is not intended to diagnose MRSA infection nor to guide or monitor treatment for MRSA infections. Test performance is not FDA approved in patients less than 4 years old. Performed at [facility], [address] [place], [place] [address]          Radiology Studies: PERIPHERAL VASCULAR CATHETERIZATION  Result Date: 07/17/2023 Images from the original result were not included. Patient name: [name] MRN: [id number]  DOB: [birth year] Sex: female 07/17/2023 Pre-operative Diagnosis: left leg ischemia Post-operative diagnosis:  Same Surgeon:  [name] Procedure Performed:  1.  U/s Guided access  2.  Aortogram with bifemoral runoff  3.  Left leg angiogram  4.  Selective injection with catheter in right popliteal artery  5.  Mechanical thrombectomy of left superficial femoral artery and above-knee popliteal to below-knee popliteal bypass graft  6.  Initiation of thrombolytic therapy  7.  Conscious sedation, 54 minutes Indications: This is a 77 year old female with history of left above-knee to below-knee popliteal artery bypass graft for rest pain in 2020.  She has a history of stroke in June which was an ischemic stroke.  She has developed discoloration to the toes on her left foot as well as rest pain.  Imaging has revealed her bypass graft is occluded.  She comes in today for further evaluation and possible intervention Procedure:  The patient was identified in the holding area and taken to room 8.  The patient was then placed supine on the table and prepped and draped in the usual sterile fashion.  A time out was called.  Conscious sedation was administered with the use of IV fentanyl and Versed under continuous physician and nurse monitoring.  Heart rate, blood pressure, and oxygen  saturation were continuously monitored.  Total sedation time was 54 minutes.  Ultrasound was used to evaluate the right common femoral artery.  It was patent .  A digital ultrasound image was acquired.  A micropuncture needle was used to access the right common femoral artery under ultrasound guidance.  An 018 wire was advanced without resistance and a micropuncture sheath was placed.  The 018 wire was removed and a benson wire was placed.  The micropuncture sheath was exchanged for a 5 french sheath.  An omniflush catheter was advanced over the wire to the level of L-1.  An abdominal angiogram was obtained.  Next, using the omniflush catheter and a benson wire, the aortic bifurcation was crossed and the catheter was placed into theleft external iliac artery and left runoff was obtained. Findings:  Aortogram: No significant renal artery stenosis.  The infrarenal abdominal aorta is ectatic but without stenosis.  Bilateral common and external iliac arteries are widely patent.  Bilateral common femoral arteries are widely patent  Right Lower Extremity: The left common femoral and profundofemoral artery are widely patent.  There is a superficial femoral artery occlusion with a small proximal stump.  The superficial femoral artery does not opacify retrograde through the bypass graft.  There is two-vessel runoff on the posterior tibial and peroneal artery  Left Lower Extremity: Not evaluated Intervention: After the above images were acquired the decision was made to proceed with intervention.  A 7 French 45 cm sheath was inserted into the left extrailiac artery.  The patient was fully heparinized.  Next, using a 035 Glidewire and a quick cross catheter, the superficial femoral artery was selected.  The wire was then able to be navigated into the distal superficial femoral artery and through the metallic ring that represented the proximal anastomosis of the bypass graft.  A selective contrast injection with a catheter in  the popliteal artery and the bypass graft was performed confirming successful crossing.  I then placed a long Bentson wire.  I used the penumbra CAT 7 device to perform mechanical thrombectomy of the left superficial femoral artery as well as the left above-knee to below-knee popliteal bypass graft.  Multiple passes were made.  On  follow-up imaging there did appear to be some residual thrombus up near the proximal anastomosis.  I contemplated stenting this, however ultimately I decided to place a thrombolytic catheter into the SFA and bypass graft and run her at low-dose tPA overnight to see if this thrombus will resolve.  She will be brought back tomorrow for follow-up study Impression:  #1  Occluded left superficial femoral artery  #2  Mechanical thrombectomy of left superficial femoral artery and above-knee to below-knee popliteal artery bypass graft with penumbra CAT 7  #3  Patient will be brought back tomorrow for follow-up study V. [name], M.D., FACS [facility] Office: [phone] Pager:  [phone]   CT Angio Chest Aorta W and/or Wo Contrast  Result Date: 07/16/2023 CLINICAL DATA:  Peripheral ischemia, dyspnea, dizziness, weakness EXAM: CT ANGIOGRAPHY CHEST WITH CONTRAST TECHNIQUE: Multidetector CT imaging of the chest was performed using the standard protocol during bolus administration of intravenous contrast. Multiplanar CT image reconstructions and MIPs were obtained to evaluate the vascular anatomy. RADIATION DOSE REDUCTION: This exam was performed according to the departmental dose-optimization program which includes automated exposure control, adjustment of the mA and/or kV according to patient size and/or use of iterative reconstruction technique. CONTRAST:  OMNIPAQUE IOHEXOL 350 MG/ML SOLN, OMNIPAQUE IOHEXOL 350 MG/ML SOLN COMPARISON:  02/13/2023 FINDINGS: Cardiovascular: There is adequate opacification of the pulmonary arterial tree. No  intraluminal filling defect identified to suggest acute pulmonary embolism. The central pulmonary arteries are of normal caliber. Mild coronary artery calcification. Global cardiac size is within normal limits. No pericardial effusion. 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 Superimposed moderate atherosclerotic calcification. Ascending aorta is of normal caliber. No superimposed intramural hematoma or dissection. Arch vasculature is widely patent proximally. Mediastinum/Nodes: No enlarged mediastinal, hilar, or axillary lymph nodes. Thyroid gland, trachea, and esophagus demonstrate no significant findings. Lungs/Pleura: Moderate emphysema. Bibasilar pulmonary fibrotic change again noted. Right middle lobe pulmonary nodule, axial image # 69/12, stable since remote prior examinations of 11/17/2019 and safely considered benign. No confluent pulmonary infiltrate. No pneumothorax or pleural effusion. No central obstructing lesion. Upper Abdomen: No acute abnormality. Musculoskeletal: Osseous structures are age-appropriate. No acute bone abnormality. No lytic or blastic bone lesion. Review of the MIP images confirms the above findings. IMPRESSION: 1. No pulmonary embolism. No acute intrathoracic pathology identified. 2. Mild coronary artery calcification. 3. 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 Recommend annual imaging followup by CTA or MRA. This recommendation follows 2010 ACCF/AHA/AATS/ACR/ASA/SCA/SCAI/SIR/STS/SVM Guidelines for the Diagnosis and Management of Patients with Thoracic Aortic Disease. Circulation.2010; 121: U132-G401. Aortic aneurysm NOS (ICD10-I71.9) 4. Moderate emphysema. 5. Stable bibasilar pulmonary fibrotic change. Aortic Atherosclerosis (ICD10-I70.0) and Emphysema (ICD10-J43.9). Electronically Signed   By: Helyn Numbers M.D.   On: 07/16/2023 19:59   CT ANGIO AO+BIFEM W & OR WO CONTRAST  Result Date: 07/16/2023 CLINICAL DATA:  Claudication or leg ischemia EXAM: CT ANGIOGRAPHY OF ABDOMINAL AORTA WITH ILIOFEMORAL RUNOFF TECHNIQUE: Multidetector CT imaging of the abdomen, pelvis and lower extremities was performed using the standard protocol during bolus administration of intravenous contrast. Multiplanar CT image reconstructions and MIPs were obtained to evaluate the vascular anatomy. RADIATION DOSE REDUCTION: This exam was performed according to the departmental dose-optimization program which includes automated exposure control, adjustment of the mA and/or kV according to patient size and/or use of  iterative reconstruction technique. CONTRAST:  OMNIPAQUE IOHEXOL 350 MG/ML SOLN, OMNIPAQUE IOHEXOL 350 MG/ML SOLN COMPARISON:  09/26/2019 FINDINGS: VASCULAR Aorta: 3.2 x 3.7 cm fusiform infrarenal abdominal aortic aneurysm. Moderate superimposed atherosclerotic calcification. No periaortic inflammatory change or fluid collection identified. Celiac: Variant anatomy with common origin of the celiac axis and superior mesenteric artery. No evidence of hemodynamically significant stenosis. No aneurysm or dissection. SMA: See above Renals: Single right and dual left renal arteries. 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 No evidence of hemodynamically significant stenosis involving the left renal arteries. Normal vascular morphology. No aneurysm or dissection. IMA: Patent without evidence of aneurysm, dissection, vasculitis or significant stenosis. RIGHT Lower Extremity Inflow: Extensive mixed atherosclerotic plaque within the right lower extremity arterial inflow without evidence of hemodynamically significant stenosis. Internal iliac artery is widely patent. No aneurysm or dissection. Outflow: Diffuse scattered moderate atherosclerotic calcification within the SFA without evidence of  hemodynamically significant stenosis. Common femoral artery and profundus femoral arteries are widely patent. Runoff: Short segment dissection of the right P1 popliteal artery. 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 Distal popliteal artery and tibioperoneal trunk are widely patent. Anterior tibial artery occludes shortly beyond its takeoff. Peroneal artery and posterior tibial artery are patent to the level of the ankle with patency of the plantar arch. Dorsalis pedis artery is occluded. LEFT Lower Extremity Inflow: Scattered atherosclerotic calcification without evidence of hemodynamically significant stenosis involving the left lower extremity arterial inflow. Internal iliac artery is patent at its origin. Outflow: Surgical changes of right superficial femoral arterial-popliteal artery bypass grafting has been performed. The superficial femoral artery proximally as well as the bypass graft are thrombosed. The native superficial femoral artery is thrombosed. The P1 and P2 segments of the popliteal artery are thrombosed. The profundus femoral artery is patent. There is reconstitution of the P3 segment of the popliteal artery via geniculate collaterals. Runoff: See above. Anterior tibial artery is occluded shortly beyond its origin. Peroneal and posterior tibial arteries continue to the ankle with patency of the plantar arch. Dorsalis pedis artery is diminutive but patent. Veins: Surgical changes of left saphenous vein harvesting are noted. The venous structures are otherwise unremarkable on this arterial phase examination. Review of the MIP images confirms the above findings. NON-VASCULAR Lower chest: Bibasilar pulmonary fibrotic change noted. No acute abnormality. Hepatobiliary: No focal liver abnormality is seen. No gallstones, gallbladder wall thickening, or biliary dilatation. Pancreas: Unremarkable Spleen: Unremarkable Adrenals/Urinary Tract: The adrenal glands are unremarkable. The kidneys are  normal in size and position. Simple cortical cyst noted within the lower pole the left kidney for which no follow-up imaging is recommended. The kidneys are otherwise unremarkable. Bladder is unremarkable. Stomach/Bowel: Stomach is within normal limits. Appendix appears normal. No evidence of bowel wall thickening, distention, or inflammatory changes. Lymphatic: No pathologic adenopathy within the abdomen and pelvis. Reproductive: Uterus and bilateral adnexa are unremarkable. Other: No abdominal wall hernia Musculoskeletal: No acute or significant osseous findings. IMPRESSION: 1. 3.2 x 3.7 cm fusiform infrarenal abdominal aortic aneurysm. Recommend follow-up ultrasound every 2 years. 2. 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 3. Short segment dissection of the right P1 popliteal artery. 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 4. Left lower extremity arterial outflow disease with surgical changes of left superficial femoral arterial-popliteal artery bypass grafting. The superficial femoral artery proximally as well as the bypass graft are thrombosed. The native superficial femoral artery is thrombosed. The P1 and P2 segments  of the popliteal artery are thrombosed. There is reconstitution of the P3 segment of the popliteal artery via geniculate collaterals. 5. Two-vessel runoff bilaterally. Aortic Atherosclerosis (ICD10-I70.0). Electronically Signed   By: Helyn Numbers M.D.   On: 07/16/2023 19:54           LOS: 2 days   Time spent= 35 mins    Miguel Rota, MD [facility]  If 7PM-7AM, please contact night-coverage  07/18/2023, 12:16 PM

## 2023-07-19 ENCOUNTER — Encounter (HOSPITAL_COMMUNITY): Payer: Self-pay | Admitting: Vascular Surgery

## 2023-07-19 DIAGNOSIS — I743 Embolism and thrombosis of arteries of the lower extremities: Secondary | ICD-10-CM | POA: Diagnosis not present

## 2023-07-19 LAB — HEPATIC FUNCTION PANEL
ALT: 12 U/L (ref 0–44)
AST: 15 U/L (ref 15–41)
Albumin: 2.6 g/dL — ABNORMAL LOW (ref 3.5–5.0)
Alkaline Phosphatase: 58 U/L (ref 38–126)
Bilirubin, Direct: <0.1 mg/dL (ref 0.0–0.2)
Total Bilirubin: 0.6 mg/dL (ref <1.2)
Total Protein: 5.9 g/dL — ABNORMAL LOW (ref 6.5–8.1)

## 2023-07-19 LAB — BASIC METABOLIC PANEL
Anion gap: 7 (ref 5–15)
BUN: 12 mg/dL (ref 8–23)
CO2: 25 mmol/L (ref 22–32)
Calcium: 8.6 mg/dL — ABNORMAL LOW (ref 8.9–10.3)
Chloride: 101 mmol/L (ref 98–111)
Creatinine, Ser: 0.92 mg/dL (ref 0.44–1.00)
GFR, Estimated: >60 mL/min (ref >60)
Glucose, Bld: 111 mg/dL — ABNORMAL HIGH (ref 70–99)
Potassium: 3.8 mmol/L (ref 3.5–5.1)
Sodium: 133 mmol/L — ABNORMAL LOW (ref 135–145)

## 2023-07-19 LAB — LIPID PANEL
Cholesterol: 124 mg/dL (ref 0–200)
HDL: 47 mg/dL (ref >40)
LDL Cholesterol: 60 mg/dL (ref 0–99)
Total CHOL/HDL Ratio: 2.6 {ratio}
Triglycerides: 84 mg/dL (ref <150)
VLDL: 17 mg/dL (ref 0–40)

## 2023-07-19 LAB — CBC
HCT: 31.8 % — ABNORMAL LOW (ref 36.0–46.0)
Hemoglobin: 9.9 g/dL — ABNORMAL LOW (ref 12.0–15.0)
MCH: 27.9 pg (ref 26.0–34.0)
MCHC: 31.1 g/dL (ref 30.0–36.0)
MCV: 89.6 fL (ref 80.0–100.0)
Platelets: 217 10*3/uL (ref 150–400)
RBC: 3.55 MIL/uL — ABNORMAL LOW (ref 3.87–5.11)
RDW: 14.5 % (ref 11.5–15.5)
WBC: 7.2 10*3/uL (ref 4.0–10.5)
nRBC: 0 % (ref 0.0–0.2)

## 2023-07-19 LAB — MAGNESIUM: Magnesium: 1.7 mg/dL (ref 1.7–2.4)

## 2023-07-19 LAB — PHOSPHORUS: Phosphorus: 3.5 mg/dL (ref 2.5–4.6)

## 2023-07-19 MED ORDER — MAGNESIUM OXIDE -MG SUPPLEMENT 400 (240 MG) MG PO TABS
800.0000 mg | ORAL_TABLET | Freq: Once | ORAL | Status: DC
  Filled 2023-07-19: qty 2

## 2023-07-19 MED ORDER — POTASSIUM CHLORIDE CRYS ER 20 MEQ PO TBCR
20.0000 meq | EXTENDED_RELEASE_TABLET | Freq: Once | ORAL | Status: DC
  Filled 2023-07-19: qty 1

## 2023-07-19 MED ORDER — ORAL CARE MOUTH RINSE: 15.0000 mL | OROMUCOSAL | Status: DC | PRN

## 2023-07-19 MED FILL — Lidocaine HCl Local Preservative Free (PF) Inj 1%: INTRAMUSCULAR | Qty: 30 | Status: AC

## 2023-07-19 NOTE — Evaluation (Signed)
Physical Therapy Evaluation Patient Details Name: [name] MRN: [id number] DOB: 1946/04/23 Today's Date: 07/19/2023  History of Present Illness  77 yo female admitted 11/5 due to LLE pain and discoloration with thrombosis and occlusion of her native left SFA and left SFA to popliteal bypass. 11/5 aortogram and angiogram with thrombectomy. 11/6 s/p L SFA stent and angioplasty. PMHx: CKD, psoriasis, bipolar disorder, HTN, pulmonary fibrosis, CVA, meningioma, hypothyroidism, CAD, RLS, HLD, left fem-pop BPG  Clinical Impression  Pt initially resistant to mobility stating fatigue but then agreeable after education for role of therapy. Pt lives at home with 24hr PCA but reports she is running out of money and is open to short term rehab. Pt with limited gait and mobility at home utilizing [facility] for longer distances. Pt with decreased strength, transfers, balance and function who will benefit from acute therapy to maximize mobility, safety and function to decrease burden of care. Difficulty obtaining accurate pulse ox pleth but when reading well 92% on RA. Patient will benefit from continued inpatient follow up therapy, <3 hours/day Encouraged up to chair and BSC throughout the day with nursing staff as well as LB HEP.         If plan is discharge home, recommend the following: A lot of help with walking and/or transfers;A lot of help with bathing/dressing/bathroom;Assist for transportation;Assistance with cooking/housework;Help with stairs or ramp for entrance   Can travel by private vehicle   No    Equipment Recommendations BSC/3in1  Recommendations for Other Services       Functional Status Assessment Patient has had a recent decline in their functional status and/or demonstrates limited ability to make significant improvements in function in a reasonable and predictable amount of time     Precautions / Restrictions Precautions Precautions: Fall      Mobility  Bed Mobility Overal bed  mobility: Needs Assistance Bed Mobility: Supine to Sit     Supine to sit: Used rails, Min assist, HOB elevated     General bed mobility comments: HOb 35 degrees with assist to elevate trunk    Transfers Overall transfer level: Needs assistance   Transfers: Sit to/from Stand Sit to Stand: Mod assist           General transfer comment: mod assist with cues for hand placement to rise from surface    Ambulation/Gait Ambulation/Gait assistance: Min assist, +2 safety/equipment Gait Distance (Feet): 5 Feet Assistive device: Rolling walker (2 wheels) Gait Pattern/deviations: Step-through pattern, Decreased stride length, Trunk flexed   Gait velocity interpretation: <1.8 ft/sec, indicate of risk for recurrent falls   General Gait Details: cues for posture and proximity to RW, close chair follow, limited by fatigue  Stairs            Wheelchair Mobility     Tilt Bed    Modified Rankin (Stroke Patients Only)       Balance Overall balance assessment: Needs assistance   Sitting balance-Leahy Scale: Fair     Standing balance support: Bilateral upper extremity supported, Reliant on assistive device for balance, During functional activity Standing balance-Leahy Scale: Poor Standing balance comment: RW in standing                             Pertinent Vitals/Pain Pain Assessment Pain Assessment: 0-10 Pain Score: 4  Pain Location: bil LE ache, RUE soreness from infiltration Pain Descriptors / Indicators: Aching Pain Intervention(s): Limited activity within patient's tolerance, Monitored during session,  Repositioned    Home Living Family/patient expects to be discharged to:: Private residence Living Arrangements: Alone Available Help at Discharge: Personal care attendant;Available 24 hours/day Type of Home: House Home Access: Stairs to enter Entrance Stairs-Rails: None Entrance Stairs-Number of Steps: 1   Home Layout: Two level;Able to live on  main level with bedroom/bathroom Home Equipment: Rollator (4 wheels);Shower seat;Wheelchair - manual Additional Comments: sleeps on the couch, walks max 20' with RW. uses WC to get to bathroom then RW in bathroom itself    Prior Function Prior Level of Function : Needs assist             Mobility Comments: Uses rollator mod I and WC ADLs Comments: PCA does IADLs and assists with ADLs     Extremity/Trunk Assessment   Upper Extremity Assessment Upper Extremity Assessment: Generalized weakness    Lower Extremity Assessment Lower Extremity Assessment: Generalized weakness    Cervical / Trunk Assessment Cervical / Trunk Assessment: Kyphotic  Communication   Communication Communication: No apparent difficulties  Cognition Arousal: Alert Behavior During Therapy: Flat affect Overall Cognitive Status: Within Functional Limits for tasks assessed                                 General Comments: can be easily agitated at times and self-limiting needing reassurance and encouragement        General Comments      Exercises General Exercises - Lower Extremity Ankle Circles/Pumps: AROM, Both, 10 reps, Seated Long Arc Quad: AROM, Both, Seated, 10 reps Hip Flexion/Marching: AROM, Both, Seated, 10 reps   Assessment/Plan    PT Assessment Patient needs continued PT services  PT Problem List Decreased strength;Decreased mobility;Decreased activity tolerance;Decreased balance;Decreased knowledge of use of DME       PT Treatment Interventions DME instruction;Gait training;Functional mobility training;Stair training;Therapeutic activities;Patient/family education;Therapeutic exercise    PT Goals (Current goals can be found in the Care Plan section)  Acute Rehab PT Goals Patient Stated Goal: get into an ILF PT Goal Formulation: With patient    Frequency Min 1X/week     Co-evaluation               AM-PAC PT "6 Clicks" Mobility  Outcome Measure Help  needed turning from your back to your side while in a flat bed without using bedrails?: A Little Help needed moving from lying on your back to sitting on the side of a flat bed without using bedrails?: A Little Help needed moving to and from a bed to a chair (including a wheelchair)?: A Little Help needed standing up from a chair using your arms (e.g., wheelchair or bedside chair)?: A Lot Help needed to walk in hospital room?: A Lot Help needed climbing 3-5 steps with a railing? : Total 6 Click Score: 14    End of Session   Activity Tolerance: Patient tolerated treatment well Patient left: in chair;with call bell/phone within reach;with chair alarm set Nurse Communication: Mobility status PT Visit Diagnosis: Other abnormalities of gait and mobility (R26.89);Difficulty in walking, not elsewhere classified (R26.2)    Time: 5284-1324 PT Time Calculation (min) (ACUTE ONLY): 31 min   Charges:   PT Evaluation $PT Eval Moderate Complexity: 1 Mod PT Treatments $Therapeutic Activity: 8-22 mins PT General Charges $$ ACUTE PT VISIT: 1 Visit         Merryl Hacker, PT Acute Rehabilitation Services Office: [phone]   [name] [name] 07/19/2023, 10:33  AM

## 2023-07-19 NOTE — Progress Notes (Signed)
PHARMACIST LIPID MONITORING   [name] is a 77 y.o. female admitted on 07/16/2023 with LLE bypass, proximal SFA stenting s/p thrombolysis of previous saphenous bypass.  Pharmacy has been consulted to optimize lipid-lowering therapy with the indication of secondary prevention for clinical ASCVD.  Recent Labs:  Lipid Panel (last 6 months):   Lab Results  Component Value Date   CHOL 124 07/19/2023   TRIG 84 07/19/2023   HDL 47 07/19/2023   CHOLHDL 2.6 07/19/2023   VLDL 17 07/19/2023   LDLCALC 60 07/19/2023    Hepatic function panel (last 6 months):   No results found for: "AST", "ALT", "ALKPHOS", "BILITOT", "BILIDIR", "IBILI"  SCr (since admission):   Serum creatinine: 0.92 mg/dL 78/29/56 2130 Estimated creatinine clearance: 52.2 mL/min  Current therapy and lipid therapy tolerance Current lipid-lowering therapy: none Previous lipid-lowering therapies (if applicable): atorvastatin 40 mg, lovastatin 20 mg, simvastatin 10 mg, pravastatin 40 mg Documented or reported allergies or intolerances to lipid-lowering therapies (if applicable): yes - patient reports N/V with all statins  Assessment:   Patient agrees with changes to lipid-lowering therapy - she is willing to try Zetia   Plan:    1.Statin intensity (high intensity recommended for all patients regardless of the LDL):  Statin intolerance noted. No statin changes due to serious side effects (ex. Myalgias with at least 2 different statins).  2.Add ezetimibe (if any one of the following):   Cannot tolerate statin at any dose.  3.Refer to lipid clinic:   No  4.Follow-up with:  Primary care provider - Shade Flood, MD  5.Follow-up labs after discharge:  Changes in lipid therapy were made. Check a lipid panel in 8-12 weeks then annually.     Trixie Rude, PharmD Clinical Pharmacist 07/19/2023  10:51 AM

## 2023-07-19 NOTE — NC FL2 (Signed)
[place] MEDICAID FL2 LEVEL OF CARE FORM     IDENTIFICATION  Patient Name: [name] Birthdate: 1946-04-12 Sex: female Admission Date (Current Location): 07/16/2023  [facility] [place] and [place] Number:  Producer, television/film/video and Address:  [facility], 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) 07/16/2023   AAA (abdominal aortic aneurysm) (HCC) 07/16/2023   Thoracic ascending aortic aneurysm (HCC) 07/16/2023   CVA (cerebral vascular accident) (HCC) 03/03/2023   Elevated troponin 03/03/2023   Hypertensive urgency 03/03/2023   CKD (chronic kidney disease), stage III (HCC) 03/03/2023   Psoriasis 03/03/2023   Centrilobular emphysema (HCC) 10/19/2020   Chronic respiratory failure with hypoxia (HCC) 12/26/2019   Shortness of breath 12/26/2019   Bilateral pulmonary infiltrates on CXR    Complicated UTI (urinary tract infection) 11/11/2019   AKI (acute kidney injury) (HCC) 11/11/2019   Hyponatremia 11/11/2019   Elevated LFTs 11/11/2019   Wound infection 11/07/2018   Postoperative pain    Sleep disturbance    Tobacco abuse    Acute blood loss anemia    Hypoalbuminemia due to protein-calorie malnutrition (HCC)    Debility 10/24/2018   Pre-operative cardiovascular examination 09/26/2018   HLD (hyperlipidemia) 09/26/2018   GAD (generalized anxiety disorder) 08/07/2018   Major depressive disorder, single episode 08/07/2018   Appendiceal abscess 07/08/2018   PVD (peripheral vascular disease) (HCC)  03/15/2018   Atherosclerosis of native artery of left lower extremity with intermittent claudication (HCC) 03/15/2018   Chronic left-sided low back pain with left-sided sciatica 12/25/2017   Facial droop    History of CVA (cerebrovascular accident) 10/23/2017   Critical lower limb ischemia (HCC) 11/08/2016   Smoker 11/08/2016   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 01/16/2014   Mild cognitive impairment 01/16/2014   Meningioma (HCC) 10/21/2013   Chest pain 10/20/2013   Medicare annual wellness visit, subsequent 06/16/2013   Dizziness and giddiness 06/16/2013   RLS (restless legs syndrome) 04/29/2012   Abdominal pain 12/27/2010   Osteoarthritis 09/23/2010   CAROTID ARTERY DISEASE 08/15/2010   CHEST PAIN 08/15/2010   Headache 12/02/2009   Backache 11/22/2009   CONSTIPATION, CHRONIC 01/29/2008   NAUSEA 01/28/2008   Depression 03/08/2007   HTN (hypertension) 03/08/2007    Orientation RESPIRATION BLADDER Height & Weight     Self, Situation, Place  Normal Continent Weight: 74.4 kg Height:  5\' 7"  (170.2 cm)  BEHAVIORAL SYMPTOMS/MOOD NEUROLOGICAL BOWEL NUTRITION STATUS      Continent Diet (see DC summary)  AMBULATORY STATUS COMMUNICATION OF NEEDS Skin   Extensive Assist Verbally Other (Comment) (purple toes, hx of bypass on leg)                       Personal Care Assistance Level of Assistance  Total care       Total Care Assistance: Maximum assistance   Functional Limitations Info  SPECIAL CARE FACTORS FREQUENCY  PT (By licensed PT), OT (By licensed OT)     PT Frequency: 5 x week OT Frequency: 5 x a week            Contractures Contractures Info: Not present    Additional Factors Info  Code Status Code Status Info: Full code             Current Medications (07/19/2023):  This is the current hospital active medication list Current Facility-Administered Medications   Medication Dose Route Frequency Provider Last Rate Last Admin   0.9 %  sodium chloride infusion  250 mL Intravenous PRN Victorino Sparrow, MD       acetaminophen (TYLENOL) tablet 650 mg  650 mg Oral Q4H PRN Victorino Sparrow, MD       amLODipine (NORVASC) tablet 5 mg  5 mg Oral Daily Victorino Sparrow, MD   5 mg at 07/19/23 1225   aspirin EC tablet 81 mg  81 mg Oral Daily Victorino Sparrow, MD   81 mg at 07/19/23 1227   Chlorhexidine Gluconate Cloth 2 % PADS 6 each  6 each Topical Daily Victorino Sparrow, MD   6 each at 07/18/23 1000   clopidogrel (PLAVIX) tablet 75 mg  75 mg Oral Q breakfast Victorino Sparrow, MD   75 mg at 07/19/23 0750   DULoxetine (CYMBALTA) DR capsule 30 mg  30 mg Oral Daily Victorino Sparrow, MD   30 mg at 07/19/23 1227   fentaNYL (SUBLIMAZE) injection 12.5-50 mcg  12.5-50 mcg Intravenous Q2H PRN Victorino Sparrow, MD   12.5 mcg at 07/19/23 0410   guaiFENesin (ROBITUSSIN) 100 MG/5ML liquid 5 mL  5 mL Oral Q4H PRN Victorino Sparrow, MD       hydrALAZINE (APRESOLINE) injection 10 mg  10 mg Intravenous Q4H PRN Victorino Sparrow, MD       hydrALAZINE (APRESOLINE) injection 5 mg  5 mg Intravenous Q20 Min PRN Victorino Sparrow, MD       ipratropium-albuterol (DUONEB) 0.5-2.5 (3) MG/3ML nebulizer solution 3 mL  3 mL Nebulization Q4H PRN Victorino Sparrow, MD       labetalol (NORMODYNE) injection 10 mg  10 mg Intravenous Q10 min PRN Victorino Sparrow, MD       linaclotide Karlene Einstein) capsule 290 mcg  290 mcg Oral QAC breakfast Victorino Sparrow, MD   290 mcg at 07/19/23 0751   losartan (COZAAR) tablet 25 mg  25 mg Oral Daily Victorino Sparrow, MD   25 mg at 07/19/23 1227   magnesium oxide (MAG-OX) tablet 800 mg  800 mg Oral Once Amin, Ankit C, MD       metoprolol tartrate (LOPRESSOR) injection 5 mg  5 mg Intravenous Q4H PRN Victorino Sparrow, MD       ondansetron [facility]) tablet 4 mg  4 mg Oral Q6H PRN Victorino Sparrow, MD       Or   ondansetron [facility]) injection 4 mg  4 mg Intravenous Q6H PRN  Victorino Sparrow, MD   4 mg at 07/17/23 1508   Oral care mouth rinse  15 mL Mouth Rinse PRN Amin, Ankit C, MD       oxyCODONE (Oxy IR/ROXICODONE) immediate release tablet 5 mg  5 mg Oral Q4H PRN Victorino Sparrow, MD   5 mg at 07/18/23 1033   polyethylene glycol (MIRALAX / GLYCOLAX) packet 34 g  34 g Oral Daily Victorino Sparrow, MD  34 g at 07/19/23 1227   potassium chloride SA (KLOR-CON M) CR tablet 20 mEq  20 mEq Oral Once Amin, Ankit C, MD       rOPINIRole (REQUIP) tablet 2 mg  2 mg Oral QHS Victorino Sparrow, MD   2 mg at 07/18/23 2156   senna-docusate (Senokot-S) tablet 1 tablet  1 tablet Oral QHS PRN Victorino Sparrow, MD       simethicone [facility]) chewable tablet 80 mg  80 mg Oral QID Victorino Sparrow, MD   80 mg at 07/16/23 2138   sodium chloride flush (NS) 0.9 % injection 3 mL  3 mL Intravenous Q12H Victorino Sparrow, MD   3 mL at 07/19/23 1015   sodium chloride flush (NS) 0.9 % injection 3 mL  3 mL Intravenous PRN Victorino Sparrow, MD       traZODone (DESYREL) tablet 50 mg  50 mg Oral QHS PRN Victorino Sparrow, MD         Discharge Medications: Please see discharge summary for a list of discharge medications.  Relevant Imaging Results:  Relevant Lab Results:   Additional Information SSN [id number]  Lockie Pares, RN

## 2023-07-19 NOTE — Evaluation (Signed)
Occupational Therapy Evaluation Patient Details Name: [name] MRN: [id number] DOB: 1945/09/26 Today's Date: 07/19/2023   History of Present Illness 77 yo female admitted 11/5 due to LLE pain and discoloration with thrombosis and occlusion of her native left SFA and left SFA to popliteal bypass. 11/5 aortogram and angiogram with thrombectomy. 11/6 s/p L SFA stent and angioplasty. PMHx: CKD, psoriasis, bipolar disorder, HTN, pulmonary fibrosis, CVA, meningioma, hypothyroidism, CAD, RLS, HLD, left fem-pop BPG   Clinical Impression   Patient admitted for the diagnosis above.  PTA she lives at home, but relies heavily on hired PCA to assist with ADL, iADL, mobility and community mobility.  Patient has needed increased assist after a prior stroke, and states she has been trying to work with [facility] PT/OT.  Patient has not been able to progress with HH therapies, and would benefit from more aggressive post acute rehab to maximize her functional status.  Currently she is needing up to Mod A to Max A for basic transfers and ADL completion.  Patient will benefit from continued inpatient follow up therapy, <3 hours/day, and OT will follow in the acute setting to address deficits.         If plan is discharge home, recommend the following: Assist for transportation;Assistance with cooking/housework;A lot of help with walking and/or transfers;A lot of help with bathing/dressing/bathroom;Help with stairs or ramp for entrance    Functional Status Assessment  Patient has had a recent decline in their functional status and demonstrates the ability to make significant improvements in function in a reasonable and predictable amount of time.  Equipment Recommendations  None recommended by OT    Recommendations for Other Services       Precautions / Restrictions Precautions Precautions: Fall Restrictions Weight Bearing Restrictions: No      Mobility Bed Mobility               General bed  mobility comments: up in recliner    Transfers Overall transfer level: Needs assistance   Transfers: Sit to/from Stand Sit to Stand: Mod assist                  Balance Overall balance assessment: Needs assistance Sitting-balance support: Feet supported Sitting balance-Leahy Scale: Fair     Standing balance support: Reliant on assistive device for balance Standing balance-Leahy Scale: Poor                             ADL either performed or assessed with clinical judgement   ADL Overall ADL's : Needs assistance/impaired Eating/Feeding: Set up;Sitting   Grooming: Wash/dry face;Oral care;Set up;Sitting   Upper Body Bathing: Minimal assistance;Sitting   Lower Body Bathing: Moderate assistance;Maximal assistance;Sit to/from stand   Upper Body Dressing : Minimal assistance;Moderate assistance;Sitting   Lower Body Dressing: Moderate assistance;Maximal assistance;Sit to/from stand   Toilet Transfer: Moderate assistance;Stand-pivot                   Vision Patient Visual Report: No change from baseline       Perception Perception: Within Functional Limits       Praxis Praxis: WFL       Pertinent Vitals/Pain Pain Assessment Pain Assessment: Faces Faces Pain Scale: Hurts little more Pain Location: bil LE ache, RUE soreness from infiltration Pain Descriptors / Indicators: Grimacing, Guarding Pain Intervention(s): Monitored during session     Extremity/Trunk Assessment Upper Extremity Assessment Upper Extremity Assessment: Generalized weakness;RUE deficits/detail;LUE deficits/detail RUE  Deficits / Details: Decreased end range shoulder flexion RUE Sensation: WNL RUE Coordination: WNL LUE Deficits / Details: Decreased end range shoulder flexion LUE Sensation: WNL LUE Coordination: WNL   Lower Extremity Assessment Lower Extremity Assessment: Defer to PT evaluation   Cervical / Trunk Assessment Cervical / Trunk Assessment: Kyphotic    Communication Communication Communication: No apparent difficulties   Cognition Arousal: Alert   Overall Cognitive Status: Within Functional Limits for tasks assessed                                       General Comments       Exercises     Shoulder Instructions      Home Living Family/patient expects to be discharged to:: Private residence Living Arrangements: Alone Available Help at Discharge: Personal care attendant;Available 24 hours/day Type of Home: House Home Access: Stairs to enter [employer] of Steps: 1 Entrance Stairs-Rails: None Home Layout: Two level;Able to live on main level with bedroom/bathroom     Bathroom Shower/Tub: Walk-in shower;Sponge bathes at baseline   Bathroom Toilet: Handicapped height Bathroom Accessibility: Yes How Accessible: Accessible via walker Home Equipment: Rollator (4 wheels);Shower seat;Wheelchair - manual   Additional Comments: sleeps on the couch, walks max 20' with RW. uses WC to get to bathroom then RW in bathroom itself      Prior Functioning/Environment Prior Level of Function : Needs assist             Mobility Comments: Uses rollator mod I and WC ADLs Comments: PCA does IADLs and assists with ADLs, son has groceries delivered.        OT Problem List: Decreased strength;Decreased range of motion;Decreased activity tolerance;Impaired balance (sitting and/or standing);Pain;Decreased safety awareness      OT Treatment/Interventions: Self-care/ADL training;Therapeutic activities;Balance training;DME and/or AE instruction    OT Goals(Current goals can be found in the care plan section) Acute Rehab OT Goals Patient Stated Goal: Need to be able to walk better OT Goal Formulation: With patient Time For Goal Achievement: 08/02/23 Potential to Achieve Goals: Good ADL Goals Pt Will Perform Grooming: with supervision;standing Pt Will Perform Upper Body Dressing: with set-up;sitting Pt Will  Perform Lower Body Dressing: with min assist;sit to/from stand Pt Will Transfer to Toilet: with supervision;regular height toilet;ambulating  OT Frequency: Min 1X/week    Co-evaluation              AM-PAC OT "6 Clicks" Daily Activity     Outcome Measure Help from another person eating meals?: None Help from another person taking care of personal grooming?: None Help from another person toileting, which includes using toliet, bedpan, or urinal?: A Lot Help from another person bathing (including washing, rinsing, drying)?: A Lot Help from another person to put on and taking off regular upper body clothing?: A Lot Help from another person to put on and taking off regular lower body clothing?: A Lot 6 Click Score: 16   End of Session Equipment Utilized During Treatment: Rolling walker (2 wheels) Nurse Communication: Mobility status  Activity Tolerance: Patient tolerated treatment well Patient left: in chair;with call bell/phone within reach;with chair alarm set  OT Visit Diagnosis: Unsteadiness on feet (R26.81);Muscle weakness (generalized) (M62.81);Pain Pain - Right/Left: Right Pain - part of body: Arm                Time: 0953-1010 OT Time Calculation (min): 17 min Charges:  OT  General Charges $OT Visit: 1 Visit OT Evaluation $OT Eval Moderate Complexity: 1 Mod  07/19/2023  RP, OTR/L  Acute Rehabilitation Services  Office:  [phone]   Suzanna Obey 07/19/2023, 10:49 AM

## 2023-07-19 NOTE — Progress Notes (Addendum)
  Progress Note    07/19/2023 7:54 AM 1 Day Post-Op  Subjective:  feels good this morning. Denies any pain    Vitals:   07/19/23 0700 07/19/23 0730  BP: 138/86   Pulse:  85  Resp: 20 19  Temp:    SpO2:  90%    Physical Exam: General:  resting comfortably Cardiac:  regular Lungs:  nonlabored Incisions:  right groin cath site soft without hematoma Extremities:  right DP/PT doppler signals. Palpable left DP  CBC    Component Value Date/Time   WBC 7.2 07/19/2023 0325   RBC 3.55 (L) 07/19/2023 0325   HGB 9.9 (L) 07/19/2023 0325   HGB 11.9 01/02/2020 1602   HCT 31.8 (L) 07/19/2023 0325   HCT 36.4 01/02/2020 1602   PLT 217 07/19/2023 0325   PLT 370 01/02/2020 1602   MCV 89.6 07/19/2023 0325   MCV 90 01/02/2020 1602   MCH 27.9 07/19/2023 0325   MCHC 31.1 07/19/2023 0325   RDW 14.5 07/19/2023 0325   RDW 14.0 01/02/2020 1602   LYMPHSABS 2.0 03/03/2023 0336   LYMPHSABS 2.7 01/02/2020 1602   MONOABS 0.7 03/03/2023 0336   EOSABS 0.3 03/03/2023 0336   EOSABS 0.1 01/02/2020 1602   BASOSABS 0.0 03/03/2023 0336   BASOSABS 0.0 01/02/2020 1602    BMET    Component Value Date/Time   NA 133 (L) 07/19/2023 0325   NA 135 01/02/2020 1602   K 3.8 07/19/2023 0325   CL 101 07/19/2023 0325   CO2 25 07/19/2023 0325   GLUCOSE 111 (H) 07/19/2023 0325   BUN 12 07/19/2023 0325   BUN 17 01/02/2020 1602   CREATININE 0.92 07/19/2023 0325   CREATININE 0.77 07/28/2015 1805   CALCIUM 8.6 (L) 07/19/2023 0325   GFRNONAA >60 07/19/2023 0325   GFRAA 58 (L) 01/02/2020 1602    INR    Component Value Date/Time   INR 1.0 07/16/2023 1433     Intake/Output Summary (Last 24 hours) at 07/19/2023 0754 Last data filed at 07/19/2023 0700 Gross per 24 hour  Intake 1437.68 ml  Output 1100 ml  Net 337.68 ml      Assessment/Plan:  77 y.o. female is s/p: lysis of LLE bypass with proximal SFA stenting   -Doing well after lysis check yesterday. Right groin sheath was removed. Groin is  soft this morning without hematoma -LLE warm and well perfused with palpable DP pulse -Hemoglobin with small drop at 9.9. No evidence of further blood loss -Continue ASA, plavix, and statin. Okay to remove knee immobilizer and get OOB with PT/OT -Stable for d/c from vascular perspective   Loel Dubonnet, PA-C [facility] [phone] 07/19/2023 7:54 AM   I have seen and evaluated the patient. I agree with the PA note as documented above.  Patient underwent thrombolytics catheter check of her occluded left leg bypass yesterday with stenting of the native SFA.  Now has a patent left SFA to popliteal bypass.  Palpable DP pulse in the left foot.  Right groin looks fine.  Hemoglobin 9.9.  Okay for discharge from our standpoint.  Discussed aspirin Plavix at a minimum.  She has a statin allergy.  Will arrange follow-up in 1 month with noninvasive imaging in our office.  Cephus Shelling, MD [facility] [place] Office: [phone]

## 2023-07-19 NOTE — TOC Progression Note (Signed)
Transition of Care [facility]) - Progression Note    Patient Details  Name: [name] MRN: [id number] Date of Birth: [birth year]  Transition of Care [facility]) CM/SW Contact  Lockie Pares, RN Phone Number: 07/19/2023, 1:46 PM  Clinical Narrative:    FL2 done Faxed out to SNF, [facility] per patient.    Expected Discharge Plan: Skilled Nursing Facility Barriers to Discharge: Continued Medical Work up  Expected Discharge Plan and Services In-house Referral: Clinical Social Work     Living arrangements for the past 2 months: Single Family Home                                       Social Determinants of Health (SDOH) Interventions SDOH Screenings   Food Insecurity: No Food Insecurity (07/16/2023)  Housing: Low Risk  (07/16/2023)  Transportation Needs: No Transportation Needs (07/16/2023)  Utilities: Not At Risk (07/16/2023)  Alcohol Screen: Low Risk  (06/13/2023)  Depression (PHQ2-9): High Risk (06/13/2023)  Financial Resource Strain: Low Risk  (03/23/2021)  Physical Activity: Inactive (06/13/2023)  Social Connections: Socially Isolated (06/13/2023)  Stress: Stress Concern Present (06/13/2023)  Tobacco Use: High Risk (07/16/2023)  Health Literacy: Adequate Health Literacy (06/13/2023)    Readmission Risk Interventions     No data to display

## 2023-07-19 NOTE — Progress Notes (Signed)
PROGRESS NOTE    [name]  VQQ:[id number] DOB: 01/29/1946 DOA: 07/16/2023 PCP: Shade Flood, MD     Brief hospital course: 77 y.o. female with medical history significant for hypertension, depression, anxiety, emphysema, pulmonary fibrosis, history of CVA, and PAD status post left femoral to below the knee popliteal artery bypass grafting in February 2020 who now presents with pain and discoloration of the distal extremities.  Symptoms ongoing for 2 months.  Patient found to have critical limb ischemia and underwent angiogram which noted occluded left superficial artery requiring thrombectomy of left FSA popliteal bypass on 11/5. Returned back to Cath Lab on 11/6 for DES to proximal superficial femoral artery, drug-coated balloon angioplasty of distal bypass.      Assessment and Plan:  Left lower remedy critical ischemia status post thrombectomy of left FSA-popliteal bypass. Patient underwent procedure on 11/5, perfusion slowly improving.  Returned back to Cath Lab on 11/6 for DES to proximal superficial femoral artery, drug-coated balloon angioplasty of distal bypass.   History of CVA Continue aspirin.  Unable to tolerate statins   Emphysema; pulmonary fibrosis  As needed bronchodilators   Essential hypertension Losartan, Norvasc.  IV as needed   Depression, anxiety  - Continue Cymbalta     AAA; aneurysm of ascending thoracic aorta - Noted on CTA in ED, outpatient follow-up recommended    Resume PT/OT once cleared by vascular  DVT Prophylaxis: Subcutaneous Lovenox  CODE STATUS: Full code Family Communication:   Disposition:  Ongoing vascular evaluation.  Will restart mobility once cleared by their service   Subjective:  Post OP doing well no complaints. Sitting up in her chair Some pain in her arm after IV infiltration   Physical Exam: Constitutional: Not in acute distress, dry mouth Respiratory: Clear to auscultation bilaterally Cardiovascular: Normal  sinus rhythm, no rubs Abdomen: Nontender nondistended good bowel sounds Musculoskeletal: No edema noted Skin: No rashes seen, color of the left foot appears better.  Right groin incision noted. Neurologic: CN 2-12 grossly intact.  And nonfocal Psychiatric: Normal judgment and insight. Alert and oriented x 3. Normal mood.      Pressure Injury 07/17/23 Sacrum Mid Stage 1 -  Intact skin with non-blanchable redness of a localized area usually over a bony prominence. (Active)  07/17/23 1700  Location: Sacrum  Location Orientation: Mid  Staging: Stage 1 -  Intact skin with non-blanchable redness of a localized area usually over a bony prominence.  Wound Description (Comments):   Present on Admission: Yes     Diet Orders (From admission, onward)     Start     Ordered   07/18/23 1737  Diet regular Room service appropriate? Yes; Fluid consistency: Thin  Diet effective now       Question Answer Comment  Room service appropriate? Yes   Fluid consistency: Thin      07/18/23 1736            Objective: Vitals:   07/19/23 0900 07/19/23 0923 07/19/23 0931 07/19/23 1102  BP: (!) 117/55     Pulse: 68 73 84   Resp: 14     Temp:    98.5 F (36.9 C)  TempSrc:    Oral  SpO2: 92% 92%    Weight:      Height:        Intake/Output Summary (Last 24 hours) at 07/19/2023 1139 Last data filed at 07/19/2023 0700 Gross per 24 hour  Intake 1302.95 ml  Output 1100 ml  Net 202.95 ml  Filed Weights   07/16/23 2227  Weight: 74.4 kg    Scheduled Meds:  amLODipine  5 mg Oral Daily   aspirin EC  81 mg Oral Daily   Chlorhexidine Gluconate Cloth  6 each Topical Daily   clopidogrel  75 mg Oral Q breakfast   DULoxetine  30 mg Oral Daily   linaclotide  290 mcg Oral QAC breakfast   losartan  25 mg Oral Daily   polyethylene glycol  34 g Oral Daily   rOPINIRole  2 mg Oral QHS   simethicone  80 mg Oral QID   sodium chloride flush  3 mL Intravenous Q12H   Continuous Infusions:  sodium  chloride      Nutritional status     Body mass index is 25.69 kg/m.  Data Reviewed:   CBC: Recent Labs  Lab 07/17/23 0537 07/17/23 1700 07/17/23 2250 07/18/23 0839 07/19/23 0325  WBC 7.3 7.8 9.6 6.2 7.2  HGB 11.4* 12.0 11.7* 11.2* 9.9*  HCT 36.9 37.8 37.1 35.6* 31.8*  MCV 87.4 86.3 87.3 88.8 89.6  PLT 298 323 228 258 217   Basic Metabolic Panel: Recent Labs  Lab 07/16/23 1254 07/17/23 0537 07/18/23 0839 07/19/23 0325  NA 134* 132* 133* 133*  K 4.6 4.0 4.0 3.8  CL 98 99 101 101  CO2 20* 25 25 25   GLUCOSE 105* 117* 101* 111*  BUN 17 14 13 12   CREATININE 0.98 0.95 0.90 0.92  CALCIUM 9.5 8.9 8.8* 8.6*  MG  --   --  1.8 1.7  PHOS  --   --  3.6 3.5   GFR: Estimated Creatinine Clearance: 52.2 mL/min (by C-G formula based on SCr of 0.92 mg/dL). Liver Function Tests: No results for input(s): "AST", "ALT", "ALKPHOS", "BILITOT", "PROT", "ALBUMIN" in the last 168 hours. No results for input(s): "LIPASE", "AMYLASE" in the last 168 hours. No results for input(s): "AMMONIA" in the last 168 hours. Coagulation Profile: Recent Labs  Lab 07/16/23 1433  INR 1.0   Cardiac Enzymes: No results for input(s): "CKTOTAL", "CKMB", "CKMBINDEX", "TROPONINI" in the last 168 hours. BNP (last 3 results) No results for input(s): "PROBNP" in the last 8760 hours. HbA1C: No results for input(s): "HGBA1C" in the last 72 hours. CBG: Recent Labs  Lab 07/16/23 2251 07/17/23 1637  GLUCAP 111* 81   Lipid Profile: Recent Labs    07/19/23 0325  CHOL 124  HDL 47  LDLCALC 60  TRIG 84  CHOLHDL 2.6   Thyroid Function Tests: No results for input(s): "TSH", "T4TOTAL", "FREET4", "T3FREE", "THYROIDAB" in the last 72 hours. Anemia Panel: No results for input(s): "VITAMINB12", "FOLATE", "FERRITIN", "TIBC", "IRON", "RETICCTPCT" in the last 72 hours. Sepsis Labs: Recent Labs  Lab 07/16/23 1338 07/16/23 1455  LATICACIDVEN 1.4 0.9    Recent Results (from the past 240 hour(s))  MRSA  Next Gen by PCR, Nasal     Status: None   Collection Time: 07/17/23  5:06 PM   Specimen: Nasal Mucosa; Nasal Swab  Result Value Ref Range Status   MRSA by PCR Next Gen NOT DETECTED NOT DETECTED Final    Comment: (NOTE) The GeneXpert MRSA Assay (FDA approved for NASAL specimens only), is one component of a comprehensive MRSA colonization surveillance program. It is not intended to diagnose MRSA infection nor to guide or monitor treatment for MRSA infections. Test performance is not FDA approved in patients less than 13 years old. Performed at [facility], [address] [place], [place] [address]  Radiology Studies: PERIPHERAL VASCULAR CATHETERIZATION  Result Date: 07/18/2023 Images from the original result were not included.   Patient name: [name]           MRN: [id number]        DOB: [birth year]            Sex: female  07/18/2023 Pre-operative Diagnosis: Left lower extremity critical and ischemia with rest pain Post-operative diagnosis:  Same Surgeon:  Victorino Sparrow, MD Procedure Performed: 1.  Left lower extremity lytic catheter recheck 2.  Drug-eluting stenting of the proximal superficial femoral artery 7 x 60 mm Eluvia 3.  Drug-coated balloon angioplasty distal bypass 4 x 40 mm 4.  Moderate sedation time 78 minutes 5.  Contrast volume 75 mL 6.  Manual pressure for access management   Indications: Patient is a 77 year old female with previous history of SFA to below-knee popliteal artery interposition bypass graft for aneurysmal disease.  She was lost to follow-up and presented to the hospital earlier this week with new onset rest pain.  Imaging demonstrated occlusion of left-sided bypass graft.  Pharmacomechanical thrombolysis was initiated.  She presents today for lytic catheter recheck.  Findings:  Widely patent common femoral artery, profunda.  There is some ostial stenosis of the profunda.  The superficial femoral artery appears to have some mural thrombus  proximally and has severe disease for roughly 6 cm prior to ending and greater saphenous vein bypass graft.  There was an area of focal, greater than 90% stenosis.  The bypass graft was widely patent with greater than 70% stenosis immediately proximal to the distal anastomosis.  The anterior tibial artery was atretic.  Two-vessel-peroneal and posterior tibial artery runoff to the foot with reconstitution of the dorsalis pedis artery             Procedure:  The patient was identified in the holding area and taken to room 8.  The patient was then placed supine on the table and prepped and draped in the usual sterile fashion.  A time out was called.   The case began with removal of the lytic catheter and replacement with wire using Seldinger technique.  Left lower extremity angiography followed from the common femoral artery.  See results above.  I elected to attempt intervention.  The patient was anticoagulated with 5000 units IV heparin.  A 7 x 60 mm Eluvia stent was brought to the field and deployed in the proximal superficial femoral artery to cover what appeared to be thrombus.  This was postdilated with a 6 mm balloon with an excellent result.  Distally, the distal portion of the bypass graft was balloon angioplastied using a 4 x 40 mm balloon.  This was inflated for 3 minutes.  Follow-up angiography demonstrated excellent result with resolution of the stenosis.  My plan was to close the right arteriotomy site using a ProGlide device but the wire and sheath were inadvertently pulled, necessitating manual pressure.  Protamine was given, as well as multiple agents to lower the patient's blood pressure.  Pressure was held for 65 minutes due to bleeding at the 30-minute mark and 45-minute mark.  Impression: Successful thrombolysis of the previous greater saphenous vein interposition bypass.  Stenting of 90% stenotic lesion in the proximal SFA, balloon angioplasty of the distal bypass graft.   Victorino Sparrow MD  [facility] [place] Office: [phone]    PERIPHERAL VASCULAR CATHETERIZATION  Result Date: 07/17/2023 Images from the original result were not included. Patient  name: [name] MRN: [id number] DOB: 10/02/1945 Sex: female 07/17/2023 Pre-operative Diagnosis: left leg ischemia Post-operative diagnosis:  Same Surgeon:  [name] Procedure Performed:  1.  U/s Guided access  2.  Aortogram with bifemoral runoff  3.  Left leg angiogram  4.  Selective injection with catheter in right popliteal artery  5.  Mechanical thrombectomy of left superficial femoral artery and above-knee popliteal to below-knee popliteal bypass graft  6.  Initiation of thrombolytic therapy  7.  Conscious sedation, 54 minutes Indications: This is a 77 year old female with history of left above-knee to below-knee popliteal artery bypass graft for rest pain in 2020.  She has a history of stroke in June which was an ischemic stroke.  She has developed discoloration to the toes on her left foot as well as rest pain.  Imaging has revealed her bypass graft is occluded.  She comes in today for further evaluation and possible intervention Procedure:  The patient was identified in the holding area and taken to room 8.  The patient was then placed supine on the table and prepped and draped in the usual sterile fashion.  A time out was called.  Conscious sedation was administered with the use of IV fentanyl and Versed under continuous physician and nurse monitoring.  Heart rate, blood pressure, and oxygen saturation were continuously monitored.  Total sedation time was 54 minutes.  Ultrasound was used to evaluate the right common femoral artery.  It was patent .  A digital ultrasound image was acquired.  A micropuncture needle was used to access the right common femoral artery under ultrasound guidance.  An 018 wire was advanced without resistance and a micropuncture sheath was placed.  The 018 wire was removed and a benson  wire was placed.  The micropuncture sheath was exchanged for a 5 french sheath.  An omniflush catheter was advanced over the wire to the level of L-1.  An abdominal angiogram was obtained.  Next, using the omniflush catheter and a benson wire, the aortic bifurcation was crossed and the catheter was placed into theleft external iliac artery and left runoff was obtained. Findings:  Aortogram: No significant renal artery stenosis.  The infrarenal abdominal aorta is ectatic but without stenosis.  Bilateral common and external iliac arteries are widely patent.  Bilateral common femoral arteries are widely patent  Right Lower Extremity: The left common femoral and profundofemoral artery are widely patent.  There is a superficial femoral artery occlusion with a small proximal stump.  The superficial femoral artery does not opacify retrograde through the bypass graft.  There is two-vessel runoff on the posterior tibial and peroneal artery  Left Lower Extremity: Not evaluated Intervention: After the above images were acquired the decision was made to proceed with intervention.  A 7 French 45 cm sheath was inserted into the left extrailiac artery.  The patient was fully heparinized.  Next, using a 035 Glidewire and a quick cross catheter, the superficial femoral artery was selected.  The wire was then able to be navigated into the distal superficial femoral artery and through the metallic ring that represented the proximal anastomosis of the bypass graft.  A selective contrast injection with a catheter in the popliteal artery and the bypass graft was performed confirming successful crossing.  I then placed a long Bentson wire.  I used the penumbra CAT 7 device to perform mechanical thrombectomy of the left superficial femoral artery as well as the left above-knee to below-knee popliteal bypass graft.  Multiple  passes were made.  On follow-up imaging there did appear to be some residual thrombus up near the proximal  anastomosis.  I contemplated stenting this, however ultimately I decided to place a thrombolytic catheter into the SFA and bypass graft and run her at low-dose tPA overnight to see if this thrombus will resolve.  She will be brought back tomorrow for follow-up study Impression:  #1  Occluded left superficial femoral artery  #2  Mechanical thrombectomy of left superficial femoral artery and above-knee to below-knee popliteal artery bypass graft with penumbra CAT 7  #3  Patient will be brought back tomorrow for follow-up study V. [name], M.D., [facility] [place] Office: [phone] Pager:  [phone]           LOS: 3 days   Time spent= 35 mins    Miguel Rota, MD [facility]  If 7PM-7AM, please contact night-coverage  07/19/2023, 11:39 AM

## 2023-07-19 NOTE — Progress Notes (Signed)
Pt refused to get up and void at this time. She said "it's a little early. I pee closer to bed". Pt says that she has not had issues with voiding and that she usually voids before going to sleep. Pt refused to have a bladder scan done. Will continue to monitor.

## 2023-07-19 NOTE — Consult Note (Signed)
[facility] Liaison Consult Note    07/19/2023  [name] 05-17-1946 [id number]  [facility] [[facility]] community LCSW was referred in the past to assist patient and family with ILF with PCP office  Insurance: Medicare ACO REACH   Primary Care Provider: Shade Flood, MD, with [place] [facility], this provider is listed for the transition of care follow up appointments  and community [facility] calls   [facility] Liaison reviewed patient admitted to SDU and currently in telemetry LOC for possible SNF rehab.    The patient was assessed for any ongoing Community Care Coordination service needs for post hospital transition for care coordination. Review of patient's electronic medical record reveals patient is being worked up SNF.   Plan: [facility] Liaison will continue to follow progress and disposition to asess for post hospital community care coordination/management needs.  Referral request for community care coordination: follow up with [facility] LCSW when available for follow up.   [facility] does not replace or interfere with any arrangements made by the Inpatient Transition of Care team.   For questions contact:   Charlesetta Shanks, RN, BSN, CCM [place]  [facility], [facility] Liaison Direct Dial: [phone] or secure chat Email: [email or web address]

## 2023-07-19 NOTE — Plan of Care (Signed)
  Problem: Education: Goal: Knowledge of General Education information will improve Description: Including pain rating scale, medication(s)/side effects and non-pharmacologic comfort measures Outcome: Progressing   Problem: Health Behavior/Discharge Planning: Goal: Ability to manage health-related needs will improve Outcome: Progressing   Problem: Nutrition: Goal: Adequate nutrition will be maintained Outcome: Progressing   Problem: Coping: Goal: Level of anxiety will decrease Outcome: Progressing   Problem: Elimination: Goal: Will not experience complications related to urinary retention Outcome: Progressing   Problem: Pain Management: Goal: General experience of comfort will improve Outcome: Progressing

## 2023-07-19 NOTE — TOC Initial Note (Signed)
Transition of Care [facility]) - Initial/Assessment Note    Patient Details  Name: [name] MRN: [id number] Date of Birth: 1945/12/03  [facility]) CM/SW Contact:    Delilah Shan, LCSWA Phone Number: 07/19/2023, 11:51 AM  Clinical Narrative:                  CSW received consult for possible SNF placement at time of discharge. CSW spoke with patient at bedside regarding PT recommendation of SNF placement at time of discharge. Patient reports she comes from home alone. Patient expressed understanding of PT recommendation and is agreeable to SNF placement at time of discharge. Patient requested for CSW to call her son [name] to confirm where for CSW to fax out too. Patient request for CSW to follow up with her and son when her bed offers are in. CSW discussed insurance authorization process.No further questions reported at this time. CSW spoke with patients son [name] who gave CSW permission to fax initial referral near the [place] and [place] area. Patients top choices for SNF is [facility]. No further questions reported at this time. CSW to continue to follow and assist with discharge planning needs.   Expected Discharge Plan: Skilled Nursing Facility Barriers to Discharge: Continued Medical Work up   Patient Goals and CMS Choice Patient states their goals for this hospitalization and ongoing recovery are:: SNF CMS Medicare.gov Compare Post Acute Care list provided to:: Patient Choice offered to / list presented to : Patient, Adult Children (Patient and Son)      Expected Discharge Plan and Services In-house Referral: Clinical Social Work     Living arrangements for the past 2 months: Single Family Home                                      Prior Living Arrangements/Services Living arrangements for the past 2 months: Single Family Home Lives with:: Self Patient language and need for interpreter reviewed:: Yes Do you feel safe going back to  the place where you live?: No   SNF  Need for Family Participation in Patient Care: Yes (Comment) Care giver support system in place?: Yes (comment)   Criminal Activity/Legal Involvement Pertinent to Current Situation/Hospitalization: No - Comment as needed  Activities of Daily Living   ADL Screening (condition at time of admission) Independently performs ADLs?: No Does the patient have a NEW difficulty with bathing/dressing/toileting/self-feeding that is expected to last >3 days?: Yes (Initiates electronic notice to provider for possible OT consult) Does the patient have a NEW difficulty with getting in/out of bed, walking, or climbing stairs that is expected to last >3 days?: Yes (Initiates electronic notice to provider for possible PT consult) Does the patient have a NEW difficulty with communication that is expected to last >3 days?: No Is the patient deaf or have difficulty hearing?: No Does the patient have difficulty seeing, even when wearing glasses/contacts?: No Does the patient have difficulty concentrating, remembering, or making decisions?: No  Permission Sought/Granted Permission sought to share information with : Case Manager, Magazine features editor, Family Supports Permission granted to share information with : Yes, Verbal Permission Granted  Share Information with NAME: [name]  Permission granted to share info w AGENCY: SNF  Permission granted to share info w Relationship: son  Permission granted to share info w Contact Information: [name] [phone]  Emotional Assessment Appearance:: Appears stated age Attitude/Demeanor/Rapport: Gracious  Affect (typically observed): Calm Orientation: : Oriented to Self, Oriented to Place, Oriented to  Time, Oriented to Situation Alcohol / Substance Use: Not Applicable Psych Involvement: No (comment)  Admission diagnosis:  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) [I74.3] Critical limb ischemia of left lower extremity  (HCC) [I70.222] Patient Active Problem List   Diagnosis Date Noted   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) 07/16/2023   AAA (abdominal aortic aneurysm) (HCC) 07/16/2023   Thoracic ascending aortic aneurysm (HCC) 07/16/2023   CVA (cerebral vascular accident) (HCC) 03/03/2023   Elevated troponin 03/03/2023   Hypertensive urgency 03/03/2023   CKD (chronic kidney disease), stage III (HCC) 03/03/2023   Psoriasis 03/03/2023   Centrilobular emphysema (HCC) 10/19/2020   Chronic respiratory failure with hypoxia (HCC) 12/26/2019   Shortness of breath 12/26/2019   Bilateral pulmonary infiltrates on CXR    Complicated UTI (urinary tract infection) 11/11/2019   AKI (acute kidney injury) (HCC) 11/11/2019   Hyponatremia 11/11/2019   Elevated LFTs 11/11/2019   Wound infection 11/07/2018   Postoperative pain    Sleep disturbance    Tobacco abuse    Acute blood loss anemia    Hypoalbuminemia due to protein-calorie malnutrition (HCC)    Debility 10/24/2018   Pre-operative cardiovascular examination 09/26/2018   HLD (hyperlipidemia) 09/26/2018   GAD (generalized anxiety disorder) 08/07/2018   Major depressive disorder, single episode 08/07/2018   Appendiceal abscess 07/08/2018   PVD (peripheral vascular disease) (HCC) 03/15/2018   Atherosclerosis of native artery of left lower extremity with intermittent claudication (HCC) 03/15/2018   Chronic left-sided low back pain with left-sided sciatica 12/25/2017   Facial droop    History of CVA (cerebrovascular accident) 10/23/2017   Critical lower limb ischemia (HCC) 11/08/2016   Smoker 11/08/2016   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 01/16/2014   Mild cognitive impairment 01/16/2014   Meningioma (HCC) 10/21/2013   Chest pain 10/20/2013   Medicare annual wellness visit, subsequent 06/16/2013   Dizziness and giddiness 06/16/2013   RLS (restless legs syndrome) 04/29/2012    Abdominal pain 12/27/2010   Osteoarthritis 09/23/2010   CAROTID ARTERY DISEASE 08/15/2010   CHEST PAIN 08/15/2010   Headache 12/02/2009   Backache 11/22/2009   CONSTIPATION, CHRONIC 01/29/2008   NAUSEA 01/28/2008   Depression 03/08/2007   HTN (hypertension) 03/08/2007   PCP:  Shade Flood, MD Pharmacy:   [facility] [phone] [address] [address], [place] - [address] AT [facility] [address] [facility] [address] [place] [address] Phone: [phone] Fax: [phone]     Social Determinants of Health (SDOH) Social History: SDOH Screenings   Food Insecurity: No Food Insecurity (07/16/2023)  Housing: Low Risk  (07/16/2023)  Transportation Needs: No Transportation Needs (07/16/2023)  Utilities: Not At Risk (07/16/2023)  Alcohol Screen: Low Risk  (06/13/2023)  Depression (PHQ2-9): High Risk (06/13/2023)  Financial Resource Strain: Low Risk  (03/23/2021)  Physical Activity: Inactive (06/13/2023)  Social Connections: Socially Isolated (06/13/2023)  Stress: Stress Concern Present (06/13/2023)  Tobacco Use: High Risk (07/16/2023)  Health Literacy: Adequate Health Literacy (06/13/2023)   SDOH Interventions:     Readmission Risk Interventions     No data to display

## 2023-07-20 ENCOUNTER — Encounter: Payer: Medicare Other | Admitting: Vascular Surgery

## 2023-07-20 ENCOUNTER — Other Ambulatory Visit (HOSPITAL_COMMUNITY): Payer: Medicare Other

## 2023-07-20 DIAGNOSIS — I743 Embolism and thrombosis of arteries of the lower extremities: Secondary | ICD-10-CM | POA: Diagnosis not present

## 2023-07-20 DIAGNOSIS — I10 Essential (primary) hypertension: Secondary | ICD-10-CM | POA: Diagnosis not present

## 2023-07-20 DIAGNOSIS — I739 Peripheral vascular disease, unspecified: Secondary | ICD-10-CM

## 2023-07-20 DIAGNOSIS — J432 Centrilobular emphysema: Secondary | ICD-10-CM | POA: Diagnosis not present

## 2023-07-20 LAB — BASIC METABOLIC PANEL
Anion gap: 6 (ref 5–15)
BUN: 13 mg/dL (ref 8–23)
CO2: 26 mmol/L (ref 22–32)
Calcium: 8.5 mg/dL — ABNORMAL LOW (ref 8.9–10.3)
Chloride: 99 mmol/L (ref 98–111)
Creatinine, Ser: 1.09 mg/dL — ABNORMAL HIGH (ref 0.44–1.00)
GFR, Estimated: 52 mL/min — ABNORMAL LOW (ref >60)
Glucose, Bld: 99 mg/dL (ref 70–99)
Potassium: 3.7 mmol/L (ref 3.5–5.1)
Sodium: 131 mmol/L — ABNORMAL LOW (ref 135–145)

## 2023-07-20 LAB — PHOSPHORUS: Phosphorus: 2.9 mg/dL (ref 2.5–4.6)

## 2023-07-20 LAB — MAGNESIUM: Magnesium: 1.8 mg/dL (ref 1.7–2.4)

## 2023-07-20 MED ORDER — BISACODYL 5 MG PO TBEC
5.0000 mg | DELAYED_RELEASE_TABLET | Freq: Every day | ORAL | Status: DC
  Administered 2023-07-20: 5 mg via ORAL
  Filled 2023-07-20 (×3): qty 1

## 2023-07-20 MED ORDER — POLYETHYLENE GLYCOL 3350 17 G PO PACK
34.0000 g | PACK | Freq: Two times a day (BID) | ORAL | Status: DC
  Administered 2023-07-21: 34 g via ORAL
  Filled 2023-07-20 (×5): qty 2

## 2023-07-20 MED ORDER — EZETIMIBE 10 MG PO TABS
10.0000 mg | ORAL_TABLET | Freq: Every day | ORAL | Status: DC
  Administered 2023-07-20 – 2023-07-21 (×2): 10 mg via ORAL
  Filled 2023-07-20 (×4): qty 1

## 2023-07-20 NOTE — TOC Progression Note (Signed)
Transition of Care [facility]) - Progression Note    Patient Details  Name: [name] MRN: [id number] Date of Birth: [birth year]  Transition of Care [facility]) CM/SW Contact  Delilah Shan, LCSWA Phone Number: 07/20/2023, 3:19 PM  Clinical Narrative:     CSW provided SNF bed offers to patient. Patient accepted SNF bed offer with [name]. [facility] bed for patient. [facility] confirmed [facility] can accept patient on Monday if medically ready. CSW will continue to follow and assist with patients dc planning needs.  Expected Discharge Plan: Skilled Nursing Facility Barriers to Discharge: Continued Medical Work up  Expected Discharge Plan and Services In-house Referral: Clinical Social Work     Living arrangements for the past 2 months: Single Family Home                                       Social Determinants of Health (SDOH) Interventions SDOH Screenings   Food Insecurity: No Food Insecurity (07/16/2023)  Housing: Low Risk  (07/16/2023)  Transportation Needs: No Transportation Needs (07/16/2023)  Utilities: Not At Risk (07/16/2023)  Alcohol Screen: Low Risk  (06/13/2023)  Depression (PHQ2-9): High Risk (06/13/2023)  Financial Resource Strain: Low Risk  (03/23/2021)  Physical Activity: Inactive (06/13/2023)  Social Connections: Socially Isolated (06/13/2023)  Stress: Stress Concern Present (06/13/2023)  Tobacco Use: High Risk (07/16/2023)  Health Literacy: Adequate Health Literacy (06/13/2023)    Readmission Risk Interventions     No data to display

## 2023-07-20 NOTE — Plan of Care (Signed)

## 2023-07-20 NOTE — Progress Notes (Signed)
[facility]  - [place] at [facility]   PATIENT NAME: [name]    MR#:  [id number]  DATE OF BIRTH:  January 02, 1946  SUBJECTIVE:  no family at bedside. Patient overall doing well. No issues per RN. Seen by PT. Working on discharge plan.  VITALS:  Blood pressure (!) 134/119, pulse 69, temperature 98.3 F (36.8 C), temperature source Oral, resp. rate 16, height 5\' 7"  (1.702 m), weight 74.4 kg, SpO2 99%.  PHYSICAL EXAMINATION:   GENERAL:  77 y.o.-year-old patient with no acute distress.  LUNGS: Normal breath sounds bilaterally, CARDIOVASCULAR: S1, S2 normal. No murmur   ABDOMEN: Soft, nontender, non distended EXTREMITIES: No  edema b/l.    NEUROLOGIC: nonfocal  patient is alert and awake SKIN: No obvious rash, lesion, or ulcer. Right groin incision OK  LABORATORY PANEL:  CBC Recent Labs  Lab 07/19/23 0325  WBC 7.2  HGB 9.9*  HCT 31.8*  PLT 217    Chemistries  Recent Labs  Lab 07/19/23 1051 07/20/23 0250  NA  --  131*  K  --  3.7  CL  --  99  CO2  --  26  GLUCOSE  --  99  BUN  --  13  CREATININE  --  1.09*  CALCIUM  --  8.5*  MG  --  1.8  AST 15  --   ALT 12  --   ALKPHOS 58  --   BILITOT 0.6  --    Cardiac Enzymes No results for input(s): "TROPONINI" in the last 168 hours. RADIOLOGY:  PERIPHERAL VASCULAR CATHETERIZATION  Result Date: 07/18/2023 Images from the original result were not included.   Patient name: [name]           MRN: [id number]        DOB: November 20, 1945            Sex: female  07/18/2023 Pre-operative Diagnosis: Left lower extremity critical and ischemia with rest pain Post-operative diagnosis:  Same Surgeon:  Victorino Sparrow, MD Procedure Performed: 1.  Left lower extremity lytic catheter recheck 2.  Drug-eluting stenting of the proximal superficial femoral artery 7 x 60 mm Eluvia 3.  Drug-coated balloon angioplasty distal bypass 4 x 40 mm 4.  Moderate sedation time 78 minutes 5.  Contrast volume 75 mL 6.  Manual pressure for  access management   Indications: Patient is a 77 year old female with previous history of SFA to below-knee popliteal artery interposition bypass graft for aneurysmal disease.  She was lost to follow-up and presented to the hospital earlier this week with new onset rest pain.  Imaging demonstrated occlusion of left-sided bypass graft.  Pharmacomechanical thrombolysis was initiated.  She presents today for lytic catheter recheck.  Findings:  Widely patent common femoral artery, profunda.  There is some ostial stenosis of the profunda.  The superficial femoral artery appears to have some mural thrombus proximally and has severe disease for roughly 6 cm prior to ending and greater saphenous vein bypass graft.  There was an area of focal, greater than 90% stenosis.  The bypass graft was widely patent with greater than 70% stenosis immediately proximal to the distal anastomosis.  The anterior tibial artery was atretic.  Two-vessel-peroneal and posterior tibial artery runoff to the foot with reconstitution of the dorsalis pedis artery             Procedure:  The patient was identified in the holding area and taken to room 8.  The patient was then  placed supine on the table and prepped and draped in the usual sterile fashion.  A time out was called.   The case began with removal of the lytic catheter and replacement with wire using Seldinger technique.  Left lower extremity angiography followed from the common femoral artery.  See results above.  I elected to attempt intervention.  The patient was anticoagulated with 5000 units IV heparin.  A 7 x 60 mm Eluvia stent was brought to the field and deployed in the proximal superficial femoral artery to cover what appeared to be thrombus.  This was postdilated with a 6 mm balloon with an excellent result.  Distally, the distal portion of the bypass graft was balloon angioplastied using a 4 x 40 mm balloon.  This was inflated for 3 minutes.  Follow-up angiography demonstrated  excellent result with resolution of the stenosis.  My plan was to close the right arteriotomy site using a ProGlide device but the wire and sheath were inadvertently pulled, necessitating manual pressure.  Protamine was given, as well as multiple agents to lower the patient's blood pressure.  Pressure was held for 65 minutes due to bleeding at the 30-minute mark and 45-minute mark.  Impression: Successful thrombolysis of the previous greater saphenous vein interposition bypass.  Stenting of 90% stenotic lesion in the proximal SFA, balloon angioplasty of the distal bypass graft.   Victorino Sparrow MD [facility] Office: [phone]     Assessment and Plan  77 y.o. female with medical history significant for hypertension, depression, anxiety, emphysema, pulmonary fibrosis, history of CVA, and PAD status post left femoral to below the knee popliteal artery bypass grafting in February 2020 who now presents with pain and discoloration of the distal extremities.  Symptoms ongoing for 2 months.  Patient found to have critical limb ischemia and underwent angiogram which noted occluded left superficial artery requiring thrombectomy of left FSA popliteal bypass on 11/5. Returned back to Cath Lab on 11/6 for DES to proximal superficial femoral artery, drug-coated balloon angioplasty of distal bypass.     Left lower remedy critical ischemia status post thrombectomy of left FSA-popliteal bypass. --Patient underwent procedure on 11/5, perfusion slowly improving.   --Returned back to Cath Lab on 11/6 for DES to proximal superficial femoral artery, drug-coated balloon angioplasty of distal bypass. --Per Vascular : okay to discharge from vascular standpoint. Continue aspirin Plavix.   History of CVA Continue aspirin.  Unable to tolerate statins   Emphysema; pulmonary fibrosis  As needed bronchodilators   Essential hypertension Losartan, Norvasc.  IV as needed   Depression, anxiety   - Continue Cymbalta     AAA; aneurysm of ascending thoracic aorta - Noted on CTA in ED, outpatient follow-up recommended    Resumed PT/OT cleared by vascular   DVT Prophylaxis: Subcutaneous Lovenox  CODE STATUS: Full code Family Communication:   Disposition: TOC for d/c planning to rehab once bed and insurance auth available Level of care: Telemetry Cardiac Status is: Inpatient Remains inpatient appropriate because: awaiting bed and insurance auth    TOTAL TIME TAKING CARE OF THIS PATIENT: 35 minutes.  >50% time spent on counselling and coordination of care  Note: This dictation was prepared with Dragon dictation along with smaller phrase technology. Any transcriptional errors that result from this process are unintentional.  Enedina Finner M.D    [facility]   CC: Primary care physician; Shade Flood, MD

## 2023-07-20 NOTE — Progress Notes (Signed)
Patient arrived at the unit,CHG bath given,CCMD notified,Vitals taken,no complaints of pain ,pt oriented to the unit

## 2023-07-21 DIAGNOSIS — I743 Embolism and thrombosis of arteries of the lower extremities: Secondary | ICD-10-CM | POA: Diagnosis not present

## 2023-07-21 DIAGNOSIS — I10 Essential (primary) hypertension: Secondary | ICD-10-CM | POA: Diagnosis not present

## 2023-07-21 DIAGNOSIS — J432 Centrilobular emphysema: Secondary | ICD-10-CM | POA: Diagnosis not present

## 2023-07-21 DIAGNOSIS — I739 Peripheral vascular disease, unspecified: Secondary | ICD-10-CM | POA: Diagnosis not present

## 2023-07-21 LAB — BASIC METABOLIC PANEL
Anion gap: 4 — ABNORMAL LOW (ref 5–15)
BUN: 13 mg/dL (ref 8–23)
CO2: 29 mmol/L (ref 22–32)
Calcium: 8.8 mg/dL — ABNORMAL LOW (ref 8.9–10.3)
Chloride: 98 mmol/L (ref 98–111)
Creatinine, Ser: 0.93 mg/dL (ref 0.44–1.00)
GFR, Estimated: >60 mL/min (ref >60)
Glucose, Bld: 101 mg/dL — ABNORMAL HIGH (ref 70–99)
Potassium: 3.5 mmol/L (ref 3.5–5.1)
Sodium: 131 mmol/L — ABNORMAL LOW (ref 135–145)

## 2023-07-21 NOTE — Progress Notes (Signed)
[facility]  - [place] at [facility]   PATIENT NAME: [name]    MR#:  [id number]  DATE OF BIRTH:  May 10, 1946  SUBJECTIVE:  no family at bedside. Patient overall doing well. No issues per RN. Seen by PT. Working on discharge plan.  VITALS:  Blood pressure (!) 148/73, pulse 71, temperature 99 F (37.2 C), temperature source Oral, resp. rate 16, height 5\' 7"  (1.702 m), weight 74.4 kg, SpO2 95%.  PHYSICAL EXAMINATION:   GENERAL:  77 y.o.-year-old patient with no acute distress.  LUNGS: Normal breath sounds bilaterally, CARDIOVASCULAR: S1, S2 normal. No murmur   ABDOMEN: Soft, nontender, non distended EXTREMITIES: No  edema b/l.    NEUROLOGIC: nonfocal  patient is alert and awake SKIN: No obvious rash, lesion, or ulcer. Right groin incision OK  LABORATORY PANEL:  CBC Recent Labs  Lab 07/19/23 0325  WBC 7.2  HGB 9.9*  HCT 31.8*  PLT 217    Chemistries  Recent Labs  Lab 07/19/23 1051 07/20/23 0250 07/21/23 0252  NA  --  131* 131*  K  --  3.7 3.5  CL  --  99 98  CO2  --  26 29  GLUCOSE  --  99 101*  BUN  --  13 13  CREATININE  --  1.09* 0.93  CALCIUM  --  8.5* 8.8*  MG  --  1.8  --   AST 15  --   --   ALT 12  --   --   ALKPHOS 58  --   --   BILITOT 0.6  --   --    Cardiac Enzymes No results for input(s): "TROPONINI" in the last 168 hours. RADIOLOGY:  No results found.  Assessment and Plan  77 y.o. female with medical history significant for hypertension, depression, anxiety, emphysema, pulmonary fibrosis, history of CVA, and PAD status post left femoral to below the knee popliteal artery bypass grafting in February 2020 who now presents with pain and discoloration of the distal extremities.  Symptoms ongoing for 2 months.  Patient found to have critical limb ischemia and underwent angiogram which noted occluded left superficial artery requiring thrombectomy of left FSA popliteal bypass on 11/5. Returned back to Cath Lab on 11/6 for DES to  proximal superficial femoral artery, drug-coated balloon angioplasty of distal bypass.     Left lower remedy critical ischemia status post thrombectomy of left FSA-popliteal bypass. --Patient underwent procedure on 11/5, perfusion slowly improving.   --Returned back to Cath Lab on 11/6 for DES to proximal superficial femoral artery, drug-coated balloon angioplasty of distal bypass. --Per Vascular : okay to discharge from vascular standpoint. Continue aspirin Plavix.   History of CVA Continue aspirin.  Unable to tolerate statins   Emphysema; pulmonary fibrosis  As needed bronchodilators   Essential hypertension Losartan, Norvasc.  IV as needed   Depression, anxiety  - Continue Cymbalta     AAA; aneurysm of ascending thoracic aorta - Noted on CTA in ED, outpatient follow-up recommended    Resumed PT/OT cleared by vascular   DVT Prophylaxis: Subcutaneous Lovenox  CODE STATUS: Full code Family Communication:   Disposition: TOC for d/c planning to rehab once bed and insurance auth available Level of care: Telemetry Cardiac Status is: Inpatient Remains inpatient appropriate because: awaiting bed and insurance auth    TOTAL TIME TAKING CARE OF THIS PATIENT: 35 minutes.  >50% time spent on counselling and coordination of care  Note: This dictation was prepared with Dragon dictation  along with smaller phrase technology. Any transcriptional errors that result from this process are unintentional.  Enedina Finner M.D    [facility]   CC: Primary care physician; Shade Flood, MD

## 2023-07-21 NOTE — Progress Notes (Signed)
RE: [place] [facility] Date of Birth: 04/13/1946 Date: 07/21/23  Please be advised that the above-named patient will require a short-term nursing home stay - anticipated 30 days or less for rehabilitation and strengthening.  The plan is for return home.

## 2023-07-21 NOTE — Plan of Care (Signed)

## 2023-07-21 NOTE — Plan of Care (Signed)

## 2023-07-22 DIAGNOSIS — I743 Embolism and thrombosis of arteries of the lower extremities: Secondary | ICD-10-CM | POA: Diagnosis not present

## 2023-07-22 DIAGNOSIS — J432 Centrilobular emphysema: Secondary | ICD-10-CM | POA: Diagnosis not present

## 2023-07-22 DIAGNOSIS — I739 Peripheral vascular disease, unspecified: Secondary | ICD-10-CM | POA: Diagnosis not present

## 2023-07-22 DIAGNOSIS — I10 Essential (primary) hypertension: Secondary | ICD-10-CM | POA: Diagnosis not present

## 2023-07-22 LAB — BASIC METABOLIC PANEL
Anion gap: 7 (ref 5–15)
BUN: 15 mg/dL (ref 8–23)
CO2: 26 mmol/L (ref 22–32)
Calcium: 8.9 mg/dL (ref 8.9–10.3)
Chloride: 101 mmol/L (ref 98–111)
Creatinine, Ser: 0.83 mg/dL (ref 0.44–1.00)
GFR, Estimated: >60 mL/min (ref >60)
Glucose, Bld: 109 mg/dL — ABNORMAL HIGH (ref 70–99)
Potassium: 3.8 mmol/L (ref 3.5–5.1)
Sodium: 134 mmol/L — ABNORMAL LOW (ref 135–145)

## 2023-07-22 NOTE — Progress Notes (Signed)
[facility]  - [place] at [facility]   PATIENT NAME: [name]    MR#:  [id number]  DATE OF BIRTH:  12-13-1945  SUBJECTIVE:  no family at bedside. Patient overall doing well. Per RN pt refused to take all her meds at one time  Seen by PT. VITALS:  Blood pressure (!) 141/67, pulse 64, temperature 99 F (37.2 C), temperature source Oral, resp. rate 20, height 5\' 7"  (1.702 m), weight 74.4 kg, SpO2 97%.  PHYSICAL EXAMINATION:   GENERAL:  77 y.o.-year-old patient with no acute distress.  LUNGS: Normal breath sounds bilaterally, CARDIOVASCULAR: S1, S2 normal. No murmur   ABDOMEN: Soft, nontender, non distended EXTREMITIES: No  edema b/l.    NEUROLOGIC: nonfocal  patient is alert and awake SKIN: No obvious rash, lesion, or ulcer. Right groin incision OK  LABORATORY PANEL:  CBC Recent Labs  Lab 07/19/23 0325  WBC 7.2  HGB 9.9*  HCT 31.8*  PLT 217    Chemistries  Recent Labs  Lab 07/19/23 1051 07/20/23 0250 07/21/23 0252 07/22/23 0645  NA  --  131*   < > 134*  K  --  3.7   < > 3.8  CL  --  99   < > 101  CO2  --  26   < > 26  GLUCOSE  --  99   < > 109*  BUN  --  13   < > 15  CREATININE  --  1.09*   < > 0.83  CALCIUM  --  8.5*   < > 8.9  MG  --  1.8  --   --   AST 15  --   --   --   ALT 12  --   --   --   ALKPHOS 58  --   --   --   BILITOT 0.6  --   --   --    < > = values in this interval not displayed.   Cardiac Enzymes No results for input(s): "TROPONINI" in the last 168 hours. RADIOLOGY:  No results found.  Assessment and Plan  77 y.o. female with medical history significant for hypertension, depression, anxiety, emphysema, pulmonary fibrosis, history of CVA, and PAD status post left femoral to below the knee popliteal artery bypass grafting in February 2020 who now presents with pain and discoloration of the distal extremities.  Symptoms ongoing for 2 months.  Patient found to have critical limb ischemia and underwent angiogram which noted  occluded left superficial artery requiring thrombectomy of left FSA popliteal bypass on 11/5. Returned back to Cath Lab on 11/6 for DES to proximal superficial femoral artery, drug-coated balloon angioplasty of distal bypass.     Left lower remedy critical ischemia status post thrombectomy of left FSA-popliteal bypass. --Patient underwent procedure on 11/5, perfusion slowly improving.   --Returned back to Cath Lab on 11/6 for DES to proximal superficial femoral artery, drug-coated balloon angioplasty of distal bypass. --Per Vascular : okay to discharge from vascular standpoint. Continue aspirin Plavix.   History of CVA Continue aspirin.  Unable to tolerate statins   Emphysema; pulmonary fibrosis  As needed bronchodilators   Essential hypertension Losartan, Norvasc.  IV as needed   Depression, anxiety  - Continue Cymbalta     AAA; aneurysm of ascending thoracic aorta - Noted on CTA in ED, outpatient follow-up recommended    Resumed PT/OT cleared by vascular   DVT Prophylaxis: Subcutaneous Lovenox  CODE STATUS: Full code Family  Communication:   Disposition: TOC for d/c planning to rehab once bed and insurance auth available Level of care: Telemetry Cardiac Status is: Inpatient Remains inpatient appropriate because: awaiting bed and insurance auth    TOTAL TIME TAKING CARE OF THIS PATIENT: 35 minutes.  >50% time spent on counselling and coordination of care  Note: This dictation was prepared with Dragon dictation along with smaller phrase technology. Any transcriptional errors that result from this process are unintentional.  Enedina Finner M.D    [facility]   CC: Primary care physician; Shade Flood, MD

## 2023-07-22 NOTE — Plan of Care (Signed)

## 2023-07-23 DIAGNOSIS — I739 Peripheral vascular disease, unspecified: Secondary | ICD-10-CM | POA: Diagnosis not present

## 2023-07-23 DIAGNOSIS — F419 Anxiety disorder, unspecified: Secondary | ICD-10-CM | POA: Diagnosis not present

## 2023-07-23 DIAGNOSIS — E860 Dehydration: Secondary | ICD-10-CM | POA: Diagnosis present

## 2023-07-23 DIAGNOSIS — L89151 Pressure ulcer of sacral region, stage 1: Secondary | ICD-10-CM | POA: Diagnosis not present

## 2023-07-23 DIAGNOSIS — N1832 Chronic kidney disease, stage 3b: Secondary | ICD-10-CM | POA: Diagnosis present

## 2023-07-23 DIAGNOSIS — I70222 Atherosclerosis of native arteries of extremities with rest pain, left leg: Secondary | ICD-10-CM | POA: Diagnosis not present

## 2023-07-23 DIAGNOSIS — Z86718 Personal history of other venous thrombosis and embolism: Secondary | ICD-10-CM | POA: Diagnosis not present

## 2023-07-23 DIAGNOSIS — K5900 Constipation, unspecified: Secondary | ICD-10-CM | POA: Diagnosis not present

## 2023-07-23 DIAGNOSIS — R9089 Other abnormal findings on diagnostic imaging of central nervous system: Secondary | ICD-10-CM | POA: Diagnosis not present

## 2023-07-23 DIAGNOSIS — L409 Psoriasis, unspecified: Secondary | ICD-10-CM | POA: Diagnosis not present

## 2023-07-23 DIAGNOSIS — G2581 Restless legs syndrome: Secondary | ICD-10-CM | POA: Diagnosis not present

## 2023-07-23 DIAGNOSIS — J439 Emphysema, unspecified: Secondary | ICD-10-CM | POA: Diagnosis not present

## 2023-07-23 DIAGNOSIS — R471 Dysarthria and anarthria: Secondary | ICD-10-CM | POA: Diagnosis not present

## 2023-07-23 DIAGNOSIS — I69322 Dysarthria following cerebral infarction: Secondary | ICD-10-CM | POA: Diagnosis not present

## 2023-07-23 DIAGNOSIS — Z7401 Bed confinement status: Secondary | ICD-10-CM | POA: Diagnosis not present

## 2023-07-23 DIAGNOSIS — K219 Gastro-esophageal reflux disease without esophagitis: Secondary | ICD-10-CM | POA: Diagnosis present

## 2023-07-23 DIAGNOSIS — I70228 Atherosclerosis of native arteries of extremities with rest pain, other extremity: Secondary | ICD-10-CM | POA: Diagnosis not present

## 2023-07-23 DIAGNOSIS — I743 Embolism and thrombosis of arteries of the lower extremities: Secondary | ICD-10-CM | POA: Diagnosis not present

## 2023-07-23 DIAGNOSIS — I70722 Atherosclerosis of other type of bypass graft(s) of the extremities with rest pain, left leg: Secondary | ICD-10-CM | POA: Diagnosis not present

## 2023-07-23 DIAGNOSIS — I129 Hypertensive chronic kidney disease with stage 1 through stage 4 chronic kidney disease, or unspecified chronic kidney disease: Secondary | ICD-10-CM | POA: Diagnosis not present

## 2023-07-23 DIAGNOSIS — Z9981 Dependence on supplemental oxygen: Secondary | ICD-10-CM | POA: Diagnosis not present

## 2023-07-23 DIAGNOSIS — N3000 Acute cystitis without hematuria: Secondary | ICD-10-CM | POA: Diagnosis present

## 2023-07-23 DIAGNOSIS — E038 Other specified hypothyroidism: Secondary | ICD-10-CM | POA: Diagnosis present

## 2023-07-23 DIAGNOSIS — Z79899 Other long term (current) drug therapy: Secondary | ICD-10-CM | POA: Diagnosis not present

## 2023-07-23 DIAGNOSIS — Z66 Do not resuscitate: Secondary | ICD-10-CM | POA: Diagnosis present

## 2023-07-23 DIAGNOSIS — J8410 Pulmonary fibrosis, unspecified: Secondary | ICD-10-CM | POA: Diagnosis not present

## 2023-07-23 DIAGNOSIS — I714 Abdominal aortic aneurysm, without rupture, unspecified: Secondary | ICD-10-CM | POA: Diagnosis not present

## 2023-07-23 DIAGNOSIS — Z7902 Long term (current) use of antithrombotics/antiplatelets: Secondary | ICD-10-CM | POA: Diagnosis not present

## 2023-07-23 DIAGNOSIS — R5381 Other malaise: Secondary | ICD-10-CM | POA: Diagnosis not present

## 2023-07-23 DIAGNOSIS — N183 Chronic kidney disease, stage 3 unspecified: Secondary | ICD-10-CM | POA: Diagnosis not present

## 2023-07-23 DIAGNOSIS — I10 Essential (primary) hypertension: Secondary | ICD-10-CM | POA: Diagnosis not present

## 2023-07-23 DIAGNOSIS — R531 Weakness: Secondary | ICD-10-CM | POA: Diagnosis not present

## 2023-07-23 DIAGNOSIS — I700 Atherosclerosis of aorta: Secondary | ICD-10-CM | POA: Diagnosis not present

## 2023-07-23 DIAGNOSIS — I69351 Hemiplegia and hemiparesis following cerebral infarction affecting right dominant side: Secondary | ICD-10-CM | POA: Diagnosis not present

## 2023-07-23 DIAGNOSIS — F32A Depression, unspecified: Secondary | ICD-10-CM | POA: Diagnosis not present

## 2023-07-23 DIAGNOSIS — Z7982 Long term (current) use of aspirin: Secondary | ICD-10-CM | POA: Diagnosis not present

## 2023-07-23 DIAGNOSIS — L89159 Pressure ulcer of sacral region, unspecified stage: Secondary | ICD-10-CM | POA: Diagnosis not present

## 2023-07-23 DIAGNOSIS — I639 Cerebral infarction, unspecified: Secondary | ICD-10-CM | POA: Diagnosis not present

## 2023-07-23 DIAGNOSIS — I82412 Acute embolism and thrombosis of left femoral vein: Secondary | ICD-10-CM | POA: Diagnosis not present

## 2023-07-23 DIAGNOSIS — R2990 Unspecified symptoms and signs involving the nervous system: Secondary | ICD-10-CM | POA: Diagnosis not present

## 2023-07-23 DIAGNOSIS — R1314 Dysphagia, pharyngoesophageal phase: Secondary | ICD-10-CM | POA: Diagnosis present

## 2023-07-23 DIAGNOSIS — F329 Major depressive disorder, single episode, unspecified: Secondary | ICD-10-CM | POA: Diagnosis present

## 2023-07-23 DIAGNOSIS — Z86011 Personal history of benign neoplasm of the brain: Secondary | ICD-10-CM | POA: Diagnosis not present

## 2023-07-23 DIAGNOSIS — G319 Degenerative disease of nervous system, unspecified: Secondary | ICD-10-CM | POA: Diagnosis not present

## 2023-07-23 DIAGNOSIS — R29810 Facial weakness: Secondary | ICD-10-CM | POA: Diagnosis present

## 2023-07-23 DIAGNOSIS — E785 Hyperlipidemia, unspecified: Secondary | ICD-10-CM | POA: Diagnosis not present

## 2023-07-23 MED ORDER — LINACLOTIDE 290 MCG PO CAPS: 290.0000 ug | ORAL_CAPSULE | Freq: Every day | ORAL | Status: AC

## 2023-07-23 MED ORDER — EZETIMIBE 10 MG PO TABS: 10.0000 mg | ORAL_TABLET | Freq: Every day | ORAL | Status: AC

## 2023-07-23 MED ORDER — ACETAMINOPHEN 325 MG PO TABS: 650.0000 mg | ORAL_TABLET | Freq: Four times a day (QID) | ORAL | Status: DC | PRN

## 2023-07-23 MED ORDER — CLOPIDOGREL BISULFATE 75 MG PO TABS: 75.0000 mg | ORAL_TABLET | Freq: Every day | ORAL | Status: AC

## 2023-07-23 NOTE — Progress Notes (Signed)
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 11/5 aortogram and angiogram with thrombectomy. 11/6 s/p L SFA stent and angioplasty. PMHx: CKD, psoriasis, bipolar disorder, HTN, pulmonary fibrosis, CVA, meningioma, hypothyroidism, CAD, RLS, HLD, left fem-pop BPG    PT Comments  Pt pleasant and excitable for therapy. Pt able to ambulate 156ft in 3 trials of increasing distance 40ft-30ft-55ft with minA and chair follow. Pt rquired seated rest breaks between bouts of ambulating. ModA STS OOB and minA to rise from recliner. Pt reported being accepted into SNF, will continue to follow acutely.     If plan is discharge home, recommend the following: A lot of help with walking and/or transfers;A lot of help with bathing/dressing/bathroom;Assist for transportation;Assistance with cooking/housework;Help with stairs or ramp for entrance   Can travel by private vehicle     Yes  Equipment Recommendations  BSC/3in1    Recommendations for Other Services       Precautions / Restrictions Precautions Precautions: Fall Restrictions Weight Bearing Restrictions: No     Mobility  Bed Mobility Overal bed mobility: Needs Assistance Bed Mobility: Supine to Sit     Supine to sit: Used rails, Min assist, HOB elevated          Transfers Overall transfer level: Needs assistance Equipment used: Rolling walker (2 wheels) Transfers: Sit to/from Stand Sit to Stand: Mod assist           General transfer comment: modA with cues for hand placement to rise from bed, minA with cues for hand placement to rise from recliner    Ambulation/Gait Ambulation/Gait assistance: Min assist, +2 safety/equipment Gait Distance (Feet): 100 Feet Assistive device:  Rolling walker (2 wheels) Gait Pattern/deviations: Step-through pattern, Decreased stride length, Trunk flexed   Gait velocity interpretation: <1.8 ft/sec, indicate of risk for recurrent falls   General Gait Details: cues for posture and proximity to RW, close chair follow, 3 trials increased distance each trial, 69ft-30ft-55ft   Stairs             Wheelchair Mobility     Tilt Bed    Modified Rankin (Stroke Patients Only)       Balance Overall balance assessment: Needs assistance Sitting-balance support: Feet supported Sitting balance-Leahy Scale: Fair     Standing balance support: Reliant on assistive device for balance, During functional activity Standing balance-Leahy Scale: Poor Standing balance comment: RW in standing                            Cognition Arousal: Alert Behavior During Therapy: WFL for tasks assessed/performed Overall Cognitive Status: Within Functional Limits for tasks assessed                                 General Comments: pleasent and conversational throughout session        Exercises General Exercises - Lower Extremity Long Arc Quad: AROM, Both, Seated, 10 reps    General Comments        Pertinent Vitals/Pain Pain Assessment Pain Assessment: No/denies pain    Home Living  Prior Function            PT Goals (current goals can now be found in the care plan section) Progress towards PT goals: Progressing toward goals    Frequency    Min 1X/week      PT Plan      Co-evaluation              AM-PAC PT "6 Clicks" Mobility   Outcome Measure  Help needed turning from your back to your side while in a flat bed without using bedrails?: A Little Help needed moving from lying on your back to sitting on the side of a flat bed without using bedrails?: A Little Help needed moving to and from a bed to a chair (including a wheelchair)?: A Little Help needed  standing up from a chair using your arms (e.g., wheelchair or bedside chair)?: A Little Help needed to walk in hospital room?: A Lot Help needed climbing 3-5 steps with a railing? : Total 6 Click Score: 15    End of Session Equipment Utilized During Treatment: Gait belt Activity Tolerance: Patient tolerated treatment well Patient left: in chair;with call bell/phone within reach;with chair alarm set Nurse Communication: Mobility status PT Visit Diagnosis: Other abnormalities of gait and mobility (R26.89);Difficulty in walking, not elsewhere classified (R26.2)     Time: 7846-9629 PT Time Calculation (min) (ACUTE ONLY): 28 min  Charges:    $Gait Training: 8-22 mins $Therapeutic Activity: 8-22 mins PT General Charges $$ ACUTE PT VISIT: 1 Visit                     Andrey Farmer. SPT Secure chat preferred    Darlin Drop 07/23/2023, 10:28 AM

## 2023-07-23 NOTE — TOC Progression Note (Addendum)
Transition of Care [facility]) - Progression Note    Patient Details  Name: [name] MRN: [id number] Date of Birth: 09/16/1945  [name]) CM/SW Contact  Delilah Shan, LCSWA Phone Number: 07/23/2023, 10:59 AM  Clinical Narrative:     Patients passr currently pending. CSW uploaded requested clinicals to [name] must for review. CSW awaiting determination. Patient has SNF bed at [name]. CSW will continue to follow and assist with patients dc planning needs  Update-11:32am- Patients Passr was approved [id number] . CSW spoke with S[facility] at [name] who confirmed facility can accept patient today if medically ready. CSW informed MD.   Expected Discharge Plan: Skilled Nursing Facility Barriers to Discharge: Continued Medical Work up  Expected Discharge Plan and Services In-house Referral: Clinical Social Work     Living arrangements for the past 2 months: Single Family Home                                       Social Determinants of Health (SDOH) Interventions SDOH Screenings   Food Insecurity: No Food Insecurity (07/16/2023)  Housing: Low Risk  (07/16/2023)  Transportation Needs: No Transportation Needs (07/16/2023)  Utilities: Not At Risk (07/16/2023)  Alcohol Screen: Low Risk  (06/13/2023)  Depression (PHQ2-9): High Risk (06/13/2023)  Financial Resource Strain: Low Risk  (03/23/2021)  Physical Activity: Inactive (06/13/2023)  Social Connections: Socially Isolated (06/13/2023)  Stress: Stress Concern Present (06/13/2023)  Tobacco Use: High Risk (07/16/2023)  Health Literacy: Adequate Health Literacy (06/13/2023)    Readmission Risk Interventions     No data to display

## 2023-07-23 NOTE — Care Management Important Message (Signed)
Important Message  Patient Details  Name: [name] MRN: [id number] Date of Birth: [birth year]   Important Message Given:  Yes - Medicare IM     Sherilyn Banker 07/23/2023, 2:56 PM

## 2023-07-23 NOTE — Discharge Summary (Signed)
Physician Discharge Summary  [name]:[id number] DOB: Jan 18, 1946 DOA: 07/16/2023  PCP: Shade Flood, MD  Admit date: 07/16/2023 Discharge date: 07/23/2023 Recommendations for Outpatient Follow-up:  Follow up with PCP in 1 weeks-call for appointment Please obtain BMP/CBC in one week  Discharge Dispo: SNF Discharge Condition: Stable Code Status:   Code Status: Full Code Diet recommendation:  Diet Order             Diet regular Room service appropriate? Yes; Fluid consistency: Thin  Diet effective now                    Brief/Interim Summary: 77 y.o. female with medical history significant for hypertension, depression, anxiety, emphysema, pulmonary fibrosis, history of CVA, and PAD status post left femoral to below the knee popliteal artery bypass grafting in February 2020 who now presents with pain and discoloration of the distal extremities.  Symptoms ongoing for 2 months.  Patient found to have critical limb ischemia and underwent angiogram which noted occluded left superficial artery requiring thrombectomy of left FSA popliteal bypass on 11/5. Returned back to Cath Lab on 11/6 for DES to proximal superficial femoral artery, drug-coated balloon angioplasty of distal bypass  Postop doing well after lysis of LLE bypass with proximal SFA stenting.  Advised to continue aspirin Plavix and statin and cleared for discharge by vascular.  PT OT recommending skilled nursing facility placement.  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  Advised to continue aspirin Plavix and statin and cleared for discharge by vascular.     History of CVA Continue aspirin.  Not able to tolerate statin   Emphysema; pulmonary fibrosis   Cont prn nebs   Essential hypertension BP is well-controlled on losartan, Norvasc   Depression, anxiety  Mood stable,continue Cymbalta     AAA; aneurysm of ascending thoracic aorta Noted on CTA in ED, outpatient follow-up recommended  Pressure ulcer: Sacrum stage I POA-continue wound care Pressure Injury 07/17/23 Sacrum Mid Stage 1 -  Intact skin with non-blanchable redness of a localized area usually over a bony prominence. (Active)  07/17/23 1700  Location: Sacrum  Location Orientation: Mid  Staging: Stage 1 -  Intact skin with non-blanchable redness of a localized area usually over a bony prominence.  Wound Description (Comments):   Present on Admission: Yes  Dressing Type Foam - Lift dressing to assess site every shift 07/22/23 1940    Consults: Vascular surgery Subjective: Alert awake oriented resting comfortably, waiting for placement  Discharge Exam: Vitals:   07/23/23 0902 07/23/23 1021  BP: (!) 142/82 (!) 142/82  Pulse: 89   Resp: 17   Temp: 98 F (36.7 C)   SpO2: 97%    General: Pt is alert, awake, not in acute distress Cardiovascular: RRR, S1/S2 +, no rubs, no gallops Respiratory: CTA bilaterally, no wheezing, no rhonchi  Abdominal: Soft, NT, ND, bowel sounds + Extremities: no edema, no cyanosis  Discharge Instructions  Discharge Instructions     Discharge instructions   Complete by: As directed    Follow-up with vascular surgery for postop care as instructed  Please call call MD or return to ER for similar or worsening  recurring problem that brought you to hospital or if any fever,nausea/vomiting,abdominal pain, uncontrolled pain, chest pain,  shortness of breath or any other alarming symptoms.  Please follow-up your doctor as instructed in a week time and call the office for appointment.  Please avoid alcohol, smoking, or any other illicit substance and maintain healthy habits including taking your regular medications as prescribed.  You were cared for by a hospitalist during your hospital stay. If you have any questions about your discharge medications or the care you received while you were in the hospital after you are discharged, you can call the unit and ask to speak with the hospitalist on call if the hospitalist that took care of you is not available.  Once you are discharged, your primary care physician will handle any further medical issues. Please note that NO REFILLS for any discharge medications will be authorized once you are discharged, as it is imperative that you return to your primary care physician (or establish a relationship with a primary care physician if you do not have one) for your aftercare needs so that they can reassess your need for medications and monitor your lab values   Discharge wound care:   Complete by: As directed    Apply off loading dressing over sacrum   Increase activity slowly   Complete by: As directed       Allergies as of 07/23/2023       Reactions   Lipitor [atorvastatin] Other (See Comments)   Self reported myalgias   Pravastatin Other (See Comments)   myalgias   Rosuvastatin Other (See Comments)   Self reported myalgias        Medication List     TAKE these medications    acetaminophen 325 MG tablet Commonly known as: TYLENOL Take 2 tablets (650 mg total) by mouth every 6 (six) hours as needed for headache or mild pain (pain score 1-3).   albuterol 108 (90 Base) MCG/ACT inhaler Commonly known as: Ventolin HFA Inhale 1-2 puffs into the lungs  every 6 (six) hours as needed for wheezing or shortness of breath.   amLODipine 5 MG tablet Commonly known as: NORVASC Take 1 tablet (5 mg total) by mouth daily.   aspirin EC 81 MG tablet Take 81 mg by mouth daily. Swallow whole.   clopidogrel 75 MG tablet Commonly known as: PLAVIX Take 1 tablet (75 mg total) by mouth daily with breakfast. Start taking on: July 24, 2023   DULoxetine 30 MG capsule Commonly known as: CYMBALTA Take 1 capsule (30 mg total) by mouth daily.   ezetimibe 10 MG tablet Commonly known as: ZETIA Take 1 tablet (10 mg total) by mouth daily. Start taking on: July 24, 2023   linaclotide 290 MCG Caps capsule Commonly known as: LINZESS Take 1 capsule (290 mcg total) by mouth daily before breakfast.   losartan 25 MG tablet Commonly known as: COZAAR Take 1 tablet (25 mg total) by mouth daily.   polyethylene glycol 17 g packet Commonly known as: MIRALAX / GLYCOLAX Take 34 g by mouth daily.   PRESCRIPTION MEDICATION Take 1 capsule by mouth daily. Prokinetic. Unknown name and strength.   rOPINIRole  2 MG tablet Commonly known as: REQUIP TAKE 1 TABLET(2 MG) BY MOUTH AT BEDTIME What changed: See the new instructions.   simethicone 80 MG chewable tablet Commonly known as: MYLICON Chew 1 tablet (80 mg total) by mouth 4 (four) times daily.               Discharge Care Instructions  (From admission, onward)           Start     Ordered   07/23/23 0000  Discharge wound care:       Comments: Apply off loading dressing over sacrum   07/23/23 1156            Follow-up Information     [facility] Follow up in 4 week(s).   Specialty: Vascular Surgery Why: Office will call to arrange your appt(s) (sent) Contact information: [address] [place] [address] [phone]        Shade Flood, MD Follow up in 1 week(s).   Specialties: Family Medicine, Sports  Medicine Contact information: [address] [place] [address] [phone]                Allergies  Allergen Reactions   Lipitor [Atorvastatin] Other (See Comments)    Self reported myalgias   Pravastatin Other (See Comments)    myalgias   Rosuvastatin Other (See Comments)    Self reported myalgias    The results of significant diagnostics from this hospitalization (including imaging, microbiology, ancillary and laboratory) are listed below for reference.    Microbiology: Recent Results (from the past 240 hour(s))  MRSA Next Gen by PCR, Nasal     Status: None   Collection Time: 07/17/23  5:06 PM   Specimen: Nasal Mucosa; Nasal Swab  Result Value Ref Range Status   MRSA by PCR Next Gen NOT DETECTED NOT DETECTED Final    Comment: (NOTE) The GeneXpert MRSA Assay (FDA approved for NASAL specimens only), is one component of a comprehensive MRSA colonization surveillance program. It is not intended to diagnose MRSA infection nor to guide or monitor treatment for MRSA infections. Test performance is not FDA approved in patients less than 35 years old. Performed at [facility], [address], [place], [place] [address]     Procedures/Studies: PERIPHERAL VASCULAR CATHETERIZATION  Result Date: 07/18/2023 Images from the original result were not included.   Patient name: [name]           MRN: [id number]        DOB: February 10, 1946            Sex: female  07/18/2023 Pre-operative Diagnosis: Left lower extremity critical and ischemia with rest pain Post-operative diagnosis:  Same Surgeon:  Victorino Sparrow, MD Procedure Performed: 1.  Left lower extremity lytic catheter recheck 2.  Drug-eluting stenting of the proximal superficial femoral artery 7 x 60 mm Eluvia 3.  Drug-coated balloon angioplasty distal bypass 4 x 40 mm 4.  Moderate sedation time 78 minutes 5.  Contrast volume 75 mL 6.  Manual pressure for access management   Indications: Patient is a 77 year old female  with previous history of SFA to below-knee popliteal artery interposition bypass graft for aneurysmal disease.  She was lost to follow-up and presented to the hospital earlier this week with new onset rest pain.  Imaging demonstrated occlusion of left-sided bypass graft.  Pharmacomechanical thrombolysis was initiated.  She presents today for lytic catheter recheck.  Findings:  Widely patent common femoral artery, profunda.  There is some ostial stenosis of the profunda.  The superficial femoral artery appears to have some mural thrombus proximally and has severe disease for roughly 6 cm prior to ending and greater saphenous vein bypass graft.  There was an area of focal, greater than 90% stenosis.  The bypass graft was widely patent with greater than 70% stenosis immediately proximal to the distal anastomosis.  The anterior tibial artery was atretic.  Two-vessel-peroneal and posterior tibial artery runoff to the foot with reconstitution of the dorsalis pedis artery             Procedure:  The patient was identified in the holding area and taken to room 8.  The patient was then placed supine on the table and prepped and draped in the usual sterile fashion.  A time out was called.   The case began with removal of the lytic catheter and replacement with wire using Seldinger technique.  Left lower extremity angiography followed from the common femoral artery.  See results above.  I elected to attempt intervention.  The patient was anticoagulated with 5000 units IV heparin.  A 7 x 60 mm Eluvia stent was brought to the field and deployed in the proximal superficial femoral artery to cover what appeared to be thrombus.  This was postdilated with a 6 mm balloon with an excellent result.  Distally, the distal portion of the bypass graft was balloon angioplastied using a 4 x 40 mm balloon.  This was inflated for 3 minutes.  Follow-up angiography demonstrated excellent result with resolution of the stenosis.  My plan was to  close the right arteriotomy site using a ProGlide device but the wire and sheath were inadvertently pulled, necessitating manual pressure.  Protamine was given, as well as multiple agents to lower the patient's blood pressure.  Pressure was held for 65 minutes due to bleeding at the 30-minute mark and 45-minute mark.  Impression: Successful thrombolysis of the previous greater saphenous vein interposition bypass.  Stenting of 90% stenotic lesion in the proximal SFA, balloon angioplasty of the distal bypass graft.   Victorino Sparrow MD [facility] Office: [phone]    PERIPHERAL VASCULAR CATHETERIZATION  Result Date: 07/17/2023 Images from the original result were not included. Patient name: SAVEA FULD MRN: [id number] DOB: 1946/06/11 Sex: female 07/17/2023 Pre-operative Diagnosis: left leg ischemia Post-operative diagnosis:  Same Surgeon:  [name] Procedure Performed:  1.  U/s Guided access  2.  Aortogram with bifemoral runoff  3.  Left leg angiogram  4.  Selective injection with catheter in right popliteal artery  5.  Mechanical thrombectomy of left superficial femoral artery and above-knee popliteal to below-knee popliteal bypass graft  6.  Initiation of thrombolytic therapy  7.  Conscious sedation, 54 minutes Indications: This is a 77 year old female with history of left above-knee to below-knee popliteal artery bypass graft for rest pain in 2020.  She has a history of stroke in June which was an ischemic stroke.  She has developed discoloration to the toes on her left foot as well as rest pain.  Imaging has revealed her bypass graft is occluded.  She comes in today for further evaluation and possible intervention Procedure:  The patient was identified in the holding area and taken to room 8.  The patient was then placed supine on the table and prepped and draped in the usual sterile fashion.  A time out was called.  Conscious sedation  was administered with the use of IV  fentanyl and Versed under continuous physician and nurse monitoring.  Heart rate, blood pressure, and oxygen saturation were continuously monitored.  Total sedation time was 54 minutes.  Ultrasound was used to evaluate the right common femoral artery.  It was patent .  A digital ultrasound image was acquired.  A micropuncture needle was used to access the right common femoral artery under ultrasound guidance.  An 018 wire was advanced without resistance and a micropuncture sheath was placed.  The 018 wire was removed and a benson wire was placed.  The micropuncture sheath was exchanged for a 5 french sheath.  An omniflush catheter was advanced over the wire to the level of L-1.  An abdominal angiogram was obtained.  Next, using the omniflush catheter and a benson wire, the aortic bifurcation was crossed and the catheter was placed into theleft external iliac artery and left runoff was obtained. Findings:  Aortogram: No significant renal artery stenosis.  The infrarenal abdominal aorta is ectatic but without stenosis.  Bilateral common and external iliac arteries are widely patent.  Bilateral common femoral arteries are widely patent  Right Lower Extremity: The left common femoral and profundofemoral artery are widely patent.  There is a superficial femoral artery occlusion with a small proximal stump.  The superficial femoral artery does not opacify retrograde through the bypass graft.  There is two-vessel runoff on the posterior tibial and peroneal artery  Left Lower Extremity: Not evaluated Intervention: After the above images were acquired the decision was made to proceed with intervention.  A 7 French 45 cm sheath was inserted into the left extrailiac artery.  The patient was fully heparinized.  Next, using a 035 Glidewire and a quick cross catheter, the superficial femoral artery was selected.  The wire was then able to be navigated into the distal superficial femoral artery and through the metallic ring that  represented the proximal anastomosis of the bypass graft.  A selective contrast injection with a catheter in the popliteal artery and the bypass graft was performed confirming successful crossing.  I then placed a long Bentson wire.  I used the penumbra CAT 7 device to perform mechanical thrombectomy of the left superficial femoral artery as well as the left above-knee to below-knee popliteal bypass graft.  Multiple passes were made.  On follow-up imaging there did appear to be some residual thrombus up near the proximal anastomosis.  I contemplated stenting this, however ultimately I decided to place a thrombolytic catheter into the SFA and bypass graft and run her at low-dose tPA overnight to see if this thrombus will resolve.  She will be brought back tomorrow for follow-up study Impression:  #1  Occluded left superficial femoral artery  #2  Mechanical thrombectomy of left superficial femoral artery and above-knee to below-knee popliteal artery bypass graft with penumbra CAT 7  #3  Patient will be brought back tomorrow for follow-up study V. [name], M.D., FACS [facility] Office: [phone] Pager:  [phone]   CT Angio Chest Aorta W and/or Wo Contrast  Result Date: 07/16/2023 CLINICAL DATA:  Peripheral ischemia, dyspnea, dizziness, weakness EXAM: CT ANGIOGRAPHY CHEST WITH CONTRAST TECHNIQUE: Multidetector CT imaging of the chest was performed using the standard protocol during bolus administration of intravenous contrast. Multiplanar CT image reconstructions and MIPs were obtained to evaluate the vascular anatomy. RADIATION DOSE REDUCTION: This exam was performed according to the departmental dose-optimization program which includes automated exposure control, adjustment of  the mA and/or kV according to patient size and/or use of iterative reconstruction technique. CONTRAST:  OMNIPAQUE IOHEXOL 350 MG/ML SOLN, OMNIPAQUE IOHEXOL 350 MG/ML SOLN COMPARISON:   02/13/2023 FINDINGS: Cardiovascular: There is adequate opacification of the pulmonary arterial tree. No intraluminal filling defect identified to suggest acute pulmonary embolism. The central pulmonary arteries are of normal caliber. Mild coronary artery calcification. Global cardiac size is within normal limits. No pericardial effusion. 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 Superimposed moderate atherosclerotic calcification. Ascending aorta is of normal caliber. No superimposed intramural hematoma or dissection. Arch vasculature is widely patent proximally. Mediastinum/Nodes: No enlarged mediastinal, hilar, or axillary lymph nodes. Thyroid gland, trachea, and esophagus demonstrate no significant findings. Lungs/Pleura: Moderate emphysema. Bibasilar pulmonary fibrotic change again noted. Right middle lobe pulmonary nodule, axial image # 69/12, stable since remote prior examinations of 11/17/2019 and safely considered benign. No confluent pulmonary infiltrate. No pneumothorax or pleural effusion. No central obstructing lesion. Upper Abdomen: No acute abnormality. Musculoskeletal: Osseous structures are age-appropriate. No acute bone abnormality. No lytic or blastic bone lesion. Review of the MIP images confirms the above findings. IMPRESSION: 1. No pulmonary embolism. No acute intrathoracic pathology identified. 2. Mild coronary artery calcification. 3. 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 Recommend annual imaging followup by CTA or MRA. This recommendation follows 2010 ACCF/AHA/AATS/ACR/ASA/SCA/SCAI/SIR/STS/SVM Guidelines for the Diagnosis and Management of Patients with Thoracic Aortic Disease. Circulation.2010; 121: Z610-R604. Aortic aneurysm NOS (ICD10-I71.9) 4. Moderate emphysema. 5. Stable bibasilar pulmonary fibrotic  change. Aortic Atherosclerosis (ICD10-I70.0) and Emphysema (ICD10-J43.9). Electronically Signed   By: Helyn Numbers M.D.   On: 07/16/2023 19:59   CT ANGIO AO+BIFEM W & OR WO CONTRAST  Result Date: 07/16/2023 CLINICAL DATA:  Claudication or leg ischemia EXAM: CT ANGIOGRAPHY OF ABDOMINAL AORTA WITH ILIOFEMORAL RUNOFF TECHNIQUE: Multidetector CT imaging of the abdomen, pelvis and lower extremities was performed using the standard protocol during bolus administration of intravenous contrast. Multiplanar CT image reconstructions and MIPs were obtained to evaluate the vascular anatomy. RADIATION DOSE REDUCTION: This exam was performed according to the departmental dose-optimization program which includes automated exposure control, adjustment of the mA and/or kV according to patient size and/or use of iterative reconstruction technique. CONTRAST:  OMNIPAQUE IOHEXOL 350 MG/ML SOLN, OMNIPAQUE IOHEXOL 350 MG/ML SOLN COMPARISON:  09/26/2019 FINDINGS: VASCULAR Aorta: 3.2 x 3.7 cm fusiform infrarenal abdominal aortic aneurysm. Moderate superimposed atherosclerotic calcification. No periaortic inflammatory change or fluid collection identified. Celiac: Variant anatomy with common origin of the celiac axis and superior mesenteric artery. No evidence of hemodynamically significant stenosis. No aneurysm or dissection. SMA: See above Renals: Single right and dual left renal arteries. 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 No evidence of hemodynamically significant stenosis involving the left renal arteries. Normal vascular morphology. No aneurysm or dissection. IMA: Patent without evidence of aneurysm, dissection, vasculitis or significant stenosis. RIGHT Lower Extremity Inflow: Extensive mixed atherosclerotic plaque within the right lower extremity arterial inflow without evidence of hemodynamically significant stenosis. Internal iliac artery is widely patent. No aneurysm or  dissection. Outflow: Diffuse scattered moderate atherosclerotic calcification within the SFA without evidence of hemodynamically significant stenosis. Common femoral artery and profundus femoral arteries are widely patent. Runoff: Short segment dissection of the right P1 popliteal artery. 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 Distal popliteal artery and tibioperoneal  trunk are widely patent. Anterior tibial artery occludes shortly beyond its takeoff. Peroneal artery and posterior tibial artery are patent to the level of the ankle with patency of the plantar arch. Dorsalis pedis artery is occluded. LEFT Lower Extremity Inflow: Scattered atherosclerotic calcification without evidence of hemodynamically significant stenosis involving the left lower extremity arterial inflow. Internal iliac artery is patent at its origin. Outflow: Surgical changes of right superficial femoral arterial-popliteal artery bypass grafting has been performed. The superficial femoral artery proximally as well as the bypass graft are thrombosed. The native superficial femoral artery is thrombosed. The P1 and P2 segments of the popliteal artery are thrombosed. The profundus femoral artery is patent. There is reconstitution of the P3 segment of the popliteal artery via geniculate collaterals. Runoff: See above. Anterior tibial artery is occluded shortly beyond its origin. Peroneal and posterior tibial arteries continue to the ankle with patency of the plantar arch. Dorsalis pedis artery is diminutive but patent. Veins: Surgical changes of left saphenous vein harvesting are noted. The venous structures are otherwise unremarkable on this arterial phase examination. Review of the MIP images confirms the above findings. NON-VASCULAR Lower chest: Bibasilar pulmonary fibrotic change noted. No acute abnormality. Hepatobiliary: No focal liver abnormality is seen. No gallstones, gallbladder wall thickening, or biliary dilatation. Pancreas:  Unremarkable Spleen: Unremarkable Adrenals/Urinary Tract: The adrenal glands are unremarkable. The kidneys are normal in size and position. Simple cortical cyst noted within the lower pole the left kidney for which no follow-up imaging is recommended. The kidneys are otherwise unremarkable. Bladder is unremarkable. Stomach/Bowel: Stomach is within normal limits. Appendix appears normal. No evidence of bowel wall thickening, distention, or inflammatory changes. Lymphatic: No pathologic adenopathy within the abdomen and pelvis. Reproductive: Uterus and bilateral adnexa are unremarkable. Other: No abdominal wall hernia Musculoskeletal: No acute or significant osseous findings. IMPRESSION: 1. 3.2 x 3.7 cm fusiform infrarenal abdominal aortic aneurysm. Recommend follow-up ultrasound every 2 years. 2. 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 3. Short segment dissection of the right P1 popliteal artery. 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 4. Left lower extremity arterial outflow disease with surgical changes of left superficial femoral arterial-popliteal artery bypass grafting. The superficial femoral artery proximally as well as the bypass graft are thrombosed. The native superficial femoral artery is thrombosed. The P1 and P2 segments of the popliteal artery are thrombosed. There is reconstitution of the P3 segment of the popliteal artery via geniculate collaterals. 5. Two-vessel runoff bilaterally. Aortic Atherosclerosis (ICD10-I70.0). Electronically Signed   By: Helyn Numbers M.D.   On: 07/16/2023 19:54    Labs: BNP (last 3 results) Recent Labs    10/05/22 1436 02/13/23 1752  BNP 185.7* 212.3*   Basic Metabolic Panel: Recent Labs  Lab 07/18/23 0839 07/19/23 0325 07/20/23 0250 07/21/23 0252 07/22/23 0645  NA 133* 133* 131* 131* 134*  K 4.0 3.8 3.7 3.5 3.8  CL 101 101 99 98 101  CO2 25 25 26 29 26   GLUCOSE 101* 111* 99 101* 109*  BUN 13 12 13 13 15    CREATININE 0.90 0.92 1.09* 0.93 0.83  CALCIUM 8.8* 8.6* 8.5* 8.8* 8.9  MG 1.8 1.7 1.8  --   --   PHOS 3.6 3.5 2.9  --   --    Liver Function Tests: Recent Labs  Lab 07/19/23 1051  AST 15  ALT 12  ALKPHOS 58  BILITOT 0.6  PROT 5.9*  ALBUMIN 2.6*   No results for input(s): "LIPASE", "AMYLASE" in  the last 168 hours. No results for input(s): "AMMONIA" in the last 168 hours. CBC: Recent Labs  Lab 07/17/23 0537 07/17/23 1700 07/17/23 2250 07/18/23 0839 07/19/23 0325  WBC 7.3 7.8 9.6 6.2 7.2  HGB 11.4* 12.0 11.7* 11.2* 9.9*  HCT 36.9 37.8 37.1 35.6* 31.8*  MCV 87.4 86.3 87.3 88.8 89.6  PLT 298 323 228 258 217   Cardiac Enzymes: No results for input(s): "CKTOTAL", "CKMB", "CKMBINDEX", "TROPONINI" in the last 168 hours. BNP: Invalid input(s): "POCBNP" CBG: Recent Labs  Lab 07/16/23 2251 07/17/23 1637  GLUCAP 111* 81   D-Dimer No results for input(s): "DDIMER" in the last 72 hours. Hgb A1c No results for input(s): "HGBA1C" in the last 72 hours. Lipid Profile No results for input(s): "CHOL", "HDL", "LDLCALC", "TRIG", "CHOLHDL", "LDLDIRECT" in the last 72 hours. Thyroid function studies No results for input(s): "TSH", "T4TOTAL", "T3FREE", "THYROIDAB" in the last 72 hours.  Invalid input(s): "FREET3" Anemia work up No results for input(s): "VITAMINB12", "FOLATE", "FERRITIN", "TIBC", "IRON", "RETICCTPCT" in the last 72 hours. Urinalysis    Component Value Date/Time   COLORURINE STRAW (A) 03/03/2023 0543   APPEARANCEUR CLEAR 03/03/2023 0543   LABSPEC 1.013 03/03/2023 0543   PHURINE 7.0 03/03/2023 0543   GLUCOSEU NEGATIVE 03/03/2023 0543   HGBUR SMALL (A) 03/03/2023 0543   BILIRUBINUR NEGATIVE 03/03/2023 0543   BILIRUBINUR negative 11/10/2019 1432   BILIRUBINUR neg 07/31/2013 1752   KETONESUR NEGATIVE 03/03/2023 0543   PROTEINUR NEGATIVE 03/03/2023 0543   UROBILINOGEN 0.2 11/10/2019 1432   NITRITE NEGATIVE 03/03/2023 0543   LEUKOCYTESUR NEGATIVE 03/03/2023  0543   Sepsis Labs Recent Labs  Lab 07/17/23 1700 07/17/23 2250 07/18/23 0839 07/19/23 0325  WBC 7.8 9.6 6.2 7.2   Microbiology Recent Results (from the past 240 hour(s))  MRSA Next Gen by PCR, Nasal     Status: None   Collection Time: 07/17/23  5:06 PM   Specimen: Nasal Mucosa; Nasal Swab  Result Value Ref Range Status   MRSA by PCR Next Gen NOT DETECTED NOT DETECTED Final    Comment: (NOTE) The GeneXpert MRSA Assay (FDA approved for NASAL specimens only), is one component of a comprehensive MRSA colonization surveillance program. It is not intended to diagnose MRSA infection nor to guide or monitor treatment for MRSA infections. Test performance is not FDA approved in patients less than 33 years old. Performed at [facility], [address] [place], [place] [address]      Time coordinating discharge: 35 minutes  SIGNED: Lanae Boast, MD  [facility] 07/23/2023, 11:56 AM  If 7PM-7AM, please contact night-coverage [email or web address]

## 2023-07-23 NOTE — TOC Transition Note (Signed)
Transition of Care [facility]) - CM/SW Discharge Note   Patient Details  Name: [name] MRN: [id number] Date of Birth: [birth year]  Transition of Care [facility] CM/SW Contact:  Delilah Shan, LCSWA Phone Number: 07/23/2023, 12:26 PM   Clinical Narrative:     [name]   Anticipated DC date: 07/23/2023  Family notified: [name]  Transport by: Sharin Mons  ?  Per MD patient ready for DC to [facility] . RN, patient, patient's family, and facility notified of DC. Discharge Summary sent to facility. RN given number for report tele# [phone] RM# [phone]. DC packet on chart. Ambulance transport requested for patient.  CSW signing off.   Final next level of care: Skilled Nursing Facility Barriers to Discharge: No Barriers Identified   Patient Goals and CMS Choice CMS Medicare.gov Compare Post Acute Care list provided to:: Patient Choice offered to / list presented to : Patient, Adult Children (patient and son)  Discharge Placement                [facility] Patient to be transferred to facility by: [facility] Name of family member notified: [name] Patient and family notified of of transfer: 07/23/23  Discharge Plan and Services Additional resources added to the After Visit Summary for   In-house Referral: Clinical Social Work                                   Social Determinants of Health (SDOH) Interventions SDOH Screenings   Food Insecurity: No Food Insecurity (07/16/2023)  Housing: Low Risk  (07/16/2023)  Transportation Needs: No Transportation Needs (07/16/2023)  Utilities: Not At Risk (07/16/2023)  Alcohol Screen: Low Risk  (06/13/2023)  Depression (PHQ2-9): High Risk (06/13/2023)  Financial Resource Strain: Low Risk  (03/23/2021)  Physical Activity: Inactive (06/13/2023)  Social Connections: Socially Isolated (06/13/2023)  Stress: Stress Concern Present (06/13/2023)  Tobacco Use: High Risk (07/16/2023)  Health Literacy:  Adequate Health Literacy (06/13/2023)     Readmission Risk Interventions     No data to display

## 2023-07-24 DIAGNOSIS — R5381 Other malaise: Secondary | ICD-10-CM | POA: Diagnosis not present

## 2023-07-24 DIAGNOSIS — I10 Essential (primary) hypertension: Secondary | ICD-10-CM | POA: Diagnosis not present

## 2023-07-24 DIAGNOSIS — L409 Psoriasis, unspecified: Secondary | ICD-10-CM | POA: Diagnosis not present

## 2023-07-24 DIAGNOSIS — I739 Peripheral vascular disease, unspecified: Secondary | ICD-10-CM | POA: Diagnosis not present

## 2023-07-24 DIAGNOSIS — F419 Anxiety disorder, unspecified: Secondary | ICD-10-CM | POA: Diagnosis not present

## 2023-07-24 DIAGNOSIS — E785 Hyperlipidemia, unspecified: Secondary | ICD-10-CM | POA: Diagnosis not present

## 2023-07-24 DIAGNOSIS — I743 Embolism and thrombosis of arteries of the lower extremities: Secondary | ICD-10-CM | POA: Diagnosis not present

## 2023-07-24 DIAGNOSIS — I70222 Atherosclerosis of native arteries of extremities with rest pain, left leg: Secondary | ICD-10-CM | POA: Diagnosis not present

## 2023-07-24 DIAGNOSIS — G2581 Restless legs syndrome: Secondary | ICD-10-CM | POA: Diagnosis not present

## 2023-07-25 DIAGNOSIS — I714 Abdominal aortic aneurysm, without rupture, unspecified: Secondary | ICD-10-CM | POA: Diagnosis not present

## 2023-07-25 DIAGNOSIS — F32A Depression, unspecified: Secondary | ICD-10-CM | POA: Diagnosis not present

## 2023-07-25 DIAGNOSIS — I70222 Atherosclerosis of native arteries of extremities with rest pain, left leg: Secondary | ICD-10-CM | POA: Diagnosis not present

## 2023-07-25 DIAGNOSIS — G2581 Restless legs syndrome: Secondary | ICD-10-CM | POA: Diagnosis not present

## 2023-07-25 DIAGNOSIS — L89159 Pressure ulcer of sacral region, unspecified stage: Secondary | ICD-10-CM | POA: Diagnosis not present

## 2023-07-25 DIAGNOSIS — I10 Essential (primary) hypertension: Secondary | ICD-10-CM | POA: Diagnosis not present

## 2023-07-27 NOTE — Telephone Encounter (Signed)
This message was noted on day of call.  I did receive notification when she was admitted and notification of progress during her hospitalization, as well as discharge summary.  She was discharged to [facility].  I called to check status of patient today and spoke with both [name] and Okey Regal.  They are very happy with the care they received at [facility] and happy with progress since that treatment.  Also happy with facility currently.  Appreciated my call, no other specific concerns at this time.

## 2023-07-30 DIAGNOSIS — I743 Embolism and thrombosis of arteries of the lower extremities: Secondary | ICD-10-CM | POA: Diagnosis not present

## 2023-07-30 DIAGNOSIS — J439 Emphysema, unspecified: Secondary | ICD-10-CM | POA: Diagnosis not present

## 2023-07-30 DIAGNOSIS — I70222 Atherosclerosis of native arteries of extremities with rest pain, left leg: Secondary | ICD-10-CM | POA: Diagnosis not present

## 2023-07-30 DIAGNOSIS — I714 Abdominal aortic aneurysm, without rupture, unspecified: Secondary | ICD-10-CM | POA: Diagnosis not present

## 2023-07-30 DIAGNOSIS — K5900 Constipation, unspecified: Secondary | ICD-10-CM | POA: Diagnosis not present

## 2023-07-30 DIAGNOSIS — I739 Peripheral vascular disease, unspecified: Secondary | ICD-10-CM | POA: Diagnosis not present

## 2023-07-30 DIAGNOSIS — G2581 Restless legs syndrome: Secondary | ICD-10-CM | POA: Diagnosis not present

## 2023-07-30 DIAGNOSIS — R5381 Other malaise: Secondary | ICD-10-CM | POA: Diagnosis not present

## 2023-07-30 DIAGNOSIS — E785 Hyperlipidemia, unspecified: Secondary | ICD-10-CM | POA: Diagnosis not present

## 2023-07-30 DIAGNOSIS — I10 Essential (primary) hypertension: Secondary | ICD-10-CM | POA: Diagnosis not present

## 2023-07-30 DIAGNOSIS — J8410 Pulmonary fibrosis, unspecified: Secondary | ICD-10-CM | POA: Diagnosis not present

## 2023-08-01 DIAGNOSIS — Z79899 Other long term (current) drug therapy: Secondary | ICD-10-CM | POA: Diagnosis not present

## 2023-08-01 DIAGNOSIS — K5900 Constipation, unspecified: Secondary | ICD-10-CM | POA: Diagnosis not present

## 2023-08-01 DIAGNOSIS — F32A Depression, unspecified: Secondary | ICD-10-CM | POA: Diagnosis not present

## 2023-08-01 DIAGNOSIS — F419 Anxiety disorder, unspecified: Secondary | ICD-10-CM | POA: Diagnosis not present

## 2023-08-06 DIAGNOSIS — G2581 Restless legs syndrome: Secondary | ICD-10-CM | POA: Diagnosis not present

## 2023-08-06 DIAGNOSIS — F419 Anxiety disorder, unspecified: Secondary | ICD-10-CM | POA: Diagnosis not present

## 2023-08-06 DIAGNOSIS — I739 Peripheral vascular disease, unspecified: Secondary | ICD-10-CM | POA: Diagnosis not present

## 2023-08-06 DIAGNOSIS — J439 Emphysema, unspecified: Secondary | ICD-10-CM | POA: Diagnosis not present

## 2023-08-06 DIAGNOSIS — L409 Psoriasis, unspecified: Secondary | ICD-10-CM | POA: Diagnosis not present

## 2023-08-06 DIAGNOSIS — R5381 Other malaise: Secondary | ICD-10-CM | POA: Diagnosis not present

## 2023-08-06 DIAGNOSIS — I714 Abdominal aortic aneurysm, without rupture, unspecified: Secondary | ICD-10-CM | POA: Diagnosis not present

## 2023-08-06 DIAGNOSIS — J8410 Pulmonary fibrosis, unspecified: Secondary | ICD-10-CM | POA: Diagnosis not present

## 2023-08-06 DIAGNOSIS — I10 Essential (primary) hypertension: Secondary | ICD-10-CM | POA: Diagnosis not present

## 2023-08-06 DIAGNOSIS — E785 Hyperlipidemia, unspecified: Secondary | ICD-10-CM | POA: Diagnosis not present

## 2023-08-06 DIAGNOSIS — F32A Depression, unspecified: Secondary | ICD-10-CM | POA: Diagnosis not present

## 2023-08-06 DIAGNOSIS — I70222 Atherosclerosis of native arteries of extremities with rest pain, left leg: Secondary | ICD-10-CM | POA: Diagnosis not present

## 2023-08-07 DIAGNOSIS — R0602 Shortness of breath: Secondary | ICD-10-CM | POA: Diagnosis not present

## 2023-08-07 DIAGNOSIS — I7121 Aneurysm of the ascending aorta, without rupture: Secondary | ICD-10-CM | POA: Diagnosis not present

## 2023-08-07 DIAGNOSIS — G319 Degenerative disease of nervous system, unspecified: Secondary | ICD-10-CM | POA: Diagnosis not present

## 2023-08-07 DIAGNOSIS — R9089 Other abnormal findings on diagnostic imaging of central nervous system: Secondary | ICD-10-CM | POA: Diagnosis not present

## 2023-08-07 DIAGNOSIS — R531 Weakness: Secondary | ICD-10-CM | POA: Diagnosis not present

## 2023-08-07 DIAGNOSIS — I739 Peripheral vascular disease, unspecified: Secondary | ICD-10-CM | POA: Diagnosis not present

## 2023-08-07 DIAGNOSIS — I70722 Atherosclerosis of other type of bypass graft(s) of the extremities with rest pain, left leg: Secondary | ICD-10-CM | POA: Diagnosis not present

## 2023-08-07 DIAGNOSIS — N1832 Chronic kidney disease, stage 3b: Secondary | ICD-10-CM | POA: Diagnosis present

## 2023-08-07 DIAGNOSIS — Z66 Do not resuscitate: Secondary | ICD-10-CM | POA: Diagnosis present

## 2023-08-07 DIAGNOSIS — G2581 Restless legs syndrome: Secondary | ICD-10-CM | POA: Diagnosis not present

## 2023-08-07 DIAGNOSIS — F329 Major depressive disorder, single episode, unspecified: Secondary | ICD-10-CM | POA: Diagnosis present

## 2023-08-07 DIAGNOSIS — R1314 Dysphagia, pharyngoesophageal phase: Secondary | ICD-10-CM | POA: Diagnosis present

## 2023-08-07 DIAGNOSIS — Z7902 Long term (current) use of antithrombotics/antiplatelets: Secondary | ICD-10-CM | POA: Diagnosis not present

## 2023-08-07 DIAGNOSIS — Z9981 Dependence on supplemental oxygen: Secondary | ICD-10-CM | POA: Diagnosis not present

## 2023-08-07 DIAGNOSIS — I700 Atherosclerosis of aorta: Secondary | ICD-10-CM | POA: Diagnosis not present

## 2023-08-07 DIAGNOSIS — Z7982 Long term (current) use of aspirin: Secondary | ICD-10-CM | POA: Diagnosis not present

## 2023-08-07 DIAGNOSIS — R2990 Unspecified symptoms and signs involving the nervous system: Secondary | ICD-10-CM | POA: Diagnosis not present

## 2023-08-07 DIAGNOSIS — Z86718 Personal history of other venous thrombosis and embolism: Secondary | ICD-10-CM | POA: Diagnosis not present

## 2023-08-07 DIAGNOSIS — I69322 Dysarthria following cerebral infarction: Secondary | ICD-10-CM | POA: Diagnosis not present

## 2023-08-07 DIAGNOSIS — E039 Hypothyroidism, unspecified: Secondary | ICD-10-CM | POA: Diagnosis not present

## 2023-08-07 DIAGNOSIS — I69351 Hemiplegia and hemiparesis following cerebral infarction affecting right dominant side: Secondary | ICD-10-CM | POA: Diagnosis not present

## 2023-08-07 DIAGNOSIS — G479 Sleep disorder, unspecified: Secondary | ICD-10-CM | POA: Diagnosis not present

## 2023-08-07 DIAGNOSIS — I129 Hypertensive chronic kidney disease with stage 1 through stage 4 chronic kidney disease, or unspecified chronic kidney disease: Secondary | ICD-10-CM | POA: Diagnosis present

## 2023-08-07 DIAGNOSIS — E785 Hyperlipidemia, unspecified: Secondary | ICD-10-CM | POA: Diagnosis present

## 2023-08-07 DIAGNOSIS — J8410 Pulmonary fibrosis, unspecified: Secondary | ICD-10-CM | POA: Diagnosis not present

## 2023-08-07 DIAGNOSIS — I69398 Other sequelae of cerebral infarction: Secondary | ICD-10-CM | POA: Diagnosis not present

## 2023-08-07 DIAGNOSIS — N3000 Acute cystitis without hematuria: Secondary | ICD-10-CM | POA: Diagnosis present

## 2023-08-07 DIAGNOSIS — I639 Cerebral infarction, unspecified: Secondary | ICD-10-CM | POA: Diagnosis not present

## 2023-08-07 DIAGNOSIS — E860 Dehydration: Secondary | ICD-10-CM | POA: Diagnosis present

## 2023-08-07 DIAGNOSIS — E038 Other specified hypothyroidism: Secondary | ICD-10-CM | POA: Diagnosis present

## 2023-08-07 DIAGNOSIS — I82412 Acute embolism and thrombosis of left femoral vein: Secondary | ICD-10-CM | POA: Diagnosis not present

## 2023-08-07 DIAGNOSIS — Z86011 Personal history of benign neoplasm of the brain: Secondary | ICD-10-CM | POA: Diagnosis not present

## 2023-08-07 DIAGNOSIS — N183 Chronic kidney disease, stage 3 unspecified: Secondary | ICD-10-CM | POA: Diagnosis not present

## 2023-08-07 DIAGNOSIS — R638 Other symptoms and signs concerning food and fluid intake: Secondary | ICD-10-CM | POA: Diagnosis not present

## 2023-08-07 DIAGNOSIS — D320 Benign neoplasm of cerebral meninges: Secondary | ICD-10-CM | POA: Diagnosis not present

## 2023-08-07 DIAGNOSIS — R471 Dysarthria and anarthria: Secondary | ICD-10-CM | POA: Diagnosis not present

## 2023-08-07 DIAGNOSIS — F411 Generalized anxiety disorder: Secondary | ICD-10-CM | POA: Diagnosis not present

## 2023-08-07 DIAGNOSIS — Z743 Need for continuous supervision: Secondary | ICD-10-CM | POA: Diagnosis not present

## 2023-08-07 DIAGNOSIS — K219 Gastro-esophageal reflux disease without esophagitis: Secondary | ICD-10-CM | POA: Diagnosis present

## 2023-08-07 DIAGNOSIS — R29810 Facial weakness: Secondary | ICD-10-CM | POA: Diagnosis present

## 2023-08-07 DIAGNOSIS — I517 Cardiomegaly: Secondary | ICD-10-CM | POA: Diagnosis not present

## 2023-08-07 DIAGNOSIS — N3090 Cystitis, unspecified without hematuria: Secondary | ICD-10-CM | POA: Diagnosis not present

## 2023-08-07 DIAGNOSIS — J432 Centrilobular emphysema: Secondary | ICD-10-CM | POA: Diagnosis not present

## 2023-08-07 DIAGNOSIS — Z79899 Other long term (current) drug therapy: Secondary | ICD-10-CM | POA: Diagnosis not present

## 2023-08-07 DIAGNOSIS — K5909 Other constipation: Secondary | ICD-10-CM | POA: Diagnosis not present

## 2023-08-07 DIAGNOSIS — L409 Psoriasis, unspecified: Secondary | ICD-10-CM | POA: Diagnosis not present

## 2023-08-07 DIAGNOSIS — I10 Essential (primary) hypertension: Secondary | ICD-10-CM | POA: Diagnosis not present

## 2023-08-07 DIAGNOSIS — L89151 Pressure ulcer of sacral region, stage 1: Secondary | ICD-10-CM | POA: Diagnosis not present

## 2023-08-07 DIAGNOSIS — I70228 Atherosclerosis of native arteries of extremities with rest pain, other extremity: Secondary | ICD-10-CM | POA: Diagnosis not present

## 2023-08-07 DIAGNOSIS — Z7901 Long term (current) use of anticoagulants: Secondary | ICD-10-CM | POA: Diagnosis not present

## 2023-08-07 DIAGNOSIS — R1310 Dysphagia, unspecified: Secondary | ICD-10-CM | POA: Diagnosis not present

## 2023-08-08 DIAGNOSIS — I70228 Atherosclerosis of native arteries of extremities with rest pain, other extremity: Secondary | ICD-10-CM | POA: Diagnosis not present

## 2023-08-08 DIAGNOSIS — Z79899 Other long term (current) drug therapy: Secondary | ICD-10-CM | POA: Diagnosis not present

## 2023-08-08 DIAGNOSIS — N1832 Chronic kidney disease, stage 3b: Secondary | ICD-10-CM | POA: Diagnosis present

## 2023-08-08 DIAGNOSIS — N183 Chronic kidney disease, stage 3 unspecified: Secondary | ICD-10-CM | POA: Diagnosis not present

## 2023-08-08 DIAGNOSIS — Z7982 Long term (current) use of aspirin: Secondary | ICD-10-CM | POA: Diagnosis not present

## 2023-08-08 DIAGNOSIS — J432 Centrilobular emphysema: Secondary | ICD-10-CM | POA: Diagnosis not present

## 2023-08-08 DIAGNOSIS — R0602 Shortness of breath: Secondary | ICD-10-CM | POA: Diagnosis not present

## 2023-08-08 DIAGNOSIS — G479 Sleep disorder, unspecified: Secondary | ICD-10-CM | POA: Diagnosis not present

## 2023-08-08 DIAGNOSIS — I82412 Acute embolism and thrombosis of left femoral vein: Secondary | ICD-10-CM | POA: Diagnosis not present

## 2023-08-08 DIAGNOSIS — I739 Peripheral vascular disease, unspecified: Secondary | ICD-10-CM | POA: Diagnosis not present

## 2023-08-08 DIAGNOSIS — I7121 Aneurysm of the ascending aorta, without rupture: Secondary | ICD-10-CM | POA: Diagnosis not present

## 2023-08-08 DIAGNOSIS — N3090 Cystitis, unspecified without hematuria: Secondary | ICD-10-CM | POA: Diagnosis not present

## 2023-08-08 DIAGNOSIS — Z86718 Personal history of other venous thrombosis and embolism: Secondary | ICD-10-CM | POA: Diagnosis not present

## 2023-08-08 DIAGNOSIS — L409 Psoriasis, unspecified: Secondary | ICD-10-CM | POA: Diagnosis not present

## 2023-08-08 DIAGNOSIS — L89151 Pressure ulcer of sacral region, stage 1: Secondary | ICD-10-CM | POA: Diagnosis not present

## 2023-08-08 DIAGNOSIS — I10 Essential (primary) hypertension: Secondary | ICD-10-CM | POA: Diagnosis not present

## 2023-08-08 DIAGNOSIS — I69398 Other sequelae of cerebral infarction: Secondary | ICD-10-CM | POA: Diagnosis not present

## 2023-08-08 DIAGNOSIS — Z7902 Long term (current) use of antithrombotics/antiplatelets: Secondary | ICD-10-CM | POA: Diagnosis not present

## 2023-08-08 DIAGNOSIS — E039 Hypothyroidism, unspecified: Secondary | ICD-10-CM | POA: Diagnosis not present

## 2023-08-08 DIAGNOSIS — I639 Cerebral infarction, unspecified: Secondary | ICD-10-CM | POA: Diagnosis not present

## 2023-08-08 DIAGNOSIS — G2581 Restless legs syndrome: Secondary | ICD-10-CM | POA: Diagnosis present

## 2023-08-08 DIAGNOSIS — E038 Other specified hypothyroidism: Secondary | ICD-10-CM | POA: Diagnosis present

## 2023-08-08 DIAGNOSIS — F329 Major depressive disorder, single episode, unspecified: Secondary | ICD-10-CM | POA: Diagnosis present

## 2023-08-08 DIAGNOSIS — I517 Cardiomegaly: Secondary | ICD-10-CM | POA: Diagnosis not present

## 2023-08-08 DIAGNOSIS — D320 Benign neoplasm of cerebral meninges: Secondary | ICD-10-CM | POA: Diagnosis not present

## 2023-08-08 DIAGNOSIS — Z9981 Dependence on supplemental oxygen: Secondary | ICD-10-CM | POA: Diagnosis not present

## 2023-08-08 DIAGNOSIS — Z66 Do not resuscitate: Secondary | ICD-10-CM | POA: Diagnosis present

## 2023-08-08 DIAGNOSIS — F411 Generalized anxiety disorder: Secondary | ICD-10-CM | POA: Diagnosis not present

## 2023-08-08 DIAGNOSIS — I69351 Hemiplegia and hemiparesis following cerebral infarction affecting right dominant side: Secondary | ICD-10-CM | POA: Diagnosis not present

## 2023-08-08 DIAGNOSIS — R638 Other symptoms and signs concerning food and fluid intake: Secondary | ICD-10-CM | POA: Diagnosis not present

## 2023-08-08 DIAGNOSIS — I70722 Atherosclerosis of other type of bypass graft(s) of the extremities with rest pain, left leg: Secondary | ICD-10-CM | POA: Diagnosis not present

## 2023-08-08 DIAGNOSIS — K219 Gastro-esophageal reflux disease without esophagitis: Secondary | ICD-10-CM | POA: Diagnosis present

## 2023-08-08 DIAGNOSIS — J8410 Pulmonary fibrosis, unspecified: Secondary | ICD-10-CM | POA: Diagnosis not present

## 2023-08-08 DIAGNOSIS — Z86011 Personal history of benign neoplasm of the brain: Secondary | ICD-10-CM | POA: Diagnosis not present

## 2023-08-08 DIAGNOSIS — N3000 Acute cystitis without hematuria: Secondary | ICD-10-CM | POA: Diagnosis present

## 2023-08-08 DIAGNOSIS — R531 Weakness: Secondary | ICD-10-CM | POA: Diagnosis not present

## 2023-08-08 DIAGNOSIS — E860 Dehydration: Secondary | ICD-10-CM | POA: Diagnosis present

## 2023-08-08 DIAGNOSIS — Z743 Need for continuous supervision: Secondary | ICD-10-CM | POA: Diagnosis not present

## 2023-08-08 DIAGNOSIS — Z7901 Long term (current) use of anticoagulants: Secondary | ICD-10-CM | POA: Diagnosis not present

## 2023-08-08 DIAGNOSIS — R1310 Dysphagia, unspecified: Secondary | ICD-10-CM | POA: Diagnosis not present

## 2023-08-08 DIAGNOSIS — R1314 Dysphagia, pharyngoesophageal phase: Secondary | ICD-10-CM | POA: Diagnosis present

## 2023-08-08 DIAGNOSIS — I129 Hypertensive chronic kidney disease with stage 1 through stage 4 chronic kidney disease, or unspecified chronic kidney disease: Secondary | ICD-10-CM | POA: Diagnosis present

## 2023-08-08 DIAGNOSIS — I69322 Dysarthria following cerebral infarction: Secondary | ICD-10-CM | POA: Diagnosis not present

## 2023-08-08 DIAGNOSIS — E785 Hyperlipidemia, unspecified: Secondary | ICD-10-CM | POA: Diagnosis present

## 2023-08-08 DIAGNOSIS — R29810 Facial weakness: Secondary | ICD-10-CM | POA: Diagnosis present

## 2023-08-08 DIAGNOSIS — R471 Dysarthria and anarthria: Secondary | ICD-10-CM | POA: Diagnosis not present

## 2023-08-08 DIAGNOSIS — K5909 Other constipation: Secondary | ICD-10-CM | POA: Diagnosis not present

## 2023-08-10 DIAGNOSIS — I82412 Acute embolism and thrombosis of left femoral vein: Secondary | ICD-10-CM | POA: Diagnosis not present

## 2023-08-10 DIAGNOSIS — F32A Depression, unspecified: Secondary | ICD-10-CM | POA: Diagnosis not present

## 2023-08-10 DIAGNOSIS — I70222 Atherosclerosis of native arteries of extremities with rest pain, left leg: Secondary | ICD-10-CM | POA: Diagnosis not present

## 2023-08-10 DIAGNOSIS — I70722 Atherosclerosis of other type of bypass graft(s) of the extremities with rest pain, left leg: Secondary | ICD-10-CM | POA: Diagnosis not present

## 2023-08-10 DIAGNOSIS — N390 Urinary tract infection, site not specified: Secondary | ICD-10-CM | POA: Diagnosis not present

## 2023-08-10 DIAGNOSIS — Z743 Need for continuous supervision: Secondary | ICD-10-CM | POA: Diagnosis not present

## 2023-08-10 DIAGNOSIS — F411 Generalized anxiety disorder: Secondary | ICD-10-CM | POA: Diagnosis not present

## 2023-08-10 DIAGNOSIS — I743 Embolism and thrombosis of arteries of the lower extremities: Secondary | ICD-10-CM | POA: Diagnosis not present

## 2023-08-10 DIAGNOSIS — L89151 Pressure ulcer of sacral region, stage 1: Secondary | ICD-10-CM | POA: Diagnosis not present

## 2023-08-10 DIAGNOSIS — R1310 Dysphagia, unspecified: Secondary | ICD-10-CM | POA: Diagnosis not present

## 2023-08-10 DIAGNOSIS — I739 Peripheral vascular disease, unspecified: Secondary | ICD-10-CM | POA: Diagnosis not present

## 2023-08-10 DIAGNOSIS — J432 Centrilobular emphysema: Secondary | ICD-10-CM | POA: Diagnosis not present

## 2023-08-10 DIAGNOSIS — Z7901 Long term (current) use of anticoagulants: Secondary | ICD-10-CM | POA: Diagnosis not present

## 2023-08-10 DIAGNOSIS — R0602 Shortness of breath: Secondary | ICD-10-CM | POA: Diagnosis not present

## 2023-08-10 DIAGNOSIS — I10 Essential (primary) hypertension: Secondary | ICD-10-CM | POA: Diagnosis not present

## 2023-08-10 DIAGNOSIS — F329 Major depressive disorder, single episode, unspecified: Secondary | ICD-10-CM | POA: Diagnosis not present

## 2023-08-10 DIAGNOSIS — I129 Hypertensive chronic kidney disease with stage 1 through stage 4 chronic kidney disease, or unspecified chronic kidney disease: Secondary | ICD-10-CM | POA: Diagnosis not present

## 2023-08-10 DIAGNOSIS — Z9981 Dependence on supplemental oxygen: Secondary | ICD-10-CM | POA: Diagnosis not present

## 2023-08-10 DIAGNOSIS — R5381 Other malaise: Secondary | ICD-10-CM | POA: Diagnosis not present

## 2023-08-10 DIAGNOSIS — I70228 Atherosclerosis of native arteries of extremities with rest pain, other extremity: Secondary | ICD-10-CM | POA: Diagnosis not present

## 2023-08-10 DIAGNOSIS — L89159 Pressure ulcer of sacral region, unspecified stage: Secondary | ICD-10-CM | POA: Diagnosis not present

## 2023-08-10 DIAGNOSIS — I69398 Other sequelae of cerebral infarction: Secondary | ICD-10-CM | POA: Diagnosis not present

## 2023-08-10 DIAGNOSIS — F319 Bipolar disorder, unspecified: Secondary | ICD-10-CM | POA: Diagnosis not present

## 2023-08-10 DIAGNOSIS — J439 Emphysema, unspecified: Secondary | ICD-10-CM | POA: Diagnosis not present

## 2023-08-10 DIAGNOSIS — I714 Abdominal aortic aneurysm, without rupture, unspecified: Secondary | ICD-10-CM | POA: Diagnosis not present

## 2023-08-10 DIAGNOSIS — E785 Hyperlipidemia, unspecified: Secondary | ICD-10-CM | POA: Diagnosis not present

## 2023-08-10 DIAGNOSIS — G479 Sleep disorder, unspecified: Secondary | ICD-10-CM | POA: Diagnosis not present

## 2023-08-10 DIAGNOSIS — I7121 Aneurysm of the ascending aorta, without rupture: Secondary | ICD-10-CM | POA: Diagnosis not present

## 2023-08-10 DIAGNOSIS — R471 Dysarthria and anarthria: Secondary | ICD-10-CM | POA: Diagnosis not present

## 2023-08-10 DIAGNOSIS — N3000 Acute cystitis without hematuria: Secondary | ICD-10-CM | POA: Diagnosis not present

## 2023-08-10 DIAGNOSIS — L409 Psoriasis, unspecified: Secondary | ICD-10-CM | POA: Diagnosis not present

## 2023-08-10 DIAGNOSIS — J8410 Pulmonary fibrosis, unspecified: Secondary | ICD-10-CM | POA: Diagnosis not present

## 2023-08-10 DIAGNOSIS — G2581 Restless legs syndrome: Secondary | ICD-10-CM | POA: Diagnosis not present

## 2023-08-10 DIAGNOSIS — N183 Chronic kidney disease, stage 3 unspecified: Secondary | ICD-10-CM | POA: Diagnosis not present

## 2023-08-10 DIAGNOSIS — K5909 Other constipation: Secondary | ICD-10-CM | POA: Diagnosis not present

## 2023-08-10 DIAGNOSIS — K219 Gastro-esophageal reflux disease without esophagitis: Secondary | ICD-10-CM | POA: Diagnosis not present

## 2023-08-10 DIAGNOSIS — R2990 Unspecified symptoms and signs involving the nervous system: Secondary | ICD-10-CM | POA: Diagnosis not present

## 2023-08-10 DIAGNOSIS — R29810 Facial weakness: Secondary | ICD-10-CM | POA: Diagnosis not present

## 2023-08-10 DIAGNOSIS — F419 Anxiety disorder, unspecified: Secondary | ICD-10-CM | POA: Diagnosis not present

## 2023-08-10 DIAGNOSIS — E039 Hypothyroidism, unspecified: Secondary | ICD-10-CM | POA: Diagnosis not present

## 2023-08-10 DIAGNOSIS — I639 Cerebral infarction, unspecified: Secondary | ICD-10-CM | POA: Diagnosis not present

## 2023-08-13 ENCOUNTER — Other Ambulatory Visit: Payer: Self-pay | Admitting: *Deleted

## 2023-08-13 DIAGNOSIS — E785 Hyperlipidemia, unspecified: Secondary | ICD-10-CM | POA: Diagnosis not present

## 2023-08-13 DIAGNOSIS — K5909 Other constipation: Secondary | ICD-10-CM | POA: Diagnosis not present

## 2023-08-13 DIAGNOSIS — N390 Urinary tract infection, site not specified: Secondary | ICD-10-CM | POA: Diagnosis not present

## 2023-08-13 DIAGNOSIS — I10 Essential (primary) hypertension: Secondary | ICD-10-CM | POA: Diagnosis not present

## 2023-08-13 DIAGNOSIS — L89159 Pressure ulcer of sacral region, unspecified stage: Secondary | ICD-10-CM | POA: Diagnosis not present

## 2023-08-13 DIAGNOSIS — I743 Embolism and thrombosis of arteries of the lower extremities: Secondary | ICD-10-CM | POA: Diagnosis not present

## 2023-08-13 DIAGNOSIS — I739 Peripheral vascular disease, unspecified: Secondary | ICD-10-CM

## 2023-08-13 DIAGNOSIS — I70229 Atherosclerosis of native arteries of extremities with rest pain, unspecified extremity: Secondary | ICD-10-CM

## 2023-08-13 DIAGNOSIS — R5381 Other malaise: Secondary | ICD-10-CM | POA: Diagnosis not present

## 2023-08-13 DIAGNOSIS — I714 Abdominal aortic aneurysm, without rupture, unspecified: Secondary | ICD-10-CM | POA: Diagnosis not present

## 2023-08-13 DIAGNOSIS — F319 Bipolar disorder, unspecified: Secondary | ICD-10-CM | POA: Diagnosis not present

## 2023-08-15 DIAGNOSIS — I714 Abdominal aortic aneurysm, without rupture, unspecified: Secondary | ICD-10-CM | POA: Diagnosis not present

## 2023-08-15 DIAGNOSIS — R2990 Unspecified symptoms and signs involving the nervous system: Secondary | ICD-10-CM | POA: Diagnosis not present

## 2023-08-15 DIAGNOSIS — J8410 Pulmonary fibrosis, unspecified: Secondary | ICD-10-CM | POA: Diagnosis not present

## 2023-08-15 DIAGNOSIS — N390 Urinary tract infection, site not specified: Secondary | ICD-10-CM | POA: Diagnosis not present

## 2023-08-15 DIAGNOSIS — F419 Anxiety disorder, unspecified: Secondary | ICD-10-CM | POA: Diagnosis not present

## 2023-08-15 DIAGNOSIS — F32A Depression, unspecified: Secondary | ICD-10-CM | POA: Diagnosis not present

## 2023-08-21 DIAGNOSIS — J8410 Pulmonary fibrosis, unspecified: Secondary | ICD-10-CM | POA: Diagnosis not present

## 2023-08-21 DIAGNOSIS — G2581 Restless legs syndrome: Secondary | ICD-10-CM | POA: Diagnosis not present

## 2023-08-21 DIAGNOSIS — L89159 Pressure ulcer of sacral region, unspecified stage: Secondary | ICD-10-CM | POA: Diagnosis not present

## 2023-08-21 DIAGNOSIS — I739 Peripheral vascular disease, unspecified: Secondary | ICD-10-CM | POA: Diagnosis not present

## 2023-08-21 DIAGNOSIS — N390 Urinary tract infection, site not specified: Secondary | ICD-10-CM | POA: Diagnosis not present

## 2023-08-21 DIAGNOSIS — L409 Psoriasis, unspecified: Secondary | ICD-10-CM | POA: Diagnosis not present

## 2023-08-21 DIAGNOSIS — F32A Depression, unspecified: Secondary | ICD-10-CM | POA: Diagnosis not present

## 2023-08-21 DIAGNOSIS — J439 Emphysema, unspecified: Secondary | ICD-10-CM | POA: Diagnosis not present

## 2023-08-21 DIAGNOSIS — E785 Hyperlipidemia, unspecified: Secondary | ICD-10-CM | POA: Diagnosis not present

## 2023-08-21 DIAGNOSIS — F319 Bipolar disorder, unspecified: Secondary | ICD-10-CM | POA: Diagnosis not present

## 2023-08-21 DIAGNOSIS — I70222 Atherosclerosis of native arteries of extremities with rest pain, left leg: Secondary | ICD-10-CM | POA: Diagnosis not present

## 2023-08-21 DIAGNOSIS — R5381 Other malaise: Secondary | ICD-10-CM | POA: Diagnosis not present

## 2023-08-23 DIAGNOSIS — I69391 Dysphagia following cerebral infarction: Secondary | ICD-10-CM | POA: Diagnosis not present

## 2023-08-23 DIAGNOSIS — M6250 Muscle wasting and atrophy, not elsewhere classified, unspecified site: Secondary | ICD-10-CM | POA: Diagnosis not present

## 2023-08-23 DIAGNOSIS — R1312 Dysphagia, oropharyngeal phase: Secondary | ICD-10-CM | POA: Diagnosis not present

## 2023-08-23 DIAGNOSIS — I69322 Dysarthria following cerebral infarction: Secondary | ICD-10-CM | POA: Diagnosis not present

## 2023-08-23 DIAGNOSIS — I69922 Dysarthria following unspecified cerebrovascular disease: Secondary | ICD-10-CM | POA: Diagnosis not present

## 2023-08-23 DIAGNOSIS — M6281 Muscle weakness (generalized): Secondary | ICD-10-CM | POA: Diagnosis not present

## 2023-08-23 DIAGNOSIS — R2689 Other abnormalities of gait and mobility: Secondary | ICD-10-CM | POA: Diagnosis not present

## 2023-08-24 ENCOUNTER — Ambulatory Visit (HOSPITAL_COMMUNITY): Payer: Medicare Other

## 2023-08-24 ENCOUNTER — Ambulatory Visit (INDEPENDENT_AMBULATORY_CARE_PROVIDER_SITE_OTHER)
Admission: RE | Admit: 2023-08-24 | Discharge: 2023-08-24 | Disposition: A | Discharge status: Home / Self Care | Payer: Medicare Other | Source: Ambulatory Visit | Attending: Vascular Surgery | Admitting: Vascular Surgery

## 2023-08-24 ENCOUNTER — Other Ambulatory Visit (HOSPITAL_COMMUNITY): Payer: Medicare Other

## 2023-08-24 ENCOUNTER — Encounter: Payer: Medicare Other | Admitting: Vascular Surgery

## 2023-08-24 ENCOUNTER — Ambulatory Visit (HOSPITAL_COMMUNITY)
Admission: RE | Admit: 2023-08-24 | Discharge: 2023-08-24 | Disposition: A | Discharge status: Home / Self Care | Payer: Medicare Other | Source: Ambulatory Visit | Attending: Vascular Surgery

## 2023-08-24 DIAGNOSIS — I69391 Dysphagia following cerebral infarction: Secondary | ICD-10-CM | POA: Diagnosis not present

## 2023-08-24 DIAGNOSIS — I739 Peripheral vascular disease, unspecified: Secondary | ICD-10-CM | POA: Insufficient documentation

## 2023-08-24 DIAGNOSIS — R2689 Other abnormalities of gait and mobility: Secondary | ICD-10-CM | POA: Diagnosis not present

## 2023-08-24 DIAGNOSIS — I70229 Atherosclerosis of native arteries of extremities with rest pain, unspecified extremity: Secondary | ICD-10-CM | POA: Insufficient documentation

## 2023-08-24 DIAGNOSIS — R1312 Dysphagia, oropharyngeal phase: Secondary | ICD-10-CM | POA: Diagnosis not present

## 2023-08-24 DIAGNOSIS — M6250 Muscle wasting and atrophy, not elsewhere classified, unspecified site: Secondary | ICD-10-CM | POA: Diagnosis not present

## 2023-08-24 DIAGNOSIS — M6281 Muscle weakness (generalized): Secondary | ICD-10-CM | POA: Diagnosis not present

## 2023-08-24 DIAGNOSIS — I69922 Dysarthria following unspecified cerebrovascular disease: Secondary | ICD-10-CM | POA: Diagnosis not present

## 2023-08-24 DIAGNOSIS — I70222 Atherosclerosis of native arteries of extremities with rest pain, left leg: Secondary | ICD-10-CM

## 2023-08-24 LAB — VAS US ABI WITH/WO TBI
Left ABI: 0.97
Right ABI: 0.95

## 2023-08-25 DIAGNOSIS — R1312 Dysphagia, oropharyngeal phase: Secondary | ICD-10-CM | POA: Diagnosis not present

## 2023-08-25 DIAGNOSIS — R2689 Other abnormalities of gait and mobility: Secondary | ICD-10-CM | POA: Diagnosis not present

## 2023-08-25 DIAGNOSIS — M6281 Muscle weakness (generalized): Secondary | ICD-10-CM | POA: Diagnosis not present

## 2023-08-25 DIAGNOSIS — I69391 Dysphagia following cerebral infarction: Secondary | ICD-10-CM | POA: Diagnosis not present

## 2023-08-25 DIAGNOSIS — M6250 Muscle wasting and atrophy, not elsewhere classified, unspecified site: Secondary | ICD-10-CM | POA: Diagnosis not present

## 2023-08-25 DIAGNOSIS — I69922 Dysarthria following unspecified cerebrovascular disease: Secondary | ICD-10-CM | POA: Diagnosis not present

## 2023-08-27 DIAGNOSIS — R2689 Other abnormalities of gait and mobility: Secondary | ICD-10-CM | POA: Diagnosis not present

## 2023-08-27 DIAGNOSIS — I69391 Dysphagia following cerebral infarction: Secondary | ICD-10-CM | POA: Diagnosis not present

## 2023-08-27 DIAGNOSIS — I69922 Dysarthria following unspecified cerebrovascular disease: Secondary | ICD-10-CM | POA: Diagnosis not present

## 2023-08-27 DIAGNOSIS — M6250 Muscle wasting and atrophy, not elsewhere classified, unspecified site: Secondary | ICD-10-CM | POA: Diagnosis not present

## 2023-08-27 DIAGNOSIS — M6281 Muscle weakness (generalized): Secondary | ICD-10-CM | POA: Diagnosis not present

## 2023-08-27 DIAGNOSIS — R1312 Dysphagia, oropharyngeal phase: Secondary | ICD-10-CM | POA: Diagnosis not present

## 2023-08-27 NOTE — Progress Notes (Signed)
Patient name: [name] MRN: [id number] DOB: 09/01/1946 Sex: female  REASON FOR VISIT: Hospital follow-up, thrombosis of left femoropopliteal bypass  HPI: [name] is a 77 y.o. female with multiple medical problems including hypertension, PAD that presents for hospital follow-up.  Recently seen with thrombosed left lower extremity bypass.  She has a history of left SFA to BK pop bypass done with vein in 2020 by Dr. Darrick Penna.  On 07/17/2023 she underwent left lower extremity thrombectomy with initiation of thrombolysis and was taken back on 07/18/2023 for left lower extremity thrombolytics check, stent of her SFA, and DCB of the distal bypass.  She states her left leg is doing well.  She is not having any pain.  She is in a rehab facility.  On aspirin Plavix.  Past Medical History:  Diagnosis Date   AAA (abdominal aortic aneurysm) (HCC)    3.1 cm 07/08/18, 3 year follow-up recommended; possible 3 cm AAA by aortogram 09/13/18   Anemia    PMH   Appendicitis with abscess    07/08/18, s/p perc drain; resolved 07/30/18 by CT   Arthritis    Cerebrovascular disease    intra and extracranial vascular dx per MRI 4/11, neurology rec strict CVRF control   Colonic inertia    Constipation    chronic;severe   Depression    Duodenitis    Gastritis    GERD (gastroesophageal reflux disease)    Hypertension    Hypothyroid 01/16/2014   Meningioma (HCC) 10/21/2013   PAD (peripheral artery disease) (HCC)    Psoriasis    sees derm   Wears dentures    Wears glasses     Past Surgical History:  Procedure Laterality Date   ABDOMINAL AORTOGRAM W/LOWER EXTREMITY N/A 09/13/2018   Procedure: ABDOMINAL AORTOGRAM W/LOWER EXTREMITY;  Surgeon: Sherren Kerns, MD;  Location: [facility] INVASIVE CV LAB;  Service: Cardiovascular;  Laterality: N/A;   ABDOMINAL AORTOGRAM W/LOWER EXTREMITY N/A 07/17/2023   Procedure: ABDOMINAL AORTOGRAM W/LOWER EXTREMITY;  Surgeon: Nada Libman, MD;  Location: [facility] INVASIVE CV LAB;   Service: Cardiovascular;  Laterality: N/A;   arthroscopy  04/2010   Right knee   CATARACT EXTRACTION W/ INTRAOCULAR LENS  IMPLANT, BILATERAL     COLONOSCOPY     FEMORAL-POPLITEAL BYPASS GRAFT  10/22/2018   FEMORAL-POPLITEAL BYPASS GRAFT Left 10/22/2018   Procedure: LEFT FEMORAL TO BELOW THE KNEE POPLITEAL ARTERY BYPASS GRAFT;  Surgeon: Sherren Kerns, MD;  Location: [facility] OR;  Service: Vascular;  Laterality: Left;   I & D EXTREMITY Left 11/08/2018   Procedure: IRRIGATION AND DEBRIDEMENT EXTREMITY Left Leg;  Surgeon: Sherren Kerns, MD;  Location: [facility] OR;  Service: Vascular;  Laterality: Left;   IR RADIOLOGIST EVAL & MGMT  07/30/2018   MULTIPLE TOOTH EXTRACTIONS     PERIPHERAL VASCULAR THROMBECTOMY N/A 07/18/2023   Procedure: LYSIS RECHECK;  Surgeon: Victorino Sparrow, MD;  Location: [facility] INVASIVE CV LAB;  Service: Cardiovascular;  Laterality: N/A;   TUBAL LIGATION      Family History  Problem Relation Age of Onset   Breast cancer Mother        metastisis to bones   Cancer Sister 5       spinal   Alcohol abuse Brother    Heart disease Brother    Lung cancer Brother        smoked   Diabetes Son    Heart disease Maternal Aunt    Heart disease Other  grandfather    Colon cancer Neg Hx    Esophageal cancer Neg Hx    Stomach cancer Neg Hx     SOCIAL HISTORY: Social History   Tobacco Use   Smoking status: Every Day    Current packs/day: 0.00    Average packs/day: 0.5 packs/day for 60.0 years (30.0 ttl pk-yrs)    Types: Cigarettes    Start date: 11/10/1959    Last attempt to quit: 11/10/2019    Years since quitting: 3.7   Smokeless tobacco: Never  Substance Use Topics   Alcohol use: Yes    Alcohol/week: 0.0 standard drinks of alcohol    Comment: yes on occassion    Allergies  Allergen Reactions   Lipitor [Atorvastatin] Other (See Comments)    Self reported myalgias   Pravastatin Other (See Comments)    myalgias   Rosuvastatin Other (See Comments)    Self reported  myalgias    Current Outpatient Medications  Medication Sig Dispense Refill   acetaminophen (TYLENOL) 325 MG tablet Take 2 tablets (650 mg total) by mouth every 6 (six) hours as needed for headache or mild pain (pain score 1-3).     albuterol (VENTOLIN HFA) 108 (90 Base) MCG/ACT inhaler Inhale 1-2 puffs into the lungs every 6 (six) hours as needed for wheezing or shortness of breath. 18 g 2   amLODipine (NORVASC) 5 MG tablet Take 1 tablet (5 mg total) by mouth daily. 90 tablet 3   aspirin EC 81 MG tablet Take 81 mg by mouth daily. Swallow whole.     clopidogrel (PLAVIX) 75 MG tablet Take 1 tablet (75 mg total) by mouth daily with breakfast.     DULoxetine (CYMBALTA) 30 MG capsule Take 1 capsule (30 mg total) by mouth daily. 90 capsule 0   ezetimibe (ZETIA) 10 MG tablet Take 1 tablet (10 mg total) by mouth daily.     linaclotide (LINZESS) 290 MCG CAPS capsule Take 1 capsule (290 mcg total) by mouth daily before breakfast.     losartan (COZAAR) 25 MG tablet Take 1 tablet (25 mg total) by mouth daily. 90 tablet 3   polyethylene glycol (MIRALAX / GLYCOLAX) 17 g packet Take 34 g by mouth daily. 14 each 0   PRESCRIPTION MEDICATION Take 1 capsule by mouth daily. Prokinetic. Unknown name and strength.     rOPINIRole (REQUIP) 2 MG tablet TAKE 1 TABLET(2 MG) BY MOUTH AT BEDTIME (Patient taking differently: Take 2 mg by mouth at bedtime.) 60 tablet 0   simethicone (MYLICON) 80 MG chewable tablet Chew 1 tablet (80 mg total) by mouth 4 (four) times daily. 30 tablet 0   No current facility-administered medications for this visit.    REVIEW OF SYSTEMS:  [X]  denotes positive finding, [ ]  denotes negative finding Cardiac  Comments:  Chest pain or chest pressure:    Shortness of breath upon exertion:    Short of breath when lying flat:    Irregular heart rhythm:        Vascular    Pain in calf, thigh, or hip brought on by ambulation:    Pain in feet at night that wakes you up from your sleep:     Blood  clot in your veins:    Leg swelling:         Pulmonary    Oxygen at home:    Productive cough:     Wheezing:         Neurologic    Sudden weakness in arms  or legs:     Sudden numbness in arms or legs:     Sudden onset of difficulty speaking or slurred speech:    Temporary loss of vision in one eye:     Problems with dizziness:         Gastrointestinal    Blood in stool:     Vomited blood:         Genitourinary    Burning when urinating:     Blood in urine:        Psychiatric    Major depression:         Hematologic    Bleeding problems:    Problems with blood clotting too easily:        Skin    Rashes or ulcers:        Constitutional    Fever or chills:      PHYSICAL EXAM: There were no vitals filed for this visit.  GENERAL: The patient is a well-nourished female, in no acute distress. The vital signs are documented above. CARDIAC: There is a regular rate and rhythm.  VASCULAR:  Left femoral pulse palpable Left DP 1+ palpable No lower extremity tissue loss PULMONARY: No respiratory distress. ABDOMEN: Soft and non-tender. MUSCULOSKELETAL: There are no major deformities or cyanosis. NEUROLOGIC: No focal weakness or paresthesias are detected. PSYCHIATRIC: The patient has a normal affect.  DATA:   Left leg arterial duplex 08/24/2023 shows patent left SFA stent with patent left SFA to BK pop bypass  ABI 08/24/23 0.95 right and 0.97 left   Assessment/Plan:   77 y.o. female with multiple medical problems including hypertension, PAD that presents for hospital follow-up.  Recently seen with thrombosed left lower extremity bypass.  She has a history of left SFA to BK pop bypass done with vein in 2020 by Dr. Darrick Penna.  On 07/17/2023 she underwent left lower extremity thrombectomy with initiation of thrombolysis and was taken back on 07/18/2023 for left lower extremity thrombolytics check, stent of her SFA, and DCB of the distal bypass.  Discussed that her duplex shows  her left SFA stent and bypass are patent.  She has a palpable DP pulse in the left foot.  I discussed aspirin Plavix for risk reduction.  I will see her again in 6 months with left lower extremity arterial duplex and ABIs.  Discussed she call with any concerns.  Overall looks good.    Cephus Shelling, MD [facility] Office: [phone]

## 2023-08-28 ENCOUNTER — Ambulatory Visit (INDEPENDENT_AMBULATORY_CARE_PROVIDER_SITE_OTHER): Payer: Medicare Other | Admitting: Vascular Surgery

## 2023-08-28 ENCOUNTER — Encounter: Payer: Self-pay | Admitting: Vascular Surgery

## 2023-08-28 VITALS — BP 149/75 | HR 74 | Temp 97.9°F | Resp 18 | Ht 67.0 in | Wt 164.0 lb

## 2023-08-28 DIAGNOSIS — I69391 Dysphagia following cerebral infarction: Secondary | ICD-10-CM | POA: Diagnosis not present

## 2023-08-28 DIAGNOSIS — M6281 Muscle weakness (generalized): Secondary | ICD-10-CM | POA: Diagnosis not present

## 2023-08-28 DIAGNOSIS — R2689 Other abnormalities of gait and mobility: Secondary | ICD-10-CM | POA: Diagnosis not present

## 2023-08-28 DIAGNOSIS — M6250 Muscle wasting and atrophy, not elsewhere classified, unspecified site: Secondary | ICD-10-CM | POA: Diagnosis not present

## 2023-08-28 DIAGNOSIS — R1312 Dysphagia, oropharyngeal phase: Secondary | ICD-10-CM | POA: Diagnosis not present

## 2023-08-28 DIAGNOSIS — T82868A Thrombosis of vascular prosthetic devices, implants and grafts, initial encounter: Secondary | ICD-10-CM | POA: Diagnosis not present

## 2023-08-28 DIAGNOSIS — I69922 Dysarthria following unspecified cerebrovascular disease: Secondary | ICD-10-CM | POA: Diagnosis not present

## 2023-08-29 ENCOUNTER — Ambulatory Visit: Payer: Medicare Other | Admitting: Psychiatry

## 2023-08-29 DIAGNOSIS — I10 Essential (primary) hypertension: Secondary | ICD-10-CM | POA: Diagnosis not present

## 2023-08-29 DIAGNOSIS — M6250 Muscle wasting and atrophy, not elsewhere classified, unspecified site: Secondary | ICD-10-CM | POA: Diagnosis not present

## 2023-08-29 DIAGNOSIS — I69391 Dysphagia following cerebral infarction: Secondary | ICD-10-CM | POA: Diagnosis not present

## 2023-08-29 DIAGNOSIS — M6281 Muscle weakness (generalized): Secondary | ICD-10-CM | POA: Diagnosis not present

## 2023-08-29 DIAGNOSIS — E785 Hyperlipidemia, unspecified: Secondary | ICD-10-CM | POA: Diagnosis not present

## 2023-08-29 DIAGNOSIS — R1312 Dysphagia, oropharyngeal phase: Secondary | ICD-10-CM | POA: Diagnosis not present

## 2023-08-29 DIAGNOSIS — I69922 Dysarthria following unspecified cerebrovascular disease: Secondary | ICD-10-CM | POA: Diagnosis not present

## 2023-08-29 DIAGNOSIS — Z91199 Patient's noncompliance with other medical treatment and regimen due to unspecified reason: Secondary | ICD-10-CM

## 2023-08-29 DIAGNOSIS — R2689 Other abnormalities of gait and mobility: Secondary | ICD-10-CM | POA: Diagnosis not present

## 2023-08-29 NOTE — Progress Notes (Signed)
Couldn't reach pt for phone appt.  Disc with therapist.  Pt may  now be in rehab

## 2023-08-30 ENCOUNTER — Other Ambulatory Visit: Payer: Self-pay

## 2023-08-30 DIAGNOSIS — I739 Peripheral vascular disease, unspecified: Secondary | ICD-10-CM

## 2023-08-30 DIAGNOSIS — M6281 Muscle weakness (generalized): Secondary | ICD-10-CM | POA: Diagnosis not present

## 2023-08-30 DIAGNOSIS — R1312 Dysphagia, oropharyngeal phase: Secondary | ICD-10-CM | POA: Diagnosis not present

## 2023-08-30 DIAGNOSIS — R2689 Other abnormalities of gait and mobility: Secondary | ICD-10-CM | POA: Diagnosis not present

## 2023-08-30 DIAGNOSIS — I69922 Dysarthria following unspecified cerebrovascular disease: Secondary | ICD-10-CM | POA: Diagnosis not present

## 2023-08-30 DIAGNOSIS — I69391 Dysphagia following cerebral infarction: Secondary | ICD-10-CM | POA: Diagnosis not present

## 2023-08-30 DIAGNOSIS — M6250 Muscle wasting and atrophy, not elsewhere classified, unspecified site: Secondary | ICD-10-CM | POA: Diagnosis not present

## 2023-08-31 ENCOUNTER — Telehealth: Payer: Self-pay | Admitting: Psychiatry

## 2023-08-31 ENCOUNTER — Ambulatory Visit (INDEPENDENT_AMBULATORY_CARE_PROVIDER_SITE_OTHER): Payer: Medicare Other | Admitting: Psychiatry

## 2023-08-31 DIAGNOSIS — I714 Abdominal aortic aneurysm, without rupture, unspecified: Secondary | ICD-10-CM | POA: Diagnosis not present

## 2023-08-31 DIAGNOSIS — G3184 Mild cognitive impairment, so stated: Secondary | ICD-10-CM | POA: Diagnosis not present

## 2023-08-31 DIAGNOSIS — Z9189 Other specified personal risk factors, not elsewhere classified: Secondary | ICD-10-CM

## 2023-08-31 DIAGNOSIS — R1312 Dysphagia, oropharyngeal phase: Secondary | ICD-10-CM | POA: Diagnosis not present

## 2023-08-31 DIAGNOSIS — F17219 Nicotine dependence, cigarettes, with unspecified nicotine-induced disorders: Secondary | ICD-10-CM | POA: Diagnosis not present

## 2023-08-31 DIAGNOSIS — J84112 Idiopathic pulmonary fibrosis: Secondary | ICD-10-CM

## 2023-08-31 DIAGNOSIS — F331 Major depressive disorder, recurrent, moderate: Secondary | ICD-10-CM | POA: Diagnosis not present

## 2023-08-31 DIAGNOSIS — Z8619 Personal history of other infectious and parasitic diseases: Secondary | ICD-10-CM | POA: Diagnosis not present

## 2023-08-31 DIAGNOSIS — M6250 Muscle wasting and atrophy, not elsewhere classified, unspecified site: Secondary | ICD-10-CM | POA: Diagnosis not present

## 2023-08-31 DIAGNOSIS — F411 Generalized anxiety disorder: Secondary | ICD-10-CM

## 2023-08-31 DIAGNOSIS — J439 Emphysema, unspecified: Secondary | ICD-10-CM | POA: Diagnosis not present

## 2023-08-31 DIAGNOSIS — R2689 Other abnormalities of gait and mobility: Secondary | ICD-10-CM | POA: Diagnosis not present

## 2023-08-31 DIAGNOSIS — I69391 Dysphagia following cerebral infarction: Secondary | ICD-10-CM | POA: Diagnosis not present

## 2023-08-31 DIAGNOSIS — Z8673 Personal history of transient ischemic attack (TIA), and cerebral infarction without residual deficits: Secondary | ICD-10-CM

## 2023-08-31 DIAGNOSIS — Z91148 Patient's other noncompliance with medication regimen for other reason: Secondary | ICD-10-CM | POA: Diagnosis not present

## 2023-08-31 DIAGNOSIS — Z0000 Encounter for general adult medical examination without abnormal findings: Secondary | ICD-10-CM | POA: Diagnosis not present

## 2023-08-31 DIAGNOSIS — M6281 Muscle weakness (generalized): Secondary | ICD-10-CM | POA: Diagnosis not present

## 2023-08-31 DIAGNOSIS — F319 Bipolar disorder, unspecified: Secondary | ICD-10-CM | POA: Diagnosis not present

## 2023-08-31 DIAGNOSIS — I69922 Dysarthria following unspecified cerebrovascular disease: Secondary | ICD-10-CM | POA: Diagnosis not present

## 2023-08-31 NOTE — Telephone Encounter (Signed)
Pls note that Dr. Jennelle Human and Dr. Farrel Demark can both  take a call from her son or an advocate he may have hired. We have Mai's verbal consent to talk to either one.

## 2023-08-31 NOTE — Progress Notes (Signed)
 Psychotherapy Progress Note [facility] Jenny Czar, PhD LP  Patient ID: [name])    MRN: [id number] Therapy format: Individual psychotherapy Date: 08/31/2023      Start: 10:15a     Stop: 11:00a     Time Spent: 45 min Location: Telehealth visit -- I connected with this patient by an approved telecommunication method (audio only), with her informed consent, and verifying identity and patient privacy.  I was located at my office and patient at at a rehab facility .  As needed, we discussed the limitations, risks, and security and privacy concerns associated with telehealth service, including the availability and conditions which currently govern in-person appointments and the possibility that 3rd-party payment may not be fully guaranteed and she may be responsible for charges.  After she indicated understanding, we proceeded with the session.  Also discussed treatment planning, as needed, including ongoing verbal agreement with the plan, the opportunity to ask and answer all questions, her demonstrated understanding of instructions, and her readiness to call the office should symptoms worsen or she feels she is in a crisis state and needs more immediate and tangible assistance.   Session narrative (presenting needs, interim history, self-report of stressors and symptoms, applications of prior therapy, status changes, and interventions made in session) Reached Pt by her cell phone -- rather unexpectedly, given recent no-shows for myself and Dr. Jennelle Human.  Voice weak, says she's "in trouble here", begins to sob, c/o bad nurses (CNAs), feels they are neglectful, argue with each other.  Current placement is rehab, after another stroke.  Complains of a doctor who looks like he's from [place].  Says she spoke with a Facilities manager who allegedly said patients are wrong, my girls are right.  Admits that's her interpretation, not a quote, and she could be reading too much in.   Apparently, son [name], her POA, has hired an elder advocate, but it's confusing, and he reportedly has been highly irritated with staff sometimes (noted around last encounter and coordination with PCP).    Probed conditions and plans, best she can understand them, determined that she is most likely to be in rehab for a period of time then in all likelihood should establish in a retirement facility according to her needs for assistance and medical oversight.  She still wants to be in service with Dr. Jennelle Human and myself, encouraged her to bring it up with treatment providers and anyone helping her with placement and coordination.  We will remain willing if it's feasible and helpful.  Encouraged to let both [name] and her advocate know about [place] and took verbal consent to communicate if we are contacted by either.  Challenged to work kindly with the program she has, as the people she meets are all trying to do their job, for her, best they can, and if she feels reactionary to her condition and limitations, it is always best to say that, not play it out on others.  Therapeutic modalities: Cognitive Behavioral Therapy, Solution-Oriented/Positive Psychology, and Ego-Supportive  Mental Status/Observations:  Appearance:   Not assessed     Behavior:  Suspicious and Monopolizing  Motor:  Not assessed  Speech/Language:   Fluent, some articulation issues  Affect:  Not assessed  Mood:  anxious and irritable  Thought process:  normal  Thought content:    Ilusions  Sensory/Perceptual disturbances:    WNL  Orientation:  Fully oriented  Attention:  Good    Concentration:  Fair  Memory:  grossly intact  Insight:    Fair  Judgment:   Fair  Impulse Control:  Fair   Risk Assessment: Danger to Self: No Self-injurious Behavior: No Danger to Others: No Physical Aggression / Violence: No Duty to Warn: No Access to Firearms a concern: No  Assessment of progress:  situational setback(s)  Diagnosis:   ICD-10-CM    1. Major depressive disorder, recurrent episode, moderate (HCC)  F33.1     2. Generalized anxiety disorder  F41.1     3. 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  G31.84     4. History of CVA (2019 lacunar, 2024)  Z86.73     5. Idiopathic pulmonary fibrosis (HCC) (per chart; pt self-describes COPD)  J84.112     6. Noncompliance with medication regimen  Z91.148     7. Nicotine dependence, cigarettes, with unspecified nicotine-induced disorders  F17.219     8. At risk of disease - UTI, decubitus ulcer, others  Z91.89     9. History of sepsis  Z86.19      Plan:  Self- care issues as noted earlier, requires assistance Chronic health risks as noted earlier, requires facility care for the foreseeable future Unable to fully effectively manage her own finances and decision-making, requires assistance of empowered family Available to consult if helpful -- son [name], unnamed patient advocate, or other necessary party; verbal ROI taken.  Available to PCP if remains involved in her care, or to facility if deemed helpful. Other recommendations/advice -- As may be noted above.  Continue to utilize previously learned skills ad lib. Medication compliance -- Maintain medication as prescribed and work faithfully with relevant prescriber(s) if any changes are desired or seem indicated. Crisis service -- Aware of call list and work-in appts.  Call the clinic on-call service, 988/hotline, 911, or present to [facility] or ER if any life-threatening psychiatric crisis. Followup -- Return for time as available.  Next scheduled visit with me Visit date not found.  Next scheduled in this office Visit date not found.  Review of EHR later, after  shows that pt is in a facility and is being served by a PA for behavioral health needs  Robley Fries, PhD Jenny Czar, PhD LP Clinical Psychologist, [facility] [address],  [address] [place], [place] [address] [phone]

## 2023-09-01 DIAGNOSIS — I69391 Dysphagia following cerebral infarction: Secondary | ICD-10-CM | POA: Diagnosis not present

## 2023-09-01 DIAGNOSIS — I69922 Dysarthria following unspecified cerebrovascular disease: Secondary | ICD-10-CM | POA: Diagnosis not present

## 2023-09-01 DIAGNOSIS — M6250 Muscle wasting and atrophy, not elsewhere classified, unspecified site: Secondary | ICD-10-CM | POA: Diagnosis not present

## 2023-09-01 DIAGNOSIS — R2689 Other abnormalities of gait and mobility: Secondary | ICD-10-CM | POA: Diagnosis not present

## 2023-09-01 DIAGNOSIS — R1312 Dysphagia, oropharyngeal phase: Secondary | ICD-10-CM | POA: Diagnosis not present

## 2023-09-01 DIAGNOSIS — M6281 Muscle weakness (generalized): Secondary | ICD-10-CM | POA: Diagnosis not present

## 2023-09-02 DIAGNOSIS — M6281 Muscle weakness (generalized): Secondary | ICD-10-CM | POA: Diagnosis not present

## 2023-09-02 DIAGNOSIS — I69391 Dysphagia following cerebral infarction: Secondary | ICD-10-CM | POA: Diagnosis not present

## 2023-09-02 DIAGNOSIS — M6250 Muscle wasting and atrophy, not elsewhere classified, unspecified site: Secondary | ICD-10-CM | POA: Diagnosis not present

## 2023-09-02 DIAGNOSIS — R2689 Other abnormalities of gait and mobility: Secondary | ICD-10-CM | POA: Diagnosis not present

## 2023-09-02 DIAGNOSIS — R1312 Dysphagia, oropharyngeal phase: Secondary | ICD-10-CM | POA: Diagnosis not present

## 2023-09-02 DIAGNOSIS — I69922 Dysarthria following unspecified cerebrovascular disease: Secondary | ICD-10-CM | POA: Diagnosis not present

## 2023-09-03 DIAGNOSIS — R2689 Other abnormalities of gait and mobility: Secondary | ICD-10-CM | POA: Diagnosis not present

## 2023-09-03 DIAGNOSIS — M6250 Muscle wasting and atrophy, not elsewhere classified, unspecified site: Secondary | ICD-10-CM | POA: Diagnosis not present

## 2023-09-03 DIAGNOSIS — I69922 Dysarthria following unspecified cerebrovascular disease: Secondary | ICD-10-CM | POA: Diagnosis not present

## 2023-09-03 DIAGNOSIS — R1312 Dysphagia, oropharyngeal phase: Secondary | ICD-10-CM | POA: Diagnosis not present

## 2023-09-03 DIAGNOSIS — I69391 Dysphagia following cerebral infarction: Secondary | ICD-10-CM | POA: Diagnosis not present

## 2023-09-03 DIAGNOSIS — M6281 Muscle weakness (generalized): Secondary | ICD-10-CM | POA: Diagnosis not present

## 2023-09-06 DIAGNOSIS — R2689 Other abnormalities of gait and mobility: Secondary | ICD-10-CM | POA: Diagnosis not present

## 2023-09-06 DIAGNOSIS — M6250 Muscle wasting and atrophy, not elsewhere classified, unspecified site: Secondary | ICD-10-CM | POA: Diagnosis not present

## 2023-09-06 DIAGNOSIS — I69391 Dysphagia following cerebral infarction: Secondary | ICD-10-CM | POA: Diagnosis not present

## 2023-09-06 DIAGNOSIS — I69922 Dysarthria following unspecified cerebrovascular disease: Secondary | ICD-10-CM | POA: Diagnosis not present

## 2023-09-06 DIAGNOSIS — M6281 Muscle weakness (generalized): Secondary | ICD-10-CM | POA: Diagnosis not present

## 2023-09-06 DIAGNOSIS — R1312 Dysphagia, oropharyngeal phase: Secondary | ICD-10-CM | POA: Diagnosis not present

## 2023-09-07 DIAGNOSIS — M6281 Muscle weakness (generalized): Secondary | ICD-10-CM | POA: Diagnosis not present

## 2023-09-07 DIAGNOSIS — I69922 Dysarthria following unspecified cerebrovascular disease: Secondary | ICD-10-CM | POA: Diagnosis not present

## 2023-09-07 DIAGNOSIS — M6250 Muscle wasting and atrophy, not elsewhere classified, unspecified site: Secondary | ICD-10-CM | POA: Diagnosis not present

## 2023-09-07 DIAGNOSIS — R1312 Dysphagia, oropharyngeal phase: Secondary | ICD-10-CM | POA: Diagnosis not present

## 2023-09-07 DIAGNOSIS — I69391 Dysphagia following cerebral infarction: Secondary | ICD-10-CM | POA: Diagnosis not present

## 2023-09-07 DIAGNOSIS — R2689 Other abnormalities of gait and mobility: Secondary | ICD-10-CM | POA: Diagnosis not present

## 2023-09-07 NOTE — Telephone Encounter (Signed)
error 

## 2023-09-08 DIAGNOSIS — R2689 Other abnormalities of gait and mobility: Secondary | ICD-10-CM | POA: Diagnosis not present

## 2023-09-08 DIAGNOSIS — M6250 Muscle wasting and atrophy, not elsewhere classified, unspecified site: Secondary | ICD-10-CM | POA: Diagnosis not present

## 2023-09-08 DIAGNOSIS — I69391 Dysphagia following cerebral infarction: Secondary | ICD-10-CM | POA: Diagnosis not present

## 2023-09-08 DIAGNOSIS — M6281 Muscle weakness (generalized): Secondary | ICD-10-CM | POA: Diagnosis not present

## 2023-09-08 DIAGNOSIS — R1312 Dysphagia, oropharyngeal phase: Secondary | ICD-10-CM | POA: Diagnosis not present

## 2023-09-08 DIAGNOSIS — I69922 Dysarthria following unspecified cerebrovascular disease: Secondary | ICD-10-CM | POA: Diagnosis not present

## 2023-09-10 DIAGNOSIS — I69391 Dysphagia following cerebral infarction: Secondary | ICD-10-CM | POA: Diagnosis not present

## 2023-09-10 DIAGNOSIS — I70222 Atherosclerosis of native arteries of extremities with rest pain, left leg: Secondary | ICD-10-CM | POA: Diagnosis not present

## 2023-09-10 DIAGNOSIS — J439 Emphysema, unspecified: Secondary | ICD-10-CM | POA: Diagnosis not present

## 2023-09-10 DIAGNOSIS — M6281 Muscle weakness (generalized): Secondary | ICD-10-CM | POA: Diagnosis not present

## 2023-09-10 DIAGNOSIS — R5381 Other malaise: Secondary | ICD-10-CM | POA: Diagnosis not present

## 2023-09-10 DIAGNOSIS — M6250 Muscle wasting and atrophy, not elsewhere classified, unspecified site: Secondary | ICD-10-CM | POA: Diagnosis not present

## 2023-09-10 DIAGNOSIS — R2689 Other abnormalities of gait and mobility: Secondary | ICD-10-CM | POA: Diagnosis not present

## 2023-09-10 DIAGNOSIS — I69922 Dysarthria following unspecified cerebrovascular disease: Secondary | ICD-10-CM | POA: Diagnosis not present

## 2023-09-10 DIAGNOSIS — I714 Abdominal aortic aneurysm, without rupture, unspecified: Secondary | ICD-10-CM | POA: Diagnosis not present

## 2023-09-10 DIAGNOSIS — R1312 Dysphagia, oropharyngeal phase: Secondary | ICD-10-CM | POA: Diagnosis not present

## 2023-09-11 DIAGNOSIS — I69922 Dysarthria following unspecified cerebrovascular disease: Secondary | ICD-10-CM | POA: Diagnosis not present

## 2023-09-11 DIAGNOSIS — I69391 Dysphagia following cerebral infarction: Secondary | ICD-10-CM | POA: Diagnosis not present

## 2023-09-11 DIAGNOSIS — M6281 Muscle weakness (generalized): Secondary | ICD-10-CM | POA: Diagnosis not present

## 2023-09-11 DIAGNOSIS — R2689 Other abnormalities of gait and mobility: Secondary | ICD-10-CM | POA: Diagnosis not present

## 2023-09-11 DIAGNOSIS — R1312 Dysphagia, oropharyngeal phase: Secondary | ICD-10-CM | POA: Diagnosis not present

## 2023-09-11 DIAGNOSIS — M6250 Muscle wasting and atrophy, not elsewhere classified, unspecified site: Secondary | ICD-10-CM | POA: Diagnosis not present

## 2023-09-12 DIAGNOSIS — R1312 Dysphagia, oropharyngeal phase: Secondary | ICD-10-CM | POA: Diagnosis not present

## 2023-09-12 DIAGNOSIS — I82412 Acute embolism and thrombosis of left femoral vein: Secondary | ICD-10-CM | POA: Diagnosis not present

## 2023-09-12 DIAGNOSIS — M6281 Muscle weakness (generalized): Secondary | ICD-10-CM | POA: Diagnosis not present

## 2023-09-12 DIAGNOSIS — I69922 Dysarthria following unspecified cerebrovascular disease: Secondary | ICD-10-CM | POA: Diagnosis not present

## 2023-09-12 DIAGNOSIS — R2689 Other abnormalities of gait and mobility: Secondary | ICD-10-CM | POA: Diagnosis not present

## 2023-09-12 DIAGNOSIS — I69322 Dysarthria following cerebral infarction: Secondary | ICD-10-CM | POA: Diagnosis not present

## 2023-09-13 DIAGNOSIS — G2581 Restless legs syndrome: Secondary | ICD-10-CM | POA: Diagnosis not present

## 2023-09-13 DIAGNOSIS — K5909 Other constipation: Secondary | ICD-10-CM | POA: Diagnosis not present

## 2023-09-13 DIAGNOSIS — I82412 Acute embolism and thrombosis of left femoral vein: Secondary | ICD-10-CM | POA: Diagnosis not present

## 2023-09-13 DIAGNOSIS — M6281 Muscle weakness (generalized): Secondary | ICD-10-CM | POA: Diagnosis not present

## 2023-09-13 DIAGNOSIS — R1312 Dysphagia, oropharyngeal phase: Secondary | ICD-10-CM | POA: Diagnosis not present

## 2023-09-13 DIAGNOSIS — R2689 Other abnormalities of gait and mobility: Secondary | ICD-10-CM | POA: Diagnosis not present

## 2023-09-13 DIAGNOSIS — I7090 Unspecified atherosclerosis: Secondary | ICD-10-CM | POA: Diagnosis not present

## 2023-09-13 DIAGNOSIS — F319 Bipolar disorder, unspecified: Secondary | ICD-10-CM | POA: Diagnosis not present

## 2023-09-13 DIAGNOSIS — E785 Hyperlipidemia, unspecified: Secondary | ICD-10-CM | POA: Diagnosis not present

## 2023-09-13 DIAGNOSIS — I69922 Dysarthria following unspecified cerebrovascular disease: Secondary | ICD-10-CM | POA: Diagnosis not present

## 2023-09-13 DIAGNOSIS — I739 Peripheral vascular disease, unspecified: Secondary | ICD-10-CM | POA: Diagnosis not present

## 2023-09-13 DIAGNOSIS — I10 Essential (primary) hypertension: Secondary | ICD-10-CM | POA: Diagnosis not present

## 2023-09-13 DIAGNOSIS — I69322 Dysarthria following cerebral infarction: Secondary | ICD-10-CM | POA: Diagnosis not present

## 2023-09-13 DIAGNOSIS — F418 Other specified anxiety disorders: Secondary | ICD-10-CM | POA: Diagnosis not present

## 2023-09-14 DIAGNOSIS — I82412 Acute embolism and thrombosis of left femoral vein: Secondary | ICD-10-CM | POA: Diagnosis not present

## 2023-09-14 DIAGNOSIS — R2689 Other abnormalities of gait and mobility: Secondary | ICD-10-CM | POA: Diagnosis not present

## 2023-09-14 DIAGNOSIS — M6281 Muscle weakness (generalized): Secondary | ICD-10-CM | POA: Diagnosis not present

## 2023-09-14 DIAGNOSIS — I69322 Dysarthria following cerebral infarction: Secondary | ICD-10-CM | POA: Diagnosis not present

## 2023-09-14 DIAGNOSIS — I69922 Dysarthria following unspecified cerebrovascular disease: Secondary | ICD-10-CM | POA: Diagnosis not present

## 2023-09-14 DIAGNOSIS — R1312 Dysphagia, oropharyngeal phase: Secondary | ICD-10-CM | POA: Diagnosis not present

## 2023-09-15 DIAGNOSIS — M6281 Muscle weakness (generalized): Secondary | ICD-10-CM | POA: Diagnosis not present

## 2023-09-15 DIAGNOSIS — I69322 Dysarthria following cerebral infarction: Secondary | ICD-10-CM | POA: Diagnosis not present

## 2023-09-15 DIAGNOSIS — I82412 Acute embolism and thrombosis of left femoral vein: Secondary | ICD-10-CM | POA: Diagnosis not present

## 2023-09-15 DIAGNOSIS — R2689 Other abnormalities of gait and mobility: Secondary | ICD-10-CM | POA: Diagnosis not present

## 2023-09-15 DIAGNOSIS — R1312 Dysphagia, oropharyngeal phase: Secondary | ICD-10-CM | POA: Diagnosis not present

## 2023-09-15 DIAGNOSIS — I69922 Dysarthria following unspecified cerebrovascular disease: Secondary | ICD-10-CM | POA: Diagnosis not present

## 2023-09-16 DIAGNOSIS — I82412 Acute embolism and thrombosis of left femoral vein: Secondary | ICD-10-CM | POA: Diagnosis not present

## 2023-09-16 DIAGNOSIS — R1312 Dysphagia, oropharyngeal phase: Secondary | ICD-10-CM | POA: Diagnosis not present

## 2023-09-16 DIAGNOSIS — I69322 Dysarthria following cerebral infarction: Secondary | ICD-10-CM | POA: Diagnosis not present

## 2023-09-16 DIAGNOSIS — R2689 Other abnormalities of gait and mobility: Secondary | ICD-10-CM | POA: Diagnosis not present

## 2023-09-16 DIAGNOSIS — I69922 Dysarthria following unspecified cerebrovascular disease: Secondary | ICD-10-CM | POA: Diagnosis not present

## 2023-09-16 DIAGNOSIS — M6281 Muscle weakness (generalized): Secondary | ICD-10-CM | POA: Diagnosis not present

## 2023-09-17 DIAGNOSIS — I82412 Acute embolism and thrombosis of left femoral vein: Secondary | ICD-10-CM | POA: Diagnosis not present

## 2023-09-17 DIAGNOSIS — I69322 Dysarthria following cerebral infarction: Secondary | ICD-10-CM | POA: Diagnosis not present

## 2023-09-17 DIAGNOSIS — R2689 Other abnormalities of gait and mobility: Secondary | ICD-10-CM | POA: Diagnosis not present

## 2023-09-17 DIAGNOSIS — M6281 Muscle weakness (generalized): Secondary | ICD-10-CM | POA: Diagnosis not present

## 2023-09-17 DIAGNOSIS — I69922 Dysarthria following unspecified cerebrovascular disease: Secondary | ICD-10-CM | POA: Diagnosis not present

## 2023-09-17 DIAGNOSIS — R1312 Dysphagia, oropharyngeal phase: Secondary | ICD-10-CM | POA: Diagnosis not present

## 2023-09-18 DIAGNOSIS — R2689 Other abnormalities of gait and mobility: Secondary | ICD-10-CM | POA: Diagnosis not present

## 2023-09-18 DIAGNOSIS — M6281 Muscle weakness (generalized): Secondary | ICD-10-CM | POA: Diagnosis not present

## 2023-09-18 DIAGNOSIS — I69922 Dysarthria following unspecified cerebrovascular disease: Secondary | ICD-10-CM | POA: Diagnosis not present

## 2023-09-18 DIAGNOSIS — I82412 Acute embolism and thrombosis of left femoral vein: Secondary | ICD-10-CM | POA: Diagnosis not present

## 2023-09-18 DIAGNOSIS — I69322 Dysarthria following cerebral infarction: Secondary | ICD-10-CM | POA: Diagnosis not present

## 2023-09-18 DIAGNOSIS — R1312 Dysphagia, oropharyngeal phase: Secondary | ICD-10-CM | POA: Diagnosis not present

## 2023-09-18 DIAGNOSIS — N390 Urinary tract infection, site not specified: Secondary | ICD-10-CM | POA: Diagnosis not present

## 2023-09-19 DIAGNOSIS — I69922 Dysarthria following unspecified cerebrovascular disease: Secondary | ICD-10-CM | POA: Diagnosis not present

## 2023-09-19 DIAGNOSIS — R2689 Other abnormalities of gait and mobility: Secondary | ICD-10-CM | POA: Diagnosis not present

## 2023-09-19 DIAGNOSIS — M6281 Muscle weakness (generalized): Secondary | ICD-10-CM | POA: Diagnosis not present

## 2023-09-19 DIAGNOSIS — I82412 Acute embolism and thrombosis of left femoral vein: Secondary | ICD-10-CM | POA: Diagnosis not present

## 2023-09-19 DIAGNOSIS — I69322 Dysarthria following cerebral infarction: Secondary | ICD-10-CM | POA: Diagnosis not present

## 2023-09-19 DIAGNOSIS — F419 Anxiety disorder, unspecified: Secondary | ICD-10-CM | POA: Diagnosis not present

## 2023-09-19 DIAGNOSIS — R1312 Dysphagia, oropharyngeal phase: Secondary | ICD-10-CM | POA: Diagnosis not present

## 2023-09-19 DIAGNOSIS — F32A Depression, unspecified: Secondary | ICD-10-CM | POA: Diagnosis not present

## 2023-09-20 DIAGNOSIS — I82412 Acute embolism and thrombosis of left femoral vein: Secondary | ICD-10-CM | POA: Diagnosis not present

## 2023-09-20 DIAGNOSIS — M6281 Muscle weakness (generalized): Secondary | ICD-10-CM | POA: Diagnosis not present

## 2023-09-20 DIAGNOSIS — I69322 Dysarthria following cerebral infarction: Secondary | ICD-10-CM | POA: Diagnosis not present

## 2023-09-20 DIAGNOSIS — R1312 Dysphagia, oropharyngeal phase: Secondary | ICD-10-CM | POA: Diagnosis not present

## 2023-09-20 DIAGNOSIS — I69922 Dysarthria following unspecified cerebrovascular disease: Secondary | ICD-10-CM | POA: Diagnosis not present

## 2023-09-20 DIAGNOSIS — R2689 Other abnormalities of gait and mobility: Secondary | ICD-10-CM | POA: Diagnosis not present

## 2023-09-21 ENCOUNTER — Emergency Department (HOSPITAL_COMMUNITY)
Admission: EM | Admit: 2023-09-21 | Discharge: 2023-09-21 | Disposition: A | Discharge status: Home / Self Care | Payer: Medicare Other

## 2023-09-21 ENCOUNTER — Other Ambulatory Visit: Payer: Self-pay

## 2023-09-21 ENCOUNTER — Encounter (HOSPITAL_COMMUNITY): Payer: Self-pay

## 2023-09-21 DIAGNOSIS — M79601 Pain in right arm: Secondary | ICD-10-CM | POA: Diagnosis not present

## 2023-09-21 DIAGNOSIS — Z7982 Long term (current) use of aspirin: Secondary | ICD-10-CM | POA: Diagnosis not present

## 2023-09-21 DIAGNOSIS — M542 Cervicalgia: Secondary | ICD-10-CM | POA: Insufficient documentation

## 2023-09-21 DIAGNOSIS — M25519 Pain in unspecified shoulder: Secondary | ICD-10-CM | POA: Diagnosis not present

## 2023-09-21 DIAGNOSIS — Z7401 Bed confinement status: Secondary | ICD-10-CM | POA: Diagnosis not present

## 2023-09-21 DIAGNOSIS — I10 Essential (primary) hypertension: Secondary | ICD-10-CM | POA: Diagnosis not present

## 2023-09-21 MED ORDER — IBUPROFEN 400 MG PO TABS
400.0000 mg | ORAL_TABLET | Freq: Once | ORAL | Status: AC
  Administered 2023-09-21: 400 mg via ORAL
  Filled 2023-09-21: qty 1

## 2023-09-21 MED ORDER — LIDOCAINE 4 % EX PTCH: 1.0000 | MEDICATED_PATCH | CUTANEOUS | 0 refills | Status: AC

## 2023-09-21 MED ORDER — LIDOCAINE 5 % EX PTCH
1.0000 | MEDICATED_PATCH | Freq: Once | CUTANEOUS | Status: DC
  Administered 2023-09-21: 1 via TRANSDERMAL
  Filled 2023-09-21: qty 1

## 2023-09-21 MED ORDER — ACETAMINOPHEN 500 MG PO TABS
1000.0000 mg | ORAL_TABLET | Freq: Once | ORAL | Status: AC
  Administered 2023-09-21: 1000 mg via ORAL
  Filled 2023-09-21: qty 2

## 2023-09-21 MED ORDER — TRAMADOL HCL 50 MG PO TABS: 50.0000 mg | ORAL_TABLET | Freq: Four times a day (QID) | ORAL | 0 refills | Status: DC | PRN

## 2023-09-21 MED ORDER — TRAMADOL HCL 50 MG PO TABS
50.0000 mg | ORAL_TABLET | Freq: Once | ORAL | Status: AC
  Administered 2023-09-21: 50 mg via ORAL
  Filled 2023-09-21: qty 1

## 2023-09-21 NOTE — Discharge Instructions (Addendum)
 Please take Tylenol alternating with ibuprofen for baseline pain control with dosing as directed on the packaging.  May use lidocaine patches as well.  Take tramadol for breakthrough pain.

## 2023-09-21 NOTE — ED Triage Notes (Signed)
 PT BIB EMS from [facility] for nontraumatic shoulder pain, started about a week ago while lying down in bed and hasn't went away.

## 2023-09-21 NOTE — ED Provider Notes (Signed)
 [place] [facility] AT [facility] Provider Note   CSN: [id number] Arrival date & time: 09/21/23  9047     History  Chief Complaint  Patient presents with   Shoulder Pain    [name] is a 78 y.o. female.  78 year old female present emergency department with right sided neck pain radiating down to her shoulder and arms.  Reports that she has had off-and-on pain in her neck for several years.  Seemingly worse over the past week.  No trauma.  Does have a history of prior stroke with residual deficits in her right upper extremity.  No numbness tingling changes in sensation or weakness that is new from her baseline functional use of that extremity.  Has not taking any medication.  Pain typically worse in the morning after she wakes, but does seemingly improve improve throughout the day.   Shoulder Pain      Home Medications Prior to Admission medications   Medication Sig Start Date End Date Taking? Authorizing Provider  lidocaine  (HM LIDOCAINE  PATCH) 4 % Place 1 patch onto the skin daily for 14 days. 09/21/23 10/05/23 Yes Anessia Oakland, Caron PARAS, DO  traMADol  (ULTRAM ) 50 MG tablet Take 1 tablet (50 mg total) by mouth every 6 (six) hours as needed for severe pain (pain score 7-10). 09/21/23  Yes Neysa Caron PARAS, DO  acetaminophen  (TYLENOL ) 325 MG tablet Take 2 tablets (650 mg total) by mouth every 6 (six) hours as needed for headache or mild pain (pain score 1-3). 07/23/23   Christobal Guadalajara, MD  albuterol  (VENTOLIN  HFA) 108 (90 Base) MCG/ACT inhaler Inhale 1-2 puffs into the lungs every 6 (six) hours as needed for wheezing or shortness of breath. 05/31/23   Levora Reyes SAUNDERS, MD  amLODipine  (NORVASC ) 5 MG tablet Take 1 tablet (5 mg total) by mouth daily. 02/07/23   Lavona Agent, MD  aspirin  EC 81 MG tablet Take 81 mg by mouth daily. Swallow whole.    [provider]  clopidogrel  (PLAVIX ) 75 MG tablet Take 1 tablet (75 mg total) by mouth daily with breakfast. 07/24/23    Christobal Guadalajara, MD  DULoxetine  (CYMBALTA ) 30 MG capsule Take 1 capsule (30 mg total) by mouth daily. 07/04/23   Cottle, Lorene KANDICE Raddle., MD  ezetimibe  (ZETIA ) 10 MG tablet Take 1 tablet (10 mg total) by mouth daily. 07/24/23   Christobal Guadalajara, MD  linaclotide  (LINZESS ) 290 MCG CAPS capsule Take 1 capsule (290 mcg total) by mouth daily before breakfast. 07/23/23   Christobal Guadalajara, MD  losartan  (COZAAR ) 25 MG tablet Take 1 tablet (25 mg total) by mouth daily. 02/23/23 02/23/24  Lavona Agent, MD  polyethylene glycol (MIRALAX  / GLYCOLAX ) 17 g packet Take 34 g by mouth daily. 03/08/23   Tobie Yetta HERO, MD  PRESCRIPTION MEDICATION Take 1 capsule by mouth daily. Prokinetic. Unknown name and strength.    [provider]  rOPINIRole  (REQUIP ) 2 MG tablet TAKE 1 TABLET(2 MG) BY MOUTH AT BEDTIME Patient taking differently: Take 2 mg by mouth at bedtime. 03/30/23   Cottle, Lorene KANDICE Raddle., MD  Sennosides 25 MG TABS Take by mouth.    [provider]  simethicone  (MYLICON) 80 MG chewable tablet Chew 1 tablet (80 mg total) by mouth 4 (four) times daily. 03/07/23   Tobie Yetta HERO, MD      Allergies    Lipitor [atorvastatin ], Pravastatin, and Rosuvastatin     Review of Systems   Review of Systems  Physical Exam Updated Vital Signs  BP 135/65   Pulse 69   Temp 97.7 F (36.5 C)   Resp 16   SpO2 100%  Physical Exam Vitals and nursing note reviewed.  Constitutional:      General: She is not in acute distress.    Appearance: She is not toxic-appearing.  HENT:     Head: Normocephalic.     Nose: Nose normal.     Mouth/Throat:     Mouth: Mucous membranes are moist.  Eyes:     Conjunctiva/sclera: Conjunctivae normal.  Cardiovascular:     Rate and Rhythm: Normal rate and regular rhythm.  Pulmonary:     Effort: Pulmonary effort is normal.  Abdominal:     General: Abdomen is flat. There is no distension.  Musculoskeletal:     Cervical back: Neck supple. No tenderness.     Comments: Right upper extremity  equal in size compared to the left.  2+ radial pulses.  Normal sensation.  Some global weakness in right upper extremity compared to the left.  Soft compartments.  Skin:    Capillary Refill: Capillary refill takes less than 2 seconds.  Neurological:     Mental Status: She is alert. Mental status is at baseline.  Psychiatric:        Mood and Affect: Mood normal.        Behavior: Behavior normal.     ED Results / Procedures / Treatments   Labs (all labs ordered are listed, but only abnormal results are displayed) Labs Reviewed - No data to display  EKG None  Radiology No results found.  Procedures Procedures    Medications Ordered in ED Medications  lidocaine  (LIDODERM ) 5 % 1 patch (1 patch Transdermal Patch Applied 09/21/23 1138)  acetaminophen  (TYLENOL ) tablet 1,000 mg (1,000 mg Oral Given 09/21/23 1138)  ibuprofen  (ADVIL ) tablet 400 mg (400 mg Oral Given 09/21/23 1138)  traMADol  (ULTRAM ) tablet 50 mg (50 mg Oral Given 09/21/23 1230)    ED Course/ Medical Decision Making/ A&P                                 Medical Decision Making Is a 78 year old female presenting emergency department for right-sided neck pain radiating down to her shoulder and arm.  She is afebrile vital signs reassuring.  No red flags on exam.  She does have some chronic neck pain, which I suspect is the underlying cause of her radicular type cervical pain.  Low suspicion for acute fracture or cord compressive pathology; therefore forego advanced imaging.  She has not tried any pain medications prior to arrival.  She was given Tylenol  Motrin  and lidocaine  patch with some improvement.  Will give dose of tramadol .  Stable for discharge at this time.  Risk OTC drugs. Prescription drug management.         Final Clinical Impression(s) / ED Diagnoses Final diagnoses:  Cervical pain    Rx / DC Orders ED Discharge Orders          Ordered    traMADol  (ULTRAM ) 50 MG tablet  Every 6 hours PRN         09/21/23 1219    lidocaine  (HM LIDOCAINE  PATCH) 4 %  Every 24 hours        09/21/23 1219              Neysa Caron PARAS, DO 09/21/23 1515

## 2023-09-21 NOTE — ED Notes (Signed)
 Spoke to pts son [name], advised about new prescriptions and that she was leaving with [facility].

## 2023-09-21 NOTE — ED Notes (Signed)
 2 attempts to call report to Eligha Bridegroom, secretary transferred my twice, no answer.

## 2023-09-24 ENCOUNTER — Telehealth: Payer: Self-pay

## 2023-09-24 NOTE — Transitions of Care (Post Inpatient/ED Visit) (Signed)
 09/24/2023  Name: [name] MRN: [id number] DOB: 1946/07/19  Today's TOC FU Call Status: Today's TOC FU Call Status:: Successful TOC FU Call Completed TOC FU Call Complete Date: 09/24/23 Patient's Name and Date of Birth confirmed.  Transition Care Management Follow-up Telephone Call Date of Discharge: 09/21/23 Discharge Facility: [facility]) Type of Discharge: Emergency Department Reason for ED Visit: Other: (Cervical pain) How have you been since you were released from the hospital?: Better Any questions or concerns?: No  Items Reviewed: Did you receive and understand the discharge instructions provided?: Yes Medications obtained,verified, and reconciled?: Yes (Medications Reviewed) Any new allergies since your discharge?: No Dietary orders reviewed?: NA Do you have support at home?: Yes  Medications Reviewed Today: Medications Reviewed Today     Reviewed by Lang Avelina PARAS, CMA (Certified Medical Assistant) on 09/24/23 at 1458  Med List Status: <None>   Medication Order Taking? Sig Documenting Provider Last Dose Status Informant  acetaminophen  (TYLENOL ) 325 MG tablet [id number] No Take 2 tablets (650 mg total) by mouth every 6 (six) hours as needed for headache or mild pain (pain score 1-3). Christobal Guadalajara, MD Taking Active   albuterol  (VENTOLIN  HFA) 108 (90 Base) MCG/ACT inhaler [id number] No Inhale 1-2 puffs into the lungs every 6 (six) hours as needed for wheezing or shortness of breath. Levora Reyes SAUNDERS, MD Taking Active Self, Pharmacy Records  amLODipine  (NORVASC ) 5 MG tablet [id number] No Take 1 tablet (5 mg total) by mouth daily. Lavona Agent, MD Taking Active Self, Pharmacy Records  aspirin  EC 81 MG tablet [id number] No Take 81 mg by mouth daily. Swallow whole. [provider] Taking Active Self, Pharmacy Records  clopidogrel  (PLAVIX ) 75 MG tablet [id number] No Take 1 tablet (75 mg total) by mouth daily with breakfast. Christobal Guadalajara, MD Taking Active    DULoxetine  (CYMBALTA ) 30 MG capsule [id number] No Take 1 capsule (30 mg total) by mouth daily. Cottle, Lorene KANDICE Raddle., MD Taking Active Self, Pharmacy Records  ezetimibe  (ZETIA ) 10 MG tablet [id number] No Take 1 tablet (10 mg total) by mouth daily. Christobal Guadalajara, MD Taking Active   lidocaine  (HM LIDOCAINE  PATCH) 4 % [id number]  Place 1 patch onto the skin daily for 14 days. Neysa Caron PARAS, DO  Active   linaclotide  (LINZESS ) 290 MCG CAPS capsule [id number] No Take 1 capsule (290 mcg total) by mouth daily before breakfast. Christobal Guadalajara, MD Taking Active   losartan  (COZAAR ) 25 MG tablet [id number] No Take 1 tablet (25 mg total) by mouth daily. Lavona Agent, MD Taking Active Self, Pharmacy Records  polyethylene glycol (MIRALAX  / GLYCOLAX ) 17 g packet [id number] No Take 34 g by mouth daily. Tobie Yetta HERO, MD Taking Active Self, Pharmacy Records  PRESCRIPTION MEDICATION [id number] No Take 1 capsule by mouth daily. Prokinetic. Unknown name and strength. [provider] 07/15/2023 Active Self, Pharmacy Records  rOPINIRole  (REQUIP ) 2 MG tablet [id number] No TAKE 1 TABLET(2 MG) BY MOUTH AT BEDTIME  Patient taking differently: Take 2 mg by mouth at bedtime.   Cottle, Lorene KANDICE Raddle., MD Taking Active Self, Pharmacy Records  Sennosides 25 MG TABS [id number] No Take by mouth. [provider] Taking Active   simethicone  (MYLICON) 80 MG chewable tablet [id number] No Chew 1 tablet (80 mg total) by mouth 4 (four) times daily. Tobie Yetta HERO, MD Taking Active Self, Pharmacy Records  traMADol  (ULTRAM ) 50 MG tablet [id number]  Take 1 tablet (50 mg total) by mouth every 6 (six) hours as needed for severe pain (  pain score 7-10). Neysa Caron PARAS, DO  Active             Home Care and Equipment/Supplies: Were Home Health Services Ordered?: NA Any new equipment or medical supplies ordered?: NA  Functional Questionnaire: Do you need assistance with bathing/showering or dressing?: No Do you need assistance with  meal preparation?: No Do you need assistance with eating?: No Do you have difficulty maintaining continence: No Do you need assistance with getting out of bed/getting out of a chair/moving?: No Do you have difficulty managing or taking your medications?: No  Follow up appointments reviewed: PCP Follow-up appointment confirmed?: No (pt declined- will call back) MD Provider Line Number:[phone] Given: Yes Specialist Hospital Follow-up appointment confirmed?: NA Do you need transportation to your follow-up appointment?: No Do you understand care options if your condition(s) worsen?: Yes-patient verbalized understanding    SIGNATURE Avelina Essex, CMA (AAMA)  [facility] [phone]

## 2023-09-25 DIAGNOSIS — R1312 Dysphagia, oropharyngeal phase: Secondary | ICD-10-CM | POA: Diagnosis not present

## 2023-09-25 DIAGNOSIS — R2689 Other abnormalities of gait and mobility: Secondary | ICD-10-CM | POA: Diagnosis not present

## 2023-09-25 DIAGNOSIS — I69322 Dysarthria following cerebral infarction: Secondary | ICD-10-CM | POA: Diagnosis not present

## 2023-09-25 DIAGNOSIS — I69922 Dysarthria following unspecified cerebrovascular disease: Secondary | ICD-10-CM | POA: Diagnosis not present

## 2023-09-25 DIAGNOSIS — M6281 Muscle weakness (generalized): Secondary | ICD-10-CM | POA: Diagnosis not present

## 2023-09-25 DIAGNOSIS — I82412 Acute embolism and thrombosis of left femoral vein: Secondary | ICD-10-CM | POA: Diagnosis not present

## 2023-09-26 DIAGNOSIS — I82412 Acute embolism and thrombosis of left femoral vein: Secondary | ICD-10-CM | POA: Diagnosis not present

## 2023-09-26 DIAGNOSIS — F419 Anxiety disorder, unspecified: Secondary | ICD-10-CM | POA: Diagnosis not present

## 2023-09-26 DIAGNOSIS — I69322 Dysarthria following cerebral infarction: Secondary | ICD-10-CM | POA: Diagnosis not present

## 2023-09-26 DIAGNOSIS — I69922 Dysarthria following unspecified cerebrovascular disease: Secondary | ICD-10-CM | POA: Diagnosis not present

## 2023-09-26 DIAGNOSIS — F32A Depression, unspecified: Secondary | ICD-10-CM | POA: Diagnosis not present

## 2023-09-26 DIAGNOSIS — R2689 Other abnormalities of gait and mobility: Secondary | ICD-10-CM | POA: Diagnosis not present

## 2023-09-26 DIAGNOSIS — R1312 Dysphagia, oropharyngeal phase: Secondary | ICD-10-CM | POA: Diagnosis not present

## 2023-09-26 DIAGNOSIS — M6281 Muscle weakness (generalized): Secondary | ICD-10-CM | POA: Diagnosis not present

## 2023-09-28 DIAGNOSIS — R2689 Other abnormalities of gait and mobility: Secondary | ICD-10-CM | POA: Diagnosis not present

## 2023-09-28 DIAGNOSIS — I82412 Acute embolism and thrombosis of left femoral vein: Secondary | ICD-10-CM | POA: Diagnosis not present

## 2023-09-28 DIAGNOSIS — M6281 Muscle weakness (generalized): Secondary | ICD-10-CM | POA: Diagnosis not present

## 2023-09-28 DIAGNOSIS — I69922 Dysarthria following unspecified cerebrovascular disease: Secondary | ICD-10-CM | POA: Diagnosis not present

## 2023-09-28 DIAGNOSIS — I69322 Dysarthria following cerebral infarction: Secondary | ICD-10-CM | POA: Diagnosis not present

## 2023-09-28 DIAGNOSIS — R1312 Dysphagia, oropharyngeal phase: Secondary | ICD-10-CM | POA: Diagnosis not present

## 2023-09-29 DIAGNOSIS — R2689 Other abnormalities of gait and mobility: Secondary | ICD-10-CM | POA: Diagnosis not present

## 2023-09-29 DIAGNOSIS — R1312 Dysphagia, oropharyngeal phase: Secondary | ICD-10-CM | POA: Diagnosis not present

## 2023-09-29 DIAGNOSIS — M6281 Muscle weakness (generalized): Secondary | ICD-10-CM | POA: Diagnosis not present

## 2023-09-29 DIAGNOSIS — I82412 Acute embolism and thrombosis of left femoral vein: Secondary | ICD-10-CM | POA: Diagnosis not present

## 2023-09-29 DIAGNOSIS — I69322 Dysarthria following cerebral infarction: Secondary | ICD-10-CM | POA: Diagnosis not present

## 2023-09-29 DIAGNOSIS — I69922 Dysarthria following unspecified cerebrovascular disease: Secondary | ICD-10-CM | POA: Diagnosis not present

## 2023-10-01 DIAGNOSIS — M6281 Muscle weakness (generalized): Secondary | ICD-10-CM | POA: Diagnosis not present

## 2023-10-01 DIAGNOSIS — I69322 Dysarthria following cerebral infarction: Secondary | ICD-10-CM | POA: Diagnosis not present

## 2023-10-01 DIAGNOSIS — R1312 Dysphagia, oropharyngeal phase: Secondary | ICD-10-CM | POA: Diagnosis not present

## 2023-10-01 DIAGNOSIS — R2689 Other abnormalities of gait and mobility: Secondary | ICD-10-CM | POA: Diagnosis not present

## 2023-10-01 DIAGNOSIS — I69922 Dysarthria following unspecified cerebrovascular disease: Secondary | ICD-10-CM | POA: Diagnosis not present

## 2023-10-01 DIAGNOSIS — I82412 Acute embolism and thrombosis of left femoral vein: Secondary | ICD-10-CM | POA: Diagnosis not present

## 2023-10-02 DIAGNOSIS — R079 Chest pain, unspecified: Secondary | ICD-10-CM | POA: Diagnosis not present

## 2023-10-02 DIAGNOSIS — Z7401 Bed confinement status: Secondary | ICD-10-CM | POA: Diagnosis not present

## 2023-10-02 DIAGNOSIS — R000 Tachycardia, unspecified: Secondary | ICD-10-CM | POA: Diagnosis not present

## 2023-10-02 DIAGNOSIS — Z86718 Personal history of other venous thrombosis and embolism: Secondary | ICD-10-CM | POA: Diagnosis not present

## 2023-10-02 DIAGNOSIS — I7123 Aneurysm of the descending thoracic aorta, without rupture: Secondary | ICD-10-CM | POA: Diagnosis not present

## 2023-10-02 DIAGNOSIS — J432 Centrilobular emphysema: Secondary | ICD-10-CM | POA: Diagnosis not present

## 2023-10-02 DIAGNOSIS — I309 Acute pericarditis, unspecified: Secondary | ICD-10-CM | POA: Diagnosis not present

## 2023-10-02 DIAGNOSIS — I3139 Other pericardial effusion (noninflammatory): Secondary | ICD-10-CM | POA: Diagnosis not present

## 2023-10-02 DIAGNOSIS — J849 Interstitial pulmonary disease, unspecified: Secondary | ICD-10-CM | POA: Diagnosis not present

## 2023-10-02 DIAGNOSIS — Z91148 Patient's other noncompliance with medication regimen for other reason: Secondary | ICD-10-CM | POA: Diagnosis not present

## 2023-10-02 DIAGNOSIS — R0902 Hypoxemia: Secondary | ICD-10-CM | POA: Diagnosis not present

## 2023-10-05 ENCOUNTER — Ambulatory Visit: Payer: Medicare Other | Admitting: Gastroenterology

## 2023-10-05 DIAGNOSIS — B351 Tinea unguium: Secondary | ICD-10-CM | POA: Diagnosis not present

## 2023-10-05 DIAGNOSIS — I69322 Dysarthria following cerebral infarction: Secondary | ICD-10-CM | POA: Diagnosis not present

## 2023-10-05 DIAGNOSIS — I7091 Generalized atherosclerosis: Secondary | ICD-10-CM | POA: Diagnosis not present

## 2023-10-05 DIAGNOSIS — R2689 Other abnormalities of gait and mobility: Secondary | ICD-10-CM | POA: Diagnosis not present

## 2023-10-05 DIAGNOSIS — M6281 Muscle weakness (generalized): Secondary | ICD-10-CM | POA: Diagnosis not present

## 2023-10-05 DIAGNOSIS — I69922 Dysarthria following unspecified cerebrovascular disease: Secondary | ICD-10-CM | POA: Diagnosis not present

## 2023-10-05 DIAGNOSIS — R1312 Dysphagia, oropharyngeal phase: Secondary | ICD-10-CM | POA: Diagnosis not present

## 2023-10-05 DIAGNOSIS — M2042 Other hammer toe(s) (acquired), left foot: Secondary | ICD-10-CM | POA: Diagnosis not present

## 2023-10-05 DIAGNOSIS — M2041 Other hammer toe(s) (acquired), right foot: Secondary | ICD-10-CM | POA: Diagnosis not present

## 2023-10-05 DIAGNOSIS — I82412 Acute embolism and thrombosis of left femoral vein: Secondary | ICD-10-CM | POA: Diagnosis not present

## 2023-10-06 DIAGNOSIS — R2689 Other abnormalities of gait and mobility: Secondary | ICD-10-CM | POA: Diagnosis not present

## 2023-10-06 DIAGNOSIS — M6281 Muscle weakness (generalized): Secondary | ICD-10-CM | POA: Diagnosis not present

## 2023-10-06 DIAGNOSIS — I69922 Dysarthria following unspecified cerebrovascular disease: Secondary | ICD-10-CM | POA: Diagnosis not present

## 2023-10-06 DIAGNOSIS — I82412 Acute embolism and thrombosis of left femoral vein: Secondary | ICD-10-CM | POA: Diagnosis not present

## 2023-10-06 DIAGNOSIS — I69322 Dysarthria following cerebral infarction: Secondary | ICD-10-CM | POA: Diagnosis not present

## 2023-10-06 DIAGNOSIS — R1312 Dysphagia, oropharyngeal phase: Secondary | ICD-10-CM | POA: Diagnosis not present

## 2023-10-08 ENCOUNTER — Ambulatory Visit: Payer: Medicare Other | Admitting: Gastroenterology

## 2023-10-08 DIAGNOSIS — I69922 Dysarthria following unspecified cerebrovascular disease: Secondary | ICD-10-CM | POA: Diagnosis not present

## 2023-10-08 DIAGNOSIS — R2689 Other abnormalities of gait and mobility: Secondary | ICD-10-CM | POA: Diagnosis not present

## 2023-10-08 DIAGNOSIS — I82412 Acute embolism and thrombosis of left femoral vein: Secondary | ICD-10-CM | POA: Diagnosis not present

## 2023-10-08 DIAGNOSIS — R1312 Dysphagia, oropharyngeal phase: Secondary | ICD-10-CM | POA: Diagnosis not present

## 2023-10-08 DIAGNOSIS — M6281 Muscle weakness (generalized): Secondary | ICD-10-CM | POA: Diagnosis not present

## 2023-10-08 DIAGNOSIS — I69322 Dysarthria following cerebral infarction: Secondary | ICD-10-CM | POA: Diagnosis not present

## 2023-10-09 DIAGNOSIS — R1312 Dysphagia, oropharyngeal phase: Secondary | ICD-10-CM | POA: Diagnosis not present

## 2023-10-09 DIAGNOSIS — M6281 Muscle weakness (generalized): Secondary | ICD-10-CM | POA: Diagnosis not present

## 2023-10-09 DIAGNOSIS — I69322 Dysarthria following cerebral infarction: Secondary | ICD-10-CM | POA: Diagnosis not present

## 2023-10-09 DIAGNOSIS — I69922 Dysarthria following unspecified cerebrovascular disease: Secondary | ICD-10-CM | POA: Diagnosis not present

## 2023-10-09 DIAGNOSIS — R2689 Other abnormalities of gait and mobility: Secondary | ICD-10-CM | POA: Diagnosis not present

## 2023-10-09 DIAGNOSIS — I82412 Acute embolism and thrombosis of left femoral vein: Secondary | ICD-10-CM | POA: Diagnosis not present

## 2023-10-10 DIAGNOSIS — I82412 Acute embolism and thrombosis of left femoral vein: Secondary | ICD-10-CM | POA: Diagnosis not present

## 2023-10-10 DIAGNOSIS — R2689 Other abnormalities of gait and mobility: Secondary | ICD-10-CM | POA: Diagnosis not present

## 2023-10-10 DIAGNOSIS — M6281 Muscle weakness (generalized): Secondary | ICD-10-CM | POA: Diagnosis not present

## 2023-10-10 DIAGNOSIS — R1312 Dysphagia, oropharyngeal phase: Secondary | ICD-10-CM | POA: Diagnosis not present

## 2023-10-10 DIAGNOSIS — I69322 Dysarthria following cerebral infarction: Secondary | ICD-10-CM | POA: Diagnosis not present

## 2023-10-10 DIAGNOSIS — I69922 Dysarthria following unspecified cerebrovascular disease: Secondary | ICD-10-CM | POA: Diagnosis not present

## 2023-10-11 DIAGNOSIS — I69322 Dysarthria following cerebral infarction: Secondary | ICD-10-CM | POA: Diagnosis not present

## 2023-10-11 DIAGNOSIS — M6281 Muscle weakness (generalized): Secondary | ICD-10-CM | POA: Diagnosis not present

## 2023-10-11 DIAGNOSIS — R2689 Other abnormalities of gait and mobility: Secondary | ICD-10-CM | POA: Diagnosis not present

## 2023-10-11 DIAGNOSIS — R1312 Dysphagia, oropharyngeal phase: Secondary | ICD-10-CM | POA: Diagnosis not present

## 2023-10-11 DIAGNOSIS — I82412 Acute embolism and thrombosis of left femoral vein: Secondary | ICD-10-CM | POA: Diagnosis not present

## 2023-10-11 DIAGNOSIS — I69922 Dysarthria following unspecified cerebrovascular disease: Secondary | ICD-10-CM | POA: Diagnosis not present

## 2023-10-12 ENCOUNTER — Other Ambulatory Visit: Payer: Self-pay | Admitting: Psychiatry

## 2023-10-12 NOTE — Telephone Encounter (Signed)
Please schedule pt an appt lv 02/01/23 ns in Dec.

## 2023-10-14 DIAGNOSIS — I639 Cerebral infarction, unspecified: Secondary | ICD-10-CM | POA: Diagnosis not present

## 2023-10-14 DIAGNOSIS — R2689 Other abnormalities of gait and mobility: Secondary | ICD-10-CM | POA: Diagnosis not present

## 2023-10-14 DIAGNOSIS — M6281 Muscle weakness (generalized): Secondary | ICD-10-CM | POA: Diagnosis not present

## 2023-10-15 DIAGNOSIS — I639 Cerebral infarction, unspecified: Secondary | ICD-10-CM | POA: Diagnosis not present

## 2023-10-15 DIAGNOSIS — R2689 Other abnormalities of gait and mobility: Secondary | ICD-10-CM | POA: Diagnosis not present

## 2023-10-15 DIAGNOSIS — M6281 Muscle weakness (generalized): Secondary | ICD-10-CM | POA: Diagnosis not present

## 2023-10-16 DIAGNOSIS — I639 Cerebral infarction, unspecified: Secondary | ICD-10-CM | POA: Diagnosis not present

## 2023-10-16 DIAGNOSIS — M6281 Muscle weakness (generalized): Secondary | ICD-10-CM | POA: Diagnosis not present

## 2023-10-16 DIAGNOSIS — R2689 Other abnormalities of gait and mobility: Secondary | ICD-10-CM | POA: Diagnosis not present

## 2023-10-17 DIAGNOSIS — R2689 Other abnormalities of gait and mobility: Secondary | ICD-10-CM | POA: Diagnosis not present

## 2023-10-17 DIAGNOSIS — M6281 Muscle weakness (generalized): Secondary | ICD-10-CM | POA: Diagnosis not present

## 2023-10-17 DIAGNOSIS — I639 Cerebral infarction, unspecified: Secondary | ICD-10-CM | POA: Diagnosis not present

## 2023-10-18 DIAGNOSIS — I639 Cerebral infarction, unspecified: Secondary | ICD-10-CM | POA: Diagnosis not present

## 2023-10-18 DIAGNOSIS — R2689 Other abnormalities of gait and mobility: Secondary | ICD-10-CM | POA: Diagnosis not present

## 2023-10-18 DIAGNOSIS — M6281 Muscle weakness (generalized): Secondary | ICD-10-CM | POA: Diagnosis not present

## 2023-10-19 DIAGNOSIS — I639 Cerebral infarction, unspecified: Secondary | ICD-10-CM | POA: Diagnosis not present

## 2023-10-19 DIAGNOSIS — M6281 Muscle weakness (generalized): Secondary | ICD-10-CM | POA: Diagnosis not present

## 2023-10-19 DIAGNOSIS — R2689 Other abnormalities of gait and mobility: Secondary | ICD-10-CM | POA: Diagnosis not present

## 2023-10-23 DIAGNOSIS — N183 Chronic kidney disease, stage 3 unspecified: Secondary | ICD-10-CM | POA: Diagnosis not present

## 2023-10-23 DIAGNOSIS — I639 Cerebral infarction, unspecified: Secondary | ICD-10-CM | POA: Diagnosis not present

## 2023-10-23 DIAGNOSIS — M6281 Muscle weakness (generalized): Secondary | ICD-10-CM | POA: Diagnosis not present

## 2023-10-23 DIAGNOSIS — Z9981 Dependence on supplemental oxygen: Secondary | ICD-10-CM | POA: Diagnosis not present

## 2023-10-23 DIAGNOSIS — J8410 Pulmonary fibrosis, unspecified: Secondary | ICD-10-CM | POA: Diagnosis not present

## 2023-10-23 DIAGNOSIS — J432 Centrilobular emphysema: Secondary | ICD-10-CM | POA: Diagnosis not present

## 2023-10-23 DIAGNOSIS — G2581 Restless legs syndrome: Secondary | ICD-10-CM | POA: Diagnosis not present

## 2023-10-23 DIAGNOSIS — I69398 Other sequelae of cerebral infarction: Secondary | ICD-10-CM | POA: Diagnosis not present

## 2023-10-23 DIAGNOSIS — I70722 Atherosclerosis of other type of bypass graft(s) of the extremities with rest pain, left leg: Secondary | ICD-10-CM | POA: Diagnosis not present

## 2023-10-23 DIAGNOSIS — D329 Benign neoplasm of meninges, unspecified: Secondary | ICD-10-CM | POA: Diagnosis not present

## 2023-10-24 DIAGNOSIS — F32A Depression, unspecified: Secondary | ICD-10-CM | POA: Diagnosis not present

## 2023-10-24 DIAGNOSIS — I70722 Atherosclerosis of other type of bypass graft(s) of the extremities with rest pain, left leg: Secondary | ICD-10-CM | POA: Diagnosis not present

## 2023-10-24 DIAGNOSIS — G2581 Restless legs syndrome: Secondary | ICD-10-CM | POA: Diagnosis not present

## 2023-10-24 DIAGNOSIS — I639 Cerebral infarction, unspecified: Secondary | ICD-10-CM | POA: Diagnosis not present

## 2023-10-24 DIAGNOSIS — J432 Centrilobular emphysema: Secondary | ICD-10-CM | POA: Diagnosis not present

## 2023-10-24 DIAGNOSIS — F419 Anxiety disorder, unspecified: Secondary | ICD-10-CM | POA: Diagnosis not present

## 2023-10-24 DIAGNOSIS — I69398 Other sequelae of cerebral infarction: Secondary | ICD-10-CM | POA: Diagnosis not present

## 2023-10-24 DIAGNOSIS — J8410 Pulmonary fibrosis, unspecified: Secondary | ICD-10-CM | POA: Diagnosis not present

## 2023-10-25 DIAGNOSIS — J432 Centrilobular emphysema: Secondary | ICD-10-CM | POA: Diagnosis not present

## 2023-10-25 DIAGNOSIS — I739 Peripheral vascular disease, unspecified: Secondary | ICD-10-CM | POA: Diagnosis not present

## 2023-10-25 DIAGNOSIS — M542 Cervicalgia: Secondary | ICD-10-CM | POA: Diagnosis not present

## 2023-10-25 DIAGNOSIS — I712 Thoracic aortic aneurysm, without rupture, unspecified: Secondary | ICD-10-CM | POA: Diagnosis not present

## 2023-10-25 DIAGNOSIS — I70722 Atherosclerosis of other type of bypass graft(s) of the extremities with rest pain, left leg: Secondary | ICD-10-CM | POA: Diagnosis not present

## 2023-10-25 DIAGNOSIS — E785 Hyperlipidemia, unspecified: Secondary | ICD-10-CM | POA: Diagnosis not present

## 2023-10-25 DIAGNOSIS — J8410 Pulmonary fibrosis, unspecified: Secondary | ICD-10-CM | POA: Diagnosis not present

## 2023-10-25 DIAGNOSIS — I69398 Other sequelae of cerebral infarction: Secondary | ICD-10-CM | POA: Diagnosis not present

## 2023-10-25 DIAGNOSIS — F319 Bipolar disorder, unspecified: Secondary | ICD-10-CM | POA: Diagnosis not present

## 2023-10-25 DIAGNOSIS — G2581 Restless legs syndrome: Secondary | ICD-10-CM | POA: Diagnosis not present

## 2023-10-25 DIAGNOSIS — I639 Cerebral infarction, unspecified: Secondary | ICD-10-CM | POA: Diagnosis not present

## 2023-10-25 DIAGNOSIS — I10 Essential (primary) hypertension: Secondary | ICD-10-CM | POA: Diagnosis not present

## 2023-10-26 DIAGNOSIS — I639 Cerebral infarction, unspecified: Secondary | ICD-10-CM | POA: Diagnosis not present

## 2023-10-26 DIAGNOSIS — J432 Centrilobular emphysema: Secondary | ICD-10-CM | POA: Diagnosis not present

## 2023-10-26 DIAGNOSIS — I69398 Other sequelae of cerebral infarction: Secondary | ICD-10-CM | POA: Diagnosis not present

## 2023-10-26 DIAGNOSIS — J8410 Pulmonary fibrosis, unspecified: Secondary | ICD-10-CM | POA: Diagnosis not present

## 2023-10-26 DIAGNOSIS — I70722 Atherosclerosis of other type of bypass graft(s) of the extremities with rest pain, left leg: Secondary | ICD-10-CM | POA: Diagnosis not present

## 2023-10-26 DIAGNOSIS — G2581 Restless legs syndrome: Secondary | ICD-10-CM | POA: Diagnosis not present

## 2023-10-27 DIAGNOSIS — I70722 Atherosclerosis of other type of bypass graft(s) of the extremities with rest pain, left leg: Secondary | ICD-10-CM | POA: Diagnosis not present

## 2023-10-27 DIAGNOSIS — J8410 Pulmonary fibrosis, unspecified: Secondary | ICD-10-CM | POA: Diagnosis not present

## 2023-10-27 DIAGNOSIS — G2581 Restless legs syndrome: Secondary | ICD-10-CM | POA: Diagnosis not present

## 2023-10-27 DIAGNOSIS — I639 Cerebral infarction, unspecified: Secondary | ICD-10-CM | POA: Diagnosis not present

## 2023-10-27 DIAGNOSIS — J432 Centrilobular emphysema: Secondary | ICD-10-CM | POA: Diagnosis not present

## 2023-10-27 DIAGNOSIS — I69398 Other sequelae of cerebral infarction: Secondary | ICD-10-CM | POA: Diagnosis not present

## 2023-10-28 DIAGNOSIS — I70722 Atherosclerosis of other type of bypass graft(s) of the extremities with rest pain, left leg: Secondary | ICD-10-CM | POA: Diagnosis not present

## 2023-10-28 DIAGNOSIS — J8410 Pulmonary fibrosis, unspecified: Secondary | ICD-10-CM | POA: Diagnosis not present

## 2023-10-28 DIAGNOSIS — I69398 Other sequelae of cerebral infarction: Secondary | ICD-10-CM | POA: Diagnosis not present

## 2023-10-28 DIAGNOSIS — G2581 Restless legs syndrome: Secondary | ICD-10-CM | POA: Diagnosis not present

## 2023-10-28 DIAGNOSIS — J432 Centrilobular emphysema: Secondary | ICD-10-CM | POA: Diagnosis not present

## 2023-10-28 DIAGNOSIS — I639 Cerebral infarction, unspecified: Secondary | ICD-10-CM | POA: Diagnosis not present

## 2023-10-30 DIAGNOSIS — I70722 Atherosclerosis of other type of bypass graft(s) of the extremities with rest pain, left leg: Secondary | ICD-10-CM | POA: Diagnosis not present

## 2023-10-30 DIAGNOSIS — J432 Centrilobular emphysema: Secondary | ICD-10-CM | POA: Diagnosis not present

## 2023-10-30 DIAGNOSIS — I639 Cerebral infarction, unspecified: Secondary | ICD-10-CM | POA: Diagnosis not present

## 2023-10-30 DIAGNOSIS — G2581 Restless legs syndrome: Secondary | ICD-10-CM | POA: Diagnosis not present

## 2023-10-30 DIAGNOSIS — I69398 Other sequelae of cerebral infarction: Secondary | ICD-10-CM | POA: Diagnosis not present

## 2023-10-30 DIAGNOSIS — J8410 Pulmonary fibrosis, unspecified: Secondary | ICD-10-CM | POA: Diagnosis not present

## 2023-10-31 DIAGNOSIS — I69398 Other sequelae of cerebral infarction: Secondary | ICD-10-CM | POA: Diagnosis not present

## 2023-10-31 DIAGNOSIS — G2581 Restless legs syndrome: Secondary | ICD-10-CM | POA: Diagnosis not present

## 2023-10-31 DIAGNOSIS — I639 Cerebral infarction, unspecified: Secondary | ICD-10-CM | POA: Diagnosis not present

## 2023-10-31 DIAGNOSIS — I70722 Atherosclerosis of other type of bypass graft(s) of the extremities with rest pain, left leg: Secondary | ICD-10-CM | POA: Diagnosis not present

## 2023-10-31 DIAGNOSIS — J432 Centrilobular emphysema: Secondary | ICD-10-CM | POA: Diagnosis not present

## 2023-10-31 DIAGNOSIS — J8410 Pulmonary fibrosis, unspecified: Secondary | ICD-10-CM | POA: Diagnosis not present

## 2023-11-02 DIAGNOSIS — M79601 Pain in right arm: Secondary | ICD-10-CM | POA: Diagnosis not present

## 2023-11-02 DIAGNOSIS — I10 Essential (primary) hypertension: Secondary | ICD-10-CM | POA: Diagnosis not present

## 2023-11-02 DIAGNOSIS — I639 Cerebral infarction, unspecified: Secondary | ICD-10-CM | POA: Diagnosis not present

## 2023-11-06 DIAGNOSIS — I70722 Atherosclerosis of other type of bypass graft(s) of the extremities with rest pain, left leg: Secondary | ICD-10-CM | POA: Diagnosis not present

## 2023-11-06 DIAGNOSIS — J432 Centrilobular emphysema: Secondary | ICD-10-CM | POA: Diagnosis not present

## 2023-11-06 DIAGNOSIS — I69398 Other sequelae of cerebral infarction: Secondary | ICD-10-CM | POA: Diagnosis not present

## 2023-11-06 DIAGNOSIS — G2581 Restless legs syndrome: Secondary | ICD-10-CM | POA: Diagnosis not present

## 2023-11-06 DIAGNOSIS — M79601 Pain in right arm: Secondary | ICD-10-CM | POA: Diagnosis not present

## 2023-11-06 DIAGNOSIS — J8410 Pulmonary fibrosis, unspecified: Secondary | ICD-10-CM | POA: Diagnosis not present

## 2023-11-06 DIAGNOSIS — I639 Cerebral infarction, unspecified: Secondary | ICD-10-CM | POA: Diagnosis not present

## 2023-11-07 DIAGNOSIS — G2581 Restless legs syndrome: Secondary | ICD-10-CM | POA: Diagnosis not present

## 2023-11-07 DIAGNOSIS — J432 Centrilobular emphysema: Secondary | ICD-10-CM | POA: Diagnosis not present

## 2023-11-07 DIAGNOSIS — I69398 Other sequelae of cerebral infarction: Secondary | ICD-10-CM | POA: Diagnosis not present

## 2023-11-07 DIAGNOSIS — J8410 Pulmonary fibrosis, unspecified: Secondary | ICD-10-CM | POA: Diagnosis not present

## 2023-11-07 DIAGNOSIS — I70722 Atherosclerosis of other type of bypass graft(s) of the extremities with rest pain, left leg: Secondary | ICD-10-CM | POA: Diagnosis not present

## 2023-11-07 DIAGNOSIS — I639 Cerebral infarction, unspecified: Secondary | ICD-10-CM | POA: Diagnosis not present

## 2023-11-08 DIAGNOSIS — I70722 Atherosclerosis of other type of bypass graft(s) of the extremities with rest pain, left leg: Secondary | ICD-10-CM | POA: Diagnosis not present

## 2023-11-08 DIAGNOSIS — J432 Centrilobular emphysema: Secondary | ICD-10-CM | POA: Diagnosis not present

## 2023-11-08 DIAGNOSIS — I639 Cerebral infarction, unspecified: Secondary | ICD-10-CM | POA: Diagnosis not present

## 2023-11-08 DIAGNOSIS — G2581 Restless legs syndrome: Secondary | ICD-10-CM | POA: Diagnosis not present

## 2023-11-08 DIAGNOSIS — R52 Pain, unspecified: Secondary | ICD-10-CM | POA: Diagnosis not present

## 2023-11-08 DIAGNOSIS — I69398 Other sequelae of cerebral infarction: Secondary | ICD-10-CM | POA: Diagnosis not present

## 2023-11-08 DIAGNOSIS — J8410 Pulmonary fibrosis, unspecified: Secondary | ICD-10-CM | POA: Diagnosis not present

## 2023-11-12 DIAGNOSIS — M6281 Muscle weakness (generalized): Secondary | ICD-10-CM | POA: Diagnosis not present

## 2023-11-12 DIAGNOSIS — J432 Centrilobular emphysema: Secondary | ICD-10-CM | POA: Diagnosis not present

## 2023-11-12 DIAGNOSIS — I69398 Other sequelae of cerebral infarction: Secondary | ICD-10-CM | POA: Diagnosis not present

## 2023-11-13 DIAGNOSIS — I69398 Other sequelae of cerebral infarction: Secondary | ICD-10-CM | POA: Diagnosis not present

## 2023-11-13 DIAGNOSIS — M6281 Muscle weakness (generalized): Secondary | ICD-10-CM | POA: Diagnosis not present

## 2023-11-13 DIAGNOSIS — J432 Centrilobular emphysema: Secondary | ICD-10-CM | POA: Diagnosis not present

## 2023-11-14 DIAGNOSIS — G2581 Restless legs syndrome: Secondary | ICD-10-CM | POA: Diagnosis not present

## 2023-11-14 DIAGNOSIS — E785 Hyperlipidemia, unspecified: Secondary | ICD-10-CM | POA: Diagnosis not present

## 2023-11-14 DIAGNOSIS — F418 Other specified anxiety disorders: Secondary | ICD-10-CM | POA: Diagnosis not present

## 2023-11-14 DIAGNOSIS — I10 Essential (primary) hypertension: Secondary | ICD-10-CM | POA: Diagnosis not present

## 2023-11-15 DIAGNOSIS — M6281 Muscle weakness (generalized): Secondary | ICD-10-CM | POA: Diagnosis not present

## 2023-11-15 DIAGNOSIS — J432 Centrilobular emphysema: Secondary | ICD-10-CM | POA: Diagnosis not present

## 2023-11-15 DIAGNOSIS — I69398 Other sequelae of cerebral infarction: Secondary | ICD-10-CM | POA: Diagnosis not present

## 2023-11-16 DIAGNOSIS — M6281 Muscle weakness (generalized): Secondary | ICD-10-CM | POA: Diagnosis not present

## 2023-11-16 DIAGNOSIS — J432 Centrilobular emphysema: Secondary | ICD-10-CM | POA: Diagnosis not present

## 2023-11-16 DIAGNOSIS — I69398 Other sequelae of cerebral infarction: Secondary | ICD-10-CM | POA: Diagnosis not present

## 2023-11-18 DIAGNOSIS — I69398 Other sequelae of cerebral infarction: Secondary | ICD-10-CM | POA: Diagnosis not present

## 2023-11-18 DIAGNOSIS — M6281 Muscle weakness (generalized): Secondary | ICD-10-CM | POA: Diagnosis not present

## 2023-11-18 DIAGNOSIS — J432 Centrilobular emphysema: Secondary | ICD-10-CM | POA: Diagnosis not present

## 2023-11-19 DIAGNOSIS — M6281 Muscle weakness (generalized): Secondary | ICD-10-CM | POA: Diagnosis not present

## 2023-11-19 DIAGNOSIS — J432 Centrilobular emphysema: Secondary | ICD-10-CM | POA: Diagnosis not present

## 2023-11-19 DIAGNOSIS — I69398 Other sequelae of cerebral infarction: Secondary | ICD-10-CM | POA: Diagnosis not present

## 2023-11-20 DIAGNOSIS — M6281 Muscle weakness (generalized): Secondary | ICD-10-CM | POA: Diagnosis not present

## 2023-11-20 DIAGNOSIS — J432 Centrilobular emphysema: Secondary | ICD-10-CM | POA: Diagnosis not present

## 2023-11-20 DIAGNOSIS — I69398 Other sequelae of cerebral infarction: Secondary | ICD-10-CM | POA: Diagnosis not present

## 2023-11-21 ENCOUNTER — Ambulatory Visit: Payer: Medicare Other | Admitting: Gastroenterology

## 2023-11-21 DIAGNOSIS — F32A Depression, unspecified: Secondary | ICD-10-CM | POA: Diagnosis not present

## 2023-11-21 DIAGNOSIS — M6281 Muscle weakness (generalized): Secondary | ICD-10-CM | POA: Diagnosis not present

## 2023-11-21 DIAGNOSIS — J432 Centrilobular emphysema: Secondary | ICD-10-CM | POA: Diagnosis not present

## 2023-11-21 DIAGNOSIS — F419 Anxiety disorder, unspecified: Secondary | ICD-10-CM | POA: Diagnosis not present

## 2023-11-21 DIAGNOSIS — I69398 Other sequelae of cerebral infarction: Secondary | ICD-10-CM | POA: Diagnosis not present

## 2023-11-25 DIAGNOSIS — J432 Centrilobular emphysema: Secondary | ICD-10-CM | POA: Diagnosis not present

## 2023-11-25 DIAGNOSIS — M6281 Muscle weakness (generalized): Secondary | ICD-10-CM | POA: Diagnosis not present

## 2023-11-25 DIAGNOSIS — I69398 Other sequelae of cerebral infarction: Secondary | ICD-10-CM | POA: Diagnosis not present

## 2023-11-27 DIAGNOSIS — M6281 Muscle weakness (generalized): Secondary | ICD-10-CM | POA: Diagnosis not present

## 2023-11-27 DIAGNOSIS — J432 Centrilobular emphysema: Secondary | ICD-10-CM | POA: Diagnosis not present

## 2023-11-27 DIAGNOSIS — I69398 Other sequelae of cerebral infarction: Secondary | ICD-10-CM | POA: Diagnosis not present

## 2023-11-29 DIAGNOSIS — J432 Centrilobular emphysema: Secondary | ICD-10-CM | POA: Diagnosis not present

## 2023-11-29 DIAGNOSIS — I69398 Other sequelae of cerebral infarction: Secondary | ICD-10-CM | POA: Diagnosis not present

## 2023-11-29 DIAGNOSIS — M6281 Muscle weakness (generalized): Secondary | ICD-10-CM | POA: Diagnosis not present

## 2023-11-30 DIAGNOSIS — M6281 Muscle weakness (generalized): Secondary | ICD-10-CM | POA: Diagnosis not present

## 2023-11-30 DIAGNOSIS — J432 Centrilobular emphysema: Secondary | ICD-10-CM | POA: Diagnosis not present

## 2023-11-30 DIAGNOSIS — I69398 Other sequelae of cerebral infarction: Secondary | ICD-10-CM | POA: Diagnosis not present

## 2023-12-02 DIAGNOSIS — J432 Centrilobular emphysema: Secondary | ICD-10-CM | POA: Diagnosis not present

## 2023-12-02 DIAGNOSIS — I69398 Other sequelae of cerebral infarction: Secondary | ICD-10-CM | POA: Diagnosis not present

## 2023-12-02 DIAGNOSIS — M6281 Muscle weakness (generalized): Secondary | ICD-10-CM | POA: Diagnosis not present

## 2023-12-03 DIAGNOSIS — M6281 Muscle weakness (generalized): Secondary | ICD-10-CM | POA: Diagnosis not present

## 2023-12-03 DIAGNOSIS — I69398 Other sequelae of cerebral infarction: Secondary | ICD-10-CM | POA: Diagnosis not present

## 2023-12-03 DIAGNOSIS — J432 Centrilobular emphysema: Secondary | ICD-10-CM | POA: Diagnosis not present

## 2023-12-04 DIAGNOSIS — M6281 Muscle weakness (generalized): Secondary | ICD-10-CM | POA: Diagnosis not present

## 2023-12-04 DIAGNOSIS — J432 Centrilobular emphysema: Secondary | ICD-10-CM | POA: Diagnosis not present

## 2023-12-04 DIAGNOSIS — I69398 Other sequelae of cerebral infarction: Secondary | ICD-10-CM | POA: Diagnosis not present

## 2023-12-05 DIAGNOSIS — F32A Depression, unspecified: Secondary | ICD-10-CM | POA: Diagnosis not present

## 2023-12-05 DIAGNOSIS — B351 Tinea unguium: Secondary | ICD-10-CM | POA: Diagnosis not present

## 2023-12-05 DIAGNOSIS — F419 Anxiety disorder, unspecified: Secondary | ICD-10-CM | POA: Diagnosis not present

## 2023-12-05 DIAGNOSIS — I7091 Generalized atherosclerosis: Secondary | ICD-10-CM | POA: Diagnosis not present

## 2023-12-11 DIAGNOSIS — M6281 Muscle weakness (generalized): Secondary | ICD-10-CM | POA: Diagnosis not present

## 2023-12-11 DIAGNOSIS — R2689 Other abnormalities of gait and mobility: Secondary | ICD-10-CM | POA: Diagnosis not present

## 2023-12-13 DIAGNOSIS — R2689 Other abnormalities of gait and mobility: Secondary | ICD-10-CM | POA: Diagnosis not present

## 2023-12-13 DIAGNOSIS — M6281 Muscle weakness (generalized): Secondary | ICD-10-CM | POA: Diagnosis not present

## 2023-12-14 DIAGNOSIS — R2689 Other abnormalities of gait and mobility: Secondary | ICD-10-CM | POA: Diagnosis not present

## 2023-12-14 DIAGNOSIS — M6281 Muscle weakness (generalized): Secondary | ICD-10-CM | POA: Diagnosis not present

## 2023-12-17 DIAGNOSIS — M6281 Muscle weakness (generalized): Secondary | ICD-10-CM | POA: Diagnosis not present

## 2023-12-17 DIAGNOSIS — R2689 Other abnormalities of gait and mobility: Secondary | ICD-10-CM | POA: Diagnosis not present

## 2023-12-18 DIAGNOSIS — R2689 Other abnormalities of gait and mobility: Secondary | ICD-10-CM | POA: Diagnosis not present

## 2023-12-18 DIAGNOSIS — M6281 Muscle weakness (generalized): Secondary | ICD-10-CM | POA: Diagnosis not present

## 2023-12-19 DIAGNOSIS — K5909 Other constipation: Secondary | ICD-10-CM | POA: Diagnosis not present

## 2023-12-19 DIAGNOSIS — L409 Psoriasis, unspecified: Secondary | ICD-10-CM | POA: Diagnosis not present

## 2023-12-19 DIAGNOSIS — R2689 Other abnormalities of gait and mobility: Secondary | ICD-10-CM | POA: Diagnosis not present

## 2023-12-19 DIAGNOSIS — M6281 Muscle weakness (generalized): Secondary | ICD-10-CM | POA: Diagnosis not present

## 2023-12-20 DIAGNOSIS — M6281 Muscle weakness (generalized): Secondary | ICD-10-CM | POA: Diagnosis not present

## 2023-12-20 DIAGNOSIS — R2689 Other abnormalities of gait and mobility: Secondary | ICD-10-CM | POA: Diagnosis not present

## 2023-12-21 DIAGNOSIS — M6281 Muscle weakness (generalized): Secondary | ICD-10-CM | POA: Diagnosis not present

## 2023-12-21 DIAGNOSIS — R2689 Other abnormalities of gait and mobility: Secondary | ICD-10-CM | POA: Diagnosis not present

## 2023-12-24 DIAGNOSIS — M6281 Muscle weakness (generalized): Secondary | ICD-10-CM | POA: Diagnosis not present

## 2023-12-24 DIAGNOSIS — R2689 Other abnormalities of gait and mobility: Secondary | ICD-10-CM | POA: Diagnosis not present

## 2023-12-26 DIAGNOSIS — M6281 Muscle weakness (generalized): Secondary | ICD-10-CM | POA: Diagnosis not present

## 2023-12-26 DIAGNOSIS — R2689 Other abnormalities of gait and mobility: Secondary | ICD-10-CM | POA: Diagnosis not present

## 2024-01-02 DIAGNOSIS — F32A Depression, unspecified: Secondary | ICD-10-CM | POA: Diagnosis not present

## 2024-01-02 DIAGNOSIS — F419 Anxiety disorder, unspecified: Secondary | ICD-10-CM | POA: Diagnosis not present

## 2024-01-21 DIAGNOSIS — I10 Essential (primary) hypertension: Secondary | ICD-10-CM | POA: Diagnosis not present

## 2024-01-21 DIAGNOSIS — M79601 Pain in right arm: Secondary | ICD-10-CM | POA: Diagnosis not present

## 2024-01-23 ENCOUNTER — Ambulatory Visit: Admitting: Psychiatry

## 2024-01-23 ENCOUNTER — Encounter: Payer: Self-pay | Admitting: Psychiatry

## 2024-01-23 DIAGNOSIS — Z8673 Personal history of transient ischemic attack (TIA), and cerebral infarction without residual deficits: Secondary | ICD-10-CM | POA: Diagnosis not present

## 2024-01-23 DIAGNOSIS — G4721 Circadian rhythm sleep disorder, delayed sleep phase type: Secondary | ICD-10-CM | POA: Diagnosis not present

## 2024-01-23 DIAGNOSIS — F411 Generalized anxiety disorder: Secondary | ICD-10-CM

## 2024-01-23 DIAGNOSIS — F0150 Vascular dementia without behavioral disturbance: Secondary | ICD-10-CM

## 2024-01-23 DIAGNOSIS — F331 Major depressive disorder, recurrent, moderate: Secondary | ICD-10-CM | POA: Diagnosis not present

## 2024-01-23 NOTE — Progress Notes (Signed)
 [name] [id number] 09/03/1946 78 y.o.     Subjective:   Patient ID:  [name] is an 78 y.o. (DOB 15-Apr-1946) female.  Chief Complaint:  Chief Complaint  Patient presents with   Follow-up   Depression   Anxiety   Stress    [name] presents to the office today for follow-up of depression and anxiety and MCI.    at visit January 09, 2019.  She was having problems with restless legs and ropinirole  was increased to 3 mg every afternoon.  There were no other med changes.  visit Feb 05, 2019.  She was having mixture of depression and anxiety and chronic insomnia with delayed sleep phase.  For the reasons mentioned at the last visit she was started on risperidone  0.25 mg nightly.  She was maintained on duloxetine  90 mg, clonazepam  1 mg nightly, lamotrigine  150 twice daily, and ropinirole  2 mg 1-1/2 tablets nightly for restless legs.  visit September 2020.  She had stopped the risperidone  because she only took it a few days.  She felt nausea and spaced out on it.  No other meds were changed.  Last visit December 2020.  The following was noted: Doing very poorly.  Not getting dressed and not out of the house for 5 days.  Hard to walk with pain.  Falling apart overnight.  Feet hurt at night.  Worries over poor circulation to toes.  Placed on blood thinners.  Continued health problems with COPD.  Energy is low and hard time walking.  Arthritis in back.  House on the market and that's stressful.  Sx largely unchanged but "it's circumstances".  Planning to move to [facility] but ambivalent.  Brother in law lives next door and wants her to stay here.  Also stressed over politics and social things.   Had period of crippling nausea and cut all meds back in 1/2.  Thinks it's mental issue.  Nausea is some better with dose reduction but is more depressed.  Plan: Use pill box to improve compliance. DT nausea  split meds but increase duloxetine  30 mg BID and lamotrigine  75 mg BID. She's been on  lower dose DT nausea but is more depressed.  January 02, 2020 appointment, the following is noted: She got septic with a UTI and then was admitted [facility]. Home now.  Getting stronger.  Having to take nausea meds. Also diarrhea.  Would like to be on higher dose of meds but can't tolerate it.  Still on some prednisone .  Pending cardiology with CHF.  Also pulmonology. Sleep is pretty good with meds.   No meds changed.  04/26/20 appt with the following noted:  Missed a lot of med DT nausea.  Back on duloxetine  60 and lamotrigine  75 mg for a week.  Was more depressed and dizzy off the meds. Nausea comes and goes in waves but could be any time of day but maybe worse in the evening. Eats poorly usually.  If feels bad doesn't want to cook.   Pepcid  helps nausea.  Zofran  doesn't help. Lately more trouble falling asleep.  Plan: Rec trial mirtazapine  30 mg HS to help with depression and potentially nausea hopefully.  Nausea is interfering with depression treatment.  08/04/20 appt with following noted: Took mirtazapine  30 mg for a week and felt like a zombie with jitteriness outside.  Didn't help nausea much.  Noticing a pattern with nausea related to constipation and bowel problems.  Not ready to take  lamotrigine  bc it's hard to swallow but felt emotionally better when on it.  Wonders if bipolar bc jumps from topic to topic and talks a lot. Wants to stay on clonazepam  to help her sleep.  Aware of memory concerns. Not taking lorazepam  but not sure why she isn't.  Thinks it doesn't work as well as clonazepam .  Says she's taking clonazepam  at night now. Plan;  Restart lamotrigine  at 25 mg and use the small tablets to try to help mood and make it easier to swallow.  Purpose to help mood.  She felt it helped. Will leave duloxetine  at 30 low dose. DC mirtazapine  DT SE  10/11/2020 appointment with the following noted:  Started and stopped lamotrigine  bc not sticking with schedule so just up to  lamotrigine  50 mg daily. RLS requires ropinirole .  Sometimes forgets it and has to keep moving.    Periods of being horrible condition with depression but not suicidal.  Hasnt' been that bad lately.  Still grieving [name] and her sister.  Taking a long time.  Watch too much TV.  Sister [name] was her best friend.   Sometimes feels her presence. No energy to move back to [place] but son encourages it.  Procrastinates it bc moving is overwhelming.  Eating OK.  Enjoyed seeing son and gkids.   Nausea better but comes and goes. Plan: Continue lamotrigine  increase to 100 mg as recommended and use the small tablets to try to help mood and make it easier to swallow.  Purpose to help mood.  She felt it helped.  01/03/2021 appointment with following noted: Up some times and down some times.  Manages.  Kind of average. Not as depressed as in the past.  Still getting dizzy easily.  Sleep hours getting worse instead of bettter.  Once of twice weekly stays up all night and then naps.  Doesn't work out well for her body. Still tends to nausea but variable regardless of what she takes.  Most days consistent with lamotrigine .  Easier to swallow the small lamotrigine  25 vs 100 mg tablets. Don't feel the same since the Sepsis. Going to get driver's license renewed tomorrow. RLS managed with ropinirole .   Sleep pretty good, except irregular hours.Pt reports that mood is Depressed worse at night and describes anxiety as worse and moderate in the same dose for depression but comes and goes.  She wonders if she might be bipolar like her sister.She is also having problems with irritability. Anxiety symptoms include: Excessive Worry, Panic Symptoms,. Pt reports sleep is more regular and consistent with melatonin 10 mg nightly.. Pt reports that appetite is decreased. Pt reports that energy is poor and loss of interest or pleasure in usual activities, poor motivation and withdrawn from usual activities. Concentration is scattered.   Suicidal thoughts:  denied by patient. B In Law next door.   Increased ropinirole  helped RLS. Plan: No med changes  04/12/2021 appointment with the following noted: Had to stop driving bc of dizzy.  Eyes flutter.  Balance not always good.  Back pain. Fighting depression.  Fight with [place] over her moving to [place] and doesn't want to help. Still in [place].  Was hoping to move back to [place]. Lonely here and would like support of family. Otherwise feels better than last year.  Needs son to help her move there and doesn't understand why. Not profoundly depressed but is down.   Lately increased clonazepam  to 0.5 mg just at night for anxiety and helps her sleep better  and have less anxious thoughts.   Not great sleep.  Eats frozen foods. B in law helps her. Wants to increase duloxetine  to 60 mg daily but split dose bc of nausea easily. Plan: Wants to increase duloxetine  to 60 mg daily but split dose bc of nausea easily.  06/16/2021 appt noted: Increased duloxetine  to 60 mg daily and thinks it helped a little better.  But depression worse with family stress going on.  Son gets mad at her and yells.  Keeping GS from her and this is upsetting.   Can talk with him over the phone.  D-in-law keeping GS from her and it's wrong.  Son blames her but that's not true.   Doesn't think she's strong enough to pack up and move to [place].  Chronic dizziness and risk of falling.  Shuffles too much.   She does get online socially.   Pays attention to the news.   Satisfied with meds. Drinks diet Pepsi and coffee up until dinner. Plan: no med changes  11/03/21 appt noted: Hard to walk and probably needs TKR but no one to take care of her locally. Dropped duloxetine  to 30 mg daily and lamotrigine  to 25 mg 2 daily. Still on ropinirole  2 mg PM,  clonazepam  0.5 mg BID  No SE Anxiety cycles but lately it has calmed down.   Son visits from [place] every 6 weeks which helps her a lot.  Occ brings 78 yo GS. Other 2 GD are  married. Was very close to her sister who has been dead 4 years and still grieving.  Her H still helps her with chores. H passed 3 years ago. Falls asleep on couch and sometimes not in bed.  Pattern irregular chronically. Neck pain. RLS managed if taken early enough.  01/17/22 appt noted: Son comes to check on her every 6 weeks.   Recognizes missing meds bc hates swallowing anything.  Also forgets things.   Chronic insomnia and disrupted cycle since lost her H and then her longterm hosp with illness. Can't drive DT eyesight. B in law will driver her places and still helps her. Ongoing depression.  Not sure how much longer she can stay at home and care for herself.  Weaker and balance not great.  Simple things like folding clothes is harder.   Chronic nausea. Overall satisfied with meds when takes them properly and doesn't really want to change.  Fear of fire.  08/29/22 appt noted: About the same.  Hard time swallowing meds. BC chronic nausea.  Stomach very sensitive and it has been since a little girl.   Kind of housebound.   Need something for depression and without antideprssants it is worse.  Depression is not severe all day long.  Worse at night.  Watches TV constantly to take her mind off troublesome thoughts.   Really upset over politics and her son has become Republican and that makes her feel terrible.  He swears at her and tells her she's a bad mother.   Taking duloxetine  30 mg usually HS.  No lamotrigine .  Still on clonazepam  1 mg HS, ropinirole  1 mg PM. Doesn't think she could use liquid antidepressants.   She doesn't have SE with clonazepam  1 mg HS.  Not over sedated with it or balance any worse.   Not so angry now but has been at times.    12/05/22 appt noted: Been to ER twice this month for the same thing.  Lost strength suddenly and then body got hot.  Passed out.  Strange emotional feeling of being in pain but not real pain.  Sense of doom lasting 45 mins.  No trigger for the  first episode but after argument with son the second time.  No history of panic. Really can't go places bc she is weak.   Missing some meds DT nausea.  Leading to more depression. Son comes monthly for 4 days.  Sometimes it is good and other times not.  Can't live with son bc of his wife's problems. Working on getting caregiver.  02/01/23 appt noted: Never able to get Saphris  from pharmacy.  Was told it was OTC. Meds: duloxetine  30 mg daily and ropinirole  1 mg PM, no lamotrigine  as far as she knows. CC trouble sleeping with not enough hours, irregular schedule.  Causing her to feel weaker and trouble walking and focus problems. Still anxiety and stress.  Need some rest.  So sleep depreived it's ridiculous.  01/23/24 appt noted:  with neighbor  [name] Missed appts since here. Staying at [facility].  For a few months.    Brought ppwk. See med list. Med ropinirole  2 PM, clonazepam  0.5 mg HS, duloxetine  60,  She doesn't like the place and wants to go home but knows she needs to be able to walk to do so.  Recognizes memory px. Get extremely depressed there.  Screamer across the hall.   With depression prefers to stay in the room.   Falls asleep in her WC and gets neck pain.  Trouble sleeping at night.   Another stroke with R side weakness in November 2024.[name]   Good appetite.  Swallowing ok.   RLS managed with ropinirole .  No SE.  Vomiting is better.    Her sister had bipolar disorder.  Past Psychiatric Medication Trials: Lithium 300 insomnia, Depakote, olanzapine  2.5,  lamotrigine  300, ropinirole  as high as 2 mg 3 times daily for restless legs,  Aricept poorly tolerated,  Abilify side effects,  Fluoxetine history poop out, sertraline, paroxetine, Lexapro, duloxetine  90,  Mirtazapine  30 zombie Donepezil SE clonazepam  Long psychiatric history.  Sister bipolar. I have never prescribed lorazepam  nor Xanax  Review of Systems:  Review of Systems   Constitutional:  Positive for fatigue.  Eyes:  Positive for visual disturbance.  Cardiovascular:  Negative for palpitations.       Claudication   Gastrointestinal:  Positive for nausea. Negative for abdominal pain and vomiting.  Musculoskeletal:  Positive for back pain, gait problem and neck pain.       Non-radiating  Neurological:  Positive for dizziness and weakness.  Psychiatric/Behavioral:  Positive for confusion, decreased concentration, dysphoric mood and sleep disturbance. Negative for agitation, behavioral problems, hallucinations, self-injury and suicidal ideas. The patient is nervous/anxious. The patient is not hyperactive.    Multiple strokes.  Medications: I have reviewed the patient's current medications.  Current Outpatient Medications  Medication Sig Dispense Refill   acetaminophen  (TYLENOL ) 325 MG tablet Take 2 tablets (650 mg total) by mouth every 6 (six) hours as needed for headache or mild pain (pain score 1-3).     albuterol  (VENTOLIN  HFA) 108 (90 Base) MCG/ACT inhaler Inhale 1-2 puffs into the lungs every 6 (six) hours as needed for wheezing or shortness of breath. 18 g 2   amLODipine  (NORVASC ) 5 MG tablet Take 1 tablet (5 mg total) by mouth daily. 90 tablet 3   aspirin  EC 81 MG tablet Take 81 mg by mouth daily. Swallow whole.  clonazePAM  (KLONOPIN ) 0.5 MG tablet Take 0.5 mg by mouth daily.     clopidogrel  (PLAVIX ) 75 MG tablet Take 1 tablet (75 mg total) by mouth daily with breakfast.     DULoxetine  (CYMBALTA ) 30 MG capsule Take 1 capsule (30 mg total) by mouth daily. (Patient taking differently: Take 60 mg by mouth daily.) 90 capsule 0   ezetimibe  (ZETIA ) 10 MG tablet Take 1 tablet (10 mg total) by mouth daily.     linaclotide  (LINZESS ) 290 MCG CAPS capsule Take 1 capsule (290 mcg total) by mouth daily before breakfast.     losartan  (COZAAR ) 25 MG tablet Take 1 tablet (25 mg total) by mouth daily. 90 tablet 3   polyethylene glycol (MIRALAX  / GLYCOLAX ) 17 g  packet Take 34 g by mouth daily. 14 each 0   PRESCRIPTION MEDICATION Take 1 capsule by mouth daily. Prokinetic. Unknown name and strength.     rOPINIRole  (REQUIP ) 2 MG tablet TAKE 1 TABLET(2 MG) BY MOUTH AT BEDTIME (Patient taking differently: Take 2 mg by mouth at bedtime.) 60 tablet 0   Sennosides 25 MG TABS Take by mouth.     simethicone  (MYLICON) 80 MG chewable tablet Chew 1 tablet (80 mg total) by mouth 4 (four) times daily. 30 tablet 0   traMADol  (ULTRAM ) 50 MG tablet Take 1 tablet (50 mg total) by mouth every 6 (six) hours as needed for severe pain (pain score 7-10). 15 tablet 0   No current facility-administered medications for this visit.    Medication Side Effects: None unless nausea  Allergies:  Allergies  Allergen Reactions   Lipitor [Atorvastatin ] Other (See Comments)    Self reported myalgias   Pravastatin Other (See Comments)    myalgias   Rosuvastatin  Other (See Comments)    Self reported myalgias    Past Medical History:  Diagnosis Date   AAA (abdominal aortic aneurysm) (HCC)    3.1 cm 07/08/18, 3 year follow-up recommended; possible 3 cm AAA by aortogram 09/13/18   Anemia    PMH   Appendicitis with abscess    07/08/18, s/p perc drain; resolved 07/30/18 by CT   Arthritis    Cerebrovascular disease    intra and extracranial vascular dx per MRI 4/11, neurology rec strict CVRF control   Colonic inertia    Constipation    chronic;severe   Depression    Duodenitis    Gastritis    GERD (gastroesophageal reflux disease)    Hypertension    Hypothyroid 01/16/2014   Meningioma (HCC) 10/21/2013   PAD (peripheral artery disease) (HCC)    Peripheral arterial disease (HCC)    Psoriasis    sees derm   Wears dentures    Wears glasses     Family History  Problem Relation Age of Onset   Breast cancer Mother        metastisis to bones   Cancer Sister 7       spinal   Alcohol abuse Brother    Heart disease Brother    Lung cancer Brother        smoked    Diabetes Son    Heart disease Maternal Aunt    Heart disease Other        grandfather    Colon cancer Neg Hx    Esophageal cancer Neg Hx    Stomach cancer Neg Hx     Social History   Socioeconomic History   Marital status: Widowed    Spouse name: Not on file  Number of children: 1   Years of education: Not on file   Highest education level: Not on file  Occupational History   Occupation: retired  Tobacco Use   Smoking status: Every Day    Current packs/day: 0.00    Average packs/day: 0.5 packs/day for 60.0 years (30.0 ttl pk-yrs)    Types: Cigarettes    Start date: 11/10/1959    Last attempt to quit: 11/10/2019    Years since quitting: 4.2   Smokeless tobacco: Never  Vaping Use   Vaping status: Former  Substance and Sexual Activity   Alcohol use: Yes    Alcohol/week: 0.0 standard drinks of alcohol    Comment: yes on occassion   Drug use: No   Sexual activity: Not Currently  Other Topics Concern   Not on file  Social History Narrative   Brother in law Mr Mylene Arts   One son in [place].    Social Drivers of Corporate investment banker Strain: Low Risk  (03/23/2021)   Overall Financial Resource Strain (CARDIA)    Difficulty of Paying Living Expenses: Not hard at all  Food Insecurity: No Food Insecurity (07/16/2023)   Hunger Vital Sign    Worried About Running Out of Food in the Last Year: Never true    Ran Out of Food in the Last Year: Never true  Transportation Needs: No Transportation Needs (07/16/2023)   PRAPARE - Administrator, Civil Service (Medical): No    Lack of Transportation (Non-Medical): No  Physical Activity: Inactive (06/13/2023)   Exercise Vital Sign    Days of Exercise per Week: 0 days    Minutes of Exercise per Session: 0 min  Stress: Stress Concern Present (06/13/2023)   [employer] of Occupational Health - Occupational Stress Questionnaire    Feeling of Stress : Very much  Social Connections: Socially Isolated (06/13/2023)    Social Connection and Isolation Panel [NHANES]    Frequency of Communication with Friends and Family: Three times a week    Frequency of Social Gatherings with Friends and Family: Twice a week    Attends Religious Services: Never    Database administrator or Organizations: No    Attends Banker Meetings: Never    Marital Status: Widowed  Intimate Partner Violence: Not At Risk (07/16/2023)   Humiliation, Afraid, Rape, and Kick questionnaire    Fear of Current or Ex-Partner: No    Emotionally Abused: No    Physically Abused: No    Sexually Abused: No    Past Medical History, Surgical history, Social history, and Family history were reviewed and updated as appropriate.   Please see review of systems for further details on the patient's review from today.   Objective:   Physical Exam:  There were no vitals taken for this visit.  Physical Exam Neurological:     Mental Status: She is alert.     Cranial Nerves: No dysarthria.     Motor: Weakness present.     Gait: Gait abnormal.  Psychiatric:        Attention and Perception: She is inattentive. She does not perceive auditory hallucinations.        Mood and Affect: Mood is anxious and depressed.        Speech: Speech normal. Speech is not slurred.        Behavior: Behavior is not slowed or aggressive. Behavior is cooperative.        Thought Content: Thought content normal.  Thought content is not paranoid or delusional. Thought content does not include homicidal or suicidal ideation. Thought content does not include suicidal plan.        Cognition and Memory: Memory is impaired. She exhibits impaired recent memory.        Judgment: Judgment is not impulsive or inappropriate.     Comments:  Insight and judgment are fair Less talkative and lower volume.   Spring. Not day of week.  May, doesn't know year. President Trump.      Lab Review:     Component Value Date/Time   NA 134 (L) 07/22/2023 0645   NA 135 01/02/2020  1602   K 3.8 07/22/2023 0645   CL 101 07/22/2023 0645   CO2 26 07/22/2023 0645   GLUCOSE 109 (H) 07/22/2023 0645   BUN 15 07/22/2023 0645   BUN 17 01/02/2020 1602   CREATININE 0.83 07/22/2023 0645   CREATININE 0.77 07/28/2015 1805   CALCIUM  8.9 07/22/2023 0645   PROT 5.9 (L) 07/19/2023 1051   PROT 7.9 01/02/2020 1602   ALBUMIN  2.6 (L) 07/19/2023 1051   ALBUMIN  4.2 01/02/2020 1602   AST 15 07/19/2023 1051   ALT 12 07/19/2023 1051   ALKPHOS 58 07/19/2023 1051   BILITOT 0.6 07/19/2023 1051   BILITOT 0.2 01/02/2020 1602   GFRNONAA >60 07/22/2023 0645   GFRAA 58 (L) 01/02/2020 1602       Component Value Date/Time   WBC 7.2 07/19/2023 0325   RBC 3.55 (L) 07/19/2023 0325   HGB 9.9 (L) 07/19/2023 0325   HGB 11.9 01/02/2020 1602   HCT 31.8 (L) 07/19/2023 0325   HCT 36.4 01/02/2020 1602   PLT 217 07/19/2023 0325   PLT 370 01/02/2020 1602   MCV 89.6 07/19/2023 0325   MCV 90 01/02/2020 1602   MCH 27.9 07/19/2023 0325   MCHC 31.1 07/19/2023 0325   RDW 14.5 07/19/2023 0325   RDW 14.0 01/02/2020 1602   LYMPHSABS 2.0 03/03/2023 0336   LYMPHSABS 2.7 01/02/2020 1602   MONOABS 0.7 03/03/2023 0336   EOSABS 0.3 03/03/2023 0336   EOSABS 0.1 01/02/2020 1602   BASOSABS 0.0 03/03/2023 0336   BASOSABS 0.0 01/02/2020 1602    No results found for: "POCLITH", "LITHIUM"   No results found for: "PHENYTOIN", "PHENOBARB", "VALPROATE", "CBMZ"   .res Assessment: Plan:    Major depressive disorder, recurrent episode, moderate (HCC)  Generalized anxiety disorder  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    45 min appt  Vanda has some chronic depression and chronic irregular sleep patterns which have been resistant to treatment.  Her chronic depression and anxiety .   She is lonely and mood affected by ongoing severe health problems.  Multiple strokes.    Med reconciliation with [facility].  continue duloxetine  60 mg daily .  Good that dose was increased to current level.  Had to increase ropinirole  2 mg for RLS.  This has better controlled her restless legs.  Doing ok right now.  No clear indication for med changes nor obivious changes that might help.    We discussed the short-term risks associated with benzodiazepines including sedation and increased fall risk among others.  Discussed long-term side effect risk including dependence, potential withdrawal symptoms, and the potential eventual dose-related risk of dementia.  But recent studies from 2020 dispute this association between benzodiazepines and dementia risk.  Newer studies in 2020 do not support an association with dementia.  Sleep hygiene and disc her delayed sleep phase which is chronic at this time.    FU 6 mos.  Nori Beat, MD, DFAPA  Please see After Visit Summary for patient specific instructions.  Future Appointments  Date Time Provider Department Center  02/26/2024  1:00 PM [facility]  02/26/2024  1:30 PM [facility]  02/26/2024  2:40 PM Young Hensen, MD [facility]  06/24/2024 11:30 AM [facility] ANNUAL WELLNESS VISIT [facility]      No orders of the defined types were placed in this encounter.      -------------------------------

## 2024-01-30 DIAGNOSIS — F419 Anxiety disorder, unspecified: Secondary | ICD-10-CM | POA: Diagnosis not present

## 2024-01-30 DIAGNOSIS — F32A Depression, unspecified: Secondary | ICD-10-CM | POA: Diagnosis not present

## 2024-02-07 DIAGNOSIS — I7091 Generalized atherosclerosis: Secondary | ICD-10-CM | POA: Diagnosis not present

## 2024-02-07 DIAGNOSIS — B351 Tinea unguium: Secondary | ICD-10-CM | POA: Diagnosis not present

## 2024-02-14 DIAGNOSIS — K5909 Other constipation: Secondary | ICD-10-CM | POA: Diagnosis not present

## 2024-02-14 DIAGNOSIS — E785 Hyperlipidemia, unspecified: Secondary | ICD-10-CM | POA: Diagnosis not present

## 2024-02-14 DIAGNOSIS — I739 Peripheral vascular disease, unspecified: Secondary | ICD-10-CM | POA: Diagnosis not present

## 2024-02-14 DIAGNOSIS — I7090 Unspecified atherosclerosis: Secondary | ICD-10-CM | POA: Diagnosis not present

## 2024-02-14 DIAGNOSIS — F319 Bipolar disorder, unspecified: Secondary | ICD-10-CM | POA: Diagnosis not present

## 2024-02-14 DIAGNOSIS — I639 Cerebral infarction, unspecified: Secondary | ICD-10-CM | POA: Diagnosis not present

## 2024-02-14 DIAGNOSIS — F418 Other specified anxiety disorders: Secondary | ICD-10-CM | POA: Diagnosis not present

## 2024-02-20 DIAGNOSIS — L409 Psoriasis, unspecified: Secondary | ICD-10-CM | POA: Diagnosis not present

## 2024-02-21 DIAGNOSIS — R2681 Unsteadiness on feet: Secondary | ICD-10-CM | POA: Diagnosis not present

## 2024-02-21 DIAGNOSIS — M6281 Muscle weakness (generalized): Secondary | ICD-10-CM | POA: Diagnosis not present

## 2024-02-21 DIAGNOSIS — I69398 Other sequelae of cerebral infarction: Secondary | ICD-10-CM | POA: Diagnosis not present

## 2024-02-25 DIAGNOSIS — M6281 Muscle weakness (generalized): Secondary | ICD-10-CM | POA: Diagnosis not present

## 2024-02-25 DIAGNOSIS — I69398 Other sequelae of cerebral infarction: Secondary | ICD-10-CM | POA: Diagnosis not present

## 2024-02-25 DIAGNOSIS — R2681 Unsteadiness on feet: Secondary | ICD-10-CM | POA: Diagnosis not present

## 2024-02-26 ENCOUNTER — Encounter (HOSPITAL_COMMUNITY): Payer: Medicare Other

## 2024-02-26 ENCOUNTER — Ambulatory Visit: Payer: Medicare Other | Admitting: Vascular Surgery

## 2024-02-27 DIAGNOSIS — R2681 Unsteadiness on feet: Secondary | ICD-10-CM | POA: Diagnosis not present

## 2024-02-27 DIAGNOSIS — I69398 Other sequelae of cerebral infarction: Secondary | ICD-10-CM | POA: Diagnosis not present

## 2024-02-27 DIAGNOSIS — M6281 Muscle weakness (generalized): Secondary | ICD-10-CM | POA: Diagnosis not present

## 2024-02-28 DIAGNOSIS — R2681 Unsteadiness on feet: Secondary | ICD-10-CM | POA: Diagnosis not present

## 2024-02-28 DIAGNOSIS — M6281 Muscle weakness (generalized): Secondary | ICD-10-CM | POA: Diagnosis not present

## 2024-02-28 DIAGNOSIS — I69398 Other sequelae of cerebral infarction: Secondary | ICD-10-CM | POA: Diagnosis not present

## 2024-03-04 DIAGNOSIS — M6281 Muscle weakness (generalized): Secondary | ICD-10-CM | POA: Diagnosis not present

## 2024-03-04 DIAGNOSIS — R2681 Unsteadiness on feet: Secondary | ICD-10-CM | POA: Diagnosis not present

## 2024-03-04 DIAGNOSIS — I69398 Other sequelae of cerebral infarction: Secondary | ICD-10-CM | POA: Diagnosis not present

## 2024-03-05 DIAGNOSIS — M6281 Muscle weakness (generalized): Secondary | ICD-10-CM | POA: Diagnosis not present

## 2024-03-05 DIAGNOSIS — I69398 Other sequelae of cerebral infarction: Secondary | ICD-10-CM | POA: Diagnosis not present

## 2024-03-05 DIAGNOSIS — F32A Depression, unspecified: Secondary | ICD-10-CM | POA: Diagnosis not present

## 2024-03-05 DIAGNOSIS — F419 Anxiety disorder, unspecified: Secondary | ICD-10-CM | POA: Diagnosis not present

## 2024-03-05 DIAGNOSIS — R2681 Unsteadiness on feet: Secondary | ICD-10-CM | POA: Diagnosis not present

## 2024-03-06 DIAGNOSIS — R2681 Unsteadiness on feet: Secondary | ICD-10-CM | POA: Diagnosis not present

## 2024-03-06 DIAGNOSIS — M6281 Muscle weakness (generalized): Secondary | ICD-10-CM | POA: Diagnosis not present

## 2024-03-06 DIAGNOSIS — I69398 Other sequelae of cerebral infarction: Secondary | ICD-10-CM | POA: Diagnosis not present

## 2024-03-10 DIAGNOSIS — R2681 Unsteadiness on feet: Secondary | ICD-10-CM | POA: Diagnosis not present

## 2024-03-10 DIAGNOSIS — I69398 Other sequelae of cerebral infarction: Secondary | ICD-10-CM | POA: Diagnosis not present

## 2024-03-10 DIAGNOSIS — M6281 Muscle weakness (generalized): Secondary | ICD-10-CM | POA: Diagnosis not present

## 2024-03-11 DIAGNOSIS — I69820 Aphasia following other cerebrovascular disease: Secondary | ICD-10-CM | POA: Diagnosis not present

## 2024-03-11 DIAGNOSIS — M6281 Muscle weakness (generalized): Secondary | ICD-10-CM | POA: Diagnosis not present

## 2024-03-11 DIAGNOSIS — R2681 Unsteadiness on feet: Secondary | ICD-10-CM | POA: Diagnosis not present

## 2024-03-11 DIAGNOSIS — R41841 Cognitive communication deficit: Secondary | ICD-10-CM | POA: Diagnosis not present

## 2024-03-12 DIAGNOSIS — R2681 Unsteadiness on feet: Secondary | ICD-10-CM | POA: Diagnosis not present

## 2024-03-12 DIAGNOSIS — R41841 Cognitive communication deficit: Secondary | ICD-10-CM | POA: Diagnosis not present

## 2024-03-12 DIAGNOSIS — I69820 Aphasia following other cerebrovascular disease: Secondary | ICD-10-CM | POA: Diagnosis not present

## 2024-03-12 DIAGNOSIS — M6281 Muscle weakness (generalized): Secondary | ICD-10-CM | POA: Diagnosis not present

## 2024-03-18 DIAGNOSIS — R41841 Cognitive communication deficit: Secondary | ICD-10-CM | POA: Diagnosis not present

## 2024-03-18 DIAGNOSIS — R2681 Unsteadiness on feet: Secondary | ICD-10-CM | POA: Diagnosis not present

## 2024-03-18 DIAGNOSIS — M6281 Muscle weakness (generalized): Secondary | ICD-10-CM | POA: Diagnosis not present

## 2024-03-18 DIAGNOSIS — I69820 Aphasia following other cerebrovascular disease: Secondary | ICD-10-CM | POA: Diagnosis not present

## 2024-03-20 DIAGNOSIS — M6281 Muscle weakness (generalized): Secondary | ICD-10-CM | POA: Diagnosis not present

## 2024-03-20 DIAGNOSIS — R41841 Cognitive communication deficit: Secondary | ICD-10-CM | POA: Diagnosis not present

## 2024-03-20 DIAGNOSIS — I69820 Aphasia following other cerebrovascular disease: Secondary | ICD-10-CM | POA: Diagnosis not present

## 2024-03-20 DIAGNOSIS — R2681 Unsteadiness on feet: Secondary | ICD-10-CM | POA: Diagnosis not present

## 2024-03-26 DIAGNOSIS — R41841 Cognitive communication deficit: Secondary | ICD-10-CM | POA: Diagnosis not present

## 2024-03-26 DIAGNOSIS — F4323 Adjustment disorder with mixed anxiety and depressed mood: Secondary | ICD-10-CM | POA: Diagnosis not present

## 2024-03-26 DIAGNOSIS — M6281 Muscle weakness (generalized): Secondary | ICD-10-CM | POA: Diagnosis not present

## 2024-03-26 DIAGNOSIS — R2681 Unsteadiness on feet: Secondary | ICD-10-CM | POA: Diagnosis not present

## 2024-03-26 DIAGNOSIS — F419 Anxiety disorder, unspecified: Secondary | ICD-10-CM | POA: Diagnosis not present

## 2024-03-26 DIAGNOSIS — I69820 Aphasia following other cerebrovascular disease: Secondary | ICD-10-CM | POA: Diagnosis not present

## 2024-03-27 DIAGNOSIS — M6281 Muscle weakness (generalized): Secondary | ICD-10-CM | POA: Diagnosis not present

## 2024-03-27 DIAGNOSIS — I69820 Aphasia following other cerebrovascular disease: Secondary | ICD-10-CM | POA: Diagnosis not present

## 2024-03-27 DIAGNOSIS — R2681 Unsteadiness on feet: Secondary | ICD-10-CM | POA: Diagnosis not present

## 2024-03-27 DIAGNOSIS — R41841 Cognitive communication deficit: Secondary | ICD-10-CM | POA: Diagnosis not present

## 2024-03-31 DIAGNOSIS — G2581 Restless legs syndrome: Secondary | ICD-10-CM | POA: Diagnosis not present

## 2024-03-31 DIAGNOSIS — F411 Generalized anxiety disorder: Secondary | ICD-10-CM | POA: Diagnosis not present

## 2024-04-01 DIAGNOSIS — M6281 Muscle weakness (generalized): Secondary | ICD-10-CM | POA: Diagnosis not present

## 2024-04-01 DIAGNOSIS — R2681 Unsteadiness on feet: Secondary | ICD-10-CM | POA: Diagnosis not present

## 2024-04-01 DIAGNOSIS — R41841 Cognitive communication deficit: Secondary | ICD-10-CM | POA: Diagnosis not present

## 2024-04-01 DIAGNOSIS — I69820 Aphasia following other cerebrovascular disease: Secondary | ICD-10-CM | POA: Diagnosis not present

## 2024-04-02 DIAGNOSIS — R2681 Unsteadiness on feet: Secondary | ICD-10-CM | POA: Diagnosis not present

## 2024-04-02 DIAGNOSIS — I69820 Aphasia following other cerebrovascular disease: Secondary | ICD-10-CM | POA: Diagnosis not present

## 2024-04-02 DIAGNOSIS — M6281 Muscle weakness (generalized): Secondary | ICD-10-CM | POA: Diagnosis not present

## 2024-04-02 DIAGNOSIS — R41841 Cognitive communication deficit: Secondary | ICD-10-CM | POA: Diagnosis not present

## 2024-04-03 DIAGNOSIS — I69820 Aphasia following other cerebrovascular disease: Secondary | ICD-10-CM | POA: Diagnosis not present

## 2024-04-03 DIAGNOSIS — R41841 Cognitive communication deficit: Secondary | ICD-10-CM | POA: Diagnosis not present

## 2024-04-03 DIAGNOSIS — R2681 Unsteadiness on feet: Secondary | ICD-10-CM | POA: Diagnosis not present

## 2024-04-03 DIAGNOSIS — M6281 Muscle weakness (generalized): Secondary | ICD-10-CM | POA: Diagnosis not present

## 2024-04-04 DIAGNOSIS — L409 Psoriasis, unspecified: Secondary | ICD-10-CM | POA: Diagnosis not present

## 2024-04-07 DIAGNOSIS — R2681 Unsteadiness on feet: Secondary | ICD-10-CM | POA: Diagnosis not present

## 2024-04-07 DIAGNOSIS — R41841 Cognitive communication deficit: Secondary | ICD-10-CM | POA: Diagnosis not present

## 2024-04-07 DIAGNOSIS — I69820 Aphasia following other cerebrovascular disease: Secondary | ICD-10-CM | POA: Diagnosis not present

## 2024-04-07 DIAGNOSIS — M6281 Muscle weakness (generalized): Secondary | ICD-10-CM | POA: Diagnosis not present

## 2024-04-08 DIAGNOSIS — M6281 Muscle weakness (generalized): Secondary | ICD-10-CM | POA: Diagnosis not present

## 2024-04-08 DIAGNOSIS — R41841 Cognitive communication deficit: Secondary | ICD-10-CM | POA: Diagnosis not present

## 2024-04-08 DIAGNOSIS — R2681 Unsteadiness on feet: Secondary | ICD-10-CM | POA: Diagnosis not present

## 2024-04-08 DIAGNOSIS — I69820 Aphasia following other cerebrovascular disease: Secondary | ICD-10-CM | POA: Diagnosis not present

## 2024-04-09 DIAGNOSIS — R2681 Unsteadiness on feet: Secondary | ICD-10-CM | POA: Diagnosis not present

## 2024-04-09 DIAGNOSIS — R41841 Cognitive communication deficit: Secondary | ICD-10-CM | POA: Diagnosis not present

## 2024-04-09 DIAGNOSIS — M6281 Muscle weakness (generalized): Secondary | ICD-10-CM | POA: Diagnosis not present

## 2024-04-09 DIAGNOSIS — I69820 Aphasia following other cerebrovascular disease: Secondary | ICD-10-CM | POA: Diagnosis not present

## 2024-04-10 DIAGNOSIS — I69820 Aphasia following other cerebrovascular disease: Secondary | ICD-10-CM | POA: Diagnosis not present

## 2024-04-10 DIAGNOSIS — R2681 Unsteadiness on feet: Secondary | ICD-10-CM | POA: Diagnosis not present

## 2024-04-10 DIAGNOSIS — M6281 Muscle weakness (generalized): Secondary | ICD-10-CM | POA: Diagnosis not present

## 2024-04-10 DIAGNOSIS — R41841 Cognitive communication deficit: Secondary | ICD-10-CM | POA: Diagnosis not present

## 2024-04-14 DIAGNOSIS — R41841 Cognitive communication deficit: Secondary | ICD-10-CM | POA: Diagnosis not present

## 2024-04-14 DIAGNOSIS — I69820 Aphasia following other cerebrovascular disease: Secondary | ICD-10-CM | POA: Diagnosis not present

## 2024-04-16 DIAGNOSIS — R41841 Cognitive communication deficit: Secondary | ICD-10-CM | POA: Diagnosis not present

## 2024-04-16 DIAGNOSIS — I69820 Aphasia following other cerebrovascular disease: Secondary | ICD-10-CM | POA: Diagnosis not present

## 2024-04-17 DIAGNOSIS — I69820 Aphasia following other cerebrovascular disease: Secondary | ICD-10-CM | POA: Diagnosis not present

## 2024-04-17 DIAGNOSIS — R41841 Cognitive communication deficit: Secondary | ICD-10-CM | POA: Diagnosis not present

## 2024-04-18 DIAGNOSIS — I69820 Aphasia following other cerebrovascular disease: Secondary | ICD-10-CM | POA: Diagnosis not present

## 2024-04-18 DIAGNOSIS — R41841 Cognitive communication deficit: Secondary | ICD-10-CM | POA: Diagnosis not present

## 2024-04-21 DIAGNOSIS — B351 Tinea unguium: Secondary | ICD-10-CM | POA: Diagnosis not present

## 2024-04-21 DIAGNOSIS — G2581 Restless legs syndrome: Secondary | ICD-10-CM | POA: Diagnosis not present

## 2024-04-21 DIAGNOSIS — I69820 Aphasia following other cerebrovascular disease: Secondary | ICD-10-CM | POA: Diagnosis not present

## 2024-04-21 DIAGNOSIS — I7091 Generalized atherosclerosis: Secondary | ICD-10-CM | POA: Diagnosis not present

## 2024-04-21 DIAGNOSIS — I639 Cerebral infarction, unspecified: Secondary | ICD-10-CM | POA: Diagnosis not present

## 2024-04-21 DIAGNOSIS — R41841 Cognitive communication deficit: Secondary | ICD-10-CM | POA: Diagnosis not present

## 2024-04-21 DIAGNOSIS — I10 Essential (primary) hypertension: Secondary | ICD-10-CM | POA: Diagnosis not present

## 2024-04-22 DIAGNOSIS — I69820 Aphasia following other cerebrovascular disease: Secondary | ICD-10-CM | POA: Diagnosis not present

## 2024-04-22 DIAGNOSIS — R41841 Cognitive communication deficit: Secondary | ICD-10-CM | POA: Diagnosis not present

## 2024-04-23 DIAGNOSIS — R41841 Cognitive communication deficit: Secondary | ICD-10-CM | POA: Diagnosis not present

## 2024-04-23 DIAGNOSIS — I69820 Aphasia following other cerebrovascular disease: Secondary | ICD-10-CM | POA: Diagnosis not present

## 2024-04-29 ENCOUNTER — Ambulatory Visit (HOSPITAL_COMMUNITY)

## 2024-04-29 ENCOUNTER — Ambulatory Visit: Admitting: Vascular Surgery

## 2024-04-30 DIAGNOSIS — F419 Anxiety disorder, unspecified: Secondary | ICD-10-CM | POA: Diagnosis not present

## 2024-04-30 DIAGNOSIS — F32A Depression, unspecified: Secondary | ICD-10-CM | POA: Diagnosis not present

## 2024-05-14 DIAGNOSIS — I10 Essential (primary) hypertension: Secondary | ICD-10-CM | POA: Diagnosis not present

## 2024-05-14 DIAGNOSIS — F418 Other specified anxiety disorders: Secondary | ICD-10-CM | POA: Diagnosis not present

## 2024-05-14 DIAGNOSIS — L409 Psoriasis, unspecified: Secondary | ICD-10-CM | POA: Diagnosis not present

## 2024-05-21 DIAGNOSIS — F419 Anxiety disorder, unspecified: Secondary | ICD-10-CM | POA: Diagnosis not present

## 2024-05-21 DIAGNOSIS — F32A Depression, unspecified: Secondary | ICD-10-CM | POA: Diagnosis not present

## 2024-05-28 DIAGNOSIS — L299 Pruritus, unspecified: Secondary | ICD-10-CM | POA: Diagnosis not present

## 2024-05-28 DIAGNOSIS — L409 Psoriasis, unspecified: Secondary | ICD-10-CM | POA: Diagnosis not present

## 2024-05-28 DIAGNOSIS — F419 Anxiety disorder, unspecified: Secondary | ICD-10-CM | POA: Diagnosis not present

## 2024-05-28 DIAGNOSIS — F32A Depression, unspecified: Secondary | ICD-10-CM | POA: Diagnosis not present

## 2024-06-05 DIAGNOSIS — K5909 Other constipation: Secondary | ICD-10-CM | POA: Diagnosis not present

## 2024-06-05 DIAGNOSIS — F418 Other specified anxiety disorders: Secondary | ICD-10-CM | POA: Diagnosis not present

## 2024-06-05 DIAGNOSIS — I739 Peripheral vascular disease, unspecified: Secondary | ICD-10-CM | POA: Diagnosis not present

## 2024-06-05 DIAGNOSIS — I10 Essential (primary) hypertension: Secondary | ICD-10-CM | POA: Diagnosis not present

## 2024-06-05 DIAGNOSIS — L409 Psoriasis, unspecified: Secondary | ICD-10-CM | POA: Diagnosis not present

## 2024-06-05 DIAGNOSIS — F319 Bipolar disorder, unspecified: Secondary | ICD-10-CM | POA: Diagnosis not present

## 2024-06-05 DIAGNOSIS — I7090 Unspecified atherosclerosis: Secondary | ICD-10-CM | POA: Diagnosis not present

## 2024-06-05 DIAGNOSIS — I639 Cerebral infarction, unspecified: Secondary | ICD-10-CM | POA: Diagnosis not present

## 2024-06-06 DIAGNOSIS — R1312 Dysphagia, oropharyngeal phase: Secondary | ICD-10-CM | POA: Diagnosis not present

## 2024-06-06 DIAGNOSIS — R41841 Cognitive communication deficit: Secondary | ICD-10-CM | POA: Diagnosis not present

## 2024-06-10 DIAGNOSIS — R1312 Dysphagia, oropharyngeal phase: Secondary | ICD-10-CM | POA: Diagnosis not present

## 2024-06-10 DIAGNOSIS — R41841 Cognitive communication deficit: Secondary | ICD-10-CM | POA: Diagnosis not present

## 2024-06-11 DIAGNOSIS — R1312 Dysphagia, oropharyngeal phase: Secondary | ICD-10-CM | POA: Diagnosis not present

## 2024-06-11 DIAGNOSIS — I69398 Other sequelae of cerebral infarction: Secondary | ICD-10-CM | POA: Diagnosis not present

## 2024-06-11 DIAGNOSIS — M6281 Muscle weakness (generalized): Secondary | ICD-10-CM | POA: Diagnosis not present

## 2024-06-12 DIAGNOSIS — I69398 Other sequelae of cerebral infarction: Secondary | ICD-10-CM | POA: Diagnosis not present

## 2024-06-12 DIAGNOSIS — M6281 Muscle weakness (generalized): Secondary | ICD-10-CM | POA: Diagnosis not present

## 2024-06-12 DIAGNOSIS — R1312 Dysphagia, oropharyngeal phase: Secondary | ICD-10-CM | POA: Diagnosis not present

## 2024-06-16 DIAGNOSIS — I69398 Other sequelae of cerebral infarction: Secondary | ICD-10-CM | POA: Diagnosis not present

## 2024-06-16 DIAGNOSIS — M6281 Muscle weakness (generalized): Secondary | ICD-10-CM | POA: Diagnosis not present

## 2024-06-16 DIAGNOSIS — R1312 Dysphagia, oropharyngeal phase: Secondary | ICD-10-CM | POA: Diagnosis not present

## 2024-06-17 DIAGNOSIS — R1312 Dysphagia, oropharyngeal phase: Secondary | ICD-10-CM | POA: Diagnosis not present

## 2024-06-17 DIAGNOSIS — M6281 Muscle weakness (generalized): Secondary | ICD-10-CM | POA: Diagnosis not present

## 2024-06-17 DIAGNOSIS — I69398 Other sequelae of cerebral infarction: Secondary | ICD-10-CM | POA: Diagnosis not present

## 2024-06-18 DIAGNOSIS — I69398 Other sequelae of cerebral infarction: Secondary | ICD-10-CM | POA: Diagnosis not present

## 2024-06-18 DIAGNOSIS — F32A Depression, unspecified: Secondary | ICD-10-CM | POA: Diagnosis not present

## 2024-06-18 DIAGNOSIS — R1312 Dysphagia, oropharyngeal phase: Secondary | ICD-10-CM | POA: Diagnosis not present

## 2024-06-18 DIAGNOSIS — M6281 Muscle weakness (generalized): Secondary | ICD-10-CM | POA: Diagnosis not present

## 2024-06-18 DIAGNOSIS — F419 Anxiety disorder, unspecified: Secondary | ICD-10-CM | POA: Diagnosis not present

## 2024-06-19 DIAGNOSIS — L409 Psoriasis, unspecified: Secondary | ICD-10-CM | POA: Diagnosis not present

## 2024-06-19 DIAGNOSIS — J8410 Pulmonary fibrosis, unspecified: Secondary | ICD-10-CM | POA: Diagnosis not present

## 2024-06-24 DIAGNOSIS — R1312 Dysphagia, oropharyngeal phase: Secondary | ICD-10-CM | POA: Diagnosis not present

## 2024-06-24 DIAGNOSIS — M6281 Muscle weakness (generalized): Secondary | ICD-10-CM | POA: Diagnosis not present

## 2024-06-24 DIAGNOSIS — I69398 Other sequelae of cerebral infarction: Secondary | ICD-10-CM | POA: Diagnosis not present

## 2024-06-25 DIAGNOSIS — R1312 Dysphagia, oropharyngeal phase: Secondary | ICD-10-CM | POA: Diagnosis not present

## 2024-06-25 DIAGNOSIS — F32A Depression, unspecified: Secondary | ICD-10-CM | POA: Diagnosis not present

## 2024-06-25 DIAGNOSIS — I69398 Other sequelae of cerebral infarction: Secondary | ICD-10-CM | POA: Diagnosis not present

## 2024-06-25 DIAGNOSIS — F419 Anxiety disorder, unspecified: Secondary | ICD-10-CM | POA: Diagnosis not present

## 2024-06-25 DIAGNOSIS — M6281 Muscle weakness (generalized): Secondary | ICD-10-CM | POA: Diagnosis not present

## 2024-06-27 DIAGNOSIS — I7091 Generalized atherosclerosis: Secondary | ICD-10-CM | POA: Diagnosis not present

## 2024-06-27 DIAGNOSIS — B351 Tinea unguium: Secondary | ICD-10-CM | POA: Diagnosis not present

## 2024-07-02 DIAGNOSIS — F32A Depression, unspecified: Secondary | ICD-10-CM | POA: Diagnosis not present

## 2024-07-02 DIAGNOSIS — F419 Anxiety disorder, unspecified: Secondary | ICD-10-CM | POA: Diagnosis not present

## 2024-07-16 ENCOUNTER — Other Ambulatory Visit: Payer: Self-pay

## 2024-07-16 ENCOUNTER — Observation Stay (HOSPITAL_COMMUNITY)
Admission: EM | Admit: 2024-07-16 | Discharge: 2024-07-22 | Disposition: A | Discharge status: Skilled Nursing Facility | Attending: Internal Medicine | Admitting: Internal Medicine

## 2024-07-16 DIAGNOSIS — K559 Vascular disorder of intestine, unspecified: Secondary | ICD-10-CM | POA: Diagnosis not present

## 2024-07-16 DIAGNOSIS — N183 Chronic kidney disease, stage 3 unspecified: Secondary | ICD-10-CM | POA: Diagnosis present

## 2024-07-16 DIAGNOSIS — K469 Unspecified abdominal hernia without obstruction or gangrene: Secondary | ICD-10-CM | POA: Diagnosis not present

## 2024-07-16 DIAGNOSIS — R140 Abdominal distension (gaseous): Secondary | ICD-10-CM | POA: Diagnosis not present

## 2024-07-16 DIAGNOSIS — I739 Peripheral vascular disease, unspecified: Secondary | ICD-10-CM | POA: Diagnosis present

## 2024-07-16 DIAGNOSIS — K562 Volvulus: Secondary | ICD-10-CM | POA: Insufficient documentation

## 2024-07-16 DIAGNOSIS — N1832 Chronic kidney disease, stage 3b: Secondary | ICD-10-CM | POA: Insufficient documentation

## 2024-07-16 DIAGNOSIS — R933 Abnormal findings on diagnostic imaging of other parts of digestive tract: Secondary | ICD-10-CM | POA: Diagnosis not present

## 2024-07-16 DIAGNOSIS — J449 Chronic obstructive pulmonary disease, unspecified: Secondary | ICD-10-CM | POA: Diagnosis not present

## 2024-07-16 DIAGNOSIS — D125 Benign neoplasm of sigmoid colon: Secondary | ICD-10-CM | POA: Diagnosis not present

## 2024-07-16 DIAGNOSIS — I129 Hypertensive chronic kidney disease with stage 1 through stage 4 chronic kidney disease, or unspecified chronic kidney disease: Secondary | ICD-10-CM | POA: Diagnosis not present

## 2024-07-16 DIAGNOSIS — R110 Nausea: Secondary | ICD-10-CM | POA: Diagnosis not present

## 2024-07-16 DIAGNOSIS — Z79899 Other long term (current) drug therapy: Secondary | ICD-10-CM | POA: Diagnosis not present

## 2024-07-16 DIAGNOSIS — R1084 Generalized abdominal pain: Principal | ICD-10-CM

## 2024-07-16 DIAGNOSIS — I714 Abdominal aortic aneurysm, without rupture, unspecified: Secondary | ICD-10-CM | POA: Insufficient documentation

## 2024-07-16 DIAGNOSIS — I712 Thoracic aortic aneurysm, without rupture, unspecified: Secondary | ICD-10-CM | POA: Diagnosis not present

## 2024-07-16 DIAGNOSIS — F32A Depression, unspecified: Secondary | ICD-10-CM | POA: Diagnosis present

## 2024-07-16 DIAGNOSIS — E039 Hypothyroidism, unspecified: Secondary | ICD-10-CM | POA: Diagnosis not present

## 2024-07-16 DIAGNOSIS — I6782 Cerebral ischemia: Secondary | ICD-10-CM | POA: Diagnosis not present

## 2024-07-16 DIAGNOSIS — R54 Age-related physical debility: Secondary | ICD-10-CM | POA: Insufficient documentation

## 2024-07-16 DIAGNOSIS — R197 Diarrhea, unspecified: Secondary | ICD-10-CM | POA: Diagnosis not present

## 2024-07-16 DIAGNOSIS — F17210 Nicotine dependence, cigarettes, uncomplicated: Secondary | ICD-10-CM | POA: Diagnosis not present

## 2024-07-16 DIAGNOSIS — Z8673 Personal history of transient ischemic attack (TIA), and cerebral infarction without residual deficits: Secondary | ICD-10-CM | POA: Insufficient documentation

## 2024-07-16 DIAGNOSIS — R109 Unspecified abdominal pain: Secondary | ICD-10-CM | POA: Diagnosis present

## 2024-07-16 DIAGNOSIS — R5381 Other malaise: Secondary | ICD-10-CM | POA: Diagnosis present

## 2024-07-16 DIAGNOSIS — I10 Essential (primary) hypertension: Secondary | ICD-10-CM | POA: Diagnosis not present

## 2024-07-16 DIAGNOSIS — N281 Cyst of kidney, acquired: Secondary | ICD-10-CM | POA: Diagnosis not present

## 2024-07-16 LAB — COMPREHENSIVE METABOLIC PANEL WITH GFR
ALT: 14 U/L (ref 0–44)
AST: 17 U/L (ref 15–41)
Albumin: 3.7 g/dL (ref 3.5–5.0)
Alkaline Phosphatase: 76 U/L (ref 38–126)
Anion gap: 11 (ref 5–15)
BUN: 15 mg/dL (ref 8–23)
CO2: 26 mmol/L (ref 22–32)
Calcium: 9.4 mg/dL (ref 8.9–10.3)
Chloride: 100 mmol/L (ref 98–111)
Creatinine, Ser: 1.33 mg/dL — ABNORMAL HIGH (ref 0.44–1.00)
GFR, Estimated: 40 mL/min — ABNORMAL LOW (ref >60)
Glucose, Bld: 135 mg/dL — ABNORMAL HIGH (ref 70–99)
Potassium: 3.8 mmol/L (ref 3.5–5.1)
Sodium: 137 mmol/L (ref 135–145)
Total Bilirubin: 0.6 mg/dL (ref 0.0–1.2)
Total Protein: 7.4 g/dL (ref 6.5–8.1)

## 2024-07-16 LAB — LIPASE, BLOOD: Lipase: 31 U/L (ref 11–51)

## 2024-07-16 LAB — CBC
HCT: 44.9 % (ref 36.0–46.0)
Hemoglobin: 14.1 g/dL (ref 12.0–15.0)
MCH: 27.9 pg (ref 26.0–34.0)
MCHC: 31.4 g/dL (ref 30.0–36.0)
MCV: 88.9 fL (ref 80.0–100.0)
Platelets: 296 K/uL (ref 150–400)
RBC: 5.05 MIL/uL (ref 3.87–5.11)
RDW: 14.6 % (ref 11.5–15.5)
WBC: 11.9 K/uL — ABNORMAL HIGH (ref 4.0–10.5)
nRBC: 0 % (ref 0.0–0.2)

## 2024-07-16 NOTE — ED Triage Notes (Signed)
 Pt bib [facility] from home with complaints of n/d/ abd pain that started an hour and a half ago. Pt ate fast food 2 hours ago. Non ambulatory at baseline due to hx of stroke with right sided deficits. EMS [facility] but refused IV and medication with them.

## 2024-07-17 ENCOUNTER — Emergency Department (HOSPITAL_COMMUNITY)

## 2024-07-17 ENCOUNTER — Observation Stay (HOSPITAL_COMMUNITY)

## 2024-07-17 ENCOUNTER — Encounter (HOSPITAL_COMMUNITY)
Admission: EM | Disposition: A | Discharge status: Skilled Nursing Facility | Payer: Self-pay | Source: Home / Self Care | Attending: Emergency Medicine

## 2024-07-17 ENCOUNTER — Encounter (HOSPITAL_COMMUNITY): Payer: Self-pay | Admitting: Emergency Medicine

## 2024-07-17 DIAGNOSIS — Z8679 Personal history of other diseases of the circulatory system: Secondary | ICD-10-CM | POA: Diagnosis not present

## 2024-07-17 DIAGNOSIS — R140 Abdominal distension (gaseous): Secondary | ICD-10-CM | POA: Diagnosis not present

## 2024-07-17 DIAGNOSIS — I129 Hypertensive chronic kidney disease with stage 1 through stage 4 chronic kidney disease, or unspecified chronic kidney disease: Secondary | ICD-10-CM | POA: Diagnosis not present

## 2024-07-17 DIAGNOSIS — N183 Chronic kidney disease, stage 3 unspecified: Secondary | ICD-10-CM | POA: Diagnosis not present

## 2024-07-17 DIAGNOSIS — K5909 Other constipation: Secondary | ICD-10-CM | POA: Diagnosis not present

## 2024-07-17 DIAGNOSIS — Q438 Other specified congenital malformations of intestine: Secondary | ICD-10-CM

## 2024-07-17 DIAGNOSIS — K458 Other specified abdominal hernia without obstruction or gangrene: Secondary | ICD-10-CM | POA: Diagnosis not present

## 2024-07-17 DIAGNOSIS — Z8673 Personal history of transient ischemic attack (TIA), and cerebral infarction without residual deficits: Secondary | ICD-10-CM

## 2024-07-17 DIAGNOSIS — D125 Benign neoplasm of sigmoid colon: Secondary | ICD-10-CM

## 2024-07-17 DIAGNOSIS — I714 Abdominal aortic aneurysm, without rupture, unspecified: Secondary | ICD-10-CM | POA: Diagnosis not present

## 2024-07-17 DIAGNOSIS — I10 Essential (primary) hypertension: Secondary | ICD-10-CM | POA: Diagnosis not present

## 2024-07-17 DIAGNOSIS — I739 Peripheral vascular disease, unspecified: Secondary | ICD-10-CM | POA: Diagnosis not present

## 2024-07-17 DIAGNOSIS — I6782 Cerebral ischemia: Secondary | ICD-10-CM | POA: Diagnosis not present

## 2024-07-17 DIAGNOSIS — R109 Unspecified abdominal pain: Secondary | ICD-10-CM

## 2024-07-17 DIAGNOSIS — K562 Volvulus: Secondary | ICD-10-CM

## 2024-07-17 DIAGNOSIS — R1084 Generalized abdominal pain: Secondary | ICD-10-CM

## 2024-07-17 DIAGNOSIS — F17210 Nicotine dependence, cigarettes, uncomplicated: Secondary | ICD-10-CM | POA: Diagnosis not present

## 2024-07-17 DIAGNOSIS — K559 Vascular disorder of intestine, unspecified: Secondary | ICD-10-CM | POA: Diagnosis not present

## 2024-07-17 DIAGNOSIS — N281 Cyst of kidney, acquired: Secondary | ICD-10-CM | POA: Diagnosis not present

## 2024-07-17 DIAGNOSIS — R933 Abnormal findings on diagnostic imaging of other parts of digestive tract: Secondary | ICD-10-CM

## 2024-07-17 DIAGNOSIS — D320 Benign neoplasm of cerebral meninges: Secondary | ICD-10-CM | POA: Diagnosis not present

## 2024-07-17 DIAGNOSIS — I712 Thoracic aortic aneurysm, without rupture, unspecified: Secondary | ICD-10-CM | POA: Diagnosis not present

## 2024-07-17 DIAGNOSIS — R29818 Other symptoms and signs involving the nervous system: Secondary | ICD-10-CM | POA: Diagnosis not present

## 2024-07-17 DIAGNOSIS — R220 Localized swelling, mass and lump, head: Secondary | ICD-10-CM | POA: Diagnosis not present

## 2024-07-17 LAB — TROPONIN I (HIGH SENSITIVITY)
Troponin I (High Sensitivity): 11 ng/L (ref <18)
Troponin I (High Sensitivity): 12 ng/L (ref <18)

## 2024-07-17 SURGERY — SIGMOIDOSCOPY, FLEXIBLE
Anesthesia: Monitor Anesthesia Care

## 2024-07-17 MED ORDER — ONDANSETRON HCL 4 MG/2ML IJ SOLN: 4.0000 mg | Freq: Four times a day (QID) | INTRAMUSCULAR | Status: DC | PRN

## 2024-07-17 MED ORDER — ENOXAPARIN SODIUM 40 MG/0.4ML IJ SOSY
40.0000 mg | PREFILLED_SYRINGE | INTRAMUSCULAR | Status: DC
  Administered 2024-07-18 – 2024-07-22 (×4): 40 mg via SUBCUTANEOUS
  Filled 2024-07-17 (×6): qty 0.4

## 2024-07-17 MED ORDER — CLONAZEPAM 0.5 MG PO TABS
0.5000 mg | ORAL_TABLET | Freq: Every day | ORAL | Status: DC
  Administered 2024-07-17 – 2024-07-21 (×5): 0.5 mg via ORAL
  Filled 2024-07-17 (×5): qty 1

## 2024-07-17 MED ORDER — ONDANSETRON HCL 4 MG/2ML IJ SOLN: 4.0000 mg | Freq: Once | INTRAMUSCULAR | Status: DC

## 2024-07-17 MED ORDER — IOHEXOL 350 MG/ML SOLN: 100.0000 mL | Freq: Once | INTRAVENOUS | Status: DC | PRN

## 2024-07-17 MED ORDER — ACETAMINOPHEN 325 MG PO TABS: 650.0000 mg | ORAL_TABLET | Freq: Four times a day (QID) | ORAL | Status: DC | PRN

## 2024-07-17 MED ORDER — ONDANSETRON HCL 4 MG PO TABS
4.0000 mg | ORAL_TABLET | Freq: Four times a day (QID) | ORAL | Status: DC | PRN
Start: 2024-07-17 — End: 2024-07-22

## 2024-07-17 MED ORDER — POLYETHYLENE GLYCOL 3350 17 G PO PACK
17.0000 g | PACK | Freq: Two times a day (BID) | ORAL | Status: DC
  Administered 2024-07-17 – 2024-07-19 (×5): 17 g via ORAL
  Filled 2024-07-17 (×5): qty 1

## 2024-07-17 MED ORDER — ACETAMINOPHEN 650 MG RE SUPP: 650.0000 mg | Freq: Four times a day (QID) | RECTAL | Status: DC | PRN

## 2024-07-17 MED ORDER — DIPHENHYDRAMINE-ZINC ACETATE 2-0.1 % EX CREA
TOPICAL_CREAM | Freq: Every day | CUTANEOUS | Status: DC | PRN
  Filled 2024-07-17 (×3): qty 28

## 2024-07-17 MED ORDER — SODIUM CHLORIDE 0.9 % IV SOLN: INTRAVENOUS | Status: DC | PRN

## 2024-07-17 MED ORDER — LIDOCAINE 2% (20 MG/ML) 5 ML SYRINGE
INTRAMUSCULAR | Status: DC | PRN
  Administered 2024-07-17: 100 mg via INTRAVENOUS

## 2024-07-17 MED ORDER — ROPINIROLE HCL 1 MG PO TABS
2.0000 mg | ORAL_TABLET | Freq: Every day | ORAL | Status: DC
  Administered 2024-07-17 – 2024-07-21 (×5): 2 mg via ORAL
  Filled 2024-07-17 (×5): qty 2

## 2024-07-17 MED ORDER — PROPOFOL 10 MG/ML IV BOLUS
INTRAVENOUS | Status: DC | PRN
  Administered 2024-07-17: 50 mg via INTRAVENOUS
  Administered 2024-07-17: 100 mg via INTRAVENOUS
  Administered 2024-07-17: 50 mg via INTRAVENOUS

## 2024-07-17 MED ORDER — IOHEXOL 350 MG/ML SOLN
100.0000 mL | Freq: Once | INTRAVENOUS | Status: AC | PRN
  Administered 2024-07-17: 100 mL via INTRAVENOUS

## 2024-07-17 NOTE — Plan of Care (Signed)
  Problem: Education: Goal: Knowledge of General Education information will improve Description: Including pain rating scale, medication(s)/side effects and non-pharmacologic comfort measures Outcome: Progressing   Problem: Health Behavior/Discharge Planning: Goal: Ability to manage health-related needs will improve Outcome: Progressing   Problem: Clinical Measurements: Goal: Ability to maintain clinical measurements within normal limits will improve Outcome: Progressing Goal: Will remain free from infection Outcome: Progressing Goal: Diagnostic test results will improve Outcome: Progressing Goal: Respiratory complications will improve Outcome: Progressing Goal: Cardiovascular complication will be avoided Outcome: Progressing   Problem: Activity: Goal: Risk for activity intolerance will decrease Outcome: Progressing   Problem: Nutrition: Goal: Adequate nutrition will be maintained Outcome: Progressing   Problem: Elimination: Goal: Will not experience complications related to bowel motility Outcome: Progressing Goal: Will not experience complications related to urinary retention Outcome: Progressing   Problem: Pain Managment: Goal: General experience of comfort will improve and/or be controlled Outcome: Progressing

## 2024-07-17 NOTE — ED Notes (Signed)
 MD notified that patient refused lovenox.

## 2024-07-17 NOTE — Anesthesia Postprocedure Evaluation (Signed)
 Anesthesia Post Note  Patient: [name]  Procedure(s) Performed: KINGSTON SIDE     Patient location during evaluation: PACU Anesthesia Type: MAC Level of consciousness: awake Pain management: pain level controlled Vital Signs Assessment: post-procedure vital signs reviewed and stable Respiratory status: spontaneous breathing, nonlabored ventilation and respiratory function stable Cardiovascular status: stable and blood pressure returned to baseline Postop Assessment: no apparent nausea or vomiting Anesthetic complications: no   No notable events documented.  Last Vitals:  Vitals:   07/17/24 1256 07/17/24 1300  BP: 104/70 120/61  Pulse: 86 82  Resp: 14 18  Temp:    SpO2: 96% 99%    Last Pain:  Vitals:   07/17/24 1250  TempSrc: Temporal  PainSc:                  Delon Aisha Arch

## 2024-07-17 NOTE — Transfer of Care (Signed)
 Immediate Anesthesia Transfer of Care Note  Patient: [name]  Procedure(s) Performed: KINGSTON SIDE  Patient Location: Endoscopy Unit  Anesthesia Type:MAC  Level of Consciousness: awake, alert , and oriented  Airway & Oxygen  Therapy: Patient Spontanous Breathing and Patient connected to nasal cannula oxygen   Post-op Assessment: Report given to RN and Post -op Vital signs reviewed and stable  Post vital signs: Reviewed and stable  Last Vitals:  Vitals Value Taken Time  BP 108/66 07/17/24 12:50  Temp    Pulse 90 07/17/24 12:51  Resp 14 07/17/24 12:51  SpO2 99 % 07/17/24 12:51  Vitals shown include unfiled device data.  Last Pain:  Vitals:   07/17/24 1159  TempSrc: Temporal  PainSc: 0-No pain         Complications: No notable events documented.

## 2024-07-17 NOTE — Consult Note (Signed)
 Consultation  Referring Provider: ER MD/[facility] Primary Care Physician:  Levora Reyes SAUNDERS, MD Primary Gastroenterologist:  Dr. Leigh  Reason for Consultation: Acute abdominal pain, concern for sigmoid volvulus  HPI: [name] is a 78 y.o. female with multiple medical problems, including history of previous CVAs, peripheral arterial disease,, on chronic Plavix  and aspirin , chronic kidney disease, history of abdominal aortic aneurysm, hypertension, and chronic constipation. Patient presented to the emergency room last night after she had onset yesterday of rather generalized abdominal pain and cramping that was present for a few hours and constant prior to coming to the ER.  She says the pain was a 10 out of 10.  She was nauseated but did not vomit.  Just prior to coming to the emergency room she started having some bowel movements and apparently had a massive amount of loose and watery stool nonbloody. She started feeling better after the bowel movement.  She does have longstanding chronic colon dysmotility issues but says recently she had been doing okay and had not been taking any regular Linzess  or laxatives. Workup in the ER with CTA due to the severity of the pain.  Extensive atherosclerotic disease with tortuosity and 3.5 x 3.2 cm infrarenal abdominal aortic aneurysm, there is an area of mesenteric twisting seen in the medial aspect of the mid to lower left abdomen and as a result moderately thickened and poorly distended loops of sigmoid colon within the abdomen on the right which could represent an internal hernia versus early sigmoid volvulus.  Labs on arrival with WBC 11.9/hemoglobin 14/hematocrit 44.9/MCV 88.9 Appears within normal limits Sodium 137/potassium 3.8/BUN 15/creatinine 1.33 LFTs within normal limits Trop  negative  Patient says that she feels pretty good this morning her pain has resolved, has not recurred, she has been able to tolerate clear liquids.  She  has not had any further bowel movements or diarrhea.  She says she has never had any similar episodes of this type of abdominal pain.  Last EGD was done in July 2021 per Dr. Leigh with a small hiatal hernia and otherwise negative exam. Last colonoscopy per Dr. Cristobal 2009 with tortuous hypotonic colon with inability to traverse beyond the hepatic flexure.   Past Medical History:  Diagnosis Date   AAA (abdominal aortic aneurysm)    3.1 cm 07/08/18, 3 year follow-up recommended; possible 3 cm AAA by aortogram 09/13/18   Anemia    PMH   Appendicitis with abscess    07/08/18, s/p perc drain; resolved 07/30/18 by CT   Arthritis    Cerebrovascular disease    intra and extracranial vascular dx per MRI 4/11, neurology rec strict CVRF control   Colonic inertia    Constipation    chronic;severe   Depression    Duodenitis    Gastritis    GERD (gastroesophageal reflux disease)    Hypertension    Hypothyroid 01/16/2014   Meningioma (HCC) 10/21/2013   PAD (peripheral artery disease)    Peripheral arterial disease    Psoriasis    sees derm   Wears dentures    Wears glasses     Past Surgical History:  Procedure Laterality Date   ABDOMINAL AORTOGRAM W/LOWER EXTREMITY N/A 09/13/2018   Procedure: ABDOMINAL AORTOGRAM W/LOWER EXTREMITY;  Surgeon: Harvey Carlin BRAVO, MD;  Location: [facility] INVASIVE CV LAB;  Service: Cardiovascular;  Laterality: N/A;   ABDOMINAL AORTOGRAM W/LOWER EXTREMITY N/A 07/17/2023   Procedure: ABDOMINAL AORTOGRAM W/LOWER EXTREMITY;  Surgeon: Serene Gaile ORN, MD;  Location: [facility]  INVASIVE CV LAB;  Service: Cardiovascular;  Laterality: N/A;   arthroscopy  04/2010   Right knee   CATARACT EXTRACTION W/ INTRAOCULAR LENS  IMPLANT, BILATERAL     COLONOSCOPY     FEMORAL-POPLITEAL BYPASS GRAFT  10/22/2018   FEMORAL-POPLITEAL BYPASS GRAFT Left 10/22/2018   Procedure: LEFT FEMORAL TO BELOW THE KNEE POPLITEAL ARTERY BYPASS GRAFT;  Surgeon: Harvey Carlin BRAVO, MD;  Location: [facility] OR;  Service:  Vascular;  Laterality: Left;   I & D EXTREMITY Left 11/08/2018   Procedure: IRRIGATION AND DEBRIDEMENT EXTREMITY Left Leg;  Surgeon: Harvey Carlin BRAVO, MD;  Location: [facility] OR;  Service: Vascular;  Laterality: Left;   IR RADIOLOGIST EVAL & MGMT  07/30/2018   MULTIPLE TOOTH EXTRACTIONS     PERIPHERAL VASCULAR THROMBECTOMY N/A 07/18/2023   Procedure: LYSIS RECHECK;  Surgeon: Lanis Fonda BRAVO, MD;  Location: [facility] INVASIVE CV LAB;  Service: Cardiovascular;  Laterality: N/A;   TUBAL LIGATION      Prior to Admission medications   Medication Sig Start Date End Date Taking? Authorizing Provider  acetaminophen  (TYLENOL ) 500 MG tablet Take 500 mg by mouth See admin instructions. Give 1 tablet (500mg ) by mouth at bedtime AND give 1 tablet every 4 hours as needed for pain. Not to exceed 3G daily from all sources.   Yes [provider]  albuterol  (VENTOLIN  HFA) 108 (90 Base) MCG/ACT inhaler Inhale 1-2 puffs into the lungs every 6 (six) hours as needed for wheezing or shortness of breath. 05/31/23  Yes Levora Reyes SAUNDERS, MD  amLODipine  (NORVASC ) 5 MG tablet Take 1 tablet (5 mg total) by mouth daily. 02/07/23  Yes Lavona Agent, MD  aspirin  EC 81 MG tablet Take 81 mg by mouth daily.   Yes [provider]  bisacodyl  (DULCOLAX) 5 MG EC tablet Take 5 mg by mouth daily as needed (constipation).   Yes [provider]  Camphor-Menthol-Methyl Sal (MUSCLE RUB ULTRA STRENGTH) 12-19-28 % CREA Apply 1 application  topically every 4 (four) hours as needed (pain). apply to shoulder and neck   Yes [provider]  clonazePAM  (KLONOPIN ) 0.5 MG tablet Take 0.5 mg by mouth at bedtime.   Yes [provider]  clopidogrel  (PLAVIX ) 75 MG tablet Take 1 tablet (75 mg total) by mouth daily with breakfast. Patient taking differently: Take 75 mg by mouth daily. 07/24/23  Yes Christobal Guadalajara, MD  cyanocobalamin (VITAMIN B12) 500 MCG tablet Take 500 mcg by mouth daily.   Yes [provider]   DULoxetine  (CYMBALTA ) 30 MG capsule Take 30 mg by mouth at bedtime.   Yes [provider]  DULoxetine  (CYMBALTA ) 60 MG capsule Take 60 mg by mouth daily.   Yes [provider]  ezetimibe  (ZETIA ) 10 MG tablet Take 1 tablet (10 mg total) by mouth daily. 07/24/23  Yes Christobal Guadalajara, MD  linaclotide  (LINZESS ) 290 MCG CAPS capsule Take 1 capsule (290 mcg total) by mouth daily before breakfast. Patient taking differently: Take 290 mcg by mouth daily. 07/23/23  Yes Christobal Guadalajara, MD  losartan  (COZAAR ) 50 MG tablet Take 50 mg by mouth daily.   Yes [provider]  meclizine  (ANTIVERT ) 25 MG tablet Take 25 mg by mouth every 8 (eight) hours as needed for dizziness.   Yes [provider]  Menthol, Topical Analgesic, (BIOFREEZE COOL THE PAIN) 10 % CREA Apply 1 application  topically daily as needed (spasms). apply to left side of neck   Yes [provider]  Menthol-Zinc Oxide (CALMOSEPTINE) 0.44-20.6 %  OINT Apply 1 application  topically See admin instructions. Apply topically to buttocks/sacrum three times daily with every shift AND apply with each toileting/incontinent change.   Yes [provider]  polyethylene glycol (MIRALAX  / GLYCOLAX ) 17 g packet Take 34 g by mouth daily. Patient taking differently: Take 17 g by mouth daily. 03/08/23  Yes Tobie Yetta HERO, MD  rOPINIRole  (REQUIP ) 2 MG tablet TAKE 1 TABLET(2 MG) BY MOUTH AT BEDTIME 03/30/23  Yes Cottle, Lorene KANDICE Raddle., MD  Sennosides 25 MG TABS Take 25 mg by mouth 2 (two) times daily.   Yes [provider]  simethicone  (MYLICON) 80 MG chewable tablet Chew 1 tablet (80 mg total) by mouth 4 (four) times daily. Patient taking differently: Chew 80 mg by mouth every 6 (six) hours as needed for flatulence. 03/07/23  Yes Tobie Yetta HERO, MD  triamcinolone  cream (KENALOG ) 0.1 % Apply 1 Application topically 2 (two) times daily.   Yes [provider]  PRESCRIPTION MEDICATION Take 1 capsule by mouth daily.  Prokinetic. Unknown name and strength.    [provider]    Current Facility-Administered Medications  Medication Dose Route Frequency Provider Last Rate Last Admin   acetaminophen  (TYLENOL ) tablet 650 mg  650 mg Oral Q6H PRN Newton, Steven J, MD       Or   acetaminophen  (TYLENOL ) suppository 650 mg  650 mg Rectal Q6H PRN Newton, Steven J, MD       diphenhydrAMINE-zinc acetate (BENADRYL) 2-0.1 % cream   Topical Daily PRN Griselda Norris, MD       enoxaparin  (LOVENOX ) injection 40 mg  40 mg Subcutaneous Q24H Eldonna Elspeth PARAS, MD       iohexol  (OMNIPAQUE ) 350 MG/ML injection 100 mL  100 mL Intravenous Once PRN Griselda Norris, MD       ondansetron  (ZOFRAN ) injection 4 mg  4 mg Intravenous Once Griselda Norris, MD       ondansetron  (ZOFRAN ) tablet 4 mg  4 mg Oral Q6H PRN Eldonna Elspeth PARAS, MD       Or   ondansetron  (ZOFRAN ) injection 4 mg  4 mg Intravenous Q6H PRN Newton, Steven J, MD       Current Outpatient Medications  Medication Sig Dispense Refill   acetaminophen  (TYLENOL ) 500 MG tablet Take 500 mg by mouth See admin instructions. Give 1 tablet (500mg ) by mouth at bedtime AND give 1 tablet every 4 hours as needed for pain. Not to exceed 3G daily from all sources.     albuterol  (VENTOLIN  HFA) 108 (90 Base) MCG/ACT inhaler Inhale 1-2 puffs into the lungs every 6 (six) hours as needed for wheezing or shortness of breath. 18 g 2   amLODipine  (NORVASC ) 5 MG tablet Take 1 tablet (5 mg total) by mouth daily. 90 tablet 3   aspirin  EC 81 MG tablet Take 81 mg by mouth daily.     bisacodyl  (DULCOLAX) 5 MG EC tablet Take 5 mg by mouth daily as needed (constipation).     Camphor-Menthol-Methyl Sal (MUSCLE RUB ULTRA STRENGTH) 12-19-28 % CREA Apply 1 application  topically every 4 (four) hours as needed (pain). apply to shoulder and neck     clonazePAM  (KLONOPIN ) 0.5 MG tablet Take 0.5 mg by mouth at bedtime.     clopidogrel  (PLAVIX ) 75 MG tablet Take 1 tablet (75 mg total) by mouth daily with  breakfast. (Patient taking differently: Take 75 mg by mouth daily.)     cyanocobalamin (VITAMIN B12) 500 MCG tablet Take 500 mcg by  mouth daily.     DULoxetine  (CYMBALTA ) 30 MG capsule Take 30 mg by mouth at bedtime.     DULoxetine  (CYMBALTA ) 60 MG capsule Take 60 mg by mouth daily.     ezetimibe  (ZETIA ) 10 MG tablet Take 1 tablet (10 mg total) by mouth daily.     linaclotide  (LINZESS ) 290 MCG CAPS capsule Take 1 capsule (290 mcg total) by mouth daily before breakfast. (Patient taking differently: Take 290 mcg by mouth daily.)     losartan  (COZAAR ) 50 MG tablet Take 50 mg by mouth daily.     meclizine  (ANTIVERT ) 25 MG tablet Take 25 mg by mouth every 8 (eight) hours as needed for dizziness.     Menthol, Topical Analgesic, (BIOFREEZE COOL THE PAIN) 10 % CREA Apply 1 application  topically daily as needed (spasms). apply to left side of neck     Menthol-Zinc Oxide (CALMOSEPTINE) 0.44-20.6 % OINT Apply 1 application  topically See admin instructions. Apply topically to buttocks/sacrum three times daily with every shift AND apply with each toileting/incontinent change.     polyethylene glycol (MIRALAX  / GLYCOLAX ) 17 g packet Take 34 g by mouth daily. (Patient taking differently: Take 17 g by mouth daily.) 14 each 0   rOPINIRole  (REQUIP ) 2 MG tablet TAKE 1 TABLET(2 MG) BY MOUTH AT BEDTIME 60 tablet 0   Sennosides 25 MG TABS Take 25 mg by mouth 2 (two) times daily.     simethicone  (MYLICON) 80 MG chewable tablet Chew 1 tablet (80 mg total) by mouth 4 (four) times daily. (Patient taking differently: Chew 80 mg by mouth every 6 (six) hours as needed for flatulence.) 30 tablet 0   triamcinolone  cream (KENALOG ) 0.1 % Apply 1 Application topically 2 (two) times daily.     PRESCRIPTION MEDICATION Take 1 capsule by mouth daily. Prokinetic. Unknown name and strength.      Allergies as of 07/16/2024 - Review Complete 07/16/2024  Allergen Reaction Noted   Lipitor [atorvastatin ] Other (See Comments) 07/18/2023    Pravastatin Other (See Comments) 07/17/2013   Rosuvastatin  Other (See Comments) 07/18/2023    Family History  Problem Relation Age of Onset   Breast cancer Mother        metastisis to bones   Cancer Sister 80       spinal   Alcohol abuse Brother    Heart disease Brother    Lung cancer Brother        smoked   Diabetes Son    Heart disease Maternal Aunt    Heart disease Other        grandfather    Colon cancer Neg Hx    Esophageal cancer Neg Hx    Stomach cancer Neg Hx     Social History   Socioeconomic History   Marital status: Widowed    Spouse name: Not on file   Number of children: 1   Years of education: Not on file   Highest education level: Not on file  Occupational History   Occupation: retired  Tobacco Use   Smoking status: Every Day    Current packs/day: 0.00    Average packs/day: 0.5 packs/day for 60.0 years (30.0 ttl pk-yrs)    Types: Cigarettes    Start date: 11/10/1959    Last attempt to quit: 11/10/2019    Years since quitting: 4.6   Smokeless tobacco: Never  Vaping Use   Vaping status: Former  Substance and Sexual Activity   Alcohol use: Yes    Alcohol/week: 0.0  standard drinks of alcohol    Comment: yes on occassion   Drug use: No   Sexual activity: Not Currently  Other Topics Concern   Not on file  Social History Narrative   Brother in law Mr Antonetta Salinas   One son in [place].    Social Drivers of Corporate Investment Banker Strain: Low Risk  (03/23/2021)   Overall Financial Resource Strain (CARDIA)    Difficulty of Paying Living Expenses: Not hard at all  Food Insecurity: No Food Insecurity (07/16/2023)   Hunger Vital Sign    Worried About Running Out of Food in the Last Year: Never true    Ran Out of Food in the Last Year: Never true  Transportation Needs: No Transportation Needs (07/16/2023)   PRAPARE - Administrator, Civil Service (Medical): No    Lack of Transportation (Non-Medical): No  Physical Activity: Inactive  (06/13/2023)   Exercise Vital Sign    Days of Exercise per Week: 0 days    Minutes of Exercise per Session: 0 min  Stress: Stress Concern Present (06/13/2023)   [employer] of Occupational Health - Occupational Stress Questionnaire    Feeling of Stress : Very much  Social Connections: Socially Isolated (06/13/2023)   Social Connection and Isolation Panel    Frequency of Communication with Friends and Family: Three times a week    Frequency of Social Gatherings with Friends and Family: Twice a week    Attends Religious Services: Never    Database Administrator or Organizations: No    Attends Banker Meetings: Never    Marital Status: Widowed  Intimate Partner Violence: Not At Risk (07/16/2023)   Humiliation, Afraid, Rape, and Kick questionnaire    Fear of Current or Ex-Partner: No    Emotionally Abused: No    Physically Abused: No    Sexually Abused: No    Review of Systems: Pertinent positive and negative review of systems were noted in the above HPI section.  All other review of systems was otherwise negative.   Physical Exam: Vital signs in last 24 hours: Temp:  [96.8 F (36 C)-97.6 F (36.4 C)] 97.6 F (36.4 C) (11/06 9391) Pulse Rate:  [80-89] 80 (11/06 0608) Resp:  [16-20] 16 (11/06 9391) BP: (123-150)/(73-79) 150/79 (11/06 9391) SpO2:  [98 %-100 %] 100 % (11/06 9391)   General:   Alert,  Well-developed, well-nourished, elderly white female pleasant and cooperative in NAD, slight slurring of speech Head:  Normocephalic and atraumatic. Eyes:  Sclera clear, no icterus.   Conjunctiva pink. Ears:  Normal auditory acuity. Nose:  No deformity, discharge,  or lesions. Mouth:  No deformity or lesions.  Mild right facial droop Neck:  Supple; no masses or thyromegaly. Lungs:  Clear throughout to auscultation.   No wheezes, crackles, or rhonchi.  Heart:  Regular rate and rhythm; no murmurs, clicks, rubs,  or gallops. Abdomen:  Soft, currently nondistended,  nontender, bowel sounds are present, no palpable mass or hepatosplenomegaly Rectal: Not done Msk:  Symmetrical without gross deformities. . Pulses:  Normal pulses noted. Extremities:  Without clubbing or edema. Neurologic:  Alert and  oriented x4; mild right sided deficits Skin:  Intact without significant lesions or rashes.. Psych:  Alert and cooperative. Normal mood and affect.  Intake/Output from previous day: No intake/output data recorded. Intake/Output this shift: No intake/output data recorded.  Lab Results: Recent Labs    07/16/24 2205  WBC 11.9*  HGB 14.1  HCT 44.9  PLT 296   BMET Recent Labs    07/16/24 2205  NA 137  K 3.8  CL 100  CO2 26  GLUCOSE 135*  BUN 15  CREATININE 1.33*  CALCIUM  9.4   LFT Recent Labs    07/16/24 2205  PROT 7.4  ALBUMIN  3.7  AST 17  ALT 14  ALKPHOS 76  BILITOT 0.6   PT/INR No results for input(s): LABPROT, INR in the last 72 hours. Hepatitis Panel No results for input(s): HEPBSAG, HCVAB, HEPAIGM, HEPBIGM in the last 72 hours.    IMPRESSION:  #27 78 year old white female with long history of colonic dysmotility and previously documented long tortuous hypotonic colon who presented to the ER last night after onset of rather generalized abdominal pain and cramping which she described as severe and associated with nausea but no vomiting.  She eventually started having bowel movement which was described as a massive amount of loose and diarrheal stool. Pain improved after that.  In the ER to include CT of the chest abdomen and pelvis concerning for early sigmoid volvulus versus internal hernia.  Patient is stable this morning, has not had any recurrence of symptoms and abdomen is benign.  She has been evaluated by surgery and flexible sigmoidoscopy recommended.  Suspect that she did have a sigmoid volvulus which self resolved, certain at this time before high incidence of recurrence  #2 chronic antiplatelet  therapy-on Plavix  and aspirin  3 history of previous CVAs 4.  Peripheral arterial disease status post prior femoropopliteal bypass 5.  Infrarenal abdominal aortic aneurysm 6.  Chronic kidney disease 7.  Hypertension  Plan; keep n.p.o.-patient did have clear liquids up until around 830 this morning Will schedule for flexible sigmoidoscopy with Dr. Albertus today.  Procedure was discussed in detail with the patient including indications risks and benefits and she is agreeable to proceed She did get Lovenox  this morning. Further GI recommendations pending findings at sigmoidoscopy.   Erdem Naas PA-C 07/17/2024, 9:46 AM

## 2024-07-17 NOTE — Assessment & Plan Note (Signed)
 Noted baseline peripheral vascular disease with history of femoropopliteal bypass as well as occlusion status post thrombectomy Neurovascularly intact distally Continue home regimen including aspirin  and Zetia  Monitor

## 2024-07-17 NOTE — H&P (Addendum)
 History and Physical    Patient: [name] FMW:[id number] DOB: [birth year] DOA: 07/16/2024 DOS: the patient was seen and examined on 07/17/2024 PCP: Levora Reyes SAUNDERS, MD  Patient coming from: Home  Chief Complaint:  Chief Complaint  Patient presents with   Abdominal Pain   HPI: [name] is a 78 y.o. 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  Patient reports sudden onset of generalized lower abdominal pain overnight with nausea vomiting as well as increased bowel movements.  Patient denies any prior episodes like this in the past.  Denies any recent dietary changes though she did report eating some fast food prior to onset.  She is unsure if this may have been a possible trigger.  No reported heavy extraneous activity.  Patient denies any known history of hernias in the past.  Noted prior abdominal surgeries including hysterectomy in the past.  Abdominal pain progressively worsened over the course of the night into the morning with patient subsequently calling EMS. Presented to the ER afebrile, hemodynamically stable.  Satting well on room air.  White count 11.9, hemoglobin 14.1, platelets 296, creatinine 1.33.  Troponin negative x 2.  CT of the chest abdomen pelvis obtained in the setting of history of AAA which showed severe calcification of thoracic aorta with no aneurysm dilatation, 3.5 x 3.2 cm aneurysm dilatation of the infrarenal abdominal aorta, significant stenosis of right renal artery, mesenteric twisting within the medial aspect of the mid to lower abdomen with concern for internal hernia versus early sigmoid volvulus.  Mild air in the endometrial canal. Review of Systems: As mentioned in the history of present illness. All other systems reviewed  and are negative. Past Medical History:  Diagnosis Date   AAA (abdominal aortic aneurysm)    3.1 cm 07/08/18, 3 year follow-up recommended; possible 3 cm AAA by aortogram 09/13/18   Anemia    PMH   Appendicitis with abscess    07/08/18, s/p perc drain; resolved 07/30/18 by CT   Arthritis    Cerebrovascular disease    intra and extracranial vascular dx per MRI 4/11, neurology rec strict CVRF control   Colonic inertia    Constipation    chronic;severe   Depression    Duodenitis    Gastritis    GERD (gastroesophageal reflux disease)    Hypertension    Hypothyroid 01/16/2014   Meningioma (HCC) 10/21/2013   PAD (peripheral artery disease)    Peripheral arterial disease    Psoriasis    sees derm   Wears dentures    Wears glasses    Past Surgical History:  Procedure Laterality Date   ABDOMINAL AORTOGRAM W/LOWER EXTREMITY N/A 09/13/2018   Procedure: ABDOMINAL AORTOGRAM W/LOWER EXTREMITY;  Surgeon: Harvey Carlin BRAVO, MD;  Location: [facility];  Service: Cardiovascular;  Laterality: N/A;   ABDOMINAL AORTOGRAM W/LOWER EXTREMITY N/A 07/17/2023   Procedure: ABDOMINAL AORTOGRAM W/LOWER EXTREMITY;  Surgeon: Serene Gaile ORN, MD;  Location: [facility];  Service: Cardiovascular;  Laterality: N/A;   arthroscopy  04/2010   Right knee   CATARACT EXTRACTION W/ INTRAOCULAR LENS  IMPLANT, BILATERAL     COLONOSCOPY     FEMORAL-POPLITEAL BYPASS GRAFT  10/22/2018   FEMORAL-POPLITEAL BYPASS GRAFT Left 10/22/2018   Procedure: LEFT FEMORAL TO BELOW THE KNEE POPLITEAL ARTERY BYPASS GRAFT;  Surgeon: Harvey Carlin  E, MD;  Location: [facility] OR;  Service: Vascular;  Laterality: Left;   I & D EXTREMITY Left 11/08/2018   Procedure: IRRIGATION AND DEBRIDEMENT EXTREMITY Left Leg;  Surgeon: Harvey Carlin BRAVO, MD;  Location[facility];  Service: Vascular;  Laterality: Left;   IR RADIOLOGIST EVAL & MGMT  07/30/2018   MULTIPLE TOOTH EXTRACTIONS     PERIPHERAL VASCULAR THROMBECTOMY N/A 07/18/2023   Procedure:  LYSIS RECHECK;  Surgeon: Lanis Fonda BRAVO, MD;  Location: [facility] IN[facility]IVE CV [facility];  Service: Cardiovascular;  Laterality: N/A;   TUBAL LIGATION     Social History:  reports that she has been smoking cigarettes. She started smoking about 64 years ago. She has a 30 pack-year smoking history. She has never used smokeless tobacco. She reports current alcohol use. She reports that she does not use drugs.  Allergies  Allergen Reactions   Lipitor [Atorvastatin ] Other (See Comments)    Self reported myalgias   Pravastatin Other (See Comments)    myalgias   Rosuvastatin  Other (See Comments)    Self reported myalgias    Family History  Problem Relation Age of Onset   Breast cancer Mother        metastisis to bones   Cancer Sister 58       spinal   Alcohol abuse Brother    Heart disease Brother    Lung cancer Brother        smoked   Diabetes Son    Heart disease Maternal Aunt    Heart disease Other        grandfather    Colon cancer Neg Hx    Esophageal cancer Neg Hx    Stomach cancer Neg Hx     Prior to Admission medications   Medication Sig Start Date End Date Taking? Authorizing Provider  acetaminophen  (TYLENOL ) 325 MG tablet Take 2 tablets (650 mg total) by mouth every 6 (six) hours as needed for headache or mild pain (pain score 1-3). 07/23/23   Christobal Guadalajara, MD  albuterol  (VENTOLIN  HFA) 108 (90 Base) MCG/ACT inhaler Inhale 1-2 puffs into the lungs every 6 (six) hours as needed for wheezing or shortness of breath. 05/31/23   Levora Reyes SAUNDERS, MD  amLODipine  (NORVASC ) 5 MG tablet Take 1 tablet (5 mg total) by mouth daily. 02/07/23   Lavona Agent, MD  aspirin  EC 81 MG tablet Take 81 mg by mouth daily. Swallow whole.    [provider]  clonazePAM  (KLONOPIN ) 0.5 MG tablet Take 0.5 mg by mouth daily.    [provider]  clopidogrel  (PLAVIX ) 75 MG tablet Take 1 tablet (75 mg total) by mouth daily with breakfast. 07/24/23   Christobal Guadalajara, MD  DULoxetine  (CYMBALTA ) 30 MG  capsule Take 1 capsule (30 mg total) by mouth daily. Patient taking differently: Take 60 mg by mouth daily. 10/12/23   Cottle, Lorene KANDICE Raddle., MD  ezetimibe  (ZETIA ) 10 MG tablet Take 1 tablet (10 mg total) by mouth daily. 07/24/23   Christobal Guadalajara, MD  linaclotide  (LINZESS ) 290 MCG CAPS capsule Take 1 capsule (290 mcg total) by mouth daily before breakfast. 07/23/23   Christobal Guadalajara, MD  losartan  (COZAAR ) 25 MG tablet Take 1 tablet (25 mg total) by mouth daily. 02/23/23 02/23/24  Lavona Agent, MD  polyethylene glycol (MIRALAX  / GLYCOLAX ) 17 g packet Take 34 g by mouth daily. 03/08/23   Tobie Yetta HERO, MD  PRESCRIPTION MEDICATION Take 1 capsule by mouth daily. Prokinetic. Unknown name and strength.    [provider]  rOPINIRole  (REQUIP ) 2 MG tablet TAKE 1 TABLET(2 MG) BY MOUTH AT BEDTIME Patient taking differently: Take 2 mg by mouth at bedtime. 03/30/23   Cottle, Lorene KANDICE Raddle., MD  Sennosides 25 MG TABS Take by mouth.    [provider]  simethicone  (MYLICON) 80 MG chewable tablet Chew 1 tablet (80 mg total) by mouth 4 (four) times daily. 03/07/23   Tobie Yetta HERO, MD  traMADol  (ULTRAM ) 50 MG tablet Take 1 tablet (50 mg total) by mouth every 6 (six) hours as needed for severe pain (pain score 7-10). 09/21/23   Neysa Caron PARAS, DO    Physical Exam: Vitals:   07/16/24 2137 07/17/24 0219 07/17/24 0608  BP: 123/73 127/79 (!) 150/79  Pulse: 89 84 80  Resp: 20 17 16   Temp: (!) 96.8 F (36 C) 97.6 F (36.4 C) 97.6 F (36.4 C)  TempSrc: Axillary  Oral  SpO2: 99% 98% 100%   Physical Exam Constitutional:      Appearance: She is normal weight.  HENT:     Head: Normocephalic and atraumatic.     Nose: Nose normal.     Mouth/Throat:     Mouth: Mucous membranes are moist.  Eyes:     Pupils: Pupils are equal, round, and reactive to light.  Cardiovascular:     Rate and Rhythm: Normal rate and regular rhythm.  Pulmonary:     Effort: Pulmonary effort is normal.  Abdominal:     General:  Bowel sounds are normal.  Musculoskeletal:     Comments: Positive right upper and lower extremity weakness-chronic  Skin:    General: Skin is warm.  Neurological:     Comments: Positive dysarthria and chronic right-sided deficits/weakness  Psychiatric:        Mood and Affect: Mood normal.     Data Reviewed:  There are no new results to review at this time.  CT Angio Chest/Abd/Pel for Dissection W and/or W/WO CLINICAL DATA:  Abdominal pain with nausea and vomiting.  EXAM: CT ANGIOGRAPHY CHEST, ABDOMEN AND PELVIS  TECHNIQUE: Non-contrast CT of the chest was initially obtained.  Multidetector CT imaging through the chest, abdomen and pelvis was performed using the standard protocol during bolus administration of intravenous contrast. Multiplanar reconstructed images and MIPs were obtained and reviewed to evaluate the vascular anatomy.  RADIATION DOSE REDUCTION: This exam was performed according to the departmental dose-optimization program which includes automated exposure control, adjustment of the mA and/or kV according to patient size and/or use of iterative reconstruction technique.  CONTRAST:  OMNIPAQUE  IOHEXOL  350 MG/ML SOLN  COMPARISON:  October 02, 2023  FINDINGS: CTA CHEST FINDINGS  Cardiovascular: There is marked severity calcification of the thoracic aorta. The ascending thoracic aorta measures 3.2 cm in diameter, while the descending thoracic aorta measures approximately 3.7 cm. Satisfactory opacification of the pulmonary arteries to the segmental level. No evidence of pulmonary embolism. There is mild cardiomegaly. No pericardial effusion.  Mediastinum/Nodes: No enlarged mediastinal, hilar, or axillary lymph nodes. Thyroid  gland, trachea, and esophagus demonstrate no significant findings.  Lungs/Pleura: There is evidence of extensive emphysematous lung disease. Moderate severity areas of scarring, fibrosis and atelectasis are seen within the  bilateral lung bases and posterior aspect of the right upper lobe. No pleural effusion or pneumothorax is identified.  Musculoskeletal: Multilevel degenerative changes are seen throughout the thoracic spine.  Review of the MIP images confirms the above findings.  CTA ABDOMEN AND PELVIS FINDINGS  VASCULAR  Aorta: There is extensive aortic  atherosclerosis with tortuosity and 3.5 cm x 3.2 cm aneurysmal dilatation of the infrarenal abdominal aorta. There is no evidence of dissection, vasculitis or significant stenosis.  Celiac and SMA: A shared origin of the celiac and superior mesenteric arteries is suspected, as separate origins for each cannot be identified. There is no evidence of aneurysmal dilatation, dissection, vasculitis or significant stenosis.  Renals: Both renal arteries are patent, with marked severity narrowing and suspected hemodynamically significant stenosis of the origin of the right renal artery. There is no evidence of renal artery aneurysm, dissection, vasculitis, fibromuscular dysplasia.  IMA: Patent without evidence of aneurysm, dissection, vasculitis or significant stenosis.  Inflow: Patent with extensive atherosclerotic disease. No evidence of aneurysm, dissection, vasculitis or significant stenosis. A left common femoral artery stent is noted.  Veins: No obvious venous abnormality within the limitations of this arterial phase study.  Review of the MIP images confirms the above findings.  NON-VASCULAR  Hepatobiliary: No focal liver abnormality is seen. The gallbladder is contracted without evidence of gallstones, gallbladder wall thickening, or biliary dilatation.  Pancreas: Unremarkable. No pancreatic ductal dilatation or surrounding inflammatory changes.  Spleen: Normal in size without focal abnormality.  Adrenals/Urinary Tract: There is mild diffuse enlargement of the left adrenal gland. The right adrenal gland is unremarkable. Kidneys are  normal in size, without renal calculi or hydronephrosis. A 2.7 cm simple cyst is seen within the lower pole of the left kidney. A 1.2 cm simple cyst is noted within the medial aspect of the mid right kidney. Bladder is unremarkable.  Stomach/Bowel: Stomach is within normal limits. Appendix appears normal. No evidence of bowel dilatation. An area of mesenteric twisting is seen within the medial aspect of the mid to lower left abdomen (axial CT images 210 through 249, CT series 7). As a result, moderately thickened and poorly distended loops of sigmoid colon are seen within the abdomen on the right.  Lymphatic: No abnormal abdominal or pelvic lymph nodes are identified.  Reproductive: A mild-to-moderate amount of air attenuation is seen within the endometrial canal (axial CT images 242 through 254, CT series 7. The bilateral adnexa are unremarkable.  Other: No abdominal wall hernia or abnormality. No abdominopelvic ascites.  Musculoskeletal: Multilevel degenerative changes seen throughout the lumbar spine.  Review of the MIP images confirms the above findings.  IMPRESSION: 1. No evidence of pulmonary embolism. 2. 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 3. Extensive aortic atherosclerosis with tortuosity and 3.5 cm x 3.2 cm aneurysmal dilatation of the infrarenal abdominal aorta. 4. Marked severity narrowing and suspected hemodynamically significant stenosis of the origin of the right renal artery. 5. Area of mesenteric twisting within the medial aspect of the mid to lower left abdomen, with resultant inflamed loops of sigmoid colon noted within the abdomen on the right. While this may represent an internal hernia, follow-up with abdomen and pelvis CT is recommended to exclude the presence of an early sigmoid volvulus. 6. Mild-to-moderate amount of air attenuation within the endometrial canal.  Correlation with pelvic ultrasound is recommended. 7. Bilateral simple renal cysts. 8. Aortic atherosclerosis.  Electronically Signed   By: Suzen Dials M.D.   On: 07/17/2024 03:40 CT Head Wo Contrast EXAM: CT HEAD WITHOUT CONTRAST 07/17/2024 02:43:04 AM  TECHNIQUE: CT of the head was performed without the administration of intravenous contrast. Automated exposure control, iterative reconstruction, and/or weight based adjustment of the mA/kV was utilized to reduce the radiation dose to as low  as reasonably achievable.  COMPARISON: None available.  CLINICAL HISTORY: Neuro deficit, acute, stroke suspected  FINDINGS:  BRAIN AND VENTRICLES: No acute hemorrhage. No evidence of acute large vascular territory infarct. Advanced patchy white matter hypodensities, compatible with chronic microvascular ischemic change. No hydrocephalus. No extra-axial collection. Stable 1 cm left posterior frontal lobe calcified extra-axial mass without mass effect, compatible with meningioma.  ORBITS: No acute abnormality.  SINUSES: No acute abnormality.  SOFT TISSUES AND SKULL: No skull fracture.  IMPRESSION: 1. No evidence of acute large vascular territory infarct. 2. Advanced chronic microvascular ischemic change. A white matter infarct could easily be obscured. Recommend low threshold for MRI. 3. Stable 1 cm left posterior frontal lobe meningioma without mass effect.  Electronically signed by: Gilmore Molt MD 07/17/2024 03:11 AM EST RP Workstation: [id number]  Lab Results  Component Value Date   WBC 11.9 (H) 07/16/2024   HGB 14.1 07/16/2024   HCT 44.9 07/16/2024   MCV 88.9 07/16/2024   PLT 296 07/16/2024   Last metabolic panel Lab Results  Component Value Date   GLUCOSE 135 (H) 07/16/2024   NA 137 07/16/2024   K 3.8 07/16/2024   CL 100 07/16/2024   CO2 26 07/16/2024   BUN 15 07/16/2024   CREATININE 1.33 (H) 07/16/2024   GFRNONAA 40 (L) 07/16/2024   CALCIUM  9.4  07/16/2024   PHOS 2.9 07/20/2023   PROT 7.4 07/16/2024   ALBUMIN  3.7 07/16/2024   LABGLOB 3.7 01/02/2020   AGRATIO 1.1 (L) 01/02/2020   BILITOT 0.6 07/16/2024   ALKPHOS 76 07/16/2024   AST 17 07/16/2024   ALT 14 07/16/2024   ANIONGAP 11 07/16/2024    Assessment and Plan: * Abdominal pain Internal hernia Noted progressive onset of abdominal pain over the past 12 hours with associated nausea and vomiting as well as increased bowel movements CT imaging today concerning for possible inguinal hernia versus early sigmoid volvulus General Surgery consulted with recommendation for GI evaluation for possible flex sigmoidoscopy Will make patient n.p.o. in the interim Follow-up GI and general surgery recommendations  CKD (chronic kidney disease), stage III (HCC) Creatinine 1.33 with GFR in the 40s Looks to be near baseline versus mildly dry Gentle IV fluids as indicated Monitor  PVD (peripheral vascular disease) Noted baseline peripheral vascular disease with history of femoropopliteal bypass as well as occlusion status post thrombectomy Neurovascularly intact distally Continue home regimen including aspirin  and Zetia  Monitor  Debility Baseline poor functional status with inability to ambulate status post prior CVA with right-sided deficits Looks to be near functional baseline Monitor Anticipate placement at discharge  History of CVA (cerebrovascular accident) Noted prior history of CVA 02/2023  and readmission for similar symptoms 07/2023 in the [facility] system with recrudescence of stroke symptoms thought to be secondary to UTI.  Chronic R sided hemiparesis and inability to fully ambulation Looks to be near baseline  Continue home regimen including aspirin  and statin  HTN (hypertension) BP stable  Titrate home regimen    Depression Stable Continue Cymbalta       Advance Care Planning:   Code Status: Prior   Consults: General surgery, GI   Family Communication: No family  at the bedside   Severity of Illness: The appropriate patient status for this patient is OBSERVATION. Observation status is judged to be reasonable and necessary in order to provide the required intensity of service to ensure the patient's safety. The patient's presenting symptoms, physical exam findings, and initial radiographic and laboratory data in the context of their medical  condition is felt to place them at decreased risk for further clinical deterioration. Furthermore, it is anticipated that the patient will be medically stable for discharge from the hospital within 2 midnights of admission.   Author: Elspeth JINNY Masters, MD 07/17/2024 8:33 AM  For on call review [email or web address].

## 2024-07-17 NOTE — Assessment & Plan Note (Signed)
Stable.  Continue Cymbalta. 

## 2024-07-17 NOTE — ED Notes (Signed)
Pt transported to ENDO 

## 2024-07-17 NOTE — ED Notes (Signed)
Pt returned from endoscopy.

## 2024-07-17 NOTE — Consult Note (Signed)
 CC/Reason for consult: ?Sigmoid volvulus  Requesting physician: Almarie Burner, MD  HPI: [name] is an 78 y.o. female with multiple medical problems including PAD, HTN, chronic constipation who presented to the emergency department via EMS no overnight hours ago for evaluation of recent abdominal pain and concerns that she was having a stroke.  She reports that she had some sloppy Joe's and following this had significant abdominal cramps.  Just prior to being picked up by EMS she reports that she had a large amount of loose watery stool with subsequent resolution of her abdominal symptoms.  Reports she has never had this kind of episode before.  Currently, reports her symptoms have essentially all resolved.  She denies any abdominal pain at present.  She denies any evident blood in her stool.  She reports it has been many years since she had a colonoscopy but has been told she has a large redundant/tortuous colon.  She is currently here by herself.  Past Medical History:  Diagnosis Date   AAA (abdominal aortic aneurysm)    3.1 cm 07/08/18, 3 year follow-up recommended; possible 3 cm AAA by aortogram 09/13/18   Anemia    PMH   Appendicitis with abscess    07/08/18, s/p perc drain; resolved 07/30/18 by CT   Arthritis    Cerebrovascular disease    intra and extracranial vascular dx per MRI 4/11, neurology rec strict CVRF control   Colonic inertia    Constipation    chronic;severe   Depression    Duodenitis    Gastritis    GERD (gastroesophageal reflux disease)    Hypertension    Hypothyroid 01/16/2014   Meningioma (HCC) 10/21/2013   PAD (peripheral artery disease)    Peripheral arterial disease    Psoriasis    sees derm   Wears dentures    Wears glasses     Past Surgical History:  Procedure Laterality Date   ABDOMINAL AORTOGRAM W/LOWER EXTREMITY N/A 09/13/2018   Procedure: ABDOMINAL AORTOGRAM W/LOWER EXTREMITY;  Surgeon: Harvey Carlin BRAVO, MD;  Location: [facility] INVASIVE  CV LAB;  Service: Cardiovascular;  Laterality: N/A;   ABDOMINAL AORTOGRAM W/LOWER EXTREMITY N/A 07/17/2023   Procedure: ABDOMINAL AORTOGRAM W/LOWER EXTREMITY;  Surgeon: Serene Gaile ORN, MD;  Location: [facility] INVAS[facility] CV LAB;  Service: Cardiovascular;  Laterality: N/A;   arthroscopy  04/2010   Right knee   CATARACT EXTRACTION W/ INTRAOCULAR LENS  IMPLANT, BILATERAL     COLONOSCOPY     FEMORAL-POPLITEAL BYPASS GRAFT  10/22/2018   FEMORAL-POPLITEAL BYPASS GRAFT Left 10/22/2018   Procedure: LEFT FEMORAL TO BELOW THE KNEE POPLITEAL ARTERY BYPASS GRAFT;  Surgeon: Harvey Carlin BRAVO, MD;  Location: [facility] OR;  Service: Vascular;  Laterality: Left;   I & D EXTREMITY Left 11/08/2018   Procedure: IRRIGATION AND DEBRIDEMENT EXTREMITY Left Leg;  Surgeon: Harvey Carlin BRAVO, MD;  Location: [facility] OR;  Service: Vascular;  Laterality: Left;   IR RADIOLOGIST EVAL & MGMT  07/30/2018   MULTIPLE TOOTH EXTRACTIONS     PERIPHERAL VASCULAR THROMBECTOMY N/A 07/18/2023   Procedure: LYSIS RECHECK;  Surgeon: Lanis Fonda BRAVO, MD;  Location: [facility] INVASIVE CV LAB;  Service: Cardiovascular;  Laterality: N/A;   TUBAL LIGATION      Family History  Problem Relation Age of Onset   Breast cancer Mother        metastisis to bones   Cancer Sister 38       spinal   Alcohol abuse Brother    Heart disease Brother  Lung cancer Brother        smoked   Diabetes Son    Heart disease Maternal Aunt    Heart disease Other        grandfather    Colon cancer Neg Hx    Esophageal cancer Neg Hx    Stomach cancer Neg Hx     Social:  reports that she has been smoking cigarettes. She started smoking about 64 years ago. She has a 30 pack-year smoking history. She has never used smokeless tobacco. She reports current alcohol use. She reports that she does not use drugs.  Allergies:  Allergies  Allergen Reactions   Lipitor [Atorvastatin ] Other (See Comments)    Self reported myalgias   Pravastatin Other (See Comments)    myalgias    Rosuvastatin  Other (See Comments)    Self reported myalgias    Medications: I have reviewed the patient's current medications.  Results for orders placed or performed during the hospital encounter of 07/16/24 (from the past 48 hours)  Lipase, blood     Status: None   Collection Time: 07/16/24 10:05 PM  Result Value Ref Range   Lipase 31 11 - 51 U/L    Comment: Performed at [facility] Lab, [address] [place], [place] [address]  Comprehensive metabolic panel     Status: Abnormal   Collection Time: 07/16/24 10:05 PM  Result Value Ref Range   Sodium 137 135 - 145 mmol/L   Potassium 3.8 3.5 - 5.1 mmol/L   Chloride 100 98 - 111 mmol/L   CO2 26 22 - 32 mmol/L   Glucose, Bld 135 (H) 70 - 99 mg/dL    Comment: Glucose reference range applies only to samples taken after fasting for at least 8 hours.   BUN 15 8 - 23 mg/dL   Creatinine, Ser 8.66 (H) 0.44 - 1.00 mg/dL   Calcium  9.4 8.9 - 10.3 mg/dL   Total Protein 7.4 6.5 - 8.1 g/dL   Albumin  3.7 3.5 - 5.0 g/dL   AST 17 15 - 41 U/L   ALT 14 0 - 44 U/L   Alkaline Phosphatase 76 38 - 126 U/L   Total Bilirubin 0.6 0.0 - 1.2 mg/dL   GFR, Estimated 40 (L) >60 mL/min    Comment: (NOTE) Calculated using the CKD-EPI Creatinine Equation (2021)    Anion gap 11 5 - 15    Comment: Performed at [facility], [address] [place], [place] [address]  CBC     Status: Abnormal   Collection Time: 07/16/24 10:05 PM  Result Value Ref Range   WBC 11.9 (H) 4.0 - 10.5 K/uL   RBC 5.05 3.87 - 5.11 MIL/uL   Hemoglobin 14.1 12.0 - 15.0 g/dL   HCT 55.0 63.9 - 53.9 %   MCV 88.9 80.0 - 100.0 fL   MCH 27.9 26.0 - 34.0 pg   MCHC 31.4 30.0 - 36.0 g/dL   RDW 85.3 88.4 - 84.4 %   Platelets 296 150 - 400 K/uL   nRBC 0.0 0.0 - 0.2 %    Comment: Performed at [facility] Lab, [address] [place], [place] [address]  Troponin I (High Sensitivity)     Status: None   Collection Time: 07/17/24  1:17 AM  Result Value Ref Range   Troponin I (High  Sensitivity) 11 <18 ng/L    Comment: (NOTE) Elevated high sensitivity troponin I (hsTnI) values and significant  changes across serial measurements may suggest  ACS but many other  chronic and acute conditions are known to elevate hsTnI results.  Refer to the Links section for chest pain algorithms and additional  guidance. Performed at [facility] Lab, [address] [place], [place] [address]   Troponin I (High Sensitivity)     Status: None   Collection Time: 07/17/24  3:18 AM  Result Value Ref Range   Troponin I (High Sensitivity) 12 <18 ng/L    Comment: (NOTE) Elevated high sensitivity troponin I (hsTnI) values and significant  changes across serial measurements may suggest ACS but many other  chronic and acute conditions are known to elevate hsTnI results.  Refer to the Links section for chest pain algorithms and additional  guidance. Performed at [facility] Lab, [address], [place], [place] [address]     CT Angio Chest/Abd/Pel for Dissection W and/or W/WO Result Date: 07/17/2024 CLINICAL DATA:  Abdominal pain with nausea and vomiting. EXAM: CT ANGIOGRAPHY CHEST, ABDOMEN AND PELVIS TECHNIQUE: Non-contrast CT of the chest was initially obtained. Multidetector CT imaging through the chest, abdomen and pelvis was performed using the standard protocol during bolus administration of intravenous contrast. Multiplanar reconstructed images and MIPs were obtained and reviewed to evaluate the vascular anatomy. RADIATION DOSE REDUCTION: This exam was performed according to the departmental dose-optimization program which includes automated exposure control, adjustment of the mA and/or kV according to patient size and/or use of iterative reconstruction technique. CONTRAST:  OMNIPAQUE  IOHEXOL  350 MG/ML SOLN COMPARISON:  October 02, 2023 FINDINGS: CTA CHEST FINDINGS Cardiovascular: There is marked severity calcification of the thoracic aorta. The ascending thoracic aorta  measures 3.2 cm in diameter, while the descending thoracic aorta measures approximately 3.7 cm. Satisfactory opacification of the pulmonary arteries to the segmental level. No evidence of pulmonary embolism. There is mild cardiomegaly. No pericardial effusion. Mediastinum/Nodes: No enlarged mediastinal, hilar, or axillary lymph nodes. Thyroid  gland, trachea, and esophagus demonstrate no significant findings. Lungs/Pleura: There is evidence of extensive emphysematous lung disease. Moderate severity areas of scarring, fibrosis and atelectasis are seen within the bilateral lung bases and posterior aspect of the right upper lobe. No pleural effusion or pneumothorax is identified. Musculoskeletal: Multilevel degenerative changes are seen throughout the thoracic spine. Review of the MIP images confirms the above findings. CTA ABDOMEN AND PELVIS FINDINGS VASCULAR Aorta: There is extensive aortic atherosclerosis with tortuosity and 3.5 cm x 3.2 cm aneurysmal dilatation of the infrarenal abdominal aorta. There is no evidence of dissection, vasculitis or significant stenosis. Celiac and SMA: A shared origin of the celiac and superior mesenteric arteries is suspected, as separate origins for each cannot be identified. There is no evidence of aneurysmal dilatation, dissection, vasculitis or significant stenosis. Renals: Both renal arteries are patent, with marked severity narrowing and suspected hemodynamically significant stenosis of the origin of the right renal artery. There is no evidence of renal artery aneurysm, dissection, vasculitis, fibromuscular dysplasia. IMA: Patent without evidence of aneurysm, dissection, vasculitis or significant stenosis. Inflow: Patent with extensive atherosclerotic disease. No evidence of aneurysm, dissection, vasculitis or significant stenosis. A left common femoral artery stent is noted. Veins: No obvious venous abnormality within the limitations of this arterial phase study. Review of the  MIP images confirms the above findings. NON-VASCULAR Hepatobiliary: No focal liver abnormality is seen. The gallbladder is contracted without evidence of gallstones, gallbladder wall thickening, or biliary dilatation. Pancreas: Unremarkable. No pancreatic ductal dilatation or surrounding inflammatory changes. Spleen: Normal in size without focal abnormality. Adrenals/Urinary Tract: There is mild  diffuse enlargement of the left adrenal gland. The right adrenal gland is unremarkable. Kidneys are normal in size, without renal calculi or hydronephrosis. A 2.7 cm simple cyst is seen within the lower pole of the left kidney. A 1.2 cm simple cyst is noted within the medial aspect of the mid right kidney. Bladder is unremarkable. Stomach/Bowel: Stomach is within normal limits. Appendix appears normal. No evidence of bowel dilatation. An area of mesenteric twisting is seen within the medial aspect of the mid to lower left abdomen (axial CT images 210 through 249, CT series 7). As a result, moderately thickened and poorly distended loops of sigmoid colon are seen within the abdomen on the right. Lymphatic: No abnormal abdominal or pelvic lymph nodes are identified. Reproductive: A mild-to-moderate amount of air attenuation is seen within the endometrial canal (axial CT images 242 through 254, CT series 7. The bilateral adnexa are unremarkable. Other: No abdominal wall hernia or abnormality. No abdominopelvic ascites. Musculoskeletal: Multilevel degenerative changes seen throughout the lumbar spine. Review of the MIP images confirms the above findings. IMPRESSION: 1. No evidence of pulmonary embolism. 2. 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 3. Extensive aortic atherosclerosis with tortuosity and 3.5 cm x 3.2 cm aneurysmal dilatation of the infrarenal abdominal aorta. 4. Marked severity narrowing and suspected hemodynamically significant stenosis  of the origin of the right renal artery. 5. Area of mesenteric twisting within the medial aspect of the mid to lower left abdomen, with resultant inflamed loops of sigmoid colon noted within the abdomen on the right. While this may represent an internal hernia, follow-up with abdomen and pelvis CT is recommended to exclude the presence of an early sigmoid volvulus. 6. Mild-to-moderate amount of air attenuation within the endometrial canal. Correlation with pelvic ultrasound is recommended. 7. Bilateral simple renal cysts. 8. Aortic atherosclerosis. Electronically Signed   By: Suzen Dials M.D.   On: 07/17/2024 03:40   CT Head Wo Contrast Result Date: 07/17/2024 EXAM: CT HEAD WITHOUT CONTRAST 07/17/2024 02:43:04 AM TECHNIQUE: CT of the head was performed without the administration of intravenous contrast. Automated exposure control, iterative reconstruction, and/or weight based adjustment of the mA/kV was utilized to reduce the radiation dose to as low as reasonably achievable. COMPARISON: None available. CLINICAL HISTORY: Neuro deficit, acute, stroke suspected FINDINGS: BRAIN AND VENTRICLES: No acute hemorrhage. No evidence of acute large vascular territory infarct. Advanced patchy Danija Gosa matter hypodensities, compatible with chronic microvascular ischemic change. No hydrocephalus. No extra-axial collection. Stable 1 cm left posterior frontal lobe calcified extra-axial mass without mass effect, compatible with meningioma. ORBITS: No acute abnormality. SINUSES: No acute abnormality. SOFT TISSUES AND SKULL: No skull fracture. IMPRESSION: 1. No evidence of acute large vascular territory infarct. 2. Advanced chronic microvascular ischemic change. A Nicholis Stepanek matter infarct could easily be obscured. Recommend low threshold for MRI. 3. Stable 1 cm left posterior frontal lobe meningioma without mass effect. Electronically signed by: Gilmore Molt MD 07/17/2024 03:11 AM EST RP Workstation: [id number]    ROS - all of  the below systems have been reviewed with the patient and positives are indicated with bold text General: chills, fever or night sweats Eyes: blurry vision or double vision ENT: epistaxis or sore throat Allergy/Immunology: itchy/watery eyes or nasal congestion Hematologic/Lymphatic: bleeding problems, blood clots or swollen lymph nodes Endocrine: temperature intolerance or unexpected weight changes Breast: new or changing breast lumps or nipple discharge Resp: cough, shortness of breath, or wheezing CV: chest pain or  dyspnea on exertion GI: as per HPI GU: dysuria, trouble voiding, or hematuria MSK: joint pain or joint stiffness Neuro: TIA or stroke symptoms Derm: pruritus and skin lesion changes Psych: anxiety and depression  PE Blood pressure (!) 150/79, pulse 80, temperature 97.6 F (36.4 C), temperature source Oral, resp. rate 16, SpO2 100%. Constitutional: NAD; conversant; no deformities Eyes: Moist conjunctiva; no lid lag; anicteric; PERRL Neck: Trachea midline; no thyromegaly Lungs: Normal respiratory effort; no tactile fremitus CV: RRR; no palpable thrills; no pitting edema GI: Abd soft, nontender, not significantly distended MSK: Normal range of motion of extremities; no clubbing/cyanosis Psychiatric: Appropriate affect; alert and oriented x3 Lymphatic: No palpable cervical or axillary lymphadenopathy  Results for orders placed or performed during the hospital encounter of 07/16/24 (from the past 48 hours)  Lipase, blood     Status: None   Collection Time: 07/16/24 10:05 PM  Result Value Ref Range   Lipase 31 11 - 51 U/L    Comment: Performed at [facility], [address], [place], [place] [address]  Comprehensive metabolic panel     Status: Abnormal   Collection Time: 07/16/24 10:05 PM  Result Value Ref Range   Sodium 137 135 - 145 mmol/L   Potassium 3.8 3.5 - 5.1 mmol/L   Chloride 100 98 - 111 mmol/L   CO2 26 22 - 32 mmol/L   Glucose, Bld 135 (H) 70 - 99  mg/dL    Comment: Glucose reference range applies only to samples taken after fasting for at least 8 hours.   BUN 15 8 - 23 mg/dL   Creatinine, Ser 8.66 (H) 0.44 - 1.00 mg/dL   Calcium  9.4 8.9 - 10.3 mg/dL   Total Protein 7.4 6.5 - 8.1 g/dL   Albumin  3.7 3.5 - 5.0 g/dL   AST 17 15 - 41 U/L   ALT 14 0 - 44 U/L   Alkaline Phosphatase 76 38 - 126 U/L   Total Bilirubin 0.6 0.0 - 1.2 mg/dL   GFR, Estimated 40 (L) >60 mL/min    Comment: (NOTE) Calculated using the CKD-EPI Creatinine Equation (2021)    Anion gap 11 5 - 15    Comment: Performed at [facility], [address] [place], [place] [address]  CBC     Status: Abnormal   Collection Time: 07/16/24 10:05 PM  Result Value Ref Range   WBC 11.9 (H) 4.0 - 10.5 K/uL   RBC 5.05 3.87 - 5.11 MIL/uL   Hemoglobin 14.1 12.0 - 15.0 g/dL   HCT 55.0 63.9 - 53.9 %   MCV 88.9 80.0 - 100.0 fL   MCH 27.9 26.0 - 34.0 pg   MCHC 31.4 30.0 - 36.0 g/dL   RDW 85.3 88.4 - 84.4 %   Platelets 296 150 - 400 K/uL   nRBC 0.0 0.0 - 0.2 %    Comment: Performed at [facility], [address], [place], [place] [address]  Troponin I (High Sensitivity)     Status: None   Collection Time: 07/17/24  1:17 AM  Result Value Ref Range   Troponin I (High Sensitivity) 11 <18 ng/L    Comment: (NOTE) Elevated high sensitivity troponin I (hsTnI) values and significant  changes across serial measurements may suggest ACS but many other  chronic and acute conditions are known to elevate hsTnI results.  Refer to the Links section for chest pain algorithms and additional  guidance. Performed at [facility], [address]  [place], [place] [address]   Troponin I (High Sensitivity)     Status: None   Collection Time: 07/17/24  3:18 AM  Result Value Ref Range   Troponin I (High Sensitivity) 12 <18 ng/L    Comment: (NOTE) Elevated high sensitivity troponin I (hsTnI) values and significant  changes across serial measurements may suggest ACS but many  other  chronic and acute conditions are known to elevate hsTnI results.  Refer to the Links section for chest pain algorithms and additional  guidance. Performed at [facility], [address], [place], [place] [address]     CT Angio Chest/Abd/Pel for Dissection W and/or W/WO Result Date: 07/17/2024 CLINICAL DATA:  Abdominal pain with nausea and vomiting. EXAM: CT ANGIOGRAPHY CHEST, ABDOMEN AND PELVIS TECHNIQUE: Non-contrast CT of the chest was initially obtained. Multidetector CT imaging through the chest, abdomen and pelvis was performed using the standard protocol during bolus administration of intravenous contrast. Multiplanar reconstructed images and MIPs were obtained and reviewed to evaluate the vascular anatomy. RADIATION DOSE REDUCTION: This exam was performed according to the departmental dose-optimization program which includes automated exposure control, adjustment of the mA and/or kV according to patient size and/or use of iterative reconstruction technique. CONTRAST:  OMNIPAQUE  IOHEXOL  350 MG/ML SOLN COMPARISON:  October 02, 2023 FINDINGS: CTA CHEST FINDINGS Cardiovascular: There is marked severity calcification of the thoracic aorta. The ascending thoracic aorta measures 3.2 cm in diameter, while the descending thoracic aorta measures approximately 3.7 cm. Satisfactory opacification of the pulmonary arteries to the segmental level. No evidence of pulmonary embolism. There is mild cardiomegaly. No pericardial effusion. Mediastinum/Nodes: No enlarged mediastinal, hilar, or axillary lymph nodes. Thyroid  gland, trachea, and esophagus demonstrate no significant findings. Lungs/Pleura: There is evidence of extensive emphysematous lung disease. Moderate severity areas of scarring, fibrosis and atelectasis are seen within the bilateral lung bases and posterior aspect of the right upper lobe. No pleural effusion or pneumothorax is identified. Musculoskeletal: Multilevel degenerative  changes are seen throughout the thoracic spine. Review of the MIP images confirms the above findings. CTA ABDOMEN AND PELVIS FINDINGS VASCULAR Aorta: There is extensive aortic atherosclerosis with tortuosity and 3.5 cm x 3.2 cm aneurysmal dilatation of the infrarenal abdominal aorta. There is no evidence of dissection, vasculitis or significant stenosis. Celiac and SMA: A shared origin of the celiac and superior mesenteric arteries is suspected, as separate origins for each cannot be identified. There is no evidence of aneurysmal dilatation, dissection, vasculitis or significant stenosis. Renals: Both renal arteries are patent, with marked severity narrowing and suspected hemodynamically significant stenosis of the origin of the right renal artery. There is no evidence of renal artery aneurysm, dissection, vasculitis, fibromuscular dysplasia. IMA: Patent without evidence of aneurysm, dissection, vasculitis or significant stenosis. Inflow: Patent with extensive atherosclerotic disease. No evidence of aneurysm, dissection, vasculitis or significant stenosis. A left common femoral artery stent is noted. Veins: No obvious venous abnormality within the limitations of this arterial phase study. Review of the MIP images confirms the above findings. NON-VASCULAR Hepatobiliary: No focal liver abnormality is seen. The gallbladder is contracted without evidence of gallstones, gallbladder wall thickening, or biliary dilatation. Pancreas: Unremarkable. No pancreatic ductal dilatation or surrounding inflammatory changes. Spleen: Normal in size without focal abnormality. Adrenals/Urinary Tract: There is mild diffuse enlargement of the left adrenal gland. The right adrenal gland is unremarkable. Kidneys are normal in size, without renal calculi or hydronephrosis. A 2.7 cm simple cyst is seen within the lower pole of the left kidney. A  1.2 cm simple cyst is noted within the medial aspect of the mid right kidney. Bladder is  unremarkable. Stomach/Bowel: Stomach is within normal limits. Appendix appears normal. No evidence of bowel dilatation. An area of mesenteric twisting is seen within the medial aspect of the mid to lower left abdomen (axial CT images 210 through 249, CT series 7). As a result, moderately thickened and poorly distended loops of sigmoid colon are seen within the abdomen on the right. Lymphatic: No abnormal abdominal or pelvic lymph nodes are identified. Reproductive: A mild-to-moderate amount of air attenuation is seen within the endometrial canal (axial CT images 242 through 254, CT series 7. The bilateral adnexa are unremarkable. Other: No abdominal wall hernia or abnormality. No abdominopelvic ascites. Musculoskeletal: Multilevel degenerative changes seen throughout the lumbar spine. Review of the MIP images confirms the above findings. IMPRESSION: 1. No evidence of pulmonary embolism. 2. 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 3. Extensive aortic atherosclerosis with tortuosity and 3.5 cm x 3.2 cm aneurysmal dilatation of the infrarenal abdominal aorta. 4. Marked severity narrowing and suspected hemodynamically significant stenosis of the origin of the right renal artery. 5. Area of mesenteric twisting within the medial aspect of the mid to lower left abdomen, with resultant inflamed loops of sigmoid colon noted within the abdomen on the right. While this may represent an internal hernia, follow-up with abdomen and pelvis CT is recommended to exclude the presence of an early sigmoid volvulus. 6. Mild-to-moderate amount of air attenuation within the endometrial canal. Correlation with pelvic ultrasound is recommended. 7. Bilateral simple renal cysts. 8. Aortic atherosclerosis. Electronically Signed   By: Suzen Dials M.D.   On: 07/17/2024 03:40   CT Head Wo Contrast Result Date: 07/17/2024 EXAM: CT HEAD WITHOUT CONTRAST 07/17/2024  02:43:04 AM TECHNIQUE: CT of the head was performed without the administration of intravenous contrast. Automated exposure control, iterative reconstruction, and/or weight based adjustment of the mA/kV was utilized to reduce the radiation dose to as low as reasonably achievable. COMPARISON: None available. CLINICAL HISTORY: Neuro deficit, acute, stroke suspected FINDINGS: BRAIN AND VENTRICLES: No acute hemorrhage. No evidence of acute large vascular territory infarct. Advanced patchy Kariyah Baugh matter hypodensities, compatible with chronic microvascular ischemic change. No hydrocephalus. No extra-axial collection. Stable 1 cm left posterior frontal lobe calcified extra-axial mass without mass effect, compatible with meningioma. ORBITS: No acute abnormality. SINUSES: No acute abnormality. SOFT TISSUES AND SKULL: No skull fracture. IMPRESSION: 1. No evidence of acute large vascular territory infarct. 2. Advanced chronic microvascular ischemic change. A Tysheem Accardo matter infarct could easily be obscured. Recommend low threshold for MRI. 3. Stable 1 cm left posterior frontal lobe meningioma without mass effect. Electronically signed by: Gilmore Molt MD 07/17/2024 03:11 AM EST RP Workstation: [id number]    A/P: [name] is an 78 y.o. female with multiple medical problems including PAD, HTN, chronic constipation here for evaluation of now resolved abdominal pain   CTA demonstrated from GI perspective area of potential mesenteric twisting of the medial aspect of the mid to lower sigmoid with resultant inflamed loops of sigmoid.  Correlating this with her symptoms and resolution of pain after a large loose bowel movement, I suspect that she recently had a sigmoid volvulus.  - Would recommend GI consultation for at least consideration of flexible sigmoidoscopy. -Discussed all the above with her.  Discussed potential workup for further evaluation.  All of her questions were answered to her satisfaction, she expressed  understanding, and agreement to plan. - Will continue to follow with you.  I have updated her admitting hospitalist on this plan as well.  I spent a total of 80 minutes in both face-to-face and non-face-to-face activities, excluding procedures performed, for this visit on the date of this encounter.  Lonni Pizza, MD [facility], [facility]

## 2024-07-17 NOTE — Care Management Obs Status (Signed)
 MEDICARE OBSERVATION STATUS NOTIFICATION   Patient Details  Name: [name] MRN: [id number] Date of Birth: 1946/02/28   Medicare Observation Status Notification Given:  Yes    Sie Formisano Crawford 07/17/2024, 4:37 PM

## 2024-07-17 NOTE — Progress Notes (Signed)
 Transition of Care [facility]) - Inpatient Brief Assessment   Patient Details  Name: [name] MRN: [id number] Date of Birth: [birth year]  Transition of Care [facility] CM/SW Contact:    Rosaline JONELLE Joe, RN Phone Number: 07/17/2024, 3:31 PM   Clinical Narrative: Patient admitted to the hospital from home with abdominal pain with possible internal hernia.  Patient is history of CVA and is nonambulatory at baseline.  Patient had GI consult and flexible sigmoidoscopy completed today.  PT/OT pending at this time.  IP Care management team will continue to follow the patient as she progresses.   Transition of Care Asessment: Insurance and Status: (P) Insurance coverage has been reviewed Patient has primary care physician: (P) Yes Home environment has been reviewed: (P) from home   Prior/Current Home Services: (P) No current home services Social Drivers of Health Review: (P) SDOH reviewed needs interventions Readmission risk has been reviewed: (P) Yes Transition of care needs: (P) transition of care needs identified, TOC will continue to follow

## 2024-07-17 NOTE — Assessment & Plan Note (Signed)
 Baseline poor functional status with inability to ambulate status post prior CVA with right-sided deficits Looks to be near functional baseline Monitor Anticipate placement at discharge

## 2024-07-17 NOTE — Assessment & Plan Note (Addendum)
 Noted prior history of CVA 02/2023  and readmission for similar symptoms 07/2023 in the [facility] system with recrudescence of stroke symptoms thought to be secondary to UTI.  Chronic R sided hemiparesis and inability to fully ambulation Looks to be near baseline  Continue home regimen including aspirin  and statin

## 2024-07-17 NOTE — Assessment & Plan Note (Addendum)
 Internal hernia Noted progressive onset of abdominal pain over the past 12 hours with associated nausea and vomiting as well as increased bowel movements CT imaging today concerning for possible inguinal hernia versus early sigmoid volvulus General Surgery consulted with recommendation for GI evaluation for possible flex sigmoidoscopy Will make patient n.p.o. in the interim Follow-up GI and general surgery recommendations

## 2024-07-17 NOTE — Anesthesia Preprocedure Evaluation (Addendum)
 Anesthesia Evaluation  Patient identified by MRN, date of birth, ID band Patient awake    Reviewed: Allergy & Precautions, NPO status , Patient's Chart, lab work & pertinent test results  History of Anesthesia Complications Negative for: history of anesthetic complications  Airway Mallampati: II  TM Distance: >3 FB Neck ROM: Full    Dental  (+) Teeth Intact, Dental Advisory Given   Pulmonary COPD, Current Smoker and Patient abstained from smoking.   breath sounds clear to auscultation       Cardiovascular hypertension, Pt. on medications + Peripheral Vascular Disease   Rhythm:Regular Rate:Normal   '24 TTE - EF 70 to 75%. There is severe left ventricular hypertrophy. A small pericardial effusion is present. There is no evidence of cardiac tamponade. Trivial mitral valve regurgitation. Aortic valve regurgitation is trivial.     Neuro/Psych  Headaches PSYCHIATRIC DISORDERS Anxiety Depression     Meningioma  CVA    GI/Hepatic Neg liver ROS,GERD  ,, Sigmoid volvulus    Endo/Other  Hypothyroidism    Renal/GU CRFRenal disease     Musculoskeletal  (+) Arthritis ,    Abdominal   Peds  Hematology  On plavix     Anesthesia Other Findings   Reproductive/Obstetrics                              Anesthesia Physical Anesthesia Plan  ASA: 3  Anesthesia Plan: MAC   Post-op Pain Management: Minimal or no pain anticipated   Induction:   PONV Risk Score and Plan: 2 and Propofol  infusion and Treatment may vary due to age or medical condition  Airway Management Planned: Nasal Cannula and Natural Airway  Additional Equipment: None  Intra-op Plan:   Post-operative Plan:   Informed Consent: I have reviewed the patients History and Physical, chart, labs and discussed the procedure including the risks, benefits and alternatives for the proposed anesthesia with the patient or authorized  representative who has indicated his/her understanding and acceptance.     Consent reviewed with POA  Plan Discussed with: CRNA and Anesthesiologist  Anesthesia Plan Comments: (Attempted to call son, no answer. Will proceed due to suspected diagnosis and proceduralist request.)         Anesthesia Quick Evaluation

## 2024-07-17 NOTE — ED Notes (Signed)
 Pt refused breakfast tray.    Pt irritable d/t being in the hallway, time in department, and the stretcher being uncomfortable.  Pt reassured we are trying to get her to her IP room.  Also, educated that the Hospitalist will be coming to see her.

## 2024-07-17 NOTE — Op Note (Signed)
 [facility] Patient Name: [name] Procedure Date : 07/17/2024 MRN: [id number] Attending MD: Gordy CHRISTELLA Starch , MD, [id number] Date of Birth: Feb 03, 1946 CSN: [id number] Age: 78 Admit Type: Inpatient Procedure:                Flexible Sigmoidoscopy Indications:              Generalized abdominal pain, Abnormal CT of the GI                            tract suggestive of sigmoid volvulus versus                            internal hernia, lifelong chronic constipation Providers:                Gordy CHRISTELLA. Starch, MD, Ozell Pouch, Coye Bade, Technician Referring MD:             Lonni CHRISTELLA. White, MD Medicines:                Monitored Anesthesia Care Complications:            No immediate complications. Estimated Blood Loss:     Estimated blood loss: none. Procedure:                Pre-Anesthesia Assessment:                           - Prior to the procedure, a History and Physical                            was performed, and patient medications and                            allergies were reviewed. The patient's tolerance of                            previous anesthesia was also reviewed. The risks                            and benefits of the procedure and the sedation                            options and risks were discussed with the patient.                            All questions were answered, and informed consent                            was obtained. Prior Anticoagulants: The patient has                            taken Plavix  (clopidogrel ), last dose was 2 days  prior to procedure. ASA Grade Assessment: III - A                            patient with severe systemic disease. After                            reviewing the risks and benefits, the patient was                            deemed in satisfactory condition to undergo the                            procedure.                            After obtaining informed consent, the scope was                            passed under direct vision. The PCF-HQ190L                            ([id number]) Olympus colonoscope was introduced                            through the anus and advanced to the splenic                            flexure. The flexible sigmoidoscopy was                            accomplished without difficulty. The patient                            tolerated the procedure well. No bowel preparation                            was given prior to the procedure. The quality of                            visualization was adequate. Scope In: 12:31:42 PM Scope Out: 12:42:54 PM Total Procedure Duration: 0 hours 11 minutes 12 seconds  Findings:      The digital rectal exam was normal.      Segmental mild to moderate mucosal changes characterized by altered       vascularity, congestion (edema), erosions, erythema, granularity and       mucus were found in the sigmoid colon and in the descending colon. This       is a rather long-segment (longer than one would expect for volvulus       alone)      A 5 mm polyp was found in the distal sigmoid colon. The polyp was       sessile. Polypectomy was not attempted due to colitis and low risk       nature of this polyp.      The retroflexed view of the distal rectum and anal verge was normal  and       showed no anal or rectal abnormalities. Impression:               - Segmental mild to moderate mucosal changes were                            found in the sigmoid colon and in the descending                            colon most consistent with ischemic colitis. No                            stricture or active volvulus. Query ischemic                            colitis, though cannot exclude internal hernia                            causing now resolved, brief, obstruction and                            mucosal injury.                           - One 5 mm polyp in the distal  sigmoid colon.                            Resection not attempted. Very low risk lesion.                           - No specimens collected. Moderate Sedation:      N/A Recommendation:           - Return patient to hospital ward for ongoing care.                           - Aggressive laxative regimen in attempt to prevent                            constipation.                           - Supportive care.                           - GI will sign off, call if questions.                           - Discussed with patient and her son by phone                            post-procedure. Procedure Code(s):        --- Professional ---                           [phone], Sigmoidoscopy, flexible; diagnostic,  including collection of specimen(s) by brushing or                            washing, when performed (separate procedure) Diagnosis Code(s):        --- Professional ---                           K55.9, Vascular disorder of intestine, unspecified                           D12.5, Benign neoplasm of sigmoid colon                           R10.84, Generalized abdominal pain                           R93.3, Abnormal findings on diagnostic imaging of                            other parts of digestive tract CPT copyright 2022 [employer]. All rights reserved. The codes documented in this report are preliminary and upon coder review may  be revised to meet current compliance requirements. Gordy CHRISTELLA Starch, MD 07/17/2024 1:19:13 PM This report has been signed electronically. Number of Addenda: 0

## 2024-07-17 NOTE — Assessment & Plan Note (Signed)
 BP stable Titrate home regimen

## 2024-07-17 NOTE — ED Provider Notes (Signed)
  EMERGENCY DEPARTMENT AT [facility] Provider Note   CSN: [id number] Arrival date & time: 07/16/24  2132     Patient presents with: Abdominal Pain   [name] is a 78 y.o. female.   The history is provided by the patient.  Abdominal Pain [name] is a 78 y.o. female who presents to the Emergency Department complaining of abdominal pain.  She presents to the emergency department by EMS for evaluation of generalized abdominal pain and cramping that started a few hours prior to ED arrival.  She states that it may have been triggered by eating a greasy food, which she had not had in a very long time.  She had associated massive watery diarrhea that was nonbloody.  She does have nausea.  She reports ongoing issues with dysuria.  She did have transient chest pain that is now resolved.  She also felt like she was weak in both arms, which is resolved.  She does have a history of recurrent strokes and gets similar weakness intermittently.  Has a history of PAD, hypertension, CKD, AAA.     Prior to Admission medications   Medication Sig Start Date End Date Taking? Authorizing Provider  acetaminophen  (TYLENOL ) 325 MG tablet Take 2 tablets (650 mg total) by mouth every 6 (six) hours as needed for headache or mild pain (pain score 1-3). 07/23/23   Christobal Guadalajara, MD  albuterol  (VENTOLIN  HFA) 108 (90 Base) MCG/ACT inhaler Inhale 1-2 puffs into the lungs every 6 (six) hours as needed for wheezing or shortness of breath. 05/31/23   Levora Reyes SAUNDERS, MD  amLODipine  (NORVASC ) 5 MG tablet Take 1 tablet (5 mg total) by mouth daily. 02/07/23   Lavona Agent, MD  aspirin  EC 81 MG tablet Take 81 mg by mouth daily. Swallow whole.    [provider]  clonazePAM  (KLONOPIN ) 0.5 MG tablet Take 0.5 mg by mouth daily.    [provider]  clopidogrel  (PLAVIX ) 75 MG tablet Take 1 tablet (75 mg total) by mouth daily with breakfast. 07/24/23   Christobal Guadalajara, MD  DULoxetine   (CYMBALTA ) 30 MG capsule Take 1 capsule (30 mg total) by mouth daily. Patient taking differently: Take 60 mg by mouth daily. 10/12/23   Cottle, Lorene KANDICE Raddle., MD  ezetimibe  (ZETIA ) 10 MG tablet Take 1 tablet (10 mg total) by mouth daily. 07/24/23   Christobal Guadalajara, MD  linaclotide  (LINZESS ) 290 MCG CAPS capsule Take 1 capsule (290 mcg total) by mouth daily before breakfast. 07/23/23   Christobal Guadalajara, MD  losartan  (COZAAR ) 25 MG tablet Take 1 tablet (25 mg total) by mouth daily. 02/23/23 02/23/24  Lavona Agent, MD  polyethylene glycol (MIRALAX  / GLYCOLAX ) 17 g packet Take 34 g by mouth daily. 03/08/23   Tobie Yetta HERO, MD  PRESCRIPTION MEDICATION Take 1 capsule by mouth daily. Prokinetic. Unknown name and strength.    [provider]  rOPINIRole  (REQUIP ) 2 MG tablet TAKE 1 TABLET(2 MG) BY MOUTH AT BEDTIME Patient taking differently: Take 2 mg by mouth at bedtime. 03/30/23   Cottle, Lorene KANDICE Raddle., MD  Sennosides 25 MG TABS Take by mouth.    [provider]  simethicone  (MYLICON) 80 MG chewable tablet Chew 1 tablet (80 mg total) by mouth 4 (four) times daily. 03/07/23   Tobie Yetta HERO, MD  traMADol  (ULTRAM ) 50 MG tablet Take 1 tablet (50 mg total) by mouth every 6 (six) hours as needed for severe pain (pain score 7-10). 09/21/23  Neysa Caron PARAS, DO    Allergies: Lipitor [atorvastatin ], Pravastatin, and Rosuvastatin     Review of Systems  Gastrointestinal:  Positive for abdominal pain.  All other systems reviewed and are negative.   Updated Vital Signs BP (!) 150/79 (BP Location: Left Arm)   Pulse 80   Temp 97.6 F (36.4 C) (Oral)   Resp 16   SpO2 100%   Physical Exam Vitals and nursing note reviewed.  Constitutional:      Appearance: She is well-developed.  HENT:     Head: Normocephalic and atraumatic.  Cardiovascular:     Rate and Rhythm: Normal rate and regular rhythm.     Heart sounds: No murmur heard. Pulmonary:     Effort: Pulmonary effort is normal. No respiratory  distress.     Breath sounds: Normal breath sounds.  Abdominal:     Palpations: Abdomen is soft.     Tenderness: There is no guarding or rebound.     Comments: Mild generalized abdominal tenderness  Musculoskeletal:        General: No tenderness.     Comments: Cool feet bilaterally with delayed cap refill, right greater than left.  Patient states this is baseline for several years.  Skin:    General: Skin is warm and dry.  Neurological:     Mental Status: She is alert and oriented to person, place, and time.     Comments: Mild right facial droop.  She does have drift in the right upper extremity, right lower extremity, which she states is chronic.  5 out of 5 strength in the left upper and left lower extremity  Psychiatric:        Behavior: Behavior normal.     (all labs ordered are listed, but only abnormal results are displayed) Labs Reviewed  COMPREHENSIVE METABOLIC PANEL WITH GFR - Abnormal; Notable for the following components:      Result Value   Glucose, Bld 135 (*)    Creatinine, Ser 1.33 (*)    GFR, Estimated 40 (*)    All other components within normal limits  CBC - Abnormal; Notable for the following components:   WBC 11.9 (*)    All other components within normal limits  LIPASE, BLOOD  URINALYSIS, W/ REFLEX TO CULTURE (INFECTION SUSPECTED)  TROPONIN I (HIGH SENSITIVITY)  TROPONIN I (HIGH SENSITIVITY)    EKG: EKG Interpretation Date/Time:  Thursday July 17 2024 02:10:03 EST Ventricular Rate:  81 PR Interval:  186 QRS Duration:  94 QT Interval:  348 QTC Calculation: 404 R Axis:   41  Text Interpretation: Normal sinus rhythm Biatrial enlargement Anterior infarct , age undetermined Abnormal ECG Confirmed by Griselda Norris [phone]) on 07/17/2024 2:29:56 AM  Radiology: CT Angio Chest/Abd/Pel for Dissection W and/or W/WO Result Date: 07/17/2024 CLINICAL DATA:  Abdominal pain with nausea and vomiting. EXAM: CT ANGIOGRAPHY CHEST, ABDOMEN AND PELVIS TECHNIQUE:  Non-contrast CT of the chest was initially obtained. Multidetector CT imaging through the chest, abdomen and pelvis was performed using the standard protocol during bolus administration of intravenous contrast. Multiplanar reconstructed images and MIPs were obtained and reviewed to evaluate the vascular anatomy. RADIATION DOSE REDUCTION: This exam was performed according to the departmental dose-optimization program which includes automated exposure control, adjustment of the mA and/or kV according to patient size and/or use of iterative reconstruction technique. CONTRAST:  OMNIPAQUE  IOHEXOL  350 MG/ML SOLN COMPARISON:  October 02, 2023 FINDINGS: CTA CHEST FINDINGS Cardiovascular: There is marked severity calcification of the thoracic aorta. The ascending  thoracic aorta measures 3.2 cm in diameter, while the descending thoracic aorta measures approximately 3.7 cm. Satisfactory opacification of the pulmonary arteries to the segmental level. No evidence of pulmonary embolism. There is mild cardiomegaly. No pericardial effusion. Mediastinum/Nodes: No enlarged mediastinal, hilar, or axillary lymph nodes. Thyroid  gland, trachea, and esophagus demonstrate no significant findings. Lungs/Pleura: There is evidence of extensive emphysematous lung disease. Moderate severity areas of scarring, fibrosis and atelectasis are seen within the bilateral lung bases and posterior aspect of the right upper lobe. No pleural effusion or pneumothorax is identified. Musculoskeletal: Multilevel degenerative changes are seen throughout the thoracic spine. Review of the MIP images confirms the above findings. CTA ABDOMEN AND PELVIS FINDINGS VASCULAR Aorta: There is extensive aortic atherosclerosis with tortuosity and 3.5 cm x 3.2 cm aneurysmal dilatation of the infrarenal abdominal aorta. There is no evidence of dissection, vasculitis or significant stenosis. Celiac and SMA: A shared origin of the celiac and superior mesenteric arteries  is suspected, as separate origins for each cannot be identified. There is no evidence of aneurysmal dilatation, dissection, vasculitis or significant stenosis. Renals: Both renal arteries are patent, with marked severity narrowing and suspected hemodynamically significant stenosis of the origin of the right renal artery. There is no evidence of renal artery aneurysm, dissection, vasculitis, fibromuscular dysplasia. IMA: Patent without evidence of aneurysm, dissection, vasculitis or significant stenosis. Inflow: Patent with extensive atherosclerotic disease. No evidence of aneurysm, dissection, vasculitis or significant stenosis. A left common femoral artery stent is noted. Veins: No obvious venous abnormality within the limitations of this arterial phase study. Review of the MIP images confirms the above findings. NON-VASCULAR Hepatobiliary: No focal liver abnormality is seen. The gallbladder is contracted without evidence of gallstones, gallbladder wall thickening, or biliary dilatation. Pancreas: Unremarkable. No pancreatic ductal dilatation or surrounding inflammatory changes. Spleen: Normal in size without focal abnormality. Adrenals/Urinary Tract: There is mild diffuse enlargement of the left adrenal gland. The right adrenal gland is unremarkable. Kidneys are normal in size, without renal calculi or hydronephrosis. A 2.7 cm simple cyst is seen within the lower pole of the left kidney. A 1.2 cm simple cyst is noted within the medial aspect of the mid right kidney. Bladder is unremarkable. Stomach/Bowel: Stomach is within normal limits. Appendix appears normal. No evidence of bowel dilatation. An area of mesenteric twisting is seen within the medial aspect of the mid to lower left abdomen (axial CT images 210 through 249, CT series 7). As a result, moderately thickened and poorly distended loops of sigmoid colon are seen within the abdomen on the right. Lymphatic: No abnormal abdominal or pelvic lymph nodes are  identified. Reproductive: A mild-to-moderate amount of air attenuation is seen within the endometrial canal (axial CT images 242 through 254, CT series 7. The bilateral adnexa are unremarkable. Other: No abdominal wall hernia or abnormality. No abdominopelvic ascites. Musculoskeletal: Multilevel degenerative changes seen throughout the lumbar spine. Review of the MIP images confirms the above findings. IMPRESSION: 1. No evidence of pulmonary embolism. 2. 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 3. Extensive aortic atherosclerosis with tortuosity and 3.5 cm x 3.2 cm aneurysmal dilatation of the infrarenal abdominal aorta. 4. Marked severity narrowing and suspected hemodynamically significant stenosis of the origin of the right renal artery. 5. Area of mesenteric twisting within the medial aspect of the mid to lower left abdomen, with resultant inflamed loops of sigmoid colon noted within the abdomen on the right. While this may represent  an internal hernia, follow-up with abdomen and pelvis CT is recommended to exclude the presence of an early sigmoid volvulus. 6. Mild-to-moderate amount of air attenuation within the endometrial canal. Correlation with pelvic ultrasound is recommended. 7. Bilateral simple renal cysts. 8. Aortic atherosclerosis. Electronically Signed   By: Suzen Dials M.D.   On: 07/17/2024 03:40   CT Head Wo Contrast Result Date: 07/17/2024 EXAM: CT HEAD WITHOUT CONTRAST 07/17/2024 02:43:04 AM TECHNIQUE: CT of the head was performed without the administration of intravenous contrast. Automated exposure control, iterative reconstruction, and/or weight based adjustment of the mA/kV was utilized to reduce the radiation dose to as low as reasonably achievable. COMPARISON: None available. CLINICAL HISTORY: Neuro deficit, acute, stroke suspected FINDINGS: BRAIN AND VENTRICLES: No acute hemorrhage. No evidence of acute  large vascular territory infarct. Advanced patchy white matter hypodensities, compatible with chronic microvascular ischemic change. No hydrocephalus. No extra-axial collection. Stable 1 cm left posterior frontal lobe calcified extra-axial mass without mass effect, compatible with meningioma. ORBITS: No acute abnormality. SINUSES: No acute abnormality. SOFT TISSUES AND SKULL: No skull fracture. IMPRESSION: 1. No evidence of acute large vascular territory infarct. 2. Advanced chronic microvascular ischemic change. A white matter infarct could easily be obscured. Recommend low threshold for MRI. 3. Stable 1 cm left posterior frontal lobe meningioma without mass effect. Electronically signed by: Gilmore Molt MD 07/17/2024 03:11 AM EST RP Workstation: [id number]     Procedures   Medications Ordered in the ED  ondansetron  (ZOFRAN ) injection 4 mg (4 mg Intravenous Not Given 07/17/24 0118)  iohexol  (OMNIPAQUE ) 350 MG/ML injection 100 mL (has no administration in time range)  iohexol  (OMNIPAQUE ) 350 MG/ML injection 100 mL (100 mLs Intravenous Contrast Given 07/17/24 0235)                                    Medical Decision Making Amount and/or Complexity of Data Reviewed Labs: ordered. Radiology: ordered.  Risk Prescription drug management. Decision regarding hospitalization.   Patient here for evaluation of abdominal pain, diarrhea after eating fast food.  She has mild tenderness on examination without peritoneal findings.  CBC with mild leukocytosis.  She did have transient numbness, weakness to bilateral upper extremities, history of similar in the past.  CT head without acute abnormality although this is a limited study.  Patient's symptoms have completely resolved in terms of her numbness and weakness.  CTA demonstrates stable AAA.  CT does demonstrate possible internal hernia.  Discussed with Dr. Teresa with general surgery-recommends medicine admission for observation and surgery team will  see the patient in consult.  Patient updated of findings of studies and she is in agreement with observation admission at this time.  Hospitalist, Dr. Collie, consulted for admission.     Final diagnoses:  Generalized abdominal pain    ED Discharge Orders     None          Griselda Norris, MD 07/17/24 [phone]

## 2024-07-17 NOTE — Assessment & Plan Note (Signed)
 Creatinine 1.33 with GFR in the 40s Looks to be near baseline versus mildly dry Gentle IV fluids as indicated Monitor

## 2024-07-18 DIAGNOSIS — K559 Vascular disorder of intestine, unspecified: Secondary | ICD-10-CM

## 2024-07-18 DIAGNOSIS — K558 Other vascular disorders of intestine: Secondary | ICD-10-CM | POA: Diagnosis not present

## 2024-07-18 DIAGNOSIS — K599 Functional intestinal disorder, unspecified: Secondary | ICD-10-CM

## 2024-07-18 DIAGNOSIS — R933 Abnormal findings on diagnostic imaging of other parts of digestive tract: Secondary | ICD-10-CM

## 2024-07-18 DIAGNOSIS — K562 Volvulus: Secondary | ICD-10-CM | POA: Insufficient documentation

## 2024-07-18 DIAGNOSIS — K5909 Other constipation: Secondary | ICD-10-CM | POA: Diagnosis not present

## 2024-07-18 LAB — COMPREHENSIVE METABOLIC PANEL WITH GFR
ALT: 12 U/L (ref 0–44)
AST: 13 U/L — ABNORMAL LOW (ref 15–41)
Albumin: 3 g/dL — ABNORMAL LOW (ref 3.5–5.0)
Alkaline Phosphatase: 68 U/L (ref 38–126)
Anion gap: 10 (ref 5–15)
BUN: 14 mg/dL (ref 8–23)
CO2: 24 mmol/L (ref 22–32)
Calcium: 8.9 mg/dL (ref 8.9–10.3)
Chloride: 103 mmol/L (ref 98–111)
Creatinine, Ser: 0.99 mg/dL (ref 0.44–1.00)
GFR, Estimated: 58 mL/min — ABNORMAL LOW (ref >60)
Glucose, Bld: 107 mg/dL — ABNORMAL HIGH (ref 70–99)
Potassium: 3.7 mmol/L (ref 3.5–5.1)
Sodium: 137 mmol/L (ref 135–145)
Total Bilirubin: 0.3 mg/dL (ref 0.0–1.2)
Total Protein: 6.2 g/dL — ABNORMAL LOW (ref 6.5–8.1)

## 2024-07-18 LAB — CBC
HCT: 37.5 % (ref 36.0–46.0)
Hemoglobin: 12 g/dL (ref 12.0–15.0)
MCH: 28 pg (ref 26.0–34.0)
MCHC: 32 g/dL (ref 30.0–36.0)
MCV: 87.6 fL (ref 80.0–100.0)
Platelets: 244 K/uL (ref 150–400)
RBC: 4.28 MIL/uL (ref 3.87–5.11)
RDW: 14.6 % (ref 11.5–15.5)
WBC: 7.3 K/uL (ref 4.0–10.5)
nRBC: 0 % (ref 0.0–0.2)

## 2024-07-18 MED ORDER — LOSARTAN POTASSIUM 50 MG PO TABS
50.0000 mg | ORAL_TABLET | Freq: Every day | ORAL | Status: DC
  Administered 2024-07-19 – 2024-07-22 (×3): 50 mg via ORAL
  Filled 2024-07-18 (×4): qty 1

## 2024-07-18 MED ORDER — ASPIRIN 81 MG PO TBEC
81.0000 mg | DELAYED_RELEASE_TABLET | Freq: Every day | ORAL | Status: DC
  Administered 2024-07-19 – 2024-07-22 (×3): 81 mg via ORAL
  Filled 2024-07-18 (×4): qty 1

## 2024-07-18 MED ORDER — SENNOSIDES-DOCUSATE SODIUM 8.6-50 MG PO TABS
2.0000 | ORAL_TABLET | Freq: Every evening | ORAL | Status: DC | PRN
  Administered 2024-07-21: 2 via ORAL
  Filled 2024-07-18: qty 2

## 2024-07-18 MED ORDER — AMLODIPINE BESYLATE 5 MG PO TABS
5.0000 mg | ORAL_TABLET | Freq: Every day | ORAL | Status: DC
  Administered 2024-07-19 – 2024-07-22 (×3): 5 mg via ORAL
  Filled 2024-07-18 (×4): qty 1

## 2024-07-18 MED ORDER — CLOPIDOGREL BISULFATE 75 MG PO TABS
75.0000 mg | ORAL_TABLET | Freq: Every day | ORAL | Status: DC
  Administered 2024-07-19 – 2024-07-22 (×3): 75 mg via ORAL
  Filled 2024-07-18 (×4): qty 1

## 2024-07-18 MED ORDER — DULOXETINE HCL 30 MG PO CPEP
30.0000 mg | ORAL_CAPSULE | Freq: Every day | ORAL | Status: DC
  Administered 2024-07-18 – 2024-07-21 (×4): 30 mg via ORAL
  Filled 2024-07-18 (×4): qty 1

## 2024-07-18 MED ORDER — DULOXETINE HCL 60 MG PO CPEP
60.0000 mg | ORAL_CAPSULE | Freq: Every day | ORAL | Status: DC
  Administered 2024-07-19 – 2024-07-22 (×3): 60 mg via ORAL
  Filled 2024-07-18 (×4): qty 1

## 2024-07-18 NOTE — Plan of Care (Signed)
  Problem: Education: Goal: Knowledge of General Education information will improve Description: Including pain rating scale, medication(s)/side effects and non-pharmacologic comfort measures Outcome: Progressing   Problem: Health Behavior/Discharge Planning: Goal: Ability to manage health-related needs will improve Outcome: Progressing   Problem: Clinical Measurements: Goal: Ability to maintain clinical measurements within normal limits will improve Outcome: Progressing Goal: Will remain free from infection Outcome: Progressing Goal: Diagnostic test results will improve Outcome: Progressing Goal: Respiratory complications will improve Outcome: Progressing Goal: Cardiovascular complication will be avoided Outcome: Progressing   Problem: Coping: Goal: Level of anxiety will decrease Outcome: Progressing   Problem: Nutrition: Goal: Adequate nutrition will be maintained Outcome: Progressing   Problem: Elimination: Goal: Will not experience complications related to bowel motility Outcome: Progressing Goal: Will not experience complications related to urinary retention Outcome: Progressing   Problem: Pain Managment: Goal: General experience of comfort will improve and/or be controlled Outcome: Progressing

## 2024-07-18 NOTE — Evaluation (Signed)
 Physical Therapy Evaluation Patient Details Name: [name] MRN: [id number] DOB: 04/13/1946 Today's Date: 07/18/2024  History of Present Illness  Pt is a 78 y.o. female presents to ED 11/06 via EMS  sudden onset of generalized lower abdominal pain overnight with nausea vomiting as well as increased bowel movements. 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 Pt has 24/7 aides, and requires assistance for transfers to her wheelchair and ADLs and iADLs. Pt is currently exhausted from 2 days in ED and is agreeable to answering questions and rolling for cleaning and pericare. Pt is min-modA for rolling to the R and max to total for rolling to her L. Pt has near full AAROM in her R LE.  Pt reports that she has been working with her caregiver on stand pivot transfers. PT recommends HHPT at discharge. PT will continue to follow acutely.       If plan is discharge home, recommend the following: A lot of help with walking and/or transfers;A lot of help with bathing/dressing/bathroom;Assistance with cooking/housework;Direct supervision/assist for medications management;Assistance with feeding;Direct supervision/assist for financial management;Assist for transportation;Help with stairs or ramp for entrance;Supervision due to cognitive status   Can travel by private vehicle    No    Equipment Recommendations None recommended by PT     Functional Status Assessment Patient has had a recent decline in their functional status and demonstrates the ability to make significant improvements in function in a reasonable and predictable amount of  time.     Precautions / Restrictions Precautions Precautions: Fall Restrictions Weight Bearing Restrictions Per Provider Order: No          Pertinent Vitals/Pain Pain Assessment Pain Assessment: Faces Faces Pain Scale: Hurts little more Pain Location: L sided UE>LE with stretching    Home Living Family/patient expects to be discharged to:: Private residence Living Arrangements: Alone Available Help at Discharge: Personal care attendant;Available 24 hours/day Type of Home: House Home Access: Stairs to enter   [employer] of Steps: 1   Home Layout: Two level;Able to live on main level with bedroom/bathroom Home Equipment: Rollator (4 wheels);Rolling Walker (2 wheels);Shower seat;Wheelchair - manual;Hospital bed Additional Comments: hospital bed has been ordered but has not been delivered    Prior Function Prior Level of Function : Needs assist       Physical Assist : Mobility (physical);ADLs (physical)     Mobility Comments: pt is working with caregiver on stand pivot transfer to wheelchair, but right now she is squat pivot to the chair ADLs Comments: pt caregivers provide for ADLs and iADLs, pt's son come every 2-3 weeks and handles her finances     Extremity/Trunk Assessment   Upper Extremity Assessment Upper Extremity Assessment: Defer to OT evaluation    Lower Extremity Assessment Lower Extremity Assessment: RLE deficits/detail RLE Deficits / Details: AROM lacking end range due to strength and stiffness, grossly 3-/5 RLE Coordination: decreased fine motor       Communication   Communication Communication: Impaired Factors Affecting Communication: Reduced clarity of speech (very dry throat to begin with and even after drinking pt is hoarse)    Cognition Arousal: Alert, Lethargic Behavior During Therapy: Flat affect  PT - Cognitive impairments: Orientation   Orientation impairments: Time (reported it was 2026)                    PT - Cognition Comments: pt very tired from 2 days in ED, so reevaluate in next session Following commands: Intact       Cueing Cueing Techniques: Verbal cues, Gestural cues, Tactile cues, Visual cues     General Comments General comments (skin integrity, edema, etc.): psoriasis on back, OT rubbed back with Benadryl cream in the room        Assessment/Plan    PT Assessment Patient needs continued PT services  PT Problem List Decreased strength;Decreased range of motion;Decreased activity tolerance;Decreased balance;Decreased mobility;Decreased coordination;Decreased cognition;Decreased skin integrity       PT Treatment Interventions DME instruction;Gait training;Functional mobility training;Therapeutic activities;Therapeutic exercise;Balance training;Cognitive remediation;Patient/family education    PT Goals (Current goals can be found in the Care Plan section)  Acute Rehab PT Goals PT Goal Formulation: With patient Time For Goal Achievement: 08/01/24 Potential to Achieve Goals: Fair    Frequency Min 3X/week     Co-evaluation PT/OT/SLP Co-Evaluation/Treatment: Yes Reason for Co-Treatment: To address functional/ADL transfers PT goals addressed during session: Mobility/safety with mobility;Strengthening/ROM         AM-PAC PT 6 Clicks Mobility  Outcome Measure Help needed turning from your back to your side while in a flat bed without using bedrails?: A Little Help needed moving from lying on your back to sitting on the side of a flat bed without using bedrails?: A Lot Help needed moving to and from a bed to a chair (including a wheelchair)?: Total Help needed standing up from a chair using your arms (e.g., wheelchair or bedside chair)?: Total Help needed to walk in hospital room?: Total Help needed climbing 3-5 steps with a railing? : Total 6 Click Score: 9    End of Session   Activity Tolerance: Patient limited by fatigue Patient left: in bed;with call  bell/phone within reach;with bed alarm set Nurse Communication: Mobility status PT Visit Diagnosis: Unsteadiness on feet (R26.81);Other abnormalities of gait and mobility (R26.89);Muscle weakness (generalized) (M62.81);Difficulty in walking, not elsewhere classified (R26.2);Other symptoms and signs involving the nervous system (R29.898);Hemiplegia and hemiparesis Hemiplegia - Right/Left: Right Hemiplegia - dominant/non-dominant: Dominant Hemiplegia - caused by: Cerebral infarction    Time: 8966-8887 PT Time Calculation (min) (ACUTE ONLY): 39 min   Charges:   PT Evaluation $PT Eval Low Complexity: 1 Low PT Treatments $Therapeutic Activity: 8-22 mins PT General Charges $$ ACUTE PT VISIT: 1 Visit         [name] PT, DPT Acute Rehabilitation Services Please use secure chat or  Call Office [phone]   [name] [facility]eta [facility] 07/18/2024, 11:38 AM

## 2024-07-18 NOTE — TOC Progression Note (Addendum)
 Transition of Care [facility]) - Progression Note    Patient Details  Name: [name] MRN: [id number] Date of Birth: 1946/03/22  [facility] of [facility]) CM/SW Contact  Rosaline JONELLE Joe, RN Phone Number: 07/18/2024, 12:03 PM  Clinical Narrative:    CM attempted to meet with the patient but patient asked that I follow up this afternoon since she is currently on the phone.  07/18/24 1400 - Patient states that she has 24 hour/ 7 days a week caregivers through [facility] personal care services.    The patient was offered Medicare choice regarding home health services and patient did not have a preference.  I called Hedda CHEADLE and Darleene, RNCM accepted for services for PT/OT.  HH orders placed - to be co-signed by MD.  Patient states that she has a bedside commode already at home.  Patient states that when she is stable - that PTAR transport to home will be needed - I confirmed address with [facility].                      Expected Discharge Plan and Services                                               Social Drivers of Health (SDOH) Interventions SDOH Screenings   Food Insecurity: No Food Insecurity (07/17/2024)  Housing: Low Risk  (07/17/2024)  Transportation Needs: No Transportation Needs (07/17/2024)  Utilities: Not At Risk (07/17/2024)  Alcohol Screen: Low Risk  (06/13/2023)  Depression (PHQ2-9): High Risk (06/13/2023)  Financial Resource Strain: Low Risk  (03/23/2021)  Physical Activity: Inactive (06/13/2023)  Social Connections: Unknown (07/17/2024)  Stress: Stress Concern Present (06/13/2023)  Tobacco Use: High Risk (07/17/2024)  Health Literacy: Adequate Health Literacy (06/13/2023)    Readmission Risk Interventions     No data to display

## 2024-07-18 NOTE — Hospital Course (Addendum)
 Brief Narrative:   78 year old with history of CVA with right-sided hemiparesis, chronic constipation, PAD, HTN, COPD, CKD 3B, GERD, AAA, anxiety, depression admitted for abdominal pain with concerns of constipation which improved after bowel regimen.  CT also revealed internal hernia versus sigmoid colon.  Eventually flex sig revealed ischemic colitis and advised to continue bowel regimen.  General surgery and GI signed off.  Currently working on safe disposition with TOC for SNF placement.  Assessment & Plan:  Abdominal pain secondary to severe constipation and ischemic colitis Colonic dysmotility/chronic constipation Constipation improved with bowel regimen along with abdominal pain.  CT also revealed internal hernia versus sigmoid colon.  Eventually flex sig on 11/6 revealed ischemic colitis and advised to continue bowel regimen.  General surgery and GI signed off   History of PAD with femoral/tibial bypass AAA -Continue aspirin , Plavix , statin and outpatient follow-up with vascular   CKD stage IIIb -Creatinine around baseline 1.2   Essential hypertension, stable -Norvasc , losartan    Depression -Cymbalta    Prior history of CVA with residual right-sided weakness -On aspirin  Plavix  and statin.  Follows with neurology at [facility]   COPD -As needed bronchodilators   Debility - PT/OT-SNF     DVT prophylaxis: Lovenox     Code Status: Full Code Family Communication:  Called [name] No answer.  SNF placement PT Follow up Recs: Skilled Nursing-Short Term Rehab (<3 Hours/Day)07/20/2024 1251  Subjective: Seen at bedside no complaints   Examination:  General exam: Appears calm and comfortable  Respiratory system: Clear to auscultation. Respiratory effort normal. Cardiovascular system: S1 & S2 heard, RRR. No JVD, murmurs, rubs, gallops or clicks. No pedal edema. Gastrointestinal system: Abdomen is nondistended, soft and nontender. No organomegaly or masses felt. Normal bowel sounds  heard. Central nervous system: Alert and oriented. No focal neurological deficits. Extremities: Symmetric 5 x 5 power. Skin: No rashes, lesions or ulcers Psychiatry: Judgement and insight appear normal. Mood & affect appropriate.

## 2024-07-18 NOTE — Evaluation (Signed)
 Occupational Therapy Evaluation Patient Details Name: BARRY CULVERHOUSE MRN: [id number] DOB: [birth year] Today's Date: 07/18/2024   History of Present Illness   Pt is a 78 y.o. female presents to ED 11/06 via EMS  sudden onset of generalized lower abdominal pain overnight with nausea vomiting as well as increased bowel movements. 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 Pt now presents with decreased activity tolerance, increased fatigue, generalized L UE weakness, decreased ROM in R shoulder and elbow, increased pain with ROM in R shoulder and elbow, and decreased safety and independence with functional tasks. Pt currently demonstrates ability to complete ADLs with Set up to Total assist +2 and to roll R in the bed with Min-Mod assist and L with Mod-Max assist. Pt participated well in session and is motivated to return to PLOF. Pt will benefit from acute OT services to address deficits and increase safety and independence with functional tasks. Post acute discharge, pt will benefit from [facility] OT paired with continued 24/7 assist in the home.      If plan is discharge home, recommend the following:   Two people to help with walking and/or transfers;Two people to help with bathing/dressing/bathroom;Assistance with cooking/housework;Direct supervision/assist for financial management;Direct supervision/assist for medications management;Assist for transportation;Help with stairs or ramp for entrance     Functional Status Assessment   Patient has had a recent decline in  their functional status and demonstrates the ability to make significant improvements in function in a reasonable and predictable amount of time.     Equipment Recommendations   BSC/3in1     Recommendations for Other Services         Precautions/Restrictions   Precautions Precautions: Fall Restrictions Weight Bearing Restrictions Per Provider Order: No     Mobility Bed Mobility Overal bed mobility: Needs Assistance Bed Mobility: Rolling Rolling: Min assist, Mod assist, Max assist, Total assist         General bed mobility comments: min-modA for rolling R and Max-Total for rolling to L    Transfers                   General transfer comment: pt asked to defer due to fatigue      Balance                                           ADL either performed or assessed with clinical judgement   ADL Overall ADL's : Needs assistance/impaired Eating/Feeding: Set up;Bed level Eating/Feeding Details (indicate cue type and reason): using non-dominant L hand; pt frequently spilling food; may benefit from use of AE Grooming: Minimal assistance;Wash/dry face;Wash/dry hands;Bed level Grooming Details (indicate cue type and reason): using non-dominant L hand Upper Body Bathing: Maximal assistance;Bed level   Lower Body Bathing: Total assistance;+2 for physical assistance;Bed level   Upper Body Dressing : Moderate assistance;Maximal assistance;Cueing for compensatory techniques;Bed level   Lower Body Dressing: Total assistance;+2 for physical assistance;Bed level     Toilet Transfer Details (indicate cue type and reason): pt declined  sitting EOB or OOB transfer this session due to fatigue Toileting- Clothing Manipulation and Hygiene: Total assistance;+2 for physical assistance;Bed level         General ADL Comments: Pt reports caregivers usually do everything for her except for self-feeding; however, pt demonstrates ability to participate more  fully with UB ADLs than pt reports at home this session and reports she would like to be able to do more for herself.     Vision Baseline Vision/History: 0 No visual deficits Ability to See in Adequate Light: 0 Adequate Patient Visual Report: No change from baseline Vision Assessment?: No apparent visual deficits Additional Comments: Vision [facility] for tasks assessed; not formally screened     Perception         Praxis         Pertinent Vitals/Pain Pain Assessment Pain Assessment: Faces Faces Pain Scale: Hurts little more Pain Location: L sided UE>LE with stretching Pain Descriptors / Indicators: Discomfort, Grimacing, Guarding Pain Intervention(s): Limited activity within patient's tolerance, Monitored during session, Repositioned, Other (comment) (gentle PROM)     Extremity/Trunk Assessment Upper Extremity Assessment Upper Extremity Assessment: Right hand dominant;RUE deficits/detail (L UE strength, ROM, coordination, and sensation WFL) RUE Deficits / Details: R hemipelgia secondary to prior CVA; increased tone; stiffness in all joint; ver limited muscle activation in shoulder; Pt with self-ROM shoulder flexion to approx. 45 degrees and PROM to approx. 85 degrees; self-ROM and PROM elbow flexion to 135 degrees; weak but function with pt demonstrating AROM of all digits WFL; decreased coordination; decreased sensation RUE: Shoulder pain with ROM (Elbow pain with ROM) RUE Sensation: decreased light touch;decreased proprioception RUE Coordination: decreased fine motor;decreased gross motor   Lower Extremity Assessment Lower Extremity Assessment: Defer to PT evaluation RLE Deficits / Details: AROM lacking end range due to strength and stiffness, grossly 3-/5 RLE Coordination: decreased fine motor       Communication Communication Communication: Impaired Factors Affecting Communication: Reduced clarity of speech (very dry throat to begin with and even after drinking pt is  hoarse)   Cognition Arousal: Alert, Lethargic Behavior During Therapy: WFL for tasks assessed/performed, Flat affect Cognition: Cognition impaired   Orientation impairments: Time (Pt stating it is 2026) Awareness: Intellectual awareness intact, Online awareness intact   Attention impairment (select first level of impairment): Alternating attention Executive functioning impairment (select all impairments): Organization, Problem solving (requires increased time) OT - Cognition Comments: Pt cognition largely [facility] for tasks assessed but with deficits noted above. Pt with high level of fatigue and intermittent lethargy, which may be affecting pt cognition. Plan to reassess next session.                 Following commands: Intact       Cueing  General Comments   Cueing Techniques: Verbal cues;Gestural cues;Tactile cues;Visual cues  psoriasis on back, OT rubbed back with Benadryl cream in the room; MD present during a portion of session   Exercises     Shoulder Instructions      Home Living Family/patient expects to be discharged to:: Private residence Living Arrangements: Alone Available Help at Discharge: Personal care attendant;Available 24 hours/day Type of Home: House Home Access: Stairs to enter [employer] of Steps: 1   Home Layout: Two level;Able to live on main level with bedroom/bathroom     Bathroom Shower/Tub: Producer, Television/film/video: Handicapped height Bathroom Accessibility: Yes   Home Equipment: Rollator (4 wheels);Rolling Walker (2 wheels);Shower seat;Wheelchair - manual;Hospital bed   Additional Comments:  hospital bed has been ordered but has not been delivered      Prior Functioning/Environment Prior Level of Function : Needs assist       Physical Assist : Mobility (physical);ADLs (physical)     Mobility Comments: pt is working with caregiver on stand pivot transfer to wheelchair, but right now she is squat pivot to the  chair ADLs Comments: Pt caregivers provide Set-up assist for self-feeding with pt reporting she drops a lot of food and Max to Total assist for other ADLs. Pt's son come every 2-3 weeks and handles her finances    OT Problem List: Decreased activity tolerance;Decreased range of motion;Impaired UE functional use;Pain;Impaired tone;Impaired sensation   OT Treatment/Interventions: Self-care/ADL training;Therapeutic exercise;DME and/or AE instruction;Manual therapy;Therapeutic activities;Neuromuscular education;Patient/family education;Balance training      OT Goals(Current goals can be found in the care plan section)   Acute Rehab OT Goals Patient Stated Goal: to return home, have less pain, participate more in daily tasks, and be able to take steps OT Goal Formulation: With patient Time For Goal Achievement: 08/01/24 Potential to Achieve Goals: Fair ADL Goals Pt Will Perform Grooming: with contact guard assist;sitting (with adaptive equipment as needed) Pt Will Perform Upper Body Dressing: with min assist;sitting Pt Will Transfer to Toilet: with max assist;stand pivot transfer;bedside commode (with least restrictive AD) Pt/caregiver will Perform Home Exercise Program: Increased ROM;Right Upper extremity;With minimal assist;With written HEP provided (PROM/AAROM of shoulder and elbow; decrease tone; maintain joint integrity; decreased pain) Additional ADL Goal #1: Patient will demonstrate ability to complete bed mobility with Min-Mod assist during/in preparation for functional tasks. Pt Will Perform Eating: with set-up;with adaptive utensils;sitting (with minimal spillage)   OT Frequency:  Min 2X/week    Co-evaluation PT/OT/SLP Co-Evaluation/Treatment: Yes Reason for Co-Treatment: To address functional/ADL transfers PT goals addressed during session: Mobility/safety with mobility;Strengthening/ROM OT goals addressed during session: ADL's and self-care;Strengthening/ROM      AM-PAC OT  6 Clicks Daily Activity     Outcome Measure Help from another person eating meals?: A Little Help from another person taking care of personal grooming?: A Lot Help from another person toileting, which includes using toliet, bedpan, or urinal?: Total Help from another person bathing (including washing, rinsing, drying)?: A Lot Help from another person to put on and taking off regular upper body clothing?: A Lot Help from another person to put on and taking off regular lower body clothing?: Total 6 Click Score: 11   End of Session Nurse Communication: Mobility status;Other (comment) (Also reported to NT that pt needs new PureWick)  Activity Tolerance: Patient tolerated treatment well;Patient limited by fatigue Patient left: in bed;with call bell/phone within reach;with bed alarm set  OT Visit Diagnosis: Other abnormalities of gait and mobility (R26.89);Ataxia, unspecified (R27.0);Muscle weakness (generalized) (M62.81);Hemiplegia and hemiparesis;Pain Hemiplegia - Right/Left: Right Hemiplegia - dominant/non-dominant: Dominant Hemiplegia - caused by: Cerebral infarction (prior to this admission)                Time: 8966-8874 OT Time Calculation (min): 52 min Charges:  OT General Charges $OT Visit: 1 Visit OT Evaluation $OT Eval Low Complexity: 1 Low OT Treatments $Self Care/Home Management : 8-22 mins  Margarie Rockey HERO., OTR/L, MA Acute Rehab [phone]   Margarie FORBES Horns 07/18/2024, 12:46 PM

## 2024-07-18 NOTE — Progress Notes (Signed)
 PROGRESS NOTE    [name]  FMW:[id number] DOB: [birth year] DOA: 07/16/2024 PCP: Levora Reyes SAUNDERS, MD    Brief Narrative:  Patient is an 78 year old female with PMHx of CVA with right-sided hemiparesis, chronic constipation, PAD, HTN, COPD, CKD stage IIIb, AAA, GERD, anxiety, depression who presented with several hours of acute onset persistent lower abdominal pain associated with nausea and vomiting, which resolved after having a large bowel movement consisting of loose watery stools.  In the ED, she was afebrile and hemodynamically stable.  WBCs 11.9, creatinine 1.33.  CT chest abdomen pelvis showed mesenteric twisting within the medial aspect of the mid to lower abdomen with concern for internal hernia versus early sigmoid volvulus, in addition to severe calcification of the thoracic aorta with no aneurysm dilatation, 3.5 x 3 point centimeter aneurysm dilatation of the infrarenal abdominal aorta, significant stenosis of the right renal artery. She was evaluated by general surgery who indicated the patient's symptoms possibly consistent with sigmoid volvulus and recommended flexible sigmoidoscopy by GI.  She was consequently admitted.    Assessment and Plan:  # Ischemic colitis likely secondary to brief colonic obstruction from self resolved sigmoid volvulus - Presented with acute onset abdominal pain and vomiting, resolved after large bowel movement - CT abdomen pelvis showed mesenteric twisting within the mid to lower abdomen concerning for early sigmoid volvulus versus internal hernia - Flexible sigmoidoscopy showed segmental mucosal changes in descending and sigmoid colon consistent with ischemic colitis; no evidence of acute volvulus or stricture - Currently hemodynamically stable, symptoms resolved - Continue supportive care - Advance diet as tolerated - Avoid constipation with aggressive bowel regimen per GI including MiraLAX  17 g p.o. twice daily, senna 2 tabs p.o. nightly as  needed - Encourage hydration and ambulation.  Monitor for recurrent abdominal pain, hematochezia, or ileus.  No bowel movements currently above passing flatus. - Surgery and GI signed off  #Chronic constipation/colonic dysmotility - Longstanding tortuous colon predisposing to recurrent obstipation or volvulus - Continue bowel regimen as above - Patient educated on daily fiber, hydration, and avoidance of laxative overuse  #PAD status post femoral-tibial bypass #AAA (3.5 x 3.0 cm infrarenal) - Stable per CT - Continue antiplatelet therapy with Plavix  and aspirin  - Outpatient vascular follow-up recommended for AAA surveillance and renal artery stenosis  #CKD stage IIIb - Baseline creatinine ~ 1.3 - Stable  #HTN -Continue home losartan  and amlodipine   #Depression - Continue home Cymbalta   #History of CVA with residual right-sided weakness - Continue DAPT with aspirin  and Plavix   #COPD - Stable with no acute exacerbation  #Debility - PT OT consulted for evaluation -recommending home with home health   DVT prophylaxis: enoxaparin  (LOVENOX ) injection 40 mg Start: 07/17/24 0900   Code Status:   Code Status: Full Code  Family Communication: Discussed with caretaker Arlean at bedside  Disposition Plan: Home with home health, likely discharge on 11/8 PT Follow up Recs: Home Health Pt11/03/2024 1100  Level of care: Med-Surg  Consultants:  General Surgery, gastroenterology  Procedures:  Flexible sigmoidoscopy  Antimicrobials: None   Subjective: Patient examined at the bedside today.  She is status post flexible sigmoidoscopy.  Reports mild soreness in the left lower quadrant.  Has been passing flatus but no bowel movements.  Tolerated some solid food but has not had a big meal yet.  Wishes to be monitored for another day.  Objective: Vitals:   07/18/24 0523 07/18/24 0747 07/18/24 1336 07/18/24 1726  BP: (!) 142/97 (!) 165/91 (!) 120/93 114/83  Pulse: 81 80 79 81   Resp: 16     Temp: 97.6 F (36.4 C) 97.6 F (36.4 C)  98.9 F (37.2 C)  TempSrc: Oral     SpO2: 93% 97% 96% 96%  Weight:      Height:        Intake/Output Summary (Last 24 hours) at 07/18/2024 2051 Last data filed at 07/18/2024 1726 Gross per 24 hour  Intake 240 ml  Output 2225 ml  Net -1985 ml   Filed Weights   07/17/24 1159  Weight: 78.5 kg    Examination:  General exam: Appears calm and comfortable  Respiratory system: Clear to auscultation. Respiratory effort normal. Cardiovascular system: S1 & S2 heard, RRR. No JVD, murmurs. No pedal edema. Gastrointestinal system: Abdomen is nondistended, soft.  Mild TTP at LLQ. No organomegaly or masses felt.  Hyperactive bowel sounds in all 4 quadrants. Central nervous system: Alert and oriented. No focal neurological deficits. Extremities: Symmetric 5 x 5 power. Skin: No rashes, lesions or ulcers Psychiatry: Judgement and insight appear normal. Mood & affect appropriate.     Data Reviewed: I have personally reviewed following labs and imaging studies  CBC: Recent Labs  Lab 07/16/24 2205 07/18/24 0515  WBC 11.9* 7.3  HGB 14.1 12.0  HCT 44.9 37.5  MCV 88.9 87.6  PLT 296 244   Basic Metabolic Panel: Recent Labs  Lab 07/16/24 2205 07/18/24 0515  NA 137 137  K 3.8 3.7  CL 100 103  CO2 26 24  GLUCOSE 135* 107*  BUN 15 14  CREATININE 1.33* 0.99  CALCIUM  9.4 8.9   GFR: Estimated Creatinine Clearance: 48.9 mL/min (by C-G formula based on SCr of 0.99 mg/dL). Liver Function Tests: Recent Labs  Lab 07/16/24 2205 07/18/24 0515  AST 17 13*  ALT 14 12  ALKPHOS 76 68  BILITOT 0.6 0.3  PROT 7.4 6.2*  ALBUMIN  3.7 3.0*   Recent Labs  Lab 07/16/24 2205  LIPASE 31   No results for input(s): AMMONIA in the last 168 hours. Coagulation Profile: No results for input(s): INR, PROTIME in the last 168 hours. Cardiac Enzymes: No results for input(s): CKTOTAL, CKMB, CKMBINDEX, TROPONINI in the last 168  hours. BNP (last 3 results) No results for input(s): PROBNP in the last 8760 hours. HbA1C: No results for input(s): HGBA1C in the last 72 hours. CBG: No results for input(s): GLUCAP in the last 168 hours. Lipid Profile: No results for input(s): CHOL, HDL, LDLCALC, TRIG, CHOLHDL, LDLDIRECT in the last 72 hours. Thyroid  Function Tests: No results for input(s): TSH, T4TOTAL, FREET4, T3FREE, THYROIDAB in the last 72 hours. Anemia Panel: No results for input(s): VITAMINB12, FOLATE, FERRITIN, TIBC, IRON, RETICCTPCT in the last 72 hours. Sepsis Labs: No results for input(s): PROCALCITON, LATICACIDVEN in the last 168 hours.  No results found for this or any previous visit (from the past 240 hours).   Radiology Studies: CT Angio Chest/Abd/Pel for Dissection W and/or W/WO Result Date: 07/17/2024 CLINICAL DATA:  Abdominal pain with nausea and vomiting. EXAM: CT ANGIOGRAPHY CHEST, ABDOMEN AND PELVIS TECHNIQUE: Non-contrast CT of the chest was initially obtained. Multidetector CT imaging through the chest, abdomen and pelvis was performed using the standard protocol during bolus administration of intravenous contrast. Multiplanar reconstructed images and MIPs were obtained and reviewed to evaluate the vascular anatomy. RADIATION DOSE REDUCTION: This exam was performed according to the departmental dose-optimization program which includes automated exposure control, adjustment of the mA and/or kV according to patient size and/or use of  iterative reconstruction technique. CONTRAST:  OMNIPAQUE  IOHEXOL  350 MG/ML SOLN COMPARISON:  October 02, 2023 FINDINGS: CTA CHEST FINDINGS Cardiovascular: There is marked severity calcification of the thoracic aorta. The ascending thoracic aorta measures 3.2 cm in diameter, while the descending thoracic aorta measures approximately 3.7 cm. Satisfactory opacification of the pulmonary arteries to the segmental level. No evidence of  pulmonary embolism. There is mild cardiomegaly. No pericardial effusion. Mediastinum/Nodes: No enlarged mediastinal, hilar, or axillary lymph nodes. Thyroid  gland, trachea, and esophagus demonstrate no significant findings. Lungs/Pleura: There is evidence of extensive emphysematous lung disease. Moderate severity areas of scarring, fibrosis and atelectasis are seen within the bilateral lung bases and posterior aspect of the right upper lobe. No pleural effusion or pneumothorax is identified. Musculoskeletal: Multilevel degenerative changes are seen throughout the thoracic spine. Review of the MIP images confirms the above findings. CTA ABDOMEN AND PELVIS FINDINGS VASCULAR Aorta: There is extensive aortic atherosclerosis with tortuosity and 3.5 cm x 3.2 cm aneurysmal dilatation of the infrarenal abdominal aorta. There is no evidence of dissection, vasculitis or significant stenosis. Celiac and SMA: A shared origin of the celiac and superior mesenteric arteries is suspected, as separate origins for each cannot be identified. There is no evidence of aneurysmal dilatation, dissection, vasculitis or significant stenosis. Renals: Both renal arteries are patent, with marked severity narrowing and suspected hemodynamically significant stenosis of the origin of the right renal artery. There is no evidence of renal artery aneurysm, dissection, vasculitis, fibromuscular dysplasia. IMA: Patent without evidence of aneurysm, dissection, vasculitis or significant stenosis. Inflow: Patent with extensive atherosclerotic disease. No evidence of aneurysm, dissection, vasculitis or significant stenosis. A left common femoral artery stent is noted. Veins: No obvious venous abnormality within the limitations of this arterial phase study. Review of the MIP images confirms the above findings. NON-VASCULAR Hepatobiliary: No focal liver abnormality is seen. The gallbladder is contracted without evidence of gallstones, gallbladder wall  thickening, or biliary dilatation. Pancreas: Unremarkable. No pancreatic ductal dilatation or surrounding inflammatory changes. Spleen: Normal in size without focal abnormality. Adrenals/Urinary Tract: There is mild diffuse enlargement of the left adrenal gland. The right adrenal gland is unremarkable. Kidneys are normal in size, without renal calculi or hydronephrosis. A 2.7 cm simple cyst is seen within the lower pole of the left kidney. A 1.2 cm simple cyst is noted within the medial aspect of the mid right kidney. Bladder is unremarkable. Stomach/Bowel: Stomach is within normal limits. Appendix appears normal. No evidence of bowel dilatation. An area of mesenteric twisting is seen within the medial aspect of the mid to lower left abdomen (axial CT images 210 through 249, CT series 7). As a result, moderately thickened and poorly distended loops of sigmoid colon are seen within the abdomen on the right. Lymphatic: No abnormal abdominal or pelvic lymph nodes are identified. Reproductive: A mild-to-moderate amount of air attenuation is seen within the endometrial canal (axial CT images 242 through 254, CT series 7. The bilateral adnexa are unremarkable. Other: No abdominal wall hernia or abnormality. No abdominopelvic ascites. Musculoskeletal: Multilevel degenerative changes seen throughout the lumbar spine. Review of the MIP images confirms the above findings. IMPRESSION: 1. No evidence of pulmonary embolism. 2. 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 3. Extensive aortic atherosclerosis with tortuosity and 3.5 cm x 3.2 cm aneurysmal dilatation of the infrarenal abdominal aorta. 4. Marked severity narrowing and suspected hemodynamically significant stenosis of the origin of the right renal artery. 5. Area  of mesenteric twisting within the medial aspect of the mid to lower left abdomen, with resultant inflamed loops of sigmoid colon noted  within the abdomen on the right. While this may represent an internal hernia, follow-up with abdomen and pelvis CT is recommended to exclude the presence of an early sigmoid volvulus. 6. Mild-to-moderate amount of air attenuation within the endometrial canal. Correlation with pelvic ultrasound is recommended. 7. Bilateral simple renal cysts. 8. Aortic atherosclerosis. Electronically Signed   By: Suzen Dials M.D.   On: 07/17/2024 03:40   CT Head Wo Contrast Result Date: 07/17/2024 EXAM: CT HEAD WITHOUT CONTRAST 07/17/2024 02:43:04 AM TECHNIQUE: CT of the head was performed without the administration of intravenous contrast. Automated exposure control, iterative reconstruction, and/or weight based adjustment of the mA/kV was utilized to reduce the radiation dose to as low as reasonably achievable. COMPARISON: None available. CLINICAL HISTORY: Neuro deficit, acute, stroke suspected FINDINGS: BRAIN AND VENTRICLES: No acute hemorrhage. No evidence of acute large vascular territory infarct. Advanced patchy white matter hypodensities, compatible with chronic microvascular ischemic change. No hydrocephalus. No extra-axial collection. Stable 1 cm left posterior frontal lobe calcified extra-axial mass without mass effect, compatible with meningioma. ORBITS: No acute abnormality. SINUSES: No acute abnormality. SOFT TISSUES AND SKULL: No skull fracture. IMPRESSION: 1. No evidence of acute large vascular territory infarct. 2. Advanced chronic microvascular ischemic change. A white matter infarct could easily be obscured. Recommend low threshold for MRI. 3. Stable 1 cm left posterior frontal lobe meningioma without mass effect. Electronically signed by: Gilmore Molt MD 07/17/2024 03:11 AM EST RP Workstation: [id number]    Scheduled Meds:  clonazePAM   0.5 mg Oral QHS   enoxaparin  (LOVENOX ) injection  40 mg Subcutaneous Q24H   ondansetron  (ZOFRAN ) IV  4 mg Intravenous Once   polyethylene glycol  17 g Oral BID    rOPINIRole   2 mg Oral QHS   Continuous Infusions:   LOS:  LOS: 0 days   Time Spent: 45 minutes  Unresulted Labs (From admission, onward)     Start     Ordered   07/17/24 0058  Urinalysis, w/ Reflex to Culture (Infection Suspected) -Urine, Clean Catch  Once,   URGENT       Question:  Specimen Source  Answer:  Urine, Clean Catch   07/17/24 0058             Duffy Larch, MD [facility]  If 7PM-7AM, please contact night-coverage  07/18/2024, 8:51 PM

## 2024-07-18 NOTE — Progress Notes (Signed)
 Progress Note   Assessment    78 year old female with a long history of colonic dysmotility/chronic constipation, redundant colon, history of CVA on Plavix , PAD status post prior femoropopliteal bypass, chronic kidney disease and hypertension admitted with abdominal distention, pain and found to have ischemic colitis  Principal Problem:   Abdominal pain Active Problems:   Depression   HTN (hypertension)   History of CVA (cerebrovascular accident)   PVD (peripheral vascular disease)   Debility   CKD (chronic kidney disease), stage III (HCC)   Ischemic colitis   Abnormal CT scan, colon   Recommendations   1.  Ischemic colitis/chronic constipation in the setting of colonic dysmotility and redundant colon --laxative regimen is the key to trying to avoid recurrent constipation, obstipation.  Some of this is anatomic for her.  She has been using as needed [facility] over-the-counter laxatives, likely bisacodyl  versus senna when needed.  1 to 2 tablets. -- Supportive care for ischemic colitis -- MiraLAX  17 g twice daily -- Can use as needed senna 2 tablets nightly as needed -- Continue advanced diet  Hopefully she will be able to go back home soon GI will sign off, call if questions    Chief Complaint   No abdominal pain today, no bowel movement, positive flatus Working with PT and OT Hoping to eat a regular diet for lunch  Vital signs in last 24 hours: Temp:  [97.6 F (36.4 C)-98.1 F (36.7 C)] 97.6 F (36.4 C) (11/07 0747) Pulse Rate:  [78-94] 80 (11/07 0747) Resp:  [11-18] 16 (11/07 0523) BP: (104-165)/(61-97) 165/91 (11/07 0747) SpO2:  [93 %-99 %] 97 % (11/07 0747) Last BM Date : 07/16/24 Gen: awake, alert, NAD HEENT: anicteric  Abd: soft, NT/ND, +BS throughout Ext: no c/c/e Neuro: nonfocal  Intake/Output from previous day: 11/06 0701 - 11/07 0700 In: 640 [P.O.:240; I.V.:400] Out: 1325 [Urine:1325] Intake/Output this shift: Total I/O In: -  Out: 1050  [Urine:1050]  Lab Results: Recent Labs    07/16/24 2205 07/18/24 0515  WBC 11.9* 7.3  HGB 14.1 12.0  HCT 44.9 37.5  PLT 296 244   BMET Recent Labs    07/16/24 2205 07/18/24 0515  NA 137 137  K 3.8 3.7  CL 100 103  CO2 26 24  GLUCOSE 135* 107*  BUN 15 14  CREATININE 1.33* 0.99  CALCIUM  9.4 8.9   LFT Recent Labs    07/18/24 0515  PROT 6.2*  ALBUMIN  3.0*  AST 13*  ALT 12  ALKPHOS 68  BILITOT 0.3   Studies/Results: CT Angio Chest/Abd/Pel for Dissection W and/or W/WO Result Date: 07/17/2024 CLINICAL DATA:  Abdominal pain with nausea and vomiting. EXAM: CT ANGIOGRAPHY CHEST, ABDOMEN AND PELVIS TECHNIQUE: Non-contrast CT of the chest was initially obtained. Multidetector CT imaging through the chest, abdomen and pelvis was performed using the standard protocol during bolus administration of intravenous contrast. Multiplanar reconstructed images and MIPs were obtained and reviewed to evaluate the vascular anatomy. RADIATION DOSE REDUCTION: This exam was performed according to the departmental dose-optimization program which includes automated exposure control, adjustment of the mA and/or kV according to patient size and/or use of iterative reconstruction technique. CONTRAST:  OMNIPAQUE  IOHEXOL  350 MG/ML SOLN COMPARISON:  October 02, 2023 FINDINGS: CTA CHEST FINDINGS Cardiovascular: There is marked severity calcification of the thoracic aorta. The ascending thoracic aorta measures 3.2 cm in diameter, while the descending thoracic aorta measures approximately 3.7 cm. Satisfactory opacification of the pulmonary arteries to the segmental level. No evidence of  pulmonary embolism. There is mild cardiomegaly. No pericardial effusion. Mediastinum/Nodes: No enlarged mediastinal, hilar, or axillary lymph nodes. Thyroid  gland, trachea, and esophagus demonstrate no significant findings. Lungs/Pleura: There is evidence of extensive emphysematous lung disease. Moderate severity areas of  scarring, fibrosis and atelectasis are seen within the bilateral lung bases and posterior aspect of the right upper lobe. No pleural effusion or pneumothorax is identified. Musculoskeletal: Multilevel degenerative changes are seen throughout the thoracic spine. Review of the MIP images confirms the above findings. CTA ABDOMEN AND PELVIS FINDINGS VASCULAR Aorta: There is extensive aortic atherosclerosis with tortuosity and 3.5 cm x 3.2 cm aneurysmal dilatation of the infrarenal abdominal aorta. There is no evidence of dissection, vasculitis or significant stenosis. Celiac and SMA: A shared origin of the celiac and superior mesenteric arteries is suspected, as separate origins for each cannot be identified. There is no evidence of aneurysmal dilatation, dissection, vasculitis or significant stenosis. Renals: Both renal arteries are patent, with marked severity narrowing and suspected hemodynamically significant stenosis of the origin of the right renal artery. There is no evidence of renal artery aneurysm, dissection, vasculitis, fibromuscular dysplasia. IMA: Patent without evidence of aneurysm, dissection, vasculitis or significant stenosis. Inflow: Patent with extensive atherosclerotic disease. No evidence of aneurysm, dissection, vasculitis or significant stenosis. A left common femoral artery stent is noted. Veins: No obvious venous abnormality within the limitations of this arterial phase study. Review of the MIP images confirms the above findings. NON-VASCULAR Hepatobiliary: No focal liver abnormality is seen. The gallbladder is contracted without evidence of gallstones, gallbladder wall thickening, or biliary dilatation. Pancreas: Unremarkable. No pancreatic ductal dilatation or surrounding inflammatory changes. Spleen: Normal in size without focal abnormality. Adrenals/Urinary Tract: There is mild diffuse enlargement of the left adrenal gland. The right adrenal gland is unremarkable. Kidneys are normal in  size, without renal calculi or hydronephrosis. A 2.7 cm simple cyst is seen within the lower pole of the left kidney. A 1.2 cm simple cyst is noted within the medial aspect of the mid right kidney. Bladder is unremarkable. Stomach/Bowel: Stomach is within normal limits. Appendix appears normal. No evidence of bowel dilatation. An area of mesenteric twisting is seen within the medial aspect of the mid to lower left abdomen (axial CT images 210 through 249, CT series 7). As a result, moderately thickened and poorly distended loops of sigmoid colon are seen within the abdomen on the right. Lymphatic: No abnormal abdominal or pelvic lymph nodes are identified. Reproductive: A mild-to-moderate amount of air attenuation is seen within the endometrial canal (axial CT images 242 through 254, CT series 7. The bilateral adnexa are unremarkable. Other: No abdominal wall hernia or abnormality. No abdominopelvic ascites. Musculoskeletal: Multilevel degenerative changes seen throughout the lumbar spine. Review of the MIP images confirms the above findings. IMPRESSION: 1. No evidence of pulmonary embolism. 2. 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 3. Extensive aortic atherosclerosis with tortuosity and 3.5 cm x 3.2 cm aneurysmal dilatation of the infrarenal abdominal aorta. 4. Marked severity narrowing and suspected hemodynamically significant stenosis of the origin of the right renal artery. 5. Area of mesenteric twisting within the medial aspect of the mid to lower left abdomen, with resultant inflamed loops of sigmoid colon noted within the abdomen on the right. While this may represent an internal hernia, follow-up with abdomen and pelvis CT is recommended to exclude the presence of an early sigmoid volvulus. 6. Mild-to-moderate amount of air attenuation within the endometrial  canal. Correlation with pelvic ultrasound is recommended. 7. Bilateral  simple renal cysts. 8. Aortic atherosclerosis. Electronically Signed   By: Suzen Dials M.D.   On: 07/17/2024 03:40   CT Head Wo Contrast Result Date: 07/17/2024 EXAM: CT HEAD WITHOUT CONTRAST 07/17/2024 02:43:04 AM TECHNIQUE: CT of the head was performed without the administration of intravenous contrast. Automated exposure control, iterative reconstruction, and/or weight based adjustment of the mA/kV was utilized to reduce the radiation dose to as low as reasonably achievable. COMPARISON: None available. CLINICAL HISTORY: Neuro deficit, acute, stroke suspected FINDINGS: BRAIN AND VENTRICLES: No acute hemorrhage. No evidence of acute large vascular territory infarct. Advanced patchy white matter hypodensities, compatible with chronic microvascular ischemic change. No hydrocephalus. No extra-axial collection. Stable 1 cm left posterior frontal lobe calcified extra-axial mass without mass effect, compatible with meningioma. ORBITS: No acute abnormality. SINUSES: No acute abnormality. SOFT TISSUES AND SKULL: No skull fracture. IMPRESSION: 1. No evidence of acute large vascular territory infarct. 2. Advanced chronic microvascular ischemic change. A white matter infarct could easily be obscured. Recommend low threshold for MRI. 3. Stable 1 cm left posterior frontal lobe meningioma without mass effect. Electronically signed by: Gilmore Molt MD 07/17/2024 03:11 AM EST RP Workstation: [id number]      LOS: 0 days   Gordy CHRISTELLA Starch, MD 07/18/2024, 12:37 PM See [facility], [facility], to contact our on call provider

## 2024-07-18 NOTE — Progress Notes (Signed)
 1 Day Post-Op  Subjective: No complaints.  Very tired.  Sleeping, but does arouse to answer some questions.  Seems she is tolerating a soft diet with no pain or issues.  No further BMs since being here.   Objective: Vital signs in last 24 hours: Temp:  [97.1 F (36.2 C)-98.1 F (36.7 C)] 97.6 F (36.4 C) (11/07 0747) Pulse Rate:  [78-94] 80 (11/07 0747) Resp:  [11-18] 16 (11/07 0523) BP: (104-170)/(61-97) 165/91 (11/07 0747) SpO2:  [93 %-99 %] 97 % (11/07 0747) Weight:  [78.5 kg] 78.5 kg (11/06 1159) Last BM Date : 07/16/24  Intake/Output from previous day: 11/06 0701 - 11/07 0700 In: 640 [P.O.:240; I.V.:400] Out: 1325 [Urine:1325] Intake/Output this shift: No intake/output data recorded.  PE: Abd: soft, NT, ND  Lab Results:  Recent Labs    07/16/24 2205 07/18/24 0515  WBC 11.9* 7.3  HGB 14.1 12.0  HCT 44.9 37.5  PLT 296 244   BMET Recent Labs    07/16/24 2205 07/18/24 0515  NA 137 137  K 3.8 3.7  CL 100 103  CO2 26 24  GLUCOSE 135* 107*  BUN 15 14  CREATININE 1.33* 0.99  CALCIUM  9.4 8.9   PT/INR No results for input(s): LABPROT, INR in the last 72 hours. CMP     Component Value Date/Time   NA 137 07/18/2024 0515   NA 135 01/02/2020 1602   K 3.7 07/18/2024 0515   CL 103 07/18/2024 0515   CO2 24 07/18/2024 0515   GLUCOSE 107 (H) 07/18/2024 0515   BUN 14 07/18/2024 0515   BUN 17 01/02/2020 1602   CREATININE 0.99 07/18/2024 0515   CREATININE 0.77 07/28/2015 1805   CALCIUM  8.9 07/18/2024 0515   PROT 6.2 (L) 07/18/2024 0515   PROT 7.9 01/02/2020 1602   ALBUMIN  3.0 (L) 07/18/2024 0515   ALBUMIN  4.2 01/02/2020 1602   AST 13 (L) 07/18/2024 0515   ALT 12 07/18/2024 0515   ALKPHOS 68 07/18/2024 0515   BILITOT 0.3 07/18/2024 0515   BILITOT 0.2 01/02/2020 1602   GFRNONAA 58 (L) 07/18/2024 0515   GFRAA 58 (L) 01/02/2020 1602   Lipase     Component Value Date/Time   LIPASE 31 07/16/2024 2205       Studies/Results: CT Angio  Chest/Abd/Pel for Dissection W and/or W/WO Result Date: 07/17/2024 CLINICAL DATA:  Abdominal pain with nausea and vomiting. EXAM: CT ANGIOGRAPHY CHEST, ABDOMEN AND PELVIS TECHNIQUE: Non-contrast CT of the chest was initially obtained. Multidetector CT imaging through the chest, abdomen and pelvis was performed using the standard protocol during bolus administration of intravenous contrast. Multiplanar reconstructed images and MIPs were obtained and reviewed to evaluate the vascular anatomy. RADIATION DOSE REDUCTION: This exam was performed according to the departmental dose-optimization program which includes automated exposure control, adjustment of the mA and/or kV according to patient size and/or use of iterative reconstruction technique. CONTRAST:  OMNIPAQUE  IOHEXOL  350 MG/ML SOLN COMPARISON:  October 02, 2023 FINDINGS: CTA CHEST FINDINGS Cardiovascular: There is marked severity calcification of the thoracic aorta. The ascending thoracic aorta measures 3.2 cm in diameter, while the descending thoracic aorta measures approximately 3.7 cm. Satisfactory opacification of the pulmonary arteries to the segmental level. No evidence of pulmonary embolism. There is mild cardiomegaly. No pericardial effusion. Mediastinum/Nodes: No enlarged mediastinal, hilar, or axillary lymph nodes. Thyroid  gland, trachea, and esophagus demonstrate no significant findings. Lungs/Pleura: There is evidence of extensive emphysematous lung disease. Moderate severity areas of scarring, fibrosis and atelectasis are  seen within the bilateral lung bases and posterior aspect of the right upper lobe. No pleural effusion or pneumothorax is identified. Musculoskeletal: Multilevel degenerative changes are seen throughout the thoracic spine. Review of the MIP images confirms the above findings. CTA ABDOMEN AND PELVIS FINDINGS VASCULAR Aorta: There is extensive aortic atherosclerosis with tortuosity and 3.5 cm x 3.2 cm aneurysmal dilatation of  the infrarenal abdominal aorta. There is no evidence of dissection, vasculitis or significant stenosis. Celiac and SMA: A shared origin of the celiac and superior mesenteric arteries is suspected, as separate origins for each cannot be identified. There is no evidence of aneurysmal dilatation, dissection, vasculitis or significant stenosis. Renals: Both renal arteries are patent, with marked severity narrowing and suspected hemodynamically significant stenosis of the origin of the right renal artery. There is no evidence of renal artery aneurysm, dissection, vasculitis, fibromuscular dysplasia. IMA: Patent without evidence of aneurysm, dissection, vasculitis or significant stenosis. Inflow: Patent with extensive atherosclerotic disease. No evidence of aneurysm, dissection, vasculitis or significant stenosis. A left common femoral artery stent is noted. Veins: No obvious venous abnormality within the limitations of this arterial phase study. Review of the MIP images confirms the above findings. NON-VASCULAR Hepatobiliary: No focal liver abnormality is seen. The gallbladder is contracted without evidence of gallstones, gallbladder wall thickening, or biliary dilatation. Pancreas: Unremarkable. No pancreatic ductal dilatation or surrounding inflammatory changes. Spleen: Normal in size without focal abnormality. Adrenals/Urinary Tract: There is mild diffuse enlargement of the left adrenal gland. The right adrenal gland is unremarkable. Kidneys are normal in size, without renal calculi or hydronephrosis. A 2.7 cm simple cyst is seen within the lower pole of the left kidney. A 1.2 cm simple cyst is noted within the medial aspect of the mid right kidney. Bladder is unremarkable. Stomach/Bowel: Stomach is within normal limits. Appendix appears normal. No evidence of bowel dilatation. An area of mesenteric twisting is seen within the medial aspect of the mid to lower left abdomen (axial CT images 210 through 249, CT series  7). As a result, moderately thickened and poorly distended loops of sigmoid colon are seen within the abdomen on the right. Lymphatic: No abnormal abdominal or pelvic lymph nodes are identified. Reproductive: A mild-to-moderate amount of air attenuation is seen within the endometrial canal (axial CT images 242 through 254, CT series 7. The bilateral adnexa are unremarkable. Other: No abdominal wall hernia or abnormality. No abdominopelvic ascites. Musculoskeletal: Multilevel degenerative changes seen throughout the lumbar spine. Review of the MIP images confirms the above findings. IMPRESSION: 1. No evidence of pulmonary embolism. 2. 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 3. Extensive aortic atherosclerosis with tortuosity and 3.5 cm x 3.2 cm aneurysmal dilatation of the infrarenal abdominal aorta. 4. Marked severity narrowing and suspected hemodynamically significant stenosis of the origin of the right renal artery. 5. Area of mesenteric twisting within the medial aspect of the mid to lower left abdomen, with resultant inflamed loops of sigmoid colon noted within the abdomen on the right. While this may represent an internal hernia, follow-up with abdomen and pelvis CT is recommended to exclude the presence of an early sigmoid volvulus. 6. Mild-to-moderate amount of air attenuation within the endometrial canal. Correlation with pelvic ultrasound is recommended. 7. Bilateral simple renal cysts. 8. Aortic atherosclerosis. Electronically Signed   By: Suzen Dials M.D.   On: 07/17/2024 03:40   CT Head Wo Contrast Result Date: 07/17/2024 EXAM: CT HEAD WITHOUT CONTRAST 07/17/2024 02:43:04  AM TECHNIQUE: CT of the head was performed without the administration of intravenous contrast. Automated exposure control, iterative reconstruction, and/or weight based adjustment of the mA/kV was utilized to reduce the radiation dose to as low as  reasonably achievable. COMPARISON: None available. CLINICAL HISTORY: Neuro deficit, acute, stroke suspected FINDINGS: BRAIN AND VENTRICLES: No acute hemorrhage. No evidence of acute large vascular territory infarct. Advanced patchy white matter hypodensities, compatible with chronic microvascular ischemic change. No hydrocephalus. No extra-axial collection. Stable 1 cm left posterior frontal lobe calcified extra-axial mass without mass effect, compatible with meningioma. ORBITS: No acute abnormality. SINUSES: No acute abnormality. SOFT TISSUES AND SKULL: No skull fracture. IMPRESSION: 1. No evidence of acute large vascular territory infarct. 2. Advanced chronic microvascular ischemic change. A white matter infarct could easily be obscured. Recommend low threshold for MRI. 3. Stable 1 cm left posterior frontal lobe meningioma without mass effect. Electronically signed by: Gilmore Molt MD 07/17/2024 03:11 AM EST RP Workstation: [id number]    Anti-infectives: Anti-infectives (From admission, onward)    None        Assessment/Plan Ischemic colitis -appreciate GI for flex sig yesterday.  Unclear etiology of finding, but likely something transient. -no abdominal pain or symptoms -tolerating a soft diet -no plans for surgical intervention -surgically stable to return home when felt medically stable -we will be available as needed  -d/w primary service  FEN - soft VTE - Lovenox  ID - none  I reviewed Consultant GI notes, hospitalist notes, last 24 h vitals and pain scores, last 48 h intake and output, last 24 h labs and trends, and last 24 h imaging results.   LOS: 0 days    Burnard FORBES Banter , [facility] Surgery 07/18/2024, 8:46 AM Please see Amion for pager number during day hours 7:00am-4:30pm or 7:00am -11:30am on weekends

## 2024-07-18 NOTE — Plan of Care (Signed)
 Pt slept well overnight. No BM overnight. Compliant with bowel regimen. No complaints of nausea. Ate almost all her dinner. Fall precautions in place, bed alarm active, non skid socks in place, call bell within reach.   Problem: Education: Goal: Knowledge of General Education information will improve Description: Including pain rating scale, medication(s)/side effects and non-pharmacologic comfort measures Outcome: Progressing   Problem: Health Behavior/Discharge Planning: Goal: Ability to manage health-related needs will improve Outcome: Progressing   Problem: Clinical Measurements: Goal: Ability to maintain clinical measurements within normal limits will improve Outcome: Progressing Goal: Will remain free from infection Outcome: Progressing Goal: Diagnostic test results will improve Outcome: Progressing Goal: Respiratory complications will improve Outcome: Progressing Goal: Cardiovascular complication will be avoided Outcome: Progressing   Problem: Activity: Goal: Risk for activity intolerance will decrease Outcome: Progressing   Problem: Nutrition: Goal: Adequate nutrition will be maintained Outcome: Progressing   Problem: Coping: Goal: Level of anxiety will decrease Outcome: Progressing   Problem: Elimination: Goal: Will not experience complications related to bowel motility Outcome: Progressing Goal: Will not experience complications related to urinary retention Outcome: Progressing   Problem: Pain Managment: Goal: General experience of comfort will improve and/or be controlled Outcome: Progressing   Problem: Safety: Goal: Ability to remain free from injury will improve Outcome: Progressing   Problem: Skin Integrity: Goal: Risk for impaired skin integrity will decrease Outcome: Progressing

## 2024-07-19 DIAGNOSIS — K559 Vascular disorder of intestine, unspecified: Secondary | ICD-10-CM | POA: Diagnosis not present

## 2024-07-19 LAB — CBC
HCT: 39.9 % (ref 36.0–46.0)
Hemoglobin: 12.7 g/dL (ref 12.0–15.0)
MCH: 28.1 pg (ref 26.0–34.0)
MCHC: 31.8 g/dL (ref 30.0–36.0)
MCV: 88.3 fL (ref 80.0–100.0)
Platelets: 244 K/uL (ref 150–400)
RBC: 4.52 MIL/uL (ref 3.87–5.11)
RDW: 14.5 % (ref 11.5–15.5)
WBC: 6.3 K/uL (ref 4.0–10.5)
nRBC: 0 % (ref 0.0–0.2)

## 2024-07-19 LAB — BASIC METABOLIC PANEL WITH GFR
Anion gap: 9 (ref 5–15)
BUN: 15 mg/dL (ref 8–23)
CO2: 26 mmol/L (ref 22–32)
Calcium: 9.1 mg/dL (ref 8.9–10.3)
Chloride: 101 mmol/L (ref 98–111)
Creatinine, Ser: 0.81 mg/dL (ref 0.44–1.00)
GFR, Estimated: >60 mL/min (ref >60)
Glucose, Bld: 90 mg/dL (ref 70–99)
Potassium: 3.7 mmol/L (ref 3.5–5.1)
Sodium: 136 mmol/L (ref 135–145)

## 2024-07-19 MED ORDER — CLOBETASOL PROPIONATE 0.05 % EX CREA
TOPICAL_CREAM | Freq: Two times a day (BID) | CUTANEOUS | Status: DC
  Filled 2024-07-19: qty 15

## 2024-07-19 MED ORDER — POLYETHYLENE GLYCOL 3350 17 G PO PACK
17.0000 g | PACK | Freq: Every day | ORAL | Status: DC
  Administered 2024-07-20 – 2024-07-22 (×3): 17 g via ORAL
  Filled 2024-07-19 (×3): qty 1

## 2024-07-19 MED ORDER — LINACLOTIDE 145 MCG PO CAPS
290.0000 ug | ORAL_CAPSULE | Freq: Every day | ORAL | Status: DC
  Administered 2024-07-20 – 2024-07-22 (×3): 290 ug via ORAL
  Filled 2024-07-19 (×3): qty 2

## 2024-07-19 MED ORDER — DIPHENHYDRAMINE HCL 25 MG PO CAPS
25.0000 mg | ORAL_CAPSULE | Freq: Four times a day (QID) | ORAL | Status: DC | PRN
Start: 2024-07-19 — End: 2024-07-22
  Administered 2024-07-19 – 2024-07-20 (×2): 25 mg via ORAL
  Filled 2024-07-19 (×2): qty 1

## 2024-07-19 NOTE — Progress Notes (Signed)
 PROGRESS NOTE    [name]  FMW:[id number] DOB: [birth year] DOA: 07/16/2024 PCP: Levora Reyes SAUNDERS, MD    Brief Narrative:  Patient is an 78 year old female with PMHx of CVA with right-sided hemiparesis, chronic constipation, PAD, HTN, COPD, CKD stage IIIb, AAA, GERD, anxiety, depression who presented with several hours of acute onset persistent lower abdominal pain associated with nausea and vomiting, which resolved after having a large bowel movement consisting of loose watery stools.  In the ED, she was afebrile and hemodynamically stable.  WBCs 11.9, creatinine 1.33.  CT chest abdomen pelvis showed mesenteric twisting within the medial aspect of the mid to lower abdomen with concern for internal hernia versus early sigmoid volvulus, in addition to severe calcification of the thoracic aorta with no aneurysm dilatation, 3.5 x 3 point centimeter aneurysm dilatation of the infrarenal abdominal aorta, significant stenosis of the right renal artery. She was evaluated by general surgery who indicated the patient's symptoms possibly consistent with sigmoid volvulus and recommended flexible sigmoidoscopy by GI.  She was consequently admitted.    Assessment and Plan:  # Ischemic colitis likely secondary to brief colonic obstruction from self resolved sigmoid volvulus - Presented with acute onset abdominal pain and vomiting, resolved after large bowel movement - CT abdomen pelvis showed mesenteric twisting within the mid to lower abdomen concerning for early sigmoid volvulus versus internal hernia - Flexible sigmoidoscopy showed segmental mucosal changes in descending and sigmoid colon consistent with ischemic colitis; no evidence of acute volvulus or stricture - Currently hemodynamically stable, symptoms resolved - Continue supportive care - Encourage hydration.  Monitor for recurrent abdominal pain, hematochezia, or ileus.   - No bowel movements currently only passing flatus. - Avoid constipation  with aggressive bowel regimen per GI including home Linzess , MiraLAX  17 g p.o. daily, senna 2 tabs p.o. nightly as needed - Surgery and GI signed off  #Chronic constipation/colonic dysmotility - Longstanding tortuous colon predisposing to recurrent obstipation or volvulus - Continue bowel regimen as above - Patient educated on daily fiber, hydration, and avoidance of laxative overuse  #PAD status post femoral-tibial bypass #AAA (3.5 x 3.0 cm infrarenal) - Stable per CT - Continue antiplatelet therapy with Plavix  and aspirin  - Outpatient vascular follow-up recommended for AAA surveillance and renal artery stenosis  #CKD stage IIIb - Baseline creatinine ~ 1.3 - Stable  #HTN - Continue home losartan  and amlodipine   #Depression - Continue home Cymbalta   #History of CVA with residual right-sided weakness - Continue DAPT with aspirin  and Plavix   #COPD - Stable with no acute exacerbation  #Debility - PT OT consulted for evaluation - recommending home with home health - Spoke with patient's son, [name], who expressed significant concern about patient's ability to go back home and was requesting consideration for SNF.  Attempted to explain the patient is currently under observation status and has therapy recommendations for home with home health.  Discussed that I would attempt reengaging therapy for reevaluation given that the patient currently does not have 24/7 aide at home, only 9 AM to 9 PM   DVT prophylaxis: enoxaparin  (LOVENOX ) injection 40 mg Start: 07/17/24 0900   Code Status:   Code Status: Full Code  Family Communication: Discussed with caretaker Arlean at bedside  Disposition Plan: Medically ready for discharge.  Son concerned about safety for discharge home.  Therapy to reevaluate on 11/9. PT Follow up Recs: Home Health Pt11/03/2024 1100  Level of care: Med-Surg  Consultants:  General Surgery, gastroenterology  Procedures:  Flexible  sigmoidoscopy  Antimicrobials: None   Subjective: Patient examined at the bedside today.  She reports mild lower abdominal pain.  Has been tolerating p.o. intake.  Is passing flatus but has not had any bowel movements yet.  Objective: Vitals:   07/19/24 0512 07/19/24 0533 07/19/24 0809 07/19/24 1627  BP: (!) 180/94 (!) 174/88 (!) 152/87 139/86  Pulse: 75 73 72 73  Resp:   20 20  Temp: 97.6 F (36.4 C)  97.8 F (36.6 C) 98.1 F (36.7 C)  TempSrc:      SpO2: 97%  95% 96%  Weight:      Height:        Intake/Output Summary (Last 24 hours) at 07/19/2024 1729 Last data filed at 07/19/2024 1300 Gross per 24 hour  Intake 536 ml  Output 700 ml  Net -164 ml   Filed Weights   07/17/24 1159  Weight: 78.5 kg    Examination:  General exam: Appears calm and comfortable  Respiratory system: Clear to auscultation. Respiratory effort normal. Cardiovascular system: S1 & S2 heard, RRR. No JVD, murmurs. No pedal edema. Gastrointestinal system: Abdomen is nondistended, soft.  Mild TTP at LLQ.  Hyperactive bowel sounds in all 4 quadrants. Central nervous system: Alert and oriented. No focal neurological deficits. Extremities: Symmetric 5 x 5 power. Skin: No rashes, lesions or ulcers Psychiatry: Judgement and insight appear normal. Mood & affect appropriate.    Data Reviewed: I have personally reviewed following labs and imaging studies  CBC: Recent Labs  Lab 07/16/24 2205 07/18/24 0515 07/19/24 1141  WBC 11.9* 7.3 6.3  HGB 14.1 12.0 12.7  HCT 44.9 37.5 39.9  MCV 88.9 87.6 88.3  PLT 296 244 244   Basic Metabolic Panel: Recent Labs  Lab 07/16/24 2205 07/18/24 0515 07/19/24 1141  NA 137 137 136  K 3.8 3.7 3.7  CL 100 103 101  CO2 26 24 26   GLUCOSE 135* 107* 90  BUN 15 14 15   CREATININE 1.33* 0.99 0.81  CALCIUM  9.4 8.9 9.1   GFR: Estimated Creatinine Clearance: 59.8 mL/min (by C-G formula based on SCr of 0.81 mg/dL). Liver Function Tests: Recent Labs  Lab  07/16/24 2205 07/18/24 0515  AST 17 13*  ALT 14 12  ALKPHOS 76 68  BILITOT 0.6 0.3  PROT 7.4 6.2*  ALBUMIN  3.7 3.0*   Recent Labs  Lab 07/16/24 2205  LIPASE 31   No results for input(s): AMMONIA in the last 168 hours. Coagulation Profile: No results for input(s): INR, PROTIME in the last 168 hours. Cardiac Enzymes: No results for input(s): CKTOTAL, CKMB, CKMBINDEX, TROPONINI in the last 168 hours. BNP (last 3 results) No results for input(s): PROBNP in the last 8760 hours. HbA1C: No results for input(s): HGBA1C in the last 72 hours. CBG: No results for input(s): GLUCAP in the last 168 hours. Lipid Profile: No results for input(s): CHOL, HDL, LDLCALC, TRIG, CHOLHDL, LDLDIRECT in the last 72 hours. Thyroid  Function Tests: No results for input(s): TSH, T4TOTAL, FREET4, T3FREE, THYROIDAB in the last 72 hours. Anemia Panel: No results for input(s): VITAMINB12, FOLATE, FERRITIN, TIBC, IRON, RETICCTPCT in the last 72 hours. Sepsis Labs: No results for input(s): PROCALCITON, LATICACIDVEN in the last 168 hours.  No results found for this or any previous visit (from the past 240 hours).   Radiology Studies: No results found.   Scheduled Meds:  amLODipine   5 mg Oral Daily   aspirin  EC  81 mg Oral Daily   clonazePAM   0.5 mg Oral QHS  clopidogrel   75 mg Oral Daily   DULoxetine   30 mg Oral QHS   DULoxetine   60 mg Oral Daily   enoxaparin  (LOVENOX ) injection  40 mg Subcutaneous Q24H   losartan   50 mg Oral Daily   ondansetron  (ZOFRAN ) IV  4 mg Intravenous Once   polyethylene glycol  17 g Oral BID   rOPINIRole   2 mg Oral QHS   Continuous Infusions:   LOS: 0  Time Spent: 45 minutes  Unresulted Labs (From admission, onward)     Start     Ordered   07/17/24 0058  Urinalysis, w/ Reflex to Culture (Infection Suspected) -Urine, Clean Catch  Once,   URGENT       Question:  Specimen Source  Answer:  Urine, Clean Catch    07/17/24 0058             Duffy Larch, MD [facility]  If 7PM-7AM, please contact night-coverage  07/19/2024, 5:29 PM

## 2024-07-19 NOTE — Plan of Care (Signed)

## 2024-07-19 NOTE — Plan of Care (Signed)

## 2024-07-19 NOTE — TOC Progression Note (Addendum)
 Transition of Care [facility]) - Initial/Assessment Note    Patient Details  Name: [name] MRN: [id number] Date of Birth: [birth year]  Transition of Care [facility]) CM/SW Contact:    Britt JULIANNA Bennetts, LCSW Phone Number: 07/19/2024, 2:54 PM  Clinical Narrative:                 CSW received consult due to pt's son requesting SNF placement and contacted pt's son, [name].  CSW attempted to explain the SNF process and why patient does not meet Medicare criteria for SNF at this time.   The son stated This is bullshit.  Did you evaluate her?  It was not the insurance's criteria. It was you putting her as that status and you are discriminating against her because she is white. She cannot go home she is weak.  The only thing you can do is tell the doctor to send her to a facility.  I do not want to speak with you.  I only want to speak with the doctor.    CSW informed MD of son's request.  16:50-  CSW contacted PT at the request of MD, to inquire as to whether the patient can be re-evaluated prior to possible discharge tomorrow.  PT confirmed that patient can be seen tomorrow.       Patient Goals and CMS Choice            Expected Discharge Plan and Services                                              Prior Living Arrangements/Services                       Activities of Daily Living   ADL Screening (condition at time of admission) Independently performs ADLs?: No Is the patient deaf or have difficulty hearing?: No Does the patient have difficulty seeing, even when wearing glasses/contacts?: No Does the patient have difficulty concentrating, remembering, or making decisions?: No  Permission Sought/Granted                  Emotional Assessment              Admission diagnosis:  Generalized abdominal pain [R10.84] Abdominal pain [R10.9] Patient Active Problem List   Diagnosis Date Noted   Sigmoid volvulus (HCC) 07/18/2024   Ischemic colitis 07/17/2024    Abnormal CT scan, colon 07/17/2024   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) 07/16/2023   AAA (abdominal aortic aneurysm) 07/16/2023   Thoracic ascending aortic aneurysm 07/16/2023   CVA (cerebral vascular accident) (HCC) 03/03/2023   Elevated troponin 03/03/2023   Hypertensive urgency 03/03/2023   CKD (chronic kidney disease), stage III (HCC) 03/03/2023   Psoriasis 03/03/2023   Centrilobular emphysema (HCC) 10/19/2020   Chronic respiratory failure with hypoxia (HCC) 12/26/2019   Shortness of breath 12/26/2019   Bilateral pulmonary infiltrates on CXR    Complicated UTI (urinary tract infection) 11/11/2019   AKI (acute kidney injury) 11/11/2019   Hyponatremia 11/11/2019   Elevated LFTs 11/11/2019   Wound infection 11/07/2018   Postoperative pain    Sleep disturbance    Tobacco abuse    Acute blood loss anemia    Hypoalbuminemia due to protein-calorie malnutrition    Debility 10/24/2018   Pre-operative cardiovascular examination  09/26/2018   HLD (hyperlipidemia) 09/26/2018   GAD (generalized anxiety disorder) 08/07/2018   Major depressive disorder, single episode 08/07/2018   Appendiceal abscess 07/08/2018   PVD (peripheral vascular disease) 03/15/2018   Atherosclerosis of native artery of left lower extremity with intermittent claudication 03/15/2018   Chronic left-sided low back pain with left-sided sciatica 12/25/2017   Facial droop    History of CVA (cerebrovascular accident) 10/23/2017   Critical lower limb ischemia (HCC) 11/08/2016   Smoker 11/08/2016   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 01/16/2014   Mild cognitive impairment 01/16/2014   Meningioma (HCC) 10/21/2013   Chest pain 10/20/2013   Medicare annual wellness visit, subsequent 06/16/2013   Dizziness and giddiness 06/16/2013   RLS (restless legs syndrome) 04/29/2012   Abdominal pain  12/27/2010   Osteoarthritis 09/23/2010   CAROTID ARTERY DISEASE 08/15/2010   CHEST PAIN 08/15/2010   Headache 12/02/2009   Backache 11/22/2009   CONSTIPATION, CHRONIC 01/29/2008   NAUSEA 01/28/2008   Depression 03/08/2007   HTN (hypertension) 03/08/2007   PCP:  Levora Reyes SAUNDERS, MD Pharmacy:   [facility] [phone] [place], [place] - [address] AT [facility] [address] [place] [address] Phone: [phone] Fax: [phone]     Social Drivers of Health (SDOH) Social History: SDOH Screenings   Food Insecurity: No Food Insecurity (07/17/2024)  Housing: Low Risk  (07/17/2024)  Transportation Needs: No Transportation Needs (07/17/2024)  Utilities: Not At Risk (07/17/2024)  Alcohol Screen: Low Risk  (06/13/2023)  Depression (PHQ2-9): High Risk (06/13/2023)  Financial Resource Strain: Low Risk  (03/23/2021)  Physical Activity: Inactive (06/13/2023)  Social Connections: Unknown (07/17/2024)  Stress: Stress Concern Present (06/13/2023)  Tobacco Use: High Risk (07/17/2024)  Health Literacy: Adequate Health Literacy (06/13/2023)   SDOH Interventions:     Readmission Risk Interventions     No data to display

## 2024-07-20 DIAGNOSIS — K559 Vascular disorder of intestine, unspecified: Secondary | ICD-10-CM | POA: Diagnosis not present

## 2024-07-20 MED ORDER — SENNOSIDES-DOCUSATE SODIUM 8.6-50 MG PO TABS: 2.0000 | ORAL_TABLET | Freq: Every evening | ORAL | 0 refills | Status: AC | PRN

## 2024-07-20 MED ORDER — POLYETHYLENE GLYCOL 3350 17 G PO PACK: 17.0000 g | PACK | Freq: Every day | ORAL | Status: AC

## 2024-07-20 MED ORDER — CLONAZEPAM 0.5 MG PO TABS: 0.5000 mg | ORAL_TABLET | Freq: Every day | ORAL | 0 refills | Status: AC

## 2024-07-20 NOTE — NC FL2 (Signed)
 [place]  MEDICAID FL2 LEVEL OF CARE FORM     IDENTIFICATION  Patient Name: [name] Birthdate: 07-29-1946 Sex: female Admission Date (Current Location): 07/16/2024  [facility] and [place] Number:  Producer, Television/film/video and Address:  [facility], [address], [place], [place] [address]      Provider Number: [id number]  Attending Physician Name and Address:  Caleen Burgess BROCKS, MD  Relative Name and Phone Number:  [place],[name] Vidal)  [phone]    Current Level of Care: Hospital Recommended Level of Care: Skilled Nursing Facility Prior Approval Number:    Date Approved/Denied:   PASRR Number: [id number]  Discharge Plan: SNF    Current Diagnoses: Patient Active Problem List   Diagnosis Date Noted   Sigmoid volvulus (HCC) 07/18/2024   Ischemic colitis 07/17/2024   Abnormal CT scan, colon 07/17/2024   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) 07/16/2023   AAA (abdominal aortic aneurysm) 07/16/2023   Thoracic ascending aortic aneurysm 07/16/2023   CVA (cerebral vascular accident) (HCC) 03/03/2023   Elevated troponin 03/03/2023   Hypertensive urgency 03/03/2023   CKD (chronic kidney disease), stage III (HCC) 03/03/2023   Psoriasis 03/03/2023   Centrilobular emphysema (HCC) 10/19/2020   Chronic respiratory failure with hypoxia (HCC) 12/26/2019   Shortness of breath 12/26/2019   Bilateral pulmonary infiltrates on CXR    Complicated UTI (urinary tract infection) 11/11/2019   AKI (acute kidney injury) 11/11/2019   Hyponatremia 11/11/2019   Elevated LFTs 11/11/2019   Wound infection 11/07/2018   Postoperative pain    Sleep disturbance    Tobacco abuse    Acute blood loss anemia    Hypoalbuminemia due to protein-calorie malnutrition    Debility 10/24/2018   Pre-operative cardiovascular examination 09/26/2018   HLD (hyperlipidemia) 09/26/2018   GAD (generalized anxiety disorder)  08/07/2018   Major depressive disorder, single episode 08/07/2018   Appendiceal abscess 07/08/2018   PVD (peripheral vascular disease) 03/15/2018   Atherosclerosis of native artery of left lower extremity with intermittent claudication 03/15/2018   Chronic left-sided low back pain with left-sided sciatica 12/25/2017   Facial droop    History of CVA (cerebrovascular accident) 10/23/2017   Critical lower limb ischemia (HCC) 11/08/2016   Smoker 11/08/2016   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 01/16/2014   Mild cognitive impairment 01/16/2014   Meningioma (HCC) 10/21/2013   Chest pain 10/20/2013   Medicare annual wellness visit, subsequent 06/16/2013   Dizziness and giddiness 06/16/2013   RLS (restless legs syndrome) 04/29/2012   Abdominal pain 12/27/2010   Osteoarthritis 09/23/2010   CAROTID ARTERY DISEASE 08/15/2010   CHEST PAIN 08/15/2010   Headache 12/02/2009   Backache 11/22/2009   CONSTIPATION, CHRONIC 01/29/2008   NAUSEA 01/28/2008   Depression 03/08/2007   HTN (hypertension) 03/08/2007    Orientation RESPIRATION BLADDER Height & Weight     Place, Situation, Self  Normal Continent Weight: 173 lb (78.5 kg) Height:  5' 7 (170.2 cm)  BEHAVIORAL SYMPTOMS/MOOD NEUROLOGICAL BOWEL NUTRITION STATUS      Incontinent Diet (see d/c summary)  AMBULATORY STATUS COMMUNICATION OF NEEDS Skin   Total Care Verbally Normal                       Personal Care Assistance Level of Assistance  Bathing, Feeding, Dressing Bathing Assistance: Maximum assistance Feeding assistance: Limited assistance (set up) Dressing Assistance: Maximum assistance  Functional Limitations Info  Sight, Hearing, Speech Sight Info: Adequate Hearing Info: Impaired Speech Info: Adequate    SPECIAL CARE FACTORS FREQUENCY  OT (By licensed OT), PT (By licensed PT)     PT Frequency: 5/ week OT Frequency: 5x/ week             Contractures Contractures Info: Not present    Additional Factors Info  Code Status, Allergies, Psychotropic Code Status Info: FULL Allergies Info: Crestor  (Rosuvastatin )  Lipitor (Atorvastatin )  Pravastatin Psychotropic Info: see d/c  med list         Current Medications (07/20/2024):  This is the current hospital active medication list Current Facility-Administered Medications  Medication Dose Route Frequency Provider Last Rate Last Admin   acetaminophen  (TYLENOL ) tablet 650 mg  650 mg Oral Q6H PRN Newton, Steven J, MD       Or   acetaminophen  (TYLENOL ) suppository 650 mg  650 mg Rectal Q6H PRN Eldonna Elspeth PARAS, MD       amLODipine  (NORVASC ) tablet 5 mg  5 mg Oral Daily Al-Sultani, Anmar, MD   5 mg at 07/19/24 0907   aspirin  EC tablet 81 mg  81 mg Oral Daily Al-Sultani, Anmar, MD   81 mg at 07/19/24 0907   clobetasol  cream (TEMOVATE ) 0.05 %   Topical BID Mansy, Jan A, MD   Given at 07/20/24 9166   clonazePAM  (KLONOPIN ) tablet 0.5 mg  0.5 mg Oral QHS Mansy, Jan A, MD   0.5 mg at 07/19/24 2159   clopidogrel  (PLAVIX ) tablet 75 mg  75 mg Oral Daily Al-Sultani, Anmar, MD   75 mg at 07/19/24 0907   diphenhydrAMINE (BENADRYL) capsule 25 mg  25 mg Oral Q6H PRN Mansy, Jan A, MD   25 mg at 07/19/24 2159   diphenhydrAMINE-zinc acetate (BENADRYL) 2-0.1 % cream   Topical Daily PRN Griselda Norris, MD   Given at 07/17/24 1623   DULoxetine  (CYMBALTA ) DR capsule 30 mg  30 mg Oral QHS Al-Sultani, Anmar, MD   30 mg at 07/19/24 2159   DULoxetine  (CYMBALTA ) DR capsule 60 mg  60 mg Oral Daily Al-Sultani, Anmar, MD   60 mg at 07/19/24 0907   enoxaparin  (LOVENOX ) injection 40 mg  40 mg Subcutaneous Q24H Eldonna Elspeth PARAS, MD   40 mg at 07/20/24 9165   iohexol  (OMNIPAQUE ) 350 MG/ML injection 100 mL  100 mL Intravenous Once PRN Griselda Norris, MD       linaclotide  (LINZESS ) capsule 290 mcg  290 mcg Oral QAC breakfast Al-Sultani, Anmar, MD   290 mcg at 07/20/24 0534   losartan  (COZAAR ) tablet 50 mg  50 mg  Oral Daily Al-Sultani, Anmar, MD   50 mg at 07/19/24 9092   ondansetron  (ZOFRAN ) injection 4 mg  4 mg Intravenous Once Griselda Norris, MD       ondansetron  (ZOFRAN ) tablet 4 mg  4 mg Oral Q6H PRN Eldonna Elspeth PARAS, MD       Or   ondansetron  (ZOFRAN ) injection 4 mg  4 mg Intravenous Q6H PRN Newton, Steven J, MD       polyethylene glycol (MIRALAX  / GLYCOLAX ) packet 17 g  17 g Oral Daily Al-Sultani, Anmar, MD   17 g at 07/20/24 0834   rOPINIRole  (REQUIP ) tablet 2 mg  2 mg Oral QHS Mansy, Jan A, MD   2 mg at 07/19/24 2158   senna-docusate (Senokot-S) tablet 2 tablet  2 tablet Oral QHS PRN Al-Sultani, Anmar, MD         Discharge  Medications: Please see discharge summary for a list of discharge medications.  Relevant Imaging Results:  Relevant Lab Results:   Additional Information SSN [id number]  (patient is eligible for [facility] 3-Day waiver)  Britt JULIANNA Bennetts, LCSW

## 2024-07-20 NOTE — Progress Notes (Signed)
 Physical Therapy Treatment Patient Details Name: [name] MRN: [id number] DOB: 10-01-1945 Today's Date: 07/20/2024   History of Present Illness Pt is a 78 y.o. female presents to ED 11/06 via EMS  sudden onset of generalized lower abdominal pain overnight with nausea vomiting as well as increased bowel movements. 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 Reported her caregiver was supposed to be present at 11am and she has been unable to get into contact with her son. She stated, When you need help is when you can't get any. Reported she only has caregiver assistance for certain parts of the day and not consistent with arrival times. Family not present to confirm patient's prior home set up and assist. Patient is poor historian as well. Patient currently requires modA for bed mobility and CGA-modA for seated balance at EOB with tendency for R lateral trunk leaning. Participated in two sit to stand transfers with maxA. Unable to participate in stand pivot transfers which has been her baseline most recently, per patient. PT updating recommendations to post-acute rehab < 3 hours/day secondary to current mobility status and unsure of true home situation. If it were confirmed patient had 24/7 supervision/assistance, patient could transition home with adequate physical assistance for transfers, currently +2).    If plan is discharge home, recommend the following: Assistance with cooking/housework;Direct supervision/assist for medications management;Assistance with feeding;Direct supervision/assist for financial  management;Assist for transportation;Help with stairs or ramp for entrance;Supervision due to cognitive status;Two people to help with walking and/or transfers;Two people to help with bathing/dressing/bathroom   Can travel by private vehicle     No  Equipment Recommendations  None recommended by PT    Recommendations for Other Services       Precautions / Restrictions Precautions Precautions: Fall Restrictions Weight Bearing Restrictions Per Provider Order: No     Mobility  Bed Mobility Overal bed mobility: Needs Assistance Bed Mobility: Supine to Sit, Sit to Supine     Supine to sit: Mod assist Sit to supine: Max assist   General bed mobility comments: poor seqeuncing for bed mobility; modA for trunk righting; maxA for trunk lowering and assist for B LE onto bed; mod-max cues at all times    Transfers Overall transfer level: Needs assistance Equipment used: None Transfers: Sit to/from Stand Sit to Stand: Max assist           General transfer comment: maxA for 2 standing transfers at EOB with continued mod-maxA for stability. Unable to participate in step taking or stand pivot.    Ambulation/Gait                   Stairs             Wheelchair Mobility     Tilt Bed    Modified Rankin (Stroke Patients Only)       Balance Overall balance assessment: Needs assistance Sitting-balance support: Bilateral upper extremity supported Sitting balance-Leahy Scale: Poor Sitting balance - Comments: Frequent cues for seated balance to avoid R lateral trunk lean (difficulty correcting) CGA for 30 seconds at best, up to modA for trunk stability Postural control: Right lateral lean   Standing balance-Leahy Scale: Zero Standing balance  comment: mod-maxA in standing at all times; able to tolerate < 10 seconds                            Communication Communication Communication: Impaired Factors Affecting Communication: Reduced clarity of  speech  Cognition Arousal: Alert Behavior During Therapy: Anxious, Lability   PT - Cognitive impairments: Difficult to assess, No family/caregiver present to determine baseline, Sequencing                       PT - Cognition Comments: Patient with difficulty following conversation due to anxiety and emotional lability. Following commands: Intact      Cueing Cueing Techniques: Verbal cues, Gestural cues, Tactile cues, Visual cues  Exercises      General Comments General comments (skin integrity, edema, etc.): Seated EOB for ~10 minutes with consistent cues for trunk stability.      Pertinent Vitals/Pain Pain Assessment Pain Assessment: No/denies pain    Home Living                          Prior Function            PT Goals (current goals can now be found in the care plan section) Progress towards PT goals: Progressing toward goals    Frequency    Min 3X/week      PT Plan      Co-evaluation              AM-PAC PT 6 Clicks Mobility   Outcome Measure  Help needed turning from your back to your side while in a flat bed without using bedrails?: A Little Help needed moving from lying on your back to sitting on the side of a flat bed without using bedrails?: A Lot Help needed moving to and from a bed to a chair (including a wheelchair)?: Total Help needed standing up from a chair using your arms (e.g., wheelchair or bedside chair)?: Total Help needed to walk in hospital room?: Total Help needed climbing 3-5 steps with a railing? : Total 6 Click Score: 9    End of Session Equipment Utilized During Treatment: Gait belt Activity Tolerance: Patient tolerated treatment well Patient left: in bed;with call bell/phone within reach;with bed alarm set Nurse Communication: Mobility status PT Visit Diagnosis: Unsteadiness on feet (R26.81);Other abnormalities of gait and mobility (R26.89);Muscle weakness (generalized) (M62.81);Difficulty in  walking, not elsewhere classified (R26.2);Other symptoms and signs involving the nervous system (R29.898);Hemiplegia and hemiparesis Hemiplegia - Right/Left: Right     Time: 1130-1203 PT Time Calculation (min) (ACUTE ONLY): 33 min  Charges:    $Therapeutic Activity: 23-37 mins PT General Charges $$ ACUTE PT VISIT: 1 Visit                     Sherryle [place], PT, DPT [facility] Acute Rehabilitation Office: [phone]    Sherryle VEAR [place] 07/20/2024, 12:53 PM

## 2024-07-20 NOTE — Plan of Care (Signed)
  Problem: Education: Goal: Knowledge of General Education information will improve Description: Including pain rating scale, medication(s)/side effects and non-pharmacologic comfort measures Outcome: Progressing   Problem: Clinical Measurements: Goal: Will remain free from infection Outcome: Progressing Goal: Respiratory complications will improve Outcome: Progressing Goal: Cardiovascular complication will be avoided Outcome: Progressing   Problem: Nutrition: Goal: Adequate nutrition will be maintained Outcome: Progressing   Problem: Health Behavior/Discharge Planning: Goal: Ability to manage health-related needs will improve Outcome: Not Progressing   Problem: Clinical Measurements: Goal: Ability to maintain clinical measurements within normal limits will improve Outcome: Not Progressing   Problem: Activity: Goal: Risk for activity intolerance will decrease Outcome: Not Progressing   Problem: Coping: Goal: Level of anxiety will decrease Outcome: Not Progressing

## 2024-07-20 NOTE — TOC Progression Note (Signed)
 Transition of Care [facility]) - Initial/Assessment Note    Patient Details  Name: [name] MRN: [id number] Date of Birth: [birth year]  [facility] CM/SW Contact:    Britt JULIANNA Bennetts, LCSW Phone Number: 07/20/2024, 1:55 PM  Clinical Narrative:                 PT recommendation updated to SNF.   CSW verified that patient qualifies for [facility] SNF waiver and faxed referral to participating facilities.    Pending bed offers.     Patient Goals and CMS Choice            Expected Discharge Plan and Services         Expected Discharge Date: 07/18/24                                    Prior Living Arrangements/Services                       Activities of Daily Living   ADL Screening (condition at time of admission) Independently performs ADLs?: No Is the patient deaf or have difficulty hearing?: No Does the patient have difficulty seeing, even when wearing glasses/contacts?: No Does the patient have difficulty concentrating, remembering, or making decisions?: No  Permission Sought/Granted                  Emotional Assessment              Admission diagnosis:  Generalized abdominal pain [R10.84] Abdominal pain [R10.9] Patient Active Problem List   Diagnosis Date Noted   Sigmoid volvulus (HCC) 07/18/2024   Ischemic colitis 07/17/2024   Abnormal CT scan, colon 07/17/2024   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) 07/16/2023   AAA (abdominal aortic aneurysm) 07/16/2023   Thoracic ascending aortic aneurysm 07/16/2023   CVA (cerebral vascular accident) (HCC) 03/03/2023   Elevated troponin 03/03/2023   Hypertensive urgency 03/03/2023   CKD (chronic kidney disease), stage III (HCC) 03/03/2023   Psoriasis 03/03/2023   Centrilobular emphysema (HCC) 10/19/2020   Chronic respiratory failure with hypoxia (HCC) 12/26/2019   Shortness of breath 12/26/2019   Bilateral pulmonary  infiltrates on CXR    Complicated UTI (urinary tract infection) 11/11/2019   AKI (acute kidney injury) 11/11/2019   Hyponatremia 11/11/2019   Elevated LFTs 11/11/2019   Wound infection 11/07/2018   Postoperative pain    Sleep disturbance    Tobacco abuse    Acute blood loss anemia    Hypoalbuminemia due to protein-calorie malnutrition    Debility 10/24/2018   Pre-operative cardiovascular examination 09/26/2018   HLD (hyperlipidemia) 09/26/2018   GAD (generalized anxiety disorder) 08/07/2018   Major depressive disorder, single episode 08/07/2018   Appendiceal abscess 07/08/2018   PVD (peripheral vascular disease) 03/15/2018   Atherosclerosis of native artery of left lower extremity with intermittent claudication 03/15/2018   Chronic left-sided low back pain with left-sided sciatica 12/25/2017   Facial droop    History of CVA (cerebrovascular accident) 10/23/2017   Critical lower limb ischemia (HCC) 11/08/2016   Smoker 11/08/2016   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 01/16/2014   Mild cognitive impairment 01/16/2014   Meningioma (HCC) 10/21/2013   Chest pain 10/20/2013   Medicare annual wellness visit, subsequent 06/16/2013   Dizziness and giddiness 06/16/2013  RLS (restless legs syndrome) 04/29/2012   Abdominal pain 12/27/2010   Osteoarthritis 09/23/2010   CAROTID ARTERY DISEASE 08/15/2010   CHEST PAIN 08/15/2010   Headache 12/02/2009   Backache 11/22/2009   CONSTIPATION, CHRONIC 01/29/2008   NAUSEA 01/28/2008   Depression 03/08/2007   HTN (hypertension) 03/08/2007   PCP:  Levora Reyes SAUNDERS, MD Pharmacy:   [facility] [phone] [place], [place] - [address] AT [facility] [address] [place] [address] Phone: [phone] Fax: [phone]     Social Drivers of Health (SDOH) Social History: SDOH Screenings   Food Insecurity: No Food Insecurity (07/17/2024)   Housing: Low Risk  (07/17/2024)  Transportation Needs: No Transportation Needs (07/17/2024)  Utilities: Not At Risk (07/17/2024)  Alcohol Screen: Low Risk  (06/13/2023)  Depression (PHQ2-9): High Risk (06/13/2023)  Financial Resource Strain: Low Risk  (03/23/2021)  Physical Activity: Inactive (06/13/2023)  Social Connections: Unknown (07/17/2024)  Stress: Stress Concern Present (06/13/2023)  Tobacco Use: High Risk (07/17/2024)  Health Literacy: Adequate Health Literacy (06/13/2023)   SDOH Interventions:     Readmission Risk Interventions     No data to display

## 2024-07-20 NOTE — Progress Notes (Addendum)
 PROGRESS NOTE    [name]  FMW:[id number] DOB: [birth year] DOA: 07/16/2024 PCP: Levora Reyes SAUNDERS, MD    Brief Narrative:   78 year old with history of CVA with right-sided hemiparesis, chronic constipation, PAD, HTN, COPD, CKD 3B, GERD, AAA, anxiety, depression admitted for abdominal pain with concerns of constipation which improved after bowel regimen.  CT also revealed internal hernia versus sigmoid colon.  Eventually flex sig revealed ischemic colitis and advised to continue bowel regimen.  General surgery and GI signed off.  Currently working on safe disposition with TOC and therapy staff.  Assessment & Plan:  Abdominal pain secondary to severe constipation and ischemic colitis Colonic dysmotility/chronic constipation Constipation improved with bowel regimen along with abdominal pain.  CT also revealed internal hernia versus sigmoid colon.  Eventually flex sig on 11/6 revealed ischemic colitis and advised to continue bowel regimen.  General surgery and GI signed off   History of PAD with femoral/tibial bypass AAA -Continue aspirin , Plavix , statin and outpatient follow-up with vascular   CKD stage IIIb -Creatinine around baseline 1.2   Essential hypertension, stable -Norvasc , losartan    Depression -Cymbalta    Prior history of CVA with residual right-sided weakness -On aspirin  Plavix  and statin.  Follows with neurology at [facility]   COPD -As needed bronchodilators   Debility -Will require repeat PT/OT evaluation     DVT prophylaxis: Lovenox     Code Status: Full Code Family Communication:  Called [name] No answer.  Pending therapy evaluation for final placement versus safe disposition   PT Follow up Recs: Home Health Pt11/03/2024 1100  Subjective: Seen at bedside no complaints   Examination:  General exam: Appears calm and comfortable  Respiratory system: Clear to auscultation. Respiratory effort normal. Cardiovascular system: S1 & S2 heard, RRR. No JVD,  murmurs, rubs, gallops or clicks. No pedal edema. Gastrointestinal system: Abdomen is nondistended, soft and nontender. No organomegaly or masses felt. Normal bowel sounds heard. Central nervous system: Alert and oriented. No focal neurological deficits. Extremities: Symmetric 5 x 5 power. Skin: No rashes, lesions or ulcers Psychiatry: Judgement and insight appear normal. Mood & affect appropriate.                Diet Orders (From admission, onward)     Start     Ordered   07/20/24 0000  Diet - low sodium heart healthy        07/20/24 0909   07/17/24 1321  DIET SOFT Fluid consistency: Thin  Diet effective now       Question:  Fluid consistency:  Answer:  Thin   07/17/24 1320            Objective: Vitals:   07/19/24 1627 07/19/24 1935 07/20/24 0520 07/20/24 0822  BP: 139/86 126/67 132/73 (!) 141/70  Pulse: 73 71 65 (!) 59  Resp: 20 (!) 22 17 20   Temp: 98.1 F (36.7 C) 98.8 F (37.1 C) 97.7 F (36.5 C) 98.5 F (36.9 C)  TempSrc:   Oral   SpO2: 96% 95% 98% 95%  Weight:      Height:        Intake/Output Summary (Last 24 hours) at 07/20/2024 1050 Last data filed at 07/20/2024 0500 Gross per 24 hour  Intake 236 ml  Output 1600 ml  Net -1364 ml   Filed Weights   07/17/24 1159  Weight: 78.5 kg    Scheduled Meds:  amLODipine   5 mg Oral Daily   aspirin  EC  81 mg Oral Daily   clobetasol  cream  Topical BID   clonazePAM   0.5 mg Oral QHS   clopidogrel   75 mg Oral Daily   DULoxetine   30 mg Oral QHS   DULoxetine   60 mg Oral Daily   enoxaparin  (LOVENOX ) injection  40 mg Subcutaneous Q24H   linaclotide   290 mcg Oral QAC breakfast   losartan   50 mg Oral Daily   ondansetron  (ZOFRAN ) IV  4 mg Intravenous Once   polyethylene glycol  17 g Oral Daily   rOPINIRole   2 mg Oral QHS   Continuous Infusions:  Nutritional status     Body mass index is 27.1 kg/m.  Data Reviewed:   CBC: Recent Labs  Lab 07/16/24 2205 07/18/24 0515 07/19/24 1141  WBC 11.9*  7.3 6.3  HGB 14.1 12.0 12.7  HCT 44.9 37.5 39.9  MCV 88.9 87.6 88.3  PLT 296 244 244   Basic Metabolic Panel: Recent Labs  Lab 07/16/24 2205 07/18/24 0515 07/19/24 1141  NA 137 137 136  K 3.8 3.7 3.7  CL 100 103 101  CO2 26 24 26   GLUCOSE 135* 107* 90  BUN 15 14 15   CREATININE 1.33* 0.99 0.81  CALCIUM  9.4 8.9 9.1   GFR: Estimated Creatinine Clearance: 59.8 mL/min (by C-G formula based on SCr of 0.81 mg/dL). Liver Function Tests: Recent Labs  Lab 07/16/24 2205 07/18/24 0515  AST 17 13*  ALT 14 12  ALKPHOS 76 68  BILITOT 0.6 0.3  PROT 7.4 6.2*  ALBUMIN  3.7 3.0*   Recent Labs  Lab 07/16/24 2205  LIPASE 31   No results for input(s): AMMONIA in the last 168 hours. Coagulation Profile: No results for input(s): INR, PROTIME in the last 168 hours. Cardiac Enzymes: No results for input(s): CKTOTAL, CKMB, CKMBINDEX, TROPONINI in the last 168 hours. BNP (last 3 results) No results for input(s): PROBNP in the last 8760 hours. HbA1C: No results for input(s): HGBA1C in the last 72 hours. CBG: No results for input(s): GLUCAP in the last 168 hours. Lipid Profile: No results for input(s): CHOL, HDL, LDLCALC, TRIG, CHOLHDL, LDLDIRECT in the last 72 hours. Thyroid  Function Tests: No results for input(s): TSH, T4TOTAL, FREET4, T3FREE, THYROIDAB in the last 72 hours. Anemia Panel: No results for input(s): VITAMINB12, FOLATE, FERRITIN, TIBC, IRON, RETICCTPCT in the last 72 hours. Sepsis Labs: No results for input(s): PROCALCITON, LATICACIDVEN in the last 168 hours.  No results found for this or any previous visit (from the past 240 hours).       Radiology Studies: No results found.         LOS: 0 days   Time spent= 35 mins    Burgess JAYSON Dare, MD [facility]  If 7PM-7AM, please contact night-coverage  07/20/2024, 10:50 AM

## 2024-07-20 NOTE — Progress Notes (Signed)
 RNCM spoke with patient's son regarding disposition.  Patient's son wants to pursue  SNF for his Mother at this time.

## 2024-07-20 NOTE — Plan of Care (Signed)
  Problem: Education: Goal: Knowledge of General Education information will improve Description: Including pain rating scale, medication(s)/side effects and non-pharmacologic comfort measures 07/20/2024 2352 by Jennine Kiki HERO, RN Outcome: Progressing 07/20/2024 2352 by Jennine Kiki HERO, RN Outcome: Progressing   Problem: Health Behavior/Discharge Planning: Goal: Ability to manage health-related needs will improve 07/20/2024 2352 by Jennine Kiki HERO, RN Outcome: Progressing 07/20/2024 2352 by Jennine Kiki HERO, RN Outcome: Progressing   Problem: Clinical Measurements: Goal: Ability to maintain clinical measurements within normal limits will improve 07/20/2024 2352 by Jennine Kiki HERO, RN Outcome: Progressing 07/20/2024 2352 by Jennine Kiki HERO, RN Outcome: Progressing Goal: Will remain free from infection 07/20/2024 2352 by Jennine Kiki HERO, RN Outcome: Progressing 07/20/2024 2352 by Jennine Kiki HERO, RN Outcome: Progressing Goal: Diagnostic test results will improve 07/20/2024 2352 by Jennine Kiki HERO, RN Outcome: Progressing 07/20/2024 2352 by Jennine Kiki HERO, RN Outcome: Progressing Goal: Respiratory complications will improve 07/20/2024 2352 by Jennine Kiki HERO, RN Outcome: Progressing 07/20/2024 2352 by Jennine Kiki HERO, RN Outcome: Progressing Goal: Cardiovascular complication will be avoided 07/20/2024 2352 by Jennine Kiki HERO, RN Outcome: Progressing 07/20/2024 2352 by Jennine Kiki HERO, RN Outcome: Progressing   Problem: Activity: Goal: Risk for activity intolerance will decrease 07/20/2024 2352 by Jennine Kiki HERO, RN Outcome: Progressing 07/20/2024 2352 by Jennine Kiki HERO, RN Outcome: Progressing   Problem: Nutrition: Goal: Adequate nutrition will be maintained 07/20/2024 2352 by Jennine Kiki HERO, RN Outcome: Progressing 07/20/2024 2352 by Jennine Kiki HERO, RN Outcome: Progressing   Problem: Coping: Goal: Level of anxiety will decrease 07/20/2024  2352 by Jennine Kiki HERO, RN Outcome: Progressing 07/20/2024 2352 by Jennine Kiki HERO, RN Outcome: Progressing   Problem: Elimination: Goal: Will not experience complications related to bowel motility 07/20/2024 2352 by Jennine Kiki HERO, RN Outcome: Progressing 07/20/2024 2352 by Jennine Kiki HERO, RN Outcome: Progressing Goal: Will not experience complications related to urinary retention 07/20/2024 2352 by Jennine Kiki HERO, RN Outcome: Progressing 07/20/2024 2352 by Jennine Kiki HERO, RN Outcome: Progressing   Problem: Pain Managment: Goal: General experience of comfort will improve and/or be controlled Outcome: Progressing   Problem: Safety: Goal: Ability to remain free from injury will improve Outcome: Progressing   Problem: Skin Integrity: Goal: Risk for impaired skin integrity will decrease Outcome: Progressing

## 2024-07-20 NOTE — Care Management (Addendum)
 Spoke w Cornel [phone]  They verified patient has HH aid services   Monday to Thursday from 9am to 4pm, and  Fri Sat Sun 9am- 9pm.

## 2024-07-21 DIAGNOSIS — K559 Vascular disorder of intestine, unspecified: Secondary | ICD-10-CM | POA: Diagnosis not present

## 2024-07-21 MED ORDER — SODIUM CHLORIDE 0.9 % IV BOLUS: 500.0000 mL | Freq: Once | INTRAVENOUS | Status: DC

## 2024-07-21 NOTE — Progress Notes (Signed)
 PROGRESS NOTE    [name]  FMW:[id number] DOB: 07-04-1946 DOA: 07/16/2024 PCP: Levora Reyes SAUNDERS, MD    Brief Narrative:   78 year old with history of CVA with right-sided hemiparesis, chronic constipation, PAD, HTN, COPD, CKD 3B, GERD, AAA, anxiety, depression admitted for abdominal pain with concerns of constipation which improved after bowel regimen.  CT also revealed internal hernia versus sigmoid colon.  Eventually flex sig revealed ischemic colitis and advised to continue bowel regimen.  General surgery and GI signed off.  Currently working on safe disposition with TOC for SNF placement.  Assessment & Plan:  Abdominal pain secondary to severe constipation and ischemic colitis Colonic dysmotility/chronic constipation Constipation improved with bowel regimen along with abdominal pain.  CT also revealed internal hernia versus sigmoid colon.  Eventually flex sig on 11/6 revealed ischemic colitis and advised to continue bowel regimen.  General surgery and GI signed off   History of PAD with femoral/tibial bypass AAA -Continue aspirin , Plavix , statin and outpatient follow-up with vascular   CKD stage IIIb -Creatinine around baseline 1.2   Essential hypertension, stable -Norvasc , losartan    Depression -Cymbalta    Prior history of CVA with residual right-sided weakness -On aspirin  Plavix  and statin.  Follows with neurology at [facility]   COPD -As needed bronchodilators   Debility - PT/OT-SNF     DVT prophylaxis: Lovenox     Code Status: Full Code Family Communication:  Called [name] No answer.  SNF placement PT Follow up Recs: Skilled Nursing-Short Term Rehab (<3 Hours/Day)07/20/2024 1251  Subjective: Seen at bedside no complaints   Examination:  General exam: Appears calm and comfortable  Respiratory system: Clear to auscultation. Respiratory effort normal. Cardiovascular system: S1 & S2 heard, RRR. No JVD, murmurs, rubs, gallops or clicks. No pedal  edema. Gastrointestinal system: Abdomen is nondistended, soft and nontender. No organomegaly or masses felt. Normal bowel sounds heard. Central nervous system: Alert and oriented. No focal neurological deficits. Extremities: Symmetric 5 x 5 power. Skin: No rashes, lesions or ulcers Psychiatry: Judgement and insight appear normal. Mood & affect appropriate.                Diet Orders (From admission, onward)     Start     Ordered   07/20/24 0000  Diet - low sodium heart healthy        07/20/24 0909   07/17/24 1321  DIET SOFT Fluid consistency: Thin  Diet effective now       Question:  Fluid consistency:  Answer:  Thin   07/17/24 1320            Objective: Vitals:   07/20/24 1611 07/20/24 2109 07/21/24 0538 07/21/24 0806  BP: (!) 153/76 (!) 124/98 (!) 156/84 (!) 170/93  Pulse: 70 76 70 73  Resp: 20   16  Temp: 98.1 F (36.7 C) 98.4 F (36.9 C) (!) 97.5 F (36.4 C) 98.7 F (37.1 C)  TempSrc:      SpO2: 97% 98% 96% 95%  Weight:      Height:        Intake/Output Summary (Last 24 hours) at 07/21/2024 1037 Last data filed at 07/21/2024 0835 Gross per 24 hour  Intake 593 ml  Output --  Net 593 ml   Filed Weights   07/17/24 1159  Weight: 78.5 kg    Scheduled Meds:  amLODipine   5 mg Oral Daily   aspirin  EC  81 mg Oral Daily   clobetasol  cream   Topical BID   clonazePAM   0.5 mg Oral QHS   clopidogrel   75 mg Oral Daily   DULoxetine   30 mg Oral QHS   DULoxetine   60 mg Oral Daily   enoxaparin  (LOVENOX ) injection  40 mg Subcutaneous Q24H   linaclotide   290 mcg Oral QAC breakfast   losartan   50 mg Oral Daily   ondansetron  (ZOFRAN ) IV  4 mg Intravenous Once   polyethylene glycol  17 g Oral Daily   rOPINIRole   2 mg Oral QHS   Continuous Infusions:  sodium chloride       Nutritional status     Body mass index is 27.1 kg/m.  Data Reviewed:   CBC: Recent Labs  Lab 07/16/24 2205 07/18/24 0515 07/19/24 1141  WBC 11.9* 7.3 6.3  HGB 14.1 12.0  12.7  HCT 44.9 37.5 39.9  MCV 88.9 87.6 88.3  PLT 296 244 244   Basic Metabolic Panel: Recent Labs  Lab 07/16/24 2205 07/18/24 0515 07/19/24 1141  NA 137 137 136  K 3.8 3.7 3.7  CL 100 103 101  CO2 26 24 26   GLUCOSE 135* 107* 90  BUN 15 14 15   CREATININE 1.33* 0.99 0.81  CALCIUM  9.4 8.9 9.1   GFR: Estimated Creatinine Clearance: 59.8 mL/min (by C-G formula based on SCr of 0.81 mg/dL). Liver Function Tests: Recent Labs  Lab 07/16/24 2205 07/18/24 0515  AST 17 13*  ALT 14 12  ALKPHOS 76 68  BILITOT 0.6 0.3  PROT 7.4 6.2*  ALBUMIN  3.7 3.0*   Recent Labs  Lab 07/16/24 2205  LIPASE 31   No results for input(s): AMMONIA in the last 168 hours. Coagulation Profile: No results for input(s): INR, PROTIME in the last 168 hours. Cardiac Enzymes: No results for input(s): CKTOTAL, CKMB, CKMBINDEX, TROPONINI in the last 168 hours. BNP (last 3 results) No results for input(s): PROBNP in the last 8760 hours. HbA1C: No results for input(s): HGBA1C in the last 72 hours. CBG: No results for input(s): GLUCAP in the last 168 hours. Lipid Profile: No results for input(s): CHOL, HDL, LDLCALC, TRIG, CHOLHDL, LDLDIRECT in the last 72 hours. Thyroid  Function Tests: No results for input(s): TSH, T4TOTAL, FREET4, T3FREE, THYROIDAB in the last 72 hours. Anemia Panel: No results for input(s): VITAMINB12, FOLATE, FERRITIN, TIBC, IRON, RETICCTPCT in the last 72 hours. Sepsis Labs: No results for input(s): PROCALCITON, LATICACIDVEN in the last 168 hours.  No results found for this or any previous visit (from the past 240 hours).       Radiology Studies: No results found.         LOS: 0 days   Time spent= 35 mins    Burgess JAYSON Dare, MD [facility]  If 7PM-7AM, please contact night-coverage  07/21/2024, 10:37 AM

## 2024-07-21 NOTE — Progress Notes (Signed)
 Occupational Therapy Treatment Patient Details Name: [name] MRN: [id number] DOB: 1946-05-03 Today's Date: 07/21/2024   History of present illness Pt is a 78 y.o. female presents to ED 11/06 via EMS  sudden onset of generalized lower abdominal pain overnight with nausea vomiting as well as increased bowel movements. 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 Pt able to sit EOB for 15 more minutes, did not wish to sit in recliner due to fatigue. Pt able to brush teeth/partials with min A and increased time, R hemi limits BL tasks but able to complete with increased time. Pt min A for UB dressing, increased time/effort. Pt min A to assist back to bed for BLEs. Pt limited with OOB activities due to fatigue but participates well in EOB sitting. Pt would benefit from continued acute OT to maximize activity tolerance, OOB activities, and ADL participation, HHOT follow up still recommended.       If plan is discharge home, recommend the following:  Two people to help with walking and/or transfers;Two people to help with bathing/dressing/bathroom;Assistance with cooking/housework;Direct supervision/assist for financial management;Direct supervision/assist for medications management;Assist for transportation;Help with stairs or ramp for entrance   Equipment Recommendations  BSC/3in1    Recommendations for Other Services      Precautions / Restrictions Precautions Precautions: Fall Recall of Precautions/Restrictions: Intact Restrictions Weight Bearing Restrictions Per Provider Order: No       Mobility Bed Mobility Overal bed mobility: Needs  Assistance Bed Mobility: Sit to Supine       Sit to supine: Min assist   General bed mobility comments: min A for assist back to bed for BLEs    Transfers Overall transfer level: Needs assistance                 General transfer comment: EOB activities, did not stand/transfer     Balance Overall balance assessment: Needs assistance Sitting-balance support: No upper extremity supported, Feet supported Sitting balance-Leahy Scale: Fair                                     ADL either performed or assessed with clinical judgement   ADL Overall ADL's : Needs assistance/impaired Eating/Feeding: Set up;Sitting   Grooming: Minimal assistance;Sitting           Upper Body Dressing : Minimal assistance;Sitting                     General ADL Comments: Pt sitting EOB, brushed teeth/partials with min A due to R hemi. min A for UB dressing increased time and cueing/motivation    Extremity/Trunk Assessment Upper Extremity Assessment Upper Extremity Assessment: RUE deficits/detail RUE Deficits / Details: R hemipelgia secondary to prior CVA; increased tone; stiffness in all joint; ver limited muscle activation in shoulder; Pt with self-ROM shoulder flexion to approx. 45 degrees and PROM to approx. 85 degrees; self-ROM and PROM elbow flexion to 135 degrees; weak but function with pt demonstrating AROM of all digits WFL; decreased coordination; decreased sensation RUE Sensation: decreased light touch;decreased proprioception RUE Coordination: decreased fine motor;decreased gross motor  Vision       Perception     Praxis     Communication Communication Communication: No apparent difficulties   Cognition Arousal: Alert Behavior During Therapy: WFL for tasks assessed/performed Cognition: Cognition impaired             OT - Cognition Comments: grossly WFLs                 Following commands: Intact        Cueing    Cueing Techniques: Verbal cues, Gestural cues, Tactile cues, Visual cues  Exercises      Shoulder Instructions       General Comments      Pertinent Vitals/ Pain       Pain Assessment Pain Assessment: Faces Faces Pain Scale: Hurts a little bit Pain Location: generalized with activity Pain Descriptors / Indicators: Discomfort, Grimacing, Guarding Pain Intervention(s): Monitored during session  Home Living                                          Prior Functioning/Environment              Frequency  Min 2X/week        Progress Toward Goals  OT Goals(current goals can now be found in the care plan section)  Progress towards OT goals: Progressing toward goals  Acute Rehab OT Goals Patient Stated Goal: to improve activity tolerance OT Goal Formulation: With patient Time For Goal Achievement: 08/01/24 Potential to Achieve Goals: Fair ADL Goals Pt Will Perform Eating: with set-up;with adaptive utensils;sitting Pt Will Perform Grooming: with contact guard assist;sitting Pt Will Perform Upper Body Dressing: with min assist;sitting Pt Will Transfer to Toilet: with max assist;stand pivot transfer;bedside commode Pt/caregiver will Perform Home Exercise Program: Increased ROM;Right Upper extremity;With minimal assist;With written HEP provided Additional ADL Goal #1: Patient will demonstrate ability to complete bed mobility with Min-Mod assist during/in preparation for functional tasks.  Plan      Co-evaluation                 AM-PAC OT 6 Clicks Daily Activity     Outcome Measure   Help from another person eating meals?: A Little Help from another person taking care of personal grooming?: A Little Help from another person toileting, which includes using toliet, bedpan, or urinal?: Total   Help from another person to put on and taking off regular upper body clothing?: A Little Help from another person to put on and taking off regular lower  body clothing?: A Lot 6 Click Score: 12    End of Session    OT Visit Diagnosis: Other abnormalities of gait and mobility (R26.89);Ataxia, unspecified (R27.0);Muscle weakness (generalized) (M62.81);Hemiplegia and hemiparesis;Pain Hemiplegia - Right/Left: Right Hemiplegia - dominant/non-dominant: Dominant Hemiplegia - caused by: Cerebral infarction   Activity Tolerance Patient limited by fatigue   Patient Left in bed;with call bell/phone within reach;with family/visitor present   Nurse Communication Mobility status        Time: 1444-1500 OT Time Calculation (min): 16 min  Charges: OT General Charges $OT Visit: 1 Visit OT Treatments $Self Care/Home Management : 8-22 mins  [place], OTR/L   Elouise JONELLE Bott 07/21/2024, 3:08 PM

## 2024-07-21 NOTE — TOC Progression Note (Signed)
 Transition of Care [facility]) - Progression Note    Patient Details  Name: [name] MRN: [id number] Date of Birth: Jan 23, 1946  [facility]) CM/SW Contact  Sherline Clack, [place] Phone Number: 07/21/2024, 2:57 PM  Clinical Narrative:     CSW presented facility list with Medicare ratings to patient at bedside. Patient requested CSW come back at a later time to get patient's choice. CSW also reached out to son/[name] regarding placement options. [name] was unable to answer and CSW left a VM with a callback number. [name] called CSW back and said the family had chosen [facility] for SNF. [facility] has a bed open for tomorrow. Provider and family made aware. CSW will send discharge date and facility name to [facility] waiver email. CSW will continue to follow.   Expected Discharge Plan: Skilled Nursing Facility Barriers to Discharge: No Barriers Identified               Expected Discharge Plan and Services       Living arrangements for the past 2 months: Single Family Home Expected Discharge Date: 07/18/24                                     Social Drivers of Health (SDOH) Interventions SDOH Screenings   Food Insecurity: No Food Insecurity (07/17/2024)  Housing: Low Risk  (07/17/2024)  Transportation Needs: No Transportation Needs (07/17/2024)  Utilities: Not At Risk (07/17/2024)  Alcohol Screen: Low Risk  (06/13/2023)  Depression (PHQ2-9): High Risk (06/13/2023)  Financial Resource Strain: Low Risk  (03/23/2021)  Physical Activity: Inactive (06/13/2023)  Social Connections: Unknown (07/17/2024)  Stress: Stress Concern Present (06/13/2023)  Tobacco Use: High Risk (07/17/2024)  Health Literacy: Adequate Health Literacy (06/13/2023)    Readmission Risk Interventions     No data to display

## 2024-07-21 NOTE — Plan of Care (Signed)

## 2024-07-21 NOTE — Progress Notes (Signed)
 Physical Therapy Treatment Patient Details Name: [name] MRN: [id number] DOB: 1945-11-23 Today's Date: 07/21/2024   History of Present Illness Pt is a 78 y.o. female presents to ED 11/06 via EMS  sudden onset of generalized lower abdominal pain overnight with nausea vomiting as well as increased bowel movements. 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 Pt needing modA for bed mobility, and mod-maxA to stand from EOB depending on height of surface. Pt with chronic deficits in RLE, but also poor strength in L hip to generate hip clearance and initiate stand. Pt with limited anterior wt shift and therefore dependent on heavy assist to rise and correct balance. Pt with great improvement in activity tolerance and completed increased reps and standing exercises this session. Will continue efforts acutely, recommendations for post-acute rehab <3hours/day remain appropriate.    If plan is discharge home, recommend the following: Assistance with cooking/housework;Direct supervision/assist for medications management;Assistance with feeding;Direct supervision/assist for financial management;Assist for transportation;Help with stairs or ramp for entrance;Supervision due to cognitive status;Two people to help with walking and/or transfers;Two people to help with bathing/dressing/bathroom   Can travel by private vehicle     No  Equipment Recommendations  None recommended by PT    Recommendations for Other Services       Precautions / Restrictions Precautions Precautions: Fall Recall of Precautions/Restrictions: Intact Restrictions Weight Bearing  Restrictions Per Provider Order: No     Mobility  Bed Mobility Overal bed mobility: Needs Assistance Bed Mobility: Supine to Sit     Supine to sit: Mod assist, HOB elevated, Used rails     General bed mobility comments: modA to assist RLE to EOB and trunk elevation. pt able to hold balance sitting EOB but not scoot to EOB    Transfers Overall transfer level: Needs assistance Equipment used: Ambulation equipment used Transfers: Sit to/from Stand Sit to Stand: Mod assist, Max assist, From elevated surface           General transfer comment: completed x5 from EOB needing maxA and x5 from stedy flaps needing min-modA with max cues for hip and knee extension and upright posture. poor hip extension to generate initial hip lift Transfer via Lift Equipment: Stedy  Ambulation/Gait               General Gait Details: pt unable, maxA while working on standing marches in [employer] Overall balance assessment: Needs assistance Sitting-balance support: No upper extremity supported, Feet supported Sitting balance-Leahy Scale: Fair Sitting balance - Comments: min-modA to maintain initially, progressed to CGA with single UE support. Postural control: Posterior lean Standing balance support: Bilateral upper extremity supported, During functional activity Standing balance-Leahy Scale: Poor Standing balance comment: dependent on BUE support and minA in stedy, blocking R knee                            Communication Communication Communication: No apparent difficulties  Cognition Arousal: Alert Behavior During Therapy: WFL for tasks assessed/performed   PT - Cognitive impairments: Initiation, Sequencing  PT - Cognition Comments: able to follow commands with increased time, aware of deficits and giving accurate information about home. Following commands: Intact      Cueing Cueing Techniques: Verbal cues, Gestural cues,  Tactile cues, Visual cues  Exercises Other Exercises Other Exercises: sit-stand in stedy x5, from EOB x5 Other Exercises: standing marches in stedy x5    General Comments General comments (skin integrity, edema, etc.): VSS on RA      Pertinent Vitals/Pain Pain Assessment Pain Assessment: Faces Faces Pain Scale: Hurts a little bit Pain Location: generalized with activity Pain Descriptors / Indicators: Discomfort, Grimacing, Guarding Pain Intervention(s): Limited activity within patient's tolerance, Monitored during session     PT Goals (current goals can now be found in the care plan section) Acute Rehab PT Goals Patient Stated Goal: to walk again PT Goal Formulation: With patient Time For Goal Achievement: 08/01/24 Potential to Achieve Goals: Fair Progress towards PT goals: Progressing toward goals    Frequency    Min 3X/week       AM-PAC PT 6 Clicks Mobility   Outcome Measure  Help needed turning from your back to your side while in a flat bed without using bedrails?: A Little Help needed moving from lying on your back to sitting on the side of a flat bed without using bedrails?: A Lot Help needed moving to and from a bed to a chair (including a wheelchair)?: A Lot Help needed standing up from a chair using your arms (e.g., wheelchair or bedside chair)?: Total Help needed to walk in hospital room?: Total Help needed climbing 3-5 steps with a railing? : Total 6 Click Score: 10    End of Session Equipment Utilized During Treatment: Gait belt;Other (comment) (stedy) Activity Tolerance: Patient tolerated treatment well Patient left: in bed;with call bell/phone within reach;with bed alarm set (with OT) Nurse Communication: Mobility status;Need for lift equipment (stedy) PT Visit Diagnosis: Unsteadiness on feet (R26.81);Other abnormalities of gait and mobility (R26.89);Muscle weakness (generalized) (M62.81);Difficulty in walking, not elsewhere classified (R26.2);Other  symptoms and signs involving the nervous system (R29.898);Hemiplegia and hemiparesis Hemiplegia - Right/Left: Right Hemiplegia - dominant/non-dominant: Dominant Hemiplegia - caused by: Cerebral infarction     Time: 1415-1446 PT Time Calculation (min) (ACUTE ONLY): 31 min  Charges:    $Therapeutic Exercise: 8-22 mins $Therapeutic Activity: 8-22 mins PT General Charges $$ ACUTE PT VISIT: 1 Visit                     Izetta Call, PT, DPT   Acute Rehabilitation Department Office [phone] Secure Chat Communication Preferred   Izetta JULIANNA Call 07/21/2024, 3:28 PM

## 2024-07-22 DIAGNOSIS — E785 Hyperlipidemia, unspecified: Secondary | ICD-10-CM | POA: Diagnosis not present

## 2024-07-22 DIAGNOSIS — I499 Cardiac arrhythmia, unspecified: Secondary | ICD-10-CM | POA: Diagnosis not present

## 2024-07-22 DIAGNOSIS — E039 Hypothyroidism, unspecified: Secondary | ICD-10-CM | POA: Diagnosis not present

## 2024-07-22 DIAGNOSIS — N183 Chronic kidney disease, stage 3 unspecified: Secondary | ICD-10-CM | POA: Diagnosis not present

## 2024-07-22 DIAGNOSIS — K55039 Acute (reversible) ischemia of large intestine, extent unspecified: Secondary | ICD-10-CM | POA: Diagnosis not present

## 2024-07-22 DIAGNOSIS — R278 Other lack of coordination: Secondary | ICD-10-CM | POA: Diagnosis not present

## 2024-07-22 DIAGNOSIS — E46 Unspecified protein-calorie malnutrition: Secondary | ICD-10-CM | POA: Diagnosis not present

## 2024-07-22 DIAGNOSIS — R2681 Unsteadiness on feet: Secondary | ICD-10-CM | POA: Diagnosis not present

## 2024-07-22 DIAGNOSIS — R0602 Shortness of breath: Secondary | ICD-10-CM | POA: Diagnosis not present

## 2024-07-22 DIAGNOSIS — N179 Acute kidney failure, unspecified: Secondary | ICD-10-CM | POA: Diagnosis not present

## 2024-07-22 DIAGNOSIS — K562 Volvulus: Secondary | ICD-10-CM | POA: Diagnosis not present

## 2024-07-22 DIAGNOSIS — L918 Other hypertrophic disorders of the skin: Secondary | ICD-10-CM | POA: Diagnosis not present

## 2024-07-22 DIAGNOSIS — F411 Generalized anxiety disorder: Secondary | ICD-10-CM | POA: Diagnosis not present

## 2024-07-22 DIAGNOSIS — G2581 Restless legs syndrome: Secondary | ICD-10-CM | POA: Diagnosis not present

## 2024-07-22 DIAGNOSIS — M6281 Muscle weakness (generalized): Secondary | ICD-10-CM | POA: Diagnosis not present

## 2024-07-22 DIAGNOSIS — F329 Major depressive disorder, single episode, unspecified: Secondary | ICD-10-CM | POA: Diagnosis not present

## 2024-07-22 DIAGNOSIS — D125 Benign neoplasm of sigmoid colon: Secondary | ICD-10-CM | POA: Diagnosis not present

## 2024-07-22 DIAGNOSIS — E871 Hypo-osmolality and hyponatremia: Secondary | ICD-10-CM | POA: Diagnosis not present

## 2024-07-22 DIAGNOSIS — F17210 Nicotine dependence, cigarettes, uncomplicated: Secondary | ICD-10-CM | POA: Diagnosis not present

## 2024-07-22 DIAGNOSIS — I160 Hypertensive urgency: Secondary | ICD-10-CM | POA: Diagnosis not present

## 2024-07-22 DIAGNOSIS — I639 Cerebral infarction, unspecified: Secondary | ICD-10-CM | POA: Diagnosis not present

## 2024-07-22 DIAGNOSIS — I10 Essential (primary) hypertension: Secondary | ICD-10-CM | POA: Diagnosis not present

## 2024-07-22 DIAGNOSIS — R29818 Other symptoms and signs involving the nervous system: Secondary | ICD-10-CM | POA: Diagnosis not present

## 2024-07-22 DIAGNOSIS — R42 Dizziness and giddiness: Secondary | ICD-10-CM | POA: Diagnosis not present

## 2024-07-22 DIAGNOSIS — R933 Abnormal findings on diagnostic imaging of other parts of digestive tract: Secondary | ICD-10-CM | POA: Diagnosis not present

## 2024-07-22 DIAGNOSIS — I129 Hypertensive chronic kidney disease with stage 1 through stage 4 chronic kidney disease, or unspecified chronic kidney disease: Secondary | ICD-10-CM | POA: Diagnosis not present

## 2024-07-22 DIAGNOSIS — J9611 Chronic respiratory failure with hypoxia: Secondary | ICD-10-CM | POA: Diagnosis not present

## 2024-07-22 DIAGNOSIS — R41841 Cognitive communication deficit: Secondary | ICD-10-CM | POA: Diagnosis not present

## 2024-07-22 DIAGNOSIS — K559 Vascular disorder of intestine, unspecified: Secondary | ICD-10-CM | POA: Diagnosis not present

## 2024-07-22 DIAGNOSIS — I70229 Atherosclerosis of native arteries of extremities with rest pain, unspecified extremity: Secondary | ICD-10-CM | POA: Diagnosis not present

## 2024-07-22 DIAGNOSIS — Z7401 Bed confinement status: Secondary | ICD-10-CM | POA: Diagnosis not present

## 2024-07-22 NOTE — Plan of Care (Signed)

## 2024-07-22 NOTE — Discharge Summary (Signed)
 Physician Discharge Summary  [name] FMW:[id number] DOB: February 27, 1946 DOA: 07/16/2024  PCP: Jenny Reyes SAUNDERS, MD  Admit date: 07/16/2024 Discharge date: 07/22/2024  Admitted From: [facility] Disposition:  SNF  Recommendations for Outpatient Follow-up:  Follow up with PCP in 1-2 weeks Please obtain BMP/CBC in one week your next doctors visit.  Bowel regimen as needed.    Discharge Condition: Stable CODE STATUS: Full Diet recommendation: Heart Healthy.   Brief/Interim Summary: Brief Narrative:   78 year old with history of CVA with right-sided hemiparesis, chronic constipation, PAD, HTN, COPD, CKD 3B, GERD, AAA, anxiety, depression admitted for abdominal pain with concerns of constipation which improved after bowel regimen.  CT also revealed internal hernia versus sigmoid colon.  Eventually flex sig revealed ischemic colitis and advised to continue bowel regimen.  General surgery and GI signed off.  Currently working on safe disposition with TOC for SNF placement.  Assessment & Plan:  Abdominal pain secondary to severe constipation and ischemic colitis Colonic dysmotility/chronic constipation Constipation improved with bowel regimen along with abdominal pain.  CT also revealed internal hernia versus sigmoid colon.  Eventually flex sig on 11/6 revealed ischemic colitis and advised to continue bowel regimen.  General surgery and GI signed off   History of PAD with femoral/tibial bypass AAA -Continue aspirin , Plavix , statin and outpatient follow-up with vascular   CKD stage IIIb -Creatinine around baseline 1.2   Essential hypertension, stable -Norvasc , losartan    Depression -Cymbalta    Prior history of CVA with residual right-sided weakness -On aspirin  Plavix  and statin.  Follows with neurology at [facility]   COPD -As needed bronchodilators   Debility - PT/OT-SNF     DVT prophylaxis: Lovenox     Code Status: Full Code Family Communication:   SNF placement PT Follow up  Recs: SNF placement.   Subjective: Seen at bedside no complaints   Examination:  General exam: Appears calm and comfortable  Respiratory system: Clear to auscultation. Respiratory effort normal. Cardiovascular system: S1 & S2 heard, RRR. No JVD, murmurs, rubs, gallops or clicks. No pedal edema. Gastrointestinal system: Abdomen is nondistended, soft and nontender. No organomegaly or masses felt. Normal bowel sounds heard. Central nervous system: Alert and oriented. No focal neurological deficits. Extremities: Symmetric 5 x 5 power. Skin: No rashes, lesions or ulcers Psychiatry: Judgement and insight appear normal. Mood & affect appropriate.    Discharge Diagnoses:  Principal Problem:   Ischemic colitis Active Problems:   Abdominal pain   CKD (chronic kidney disease), stage III (HCC)   PVD (peripheral vascular disease)   Depression   HTN (hypertension)   History of CVA (cerebrovascular accident)   Debility   Abnormal CT scan, colon   Sigmoid volvulus (HCC)      Discharge Exam: Vitals:   07/22/24 0447 07/22/24 0804  BP: (!) (P) 142/65 139/70  Pulse: (P) 65 68  Resp:  18  Temp: (P) 97.9 F (36.6 C) 97.9 F (36.6 C)  SpO2: (P) 97% 95%   Vitals:   07/21/24 2020 07/22/24 0042 07/22/24 0447 07/22/24 0804  BP: 129/70 (!) 155/69 (!) (P) 142/65 139/70  Pulse: 72 72 (P) 65 68  Resp: 17   18  Temp: 97.9 F (36.6 C) 97.7 F (36.5 C) (P) 97.9 F (36.6 C) 97.9 F (36.6 C)  TempSrc:   (P) Oral   SpO2: 96% 97% (P) 97% 95%  Weight:      Height:          Discharge Instructions  Discharge Instructions  Call MD for:  difficulty breathing, headache or visual disturbances   Complete by: As directed    Call MD for:  extreme fatigue   Complete by: As directed    Call MD for:  hives   Complete by: As directed    Call MD for:  persistant dizziness or light-headedness   Complete by: As directed    Call MD for:  persistant nausea and vomiting   Complete by: As  directed    Call MD for:  redness, tenderness, or signs of infection (pain, swelling, redness, odor or green/yellow discharge around incision site)   Complete by: As directed    Call MD for:  severe uncontrolled pain   Complete by: As directed    Call MD for:  temperature >100.4   Complete by: As directed    Diet - low sodium heart healthy   Complete by: As directed    Increase activity slowly   Complete by: As directed       Allergies as of 07/22/2024       Reactions   Crestor  [rosuvastatin ] Other (See Comments)   Myalgias   Lipitor [atorvastatin ] Other (See Comments)   Myalgias   Pravastatin Other (See Comments)   Myalgias        Medication List     STOP taking these medications    bisacodyl  5 MG EC tablet Commonly known as: DULCOLAX   Sennosides 25 MG Tabs       TAKE these medications    acetaminophen  500 MG tablet Commonly known as: TYLENOL  Take 500 mg by mouth See admin instructions. Give 1 tablet (500mg ) by mouth at bedtime AND give 1 tablet every 4 hours as needed for pain. Not to exceed 3G daily from all sources.   albuterol  108 (90 Base) MCG/ACT inhaler Commonly known as: Ventolin  HFA Inhale 1-2 puffs into the lungs every 6 (six) hours as needed for wheezing or shortness of breath.   amLODipine  5 MG tablet Commonly known as: NORVASC  Take 1 tablet (5 mg total) by mouth daily.   aspirin  EC 81 MG tablet Take 81 mg by mouth daily.   Biofreeze Cool The Pain 10 % Crea Generic drug: Menthol (Topical Analgesic) Apply 1 application  topically daily as needed (spasms). apply to left side of neck   Calmoseptine 0.44-20.6 % Oint Generic drug: Menthol-Zinc Oxide Apply 1 application  topically See admin instructions. Apply topically to buttocks/sacrum three times daily with every shift AND apply with each toileting/incontinent change.   clonazePAM  0.5 MG tablet Commonly known as: KLONOPIN  Take 1 tablet (0.5 mg total) by mouth at bedtime.   clopidogrel   75 MG tablet Commonly known as: PLAVIX  Take 1 tablet (75 mg total) by mouth daily with breakfast. What changed: when to take this   cyanocobalamin 500 MCG tablet Commonly known as: VITAMIN B12 Take 500 mcg by mouth daily.   DULoxetine  60 MG capsule Commonly known as: CYMBALTA  Take 60 mg by mouth daily.   DULoxetine  30 MG capsule Commonly known as: CYMBALTA  Take 30 mg by mouth at bedtime.   ezetimibe  10 MG tablet Commonly known as: ZETIA  Take 1 tablet (10 mg total) by mouth daily.   linaclotide  290 MCG Caps capsule Commonly known as: LINZESS  Take 1 capsule (290 mcg total) by mouth daily before breakfast. What changed: when to take this   losartan  50 MG tablet Commonly known as: COZAAR  Take 50 mg by mouth daily.   meclizine  25 MG tablet Commonly known as: ANTIVERT   Take 25 mg by mouth every 8 (eight) hours as needed for dizziness.   Muscle Rub Ultra Strength 12-19-28 % Crea Generic drug: Camphor-Menthol-Methyl Sal Apply 1 application  topically every 4 (four) hours as needed (pain). apply to shoulder and neck   polyethylene glycol 17 g packet Commonly known as: MIRALAX  / GLYCOLAX  Take 17 g by mouth daily.   rOPINIRole  2 MG tablet Commonly known as: REQUIP  TAKE 1 TABLET(2 MG) BY MOUTH AT BEDTIME   senna-docusate 8.6-50 MG tablet Commonly known as: Senokot-S Take 2 tablets by mouth at bedtime as needed for mild constipation.   simethicone  80 MG chewable tablet Commonly known as: MYLICON Chew 1 tablet (80 mg total) by mouth 4 (four) times daily. What changed:  when to take this reasons to take this        Follow-up Information     [facility] Follow up.   Specialty: Home Health Services Why: [facility] will provide home health services.  They will call you in the next 24-48 hours to set up services. Contact information: [address] [place] [address] [phone]         Jenny Reyes SAUNDERS, MD. Schedule an  appointment as soon as possible for a visit in 1 week(s).   Specialties: Family Medicine, Sports Medicine Contact information: [address] [place] [address] [phone]         Jenny Reyes SAUNDERS, MD Follow up in 1 week(s).   Specialties: Family Medicine, Sports Medicine Contact information: [address] [place] [address] [phone]                Allergies  Allergen Reactions   Crestor  [Rosuvastatin ] Other (See Comments)    Myalgias   Lipitor [Atorvastatin ] Other (See Comments)    Myalgias   Pravastatin Other (See Comments)    Myalgias    You were cared for by a hospitalist during your hospital stay. If you have any questions about your discharge medications or the care you received while you were in the hospital after you are discharged, you can call the unit and asked to speak with the hospitalist on call if the hospitalist that took care of you is not available. Once you are discharged, your primary care physician will handle any further medical issues. Please note that no refills for any discharge medications will be authorized once you are discharged, as it is imperative that you return to your primary care physician (or establish a relationship with a primary care physician if you do not have one) for your aftercare needs so that they can reassess your need for medications and monitor your lab values.  You were cared for by a hospitalist during your hospital stay. If you have any questions about your discharge medications or the care you received while you were in the hospital after you are discharged, you can call the unit and asked to speak with the hospitalist on call if the hospitalist that took care of you is not available. Once you are discharged, your primary care physician will handle any further medical issues. Please note that NO REFILLS for any discharge medications will be authorized once you are discharged, as it is imperative that you return  to your primary care physician (or establish a relationship with a primary care physician if you do not have one) for your aftercare needs so that they can reassess your need for medications and monitor your lab values.  Please request your  Prim.MD to go over all Hospital Tests and Procedure/Radiological results at the follow up, please get all Hospital records sent to your Prim MD by signing hospital release before you go home.  Get CBC, CMP, 2 view Chest X ray checked  by Primary MD during your next visit or SNF MD in 5-7 days ( we routinely change or add medications that can affect your baseline labs and fluid status, therefore we recommend that you get the mentioned basic workup next visit with your PCP, your PCP may decide not to get them or add new tests based on their clinical decision)  On your next visit with your primary care physician please Get Medicines reviewed and adjusted.  If you experience worsening of your admission symptoms, develop shortness of breath, life threatening emergency, suicidal or homicidal thoughts you must seek medical attention immediately by calling 911 or calling your MD immediately  if symptoms less severe.  You Must read complete instructions/literature along with all the possible adverse reactions/side effects for all the Medicines you take and that have been prescribed to you. Take any new Medicines after you have completely understood and accpet all the possible adverse reactions/side effects.   Do not drive, operate heavy machinery, perform activities at heights, swimming or participation in water activities or provide baby sitting services if your were admitted for syncope or siezures until you have seen by Primary MD or a Neurologist and advised to do so again.  Do not drive when taking Pain medications.   Procedures/Studies: CT Angio Chest/Abd/Pel for Dissection W and/or W/WO Result Date: 07/17/2024 CLINICAL DATA:  Abdominal pain with nausea and  vomiting. EXAM: CT ANGIOGRAPHY CHEST, ABDOMEN AND PELVIS TECHNIQUE: Non-contrast CT of the chest was initially obtained. Multidetector CT imaging through the chest, abdomen and pelvis was performed using the standard protocol during bolus administration of intravenous contrast. Multiplanar reconstructed images and MIPs were obtained and reviewed to evaluate the vascular anatomy. RADIATION DOSE REDUCTION: This exam was performed according to the departmental dose-optimization program which includes automated exposure control, adjustment of the mA and/or kV according to patient size and/or use of iterative reconstruction technique. CONTRAST:  OMNIPAQUE  IOHEXOL  350 MG/ML SOLN COMPARISON:  October 02, 2023 FINDINGS: CTA CHEST FINDINGS Cardiovascular: There is marked severity calcification of the thoracic aorta. The ascending thoracic aorta measures 3.2 cm in diameter, while the descending thoracic aorta measures approximately 3.7 cm. Satisfactory opacification of the pulmonary arteries to the segmental level. No evidence of pulmonary embolism. There is mild cardiomegaly. No pericardial effusion. Mediastinum/Nodes: No enlarged mediastinal, hilar, or axillary lymph nodes. Thyroid  gland, trachea, and esophagus demonstrate no significant findings. Lungs/Pleura: There is evidence of extensive emphysematous lung disease. Moderate severity areas of scarring, fibrosis and atelectasis are seen within the bilateral lung bases and posterior aspect of the right upper lobe. No pleural effusion or pneumothorax is identified. Musculoskeletal: Multilevel degenerative changes are seen throughout the thoracic spine. Review of the MIP images confirms the above findings. CTA ABDOMEN AND PELVIS FINDINGS VASCULAR Aorta: There is extensive aortic atherosclerosis with tortuosity and 3.5 cm x 3.2 cm aneurysmal dilatation of the infrarenal abdominal aorta. There is no evidence of dissection, vasculitis or significant stenosis. Celiac and  SMA: A shared origin of the celiac and superior mesenteric arteries is suspected, as separate origins for each cannot be identified. There is no evidence of aneurysmal dilatation, dissection, vasculitis or significant stenosis. Renals: Both renal arteries are patent, with marked severity narrowing and suspected hemodynamically significant stenosis of the  origin of the right renal artery. There is no evidence of renal artery aneurysm, dissection, vasculitis, fibromuscular dysplasia. IMA: Patent without evidence of aneurysm, dissection, vasculitis or significant stenosis. Inflow: Patent with extensive atherosclerotic disease. No evidence of aneurysm, dissection, vasculitis or significant stenosis. A left common femoral artery stent is noted. Veins: No obvious venous abnormality within the limitations of this arterial phase study. Review of the MIP images confirms the above findings. NON-VASCULAR Hepatobiliary: No focal liver abnormality is seen. The gallbladder is contracted without evidence of gallstones, gallbladder wall thickening, or biliary dilatation. Pancreas: Unremarkable. No pancreatic ductal dilatation or surrounding inflammatory changes. Spleen: Normal in size without focal abnormality. Adrenals/Urinary Tract: There is mild diffuse enlargement of the left adrenal gland. The right adrenal gland is unremarkable. Kidneys are normal in size, without renal calculi or hydronephrosis. A 2.7 cm simple cyst is seen within the lower pole of the left kidney. A 1.2 cm simple cyst is noted within the medial aspect of the mid right kidney. Bladder is unremarkable. Stomach/Bowel: Stomach is within normal limits. Appendix appears normal. No evidence of bowel dilatation. An area of mesenteric twisting is seen within the medial aspect of the mid to lower left abdomen (axial CT images 210 through 249, CT series 7). As a result, moderately thickened and poorly distended loops of sigmoid colon are seen within the abdomen on the  right. Lymphatic: No abnormal abdominal or pelvic lymph nodes are identified. Reproductive: A mild-to-moderate amount of air attenuation is seen within the endometrial canal (axial CT images 242 through 254, CT series 7. The bilateral adnexa are unremarkable. Other: No abdominal wall hernia or abnormality. No abdominopelvic ascites. Musculoskeletal: Multilevel degenerative changes seen throughout the lumbar spine. Review of the MIP images confirms the above findings. IMPRESSION: 1. No evidence of pulmonary embolism. 2. 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 3. Extensive aortic atherosclerosis with tortuosity and 3.5 cm x 3.2 cm aneurysmal dilatation of the infrarenal abdominal aorta. 4. Marked severity narrowing and suspected hemodynamically significant stenosis of the origin of the right renal artery. 5. Area of mesenteric twisting within the medial aspect of the mid to lower left abdomen, with resultant inflamed loops of sigmoid colon noted within the abdomen on the right. While this may represent an internal hernia, follow-up with abdomen and pelvis CT is recommended to exclude the presence of an early sigmoid volvulus. 6. Mild-to-moderate amount of air attenuation within the endometrial canal. Correlation with pelvic ultrasound is recommended. 7. Bilateral simple renal cysts. 8. Aortic atherosclerosis. Electronically Signed   By: Suzen Dials M.D.   On: 07/17/2024 03:40   CT Head Wo Contrast Result Date: 07/17/2024 EXAM: CT HEAD WITHOUT CONTRAST 07/17/2024 02:43:04 AM TECHNIQUE: CT of the head was performed without the administration of intravenous contrast. Automated exposure control, iterative reconstruction, and/or weight based adjustment of the mA/kV was utilized to reduce the radiation dose to as low as reasonably achievable. COMPARISON: None available. CLINICAL HISTORY: Neuro deficit, acute, stroke suspected FINDINGS:  BRAIN AND VENTRICLES: No acute hemorrhage. No evidence of acute large vascular territory infarct. Advanced patchy white matter hypodensities, compatible with chronic microvascular ischemic change. No hydrocephalus. No extra-axial collection. Stable 1 cm left posterior frontal lobe calcified extra-axial mass without mass effect, compatible with meningioma. ORBITS: No acute abnormality. SINUSES: No acute abnormality. SOFT TISSUES AND SKULL: No skull fracture. IMPRESSION: 1. No evidence of acute large vascular territory infarct. 2. Advanced chronic microvascular ischemic change. A white  matter infarct could easily be obscured. Recommend low threshold for MRI. 3. Stable 1 cm left posterior frontal lobe meningioma without mass effect. Electronically signed by: Gilmore Molt MD 07/17/2024 03:11 AM EST RP Workstation: [id number]     The results of significant diagnostics from this hospitalization (including imaging, microbiology, ancillary and laboratory) are listed below for reference.     Microbiology: No results found for this or any previous visit (from the past 240 hours).   Labs: BNP (last 3 results) No results for input(s): BNP in the last 8760 hours. Basic Metabolic Panel: Recent Labs  Lab 07/16/24 2205 07/18/24 0515 07/19/24 1141  NA 137 137 136  K 3.8 3.7 3.7  CL 100 103 101  CO2 26 24 26   GLUCOSE 135* 107* 90  BUN 15 14 15   CREATININE 1.33* 0.99 0.81  CALCIUM  9.4 8.9 9.1   Liver Function Tests: Recent Labs  Lab 07/16/24 2205 07/18/24 0515  AST 17 13*  ALT 14 12  ALKPHOS 76 68  BILITOT 0.6 0.3  PROT 7.4 6.2*  ALBUMIN  3.7 3.0*   Recent Labs  Lab 07/16/24 2205  LIPASE 31   No results for input(s): AMMONIA in the last 168 hours. CBC: Recent Labs  Lab 07/16/24 2205 07/18/24 0515 07/19/24 1141  WBC 11.9* 7.3 6.3  HGB 14.1 12.0 12.7  HCT 44.9 37.5 39.9  MCV 88.9 87.6 88.3  PLT 296 244 244   Cardiac Enzymes: No results for input(s): CKTOTAL, CKMB,  CKMBINDEX, TROPONINI in the last 168 hours. BNP: Invalid input(s): POCBNP CBG: No results for input(s): GLUCAP in the last 168 hours. D-Dimer No results for input(s): DDIMER in the last 72 hours. Hgb A1c No results for input(s): HGBA1C in the last 72 hours. Lipid Profile No results for input(s): CHOL, HDL, LDLCALC, TRIG, CHOLHDL, LDLDIRECT in the last 72 hours. Thyroid  function studies No results for input(s): TSH, T4TOTAL, T3FREE, THYROIDAB in the last 72 hours.  Invalid input(s): FREET3 Anemia work up No results for input(s): VITAMINB12, FOLATE, FERRITIN, TIBC, IRON, RETICCTPCT in the last 72 hours. Urinalysis    Component Value Date/Time   COLORURINE STRAW (A) 03/03/2023 0543   APPEARANCEUR CLEAR 03/03/2023 0543   LABSPEC 1.013 03/03/2023 0543   PHURINE 7.0 03/03/2023 0543   GLUCOSEU NEGATIVE 03/03/2023 0543   HGBUR SMALL (A) 03/03/2023 0543   BILIRUBINUR NEGATIVE 03/03/2023 0543   BILIRUBINUR negative 11/10/2019 1432   BILIRUBINUR neg 07/31/2013 1752   KETONESUR NEGATIVE 03/03/2023 0543   PROTEINUR NEGATIVE 03/03/2023 0543   UROBILINOGEN 0.2 11/10/2019 1432   NITRITE NEGATIVE 03/03/2023 0543   LEUKOCYTESUR NEGATIVE 03/03/2023 0543   Sepsis Labs Recent Labs  Lab 07/16/24 2205 07/18/24 0515 07/19/24 1141  WBC 11.9* 7.3 6.3   Microbiology No results found for this or any previous visit (from the past 240 hours).   Time coordinating discharge:  I have spent 35 minutes face to face with the patient and on the ward discussing the patients care, assessment, plan and disposition with other care givers. >50% of the time was devoted counseling the patient about the risks and benefits of treatment/Discharge disposition and coordinating care.   SIGNED:   Burgess JAYSON Dare, MD  [facility] 07/22/2024, 10:06 AM   If 7PM-7AM, please contact night-coverage

## 2024-07-22 NOTE — TOC Transition Note (Signed)
 Transition of Care [facility]) - Discharge Note   Patient Details  Name: [name] MRN: [id number] Date of Birth: Jun 25, 1946  [facility] CM/SW Contact:  Sherline Clack, LCSWA Phone Number: 07/22/2024, 12:04 PM   Clinical Narrative:     Patient will DC to: [facility] Anticipated DC date: 07/22/24  Family notified: [name]/son Transport by: [facility]   Per MD patient ready for DC to [facility]. RN to call report prior to discharge [phone], rm [phone]). RN, patient, patient's family, and facility notified of DC. Discharge Summary and FL2 sent to facility. DC packet on chart. Ambulance transport requested for patient.   CSW will sign off for now as social work intervention is no longer needed. Please consult us  again if new needs arise.    Final next level of care: Skilled Nursing Facility Barriers to Discharge: Barriers Resolved   Patient Goals and CMS Choice     Choice offered to / list presented to : Adult Children, Patient      Discharge Placement              Patient chooses bed at: [facility] Patient to be transferred to facility by: [facility] Name of family member notified: [name]/son Patient and family notified of of transfer: 07/22/24  Discharge Plan and Services Additional resources added to the After Visit Summary for                                       Social Drivers of Health (SDOH) Interventions SDOH Screenings   Food Insecurity: No Food Insecurity (07/17/2024)  Housing: Low Risk  (07/17/2024)  Transportation Needs: No Transportation Needs (07/17/2024)  Utilities: Not At Risk (07/17/2024)  Alcohol Screen: Low Risk  (06/13/2023)  Depression (PHQ2-9): High Risk (06/13/2023)  Financial Resource Strain: Low Risk  (03/23/2021)  Physical Activity: Inactive (06/13/2023)  Social Connections: Unknown (07/17/2024)  Stress: Stress Concern Present (06/13/2023)  Tobacco Use: High Risk (07/17/2024)  Health Literacy: Adequate Health Literacy  (06/13/2023)     Readmission Risk Interventions     No data to display

## 2024-07-22 NOTE — Progress Notes (Signed)
 Called to [facility] (SNF) for report  twice but unable to contact with nurse and left voice message with direct contact number to call for.

## 2024-07-24 DIAGNOSIS — E871 Hypo-osmolality and hyponatremia: Secondary | ICD-10-CM | POA: Diagnosis not present

## 2024-07-24 DIAGNOSIS — E785 Hyperlipidemia, unspecified: Secondary | ICD-10-CM | POA: Diagnosis not present

## 2024-07-24 DIAGNOSIS — F329 Major depressive disorder, single episode, unspecified: Secondary | ICD-10-CM | POA: Diagnosis not present

## 2024-07-24 DIAGNOSIS — F411 Generalized anxiety disorder: Secondary | ICD-10-CM | POA: Diagnosis not present

## 2024-07-28 ENCOUNTER — Ambulatory Visit (INDEPENDENT_AMBULATORY_CARE_PROVIDER_SITE_OTHER): Payer: Self-pay | Admitting: Psychiatry

## 2024-07-28 DIAGNOSIS — Z91199 Patient's noncompliance with other medical treatment and regimen due to unspecified reason: Secondary | ICD-10-CM

## 2024-07-28 NOTE — Progress Notes (Signed)
 No show

## 2024-07-29 DIAGNOSIS — E871 Hypo-osmolality and hyponatremia: Secondary | ICD-10-CM | POA: Diagnosis not present

## 2024-07-29 DIAGNOSIS — F329 Major depressive disorder, single episode, unspecified: Secondary | ICD-10-CM | POA: Diagnosis not present

## 2024-07-29 DIAGNOSIS — E785 Hyperlipidemia, unspecified: Secondary | ICD-10-CM | POA: Diagnosis not present

## 2024-07-29 DIAGNOSIS — F411 Generalized anxiety disorder: Secondary | ICD-10-CM | POA: Diagnosis not present

## 2024-08-01 DIAGNOSIS — L918 Other hypertrophic disorders of the skin: Secondary | ICD-10-CM | POA: Diagnosis not present

## 2024-08-01 DIAGNOSIS — R42 Dizziness and giddiness: Secondary | ICD-10-CM | POA: Diagnosis not present

## 2024-08-04 DIAGNOSIS — G2581 Restless legs syndrome: Secondary | ICD-10-CM | POA: Diagnosis not present

## 2024-08-05 DIAGNOSIS — F329 Major depressive disorder, single episode, unspecified: Secondary | ICD-10-CM | POA: Diagnosis not present

## 2024-08-05 DIAGNOSIS — E785 Hyperlipidemia, unspecified: Secondary | ICD-10-CM | POA: Diagnosis not present

## 2024-08-05 DIAGNOSIS — E871 Hypo-osmolality and hyponatremia: Secondary | ICD-10-CM | POA: Diagnosis not present

## 2024-08-05 DIAGNOSIS — F411 Generalized anxiety disorder: Secondary | ICD-10-CM | POA: Diagnosis not present

## 2024-08-11 ENCOUNTER — Other Ambulatory Visit: Payer: Self-pay | Admitting: *Deleted

## 2024-08-11 NOTE — Patient Outreach (Signed)
  08/11/2024  [name] [birth year] [id number]  Per [facility], [name] resides in [place] skilled nursing facility. [facility] SNF waiver previously utilized for admission to [facility] skilled facility.   Previous update from Deseree, SNF social worker, indicated transition plan is return home with son and caregiver assistance.   Secure message was sent to Deseree today requesting an update on the projected transition date and any identified barriers.   Pablo Hurst, MSN, RN, BSN [place]  [facility], [facility] Commun[facility] RN Post- Acute Care Manager Direct Dial: [phone]

## 2024-08-12 DIAGNOSIS — E871 Hypo-osmolality and hyponatremia: Secondary | ICD-10-CM | POA: Diagnosis not present

## 2024-08-12 DIAGNOSIS — F329 Major depressive disorder, single episode, unspecified: Secondary | ICD-10-CM | POA: Diagnosis not present

## 2024-08-12 DIAGNOSIS — F411 Generalized anxiety disorder: Secondary | ICD-10-CM | POA: Diagnosis not present

## 2024-08-12 DIAGNOSIS — E785 Hyperlipidemia, unspecified: Secondary | ICD-10-CM | POA: Diagnosis not present

## 2024-08-19 DIAGNOSIS — F411 Generalized anxiety disorder: Secondary | ICD-10-CM | POA: Diagnosis not present

## 2024-08-19 DIAGNOSIS — F329 Major depressive disorder, single episode, unspecified: Secondary | ICD-10-CM | POA: Diagnosis not present

## 2024-08-19 DIAGNOSIS — E785 Hyperlipidemia, unspecified: Secondary | ICD-10-CM | POA: Diagnosis not present

## 2024-08-19 DIAGNOSIS — E871 Hypo-osmolality and hyponatremia: Secondary | ICD-10-CM | POA: Diagnosis not present

## 2024-09-08 ENCOUNTER — Other Ambulatory Visit: Payer: Self-pay | Admitting: *Deleted

## 2024-09-08 NOTE — Patient Outreach (Addendum)
" ° °  09/08/2024  [name] [birth year] [id number]   Per [facility], [name] remains in [facility]. [facility] SNF waiver was previously utilized for admission to [facility].   Previous update from [employer] social worker indicated [name] will return home with caregiver assistance. No dc date given at the time.   Secure message sent to Deseree today to request update on transition date and plans.  Addendum: Update from Deseree, Geophysicist/field seismologist. NOMNC was previously issued and family appealed and won appeal. No dc date set yet. Continues to work with therapy. Plans remain to return home with caregivers.   Pablo Hurst, MSN, RN, BSN [place]  [facility], [facility] RN Care Manager Direct Dial: [phone]                "

## 2024-09-30 ENCOUNTER — Other Ambulatory Visit: Payer: Self-pay | Admitting: *Deleted

## 2024-09-30 NOTE — Patient Outreach (Signed)
" ° °  09/30/2024  [name] [birth year] [id number]   [name] remains in [facility] SNF. [facility] SNF waiver was utilized for admission.   Collaboration with [facility] social worker. Care plan meeting scheduled for this week. Barriers to discharge has been recently won appeal by family and progress has been slow.  Anticipated discharge not known yet. Transition plan likely to return home with caregiver assistance.   Will continue to follow.   Pablo Hurst, MSN, RN, BSN [place]  [facility], [facility] Commun[facility] RN Care Manager Direct Dial: [phone]                  "

## 2024-10-10 ENCOUNTER — Emergency Department (HOSPITAL_COMMUNITY)

## 2024-10-10 ENCOUNTER — Emergency Department (HOSPITAL_COMMUNITY)
Admission: EM | Admit: 2024-10-10 | Discharge: 2024-10-11 | Disposition: A | Attending: Emergency Medicine | Admitting: Emergency Medicine

## 2024-10-10 ENCOUNTER — Other Ambulatory Visit: Payer: Self-pay

## 2024-10-10 DIAGNOSIS — D72829 Elevated white blood cell count, unspecified: Secondary | ICD-10-CM | POA: Insufficient documentation

## 2024-10-10 DIAGNOSIS — R1084 Generalized abdominal pain: Secondary | ICD-10-CM

## 2024-10-10 DIAGNOSIS — N189 Chronic kidney disease, unspecified: Secondary | ICD-10-CM | POA: Insufficient documentation

## 2024-10-10 DIAGNOSIS — I129 Hypertensive chronic kidney disease with stage 1 through stage 4 chronic kidney disease, or unspecified chronic kidney disease: Secondary | ICD-10-CM | POA: Insufficient documentation

## 2024-10-10 DIAGNOSIS — Z7982 Long term (current) use of aspirin: Secondary | ICD-10-CM | POA: Insufficient documentation

## 2024-10-10 DIAGNOSIS — Z7902 Long term (current) use of antithrombotics/antiplatelets: Secondary | ICD-10-CM | POA: Insufficient documentation

## 2024-10-10 DIAGNOSIS — J449 Chronic obstructive pulmonary disease, unspecified: Secondary | ICD-10-CM | POA: Insufficient documentation

## 2024-10-10 DIAGNOSIS — Z79899 Other long term (current) drug therapy: Secondary | ICD-10-CM | POA: Insufficient documentation

## 2024-10-10 DIAGNOSIS — K5900 Constipation, unspecified: Secondary | ICD-10-CM | POA: Insufficient documentation

## 2024-10-10 LAB — COMPREHENSIVE METABOLIC PANEL WITH GFR
ALT: 22 U/L (ref 0–44)
AST: 34 U/L (ref 15–41)
Albumin: 4.4 g/dL (ref 3.5–5.0)
Alkaline Phosphatase: 112 U/L (ref 38–126)
Anion gap: 14 (ref 5–15)
BUN: 29 mg/dL — ABNORMAL HIGH (ref 8–23)
CO2: 26 mmol/L (ref 22–32)
Calcium: 9.9 mg/dL (ref 8.9–10.3)
Chloride: 98 mmol/L (ref 98–111)
Creatinine, Ser: 1.17 mg/dL — ABNORMAL HIGH (ref 0.44–1.00)
GFR, Estimated: 47 mL/min — ABNORMAL LOW
Glucose, Bld: 116 mg/dL — ABNORMAL HIGH (ref 70–99)
Potassium: 4.2 mmol/L (ref 3.5–5.1)
Sodium: 139 mmol/L (ref 135–145)
Total Bilirubin: 0.4 mg/dL (ref 0.0–1.2)
Total Protein: 8.6 g/dL — ABNORMAL HIGH (ref 6.5–8.1)

## 2024-10-10 LAB — CBC WITH DIFFERENTIAL/PLATELET
Abs Immature Granulocytes: 0.13 10*3/uL — ABNORMAL HIGH (ref 0.00–0.07)
Basophils Absolute: 0.1 10*3/uL (ref 0.0–0.1)
Basophils Relative: 0 %
Eosinophils Absolute: 0.1 10*3/uL (ref 0.0–0.5)
Eosinophils Relative: 0 %
HCT: 45.3 % (ref 36.0–46.0)
Hemoglobin: 14.3 g/dL (ref 12.0–15.0)
Immature Granulocytes: 1 %
Lymphocytes Relative: 5 %
Lymphs Abs: 1 10*3/uL (ref 0.7–4.0)
MCH: 28.5 pg (ref 26.0–34.0)
MCHC: 31.6 g/dL (ref 30.0–36.0)
MCV: 90.4 fL (ref 80.0–100.0)
Monocytes Absolute: 1.4 10*3/uL — ABNORMAL HIGH (ref 0.1–1.0)
Monocytes Relative: 8 %
Neutro Abs: 16.2 10*3/uL — ABNORMAL HIGH (ref 1.7–7.7)
Neutrophils Relative %: 86 %
Platelets: 313 10*3/uL (ref 150–400)
RBC: 5.01 MIL/uL (ref 3.87–5.11)
RDW: 14.6 % (ref 11.5–15.5)
WBC: 19 10*3/uL — ABNORMAL HIGH (ref 4.0–10.5)
nRBC: 0 % (ref 0.0–0.2)

## 2024-10-10 LAB — LIPASE, BLOOD: Lipase: 101 U/L — ABNORMAL HIGH (ref 11–51)

## 2024-10-10 MED ORDER — SODIUM CHLORIDE 0.9 % IV BOLUS
500.0000 mL | Freq: Once | INTRAVENOUS | Status: AC
  Administered 2024-10-10: 500 mL via INTRAVENOUS

## 2024-10-10 MED ORDER — ONDANSETRON HCL 4 MG/2ML IJ SOLN
4.0000 mg | Freq: Once | INTRAMUSCULAR | Status: AC
  Administered 2024-10-10: 4 mg via INTRAVENOUS
  Filled 2024-10-10: qty 2

## 2024-10-10 MED ORDER — IOHEXOL 350 MG/ML SOLN
75.0000 mL | Freq: Once | INTRAVENOUS | Status: AC | PRN
  Administered 2024-10-11: 75 mL via INTRAVENOUS

## 2024-10-10 NOTE — ED Triage Notes (Signed)
 BIBA from home for generalized abd, N/D, after 5 days of constipation, pt has been taking several laxatives for the past 5 days, with results at 1900, denies abd pain on arrival, pt has abd surgery 1 month and was sent to rehab, just discharged home today

## 2024-10-10 NOTE — ED Provider Notes (Signed)
 " [place] EMERGENCY DEPARTMENT AT [facility] Provider Note   CSN: [id number] Arrival date & time: 10/10/24  2144     Patient presents with: Abdominal Pain   [name] is a 79 y.o. female.  {Add pertinent medical, surgical, social history, OB history to HPI:32947} 79 y/o female with hx of CVA with right-sided hemiparesis, chronic constipation, PAD, HTN, COPD, CKD 3B, GERD, and AAA presents to the ED for constipation and abdominal pain.  She reports that she had not had a bowel movement in 4 to 5 days.  She decided to take a plethora of laxatives at home and had generalized abdominal pain around 1700.  She felt nauseous, but never experienced any vomiting.  Does report having a large bowel movement shortly after her pain started which relieved her discomfort.  She presently has no acute complaints.  Hx of hospitalization 11/6-11/11/25 for abdominal pain 2/2 constipation and ischemic colitis. Had flex sigmoidoscopy on 11/6. Discharged to SNF and was transferred home today. Abdominal SHx significant for tubal ligation.   Abdominal Pain      Prior to Admission medications  Medication Sig Start Date End Date Taking? Authorizing Provider  acetaminophen  (TYLENOL ) 500 MG tablet Take 500 mg by mouth See admin instructions. Give 1 tablet (500mg ) by mouth at bedtime AND give 1 tablet every 4 hours as needed for pain. Not to exceed 3G daily from all sources.    [provider]  albuterol  (VENTOLIN  HFA) 108 (90 Base) MCG/ACT inhaler Inhale 1-2 puffs into the lungs every 6 (six) hours as needed for wheezing or shortness of breath. 05/31/23   Levora Reyes SAUNDERS, MD  amLODipine  (NORVASC ) 5 MG tablet Take 1 tablet (5 mg total) by mouth daily. 02/07/23   Lavona Agent, MD  aspirin  EC 81 MG tablet Take 81 mg by mouth daily.    [provider]  Camphor-Menthol-Methyl Sal (MUSCLE RUB ULTRA STRENGTH) 12-19-28 % CREA Apply 1 application  topically every 4 (four) hours as needed  (pain). apply to shoulder and neck    [provider]  clonazePAM  (KLONOPIN ) 0.5 MG tablet Take 1 tablet (0.5 mg total) by mouth at bedtime. 07/20/24   Amin, Ankit C, MD  clopidogrel  (PLAVIX ) 75 MG tablet Take 1 tablet (75 mg total) by mouth daily with breakfast. Patient taking differently: Take 75 mg by mouth daily. 07/24/23   Christobal Guadalajara, MD  cyanocobalamin (VITAMIN B12) 500 MCG tablet Take 500 mcg by mouth daily.    [provider]  DULoxetine  (CYMBALTA ) 30 MG capsule Take 30 mg by mouth at bedtime.    [provider]  DULoxetine  (CYMBALTA ) 60 MG capsule Take 60 mg by mouth daily.    [provider]  ezetimibe  (ZETIA ) 10 MG tablet Take 1 tablet (10 mg total) by mouth daily. 07/24/23   Christobal Guadalajara, MD  linaclotide  (LINZESS ) 290 MCG CAPS capsule Take 1 capsule (290 mcg total) by mouth daily before breakfast. Patient taking differently: Take 290 mcg by mouth daily. 07/23/23   Christobal Guadalajara, MD  losartan  (COZAAR ) 50 MG tablet Take 50 mg by mouth daily.    [provider]  meclizine  (ANTIVERT ) 25 MG tablet Take 25 mg by mouth every 8 (eight) hours as needed for dizziness.    [provider]  Menthol, Topical Analgesic, (BIOFREEZE COOL THE PAIN) 10 % CREA Apply 1 application  topically daily as needed (spasms). apply to left side of neck    [provider]  Menthol-Zinc  Oxide (  CALMOSEPTINE) 0.44-20.6 % OINT Apply 1 application  topically See admin instructions. Apply topically to buttocks/sacrum three times daily with every shift AND apply with each toileting/incontinent change.    [provider]  polyethylene glycol (MIRALAX  / GLYCOLAX ) 17 g packet Take 17 g by mouth daily. 07/21/24   Amin, Ankit C, MD  rOPINIRole  (REQUIP ) 2 MG tablet TAKE 1 TABLET(2 MG) BY MOUTH AT BEDTIME 03/30/23   Cottle, Lorene KANDICE Raddle., MD  simethicone  (MYLICON) 80 MG chewable tablet Chew 1 tablet (80 mg total) by mouth 4 (four) times daily. Patient taking  differently: Chew 80 mg by mouth every 6 (six) hours as needed for flatulence. 03/07/23   Tobie Yetta HERO, MD    Allergies: Crestor  [rosuvastatin ], Lipitor [atorvastatin ], and Pravastatin    Review of Systems  Gastrointestinal:  Positive for abdominal pain.  Ten systems reviewed and are negative for acute change, except as noted in the HPI.    Updated Vital Signs BP (!) 148/69   Pulse 88   Temp 98.9 F (37.2 C) (Axillary)   Resp 20   Ht 5' 7 (1.702 m)   Wt 78.5 kg   SpO2 100%   BMI 27.11 kg/m   Physical Exam Vitals and nursing note reviewed.  Constitutional:      General: She is not in acute distress.    Appearance: She is well-developed. She is not diaphoretic.     Comments: Elderly female. Nontoxic appearing.  HENT:     Head: Normocephalic and atraumatic.  Eyes:     General: No scleral icterus.    Conjunctiva/sclera: Conjunctivae normal.  Pulmonary:     Effort: Pulmonary effort is normal. No respiratory distress.     Comments: Respirations even and unlabored Abdominal:     Comments: Soft, obese abdomen. No focal TTP.  Musculoskeletal:        General: Normal range of motion.     Cervical back: Normal range of motion.  Skin:    General: Skin is warm and dry.     Coloration: Skin is not pale.     Findings: No erythema or rash.  Neurological:     Mental Status: She is alert and oriented to person, place, and time.  Psychiatric:        Behavior: Behavior normal.     (all labs ordered are listed, but only abnormal results are displayed) Labs Reviewed  CBC WITH DIFFERENTIAL/PLATELET  LIPASE, BLOOD  COMPREHENSIVE METABOLIC PANEL WITH GFR  URINALYSIS, ROUTINE W REFLEX MICROSCOPIC    EKG: None  Radiology: No results found.  {Document cardiac monitor, telemetry assessment procedure when appropriate:32947} Procedures   Medications Ordered in the ED  ondansetron  (ZOFRAN ) injection 4 mg (has no administration in time range)  sodium chloride  0.9 % bolus 500  mL (has no administration in time range)      {Click here for ABCD2, HEART and other calculators REFRESH Note before signing:1}                              Medical Decision Making Amount and/or Complexity of Data Reviewed Labs: ordered. Radiology: ordered.  Risk Prescription drug management.   ***  {Document critical care time when appropriate  Document review of labs and clinical decision tools ie CHADS2VASC2, etc  Document your independent review of radiology images and any outside records  Document your discussion with family members, caretakers and with consultants  Document social determinants of health affecting pt's  care  Document your decision making why or why not admission, treatments were needed:32947:::1}   Final diagnoses:  None    ED Discharge Orders     None        "

## 2024-10-11 ENCOUNTER — Encounter (HOSPITAL_COMMUNITY): Payer: Self-pay

## 2024-10-11 NOTE — ED Notes (Signed)
 [facility] is here for pick-up.

## 2024-10-11 NOTE — Discharge Instructions (Signed)
 Your workup in the emergency department today did not show any acute change on your abdominal imaging.  Continue your daily prescribed medications.  Follow-up with your primary doctor for reassessment.  You may return for new or concerning symptoms.

## 2024-10-11 NOTE — ED Notes (Signed)
 Call placed to [facility], listed in pt contact, as pt stated this would be the person that would be able to unlock her house for EMS to get in. Nino provided number of aide that sits with pt that was still inside the house and will be able to unlock door for EMS, [phone]. Aide states she will be able to unlock door and is still present at pt residence.

## 2024-10-14 ENCOUNTER — Other Ambulatory Visit: Payer: Self-pay | Admitting: *Deleted

## 2024-10-14 NOTE — Patient Outreach (Signed)
 [facility]  10/14/2024  [name] Mar 02, 1946 [id number]  [name] was recently skilled at [facility] under [facility] SN[facility] waiver.   Secure message sent to Deseree, SNF social worker to confirmation discharge date from [place].   Pablo Hurst, MSN, RN, BSN [place]  [facility], [facility] Communities RN Care Manager Direct Dial: [phone]
